# Patient Record
Sex: Female | Born: 1950 | Race: Black or African American | Hispanic: No
Health system: Southern US, Community
[De-identification: ages and names within clinical notes are randomized; demographics above are authoritative.]

## PROBLEM LIST (undated history)

## (undated) DIAGNOSIS — M1990 Unspecified osteoarthritis, unspecified site: Secondary | ICD-10-CM

## (undated) DIAGNOSIS — E119 Type 2 diabetes mellitus without complications: Secondary | ICD-10-CM

## (undated) DIAGNOSIS — D472 Monoclonal gammopathy: Secondary | ICD-10-CM

## (undated) DIAGNOSIS — R000 Tachycardia, unspecified: Secondary | ICD-10-CM

## (undated) DIAGNOSIS — D649 Anemia, unspecified: Secondary | ICD-10-CM

## (undated) DIAGNOSIS — U071 COVID-19: Secondary | ICD-10-CM

## (undated) DIAGNOSIS — K219 Gastro-esophageal reflux disease without esophagitis: Secondary | ICD-10-CM

## (undated) DIAGNOSIS — I10 Essential (primary) hypertension: Secondary | ICD-10-CM

## (undated) DIAGNOSIS — K589 Irritable bowel syndrome without diarrhea: Secondary | ICD-10-CM

## (undated) DIAGNOSIS — Z8719 Personal history of other diseases of the digestive system: Secondary | ICD-10-CM

## (undated) DIAGNOSIS — E7800 Pure hypercholesterolemia, unspecified: Secondary | ICD-10-CM

## (undated) DIAGNOSIS — H8390 Unspecified disease of inner ear, unspecified ear: Secondary | ICD-10-CM

## (undated) DIAGNOSIS — J449 Chronic obstructive pulmonary disease, unspecified: Secondary | ICD-10-CM

## (undated) DIAGNOSIS — M797 Fibromyalgia: Secondary | ICD-10-CM

## (undated) DIAGNOSIS — I4711 Inappropriate sinus tachycardia, so stated: Secondary | ICD-10-CM

## (undated) DIAGNOSIS — E0590 Thyrotoxicosis, unspecified without thyrotoxic crisis or storm: Secondary | ICD-10-CM

## (undated) DIAGNOSIS — I82403 Acute embolism and thrombosis of unspecified deep veins of lower extremity, bilateral: Secondary | ICD-10-CM

## (undated) DIAGNOSIS — Z9889 Other specified postprocedural states: Secondary | ICD-10-CM

## (undated) DIAGNOSIS — R112 Nausea with vomiting, unspecified: Secondary | ICD-10-CM

## (undated) HISTORY — DX: Inappropriate sinus tachycardia, so stated: I47.11

## (undated) HISTORY — PX: EYE SURGERY: SHX253

## (undated) HISTORY — PX: WRIST GANGLION EXCISION: SUR520

## (undated) HISTORY — DX: Tachycardia, unspecified: R00.0

## (undated) HISTORY — PX: DENTAL SURGERY: SHX609

## (undated) HISTORY — DX: COVID-19: U07.1

## (undated) HISTORY — PX: ABDOMINAL HYSTERECTOMY: SHX81

## (undated) HISTORY — DX: Type 2 diabetes mellitus without complications: E11.9

## (undated) HISTORY — DX: Acute embolism and thrombosis of unspecified deep veins of lower extremity, bilateral: I82.403

## (undated) HISTORY — DX: Monoclonal gammopathy: D47.2

---
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
## 1989-02-16 HISTORY — PX: CARPAL TUNNEL RELEASE: SHX101

## 2000-06-13 ENCOUNTER — Encounter: Payer: Self-pay | Admitting: Emergency Medicine

## 2000-06-13 ENCOUNTER — Emergency Department (HOSPITAL_COMMUNITY): Admission: EM | Admit: 2000-06-13 | Discharge: 2000-06-13 | Payer: Self-pay | Admitting: Emergency Medicine

## 2000-11-07 ENCOUNTER — Emergency Department (HOSPITAL_COMMUNITY): Admission: EM | Admit: 2000-11-07 | Discharge: 2000-11-07 | Payer: Self-pay | Admitting: *Deleted

## 2000-12-12 ENCOUNTER — Emergency Department (HOSPITAL_COMMUNITY): Admission: EM | Admit: 2000-12-12 | Discharge: 2000-12-12 | Payer: Self-pay | Admitting: Emergency Medicine

## 2000-12-12 ENCOUNTER — Encounter: Payer: Self-pay | Admitting: Emergency Medicine

## 2001-09-28 ENCOUNTER — Emergency Department (HOSPITAL_COMMUNITY): Admission: EM | Admit: 2001-09-28 | Discharge: 2001-09-28 | Payer: Self-pay | Admitting: Emergency Medicine

## 2002-02-21 ENCOUNTER — Emergency Department (HOSPITAL_COMMUNITY): Admission: EM | Admit: 2002-02-21 | Discharge: 2002-02-22 | Payer: Self-pay | Admitting: Emergency Medicine

## 2003-02-08 ENCOUNTER — Emergency Department (HOSPITAL_COMMUNITY): Admission: EM | Admit: 2003-02-08 | Discharge: 2003-02-08 | Payer: Self-pay | Admitting: Emergency Medicine

## 2003-04-09 ENCOUNTER — Emergency Department (HOSPITAL_COMMUNITY): Admission: EM | Admit: 2003-04-09 | Discharge: 2003-04-09 | Payer: Self-pay | Admitting: Emergency Medicine

## 2003-04-24 ENCOUNTER — Emergency Department (HOSPITAL_COMMUNITY): Admission: EM | Admit: 2003-04-24 | Discharge: 2003-04-24 | Payer: Self-pay | Admitting: Emergency Medicine

## 2004-02-11 ENCOUNTER — Emergency Department (HOSPITAL_COMMUNITY): Admission: EM | Admit: 2004-02-11 | Discharge: 2004-02-11 | Payer: Self-pay | Admitting: Emergency Medicine

## 2004-03-09 ENCOUNTER — Emergency Department (HOSPITAL_COMMUNITY): Admission: EM | Admit: 2004-03-09 | Discharge: 2004-03-09 | Payer: Self-pay | Admitting: Emergency Medicine

## 2004-04-19 ENCOUNTER — Emergency Department (HOSPITAL_COMMUNITY): Admission: EM | Admit: 2004-04-19 | Discharge: 2004-04-20 | Payer: Self-pay | Admitting: Emergency Medicine

## 2004-04-29 ENCOUNTER — Encounter (HOSPITAL_COMMUNITY): Admission: RE | Admit: 2004-04-29 | Discharge: 2004-05-29 | Payer: Self-pay | Admitting: Preventative Medicine

## 2004-11-17 ENCOUNTER — Emergency Department (HOSPITAL_COMMUNITY): Admission: EM | Admit: 2004-11-17 | Discharge: 2004-11-17 | Payer: Self-pay | Admitting: Emergency Medicine

## 2004-12-01 ENCOUNTER — Emergency Department (HOSPITAL_COMMUNITY): Admission: EM | Admit: 2004-12-01 | Discharge: 2004-12-01 | Payer: Self-pay | Admitting: Emergency Medicine

## 2005-03-03 ENCOUNTER — Ambulatory Visit (HOSPITAL_COMMUNITY): Admission: RE | Admit: 2005-03-03 | Discharge: 2005-03-03 | Payer: Self-pay | Admitting: Family Medicine

## 2006-01-27 ENCOUNTER — Emergency Department (HOSPITAL_COMMUNITY): Admission: EM | Admit: 2006-01-27 | Discharge: 2006-01-27 | Payer: Self-pay | Admitting: Emergency Medicine

## 2006-03-12 ENCOUNTER — Emergency Department (HOSPITAL_COMMUNITY): Admission: EM | Admit: 2006-03-12 | Discharge: 2006-03-12 | Payer: Self-pay | Admitting: Emergency Medicine

## 2007-02-09 ENCOUNTER — Emergency Department (HOSPITAL_COMMUNITY): Admission: EM | Admit: 2007-02-09 | Discharge: 2007-02-09 | Payer: Self-pay | Admitting: Emergency Medicine

## 2008-08-21 ENCOUNTER — Ambulatory Visit (HOSPITAL_COMMUNITY): Admission: RE | Admit: 2008-08-21 | Discharge: 2008-08-21 | Payer: Self-pay | Admitting: Family Medicine

## 2009-02-11 ENCOUNTER — Emergency Department (HOSPITAL_COMMUNITY): Admission: EM | Admit: 2009-02-11 | Discharge: 2009-02-11 | Payer: Self-pay | Admitting: Emergency Medicine

## 2009-11-29 ENCOUNTER — Emergency Department (HOSPITAL_COMMUNITY): Admission: EM | Admit: 2009-11-29 | Discharge: 2009-11-29 | Payer: Self-pay | Admitting: Emergency Medicine

## 2010-05-19 LAB — GLUCOSE, CAPILLARY: Glucose-Capillary: 215 mg/dL — ABNORMAL HIGH (ref 70–99)

## 2010-08-22 ENCOUNTER — Other Ambulatory Visit: Payer: Self-pay

## 2010-08-22 ENCOUNTER — Emergency Department (HOSPITAL_COMMUNITY): Payer: Self-pay

## 2010-08-22 ENCOUNTER — Emergency Department (HOSPITAL_COMMUNITY)
Admission: EM | Admit: 2010-08-22 | Discharge: 2010-08-22 | Disposition: A | Discharge status: Home / Self Care | Payer: Self-pay | Attending: Emergency Medicine | Admitting: Emergency Medicine

## 2010-08-22 DIAGNOSIS — N289 Disorder of kidney and ureter, unspecified: Secondary | ICD-10-CM | POA: Insufficient documentation

## 2010-08-22 DIAGNOSIS — R5381 Other malaise: Secondary | ICD-10-CM | POA: Insufficient documentation

## 2010-08-22 DIAGNOSIS — E119 Type 2 diabetes mellitus without complications: Secondary | ICD-10-CM | POA: Insufficient documentation

## 2010-08-22 DIAGNOSIS — I10 Essential (primary) hypertension: Secondary | ICD-10-CM | POA: Insufficient documentation

## 2010-08-22 DIAGNOSIS — Z794 Long term (current) use of insulin: Secondary | ICD-10-CM | POA: Insufficient documentation

## 2010-08-22 DIAGNOSIS — Z7982 Long term (current) use of aspirin: Secondary | ICD-10-CM | POA: Insufficient documentation

## 2010-08-22 DIAGNOSIS — Z79899 Other long term (current) drug therapy: Secondary | ICD-10-CM | POA: Insufficient documentation

## 2010-08-22 DIAGNOSIS — E860 Dehydration: Secondary | ICD-10-CM | POA: Insufficient documentation

## 2010-08-22 DIAGNOSIS — F17200 Nicotine dependence, unspecified, uncomplicated: Secondary | ICD-10-CM | POA: Insufficient documentation

## 2010-08-22 DIAGNOSIS — R5383 Other fatigue: Secondary | ICD-10-CM | POA: Insufficient documentation

## 2010-08-22 LAB — COMPREHENSIVE METABOLIC PANEL
ALT: 31 U/L (ref 0–35)
BUN: 26 mg/dL — ABNORMAL HIGH (ref 6–23)
CO2: 27 mEq/L (ref 19–32)
Calcium: 9 mg/dL (ref 8.4–10.5)
Creatinine, Ser: 1.41 mg/dL — ABNORMAL HIGH (ref 0.50–1.10)
GFR calc Af Amer: 46 mL/min — ABNORMAL LOW (ref >60)
GFR calc non Af Amer: 38 mL/min — ABNORMAL LOW (ref >60)
Glucose, Bld: 304 mg/dL — ABNORMAL HIGH (ref 70–99)

## 2010-08-22 LAB — CK TOTAL AND CKMB (NOT AT ARMC)
CK, MB: 3.3 ng/mL (ref 0.3–4.0)
Total CK: 139 U/L (ref 7–177)

## 2010-08-22 LAB — GLUCOSE, CAPILLARY: Glucose-Capillary: 397 mg/dL — ABNORMAL HIGH (ref 70–99)

## 2010-08-22 LAB — DIFFERENTIAL
Basophils Absolute: 0 10*3/uL (ref 0.0–0.1)
Basophils Relative: 0 % (ref 0–1)
Eosinophils Absolute: 0 10*3/uL (ref 0.0–0.7)
Monocytes Absolute: 0.6 10*3/uL (ref 0.1–1.0)
Monocytes Relative: 6 % (ref 3–12)

## 2010-08-22 LAB — CBC
MCH: 28.1 pg (ref 26.0–34.0)
MCHC: 33 g/dL (ref 30.0–36.0)
Platelets: 258 10*3/uL (ref 150–400)
RDW: 13.9 % (ref 11.5–15.5)

## 2010-09-19 ENCOUNTER — Encounter: Payer: Self-pay | Admitting: *Deleted

## 2010-09-19 ENCOUNTER — Emergency Department (HOSPITAL_COMMUNITY)
Admission: EM | Admit: 2010-09-19 | Discharge: 2010-09-19 | Disposition: A | Discharge status: Home / Self Care | Payer: Self-pay | Attending: Emergency Medicine | Admitting: Emergency Medicine

## 2010-09-19 DIAGNOSIS — E119 Type 2 diabetes mellitus without complications: Secondary | ICD-10-CM | POA: Insufficient documentation

## 2010-09-19 DIAGNOSIS — L02419 Cutaneous abscess of limb, unspecified: Secondary | ICD-10-CM

## 2010-09-19 DIAGNOSIS — J45909 Unspecified asthma, uncomplicated: Secondary | ICD-10-CM | POA: Insufficient documentation

## 2010-09-19 DIAGNOSIS — F17200 Nicotine dependence, unspecified, uncomplicated: Secondary | ICD-10-CM | POA: Insufficient documentation

## 2010-09-19 DIAGNOSIS — IMO0001 Reserved for inherently not codable concepts without codable children: Secondary | ICD-10-CM | POA: Insufficient documentation

## 2010-09-19 DIAGNOSIS — IMO0002 Reserved for concepts with insufficient information to code with codable children: Secondary | ICD-10-CM | POA: Insufficient documentation

## 2010-09-19 DIAGNOSIS — I10 Essential (primary) hypertension: Secondary | ICD-10-CM | POA: Insufficient documentation

## 2010-09-19 HISTORY — DX: Essential (primary) hypertension: I10

## 2010-09-19 HISTORY — DX: Fibromyalgia: M79.7

## 2010-09-19 MED ORDER — HYDROCODONE-ACETAMINOPHEN 7.5-325 MG PO TABS: 1.0000 | ORAL_TABLET | ORAL | Status: AC | PRN

## 2010-09-19 MED ORDER — CLINDAMYCIN HCL 150 MG PO CAPS: ORAL_CAPSULE | ORAL | Status: DC

## 2010-09-19 MED ORDER — LIDOCAINE HCL 2 % IJ SOLN
10.0000 mL | Freq: Once | INTRAMUSCULAR | Status: AC
  Administered 2010-09-19: 200 mg
  Filled 2010-09-19: qty 1

## 2010-09-19 NOTE — ED Notes (Signed)
Pt a/ox4. Resp even and unlabored. NAD at this time. D/C instructions reviewed with pt. Pt verbalized understanding. Pt escorted to d/c desk. Pt ambulated with steady gate. 

## 2010-09-19 NOTE — ED Provider Notes (Signed)
History     CSN: [id number] Arrival date & time: 09/19/2010  5:02 PM  Chief Complaint  Patient presents with  . Abscess   Patient is a 60 y.o. female presenting with abscess. The history is provided by the patient.  Abscess  This is a new problem. The current episode started more than one week ago. The problem has been gradually worsening. Affected Location: right elbow. The problem is moderate. The abscess is characterized by painfulness. Associated symptoms include decreased sleep. Pertinent negatives include no anorexia, no fever and no cough. There were no sick contacts.    Past Medical History  Diagnosis Date  . Diabetes mellitus   . Hypertension   . Fibromyalgia   . Asthma     Past Surgical History  Procedure Date  . Cholecystectomy   . Abdominal hysterectomy partial    No family history on file.  History  Substance Use Topics  . Smoking status: Current Everyday Smoker  . Smokeless tobacco: Not on file  . Alcohol Use: No    OB History    Grav Para Term Preterm Abortions TAB SAB Ect Mult Living                  Review of Systems  Constitutional: Negative for fever and activity change.       All ROS Neg except as noted in HPI  HENT: Negative for nosebleeds and neck pain.   Eyes: Negative for photophobia and discharge.  Respiratory: Negative for cough, shortness of breath and wheezing.   Cardiovascular: Negative for chest pain and palpitations.  Gastrointestinal: Negative for abdominal pain, blood in stool and anorexia.  Genitourinary: Negative for dysuria, frequency and hematuria.  Musculoskeletal: Negative for back pain and arthralgias.  Skin: Negative.   Neurological: Negative for dizziness, seizures and speech difficulty.  Psychiatric/Behavioral: Negative for hallucinations and confusion.    Physical Exam  BP 114/78  Pulse 104  Temp(Src) 98.4 F (36.9 C) (Oral)  Resp 20  Ht 5' 8.5" (1.74 m)  Wt 224 lb (101.606 kg)  BMI 33.56 kg/m2  SpO2  100%  Physical Exam  Nursing note and vitals reviewed. Constitutional: She is oriented to person, place, and time. She appears well-developed and well-nourished.  Non-toxic appearance.  HENT:  Head: Normocephalic.  Right Ear: Tympanic membrane and external ear normal.  Left Ear: Tympanic membrane and external ear normal.  Eyes: EOM and lids are normal. Pupils are equal, round, and reactive to light.  Neck: Normal range of motion. Neck supple. Carotid bruit is not present.  Cardiovascular: Normal rate, regular rhythm, normal heart sounds, intact distal pulses and normal pulses.   Pulmonary/Chest: Breath sounds normal. No respiratory distress.  Abdominal: Soft. Bowel sounds are normal. There is no tenderness. There is no guarding.  Musculoskeletal:       Abscess of the right forearm/elbow area. Mild to mod increase redness. Warm but not hot. FROM of the finger and wrist.. Good cap refill.  Lymphadenopathy:       Head (right side): No submandibular adenopathy present.       Head (left side): No submandibular adenopathy present.    She has no cervical adenopathy.  Neurological: She is alert and oriented to person, place, and time. She has normal strength. No cranial nerve deficit or sensory deficit.  Skin: Skin is warm and dry.  Psychiatric: She has a normal mood and affect. Her speech is normal.    ED Course  Apiration of blood/fluid Date/Time: 09/19/2010 6:03 PM  Performed by: Kathie Dike Authorized by: Gerhard Munch Consent: Verbal consent obtained. Consent given by: patient Patient understanding: patient states understanding of the procedure being performed Patient identity confirmed: arm band Time out: Immediately prior to procedure a "time out" was called to verify the correct patient, procedure, equipment, support staff and site/side marked as required. Preparation: Patient was prepped and draped in the usual sterile fashion. Local anesthesia used: yes Anesthesia: local  infiltration Local anesthetic: lidocaine 2% without epinephrine Patient tolerance: Patient tolerated the procedure well with no immediate complications. Comments: The abscess area of the right elbow was aspirated. No pus-like material noted. Sterile bandage applied by me. Antibiotics, NSAId and pain meds ordered.    MDM I have reviewed nursing notes, vital signs, and all appropriate lab and imaging results for this patient.      Kathie Dike, PA 09/19/10 1728  Kathie Dike, [place] 09/19/10 [phone]

## 2010-09-19 NOTE — ED Notes (Signed)
C/o abscess to right elbow x 1 1/2 wks.  States area "popped and drained" x 2 days ago.  Since, experiencing increased throbbing pain to area.

## 2010-11-21 LAB — URINE MICROSCOPIC-ADD ON

## 2010-11-21 LAB — URINALYSIS, ROUTINE W REFLEX MICROSCOPIC
Bilirubin Urine: NEGATIVE
Leukocytes, UA: NEGATIVE
Nitrite: NEGATIVE
Specific Gravity, Urine: 1.02
Urobilinogen, UA: 0.2

## 2011-10-28 ENCOUNTER — Other Ambulatory Visit (HOSPITAL_COMMUNITY): Payer: Self-pay | Admitting: Family Medicine

## 2011-10-28 DIAGNOSIS — Z1231 Encounter for screening mammogram for malignant neoplasm of breast: Secondary | ICD-10-CM

## 2011-10-29 ENCOUNTER — Other Ambulatory Visit (HOSPITAL_COMMUNITY): Payer: Self-pay | Admitting: Family Medicine

## 2011-10-29 DIAGNOSIS — Z1231 Encounter for screening mammogram for malignant neoplasm of breast: Secondary | ICD-10-CM

## 2011-11-04 ENCOUNTER — Ambulatory Visit (HOSPITAL_COMMUNITY): Payer: Self-pay

## 2011-11-09 ENCOUNTER — Ambulatory Visit (HOSPITAL_COMMUNITY)
Admission: RE | Admit: 2011-11-09 | Discharge: 2011-11-09 | Disposition: A | Discharge status: Home / Self Care | Payer: Self-pay | Source: Ambulatory Visit | Attending: Family Medicine | Admitting: Family Medicine

## 2011-11-09 DIAGNOSIS — Z1231 Encounter for screening mammogram for malignant neoplasm of breast: Secondary | ICD-10-CM

## 2011-11-17 ENCOUNTER — Other Ambulatory Visit: Payer: Self-pay | Admitting: Family Medicine

## 2011-11-17 DIAGNOSIS — R928 Other abnormal and inconclusive findings on diagnostic imaging of breast: Secondary | ICD-10-CM

## 2011-11-24 ENCOUNTER — Other Ambulatory Visit (HOSPITAL_COMMUNITY): Payer: Self-pay | Admitting: Nurse Practitioner

## 2011-12-09 ENCOUNTER — Ambulatory Visit (HOSPITAL_COMMUNITY)
Admission: RE | Admit: 2011-12-09 | Discharge: 2011-12-09 | Disposition: A | Discharge status: Home / Self Care | Payer: PRIVATE HEALTH INSURANCE | Source: Ambulatory Visit | Attending: Nurse Practitioner | Admitting: Nurse Practitioner

## 2011-12-09 DIAGNOSIS — R928 Other abnormal and inconclusive findings on diagnostic imaging of breast: Secondary | ICD-10-CM | POA: Insufficient documentation

## 2012-08-25 ENCOUNTER — Other Ambulatory Visit (HOSPITAL_COMMUNITY): Payer: Self-pay | Admitting: Family Medicine

## 2012-08-25 ENCOUNTER — Ambulatory Visit (HOSPITAL_COMMUNITY)
Admission: RE | Admit: 2012-08-25 | Discharge: 2012-08-25 | Disposition: A | Discharge status: Home / Self Care | Payer: Disability Insurance | Source: Ambulatory Visit | Attending: Family Medicine | Admitting: Family Medicine

## 2012-08-25 DIAGNOSIS — M25512 Pain in left shoulder: Secondary | ICD-10-CM

## 2012-08-25 DIAGNOSIS — M25511 Pain in right shoulder: Secondary | ICD-10-CM

## 2012-08-25 DIAGNOSIS — M545 Low back pain: Secondary | ICD-10-CM

## 2012-08-25 DIAGNOSIS — M25519 Pain in unspecified shoulder: Secondary | ICD-10-CM | POA: Insufficient documentation

## 2012-08-25 DIAGNOSIS — M898X9 Other specified disorders of bone, unspecified site: Secondary | ICD-10-CM | POA: Insufficient documentation

## 2013-01-20 ENCOUNTER — Encounter (HOSPITAL_COMMUNITY): Payer: Self-pay | Admitting: Emergency Medicine

## 2013-01-20 ENCOUNTER — Emergency Department (HOSPITAL_COMMUNITY)
Admission: EM | Admit: 2013-01-20 | Discharge: 2013-01-21 | Disposition: A | Discharge status: Home / Self Care | Payer: BC Managed Care – PPO | Attending: Emergency Medicine | Admitting: Emergency Medicine

## 2013-01-20 DIAGNOSIS — J45909 Unspecified asthma, uncomplicated: Secondary | ICD-10-CM | POA: Insufficient documentation

## 2013-01-20 DIAGNOSIS — Z880 Allergy status to penicillin: Secondary | ICD-10-CM | POA: Insufficient documentation

## 2013-01-20 DIAGNOSIS — Z79899 Other long term (current) drug therapy: Secondary | ICD-10-CM | POA: Insufficient documentation

## 2013-01-20 DIAGNOSIS — Z7982 Long term (current) use of aspirin: Secondary | ICD-10-CM | POA: Insufficient documentation

## 2013-01-20 DIAGNOSIS — K1121 Acute sialoadenitis: Secondary | ICD-10-CM

## 2013-01-20 DIAGNOSIS — N289 Disorder of kidney and ureter, unspecified: Secondary | ICD-10-CM

## 2013-01-20 DIAGNOSIS — E119 Type 2 diabetes mellitus without complications: Secondary | ICD-10-CM | POA: Insufficient documentation

## 2013-01-20 DIAGNOSIS — D649 Anemia, unspecified: Secondary | ICD-10-CM

## 2013-01-20 DIAGNOSIS — K1120 Sialoadenitis, unspecified: Secondary | ICD-10-CM | POA: Insufficient documentation

## 2013-01-20 DIAGNOSIS — Z87891 Personal history of nicotine dependence: Secondary | ICD-10-CM | POA: Insufficient documentation

## 2013-01-20 DIAGNOSIS — E871 Hypo-osmolality and hyponatremia: Secondary | ICD-10-CM | POA: Insufficient documentation

## 2013-01-20 DIAGNOSIS — I10 Essential (primary) hypertension: Secondary | ICD-10-CM | POA: Insufficient documentation

## 2013-01-20 DIAGNOSIS — Z794 Long term (current) use of insulin: Secondary | ICD-10-CM | POA: Insufficient documentation

## 2013-01-20 NOTE — ED Notes (Signed)
Pt c/o left side of face swelling since yesterday. Pt states getting worse today and has gotten really painful in the last hour.

## 2013-01-21 ENCOUNTER — Emergency Department (HOSPITAL_COMMUNITY): Payer: BC Managed Care – PPO

## 2013-01-21 LAB — BASIC METABOLIC PANEL
Calcium: 9.5 mg/dL (ref 8.4–10.5)
GFR calc Af Amer: 39 mL/min — ABNORMAL LOW (ref >90)
GFR calc non Af Amer: 33 mL/min — ABNORMAL LOW (ref >90)
Sodium: 130 mEq/L — ABNORMAL LOW (ref 135–145)

## 2013-01-21 LAB — CBC WITH DIFFERENTIAL/PLATELET
Basophils Absolute: 0 10*3/uL (ref 0.0–0.1)
Basophils Relative: 0 % (ref 0–1)
Eosinophils Absolute: 0.3 10*3/uL (ref 0.0–0.7)
Eosinophils Relative: 3 % (ref 0–5)
Lymphocytes Relative: 21 % (ref 12–46)
MCH: 27.1 pg (ref 26.0–34.0)
MCHC: 32.1 g/dL (ref 30.0–36.0)
MCV: 84.7 fL (ref 78.0–100.0)
Platelets: 253 10*3/uL (ref 150–400)
RDW: 14.3 % (ref 11.5–15.5)
WBC: 10.9 10*3/uL — ABNORMAL HIGH (ref 4.0–10.5)

## 2013-01-21 MED ORDER — CLINDAMYCIN HCL 150 MG PO CAPS: 300.0000 mg | ORAL_CAPSULE | Freq: Three times a day (TID) | ORAL | Status: DC

## 2013-01-21 MED ORDER — ONDANSETRON HCL 4 MG/2ML IJ SOLN
4.0000 mg | Freq: Once | INTRAMUSCULAR | Status: AC
  Administered 2013-01-21: 4 mg via INTRAVENOUS
  Filled 2013-01-21: qty 2

## 2013-01-21 MED ORDER — IOHEXOL 300 MG/ML  SOLN
100.0000 mL | Freq: Once | INTRAMUSCULAR | Status: AC | PRN
  Administered 2013-01-21: 100 mL via INTRAVENOUS

## 2013-01-21 MED ORDER — CLINDAMYCIN PHOSPHATE 600 MG/50ML IV SOLN
600.0000 mg | Freq: Once | INTRAVENOUS | Status: AC
  Administered 2013-01-21: 600 mg via INTRAVENOUS
  Filled 2013-01-21: qty 50

## 2013-01-21 MED ORDER — HYDROMORPHONE HCL PF 1 MG/ML IJ SOLN
1.0000 mg | Freq: Once | INTRAMUSCULAR | Status: AC
  Administered 2013-01-21: 1 mg via INTRAVENOUS
  Filled 2013-01-21: qty 1

## 2013-01-21 MED ORDER — OXYCODONE-ACETAMINOPHEN 5-325 MG PO TABS: 1.0000 | ORAL_TABLET | ORAL | Status: DC | PRN

## 2013-01-21 NOTE — ED Provider Notes (Signed)
CSN: [id number]     Arrival date & time 01/20/13  2300 History   First MD Initiated Contact with Patient 01/21/13 0031     Chief Complaint  Patient presents with  . Facial Swelling   (Consider location/radiation/quality/duration/timing/severity/associated sxs/prior Treatment) The history is provided by the patient.   62 year old female noted swelling and the left angle of the mandible yesterday which got worse today and became very painful today. She rates pain at 10/10. Is worse with trying to open her mouth or chew and worse with palpation. Nothing makes it better. She took a dose of hydrocodone-acetaminophen with no relief of pain. She denies fever, chills, sweats. She denies sore throat, nausea, vomiting, diarrhea, arthralgias, myalgias.  Past Medical History  Diagnosis Date  . Diabetes mellitus   . Hypertension   . Fibromyalgia   . Asthma    Past Surgical History  Procedure Laterality Date  . Cholecystectomy    . Abdominal hysterectomy  partial   History reviewed. No pertinent family history. History  Substance Use Topics  . Smoking status: Former Research scientist (life sciences)  . Smokeless tobacco: Not on file  . Alcohol Use: No   OB History   Grav Para Term Preterm Abortions TAB SAB Ect Mult Living                 Review of Systems  All other systems reviewed and are negative.    Allergies  Tetracyclines & related and Penicillins  Home Medications   Current Outpatient Rx  Name  Route  Sig  Dispense  Refill  . Albuterol Sulfate (PROVENTIL HFA IN)   Inhalation   Inhale 2 puffs into the lungs daily.           Marland Kitchen aspirin EC 81 MG tablet   Oral   Take 81 mg by mouth daily.           . Bimatoprost (LUMIGAN OP)   Ophthalmic   Apply 1 drop to eye at bedtime.           . Calcium Acetate, Phos Binder, (CALCIUM ACETATE PO)   Oral   Take 1 tablet by mouth daily.           . Cholecalciferol (VITAMIN D) 1000 UNITS capsule   Oral   Take 1,000 Units by mouth daily.            . clindamycin (CLEOCIN) 150 MG capsule      2 po bid with food   28 capsule   0   . Cyanocobalamin (VITAMIN B-12 PO)   Oral   Take 1 tablet by mouth daily.           Marland Kitchen gabapentin (NEURONTIN) 300 MG capsule   Oral   Take 300 mg by mouth 3 (three) times daily.           Marland Kitchen ibuprofen (ADVIL,MOTRIN) 800 MG tablet   Oral   Take 800 mg by mouth every 6 (six) hours as needed.           . insulin glargine (LANTUS) 100 UNIT/ML injection   Subcutaneous   Inject 26 Units into the skin at bedtime.           . lidocaine (LIDODERM) 5 %   Transdermal   Place 1 patch onto the skin daily. Remove & Discard patch within 12 hours or as directed by MD          . metFORMIN (GLUCOPHAGE) 1000 MG tablet   Oral  Take 1,000 mg by mouth 2 (two) times daily with a meal.           . Multiple Vitamin (MULTIVITAMIN PO)   Oral   Take 1 tablet by mouth daily.           Marland Kitchen PRESCRIPTION MEDICATION   Oral   Take 1 tablet by mouth daily. BLOOD PRESSURE MEDICATION: Owens Shark Tablet/NAME UNKNOWN          . traMADol (ULTRAM) 50 MG tablet   Oral   Take 100 mg by mouth every 4 (four) hours.           Marland Kitchen VITAMIN E BLEND PO   Oral   Take 1 capsule by mouth daily.            BP 148/76  Pulse 112  Temp(Src) 98 F (36.7 C) (Oral)  Resp 26  Ht 5' 8.5" (1.74 m)  Wt 225 lb (102.059 kg)  BMI 33.71 kg/m2  SpO2 100% Physical Exam  Nursing note and vitals reviewed.  62 year old female, who appears uncomfortable, but is in no acute distress. Vital signs are significant for tachycardia with heart rate 112, and tachypnea with respiratory rate of 26. Oxygen saturation is 100%, which is normal. Head is normocephalic and atraumatic. PERRLA, EOMI. Oropharynx is clear. There is soft tissue swelling at the left angle of the mandible with 5 cm x 3.5 cm mass which is exquisitely tender consistent with a parotid abscess. Neck is nontender and supple without adenopathy or JVD. Back is nontender and there  is no CVA tenderness. Lungs are clear without rales, wheezes, or rhonchi. Chest is nontender. Heart has regular rate and rhythm without murmur. Abdomen is soft, flat, nontender without masses or hepatosplenomegaly and peristalsis is normoactive. Extremities have no cyanosis or edema, full range of motion is present. Skin is warm and dry without rash. Neurologic: Mental status is normal, cranial nerves are intact, there are no motor or sensory deficits.  ED Course  Procedures (including critical care time) Labs Review Results for orders placed during the hospital encounter of 01/20/13  CBC WITH DIFFERENTIAL      Result Value Range   WBC 10.9 (*) 4.0 - 10.5 K/uL   RBC 4.31  3.87 - 5.11 MIL/uL   Hemoglobin 11.7 (*) 12.0 - 15.0 g/dL   HCT 36.5  36.0 - 46.0 %   MCV 84.7  78.0 - 100.0 fL   MCH 27.1  26.0 - 34.0 pg   MCHC 32.1  30.0 - 36.0 g/dL   RDW 14.3  11.5 - 15.5 %   Platelets 253  150 - 400 K/uL   Neutrophils Relative % 69  43 - 77 %   Neutro Abs 7.6  1.7 - 7.7 K/uL   Lymphocytes Relative 21  12 - 46 %   Lymphs Abs 2.3  0.7 - 4.0 K/uL   Monocytes Relative 7  3 - 12 %   Monocytes Absolute 0.7  0.1 - 1.0 K/uL   Eosinophils Relative 3  0 - 5 %   Eosinophils Absolute 0.3  0.0 - 0.7 K/uL   Basophils Relative 0  0 - 1 %   Basophils Absolute 0.0  0.0 - 0.1 K/uL  BASIC METABOLIC PANEL      Result Value Range   Sodium 130 (*) 135 - 145 mEq/L   Potassium 5.0  3.5 - 5.1 mEq/L   Chloride 97  96 - 112 mEq/L   CO2 23  19 - 32  mEq/L   Glucose, Bld 386 (*) 70 - 99 mg/dL   BUN 27 (*) 6 - 23 mg/dL   Creatinine, Ser 1.60 (*) 0.50 - 1.10 mg/dL   Calcium 9.5  8.4 - 10.5 mg/dL   GFR calc non Af Amer 33 (*) >90 mL/min   GFR calc Af Amer 39 (*) >90 mL/min   Imaging Review Ct Maxillofacial W/cm  01/21/2013   CLINICAL DATA:  Left-sided facial swelling and pain.  EXAM: CT MAXILLOFACIAL WITH CONTRAST  TECHNIQUE: Multidetector CT imaging of the maxillofacial structures was performed with  intravenous contrast. Multiplanar CT image reconstructions were also generated. A small metallic BB was placed on the right temple in order to reliably differentiate right from left.  CONTRAST:  172m OMNIPAQUE IOHEXOL 300 MG/ML  SOLN  COMPARISON:  CT of the head performed 11/29/2009  FINDINGS: There is no evidence of fracture or dislocation. The maxilla and mandible appear intact. The nasal bone is unremarkable in appearance. The visualized dentition demonstrates no acute abnormality. 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 Facet disease is noted along the upper cervical spine, on the right side.  The orbits are intact bilaterally. The visualized paranasal sinuses and mastoid air cells are well-aerated.  No significant soft tissue abnormalities are seen. The parapharyngeal fat planes are preserved. The nasopharynx, oropharynx and hypopharynx are unremarkable in appearance. The visualized portions of the valleculae and piriform sinuses are grossly unremarkable. No abnormal focal contrast enhancement is identified.  The parotid and submandibular glands are within normal limits; slight asymmetry of the submandibular glands, left larger than right, remains within normal limits. No cervical lymphadenopathy is seen. The visualized portions of the brain are unremarkable in appearance. Minimal nonspecific calcification is noted within the right thyroid lobe.  IMPRESSION: 1. No evidence of fracture or dislocation. 2. No focal soft tissue findings seen on CT to correspond to clinically described findings.   Electronically Signed   By: JGarald BaldingM.D.   On: 01/21/2013 02:19   Images viewed by me.  MDM   1. Parotitis, acute   2. Renal insufficiency   3. Hyponatremia   4. Anemia    Probable left parotid abscess. She will be sent for CT scan to evaluate this and will probably need ENT referral. She is given hydromorphone and ondansetron for pain.  She got reasonable relief of pain  with above noted medication but pain is starting to come back. CT does not show any definite evidence of abscess formation. I am still suspicious that this is an early abscess. She's given a dose of gentamicin in the emergency department and will be sent home with prescriptions for clindamycin and oxycodone acetaminophen and will be referred to ENT for followup.  DDelora Fuel MD [id number]

## 2013-05-04 ENCOUNTER — Encounter (HOSPITAL_COMMUNITY): Payer: Self-pay

## 2013-05-04 ENCOUNTER — Encounter (HOSPITAL_COMMUNITY)
Admission: RE | Admit: 2013-05-04 | Discharge: 2013-05-04 | Disposition: A | Discharge status: Home / Self Care | Payer: BC Managed Care – PPO | Source: Ambulatory Visit | Attending: Ophthalmology | Admitting: Ophthalmology

## 2013-05-04 DIAGNOSIS — Z01812 Encounter for preprocedural laboratory examination: Secondary | ICD-10-CM | POA: Insufficient documentation

## 2013-05-04 DIAGNOSIS — Z01810 Encounter for preprocedural cardiovascular examination: Secondary | ICD-10-CM | POA: Insufficient documentation

## 2013-05-04 HISTORY — DX: Chronic obstructive pulmonary disease, unspecified: J44.9

## 2013-05-04 HISTORY — DX: Gastro-esophageal reflux disease without esophagitis: K21.9

## 2013-05-04 HISTORY — DX: Personal history of other diseases of the digestive system: Z87.19

## 2013-05-04 HISTORY — DX: Anemia, unspecified: D64.9

## 2013-05-04 HISTORY — DX: Thyrotoxicosis, unspecified without thyrotoxic crisis or storm: E05.90

## 2013-05-04 HISTORY — DX: Other specified postprocedural states: Z98.890

## 2013-05-04 HISTORY — DX: Nausea with vomiting, unspecified: R11.2

## 2013-05-04 LAB — BASIC METABOLIC PANEL
BUN: 25 mg/dL — ABNORMAL HIGH (ref 6–23)
CHLORIDE: 101 meq/L (ref 96–112)
CO2: 26 meq/L (ref 19–32)
Calcium: 9.2 mg/dL (ref 8.4–10.5)
Creatinine, Ser: 1 mg/dL (ref 0.50–1.10)
GFR calc Af Amer: 68 mL/min — ABNORMAL LOW (ref >90)
GFR, EST NON AFRICAN AMERICAN: 59 mL/min — AB (ref >90)
GLUCOSE: 300 mg/dL — AB (ref 70–99)
POTASSIUM: 4.6 meq/L (ref 3.7–5.3)
SODIUM: 138 meq/L (ref 137–147)

## 2013-05-04 LAB — HEMOGLOBIN AND HEMATOCRIT, BLOOD
HCT: 34.6 % — ABNORMAL LOW (ref 36.0–46.0)
Hemoglobin: 11.2 g/dL — ABNORMAL LOW (ref 12.0–15.0)

## 2013-05-04 NOTE — Patient Instructions (Addendum)
Brooklen Runquist Amendola  05/04/2013   Your procedure is scheduled on:  05/08/13  Report to [facility] at 0830 AM.  Call this number if you have problems the morning of surgery: [phone]   Remember:   Do not eat food or drink liquids after midnight.   Take these medicines the morning of surgery with A SIP OF WATER:  pain pill, Lisinopril, Metoprolol, Lyrica.    Only take 1/2 Lantus dose night before procedure.  Use your albuterol inhaler & bring it with you.   Do not wear jewelry, make-up or nail polish.  Do not wear lotions, powders, or perfumes. You may wear deodorant.  Do not shave 48 hours prior to surgery. Men may shave face and neck.  Do not bring valuables to the hospital.  [facility] is not responsible                  for any belongings or valuables.               Contacts, dentures or bridgework may not be worn into surgery.  Leave suitcase in the car. After surgery it may be brought to your room.  For patients admitted to the hospital, discharge time is determined by your                treatment team.               Patients discharged the day of surgery will not be allowed to drive  home.  Name and phone number of your driver: family  Special Instructions: N/A   Please read over the following fact sheets that you were given: Anesthesia Post-op Instructions and Care and Recovery After Surgery   PATIENT INSTRUCTIONS POST-ANESTHESIA  IMMEDIATELY FOLLOWING SURGERY:  Do not drive or operate machinery for the first twenty four hours after surgery.  Do not make any important decisions for twenty four hours after surgery or while taking narcotic pain medications or sedatives.  If you develop intractable nausea and vomiting or a severe headache please notify your doctor immediately.  FOLLOW-UP:  Please make an appointment with your surgeon as instructed. You do not need to follow up with anesthesia unless specifically instructed to do so.  WOUND CARE INSTRUCTIONS (if applicable):  Keep a  dry clean dressing on the anesthesia/puncture wound site if there is drainage.  Once the wound has quit draining you may leave it open to air.  Generally you should leave the bandage intact for twenty four hours unless there is drainage.  If the epidural site drains for more than 36-48 hours please call the anesthesia department.  QUESTIONS?:  Please feel free to call your physician or the hospital operator if you have any questions, and they will be happy to assist you.      Cataract Surgery  A cataract is a clouding of the lens of the eye. When a lens becomes cloudy, vision is reduced based on the degree and nature of the clouding. Surgery may be needed to improve vision. Surgery removes the cloudy lens and usually replaces it with a substitute lens (intraocular lens, IOL). LET YOUR EYE DOCTOR KNOW ABOUT:  Allergies to food or medicine.  Medicines taken including herbs, eyedrops, over-the-counter medicines, and creams.  Use of steroids (by mouth or creams).  Previous problems with anesthetics or numbing medicine.  History of bleeding problems or blood clots.  Previous surgery.  Other health problems, including diabetes and kidney problems.  Possibility of pregnancy,  if this applies. RISKS AND COMPLICATIONS  Infection.  Inflammation of the eyeball (endophthalmitis) that can spread to both eyes (sympathetic ophthalmia).  Poor wound healing.  If an IOL is inserted, it can later fall out of proper position. This is very uncommon.  Clouding of the part of your eye that holds an IOL in place. This is called an "after-cataract." These are uncommon, but easily treated. BEFORE THE PROCEDURE  Do not eat or drink anything except small amounts of water for 8 to 12 before your surgery, or as directed by your caregiver.  Unless you are told otherwise, continue any eyedrops you have been prescribed.  Talk to your primary caregiver about all other medicines that you take (both  prescription and non-prescription). In some cases, you may need to stop or change medicines near the time of your surgery. This is most important if you are taking blood-thinning medicine.Do not stop medicines unless you are told to do so.  Arrange for someone to drive you to and from the procedure.  Do not put contact lenses in either eye on the day of your surgery. PROCEDURE There is more than one method for safely removing a cataract. Your doctor can explain the differences and help determine which is best for you. Phacoemulsification surgery is the most common form of cataract surgery.  An injection is given behind the eye or eyedrops are given to make this a painless procedure.  A small cut (incision) is made on the edge of the clear, dome-shaped surface that covers the front of the eye (cornea).  A tiny probe is painlessly inserted into the eye. This device gives off ultrasound waves that soften and break up the cloudy center of the lens. This makes it easier for the cloudy lens to be removed by suction.  An IOL may be implanted.  The normal lens of the eye is covered by a clear capsule. Part of that capsule is intentionally left in the eye to support the IOL.  Your surgeon may or may not use stitches to close the incision. There are other forms of cataract surgery that require a larger incision and stiches to close the eye. This approach is taken in cases where the doctor feels that the cataract cannot be easily removed using phacoemulsification. AFTER THE PROCEDURE  When an IOL is implanted, it does not need care. It becomes a permanent part of your eye and cannot be seen or felt.  Your doctor will schedule follow-up exams to check on your progress.  Review your other medicines with your doctor to see which can be resumed after surgery.  Use eyedrops or take medicine as prescribed by your doctor. Document Released: 01/22/2011 Document Revised: 04/27/2011 Document Reviewed:  01/22/2011 [facility] Patient Information 2014 [place], [place].

## 2013-05-05 MED ORDER — TETRACAINE HCL 0.5 % OP SOLN
OPHTHALMIC | Status: AC
  Filled 2013-05-05: qty 2

## 2013-05-05 MED ORDER — NEOMYCIN-POLYMYXIN-DEXAMETH 3.5-10000-0.1 OP SUSP
OPHTHALMIC | Status: AC
  Filled 2013-05-05: qty 5

## 2013-05-05 MED ORDER — PHENYLEPHRINE HCL 2.5 % OP SOLN
OPHTHALMIC | Status: AC
  Filled 2013-05-05: qty 15

## 2013-05-05 MED ORDER — LIDOCAINE HCL 3.5 % OP GEL
OPHTHALMIC | Status: AC
  Filled 2013-05-05: qty 1

## 2013-05-05 MED ORDER — CYCLOPENTOLATE-PHENYLEPHRINE OP SOLN OPTIME - NO CHARGE
OPHTHALMIC | Status: AC
  Filled 2013-05-05: qty 2

## 2013-05-05 MED ORDER — LIDOCAINE HCL (PF) 1 % IJ SOLN
INTRAMUSCULAR | Status: AC
  Filled 2013-05-05: qty 2

## 2013-05-08 ENCOUNTER — Ambulatory Visit (HOSPITAL_COMMUNITY)
Admission: RE | Admit: 2013-05-08 | Discharge: 2013-05-08 | Disposition: A | Discharge status: Home / Self Care | Payer: BC Managed Care – PPO | Source: Ambulatory Visit | Attending: Ophthalmology | Admitting: Ophthalmology

## 2013-05-08 ENCOUNTER — Ambulatory Visit (HOSPITAL_COMMUNITY): Payer: BC Managed Care – PPO | Admitting: Anesthesiology

## 2013-05-08 ENCOUNTER — Encounter (HOSPITAL_COMMUNITY)
Admission: RE | Disposition: A | Discharge status: Home / Self Care | Payer: Self-pay | Source: Ambulatory Visit | Attending: Ophthalmology

## 2013-05-08 ENCOUNTER — Encounter (HOSPITAL_COMMUNITY): Payer: Self-pay | Admitting: *Deleted

## 2013-05-08 ENCOUNTER — Encounter (HOSPITAL_COMMUNITY): Payer: BC Managed Care – PPO | Admitting: Anesthesiology

## 2013-05-08 DIAGNOSIS — E119 Type 2 diabetes mellitus without complications: Secondary | ICD-10-CM | POA: Insufficient documentation

## 2013-05-08 DIAGNOSIS — J4489 Other specified chronic obstructive pulmonary disease: Secondary | ICD-10-CM | POA: Insufficient documentation

## 2013-05-08 DIAGNOSIS — I10 Essential (primary) hypertension: Secondary | ICD-10-CM | POA: Insufficient documentation

## 2013-05-08 DIAGNOSIS — H2589 Other age-related cataract: Secondary | ICD-10-CM | POA: Insufficient documentation

## 2013-05-08 DIAGNOSIS — J449 Chronic obstructive pulmonary disease, unspecified: Secondary | ICD-10-CM | POA: Insufficient documentation

## 2013-05-08 DIAGNOSIS — F17200 Nicotine dependence, unspecified, uncomplicated: Secondary | ICD-10-CM | POA: Insufficient documentation

## 2013-05-08 DIAGNOSIS — Z794 Long term (current) use of insulin: Secondary | ICD-10-CM | POA: Insufficient documentation

## 2013-05-08 DIAGNOSIS — Z79899 Other long term (current) drug therapy: Secondary | ICD-10-CM | POA: Insufficient documentation

## 2013-05-08 HISTORY — PX: CATARACT EXTRACTION W/PHACO: SHX586

## 2013-05-08 LAB — GLUCOSE, CAPILLARY: GLUCOSE-CAPILLARY: 293 mg/dL — AB (ref 70–99)

## 2013-05-08 SURGERY — PHACOEMULSIFICATION, CATARACT, WITH IOL INSERTION
Anesthesia: Monitor Anesthesia Care | Site: Eye | Laterality: Right

## 2013-05-08 MED ORDER — FENTANYL CITRATE 0.05 MG/ML IJ SOLN: 25.0000 ug | INTRAMUSCULAR | Status: DC | PRN

## 2013-05-08 MED ORDER — LIDOCAINE HCL (PF) 1 % IJ SOLN
INTRAOCULAR | Status: DC | PRN
  Administered 2013-05-08: 10:00:00 via OPHTHALMIC

## 2013-05-08 MED ORDER — MIDAZOLAM HCL 2 MG/2ML IJ SOLN
1.0000 mg | INTRAMUSCULAR | Status: DC | PRN
  Administered 2013-05-08 (×2): 2 mg via INTRAVENOUS
  Filled 2013-05-08: qty 2

## 2013-05-08 MED ORDER — NEOMYCIN-POLYMYXIN-DEXAMETH 3.5-10000-0.1 OP SUSP
OPHTHALMIC | Status: DC | PRN
  Administered 2013-05-08: 2 [drp] via OPHTHALMIC

## 2013-05-08 MED ORDER — FENTANYL CITRATE 0.05 MG/ML IJ SOLN
INTRAMUSCULAR | Status: AC
  Filled 2013-05-08: qty 2

## 2013-05-08 MED ORDER — PHENYLEPHRINE HCL 2.5 % OP SOLN
1.0000 [drp] | OPHTHALMIC | Status: AC
  Administered 2013-05-08 (×3): 1 [drp] via OPHTHALMIC

## 2013-05-08 MED ORDER — EPINEPHRINE HCL 1 MG/ML IJ SOLN
INTRAOCULAR | Status: DC | PRN
  Administered 2013-05-08: 10:00:00

## 2013-05-08 MED ORDER — POVIDONE-IODINE 5 % OP SOLN
OPHTHALMIC | Status: DC | PRN
  Administered 2013-05-08: 1 via OPHTHALMIC

## 2013-05-08 MED ORDER — CYCLOPENTOLATE-PHENYLEPHRINE 0.2-1 % OP SOLN
1.0000 [drp] | OPHTHALMIC | Status: AC
  Administered 2013-05-08 (×3): 1 [drp] via OPHTHALMIC

## 2013-05-08 MED ORDER — ONDANSETRON HCL 4 MG/2ML IJ SOLN
INTRAMUSCULAR | Status: AC
  Filled 2013-05-08: qty 2

## 2013-05-08 MED ORDER — LACTATED RINGERS IV SOLN
INTRAVENOUS | Status: DC
Start: 2013-05-08 — End: 2013-05-08
  Administered 2013-05-08: 09:00:00 via INTRAVENOUS

## 2013-05-08 MED ORDER — LIDOCAINE 3.5 % OP GEL OPTIME - NO CHARGE
OPHTHALMIC | Status: DC | PRN
  Administered 2013-05-08: 2 [drp] via OPHTHALMIC

## 2013-05-08 MED ORDER — LIDOCAINE HCL 3.5 % OP GEL
1.0000 "application " | Freq: Once | OPHTHALMIC | Status: AC
  Administered 2013-05-08: 1 via OPHTHALMIC

## 2013-05-08 MED ORDER — PROVISC 10 MG/ML IO SOLN
INTRAOCULAR | Status: DC | PRN
  Administered 2013-05-08: 0.85 mL via INTRAOCULAR

## 2013-05-08 MED ORDER — EPINEPHRINE HCL 1 MG/ML IJ SOLN
INTRAMUSCULAR | Status: AC
  Filled 2013-05-08: qty 2

## 2013-05-08 MED ORDER — FENTANYL CITRATE 0.05 MG/ML IJ SOLN
25.0000 ug | INTRAMUSCULAR | Status: AC
  Administered 2013-05-08 (×2): 25 ug via INTRAVENOUS

## 2013-05-08 MED ORDER — MIDAZOLAM HCL 2 MG/2ML IJ SOLN
INTRAMUSCULAR | Status: AC
  Filled 2013-05-08: qty 2

## 2013-05-08 MED ORDER — TETRACAINE HCL 0.5 % OP SOLN
1.0000 [drp] | OPHTHALMIC | Status: AC
  Administered 2013-05-08 (×3): 1 [drp] via OPHTHALMIC

## 2013-05-08 MED ORDER — BSS IO SOLN
INTRAOCULAR | Status: DC | PRN
  Administered 2013-05-08: 15 mL via INTRAOCULAR

## 2013-05-08 MED ORDER — ONDANSETRON HCL 4 MG/2ML IJ SOLN
4.0000 mg | Freq: Once | INTRAMUSCULAR | Status: AC
  Administered 2013-05-08: 4 mg via INTRAVENOUS

## 2013-05-08 MED ORDER — ONDANSETRON HCL 4 MG/2ML IJ SOLN: 4.0000 mg | Freq: Once | INTRAMUSCULAR | Status: DC | PRN

## 2013-05-08 SURGICAL SUPPLY — 36 items
CAPSULAR TENSION RING-AMO (OPHTHALMIC RELATED) IMPLANT
CLOTH BEACON ORANGE TIMEOUT ST (SAFETY) ×3 IMPLANT
EYE SHIELD UNIVERSAL CLEAR (GAUZE/BANDAGES/DRESSINGS) ×3 IMPLANT
GLOVE BIO SURGEON STRL SZ 6.5 (GLOVE) IMPLANT
GLOVE BIO SURGEONS STRL SZ 6.5 (GLOVE)
GLOVE BIOGEL PI IND STRL 6.5 (GLOVE) IMPLANT
GLOVE BIOGEL PI IND STRL 7.0 (GLOVE) ×1 IMPLANT
GLOVE BIOGEL PI IND STRL 7.5 (GLOVE) IMPLANT
GLOVE BIOGEL PI INDICATOR 6.5 (GLOVE)
GLOVE BIOGEL PI INDICATOR 7.0 (GLOVE) ×2
GLOVE BIOGEL PI INDICATOR 7.5 (GLOVE)
GLOVE ECLIPSE 6.5 STRL STRAW (GLOVE) IMPLANT
GLOVE ECLIPSE 7.0 STRL STRAW (GLOVE) IMPLANT
GLOVE ECLIPSE 7.5 STRL STRAW (GLOVE) IMPLANT
GLOVE EXAM NITRILE LRG STRL (GLOVE) IMPLANT
GLOVE EXAM NITRILE MD LF STRL (GLOVE) IMPLANT
GLOVE SKINSENSE NS SZ6.5 (GLOVE)
GLOVE SKINSENSE NS SZ7.0 (GLOVE)
GLOVE SKINSENSE NS SZ7.5 (GLOVE) ×2
GLOVE SKINSENSE STRL SZ6.5 (GLOVE) IMPLANT
GLOVE SKINSENSE STRL SZ7.0 (GLOVE) IMPLANT
GLOVE SKINSENSE STRL SZ7.5 (GLOVE) ×1 IMPLANT
GLOVE SS N UNI LF 7.0 STRL (GLOVE) ×3 IMPLANT
KIT VITRECTOMY (OPHTHALMIC RELATED) IMPLANT
PAD ARMBOARD 7.5X6 YLW CONV (MISCELLANEOUS) ×3 IMPLANT
PROC W NO LENS (INTRAOCULAR LENS)
PROC W SPEC LENS (INTRAOCULAR LENS)
PROCESS W NO LENS (INTRAOCULAR LENS) IMPLANT
PROCESS W SPEC LENS (INTRAOCULAR LENS) IMPLANT
RING MALYGIN (MISCELLANEOUS) IMPLANT
SIGHTPATH CAT PROC W REG LENS (Ophthalmic Related) ×3 IMPLANT
SYR TB 1ML LL NO SAFETY (SYRINGE) ×3 IMPLANT
TAPE SURG TRANSPORE 1 IN (GAUZE/BANDAGES/DRESSINGS) ×1 IMPLANT
TAPE SURGICAL TRANSPORE 1 IN (GAUZE/BANDAGES/DRESSINGS) ×2
VISCOELASTIC ADDITIONAL (OPHTHALMIC RELATED) IMPLANT
WATER STERILE IRR 250ML POUR (IV SOLUTION) ×3 IMPLANT

## 2013-05-08 NOTE — Anesthesia Postprocedure Evaluation (Signed)
  Anesthesia Post-op Note  Patient: Stephanie [name]  Procedure(s) Performed: Procedure(s) with comments: CATARACT EXTRACTION PHACO AND INTRAOCULAR LENS PLACEMENT (IOC) (Right) - CDE 10.31  Patient Location: Short Stay  Anesthesia Type:MAC  Level of Consciousness: awake, alert  and oriented  Airway and Oxygen Therapy: Patient Spontanous Breathing  Post-op Pain: none  Post-op Assessment: Post-op Vital signs reviewed, Patient's Cardiovascular Status Stable, Respiratory Function Stable, Patent Airway and No signs of Nausea or vomiting  Post-op Vital Signs: Reviewed and stable  Complications: No apparent anesthesia complications

## 2013-05-08 NOTE — Op Note (Signed)
Date of Admission: 05/08/2013  Date of Surgery: 05/08/2013   Pre-Op Dx: Cataract Right Eye  Post-Op Dx: Combined Cataract Right  Eye,  Dx Code 366.19  Surgeon: Tonny Branch, M.D.  Assistants: None  Anesthesia: Topical with MAC  Indications: Painless, progressive loss of vision with compromise of daily activities.  Surgery: Cataract Extraction with Intraocular lens Implant Right Eye  Discription: The patient had dilating drops and viscous lidocaine placed into the Right eye in the pre-op holding area. After transfer to the operating room, a time out was performed. The patient was then prepped and draped. Beginning with a 45 degree blade a paracentesis port was made at the surgeon's 2 o'clock position. The anterior chamber was then filled with 1% non-preserved lidocaine. This was followed by filling the anterior chamber with Provisc.  A 2.56mm keratome blade was used to make a clear corneal incision at the temporal limbus.  A bent cystatome needle was used to create a continuous tear capsulotomy. Hydrodissection was performed with balanced salt solution on a Fine canula. The lens nucleus was then removed using the phacoemulsification handpiece. Residual cortex was removed with the I&A handpiece. The anterior chamber and capsular bag were refilled with Provisc. A posterior chamber intraocular lens was placed into the capsular bag with it's injector. The implant was positioned with the Kuglan hook. The Provisc was then removed from the anterior chamber and capsular bag with the I&A handpiece. Stromal hydration of the main incision and paracentesis port was performed with BSS on a Fine canula. The wounds were tested for leak which was negative. The patient tolerated the procedure well. There were no operative complications. The patient was then transferred to the recovery room in stable condition.  Complications: None  Specimen: None  EBL: None  Prosthetic device: Hoya iSert 250, power 19.5 D, SN  [id number].

## 2013-05-08 NOTE — H&P (Signed)
I have reviewed the H&P, the patient was re-examined, and I have identified no interval changes in medical condition and plan of care since the history and physical of record  

## 2013-05-08 NOTE — Transfer of Care (Signed)
Immediate Anesthesia Transfer of Care Note  Patient: Stephanie [name]  Procedure(s) Performed: Procedure(s) with comments: CATARACT EXTRACTION PHACO AND INTRAOCULAR LENS PLACEMENT (IOC) (Right) - CDE 10.31  Patient Location: Short Stay  Anesthesia Type:MAC  Level of Consciousness: awake  Airway & Oxygen Therapy: Patient Spontanous Breathing  Post-op Assessment: Report given to PACU RN  Post vital signs: Reviewed  Complications: No apparent anesthesia complications

## 2013-05-08 NOTE — Discharge Instructions (Signed)

## 2013-05-08 NOTE — Anesthesia Preprocedure Evaluation (Signed)
Anesthesia Evaluation  Patient identified by MRN, date of birth, ID band Patient awake    Reviewed: Allergy & Precautions, H&P , NPO status , Patient's Chart, lab work & pertinent test results, reviewed documented beta blocker date and time   History of Anesthesia Complications (+) PONV and history of anesthetic complications  Airway Mallampati: II TM Distance: >3 FB     Dental  (+) Teeth Intact   Pulmonary asthma , COPDCurrent Smoker,  breath sounds clear to auscultation        Cardiovascular hypertension, Pt. on medications + dysrhythmias (hx tachycardia) Rhythm:Regular Rate:Normal     Neuro/Psych    GI/Hepatic hiatal hernia, GERD-  Controlled,  Endo/Other  diabetes, Type 2, Oral Hypoglycemic Agents, Insulin DependentHyperthyroidism   Renal/GU      Musculoskeletal  (+) Fibromyalgia -  Abdominal   Peds  Hematology  (+) anemia ,   Anesthesia Other Findings   Reproductive/Obstetrics                           Anesthesia Physical Anesthesia Plan  ASA: III  Anesthesia Plan: MAC   Post-op Pain Management:    Induction: Intravenous  Airway Management Planned: Nasal Cannula  Additional Equipment:   Intra-op Plan:   Post-operative Plan:   Informed Consent: I have reviewed the patients History and Physical, chart, labs and discussed the procedure including the risks, benefits and alternatives for the proposed anesthesia with the patient or authorized representative who has indicated his/her understanding and acceptance.     Plan Discussed with:   Anesthesia Plan Comments:         Anesthesia Quick Evaluation  

## 2013-05-09 ENCOUNTER — Encounter (HOSPITAL_COMMUNITY): Payer: Self-pay | Admitting: Ophthalmology

## 2013-08-03 ENCOUNTER — Encounter (HOSPITAL_COMMUNITY): Payer: Self-pay | Admitting: Pharmacy Technician

## 2013-08-11 ENCOUNTER — Encounter (HOSPITAL_COMMUNITY)
Admission: RE | Admit: 2013-08-11 | Discharge: 2013-08-11 | Disposition: A | Discharge status: Home / Self Care | Payer: BC Managed Care – PPO | Source: Ambulatory Visit | Attending: Ophthalmology | Admitting: Ophthalmology

## 2013-08-11 ENCOUNTER — Encounter (HOSPITAL_COMMUNITY): Payer: Self-pay

## 2013-08-11 DIAGNOSIS — Z01812 Encounter for preprocedural laboratory examination: Secondary | ICD-10-CM | POA: Insufficient documentation

## 2013-08-11 DIAGNOSIS — Z01818 Encounter for other preprocedural examination: Secondary | ICD-10-CM | POA: Insufficient documentation

## 2013-08-11 LAB — BASIC METABOLIC PANEL
BUN: 14 mg/dL (ref 6–23)
CHLORIDE: 100 meq/L (ref 96–112)
CO2: 22 meq/L (ref 19–32)
Calcium: 9.3 mg/dL (ref 8.4–10.5)
Creatinine, Ser: 0.87 mg/dL (ref 0.50–1.10)
GFR calc Af Amer: 80 mL/min — ABNORMAL LOW (ref >90)
GFR calc non Af Amer: 69 mL/min — ABNORMAL LOW (ref >90)
Glucose, Bld: 357 mg/dL — ABNORMAL HIGH (ref 70–99)
POTASSIUM: 4.4 meq/L (ref 3.7–5.3)
Sodium: 137 mEq/L (ref 137–147)

## 2013-08-11 LAB — SURGICAL PCR SCREEN
MRSA, PCR: NEGATIVE
STAPHYLOCOCCUS AUREUS: POSITIVE — AB

## 2013-08-11 LAB — HEMOGLOBIN AND HEMATOCRIT, BLOOD
HCT: 36.1 % (ref 36.0–46.0)
Hemoglobin: 11.8 g/dL — ABNORMAL LOW (ref 12.0–15.0)

## 2013-08-11 NOTE — Pre-Procedure Instructions (Signed)
Patient given information to sign up for my chart at home. 

## 2013-08-11 NOTE — Patient Instructions (Signed)
[name]  08/11/2013   Your procedure is scheduled on:  08/17/13  Report to [name] at 1220 PM.  Call this number if you have problems the morning of surgery: [phone]   Remember:   Do not eat food or drink liquids after midnight.   Take these medicines the morning of surgery with A SIP OF WATER: flexeril, lisinopril, metoprolol, protonix, lyrica, & use your albuterol inhaler & bring it with you.   Do not wear jewelry, make-up or nail polish.  Do not wear lotions, powders, or perfumes. You may wear deodorant.  Do not shave 48 hours prior to surgery. Men may shave face and neck.  Do not bring valuables to the hospital.  [name] is not responsible                  for any belongings or valuables.               Contacts, dentures or bridgework may not be worn into surgery.  Leave suitcase in the car. After surgery it may be brought to your room.  For patients admitted to the hospital, discharge time is determined by your                treatment team.               Patients discharged the day of surgery will not be allowed to drive  home.  Name and phone number of your driver: family  Special Instructions: N/A   Please read over the following fact sheets that you were given: Anesthesia Post-op Instructions and Care and Recovery After Surgery   PATIENT INSTRUCTIONS POST-ANESTHESIA  IMMEDIATELY FOLLOWING SURGERY:  Do not drive or operate machinery for the first twenty four hours after surgery.  Do not make any important decisions for twenty four hours after surgery or while taking narcotic pain medications or sedatives.  If you develop intractable nausea and vomiting or a severe headache please notify your doctor immediately.  FOLLOW-UP:  Please make an appointment with your surgeon as instructed. You do not need to follow up with anesthesia unless specifically instructed to do so.  WOUND CARE INSTRUCTIONS (if applicable):  Keep a dry clean dressing on the anesthesia/puncture  wound site if there is drainage.  Once the wound has quit draining you may leave it open to air.  Generally you should leave the bandage intact for twenty four hours unless there is drainage.  If the epidural site drains for more than 36-48 hours please call the anesthesia department.  QUESTIONS?:  Please feel free to call your physician or the hospital operator if you have any questions, and they will be happy to assist you.      Cataract Surgery  A cataract is a clouding of the lens of the eye. When a lens becomes cloudy, vision is reduced based on the degree and nature of the clouding. Surgery may be needed to improve vision. Surgery removes the cloudy lens and usually replaces it with a substitute lens (intraocular lens, IOL). LET YOUR EYE DOCTOR KNOW ABOUT:  Allergies to food or medicine.  Medicines taken including herbs, eyedrops, over-the-counter medicines, and creams.  Use of steroids (by mouth or creams).  Previous problems with anesthetics or numbing medicine.  History of bleeding problems or blood clots.  Previous surgery.  Other health problems, including diabetes and kidney problems.  Possibility of pregnancy, if this applies. RISKS AND COMPLICATIONS  Infection.  Inflammation of the  eyeball (endophthalmitis) that can spread to both eyes (sympathetic ophthalmia).  Poor wound healing.  If an IOL is inserted, it can later fall out of proper position. This is very uncommon.  Clouding of the part of your eye that holds an IOL in place. This is called an "after-cataract." These are uncommon, but easily treated. BEFORE THE PROCEDURE  Do not eat or drink anything except small amounts of water for 8 to 12 before your surgery, or as directed by your caregiver.  Unless you are told otherwise, continue any eyedrops you have been prescribed.  Talk to your primary caregiver about all other medicines that you take (both prescription and non-prescription). In some cases, you  may need to stop or change medicines near the time of your surgery. This is most important if you are taking blood-thinning medicine.Do not stop medicines unless you are told to do so.  Arrange for someone to drive you to and from the procedure.  Do not put contact lenses in either eye on the day of your surgery. PROCEDURE There is more than one method for safely removing a cataract. Your doctor can explain the differences and help determine which is best for you. Phacoemulsification surgery is the most common form of cataract surgery.  An injection is given behind the eye or eyedrops are given to make this a painless procedure.  A small cut (incision) is made on the edge of the clear, dome-shaped surface that covers the front of the eye (cornea).  A tiny probe is painlessly inserted into the eye. This device gives off ultrasound waves that soften and break up the cloudy center of the lens. This makes it easier for the cloudy lens to be removed by suction.  An IOL may be implanted.  The normal lens of the eye is covered by a clear capsule. Part of that capsule is intentionally left in the eye to support the IOL.  Your surgeon may or may not use stitches to close the incision. There are other forms of cataract surgery that require a larger incision and stiches to close the eye. This approach is taken in cases where the doctor feels that the cataract cannot be easily removed using phacoemulsification. AFTER THE PROCEDURE  When an IOL is implanted, it does not need care. It becomes a permanent part of your eye and cannot be seen or felt.  Your doctor will schedule follow-up exams to check on your progress.  Review your other medicines with your doctor to see which can be resumed after surgery.  Use eyedrops or take medicine as prescribed by your doctor. Document Released: 01/22/2011 Document Revised: 04/27/2011 Document Reviewed: 01/22/2011 [name] Patient Information 2015 [place],  [place]. This information is not intended to replace advice given to you by your health care [name]. Make sure you discuss any questions you have with your health care Arryana Tolleson.

## 2013-08-17 ENCOUNTER — Ambulatory Visit (HOSPITAL_COMMUNITY): Payer: BC Managed Care – PPO | Admitting: Anesthesiology

## 2013-08-17 ENCOUNTER — Encounter (HOSPITAL_COMMUNITY)
Admission: RE | Disposition: A | Discharge status: Home / Self Care | Payer: Self-pay | Source: Ambulatory Visit | Attending: Ophthalmology

## 2013-08-17 ENCOUNTER — Ambulatory Visit (HOSPITAL_COMMUNITY)
Admission: RE | Admit: 2013-08-17 | Discharge: 2013-08-17 | Disposition: A | Discharge status: Home / Self Care | Payer: BC Managed Care – PPO | Source: Ambulatory Visit | Attending: Ophthalmology | Admitting: Ophthalmology

## 2013-08-17 ENCOUNTER — Encounter (HOSPITAL_COMMUNITY): Payer: Self-pay | Admitting: *Deleted

## 2013-08-17 ENCOUNTER — Encounter (HOSPITAL_COMMUNITY): Payer: BC Managed Care – PPO | Admitting: Anesthesiology

## 2013-08-17 DIAGNOSIS — J4489 Other specified chronic obstructive pulmonary disease: Secondary | ICD-10-CM | POA: Insufficient documentation

## 2013-08-17 DIAGNOSIS — Z794 Long term (current) use of insulin: Secondary | ICD-10-CM | POA: Insufficient documentation

## 2013-08-17 DIAGNOSIS — K219 Gastro-esophageal reflux disease without esophagitis: Secondary | ICD-10-CM | POA: Insufficient documentation

## 2013-08-17 DIAGNOSIS — Z7982 Long term (current) use of aspirin: Secondary | ICD-10-CM | POA: Insufficient documentation

## 2013-08-17 DIAGNOSIS — I10 Essential (primary) hypertension: Secondary | ICD-10-CM | POA: Insufficient documentation

## 2013-08-17 DIAGNOSIS — D649 Anemia, unspecified: Secondary | ICD-10-CM | POA: Insufficient documentation

## 2013-08-17 DIAGNOSIS — H269 Unspecified cataract: Secondary | ICD-10-CM | POA: Insufficient documentation

## 2013-08-17 DIAGNOSIS — Z87891 Personal history of nicotine dependence: Secondary | ICD-10-CM | POA: Insufficient documentation

## 2013-08-17 DIAGNOSIS — J449 Chronic obstructive pulmonary disease, unspecified: Secondary | ICD-10-CM | POA: Insufficient documentation

## 2013-08-17 DIAGNOSIS — Z79899 Other long term (current) drug therapy: Secondary | ICD-10-CM | POA: Insufficient documentation

## 2013-08-17 DIAGNOSIS — E119 Type 2 diabetes mellitus without complications: Secondary | ICD-10-CM | POA: Insufficient documentation

## 2013-08-17 HISTORY — PX: CATARACT EXTRACTION W/PHACO: SHX586

## 2013-08-17 LAB — GLUCOSE, CAPILLARY: GLUCOSE-CAPILLARY: 215 mg/dL — AB (ref 70–99)

## 2013-08-17 SURGERY — PHACOEMULSIFICATION, CATARACT, WITH IOL INSERTION
Anesthesia: Monitor Anesthesia Care | Site: Eye | Laterality: Left

## 2013-08-17 MED ORDER — LIDOCAINE HCL 3.5 % OP GEL
OPHTHALMIC | Status: AC
  Filled 2013-08-17: qty 1

## 2013-08-17 MED ORDER — MIDAZOLAM HCL 2 MG/2ML IJ SOLN
1.0000 mg | INTRAMUSCULAR | Status: DC | PRN
  Administered 2013-08-17: 2 mg via INTRAVENOUS

## 2013-08-17 MED ORDER — NEOMYCIN-POLYMYXIN-DEXAMETH 3.5-10000-0.1 OP SUSP
OPHTHALMIC | Status: DC | PRN
  Administered 2013-08-17: 2 [drp] via OPHTHALMIC

## 2013-08-17 MED ORDER — MIDAZOLAM HCL 2 MG/2ML IJ SOLN
INTRAMUSCULAR | Status: AC
  Filled 2013-08-17: qty 2

## 2013-08-17 MED ORDER — CYCLOPENTOLATE-PHENYLEPHRINE 0.2-1 % OP SOLN
1.0000 [drp] | OPHTHALMIC | Status: AC
  Administered 2013-08-17 (×3): 1 [drp] via OPHTHALMIC

## 2013-08-17 MED ORDER — POVIDONE-IODINE 5 % OP SOLN
OPHTHALMIC | Status: DC | PRN
  Administered 2013-08-17: 1 via OPHTHALMIC

## 2013-08-17 MED ORDER — TETRACAINE HCL 0.5 % OP SOLN
OPHTHALMIC | Status: AC
  Filled 2013-08-17: qty 2

## 2013-08-17 MED ORDER — EPINEPHRINE HCL 1 MG/ML IJ SOLN
INTRAMUSCULAR | Status: AC
  Filled 2013-08-17: qty 1

## 2013-08-17 MED ORDER — PROVISC 10 MG/ML IO SOLN
INTRAOCULAR | Status: DC | PRN
  Administered 2013-08-17: 0.85 mL via INTRAOCULAR

## 2013-08-17 MED ORDER — NEOMYCIN-POLYMYXIN-DEXAMETH 3.5-10000-0.1 OP SUSP
OPHTHALMIC | Status: AC
  Filled 2013-08-17: qty 5

## 2013-08-17 MED ORDER — MUPIROCIN 2 % EX OINT
TOPICAL_OINTMENT | Freq: Two times a day (BID) | CUTANEOUS | Status: DC
  Administered 2013-08-17: 13:00:00 via NASAL

## 2013-08-17 MED ORDER — BSS IO SOLN
INTRAOCULAR | Status: DC | PRN
  Administered 2013-08-17: 14:00:00

## 2013-08-17 MED ORDER — MUPIROCIN 2 % EX OINT
TOPICAL_OINTMENT | CUTANEOUS | Status: AC
  Filled 2013-08-17: qty 22

## 2013-08-17 MED ORDER — TETRACAINE HCL 0.5 % OP SOLN
1.0000 [drp] | OPHTHALMIC | Status: AC
  Administered 2013-08-17 (×3): 1 [drp] via OPHTHALMIC

## 2013-08-17 MED ORDER — FENTANYL CITRATE 0.05 MG/ML IJ SOLN
INTRAMUSCULAR | Status: AC
  Administered 2013-08-17: 25 ug
  Filled 2013-08-17: qty 2

## 2013-08-17 MED ORDER — LACTATED RINGERS IV SOLN
INTRAVENOUS | Status: DC
  Administered 2013-08-17: 13:00:00 via INTRAVENOUS

## 2013-08-17 MED ORDER — LIDOCAINE HCL (PF) 1 % IJ SOLN
INTRAMUSCULAR | Status: DC | PRN
  Administered 2013-08-17: .7 mL

## 2013-08-17 MED ORDER — LIDOCAINE HCL 3.5 % OP GEL
1.0000 "application " | Freq: Once | OPHTHALMIC | Status: AC
  Administered 2013-08-17: 1 via OPHTHALMIC

## 2013-08-17 MED ORDER — CYCLOPENTOLATE-PHENYLEPHRINE OP SOLN OPTIME - NO CHARGE
OPHTHALMIC | Status: AC
  Filled 2013-08-17: qty 2

## 2013-08-17 MED ORDER — LIDOCAINE HCL (PF) 1 % IJ SOLN
INTRAMUSCULAR | Status: AC
  Filled 2013-08-17: qty 2

## 2013-08-17 MED ORDER — BSS IO SOLN
INTRAOCULAR | Status: DC | PRN
  Administered 2013-08-17: 15 mL

## 2013-08-17 MED ORDER — PHENYLEPHRINE HCL 2.5 % OP SOLN
1.0000 [drp] | OPHTHALMIC | Status: AC
  Administered 2013-08-17 (×3): 1 [drp] via OPHTHALMIC

## 2013-08-17 MED ORDER — FENTANYL CITRATE 0.05 MG/ML IJ SOLN
25.0000 ug | INTRAMUSCULAR | Status: AC
  Administered 2013-08-17 (×2): 25 ug via INTRAVENOUS

## 2013-08-17 MED ORDER — PHENYLEPHRINE HCL 2.5 % OP SOLN
OPHTHALMIC | Status: AC
  Filled 2013-08-17: qty 15

## 2013-08-17 SURGICAL SUPPLY — 35 items
CAPSULAR TENSION RING-AMO (OPHTHALMIC RELATED) IMPLANT
CLOTH BEACON ORANGE TIMEOUT ST (SAFETY) ×3 IMPLANT
EYE SHIELD UNIVERSAL CLEAR (GAUZE/BANDAGES/DRESSINGS) ×3 IMPLANT
GLOVE BIO SURGEON STRL SZ 6.5 (GLOVE) IMPLANT
GLOVE BIO SURGEONS STRL SZ 6.5 (GLOVE)
GLOVE BIOGEL PI IND STRL 6.5 (GLOVE) ×1 IMPLANT
GLOVE BIOGEL PI IND STRL 7.0 (GLOVE) ×1 IMPLANT
GLOVE BIOGEL PI IND STRL 7.5 (GLOVE) IMPLANT
GLOVE BIOGEL PI IND STRL 8.5 (GLOVE) ×1 IMPLANT
GLOVE BIOGEL PI INDICATOR 6.5 (GLOVE) ×2
GLOVE BIOGEL PI INDICATOR 7.0 (GLOVE) ×2
GLOVE BIOGEL PI INDICATOR 7.5 (GLOVE)
GLOVE BIOGEL PI INDICATOR 8.5 (GLOVE) ×2
GLOVE ECLIPSE 6.5 STRL STRAW (GLOVE) IMPLANT
GLOVE ECLIPSE 7.0 STRL STRAW (GLOVE) IMPLANT
GLOVE ECLIPSE 7.5 STRL STRAW (GLOVE) IMPLANT
GLOVE EXAM NITRILE LRG STRL (GLOVE) IMPLANT
GLOVE EXAM NITRILE MD LF STRL (GLOVE) IMPLANT
GLOVE SKINSENSE NS SZ6.5 (GLOVE)
GLOVE SKINSENSE NS SZ7.0 (GLOVE)
GLOVE SKINSENSE STRL SZ6.5 (GLOVE) IMPLANT
GLOVE SKINSENSE STRL SZ7.0 (GLOVE) IMPLANT
KIT VITRECTOMY (OPHTHALMIC RELATED) IMPLANT
PAD ARMBOARD 7.5X6 YLW CONV (MISCELLANEOUS) ×3 IMPLANT
PROC W NO LENS (INTRAOCULAR LENS)
PROC W SPEC LENS (INTRAOCULAR LENS)
PROCESS W NO LENS (INTRAOCULAR LENS) IMPLANT
PROCESS W SPEC LENS (INTRAOCULAR LENS) IMPLANT
RING MALYGIN (MISCELLANEOUS) IMPLANT
SIGHTPATH CAT PROC W REG LENS (Ophthalmic Related) ×3 IMPLANT
SYR TB 1ML LL NO SAFETY (SYRINGE) ×3 IMPLANT
TAPE SURG TRANSPORE 1 IN (GAUZE/BANDAGES/DRESSINGS) ×1 IMPLANT
TAPE SURGICAL TRANSPORE 1 IN (GAUZE/BANDAGES/DRESSINGS) ×2
VISCOELASTIC ADDITIONAL (OPHTHALMIC RELATED) IMPLANT
WATER STERILE IRR 250ML POUR (IV SOLUTION) ×3 IMPLANT

## 2013-08-17 NOTE — Anesthesia Postprocedure Evaluation (Signed)
  Anesthesia Post-op Note  Patient: [name]  Procedure(s) Performed: Procedure(s) with comments: CATARACT EXTRACTION PHACO AND INTRAOCULAR LENS PLACEMENT (IOC) (Left) - CDE:9.03  Patient Location: Short Stay  Anesthesia Type:MAC  Level of Consciousness: awake, alert  and oriented  Airway and Oxygen Therapy: Patient Spontanous Breathing  Post-op Pain: none  Post-op Assessment: Post-op Vital signs reviewed, Patient's Cardiovascular Status Stable, Respiratory Function Stable, Patent Airway and No signs of Nausea or vomiting  Post-op Vital Signs: Reviewed and stable  Last Vitals:  Filed Vitals:   08/17/13 1315  BP: 128/79  Pulse:   Temp:   Resp: 24    Complications: No apparent anesthesia complications

## 2013-08-17 NOTE — Discharge Instructions (Signed)

## 2013-08-17 NOTE — Anesthesia Preprocedure Evaluation (Signed)
Anesthesia Evaluation  Patient identified by MRN, date of birth, ID band Patient awake    Reviewed: Allergy & Precautions, H&P , NPO status , Patient's Chart, lab work & pertinent test results, reviewed documented beta blocker date and time   History of Anesthesia Complications (+) PONV and history of anesthetic complications  Airway Mallampati: II TM Distance: >3 FB     Dental  (+) Teeth Intact   Pulmonary asthma , COPDCurrent Smoker,  breath sounds clear to auscultation        Cardiovascular hypertension, Pt. on medications + dysrhythmias (hx tachycardia) Rhythm:Regular Rate:Normal     Neuro/Psych    GI/Hepatic hiatal hernia, GERD-  Controlled,  Endo/Other  diabetes, Type 2, Oral Hypoglycemic Agents, Insulin DependentHyperthyroidism   Renal/GU      Musculoskeletal  (+) Fibromyalgia -  Abdominal   Peds  Hematology  (+) anemia ,   Anesthesia Other Findings   Reproductive/Obstetrics                           Anesthesia Physical Anesthesia Plan  ASA: III  Anesthesia Plan: MAC   Post-op Pain Management:    Induction: Intravenous  Airway Management Planned: Nasal Cannula  Additional Equipment:   Intra-op Plan:   Post-operative Plan:   Informed Consent: I have reviewed the patients History and Physical, chart, labs and discussed the procedure including the risks, benefits and alternatives for the proposed anesthesia with the patient or authorized representative who has indicated his/her understanding and acceptance.     Plan Discussed with:   Anesthesia Plan Comments:         Anesthesia Quick Evaluation

## 2013-08-17 NOTE — Op Note (Signed)
Date of Admission: 08/17/2013  Date of Surgery: 08/17/2013   Pre-Op Dx: Cataract Left Eye  Post-Op Dx: Cataract Left  Eye,  Dx Code 366.19  Surgeon: Tonny Branch, M.D.  Assistants: None  Anesthesia: Topical with MAC  Indications: Painless, progressive loss of vision with compromise of daily activities.  Surgery: Cataract Extraction with Intraocular lens Implant Left Eye  Discription: The patient had dilating drops and viscous lidocaine placed into the Left eye in the pre-op holding area. After transfer to the operating room, a time out was performed. The patient was then prepped and draped. Beginning with a 43 degree blade a paracentesis port was made at the surgeon's 2 o'clock position. The anterior chamber was then filled with 1% non-preserved lidocaine. This was followed by filling the anterior chamber with Provisc.  A 2.43mm keratome blade was used to make a clear corneal incision at the temporal limbus.  A bent cystatome needle was used to create a continuous tear capsulotomy. Hydrodissection was performed with balanced salt solution on a Fine canula. The lens nucleus was then removed using the phacoemulsification handpiece. Residual cortex was removed with the I&A handpiece. The anterior chamber and capsular bag were refilled with Provisc. A posterior chamber intraocular lens was placed into the capsular bag with it's injector. The implant was positioned with the Kuglan hook. The Provisc was then removed from the anterior chamber and capsular bag with the I&A handpiece. Stromal hydration of the main incision and paracentesis port was performed with BSS on a Fine canula. The wounds were tested for leak which was negative. The patient tolerated the procedure well. There were no operative complications. The patient was then transferred to the recovery room in stable condition.  Complications: None  Specimen: None  EBL: None  Prosthetic device: Hoya iSert 250, power 19.5 D, SN [id number].

## 2013-08-17 NOTE — H&P (Signed)
I have reviewed the H&P, the patient was re-examined, and I have identified no interval changes in medical condition and plan of care since the history and physical of record  

## 2013-08-17 NOTE — Transfer of Care (Signed)
Immediate Anesthesia Transfer of Care Note  Patient: [name]  Procedure(s) Performed: Procedure(s) with comments: CATARACT EXTRACTION PHACO AND INTRAOCULAR LENS PLACEMENT (IOC) (Left) - CDE:9.03  Patient Location: Short Stay  Anesthesia Type:MAC  Level of Consciousness: awake  Airway & Oxygen Therapy: Patient Spontanous Breathing  Post-op Assessment: Report given to PACU RN  Post vital signs: Reviewed  Complications: No apparent anesthesia complications

## 2013-08-21 ENCOUNTER — Encounter (HOSPITAL_COMMUNITY): Payer: Self-pay | Admitting: Ophthalmology

## 2013-10-19 ENCOUNTER — Encounter (HOSPITAL_COMMUNITY): Payer: Self-pay | Admitting: Emergency Medicine

## 2013-10-19 ENCOUNTER — Inpatient Hospital Stay (HOSPITAL_COMMUNITY)
Admission: EM | Admit: 2013-10-19 | Discharge: 2013-10-22 | DRG: 392 | Disposition: A | Discharge status: Home / Self Care | Payer: BC Managed Care – PPO | Attending: Family Medicine | Admitting: Family Medicine

## 2013-10-19 ENCOUNTER — Emergency Department (HOSPITAL_COMMUNITY): Payer: BC Managed Care – PPO

## 2013-10-19 DIAGNOSIS — Z720 Tobacco use: Secondary | ICD-10-CM | POA: Diagnosis present

## 2013-10-19 DIAGNOSIS — Z23 Encounter for immunization: Secondary | ICD-10-CM | POA: Diagnosis not present

## 2013-10-19 DIAGNOSIS — E1169 Type 2 diabetes mellitus with other specified complication: Secondary | ICD-10-CM | POA: Diagnosis present

## 2013-10-19 DIAGNOSIS — Z87891 Personal history of nicotine dependence: Secondary | ICD-10-CM | POA: Diagnosis present

## 2013-10-19 DIAGNOSIS — R000 Tachycardia, unspecified: Secondary | ICD-10-CM | POA: Diagnosis present

## 2013-10-19 DIAGNOSIS — E0590 Thyrotoxicosis, unspecified without thyrotoxic crisis or storm: Secondary | ICD-10-CM | POA: Diagnosis present

## 2013-10-19 DIAGNOSIS — J449 Chronic obstructive pulmonary disease, unspecified: Secondary | ICD-10-CM | POA: Diagnosis present

## 2013-10-19 DIAGNOSIS — F17200 Nicotine dependence, unspecified, uncomplicated: Secondary | ICD-10-CM | POA: Diagnosis present

## 2013-10-19 DIAGNOSIS — E860 Dehydration: Secondary | ICD-10-CM | POA: Diagnosis present

## 2013-10-19 DIAGNOSIS — Z794 Long term (current) use of insulin: Secondary | ICD-10-CM

## 2013-10-19 DIAGNOSIS — E039 Hypothyroidism, unspecified: Secondary | ICD-10-CM | POA: Diagnosis present

## 2013-10-19 DIAGNOSIS — I498 Other specified cardiac arrhythmias: Secondary | ICD-10-CM | POA: Diagnosis present

## 2013-10-19 DIAGNOSIS — D649 Anemia, unspecified: Secondary | ICD-10-CM | POA: Insufficient documentation

## 2013-10-19 DIAGNOSIS — Z7982 Long term (current) use of aspirin: Secondary | ICD-10-CM | POA: Diagnosis not present

## 2013-10-19 DIAGNOSIS — Z79899 Other long term (current) drug therapy: Secondary | ICD-10-CM

## 2013-10-19 DIAGNOSIS — K5792 Diverticulitis of intestine, part unspecified, without perforation or abscess without bleeding: Secondary | ICD-10-CM | POA: Diagnosis present

## 2013-10-19 DIAGNOSIS — E669 Obesity, unspecified: Secondary | ICD-10-CM | POA: Diagnosis present

## 2013-10-19 DIAGNOSIS — K5732 Diverticulitis of large intestine without perforation or abscess without bleeding: Principal | ICD-10-CM | POA: Diagnosis present

## 2013-10-19 DIAGNOSIS — E1165 Type 2 diabetes mellitus with hyperglycemia: Secondary | ICD-10-CM

## 2013-10-19 DIAGNOSIS — I10 Essential (primary) hypertension: Secondary | ICD-10-CM | POA: Diagnosis present

## 2013-10-19 DIAGNOSIS — D72829 Elevated white blood cell count, unspecified: Secondary | ICD-10-CM | POA: Diagnosis present

## 2013-10-19 DIAGNOSIS — K219 Gastro-esophageal reflux disease without esophagitis: Secondary | ICD-10-CM | POA: Diagnosis present

## 2013-10-19 DIAGNOSIS — J4489 Other specified chronic obstructive pulmonary disease: Secondary | ICD-10-CM | POA: Diagnosis present

## 2013-10-19 DIAGNOSIS — Z6837 Body mass index (BMI) 37.0-37.9, adult: Secondary | ICD-10-CM | POA: Diagnosis not present

## 2013-10-19 DIAGNOSIS — R1032 Left lower quadrant pain: Secondary | ICD-10-CM | POA: Diagnosis not present

## 2013-10-19 DIAGNOSIS — IMO0001 Reserved for inherently not codable concepts without codable children: Secondary | ICD-10-CM | POA: Diagnosis present

## 2013-10-19 DIAGNOSIS — E119 Type 2 diabetes mellitus without complications: Secondary | ICD-10-CM | POA: Diagnosis present

## 2013-10-19 LAB — CBC
HCT: 31.5 % — ABNORMAL LOW (ref 36.0–46.0)
Hemoglobin: 10.3 g/dL — ABNORMAL LOW (ref 12.0–15.0)
MCH: 28.2 pg (ref 26.0–34.0)
MCHC: 32.7 g/dL (ref 30.0–36.0)
MCV: 86.3 fL (ref 78.0–100.0)
Platelets: 232 10*3/uL (ref 150–400)
RBC: 3.65 MIL/uL — ABNORMAL LOW (ref 3.87–5.11)
RDW: 15.4 % (ref 11.5–15.5)
WBC: 13 10*3/uL — AB (ref 4.0–10.5)

## 2013-10-19 LAB — CBC WITH DIFFERENTIAL/PLATELET
Basophils Absolute: 0 10*3/uL (ref 0.0–0.1)
Basophils Relative: 0 % (ref 0–1)
Eosinophils Absolute: 0.5 10*3/uL (ref 0.0–0.7)
Eosinophils Relative: 4 % (ref 0–5)
HCT: 34.9 % — ABNORMAL LOW (ref 36.0–46.0)
HEMOGLOBIN: 11.7 g/dL — AB (ref 12.0–15.0)
LYMPHS ABS: 3.9 10*3/uL (ref 0.7–4.0)
LYMPHS PCT: 25 % (ref 12–46)
MCH: 29 pg (ref 26.0–34.0)
MCHC: 33.5 g/dL (ref 30.0–36.0)
MCV: 86.4 fL (ref 78.0–100.0)
MONOS PCT: 6 % (ref 3–12)
Monocytes Absolute: 1 10*3/uL (ref 0.1–1.0)
NEUTROS ABS: 10.2 10*3/uL — AB (ref 1.7–7.7)
NEUTROS PCT: 65 % (ref 43–77)
Platelets: 261 10*3/uL (ref 150–400)
RBC: 4.04 MIL/uL (ref 3.87–5.11)
RDW: 15.5 % (ref 11.5–15.5)
WBC: 15.7 10*3/uL — ABNORMAL HIGH (ref 4.0–10.5)

## 2013-10-19 LAB — GLUCOSE, CAPILLARY
GLUCOSE-CAPILLARY: 183 mg/dL — AB (ref 70–99)
Glucose-Capillary: 178 mg/dL — ABNORMAL HIGH (ref 70–99)
Glucose-Capillary: 176 mg/dL — ABNORMAL HIGH (ref 70–99)

## 2013-10-19 LAB — URINALYSIS, ROUTINE W REFLEX MICROSCOPIC
BILIRUBIN URINE: NEGATIVE
HGB URINE DIPSTICK: NEGATIVE
Ketones, ur: NEGATIVE mg/dL
Nitrite: NEGATIVE
PROTEIN: NEGATIVE mg/dL
SPECIFIC GRAVITY, URINE: 1.01 (ref 1.005–1.030)
UROBILINOGEN UA: 0.2 mg/dL (ref 0.0–1.0)
pH: 6 (ref 5.0–8.0)

## 2013-10-19 LAB — COMPREHENSIVE METABOLIC PANEL
ALK PHOS: 177 U/L — AB (ref 39–117)
ALT: 30 U/L (ref 0–35)
AST: 20 U/L (ref 0–37)
Albumin: 3.4 g/dL — ABNORMAL LOW (ref 3.5–5.2)
Anion gap: 12 (ref 5–15)
BILIRUBIN TOTAL: 0.3 mg/dL (ref 0.3–1.2)
BUN: 12 mg/dL (ref 6–23)
CHLORIDE: 101 meq/L (ref 96–112)
CO2: 24 mEq/L (ref 19–32)
Calcium: 9.2 mg/dL (ref 8.4–10.5)
Creatinine, Ser: 0.88 mg/dL (ref 0.50–1.10)
GFR calc non Af Amer: 68 mL/min — ABNORMAL LOW (ref >90)
GFR, EST AFRICAN AMERICAN: 79 mL/min — AB (ref >90)
GLUCOSE: 259 mg/dL — AB (ref 70–99)
POTASSIUM: 4.3 meq/L (ref 3.7–5.3)
Sodium: 137 mEq/L (ref 137–147)
Total Protein: 8.5 g/dL — ABNORMAL HIGH (ref 6.0–8.3)

## 2013-10-19 LAB — CREATININE, SERUM
Creatinine, Ser: 0.81 mg/dL (ref 0.50–1.10)
GFR calc non Af Amer: 76 mL/min — ABNORMAL LOW (ref >90)
GFR, EST AFRICAN AMERICAN: 88 mL/min — AB (ref >90)

## 2013-10-19 LAB — LIPASE, BLOOD: Lipase: 23 U/L (ref 11–59)

## 2013-10-19 LAB — URINE MICROSCOPIC-ADD ON

## 2013-10-19 LAB — HEMOGLOBIN A1C
HEMOGLOBIN A1C: 10.8 % — AB (ref <5.7)
MEAN PLASMA GLUCOSE: 263 mg/dL — AB (ref <117)

## 2013-10-19 LAB — TSH: TSH: 2.29 u[IU]/mL (ref 0.350–4.500)

## 2013-10-19 MED ORDER — ONDANSETRON HCL 4 MG/2ML IJ SOLN
4.0000 mg | Freq: Four times a day (QID) | INTRAMUSCULAR | Status: DC | PRN
  Administered 2013-10-20 (×2): 4 mg via INTRAVENOUS
  Filled 2013-10-19 (×2): qty 2

## 2013-10-19 MED ORDER — INSULIN ASPART 100 UNIT/ML ~~LOC~~ SOLN
0.0000 [IU] | Freq: Every day | SUBCUTANEOUS | Status: DC
  Administered 2013-10-20: 2 [IU] via SUBCUTANEOUS
  Administered 2013-10-21: 3 [IU] via SUBCUTANEOUS

## 2013-10-19 MED ORDER — BISACODYL 10 MG RE SUPP: 10.0000 mg | Freq: Every day | RECTAL | Status: DC | PRN

## 2013-10-19 MED ORDER — SODIUM CHLORIDE 0.9 % IV BOLUS (SEPSIS)
1000.0000 mL | Freq: Once | INTRAVENOUS | Status: AC
  Administered 2013-10-19: 1000 mL via INTRAVENOUS

## 2013-10-19 MED ORDER — SODIUM CHLORIDE 0.9 % IV SOLN
INTRAVENOUS | Status: DC
  Administered 2013-10-19: 13:00:00 via INTRAVENOUS

## 2013-10-19 MED ORDER — IOHEXOL 300 MG/ML  SOLN
50.0000 mL | Freq: Once | INTRAMUSCULAR | Status: AC | PRN
  Administered 2013-10-19: 50 mL via ORAL

## 2013-10-19 MED ORDER — METRONIDAZOLE IN NACL 5-0.79 MG/ML-% IV SOLN
500.0000 mg | Freq: Once | INTRAVENOUS | Status: DC
Start: 2013-10-19 — End: 2013-10-19
  Filled 2013-10-19: qty 100

## 2013-10-19 MED ORDER — MORPHINE SULFATE 4 MG/ML IJ SOLN
4.0000 mg | Freq: Once | INTRAMUSCULAR | Status: AC
  Administered 2013-10-19: 4 mg via INTRAVENOUS
  Filled 2013-10-19: qty 1

## 2013-10-19 MED ORDER — ALUM & MAG HYDROXIDE-SIMETH 200-200-20 MG/5ML PO SUSP: 30.0000 mL | Freq: Four times a day (QID) | ORAL | Status: DC | PRN

## 2013-10-19 MED ORDER — CIPROFLOXACIN IN D5W 400 MG/200ML IV SOLN
400.0000 mg | Freq: Once | INTRAVENOUS | Status: AC
  Administered 2013-10-19: 400 mg via INTRAVENOUS
  Filled 2013-10-19: qty 200

## 2013-10-19 MED ORDER — ONDANSETRON HCL 4 MG/2ML IJ SOLN
4.0000 mg | Freq: Once | INTRAMUSCULAR | Status: AC
  Administered 2013-10-19: 4 mg via INTRAVENOUS
  Filled 2013-10-19: qty 2

## 2013-10-19 MED ORDER — ENOXAPARIN SODIUM 40 MG/0.4ML ~~LOC~~ SOLN
40.0000 mg | SUBCUTANEOUS | Status: DC
  Administered 2013-10-19 – 2013-10-21 (×3): 40 mg via SUBCUTANEOUS
  Filled 2013-10-19 (×3): qty 0.4

## 2013-10-19 MED ORDER — CIPROFLOXACIN IN D5W 400 MG/200ML IV SOLN
400.0000 mg | Freq: Two times a day (BID) | INTRAVENOUS | Status: DC
  Administered 2013-10-19 – 2013-10-21 (×6): 400 mg via INTRAVENOUS
  Filled 2013-10-19 (×6): qty 200

## 2013-10-19 MED ORDER — METRONIDAZOLE IN NACL 5-0.79 MG/ML-% IV SOLN
500.0000 mg | Freq: Three times a day (TID) | INTRAVENOUS | Status: DC
  Administered 2013-10-19 – 2013-10-22 (×9): 500 mg via INTRAVENOUS
  Filled 2013-10-19 (×9): qty 100

## 2013-10-19 MED ORDER — ALBUTEROL SULFATE (2.5 MG/3ML) 0.083% IN NEBU: 2.5000 mg | INHALATION_SOLUTION | Freq: Four times a day (QID) | RESPIRATORY_TRACT | Status: DC | PRN

## 2013-10-19 MED ORDER — MORPHINE SULFATE 2 MG/ML IJ SOLN
1.0000 mg | INTRAMUSCULAR | Status: DC | PRN
  Administered 2013-10-19 – 2013-10-22 (×13): 1 mg via INTRAVENOUS
  Filled 2013-10-19 (×13): qty 1

## 2013-10-19 MED ORDER — TRAZODONE HCL 50 MG PO TABS: 25.0000 mg | ORAL_TABLET | Freq: Every evening | ORAL | Status: DC | PRN

## 2013-10-19 MED ORDER — ACETAMINOPHEN 650 MG RE SUPP: 650.0000 mg | Freq: Four times a day (QID) | RECTAL | Status: DC | PRN

## 2013-10-19 MED ORDER — ONDANSETRON HCL 4 MG PO TABS: 4.0000 mg | ORAL_TABLET | Freq: Four times a day (QID) | ORAL | Status: DC | PRN

## 2013-10-19 MED ORDER — METOPROLOL TARTRATE 1 MG/ML IV SOLN
5.0000 mg | Freq: Three times a day (TID) | INTRAVENOUS | Status: DC
  Administered 2013-10-19: 5 mg via INTRAVENOUS
  Filled 2013-10-19: qty 5

## 2013-10-19 MED ORDER — INSULIN DETEMIR 100 UNIT/ML ~~LOC~~ SOLN
30.0000 [IU] | Freq: Two times a day (BID) | SUBCUTANEOUS | Status: DC
  Administered 2013-10-19 – 2013-10-20 (×3): 30 [IU] via SUBCUTANEOUS
  Filled 2013-10-19 (×9): qty 0.3

## 2013-10-19 MED ORDER — ACETAMINOPHEN 325 MG PO TABS
650.0000 mg | ORAL_TABLET | Freq: Four times a day (QID) | ORAL | Status: DC | PRN
  Administered 2013-10-19: 650 mg via ORAL
  Filled 2013-10-19: qty 2

## 2013-10-19 MED ORDER — IOHEXOL 300 MG/ML  SOLN
100.0000 mL | Freq: Once | INTRAMUSCULAR | Status: AC | PRN
  Administered 2013-10-19: 100 mL via INTRAVENOUS

## 2013-10-19 MED ORDER — INSULIN ASPART 100 UNIT/ML ~~LOC~~ SOLN
0.0000 [IU] | Freq: Three times a day (TID) | SUBCUTANEOUS | Status: DC
  Administered 2013-10-19 (×2): 3 [IU] via SUBCUTANEOUS
  Administered 2013-10-20: 2 [IU] via SUBCUTANEOUS
  Administered 2013-10-20 (×2): 5 [IU] via SUBCUTANEOUS
  Administered 2013-10-21 (×2): 8 [IU] via SUBCUTANEOUS
  Administered 2013-10-21 – 2013-10-22 (×2): 3 [IU] via SUBCUTANEOUS

## 2013-10-19 NOTE — H&P (Signed)
Patient seen, independently examined and chart reviewed. I agree with exam, assessment and plan discussed with Dyanne Carrel, NP.  63 year old woman presented emergency department with approximately one-week history of progressive left lower quadrant abdominal pain with associated nausea. No vomiting. She has been in fairly well and drinking fairly well. No fever at home. She had some kind of abdominal problem approximately 30 years ago she cannot recall the specifics of this. She reports a colonoscopy approximately 10 or more years ago which was unremarkable (by Dr. Velora Heckler?)  Exam, history and imaging suggested developing  diverticulitis.  PMH DM Hypothyroidism GERD COPD  Objective: Afebrile, vital signs are stable. No hypoxia.  Gen. Appears calm, comfortable.  Psych. Alert, speech fluent and clear.  Eyes. Pupils appear unremarkable.  ENT. Lips and tongue appear unremarkable.  Neck. No lymphadenopathy or masses. No thyromegaly.  Cardiovascular. Regular rate and rhythm. No murmur, rub or gallop.  Respiratory. Clear to auscultation bilaterally. No wheezes, rales or rhonchi. Normal respiratory effort.  Abdomen. Soft, non-distended, no rebound tenderness, mild left lower quadrant abdominal pain.  Skin. Appears grossly unremarkable.  Musculoskeletal. Grossly unremarkable.  Neurologic. Grossly unremarkable.   Chemistry: Complete metabolic panel with mild elevation of alkaline phosphatase, otherwise unremarkable.  Heme: Modest leukocytosis. Mild anemia stable.  ID: Urinalysis equivocal.  Imaging: CT imaging suggested early diverticulitis.  Plan admission for acute diverticulitis, IV antibiotics, supportive care. No signs, symptoms or imaging to suggest complicating features at this point.  [name], MD [facility] [phone]

## 2013-10-19 NOTE — H&P (Signed)
[facility] History and Physical  Stephanie [name] ZOX:[id number] DOB: 1950/04/25 DOA: 10/19/2013  Referring physician:  PCP: No PCP Per Patient   Chief Complaint: abdominal pain  HPI: Stephanie [name] is a 63 y.o. female with a past history that includes hypertension, diabetes, hypothyroidism, GERD, COPD, presents to the emergency department with the chief complaint of worsening abdominal pain. Initial evaluation in the emergency department reveals diverticulitis.  Patient reports she developed abdominal pain about 4 days ago. She describes the pain as cramp-like it is constant. Pain in the lower quadrants to thoroughly on the left. Associated symptoms include nausea without vomiting and decreased by mouth intake. She denies fever or chills. She denies chest pain palpitation shortness of breath. She denies dysuria hematuria frequency or urgency. She reports she's had an episode of diverticulitis in the remote past.  Workup in the emergency department reveals a serum glucose of 259, alkaline phosphatase 177, WBC 15.7. Urinalysis with few squama is cells few bacteria 3-6 WBCs. CT of the abdomen  early diverticulitis at the junction in the left colon and sigmoid colon with mild pericolonic stranding and mild diverticular change in the sigmoid colon. No free fluid or free air. Hepatic steatosis Normal appendix. In the emergency department she is given 1 L of normal saline, 400 mg of Cipro, 4 mg of morphine and 4 mg of Zofran.  At the time of my exam she is hemodynamically stable afebrile and nontoxic appearing although she is clearly uncomfortable.   Review of Systems:  10 point review of systems completed and all systems are negative except as indicated in the history of present illness  Past Medical History  Diagnosis Date  . Diabetes mellitus   . Hypertension   . Fibromyalgia   . Asthma   . PONV (postoperative nausea and vomiting)   . Hyperthyroidism   . Anemia   . H/O hiatal hernia   .  GERD (gastroesophageal reflux disease)   . COPD (chronic obstructive pulmonary disease)    Past Surgical History  Procedure Laterality Date  . Cholecystectomy    . Abdominal hysterectomy  partial  . Carpal tunnel release Right 1991  . Wrist ganglion excision Left   . Cataract extraction w/phaco Right 05/08/2013    Procedure: CATARACT EXTRACTION PHACO AND INTRAOCULAR LENS PLACEMENT (IOC);  Surgeon: Tonny Branch, MD;  Location: [facility];  Service: Ophthalmology;  Laterality: Right;  CDE 10.31  . Cataract extraction w/phaco Left 08/17/2013    Procedure: CATARACT EXTRACTION PHACO AND INTRAOCULAR LENS PLACEMENT (IOC);  Surgeon: Tonny Branch, MD;  Location: [facility];  Service: Ophthalmology;  Laterality: Left;  CDE:9.03   Social History:  reports that she has been smoking Cigarettes.  She has a 7.5 pack-year smoking history. She does not have any smokeless tobacco history on file. She reports that she does not drink alcohol or use illicit drugs. She is independent with ADLs Allergies  Allergen Reactions  . Tetracyclines & Related Anaphylaxis  . Banana Hives and Nausea And Vomiting  . Penicillins Rash    No family history on file. family medical history reviewed and noncontributory to the admission to  Prior to Admission medications   Medication Sig Start Date End Date Taking? Authorizing Provider  albuterol (PROVENTIL HFA;VENTOLIN HFA) 108 (90 BASE) MCG/ACT inhaler Inhale 2 puffs into the lungs every 6 (six) hours as needed for wheezing or shortness of breath.   Yes Historical Provider, MD  aspirin EC 81 MG tablet Take 81 mg by mouth at bedtime.  Yes Historical Provider, MD  Bilberry, Vaccinium myrtillus, 100 MG CAPS Take 1 capsule by mouth 2 (two) times daily.   Yes Historical Provider, MD  Cholecalciferol (VITAMIN D) 1000 UNITS capsule Take 1,000 Units by mouth 2 (two) times daily.    Yes Historical Provider, MD  Cyanocobalamin (VITAMIN B-12 PO) Take 1 tablet by mouth daily.     Yes Historical  Provider, MD  cyclobenzaprine (FLEXERIL) 10 MG tablet Take 10 mg by mouth 3 (three) times daily as needed for muscle spasms.   Yes Historical Provider, MD  insulin aspart (NOVOLOG FLEXPEN) 100 UNIT/ML FlexPen Inject 8 Units into the skin 3 (three) times daily with meals. Add 2 units if 300 or above.   Yes Historical Provider, MD  Insulin Detemir (LEVEMIR FLEXPEN) 100 UNIT/ML Pen Inject 60 Units into the skin 2 (two) times daily.    Yes Historical Provider, MD  lisinopril-hydrochlorothiazide (PRINZIDE,ZESTORETIC) 20-12.5 MG per tablet Take 1 tablet by mouth daily.   Yes Historical Provider, MD  Melatonin 3 MG TABS Take 3 mg by mouth at bedtime as needed (insomnia).   Yes Historical Provider, MD  metFORMIN (GLUCOPHAGE) 1000 MG tablet Take 1,000 mg by mouth 2 (two) times daily with a meal.     Yes Historical Provider, MD  metoprolol (LOPRESSOR) 50 MG tablet Take 50 mg by mouth 2 (two) times daily.   Yes Historical Provider, MD  naproxen (NAPROSYN) 500 MG tablet Take 500 mg by mouth 2 (two) times daily with a meal.   Yes Historical Provider, MD  pantoprazole (PROTONIX) 20 MG tablet Take 20 mg by mouth daily.   Yes Historical Provider, MD  pregabalin (LYRICA) 75 MG capsule Take 75 mg by mouth 2 (two) times daily.   Yes Historical Provider, MD  sitaGLIPtin-metformin (JANUMET) 50-1000 MG per tablet Take 1 tablet by mouth 2 (two) times daily with a meal.   Yes Historical Provider, MD  vitamin C (ASCORBIC ACID) 500 MG tablet Take 1,000 mg by mouth daily.   Yes Historical Provider, MD  vitamin E 400 UNIT capsule Take 400 Units by mouth daily.   Yes Historical Provider, MD   Physical Exam: Filed Vitals:   10/19/13 0867 10/19/13 0846 10/19/13 1025  BP: 121/79 149/87 120/73  Pulse: 114 110 101  Temp: 98.3 F (36.8 C)    TempSrc: Oral Oral   Resp: 20 20 16   Height: 5\' 6"  (1.676 m)    Weight: 106.142 kg (234 lb)    SpO2: 98% 98% 95%    Wt Readings from Last 3 Encounters:  10/19/13 106.142 kg (234 lb)    08/17/13 105.688 kg (233 lb)  08/17/13 105.688 kg (233 lb)    General:  Appears calm but somewhat uncomfortable Eyes: PERRL, normal lids, irises & conjunctiva ENT: grossly normal hearing, because membranes of her mouth are pink slightly dry. Very poor dentition Neck: no LAD, masses or thyromegaly Cardiovascular: Tachycardic but regular. I hear no murmur no gallop no rub. No lower extremity edema  Respiratory: Normal effort. Breath sounds are slightly distant but clear bilaterally. I hear no wheeze no Abdomen: Obese soft diffuse tenderness to mild palpation worse in the lower quadrants particularly on the left. No guarding no rebounding. Bowel sounds are sluggish Skin: no rash or induration seen on limited exam Musculoskeletal: grossly normal tone BUE/BLE Psychiatric: grossly normal mood and affect, speech fluent and appropriate Neurologic: grossly non-focal.          Labs on Admission:  Basic Metabolic Panel:  Recent  Labs Lab 10/19/13 0648  NA 137  K 4.3  CL 101  CO2 24  GLUCOSE 259*  BUN 12  CREATININE 0.88  CALCIUM 9.2   Liver Function Tests:  Recent Labs Lab 10/19/13 0648  AST 20  ALT 30  ALKPHOS 177*  BILITOT 0.3  PROT 8.5*  ALBUMIN 3.4*    Recent Labs Lab 10/19/13 0648  LIPASE 23   No results found for this basename: AMMONIA,  in the last 168 hours CBC:  Recent Labs Lab 10/19/13 0648  WBC 15.7*  NEUTROABS 10.2*  HGB 11.7*  HCT 34.9*  MCV 86.4  PLT 261   Cardiac Enzymes: No results found for this basename: CKTOTAL, CKMB, CKMBINDEX, TROPONINI,  in the last 168 hours  BNP (last 3 results) No results found for this basename: PROBNP,  in the last 8760 hours CBG: No results found for this basename: GLUCAP,  in the last 168 hours  Radiological Exams on Admission: Ct Abdomen Pelvis W Contrast  10/19/2013   CLINICAL DATA:  Pelvic pain  EXAM: CT ABDOMEN AND PELVIS WITH CONTRAST  TECHNIQUE: Multidetector CT imaging of the abdomen and pelvis was  performed using the standard protocol following bolus administration of intravenous contrast.  CONTRAST:  57mL OMNIPAQUE IOHEXOL 300 MG/ML SOLN, 118mL OMNIPAQUE IOHEXOL 300 MG/ML SOLN  COMPARISON:  None.  FINDINGS: Lung bases are clear.  Heart size is normal.  Hepatic steatosis. No focal liver lesion. Gallbladder not visualized secondary to cholecystectomy. Bile ducts nondilated.  Pancreas and spleen are normal. Kidneys are normal without obstruction mass or stone  Negative for bowel obstruction. Mild sigmoid diverticulosis. Mild stranding in the pericolonic fat at the junction of the left colon and sigmoid colon may represent early diverticulitis. However, this could also be sequela prior diverticulitis. No abscess or free air. No free fluid.  The cecum extends into the pelvis. The appendix is filled with gas and is not thickened. Normal appearing appendix.  Multilevel disc and facet degeneration in the lumbar spine. Grade 1 anterior slip L4-5. Negative for lumbar fracture.  IMPRESSION: Question early diverticulitis at the junction in the left colon and sigmoid colon with mild pericolonic stranding and mild diverticular change in the sigmoid colon. No free fluid or free air.  Hepatic steatosis  Normal appendix   Electronically Signed   By: Franchot Gallo M.D.   On: 10/19/2013 08:57    EKG:   Assessment/Plan Principal Problem:   Diverticulitis: Will admit to medical unit. Will provide supportive therapy in the form of pain medicine and anti-emetic as well as IV fluids. Will keep n.p.o. for now. Will continue Cipro and Flagyl that was initiated in the emergency department. No evidence of complicating features at this time. Will monitor closely. Active Problems:   Hypertension: Only fair control in the emergency department. I suspect pain contributing to this. Her home medications include lisinopril, hydrochlorothiazide, metoprolol. We will hold these for now do to her n.p.o. status. I will provide metoprolol  IV scheduled with parameters. Will monitor blood pressure closely and adjust as indicated.  Tachycardia. Likely related to pain and some mild dehydration. Will provide IV fluids gently. She is on a beta blocker at home as well. Will provide metoprolol intravenously with parameters. Will adjust dose as indicated  Diabetes. Will obtain a hemoglobin A1c. Home medications include NovoLog and Levemir or with metformin and Janumet. I will hold oral agents and provide Levemir at half her home dose. Will use sliding scale insulin for optimal control. Monitor  closely    Hyperthyroidism: Will obtain a TSH. Hold Synthroid for now    GERD (gastroesophageal reflux disease): I medications include Protonix. Will hold this for now.    COPD (chronic obstructive pulmonary disease): Patient uses an inhaler at home. She appears to be at her baseline. She is a long-term smoker. Will continue her inhaler    Tobacco abuse: Cessation counseling offered.    Obesity: BMI 37.8. Will request nutritional counseling       Code Status: full DVT Prophylaxis: Family Communication: none present Disposition Plan: home when clinically ready  Time spent: 60 minutes  [facility] Pager [phone]  **Disclaimer: This note may have been dictated with voice recognition software. Similar sounding words can inadvertently be transcribed and this note may contain transcription errors which may not have been corrected upon publication of note.**

## 2013-10-19 NOTE — ED Provider Notes (Signed)
CSN: [id number]     Arrival date & time 10/19/13  0619 History  This chart was scribed for Maudry Diego, MD by Lowella Petties, ED Scribe. The patient was seen in room APA09/APA09. Patient's care was started at 7:12 AM.  Chief Complaint  Patient presents with  . Abdominal Pain   Patient is a 63 y.o. female presenting with abdominal pain. The history is provided by the patient. No language interpreter was used.  Abdominal Pain Pain location:  LLQ and RLQ Pain quality: aching and sharp   Pain severity:  Moderate Onset quality:  Gradual Duration:  1 day Timing:  Constant Progression:  Worsening Chronicity:  Recurrent (previously 20 years ago) Relieved by:  Nothing Worsened by:  Nothing tried Ineffective treatments:  None tried Associated symptoms: no chest pain, no cough, no diarrhea, no dysuria, no fatigue, no hematuria, no nausea and no vomiting     HPI Comments: Stephanie [name] is a 63 y.o. female who presents to the Emergency Department complaining lower abdominal pain onset yesterday and acutely worsened this morning. She reports experiencing similar symptoms 20 years ago and having to be admitted at this time. Patient denies urinary symptoms and had a normal bowel movement this morning.   Past Medical History  Diagnosis Date  . Diabetes mellitus   . Hypertension   . Fibromyalgia   . Asthma   . PONV (postoperative nausea and vomiting)   . Hyperthyroidism   . Anemia   . H/O hiatal hernia   . GERD (gastroesophageal reflux disease)   . COPD (chronic obstructive pulmonary disease)    Past Surgical History  Procedure Laterality Date  . Cholecystectomy    . Abdominal hysterectomy  partial  . Carpal tunnel release Right 1991  . Wrist ganglion excision Left   . Cataract extraction w/phaco Right 05/08/2013    Procedure: CATARACT EXTRACTION PHACO AND INTRAOCULAR LENS PLACEMENT (IOC);  Surgeon: Tonny Branch, MD;  Location: [facility];  Service: Ophthalmology;  Laterality: Right;  CDE 10.31   . Cataract extraction w/phaco Left 08/17/2013    Procedure: CATARACT EXTRACTION PHACO AND INTRAOCULAR LENS PLACEMENT (IOC);  Surgeon: Tonny Branch, MD;  Location: [facility];  Service: Ophthalmology;  Laterality: Left;  CDE:9.03   No family history on file. History  Substance Use Topics  . Smoking status: Current Some Day Smoker -- 0.25 packs/day for 30 years    Types: Cigarettes  . Smokeless tobacco: Not on file  . Alcohol Use: No   OB History   Grav Para Term Preterm Abortions TAB SAB Ect Mult Living                 Review of Systems  Constitutional: Negative for appetite change and fatigue.  HENT: Negative for congestion, ear discharge and sinus pressure.   Eyes: Negative for discharge.  Respiratory: Negative for cough.   Cardiovascular: Negative for chest pain.  Gastrointestinal: Positive for abdominal pain. Negative for nausea, vomiting and diarrhea.  Genitourinary: Negative for dysuria, frequency and hematuria.  Musculoskeletal: Negative for back pain.  Skin: Negative for rash.  Neurological: Negative for seizures and headaches.  Psychiatric/Behavioral: Negative for hallucinations.   Allergies  Tetracyclines & related; Banana; and Penicillins  Home Medications   Prior to Admission medications   Medication Sig Start Date End Date Taking? Authorizing Provider  albuterol (PROVENTIL HFA;VENTOLIN HFA) 108 (90 BASE) MCG/ACT inhaler Inhale 2 puffs into the lungs every 6 (six) hours as needed for wheezing or shortness of breath.  Yes Historical Provider, MD  aspirin EC 81 MG tablet Take 81 mg by mouth daily.     Yes Historical Provider, MD  Bilberry, Vaccinium myrtillus, 100 MG CAPS Take 1 capsule by mouth 2 (two) times daily.   Yes Historical Provider, MD  Cholecalciferol (VITAMIN D) 1000 UNITS capsule Take 1,000 Units by mouth 2 (two) times daily.    Yes Historical Provider, MD  Cyanocobalamin (VITAMIN B-12 PO) Take 1 tablet by mouth daily.     Yes Historical Provider, MD   cyclobenzaprine (FLEXERIL) 10 MG tablet Take 10 mg by mouth 3 (three) times daily as needed for muscle spasms.   Yes Historical Provider, MD  insulin aspart (NOVOLOG FLEXPEN) 100 UNIT/ML FlexPen Inject 8 Units into the skin 3 (three) times daily with meals. Add 2 units if 300 or above.   Yes Historical Provider, MD  Insulin Detemir (LEVEMIR FLEXPEN) 100 UNIT/ML Pen Inject 60 Units into the skin 2 (two) times daily.    Yes Historical Provider, MD  lisinopril-hydrochlorothiazide (PRINZIDE,ZESTORETIC) 20-12.5 MG per tablet Take 1 tablet by mouth daily.   Yes Historical Provider, MD  metFORMIN (GLUCOPHAGE) 1000 MG tablet Take 1,000 mg by mouth 2 (two) times daily with a meal.     Yes Historical Provider, MD  metoprolol tartrate (LOPRESSOR) 25 MG tablet Take 25 mg by mouth 2 (two) times daily.   Yes Historical Provider, MD  naproxen (NAPROSYN) 500 MG tablet Take 500 mg by mouth 2 (two) times daily with a meal.   Yes Historical Provider, MD  pantoprazole (PROTONIX) 40 MG tablet Take 40 mg by mouth daily.   Yes Historical Provider, MD  pregabalin (LYRICA) 75 MG capsule Take 75 mg by mouth 2 (two) times daily.   Yes Historical Provider, MD  VITAMIN E BLEND PO Take 1 capsule by mouth daily.     Yes Historical Provider, MD   Triage Vitals: BP 121/79  Pulse 114  Temp(Src) 98.3 F (36.8 C) (Oral)  Resp 20  Ht 5\' 6"  (1.676 m)  Wt 234 lb (106.142 kg)  BMI 37.79 kg/m2  SpO2 98% Physical Exam  Nursing note and vitals reviewed. Constitutional: She is oriented to person, place, and time. She appears well-developed.  HENT:  Head: Normocephalic.  Eyes: Conjunctivae and EOM are normal. No scleral icterus.  Neck: Neck supple. No thyromegaly present.  Cardiovascular: Normal rate and regular rhythm.  Exam reveals no gallop and no friction rub.   No murmur heard. Pulmonary/Chest: No stridor. She has no wheezes. She has no rales. She exhibits no tenderness.  Abdominal: She exhibits no distension. There is  tenderness (moderate, LLQ). There is no rebound.  Musculoskeletal: Normal range of motion. She exhibits no edema.  Lymphadenopathy:    She has no cervical adenopathy.  Neurological: She is oriented to person, place, and time. She exhibits normal muscle tone. Coordination normal.  Skin: No rash noted. No erythema.  Psychiatric: She has a normal mood and affect. Her behavior is normal.    ED Course  Procedures (including critical care time) DIAGNOSTIC STUDIES: Oxygen Saturation is 98% on room air, normal by my interpretation.    COORDINATION OF CARE: 7:15 AM-Discussed treatment plan which includes X-Ray with pt at bedside and pt agreed to plan.   Labs Review Labs Reviewed  CBC WITH DIFFERENTIAL - Abnormal; Notable for the following:    WBC 15.7 (*)    Hemoglobin 11.7 (*)    HCT 34.9 (*)    Neutro Abs 10.2 (*)  All other components within normal limits  COMPREHENSIVE METABOLIC PANEL - Abnormal; Notable for the following:    Glucose, Bld 259 (*)    Total Protein 8.5 (*)    Albumin 3.4 (*)    Alkaline Phosphatase 177 (*)    GFR calc non Af Amer 68 (*)    GFR calc Af Amer 79 (*)    All other components within normal limits  URINALYSIS, ROUTINE W REFLEX MICROSCOPIC - Abnormal; Notable for the following:    Glucose, UA >1000 (*)    Leukocytes, UA TRACE (*)    All other components within normal limits  URINE MICROSCOPIC-ADD ON - Abnormal; Notable for the following:    Squamous Epithelial / LPF FEW (*)    Bacteria, UA FEW (*)    All other components within normal limits  LIPASE, BLOOD    Imaging Review No results found.   EKG Interpretation None      MDM   Final diagnoses:  None    Admit for diverticulits  The chart was scribed for me under my direct supervision.  I personally performed the history, physical, and medical decision making and all procedures in the evaluation of this patient.Maudry Diego, MD 10/19/13 1028

## 2013-10-19 NOTE — ED Notes (Signed)
Pt reports lower left abdominal pain that started yesterday. States the pain has been coming & going but has become worse this morning. Pt denies any urinary symptoms & had a normal bowel movement this morning.

## 2013-10-19 NOTE — ED Provider Notes (Signed)
MSE was initiated and I personally evaluated the patient and placed orders (if any) at  6:37 AM on October 19, 2013.  The patient appears stable so that the remainder of the MSE may be completed by another provider.  Sharyon Cable, MD 10/19/13 [phone]

## 2013-10-19 NOTE — ED Notes (Signed)
Pt concerned about oral contrast, has allergies to Banana. Called [facility] Malta in [place], she confirmed that the flavoring is artificial. Pt instructed to call nurse if she feels nauseated or itching/hives. Pt verbalized understanding.

## 2013-10-20 LAB — CBC
HCT: 31.4 % — ABNORMAL LOW (ref 36.0–46.0)
Hemoglobin: 10.3 g/dL — ABNORMAL LOW (ref 12.0–15.0)
MCH: 28.4 pg (ref 26.0–34.0)
MCHC: 32.8 g/dL (ref 30.0–36.0)
MCV: 86.5 fL (ref 78.0–100.0)
Platelets: 216 10*3/uL (ref 150–400)
RBC: 3.63 MIL/uL — ABNORMAL LOW (ref 3.87–5.11)
RDW: 15.1 % (ref 11.5–15.5)
WBC: 9.1 10*3/uL (ref 4.0–10.5)

## 2013-10-20 LAB — GLUCOSE, CAPILLARY
GLUCOSE-CAPILLARY: 146 mg/dL — AB (ref 70–99)
GLUCOSE-CAPILLARY: 220 mg/dL — AB (ref 70–99)
Glucose-Capillary: 220 mg/dL — ABNORMAL HIGH (ref 70–99)
Glucose-Capillary: 205 mg/dL — ABNORMAL HIGH (ref 70–99)

## 2013-10-20 LAB — BASIC METABOLIC PANEL
ANION GAP: 10 (ref 5–15)
BUN: 9 mg/dL (ref 6–23)
CALCIUM: 8.2 mg/dL — AB (ref 8.4–10.5)
CO2: 25 mEq/L (ref 19–32)
Chloride: 104 mEq/L (ref 96–112)
Creatinine, Ser: 0.8 mg/dL (ref 0.50–1.10)
GFR, EST AFRICAN AMERICAN: 89 mL/min — AB (ref >90)
GFR, EST NON AFRICAN AMERICAN: 77 mL/min — AB (ref >90)
Glucose, Bld: 158 mg/dL — ABNORMAL HIGH (ref 70–99)
POTASSIUM: 3.8 meq/L (ref 3.7–5.3)
SODIUM: 139 meq/L (ref 137–147)

## 2013-10-20 MED ORDER — METOPROLOL TARTRATE 50 MG PO TABS
50.0000 mg | ORAL_TABLET | Freq: Two times a day (BID) | ORAL | Status: DC
  Administered 2013-10-20 – 2013-10-22 (×4): 50 mg via ORAL
  Filled 2013-10-20 (×5): qty 1

## 2013-10-20 MED ORDER — PANTOPRAZOLE SODIUM 40 MG PO TBEC
40.0000 mg | DELAYED_RELEASE_TABLET | Freq: Every day | ORAL | Status: DC
  Administered 2013-10-20 – 2013-10-22 (×3): 40 mg via ORAL
  Filled 2013-10-20 (×3): qty 1

## 2013-10-20 MED ORDER — PREGABALIN 75 MG PO CAPS
75.0000 mg | ORAL_CAPSULE | Freq: Two times a day (BID) | ORAL | Status: DC
  Administered 2013-10-20 – 2013-10-22 (×4): 75 mg via ORAL
  Filled 2013-10-20 (×4): qty 1

## 2013-10-20 MED ORDER — LISINOPRIL-HYDROCHLOROTHIAZIDE 20-12.5 MG PO TABS: 1.0000 | ORAL_TABLET | Freq: Every day | ORAL | Status: DC

## 2013-10-20 MED ORDER — LISINOPRIL 10 MG PO TABS
20.0000 mg | ORAL_TABLET | Freq: Every day | ORAL | Status: DC
  Administered 2013-10-20 – 2013-10-22 (×3): 20 mg via ORAL
  Filled 2013-10-20 (×3): qty 2

## 2013-10-20 MED ORDER — INSULIN DETEMIR 100 UNIT/ML ~~LOC~~ SOLN
40.0000 [IU] | Freq: Two times a day (BID) | SUBCUTANEOUS | Status: DC
  Administered 2013-10-20 – 2013-10-22 (×4): 40 [IU] via SUBCUTANEOUS
  Filled 2013-10-20 (×6): qty 0.4

## 2013-10-20 MED ORDER — HYDROCHLOROTHIAZIDE 12.5 MG PO CAPS
12.5000 mg | ORAL_CAPSULE | Freq: Every day | ORAL | Status: DC
  Administered 2013-10-20 – 2013-10-22 (×3): 12.5 mg via ORAL
  Filled 2013-10-20 (×3): qty 1

## 2013-10-20 MED ORDER — ASPIRIN EC 81 MG PO TBEC
81.0000 mg | DELAYED_RELEASE_TABLET | Freq: Every day | ORAL | Status: DC
  Administered 2013-10-20 – 2013-10-21 (×2): 81 mg via ORAL
  Filled 2013-10-20 (×2): qty 1

## 2013-10-20 NOTE — Progress Notes (Signed)
UR review complete.  

## 2013-10-20 NOTE — Progress Notes (Signed)
  PROGRESS NOTE  Stephanie [name]:[id number] DOB: March 01, 1950 DOA: 10/19/2013 PCP: No PCP Per Patient  Summary: 63 year old woman present with one week history of progressive left lower quadrant abdominal pain. Admitted for early diverticulitis.  Assessment/Plan: 1. Acute diverticulitis with ongoing nausea. Improved today, tolerating liquids.  2. Diabetes mellitus type 2. Stable. 3. History of COPD 4. Tobacco dependence.   Overall somewhat improved. Continue liquids, empiric antibiotics. Home when better.  Code Status: full code DVT prophylaxis: Lovenox Family Communication:  Disposition Plan: home  [name], MD  [facility]  Pager [phone] If 7PM-7AM, please contact night-coverage at [email or web address], password [facility] 10/20/2013, 4:09 PM  LOS: 1 day   Consultants:    Procedures:    Antibiotics:  Ciprofloxacin 9/3 >>   Flagyl 9/3 >>   HPI/Subjective: Feels somewhat better today with less abdominal pain. Nausea persists but she is tolerating liquids.  Objective: Filed Vitals:   10/19/13 1459 10/19/13 2103 10/20/13 0630 10/20/13 1437  BP: 122/74 121/57 114/62 150/78  Pulse: 101 98 104 122  Temp: 98.6 F (37 C) 97.9 F (36.6 C) 98.1 F (36.7 C) 98.2 F (36.8 C)  TempSrc: Oral Oral Oral Oral  Resp: 20 20 20 20   Height:      Weight:      SpO2: 100% 98% 99% 100%    Intake/Output Summary (Last 24 hours) at 10/20/13 1609 Last data filed at 10/20/13 1234  Gross per 24 hour  Intake   1600 ml  Output   1050 ml  Net    550 ml     Filed Weights   10/19/13 0632 10/19/13 1113  Weight: 106.142 kg (234 lb) 60.782 kg (134 lb)    Exam:     Afebrile, vital signs are stable. No hypoxia. Gen. Appears mildly uncomfortable but not toxic. Calm.  Psych. Alert. Speech fluent and appropriate.  Cardiovascular. Regular rate and rhythm. No murmur, rub, gallop.  Respiratory. Clear to auscultation bilaterally. No rhonchi, wheezes, rales.  Abdomen. Positive bowel  sounds. Soft, mild to moderate left lower quadrant abdominal pain.  Data Reviewed: I/O: BXI 3568  Chemistry: Basic metabolic panel unremarkable. Hemoglobin A1c 10.8.  Heme: Leukocytosis resolved. Hemoglobin stable 10.3.  Scheduled Meds: . ciprofloxacin  400 mg Intravenous Q12H  . enoxaparin (LOVENOX) injection  40 mg Subcutaneous Q24H  . insulin aspart  0-15 Units Subcutaneous TID WC  . insulin aspart  0-5 Units Subcutaneous QHS  . insulin detemir  30 Units Subcutaneous BID  . metronidazole  500 mg Intravenous Q8H   Continuous Infusions: . sodium chloride 75 mL/hr at 10/19/13 1306    Principal Problem:   Diverticulitis Active Problems:   Hypertension   Hyperthyroidism   GERD (gastroesophageal reflux disease)   COPD (chronic obstructive pulmonary disease)   Tobacco abuse   Obesity   Diabetes   Sinus tachycardia   Leukocytosis   Time spent 15 minutes

## 2013-10-20 NOTE — Care Management Note (Unsigned)
    Page 1 of 1   10/20/2013     4:45:31 PM CARE MANAGEMENT NOTE 10/20/2013  Patient:  Stephanie [name], Stephanie [name]   Account Number:  [id number]  Date Initiated:  10/20/2013  Documentation initiated by:  Vladimir Creeks  Subjective/Objective Assessment:   Pt admitted with diverticulitis. She is from home, is independent, lives with family, and will return home at D/C     Action/Plan:   will follow for needs   Anticipated DC Date:  10/22/2013   Anticipated DC Plan:  [place]  CM consult      Choice offered to / List presented to:             Status of service:  In process, will continue to follow Medicare Important Message given?   (If response is "NO", the following Medicare IM given date fields will be blank) Date Medicare IM given:   Medicare IM given by:   Date Additional Medicare IM given:   Additional Medicare IM given by:    Discharge Disposition:    Per UR Regulation:  Reviewed for med. necessity/level of care/duration of stay  If discussed at [place] of Stay Meetings, dates discussed:    Comments:  10/20/13 [place] RN/CM

## 2013-10-21 DIAGNOSIS — F17200 Nicotine dependence, unspecified, uncomplicated: Secondary | ICD-10-CM

## 2013-10-21 LAB — GLUCOSE, CAPILLARY
Glucose-Capillary: 183 mg/dL — ABNORMAL HIGH (ref 70–99)
Glucose-Capillary: 267 mg/dL — ABNORMAL HIGH (ref 70–99)
Glucose-Capillary: 300 mg/dL — ABNORMAL HIGH (ref 70–99)
Glucose-Capillary: 275 mg/dL — ABNORMAL HIGH (ref 70–99)

## 2013-10-21 MED ORDER — SODIUM CHLORIDE 0.9 % IJ SOLN
3.0000 mL | INTRAMUSCULAR | Status: DC | PRN
  Administered 2013-10-21: 3 mL via INTRAVENOUS

## 2013-10-21 MED ORDER — SODIUM CHLORIDE 0.9 % IJ SOLN
3.0000 mL | Freq: Two times a day (BID) | INTRAMUSCULAR | Status: DC
  Administered 2013-10-21 – 2013-10-22 (×3): 3 mL via INTRAVENOUS

## 2013-10-21 MED ORDER — SODIUM CHLORIDE 0.9 % IV SOLN: 250.0000 mL | INTRAVENOUS | Status: DC | PRN

## 2013-10-21 NOTE — Progress Notes (Signed)
  PROGRESS NOTE  Stephanie COGGIN [id number]:[id number] DOB: 08/04/1950 DOA: 10/19/2013 PCP: No PCP Per Patient  Summary: 63 year old woman present with one week history of progressive left lower quadrant abdominal pain. Admitted for early diverticulitis.  Assessment/Plan: 1. Acute diverticulitis with ongoing nausea. Slowly improving with less pain. Remains afebrile. 2. Diabetes mellitus type 2. Remained stable. 3. History of COPD 4. Tobacco dependence.   Overall slowly improving. Likely discharge home on oral antibiotics 9/6 if continues to improve.  Code Status: full code DVT prophylaxis: Lovenox Family Communication:  Disposition Plan: home  [name], MD  [facility]  Pager [phone] If 7PM-7AM, please contact night-coverage at [email or web address], password [facility] 10/21/2013, 11:05 AM  LOS: 2 days   Consultants:    Procedures:    Antibiotics:  Ciprofloxacin 9/3 >>   Flagyl 9/3 >>   HPI/Subjective: Less pain today although still has pain in the left lower quadrant. Bowels continue to move. Tolerating diet although she continues to have nausea. Overall somewhat better.  Objective: Filed Vitals:   10/20/13 0630 10/20/13 1437 10/20/13 2052 10/21/13 0443  BP: 114/62 150/78 131/78 132/80  Pulse: 104 122 112 89  Temp: 98.1 F (36.7 C) 98.2 F (36.8 C) 98.6 F (37 C) 98.9 F (37.2 C)  TempSrc: Oral Oral Oral Oral  Resp: 20 20 20 20   Height:      Weight:      SpO2: 99% 100% 100% 100%    Intake/Output Summary (Last 24 hours) at 10/21/13 1105 Last data filed at 10/21/13 0939  Gross per 24 hour  Intake 2091.75 ml  Output   1000 ml  Net 1091.75 ml     Filed Weights   10/19/13 0632 10/19/13 1113  Weight: 106.142 kg (234 lb) 60.782 kg (134 lb)    Exam:     Afebrile, vital signs stable. No hypoxia.  Respiratory clear to auscultation bilaterally. No wheezes, rales or rhonchi. Normal respiratory effort.  Cardiovascular regular rate and rhythm. No murmur, rub or  gallop. No lower extremity edema.  Abdomen soft, nondistended, mild left lower quadrant tenderness.  Psychiatric. Grossly normal mood and affect. Speech fluent and appropriate.  Gen. Appears calm and mildly uncomfortable.  Data Reviewed:  CBG stable.  Scheduled Meds: . aspirin EC  81 mg Oral QHS  . ciprofloxacin  400 mg Intravenous Q12H  . enoxaparin (LOVENOX) injection  40 mg Subcutaneous Q24H  . lisinopril  20 mg Oral Daily   And  . hydrochlorothiazide  12.5 mg Oral Daily  . insulin aspart  0-15 Units Subcutaneous TID WC  . insulin aspart  0-5 Units Subcutaneous QHS  . insulin detemir  40 Units Subcutaneous BID  . metoprolol  50 mg Oral BID  . metronidazole  500 mg Intravenous Q8H  . pantoprazole  40 mg Oral Daily  . pregabalin  75 mg Oral BID  . sodium chloride  3 mL Intravenous Q12H   Continuous Infusions:    Principal Problem:   Diverticulitis Active Problems:   Hypertension   Hyperthyroidism   GERD (gastroesophageal reflux disease)   COPD (chronic obstructive pulmonary disease)   Tobacco abuse   Obesity   Diabetes   Sinus tachycardia   Leukocytosis   Time spent 20 minutes

## 2013-10-22 DIAGNOSIS — D649 Anemia, unspecified: Secondary | ICD-10-CM

## 2013-10-22 LAB — GLUCOSE, CAPILLARY: GLUCOSE-CAPILLARY: 189 mg/dL — AB (ref 70–99)

## 2013-10-22 MED ORDER — CIPROFLOXACIN HCL 500 MG PO TABS: 500.0000 mg | ORAL_TABLET | Freq: Two times a day (BID) | ORAL | Status: DC

## 2013-10-22 MED ORDER — PNEUMOCOCCAL VAC POLYVALENT 25 MCG/0.5ML IJ INJ
0.5000 mL | INJECTION | Freq: Once | INTRAMUSCULAR | Status: AC
  Administered 2013-10-22: 0.5 mL via INTRAMUSCULAR
  Filled 2013-10-22: qty 0.5

## 2013-10-22 MED ORDER — HYDROCODONE-ACETAMINOPHEN 5-325 MG PO TABS: 1.0000 | ORAL_TABLET | ORAL | Status: DC | PRN

## 2013-10-22 MED ORDER — METRONIDAZOLE 500 MG PO TABS
500.0000 mg | ORAL_TABLET | Freq: Four times a day (QID) | ORAL | Status: DC
  Administered 2013-10-22: 500 mg via ORAL
  Filled 2013-10-22: qty 1

## 2013-10-22 MED ORDER — CIPROFLOXACIN HCL 250 MG PO TABS
500.0000 mg | ORAL_TABLET | Freq: Two times a day (BID) | ORAL | Status: DC
  Administered 2013-10-22: 500 mg via ORAL
  Filled 2013-10-22: qty 2

## 2013-10-22 MED ORDER — PNEUMOCOCCAL VAC POLYVALENT 25 MCG/0.5ML IJ INJ
0.5000 mL | INJECTION | INTRAMUSCULAR | Status: DC | PRN
Start: 2013-10-22 — End: 2013-10-22

## 2013-10-22 MED ORDER — PNEUMOCOCCAL VAC POLYVALENT 25 MCG/0.5ML IJ INJ
0.5000 mL | INJECTION | INTRAMUSCULAR | Status: DC
  Filled 2013-10-22: qty 0.5

## 2013-10-22 MED ORDER — METRONIDAZOLE 500 MG PO TABS: 500.0000 mg | ORAL_TABLET | Freq: Four times a day (QID) | ORAL | Status: DC

## 2013-10-22 NOTE — Progress Notes (Addendum)
  PROGRESS NOTE  Stephanie [name] [id number]:[id number] DOB: 1950-02-18 DOA: 10/19/2013 PCP: No PCP Per Patient [facility] Dept. Patient cannot recall name.  Summary: 63 year old woman present with one week history of progressive left lower quadrant abdominal pain. Admitted for early diverticulitis.  Assessment/Plan: 1. Acute diverticulitis with ongoing nausea. Continues to improve. 2. Diabetes mellitus type 2, uncontrolled by Hgb A1c 10.8. Remained stable. 3. Normocytic anemia, chronic, stable. F/u as outpatient. 4. History of COPD 5. Tobacco dependence.   Continues to improve. Plan discharge home today on oral antibiotics with outpatient followup.  Code Status: full code DVT prophylaxis: Lovenox Family Communication:  Disposition Plan: home  [name], MD  [facility]  Pager [phone] If 7PM-7AM, please contact night-coverage at [email or web address], password [facility] 10/22/2013, 8:11 AM  LOS: 3 days   Consultants:    Procedures:    Antibiotics:  Ciprofloxacin 9/3 >> 9/12  Flagyl 9/3 >> 9/12  HPI/Subjective: Continues to feel better, slowly. Some nausea but tolerating diet. No vomiting. Abdominal pain continues to decrease. Ambulating. Ready to go home.  Objective: Filed Vitals:   10/21/13 0443 10/21/13 1446 10/21/13 2201 10/22/13 0710  BP: 132/80 104/69 103/50 104/62  Pulse: 89 93 90 89  Temp: 98.9 F (37.2 C) 98.9 F (37.2 C) 98.3 F (36.8 C) 98.2 F (36.8 C)  TempSrc: Oral Oral Oral Oral  Resp: 20 20 20 20   Height:      Weight:      SpO2: 100% 100% 100% 97%    Intake/Output Summary (Last 24 hours) at 10/22/13 0811 Last data filed at 10/22/13 0549  Gross per 24 hour  Intake   1423 ml  Output      0 ml  Net   1423 ml     Filed Weights   10/19/13 0632 10/19/13 1113  Weight: 106.142 kg (234 lb) 60.782 kg (134 lb)    Exam:     Afebrile, VSS, no hypoxia. BM x3  General. Appears calm, comfortable, ambulating without difficulty.  CV: RRR no  m/r/g. No LE edema.  Resp: CTA bilaterally, no w/r/r/. Normal respiratory effort.  Abd: soft, non-distended. Mild superficial LLQ pain.  Psychiatric: alert, speech fluent and clear  Data Reviewed:  CBG stable, somewhat elevated    Scheduled Meds: . aspirin EC  81 mg Oral QHS  . ciprofloxacin  400 mg Intravenous Q12H  . enoxaparin (LOVENOX) injection  40 mg Subcutaneous Q24H  . lisinopril  20 mg Oral Daily   And  . hydrochlorothiazide  12.5 mg Oral Daily  . insulin aspart  0-15 Units Subcutaneous TID WC  . insulin aspart  0-5 Units Subcutaneous QHS  . insulin detemir  40 Units Subcutaneous BID  . metoprolol  50 mg Oral BID  . metronidazole  500 mg Intravenous Q8H  . pantoprazole  40 mg Oral Daily  . pregabalin  75 mg Oral BID  . sodium chloride  3 mL Intravenous Q12H   Continuous Infusions:    Principal Problem:   Diverticulitis Active Problems:   Hypertension   Hyperthyroidism   GERD (gastroesophageal reflux disease)   COPD (chronic obstructive pulmonary disease)   Tobacco abuse   Obesity   Diabetes   Sinus tachycardia   Leukocytosis

## 2013-10-22 NOTE — Progress Notes (Signed)
Stephanie [name] discharged to home with daughter per MD order.  Discharge instructions reviewed and discussed with the patient, all questions and concerns answered. Copy of instructions and scripts given to patient.    Medication List    STOP taking these medications       metFORMIN 1000 MG tablet  Commonly known as:  GLUCOPHAGE      TAKE these medications       albuterol 108 (90 BASE) MCG/ACT inhaler  Commonly known as:  PROVENTIL HFA;VENTOLIN HFA  Inhale 2 puffs into the lungs every 6 (six) hours as needed for wheezing or shortness of breath.     aspirin EC 81 MG tablet  Take 81 mg by mouth at bedtime.     Bilberry (Vaccinium myrtillus) 100 MG Caps  Take 1 capsule by mouth 2 (two) times daily.     ciprofloxacin 500 MG tablet  Commonly known as:  CIPRO  Take 1 tablet (500 mg total) by mouth 2 (two) times daily.     cyclobenzaprine 10 MG tablet  Commonly known as:  FLEXERIL  Take 10 mg by mouth 3 (three) times daily as needed for muscle spasms.     HYDROcodone-acetaminophen 5-325 MG per tablet  Commonly known as:  NORCO/VICODIN  Take 1 tablet by mouth every 4 (four) hours as needed for moderate pain.     LEVEMIR FLEXPEN 100 UNIT/ML Pen  Generic drug:  Insulin Detemir  Inject 60 Units into the skin 2 (two) times daily.     lisinopril-hydrochlorothiazide 20-12.5 MG per tablet  Commonly known as:  PRINZIDE,ZESTORETIC  Take 1 tablet by mouth daily.     Melatonin 3 MG Tabs  Take 3 mg by mouth at bedtime as needed (insomnia).     metoprolol 50 MG tablet  Commonly known as:  LOPRESSOR  Take 50 mg by mouth 2 (two) times daily.     metroNIDAZOLE 500 MG tablet  Commonly known as:  FLAGYL  Take 1 tablet (500 mg total) by mouth every 6 (six) hours.     naproxen 500 MG tablet  Commonly known as:  NAPROSYN  Take 500 mg by mouth 2 (two) times daily with a meal.     NOVOLOG FLEXPEN 100 UNIT/ML FlexPen  Generic drug:  insulin aspart  Inject 8 Units into the skin 3 (three) times  daily with meals. Add 2 units if 300 or above.     pantoprazole 20 MG tablet  Commonly known as:  PROTONIX  Take 20 mg by mouth daily.     pregabalin 75 MG capsule  Commonly known as:  LYRICA  Take 75 mg by mouth 2 (two) times daily.     sitaGLIPtin-metformin 50-1000 MG per tablet  Commonly known as:  JANUMET  Take 1 tablet by mouth 2 (two) times daily with a meal.     VITAMIN B-12 PO  Take 1 tablet by mouth daily.     vitamin C 500 MG tablet  Commonly known as:  ASCORBIC ACID  Take 1,000 mg by mouth daily.     Vitamin D 1000 UNITS capsule  Take 1,000 Units by mouth 2 (two) times daily.     vitamin E 400 UNIT capsule  Take 400 Units by mouth daily.        Patients skin is clean, dry and intact, no evidence of skin break down. IV site discontinued and catheter remains intact. Site without signs and symptoms of complications. Dressing and pressure applied.  No distress noted upon  discharge.  Regino Bellow 10/22/2013 11:57 AM

## 2013-10-22 NOTE — Discharge Summary (Signed)
Physician Discharge Summary  Stephanie [name] NVB:[id number] DOB: September 04, 1950 DOA: 10/19/2013  PCP: No PCP Per Patient [facility] Dept. Patient cannot recall name--she will call after getting home to provide name so that discharge summary can be faxed.  Admit date: 10/19/2013 Discharge date: 10/22/2013  Recommendations for Outpatient Follow-up:  1. Resolution of acute diverticulitis. 2. Encourage smoking cessation. 3. Normocytic anemia, chronic. Consider further evaluation as an outpatient. 4. Consider outpatient GI followup if no recent screening colonoscopy.   Follow-up Information   Follow up with [facility] Department. Schedule an appointment as soon as possible for a visit in 1 week.     Discharge Diagnoses:  1. Acute diverticulitis 2. Diabetes mellitus type 2, uncontrolled by hemoglobin A1c 3. Normocytic anemia, chronic 4. Tobacco dependence  Discharge Condition: Improved Disposition: Home  Diet recommendation: Heart healthy diabetic diet  Filed Weights   10/19/13 9977 10/19/13 1113  Weight: 106.142 kg (234 lb) 60.782 kg (134 lb)    History of present illness:  63 year old woman present with one week history of progressive left lower quadrant abdominal pain. Admitted for early diverticulitis.  Hospital Course:  Stephanie [name] was admitted for diverticulitis, treated empirically with ciprofloxacin and Flagyl with gradual improvement. CT imaging without complicating features, she is tolerating a diet and is now stable for discharge. Hospitalization was uncomplicated.   Consultants: none Procedures: none Antibiotics:  Ciprofloxacin 9/3 >> 9/12  Flagyl 9/3 >> 9/12  Discharge Instructions  Discharge Instructions   Diet - low sodium heart healthy    Complete by:  As directed      Diet Carb Modified    Complete by:  As directed      Discharge instructions    Complete by:  As directed   Be sure to finish antibiotics ciprofloxacin and metronidazole. Call your physician  or seek immediate medical attention for worsening pain, fever, constipation, vomiting or worsening of condition.     Increase activity slowly    Complete by:  As directed           Current Discharge Medication List    START taking these medications   Details  ciprofloxacin (CIPRO) 500 MG tablet Take 1 tablet (500 mg total) by mouth 2 (two) times daily. Qty: 13 tablet, Refills: 0    HYDROcodone-acetaminophen (NORCO/VICODIN) 5-325 MG per tablet Take 1 tablet by mouth every 4 (four) hours as needed for moderate pain. Qty: 30 tablet, Refills: 0    metroNIDAZOLE (FLAGYL) 500 MG tablet Take 1 tablet (500 mg total) by mouth every 6 (six) hours. Qty: 26 tablet, Refills: 0      CONTINUE these medications which have NOT CHANGED   Details  albuterol (PROVENTIL HFA;VENTOLIN HFA) 108 (90 BASE) MCG/ACT inhaler Inhale 2 puffs into the lungs every 6 (six) hours as needed for wheezing or shortness of breath.    aspirin EC 81 MG tablet Take 81 mg by mouth at bedtime.     Bilberry, Vaccinium myrtillus, 100 MG CAPS Take 1 capsule by mouth 2 (two) times daily.    Cholecalciferol (VITAMIN D) 1000 UNITS capsule Take 1,000 Units by mouth 2 (two) times daily.     Cyanocobalamin (VITAMIN B-12 PO) Take 1 tablet by mouth daily.      cyclobenzaprine (FLEXERIL) 10 MG tablet Take 10 mg by mouth 3 (three) times daily as needed for muscle spasms.    insulin aspart (NOVOLOG FLEXPEN) 100 UNIT/ML FlexPen Inject 8 Units into the skin 3 (three) times daily with meals. Add 2  units if 300 or above.    Insulin Detemir (LEVEMIR FLEXPEN) 100 UNIT/ML Pen Inject 60 Units into the skin 2 (two) times daily.     lisinopril-hydrochlorothiazide (PRINZIDE,ZESTORETIC) 20-12.5 MG per tablet Take 1 tablet by mouth daily.    Melatonin 3 MG TABS Take 3 mg by mouth at bedtime as needed (insomnia).    metoprolol (LOPRESSOR) 50 MG tablet Take 50 mg by mouth 2 (two) times daily.    naproxen (NAPROSYN) 500 MG tablet Take 500 mg by  mouth 2 (two) times daily with a meal.    pantoprazole (PROTONIX) 20 MG tablet Take 20 mg by mouth daily.    pregabalin (LYRICA) 75 MG capsule Take 75 mg by mouth 2 (two) times daily.    sitaGLIPtin-metformin (JANUMET) 50-1000 MG per tablet Take 1 tablet by mouth 2 (two) times daily with a meal.    vitamin C (ASCORBIC ACID) 500 MG tablet Take 1,000 mg by mouth daily.    vitamin E 400 UNIT capsule Take 400 Units by mouth daily.      STOP taking these medications     metFORMIN (GLUCOPHAGE) 1000 MG tablet        Allergies  Allergen Reactions  . Tetracyclines & Related Anaphylaxis  . Banana Hives and Nausea And Vomiting  . Penicillins Rash    The results of significant diagnostics from this hospitalization (including imaging, microbiology, ancillary and laboratory) are listed below for reference.    Significant Diagnostic Studies: Ct Abdomen Pelvis W Contrast  10/19/2013   CLINICAL DATA:  Pelvic pain  EXAM: CT ABDOMEN AND PELVIS WITH CONTRAST  TECHNIQUE: Multidetector CT imaging of the abdomen and pelvis was performed using the standard protocol following bolus administration of intravenous contrast.  CONTRAST:  51m OMNIPAQUE IOHEXOL 300 MG/ML SOLN, 1034mOMNIPAQUE IOHEXOL 300 MG/ML SOLN  COMPARISON:  None.  FINDINGS: Lung bases are clear.  Heart size is normal.  Hepatic steatosis. No focal liver lesion. Gallbladder not visualized secondary to cholecystectomy. Bile ducts nondilated.  Pancreas and spleen are normal. Kidneys are normal without obstruction mass or stone  Negative for bowel obstruction. Mild sigmoid diverticulosis. Mild stranding in the pericolonic fat at the junction of the left colon and sigmoid colon may represent early diverticulitis. However, this could also be sequela prior diverticulitis. No abscess or free air. No free fluid.  The cecum extends into the pelvis. The appendix is filled with gas and is not thickened. Normal appearing appendix.  Multilevel disc and facet  degeneration in the lumbar spine. Grade 1 anterior slip L4-5. Negative for lumbar fracture.  IMPRESSION: Question early diverticulitis at the junction in the left colon and sigmoid colon with mild pericolonic stranding and mild diverticular change in the sigmoid colon. No free fluid or free air.  Hepatic steatosis  Normal appendix   Electronically Signed   By: ChFranchot Gallo.D.   On: 10/19/2013 08:57   Labs: Basic Metabolic Panel:  Recent Labs Lab 10/19/13 0648 10/19/13 1157 10/20/13 0615  NA 137  --  139  K 4.3  --  3.8  CL 101  --  104  CO2 24  --  25  GLUCOSE 259*  --  158*  BUN 12  --  9  CREATININE 0.88 0.81 0.80  CALCIUM 9.2  --  8.2*   Liver Function Tests:  Recent Labs Lab 10/19/13 0648  AST 20  ALT 30  ALKPHOS 177*  BILITOT 0.3  PROT 8.5*  ALBUMIN 3.4*    Recent Labs  Lab 10/19/13 0648  LIPASE 23   CBC:  Recent Labs Lab 10/19/13 0648 10/19/13 1157 10/20/13 0615  WBC 15.7* 13.0* 9.1  NEUTROABS 10.2*  --   --   HGB 11.7* 10.3* 10.3*  HCT 34.9* 31.5* 31.4*  MCV 86.4 86.3 86.5  PLT 261 232 216   CBG:  Recent Labs Lab 10/21/13 0732 10/21/13 1127 10/21/13 1652 10/21/13 2108 10/22/13 0728  GLUCAP 183* 267* 300* 275* 189*    Principal Problem:   Diverticulitis Active Problems:   Hypertension   Hyperthyroidism   GERD (gastroesophageal reflux disease)   COPD (chronic obstructive pulmonary disease)   Tobacco abuse   Obesity   Diabetes   Sinus tachycardia   Leukocytosis   Time coordinating discharge: 25 minutes  Signed:  [name], MD [facility] 10/22/2013, 10:10 AM

## 2013-11-02 ENCOUNTER — Ambulatory Visit: Payer: BC Managed Care – PPO | Admitting: Cardiovascular Disease

## 2013-11-08 ENCOUNTER — Ambulatory Visit (INDEPENDENT_AMBULATORY_CARE_PROVIDER_SITE_OTHER): Payer: BC Managed Care – PPO | Admitting: Cardiovascular Disease

## 2013-11-08 ENCOUNTER — Encounter: Payer: Self-pay | Admitting: Cardiovascular Disease

## 2013-11-08 VITALS — BP 130/78 | HR 90 | Ht 66.0 in | Wt 238.0 lb

## 2013-11-08 DIAGNOSIS — I10 Essential (primary) hypertension: Secondary | ICD-10-CM

## 2013-11-08 DIAGNOSIS — Z7189 Other specified counseling: Secondary | ICD-10-CM | POA: Diagnosis not present

## 2013-11-08 DIAGNOSIS — E119 Type 2 diabetes mellitus without complications: Secondary | ICD-10-CM | POA: Diagnosis not present

## 2013-11-08 DIAGNOSIS — F17200 Nicotine dependence, unspecified, uncomplicated: Secondary | ICD-10-CM

## 2013-11-08 DIAGNOSIS — Z716 Tobacco abuse counseling: Secondary | ICD-10-CM

## 2013-11-08 DIAGNOSIS — R000 Tachycardia, unspecified: Secondary | ICD-10-CM

## 2013-11-08 DIAGNOSIS — E1165 Type 2 diabetes mellitus with hyperglycemia: Secondary | ICD-10-CM

## 2013-11-08 DIAGNOSIS — R002 Palpitations: Secondary | ICD-10-CM | POA: Diagnosis not present

## 2013-11-08 NOTE — Progress Notes (Signed)
[name], female   DOB: 1950/04/23, 63 y.o.   MRN: [id number]       CARDIOLOGY CONSULT NOTE  [name] MRN: [id number] DOB/AGE: Feb 24, 1950 63 y.o.  Admit date: (Not on file) Primary Physician No PCP Per Patient  Reason for Consultation: irregular heart beat  HPI: The patient is a 63 year old woman with a history of tobacco abuse, chronic normocytic anemia, essential hypertension and type 2 diabetes mellitus who was recently hospitalized for acute diverticulitis. She has been referred by the rocking [facility] [place] for the evaluation of an "irregular heartbeat".  Most recent Hgb 10.3 on 9/4. It appears she was started on metoprolol several months ago for an inappropriately elevated heart rate. ECG performed in the office today demonstrates normal sinus rhythm, heart rate 87 beats per minute and an ECG performed in March of this year also demonstrates normal sinus rhythm. An ECG performed on 08/22/2010 demonstrated sinus tachycardia, heart rate 120 beats per minute. She denies exertional chest pain and exertional shortness of breath. He has palpitations which can occur once or twice a week which are occasionally associated with some mild dizziness but she denies syncope. She occasionally has bilateral ankle swelling which is relieved with prn diuretics. She denies orthopnea and paroxysmal nocturnal dyspnea.    Allergies  Allergen Reactions  . Tetracyclines & Related Anaphylaxis  . Banana Hives and Nausea And Vomiting  . Penicillins Rash    Current Outpatient Prescriptions  Medication Sig Dispense Refill  . albuterol (PROVENTIL HFA;VENTOLIN HFA) 108 (90 BASE) MCG/ACT inhaler Inhale 2 puffs into the lungs every 6 (six) hours as needed for wheezing or shortness of breath.      Marland Kitchen aspirin EC 81 MG tablet Take 81 mg by mouth at bedtime.       Marland Kitchen BAYER CONTOUR TEST test strip       . Bilberry, Vaccinium myrtillus, 100 MG CAPS Take 1 capsule by mouth 2  (two) times daily.      . Cholecalciferol (VITAMIN D) 1000 UNITS capsule Take 1,000 Units by mouth 2 (two) times daily.       . ciprofloxacin (CIPRO) 500 MG tablet Take 1 tablet (500 mg total) by mouth 2 (two) times daily.  13 tablet  0  . Cyanocobalamin (VITAMIN B-12 PO) Take 1 tablet by mouth daily.        . cyclobenzaprine (FLEXERIL) 10 MG tablet Take 10 mg by mouth 3 (three) times daily as needed for muscle spasms.      Marland Kitchen HYDROcodone-acetaminophen (NORCO/VICODIN) 5-325 MG per tablet Take 1 tablet by mouth every 4 (four) hours as needed for moderate pain.  30 tablet  0  . insulin aspart (NOVOLOG FLEXPEN) 100 UNIT/ML FlexPen Inject 8 Units into the skin 3 (three) times daily with meals. Add 2 units if 300 or above.      . Insulin Detemir (LEVEMIR FLEXPEN) 100 UNIT/ML Pen Inject 60 Units into the skin 2 (two) times daily.       Marland Kitchen lisinopril-hydrochlorothiazide (PRINZIDE,ZESTORETIC) 20-12.5 MG per tablet Take 1 tablet by mouth daily.      . Melatonin 3 MG TABS Take 3 mg by mouth at bedtime as needed (insomnia).      . metFORMIN (GLUCOPHAGE) 1000 MG tablet       . metoprolol (LOPRESSOR) 50 MG tablet Take 50 mg by mouth 2 (two) times daily.      . metroNIDAZOLE (FLAGYL) 500 MG tablet Take 1 tablet (500 mg  total) by mouth every 6 (six) hours.  26 tablet  0  . naproxen (NAPROSYN) 500 MG tablet Take 500 mg by mouth 2 (two) times daily with a meal.      . pantoprazole (PROTONIX) 20 MG tablet Take 20 mg by mouth daily.      . pregabalin (LYRICA) 75 MG capsule Take 75 mg by mouth 2 (two) times daily.      . sitaGLIPtin-metformin (JANUMET) 50-1000 MG per tablet Take 1 tablet by mouth 2 (two) times daily with a meal.      . vitamin C (ASCORBIC ACID) 500 MG tablet Take 1,000 mg by mouth daily.      . vitamin E 400 UNIT capsule Take 400 Units by mouth daily.       No current facility-administered medications for this visit.    Past Medical History  Diagnosis Date  . Diabetes mellitus   . Hypertension     . Fibromyalgia   . Asthma   . PONV (postoperative nausea and vomiting)   . Hyperthyroidism   . Anemia   . H/O hiatal hernia   . GERD (gastroesophageal reflux disease)   . COPD (chronic obstructive pulmonary disease)     Past Surgical History  Procedure Laterality Date  . Cholecystectomy    . Abdominal hysterectomy  partial  . Carpal tunnel release Right 1991  . Wrist ganglion excision Left   . Cataract extraction w/phaco Right 05/08/2013    Procedure: CATARACT EXTRACTION PHACO AND INTRAOCULAR LENS PLACEMENT (IOC);  Surgeon: Tonny Branch, MD;  Location: [facility];  Service: Ophthalmology;  Laterality: Right;  CDE 10.31  . Cataract extraction w/phaco Left 08/17/2013    Procedure: CATARACT EXTRACTION PHACO AND INTRAOCULAR LENS PLACEMENT (IOC);  Surgeon: Tonny Branch, MD;  Location: [facility];  Service: Ophthalmology;  Laterality: Left;  CDE:9.03    History   Social History  . Marital Status: Legally Separated    Spouse Name: N/A    Number of Children: N/A  . Years of Education: N/A   Occupational History  . Not on file.   Social History Main Topics  . Smoking status: Current Some Day Smoker -- 0.25 packs/day for 30 years    Types: Cigarettes  . Smokeless tobacco: Never Used     Comment: some day smoker  . Alcohol Use: No  . Drug Use: No  . Sexual Activity: Yes    Birth Control/ Protection: Surgical   Other Topics Concern  . Not on file   Social History Narrative  . No narrative on file     No family history of premature CAD in 1st degree relatives.  Prior to Admission medications   Medication Sig Start Date End Date Taking? Authorizing Provider  albuterol (PROVENTIL HFA;VENTOLIN HFA) 108 (90 BASE) MCG/ACT inhaler Inhale 2 puffs into the lungs every 6 (six) hours as needed for wheezing or shortness of breath.    Historical Provider, MD  aspirin EC 81 MG tablet Take 81 mg by mouth at bedtime.     Historical Provider, MD  BAYER CONTOUR TEST test strip  11/06/13   Historical  Provider, MD  Bilberry, Vaccinium myrtillus, 100 MG CAPS Take 1 capsule by mouth 2 (two) times daily.    Historical Provider, MD  Cholecalciferol (VITAMIN D) 1000 UNITS capsule Take 1,000 Units by mouth 2 (two) times daily.     Historical Provider, MD  ciprofloxacin (CIPRO) 500 MG tablet Take 1 tablet (500 mg total) by mouth 2 (two) times daily. 10/22/13  Samuella Cota, MD  Cyanocobalamin (VITAMIN B-12 PO) Take 1 tablet by mouth daily.      Historical Provider, MD  cyclobenzaprine (FLEXERIL) 10 MG tablet Take 10 mg by mouth 3 (three) times daily as needed for muscle spasms.    Historical Provider, MD  HYDROcodone-acetaminophen (NORCO/VICODIN) 5-325 MG per tablet Take 1 tablet by mouth every 4 (four) hours as needed for moderate pain. 10/22/13   Samuella Cota, MD  insulin aspart (NOVOLOG FLEXPEN) 100 UNIT/ML FlexPen Inject 8 Units into the skin 3 (three) times daily with meals. Add 2 units if 300 or above.    Historical Provider, MD  Insulin Detemir (LEVEMIR FLEXPEN) 100 UNIT/ML Pen Inject 60 Units into the skin 2 (two) times daily.     Historical Provider, MD  lisinopril-hydrochlorothiazide (PRINZIDE,ZESTORETIC) 20-12.5 MG per tablet Take 1 tablet by mouth daily.    Historical Provider, MD  Melatonin 3 MG TABS Take 3 mg by mouth at bedtime as needed (insomnia).    Historical Provider, MD  metFORMIN (GLUCOPHAGE) 1000 MG tablet  09/14/13   Historical Provider, MD  metoprolol (LOPRESSOR) 50 MG tablet Take 50 mg by mouth 2 (two) times daily.    Historical Provider, MD  metroNIDAZOLE (FLAGYL) 500 MG tablet Take 1 tablet (500 mg total) by mouth every 6 (six) hours. 10/22/13   Samuella Cota, MD  naproxen (NAPROSYN) 500 MG tablet Take 500 mg by mouth 2 (two) times daily with a meal.    Historical Provider, MD  pantoprazole (PROTONIX) 20 MG tablet Take 20 mg by mouth daily.    Historical Provider, MD  pregabalin (LYRICA) 75 MG capsule Take 75 mg by mouth 2 (two) times daily.    Historical Provider, MD    sitaGLIPtin-metformin (JANUMET) 50-1000 MG per tablet Take 1 tablet by mouth 2 (two) times daily with a meal.    Historical Provider, MD  vitamin C (ASCORBIC ACID) 500 MG tablet Take 1,000 mg by mouth daily.    Historical Provider, MD  vitamin E 400 UNIT capsule Take 400 Units by mouth daily.    Historical Provider, MD     Review of systems complete and found to be negative unless listed above in HPI     Physical exam Height 5' 6"  (1.676 m), weight 238 lb (107.956 kg). General: NAD Neck: No JVD, no thyromegaly or thyroid nodule.  Lungs: Clear to auscultation bilaterally with normal respiratory effort. CV: Nondisplaced PMI. Regular rate and rhythm, normal S1/S2, no S3/S4, no murmur. Trace periankle edema b/l.  No carotid bruit.  Normal pedal pulses.  Abdomen: Soft, nontender, no hepatosplenomegaly, no distention.  Skin: Intact without lesions or rashes.  Neurologic: Alert and oriented x 3.  Psych: Normal affect. Extremities: No clubbing or cyanosis.  HEENT: Normal.   ECG: Most recent ECG reviewed.  Labs:   Lab Results  Component Value Date   WBC 9.1 10/20/2013   HGB 10.3* 10/20/2013   HCT 31.4* 10/20/2013   MCV 86.5 10/20/2013   PLT 216 10/20/2013   No results found for this basename: NA, K, CL, CO2, BUN, CREATININE, CALCIUM, LABALBU, PROT, BILITOT, ALKPHOS, ALT, AST, GLUCOSE,  in the last 168 hours Lab Results  Component Value Date   CKTOTAL 139 08/22/2010   CKMB 3.3 08/22/2010   TROPONINI <0.30 08/22/2010    No results found for this basename: CHOL   No results found for this basename: HDL   No results found for this basename: LDLCALC   No results found for  this basename: TRIG   No results found for this basename: CHOLHDL   No results found for this basename: LDLDIRECT         Studies: No results found.  ASSESSMENT AND PLAN:  1. Palpitations/sinus tachycardia: She may have a physiologic sinus tachycardia secondary to anemia, albeit her hemoglobin is not significantly  reduced. She is already on metoprolol 50 mg twice daily. I will obtain a two-week event monitor to evaluate for any other potential supraventricular arrhythmias such as atrial fibrillation. If she were to demonstrate this, anticoagulation would be indicated given her multiple risk factors. I will obtain an echocardiogram to evaluate for structural heart disease as well. 2. Essential HTN: Controlled on present therapy. 3. Type 2 diabetes mellitus: On insulin, metformin and Janumet. 4. Tobacco abuse: Cessation counseling provided, although I do not feel she is motivated to quit.  Dispo: f/u 6-8 weeks.  Signed: Kate Sable, M.D., F.A.C.C.  11/08/2013, 8:50 AM

## 2013-11-08 NOTE — Patient Instructions (Addendum)
Your physician recommends that you schedule a follow-up appointment in: 6-8 weeks   Your physician recommends that you continue on your current medications as directed. Please refer to the Current Medication list given to you today.    Your physician has requested that you have an echocardiogram. Echocardiography is a painless test that uses sound waves to create images of your heart. It provides your doctor with information about the size and shape of your heart and how well your heart's chambers and valves are working. This procedure takes approximately one hour. There are no restrictions for this procedure.     Your physician has recommended that you wear an event monitor for 14 days. Event monitors are medical devices that record the heart's electrical activity. Doctors most often [place] these monitors to diagnose arrhythmias. Arrhythmias are problems with the speed or rhythm of the heartbeat. The monitor is a small, portable device. You can wear one while you do your normal daily activities. This is usually used to diagnose what is causing palpitations/syncope (passing out).     Thank you for choosing [facility] !

## 2013-11-13 ENCOUNTER — Ambulatory Visit (HOSPITAL_COMMUNITY)
Admission: RE | Admit: 2013-11-13 | Discharge: 2013-11-13 | Disposition: A | Discharge status: Home / Self Care | Payer: BC Managed Care – PPO | Source: Ambulatory Visit | Attending: Cardiovascular Disease | Admitting: Cardiovascular Disease

## 2013-11-13 DIAGNOSIS — E119 Type 2 diabetes mellitus without complications: Secondary | ICD-10-CM | POA: Insufficient documentation

## 2013-11-13 DIAGNOSIS — J449 Chronic obstructive pulmonary disease, unspecified: Secondary | ICD-10-CM | POA: Insufficient documentation

## 2013-11-13 DIAGNOSIS — I519 Heart disease, unspecified: Secondary | ICD-10-CM

## 2013-11-13 DIAGNOSIS — I079 Rheumatic tricuspid valve disease, unspecified: Secondary | ICD-10-CM | POA: Diagnosis not present

## 2013-11-13 DIAGNOSIS — R002 Palpitations: Secondary | ICD-10-CM

## 2013-11-13 DIAGNOSIS — I10 Essential (primary) hypertension: Secondary | ICD-10-CM | POA: Diagnosis not present

## 2013-11-13 DIAGNOSIS — I059 Rheumatic mitral valve disease, unspecified: Secondary | ICD-10-CM | POA: Insufficient documentation

## 2013-11-13 DIAGNOSIS — F17200 Nicotine dependence, unspecified, uncomplicated: Secondary | ICD-10-CM | POA: Insufficient documentation

## 2013-11-13 DIAGNOSIS — J4489 Other specified chronic obstructive pulmonary disease: Secondary | ICD-10-CM | POA: Insufficient documentation

## 2013-11-13 NOTE — Progress Notes (Signed)
  Echocardiogram 2D Echocardiogram has been performed.  [place], [place] 11/13/2013, 2:35 PM

## 2013-11-15 ENCOUNTER — Telehealth: Payer: Self-pay | Admitting: *Deleted

## 2013-11-15 NOTE — Telephone Encounter (Signed)
Pt monitor is not working it is saying something is wrong with it and she can not get in touch with company, she may have wrong number/tmj

## 2013-11-15 NOTE — Telephone Encounter (Signed)
Called pt and monitor is working correctly now. Confirmed phone number for technical problems.

## 2013-11-22 ENCOUNTER — Telehealth: Payer: Self-pay | Admitting: *Deleted

## 2013-11-22 NOTE — Telephone Encounter (Signed)
Received EOS report, in Dr. Raylene Everts office

## 2013-12-04 ENCOUNTER — Telehealth: Payer: Self-pay

## 2013-12-04 ENCOUNTER — Ambulatory Visit: Payer: BC Managed Care – PPO | Admitting: Cardiovascular Disease

## 2013-12-04 ENCOUNTER — Other Ambulatory Visit: Payer: Self-pay | Admitting: *Deleted

## 2013-12-04 DIAGNOSIS — R002 Palpitations: Secondary | ICD-10-CM

## 2013-12-04 MED ORDER — METOPROLOL TARTRATE 50 MG PO TABS: 75.0000 mg | ORAL_TABLET | Freq: Two times a day (BID) | ORAL | Status: DC

## 2013-12-04 NOTE — Telephone Encounter (Signed)
Message copied by Bernita Raisin on Mon Dec 04, 2013 10:19 AM ------      Message from: Kate Sable A      Created: Mon Dec 04, 2013 10:10 AM       Inappropriate sinus tachycardia. Increase metoprolol to 75 mg bid. ------

## 2013-12-04 NOTE — Telephone Encounter (Signed)
Rx e-scribed to [facility] made aware

## 2013-12-20 ENCOUNTER — Encounter: Payer: Self-pay | Admitting: Cardiovascular Disease

## 2013-12-20 ENCOUNTER — Ambulatory Visit (INDEPENDENT_AMBULATORY_CARE_PROVIDER_SITE_OTHER): Payer: Medicaid Other | Admitting: Cardiovascular Disease

## 2013-12-20 VITALS — BP 100/70 | HR 97 | Ht 66.0 in | Wt 237.0 lb

## 2013-12-20 DIAGNOSIS — R000 Tachycardia, unspecified: Secondary | ICD-10-CM

## 2013-12-20 DIAGNOSIS — Z716 Tobacco abuse counseling: Secondary | ICD-10-CM

## 2013-12-20 DIAGNOSIS — R002 Palpitations: Secondary | ICD-10-CM

## 2013-12-20 DIAGNOSIS — I471 Supraventricular tachycardia: Secondary | ICD-10-CM

## 2013-12-20 DIAGNOSIS — E1165 Type 2 diabetes mellitus with hyperglycemia: Secondary | ICD-10-CM

## 2013-12-20 DIAGNOSIS — I10 Essential (primary) hypertension: Secondary | ICD-10-CM

## 2013-12-20 MED ORDER — METOPROLOL TARTRATE 100 MG PO TABS: 100.0000 mg | ORAL_TABLET | Freq: Two times a day (BID) | ORAL | Status: DC

## 2013-12-20 MED ORDER — LISINOPRIL-HYDROCHLOROTHIAZIDE 10-12.5 MG PO TABS: 1.0000 | ORAL_TABLET | Freq: Every day | ORAL | Status: DC

## 2013-12-20 NOTE — Progress Notes (Signed)
[name], female   DOB: April 24, 1950, 63 y.o.   MRN: [id number]      SUBJECTIVE: The patient returns for follow-up after undergoing cardiovascular testing for palpitations and tachycardia. Echocardiography demonstrated normal left ventricular systolic function, EF 61-44%, with grade 1 diastolic dysfunction and trivial valvular regurgitation. Event monitoring demonstrated an inappropriate sinus tachycardia, for which I increased metoprolol to 75 mg twice daily. Her resting HR is 97 bpm today. She very seldom gets palpitations or shortness of breath. She denies chest pain. She has noticed SBP in high 90 mmHg range, without associated dizziness/lightheadedness. She has leg swelling and had been taking Lasix 40 mg, but has reduced it to 20 mg.   Review of Systems: As per "subjective", otherwise negative.  Allergies  Allergen Reactions  . Tetracyclines & Related Anaphylaxis  . Banana Hives and Nausea And Vomiting  . Penicillins Rash    Current Outpatient Prescriptions  Medication Sig Dispense Refill  . albuterol (PROVENTIL HFA;VENTOLIN HFA) 108 (90 BASE) MCG/ACT inhaler Inhale 2 puffs into the lungs every 6 (six) hours as needed for wheezing or shortness of breath.    Marland Kitchen aspirin EC 81 MG tablet Take 81 mg by mouth at bedtime.     Marland Kitchen BAYER CONTOUR TEST test strip     . Bilberry, Vaccinium myrtillus, 100 MG CAPS Take 1 capsule by mouth 2 (two) times daily.    . Cholecalciferol (VITAMIN D) 1000 UNITS capsule Take 1,000 Units by mouth 2 (two) times daily.     . Cyanocobalamin (VITAMIN B-12 PO) Take 1 tablet by mouth daily.      . cyclobenzaprine (FLEXERIL) 10 MG tablet Take 10 mg by mouth 3 (three) times daily as needed for muscle spasms.    . furosemide (LASIX) 40 MG tablet Take 20 mg by mouth 2 (two) times daily.   0  . HYDROcodone-acetaminophen (NORCO/VICODIN) 5-325 MG per tablet Take 1 tablet by mouth every 4 (four) hours as needed for moderate pain. 30 tablet 0  . insulin aspart  (NOVOLOG FLEXPEN) 100 UNIT/ML FlexPen Inject 8 Units into the skin 3 (three) times daily with meals. Add 2 units if 300 or above.    . Insulin Detemir (LEVEMIR FLEXPEN) 100 UNIT/ML Pen Inject 60 Units into the skin 2 (two) times daily.     Marland Kitchen levothyroxine (SYNTHROID, LEVOTHROID) 100 MCG tablet Take 100 mcg by mouth daily before breakfast.   0  . lisinopril-hydrochlorothiazide (PRINZIDE,ZESTORETIC) 20-12.5 MG per tablet Take 1 tablet by mouth daily.    . Melatonin 3 MG TABS Take 3 mg by mouth at bedtime as needed (insomnia).    . metFORMIN (GLUCOPHAGE) 1000 MG tablet     . metoprolol (LOPRESSOR) 50 MG tablet Take 1.5 tablets (75 mg total) by mouth 2 (two) times daily. 180 tablet 3  . naproxen (NAPROSYN) 500 MG tablet Take 500 mg by mouth 2 (two) times daily with a meal.    . pantoprazole (PROTONIX) 20 MG tablet Take 20 mg by mouth daily.    . pregabalin (LYRICA) 75 MG capsule Take 75 mg by mouth 2 (two) times daily.    . sitaGLIPtin-metformin (JANUMET) 50-1000 MG per tablet Take 1 tablet by mouth 2 (two) times daily with a meal.    . vitamin C (ASCORBIC ACID) 500 MG tablet Take 1,000 mg by mouth daily.    . vitamin E 400 UNIT capsule Take 400 Units by mouth daily.     No current facility-administered medications for this visit.  Past Medical History  Diagnosis Date  . Diabetes mellitus   . Hypertension   . Fibromyalgia   . Asthma   . PONV (postoperative nausea and vomiting)   . Hyperthyroidism   . Anemia   . H/O hiatal hernia   . GERD (gastroesophageal reflux disease)   . COPD (chronic obstructive pulmonary disease)     Past Surgical History  Procedure Laterality Date  . Cholecystectomy    . Abdominal hysterectomy  partial  . Carpal tunnel release Right 1991  . Wrist ganglion excision Left   . Cataract extraction w/phaco Right 05/08/2013    Procedure: CATARACT EXTRACTION PHACO AND INTRAOCULAR LENS PLACEMENT (IOC);  Surgeon: Tonny Branch, MD;  Location: [facility];  Service:  Ophthalmology;  Laterality: Right;  CDE 10.31  . Cataract extraction w/phaco Left 08/17/2013    Procedure: CATARACT EXTRACTION PHACO AND INTRAOCULAR LENS PLACEMENT (IOC);  Surgeon: Tonny Branch, MD;  Location: [facility];  Service: Ophthalmology;  Laterality: Left;  CDE:9.03    History   Social History  . Marital Status: Legally Separated    Spouse Name: N/A    Number of Children: N/A  . Years of Education: N/A   Occupational History  . Not on file.   Social History Main Topics  . Smoking status: Current Some Day Smoker -- 0.25 packs/day for 30 years    Types: Cigarettes  . Smokeless tobacco: Never Used     Comment: some day smoker  . Alcohol Use: No  . Drug Use: No  . Sexual Activity: Yes    Birth Control/ Protection: Surgical   Other Topics Concern  . Not on file   Social History Narrative     Filed Vitals:   12/20/13 1151  BP: 100/70  Pulse: 97  Height: 5' 6"  (1.676 m)  Weight: 237 lb (107.502 kg)    PHYSICAL EXAM General: NAD Neck: No JVD, no thyromegaly or thyroid nodule.  Lungs: Clear to auscultation bilaterally with normal respiratory effort. CV: Nondisplaced PMI. HR at upper normal limits, regular rhythm, normal S1/S2, no S3/S4, no murmur. Trace periankle edema b/l. No carotid bruit. Normal pedal pulses.  Abdomen: Soft, nontender, no hepatosplenomegaly, no distention.  Skin: Intact without lesions or rashes.  Neurologic: Alert and oriented x 3.  Psych: Normal affect. Extremities: No clubbing or cyanosis.  HEENT: Normal.   ECG: Most recent ECG reviewed.    ASSESSMENT AND PLAN: 1. Palpitations/inappropriate sinus tachycardia: Will increase metoprolol to 100 mg bid. To allow for this increase, will decrease Prinzide to 10-12.5 mg daily. I have asked her to monitor BP/HR at home and to inform me of these values. 2. Essential HTN: Controlled on present therapy. To allow for increase in metoprolol, will decrease Prinzide to 10-12.5 mg daily.  I have asked  her to monitor BP/HR at home and to inform me of these values. 3. Type 2 diabetes mellitus: On insulin, metformin and Janumet. 4. Tobacco abuse: Cessation counseling previously provided, although I do not feel she is motivated to quit.  Dispo: f/u 6 months.   Kate Sable, M.D., F.A.C.C.

## 2013-12-20 NOTE — Patient Instructions (Signed)
Your physician wants you to follow-up in: 6 months with Dr. Virgina Jock will receive a reminder letter in the mail two months in advance. If you don't receive a letter, please call our office to schedule the follow-up appointment.  Your physician has recommended you make the following change in your medication:   METOPROLOL 100 MG 2 TIMES DAILY  PRINZIDE 10-12.5 MG DAILY  PLEASE MONITOR YOUR BLOOD PRESSURE AND HEART RATE FOR 1 WEEK AND CALL us WITH THOSE  Thank you for choosing [facility]!!

## 2013-12-20 NOTE — Addendum Note (Signed)
Addended by: Hilarie Fredrickson T on: 12/20/2013 12:20 PM   Modules accepted: Orders, Medications, Level of Service

## 2013-12-27 ENCOUNTER — Other Ambulatory Visit: Payer: Self-pay | Admitting: *Deleted

## 2013-12-27 ENCOUNTER — Telehealth: Payer: Self-pay | Admitting: Cardiovascular Disease

## 2013-12-27 MED ORDER — METOPROLOL TARTRATE 100 MG PO TABS: ORAL_TABLET | ORAL | Status: DC

## 2013-12-27 MED ORDER — METOPROLOL TARTRATE 25 MG PO TABS: 25.0000 mg | ORAL_TABLET | Freq: Two times a day (BID) | ORAL | Status: DC

## 2013-12-27 NOTE — Addendum Note (Signed)
Addended by: Julian Hy T on: 12/27/2013 03:55 PM   Modules accepted: Orders, Medications

## 2013-12-27 NOTE — Telephone Encounter (Signed)
12/21/13 at 9am 129/69  HR 92 11/6 at 12:30pm 119/80  HR 88 11/7 at 11pm  134/79  HR 104 11/8 at 8:30am 144/86  HR 100 11/9 at 11am  122/77  HR 104 11/10 at 12 noon 155/74  HR 94 11/11 at 2pm  128/84  HR 97

## 2013-12-27 NOTE — Telephone Encounter (Signed)
Pt understood to take metoprolol 125 mg twice daily. Updated medication list

## 2013-12-27 NOTE — Telephone Encounter (Signed)
Will forward to Dr. Koneswaran  

## 2013-12-27 NOTE — Telephone Encounter (Signed)
Increase metoprolol to 125 mg bid.

## 2014-02-10 ENCOUNTER — Emergency Department (HOSPITAL_COMMUNITY)
Admission: EM | Admit: 2014-02-10 | Discharge: 2014-02-10 | Disposition: A | Discharge status: Other Acute Inpatient Hospital | Payer: Medicaid Other | Attending: Emergency Medicine | Admitting: Emergency Medicine

## 2014-02-10 ENCOUNTER — Encounter (HOSPITAL_COMMUNITY): Payer: Self-pay | Admitting: Emergency Medicine

## 2014-02-10 ENCOUNTER — Emergency Department (HOSPITAL_COMMUNITY): Payer: Medicaid Other

## 2014-02-10 DIAGNOSIS — D649 Anemia, unspecified: Secondary | ICD-10-CM | POA: Diagnosis not present

## 2014-02-10 DIAGNOSIS — R0602 Shortness of breath: Secondary | ICD-10-CM | POA: Diagnosis present

## 2014-02-10 DIAGNOSIS — Z880 Allergy status to penicillin: Secondary | ICD-10-CM | POA: Insufficient documentation

## 2014-02-10 DIAGNOSIS — J441 Chronic obstructive pulmonary disease with (acute) exacerbation: Secondary | ICD-10-CM | POA: Insufficient documentation

## 2014-02-10 DIAGNOSIS — K122 Cellulitis and abscess of mouth: Secondary | ICD-10-CM

## 2014-02-10 DIAGNOSIS — Z720 Tobacco use: Secondary | ICD-10-CM | POA: Insufficient documentation

## 2014-02-10 DIAGNOSIS — Z7982 Long term (current) use of aspirin: Secondary | ICD-10-CM | POA: Diagnosis not present

## 2014-02-10 DIAGNOSIS — Z791 Long term (current) use of non-steroidal anti-inflammatories (NSAID): Secondary | ICD-10-CM | POA: Diagnosis not present

## 2014-02-10 DIAGNOSIS — M797 Fibromyalgia: Secondary | ICD-10-CM | POA: Insufficient documentation

## 2014-02-10 DIAGNOSIS — K219 Gastro-esophageal reflux disease without esophagitis: Secondary | ICD-10-CM | POA: Insufficient documentation

## 2014-02-10 DIAGNOSIS — Z79899 Other long term (current) drug therapy: Secondary | ICD-10-CM | POA: Diagnosis not present

## 2014-02-10 DIAGNOSIS — Z794 Long term (current) use of insulin: Secondary | ICD-10-CM | POA: Insufficient documentation

## 2014-02-10 DIAGNOSIS — D72829 Elevated white blood cell count, unspecified: Secondary | ICD-10-CM | POA: Insufficient documentation

## 2014-02-10 DIAGNOSIS — I10 Essential (primary) hypertension: Secondary | ICD-10-CM | POA: Diagnosis not present

## 2014-02-10 DIAGNOSIS — E119 Type 2 diabetes mellitus without complications: Secondary | ICD-10-CM | POA: Insufficient documentation

## 2014-02-10 DIAGNOSIS — R221 Localized swelling, mass and lump, neck: Secondary | ICD-10-CM | POA: Diagnosis not present

## 2014-02-10 DIAGNOSIS — E0590 Thyrotoxicosis, unspecified without thyrotoxic crisis or storm: Secondary | ICD-10-CM | POA: Diagnosis not present

## 2014-02-10 DIAGNOSIS — A419 Sepsis, unspecified organism: Secondary | ICD-10-CM | POA: Insufficient documentation

## 2014-02-10 DIAGNOSIS — R61 Generalized hyperhidrosis: Secondary | ICD-10-CM | POA: Diagnosis not present

## 2014-02-10 LAB — I-STAT CG4 LACTIC ACID, ED
LACTIC ACID, VENOUS: 3.91 mmol/L — AB (ref 0.5–2.2)
LACTIC ACID, VENOUS: 1.69 mmol/L (ref 0.5–2.2)

## 2014-02-10 LAB — BASIC METABOLIC PANEL
ANION GAP: 11 (ref 5–15)
BUN: 16 mg/dL (ref 6–23)
CALCIUM: 10 mg/dL (ref 8.4–10.5)
CO2: 20 mmol/L (ref 19–32)
Chloride: 102 mEq/L (ref 96–112)
Creatinine, Ser: 1.15 mg/dL — ABNORMAL HIGH (ref 0.50–1.10)
GFR, EST AFRICAN AMERICAN: 57 mL/min — AB (ref >90)
GFR, EST NON AFRICAN AMERICAN: 50 mL/min — AB (ref >90)
Glucose, Bld: 417 mg/dL — ABNORMAL HIGH (ref 70–99)
Potassium: 4.6 mmol/L (ref 3.5–5.1)
SODIUM: 133 mmol/L — AB (ref 135–145)

## 2014-02-10 LAB — URINALYSIS, ROUTINE W REFLEX MICROSCOPIC
BILIRUBIN URINE: NEGATIVE
Glucose, UA: >1000 mg/dL — AB
Hgb urine dipstick: NEGATIVE
LEUKOCYTES UA: NEGATIVE
NITRITE: NEGATIVE
PROTEIN: NEGATIVE mg/dL
Specific Gravity, Urine: 1.01 (ref 1.005–1.030)
UROBILINOGEN UA: 0.2 mg/dL (ref 0.0–1.0)
pH: 5.5 (ref 5.0–8.0)

## 2014-02-10 LAB — CBC WITH DIFFERENTIAL/PLATELET
BASOS ABS: 0 10*3/uL (ref 0.0–0.1)
Basophils Relative: 0 % (ref 0–1)
EOS ABS: 0.1 10*3/uL (ref 0.0–0.7)
EOS PCT: 0 % (ref 0–5)
HCT: 34.2 % — ABNORMAL LOW (ref 36.0–46.0)
Hemoglobin: 11.3 g/dL — ABNORMAL LOW (ref 12.0–15.0)
LYMPHS ABS: 2.6 10*3/uL (ref 0.7–4.0)
Lymphocytes Relative: 13 % (ref 12–46)
MCH: 27.6 pg (ref 26.0–34.0)
MCHC: 33 g/dL (ref 30.0–36.0)
MCV: 83.6 fL (ref 78.0–100.0)
Monocytes Absolute: 1.1 10*3/uL — ABNORMAL HIGH (ref 0.1–1.0)
Monocytes Relative: 5 % (ref 3–12)
Neutro Abs: 16.4 10*3/uL — ABNORMAL HIGH (ref 1.7–7.7)
Neutrophils Relative %: 82 % — ABNORMAL HIGH (ref 43–77)
PLATELETS: 277 10*3/uL (ref 150–400)
RBC: 4.09 MIL/uL (ref 3.87–5.11)
RDW: 14.5 % (ref 11.5–15.5)
WBC: 20.2 10*3/uL — AB (ref 4.0–10.5)

## 2014-02-10 LAB — URINE MICROSCOPIC-ADD ON

## 2014-02-10 MED ORDER — HYDROMORPHONE HCL 1 MG/ML IJ SOLN
1.0000 mg | Freq: Once | INTRAMUSCULAR | Status: AC
  Administered 2014-02-10: 1 mg via INTRAVENOUS
  Filled 2014-02-10: qty 1

## 2014-02-10 MED ORDER — SODIUM CHLORIDE 0.9 % IV BOLUS (SEPSIS)
1000.0000 mL | Freq: Once | INTRAVENOUS | Status: AC
  Administered 2014-02-10: 1000 mL via INTRAVENOUS

## 2014-02-10 MED ORDER — ONDANSETRON HCL 4 MG/2ML IJ SOLN
4.0000 mg | Freq: Once | INTRAMUSCULAR | Status: AC
  Administered 2014-02-10: 4 mg via INTRAVENOUS

## 2014-02-10 MED ORDER — ONDANSETRON HCL 4 MG/2ML IJ SOLN
4.0000 mg | Freq: Once | INTRAMUSCULAR | Status: AC
  Administered 2014-02-10: 4 mg via INTRAMUSCULAR
  Filled 2014-02-10: qty 2

## 2014-02-10 MED ORDER — SODIUM CHLORIDE 0.9 % IV BOLUS (SEPSIS)
500.0000 mL | INTRAVENOUS | Status: AC
  Administered 2014-02-10: 500 mL via INTRAVENOUS

## 2014-02-10 MED ORDER — FENTANYL CITRATE 0.05 MG/ML IJ SOLN
INTRAMUSCULAR | Status: AC
  Administered 2014-02-10: 50 ug
  Filled 2014-02-10: qty 2

## 2014-02-10 MED ORDER — SODIUM CHLORIDE 0.9 % IV BOLUS (SEPSIS)
1000.0000 mL | INTRAVENOUS | Status: AC
  Administered 2014-02-10 (×2): 1000 mL via INTRAVENOUS

## 2014-02-10 MED ORDER — CLINDAMYCIN PHOSPHATE 600 MG/50ML IV SOLN
600.0000 mg | Freq: Once | INTRAVENOUS | Status: AC
  Administered 2014-02-10: 600 mg via INTRAVENOUS
  Filled 2014-02-10: qty 50

## 2014-02-10 MED ORDER — SODIUM CHLORIDE 0.9 % IV BOLUS (SEPSIS)
500.0000 mL | Freq: Once | INTRAVENOUS | Status: AC
  Administered 2014-02-10: 500 mL via INTRAVENOUS

## 2014-02-10 MED ORDER — IOHEXOL 300 MG/ML  SOLN
75.0000 mL | Freq: Once | INTRAMUSCULAR | Status: AC | PRN
  Administered 2014-02-10: 75 mL via INTRAVENOUS

## 2014-02-10 MED ORDER — ONDANSETRON HCL 4 MG/2ML IJ SOLN
4.0000 mg | Freq: Once | INTRAMUSCULAR | Status: DC
  Filled 2014-02-10: qty 2

## 2014-02-10 NOTE — ED Provider Notes (Signed)
CSN: [id number]     Arrival date & time 02/10/14  1100 History  This chart was scribed for Richarda Blade, MD by Stephania Fragmin, ED Scribe. This patient was seen in room APA19/APA19 and the patient's care was started at 12:45 PM.    Chief Complaint  Patient presents with  . Shortness of Breath   The history is provided by the patient. No language interpreter was used.     HPI Comments: Stephanie [name] is a 63 y.o. female who presents to the Emergency Department complaining of constant, worsening, mouth pain that began 3 days ago. Patient woke up diaphoretic. About 2 weeks ago, patient had dental surgery done by Dr. Hardie Shackleton to remove all her teeth except her bottom front teeth. She has taken Tylenol. Patient reports a history of DM, fibromyalgia, hiatal hernia, and chronic tachycardia (unknown causes). She has allergies to Tetracycline and Penicillin.  Past Medical History  Diagnosis Date  . Diabetes mellitus   . Hypertension   . Fibromyalgia   . Asthma   . PONV (postoperative nausea and vomiting)   . Hyperthyroidism   . Anemia   . H/O hiatal hernia   . GERD (gastroesophageal reflux disease)   . COPD (chronic obstructive pulmonary disease)    Past Surgical History  Procedure Laterality Date  . Cholecystectomy    . Abdominal hysterectomy  partial  . Carpal tunnel release Right 1991  . Wrist ganglion excision Left   . Cataract extraction w/phaco Right 05/08/2013    Procedure: CATARACT EXTRACTION PHACO AND INTRAOCULAR LENS PLACEMENT (IOC);  Surgeon: Tonny Branch, MD;  Location: [facility];  Service: Ophthalmology;  Laterality: Right;  CDE 10.31  . Cataract extraction w/phaco Left 08/17/2013    Procedure: CATARACT EXTRACTION PHACO AND INTRAOCULAR LENS PLACEMENT (IOC);  Surgeon: Tonny Branch, MD;  Location: [facility];  Service: Ophthalmology;  Laterality: Left;  CDE:9.03  . Dental surgery     No family history on file. History  Substance Use Topics  . Smoking status: Current Some Day Smoker -- 0.25  packs/day for 30 years    Types: Cigarettes  . Smokeless tobacco: Never Used     Comment: some day smoker  . Alcohol Use: No   OB History    No data available     Review of Systems  Constitutional: Positive for diaphoresis.  HENT:       Mouth pain  Respiratory: Positive for shortness of breath.   All other systems reviewed and are negative.     Allergies  Tetracyclines & related; Banana; and Penicillins  Home Medications   Prior to Admission medications   Medication Sig Start Date End Date Taking? Authorizing Provider  albuterol (PROVENTIL HFA;VENTOLIN HFA) 108 (90 BASE) MCG/ACT inhaler Inhale 2 puffs into the lungs every 6 (six) hours as needed for wheezing or shortness of breath.   Yes Historical Provider, MD  aspirin EC 81 MG tablet Take 81 mg by mouth at bedtime.    Yes Historical Provider, MD  Bilberry, Vaccinium myrtillus, 100 MG CAPS Take 1 capsule by mouth 2 (two) times daily.   Yes Historical Provider, MD  Cholecalciferol (VITAMIN D) 1000 UNITS capsule Take 1,000 Units by mouth 2 (two) times daily.    Yes Historical Provider, MD  Cyanocobalamin (VITAMIN B-12 PO) Take 1 tablet by mouth daily.     Yes Historical Provider, MD  cyclobenzaprine (FLEXERIL) 10 MG tablet Take 10 mg by mouth 3 (three) times daily as needed for muscle spasms.  Yes Historical Provider, MD  furosemide (LASIX) 40 MG tablet Take 20 mg by mouth 2 (two) times daily.  11/22/13  Yes Historical Provider, MD  insulin aspart (NOVOLOG FLEXPEN) 100 UNIT/ML FlexPen Inject 22 Units into the skin 3 (three) times daily with meals. Add 3 units if 300 or above.   Yes Historical Provider, MD  Insulin Detemir (LEVEMIR FLEXPEN) 100 UNIT/ML Pen Inject 60 Units into the skin 2 (two) times daily.    Yes Historical Provider, MD  levothyroxine (SYNTHROID, LEVOTHROID) 100 MCG tablet Take 100 mcg by mouth daily before breakfast.  11/23/13  Yes Historical Provider, MD  lisinopril-hydrochlorothiazide (PRINZIDE) 10-12.5 MG per  tablet Take 1 tablet by mouth daily. 12/20/13  Yes Herminio Commons, MD  Melatonin 3 MG TABS Take 6 mg by mouth at bedtime as needed (insomnia).    Yes Historical Provider, MD  metoprolol (LOPRESSOR) 100 MG tablet Take 125 mg twice daily Patient taking differently: Take 100 mg by mouth 2 (two) times daily. Take with 25 mg tablet for total dosage of 125mg . 12/27/13  Yes Herminio Commons, MD  metoprolol tartrate (LOPRESSOR) 25 MG tablet Take 1 tablet (25 mg total) by mouth 2 (two) times daily. Patient taking differently: Take 25 mg by mouth 2 (two) times daily. Take with 100 mg tablet for total dosage of 125 mg. 12/27/13  Yes Herminio Commons, MD  naproxen (NAPROSYN) 500 MG tablet Take 500 mg by mouth 2 (two) times daily with a meal.   Yes Historical Provider, MD  pantoprazole (PROTONIX) 20 MG tablet Take 20 mg by mouth daily.   Yes Historical Provider, MD  pregabalin (LYRICA) 75 MG capsule Take 75 mg by mouth 2 (two) times daily.   Yes Historical Provider, MD  sitaGLIPtin-metformin (JANUMET) 50-1000 MG per tablet Take 1 tablet by mouth 2 (two) times daily with a meal.   Yes Historical Provider, MD  vitamin C (ASCORBIC ACID) 500 MG tablet Take 1,000 mg by mouth daily.   Yes Historical Provider, MD  vitamin E 400 UNIT capsule Take 400 Units by mouth daily.   Yes Historical Provider, MD  BAYER CONTOUR TEST test strip  11/06/13   Historical Provider, MD  HYDROcodone-acetaminophen (NORCO/VICODIN) 5-325 MG per tablet Take 1 tablet by mouth every 4 (four) hours as needed for moderate pain. Patient not taking: Reported on 02/10/2014 10/22/13   Samuella Cota, MD   BP 136/122 mmHg  Pulse 137  Temp(Src) 98.4 F (36.9 C)  Resp 31  Wt 222 lb 6 oz (100.869 kg)  SpO2 97% Physical Exam  Constitutional: She is oriented to person, place, and time. She appears well-developed and well-nourished. She appears distressed.  HENT:  Head: Normocephalic and atraumatic.  Moderate swelling, submental, more  right than left.  Without associated trismus.  Mild erythema over the swollen area.  No distinct palpable fluctuance or abscess.  No associated lymphadenopathy.  Eyes: Conjunctivae and EOM are normal.  Neck: Normal range of motion and phonation normal. Neck supple.  Cardiovascular: Regular rhythm.   Pulmonary/Chest: No respiratory distress. She has no wheezes.  Abdominal: Soft. She exhibits no distension. There is no tenderness. There is no guarding.  Musculoskeletal: Normal range of motion.  Neurological: She is alert and oriented to person, place, and time. She exhibits normal muscle tone.  Skin: Skin is warm and dry.  Psychiatric: She has a normal mood and affect. Her behavior is normal. Judgment and thought content normal.  Nursing note and vitals reviewed.  ED Course  Procedures (including critical care time)  DIAGNOSTIC STUDIES: Oxygen Saturation is 100% on room air, normal by my interpretation.    COORDINATION OF CARE: 12:46 PM - Discussed treatment plan with pt at bedside which includes IV medication and blood labs and pt agreed to plan.   Medications  ondansetron (ZOFRAN) injection 4 mg (not administered)  sodium chloride 0.9 % bolus 1,000 mL (1,000 mLs Intravenous New Bag/Given 02/10/14 1416)    Followed by  sodium chloride 0.9 % bolus 500 mL (not administered)  sodium chloride 0.9 % bolus 500 mL (0 mLs Intravenous Stopped 02/10/14 1327)  ondansetron (ZOFRAN) injection 4 mg (4 mg Intravenous Given 02/10/14 1303)  HYDROmorphone (DILAUDID) injection 1 mg (1 mg Intravenous Given 02/10/14 1303)  clindamycin (CLEOCIN) IVPB 600 mg (0 mg Intravenous Stopped 02/10/14 1335)    Patient Vitals for the past 24 hrs:  BP Temp Pulse Resp SpO2 Weight  02/10/14 1330 (!) 136/122 mmHg - (!) 137 (!) 31 97 % -  02/10/14 1300 - - - - - 222 lb 6 oz (100.869 kg)  02/10/14 1233 159/82 mmHg - (!) 135 (!) 31 100 % -  02/10/14 1232 159/82 mmHg - (!) 135 24 100 % -  02/10/14 1230 161/100 mmHg -  (!) 138 19 100 % -  02/10/14 1200 151/95 mmHg - 119 14 100 % -  02/10/14 1130 147/89 mmHg - (!) 124 17 100 % -  02/10/14 1107 161/88 mmHg 98.4 F (36.9 C) (!) 137 25 100 % 237 lb (107.502 kg)     3:58 PM Reevaluation with update and discussion. After initial assessment and treatment, an updated evaluation reveals she is much more comfortable.  Her pain has improved.  She is awaiting CT imaging to evaluate for drainable abscess, or airway compromize. Harleigh Civello L     Labs Review Labs Reviewed  CBC WITH DIFFERENTIAL - Abnormal; Notable for the following:    WBC 20.2 (*)    Hemoglobin 11.3 (*)    HCT 34.2 (*)    Neutrophils Relative % 82 (*)    Neutro Abs 16.4 (*)    Monocytes Absolute 1.1 (*)    All other components within normal limits  BASIC METABOLIC PANEL - Abnormal; Notable for the following:    Sodium 133 (*)    Glucose, Bld 417 (*)    Creatinine, Ser 1.15 (*)    GFR calc non Af Amer 50 (*)    GFR calc Af Amer 57 (*)    All other components within normal limits  URINALYSIS, ROUTINE W REFLEX MICROSCOPIC - Abnormal; Notable for the following:    Glucose, UA >1000 (*)    Ketones, ur TRACE (*)    All other components within normal limits  I-STAT CG4 LACTIC ACID, ED - Abnormal; Notable for the following:    Lactic Acid, Venous 3.91 (*)    All other components within normal limits  CULTURE, BLOOD (ROUTINE X 2)  CULTURE, BLOOD (ROUTINE X 2)  URINE CULTURE  URINE MICROSCOPIC-ADD ON  I-STAT CG4 LACTIC ACID, ED    Imaging Review Dg Chest Port 1 View  02/10/2014   CLINICAL DATA:  Shortness of breath with cough and congestion. Smoker.  EXAM: PORTABLE CHEST - 1 VIEW  COMPARISON:  08/22/2010  FINDINGS: Lungs are hypoinflated without consolidation or effusion. Cardiomediastinal silhouette is within normal. There are degenerative changes of the spine and shoulders.  IMPRESSION: No active disease.   Electronically Signed   By: Marin Olp M.D.  On: 02/10/2014 14:07     EKG  Interpretation   Date/Time:  Saturday February 10 2014 11:16:08 EST Ventricular Rate:  135 PR Interval:  97 QRS Duration: 72 QT Interval:  359 QTC Calculation: 538 R Axis:   15 Text Interpretation:  Sinus tachycardia Consider right atrial enlargement  Borderline T abnormalities, diffuse leads Prolonged QT interval Since last  tracing QT has lengthened and nonspecific T wave abnormality is new  Confirmed by Akylah Hascall  MD, Kaylianna Detert ([id number]) on 02/10/2014 11:28:02 AM      MDM   Final diagnoses:  Neck swelling   Submandibular and neck swelling, post oral surgery, and extractions.  Elevated lactate, and white blood cell count. She will likely require admission.  CT imaging will determine need for open drainage procedure.]  Nursing Notes Reviewed/ Care Coordinated, and agree without changes. Applicable Imaging Reviewed.  Interpretation of Laboratory Data incorporated into ED treatment  Disposition- as per oncoming provider team.  Case was discussed with Dr. Wyvonnia Dusky  at time of shift change.  I personally performed the services described in this documentation, which was scribed in my presence. The recorded information has been reviewed and is accurate.      Richarda Blade, MD 02/10/14 2013

## 2014-02-10 NOTE — ED Notes (Addendum)
Antibiotics hung following blood cultures.  Under jaw, area swollen, red and tender to touch.

## 2014-02-10 NOTE — ED Provider Notes (Signed)
Symptoms care from Dr. Eulis Foster at 4:30 PM. Patient with 3 day history of right-sided mouth pain and oral swelling after dental extraction 2 weeks ago. She is tachycardic. She has swelling to the right side of her jaw and anterior neck. CT scan pending. Airway patent at this time.  CT scan shows submandibular abscess. Airway is patent. Case discussed with patient's oral surgeon Dr. Hardie Shackleton. He requests admission for IV antibiotics. He only has privileges at [place] with hospitalist at [facility] Dr. Langston Masker. He accepts patient to stepdown unit. Requests repeat lactate.  Patient's airway is stable. She is persistently tachycardic but in no distress. She is tolerating her secretions.  Lactate improved to 1.7. HR persists in 130s.  No hypoxia. BP stable. Airway stable. D/w Dr. Sherral Hammers at [facility] that patient may need evaluation for PE but already received IV contrast.  CRITICAL CARE Performed by: Ezequiel Essex Total critical care time:30 Critical care time was exclusive of separately billable procedures and treating other patients. Critical care was necessary to treat or prevent imminent or life-threatening deterioration. Critical care was time spent personally by me on the following activities: development of treatment plan with patient and/or surrogate as well as nursing, discussions with consultants, evaluation of patient's response to treatment, examination of patient, obtaining history from patient or surrogate, ordering and performing treatments and interventions, ordering and review of laboratory studies, ordering and review of radiographic studies, pulse oximetry and re-evaluation of patient's condition.   Ezequiel Essex, MD 02/11/14 0111

## 2014-02-10 NOTE — ED Notes (Signed)
MD at bedside. 

## 2014-02-10 NOTE — ED Notes (Signed)
Pt recently had dental surgery c/o mouth pain, also SHOB that started today, states she thinks it from her asthma.

## 2014-02-12 LAB — URINE CULTURE

## 2014-02-12 LAB — CBG MONITORING, ED: GLUCOSE-CAPILLARY: 355 mg/dL — AB (ref 70–99)

## 2014-02-15 LAB — CULTURE, BLOOD (ROUTINE X 2)
CULTURE: NO GROWTH
CULTURE: NO GROWTH

## 2014-06-25 ENCOUNTER — Ambulatory Visit (INDEPENDENT_AMBULATORY_CARE_PROVIDER_SITE_OTHER): Payer: Medicare Other | Admitting: Adult Health

## 2014-06-25 ENCOUNTER — Encounter: Payer: Self-pay | Admitting: Adult Health

## 2014-06-25 VITALS — BP 118/72 | HR 95 | Ht 66.5 in | Wt 235.0 lb

## 2014-06-25 DIAGNOSIS — I10 Essential (primary) hypertension: Secondary | ICD-10-CM | POA: Diagnosis not present

## 2014-06-25 DIAGNOSIS — I471 Supraventricular tachycardia: Secondary | ICD-10-CM

## 2014-06-25 MED ORDER — METOPROLOL TARTRATE 25 MG PO TABS: 25.0000 mg | ORAL_TABLET | Freq: Two times a day (BID) | ORAL | Status: DC

## 2014-06-25 MED ORDER — METOPROLOL TARTRATE 100 MG PO TABS
100.0000 mg | ORAL_TABLET | Freq: Two times a day (BID) | ORAL | Status: DC
Start: 2014-06-25 — End: 2015-05-22

## 2014-06-25 MED ORDER — FUROSEMIDE 40 MG PO TABS: 20.0000 mg | ORAL_TABLET | Freq: Two times a day (BID) | ORAL | Status: DC

## 2014-06-25 NOTE — Progress Notes (Deleted)
Name: Stephanie [name]    DOB: 1950/07/16  Age: 64 y.o.  MR#: [id number]       PCP:  Arman Filter, FNP      Insurance: Payor: MEDICARE / Plan: MEDICARE PART A AND B / Product Type: *No Product type* /   CC:    Chief Complaint  Patient presents with  . Palpitations  . Tachycardia    VS Filed Vitals:   06/25/14 1531  BP: 118/72  Pulse: 95  Height: 5' 6.5" (1.689 m)  Weight: 235 lb (106.595 kg)  SpO2: 98%    Weights Current Weight  06/25/14 235 lb (106.595 kg)  02/10/14 222 lb 6 oz (100.869 kg)  12/20/13 237 lb (107.502 kg)    Blood Pressure  BP Readings from Last 3 Encounters:  06/25/14 118/72  02/10/14 136/73  12/20/13 100/70     Admit date:  (Not on file) Last encounter with RMR:  Visit date not found   Allergy Tetracyclines & related; Banana; and Penicillins  Current Outpatient Prescriptions  Medication Sig Dispense Refill  . albuterol (PROVENTIL HFA;VENTOLIN HFA) 108 (90 BASE) MCG/ACT inhaler Inhale 2 puffs into the lungs every 6 (six) hours as needed for wheezing or shortness of breath.    Marland Kitchen aspirin EC 81 MG tablet Take 81 mg by mouth at bedtime.     Marland Kitchen BAYER CONTOUR TEST test strip     . Bilberry, Vaccinium myrtillus, 100 MG CAPS Take 1 capsule by mouth 2 (two) times daily.    . Cholecalciferol (VITAMIN D) 1000 UNITS capsule Take 1,000 Units by mouth 2 (two) times daily.     . Cyanocobalamin (VITAMIN B-12 PO) Take 1 tablet by mouth daily.      . cyclobenzaprine (FLEXERIL) 10 MG tablet Take 10 mg by mouth 3 (three) times daily as needed for muscle spasms.    . furosemide (LASIX) 40 MG tablet Take 20 mg by mouth 2 (two) times daily.   0  . HYDROcodone-acetaminophen (NORCO/VICODIN) 5-325 MG per tablet Take 1 tablet by mouth every 4 (four) hours as needed for moderate pain. 30 tablet 0  . insulin aspart (NOVOLOG FLEXPEN) 100 UNIT/ML FlexPen Inject 22 Units into the skin 3 (three) times daily with meals. Add 3 units if 300 or above.    . Insulin Detemir (LEVEMIR  FLEXPEN) 100 UNIT/ML Pen Inject 60 Units into the skin 2 (two) times daily.     Marland Kitchen levothyroxine (SYNTHROID, LEVOTHROID) 100 MCG tablet Take 100 mcg by mouth daily before breakfast.   0  . lisinopril (PRINIVIL,ZESTRIL) 10 MG tablet Take 10 mg by mouth daily.    . Melatonin 3 MG TABS Take 6 mg by mouth at bedtime as needed (insomnia).     . metoprolol (LOPRESSOR) 100 MG tablet Take 125 mg twice daily (Patient taking differently: Take 100 mg by mouth 2 (two) times daily. Take with 25 mg tablet for total dosage of 125mg .) 180 tablet 3  . metoprolol tartrate (LOPRESSOR) 25 MG tablet Take 1 tablet (25 mg total) by mouth 2 (two) times daily. (Patient taking differently: Take 25 mg by mouth 2 (two) times daily. Take with 100 mg tablet for total dosage of 125 mg.) 180 tablet 3  . naproxen (NAPROSYN) 500 MG tablet Take 500 mg by mouth 2 (two) times daily with a meal.    . pantoprazole (PROTONIX) 20 MG tablet Take 20 mg by mouth daily.    . pregabalin (LYRICA) 75 MG capsule Take 75 mg by mouth  2 (two) times daily.    . sitaGLIPtin-metformin (JANUMET) 50-1000 MG per tablet Take 1 tablet by mouth 2 (two) times daily with a meal.    . vitamin C (ASCORBIC ACID) 500 MG tablet Take 1,000 mg by mouth daily.    . vitamin E 400 UNIT capsule Take 400 Units by mouth daily.     No current facility-administered medications for this visit.    Discontinued Meds:    Medications Discontinued During This Encounter  Medication Reason  . lisinopril-hydrochlorothiazide (PRINZIDE) 10-12.5 MG per tablet Error    Patient Active Problem List   Diagnosis Date Noted  . Diverticulitis 10/19/2013  . Tobacco abuse 10/19/2013  . Obesity 10/19/2013  . Diabetes 10/19/2013  . Sinus tachycardia 10/19/2013  . Leukocytosis 10/19/2013  . Hypertension   . Hyperthyroidism   . Anemia   . GERD (gastroesophageal reflux disease)   . COPD (chronic obstructive pulmonary disease)     LABS    Component Value Date/Time   NA 133*  02/10/2014 1257   NA 139 10/20/2013 0615   NA 137 10/19/2013 0648   K 4.6 02/10/2014 1257   K 3.8 10/20/2013 0615   K 4.3 10/19/2013 0648   CL 102 02/10/2014 1257   CL 104 10/20/2013 0615   CL 101 10/19/2013 0648   CO2 20 02/10/2014 1257   CO2 25 10/20/2013 0615   CO2 24 10/19/2013 0648   GLUCOSE 417* 02/10/2014 1257   GLUCOSE 158* 10/20/2013 0615   GLUCOSE 259* 10/19/2013 0648   BUN 16 02/10/2014 1257   BUN 9 10/20/2013 0615   BUN 12 10/19/2013 0648   CREATININE 1.15* 02/10/2014 1257   CREATININE 0.80 10/20/2013 0615   CREATININE 0.81 10/19/2013 1157   CALCIUM 10.0 02/10/2014 1257   CALCIUM 8.2* 10/20/2013 0615   CALCIUM 9.2 10/19/2013 0648   GFRNONAA 50* 02/10/2014 1257   GFRNONAA 77* 10/20/2013 0615   GFRNONAA 76* 10/19/2013 1157   GFRAA 57* 02/10/2014 1257   GFRAA 89* 10/20/2013 0615   GFRAA 88* 10/19/2013 1157   CMP     Component Value Date/Time   NA 133* 02/10/2014 1257   K 4.6 02/10/2014 1257   CL 102 02/10/2014 1257   CO2 20 02/10/2014 1257   GLUCOSE 417* 02/10/2014 1257   BUN 16 02/10/2014 1257   CREATININE 1.15* 02/10/2014 1257   CALCIUM 10.0 02/10/2014 1257   PROT 8.5* 10/19/2013 0648   ALBUMIN 3.4* 10/19/2013 0648   AST 20 10/19/2013 0648   ALT 30 10/19/2013 0648   ALKPHOS 177* 10/19/2013 0648   BILITOT 0.3 10/19/2013 0648   GFRNONAA 50* 02/10/2014 1257   GFRAA 57* 02/10/2014 1257       Component Value Date/Time   WBC 20.2* 02/10/2014 1257   WBC 9.1 10/20/2013 0615   WBC 13.0* 10/19/2013 1157   HGB 11.3* 02/10/2014 1257   HGB 10.3* 10/20/2013 0615   HGB 10.3* 10/19/2013 1157   HCT 34.2* 02/10/2014 1257   HCT 31.4* 10/20/2013 0615   HCT 31.5* 10/19/2013 1157   MCV 83.6 02/10/2014 1257   MCV 86.5 10/20/2013 0615   MCV 86.3 10/19/2013 1157    Lipid Panel  No results found for: CHOL, TRIG, HDL, CHOLHDL, VLDL, LDLCALC, LDLDIRECT  ABG No results found for: PHART, PCO2ART, PO2ART, HCO3, TCO2, ACIDBASEDEF, O2SAT   Lab Results  Component  Value Date   TSH 2.290 10/19/2013   BNP (last 3 results) No results for input(s): BNP in the last 8760 hours.  ProBNP (last  3 results) No results for input(s): PROBNP in the last 8760 hours.  Cardiac Panel (last 3 results) No results for input(s): CKTOTAL, CKMB, TROPONINI, RELINDX in the last 72 hours.  Iron/TIBC/Ferritin/ %Sat No results found for: IRON, TIBC, FERRITIN, IRONPCTSAT   EKG Orders placed or performed during the hospital encounter of 02/10/14  . EKG 12-Lead  . EKG 12-Lead  . EKG     Prior Assessment and Plan Problem List as of 06/25/2014      Cardiovascular and Mediastinum   Hypertension     Respiratory   COPD (chronic obstructive pulmonary disease)     Digestive   GERD (gastroesophageal reflux disease)     Endocrine   Hyperthyroidism   Diabetes     Other   Diverticulitis   Anemia   Tobacco abuse   Obesity   Sinus tachycardia   Leukocytosis       Imaging: No results found.

## 2014-06-25 NOTE — Patient Instructions (Signed)
Your physician wants you to follow-up in: 6 months with Stephanie Sims, NP.  You will receive a reminder letter in the mail two months in advance. If you don't receive a letter, please call our office to schedule the follow-up appointment.  Your physician recommends that you continue on your current medications as directed. Please refer to the Current Medication list given to you today.  Please have you PCP send [place] a copy of your labs.   Thank you for choosing [facility]!

## 2014-06-25 NOTE — Progress Notes (Signed)
Cardiology Office Note   Date:  06/25/2014   ID:  Stephanie [name], DOB 10-Aug-1950, MRN [id number]  PCP:  Stephanie Filter, FNP  Cardiologist:  Stephanie Chen, NP   Chief Complaint  Patient presents with  . Palpitations  . Tachycardia      History of Present Illness: Stephanie [name] is a 64 y.o. female who presents for ongoing assessment and management of tachycardia, with history of grade 1 diastolic dysfunction, and trivial valvular regurgitation.  The patient's metoprolol was increased to 100 mg twice a day.  When last seen by Dr. Bronson Tuckertobacco cessation counseling was had.  The patient is not motivated to quit.    Patient comes today with he complains of recurrent tachycardia, chest pain, she continues to have some mild lower extremity edema, for which she is taking Lasix.  She has noticed that she will need to take the Lasix, more often now, as she was taking it when necessary.she continues on metoprolol 125 mg daily.  She is due to see her primary care physician, Dr. Liane [name] in [place] on the 25th, for which labs are to be drawn.  She continues to have chronic pain, for which she takes naproxen sodium.    Past Medical History  Diagnosis Date  . Diabetes mellitus   . Hypertension   . Fibromyalgia   . Asthma   . PONV (postoperative nausea and vomiting)   . Hyperthyroidism   . Anemia   . H/O hiatal hernia   . GERD (gastroesophageal reflux disease)   . COPD (chronic obstructive pulmonary disease)     Past Surgical History  Procedure Laterality Date  . Cholecystectomy    . Abdominal hysterectomy  partial  . Carpal tunnel release Right 1991  . Wrist ganglion excision Left   . Cataract extraction w/phaco Right 05/08/2013    Procedure: CATARACT EXTRACTION PHACO AND INTRAOCULAR LENS PLACEMENT (IOC);  Surgeon: Tonny Branch, MD;  Location: [facility];  Service: Ophthalmology;  Laterality: Right;  CDE 10.31  . Cataract extraction w/phaco Left 08/17/2013    Procedure: CATARACT  EXTRACTION PHACO AND INTRAOCULAR LENS PLACEMENT (IOC);  Surgeon: Tonny Branch, MD;  Location: [facility];  Service: Ophthalmology;  Laterality: Left;  CDE:9.03  . Dental surgery       Current Outpatient Prescriptions  Medication Sig Dispense Refill  . albuterol (PROVENTIL HFA;VENTOLIN HFA) 108 (90 BASE) MCG/ACT inhaler Inhale 2 puffs into the lungs every 6 (six) hours as needed for wheezing or shortness of breath.    Marland Kitchen aspirin EC 81 MG tablet Take 81 mg by mouth at bedtime.     Marland Kitchen BAYER CONTOUR TEST test strip     . Bilberry, Vaccinium myrtillus, 100 MG CAPS Take 1 capsule by mouth 2 (two) times daily.    . Cholecalciferol (VITAMIN D) 1000 UNITS capsule Take 1,000 Units by mouth 2 (two) times daily.     . Cyanocobalamin (VITAMIN B-12 PO) Take 1 tablet by mouth daily.      . cyclobenzaprine (FLEXERIL) 10 MG tablet Take 10 mg by mouth 3 (three) times daily as needed for muscle spasms.    . furosemide (LASIX) 40 MG tablet Take 20 mg by mouth 2 (two) times daily.   0  . HYDROcodone-acetaminophen (NORCO/VICODIN) 5-325 MG per tablet Take 1 tablet by mouth every 4 (four) hours as needed for moderate pain. (Patient not taking: Reported on 02/10/2014) 30 tablet 0  . insulin aspart (NOVOLOG FLEXPEN) 100 UNIT/ML FlexPen Inject 22 Units into the skin  3 (three) times daily with meals. Add 3 units if 300 or above.    . Insulin Detemir (LEVEMIR FLEXPEN) 100 UNIT/ML Pen Inject 60 Units into the skin 2 (two) times daily.     Marland Kitchen levothyroxine (SYNTHROID, LEVOTHROID) 100 MCG tablet Take 100 mcg by mouth daily before breakfast.   0  . lisinopril-hydrochlorothiazide (PRINZIDE) 10-12.5 MG per tablet Take 1 tablet by mouth daily. 30 tablet 3  . Melatonin 3 MG TABS Take 6 mg by mouth at bedtime as needed (insomnia).     . metoprolol (LOPRESSOR) 100 MG tablet Take 125 mg twice daily (Patient taking differently: Take 100 mg by mouth 2 (two) times daily. Take with 25 mg tablet for total dosage of 125mg .) 180 tablet 3  .  metoprolol tartrate (LOPRESSOR) 25 MG tablet Take 1 tablet (25 mg total) by mouth 2 (two) times daily. (Patient taking differently: Take 25 mg by mouth 2 (two) times daily. Take with 100 mg tablet for total dosage of 125 mg.) 180 tablet 3  . naproxen (NAPROSYN) 500 MG tablet Take 500 mg by mouth 2 (two) times daily with a meal.    . pantoprazole (PROTONIX) 20 MG tablet Take 20 mg by mouth daily.    . pregabalin (LYRICA) 75 MG capsule Take 75 mg by mouth 2 (two) times daily.    . sitaGLIPtin-metformin (JANUMET) 50-1000 MG per tablet Take 1 tablet by mouth 2 (two) times daily with a meal.    . vitamin C (ASCORBIC ACID) 500 MG tablet Take 1,000 mg by mouth daily.    . vitamin E 400 UNIT capsule Take 400 Units by mouth daily.     No current facility-administered medications for this visit.    Allergies:   Tetracyclines & related; Banana; and Penicillins    Social History:  The patient  reports that she has been smoking Cigarettes.  She has a 7.5 pack-year smoking history. She has never used smokeless tobacco. She reports that she does not drink alcohol or use illicit drugs.   Family History:  The patient's family history is not on file.    ROS: .   All other systems are reviewed and negative.Unless otherwise mentioned in H&P above.   PHYSICAL EXAM: VS:  There were no vitals taken for this visit. , BMI There is no weight on file to calculate BMI. GEN: Well nourished, well developed, in no acute distress HEENT: normal Neck: no JVD, carotid bruits, or masses Cardiac: RRR; no murmurs, rubs, or gallops,no edema  Respiratory:  clear to auscultation bilaterally, normal work of breathing GI: soft, nontender, nondistended, + BS MS: no deformity or atrophy Skin: warm and dry, no rash Neuro:  Strength and sensation are intact Psych: euthymic mood, full affect  Recent Labs: 10/19/2013: ALT 30; TSH 2.290 02/10/2014: BUN 16; Creatinine 1.15*; Hemoglobin 11.3*; Platelets 277; Potassium 4.6; Sodium  133*    Lipid Panel No results found for: CHOL, TRIG, HDL, CHOLHDL, VLDL, LDLCALC, LDLDIRECT    Wt Readings from Last 3 Encounters:  02/10/14 222 lb 6 oz (100.869 kg)  12/20/13 237 lb (107.502 kg)  11/08/13 238 lb (107.956 kg)      Other studies Reviewed: Additional studies/ records that were reviewed today include: None Review of the above records demonstrates: N/A   ASSESSMENT AND PLAN:  1. Tachycardia: heart rate is well controlled on current medications.  We will give her refills on her beta blocker.  She denies any chest pain.  She does have some fatigue associated  with the use of beta blockers, but it is manageable.  2. Hypertension: blood pressure is well controlled currently.  I do not find any lower extremity edema.  I have cautioned her on overdosing Lasix.  She is on some medications that do have side effects of lower extremity edema, which includes a naproxen,and Lyrica. She is due to have labs by her primary care physician in a couple of weeks.  I have requested copies of these.   Current medicines are reviewed at length with the patient today.    Labs/ tests ordered today include: Copy of labs.  No orders of the defined types were placed in this encounter.     Disposition:   FU with cardiology in 6 months.   Signed, Jory Sims, NP  06/25/2014 7:17 AM    [facility] [address], [place], [place] [address] Phone: [phone]; Fax: [phone]

## 2014-07-13 ENCOUNTER — Encounter (HOSPITAL_COMMUNITY): Payer: Self-pay | Admitting: *Deleted

## 2014-07-13 ENCOUNTER — Emergency Department (HOSPITAL_COMMUNITY)
Admission: EM | Admit: 2014-07-13 | Discharge: 2014-07-13 | Disposition: A | Discharge status: Home / Self Care | Payer: Medicare Other | Attending: Emergency Medicine | Admitting: Emergency Medicine

## 2014-07-13 ENCOUNTER — Emergency Department (HOSPITAL_COMMUNITY): Payer: Medicare Other

## 2014-07-13 DIAGNOSIS — J449 Chronic obstructive pulmonary disease, unspecified: Secondary | ICD-10-CM | POA: Insufficient documentation

## 2014-07-13 DIAGNOSIS — M542 Cervicalgia: Secondary | ICD-10-CM | POA: Diagnosis not present

## 2014-07-13 DIAGNOSIS — R519 Headache, unspecified: Secondary | ICD-10-CM

## 2014-07-13 DIAGNOSIS — Z8739 Personal history of other diseases of the musculoskeletal system and connective tissue: Secondary | ICD-10-CM | POA: Diagnosis not present

## 2014-07-13 DIAGNOSIS — Z79899 Other long term (current) drug therapy: Secondary | ICD-10-CM | POA: Insufficient documentation

## 2014-07-13 DIAGNOSIS — E119 Type 2 diabetes mellitus without complications: Secondary | ICD-10-CM | POA: Insufficient documentation

## 2014-07-13 DIAGNOSIS — K219 Gastro-esophageal reflux disease without esophagitis: Secondary | ICD-10-CM | POA: Diagnosis not present

## 2014-07-13 DIAGNOSIS — Z880 Allergy status to penicillin: Secondary | ICD-10-CM | POA: Diagnosis not present

## 2014-07-13 DIAGNOSIS — R51 Headache: Secondary | ICD-10-CM | POA: Diagnosis not present

## 2014-07-13 DIAGNOSIS — H53149 Visual discomfort, unspecified: Secondary | ICD-10-CM | POA: Insufficient documentation

## 2014-07-13 DIAGNOSIS — R110 Nausea: Secondary | ICD-10-CM | POA: Insufficient documentation

## 2014-07-13 DIAGNOSIS — E039 Hypothyroidism, unspecified: Secondary | ICD-10-CM | POA: Insufficient documentation

## 2014-07-13 DIAGNOSIS — I10 Essential (primary) hypertension: Secondary | ICD-10-CM | POA: Diagnosis not present

## 2014-07-13 DIAGNOSIS — Z794 Long term (current) use of insulin: Secondary | ICD-10-CM | POA: Diagnosis not present

## 2014-07-13 DIAGNOSIS — Z862 Personal history of diseases of the blood and blood-forming organs and certain disorders involving the immune mechanism: Secondary | ICD-10-CM | POA: Diagnosis not present

## 2014-07-13 DIAGNOSIS — Z791 Long term (current) use of non-steroidal anti-inflammatories (NSAID): Secondary | ICD-10-CM | POA: Insufficient documentation

## 2014-07-13 DIAGNOSIS — D649 Anemia, unspecified: Secondary | ICD-10-CM | POA: Insufficient documentation

## 2014-07-13 DIAGNOSIS — M797 Fibromyalgia: Secondary | ICD-10-CM | POA: Diagnosis not present

## 2014-07-13 DIAGNOSIS — Z720 Tobacco use: Secondary | ICD-10-CM | POA: Diagnosis not present

## 2014-07-13 DIAGNOSIS — Z7982 Long term (current) use of aspirin: Secondary | ICD-10-CM | POA: Diagnosis not present

## 2014-07-13 DIAGNOSIS — H9209 Otalgia, unspecified ear: Secondary | ICD-10-CM | POA: Insufficient documentation

## 2014-07-13 MED ORDER — KETOROLAC TROMETHAMINE 30 MG/ML IJ SOLN
30.0000 mg | Freq: Once | INTRAMUSCULAR | Status: AC
  Administered 2014-07-13: 30 mg via INTRAVENOUS
  Filled 2014-07-13: qty 1

## 2014-07-13 MED ORDER — SODIUM CHLORIDE 0.9 % IV BOLUS (SEPSIS)
1000.0000 mL | Freq: Once | INTRAVENOUS | Status: AC
  Administered 2014-07-13: 1000 mL via INTRAVENOUS

## 2014-07-13 MED ORDER — KETOROLAC TROMETHAMINE 30 MG/ML IJ SOLN
15.0000 mg | Freq: Once | INTRAMUSCULAR | Status: AC
  Administered 2014-07-13: 15 mg via INTRAVENOUS
  Filled 2014-07-13: qty 1

## 2014-07-13 MED ORDER — METOCLOPRAMIDE HCL 5 MG/ML IJ SOLN
10.0000 mg | Freq: Once | INTRAMUSCULAR | Status: AC
  Administered 2014-07-13: 10 mg via INTRAVENOUS
  Filled 2014-07-13: qty 2

## 2014-07-13 MED ORDER — PROMETHAZINE HCL 25 MG PO TABS: 25.0000 mg | ORAL_TABLET | Freq: Four times a day (QID) | ORAL | Status: DC | PRN

## 2014-07-13 MED ORDER — DIPHENHYDRAMINE HCL 50 MG/ML IJ SOLN
25.0000 mg | Freq: Once | INTRAMUSCULAR | Status: AC
  Administered 2014-07-13: 25 mg via INTRAVENOUS
  Filled 2014-07-13: qty 1

## 2014-07-13 MED ORDER — HYDROMORPHONE HCL 1 MG/ML IJ SOLN
1.0000 mg | Freq: Once | INTRAMUSCULAR | Status: AC
  Administered 2014-07-13: 1 mg via INTRAVENOUS
  Filled 2014-07-13 (×2): qty 1

## 2014-07-13 MED ORDER — DIPHENHYDRAMINE HCL 50 MG/ML IJ SOLN
12.5000 mg | Freq: Once | INTRAMUSCULAR | Status: AC
  Administered 2014-07-13: 12.5 mg via INTRAVENOUS
  Filled 2014-07-13: qty 1

## 2014-07-13 MED ORDER — KETOROLAC TROMETHAMINE 30 MG/ML IJ SOLN: 15.0000 mg | Freq: Once | INTRAMUSCULAR | Status: DC

## 2014-07-13 MED ORDER — HYDROCODONE-ACETAMINOPHEN 5-325 MG PO TABS: 1.0000 | ORAL_TABLET | ORAL | Status: DC | PRN

## 2014-07-13 NOTE — ED Notes (Signed)
Pt seen in ED earlier for migraine, felt better at DC, when pt got home headache with nausea returned without relief. Pt sensitive to lights and sound.

## 2014-07-13 NOTE — ED Notes (Signed)
Pt. Reports being seen here earlier today for a migraine, pt. Reports upon returning home headache and nausea returned. Pt. Denies vomiting.

## 2014-07-13 NOTE — ED Notes (Signed)
Pt. Resting with eyes closed. Even rise and fall of chest noted.

## 2014-07-13 NOTE — ED Provider Notes (Signed)
CSN: [id number]     Arrival date & time 07/13/14  1505 History  This chart was scribed for Virgel Manifold, MD by Stephania Fragmin, ED Scribe. This patient was seen in room [facility] and the patient's care was started at 11:00 AM.    Chief Complaint  Patient presents with  . Migraine   Patient is a 64 y.o. female presenting with headaches. The history is provided by the patient. No language interpreter was used.  Headache Pain location:  Frontal Quality:  Sharp Radiates to:  Face and L neck Duration:  10 hours Timing:  Constant Relieved by:  Nothing Worsened by:  Nothing Ineffective treatments:  Prescription medications (Vicodin) Associated symptoms: ear pain, nausea, neck pain and photophobia   Associated symptoms: no abdominal pain and no fever     HPI Comments: Stephanie [name] is a 64 y.o. female who presents to the Emergency Department complaining of a severe, constant sharp pain behind her left eye radiating in to her jaw, that began about 10 hours ago which she first thought was a toothache. She endorses nausea, left sided neck pain, ear pain, and photophobia. Patient has a history of migraines, but hasn't had one in 10-15 years. Nothing makes it better or worse. Patient had taken Vicodin with no relief. She denies fever or blurred vision. She has known allergies to tetracycline and penicillin.  Past Medical History  Diagnosis Date  . Diabetes mellitus   . Hypertension   . Fibromyalgia   . Asthma   . PONV (postoperative nausea and vomiting)   . Hyperthyroidism   . Anemia   . H/O hiatal hernia   . GERD (gastroesophageal reflux disease)   . COPD (chronic obstructive pulmonary disease)    Past Surgical History  Procedure Laterality Date  . Cholecystectomy    . Abdominal hysterectomy  partial  . Carpal tunnel release Right 1991  . Wrist ganglion excision Left   . Cataract extraction w/phaco Right 05/08/2013    Procedure: CATARACT EXTRACTION PHACO AND INTRAOCULAR LENS PLACEMENT  (IOC);  Surgeon: Tonny Branch, MD;  Location: [facility];  Service: Ophthalmology;  Laterality: Right;  CDE 10.31  . Cataract extraction w/phaco Left 08/17/2013    Procedure: CATARACT EXTRACTION PHACO AND INTRAOCULAR LENS PLACEMENT (IOC);  Surgeon: Tonny Branch, MD;  Location: [facility];  Service: Ophthalmology;  Laterality: Left;  CDE:9.03  . Dental surgery     History reviewed. No pertinent family history. History  Substance Use Topics  . Smoking status: Current Some Day Smoker -- 0.25 packs/day for 30 years    Types: Cigarettes  . Smokeless tobacco: Never Used     Comment: some day smoker  . Alcohol Use: No   OB History    No data available     Review of Systems  Constitutional: Negative for fever.  HENT: Positive for ear pain.   Eyes: Positive for photophobia. Negative for visual disturbance.  Respiratory: Negative for shortness of breath.   Cardiovascular: Negative for chest pain.  Gastrointestinal: Positive for nausea. Negative for abdominal pain.  Musculoskeletal: Positive for neck pain.  Neurological: Positive for headaches.  All other systems reviewed and are negative.     Allergies  Tetracyclines & related; Banana; and Penicillins  Home Medications   Prior to Admission medications   Medication Sig Start Date End Date Taking? Authorizing Provider  albuterol (PROVENTIL HFA;VENTOLIN HFA) 108 (90 BASE) MCG/ACT inhaler Inhale 2 puffs into the lungs every 6 (six) hours as needed for wheezing or shortness of  breath.   Yes Historical Provider, MD  aspirin EC 81 MG tablet Take 81 mg by mouth at bedtime.    Yes Historical Provider, MD  Bilberry, Vaccinium myrtillus, 100 MG CAPS Take 1 capsule by mouth 2 (two) times daily.   Yes Historical Provider, MD  Cholecalciferol (VITAMIN D) 1000 UNITS capsule Take 1,000 Units by mouth 2 (two) times daily.    Yes Historical Provider, MD  Cyanocobalamin (VITAMIN B-12 PO) Take 1 tablet by mouth daily.     Yes Historical Provider, MD  cyclobenzaprine  (FLEXERIL) 10 MG tablet Take 10 mg by mouth 3 (three) times daily as needed for muscle spasms.   Yes Historical Provider, MD  diphenhydrAMINE (BENADRYL) 25 MG tablet Take 50 mg by mouth daily as needed for allergies.   Yes Historical Provider, MD  furosemide (LASIX) 40 MG tablet Take 0.5 tablets (20 mg total) by mouth 2 (two) times daily. Patient taking differently: Take 40 mg by mouth daily as needed for fluid.  06/25/14  Yes Lendon Colonel, NP  insulin aspart (NOVOLOG FLEXPEN) 100 UNIT/ML FlexPen Inject 22 Units into the skin 3 (three) times daily with meals.    Yes Historical Provider, MD  Insulin Detemir (LEVEMIR FLEXPEN) 100 UNIT/ML Pen Inject 60 Units into the skin 2 (two) times daily.    Yes Historical Provider, MD  levothyroxine (SYNTHROID, LEVOTHROID) 100 MCG tablet Take 100 mcg by mouth daily before breakfast.  11/23/13  Yes Historical Provider, MD  lisinopril (PRINIVIL,ZESTRIL) 10 MG tablet Take 10 mg by mouth daily.   Yes Historical Provider, MD  Melatonin 5 MG CAPS Take 2 capsules by mouth at bedtime.   Yes Historical Provider, MD  metoprolol (LOPRESSOR) 100 MG tablet Take 1 tablet (100 mg total) by mouth 2 (two) times daily. Take with 25 mg tablet for total dosage of 186m. 06/25/14  Yes KLendon Colonel NP  metoprolol tartrate (LOPRESSOR) 25 MG tablet Take 1 tablet (25 mg total) by mouth 2 (two) times daily. Take with 100 mg tablet for total dosage of 125 mg. 06/25/14  Yes KLendon Colonel NP  naproxen (NAPROSYN) 500 MG tablet Take 500 mg by mouth 2 (two) times daily with a meal.   Yes Historical Provider, MD  pantoprazole (PROTONIX) 20 MG tablet Take 20 mg by mouth daily.   Yes Historical Provider, MD  pregabalin (LYRICA) 75 MG capsule Take 75 mg by mouth 2 (two) times daily.   Yes Historical Provider, MD  sitaGLIPtin-metformin (JANUMET) 50-1000 MG per tablet Take 1 tablet by mouth 2 (two) times daily with a meal.   Yes Historical Provider, MD  vitamin C (ASCORBIC ACID) 500 MG  tablet Take 1,000 mg by mouth daily.   Yes Historical Provider, MD  vitamin E 400 UNIT capsule Take 400 Units by mouth daily.   Yes Historical Provider, MD  BAYER CONTOUR TEST test strip  11/06/13   Historical Provider, MD  HYDROcodone-acetaminophen (NORCO/VICODIN) 5-325 MG per tablet Take 1 tablet by mouth every 4 (four) hours as needed for moderate pain. Patient not taking: Reported on 07/13/2014 10/22/13   DSamuella Cota MD   BP 125/65 mmHg  Pulse 104  Temp(Src) 97.7 F (36.5 C) (Oral)  Resp 20  Ht 5' 6"  (1.676 m)  Wt 230 lb (104.327 kg)  BMI 37.14 kg/m2  SpO2 98% Physical Exam  Constitutional: She is oriented to person, place, and time. She appears well-developed and well-nourished. No distress.  HENT:  Head: Normocephalic and atraumatic.  Mild  TTP left face. No swelling or rash. Left TM clear.   Eyes: Conjunctivae are normal. Right eye exhibits no discharge. Left eye exhibits no discharge.  Neck: Neck supple.  Neck supple.   Cardiovascular: Normal rate, regular rhythm and normal heart sounds.  Exam reveals no gallop and no friction rub.   No murmur heard. Pulmonary/Chest: Effort normal and breath sounds normal. No respiratory distress.  Abdominal: Soft. She exhibits no distension. There is no tenderness.  Musculoskeletal: She exhibits no edema or tenderness.  Neurological: She is alert and oriented to person, place, and time. No cranial nerve deficit. She exhibits normal muscle tone. Coordination normal.  Skin: Skin is warm and dry.  Psychiatric: She has a normal mood and affect. Her behavior is normal. Thought content normal.  Nursing note and vitals reviewed.   ED Course  Procedures (including critical care time)  DIAGNOSTIC STUDIES: Oxygen Saturation is 98% on RA, normal by my interpretation.    COORDINATION OF CARE: 11:08 AM - Discussed treatment plan with pt at bedside which includes IV medications, and pt agreed to plan.   Labs Review Labs Reviewed - No data to  display  Imaging Review No results found.   EKG Interpretation None      MDM   Final diagnoses:  Acute nonintractable headache, unspecified headache type    I personally preformed the services scribed in my presence. The recorded information has been reviewed is accurate. Virgel Manifold, MD.     Virgel Manifold, MD 07/19/14 [id number]

## 2014-07-13 NOTE — ED Notes (Signed)
Pt. Given ice chips per request.

## 2014-07-13 NOTE — Discharge Instructions (Signed)

## 2014-07-13 NOTE — Discharge Instructions (Signed)
Prescription for pain and nausea. Scan of your head was normal. Rest in quiet dark room. Increase fluids.

## 2014-07-13 NOTE — ED Provider Notes (Signed)
CSN: [id number]     Arrival date & time 07/13/14  1650 History   First MD Initiated Contact with Patient 07/13/14 1856     Chief Complaint  Patient presents with  . Headache     (Consider location/radiation/quality/duration/timing/severity/associated sxs/prior Treatment) HPI.... Patient seen earlier in the day for a headache. She returns tonight with a persistent left frontal headache that radiates to the cheek. Minimal nausea and photophobia. No fever, chills, stiff neck, neurodeficits. She has a history of migraine headaches. Severity is mild to moderate. Nothing makes symptoms better or worse.  Past Medical History  Diagnosis Date  . Diabetes mellitus   . Hypertension   . Fibromyalgia   . Asthma   . PONV (postoperative nausea and vomiting)   . Hyperthyroidism   . Anemia   . H/O hiatal hernia   . GERD (gastroesophageal reflux disease)   . COPD (chronic obstructive pulmonary disease)    Past Surgical History  Procedure Laterality Date  . Cholecystectomy    . Abdominal hysterectomy  partial  . Carpal tunnel release Right 1991  . Wrist ganglion excision Left   . Cataract extraction w/phaco Right 05/08/2013    Procedure: CATARACT EXTRACTION PHACO AND INTRAOCULAR LENS PLACEMENT (IOC);  Surgeon: Tonny Branch, MD;  Location: [facility];  Service: Ophthalmology;  Laterality: Right;  CDE 10.31  . Cataract extraction w/phaco Left 08/17/2013    Procedure: CATARACT EXTRACTION PHACO AND INTRAOCULAR LENS PLACEMENT (IOC);  Surgeon: Tonny Branch, MD;  Location: [facility];  Service: Ophthalmology;  Laterality: Left;  CDE:9.03  . Dental surgery     History reviewed. No pertinent family history. History  Substance Use Topics  . Smoking status: Current Some Day Smoker -- 0.25 packs/day for 30 years    Types: Cigarettes  . Smokeless tobacco: Never Used     Comment: some day smoker  . Alcohol Use: No   OB History    No data available     Review of Systems  All other systems reviewed and are  negative.     Allergies  Tetracyclines & related; Banana; and Penicillins  Home Medications   Prior to Admission medications   Medication Sig Start Date End Date Taking? Authorizing Provider  albuterol (PROVENTIL HFA;VENTOLIN HFA) 108 (90 BASE) MCG/ACT inhaler Inhale 2 puffs into the lungs every 6 (six) hours as needed for wheezing or shortness of breath.   Yes Historical Provider, MD  aspirin EC 81 MG tablet Take 81 mg by mouth at bedtime.    Yes Historical Provider, MD  Bilberry, Vaccinium myrtillus, 100 MG CAPS Take 1 capsule by mouth 2 (two) times daily.   Yes Historical Provider, MD  Cholecalciferol (VITAMIN D) 1000 UNITS capsule Take 1,000 Units by mouth 2 (two) times daily.    Yes Historical Provider, MD  Cyanocobalamin (VITAMIN B-12 PO) Take 1 tablet by mouth daily.     Yes Historical Provider, MD  cyclobenzaprine (FLEXERIL) 10 MG tablet Take 10 mg by mouth 3 (three) times daily as needed for muscle spasms.   Yes Historical Provider, MD  diphenhydrAMINE (BENADRYL) 25 MG tablet Take 50 mg by mouth daily as needed for allergies.   Yes Historical Provider, MD  furosemide (LASIX) 40 MG tablet Take 0.5 tablets (20 mg total) by mouth 2 (two) times daily. Patient taking differently: Take 40 mg by mouth daily as needed for fluid.  06/25/14  Yes Lendon Colonel, NP  insulin aspart (NOVOLOG FLEXPEN) 100 UNIT/ML FlexPen Inject 22 Units into the skin 3 (  three) times daily with meals.    Yes Historical Provider, MD  Insulin Detemir (LEVEMIR FLEXPEN) 100 UNIT/ML Pen Inject 60 Units into the skin 2 (two) times daily.    Yes Historical Provider, MD  levothyroxine (SYNTHROID, LEVOTHROID) 100 MCG tablet Take 100 mcg by mouth daily before breakfast.  11/23/13  Yes Historical Provider, MD  lisinopril (PRINIVIL,ZESTRIL) 10 MG tablet Take 10 mg by mouth daily.   Yes Historical Provider, MD  Melatonin 5 MG CAPS Take 2 capsules by mouth at bedtime.   Yes Historical Provider, MD  metoprolol (LOPRESSOR) 100  MG tablet Take 1 tablet (100 mg total) by mouth 2 (two) times daily. Take with 25 mg tablet for total dosage of 125mg . 06/25/14  Yes Lendon Colonel, NP  metoprolol tartrate (LOPRESSOR) 25 MG tablet Take 1 tablet (25 mg total) by mouth 2 (two) times daily. Take with 100 mg tablet for total dosage of 125 mg. 06/25/14  Yes Lendon Colonel, NP  naproxen (NAPROSYN) 500 MG tablet Take 500 mg by mouth 2 (two) times daily with a meal.   Yes Historical Provider, MD  pantoprazole (PROTONIX) 20 MG tablet Take 20 mg by mouth daily.   Yes Historical Provider, MD  pregabalin (LYRICA) 75 MG capsule Take 75 mg by mouth 2 (two) times daily.   Yes Historical Provider, MD  sitaGLIPtin-metformin (JANUMET) 50-1000 MG per tablet Take 1 tablet by mouth 2 (two) times daily with a meal.   Yes Historical Provider, MD  vitamin C (ASCORBIC ACID) 500 MG tablet Take 1,000 mg by mouth daily.   Yes Historical Provider, MD  vitamin E 400 UNIT capsule Take 400 Units by mouth daily.   Yes Historical Provider, MD  BAYER CONTOUR TEST test strip  11/06/13   Historical Provider, MD  HYDROcodone-acetaminophen (NORCO/VICODIN) 5-325 MG per tablet Take 1-2 tablets by mouth every 4 (four) hours as needed. 07/13/14   Nat Christen, MD  promethazine (PHENERGAN) 25 MG tablet Take 1 tablet (25 mg total) by mouth every 6 (six) hours as needed. 07/13/14   Nat Christen, MD   BP 122/68 mmHg  Pulse 87  Temp(Src) 97.3 F (36.3 C) (Oral)  Resp 14  Ht 5\' 6"  (1.676 m)  Wt 230 lb (104.327 kg)  BMI 37.14 kg/m2  SpO2 95% Physical Exam  Constitutional: She is oriented to person, place, and time.  Slightly photophobic  HENT:  Head: Normocephalic and atraumatic.  Eyes: Conjunctivae and EOM are normal. Pupils are equal, round, and reactive to light.  Neck: Normal range of motion. Neck supple.  No stiff neck  Cardiovascular: Normal rate and regular rhythm.   Pulmonary/Chest: Effort normal and breath sounds normal.  Abdominal: Soft. Bowel sounds are  normal.  Musculoskeletal: Normal range of motion.  Neurological: She is alert and oriented to person, place, and time.  Skin: Skin is warm and dry.  Psychiatric: She has a normal mood and affect. Her behavior is normal.  Nursing note and vitals reviewed.   ED Course  Procedures (including critical care time) Labs Review Labs Reviewed - No data to display  Imaging Review Ct Head Wo Contrast  07/13/2014   CLINICAL DATA:  severe, constant sharp pain behind her left eye radiating in to her jaw, that began about 10 hours ago which she first thought was a toothache. She endorses nausea, left sided neck pain, ear pain, and photophobia. Patient has a history of migraines, but hasn't had one in 10-15 years  EXAM: CT HEAD WITHOUT CONTRAST  TECHNIQUE: Contiguous axial images were obtained from the base of the skull through the vertex without intravenous contrast.  COMPARISON:  11/29/2009  FINDINGS: Ventricles are normal in size and configuration. There are no parenchymal masses or mass effect. There is no evidence of an infarct. There are no extra-axial masses or abnormal fluid collections. There is no intracranial hemorrhage.  Visualized sinuses and mastoid air cells are clear. No skull lesion.  IMPRESSION: Normal unenhanced CT scan of the brain.   Electronically Signed   By: Lajean Manes M.D.   On: 07/13/2014 20:12     EKG Interpretation None      MDM   Final diagnoses:  Headache, unspecified headache type    CT head negative. Patient feels better after IV fluids and pain management. Discharge medications Percocet and Phenergan 25 mg    Nat Christen, MD 07/13/14 2329

## 2014-07-13 NOTE — ED Notes (Signed)
Pt. Does not want an IV started until she is seen by EDP.

## 2014-07-13 NOTE — ED Notes (Signed)
Pt co headache that starts around lt ear and eye into upper lt gums, co nausea, light and sound sensitivity, pt has prior hx of migraine. Symptoms started at 0100 today. No deficits noted in extremities, grips, or facial movement.

## 2014-07-13 NOTE — ED Notes (Signed)
Patient states she would like something else for pain. RN made aware.

## 2014-07-16 ENCOUNTER — Encounter (HOSPITAL_COMMUNITY): Payer: Self-pay | Admitting: Emergency Medicine

## 2014-07-16 ENCOUNTER — Emergency Department (HOSPITAL_COMMUNITY)
Admission: EM | Admit: 2014-07-16 | Discharge: 2014-07-16 | Disposition: A | Discharge status: Home / Self Care | Payer: Medicare Other | Attending: Emergency Medicine | Admitting: Emergency Medicine

## 2014-07-16 DIAGNOSIS — Z79899 Other long term (current) drug therapy: Secondary | ICD-10-CM | POA: Insufficient documentation

## 2014-07-16 DIAGNOSIS — Z7982 Long term (current) use of aspirin: Secondary | ICD-10-CM | POA: Insufficient documentation

## 2014-07-16 DIAGNOSIS — Z862 Personal history of diseases of the blood and blood-forming organs and certain disorders involving the immune mechanism: Secondary | ICD-10-CM | POA: Diagnosis not present

## 2014-07-16 DIAGNOSIS — Z792 Long term (current) use of antibiotics: Secondary | ICD-10-CM | POA: Diagnosis not present

## 2014-07-16 DIAGNOSIS — R51 Headache: Secondary | ICD-10-CM | POA: Insufficient documentation

## 2014-07-16 DIAGNOSIS — K219 Gastro-esophageal reflux disease without esophagitis: Secondary | ICD-10-CM | POA: Insufficient documentation

## 2014-07-16 DIAGNOSIS — Z720 Tobacco use: Secondary | ICD-10-CM | POA: Insufficient documentation

## 2014-07-16 DIAGNOSIS — J449 Chronic obstructive pulmonary disease, unspecified: Secondary | ICD-10-CM | POA: Diagnosis not present

## 2014-07-16 DIAGNOSIS — H9202 Otalgia, left ear: Secondary | ICD-10-CM | POA: Diagnosis not present

## 2014-07-16 DIAGNOSIS — R000 Tachycardia, unspecified: Secondary | ICD-10-CM | POA: Diagnosis not present

## 2014-07-16 DIAGNOSIS — J029 Acute pharyngitis, unspecified: Secondary | ICD-10-CM

## 2014-07-16 DIAGNOSIS — Z794 Long term (current) use of insulin: Secondary | ICD-10-CM | POA: Diagnosis not present

## 2014-07-16 DIAGNOSIS — I10 Essential (primary) hypertension: Secondary | ICD-10-CM | POA: Diagnosis not present

## 2014-07-16 DIAGNOSIS — Z880 Allergy status to penicillin: Secondary | ICD-10-CM | POA: Insufficient documentation

## 2014-07-16 DIAGNOSIS — E119 Type 2 diabetes mellitus without complications: Secondary | ICD-10-CM | POA: Insufficient documentation

## 2014-07-16 MED ORDER — CLINDAMYCIN HCL 150 MG PO CAPS: 300.0000 mg | ORAL_CAPSULE | Freq: Three times a day (TID) | ORAL | Status: DC

## 2014-07-16 MED ORDER — CIPROFLOXACIN-DEXAMETHASONE 0.3-0.1 % OT SUSP
4.0000 [drp] | Freq: Two times a day (BID) | OTIC | Status: DC
  Administered 2014-07-16: 4 [drp] via OTIC
  Filled 2014-07-16: qty 7.5

## 2014-07-16 NOTE — ED Notes (Signed)
Pharmacy contacted for Ciprodex.

## 2014-07-16 NOTE — Discharge Instructions (Signed)
4 drops of antibiotic (ciprodex) every 4 hours Clindamycin as prescribed Motrin or tylenol for pain Return for severe pain / swelling / high fevers  Please call your doctor for a followup appointment within 24-48 hours. When you talk to your doctor please let them know that you were seen in the emergency department and have them acquire all of your records so that they can discuss the findings with you and formulate a treatment plan to fully care for your new and ongoing problems.

## 2014-07-16 NOTE — ED Notes (Signed)
Pt states that she has a very bad sore throat and earache.  States this has been occuring since Friday and that she was seen for it but not given any abx.

## 2014-07-16 NOTE — ED Provider Notes (Signed)
CSN: [id number]     Arrival date & time 07/16/14  10 History   First MD Initiated Contact with Patient 07/16/14 1546     Chief Complaint  Patient presents with  . Headache  . Sore Throat  . Otalgia     (Consider location/radiation/quality/duration/timing/severity/associated sxs/prior Treatment) HPI Comments: The pt is a 64 y/o female with hx of DM, Htn and COPD - she had an I and D in her R submental / submandibular area in 12/15 after oral surgery (I&D occurred at [facility] as inpatient).  She developed headache and sore throat 3 days ago - she had 2 visits in the same day for the headache - had neg CT head without contrast - improved with the headache but over last 3 days has had gradually worsening sore throat / ear pain on the L.  No meds for this pta - no difficulty swallowing, breathing or talking.  Sx are constant, worse with palpation of the L ear.  No pain with opening jaw.  Patient is a 64 y.o. female presenting with headaches, pharyngitis, and ear pain. The history is provided by the patient and medical records.  Headache Associated symptoms: ear pain   Sore Throat Associated symptoms include headaches.  Otalgia Associated symptoms: headaches     Past Medical History  Diagnosis Date  . Diabetes mellitus   . Hypertension   . Fibromyalgia   . Asthma   . PONV (postoperative nausea and vomiting)   . Hyperthyroidism   . Anemia   . H/O hiatal hernia   . GERD (gastroesophageal reflux disease)   . COPD (chronic obstructive pulmonary disease)    Past Surgical History  Procedure Laterality Date  . Cholecystectomy    . Abdominal hysterectomy  partial  . Carpal tunnel release Right 1991  . Wrist ganglion excision Left   . Cataract extraction w/phaco Right 05/08/2013    Procedure: CATARACT EXTRACTION PHACO AND INTRAOCULAR LENS PLACEMENT (IOC);  Surgeon: Tonny Branch, MD;  Location: [facility];  Service: Ophthalmology;  Laterality: Right;  CDE 10.31  . Cataract  extraction w/phaco Left 08/17/2013    Procedure: CATARACT EXTRACTION PHACO AND INTRAOCULAR LENS PLACEMENT (IOC);  Surgeon: Tonny Branch, MD;  Location: [facility];  Service: Ophthalmology;  Laterality: Left;  CDE:9.03  . Dental surgery     History reviewed. No pertinent family history. History  Substance Use Topics  . Smoking status: Current Some Day Smoker -- 0.25 packs/day for 30 years    Types: Cigarettes  . Smokeless tobacco: Never Used     Comment: some day smoker  . Alcohol Use: No   OB History    No data available     Review of Systems  HENT: Positive for ear pain.   Neurological: Positive for headaches.  All other systems reviewed and are negative.     Allergies  Tetracyclines & related; Banana; and Penicillins  Home Medications   Prior to Admission medications   Medication Sig Start Date End Date Taking? Authorizing Provider  albuterol (PROVENTIL HFA;VENTOLIN HFA) 108 (90 BASE) MCG/ACT inhaler Inhale 2 puffs into the lungs every 6 (six) hours as needed for wheezing or shortness of breath.    Historical Provider, MD  aspirin EC 81 MG tablet Take 81 mg by mouth at bedtime.     Historical Provider, MD  BAYER CONTOUR TEST test strip  11/06/13   Historical Provider, MD  Bilberry, Vaccinium myrtillus, 100 MG CAPS Take 1 capsule by mouth 2 (two) times daily.  Historical Provider, MD  Cholecalciferol (VITAMIN D) 1000 UNITS capsule Take 1,000 Units by mouth 2 (two) times daily.     Historical Provider, MD  clindamycin (CLEOCIN) 150 MG capsule Take 2 capsules (300 mg total) by mouth 3 (three) times daily. May dispense as 139m capsules 07/16/14   BNoemi Chapel MD  Cyanocobalamin (VITAMIN B-12 PO) Take 1 tablet by mouth daily.      Historical Provider, MD  cyclobenzaprine (FLEXERIL) 10 MG tablet Take 10 mg by mouth 3 (three) times daily as needed for muscle spasms.    Historical Provider, MD  diphenhydrAMINE (BENADRYL) 25 MG tablet Take 50 mg by mouth daily as needed for allergies.     Historical Provider, MD  furosemide (LASIX) 40 MG tablet Take 0.5 tablets (20 mg total) by mouth 2 (two) times daily. Patient taking differently: Take 40 mg by mouth daily as needed for fluid.  06/25/14   KLendon Colonel NP  HYDROcodone-acetaminophen (NORCO/VICODIN) 5-325 MG per tablet Take 1-2 tablets by mouth every 4 (four) hours as needed. 07/13/14   BNat Christen MD  insulin aspart (NOVOLOG FLEXPEN) 100 UNIT/ML FlexPen Inject 22 Units into the skin 3 (three) times daily with meals.     Historical Provider, MD  Insulin Detemir (LEVEMIR FLEXPEN) 100 UNIT/ML Pen Inject 60 Units into the skin 2 (two) times daily.     Historical Provider, MD  levothyroxine (SYNTHROID, LEVOTHROID) 100 MCG tablet Take 100 mcg by mouth daily before breakfast.  11/23/13   Historical Provider, MD  lisinopril (PRINIVIL,ZESTRIL) 10 MG tablet Take 10 mg by mouth daily.    Historical Provider, MD  Melatonin 5 MG CAPS Take 2 capsules by mouth at bedtime.    Historical Provider, MD  metoprolol (LOPRESSOR) 100 MG tablet Take 1 tablet (100 mg total) by mouth 2 (two) times daily. Take with 25 mg tablet for total dosage of 124m 06/25/14   KaLendon ColonelNP  metoprolol tartrate (LOPRESSOR) 25 MG tablet Take 1 tablet (25 mg total) by mouth 2 (two) times daily. Take with 100 mg tablet for total dosage of 125 mg. 06/25/14   KaLendon ColonelNP  naproxen (NAPROSYN) 500 MG tablet Take 500 mg by mouth 2 (two) times daily with a meal.    Historical Provider, MD  pantoprazole (PROTONIX) 20 MG tablet Take 20 mg by mouth daily.    Historical Provider, MD  pregabalin (LYRICA) 75 MG capsule Take 75 mg by mouth 2 (two) times daily.    Historical Provider, MD  promethazine (PHENERGAN) 25 MG tablet Take 1 tablet (25 mg total) by mouth every 6 (six) hours as needed. 07/13/14   BrNat ChristenMD  sitaGLIPtin-metformin (JANUMET) 50-1000 MG per tablet Take 1 tablet by mouth 2 (two) times daily with a meal.    Historical Provider, MD  vitamin C  (ASCORBIC ACID) 500 MG tablet Take 1,000 mg by mouth daily.    Historical Provider, MD  vitamin E 400 UNIT capsule Take 400 Units by mouth daily.    Historical Provider, MD   BP 151/89 mmHg  Pulse 118  Temp(Src) 98.7 F (37.1 C) (Oral)  Resp 20  Ht 5' 6"  (1.676 m)  Wt 231 lb (104.781 kg)  BMI 37.30 kg/m2  SpO2 99% Physical Exam  Constitutional: She appears well-developed and well-nourished. No distress.  HENT:  Head: Normocephalic and atraumatic.  Mouth/Throat: Oropharynx is clear and moist. No oropharyngeal exudate.  OP is clear - entire OP is visulaized without swelling, redness, purulence or  any other acute findings - no trismus.  TM's visualized and clear bialterally - there is tenderness with manipulation of the L auricle and tragus.  There is a normal light reflex on the TM.  No bulging and no swelling of the EAC.  Eyes: Conjunctivae and EOM are normal. Pupils are equal, round, and reactive to light. Right eye exhibits no discharge. Left eye exhibits no discharge. No scleral icterus.  Neck: Normal range of motion. Neck supple. No JVD present. No thyromegaly present.  Very supple neck, no swelling, no asymetry, no LAD, no torticollis.  Cardiovascular: Exam reveals no friction rub.   No murmur heard. Mild tachycardia  Pulmonary/Chest: Effort normal and breath sounds normal. No respiratory distress. She has no wheezes. She has no rales.  Lymphadenopathy:    She has no cervical adenopathy.  Neurological: She is alert. Coordination normal.  Speech is clear, phonation is normal  Skin: Skin is warm and dry. No rash noted. No erythema.  Psychiatric: She has a normal mood and affect. Her behavior is normal.  Nursing note and vitals reviewed.   ED Course  Procedures (including critical care time) Labs Review Labs Reviewed - No data to display  Imaging Review No results found.   MDM   Final diagnoses:  Earache, left  Pharyngitis    Likely has some element of OE, she has  increased concern with her sore throat and ear pain but no signs of abscess / toxic process.  She has no fever and no signs of systemic infection other than mild tachycardia which has improved since arrival without intervention.  She is concerned that there may be recurrent neck abscess (similar to 12/15) but there is no signs of that at this time.  Meds given in ED:  Medications  ciprofloxacin-dexamethasone (CIPRODEX) 0.3-0.1 % otic suspension 4 drop (not administered)    New Prescriptions   CLINDAMYCIN (CLEOCIN) 150 MG CAPSULE    Take 2 capsules (300 mg total) by mouth 3 (three) times daily. May dispense as 168m capsules        BNoemi Chapel MD 07/16/14 [phone]

## 2014-11-05 ENCOUNTER — Ambulatory Visit (HOSPITAL_COMMUNITY): Payer: Medicare Other | Attending: Preventative Medicine | Admitting: Physical Therapy

## 2014-11-05 DIAGNOSIS — R198 Other specified symptoms and signs involving the digestive system and abdomen: Secondary | ICD-10-CM | POA: Insufficient documentation

## 2014-11-05 DIAGNOSIS — R6889 Other general symptoms and signs: Secondary | ICD-10-CM | POA: Insufficient documentation

## 2014-11-05 DIAGNOSIS — R293 Abnormal posture: Secondary | ICD-10-CM

## 2014-11-05 DIAGNOSIS — M6281 Muscle weakness (generalized): Secondary | ICD-10-CM | POA: Diagnosis present

## 2014-11-05 DIAGNOSIS — R262 Difficulty in walking, not elsewhere classified: Secondary | ICD-10-CM | POA: Diagnosis present

## 2014-11-05 DIAGNOSIS — M5441 Lumbago with sciatica, right side: Secondary | ICD-10-CM | POA: Diagnosis present

## 2014-11-05 NOTE — Therapy (Signed)
[place], [place], [address] Phone: [phone]   Fax:  [phone]  Physical Therapy Evaluation  Patient Details  Name: Stephanie [name] MRN: [id number] Date of Birth: 24-Oct-1950 Referring Provider:  Leisa Lenz, MD  Encounter Date: 11/05/2014      PT End of Session - 11/05/14 1404    Visit Number 1   Number of Visits 16   Date for PT Re-Evaluation 12/03/14   Authorization Type Medicare/Medicaid    Authorization Time Period 11/05/14 to 01/05/15   Authorization - Visit Number 1   Authorization - Number of Visits 10   PT Start Time 0160   PT Stop Time 1400   PT Time Calculation (min) 39 min   Activity Tolerance Patient limited by pain   Behavior During Therapy [facility] for tasks assessed/performed      Past Medical History  Diagnosis Date  . Diabetes mellitus   . Hypertension   . Fibromyalgia   . Asthma   . PONV (postoperative nausea and vomiting)   . Hyperthyroidism   . Anemia   . H/O hiatal hernia   . GERD (gastroesophageal reflux disease)   . COPD (chronic obstructive pulmonary disease)     Past Surgical History  Procedure Laterality Date  . Cholecystectomy    . Abdominal hysterectomy  partial  . Carpal tunnel release Right 1991  . Wrist ganglion excision Left   . Cataract extraction w/phaco Right 05/08/2013    Procedure: CATARACT EXTRACTION PHACO AND INTRAOCULAR LENS PLACEMENT (IOC);  Surgeon: Tonny Branch, MD;  Location: [facility];  Service: Ophthalmology;  Laterality: Right;  CDE 10.31  . Cataract extraction w/phaco Left 08/17/2013    Procedure: CATARACT EXTRACTION PHACO AND INTRAOCULAR LENS PLACEMENT (IOC);  Surgeon: Tonny Branch, MD;  Location: [facility];  Service: Ophthalmology;  Laterality: Left;  CDE:9.03  . Dental surgery      There were no vitals filed for this visit.  Visit Diagnosis:  Midline low back pain with right-sided sciatica - Plan: PT plan of care cert/re-cert  Poor posture - Plan: PT  plan of care cert/re-cert  Muscle weakness (generalized) - Plan: PT plan of care cert/re-cert  Abdominal weakness - Plan: PT plan of care cert/re-cert  Decreased functional activity tolerance - Plan: PT plan of care cert/re-cert  Difficulty walking - Plan: PT plan of care cert/re-cert      Subjective Assessment - 11/05/14 1325    Subjective If she makes an awkward move at times, the spurs in her back hit a nerve and she feels paralyzed. Pain goes back and forth; when she is really hurting she feels like she has to lean forward. Sometimes feels stiff but not all the time. Has Fibromyalgia and is not sure if she has had numbness or tingling.  Symptoms can go down R leg more than L, but will still go down both at times.    Pertinent History Patient has had back pain for quite awhile; it gets worse at times and got very unbearable on Wednesday night through Friday, when she went to the urgent center. Reports that she has been told in the past that her back is inoperable and that she has spurs in her back; she has a cluster at the base of her spine and then another cluster a few levels up.    How long can you sit comfortably? Depends- if she shifts to one side pain can ease up, but overall generally up to 30 minutes  How long can you stand comfortably? 10-15 minutes before she needs to lean on something    How long can you walk comfortably? On good days, 300-42ft; on bad days as little as 41ft    Patient Stated Goals get rid of pain, get stronger    Currently in Pain? Yes   Pain Score 5    Pain Location Back   Pain Orientation Lower            [facility] PT Assessment - 11/05/14 0001    Assessment   Medical Diagnosis lumbar radiculopathy    Onset Date/Surgical Date --  chronic    Next MD Visit Does not have a provider; going to [place] urgent center today and doesn't know if she will return to Dr. Jomarie Longs or to [facility]    Precautions   Precautions None   Restrictions   Weight  Bearing Restrictions No   Balance Screen   Has the patient fallen in the past 6 months Yes   How many times? 1- legs just gave out and she went down    Has the patient had a decrease in activity level because of a fear of falling?  Yes   Is the patient reluctant to leave their home because of a fear of falling?  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, forward head with B IR shoulders, reduced weight bearing R LE,  flat T-spine, increased lumbar curve    AROM   Right Hip External Rotation  --  WFL but stiff    Right Hip Internal Rotation  43   Left Hip External Rotation  --  WFL but stiff    Left Hip Internal Rotation  35   Lumbar Flexion 55   Lumbar Extension 6   Lumbar - Right Side Bend 17   Lumbar - Left Side Bend 20   Strength   Overall Strength Comments core strength appox 2/5, pain limited    Right Hip Flexion 4/5   Right Hip Extension 2/5   Right Hip ABduction 2/5   Left Hip Flexion 4/5   Left Hip Extension 3/5   Left Hip ABduction 4-/5   Right Knee Flexion 4-/5   Right Knee Extension 3+/5   Left Knee Flexion 4/5   Left Knee Extension 4/5   Right Ankle Dorsiflexion 5/5   Left Ankle Dorsiflexion 5/5   Flexibility   Hamstrings limited by pain in back during functional testing    Quadriceps moderate limitation limited by pain    Ambulation/Gait   Gait Comments reduced gait speed, flexed at hips, reduced rotation of trunk and pelvis, reduced DF B, reduced step length B, reduced gait tolerance in general, proximal muscle weakness                            PT Education - 11/05/14 1402    Education provided Yes   Education Details prognosis,  plan of care, HEP; educated that due to time needed to gain physical strength and mobility, 2 weeks only would likely not cause significant improvement in symptoms    Person(s) Educated Patient   Methods Explanation;Handout  Comprehension Verbalized understanding;Returned demonstration          PT Short Term Goals - 11/05/14 1409    PT SHORT TERM GOAL #1   Title Patient will experience no more than 4/10 pain in her low back at all times with functional tasks and activities    Time 4   Period Weeks   Status New   PT SHORT TERM GOAL #2   Title Patient will demonstrate an improvement of at least 15 degrees in lumbar flexion and lateral flexion, and at least 5 degrees in lumbar extension, no increase in pain    Time 4   Period Weeks   Status New   PT SHORT TERM GOAL #3   Title Patient will be able to ambulate at least 30 minutes with her cane and pain no more than 4/10, no rest breaks    Time 4   Period Weeks   Status New   PT SHORT TERM GOAL #4   Title Patient will be able to verbalize the importance of correct posture, and will demonstrate the ablity to maintain correct posture in functional scenarios with cues no more than 20% of the time    PT SHORT TERM GOAL #5   Title Patient to be independent in correctly and consistently performing appropriate HEP, to be updated PRN    Time 4   Period Weeks   Status New           PT Long Term Goals - 11/05/14 1413    PT LONG TERM GOAL #1   Title Patient will demonstrate an increase of at least 1.5 strength grades in all tested muscles in bilateral LEs, proximal muscles, and core strength    Time 8   Period Weeks   Status New   PT LONG TERM GOAL #2   Title Patient will experience no more than 3/10 pain in her low back during all functional activities and scenarios of at least 90 minutes in duration   Time 8   Period Weeks   Status New   PT LONG TERM GOAL #3   Title Patient will consistently demonstrate correct lifting mechanics  when picking up items from floor, of no more than 5# in weight, with no cues and on an independent basis    Time 8   Period Weeks   Status New   PT LONG TERM GOAL #4   Title Patient to be independent in corrrectly and consistently performing appropriate advanced HEP, including functional stretching techniques that will not exacerbate her pain    Time 8   Period Weeks   Status New               Plan - 11/05/14 1404    Clinical Impression Statement Patient presents with significant amounts of back pain, lumbar and bilateral hip stiffness, muscle tightness, gait and postural impairment, reduced functional activity tolerance, muscle weakness, and reduced functional task performance skills. Patient reports that she was told in the 90s that her back was inoperable and that she has clusters of spurs located in multple places in her low back. She also presents with multiple areas of scabs and scar tissue on her legs and arms, whcih she reports her MD thinks is related to her diabetes. Created appropriate HEP, which was adjusted as needed as some exercises did exacerbate symptoms. At this time the patient may benefit from skilled PT services in order to address her functional impairemnts and assist her in reaching an optimal level  of function.    Pt will benefit from skilled therapeutic intervention in order to improve on the following deficits Abnormal gait;Decreased endurance;Hypomobility;Obesity;Decreased activity tolerance;Decreased strength;Pain;Decreased balance;Decreased mobility;Difficulty walking;Improper body mechanics;Decreased coordination;Impaired flexibility;Postural dysfunction   Rehab Potential Fair   Clinical Impairments Affecting Rehab Potential chronic back pain of significant extent, fibromyalgia    PT Frequency 2x / week   PT Duration 8 weeks   PT Treatment/Interventions ADLs/Self Care Home Management;Moist Heat;Gait training;Stair training;Functional mobility training;DME  Instruction;Therapeutic activities;Therapeutic exercise;Balance training;Neuromuscular re-education;Patient/family education;Manual techniques   PT Next Visit Plan review HEP and goals; functional strengthening, core activation, activity as tolerated    PT Home Exercise Plan given    Consulted and Agree with Plan of Care Patient          G-Codes - 2014/11/26 1418    Functional Assessment Tool Used Per FOTO 38% limited    Functional Limitation Mobility: Walking and moving around   Mobility: Walking and Moving Around Current Status [id number]) At least 20 percent but less than 40 percent impaired, limited or restricted   Mobility: Walking and Moving Around Goal Status ([id number]) At least 1 percent but less than 20 percent impaired, limited or restricted       Problem List Patient Active Problem List   Diagnosis Date Noted  . Diverticulitis 10/19/2013  . Tobacco abuse 10/19/2013  . Obesity 10/19/2013  . Diabetes 10/19/2013  . Sinus tachycardia 10/19/2013  . Leukocytosis 10/19/2013  . Hypertension   . Hyperthyroidism   . Anemia   . GERD (gastroesophageal reflux disease)   . COPD (chronic obstructive pulmonary disease)     Deniece Ree PT, DPT [place], [place], [address] Phone: [phone]   Fax:  [phone]

## 2014-11-05 NOTE — Patient Instructions (Signed)
   WALKER HIP ABDUCTION - FWW ABDUCTIONS  While standing up using a walker, raise your leg out to the side. Keep your knee straight and maintain your toes pointed forward the entire time.   Use your arms for support and balance and have a chair behind you for safety.  Repeat 5  times, 2x/day.    STANDING MARCHING  While standing, draw up your knee, set it down and then alternate to your other side.  Use your arms for support if needed for balance and safety.   Repeat 5 times each leg, 2x/day.     LONG ARC QUAD - LAQ - HIGH SEAT  While seated with your knee in a bent position, slowly straighten your knee as you raise your foot upwards as shown.  Hold for 1-2 seconds, and then let it back down.  Repeat 5 time each side, 2x/day.  TUMMY SQUEEZES  You can do this exercise laying down/sitting up/standing- whatever is most comfortable for you. Squeeze your tummy muscles and hold for 2 seconds, then relax. It should feel like you are sucking your belly button to your spine. Repeat 100 times per day- however you can break this down however you would like, for example: 10 sets of 10, 5 sets of 20, etc.

## 2014-11-13 ENCOUNTER — Encounter (HOSPITAL_COMMUNITY): Payer: Medicare Other

## 2014-11-16 ENCOUNTER — Ambulatory Visit (HOSPITAL_COMMUNITY): Payer: Medicare Other | Admitting: Physical Therapy

## 2014-11-16 DIAGNOSIS — M5441 Lumbago with sciatica, right side: Secondary | ICD-10-CM | POA: Diagnosis not present

## 2014-11-16 DIAGNOSIS — R293 Abnormal posture: Secondary | ICD-10-CM

## 2014-11-16 DIAGNOSIS — R198 Other specified symptoms and signs involving the digestive system and abdomen: Secondary | ICD-10-CM

## 2014-11-16 DIAGNOSIS — M6281 Muscle weakness (generalized): Secondary | ICD-10-CM

## 2014-11-16 NOTE — Therapy (Signed)
[place], [place], [address] Phone: [phone]   Fax:  [phone]  Physical Therapy Treatment  Patient Details  Name: Stephanie [name] MRN: [id number] Date of Birth: 1950-08-21 Referring Provider:  Leisa Lenz, MD  Encounter Date: 11/16/2014      PT End of Session - 11/16/14 1545    Visit Number 2   Number of Visits 16   Date for PT Re-Evaluation 12/03/14   Authorization Type Medicare/Medicaid    Authorization Time Period 11/05/14 to 01/05/15   Authorization - Visit Number 2   Authorization - Number of Visits 10   PT Start Time 1324   PT Stop Time 1515   PT Time Calculation (min) 44 min   Activity Tolerance Patient tolerated treatment well   Behavior During Therapy [facility] for tasks assessed/performed      Past Medical History  Diagnosis Date  . Diabetes mellitus   . Hypertension   . Fibromyalgia   . Asthma   . PONV (postoperative nausea and vomiting)   . Hyperthyroidism   . Anemia   . H/O hiatal hernia   . GERD (gastroesophageal reflux disease)   . COPD (chronic obstructive pulmonary disease)     Past Surgical History  Procedure Laterality Date  . Cholecystectomy    . Abdominal hysterectomy  partial  . Carpal tunnel release Right 1991  . Wrist ganglion excision Left   . Cataract extraction w/phaco Right 05/08/2013    Procedure: CATARACT EXTRACTION PHACO AND INTRAOCULAR LENS PLACEMENT (IOC);  Surgeon: Tonny Branch, MD;  Location: [facility];  Service: Ophthalmology;  Laterality: Right;  CDE 10.31  . Cataract extraction w/phaco Left 08/17/2013    Procedure: CATARACT EXTRACTION PHACO AND INTRAOCULAR LENS PLACEMENT (IOC);  Surgeon: Tonny Branch, MD;  Location: [facility];  Service: Ophthalmology;  Laterality: Left;  CDE:9.03  . Dental surgery      There were no vitals filed for this visit.  Visit Diagnosis:  Midline low back pain with right-sided sciatica  Poor posture  Muscle weakness  (generalized)  Abdominal weakness      Subjective Assessment - 11/16/14 1436    Subjective Pt reports that she has been having a lot of back pain today, she states that she has been having some back spasms today.    Currently in Pain? Yes   Pain Score 9    Pain Location Back                 [facility] Adult PT Treatment/Exercise - 11/16/14 0001    Exercises   Exercises Lumbar   Lumbar Exercises: Stretches   Single [name] to Chest Stretch 10 seconds;5 reps   Lower Trunk Rotation Limitations 10 reps, 5 seconds   Lumbar Exercises: Standing   Heel Raises 15 reps   Other Standing Lumbar Exercises standing hip abduction and extension x 10    Lumbar Exercises: Supine   Ab Set 10 reps;3 seconds   AB Set Limitations abdominal bracing   Clam 15 reps   Clam Limitations green tband   Bridge 10 reps   Straight Leg Raise 10 reps   Modalities   Modalities Moist Heat   Moist Heat Therapy   Number Minutes Moist Heat 10 Minutes  while completing supine therex   Moist Heat Location Lumbar Spine   Manual Therapy   Manual Therapy Soft tissue mobilization   Soft tissue mobilization STM to bilateral lumbar paraspinals, QL  PT Education - 11/16/14 1545    Education provided Yes   Education Details HEP reviewed   Person(s) Educated Patient   Methods Explanation;Demonstration   Comprehension Verbalized understanding;Returned demonstration          PT Short Term Goals - 11/05/14 1409    PT SHORT TERM GOAL #1   Title Patient will experience no more than 4/10 pain in her low back at all times with functional tasks and activities    Time 4   Period Weeks   Status New   PT SHORT TERM GOAL #2   Title Patient will demonstrate an improvement of at least 15 degrees in lumbar flexion and lateral flexion, and at least 5 degrees in lumbar extension, no increase in pain    Time 4   Period Weeks   Status New   PT SHORT TERM GOAL #3   Title Patient will be able to  ambulate at least 30 minutes with her cane and pain no more than 4/10, no rest breaks    Time 4   Period Weeks   Status New   PT SHORT TERM GOAL #4   Title Patient will be able to verbalize the importance of correct posture, and will demonstrate the ablity to maintain correct posture in functional scenarios with cues no more than 20% of the time    PT SHORT TERM GOAL #5   Title Patient to be independent in correctly and consistently performing appropriate HEP, to be updated PRN    Time 4   Period Weeks   Status New           PT Long Term Goals - 11/05/14 1413    PT LONG TERM GOAL #1   Title Patient will demonstrate an increase of at least 1.5 strength grades in all tested muscles in bilateral LEs, proximal muscles, and core strength    Time 8   Period Weeks   Status New   PT LONG TERM GOAL #2   Title Patient will experience no more than 3/10 pain in her low back during all functional activities and scenarios of at least 90 minutes in duration   Time 8   Period Weeks   Status New   PT LONG TERM GOAL #3   Title Patient will consistently demonstrate correct lifting mechanics when picking up items from floor, of no more than 5# in weight, with no cues and on an independent basis    Time 8   Period Weeks   Status New   PT LONG TERM GOAL #4   Title Patient to be independent in corrrectly and consistently performing appropriate advanced HEP, including functional stretching techniques that will not exacerbate her pain    Time 8   Period Weeks   Status New               Plan - 11/16/14 1545    Clinical Impression Statement Pt's goals and HEP were reviewed today. Treatment session focused on functional strengthening and decreasing pain levels. Pt completed all supine and standing therex today with minimal c/o pain during exercise. Manual therapy was performed following therex to relax tight lumbar musculature and decrease pain. Pt reported decreased pain post treatment, rating  pain at 6/10.    PT Next Visit Plan Manual PRN, continue with core strengthening and functional stretching        Problem List Patient Active Problem List   Diagnosis Date Noted  . Diverticulitis 10/19/2013  . Tobacco abuse 10/19/2013  .  Obesity 10/19/2013  . Diabetes 10/19/2013  . Sinus tachycardia 10/19/2013  . Leukocytosis 10/19/2013  . Hypertension   . Hyperthyroidism   . Anemia   . GERD (gastroesophageal reflux disease)   . COPD (chronic obstructive pulmonary disease)     Hilma Favors, PT, DPT [phone] 11/16/2014, 3:47 PM  [place] [address] [place], [place], [address] Phone: [phone]   Fax:  [phone]

## 2014-11-19 ENCOUNTER — Ambulatory Visit (HOSPITAL_COMMUNITY): Payer: Medicare Other | Attending: Preventative Medicine | Admitting: Physical Therapy

## 2014-11-19 DIAGNOSIS — R293 Abnormal posture: Secondary | ICD-10-CM | POA: Diagnosis present

## 2014-11-19 DIAGNOSIS — R198 Other specified symptoms and signs involving the digestive system and abdomen: Secondary | ICD-10-CM

## 2014-11-19 DIAGNOSIS — M5441 Lumbago with sciatica, right side: Secondary | ICD-10-CM

## 2014-11-19 DIAGNOSIS — M6281 Muscle weakness (generalized): Secondary | ICD-10-CM | POA: Diagnosis present

## 2014-11-19 NOTE — Therapy (Addendum)
[place], [place], [address] Phone: [phone]   Fax:  [phone]  Physical Therapy Treatment  Patient Details  Name: Stephanie [name] MRN: [id number] Date of Birth: November 01, 1950 Referring Provider:  Leisa Lenz, MD  Encounter Date: 11/19/2014      PT End of Session - 11/19/14 1436    Visit Number 3   Number of Visits 16   Date for PT Re-Evaluation 12/03/14   Authorization Type Medicare/Medicaid    Authorization Time Period 11/05/14 to 01/05/15   Authorization - Visit Number 3   Authorization - Number of Visits 10   PT Start Time 9417   PT Stop Time 1427   PT Time Calculation (min) 42 min   Activity Tolerance Patient tolerated treatment well   Behavior During Therapy [facility] for tasks assessed/performed      Past Medical History  Diagnosis Date  . Diabetes mellitus   . Hypertension   . Fibromyalgia   . Asthma   . PONV (postoperative nausea and vomiting)   . Hyperthyroidism   . Anemia   . H/O hiatal hernia   . GERD (gastroesophageal reflux disease)   . COPD (chronic obstructive pulmonary disease)     Past Surgical History  Procedure Laterality Date  . Cholecystectomy    . Abdominal hysterectomy  partial  . Carpal tunnel release Right 1991  . Wrist ganglion excision Left   . Cataract extraction w/phaco Right 05/08/2013    Procedure: CATARACT EXTRACTION PHACO AND INTRAOCULAR LENS PLACEMENT (IOC);  Surgeon: Tonny Branch, MD;  Location: [facility];  Service: Ophthalmology;  Laterality: Right;  CDE 10.31  . Cataract extraction w/phaco Left 08/17/2013    Procedure: CATARACT EXTRACTION PHACO AND INTRAOCULAR LENS PLACEMENT (IOC);  Surgeon: Tonny Branch, MD;  Location: [facility];  Service: Ophthalmology;  Laterality: Left;  CDE:9.03  . Dental surgery      There were no vitals filed for this visit.  Visit Diagnosis:  Midline low back pain with right-sided sciatica  Poor posture  Muscle weakness  (generalized)  Abdominal weakness      Subjective Assessment - 11/19/14 1351    Subjective Pt reports that she had a lot of back pain from Saturday evening until this morning, but her pain has eased up since this morning.    Currently in Pain? Yes   Pain Score 4    Pain Location Back                         [facility] Adult PT Treatment/Exercise - 11/19/14 0001    Exercises   Exercises Lumbar   Lumbar Exercises: Stretches   Single Knee to Chest Stretch 10 seconds;5 reps   Lower Trunk Rotation Limitations 10 reps   Lumbar Exercises: Standing   Heel Raises 15 reps   Forward Lunge 10 reps   Forward Lunge Limitations 6" step   Side Lunge 10 reps  6" step   Other Standing Lumbar Exercises standing hip extension with trunk flexed x 10, hip abduction x 10   Lumbar Exercises: Seated   Other Seated Lumbar Exercises seated swiss ball roll out stretch 10"x5   Lumbar Exercises: Supine   Ab Set 10 reps;3 seconds   AB Set Limitations abdominal bracing   Bent Knee Raise 10 reps   Bridge 15 reps   Straight Leg Raise 10 reps   Lumbar Exercises: Sidelying   Clam 15 reps  PT Short Term Goals - 11/05/14 1409    PT SHORT TERM GOAL #1   Title Patient will experience no more than 4/10 pain in her low back at all times with functional tasks and activities    Time 4   Period Weeks   Status New   PT SHORT TERM GOAL #2   Title Patient will demonstrate an improvement of at least 15 degrees in lumbar flexion and lateral flexion, and at least 5 degrees in lumbar extension, no increase in pain    Time 4   Period Weeks   Status New   PT SHORT TERM GOAL #3   Title Patient will be able to ambulate at least 30 minutes with her cane and pain no more than 4/10, no rest breaks    Time 4   Period Weeks   Status New   PT SHORT TERM GOAL #4   Title Patient will be able to verbalize the importance of correct posture, and will demonstrate the ablity to maintain  correct posture in functional scenarios with cues no more than 20% of the time    PT SHORT TERM GOAL #5   Title Patient to be independent in correctly and consistently performing appropriate HEP, to be updated PRN    Time 4   Period Weeks   Status New           PT Long Term Goals - 11/05/14 1413    PT LONG TERM GOAL #1   Title Patient will demonstrate an increase of at least 1.5 strength grades in all tested muscles in bilateral LEs, proximal muscles, and core strength    Time 8   Period Weeks   Status New   PT LONG TERM GOAL #2   Title Patient will experience no more than 3/10 pain in her low back during all functional activities and scenarios of at least 90 minutes in duration   Time 8   Period Weeks   Status New   PT LONG TERM GOAL #3   Title Patient will consistently demonstrate correct lifting mechanics when picking up items from floor, of no more than 5# in weight, with no cues and on an independent basis    Time 8   Period Weeks   Status New   PT LONG TERM GOAL #4   Title Patient to be independent in corrrectly and consistently performing appropriate advanced HEP, including functional stretching techniques that will not exacerbate her pain    Time 8   Period Weeks   Status New               Plan - 11/19/14 1437    Clinical Impression Statement Pt presented with decreased pain levels today as compared to last treatment. Pt was able to tolerate progression of core strengtheninig with bent knee raise, clams, and forward and side lunges without any c/o increased pain. Pt reported that she had some feelings of tightness in her L side post treatment, but denied any increased pain. Pt will benefit from continued supine and standing core strengthening to decrease LBP.     PT Next Visit Plan Continue with manual PRN, progress core strengthening and hip strengthening.         Problem List Patient Active Problem List   Diagnosis Date Noted  . Diverticulitis  10/19/2013  . Tobacco abuse 10/19/2013  . Obesity 10/19/2013  . Diabetes ([place]) 10/19/2013  . Sinus tachycardia ([place]) 10/19/2013  . Leukocytosis 10/19/2013  . Hypertension   .  Hyperthyroidism   . Anemia   . GERD (gastroesophageal reflux disease)   . COPD (chronic obstructive pulmonary disease) ([place])      PHYSICAL THERAPY DISCHARGE SUMMARY  Visits from Start of Care: 3  Current functional level related to goals / functional outcomes: Pt was seen for 3 visits in PT, then called to report that Medicaid would not cover her visits and she wished to discontinue PT. Unable to assess current functional level.    Remaining deficits: Unable to assess   Education / Equipment: N/A  Plan: Patient agrees to discharge.  Patient goals were not met. Patient is being discharged due to not returning since the last visit.  ?????         Hilma Favors, PT, DPT [phone] 11/19/2014, 2:40 PM  [place], [place], [address] Phone: [phone]   Fax:  [phone]

## 2014-11-23 ENCOUNTER — Ambulatory Visit (HOSPITAL_COMMUNITY): Payer: Medicare Other

## 2014-11-26 ENCOUNTER — Ambulatory Visit (HOSPITAL_COMMUNITY): Payer: Medicare Other | Admitting: Physical Therapy

## 2014-11-30 ENCOUNTER — Encounter (HOSPITAL_COMMUNITY): Payer: BC Managed Care – PPO | Admitting: Physical Therapy

## 2015-02-08 ENCOUNTER — Emergency Department (HOSPITAL_COMMUNITY): Payer: Medicare Other

## 2015-02-08 ENCOUNTER — Emergency Department (HOSPITAL_COMMUNITY)
Admission: EM | Admit: 2015-02-08 | Discharge: 2015-02-08 | Disposition: A | Discharge status: Home / Self Care | Payer: Medicare Other | Attending: Emergency Medicine | Admitting: Emergency Medicine

## 2015-02-08 ENCOUNTER — Encounter (HOSPITAL_COMMUNITY): Payer: Self-pay | Admitting: Emergency Medicine

## 2015-02-08 DIAGNOSIS — E119 Type 2 diabetes mellitus without complications: Secondary | ICD-10-CM | POA: Diagnosis not present

## 2015-02-08 DIAGNOSIS — Z79899 Other long term (current) drug therapy: Secondary | ICD-10-CM | POA: Insufficient documentation

## 2015-02-08 DIAGNOSIS — S60222A Contusion of left hand, initial encounter: Secondary | ICD-10-CM | POA: Diagnosis not present

## 2015-02-08 DIAGNOSIS — S8992XA Unspecified injury of left lower leg, initial encounter: Secondary | ICD-10-CM | POA: Diagnosis present

## 2015-02-08 DIAGNOSIS — Y9389 Activity, other specified: Secondary | ICD-10-CM | POA: Diagnosis not present

## 2015-02-08 DIAGNOSIS — I10 Essential (primary) hypertension: Secondary | ICD-10-CM | POA: Diagnosis not present

## 2015-02-08 DIAGNOSIS — E0590 Thyrotoxicosis, unspecified without thyrotoxic crisis or storm: Secondary | ICD-10-CM | POA: Diagnosis not present

## 2015-02-08 DIAGNOSIS — M797 Fibromyalgia: Secondary | ICD-10-CM | POA: Insufficient documentation

## 2015-02-08 DIAGNOSIS — Y999 Unspecified external cause status: Secondary | ICD-10-CM | POA: Insufficient documentation

## 2015-02-08 DIAGNOSIS — Z7982 Long term (current) use of aspirin: Secondary | ICD-10-CM | POA: Insufficient documentation

## 2015-02-08 DIAGNOSIS — Z880 Allergy status to penicillin: Secondary | ICD-10-CM | POA: Insufficient documentation

## 2015-02-08 DIAGNOSIS — K219 Gastro-esophageal reflux disease without esophagitis: Secondary | ICD-10-CM | POA: Insufficient documentation

## 2015-02-08 DIAGNOSIS — W1839XA Other fall on same level, initial encounter: Secondary | ICD-10-CM | POA: Insufficient documentation

## 2015-02-08 DIAGNOSIS — S8002XA Contusion of left knee, initial encounter: Secondary | ICD-10-CM

## 2015-02-08 DIAGNOSIS — Z794 Long term (current) use of insulin: Secondary | ICD-10-CM | POA: Diagnosis not present

## 2015-02-08 DIAGNOSIS — J449 Chronic obstructive pulmonary disease, unspecified: Secondary | ICD-10-CM | POA: Insufficient documentation

## 2015-02-08 DIAGNOSIS — D649 Anemia, unspecified: Secondary | ICD-10-CM | POA: Insufficient documentation

## 2015-02-08 DIAGNOSIS — F17210 Nicotine dependence, cigarettes, uncomplicated: Secondary | ICD-10-CM | POA: Insufficient documentation

## 2015-02-08 DIAGNOSIS — Y9289 Other specified places as the place of occurrence of the external cause: Secondary | ICD-10-CM | POA: Insufficient documentation

## 2015-02-08 MED ORDER — HYDROCODONE-ACETAMINOPHEN 5-325 MG PO TABS: 1.0000 | ORAL_TABLET | ORAL | Status: DC | PRN

## 2015-02-08 MED ORDER — ONDANSETRON HCL 4 MG PO TABS
4.0000 mg | ORAL_TABLET | Freq: Once | ORAL | Status: AC
  Administered 2015-02-08: 4 mg via ORAL
  Filled 2015-02-08: qty 1

## 2015-02-08 MED ORDER — MORPHINE SULFATE (PF) 4 MG/ML IV SOLN
6.0000 mg | Freq: Once | INTRAVENOUS | Status: AC
Start: 2015-02-08 — End: 2015-02-08
  Administered 2015-02-08: 6 mg via INTRAMUSCULAR
  Filled 2015-02-08: qty 2

## 2015-02-08 MED ORDER — DEXAMETHASONE SODIUM PHOSPHATE 4 MG/ML IJ SOLN
8.0000 mg | Freq: Once | INTRAMUSCULAR | Status: AC
  Administered 2015-02-08: 8 mg via INTRAMUSCULAR
  Filled 2015-02-08: qty 2

## 2015-02-08 NOTE — ED Notes (Signed)
Pt reports her LT knee gave out approx 30 min ago. Pt states she fell. Reports pain to LT knee, LT hand, RT shin. Pt also reports biting the inside of her mouth. Denies LOC.

## 2015-02-08 NOTE — ED Provider Notes (Signed)
CSN: [id number]     Arrival date & time 02/08/15  1341 History   First MD Initiated Contact with Patient 02/08/15 1521     Chief Complaint  Patient presents with  . Fall     (Consider location/radiation/quality/duration/timing/severity/associated sxs/prior Treatment) HPI Comments: Patient is a 64 year old female who presents to the emergency department with a complaint of knee pain following a fall.  The patient states that she was in a standing position when she lost her footing and fell. She injured her left knee and her left hand. Patient also states that she bit the inside of her lip. She denies any loss of consciousness. She has pain but can ambulate with a cane. Patient denies being on any anticoagulation medications. She denies any history of bleeding disorders. Patient denies hitting her head, and denies any loss of consciousness.  Patient is a 64 y.o. female presenting with fall. The history is provided by the patient.  Fall This is a new problem. The current episode started today. Associated symptoms include arthralgias.    Past Medical History  Diagnosis Date  . Diabetes mellitus   . Hypertension   . Fibromyalgia   . Asthma   . PONV (postoperative nausea and vomiting)   . Hyperthyroidism   . Anemia   . H/O hiatal hernia   . GERD (gastroesophageal reflux disease)   . COPD (chronic obstructive pulmonary disease) [facility])    Past Surgical History  Procedure Laterality Date  . Cholecystectomy    . Abdominal hysterectomy  partial  . Carpal tunnel release Right 1991  . Wrist ganglion excision Left   . Cataract extraction w/phaco Right 05/08/2013    Procedure: CATARACT EXTRACTION PHACO AND INTRAOCULAR LENS PLACEMENT (IOC);  Surgeon: Tonny Branch, MD;  Location: [facility];  Service: Ophthalmology;  Laterality: Right;  CDE 10.31  . Cataract extraction w/phaco Left 08/17/2013    Procedure: CATARACT EXTRACTION PHACO AND INTRAOCULAR LENS PLACEMENT (IOC);  Surgeon: Tonny Branch, MD;   Location: [facility];  Service: Ophthalmology;  Laterality: Left;  CDE:9.03  . Dental surgery     No family history on file. Social History  Substance Use Topics  . Smoking status: Current Some Day Smoker -- 0.25 packs/day for 30 years    Types: Cigarettes  . Smokeless tobacco: Never Used     Comment: some day smoker  . Alcohol Use: No   OB History    No data available     Review of Systems  Musculoskeletal: Positive for arthralgias.  All other systems reviewed and are negative.     Allergies  Tetracyclines & related; Banana; and Penicillins  Home Medications   Prior to Admission medications   Medication Sig Start Date End Date Taking? Authorizing Provider  albuterol (PROVENTIL HFA;VENTOLIN HFA) 108 (90 BASE) MCG/ACT inhaler Inhale 2 puffs into the lungs every 6 (six) hours as needed for wheezing or shortness of breath.   Yes Historical Provider, MD  aspirin EC 81 MG tablet Take 81 mg by mouth at bedtime.    Yes Historical Provider, MD  B[place], Vaccinium myrtillus, 100 MG CAPS Take 1 capsule by mouth 2 (two) times daily.   Yes Historical Provider, MD  Cholecalciferol (VITAMIN D) 1000 UNITS capsule Take 1,000 Units by mouth 2 (two) times daily.    Yes Historical Provider, MD  Cyanocobalamin (VITAMIN B-12 PO) Take 1 tablet by mouth daily.     Yes Historical Provider, MD  furosemide (LASIX) 40 MG tablet Take 0.5 tablets (20 mg total) by  mouth 2 (two) times daily. Patient taking differently: Take 40 mg by mouth daily as needed for fluid.  06/25/14  Yes Lendon Colonel, NP  insulin aspart (NOVOLOG FLEXPEN) 100 UNIT/ML FlexPen Inject 22 Units into the skin 3 (three) times daily with meals.    Yes Historical Provider, MD  Insulin Detemir (LEVEMIR FLEXPEN) 100 UNIT/ML Pen Inject 60 Units into the skin 2 (two) times daily.    Yes Historical Provider, MD  levothyroxine (SYNTHROID, LEVOTHROID) 100 MCG tablet Take 100 mcg by mouth daily before breakfast.  11/23/13  Yes Historical Provider,  MD  lisinopril (PRINIVIL,ZESTRIL) 10 MG tablet Take 10 mg by mouth daily.   Yes Historical Provider, MD  Melatonin 5 MG CAPS Take 2 capsules by mouth at bedtime.   Yes Historical Provider, MD  metoprolol (LOPRESSOR) 100 MG tablet Take 1 tablet (100 mg total) by mouth 2 (two) times daily. Take with 25 mg tablet for total dosage of 125mg . 06/25/14  Yes Lendon Colonel, NP  metoprolol tartrate (LOPRESSOR) 25 MG tablet Take 1 tablet (25 mg total) by mouth 2 (two) times daily. Take with 100 mg tablet for total dosage of 125 mg. 06/25/14  Yes Lendon Colonel, NP  pantoprazole (PROTONIX) 20 MG tablet Take 40 mg by mouth daily.    Yes Historical Provider, MD  pregabalin (LYRICA) 75 MG capsule Take 75 mg by mouth 2 (two) times daily.   Yes Historical Provider, MD  sitaGLIPtin-metformin (JANUMET) 50-1000 MG tablet Take 1 tablet by mouth 2 (two) times daily with a meal.   Yes Historical Provider, MD  vitamin C (ASCORBIC ACID) 500 MG tablet Take 1,000 mg by mouth daily.   Yes Historical Provider, MD  vitamin E 400 UNIT capsule Take 400 Units by mouth daily.   Yes Historical Provider, MD   BP 163/86 mmHg  Pulse 102  Temp(Src) 98.4 F (36.9 C) (Oral)  Resp 18  Ht 5\' 8"  (1.727 m)  Wt 102.059 kg  BMI 34.22 kg/m2  SpO2 100% Physical Exam  Constitutional: She is oriented to person, place, and time. She appears well-developed and well-nourished.  Non-toxic appearance.  HENT:  Head: Normocephalic.  Right Ear: Tympanic membrane and external ear normal.  Left Ear: Tympanic membrane and external ear normal.  Eyes: EOM and lids are normal. Pupils are equal, round, and reactive to light.  Neck: Normal range of motion. Neck supple. Carotid bruit is not present.  Cardiovascular: Normal rate, regular rhythm, normal heart sounds, intact distal pulses and normal pulses.   Pulmonary/Chest: Breath sounds normal. No respiratory distress.  Abdominal: Soft. Bowel sounds are normal. There is no tenderness. There is no  guarding.  Musculoskeletal: Normal range of motion. She exhibits tenderness.  There is pain to palpation of the left knee. There is diffuse soreness involving the anterior knee. No posterior mass or tenderness. The Achilles tendon is intact. The dorsalis pedis pulses 2+. There is pain of the left first toe.  There is a blister in the center of the left hand. There is no palpable deformity. Capillary refill is less than 2 seconds. Radial pulses are 2+ bilaterally. Patient can flex and extend the left knee.  Lymphadenopathy:       Head (right side): No submandibular adenopathy present.       Head (left side): No submandibular adenopathy present.    She has no cervical adenopathy.  Neurological: She is alert and oriented to person, place, and time. She has normal strength. No cranial nerve deficit  or sensory deficit.  Skin: Skin is warm and dry.  Psychiatric: She has a normal mood and affect. Her speech is normal.  Nursing note and vitals reviewed.   ED Course  Procedures (including critical care time) Labs Review Labs Reviewed - No data to display  Imaging Review Dg Knee Complete 4 Views Left  02/08/2015  CLINICAL DATA:  61 female with left knee pain after falling EXAM: LEFT KNEE - COMPLETE 4+ VIEW COMPARISON:  None. FINDINGS: No evidence of acute fracture or malalignment. No knee joint effusion. Advanced degenerative osteoarthritis with prominent productive osteophytes in the medial, lateral and patellofemoral compartments. Incidentally, the patella is somewhat high riding. IMPRESSION: 1. High riding patella (patella [place]). Recommend clinical correlation for evidence of patellar ligament injury. 2. Otherwise, no acute fracture or malalignment. 3. Tricompartmental degenerative arthritis with prominent productive osteophyte formation. Electronically Signed   By: Jacqulynn Cadet M.D.   On: 02/08/2015 14:42   Dg Hand Complete Left  02/08/2015  CLINICAL DATA:  Left hand pain status post fall.  EXAM: LEFT HAND - COMPLETE 3+ VIEW COMPARISON:  None. FINDINGS: There is no evidence of fracture or dislocation. There is no evidence of significant other focal bone abnormality. Mild multilevel osteoarthritic changes are seen of the interphalangeal joints. Soft tissues are unremarkable. IMPRESSION: No acute fracture or dislocation identified about the left hand. Mild multilevel interphalangeal joint osteoarthritic changes. Electronically Signed   By: Fidela Salisbury M.D.   On: 02/08/2015 14:43   I have personally reviewed and evaluated these images and lab results as part of my medical decision-making.   EKG Interpretation None      MDM  X-ray of the left knee reveals a high riding patella, but no acute fracture noted. There is tricompartmental degenerative arthritis noted in the left knee. X-ray of the left hand is negative for acute fracture or disc location there are multiple areas of osteoarthritis present. Vital signs within normal limits. The patient will be treated with Norco for pain. She is to follow with the orthopedic specialist, and/or her physician for additional evaluation and for management of this issue.    Final diagnoses:  None    *I have reviewed nursing notes, vital signs, and all appropriate lab and imaging results for this patient.[address], PA-C 02/08/15 1648  Milton Ferguson, MD 02/11/15 [phone]

## 2015-02-08 NOTE — Discharge Instructions (Signed)
The x-ray of your knee shows advanced arthritis, but no fracture, and no dislocation. Please use the knee immobilizer over the next 3-5 days. You do not have to sleep in this device. Heating pad to your knee may be helpful. Use Tylenol for mild pain, use Norco for more severe pain. Norco may cause drowsiness, please use this medication with caution. Contusion A contusion is a deep bruise. Contusions happen when an injury causes bleeding under the skin. Symptoms of bruising include pain, swelling, and discolored skin. The skin may turn blue, purple, or yellow. HOME CARE   Rest the injured area.  If told, put ice on the injured area.  Put ice in a plastic bag.  Place a towel between your skin and the bag.  Leave the ice on for 20 minutes, 2-3 times per day.  If told, put light pressure (compression) on the injured area using an elastic bandage. Make sure the bandage is not too tight. Remove it and put it back on as told by your doctor.  If possible, raise (elevate) the injured area above the level of your heart while you are sitting or lying down.  Take over-the-counter and prescription medicines only as told by your doctor. GET HELP IF:  Your symptoms do not get better after several days of treatment.  Your symptoms get worse.  You have trouble moving the injured area. GET HELP RIGHT AWAY IF:   You have very bad pain.  You have a loss of feeling (numbness) in a hand or foot.  Your hand or foot turns pale or cold.   This information is not intended to replace advice given to you by your health care provider. Make sure you discuss any questions you have with your health care provider.   Document Released: 07/22/2007 Document Revised: 10/24/2014 Document Reviewed: 06/20/2014 Elsevier Interactive Patient Education [employer].

## 2015-02-22 DIAGNOSIS — Z713 Dietary counseling and surveillance: Secondary | ICD-10-CM | POA: Diagnosis not present

## 2015-02-22 DIAGNOSIS — E669 Obesity, unspecified: Secondary | ICD-10-CM | POA: Diagnosis not present

## 2015-02-22 DIAGNOSIS — Z720 Tobacco use: Secondary | ICD-10-CM | POA: Diagnosis not present

## 2015-02-22 DIAGNOSIS — Z719 Counseling, unspecified: Secondary | ICD-10-CM | POA: Diagnosis not present

## 2015-02-22 DIAGNOSIS — J4520 Mild intermittent asthma, uncomplicated: Secondary | ICD-10-CM | POA: Diagnosis not present

## 2015-03-04 ENCOUNTER — Encounter: Payer: Self-pay | Admitting: Orthopedic Surgery

## 2015-03-04 ENCOUNTER — Ambulatory Visit (INDEPENDENT_AMBULATORY_CARE_PROVIDER_SITE_OTHER): Payer: Medicare Other

## 2015-03-04 ENCOUNTER — Ambulatory Visit (INDEPENDENT_AMBULATORY_CARE_PROVIDER_SITE_OTHER): Payer: Medicare Other | Admitting: Orthopedic Surgery

## 2015-03-04 VITALS — BP 135/71 | Ht 68.0 in | Wt 225.0 lb

## 2015-03-04 DIAGNOSIS — M79675 Pain in left toe(s): Secondary | ICD-10-CM

## 2015-03-04 DIAGNOSIS — M1712 Unilateral primary osteoarthritis, left knee: Secondary | ICD-10-CM | POA: Diagnosis not present

## 2015-03-04 MED ORDER — DICLOFENAC SODIUM 50 MG PO TBEC: 50.0000 mg | DELAYED_RELEASE_TABLET | Freq: Two times a day (BID) | ORAL | Status: DC

## 2015-03-04 MED ORDER — DICLOFENAC POTASSIUM 50 MG PO TABS: 50.0000 mg | ORAL_TABLET | Freq: Two times a day (BID) | ORAL | Status: DC

## 2015-03-04 NOTE — Progress Notes (Signed)
Stephanie [name] is a 65 y.o. female   HPI:  Knee Pain: 65 year old female long history of ongoing problems with her knee such as giving way symptoms and pain presents after falling and sustaining trauma to the left knee. She went to the emergency room. She had x-rays and it showed significant arthritis in a high riding patella suggesting possible patellar tendon injury.  Location of pain left knee, quality dull ache, severity mild to moderate, duration several years increased after recent trauma on December 23, timing constant context ongoing recent trauma modifying symptoms no significant interventions have been tried  Review of systems constitutional symptoms none neurologic symptoms normal  Past Medical History  Diagnosis Date  . Diabetes mellitus   . Hypertension   . Fibromyalgia   . Asthma   . PONV (postoperative nausea and vomiting)   . Hyperthyroidism   . Anemia   . H/O hiatal hernia   . GERD (gastroesophageal reflux disease)   . COPD (chronic obstructive pulmonary disease) [facility])    Past Surgical History  Procedure Laterality Date  . Cholecystectomy    . Abdominal hysterectomy  partial  . Carpal tunnel release Right 1991  . Wrist ganglion excision Left   . Cataract extraction w/phaco Right 05/08/2013    Procedure: CATARACT EXTRACTION PHACO AND INTRAOCULAR LENS PLACEMENT (IOC);  Surgeon: Tonny Branch, MD;  Location: [facility];  Service: Ophthalmology;  Laterality: Right;  CDE 10.31  . Cataract extraction w/phaco Left 08/17/2013    Procedure: CATARACT EXTRACTION PHACO AND INTRAOCULAR LENS PLACEMENT (IOC);  Surgeon: Tonny Branch, MD;  Location: [facility];  Service: Ophthalmology;  Laterality: Left;  CDE:9.03  . Dental surgery      Current outpatient prescriptions:  .  aspirin EC 81 MG tablet, Take 81 mg by mouth at bedtime. , Disp: , Rfl:  .  Cholecalciferol (VITAMIN D) 1000 UNITS capsule, Take 1,000 Units by mouth 2 (two) times daily. , Disp: , Rfl:  .  Cyanocobalamin (VITAMIN B-12 PO),  Take 1 tablet by mouth daily.  , Disp: , Rfl:  .  insulin aspart (NOVOLOG FLEXPEN) 100 UNIT/ML FlexPen, Inject 22 Units into the skin 3 (three) times daily with meals. , Disp: , Rfl:  .  Insulin Detemir (LEVEMIR FLEXPEN) 100 UNIT/ML Pen, Inject 60 Units into the skin 2 (two) times daily. , Disp: , Rfl:  .  Melatonin 5 MG CAPS, Take 2 capsules by mouth at bedtime., Disp: , Rfl:  .  vitamin C (ASCORBIC ACID) 500 MG tablet, Take 1,000 mg by mouth daily., Disp: , Rfl:  .  vitamin E 400 UNIT capsule, Take 400 Units by mouth daily., Disp: , Rfl:  .  albuterol (PROVENTIL HFA;VENTOLIN HFA) 108 (90 BASE) MCG/ACT inhaler, Inhale 2 puffs into the lungs every 6 (six) hours as needed for wheezing or shortness of breath., Disp: , Rfl:  .  Bilberry, Vaccinium myrtillus, 100 MG CAPS, Take 1 capsule by mouth 2 (two) times daily., Disp: , Rfl:  .  diclofenac (CATAFLAM) 50 MG tablet, Take 1 tablet (50 mg total) by mouth 2 (two) times daily., Disp: 60 tablet, Rfl: 5 .  furosemide (LASIX) 40 MG tablet, Take 0.5 tablets (20 mg total) by mouth 2 (two) times daily. (Patient taking differently: Take 40 mg by mouth daily as needed for fluid. ), Disp: 30 tablet, Rfl: 11 .  HYDROcodone-acetaminophen (NORCO/VICODIN) 5-325 MG tablet, Take 1 tablet by mouth every 4 (four) hours as needed., Disp: 15 tablet, Rfl: 0 .  levothyroxine (SYNTHROID,  LEVOTHROID) 100 MCG tablet, Take 100 mcg by mouth daily before breakfast. , Disp: , Rfl: 0 .  lisinopril (PRINIVIL,ZESTRIL) 10 MG tablet, Take 10 mg by mouth daily., Disp: , Rfl:  .  metoprolol (LOPRESSOR) 100 MG tablet, Take 1 tablet (100 mg total) by mouth 2 (two) times daily. Take with 25 mg tablet for total dosage of 199m., Disp: 180 tablet, Rfl: 3 .  metoprolol tartrate (LOPRESSOR) 25 MG tablet, Take 1 tablet (25 mg total) by mouth 2 (two) times daily. Take with 100 mg tablet for total dosage of 125 mg., Disp: 180 tablet, Rfl: 3 .  pantoprazole (PROTONIX) 20 MG tablet, Take 40 mg by mouth  daily. , Disp: , Rfl:  .  pregabalin (LYRICA) 75 MG capsule, Take 75 mg by mouth 2 (two) times daily., Disp: , Rfl:  .  sitaGLIPtin-metformin (JANUMET) 50-1000 MG tablet, Take 1 tablet by mouth 2 (two) times daily with a meal., Disp: , Rfl:  Allergies  Allergen Reactions  . Tetracyclines & Related Anaphylaxis  . Banana Hives and Nausea And Vomiting  . Penicillins Rash    Has patient had a PCN reaction causing immediate rash, facial/tongue/throat swelling, SOB or lightheadedness with hypotension: No Has patient had a PCN reaction causing severe rash involving mucus membranes or skin necrosis: No Has patient had a PCN reaction that required hospitalization No Has patient had a PCN reaction occurring within the last 10 years: No If all of the above answers are "NO", then may proceed with Cephalosporin use.     reports that she has been smoking Cigarettes.  She has a 7.5 pack-year smoking history. She has never used smokeless tobacco. She reports that she does not drink alcohol or use illicit drugs. No family history on file.   Exam findings  BP 135/71 mmHg  Ht 5' 8"  (1.727 m)  Wt 225 lb (102.059 kg)  BMI 34.22 kg/m2 She is alert awake and oriented 3 mood is pleasant general appearance normal amber 3 status without significant limp although she does wear brace   Knee: Left No effusion is noted in the left knee her knee flexion arc is 130 motor exams normal knee is stable sensations intact in the left leg pulses are good skin is clean dry and intact McMurray sign is negative.   Impression osteoarthritis left knee  X-ray I believe shows left knee osteoarthritis with high riding patella but patient has no extensor mechanism dysfunction  She asked to have her left foot x-rayed for great toe and second toe pain and swelling sustained at the injury in December  There appears to be apparent fracture near the IP joint of the great toe at the phalanx however this is not clear may be  arthritic lytic change may be bone spur may be previous old fracture no treatment is needed

## 2015-03-04 NOTE — Patient Instructions (Signed)
  Arthritis Arthritis means joint pain. It can also mean joint disease. A joint is a place where bones come together. People who have arthritis may have:  Red joints.  Swollen joints.  Stiff joints.  Warm joints.  A fever.  A feeling of being sick. HOME CARE Pay attention to any changes in your symptoms. Take these actions to help with your pain and swelling. Medicines  Take over-the-counter and prescription medicines only as told by your doctor.  Do not take aspirin for pain if your doctor says that you may have gout. Activities  Rest your joint if your doctor tells you to.  Avoid activities that make the pain worse.  Exercise your joint regularly as told by your doctor. Try doing exercises like:  Swimming.  Water aerobics.  Biking.  Walking. Joint Care  If your joint is swollen, keep it raised (elevated) if told by your doctor.  If your joint feels stiff in the morning, try taking a warm shower.  If you have diabetes, do not apply heat without asking your doctor.  If told, apply heat to the joint:  Put a towel between the joint and the hot pack or heating pad.  Leave the heat on the area for 20-30 minutes.  If told, apply ice to the joint:  Put ice in a plastic bag.  Place a towel between your skin and the bag.  Leave the ice on for 20 minutes, 2-3 times per day.  Keep all follow-up visits as told by your doctor. GET HELP IF:  The pain gets worse.  You have a fever. GET HELP RIGHT AWAY IF:  You have very bad pain in your joint.  You have swelling in your joint.  Your joint is red.  Many joints become painful and swollen.  You have very bad back pain.  Your leg is very weak.  You cannot control your pee (urine) or poop (stool).   This information is not intended to replace advice given to you by your health care provider. Make sure you discuss any questions you have with your health care provider.   Document Released: 04/29/2009  Document Revised: 10/24/2014 Document Reviewed: 04/30/2014 Elsevier Interactive Patient Education [employer].

## 2015-03-20 DIAGNOSIS — Z1211 Encounter for screening for malignant neoplasm of colon: Secondary | ICD-10-CM | POA: Diagnosis not present

## 2015-03-20 DIAGNOSIS — J019 Acute sinusitis, unspecified: Secondary | ICD-10-CM | POA: Diagnosis not present

## 2015-03-20 DIAGNOSIS — J209 Acute bronchitis, unspecified: Secondary | ICD-10-CM | POA: Diagnosis not present

## 2015-03-27 DIAGNOSIS — J209 Acute bronchitis, unspecified: Secondary | ICD-10-CM | POA: Diagnosis not present

## 2015-03-27 DIAGNOSIS — E669 Obesity, unspecified: Secondary | ICD-10-CM | POA: Diagnosis not present

## 2015-03-27 DIAGNOSIS — Z1211 Encounter for screening for malignant neoplasm of colon: Secondary | ICD-10-CM | POA: Diagnosis not present

## 2015-03-27 DIAGNOSIS — R05 Cough: Secondary | ICD-10-CM | POA: Diagnosis not present

## 2015-03-27 DIAGNOSIS — Z713 Dietary counseling and surveillance: Secondary | ICD-10-CM | POA: Diagnosis not present

## 2015-04-03 ENCOUNTER — Other Ambulatory Visit (HOSPITAL_COMMUNITY): Payer: Self-pay | Admitting: Nurse Practitioner

## 2015-04-03 DIAGNOSIS — Z1231 Encounter for screening mammogram for malignant neoplasm of breast: Secondary | ICD-10-CM

## 2015-04-10 DIAGNOSIS — L282 Other prurigo: Secondary | ICD-10-CM | POA: Diagnosis not present

## 2015-04-10 DIAGNOSIS — R05 Cough: Secondary | ICD-10-CM | POA: Diagnosis not present

## 2015-04-10 DIAGNOSIS — R000 Tachycardia, unspecified: Secondary | ICD-10-CM | POA: Diagnosis not present

## 2015-04-10 DIAGNOSIS — R21 Rash and other nonspecific skin eruption: Secondary | ICD-10-CM | POA: Diagnosis not present

## 2015-04-15 ENCOUNTER — Ambulatory Visit (HOSPITAL_COMMUNITY): Payer: BC Managed Care – PPO

## 2015-04-26 ENCOUNTER — Other Ambulatory Visit (HOSPITAL_COMMUNITY): Payer: Self-pay | Admitting: Internal Medicine

## 2015-04-29 ENCOUNTER — Ambulatory Visit (HOSPITAL_COMMUNITY): Payer: Medicare Other

## 2015-05-08 ENCOUNTER — Ambulatory Visit (HOSPITAL_COMMUNITY)
Admission: RE | Admit: 2015-05-08 | Discharge: 2015-05-08 | Disposition: A | Discharge status: Home / Self Care | Payer: Medicare Other | Source: Ambulatory Visit | Attending: Nurse Practitioner | Admitting: Nurse Practitioner

## 2015-05-08 ENCOUNTER — Other Ambulatory Visit (HOSPITAL_COMMUNITY): Payer: Self-pay | Admitting: Nurse Practitioner

## 2015-05-08 DIAGNOSIS — E785 Hyperlipidemia, unspecified: Secondary | ICD-10-CM | POA: Diagnosis not present

## 2015-05-08 DIAGNOSIS — Z1231 Encounter for screening mammogram for malignant neoplasm of breast: Secondary | ICD-10-CM

## 2015-05-08 DIAGNOSIS — R1011 Right upper quadrant pain: Secondary | ICD-10-CM | POA: Diagnosis not present

## 2015-05-08 DIAGNOSIS — R928 Other abnormal and inconclusive findings on diagnostic imaging of breast: Secondary | ICD-10-CM | POA: Diagnosis not present

## 2015-05-08 DIAGNOSIS — R609 Edema, unspecified: Secondary | ICD-10-CM | POA: Diagnosis not present

## 2015-05-08 DIAGNOSIS — K219 Gastro-esophageal reflux disease without esophagitis: Secondary | ICD-10-CM | POA: Diagnosis not present

## 2015-05-08 DIAGNOSIS — E1165 Type 2 diabetes mellitus with hyperglycemia: Secondary | ICD-10-CM | POA: Diagnosis not present

## 2015-05-08 DIAGNOSIS — I10 Essential (primary) hypertension: Secondary | ICD-10-CM | POA: Diagnosis not present

## 2015-05-13 ENCOUNTER — Other Ambulatory Visit: Payer: Self-pay | Admitting: Nurse Practitioner

## 2015-05-13 ENCOUNTER — Other Ambulatory Visit: Payer: Self-pay | Admitting: Student

## 2015-05-13 DIAGNOSIS — R928 Other abnormal and inconclusive findings on diagnostic imaging of breast: Secondary | ICD-10-CM

## 2015-05-15 DIAGNOSIS — L281 Prurigo nodularis: Secondary | ICD-10-CM | POA: Diagnosis not present

## 2015-05-16 ENCOUNTER — Other Ambulatory Visit (HOSPITAL_COMMUNITY): Payer: Self-pay | Admitting: Nurse Practitioner

## 2015-05-16 DIAGNOSIS — R928 Other abnormal and inconclusive findings on diagnostic imaging of breast: Secondary | ICD-10-CM

## 2015-05-21 ENCOUNTER — Ambulatory Visit (HOSPITAL_COMMUNITY)
Admission: RE | Admit: 2015-05-21 | Discharge: 2015-05-21 | Disposition: A | Discharge status: Home / Self Care | Payer: Medicare Other | Source: Ambulatory Visit | Attending: Nurse Practitioner | Admitting: Nurse Practitioner

## 2015-05-21 DIAGNOSIS — E1165 Type 2 diabetes mellitus with hyperglycemia: Secondary | ICD-10-CM | POA: Diagnosis not present

## 2015-05-21 DIAGNOSIS — E785 Hyperlipidemia, unspecified: Secondary | ICD-10-CM | POA: Diagnosis not present

## 2015-05-21 DIAGNOSIS — R609 Edema, unspecified: Secondary | ICD-10-CM | POA: Diagnosis not present

## 2015-05-21 DIAGNOSIS — R928 Other abnormal and inconclusive findings on diagnostic imaging of breast: Secondary | ICD-10-CM | POA: Diagnosis not present

## 2015-05-21 DIAGNOSIS — I10 Essential (primary) hypertension: Secondary | ICD-10-CM | POA: Diagnosis not present

## 2015-05-22 ENCOUNTER — Other Ambulatory Visit: Payer: Self-pay | Admitting: Adult Health

## 2015-05-23 DIAGNOSIS — E1151 Type 2 diabetes mellitus with diabetic peripheral angiopathy without gangrene: Secondary | ICD-10-CM | POA: Diagnosis not present

## 2015-05-23 DIAGNOSIS — E1122 Type 2 diabetes mellitus with diabetic chronic kidney disease: Secondary | ICD-10-CM | POA: Diagnosis not present

## 2015-05-23 DIAGNOSIS — E669 Obesity, unspecified: Secondary | ICD-10-CM | POA: Diagnosis not present

## 2015-05-23 DIAGNOSIS — N189 Chronic kidney disease, unspecified: Secondary | ICD-10-CM | POA: Diagnosis not present

## 2015-05-24 ENCOUNTER — Other Ambulatory Visit (HOSPITAL_COMMUNITY)
Admission: AD | Admit: 2015-05-24 | Discharge: 2015-05-24 | Disposition: A | Discharge status: Home / Self Care | Payer: Medicare Other | Source: Other Acute Inpatient Hospital | Attending: Internal Medicine | Admitting: Internal Medicine

## 2015-05-24 DIAGNOSIS — E1151 Type 2 diabetes mellitus with diabetic peripheral angiopathy without gangrene: Secondary | ICD-10-CM | POA: Diagnosis not present

## 2015-05-24 DIAGNOSIS — E1122 Type 2 diabetes mellitus with diabetic chronic kidney disease: Secondary | ICD-10-CM | POA: Insufficient documentation

## 2015-05-24 DIAGNOSIS — R809 Proteinuria, unspecified: Secondary | ICD-10-CM | POA: Insufficient documentation

## 2015-05-24 LAB — PROTEIN / CREATININE RATIO, URINE
CREATININE, URINE: 74.86 mg/dL
Protein Creatinine Ratio: 21.89 mg/mg{Cre} — ABNORMAL HIGH (ref 0.00–0.15)
Total Protein, Urine: 1639 mg/dL

## 2015-05-26 ENCOUNTER — Emergency Department (HOSPITAL_COMMUNITY)
Admission: EM | Admit: 2015-05-26 | Discharge: 2015-05-27 | Disposition: A | Discharge status: Home / Self Care | Payer: Medicare Other | Attending: Emergency Medicine | Admitting: Emergency Medicine

## 2015-05-26 ENCOUNTER — Encounter (HOSPITAL_COMMUNITY): Payer: Self-pay | Admitting: Emergency Medicine

## 2015-05-26 DIAGNOSIS — E162 Hypoglycemia, unspecified: Secondary | ICD-10-CM

## 2015-05-26 DIAGNOSIS — I10 Essential (primary) hypertension: Secondary | ICD-10-CM | POA: Insufficient documentation

## 2015-05-26 DIAGNOSIS — J449 Chronic obstructive pulmonary disease, unspecified: Secondary | ICD-10-CM | POA: Diagnosis not present

## 2015-05-26 DIAGNOSIS — J329 Chronic sinusitis, unspecified: Secondary | ICD-10-CM | POA: Insufficient documentation

## 2015-05-26 DIAGNOSIS — Z87891 Personal history of nicotine dependence: Secondary | ICD-10-CM | POA: Diagnosis not present

## 2015-05-26 DIAGNOSIS — R51 Headache: Secondary | ICD-10-CM

## 2015-05-26 DIAGNOSIS — E1165 Type 2 diabetes mellitus with hyperglycemia: Secondary | ICD-10-CM | POA: Diagnosis not present

## 2015-05-26 DIAGNOSIS — E11649 Type 2 diabetes mellitus with hypoglycemia without coma: Secondary | ICD-10-CM | POA: Diagnosis not present

## 2015-05-26 DIAGNOSIS — R1030 Lower abdominal pain, unspecified: Secondary | ICD-10-CM | POA: Diagnosis not present

## 2015-05-26 DIAGNOSIS — R519 Headache, unspecified: Secondary | ICD-10-CM

## 2015-05-26 DIAGNOSIS — J45909 Unspecified asthma, uncomplicated: Secondary | ICD-10-CM | POA: Diagnosis not present

## 2015-05-26 HISTORY — DX: Unspecified disease of inner ear, unspecified ear: H83.90

## 2015-05-26 HISTORY — DX: Irritable bowel syndrome, unspecified: K58.9

## 2015-05-26 HISTORY — DX: Tachycardia, unspecified: R00.0

## 2015-05-26 LAB — CBG MONITORING, ED: Glucose-Capillary: 210 mg/dL — ABNORMAL HIGH (ref 65–99)

## 2015-05-26 NOTE — ED Notes (Signed)
Patient states "I've had a headache off and on all day. I've also had chills and my blood sugar has been getting really low today."

## 2015-05-27 ENCOUNTER — Encounter: Payer: Self-pay | Admitting: Internal Medicine

## 2015-05-27 ENCOUNTER — Emergency Department (HOSPITAL_COMMUNITY): Payer: Medicare Other

## 2015-05-27 DIAGNOSIS — R51 Headache: Secondary | ICD-10-CM | POA: Diagnosis not present

## 2015-05-27 DIAGNOSIS — J329 Chronic sinusitis, unspecified: Secondary | ICD-10-CM | POA: Diagnosis not present

## 2015-05-27 LAB — CBC WITH DIFFERENTIAL/PLATELET
BASOS ABS: 0 10*3/uL (ref 0.0–0.1)
BASOS PCT: 0 %
Eosinophils Absolute: 1.3 10*3/uL — ABNORMAL HIGH (ref 0.0–0.7)
Eosinophils Relative: 10 %
HEMATOCRIT: 31.2 % — AB (ref 36.0–46.0)
HEMOGLOBIN: 10.3 g/dL — AB (ref 12.0–15.0)
LYMPHS PCT: 11 %
Lymphs Abs: 1.5 10*3/uL (ref 0.7–4.0)
MCH: 28.9 pg (ref 26.0–34.0)
MCHC: 33 g/dL (ref 30.0–36.0)
MCV: 87.4 fL (ref 78.0–100.0)
MONO ABS: 0.9 10*3/uL (ref 0.1–1.0)
Monocytes Relative: 7 %
NEUTROS ABS: 9.9 10*3/uL — AB (ref 1.7–7.7)
NEUTROS PCT: 72 %
Platelets: 328 10*3/uL (ref 150–400)
RBC: 3.57 MIL/uL — ABNORMAL LOW (ref 3.87–5.11)
RDW: 15.6 % — ABNORMAL HIGH (ref 11.5–15.5)
WBC: 13.8 10*3/uL — ABNORMAL HIGH (ref 4.0–10.5)

## 2015-05-27 LAB — URINALYSIS, ROUTINE W REFLEX MICROSCOPIC
GLUCOSE, UA: 250 mg/dL — AB
KETONES UR: NEGATIVE mg/dL
LEUKOCYTES UA: NEGATIVE
NITRITE: NEGATIVE
PH: 6.5 (ref 5.0–8.0)
Specific Gravity, Urine: 1.02 (ref 1.005–1.030)

## 2015-05-27 LAB — BASIC METABOLIC PANEL
ANION GAP: 9 (ref 5–15)
BUN: 22 mg/dL — ABNORMAL HIGH (ref 6–20)
CHLORIDE: 106 mmol/L (ref 101–111)
CO2: 20 mmol/L — AB (ref 22–32)
Calcium: 7.7 mg/dL — ABNORMAL LOW (ref 8.9–10.3)
Creatinine, Ser: 1.24 mg/dL — ABNORMAL HIGH (ref 0.44–1.00)
GFR calc non Af Amer: 45 mL/min — ABNORMAL LOW (ref >60)
GFR, EST AFRICAN AMERICAN: 52 mL/min — AB (ref >60)
GLUCOSE: 239 mg/dL — AB (ref 65–99)
POTASSIUM: 4 mmol/L (ref 3.5–5.1)
Sodium: 135 mmol/L (ref 135–145)

## 2015-05-27 LAB — URINE MICROSCOPIC-ADD ON

## 2015-05-27 LAB — CBG MONITORING, ED: GLUCOSE-CAPILLARY: 218 mg/dL — AB (ref 65–99)

## 2015-05-27 MED ORDER — CETIRIZINE HCL 1 MG/ML PO SYRP: 10.0000 mg | ORAL_SOLUTION | Freq: Every day | ORAL | Status: DC

## 2015-05-27 MED ORDER — CLINDAMYCIN HCL 150 MG PO CAPS
300.0000 mg | ORAL_CAPSULE | Freq: Once | ORAL | Status: AC
  Administered 2015-05-27: 300 mg via ORAL
  Filled 2015-05-27: qty 2

## 2015-05-27 MED ORDER — METOCLOPRAMIDE HCL 5 MG/ML IJ SOLN
10.0000 mg | Freq: Once | INTRAMUSCULAR | Status: AC
  Administered 2015-05-27: 10 mg via INTRAVENOUS
  Filled 2015-05-27: qty 2

## 2015-05-27 MED ORDER — DEXAMETHASONE SODIUM PHOSPHATE 10 MG/ML IJ SOLN
10.0000 mg | Freq: Once | INTRAMUSCULAR | Status: AC
  Administered 2015-05-27: 10 mg via INTRAVENOUS
  Filled 2015-05-27: qty 1

## 2015-05-27 MED ORDER — ONDANSETRON HCL 4 MG/2ML IJ SOLN
4.0000 mg | Freq: Once | INTRAMUSCULAR | Status: AC
  Administered 2015-05-27: 4 mg via INTRAVENOUS
  Filled 2015-05-27: qty 2

## 2015-05-27 MED ORDER — MAGNESIUM SULFATE 2 GM/50ML IV SOLN
2.0000 g | Freq: Once | INTRAVENOUS | Status: AC
  Administered 2015-05-27: 2 g via INTRAVENOUS
  Filled 2015-05-27: qty 50

## 2015-05-27 MED ORDER — TRAMADOL HCL 50 MG PO TABS: 50.0000 mg | ORAL_TABLET | Freq: Four times a day (QID) | ORAL | Status: DC | PRN

## 2015-05-27 MED ORDER — SODIUM CHLORIDE 0.9 % IV BOLUS (SEPSIS)
1000.0000 mL | Freq: Once | INTRAVENOUS | Status: AC
  Administered 2015-05-27: 1000 mL via INTRAVENOUS

## 2015-05-27 MED ORDER — CLINDAMYCIN HCL 300 MG PO CAPS: 300.0000 mg | ORAL_CAPSULE | Freq: Four times a day (QID) | ORAL | Status: DC

## 2015-05-27 MED ORDER — PROCHLORPERAZINE EDISYLATE 5 MG/ML IJ SOLN
10.0000 mg | Freq: Once | INTRAMUSCULAR | Status: AC
  Administered 2015-05-27: 10 mg via INTRAVENOUS
  Filled 2015-05-27: qty 2

## 2015-05-27 MED ORDER — DIPHENHYDRAMINE HCL 50 MG/ML IJ SOLN
25.0000 mg | Freq: Once | INTRAMUSCULAR | Status: AC
  Administered 2015-05-27: 25 mg via INTRAVENOUS
  Filled 2015-05-27: qty 1

## 2015-05-27 NOTE — ED Provider Notes (Signed)
CSN: [id number]     Arrival date & time 05/26/15  2301 History  By signing my name below, I, Stephanie [name], attest that this documentation has been prepared under the direction and in the presence of [name], MD at [place] [facility]. Electronically Signed: [name], ED Scribe. 05/27/2015. 12:42 AM.  Chief Complaint  Patient presents with  . Headache  . Chills  . Hypoglycemia   The history is provided by the patient. No language interpreter was used.  HPI Comments: Stephanie [name] is a 65 y.o. female with a hx of HTN, DM, fibromyalgia, COPD, and IBS who presents to the Emergency Department complaining of an waxing and waning sharp frontal headache onset 18 hours ago. Pt reports that her CBG levels dropped down to around 50 this morning. She reports that she was able to get her CBG levels back up today, but it dropped down again to 85 this evening. Pt's CBG in the ED is 210. She reports associated chills, gradually worsening productive cough with clear sputum that began yesterday, clear postnasal drip, rhinorrhea, nausea, intermittent lower abdominal pain, vomiting x1, left otalgia, and left sided sore throat. Pt reports that her headache and abdominal pain worsens with coughing. Pt states that she has been laying in bed all day because her knees are swollen and "I haven't been feeling myself today." Pt reports hx of similar headache in the past which was diagnosed as a sinus headache. Pt reports that she has lost weight and has been sick with nausea and vomiting intermittently over the past year; she believes that her CBG levels are dropping due to this weight loss. She has recently had a change in her Novolog dosage to 18 units TID, but reports that she will not take it if her CBG levels are below 100. She denies alleviating factors. She has not taken anything for her pain PTA. She denies sick contacts, fever, diarrhea, hematuria, dysuria, or frequency.   PCP Dr Izola Price Endocrinology Dr Marlou Sa  Past Medical  History  Diagnosis Date  . Diabetes mellitus   . Hypertension   . Fibromyalgia   . Asthma   . PONV (postoperative nausea and vomiting)   . Hyperthyroidism   . Anemia   . H/O hiatal hernia   . GERD (gastroesophageal reflux disease)   . COPD (chronic obstructive pulmonary disease) ([place])   . Inner ear disease   . IBS (irritable bowel syndrome)   . Tachycardia    Past Surgical History  Procedure Laterality Date  . Cholecystectomy    . Abdominal hysterectomy  partial  . Carpal tunnel release Right 1991  . Wrist ganglion excision Left   . Cataract extraction w/phaco Right 05/08/2013    Procedure: CATARACT EXTRACTION PHACO AND INTRAOCULAR LENS PLACEMENT (IOC);  Surgeon: Tonny Branch, MD;  Location: [facility];  Service: Ophthalmology;  Laterality: Right;  CDE 10.31  . Cataract extraction w/phaco Left 08/17/2013    Procedure: CATARACT EXTRACTION PHACO AND INTRAOCULAR LENS PLACEMENT (IOC);  Surgeon: Tonny Branch, MD;  Location: [facility];  Service: Ophthalmology;  Laterality: Left;  CDE:9.03  . Dental surgery     History reviewed. No pertinent family history. Social History  Substance Use Topics  . Smoking status: Former Smoker -- 0.25 packs/day for 30 years    Types: Cigarettes    Quit date: 02/18/2015  . Smokeless tobacco: Never Used     Comment: some day smoker  . Alcohol Use: No   Lives at home On disability   OB  History    No data available     Review of Systems  Constitutional: Positive for chills. Negative for fever.  HENT: Positive for ear pain, postnasal drip, rhinorrhea and sore throat.   Respiratory: Positive for cough.   Gastrointestinal: Positive for nausea, vomiting and abdominal pain. Negative for diarrhea.  Genitourinary: Negative for dysuria, frequency and hematuria.  Neurological: Positive for headaches.  All other systems reviewed and are negative.  Allergies  Tetracyclines & related; Banana; and Penicillins  Home Medications   Prior to Admission medications    Medication Sig Start Date End Date Taking? Authorizing Provider  albuterol (PROVENTIL HFA;VENTOLIN HFA) 108 (90 BASE) MCG/ACT inhaler Inhale 2 puffs into the lungs every 6 (six) hours as needed for wheezing or shortness of breath.    Historical Provider, MD  aspirin EC 81 MG tablet Take 81 mg by mouth at bedtime.     Historical Provider, MD  Bilberry, Vaccinium myrtillus, 100 MG CAPS Take 1 capsule by mouth 2 (two) times daily.    Historical Provider, MD  cetirizine (ZYRTEC) 1 MG/ML syrup Take 10 mLs (10 mg total) by mouth daily. 05/27/15   [name], MD  Cholecalciferol (VITAMIN D) 1000 UNITS capsule Take 1,000 Units by mouth 2 (two) times daily.     Historical Provider, MD  clindamycin (CLEOCIN) 300 MG capsule Take 1 capsule (300 mg total) by mouth 4 (four) times daily. 05/27/15   [name], MD  Cyanocobalamin (VITAMIN B-12 PO) Take 1 tablet by mouth daily.      Historical Provider, MD  diclofenac (CATAFLAM) 50 MG tablet Take 1 tablet (50 mg total) by mouth 2 (two) times daily. 03/04/15   Carole Civil, MD  diclofenac (VOLTAREN) 50 MG EC tablet Take 1 tablet (50 mg total) by mouth 2 (two) times daily. 03/04/15   Carole Civil, MD  furosemide (LASIX) 40 MG tablet Take 0.5 tablets (20 mg total) by mouth 2 (two) times daily. Patient taking differently: Take 40 mg by mouth daily as needed for fluid.  06/25/14   Lendon Colonel, NP  HYDROcodone-acetaminophen (NORCO/VICODIN) 5-325 MG tablet Take 1 tablet by mouth every 4 (four) hours as needed. 02/08/15   Lily Kocher, PA-C  insulin aspart (NOVOLOG FLEXPEN) 100 UNIT/ML FlexPen Inject 22 Units into the skin 3 (three) times daily with meals.     Historical Provider, MD  Insulin Detemir (LEVEMIR FLEXPEN) 100 UNIT/ML Pen Inject 60 Units into the skin 2 (two) times daily.     Historical Provider, MD  levothyroxine (SYNTHROID, LEVOTHROID) 100 MCG tablet Take 100 mcg by mouth daily before breakfast.  11/23/13   Historical Provider, MD  lisinopril  (PRINIVIL,ZESTRIL) 10 MG tablet Take 10 mg by mouth daily.    Historical Provider, MD  Melatonin 5 MG CAPS Take 2 capsules by mouth at bedtime.    Historical Provider, MD  metoprolol (LOPRESSOR) 100 MG tablet TAKE 1 TABLET BY MOUTH TWICE DAILY **TAKE WITH 25MG  TABLET FOR TOTAL DOSAGE OF 125MG ** 05/23/15   Lendon Colonel, NP  metoprolol tartrate (LOPRESSOR) 25 MG tablet TAKE 1 TABLET BY MOUTH TWICE DAILY **TAKE WITH 100MG  TABLET FOR TOTAL DOSAGE OF 125MG ** 05/23/15   Lendon Colonel, NP  pantoprazole (PROTONIX) 20 MG tablet Take 40 mg by mouth daily.     Historical Provider, MD  pregabalin (LYRICA) 75 MG capsule Take 75 mg by mouth 2 (two) times daily.    Historical Provider, MD  sitaGLIPtin-metformin (JANUMET) 50-1000 MG tablet Take 1 tablet by  mouth 2 (two) times daily with a meal.    Historical Provider, MD  traMADol (ULTRAM) 50 MG tablet Take 1 tablet (50 mg total) by mouth every 6 (six) hours as needed. 05/27/15   [name], MD  vitamin C (ASCORBIC ACID) 500 MG tablet Take 1,000 mg by mouth daily.    Historical Provider, MD  vitamin E 400 UNIT capsule Take 400 Units by mouth daily.    Historical Provider, MD   BP 148/66 mmHg  Pulse 128  Temp(Src) 98.2 F (36.8 C) (Oral)  Resp 26  Ht 5\' 7"  (1.702 m)  Wt 200 lb (90.719 kg)  BMI 31.32 kg/m2  SpO2 98%  Vital signs normal except for tachycardia  Physical Exam  Constitutional: She is oriented to person, place, and time. She appears well-developed and well-nourished.  Non-toxic appearance. She does not appear ill. No distress.  HENT:  Head: Normocephalic and atraumatic.  Right Ear: External ear normal.  Left Ear: External ear normal.  Nose: No mucosal edema or rhinorrhea. Left sinus exhibits maxillary sinus tenderness and frontal sinus tenderness.  Mouth/Throat: Oropharynx is clear and moist. Mucous membranes are dry. No dental abscesses or uvula swelling.  Left frontal sinus tenderness and mild tenderness over left maxillary sinus   Eyes: Conjunctivae and EOM are normal. Pupils are equal, round, and reactive to light.  Neck: Normal range of motion and full passive range of motion without pain. Neck supple.  Cardiovascular: Normal rate, regular rhythm and normal heart sounds.  Exam reveals no gallop and no friction rub.   No murmur heard. Pulmonary/Chest: Effort normal and breath sounds normal. No respiratory distress. She has no wheezes. She has no rhonchi. She has no rales. She exhibits no tenderness and no crepitus.  Abdominal: Soft. Normal appearance and bowel sounds are normal. She exhibits no distension. There is tenderness. There is no rebound and no guarding.  Mild lower abdominal tenderness  Musculoskeletal: Normal range of motion. She exhibits no edema or tenderness.  Moves all extremities well.   Neurological: She is alert and oriented to person, place, and time. She has normal strength. No cranial nerve deficit.  Skin: Skin is warm, dry and intact. No rash noted. No erythema. No pallor.  Psychiatric: She has a normal mood and affect. Her speech is normal and behavior is normal. Her mood appears not anxious.  Nursing note and vitals reviewed.   ED Course  Procedures (including critical care time)  Medications  clindamycin (CLEOCIN) capsule 300 mg (not administered)  sodium chloride 0.9 % bolus 1,000 mL (0 mLs Intravenous Stopped 05/27/15 0236)  sodium chloride 0.9 % bolus 1,000 mL (0 mLs Intravenous Stopped 05/27/15 0330)  metoCLOPramide (REGLAN) injection 10 mg (10 mg Intravenous Given 05/27/15 0127)  diphenhydrAMINE (BENADRYL) injection 25 mg (25 mg Intravenous Given 05/27/15 0127)  ondansetron (ZOFRAN) injection 4 mg (4 mg Intravenous Given 05/27/15 0209)  dexamethasone (DECADRON) injection 10 mg (10 mg Intravenous Given 05/27/15 0447)  magnesium sulfate IVPB 2 g 50 mL (0 g Intravenous Stopped 05/27/15 0604)  prochlorperazine (COMPAZINE) injection 10 mg (10 mg Intravenous Given 05/27/15 0447)    DIAGNOSTIC  STUDIES: Oxygen Saturation is 98% on RA, normal by my interpretation.    COORDINATION OF CARE: 12:41 AM-Discussed treatment plan which includes IV fluids and labs with pt at bedside and pt agreed to plan.   Patient continued to have complaints of pain. She was given several different pain regimens for migraine type headaches. She also continues to have nausea was  giving several different types of nausea medications. Finally since she was not improving a CT scan was done. She was noted to have diffuse sinus disease. When I checked the patient at 6 AM she states her headache is improving. She states she has seen Dr. Benjamine Mola in the past as her ENT. She was started on clindamycin for her sinus infection. She was given tramadol for pain. She is advised follow up with Dr. Benjamine Mola if she's not improving during the next several days.   Labs Review Results for orders placed or performed during the hospital encounter of 05/26/15  Urinalysis, Routine w reflex microscopic (not at [facility])  Result Value Ref Range   Color, Urine YELLOW YELLOW   APPearance CLEAR CLEAR   Specific Gravity, Urine 1.020 1.005 - 1.030   pH 6.5 5.0 - 8.0   Glucose, UA 250 (A) NEGATIVE mg/dL   Hgb urine dipstick MODERATE (A) NEGATIVE   Bilirubin Urine SMALL (A) NEGATIVE   Ketones, ur NEGATIVE NEGATIVE mg/dL   Protein, ur >300 (A) NEGATIVE mg/dL   Nitrite NEGATIVE NEGATIVE   Leukocytes, UA NEGATIVE NEGATIVE  CBC with Differential  Result Value Ref Range   WBC 13.8 (H) 4.0 - 10.5 K/uL   RBC 3.57 (L) 3.87 - 5.11 MIL/uL   Hemoglobin 10.3 (L) 12.0 - 15.0 g/dL   HCT 31.2 (L) 36.0 - 46.0 %   MCV 87.4 78.0 - 100.0 fL   MCH 28.9 26.0 - 34.0 pg   MCHC 33.0 30.0 - 36.0 g/dL   RDW 15.6 (H) 11.5 - 15.5 %   Platelets 328 150 - 400 K/uL   Neutrophils Relative % 72 %   Neutro Abs 9.9 (H) 1.7 - 7.7 K/uL   Lymphocytes Relative 11 %   Lymphs Abs 1.5 0.7 - 4.0 K/uL   Monocytes Relative 7 %   Monocytes Absolute 0.9 0.1 - 1.0 K/uL   Eosinophils  Relative 10 %   Eosinophils Absolute 1.3 (H) 0.0 - 0.7 K/uL   Basophils Relative 0 %   Basophils Absolute 0.0 0.0 - 0.1 K/uL  Basic metabolic panel  Result Value Ref Range   Sodium 135 135 - 145 mmol/L   Potassium 4.0 3.5 - 5.1 mmol/L   Chloride 106 101 - 111 mmol/L   CO2 20 (L) 22 - 32 mmol/L   Glucose, Bld 239 (H) 65 - 99 mg/dL   BUN 22 (H) 6 - 20 mg/dL   Creatinine, Ser 1.24 (H) 0.44 - 1.00 mg/dL   Calcium 7.7 (L) 8.9 - 10.3 mg/dL   GFR calc non Af Amer 45 (L) >60 mL/min   GFR calc Af Amer 52 (L) >60 mL/min   Anion gap 9 5 - 15  Urine microscopic-add on  Result Value Ref Range   Squamous Epithelial / LPF 6-30 (A) NONE SEEN   WBC, UA 0-5 0 - 5 WBC/hpf   RBC / HPF 0-5 0 - 5 RBC/hpf   Bacteria, UA MANY (A) NONE SEEN  CBG monitoring, ED  Result Value Ref Range   Glucose-Capillary 210 (H) 65 - 99 mg/dL  CBG monitoring, ED  Result Value Ref Range   Glucose-Capillary 218 (H) 65 - 99 mg/dL   Comment 1 Notify RN    Comment 2 Document in Chart    Laboratory interpretation all normal except Hyperglycemia   Imaging Review Ct Head Wo Contrast  05/27/2015  CLINICAL DATA:  Intermittent sharp frontal headache, onset 18 hours ago. Hypoglycemia this morning. EXAM: CT HEAD WITHOUT CONTRAST  TECHNIQUE: Contiguous axial images were obtained from the base of the skull through the vertex without intravenous contrast. COMPARISON:  07/13/2014 FINDINGS: There is no intracranial hemorrhage, mass or evidence of acute infarction. There is no extra-axial fluid collection. Gray matter and white matter appear normal. Cerebral volume is normal for age. Brainstem and posterior fossa are unremarkable. The CSF spaces appear normal. The bony structures are intact. There is severe paranasal sinus disease. There are air-fluid levels in both maxillary sinuses and in both sphenoid sinuses. There is opacification of many of the ethmoid air cells. Frontal sinuses are relatively clear, with only minimal membrane  thickening. No bony destruction. The paranasal sinus disease is new from 07/13/2014 and appear acute given the air-fluid levels. IMPRESSION: 1. Normal brain 2. Severe paranasal sinus disease including air-fluid levels in the maxillary and sphenoid sinuses bilaterally, as well as opacification of many of the ethmoid air cells. Electronically Signed   By: Andreas Newport M.D.   On: 05/27/2015 05:13   I have personally reviewed and evaluated these images and lab results as part of my medical decision-making.   EKG Interpretation None      MDM   Final diagnoses:  Sinusitis, unspecified chronicity, unspecified location  Headache, unspecified headache type  Hypoglycemia   New Prescriptions   CETIRIZINE (ZYRTEC) 1 MG/ML SYRUP    Take 10 mLs (10 mg total) by mouth daily.   CLINDAMYCIN (CLEOCIN) 300 MG CAPSULE    Take 1 capsule (300 mg total) by mouth 4 (four) times daily.   TRAMADOL (ULTRAM) 50 MG TABLET    Take 1 tablet (50 mg total) by mouth every 6 (six) hours as needed.    Plan discharge  [name], MD, FACEP    I personally performed the services described in this documentation, which was scribed in my presence. The recorded information has been reviewed and considered.  [name], MD, Barbette Or, MD 05/27/15 [id number]

## 2015-05-27 NOTE — Discharge Instructions (Signed)
Use heat on your face to help your sinuses drain and for comfort. Take the antibiotic until gone. Take the tramadol with acetaminophen 650 mg 4 times a day for pain. Follow up with Dr Benjamine Mola, your ENT if you aren't improving in the next couple of days or if you get a high fever. Continue to monitor your blood sugar closely, if it continues to get low you may need to lower your insulin dose even lower.    Sinusitis, Adult Sinusitis is redness, soreness, and inflammation of the paranasal sinuses. Paranasal sinuses are air pockets within the bones of your face. They are located beneath your eyes, in the middle of your forehead, and above your eyes. In healthy paranasal sinuses, mucus is able to drain out, and air is able to circulate through them by way of your nose. However, when your paranasal sinuses are inflamed, mucus and air can become trapped. This can allow bacteria and other germs to grow and cause infection. Sinusitis can develop quickly and last only a short time (acute) or continue over a long period (chronic). Sinusitis that lasts for more than 12 weeks is considered chronic. CAUSES Causes of sinusitis include:  Allergies.  Structural abnormalities, such as displacement of the cartilage that separates your nostrils (deviated septum), which can decrease the air flow through your nose and sinuses and affect sinus drainage.  Functional abnormalities, such as when the small hairs (cilia) that line your sinuses and help remove mucus do not work properly or are not present. SIGNS AND SYMPTOMS Symptoms of acute and chronic sinusitis are the same. The primary symptoms are pain and pressure around the affected sinuses. Other symptoms include:  Upper toothache.  Earache.  Headache.  Bad breath.  Decreased sense of smell and taste.  A cough, which worsens when you are lying flat.  Fatigue.  Fever.  Thick drainage from your nose, which often is green and may contain pus  (purulent).  Swelling and warmth over the affected sinuses. DIAGNOSIS Your health care provider will perform a physical exam. During your exam, your health care provider may perform any of the following to help determine if you have acute sinusitis or chronic sinusitis:  Look in your nose for signs of abnormal growths in your nostrils (nasal polyps).  Tap over the affected sinus to check for signs of infection.  View the inside of your sinuses using an imaging device that has a light attached (endoscope). If your health care provider suspects that you have chronic sinusitis, one or more of the following tests may be recommended:  Allergy tests.  Nasal culture. A sample of mucus is taken from your nose, sent to a lab, and screened for bacteria.  Nasal cytology. A sample of mucus is taken from your nose and examined by your health care provider to determine if your sinusitis is related to an allergy. TREATMENT Most cases of acute sinusitis are related to a viral infection and will resolve on their own within 10 days. Sometimes, medicines are prescribed to help relieve symptoms of both acute and chronic sinusitis. These may include pain medicines, decongestants, nasal steroid sprays, or saline sprays. However, for sinusitis related to a bacterial infection, your health care provider will prescribe antibiotic medicines. These are medicines that will help kill the bacteria causing the infection. Rarely, sinusitis is caused by a fungal infection. In these cases, your health care provider will prescribe antifungal medicine. For some cases of chronic sinusitis, surgery is needed. Generally, these are cases in  which sinusitis recurs more than 3 times per year, despite other treatments. HOME CARE INSTRUCTIONS  Drink plenty of water. Water helps thin the mucus so your sinuses can drain more easily.  Use a humidifier.  Inhale steam 3-4 times a day (for example, sit in the bathroom with the shower  running).  Apply a warm, moist washcloth to your face 3-4 times a day, or as directed by your health care provider.  Use saline nasal sprays to help moisten and clean your sinuses.  Take medicines only as directed by your health care provider.  If you were prescribed either an antibiotic or antifungal medicine, finish it all even if you start to feel better. SEEK IMMEDIATE MEDICAL CARE IF:  You have increasing pain or severe headaches.  You have nausea, vomiting, or drowsiness.  You have swelling around your face.  You have vision problems.  You have a stiff neck.  You have difficulty breathing.   This information is not intended to replace advice given to you by your health care provider. Make sure you discuss any questions you have with your health care provider.   Document Released: 02/02/2005 Document Revised: 02/23/2014 Document Reviewed: 02/17/2011 Elsevier Interactive Patient Education [employer].

## 2015-05-27 NOTE — ED Notes (Signed)
Patient with no complaints at this time. Respirations even and unlabored. Skin warm/dry. Discharge instructions reviewed with patient at this time. Patient given opportunity to voice concerns/ask questions. Patient discharged at this time and left Emergency Department with steady gait.   

## 2015-06-04 ENCOUNTER — Encounter: Payer: Self-pay | Admitting: Gastroenterology

## 2015-06-05 ENCOUNTER — Other Ambulatory Visit (HOSPITAL_COMMUNITY): Payer: Self-pay | Admitting: Internal Medicine

## 2015-06-05 DIAGNOSIS — R945 Abnormal results of liver function studies: Principal | ICD-10-CM

## 2015-06-05 DIAGNOSIS — R7989 Other specified abnormal findings of blood chemistry: Secondary | ICD-10-CM

## 2015-06-07 ENCOUNTER — Ambulatory Visit (HOSPITAL_COMMUNITY)
Admission: RE | Admit: 2015-06-07 | Discharge: 2015-06-07 | Disposition: A | Discharge status: Home / Self Care | Payer: Medicare Other | Source: Ambulatory Visit | Attending: Internal Medicine | Admitting: Internal Medicine

## 2015-06-07 DIAGNOSIS — K760 Fatty (change of) liver, not elsewhere classified: Secondary | ICD-10-CM | POA: Insufficient documentation

## 2015-06-07 DIAGNOSIS — R7989 Other specified abnormal findings of blood chemistry: Secondary | ICD-10-CM | POA: Diagnosis not present

## 2015-06-07 DIAGNOSIS — J90 Pleural effusion, not elsewhere classified: Secondary | ICD-10-CM | POA: Diagnosis not present

## 2015-06-07 DIAGNOSIS — R945 Abnormal results of liver function studies: Secondary | ICD-10-CM

## 2015-06-20 DIAGNOSIS — R609 Edema, unspecified: Secondary | ICD-10-CM | POA: Diagnosis not present

## 2015-06-20 DIAGNOSIS — M797 Fibromyalgia: Secondary | ICD-10-CM | POA: Diagnosis not present

## 2015-06-20 DIAGNOSIS — E119 Type 2 diabetes mellitus without complications: Secondary | ICD-10-CM | POA: Diagnosis not present

## 2015-06-20 DIAGNOSIS — E669 Obesity, unspecified: Secondary | ICD-10-CM | POA: Diagnosis not present

## 2015-06-21 ENCOUNTER — Ambulatory Visit: Payer: BC Managed Care – PPO | Admitting: Gastroenterology

## 2015-06-24 ENCOUNTER — Other Ambulatory Visit: Payer: Self-pay | Admitting: Adult Health

## 2015-06-25 ENCOUNTER — Ambulatory Visit: Payer: Medicare Other | Admitting: Gastroenterology

## 2015-06-26 ENCOUNTER — Ambulatory Visit: Payer: Medicare Other | Admitting: Gastroenterology

## 2015-06-26 ENCOUNTER — Other Ambulatory Visit (HOSPITAL_COMMUNITY): Payer: Self-pay | Admitting: Internal Medicine

## 2015-06-26 DIAGNOSIS — R609 Edema, unspecified: Secondary | ICD-10-CM

## 2015-06-27 ENCOUNTER — Emergency Department (HOSPITAL_COMMUNITY): Payer: Medicare Other

## 2015-06-27 ENCOUNTER — Inpatient Hospital Stay (HOSPITAL_COMMUNITY)
Admission: EM | Admit: 2015-06-27 | Discharge: 2015-07-02 | DRG: 300 | Disposition: A | Discharge status: Home / Self Care | Payer: Medicare Other | Attending: Family Medicine | Admitting: Family Medicine

## 2015-06-27 ENCOUNTER — Encounter (HOSPITAL_COMMUNITY): Payer: Self-pay | Admitting: *Deleted

## 2015-06-27 ENCOUNTER — Ambulatory Visit (HOSPITAL_COMMUNITY)
Admission: RE | Admit: 2015-06-27 | Discharge: 2015-06-27 | Disposition: A | Discharge status: Home / Self Care | Payer: Medicare Other | Source: Ambulatory Visit | Attending: Internal Medicine | Admitting: Internal Medicine

## 2015-06-27 DIAGNOSIS — R21 Rash and other nonspecific skin eruption: Secondary | ICD-10-CM | POA: Diagnosis present

## 2015-06-27 DIAGNOSIS — Z720 Tobacco use: Secondary | ICD-10-CM | POA: Diagnosis present

## 2015-06-27 DIAGNOSIS — M797 Fibromyalgia: Secondary | ICD-10-CM | POA: Diagnosis present

## 2015-06-27 DIAGNOSIS — E1165 Type 2 diabetes mellitus with hyperglycemia: Secondary | ICD-10-CM | POA: Diagnosis not present

## 2015-06-27 DIAGNOSIS — N049 Nephrotic syndrome with unspecified morphologic changes: Secondary | ICD-10-CM | POA: Diagnosis present

## 2015-06-27 DIAGNOSIS — Z7982 Long term (current) use of aspirin: Secondary | ICD-10-CM

## 2015-06-27 DIAGNOSIS — Z86718 Personal history of other venous thrombosis and embolism: Secondary | ICD-10-CM

## 2015-06-27 DIAGNOSIS — D649 Anemia, unspecified: Secondary | ICD-10-CM | POA: Diagnosis present

## 2015-06-27 DIAGNOSIS — K219 Gastro-esophageal reflux disease without esophagitis: Secondary | ICD-10-CM | POA: Diagnosis present

## 2015-06-27 DIAGNOSIS — L659 Nonscarring hair loss, unspecified: Secondary | ICD-10-CM | POA: Diagnosis present

## 2015-06-27 DIAGNOSIS — R079 Chest pain, unspecified: Secondary | ICD-10-CM | POA: Diagnosis not present

## 2015-06-27 DIAGNOSIS — Z87891 Personal history of nicotine dependence: Secondary | ICD-10-CM

## 2015-06-27 DIAGNOSIS — N179 Acute kidney failure, unspecified: Secondary | ICD-10-CM | POA: Diagnosis present

## 2015-06-27 DIAGNOSIS — A047 Enterocolitis due to Clostridium difficile: Secondary | ICD-10-CM | POA: Diagnosis present

## 2015-06-27 DIAGNOSIS — L299 Pruritus, unspecified: Secondary | ICD-10-CM | POA: Diagnosis present

## 2015-06-27 DIAGNOSIS — R0602 Shortness of breath: Secondary | ICD-10-CM | POA: Diagnosis not present

## 2015-06-27 DIAGNOSIS — Z79899 Other long term (current) drug therapy: Secondary | ICD-10-CM | POA: Diagnosis not present

## 2015-06-27 DIAGNOSIS — Z794 Long term (current) use of insulin: Secondary | ICD-10-CM | POA: Diagnosis not present

## 2015-06-27 DIAGNOSIS — E162 Hypoglycemia, unspecified: Secondary | ICD-10-CM

## 2015-06-27 DIAGNOSIS — R609 Edema, unspecified: Secondary | ICD-10-CM | POA: Insufficient documentation

## 2015-06-27 DIAGNOSIS — J45909 Unspecified asthma, uncomplicated: Secondary | ICD-10-CM | POA: Diagnosis present

## 2015-06-27 DIAGNOSIS — J449 Chronic obstructive pulmonary disease, unspecified: Secondary | ICD-10-CM | POA: Diagnosis present

## 2015-06-27 DIAGNOSIS — E039 Hypothyroidism, unspecified: Secondary | ICD-10-CM | POA: Diagnosis present

## 2015-06-27 DIAGNOSIS — R809 Proteinuria, unspecified: Secondary | ICD-10-CM | POA: Diagnosis present

## 2015-06-27 DIAGNOSIS — I82443 Acute embolism and thrombosis of tibial vein, bilateral: Secondary | ICD-10-CM

## 2015-06-27 DIAGNOSIS — E11649 Type 2 diabetes mellitus with hypoglycemia without coma: Secondary | ICD-10-CM | POA: Diagnosis present

## 2015-06-27 DIAGNOSIS — M7989 Other specified soft tissue disorders: Secondary | ICD-10-CM | POA: Diagnosis not present

## 2015-06-27 DIAGNOSIS — N19 Unspecified kidney failure: Secondary | ICD-10-CM | POA: Diagnosis present

## 2015-06-27 DIAGNOSIS — E876 Hypokalemia: Secondary | ICD-10-CM | POA: Diagnosis present

## 2015-06-27 DIAGNOSIS — I10 Essential (primary) hypertension: Secondary | ICD-10-CM | POA: Diagnosis present

## 2015-06-27 DIAGNOSIS — I82403 Acute embolism and thrombosis of unspecified deep veins of lower extremity, bilateral: Secondary | ICD-10-CM

## 2015-06-27 DIAGNOSIS — E669 Obesity, unspecified: Secondary | ICD-10-CM | POA: Diagnosis present

## 2015-06-27 DIAGNOSIS — E1169 Type 2 diabetes mellitus with other specified complication: Secondary | ICD-10-CM

## 2015-06-27 DIAGNOSIS — Z7951 Long term (current) use of inhaled steroids: Secondary | ICD-10-CM | POA: Diagnosis not present

## 2015-06-27 DIAGNOSIS — E119 Type 2 diabetes mellitus without complications: Secondary | ICD-10-CM

## 2015-06-27 HISTORY — DX: Acute embolism and thrombosis of unspecified deep veins of lower extremity, bilateral: I82.403

## 2015-06-27 LAB — CBC WITH DIFFERENTIAL/PLATELET
Basophils Absolute: 0.1 10*3/uL (ref 0.0–0.1)
Basophils Relative: 1 %
EOS PCT: 19 %
Eosinophils Absolute: 1.7 10*3/uL — ABNORMAL HIGH (ref 0.0–0.7)
HEMATOCRIT: 35.6 % — AB (ref 36.0–46.0)
Hemoglobin: 11.5 g/dL — ABNORMAL LOW (ref 12.0–15.0)
LYMPHS ABS: 3.2 10*3/uL (ref 0.7–4.0)
LYMPHS PCT: 35 %
MCH: 25.8 pg — AB (ref 26.0–34.0)
MCHC: 32.3 g/dL (ref 30.0–36.0)
MCV: 80 fL (ref 78.0–100.0)
MONO ABS: 0.6 10*3/uL (ref 0.1–1.0)
Monocytes Relative: 7 %
NEUTROS ABS: 3.5 10*3/uL (ref 1.7–7.7)
Neutrophils Relative %: 38 %
PLATELETS: 439 10*3/uL — AB (ref 150–400)
RBC: 4.45 MIL/uL (ref 3.87–5.11)
RDW: 13.6 % (ref 11.5–15.5)
WBC: 9.1 10*3/uL (ref 4.0–10.5)

## 2015-06-27 LAB — COMPREHENSIVE METABOLIC PANEL
ALT: 20 U/L (ref 14–54)
AST: 21 U/L (ref 15–41)
Albumin: 1.1 g/dL — ABNORMAL LOW (ref 3.5–5.0)
Alkaline Phosphatase: 356 U/L — ABNORMAL HIGH (ref 38–126)
Anion gap: 6 (ref 5–15)
BILIRUBIN TOTAL: 0.4 mg/dL (ref 0.3–1.2)
BUN: 21 mg/dL — AB (ref 6–20)
CALCIUM: 8 mg/dL — AB (ref 8.9–10.3)
CHLORIDE: 105 mmol/L (ref 101–111)
CO2: 25 mmol/L (ref 22–32)
Creatinine, Ser: 1.44 mg/dL — ABNORMAL HIGH (ref 0.44–1.00)
GFR, EST AFRICAN AMERICAN: 43 mL/min — AB (ref >60)
GFR, EST NON AFRICAN AMERICAN: 37 mL/min — AB (ref >60)
Glucose, Bld: 179 mg/dL — ABNORMAL HIGH (ref 65–99)
Potassium: 3.4 mmol/L — ABNORMAL LOW (ref 3.5–5.1)
Sodium: 136 mmol/L (ref 135–145)
TOTAL PROTEIN: 6.6 g/dL (ref 6.5–8.1)

## 2015-06-27 LAB — GLUCOSE, CAPILLARY: GLUCOSE-CAPILLARY: 150 mg/dL — AB (ref 65–99)

## 2015-06-27 MED ORDER — SODIUM CHLORIDE 0.9% FLUSH
3.0000 mL | Freq: Two times a day (BID) | INTRAVENOUS | Status: DC
  Administered 2015-06-28 – 2015-06-30 (×4): 3 mL via INTRAVENOUS
  Administered 2015-06-30: 10 mL via INTRAVENOUS
  Administered 2015-07-01 – 2015-07-02 (×2): 3 mL via INTRAVENOUS

## 2015-06-27 MED ORDER — ALBUTEROL SULFATE (2.5 MG/3ML) 0.083% IN NEBU: 3.0000 mL | INHALATION_SOLUTION | Freq: Four times a day (QID) | RESPIRATORY_TRACT | Status: DC | PRN

## 2015-06-27 MED ORDER — FUROSEMIDE 20 MG PO TABS
20.0000 mg | ORAL_TABLET | Freq: Two times a day (BID) | ORAL | Status: DC
  Administered 2015-06-28 – 2015-06-30 (×4): 20 mg via ORAL
  Filled 2015-06-27 (×4): qty 1

## 2015-06-27 MED ORDER — VITAMIN C 500 MG PO TABS
1000.0000 mg | ORAL_TABLET | Freq: Every day | ORAL | Status: DC
  Administered 2015-06-28 – 2015-07-02 (×5): 1000 mg via ORAL
  Filled 2015-06-27 (×5): qty 2

## 2015-06-27 MED ORDER — LEVOTHYROXINE SODIUM 100 MCG PO TABS
100.0000 ug | ORAL_TABLET | Freq: Every day | ORAL | Status: DC
  Administered 2015-06-28 – 2015-07-02 (×5): 100 ug via ORAL
  Filled 2015-06-27 (×5): qty 1

## 2015-06-27 MED ORDER — HYDROCODONE-ACETAMINOPHEN 5-325 MG PO TABS
1.0000 | ORAL_TABLET | ORAL | Status: DC | PRN
  Administered 2015-06-28 – 2015-07-02 (×12): 1 via ORAL
  Filled 2015-06-27 (×12): qty 1

## 2015-06-27 MED ORDER — PANTOPRAZOLE SODIUM 40 MG PO TBEC
40.0000 mg | DELAYED_RELEASE_TABLET | Freq: Every day | ORAL | Status: DC
  Administered 2015-06-28 – 2015-07-02 (×5): 40 mg via ORAL
  Filled 2015-06-27 (×5): qty 1

## 2015-06-27 MED ORDER — DULOXETINE HCL 30 MG PO CPEP
30.0000 mg | ORAL_CAPSULE | Freq: Two times a day (BID) | ORAL | Status: DC
  Administered 2015-06-28 – 2015-07-02 (×10): 30 mg via ORAL
  Filled 2015-06-27 (×10): qty 1

## 2015-06-27 MED ORDER — POLYETHYLENE GLYCOL 3350 17 G PO PACK: 17.0000 g | PACK | Freq: Every day | ORAL | Status: DC | PRN

## 2015-06-27 MED ORDER — ACETAMINOPHEN 650 MG RE SUPP: 650.0000 mg | Freq: Four times a day (QID) | RECTAL | Status: DC | PRN

## 2015-06-27 MED ORDER — IOPAMIDOL (ISOVUE-370) INJECTION 76%: 75.0000 mL | Freq: Once | INTRAVENOUS | Status: DC | PRN

## 2015-06-27 MED ORDER — SODIUM CHLORIDE 0.9% FLUSH: 3.0000 mL | INTRAVENOUS | Status: DC | PRN

## 2015-06-27 MED ORDER — ONDANSETRON HCL 4 MG PO TABS: 4.0000 mg | ORAL_TABLET | Freq: Four times a day (QID) | ORAL | Status: DC | PRN

## 2015-06-27 MED ORDER — INSULIN GLARGINE 100 UNIT/ML ~~LOC~~ SOLN
60.0000 [IU] | Freq: Two times a day (BID) | SUBCUTANEOUS | Status: DC
  Administered 2015-06-28 – 2015-06-29 (×4): 60 [IU] via SUBCUTANEOUS
  Filled 2015-06-27 (×8): qty 0.6

## 2015-06-27 MED ORDER — METOPROLOL TARTRATE 50 MG PO TABS
100.0000 mg | ORAL_TABLET | Freq: Two times a day (BID) | ORAL | Status: DC
  Administered 2015-06-28 – 2015-07-02 (×10): 100 mg via ORAL
  Filled 2015-06-27 (×10): qty 2

## 2015-06-27 MED ORDER — INSULIN ASPART 100 UNIT/ML ~~LOC~~ SOLN: 0.0000 [IU] | Freq: Every day | SUBCUTANEOUS | Status: DC

## 2015-06-27 MED ORDER — VITAMIN E 180 MG (400 UNIT) PO CAPS
400.0000 [IU] | ORAL_CAPSULE | Freq: Every day | ORAL | Status: DC
  Administered 2015-06-28 – 2015-07-02 (×5): 400 [IU] via ORAL
  Filled 2015-06-27 (×6): qty 1

## 2015-06-27 MED ORDER — BISACODYL 10 MG RE SUPP: 10.0000 mg | Freq: Every day | RECTAL | Status: DC | PRN

## 2015-06-27 MED ORDER — ONDANSETRON HCL 4 MG/2ML IJ SOLN: 4.0000 mg | Freq: Four times a day (QID) | INTRAMUSCULAR | Status: DC | PRN

## 2015-06-27 MED ORDER — TRAMADOL HCL 50 MG PO TABS: 50.0000 mg | ORAL_TABLET | Freq: Four times a day (QID) | ORAL | Status: DC | PRN

## 2015-06-27 MED ORDER — PRAVASTATIN SODIUM 40 MG PO TABS
40.0000 mg | ORAL_TABLET | Freq: Every day | ORAL | Status: DC
  Administered 2015-06-28 – 2015-07-02 (×5): 40 mg via ORAL
  Filled 2015-06-27 (×5): qty 1

## 2015-06-27 MED ORDER — SODIUM CHLORIDE 0.9% FLUSH
3.0000 mL | Freq: Two times a day (BID) | INTRAVENOUS | Status: DC
  Administered 2015-06-28 – 2015-07-01 (×5): 3 mL via INTRAVENOUS

## 2015-06-27 MED ORDER — ENOXAPARIN SODIUM 120 MG/0.8ML ~~LOC~~ SOLN
SUBCUTANEOUS | Status: AC
  Filled 2015-06-27: qty 0.8

## 2015-06-27 MED ORDER — SODIUM CHLORIDE 0.9 % IV SOLN: 250.0000 mL | INTRAVENOUS | Status: DC | PRN

## 2015-06-27 MED ORDER — ENOXAPARIN SODIUM 120 MG/0.8ML ~~LOC~~ SOLN
1.0000 mg/kg | Freq: Once | SUBCUTANEOUS | Status: DC
  Filled 2015-06-27: qty 0.8

## 2015-06-27 MED ORDER — INSULIN ASPART 100 UNIT/ML ~~LOC~~ SOLN
0.0000 [IU] | Freq: Three times a day (TID) | SUBCUTANEOUS | Status: DC
  Administered 2015-06-30: 3 [IU] via SUBCUTANEOUS

## 2015-06-27 MED ORDER — ACETAMINOPHEN 325 MG PO TABS: 650.0000 mg | ORAL_TABLET | Freq: Four times a day (QID) | ORAL | Status: DC | PRN

## 2015-06-27 MED ORDER — LISINOPRIL 10 MG PO TABS
10.0000 mg | ORAL_TABLET | Freq: Every day | ORAL | Status: DC
  Administered 2015-06-28: 10 mg via ORAL
  Filled 2015-06-27: qty 1

## 2015-06-27 NOTE — ED Provider Notes (Signed)
CSN: [id number]     Arrival date & time 06/27/15  42 History   First MD Initiated Contact with Patient 06/27/15 1822     Chief Complaint  Patient presents with  . DVT     (Consider location/radiation/quality/duration/timing/severity/associated sxs/prior Treatment) Patient is a 65 y.o. female presenting with leg pain.  Leg Pain Location:  Leg Leg location:  L lower leg and R lower leg Pain details:    Quality:  Aching and pressure   Radiates to:  Does not radiate   Severity:  Mild   Onset quality:  Gradual   Duration:  4 weeks   Timing:  Constant   Progression:  Worsening Chronicity:  New Relieved by:  None tried Worsened by:  Nothing tried Ineffective treatments:  None tried Associated symptoms: fever   Associated symptoms: no back pain     Past Medical History  Diagnosis Date  . Diabetes mellitus   . Hypertension   . Fibromyalgia   . Asthma   . PONV (postoperative nausea and vomiting)   . Hyperthyroidism   . Anemia   . H/O hiatal hernia   . GERD (gastroesophageal reflux disease)   . COPD (chronic obstructive pulmonary disease) ([place])   . Inner ear disease   . IBS (irritable bowel syndrome)   . Tachycardia    Past Surgical History  Procedure Laterality Date  . Cholecystectomy    . Abdominal hysterectomy  partial  . Carpal tunnel release Right 1991  . Wrist ganglion excision Left   . Cataract extraction w/phaco Right 05/08/2013    Procedure: CATARACT EXTRACTION PHACO AND INTRAOCULAR LENS PLACEMENT (IOC);  Surgeon: Tonny Branch, MD;  Location: [facility];  Service: Ophthalmology;  Laterality: Right;  CDE 10.31  . Cataract extraction w/phaco Left 08/17/2013    Procedure: CATARACT EXTRACTION PHACO AND INTRAOCULAR LENS PLACEMENT (IOC);  Surgeon: Tonny Branch, MD;  Location: [facility];  Service: Ophthalmology;  Laterality: Left;  CDE:9.03  . Dental surgery     History reviewed. No pertinent family history. Social History  Substance Use Topics  . Smoking status: Former Smoker  -- 0.25 packs/day for 30 years    Types: Cigarettes    Quit date: 02/18/2015  . Smokeless tobacco: Never Used     Comment: some day smoker  . Alcohol Use: No   OB History    No data available     Review of Systems  Constitutional: Positive for fever, activity change, appetite change and unexpected weight change.  Respiratory: Positive for cough and shortness of breath.   Cardiovascular: Positive for chest pain and leg swelling.  Gastrointestinal: Negative for nausea, vomiting and abdominal pain.  Musculoskeletal: Negative for back pain and joint swelling.  Skin: Negative for pallor, rash and wound.  All other systems reviewed and are negative.     Allergies  Tetracyclines & related; Banana; and Penicillins  Home Medications   Prior to Admission medications   Medication Sig Start Date End Date Taking? Authorizing Provider  albuterol (PROVENTIL HFA;VENTOLIN HFA) 108 (90 BASE) MCG/ACT inhaler Inhale 2 puffs into the lungs every 6 (six) hours as needed for wheezing or shortness of breath.   Yes Historical Provider, MD  albuterol (PROVENTIL) (2.5 MG/3ML) 0.083% nebulizer solution Take 2.5 mg by nebulization every 6 (six) hours as needed for wheezing or shortness of breath.   Yes Historical Provider, MD  aspirin EC 81 MG tablet Take 81 mg by mouth at bedtime.    Yes Historical Provider, MD  Bilberry, Vaccinium myrtillus,  100 MG CAPS Take 1 capsule by mouth 2 (two) times daily.   Yes Historical Provider, MD  cetirizine (ZYRTEC) 10 MG tablet Take 10 mg by mouth daily.   Yes Historical Provider, MD  Cholecalciferol (VITAMIN D) 1000 UNITS capsule Take 1,000 Units by mouth 2 (two) times daily.    Yes Historical Provider, MD  clobetasol ointment (TEMOVATE) 1.27 % Apply 1 application topically 2 (two) times daily.  06/20/15  Yes Historical Provider, MD  Cyanocobalamin (VITAMIN B-12 PO) Take 1 tablet by mouth daily.     Yes Historical Provider, MD  diclofenac (VOLTAREN) 50 MG EC tablet Take 1  tablet (50 mg total) by mouth 2 (two) times daily. 03/04/15  Yes Carole Civil, MD  DULoxetine (CYMBALTA) 30 MG capsule Take 30 mg by mouth 2 (two) times daily.  06/20/15  Yes Historical Provider, MD  furosemide (LASIX) 40 MG tablet Take 0.5 tablets (20 mg total) by mouth 2 (two) times daily. Patient taking differently: Take 40 mg by mouth daily as needed for fluid.  06/25/14  Yes Lendon Colonel, NP  HYDROcodone-acetaminophen (NORCO/VICODIN) 5-325 MG tablet Take 1 tablet by mouth every 4 (four) hours as needed. 02/08/15  Yes Lily Kocher, PA-C  hydrOXYzine (ATARAX/VISTARIL) 25 MG tablet Take 25 mg by mouth every 8 (eight) hours as needed.  06/20/15  Yes Historical Provider, MD  Insulin Glargine (LANTUS SOLOSTAR) 100 UNIT/ML Solostar Pen Inject 60 Units into the skin 2 (two) times daily.   Yes Historical Provider, MD  insulin lispro (HUMALOG KWIKPEN) 100 UNIT/ML KiwkPen Inject 9-18 Units into the skin 3 (three) times daily. 9 units for blood sugar levels under 100. Give 18 units as directed   Yes Historical Provider, MD  JENTADUETO 2.5-500 MG TABS Take 1 tablet by mouth 2 (two) times daily.  06/21/15  Yes Historical Provider, MD  levothyroxine (SYNTHROID, LEVOTHROID) 100 MCG tablet Take 100 mcg by mouth daily before breakfast.  11/23/13  Yes Historical Provider, MD  lisinopril (PRINIVIL,ZESTRIL) 10 MG tablet Take 10 mg by mouth daily.   Yes Historical Provider, MD  Melatonin 5 MG CAPS Take 2 capsules by mouth at bedtime.   Yes Historical Provider, MD  metoprolol (LOPRESSOR) 100 MG tablet TAKE 1 TABLET BY MOUTH TWICE DAILY **TAKE WITH 25MG TABLET FOR TOTAL DOSAGE OF 125MG** 06/25/15  Yes Lendon Colonel, NP  metoprolol tartrate (LOPRESSOR) 25 MG tablet TAKE 1 TABLET BY MOUTH TWICE DAILY **TAKE WITH 100MG TABLET FOR TOTAL DOSAGE OF 125MG** 06/25/15  Yes Lendon Colonel, NP  mupirocin ointment (BACTROBAN) 2 % Apply 1 application topically 2 (two) times daily.  06/20/15  Yes Historical Provider, MD   pantoprazole (PROTONIX) 40 MG tablet Take 40 mg by mouth daily.  06/21/15  Yes Historical Provider, MD  pravastatin (PRAVACHOL) 40 MG tablet Take 40 mg by mouth daily.  06/20/15  Yes Historical Provider, MD  Doug Sou 180 MCG/ACT inhaler Inhale 2 puffs into the lungs daily as needed (for shortness of breath-wheezing).  06/20/15  Yes Historical Provider, MD  sitaGLIPtin-metformin (JANUMET) 50-1000 MG tablet Take 1 tablet by mouth 2 (two) times daily with a meal.   Yes Historical Provider, MD  traMADol (ULTRAM) 50 MG tablet Take 1 tablet (50 mg total) by mouth every 6 (six) hours as needed. 05/27/15  Yes [name], MD  vitamin C (ASCORBIC ACID) 500 MG tablet Take 1,000 mg by mouth daily.   Yes Historical Provider, MD  vitamin E 400 UNIT capsule Take 400 Units by  mouth daily.   Yes Historical Provider, MD  clindamycin (CLEOCIN) 300 MG capsule Take 1 capsule (300 mg total) by mouth 4 (four) times daily. Patient not taking: Reported on 06/27/2015 05/27/15   [name], MD   BP 126/63 mmHg  Pulse 72  Temp(Src) 97.8 F (36.6 C) (Oral)  Resp 18  Ht 5' 7.5" (1.715 m)  Wt 228 lb (103.42 kg)  BMI 35.16 kg/m2  SpO2 100% Physical Exam  Constitutional: She is oriented to person, place, and time. She appears well-developed and well-nourished.  HENT:  Head: Normocephalic and atraumatic.  Neck: Normal range of motion.  Cardiovascular: Normal rate and regular rhythm.   Pulmonary/Chest: Effort normal. No stridor. No respiratory distress. She has wheezes.  Abdominal: Soft. Bowel sounds are normal. She exhibits no distension. There is no tenderness.  Musculoskeletal: She exhibits edema (bilateral, 1+ to knees) and tenderness (bilateral).  Neurological: She is alert and oriented to person, place, and time. No cranial nerve deficit. Coordination normal.  Skin: Skin is warm and dry. No erythema.  Multiple wounds in varying stages of healing to LE's  Nursing note and vitals reviewed.   ED Course   Angiocath insertion Date/Time: 06/27/2015 8:25 PM Performed by: Merrily Pew Authorized by: Merrily Pew Consent: Verbal consent obtained. Risks and benefits: risks, benefits and alternatives were discussed Consent given by: patient Preparation: Patient was prepped and draped in the usual sterile fashion. Local anesthesia used: no Patient sedated: no Patient tolerance: Patient tolerated the procedure well with no immediate complications   (including critical care time) Labs Review Labs Reviewed  CBC WITH DIFFERENTIAL/PLATELET - Abnormal; Notable for the following:    Hemoglobin 11.5 (*)    HCT 35.6 (*)    MCH 25.8 (*)    Platelets 439 (*)    Eosinophils Absolute 1.7 (*)    All other components within normal limits  COMPREHENSIVE METABOLIC PANEL - Abnormal; Notable for the following:    Potassium 3.4 (*)    Glucose, Bld 179 (*)    BUN 21 (*)    Creatinine, Ser 1.44 (*)    Calcium 8.0 (*)    Albumin 1.1 (*)    Alkaline Phosphatase 356 (*)    GFR calc non Af Amer 37 (*)    GFR calc Af Amer 43 (*)    All other components within normal limits    Imaging Review US Venous Img Lower Bilateral  06/27/2015  CLINICAL DATA:  Bilateral leg swelling, initial encounter EXAM: BILATERAL LOWER EXTREMITY VENOUS DOPPLER ULTRASOUND TECHNIQUE: Gray-scale sonography with graded compression, as well as color Doppler and duplex ultrasound were performed to evaluate the lower extremity deep venous systems from the level of the common femoral vein and including the common femoral, femoral, profunda femoral, popliteal and calf veins including the posterior tibial, peroneal and gastrocnemius veins when visible. The superficial great saphenous vein was also interrogated. Spectral Doppler was utilized to evaluate flow at rest and with distal augmentation maneuvers in the common femoral, femoral and popliteal veins. COMPARISON:  None. FINDINGS: RIGHT LOWER EXTREMITY Common Femoral Vein: No evidence of  thrombus. Normal compressibility, respiratory phasicity and response to augmentation. Saphenofemoral Junction: No evidence of thrombus. Normal compressibility and flow on color Doppler imaging. Profunda Femoral Vein: No evidence of thrombus. Normal compressibility and flow on color Doppler imaging. Femoral Vein: No evidence of thrombus. Normal compressibility, respiratory phasicity and response to augmentation. Popliteal Vein: No evidence of thrombus. Normal compressibility, respiratory phasicity and response to augmentation. Calf Veins: Increased echogenicity  is noted within the posterior tibial vein with decreased compressibility consistent with thrombus. Superficial Great Saphenous Vein: No evidence of thrombus. Normal compressibility and flow on color Doppler imaging. Venous Reflux:  None. Other Findings: Generalized subcutaneous edema is noted within the calf. LEFT LOWER EXTREMITY Common Femoral Vein: No evidence of thrombus. Normal compressibility, respiratory phasicity and response to augmentation. Saphenofemoral Junction: No evidence of thrombus. Normal compressibility and flow on color Doppler imaging. Profunda Femoral Vein: No evidence of thrombus. Normal compressibility and flow on color Doppler imaging. Femoral Vein: No evidence of thrombus. Normal compressibility, respiratory phasicity and response to augmentation. Popliteal Vein: No evidence of thrombus. Normal compressibility, respiratory phasicity and response to augmentation. Calf Veins: Increased echogenicity is noted within the posterior tibial vein with decreased compressibility consistent with thrombus. Superficial Great Saphenous Vein: No evidence of thrombus. Normal compressibility and flow on color Doppler imaging. Venous Reflux:  None. Other Findings: Generalized subcutaneous edema is noted within the calf. Multiple prominent lymph nodes are noted in the left groin consistent with reactive nodes. IMPRESSION: Bilateral posterior tibial vein  thrombus. Electronically Signed   By: Inez Catalina M.D.   On: 06/27/2015 15:15   I have personally reviewed and evaluated these images and lab results as part of my medical decision-making.   EKG Interpretation None      MDM   Final diagnoses:  Chest pain   Bilateral DVT's. Recent weight loss (50 lb), cough, sob, multiple bronchitis episodes. Also with decreased appetite/difficulty eating and decreased activity.  Will rule out PE. Since giving contrast and neoplastic process is on differential I will extend CT through abdomen/pelvis to ensure no malignancy. Either way wil likely need admission to start anticoagulation.  lovenox given.   [place] pIV started, but came out before CT scan so not done. Will get PICC line. Also discussed with hospitalist for admission and will forego the contrasted scans for now.     Merrily Pew, MD 06/27/15 2228

## 2015-06-27 NOTE — ED Notes (Signed)
1 failed attempt at establishing IV access.  Due to limited site availability, requested Ruthy Dick to assess pt for IV

## 2015-06-27 NOTE — ED Notes (Signed)
Pt reports she just had an ultrasound on both legs and she was told that she had blood clots in both legs and to come to ED.

## 2015-06-27 NOTE — H&P (Signed)
[facility] History and Physical  Stephanie [name] KZL:[id number] DOB: [birth year] DOA: 06/27/2015  Referring physician: Dr. Dolly Rias PCP: [name], MD   Chief Complaint: Blood clots in the legs  HPI: Stephanie [name] is a 65 y.o. female with history of DM2, HTN , FM, DJD and cataracts presented to ED after LE doppler showed bilat LE DVT.  She saw her PCP last week for bilat LE edema and dopplers were scheduled for today and were + bilat.  In ED creat up 1.4, received lovenox one time for DVT, asked to see for admission.    Patient has pruritic rash which is on the arms and legs mostly going on one year's duration and hair loss with significant loss in front and by the temples.  Has seen derm in [place] w/o a diagnosis.  Losing weight.  Poor appetite.  Told she had "low protein" recently.  Denies mouth ulcers.    Her legs have been swelling for several weeks, comes and goes according to patient.  No erythema or pain involved. No hx DVT in the past.    Labs in chart from April 10th show a urine prot-to-creat ratio of 21.89, which is about 21 gm proteinuria/ day approx.  UA from same day >  >300 prot, 0-5 wbc, 0-5 rbc, 6-30 epis, 1.020.     ROS  denies CP  no joint pain   no HA  no blurry vision  no rash  no diarrhea  no nausea/ vomiting  no dysuria  no difficulty voiding  no change in urine color    Where does patient live home w daughter Can patient participate in ADLs? yes  Past Medical History  Past Medical History  Diagnosis Date  . Diabetes mellitus   . Hypertension   . Fibromyalgia   . Asthma   . PONV (postoperative nausea and vomiting)   . Hyperthyroidism   . Anemia   . H/O hiatal hernia   . GERD (gastroesophageal reflux disease)   . COPD (chronic obstructive pulmonary disease) ([place])   . Inner ear disease   . IBS (irritable bowel syndrome)   . Tachycardia    Past Surgical History  Past Surgical History  Procedure Laterality Date  . Cholecystectomy    .  Abdominal hysterectomy  partial  . Carpal tunnel release Right 1991  . Wrist ganglion excision Left   . Cataract extraction w/phaco Right 05/08/2013    Procedure: CATARACT EXTRACTION PHACO AND INTRAOCULAR LENS PLACEMENT (IOC);  Surgeon: Tonny Branch, MD;  Location: [facility];  Service: Ophthalmology;  Laterality: Right;  CDE 10.31  . Cataract extraction w/phaco Left 08/17/2013    Procedure: CATARACT EXTRACTION PHACO AND INTRAOCULAR LENS PLACEMENT (IOC);  Surgeon: Tonny Branch, MD;  Location: [facility];  Service: Ophthalmology;  Laterality: Left;  CDE:9.03  . Dental surgery     Family History History reviewed. No pertinent family history. Social History  reports that she quit smoking about 4 months ago. Her smoking use included Cigarettes. She has a 7.5 pack-year smoking history. She has never used smokeless tobacco. She reports that she does not drink alcohol or use illicit drugs. Allergies  Allergies  Allergen Reactions  . Tetracyclines & Related Anaphylaxis  . Banana Hives and Nausea And Vomiting  . Penicillins Rash    Has patient had a PCN reaction causing immediate rash, facial/tongue/throat swelling, SOB or lightheadedness with hypotension: No Has patient had a PCN reaction causing severe rash involving mucus membranes or skin necrosis: No Has  patient had a PCN reaction that required hospitalization No Has patient had a PCN reaction occurring within the last 10 years: No If all of the above answers are "NO", then may proceed with Cephalosporin use.    Home medications Prior to Admission medications   Medication Sig Start Date End Date Taking? Authorizing Provider  albuterol (PROVENTIL HFA;VENTOLIN HFA) 108 (90 BASE) MCG/ACT inhaler Inhale 2 puffs into the lungs every 6 (six) hours as needed for wheezing or shortness of breath.   Yes Historical Provider, MD  albuterol (PROVENTIL) (2.5 MG/3ML) 0.083% nebulizer solution Take 2.5 mg by nebulization every 6 (six) hours as needed for wheezing or  shortness of breath.   Yes Historical Provider, MD  aspirin EC 81 MG tablet Take 81 mg by mouth at bedtime.    Yes Historical Provider, MD  Bilberry, Vaccinium myrtillus, 100 MG CAPS Take 1 capsule by mouth 2 (two) times daily.   Yes Historical Provider, MD  cetirizine (ZYRTEC) 10 MG tablet Take 10 mg by mouth daily.   Yes Historical Provider, MD  Cholecalciferol (VITAMIN D) 1000 UNITS capsule Take 1,000 Units by mouth 2 (two) times daily.    Yes Historical Provider, MD  clobetasol ointment (TEMOVATE) 6.38 % Apply 1 application topically 2 (two) times daily.  06/20/15  Yes Historical Provider, MD  Cyanocobalamin (VITAMIN B-12 PO) Take 1 tablet by mouth daily.     Yes Historical Provider, MD  diclofenac (VOLTAREN) 50 MG EC tablet Take 1 tablet (50 mg total) by mouth 2 (two) times daily. 03/04/15  Yes Carole Civil, MD  DULoxetine (CYMBALTA) 30 MG capsule Take 30 mg by mouth 2 (two) times daily.  06/20/15  Yes Historical Provider, MD  furosemide (LASIX) 40 MG tablet Take 0.5 tablets (20 mg total) by mouth 2 (two) times daily. Patient taking differently: Take 40 mg by mouth daily as needed for fluid.  06/25/14  Yes Lendon Colonel, NP  HYDROcodone-acetaminophen (NORCO/VICODIN) 5-325 MG tablet Take 1 tablet by mouth every 4 (four) hours as needed. 02/08/15  Yes Lily Kocher, PA-C  hydrOXYzine (ATARAX/VISTARIL) 25 MG tablet Take 25 mg by mouth every 8 (eight) hours as needed.  06/20/15  Yes Historical Provider, MD  Insulin Glargine (LANTUS SOLOSTAR) 100 UNIT/ML Solostar Pen Inject 60 Units into the skin 2 (two) times daily.   Yes Historical Provider, MD  insulin lispro (HUMALOG KWIKPEN) 100 UNIT/ML KiwkPen Inject 9-18 Units into the skin 3 (three) times daily. 9 units for blood sugar levels under 100. Give 18 units as directed   Yes Historical Provider, MD  JENTADUETO 2.5-500 MG TABS Take 1 tablet by mouth 2 (two) times daily.  06/21/15  Yes Historical Provider, MD  levothyroxine (SYNTHROID, LEVOTHROID) 100  MCG tablet Take 100 mcg by mouth daily before breakfast.  11/23/13  Yes Historical Provider, MD  lisinopril (PRINIVIL,ZESTRIL) 10 MG tablet Take 10 mg by mouth daily.   Yes Historical Provider, MD  Melatonin 5 MG CAPS Take 2 capsules by mouth at bedtime.   Yes Historical Provider, MD  metoprolol (LOPRESSOR) 100 MG tablet TAKE 1 TABLET BY MOUTH TWICE DAILY **TAKE WITH 25MG TABLET FOR TOTAL DOSAGE OF 125MG** 06/25/15  Yes Lendon Colonel, NP  metoprolol tartrate (LOPRESSOR) 25 MG tablet TAKE 1 TABLET BY MOUTH TWICE DAILY **TAKE WITH 100MG TABLET FOR TOTAL DOSAGE OF 125MG** 06/25/15  Yes Lendon Colonel, NP  mupirocin ointment (BACTROBAN) 2 % Apply 1 application topically 2 (two) times daily.  06/20/15  Yes Historical Provider,  MD  pantoprazole (PROTONIX) 40 MG tablet Take 40 mg by mouth daily.  06/21/15  Yes Historical Provider, MD  pravastatin (PRAVACHOL) 40 MG tablet Take 40 mg by mouth daily.  06/20/15  Yes Historical Provider, MD  Doug Sou 180 MCG/ACT inhaler Inhale 2 puffs into the lungs daily as needed (for shortness of breath-wheezing).  06/20/15  Yes Historical Provider, MD  sitaGLIPtin-metformin (JANUMET) 50-1000 MG tablet Take 1 tablet by mouth 2 (two) times daily with a meal.   Yes Historical Provider, MD  traMADol (ULTRAM) 50 MG tablet Take 1 tablet (50 mg total) by mouth every 6 (six) hours as needed. 05/27/15  Yes [name], MD  vitamin C (ASCORBIC ACID) 500 MG tablet Take 1,000 mg by mouth daily.   Yes Historical Provider, MD  vitamin E 400 UNIT capsule Take 400 Units by mouth daily.   Yes Historical Provider, MD  clindamycin (CLEOCIN) 300 MG capsule Take 1 capsule (300 mg total) by mouth 4 (four) times daily. Patient not taking: Reported on 06/27/2015 05/27/15   [name], MD   Liver Function Tests  Recent Labs Lab 06/27/15 2020  AST 21  ALT 20  ALKPHOS 356*  BILITOT 0.4  PROT 6.6  ALBUMIN 1.1*   No results for input(s): LIPASE, AMYLASE in the last 168 hours. CBC  Recent  Labs Lab 06/27/15 2020  WBC 9.1  NEUTROABS 3.5  HGB 11.5*  HCT 35.6*  MCV 80.0  PLT 751*   Basic Metabolic Panel  Recent Labs Lab 06/27/15 2020  NA 136  K 3.4*  CL 105  CO2 25  GLUCOSE 179*  BUN 21*  CREATININE 1.44*  CALCIUM 8.0*     Filed Vitals:   06/27/15 1605 06/27/15 1900 06/27/15 2157  BP: 149/62 131/72 126/63  Pulse: 67 72 72  Temp: 97.8 F (36.6 C)    TempSrc: Oral    Resp: _0 Height: 5' 7.5" (1.715 m)    Weight: 103.42 kg (228 lb)    SpO2: 100% 100% 100%   Exam: VSS BP good 140/65, P 70  R 18  Temp 97.8   100% on RA Gen chron ill appearing, diffuse maculopapular rash in various stages , ulcerated center Sig alopecia Sclera anicteric, throat clear  No jvd or bruits, no nodes Breast exam no mass bilat Chest clear bilat RRR no MRG Abd soft ntnd no mass or ascites +bs obese GU defer MS no joint effusions or deformity Ext 2-3+ bilat LE edema below the knees, no wounds or ulcers Neuro is alert, Ox 3 , nf   Home medications >> Lasix, lisinopril, metoprolol Albuterol, zyrtec, diclofenac, cymbalta, norco, atarax Lantus, humalog insulin, Jentadueto 2.5-500 (linagliptin-metformin), Janumet (sitagliptin-metformin) Synthroid, melatonin, protonix, pravachol, ultram  4/10 >> Urine prot-to-creat ratio 21.89 = approx 21 gm proteinuria/ day 4/10  >> UA  300 prot, 0-5 wbc, 0-5 rbc, 6-30 epis, 1.020.   Na 135  K 3.4  CO2 25  BUN 21  Creat 1.44  eGFR 43  Glu 179  Alk phos 356  LFT"s ok WBC 9k  Hb 11.5  plt 435  Assessment: 1. Bilateral LE DVT - in setting of severe neph syndrome. Will plan IV heparin for now in case renal bx needed.  2. Severe neph syndrome/ renal insuff - in setting of chronic skin rash / alopecia, eval for SLE/ vasculitis.  Renal consult am.  No nsaids or IV contrast.  3. DM 2 longstanding, use insulin only, hold metformin w [place] 4.  HTN cont acei/ metop for now  Plan - as above   DVT Prophylaxis will be on IV hep  Code  Status: full   Family Communication: daughter at bedside  Disposition Plan: home when better    Sol Blazing [facility] Pager [phone]  Cell [phone]  If 7PM-7AM, please contact night-coverage [email or web address] Password [facility] 06/27/2015, 10:26 PM

## 2015-06-27 NOTE — Progress Notes (Signed)
ANTICOAGULATION CONSULT NOTE - Preliminary  Pharmacy Consult for heparin Indication: DVT  Allergies  Allergen Reactions  . Tetracyclines & Related Anaphylaxis  . Banana Hives and Nausea And Vomiting  . Penicillins Rash    Has patient had a PCN reaction causing immediate rash, facial/tongue/throat swelling, SOB or lightheadedness with hypotension: No Has patient had a PCN reaction causing severe rash involving mucus membranes or skin necrosis: No Has patient had a PCN reaction that required hospitalization No Has patient had a PCN reaction occurring within the last 10 years: No If all of the above answers are "NO", then may proceed with Cephalosporin use.     Patient Measurements: Height: 5' 7.5" (171.5 cm) Weight: 230 lb 14.4 oz (104.736 kg) IBW/kg (Calculated) : 62.75 HEPARIN DW (KG): 86.3   Vital Signs: Temp: 98 F (36.7 C) (05/11 2318) Temp Source: Oral (05/11 2318) BP: 141/94 mmHg (05/11 2318) Pulse Rate: 78 (05/11 2318)  Labs:  Recent Labs  06/27/15 2020  HGB 11.5*  HCT 35.6*  PLT 439*  CREATININE 1.44*   Estimated Creatinine Clearance: 49.6 mL/min (by C-G formula based on Cr of 1.44).  Medical History: Past Medical History  Diagnosis Date  . Diabetes mellitus   . Hypertension   . Fibromyalgia   . Asthma   . PONV (postoperative nausea and vomiting)   . Hyperthyroidism   . Anemia   . H/O hiatal hernia   . GERD (gastroesophageal reflux disease)   . COPD (chronic obstructive pulmonary disease) ([place])   . Inner ear disease   . IBS (irritable bowel syndrome)   . Tachycardia     Medications:  Scheduled:  . DULoxetine  30 mg Oral BID  . furosemide  20 mg Oral BID  . insulin aspart  0-20 Units Subcutaneous TID WC  . insulin aspart  0-5 Units Subcutaneous QHS  . insulin glargine  60 Units Subcutaneous BID  . levothyroxine  100 mcg Oral QAC breakfast  . lisinopril  10 mg Oral Daily  . metoprolol  100 mg Oral BID  . pantoprazole  40 mg Oral Daily  .  pravastatin  40 mg Oral Daily  . sodium chloride flush  3 mL Intravenous Q12H  . sodium chloride flush  3 mL Intravenous Q12H  . vitamin C  1,000 mg Oral Daily  . vitamin E  400 Units Oral Daily   Infusions:  . heparin 1,500 Units/hr (06/28/15 0118)   PRN: sodium chloride, acetaminophen **OR** acetaminophen, albuterol, bisacodyl, HYDROcodone-acetaminophen, ondansetron **OR** ondansetron (ZOFRAN) IV, polyethylene glycol, sodium chloride flush, traMADol Anti-infectives    None      Assessment: 65 yo female starting heparin for bilateral LE DVT. No bleeding, INR 1.01  Goal of Therapy:  Heparin level 0.3-0.7 units/ml   Plan:  Give 5500 units bolus x 1 Start heparin infusion at 1500 units/hr Check anti-Xa level in 6 hours and daily while on heparin Continue to monitor H&H and platelets Preliminary review of pertinent patient information completed.  Forestine Na clinical pharmacist will complete review during morning rounds to assess the patient and finalize treatment regimen.  Nyra Capes, RPH 06/27/2015,11:59 PM

## 2015-06-28 DIAGNOSIS — E1165 Type 2 diabetes mellitus with hyperglycemia: Secondary | ICD-10-CM

## 2015-06-28 LAB — PROTIME-INR
INR: 1.01 (ref 0.00–1.49)
PROTHROMBIN TIME: 13.5 s (ref 11.6–15.2)

## 2015-06-28 LAB — CBC
HCT: 32.8 % — ABNORMAL LOW (ref 36.0–46.0)
Hemoglobin: 10.6 g/dL — ABNORMAL LOW (ref 12.0–15.0)
MCH: 25.9 pg — ABNORMAL LOW (ref 26.0–34.0)
MCHC: 32.3 g/dL (ref 30.0–36.0)
MCV: 80.2 fL (ref 78.0–100.0)
PLATELETS: 407 10*3/uL — AB (ref 150–400)
RBC: 4.09 MIL/uL (ref 3.87–5.11)
RDW: 13.6 % (ref 11.5–15.5)
WBC: 10.8 10*3/uL — AB (ref 4.0–10.5)

## 2015-06-28 LAB — URINALYSIS, ROUTINE W REFLEX MICROSCOPIC
GLUCOSE, UA: 100 mg/dL — AB
LEUKOCYTES UA: NEGATIVE
Nitrite: NEGATIVE
Specific Gravity, Urine: 1.02 (ref 1.005–1.030)
pH: 6.5 (ref 5.0–8.0)

## 2015-06-28 LAB — URINE MICROSCOPIC-ADD ON

## 2015-06-28 LAB — BASIC METABOLIC PANEL
Anion gap: 4 — ABNORMAL LOW (ref 5–15)
BUN: 21 mg/dL — AB (ref 6–20)
CALCIUM: 7.5 mg/dL — AB (ref 8.9–10.3)
CO2: 25 mmol/L (ref 22–32)
CREATININE: 1.24 mg/dL — AB (ref 0.44–1.00)
Chloride: 106 mmol/L (ref 101–111)
GFR calc Af Amer: 52 mL/min — ABNORMAL LOW (ref >60)
GFR, EST NON AFRICAN AMERICAN: 45 mL/min — AB (ref >60)
GLUCOSE: 114 mg/dL — AB (ref 65–99)
POTASSIUM: 3 mmol/L — AB (ref 3.5–5.1)
SODIUM: 135 mmol/L (ref 135–145)

## 2015-06-28 LAB — GLUCOSE, CAPILLARY
GLUCOSE-CAPILLARY: 107 mg/dL — AB (ref 65–99)
Glucose-Capillary: 103 mg/dL — ABNORMAL HIGH (ref 65–99)

## 2015-06-28 LAB — MRSA PCR SCREENING: MRSA BY PCR: NEGATIVE

## 2015-06-28 LAB — C DIFFICILE QUICK SCREEN W PCR REFLEX
C Diff antigen: POSITIVE — AB
C Diff toxin: NEGATIVE

## 2015-06-28 LAB — SEDIMENTATION RATE: Sed Rate: 17 mm/hr (ref 0–22)

## 2015-06-28 LAB — HEPARIN LEVEL (UNFRACTIONATED): HEPARIN UNFRACTIONATED: 0.66 [IU]/mL (ref 0.30–0.70)

## 2015-06-28 LAB — PROTEIN / CREATININE RATIO, URINE
CREATININE, URINE: 325.05 mg/dL
PROTEIN CREATININE RATIO: 10.34 mg/mg{creat} — AB (ref 0.00–0.15)
TOTAL PROTEIN, URINE: 3362 mg/dL

## 2015-06-28 LAB — APTT: APTT: 30 s (ref 24–37)

## 2015-06-28 MED ORDER — BOOST / RESOURCE BREEZE PO LIQD
1.0000 | Freq: Three times a day (TID) | ORAL | Status: DC
  Administered 2015-06-28 – 2015-07-02 (×11): 1 via ORAL

## 2015-06-28 MED ORDER — SODIUM CHLORIDE 0.9 % IV SOLN
INTRAVENOUS | Status: DC
  Administered 2015-06-28 – 2015-06-29 (×4): via INTRAVENOUS

## 2015-06-28 MED ORDER — POTASSIUM CHLORIDE CRYS ER 20 MEQ PO TBCR
40.0000 meq | EXTENDED_RELEASE_TABLET | Freq: Two times a day (BID) | ORAL | Status: AC
  Administered 2015-06-28 (×2): 40 meq via ORAL
  Filled 2015-06-28 (×2): qty 2

## 2015-06-28 MED ORDER — HEPARIN BOLUS VIA INFUSION
5500.0000 [IU] | Freq: Once | INTRAVENOUS | Status: AC
  Administered 2015-06-28: 5500 [IU] via INTRAVENOUS
  Filled 2015-06-28: qty 5500

## 2015-06-28 MED ORDER — DIPHENHYDRAMINE HCL 25 MG PO CAPS
25.0000 mg | ORAL_CAPSULE | Freq: Four times a day (QID) | ORAL | Status: DC | PRN
Start: 2015-06-28 — End: 2015-07-02
  Administered 2015-06-28 – 2015-07-02 (×7): 25 mg via ORAL
  Filled 2015-06-28 (×7): qty 1

## 2015-06-28 MED ORDER — HEPARIN (PORCINE) IN NACL 100-0.45 UNIT/ML-% IJ SOLN
1550.0000 [IU]/h | INTRAMUSCULAR | Status: DC
  Administered 2015-06-28 (×2): 1500 [IU]/h via INTRAVENOUS
  Filled 2015-06-28 (×4): qty 250

## 2015-06-28 NOTE — Care Management Important Message (Signed)
Important Message  Patient Details  Name: Stephanie [name] MRN: [id number] Date of Birth: [birth year]   Medicare Important Message Given:  Yes    Alvie Heidelberg, RN 06/28/2015, 9:51 AM

## 2015-06-28 NOTE — Progress Notes (Signed)
Initial Nutrition Assessment  DOCUMENTATION CODES:   Obesity unspecified  INTERVENTION:  Boost Breeze po TID, each supplement provides 250 kcal and 9 grams of protein  (pt prefers peach flavor).  Encourage meal intake -Soft diet   NUTRITION DIAGNOSIS:   Inadequate oral intake related to poor appetite as evidenced by per patient/family report.   GOAL:   Patient will meet greater than or equal to 90% of their needs   MONITOR:   PO intake, Supplement acceptance, Labs, Weight trends  REASON FOR ASSESSMENT:   Malnutrition Screening Tool    ASSESSMENT:   65 y.o. female with history of DM2, HTN , FM, DJD and cataracts presented to ED after LE doppler showed bilat LE DVT. She saw her PCP last week for bilat LE edema and dopplers were scheduled for today and were + bilat. In ED creat up 1.4,   Pt c/o 55# weight loss since February but hospital weight hx does not support this. Her usual weight has ranged from 225-230#. There is a wt from last month of 200# (90.7 kg) which I suspect is an outlier in light of the rest of her available weight hx which goes back to November 2015 when she weighed 237#.   Pt c/o of very poor appetite and says she is only eating bites of food. There is no meal intake hx available at this time to compare. She refuses Ensure supplements and says she doesn't tolerate them well due to IBS. Although, she does drink buttermilk without problems.  Pt has ample fat stores. Edema noted to bilateral lower extremities which is likely impacting her current weight but it's difficult to determine to what extent.  Abnormal labs: potassium (3.0). BUN 21, Cr 1.24.  UA-proteinuria. CBG's 103,107 today  Diet Order:  Diet regular Room service appropriate?: Yes; Fluid consistency:: Thin  Skin:   dry, itching   Last BM:  PTA?  Height:   Ht Readings from Last 1 Encounters:  06/27/15 5' 7.5" (1.715 m)    Weight:   Wt Readings from Last 1 Encounters:  06/28/15 230  lb 14.4 oz (104.736 kg)    Ideal Body Weight:  64 kg  BMI:  Body mass index is 35.61 kg/(m^2).  Estimated Nutritional Needs:   Kcal:  [id number]   Protein:  96-128 gr  Fluid:  >1600 ml daily  EDUCATION NEEDS:   No education needs identified at this time  Colman Cater MS,RD,CSG,LDN Office: [phone] Pager: [phone]

## 2015-06-28 NOTE — Consult Note (Signed)
Reason for Consult: Renal failure, nephrotic range proteinuria Referring Physician: Dr. Consuelo [name] Stephanie [name] is an 65 y.o. female.  HPI: She is a patient who has long standing history of skin rash, dating back to 23. During that time patient developed generalized skin rash, itching. The workup for lupus was negative. Possibility of some sort of vasculitis was entertained and patient was put on a steroid for about 8-10 years. After that patient developed diabetes. According to her the rash comes and goes at different times. Since then she has never been on steroid. She was seen by dermatologist but no skin biopsy was done and she was treated empirically. Presently she was found to have bilateral DVT hence admitted to the hospital. During her admission patient was found to have elevated BUN and creatinine and also nephrotic range proteinuria. Presently patient denies any nausea or vomiting. She has some abdominal pain with intermittent diarrhea. No blood in her stool.  Past Medical History  Diagnosis Date  . Diabetes mellitus   . Hypertension   . Fibromyalgia   . Asthma   . PONV (postoperative nausea and vomiting)   . Hyperthyroidism   . Anemia   . H/O hiatal hernia   . GERD (gastroesophageal reflux disease)   . COPD (chronic obstructive pulmonary disease) ([place])   . Inner ear disease   . IBS (irritable bowel syndrome)   . Tachycardia     Past Surgical History  Procedure Laterality Date  . Cholecystectomy    . Abdominal hysterectomy  partial  . Carpal tunnel release Right 1991  . Wrist ganglion excision Left   . Cataract extraction w/phaco Right 05/08/2013    Procedure: CATARACT EXTRACTION PHACO AND INTRAOCULAR LENS PLACEMENT (IOC);  Surgeon: Tonny Branch, MD;  Location: [facility];  Service: Ophthalmology;  Laterality: Right;  CDE 10.31  . Cataract extraction w/phaco Left 08/17/2013    Procedure: CATARACT EXTRACTION PHACO AND INTRAOCULAR LENS PLACEMENT (IOC);  Surgeon: Tonny Branch, MD;   Location: [facility];  Service: Ophthalmology;  Laterality: Left;  CDE:9.03  . Dental surgery      History reviewed. No pertinent family history.  Social History:  reports that she quit smoking about 4 months ago. Her smoking use included Cigarettes. She has a 7.5 pack-year smoking history. She has never used smokeless tobacco. She reports that she does not drink alcohol or use illicit drugs.  Allergies:  Allergies  Allergen Reactions  . Tetracyclines & Related Anaphylaxis  . Banana Hives and Nausea And Vomiting  . Penicillins Rash    Has patient had a PCN reaction causing immediate rash, facial/tongue/throat swelling, SOB or lightheadedness with hypotension: No Has patient had a PCN reaction causing severe rash involving mucus membranes or skin necrosis: No Has patient had a PCN reaction that required hospitalization No Has patient had a PCN reaction occurring within the last 10 years: No If all of the above answers are "NO", then may proceed with Cephalosporin use.     Medications: I have reviewed the patient's current medications.  Results for orders placed or performed during the hospital encounter of 06/27/15 (from the past 48 hour(s))  CBC with Differential     Status: Abnormal   Collection Time: 06/27/15  8:20 PM  Result Value Ref Range   WBC 9.1 4.0 - 10.5 K/uL   RBC 4.45 3.87 - 5.11 MIL/uL   Hemoglobin 11.5 (L) 12.0 - 15.0 g/dL   HCT 35.6 (L) 36.0 - 46.0 %   MCV 80.0 78.0 -  100.0 fL   MCH 25.8 (L) 26.0 - 34.0 pg   MCHC 32.3 30.0 - 36.0 g/dL   RDW 13.6 11.5 - 15.5 %   Platelets 439 (H) 150 - 400 K/uL   Neutrophils Relative % 38 %   Neutro Abs 3.5 1.7 - 7.7 K/uL   Lymphocytes Relative 35 %   Lymphs Abs 3.2 0.7 - 4.0 K/uL   Monocytes Relative 7 %   Monocytes Absolute 0.6 0.1 - 1.0 K/uL   Eosinophils Relative 19 %   Eosinophils Absolute 1.7 (H) 0.0 - 0.7 K/uL   Basophils Relative 1 %   Basophils Absolute 0.1 0.0 - 0.1 K/uL  Comprehensive metabolic panel     Status:  Abnormal   Collection Time: 06/27/15  8:20 PM  Result Value Ref Range   Sodium 136 135 - 145 mmol/L   Potassium 3.4 (L) 3.5 - 5.1 mmol/L   Chloride 105 101 - 111 mmol/L   CO2 25 22 - 32 mmol/L   Glucose, Bld 179 (H) 65 - 99 mg/dL   BUN 21 (H) 6 - 20 mg/dL   Creatinine, Ser 1.44 (H) 0.44 - 1.00 mg/dL   Calcium 8.0 (L) 8.9 - 10.3 mg/dL   Total Protein 6.6 6.5 - 8.1 g/dL   Albumin 1.1 (L) 3.5 - 5.0 g/dL   AST 21 15 - 41 U/L   ALT 20 14 - 54 U/L   Alkaline Phosphatase 356 (H) 38 - 126 U/L   Total Bilirubin 0.4 0.3 - 1.2 mg/dL   GFR calc non Af Amer 37 (L) >60 mL/min   GFR calc Af Amer 43 (L) >60 mL/min    Comment: (NOTE) The eGFR has been calculated using the CKD EPI equation. This calculation has not been validated in all clinical situations. eGFR's persistently <60 mL/min signify possible Chronic Kidney Disease.    Anion gap 6 5 - 15  APTT     Status: None   Collection Time: 06/27/15  8:20 PM  Result Value Ref Range   aPTT 30 24 - 37 seconds  Protime-INR     Status: None   Collection Time: 06/27/15  8:20 PM  Result Value Ref Range   Prothrombin Time 13.5 11.6 - 15.2 seconds   INR 1.01 0.00 - 1.49  Glucose, capillary     Status: Abnormal   Collection Time: 06/27/15 11:24 PM  Result Value Ref Range   Glucose-Capillary 150 (H) 65 - 99 mg/dL   Comment 1 Notify RN    Comment 2 Document in Chart   MRSA PCR Screening     Status: None   Collection Time: 06/28/15  1:20 AM  Result Value Ref Range   MRSA by PCR NEGATIVE NEGATIVE    Comment:        The GeneXpert MRSA Assay (FDA approved for NASAL specimens only), is one component of a comprehensive MRSA colonization surveillance program. It is not intended to diagnose MRSA infection nor to guide or monitor treatment for MRSA infections.   Protein / creatinine ratio, urine     Status: Abnormal   Collection Time: 06/28/15  6:11 AM  Result Value Ref Range   Creatinine, Urine 325.05 mg/dL   Total Protein, Urine 3362 mg/dL     Comment: RESULTS CONFIRMED BY MANUAL DILUTION NO NORMAL RANGE ESTABLISHED FOR THIS TEST    Protein Creatinine Ratio 10.34 (H) 0.00 - 0.15 mg/mg[Cre]  Urinalysis, Routine w reflex microscopic (not at [facility])     Status: Abnormal   Collection  Time: 06/28/15  6:11 AM  Result Value Ref Range   Color, Urine YELLOW YELLOW   APPearance CLEAR CLEAR   Specific Gravity, Urine 1.020 1.005 - 1.030   pH 6.5 5.0 - 8.0   Glucose, UA 100 (A) NEGATIVE mg/dL   Hgb urine dipstick MODERATE (A) NEGATIVE   Bilirubin Urine SMALL (A) NEGATIVE   Ketones, ur TRACE (A) NEGATIVE mg/dL   Protein, ur >300 (A) NEGATIVE mg/dL   Nitrite NEGATIVE NEGATIVE   Leukocytes, UA NEGATIVE NEGATIVE  Urine microscopic-add on     Status: Abnormal   Collection Time: 06/28/15  6:11 AM  Result Value Ref Range   Squamous Epithelial / LPF TOO NUMEROUS TO COUNT (A) NONE SEEN   WBC, UA 0-5 0 - 5 WBC/hpf   RBC / HPF TOO NUMEROUS TO COUNT 0 - 5 RBC/hpf   Bacteria, UA MANY (A) NONE SEEN   Casts GRANULAR CAST (A) NEGATIVE  Basic metabolic panel     Status: Abnormal   Collection Time: 06/28/15  6:15 AM  Result Value Ref Range   Sodium 135 135 - 145 mmol/L   Potassium 3.0 (L) 3.5 - 5.1 mmol/L   Chloride 106 101 - 111 mmol/L   CO2 25 22 - 32 mmol/L   Glucose, Bld 114 (H) 65 - 99 mg/dL   BUN 21 (H) 6 - 20 mg/dL   Creatinine, Ser 1.24 (H) 0.44 - 1.00 mg/dL   Calcium 7.5 (L) 8.9 - 10.3 mg/dL   GFR calc non Af Amer 45 (L) >60 mL/min   GFR calc Af Amer 52 (L) >60 mL/min    Comment: (NOTE) The eGFR has been calculated using the CKD EPI equation. This calculation has not been validated in all clinical situations. eGFR's persistently <60 mL/min signify possible Chronic Kidney Disease.    Anion gap 4 (L) 5 - 15  CBC     Status: Abnormal   Collection Time: 06/28/15  6:15 AM  Result Value Ref Range   WBC 10.8 (H) 4.0 - 10.5 K/uL   RBC 4.09 3.87 - 5.11 MIL/uL   Hemoglobin 10.6 (L) 12.0 - 15.0 g/dL   HCT 32.8 (L) 36.0 - 46.0 %   MCV  80.2 78.0 - 100.0 fL   MCH 25.9 (L) 26.0 - 34.0 pg   MCHC 32.3 30.0 - 36.0 g/dL   RDW 13.6 11.5 - 15.5 %   Platelets 407 (H) 150 - 400 K/uL  Heparin level (unfractionated)     Status: None   Collection Time: 06/28/15  6:15 AM  Result Value Ref Range   Heparin Unfractionated 0.66 0.30 - 0.70 IU/mL    Comment:        IF HEPARIN RESULTS ARE BELOW EXPECTED VALUES, AND PATIENT DOSAGE HAS BEEN CONFIRMED, SUGGEST FOLLOW UP TESTING OF ANTITHROMBIN III LEVELS.   Glucose, capillary     Status: Abnormal   Collection Time: 06/28/15  7:39 AM  Result Value Ref Range   Glucose-Capillary 103 (H) 65 - 99 mg/dL    US Venous Img Lower Bilateral  06/27/2015  CLINICAL DATA:  Bilateral leg swelling, initial encounter EXAM: BILATERAL LOWER EXTREMITY VENOUS DOPPLER ULTRASOUND TECHNIQUE: Gray-scale sonography with graded compression, as well as color Doppler and duplex ultrasound were performed to evaluate the lower extremity deep venous systems from the level of the common femoral vein and including the common femoral, femoral, profunda femoral, popliteal and calf veins including the posterior tibial, peroneal and gastrocnemius veins when visible. The superficial great saphenous vein was  also interrogated. Spectral Doppler was utilized to evaluate flow at rest and with distal augmentation maneuvers in the common femoral, femoral and popliteal veins. COMPARISON:  None. FINDINGS: RIGHT LOWER EXTREMITY Common Femoral Vein: No evidence of thrombus. Normal compressibility, respiratory phasicity and response to augmentation. Saphenofemoral Junction: No evidence of thrombus. Normal compressibility and flow on color Doppler imaging. Profunda Femoral Vein: No evidence of thrombus. Normal compressibility and flow on color Doppler imaging. Femoral Vein: No evidence of thrombus. Normal compressibility, respiratory phasicity and response to augmentation. Popliteal Vein: No evidence of thrombus. Normal compressibility, respiratory  phasicity and response to augmentation. Calf Veins: Increased echogenicity is noted within the posterior tibial vein with decreased compressibility consistent with thrombus. Superficial Great Saphenous Vein: No evidence of thrombus. Normal compressibility and flow on color Doppler imaging. Venous Reflux:  None. Other Findings: Generalized subcutaneous edema is noted within the calf. LEFT LOWER EXTREMITY Common Femoral Vein: No evidence of thrombus. Normal compressibility, respiratory phasicity and response to augmentation. Saphenofemoral Junction: No evidence of thrombus. Normal compressibility and flow on color Doppler imaging. Profunda Femoral Vein: No evidence of thrombus. Normal compressibility and flow on color Doppler imaging. Femoral Vein: No evidence of thrombus. Normal compressibility, respiratory phasicity and response to augmentation. Popliteal Vein: No evidence of thrombus. Normal compressibility, respiratory phasicity and response to augmentation. Calf Veins: Increased echogenicity is noted within the posterior tibial vein with decreased compressibility consistent with thrombus. Superficial Great Saphenous Vein: No evidence of thrombus. Normal compressibility and flow on color Doppler imaging. Venous Reflux:  None. Other Findings: Generalized subcutaneous edema is noted within the calf. Multiple prominent lymph nodes are noted in the left groin consistent with reactive nodes. IMPRESSION: Bilateral posterior tibial vein thrombus. Electronically Signed   By: Inez Catalina M.D.   On: 06/27/2015 15:15    Review of Systems  Constitutional: Negative for fever and chills.  HENT: Positive for sore throat.   Respiratory: Negative for cough, sputum production and shortness of breath.   Cardiovascular: Positive for leg swelling. Negative for orthopnea.  Gastrointestinal: Positive for vomiting, abdominal pain and diarrhea. Negative for nausea and blood in stool.  Skin: Positive for itching and rash.    Blood pressure 132/77, pulse 70, temperature 98.2 F (36.8 C), temperature source Oral, resp. rate 18, height 5' 7.5" (1.715 m), weight 104.736 kg (230 lb 14.4 oz), SpO2 100 %. Physical Exam  Constitutional: She is oriented to person, place, and time. No distress.  Eyes: Left eye exhibits no discharge.  Neck: No JVD present.  Cardiovascular: Normal rate and regular rhythm.  Exam reveals no friction rub.   No murmur heard. Respiratory: No respiratory distress. She has no wheezes.  GI: She exhibits no distension. There is tenderness. There is no rebound.  Musculoskeletal: She exhibits no edema.  Neurological: She is alert and oriented to person, place, and time.  Skin: Skin is dry. Rash noted. There is erythema.    Assessment/Plan: Problem #1 acute kidney injury superimposed on chronic. This could be secondary to prerenal syndrome versus ATN. Her renal function is presently showing some improvement. Problem #2 possible chronic renal failure. Patient creatinine was 1.42 on 08/22/10. However her renal function seems to have improved and normalized in between. 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 Etiology could be secondary to diabetes/hypertension/vasculitis. Problem #3. Proteinuria: Presently she has 3300 mg of protein in the urine. This could be secondary to diabetes/vasculitis. Other etiologies such  as primary or secondary cause cannot be ruled out. Problem #4 skin rash: Long-standing. At this moment etiology not clear. According to the patient shows ANA negative. Patient with history of significant weight loss about 55 pounds, skin rash, some upper respiratory tract problems, abdominal pain, diarrhea. Vasculitis such as ANCA associated vasculitis, HSP. Problem #5 history of hypertension: Her blood pressure is reasonably controlled Problem #6 hypokalemia: Most likely from her diarrhea Problem #7 history of  diabetes Problem #8 anemia. Plan: We'll continue with IV hydration 2] we'll check sedimentation rate 3] we'll check anticardiolipin antibody and lupus anticoagulant 4] we'll check renal panel in the morning 5] agree with workup for vasculitis.    Shabazz Mckey S 06/28/2015, 10:44 AM

## 2015-06-28 NOTE — Progress Notes (Signed)
PROGRESS NOTE  Stephanie [name] HRC:[id number] DOB: 1950/03/11 DOA: 06/27/2015 PCP: [name], MD  Brief Narrative: 10 yof with medical history of DM2, HTN, FM, DJD, and cataracts presented after bilateral LE DVT was found on LE doppler as outpatient. She has been referred for admission.   Assessment/Plan: 1. Bilateral LE DVT. Continue IV heparin. Plan to transition to oral anticoagulation if no renal biopsy is pursued. We'll discuss with nephrology. 2. Severe neph syndrome/ renal insuff. In the setting of chronic skin rash, alopecia. Evaluation SLE/vasculitis. 3. AKI.  Improved. 4. Hypokalemia, replete. 5. Essential HTN, stable.  6. DM type 2. Stable. 7. Tobacco use.  8. Obesity unspecified   Change to Lovenox pending further nephrology recommendations. We'll hold off on oral anticoagulant until determined whether the patient will need renal biopsy or not.  Management of nephrotic syndrome, AKI per nephrology.  DVT prophylaxis: Lovenox Code Status: Full Family Communication: No family bedside.  Disposition Plan: Anticipate discharge in 1-2 days.   [name], MD  [facility] Direct contact:  --Via amion app OR  --[email or web address]; password [email or web address] and click  [email or web address] contact night coverage as above 06/28/2015, 9:01 AM  LOS: 1 day   Consultants:  Nephrology   Procedures:  None  Antimicrobials:  None  HPI/Subjective: Pain in legs has been intermittent for some time. Has abdominal pain for which she is scheduled to visit outpatient GI. Denies any difficulty eating or swallowing. Mild intermittent nausea. BLE have been swollen and tender for about one week.   Objective: Filed Vitals:   06/27/15 1900 06/27/15 2157 06/27/15 2318 06/28/15 0646  BP: 131/72 126/63 141/94 132/77  Pulse: 72 72 78 70  Temp:   98 F (36.7 C) 98.2 F (36.8 C)  TempSrc:   Oral Oral  Resp: 18 18 18 18   Height:   5' 7.5" (1.715 m)   Weight:   104.736 kg (230 lb 14.4 oz) 104.736 kg  (230 lb 14.4 oz)  SpO2: 100% 100% 100% 100%    Intake/Output Summary (Last 24 hours) at 06/28/15 0901 Last data filed at 06/28/15 0646  Gross per 24 hour  Intake      0 ml  Output    450 ml  Net   -450 ml     Filed Weights   06/27/15 1605 06/27/15 2318 06/28/15 0646  Weight: 103.42 kg (228 lb) 104.736 kg (230 lb 14.4 oz) 104.736 kg (230 lb 14.4 oz)    Exam:   Constitutional:  . Appears calm and comfortable Respiratory:  . CTA bilaterally, no w/r/r.  . Respiratory effort normal. No retractions or accessory muscle use Cardiovascular:  . RRR, no m/r/g . 2+ BLE extremity edema with tenderness.  . Telemetry SR  Abdomen:  . Abdomen appears normal; no tenderness Skin:  . Macular rash in BLE, bilateral arms, and abdomen.  Psychiatric:  . judgement and insight appear normal . Mental status o Mood, affect appropriate  I have personally reviewed following labs and imaging studies:  Potassium 3.0  Creatinine 1.24/BUN 21, improved.   Hemoglobin 10.6, stable.  No C. difficile toxin present  Bilateral low venous ultrasound noted  Scheduled Meds: . DULoxetine  30 mg Oral BID  . furosemide  20 mg Oral BID  . insulin aspart  0-20 Units Subcutaneous TID WC  . insulin aspart  0-5 Units Subcutaneous QHS  . insulin glargine  60 Units Subcutaneous BID  . levothyroxine  100 mcg Oral QAC breakfast  . lisinopril  10 mg Oral Daily  .  metoprolol  100 mg Oral BID  . pantoprazole  40 mg Oral Daily  . potassium chloride  40 mEq Oral BID  . pravastatin  40 mg Oral Daily  . sodium chloride flush  3 mL Intravenous Q12H  . sodium chloride flush  3 mL Intravenous Q12H  . vitamin C  1,000 mg Oral Daily  . vitamin E  400 Units Oral Daily   Continuous Infusions: . heparin 1,500 Units/hr (06/28/15 0118)    Active Problems:   Hypertension   Tobacco abuse   Diabetes ([place])   DVT (deep venous thrombosis), bilateral   Renal failure   Nephrotic range proteinuria   Maculopapular rash,  generalized   Alopecia   LOS: 1 day   Time spent 20 minutes   By signing my name below, I, Rennis Harding attest that this documentation has been prepared under the direction and in the presence of [name], MD Electronically signed: Rennis Harding  06/28/2015 1:45pm   I personally performed the services described in this documentation. All medical record entries made by the scribe were at my direction. I have reviewed the chart and agree that the record reflects my personal performance and is accurate and complete. [name], MD

## 2015-06-28 NOTE — Progress Notes (Signed)
ANTICOAGULATION CONSULT NOTE - Preliminary  Pharmacy Consult for heparin Indication: DVT  Allergies  Allergen Reactions  . Tetracyclines & Related Anaphylaxis  . Banana Hives and Nausea And Vomiting  . Penicillins Rash    Has patient had a PCN reaction causing immediate rash, facial/tongue/throat swelling, SOB or lightheadedness with hypotension: No Has patient had a PCN reaction causing severe rash involving mucus membranes or skin necrosis: No Has patient had a PCN reaction that required hospitalization No Has patient had a PCN reaction occurring within the last 10 years: No If all of the above answers are "NO", then may proceed with Cephalosporin use.     Patient Measurements: Height: 5' 7.5" (171.5 cm) Weight: 230 lb 14.4 oz (104.736 kg) IBW/kg (Calculated) : 62.75 HEPARIN DW (KG): 86.3   Vital Signs: Temp: 98.2 F (36.8 C) (05/12 0646) Temp Source: Oral (05/12 0646) BP: 132/77 mmHg (05/12 0646) Pulse Rate: 70 (05/12 0646)  Labs:  Recent Labs  06/27/15 2020 06/28/15 0615  HGB 11.5* 10.6*  HCT 35.6* 32.8*  PLT 439* 407*  APTT 30  --   LABPROT 13.5  --   INR 1.01  --   HEPARINUNFRC  --  0.66  CREATININE 1.44* 1.24*   Estimated Creatinine Clearance: 57.6 mL/min (by C-G formula based on Cr of 1.24).  Medical History: Past Medical History  Diagnosis Date  . Diabetes mellitus   . Hypertension   . Fibromyalgia   . Asthma   . PONV (postoperative nausea and vomiting)   . Hyperthyroidism   . Anemia   . H/O hiatal hernia   . GERD (gastroesophageal reflux disease)   . COPD (chronic obstructive pulmonary disease) ([place])   . Inner ear disease   . IBS (irritable bowel syndrome)   . Tachycardia     Medications:  Scheduled:  . DULoxetine  30 mg Oral BID  . furosemide  20 mg Oral BID  . insulin aspart  0-20 Units Subcutaneous TID WC  . insulin aspart  0-5 Units Subcutaneous QHS  . insulin glargine  60 Units Subcutaneous BID  . levothyroxine  100 mcg Oral QAC  breakfast  . lisinopril  10 mg Oral Daily  . metoprolol  100 mg Oral BID  . pantoprazole  40 mg Oral Daily  . pravastatin  40 mg Oral Daily  . sodium chloride flush  3 mL Intravenous Q12H  . sodium chloride flush  3 mL Intravenous Q12H  . vitamin C  1,000 mg Oral Daily  . vitamin E  400 Units Oral Daily   Infusions:  . heparin 1,500 Units/hr (06/28/15 0118)   PRN: sodium chloride, acetaminophen **OR** acetaminophen, albuterol, bisacodyl, diphenhydrAMINE, HYDROcodone-acetaminophen, ondansetron **OR** ondansetron (ZOFRAN) IV, polyethylene glycol, sodium chloride flush, traMADol Anti-infectives    None      Assessment: 65 yo female starting heparin for bilateral LE DVT. No bleeding, INR 1.01. Heparin level therapeutic at 0.66.   Goal of Therapy:  Heparin level 0.3-0.7 units/ml   Plan:  Continue heparin infusion at 1500 units/hr Check anti-Xa level daily while on heparin Continue to monitor H&H and platelets  Isac Sarna, BS Vena [place], BCPS Clinical Pharmacist Pager [phone] 06/28/2015,7:35 AM

## 2015-06-29 LAB — CBC
HCT: 33.9 % — ABNORMAL LOW (ref 36.0–46.0)
Hemoglobin: 10.6 g/dL — ABNORMAL LOW (ref 12.0–15.0)
MCH: 25.4 pg — AB (ref 26.0–34.0)
MCHC: 31.3 g/dL (ref 30.0–36.0)
MCV: 81.1 fL (ref 78.0–100.0)
PLATELETS: 458 10*3/uL — AB (ref 150–400)
RBC: 4.18 MIL/uL (ref 3.87–5.11)
RDW: 13.7 % (ref 11.5–15.5)
WBC: 8.7 10*3/uL (ref 4.0–10.5)

## 2015-06-29 LAB — HEPATITIS PANEL, ACUTE
HEP B S AG: NEGATIVE
Hep A IgM: NEGATIVE
Hep B C IgM: NEGATIVE

## 2015-06-29 LAB — C3 COMPLEMENT: C3 COMPLEMENT: 169 mg/dL — AB (ref 82–167)

## 2015-06-29 LAB — CARDIOLIPIN ANTIBODIES, IGG, IGM, IGA

## 2015-06-29 LAB — ANTISTREPTOLYSIN O TITER: ASO: 74 [IU]/mL (ref 0.0–200.0)

## 2015-06-29 LAB — C4 COMPLEMENT: Complement C4, Body Fluid: 47 mg/dL — ABNORMAL HIGH (ref 14–44)

## 2015-06-29 LAB — ANTI-DNA ANTIBODY, DOUBLE-STRANDED: ds DNA Ab: <1 IU/mL (ref 0–9)

## 2015-06-29 LAB — HEPARIN LEVEL (UNFRACTIONATED)
Heparin Unfractionated: 0.25 IU/mL — ABNORMAL LOW (ref 0.30–0.70)
Heparin Unfractionated: 0.36 IU/mL (ref 0.30–0.70)

## 2015-06-29 LAB — HIV ANTIBODY (ROUTINE TESTING W REFLEX): HIV Screen 4th Generation wRfx: NONREACTIVE

## 2015-06-29 MED ORDER — INSULIN GLARGINE 100 UNIT/ML ~~LOC~~ SOLN
30.0000 [IU] | Freq: Two times a day (BID) | SUBCUTANEOUS | Status: DC
  Administered 2015-06-30: 30 [IU] via SUBCUTANEOUS
  Filled 2015-06-29 (×4): qty 0.3

## 2015-06-29 MED ORDER — HEPARIN BOLUS VIA INFUSION
1250.0000 [IU] | Freq: Once | INTRAVENOUS | Status: AC
  Administered 2015-06-29: 1250 [IU] via INTRAVENOUS
  Filled 2015-06-29: qty 1250

## 2015-06-29 NOTE — Progress Notes (Signed)
[place] for heparin Indication: DVT  Allergies  Allergen Reactions  . Tetracyclines & Related Anaphylaxis  . Banana Hives and Nausea And Vomiting  . Penicillins Rash    Has patient had a PCN reaction causing immediate rash, facial/tongue/throat swelling, SOB or lightheadedness with hypotension: No Has patient had a PCN reaction causing severe rash involving mucus membranes or skin necrosis: No Has patient had a PCN reaction that required hospitalization No Has patient had a PCN reaction occurring within the last 10 years: No If all of the above answers are "NO", then may proceed with Cephalosporin use.     Patient Measurements: Height: 5' 7.5" (171.5 cm) Weight: 237 lb 6.4 oz (107.684 kg) IBW/kg (Calculated) : 62.75 HEPARIN DW (KG): 86.3   Vital Signs: Temp: 98 F (36.7 C) (05/13 1600) Temp Source: Oral (05/13 1600) BP: 124/65 mmHg (05/13 1600)  Labs:  Recent Labs  06/27/15 2020 06/28/15 0615 06/29/15 0632 06/29/15 1350  HGB 11.5* 10.6* 10.6*  --   HCT 35.6* 32.8* 33.9*  --   PLT 439* 407* 458*  --   APTT 30  --   --   --   LABPROT 13.5  --   --   --   INR 1.01  --   --   --   HEPARINUNFRC  --  0.66 0.25* 0.36  CREATININE 1.44* 1.24*  --   --    Estimated Creatinine Clearance: 58.5 mL/min (by C-G formula based on Cr of 1.24).  Medical History: Past Medical History  Diagnosis Date  . Diabetes mellitus   . Hypertension   . Fibromyalgia   . Asthma   . PONV (postoperative nausea and vomiting)   . Hyperthyroidism   . Anemia   . H/O hiatal hernia   . GERD (gastroesophageal reflux disease)   . COPD (chronic obstructive pulmonary disease) ([place])   . Inner ear disease   . IBS (irritable bowel syndrome)   . Tachycardia     Medications:  Scheduled:  . DULoxetine  30 mg Oral BID  . feeding supplement  1 Container Oral TID BM  . furosemide  20 mg Oral BID  . insulin aspart  0-20 Units Subcutaneous TID WC  . insulin  aspart  0-5 Units Subcutaneous QHS  . insulin glargine  30 Units Subcutaneous BID  . levothyroxine  100 mcg Oral QAC breakfast  . metoprolol  100 mg Oral BID  . pantoprazole  40 mg Oral Daily  . pravastatin  40 mg Oral Daily  . sodium chloride flush  3 mL Intravenous Q12H  . sodium chloride flush  3 mL Intravenous Q12H  . vitamin C  1,000 mg Oral Daily  . vitamin E  400 Units Oral Daily   Infusions:  . sodium chloride 100 mL/hr at 06/29/15 1931  . heparin 1,650 Units/hr (06/29/15 0808)   PRN: sodium chloride, acetaminophen **OR** acetaminophen, albuterol, bisacodyl, diphenhydrAMINE, HYDROcodone-acetaminophen, ondansetron **OR** ondansetron (ZOFRAN) IV, polyethylene glycol, sodium chloride flush, traMADol Anti-infectives    None      Assessment: 65 yo female starting heparin for bilateral LE DVT. No bleeding, INR 1.01. Heparin level therapeutic at 0.36 this AM.   Goal of Therapy:  Heparin level 0.3-0.7 units/ml   Plan:  Continue heparin infusion at 1650 units/hr Heparin level in 6 hours Check anti-Xa level daily while on heparin Continue to monitor H&H and platelets  Isac Sarna, BS [place], BCPS Clinical Pharmacist Pager [phone] 06/29/2015,9:33 PM

## 2015-06-29 NOTE — Progress Notes (Addendum)
[place] for heparin Indication: DVT  Allergies  Allergen Reactions  . Tetracyclines & Related Anaphylaxis  . Banana Hives and Nausea And Vomiting  . Penicillins Rash    Has patient had a PCN reaction causing immediate rash, facial/tongue/throat swelling, SOB or lightheadedness with hypotension: No Has patient had a PCN reaction causing severe rash involving mucus membranes or skin necrosis: No Has patient had a PCN reaction that required hospitalization No Has patient had a PCN reaction occurring within the last 10 years: No If all of the above answers are "NO", then may proceed with Cephalosporin use.     Patient Measurements: Height: 5' 7.5" (171.5 cm) Weight: 237 lb 6.4 oz (107.684 kg) IBW/kg (Calculated) : 62.75 HEPARIN DW (KG): 86.3   Vital Signs: Temp: 97 F (36.1 C) (05/13 0604) Temp Source: Oral (05/13 0604) BP: 157/77 mmHg (05/13 0604)  Labs:  Recent Labs  06/27/15 2020 06/28/15 0615 06/29/15 0632  HGB 11.5* 10.6*  --   HCT 35.6* 32.8*  --   PLT 439* 407*  --   APTT 30  --   --   LABPROT 13.5  --   --   INR 1.01  --   --   HEPARINUNFRC  --  0.66 0.25*  CREATININE 1.44* 1.24*  --    Estimated Creatinine Clearance: 58.5 mL/min (by C-G formula based on Cr of 1.24).  Medical History: Past Medical History  Diagnosis Date  . Diabetes mellitus   . Hypertension   . Fibromyalgia   . Asthma   . PONV (postoperative nausea and vomiting)   . Hyperthyroidism   . Anemia   . H/O hiatal hernia   . GERD (gastroesophageal reflux disease)   . COPD (chronic obstructive pulmonary disease) ([place])   . Inner ear disease   . IBS (irritable bowel syndrome)   . Tachycardia     Medications:  Scheduled:  . DULoxetine  30 mg Oral BID  . feeding supplement  1 Container Oral TID BM  . furosemide  20 mg Oral BID  . heparin  1,250 Units Intravenous Once  . insulin aspart  0-20 Units Subcutaneous TID WC  . insulin aspart  0-5 Units  Subcutaneous QHS  . insulin glargine  60 Units Subcutaneous BID  . levothyroxine  100 mcg Oral QAC breakfast  . metoprolol  100 mg Oral BID  . pantoprazole  40 mg Oral Daily  . pravastatin  40 mg Oral Daily  . sodium chloride flush  3 mL Intravenous Q12H  . sodium chloride flush  3 mL Intravenous Q12H  . vitamin C  1,000 mg Oral Daily  . vitamin E  400 Units Oral Daily   Infusions:  . sodium chloride 100 mL/hr at 06/28/15 2316  . heparin 1,500 Units/hr (06/28/15 1339)   PRN: sodium chloride, acetaminophen **OR** acetaminophen, albuterol, bisacodyl, diphenhydrAMINE, HYDROcodone-acetaminophen, ondansetron **OR** ondansetron (ZOFRAN) IV, polyethylene glycol, sodium chloride flush, traMADol Anti-infectives    None      Assessment: 65 yo female starting heparin for bilateral LE DVT. No bleeding, INR 1.01. Heparin level sub- therapeutic at 0.25 this AM. Will adjust  Goal of Therapy:  Heparin level 0.3-0.7 units/ml   Plan:  Bolus heparin 1250units then  increase heparin infusion at 1650 units/hr Heparin level in 6 hours Check anti-Xa level daily while on heparin Continue to monitor H&H and platelets  Isac Sarna, BS [place], BCPS Clinical Pharmacist Pager [phone] 06/29/2015,7:49 AM

## 2015-06-29 NOTE — Progress Notes (Signed)
Subjective: Interval History: has complaints of some diarrhea but seems to be getting better. Her leg pain is also improving. She didn't have any nausea or vomiting..  Objective: Vital signs in last 24 hours: Temp:  [97 F (36.1 C)-98.4 F (36.9 C)] 97 F (36.1 C) (05/13 0604) Pulse Rate:  [77] 77 (05/12 1450) Resp:  [16-18] 16 (05/12 2100) BP: (111-157)/(57-90) 157/77 mmHg (05/13 0604) SpO2:  [100 %] 100 % (05/13 0604) Weight:  [107.684 kg (237 lb 6.4 oz)] 107.684 kg (237 lb 6.4 oz) (05/13 0604) Weight change: 4.264 kg (9 lb 6.4 oz)  Intake/Output from previous day:   Intake/Output this shift:    General appearance: alert, cooperative and no distress Resp: clear to auscultation bilaterally Cardio: regular rate and rhythm, S1, S2 normal, no murmur, click, rub or gallop GI: Patient with slight tenderness on palpation. No rebound tenderness and positive bowel sound Extremities: extremities normal, atraumatic, no cyanosis or edema  Lab Results:  Recent Labs  06/28/15 0615 06/29/15 0632  WBC 10.8* 8.7  HGB 10.6* 10.6*  HCT 32.8* 33.9*  PLT 407* 458*   BMET:  Recent Labs  06/27/15 2020 06/28/15 0615  NA 136 135  K 3.4* 3.0*  CL 105 106  CO2 25 25  GLUCOSE 179* 114*  BUN 21* 21*  CREATININE 1.44* 1.24*  CALCIUM 8.0* 7.5*   No results for input(s): PTH in the last 72 hours. Iron Studies: No results for input(s): IRON, TIBC, TRANSFERRIN, FERRITIN in the last 72 hours.  Studies/Results: US Venous Img Lower Bilateral  06/27/2015  CLINICAL DATA:  Bilateral leg swelling, initial encounter EXAM: BILATERAL LOWER EXTREMITY VENOUS DOPPLER ULTRASOUND TECHNIQUE: Gray-scale sonography with graded compression, as well as color Doppler and duplex ultrasound were performed to evaluate the lower extremity deep venous systems from the level of the common femoral vein and including the common femoral, femoral, profunda femoral, popliteal and calf veins including the posterior tibial,  peroneal and gastrocnemius veins when visible. The superficial great saphenous vein was also interrogated. Spectral Doppler was utilized to evaluate flow at rest and with distal augmentation maneuvers in the common femoral, femoral and popliteal veins. COMPARISON:  None. FINDINGS: RIGHT LOWER EXTREMITY Common Femoral Vein: No evidence of thrombus. Normal compressibility, respiratory phasicity and response to augmentation. Saphenofemoral Junction: No evidence of thrombus. Normal compressibility and flow on color Doppler imaging. Profunda Femoral Vein: No evidence of thrombus. Normal compressibility and flow on color Doppler imaging. Femoral Vein: No evidence of thrombus. Normal compressibility, respiratory phasicity and response to augmentation. Popliteal Vein: No evidence of thrombus. Normal compressibility, respiratory phasicity and response to augmentation. Calf Veins: Increased echogenicity is noted within the posterior tibial vein with decreased compressibility consistent with thrombus. Superficial Great Saphenous Vein: No evidence of thrombus. Normal compressibility and flow on color Doppler imaging. Venous Reflux:  None. Other Findings: Generalized subcutaneous edema is noted within the calf. LEFT LOWER EXTREMITY Common Femoral Vein: No evidence of thrombus. Normal compressibility, respiratory phasicity and response to augmentation. Saphenofemoral Junction: No evidence of thrombus. Normal compressibility and flow on color Doppler imaging. Profunda Femoral Vein: No evidence of thrombus. Normal compressibility and flow on color Doppler imaging. Femoral Vein: No evidence of thrombus. Normal compressibility, respiratory phasicity and response to augmentation. Popliteal Vein: No evidence of thrombus. Normal compressibility, respiratory phasicity and response to augmentation. Calf Veins: Increased echogenicity is noted within the posterior tibial vein with decreased compressibility consistent with thrombus.  Superficial Great Saphenous Vein: No evidence of thrombus. Normal compressibility and flow  on color Doppler imaging. Venous Reflux:  None. Other Findings: Generalized subcutaneous edema is noted within the calf. Multiple prominent lymph nodes are noted in the left groin consistent with reactive nodes. IMPRESSION: Bilateral posterior tibial vein thrombus. Electronically Signed   By: Inez Catalina M.D.   On: 06/27/2015 15:15    I have reviewed the patient's current medications.  Assessment/Plan: Problem #1 acute kidney injury: Her renal function continued to improve. Presently she is nonoliguric. Problem #2 hypokalemia: Patient is on potassium supplement Problem #3 history of diarrhea, nausea vomiting. Patient presently is positive for C. difficile colonization but no sign of infection. Etiology for her diarrhea is not clear. Patient is improving. Problem #4 hypertension: Her blood pressure is reasonably controlled Problem #5 nephrotic range proteinuria: Workup is pending. Problem #6 possible vasculitis. Problem #7 history of DVT she is on heparin Problem #8 history of diabetes Plan: We'll continue his hydration Recheck renal panel in the morning.    LOS: 2 days   Dondi Aime S 06/29/2015,9:30 AM

## 2015-06-29 NOTE — Progress Notes (Signed)
PROGRESS NOTE  Stephanie [name]:[id number] DOB: 01/21/1951 DOA: 06/27/2015 PCP: [name], MD  Brief Narrative: 65 yof with medical history of DM2, HTN, FM, DJD, and cataracts presented after bilateral LE DVT was found on LE doppler as outpatient. She has been referred for admission.   Assessment/Plan: 1. Bilateral LE DVT. Asymptomatic. Continue IV heparin. Plan to transition to oral anticoagulation if no renal biopsy is pursued.  2. Severe neph syndrome/ renal insuff. In the setting of chronic skin rash, alopecia. Evaluating for connective tissue disease. 3. AKI. Management per nephrology. 4. Subacute diarrhea, C. difficile screen equivocal. She has had antibiotics last month but also has a history of IBS. She has no leukocytosis and is afebrile. Will monitor clinically. Do not suspect active C. difficile at this point. 5. Essential HTN, stable.  6. DM type 2. Remains stable. 7. Tobacco use.  8. Obesity unspecified   Overall seems to be improving with decreasing edema. We'll continue heparin, if no biopsies perceivable change to oral anticoagulation.  Anticipate discharge when cleared by nephrology  DVT prophylaxis: Lovenox Code Status: Full Family Communication: No family bedside.  Disposition Plan: Anticipate discharge in 1-2 days.   [name], MD  [facility] Direct contact:  --Via amion app OR  --[email or web address]; password [email or web address] and click  [email or web address] contact night coverage as above 06/29/2015, 7:20 AM  LOS: 2 days   Consultants:  Nephrology   Procedures:  None  Antimicrobials:  None  HPI/Subjective: Feeling better, less lower extremity edema. Reports some ongoing diarrhea which is going on for some time. Reports a history of IBS. She had antibiotics last in April for sinusitis.  Objective: Filed Vitals:   06/28/15 0646 06/28/15 1450 06/28/15 2100 06/29/15 0604  BP: 132/77 126/57 111/90 157/77  Pulse: 70 77    Temp: 98.2 F (36.8 C) 98.4 F  (36.9 C) 97.9 F (36.6 C) 97 F (36.1 C)  TempSrc: Oral Oral Oral Oral  Resp: 18 18 16    Height:      Weight: 104.736 kg (230 lb 14.4 oz)   107.684 kg (237 lb 6.4 oz)  SpO2: 100% 100% 100% 100%   No intake or output data in the 24 hours ending 06/29/15 0720   Filed Weights   06/27/15 2318 06/28/15 0646 06/29/15 0604  Weight: 104.736 kg (230 lb 14.4 oz) 104.736 kg (230 lb 14.4 oz) 107.684 kg (237 lb 6.4 oz)    Exam:   Constitutional:  Appears calm and comfortable Respiratory:  CTA bilaterally, no w/r/r.  Respiratory effort normal. No retractions or accessory muscle use Cardiovascular:  RRR, no m/r/g 2+ bilateral lower extremity edema  Telemetry SR Skin:  Rash noted over arms and legs as well as on back Psychiatric:  judgement and insight appear normal Mental status Mood, affect appropriate  I have personally reviewed following labs and imaging studies:  Hemoglobin stable 10.6, WBC and has normalized  Scheduled Meds: . DULoxetine  30 mg Oral BID  . feeding supplement  1 Container Oral TID BM  . furosemide  20 mg Oral BID  . insulin aspart  0-20 Units Subcutaneous TID WC  . insulin aspart  0-5 Units Subcutaneous QHS  . insulin glargine  60 Units Subcutaneous BID  . levothyroxine  100 mcg Oral QAC breakfast  . metoprolol  100 mg Oral BID  . pantoprazole  40 mg Oral Daily  . pravastatin  40 mg Oral Daily  . sodium chloride flush  3 mL Intravenous Q12H  . sodium chloride  flush  3 mL Intravenous Q12H  . vitamin C  1,000 mg Oral Daily  . vitamin E  400 Units Oral Daily   Continuous Infusions: . sodium chloride 100 mL/hr at 06/28/15 2316  . heparin 1,500 Units/hr (06/28/15 1339)    Active Problems:   Hypertension   Tobacco abuse   Diabetes ([place])   DVT (deep venous thrombosis), bilateral   Renal failure   Nephrotic range proteinuria   Maculopapular rash, generalized   Alopecia   LOS: 2 days   Time spent 15 minutes   By signing my name below, I,  Rennis Harding attest that this documentation has been prepared under the direction and in the presence of [name], MD Electronically signed: Rennis Harding  06/29/2015   I personally performed the services described in this documentation. All medical record entries made by the scribe were at my direction. I have reviewed the chart and agree that the record reflects my personal performance and is accurate and complete. [name], MD

## 2015-06-29 NOTE — Progress Notes (Signed)
ANTICOAGULATION CONSULT NOTE - Preliminary  Pharmacy Consult for heparin Indication: DVT  Allergies  Allergen Reactions  . Tetracyclines & Related Anaphylaxis  . Banana Hives and Nausea And Vomiting  . Penicillins Rash    Has patient had a PCN reaction causing immediate rash, facial/tongue/throat swelling, SOB or lightheadedness with hypotension: No Has patient had a PCN reaction causing severe rash involving mucus membranes or skin necrosis: No Has patient had a PCN reaction that required hospitalization No Has patient had a PCN reaction occurring within the last 10 years: No If all of the above answers are "NO", then may proceed with Cephalosporin use.     Patient Measurements: Height: 5' 7.5" (171.5 cm) Weight: 237 lb 6.4 oz (107.684 kg) IBW/kg (Calculated) : 62.75 HEPARIN DW (KG): 86.3   Vital Signs: Temp: 97 F (36.1 C) (05/13 0604) Temp Source: Oral (05/13 0604) BP: 157/77 mmHg (05/13 0604)  Labs:  Recent Labs  06/27/15 2020 06/28/15 0615 06/29/15 0632  HGB 11.5* 10.6*  --   HCT 35.6* 32.8*  --   PLT 439* 407*  --   APTT 30  --   --   LABPROT 13.5  --   --   INR 1.01  --   --   HEPARINUNFRC  --  0.66 0.25*  CREATININE 1.44* 1.24*  --    Estimated Creatinine Clearance: 58.5 mL/min (by C-G formula based on Cr of 1.24).  Medical History: Past Medical History  Diagnosis Date  . Diabetes mellitus   . Hypertension   . Fibromyalgia   . Asthma   . PONV (postoperative nausea and vomiting)   . Hyperthyroidism   . Anemia   . H/O hiatal hernia   . GERD (gastroesophageal reflux disease)   . COPD (chronic obstructive pulmonary disease) ([place])   . Inner ear disease   . IBS (irritable bowel syndrome)   . Tachycardia     Medications:  Scheduled:  . DULoxetine  30 mg Oral BID  . feeding supplement  1 Container Oral TID BM  . furosemide  20 mg Oral BID  . insulin aspart  0-20 Units Subcutaneous TID WC  . insulin aspart  0-5 Units Subcutaneous QHS  . insulin  glargine  60 Units Subcutaneous BID  . levothyroxine  100 mcg Oral QAC breakfast  . metoprolol  100 mg Oral BID  . pantoprazole  40 mg Oral Daily  . pravastatin  40 mg Oral Daily  . sodium chloride flush  3 mL Intravenous Q12H  . sodium chloride flush  3 mL Intravenous Q12H  . vitamin C  1,000 mg Oral Daily  . vitamin E  400 Units Oral Daily   Infusions:  . sodium chloride 100 mL/hr at 06/28/15 2316  . heparin 1,500 Units/hr (06/28/15 1339)   PRN: sodium chloride, acetaminophen **OR** acetaminophen, albuterol, bisacodyl, diphenhydrAMINE, HYDROcodone-acetaminophen, ondansetron **OR** ondansetron (ZOFRAN) IV, polyethylene glycol, sodium chloride flush, traMADol Anti-infectives    None      Assessment: 65 yo female starting heparin for bilateral LE DVT. No bleeding, INR 1.01. Heparin level therapeutic at 0.66.   Goal of Therapy:  Heparin level 0.3-0.7 units/ml   Plan:  Bolus heparin 1250units then  increase heparin infusion at 1650 units/hr Heparin level in 6 hours Check anti-Xa level daily while on heparin Continue to monitor H&H and platelets  Isac Sarna, BS [place], BCPS Clinical Pharmacist Pager [phone] 06/29/2015,7:45 AM

## 2015-06-30 DIAGNOSIS — N179 Acute kidney failure, unspecified: Secondary | ICD-10-CM

## 2015-06-30 LAB — RENAL FUNCTION PANEL
Albumin: 1.1 g/dL — ABNORMAL LOW (ref 3.5–5.0)
Anion gap: 3 — ABNORMAL LOW (ref 5–15)
BUN: 13 mg/dL (ref 6–20)
CHLORIDE: 108 mmol/L (ref 101–111)
CO2: 23 mmol/L (ref 22–32)
CREATININE: 0.85 mg/dL (ref 0.44–1.00)
Calcium: 7.6 mg/dL — ABNORMAL LOW (ref 8.9–10.3)
GFR calc Af Amer: >60 mL/min (ref >60)
GFR calc non Af Amer: >60 mL/min (ref >60)
GLUCOSE: 73 mg/dL (ref 65–99)
POTASSIUM: 2.9 mmol/L — AB (ref 3.5–5.1)
Phosphorus: 3.5 mg/dL (ref 2.5–4.6)
Sodium: 134 mmol/L — ABNORMAL LOW (ref 135–145)

## 2015-06-30 LAB — CBC
HEMATOCRIT: 36 % (ref 36.0–46.0)
Hemoglobin: 11.5 g/dL — ABNORMAL LOW (ref 12.0–15.0)
MCH: 25.7 pg — AB (ref 26.0–34.0)
MCHC: 31.9 g/dL (ref 30.0–36.0)
MCV: 80.4 fL (ref 78.0–100.0)
PLATELETS: 477 10*3/uL — AB (ref 150–400)
RBC: 4.48 MIL/uL (ref 3.87–5.11)
RDW: 13.7 % (ref 11.5–15.5)
WBC: 11.6 10*3/uL — AB (ref 4.0–10.5)

## 2015-06-30 LAB — HEPARIN LEVEL (UNFRACTIONATED): Heparin Unfractionated: 0.72 IU/mL — ABNORMAL HIGH (ref 0.30–0.70)

## 2015-06-30 MED ORDER — INSULIN GLARGINE 100 UNIT/ML ~~LOC~~ SOLN: 10.0000 [IU] | Freq: Two times a day (BID) | SUBCUTANEOUS | Status: DC

## 2015-06-30 MED ORDER — INSULIN ASPART 100 UNIT/ML ~~LOC~~ SOLN
0.0000 [IU] | Freq: Three times a day (TID) | SUBCUTANEOUS | Status: DC
  Administered 2015-07-01: 3 [IU] via SUBCUTANEOUS
  Administered 2015-07-01: 1 [IU] via SUBCUTANEOUS
  Administered 2015-07-02 (×2): 2 [IU] via SUBCUTANEOUS

## 2015-06-30 MED ORDER — VANCOMYCIN 50 MG/ML ORAL SOLUTION
125.0000 mg | Freq: Four times a day (QID) | ORAL | Status: DC
  Administered 2015-06-30 – 2015-07-02 (×9): 125 mg via ORAL
  Filled 2015-06-30 (×13): qty 2.5

## 2015-06-30 MED ORDER — APIXABAN 5 MG PO TABS
10.0000 mg | ORAL_TABLET | Freq: Two times a day (BID) | ORAL | Status: DC
  Administered 2015-06-30 – 2015-07-02 (×5): 10 mg via ORAL
  Filled 2015-06-30 (×5): qty 2

## 2015-06-30 MED ORDER — FUROSEMIDE 10 MG/ML IJ SOLN
40.0000 mg | Freq: Two times a day (BID) | INTRAMUSCULAR | Status: DC
  Administered 2015-06-30: 40 mg via INTRAVENOUS
  Filled 2015-06-30 (×2): qty 4

## 2015-06-30 MED ORDER — APIXABAN 5 MG PO TABS: 5.0000 mg | ORAL_TABLET | Freq: Two times a day (BID) | ORAL | Status: DC

## 2015-06-30 MED ORDER — INSULIN GLARGINE 100 UNIT/ML ~~LOC~~ SOLN
10.0000 [IU] | Freq: Every day | SUBCUTANEOUS | Status: DC
  Filled 2015-06-30 (×2): qty 0.1

## 2015-06-30 MED ORDER — POTASSIUM CHLORIDE CRYS ER 20 MEQ PO TBCR
40.0000 meq | EXTENDED_RELEASE_TABLET | Freq: Two times a day (BID) | ORAL | Status: DC
  Administered 2015-06-30 (×2): 40 meq via ORAL
  Filled 2015-06-30 (×3): qty 2

## 2015-06-30 NOTE — Progress Notes (Signed)
[place] for Apixaban Indication: DVT  Allergies  Allergen Reactions  . Tetracyclines & Related Anaphylaxis  . Banana Hives and Nausea And Vomiting  . Penicillins Rash    Has patient had a PCN reaction causing immediate rash, facial/tongue/throat swelling, SOB or lightheadedness with hypotension: No Has patient had a PCN reaction causing severe rash involving mucus membranes or skin necrosis: No Has patient had a PCN reaction that required hospitalization No Has patient had a PCN reaction occurring within the last 10 years: No If all of the above answers are "NO", then may proceed with Cephalosporin use.     Patient Measurements: Height: 5' 7.5" (171.5 cm) Weight: 243 lb 1.6 oz (110.269 kg) IBW/kg (Calculated) : 62.75 HEPARIN DW (KG): 86.3   Vital Signs: Temp: 98 F (36.7 C) (05/14 0609) Temp Source: Oral (05/14 0609) BP: 146/72 mmHg (05/14 0609) Pulse Rate: 79 (05/14 0609)  Labs:  Recent Labs  06/27/15 2020  06/28/15 0615 06/29/15 [id number] 06/29/15 1350 06/30/15 0619  HGB 11.5*  --  10.6* 10.6*  --  11.5*  HCT 35.6*  --  32.8* 33.9*  --  36.0  PLT 439*  --  407* 458*  --  477*  APTT 30  --   --   --   --   --   LABPROT 13.5  --   --   --   --   --   INR 1.01  --   --   --   --   --   HEPARINUNFRC  --   < > 0.66 0.25* 0.36 0.72*  CREATININE 1.44*  --  1.24*  --   --  0.85  < > = values in this interval not displayed. Estimated Creatinine Clearance: 86.3 mL/min (by C-G formula based on Cr of 0.85).  Medical History: Past Medical History  Diagnosis Date  . Diabetes mellitus   . Hypertension   . Fibromyalgia   . Asthma   . PONV (postoperative nausea and vomiting)   . Hyperthyroidism   . Anemia   . H/O hiatal hernia   . GERD (gastroesophageal reflux disease)   . COPD (chronic obstructive pulmonary disease) ([place])   . Inner ear disease   . IBS (irritable bowel syndrome)   . Tachycardia     Medications:  Scheduled:  .  apixaban  10 mg Oral BID   Followed by  . [START ON 07/06/2015] apixaban  5 mg Oral BID  . DULoxetine  30 mg Oral BID  . feeding supplement  1 Container Oral TID BM  . furosemide  40 mg Intravenous BID  . insulin aspart  0-9 Units Subcutaneous TID WC  . [START ON 07/01/2015] insulin glargine  10 Units Subcutaneous Daily  . levothyroxine  100 mcg Oral QAC breakfast  . metoprolol  100 mg Oral BID  . pantoprazole  40 mg Oral Daily  . potassium chloride  40 mEq Oral BID  . pravastatin  40 mg Oral Daily  . sodium chloride flush  3 mL Intravenous Q12H  . sodium chloride flush  3 mL Intravenous Q12H  . vancomycin  125 mg Oral QID  . vitamin C  1,000 mg Oral Daily  . vitamin E  400 Units Oral Daily   Infusions:    PRN: sodium chloride, acetaminophen **OR** acetaminophen, albuterol, bisacodyl, diphenhydrAMINE, HYDROcodone-acetaminophen, ondansetron **OR** ondansetron (ZOFRAN) IV, polyethylene glycol, sodium chloride flush, traMADol Anti-infectives    Start     Dose/Rate Route  Frequency Ordered Stop   06/30/15 1400  vancomycin (VANCOCIN) 50 mg/mL oral solution 125 mg     125 mg Oral 4 times daily 06/30/15 1228 07/14/15 1359      Assessment: 65 yo female with bilateral LE DVT. No bleeding, INR 1.01. Hemoglobin stable. Heparin infusion will be transitioned to apixaban.   Goal of Therapy:  Monitor platelets by anticoagulation protocol: Yes   Plan:  Discontinue heparin Start apixaban 10mg  po bid for 6 days then decrease to 5mg  po BID(received heparin for about 36 hours) Continue to monitor H&H and platelets Educate patient on apixaban  Isac Sarna, BS [place], BCPS Clinical Pharmacist Pager [phone] 06/30/2015,1:03 PM

## 2015-06-30 NOTE — Progress Notes (Addendum)
Subjective: Interval History: Patient presently denies any difficulty breathing. Her diarrhea is better. She doesn't have any abdominal pain.  Objective: Vital signs in last 24 hours: Temp:  [97.8 F (36.6 C)-98 F (36.7 C)] 98 F (36.7 C) (05/14 0609) Pulse Rate:  [79-83] 79 (05/14 0609) Resp:  [20] 20 (05/14 0609) BP: (124-146)/(61-72) 146/72 mmHg (05/14 0609) SpO2:  [100 %] 100 % (05/14 0609) Weight:  [110.269 kg (243 lb 1.6 oz)] 110.269 kg (243 lb 1.6 oz) (05/14 0609) Weight change: 2.586 kg (5 lb 11.2 oz)  Intake/Output from previous day: 05/13 0701 - 05/14 0700 In: 240 [P.O.:240] Out: 500 [Urine:500] Intake/Output this shift:    Generally patient is alert and in no apparent distress Chest decreased breath sound otherwise seems to be clear Heart exam revealed regular rate and rhythm no murmur Extremities: She has 1-2+ edema bilaterally  Lab Results:  Recent Labs  06/29/15 0632 06/30/15 0619  WBC 8.7 11.6*  HGB 10.6* 11.5*  HCT 33.9* 36.0  PLT 458* 477*   BMET:   Recent Labs  06/28/15 0615 06/30/15 0619  NA 135 134*  K 3.0* 2.9*  CL 106 108  CO2 25 23  GLUCOSE 114* 73  BUN 21* 13  CREATININE 1.24* 0.85  CALCIUM 7.5* 7.6*   No results for input(s): PTH in the last 72 hours. Iron Studies: No results for input(s): IRON, TIBC, TRANSFERRIN, FERRITIN in the last 72 hours.  Studies/Results: No results found.  I have reviewed the patient's current medications.  Assessment/Plan: Problem #1 acute kidney injury: Renal function has recovered. Problem #2 hypokalemia: Patient is on potassium supplement. Her potassium is 2.9 is still declining. Problem #3 history of diarrhea, nausea vomiting. Patient presently is positive for C. difficile colonization but no sign of infection. Etiology for her diarrhea is not clear. Patient is improving. Problem #4 hypertension: Her blood pressure is reasonably controlled Problem #5 nephrotic range proteinuria: Hepatitis C  antibodies negative, HIV, has a DNA is also negative. Other workup presently spending. Problem #6 possible vasculitis. Problem #7 history of DVT she is on heparin Problem #8 history of diabetes: Her blood sugar is reasonably controlled Plan: 1]We'll DC IV fluid 2] will check 24-hour urine for protein and immunoelectrophoresis 3] will change her Lasix to 40 mg IV twice a day 4] Recheck renal panel in the morning.  5] will start patient on KCl 40 mEq by mouth twice a day.   LOS: 3 days   Briyah Wheelwright S 06/30/2015,9:21 AM

## 2015-06-30 NOTE — Progress Notes (Signed)
[facility] for heparin Indication: DVT  Allergies  Allergen Reactions  . Tetracyclines & Related Anaphylaxis  . Banana Hives and Nausea And Vomiting  . Penicillins Rash    Has patient had a PCN reaction causing immediate rash, facial/tongue/throat swelling, SOB or lightheadedness with hypotension: No Has patient had a PCN reaction causing severe rash involving mucus membranes or skin necrosis: No Has patient had a PCN reaction that required hospitalization No Has patient had a PCN reaction occurring within the last 10 years: No If all of the above answers are "NO", then may proceed with Cephalosporin use.     Patient Measurements: Height: 5' 7.5" (171.5 cm) Weight: 243 lb 1.6 oz (110.269 kg) IBW/kg (Calculated) : 62.75 HEPARIN DW (KG): 86.3   Vital Signs: Temp: 98 F (36.7 C) (05/14 0609) Temp Source: Oral (05/14 0609) BP: 146/72 mmHg (05/14 0609) Pulse Rate: 79 (05/14 0609)  Labs:  Recent Labs  06/27/15 2020  06/28/15 0615 06/29/15 [id number] 06/29/15 1350 06/30/15 0619  HGB 11.5*  --  10.6* 10.6*  --  11.5*  HCT 35.6*  --  32.8* 33.9*  --  36.0  PLT 439*  --  407* 458*  --  477*  APTT 30  --   --   --   --   --   LABPROT 13.5  --   --   --   --   --   INR 1.01  --   --   --   --   --   HEPARINUNFRC  --   < > 0.66 0.25* 0.36 0.72*  CREATININE 1.44*  --  1.24*  --   --   --   < > = values in this interval not displayed. Estimated Creatinine Clearance: 59.2 mL/min (by C-G formula based on Cr of 1.24).  Medical History: Past Medical History  Diagnosis Date  . Diabetes mellitus   . Hypertension   . Fibromyalgia   . Asthma   . PONV (postoperative nausea and vomiting)   . Hyperthyroidism   . Anemia   . H/O hiatal hernia   . GERD (gastroesophageal reflux disease)   . COPD (chronic obstructive pulmonary disease) ([place])   . Inner ear disease   . IBS (irritable bowel syndrome)   . Tachycardia     Medications:  Scheduled:  .  DULoxetine  30 mg Oral BID  . feeding supplement  1 Container Oral TID BM  . furosemide  20 mg Oral BID  . insulin aspart  0-20 Units Subcutaneous TID WC  . insulin aspart  0-5 Units Subcutaneous QHS  . insulin glargine  30 Units Subcutaneous BID  . levothyroxine  100 mcg Oral QAC breakfast  . metoprolol  100 mg Oral BID  . pantoprazole  40 mg Oral Daily  . pravastatin  40 mg Oral Daily  . sodium chloride flush  3 mL Intravenous Q12H  . sodium chloride flush  3 mL Intravenous Q12H  . vitamin C  1,000 mg Oral Daily  . vitamin E  400 Units Oral Daily   Infusions:  . sodium chloride 100 mL/hr at 06/29/15 1931  . heparin 1,650 Units/hr (06/29/15 0808)   PRN: sodium chloride, acetaminophen **OR** acetaminophen, albuterol, bisacodyl, diphenhydrAMINE, HYDROcodone-acetaminophen, ondansetron **OR** ondansetron (ZOFRAN) IV, polyethylene glycol, sodium chloride flush, traMADol Anti-infectives    None      Assessment: 65 yo female starting heparin for bilateral LE DVT. No bleeding, INR 1.01. Heparin level therapeutic at  0.72 this AM.   Goal of Therapy:  Heparin level 0.3-0.7 units/ml   Plan:  Decrease heparin infusion to 1550 units/hr Heparin level in 6 hours Check anti-Xa level daily while on heparin Continue to monitor H&H and platelets  Isac Sarna, BS Vena Austria, BCPS Clinical Pharmacist Pager [phone] 06/30/2015,7:34 AM

## 2015-06-30 NOTE — Progress Notes (Addendum)
PROGRESS NOTE  Stephanie [name] [id number] DOB: Sep 18, 1950 DOA: 06/27/2015 PCP: [name], MD  Brief Narrative: 89 yof with medical history of DM2, HTN, FM, DJD, and cataracts presented after bilateral LE DVT was found on LE doppler as outpatient. She has been referred for admission.   Assessment/Plan: 1. Bilateral LE DVT. Asymptomatic, no kidney biopsy planned for now, will change to apixaban. 2. Acute kidney injury, resolved. 3. Nephrotic syndrome, in setting of chronic skin rash; etiology unclear. Management per nephrology. 4. Subacute diarrhea, C. difficile screen equivocal. Had antibiotics for sinusitis in the last month and while taking them her diarrhea has substantially increased. Suspected acute C. difficile colitis. 5. Hypokalemia. Will replace. 6. Essential HTN, stable.  7. DM type 2. Hypoglycemia this morning. She reports substantial weight loss. Will substantially reduce her long-acting insulin and follow clinically. Change to sensitive sliding scale. 8. Tobacco use.  9. Obesity unspecified 10. Hypothyroidism. Continue levothyroxine.   Overall seems to be improving. Renal fxn improving. Nephrology plans 24-hour urine.   Treat empirically for C Diff.   Start on Eliquis.   Will coordinate outpatient skin biopsy with PCP a dermatologist. Also outpatient nephrology follow-up planned.  DVT prophylaxis: Lovenox Code Status: Full Family Communication: No family bedside.  Disposition Plan: Home  [name], MD  [facility] Direct contact:  --Via amion app OR  --[email or web address]; password [email or web address] and click  [id number] contact night coverage as above 06/30/2015, 2:28 PM  LOS: 3 days   Consultants:  Nephrology   Procedures:  None  Antimicrobials:  None  HPI/Subjective: Feels improved today. She has been eating breakfast. Still experiencing diarrhea. Reports increased swelling in her legs. Also reports some nausea.  Objective: Filed Vitals:   06/29/15 0604 06/29/15 1600 06/29/15 2235 06/30/15 0609  BP: 157/77 124/65 129/61 146/72  Pulse:   83 79  Temp: 97 F (36.1 C) 98 F (36.7 C) 97.8 F (36.6 C) 98 F (36.7 C)  TempSrc: Oral Oral Oral Oral  Resp:  20 20 20   Height:      Weight: 107.684 kg (237 lb 6.4 oz)   110.269 kg (243 lb 1.6 oz)  SpO2: 100% 100% 100% 100%    Intake/Output Summary (Last 24 hours) at 06/30/15 1428 Last data filed at 06/30/15 1300  Gross per 24 hour  Intake    480 ml  Output    850 ml  Net   -370 ml     Filed Weights   06/28/15 0646 06/29/15 0604 06/30/15 0609  Weight: 104.736 kg (230 lb 14.4 oz) 107.684 kg (237 lb 6.4 oz) 110.269 kg (243 lb 1.6 oz)    Exam:  Constitutional:  . Appears calm and comfortable Respiratory:  . CTA bilaterally, no w/r/r.  . Respiratory effort normal. No retractions or accessory muscle use Cardiovascular:  . RRR, no m/r/g . 2+ bliateral lower extremity edema up to knee Psychiatric:  . judgement and insight appear normal . Mental status o Mood, affect appropriate  I have personally reviewed following labs and imaging studies:  Sodium 134  Potassium 2.9  BUN and Cr wnl  Albumin 1.1  WBC 11.6  Hgb 11.5  Platelets 477  Scheduled Meds: . apixaban  10 mg Oral BID   Followed by  . [START ON 07/06/2015] apixaban  5 mg Oral BID  . DULoxetine  30 mg Oral BID  . feeding supplement  1 Container Oral TID BM  . furosemide  40 mg Intravenous BID  . insulin aspart  0-9 Units  Subcutaneous TID WC  . [START ON 07/01/2015] insulin glargine  10 Units Subcutaneous Daily  . levothyroxine  100 mcg Oral QAC breakfast  . metoprolol  100 mg Oral BID  . pantoprazole  40 mg Oral Daily  . potassium chloride  40 mEq Oral BID  . pravastatin  40 mg Oral Daily  . sodium chloride flush  3 mL Intravenous Q12H  . sodium chloride flush  3 mL Intravenous Q12H  . vancomycin  125 mg Oral QID  . vitamin C  1,000 mg Oral Daily  . vitamin E  400 Units Oral Daily    Continuous Infusions:    Principal Problem:   Nephrotic range proteinuria Active Problems:   Hypertension   Tobacco abuse   Diabetes ([place])   DVT (deep venous thrombosis), bilateral   Maculopapular rash, generalized   Alopecia   AKI (acute kidney injury) ([place])   LOS: 3 days   Time spent 15 minutes  By signing my name below, I, [name], attest that this documentation has been prepared under the direction and in the presence of Seward Coran P. Sarajane Jews, MD. Electronically Signed: [name], Scribe.  05/14/201712:20pm   I personally performed the services described in this documentation. All medical record entries made by the scribe were at my direction. I have reviewed the chart and agree that the record reflects my personal performance and is accurate and complete. [name], MD

## 2015-07-01 DIAGNOSIS — E162 Hypoglycemia, unspecified: Secondary | ICD-10-CM

## 2015-07-01 LAB — GLUCOSE, CAPILLARY
GLUCOSE-CAPILLARY: 117 mg/dL — AB (ref 65–99)
GLUCOSE-CAPILLARY: 84 mg/dL (ref 65–99)
GLUCOSE-CAPILLARY: 126 mg/dL — AB (ref 65–99)
GLUCOSE-CAPILLARY: 108 mg/dL — AB (ref 65–99)
GLUCOSE-CAPILLARY: 69 mg/dL (ref 65–99)
GLUCOSE-CAPILLARY: 140 mg/dL — AB (ref 65–99)
GLUCOSE-CAPILLARY: 190 mg/dL — AB (ref 65–99)
Glucose-Capillary: 90 mg/dL (ref 65–99)
Glucose-Capillary: 188 mg/dL — ABNORMAL HIGH (ref 65–99)
Glucose-Capillary: 58 mg/dL — ABNORMAL LOW (ref 65–99)
Glucose-Capillary: 95 mg/dL (ref 65–99)
Glucose-Capillary: 67 mg/dL (ref 65–99)
Glucose-Capillary: 116 mg/dL — ABNORMAL HIGH (ref 65–99)
Glucose-Capillary: 63 mg/dL — ABNORMAL LOW (ref 65–99)
Glucose-Capillary: 58 mg/dL — ABNORMAL LOW (ref 65–99)
Glucose-Capillary: 68 mg/dL (ref 65–99)
Glucose-Capillary: 85 mg/dL (ref 65–99)
Glucose-Capillary: 53 mg/dL — ABNORMAL LOW (ref 65–99)
Glucose-Capillary: 138 mg/dL — ABNORMAL HIGH (ref 65–99)
Glucose-Capillary: 220 mg/dL — ABNORMAL HIGH (ref 65–99)

## 2015-07-01 LAB — MPO/PR-3 (ANCA) ANTIBODIES: ANCA Proteinase 3: <3.5 U/mL (ref 0.0–3.5)

## 2015-07-01 LAB — CBC
HEMATOCRIT: 29.5 % — AB (ref 36.0–46.0)
HEMOGLOBIN: 9.6 g/dL — AB (ref 12.0–15.0)
MCH: 26.4 pg (ref 26.0–34.0)
MCHC: 32.5 g/dL (ref 30.0–36.0)
MCV: 81.3 fL (ref 78.0–100.0)
Platelets: 357 10*3/uL (ref 150–400)
RBC: 3.63 MIL/uL — ABNORMAL LOW (ref 3.87–5.11)
RDW: 13.8 % (ref 11.5–15.5)
WBC: 10 10*3/uL (ref 4.0–10.5)

## 2015-07-01 LAB — CLOSTRIDIUM DIFFICILE BY PCR: Toxigenic C. Difficile by PCR: NEGATIVE

## 2015-07-01 LAB — ANTINUCLEAR ANTIBODIES, IFA: ANA Ab, IFA: NEGATIVE

## 2015-07-01 LAB — RENAL FUNCTION PANEL
ANION GAP: 3 — AB (ref 5–15)
Albumin: 1 g/dL — ABNORMAL LOW (ref 3.5–5.0)
BUN: 14 mg/dL (ref 6–20)
CHLORIDE: 109 mmol/L (ref 101–111)
CO2: 23 mmol/L (ref 22–32)
Calcium: 7.5 mg/dL — ABNORMAL LOW (ref 8.9–10.3)
Creatinine, Ser: 0.87 mg/dL (ref 0.44–1.00)
GFR calc Af Amer: >60 mL/min (ref >60)
Glucose, Bld: 87 mg/dL (ref 65–99)
PHOSPHORUS: 3.3 mg/dL (ref 2.5–4.6)
POTASSIUM: 3.5 mmol/L (ref 3.5–5.1)
Sodium: 135 mmol/L (ref 135–145)

## 2015-07-01 LAB — PROTEIN, URINE, 24 HOUR
COLLECTION INTERVAL-UPROT: 24 h
Protein, 24H Urine: 9084 mg/d — ABNORMAL HIGH (ref 50–100)
Protein, Urine: 757 mg/dL
URINE TOTAL VOLUME-UPROT: 1200 mL

## 2015-07-01 LAB — GLOMERULAR BASEMENT MEMBRANE ANTIBODIES: GBM AB: 4 U (ref 0–20)

## 2015-07-01 MED ORDER — TORSEMIDE 20 MG PO TABS
40.0000 mg | ORAL_TABLET | Freq: Every day | ORAL | Status: DC
  Administered 2015-07-01 – 2015-07-02 (×2): 40 mg via ORAL
  Filled 2015-07-01 (×2): qty 2

## 2015-07-01 MED ORDER — LOSARTAN POTASSIUM 50 MG PO TABS
100.0000 mg | ORAL_TABLET | Freq: Every day | ORAL | Status: DC
  Administered 2015-07-01 – 2015-07-02 (×2): 100 mg via ORAL
  Filled 2015-07-01 (×2): qty 2

## 2015-07-01 MED ORDER — POTASSIUM CHLORIDE CRYS ER 20 MEQ PO TBCR
20.0000 meq | EXTENDED_RELEASE_TABLET | Freq: Every day | ORAL | Status: DC
  Administered 2015-07-01 – 2015-07-02 (×2): 20 meq via ORAL
  Filled 2015-07-01: qty 1

## 2015-07-01 NOTE — Progress Notes (Signed)
Subjective: Interval History: Patient presently denies any nausea or vomiting. She denies also any difficulty breathing overall she's feeling better.  Objective: Vital signs in last 24 hours: Temp:  [97.6 F (36.4 C)-98.5 F (36.9 C)] 97.9 F (36.6 C) (05/15 0451) Pulse Rate:  [74-86] 76 (05/15 0451) Resp:  [18-20] 20 (05/15 0451) BP: (119-146)/(61-78) 146/78 mmHg (05/15 0451) SpO2:  [99 %-100 %] 100 % (05/15 0451) Weight:  [110.904 kg (244 lb 8 oz)] 110.904 kg (244 lb 8 oz) (05/15 0451) Weight change: 0.635 kg (1 lb 6.4 oz)  Intake/Output from previous day: 05/14 0701 - 05/15 0700 In: 811 [P.O.:720; I.V.:91] Out: 1300 [Urine:1300] Intake/Output this shift:    Generally patient is alert and in no apparent distress Chest is clear to auscultation Heart exam regular rate and rhythm Extremities she has 2+ edema  Lab Results:  Recent Labs  06/30/15 0619 07/01/15 0459  WBC 11.6* 10.0  HGB 11.5* 9.6*  HCT 36.0 29.5*  PLT 477* 357   BMET:   Recent Labs  06/30/15 0619 07/01/15 0449  NA 134* 135  K 2.9* 3.5  CL 108 109  CO2 23 23  GLUCOSE 73 87  BUN 13 14  CREATININE 0.85 0.87  CALCIUM 7.6* 7.5*   No results for input(s): PTH in the last 72 hours. Iron Studies: No results for input(s): IRON, TIBC, TRANSFERRIN, FERRITIN in the last 72 hours.  Studies/Results: No results found.  I have reviewed the patient's current medications.  Assessment/Plan: Problem #1 acute kidney injury: Renal function has recovered. Problem #2 hypokalemia: Patient is on potassium supplement. Her potassium has normalized. Problem #3 history of diarrhea, nausea vomiting. Patient presently is positive for C. difficile colonization but no sign of infection. Etiology for her diarrhea is not clear. Patient is improving. Problem #4 hypertension: Her blood pressure is reasonably controlled Problem #5 nephrotic range proteinuria: Hepatitis C antibodies negative, HIV, has a DNA is also negative.  Other workup presently spending. Problem #6 possible vasculitis. Workup is pending. Problem #7 history of DVT she is on heparin Problem #8 history of diabetes: Her blood sugar is reasonably controlled Plan: 1]We'll DC IV Lasix. 2] we'll start her on Demadex 40 mg by mouth daily 3] we will decrease potassium to 20 mEq once a day 4] we'll check renal panel in the morning. 5] we'll see patient history 4 weeks if she is going to be discharged. 6] start patient on Cozaar 100 mg by mouth daily   LOS: 4 days   Falisa Lamora S 07/01/2015,8:28 AM

## 2015-07-01 NOTE — Progress Notes (Signed)
Inpatient Diabetes Program Recommendations  AACE/ADA: New Consensus Statement on Inpatient Glycemic Control (2015)  Target Ranges:  Prepandial:   less than 140 mg/dL      Peak postprandial:   less than 180 mg/dL (1-2 hours)      Critically ill patients:  140 - 180 mg/dL   Review of Glycemic Control:  Results for MABRIE, SERRES (MRN [id number]) as of 07/01/2015 10:22  Ref. Range 06/30/2015 10:44 06/30/2015 11:09 06/30/2015 16:15 06/30/2015 21:23 07/01/2015 08:03 07/01/2015 08:32 07/01/2015 09:50  Glucose-Capillary Latest Ref Range: 65-99 mg/dL 58 (L) 68 85 108 (H) 53 (L) 69 138 (H)    Diabetes history: Type 2 diabetes Outpatient Diabetes medications: Lantus 60 units bid, Humalog 9-18 units tid with meals, Jantadueto 2.5/500 mg bid Current orders for Inpatient glycemic control:  Novolog sensitive tid with meals  Inpatient Diabetes Program Recommendations:    Note low blood sugars with home dose of Lantus.  Lantus reduced to 30 units daily on 5/14 and patient was still hypoglycemic this AM.  Likely will need decreased doses of Lantus at discharge?  Sent text page to Dr. Sarajane Jews.  Thanks Adah Perl, RN, BC-ADM Inpatient Diabetes Coordinator Pager [phone] (8a-5p)

## 2015-07-01 NOTE — Progress Notes (Signed)
PROGRESS NOTE  Stephanie [name] [id number] DOB: May 22, 1950 DOA: 06/27/2015 PCP: [name], MD  Brief Narrative: 30 yof with medical history of DM2, HTN, FM, DJD, and cataracts presented after bilateral LE DVT was found on LE doppler as outpatient. She has been referred for admission.   Assessment/Plan: 1. Bilateral LE DVT. Remains asymptomatic. Continue Elocon as. 2. Acute kidney injury, resolved. 3. Nephrotic syndrome. Chronic rash. Further management per nephrology. Outpatient follow-up for skin biopsy recommended. 4. C. difficile colitis, given clinical history. Had antibiotics for sinusitis in the last month and while taking them her diarrhea has substantially increased. Suspected acute C. difficile colitis. Started on vancomycin.  5. Hypokalemia. Resolved.  6. Essential HTN, stable.  7. DM type 2. Recurrent hypoglycemia. Reports significant weight loss. Lantus decreased significantly but still hypoglycemic. 8. Tobacco use disorder 9. Obesity unspecified 10. Hypothyroidism. Continue levothyroxine.   Overall appears to be stabilizing. We'll continue oral anticoagulation.  Management of nephrotic syndrome and workup per nephrology. She has been changed to oral diuretics.  Continue oral vancomycin, diarrhea is lying down.  Discontinue long-acting insulin and follow.  If glucose stabilizes and diarrhea continues to improve would anticipate discharge 5/16.  DVT prophylaxis: Eliquis  Code Status: Full Family Communication: No family bedside.  Disposition Plan: Anticipate discharge in 24 hours.   [name], MD  [facility] Direct contact:  --Via amion app OR  --[email or web address]; password [email or web address] and click  [id number] contact night coverage as above 07/01/2015, 5:11 PM  LOS: 4 days   Consultants:  Nephrology   Procedures:  None  Antimicrobials:  Oral Vancomycin 5/14>>  HPI/Subjective: Legs are a little more swollen and painful. Diarrhea is slowing down.  Overall she seems to be doing all right. Hypoglycemic again.  Objective: Filed Vitals:   06/30/15 1450 06/30/15 2126 07/01/15 0451 07/01/15 1534  BP: 119/61 128/78 146/78 132/62  Pulse: 74 86 76 84  Temp: 98.5 F (36.9 C) 97.6 F (36.4 C) 97.9 F (36.6 C) 98.1 F (36.7 C)  TempSrc: Oral Oral Oral Oral  Resp: 18 20 20 20   Height:      Weight:   110.904 kg (244 lb 8 oz)   SpO2: 99% 100% 100% 100%    Intake/Output Summary (Last 24 hours) at 07/01/15 1711 Last data filed at 07/01/15 1538  Gross per 24 hour  Intake    240 ml  Output   2250 ml  Net  -2010 ml     Filed Weights   06/29/15 0604 06/30/15 0609 07/01/15 0451  Weight: 107.684 kg (237 lb 6.4 oz) 110.269 kg (243 lb 1.6 oz) 110.904 kg (244 lb 8 oz)    Exam:  Constitutional:  . Appears calm and comfortable Respiratory:  . CTA bilaterally, no w/r/r.  . Respiratory effort normal. No retractions or accessory muscle use Cardiovascular:  . RRR, no m/r/g . 3+ bliateral lower extremity edema up to thighs today. Psychiatric:  . judgement and insight appear normal . Mental status o Mood, affect appropriate  I have personally reviewed following labs and imaging studies:  Potassium 3.5, remainder BMP unremarkable.   UOP 1300  CBG low 53 this am.   Hgb 9.6, slightly lower.   Scheduled Meds: . apixaban  10 mg Oral BID   Followed by  . [START ON 07/06/2015] apixaban  5 mg Oral BID  . DULoxetine  30 mg Oral BID  . feeding supplement  1 Container Oral TID BM  . insulin aspart  0-9 Units Subcutaneous TID WC  .  levothyroxine  100 mcg Oral QAC breakfast  . losartan  100 mg Oral Daily  . metoprolol  100 mg Oral BID  . pantoprazole  40 mg Oral Daily  . potassium chloride  20 mEq Oral Daily  . pravastatin  40 mg Oral Daily  . sodium chloride flush  3 mL Intravenous Q12H  . sodium chloride flush  3 mL Intravenous Q12H  . torsemide  40 mg Oral Daily  . vancomycin  125 mg Oral QID  . vitamin C  1,000 mg Oral Daily  .  vitamin E  400 Units Oral Daily   Continuous Infusions:    Principal Problem:   DVT (deep venous thrombosis), bilateral Active Problems:   Hypertension   Tobacco abuse   Diabetes ([facility])   Nephrotic range proteinuria   Maculopapular rash, generalized   Alopecia   Hypoglycemia   LOS: 4 days   Time spent 15 minutes   By signing my name below, I, Rennis Harding attest that this documentation has been prepared under the direction and in the presence of [name], MD Electronically signed: Rennis Harding 07/01/2015 10:18am   I personally performed the services described in this documentation. All medical record entries made by the scribe were at my direction. I have reviewed the chart and agree that the record reflects my personal performance and is accurate and complete. [name], MD

## 2015-07-01 NOTE — Care Management Note (Signed)
Case Management Note  Patient Details  Name: TYMISHA TOMASO MRN: [id number] Date of Birth: 03/16/1950  Subjective/Objective: Spoke with the patient who is from home with her daughter, Drives self unless her legs are swelling. Uses a walker or cane at home. Stated that she uses cane most of the time.  Insured with medicare and medicaid. No difficulty with filling prescription medications. Has home Nebulizer machine.  Has used [facility] in the past and would like to continue with them. More to follow.                    Action/Plan: Home wit self care.   Expected Discharge Date:                  Expected Discharge Plan:  Home/Self Care  In-House Referral:     Discharge planning Services  CM Consult  Post Acute Care Choice:    Choice offered to:     DME Arranged:    DME Agency:     HH Arranged:    HH Agency:     Status of Service:  In process, will continue to follow  Medicare Important Message Given:  Yes Date Medicare IM Given:    Medicare IM give by:    Date Additional Medicare IM Given:    Additional Medicare Important Message give by:     If discussed at [place] of Stay Meetings, dates discussed:    Additional Comments:  Alvie Heidelberg, RN 07/01/2015, 2:45 PM

## 2015-07-01 NOTE — Care Management Important Message (Signed)
Important Message  Patient Details  Name: Stephanie [name] MRN: [id number] Date of Birth: 06/01/1950   Medicare Important Message Given:  Yes    Alvie Heidelberg, RN 07/01/2015, 9:14 AM

## 2015-07-02 ENCOUNTER — Ambulatory Visit: Payer: Medicare Other | Admitting: Gastroenterology

## 2015-07-02 LAB — CBC
HCT: 28.7 % — ABNORMAL LOW (ref 36.0–46.0)
Hemoglobin: 9.3 g/dL — ABNORMAL LOW (ref 12.0–15.0)
MCH: 26.5 pg (ref 26.0–34.0)
MCHC: 32.4 g/dL (ref 30.0–36.0)
MCV: 81.8 fL (ref 78.0–100.0)
PLATELETS: 268 10*3/uL (ref 150–400)
RBC: 3.51 MIL/uL — AB (ref 3.87–5.11)
RDW: 14 % (ref 11.5–15.5)
WBC: 9.9 10*3/uL (ref 4.0–10.5)

## 2015-07-02 LAB — PROTEIN ELECTROPHORESIS, SERUM
A/G Ratio: 0.2 — ABNORMAL LOW (ref 0.7–1.7)
ALBUMIN ELP: 1.2 g/dL — AB (ref 2.9–4.4)
ALPHA-1-GLOBULIN: 0.2 g/dL (ref 0.0–0.4)
ALPHA-2-GLOBULIN: 1.7 g/dL — AB (ref 0.4–1.0)
Beta Globulin: 2.5 g/dL — ABNORMAL HIGH (ref 0.7–1.3)
GLOBULIN, TOTAL: 4.9 g/dL — AB (ref 2.2–3.9)
Gamma Globulin: 0.5 g/dL (ref 0.4–1.8)
M-Spike, %: 0.4 g/dL — ABNORMAL HIGH
TOTAL PROTEIN ELP: 6.1 g/dL (ref 6.0–8.5)

## 2015-07-02 LAB — RENAL FUNCTION PANEL
Albumin: 1 g/dL — ABNORMAL LOW (ref 3.5–5.0)
Anion gap: 3 — ABNORMAL LOW (ref 5–15)
BUN: 14 mg/dL (ref 6–20)
CALCIUM: 7.6 mg/dL — AB (ref 8.9–10.3)
CO2: 23 mmol/L (ref 22–32)
CREATININE: 0.91 mg/dL (ref 0.44–1.00)
Chloride: 109 mmol/L (ref 101–111)
GFR calc Af Amer: >60 mL/min (ref >60)
GFR calc non Af Amer: >60 mL/min (ref >60)
GLUCOSE: 205 mg/dL — AB (ref 65–99)
Phosphorus: 3.2 mg/dL (ref 2.5–4.6)
Potassium: 3.8 mmol/L (ref 3.5–5.1)
SODIUM: 135 mmol/L (ref 135–145)

## 2015-07-02 LAB — GLUCOSE, CAPILLARY
GLUCOSE-CAPILLARY: 178 mg/dL — AB (ref 65–99)
Glucose-Capillary: 169 mg/dL — ABNORMAL HIGH (ref 65–99)

## 2015-07-02 LAB — IGA: IgA: 743 mg/dL — ABNORMAL HIGH (ref 87–352)

## 2015-07-02 MED ORDER — METOLAZONE 5 MG PO TABS: 5.0000 mg | ORAL_TABLET | Freq: Two times a day (BID) | ORAL | Status: DC

## 2015-07-02 MED ORDER — VANCOMYCIN 50 MG/ML ORAL SOLUTION: 125.0000 mg | Freq: Four times a day (QID) | ORAL | Status: DC

## 2015-07-02 MED ORDER — APIXABAN 5 MG PO TABS: ORAL_TABLET | ORAL | Status: DC

## 2015-07-02 MED ORDER — METOLAZONE 5 MG PO TABS
5.0000 mg | ORAL_TABLET | Freq: Two times a day (BID) | ORAL | Status: DC
  Administered 2015-07-02: 5 mg via ORAL
  Filled 2015-07-02: qty 1

## 2015-07-02 MED ORDER — POTASSIUM CHLORIDE CRYS ER 20 MEQ PO TBCR: 20.0000 meq | EXTENDED_RELEASE_TABLET | Freq: Every day | ORAL | Status: DC

## 2015-07-02 MED ORDER — TORSEMIDE 20 MG PO TABS: 40.0000 mg | ORAL_TABLET | Freq: Every day | ORAL | Status: DC

## 2015-07-02 NOTE — Progress Notes (Signed)
Discharged PT per MD order and protocol. Discharge handouts reviewed/explained. Education completed.  Pt verbalized understanding and left with all belongings. VSS. IV catheter D/C.  Patient wheeled down by staff member.  

## 2015-07-02 NOTE — Progress Notes (Signed)
07/02/15 Sent text page to MD to discuss discharge DM medications.  Pt has experience some lows in the hospital while on Lantus.  Currently not on Lantus in the hospital. Blood sugars 169-220.  Pt may need a reduced amount of Lantus for home.  Spoke with nurse regarding DM medications at discharge.  Doyle, CDE. M.Ed. Pager [phone] Inpatient Diabetes Coordinator

## 2015-07-02 NOTE — Discharge Summary (Signed)
Physician Discharge Summary  Stephanie [name] XNA:[id number] DOB: Aug 24, 1950 DOA: 06/27/2015  PCP: [name], MD  Admit date: 06/27/2015 Discharge date: 07/02/2015  Recommendations for Outpatient Follow-up:  1. Will follow up with nephrology as an outpatient for nephrotic syndrome  2. Suggested outpatient skin biopsy, hopefully this can provide a diagnosis and kidney biopsy can be avoided  3. Resolution of C. difficile colitis  4. Monitoring of diabetes. Significant hypoglycemia even on low-dose Lantus. Patient reported significant weight loss, currently on oral therapy and will follow blood sugars closely. May need to reinstitute insulin if sugars increase.  5. New start anticoagulation for bilateral large array DVT 6. Recommend bilateral lower extremity compression hose   Follow-up Information    Follow up with [facility] S, MD In 4 weeks.   Specialty:  Nephrology   Contact information:   [address] [address] [phone]       Follow up with [name], MD In 1 week.   Specialty:  Nurse Practitioner   Contact information:   [place] [id number] [phone]      Discharge Diagnoses:  1. Bilateral LE DVT. 2. Acute kidney injury 3. Nephrotic syndrome 4. C difficile colitis. 5. Essential HTN 6. DM type 2 7. Tobacco use disorder 8. Obesity 9. Hypothyroidism.   Discharge Condition: Improved Disposition: Discharge home  Diet recommendation: Carb modified  Filed Weights   06/30/15 0609 07/01/15 0451 07/02/15 0634  Weight: 110.269 kg (243 lb 1.6 oz) 110.904 kg (244 lb 8 oz) 110.723 kg (244 lb 1.6 oz)    History of present illness:  15 yof with medical history of DM2, HTN, FM, DJD, and cataracts presented after bilateral LE DVT was found on LE doppler as outpatient. She has been referred for admission.   Hospital Course:  Patient was seen in consultation with nephrology, renal function recovered in  initial workup was relatively unrevealing. Nephrology recommends pursuing outpatient skin biopsy and if that is unrevealing think kidney biopsy could be considered. Diuretics have been adjusted as below. Bilateral DVT of the lower extremities was found, discussed options including warfarin and new or infusions. The patient elected Eliquis in the risk/benefit of these drugs was discussed. Patient reported diarrhea that began significantly in the context of antibiotics and was persistent. C. difficile screen was equivocal but given her symptomatology will treat for C. difficile. Reported significant weight loss with hypoglycemia at home, hypoglycemia was demonstrated in the hospital several times despite low-dose insulin. Therefore this point she's been educated to do her oral therapy and follow blood sugars closely.    1. Bilateral LE DVT. Doing well, asymptomatic. Continue Eliquis. 2. Acute kidney injury, resolved. Will follow up with neurology as an outpatient. 3. Nephrotic syndrome. Chronic rash. Further management per nephrology. Outpatient follow-up for skin biopsy recommended. 4. C. difficile colitis, given clinical history. Improving clinically. Had antibiotics for sinusitis in the last month and while taking them her diarrhea has substantially increased.  5. Hypokalemia. Resolved.  6. Essential HTN, remained stable. 7. DM type 2. Hypoglycemia resolved. Continue oral agent. Hold insulin on discharge. Tobacco use disorder 8. Obesity unspecified 9. Hypothyroidism. Continue levothyroxine.  Consultants:  Nephrology  Procedures:  None  Antimicrobials:  Oral Vancomycin 5/14>> 5/27  Discharge Instructions  Discharge Instructions    Activity as tolerated - No restrictions    Complete by:  As directed      Diet - low sodium heart healthy    Complete by:  As directed  Diet Carb Modified    Complete by:  As directed      Discharge instructions    Complete by:  As directed   Call  physician or seek immediate medical attention for pain, increased diarrhea, shortness of breath, difficulty urinating or worsening of condition. Check your blood sugars several times per day and keep a log. Call your physician for blood sugars greater than 400 or less than 70.          Current Discharge Medication List    START taking these medications   Details  apixaban (ELIQUIS) 5 MG TABS tablet Take 10 mg by mouth BID through Friday 5/19 evening. Take 61m by mouth BID starting 5/20 AM. Qty: 65 tablet, Refills: 0    metolazone (ZAROXOLYN) 5 MG tablet Take 1 tablet (5 mg total) by mouth 2 (two) times daily. Qty: 60 tablet, Refills: 0    potassium chloride SA (K-DUR,KLOR-CON) 20 MEQ tablet Take 1 tablet (20 mEq total) by mouth daily. Qty: 30 tablet, Refills: 0    torsemide (DEMADEX) 20 MG tablet Take 2 tablets (40 mg total) by mouth daily. Qty: 60 tablet, Refills: 0    vancomycin (VANCOCIN) 50 mg/mL oral solution Take 2.5 mLs (125 mg total) by mouth 4 (four) times daily. Qty: 120 mL, Refills: 0      CONTINUE these medications which have NOT CHANGED   Details  albuterol (PROVENTIL HFA;VENTOLIN HFA) 108 (90 BASE) MCG/ACT inhaler Inhale 2 puffs into the lungs every 6 (six) hours as needed for wheezing or shortness of breath.    albuterol (PROVENTIL) (2.5 MG/3ML) 0.083% nebulizer solution Take 2.5 mg by nebulization every 6 (six) hours as needed for wheezing or shortness of breath.    Bilberry, Vaccinium myrtillus, 100 MG CAPS Take 1 capsule by mouth 2 (two) times daily.    cetirizine (ZYRTEC) 10 MG tablet Take 10 mg by mouth daily.    Cholecalciferol (VITAMIN D) 1000 UNITS capsule Take 1,000 Units by mouth 2 (two) times daily.     clobetasol ointment (TEMOVATE) 04.17% Apply 1 application topically 2 (two) times daily.     Cyanocobalamin (VITAMIN B-12 PO) Take 1 tablet by mouth daily.      DULoxetine (CYMBALTA) 30 MG capsule Take 30 mg by mouth 2 (two) times daily.       HYDROcodone-acetaminophen (NORCO/VICODIN) 5-325 MG tablet Take 1 tablet by mouth every 4 (four) hours as needed. Qty: 15 tablet, Refills: 0    hydrOXYzine (ATARAX/VISTARIL) 25 MG tablet Take 25 mg by mouth every 8 (eight) hours as needed.     JENTADUETO 2.5-500 MG TABS Take 1 tablet by mouth 2 (two) times daily.     levothyroxine (SYNTHROID, LEVOTHROID) 100 MCG tablet Take 100 mcg by mouth daily before breakfast.  Refills: 0    lisinopril (PRINIVIL,ZESTRIL) 10 MG tablet Take 10 mg by mouth daily.    Melatonin 5 MG CAPS Take 2 capsules by mouth at bedtime.    metoprolol (LOPRESSOR) 100 MG tablet TAKE 1 TABLET BY MOUTH TWICE DAILY **TAKE WITH 25MG TABLET FOR TOTAL DOSAGE OF 125MG** Qty: 180 tablet, Refills: 1    mupirocin ointment (BACTROBAN) 2 % Apply 1 application topically 2 (two) times daily.     pantoprazole (PROTONIX) 40 MG tablet Take 40 mg by mouth daily.     pravastatin (PRAVACHOL) 40 MG tablet Take 40 mg by mouth daily.     PULMICORT FLEXHALER 180 MCG/ACT inhaler Inhale 2 puffs into the lungs daily as needed (for  shortness of breath-wheezing).     traMADol (ULTRAM) 50 MG tablet Take 1 tablet (50 mg total) by mouth every 6 (six) hours as needed. Qty: 16 tablet, Refills: 0    vitamin C (ASCORBIC ACID) 500 MG tablet Take 1,000 mg by mouth daily.    vitamin E 400 UNIT capsule Take 400 Units by mouth daily.      STOP taking these medications     aspirin EC 81 MG tablet      diclofenac (VOLTAREN) 50 MG EC tablet      furosemide (LASIX) 40 MG tablet      Insulin Glargine (LANTUS SOLOSTAR) 100 UNIT/ML Solostar Pen      insulin lispro (HUMALOG KWIKPEN) 100 UNIT/ML KiwkPen      sitaGLIPtin-metformin (JANUMET) 50-1000 MG tablet      clindamycin (CLEOCIN) 300 MG capsule        Allergies  Allergen Reactions  . Tetracyclines & Related Anaphylaxis  . Banana Hives and Nausea And Vomiting  . Penicillins Rash    Has patient had a PCN reaction causing immediate rash,  facial/tongue/throat swelling, SOB or lightheadedness with hypotension: No Has patient had a PCN reaction causing severe rash involving mucus membranes or skin necrosis: No Has patient had a PCN reaction that required hospitalization No Has patient had a PCN reaction occurring within the last 10 years: No If all of the above answers are "NO", then may proceed with Cephalosporin use.     The results of significant diagnostics from this hospitalization (including imaging, microbiology, ancillary and laboratory) are listed below for reference.    Significant Diagnostic Studies: US Venous Img Lower Bilateral  06/27/2015  CLINICAL DATA:  Bilateral leg swelling, initial encounter EXAM: BILATERAL LOWER EXTREMITY VENOUS DOPPLER ULTRASOUND TECHNIQUE: Gray-scale sonography with graded compression, as well as color Doppler and duplex ultrasound were performed to evaluate the lower extremity deep venous systems from the level of the common femoral vein and including the common femoral, femoral, profunda femoral, popliteal and calf veins including the posterior tibial, peroneal and gastrocnemius veins when visible. The superficial great saphenous vein was also interrogated. Spectral Doppler was utilized to evaluate flow at rest and with distal augmentation maneuvers in the common femoral, femoral and popliteal veins. COMPARISON:  None. FINDINGS: RIGHT LOWER EXTREMITY Common Femoral Vein: No evidence of thrombus. Normal compressibility, respiratory phasicity and response to augmentation. Saphenofemoral Junction: No evidence of thrombus. Normal compressibility and flow on color Doppler imaging. Profunda Femoral Vein: No evidence of thrombus. Normal compressibility and flow on color Doppler imaging. Femoral Vein: No evidence of thrombus. Normal compressibility, respiratory phasicity and response to augmentation. Popliteal Vein: No evidence of thrombus. Normal compressibility, respiratory phasicity and response to  augmentation. Calf Veins: Increased echogenicity is noted within the posterior tibial vein with decreased compressibility consistent with thrombus. Superficial Great Saphenous Vein: No evidence of thrombus. Normal compressibility and flow on color Doppler imaging. Venous Reflux:  None. Other Findings: Generalized subcutaneous edema is noted within the calf. LEFT LOWER EXTREMITY Common Femoral Vein: No evidence of thrombus. Normal compressibility, respiratory phasicity and response to augmentation. Saphenofemoral Junction: No evidence of thrombus. Normal compressibility and flow on color Doppler imaging. Profunda Femoral Vein: No evidence of thrombus. Normal compressibility and flow on color Doppler imaging. Femoral Vein: No evidence of thrombus. Normal compressibility, respiratory phasicity and response to augmentation. Popliteal Vein: No evidence of thrombus. Normal compressibility, respiratory phasicity and response to augmentation. Calf Veins: Increased echogenicity is noted within the posterior tibial vein with decreased compressibility  consistent with thrombus. Superficial Great Saphenous Vein: No evidence of thrombus. Normal compressibility and flow on color Doppler imaging. Venous Reflux:  None. Other Findings: Generalized subcutaneous edema is noted within the calf. Multiple prominent lymph nodes are noted in the left groin consistent with reactive nodes. IMPRESSION: Bilateral posterior tibial vein thrombus. Electronically Signed   By: Inez Catalina M.D.   On: 06/27/2015 15:15    Microbiology: Recent Results (from the past 240 hour(s))  MRSA PCR Screening     Status: None   Collection Time: 06/28/15  1:20 AM  Result Value Ref Range Status   MRSA by PCR NEGATIVE NEGATIVE Final    Comment:        The GeneXpert MRSA Assay (FDA approved for NASAL specimens only), is one component of a comprehensive MRSA colonization surveillance program. It is not intended to diagnose MRSA infection nor to guide  or monitor treatment for MRSA infections.   C difficile quick scan w PCR reflex     Status: Abnormal   Collection Time: 06/28/15 12:20 PM  Result Value Ref Range Status   C Diff antigen POSITIVE (A) NEGATIVE Final   C Diff toxin NEGATIVE NEGATIVE Final   C Diff interpretation   Final    C. difficile present, but toxin not detected. This indicates colonization. In most cases, this does not require treatment. If patient has signs and symptoms consistent with colitis, consider treatment. Requires ENTERIC precautions.  Clostridium Difficile by PCR     Status: None   Collection Time: 06/28/15 12:20 PM  Result Value Ref Range Status   Toxigenic C Difficile by pcr NEGATIVE NEGATIVE Final    Comment: Performed at [facility]: Basic Metabolic Panel:  Recent Labs Lab 06/27/15 2020 06/28/15 0615 06/30/15 0619 07/01/15 0449 07/02/15 0440  NA 136 135 134* 135 135  K 3.4* 3.0* 2.9* 3.5 3.8  CL 105 106 108 109 109  CO2 25 25 23 23 23   GLUCOSE 179* 114* 73 87 205*  BUN 21* 21* 13 14 14   CREATININE 1.44* 1.24* 0.85 0.87 0.91  CALCIUM 8.0* 7.5* 7.6* 7.5* 7.6*  PHOS  --   --  3.5 3.3 3.2   Liver Function Tests:  Recent Labs Lab 06/27/15 2020 06/30/15 0619 07/01/15 0449 07/02/15 0440  AST 21  --   --   --   ALT 20  --   --   --   ALKPHOS 356*  --   --   --   BILITOT 0.4  --   --   --   PROT 6.6  --   --   --   ALBUMIN 1.1* 1.1* 1.0* 1.0*   CBC:  Recent Labs Lab 06/27/15 2020 06/28/15 0615 06/29/15 0632 06/30/15 0619 07/01/15 0459 07/02/15 0440  WBC 9.1 10.8* 8.7 11.6* 10.0 9.9  NEUTROABS 3.5  --   --   --   --   --   HGB 11.5* 10.6* 10.6* 11.5* 9.6* 9.3*  HCT 35.6* 32.8* 33.9* 36.0 29.5* 28.7*  MCV 80.0 80.2 81.1 80.4 81.3 81.8  PLT 439* 407* 458* 477* 357 268    CBG:  Recent Labs Lab 07/01/15 1106 07/01/15 1707 07/01/15 2124 07/02/15 0727 07/02/15 1131  GLUCAP 140* 220* 190* 169* 178*    Principal Problem:   DVT (deep venous  thrombosis), bilateral Active Problems:   Hypertension   Tobacco abuse   Diabetes (HCC)   Nephrotic range proteinuria   Maculopapular rash, generalized  Alopecia   Hypoglycemia   Time coordinating discharge: 35 minutes  Signed:  [name], MD [facility] 07/02/2015, 3:11 PM  By signing my name below, I, [name], attest that this documentation has been prepared under the direction and in the presence of Daniel P. Sarajane Jews, MD. Electronically Signed: [name], Scribe.  07/02/2015 10:55am  I personally performed the services described in this documentation. All medical record entries made by the scribe were at my direction. I have reviewed the chart and agree that the record reflects my personal performance and is accurate and complete. [name], MD

## 2015-07-02 NOTE — Discharge Instructions (Signed)
Information on my medicine - ELIQUIS (apixaban)  This medication education was reviewed with me or my healthcare representative as part of my discharge preparation.  The pharmacist that spoke with me during my hospital stay was:  Pricilla Larsson, [facility]  Why was Eliquis prescribed for you? Eliquis was prescribed to treat blood clots that may have been found in the veins of your legs (deep vein thrombosis) or in your lungs (pulmonary embolism) and to reduce the risk of them occurring again.  What do You need to know about Eliquis ? The starting dose is 10 mg (two 5 mg tablets) taken TWICE daily for the FIRST SEVEN (7) DAYS, then on  5/20  the dose is reduced to ONE 5 mg tablet taken TWICE daily.  Eliquis may be taken with or without food.   Try to take the dose about the same time in the morning and in the evening. If you have difficulty swallowing the tablet whole please discuss with your pharmacist how to take the medication safely.  Take Eliquis exactly as prescribed and DO NOT stop taking Eliquis without talking to the doctor who prescribed the medication.  Stopping may increase your risk of developing a new blood clot.  Refill your prescription before you run out.  After discharge, you should have regular check-up appointments with your healthcare provider that is prescribing your Eliquis.    What do you do if you miss a dose? If a dose of ELIQUIS is not taken at the scheduled time, take it as soon as possible on the same day and twice-daily administration should be resumed. The dose should not be doubled to make up for a missed dose.  Important Safety Information A possible side effect of Eliquis is bleeding. You should call your healthcare provider right away if you experience any of the following: ? Bleeding from an injury or your nose that does not stop. ? Unusual colored urine (red or dark brown) or unusual colored stools (red or black). ? Unusual bruising for unknown  reasons. ? A serious fall or if you hit your head (even if there is no bleeding).  Some medicines may interact with Eliquis and might increase your risk of bleeding or clotting while on Eliquis. To help avoid this, consult your healthcare provider or pharmacist prior to using any new prescription or non-prescription medications, including herbals, vitamins, non-steroidal anti-inflammatory drugs (NSAIDs) and supplements.  This website has more information on Eliquis (apixaban): http://www.eliquis.com/eliquis/home

## 2015-07-02 NOTE — Progress Notes (Signed)
PROGRESS NOTE  Stephanie [name] KZL:[id number] DOB: 1950-04-24 DOA: 06/27/2015 PCP: [name], MD  Brief Narrative: 71 yof with medical history of DM2, HTN, FM, DJD, and cataracts presented after bilateral LE DVT was found on LE doppler as outpatient. She has been referred for admission.   Assessment/Plan: 1. Bilateral LE DVT. Doing well, asymptomatic. Continue Eliquis. 2. Acute kidney injury, resolved. Will follow up with neurology as an outpatient. 3. Nephrotic syndrome. Chronic rash. Further management per nephrology. Outpatient follow-up for skin biopsy recommended. 4. C. difficile colitis, given clinical history. Improving clinically. Had antibiotics for sinusitis in the last month and while taking them her diarrhea has substantially increased.  5. Hypokalemia. Resolved.  6. Essential HTN, remained stable. 7. DM type 2. Hypoglycemia resolved. Continue oral agent. Hold insulin on discharge. Tobacco use disorder 8. Obesity unspecified 9. Hypothyroidism. Continue levothyroxine.   Doing well. Plan home today.  Management of nephrotic syndrome and workup per nephrology. Continue oral diuretics. She will follow up as an outpatient in 4 weeks.  Hold home insulin and monitor blood sugars, continue oral agent   Will discontinue aspirin due to anticoagulation therapy  Continue oral vancomycin  Recommend compressionhose bilaterally.   DVT prophylaxis: Eliquis  Code Status: Full Family Communication: No family bedside.  Disposition Plan: Anticipate discharge within 24 hours.   [name], MD  [facility] Direct contact:  --Via amion app OR  --[email or web address]; password [email or web address] and click  [email or web address] contact night coverage as above 07/02/2015, 6:57 AM  LOS: 5 days   Consultants:  Nephrology   Procedures:  None  Antimicrobials:  Oral Vancomycin 5/14>> 5/27  HPI/Subjective: Her legs have been hurting. She is still experiencing diarrhea, though it is slightly  improved from a week ago. She is able to eat. LE edema has improved. Get short of breath and starts coughing when she moves about. Stomach does not hurt, except during BM.  Objective: Filed Vitals:   07/01/15 2013 07/01/15 2125 07/02/15 0631 07/02/15 0634  BP:  138/69 117/67   Pulse:  85 86   Temp:  97.7 F (36.5 C) 98.3 F (36.8 C)   TempSrc:  Oral Oral   Resp:  20 20   Height:      Weight:    110.723 kg (244 lb 1.6 oz)  SpO2: 99% 100% 98%     Intake/Output Summary (Last 24 hours) at 07/02/15 0657 Last data filed at 07/01/15 1900  Gross per 24 hour  Intake    480 ml  Output   1950 ml  Net  -1470 ml     Filed Weights   06/30/15 0609 07/01/15 0451 07/02/15 0634  Weight: 110.269 kg (243 lb 1.6 oz) 110.904 kg (244 lb 8 oz) 110.723 kg (244 lb 1.6 oz)    Exam:  Constitutional:  . Appears calm and comfortable Respiratory:  . CTA bilaterally, no w/r/r.  . Respiratory effort normal. No retractions or accessory muscle use Cardiovascular:  . RRR, no m/r/g . 2+ bilateral LE extremity edema  I have personally reviewed following labs and imaging studies:  Potassium 3.8, remainder BMP unremarkable.   Glucose 205. Stable  Hgb 9.3, slightly lower. Stable  WBC 9.9  Scheduled Meds: . apixaban  10 mg Oral BID   Followed by  . [START ON 07/06/2015] apixaban  5 mg Oral BID  . DULoxetine  30 mg Oral BID  . feeding supplement  1 Container Oral TID BM  . insulin aspart  0-9 Units Subcutaneous TID WC  .  levothyroxine  100 mcg Oral QAC breakfast  . losartan  100 mg Oral Daily  . metoprolol  100 mg Oral BID  . pantoprazole  40 mg Oral Daily  . potassium chloride  20 mEq Oral Daily  . pravastatin  40 mg Oral Daily  . sodium chloride flush  3 mL Intravenous Q12H  . sodium chloride flush  3 mL Intravenous Q12H  . torsemide  40 mg Oral Daily  . vancomycin  125 mg Oral QID  . vitamin C  1,000 mg Oral Daily  . vitamin E  400 Units Oral Daily   Continuous Infusions:     Principal Problem:   DVT (deep venous thrombosis), bilateral Active Problems:   Hypertension   Tobacco abuse   Diabetes (HCC)   Nephrotic range proteinuria   Maculopapular rash, generalized   Alopecia   Hypoglycemia   LOS: 5 days     By signing my name below, I, [name], attest that this documentation has been prepared under the direction and in the presence of Daun Rens P. Sarajane Jews, MD. Electronically Signed: [name], Scribe.  07/02/2015 10:55am   I personally performed the services described in this documentation. All medical record entries made by the scribe were at my direction. I have reviewed the chart and agree that the record reflects my personal performance and is accurate and complete. [name], MD

## 2015-07-02 NOTE — Care Management (Addendum)
Faxed Rx to [facility] and [facility] [employer] for [employer]. [facility] RX will be less than $75. Patient has Medicare/ Medicaid and [employer] supplement ins. Eliquis will require pre AUTh,  Gave 30 day free trial card.  Patient stated that she could afford meds co pay.  Discussed case with attending Dr Sarajane Jews.  Patient will be discharge today with family.

## 2015-07-02 NOTE — Progress Notes (Signed)
Subjective: Interval History: Patient continuously improving. Her diarrhea is about 5-6 times yesterday which is much better. Presently she doesn't have any nausea or vomiting.  Objective: Vital signs in last 24 hours: Temp:  [97.7 F (36.5 C)-98.3 F (36.8 C)] 98.3 F (36.8 C) (05/16 0631) Pulse Rate:  [84-86] 86 (05/16 0631) Resp:  [20] 20 (05/16 0631) BP: (117-138)/(62-69) 117/67 mmHg (05/16 0631) SpO2:  [98 %-100 %] 98 % (05/16 0631) Weight:  [110.723 kg (244 lb 1.6 oz)] 110.723 kg (244 lb 1.6 oz) (05/16 0634) Weight change: -0.181 kg (-6.4 oz)  Intake/Output from previous day: 05/15 0701 - 05/16 0700 In: 480 [P.O.:480] Out: 1950 [Urine:1950] Intake/Output this shift:    Generally patient is alert and in no apparent distress Chest is clear to auscultation Heart exam regular rate and rhythm Extremities she has 2+ edema  Lab Results:  Recent Labs  07/01/15 0459 07/02/15 0440  WBC 10.0 9.9  HGB 9.6* 9.3*  HCT 29.5* 28.7*  PLT 357 268   BMET:   Recent Labs  07/01/15 0449 07/02/15 0440  NA 135 135  K 3.5 3.8  CL 109 109  CO2 23 23  GLUCOSE 87 205*  BUN 14 14  CREATININE 0.87 0.91  CALCIUM 7.5* 7.6*   No results for input(s): PTH in the last 72 hours. Iron Studies: No results for input(s): IRON, TIBC, TRANSFERRIN, FERRITIN in the last 72 hours.  Studies/Results: No results found.  I have reviewed the patient's current medications.  Assessment/Plan: Problem #1 acute kidney injury: Renal function has recovered. Problem #2 hypokalemia: Patient is on potassium supplement. Her potassium has normalized. Problem #3 history of diarrhea, nausea vomiting. She is progressively improving. Problem #4 hypertension: Her blood pressure is reasonably controlled Problem #5 nephrotic range proteinuria: Hepatitis C antibodies negative, HIV, has a DNA is also negative. Other workup presently spending. Elevated IgA. HSP? Problem #6 possible vasculitis. Workup is  pending. Problem #7 history of DVT she is on heparin Problem #8 history of diabetes: Her blood sugar is reasonably controlled Problem #9 nephrotic syndrome: Patient is started on Cozaar and also she is on Demadex. Patient had about 2 L of urine output. Still she has significant leg swelling. But patient denies any difficulty breathing. Plan: 1] will continue his Demadex 2] will admit her as on 5 mg by mouth daily 3] patient advised to cut down her salt and fluid intake 4] will check renal panel in the morning.   LOS: 5 days   Aissatou Fronczak S 07/02/2015,8:55 AM

## 2015-07-04 LAB — CRYOGLOBULIN

## 2015-07-11 DIAGNOSIS — E1122 Type 2 diabetes mellitus with diabetic chronic kidney disease: Secondary | ICD-10-CM | POA: Diagnosis not present

## 2015-07-11 DIAGNOSIS — N189 Chronic kidney disease, unspecified: Secondary | ICD-10-CM | POA: Diagnosis not present

## 2015-07-11 DIAGNOSIS — L309 Dermatitis, unspecified: Secondary | ICD-10-CM | POA: Diagnosis not present

## 2015-07-11 DIAGNOSIS — E669 Obesity, unspecified: Secondary | ICD-10-CM | POA: Diagnosis not present

## 2015-07-22 ENCOUNTER — Ambulatory Visit (INDEPENDENT_AMBULATORY_CARE_PROVIDER_SITE_OTHER): Payer: Medicare Other | Admitting: Adult Health

## 2015-07-22 ENCOUNTER — Encounter: Payer: Self-pay | Admitting: Adult Health

## 2015-07-22 ENCOUNTER — Other Ambulatory Visit (HOSPITAL_COMMUNITY)
Admission: RE | Admit: 2015-07-22 | Discharge: 2015-07-22 | Disposition: A | Discharge status: Home / Self Care | Payer: Medicare Other | Source: Ambulatory Visit | Attending: Nephrology | Admitting: Nephrology

## 2015-07-22 VITALS — BP 100/60 | HR 81 | Ht 68.0 in | Wt 202.0 lb

## 2015-07-22 DIAGNOSIS — D649 Anemia, unspecified: Secondary | ICD-10-CM | POA: Insufficient documentation

## 2015-07-22 DIAGNOSIS — N179 Acute kidney failure, unspecified: Secondary | ICD-10-CM | POA: Diagnosis not present

## 2015-07-22 DIAGNOSIS — E876 Hypokalemia: Secondary | ICD-10-CM | POA: Insufficient documentation

## 2015-07-22 DIAGNOSIS — R809 Proteinuria, unspecified: Secondary | ICD-10-CM | POA: Diagnosis not present

## 2015-07-22 DIAGNOSIS — I10 Essential (primary) hypertension: Secondary | ICD-10-CM | POA: Diagnosis not present

## 2015-07-22 DIAGNOSIS — I5032 Chronic diastolic (congestive) heart failure: Secondary | ICD-10-CM | POA: Diagnosis not present

## 2015-07-22 LAB — HEMOGLOBIN AND HEMATOCRIT, BLOOD
HEMATOCRIT: 32.7 % — AB (ref 36.0–46.0)
Hemoglobin: 10.2 g/dL — ABNORMAL LOW (ref 12.0–15.0)

## 2015-07-22 LAB — RENAL FUNCTION PANEL
ALBUMIN: 1.8 g/dL — AB (ref 3.5–5.0)
Anion gap: 5 (ref 5–15)
BUN: 33 mg/dL — ABNORMAL HIGH (ref 6–20)
CALCIUM: 8.5 mg/dL — AB (ref 8.9–10.3)
CO2: 26 mmol/L (ref 22–32)
CREATININE: 1.43 mg/dL — AB (ref 0.44–1.00)
Chloride: 103 mmol/L (ref 101–111)
GFR, EST AFRICAN AMERICAN: 44 mL/min — AB (ref >60)
GFR, EST NON AFRICAN AMERICAN: 38 mL/min — AB (ref >60)
Glucose, Bld: 191 mg/dL — ABNORMAL HIGH (ref 65–99)
PHOSPHORUS: 4.9 mg/dL — AB (ref 2.5–4.6)
Potassium: 4.7 mmol/L (ref 3.5–5.1)
SODIUM: 134 mmol/L — AB (ref 135–145)

## 2015-07-22 LAB — PROTEIN / CREATININE RATIO, URINE
CREATININE, URINE: 32.22 mg/dL
Protein Creatinine Ratio: 7.17 mg/mg{Cre} — ABNORMAL HIGH (ref 0.00–0.15)
TOTAL PROTEIN, URINE: 231 mg/dL

## 2015-07-22 NOTE — Progress Notes (Signed)
Name: Stephanie [name]    DOB: Oct 18, 1950  Age: 65 y.o.  MR#: [id number]       PCP:  [name], MD      Insurance: Payor: MEDICARE / Plan: MEDICARE PART A AND B / Product Type: *No Product type* /   CC:   No chief complaint on file.   VS Filed Vitals:   07/22/15 1600  BP: 100/60  Pulse: 81  Height: 5\' 8"  (1.727 m)  Weight: 202 lb (91.627 kg)  SpO2: 99%    Weights Current Weight  07/22/15 202 lb (91.627 kg)  07/02/15 244 lb 1.6 oz (110.723 kg)  05/26/15 200 lb (90.719 kg)    Blood Pressure  BP Readings from Last 3 Encounters:  07/22/15 100/60  07/02/15 120/73  05/27/15 126/82     Admit date:  (Not on file) Last encounter with RMR:  06/24/2015   Allergy Tetracyclines & related; Banana; and Penicillins  Current Outpatient Prescriptions  Medication Sig Dispense Refill  . albuterol (PROVENTIL HFA;VENTOLIN HFA) 108 (90 BASE) MCG/ACT inhaler Inhale 2 puffs into the lungs every 6 (six) hours as needed for wheezing or shortness of breath.    Marland Kitchen albuterol (PROVENTIL) (2.5 MG/3ML) 0.083% nebulizer solution Take 2.5 mg by nebulization every 6 (six) hours as needed for wheezing or shortness of breath.    Marland Kitchen apixaban (ELIQUIS) 5 MG TABS tablet Take 10 mg by mouth BID through Friday 5/19 evening. Take 5mg  by mouth BID starting 5/20 AM. 65 tablet 0  . Bilberry, Vaccinium myrtillus, 100 MG CAPS Take 1 capsule by mouth 2 (two) times daily.    . cetirizine (ZYRTEC) 10 MG tablet Take 10 mg by mouth daily.    . Cholecalciferol (VITAMIN D) 1000 UNITS capsule Take 1,000 Units by mouth 2 (two) times daily.     . clobetasol ointment (TEMOVATE) [id number] % Apply 1 application topically 2 (two) times daily.     . Cyanocobalamin (VITAMIN B-12 PO) Take 1 tablet by mouth daily.      . DULoxetine (CYMBALTA) 30 MG capsule Take 30 mg by mouth 2 (two) times daily.     Marland Kitchen HYDROcodone-acetaminophen (NORCO/VICODIN) 5-325 MG tablet Take 1 tablet by mouth every 4 (four) hours as needed. 15 tablet 0  . hydrOXYzine  (ATARAX/VISTARIL) 25 MG tablet Take 25 mg by mouth every 8 (eight) hours as needed.     . JENTADUETO 2.5-500 MG TABS Take 1 tablet by mouth 2 (two) times daily.     Marland Kitchen levothyroxine (SYNTHROID, LEVOTHROID) 100 MCG tablet Take 100 mcg by mouth daily before breakfast.   0  . Melatonin 5 MG CAPS Take 2 capsules by mouth at bedtime.    . metolazone (ZAROXOLYN) 5 MG tablet Take 1 tablet (5 mg total) by mouth 2 (two) times daily. 60 tablet 0  . metoprolol (LOPRESSOR) 100 MG tablet TAKE 1 TABLET BY MOUTH TWICE DAILY **TAKE WITH 25MG  TABLET FOR TOTAL DOSAGE OF 125MG ** 180 tablet 1  . mupirocin ointment (BACTROBAN) 2 % Apply 1 application topically 2 (two) times daily.     . pantoprazole (PROTONIX) 40 MG tablet Take 40 mg by mouth daily.     . potassium chloride SA (K-DUR,KLOR-CON) 20 MEQ tablet Take 1 tablet (20 mEq total) by mouth daily. 30 tablet 0  . pravastatin (PRAVACHOL) 40 MG tablet Take 40 mg by mouth daily.     Marland Kitchen PULMICORT FLEXHALER 180 MCG/ACT inhaler Inhale 2 puffs into the lungs daily as needed (for shortness of breath-wheezing).     Marland Kitchen  torsemide (DEMADEX) 20 MG tablet Take 2 tablets (40 mg total) by mouth daily. 60 tablet 0  . traMADol (ULTRAM) 50 MG tablet Take 1 tablet (50 mg total) by mouth every 6 (six) hours as needed. 16 tablet 0  . vitamin C (ASCORBIC ACID) 500 MG tablet Take 1,000 mg by mouth daily.    . vitamin E 400 UNIT capsule Take 400 Units by mouth daily.     No current facility-administered medications for this visit.    Discontinued Meds:    Medications Discontinued During This Encounter  Medication Reason  . lisinopril (PRINIVIL,ZESTRIL) 10 MG tablet Error  . vancomycin (VANCOCIN) 50 mg/mL oral solution Error    Patient Active Problem List   Diagnosis Date Noted  . Hypoglycemia 07/01/2015  . DVT (deep venous thrombosis), bilateral 06/27/2015  . 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 Maculopapular rash, generalized 06/27/2015  . Alopecia 06/27/2015  .  Hypothyroidism 06/27/2015  . Diverticulitis 10/19/2013  . Tobacco abuse 10/19/2013  . Obesity 10/19/2013  . Diabetes ([place]) 10/19/2013  . Hypertension   . Anemia   . GERD (gastroesophageal reflux disease)   . COPD (chronic obstructive pulmonary disease) (HCC)     LABS    Component Value Date/Time   NA 135 07/02/2015 0440   NA 135 07/01/2015 0449   NA 134* 06/30/2015 0619   K 3.8 07/02/2015 0440   K 3.5 07/01/2015 0449   K 2.9* 06/30/2015 0619   CL 109 07/02/2015 0440   CL 109 07/01/2015 0449   CL 108 06/30/2015 0619   CO2 23 07/02/2015 0440   CO2 23 07/01/2015 0449   CO2 23 06/30/2015 0619   GLUCOSE 205* 07/02/2015 0440   GLUCOSE 87 07/01/2015 0449   GLUCOSE 73 06/30/2015 0619   BUN 14 07/02/2015 0440   BUN 14 07/01/2015 0449   BUN 13 06/30/2015 0619   CREATININE 0.91 07/02/2015 0440   CREATININE 0.87 07/01/2015 0449   CREATININE 0.85 06/30/2015 0619   CALCIUM 7.6* 07/02/2015 0440   CALCIUM 7.5* 07/01/2015 0449   CALCIUM 7.6* 06/30/2015 0619   GFRNONAA >60 07/02/2015 0440   GFRNONAA >60 07/01/2015 0449   GFRNONAA >60 06/30/2015 0619   GFRAA >60 07/02/2015 0440   GFRAA >60 07/01/2015 0449   GFRAA >60 06/30/2015 0619   CMP     Component Value Date/Time   NA 135 07/02/2015 0440   K 3.8 07/02/2015 0440   CL 109 07/02/2015 0440   CO2 23 07/02/2015 0440   GLUCOSE 205* 07/02/2015 0440   BUN 14 07/02/2015 0440   CREATININE 0.91 07/02/2015 0440   CALCIUM 7.6* 07/02/2015 0440   PROT 6.6 06/27/2015 2020   ALBUMIN 1.0* 07/02/2015 0440   AST 21 06/27/2015 2020   ALT 20 06/27/2015 2020   ALKPHOS 356* 06/27/2015 2020   BILITOT 0.4 06/27/2015 2020   GFRNONAA >60 07/02/2015 0440   GFRAA >60 07/02/2015 0440       Component Value Date/Time   WBC 9.9 07/02/2015 0440   WBC 10.0 07/01/2015 0459   WBC 11.6* 06/30/2015 0619   HGB 9.3* 07/02/2015 0440   HGB 9.6* 07/01/2015 0459   HGB 11.5* 06/30/2015 0619   HCT 28.7* 07/02/2015 0440   HCT 29.5* 07/01/2015 0459   HCT  36.0 06/30/2015 0619   MCV 81.8 07/02/2015 0440   MCV 81.3 07/01/2015 0459   MCV 80.4 06/30/2015 0619    Lipid Panel  No results found for: CHOL, TRIG, HDL, CHOLHDL, VLDL, LDLCALC, LDLDIRECT  ABG No  results found for: PHART, PCO2ART, PO2ART, HCO3, TCO2, ACIDBASEDEF, O2SAT   Lab Results  Component Value Date   TSH 2.290 10/19/2013   BNP (last 3 results) No results for input(s): BNP in the last 8760 hours.  ProBNP (last 3 results) No results for input(s): PROBNP in the last 8760 hours.  Cardiac Panel (last 3 results) No results for input(s): CKTOTAL, CKMB, TROPONINI, RELINDX in the last 72 hours.  Iron/TIBC/Ferritin/ %Sat No results found for: IRON, TIBC, FERRITIN, IRONPCTSAT   EKG Orders placed or performed in visit on 07/22/15  . EKG 12-Lead     Prior Assessment and Plan Problem List as of 07/22/2015      Cardiovascular and Mediastinum   Hypertension   DVT (deep venous thrombosis), bilateral     Respiratory   COPD (chronic obstructive pulmonary disease) (HCC)     Digestive   GERD (gastroesophageal reflux disease)     Endocrine   Hypothyroidism   Diabetes (HCC)   Hypoglycemia     Musculoskeletal and Integument   Maculopapular rash, generalized   Alopecia     Other   Diverticulitis   Anemia   Tobacco abuse   Obesity   Nephrotic range proteinuria       Imaging: US Venous Img Lower Bilateral  06/27/2015  CLINICAL DATA:  Bilateral leg swelling, initial encounter EXAM: BILATERAL LOWER EXTREMITY VENOUS DOPPLER ULTRASOUND TECHNIQUE: Gray-scale sonography with graded compression, as well as color Doppler and duplex ultrasound were performed to evaluate the lower extremity deep venous systems from the level of the common femoral vein and including the common femoral, femoral, profunda femoral, popliteal and calf veins including the posterior tibial, peroneal and gastrocnemius veins when visible. The superficial great saphenous vein was also interrogated. Spectral  Doppler was utilized to evaluate flow at rest and with distal augmentation maneuvers in the common femoral, femoral and popliteal veins. COMPARISON:  None. FINDINGS: RIGHT LOWER EXTREMITY Common Femoral Vein: No evidence of thrombus. Normal compressibility, respiratory phasicity and response to augmentation. Saphenofemoral Junction: No evidence of thrombus. Normal compressibility and flow on color Doppler imaging. Profunda Femoral Vein: No evidence of thrombus. Normal compressibility and flow on color Doppler imaging. Femoral Vein: No evidence of thrombus. Normal compressibility, respiratory phasicity and response to augmentation. Popliteal Vein: No evidence of thrombus. Normal compressibility, respiratory phasicity and response to augmentation. Calf Veins: Increased echogenicity is noted within the posterior tibial vein with decreased compressibility consistent with thrombus. Superficial Great Saphenous Vein: No evidence of thrombus. Normal compressibility and flow on color Doppler imaging. Venous Reflux:  None. Other Findings: Generalized subcutaneous edema is noted within the calf. LEFT LOWER EXTREMITY Common Femoral Vein: No evidence of thrombus. Normal compressibility, respiratory phasicity and response to augmentation. Saphenofemoral Junction: No evidence of thrombus. Normal compressibility and flow on color Doppler imaging. Profunda Femoral Vein: No evidence of thrombus. Normal compressibility and flow on color Doppler imaging. Femoral Vein: No evidence of thrombus. Normal compressibility, respiratory phasicity and response to augmentation. Popliteal Vein: No evidence of thrombus. Normal compressibility, respiratory phasicity and response to augmentation. Calf Veins: Increased echogenicity is noted within the posterior tibial vein with decreased compressibility consistent with thrombus. Superficial Great Saphenous Vein: No evidence of thrombus. Normal compressibility and flow on color Doppler imaging. Venous  Reflux:  None. Other Findings: Generalized subcutaneous edema is noted within the calf. Multiple prominent lymph nodes are noted in the left groin consistent with reactive nodes. IMPRESSION: Bilateral posterior tibial vein thrombus. Electronically Signed   By: Linus Mako.D.  On: 06/27/2015 15:15

## 2015-07-22 NOTE — Patient Instructions (Addendum)
Your physician wants you to follow-up in: 3 Months with Dr. Bronson Ing. You will receive a reminder letter in the mail two months in advance. If you don't receive a letter, please call our office to schedule the follow-up appointment.  Your physician recommends that you schedule a follow-up appointment with Dr. Harold Barban. @ 10:00  If you need a refill on your cardiac medications before your next appointment, please call your pharmacy.  Thank you for choosing [facility]!

## 2015-07-22 NOTE — Progress Notes (Signed)
Cardiology Office Note   Date:  07/22/2015   ID:  Stephanie [name], DOB 27-Feb-1950, MRN [id number]  PCP:  [name], MD  Cardiologist:   Woodroe Chen, NP   No chief complaint on file.     History of Present Illness: Stephanie [name] is a 65 y.o. female who presents for ongoing assessment and management of tachycardia, history of grade 1 diastolic dysfunction, trivial valvular regurgitation. The patient was last seen in the office on 06/25/2014. At that time the patient complained of recurrent tachycardia and chest pain was mild lower extremity edema for which she is taking Lasix.at the time however her symptoms had resolved. No medication changes were made. She was advised on smoking cessation and limiting Lasix to avoid dehydration.  She has been seen by nephrology who has taken her off of Lasix and place her on Demadex and metolazone daily. She has lost an additional 30 pounds. The patient is feeling much better and breathing better. Labs are being drawn by nephrology. She denies any bleeding or melena. She has no recurrent chest pain.  Past Medical History  Diagnosis Date  . Diabetes mellitus   . Hypertension   . Fibromyalgia   . Asthma   . PONV (postoperative nausea and vomiting)   . Hyperthyroidism   . Anemia   . H/O hiatal hernia   . GERD (gastroesophageal reflux disease)   . COPD (chronic obstructive pulmonary disease) ([place])   . Inner ear disease   . IBS (irritable bowel syndrome)   . Tachycardia     Past Surgical History  Procedure Laterality Date  . Cholecystectomy    . Abdominal hysterectomy  partial  . Carpal tunnel release Right 1991  . Wrist ganglion excision Left   . Cataract extraction w/phaco Right 05/08/2013    Procedure: CATARACT EXTRACTION PHACO AND INTRAOCULAR LENS PLACEMENT (IOC);  Surgeon: Tonny Branch, MD;  Location: [facility];  Service: Ophthalmology;  Laterality: Right;  CDE 10.31  . Cataract extraction w/phaco Left 08/17/2013    Procedure:  CATARACT EXTRACTION PHACO AND INTRAOCULAR LENS PLACEMENT (IOC);  Surgeon: Tonny Branch, MD;  Location: [facility];  Service: Ophthalmology;  Laterality: Left;  CDE:9.03  . Dental surgery       Current Outpatient Prescriptions  Medication Sig Dispense Refill  . albuterol (PROVENTIL HFA;VENTOLIN HFA) 108 (90 BASE) MCG/ACT inhaler Inhale 2 puffs into the lungs every 6 (six) hours as needed for wheezing or shortness of breath.    Marland Kitchen albuterol (PROVENTIL) (2.5 MG/3ML) 0.083% nebulizer solution Take 2.5 mg by nebulization every 6 (six) hours as needed for wheezing or shortness of breath.    Marland Kitchen apixaban (ELIQUIS) 5 MG TABS tablet Take 10 mg by mouth BID through Friday 5/19 evening. Take 5mg  by mouth BID starting 5/20 AM. 65 tablet 0  . Bilberry, Vaccinium myrtillus, 100 MG CAPS Take 1 capsule by mouth 2 (two) times daily.    . cetirizine (ZYRTEC) 10 MG tablet Take 10 mg by mouth daily.    . Cholecalciferol (VITAMIN D) 1000 UNITS capsule Take 1,000 Units by mouth 2 (two) times daily.     . clobetasol ointment (TEMOVATE) [id number] % Apply 1 application topically 2 (two) times daily.     . Cyanocobalamin (VITAMIN B-12 PO) Take 1 tablet by mouth daily.      . DULoxetine (CYMBALTA) 30 MG capsule Take 30 mg by mouth 2 (two) times daily.     Marland Kitchen HYDROcodone-acetaminophen (NORCO/VICODIN) 5-325 MG tablet Take 1 tablet by mouth  every 4 (four) hours as needed. 15 tablet 0  . hydrOXYzine (ATARAX/VISTARIL) 25 MG tablet Take 25 mg by mouth every 8 (eight) hours as needed.     . JENTADUETO 2.5-500 MG TABS Take 1 tablet by mouth 2 (two) times daily.     Marland Kitchen levothyroxine (SYNTHROID, LEVOTHROID) 100 MCG tablet Take 100 mcg by mouth daily before breakfast.   0  . Melatonin 5 MG CAPS Take 2 capsules by mouth at bedtime.    . metolazone (ZAROXOLYN) 5 MG tablet Take 1 tablet (5 mg total) by mouth 2 (two) times daily. 60 tablet 0  . metoprolol (LOPRESSOR) 100 MG tablet TAKE 1 TABLET BY MOUTH TWICE DAILY **TAKE WITH 25MG  TABLET FOR TOTAL  DOSAGE OF 125MG ** 180 tablet 1  . mupirocin ointment (BACTROBAN) 2 % Apply 1 application topically 2 (two) times daily.     . pantoprazole (PROTONIX) 40 MG tablet Take 40 mg by mouth daily.     . potassium chloride SA (K-DUR,KLOR-CON) 20 MEQ tablet Take 1 tablet (20 mEq total) by mouth daily. 30 tablet 0  . pravastatin (PRAVACHOL) 40 MG tablet Take 40 mg by mouth daily.     Marland Kitchen PULMICORT FLEXHALER 180 MCG/ACT inhaler Inhale 2 puffs into the lungs daily as needed (for shortness of breath-wheezing).     . torsemide (DEMADEX) 20 MG tablet Take 2 tablets (40 mg total) by mouth daily. 60 tablet 0  . traMADol (ULTRAM) 50 MG tablet Take 1 tablet (50 mg total) by mouth every 6 (six) hours as needed. 16 tablet 0  . vitamin C (ASCORBIC ACID) 500 MG tablet Take 1,000 mg by mouth daily.    . vitamin E 400 UNIT capsule Take 400 Units by mouth daily.     No current facility-administered medications for this visit.    Allergies:   Tetracyclines & related; Banana; and Penicillins    Social History:  The patient  reports that she quit smoking about 5 months ago. Her smoking use included Cigarettes. She has a 7.5 pack-year smoking history. She has never used smokeless tobacco. She reports that she does not drink alcohol or use illicit drugs.   Family History:  The patient's family history is not on file.    ROS: All other systems are reviewed and negative. Unless otherwise mentioned in H&P    PHYSICAL EXAM: VS:  BP 100/60 mmHg  Pulse 81  Ht 5\' 8"  (1.727 m)  Wt 202 lb (91.627 kg)  BMI 30.72 kg/m2  SpO2 99% , BMI Body mass index is 30.72 kg/(m^2). GEN: Well nourished, well developed, in no acute distress HEENT: normal Neck: no JVD, carotid bruits, or masses Cardiac: RRR; no murmurs, rubs, or gallops,no edema  Respiratory: Clear to auscultation bilaterally, normal work of breathing GI: soft, nontender, nondistended, + BS MS: no deformity or atrophyshe is wearing support hose.  Skin: warm and dry, no  rash Neuro:  Strength and sensation are intact Psych: euthymic mood, full affect  Recent Labs: 06/27/2015: ALT 20 07/02/2015: BUN 14; Creatinine, Ser 0.91; Hemoglobin 9.3*; Platelets 268; Potassium 3.8; Sodium 135    Lipid Panel No results found for: CHOL, TRIG, HDL, CHOLHDL, VLDL, LDLCALC, LDLDIRECT    Wt Readings from Last 3 Encounters:  07/22/15 202 lb (91.627 kg)  07/02/15 244 lb 1.6 oz (110.723 kg)  05/26/15 200 lb (90.719 kg)     ASSESSMENT AND PLAN:  1. Chronic diastolic heart failure: the patient has been followed by nephrology and has had diuretics completely changed.  She is now on torsemide 40 mg by mouth daily and metolazone 5 mg twice a day. Labs are being drawn by nephrology. I will not make any changes in her diuretics at this time as it appears that nephrology is managing her fluid.  2. DVT: continues on ELIQUIS. The patient denies any bleeding or melena.  3. Hypertension:blood pressure soft. She states this is a normal blood pressure for her when she does not have fluid overload. I am concerned about perfusion with his local blood pressure. I would like to decrease her metoprolol but she wants to wait and let nephrology handle this as well.   Current medicines are reviewed at length with the patient today.    Labs/ tests ordered today include: None  Orders Placed This Encounter  Procedures  . EKG 12-Lead     Disposition:   FU with 3 months. Signed, Jory Sims, NP  07/22/2015 4:10 PM    [facility] [address] [place], [place] [address] Phone: [phone]; Fax: [phone]

## 2015-07-23 ENCOUNTER — Encounter: Payer: Self-pay | Admitting: Gastroenterology

## 2015-07-23 ENCOUNTER — Ambulatory Visit (INDEPENDENT_AMBULATORY_CARE_PROVIDER_SITE_OTHER): Payer: Medicare Other | Admitting: Gastroenterology

## 2015-07-23 VITALS — BP 126/70 | HR 79 | Temp 97.5°F | Ht 68.0 in | Wt 200.6 lb

## 2015-07-23 DIAGNOSIS — R195 Other fecal abnormalities: Secondary | ICD-10-CM | POA: Diagnosis not present

## 2015-07-23 DIAGNOSIS — R748 Abnormal levels of other serum enzymes: Secondary | ICD-10-CM

## 2015-07-23 DIAGNOSIS — D649 Anemia, unspecified: Secondary | ICD-10-CM | POA: Diagnosis not present

## 2015-07-23 DIAGNOSIS — A09 Infectious gastroenteritis and colitis, unspecified: Secondary | ICD-10-CM | POA: Diagnosis not present

## 2015-07-23 MED ORDER — HYDROCORTISONE 2.5 % RE CREA: 1.0000 "application " | TOPICAL_CREAM | Freq: Two times a day (BID) | RECTAL | Status: DC

## 2015-07-23 NOTE — Patient Instructions (Signed)
Start taking supplemental fiber such as Metamucil or Benefiber every day.   I have sent in a cream to use per rectum twice a day for 5-7 days.   I will need to review other labs you have had drawn, and we will likely need more.   You will need a colonoscopy and possible upper endoscopy in the near future, but I would rather wait on this till we can hold your anticoagulation safely for a few days. This may be a few months from now. I will discuss with Dr. Gala Romney.   We will see you in 3 months!

## 2015-07-23 NOTE — Progress Notes (Signed)
Primary Care Physician:  [name], MD Primary Gastroenterologist:  Dr. Gala Romney   Chief Complaint  Patient presents with  . Diarrhea    HPI:   Stephanie [name] is a 65 y.o. female presenting today at the request of her PCP secondary to fecal incontinence, heme positive stool.  Low-volume hematochezia intermittently since Jan/Feb 2017.  Last colonoscopy in the late 90s, early 2000s by [facility]. Had a BM that looked like coffee grounds. Mild lower abdominal discomfort. Diarrhea resolved. No N/V. Appetite is getting better. States at one point didn't want anything. Started improving last month. No dysphagia.   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 Was hospitalized in May 2017 with diarrhea and found to have Cdiff antigen positive but negative toxin. She was treated empirically and diarrhea resolved.  No further diarrhea or incontinence. Outside labs with Alk phos 314, Cr 1.42, Albumin 1.9. While inpatient, H/H in 9-11 range. US abdomen in April 2017 with fatty liver, no biliary duct dilatation. Isolated elevation of alk phos noted. Normal transaminases.     Past Medical History  Diagnosis Date  . Diabetes mellitus   . Hypertension   . Fibromyalgia   . Asthma   . PONV (postoperative nausea and vomiting)   . Hyperthyroidism   . Anemia   . H/O hiatal hernia   . GERD (gastroesophageal reflux disease)   . COPD (chronic obstructive pulmonary disease) ([place])   . Inner ear disease   . IBS (irritable bowel syndrome)   . Tachycardia     Past Surgical History  Procedure Laterality Date  . Cholecystectomy    . Abdominal hysterectomy  partial  . Carpal tunnel release Right 1991  . Wrist ganglion excision Left   . Cataract extraction w/phaco Right 05/08/2013    Procedure: CATARACT EXTRACTION PHACO AND INTRAOCULAR LENS PLACEMENT (IOC);  Surgeon: Tonny Branch, MD;  Location: [facility];  Service: Ophthalmology;  Laterality: Right;  CDE 10.31  . Cataract extraction w/phaco Left 08/17/2013    Procedure:  CATARACT EXTRACTION PHACO AND INTRAOCULAR LENS PLACEMENT (IOC);  Surgeon: Tonny Branch, MD;  Location: [facility];  Service: Ophthalmology;  Laterality: Left;  CDE:9.03  . Dental surgery      Current Outpatient Prescriptions  Medication Sig Dispense Refill  . albuterol (PROVENTIL HFA;VENTOLIN HFA) 108 (90 BASE) MCG/ACT inhaler Inhale 2 puffs into the lungs every 6 (six) hours as needed for wheezing or shortness of breath.    Marland Kitchen albuterol (PROVENTIL) (2.5 MG/3ML) 0.083% nebulizer solution Take 2.5 mg by nebulization every 6 (six) hours as needed for wheezing or shortness of breath.    . Bilberry, Vaccinium myrtillus, 100 MG CAPS Take 1 capsule by mouth 2 (two) times daily.    . cetirizine (ZYRTEC) 10 MG tablet Take 10 mg by mouth daily.    . Cholecalciferol (VITAMIN D) 1000 UNITS capsule Take 1,000 Units by mouth 2 (two) times daily.     . clobetasol ointment (TEMOVATE) 1.61 % Apply 1 application topically 2 (two) times daily.     . Cyanocobalamin (VITAMIN B-12 PO) Take 1 tablet by mouth daily.      . DULoxetine (CYMBALTA) 30 MG capsule Take 30 mg by mouth 2 (two) times daily.     Marland Kitchen HYDROcodone-acetaminophen (NORCO/VICODIN) 5-325 MG tablet Take 1 tablet by mouth every 4 (four) hours as needed. 15 tablet 0  . hydrOXYzine (ATARAX/VISTARIL) 25 MG tablet Take 25 mg by mouth every 8 (eight) hours as needed.     . JENTADUETO 2.5-500 MG  TABS Take 1 tablet by mouth 2 (two) times daily.     Marland Kitchen levothyroxine (SYNTHROID, LEVOTHROID) 100 MCG tablet Take 100 mcg by mouth daily before breakfast.   0  . Melatonin 5 MG CAPS Take 2 capsules by mouth at bedtime.    . metolazone (ZAROXOLYN) 5 MG tablet Take 1 tablet (5 mg total) by mouth 2 (two) times daily. 60 tablet 0  . metoprolol (LOPRESSOR) 100 MG tablet TAKE 1 TABLET BY MOUTH TWICE DAILY **TAKE WITH 25MG TABLET FOR TOTAL DOSAGE OF 125MG** 180 tablet 1  . mupirocin ointment (BACTROBAN) 2 % Apply 1 application topically 2 (two) times daily.     . pantoprazole  (PROTONIX) 40 MG tablet Take 40 mg by mouth daily.     . potassium chloride SA (K-DUR,KLOR-CON) 20 MEQ tablet Take 1 tablet (20 mEq total) by mouth daily. 30 tablet 0  . pravastatin (PRAVACHOL) 40 MG tablet Take 40 mg by mouth daily.     Marland Kitchen PULMICORT FLEXHALER 180 MCG/ACT inhaler Inhale 2 puffs into the lungs daily as needed (for shortness of breath-wheezing).     . torsemide (DEMADEX) 20 MG tablet Take 2 tablets (40 mg total) by mouth daily. 60 tablet 0  . traMADol (ULTRAM) 50 MG tablet Take 1 tablet (50 mg total) by mouth every 6 (six) hours as needed. 16 tablet 0  . vitamin C (ASCORBIC ACID) 500 MG tablet Take 1,000 mg by mouth daily.    . vitamin E 400 UNIT capsule Take 400 Units by mouth daily.    Marland Kitchen apixaban (ELIQUIS) 5 MG TABS tablet Take 10 mg by mouth BID through Friday 5/19 evening. Take 77m by mouth BID starting 5/20 AM. 60 tablet 3  . hydrocortisone (ANUSOL-HC) 2.5 % rectal cream Place 1 application rectally 2 (two) times daily. 30 g 1   No current facility-administered medications for this visit.    Allergies as of 07/23/2015 - Review Complete 07/23/2015  Allergen Reaction Noted  . Tetracyclines & related Anaphylaxis 09/19/2010  . Banana Hives and Nausea And Vomiting 05/04/2013  . Penicillins Rash 09/19/2010    Family History  Problem Relation Age of Onset  . Colon cancer Neg Hx     Social History   Social History  . Marital Status: Legally Separated    Spouse Name: N/A  . Number of Children: N/A  . Years of Education: N/A   Occupational History  . Not on file.   Social History Main Topics  . Smoking status: Former Smoker -- 0.25 packs/day for 30 years    Types: Cigarettes    Quit date: 02/18/2015  . Smokeless tobacco: Never Used     Comment: some day smoker  . Alcohol Use: No  . Drug Use: No  . Sexual Activity: Yes    Birth Control/ Protection: Surgical   Other Topics Concern  . Not on file   Social History Narrative    Review of Systems: Gen: Denies  any fever, chills, fatigue, weight loss, lack of appetite.  CV: +palpitations  Resp: occasional [place]  GI: see HPI  GU : Denies urinary burning, urinary frequency, urinary hesitancy MS: +joint pain  Derm: Denies rash, itching, dry skin Psych: Denies depression, anxiety, memory loss, and confusion Heme: Denies bruising, bleeding, and enlarged lymph nodes.  Physical Exam: BP 126/70 mmHg  Pulse 79  Temp(Src) 97.5 F (36.4 C)  Ht 5' 8"  (1.727 m)  Wt 200 lb 9.6 oz (90.992 kg)  BMI 30.51 kg/m2 General:   Alert  and oriented. Pleasant and cooperative. Well-nourished and well-developed.  Head:  Normocephalic and atraumatic. Eyes:  Without icterus, sclera clear and conjunctiva pink.  Ears:  Normal auditory acuity. Nose:  No deformity, discharge,  or lesions. Mouth:  No deformity or lesions, oral mucosa pink.  Lungs:  Clear to auscultation bilaterally. No wheezes, rales, or rhonchi. No distress.  Heart:  S1, S2 present without murmurs appreciated.  Abdomen:  +BS, soft, non-tender and non-distended. No HSM noted. No guarding or rebound. No masses appreciated.  Rectal:  Deferred  Msk:  Symmetrical without gross deformities. Normal posture. Extremities:  Without edema. Neurologic:  Alert and  oriented x4;  grossly normal neurologically. Psych:  Alert and cooperative. Normal mood and affect.  Lab Results  Component Value Date   WBC 9.9 07/02/2015   HGB 10.2* 07/22/2015   HCT 32.7* 07/22/2015   MCV 81.8 07/02/2015   PLT 268 07/02/2015   Lab Results  Component Value Date   ALT 20 06/27/2015   AST 21 06/27/2015   ALKPHOS 356* 06/27/2015   BILITOT 0.4 06/27/2015

## 2015-07-25 ENCOUNTER — Telehealth: Payer: Self-pay | Admitting: Adult Health

## 2015-07-25 MED ORDER — APIXABAN 5 MG PO TABS: ORAL_TABLET | ORAL | Status: DC

## 2015-07-25 NOTE — Telephone Encounter (Signed)
Refill complete 

## 2015-07-25 NOTE — Telephone Encounter (Signed)
°*  STAT* If patient is at the pharmacy, call can be transferred to refill team.   1. Which medications need to be refilled? (please list name of each medication and dose if known) apixaban (ELIQUIS) 5 MG TABS tablet [[id number]]   2. Which pharmacy/location (including street and city if local pharmacy) is medication to be sent to?   3. Do they need a 30 day or 90 day supply?

## 2015-08-02 ENCOUNTER — Telehealth: Payer: Self-pay | Admitting: Gastroenterology

## 2015-08-02 DIAGNOSIS — R197 Diarrhea, unspecified: Secondary | ICD-10-CM | POA: Insufficient documentation

## 2015-08-02 DIAGNOSIS — R748 Abnormal levels of other serum enzymes: Secondary | ICD-10-CM

## 2015-08-02 DIAGNOSIS — D649 Anemia, unspecified: Secondary | ICD-10-CM

## 2015-08-02 NOTE — Telephone Encounter (Signed)
Please have patient complete the following labs:  Iron, TIBC, ferritin, AMA, and GGT.   Thanks!  Orvil Feil, ANP-BC [facility]

## 2015-08-02 NOTE — Assessment & Plan Note (Signed)
Chronic microcytic anemia, no iron studies on file, heme positive. Very scant hematochezia intermittently. Will eventually need colonoscopy+/-EGD in near future; however, she was recently diagnosed with DVTs in May 2017 and has just started Eliquis. Procedures could be done on anticoagulation IF worsening anemia but would favor a conservative approach until it is appropriate to hold for 48 hours. Discussed this with patient. Will need to order iron, TIBC, ferritin now.

## 2015-08-02 NOTE — Assessment & Plan Note (Signed)
Isolated. Fatty liver on [place]. Check GGT and AMA.

## 2015-08-02 NOTE — Assessment & Plan Note (Signed)
Treated empirically for Cdiff with positive antigen and negative toxin; however, she had been previously exposed to antibiotics and clinically improved with empiric treatment. Diarrhea and incontinence resolved. Supplemental fiber daily, add Anusol cream due to scant hematochezia intermittently. Will eventually need colonoscopy once anticoagulation can be safely held.

## 2015-08-05 ENCOUNTER — Encounter (HOSPITAL_COMMUNITY): Payer: Medicare Other

## 2015-08-05 ENCOUNTER — Encounter (HOSPITAL_COMMUNITY): Payer: Medicare Other | Attending: Hematology | Admitting: Hematology

## 2015-08-05 ENCOUNTER — Encounter (HOSPITAL_COMMUNITY): Payer: Self-pay

## 2015-08-05 VITALS — BP 142/71 | HR 78 | Temp 98.0°F | Resp 16 | Ht 68.0 in | Wt 198.6 lb

## 2015-08-05 DIAGNOSIS — D649 Anemia, unspecified: Secondary | ICD-10-CM

## 2015-08-05 DIAGNOSIS — Z880 Allergy status to penicillin: Secondary | ICD-10-CM | POA: Diagnosis not present

## 2015-08-05 DIAGNOSIS — Z90710 Acquired absence of both cervix and uterus: Secondary | ICD-10-CM | POA: Insufficient documentation

## 2015-08-05 DIAGNOSIS — E039 Hypothyroidism, unspecified: Secondary | ICD-10-CM | POA: Insufficient documentation

## 2015-08-05 DIAGNOSIS — Z7901 Long term (current) use of anticoagulants: Secondary | ICD-10-CM | POA: Insufficient documentation

## 2015-08-05 DIAGNOSIS — Z79899 Other long term (current) drug therapy: Secondary | ICD-10-CM | POA: Diagnosis not present

## 2015-08-05 DIAGNOSIS — Z9049 Acquired absence of other specified parts of digestive tract: Secondary | ICD-10-CM | POA: Insufficient documentation

## 2015-08-05 DIAGNOSIS — R748 Abnormal levels of other serum enzymes: Secondary | ICD-10-CM

## 2015-08-05 DIAGNOSIS — Z9889 Other specified postprocedural states: Secondary | ICD-10-CM | POA: Insufficient documentation

## 2015-08-05 DIAGNOSIS — J449 Chronic obstructive pulmonary disease, unspecified: Secondary | ICD-10-CM | POA: Diagnosis not present

## 2015-08-05 DIAGNOSIS — R809 Proteinuria, unspecified: Secondary | ICD-10-CM

## 2015-08-05 DIAGNOSIS — I10 Essential (primary) hypertension: Secondary | ICD-10-CM | POA: Insufficient documentation

## 2015-08-05 DIAGNOSIS — N049 Nephrotic syndrome with unspecified morphologic changes: Secondary | ICD-10-CM | POA: Diagnosis not present

## 2015-08-05 DIAGNOSIS — E119 Type 2 diabetes mellitus without complications: Secondary | ICD-10-CM | POA: Diagnosis not present

## 2015-08-05 DIAGNOSIS — M797 Fibromyalgia: Secondary | ICD-10-CM | POA: Diagnosis not present

## 2015-08-05 DIAGNOSIS — I82403 Acute embolism and thrombosis of unspecified deep veins of lower extremity, bilateral: Secondary | ICD-10-CM | POA: Diagnosis not present

## 2015-08-05 DIAGNOSIS — D472 Monoclonal gammopathy: Secondary | ICD-10-CM

## 2015-08-05 DIAGNOSIS — Z87891 Personal history of nicotine dependence: Secondary | ICD-10-CM | POA: Insufficient documentation

## 2015-08-05 LAB — COMPREHENSIVE METABOLIC PANEL
ALK PHOS: 293 U/L — AB (ref 38–126)
ALT: 37 U/L (ref 14–54)
ANION GAP: 7 (ref 5–15)
AST: 29 U/L (ref 15–41)
Albumin: 2.3 g/dL — ABNORMAL LOW (ref 3.5–5.0)
BUN: 36 mg/dL — ABNORMAL HIGH (ref 6–20)
CALCIUM: 8.9 mg/dL (ref 8.9–10.3)
CHLORIDE: 102 mmol/L (ref 101–111)
CO2: 24 mmol/L (ref 22–32)
CREATININE: 1.35 mg/dL — AB (ref 0.44–1.00)
GFR, EST AFRICAN AMERICAN: 47 mL/min — AB (ref >60)
GFR, EST NON AFRICAN AMERICAN: 41 mL/min — AB (ref >60)
Glucose, Bld: 314 mg/dL — ABNORMAL HIGH (ref 65–99)
Potassium: 3.8 mmol/L (ref 3.5–5.1)
Sodium: 133 mmol/L — ABNORMAL LOW (ref 135–145)
Total Bilirubin: 0.5 mg/dL (ref 0.3–1.2)
Total Protein: 8.1 g/dL (ref 6.5–8.1)

## 2015-08-05 LAB — CBC WITH DIFFERENTIAL/PLATELET
BASOS PCT: 1 %
Basophils Absolute: 0.1 10*3/uL (ref 0.0–0.1)
EOS ABS: 1.7 10*3/uL — AB (ref 0.0–0.7)
Eosinophils Relative: 15 %
HCT: 33.1 % — ABNORMAL LOW (ref 36.0–46.0)
Hemoglobin: 10.3 g/dL — ABNORMAL LOW (ref 12.0–15.0)
Lymphocytes Relative: 33 %
Lymphs Abs: 3.9 10*3/uL (ref 0.7–4.0)
MCH: 24.9 pg — ABNORMAL LOW (ref 26.0–34.0)
MCHC: 31.1 g/dL (ref 30.0–36.0)
MCV: 80.1 fL (ref 78.0–100.0)
MONO ABS: 0.9 10*3/uL (ref 0.1–1.0)
MONOS PCT: 8 %
Neutro Abs: 5.3 10*3/uL (ref 1.7–7.7)
Neutrophils Relative %: 43 %
Platelets: 271 10*3/uL (ref 150–400)
RBC: 4.13 MIL/uL (ref 3.87–5.11)
RDW: 13.6 % (ref 11.5–15.5)
WBC: 11.9 10*3/uL — ABNORMAL HIGH (ref 4.0–10.5)

## 2015-08-05 LAB — IRON AND TIBC
IRON: 93 ug/dL (ref 28–170)
Saturation Ratios: 34 % — ABNORMAL HIGH (ref 10.4–31.8)
TIBC: 276 ug/dL (ref 250–450)
UIBC: 183 ug/dL

## 2015-08-05 LAB — FERRITIN: FERRITIN: 73 ng/mL (ref 11–307)

## 2015-08-05 NOTE — Progress Notes (Signed)
Marland Kitchen    HEMATOLOGY/ONCOLOGY CONSULTATION NOTE  Date of Service: 08/05/2015  Patient Care Team: [name], MD as PCP - General (Nurse Practitioner) Herminio Commons, MD as Attending Physician (Cardiology) Daneil Dolin, MD as Consulting Physician (Gastroenterology) Dr Lowanda Foster MD (nephrology] PCP: Kevan Ny MD Oh  CHIEF COMPLAINTS/PURPOSE OF CONSULTATION:  M spike on peripheral blood and nephrotic range proteinuria Anemia Bilateral DVT  HISTORY OF PRESENTING ILLNESS:   Stephanie [name] is a wonderful 64 y.o. female who has been referred to [place] by Dr Lowanda Foster for evaluation and management of M spike in peripheral blood associated with nephrotic range proteinuria to rule out a plasma cell dyscrasia.  Patient has a history of hypertension, diabetes, fibromyalgia, hypothyroidism (on thyroid replacement), COPD ex-smoker quit in December 2016, irritable bowel syndrome with had issues with increasing left lower extremity swelling for about a year on and off. She notes that she was previously on Lasix and was switched to  Torsemide and metolazone with good diuresis and some improvement in her edema.  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 She was started on Eliquis for her DVTs.  No previous history of blood clots despite having 5 pregnancies.  He has had 3 miscarriages in the past 2 of them under a month and one at 2 months.etiology was unclear.   She had a sinusitis a few months back and had received antibiotics.  She was noted to have C. Difficile colitis and was treated for this during hospitalization and on discharge.has baseline irritable bowel syndrome. Previously has had a colonoscopy about 17 years ago when she was noted to have polyps.  EGD  in 1991 noted to have a hiatal hernia.  She was noted to have a chronic rash which has been present on and off since 1989.  As per outside records lupus testing was apparently negative and the possibility of some kind of vasculitis was entertained.  Patient was apparently on steroid for 8-10 years after which she was noted to have diabetes.  The rash tends to fluctuate.  She has been seen by a dermatologist but no skin biopsy has been done and she has been treated empirically.  Mild acute kidney injury during this hospitalization as well. 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 Apparently noted to have an M protein of 0.4 g/dL.ANCA testing was negative. Normal C3-C4.  Cryoglobulins negative.  Hepatitis C negative.    Patient notes no issues with bleeding at this time.  It lower extremity edema is better with adjustment of diuretics and anticoagulation for her bilateral lower extremity DVT. No significant diarrhea.  Notes about a 55 been pound weight loss over the last several months but this could be due to significant diuresis.   MEDICAL HISTORY:  Past Medical History  Diagnosis Date  . Diabetes mellitus   . Hypertension   . Fibromyalgia   . Asthma   . PONV (postoperative nausea and vomiting)   . Hyperthyroidism   . Anemia   . H/O hiatal hernia   . GERD (gastroesophageal reflux disease)   . COPD (chronic obstructive pulmonary disease) ([place])   . Inner ear disease   . IBS (irritable bowel syndrome)   . Tachycardia  SURGICAL HISTORY: Past Surgical History  Procedure Laterality Date  . Cholecystectomy    . Abdominal hysterectomy  partial  . Carpal tunnel release Right 1991  . Wrist ganglion excision Left   . Cataract extraction w/phaco Right 05/08/2013    Procedure: CATARACT EXTRACTION PHACO AND INTRAOCULAR LENS PLACEMENT (IOC);  Surgeon: Tonny Branch, MD;  Location: [facility];  Service: Ophthalmology;  Laterality: Right;   CDE 10.31  . Cataract extraction w/phaco Left 08/17/2013    Procedure: CATARACT EXTRACTION PHACO AND INTRAOCULAR LENS PLACEMENT (IOC);  Surgeon: Tonny Branch, MD;  Location: [facility];  Service: Ophthalmology;  Laterality: Left;  CDE:9.03  . Dental surgery      SOCIAL HISTORY: Social History   Social History  . Marital Status: Legally Separated    Spouse Name: N/A  . Number of Children: N/A  . Years of Education: N/A   Occupational History  . Not on file.   Social History Main Topics  . Smoking status: Former Smoker -- 0.25 packs/day for 30 years    Types: Cigarettes    Quit date: 02/18/2015  . Smokeless tobacco: Never Used     Comment: some day smoker  . Alcohol Use: No  . Drug Use: No  . Sexual Activity: Yes    Birth Control/ Protection: Surgical   Other Topics Concern  . Not on file   Social History Narrative    FAMILY HISTORY: Family History  Problem Relation Age of Onset  . Colon cancer Neg Hx     ALLERGIES:  is allergic to tetracyclines & related; banana; and penicillins.  MEDICATIONS:  Current Outpatient Prescriptions  Medication Sig Dispense Refill  . albuterol (PROVENTIL HFA;VENTOLIN HFA) 108 (90 BASE) MCG/ACT inhaler Inhale 2 puffs into the lungs every 6 (six) hours as needed for wheezing or shortness of breath.    Marland Kitchen albuterol (PROVENTIL) (2.5 MG/3ML) 0.083% nebulizer solution Take 2.5 mg by nebulization every 6 (six) hours as needed for wheezing or shortness of breath.    Marland Kitchen apixaban (ELIQUIS) 5 MG TABS tablet Take 10 mg by mouth BID through Friday 5/19 evening. Take 61m by mouth BID starting 5/20 AM. 60 tablet 3  . Bilberry, Vaccinium myrtillus, 100 MG CAPS Take 1 capsule by mouth 2 (two) times daily.    . cetirizine (ZYRTEC) 10 MG tablet Take 10 mg by mouth daily.    . Cholecalciferol (VITAMIN D) 1000 UNITS capsule Take 1,000 Units by mouth 2 (two) times daily.     . clobetasol ointment (TEMOVATE) 06.81% Apply 1 application topically 2 (two) times daily.       . Cyanocobalamin (VITAMIN B-12 PO) Take 1 tablet by mouth daily.      . DULoxetine (CYMBALTA) 30 MG capsule Take 30 mg by mouth 2 (two) times daily.     .Marland KitchenHYDROcodone-acetaminophen (NORCO/VICODIN) 5-325 MG tablet Take 1 tablet by mouth every 4 (four) hours as needed. 15 tablet 0  . hydrocortisone (ANUSOL-HC) 2.5 % rectal cream Place 1 application rectally 2 (two) times daily. 30 g 1  . hydrOXYzine (ATARAX/VISTARIL) 25 MG tablet Take 25 mg by mouth every 8 (eight) hours as needed.     . JENTADUETO 2.5-500 MG TABS Take 1 tablet by mouth 2 (two) times daily.     .Marland Kitchenlevothyroxine (SYNTHROID, LEVOTHROID) 100 MCG tablet Take 100 mcg by mouth daily before breakfast.   0  . Melatonin 5 MG CAPS Take 2 capsules by mouth at bedtime.    . metolazone (ZAROXOLYN) 5 MG tablet  Take 1 tablet (5 mg total) by mouth 2 (two) times daily. 60 tablet 0  . metoprolol (LOPRESSOR) 100 MG tablet TAKE 1 TABLET BY MOUTH TWICE DAILY **TAKE WITH 25MG TABLET FOR TOTAL DOSAGE OF 125MG** 180 tablet 1  . mupirocin ointment (BACTROBAN) 2 % Apply 1 application topically 2 (two) times daily.     . pantoprazole (PROTONIX) 40 MG tablet Take 40 mg by mouth daily.     . potassium chloride SA (K-DUR,KLOR-CON) 20 MEQ tablet Take 1 tablet (20 mEq total) by mouth daily. 30 tablet 0  . pravastatin (PRAVACHOL) 40 MG tablet Take 40 mg by mouth daily.     Marland Kitchen PULMICORT FLEXHALER 180 MCG/ACT inhaler Inhale 2 puffs into the lungs daily as needed (for shortness of breath-wheezing).     . torsemide (DEMADEX) 20 MG tablet Take 2 tablets (40 mg total) by mouth daily. 60 tablet 0  . traMADol (ULTRAM) 50 MG tablet Take 1 tablet (50 mg total) by mouth every 6 (six) hours as needed. 16 tablet 0  . vitamin C (ASCORBIC ACID) 500 MG tablet Take 1,000 mg by mouth daily.    . vitamin E 400 UNIT capsule Take 400 Units by mouth daily.     No current facility-administered medications for this visit.    REVIEW OF SYSTEMS:    10 Point review of Systems was done  is negative except as noted above.  PHYSICAL EXAMINATION: ECOG PERFORMANCE STATUS: 2 - Symptomatic, <50% confined to bed  . Filed Vitals:   08/05/15 0901  BP: 142/71  Pulse: 78  Temp: 98 F (36.7 C)  Resp: 16   Filed Weights   08/05/15 0901  Weight: 198 lb 9.6 oz (90.084 kg)   .Body mass index is 30.2 kg/(m^2).  GENERAL:alert, in no acute distress and comfortable SKIN: no acute rash is noted at this time EYES: normal, conjunctiva are pink and non-injected, sclera clear OROPHARYNX:no exudate, no erythema and lips, buccal mucosa, and tongue normal  NECK: supple, no JVD, thyroid normal size, non-tender, without nodularity LYMPH:  no palpable lymphadenopathy in the cervical, axillary or inguinal LUNGS: clear to auscultation with normal respiratory effort HEART: regular rate & rhythm,  no murmurs and bilateral 2+ pitting pedal edema ABDOMEN: abdomen soft, non-tender, normoactive bowel sounds , no palpable hepatosplenomegaly Musculoskeletal: no cyanosis of digits and no clubbing  PSYCH: alert & oriented x 3 with fluent speech NEURO: no focal motor/sensory deficits  LABORATORY DATA:  I have reviewed the data as listed  . CBC Latest Ref Rng 08/05/2015 07/22/2015 07/02/2015  WBC 4.0 - 10.5 K/uL 11.9(H) - 9.9  Hemoglobin 12.0 - 15.0 g/dL 10.3(L) 10.2(L) 9.3(L)  Hematocrit 36.0 - 46.0 % 33.1(L) 32.7(L) 28.7(L)  Platelets 150 - 400 K/uL 271 - 268   . CBC    Component Value Date/Time   WBC 11.9* 08/05/2015 1003   RBC 4.13 08/05/2015 1003   HGB 10.3* 08/05/2015 1003   HCT 33.1* 08/05/2015 1003   PLT 271 08/05/2015 1003   MCV 80.1 08/05/2015 1003   MCH 24.9* 08/05/2015 1003   MCHC 31.1 08/05/2015 1003   RDW 13.6 08/05/2015 1003   LYMPHSABS 3.9 08/05/2015 1003   MONOABS 0.9 08/05/2015 1003   EOSABS 1.7* 08/05/2015 1003   BASOSABS 0.1 08/05/2015 1003       Component     Latest Ref Rng 06/28/2015 08/05/2015  IgG (Immunoglobin G), Serum     700 - 1600 mg/dL  2754 (H)   IgA  87 - 352 mg/dL 743 (H) 546 (H)  IgM, Serum     26 - 217 mg/dL  49  Total Protein ELP     6.0 - 8.5 g/dL  7.9  Albumin SerPl Elph-Mcnc     2.9 - 4.4 g/dL  2.4 (L)  Alpha 1     0.0 - 0.4 g/dL  0.2  Alpha2 Glob SerPl Elph-Mcnc     0.4 - 1.0 g/dL  1.3 (H)  B-Globulin SerPl Elph-Mcnc     0.7 - 1.3 g/dL  1.7 (H)  Gamma Glob SerPl Elph-Mcnc     0.4 - 1.8 g/dL  2.3 (H)  M Protein SerPl Elph-Mcnc     Not Observed g/dL  0.9 (H)  Globulin, Total     2.2 - 3.9 g/dL  5.5 (H)  Albumin/Glob SerPl     0.7 - 1.7  0.5 (L)  IFE 1       Comment  Please Note (HCV):       Comment  Hepatitis B Surface Ag     Negative Negative   HCV Ab     0.0 - 0.9 s/co ratio <0.1   Hep A Ab, IgM     Negative Negative   Hep B Core Ab, IgM     Negative Negative   Iron     28 - 170 ug/dL  93  TIBC     250 - 450 ug/dL  276  Saturation Ratios     10.4 - 31.8 %  34 (H)  UIBC       183  Anticardiolipin Ab,IgG,Qn     0 - 14 GPL U/mL <9   Anticardiolipin Ab,IgM,Qn     0 - 12 MPL U/mL <9   Anticardiolipin Ab,IgA,Qn     0 - 11 APL U/mL <9   PTT Lupus Anticoagulant     0.0 - 43.6 sec  33.9  DRVVT     0.0 - 47.0 sec  44.5  Lupus Anticoag Interp       Comment:  Kappa free light chain     3.3 - 19.4 mg/L  312.0 (H)  Lamda free light chains     5.7 - 26.3 mg/L  54.6 (H)  Kappa, lamda light chain ratio     0.26 - 1.65  5.71 (H)  Myeloperoxidase Abs     0.0 - 9.0 U/mL <9.0   ANCA Proteinase 3     0.0 - 3.5 U/mL <3.5   ds DNA Ab     0 - 9 IU/mL <1   ANA Ab, IFA      Negative   ASO     0.0 - 200.0 IU/mL 74.0   C3 Complement     82 - 167 mg/dL 169 (H)   Complement C4, Body Fluid     14 - 44 mg/dL 47 (H)   Cryoglobulin     None detected Comment   GBM Ab     0 - 20 units 4   HIV     Non Reactive Non Reactive   Sed Rate     0 - 22 mm/hr 17   Ferritin     11 - 307 ng/mL  73   Component     Latest Ref Rng 08/05/2015  Iron     28 - 170 ug/dL 93  TIBC     250 - 450 ug/dL 276   Saturation Ratios     10.4 - 31.8 % 34 (H)  UIBC  183  PTT Lupus Anticoagulant     0.0 - 43.6 sec 33.9  DRVVT     0.0 - 47.0 sec 44.5  Lupus Anticoag Interp      Comment:  Beta-2 Glycoprotein I Ab, IgG     0 - 20 GPI IgG units <9  Beta-2-Glycoprotein I IgM     0 - 32 GPI IgM units <9  Beta-2-Glycoprotein I IgA     0 - 25 GPI IgA units <9  Ferritin     11 - 307 ng/mL 73   Lupus anticoagulant panel      The value has a corrected status.      No reference range information available      Comments:           (NOTE)           No lupus anticoagulant was detected.           Performed At: [facility]           [place], [place] [id number]           Lindon Romp MD SF:[id number]  Cryoglobulin      Reference Range: None detected      Comments:           (NOTE)           None Detected at 72 hours    Multiple Myeloma Panel (SPEP&IFE w/QIG)      The value has a corrected status.      No reference range information available      Comments:           (NOTE)           Immunofixation shows IgG monoclonal protein with kappa light           chain           specificity.           An apparent polyclonal gammopathy: IgG and IgA. Kappa and            lambda           typing appear increased.   . CMP Latest Ref Rng 08/05/2015 07/22/2015 07/02/2015  Glucose 65 - 99 mg/dL 314(H) 191(H) 205(H)  BUN 6 - 20 mg/dL 36(H) 33(H) 14  Creatinine 0.44 - 1.00 mg/dL 1.35(H) 1.43(H) 0.91  Sodium 135 - 145 mmol/L 133(L) 134(L) 135  Potassium 3.5 - 5.1 mmol/L 3.8 4.7 3.8  Chloride 101 - 111 mmol/L 102 103 109  CO2 22 - 32 mmol/L 24 26 23   Calcium 8.9 - 10.3 mg/dL 8.9 8.5(L) 7.6(L)  Total Protein 6.5 - 8.1 g/dL 8.1 - -  Total Bilirubin 0.3 - 1.2 mg/dL 0.5 - -  Alkaline Phos 38 - 126 U/L 293(H) - -  AST 15 - 41 U/L 29 - -  ALT 14 - 54 U/L 37 - -    Bilateral venous duplex ultrasound 06/27/2015 IMPRESSION: Bilateral posterior tibial vein  thrombus.   Electronically Signed  By: Inez Catalina M.D.  On: 06/27/2015 15:15   RADIOGRAPHIC STUDIES: I have personally reviewed the radiological images as listed and agreed with the findings in the report. No results found.  ASSESSMENT & PLAN:   65 year old African American female with multiple chronic medical comorbidities including hypertension, diabetes, COPD, fibromyalgia, thyroid problems with  #1 Nephrotic syndrome - unclear etiology.  Patient has about 9 g per 24 hours proteinuria.  This could be related to diabetes which  appears to be rather uncontrolled.  However in the presence of IgG kappa monoclonal gammopathy with increased kappa free light chains would need to rule out a plasma cell dyscrasia/AL Amyloidosis. Workup for other etiologies of nephrotic syndrome including HIV, hepatitis B, hepatitis C, cryoglobulins,ANCA antibodies and C3/C4 complements have been unrevealing.  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  Significant elevated Of free light chains with increased kappa lambda ratio.  Elevated alkaline phosphatase.  Normal sedimentation rate. Concerning for possible plasma cell dyscrasia/AL AMyloidosis.  #3 normocytic anemia -likely multifactorial.  No overt bleeding.  Her ferritin is in the 70s.  Rule out plasma cell dyscrasia.  Could be from chronic kidney disease.  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 On Eliquis.  In addition to multiple other acquired risk factors including relative immobility, chronic pedal edema, multiple medical comorbidities her newly noted nephrotic syndrome is a large risk factor. Antiphospholipid antibody panel has been negative.  PLAN -We'll work her up for a plasma cell dyscrasia.  24 hour UPEP with total protein to check for urinary M spike. -Whole-body skeletal survey -Multiple myeloma panel and serum kappa lambda free light chains done and reviewed as noted above. -On review of her initial lab work  the patient will likely need a bone marrow examination to rule out plasma cell dyscrasia and also stain for Amyloidosis. -Patient would need to be on an uninterrupted anticoagulation for about 6 months or potentially longer based on final results of workup. -Ongoing anticoagulation at this time and the possibility of paraproteinemia related coagulopathy might make it risky to get a renal biopsy at this time. -If the bone marrow biopsy shows no overt plasma cell dyscrasia or AL amyloidosis might need to consider a fat pad biopsy.  Labs today.  Whole-body skeletal survey in a week. Return to care with Dr. Whitney Muse in 4 weeks. Based on initial workup results will inform Dr. Whitney Muse regarding my recommendations to consider a bone marrow examination.  All of the patients questions were answered with apparent satisfaction. The patient knows to call the clinic with any problems, questions or concerns.  I spent 95mnutes counseling the patient face to face. The total time spent in the appointment was 80 minutes and more than 50% was on counseling and direct patient cares.    GSullivan LoneMD MTrout ValleyAAHIVMS SOrthony Surgical SuitesCJennie Stuart Medical CenterHematology/Oncology Physician CLarue D [facility] (Office):       [phone](Work cell):  [phone](Fax):           [phone] 08/05/2015 8:48 AM

## 2015-08-05 NOTE — Progress Notes (Signed)
CC'D TO PCP °

## 2015-08-05 NOTE — Patient Instructions (Signed)
[place] at [address] [facility] Discharge Instructions  RECOMMENDATIONS MADE BY THE CONSULTANT AND ANY TEST RESULTS WILL BE SENT TO YOUR REFERRING PHYSICIAN.  Labs Today Bone Survey in 1 week Return to see Dr. Whitney Muse in 4 weeks with labs Call the Clinic if you have any concerns.  Thank you for choosing [place] at [facility] to provide your oncology and hematology care.  To afford each patient quality time with our provider, please arrive at least 15 minutes before your scheduled appointment time.   Beginning January 23rd 2017 lab work for the [facility] will be done in the  Main lab at [facility] on 1st floor. If you have a lab appointment with the [facility] please come in thru the  Main Entrance and check in at the main information desk  You need to re-schedule your appointment should you arrive 10 or more minutes late.  We strive to give you quality time with our providers, and arriving late affects you and other patients whose appointments are after yours.  Also, if you no show three or more times for appointments you may be dismissed from the clinic at the providers discretion.     Again, thank you for choosing [facility].  Our hope is that these requests will decrease the amount of time that you wait before being seen by our physicians.       _____________________________________________________________  Should you have questions after your visit to [facility], please contact our office at [phone] between the hours of 8:30 a.m. and 4:30 p.m.  Voicemails left after 4:30 p.m. will not be returned until the following business day.  For prescription refill requests, have your pharmacy contact our office.         Resources For Cancer Patients and their Caregivers ? American Cancer Society: Can assist with transportation, wigs, general needs, runs Look Good Feel Better.        807-045-9722 ? Cancer  Care: Provides financial assistance, online support groups, medication/co-pay assistance.  1-800-813-HOPE (719)887-1908) ? [place] Assists [place] cancer patients and their families through emotional , educational and financial support.  239-077-9950 ? [place] DSS Where to apply for food stamps, Medicaid and utility assistance. (770)533-7023 ? [facility]: Transportation to medical appointments. (859)237-8641 ? Social Security Administration: May apply for disability if have a Stage IV cancer. (618)716-4680 808-158-1547 ? [employer], Disability and Transit Services: Assists with nutrition, care and transit needs. [place] Support Programs: @10RELATIVEDAYS @ > Cancer Support Group  2nd Tuesday of the month 1pm-2pm, Journey Room  > Creative Journey  3rd Tuesday of the month 1130am-1pm, Journey Room  > Look Good Feel Better  1st Wednesday of the month 10am-12 noon, Journey Room (Call [place] to register [phone])

## 2015-08-06 LAB — MULTIPLE MYELOMA PANEL, SERUM
ALBUMIN SERPL ELPH-MCNC: 2.4 g/dL — AB (ref 2.9–4.4)
Albumin/Glob SerPl: 0.5 — ABNORMAL LOW (ref 0.7–1.7)
Alpha 1: 0.2 g/dL (ref 0.0–0.4)
Alpha2 Glob SerPl Elph-Mcnc: 1.3 g/dL — ABNORMAL HIGH (ref 0.4–1.0)
B-GLOBULIN SERPL ELPH-MCNC: 1.7 g/dL — AB (ref 0.7–1.3)
Gamma Glob SerPl Elph-Mcnc: 2.3 g/dL — ABNORMAL HIGH (ref 0.4–1.8)
Globulin, Total: 5.5 g/dL — ABNORMAL HIGH (ref 2.2–3.9)
IGG (IMMUNOGLOBIN G), SERUM: 2754 mg/dL — AB (ref 700–1600)
IGM, SERUM: 49 mg/dL (ref 26–217)
IgA: 546 mg/dL — ABNORMAL HIGH (ref 87–352)
M PROTEIN SERPL ELPH-MCNC: 0.9 g/dL — AB
TOTAL PROTEIN ELP: 7.9 g/dL (ref 6.0–8.5)

## 2015-08-06 LAB — BETA-2-GLYCOPROTEIN I ABS, IGG/M/A
Beta-2 Glyco I IgG: <9 GPI IgG units (ref 0–20)
Beta-2-Glycoprotein I IgA: <9 GPI IgA units (ref 0–25)
Beta-2-Glycoprotein I IgM: <9 GPI IgM units (ref 0–32)

## 2015-08-06 LAB — KAPPA/LAMBDA LIGHT CHAINS
KAPPA, LAMDA LIGHT CHAIN RATIO: 5.71 — AB (ref 0.26–1.65)
Kappa free light chain: 312 mg/L — ABNORMAL HIGH (ref 3.3–19.4)
LAMDA FREE LIGHT CHAINS: 54.6 mg/L — AB (ref 5.7–26.3)

## 2015-08-07 LAB — LUPUS ANTICOAGULANT PANEL
DRVVT: 44.5 s (ref 0.0–47.0)
PTT LA: 33.9 s (ref 0.0–43.6)

## 2015-08-12 ENCOUNTER — Ambulatory Visit (HOSPITAL_COMMUNITY)
Admission: RE | Admit: 2015-08-12 | Discharge: 2015-08-12 | Disposition: A | Discharge status: Home / Self Care | Payer: Medicare Other | Source: Ambulatory Visit | Attending: Hematology | Admitting: Hematology

## 2015-08-12 DIAGNOSIS — I82403 Acute embolism and thrombosis of unspecified deep veins of lower extremity, bilateral: Secondary | ICD-10-CM | POA: Diagnosis not present

## 2015-08-12 DIAGNOSIS — J449 Chronic obstructive pulmonary disease, unspecified: Secondary | ICD-10-CM | POA: Diagnosis not present

## 2015-08-12 DIAGNOSIS — D649 Anemia, unspecified: Secondary | ICD-10-CM | POA: Diagnosis not present

## 2015-08-12 DIAGNOSIS — E119 Type 2 diabetes mellitus without complications: Secondary | ICD-10-CM | POA: Diagnosis not present

## 2015-08-12 DIAGNOSIS — D472 Monoclonal gammopathy: Secondary | ICD-10-CM | POA: Diagnosis not present

## 2015-08-12 DIAGNOSIS — Z87891 Personal history of nicotine dependence: Secondary | ICD-10-CM | POA: Diagnosis not present

## 2015-08-12 DIAGNOSIS — N049 Nephrotic syndrome with unspecified morphologic changes: Secondary | ICD-10-CM | POA: Diagnosis not present

## 2015-08-12 DIAGNOSIS — C9000 Multiple myeloma not having achieved remission: Secondary | ICD-10-CM | POA: Diagnosis not present

## 2015-08-14 LAB — UPEP/TP, 24-HR URINE
ALBUMIN, U: 71.5 %
ALPHA 1 UR: 1.2 %
ALPHA 2, URINE: 4.1 %
Beta, Urine: 13.1 %
Gamma Globulin, Urine: 10.3 %
M-Spike, mg/24 hr: 53.9 mg/24 hr — ABNORMAL HIGH
M-spike, %: 3.5 % — ABNORMAL HIGH
TOTAL PROTEIN, URINE-UR/DAY: 1540 mg/(24.h) — AB (ref 30–150)
TOTAL VOLUME: 2000
Total Protein, Urine: 77 mg/dL

## 2015-08-14 NOTE — Telephone Encounter (Signed)
Pt just had iron, tibc and ferritin completed at the hospital. Per AS ok to just do AMA and GGT. These have been mailed to the pt.

## 2015-08-21 ENCOUNTER — Other Ambulatory Visit (HOSPITAL_COMMUNITY): Payer: Self-pay | Admitting: *Deleted

## 2015-08-21 DIAGNOSIS — D472 Monoclonal gammopathy: Secondary | ICD-10-CM

## 2015-08-26 ENCOUNTER — Ambulatory Visit (HOSPITAL_COMMUNITY): Payer: Medicare Other

## 2015-08-29 ENCOUNTER — Other Ambulatory Visit: Payer: Self-pay | Admitting: Radiology

## 2015-09-02 ENCOUNTER — Other Ambulatory Visit: Payer: Self-pay | Admitting: General Surgery

## 2015-09-02 ENCOUNTER — Ambulatory Visit (HOSPITAL_COMMUNITY): Payer: Medicare Other | Admitting: Hematology & Oncology

## 2015-09-02 ENCOUNTER — Other Ambulatory Visit (HOSPITAL_COMMUNITY): Payer: Medicare Other

## 2015-09-03 ENCOUNTER — Ambulatory Visit (HOSPITAL_COMMUNITY): Payer: Medicare Other

## 2015-09-03 ENCOUNTER — Ambulatory Visit (HOSPITAL_COMMUNITY)
Admission: RE | Admit: 2015-09-03 | Discharge: 2015-09-03 | Disposition: A | Discharge status: Home / Self Care | Payer: Medicare Other | Source: Ambulatory Visit | Attending: Oncology | Admitting: Oncology

## 2015-09-03 ENCOUNTER — Encounter (HOSPITAL_COMMUNITY): Payer: Self-pay

## 2015-09-03 DIAGNOSIS — D721 Eosinophilia: Secondary | ICD-10-CM | POA: Insufficient documentation

## 2015-09-03 DIAGNOSIS — D72822 Plasmacytosis: Secondary | ICD-10-CM | POA: Diagnosis not present

## 2015-09-03 DIAGNOSIS — K219 Gastro-esophageal reflux disease without esophagitis: Secondary | ICD-10-CM | POA: Insufficient documentation

## 2015-09-03 DIAGNOSIS — D649 Anemia, unspecified: Secondary | ICD-10-CM | POA: Insufficient documentation

## 2015-09-03 DIAGNOSIS — J449 Chronic obstructive pulmonary disease, unspecified: Secondary | ICD-10-CM | POA: Diagnosis not present

## 2015-09-03 DIAGNOSIS — E859 Amyloidosis, unspecified: Secondary | ICD-10-CM | POA: Diagnosis not present

## 2015-09-03 DIAGNOSIS — E039 Hypothyroidism, unspecified: Secondary | ICD-10-CM | POA: Diagnosis not present

## 2015-09-03 DIAGNOSIS — K589 Irritable bowel syndrome without diarrhea: Secondary | ICD-10-CM | POA: Insufficient documentation

## 2015-09-03 DIAGNOSIS — E0590 Thyrotoxicosis, unspecified without thyrotoxic crisis or storm: Secondary | ICD-10-CM | POA: Diagnosis not present

## 2015-09-03 DIAGNOSIS — K449 Diaphragmatic hernia without obstruction or gangrene: Secondary | ICD-10-CM | POA: Diagnosis not present

## 2015-09-03 DIAGNOSIS — D509 Iron deficiency anemia, unspecified: Secondary | ICD-10-CM | POA: Diagnosis not present

## 2015-09-03 DIAGNOSIS — M797 Fibromyalgia: Secondary | ICD-10-CM | POA: Diagnosis not present

## 2015-09-03 DIAGNOSIS — Z880 Allergy status to penicillin: Secondary | ICD-10-CM | POA: Diagnosis not present

## 2015-09-03 DIAGNOSIS — R000 Tachycardia, unspecified: Secondary | ICD-10-CM | POA: Diagnosis not present

## 2015-09-03 DIAGNOSIS — I10 Essential (primary) hypertension: Secondary | ICD-10-CM | POA: Diagnosis not present

## 2015-09-03 DIAGNOSIS — Z86718 Personal history of other venous thrombosis and embolism: Secondary | ICD-10-CM | POA: Diagnosis not present

## 2015-09-03 DIAGNOSIS — Z794 Long term (current) use of insulin: Secondary | ICD-10-CM | POA: Diagnosis not present

## 2015-09-03 DIAGNOSIS — E1165 Type 2 diabetes mellitus with hyperglycemia: Secondary | ICD-10-CM | POA: Insufficient documentation

## 2015-09-03 DIAGNOSIS — D472 Monoclonal gammopathy: Secondary | ICD-10-CM | POA: Diagnosis not present

## 2015-09-03 DIAGNOSIS — Z7901 Long term (current) use of anticoagulants: Secondary | ICD-10-CM | POA: Insufficient documentation

## 2015-09-03 DIAGNOSIS — Z87891 Personal history of nicotine dependence: Secondary | ICD-10-CM | POA: Insufficient documentation

## 2015-09-03 LAB — GLUCOSE, CAPILLARY
Glucose-Capillary: 390 mg/dL — ABNORMAL HIGH (ref 65–99)
Glucose-Capillary: 415 mg/dL — ABNORMAL HIGH (ref 65–99)
Glucose-Capillary: 309 mg/dL — ABNORMAL HIGH (ref 65–99)

## 2015-09-03 LAB — CBC
HCT: 31.4 % — ABNORMAL LOW (ref 36.0–46.0)
Hemoglobin: 10.4 g/dL — ABNORMAL LOW (ref 12.0–15.0)
MCH: 25.3 pg — ABNORMAL LOW (ref 26.0–34.0)
MCHC: 33.1 g/dL (ref 30.0–36.0)
MCV: 76.4 fL — ABNORMAL LOW (ref 78.0–100.0)
PLATELETS: 219 10*3/uL (ref 150–400)
RBC: 4.11 MIL/uL (ref 3.87–5.11)
RDW: 13.5 % (ref 11.5–15.5)
WBC: 10.3 10*3/uL (ref 4.0–10.5)

## 2015-09-03 LAB — PROTIME-INR
INR: 1.21 (ref 0.00–1.49)
Prothrombin Time: 15 seconds (ref 11.6–15.2)

## 2015-09-03 LAB — APTT: APTT: 28 s (ref 24–37)

## 2015-09-03 LAB — BONE MARROW EXAM

## 2015-09-03 MED ORDER — INSULIN ASPART 100 UNIT/ML ~~LOC~~ SOLN
0.0000 [IU] | Freq: Three times a day (TID) | SUBCUTANEOUS | Status: DC
  Filled 2015-09-03: qty 0.15

## 2015-09-03 MED ORDER — INSULIN ASPART 100 UNIT/ML ~~LOC~~ SOLN
15.0000 [IU] | Freq: Once | SUBCUTANEOUS | Status: AC
  Administered 2015-09-03: 15 [IU] via SUBCUTANEOUS
  Filled 2015-09-03: qty 1
  Filled 2015-09-03: qty 0.15

## 2015-09-03 MED ORDER — MIDAZOLAM HCL 2 MG/2ML IJ SOLN
INTRAMUSCULAR | Status: AC | PRN
  Administered 2015-09-03 (×2): 1 mg via INTRAVENOUS

## 2015-09-03 MED ORDER — FENTANYL CITRATE (PF) 100 MCG/2ML IJ SOLN
INTRAMUSCULAR | Status: AC | PRN
  Administered 2015-09-03: 50 ug via INTRAVENOUS

## 2015-09-03 MED ORDER — MIDAZOLAM HCL 2 MG/2ML IJ SOLN
INTRAMUSCULAR | Status: AC
  Filled 2015-09-03: qty 6

## 2015-09-03 MED ORDER — FENTANYL CITRATE (PF) 100 MCG/2ML IJ SOLN
INTRAMUSCULAR | Status: AC
  Filled 2015-09-03: qty 4

## 2015-09-03 MED ORDER — SODIUM CHLORIDE 0.9 % IV SOLN
INTRAVENOUS | Status: DC
  Administered 2015-09-03: 08:00:00 via INTRAVENOUS

## 2015-09-03 NOTE — Procedures (Signed)
Interventional Radiology Procedure Note  Procedure: CT guided aspirate and core biopsy of right iliac bone Complications: None Recommendations: - Bedrest supine x 1 hrs - Hydrocodone PRN  Pain - Follow biopsy results  Signed,  Criselda Peaches, MD

## 2015-09-03 NOTE — Progress Notes (Signed)
Notified Gareth Eagle, PA patient blood sugar 415 post procedure, patient given 1 time dose insulin as ordered.  Advised patient per PA to take morning dose of diabetes medication when she gets home.  Patient and family verbalize understanding

## 2015-09-03 NOTE — Progress Notes (Signed)
Pre procedure blood sugar 390, patient states "that's not too bad" , "its usually high 300's"  Diabetes education provided re: normal blood sugar goals, foods affective bllod sugar and long term complication re: uncontrolled blood sugar.

## 2015-09-03 NOTE — Discharge Instructions (Signed)
Bone Marrow Aspiration and Bone Marrow Biopsy °Bone marrow aspiration and bone marrow biopsy are procedures that are done to diagnose blood disorders. You may also have one of these procedures to help diagnose infections or some types of cancer. °Bone marrow is the soft tissue that is inside your bones. Blood cells are produced in bone marrow. For bone marrow aspiration, a sample of tissue in liquid form is removed from inside your bone. For a bone marrow biopsy, a small core of bone marrow tissue is removed. Then these samples are examined under a microscope or tested in a lab. °You may need these procedures if you have an abnormal complete blood count (CBC). The aspiration or biopsy sample is usually taken from the top of your hip bone. Sometimes, an aspiration sample is taken from your chest bone (sternum). °LET YOUR HEALTH CARE PROVIDER KNOW ABOUT: °· Any allergies you have. °· All medicines you are taking, including vitamins, herbs, eye drops, creams, and over-the-counter medicines. °· Previous problems you or members of your family have had with the use of anesthetics. °· Any blood disorders you have. °· Previous surgeries you have had. °· Any medical conditions you may have. °· Whether you are pregnant or you think that you may be pregnant. °RISKS AND COMPLICATIONS °Generally, this is a safe procedure. However, problems may occur, including: °· Infection. °· Bleeding. °BEFORE THE PROCEDURE °· Ask your health care provider about: °¨ Changing or stopping your regular medicines. This is especially important if you are taking diabetes medicines or blood thinners. °¨ Taking medicines such as aspirin and ibuprofen. These medicines can thin your blood. Do not take these medicines before your procedure if your health care provider instructs you not to. °· Plan to have someone take you home after the procedure. °· If you go home right after the procedure, plan to have someone with you for 24 hours. °PROCEDURE  °· An  IV tube may be inserted into one of your veins. °· The injection site will be cleaned with a germ-killing solution (antiseptic). °· You will be given one or more of the following: °¨ A medicine that helps you relax (sedative). °¨ A medicine that numbs the area (local anesthetic). °· The bone marrow sample will be removed as follows: °¨ For an aspiration, a hollow needle will be inserted through your skin and into your bone. Bone marrow fluid will be drawn up into a syringe. °¨ For a biopsy, your health care provider will use a hollow needle to remove a core of tissue from your bone marrow. °· The needle will be removed. °· A bandage (dressing) will be placed over the insertion site and taped in place. °The procedure may vary among health care providers and hospitals. °AFTER THE PROCEDURE °· Your blood pressure, heart rate, breathing rate, and blood oxygen level will be monitored often until the medicines you were given have worn off. °· Return to your normal activities as directed by your health care provider. °  °This information is not intended to replace advice given to you by your health care provider. Make sure you discuss any questions you have with your health care provider. °  °Document Released: 02/06/2004 Document Revised: 06/19/2014 Document Reviewed: 01/24/2014 °Elsevier Interactive Patient Education ©2016 [employer] ° °Bone Marrow Aspiration and Bone Marrow Biopsy, Care After °Refer to this sheet in the next few weeks. These instructions provide you with information about caring for yourself after your procedure. Your health care provider may also give   you more specific instructions. Your treatment has been planned according to current medical practices, but problems sometimes occur. Call your health care provider if you have any problems or questions after your procedure. °WHAT TO EXPECT AFTER THE PROCEDURE °After your procedure, it is common to have: °· Soreness or tenderness around the puncture  site. °· Bruising. °HOME CARE INSTRUCTIONS °· Take medicines only as directed by your health care provider. °· Follow your health care provider's instructions about: °¨ Puncture site care. °¨ Bandage (dressing) changes and removal. °· Bathe and shower as directed by your health care provider. °· Check your puncture site every day for signs of infection. Watch for: °¨ Redness, swelling, or pain. °¨ Fluid, blood, or pus. °· Return to your normal activities as directed by your health care provider. °· Keep all follow-up visits as directed by your health care provider. This is important. °SEEK MEDICAL CARE IF: °· You have a fever. °· You have uncontrollable bleeding. °· You have redness, swelling, or pain at the site of your puncture. °· You have fluid, blood, or pus coming from your puncture site. °  °This information is not intended to replace advice given to you by your health care provider. Make sure you discuss any questions you have with your health care provider. °  °Document Released: 08/22/2004 Document Revised: 06/19/2014 Document Reviewed: 01/24/2014 °Elsevier Interactive Patient Education ©2016 [employer] ° °Moderate Conscious Sedation, Adult, Care After °Refer to this sheet in the next few weeks. These instructions provide you with information on caring for yourself after your procedure. Your health care provider may also give you more specific instructions. Your treatment has been planned according to current medical practices, but problems sometimes occur. Call your health care provider if you have any problems or questions after your procedure. °WHAT TO EXPECT AFTER THE PROCEDURE  °After your procedure: °· You may feel sleepy, clumsy, and have poor balance for several hours. °· Vomiting may occur if you eat too soon after the procedure. °HOME CARE INSTRUCTIONS °· Do not participate in any activities where you could become injured for at least 24 hours. Do not: °¨ Drive. °¨ Swim. °¨ Ride a  bicycle. °¨ Operate heavy machinery. °¨ Cook. °¨ Use power tools. °¨ Climb ladders. °¨ Work from a high place. °· Do not make important decisions or sign legal documents until you are improved. °· If you vomit, drink water, juice, or soup when you can drink without vomiting. Make sure you have little or no nausea before eating solid foods. °· Only take over-the-counter or prescription medicines for pain, discomfort, or fever as directed by your health care provider. °· Make sure you and your family fully understand everything about the medicines given to you, including what side effects may occur. °· You should not drink alcohol, take sleeping pills, or take medicines that cause drowsiness for at least 24 hours. °· If you smoke, do not smoke without supervision. °· If you are feeling better, you may resume normal activities 24 hours after you were sedated. °· Keep all appointments with your health care provider. °SEEK MEDICAL CARE IF: °· Your skin is pale or bluish in color. °· You continue to feel nauseous or vomit. °· Your pain is getting worse and is not helped by medicine. °· You have bleeding or swelling. °· You are still sleepy or feeling clumsy after 24 hours. °SEEK IMMEDIATE MEDICAL CARE IF: °· You develop a rash. °· You have difficulty breathing. °· You develop any   type of allergic problem.  You have a fever. MAKE SURE YOU:  Understand these instructions.  Will watch your condition.  Will get help right away if you are not doing well or get worse.   This information is not intended to replace advice given to you by your health care provider. Make sure you discuss any questions you have with your health care provider.   Document Released: 11/23/2012 Document Revised: 02/23/2014 Document Reviewed: 11/23/2012 Elsevier Interactive Patient Education [employer].

## 2015-09-03 NOTE — H&P (Signed)
Chief Complaint: IgG kappa monoclonal gammopathy   Referring Physician(s): [facility]  Supervising Physician: Jacqulynn Cadet  Patient Status: Outpatient  History of Present Illness: Stephanie [name] is a 65 y.o. 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  She was referred to Dr. Irene Limbo by Dr Lowanda Foster for evaluation and management of M spike in peripheral blood associated with nephrotic range proteinuria to rule out a plasma cell dyscrasia.  She is here today for a  is here today for a bone marrow biopsy to rule out plasma cell dyscrasia and also stain for Amyloidosis.  She feels ok today.  She continues to take her Eliquis.  Her blood glucose this morning is >300. She reports she had not taken her diabetic medications due to her NPO status.   Past Medical History  Diagnosis Date  . Diabetes mellitus   . Hypertension   . Fibromyalgia   . Asthma   . PONV (postoperative nausea and vomiting)   . Hyperthyroidism   . Anemia   . H/O hiatal hernia   . GERD (gastroesophageal reflux disease)   . COPD (chronic obstructive pulmonary disease) ([place])   . Inner ear disease   . IBS (irritable bowel syndrome)   . Tachycardia     Past Surgical History  Procedure Laterality Date  . Cholecystectomy    . Abdominal hysterectomy  partial  . Carpal tunnel release Right 1991  . Wrist ganglion excision Left   . Cataract extraction w/phaco Right 05/08/2013    Procedure: CATARACT EXTRACTION PHACO AND INTRAOCULAR LENS PLACEMENT (IOC);  Surgeon: Tonny Branch, MD;  Location: [facility];  Service: Ophthalmology;  Laterality: Right;  CDE 10.31  . Cataract extraction w/phaco Left 08/17/2013    Procedure: CATARACT EXTRACTION PHACO AND INTRAOCULAR LENS PLACEMENT (IOC);  Surgeon: Tonny Branch, MD;  Location: [facility];  Service:  Ophthalmology;  Laterality: Left;  CDE:9.03  . Dental surgery      Allergies: Tetracyclines & related; Banana; and Penicillins  Medications: Prior to Admission medications   Medication Sig Start Date End Date Taking? Authorizing Provider  albuterol (PROVENTIL HFA;VENTOLIN HFA) 108 (90 BASE) MCG/ACT inhaler Inhale 2 puffs into the lungs every 6 (six) hours as needed for wheezing or shortness of breath.   Yes Historical Provider, MD  apixaban (ELIQUIS) 5 MG TABS tablet Take 10 mg by mouth BID through Friday 5/19 evening. Take 61m by mouth BID starting 5/20 AM. 07/25/15  Yes KLendon Colonel NP  [place], Vaccinium myrtillus, 100 MG CAPS Take 1 capsule by mouth 2 (two) times daily.   Yes Historical Provider, MD  cetirizine (ZYRTEC) 10 MG tablet Take 10 mg by mouth daily.   Yes Historical Provider, MD  Cholecalciferol (VITAMIN D) 1000 UNITS capsule Take 1,000 Units by mouth 2 (two) times daily.    Yes Historical Provider, MD  clobetasol ointment (TEMOVATE) 05.36% Apply 1 application topically 2 (two) times daily.  06/20/15  Yes Historical Provider, MD  Cyanocobalamin (VITAMIN B-12 PO) Take 1 tablet by mouth daily.     Yes Historical Provider, MD  DULoxetine (CYMBALTA) 30 MG capsule Take 30 mg by mouth 2 (two) times daily.  06/20/15  Yes Historical Provider, MD  hydrOXYzine (ATARAX/VISTARIL) 25 MG tablet Take 25 mg by mouth every 8 (eight) hours as needed.  06/20/15  Yes Historical Provider, MD  insulin glargine (LANTUS) 100 UNIT/ML injection Inject 30  Units into the skin at bedtime.   Yes Historical Provider, MD  JENTADUETO 2.5-500 MG TABS Take 1 tablet by mouth 2 (two) times daily.  06/21/15  Yes Historical Provider, MD  levothyroxine (SYNTHROID, LEVOTHROID) 100 MCG tablet Take 100 mcg by mouth daily before breakfast.  11/23/13  Yes Historical Provider, MD  LYRICA 75 MG capsule  07/18/15  Yes Historical Provider, MD  metolazone (ZAROXOLYN) 5 MG tablet Take 1 tablet (5 mg total) by mouth 2 (two) times  daily. 07/02/15  Yes Samuella Cota, MD  metoprolol (LOPRESSOR) 100 MG tablet TAKE 1 TABLET BY MOUTH TWICE DAILY **TAKE WITH 25MG TABLET FOR TOTAL DOSAGE OF 125MG** 06/25/15  Yes Lendon Colonel, NP  mupirocin ointment (BACTROBAN) 2 % Apply 1 application topically 2 (two) times daily.  06/20/15  Yes Historical Provider, MD  pantoprazole (PROTONIX) 40 MG tablet Take 40 mg by mouth daily.  06/21/15  Yes Historical Provider, MD  potassium chloride SA (K-DUR,KLOR-CON) 20 MEQ tablet Take 1 tablet (20 mEq total) by mouth daily. 07/02/15  Yes Samuella Cota, MD  pravastatin (PRAVACHOL) 40 MG tablet Take 40 mg by mouth daily.  06/20/15  Yes Historical Provider, MD  Doug Sou 180 MCG/ACT inhaler Inhale 2 puffs into the lungs daily as needed (for shortness of breath-wheezing).  06/20/15  Yes Historical Provider, MD  torsemide (DEMADEX) 20 MG tablet Take 2 tablets (40 mg total) by mouth daily. 07/02/15  Yes Samuella Cota, MD  traMADol (ULTRAM) 50 MG tablet Take 1 tablet (50 mg total) by mouth every 6 (six) hours as needed. 05/27/15  Yes [name], MD  vitamin C (ASCORBIC ACID) 500 MG tablet Take 1,000 mg by mouth daily.   Yes Historical Provider, MD  vitamin E 400 UNIT capsule Take 400 Units by mouth daily.   Yes Historical Provider, MD  albuterol (PROVENTIL) (2.5 MG/3ML) 0.083% nebulizer solution Take 2.5 mg by nebulization every 6 (six) hours as needed for wheezing or shortness of breath.    Historical Provider, MD  HYDROcodone-acetaminophen (NORCO/VICODIN) 5-325 MG tablet Take 1 tablet by mouth every 4 (four) hours as needed. 02/08/15   Lily Kocher, PA-C  hydrocortisone (ANUSOL-HC) 2.5 % rectal cream Place 1 application rectally 2 (two) times daily. 07/23/15   Orvil Feil, NP  Melatonin 5 MG CAPS Take 2 capsules by mouth at bedtime.    Historical Provider, MD     Family History  Problem Relation Age of Onset  . Colon cancer Neg Hx     Social History   Social History  . Marital Status:  Legally Separated    Spouse Name: N/A  . Number of Children: N/A  . Years of Education: N/A   Social History Main Topics  . Smoking status: Former Smoker -- 0.25 packs/day for 30 years    Types: Cigarettes    Quit date: 02/18/2015  . Smokeless tobacco: Never Used     Comment: some day smoker  . Alcohol Use: No  . Drug Use: No  . Sexual Activity: Yes    Birth Control/ Protection: Surgical   Other Topics Concern  . None   Social History Narrative     Review of Systems: A 12 point ROS discussed Review of Systems  Constitutional: Negative.   HENT: Negative.   Respiratory: Negative.   Cardiovascular: Negative.   Gastrointestinal: Negative.   Genitourinary: Negative.   Musculoskeletal: Negative.   Skin: Negative.   Neurological: Negative.   Hematological: Negative.   Psychiatric/Behavioral: Negative.  Vital Signs: BP 122/64 mmHg  Pulse 77  Temp(Src) 98 F (36.7 C) (Oral)  Resp 18  SpO2 100%  Physical Exam  Constitutional: She is oriented to person, place, and time. She appears well-developed and well-nourished.  HENT:  Head: Normocephalic and atraumatic.  Eyes: EOM are normal.  Neck: Neck supple.  Cardiovascular: Normal rate, regular rhythm and normal heart sounds.   No murmur heard. Pulmonary/Chest: Effort normal and breath sounds normal. She has no wheezes.  Abdominal: Soft. Bowel sounds are normal. There is no tenderness.  Musculoskeletal: Normal range of motion.  Neurological: She is alert and oriented to person, place, and time.  Skin: Skin is warm and dry.  Psychiatric: She has a normal mood and affect. Her behavior is normal. Judgment and thought content normal.  Vitals reviewed.   Mallampati Score:  MD Evaluation Airway: WNL Heart: WNL Abdomen: WNL Chest/ Lungs: WNL ASA  Classification: 2 Mallampati/Airway Score: One  Imaging: Dg Bone Survey Met  08/12/2015  CLINICAL DATA:  Staging myeloma EXAM: METASTATIC BONE SURVEY COMPARISON:  None.  FINDINGS: No lytic lesions are seen suggestive of myeloma. Negative for fracture.  No worrisome bone lesion. Degenerative changes throughout the spine most severe at L3-4 and L4-5. Grade 1 anterior slip L3-4 L4-5. Degenerative change in the shoulder and knees bilaterally. IMPRESSION: Negative for myeloma. Electronically Signed   By: Franchot Gallo M.D.   On: 08/12/2015 15:32    Labs:  CBC:  Recent Labs  07/01/15 0459 07/02/15 0440 07/22/15 1705 08/05/15 1003 09/03/15 0730  WBC 10.0 9.9  --  11.9* 10.3  HGB 9.6* 9.3* 10.2* 10.3* 10.4*  HCT 29.5* 28.7* 32.7* 33.1* 31.4*  PLT 357 268  --  271 219    COAGS:  Recent Labs  06/27/15 2020 09/03/15 0730  INR 1.01 1.21  APTT 30 28    BMP:  Recent Labs  07/01/15 0449 07/02/15 0440 07/22/15 1705 08/05/15 1001  NA 135 135 134* 133*  K 3.5 3.8 4.7 3.8  CL 109 109 103 102  CO2 23 23 26 24   GLUCOSE 87 205* 191* 314*  BUN 14 14 33* 36*  CALCIUM 7.5* 7.6* 8.5* 8.9  CREATININE 0.87 0.91 1.43* 1.35*  GFRNONAA >60 >60 38* 41*  GFRAA >60 >60 44* 47*    LIVER FUNCTION TESTS:  Recent Labs  06/27/15 2020  07/01/15 0449 07/02/15 0440 07/22/15 1705 08/05/15 1001  BILITOT 0.4  --   --   --   --  0.5  AST 21  --   --   --   --  29  ALT 20  --   --   --   --  37  ALKPHOS 356*  --   --   --   --  293*  PROT 6.6  --   --   --   --  8.1  ALBUMIN 1.1*  < > 1.0* 1.0* 1.8* 2.3*  < > = values in this interval not displayed.  TUMOR MARKERS: No results for input(s): AFPTM, CEA, CA199, CHROMGRNA in the last 8760 hours.  Assessment and Plan:  IgG kappa monoclonal gammopathy   Will proceed with bone marrow biopsy today to rule out plasma cell dyscrasia and also stain for Amyloidosis.  Risks and Benefits discussed with the patient including, but not limited to bleeding, infection, damage to adjacent structures or low yield requiring additional tests.  All of the patient's questions were answered, patient is agreeable to  proceed. Consent signed and in  chart.  Hyperglycemia - She did not take her usual diabetic medications this morning. Will add moderate SSI and monitor.  Thank you for this interesting consult.  I greatly enjoyed meeting Stephanie [name] and look forward to participating in their care.  A copy of this report was sent to the requesting provider on this date.  Electronically Signed: Murrell Redden PA-C 09/03/2015, 8:23 AM   I spent a total of  30 Minutes in face to face in clinical consultation, greater than 50% of which was counseling/coordinating care for image guided bone marrow biopsy.

## 2015-09-04 DIAGNOSIS — R600 Localized edema: Secondary | ICD-10-CM | POA: Diagnosis not present

## 2015-09-09 DIAGNOSIS — R809 Proteinuria, unspecified: Secondary | ICD-10-CM | POA: Diagnosis not present

## 2015-09-09 DIAGNOSIS — E559 Vitamin D deficiency, unspecified: Secondary | ICD-10-CM | POA: Diagnosis not present

## 2015-09-09 DIAGNOSIS — N183 Chronic kidney disease, stage 3 (moderate): Secondary | ICD-10-CM | POA: Diagnosis not present

## 2015-09-09 DIAGNOSIS — D509 Iron deficiency anemia, unspecified: Secondary | ICD-10-CM | POA: Diagnosis not present

## 2015-09-09 DIAGNOSIS — I10 Essential (primary) hypertension: Secondary | ICD-10-CM | POA: Diagnosis not present

## 2015-09-09 DIAGNOSIS — Z79899 Other long term (current) drug therapy: Secondary | ICD-10-CM | POA: Diagnosis not present

## 2015-09-10 LAB — CHROMOSOME ANALYSIS, BONE MARROW

## 2015-09-11 ENCOUNTER — Ambulatory Visit (HOSPITAL_COMMUNITY): Payer: Medicare Other | Admitting: Hematology & Oncology

## 2015-09-11 ENCOUNTER — Other Ambulatory Visit (HOSPITAL_COMMUNITY): Payer: Medicare Other

## 2015-09-11 DIAGNOSIS — E875 Hyperkalemia: Secondary | ICD-10-CM | POA: Diagnosis not present

## 2015-09-11 DIAGNOSIS — D638 Anemia in other chronic diseases classified elsewhere: Secondary | ICD-10-CM | POA: Diagnosis not present

## 2015-09-11 DIAGNOSIS — N183 Chronic kidney disease, stage 3 (moderate): Secondary | ICD-10-CM | POA: Diagnosis not present

## 2015-09-11 DIAGNOSIS — E559 Vitamin D deficiency, unspecified: Secondary | ICD-10-CM | POA: Diagnosis not present

## 2015-09-11 NOTE — Progress Notes (Signed)
Stephanie [name]    HEMATOLOGY/ONCOLOGY CONSULTATION NOTE  Date of Service: 09/11/2015  Patient Care Team: Stephanie Simmer, MD as PCP - General (Nurse Practitioner) Stephanie Commons, MD as Attending Physician (Cardiology) Stephanie Dolin, MD as Consulting Physician (Gastroenterology) Dr Stephanie Foster MD (nephrology] PCP: Stephanie Ny MD  CHIEF COMPLAINTS/PURPOSE OF CONSULTATION:  M spike on peripheral blood and nephrotic range proteinuria Anemia Bilateral DVT  HISTORY OF PRESENTING ILLNESS:   Stephanie [name] is a wonderful 65 y.o. female who has been referred to [place] by Dr Stephanie [name] for evaluation and management of M spike in peripheral blood associated with nephrotic range proteinuria to rule out a plasma cell dyscrasia.  Patient has a history of hypertension, diabetes, fibromyalgia, hypothyroidism (on thyroid replacement), COPD ex-smoker quit in December 2016, irritable bowel syndrome with had issues with increasing left lower extremity swelling for about a year on and off. She notes that she was previously on Lasix and was switched to  Torsemide and metolazone with good diuresis and some improvement in her edema.  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 She was started on Eliquis for her DVTs.  No previous history of blood clots despite having 5 pregnancies.  He has had 3 miscarriages in the past 2 of them under a month and one at 2 months.etiology was unclear.  Mild acute kidney injury during this hospitalization as well. 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 Apparently noted to have an M protein of 0.4 g/dL.ANCA testing was negative. Normal C3-C4.  Cryoglobulins negative.  Hepatitis C negative.    Patient  notes no issues with bleeding at this time.  It lower extremity edema is better with adjustment of diuretics and anticoagulation for her bilateral lower extremity DVT. No significant diarrhea.  Notes about a 55 been pound weight loss over the last several months but this could be due to significant diuresis.  Stephanie [name] is here to review BMBX and for ongoing recommendations.    MEDICAL HISTORY:  Past Medical History:  Diagnosis Date  . Anemia   . Asthma   . COPD (chronic obstructive pulmonary disease) ([place])   . Diabetes mellitus   . Fibromyalgia   . GERD (gastroesophageal reflux disease)   . H/O hiatal hernia   . Hypertension   . Hyperthyroidism   . IBS (irritable bowel syndrome)   . Inner ear disease   . PONV (postoperative nausea and vomiting)   . Tachycardia     SURGICAL HISTORY: Past Surgical History:  Procedure Laterality Date  . ABDOMINAL HYSTERECTOMY  partial  . CARPAL TUNNEL RELEASE Right 1991  . CATARACT EXTRACTION W/PHACO Right 05/08/2013   Procedure: CATARACT EXTRACTION PHACO AND INTRAOCULAR LENS PLACEMENT (IOC);  Surgeon: Tonny Branch, MD;  Location: [facility];  Service: Ophthalmology;  Laterality: Right;  CDE 10.31  . CATARACT EXTRACTION W/PHACO Left 08/17/2013   Procedure: CATARACT EXTRACTION PHACO AND INTRAOCULAR LENS PLACEMENT (IOC);  Surgeon: Tonny Branch, MD;  Location: [facility];  Service: Ophthalmology;  Laterality: Left;  CDE:9.03  . CHOLECYSTECTOMY    . DENTAL SURGERY    . WRIST GANGLION EXCISION Left     SOCIAL HISTORY: Social History   Social History  . Marital status: Legally Separated    Spouse  name: N/A  . Number of children: N/A  . Years of education: N/A   Occupational History  . Not on file.   Social History Main Topics  . Smoking status: Former Smoker    Packs/day: 0.25    Years: 30.00    Types: Cigarettes    Quit date: 02/18/2015  . Smokeless tobacco: Never Used     Comment: some day smoker  . Alcohol use No  . Drug use: No  . Sexual  activity: Yes    Birth control/ protection: Surgical   Other Topics Concern  . Not on file   Social History Narrative  . No narrative on file    FAMILY HISTORY: Family History  Problem Relation Age of Onset  . Colon cancer Neg Hx     ALLERGIES:  is allergic to tetracyclines & related; banana; and penicillins.  MEDICATIONS:  Current Outpatient Prescriptions  Medication Sig Dispense Refill  . albuterol (PROVENTIL HFA;VENTOLIN HFA) 108 (90 BASE) MCG/ACT inhaler Inhale 2 puffs into the lungs every 6 (six) hours as needed for wheezing or shortness of breath.    Stephanie [name] albuterol (PROVENTIL) (2.5 MG/3ML) 0.083% nebulizer solution Take 2.5 mg by nebulization every 6 (six) hours as needed for wheezing or shortness of breath.    Stephanie [name] apixaban (ELIQUIS) 5 MG TABS tablet Take 10 mg by mouth BID through Friday 5/19 evening. Take 96m by mouth BID starting 5/20 AM. 60 tablet 3  . Bilberry, Vaccinium myrtillus, 100 MG CAPS Take 1 capsule by mouth 2 (two) times daily.    . cetirizine (ZYRTEC) 10 MG tablet Take 10 mg by mouth daily.    . Cholecalciferol (VITAMIN D) 1000 UNITS capsule Take 1,000 Units by mouth 2 (two) times daily.     . clobetasol ointment (TEMOVATE) 01.74% Apply 1 application topically 2 (two) times daily.     . Cyanocobalamin (VITAMIN B-12 PO) Take 1 tablet by mouth daily.      . DULoxetine (CYMBALTA) 30 MG capsule Take 30 mg by mouth 2 (two) times daily.     .Stephanie KitchenHYDROcodone-acetaminophen (NORCO/VICODIN) 5-325 MG tablet Take 1 tablet by mouth every 4 (four) hours as needed. 15 tablet 0  . hydrocortisone (ANUSOL-HC) 2.5 % rectal cream Place 1 application rectally 2 (two) times daily. 30 g 1  . hydrOXYzine (ATARAX/VISTARIL) 25 MG tablet Take 25 mg by mouth every 8 (eight) hours as needed.     . insulin glargine (LANTUS) 100 UNIT/ML injection Inject 30 Units into the skin at bedtime.    . JENTADUETO 2.5-500 MG TABS Take 1 tablet by mouth 2 (two) times daily.     .Stephanie Kitchenlevothyroxine (SYNTHROID,  LEVOTHROID) 100 MCG tablet Take 100 mcg by mouth daily before breakfast.   0  . LYRICA 75 MG capsule     . Melatonin 5 MG CAPS Take 2 capsules by mouth at bedtime.    . metolazone (ZAROXOLYN) 5 MG tablet Take 1 tablet (5 mg total) by mouth 2 (two) times daily. 60 tablet 0  . metoprolol (LOPRESSOR) 100 MG tablet TAKE 1 TABLET BY MOUTH TWICE DAILY **TAKE WITH 25MG TABLET FOR TOTAL DOSAGE OF 125MG** 180 tablet 1  . mupirocin ointment (BACTROBAN) 2 % Apply 1 application topically 2 (two) times daily.     . pantoprazole (PROTONIX) 40 MG tablet Take 40 mg by mouth daily.     . potassium chloride SA (K-DUR,KLOR-CON) 20 MEQ tablet Take 1 tablet (20 mEq total) by mouth daily. 30 tablet 0  .  pravastatin (PRAVACHOL) 40 MG tablet Take 40 mg by mouth daily.     Stephanie [name] PULMICORT FLEXHALER 180 MCG/ACT inhaler Inhale 2 puffs into the lungs daily as needed (for shortness of breath-wheezing).     . torsemide (DEMADEX) 20 MG tablet Take 2 tablets (40 mg total) by mouth daily. 60 tablet 0  . traMADol (ULTRAM) 50 MG tablet Take 1 tablet (50 mg total) by mouth every 6 (six) hours as needed. 16 tablet 0  . vitamin C (ASCORBIC ACID) 500 MG tablet Take 1,000 mg by mouth daily.    . vitamin E 400 UNIT capsule Take 400 Units by mouth daily.     No current facility-administered medications for this visit.     REVIEW OF SYSTEMS:   14 point review of systems was performed and is negative except as detailed under history of present illness and above   PHYSICAL EXAMINATION: ECOG PERFORMANCE STATUS: 2 - Symptomatic, <50% confined to bed  BP (!) 103/47 (BP Location: Left Arm, Patient Position: Sitting)   Pulse 75   Temp 97.8 F (36.6 C) (Oral)   Resp 16   Wt 200 lb 1.6 oz (90.8 kg)   SpO2 100%   BMI 30.43 kg/m   GENERAL:alert, in no acute distress and comfortable SKIN: no acute rash is noted at this time EYES: normal, conjunctiva are pink and non-injected, sclera clear OROPHARYNX:no exudate, no erythema and lips,  buccal mucosa, and tongue normal  NECK: supple, no JVD, thyroid normal size, non-tender, without nodularity LYMPH:  no palpable lymphadenopathy in the cervical, axillary or inguinal LUNGS: clear to auscultation with normal respiratory effort HEART: regular rate & rhythm,  no murmurs and bilateral 2+ pitting pedal edema ABDOMEN: abdomen soft, non-tender, normoactive bowel sounds , no palpable hepatosplenomegaly Musculoskeletal: no cyanosis of digits and no clubbing  PSYCH: alert & oriented x 3 with fluent speech NEURO: no focal motor/sensory deficits  LABORATORY DATA:  I have reviewed the data as listed  . CBC Latest Ref Rng & Units 09/03/2015 08/05/2015 07/22/2015  WBC 4.0 - 10.5 K/uL 10.3 11.9(H) -  Hemoglobin 12.0 - 15.0 g/dL 10.4(L) 10.3(L) 10.2(L)  Hematocrit 36.0 - 46.0 % 31.4(L) 33.1(L) 32.7(L)  Platelets 150 - 400 K/uL 219 271 -   . CBC    Component Value Date/Time   WBC 10.3 09/03/2015 0730   RBC 4.11 09/03/2015 0730   HGB 10.4 (L) 09/03/2015 0730   HCT 31.4 (L) 09/03/2015 0730   PLT 219 09/03/2015 0730   MCV 76.4 (L) 09/03/2015 0730   MCH 25.3 (L) 09/03/2015 0730   MCHC 33.1 09/03/2015 0730   RDW 13.5 09/03/2015 0730   LYMPHSABS 3.9 08/05/2015 1003   MONOABS 0.9 08/05/2015 1003   EOSABS 1.7 (H) 08/05/2015 1003   BASOSABS 0.1 08/05/2015 1003    Component     Latest Ref Rng 06/28/2015 08/05/2015  IgG (Immunoglobin G), Serum     700 - 1600 mg/dL  2754 (H)  IgA     87 - 352 mg/dL 743 (H) 546 (H)  IgM, Serum     26 - 217 mg/dL  49  Total Protein ELP     6.0 - 8.5 g/dL  7.9  Albumin SerPl Elph-Mcnc     2.9 - 4.4 g/dL  2.4 (L)  Alpha 1     0.0 - 0.4 g/dL  0.2  Alpha2 Glob SerPl Elph-Mcnc     0.4 - 1.0 g/dL  1.3 (H)  B-Globulin SerPl Elph-Mcnc     0.7 -  1.3 g/dL  1.7 (H)  Gamma Glob SerPl Elph-Mcnc     0.4 - 1.8 g/dL  2.3 (H)  M Protein SerPl Elph-Mcnc     Not Observed g/dL  0.9 (H)  Globulin, Total     2.2 - 3.9 g/dL  5.5 (H)  Albumin/Glob SerPl     0.7 -  1.7  0.5 (L)  IFE 1       Comment  Please Note (HCV):       Comment  Hepatitis B Surface Ag     Negative Negative   HCV Ab     0.0 - 0.9 s/co ratio <0.1   Hep A Ab, IgM     Negative Negative   Hep B Core Ab, IgM     Negative Negative   Iron     28 - 170 ug/dL  93  TIBC     250 - 450 ug/dL  276  Saturation Ratios     10.4 - 31.8 %  34 (H)  UIBC       183  Anticardiolipin Ab,IgG,Qn     0 - 14 GPL U/mL <9   Anticardiolipin Ab,IgM,Qn     0 - 12 MPL U/mL <9   Anticardiolipin Ab,IgA,Qn     0 - 11 APL U/mL <9   PTT Lupus Anticoagulant     0.0 - 43.6 sec  33.9  DRVVT     0.0 - 47.0 sec  44.5  Lupus Anticoag Interp       Comment:  Kappa free light chain     3.3 - 19.4 mg/L  312.0 (H)  Lamda free light chains     5.7 - 26.3 mg/L  54.6 (H)  Kappa, lamda light chain ratio     0.26 - 1.65  5.71 (H)  Myeloperoxidase Abs     0.0 - 9.0 U/mL <9.0   ANCA Proteinase 3     0.0 - 3.5 U/mL <3.5   ds DNA Ab     0 - 9 IU/mL <1   ANA Ab, IFA      Negative   ASO     0.0 - 200.0 IU/mL 74.0   C3 Complement     82 - 167 mg/dL 169 (H)   Complement C4, Body Fluid     14 - 44 mg/dL 47 (H)   Cryoglobulin     None detected Comment   GBM Ab     0 - 20 units 4   HIV     Non Reactive Non Reactive   Sed Rate     0 - 22 mm/hr 17   Ferritin     11 - 307 ng/mL  73   Component     Latest Ref Rng 08/05/2015  Iron     28 - 170 ug/dL 93  TIBC     250 - 450 ug/dL 276  Saturation Ratios     10.4 - 31.8 % 34 (H)  UIBC      183  PTT Lupus Anticoagulant     0.0 - 43.6 sec 33.9  DRVVT     0.0 - 47.0 sec 44.5  Lupus Anticoag Interp      Comment:  Beta-2 Glycoprotein I Ab, IgG     0 - 20 GPI IgG units <9  Beta-2-Glycoprotein I IgM     0 - 32 GPI IgM units <9  Beta-2-Glycoprotein I IgA     0 - 25 GPI IgA units <9  Ferritin  11 - 307 ng/mL 73   Lupus anticoagulant panel      The value has a corrected status.      No reference range information available      Comments:            (NOTE)           No lupus anticoagulant was detected.           Performed At: [facility]           [place], [place] [id number]           Lindon Romp MD [id number]  Cryoglobulin      Reference Range: None detected      Comments:           (NOTE)           None Detected at 72 hours    Multiple Myeloma Panel (SPEP&IFE w/QIG)      The value has a corrected status.      No reference range information available      Comments:           (NOTE)           Immunofixation shows IgG monoclonal protein with kappa light           chain           specificity.           An apparent polyclonal gammopathy: IgG and IgA. Kappa and            lambda           typing appear increased.   . CMP Latest Ref Rng & Units 08/05/2015 07/22/2015 07/02/2015  Glucose 65 - 99 mg/dL 314(H) 191(H) 205(H)  BUN 6 - 20 mg/dL 36(H) 33(H) 14  Creatinine 0.44 - 1.00 mg/dL 1.35(H) 1.43(H) 0.91  Sodium 135 - 145 mmol/L 133(L) 134(L) 135  Potassium 3.5 - 5.1 mmol/L 3.8 4.7 3.8  Chloride 101 - 111 mmol/L 102 103 109  CO2 22 - 32 mmol/L 24 26 23   Calcium 8.9 - 10.3 mg/dL 8.9 8.5(L) 7.6(L)  Total Protein 6.5 - 8.1 g/dL 8.1 - -  Total Bilirubin 0.3 - 1.2 mg/dL 0.5 - -  Alkaline Phos 38 - 126 U/L 293(H) - -  AST 15 - 41 U/L 29 - -  ALT 14 - 54 U/L 37 - -        BONE MARROW BIOPSY         RADIOGRAPHIC STUDIES: I have personally reviewed the radiological images as listed and agreed with the findings in the report. 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 EXAM: CT GUIDED BONE MARROW ASPIRATION AND CORE BIOPSY Interventional Radiologist:  Criselda Peaches, MD MEDICATIONS: None. ANESTHESIA/SEDATION: Moderate (conscious) sedation was employed during this procedure. A total of 2 milligrams versed and 50 micrograms fentanyl were administered intravenously. The patient's level of consciousness and vital signs  were monitored continuously by radiology nursing throughout the procedure under my direct supervision. Total monitored sedation time: 14 minutes FLUOROSCOPY TIME:  None COMPLICATIONS: None immediate. Estimated blood loss: <25 mL PROCEDURE: Informed written consent was obtained from the patient after a thorough discussion of the procedural risks, benefits and alternatives. All questions were addressed. Maximal Sterile Barrier Technique was utilized including caps, mask, sterile gowns, sterile gloves, sterile drape, hand hygiene and skin antiseptic. A timeout was performed prior to the initiation of  the procedure. The patient was positioned prone and non-contrast localization CT was performed of the pelvis to demonstrate the iliac marrow spaces. Maximal barrier sterile technique utilized including caps, mask, sterile gowns, sterile gloves, large sterile drape, hand hygiene, and betadine prep. Under sterile conditions and local anesthesia, an 11 gauge coaxial bone biopsy needle was advanced into the right iliac marrow space. Needle position was confirmed with CT imaging. Initially, bone marrow aspiration was performed. Next, the 11 gauge outer cannula was utilized to obtain a right iliac bone marrow core biopsy. Needle was removed. Hemostasis was obtained with compression. The patient tolerated the procedure well. Samples were prepared with the cytotechnologist. IMPRESSION: Technically successful CT-guided bone marrow biopsy of the right iliac bone. Signed, Criselda Peaches, MD [facility] Electronically Signed   By: Jacqulynn Cadet M.D.   On: 09/03/2015 09:59  Study Result   CLINICAL DATA:  Staging myeloma  EXAM: METASTATIC BONE SURVEY  COMPARISON:  None.  FINDINGS: No lytic lesions are seen suggestive of myeloma.  Negative for fracture.  No worrisome bone lesion.  Degenerative changes throughout the spine most severe at L3-4  and L4-5. Grade 1 anterior slip L3-4 L4-5. Degenerative change in the shoulder and knees bilaterally.  IMPRESSION: Negative for myeloma.   Electronically Signed   By: Franchot Gallo M.D.   On: 08/12/2015 15:32    Bilateral venous duplex ultrasound 06/27/2015 IMPRESSION: Bilateral posterior tibial vein thrombus.   Electronically Signed  By: Inez Catalina M.D.  On: 06/27/2015 15:15   ASSESSMENT & PLAN:  BMBX with 9% kappa restricted plasma cells Bilateral DVT Anemia NEGATIVE myeloma survey Nephrotic syndrome 9g/24 hrs proteinuria ? 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 BMBX has approximately 9% plasma cells kappa restricted, given the definition of myeloma as detailed below [place] does not meet the criteria. However, I feel that she needs close observation. We discussed return in 2 to 3 months with ongoing labs. She agrees. She will continue her Eliquis. She will continue to follow with nephrology.  She has not had a renal biopsy, this may be the next step and we can discuss with her nephrologist, Dr. Burnett Sheng.     Orders Placed This Encounter  Procedures  . CBC with Differential    Standing Status:   Future    Standing Expiration Date:   09/11/2016  . Comprehensive metabolic panel    Standing Status:   Future    Standing Expiration Date:   09/11/2016  . Kappa/lambda light chains    Standing Status:   Future    Standing Expiration Date:   09/11/2016  . Immunofixation electrophoresis    Standing Status:   Future    Standing Expiration Date:   09/11/2016  . Protein electrophoresis, serum    Standing Status:   Future    Standing Expiration Date:   09/11/2016     All of the patients questions were answered with apparent satisfaction. The patient knows to call the clinic with any problems, questions or concerns.  This document serves as a record of services personally performed by  Ancil Linsey, MD. It was created on her behalf by Arlyce Harman, a trained medical scribe. The creation of this record is based on the scribe's personal observations and the provider's statements to them. This document has been checked and approved by the attending provider.  I have reviewed the above documentation for accuracy and  completeness, and I agree with the above.  Kelby Fam. Penland, MD  09/11/2015 4:41 PM

## 2015-09-12 ENCOUNTER — Encounter (HOSPITAL_COMMUNITY): Payer: Medicare Other | Attending: Hematology | Admitting: Hematology & Oncology

## 2015-09-12 ENCOUNTER — Encounter (HOSPITAL_COMMUNITY): Payer: Self-pay | Admitting: Hematology & Oncology

## 2015-09-12 ENCOUNTER — Encounter (HOSPITAL_COMMUNITY): Payer: Medicare Other

## 2015-09-12 VITALS — BP 103/47 | HR 75 | Temp 97.8°F | Resp 16 | Wt 200.1 lb

## 2015-09-12 DIAGNOSIS — M797 Fibromyalgia: Secondary | ICD-10-CM | POA: Diagnosis not present

## 2015-09-12 DIAGNOSIS — Z880 Allergy status to penicillin: Secondary | ICD-10-CM | POA: Insufficient documentation

## 2015-09-12 DIAGNOSIS — I82403 Acute embolism and thrombosis of unspecified deep veins of lower extremity, bilateral: Secondary | ICD-10-CM | POA: Diagnosis not present

## 2015-09-12 DIAGNOSIS — D649 Anemia, unspecified: Secondary | ICD-10-CM | POA: Diagnosis not present

## 2015-09-12 DIAGNOSIS — Z90710 Acquired absence of both cervix and uterus: Secondary | ICD-10-CM | POA: Diagnosis not present

## 2015-09-12 DIAGNOSIS — Z79899 Other long term (current) drug therapy: Secondary | ICD-10-CM | POA: Insufficient documentation

## 2015-09-12 DIAGNOSIS — R809 Proteinuria, unspecified: Secondary | ICD-10-CM

## 2015-09-12 DIAGNOSIS — Z9049 Acquired absence of other specified parts of digestive tract: Secondary | ICD-10-CM | POA: Insufficient documentation

## 2015-09-12 DIAGNOSIS — N049 Nephrotic syndrome with unspecified morphologic changes: Secondary | ICD-10-CM | POA: Diagnosis not present

## 2015-09-12 DIAGNOSIS — I10 Essential (primary) hypertension: Secondary | ICD-10-CM | POA: Diagnosis not present

## 2015-09-12 DIAGNOSIS — E119 Type 2 diabetes mellitus without complications: Secondary | ICD-10-CM | POA: Diagnosis not present

## 2015-09-12 DIAGNOSIS — Z9889 Other specified postprocedural states: Secondary | ICD-10-CM | POA: Diagnosis not present

## 2015-09-12 DIAGNOSIS — E039 Hypothyroidism, unspecified: Secondary | ICD-10-CM | POA: Insufficient documentation

## 2015-09-12 DIAGNOSIS — D472 Monoclonal gammopathy: Secondary | ICD-10-CM

## 2015-09-12 DIAGNOSIS — Z86718 Personal history of other venous thrombosis and embolism: Secondary | ICD-10-CM

## 2015-09-12 DIAGNOSIS — Z7901 Long term (current) use of anticoagulants: Secondary | ICD-10-CM | POA: Diagnosis not present

## 2015-09-12 DIAGNOSIS — D72822 Plasmacytosis: Secondary | ICD-10-CM

## 2015-09-12 DIAGNOSIS — Z87891 Personal history of nicotine dependence: Secondary | ICD-10-CM | POA: Diagnosis not present

## 2015-09-12 DIAGNOSIS — J449 Chronic obstructive pulmonary disease, unspecified: Secondary | ICD-10-CM | POA: Diagnosis not present

## 2015-09-12 LAB — COMPREHENSIVE METABOLIC PANEL
ALBUMIN: 2.8 g/dL — AB (ref 3.5–5.0)
ALK PHOS: 182 U/L — AB (ref 38–126)
ALT: 30 U/L (ref 14–54)
ANION GAP: 5 (ref 5–15)
AST: 18 U/L (ref 15–41)
BILIRUBIN TOTAL: 0.3 mg/dL (ref 0.3–1.2)
BUN: 43 mg/dL — ABNORMAL HIGH (ref 6–20)
CALCIUM: 8.5 mg/dL — AB (ref 8.9–10.3)
CO2: 25 mmol/L (ref 22–32)
Chloride: 103 mmol/L (ref 101–111)
Creatinine, Ser: 1.69 mg/dL — ABNORMAL HIGH (ref 0.44–1.00)
GFR, EST AFRICAN AMERICAN: 36 mL/min — AB (ref >60)
GFR, EST NON AFRICAN AMERICAN: 31 mL/min — AB (ref >60)
GLUCOSE: 276 mg/dL — AB (ref 65–99)
POTASSIUM: 4.2 mmol/L (ref 3.5–5.1)
Sodium: 133 mmol/L — ABNORMAL LOW (ref 135–145)
TOTAL PROTEIN: 7.9 g/dL (ref 6.5–8.1)

## 2015-09-12 LAB — CBC WITH DIFFERENTIAL/PLATELET
BASOS ABS: 0 10*3/uL (ref 0.0–0.1)
BASOS PCT: 0 %
EOS ABS: 1.3 10*3/uL — AB (ref 0.0–0.7)
EOS PCT: 14 %
HCT: 30.5 % — ABNORMAL LOW (ref 36.0–46.0)
Hemoglobin: 10.2 g/dL — ABNORMAL LOW (ref 12.0–15.0)
LYMPHS PCT: 33 %
Lymphs Abs: 3 10*3/uL (ref 0.7–4.0)
MCH: 25.8 pg — ABNORMAL LOW (ref 26.0–34.0)
MCHC: 33.4 g/dL (ref 30.0–36.0)
MCV: 77 fL — ABNORMAL LOW (ref 78.0–100.0)
MONO ABS: 0.9 10*3/uL (ref 0.1–1.0)
Monocytes Relative: 9 %
Neutro Abs: 4.1 10*3/uL (ref 1.7–7.7)
Neutrophils Relative %: 44 %
PLATELETS: 210 10*3/uL (ref 150–400)
RBC: 3.96 MIL/uL (ref 3.87–5.11)
RDW: 13.3 % (ref 11.5–15.5)
WBC: 9.3 10*3/uL (ref 4.0–10.5)

## 2015-09-12 LAB — RETICULOCYTES
RBC.: 3.96 MIL/uL (ref 3.87–5.11)
RETIC CT PCT: 3.7 % — AB (ref 0.4–3.1)
Retic Count, Absolute: 146.5 10*3/uL (ref 19.0–186.0)

## 2015-09-12 NOTE — Patient Instructions (Addendum)
[place] at [facility] Discharge Instructions  RECOMMENDATIONS MADE BY THE CONSULTANT AND ANY TEST RESULTS WILL BE SENT TO YOUR REFERRING PHYSICIAN.  You saw Dr. Whitney Muse today.  Return to clinic in 3 months with labs   Thank you for choosing [place] at [facility] to provide your oncology and hematology care.  To afford each patient quality time with our provider, please arrive at least 15 minutes before your scheduled appointment time.   Beginning January 23rd 2017 lab work for the [facility] will be done in the  Main lab at [facility] on 1st floor. If you have a lab appointment with the [facility] please come in thru the  Main Entrance and check in at the main information desk  You need to re-schedule your appointment should you arrive 10 or more minutes late.  We strive to give you quality time with our providers, and arriving late affects you and other patients whose appointments are after yours.  Also, if you no show three or more times for appointments you may be dismissed from the clinic at the providers discretion.     Again, thank you for choosing [facility].  Our hope is that these requests will decrease the amount of time that you wait before being seen by our physicians.       _____________________________________________________________  Should you have questions after your visit to [facility], please contact our office at [phone] between the hours of 8:30 a.m. and 4:30 p.m.  Voicemails left after 4:30 p.m. will not be returned until the following business day.  For prescription refill requests, have your pharmacy contact our office.         Resources For Cancer Patients and their Caregivers ? American Cancer Society: Can assist with transportation, wigs, general needs, runs Look Good Feel Better.        727-547-6431 ? Cancer Care: Provides financial assistance, online support  groups, medication/co-pay assistance.  1-800-813-HOPE 315 456 6752) ? [employer] Assists [place] cancer patients and their families through emotional , educational and financial support.  726-647-3727 ? [place] DSS Where to apply for food stamps, Medicaid and utility assistance. 6150086045 ? [facility]: Transportation to medical appointments. (463) 257-3963 ? Social Security Administration: May apply for disability if have a Stage IV cancer. 830-324-4846 (920)176-8749 ? [employer], Disability and Transit Services: Assists with nutrition, care and transit needs. [place] Support Programs: @10RELATIVEDAYS @ > Cancer Support Group  2nd Tuesday of the month 1pm-2pm, Journey Room  > Creative Journey  3rd Tuesday of the month 1130am-1pm, Journey Room  > Look Good Feel Better  1st Wednesday of the month 10am-12 noon, Journey Room (Call [place] to register [phone])

## 2015-09-16 DIAGNOSIS — E875 Hyperkalemia: Secondary | ICD-10-CM | POA: Diagnosis not present

## 2015-09-17 ENCOUNTER — Encounter (HOSPITAL_COMMUNITY): Payer: Self-pay

## 2015-09-25 DIAGNOSIS — H401131 Primary open-angle glaucoma, bilateral, mild stage: Secondary | ICD-10-CM | POA: Diagnosis not present

## 2015-10-05 ENCOUNTER — Encounter (HOSPITAL_COMMUNITY): Payer: Self-pay | Admitting: Hematology & Oncology

## 2015-10-14 DIAGNOSIS — R748 Abnormal levels of other serum enzymes: Secondary | ICD-10-CM | POA: Diagnosis not present

## 2015-10-14 DIAGNOSIS — D649 Anemia, unspecified: Secondary | ICD-10-CM | POA: Diagnosis not present

## 2015-10-15 LAB — MITOCHONDRIAL ANTIBODIES: MITOCHONDRIAL AB: 5.7 U (ref 0.0–20.0)

## 2015-10-15 LAB — GAMMA GT: GGT: 141 IU/L — AB (ref 0–60)

## 2015-10-23 ENCOUNTER — Ambulatory Visit (INDEPENDENT_AMBULATORY_CARE_PROVIDER_SITE_OTHER): Payer: Medicare Other | Admitting: Gastroenterology

## 2015-10-23 ENCOUNTER — Encounter: Payer: Self-pay | Admitting: Gastroenterology

## 2015-10-23 ENCOUNTER — Other Ambulatory Visit: Payer: Self-pay | Admitting: Gastroenterology

## 2015-10-23 VITALS — BP 138/79 | HR 99 | Temp 98.2°F | Ht 67.5 in | Wt 199.8 lb

## 2015-10-23 DIAGNOSIS — K5909 Other constipation: Secondary | ICD-10-CM

## 2015-10-23 DIAGNOSIS — R748 Abnormal levels of other serum enzymes: Secondary | ICD-10-CM

## 2015-10-23 DIAGNOSIS — R195 Other fecal abnormalities: Secondary | ICD-10-CM | POA: Diagnosis not present

## 2015-10-23 NOTE — Patient Instructions (Signed)
Please have blood work done today.   I would like to see you the end of October to arrange a colonoscopy. Hopefully, we can hold the blood thinner at that time. If anything changes with the plan for that before then, let me know.  I have given you samples of Linzess for constipation. We do not have samples of the lowest dosage of Linzess, so I am starting you on the medium dosage. I would like for you to try Linzess 145 mcg once each morning, 30 minutes before breakfast. Let me know how this works, and we can send to the pharmacy!

## 2015-10-23 NOTE — Progress Notes (Signed)
Referring Provider: [name], MD Primary Care Physician:  [name], MD  Primary GI: Dr. Gala Romney   Chief Complaint  Patient presents with  . Follow-up    doing ok    HPI:   Stephanie [name] is a 64 y.o. female presenting today with a history of fecal incontinence, heme positive stool. Low-volume hematochezia intermittently since Jan/Feb 2017.  Last colonoscopy in the late 90s, early 2000s by [facility]. 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 Was hospitalized in May 2017 with diarrhea and found to have Cdiff antigen positive but negative toxin. She was treated empirically and diarrhea resolved. Outside labs with Alk phos 314, Cr 1.42, Albumin 1.9. While inpatient, H/H in 9-11 range. US abdomen in April 2017 with fatty liver, no biliary duct dilatation. Isolated elevation of alk phos noted. Normal transaminases.   Needs TCS+/- EGD in future but will need to hold Eliquis for 48 hours prior, Chronic anemia, Hgb remaining stable. Ferritin normal at 73, iron normal at 93.   Isolated elevated alk phos with fatty liver on ultrasound. GGT elevated at 141, AMA, ANA negative. Will order ASMA to wrap up evaluation.   Sees Dr. Lowanda Foster Oct 4th, will decide about Eliquis then. Followed by Oncology with history of nephrotic syndrome, IgG kappa monoclonal gammopathy. She is being observed for now and has undergone extensive evaluation.   No rectal bleeding. Having issues with constipation. Doesn't take anything for it. Feels like needs something for it. Protonix for reflux once daily. Refluxcomes and goes. Has to take some baking soda occasionally. No dysphagia.   Past Medical History:  Diagnosis Date  . Anemia   . Asthma   . COPD (chronic obstructive pulmonary disease) ([place])   . Diabetes mellitus   . Fibromyalgia   . GERD (gastroesophageal reflux disease)   . H/O hiatal hernia   . Hypertension   . Hyperthyroidism   . IBS (irritable bowel syndrome)   . Inner ear disease   . PONV (postoperative  nausea and vomiting)   . Tachycardia     Past Surgical History:  Procedure Laterality Date  . ABDOMINAL HYSTERECTOMY  partial  . CARPAL TUNNEL RELEASE Right 1991  . CATARACT EXTRACTION W/PHACO Right 05/08/2013   Procedure: CATARACT EXTRACTION PHACO AND INTRAOCULAR LENS PLACEMENT (IOC);  Surgeon: Tonny Branch, MD;  Location: [facility];  Service: Ophthalmology;  Laterality: Right;  CDE 10.31  . CATARACT EXTRACTION W/PHACO Left 08/17/2013   Procedure: CATARACT EXTRACTION PHACO AND INTRAOCULAR LENS PLACEMENT (IOC);  Surgeon: Tonny Branch, MD;  Location: [facility];  Service: Ophthalmology;  Laterality: Left;  CDE:9.03  . CHOLECYSTECTOMY    . DENTAL SURGERY    . WRIST GANGLION EXCISION Left     Current Outpatient Prescriptions  Medication Sig Dispense Refill  . albuterol (PROVENTIL HFA;VENTOLIN HFA) 108 (90 BASE) MCG/ACT inhaler Inhale 2 puffs into the lungs every 6 (six) hours as needed for wheezing or shortness of breath.    Marland Kitchen albuterol (PROVENTIL) (2.5 MG/3ML) 0.083% nebulizer solution Take 2.5 mg by nebulization every 6 (six) hours as needed for wheezing or shortness of breath.    Marland Kitchen apixaban (ELIQUIS) 5 MG TABS tablet Take 10 mg by mouth BID through Friday 5/19 evening. Take 37m by mouth BID starting 5/20 AM. 60 tablet 3  . Bilberry, Vaccinium myrtillus, 100 MG CAPS Take 1 capsule by mouth 2 (two) times daily.    . cetirizine (ZYRTEC) 10 MG tablet Take 10 mg by mouth daily.    . Cholecalciferol (VITAMIN  D) 1000 UNITS capsule Take 2,000 Units by mouth 2 (two) times daily.     . clobetasol ointment (TEMOVATE) 2.35 % Apply 1 application topically 2 (two) times daily.     . Cyanocobalamin (VITAMIN B-12 PO) Take 1 tablet by mouth daily.      . DULoxetine (CYMBALTA) 30 MG capsule Take 60 mg by mouth 2 (two) times daily.     Marland Kitchen HYDROcodone-acetaminophen (NORCO/VICODIN) 5-325 MG tablet Take 1 tablet by mouth every 4 (four) hours as needed. 15 tablet 0  . hydrOXYzine (ATARAX/VISTARIL) 25 MG tablet Take 25 mg by  mouth every 8 (eight) hours as needed.     . insulin glargine (LANTUS) 100 UNIT/ML injection Inject 60 Units into the skin 2 (two) times daily.     . JENTADUETO 2.5-500 MG TABS Take 1 tablet by mouth 2 (two) times daily.     Marland Kitchen levothyroxine (SYNTHROID, LEVOTHROID) 100 MCG tablet Take 100 mcg by mouth daily before breakfast.   0  . Melatonin 5 MG CAPS Take 2 capsules by mouth at bedtime.    . metolazone (ZAROXOLYN) 5 MG tablet Take 1 tablet (5 mg total) by mouth 2 (two) times daily. 60 tablet 0  . metoprolol (LOPRESSOR) 100 MG tablet TAKE 1 TABLET BY MOUTH TWICE DAILY **TAKE WITH 25MG TABLET FOR TOTAL DOSAGE OF 125MG** 180 tablet 1  . mupirocin ointment (BACTROBAN) 2 % Apply 1 application topically 2 (two) times daily.     . pantoprazole (PROTONIX) 40 MG tablet Take 40 mg by mouth daily.     . pravastatin (PRAVACHOL) 40 MG tablet Take 40 mg by mouth daily.     Marland Kitchen PULMICORT FLEXHALER 180 MCG/ACT inhaler Inhale 2 puffs into the lungs daily as needed (for shortness of breath-wheezing).     . torsemide (DEMADEX) 20 MG tablet Take 2 tablets (40 mg total) by mouth daily. (Patient taking differently: Take 60 mg by mouth daily. ) 60 tablet 0  . traMADol (ULTRAM) 50 MG tablet Take 1 tablet (50 mg total) by mouth every 6 (six) hours as needed. 16 tablet 0  . vitamin C (ASCORBIC ACID) 500 MG tablet Take 1,000 mg by mouth daily.    . vitamin E 400 UNIT capsule Take 400 Units by mouth daily.    . hydrocortisone (ANUSOL-HC) 2.5 % rectal cream Place 1 application rectally 2 (two) times daily. (Patient not taking: Reported on 10/23/2015) 30 g 1  . LYRICA 75 MG capsule Take 75 mg by mouth 2 (two) times daily.     . potassium chloride SA (K-DUR,KLOR-CON) 20 MEQ tablet Take 1 tablet (20 mEq total) by mouth daily. (Patient not taking: Reported on 09/12/2015) 30 tablet 0   No current facility-administered medications for this visit.     Allergies as of 10/23/2015 - Review Complete 10/23/2015  Allergen Reaction Noted    . Tetracyclines & related Anaphylaxis 09/19/2010  . Banana Hives and Nausea And Vomiting 05/04/2013  . Penicillins Rash 09/19/2010    Family History  Problem Relation Age of Onset  . Colon cancer Neg Hx     Social History   Social History  . Marital status: Married    Spouse name: N/A  . Number of children: N/A  . Years of education: N/A   Social History Main Topics  . Smoking status: Former Smoker    Packs/day: 0.25    Years: 30.00    Types: Cigarettes    Quit date: 02/18/2015  . Smokeless tobacco: Never Used  Comment: some day smoker  . Alcohol use No  . Drug use: No  . Sexual activity: Yes    Birth control/ protection: Surgical   Other Topics Concern  . None   Social History Narrative  . None    Review of Systems: As mentioned in HPI   Physical Exam: BP 138/79   Pulse 99   Temp 98.2 F (36.8 C) (Oral)   Ht 5' 7.5" (1.715 m)   Wt 199 lb 12.8 oz (90.6 kg)   BMI 30.83 kg/m  General:   Alert and oriented. No distress noted. Pleasant and cooperative.  Head:  Normocephalic and atraumatic. Eyes:  Conjuctiva clear without scleral icterus. Heart:  S1, S2 present without murmurs, rubs, or gallops. Regular rate and rhythm. Abdomen:  +BS, soft, non-tender and non-distended. No rebound or guarding. No HSM or masses noted. Extremities:  Without edema. Neurologic:  Alert and  oriented x4;  grossly normal neurologically. Psych:  Alert and cooperative. Normal mood and affect.  Lab Results  Component Value Date   ALT 30 09/12/2015   AST 18 09/12/2015   GGT 141 (H) 10/14/2015   ALKPHOS 182 (H) 09/12/2015   BILITOT 0.3 09/12/2015   Lab Results  Component Value Date   ANA Negative 06/28/2015

## 2015-10-24 LAB — ANTI-SMOOTH MUSCLE ANTIBODY, IGG: SMOOTH MUSCLE AB: 34 U — AB (ref 0–19)

## 2015-10-27 DIAGNOSIS — K5900 Constipation, unspecified: Secondary | ICD-10-CM | POA: Insufficient documentation

## 2015-10-27 NOTE — Assessment & Plan Note (Signed)
Low-volume hematochezia in remote past, on Eliquis since May for DVT, with upcoming appt Oct 4th to discuss continued therapy. May be able to safely hold after October for procedure. Ferritin normal at 73, iron 93. Last colonoscopy in the late 90s/2000s by [facility]. Ultimately needs TCS+/- EGD and will hold Eliquis for 48 hours prior. Currently without any overt GI bleeding. Return in Oct 2017 to arrange colonoscopy, as she will have been on anticoagulation for 6 months and could likely hold anticoagulation.

## 2015-10-27 NOTE — Assessment & Plan Note (Signed)
Start Linzess 145 mcg daily. Samples provided. Patient to call if needs adjustment in dosage.

## 2015-10-27 NOTE — Assessment & Plan Note (Signed)
In setting of fatty liver. Elevated GGT. AMA, ANA negative. Order ASMA.

## 2015-10-28 NOTE — Progress Notes (Signed)
CC'ED TO PCP 

## 2015-10-30 ENCOUNTER — Telehealth: Payer: Self-pay | Admitting: Internal Medicine

## 2015-10-30 NOTE — Telephone Encounter (Signed)
Routing to the refill box. 

## 2015-10-30 NOTE — Telephone Encounter (Signed)
PATIENT CALLED AND STATED THAT THE SAMPLES OF LINZESS WORKED AND WOULD LIKE A PRESCRIPTION CALLED INTO PHARMACY [facility] IN [place]     ALSO THE PATIENT WANTS TO KNOW IF SHE SHOULD TAKE THEM DAILY [phone]

## 2015-11-01 MED ORDER — LINACLOTIDE 145 MCG PO CAPS: 145.0000 ug | ORAL_CAPSULE | Freq: Every day | ORAL | 11 refills | Status: DC

## 2015-11-01 NOTE — Addendum Note (Signed)
Addended by: Mahala Menghini on: 11/01/2015 12:55 PM   Modules accepted: Orders

## 2015-11-04 ENCOUNTER — Ambulatory Visit (INDEPENDENT_AMBULATORY_CARE_PROVIDER_SITE_OTHER): Payer: Medicare Other | Admitting: Cardiovascular Disease

## 2015-11-04 ENCOUNTER — Encounter: Payer: Self-pay | Admitting: Cardiovascular Disease

## 2015-11-04 VITALS — BP 111/69 | HR 90 | Ht 67.0 in | Wt 207.0 lb

## 2015-11-04 DIAGNOSIS — I10 Essential (primary) hypertension: Secondary | ICD-10-CM

## 2015-11-04 DIAGNOSIS — R000 Tachycardia, unspecified: Secondary | ICD-10-CM

## 2015-11-04 DIAGNOSIS — R600 Localized edema: Secondary | ICD-10-CM | POA: Diagnosis not present

## 2015-11-04 DIAGNOSIS — R002 Palpitations: Secondary | ICD-10-CM

## 2015-11-04 NOTE — Patient Instructions (Signed)
Your physician wants you to follow-up in: 1 year Dr Koneswaran You will receive a reminder letter in the mail two months in advance. If you don't receive a letter, please call our office to schedule the follow-up appointment.     Your physician recommends that you continue on your current medications as directed. Please refer to the Current Medication list given to you today.     Thank you for choosing [facility] !        

## 2015-11-04 NOTE — Progress Notes (Signed)
SUBJECTIVE: The patient presents for routine follow-up. She has a history of inappropriate sinus tachycardia and hypertension. Saw K. Lawrence NP in June and there is mention of a h/o chronic diastolic HF, with diuretics managed by nephrology.  She said "I have good days and bad days ". She denies chest pain and leg swelling. She wears compression stockings. She has occasional shortness of breath. Denies palpitations.   Review of Systems: As per "subjective", otherwise negative.  Allergies  Allergen Reactions  . Tetracyclines & Related Anaphylaxis  . Banana Hives and Nausea And Vomiting  . Penicillins Rash    Has patient had a PCN reaction causing immediate rash, facial/tongue/throat swelling, SOB or lightheadedness with hypotension: No Has patient had a PCN reaction causing severe rash involving mucus membranes or skin necrosis: No Has patient had a PCN reaction that required hospitalization No Has patient had a PCN reaction occurring within the last 10 years: No If all of the above answers are "NO", then may proceed with Cephalosporin use.     Current Outpatient Prescriptions  Medication Sig Dispense Refill  . albuterol (PROVENTIL HFA;VENTOLIN HFA) 108 (90 BASE) MCG/ACT inhaler Inhale 2 puffs into the lungs every 6 (six) hours as needed for wheezing or shortness of breath.    Marland Kitchen albuterol (PROVENTIL) (2.5 MG/3ML) 0.083% nebulizer solution Take 2.5 mg by nebulization every 6 (six) hours as needed for wheezing or shortness of breath.    Marland Kitchen apixaban (ELIQUIS) 5 MG TABS tablet Take 10 mg by mouth BID through Friday 5/19 evening. Take 33m by mouth BID starting 5/20 AM. 60 tablet 3  . Bilberry, Vaccinium myrtillus, 100 MG CAPS Take 1 capsule by mouth 2 (two) times daily.    . cetirizine (ZYRTEC) 10 MG tablet Take 10 mg by mouth daily.    . Cholecalciferol (VITAMIN D) 1000 UNITS capsule Take 2,000 Units by mouth 2 (two) times daily.     . clobetasol ointment (TEMOVATE) 03.00% Apply 1  application topically 2 (two) times daily.     . Cyanocobalamin (VITAMIN B-12 PO) Take 1 tablet by mouth daily.      . DULoxetine (CYMBALTA) 30 MG capsule Take 60 mg by mouth 2 (two) times daily.     .Marland KitchenHYDROcodone-acetaminophen (NORCO/VICODIN) 5-325 MG tablet Take 1 tablet by mouth every 4 (four) hours as needed. 15 tablet 0  . hydrOXYzine (ATARAX/VISTARIL) 25 MG tablet Take 25 mg by mouth every 8 (eight) hours as needed.     . insulin glargine (LANTUS) 100 UNIT/ML injection Inject 60 Units into the skin 2 (two) times daily.     . JENTADUETO 2.5-500 MG TABS Take 1 tablet by mouth 2 (two) times daily.     .Marland Kitchenlevothyroxine (SYNTHROID, LEVOTHROID) 100 MCG tablet Take 100 mcg by mouth daily before breakfast.   0  . linaclotide (LINZESS) 145 MCG CAPS capsule Take 1 capsule (145 mcg total) by mouth daily before breakfast. 30 capsule 11  . LYRICA 75 MG capsule Take 75 mg by mouth 2 (two) times daily.     . Melatonin 5 MG CAPS Take 2 capsules by mouth at bedtime.    . metolazone (ZAROXOLYN) 5 MG tablet Take 1 tablet (5 mg total) by mouth 2 (two) times daily. 60 tablet 0  . metoprolol (LOPRESSOR) 100 MG tablet TAKE 1 TABLET BY MOUTH TWICE DAILY **TAKE WITH 25MG TABLET FOR TOTAL DOSAGE OF 125MG** 180 tablet 1  . mupirocin ointment (BACTROBAN) 2 % Apply 1 application topically  2 (two) times daily.     . pantoprazole (PROTONIX) 40 MG tablet Take 40 mg by mouth daily.     . pravastatin (PRAVACHOL) 40 MG tablet Take 40 mg by mouth daily.     Marland Kitchen PULMICORT FLEXHALER 180 MCG/ACT inhaler Inhale 2 puffs into the lungs daily as needed (for shortness of breath-wheezing).     . torsemide (DEMADEX) 20 MG tablet Take 2 tablets (40 mg total) by mouth daily. (Patient taking differently: Take 60 mg by mouth daily. ) 60 tablet 0  . traMADol (ULTRAM) 50 MG tablet Take 1 tablet (50 mg total) by mouth every 6 (six) hours as needed. 16 tablet 0  . vitamin C (ASCORBIC ACID) 500 MG tablet Take 1,000 mg by mouth daily.    . vitamin  E 400 UNIT capsule Take 400 Units by mouth daily.     No current facility-administered medications for this visit.     Past Medical History:  Diagnosis Date  . Anemia   . Asthma   . COPD (chronic obstructive pulmonary disease) ([place])   . Diabetes mellitus   . Fibromyalgia   . GERD (gastroesophageal reflux disease)   . H/O hiatal hernia   . Hypertension   . Hyperthyroidism   . IBS (irritable bowel syndrome)   . Inner ear disease   . PONV (postoperative nausea and vomiting)   . Tachycardia     Past Surgical History:  Procedure Laterality Date  . ABDOMINAL HYSTERECTOMY  partial  . CARPAL TUNNEL RELEASE Right 1991  . CATARACT EXTRACTION W/PHACO Right 05/08/2013   Procedure: CATARACT EXTRACTION PHACO AND INTRAOCULAR LENS PLACEMENT (IOC);  Surgeon: Tonny Branch, MD;  Location: [facility];  Service: Ophthalmology;  Laterality: Right;  CDE 10.31  . CATARACT EXTRACTION W/PHACO Left 08/17/2013   Procedure: CATARACT EXTRACTION PHACO AND INTRAOCULAR LENS PLACEMENT (IOC);  Surgeon: Tonny Branch, MD;  Location: [facility];  Service: Ophthalmology;  Laterality: Left;  CDE:9.03  . CHOLECYSTECTOMY    . DENTAL SURGERY    . WRIST GANGLION EXCISION Left     Social History   Social History  . Marital status: Married    Spouse name: N/A  . Number of children: N/A  . Years of education: N/A   Occupational History  . Not on file.   Social History Main Topics  . Smoking status: Former Smoker    Packs/day: 0.25    Years: 30.00    Types: Cigarettes    Quit date: 02/18/2015  . Smokeless tobacco: Never Used     Comment: some day smoker  . Alcohol use No  . Drug use: No  . Sexual activity: Yes    Birth control/ protection: Surgical   Other Topics Concern  . Not on file   Social History Narrative  . No narrative on file     Vitals:   11/04/15 1454  BP: 111/69  Pulse: 90  SpO2: 99%  Weight: 207 lb (93.9 kg)  Height: 5' 7"  (1.702 m)    PHYSICAL EXAM General: NAD HEENT: Normal. Neck: No  JVD, no thyromegaly. Lungs: Clear to auscultation bilaterally with normal respiratory effort. CV: Nondisplaced PMI.  Regular rate and rhythm, normal S1/S2, no S3/S4, no murmur. No pretibial or periankle edema. Wearing compression stockings. No carotid bruit.   Abdomen: Soft, nontender, no distention.  Neurologic: Alert and oriented.  Psych: Normal affect. Skin: Normal. Musculoskeletal: No gross deformities.    ECG: Most recent ECG reviewed.      ASSESSMENT AND PLAN: 1.  Palps/inappropriate sinus tach: Stable on metoprolol. No changes.  2. HTN: Controlled. No changes.  3. Bilateral leg edema: Wears compression stockings. No change to diuretics.  Dispo: fu 1 year.   Kate Sable, M.D., F.A.C.C.

## 2015-11-08 DIAGNOSIS — I10 Essential (primary) hypertension: Secondary | ICD-10-CM | POA: Diagnosis not present

## 2015-11-08 DIAGNOSIS — R252 Cramp and spasm: Secondary | ICD-10-CM | POA: Diagnosis not present

## 2015-11-08 DIAGNOSIS — Z86718 Personal history of other venous thrombosis and embolism: Secondary | ICD-10-CM | POA: Diagnosis not present

## 2015-11-08 DIAGNOSIS — E1121 Type 2 diabetes mellitus with diabetic nephropathy: Secondary | ICD-10-CM | POA: Diagnosis not present

## 2015-11-08 DIAGNOSIS — N189 Chronic kidney disease, unspecified: Secondary | ICD-10-CM | POA: Diagnosis not present

## 2015-11-08 DIAGNOSIS — M545 Low back pain: Secondary | ICD-10-CM | POA: Diagnosis not present

## 2015-11-08 DIAGNOSIS — E6609 Other obesity due to excess calories: Secondary | ICD-10-CM | POA: Diagnosis not present

## 2015-11-13 NOTE — Progress Notes (Signed)
Smooth Muscle antibody weakly positive elevated. Alk phos improving. Transaminases normal. Let's recheck LFTs when she returns in October. Could consider liver biopsy vs following LFTs.

## 2015-11-19 DIAGNOSIS — E559 Vitamin D deficiency, unspecified: Secondary | ICD-10-CM | POA: Diagnosis not present

## 2015-11-19 DIAGNOSIS — N183 Chronic kidney disease, stage 3 (moderate): Secondary | ICD-10-CM | POA: Diagnosis not present

## 2015-11-19 DIAGNOSIS — Z79899 Other long term (current) drug therapy: Secondary | ICD-10-CM | POA: Diagnosis not present

## 2015-11-19 DIAGNOSIS — R809 Proteinuria, unspecified: Secondary | ICD-10-CM | POA: Diagnosis not present

## 2015-11-19 DIAGNOSIS — D509 Iron deficiency anemia, unspecified: Secondary | ICD-10-CM | POA: Diagnosis not present

## 2015-11-19 DIAGNOSIS — I10 Essential (primary) hypertension: Secondary | ICD-10-CM | POA: Diagnosis not present

## 2015-11-20 DIAGNOSIS — R809 Proteinuria, unspecified: Secondary | ICD-10-CM | POA: Diagnosis not present

## 2015-11-20 DIAGNOSIS — N183 Chronic kidney disease, stage 3 (moderate): Secondary | ICD-10-CM | POA: Diagnosis not present

## 2015-11-20 DIAGNOSIS — E871 Hypo-osmolality and hyponatremia: Secondary | ICD-10-CM | POA: Diagnosis not present

## 2015-11-20 DIAGNOSIS — E559 Vitamin D deficiency, unspecified: Secondary | ICD-10-CM | POA: Diagnosis not present

## 2015-12-02 ENCOUNTER — Ambulatory Visit (INDEPENDENT_AMBULATORY_CARE_PROVIDER_SITE_OTHER): Payer: Medicare Other | Admitting: Otolaryngology

## 2015-12-06 DIAGNOSIS — R252 Cramp and spasm: Secondary | ICD-10-CM | POA: Diagnosis not present

## 2015-12-06 DIAGNOSIS — Z86718 Personal history of other venous thrombosis and embolism: Secondary | ICD-10-CM | POA: Diagnosis not present

## 2015-12-06 DIAGNOSIS — E6609 Other obesity due to excess calories: Secondary | ICD-10-CM | POA: Diagnosis not present

## 2015-12-06 DIAGNOSIS — M545 Low back pain: Secondary | ICD-10-CM | POA: Diagnosis not present

## 2015-12-06 DIAGNOSIS — E1121 Type 2 diabetes mellitus with diabetic nephropathy: Secondary | ICD-10-CM | POA: Diagnosis not present

## 2015-12-06 DIAGNOSIS — I10 Essential (primary) hypertension: Secondary | ICD-10-CM | POA: Diagnosis not present

## 2015-12-06 DIAGNOSIS — N189 Chronic kidney disease, unspecified: Secondary | ICD-10-CM | POA: Diagnosis not present

## 2015-12-09 DIAGNOSIS — R21 Rash and other nonspecific skin eruption: Secondary | ICD-10-CM | POA: Diagnosis not present

## 2015-12-09 DIAGNOSIS — E1165 Type 2 diabetes mellitus with hyperglycemia: Secondary | ICD-10-CM | POA: Diagnosis not present

## 2015-12-09 DIAGNOSIS — E1121 Type 2 diabetes mellitus with diabetic nephropathy: Secondary | ICD-10-CM | POA: Diagnosis not present

## 2015-12-09 DIAGNOSIS — R252 Cramp and spasm: Secondary | ICD-10-CM | POA: Diagnosis not present

## 2015-12-09 DIAGNOSIS — I10 Essential (primary) hypertension: Secondary | ICD-10-CM | POA: Diagnosis not present

## 2015-12-11 ENCOUNTER — Telehealth: Payer: Self-pay | Admitting: Internal Medicine

## 2015-12-11 NOTE — Telephone Encounter (Signed)
Pt has OV on 10/30 but when she called today she said that we needed to contact her heart doctor about scheduling procedure. Please call patient at [phone]

## 2015-12-12 NOTE — Telephone Encounter (Signed)
That would be great. Need to know if we can hold anticoagulation X 48 hours (Eliquis I believe she is on).

## 2015-12-12 NOTE — Telephone Encounter (Signed)
Routing note to cardiology for recommendations on holding medication.

## 2015-12-12 NOTE — Telephone Encounter (Signed)
Patient is on eliquis for history of DVT, we have not been managing her anticoagulation. Please contact her heme/oncologist Dr Irene Limbo, he describes the indications for anticoagulation in his 08/05/15 clinic note and would be better suited to answer your question   Stephanie Abts MD

## 2015-12-12 NOTE — Telephone Encounter (Signed)
Routing to AB. Pt has ov with you on 12/16/15. Do you want to contact cardiology prior to her appt??

## 2015-12-12 NOTE — Telephone Encounter (Signed)
Routing to Dr.Kale for advise on holding eloquis prior to patient having procedures.

## 2015-12-13 ENCOUNTER — Other Ambulatory Visit (HOSPITAL_COMMUNITY): Payer: Medicare Other

## 2015-12-13 ENCOUNTER — Encounter (HOSPITAL_COMMUNITY): Payer: Self-pay | Admitting: Oncology

## 2015-12-13 ENCOUNTER — Ambulatory Visit (HOSPITAL_COMMUNITY): Payer: Medicare Other | Admitting: Oncology

## 2015-12-13 DIAGNOSIS — D472 Monoclonal gammopathy: Secondary | ICD-10-CM | POA: Insufficient documentation

## 2015-12-13 HISTORY — DX: Monoclonal gammopathy: D47.2

## 2015-12-13 NOTE — Assessment & Plan Note (Deleted)
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 On Eliquis.  In addition to multiple other acquired risk factors including relative immobility, chronic pedal edema, multiple medical comorbidities her newly noted nephrotic syndrome is a large risk factor. Antiphospholipid antibody panel has been negative.  Continue Eliquis.

## 2015-12-13 NOTE — Progress Notes (Deleted)
Stephanie Simmer, MD [address] [place] [address]  MGUS (monoclonal gammopathy of unknown significance)  Acute deep vein thrombosis (DVT) of tibial vein of both lower extremities (HCC)  CURRENT THERAPY: Eliquis  INTERVAL HISTORY: Stephanie [name] 65 y.o. female returns for followup of MGUS with less < 3 g/dL monoclonal protein, bone marrow with 9% monoclonal plasma cells, and absence of end-organ damage (although no clear etiology of renal disease is noted but could be from other co-morbidities), and negative skeletal survey. 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 On Eliquis.  In addition to multiple other acquired risk factors including relative immobility, chronic pedal edema, multiple medical comorbidities her newly noted nephrotic syndrome is a large risk factor. Antiphospholipid antibody panel has been negative.  ROS  Past Medical History:  Diagnosis Date  . Anemia   . Asthma   . COPD (chronic obstructive pulmonary disease) ([place])   . Deep vein thrombosis (DVT) of both lower extremities ([place]) 06/27/2015  . Diabetes mellitus   . Fibromyalgia   . GERD (gastroesophageal reflux disease)   . H/O hiatal hernia   . Hypertension   . Hyperthyroidism   . IBS (irritable bowel syndrome)   . Inner ear disease   . 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 PONV (postoperative nausea and vomiting)   . Tachycardia     Past Surgical History:  Procedure Laterality Date  . ABDOMINAL HYSTERECTOMY  partial  . CARPAL TUNNEL RELEASE Right 1991  . CATARACT EXTRACTION W/PHACO Right 05/08/2013   Procedure: CATARACT EXTRACTION PHACO AND INTRAOCULAR LENS PLACEMENT (IOC);  Surgeon: Tonny Branch, MD;  Location: [facility];  Service: Ophthalmology;  Laterality: Right;  CDE 10.31  . CATARACT EXTRACTION W/PHACO Left 08/17/2013   Procedure: CATARACT EXTRACTION PHACO AND INTRAOCULAR LENS PLACEMENT (IOC);  Surgeon: Tonny Branch, MD;  Location: [facility];  Service: Ophthalmology;  Laterality: Left;  CDE:9.03  . CHOLECYSTECTOMY    . DENTAL SURGERY    . WRIST GANGLION EXCISION Left     Family History  Problem Relation Age of Onset  . Colon cancer Neg Hx     Social History   Social History  . Marital status: Married    Spouse name: N/A  . Number of children: N/A  . Years of education: N/A   Social History Main Topics  . Smoking status: Former Smoker    Packs/day: 0.25    Years: 30.00    Types: Cigarettes    Quit date: 02/18/2015  . Smokeless tobacco: Never Used     Comment: some day smoker  . Alcohol use No  . Drug use: No  . Sexual activity: Yes    Birth control/ protection: Surgical   Other Topics Concern  . Not on file   Social History Narrative  . No narrative on file     PHYSICAL EXAMINATION  ECOG PERFORMANCE STATUS: {CHL ONC ECOG PS:[phone]}  There were no vitals filed for this visit.  GENERAL:{CHL ONC PE GENERAL:[phone]} SKIN: {CHL ONC PE JI:[id number] HEAD: {CHL ONC PE DC:184310 EYES: {CHL ONC PE WX:[id number] EARS: {CHL ONC PE DJ:[id number] OROPHARYNX:{CHL ONC PE [phone]}  NECK: {CHL ONC PE OK:[id number] LYMPH:  {CHL ONC PE SG:[id number] BREAST:{CHL ONC PE BREAST:[phone]} LUNGS: {CHL ONC PE CW:[id number] HEART: {CHL ONC PE TN:[id number] ABDOMEN:{CHL ONC PE [phone]} BACK: {CHL ONC PE EV:[id number] EXTREMITIES:{CHL ONC PE EXTREMITIES:[phone]}  NEURO: {CHL ONC PE NEURO:[phone]} PELVIC:{CHL ONC PE [phone]} RECTAL: {CHL ONC PE RECTAL:[phone]}  LABORATORY DATA: CBC    Component Value Date/Time   WBC 9.3 09/12/2015 1111   RBC 3.96 09/12/2015 1111   RBC 3.96 09/12/2015 1111   HGB 10.2 (L) 09/12/2015 1111   HCT 30.5 (L) 09/12/2015 1111   PLT 210 09/12/2015 1111   MCV 77.0 (L) 09/12/2015 1111   MCH 25.8 (L) 09/12/2015 1111   MCHC 33.4 09/12/2015 1111   RDW 13.3 09/12/2015 1111   LYMPHSABS 3.0 09/12/2015  1111   MONOABS 0.9 09/12/2015 1111   EOSABS 1.3 (H) 09/12/2015 1111   BASOSABS 0.0 09/12/2015 1111      Chemistry      Component Value Date/Time   NA 133 (L) 09/12/2015 1111   K 4.2 09/12/2015 1111   CL 103 09/12/2015 1111   CO2 25 09/12/2015 1111   BUN 43 (H) 09/12/2015 1111   CREATININE 1.69 (H) 09/12/2015 1111      Component Value Date/Time   CALCIUM 8.5 (L) 09/12/2015 1111   ALKPHOS 182 (H) 09/12/2015 1111   AST 18 09/12/2015 1111   ALT 30 09/12/2015 1111   BILITOT 0.3 09/12/2015 1111        PENDING LABS:   RADIOGRAPHIC STUDIES:  No results found.   PATHOLOGY:    ASSESSMENT AND PLAN:  MGUS (monoclonal gammopathy of unknown significance) MGUS with less < 3 g/dL monoclonal protein, bone marrow with 9% monoclonal plasma cells, and absence of end-organ damage (although no clear etiology of renal disease is noted but could be from other co-morbidities), and negative skeletal survey.  Labs today: CBC diff, CMET, SPEP+IFE, light chain assay.  I personally reviewed and went over laboratory results with the patient.  The results are noted within this dictation.  Labs in 3 months: CBC diff, CMET, SPEP+IFE, light chains assay, and IgG, IgA, IgM.  Return in 3 months for follow-up.   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 On Eliquis.  In addition to multiple other acquired risk factors including relative immobility, chronic pedal edema, multiple medical comorbidities her newly noted nephrotic syndrome is a large risk factor. Antiphospholipid antibody panel has been negative.  Continue Eliquis.   ORDERS PLACED FOR THIS ENCOUNTER: No orders of the defined types were placed in this encounter.   MEDICATIONS PRESCRIBED THIS ENCOUNTER: No orders of the defined types were placed in this encounter.   THERAPY PLAN:  Ongoing surveillance  All questions were answered. The patient knows to  call the clinic with any problems, questions or concerns. We can certainly see the patient much sooner if necessary.  Patient and plan discussed with Dr. Ancil Linsey and she is in agreement with the aforementioned.   This note is electronically signed by: Doy Mince 12/13/2015 9:45 AM

## 2015-12-13 NOTE — Assessment & Plan Note (Deleted)
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  Labs today: CBC diff, CMET, SPEP+IFE, light chain assay.  I personally reviewed and went over laboratory results with the patient.  The results are noted within this dictation.  Labs in 3 months: CBC diff, CMET, SPEP+IFE, light chains assay, and IgG, IgA, IgM.  Return in 3 months for follow-up.

## 2015-12-16 ENCOUNTER — Telehealth: Payer: Self-pay

## 2015-12-16 ENCOUNTER — Ambulatory Visit (INDEPENDENT_AMBULATORY_CARE_PROVIDER_SITE_OTHER): Payer: Medicare Other | Admitting: Gastroenterology

## 2015-12-16 ENCOUNTER — Other Ambulatory Visit: Payer: Self-pay | Admitting: Gastroenterology

## 2015-12-16 ENCOUNTER — Ambulatory Visit (INDEPENDENT_AMBULATORY_CARE_PROVIDER_SITE_OTHER): Payer: Medicare Other | Admitting: Otolaryngology

## 2015-12-16 ENCOUNTER — Other Ambulatory Visit: Payer: Self-pay

## 2015-12-16 ENCOUNTER — Other Ambulatory Visit (INDEPENDENT_AMBULATORY_CARE_PROVIDER_SITE_OTHER): Payer: Self-pay | Admitting: Otolaryngology

## 2015-12-16 VITALS — BP 116/71 | HR 88 | Temp 98.3°F | Ht 67.0 in | Wt 215.2 lb

## 2015-12-16 DIAGNOSIS — H9202 Otalgia, left ear: Secondary | ICD-10-CM

## 2015-12-16 DIAGNOSIS — R195 Other fecal abnormalities: Secondary | ICD-10-CM | POA: Diagnosis not present

## 2015-12-16 DIAGNOSIS — R748 Abnormal levels of other serum enzymes: Secondary | ICD-10-CM

## 2015-12-16 DIAGNOSIS — H918X9 Other specified hearing loss, unspecified ear: Secondary | ICD-10-CM

## 2015-12-16 DIAGNOSIS — R1310 Dysphagia, unspecified: Secondary | ICD-10-CM

## 2015-12-16 DIAGNOSIS — H903 Sensorineural hearing loss, bilateral: Secondary | ICD-10-CM | POA: Diagnosis not present

## 2015-12-16 DIAGNOSIS — H608X2 Other otitis externa, left ear: Secondary | ICD-10-CM | POA: Diagnosis not present

## 2015-12-16 MED ORDER — HYOSCYAMINE SULFATE SL 0.125 MG SL SUBL: 1.0000 | SUBLINGUAL_TABLET | Freq: Four times a day (QID) | SUBLINGUAL | 0 refills | Status: DC

## 2015-12-16 MED ORDER — PEG 3350-KCL-NA BICARB-NACL 420 G PO SOLR
4000.0000 mL | ORAL | 0 refills | Status: DC
Start: 2015-12-16 — End: 2016-04-20

## 2015-12-16 NOTE — Patient Instructions (Signed)
TCS/EGD/Dil scheduled for 01/06/16. Advised pt to hold Eliquis after taking dose 01/03/16 if ok with Dr. Whitney Muse. Wrote in on instruction sheet after it was printed also. We will call her when we hear back from Dr. Whitney Muse.

## 2015-12-16 NOTE — Telephone Encounter (Signed)
Pt called to inform us that the new medication that we called in today her insurance will not cover. Please advise

## 2015-12-16 NOTE — Patient Instructions (Addendum)
We are scheduling you for a colonoscopy, upper endoscopy, and dilation with Dr. Gala Romney in the near future. We are checking with Hematology to make sure it is ok to stop the Eliquis for 48 hours prior to the procedure, then restarting it as soon as possible.   Please have blood work done today to recheck your liver numbers.   I sent in Levsin to try for abdominal cramping. Take this sparingly. Continue Linzess once each morning, 30 minutes before breakfast.

## 2015-12-16 NOTE — Progress Notes (Signed)
  Referring Provider: Roberson, Kristina, MD Primary Care Physician:  ROBERSON, KRISTINA, MD  Primary GI: Dr. Rourk   Chief Complaint  Patient presents with  . Follow-up    HPI:   Stephanie [name] is a 65 y.o. female presenting today with a history of fecal incontinence, heme positive stool. Low-volume hematochezia intermittently since Jan/Feb 2017. Last colonoscopy in the late 90s, early 2000s by [facility]. 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 Was hospitalized in May 2017 with diarrhea and found to have Cdiff antigen positive but negative toxin. She was treated empirically and diarrhea resolved. Outside labs with Alk phos 314, Cr 1.42, Albumin 1.9. While inpatient, H/H in 9-11 range. US abdomen in April 2017 with fatty liver, no biliary duct dilatation. Isolated elevation of alk phos noted. Normal transaminases. Ultimately needs TCS+/- EGD but will need to hold Eliquis if possible.   Isolated elevated alk phos in setting of fatty liver with AMA, ANA negative. ASMA moderately positive. Negative Hep B, C, and A. Repeat LFTs now with Alk Phos 336, AST 42, ALT 66. Alk Phos has increased.  Followed by Oncology with history of nephrotic syndrome, IgG kappa monoclonal gammopathy, observed for now.   No rectal bleeding. Notes lower abdominal, suprapubic discomfort for a string of days and then goes away. Feels tender in lower abdomen. Sometimes has chills but no fever. Sometimes worse after a BM and standing up. Linzess 145 mcg daily, no constipation. Denies any urinary burning. Notes urinary hesitancy at times. No foul odor.   Occasional solid food dysphagia. States she had an EGD in the 1990s by Dr. Rehman with a hiatal hernia.     Past Medical History:  Diagnosis Date  . Anemia   . Asthma   . COPD (chronic obstructive pulmonary disease) (HCC)   . Deep vein thrombosis (DVT) of both lower extremities (HCC) 06/27/2015  . Diabetes mellitus   . Fibromyalgia   . GERD (gastroesophageal reflux disease)   . H/O  hiatal hernia   . Hypertension   . Hyperthyroidism   . IBS (irritable bowel syndrome)   . Inner ear disease   . 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 PONV (postoperative nausea and vomiting)   . Tachycardia     Past Surgical History:  Procedure Laterality Date  . ABDOMINAL HYSTERECTOMY  partial  . CARPAL TUNNEL RELEASE Right 1991  . CATARACT EXTRACTION W/PHACO Right 05/08/2013   Procedure: CATARACT EXTRACTION PHACO AND INTRAOCULAR LENS PLACEMENT (IOC);  Surgeon: Kerry Hunt, MD;  Location: [facility];  Service: Ophthalmology;  Laterality: Right;  CDE 10.31  . CATARACT EXTRACTION W/PHACO Left 08/17/2013   Procedure: CATARACT EXTRACTION PHACO AND INTRAOCULAR LENS PLACEMENT (IOC);  Surgeon: Kerry Hunt, MD;  Location: [facility];  Service: Ophthalmology;  Laterality: Left;  CDE:9.03  . CHOLECYSTECTOMY    . DENTAL SURGERY    . WRIST GANGLION EXCISION Left     Current Outpatient Prescriptions  Medication Sig Dispense Refill  . albuterol (PROVENTIL HFA;VENTOLIN HFA) 108 (90 BASE) MCG/ACT inhaler Inhale 2 puffs into the lungs every 6 (six) hours as needed for wheezing or shortness of breath.    . albuterol (PROVENTIL) (2.5 MG/3ML) 0.083% nebulizer solution Take 2.5 mg by nebulization every 6 (six) hours as needed for wheezing or shortness of breath.    . apixaban (ELIQUIS) 5 MG TABS tablet Take 10 mg by mouth BID through Friday 5/19 evening. Take 5mg by mouth BID starting 5/20 AM. 60 tablet 3  . Bilberry, Vaccinium   myrtillus, 100 MG CAPS Take 1 capsule by mouth 2 (two) times daily.    . cetirizine (ZYRTEC) 10 MG tablet Take 10 mg by mouth daily.    . Cholecalciferol (VITAMIN D) 1000 UNITS capsule Take 2,000 Units by mouth 2 (two) times daily.     . clobetasol ointment (TEMOVATE) 4.09 % Apply 1 application topically 2 (two) times daily.     . Cyanocobalamin (VITAMIN B-12 PO) Take 1 tablet by mouth daily.      . DULoxetine (CYMBALTA) 30 MG capsule Take 60 mg by mouth 2 (two)  times daily.     Marland Kitchen HUMALOG KWIKPEN 100 UNIT/ML KiwkPen Inject 30 Units into the skin 3 (three) times daily.     Marland Kitchen HYDROcodone-acetaminophen (NORCO/VICODIN) 5-325 MG tablet Take 1 tablet by mouth every 4 (four) hours as needed. 15 tablet 0  . hydrOXYzine (ATARAX/VISTARIL) 25 MG tablet Take 25 mg by mouth every 8 (eight) hours as needed.     . insulin glargine (LANTUS) 100 UNIT/ML injection Inject 75 Units into the skin at bedtime.     Marland Kitchen levothyroxine (SYNTHROID, LEVOTHROID) 100 MCG tablet Take 100 mcg by mouth daily before breakfast.   0  . linaclotide (LINZESS) 145 MCG CAPS capsule Take 1 capsule (145 mcg total) by mouth daily before breakfast. 30 capsule 11  . linagliptin (TRADJENTA) 5 MG TABS tablet Take 5 mg by mouth daily.    Marland Kitchen LYRICA 75 MG capsule Take 75 mg by mouth 2 (two) times daily.     . metolazone (ZAROXOLYN) 5 MG tablet Take 1 tablet (5 mg total) by mouth 2 (two) times daily. 60 tablet 0  . metoprolol (LOPRESSOR) 100 MG tablet TAKE 1 TABLET BY MOUTH TWICE DAILY **TAKE WITH 25MG TABLET FOR TOTAL DOSAGE OF 125MG** 180 tablet 1  . mupirocin ointment (BACTROBAN) 2 % Apply 1 application topically 2 (two) times daily.     . pantoprazole (PROTONIX) 40 MG tablet Take 40 mg by mouth daily.     . pravastatin (PRAVACHOL) 40 MG tablet Take 40 mg by mouth daily.     Marland Kitchen PULMICORT FLEXHALER 180 MCG/ACT inhaler Inhale 2 puffs into the lungs daily as needed (for shortness of breath-wheezing).     . torsemide (DEMADEX) 20 MG tablet Take 2 tablets (40 mg total) by mouth daily. (Patient taking differently: Take 60 mg by mouth daily. ) 60 tablet 0  . traMADol (ULTRAM) 50 MG tablet Take 1 tablet (50 mg total) by mouth every 6 (six) hours as needed. 16 tablet 0  . vitamin C (ASCORBIC ACID) 500 MG tablet Take 1,000 mg by mouth daily.    . vitamin E 400 UNIT capsule Take 400 Units by mouth daily.    Marland Kitchen Hyoscyamine Sulfate SL (LEVSIN/SL) 0.125 MG SUBL Place 1 tablet under the tongue every 6 (six) hours. As  needed for cramping 120 each 0  . Melatonin 5 MG CAPS Take 2 capsules by mouth at bedtime.    . polyethylene glycol-electrolytes (TRILYTE) 420 g solution Take 4,000 mLs by mouth as directed. 4000 mL 0   No current facility-administered medications for this visit.     Allergies as of 12/16/2015 - Review Complete 11/04/2015  Allergen Reaction Noted  . Tetracyclines & related Anaphylaxis 09/19/2010  . Banana Hives and Nausea And Vomiting 05/04/2013  . Penicillins Rash 09/19/2010    Family History  Problem Relation Age of Onset  . Colon cancer Neg Hx     Social History   Social History  .  Marital status: Married    Spouse name: N/A  . Number of children: N/A  . Years of education: N/A   Social History Main Topics  . Smoking status: Former Smoker    Packs/day: 0.25    Years: 30.00    Types: Cigarettes    Quit date: 02/18/2015  . Smokeless tobacco: Never Used     Comment: some day smoker  . Alcohol use No  . Drug use: No  . Sexual activity: Yes    Birth control/ protection: Surgical   Other Topics Concern  . Not on file   Social History Narrative  . No narrative on file    Review of Systems: Gen: Denies fever, chills, anorexia. Denies fatigue, weakness, weight loss.  CV: Denies chest pain, palpitations, syncope, peripheral edema, and claudication. Resp: Denies dyspnea at rest, cough, wheezing, coughing up blood, and pleurisy. GI: see HPI  Derm: Denies rash, itching, dry skin Psych: Denies depression, anxiety, memory loss, confusion. No homicidal or suicidal ideation.  Heme: Denies bruising, bleeding, and enlarged lymph nodes.  Physical Exam: BP 116/71   Pulse 88   Temp 98.3 F (36.8 C)   Ht 5' 7" (1.702 m)   Wt 215 lb 3.2 oz (97.6 kg)   BMI 33.71 kg/m  General:   Alert and oriented. No distress noted. Pleasant and cooperative.  Head:  Normocephalic and atraumatic. Eyes:  Conjuctiva clear without scleral icterus. Mouth:  Oral mucosa pink and moist. Good  dentition. No lesions. Heart:  S1, S2 present without murmurs, rubs, or gallops. Regular rate and rhythm. Abdomen:  +BS, soft, very mild tenderness to palpation lower abdomen and non-distended. No rebound or guarding. No HSM or masses noted. Msk:  Symmetrical without gross deformities. Normal posture. Extremities:  Without edema. Neurologic:  Alert and  oriented x4;  grossly normal neurologically. Psych:  Alert and cooperative. Normal mood and affect.  Lab Results  Component Value Date   IRON 93 08/05/2015   TIBC 276 08/05/2015   FERRITIN 73 08/05/2015   Lab Results  Component Value Date   WBC 9.3 09/12/2015   HGB 10.2 (L) 09/12/2015   HCT 30.5 (L) 09/12/2015   MCV 77.0 (L) 09/12/2015   PLT 210 09/12/2015   Lab Results  Component Value Date   CREATININE 1.69 (H) 09/12/2015   BUN 43 (H) 09/12/2015   NA 133 (L) 09/12/2015   K 4.2 09/12/2015   CL 103 09/12/2015   CO2 25 09/12/2015     

## 2015-12-17 ENCOUNTER — Telehealth: Payer: Self-pay

## 2015-12-17 LAB — HEPATIC FUNCTION PANEL
ALBUMIN: 3.7 g/dL (ref 3.6–4.8)
ALK PHOS: 336 IU/L — AB (ref 39–117)
ALT: 66 IU/L — AB (ref 0–32)
AST: 42 IU/L — AB (ref 0–40)
BILIRUBIN TOTAL: 0.2 mg/dL (ref 0.0–1.2)
Bilirubin, Direct: 0.09 mg/dL (ref 0.00–0.40)
Total Protein: 8.1 g/dL (ref 6.0–8.5)

## 2015-12-17 NOTE — Telephone Encounter (Signed)
I have sent a request to [facility].

## 2015-12-17 NOTE — Telephone Encounter (Signed)
Routing to [place] per Gala Romney request: Is it ok to hold the patients eloquis for a colonoscopy?

## 2015-12-17 NOTE — Telephone Encounter (Signed)
Noted. How is abdominal discomfort now? Will likely need to request to hold Eliquis by Dr. Whitney Muse.

## 2015-12-17 NOTE — Telephone Encounter (Signed)
Pre-op appt 01/01/16 at 12:45pm. Called and informed pt. Letter mailed also.

## 2015-12-17 NOTE — Patient Instructions (Signed)
Hepatic Function Panel results placed on AB's desk.

## 2015-12-18 NOTE — Telephone Encounter (Signed)
Called the pt, she said her abd cramping happens off and on during the day. The levsin isnt covered by her insurance. I called [facility] and left a message to find out if it needs a PA or if it isnt covered at all.

## 2015-12-18 NOTE — Progress Notes (Signed)
Labs revealed: Alk Phos 336 AST 42 ALT 66

## 2015-12-19 NOTE — Assessment & Plan Note (Addendum)
65 year old female with heme positive stool noted several months ago but also diagnosed with DVT in May around same time, having to stay on Eliquis without therapy interruption for procedures. As she had no overt GI bleeding, Hgb stable and consistent with likely anemia of chronic disease, she has been closely followed and now will hopefully be able to hold Eliquis for 48 hours prior to colonoscopy. Last colonoscopy in the late 90s/2000s at [place], no FH of colon cancer. Mild abdominal cramping after bowel movement, question component of IBS. Feels like she is completely emptying so we will continue Linzess 145 mcg daily. Added Levsin to take sparingly, but this may not be covered by insurance. No concerning physical exam findings. Will ask Dr. Whitney Muse if we may hold anticoagulation for 48 hours prior, resuming as soon as possible after procedure.   Proceed with TCS with Dr. Gala Romney in near future: the risks, benefits, and alternatives have been discussed with the patient in detail. The patient states understanding and desires to proceed. PROPOFOL due to polypharmacy  HOLD ELIQUIS X 48 hours: appropriate per hematology.

## 2015-12-19 NOTE — Assessment & Plan Note (Signed)
Most recently in the 300 range, with AST 42, ALT 66. Known fatty liver on [place] in April 2017, no evidence of biliary duct dilatation. GGT elevated but could also be elevated in other chronic diseases; she has multiple co-morbidities and followed by oncology with history of nephrotic syndrome, IgG kappa monoclonal gammopathy. Thus far, Hep A, B, C negative, normal AMA and ANA. Moderately positive ASMA, non-specific. As she has had persistently elevated alk phos, may need further imaging vs considering liver biopsy to sort out. Will need to discuss with Dr. Gala Romney further.

## 2015-12-19 NOTE — Assessment & Plan Note (Addendum)
Notes intermittent solid food dysphagia. Reports an EGD in the 1990s by Dr. Laural Golden; we do not have these reports. As we are pursuing a colonoscopy, will also pursue an upper endoscopy with dilation.  Proceed with upper endoscopy/dilation in the near future with Dr. Gala Romney. The risks, benefits, and alternatives have been discussed in detail with patient. They have stated understanding and desire to proceed.  PROPOFOL due to polypharmacy Requesting to hold Eliquis X 48 hours

## 2015-12-19 NOTE — Progress Notes (Signed)
cc'ed to pcp °

## 2015-12-20 NOTE — Telephone Encounter (Signed)
Did we find out from [place] anything?

## 2015-12-24 ENCOUNTER — Encounter (HOSPITAL_COMMUNITY): Payer: Medicare Other | Attending: Oncology | Admitting: Oncology

## 2015-12-24 ENCOUNTER — Other Ambulatory Visit: Payer: Self-pay

## 2015-12-24 ENCOUNTER — Encounter (HOSPITAL_COMMUNITY): Payer: Medicare Other | Attending: Hematology & Oncology

## 2015-12-24 ENCOUNTER — Encounter (HOSPITAL_COMMUNITY): Payer: Self-pay | Admitting: Oncology

## 2015-12-24 VITALS — BP 118/95 | HR 94 | Temp 98.4°F | Resp 16 | Ht 67.5 in | Wt 219.0 lb

## 2015-12-24 DIAGNOSIS — Z23 Encounter for immunization: Secondary | ICD-10-CM | POA: Diagnosis not present

## 2015-12-24 DIAGNOSIS — D472 Monoclonal gammopathy: Secondary | ICD-10-CM | POA: Diagnosis not present

## 2015-12-24 DIAGNOSIS — R109 Unspecified abdominal pain: Secondary | ICD-10-CM

## 2015-12-24 DIAGNOSIS — D649 Anemia, unspecified: Secondary | ICD-10-CM | POA: Diagnosis not present

## 2015-12-24 LAB — IRON AND TIBC
IRON: 57 ug/dL (ref 28–170)
SATURATION RATIOS: 17 % (ref 10.4–31.8)
TIBC: 332 ug/dL (ref 250–450)
UIBC: 275 ug/dL

## 2015-12-24 LAB — CBC WITH DIFFERENTIAL/PLATELET
Basophils Absolute: 0 10*3/uL (ref 0.0–0.1)
Basophils Relative: 0 %
Eosinophils Absolute: 0.8 10*3/uL — ABNORMAL HIGH (ref 0.0–0.7)
Eosinophils Relative: 6 %
HEMATOCRIT: 34.9 % — AB (ref 36.0–46.0)
HEMOGLOBIN: 11.1 g/dL — AB (ref 12.0–15.0)
LYMPHS ABS: 5.3 10*3/uL — AB (ref 0.7–4.0)
Lymphocytes Relative: 36 %
MCH: 25.6 pg — AB (ref 26.0–34.0)
MCHC: 31.8 g/dL (ref 30.0–36.0)
MCV: 80.6 fL (ref 78.0–100.0)
MONO ABS: 1 10*3/uL (ref 0.1–1.0)
MONOS PCT: 7 %
NEUTROS ABS: 7.6 10*3/uL (ref 1.7–7.7)
NEUTROS PCT: 51 %
Platelets: 259 10*3/uL (ref 150–400)
RBC: 4.33 MIL/uL (ref 3.87–5.11)
RDW: 14.9 % (ref 11.5–15.5)
WBC: 14.7 10*3/uL — ABNORMAL HIGH (ref 4.0–10.5)

## 2015-12-24 LAB — COMPREHENSIVE METABOLIC PANEL
ALK PHOS: 306 U/L — AB (ref 38–126)
ALT: 58 U/L — ABNORMAL HIGH (ref 14–54)
ANION GAP: 8 (ref 5–15)
AST: 41 U/L (ref 15–41)
Albumin: 3.2 g/dL — ABNORMAL LOW (ref 3.5–5.0)
BILIRUBIN TOTAL: 0.4 mg/dL (ref 0.3–1.2)
BUN: 38 mg/dL — ABNORMAL HIGH (ref 6–20)
CALCIUM: 9 mg/dL (ref 8.9–10.3)
CO2: 25 mmol/L (ref 22–32)
Chloride: 101 mmol/L (ref 101–111)
Creatinine, Ser: 2.24 mg/dL — ABNORMAL HIGH (ref 0.44–1.00)
GFR, EST AFRICAN AMERICAN: 25 mL/min — AB (ref >60)
GFR, EST NON AFRICAN AMERICAN: 22 mL/min — AB (ref >60)
Glucose, Bld: 276 mg/dL — ABNORMAL HIGH (ref 65–99)
Potassium: 3.9 mmol/L (ref 3.5–5.1)
Sodium: 134 mmol/L — ABNORMAL LOW (ref 135–145)
TOTAL PROTEIN: 8.2 g/dL — AB (ref 6.5–8.1)

## 2015-12-24 LAB — FERRITIN: FERRITIN: 62 ng/mL (ref 11–307)

## 2015-12-24 MED ORDER — INFLUENZA VAC SPLIT QUAD 0.5 ML IM SUSY
0.5000 mL | PREFILLED_SYRINGE | Freq: Once | INTRAMUSCULAR | Status: AC
  Administered 2015-12-24: 0.5 mL via INTRAMUSCULAR
  Filled 2015-12-24: qty 0.5

## 2015-12-24 NOTE — Telephone Encounter (Signed)
Called [facility] again and had to leave a message again. Asked them to call me back. Stephanie [name], pt saw Kirby Crigler today in their office.

## 2015-12-24 NOTE — Telephone Encounter (Signed)
Pt is aware. She said it was ok to set up CT- on Wed- she can go in the evening, Thurs- she can go in the morning and Friday- she can go in the evening.

## 2015-12-24 NOTE — Progress Notes (Signed)
Stephanie Simmer, MD [address] [place] [address]  MGUS (monoclonal gammopathy of unknown significance) - Plan: Iron and TIBC, Ferritin, CBC with Differential, Comprehensive metabolic panel, Kappa/lambda light chains, IgG, IgA, IgM, Immunofixation electrophoresis, Protein electrophoresis, serum, mometasone (ELOCON) 0.1 % cream, prednisoLONE acetate (PRED FORTE) 1 % ophthalmic suspension, Renal function panel  Need for prophylactic vaccination and inoculation against influenza - Plan: Influenza vac split quadrivalent PF (FLUARIX) injection 0.5 mL  CURRENT THERAPY: Observation  INTERVAL HISTORY: Stephanie [name] 65 y.o. female returns for followup of MGUS, IgG kappa monoclonal gammopathy, with bone marrow aspiration and biopsy on 09/03/2015 demonstrating 9% plasma cells and negative skeletal survey on 08/12/2015.  She reports muscle spasms in multiple muscle groups.  She denies any trauma or changes in activities.  She denies any new medications that are concerning for muscle cramps.  She notes that they come and go.  They do awake her at night sometimes.  She does have fibromyalgia.    She reports follow-up with nephrology in Jan 2018.  She is provided education regarding MGUS.  I am concerned about her renal function change.    She is seeing ENT for hearing loss.  She notes an upcoming MRI to further evaluate this issue tomorrow.  Review of Systems  Constitutional: Positive for malaise/fatigue. Negative for chills, fever and weight loss.  HENT: Positive for hearing loss.   Eyes: Negative.  Negative for double vision.  Respiratory: Negative.  Negative for cough.   Cardiovascular: Negative.  Negative for chest pain.  Gastrointestinal: Positive for constipation and diarrhea. Negative for blood in stool, melena, nausea and vomiting.  Genitourinary: Negative.   Musculoskeletal: Negative.   Skin: Positive for itching and rash.  Neurological: Negative.  Negative for  weakness.  Endo/Heme/Allergies: Negative.   Psychiatric/Behavioral: Negative.     Past Medical History:  Diagnosis Date  . Anemia   . Asthma   . COPD (chronic obstructive pulmonary disease) ([place])   . Deep vein thrombosis (DVT) of both lower extremities ([place]) 06/27/2015  . Diabetes mellitus   . Fibromyalgia   . GERD (gastroesophageal reflux disease)   . H/O hiatal hernia   . Hypertension   . Hyperthyroidism   . IBS (irritable bowel syndrome)   . Inner ear disease   . 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 PONV (postoperative nausea and vomiting)   . Tachycardia     Past Surgical History:  Procedure Laterality Date  . ABDOMINAL HYSTERECTOMY  partial  . CARPAL TUNNEL RELEASE Right 1991  . CATARACT EXTRACTION W/PHACO Right 05/08/2013   Procedure: CATARACT EXTRACTION PHACO AND INTRAOCULAR LENS PLACEMENT (IOC);  Surgeon: Tonny Branch, MD;  Location: [facility];  Service: Ophthalmology;  Laterality: Right;  CDE 10.31  . CATARACT EXTRACTION W/PHACO Left 08/17/2013   Procedure: CATARACT EXTRACTION PHACO AND INTRAOCULAR LENS PLACEMENT (IOC);  Surgeon: Tonny Branch, MD;  Location: [facility];  Service: Ophthalmology;  Laterality: Left;  CDE:9.03  . CHOLECYSTECTOMY    . DENTAL SURGERY    . WRIST GANGLION EXCISION Left     Family History  Problem Relation Age of Onset  . Colon cancer Neg Hx     Social History   Social History  . Marital status: Married    Spouse name: N/A  . Number of children: N/A  . Years of education: N/A   Social History Main Topics  . Smoking status: Former Smoker    Packs/day: 0.25  Years: 30.00    Types: Cigarettes    Quit date: 02/18/2015  . Smokeless tobacco: Never Used     Comment: some day smoker  . Alcohol use No  . Drug use: No  . Sexual activity: Yes    Birth control/ protection: Surgical   Other Topics Concern  . None   Social History Narrative  . None     PHYSICAL EXAMINATION  ECOG PERFORMANCE STATUS: 2 -  Symptomatic, <50% confined to bed  Vitals:   12/24/15 0929  BP: (!) 118/95  Pulse: 94  Resp: 16  Temp: 98.4 F (36.9 C)    GENERAL:alert, no distress, well nourished, well developed, comfortable, cooperative, obese, smiling and unaccompanied, chronically ill appearing. SKIN: skin color, texture, turgor are normal, positive for: diffuse, small macular lesions that are not erythematous, well-demarcated, hyperpigmented. HEAD: Normocephalic, No masses, lesions, tenderness or abnormalities EYES: normal, EOMI, Conjunctiva are pink and non-injected EARS: External ears normal OROPHARYNX:lips, buccal mucosa, and tongue normal and mucous membranes are moist  NECK: supple, trachea midline LYMPH:  no palpable lymphadenopathy BREAST:not examined LUNGS: clear to auscultation  HEART: regular rate & rhythm ABDOMEN:non-tender, obese and normal bowel sounds BACK: Back symmetric, no curvature. EXTREMITIES:less then 2 second capillary refill, no joint deformities, effusion, or inflammation, no skin discoloration, no cyanosis, positive findings:  edema B/L LE edema, 1+ pitting  NEURO: alert & oriented x 3 with fluent speech, no focal motor/sensory deficits, gait normal with a cane   LABORATORY DATA: CBC    Component Value Date/Time   WBC 14.7 (H) 12/24/2015 0842   RBC 4.33 12/24/2015 0842   HGB 11.1 (L) 12/24/2015 0842   HCT 34.9 (L) 12/24/2015 0842   PLT 259 12/24/2015 0842   MCV 80.6 12/24/2015 0842   MCH 25.6 (L) 12/24/2015 0842   MCHC 31.8 12/24/2015 0842   RDW 14.9 12/24/2015 0842   LYMPHSABS 5.3 (H) 12/24/2015 0842   MONOABS 1.0 12/24/2015 0842   EOSABS 0.8 (H) 12/24/2015 0842   BASOSABS 0.0 12/24/2015 0842      Chemistry      Component Value Date/Time   NA 134 (L) 12/24/2015 0842   K 3.9 12/24/2015 0842   CL 101 12/24/2015 0842   CO2 25 12/24/2015 0842   BUN 38 (H) 12/24/2015 0842   CREATININE 2.24 (H) 12/24/2015 0842      Component Value Date/Time   CALCIUM 9.0 12/24/2015  0842   ALKPHOS 306 (H) 12/24/2015 0842   AST 41 12/24/2015 0842   ALT 58 (H) 12/24/2015 0842   BILITOT 0.4 12/24/2015 0842   BILITOT 0.2 12/16/2015 1625     Lab Results  Component Value Date   PROT 8.2 (H) 12/24/2015   ALBUMINELP 1.2 (L) 06/27/2015   A1GS 0.2 06/27/2015   A2GS 1.7 (H) 06/27/2015   BETS 2.5 (H) 06/27/2015   GAMS 0.5 06/27/2015   MSPIKE 0.4 (H) 06/27/2015   SPEI Comment 06/27/2015   SPECOM Comment 06/27/2015   IGGSERUM 2,754 (H) 08/05/2015   IGA 546 (H) 08/05/2015   IGMSERUM 49 08/05/2015   KPAFRELGTCHN 312.0 (H) 08/05/2015   LAMBDASER 54.6 (H) 08/05/2015   KAPLAMBRATIO 5.71 (H) 08/05/2015     PENDING LABS:   RADIOGRAPHIC STUDIES:  No results found.   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  She has a history  of B/L LE DVT and is on anticoagulation with Eliquis, nephrotic syndrome 9g/24 hrs proteinuria ? etiology.  Labs today: CBC diff, CMET, SPEP+IFE, light chain assay.  I personally reviewed and went over laboratory results with the patient.  The results are noted within this dictation.  Given her microcytic anemia on past labs, I will add iron studies to her lab work today.  Change in renal function is noted along with minimally elevated protein level.  I will send a copy of her lab work to her nephrologist, Dr. Theador Hawthorne.  She notes an appointment with her nephrologist in Jan 2018.  Labs in 2 weeks: renal function panel.  Labs in 8 weeks: CBC diff, CMET, SPEP+IFE, light chain assay.  Monoclonal gammopathy of undetermined significance (MGUS) is diagnosed in persons who meet the following three criteria:  ?Serum monoclonal protein (whether IgA, IgG, or IgM) <3 g/dL  ?Clonal bone marrow plasma cells <10 percent  ?Absence of lytic lesions,  anemia, hypercalcemia, and renal insufficiency (end-organ damage) that can be attributed to the plasma cell proliferative disorder MGUS carries a risk of progression to MM of approximately 1 percent per year. Differentiation of MGUS from MM can be difficult and is primarily based on presence or absence of related end-organ damage. In comparison to overt MM or plasma cell leukemia, most patients with MGUS or SMM have few or no circulating monoclonal plasma cells.   MGUS is defined as a serum monoclonal protein (M-Protein) at a concentration of <3 g/dL, a bone marrow with < 10% monoclonal plasma cells, and absence of end-organ damage (lytic bone lesions, anemia, hypercalcemia, renal insufficiency, hyperviscosity) related to the proliferative process.  Non-IgM MGUS (IgG, IgA, or IgD MGUS is the most common subtype of MGUS and has the potential to progress to smoldering (asymptomatic) multiple myeloma and to symptomatic multiple myeloma.  Less frequently, these patients progress to AL amyloidosis, light chain deposition disease, or another lymphoproliferative disorder.  IgM MGUS accounts for approximately 15% of MGUS cases.  It is considered separately from non-IgM MGUS because it has the potential to progress to smoldering Waldenstrom macroglobinemia and to symptomatic Waldenstrom macroglobulinemia and less often to lymphoma or AL amyloidosis.  Infrequently, IgM MGUS can progress to IgM multiple myeloma.Light chain MGUS (LC-MGUS) is a unique subtype of MGUS in which the secreted monoclonal protein lacks the immunoglobulin heavy chain component. LC-MGUS may progress to idiopathic Bence Jones proteinemia and to light chain multiple myeloma, AL amyloidosis, or light chain deposition disease.  Influenza immunization administered today in the clinic.  Return in 8 weeks for follow-up.   ORDERS PLACED FOR THIS ENCOUNTER: Orders Placed This Encounter  Procedures  . Iron and TIBC  . Ferritin  . CBC with  Differential  . Comprehensive metabolic panel  . Kappa/lambda light chains  . IgG, IgA, IgM  . Immunofixation electrophoresis  . Protein electrophoresis, serum  . Renal function panel    MEDICATIONS PRESCRIBED THIS ENCOUNTER: Meds ordered this encounter  Medications  . mometasone (ELOCON) 0.1 % cream  . prednisoLONE acetate (PRED FORTE) 1 % ophthalmic suspension  . Influenza vac split quadrivalent PF (FLUARIX) injection 0.5 mL    THERAPY PLAN:  Will monitor for changes associated with MGUS to multiple myeloma transformation/progression.   C= Calcium increase of greater than 11.5 mg/dL R = Renal insufficiency with creatinine > 2 mg/dL A= Anemia with Hgb < 10 g/dL B= Bone disease with lytic lesions and/or osteopenia   All questions were answered. The patient knows to call  the clinic with any problems, questions or concerns. We can certainly see the patient much sooner if necessary.  Patient and plan discussed with Dr. Ancil Linsey and she is in agreement with the aforementioned.   This note is electronically signed by: Doy Mince 12/24/2015 9:56 AM

## 2015-12-24 NOTE — Progress Notes (Signed)
Pt given flu shot in left deltoid. Pt tolerated well. Pt stable and discharged home ambulatory.

## 2015-12-24 NOTE — Assessment & Plan Note (Addendum)
MGUS, IgG kappa monoclonal gammopathy, with bone marrow aspiration and biopsy on 09/03/2015 demonstrating 9% plasma cells and negative skeletal survey on 08/12/2015.  She has a history of B/L LE DVT and is on anticoagulation with Eliquis, nephrotic syndrome 9g/24 hrs proteinuria ? etiology.  Labs today: CBC diff, CMET, SPEP+IFE, light chain assay.  I personally reviewed and went over laboratory results with the patient.  The results are noted within this dictation.  Given her microcytic anemia on past labs, I will add iron studies to her lab work today.  Change in renal function is noted along with minimally elevated protein level.  I will send a copy of her lab work to her nephrologist, Dr. Theador Hawthorne.  She notes an appointment with her nephrologist in Jan 2018.  Labs in 2 weeks: renal function panel.  Labs in 8 weeks: CBC diff, CMET, SPEP+IFE, light chain assay.  Monoclonal gammopathy of undetermined significance (MGUS) is diagnosed in persons who meet the following three criteria:  ?Serum monoclonal protein (whether IgA, IgG, or IgM) <3 g/dL  ?Clonal bone marrow plasma cells <10 percent  ?Absence of lytic lesions, anemia, hypercalcemia, and renal insufficiency (end-organ damage) that can be attributed to the plasma cell proliferative disorder MGUS carries a risk of progression to MM of approximately 1 percent per year. Differentiation of MGUS from MM can be difficult and is primarily based on presence or absence of related end-organ damage. In comparison to overt MM or plasma cell leukemia, most patients with MGUS or SMM have few or no circulating monoclonal plasma cells.   MGUS is defined as a serum monoclonal protein (M-Protein) at a concentration of <3 g/dL, a bone marrow with < 10% monoclonal plasma cells, and absence of end-organ damage (lytic bone lesions, anemia, hypercalcemia, renal insufficiency, hyperviscosity) related to the proliferative process.  Non-IgM MGUS (IgG, IgA, or IgD MGUS is the  most common subtype of MGUS and has the potential to progress to smoldering (asymptomatic) multiple myeloma and to symptomatic multiple myeloma.  Less frequently, these patients progress to AL amyloidosis, light chain deposition disease, or another lymphoproliferative disorder.  IgM MGUS accounts for approximately 15% of MGUS cases.  It is considered separately from non-IgM MGUS because it has the potential to progress to smoldering Waldenstrom macroglobinemia and to symptomatic Waldenstrom macroglobulinemia and less often to lymphoma or AL amyloidosis.  Infrequently, IgM MGUS can progress to IgM multiple myeloma.Light chain MGUS (LC-MGUS) is a unique subtype of MGUS in which the secreted monoclonal protein lacks the immunoglobulin heavy chain component. LC-MGUS may progress to idiopathic Bence Jones proteinemia and to light chain multiple myeloma, AL amyloidosis, or light chain deposition disease.  Influenza immunization administered today in the clinic.  Return in 8 weeks for follow-up.

## 2015-12-24 NOTE — Patient Instructions (Signed)
[place] at [facility] Discharge Instructions  RECOMMENDATIONS MADE BY THE CONSULTANT AND ANY TEST RESULTS WILL BE SENT TO YOUR REFERRING PHYSICIAN.  You were seen today by Stephanie Crigler PA-C. Flu shot given today. Return in 2 weeks for labs. Return in 8 weeks for labs and follow up.   Thank you for choosing [place] at [facility] to provide your oncology and hematology care.  To afford each patient quality time with our provider, please arrive at least 15 minutes before your scheduled appointment time.   Beginning January 23rd 2017 lab work for the [facility] will be done in the  Main lab at [facility] on 1st floor. If you have a lab appointment with the [facility] please come in thru the  Main Entrance and check in at the main information desk  You need to re-schedule your appointment should you arrive 10 or more minutes late.  We strive to give you quality time with our providers, and arriving late affects you and other patients whose appointments are after yours.  Also, if you no show three or more times for appointments you may be dismissed from the clinic at the providers discretion.     Again, thank you for choosing [facility].  Our hope is that these requests will decrease the amount of time that you wait before being seen by our physicians.       _____________________________________________________________  Should you have questions after your visit to [facility] please contact our office at [phone] between the hours of 8:30 a.m. and 4:30 p.m.  Voicemails left after 4:30 p.m. will not be returned until the following business day.  For prescription refill requests, have your pharmacy contact our office.         Resources For Cancer Patients and their Caregivers ? American Cancer Society: Can assist with transportation, wigs, general needs, runs Look Good Feel Better.         ([phone] [phone] ? Cancer Care: Provides financial assistance, online support groups, medication/co-pay assistance.  1-800-813-HOPE 224-848-9196) ? [place] Assists [place] cancer patients and their families through emotional , educational and financial support.  715-397-4951 ? [place] DSS Where to apply for food stamps, Medicaid and utility assistance. 931-005-8919 ? [facility]: Transportation to medical appointments. 223-141-5167 ? Social Security Administration: May apply for disability if have a Stage IV cancer. 832-655-8621 5730883867 ? [employer], Disability and Transit Services: Assists with nutrition, care and transit needs. [place] Support Programs: @10RELATIVEDAYS @ > Cancer Support Group  2nd Tuesday of the month 1pm-2pm, Journey Room  > Creative Journey  3rd Tuesday of the month 1130am-1pm, Journey Room  > Look Good Feel Better  1st Wednesday of the month 10am-12 noon, Journey Room (Call [place] to register [phone])

## 2015-12-24 NOTE — Telephone Encounter (Signed)
Called pt and informed of CT Abd/pelvis scheduled for 12/26/15 at 9:30am at [facility] arrive 9:15am. Pick-up contrast before test, no solid foods 4 hours prior.

## 2015-12-24 NOTE — Telephone Encounter (Signed)
If patient is having persistent abdominal discomfort, would recommend CT without contrast (due to renal function). In looking back, she has a history of diverticulitis. As this is persistent and we do not have recent imaging, let's proceed with it this week.

## 2015-12-24 NOTE — Telephone Encounter (Signed)
May hold Eliquis 48 hours prior to procedures. I cleared with Hematology.

## 2015-12-25 ENCOUNTER — Ambulatory Visit (HOSPITAL_COMMUNITY)
Admission: RE | Admit: 2015-12-25 | Discharge: 2015-12-25 | Disposition: A | Discharge status: Home / Self Care | Payer: Medicare Other | Source: Ambulatory Visit | Attending: Otolaryngology | Admitting: Otolaryngology

## 2015-12-25 ENCOUNTER — Encounter (HOSPITAL_COMMUNITY): Payer: Self-pay

## 2015-12-25 DIAGNOSIS — H9041 Sensorineural hearing loss, unilateral, right ear, with unrestricted hearing on the contralateral side: Secondary | ICD-10-CM | POA: Diagnosis not present

## 2015-12-25 DIAGNOSIS — H918X9 Other specified hearing loss, unspecified ear: Secondary | ICD-10-CM

## 2015-12-25 LAB — PROTEIN ELECTROPHORESIS, SERUM
A/G Ratio: 0.7 (ref 0.7–1.7)
Albumin ELP: 3.1 g/dL (ref 2.9–4.4)
Alpha-1-Globulin: 0.2 g/dL (ref 0.0–0.4)
Alpha-2-Globulin: 1 g/dL (ref 0.4–1.0)
Beta Globulin: 1.4 g/dL — ABNORMAL HIGH (ref 0.7–1.3)
GAMMA GLOBULIN: 2 g/dL — AB (ref 0.4–1.8)
Globulin, Total: 4.6 g/dL — ABNORMAL HIGH (ref 2.2–3.9)
M-SPIKE, %: 1.3 g/dL — AB
TOTAL PROTEIN ELP: 7.7 g/dL (ref 6.0–8.5)

## 2015-12-25 LAB — KAPPA/LAMBDA LIGHT CHAINS
KAPPA FREE LGHT CHN: 495 mg/L — AB (ref 3.3–19.4)
KAPPA, LAMDA LIGHT CHAIN RATIO: 17.13 — AB (ref 0.26–1.65)
LAMDA FREE LIGHT CHAINS: 28.9 mg/L — AB (ref 5.7–26.3)

## 2015-12-26 ENCOUNTER — Ambulatory Visit (HOSPITAL_COMMUNITY)
Admission: RE | Admit: 2015-12-26 | Discharge: 2015-12-26 | Disposition: A | Discharge status: Home / Self Care | Payer: Medicare Other | Source: Ambulatory Visit | Attending: Gastroenterology | Admitting: Gastroenterology

## 2015-12-26 DIAGNOSIS — M4316 Spondylolisthesis, lumbar region: Secondary | ICD-10-CM | POA: Insufficient documentation

## 2015-12-26 DIAGNOSIS — R109 Unspecified abdominal pain: Secondary | ICD-10-CM | POA: Insufficient documentation

## 2015-12-26 DIAGNOSIS — K5730 Diverticulosis of large intestine without perforation or abscess without bleeding: Secondary | ICD-10-CM | POA: Diagnosis not present

## 2015-12-26 DIAGNOSIS — R1030 Lower abdominal pain, unspecified: Secondary | ICD-10-CM | POA: Diagnosis not present

## 2015-12-26 DIAGNOSIS — M5126 Other intervertebral disc displacement, lumbar region: Secondary | ICD-10-CM | POA: Diagnosis not present

## 2015-12-26 DIAGNOSIS — I708 Atherosclerosis of other arteries: Secondary | ICD-10-CM | POA: Diagnosis not present

## 2015-12-26 DIAGNOSIS — K319 Disease of stomach and duodenum, unspecified: Secondary | ICD-10-CM | POA: Insufficient documentation

## 2015-12-26 DIAGNOSIS — M48061 Spinal stenosis, lumbar region without neurogenic claudication: Secondary | ICD-10-CM | POA: Diagnosis not present

## 2015-12-26 LAB — IMMUNOFIXATION ELECTROPHORESIS
IGM, SERUM: 35 mg/dL (ref 26–217)
IgA: 256 mg/dL (ref 87–352)
IgG (Immunoglobin G), Serum: 2185 mg/dL — ABNORMAL HIGH (ref 700–1600)
TOTAL PROTEIN ELP: 7.6 g/dL (ref 6.0–8.5)

## 2015-12-29 ENCOUNTER — Other Ambulatory Visit (HOSPITAL_COMMUNITY): Payer: Self-pay | Admitting: Oncology

## 2015-12-30 ENCOUNTER — Other Ambulatory Visit (HOSPITAL_COMMUNITY): Payer: Self-pay | Admitting: *Deleted

## 2015-12-30 DIAGNOSIS — D649 Anemia, unspecified: Secondary | ICD-10-CM

## 2015-12-30 NOTE — Progress Notes (Signed)
Please let patient know that there is no evidence of acute findings on CT that could explain lower abdominal discomfort. I am curious to see what the colonoscopy/EGD shows. There is evidence of possible localized gastritis on CT scan, so an EGD will help sort that out further.

## 2015-12-31 NOTE — Patient Instructions (Signed)
Stephanie [name]  12/31/2015     @PREFPERIOPPHARMACY @   Your procedure is scheduled on  01/06/2016   Report to [facility] at  1100  A.M.  Call this number if you have problems the morning of surgery:  [phone]   Remember:  Do not eat food or drink liquids after midnight.  Take these medicines the morning of surgery with A SIP OF WATER  Protonix, levothyroxine, cymbalta, lyrica. Take your inhalers before you come and bring your rescue inhaler with you. Take1/2 of your usual Levemir dosage the night before your procedure. DO NOT take any medications for diabetes the morning of your procedure.   Do not wear jewelry, make-up or nail polish.  Do not wear lotions, powders, or perfumes, or deoderant.  Do not shave 48 hours prior to surgery.  Men may shave face and neck.  Do not bring valuables to the hospital.  [facility] is not responsible for any belongings or valuables.  Contacts, dentures or bridgework may not be worn into surgery.  Leave your suitcase in the car.  After surgery it may be brought to your room.  For patients admitted to the hospital, discharge time will be determined by your treatment team.  Patients discharged the day of surgery will not be allowed to drive home.   Name and phone number of your driver:   Family   Special instructions:  Follow the diet and prep instructions given to you by Dr Roseanne Kaufman office.  Please read over the following fact sheets that you were given. Anesthesia Post-op Instructions and Care and Recovery After Surgery       Esophagogastroduodenoscopy Introduction Esophagogastroduodenoscopy (EGD) is a procedure to examine the lining of the esophagus, stomach, and first part of the small intestine (duodenum). This procedure is done to check for problems such as inflammation, bleeding, ulcers, or growths. During this procedure, a long, flexible, lighted tube with a camera attached (endoscope) is inserted down the  throat. Tell a health care provider about:  Any allergies you have.  All medicines you are taking, including vitamins, herbs, eye drops, creams, and over-the-counter medicines.  Any problems you or family members have had with anesthetic medicines.  Any blood disorders you have.  Any surgeries you have had.  Any medical conditions you have.  Whether you are pregnant or may be pregnant. What are the risks? Generally, this is a safe procedure. However, problems may occur, including:  Infection.  Bleeding.  A tear (perforation) in the esophagus, stomach, or duodenum.  Trouble breathing.  Excessive sweating.  Spasms of the larynx.  A slowed heartbeat.  Low blood pressure. What happens before the procedure?  Follow instructions from your health care provider about eating or drinking restrictions.  Ask your health care provider about:  Changing or stopping your regular medicines. This is especially important if you are taking diabetes medicines or blood thinners.  Taking medicines such as aspirin and ibuprofen. These medicines can thin your blood. Do not take these medicines before your procedure if your health care provider instructs you not to.  Plan to have someone take you home after the procedure.  If you wear dentures, be ready to remove them before the procedure. What happens during the procedure?  To reduce your risk of infection, your health care team will wash or sanitize their hands.  An IV tube will be put in a vein in  your hand or arm. You will get medicines and fluids through this tube.  You will be given one or more of the following:  A medicine to help you relax (sedative).  A medicine to numb the area (local anesthetic). This medicine may be sprayed into your throat. It will make you feel more comfortable and keep you from gagging or coughing during the procedure.  A medicine for pain.  A mouth guard may be placed in your mouth to protect your  teeth and to keep you from biting on the endoscope.  You will be asked to lie on your left side.  The endoscope will be lowered down your throat into your esophagus, stomach, and duodenum.  Air will be put into the endoscope. This will help your health care provider see better.  The lining of your esophagus, stomach, and duodenum will be examined.  Your health care provider may:  Take a tissue sample so it can be looked at in a lab (biopsy).  Remove growths.  Remove objects (foreign bodies) that are stuck.  Treat any bleeding with medicines or other devices that stop tissue from bleeding.  Widen (dilate) or stretch narrowed areas of your esophagus and stomach.  The endoscope will be taken out. The procedure may vary among health care providers and hospitals. What happens after the procedure?  Your blood pressure, heart rate, breathing rate, and blood oxygen level will be monitored often until the medicines you were given have worn off.  Do not eat or drink anything until the numbing medicine has worn off and your gag reflex has returned. This information is not intended to replace advice given to you by your health care provider. Make sure you discuss any questions you have with your health care provider. Document Released: 06/05/2004 Document Revised: 07/11/2015 Document Reviewed: 12/27/2014  2017 Elsevier Esophagogastroduodenoscopy, Care After Introduction Refer to this sheet in the next few weeks. These instructions provide you with information about caring for yourself after your procedure. Your health care provider may also give you more specific instructions. Your treatment has been planned according to current medical practices, but problems sometimes occur. Call your health care provider if you have any problems or questions after your procedure. What can I expect after the procedure? After the procedure, it is common to have:  A sore  throat.  Nausea.  Bloating.  Dizziness.  Fatigue. Follow these instructions at home:  Do not eat or drink anything until the numbing medicine (local anesthetic) has worn off and your gag reflex has returned. You will know that the local anesthetic has worn off when you can swallow comfortably.  Do not drive for 24 hours if you received a medicine to help you relax (sedative).  If your health care provider took a tissue sample for testing during the procedure, make sure to get your test results. This is your responsibility. Ask your health care provider or the department performing the test when your results will be ready.  Keep all follow-up visits as told by your health care provider. This is important. Contact a health care provider if:  You cannot stop coughing.  You are not urinating.  You are urinating less than usual. Get help right away if:  You have trouble swallowing.  You cannot eat or drink.  You have throat or chest pain that gets worse.  You are dizzy or light-headed.  You faint.  You have nausea or vomiting.  You have chills.  You have a fever.  You have severe abdominal pain.  You have black, tarry, or bloody stools. This information is not intended to replace advice given to you by your health care provider. Make sure you discuss any questions you have with your health care provider. Document Released: 01/20/2012 Document Revised: 07/11/2015 Document Reviewed: 12/27/2014  2017 Elsevier  Esophageal Dilatation Esophageal dilatation is a procedure to open a blocked or narrowed part of the esophagus. The esophagus is the long tube in your throat that carries food and liquid from your mouth to your stomach. The procedure is also called esophageal dilation. You may need this procedure if you have a buildup of scar tissue in your esophagus that makes it difficult, painful, or even impossible to swallow. This can be caused by gastroesophageal reflux  disease (GERD). In rare cases, people need this procedure because they have cancer of the esophagus or a problem with the way food moves through the esophagus. Sometimes you may need to have another dilatation to enlarge the opening of the esophagus gradually. Tell a health care provider about:  Any allergies you have.  All medicines you are taking, including vitamins, herbs, eye drops, creams, and over-the-counter medicines.  Any problems you or family members have had with anesthetic medicines.  Any blood disorders you have.  Any surgeries you have had.  Any medical conditions you have.  Any antibiotic medicines you are required to take before dental procedures. What are the risks? Generally, this is a safe procedure. However, problems can occur and include:  Bleeding from a tear in the lining of the esophagus.  A hole (perforation) in the esophagus. What happens before the procedure?  Do not eat or drink anything after midnight on the night before the procedure or as directed by your health care provider.  Ask your health care provider about changing or stopping your regular medicines. This is especially important if you are taking diabetes medicines or blood thinners.  Plan to have someone take you home after the procedure. What happens during the procedure?  You will be given a medicine that makes you relaxed and sleepy (sedative).  A medicine may be sprayed or gargled to numb the back of the throat.  Your health care provider can use various instruments to do an esophageal dilatation. During the procedure, the instrument used will be placed in your mouth and passed down into your esophagus. Options include:  Simple dilators. This instrument is carefully placed in the esophagus to stretch it.  Guided wire bougies. In this method, a flexible tube (endoscope) is used to insert a wire into the esophagus. The dilator is passed over this wire to enlarge the esophagus. Then the  wire is removed.  Balloon dilators. An endoscope with a small balloon at the end is passed down into the esophagus. Inflating the balloon gently stretches the esophagus and opens it up. What happens after the procedure?  Your blood pressure, heart rate, breathing rate, and blood oxygen level will be monitored often until the medicines you were given have worn off.  Your throat may feel slightly sore and will probably still feel numb. This will improve slowly over time.  You will not be allowed to eat or drink until the throat numbness has resolved.  If this is a same-day procedure, you may be allowed to go home once you have been able to drink, urinate, and sit on the edge of the bed without nausea or dizziness.  If this is a same-day procedure, you should have  a friend or family member with you for the next 24 hours after the procedure. This information is not intended to replace advice given to you by your health care provider. Make sure you discuss any questions you have with your health care provider. Document Released: 03/26/2005 Document Revised: 07/11/2015 Document Reviewed: 06/14/2013 Elsevier Interactive Patient Education  2017 [place].  Colonoscopy, Adult A colonoscopy is an exam to look at the entire large intestine. During the exam, a lubricated, bendable tube is inserted into the anus and then passed into the rectum, colon, and other parts of the large intestine. A colonoscopy is often done as a part of normal colorectal screening or in response to certain symptoms, such as anemia, persistent diarrhea, abdominal pain, and blood in the stool. The exam can help screen for and diagnose medical problems, including:  Tumors.  Polyps.  Inflammation.  Areas of bleeding. Tell a health care provider about:  Any allergies you have.  All medicines you are taking, including vitamins, herbs, eye drops, creams, and over-the-counter medicines.  Any problems you or family  members have had with anesthetic medicines.  Any blood disorders you have.  Any surgeries you have had.  Any medical conditions you have.  Any problems you have had passing stool. What are the risks? Generally, this is a safe procedure. However, problems may occur, including:  Bleeding.  A tear in the intestine.  A reaction to medicines given during the exam.  Infection (rare). What happens before the procedure? Eating and drinking restrictions  Follow instructions from your health care provider about eating and drinking, which may include:  A few days before the procedure - follow a low-fiber diet. Avoid nuts, seeds, dried fruit, raw fruits, and vegetables.  1-3 days before the procedure - follow a clear liquid diet. Drink only clear liquids, such as clear broth or bouillon, black coffee or tea, clear juice, clear soft drinks or sports drinks, gelatin desert, and popsicles. Avoid any liquids that contain red or purple dye.  On the day of the procedure - do not eat or drink anything during the 2 hours before the procedure, or within the time period that your health care provider recommends. Bowel prep  If you were prescribed an oral bowel prep to clean out your colon:  Take it as told by your health care provider. Starting the day before your procedure, you will need to drink a large amount of medicated liquid. The liquid will cause you to have multiple loose stools until your stool is almost clear or light green.  If your skin or anus gets irritated from diarrhea, you may use these to relieve the irritation:  Medicated wipes, such as adult wet wipes with aloe and vitamin E.  A skin soothing-product like petroleum jelly.  If you vomit while drinking the bowel prep, take a break for up to 60 minutes and then begin the bowel prep again. If vomiting continues and you cannot take the bowel prep without vomiting, call your health care provider. General instructions  Ask your  health care provider about changing or stopping your regular medicines. This is especially important if you are taking diabetes medicines or blood thinners.  Plan to have someone take you home from the hospital or clinic. What happens during the procedure?  An IV tube may be inserted into one of your veins.  You will be given medicine to help you relax (sedative).  To reduce your risk of infection:  Your health care team  will wash or sanitize their hands.  Your anal area will be washed with soap.  You will be asked to lie on your side with your knees bent.  Your health care provider will lubricate a long, thin, flexible tube. The tube will have a camera and a light on the end.  The tube will be inserted into your anus.  The tube will be gently eased through your rectum and colon.  Air will be delivered into your colon to keep it open. You may feel some pressure or cramping.  The camera will be used to take images during the procedure.  A small tissue sample may be removed from your body to be examined under a microscope (biopsy). If any potential problems are found, the tissue will be sent to a lab for testing.  If small polyps are found, your health care provider may remove them and have them checked for cancer cells.  The tube that was inserted into your anus will be slowly removed. The procedure may vary among health care providers and hospitals. What happens after the procedure?  Your blood pressure, heart rate, breathing rate, and blood oxygen level will be monitored until the medicines you were given have worn off.  Do not drive for 24 hours after the exam.  You may have a small amount of blood in your stool.  You may pass gas and have mild abdominal cramping or bloating due to the air that was used to inflate your colon during the exam.  It is up to you to get the results of your procedure. Ask your health care provider, or the department performing the procedure,  when your results will be ready. This information is not intended to replace advice given to you by your health care provider. Make sure you discuss any questions you have with your health care provider. Document Released: 01/31/2000 Document Revised: 08/23/2015 Document Reviewed: 04/16/2015 Elsevier Interactive Patient Education  2017 [employer]  Colonoscopy, Adult, Care After This sheet gives you information about how to care for yourself after your procedure. Your health care provider may also give you more specific instructions. If you have problems or questions, contact your health care provider. What can I expect after the procedure? After the procedure, it is common to have:  A small amount of blood in your stool for 24 hours after the procedure.  Some gas.  Mild abdominal cramping or bloating. Follow these instructions at home: General instructions  For the first 24 hours after the procedure:  Do not drive or use machinery.  Do not sign important documents.  Do not drink alcohol.  Do your regular daily activities at a slower pace than normal.  Eat soft, easy-to-digest foods.  Rest often.  Take over-the-counter or prescription medicines only as told by your health care provider.  It is up to you to get the results of your procedure. Ask your health care provider, or the department performing the procedure, when your results will be ready. Relieving cramping and bloating  Try walking around when you have cramps or feel bloated.  Apply heat to your abdomen as told by your health care provider. Use a heat source that your health care provider recommends, such as a moist heat pack or a heating pad.  Place a towel between your skin and the heat source.  Leave the heat on for 20-30 minutes.  Remove the heat if your skin turns bright red. This is especially important if you are unable to  feel pain, heat, or cold. You may have a greater risk of getting burned. Eating  and drinking  Drink enough fluid to keep your urine clear or pale yellow.  Resume your normal diet as instructed by your health care provider. Avoid heavy or fried foods that are hard to digest.  Avoid drinking alcohol for as long as instructed by your health care provider. Contact a health care provider if:  You have blood in your stool 2-3 days after the procedure. Get help right away if:  You have more than a small spotting of blood in your stool.  You pass large blood clots in your stool.  Your abdomen is swollen.  You have nausea or vomiting.  You have a fever.  You have increasing abdominal pain that is not relieved with medicine. This information is not intended to replace advice given to you by your health care provider. Make sure you discuss any questions you have with your health care provider. Document Released: 09/17/2003 Document Revised: 10/28/2015 Document Reviewed: 04/16/2015 Elsevier Interactive Patient Education  2017 [place] Anesthesia is a term that refers to techniques, procedures, and medicines that help a person stay safe and comfortable during a medical procedure. Monitored anesthesia care, or sedation, is one type of anesthesia. Your anesthesia specialist may recommend sedation if you will be having a procedure that does not require you to be unconscious, such as:  Cataract surgery.  A dental procedure.  A biopsy.  A colonoscopy. During the procedure, you may receive a medicine to help you relax (sedative). There are three levels of sedation:  Mild sedation. At this level, you may feel awake and relaxed. You will be able to follow directions.  Moderate sedation. At this level, you will be sleepy. You may not remember the procedure.  Deep sedation. At this level, you will be asleep. You will not remember the procedure. The more medicine you are given, the deeper your level of sedation will be. Depending on how you  respond to the procedure, the anesthesia specialist may change your level of sedation or the type of anesthesia to fit your needs. An anesthesia specialist will monitor you closely during the procedure. Let your health care provider know about:  Any allergies you have.  All medicines you are taking, including vitamins, herbs, eye drops, creams, and over-the-counter medicines.  Any use of steroids (by mouth or as a cream).  Any problems you or family members have had with sedatives and anesthetic medicines.  Any blood disorders you have.  Any surgeries you have had.  Any medical conditions you have, such as sleep apnea.  Whether you are pregnant or may be pregnant.  Any use of cigarettes, alcohol, or street drugs. What are the risks? Generally, this is a safe procedure. However, problems may occur, including:  Getting too much medicine (oversedation).  Nausea.  Allergic reaction to medicines.  Trouble breathing. If this happens, a breathing tube may be used to help with breathing. It will be removed when you are awake and breathing on your own.  Heart trouble.  Lung trouble. Before the procedure Staying hydrated  Follow instructions from your health care provider about hydration, which may include:  Up to 2 hours before the procedure - you may continue to drink clear liquids, such as water, clear fruit juice, black coffee, and plain tea. Eating and drinking restrictions  Follow instructions from your health care provider about eating and drinking, which may include:  8  hours before the procedure - stop eating heavy meals or foods such as meat, fried foods, or fatty foods.  6 hours before the procedure - stop eating light meals or foods, such as toast or cereal.  6 hours before the procedure - stop drinking milk or drinks that contain milk.  2 hours before the procedure - stop drinking clear liquids. Medicines  Ask your health care provider about:  Changing or  stopping your regular medicines. This is especially important if you are taking diabetes medicines or blood thinners.  Taking medicines such as aspirin and ibuprofen. These medicines can thin your blood. Do not take these medicines before your procedure if your health care provider instructs you not to. Tests and exams  You will have a physical exam.  You may have blood tests done to show:  How well your kidneys and liver are working.  How well your blood can clot.  General instructions  Plan to have someone take you home from the hospital or clinic.  If you will be going home right after the procedure, plan to have someone with you for 24 hours. What happens during the procedure?  Your blood pressure, heart rate, breathing, level of pain and overall condition will be monitored.  An IV tube will be inserted into one of your veins.  Your anesthesia specialist will give you medicines as needed to keep you comfortable during the procedure. This may mean changing the level of sedation.  The procedure will be performed. After the procedure  Your blood pressure, heart rate, breathing rate, and blood oxygen level will be monitored until the medicines you were given have worn off.  Do not drive for 24 hours if you received a sedative.  You may:  Feel sleepy, clumsy, or nauseous.  Feel forgetful about what happened after the procedure.  Have a sore throat if you had a breathing tube during the procedure.  Vomit. This information is not intended to replace advice given to you by your health care provider. Make sure you discuss any questions you have with your health care provider. Document Released: 10/29/2004 Document Revised: 07/12/2015 Document Reviewed: 05/26/2015 Elsevier Interactive Patient Education  2017 [place] POST-ANESTHESIA  IMMEDIATELY FOLLOWING SURGERY:  Do not drive or operate machinery for the first twenty four hours after surgery.  Do  not make any important decisions for twenty four hours after surgery or while taking narcotic pain medications or sedatives.  If you develop intractable nausea and vomiting or a severe headache please notify your doctor immediately.  FOLLOW-UP:  Please make an appointment with your surgeon as instructed. You do not need to follow up with anesthesia unless specifically instructed to do so.  WOUND CARE INSTRUCTIONS (if applicable):  Keep a dry clean dressing on the anesthesia/puncture wound site if there is drainage.  Once the wound has quit draining you may leave it open to air.  Generally you should leave the bandage intact for twenty four hours unless there is drainage.  If the epidural site drains for more than 36-48 hours please call the anesthesia department.  QUESTIONS?:  Please feel free to call your physician or the hospital operator if you have any questions, and they will be happy to assist you.

## 2016-01-01 ENCOUNTER — Encounter (HOSPITAL_COMMUNITY)
Admission: RE | Admit: 2016-01-01 | Discharge: 2016-01-01 | Disposition: A | Discharge status: Home / Self Care | Payer: Medicare Other | Source: Ambulatory Visit | Attending: Internal Medicine | Admitting: Internal Medicine

## 2016-01-01 ENCOUNTER — Encounter (HOSPITAL_COMMUNITY): Payer: Self-pay

## 2016-01-01 DIAGNOSIS — E669 Obesity, unspecified: Secondary | ICD-10-CM | POA: Insufficient documentation

## 2016-01-01 DIAGNOSIS — D472 Monoclonal gammopathy: Secondary | ICD-10-CM | POA: Insufficient documentation

## 2016-01-01 DIAGNOSIS — Z6834 Body mass index (BMI) 34.0-34.9, adult: Secondary | ICD-10-CM | POA: Insufficient documentation

## 2016-01-01 DIAGNOSIS — F17200 Nicotine dependence, unspecified, uncomplicated: Secondary | ICD-10-CM | POA: Insufficient documentation

## 2016-01-01 DIAGNOSIS — K219 Gastro-esophageal reflux disease without esophagitis: Secondary | ICD-10-CM | POA: Insufficient documentation

## 2016-01-01 DIAGNOSIS — R1310 Dysphagia, unspecified: Secondary | ICD-10-CM | POA: Insufficient documentation

## 2016-01-01 DIAGNOSIS — D649 Anemia, unspecified: Secondary | ICD-10-CM | POA: Insufficient documentation

## 2016-01-01 DIAGNOSIS — K5792 Diverticulitis of intestine, part unspecified, without perforation or abscess without bleeding: Secondary | ICD-10-CM | POA: Insufficient documentation

## 2016-01-01 DIAGNOSIS — I82403 Acute embolism and thrombosis of unspecified deep veins of lower extremity, bilateral: Secondary | ICD-10-CM | POA: Insufficient documentation

## 2016-01-01 DIAGNOSIS — E119 Type 2 diabetes mellitus without complications: Secondary | ICD-10-CM | POA: Insufficient documentation

## 2016-01-01 DIAGNOSIS — I10 Essential (primary) hypertension: Secondary | ICD-10-CM | POA: Insufficient documentation

## 2016-01-01 DIAGNOSIS — E039 Hypothyroidism, unspecified: Secondary | ICD-10-CM | POA: Insufficient documentation

## 2016-01-01 DIAGNOSIS — Z01812 Encounter for preprocedural laboratory examination: Secondary | ICD-10-CM | POA: Insufficient documentation

## 2016-01-01 HISTORY — DX: Pure hypercholesterolemia, unspecified: E78.00

## 2016-01-01 HISTORY — DX: Unspecified osteoarthritis, unspecified site: M19.90

## 2016-01-02 ENCOUNTER — Encounter (HOSPITAL_BASED_OUTPATIENT_CLINIC_OR_DEPARTMENT_OTHER): Payer: Medicare Other

## 2016-01-02 VITALS — BP 123/61 | HR 90 | Temp 98.4°F | Resp 18

## 2016-01-02 DIAGNOSIS — D509 Iron deficiency anemia, unspecified: Secondary | ICD-10-CM | POA: Diagnosis present

## 2016-01-02 DIAGNOSIS — D649 Anemia, unspecified: Secondary | ICD-10-CM

## 2016-01-02 MED ORDER — SODIUM CHLORIDE 0.9 % IV SOLN
510.0000 mg | Freq: Once | INTRAVENOUS | Status: AC
  Administered 2016-01-02: 510 mg via INTRAVENOUS
  Filled 2016-01-02: qty 17

## 2016-01-02 MED ORDER — SODIUM CHLORIDE 0.9 % IV SOLN
Freq: Once | INTRAVENOUS | Status: AC
  Administered 2016-01-02: 14:00:00 via INTRAVENOUS

## 2016-01-02 NOTE — Progress Notes (Deleted)
[facility] at [facility] Discharge Instructions  RECOMMENDATIONS MADE BY THE CONSULTANT AND ANY TEST RESULTS WILL BE SENT TO YOUR REFERRING PHYSICIAN.    Thank you for choosing [facility] at [facility] to provide your oncology and hematology care.  To afford each patient quality time with our provider, please arrive at least 15 minutes before your scheduled appointment time.   Beginning January 23rd 2017 lab work for the Cancer Center will be done in the  Main lab at [facility] on 1st floor. If you have a lab appointment with the Cancer Center please come in thru the  Main Entrance and check in at the main information desk  You need to re-schedule your appointment should you arrive 10 or more minutes late.  We strive to give you quality time with our providers, and arriving late affects you and other patients whose appointments are after yours.  Also, if you no show three or more times for appointments you may be dismissed from the clinic at the providers discretion.     Again, thank you for choosing [facility].  Our hope is that these requests will decrease the amount of time that you wait before being seen by our physicians.       _____________________________________________________________  Should you have questions after your visit to [facility], please contact our office at [phone] between the hours of 8:30 a.m. and 4:30 p.m.  Voicemails left after 4:30 p.m. will not be returned until the following business day.  For prescription refill requests, have your pharmacy contact our office.         Resources For Cancer Patients and their Caregivers ? American Cancer Society: Can assist with transportation, wigs, general needs, runs Look Good Feel Better.        1-888-227-6333 ? Cancer Care: Provides financial assistance, online support groups, medication/co-pay assistance.  1-800-813-HOPE (4673) ? [facility] Assists [place] cancer patients and their families through emotional , educational and financial support.  336-427-4357 ? [place] DSS Where to apply for food stamps, Medicaid and utility assistance. 336-342-1394 ? [employer]: Transportation to medical appointments. 336-347-2287 ? Social Security Administration: May apply for disability if have a Stage IV cancer. 336-342-7796 1-800-772-1213 ? [place] Aging, Disability and Transit Services: Assists with nutrition, care and transit needs. 336-349-2343  Cancer Center Support Programs: @10RELATIVEDAYS@ > Cancer Support Group  2nd Tuesday of the month 1pm-2pm, Journey Room  > Creative Journey  3rd Tuesday of the month 1130am-1pm, Journey Room  > Look Good Feel Better  1st Wednesday of the month 10am-12 noon, Journey Room (Call American Cancer Society to register 1-800-395-5775)   

## 2016-01-02 NOTE — Patient Instructions (Signed)
[facility] at [facility] Discharge Instructions  RECOMMENDATIONS MADE BY THE CONSULTANT AND ANY TEST RESULTS WILL BE SENT TO YOUR REFERRING PHYSICIAN.  Feraheme 510 mg iron infusion given today as ordered. Return as scheduled.  Thank you for choosing  [facility] Center at [facility] to provide your oncology and hematology care.  To afford each patient quality time with our provider, please arrive at least 15 minutes before your scheduled appointment time.   Beginning January 23rd 2017 lab work for the Cancer Center will be done in the  Main lab at [facility] on 1st floor. If you have a lab appointment with the Cancer Center please come in thru the  Main Entrance and check in at the main information desk  You need to re-schedule your appointment should you arrive 10 or more minutes late.  We strive to give you quality time with our providers, and arriving late affects you and other patients whose appointments are after yours.  Also, if you no show three or more times for appointments you may be dismissed from the clinic at the providers discretion.     Again, thank you for choosing [facility].  Our hope is that these requests will decrease the amount of time that you wait before being seen by our physicians.       _____________________________________________________________  Should you have questions after your visit to [facility], please contact our office at [phone] between the hours of 8:30 a.m. and 4:30 p.m.  Voicemails left after 4:30 p.m. will not be returned until the following business day.  For prescription refill requests, have your pharmacy contact our office.         Resources For Cancer Patients and their Caregivers ? American Cancer Society: Can assist with transportation, wigs, general needs, runs Look Good Feel Better.        1-888-227-6333 ? Cancer Care: Provides financial assistance, online  support groups, medication/co-pay assistance.  1-800-813-HOPE (4673) ? [facility] Assists [place] cancer patients and their families through emotional , educational and financial support.  336-427-4357 ? [place] DSS Where to apply for food stamps, Medicaid and utility assistance. 336-342-1394 ? [employer]: Transportation to medical appointments. 336-347-2287 ? Social Security Administration: May apply for disability if have a Stage IV cancer. 336-342-7796 1-800-772-1213 ? [place] Aging, Disability and Transit Services: Assists with nutrition, care and transit needs. 336-349-2343  Cancer Center Support Programs: @10RELATIVEDAYS@ > Cancer Support Group  2nd Tuesday of the month 1pm-2pm, Journey Room  > Creative Journey  3rd Tuesday of the month 1130am-1pm, Journey Room  > Look Good Feel Better  1st Wednesday of the month 10am-12 noon, Journey Room (Call American Cancer Society to register 1-800-395-5775)   

## 2016-01-02 NOTE — Progress Notes (Signed)
Tolerated iron infusion well. Stable and ambulatory on discharge home to self. 

## 2016-01-03 ENCOUNTER — Encounter (HOSPITAL_COMMUNITY): Payer: Medicare Other

## 2016-01-03 DIAGNOSIS — D472 Monoclonal gammopathy: Secondary | ICD-10-CM

## 2016-01-03 DIAGNOSIS — D649 Anemia, unspecified: Secondary | ICD-10-CM | POA: Diagnosis not present

## 2016-01-03 LAB — RENAL FUNCTION PANEL
ALBUMIN: 3.3 g/dL — AB (ref 3.5–5.0)
Anion gap: 8 (ref 5–15)
BUN: 20 mg/dL (ref 6–20)
CALCIUM: 9.6 mg/dL (ref 8.9–10.3)
CO2: 25 mmol/L (ref 22–32)
CREATININE: 1.02 mg/dL — AB (ref 0.44–1.00)
Chloride: 106 mmol/L (ref 101–111)
GFR, EST NON AFRICAN AMERICAN: 56 mL/min — AB (ref >60)
Glucose, Bld: 152 mg/dL — ABNORMAL HIGH (ref 65–99)
PHOSPHORUS: 3.6 mg/dL (ref 2.5–4.6)
Potassium: 3.7 mmol/L (ref 3.5–5.1)
SODIUM: 139 mmol/L (ref 135–145)

## 2016-01-06 ENCOUNTER — Ambulatory Visit (HOSPITAL_COMMUNITY): Payer: Medicare Other | Admitting: Anesthesiology

## 2016-01-06 ENCOUNTER — Encounter (HOSPITAL_COMMUNITY): Payer: Self-pay | Admitting: *Deleted

## 2016-01-06 ENCOUNTER — Encounter (HOSPITAL_COMMUNITY)
Admission: RE | Disposition: A | Discharge status: Home / Self Care | Payer: Self-pay | Source: Ambulatory Visit | Attending: Internal Medicine

## 2016-01-06 ENCOUNTER — Ambulatory Visit (HOSPITAL_COMMUNITY)
Admission: RE | Admit: 2016-01-06 | Discharge: 2016-01-06 | Disposition: A | Discharge status: Home / Self Care | Payer: Medicare Other | Source: Ambulatory Visit | Attending: Internal Medicine | Admitting: Internal Medicine

## 2016-01-06 DIAGNOSIS — E119 Type 2 diabetes mellitus without complications: Secondary | ICD-10-CM | POA: Insufficient documentation

## 2016-01-06 DIAGNOSIS — Z86718 Personal history of other venous thrombosis and embolism: Secondary | ICD-10-CM | POA: Insufficient documentation

## 2016-01-06 DIAGNOSIS — Z7951 Long term (current) use of inhaled steroids: Secondary | ICD-10-CM | POA: Diagnosis not present

## 2016-01-06 DIAGNOSIS — K5730 Diverticulosis of large intestine without perforation or abscess without bleeding: Secondary | ICD-10-CM | POA: Diagnosis not present

## 2016-01-06 DIAGNOSIS — Z79899 Other long term (current) drug therapy: Secondary | ICD-10-CM | POA: Insufficient documentation

## 2016-01-06 DIAGNOSIS — R1314 Dysphagia, pharyngoesophageal phase: Secondary | ICD-10-CM | POA: Insufficient documentation

## 2016-01-06 DIAGNOSIS — R195 Other fecal abnormalities: Secondary | ICD-10-CM | POA: Insufficient documentation

## 2016-01-06 DIAGNOSIS — D472 Monoclonal gammopathy: Secondary | ICD-10-CM | POA: Insufficient documentation

## 2016-01-06 DIAGNOSIS — Z794 Long term (current) use of insulin: Secondary | ICD-10-CM | POA: Diagnosis not present

## 2016-01-06 DIAGNOSIS — E039 Hypothyroidism, unspecified: Secondary | ICD-10-CM | POA: Insufficient documentation

## 2016-01-06 DIAGNOSIS — M797 Fibromyalgia: Secondary | ICD-10-CM | POA: Diagnosis not present

## 2016-01-06 DIAGNOSIS — Z87891 Personal history of nicotine dependence: Secondary | ICD-10-CM | POA: Diagnosis not present

## 2016-01-06 DIAGNOSIS — J449 Chronic obstructive pulmonary disease, unspecified: Secondary | ICD-10-CM | POA: Insufficient documentation

## 2016-01-06 DIAGNOSIS — K219 Gastro-esophageal reflux disease without esophagitis: Secondary | ICD-10-CM | POA: Diagnosis not present

## 2016-01-06 DIAGNOSIS — I10 Essential (primary) hypertension: Secondary | ICD-10-CM | POA: Diagnosis not present

## 2016-01-06 DIAGNOSIS — R1310 Dysphagia, unspecified: Secondary | ICD-10-CM | POA: Diagnosis not present

## 2016-01-06 DIAGNOSIS — Z7901 Long term (current) use of anticoagulants: Secondary | ICD-10-CM | POA: Insufficient documentation

## 2016-01-06 HISTORY — PX: COLONOSCOPY WITH PROPOFOL: SHX5780

## 2016-01-06 HISTORY — PX: ESOPHAGOGASTRODUODENOSCOPY (EGD) WITH PROPOFOL: SHX5813

## 2016-01-06 HISTORY — PX: MALONEY DILATION: SHX5535

## 2016-01-06 LAB — GLUCOSE, CAPILLARY
GLUCOSE-CAPILLARY: 146 mg/dL — AB (ref 65–99)
GLUCOSE-CAPILLARY: 132 mg/dL — AB (ref 65–99)

## 2016-01-06 SURGERY — COLONOSCOPY WITH PROPOFOL
Anesthesia: Monitor Anesthesia Care

## 2016-01-06 MED ORDER — CHLORHEXIDINE GLUCONATE CLOTH 2 % EX PADS: 6.0000 | MEDICATED_PAD | Freq: Once | CUTANEOUS | Status: DC

## 2016-01-06 MED ORDER — LIDOCAINE HCL (PF) 1 % IJ SOLN
INTRAMUSCULAR | Status: AC
  Filled 2016-01-06: qty 5

## 2016-01-06 MED ORDER — LIDOCAINE HCL (CARDIAC) 20 MG/ML IV SOLN
INTRAVENOUS | Status: DC | PRN
  Administered 2016-01-06: 50 mg via INTRATRACHEAL

## 2016-01-06 MED ORDER — PROPOFOL 500 MG/50ML IV EMUL
INTRAVENOUS | Status: DC | PRN
  Administered 2016-01-06: 80 ug/kg/min via INTRAVENOUS
  Administered 2016-01-06: 75 ug/kg/min via INTRAVENOUS

## 2016-01-06 MED ORDER — FENTANYL CITRATE (PF) 100 MCG/2ML IJ SOLN
25.0000 ug | INTRAMUSCULAR | Status: AC | PRN
  Administered 2016-01-06 (×2): 25 ug via INTRAVENOUS

## 2016-01-06 MED ORDER — ONDANSETRON HCL 4 MG/2ML IJ SOLN
INTRAMUSCULAR | Status: AC
Start: 2016-01-06 — End: 2016-01-06
  Filled 2016-01-06: qty 2

## 2016-01-06 MED ORDER — MIDAZOLAM HCL 2 MG/2ML IJ SOLN
INTRAMUSCULAR | Status: AC
  Filled 2016-01-06: qty 2

## 2016-01-06 MED ORDER — LIDOCAINE HCL (PF) 1 % IJ SOLN
INTRAMUSCULAR | Status: AC
  Filled 2016-01-06: qty 2

## 2016-01-06 MED ORDER — FENTANYL CITRATE (PF) 100 MCG/2ML IJ SOLN
INTRAMUSCULAR | Status: AC
  Filled 2016-01-06: qty 2

## 2016-01-06 MED ORDER — LACTATED RINGERS IV SOLN
INTRAVENOUS | Status: DC
  Administered 2016-01-06: 13:00:00 via INTRAVENOUS

## 2016-01-06 MED ORDER — PROPOFOL 10 MG/ML IV BOLUS
INTRAVENOUS | Status: DC | PRN
  Administered 2016-01-06 (×2): 20 mg via INTRAVENOUS

## 2016-01-06 MED ORDER — PROPOFOL 10 MG/ML IV BOLUS
INTRAVENOUS | Status: AC
  Filled 2016-01-06: qty 40

## 2016-01-06 MED ORDER — MIDAZOLAM HCL 2 MG/2ML IJ SOLN
1.0000 mg | INTRAMUSCULAR | Status: DC | PRN
  Administered 2016-01-06 (×2): 2 mg via INTRAVENOUS
  Filled 2016-01-06: qty 2

## 2016-01-06 MED ORDER — LIDOCAINE VISCOUS 2 % MT SOLN
OROMUCOSAL | Status: AC
  Administered 2016-01-06: 5 mL via OROMUCOSAL
  Filled 2016-01-06: qty 15

## 2016-01-06 MED ORDER — ONDANSETRON HCL 4 MG/2ML IJ SOLN
4.0000 mg | Freq: Once | INTRAMUSCULAR | Status: AC
  Administered 2016-01-06: 4 mg via INTRAVENOUS

## 2016-01-06 MED ORDER — LIDOCAINE VISCOUS 2 % MT SOLN
5.0000 mL | Freq: Once | OROMUCOSAL | Status: AC
  Administered 2016-01-06: 5 mL via OROMUCOSAL

## 2016-01-06 NOTE — Interval H&P Note (Signed)
History and Physical Interval Note:  01/06/2016 1:20 PM  Teerica Reitman Bonnell  has presented today for surgery, with the diagnosis of heme positive, dysphagia  The various methods of treatment have been discussed with the patient and family. After consideration of risks, benefits and other options for treatment, the patient has consented to  Procedure(s) with comments: COLONOSCOPY WITH PROPOFOL (N/A) - 12:30 pm ESOPHAGOGASTRODUODENOSCOPY (EGD) WITH PROPOFOL (N/A) MALONEY DILATION (N/A) as a surgical intervention .  The patient's history has been reviewed, patient examined, no change in status, stable for surgery.  I have reviewed the patient's chart and labs.  Questions were answered to the patient's satisfaction.     Grier Vu  No change. Eliquis  held 3 days ago. EGD with ED and colonoscopy per plan.   The risks, benefits, limitations, imponderables and alternatives regarding both EGD and colonoscopy have been reviewed with the patient. Questions have been answered. All parties agreeable.

## 2016-01-06 NOTE — Op Note (Signed)
[facility] Patient Name: Stephanie [name] Procedure Date: 01/06/2016 2:32 PM MRN: [id number] Date of Birth: Aug 23, 1950 Attending MD: Norvel Richards , MD CSN: [id number] Age: 65 Admit Type: Outpatient Procedure:                Colonoscopy Indications:              Heme positive stool Providers:                Norvel Richards, MD, Hinton Rao, RN, [facility], Technician Referring MD:             Rosita Fire Medicines:                Propofol per Anesthesia Complications:            No immediate complications. Estimated Blood Loss:     Estimated blood loss: none. Procedure:                Pre-Anesthesia Assessment:                           - Prior to the procedure, a History and Physical                            was performed, and patient medications and                            allergies were reviewed. The patient's tolerance of                            previous anesthesia was also reviewed. The risks                            and benefits of the procedure and the sedation                            options and risks were discussed with the patient.                            All questions were answered, and informed consent                            was obtained. Prior Anticoagulants: The patient has                            taken no previous anticoagulant or antiplatelet                            agents and last took Eliquis (apixaban) 2 days                            prior to the procedure. ASA Grade Assessment: III -  A patient with severe systemic disease. After                            reviewing the risks and benefits, the patient was                            deemed in satisfactory condition to undergo the                            procedure.                           After obtaining informed consent, the colonoscope                            was passed under direct vision. Throughout the                          procedure, the patient's blood pressure, pulse, and                            oxygen saturations were monitored continuously. The                            [id number] scope was introduced through                            the and advanced to the 5 cm into the ileum. The                            colonoscopy was performed without difficulty. The                            patient tolerated the procedure well. The quality                            of the bowel preparation was adequate. The terminal                            ileum, ileocecal valve, appendiceal orifice, and                            rectum were photographed. The entire colon was well                            visualized. The terminal ileum, ileocecal valve,                            appendiceal orifice, and rectum were photographed. Scope In: 2:37:06 PM Scope Out: 2:51:33 PM Scope Withdrawal Time: 0 hours 10 minutes 23 seconds  Total Procedure Duration: 0 hours 14 minutes 27 seconds  Findings:      The perianal and digital rectal examinations were normal.      Scattered small and large-mouthed diverticula were found in the entire  colon. normal terminal ileum      The exam was otherwise without abnormality on direct and retroflexion       views. Impression:               - Diverticulosis in the entire examined colon.                           - The examination was otherwise normal on direct                            and retroflexion views.                           - No specimens collected. Moderate Sedation:      Moderate (conscious) sedation was personally administered by an       anesthesia professional. The following parameters were monitored: oxygen       saturation, heart rate, blood pressure, respiratory rate, EKG, adequacy       of pulmonary ventilation, and response to care. Total physician       intraservice time was 39 minutes. Recommendation:           - Written  discharge instructions were provided to                            the patient.                           - The signs and symptoms of potential delayed                            complications were discussed with the patient.                           - Patient has a contact number available for                            emergencies.                           - Return to normal activities tomorrow.                           - Resume previous diet.                           - Continue present medications.                           - Repeat colonoscopy in 10 years for screening                            purposes.                           - Return to GI clinic in 12 weeks. Resume Eliquis  today. See EGD note Procedure Code(s):        --- Professional ---                           [phone], Colonoscopy, flexible; diagnostic, including                            collection of specimen(s) by brushing or washing,                            when performed (separate procedure) Diagnosis Code(s):        --- Professional ---                           R19.5, Other fecal abnormalities                           K57.30, Diverticulosis of large intestine without                            perforation or abscess without bleeding CPT copyright 2016 American Medical Association. All rights reserved. The codes documented in this report are preliminary and upon coder review may  be revised to meet current compliance requirements. Cristopher Estimable. Millie Shorb, MD Norvel Richards, MD 01/06/2016 3:05:41 PM This report has been signed electronically. Number of Addenda: 0

## 2016-01-06 NOTE — Anesthesia Procedure Notes (Signed)
Procedure Name: MAC Date/Time: 01/06/2016 2:09 PM Performed by: Vista Deck Pre-anesthesia Checklist: Patient identified, Emergency Drugs available, Suction available, Timeout performed and Patient being monitored Patient Re-evaluated:Patient Re-evaluated prior to inductionOxygen Delivery Method: Non-rebreather mask

## 2016-01-06 NOTE — Anesthesia Preprocedure Evaluation (Signed)
Anesthesia Evaluation  Patient identified by MRN, date of birth, ID band Patient awake    Reviewed: Allergy & Precautions, H&P , NPO status , Patient's Chart, lab work & pertinent test results, reviewed documented beta blocker date and time   History of Anesthesia Complications (+) PONV and history of anesthetic complications  Airway Mallampati: II  TM Distance: >3 FB     Dental  (+) Teeth Intact   Pulmonary asthma , COPD, Current Smoker, former smoker,    breath sounds clear to auscultation       Cardiovascular hypertension, Pt. on medications + dysrhythmias (hx tachycardia)  Rhythm:Regular Rate:Normal     Neuro/Psych    GI/Hepatic hiatal hernia, GERD  Controlled,  Endo/Other  diabetes, Type 2, Oral Hypoglycemic Agents, Insulin DependentHypothyroidism   Renal/GU      Musculoskeletal  (+) Fibromyalgia -  Abdominal   Peds  Hematology  (+) anemia ,   Anesthesia Other Findings   Reproductive/Obstetrics                             Anesthesia Physical Anesthesia Plan  ASA: III  Anesthesia Plan: MAC   Post-op Pain Management:    Induction: Intravenous  Airway Management Planned: Simple Face Mask  Additional Equipment:   Intra-op Plan:   Post-operative Plan:   Informed Consent: I have reviewed the patients History and Physical, chart, labs and discussed the procedure including the risks, benefits and alternatives for the proposed anesthesia with the patient or authorized representative who has indicated his/her understanding and acceptance.     Plan Discussed with:   Anesthesia Plan Comments:         Anesthesia Quick Evaluation

## 2016-01-06 NOTE — Anesthesia Postprocedure Evaluation (Signed)
Anesthesia Post Note  Patient: Stephanie [name]  Procedure(s) Performed: Procedure(s) (LRB): COLONOSCOPY WITH PROPOFOL (N/A) ESOPHAGOGASTRODUODENOSCOPY (EGD) WITH PROPOFOL (N/A) MALONEY DILATION (N/A)  Patient location during evaluation: PACU Anesthesia Type: MAC Level of consciousness: awake and alert Pain management: pain level controlled Vital Signs Assessment: post-procedure vital signs reviewed and stable Respiratory status: spontaneous breathing Cardiovascular status: stable Anesthetic complications: no    Last Vitals:  Vitals:   01/06/16 1400 01/06/16 1405  BP: 115/69 115/69  Resp: 18 12    Last Pain:  Vitals:   01/06/16 1300  PainSc: 0-No pain                 Oakley Orban

## 2016-01-06 NOTE — Op Note (Signed)
[facility] Patient Name: Stephanie [name] Procedure Date: 01/06/2016 2:12 PM MRN: [id number] Date of Birth: Jun 08, 1950 Attending MD: Norvel Richards , MD CSN: [id number] Age: 65 Admit Type: Outpatient Procedure:                Upper GI endoscopy Indications:              Esophageal dysphagia Providers:                Norvel Richards, MD, Hinton Rao, RN, Sherlyn Lees, Technician Referring MD:              Medicines:                Propofol per Anesthesia Complications:            No immediate complications. Estimated Blood Loss:     Estimated blood loss: none. Procedure:                Pre-Anesthesia Assessment:                           - Prior to the procedure, a History and Physical                            was performed, and patient medications and                            allergies were reviewed. The patient's tolerance of                            previous anesthesia was also reviewed. The risks                            and benefits of the procedure and the sedation                            options and risks were discussed with the patient.                            All questions were answered, and informed consent                            was obtained. Prior Anticoagulants: The patient has                            taken no previous anticoagulant or antiplatelet                            agents and last took Eliquis (apixaban) 2 days                            prior to the procedure. ASA Grade Assessment: III -  A patient with severe systemic disease. After                            reviewing the risks and benefits, the patient was                            deemed in satisfactory condition to undergo the                            procedure.                           After obtaining informed consent, the endoscope was                            passed under direct vision. Throughout the                      procedure, the patient's blood pressure, pulse, and                            oxygen saturations were monitored continuously. The                            [id number]) was introduced through the and                            advanced to the second part of duodenum. The upper                            GI endoscopy was accomplished without difficulty.                            The patient tolerated the procedure well. Scope In: 2:21:36 PM Scope Out: 2:29:38 PM Total Procedure Duration: 0 hours 8 minutes 2 seconds  Findings:      The exam of the esophagus was otherwise normal.      The entire examined stomach was normal.      The duodenal bulb and second portion of the duodenum were normal. Scope       was withdrawn and a 37 F Maloney dilator was to full insertion .       Subsequently, a 45 French Maloney dilator wass passed a full insertion       easily. A look back revealed no apparent Complication. Impression:               - Normal stomach. Normal esophagus. Status post                            esophageal dilation.                           - Normal duodenal bulb and second portion of the                            duodenum.                           -  No specimens collected. Moderate Sedation:      Moderate (conscious) sedation was personally administered by an       anesthesia professional. The following parameters were monitored: oxygen       saturation, heart rate, blood pressure, respiratory rate, EKG, adequacy       of pulmonary ventilation, and response to care. Total physician       intraservice time was 39 minutes. Recommendation:           - Patient has a contact number available for                            emergencies. The signs and symptoms of potential                            delayed complications were discussed with the                            patient. Return to normal activities tomorrow.                            Written  discharge instructions were provided to the                            patient.                           - Advance diet as tolerated.                           - Continue present medications.                           - No repeat upper endoscopy.                           - Return to GI office in 12 weeks. Resume Eliquis                            today. Procedure Code(s):        --- Professional ---                           [phone], Esophagogastroduodenoscopy, flexible,                            transoral; diagnostic, including collection of                            specimen(s) by brushing or washing, when performed                            (separate procedure) Diagnosis Code(s):        --- Professional ---                           R13.14, Dysphagia, pharyngoesophageal phase CPT copyright 2016 American Medical Association. All rights reserved. The codes  documented in this report are preliminary and upon coder review may  be revised to meet current compliance requirements. Cristopher Estimable. Chet Greenley, MD Norvel Richards, MD 01/06/2016 3:01:41 PM This report has been signed electronically. Number of Addenda: 0

## 2016-01-06 NOTE — Transfer of Care (Signed)
Immediate Anesthesia Transfer of Care Note  Patient: Stephanie [name]  Procedure(s) Performed: Procedure(s) with comments: COLONOSCOPY WITH PROPOFOL (N/A) - 12:30 pm ESOPHAGOGASTRODUODENOSCOPY (EGD) WITH PROPOFOL (N/A) MALONEY DILATION (N/A)  Patient Location: PACU  Anesthesia Type:MAC  Level of Consciousness: awake  Airway & Oxygen Therapy: Patient Spontanous Breathing  Post-op Assessment: Report given to RN and Post -op Vital signs reviewed and stable  Post vital signs: Reviewed and stable  Last Vitals:  Vitals:   01/06/16 1400 01/06/16 1405  BP: 115/69 115/69  Resp: 18 12    Last Pain:  Vitals:   01/06/16 1300  PainSc: 0-No pain      Patients Stated Pain Goal: 4 ([id number])  Complications: No apparent anesthesia complications

## 2016-01-06 NOTE — H&P (View-Only) (Signed)
Please let patient know that there is no evidence of acute findings on CT that could explain lower abdominal discomfort. I am curious to see what the colonoscopy/EGD shows. There is evidence of possible localized gastritis on CT scan, so an EGD will help sort that out further.

## 2016-01-06 NOTE — Discharge Instructions (Addendum)
Diverticulosis Diverticulosis is the condition that develops when small pouches (diverticula) form in the wall of your colon. Your colon, or large intestine, is where water is absorbed and stool is formed. The pouches form when the inside layer of your colon pushes through weak spots in the outer layers of your colon. CAUSES  No one knows exactly what causes diverticulosis. RISK FACTORS  Being older than 57. Your risk for this condition increases with age. Diverticulosis is rare in people younger than 40 years. By age 42, almost everyone has it.  Eating a low-fiber diet.  Being frequently constipated.  Being overweight.  Not getting enough exercise.  Smoking.  Taking over-the-counter pain medicines, like aspirin and ibuprofen. SYMPTOMS  Most people with diverticulosis do not have symptoms. DIAGNOSIS  Because diverticulosis often has no symptoms, health care providers often discover the condition during an exam for other colon problems. In many cases, a health care provider will diagnose diverticulosis while using a flexible scope to examine the colon (colonoscopy). TREATMENT  If you have never developed an infection related to diverticulosis, you may not need treatment. If you have had an infection before, treatment may include:  Eating more fruits, vegetables, and grains.  Taking a fiber supplement.  Taking a live bacteria supplement (probiotic).  Taking medicine to relax your colon. HOME CARE INSTRUCTIONS   Drink at least 6-8 glasses of water each day to prevent constipation.  Try not to strain when you have a bowel movement.  Keep all follow-up appointments. If you have had an infection before:  Increase the fiber in your diet as directed by your health care provider or dietitian.  Take a dietary fiber supplement if your health care provider approves.  Only take medicines as directed by your health care provider. SEEK MEDICAL CARE IF:   You have abdominal  pain.  You have bloating.  You have cramps.  You have not gone to the bathroom in 3 days. SEEK IMMEDIATE MEDICAL CARE IF:   Your pain gets worse.  Yourbloating becomes very bad.  You have a fever or chills, and your symptoms suddenly get worse.  You begin vomiting.  You have bowel movements that are bloody or black. MAKE SURE YOU:  Understand these instructions.  Will watch your condition.  Will get help right away if you are not doing well or get worse. This information is not intended to replace advice given to you by your health care provider. Make sure you discuss any questions you have with your health care provider. Document Released: 10/31/2003 Document Revised: 02/07/2013 Document Reviewed: 12/28/2012 Elsevier Interactive Patient Education  2017 [employer]  Colonoscopy Discharge Instructions  Read the instructions outlined below and refer to this sheet in the next few weeks. These discharge instructions provide you with general information on caring for yourself after you leave the hospital. Your doctor may also give you specific instructions. While your treatment has been planned according to the most current medical practices available, unavoidable complications occasionally occur. If you have any problems or questions after discharge, call Dr. Gala Romney at [phone]. ACTIVITY  You may resume your regular activity, but move at a slower pace for the next 24 hours.   Take frequent rest periods for the next 24 hours.   Walking will help get rid of the air and reduce the bloated feeling in your belly (abdomen).   No driving for 24 hours (because of the medicine (anesthesia) used during the test).    Do not sign  any important legal documents or operate any machinery for 24 hours (because of the anesthesia used during the test).  NUTRITION  Drink plenty of fluids.   You may resume your normal diet as instructed by your doctor.   Begin with a light meal and  progress to your normal diet. Heavy or fried foods are harder to digest and may make you feel sick to your stomach (nauseated).   Avoid alcoholic beverages for 24 hours or as instructed.  MEDICATIONS  You may resume your normal medications unless your doctor tells you otherwise.  WHAT YOU CAN EXPECT TODAY  Some feelings of bloating in the abdomen.   Passage of more gas than usual.   Spotting of blood in your stool or on the toilet paper.  IF YOU HAD POLYPS REMOVED DURING THE COLONOSCOPY:  No aspirin products for 7 days or as instructed.   No alcohol for 7 days or as instructed.   Eat a soft diet for the next 24 hours.  FINDING OUT THE RESULTS OF YOUR TEST Not all test results are available during your visit. If your test results are not back during the visit, make an appointment with your caregiver to find out the results. Do not assume everything is normal if you have not heard from your caregiver or the medical facility. It is important for you to follow up on all of your test results.  SEEK IMMEDIATE MEDICAL ATTENTION IF:  You have more than a spotting of blood in your stool.   Your belly is swollen (abdominal distention).   You are nauseated or vomiting.   You have a temperature over 101.   You have abdominal pain or discomfort that is severe or gets worse throughout the day.  EGD Discharge instructions Please read the instructions outlined below and refer to this sheet in the next few weeks. These discharge instructions provide you with general information on caring for yourself after you leave the hospital. Your doctor may also give you specific instructions. While your treatment has been planned according to the most current medical practices available, unavoidable complications occasionally occur. If you have any problems or questions after discharge, please call your doctor. ACTIVITY  You may resume your regular activity but move at a slower pace for the next 24 hours.     Take frequent rest periods for the next 24 hours.   Walking will help expel (get rid of) the air and reduce the bloated feeling in your abdomen.   No driving for 24 hours (because of the anesthesia (medicine) used during the test).   You may shower.   Do not sign any important legal documents or operate any machinery for 24 hours (because of the anesthesia used during the test).  NUTRITION  Drink plenty of fluids.   You may resume your normal diet.   Begin with a light meal and progress to your normal diet.   Avoid alcoholic beverages for 24 hours or as instructed by your caregiver.  MEDICATIONS  You may resume your normal medications unless your caregiver tells you otherwise.  WHAT YOU CAN EXPECT TODAY  You may experience abdominal discomfort such as a feeling of fullness or gas pains.  FOLLOW-UP  Your doctor will discuss the results of your test with you.  SEEK IMMEDIATE MEDICAL ATTENTION IF ANY OF THE FOLLOWING OCCUR:  Excessive nausea (feeling sick to your stomach) and/or vomiting.   Severe abdominal pain and distention (swelling).   Trouble swallowing.   Temperature  over 101 F (37.8 C).   Rectal bleeding or vomiting of blood.    Colon diverticulosis information provided  Return for screening colonoscopy in 10 years  Office visit with [place] in 3 months.  Resume Eliquis today.

## 2016-01-07 ENCOUNTER — Other Ambulatory Visit (HOSPITAL_COMMUNITY): Payer: Medicare Other

## 2016-01-13 ENCOUNTER — Encounter (HOSPITAL_COMMUNITY): Payer: Self-pay | Admitting: Internal Medicine

## 2016-01-20 DIAGNOSIS — N183 Chronic kidney disease, stage 3 (moderate): Secondary | ICD-10-CM | POA: Diagnosis not present

## 2016-01-20 DIAGNOSIS — E1140 Type 2 diabetes mellitus with diabetic neuropathy, unspecified: Secondary | ICD-10-CM | POA: Diagnosis not present

## 2016-01-20 DIAGNOSIS — R600 Localized edema: Secondary | ICD-10-CM | POA: Diagnosis not present

## 2016-01-21 ENCOUNTER — Telehealth: Payer: Self-pay | Admitting: Gastroenterology

## 2016-01-21 NOTE — Telephone Encounter (Signed)
Patient needs liver biopsy due to elevated LFTs. Please see if patient is willing to pursue this.

## 2016-01-22 NOTE — Telephone Encounter (Signed)
LMOM to call back

## 2016-01-24 NOTE — Telephone Encounter (Signed)
Left message with family for her to call us back 

## 2016-01-27 ENCOUNTER — Other Ambulatory Visit: Payer: Self-pay

## 2016-01-27 DIAGNOSIS — R945 Abnormal results of liver function studies: Principal | ICD-10-CM

## 2016-01-27 DIAGNOSIS — R7989 Other specified abnormal findings of blood chemistry: Secondary | ICD-10-CM

## 2016-01-27 NOTE — Telephone Encounter (Signed)
Pt is aware and she would like to go ahead with the liver bx.

## 2016-01-27 NOTE — Telephone Encounter (Signed)
Ordered entered. Awaiting to be scheduled.

## 2016-01-29 ENCOUNTER — Telehealth: Payer: Self-pay | Admitting: Gastroenterology

## 2016-01-29 NOTE — Telephone Encounter (Signed)
Patient has upcoming liver biopsy 02/03/16. On Eliquis. Will need to hold for 48 hours. This is appropriate. I am routing to [place] per her request. Hold Eliquis starting 02/01/16.

## 2016-01-31 ENCOUNTER — Other Ambulatory Visit: Payer: Self-pay | Admitting: Radiology

## 2016-02-03 ENCOUNTER — Ambulatory Visit (HOSPITAL_COMMUNITY)
Admission: RE | Admit: 2016-02-03 | Discharge: 2016-02-03 | Disposition: A | Discharge status: Home / Self Care | Payer: Medicare Other | Source: Ambulatory Visit | Attending: Gastroenterology | Admitting: Gastroenterology

## 2016-02-03 ENCOUNTER — Encounter (HOSPITAL_COMMUNITY): Payer: Self-pay

## 2016-02-03 DIAGNOSIS — R945 Abnormal results of liver function studies: Secondary | ICD-10-CM | POA: Diagnosis not present

## 2016-02-03 DIAGNOSIS — E0590 Thyrotoxicosis, unspecified without thyrotoxic crisis or storm: Secondary | ICD-10-CM | POA: Insufficient documentation

## 2016-02-03 DIAGNOSIS — R000 Tachycardia, unspecified: Secondary | ICD-10-CM | POA: Insufficient documentation

## 2016-02-03 DIAGNOSIS — I10 Essential (primary) hypertension: Secondary | ICD-10-CM | POA: Insufficient documentation

## 2016-02-03 DIAGNOSIS — E7800 Pure hypercholesterolemia, unspecified: Secondary | ICD-10-CM | POA: Insufficient documentation

## 2016-02-03 DIAGNOSIS — K589 Irritable bowel syndrome without diarrhea: Secondary | ICD-10-CM | POA: Insufficient documentation

## 2016-02-03 DIAGNOSIS — M797 Fibromyalgia: Secondary | ICD-10-CM | POA: Diagnosis not present

## 2016-02-03 DIAGNOSIS — J449 Chronic obstructive pulmonary disease, unspecified: Secondary | ICD-10-CM | POA: Insufficient documentation

## 2016-02-03 DIAGNOSIS — K760 Fatty (change of) liver, not elsewhere classified: Secondary | ICD-10-CM | POA: Insufficient documentation

## 2016-02-03 DIAGNOSIS — Z794 Long term (current) use of insulin: Secondary | ICD-10-CM | POA: Insufficient documentation

## 2016-02-03 DIAGNOSIS — K219 Gastro-esophageal reflux disease without esophagitis: Secondary | ICD-10-CM | POA: Diagnosis not present

## 2016-02-03 DIAGNOSIS — Z86718 Personal history of other venous thrombosis and embolism: Secondary | ICD-10-CM | POA: Insufficient documentation

## 2016-02-03 DIAGNOSIS — Z87891 Personal history of nicotine dependence: Secondary | ICD-10-CM | POA: Diagnosis not present

## 2016-02-03 DIAGNOSIS — K739 Chronic hepatitis, unspecified: Secondary | ICD-10-CM | POA: Diagnosis not present

## 2016-02-03 DIAGNOSIS — E119 Type 2 diabetes mellitus without complications: Secondary | ICD-10-CM | POA: Insufficient documentation

## 2016-02-03 DIAGNOSIS — Z880 Allergy status to penicillin: Secondary | ICD-10-CM | POA: Diagnosis not present

## 2016-02-03 DIAGNOSIS — K449 Diaphragmatic hernia without obstruction or gangrene: Secondary | ICD-10-CM | POA: Diagnosis not present

## 2016-02-03 DIAGNOSIS — R7989 Other specified abnormal findings of blood chemistry: Secondary | ICD-10-CM | POA: Diagnosis not present

## 2016-02-03 DIAGNOSIS — Z9049 Acquired absence of other specified parts of digestive tract: Secondary | ICD-10-CM | POA: Diagnosis not present

## 2016-02-03 LAB — CBC
HCT: 34.3 % — ABNORMAL LOW (ref 36.0–46.0)
HEMOGLOBIN: 11.1 g/dL — AB (ref 12.0–15.0)
MCH: 26.3 pg (ref 26.0–34.0)
MCHC: 32.4 g/dL (ref 30.0–36.0)
MCV: 81.3 fL (ref 78.0–100.0)
Platelets: 188 10*3/uL (ref 150–400)
RBC: 4.22 MIL/uL (ref 3.87–5.11)
RDW: 14.9 % (ref 11.5–15.5)
WBC: 9.9 10*3/uL (ref 4.0–10.5)

## 2016-02-03 LAB — PROTIME-INR
INR: 0.94
INR: 0.98
PROTHROMBIN TIME: 12.5 s (ref 11.4–15.2)
Prothrombin Time: 13 seconds (ref 11.4–15.2)

## 2016-02-03 LAB — GLUCOSE, CAPILLARY
GLUCOSE-CAPILLARY: 148 mg/dL — AB (ref 65–99)
Glucose-Capillary: 103 mg/dL — ABNORMAL HIGH (ref 65–99)

## 2016-02-03 LAB — APTT: APTT: 29 s (ref 24–36)

## 2016-02-03 MED ORDER — MIDAZOLAM HCL 2 MG/2ML IJ SOLN
INTRAMUSCULAR | Status: AC
  Filled 2016-02-03: qty 2

## 2016-02-03 MED ORDER — LIDOCAINE HCL (PF) 1 % IJ SOLN
INTRAMUSCULAR | Status: AC
  Filled 2016-02-03: qty 10

## 2016-02-03 MED ORDER — SODIUM CHLORIDE 0.9 % IV SOLN
INTRAVENOUS | Status: DC
Start: 2016-02-03 — End: 2016-02-04

## 2016-02-03 MED ORDER — FENTANYL CITRATE (PF) 100 MCG/2ML IJ SOLN
INTRAMUSCULAR | Status: AC | PRN
  Administered 2016-02-03: 50 ug via INTRAVENOUS

## 2016-02-03 MED ORDER — FENTANYL CITRATE (PF) 100 MCG/2ML IJ SOLN
INTRAMUSCULAR | Status: AC
  Filled 2016-02-03: qty 2

## 2016-02-03 MED ORDER — MIDAZOLAM HCL 2 MG/2ML IJ SOLN
INTRAMUSCULAR | Status: AC | PRN
  Administered 2016-02-03: 1 mg via INTRAVENOUS

## 2016-02-03 NOTE — Sedation Documentation (Signed)
Patient is resting comfortably. 

## 2016-02-03 NOTE — Procedures (Signed)
Random liver biopsy No comp/EBL

## 2016-02-03 NOTE — Discharge Instructions (Signed)
Liver Biopsy, Care After °Introduction °These instructions give you information on caring for yourself after your procedure. Your doctor may also give you more specific instructions. Call your doctor if you have any problems or questions after your procedure. °Follow these instructions at home: °· Rest at home for 1-2 days or as told by your doctor. °· Have someone stay with you for at least 24 hours. °· Do not do these things in the first 24 hours: °¨ Drive. °¨ Use machinery. °¨ Take care of other people. °¨ Sign legal documents. °¨ Take a bath or shower. °· There are many different ways to close and cover a cut (incision). For example, a cut can be closed with stitches, skin glue, or adhesive strips. Follow your doctor's instructions on: °¨ Taking care of your cut. °¨ Changing and removing your bandage (dressing). °¨ Removing whatever was used to close your cut. °· Do not drink alcohol in the first week. °· Do not lift more than 5 pounds or play contact sports for the first 2 weeks. °· Take medicines only as told by your doctor. For 1 week, do not take medicine that has aspirin in it or medicines like ibuprofen. °· Get your test results. °Contact a doctor if: °· A cut bleeds and leaves more than just a small spot of blood. °· A cut is red, puffs up (swells), or hurts more than before. °· Fluid or something else comes from a cut. °· A cut smells bad. °· You have a fever or chills. °Get help right away if: °· You have swelling, bloating, or pain in your belly (abdomen). °· You get dizzy or faint. °· You have a rash. °· You feel sick to your stomach (nauseous) or throw up (vomit). °· You have trouble breathing, feel short of breath, or feel faint. °· Your chest hurts. °· You have problems talking or seeing. °· You have trouble balancing or moving your arms or legs. °This information is not intended to replace advice given to you by your health care provider. Make sure you discuss any questions you have with your  health care provider. °Document Released: 11/12/2007 Document Revised: 07/11/2015 Document Reviewed: 03/31/2013 °© 2017 Elsevier ° °

## 2016-02-03 NOTE — H&P (Signed)
Chief Complaint: Patient was seen in consultation today for random liver core biopsy at the request of Annitta Needs  Referring Physician(s): Annitta Needs  Supervising Physician: Marybelle Killings  Patient Status: [facility] - Out-pt  History of Present Illness: Stephanie [name] is a 65 y.o. female   Pt has been "sick" for 1 yr. N/V; abd pain Weight loss Has had procedures and check ups and screenings. Endoscopy and colonoscopy 01/06/2016 All negative except few diverticulitis  Noted persistent elevation in lever function tests and now is scheduled for random liver core biopsy Per Dr Gala Romney  Last dose Eliquis 12/15   Past Medical History:  Diagnosis Date  . Anemia   . Arthritis   . Asthma   . COPD (chronic obstructive pulmonary disease) ([place])   . Deep vein thrombosis (DVT) of both lower extremities ([place]) 06/27/2015  . Diabetes mellitus   . Fibromyalgia   . GERD (gastroesophageal reflux disease)   . H/O hiatal hernia   . Hypercholesteremia   . Hypertension   . Hyperthyroidism   . IBS (irritable bowel syndrome)   . Inner ear disease   . 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 PONV (postoperative nausea and vomiting)   . Tachycardia     Past Surgical History:  Procedure Laterality Date  . ABDOMINAL HYSTERECTOMY  partial  . CARPAL TUNNEL RELEASE Right 1991  . CATARACT EXTRACTION W/PHACO Right 05/08/2013   Procedure: CATARACT EXTRACTION PHACO AND INTRAOCULAR LENS PLACEMENT (IOC);  Surgeon: Tonny Branch, MD;  Location: [facility] ORS;  Service: Ophthalmology;  Laterality: Right;  CDE 10.31  . CATARACT EXTRACTION W/PHACO Left 08/17/2013   Procedure: CATARACT EXTRACTION PHACO AND INTRAOCULAR LENS PLACEMENT (IOC);  Surgeon: Tonny Branch, MD;  Location: [facility] ORS;  Service: Ophthalmology;  Laterality: Left;  CDE:9.03  . CHOLECYSTECTOMY    . COLONOSCOPY WITH PROPOFOL N/A 01/06/2016   Procedure: COLONOSCOPY WITH PROPOFOL;  Surgeon: Daneil Dolin, MD;  Location: [facility] ENDO SUITE;   Service: Endoscopy;  Laterality: N/A;  12:30 pm  . DENTAL SURGERY    . ESOPHAGOGASTRODUODENOSCOPY (EGD) WITH PROPOFOL N/A 01/06/2016   Procedure: ESOPHAGOGASTRODUODENOSCOPY (EGD) WITH PROPOFOL;  Surgeon: Daneil Dolin, MD;  Location: [facility] SU[facility];  Service: Endoscopy;  Laterality: N/A;  . Venia Minks DILATION N/A 01/06/2016   Procedure: Venia Minks DILATION;  Surgeon: Daneil Dolin, MD;  Location: [facility] END[facility];  Service: Endoscopy;  Laterality: N/A;  . WRIST GANGLION EXCISION Left     Allergies: Tetracyclines & related; Banana; and Penicillins  Medications: Prior to Admission medications   Medication Sig Start Date End Date Taking? Authorizing Provider  albuterol (PROVENTIL HFA;VENTOLIN HFA) 108 (90 BASE) MCG/ACT inhaler Inhale 2 puffs into the lungs every 6 (six) hours as needed for wheezing or shortness of breath.   Yes Historical Provider, MD  albuterol (PROVENTIL) (2.5 MG/3ML) 0.083% nebulizer solution Take 2.5 mg by nebulization every 6 (six) hours as needed for wheezing or shortness of breath.   Yes Historical Provider, MD  apixaban (ELIQUIS) 5 MG TABS tablet Take 10 mg by mouth BID through Friday 5/19 evening. Take 5mg  by mouth BID starting 5/20 AM. Patient taking differently: Take 5 mg by mouth 2 (two) times daily.  07/25/15  Yes Lendon Colonel, NP  [place], Vaccinium myrtillus, 100 MG CAPS Take 1 capsule by mouth 2 (two) times daily.   Yes Historical Provider, MD  cetirizine (ZYRTEC) 10 MG tablet Take 10 mg by mouth daily.   Yes Historical Provider, MD  Cholecalciferol (  VITAMIN D3) 5000 units TABS Take 5,000 Units by mouth daily.    Yes Historical Provider, MD  CINNAMON PO Take 2-4 capsules by mouth 2 (two) times daily as needed (high blood sugar).    Yes Historical Provider, MD  clobetasol ointment (TEMOVATE) [id number] % Apply 1 application topically 2 (two) times daily as needed (rash).  06/20/15  Yes Historical Provider, MD  Cod Liver Oil CAPS Take 1 capsule by mouth daily.   Yes  Historical Provider, MD  Cyanocobalamin (VITAMIN B-12 PO) Take 1 tablet by mouth daily.     Yes Historical Provider, MD  DULoxetine (CYMBALTA) 30 MG capsule Take 30 mg by mouth 2 (two) times daily.  06/20/15  Yes Historical Provider, MD  HUMALOG KWIKPEN 100 UNIT/ML KiwkPen Inject 30 Units into the skin 3 (three) times daily.  11/15/15  Yes Historical Provider, MD  hydrOXYzine (ATARAX/VISTARIL) 25 MG tablet Take 25 mg by mouth every 8 (eight) hours as needed for itching.  06/20/15  Yes Historical Provider, MD  Insulin Glargine (LANTUS SOLOSTAR) 100 UNIT/ML Solostar Pen Inject 75 Units into the skin 2 (two) times daily.   Yes Historical Provider, MD  levothyroxine (SYNTHROID, LEVOTHROID) 100 MCG tablet Take 100 mcg by mouth daily before breakfast.  11/23/13  Yes Historical Provider, MD  linaclotide (LINZESS) 145 MCG CAPS capsule Take 1 capsule (145 mcg total) by mouth daily before breakfast. 11/01/15  Yes Mahala Menghini, PA-C  linagliptin (TRADJENTA) 5 MG TABS tablet Take 5 mg by mouth daily.   Yes Historical Provider, MD  LYRICA 75 MG capsule Take 75 mg by mouth 2 (two) times daily.  07/18/15  Yes Historical Provider, MD  Melatonin 5 MG CAPS Take 5-10 mg by mouth at bedtime.    Yes Historical Provider, MD  metolazone (ZAROXOLYN) 5 MG tablet Take 1 tablet (5 mg total) by mouth 2 (two) times daily. 07/02/15  Yes Samuella Cota, MD  metoprolol (LOPRESSOR) 100 MG tablet TAKE 1 TABLET BY MOUTH TWICE DAILY **TAKE WITH 25MG  TABLET FOR TOTAL DOSAGE OF 125MG ** 06/25/15  Yes Lendon Colonel, NP  metoprolol tartrate (LOPRESSOR) 25 MG tablet Take 25 mg by mouth 2 (two) times daily.   Yes Historical Provider, MD  mometasone (ELOCON) 0.1 % cream Apply 1 application topically daily as needed (rash).  12/16/15  Yes Historical Provider, MD  mupirocin ointment (BACTROBAN) 2 % Apply 1 application topically 2 (two) times daily as needed (irritation).  06/20/15  Yes Historical Provider, MD  pantoprazole (PROTONIX) 40 MG tablet  Take 40 mg by mouth daily.  06/21/15  Yes Historical Provider, MD  pravastatin (PRAVACHOL) 40 MG tablet Take 60 mg by mouth every evening.  06/20/15  Yes Historical Provider, MD  prednisoLONE acetate (PRED FORTE) 1 % ophthalmic suspension 4 drops 2 (two) times daily as needed. Use for left ear pain   Yes Historical Provider, MD  PULMICORT FLEXHALER 180 MCG/ACT inhaler Inhale 2 puffs into the lungs daily as needed (for shortness of breath-wheezing).  06/20/15  Yes Historical Provider, MD  torsemide (DEMADEX) 20 MG tablet Take 2 tablets (40 mg total) by mouth daily. Patient taking differently: Take 20 mg by mouth daily. May take an additional  20mg s as needed for swelling 07/02/15  Yes Samuella Cota, MD  traMADol (ULTRAM) 50 MG tablet Take 1 tablet (50 mg total) by mouth every 6 (six) hours as needed. Patient taking differently: Take 50 mg by mouth 2 (two) times daily.  05/27/15  Yes [name], MD  vitamin C (ASCORBIC ACID) 500 MG tablet Take 1,000 mg by mouth daily.   Yes Historical Provider, MD  vitamin E 400 UNIT capsule Take 400 Units by mouth daily.   Yes Historical Provider, MD  polyethylene glycol-electrolytes (TRILYTE) 420 g solution Take 4,000 mLs by mouth as directed. 12/16/15   Daneil Dolin, MD     Family History  Problem Relation Age of Onset  . Colon cancer Neg Hx     Social History   Social History  . Marital status: Married    Spouse name: N/A  . Number of children: N/A  . Years of education: N/A   Social History Main Topics  . Smoking status: Former Smoker    Packs/day: 0.25    Years: 30.00    Types: Cigarettes    Quit date: 02/17/2014  . Smokeless tobacco: Never Used     Comment: some day smoker  . Alcohol use No  . Drug use: No  . Sexual activity: Yes    Birth control/ protection: Surgical   Other Topics Concern  . None   Social History Narrative  . None    Review of Systems: A 12 point ROS discussed and pertinent positives are indicated in the HPI above.   All other systems are negative.  Review of Systems  Constitutional: Positive for activity change, appetite change, fatigue and unexpected weight change. Negative for fever.  Respiratory: Negative for cough and shortness of breath.   Cardiovascular: Negative for chest pain.  Gastrointestinal: Positive for abdominal pain.  Neurological: Positive for weakness.  Psychiatric/Behavioral: Negative for behavioral problems and confusion.    Vital Signs: BP (!) 156/65   Pulse 81   Temp 98.3 F (36.8 C) (Oral)   Resp 18   Ht 5' 7.5" (1.715 m)   Wt 225 lb (102.1 kg)   SpO2 100%   BMI 34.72 kg/m   Physical Exam  Constitutional: She is oriented to person, place, and time. She appears well-nourished.  Cardiovascular: Normal rate, regular rhythm and normal heart sounds.   Pulmonary/Chest: Effort normal and breath sounds normal.  Abdominal: Soft. Bowel sounds are normal. There is no tenderness.  Musculoskeletal: Normal range of motion.  Neurological: She is alert and oriented to person, place, and time.  Skin: Skin is warm and dry.  Psychiatric: She has a normal mood and affect. Her behavior is normal. Thought content normal.  Nursing note and vitals reviewed.   Mallampati Score:  MD Evaluation Airway: WNL Heart: WNL Abdomen: WNL Chest/ Lungs: WNL ASA  Classification: 2 Mallampati/Airway Score: One  Imaging: No results found.  Labs:  CBC:  Recent Labs  08/05/15 1003 09/03/15 0730 09/12/15 1111 12/24/15 0842  WBC 11.9* 10.3 9.3 14.7*  HGB 10.3* 10.4* 10.2* 11.1*  HCT 33.1* 31.4* 30.5* 34.9*  PLT 271 219 210 259    COAGS:  Recent Labs  06/27/15 2020 09/03/15 0730  INR 1.01 1.21  APTT 30 28    BMP:  Recent Labs  08/05/15 1001 09/12/15 1111 12/24/15 0842 01/03/16 1325  NA 133* 133* 134* 139  K 3.8 4.2 3.9 3.7  CL 102 103 101 106  CO2 24 25 25 25   GLUCOSE 314* 276* 276* 152*  BUN 36* 43* 38* 20  CALCIUM 8.9 8.5* 9.0 9.6  CREATININE 1.35* 1.69* 2.24*  1.02*  GFRNONAA 41* 31* 22* 56*  GFRAA 47* 36* 25* >60    LIVER FUNCTION TESTS:  Recent Labs  08/05/15 1001 09/12/15 1111 12/16/15 1625 12/24/15 [id number]  01/03/16 1325  BILITOT 0.5 0.3 0.2 0.4  --   AST 29 18 42* 41  --   ALT 37 30 66* 58*  --   ALKPHOS 293* 182* 336* 306*  --   PROT 8.1 7.9 8.1 8.2*  --   ALBUMIN 2.3* 2.8* 3.7 3.2* 3.3*    TUMOR MARKERS: No results for input(s): AFPTM, CEA, CA199, CHROMGRNA in the last 8760 hours.  Assessment and Plan:  Persistent elevated LFTs Now scheduled for random liver core biopsy Risks and Benefits discussed with the patient including, but not limited to bleeding, infection, damage to adjacent structures or low yield requiring additional tests. All of the patient's questions were answered, patient is agreeable to proceed. Consent signed and in chart.  Thank you for this interesting consult.  I greatly enjoyed meeting Kenlei Orefice Bogucki and look forward to participating in their care.  A copy of this report was sent to the requesting provider on this date.  Electronically Signed: Khloey Chern A 02/03/2016, 11:43 AM   I spent a total of  30 Minutes   in face to face in clinical consultation, greater than 50% of which was counseling/coordinating care for liver core biopsy

## 2016-02-19 NOTE — Progress Notes (Signed)
Surgical pathology reviewed from liver biopsy. She had mild chronic hepatitis, mild fatty liver, NO significant fibrosis, non-specific findings. Likely secondary to drug-induced etiology. Is she taking any Ibuprofen or OTC agents? Likely mildly elevated ASMA is non-specific. Recommend repeat HFP in 3 months. How is she doing overall?

## 2016-02-24 ENCOUNTER — Ambulatory Visit (HOSPITAL_COMMUNITY): Payer: Medicare Other | Admitting: Hematology & Oncology

## 2016-02-24 ENCOUNTER — Other Ambulatory Visit (HOSPITAL_COMMUNITY): Payer: Medicare Other

## 2016-02-26 ENCOUNTER — Other Ambulatory Visit: Payer: Self-pay | Admitting: Gastroenterology

## 2016-02-26 DIAGNOSIS — R748 Abnormal levels of other serum enzymes: Secondary | ICD-10-CM

## 2016-03-09 ENCOUNTER — Other Ambulatory Visit (HOSPITAL_COMMUNITY)
Admission: RE | Admit: 2016-03-09 | Discharge: 2016-03-09 | Disposition: A | Discharge status: Home / Self Care | Payer: Medicare Other | Source: Ambulatory Visit | Attending: Nephrology | Admitting: Nephrology

## 2016-03-09 DIAGNOSIS — D509 Iron deficiency anemia, unspecified: Secondary | ICD-10-CM | POA: Diagnosis not present

## 2016-03-09 DIAGNOSIS — E559 Vitamin D deficiency, unspecified: Secondary | ICD-10-CM | POA: Diagnosis not present

## 2016-03-09 DIAGNOSIS — I10 Essential (primary) hypertension: Secondary | ICD-10-CM | POA: Diagnosis not present

## 2016-03-09 DIAGNOSIS — R809 Proteinuria, unspecified: Secondary | ICD-10-CM | POA: Diagnosis not present

## 2016-03-09 DIAGNOSIS — Z79899 Other long term (current) drug therapy: Secondary | ICD-10-CM | POA: Diagnosis not present

## 2016-03-09 DIAGNOSIS — N183 Chronic kidney disease, stage 3 (moderate): Secondary | ICD-10-CM | POA: Insufficient documentation

## 2016-03-09 LAB — RENAL FUNCTION PANEL
Albumin: 3.1 g/dL — ABNORMAL LOW (ref 3.5–5.0)
Anion gap: 8 (ref 5–15)
BUN: 18 mg/dL (ref 6–20)
CHLORIDE: 102 mmol/L (ref 101–111)
CO2: 25 mmol/L (ref 22–32)
CREATININE: 1.97 mg/dL — AB (ref 0.44–1.00)
Calcium: 8.9 mg/dL (ref 8.9–10.3)
GFR calc Af Amer: 30 mL/min — ABNORMAL LOW (ref >60)
GFR, EST NON AFRICAN AMERICAN: 25 mL/min — AB (ref >60)
Glucose, Bld: 136 mg/dL — ABNORMAL HIGH (ref 65–99)
Phosphorus: 3.9 mg/dL (ref 2.5–4.6)
Potassium: 3.6 mmol/L (ref 3.5–5.1)
SODIUM: 135 mmol/L (ref 135–145)

## 2016-03-09 LAB — IRON AND TIBC
Iron: 59 ug/dL (ref 28–170)
SATURATION RATIOS: 22 % (ref 10.4–31.8)
TIBC: 263 ug/dL (ref 250–450)
UIBC: 204 ug/dL

## 2016-03-09 LAB — HEMOGLOBIN AND HEMATOCRIT, BLOOD
HCT: 34 % — ABNORMAL LOW (ref 36.0–46.0)
HEMOGLOBIN: 11.2 g/dL — AB (ref 12.0–15.0)

## 2016-03-09 LAB — FERRITIN: Ferritin: 142 ng/mL (ref 11–307)

## 2016-03-10 LAB — VITAMIN D 25 HYDROXY (VIT D DEFICIENCY, FRACTURES): VIT D 25 HYDROXY: 26.9 ng/mL — AB (ref 30.0–100.0)

## 2016-03-10 LAB — PTH, INTACT AND CALCIUM
CALCIUM TOTAL (PTH): 8.5 mg/dL — AB (ref 8.7–10.3)
PTH: 20 pg/mL (ref 15–65)

## 2016-03-16 ENCOUNTER — Ambulatory Visit (INDEPENDENT_AMBULATORY_CARE_PROVIDER_SITE_OTHER): Payer: Medicare Other | Admitting: Otolaryngology

## 2016-03-16 DIAGNOSIS — H903 Sensorineural hearing loss, bilateral: Secondary | ICD-10-CM

## 2016-03-30 ENCOUNTER — Encounter (HOSPITAL_COMMUNITY): Payer: Medicare Other

## 2016-03-30 ENCOUNTER — Other Ambulatory Visit (HOSPITAL_COMMUNITY)
Admission: RE | Admit: 2016-03-30 | Discharge: 2016-03-30 | Disposition: A | Discharge status: Home / Self Care | Payer: Medicare Other | Source: Ambulatory Visit | Attending: Internal Medicine | Admitting: Internal Medicine

## 2016-03-30 ENCOUNTER — Encounter (HOSPITAL_COMMUNITY): Payer: Self-pay

## 2016-03-30 ENCOUNTER — Telehealth (HOSPITAL_COMMUNITY): Payer: Self-pay | Admitting: Endocrinology

## 2016-03-30 ENCOUNTER — Encounter (HOSPITAL_COMMUNITY): Payer: Medicare Other | Attending: Oncology | Admitting: Oncology

## 2016-03-30 VITALS — BP 123/72 | HR 110 | Temp 99.1°F | Resp 20 | Wt 229.1 lb

## 2016-03-30 DIAGNOSIS — D649 Anemia, unspecified: Secondary | ICD-10-CM

## 2016-03-30 DIAGNOSIS — Z7901 Long term (current) use of anticoagulants: Secondary | ICD-10-CM

## 2016-03-30 DIAGNOSIS — D472 Monoclonal gammopathy: Secondary | ICD-10-CM | POA: Insufficient documentation

## 2016-03-30 DIAGNOSIS — N183 Chronic kidney disease, stage 3 (moderate): Secondary | ICD-10-CM | POA: Diagnosis not present

## 2016-03-30 DIAGNOSIS — I82403 Acute embolism and thrombosis of unspecified deep veins of lower extremity, bilateral: Secondary | ICD-10-CM

## 2016-03-30 LAB — CBC WITH DIFFERENTIAL/PLATELET
BASOS ABS: 0 10*3/uL (ref 0.0–0.1)
Basophils Relative: 0 %
Eosinophils Absolute: 0.4 10*3/uL (ref 0.0–0.7)
Eosinophils Relative: 3 %
HEMATOCRIT: 35.7 % — AB (ref 36.0–46.0)
Hemoglobin: 11.7 g/dL — ABNORMAL LOW (ref 12.0–15.0)
LYMPHS ABS: 4.6 10*3/uL — AB (ref 0.7–4.0)
LYMPHS PCT: 36 %
MCH: 26.8 pg (ref 26.0–34.0)
MCHC: 32.8 g/dL (ref 30.0–36.0)
MCV: 81.9 fL (ref 78.0–100.0)
MONO ABS: 0.9 10*3/uL (ref 0.1–1.0)
Monocytes Relative: 7 %
NEUTROS ABS: 6.9 10*3/uL (ref 1.7–7.7)
Neutrophils Relative %: 54 %
Platelets: 251 10*3/uL (ref 150–400)
RBC: 4.36 MIL/uL (ref 3.87–5.11)
RDW: 14.5 % (ref 11.5–15.5)
WBC: 12.8 10*3/uL — ABNORMAL HIGH (ref 4.0–10.5)

## 2016-03-30 LAB — COMPREHENSIVE METABOLIC PANEL
ALT: 34 U/L (ref 14–54)
AST: 29 U/L (ref 15–41)
Albumin: 3.3 g/dL — ABNORMAL LOW (ref 3.5–5.0)
Alkaline Phosphatase: 193 U/L — ABNORMAL HIGH (ref 38–126)
Anion gap: 9 (ref 5–15)
BILIRUBIN TOTAL: 0.4 mg/dL (ref 0.3–1.2)
BUN: 24 mg/dL — AB (ref 6–20)
CALCIUM: 9.2 mg/dL (ref 8.9–10.3)
CO2: 28 mmol/L (ref 22–32)
CREATININE: 1.26 mg/dL — AB (ref 0.44–1.00)
Chloride: 99 mmol/L — ABNORMAL LOW (ref 101–111)
GFR calc Af Amer: 51 mL/min — ABNORMAL LOW (ref >60)
GFR, EST NON AFRICAN AMERICAN: 44 mL/min — AB (ref >60)
Glucose, Bld: 174 mg/dL — ABNORMAL HIGH (ref 65–99)
Potassium: 3.5 mmol/L (ref 3.5–5.1)
Sodium: 136 mmol/L (ref 135–145)
TOTAL PROTEIN: 8.2 g/dL — AB (ref 6.5–8.1)

## 2016-03-30 NOTE — Progress Notes (Signed)
HEMATOLOGY/ONCOLOGY PROGRESS NOTE  Date of Service: 03/30/2016  Patient Care Team: Jani Gravel, MD as PCP - General (Internal Medicine) Herminio Commons, MD as Attending Physician (Cardiology) Daneil Dolin, MD as Consulting Physician (Gastroenterology) Dr Lowanda Foster MD (nephrology) PCP: Kevan Ny MD  CHIEF COMPLAINTS/PURPOSE OF CONSULTATION:  M spike on peripheral blood and nephrotic range proteinuria Anemia Bilateral DVT  HISTORY OF PRESENTING ILLNESS:  Stephanie [name] is a wonderful 66 y.o. female who returns for follow up of M spike in peripheral blood associated with nephrotic range proteinuria to rule out a plasma cell dyscrasia.  Patient has a history of hypertension, diabetes, fibromyalgia, hypothyroidism (on thyroid replacement), COPD ex-smoker quit in December 2016, irritable bowel syndrome with had issues with increasing left lower extremity swelling for about a year on and off.   She has been doing okay. She had a liver biopsy due to elevated liver enzymes. She is being monitored by a gastroenterologist for this. She hasn't been sick recently, but she does have allergies. She has some SOB and joint pain from arthritis since she stopped taking her diclofenac due to the liver enzymes. Dr. Aline Brochure, who follows her arthritis, is unaware that she has stopped taking her pain medication. Denies any other concerns.   MEDICAL HISTORY:  Past Medical History:  Diagnosis Date  . Anemia   . Arthritis   . Asthma   . COPD (chronic obstructive pulmonary disease) ([place])   . Deep vein thrombosis (DVT) of both lower extremities ([place]) 06/27/2015  . Diabetes mellitus   . Fibromyalgia   . GERD (gastroesophageal reflux disease)   . H/O hiatal hernia   . Hypercholesteremia   . Hypertension   . Hyperthyroidism   . IBS (irritable bowel syndrome)   . Inner ear disease   . 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 PONV (postoperative nausea and vomiting)   . Tachycardia      SURGICAL HISTORY: Past Surgical History:  Procedure Laterality Date  . ABDOMINAL HYSTERECTOMY  partial  . CARPAL TUNNEL RELEASE Right 1991  . CATARACT EXTRACTION W/PHACO Right 05/08/2013   Procedure: CATARACT EXTRACTION PHACO AND INTRAOCULAR LENS PLACEMENT (IOC);  Surgeon: Tonny Branch, MD;  Location: [facility];  Service: Ophthalmology;  Laterality: Right;  CDE 10.31  . CATARACT EXTRACTION W/PHACO Left 08/17/2013   Procedure: CATARACT EXTRACTION PHACO AND INTRAOCULAR LENS PLACEMENT (IOC);  Surgeon: Tonny Branch, MD;  Location: [facility];  Service: Ophthalmology;  Laterality: Left;  CDE:9.03  . CHOLECYSTECTOMY    . COLONOSCOPY WITH PROPOFOL N/A 01/06/2016   Procedure: COLONOSCOPY WITH PROPOFOL;  Surgeon: Daneil Dolin, MD;  Location: [facility];  Service: Endoscopy;  Laterality: N/A;  12:30 pm  . DENTAL SURGERY    . ESOPHAGOGASTRODUODENOSCOPY (EGD) WITH PROPOFOL N/A 01/06/2016   Procedure: ESOPHAGOGASTRODUODENOSCOPY (EGD) WITH PROPOFOL;  Surgeon: Daneil Dolin, MD;  Location: [facility];  Service: Endoscopy;  Laterality: N/A;  . Venia Minks DILATION N/A 01/06/2016   Procedure: Venia Minks DILATION;  Surgeon: Daneil Dolin, MD;  Location: [facility];  Service: Endoscopy;  Laterality: N/A;  . WRIST GANGLION EXCISION Left     SOCIAL HISTORY: Social History   Social History  . Marital status: Married    Spouse name: N/A  . Number of children: N/A  . Years of education: N/A   Occupational History  . Not on file.   Social History Main Topics  . Smoking status: Former Smoker    Packs/day: 0.25    Years: 30.00  Types: Cigarettes    Quit date: 02/17/2014  . Smokeless tobacco: Never Used     Comment: some day smoker  . Alcohol use No  . Drug use: No  . Sexual activity: No   Other Topics Concern  . Not on file   Social History Narrative  . No narrative on file    FAMILY HISTORY: Family History  Problem Relation Age of Onset  . Colon cancer Neg Hx     ALLERGIES:  is allergic  to tetracyclines & related; banana; and penicillins.  MEDICATIONS:  Current Outpatient Prescriptions  Medication Sig Dispense Refill  . albuterol (PROVENTIL HFA;VENTOLIN HFA) 108 (90 BASE) MCG/ACT inhaler Inhale 2 puffs into the lungs every 6 (six) hours as needed for wheezing or shortness of breath.    Marland Kitchen albuterol (PROVENTIL) (2.5 MG/3ML) 0.083% nebulizer solution Take 2.5 mg by nebulization every 6 (six) hours as needed for wheezing or shortness of breath.    Marland Kitchen apixaban (ELIQUIS) 5 MG TABS tablet Take 10 mg by mouth BID through Friday 5/19 evening. Take 15m by mouth BID starting 5/20 AM. (Patient taking differently: Take 5 mg by mouth 2 (two) times daily. ) 60 tablet 3  . Bilberry, Vaccinium myrtillus, 100 MG CAPS Take 1 capsule by mouth 2 (two) times daily.    . cetirizine (ZYRTEC) 10 MG tablet Take 10 mg by mouth daily.    . Cholecalciferol (VITAMIN D3) 5000 units TABS Take 5,000 Units by mouth daily.     .Marland KitchenCINNAMON PO Take 2-4 capsules by mouth 2 (two) times daily as needed (high blood sugar).     . clobetasol ointment (TEMOVATE) 06.22% Apply 1 application topically 2 (two) times daily as needed (rash).     .Marland KitchenCod Liver Oil CAPS Take 1 capsule by mouth daily.    . Cyanocobalamin (VITAMIN B-12 PO) Take 1 tablet by mouth daily.      . DULoxetine (CYMBALTA) 30 MG capsule Take 30 mg by mouth 2 (two) times daily.     .Marland KitchenHUMALOG KWIKPEN 100 UNIT/ML KiwkPen Inject 30 Units into the skin 3 (three) times daily.     . hydrOXYzine (ATARAX/VISTARIL) 25 MG tablet Take 25 mg by mouth every 8 (eight) hours as needed for itching.     . Insulin Glargine (LANTUS SOLOSTAR) 100 UNIT/ML Solostar Pen Inject 75 Units into the skin 2 (two) times daily.    .Marland Kitchenlevothyroxine (SYNTHROID, LEVOTHROID) 100 MCG tablet Take 100 mcg by mouth daily before breakfast.   0  . linaclotide (LINZESS) 145 MCG CAPS capsule Take 1 capsule (145 mcg total) by mouth daily before breakfast. 30 capsule 11  . linagliptin (TRADJENTA) 5 MG TABS  tablet Take 5 mg by mouth daily.    .Marland KitchenLYRICA 75 MG capsule Take 75 mg by mouth 2 (two) times daily.     . Melatonin 5 MG CAPS Take 5-10 mg by mouth at bedtime.     . metolazone (ZAROXOLYN) 5 MG tablet Take 1 tablet (5 mg total) by mouth 2 (two) times daily. 60 tablet 0  . metoprolol (LOPRESSOR) 100 MG tablet TAKE 1 TABLET BY MOUTH TWICE DAILY **TAKE WITH 25MG TABLET FOR TOTAL DOSAGE OF 125MG** 180 tablet 1  . metoprolol tartrate (LOPRESSOR) 25 MG tablet Take 25 mg by mouth 2 (two) times daily.    . mometasone (ELOCON) 0.1 % cream Apply 1 application topically daily as needed (rash).     . mupirocin ointment (BACTROBAN) 2 % Apply 1 application topically  2 (two) times daily as needed (irritation).     . pantoprazole (PROTONIX) 40 MG tablet Take 40 mg by mouth daily.     . polyethylene glycol-electrolytes (TRILYTE) 420 g solution Take 4,000 mLs by mouth as directed. 4000 mL 0  . pravastatin (PRAVACHOL) 40 MG tablet Take 60 mg by mouth every evening.     . prednisoLONE acetate (PRED FORTE) 1 % ophthalmic suspension 4 drops 2 (two) times daily as needed. Use for left ear pain    . PULMICORT FLEXHALER 180 MCG/ACT inhaler Inhale 2 puffs into the lungs daily as needed (for shortness of breath-wheezing).     . torsemide (DEMADEX) 20 MG tablet Take 2 tablets (40 mg total) by mouth daily. (Patient taking differently: Take 20 mg by mouth daily. May take an additional  20ms as needed for swelling) 60 tablet 0  . traMADol (ULTRAM) 50 MG tablet Take 1 tablet (50 mg total) by mouth every 6 (six) hours as needed. (Patient taking differently: Take 50 mg by mouth 2 (two) times daily. ) 16 tablet 0  . vitamin C (ASCORBIC ACID) 500 MG tablet Take 1,000 mg by mouth daily.    . vitamin E 400 UNIT capsule Take 400 Units by mouth daily.     No current facility-administered medications for this visit.     REVIEW OF SYSTEMS: Review of Systems  Constitutional: Negative.   HENT: Negative.   Eyes: Negative.     Respiratory: Positive for shortness of breath.   Cardiovascular: Negative.   Gastrointestinal: Positive for abdominal pain (when palpated).  Genitourinary: Negative.   Musculoskeletal: Positive for joint pain (arthritis).  Skin: Negative.   Neurological: Negative.   Endo/Heme/Allergies: Negative.   Psychiatric/Behavioral: Negative.   All other systems reviewed and are negative. 14 point review of systems was performed and is negative except as detailed under history of present illness and above  PHYSICAL EXAMINATION: ECOG PERFORMANCE STATUS: 2 - Symptomatic, <50% confined to bed  BP 123/72 (BP Location: Left Arm, Patient Position: Sitting)   Pulse (!) 110   Temp 99.1 F (37.3 C) (Oral)   Resp 20   Wt 229 lb 1.6 oz (103.9 kg)   SpO2 100%   BMI 35.35 kg/m   Physical Exam  Constitutional: She is oriented to person, place, and time and well-developed, well-nourished, and in no distress.  HENT:  Head: Normocephalic and atraumatic.  Eyes: EOM are normal. Pupils are equal, round, and reactive to light.  Neck: Normal range of motion. Neck supple.  Cardiovascular: Normal rate, regular rhythm and normal heart sounds.   Pulmonary/Chest: Effort normal and breath sounds normal.  Abdominal: Soft. Bowel sounds are normal. She exhibits no distension and no mass. There is no tenderness. There is no rebound and no guarding.  Musculoskeletal: Normal range of motion.  Neurological: She is alert and oriented to person, place, and time. Gait normal.  Skin: Skin is warm and dry.  Nursing note and vitals reviewed.  LABORATORY DATA:  I have reviewed the data as listed  . CBC Latest Ref Rng & Units 03/30/2016 03/09/2016 02/03/2016  WBC 4.0 - 10.5 K/uL 12.8(H) - 9.9  Hemoglobin 12.0 - 15.0 g/dL 11.7(L) 11.2(L) 11.1(L)  Hematocrit 36.0 - 46.0 % 35.7(L) 34.0(L) 34.3(L)  Platelets 150 - 400 K/uL 251 - 188   . CBC    Component Value Date/Time   WBC 12.8 (H) 03/30/2016 1110   RBC 4.36  03/30/2016 1110   HGB 11.7 (L) 03/30/2016 1110   HCT  35.7 (L) 03/30/2016 1110   PLT 251 03/30/2016 1110   MCV 81.9 03/30/2016 1110   MCH 26.8 03/30/2016 1110   MCHC 32.8 03/30/2016 1110   RDW 14.5 03/30/2016 1110   LYMPHSABS 4.6 (H) 03/30/2016 1110   MONOABS 0.9 03/30/2016 1110   EOSABS 0.4 03/30/2016 1110   BASOSABS 0.0 03/30/2016 1110    . CMP Latest Ref Rng & Units 03/30/2016 03/09/2016 03/09/2016  Glucose 65 - 99 mg/dL 174(H) 136(H) -  BUN 6 - 20 mg/dL 24(H) 18 -  Creatinine 0.44 - 1.00 mg/dL 1.26(H) 1.97(H) -  Sodium 135 - 145 mmol/L 136 135 -  Potassium 3.5 - 5.1 mmol/L 3.5 3.6 -  Chloride 101 - 111 mmol/L 99(L) 102 -  CO2 22 - 32 mmol/L 28 25 -  Calcium 8.9 - 10.3 mg/dL 9.2 8.9 8.5(L)  Total Protein 6.5 - 8.1 g/dL 8.2(H) - -  Total Bilirubin 0.3 - 1.2 mg/dL 0.4 - -  Alkaline Phos 38 - 126 U/L 193(H) - -  AST 15 - 41 U/L 29 - -  ALT 14 - 54 U/L 34 - -    PATHOLOGY:   RADIOGRAPHIC STUDIES: I have personally reviewed the radiological images as listed and agreed with the findings in the report.  CT Abdomen Pelvis 12/26/2015 IMPRESSION: There is focal wall thickening in the distal stomach region. Question localized distal gastritis. No other bowel wall thickening evident. No bowel obstruction. No diverticulitis. No free air or portal venous air. Scattered sigmoid diverticula are noted without inflammation.  No abscess.  Appendix appears normal.  Gallbladder and uterus absent.  Degenerative changes noted in the lumbar spine with spondylolisthesis L4-5. Focal spinal stenosis along the inferior aspect of L4-5 is noted, due to combination of disc protrusion, bony hypertrophy, and spondylolisthesis.  Oral contrast is seen in the distal esophagus, consistent with a degree of spontaneous gastroesophageal reflux.  There is mild aortoiliac atherosclerosis.  MRI Brain w/o Contrast 12/25/2015 IMPRESSION: 1. Unremarkable appearance of the brain and internal  auditory canals. No evidence of vestibular schwannoma on this unenhanced study. 2. Trace bilateral mastoid fluid.  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 BMBX has approximately 9% plasma cells kappa restricted, given the definition of myeloma as detailed below [place] does not meet the criteria. However, I feel that she needs close observation. We discussed return in 2 to 3 months with ongoing labs. She agrees. She will continue her Eliquis. She will continue to follow with nephrology.  She has not had a renal biopsy, this may be the next step and we can discuss with her nephrologist, Dr. Burnett Sheng.      // Labs reviewed. SPEP pending. Continue observation of her labs. There is a 1% chance per year of her MGUS progressing to multiple myeloma.  She will call Dr. Aline Brochure about her pain medication.    She will return for follow up in 4 months with labs. CBC, CMP, LDH, Beta 2 microglobulin, CRMP light chains, and SPEP at next visit.    No orders of the defined types were placed in this encounter.   All of the patients questions were answered with apparent satisfaction. The patient knows to call the clinic with any problems, questions or concerns.  This document serves as a record of services personally performed by  Twana First, MD. It was created on her behalf by Martinique Casey, a trained medical scribe. The creation of this record is based on the scribe's personal observations and the provider's statements to them. This document has been checked and approved by the attending provider.  I have reviewed the above documentation for accuracy and completeness, and I agree with the above.  03/30/2016 12:03 PM

## 2016-03-30 NOTE — Patient Instructions (Signed)
[place] at [facility] Discharge Instructions  RECOMMENDATIONS MADE BY THE CONSULTANT AND ANY TEST RESULTS WILL BE SENT TO YOUR REFERRING PHYSICIAN.  You were seen today by Dr. Barron Schmid Follow up in 4 months with labwork See Amy up front for appointments   Thank you for choosing [place] at [facility] to provide your oncology and hematology care.  To afford each patient quality time with our provider, please arrive at least 15 minutes before your scheduled appointment time.    If you have a lab appointment with the [facility] please come in thru the  Main Entrance and check in at the main information desk  You need to re-schedule your appointment should you arrive 10 or more minutes late.  We strive to give you quality time with our providers, and arriving late affects you and other patients whose appointments are after yours.  Also, if you no show three or more times for appointments you may be dismissed from the clinic at the providers discretion.     Again, thank you for choosing [facility].  Our hope is that these requests will decrease the amount of time that you wait before being seen by our physicians.       _____________________________________________________________  Should you have questions after your visit to [facility], please contact our office at [phone] between the hours of 8:30 a.m. and 4:30 p.m.  Voicemails left after 4:30 p.m. will not be returned until the following business day.  For prescription refill requests, have your pharmacy contact our office.       Resources For Cancer Patients and their Caregivers ? American Cancer Society: Can assist with transportation, wigs, general needs, runs Look Good Feel Better.        (239) 677-0196 ? Cancer Care: Provides financial assistance, online support groups, medication/co-pay assistance.  1-800-813-HOPE (703) 137-8372) ? [facility] Assists [place] cancer patients and their families through emotional , educational and financial support.  (217)595-5725 ? [place] DSS Where to apply for food stamps, Medicaid and utility assistance. 713-388-4807 ? [facility]: Transportation to medical appointments. (820)459-8053 ? Social Security Administration: May apply for disability if have a Stage IV cancer. (410) 133-6472 737-226-5622 ? [employer], Disability and Transit Services: Assists with nutrition, care and transit needs. [place] Support Programs: @10RELATIVEDAYS @ > Cancer Support Group  2nd Tuesday of the month 1pm-2pm, Journey Room  > Creative Journey  3rd Tuesday of the month 1130am-1pm, Journey Room  > Look Good Feel Better  1st Wednesday of the month 10am-12 noon, Journey Room (Call [facility] to register [phone])

## 2016-03-31 LAB — IGG, IGA, IGM
IgA: 212 mg/dL (ref 87–352)
IgG (Immunoglobin G), Serum: 2112 mg/dL — ABNORMAL HIGH (ref 700–1600)
IgM, Serum: 27 mg/dL (ref 26–217)

## 2016-03-31 LAB — HEMOGLOBIN A1C
Hgb A1c MFr Bld: 7.9 % — ABNORMAL HIGH (ref 4.8–5.6)
Mean Plasma Glucose: 180 mg/dL

## 2016-03-31 LAB — KAPPA/LAMBDA LIGHT CHAINS
KAPPA, LAMDA LIGHT CHAIN RATIO: 15.17 — AB (ref 0.26–1.65)
Kappa free light chain: 306.5 mg/L — ABNORMAL HIGH (ref 3.3–19.4)
LAMDA FREE LIGHT CHAINS: 20.2 mg/L (ref 5.7–26.3)

## 2016-04-01 LAB — IMMUNOFIXATION ELECTROPHORESIS
IGA: 216 mg/dL (ref 87–352)
IgG (Immunoglobin G), Serum: 2266 mg/dL — ABNORMAL HIGH (ref 700–1600)
IgM, Serum: 29 mg/dL (ref 26–217)
Total Protein ELP: 7.8 g/dL (ref 6.0–8.5)

## 2016-04-01 LAB — PROTEIN ELECTROPHORESIS, SERUM
A/G Ratio: 0.7 (ref 0.7–1.7)
ALPHA-2-GLOBULIN: 1.1 g/dL — AB (ref 0.4–1.0)
Albumin ELP: 3.3 g/dL (ref 2.9–4.4)
Alpha-1-Globulin: 0.3 g/dL (ref 0.0–0.4)
BETA GLOBULIN: 1.4 g/dL — AB (ref 0.7–1.3)
GLOBULIN, TOTAL: 4.8 g/dL — AB (ref 2.2–3.9)
Gamma Globulin: 2.1 g/dL — ABNORMAL HIGH (ref 0.4–1.8)
M-SPIKE, %: 1.4 g/dL — AB
Total Protein ELP: 8.1 g/dL (ref 6.0–8.5)

## 2016-04-07 ENCOUNTER — Ambulatory Visit: Payer: Medicare Other | Admitting: Gastroenterology

## 2016-04-20 ENCOUNTER — Encounter: Payer: Self-pay | Admitting: Gastroenterology

## 2016-04-20 ENCOUNTER — Ambulatory Visit (INDEPENDENT_AMBULATORY_CARE_PROVIDER_SITE_OTHER): Payer: Medicare Other | Admitting: Gastroenterology

## 2016-04-20 VITALS — BP 115/79 | HR 92 | Temp 98.5°F | Ht 67.5 in | Wt 227.6 lb

## 2016-04-20 DIAGNOSIS — R748 Abnormal levels of other serum enzymes: Secondary | ICD-10-CM

## 2016-04-20 NOTE — Patient Instructions (Addendum)
We will see what your liver numbers look like in June. I will see you back in June as well.  You may take Zantac each evening as needed for reflux. Continue with Protonix each morning, 30 minutes before breakfast.   I am glad things have calmed down for a bit!    Food Choices for Gastroesophageal Reflux Disease, Adult When you have gastroesophageal reflux disease (GERD), the foods you eat and your eating habits are very important. Choosing the right foods can help ease the discomfort of GERD. Consider working with a diet and nutrition specialist (dietitian) to help you make healthy food choices. What general guidelines should I follow? Eating plan   Choose healthy foods low in fat, such as fruits, vegetables, whole grains, low-fat dairy products, and lean meat, fish, and poultry.  Eat frequent, small meals instead of three large meals each day. Eat your meals slowly, in a relaxed setting. Avoid bending over or lying down until 2-3 hours after eating.  Limit high-fat foods such as fatty meats or fried foods.  Limit your intake of oils, butter, and shortening to less than 8 teaspoons each day.  Avoid the following:  Foods that cause symptoms. These may be different for different people. Keep a food diary to keep track of foods that cause symptoms.  Alcohol.  Drinking large amounts of liquid with meals.  Eating meals during the 2-3 hours before bed.  Cook foods using methods other than frying. This may include baking, grilling, or broiling. Lifestyle    Maintain a healthy weight. Ask your health care provider what weight is healthy for you. If you need to lose weight, work with your health care provider to do so safely.  Exercise for at least 30 minutes on 5 or more days each week, or as told by your health care provider.  Avoid wearing clothes that fit tightly around your waist and chest.  Do not use any products that contain nicotine or tobacco, such as cigarettes and  e-cigarettes. If you need help quitting, ask your health care provider.  Sleep with the head of your bed raised. Use a wedge under the mattress or blocks under the bed frame to raise the head of the bed. What foods are not recommended? The items listed may not be a complete list. Talk with your dietitian about what dietary choices are best for you. Grains  Pastries or quick breads with added fat. [place] toast. Vegetables  Deep fried vegetables. [place] fries. Any vegetables prepared with added fat. Any vegetables that cause symptoms. For some people this may include tomatoes and tomato products, chili peppers, onions and garlic, and horseradish. Fruits  Any fruits prepared with added fat. Any fruits that cause symptoms. For some people this may include citrus fruits, such as oranges, grapefruit, pineapple, and lemons. Meats and other protein foods  High-fat meats, such as fatty beef or pork, hot dogs, ribs, ham, sausage, salami and bacon. Fried meat or protein, including fried fish and fried chicken. Nuts and nut butters. Dairy  Whole milk and chocolate milk. Sour cream. Cream. Ice cream. Cream cheese. Milk shakes. Beverages  Coffee and tea, with or without caffeine. Carbonated beverages. Sodas. Energy drinks. Fruit juice made with acidic fruits (such as orange or grapefruit). Tomato juice. Alcoholic drinks. Fats and oils  Butter. Margarine. Shortening. Ghee. Sweets and desserts  Chocolate and cocoa. Donuts. Seasoning and other foods  Pepper. Peppermint and spearmint. Any condiments, herbs, or seasonings that cause symptoms. For some people, this  may include curry, hot sauce, or vinegar-based salad dressings. Summary  When you have gastroesophageal reflux disease (GERD), food and lifestyle choices are very important to help ease the discomfort of GERD.  Eat frequent, small meals instead of three large meals each day. Eat your meals slowly, in a relaxed setting. Avoid bending over or lying  down until 2-3 hours after eating.  Limit high-fat foods such as fatty meat or fried foods. This information is not intended to replace advice given to you by your health care provider. Make sure you discuss any questions you have with your health care provider. Document Released: 02/02/2005 Document Revised: 02/04/2016 Document Reviewed: 02/04/2016 Elsevier Interactive Patient Education  2017 [employer].

## 2016-04-20 NOTE — Progress Notes (Signed)
Referring Provider: Jani Gravel, MD Primary Care Physician:  Jani Gravel, MD Primary GI: Dr. Gala Romney   Chief Complaint  Patient presents with  . Anemia    HPI:   Stephanie [name] is a 66 y.o. female presenting today with a history of isolated elevation of alk phos, normal transaminases. Recent EGD/TCS on file due to heme positive stool. Diverticulosis on colonoscopy and EGD normal with empiric dilation.   Thorough work-up of elevated alk phos in setting of fatty liver with AMA, ANA negative. ASMA moderately positive. Negative Hep B, C, A. Followed by oncology with history of nephrotic syndrome, IgG kappa monoclonal gammopathy, observed for now.   Underwent liver biopsy in interim from last visit. She had mild chronic hepatitis, mild fatty liver, NO significant fibrosis, non-specific findings. Likely secondary to drug-induced etiology.  Likely mildly elevated ASMA is non-specific.  Alk Phos has improved since November of last year. Transaminases normal. Stopped taking diclofenac.Taking extra strength tylenol.  No energy since the blood clots. No abdominal pain. No constipation or diarrhea. Not taking the Linzess every day. Taking Protonix 40 mg daily. Had severe reflux the other night. Dysphagia much improved since empiric dilation.   Past Medical History:  Diagnosis Date  . Anemia   . Arthritis   . Asthma   . COPD (chronic obstructive pulmonary disease) ([place])   . Deep vein thrombosis (DVT) of both lower extremities ([place]) 06/27/2015  . Diabetes mellitus   . Fibromyalgia   . GERD (gastroesophageal reflux disease)   . H/O hiatal hernia   . Hypercholesteremia   . Hypertension   . Hyperthyroidism   . IBS (irritable bowel syndrome)   . Inner ear disease   . 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 PONV (postoperative nausea and vomiting)   . Tachycardia     Past Surgical History:  Procedure Laterality Date  . ABDOMINAL HYSTERECTOMY  partial  . CARPAL  TUNNEL RELEASE Right 1991  . CATARACT EXTRACTION W/PHACO Right 05/08/2013   Procedure: CATARACT EXTRACTION PHACO AND INTRAOCULAR LENS PLACEMENT (IOC);  Surgeon: Tonny Branch, MD;  Location: [facility];  Service: Ophthalmology;  Laterality: Right;  CDE 10.31  . CATARACT EXTRACTION W/PHACO Left 08/17/2013   Procedure: CATARACT EXTRACTION PHACO AND INTRAOCULAR LENS PLACEMENT (IOC);  Surgeon: Tonny Branch, MD;  Location: [facility];  Service: Ophthalmology;  Laterality: Left;  CDE:9.03  . CHOLECYSTECTOMY    . COLONOSCOPY WITH PROPOFOL N/A 01/06/2016   Dr. Gala Romney: diverticulosis   . DENTAL SURGERY    . ESOPHAGOGASTRODUODENOSCOPY (EGD) WITH PROPOFOL N/A 01/06/2016   Dr. Gala Romney: normal s/p empiric dilation   . MALONEY DILATION N/A 01/06/2016   Procedure: Venia Minks DILATION;  Surgeon: Daneil Dolin, MD;  Location: [facility];  Service: Endoscopy;  Laterality: N/A;  . WRIST GANGLION EXCISION Left     Current Outpatient Prescriptions  Medication Sig Dispense Refill  . albuterol (PROVENTIL HFA;VENTOLIN HFA) 108 (90 BASE) MCG/ACT inhaler Inhale 2 puffs into the lungs every 6 (six) hours as needed for wheezing or shortness of breath.    Marland Kitchen albuterol (PROVENTIL) (2.5 MG/3ML) 0.083% nebulizer solution Take 2.5 mg by nebulization every 6 (six) hours as needed for wheezing or shortness of breath.    Marland Kitchen apixaban (ELIQUIS) 5 MG TABS tablet Take 10 mg by mouth BID through Friday 5/19 evening. Take 38m by mouth BID starting 5/20 AM. (Patient taking differently: Take 5 mg by mouth 2 (two) times daily. ) 60 tablet 3  .  Bilberry, Vaccinium myrtillus, 100 MG CAPS Take 1 capsule by mouth 2 (two) times daily.    . cetirizine (ZYRTEC) 10 MG tablet Take 10 mg by mouth daily.    . Cholecalciferol (VITAMIN D3) 5000 units TABS Take 5,000 Units by mouth daily.     Marland Kitchen CINNAMON PO Take 2-4 capsules by mouth 2 (two) times daily as needed (high blood sugar).     . clobetasol ointment (TEMOVATE) 6.16 % Apply 1 application topically 2 (two) times  daily as needed (rash).     Marland Kitchen Cod Liver Oil CAPS Take 1 capsule by mouth daily.    . Cyanocobalamin (VITAMIN B-12 PO) Take 1 tablet by mouth daily.      . DULoxetine (CYMBALTA) 30 MG capsule Take 30 mg by mouth 2 (two) times daily.     Marland Kitchen HUMALOG KWIKPEN 100 UNIT/ML KiwkPen Inject 30 Units into the skin 3 (three) times daily.     . hydrOXYzine (ATARAX/VISTARIL) 25 MG tablet Take 25 mg by mouth every 8 (eight) hours as needed for itching.     . Insulin Glargine (LANTUS SOLOSTAR) 100 UNIT/ML Solostar Pen Inject 75 Units into the skin 2 (two) times daily.    Marland Kitchen levothyroxine (SYNTHROID, LEVOTHROID) 100 MCG tablet Take 100 mcg by mouth daily before breakfast.   0  . linaclotide (LINZESS) 145 MCG CAPS capsule Take 1 capsule (145 mcg total) by mouth daily before breakfast. 30 capsule 11  . linagliptin (TRADJENTA) 5 MG TABS tablet Take 5 mg by mouth daily.    Marland Kitchen LYRICA 75 MG capsule Take 75 mg by mouth 2 (two) times daily.     . Melatonin 5 MG CAPS Take 5-10 mg by mouth at bedtime.     . metolazone (ZAROXOLYN) 5 MG tablet Take 1 tablet (5 mg total) by mouth 2 (two) times daily. 60 tablet 0  . metoprolol (LOPRESSOR) 100 MG tablet TAKE 1 TABLET BY MOUTH TWICE DAILY **TAKE WITH 25MG TABLET FOR TOTAL DOSAGE OF 125MG** 180 tablet 1  . metoprolol tartrate (LOPRESSOR) 25 MG tablet Take 25 mg by mouth 2 (two) times daily.    . mometasone (ELOCON) 0.1 % cream Apply 1 application topically daily as needed (rash).     . mupirocin ointment (BACTROBAN) 2 % Apply 1 application topically 2 (two) times daily as needed (irritation).     . pantoprazole (PROTONIX) 40 MG tablet Take 40 mg by mouth daily.     . pravastatin (PRAVACHOL) 40 MG tablet Take 60 mg by mouth every evening.     . prednisoLONE acetate (PRED FORTE) 1 % ophthalmic suspension 4 drops 2 (two) times daily as needed. Use for left ear pain    . PULMICORT FLEXHALER 180 MCG/ACT inhaler Inhale 2 puffs into the lungs daily as needed (for shortness of  breath-wheezing).     . torsemide (DEMADEX) 20 MG tablet Take 2 tablets (40 mg total) by mouth daily. (Patient taking differently: Take 20 mg by mouth daily. May take an additional  72ms as needed for swelling) 60 tablet 0  . traMADol (ULTRAM) 50 MG tablet Take 1 tablet (50 mg total) by mouth every 6 (six) hours as needed. (Patient taking differently: Take 50 mg by mouth 2 (two) times daily. ) 16 tablet 0  . vitamin C (ASCORBIC ACID) 500 MG tablet Take 1,000 mg by mouth daily.    . vitamin E 400 UNIT capsule Take 400 Units by mouth daily.     No current facility-administered medications for this  visit.     Allergies as of 04/20/2016 - Review Complete 04/20/2016  Allergen Reaction Noted  . Tetracyclines & related Anaphylaxis 09/19/2010  . Banana Hives and Nausea And Vomiting 05/04/2013  . Penicillins Rash 09/19/2010    Family History  Problem Relation Age of Onset  . Colon cancer Neg Hx     Social History   Social History  . Marital status: Married    Spouse name: N/A  . Number of children: N/A  . Years of education: N/A   Social History Main Topics  . Smoking status: Former Smoker    Packs/day: 0.25    Years: 30.00    Types: Cigarettes    Quit date: 02/17/2014  . Smokeless tobacco: Never Used     Comment: some day smoker  . Alcohol use No  . Drug use: No  . Sexual activity: No   Other Topics Concern  . None   Social History Narrative  . None    Review of Systems: As mentioned in HPI   Physical Exam: BP 115/79   Pulse 92   Temp 98.5 F (36.9 C) (Oral)   Ht 5' 7.5" (1.715 m)   Wt 227 lb 9.6 oz (103.2 kg)   BMI 35.12 kg/m  General:   Alert and oriented. No distress noted. Pleasant and cooperative.  Head:  Normocephalic and atraumatic. Eyes:  Conjuctiva clear without scleral icterus. Mouth:  Oral mucosa pink and moist. Good dentition. No lesions. Abdomen:  +BS, soft, non-tender and non-distended. No rebound or guarding. No HSM or masses noted. Msk:   Symmetrical without gross deformities. Normal posture. Extremities:  Without edema. Neurologic:  Alert and  oriented x4;  grossly normal neurologically. Psych:  Alert and cooperative. Normal mood and affect.  Lab Results  Component Value Date   ALT 34 03/30/2016   AST 29 03/30/2016   GGT 141 (H) 10/14/2015   ALKPHOS 193 (H) 03/30/2016   BILITOT 0.4 03/30/2016

## 2016-04-26 ENCOUNTER — Encounter: Payer: Self-pay | Admitting: Gastroenterology

## 2016-04-26 NOTE — Assessment & Plan Note (Signed)
Improved from 300 range, now 193. Liver biopsy completed with mild chronic hepatitis, mild fatty liver, no significant fibrosis, non-specific findings and query drug-induced. She has historically taken a lot of diclofenac but stopped recently. Negative AMA, ANA, moderately positive ASMA. Will recheck HFP in June. Return in June for follow-up. Otherwise, constipation doing well with Linzess prn and taking Protonix 40 mg daily. Return in June.

## 2016-04-28 NOTE — Progress Notes (Signed)
cc'ed to pcp °

## 2016-04-30 ENCOUNTER — Other Ambulatory Visit: Payer: Self-pay | Admitting: Adult Health

## 2016-05-05 DIAGNOSIS — L281 Prurigo nodularis: Secondary | ICD-10-CM | POA: Diagnosis not present

## 2016-05-05 DIAGNOSIS — L309 Dermatitis, unspecified: Secondary | ICD-10-CM | POA: Diagnosis not present

## 2016-05-07 DIAGNOSIS — D649 Anemia, unspecified: Secondary | ICD-10-CM | POA: Diagnosis not present

## 2016-05-07 DIAGNOSIS — N183 Chronic kidney disease, stage 3 (moderate): Secondary | ICD-10-CM | POA: Diagnosis not present

## 2016-05-07 DIAGNOSIS — R809 Proteinuria, unspecified: Secondary | ICD-10-CM | POA: Diagnosis not present

## 2016-05-07 DIAGNOSIS — E559 Vitamin D deficiency, unspecified: Secondary | ICD-10-CM | POA: Diagnosis not present

## 2016-05-12 DIAGNOSIS — L280 Lichen simplex chronicus: Secondary | ICD-10-CM | POA: Diagnosis not present

## 2016-05-12 DIAGNOSIS — L239 Allergic contact dermatitis, unspecified cause: Secondary | ICD-10-CM | POA: Diagnosis not present

## 2016-05-20 ENCOUNTER — Other Ambulatory Visit (HOSPITAL_COMMUNITY)
Admission: RE | Admit: 2016-05-20 | Discharge: 2016-05-20 | Disposition: A | Discharge status: Home / Self Care | Payer: Medicare Other | Source: Ambulatory Visit | Attending: Nephrology | Admitting: Nephrology

## 2016-05-20 DIAGNOSIS — Z79899 Other long term (current) drug therapy: Secondary | ICD-10-CM | POA: Insufficient documentation

## 2016-05-20 DIAGNOSIS — N179 Acute kidney failure, unspecified: Secondary | ICD-10-CM | POA: Insufficient documentation

## 2016-05-20 LAB — RENAL FUNCTION PANEL
ALBUMIN: 3.4 g/dL — AB (ref 3.5–5.0)
Anion gap: 12 (ref 5–15)
BUN: 30 mg/dL — ABNORMAL HIGH (ref 6–20)
CALCIUM: 9.4 mg/dL (ref 8.9–10.3)
CO2: 27 mmol/L (ref 22–32)
CREATININE: 1.59 mg/dL — AB (ref 0.44–1.00)
Chloride: 98 mmol/L — ABNORMAL LOW (ref 101–111)
GFR, EST AFRICAN AMERICAN: 38 mL/min — AB (ref >60)
GFR, EST NON AFRICAN AMERICAN: 33 mL/min — AB (ref >60)
Glucose, Bld: 118 mg/dL — ABNORMAL HIGH (ref 65–99)
PHOSPHORUS: 4 mg/dL (ref 2.5–4.6)
Potassium: 3.6 mmol/L (ref 3.5–5.1)
SODIUM: 137 mmol/L (ref 135–145)

## 2016-05-22 DIAGNOSIS — K7689 Other specified diseases of liver: Secondary | ICD-10-CM | POA: Diagnosis not present

## 2016-05-22 DIAGNOSIS — M545 Low back pain: Secondary | ICD-10-CM | POA: Diagnosis not present

## 2016-05-22 DIAGNOSIS — E118 Type 2 diabetes mellitus with unspecified complications: Secondary | ICD-10-CM | POA: Diagnosis not present

## 2016-05-27 ENCOUNTER — Other Ambulatory Visit: Payer: Self-pay | Admitting: Adult Health

## 2016-05-28 ENCOUNTER — Other Ambulatory Visit: Payer: Self-pay | Admitting: Adult Health

## 2016-06-05 ENCOUNTER — Other Ambulatory Visit (HOSPITAL_COMMUNITY): Payer: Self-pay | Admitting: Internal Medicine

## 2016-06-05 DIAGNOSIS — M545 Low back pain: Secondary | ICD-10-CM | POA: Diagnosis not present

## 2016-06-05 DIAGNOSIS — M5441 Lumbago with sciatica, right side: Secondary | ICD-10-CM | POA: Diagnosis not present

## 2016-06-05 DIAGNOSIS — I10 Essential (primary) hypertension: Secondary | ICD-10-CM | POA: Diagnosis not present

## 2016-06-05 DIAGNOSIS — E1121 Type 2 diabetes mellitus with diabetic nephropathy: Secondary | ICD-10-CM | POA: Diagnosis not present

## 2016-06-05 DIAGNOSIS — Z79899 Other long term (current) drug therapy: Secondary | ICD-10-CM | POA: Diagnosis not present

## 2016-06-15 ENCOUNTER — Ambulatory Visit (HOSPITAL_COMMUNITY)
Admission: RE | Admit: 2016-06-15 | Discharge: 2016-06-15 | Disposition: A | Discharge status: Home / Self Care | Payer: Medicare Other | Source: Ambulatory Visit | Attending: Internal Medicine | Admitting: Internal Medicine

## 2016-06-15 DIAGNOSIS — M5441 Lumbago with sciatica, right side: Secondary | ICD-10-CM | POA: Diagnosis not present

## 2016-06-15 DIAGNOSIS — M48061 Spinal stenosis, lumbar region without neurogenic claudication: Secondary | ICD-10-CM | POA: Diagnosis not present

## 2016-06-15 DIAGNOSIS — M47816 Spondylosis without myelopathy or radiculopathy, lumbar region: Secondary | ICD-10-CM | POA: Diagnosis not present

## 2016-07-03 DIAGNOSIS — E1121 Type 2 diabetes mellitus with diabetic nephropathy: Secondary | ICD-10-CM | POA: Diagnosis not present

## 2016-07-03 DIAGNOSIS — M545 Low back pain: Secondary | ICD-10-CM | POA: Diagnosis not present

## 2016-07-03 DIAGNOSIS — I10 Essential (primary) hypertension: Secondary | ICD-10-CM | POA: Diagnosis not present

## 2016-07-06 ENCOUNTER — Other Ambulatory Visit: Payer: Self-pay

## 2016-07-06 DIAGNOSIS — R748 Abnormal levels of other serum enzymes: Secondary | ICD-10-CM

## 2016-07-07 DIAGNOSIS — L281 Prurigo nodularis: Secondary | ICD-10-CM | POA: Diagnosis not present

## 2016-07-27 ENCOUNTER — Other Ambulatory Visit (HOSPITAL_COMMUNITY): Payer: Medicare Other

## 2016-07-27 ENCOUNTER — Encounter (HOSPITAL_COMMUNITY): Payer: Medicare Other | Attending: Oncology

## 2016-07-27 ENCOUNTER — Encounter (HOSPITAL_BASED_OUTPATIENT_CLINIC_OR_DEPARTMENT_OTHER): Payer: Medicare Other | Admitting: Oncology

## 2016-07-27 ENCOUNTER — Ambulatory Visit: Payer: Medicare Other | Admitting: Gastroenterology

## 2016-07-27 ENCOUNTER — Encounter (HOSPITAL_COMMUNITY): Payer: Self-pay | Admitting: Oncology

## 2016-07-27 ENCOUNTER — Ambulatory Visit (HOSPITAL_COMMUNITY): Payer: Medicare Other

## 2016-07-27 VITALS — BP 117/53 | HR 79 | Temp 98.6°F | Resp 18 | Ht 67.5 in | Wt 235.0 lb

## 2016-07-27 DIAGNOSIS — Z86718 Personal history of other venous thrombosis and embolism: Secondary | ICD-10-CM | POA: Diagnosis not present

## 2016-07-27 DIAGNOSIS — D472 Monoclonal gammopathy: Secondary | ICD-10-CM

## 2016-07-27 DIAGNOSIS — K589 Irritable bowel syndrome without diarrhea: Secondary | ICD-10-CM | POA: Diagnosis not present

## 2016-07-27 DIAGNOSIS — Z87891 Personal history of nicotine dependence: Secondary | ICD-10-CM | POA: Insufficient documentation

## 2016-07-27 DIAGNOSIS — E119 Type 2 diabetes mellitus without complications: Secondary | ICD-10-CM | POA: Insufficient documentation

## 2016-07-27 DIAGNOSIS — M797 Fibromyalgia: Secondary | ICD-10-CM | POA: Diagnosis not present

## 2016-07-27 DIAGNOSIS — E7800 Pure hypercholesterolemia, unspecified: Secondary | ICD-10-CM | POA: Insufficient documentation

## 2016-07-27 DIAGNOSIS — E039 Hypothyroidism, unspecified: Secondary | ICD-10-CM | POA: Diagnosis not present

## 2016-07-27 DIAGNOSIS — K219 Gastro-esophageal reflux disease without esophagitis: Secondary | ICD-10-CM | POA: Diagnosis not present

## 2016-07-27 DIAGNOSIS — J449 Chronic obstructive pulmonary disease, unspecified: Secondary | ICD-10-CM | POA: Diagnosis not present

## 2016-07-27 DIAGNOSIS — D649 Anemia, unspecified: Secondary | ICD-10-CM

## 2016-07-27 DIAGNOSIS — Z880 Allergy status to penicillin: Secondary | ICD-10-CM | POA: Insufficient documentation

## 2016-07-27 DIAGNOSIS — E0590 Thyrotoxicosis, unspecified without thyrotoxic crisis or storm: Secondary | ICD-10-CM | POA: Diagnosis not present

## 2016-07-27 DIAGNOSIS — I10 Essential (primary) hypertension: Secondary | ICD-10-CM | POA: Insufficient documentation

## 2016-07-27 LAB — CBC WITH DIFFERENTIAL/PLATELET
Basophils Absolute: 0 10*3/uL (ref 0.0–0.1)
Basophils Relative: 0 %
EOS ABS: 0.4 10*3/uL (ref 0.0–0.7)
EOS PCT: 3 %
HCT: 33.9 % — ABNORMAL LOW (ref 36.0–46.0)
Hemoglobin: 11.2 g/dL — ABNORMAL LOW (ref 12.0–15.0)
LYMPHS ABS: 4.1 10*3/uL — AB (ref 0.7–4.0)
LYMPHS PCT: 33 %
MCH: 27.9 pg (ref 26.0–34.0)
MCHC: 33 g/dL (ref 30.0–36.0)
MCV: 84.3 fL (ref 78.0–100.0)
MONO ABS: 0.8 10*3/uL (ref 0.1–1.0)
MONOS PCT: 7 %
NEUTROS ABS: 6.9 10*3/uL (ref 1.7–7.7)
NEUTROS PCT: 57 %
PLATELETS: 253 10*3/uL (ref 150–400)
RBC: 4.02 MIL/uL (ref 3.87–5.11)
RDW: 14.1 % (ref 11.5–15.5)
WBC: 12.3 10*3/uL — AB (ref 4.0–10.5)

## 2016-07-27 LAB — COMPREHENSIVE METABOLIC PANEL
ALT: 35 U/L (ref 14–54)
ANION GAP: 7 (ref 5–15)
AST: 33 U/L (ref 15–41)
Albumin: 3.1 g/dL — ABNORMAL LOW (ref 3.5–5.0)
Alkaline Phosphatase: 189 U/L — ABNORMAL HIGH (ref 38–126)
BUN: 23 mg/dL — ABNORMAL HIGH (ref 6–20)
CHLORIDE: 101 mmol/L (ref 101–111)
CO2: 28 mmol/L (ref 22–32)
CREATININE: 1.23 mg/dL — AB (ref 0.44–1.00)
Calcium: 8.6 mg/dL — ABNORMAL LOW (ref 8.9–10.3)
GFR, EST AFRICAN AMERICAN: 52 mL/min — AB (ref >60)
GFR, EST NON AFRICAN AMERICAN: 45 mL/min — AB (ref >60)
Glucose, Bld: 103 mg/dL — ABNORMAL HIGH (ref 65–99)
POTASSIUM: 3.9 mmol/L (ref 3.5–5.1)
SODIUM: 136 mmol/L (ref 135–145)
Total Bilirubin: 0.5 mg/dL (ref 0.3–1.2)
Total Protein: 7.9 g/dL (ref 6.5–8.1)

## 2016-07-27 LAB — LACTATE DEHYDROGENASE: LDH: 157 U/L (ref 98–192)

## 2016-07-27 NOTE — Progress Notes (Signed)
HEMATOLOGY/ONCOLOGY PROGRESS NOTE  Date of Service: 07/27/2016  Patient Care Team: Jani Gravel, MD as PCP - General (Internal Medicine) Herminio Commons, MD as Attending Physician (Cardiology) Gala Romney Cristopher Estimable, MD as Consulting Physician (Gastroenterology) Dr Lowanda Foster MD (nephrology) PCP: Kevan Ny MD  CHIEF COMPLAINTS/PURPOSE OF CONSULTATION:  M spike on peripheral blood and nephrotic range proteinuria Anemia Bilateral DVT  HISTORY OF PRESENTING ILLNESS:  Stephanie [name] is a wonderful 66 y.o. female who returns for follow up of M spike in peripheral blood associated with nephrotic range proteinuria to rule out a plasma cell dyscrasia.  Patient has a history of hypertension, diabetes, fibromyalgia, hypothyroidism (on thyroid replacement), COPD ex-smoker quit in December 2016, irritable bowel syndrome with had issues with increasing left lower extremity swelling for about a year on and off.  Patient with multiple medical problems.  Continues to low back pain.  Patient is on tramadol and muscle relaxantl  helping with the pain. Since last evaluation patient did not have any significant changes e had an MRI out any lytic areas.    MEDICAL HISTORY:  Past Medical History:  Diagnosis Date  . Anemia   . Arthritis   . Asthma   . COPD (chronic obstructive pulmonary disease) ([place])   . Deep vein thrombosis (DVT) of both lower extremities ([place]) 06/27/2015  . Diabetes mellitus   . Fibromyalgia   . GERD (gastroesophageal reflux disease)   . H/O hiatal hernia   . Hypercholesteremia   . Hypertension   . Hyperthyroidism   . IBS (irritable bowel syndrome)   . Inner ear disease   . 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 PONV (postoperative nausea and vomiting)   . Tachycardia     SURGICAL HISTORY: Past Surgical History:  Procedure Laterality Date  . ABDOMINAL HYSTERECTOMY  partial  . CARPAL TUNNEL RELEASE Right 1991  . CATARACT EXTRACTION W/PHACO Right  05/08/2013   Procedure: CATARACT EXTRACTION PHACO AND INTRAOCULAR LENS PLACEMENT (IOC);  Surgeon: Tonny Branch, MD;  Location: [facility];  Service: Ophthalmology;  Laterality: Right;  CDE 10.31  . CATARACT EXTRACTION W/PHACO Left 08/17/2013   Procedure: CATARACT EXTRACTION PHACO AND INTRAOCULAR LENS PLACEMENT (IOC);  Surgeon: Tonny Branch, MD;  Location: [facility];  Service: Ophthalmology;  Laterality: Left;  CDE:9.03  . CHOLECYSTECTOMY    . COLONOSCOPY WITH PROPOFOL N/A 01/06/2016   Dr. Gala Romney: diverticulosis   . DENTAL SURGERY    . ESOPHAGOGASTRODUODENOSCOPY (EGD) WITH PROPOFOL N/A 01/06/2016   Dr. Gala Romney: normal s/p empiric dilation   . MALONEY DILATION N/A 01/06/2016   Procedure: Venia Minks DILATION;  Surgeon: Daneil Dolin, MD;  Location: [facility];  Service: Endoscopy;  Laterality: N/A;  . WRIST GANGLION EXCISION Left     SOCIAL HISTORY: Social History   Social History  . Marital status: Married    Spouse name: N/A  . Number of children: N/A  . Years of education: N/A   Occupational History  . Not on file.   Social History Main Topics  . Smoking status: Former Smoker    Packs/day: 0.25    Years: 30.00    Types: Cigarettes    Quit date: 02/17/2014  . Smokeless tobacco: Never Used     Comment: some day smoker  . Alcohol use No  . Drug use: No  . Sexual activity: No   Other Topics Concern  . Not on file   Social History Narrative  . No narrative on file    FAMILY HISTORY: Family  History  Problem Relation Age of Onset  . Colon cancer Neg Hx     ALLERGIES:  is allergic to tetracyclines & related; banana; and penicillins.  MEDICATIONS:    REVIEW OF SYSTEMS: Review of Systems  Constitutional: Negative.   HENT: Negative.   Eyes: Negative.   Respiratory: Positive for shortness of breath.   Cardiovascular: Negative.   Gastrointestinal: Positive for abdominal pain (when palpated).  Genitourinary: Negative.   Musculoskeletal: Positive for joint pain (arthritis).  Skin:  Negative.   Neurological: Negative.   Endo/Heme/Allergies: Negative.   Psychiatric/Behavioral: Negative.   All other systems reviewed and are negative. 14 point review of systems was performed and is negative except as detailed under history of present illness and above  PHYSICAL EXAMINATION: ECOG PERFORMANCE STATUS: 2 - Symptomatic, <50% confined to bed  There were no vitals taken for this visit.  Physical Exam  Constitutional: She is oriented to person, place, and time and well-developed, well-nourished, and in no distress.  HENT:  Head: Normocephalic and atraumatic.  Eyes: EOM are normal. Pupils are equal, round, and reactive to light.  Neck: Normal range of motion. Neck supple.  Cardiovascular: Normal rate, regular rhythm and normal heart sounds.   Pulmonary/Chest: Effort normal and breath sounds normal.  Abdominal: Soft. Bowel sounds are normal. She exhibits no distension and no mass. There is no tenderness. There is no rebound and no guarding.  Musculoskeletal: Normal range of motion.  Neurological: She is alert and oriented to person, place, and time. Gait normal.  Skin: Skin is warm and dry.  Nursing note and vitals reviewed.  LABORATORY DATA:  I have reviewed the data as listed  . CBC Latest Ref Rng & Units 07/27/2016 03/30/2016 03/09/2016  WBC 4.0 - 10.5 K/uL 12.3(H) 12.8(H) -  Hemoglobin 12.0 - 15.0 g/dL 11.2(L) 11.7(L) 11.2(L)  Hematocrit 36.0 - 46.0 % 33.9(L) 35.7(L) 34.0(L)  Platelets 150 - 400 K/uL 253 251 -   . CBC    Component Value Date/Time   WBC 12.3 (H) 07/27/2016 1028   RBC 4.02 07/27/2016 1028   HGB 11.2 (L) 07/27/2016 1028   HCT 33.9 (L) 07/27/2016 1028   PLT 253 07/27/2016 1028   MCV 84.3 07/27/2016 1028   MCH 27.9 07/27/2016 1028   MCHC 33.0 07/27/2016 1028   RDW 14.1 07/27/2016 1028   LYMPHSABS 4.1 (H) 07/27/2016 1028   MONOABS 0.8 07/27/2016 1028   EOSABS 0.4 07/27/2016 1028   BASOSABS 0.0 07/27/2016 1028    . CMP Latest Ref Rng & Units  07/27/2016 05/20/2016 03/30/2016  Glucose 65 - 99 mg/dL 103(H) 118(H) 174(H)  BUN 6 - 20 mg/dL 23(H) 30(H) 24(H)  Creatinine 0.44 - 1.00 mg/dL 1.23(H) 1.59(H) 1.26(H)  Sodium 135 - 145 mmol/L 136 137 136  Potassium 3.5 - 5.1 mmol/L 3.9 3.6 3.5  Chloride 101 - 111 mmol/L 101 98(L) 99(L)  CO2 22 - 32 mmol/L 28 27 28   Calcium 8.9 - 10.3 mg/dL 8.6(L) 9.4 9.2  Total Protein 6.5 - 8.1 g/dL 7.9 - 8.2(H)  Total Bilirubin 0.3 - 1.2 mg/dL 0.5 - 0.4  Alkaline Phos 38 - 126 U/L 189(H) - 193(H)  AST 15 - 41 U/L 33 - 29  ALT 14 - 54 U/L 35 - 34    PATHOLOGY:   RADIOGRAPHIC STUDIES: I have personally reviewed the radiological images as listed and agreed with the findings in the report.  CT Abdomen Pelvis 12/26/2015 IMPRESSION: There is focal wall thickening in the distal stomach region. Question  localized distal gastritis. No other bowel wall thickening evident. No bowel obstruction. No diverticulitis. No free air or portal venous air. Scattered sigmoid diverticula are noted without inflammation.  No abscess.  Appendix appears normal.  Gallbladder and uterus absent.  Degenerative changes noted in the lumbar spine with spondylolisthesis L4-5. Focal spinal stenosis along the inferior aspect of L4-5 is noted, due to combination of disc protrusion, bony hypertrophy, and spondylolisthesis.  Oral contrast is seen in the distal esophagus, consistent with a degree of spontaneous gastroesophageal reflux.  There is mild aortoiliac atherosclerosis.  MRI Brain w/o Contrast 12/25/2015 IMPRESSION: 1. Unremarkable appearance of the brain and internal auditory canals. No evidence of vestibular schwannoma on this unenhanced study. 2. Trace bilateral mastoid fluid.  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 etiology IgG kappa monoclonal gammopathy 0.9 g/dl Eliquis, chronic  anticoagulation  We reviewed all of her studies to date. BMBX has approximately 9% plasma cells kappa restricted, given the definition of myeloma as detailed below Kyan does not meet the criteria. However, I feel that she needs close observation. We discussed return in 2 to 3 months with ongoing labs. She agrees. She will continue her Eliquis. She will continue to follow with nephrology.  She has not had a renal biopsy, this may be the next step and we can discuss with her nephrologist, Dr. Burnett Sheng.      // Labs reviewed. SPEP pending. Continue observation of her labs. There is a 1% chance per year of her MGUS progressing to multiple myeloma.  She will call Dr. Aline Brochure about her pain medication.    She will return for follow up in 4 months with labs. CBC, CMP, LDH, Beta 2 microglobulin, CRMP light chains, and SPEP at next visit.    No orders of the defined types were placed in this encounter.   All of the patients questions were answered with apparent satisfaction. The patient knows to call the clinic with any problems, questions or concerns.  This document serves as a record of services personally performed by Twana First, MD. It was created on her behalf by Martinique Casey, a trained medical scribe. The creation of this record is based on the scribe's personal observations and the provider's statements to them. This document has been checked and approved by the attending provider.  I have reviewed the above documentation for accuracy and completeness, and I agree with the above.  07/27/2016 11:29 AM

## 2016-07-27 NOTE — Patient Instructions (Signed)
[place] at [facility] Discharge Instructions  RECOMMENDATIONS MADE BY THE CONSULTANT AND ANY TEST RESULTS WILL BE SENT TO YOUR REFERRING PHYSICIAN.  If your labs are not changed we will watch you for the protein problem.   Appointment & labs in 6 months  Thank you for choosing [place] at [facility] to provide your oncology and hematology care.  To afford each patient quality time with our provider, please arrive at least 15 minutes before your scheduled appointment time.    If you have a lab appointment with the [facility] please come in thru the  Main Entrance and check in at the main information desk  You need to re-schedule your appointment should you arrive 10 or more minutes late.  We strive to give you quality time with our providers, and arriving late affects you and other patients whose appointments are after yours.  Also, if you no show three or more times for appointments you may be dismissed from the clinic at the providers discretion.     Again, thank you for choosing [facility].  Our hope is that these requests will decrease the amount of time that you wait before being seen by our physicians.       _____________________________________________________________  Should you have questions after your visit to [facility], please contact our office at [phone] between the hours of 8:30 a.m. and 4:30 p.m.  Voicemails left after 4:30 p.m. will not be returned until the following business day.  For prescription refill requests, have your pharmacy contact our office.       Resources For Cancer Patients and their Caregivers ? American Cancer Society: Can assist with transportation, wigs, general needs, runs Look Good Feel Better.        ([phone] ? Cancer Care: Provides financial assistance, online support groups, medication/co-pay assistance.  1-800-813-HOPE 785-732-9202) ? [facility] Assists [place] cancer patients and their families through emotional , educational and financial support.  215 853 4191 ? [place] DSS Where to apply for food stamps, Medicaid and utility assistance. 814-461-9561 ? [facility]: Transportation to medical appointments. 726-511-6529 ? Social Security Administration: May apply for disability if have a Stage IV cancer. (519)363-2710 6507034356 ? [employer], Disability and Transit Services: Assists with nutrition, care and transit needs. [place] Support Programs: @10RELATIVEDAYS @ > Cancer Support Group  2nd Tuesday of the month 1pm-2pm, Journey Room  > Creative Journey  3rd Tuesday of the month 1130am-1pm, Journey Room  > Look Good Feel Better  1st Wednesday of the month 10am-12 noon, Journey Room (Call [place] to register [phone])

## 2016-07-28 LAB — PROTEIN ELECTROPHORESIS, SERUM
A/G RATIO SPE: 0.7 (ref 0.7–1.7)
ALBUMIN ELP: 3.1 g/dL (ref 2.9–4.4)
ALPHA-1-GLOBULIN: 0.3 g/dL (ref 0.0–0.4)
ALPHA-2-GLOBULIN: 0.9 g/dL (ref 0.4–1.0)
Beta Globulin: 1.1 g/dL (ref 0.7–1.3)
GLOBULIN, TOTAL: 4.4 g/dL — AB (ref 2.2–3.9)
Gamma Globulin: 2.1 g/dL — ABNORMAL HIGH (ref 0.4–1.8)
M-Spike, %: 1.5 g/dL — ABNORMAL HIGH
Total Protein ELP: 7.5 g/dL (ref 6.0–8.5)

## 2016-07-28 LAB — KAPPA/LAMBDA LIGHT CHAINS
Kappa free light chain: 479.2 mg/L — ABNORMAL HIGH (ref 3.3–19.4)
Kappa, lambda light chain ratio: 23.15 — ABNORMAL HIGH (ref 0.26–1.65)
Lambda free light chains: 20.7 mg/L (ref 5.7–26.3)

## 2016-07-28 LAB — BETA 2 MICROGLOBULIN, SERUM: Beta-2 Microglobulin: 3.5 mg/L — ABNORMAL HIGH (ref 0.6–2.4)

## 2016-08-03 DIAGNOSIS — J45901 Unspecified asthma with (acute) exacerbation: Secondary | ICD-10-CM | POA: Diagnosis not present

## 2016-08-03 DIAGNOSIS — H672 Otitis media in diseases classified elsewhere, left ear: Secondary | ICD-10-CM | POA: Diagnosis not present

## 2016-08-03 DIAGNOSIS — G894 Chronic pain syndrome: Secondary | ICD-10-CM | POA: Diagnosis not present

## 2016-08-24 ENCOUNTER — Other Ambulatory Visit (HOSPITAL_COMMUNITY)
Admission: RE | Admit: 2016-08-24 | Discharge: 2016-08-24 | Disposition: A | Discharge status: Home / Self Care | Payer: Medicare Other | Source: Ambulatory Visit | Attending: Nephrology | Admitting: Nephrology

## 2016-08-24 DIAGNOSIS — Z79899 Other long term (current) drug therapy: Secondary | ICD-10-CM | POA: Diagnosis not present

## 2016-08-24 DIAGNOSIS — D509 Iron deficiency anemia, unspecified: Secondary | ICD-10-CM | POA: Insufficient documentation

## 2016-08-24 DIAGNOSIS — I10 Essential (primary) hypertension: Secondary | ICD-10-CM | POA: Insufficient documentation

## 2016-08-24 DIAGNOSIS — E559 Vitamin D deficiency, unspecified: Secondary | ICD-10-CM | POA: Insufficient documentation

## 2016-08-24 DIAGNOSIS — R809 Proteinuria, unspecified: Secondary | ICD-10-CM | POA: Insufficient documentation

## 2016-08-24 DIAGNOSIS — N183 Chronic kidney disease, stage 3 (moderate): Secondary | ICD-10-CM | POA: Insufficient documentation

## 2016-08-24 DIAGNOSIS — L239 Allergic contact dermatitis, unspecified cause: Secondary | ICD-10-CM | POA: Diagnosis not present

## 2016-08-24 DIAGNOSIS — Z0182 Encounter for allergy testing: Secondary | ICD-10-CM | POA: Diagnosis not present

## 2016-08-24 LAB — CBC WITH DIFFERENTIAL/PLATELET
BASOS PCT: 0 %
Basophils Absolute: 0 10*3/uL (ref 0.0–0.1)
EOS ABS: 0.5 10*3/uL (ref 0.0–0.7)
Eosinophils Relative: 4 %
HCT: 34.8 % — ABNORMAL LOW (ref 36.0–46.0)
HEMOGLOBIN: 11.5 g/dL — AB (ref 12.0–15.0)
LYMPHS ABS: 5.3 10*3/uL — AB (ref 0.7–4.0)
Lymphocytes Relative: 38 %
MCH: 27.4 pg (ref 26.0–34.0)
MCHC: 33 g/dL (ref 30.0–36.0)
MCV: 83.1 fL (ref 78.0–100.0)
Monocytes Absolute: 0.7 10*3/uL (ref 0.1–1.0)
Monocytes Relative: 5 %
NEUTROS PCT: 53 %
Neutro Abs: 7.4 10*3/uL (ref 1.7–7.7)
Platelets: 241 10*3/uL (ref 150–400)
RBC: 4.19 MIL/uL (ref 3.87–5.11)
RDW: 14.3 % (ref 11.5–15.5)
WBC: 13.8 10*3/uL — AB (ref 4.0–10.5)

## 2016-08-24 LAB — COMPREHENSIVE METABOLIC PANEL
ALT: 55 U/L — AB (ref 14–54)
ANION GAP: 8 (ref 5–15)
AST: 36 U/L (ref 15–41)
Albumin: 3.3 g/dL — ABNORMAL LOW (ref 3.5–5.0)
Alkaline Phosphatase: 240 U/L — ABNORMAL HIGH (ref 38–126)
BUN: 34 mg/dL — ABNORMAL HIGH (ref 6–20)
CALCIUM: 9.1 mg/dL (ref 8.9–10.3)
CHLORIDE: 95 mmol/L — AB (ref 101–111)
CO2: 32 mmol/L (ref 22–32)
CREATININE: 1.62 mg/dL — AB (ref 0.44–1.00)
GFR, EST AFRICAN AMERICAN: 37 mL/min — AB (ref >60)
GFR, EST NON AFRICAN AMERICAN: 32 mL/min — AB (ref >60)
Glucose, Bld: 99 mg/dL (ref 65–99)
Potassium: 3.4 mmol/L — ABNORMAL LOW (ref 3.5–5.1)
Sodium: 135 mmol/L (ref 135–145)
Total Bilirubin: 0.6 mg/dL (ref 0.3–1.2)
Total Protein: 8.3 g/dL — ABNORMAL HIGH (ref 6.5–8.1)

## 2016-08-24 LAB — PHOSPHORUS: Phosphorus: 4 mg/dL (ref 2.5–4.6)

## 2016-08-24 LAB — IRON AND TIBC
IRON: 65 ug/dL (ref 28–170)
Saturation Ratios: 22 % (ref 10.4–31.8)
TIBC: 301 ug/dL (ref 250–450)
UIBC: 236 ug/dL

## 2016-08-24 LAB — PROTEIN / CREATININE RATIO, URINE
Creatinine, Urine: 69.63 mg/dL
Protein Creatinine Ratio: 0.09 mg/mg{Cre} (ref 0.00–0.15)
Total Protein, Urine: 6 mg/dL

## 2016-08-24 LAB — FERRITIN: FERRITIN: 100 ng/mL (ref 11–307)

## 2016-08-25 LAB — PTH, INTACT AND CALCIUM
CALCIUM TOTAL (PTH): 8.9 mg/dL (ref 8.7–10.3)
PTH: 43 pg/mL (ref 15–65)

## 2016-08-25 LAB — VITAMIN D 25 HYDROXY (VIT D DEFICIENCY, FRACTURES): VIT D 25 HYDROXY: 26 ng/mL — AB (ref 30.0–100.0)

## 2016-08-26 DIAGNOSIS — E1129 Type 2 diabetes mellitus with other diabetic kidney complication: Secondary | ICD-10-CM | POA: Diagnosis not present

## 2016-08-26 DIAGNOSIS — R809 Proteinuria, unspecified: Secondary | ICD-10-CM | POA: Diagnosis not present

## 2016-08-26 DIAGNOSIS — N183 Chronic kidney disease, stage 3 (moderate): Secondary | ICD-10-CM | POA: Diagnosis not present

## 2016-08-26 DIAGNOSIS — D649 Anemia, unspecified: Secondary | ICD-10-CM | POA: Diagnosis not present

## 2016-08-26 DIAGNOSIS — N250 Renal osteodystrophy: Secondary | ICD-10-CM | POA: Diagnosis not present

## 2016-08-28 DIAGNOSIS — L249 Irritant contact dermatitis, unspecified cause: Secondary | ICD-10-CM | POA: Diagnosis not present

## 2016-08-28 DIAGNOSIS — Z0182 Encounter for allergy testing: Secondary | ICD-10-CM | POA: Diagnosis not present

## 2016-08-28 DIAGNOSIS — L239 Allergic contact dermatitis, unspecified cause: Secondary | ICD-10-CM | POA: Diagnosis not present

## 2016-08-31 DIAGNOSIS — G894 Chronic pain syndrome: Secondary | ICD-10-CM | POA: Diagnosis not present

## 2016-08-31 DIAGNOSIS — I959 Hypotension, unspecified: Secondary | ICD-10-CM | POA: Diagnosis not present

## 2016-08-31 DIAGNOSIS — Z79899 Other long term (current) drug therapy: Secondary | ICD-10-CM | POA: Diagnosis not present

## 2016-08-31 DIAGNOSIS — M47817 Spondylosis without myelopathy or radiculopathy, lumbosacral region: Secondary | ICD-10-CM | POA: Diagnosis not present

## 2016-08-31 DIAGNOSIS — M48061 Spinal stenosis, lumbar region without neurogenic claudication: Secondary | ICD-10-CM | POA: Diagnosis not present

## 2016-08-31 DIAGNOSIS — M4686 Other specified inflammatory spondylopathies, lumbar region: Secondary | ICD-10-CM | POA: Diagnosis not present

## 2016-08-31 DIAGNOSIS — E1121 Type 2 diabetes mellitus with diabetic nephropathy: Secondary | ICD-10-CM | POA: Diagnosis not present

## 2016-08-31 DIAGNOSIS — M5441 Lumbago with sciatica, right side: Secondary | ICD-10-CM | POA: Diagnosis not present

## 2016-09-07 DIAGNOSIS — G894 Chronic pain syndrome: Secondary | ICD-10-CM | POA: Diagnosis not present

## 2016-09-07 DIAGNOSIS — Z79899 Other long term (current) drug therapy: Secondary | ICD-10-CM | POA: Diagnosis not present

## 2016-09-07 DIAGNOSIS — E1121 Type 2 diabetes mellitus with diabetic nephropathy: Secondary | ICD-10-CM | POA: Diagnosis not present

## 2016-09-07 DIAGNOSIS — M5441 Lumbago with sciatica, right side: Secondary | ICD-10-CM | POA: Diagnosis not present

## 2016-09-07 DIAGNOSIS — I10 Essential (primary) hypertension: Secondary | ICD-10-CM | POA: Diagnosis not present

## 2016-09-21 ENCOUNTER — Ambulatory Visit (INDEPENDENT_AMBULATORY_CARE_PROVIDER_SITE_OTHER): Payer: Medicare Other | Admitting: Gastroenterology

## 2016-09-21 VITALS — BP 113/74 | HR 87 | Temp 97.8°F | Ht 67.5 in | Wt 235.8 lb

## 2016-09-21 DIAGNOSIS — R748 Abnormal levels of other serum enzymes: Secondary | ICD-10-CM

## 2016-09-21 DIAGNOSIS — K5900 Constipation, unspecified: Secondary | ICD-10-CM

## 2016-09-21 DIAGNOSIS — K219 Gastro-esophageal reflux disease without esophagitis: Secondary | ICD-10-CM

## 2016-09-21 NOTE — Assessment & Plan Note (Signed)
Diarrhea with Linzess 145 if taking daily, otherwise still with constipation. Trial Amitiza 24 mcg po daily at dinner. Increase to BID if needed. Samples provided. 3 month follow-up.

## 2016-09-21 NOTE — Assessment & Plan Note (Signed)
Chronic. Liver biopsy with mild fatty liver, no significant fibrosis, mild chronic hepatitis, non-specific findings and query drug-induced. Negative AMA, ANA, moderately positive ASMA in past. Recheck AMA now.

## 2016-09-21 NOTE — Assessment & Plan Note (Signed)
Continue Protonix once daily.  

## 2016-09-21 NOTE — Progress Notes (Signed)
Referring Provider: Jani Gravel, MD Primary Care Physician:  Jani Gravel, MD Primary GI: Dr. Gala Romney   Chief Complaint  Patient presents with  . elevated alkaline phosphatase    f/u, doing ok    HPI:   Stephanie [name] is a 66 y.o. female presenting today with a history of isolated elevation of alk phos, normal transaminases. Recent EGD/TCS on file due to heme positive stool. Diverticulosis on colonoscopy and EGD normal with empiric dilation.   Thorough work-up of elevated alk phos in setting of fatty liver with AMA, ANA negative. ASMA moderately positive. Negative Hep B, C, A. Followed by oncology with history of nephrotic syndrome, IgG kappa monoclonal gammopathy, observed for now. Liver biopsy with mild chronic hepatitis, mild fatty liver, non significant fibrosis, non-specific findigns and likely secondary to drug-induced etiology.   Linzess 145 prn but with unproductive bowel movements, Bristol stool scale 1-3. If takes Linzess 145 mcg daily will have diarrhea. Sometimes straining. No rectal bleeding. Abdominal bloating. Rare abdominal pain but not that much. Protonix once daily. Rare occasions of feeling like she is choking.   Going to have injections in back for spinal stenosis. Has radiating pain down legs and sometimes up into her neck. Friday has the appt in the surgicenter in [place].    Past Medical History:  Diagnosis Date  . Anemia   . Arthritis   . Asthma   . COPD (chronic obstructive pulmonary disease) ([place])   . Deep vein thrombosis (DVT) of both lower extremities ([place]) 06/27/2015  . Diabetes mellitus   . Fibromyalgia   . GERD (gastroesophageal reflux disease)   . H/O hiatal hernia   . Hypercholesteremia   . Hypertension   . Hyperthyroidism   . IBS (irritable bowel syndrome)   . Inner ear disease   . 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 PONV (postoperative nausea and vomiting)   . Tachycardia     Past Surgical History:  Procedure  Laterality Date  . ABDOMINAL HYSTERECTOMY  partial  . CARPAL TUNNEL RELEASE Right 1991  . CATARACT EXTRACTION W/PHACO Right 05/08/2013   Procedure: CATARACT EXTRACTION PHACO AND INTRAOCULAR LENS PLACEMENT (IOC);  Surgeon: Tonny Branch, MD;  Location: [facility];  Service: Ophthalmology;  Laterality: Right;  CDE 10.31  . CATARACT EXTRACTION W/PHACO Left 08/17/2013   Procedure: CATARACT EXTRACTION PHACO AND INTRAOCULAR LENS PLACEMENT (IOC);  Surgeon: Tonny Branch, MD;  Location: [facility];  Service: Ophthalmology;  Laterality: Left;  CDE:9.03  . CHOLECYSTECTOMY    . COLONOSCOPY WITH PROPOFOL N/A 01/06/2016   Dr. Gala Romney: diverticulosis   . DENTAL SURGERY    . ESOPHAGOGASTRODUODENOSCOPY (EGD) WITH PROPOFOL N/A 01/06/2016   Dr. Gala Romney: normal s/p empiric dilation   . MALONEY DILATION N/A 01/06/2016   Procedure: Venia Minks DILATION;  Surgeon: Daneil Dolin, MD;  Location: [facility];  Service: Endoscopy;  Laterality: N/A;  . WRIST GANGLION EXCISION Left     Current Outpatient Prescriptions  Medication Sig Dispense Refill  . acetaminophen (TYLENOL) 500 MG tablet Take 500 mg by mouth every 6 (six) hours as needed.    Marland Kitchen albuterol (PROVENTIL HFA;VENTOLIN HFA) 108 (90 BASE) MCG/ACT inhaler Inhale 2 puffs into the lungs every 6 (six) hours as needed for wheezing or shortness of breath.    Marland Kitchen albuterol (PROVENTIL) (2.5 MG/3ML) 0.083% nebulizer solution Take 2.5 mg by nebulization every 6 (six) hours as needed for wheezing or shortness of breath.    Marland Kitchen apixaban (ELIQUIS) 5 MG TABS tablet  Take 10 mg by mouth BID through Friday 5/19 evening. Take 5m by mouth BID starting 5/20 AM. (Patient taking differently: Take 5 mg by mouth 2 (two) times daily. ) 60 tablet 3  . Benzonatate (TESSALON PERLES PO) Take 1-2 capsules by mouth as needed.     . Bilberry, Vaccinium myrtillus, 100 MG CAPS Take 1 capsule by mouth 2 (two) times daily.    . cetirizine (ZYRTEC) 10 MG tablet Take 10 mg by mouth daily.    . Cholecalciferol 4000 units  CAPS Take 1 capsule by mouth daily.    .Marland KitchenCINNAMON PO Take 2-4 capsules by mouth 2 (two) times daily as needed (high blood sugar).     . clobetasol ointment (TEMOVATE) 00.24% Apply 1 application topically 2 (two) times daily as needed (rash).     .Marland KitchenCod Liver Oil CAPS Take 1 capsule by mouth daily.    . Cyanocobalamin (VITAMIN B-12 PO) Take 1 tablet by mouth daily.      . diphenhydrAMINE (BENADRYL) 25 MG tablet Take 25 mg by mouth at bedtime as needed.    . Doxepin HCl 5 % CREA apply 2 grams (2 grams=2 inches) to affected area(s) 3 times daily.  5  . DULoxetine (CYMBALTA) 30 MG capsule Take 30 mg by mouth 2 (two) times daily.     . fluticasone (VERAMYST) 27.5 MCG/SPRAY nasal spray Place 2 sprays into the nose as needed.    . Fluticasone Furoate (ARNUITY ELLIPTA IN) Inhale 1 puff into the lungs daily.    .Marland KitchenHUMALOG KWIKPEN 100 UNIT/ML KiwkPen Inject 30 Units into the skin 3 (three) times daily. Hold if blood sugar is 130 or lower    . hydrOXYzine (ATARAX/VISTARIL) 25 MG tablet Take 25 mg by mouth every 8 (eight) hours as needed for itching.     . Insulin Glargine (LANTUS SOLOSTAR) 100 UNIT/ML Solostar Pen Inject 75 Units into the skin 2 (two) times daily.    .Marland Kitchenlevothyroxine (SYNTHROID, LEVOTHROID) 100 MCG tablet Take 100 mcg by mouth daily before breakfast.   0  . lidocaine (XYLOCAINE) 5 % ointment APPLY 2-3 GRAMS (1 GRAM=1 INCH) TO AFFECTED AREA 4 TIMES DAILY  5  . linaclotide (LINZESS) 145 MCG CAPS capsule Take 1 capsule (145 mcg total) by mouth daily before breakfast. (Patient taking differently: Take 145 mcg by mouth as needed. ) 30 capsule 11  . linagliptin (TRADJENTA) 5 MG TABS tablet Take 5 mg by mouth daily.    .Marland Kitchenlisinopril (PRINIVIL,ZESTRIL) 2.5 MG tablet Take 2.5 mg by mouth daily.    .Marland KitchenLYRICA 75 MG capsule Take 75 mg by mouth 2 (two) times daily.     . Melatonin 5 MG CAPS Take 5-10 mg by mouth at bedtime.     . metolazone (ZAROXOLYN) 5 MG tablet Take 1 tablet (5 mg total) by mouth 2 (two)  times daily. 60 tablet 0  . mometasone (ELOCON) 0.1 % cream Apply 1 application topically daily as needed (rash).     . mupirocin ointment (BACTROBAN) 2 % Apply 1 application topically 2 (two) times daily as needed (irritation).     . NONFORMULARY OR COMPOUNDED ITEM APPLY 1 GRAM ( 1 GRAM = 1 PUMP) TO THE AFFECTED AREA 4 TIMES A DAY  5  . pantoprazole (PROTONIX) 40 MG tablet Take 40 mg by mouth daily.     . pravastatin (PRAVACHOL) 40 MG tablet Take 60 mg by mouth every evening.     . prednisoLONE acetate (PRED FORTE) 1 %  ophthalmic suspension 4 drops 2 (two) times daily as needed. Use for left ear pain    . tiZANidine (ZANAFLEX) 2 MG tablet Take 2 mg by mouth every 6 (six) hours as needed for muscle spasms.    Marland Kitchen torsemide (DEMADEX) 20 MG tablet Take 2 tablets (40 mg total) by mouth daily. (Patient taking differently: Take 20-40 mg by mouth daily. May take an additional  71ms as needed for swelling) 60 tablet 0  . traMADol (ULTRAM) 50 MG tablet Take 1 tablet (50 mg total) by mouth every 6 (six) hours as needed. (Patient taking differently: Take 50 mg by mouth every 8 (eight) hours as needed. ) 16 tablet 0  . vitamin C (ASCORBIC ACID) 500 MG tablet Take 1,000 mg by mouth daily.    . vitamin E 400 UNIT capsule Take 400 Units by mouth daily.    . Cholecalciferol (VITAMIN D3) 5000 units TABS Take 5,000 Units by mouth daily.     . metoprolol (LOPRESSOR) 100 MG tablet TAKE 1 TABLET BY MOUTH TWICE DAILY **TAKE WITH 25MG TABLET FOR TOTAL DOSAGE OF 125MG** (Patient not taking: Reported on 09/21/2016) 180 tablet 1  . metoprolol (LOPRESSOR) 100 MG tablet TAKE 1 TABLET BY MOUTH TWICE DAILY **TAKE WITH 25MG TABLET FOR TOTAL DOSAGE OF 125MG** (Patient not taking: Reported on 09/21/2016) 180 tablet 1  . metoprolol tartrate (LOPRESSOR) 25 MG tablet Take 25 mg by mouth 2 (two) times daily.    . metoprolol tartrate (LOPRESSOR) 25 MG tablet TAKE 1 TABLET BY MOUTH TWICE DAILY. (TAKE WITH 100MG TABLET FOR TOTAL DOSAGE OF  125MG)(Patient not taking: Reported on 09/21/2016) 180 tablet 3  . PULMICORT FLEXHALER 180 MCG/ACT inhaler Inhale 2 puffs into the lungs daily as needed (for shortness of breath-wheezing).      No current facility-administered medications for this visit.     Allergies as of 09/21/2016 - Review Complete 09/21/2016  Allergen Reaction Noted  . Tetracyclines & related Anaphylaxis 09/19/2010  . Banana Hives and Nausea And Vomiting 05/04/2013  . Penicillins Rash 09/19/2010    Family History  Problem Relation Age of Onset  . Colon cancer Neg Hx     Social History   Social History  . Marital status: Married    Spouse name: N/A  . Number of children: N/A  . Years of education: N/A   Social History Main Topics  . Smoking status: Former Smoker    Packs/day: 0.25    Years: 30.00    Types: Cigarettes    Quit date: 02/17/2014  . Smokeless tobacco: Never Used     Comment: some day smoker  . Alcohol use No  . Drug use: No  . Sexual activity: No   Other Topics Concern  . Not on file   Social History Narrative  . No narrative on file    Review of Systems: As mentioned in HPI   Physical Exam: BP 113/74   Pulse 87   Temp 97.8 F (36.6 C) (Oral)   Ht 5' 7.5" (1.715 m)   Wt 235 lb 12.8 oz (107 kg)   BMI 36.39 kg/m  General:   Alert and oriented. No distress noted. Pleasant and cooperative.  Head:  Normocephalic and atraumatic. Eyes:  Conjuctiva clear without scleral icterus. Abdomen:  +BS, soft, non-tender and non-distended. No rebound or guarding. No HSM or masses noted. Msk:  Symmetrical without gross deformities. Normal posture. Extremities:  Without edema. Neurologic:  Alert and  oriented x4 Psych:  Alert and cooperative. Normal  mood and affect.

## 2016-09-21 NOTE — Patient Instructions (Signed)
I have given you Amitiza gelcaps to take once each evening with dinner. Increase this to twice a day if needed. A classic side effect is nausea IF not taken with food. Let me know how you do with this!  Please have blood work done next week when you do the rest of the blood work.  We will see you in 3 months!

## 2016-09-22 NOTE — Progress Notes (Signed)
cc'ed to pcp °

## 2016-09-25 DIAGNOSIS — M5136 Other intervertebral disc degeneration, lumbar region: Secondary | ICD-10-CM | POA: Diagnosis not present

## 2016-09-25 DIAGNOSIS — M5137 Other intervertebral disc degeneration, lumbosacral region: Secondary | ICD-10-CM | POA: Diagnosis not present

## 2016-09-25 DIAGNOSIS — M545 Low back pain: Secondary | ICD-10-CM | POA: Diagnosis not present

## 2016-10-02 ENCOUNTER — Telehealth: Payer: Self-pay | Admitting: Internal Medicine

## 2016-10-02 MED ORDER — LUBIPROSTONE 24 MCG PO CAPS: ORAL_CAPSULE | ORAL | 3 refills | Status: DC

## 2016-10-02 NOTE — Addendum Note (Signed)
Addended by: Mahala Menghini on: 10/02/2016 12:41 PM   Modules accepted: Orders

## 2016-10-02 NOTE — Telephone Encounter (Signed)
PATIENT CALLED SAY AMITIZA 24 MG IS WORKING AND WOULD LIKE A PRESCRIPTION SENT TO [facility] IN [place]

## 2016-10-02 NOTE — Telephone Encounter (Signed)
Routing to the refill box. 

## 2016-10-05 DIAGNOSIS — M4727 Other spondylosis with radiculopathy, lumbosacral region: Secondary | ICD-10-CM | POA: Diagnosis not present

## 2016-10-05 DIAGNOSIS — M48061 Spinal stenosis, lumbar region without neurogenic claudication: Secondary | ICD-10-CM | POA: Diagnosis not present

## 2016-10-05 DIAGNOSIS — M4686 Other specified inflammatory spondylopathies, lumbar region: Secondary | ICD-10-CM | POA: Diagnosis not present

## 2016-10-08 ENCOUNTER — Other Ambulatory Visit (HOSPITAL_COMMUNITY)
Admission: RE | Admit: 2016-10-08 | Discharge: 2016-10-08 | Disposition: A | Discharge status: Home / Self Care | Payer: Medicare Other | Source: Ambulatory Visit | Attending: Gastroenterology | Admitting: Gastroenterology

## 2016-10-08 ENCOUNTER — Other Ambulatory Visit (HOSPITAL_COMMUNITY)
Admission: RE | Admit: 2016-10-08 | Discharge: 2016-10-08 | Disposition: A | Discharge status: Home / Self Care | Payer: Medicare Other | Source: Ambulatory Visit | Attending: Internal Medicine | Admitting: Internal Medicine

## 2016-10-08 DIAGNOSIS — R748 Abnormal levels of other serum enzymes: Secondary | ICD-10-CM | POA: Insufficient documentation

## 2016-10-08 DIAGNOSIS — E119 Type 2 diabetes mellitus without complications: Secondary | ICD-10-CM | POA: Diagnosis not present

## 2016-10-08 LAB — HEMOGLOBIN A1C
HEMOGLOBIN A1C: 8.1 % — AB (ref 4.8–5.6)
Mean Plasma Glucose: 185.77 mg/dL

## 2016-10-10 LAB — MITOCHONDRIAL ANTIBODIES: MITOCHONDRIAL M2 AB, IGG: 4.5 U (ref 0.0–20.0)

## 2016-10-12 DIAGNOSIS — I10 Essential (primary) hypertension: Secondary | ICD-10-CM | POA: Diagnosis not present

## 2016-10-12 DIAGNOSIS — E1140 Type 2 diabetes mellitus with diabetic neuropathy, unspecified: Secondary | ICD-10-CM | POA: Diagnosis not present

## 2016-10-12 DIAGNOSIS — G894 Chronic pain syndrome: Secondary | ICD-10-CM | POA: Diagnosis not present

## 2016-10-12 DIAGNOSIS — Z79899 Other long term (current) drug therapy: Secondary | ICD-10-CM | POA: Diagnosis not present

## 2016-10-15 ENCOUNTER — Ambulatory Visit (HOSPITAL_COMMUNITY): Payer: Medicare Other | Attending: Internal Medicine | Admitting: Physical Therapy

## 2016-10-15 ENCOUNTER — Encounter (HOSPITAL_COMMUNITY): Payer: Self-pay | Admitting: Physical Therapy

## 2016-10-15 DIAGNOSIS — M6281 Muscle weakness (generalized): Secondary | ICD-10-CM | POA: Insufficient documentation

## 2016-10-15 DIAGNOSIS — R262 Difficulty in walking, not elsewhere classified: Secondary | ICD-10-CM | POA: Diagnosis not present

## 2016-10-15 DIAGNOSIS — R293 Abnormal posture: Secondary | ICD-10-CM | POA: Insufficient documentation

## 2016-10-15 DIAGNOSIS — M5441 Lumbago with sciatica, right side: Secondary | ICD-10-CM | POA: Diagnosis not present

## 2016-10-15 DIAGNOSIS — R29898 Other symptoms and signs involving the musculoskeletal system: Secondary | ICD-10-CM | POA: Insufficient documentation

## 2016-10-15 DIAGNOSIS — G8929 Other chronic pain: Secondary | ICD-10-CM | POA: Diagnosis not present

## 2016-10-15 NOTE — Therapy (Signed)
[place], [place], [address] Phone: [phone]   Fax:  [phone]  Physical Therapy Evaluation  Patient Details  Name: Stephanie [name] MRN: [id number] Date of Birth: 01/22/1951 Referring Provider: Darla Lesches Mayo   Encounter Date: 10/15/2016      PT End of Session - 10/15/16 1127    Visit Number 1   Number of Visits 13   Date for PT Re-Evaluation 11/05/16   Authorization Type [employer] Medicare/Medicaid    Authorization Time Period 10/15/16 to 11/26/16   Authorization - Visit Number 1   Authorization - Number of Visits 10   PT Start Time 5625   PT Stop Time 1112   PT Time Calculation (min) 40 min   Activity Tolerance Patient tolerated treatment well   Behavior During Therapy [facility] for tasks assessed/performed      Past Medical History:  Diagnosis Date  . Anemia   . Arthritis   . Asthma   . COPD (chronic obstructive pulmonary disease) ([place])   . Deep vein thrombosis (DVT) of both lower extremities ([place]) 06/27/2015  . Diabetes mellitus   . Fibromyalgia   . GERD (gastroesophageal reflux disease)   . H/O hiatal hernia   . Hypercholesteremia   . Hypertension   . Hyperthyroidism   . IBS (irritable bowel syndrome)   . Inner ear disease   . 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 PONV (postoperative nausea and vomiting)   . Tachycardia     Past Surgical History:  Procedure Laterality Date  . ABDOMINAL HYSTERECTOMY  partial  . CARPAL TUNNEL RELEASE Right 1991  . CATARACT EXTRACTION W/PHACO Right 05/08/2013   Procedure: CATARACT EXTRACTION PHACO AND INTRAOCULAR LENS PLACEMENT (IOC);  Surgeon: Tonny Branch, MD;  Location: [facility];  Service: Ophthalmology;  Laterality: Right;  CDE 10.31  . CATARACT EXTRACTION W/PHACO Left 08/17/2013   Procedure: CATARACT EXTRACTION PHACO AND INTRAOCULAR LENS PLACEMENT (IOC);  Surgeon: Tonny Branch, MD;  Location: [facility];  Service: Ophthalmology;  Laterality:  Left;  CDE:9.03  . CHOLECYSTECTOMY    . COLONOSCOPY WITH PROPOFOL N/A 01/06/2016   Dr. Gala Romney: diverticulosis   . DENTAL SURGERY    . ESOPHAGOGASTRODUODENOSCOPY (EGD) WITH PROPOFOL N/A 01/06/2016   Dr. Gala Romney: normal s/p empiric dilation   . MALONEY DILATION N/A 01/06/2016   Procedure: Venia Minks DILATION;  Surgeon: Daneil Dolin, MD;  [facility];  Service: Endoscopy;  Laterality: N/A;  . WRIST GANGLION EXCISION Left     There were no vitals filed for this visit.       Subjective Assessment - 10/15/16 1034    Subjective Patient states that she has been having back pain for quite some time now; she states she was told she has some pinched nerves. Her pain was going down her legs and it has also started travelling up to her neck and down both arms. If her pain is bad, she can be severely limited, she almost feels paralyzed when she has a bad muscle spasm. She agrees with all activites PT lists that might cause pain but unable to list any more; she had an injection in her back which helped some, muscle relaxers do not help much. No falls or close calls recently.    How long can you sit comfortably? at worst, it limits her but "I have learned to tolerate it"    How long can you stand comfortably? sometimes it is not a problem, on a bad  day maybe 5 minutes    How long can you walk comfortably? sometimes it can be a problem, on a bad day needs to take more breaks; unable to identify max distance    Diagnostic tests anterolisthesis L4-L5/L5-S1, spinal stenosis, endplate spurring   Patient Stated Goals be able to walk like she was before when she was working    Currently in Pain? Yes   Pain Score 5   at its worst "beyond 10/10" but denies any signs/symptoms/severity that would qualifty as 10/10    Pain Location Back   Pain Orientation Lower;Mid;Upper;Right   Pain Descriptors / Indicators Throbbing;Numbness;Tingling   Pain Type Chronic pain   Pain Radiating Towards can go all the way  down R LE    Pain Onset More than a month ago   Pain Frequency Constant   Aggravating Factors  bending, standing too long    Pain Relieving Factors unsture    Effect of Pain on Daily Activities severe             [facility] PT Assessment - 10/15/16 0001      Assessment   Medical Diagnosis R sided lowb ack pain with sciatica    Referring Provider Darla Lesches Mayo    Onset Date/Surgical Date --  chronic    Next MD Visit Dr. Brett Albino on the 17th    Prior Therapy PT in the past about 2010     Precautions   Precautions None     Restrictions   Weight Bearing Restrictions No     Balance Screen   Has the patient fallen in the past 6 months No   Has the patient had a decrease in activity level because of a fear of falling?  No   Is the patient reluctant to leave their home because of a fear of falling?  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      Observation/Other Assessments   Observations scour (+) R, FABER (+) B, SLR (+) with ipsilateral pain      AROM   Lumbar Flexion approx 50% limited; RFIS no change    Lumbar Extension moderate limitation, painful    Lumbar - Right Side Bend WFL but tight HS    Lumbar - Left Side Bend WFL but tight HS      Strength   Right Hip Flexion 3/5   Right Hip ABduction 4/5   Left Hip Flexion 3+/5   Left Hip ABduction 3/5   Right Knee Flexion 4+/5   Right Knee Extension 4+/5   Left Knee Flexion 4+/5   Left Knee Extension 4+/5   Right Ankle Dorsiflexion 5/5   Left Ankle Dorsiflexion 5/5     Flexibility   Hamstrings mild limitaion B    Piriformis moderate limitation B      Palpation   Palpation comment TTP and muscle knotting noted B paraspinals from high thoracic all teh way down to bilateral glutes and piriformis muscles              Objective measurements completed on examination: See above findings.          [facility] Adult PT Treatment/Exercise - 10/15/16 0001      Exercises   Exercises Lumbar     Lumbar Exercises: Stretches   Single Knee to Chest  Stretch 3 reps;10 seconds   Lower Trunk Rotation 3 reps;10 seconds     Lumbar Exercises: Supine   Ab Set 10 reps;3 seconds   Bridge 10 reps   Bridge Limitations UE press down                 PT Education - 10/15/16 1123    Education provided Yes   Education Details prognosis, exam findings, POC, HEP; brace does not appear to fit correctly/strongly recommend weaning out of brace as it may be assisting in making core weaker/increasing back pain   Person(s) Educated Patient   Methods Explanation;Demonstration;Handout   Comprehension Verbalized understanding;Returned demonstration;Need further instruction          PT Short Term Goals - 10/15/16 1137      PT SHORT TERM GOAL #1   Title Patient to be able to perform all functional mobility, including bed mobility tasks, with correct mechanics and to prevent exacerbation of low back pain    Time 3   Period Weeks   Status New   Target Date 11/05/16     PT SHORT TERM GOAL #2   Title Patient to experience pain as being no more than 6/10 at worst and with symptoms only going down to knee on R LE in order to improve QOL and functional task tolerance    Time 3   Period Weeks   Status New     PT SHORT TERM GOAL #3   Title Patient to demonstrate at least a 50% improvement in flexibility in hamstrings, piriformis, spinal paraspinals,and hip flexor groups in order to improve mechanics and reduce pain with mobility    Time 3   Period Weeks   Status New     PT SHORT TERM GOAL #4   Title Patient to be compliant with correct performance of HEP, to be updated as appropriate    Time 1   Period Weeks   Status New   Target Date 10/22/16           PT Long Term Goals - 10/15/16 1141       PT LONG TERM GOAL #1   Title Patient to demonstrate an improvement of at least 1 MMT grade in all tested groups in order to improve tolerance to walking and functional task performance    Time 6   Period Weeks   Status New   Target Date 11/26/16     PT LONG TERM GOAL #2   Title Patient to experience pain as being no more than 4/10 at worst with R LE pain no further than the R hip in order to improve QOL and functional task tolerance    Time 6   Period Weeks   Status New     PT LONG TERM GOAL #3   Title Patient to be participatory in regular walking program, at least 15 minutes in duration and 5 days per week, in order to promote regular activity and combat sednetary lifestyle    Time 6   Period Weeks   Status New     PT LONG TERM GOAL #4   Title Patient to be participatory in regular exericse program, at least 30 minutes in duration and 3 days per week, in order to maintain functional gains and prevent recurrence of condition    Time 6   Period Weeks   Status New                Plan - 10/15/16 1130  Clinical Impression Statement Patient arrives with complaints of back pain that she has been experiencing for the past 10-15 years or longer; note that she is currently wearing a back brace (no history of surgery) and has been for the last year or so only when she goes out, also note that the brace does not appear to fit her correctly and popped open multiple times during this evaluation. Examination reveals poor posture, poor functional strength including significant core weakness, impaired gait pattern, significant muscle spasm, poor lumbar and hip flexibility, difficulty with functional mobility, and reduced tolerance to PLOF based tasks. Recommend skilled PT services to address functional deficits, reduce pain, and assist in return to optimal level of function.    History and Personal Factors relevant to plan of care: chronicity of back pain, DM   Clinical Presentation Stable    Clinical Presentation due to: chronic deconditioning and poor mechanics, structural changes in spine such as spurring and anterolisthesis, chronic pain    Clinical Decision Making Low   Rehab Potential Good   Clinical Impairments Affecting Rehab Potential (+) motivated to participate; (-) obesity, chronicity of pain, chronic use of un-necessary bracing    PT Frequency 2x / week   PT Duration 6 weeks   PT Treatment/Interventions ADLs/Self Care Home Management;Biofeedback;Cryotherapy;Electrical Stimulation;Iontophoresis 20m/ml Dexamethasone;Moist Heat;Ultrasound;DME Instruction;Gait training;Stair training;Functional mobility training;Therapeutic activities;Therapeutic exercise;Balance training;Neuromuscular re-education;Patient/family education;Manual techniques;Energy conservation;Dry needling   PT Next Visit Plan review initial eval/goals, HEP; AVOID LUMBAR EXTENSION DUE TO ANTEROLISTHESIS. Flexion based program, core strength, LE strength. Continue to encourage discontinuation of brace.    PT Home Exercise Plan Eval: SKTC, LKTC, bridges without hyper-extension of spine, TA activation    Consulted and Agree with Plan of Care Patient      Patient will benefit from skilled therapeutic intervention in order to improve the following deficits and impairments:  Abnormal gait, Improper body mechanics, Pain, Decreased coordination, Decreased mobility, Increased muscle spasms, Postural dysfunction, Decreased activity tolerance, Decreased strength, Decreased range of motion, Difficulty walking, Impaired flexibility, Decreased balance  Visit Diagnosis: Chronic right-sided low back pain with right-sided sciatica - Plan: PT plan of care cert/re-cert  Abnormal posture - Plan: PT plan of care cert/re-cert  Muscle weakness (generalized) - Plan: PT plan of care cert/re-cert  Difficulty in walking, not elsewhere classified - Plan: PT plan of care cert/re-cert  Other symptoms and signs involving the  musculoskeletal system - Plan: PT plan of care cert/re-cert      G-Codes - [id number]/[id number]/251144    Functional Assessment Tool Used (Outpatient Only) Based on skilled clinical assessment of strength, posture, ROM, core, gait    Functional Limitation Mobility: Walking and moving around   Mobility: Walking and Moving Around Current Status ((E5277 At least 40 percent but less than 60 percent impaired, limited or restricted   Mobility: Walking and Moving Around Goal Status ((O2423 At least 20 percent but less than 40 percent impaired, limited or restricted       Problem List Patient Active Problem List   Diagnosis Date Noted  . Dysphagia 12/16/2015  . 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 Constipation 10/27/2015  . Diarrhea 08/02/2015  . Elevated alkaline phosphatase level 07/23/2015  . Heme positive stool 07/23/2015  . Hypoglycemia 07/01/2015  . Deep vein thrombosis (DVT) of both lower extremities ([place] 06/27/2015  . 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 Maculopapular rash, generalized 06/27/2015  . Alopecia 06/27/2015  . Hypothyroidism 06/27/2015  . Diverticulitis 10/19/2013  . Tobacco abuse 10/19/2013  .  Obesity 10/19/2013  . Diabetes ([place]) 10/19/2013  . Hypertension   . Anemia   . GERD (gastroesophageal reflux disease)   . COPD (chronic obstructive pulmonary disease) [facility])     Deniece Ree PT, DPT [facility] [address] [place], [place], [address] Phone: [phone]   Fax:  [phone]  Name: Stephanie [name] MRN: [id number] Date of Birth: 1950/10/04

## 2016-10-15 NOTE — Patient Instructions (Signed)
   SINGLE KNEE TO CHEST STRETCH - [place]  While Lying on your back, hold your knee and gently pull it up towards your chest. You may use a towel/sheet behind your knee to help you stretch like we did in clinic.   Hold for 10 seconds.   Repeat 5 times each leg, at least twice a day.     Lumbar Rotations   Lying on your back with your knees bent, slowly drop your legs to one side and hold the stretch. Come back to the middle and switch sides. You should feel the stretch in your back on the opposite side that your legs are leaning.  Hold for 10 seconds.  Repeat 5 times each side, at least twice a day.    BRIDGING  While lying on your back, tighten your lower abdominals, squeeze your buttocks and then raise your buttocks off the floor/bed as creating a "Bridge" with your body.   Repeat 5-10 times, at least twice a day.     Transverse Abdominus Activation  Lying on your back, pull your bellybutton into your spine.   Hold for a count of 3 then relax.  You can perform this exercise laying down, sitting, or standing- whichever position is most comfortable for you.  Repeat 20 times, at least 5 times per day.

## 2016-10-20 ENCOUNTER — Ambulatory Visit (HOSPITAL_COMMUNITY): Payer: Medicare Other | Attending: Internal Medicine

## 2016-10-20 DIAGNOSIS — G8929 Other chronic pain: Secondary | ICD-10-CM | POA: Diagnosis not present

## 2016-10-20 DIAGNOSIS — R29898 Other symptoms and signs involving the musculoskeletal system: Secondary | ICD-10-CM | POA: Diagnosis not present

## 2016-10-20 DIAGNOSIS — R262 Difficulty in walking, not elsewhere classified: Secondary | ICD-10-CM | POA: Diagnosis not present

## 2016-10-20 DIAGNOSIS — M5441 Lumbago with sciatica, right side: Secondary | ICD-10-CM | POA: Diagnosis not present

## 2016-10-20 DIAGNOSIS — M6281 Muscle weakness (generalized): Secondary | ICD-10-CM | POA: Diagnosis not present

## 2016-10-20 DIAGNOSIS — R293 Abnormal posture: Secondary | ICD-10-CM | POA: Diagnosis not present

## 2016-10-20 NOTE — Therapy (Signed)
[facility] [place], [place], [address] Phone: [phone]   Fax:  [phone]  Physical Therapy Treatment  Patient Details  Name: Stephanie [name] MRN: [id number] Date of Birth: 1950-03-28 Referring Provider: Darla Lesches Mayo   Encounter Date: 10/20/2016      PT End of Session - 10/20/16 1048    Visit Number 2   Number of Visits 13   Date for PT Re-Evaluation 11/05/16   Authorization Type UHC Medicare/Medicaid    Authorization Time Period 10/15/16 to 11/26/16   Authorization - Visit Number 2   Authorization - Number of Visits 10   PT Start Time 7341   PT Stop Time 1118   PT Time Calculation (min) 43 min   Activity Tolerance Patient tolerated treatment well;No increased pain   Behavior During Therapy WFL for tasks assessed/performed      Past Medical History:  Diagnosis Date  . Anemia   . Arthritis   . Asthma   . COPD (chronic obstructive pulmonary disease) ([place])   . Deep vein thrombosis (DVT) of both lower extremities ([place]) 06/27/2015  . Diabetes mellitus   . Fibromyalgia   . GERD (gastroesophageal reflux disease)   . H/O hiatal hernia   . Hypercholesteremia   . Hypertension   . Hyperthyroidism   . IBS (irritable bowel syndrome)   . Inner ear disease   . 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 PONV (postoperative nausea and vomiting)   . Tachycardia     Past Surgical History:  Procedure Laterality Date  . ABDOMINAL HYSTERECTOMY  partial  . CARPAL TUNNEL RELEASE Right 1991  . CATARACT EXTRACTION W/PHACO Right 05/08/2013   Procedure: CATARACT EXTRACTION PHACO AND INTRAOCULAR LENS PLACEMENT (IOC);  Surgeon: Tonny Branch, MD;  Location: [facility];  Service: Ophthalmology;  Laterality: Right;  CDE 10.31  . CATARACT EXTRACTION W/PHACO Left 08/17/2013   Procedure: CATARACT EXTRACTION PHACO AND INTRAOCULAR LENS PLACEMENT (IOC);  Surgeon: Tonny Branch, MD;  Location: [facility];  Service:  Ophthalmology;  Laterality: Left;  CDE:9.03  . CHOLECYSTECTOMY    . COLONOSCOPY WITH PROPOFOL N/A 01/06/2016   Dr. Gala Romney: diverticulosis   . DENTAL SURGERY    . ESOPHAGOGASTRODUODENOSCOPY (EGD) WITH PROPOFOL N/A 01/06/2016   Dr. Gala Romney: normal s/p empiric dilation   . MALONEY DILATION N/A 01/06/2016   Procedure: Venia Minks DILATION;  Surgeon: Daneil Dolin, MD;  Location: [facility];  Service: Endoscopy;  Laterality: N/A;  . WRIST GANGLION EXCISION Left     There were no vitals filed for this visit.      Subjective Assessment - 10/20/16 1039    Subjective Pt stated her back is feeling good today.  Reports she woke up with her Rt shoulder freezing and difficulty with movements.  Pt reports pain scale 4/10 Rt shoulder achey pain.     Patient Stated Goals be able to walk like she was before when she was working    Currently in Pain? Yes   Pain Score 4    Pain Location Shoulder   Pain Orientation Right  joint pain   Pain Descriptors / Indicators Aching   Pain Type Chronic pain   Pain Radiating Towards no reports of radicular symptoms today, did have on Rt LE posterior down to knee on Saturday 9/1 on 9/4   Pain Onset More than a month ago   Pain Frequency Constant   Aggravating Factors  bending, standing too long   Pain Relieving Factors  unsure   Effect of Pain on Daily Activities severe                         [facility] Adult PT Treatment/Exercise - 10/20/16 0001      Bed Mobility   Bed Mobility (P)  Sit to Sidelying Left   Sit to Sidelying Left (P)  5: Supervision     Lumbar Exercises: Stretches   Active Hamstring Stretch 2 reps;30 seconds   Active Hamstring Stretch Limitations supine wiht towel   Single Knee to Chest Stretch 5 reps;10 seconds   Single Knee to Chest Stretch Limitations reviewed HEP   Lower Trunk Rotation Limitations 10x 10" reviewed HEP     Lumbar Exercises: Supine   Ab Set 15 reps;3 seconds   Clam 10 reps;3 seconds   Clam Limitations RTB     Bent Knee Raise 10 reps   Bent Knee Raise Limitations with ab set   Bridge 10 reps   Bridge Limitations UE press down                 PT Education - 10/20/16 1049    Education provided Yes   Education Details Reviewed goals, assured compliance wiht HEP and copy of eval given to pt.  Reviewed benefits of weaning out of brace for core strengthening   Person(s) Educated Patient   Methods Explanation;Demonstration;Handout   Comprehension Verbalized understanding;Returned demonstration;Need further instruction;Verbal cues required          PT Short Term Goals - 10/15/16 1137      PT SHORT TERM GOAL #1   Title Patient to be able to perform all functional mobility, including bed mobility tasks, with correct mechanics and to prevent exacerbation of low back pain    Time 3   Period Weeks   Status New   Target Date 11/05/16     PT SHORT TERM GOAL #2   Title Patient to experience pain as being no more than 6/10 at worst and with symptoms only going down to knee on R LE in order to improve QOL and functional task tolerance    Time 3   Period Weeks   Status New     PT SHORT TERM GOAL #3   Title Patient to demonstrate at least a 50% improvement in flexibility in hamstrings, piriformis, spinal paraspinals,and hip flexor groups in order to improve mechanics and reduce pain with mobility    Time 3   Period Weeks   Status New     PT SHORT TERM GOAL #4   Title Patient to be compliant with correct performance of HEP, to be updated as appropriate    Time 1   Period Weeks   Status New   Target Date 10/22/16           PT Long Term Goals - 10/15/16 1141      PT LONG TERM GOAL #1   Title Patient to demonstrate an improvement of at least 1 MMT grade in all tested groups in order to improve tolerance to walking and functional task performance    Time 6   Period Weeks   Status New   Target Date 11/26/16     PT LONG TERM GOAL #2   Title Patient to experience pain as being  no more than 4/10 at worst with R LE pain no further than the R hip in order to improve QOL and functional task tolerance    Time 6  Period Weeks   Status New     PT LONG TERM GOAL #3   Title Patient to be participatory in regular walking program, at least 15 minutes in duration and 5 days per week, in order to promote regular activity and combat sednetary lifestyle    Time 6   Period Weeks   Status New     PT LONG TERM GOAL #4   Title Patient to be participatory in regular exericse program, at least 30 minutes in duration and 3 days per week, in order to maintain functional gains and prevent recurrence of condition    Time 6   Period Weeks   Status New               Plan - 10/20/16 1556    Clinical Impression Statement Reviewed goals, assured complaince and proper form/technqiue with HEP and copy of eval given to pt.  Educated pt with importance of proper bed mobility and encouraged not to wear back brace to improve core musculature to benefit strengthening.  Pt reports she has not worn since last PT session.  Therex focus on core and proximal strengthening wiht verbal and tactile cueing to improve stability.  No reports of pain thruogh session.     Rehab Potential Good   Clinical Impairments Affecting Rehab Potential (+) motivated to participate; (-) obesity, chronicity of pain, chronic use of un-necessary bracing    PT Frequency 2x / week   PT Duration 6 weeks   PT Treatment/Interventions ADLs/Self Care Home Management;Biofeedback;Cryotherapy;Electrical Stimulation;Iontophoresis 4mg /ml Dexamethasone;Moist Heat;Ultrasound;DME Instruction;Gait training;Stair training;Functional mobility training;Therapeutic activities;Therapeutic exercise;Balance training;Neuromuscular re-education;Patient/family education;Manual techniques;Energy conservation;Dry needling   PT Next Visit Plan progress core and proximal strengthening.  Added sidelying abduction wiht goof form next session.  AVOID  LUMBAR EXTENSION DUE TO ANTEROLISTHESIS. Flexion based program, core strength, LE strength. Continue to encourage discontinuation of brace.    PT Home Exercise Plan Eval: SKTC, LKTC, bridges without hyper-extension of spine, TA activation       Patient will benefit from skilled therapeutic intervention in order to improve the following deficits and impairments:  Abnormal gait, Improper body mechanics, Pain, Decreased coordination, Decreased mobility, Increased muscle spasms, Postural dysfunction, Decreased activity tolerance, Decreased strength, Decreased range of motion, Difficulty walking, Impaired flexibility, Decreased balance  Visit Diagnosis: Chronic right-sided low back pain with right-sided sciatica  Abnormal posture  Muscle weakness (generalized)  Difficulty in walking, not elsewhere classified  Other symptoms and signs involving the musculoskeletal system     Problem List Patient Active Problem List   Diagnosis Date Noted  . Dysphagia 12/16/2015  . 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 Constipation 10/27/2015  . Diarrhea 08/02/2015  . Elevated alkaline phosphatase level 07/23/2015  . Heme positive stool 07/23/2015  . Hypoglycemia 07/01/2015  . Deep vein thrombosis (DVT) of both lower extremities ([place]) 06/27/2015  . 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 Maculopapular rash, generalized 06/27/2015  . Alopecia 06/27/2015  . Hypothyroidism 06/27/2015  . Diverticulitis 10/19/2013  . Tobacco abuse 10/19/2013  . Obesity 10/19/2013  . Diabetes ([place]) 10/19/2013  . Hypertension   . Anemia   . GERD (gastroesophageal reflux disease)   . COPD (chronic obstructive pulmonary disease) [facility])    Ihor Austin, LPTA; CBIS [phone]  Aldona Lento 10/20/2016, 4:02 PM  [facility] [address] [place], [place], [address] Phone: [phone]   Fax:  [phone]  Name: Stephanie [name] MRN:  [id number] Date of Birth: [birth year]

## 2016-10-26 ENCOUNTER — Telehealth (HOSPITAL_COMMUNITY): Payer: Self-pay | Admitting: Internal Medicine

## 2016-10-26 NOTE — Telephone Encounter (Signed)
10/26/16  pt left a message to cancel, no reason given but she did ask for [place] to call her

## 2016-10-27 ENCOUNTER — Ambulatory Visit (HOSPITAL_COMMUNITY): Payer: Medicare Other

## 2016-10-28 ENCOUNTER — Telehealth (HOSPITAL_COMMUNITY): Payer: Self-pay | Admitting: Internal Medicine

## 2016-10-28 NOTE — Progress Notes (Signed)
AMA remains negative. We will continue to follow clinically unless she has a significant bump in alk phos that is greater than 2 times the upper limits of normal.

## 2016-10-28 NOTE — Telephone Encounter (Signed)
10/28/16  Left a message to offer some appts since she was on our wait list

## 2016-11-03 ENCOUNTER — Ambulatory Visit (HOSPITAL_COMMUNITY): Payer: Medicare Other

## 2016-11-03 DIAGNOSIS — R29898 Other symptoms and signs involving the musculoskeletal system: Secondary | ICD-10-CM

## 2016-11-03 DIAGNOSIS — M6281 Muscle weakness (generalized): Secondary | ICD-10-CM | POA: Diagnosis not present

## 2016-11-03 DIAGNOSIS — G8929 Other chronic pain: Secondary | ICD-10-CM

## 2016-11-03 DIAGNOSIS — R262 Difficulty in walking, not elsewhere classified: Secondary | ICD-10-CM

## 2016-11-03 DIAGNOSIS — M5441 Lumbago with sciatica, right side: Secondary | ICD-10-CM | POA: Diagnosis not present

## 2016-11-03 DIAGNOSIS — R293 Abnormal posture: Secondary | ICD-10-CM

## 2016-11-03 NOTE — Therapy (Signed)
[facility] [place], [place], [address] Phone: [phone]   Fax:  [phone]  Physical Therapy Treatment  Patient Details  Name: Stephanie [name] MRN: [id number] Date of Birth: 1950/07/18 Referring Provider: Darla Lesches Mayo   Encounter Date: 11/03/2016      PT End of Session - 11/03/16 1044    Visit Number 3   Number of Visits 13   Date for PT Re-Evaluation 11/05/16   Authorization Type UHC Medicare/Medicaid    Authorization Time Period 10/15/16 to 11/26/16   Authorization - Visit Number 3   Authorization - Number of Visits 10   PT Start Time 6004   PT Stop Time 1114   PT Time Calculation (min) 38 min   Activity Tolerance Patient tolerated treatment well;No increased pain   Behavior During Therapy WFL for tasks assessed/performed      Past Medical History:  Diagnosis Date  . Anemia   . Arthritis   . Asthma   . COPD (chronic obstructive pulmonary disease) ([place])   . Deep vein thrombosis (DVT) of both lower extremities ([place]) 06/27/2015  . Diabetes mellitus   . Fibromyalgia   . GERD (gastroesophageal reflux disease)   . H/O hiatal hernia   . Hypercholesteremia   . Hypertension   . Hyperthyroidism   . IBS (irritable bowel syndrome)   . Inner ear disease   . 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 PONV (postoperative nausea and vomiting)   . Tachycardia     Past Surgical History:  Procedure Laterality Date  . ABDOMINAL HYSTERECTOMY  partial  . CARPAL TUNNEL RELEASE Right 1991  . CATARACT EXTRACTION W/PHACO Right 05/08/2013   Procedure: CATARACT EXTRACTION PHACO AND INTRAOCULAR LENS PLACEMENT (IOC);  Surgeon: Tonny Branch, MD;  Location: [facility];  Service: Ophthalmology;  Laterality: Right;  CDE 10.31  . CATARACT EXTRACTION W/PHACO Left 08/17/2013   Procedure: CATARACT EXTRACTION PHACO AND INTRAOCULAR LENS PLACEMENT (IOC);  Surgeon: Tonny Branch, MD;  Location: [facility];  Service:  Ophthalmology;  Laterality: Left;  CDE:9.03  . CHOLECYSTECTOMY    . COLONOSCOPY WITH PROPOFOL N/A 01/06/2016   Dr. Gala Romney: diverticulosis   . DENTAL SURGERY    . ESOPHAGOGASTRODUODENOSCOPY (EGD) WITH PROPOFOL N/A 01/06/2016   Dr. Gala Romney: normal s/p empiric dilation   . MALONEY DILATION N/A 01/06/2016   Procedure: Venia Minks DILATION;  Surgeon: Daneil Dolin, MD;  [facility];  Service: Endoscopy;  Laterality: N/A;  . WRIST GANGLION EXCISION Left     There were no vitals filed for this visit.      Subjective Assessment - 11/03/16 1031    Subjective Pt stated her back is feeling good today, reports she has only had 2 episodes this week with back pain, usually every day of the week.  Current pain scale 3/10 LBP.     Patient Stated Goals be able to walk like she was before when she was working    Currently in Pain? Yes   Pain Score 3    Pain Location Back   Pain Orientation Lower   Pain Descriptors / Indicators Sore;Aching   Pain Type Chronic pain   Pain Radiating Towards no reports of radicular symptoms   Pain Onset More than a month ago   Pain Frequency Constant   Aggravating Factors  bending, standing too long   Pain Relieving Factors  unsure   Effect of Pain on Daily Activities severe  [facility] Adult PT Treatment/Exercise - 11/03/16 0001      Lumbar Exercises: Stretches   Active Hamstring Stretch 2 reps;30 seconds   Active Hamstring Stretch Limitations supine wiht towel   Lower Trunk Rotation Limitations 10x 10" reviewed HEP     Lumbar Exercises: Seated   Sit to Stand 10 reps   Sit to Stand Limitations no HHA, eccentric control     Lumbar Exercises: Supine   Clam 10 reps;3 seconds   Clam Limitations RTB    Bent Knee Raise 10 reps   Bent Knee Raise Limitations with ab set and RTB resistance   Other Supine Lumbar Exercises LTR 10x 10"     Lumbar Exercises: Sidelying   Hip Abduction 10 reps   Hip Abduction Limitations  cueing for form                  PT Short Term Goals - 10/15/16 1137      PT SHORT TERM GOAL #1   Title Patient to be able to perform all functional mobility, including bed mobility tasks, with correct mechanics and to prevent exacerbation of low back pain    Time 3   Period Weeks   Status New   Target Date 11/05/16     PT SHORT TERM GOAL #2   Title Patient to experience pain as being no more than 6/10 at worst and with symptoms only going down to knee on R LE in order to improve QOL and functional task tolerance    Time 3   Period Weeks   Status New     PT SHORT TERM GOAL #3   Title Patient to demonstrate at least a 50% improvement in flexibility in hamstrings, piriformis, spinal paraspinals,and hip flexor groups in order to improve mechanics and reduce pain with mobility    Time 3   Period Weeks   Status New     PT SHORT TERM GOAL #4   Title Patient to be compliant with correct performance of HEP, to be updated as appropriate    Time 1   Period Weeks   Status New   Target Date 10/22/16           PT Long Term Goals - 10/15/16 1141      PT LONG TERM GOAL #1   Title Patient to demonstrate an improvement of at least 1 MMT grade in all tested groups in order to improve tolerance to walking and functional task performance    Time 6   Period Weeks   Status New   Target Date 11/26/16     PT LONG TERM GOAL #2   Title Patient to experience pain as being no more than 4/10 at worst with R LE pain no further than the R hip in order to improve QOL and functional task tolerance    Time 6   Period Weeks   Status New     PT LONG TERM GOAL #3   Title Patient to be participatory in regular walking program, at least 15 minutes in duration and 5 days per week, in order to promote regular activity and combat sednetary lifestyle    Time 6   Period Weeks   Status New     PT LONG TERM GOAL #4   Title Patient to be participatory in regular exericse program, at least 30  minutes in duration and 3 days per week, in order to maintain functional gains and prevent recurrence of condition    Time 6  Period Weeks   Status New               Plan - 11/03/16 1101    Clinical Impression Statement Pt improving bed mobility and reports she has not worn back brace since initial eval.  Pt reports her back is feeling good and has had only 2 episodes of pain this week, was experiencing daily.  Session focus on improving core strengthening for stability with additional proximal strengthening exericses added today.  Therapist facilitaiotn for proper form and correct musculature activation with sidelying abduction and cueing for eccentric control with sit to stands.  No reports of increased pain through session.     Rehab Potential Good   Clinical Impairments Affecting Rehab Potential (+) motivated to participate; (-) obesity, chronicity of pain, chronic use of un-necessary bracing    PT Frequency 2x / week   PT Duration 6 weeks   PT Treatment/Interventions ADLs/Self Care Home Management;Biofeedback;Cryotherapy;Electrical Stimulation;Iontophoresis 66m/ml Dexamethasone;Moist Heat;Ultrasound;DME Instruction;Gait training;Stair training;Functional mobility training;Therapeutic activities;Therapeutic exercise;Balance training;Neuromuscular re-education;Patient/family education;Manual techniques;Energy conservation;Dry needling   PT Next Visit Plan progress core and proximal strengthening.  AVOID LUMBAR EXTENSION DUE TO ANTEROLISTHESIS. Flexion based program, core strength, LE strength.   Next session begin [facility] activities including heel/toe raises, squats and lunges.  Progress to postural strenghtening.     PT Home Exercise Plan Eval: SKTC, LKTC, bridges without hyper-extension of spine, TA activation       Patient will benefit from skilled therapeutic intervention in order to improve the following deficits and impairments:  Abnormal gait, Improper body mechanics, Pain, Decreased  coordination, Decreased mobility, Increased muscle spasms, Postural dysfunction, Decreased activity tolerance, Decreased strength, Decreased range of motion, Difficulty walking, Impaired flexibility, Decreased balance  Visit Diagnosis: Chronic right-sided low back pain with right-sided sciatica  Abnormal posture  Muscle weakness (generalized)  Difficulty in walking, not elsewhere classified  Other symptoms and signs involving the musculoskeletal system     Problem List Patient Active Problem List   Diagnosis Date Noted  . Dysphagia 12/16/2015  . 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 Constipation 10/27/2015  . Diarrhea 08/02/2015  . Elevated alkaline phosphatase level 07/23/2015  . Heme positive stool 07/23/2015  . Hypoglycemia 07/01/2015  . Deep vein [place] (DVT) of both lower [place] ([place] 06/27/2015  . 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 Maculopapular rash, generalized 06/27/2015  . Alopecia 06/27/2015  . Hypothyroidism 06/27/2015  . Diverticulitis 10/19/2013  . Tobacco abuse 10/19/2013  . Obesity 10/19/2013  . Diabetes ([place] 10/19/2013  . Hypertension   . Anemia   . GERD (gastroesophageal reflux disease)   . COPD (chronic obstructive pulmonary disease) ([facility]    CIhor Austin LPTA; CBIS [phone] CAldona Lento9/18/2018, 11:15 AM  [place] [address]Phone: [phone]  Fax:  [phone] Name: AELFIE COSTANZAMRN: [id number] of Birth: [birth year]

## 2016-11-05 ENCOUNTER — Ambulatory Visit (HOSPITAL_COMMUNITY): Payer: Medicare Other | Admitting: Physical Therapy

## 2016-11-05 ENCOUNTER — Encounter (HOSPITAL_COMMUNITY): Payer: Self-pay | Admitting: Physical Therapy

## 2016-11-05 DIAGNOSIS — R29898 Other symptoms and signs involving the musculoskeletal system: Secondary | ICD-10-CM | POA: Diagnosis not present

## 2016-11-05 DIAGNOSIS — R262 Difficulty in walking, not elsewhere classified: Secondary | ICD-10-CM | POA: Diagnosis not present

## 2016-11-05 DIAGNOSIS — R293 Abnormal posture: Secondary | ICD-10-CM

## 2016-11-05 DIAGNOSIS — M5441 Lumbago with sciatica, right side: Principal | ICD-10-CM

## 2016-11-05 DIAGNOSIS — G8929 Other chronic pain: Secondary | ICD-10-CM

## 2016-11-05 DIAGNOSIS — M6281 Muscle weakness (generalized): Secondary | ICD-10-CM

## 2016-11-05 NOTE — Therapy (Signed)
[place], [place], [address] Phone: [phone]   Fax:  [phone]  Physical Therapy Treatment (Re-Assessment)  Patient Details  Name: Stephanie [name] MRN: [id number] Date of Birth: March 02, 1950 Referring Provider: Darla Lesches Mayo   Encounter Date: 11/05/2016      PT End of Session - 11/05/16 1817    Visit Number 4   Number of Visits 13   Date for PT Re-Evaluation 11/26/16   Authorization Type [employer] Medicare/Medicaid (G-codes done 4th session)   Authorization Time Period 10/15/16 to 11/26/16   Authorization - Visit Number 4   Authorization - Number of Visits 14   PT Start Time 1033   PT Stop Time 1108  patient request to leave a few minutes early    PT Time Calculation (min) 35 min   Activity Tolerance Patient tolerated treatment well   Behavior During Therapy [facility] for tasks assessed/performed      Past Medical History:  Diagnosis Date  . Anemia   . Arthritis   . Asthma   . COPD (chronic obstructive pulmonary disease) ([place])   . Deep vein thrombosis (DVT) of both lower extremities ([place]) 06/27/2015  . Diabetes mellitus   . Fibromyalgia   . GERD (gastroesophageal reflux disease)   . H/O hiatal hernia   . Hypercholesteremia   . Hypertension   . Hyperthyroidism   . IBS (irritable bowel syndrome)   . Inner ear disease   . 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 PONV (postoperative nausea and vomiting)   . Tachycardia     Past Surgical History:  Procedure Laterality Date  . ABDOMINAL HYSTERECTOMY  partial  . CARPAL TUNNEL RELEASE Right 1991  . CATARACT EXTRACTION W/PHACO Right 05/08/2013   Procedure: CATARACT EXTRACTION PHACO AND INTRAOCULAR LENS PLACEMENT (IOC);  Surgeon: Tonny Branch, MD;  Location: [facility];  Service: Ophthalmology;  Laterality: Right;  CDE 10.31  . CATARACT EXTRACTION W/PHACO Left 08/17/2013   Procedure: CATARACT EXTRACTION PHACO AND INTRAOCULAR LENS PLACEMENT  (IOC);  Surgeon: Tonny Branch, MD;  Location: [facility];  Service: Ophthalmology;  Laterality: Left;  CDE:9.03  . CHOLECYSTECTOMY    . COLONOSCOPY WITH PROPOFOL N/A 01/06/2016   Dr. Gala Romney: diverticulosis   . DENTAL SURGERY    . ESOPHAGOGASTRODUODENOSCOPY (EGD) WITH PROPOFOL N/A 01/06/2016   Dr. Gala Romney: normal s/p empiric dilation   . MALONEY DILATION N/A 01/06/2016   Procedure: Venia Minks DILATION;  Surgeon: Daneil Dolin, MD;  Location: [facility];  Service: Endoscopy;  Laterality: N/A;  . WRIST GANGLION EXCISION Left     There were no vitals filed for this visit.      Subjective Assessment - 11/05/16 1036    Subjective Patient reports she is feeling better by about 50%; she is not wearing the barce anymore. It is easier for her to move around, but still difficult for her to stand.    How long can you sit comfortably? 9/20- fairly unlimited    How long can you stand comfortably? 9/20- 15-20 minutes    How long can you walk comfortably? 9/20- able to walk around [employer] without an issue    Diagnostic tests anterolisthesis L4-L5/L5-S1, spinal stenosis, endplate spurring   Patient Stated Goals be able to walk like she was before when she was working    Currently in Pain? Yes   Pain Score 2    Pain Location Back   Pain Orientation Lower   Pain Descriptors / Indicators Spasm  [facility] PT Assessment - 11/05/16 0001      AROM   Lumbar Flexion approx 20% limited    Lumbar Extension WFL    Lumbar - Right Side Bend WFL    Lumbar - Left Side Bend [facility]      Strength   Right Hip Flexion 4+/5   Right Hip ABduction 4+/5   Left Hip Flexion 4+/5   Left Hip ABduction 4+/5   Right Knee Flexion 4+/5   Right Knee Extension 5/5   Left Knee Flexion 4+/5   Left Knee Extension 4+/5   Right Ankle Dorsiflexion 5/5   Left Ankle Dorsiflexion 5/5     Flexibility   Hamstrings mild limitation L, R WFL    Piriformis moderate limitation L, R mild limitation       NUstep level 6 at 50SPM  LEs only for 5 minutes, patient very fatigued (not included in billing)                       PT Education - 11/05/16 1816    Education provided Yes   Education Details progress with skilled PT services, encouraged patient to continue avoiding wearing back brace to assist in improving core strength, HEP update, POC moving forward    Person(s) Educated Patient   Methods Explanation;Handout   Comprehension Verbalized understanding;Returned demonstration;Need further instruction          PT Short Term Goals - 11/05/16 1044      PT SHORT TERM GOAL #1   Title Patient to be able to perform all functional mobility, including bed mobility tasks, with correct mechanics and to prevent exacerbation of low back pain    Baseline 9/20- demonstrates during session, reports she is doing this at home    Time 3   Period Weeks   Status Achieved     PT SHORT TERM GOAL #2   Title Patient to experience pain as being no more than 6/10 at worst and with symptoms only going down to knee on R LE in order to improve QOL and functional task tolerance    Baseline 9/20- 5/10 at worst recently; just barely to thigh in terms of peripheralization    Time 3   Period Weeks   Status Achieved     PT SHORT TERM GOAL #3   Title Patient to demonstrate at least a 50% improvement in flexibility in hamstrings, piriformis, spinal paraspinals,and hip flexor groups in order to improve mechanics and reduce pain with mobility    Baseline 9/20- mildly impaired, has improved well    Time 3   Period Weeks   Status Partially Met     PT SHORT TERM GOAL #4   Title Patient to be compliant with correct performance of HEP, to be updated as appropriate    Baseline 9/20- compliant    Time 1   Period Weeks   Status Achieved           PT Long Term Goals - 11/05/16 1047      PT LONG TERM GOAL #1   Title Patient to demonstrate an improvement of at least 1 MMT grade in all tested groups in order to improve  tolerance to walking and functional task performance    Baseline 9/20- improved    Time 6   Period Weeks   Status Achieved     PT LONG TERM GOAL #2   Title Patient to experience pain as being no more than 4/10 at worst with  R LE pain no further than the R hip in order to improve QOL and functional task tolerance    Baseline 30-Nov-2022- improving    Time 6   Period Weeks   Status On-going     PT LONG TERM GOAL #3   Title Patient to be participatory in regular walking program, at least 15 minutes in duration and 5 days per week, in order to promote regular activity and combat sednetary lifestyle    Baseline Nov 30, 2022- at home, she is walking laps at home   Time 6   Period Weeks   Status On-going     PT LONG TERM GOAL #4   Title Patient to be participatory in regular exericse program, at least 30 minutes in duration and 3 days per week, in order to maintain functional gains and prevent recurrence of condition    Baseline 2022-11-30- ongoing    Time 6   Period Weeks   Status On-going               Plan - 11/29/2016 1820    Clinical Impression Statement Re-assessment performed today. Patient, despite having attended limited number of skilled treatment sessions since her initial evaluation, has made good progress towards her goals at this time. She does continue to experience lumbar pain and demonstrates ongoing functional impairments including poor posture, impaired core strength, impaired muscle flexibility, and reduced functional activity tolerance, and will benefit from continuation of skilled PT services moving forward.    Clinical Decision Making Low   Rehab Potential Good   Clinical Impairments Affecting Rehab Potential (+) motivated to participate; (-) obesity, chronicity of pain, chronic use of un-necessary bracing    PT Frequency 2x / week   PT Duration 3 weeks   PT Treatment/Interventions ADLs/Self Care Home Management;Biofeedback;Cryotherapy;Electrical Stimulation;Iontophoresis 81m/ml  Dexamethasone;Moist Heat;Ultrasound;DME Instruction;Gait training;Stair training;Functional mobility training;Therapeutic activities;Therapeutic exercise;Balance training;Neuromuscular re-education;Patient/family education;Manual techniques;Energy conservation;Dry needling   PT Next Visit Plan progress core and proximal strengthening.  AVOID LUMBAR EXTENSION DUE TO ANTEROLISTHESIS. Flexion based program, core strength, LE strength.   Next session begin CKC activities including heel/toe raises, squats and lunges.  Progress to postural strenghtening.     PT Home Exercise Plan Eval: SKTC, LKTC, bridges without hyper-extension of spine, TA activation 92024/10/14 hip hikes, supine brace marches, supine dead bugs, sidelying hip ABD    Consulted and Agree with Plan of Care Patient      Patient will benefit from skilled therapeutic intervention in order to improve the following deficits and impairments:  Abnormal gait, Improper body mechanics, Pain, Decreased coordination, Decreased mobility, Increased muscle spasms, Postural dysfunction, Decreased activity tolerance, Decreased strength, Decreased range of motion, Difficulty walking, Impaired flexibility, Decreased balance  Visit Diagnosis: Chronic right-sided low back pain with right-sided sciatica  Abnormal posture  Muscle weakness (generalized)  Difficulty in walking, not elsewhere classified       G-Codes - 010/14/181821    Functional Assessment Tool Used (Outpatient Only) Based on skilled clinical assessment of strength, posture, ROM, core, gait    Functional Limitation Mobility: Walking and moving around   Mobility: Walking and Moving Around Current Status (([id number] At least 20 percent but less than 40 percent impaired, limited or restricted   Mobility: Walking and Moving Around Goal Status ([phone] At least 1 percent but less than 20 percent impaired, limited or restricted      Problem List Patient Active Problem List   Diagnosis Date Noted  .  Dysphagia 12/16/2015  . MGUS (monoclonal gammopathy of unknown  significance) 12/13/2015  . Constipation 10/27/2015  . Diarrhea 08/02/2015  . Elevated alkaline phosphatase level 07/23/2015  . Heme positive stool 07/23/2015  . Hypoglycemia 07/01/2015  . Deep vein thrombosis (DVT) of both lower extremities ([place]) 06/27/2015  . 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 Maculopapular rash, generalized 06/27/2015  . Alopecia 06/27/2015  . Hypothyroidism 06/27/2015  . Diverticulitis 10/19/2013  . Tobacco abuse 10/19/2013  . Obesity 10/19/2013  . Diabetes ([place]) 10/19/2013  . Hypertension   . Anemia   . GERD (gastroesophageal reflux disease)   . COPD (chronic obstructive pulmonary disease) [facility]     Deniece Ree PT, DPT Center [address] [place], [place], [address] Phone: [phone]   Fax:  [phone]  Name: Stephanie [name] MRN: [id number] Date of Birth: [birth year]

## 2016-11-05 NOTE — Patient Instructions (Signed)
   HIP ABDUCTION - SIDELYING  While lying on your side, slowly raise up your top leg to the side. Keep your knee straight and maintain your toes pointed forward the entire time. Keep your leg in-line with your body.  The bottom leg can be bent to stabilize your body. Be very careful to keep your hips stacked and not to roll them backwards during this exercise.   Repeat 10-15 times each leg, twice a day.    BRACE SUPINE MARCHING  While lying on your back with your knees bent,  slowly raise up one foot a few inches and then set it back down.  Next, perform on your other leg.  Use your stomach muscles to keep your spine from moving- squeeze your core TIGHT.  Repeat 10-15 times each leg, twice a day.     DEAD BUG  While lying on your back with your knees and hips bent to 90 degrees, use your stomach muscles and maintain pelvic neutral position. Do not allow your spine to move- hold your core TIGHT.    Hold pelvic neutral and then slowly straighten out a leg without touching the floor.  At the same time raise an opposite arm over head. Do not allow your spine to arch during this movement.  Retrun to starting position and then repeat on the opposite side.   Repeat 10 times each side, twice a day.     HIP HIKES  While standing up on a step, lower one leg downward towards the floor by tilting your pelvis to the side.   Then return the pelvis/leg back to a leveled position.  Repeat 5 times each side, twice a day.

## 2016-11-09 ENCOUNTER — Other Ambulatory Visit (HOSPITAL_COMMUNITY)
Admission: RE | Admit: 2016-11-09 | Discharge: 2016-11-09 | Disposition: A | Discharge status: Home / Self Care | Payer: Medicare Other | Source: Ambulatory Visit | Attending: Family Medicine | Admitting: Family Medicine

## 2016-11-09 DIAGNOSIS — E1140 Type 2 diabetes mellitus with diabetic neuropathy, unspecified: Secondary | ICD-10-CM | POA: Diagnosis not present

## 2016-11-09 DIAGNOSIS — I10 Essential (primary) hypertension: Secondary | ICD-10-CM | POA: Insufficient documentation

## 2016-11-09 DIAGNOSIS — G894 Chronic pain syndrome: Secondary | ICD-10-CM | POA: Diagnosis not present

## 2016-11-09 LAB — COMPREHENSIVE METABOLIC PANEL
ALK PHOS: 206 U/L — AB (ref 38–126)
ALT: 40 U/L (ref 14–54)
AST: 35 U/L (ref 15–41)
Albumin: 3.5 g/dL (ref 3.5–5.0)
Anion gap: 10 (ref 5–15)
BUN: 27 mg/dL — ABNORMAL HIGH (ref 6–20)
CALCIUM: 8.8 mg/dL — AB (ref 8.9–10.3)
CO2: 23 mmol/L (ref 22–32)
CREATININE: 1.57 mg/dL — AB (ref 0.44–1.00)
Chloride: 103 mmol/L (ref 101–111)
GFR, EST AFRICAN AMERICAN: 39 mL/min — AB (ref >60)
GFR, EST NON AFRICAN AMERICAN: 33 mL/min — AB (ref >60)
Glucose, Bld: 151 mg/dL — ABNORMAL HIGH (ref 65–99)
Potassium: 3.7 mmol/L (ref 3.5–5.1)
Sodium: 136 mmol/L (ref 135–145)
TOTAL PROTEIN: 7.6 g/dL (ref 6.5–8.1)
Total Bilirubin: 0.4 mg/dL (ref 0.3–1.2)

## 2016-11-10 ENCOUNTER — Telehealth (HOSPITAL_COMMUNITY): Payer: Self-pay | Admitting: Internal Medicine

## 2016-11-10 ENCOUNTER — Ambulatory Visit (HOSPITAL_COMMUNITY): Payer: Medicare Other

## 2016-11-10 NOTE — Telephone Encounter (Signed)
11/10/16  cx - she said that she had an emergency call and had to leave out now

## 2016-11-13 ENCOUNTER — Encounter (HOSPITAL_COMMUNITY): Payer: Medicare Other

## 2016-11-16 DIAGNOSIS — M48061 Spinal stenosis, lumbar region without neurogenic claudication: Secondary | ICD-10-CM | POA: Diagnosis not present

## 2016-11-16 DIAGNOSIS — M4727 Other spondylosis with radiculopathy, lumbosacral region: Secondary | ICD-10-CM | POA: Diagnosis not present

## 2016-11-17 ENCOUNTER — Telehealth (HOSPITAL_COMMUNITY): Payer: Self-pay | Admitting: Internal Medicine

## 2016-11-17 ENCOUNTER — Ambulatory Visit (HOSPITAL_COMMUNITY): Payer: Medicare Other | Admitting: Physical Therapy

## 2016-11-17 NOTE — Telephone Encounter (Signed)
11/17/16  caller cx said she was having an asthma attack

## 2016-11-18 ENCOUNTER — Telehealth (HOSPITAL_COMMUNITY): Payer: Self-pay | Admitting: Internal Medicine

## 2016-11-18 NOTE — Telephone Encounter (Signed)
11/18/16  pt called and cx said she didn't think she would be able to do therapy

## 2016-11-19 ENCOUNTER — Encounter (HOSPITAL_COMMUNITY): Payer: Medicare Other | Admitting: Physical Therapy

## 2016-11-23 ENCOUNTER — Telehealth (HOSPITAL_COMMUNITY): Payer: Self-pay | Admitting: Physical Therapy

## 2016-11-23 DIAGNOSIS — I10 Essential (primary) hypertension: Secondary | ICD-10-CM | POA: Diagnosis not present

## 2016-11-23 NOTE — Telephone Encounter (Signed)
pt called to cx Tuesday apptment and will plan to come in on Thursday if the swelling goes down in her foot.

## 2016-11-24 ENCOUNTER — Ambulatory Visit (HOSPITAL_COMMUNITY): Payer: Self-pay | Admitting: Physical Therapy

## 2016-11-26 ENCOUNTER — Ambulatory Visit (HOSPITAL_COMMUNITY): Payer: Medicare Other | Admitting: Physical Therapy

## 2016-12-04 DIAGNOSIS — M545 Low back pain: Secondary | ICD-10-CM | POA: Diagnosis not present

## 2016-12-04 DIAGNOSIS — M5137 Other intervertebral disc degeneration, lumbosacral region: Secondary | ICD-10-CM | POA: Diagnosis not present

## 2016-12-08 ENCOUNTER — Telehealth (HOSPITAL_COMMUNITY): Payer: Self-pay | Admitting: Physical Therapy

## 2016-12-08 ENCOUNTER — Ambulatory Visit (HOSPITAL_COMMUNITY): Payer: Medicare Other | Admitting: Physical Therapy

## 2016-12-08 NOTE — Telephone Encounter (Signed)
Patient's daughter Mariann Laster agreed to come in on this date10/31/18 due to cx on 12/08/16.NF

## 2016-12-16 ENCOUNTER — Telehealth (HOSPITAL_COMMUNITY): Payer: Self-pay | Admitting: Internal Medicine

## 2016-12-16 ENCOUNTER — Ambulatory Visit (HOSPITAL_COMMUNITY): Payer: Medicare Other | Admitting: Physical Therapy

## 2016-12-16 NOTE — Telephone Encounter (Signed)
12/16/16  cx she said that her daughter couldn't bring her.  When I asked if she wanted to reschedule she said she would have to check with her daughter and call back

## 2016-12-21 ENCOUNTER — Ambulatory Visit: Payer: Medicare Other | Admitting: Gastroenterology

## 2016-12-30 ENCOUNTER — Other Ambulatory Visit (HOSPITAL_COMMUNITY)
Admission: RE | Admit: 2016-12-30 | Discharge: 2016-12-30 | Disposition: A | Discharge status: Home / Self Care | Payer: Medicare Other | Source: Ambulatory Visit | Attending: Family Medicine | Admitting: Family Medicine

## 2016-12-30 DIAGNOSIS — I10 Essential (primary) hypertension: Secondary | ICD-10-CM | POA: Insufficient documentation

## 2016-12-30 DIAGNOSIS — R945 Abnormal results of liver function studies: Secondary | ICD-10-CM | POA: Insufficient documentation

## 2016-12-30 DIAGNOSIS — E785 Hyperlipidemia, unspecified: Secondary | ICD-10-CM | POA: Insufficient documentation

## 2016-12-30 DIAGNOSIS — E039 Hypothyroidism, unspecified: Secondary | ICD-10-CM | POA: Insufficient documentation

## 2016-12-30 DIAGNOSIS — E1121 Type 2 diabetes mellitus with diabetic nephropathy: Secondary | ICD-10-CM | POA: Insufficient documentation

## 2016-12-30 LAB — CBC WITH DIFFERENTIAL/PLATELET
Basophils Absolute: 0 10*3/uL (ref 0.0–0.1)
Basophils Relative: 0 %
EOS ABS: 0.4 10*3/uL (ref 0.0–0.7)
EOS PCT: 3 %
HCT: 36.5 % (ref 36.0–46.0)
HEMOGLOBIN: 11.1 g/dL — AB (ref 12.0–15.0)
LYMPHS ABS: 5.4 10*3/uL — AB (ref 0.7–4.0)
Lymphocytes Relative: 34 %
MCH: 25.1 pg — AB (ref 26.0–34.0)
MCHC: 30.4 g/dL (ref 30.0–36.0)
MCV: 82.4 fL (ref 78.0–100.0)
MONOS PCT: 6 %
Monocytes Absolute: 0.9 10*3/uL (ref 0.1–1.0)
NEUTROS PCT: 57 %
Neutro Abs: 9.4 10*3/uL — ABNORMAL HIGH (ref 1.7–7.7)
Platelets: 294 10*3/uL (ref 150–400)
RBC: 4.43 MIL/uL (ref 3.87–5.11)
RDW: 14.1 % (ref 11.5–15.5)
WBC: 16.2 10*3/uL — ABNORMAL HIGH (ref 4.0–10.5)

## 2016-12-30 LAB — COMPREHENSIVE METABOLIC PANEL
ALT: 56 U/L — ABNORMAL HIGH (ref 14–54)
ANION GAP: 8 (ref 5–15)
AST: 26 U/L (ref 15–41)
Albumin: 3.3 g/dL — ABNORMAL LOW (ref 3.5–5.0)
Alkaline Phosphatase: 299 U/L — ABNORMAL HIGH (ref 38–126)
BILIRUBIN TOTAL: 0.5 mg/dL (ref 0.3–1.2)
BUN: 29 mg/dL — ABNORMAL HIGH (ref 6–20)
CO2: 29 mmol/L (ref 22–32)
Calcium: 8.9 mg/dL (ref 8.9–10.3)
Chloride: 97 mmol/L — ABNORMAL LOW (ref 101–111)
Creatinine, Ser: 1.35 mg/dL — ABNORMAL HIGH (ref 0.44–1.00)
GFR, EST AFRICAN AMERICAN: 46 mL/min — AB (ref >60)
GFR, EST NON AFRICAN AMERICAN: 40 mL/min — AB (ref >60)
Glucose, Bld: 145 mg/dL — ABNORMAL HIGH (ref 65–99)
POTASSIUM: 3.7 mmol/L (ref 3.5–5.1)
Sodium: 134 mmol/L — ABNORMAL LOW (ref 135–145)
TOTAL PROTEIN: 8.2 g/dL — AB (ref 6.5–8.1)

## 2016-12-30 LAB — LIPID PANEL
CHOL/HDL RATIO: 4.4 ratio
CHOLESTEROL: 222 mg/dL — AB (ref 0–200)
HDL: 51 mg/dL (ref >40)
LDL Cholesterol: 137 mg/dL — ABNORMAL HIGH (ref 0–99)
Triglycerides: 171 mg/dL — ABNORMAL HIGH (ref <150)
VLDL: 34 mg/dL (ref 0–40)

## 2016-12-30 LAB — TSH: TSH: 3.336 u[IU]/mL (ref 0.350–4.500)

## 2016-12-30 LAB — GAMMA GT: GGT: 380 U/L — ABNORMAL HIGH (ref 7–50)

## 2016-12-30 LAB — HEMOGLOBIN A1C
Hgb A1c MFr Bld: 8 % — ABNORMAL HIGH (ref 4.8–5.6)
Mean Plasma Glucose: 182.9 mg/dL

## 2017-01-04 DIAGNOSIS — Z23 Encounter for immunization: Secondary | ICD-10-CM | POA: Diagnosis not present

## 2017-01-04 DIAGNOSIS — M5441 Lumbago with sciatica, right side: Secondary | ICD-10-CM | POA: Diagnosis not present

## 2017-01-04 DIAGNOSIS — Z79899 Other long term (current) drug therapy: Secondary | ICD-10-CM | POA: Diagnosis not present

## 2017-01-04 DIAGNOSIS — G894 Chronic pain syndrome: Secondary | ICD-10-CM | POA: Diagnosis not present

## 2017-01-04 DIAGNOSIS — I10 Essential (primary) hypertension: Secondary | ICD-10-CM | POA: Diagnosis not present

## 2017-01-04 DIAGNOSIS — E1140 Type 2 diabetes mellitus with diabetic neuropathy, unspecified: Secondary | ICD-10-CM | POA: Diagnosis not present

## 2017-01-18 ENCOUNTER — Encounter (HOSPITAL_COMMUNITY): Payer: Medicare Other | Attending: Oncology

## 2017-01-18 DIAGNOSIS — D472 Monoclonal gammopathy: Secondary | ICD-10-CM | POA: Insufficient documentation

## 2017-01-18 LAB — COMPREHENSIVE METABOLIC PANEL
ALBUMIN: 3 g/dL — AB (ref 3.5–5.0)
ALK PHOS: 261 U/L — AB (ref 38–126)
ALT: 31 U/L (ref 14–54)
ANION GAP: 7 (ref 5–15)
AST: 31 U/L (ref 15–41)
BUN: 15 mg/dL (ref 6–20)
CALCIUM: 9 mg/dL (ref 8.9–10.3)
CO2: 25 mmol/L (ref 22–32)
CREATININE: 1.06 mg/dL — AB (ref 0.44–1.00)
Chloride: 105 mmol/L (ref 101–111)
GFR calc Af Amer: >60 mL/min (ref >60)
GFR calc non Af Amer: 53 mL/min — ABNORMAL LOW (ref >60)
GLUCOSE: 131 mg/dL — AB (ref 65–99)
Potassium: 3.6 mmol/L (ref 3.5–5.1)
SODIUM: 137 mmol/L (ref 135–145)
Total Bilirubin: 0.3 mg/dL (ref 0.3–1.2)
Total Protein: 7.9 g/dL (ref 6.5–8.1)

## 2017-01-18 LAB — CBC WITH DIFFERENTIAL/PLATELET
BASOS ABS: 0 10*3/uL (ref 0.0–0.1)
BASOS PCT: 0 %
EOS ABS: 0.4 10*3/uL (ref 0.0–0.7)
Eosinophils Relative: 2 %
HCT: 36.6 % (ref 36.0–46.0)
HEMOGLOBIN: 11.2 g/dL — AB (ref 12.0–15.0)
Lymphocytes Relative: 30 %
Lymphs Abs: 4.5 10*3/uL — ABNORMAL HIGH (ref 0.7–4.0)
MCH: 25.9 pg — ABNORMAL LOW (ref 26.0–34.0)
MCHC: 30.6 g/dL (ref 30.0–36.0)
MCV: 84.5 fL (ref 78.0–100.0)
Monocytes Absolute: 1.2 10*3/uL — ABNORMAL HIGH (ref 0.1–1.0)
Monocytes Relative: 8 %
NEUTROS PCT: 60 %
Neutro Abs: 8.7 10*3/uL — ABNORMAL HIGH (ref 1.7–7.7)
Platelets: 270 10*3/uL (ref 150–400)
RBC: 4.33 MIL/uL (ref 3.87–5.11)
RDW: 14.2 % (ref 11.5–15.5)
WBC: 14.8 10*3/uL — AB (ref 4.0–10.5)

## 2017-01-18 LAB — LACTATE DEHYDROGENASE: LDH: 142 U/L (ref 98–192)

## 2017-01-19 LAB — BETA 2 MICROGLOBULIN, SERUM: Beta-2 Microglobulin: 3.3 mg/L — ABNORMAL HIGH (ref 0.6–2.4)

## 2017-01-20 LAB — KAPPA/LAMBDA LIGHT CHAINS
Kappa free light chain: 436.7 mg/L — ABNORMAL HIGH (ref 3.3–19.4)
Kappa, lambda light chain ratio: 17.82 — ABNORMAL HIGH (ref 0.26–1.65)
LAMDA FREE LIGHT CHAINS: 24.5 mg/L (ref 5.7–26.3)

## 2017-01-21 LAB — PROTEIN ELECTROPHORESIS, SERUM
A/G RATIO SPE: 0.6 — AB (ref 0.7–1.7)
Albumin ELP: 2.8 g/dL — ABNORMAL LOW (ref 2.9–4.4)
Alpha-1-Globulin: 0.3 g/dL (ref 0.0–0.4)
Alpha-2-Globulin: 1.1 g/dL — ABNORMAL HIGH (ref 0.4–1.0)
BETA GLOBULIN: 1.2 g/dL (ref 0.7–1.3)
GAMMA GLOBULIN: 2 g/dL — AB (ref 0.4–1.8)
Globulin, Total: 4.6 g/dL — ABNORMAL HIGH (ref 2.2–3.9)
M-SPIKE, %: 1.3 g/dL — AB
Total Protein ELP: 7.4 g/dL (ref 6.0–8.5)

## 2017-01-25 ENCOUNTER — Ambulatory Visit (HOSPITAL_COMMUNITY): Payer: Medicare Other

## 2017-02-15 ENCOUNTER — Encounter: Payer: Self-pay | Admitting: Gastroenterology

## 2017-02-15 ENCOUNTER — Encounter: Payer: Self-pay | Admitting: *Deleted

## 2017-02-15 ENCOUNTER — Ambulatory Visit (INDEPENDENT_AMBULATORY_CARE_PROVIDER_SITE_OTHER): Payer: Medicare Other | Admitting: Gastroenterology

## 2017-02-15 VITALS — BP 164/83 | HR 118 | Temp 97.3°F | Ht 67.5 in | Wt 234.4 lb

## 2017-02-15 DIAGNOSIS — R748 Abnormal levels of other serum enzymes: Secondary | ICD-10-CM

## 2017-02-15 DIAGNOSIS — K219 Gastro-esophageal reflux disease without esophagitis: Secondary | ICD-10-CM

## 2017-02-15 DIAGNOSIS — K5900 Constipation, unspecified: Secondary | ICD-10-CM | POA: Diagnosis not present

## 2017-02-15 DIAGNOSIS — R1310 Dysphagia, unspecified: Secondary | ICD-10-CM | POA: Diagnosis not present

## 2017-02-15 NOTE — Patient Instructions (Addendum)
Let's stop Protonix. Trial of Dexilant. I have given you samples to try once daily (with or without food).   For constipation: trial of the lower dosage Amitiza. It is 8 micrograms. You can do this once to twice a day.   Let me know how both work for you!  I have ordered an xray of your esophagus.  We will see you in 2 months.  Have a wonderful New Year!   Food Choices for Gastroesophageal Reflux Disease, Adult When you have gastroesophageal reflux disease (GERD), the foods you eat and your eating habits are very important. Choosing the right foods can help ease the discomfort of GERD. Consider working with a diet and nutrition specialist (dietitian) to help you make healthy food choices. What general guidelines should I follow? Eating plan  Choose healthy foods low in fat, such as fruits, vegetables, whole grains, low-fat dairy products, and lean meat, fish, and poultry.  Eat frequent, small meals instead of three large meals each day. Eat your meals slowly, in a relaxed setting. Avoid bending over or lying down until 2-3 hours after eating.  Limit high-fat foods such as fatty meats or fried foods.  Limit your intake of oils, butter, and shortening to less than 8 teaspoons each day.  Avoid the following: ? Foods that cause symptoms. These may be different for different people. Keep a food diary to keep track of foods that cause symptoms. ? Alcohol. ? Drinking large amounts of liquid with meals. ? Eating meals during the 2-3 hours before bed.  Cook foods using methods other than frying. This may include baking, grilling, or broiling. Lifestyle   Maintain a healthy weight. Ask your health care provider what weight is healthy for you. If you need to lose weight, work with your health care provider to do so safely.  Exercise for at least 30 minutes on 5 or more days each week, or as told by your health care provider.  Avoid wearing clothes that fit tightly around your waist and  chest.  Do not use any products that contain nicotine or tobacco, such as cigarettes and e-cigarettes. If you need help quitting, ask your health care provider.  Sleep with the head of your bed raised. Use a wedge under the mattress or blocks under the bed frame to raise the head of the bed. What foods are not recommended? The items listed may not be a complete list. Talk with your dietitian about what dietary choices are best for you. Grains Pastries or quick breads with added fat. [place] toast. Vegetables Deep fried vegetables. [place] fries. Any vegetables prepared with added fat. Any vegetables that cause symptoms. For some people this may include tomatoes and tomato products, chili peppers, onions and garlic, and horseradish. Fruits Any fruits prepared with added fat. Any fruits that cause symptoms. For some people this may include citrus fruits, such as oranges, grapefruit, pineapple, and lemons. Meats and other protein foods High-fat meats, such as fatty beef or pork, hot dogs, ribs, ham, sausage, salami and bacon. Fried meat or protein, including fried fish and fried chicken. Nuts and nut butters. Dairy Whole milk and chocolate milk. Sour cream. Cream. Ice cream. Cream cheese. Milk shakes. Beverages Coffee and tea, with or without caffeine. Carbonated beverages. Sodas. Energy drinks. Fruit juice made with acidic fruits (such as orange or grapefruit). Tomato juice. Alcoholic drinks. Fats and oils Butter. Margarine. Shortening. Ghee. Sweets and desserts Chocolate and cocoa. Donuts. Seasoning and other foods Pepper. Peppermint and spearmint. Any  condiments, herbs, or seasonings that cause symptoms. For some people, this may include curry, hot sauce, or vinegar-based salad dressings. Summary  When you have gastroesophageal reflux disease (GERD), food and lifestyle choices are very important to help ease the discomfort of GERD.  Eat frequent, small meals instead of three large meals  each day. Eat your meals slowly, in a relaxed setting. Avoid bending over or lying down until 2-3 hours after eating.  Limit high-fat foods such as fatty meat or fried foods. This information is not intended to replace advice given to you by your health care provider. Make sure you discuss any questions you have with your health care provider. Document Released: 02/02/2005 Document Revised: 02/04/2016 Document Reviewed: 02/04/2016 Elsevier Interactive Patient Education  [employer].

## 2017-02-15 NOTE — Progress Notes (Signed)
Referring Provider: Jani Gravel, MD Primary Care Physician:  Jani Gravel, MD Primary GI: Dr. Gala Romney   Chief Complaint  Patient presents with  . Constipation    f/u, better  . Gastroesophageal Reflux    HPI:   Stephanie [name] is a 66 y.o. female presenting today with a history of isolated elevation of alk phos, normal transaminases. EGD/TCS on file due to heme positive stool. Diverticulosis on colonoscopy and EGD normal with empiric dilation.   Thorough work-up of elevated alk phos in setting of fatty liver with AMA, ANA negative. ASMA moderately positive. Negative Hep B, C, A. Followed by oncology with history of nephrotic syndrome, IgG kappa monoclonal gammopathy, observed for now. Liver biopsy with mild chronic hepatitis, mild fatty liver, non significant fibrosis, non-specific findings and likely secondary to drug-induced etiology.   Constipation: did not do well with Linzess. Trial of Amitiza 24 mcg at last visit. Notes cervical dysphagia to solids and liquids. Protonix 40 mg daily. Feels worsening indigestion. Has to take baking soda as well, 2-3 times a day. Has tried Prilosec and Nexium before. Amitiza 24 mcg seems strong.   Past Medical History:  Diagnosis Date  . Anemia   . Arthritis   . Asthma   . COPD (chronic obstructive pulmonary disease) ([place])   . Deep vein thrombosis (DVT) of both lower extremities ([place]) 06/27/2015  . Diabetes mellitus   . Fibromyalgia   . GERD (gastroesophageal reflux disease)   . H/O hiatal hernia   . Hypercholesteremia   . Hypertension   . Hyperthyroidism   . IBS (irritable bowel syndrome)   . Inner ear disease   . 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 PONV (postoperative nausea and vomiting)   . Tachycardia     Past Surgical History:  Procedure Laterality Date  . ABDOMINAL HYSTERECTOMY  partial  . CARPAL TUNNEL RELEASE Right 1991  . CATARACT EXTRACTION W/PHACO Right 05/08/2013   Procedure: CATARACT EXTRACTION  PHACO AND INTRAOCULAR LENS PLACEMENT (IOC);  Surgeon: Tonny Branch, MD;  Location: [facility];  Service: Ophthalmology;  Laterality: Right;  CDE 10.31  . CATARACT EXTRACTION W/PHACO Left 08/17/2013   Procedure: CATARACT EXTRACTION PHACO AND INTRAOCULAR LENS PLACEMENT (IOC);  Surgeon: Tonny Branch, MD;  Location: [facility];  Service: Ophthalmology;  Laterality: Left;  CDE:9.03  . CHOLECYSTECTOMY    . COLONOSCOPY WITH PROPOFOL N/A 01/06/2016   Dr. Gala Romney: diverticulosis   . DENTAL SURGERY    . ESOPHAGOGASTRODUODENOSCOPY (EGD) WITH PROPOFOL N/A 01/06/2016   Dr. Gala Romney: normal s/p empiric dilation   . MALONEY DILATION N/A 01/06/2016   Procedure: Venia Minks DILATION;  Surgeon: Daneil Dolin, MD;  Location: [facility];  Service: Endoscopy;  Laterality: N/A;  . WRIST GANGLION EXCISION Left     Current Outpatient Medications  Medication Sig Dispense Refill  . acetaminophen (TYLENOL) 500 MG tablet Take 500 mg by mouth every 6 (six) hours as needed.    Marland Kitchen albuterol (PROVENTIL HFA;VENTOLIN HFA) 108 (90 BASE) MCG/ACT inhaler Inhale 2 puffs into the lungs every 6 (six) hours as needed for wheezing or shortness of breath.    Marland Kitchen albuterol (PROVENTIL) (2.5 MG/3ML) 0.083% nebulizer solution Take 2.5 mg by nebulization every 6 (six) hours as needed for wheezing or shortness of breath.    Marland Kitchen apixaban (ELIQUIS) 5 MG TABS tablet Take 10 mg by mouth BID through Friday 5/19 evening. Take 28m by mouth BID starting 5/20 AM. (Patient taking differently: Take 5 mg by mouth 2 (  two) times daily. ) 60 tablet 3  . Benzonatate (TESSALON PERLES PO) Take 1-2 capsules by mouth as needed.     . Bilberry, Vaccinium myrtillus, 100 MG CAPS Take 1 capsule by mouth 2 (two) times daily.    . cetirizine (ZYRTEC) 10 MG tablet Take 10 mg by mouth daily.    . Cholecalciferol 4000 units CAPS Take 1 capsule by mouth daily.    Marland Kitchen CINNAMON PO Take 2-4 capsules by mouth 2 (two) times daily as needed (high blood sugar).     . clobetasol ointment (TEMOVATE)  6.44 % Apply 1 application topically 2 (two) times daily as needed (rash).     Marland Kitchen Cod Liver Oil CAPS Take 1 capsule by mouth daily.    . Cyanocobalamin (VITAMIN B-12 PO) Take 1 tablet by mouth daily.      . diphenhydrAMINE (BENADRYL) 25 MG tablet Take 25 mg by mouth at bedtime as needed.    . Doxepin HCl 5 % CREA apply 2 grams (2 grams=2 inches) to affected area(s) 3 times daily.  5  . DULoxetine (CYMBALTA) 30 MG capsule Take 30 mg by mouth 2 (two) times daily.     . fluticasone (VERAMYST) 27.5 MCG/SPRAY nasal spray Place 2 sprays into the nose as needed.    . Fluticasone Furoate (ARNUITY ELLIPTA IN) Inhale 1 puff into the lungs daily.    Marland Kitchen HUMALOG KWIKPEN 100 UNIT/ML KiwkPen Inject 30 Units into the skin 3 (three) times daily. Hold if blood sugar is 130 or lower    . hydrOXYzine (ATARAX/VISTARIL) 25 MG tablet Take 25 mg by mouth every 8 (eight) hours as needed for itching.     . Insulin Glargine (LANTUS SOLOSTAR) 100 UNIT/ML Solostar Pen Inject 75 Units into the skin 2 (two) times daily.    Marland Kitchen levothyroxine (SYNTHROID, LEVOTHROID) 100 MCG tablet Take 100 mcg by mouth daily before breakfast.   0  . lidocaine (XYLOCAINE) 5 % ointment APPLY 2-3 GRAMS (1 GRAM=1 INCH) TO AFFECTED AREA 4 TIMES DAILY  5  . linagliptin (TRADJENTA) 5 MG TABS tablet Take 5 mg by mouth daily.    Marland Kitchen lisinopril (PRINIVIL,ZESTRIL) 2.5 MG tablet Take 2.5 mg by mouth daily.    Marland Kitchen lubiprostone (AMITIZA) 24 MCG capsule Take one to two times daily with food for constipation 60 capsule 3  . LYRICA 75 MG capsule Take 75 mg by mouth 2 (two) times daily.     . Melatonin 5 MG CAPS Take 5-10 mg by mouth at bedtime.     . metolazone (ZAROXOLYN) 5 MG tablet Take 1 tablet (5 mg total) by mouth 2 (two) times daily. 60 tablet 0  . mometasone (ELOCON) 0.1 % cream Apply 1 application topically daily as needed (rash).     . mupirocin ointment (BACTROBAN) 2 % Apply 1 application topically 2 (two) times daily as needed (irritation).     .  NONFORMULARY OR COMPOUNDED ITEM APPLY 1 GRAM ( 1 GRAM = 1 PUMP) TO THE AFFECTED AREA 4 TIMES A DAY  5  . pantoprazole (PROTONIX) 40 MG tablet Take 40 mg by mouth daily.     . pravastatin (PRAVACHOL) 40 MG tablet Take 60 mg by mouth every evening.     . prednisoLONE acetate (PRED FORTE) 1 % ophthalmic suspension 4 drops 2 (two) times daily as needed. Use for left ear pain    . tiZANidine (ZANAFLEX) 2 MG tablet Take 2 mg by mouth every 6 (six) hours as needed for muscle spasms.    Marland Kitchen  torsemide (DEMADEX) 20 MG tablet Take 2 tablets (40 mg total) by mouth daily. (Patient taking differently: Take 20-40 mg by mouth daily. May take an additional  16ms as needed for swelling) 60 tablet 0  . traMADol (ULTRAM) 50 MG tablet Take 1 tablet (50 mg total) by mouth every 6 (six) hours as needed. (Patient taking differently: Take 50 mg by mouth every 8 (eight) hours as needed. ) 16 tablet 0  . vitamin C (ASCORBIC ACID) 500 MG tablet Take 1,000 mg by mouth daily.    . vitamin E 400 UNIT capsule Take 400 Units by mouth daily.     No current facility-administered medications for this visit.     Allergies as of 02/15/2017 - Review Complete 02/15/2017  Allergen Reaction Noted  . Tetracyclines & related Anaphylaxis 09/19/2010  . Banana Hives and Nausea And Vomiting 05/04/2013  . Penicillins Rash 09/19/2010    Family History  Problem Relation Age of Onset  . Colon cancer Neg Hx     Social History   Socioeconomic History  . Marital status: Married    Spouse name: None  . Number of children: None  . Years of education: None  . Highest education level: None  Social Needs  . Financial resource strain: None  . Food insecurity - worry: None  . Food insecurity - inability: None  . Transportation needs - medical: None  . Transportation needs - non-medical: None  Occupational History  . None  Tobacco Use  . Smoking status: Former Smoker    Packs/day: 0.25    Years: 30.00    Pack years: 7.50    Types:  Cigarettes    Last attempt to quit: 02/17/2014    Years since quitting: 2.9  . Smokeless tobacco: Never Used  . Tobacco comment: some day smoker  Substance and Sexual Activity  . Alcohol use: No    Alcohol/week: 0.0 oz  . Drug use: No  . Sexual activity: No    Birth control/protection: Surgical  Other Topics Concern  . None  Social History Narrative  . None    Review of Systems: Gen: Denies fever, chills, anorexia. Denies fatigue, weakness, weight loss.  CV: Denies chest pain, palpitations, syncope, peripheral edema, and claudication. Resp: Denies dyspnea at rest, cough, wheezing, coughing up blood, and pleurisy. GI: see HPI  Derm: Denies rash, itching, dry skin Psych: Denies depression, anxiety, memory loss, confusion. No homicidal or suicidal ideation.  Heme: Denies bruising, bleeding, and enlarged lymph nodes.  Physical Exam: BP (!) 164/83   Pulse (!) 118   Temp (!) 97.3 F (36.3 C) (Oral)   Ht 5' 7.5" (1.715 m)   Wt 234 lb 6.4 oz (106.3 kg)   BMI 36.17 kg/m  General:   Alert and oriented. No distress noted. Pleasant and cooperative.  Head:  Normocephalic and atraumatic. Eyes:  Conjuctiva clear without scleral icterus. Mouth:  Oral mucosa pink and moist.  Abdomen:  +BS, soft, non-tender and non-distended. No rebound or guarding. No HSM or masses noted. Msk:  Symmetrical without gross deformities. Normal posture. Extremities:  Without edema. Neurologic:  Alert and  oriented x4 Psych:  Alert and cooperative. Normal mood and affect.  Lab Results  Component Value Date   ALT 31 01/18/2017   AST 31 01/18/2017   GGT 141 (H) 10/14/2015   ALKPHOS 261 (H) 01/18/2017   BILITOT 0.3 01/18/2017

## 2017-02-17 ENCOUNTER — Encounter: Payer: Self-pay | Admitting: Internal Medicine

## 2017-02-17 NOTE — Assessment & Plan Note (Signed)
Thorough evaluation, liver biopsy on file. Serial monitoring.

## 2017-02-17 NOTE — Assessment & Plan Note (Signed)
EGD with dilation last year. Reports dysphagia in cervical region with solids and liquids. Pursue BPE now.

## 2017-02-17 NOTE — Assessment & Plan Note (Signed)
Symptomatic GERD. Currently on Protonix daily. Previously tried Prilosec and Nexium. Will trial Dexilant, samples provided. Return in 2 months.

## 2017-02-17 NOTE — Assessment & Plan Note (Signed)
Reduce Amitiza to 8 mcg BID. Samples provided.

## 2017-02-18 ENCOUNTER — Ambulatory Visit (HOSPITAL_COMMUNITY): Payer: Medicare Other

## 2017-02-18 ENCOUNTER — Other Ambulatory Visit (HOSPITAL_COMMUNITY)
Admission: RE | Admit: 2017-02-18 | Discharge: 2017-02-18 | Disposition: A | Discharge status: Home / Self Care | Payer: Medicare Other | Source: Ambulatory Visit | Attending: Nephrology | Admitting: Nephrology

## 2017-02-18 DIAGNOSIS — E559 Vitamin D deficiency, unspecified: Secondary | ICD-10-CM | POA: Insufficient documentation

## 2017-02-18 DIAGNOSIS — D509 Iron deficiency anemia, unspecified: Secondary | ICD-10-CM | POA: Diagnosis not present

## 2017-02-18 DIAGNOSIS — Z79899 Other long term (current) drug therapy: Secondary | ICD-10-CM | POA: Diagnosis not present

## 2017-02-18 DIAGNOSIS — R809 Proteinuria, unspecified: Secondary | ICD-10-CM | POA: Insufficient documentation

## 2017-02-18 DIAGNOSIS — N183 Chronic kidney disease, stage 3 (moderate): Secondary | ICD-10-CM | POA: Insufficient documentation

## 2017-02-18 DIAGNOSIS — I10 Essential (primary) hypertension: Secondary | ICD-10-CM | POA: Insufficient documentation

## 2017-02-18 LAB — HEMOGLOBIN AND HEMATOCRIT, BLOOD
HCT: 40.4 % (ref 36.0–46.0)
Hemoglobin: 12.7 g/dL (ref 12.0–15.0)

## 2017-02-18 LAB — RENAL FUNCTION PANEL
ALBUMIN: 3.4 g/dL — AB (ref 3.5–5.0)
ANION GAP: 11 (ref 5–15)
BUN: 17 mg/dL (ref 6–20)
CHLORIDE: 100 mmol/L — AB (ref 101–111)
CO2: 24 mmol/L (ref 22–32)
Calcium: 9.4 mg/dL (ref 8.9–10.3)
Creatinine, Ser: 1.11 mg/dL — ABNORMAL HIGH (ref 0.44–1.00)
GFR calc Af Amer: 59 mL/min — ABNORMAL LOW (ref >60)
GFR calc non Af Amer: 51 mL/min — ABNORMAL LOW (ref >60)
GLUCOSE: 292 mg/dL — AB (ref 65–99)
PHOSPHORUS: 2.9 mg/dL (ref 2.5–4.6)
POTASSIUM: 3.4 mmol/L — AB (ref 3.5–5.1)
Sodium: 135 mmol/L (ref 135–145)

## 2017-02-18 LAB — PROTEIN / CREATININE RATIO, URINE
Creatinine, Urine: 379.69 mg/dL
Protein Creatinine Ratio: 0.35 mg/mg{Cre} — ABNORMAL HIGH (ref 0.00–0.15)
Total Protein, Urine: 132 mg/dL

## 2017-02-18 LAB — IRON AND TIBC
IRON: 72 ug/dL (ref 28–170)
Saturation Ratios: 21 % (ref 10.4–31.8)
TIBC: 344 ug/dL (ref 250–450)
UIBC: 272 ug/dL

## 2017-02-18 LAB — FERRITIN: FERRITIN: 58 ng/mL (ref 11–307)

## 2017-02-18 NOTE — Progress Notes (Signed)
CC'ED TO PCP 

## 2017-02-19 LAB — VITAMIN D 25 HYDROXY (VIT D DEFICIENCY, FRACTURES): VIT D 25 HYDROXY: 20.2 ng/mL — AB (ref 30.0–100.0)

## 2017-02-19 LAB — PARATHYROID HORMONE, INTACT (NO CA): PTH: 21 pg/mL (ref 15–65)

## 2017-02-22 ENCOUNTER — Encounter (HOSPITAL_COMMUNITY): Payer: Self-pay | Admitting: Oncology

## 2017-02-22 ENCOUNTER — Other Ambulatory Visit: Payer: Self-pay

## 2017-02-22 ENCOUNTER — Ambulatory Visit (HOSPITAL_COMMUNITY)
Admission: RE | Admit: 2017-02-22 | Discharge: 2017-02-22 | Disposition: A | Discharge status: Home / Self Care | Payer: Medicare Other | Source: Ambulatory Visit | Attending: Gastroenterology | Admitting: Gastroenterology

## 2017-02-22 ENCOUNTER — Inpatient Hospital Stay (HOSPITAL_COMMUNITY): Payer: Medicare Other | Attending: Hematology and Oncology | Admitting: Oncology

## 2017-02-22 VITALS — BP 139/62 | HR 85 | Temp 98.0°F | Resp 20 | Ht 67.5 in | Wt 236.1 lb

## 2017-02-22 DIAGNOSIS — K219 Gastro-esophageal reflux disease without esophagitis: Secondary | ICD-10-CM | POA: Insufficient documentation

## 2017-02-22 DIAGNOSIS — K589 Irritable bowel syndrome without diarrhea: Secondary | ICD-10-CM | POA: Insufficient documentation

## 2017-02-22 DIAGNOSIS — M797 Fibromyalgia: Secondary | ICD-10-CM | POA: Diagnosis not present

## 2017-02-22 DIAGNOSIS — Z86718 Personal history of other venous thrombosis and embolism: Secondary | ICD-10-CM | POA: Diagnosis not present

## 2017-02-22 DIAGNOSIS — D472 Monoclonal gammopathy: Secondary | ICD-10-CM

## 2017-02-22 DIAGNOSIS — R1310 Dysphagia, unspecified: Secondary | ICD-10-CM | POA: Diagnosis not present

## 2017-02-22 DIAGNOSIS — E1165 Type 2 diabetes mellitus with hyperglycemia: Secondary | ICD-10-CM | POA: Diagnosis not present

## 2017-02-22 DIAGNOSIS — M4316 Spondylolisthesis, lumbar region: Secondary | ICD-10-CM | POA: Diagnosis not present

## 2017-02-22 DIAGNOSIS — M48061 Spinal stenosis, lumbar region without neurogenic claudication: Secondary | ICD-10-CM | POA: Insufficient documentation

## 2017-02-22 DIAGNOSIS — I10 Essential (primary) hypertension: Secondary | ICD-10-CM | POA: Insufficient documentation

## 2017-02-22 DIAGNOSIS — M5441 Lumbago with sciatica, right side: Secondary | ICD-10-CM | POA: Diagnosis not present

## 2017-02-22 DIAGNOSIS — K224 Dyskinesia of esophagus: Secondary | ICD-10-CM | POA: Insufficient documentation

## 2017-02-22 DIAGNOSIS — E559 Vitamin D deficiency, unspecified: Secondary | ICD-10-CM | POA: Diagnosis not present

## 2017-02-22 DIAGNOSIS — K222 Esophageal obstruction: Secondary | ICD-10-CM | POA: Diagnosis not present

## 2017-02-22 DIAGNOSIS — Z79899 Other long term (current) drug therapy: Secondary | ICD-10-CM | POA: Diagnosis not present

## 2017-02-22 DIAGNOSIS — F17210 Nicotine dependence, cigarettes, uncomplicated: Secondary | ICD-10-CM

## 2017-02-22 DIAGNOSIS — E119 Type 2 diabetes mellitus without complications: Secondary | ICD-10-CM | POA: Insufficient documentation

## 2017-02-22 DIAGNOSIS — K5730 Diverticulosis of large intestine without perforation or abscess without bleeding: Secondary | ICD-10-CM | POA: Diagnosis not present

## 2017-02-22 DIAGNOSIS — E039 Hypothyroidism, unspecified: Secondary | ICD-10-CM | POA: Insufficient documentation

## 2017-02-22 DIAGNOSIS — M5126 Other intervertebral disc displacement, lumbar region: Secondary | ICD-10-CM | POA: Diagnosis not present

## 2017-02-22 DIAGNOSIS — D509 Iron deficiency anemia, unspecified: Secondary | ICD-10-CM

## 2017-02-22 DIAGNOSIS — E7800 Pure hypercholesterolemia, unspecified: Secondary | ICD-10-CM | POA: Insufficient documentation

## 2017-02-22 DIAGNOSIS — Z7901 Long term (current) use of anticoagulants: Secondary | ICD-10-CM | POA: Diagnosis not present

## 2017-02-22 DIAGNOSIS — J449 Chronic obstructive pulmonary disease, unspecified: Secondary | ICD-10-CM | POA: Insufficient documentation

## 2017-02-22 DIAGNOSIS — E1140 Type 2 diabetes mellitus with diabetic neuropathy, unspecified: Secondary | ICD-10-CM | POA: Diagnosis not present

## 2017-02-22 DIAGNOSIS — G894 Chronic pain syndrome: Secondary | ICD-10-CM | POA: Diagnosis not present

## 2017-02-22 DIAGNOSIS — K449 Diaphragmatic hernia without obstruction or gangrene: Secondary | ICD-10-CM | POA: Insufficient documentation

## 2017-02-22 NOTE — Progress Notes (Signed)
HEMATOLOGY/ONCOLOGY PROGRESS NOTE  Date of Service: 02/23/2017  Patient Care Team: Jani Gravel, MD as PCP - General (Internal Medicine) Herminio Commons, MD as Attending Physician (Cardiology) Gala Romney Cristopher Estimable, MD as Consulting Physician (Gastroenterology) Dr Lowanda Foster MD (nephrology) PCP: Kevan Ny MD  CHIEF COMPLAINTS/PURPOSE OF CONSULTATION:  M spike on peripheral blood and nephrotic range proteinuria Anemia Bilateral DVT  HISTORY OF PRESENTING ILLNESS:  Stephanie [name] is a wonderful 67 y.o. female who returns for follow up of M spike in peripheral blood associated with nephrotic range proteinuria to rule out a plasma cell dyscrasia.  Patient has a PMH of hypertension, diabetes, fibromyalgia, hypothyroidism, COPD, irritable bowel syndrome.  She continues to have increasing left lower extremity swelling and is wearing TED hose.  Patient sees many medical professionals with several different prescribers.  Patient states she is to see her nephrologist tomorrow.  This morning she had a barium swallow test performed for progressive dysphasia by Dr. Seabron Spates office. It showed nonspecific esophageal motility disorder fixed esophageal stricture.  Patient states approximately 1 year ago she had her "esophagus stretched".  Patient has occasional shortness of breath with exertion and constipation which she takes Amitiza 24 mcg capsules twice daily.  She complains of numbness and burning in hands and feet and takes Lyrica and Cymbalta with some relief.  Previously patient was taking gabapentin.  She complains of rash and itching on chest, arms and face.  She has been prescribed several creams and patient states OTC Goldbond cream works best for her.  She has chronic back and right wrist pain and rates it a 4 out of 10 today.  She continues her muscle relaxant and tramadol for pain with relief.  Her appetite is 50%; energy 50%.  Patient states some days "I have no appetite".  Weight is stable.  Patient states  she thinks her iron levels are low because she feels "very tired".    MEDICAL HISTORY:  Past Medical History:  Diagnosis Date  . Anemia   . Arthritis   . Asthma   . COPD (chronic obstructive pulmonary disease) ([place])   . Deep vein thrombosis (DVT) of both lower extremities ([place]) 06/27/2015  . Diabetes mellitus   . Fibromyalgia   . GERD (gastroesophageal reflux disease)   . H/O hiatal hernia   . Hypercholesteremia   . Hypertension   . Hyperthyroidism   . IBS (irritable bowel syndrome)   . Inner ear disease   . 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 PONV (postoperative nausea and vomiting)   . Tachycardia     SURGICAL HISTORY: Past Surgical History:  Procedure Laterality Date  . ABDOMINAL HYSTERECTOMY  partial  . CARPAL TUNNEL RELEASE Right 1991  . CATARACT EXTRACTION W/PHACO Right 05/08/2013   Procedure: CATARACT EXTRACTION PHACO AND INTRAOCULAR LENS PLACEMENT (IOC);  Surgeon: Tonny Branch, MD;  Location: [facility];  Service: Ophthalmology;  Laterality: Right;  CDE 10.31  . CATARACT EXTRACTION W/PHACO Left 08/17/2013   Procedure: CATARACT EXTRACTION PHACO AND INTRAOCULAR LENS PLACEMENT (IOC);  Surgeon: Tonny Branch, MD;  Location: [facility];  Service: Ophthalmology;  Laterality: Left;  CDE:9.03  . CHOLECYSTECTOMY    . COLONOSCOPY WITH PROPOFOL N/A 01/06/2016   Dr. Gala Romney: diverticulosis   . DENTAL SURGERY    . ESOPHAGOGASTRODUODENOSCOPY (EGD) WITH PROPOFOL N/A 01/06/2016   Dr. Gala Romney: normal s/p empiric dilation   . MALONEY DILATION N/A 01/06/2016   Procedure: Venia Minks DILATION;  Surgeon: Daneil Dolin, MD;  [facility];  Service: Endoscopy;  Laterality: N/A;  . WRIST GANGLION EXCISION Left     SOCIAL HISTORY: Social History   Socioeconomic History  . Marital status: Married    Spouse name: Not on file  . Number of children: Not on file  . Years of education: Not on file  . Highest education level: Not on file  Social Needs  . Financial  resource strain: Not on file  . Food insecurity - worry: Not on file  . Food insecurity - inability: Not on file  . Transportation needs - medical: Not on file  . Transportation needs - non-medical: Not on file  Occupational History  . Not on file  Tobacco Use  . Smoking status: Former Smoker    Packs/day: 0.25    Years: 30.00    Pack years: 7.50    Types: Cigarettes    Last attempt to quit: 02/17/2014    Years since quitting: 3.0  . Smokeless tobacco: Never Used  . Tobacco comment: some day smoker  Substance and Sexual Activity  . Alcohol use: No    Alcohol/week: 0.0 oz  . Drug use: No  . Sexual activity: No    Birth control/protection: Surgical  Other Topics Concern  . Not on file  Social History Narrative  . Not on file    FAMILY HISTORY: Family History  Problem Relation Age of Onset  . Colon cancer Neg Hx     ALLERGIES:  is allergic to tetracyclines & related; banana; and penicillins.  MEDICATIONS:    REVIEW OF SYSTEMS: Review of Systems  Constitutional: Positive for malaise/fatigue. Negative for chills, fever and weight loss.  HENT: Negative.   Eyes: Negative.   Respiratory: Positive for shortness of breath.        With exertion  Cardiovascular: Positive for leg swelling. Negative for chest pain, palpitations and orthopnea.       Left leg.  Wearing TED has.  Keep legs elevated.  Gastrointestinal: Positive for constipation. Negative for abdominal pain (when palpated), nausea and vomiting.  Genitourinary: Negative.   Musculoskeletal: Positive for joint pain (arthritis).       Back and right wrist  Skin: Positive for itching and rash.       Face, chest, arms  Neurological: Positive for sensory change and weakness.       Bilateral lower/upper extremities managed with Lyrica and Cymbalta  Endo/Heme/Allergies: Negative.   Psychiatric/Behavioral: Negative.  Negative for depression and memory loss. The patient does not have insomnia.   All other systems reviewed  and are negative. 14 point review of systems was performed and is negative except as detailed under history of present illness and above  PHYSICAL EXAMINATION: ECOG PERFORMANCE STATUS: 2 - Symptomatic, <50% confined to bed  BP 139/62 (BP Location: Left Arm, Patient Position: Sitting)   Pulse 85   Temp 98 F (36.7 C) (Oral)   Resp 20   Ht 5' 7.5" (1.715 m)   Wt 236 lb 1.6 oz (107.1 kg)   SpO2 100%   BMI 36.43 kg/m   Physical Exam  Constitutional: She is oriented to person, place, and time and well-developed, well-nourished, and in no distress.  HENT:  Head: Normocephalic and atraumatic.  Eyes: EOM are normal. Pupils are equal, round, and reactive to light.  Neck: Normal range of motion. Neck supple.  Cardiovascular: Normal rate, regular rhythm and normal heart sounds.  Pulmonary/Chest: Effort normal and breath sounds normal.  Abdominal: Soft. Normal appearance and bowel sounds are normal. She exhibits  no distension and no mass. There is no tenderness. There is no rebound and no guarding.  Musculoskeletal: Normal range of motion.       Right ankle: She exhibits swelling.       Left ankle: She exhibits swelling.       Lumbar back: She exhibits tenderness.  Patient wearing TED hose.  Nonpitting edema in both lower extremities  Neurological: She is alert and oriented to person, place, and time. Gait normal.  Skin: Skin is warm, dry and intact. Rash noted. Rash is maculopapular.  On chest and several areas on forehead.  Nursing note and vitals reviewed.  LABORATORY DATA:  I have reviewed the data as listed  . CBC Latest Ref Rng & Units 02/18/2017 01/18/2017 12/30/2016  WBC 4.0 - 10.5 K/uL - 14.8(H) 16.2(H)  Hemoglobin 12.0 - 15.0 g/dL 12.7 11.2(L) 11.1(L)  Hematocrit 36.0 - 46.0 % 40.4 36.6 36.5  Platelets 150 - 400 K/uL - 270 294   . CBC    Component Value Date/Time   WBC 14.8 (H) 01/18/2017 1301   RBC 4.33 01/18/2017 1301   HGB 12.7 02/18/2017 1056   HCT 40.4 02/18/2017  1056   PLT 270 01/18/2017 1301   MCV 84.5 01/18/2017 1301   MCH 25.9 (L) 01/18/2017 1301   MCHC 30.6 01/18/2017 1301   RDW 14.2 01/18/2017 1301   LYMPHSABS 4.5 (H) 01/18/2017 1301   MONOABS 1.2 (H) 01/18/2017 1301   EOSABS 0.4 01/18/2017 1301   BASOSABS 0.0 01/18/2017 1301    . CMP Latest Ref Rng & Units 02/18/2017 01/18/2017 12/30/2016  Glucose 65 - 99 mg/dL 292(H) 131(H) 145(H)  BUN 6 - 20 mg/dL 17 15 29(H)  Creatinine 0.44 - 1.00 mg/dL 1.11(H) 1.06(H) 1.35(H)  Sodium 135 - 145 mmol/L 135 137 134(L)  Potassium 3.5 - 5.1 mmol/L 3.4(L) 3.6 3.7  Chloride 101 - 111 mmol/L 100(L) 105 97(L)  CO2 22 - 32 mmol/L 24 25 29   Calcium 8.9 - 10.3 mg/dL 9.4 9.0 8.9  Total Protein 6.5 - 8.1 g/dL - 7.9 8.2(H)  Total Bilirubin 0.3 - 1.2 mg/dL - 0.3 0.5  Alkaline Phos 38 - 126 U/L - 261(H) 299(H)  AST 15 - 41 U/L - 31 26  ALT 14 - 54 U/L - 31 56(H)    PATHOLOGY:   RADIOGRAPHIC STUDIES: I have personally reviewed the radiological images as listed and agreed with the findings in the report.  CT Abdomen Pelvis 12/26/2015 IMPRESSION: There is focal wall thickening in the distal stomach region. Question localized distal gastritis. No other bowel wall thickening evident. No bowel obstruction. No diverticulitis. No free air or portal venous air. Scattered sigmoid diverticula are noted without inflammation.  No abscess.  Appendix appears normal.  Gallbladder and uterus absent.  Degenerative changes noted in the lumbar spine with spondylolisthesis L4-5. Focal spinal stenosis along the inferior aspect of L4-5 is noted, due to combination of disc protrusion, bony hypertrophy, and spondylolisthesis.  Oral contrast is seen in the distal esophagus, consistent with a degree of spontaneous gastroesophageal reflux.  There is mild aortoiliac atherosclerosis.  MRI Brain w/o Contrast 12/25/2015 IMPRESSION: 1. Unremarkable appearance of the brain and internal auditory canals. No evidence of  vestibular schwannoma on this unenhanced study. 2. Trace bilateral mastoid fluid.  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 etiology IgG kappa monoclonal gammopathy 0.9 g/dl Eliquis, chronic anticoagulation  Personally reviewed all of  her studies to date and provided her copy. Most recent bone marrow biopsy showed approximately 9% plasma cells kappa restricted. Continue close observation. Most recent M spike was from approximately 1 month ago resulting at 1.3 which appears to be her baseline. Kappa Lambda light chain 17.8 (23.15), Beta-2 microglobulin 3.3 ( 3.5). As far as the patient's anemia, hemoglobin remains stable at 12.7 (11.2). Ferritin 58 (100). Will have her return in 2-3 months with continued labs. She is in agreement.  She will continue to follow-up with nephrology. Recent labs show a continued low vitamin D level (20.2) . Patient has had many adjustments to her vitamin D supplement in the past and is currently taking 2000 units daily. She has an appointment tomorrow with nephrology. Will let them make adjustments to her vitamin D. She has not had a renal biopsy, this may be the next step and we can discuss with her nephrologist, Dr. Burnett Sheng.      Labs reviewed.  Continue observation of her labs.  There is a 1% chance per year of her MDS progressing to multiple myeloma. Dysphasia: Continue to follow-up with GI.  Had barium swallow today. Does not appear to have any esophageal stricture but does note nonspecific esophageal motility disorder Atopic dermatitis: Chest and face.  Continue Goldbond OTC as needed. Iron Deficiency: Stable. According to calculations, she is mildly iron deficient with an iron deficit of 9.2 mg. She does not require iron at this time. Will monitor ferritin levels at next visit.   She will return for follow up in 4 months with labs.   Orders Placed  This Encounter  Procedures  . Iron and TIBC    Standing Status:   Future    Standing Expiration Date:   02/23/2018  . Ferritin    Standing Status:   Future    Standing Expiration Date:   02/23/2018  . Vitamin D 25 hydroxy    Standing Status:   Future    Standing Expiration Date:   02/23/2018  . Hemoglobin and hematocrit, blood    Standing Status:   Future    Standing Expiration Date:   02/23/2018  . Renal function panel    Standing Status:   Future    Standing Expiration Date:   02/23/2018  . PTH, intact and calcium    Standing Status:   Future    Standing Expiration Date:   02/23/2018  . Protein / Creatinine Ratio, Urine    Standing Status:   Future    Standing Expiration Date:   02/23/2018  . Multiple Myeloma Panel (SPEP&IFE w/QIG)    Standing Status:   Future    Standing Expiration Date:   02/23/2018    All of the patients questions were answered with apparent satisfaction. The patient knows to call the clinic with any problems, questions or concerns.   I have reviewed the above documentation for accuracy and completeness, and I agree with the above. Faythe Casa, NP 02/23/2017 9:27 AM

## 2017-02-25 NOTE — Progress Notes (Signed)
BPE with esophageal dysmotility. Non-specific. NO stricture. She was given samples of Dexilant. Hopefully this has helped her?

## 2017-03-01 ENCOUNTER — Telehealth: Payer: Self-pay | Admitting: Internal Medicine

## 2017-03-01 DIAGNOSIS — K219 Gastro-esophageal reflux disease without esophagitis: Secondary | ICD-10-CM

## 2017-03-01 NOTE — Telephone Encounter (Signed)
PATIENT CALLED AND STATED THE DEXILANT SAMPLES WORKED AND WOULD LIKE A PRESCRIPTION WENT TO [facility]  [phone]

## 2017-03-01 NOTE — Telephone Encounter (Signed)
Routing to [facility] Refill.  

## 2017-03-02 MED ORDER — DEXLANSOPRAZOLE 60 MG PO CPDR: 60.0000 mg | DELAYED_RELEASE_CAPSULE | Freq: Every day | ORAL | 5 refills | Status: DC

## 2017-03-02 NOTE — Telephone Encounter (Signed)
Rx sent to pharmacy per request.  

## 2017-03-02 NOTE — Addendum Note (Signed)
Addended by: Gordy Levan, Charna Neeb A on: 03/02/2017 12:04 PM   Modules accepted: Orders

## 2017-03-16 DIAGNOSIS — N183 Chronic kidney disease, stage 3 unspecified: Secondary | ICD-10-CM | POA: Insufficient documentation

## 2017-03-16 DIAGNOSIS — N1832 Chronic kidney disease, stage 3b: Secondary | ICD-10-CM | POA: Insufficient documentation

## 2017-03-16 DIAGNOSIS — E1121 Type 2 diabetes mellitus with diabetic nephropathy: Secondary | ICD-10-CM | POA: Diagnosis not present

## 2017-03-16 DIAGNOSIS — D631 Anemia in chronic kidney disease: Secondary | ICD-10-CM | POA: Insufficient documentation

## 2017-03-16 DIAGNOSIS — R809 Proteinuria, unspecified: Secondary | ICD-10-CM | POA: Diagnosis not present

## 2017-03-16 DIAGNOSIS — I4891 Unspecified atrial fibrillation: Secondary | ICD-10-CM | POA: Insufficient documentation

## 2017-03-16 DIAGNOSIS — J449 Chronic obstructive pulmonary disease, unspecified: Secondary | ICD-10-CM | POA: Diagnosis not present

## 2017-03-16 DIAGNOSIS — I10 Essential (primary) hypertension: Secondary | ICD-10-CM | POA: Diagnosis not present

## 2017-03-16 DIAGNOSIS — I82403 Acute embolism and thrombosis of unspecified deep veins of lower extremity, bilateral: Secondary | ICD-10-CM | POA: Insufficient documentation

## 2017-03-16 DIAGNOSIS — E872 Acidosis, unspecified: Secondary | ICD-10-CM | POA: Insufficient documentation

## 2017-04-05 DIAGNOSIS — M25531 Pain in right wrist: Secondary | ICD-10-CM | POA: Diagnosis not present

## 2017-04-05 DIAGNOSIS — M25511 Pain in right shoulder: Secondary | ICD-10-CM | POA: Diagnosis not present

## 2017-04-05 DIAGNOSIS — M19049 Primary osteoarthritis, unspecified hand: Secondary | ICD-10-CM | POA: Diagnosis not present

## 2017-04-05 DIAGNOSIS — M19041 Primary osteoarthritis, right hand: Secondary | ICD-10-CM | POA: Diagnosis not present

## 2017-04-13 DIAGNOSIS — M25511 Pain in right shoulder: Secondary | ICD-10-CM | POA: Diagnosis not present

## 2017-04-19 DIAGNOSIS — M25511 Pain in right shoulder: Secondary | ICD-10-CM | POA: Diagnosis not present

## 2017-04-19 DIAGNOSIS — M75121 Complete rotator cuff tear or rupture of right shoulder, not specified as traumatic: Secondary | ICD-10-CM | POA: Diagnosis not present

## 2017-04-26 DIAGNOSIS — I10 Essential (primary) hypertension: Secondary | ICD-10-CM | POA: Diagnosis not present

## 2017-04-26 DIAGNOSIS — Z0001 Encounter for general adult medical examination with abnormal findings: Secondary | ICD-10-CM | POA: Diagnosis not present

## 2017-04-26 DIAGNOSIS — M79602 Pain in left arm: Secondary | ICD-10-CM | POA: Diagnosis not present

## 2017-04-26 DIAGNOSIS — M25511 Pain in right shoulder: Secondary | ICD-10-CM | POA: Diagnosis not present

## 2017-04-26 DIAGNOSIS — E1165 Type 2 diabetes mellitus with hyperglycemia: Secondary | ICD-10-CM | POA: Diagnosis not present

## 2017-04-26 DIAGNOSIS — E785 Hyperlipidemia, unspecified: Secondary | ICD-10-CM | POA: Diagnosis not present

## 2017-04-28 ENCOUNTER — Other Ambulatory Visit (HOSPITAL_COMMUNITY)
Admission: RE | Admit: 2017-04-28 | Discharge: 2017-04-28 | Disposition: A | Discharge status: Home / Self Care | Payer: Medicare Other | Source: Ambulatory Visit | Attending: Internal Medicine | Admitting: Internal Medicine

## 2017-04-28 DIAGNOSIS — E118 Type 2 diabetes mellitus with unspecified complications: Secondary | ICD-10-CM | POA: Diagnosis not present

## 2017-04-28 DIAGNOSIS — I10 Essential (primary) hypertension: Secondary | ICD-10-CM | POA: Diagnosis not present

## 2017-04-28 LAB — LIPID PANEL
CHOL/HDL RATIO: 3.1 ratio
Cholesterol: 212 mg/dL — ABNORMAL HIGH (ref 0–200)
HDL: 69 mg/dL (ref >40)
LDL Cholesterol: 110 mg/dL — ABNORMAL HIGH (ref 0–99)
Triglycerides: 164 mg/dL — ABNORMAL HIGH (ref <150)
VLDL: 33 mg/dL (ref 0–40)

## 2017-04-28 LAB — HEMOGLOBIN A1C
Hgb A1c MFr Bld: 10 % — ABNORMAL HIGH (ref 4.8–5.6)
Mean Plasma Glucose: 240.3 mg/dL

## 2017-05-05 ENCOUNTER — Ambulatory Visit: Payer: Medicare Other | Admitting: Gastroenterology

## 2017-05-11 DIAGNOSIS — M75121 Complete rotator cuff tear or rupture of right shoulder, not specified as traumatic: Secondary | ICD-10-CM | POA: Diagnosis not present

## 2017-05-11 DIAGNOSIS — M25511 Pain in right shoulder: Secondary | ICD-10-CM | POA: Diagnosis not present

## 2017-05-14 DIAGNOSIS — J449 Chronic obstructive pulmonary disease, unspecified: Secondary | ICD-10-CM | POA: Diagnosis not present

## 2017-05-16 ENCOUNTER — Other Ambulatory Visit: Payer: Self-pay | Admitting: Gastroenterology

## 2017-05-17 DIAGNOSIS — M25511 Pain in right shoulder: Secondary | ICD-10-CM | POA: Diagnosis not present

## 2017-05-17 DIAGNOSIS — E785 Hyperlipidemia, unspecified: Secondary | ICD-10-CM | POA: Diagnosis not present

## 2017-05-17 DIAGNOSIS — J4520 Mild intermittent asthma, uncomplicated: Secondary | ICD-10-CM | POA: Diagnosis not present

## 2017-05-17 DIAGNOSIS — I10 Essential (primary) hypertension: Secondary | ICD-10-CM | POA: Diagnosis not present

## 2017-05-17 DIAGNOSIS — E1165 Type 2 diabetes mellitus with hyperglycemia: Secondary | ICD-10-CM | POA: Diagnosis not present

## 2017-05-17 NOTE — Telephone Encounter (Signed)
Check on dose. At last OV she was reduced to 68mcg bid but refill request is for 12mcg

## 2017-05-18 NOTE — Telephone Encounter (Signed)
Lmom, waiting on a return call.  

## 2017-05-18 NOTE — Telephone Encounter (Signed)
Pt returned call. Pt would like the lower dosage of Amitiza 39mcg.

## 2017-05-19 ENCOUNTER — Other Ambulatory Visit: Payer: Self-pay | Admitting: *Deleted

## 2017-05-19 MED ORDER — LUBIPROSTONE 8 MCG PO CAPS: 8.0000 ug | ORAL_CAPSULE | Freq: Two times a day (BID) | ORAL | 11 refills | Status: DC

## 2017-05-21 ENCOUNTER — Ambulatory Visit (HOSPITAL_COMMUNITY)
Admission: RE | Admit: 2017-05-21 | Discharge: 2017-05-21 | Disposition: A | Discharge status: Home / Self Care | Payer: Medicare Other | Source: Ambulatory Visit | Attending: Internal Medicine | Admitting: Internal Medicine

## 2017-05-21 ENCOUNTER — Other Ambulatory Visit (HOSPITAL_COMMUNITY): Payer: Self-pay | Admitting: Respiratory Therapy

## 2017-05-21 DIAGNOSIS — Z029 Encounter for administrative examinations, unspecified: Secondary | ICD-10-CM | POA: Insufficient documentation

## 2017-05-24 ENCOUNTER — Encounter: Payer: Self-pay | Admitting: Gastroenterology

## 2017-05-24 ENCOUNTER — Ambulatory Visit (INDEPENDENT_AMBULATORY_CARE_PROVIDER_SITE_OTHER): Payer: Medicare Other | Admitting: Gastroenterology

## 2017-05-24 VITALS — BP 127/67 | HR 88 | Temp 98.2°F | Ht 67.5 in | Wt 230.8 lb

## 2017-05-24 DIAGNOSIS — K5900 Constipation, unspecified: Secondary | ICD-10-CM

## 2017-05-24 DIAGNOSIS — R748 Abnormal levels of other serum enzymes: Secondary | ICD-10-CM

## 2017-05-24 DIAGNOSIS — K219 Gastro-esophageal reflux disease without esophagitis: Secondary | ICD-10-CM

## 2017-05-24 NOTE — Patient Instructions (Signed)
1. Continue Amitiza 53mcg twice daily for constipation. 2. Continue Dexilant once daily for reflux.  3. We will see you back in six months. Call sooner if needed.

## 2017-05-24 NOTE — Assessment & Plan Note (Signed)
Doing better on Amitiza 8 mcg twice daily.  Return to the office in 6 months or sooner if needed.

## 2017-05-24 NOTE — Assessment & Plan Note (Signed)
Thorough evaluation as outlined above.  We will continue to monitor along with labs obtained by hematology.  Currently up-to-date with stable alkaline phosphatase.  Return to the office in 6 months or sooner if

## 2017-05-24 NOTE — Progress Notes (Signed)
cc'ed to pcp °

## 2017-05-24 NOTE — Progress Notes (Signed)
Primary Care Physician: Jani Gravel, MD  Primary Gastroenterologist:  Garfield Cornea, MD   Chief Complaint  Patient presents with  . Constipation    HPI: Stephanie [name] is a 67 y.o. female here for follow-up.  Last seen in December.  She has a history of isolated alkaline phosphatase elevation, normal transaminases, constipation, GERD.  EGD and colonoscopy in 2017 for heme positive stool showed diverticulosis, normal EGD with empiric dilation of the esophagus.  Thorough work-up of elevated alk phos in setting of fatty liver with AMA, ANA negative. ASMA moderately positive. Negative Hep B, C, A. Followed by oncology with history of nephrotic syndrome, IgG kappa monoclonal gammopathy, observed for now.Liver biopsy with mild chronic hepatitis, mild fatty liver, non significant fibrosis, non-specific findings and likely secondary to drug-induced etiology.  After last visit she did have a barium pill esophagram due to persistent dysphagia.  She had a nonspecific esophageal motility disorder.  No fixed esophageal stricture.  Switch to Dexilant and Amitiza 8 mcg twice daily.  She has had good control of her heartburn as well as her constipation.  No complaints today.  No abdominal pain, dysphagia is actually better with better control of her reflux.  No blood in the stool or melena.  Current Outpatient Medications  Medication Sig Dispense Refill  . acetaminophen (TYLENOL) 500 MG tablet Take 500 mg by mouth every 6 (six) hours as needed.    Marland Kitchen albuterol (PROVENTIL HFA;VENTOLIN HFA) 108 (90 BASE) MCG/ACT inhaler Inhale 2 puffs into the lungs every 6 (six) hours as needed for wheezing or shortness of breath.    Marland Kitchen albuterol (PROVENTIL) (2.5 MG/3ML) 0.083% nebulizer solution Take 2.5 mg by nebulization every 6 (six) hours as needed for wheezing or shortness of breath.    Marland Kitchen apixaban (ELIQUIS) 5 MG TABS tablet Take 10 mg by mouth BID through Friday 5/19 evening. Take 76m by mouth BID starting 5/20 AM.  (Patient taking differently: Take 5 mg by mouth 2 (two) times daily. ) 60 tablet 3  . Benzonatate (TESSALON PERLES PO) Take 1-2 capsules by mouth as needed.     . cetirizine (ZYRTEC) 10 MG tablet Take 10 mg by mouth daily.    . cholecalciferol (VITAMIN D) 1000 units tablet Take 2,000 Units by mouth daily.    .Marland KitchenCINNAMON PO Take 2-4 capsules by mouth 2 (two) times daily as needed (high blood sugar).     . clobetasol ointment (TEMOVATE) 00.09% Apply 1 application topically 2 (two) times daily as needed (rash).     .Marland KitchenCod Liver Oil CAPS Take 1 capsule by mouth daily.    . Cyanocobalamin (VITAMIN B-12 PO) Take 1 tablet by mouth daily.      .Marland Kitchendexlansoprazole (DEXILANT) 60 MG capsule Take 1 capsule (60 mg total) by mouth daily. 30 capsule 5  . diphenhydrAMINE (BENADRYL) 25 MG tablet Take 25 mg by mouth at bedtime as needed.    . Doxepin HCl 5 % CREA apply 2 grams (2 grams=2 inches) to affected area(s) 3 times daily. Takes as needed  5  . Dulaglutide (TRULICITY) 1.5 MFG/1.8EXSOPN Inject 1.5 mg into the skin once a week.    . DULoxetine (CYMBALTA) 30 MG capsule Take 30 mg by mouth 2 (two) times daily.     . fluticasone (VERAMYST) 27.5 MCG/SPRAY nasal spray Place 2 sprays into the nose as needed.    . Fluticasone Furoate (ARNUITY ELLIPTA IN) Inhale 1 puff into the lungs daily.    .Marland Kitchen  HUMALOG KWIKPEN 100 UNIT/ML KiwkPen Inject 30 Units into the skin 3 (three) times daily. Hold if blood sugar is 130 or lower    . hydrOXYzine (ATARAX/VISTARIL) 25 MG tablet Take 25 mg by mouth every 8 (eight) hours as needed for itching.     . Insulin Glargine (LANTUS SOLOSTAR) 100 UNIT/ML Solostar Pen Inject 75 Units into the skin 2 (two) times daily. 75 units in am, 80 units at night    . levothyroxine (SYNTHROID, LEVOTHROID) 100 MCG tablet Take 100 mcg by mouth daily before breakfast.   0  . lidocaine (XYLOCAINE) 5 % ointment APPLY 2-3 GRAMS (1 GRAM=1 INCH) TO AFFECTED AREA 4 TIMES DAILY  5  . lisinopril (PRINIVIL,ZESTRIL) 2.5  MG tablet Take 2.5 mg by mouth daily.    Marland Kitchen lubiprostone (AMITIZA) 8 MCG capsule Take 1 capsule (8 mcg total) by mouth 2 (two) times daily with a meal. 60 capsule 11  . LYRICA 75 MG capsule Take 75 mg by mouth 2 (two) times daily.     . Melatonin 5 MG CAPS Take 5-10 mg by mouth at bedtime.     . metaxalone (SKELAXIN) 800 MG tablet Take 800 mg by mouth 3 (three) times daily.    . metolazone (ZAROXOLYN) 5 MG tablet Take 1 tablet (5 mg total) by mouth 2 (two) times daily. 60 tablet 0  . mometasone (ELOCON) 0.1 % cream Apply 1 application topically daily as needed (rash).     . NONFORMULARY OR COMPOUNDED ITEM APPLY 1 GRAM ( 1 GRAM = 1 PUMP) TO THE AFFECTED AREA 4 TIMES A DAY  5  . pravastatin (PRAVACHOL) 40 MG tablet Take 60 mg by mouth every evening.     Marland Kitchen tiZANidine (ZANAFLEX) 2 MG tablet Take 2 mg by mouth every 6 (six) hours as needed for muscle spasms.    Marland Kitchen torsemide (DEMADEX) 20 MG tablet Take 2 tablets (40 mg total) by mouth daily. (Patient taking differently: Take 20 mg by mouth daily. May take an additional  46ms as needed for swelling) 60 tablet 0  . traMADol (ULTRAM) 50 MG tablet Take 1 tablet (50 mg total) by mouth every 6 (six) hours as needed. (Patient taking differently: Take 50 mg by mouth every 8 (eight) hours as needed. ) 16 tablet 0  . vitamin C (ASCORBIC ACID) 500 MG tablet Take 1,000 mg by mouth daily.    . vitamin E 400 UNIT capsule Take 400 Units by mouth daily.     No current facility-administered medications for this visit.     Allergies as of 05/24/2017 - Review Complete 05/24/2017  Allergen Reaction Noted  . Tetracyclines & related Anaphylaxis 09/19/2010  . Banana Hives and Nausea And Vomiting 05/04/2013  . Penicillins Rash 09/19/2010    ROS:  General: Negative for anorexia, weight loss, fever, chills, fatigue, weakness. ENT: Negative for hoarseness, difficulty swallowing , nasal congestion. CV: Negative for chest pain, angina, palpitations, dyspnea on exertion,  peripheral edema.  Respiratory: Negative for dyspnea at rest, dyspnea on exertion, cough, sputum, wheezing.  GI: See history of present illness. GU:  Negative for dysuria, hematuria, urinary incontinence, urinary frequency, nocturnal urination.  Endo: Negative for unusual weight change.    Physical Examination:   BP 127/67   Pulse 88   Temp 98.2 F (36.8 C) (Oral)   Ht 5' 7.5" (1.715 m)   Wt 230 lb 12.8 oz (104.7 kg)   BMI 35.61 kg/m   General: Well-nourished, well-developed in no acute distress.  Eyes:  No icterus. Mouth: Oropharyngeal mucosa moist and pink , no lesions erythema or exudate. Lungs: Clear to auscultation bilaterally.  Heart: Regular rate and rhythm, no murmurs rubs or gallops.  Abdomen: Bowel sounds are normal, nontender, nondistended, no hepatosplenomegaly or masses, no abdominal bruits or hernia , no rebound or guarding.   Extremities: No lower extremity edema. No clubbing or deformities. Neuro: Alert and oriented x 4   Skin: Warm and dry, no jaundice.   Psych: Alert and cooperative, normal mood and affect.  Labs:  Lab Results  Component Value Date   CREATININE 1.11 (H) 02/18/2017   BUN 17 02/18/2017   NA 135 02/18/2017   K 3.4 (L) 02/18/2017   CL 100 (L) 02/18/2017   CO2 24 02/18/2017   Lab Results  Component Value Date   ALT 31 01/18/2017   AST 31 01/18/2017         ALKPHOS 261 (H) 01/18/2017   BILITOT 0.3 01/18/2017   Lab Results  Component Value Date         HGB 12.7 02/18/2017   HCT 40.4 02/18/2017    Lab Results  Component Value Date   IRON 72 02/18/2017   TIBC 344 02/18/2017   FERRITIN 58 02/18/2017    Imaging Studies: No results found.

## 2017-05-24 NOTE — Assessment & Plan Note (Signed)
Doing much better on Dexilant.  Continue once daily.  Return to the office in 6 months or sooner if needed.

## 2017-06-05 DIAGNOSIS — J449 Chronic obstructive pulmonary disease, unspecified: Secondary | ICD-10-CM | POA: Diagnosis not present

## 2017-06-21 ENCOUNTER — Inpatient Hospital Stay (HOSPITAL_COMMUNITY): Payer: Medicare Other

## 2017-06-21 ENCOUNTER — Encounter (HOSPITAL_COMMUNITY): Payer: Self-pay | Admitting: Hematology

## 2017-06-21 ENCOUNTER — Other Ambulatory Visit: Payer: Self-pay

## 2017-06-21 ENCOUNTER — Inpatient Hospital Stay (HOSPITAL_COMMUNITY): Payer: Medicare Other | Attending: Hematology | Admitting: Hematology

## 2017-06-21 VITALS — BP 110/54 | HR 80 | Temp 98.5°F | Resp 16 | Wt 233.6 lb

## 2017-06-21 DIAGNOSIS — D472 Monoclonal gammopathy: Secondary | ICD-10-CM | POA: Diagnosis not present

## 2017-06-21 DIAGNOSIS — Z86718 Personal history of other venous thrombosis and embolism: Secondary | ICD-10-CM | POA: Diagnosis not present

## 2017-06-21 DIAGNOSIS — D509 Iron deficiency anemia, unspecified: Secondary | ICD-10-CM

## 2017-06-21 DIAGNOSIS — M797 Fibromyalgia: Secondary | ICD-10-CM | POA: Insufficient documentation

## 2017-06-21 DIAGNOSIS — Z7901 Long term (current) use of anticoagulants: Secondary | ICD-10-CM | POA: Diagnosis not present

## 2017-06-21 DIAGNOSIS — Z79899 Other long term (current) drug therapy: Secondary | ICD-10-CM | POA: Diagnosis not present

## 2017-06-21 LAB — HEMOGLOBIN AND HEMATOCRIT, BLOOD
HCT: 35 % — ABNORMAL LOW (ref 36.0–46.0)
Hemoglobin: 11 g/dL — ABNORMAL LOW (ref 12.0–15.0)

## 2017-06-21 LAB — RENAL FUNCTION PANEL
ALBUMIN: 3.3 g/dL — AB (ref 3.5–5.0)
Anion gap: 10 (ref 5–15)
BUN: 39 mg/dL — ABNORMAL HIGH (ref 6–20)
CALCIUM: 9.1 mg/dL (ref 8.9–10.3)
CO2: 31 mmol/L (ref 22–32)
CREATININE: 1.66 mg/dL — AB (ref 0.44–1.00)
Chloride: 95 mmol/L — ABNORMAL LOW (ref 101–111)
GFR calc non Af Amer: 31 mL/min — ABNORMAL LOW (ref >60)
GFR, EST AFRICAN AMERICAN: 36 mL/min — AB (ref >60)
Glucose, Bld: 122 mg/dL — ABNORMAL HIGH (ref 65–99)
Phosphorus: 3 mg/dL (ref 2.5–4.6)
Potassium: 3.5 mmol/L (ref 3.5–5.1)
SODIUM: 136 mmol/L (ref 135–145)

## 2017-06-21 NOTE — Progress Notes (Signed)
Patient Care Team: Jani Gravel, MD as PCP - General (Internal Medicine) Herminio Commons, MD as Attending Physician (Cardiology) Daneil Dolin, MD as Consulting Physician (Gastroenterology)  DIAGNOSIS:  Encounter Diagnosis  Name Primary?  Marland Kitchen MGUS (monoclonal gammopathy of unknown significance) Yes     CHIEF COMPLIANT: Follow-up of MGUS.  INTERVAL HISTORY: Stephanie [name] is seen as a follow-up for MGUS and iron deficiency anemia.  Denies any bleeding per rectum or melena.  Denies any new onset bone pains.  She has chronic pains in her legs.  She is taking tramadol, Tylenol Cymbalta and Lyrica.  She denies any fevers, night sweats or weight loss.  Feels very tired.  Has occasional leg swellings.  Has some numbness and burning in the legs.  REVIEW OF SYSTEMS:   Constitutional: Denies fevers, chills or abnormal weight loss.  Fatigue present. Eyes: Denies blurriness of vision Ears, nose, mouth, throat, and face: Denies mucositis or sore throat Respiratory: Denies cough, dyspnea or wheezes Cardiovascular: Denies palpitation, chest discomfort.  Occasional leg swelling. Gastrointestinal: Diarrhea alternating with constipation. Skin: Denies abnormal skin rashes Lymphatics: Denies new lymphadenopathy or easy bruising Neurological:Denies numbness, tingling or new weaknesses Behavioral/Psych: Mood is stable, no new changes  Extremities: No lower extremity edema All other systems were reviewed with the patient and are negative.  I have reviewed the past medical history, past surgical history, social history and family history with the patient and they are unchanged from previous note.  ALLERGIES:  is allergic to tetracyclines & related; banana; and penicillins.  MEDICATIONS:  Current Outpatient Medications  Medication Sig Dispense Refill  . ACCU-CHEK AVIVA PLUS test strip   7  . acetaminophen (TYLENOL) 500 MG tablet Take 500 mg by mouth every 6 (six) hours as needed.    Marland Kitchen albuterol  (PROVENTIL HFA;VENTOLIN HFA) 108 (90 BASE) MCG/ACT inhaler Inhale 2 puffs into the lungs every 6 (six) hours as needed for wheezing or shortness of breath.    Marland Kitchen albuterol (PROVENTIL) (2.5 MG/3ML) 0.083% nebulizer solution Take 2.5 mg by nebulization every 6 (six) hours as needed for wheezing or shortness of breath.    Marland Kitchen apixaban (ELIQUIS) 5 MG TABS tablet Take 10 mg by mouth BID through Friday 5/19 evening. Take 5mg  by mouth BID starting 5/20 AM. (Patient taking differently: Take 5 mg by mouth 2 (two) times daily. ) 60 tablet 3  . ARNUITY ELLIPTA 100 MCG/ACT AEPB   11  . Benzonatate (TESSALON PERLES PO) Take 1-2 capsules by mouth as needed.     . cetirizine (ZYRTEC) 10 MG tablet Take 10 mg by mouth daily.    . cholecalciferol (VITAMIN D) 1000 units tablet Take 2,000 Units by mouth daily.    Marland Kitchen CINNAMON PO Take 2-4 capsules by mouth 2 (two) times daily as needed (high blood sugar).     . clobetasol ointment (TEMOVATE) 6.30 % Apply 1 application topically 2 (two) times daily as needed (rash).     Marland Kitchen Cod Liver Oil CAPS Take 1 capsule by mouth daily.    . Cyanocobalamin (VITAMIN B-12 PO) Take 1 tablet by mouth daily.      Marland Kitchen dexlansoprazole (DEXILANT) 60 MG capsule Take 1 capsule (60 mg total) by mouth daily. 30 capsule 5  . diphenhydrAMINE (BENADRYL) 25 MG tablet Take 25 mg by mouth at bedtime as needed.    . Doxepin HCl 5 % CREA apply 2 grams (2 grams=2 inches) to affected area(s) 3 times daily. Takes as needed  5  .  Dulaglutide (TRULICITY) 1.5 YS/0.6TK SOPN Inject 1.5 mg into the skin once a week.    . DULoxetine (CYMBALTA) 30 MG capsule Take 30 mg by mouth 2 (two) times daily.     . fluticasone (VERAMYST) 27.5 MCG/SPRAY nasal spray Place 2 sprays into the nose as needed.    Marland Kitchen HUMALOG KWIKPEN 100 UNIT/ML KiwkPen Inject 30 Units into the skin 3 (three) times daily. Hold if blood sugar is 130 or lower    . hydrOXYzine (ATARAX/VISTARIL) 25 MG tablet Take 25 mg by mouth every 8 (eight) hours as needed for  itching.     . Insulin Glargine (LANTUS SOLOSTAR) 100 UNIT/ML Solostar Pen Inject 75 Units into the skin 2 (two) times daily. 75 units in am, 80 units at night    . levothyroxine (SYNTHROID, LEVOTHROID) 100 MCG tablet Take 100 mcg by mouth daily before breakfast.   0  . lidocaine (XYLOCAINE) 5 % ointment APPLY 2-3 GRAMS (1 GRAM=1 INCH) TO AFFECTED AREA 4 TIMES DAILY  5  . lisinopril (PRINIVIL,ZESTRIL) 2.5 MG tablet Take 2.5 mg by mouth daily.    Marland Kitchen lubiprostone (AMITIZA) 8 MCG capsule Take 1 capsule (8 mcg total) by mouth 2 (two) times daily with a meal. 60 capsule 11  . LYRICA 75 MG capsule Take 75 mg by mouth 2 (two) times daily.     . Melatonin 5 MG CAPS Take 5-10 mg by mouth at bedtime.     . metaxalone (SKELAXIN) 800 MG tablet Take 800 mg by mouth 3 (three) times daily.    . metolazone (ZAROXOLYN) 5 MG tablet Take 1 tablet (5 mg total) by mouth 2 (two) times daily. 60 tablet 0  . metoprolol tartrate (LOPRESSOR) 100 MG tablet     . metoprolol tartrate (LOPRESSOR) 25 MG tablet     . mometasone (ELOCON) 0.1 % cream Apply 1 application topically daily as needed (rash).     . NONFORMULARY OR COMPOUNDED ITEM APPLY 1 GRAM ( 1 GRAM = 1 PUMP) TO THE AFFECTED AREA 4 TIMES A DAY  5  . NOVOFINE 32G X 6 MM MISC   6  . pantoprazole (PROTONIX) 40 MG tablet   12  . pravastatin (PRAVACHOL) 40 MG tablet Take 60 mg by mouth every evening.     Marland Kitchen PROAIR RESPICLICK 160 (90 Base) MCG/ACT AEPB     . tiZANidine (ZANAFLEX) 2 MG tablet Take 2 mg by mouth every 6 (six) hours as needed for muscle spasms.    Marland Kitchen tiZANidine (ZANAFLEX) 4 MG tablet     . torsemide (DEMADEX) 20 MG tablet Take 2 tablets (40 mg total) by mouth daily. (Patient taking differently: Take 20 mg by mouth daily. May take an additional  20mg s as needed for swelling) 60 tablet 0  . traMADol (ULTRAM) 50 MG tablet Take 1 tablet (50 mg total) by mouth every 6 (six) hours as needed. (Patient taking differently: Take 50 mg by mouth every 8 (eight) hours as  needed. ) 16 tablet 0  . vitamin C (ASCORBIC ACID) 500 MG tablet Take 1,000 mg by mouth daily.    . vitamin E 400 UNIT capsule Take 400 Units by mouth daily.     No current facility-administered medications for this visit.     PHYSICAL EXAMINATION: ECOG PERFORMANCE STATUS: 1 - Symptomatic but completely ambulatory  Vitals:   06/21/17 1605  BP: (!) 110/54  Pulse: 80  Resp: 16  Temp: 98.5 F (36.9 C)  SpO2: 100%   Filed Weights  06/21/17 1605  Weight: 233 lb 9.6 oz (106 kg)    GENERAL:alert, no distress and comfortable Still physical exam deferred. LABORATORY DATA:  I have reviewed the data as listed CMP Latest Ref Rng & Units 06/21/2017 02/18/2017 01/18/2017  Glucose 65 - 99 mg/dL 122(H) 292(H) 131(H)  BUN 6 - 20 mg/dL 39(H) 17 15  Creatinine 0.44 - 1.00 mg/dL 1.66(H) 1.11(H) 1.06(H)  Sodium 135 - 145 mmol/L 136 135 137  Potassium 3.5 - 5.1 mmol/L 3.5 3.4(L) 3.6  Chloride 101 - 111 mmol/L 95(L) 100(L) 105  CO2 22 - 32 mmol/L 31 24 25   Calcium 8.9 - 10.3 mg/dL 9.1 9.4 9.0  Total Protein 6.5 - 8.1 g/dL - - 7.9  Total Bilirubin 0.3 - 1.2 mg/dL - - 0.3  Alkaline Phos 38 - 126 U/L - - 261(H)  AST 15 - 41 U/L - - 31  ALT 14 - 54 U/L - - 31   No results found for: [id number]   Lab Results  Component Value Date   WBC 14.8 (H) 01/18/2017   HGB 11.0 (L) 06/21/2017   HCT 35.0 (L) 06/21/2017   MCV 84.5 01/18/2017   PLT 270 01/18/2017   NEUTROABS 8.7 (H) 01/18/2017    ASSESSMENT & PLAN:  MGUS (monoclonal gammopathy of unknown significance) 1.IgG kappa MGUS: -Bone marrow aspiration on 09/03/2015 showing 9% plasma cells and negative skeletal survey.  Last M spike of 1.3 g/dL. - History of nephrotic range proteinuria, resolved now but as per patient - Creatinine slightly elevated at 1.6 today.  SPEP and free light chain assay are pending from today.  Calcium is within normal limits.  We will follow-up on those results.  We will continue to watch her closely with 20-month follow-up  with repeat labs.  2.  Iron deficiency state: - Last ferritin was 58 in January.  Ferritin and iron panel from today are pending.  I have recommended Feraheme infusions as she is feeling very tired.  We will aim for a ferritin of 300-400 and percent saturation of about 30%.  3.  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  She is on Eliquis indefinitely and tolerating it well.  4. fibromyalgia: -She is on tramadol, Cymbalta and Lyrica.  Takes Tylenol as needed.     Orders Placed This Encounter  Procedures  . CBC with Differential    Standing Status:   Future    Standing Expiration Date:   06/21/2018  . Comprehensive metabolic panel    Standing Status:   Future    Standing Expiration Date:   06/21/2018  . Ferritin    Standing Status:   Future    Standing Expiration Date:   06/21/2018  . Iron and TIBC    Standing Status:   Future    Standing Expiration Date:   06/21/2018  . Kappa/lambda light chains    Standing Status:   Future    Standing Expiration Date:   06/21/2018  . Protein electrophoresis, serum    Standing Status:   Future    Standing Expiration Date:   06/21/2018   The patient has a good understanding of the overall plan. she agrees with it. she will call with any problems that may develop before the next visit here.   Derek Jack, MD 06/21/17

## 2017-06-21 NOTE — Assessment & Plan Note (Addendum)
1.IgG kappa MGUS: -Bone marrow aspiration on 09/03/2015 showing 9% plasma cells and negative skeletal survey.  Last M spike of 1.3 g/dL. - History of nephrotic range proteinuria, resolved now but as per patient - Creatinine slightly elevated at 1.6 today.  SPEP and free light chain assay are pending from today.  Calcium is within normal limits.  We will follow-up on those results.  We will continue to watch her closely with 17-month follow-up with repeat labs.  2.  Iron deficiency state: - Last ferritin was 58 in January.  Ferritin and iron panel from today are pending.  I have recommended Feraheme infusions as she is feeling very tired.  We will aim for a ferritin of 300-400 and percent saturation of about 30%.  3.  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  She is on Eliquis indefinitely and tolerating it well.  4. fibromyalgia: -She is on tramadol, Cymbalta and Lyrica.  Takes Tylenol as needed.

## 2017-06-22 LAB — FERRITIN: Ferritin: 130 ng/mL (ref 11–307)

## 2017-06-22 LAB — IRON AND TIBC
Iron: 76 ug/dL (ref 28–170)
Saturation Ratios: 25 % (ref 10.4–31.8)
TIBC: 309 ug/dL (ref 250–450)
UIBC: 233 ug/dL

## 2017-06-22 LAB — PTH, INTACT AND CALCIUM
Calcium, Total (PTH): 9.1 mg/dL (ref 8.7–10.3)
PTH: 54 pg/mL (ref 15–65)

## 2017-06-22 LAB — VITAMIN D 25 HYDROXY (VIT D DEFICIENCY, FRACTURES): VIT D 25 HYDROXY: 37.1 ng/mL (ref 30.0–100.0)

## 2017-06-23 LAB — MULTIPLE MYELOMA PANEL, SERUM
ALBUMIN/GLOB SERPL: 0.8 (ref 0.7–1.7)
Albumin SerPl Elph-Mcnc: 3.1 g/dL (ref 2.9–4.4)
Alpha 1: 0.3 g/dL (ref 0.0–0.4)
Alpha2 Glob SerPl Elph-Mcnc: 0.9 g/dL (ref 0.4–1.0)
B-Globulin SerPl Elph-Mcnc: 1.2 g/dL (ref 0.7–1.3)
Gamma Glob SerPl Elph-Mcnc: 1.9 g/dL — ABNORMAL HIGH (ref 0.4–1.8)
Globulin, Total: 4.4 g/dL — ABNORMAL HIGH (ref 2.2–3.9)
IGA: 176 mg/dL (ref 87–352)
IgG (Immunoglobin G), Serum: 2422 mg/dL — ABNORMAL HIGH (ref 700–1600)
IgM (Immunoglobulin M), Srm: 35 mg/dL (ref 26–217)
M Protein SerPl Elph-Mcnc: 1.3 g/dL — ABNORMAL HIGH
Total Protein ELP: 7.5 g/dL (ref 6.0–8.5)

## 2017-06-25 ENCOUNTER — Other Ambulatory Visit: Payer: Self-pay

## 2017-06-25 ENCOUNTER — Inpatient Hospital Stay (HOSPITAL_COMMUNITY): Payer: Medicare Other

## 2017-06-25 ENCOUNTER — Encounter (HOSPITAL_COMMUNITY): Payer: Self-pay

## 2017-06-25 VITALS — BP 116/51 | HR 71 | Temp 98.3°F | Resp 18

## 2017-06-25 DIAGNOSIS — Z7901 Long term (current) use of anticoagulants: Secondary | ICD-10-CM | POA: Diagnosis not present

## 2017-06-25 DIAGNOSIS — D649 Anemia, unspecified: Secondary | ICD-10-CM

## 2017-06-25 DIAGNOSIS — D509 Iron deficiency anemia, unspecified: Secondary | ICD-10-CM | POA: Diagnosis not present

## 2017-06-25 DIAGNOSIS — Z79899 Other long term (current) drug therapy: Secondary | ICD-10-CM | POA: Diagnosis not present

## 2017-06-25 DIAGNOSIS — M797 Fibromyalgia: Secondary | ICD-10-CM | POA: Diagnosis not present

## 2017-06-25 DIAGNOSIS — D472 Monoclonal gammopathy: Secondary | ICD-10-CM | POA: Diagnosis not present

## 2017-06-25 DIAGNOSIS — Z86718 Personal history of other venous thrombosis and embolism: Secondary | ICD-10-CM | POA: Diagnosis not present

## 2017-06-25 MED ORDER — SODIUM CHLORIDE 0.9 % IV SOLN
510.0000 mg | Freq: Once | INTRAVENOUS | Status: AC
  Administered 2017-06-25: 510 mg via INTRAVENOUS
  Filled 2017-06-25: qty 17

## 2017-06-25 MED ORDER — SODIUM CHLORIDE 0.9 % IV SOLN
Freq: Once | INTRAVENOUS | Status: AC
  Administered 2017-06-25: 14:00:00 via INTRAVENOUS

## 2017-07-02 ENCOUNTER — Ambulatory Visit (HOSPITAL_COMMUNITY): Payer: Medicare Other

## 2017-07-08 ENCOUNTER — Other Ambulatory Visit (HOSPITAL_COMMUNITY)
Admission: RE | Admit: 2017-07-08 | Discharge: 2017-07-08 | Disposition: A | Discharge status: Home / Self Care | Payer: Medicare Other | Source: Ambulatory Visit | Attending: Nephrology | Admitting: Nephrology

## 2017-07-08 DIAGNOSIS — E559 Vitamin D deficiency, unspecified: Secondary | ICD-10-CM | POA: Insufficient documentation

## 2017-07-08 DIAGNOSIS — Z79899 Other long term (current) drug therapy: Secondary | ICD-10-CM | POA: Insufficient documentation

## 2017-07-08 DIAGNOSIS — D509 Iron deficiency anemia, unspecified: Secondary | ICD-10-CM | POA: Insufficient documentation

## 2017-07-08 DIAGNOSIS — N183 Chronic kidney disease, stage 3 (moderate): Secondary | ICD-10-CM | POA: Diagnosis not present

## 2017-07-08 DIAGNOSIS — I10 Essential (primary) hypertension: Secondary | ICD-10-CM | POA: Diagnosis not present

## 2017-07-08 LAB — PROTEIN / CREATININE RATIO, URINE
CREATININE, URINE: 64.58 mg/dL
Protein Creatinine Ratio: 0.03 mg/mg{Cre} (ref 0.00–0.15)
TOTAL PROTEIN, URINE: 2 mg/dL

## 2017-07-09 DIAGNOSIS — J449 Chronic obstructive pulmonary disease, unspecified: Secondary | ICD-10-CM | POA: Diagnosis not present

## 2017-07-16 ENCOUNTER — Inpatient Hospital Stay (HOSPITAL_COMMUNITY): Payer: Medicare Other

## 2017-07-16 ENCOUNTER — Other Ambulatory Visit: Payer: Self-pay

## 2017-07-16 ENCOUNTER — Encounter (HOSPITAL_COMMUNITY): Payer: Self-pay

## 2017-07-16 VITALS — BP 112/56 | HR 87 | Temp 98.3°F | Resp 18

## 2017-07-16 DIAGNOSIS — Z86718 Personal history of other venous thrombosis and embolism: Secondary | ICD-10-CM | POA: Diagnosis not present

## 2017-07-16 DIAGNOSIS — Z7901 Long term (current) use of anticoagulants: Secondary | ICD-10-CM | POA: Diagnosis not present

## 2017-07-16 DIAGNOSIS — M797 Fibromyalgia: Secondary | ICD-10-CM | POA: Diagnosis not present

## 2017-07-16 DIAGNOSIS — Z79899 Other long term (current) drug therapy: Secondary | ICD-10-CM | POA: Diagnosis not present

## 2017-07-16 DIAGNOSIS — D472 Monoclonal gammopathy: Secondary | ICD-10-CM | POA: Diagnosis not present

## 2017-07-16 DIAGNOSIS — D649 Anemia, unspecified: Secondary | ICD-10-CM

## 2017-07-16 DIAGNOSIS — D509 Iron deficiency anemia, unspecified: Secondary | ICD-10-CM | POA: Diagnosis not present

## 2017-07-16 MED ORDER — SODIUM CHLORIDE 0.9 % IV SOLN
Freq: Once | INTRAVENOUS | Status: AC
Start: 2017-07-16 — End: 2017-07-16
  Administered 2017-07-16: 14:00:00 via INTRAVENOUS

## 2017-07-16 MED ORDER — FERUMOXYTOL INJECTION 510 MG/17 ML
510.0000 mg | Freq: Once | INTRAVENOUS | Status: AC
  Administered 2017-07-16: 510 mg via INTRAVENOUS
  Filled 2017-07-16: qty 17

## 2017-07-16 NOTE — Progress Notes (Unsigned)
Tolerated infusion w/o adverse reaction.  Alert, in no distress.  VSS.  Discharged ambulatory in c/o family.  

## 2017-07-26 ENCOUNTER — Other Ambulatory Visit (HOSPITAL_COMMUNITY)
Admission: RE | Admit: 2017-07-26 | Discharge: 2017-07-26 | Disposition: A | Discharge status: Home / Self Care | Payer: Medicare Other | Source: Ambulatory Visit | Attending: Family Medicine | Admitting: Family Medicine

## 2017-07-26 DIAGNOSIS — E118 Type 2 diabetes mellitus with unspecified complications: Secondary | ICD-10-CM | POA: Diagnosis not present

## 2017-07-26 LAB — LIPID PANEL
CHOL/HDL RATIO: 3.7 ratio
CHOLESTEROL: 178 mg/dL (ref 0–200)
HDL: 48 mg/dL (ref >40)
LDL Cholesterol: 90 mg/dL (ref 0–99)
TRIGLYCERIDES: 198 mg/dL — AB (ref <150)
VLDL: 40 mg/dL (ref 0–40)

## 2017-07-26 LAB — HEMOGLOBIN A1C
HEMOGLOBIN A1C: 7.7 % — AB (ref 4.8–5.6)
MEAN PLASMA GLUCOSE: 174.29 mg/dL

## 2017-07-26 NOTE — H&P (Signed)
Stephanie [name] is an 67 y.o. female.    Chief Complaint: right shoulder pain  HPI: Pt is a 67 y.o. female complaining of right shoulder pain for multiple years. Pain had continually increased since the beginning. X-rays in the clinic show end-stage arthritic changes of the right shoulder. Pt has tried various conservative treatments which have failed to alleviate their symptoms, including injections and therapy. Various options are discussed with the patient. Risks, benefits and expectations were discussed with the patient. Patient understand the risks, benefits and expectations and wishes to proceed with surgery.   PCP:  Jani Gravel, MD  D/C Plans: Home  PMH: Past Medical History:  Diagnosis Date  . Anemia   . Arthritis   . Asthma   . COPD (chronic obstructive pulmonary disease) ([place])   . Deep vein thrombosis (DVT) of both lower extremities ([place]) 06/27/2015  . Diabetes mellitus   . Fibromyalgia   . GERD (gastroesophageal reflux disease)   . H/O hiatal hernia   . Hypercholesteremia   . Hypertension   . Hyperthyroidism   . IBS (irritable bowel syndrome)   . Inner ear disease   . 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 PONV (postoperative nausea and vomiting)   . Tachycardia     PSH: Past Surgical History:  Procedure Laterality Date  . ABDOMINAL HYSTERECTOMY  partial  . CARPAL TUNNEL RELEASE Right 1991  . CATARACT EXTRACTION W/PHACO Right 05/08/2013   Procedure: CATARACT EXTRACTION PHACO AND INTRAOCULAR LENS PLACEMENT (IOC);  Surgeon: Tonny Branch, MD;  Location: [facility];  Service: Ophthalmology;  Laterality: Right;  CDE 10.31  . CATARACT EXTRACTION W/PHACO Left 08/17/2013   Procedure: CATARACT EXTRACTION PHACO AND INTRAOCULAR LENS PLACEMENT (IOC);  Surgeon: Tonny Branch, MD;  Location: [facility];  Service: Ophthalmology;  Laterality: Left;  CDE:9.03  . CHOLECYSTECTOMY    . COLONOSCOPY WITH PROPOFOL N/A 01/06/2016   Dr. Gala Romney: diverticulosis   . DENTAL SURGERY     . ESOPHAGOGASTRODUODENOSCOPY (EGD) WITH PROPOFOL N/A 01/06/2016   Dr. Gala Romney: normal s/p empiric dilation   . MALONEY DILATION N/A 01/06/2016   Procedure: Venia Minks DILATION;  Surgeon: Daneil Dolin, MD;  Location: [facility];  Service: Endoscopy;  Laterality: N/A;  . WRIST GANGLION EXCISION Left     Social History:  reports that she quit smoking about 3 years ago. Her smoking use included cigarettes. She has a 7.50 pack-year smoking history. She has never used smokeless tobacco. She reports that she does not drink alcohol or use drugs.  Allergies:  Allergies  Allergen Reactions  . Tetracyclines & Related Anaphylaxis  . Banana Hives and Nausea And Vomiting  . Penicillins Rash    Has patient had a PCN reaction causing immediate rash, facial/tongue/throat swelling, SOB or lightheadedness with hypotension: No Has patient had a PCN reaction causing severe rash involving mucus membranes or skin necrosis: No Has patient had a PCN reaction that required hospitalization No Has patient had a PCN reaction occurring within the last 10 years: No If all of the above answers are "NO", then may proceed with Cephalosporin use.     Medications: No current facility-administered medications for this encounter.    Current Outpatient Medications  Medication Sig Dispense Refill  . ACCU-CHEK AVIVA PLUS test strip   7  . acetaminophen (TYLENOL) 500 MG tablet Take 500 mg by mouth every 6 (six) hours as needed.    Marland Kitchen albuterol (PROVENTIL HFA;VENTOLIN HFA) 108 (90 BASE) MCG/ACT inhaler Inhale 2 puffs into  the lungs every 6 (six) hours as needed for wheezing or shortness of breath.    Marland Kitchen albuterol (PROVENTIL) (2.5 MG/3ML) 0.083% nebulizer solution Take 2.5 mg by nebulization every 6 (six) hours as needed for wheezing or shortness of breath.    Marland Kitchen apixaban (ELIQUIS) 5 MG TABS tablet Take 10 mg by mouth BID through Friday 5/19 evening. Take 5mg  by mouth BID starting 5/20 AM. (Patient taking differently: Take 5  mg by mouth 2 (two) times daily. ) 60 tablet 3  . ARNUITY ELLIPTA 100 MCG/ACT AEPB   11  . Benzonatate (TESSALON PERLES PO) Take 1-2 capsules by mouth as needed.     . cetirizine (ZYRTEC) 10 MG tablet Take 10 mg by mouth daily.    . cholecalciferol (VITAMIN D) 1000 units tablet Take 2,000 Units by mouth daily.    Marland Kitchen CINNAMON PO Take 2-4 capsules by mouth 2 (two) times daily as needed (high blood sugar).     . clobetasol ointment (TEMOVATE) 6.71 % Apply 1 application topically 2 (two) times daily as needed (rash).     Marland Kitchen Cod Liver Oil CAPS Take 1 capsule by mouth daily.    . Cyanocobalamin (VITAMIN B-12 PO) Take 1 tablet by mouth daily.      Marland Kitchen dexlansoprazole (DEXILANT) 60 MG capsule Take 1 capsule (60 mg total) by mouth daily. 30 capsule 5  . diphenhydrAMINE (BENADRYL) 25 MG tablet Take 25 mg by mouth at bedtime as needed.    . Doxepin HCl 5 % CREA apply 2 grams (2 grams=2 inches) to affected area(s) 3 times daily. Takes as needed  5  . Dulaglutide (TRULICITY) 1.5 IW/5.8KD SOPN Inject 1.5 mg into the skin once a week.    . DULoxetine (CYMBALTA) 30 MG capsule Take 30 mg by mouth 2 (two) times daily.     . fluticasone (VERAMYST) 27.5 MCG/SPRAY nasal spray Place 2 sprays into the nose as needed.    Marland Kitchen HUMALOG KWIKPEN 100 UNIT/ML KiwkPen Inject 30 Units into the skin 3 (three) times daily. Hold if blood sugar is 130 or lower    . hydrOXYzine (ATARAX/VISTARIL) 25 MG tablet Take 25 mg by mouth every 8 (eight) hours as needed for itching.     . Insulin Glargine (LANTUS SOLOSTAR) 100 UNIT/ML Solostar Pen Inject 75 Units into the skin 2 (two) times daily. 75 units in am, 80 units at night    . levothyroxine (SYNTHROID, LEVOTHROID) 100 MCG tablet Take 100 mcg by mouth daily before breakfast.   0  . lidocaine (XYLOCAINE) 5 % ointment APPLY 2-3 GRAMS (1 GRAM=1 INCH) TO AFFECTED AREA 4 TIMES DAILY  5  . lisinopril (PRINIVIL,ZESTRIL) 2.5 MG tablet Take 2.5 mg by mouth daily.    Marland Kitchen lubiprostone (AMITIZA) 8 MCG  capsule Take 1 capsule (8 mcg total) by mouth 2 (two) times daily with a meal. 60 capsule 11  . LYRICA 75 MG capsule Take 75 mg by mouth 2 (two) times daily.     . Melatonin 5 MG CAPS Take 5-10 mg by mouth at bedtime.     . metaxalone (SKELAXIN) 800 MG tablet Take 800 mg by mouth 3 (three) times daily.    . metolazone (ZAROXOLYN) 5 MG tablet Take 1 tablet (5 mg total) by mouth 2 (two) times daily. 60 tablet 0  . metoprolol tartrate (LOPRESSOR) 100 MG tablet     . metoprolol tartrate (LOPRESSOR) 25 MG tablet     . mometasone (ELOCON) 0.1 % cream Apply 1 application topically  daily as needed (rash).     . NONFORMULARY OR COMPOUNDED ITEM APPLY 1 GRAM ( 1 GRAM = 1 PUMP) TO THE AFFECTED AREA 4 TIMES A DAY  5  . NOVOFINE 32G X 6 MM MISC   6  . pantoprazole (PROTONIX) 40 MG tablet   12  . pravastatin (PRAVACHOL) 40 MG tablet Take 60 mg by mouth every evening.     Marland Kitchen PROAIR RESPICLICK 211 (90 Base) MCG/ACT AEPB     . tiZANidine (ZANAFLEX) 2 MG tablet Take 2 mg by mouth every 6 (six) hours as needed for muscle spasms.    Marland Kitchen tiZANidine (ZANAFLEX) 4 MG tablet     . torsemide (DEMADEX) 20 MG tablet Take 2 tablets (40 mg total) by mouth daily. (Patient taking differently: Take 20 mg by mouth daily. May take an additional  20mg s as needed for swelling) 60 tablet 0  . traMADol (ULTRAM) 50 MG tablet Take 1 tablet (50 mg total) by mouth every 6 (six) hours as needed. (Patient taking differently: Take 50 mg by mouth every 8 (eight) hours as needed. ) 16 tablet 0  . vitamin C (ASCORBIC ACID) 500 MG tablet Take 1,000 mg by mouth daily.    . vitamin E 400 UNIT capsule Take 400 Units by mouth daily.      No results found for this or any previous visit (from the past 48 hour(s)). No results found.  ROS: Pain with rom of the right upper extremity  Physical Exam:  Alert and oriented 67 y.o. female in no acute distress Cranial nerves 2-12 intact Cervical spine: full rom with no tenderness, nv intact  distally Chest: active breath sounds bilaterally, no wheeze rhonchi or rales Heart: regular rate and rhythm, no murmur Abd: non tender non distended with active bowel sounds Hip is stable with rom  Right shoulder with limited rom and strength nv intact distally No rashes or edema to upper extremity  Assessment/Plan Assessment: right shoulder cuff arthropathy  Plan: Patient will undergo a right reverse total shoulder by Dr. Veverly Fells at [facility]. Risks benefits and expectations were discussed with the patient. Patient understand risks, benefits and expectations and wishes to proceed.  Merla Riches PA-C, [facility] is now [employer] [address] [place], [place], [place] [address] Phone: [phone] [email or web address]  [employer]

## 2017-07-29 DIAGNOSIS — M545 Low back pain: Secondary | ICD-10-CM | POA: Diagnosis not present

## 2017-07-29 DIAGNOSIS — Z79899 Other long term (current) drug therapy: Secondary | ICD-10-CM | POA: Diagnosis not present

## 2017-07-29 DIAGNOSIS — M25511 Pain in right shoulder: Secondary | ICD-10-CM | POA: Diagnosis not present

## 2017-07-29 DIAGNOSIS — G894 Chronic pain syndrome: Secondary | ICD-10-CM | POA: Diagnosis not present

## 2017-07-29 DIAGNOSIS — Z79891 Long term (current) use of opiate analgesic: Secondary | ICD-10-CM | POA: Diagnosis not present

## 2017-07-30 NOTE — Pre-Procedure Instructions (Signed)
Stephanie [name]  07/30/2017      [place], [place] - [id number] [place]  [address] Phone: [phone] Fax: [phone]  [place], [place] [address] [place] [address] Phone: [phone] Fax: [phone]    Your procedure is scheduled on Friday, June 21st   Report to [facility] Admitting at 5:30 AM                   (posted surgery time 7:30a - 9:00a)              Call this number if you have problems the morning of surgery:  [phone]   Remember:   Do not eat food or drink any liquids after 12 midnight, Wednesday.                           4-5 days prior to surgery, STOP TAKING any Vitamins, Herbal Supplements, Anti-inflammatories.   Take these medicines the morning of surgery with A SIP OF WATER : Dexilant, Cymbalta, Levothyroxine, Metoprolol, Lyrica, Tramadol, Zanaflex.  Please use your nebulizer / inhalers that morning.    Do not wear jewelry, make-up or nail polish.  Do not wear lotions, powders,  perfumes, or deodorant.  Do not shave 48 hours prior to surgery.   Do not bring valuables to the hospital.  [facility] is not responsible for any belongings or valuables.  Contacts, dentures or bridgework may not be worn into surgery.  Leave your suitcase in the car.  After surgery it may be brought to your room.  For patients admitted to the hospital, discharge time will be determined by your treatment team.  Please read over the following fact sheets that you were given. Pain Booklet, MRSA Information and Surgical Site Infection Prevention         How to Manage Your Diabetes Before and After Surgery  Why is it important to control my blood sugar before and after surgery? . Improving blood sugar levels before and after surgery helps healing and can limit problems. . A way of improving blood sugar control is eating a healthy diet by: o   Eating less sugar and carbohydrates o  Increasing activity/exercise o  Talking with your doctor about reaching your blood sugar goals  . High blood sugars (greater than 180 mg/dL) can raise your risk of infections and slow your recovery, so you will need to focus on controlling your diabetes during the weeks before surgery. .  . Make sure that the doctor who takes care of your diabetes knows about your planned surgery including the date and location.  How do I manage my blood sugar before surgery? . Check your blood sugar at least 4 times a day, starting 2 days before surgery, to make sure that the level is not too high or low. o Check your blood sugar the morning of your surgery when you wake up and every 2 hours until you get to the Short Stay unit. o  . If your blood sugar is less than 70 mg/dL, you will need to treat for low blood sugar: o Do not take insulin. o Treat a low blood sugar (less than 70 mg/dL) with  cup of clear juice (cranberry or apple), 4 glucose tablets, OR glucose gel. o NO ORANGE JUICE o  Recheck blood sugar in 15  minutes after treatment (to make sure it is greater than 70 mg/dL). If your blood sugar is not greater than 70 mg/dL on recheck, call [phone] o  for further instructions. . Report your blood sugar to the short stay nurse when you get to Short Stay.  . If you are admitted to the hospital after surgery: o Your blood sugar will be checked by the staff and you will probably be given insulin after surgery (instead of oral diabetes medicines) to make sure you have good blood sugar levels. o  o The goal for blood sugar control after surgery is 80-180 mg/dL.   WHAT DO I DO ABOUT MY DIABETES MEDICATION?   Marland Kitchen Do not take oral diabetes medicines (pills) the morning of surgery.  . THE NIGHT BEFORE SURGERY, take   40  units of _Lantus (Glargine)__insulin.       . THE MORNING OF SURGERY, take   37  units of _Lantus (Glargine)__insulin.  . The day of  surgery, do not take other diabetes injectables, including Byetta (exenatide), Bydureon (exenatide ER), Victoza (liraglutide), or Trulicity (dulaglutide).  . If your CBG is greater than 220 mg/dL, you may take  of your sliding scale (correction) dose of insulin    [facility]- Preparing For Surgery  Before surgery, you can play an important role. Because skin is not sterile, your skin needs to be as free of germs as possible. You can reduce the number of germs on your skin by washing with CHG (chlorahexidine gluconate) Soap before surgery.  CHG is an antiseptic cleaner which kills germs and bonds with the skin to continue killing germs even after washing.    Oral Hygiene is also important to reduce your risk of infection.    Remember - BRUSH YOUR TEETH THE MORNING OF SURGERY WITH YOUR REGULAR TOOTHPASTE  Please do not use if you have an allergy to CHG or antibacterial soaps. If your skin becomes reddened/irritated stop using the CHG.  Do not shave (including legs and underarms) for at least 48 hours prior to first CHG shower. It is OK to shave your face.  Please follow these instructions carefully.   1. Shower the NIGHT BEFORE SURGERY and the MORNING OF SURGERY with CHG.   2. If you chose to wash your hair, wash your hair first as usual with your normal shampoo.  3. After you shampoo, rinse your hair and body thoroughly to remove the shampoo.  4. Use CHG as you would any other liquid soap. You can apply CHG directly to the skin and wash gently with a scrungie or a clean washcloth.   5. Apply the CHG Soap to your body ONLY FROM THE NECK DOWN.  Do not use on open wounds or open sores. Avoid contact with your eyes, ears, mouth and genitals (private parts). Wash Face and genitals (private parts)  with your normal soap.  6. Wash thoroughly, paying special attention to the area where your surgery will be performed.  7. Thoroughly rinse your body with warm water from the neck down.  8. DO  NOT shower/wash with your normal soap after using and rinsing off the CHG Soap.  9. Pat yourself dry with a CLEAN TOWEL.  10. Wear CLEAN PAJAMAS to bed the night before surgery, wear comfortable clothes the morning of surgery  11. Place CLEAN SHEETS on your bed the night of your first shower and DO NOT SLEEP WITH PETS.    Day of Surgery:  Do not apply any deodorants/lotions.  Please wear clean clothes to the hospital/surgery center.    Remember to brush your teeth WITH YOUR REGULAR TOOTHPASTE.

## 2017-08-02 ENCOUNTER — Encounter (HOSPITAL_COMMUNITY): Payer: Self-pay

## 2017-08-02 ENCOUNTER — Other Ambulatory Visit: Payer: Self-pay

## 2017-08-02 ENCOUNTER — Encounter (HOSPITAL_COMMUNITY)
Admission: RE | Admit: 2017-08-02 | Discharge: 2017-08-02 | Disposition: A | Discharge status: Home / Self Care | Payer: Medicare Other | Source: Ambulatory Visit | Attending: Orthopedic Surgery | Admitting: Orthopedic Surgery

## 2017-08-02 DIAGNOSIS — M75101 Unspecified rotator cuff tear or rupture of right shoulder, not specified as traumatic: Secondary | ICD-10-CM | POA: Insufficient documentation

## 2017-08-02 DIAGNOSIS — D509 Iron deficiency anemia, unspecified: Secondary | ICD-10-CM | POA: Insufficient documentation

## 2017-08-02 DIAGNOSIS — E119 Type 2 diabetes mellitus without complications: Secondary | ICD-10-CM | POA: Insufficient documentation

## 2017-08-02 DIAGNOSIS — I10 Essential (primary) hypertension: Secondary | ICD-10-CM | POA: Diagnosis not present

## 2017-08-02 DIAGNOSIS — M19011 Primary osteoarthritis, right shoulder: Secondary | ICD-10-CM | POA: Diagnosis not present

## 2017-08-02 DIAGNOSIS — Z01812 Encounter for preprocedural laboratory examination: Secondary | ICD-10-CM | POA: Insufficient documentation

## 2017-08-02 DIAGNOSIS — Z86718 Personal history of other venous thrombosis and embolism: Secondary | ICD-10-CM | POA: Diagnosis not present

## 2017-08-02 LAB — BASIC METABOLIC PANEL
Anion gap: 12 (ref 5–15)
BUN: 34 mg/dL — AB (ref 6–20)
CO2: 26 mmol/L (ref 22–32)
Calcium: 9.2 mg/dL (ref 8.9–10.3)
Chloride: 97 mmol/L — ABNORMAL LOW (ref 101–111)
Creatinine, Ser: 1.57 mg/dL — ABNORMAL HIGH (ref 0.44–1.00)
GFR calc Af Amer: 39 mL/min — ABNORMAL LOW (ref >60)
GFR, EST NON AFRICAN AMERICAN: 33 mL/min — AB (ref >60)
GLUCOSE: 126 mg/dL — AB (ref 65–99)
Potassium: 3.5 mmol/L (ref 3.5–5.1)
SODIUM: 135 mmol/L (ref 135–145)

## 2017-08-02 LAB — CBC
HCT: 39.3 % (ref 36.0–46.0)
Hemoglobin: 12.2 g/dL (ref 12.0–15.0)
MCH: 23.8 pg — AB (ref 26.0–34.0)
MCHC: 31 g/dL (ref 30.0–36.0)
MCV: 76.8 fL — AB (ref 78.0–100.0)
PLATELETS: 237 10*3/uL (ref 150–400)
RBC: 5.12 MIL/uL — AB (ref 3.87–5.11)
RDW: 17.4 % — AB (ref 11.5–15.5)
WBC: 14.2 10*3/uL — AB (ref 4.0–10.5)

## 2017-08-02 LAB — GLUCOSE, CAPILLARY: GLUCOSE-CAPILLARY: 142 mg/dL — AB (ref 65–99)

## 2017-08-02 LAB — SURGICAL PCR SCREEN
MRSA, PCR: NEGATIVE
STAPHYLOCOCCUS AUREUS: NEGATIVE

## 2017-08-02 NOTE — Progress Notes (Signed)
PCP is Dr. Georges Mouse  LOV 07/29/2017  H/o DVT in 2015    Echo 2015                                                                                                             He also regulates her diabetes and writes out scripts for Eliquis Ekg done at the office 05/2017 Cardio is Dr. Natividad Brood 2017 Nephrology is Dr. Francene Finders (at one time she was putting out to much protein in her urine.  Will be seeing him 08/10/2017 Clearance note inside chart.

## 2017-08-03 NOTE — Progress Notes (Signed)
Anesthesia Chart Review:   Case:  [id number] Date/Time:  08/06/17 0715   Procedure:  RIGHT REVERSE SHOULDER ARTHROPLASTY (Right Shoulder)   Anesthesia type:  General   Pre-op diagnosis:  Right shoulder rotator cuff tear, osteoarthritis   Location:  [facility] OR ROOM 04 / [facility]   Surgeon:  Netta Cedars, MD      DISCUSSION: - Pt is a 67 year old female with hx tachycardia, HTN, DM, MGUS, iron deficiency anemia, bilateral DVT (on indefinite anticoagulation)  - Pt has medical clearance from [name], NP in PCP's office.   - Reviewed EKG with Dr. Ermalene Postin.    VS: BP (!) 142/74   Pulse 100   Temp 36.9 C   Resp 18   Ht 5\' 7"  (1.702 m)   Wt 231 lb 1.6 oz (104.8 kg)   SpO2 100%   BMI 36.20 kg/m    PROVIDERS:  PCP is Jani Gravel, MD. Last office visit 05/17/17 with Anda Kraft, NP who cleared pt for surgery Used to see cardiologist Kate Sable, MD for inappropriate sinus tachycardiac, controlled by metoprolol. Last office visit 11/04/15; 1 year f/u recommended Nephrologist is Everett Graff, MD Hematologist is Derek Jack, MD   LABS: Labs reviewed: Acceptable for surgery. (all labs ordered are listed, but only abnormal results are displayed)  Labs Reviewed  GLUCOSE, CAPILLARY - Abnormal; Notable for the following components:      Result Value   Glucose-Capillary 142 (*)    All other components within normal limits  CBC - Abnormal; Notable for the following components:   WBC 14.2 (*)    RBC 5.12 (*)    MCV 76.8 (*)    MCH 23.8 (*)    RDW 17.4 (*)    All other components within normal limits  BASIC METABOLIC PANEL - Abnormal; Notable for the following components:   Chloride 97 (*)    Glucose, Bld 126 (*)    BUN 34 (*)    Creatinine, Ser 1.57 (*)    GFR calc non Af Amer 33 (*)    GFR calc Af Amer 39 (*)    All other components within normal limits  SURGICAL PCR SCREEN     IMAGES:   EKG 05/21/17: Sinus rhythm with Fusion complexes. Inferior infarct, age  undetermined. Anterolateral infarct, age undetermined   CV:  Cardiac event monitor 12/04/13:  - inappropriate sinus tachycardia   Echo 11/13/13:  - Left ventricle: The cavity size was normal. Wall thickness wasnormal. Systolic function was normal. The estimated ejectionfraction was in the range of 55% to 60%. Wall motion was normal;there were no regional wall motion abnormalities. Doppler parameters are consistent with abnormal left ventricularrelaxation (grade 1 diastolic dysfunction). - Aortic valve: Mildly calcified annulus. Trileaflet. There was nosignificant regurgitation. - Mitral valve: Mildly thickened leaflets . There was trivialregurgitation. - Right atrium: Central venous pressure (est): 3 mm Hg. - Atrial septum: No defect or patent foramen ovale was identified. - Tricuspid valve: There was trivial regurgitation. - Pericardium, extracardiac: There was no pericardial effusion. - Impressions: Normal LV chamber size and wall thickness with LVEF 21-30%, grade1 diastolic dysfunction. Trivial mtiral and tricuspdregurgitation. Unable to assess PASP.   Past Medical History:  Diagnosis Date  . Anemia   . Arthritis   . Asthma   . COPD (chronic obstructive pulmonary disease) ([place])   . Deep vein thrombosis (DVT) of both lower extremities ([place]) 06/27/2015  . Diabetes mellitus   . Fibromyalgia   . GERD (gastroesophageal reflux disease)   .  H/O hiatal hernia   . Hypercholesteremia   . Hypertension   . Hyperthyroidism   . IBS (irritable bowel syndrome)   . Inner ear disease   . 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 PONV (postoperative nausea and vomiting)   . Tachycardia     Past Surgical History:  Procedure Laterality Date  . ABDOMINAL HYSTERECTOMY  partial  . CARPAL TUNNEL RELEASE Right 1991  . CATARACT EXTRACTION W/PHACO Right 05/08/2013   Procedure: CATARACT EXTRACTION PHACO AND INTRAOCULAR LENS PLACEMENT (IOC);  Surgeon: Tonny Branch, MD;   Location: [facility];  Service: Ophthalmology;  Laterality: Right;  CDE 10.31  . CATARACT EXTRACTION W/PHACO Left 08/17/2013   Procedure: CATARACT EXTRACTION PHACO AND INTRAOCULAR LENS PLACEMENT (IOC);  Surgeon: Tonny Branch, MD;  Location: [facility];  Service: Ophthalmology;  Laterality: Left;  CDE:9.03  . CHOLECYSTECTOMY    . COLONOSCOPY WITH PROPOFOL N/A 01/06/2016   Dr. Gala Romney: diverticulosis   . DENTAL SURGERY    . ESOPHAGOGASTRODUODENOSCOPY (EGD) WITH PROPOFOL N/A 01/06/2016   Dr. Gala Romney: normal s/p empiric dilation   . EYE SURGERY    . MALONEY DILATION N/A 01/06/2016   Procedure: Venia Minks DILATION;  Surgeon: Daneil Dolin, MD;  Location: [facility];  Service: Endoscopy;  Laterality: N/A;  . WRIST GANGLION EXCISION Left     MEDICATIONS: . acetaminophen (TYLENOL) 500 MG tablet  . albuterol (PROVENTIL HFA;VENTOLIN HFA) 108 (90 BASE) MCG/ACT inhaler  . albuterol (PROVENTIL) (2.5 MG/3ML) 0.083% nebulizer solution  . apixaban (ELIQUIS) 5 MG TABS tablet  . ARNUITY ELLIPTA 100 MCG/ACT AEPB  . Ascorbic Acid (VITAMIN C) 1000 MG tablet  . benzonatate (TESSALON PERLES) 100 MG capsule  . cetirizine (ZYRTEC) 10 MG tablet  . Cholecalciferol (VITAMIN D) 2000 units tablet  . CINNAMON PO  . clobetasol ointment (TEMOVATE) 0.05 %  . Cod Liver Oil CAPS  . dexlansoprazole (DEXILANT) 60 MG capsule  . diphenhydrAMINE (BENADRYL) 25 MG tablet  . Doxepin HCl 5 % CREA  . Dulaglutide (TRULICITY) 1.5 GE/3.6OQ SOPN  . DULoxetine (CYMBALTA) 30 MG capsule  . fluticasone (VERAMYST) 27.5 MCG/SPRAY nasal spray  . HUMALOG KWIKPEN 100 UNIT/ML KiwkPen  . hydrOXYzine (ATARAX/VISTARIL) 25 MG tablet  . Insulin Glargine (LANTUS SOLOSTAR) 100 UNIT/ML Solostar Pen  . levothyroxine (SYNTHROID, LEVOTHROID) 100 MCG tablet  . lidocaine (XYLOCAINE) 5 % ointment  . lisinopril (PRINIVIL,ZESTRIL) 2.5 MG tablet  . lubiprostone (AMITIZA) 8 MCG capsule  . LYRICA 75 MG capsule  . Melatonin 10 MG TABS  . metolazone (ZAROXOLYN) 5 MG  tablet  . metoprolol tartrate (LOPRESSOR) 100 MG tablet  . metoprolol tartrate (LOPRESSOR) 25 MG tablet  . mometasone (ELOCON) 0.1 % cream  . NOVOFINE 32G X 6 MM MISC  . pravastatin (PRAVACHOL) 80 MG tablet  . PROAIR RESPICLICK 947 (90 Base) MCG/ACT AEPB  . tiZANidine (ZANAFLEX) 4 MG tablet  . torsemide (DEMADEX) 20 MG tablet  . traMADol (ULTRAM) 50 MG tablet  . vitamin B-12 (CYANOCOBALAMIN) 1000 MCG tablet  . vitamin E 200 UNIT capsule   No current facility-administered medications for this encounter.     If no changes, I anticipate pt can proceed with surgery as scheduled.   Willeen Cass, FNP-BC [facility]/Anesthesiology Phone: [phone] 08/03/2017 4:07 PM

## 2017-08-04 ENCOUNTER — Other Ambulatory Visit (HOSPITAL_COMMUNITY)
Admission: RE | Admit: 2017-08-04 | Discharge: 2017-08-04 | Disposition: A | Discharge status: Home / Self Care | Payer: Medicare Other | Source: Ambulatory Visit | Attending: Nephrology | Admitting: Nephrology

## 2017-08-04 DIAGNOSIS — M19011 Primary osteoarthritis, right shoulder: Secondary | ICD-10-CM | POA: Diagnosis not present

## 2017-08-04 DIAGNOSIS — D509 Iron deficiency anemia, unspecified: Secondary | ICD-10-CM | POA: Diagnosis not present

## 2017-08-04 DIAGNOSIS — E119 Type 2 diabetes mellitus without complications: Secondary | ICD-10-CM | POA: Diagnosis not present

## 2017-08-04 DIAGNOSIS — Z86718 Personal history of other venous thrombosis and embolism: Secondary | ICD-10-CM | POA: Diagnosis not present

## 2017-08-04 DIAGNOSIS — M75101 Unspecified rotator cuff tear or rupture of right shoulder, not specified as traumatic: Secondary | ICD-10-CM | POA: Diagnosis not present

## 2017-08-04 DIAGNOSIS — I10 Essential (primary) hypertension: Secondary | ICD-10-CM | POA: Diagnosis not present

## 2017-08-04 DIAGNOSIS — Z01812 Encounter for preprocedural laboratory examination: Secondary | ICD-10-CM | POA: Diagnosis not present

## 2017-08-04 LAB — RENAL FUNCTION PANEL
Albumin: 3.4 g/dL — ABNORMAL LOW (ref 3.5–5.0)
Anion gap: 10 (ref 5–15)
BUN: 44 mg/dL — AB (ref 6–20)
CHLORIDE: 98 mmol/L — AB (ref 101–111)
CO2: 30 mmol/L (ref 22–32)
CREATININE: 1.54 mg/dL — AB (ref 0.44–1.00)
Calcium: 8.8 mg/dL — ABNORMAL LOW (ref 8.9–10.3)
GFR calc Af Amer: 39 mL/min — ABNORMAL LOW (ref >60)
GFR, EST NON AFRICAN AMERICAN: 34 mL/min — AB (ref >60)
GLUCOSE: 128 mg/dL — AB (ref 65–99)
POTASSIUM: 3 mmol/L — AB (ref 3.5–5.1)
Phosphorus: 3.9 mg/dL (ref 2.5–4.6)
Sodium: 138 mmol/L (ref 135–145)

## 2017-08-04 LAB — FERRITIN: FERRITIN: 905 ng/mL — AB (ref 11–307)

## 2017-08-04 LAB — HEMOGLOBIN AND HEMATOCRIT, BLOOD
HCT: 35.7 % — ABNORMAL LOW (ref 36.0–46.0)
Hemoglobin: 11.3 g/dL — ABNORMAL LOW (ref 12.0–15.0)

## 2017-08-04 LAB — PROTEIN / CREATININE RATIO, URINE: CREATININE, URINE: 46.78 mg/dL

## 2017-08-04 LAB — IRON AND TIBC
Iron: 84 ug/dL (ref 28–170)
SATURATION RATIOS: 32 % — AB (ref 10.4–31.8)
TIBC: 262 ug/dL (ref 250–450)
UIBC: 178 ug/dL

## 2017-08-05 DIAGNOSIS — R531 Weakness: Secondary | ICD-10-CM | POA: Diagnosis not present

## 2017-08-05 DIAGNOSIS — G894 Chronic pain syndrome: Secondary | ICD-10-CM | POA: Diagnosis not present

## 2017-08-05 DIAGNOSIS — M545 Low back pain: Secondary | ICD-10-CM | POA: Diagnosis not present

## 2017-08-05 LAB — PTH, INTACT AND CALCIUM
Calcium, Total (PTH): 8.9 mg/dL (ref 8.7–10.3)
PTH: 36 pg/mL (ref 15–65)

## 2017-08-05 LAB — VITAMIN D 25 HYDROXY (VIT D DEFICIENCY, FRACTURES): Vit D, 25-Hydroxy: 33.9 ng/mL (ref 30.0–100.0)

## 2017-08-05 NOTE — Anesthesia Preprocedure Evaluation (Addendum)
Anesthesia Evaluation  Patient identified by MRN, date of birth, ID band Patient awake    Reviewed: Allergy & Precautions, H&P , NPO status , Patient's Chart, lab work & pertinent test results, reviewed documented beta blocker date and time   History of Anesthesia Complications (+) PONV  Airway Mallampati: II  TM Distance: >3 FB Neck ROM: Limited    Dental  (+) Edentulous Upper, Partial Lower   Pulmonary asthma , COPD,  COPD inhaler, former smoker,    breath sounds clear to auscultation       Cardiovascular hypertension, Pt. on medications and Pt. on home beta blockers + Peripheral Vascular Disease and + DVT  + dysrhythmias (hx tachycardia)  Rhythm:Regular Rate:Normal  EKG - SR, inferior and anterolateral Q waves   Neuro/Psych    GI/Hepatic hiatal hernia, GERD  Controlled,IBS   Endo/Other  diabetes, Type 2, Oral Hypoglycemic Agents, Insulin DependentHypothyroidism Obesity  Renal/GU      Musculoskeletal  (+) Arthritis , Fibromyalgia -  Abdominal (+) + obese,   Peds  Hematology  (+) anemia , MGUS   Anesthesia Other Findings   Reproductive/Obstetrics                            Anesthesia Physical Anesthesia Plan  ASA: III  Anesthesia Plan: General   Post-op Pain Management:  Regional for Post-op pain   Induction: Intravenous  PONV Risk Score and Plan: Treatment may vary due to age or medical condition, Ondansetron, Midazolam, Diphenhydramine and Propofol infusion  Airway Management Planned: Oral ETT  Additional Equipment: None  Intra-op Plan:   Post-operative Plan: Extubation in OR  Informed Consent: I have reviewed the patients History and Physical, chart, labs and discussed the procedure including the risks, benefits and alternatives for the proposed anesthesia with the patient or authorized representative who has indicated his/her understanding and acceptance.   Dental  advisory given  Plan Discussed with: CRNA and Anesthesiologist  Anesthesia Plan Comments: (Will run background propofol infusion, turn off volatile towards end of case and transition to nitrous and propofol for wakeup given hx of PONV)        Anesthesia Quick Evaluation

## 2017-08-06 ENCOUNTER — Inpatient Hospital Stay (HOSPITAL_COMMUNITY): Payer: Medicare Other | Admitting: Vascular Surgery

## 2017-08-06 ENCOUNTER — Encounter (HOSPITAL_COMMUNITY)
Admission: RE | Disposition: A | Discharge status: Home / Self Care | Payer: Self-pay | Source: Ambulatory Visit | Attending: Orthopedic Surgery

## 2017-08-06 ENCOUNTER — Inpatient Hospital Stay (HOSPITAL_COMMUNITY): Payer: Medicare Other | Admitting: Anesthesiology

## 2017-08-06 ENCOUNTER — Inpatient Hospital Stay (HOSPITAL_COMMUNITY)
Admission: RE | Admit: 2017-08-06 | Discharge: 2017-08-09 | DRG: 483 | Disposition: A | Discharge status: Home / Self Care | Payer: Medicare Other | Source: Ambulatory Visit | Attending: Orthopedic Surgery | Admitting: Orthopedic Surgery

## 2017-08-06 ENCOUNTER — Inpatient Hospital Stay (HOSPITAL_COMMUNITY): Payer: Medicare Other

## 2017-08-06 ENCOUNTER — Other Ambulatory Visit: Payer: Self-pay

## 2017-08-06 ENCOUNTER — Encounter (HOSPITAL_COMMUNITY): Payer: Self-pay

## 2017-08-06 DIAGNOSIS — K449 Diaphragmatic hernia without obstruction or gangrene: Secondary | ICD-10-CM | POA: Diagnosis present

## 2017-08-06 DIAGNOSIS — Z87891 Personal history of nicotine dependence: Secondary | ICD-10-CM | POA: Diagnosis not present

## 2017-08-06 DIAGNOSIS — K589 Irritable bowel syndrome without diarrhea: Secondary | ICD-10-CM | POA: Diagnosis not present

## 2017-08-06 DIAGNOSIS — G8918 Other acute postprocedural pain: Secondary | ICD-10-CM | POA: Diagnosis not present

## 2017-08-06 DIAGNOSIS — M75101 Unspecified rotator cuff tear or rupture of right shoulder, not specified as traumatic: Secondary | ICD-10-CM | POA: Diagnosis present

## 2017-08-06 DIAGNOSIS — Z79891 Long term (current) use of opiate analgesic: Secondary | ICD-10-CM | POA: Diagnosis not present

## 2017-08-06 DIAGNOSIS — E039 Hypothyroidism, unspecified: Secondary | ICD-10-CM | POA: Diagnosis present

## 2017-08-06 DIAGNOSIS — Z90710 Acquired absence of both cervix and uterus: Secondary | ICD-10-CM

## 2017-08-06 DIAGNOSIS — M797 Fibromyalgia: Secondary | ICD-10-CM | POA: Diagnosis not present

## 2017-08-06 DIAGNOSIS — E669 Obesity, unspecified: Secondary | ICD-10-CM | POA: Diagnosis present

## 2017-08-06 DIAGNOSIS — Z961 Presence of intraocular lens: Secondary | ICD-10-CM | POA: Diagnosis present

## 2017-08-06 DIAGNOSIS — Z881 Allergy status to other antibiotic agents status: Secondary | ICD-10-CM | POA: Diagnosis not present

## 2017-08-06 DIAGNOSIS — Z6834 Body mass index (BMI) 34.0-34.9, adult: Secondary | ICD-10-CM

## 2017-08-06 DIAGNOSIS — Z7901 Long term (current) use of anticoagulants: Secondary | ICD-10-CM | POA: Diagnosis not present

## 2017-08-06 DIAGNOSIS — Z96611 Presence of right artificial shoulder joint: Secondary | ICD-10-CM | POA: Diagnosis not present

## 2017-08-06 DIAGNOSIS — M19011 Primary osteoarthritis, right shoulder: Secondary | ICD-10-CM | POA: Diagnosis not present

## 2017-08-06 DIAGNOSIS — Z9842 Cataract extraction status, left eye: Secondary | ICD-10-CM | POA: Diagnosis not present

## 2017-08-06 DIAGNOSIS — Z91018 Allergy to other foods: Secondary | ICD-10-CM

## 2017-08-06 DIAGNOSIS — E1151 Type 2 diabetes mellitus with diabetic peripheral angiopathy without gangrene: Secondary | ICD-10-CM | POA: Diagnosis present

## 2017-08-06 DIAGNOSIS — Z9841 Cataract extraction status, right eye: Secondary | ICD-10-CM

## 2017-08-06 DIAGNOSIS — K219 Gastro-esophageal reflux disease without esophagitis: Secondary | ICD-10-CM | POA: Diagnosis present

## 2017-08-06 DIAGNOSIS — Z880 Allergy status to penicillin: Secondary | ICD-10-CM

## 2017-08-06 DIAGNOSIS — J449 Chronic obstructive pulmonary disease, unspecified: Secondary | ICD-10-CM | POA: Diagnosis present

## 2017-08-06 DIAGNOSIS — Z9049 Acquired absence of other specified parts of digestive tract: Secondary | ICD-10-CM | POA: Diagnosis not present

## 2017-08-06 DIAGNOSIS — Z794 Long term (current) use of insulin: Secondary | ICD-10-CM

## 2017-08-06 DIAGNOSIS — I10 Essential (primary) hypertension: Secondary | ICD-10-CM | POA: Diagnosis present

## 2017-08-06 DIAGNOSIS — Z471 Aftercare following joint replacement surgery: Secondary | ICD-10-CM | POA: Diagnosis not present

## 2017-08-06 DIAGNOSIS — Z86718 Personal history of other venous thrombosis and embolism: Secondary | ICD-10-CM

## 2017-08-06 DIAGNOSIS — Z79890 Hormone replacement therapy (postmenopausal): Secondary | ICD-10-CM

## 2017-08-06 DIAGNOSIS — Z79899 Other long term (current) drug therapy: Secondary | ICD-10-CM

## 2017-08-06 HISTORY — PX: REVERSE SHOULDER ARTHROPLASTY: SHX5054

## 2017-08-06 LAB — PROTIME-INR
INR: 0.98
PROTHROMBIN TIME: 12.9 s (ref 11.4–15.2)

## 2017-08-06 LAB — GLUCOSE, CAPILLARY
GLUCOSE-CAPILLARY: 130 mg/dL — AB (ref 65–99)
Glucose-Capillary: 125 mg/dL — ABNORMAL HIGH (ref 65–99)
Glucose-Capillary: 400 mg/dL — ABNORMAL HIGH (ref 65–99)
Glucose-Capillary: 427 mg/dL — ABNORMAL HIGH (ref 65–99)

## 2017-08-06 SURGERY — ARTHROPLASTY, SHOULDER, TOTAL, REVERSE
Anesthesia: General | Site: Shoulder | Laterality: Right

## 2017-08-06 MED ORDER — 0.9 % SODIUM CHLORIDE (POUR BTL) OPTIME
TOPICAL | Status: DC | PRN
  Administered 2017-08-06: 1000 mL

## 2017-08-06 MED ORDER — METHOCARBAMOL 500 MG PO TABS
500.0000 mg | ORAL_TABLET | Freq: Four times a day (QID) | ORAL | Status: DC | PRN
  Administered 2017-08-06 – 2017-08-08 (×4): 500 mg via ORAL
  Filled 2017-08-06 (×5): qty 1

## 2017-08-06 MED ORDER — HYDROCODONE-ACETAMINOPHEN 5-325 MG PO TABS
1.0000 | ORAL_TABLET | ORAL | Status: DC | PRN
  Administered 2017-08-06 – 2017-08-08 (×4): 2 via ORAL
  Filled 2017-08-06 (×5): qty 2

## 2017-08-06 MED ORDER — MIDAZOLAM HCL 2 MG/2ML IJ SOLN
INTRAMUSCULAR | Status: AC
  Filled 2017-08-06: qty 2

## 2017-08-06 MED ORDER — DOCUSATE SODIUM 100 MG PO CAPS
100.0000 mg | ORAL_CAPSULE | Freq: Two times a day (BID) | ORAL | Status: DC
  Administered 2017-08-07 – 2017-08-09 (×5): 100 mg via ORAL
  Filled 2017-08-06 (×6): qty 1

## 2017-08-06 MED ORDER — PANTOPRAZOLE SODIUM 40 MG PO TBEC
40.0000 mg | DELAYED_RELEASE_TABLET | Freq: Every day | ORAL | Status: DC
Start: 2017-08-07 — End: 2017-08-09
  Administered 2017-08-07 – 2017-08-09 (×3): 40 mg via ORAL
  Filled 2017-08-06 (×4): qty 1

## 2017-08-06 MED ORDER — DULOXETINE HCL 30 MG PO CPEP
30.0000 mg | ORAL_CAPSULE | Freq: Two times a day (BID) | ORAL | Status: DC
  Administered 2017-08-06 – 2017-08-09 (×6): 30 mg via ORAL
  Filled 2017-08-06 (×6): qty 1

## 2017-08-06 MED ORDER — HYDROXYZINE HCL 25 MG PO TABS: 25.0000 mg | ORAL_TABLET | Freq: Three times a day (TID) | ORAL | Status: DC | PRN

## 2017-08-06 MED ORDER — INSULIN ASPART 100 UNIT/ML ~~LOC~~ SOLN
30.0000 [IU] | Freq: Three times a day (TID) | SUBCUTANEOUS | Status: DC
  Administered 2017-08-06 – 2017-08-09 (×9): 30 [IU] via SUBCUTANEOUS

## 2017-08-06 MED ORDER — ONDANSETRON HCL 4 MG PO TABS
4.0000 mg | ORAL_TABLET | Freq: Four times a day (QID) | ORAL | Status: DC | PRN
  Filled 2017-08-06: qty 1

## 2017-08-06 MED ORDER — VITAMIN E 45 MG (100 UNIT) PO CAPS
200.0000 [IU] | ORAL_CAPSULE | Freq: Every day | ORAL | Status: DC
  Administered 2017-08-06 – 2017-08-09 (×4): 200 [IU] via ORAL
  Filled 2017-08-06 (×4): qty 2

## 2017-08-06 MED ORDER — CLINDAMYCIN PHOSPHATE 600 MG/50ML IV SOLN
600.0000 mg | Freq: Four times a day (QID) | INTRAVENOUS | Status: AC
  Administered 2017-08-06 – 2017-08-07 (×3): 600 mg via INTRAVENOUS
  Filled 2017-08-06 (×3): qty 50

## 2017-08-06 MED ORDER — TIZANIDINE HCL 4 MG PO TABS
4.0000 mg | ORAL_TABLET | Freq: Four times a day (QID) | ORAL | Status: DC
  Administered 2017-08-06 – 2017-08-09 (×12): 4 mg via ORAL
  Filled 2017-08-06 (×14): qty 1

## 2017-08-06 MED ORDER — METOPROLOL TARTRATE 25 MG PO TABS
125.0000 mg | ORAL_TABLET | Freq: Two times a day (BID) | ORAL | Status: DC
  Administered 2017-08-07 – 2017-08-09 (×4): 125 mg via ORAL
  Filled 2017-08-06 (×6): qty 1

## 2017-08-06 MED ORDER — BENZONATATE 100 MG PO CAPS: 100.0000 mg | ORAL_CAPSULE | Freq: Every day | ORAL | Status: DC | PRN

## 2017-08-06 MED ORDER — LISINOPRIL 2.5 MG PO TABS
2.5000 mg | ORAL_TABLET | Freq: Every day | ORAL | Status: DC
  Administered 2017-08-06 – 2017-08-09 (×2): 2.5 mg via ORAL
  Filled 2017-08-06 (×4): qty 1

## 2017-08-06 MED ORDER — METHOCARBAMOL 500 MG PO TABS: 500.0000 mg | ORAL_TABLET | Freq: Three times a day (TID) | ORAL | 1 refills | Status: DC | PRN

## 2017-08-06 MED ORDER — CLINDAMYCIN PHOSPHATE 900 MG/50ML IV SOLN
INTRAVENOUS | Status: AC
  Filled 2017-08-06: qty 50

## 2017-08-06 MED ORDER — COD LIVER OIL PO CAPS: 1.0000 | ORAL_CAPSULE | Freq: Every day | ORAL | Status: DC

## 2017-08-06 MED ORDER — OMEGA-3-ACID ETHYL ESTERS 1 G PO CAPS
1.0000 g | ORAL_CAPSULE | Freq: Every day | ORAL | Status: DC
  Administered 2017-08-06 – 2017-08-08 (×3): 1 g via ORAL
  Filled 2017-08-06 (×3): qty 1

## 2017-08-06 MED ORDER — ONDANSETRON HCL 4 MG/2ML IJ SOLN
4.0000 mg | Freq: Four times a day (QID) | INTRAMUSCULAR | Status: DC | PRN
  Administered 2017-08-07 (×3): 4 mg via INTRAVENOUS
  Filled 2017-08-06 (×3): qty 2

## 2017-08-06 MED ORDER — ALBUTEROL SULFATE (2.5 MG/3ML) 0.083% IN NEBU
2.5000 mg | INHALATION_SOLUTION | Freq: Two times a day (BID) | RESPIRATORY_TRACT | Status: DC
  Administered 2017-08-06 – 2017-08-09 (×6): 2.5 mg via RESPIRATORY_TRACT
  Filled 2017-08-06 (×6): qty 3

## 2017-08-06 MED ORDER — LORATADINE 10 MG PO TABS
10.0000 mg | ORAL_TABLET | Freq: Every day | ORAL | Status: DC
  Administered 2017-08-06 – 2017-08-09 (×4): 10 mg via ORAL
  Filled 2017-08-06 (×4): qty 1

## 2017-08-06 MED ORDER — OXYCODONE HCL 5 MG PO TABS: 5.0000 mg | ORAL_TABLET | Freq: Once | ORAL | Status: DC | PRN

## 2017-08-06 MED ORDER — ONDANSETRON HCL 4 MG/2ML IJ SOLN: 4.0000 mg | Freq: Once | INTRAMUSCULAR | Status: DC | PRN

## 2017-08-06 MED ORDER — PROPOFOL 500 MG/50ML IV EMUL
INTRAVENOUS | Status: DC | PRN
  Administered 2017-08-06: 25 ug/kg/min via INTRAVENOUS

## 2017-08-06 MED ORDER — METOCLOPRAMIDE HCL 5 MG PO TABS: 5.0000 mg | ORAL_TABLET | Freq: Three times a day (TID) | ORAL | Status: DC | PRN

## 2017-08-06 MED ORDER — PROPOFOL 10 MG/ML IV BOLUS
INTRAVENOUS | Status: AC
  Filled 2017-08-06: qty 20

## 2017-08-06 MED ORDER — BUPIVACAINE-EPINEPHRINE (PF) 0.25% -1:200000 IJ SOLN
INTRAMUSCULAR | Status: AC
  Filled 2017-08-06: qty 30

## 2017-08-06 MED ORDER — MOMETASONE FUROATE 0.1 % EX CREA
1.0000 "application " | TOPICAL_CREAM | Freq: Every day | CUTANEOUS | Status: DC | PRN
  Filled 2017-08-06: qty 15

## 2017-08-06 MED ORDER — PRAVASTATIN SODIUM 40 MG PO TABS
80.0000 mg | ORAL_TABLET | Freq: Every day | ORAL | Status: DC
  Administered 2017-08-06 – 2017-08-08 (×3): 80 mg via ORAL
  Filled 2017-08-06 (×3): qty 2

## 2017-08-06 MED ORDER — BUDESONIDE 0.25 MG/2ML IN SUSP
0.2500 mg | Freq: Two times a day (BID) | RESPIRATORY_TRACT | Status: DC
  Administered 2017-08-06 – 2017-08-09 (×6): 0.25 mg via RESPIRATORY_TRACT
  Filled 2017-08-06 (×6): qty 2

## 2017-08-06 MED ORDER — HYDROCODONE-ACETAMINOPHEN 5-325 MG PO TABS: 1.0000 | ORAL_TABLET | Freq: Four times a day (QID) | ORAL | 0 refills | Status: DC | PRN

## 2017-08-06 MED ORDER — ROCURONIUM BROMIDE 100 MG/10ML IV SOLN
INTRAVENOUS | Status: DC | PRN
  Administered 2017-08-06: 50 mg via INTRAVENOUS

## 2017-08-06 MED ORDER — INSULIN GLARGINE 100 UNIT/ML ~~LOC~~ SOLN
80.0000 [IU] | Freq: Every day | SUBCUTANEOUS | Status: DC
  Administered 2017-08-06 – 2017-08-08 (×3): 80 [IU] via SUBCUTANEOUS
  Filled 2017-08-06 (×4): qty 0.8

## 2017-08-06 MED ORDER — SODIUM CHLORIDE 0.9 % IV SOLN
INTRAVENOUS | Status: DC
  Administered 2017-08-06 (×2): via INTRAVENOUS

## 2017-08-06 MED ORDER — MIDAZOLAM HCL 5 MG/5ML IJ SOLN
INTRAMUSCULAR | Status: DC | PRN
  Administered 2017-08-06: 2 mg via INTRAVENOUS

## 2017-08-06 MED ORDER — ALBUTEROL SULFATE (2.5 MG/3ML) 0.083% IN NEBU: 2.5000 mg | INHALATION_SOLUTION | Freq: Four times a day (QID) | RESPIRATORY_TRACT | Status: DC

## 2017-08-06 MED ORDER — ACETAMINOPHEN 500 MG PO TABS
1000.0000 mg | ORAL_TABLET | Freq: Three times a day (TID) | ORAL | Status: DC
  Administered 2017-08-06 – 2017-08-08 (×5): 1000 mg via ORAL
  Filled 2017-08-06 (×8): qty 2

## 2017-08-06 MED ORDER — CLOBETASOL PROPIONATE 0.05 % EX OINT
1.0000 "application " | TOPICAL_OINTMENT | Freq: Two times a day (BID) | CUTANEOUS | Status: DC | PRN
  Filled 2017-08-06: qty 15

## 2017-08-06 MED ORDER — LIDOCAINE HCL (CARDIAC) PF 100 MG/5ML IV SOSY
PREFILLED_SYRINGE | INTRAVENOUS | Status: DC | PRN
  Administered 2017-08-06: 60 mg via INTRAVENOUS

## 2017-08-06 MED ORDER — LUBIPROSTONE 8 MCG PO CAPS
8.0000 ug | ORAL_CAPSULE | Freq: Two times a day (BID) | ORAL | Status: DC
  Administered 2017-08-09: 8 ug via ORAL
  Filled 2017-08-06 (×7): qty 1

## 2017-08-06 MED ORDER — METOLAZONE 5 MG PO TABS
5.0000 mg | ORAL_TABLET | Freq: Two times a day (BID) | ORAL | Status: DC
  Administered 2017-08-06 – 2017-08-09 (×5): 5 mg via ORAL
  Filled 2017-08-06 (×9): qty 1

## 2017-08-06 MED ORDER — PHENOL 1.4 % MT LIQD: 1.0000 | OROMUCOSAL | Status: DC | PRN

## 2017-08-06 MED ORDER — ONDANSETRON HCL 4 MG/2ML IJ SOLN
INTRAMUSCULAR | Status: DC | PRN
  Administered 2017-08-06: 4 mg via INTRAVENOUS

## 2017-08-06 MED ORDER — PREGABALIN 75 MG PO CAPS
75.0000 mg | ORAL_CAPSULE | Freq: Two times a day (BID) | ORAL | Status: DC
  Administered 2017-08-06 – 2017-08-09 (×6): 75 mg via ORAL
  Filled 2017-08-06 (×6): qty 1

## 2017-08-06 MED ORDER — PHENYLEPHRINE HCL 10 MG/ML IJ SOLN
INTRAMUSCULAR | Status: DC | PRN
  Administered 2017-08-06: 25 ug/min via INTRAVENOUS

## 2017-08-06 MED ORDER — HYDROCODONE-ACETAMINOPHEN 7.5-325 MG PO TABS
1.0000 | ORAL_TABLET | ORAL | Status: DC | PRN
  Administered 2017-08-07 – 2017-08-09 (×7): 2 via ORAL
  Filled 2017-08-06 (×9): qty 2

## 2017-08-06 MED ORDER — ALBUTEROL SULFATE (2.5 MG/3ML) 0.083% IN NEBU: 2.5000 mg | INHALATION_SOLUTION | Freq: Four times a day (QID) | RESPIRATORY_TRACT | Status: DC | PRN

## 2017-08-06 MED ORDER — FENTANYL CITRATE (PF) 100 MCG/2ML IJ SOLN: 25.0000 ug | INTRAMUSCULAR | Status: DC | PRN

## 2017-08-06 MED ORDER — INSULIN LISPRO 100 UNIT/ML (KWIKPEN): 30.0000 [IU] | PEN_INJECTOR | Freq: Three times a day (TID) | SUBCUTANEOUS | Status: DC

## 2017-08-06 MED ORDER — INSULIN GLARGINE 100 UNIT/ML SOLOSTAR PEN: 75.0000 [IU] | PEN_INJECTOR | Freq: Two times a day (BID) | SUBCUTANEOUS | Status: DC

## 2017-08-06 MED ORDER — METOCLOPRAMIDE HCL 5 MG/ML IJ SOLN: 5.0000 mg | Freq: Three times a day (TID) | INTRAMUSCULAR | Status: DC | PRN

## 2017-08-06 MED ORDER — VITAMIN B-12 1000 MCG PO TABS
1000.0000 ug | ORAL_TABLET | Freq: Every day | ORAL | Status: DC
  Administered 2017-08-06 – 2017-08-09 (×4): 1000 ug via ORAL
  Filled 2017-08-06 (×4): qty 1

## 2017-08-06 MED ORDER — FLUTICASONE FUROATE 27.5 MCG/SPRAY NA SUSP: 2.0000 | Freq: Every day | NASAL | Status: DC

## 2017-08-06 MED ORDER — CHLORHEXIDINE GLUCONATE 4 % EX LIQD: 60.0000 mL | Freq: Once | CUTANEOUS | Status: DC

## 2017-08-06 MED ORDER — PROPOFOL 10 MG/ML IV BOLUS
INTRAVENOUS | Status: DC | PRN
  Administered 2017-08-06: 130 mg via INTRAVENOUS

## 2017-08-06 MED ORDER — METOPROLOL TARTRATE 25 MG PO TABS: 25.0000 mg | ORAL_TABLET | Freq: Two times a day (BID) | ORAL | Status: DC

## 2017-08-06 MED ORDER — SENNOSIDES-DOCUSATE SODIUM 8.6-50 MG PO TABS: 1.0000 | ORAL_TABLET | Freq: Every evening | ORAL | Status: DC | PRN

## 2017-08-06 MED ORDER — TRAMADOL HCL 50 MG PO TABS
50.0000 mg | ORAL_TABLET | Freq: Four times a day (QID) | ORAL | Status: DC | PRN
  Administered 2017-08-06: 50 mg via ORAL
  Administered 2017-08-07 – 2017-08-09 (×3): 100 mg via ORAL
  Filled 2017-08-06 (×5): qty 2

## 2017-08-06 MED ORDER — BISACODYL 10 MG RE SUPP: 10.0000 mg | Freq: Every day | RECTAL | Status: DC | PRN

## 2017-08-06 MED ORDER — VITAMIN D 1000 UNITS PO TABS
4000.0000 [IU] | ORAL_TABLET | Freq: Every day | ORAL | Status: DC
  Administered 2017-08-06 – 2017-08-09 (×4): 4000 [IU] via ORAL
  Filled 2017-08-06 (×4): qty 4

## 2017-08-06 MED ORDER — INSULIN GLARGINE 100 UNIT/ML ~~LOC~~ SOLN
75.0000 [IU] | Freq: Every day | SUBCUTANEOUS | Status: DC
  Administered 2017-08-07 – 2017-08-09 (×3): 75 [IU] via SUBCUTANEOUS
  Filled 2017-08-06 (×3): qty 0.75

## 2017-08-06 MED ORDER — MORPHINE SULFATE (PF) 2 MG/ML IV SOLN
0.5000 mg | INTRAVENOUS | Status: DC | PRN
  Administered 2017-08-07 – 2017-08-09 (×6): 1 mg via INTRAVENOUS
  Filled 2017-08-06 (×6): qty 1

## 2017-08-06 MED ORDER — CLINDAMYCIN PHOSPHATE 900 MG/50ML IV SOLN
900.0000 mg | INTRAVENOUS | Status: AC
  Administered 2017-08-06: 900 mg via INTRAVENOUS

## 2017-08-06 MED ORDER — APIXABAN 5 MG PO TABS
5.0000 mg | ORAL_TABLET | Freq: Two times a day (BID) | ORAL | Status: DC
  Administered 2017-08-07 – 2017-08-09 (×5): 5 mg via ORAL
  Filled 2017-08-06 (×5): qty 1

## 2017-08-06 MED ORDER — BUPIVACAINE-EPINEPHRINE 0.25% -1:200000 IJ SOLN
INTRAMUSCULAR | Status: DC | PRN
  Administered 2017-08-06: 2 mL

## 2017-08-06 MED ORDER — METHOCARBAMOL 1000 MG/10ML IJ SOLN
500.0000 mg | Freq: Four times a day (QID) | INTRAVENOUS | Status: DC | PRN
  Filled 2017-08-06: qty 5

## 2017-08-06 MED ORDER — INSULIN ASPART 100 UNIT/ML ~~LOC~~ SOLN
15.0000 [IU] | Freq: Once | SUBCUTANEOUS | Status: AC
  Administered 2017-08-06: 15 [IU] via SUBCUTANEOUS

## 2017-08-06 MED ORDER — FENTANYL CITRATE (PF) 100 MCG/2ML IJ SOLN
INTRAMUSCULAR | Status: DC | PRN
  Administered 2017-08-06: 100 ug via INTRAVENOUS

## 2017-08-06 MED ORDER — MELATONIN 3 MG PO TABS
9.0000 mg | ORAL_TABLET | Freq: Every day | ORAL | Status: DC
  Administered 2017-08-06 – 2017-08-08 (×3): 9 mg via ORAL
  Filled 2017-08-06 (×3): qty 3

## 2017-08-06 MED ORDER — TORSEMIDE 20 MG PO TABS
40.0000 mg | ORAL_TABLET | Freq: Every day | ORAL | Status: DC
  Administered 2017-08-06 – 2017-08-09 (×2): 40 mg via ORAL
  Filled 2017-08-06 (×4): qty 2

## 2017-08-06 MED ORDER — VITAMIN C 500 MG PO TABS
1000.0000 mg | ORAL_TABLET | Freq: Every day | ORAL | Status: DC
  Administered 2017-08-06 – 2017-08-09 (×4): 1000 mg via ORAL
  Filled 2017-08-06 (×4): qty 2

## 2017-08-06 MED ORDER — BUPIVACAINE-EPINEPHRINE (PF) 0.5% -1:200000 IJ SOLN
INTRAMUSCULAR | Status: DC | PRN
  Administered 2017-08-06: 25 mL via PERINEURAL

## 2017-08-06 MED ORDER — METOPROLOL TARTRATE 100 MG PO TABS: 100.0000 mg | ORAL_TABLET | Freq: Two times a day (BID) | ORAL | Status: DC

## 2017-08-06 MED ORDER — OXYCODONE HCL 5 MG/5ML PO SOLN: 5.0000 mg | Freq: Once | ORAL | Status: DC | PRN

## 2017-08-06 MED ORDER — FENTANYL CITRATE (PF) 250 MCG/5ML IJ SOLN
INTRAMUSCULAR | Status: AC
  Filled 2017-08-06: qty 5

## 2017-08-06 MED ORDER — CLONIDINE HCL (ANALGESIA) 100 MCG/ML EP SOLN
EPIDURAL | Status: DC | PRN
  Administered 2017-08-06: 50 ug

## 2017-08-06 MED ORDER — SUGAMMADEX SODIUM 200 MG/2ML IV SOLN
INTRAVENOUS | Status: DC | PRN
  Administered 2017-08-06: 200 mg via INTRAVENOUS

## 2017-08-06 MED ORDER — MENTHOL 3 MG MT LOZG: 1.0000 | LOZENGE | OROMUCOSAL | Status: DC | PRN

## 2017-08-06 MED ORDER — DULAGLUTIDE 1.5 MG/0.5ML ~~LOC~~ SOAJ: 1.5000 mg | SUBCUTANEOUS | Status: DC

## 2017-08-06 MED ORDER — LACTATED RINGERS IV SOLN
INTRAVENOUS | Status: DC | PRN
  Administered 2017-08-06: 07:00:00 via INTRAVENOUS

## 2017-08-06 MED ORDER — LEVOTHYROXINE SODIUM 100 MCG PO TABS
100.0000 ug | ORAL_TABLET | Freq: Every day | ORAL | Status: DC
  Administered 2017-08-07 – 2017-08-09 (×3): 100 ug via ORAL
  Filled 2017-08-06 (×3): qty 1

## 2017-08-06 MED ORDER — FLUTICASONE PROPIONATE 50 MCG/ACT NA SUSP
2.0000 | Freq: Every day | NASAL | Status: DC | PRN
  Filled 2017-08-06: qty 16

## 2017-08-06 MED ORDER — ACETAMINOPHEN 325 MG PO TABS
325.0000 mg | ORAL_TABLET | Freq: Four times a day (QID) | ORAL | Status: DC | PRN
  Administered 2017-08-08: 650 mg via ORAL
  Filled 2017-08-06: qty 2

## 2017-08-06 MED ORDER — DEXAMETHASONE SODIUM PHOSPHATE 10 MG/ML IJ SOLN
INTRAMUSCULAR | Status: DC | PRN
  Administered 2017-08-06: 10 mg via INTRAVENOUS

## 2017-08-06 SURGICAL SUPPLY — 72 items
BASEPLATE GLENOSPHERE 25 (Plate) ×2 IMPLANT
BASEPLATE GLENOSPHERE 25MM (Plate) ×1 IMPLANT
BEARING HUMERAL SHLDER 36M STD (Shoulder) ×1 IMPLANT
BIT DRILL 5/64X5 DISP (BIT) ×3 IMPLANT
BIT DRILL F/CENTRAL SCRW 3.2 (BIT) ×1
BIT DRILL F/CENTRAL SCRW 3.2MM (BIT) ×1 IMPLANT
BIT DRILL TWIST 2.7 (BIT) ×2 IMPLANT
BIT DRILL TWIST 2.7MM (BIT) ×1
BLADE SAG 18X100X1.27 (BLADE) ×3 IMPLANT
CLOSURE STERI-STRIP 1/2X4 (GAUZE/BANDAGES/DRESSINGS) ×1
CLOSURE WOUND 1/2 X4 (GAUZE/BANDAGES/DRESSINGS) ×1
CLSR STERI-STRIP ANTIMIC 1/2X4 (GAUZE/BANDAGES/DRESSINGS) ×2 IMPLANT
COVER SURGICAL LIGHT HANDLE (MISCELLANEOUS) ×3 IMPLANT
DRAPE INCISE IOBAN 66X45 STRL (DRAPES) IMPLANT
DRAPE ORTHO SPLIT 77X108 STRL (DRAPES) ×4
DRAPE SURG ORHT 6 SPLT 77X108 (DRAPES) ×2 IMPLANT
DRAPE U-SHAPE 47X51 STRL (DRAPES) ×3 IMPLANT
DRILL BIT F/CENTRAL SCRW 3.2MM (BIT) ×2
DRSG ADAPTIC 3X8 NADH LF (GAUZE/BANDAGES/DRESSINGS) ×3 IMPLANT
DRSG PAD ABDOMINAL 8X10 ST (GAUZE/BANDAGES/DRESSINGS) ×3 IMPLANT
DURAPREP 26ML APPLICATOR (WOUND CARE) ×3 IMPLANT
ELECT BLADE 4.0 EZ CLEAN MEGAD (MISCELLANEOUS) ×3
ELECT NEEDLE TIP 2.8 STRL (NEEDLE) ×3 IMPLANT
ELECT REM PT RETURN 9FT ADLT (ELECTROSURGICAL) ×3
ELECTRODE BLDE 4.0 EZ CLN MEGD (MISCELLANEOUS) ×1 IMPLANT
ELECTRODE REM PT RTRN 9FT ADLT (ELECTROSURGICAL) ×1 IMPLANT
GAUZE SPONGE 4X4 12PLY STRL (GAUZE/BANDAGES/DRESSINGS) ×3 IMPLANT
GAUZE SPONGE 4X4 12PLY STRL LF (GAUZE/BANDAGES/DRESSINGS) ×3 IMPLANT
GLENOID SPHERE STD STRL 36MM (Orthopedic Implant) ×3 IMPLANT
GLOVE BIOGEL PI ORTHO PRO 7.5 (GLOVE) ×2
GLOVE BIOGEL PI ORTHO PRO SZ8 (GLOVE) ×2
GLOVE ORTHO TXT STRL SZ7.5 (GLOVE) ×3 IMPLANT
GLOVE PI ORTHO PRO STRL 7.5 (GLOVE) ×1 IMPLANT
GLOVE PI ORTHO PRO STRL SZ8 (GLOVE) ×1 IMPLANT
GLOVE SURG ORTHO 8.5 STRL (GLOVE) ×3 IMPLANT
GOWN STRL REUS W/ TWL LRG LVL3 (GOWN DISPOSABLE) ×1 IMPLANT
GOWN STRL REUS W/ TWL XL LVL3 (GOWN DISPOSABLE) ×2 IMPLANT
GOWN STRL REUS W/TWL LRG LVL3 (GOWN DISPOSABLE) ×2
GOWN STRL REUS W/TWL XL LVL3 (GOWN DISPOSABLE) ×4
KIT BASIN OR (CUSTOM PROCEDURE TRAY) ×3 IMPLANT
KIT TURNOVER KIT B (KITS) ×3 IMPLANT
MANIFOLD NEPTUNE II (INSTRUMENTS) ×3 IMPLANT
NEEDLE 1/2 CIR MAYO (NEEDLE) ×3 IMPLANT
NEEDLE HYPO 25GX1X1/2 BEV (NEEDLE) ×3 IMPLANT
NS IRRIG 1000ML POUR BTL (IV SOLUTION) ×3 IMPLANT
PACK SHOULDER (CUSTOM PROCEDURE TRAY) ×3 IMPLANT
PAD ABD 8X10 STRL (GAUZE/BANDAGES/DRESSINGS) ×3 IMPLANT
PAD ARMBOARD 7.5X6 YLW CONV (MISCELLANEOUS) ×6 IMPLANT
RESTRAINT HEAD UNIVERSAL NS (MISCELLANEOUS) ×3 IMPLANT
SCREW BONE LOCKING 4.75X30X3.5 (Screw) ×6 IMPLANT
SCREW BONE STRL 6.5MMX25MM (Screw) ×3 IMPLANT
SHOULDER HUMERAL BEAR 36M STD (Shoulder) ×3 IMPLANT
SPONGE LAP 18X18 X RAY DECT (DISPOSABLE) IMPLANT
SPONGE LAP 4X18 RFD (DISPOSABLE) IMPLANT
STEM SHOULDER (Stem) ×3 IMPLANT
STRIP CLOSURE SKIN 1/2X4 (GAUZE/BANDAGES/DRESSINGS) ×2 IMPLANT
SUCTION FRAZIER HANDLE 10FR (MISCELLANEOUS) ×2
SUCTION TUBE FRAZIER 10FR DISP (MISCELLANEOUS) ×1 IMPLANT
SUT FIBERWIRE #2 38 T-5 BLUE (SUTURE) ×15
SUT MNCRL AB 4-0 PS2 18 (SUTURE) ×3 IMPLANT
SUT VIC AB 0 CT2 27 (SUTURE) ×3 IMPLANT
SUT VIC AB 2-0 CT1 27 (SUTURE) ×2
SUT VIC AB 2-0 CT1 TAPERPNT 27 (SUTURE) ×1 IMPLANT
SUT VICRYL 0 CT 1 36IN (SUTURE) ×3 IMPLANT
SUTURE FIBERWR #2 38 T-5 BLUE (SUTURE) ×5 IMPLANT
SYR CONTROL 10ML LL (SYRINGE) ×3 IMPLANT
TAPE CLOTH SURG 4X10 WHT LF (GAUZE/BANDAGES/DRESSINGS) ×3 IMPLANT
TOWEL OR 17X24 6PK STRL BLUE (TOWEL DISPOSABLE) ×3 IMPLANT
TOWEL OR 17X26 10 PK STRL BLUE (TOWEL DISPOSABLE) ×3 IMPLANT
TOWER CARTRIDGE SMART MIX (DISPOSABLE) IMPLANT
TRAY HUM MINI SHOULDER +0 40D (Shoulder) ×3 IMPLANT
YANKAUER SUCT BULB TIP NO VENT (SUCTIONS) ×3 IMPLANT

## 2017-08-06 NOTE — Discharge Instructions (Signed)
Keep pillow propped behind the right elbow to keep arm across your waist.  Do not push your full body weight up with the right arm.  Ok to use the arm for basic self care and eating and dressing.  DO NOT reach behind you with the right arm  Do exercises as instructed.  May remove sling as you feel comfortable.  Keep the incision clean and dry for one week, then ok to get it wet in the shower.  Follow up in the office with Dr Veverly Fells in two weeks, call [phone]   Information on my medicine - ELIQUIS (apixaban)  Why was Eliquis prescribed for you? Eliquis was prescribed for you to reduce the risk of forming blood clots that can cause a stroke if you have a medical condition called atrial fibrillation (a type of irregular heartbeat) OR to reduce the risk of a blood clots forming after orthopedic surgery.  What do You need to know about Eliquis ? Take your Eliquis TWICE DAILY - one tablet in the morning and one tablet in the evening with or without food.  It would be best to take the doses about the same time each day.  If you have difficulty swallowing the tablet whole please discuss with your pharmacist how to take the medication safely.  Take Eliquis exactly as prescribed by your doctor and DO NOT stop taking Eliquis without talking to the doctor who prescribed the medication.  Stopping may increase your risk of developing a new clot or stroke.  Refill your prescription before you run out.  After discharge, you should have regular check-up appointments with your healthcare provider that is prescribing your Eliquis.  In the future your dose may need to be changed if your kidney function or weight changes by a significant amount or as you get older.  What do you do if you miss a dose? If you miss a dose, take it as soon as you remember on the same day and resume taking twice daily.  Do not take more than one dose of ELIQUIS at the same time.  Important Safety Information A  possible side effect of Eliquis is bleeding. You should call your healthcare provider right away if you experience any of the following: ? Bleeding from an injury or your nose that does not stop. ? Unusual colored urine (red or dark brown) or unusual colored stools (red or black). ? Unusual bruising for unknown reasons. ? A serious fall or if you hit your head (even if there is no bleeding).  Some medicines may interact with Eliquis and might increase your risk of bleeding or clotting while on Eliquis. To help avoid this, consult your healthcare provider or pharmacist prior to using any new prescription or non-prescription medications, including herbals, vitamins, non-steroidal anti-inflammatory drugs (NSAIDs) and supplements.  This website has more information on Eliquis (apixaban): [email or web address].no.

## 2017-08-06 NOTE — Interval H&P Note (Signed)
History and Physical Interval Note:  08/06/2017 7:15 AM  Stephanie [name]  has presented today for surgery, with the diagnosis of Right shoulder rotator cuff tear, osteoarthritis  The various methods of treatment have been discussed with the patient and family. After consideration of risks, benefits and other options for treatment, the patient has consented to  Procedure(s): RIGHT REVERSE SHOULDER ARTHROPLASTY (Right) as a surgical intervention .  The patient's history has been reviewed, patient examined, no change in status, stable for surgery.  I have reviewed the patient's chart and labs.  Questions were answered to the patient's satisfaction.     Stacyann Mcconaughy,STEVEN R

## 2017-08-06 NOTE — Anesthesia Procedure Notes (Signed)
Anesthesia Regional Block: Interscalene brachial plexus block   Pre-Anesthetic Checklist: ,, timeout performed, Correct Patient, Correct Site, Correct Laterality, Correct Procedure, Correct Position, site marked, Risks and benefits discussed,  Surgical consent,  Pre-op evaluation,  At surgeon's request and post-op pain management  Laterality: Right  Prep: chloraprep       Needles:  Injection technique: Single-shot  Needle Type: Echogenic Needle     Needle Length: 9cm  Needle Gauge: 21     Additional Needles:   Narrative:  Start time: 08/06/2017 7:09 AM End time: 08/06/2017 7:13 AM Injection made incrementally with aspirations every 5 mL.  Performed by: Personally  Anesthesiologist: Audry Pili, MD  Additional Notes: No pain on injection. No increased resistance to injection. Injection made in 5cc increments. Good needle visualization. Patient tolerated the procedure well.

## 2017-08-06 NOTE — Progress Notes (Signed)
CRITICAL VALUE ALERT  Critical Value:  CBG 427  Date & Time Notied:  08/06/2017 2220  Provider Notified: Stann Mainland  Orders Received/Actions taken: to give  Novolog 15 units

## 2017-08-06 NOTE — Transfer of Care (Signed)
Immediate Anesthesia Transfer of Care Note  Patient: Stephanie [name]  Procedure(s) Performed: RIGHT REVERSE SHOULDER ARTHROPLASTY (Right Shoulder)  Patient Location: PACU  Anesthesia Type:GA combined with regional for post-op pain  Level of Consciousness: awake, alert  and oriented  Airway & Oxygen Therapy: Patient Spontanous Breathing and Patient connected to nasal cannula oxygen  Post-op Assessment: Report given to RN, Post -op Vital signs reviewed and stable and Patient moving all extremities  Post vital signs: Reviewed and stable  Last Vitals:  Vitals Value Taken Time  BP 107/64 08/06/2017 10:07 AM  Temp    Pulse 73 08/06/2017 10:07 AM  Resp 22 08/06/2017 10:07 AM  SpO2 99 % 08/06/2017 10:07 AM  Vitals shown include unvalidated device data.  Last Pain:  Vitals:   08/06/17 0600  TempSrc:   PainSc: 4          Complications: No apparent anesthesia complications

## 2017-08-06 NOTE — Op Note (Signed)
NAME: BRINSLEY, WENCE MEDICAL RECORD NF:[id number] ACCOUNT [id number] DATE OF BIRTH:1951/01/13 FACILITY: [facility] LOCATION: [facility] PHYSICIAN:STEVEN Orlena Sheldon, MD  OPERATIVE REPORT  DATE OF PROCEDURE:  08/06/2017  PREOPERATIVE DIAGNOSIS:  Right shoulder end-stage osteoarthritis as well as rotator cuff insufficiency.  POSTOPERATIVE DIAGNOSIS:  Right shoulder end-stage osteoarthritis as well as rotator cuff insufficiency.  PROCEDURE PERFORMED:  Right reverse shoulder replacement using Biomet comprehensive system, subscapularis repair.  ATTENDING SURGEON:  Esmond Plants, MD.    ASSISTANT:  Shelle Iron, PA-C, who was scrubbed in during the entire procedure and necessary for satisfactory completion of surgery.  ANESTHESIA:  General anesthesia plus interscalene block.  ESTIMATED BLOOD LOSS:  Less than 100 mL.  FLUID REPLACEMENT:  1200 mL crystalloid.    INSTRUMENT COUNTS:  Correct.  COMPLICATIONS:  Perioperative antibiotics were given.  INDICATIONS:  The patient is a 67 year old female with a history of worsening right shoulder pain secondary to rotator cuff insufficiency and arthritis.  The patient has had progressive pain and functional loss despite conservative management and desires  operative treatment to restore function and eliminate pain in the shoulder.  Informed consent was obtained.  DESCRIPTION OF PROCEDURE:  After an adequate level of anesthesia was achieved, the patient was positioned in the modified beach chair position.  The right shoulder was correctly identified and sterilely prepped and draped in the usual manner.  Timeout  was called.  We entered the shoulder using standard deltopectoral incision.  We started at the coracoid process extending down to the anterior humerus.  Dissection down through subcutaneous tissues using Bovie identified the cephalic vein and took it  laterally with the deltoid.  Pectoralis was taken medially.  Conjoined tendon identified and retracted  medially.  We tenodesed the biceps in situ with 0 Vicryl figure-of-eight suture into the pec tendon insertion and had a nice soft tissue tenodesis  there.  We then released the subscapularis subperiosteally off the lesser tuberosity and tagged that for repair at the end.  We then extended the shoulder, divided the biceps tendon and the remaining supraspinatus.  There was a little bit anteriorly and  then the remainder of the posterior supraspinatus and the infraspinatus was totally gone and retracted.  Once we had that released, we preserved the teres minor.  We then entered the proximal humerus with the reamer and reamed up to a size 10 for the  mini stem for the Biomet comprehensive system.  Once we had that preparation completely subluxed the humerus posteriorly, we then did our 360 degree capsular labral excision.  We got down to where the glenoid was.  There was definitely full thickness  cartilage loss and wear there.  We placed our central guide pin angled at 10 degrees inferiorly referencing off the 6 o'clock position on the shoulder.  We then reamed for the baseplate.  We impacted the baseplate into position, made sure that our depth  was appropriate and then placed a 25 mm central 6.5 lag screw with good compression and good purchase.  We then placed superior and inferior locked screws of 25 and 30 mm lengths.  Once those were in place, we had excellent baseplate fixation.  There was  insufficient anterior, posterior width to allow for our AP screws.  We then selected a 36 standard glenosphere and placed that into the taper and impacted that in position.  We set it on the B setting, which provided 1 mm of inferior displacement.  We  had nice inferior clearance.  Axillary nerve was  free and clear.  We then completed our humeral preparation.  We impacted our size 10 trial stem.  We then took our mini baseplate and put that on top with the 36 poly with no additional offset.  Once we  had that in  place, we reduced the shoulder and were happy with our soft tissue balancing and stability.  We removed the trial components from the humerus side, drilled holes in the lesser tuberosity, placed #2 FiberWire suture in for repair of the  subscap.  We then impacted a real HA coated press-fit stem into the humerus and then selected the mini baseplate and the 36 standard poly and impacted that into position, this was the vitamin E polyethylene highly cross-linked, we then reduced the  shoulder with nice little pop.  We had good stability throughout a full range of motion, no significant stiffness.  We then did an anatomic subscapularis repair with the FiberWire suture and then irrigated thoroughly and closed deltopectoral incision  with 0 Vicryl suture followed by 2-0 Vicryl for subcutaneous closure, 4-0 Monocryl for skin.  Steri-Strips were applied followed by sterile dressing.  The patient tolerated the surgery well.  TN/NUANCE  D:08/06/2017 T:08/06/2017 JOB:[id number]

## 2017-08-06 NOTE — Anesthesia Procedure Notes (Signed)
Procedure Name: Intubation Date/Time: 08/06/2017 7:40 AM Performed by: Kyung Rudd, CRNA Pre-anesthesia Checklist: Patient identified, Emergency Drugs available, Suction available and Patient being monitored Patient Re-evaluated:Patient Re-evaluated prior to induction Oxygen Delivery Method: Circle system utilized Preoxygenation: Pre-oxygenation with 100% oxygen Induction Type: IV induction Ventilation: Mask ventilation without difficulty Laryngoscope Size: Mac and 4 Grade View: Grade I Tube type: Oral Tube size: 7.0 mm Number of attempts: 1 Airway Equipment and Method: Stylet Placement Confirmation: ETT inserted through vocal cords under direct vision,  positive ETCO2 and breath sounds checked- equal and bilateral Secured at: 20 cm Tube secured with: Tape Dental Injury: Teeth and Oropharynx as per pre-operative assessment

## 2017-08-06 NOTE — Brief Op Note (Signed)
08/06/2017  10:19 AM  PATIENT:  Stephanie [name]  67 y.o. female  PRE-OPERATIVE DIAGNOSIS:  Right shoulder rotator cuff tear, osteoarthritis  POST-OPERATIVE DIAGNOSIS:  Right shoulder rotator cuff tear, osteoarthritis  PROCEDURE:  Procedure(s): RIGHT REVERSE SHOULDER ARTHROPLASTY (Right)  Biomet Comprehensive  SURGEON:  Surgeon(s) and Role:    Netta Cedars, MD - Primary  PHYSICIAN ASSISTANT:   ASSISTANTS: Ventura Bruns, PA-C   ANESTHESIA:   regional and general  EBL:  150 mL   BLOOD ADMINISTERED:none  DRAINS: none   LOCAL MEDICATIONS USED:  MARCAINE     SPECIMEN:  No Specimen  DISPOSITION OF SPECIMEN:  N/A  COUNTS:  YES  TOURNIQUET:  * No tourniquets in log *  DICTATION: other dictation [phone]  PLAN OF CARE: Admit to inpatient   PATIENT DISPOSITION:  PACU - hemodynamically stable.   Delay start of Pharmacological VTE agent (>24hrs) due to surgical blood loss or risk of bleeding: not applicable

## 2017-08-06 NOTE — Anesthesia Postprocedure Evaluation (Signed)
Anesthesia Post Note  Patient: Stephanie [name]  Procedure(s) Performed: RIGHT REVERSE SHOULDER ARTHROPLASTY (Right Shoulder)     Patient location during evaluation: PACU Anesthesia Type: General Level of consciousness: awake and alert Pain management: pain level controlled Vital Signs Assessment: post-procedure vital signs reviewed and stable Respiratory status: spontaneous breathing, nonlabored ventilation and respiratory function stable Cardiovascular status: blood pressure returned to baseline and stable Postop Assessment: no apparent nausea or vomiting Anesthetic complications: no    Last Vitals:  Vitals:   08/06/17 1022 08/06/17 1037  BP: 122/68 112/65  Pulse: 76 75  Resp: (!) 22 18  Temp:    SpO2: 97% 96%    Last Pain:  Vitals:   08/06/17 1037  TempSrc:   PainSc: 0-No pain                 Audry Pili

## 2017-08-07 LAB — BASIC METABOLIC PANEL
Anion gap: 11 (ref 5–15)
BUN: 47 mg/dL — AB (ref 6–20)
CHLORIDE: 98 mmol/L — AB (ref 101–111)
CO2: 27 mmol/L (ref 22–32)
CREATININE: 1.9 mg/dL — AB (ref 0.44–1.00)
Calcium: 8.5 mg/dL — ABNORMAL LOW (ref 8.9–10.3)
GFR calc non Af Amer: 26 mL/min — ABNORMAL LOW (ref >60)
GFR, EST AFRICAN AMERICAN: 30 mL/min — AB (ref >60)
Glucose, Bld: 378 mg/dL — ABNORMAL HIGH (ref 65–99)
Potassium: 3.7 mmol/L (ref 3.5–5.1)
Sodium: 136 mmol/L (ref 135–145)

## 2017-08-07 LAB — GLUCOSE, CAPILLARY
GLUCOSE-CAPILLARY: 198 mg/dL — AB (ref 65–99)
GLUCOSE-CAPILLARY: 300 mg/dL — AB (ref 65–99)
GLUCOSE-CAPILLARY: 391 mg/dL — AB (ref 65–99)
GLUCOSE-CAPILLARY: 414 mg/dL — AB (ref 65–99)
Glucose-Capillary: 264 mg/dL — ABNORMAL HIGH (ref 65–99)
Glucose-Capillary: 251 mg/dL — ABNORMAL HIGH (ref 65–99)

## 2017-08-07 LAB — HEMOGLOBIN AND HEMATOCRIT, BLOOD
HCT: 33.9 % — ABNORMAL LOW (ref 36.0–46.0)
Hemoglobin: 10.4 g/dL — ABNORMAL LOW (ref 12.0–15.0)

## 2017-08-07 NOTE — Progress Notes (Signed)
CRITICAL VALUE ALERT  Critical Value:  CBG 414  Date & Time Notied:  08/07/2017 0030  Provider Notified: Stann Mainland  Orders Received/Actions taken: no orders for insulin; per MD "keep pt hydrated"

## 2017-08-07 NOTE — Evaluation (Signed)
Occupational Therapy Evaluation Patient Details Name: Stephanie [name] MRN: [id number] DOB: 08-31-1950 Today's Date: 08/07/2017    History of Present Illness Pt is a 67 y.o. female s/p R reverse shoulder arthroplasty. PMH significant for anemia, arthritis, astham, COPD, DVT, diabetes mellitus, fibromyalgia, GERD, hypercholesteremia, hypertension, hyperthyroidism, irritable bowel syndrome, inner ear disease, monoclonal gammopathy of unknown significance, postoperative nausea and vomiting, and tachycardia.   Clinical Impression   PTA, pt was using a cane or RW for longer distance ambulation. She was lethargic on evaluation but motivated to participate. Pt currently requiring max assist for UB ADL and LB ADL and min assist for toilet transfers. Initiated education concerning conservative shoulder protocol and AROM elbow/wrist/hand HEP and she verbalizes understanding. Pt would benefit from continued OT services while admitted to improve independence and safety with ADL and functional mobility prior to returning home. Recommend 24 hour assistance from daughter initially. OT will continue to follow while admitted.     Follow Up Recommendations  Follow surgeon's recommendation for DC plan and follow-up therapies;Supervision/Assistance - 24 hour    Equipment Recommendations  None recommended by OT    Recommendations for Other Services PT consult     Precautions / Restrictions Precautions Precautions: Shoulder Type of Shoulder Precautions: Conservative protocol: NWB RUE, sling at all times except ADL/exercise, AROM elbow/wrist/hand, no AROM R shoulder, no PROM R shoulder. Shoulder Interventions: Shoulder sling/immobilizer;Off for dressing/bathing/exercises Precaution Booklet Issued: Yes (comment) Precaution Comments: Reviewed shoulder precautions thoroughly with handout provided.  Required Braces or Orthoses: Sling(R shoulder) Restrictions Weight Bearing Restrictions: Yes RUE Weight Bearing: Non  weight bearing      Mobility Bed Mobility               General bed mobility comments: OOB in recliner on my arrival.   Transfers Overall transfer level: Needs assistance Equipment used: 1 person hand held assist Transfers: Sit to/from Stand Sit to Stand: Min assist         General transfer comment: Min assist for stability.     Balance Overall balance assessment: Needs assistance Sitting-balance support: No upper extremity supported;Feet supported Sitting balance-Leahy Scale: Fair     Standing balance support: No upper extremity supported;Single extremity supported;During functional activity Standing balance-Leahy Scale: Poor Standing balance comment: Single UE support during dynamic tasks.                            ADL either performed or assessed with clinical judgement   ADL Overall ADL's : Needs assistance/impaired Eating/Feeding: Minimal assistance;Sitting   Grooming: Minimal assistance;Sitting   Upper Body Bathing: Maximal assistance;Sitting   Lower Body Bathing: Maximal assistance;Sit to/from stand   Upper Body Dressing : Maximal assistance;Sitting   Lower Body Dressing: Maximal assistance;Sit to/from stand   Toilet Transfer: Minimal assistance;Ambulation Toilet Transfer Details (indicate cue type and reason): pt with decreased stability on her feet requiring assistance for balance and increased time Toileting- Clothing Manipulation and Hygiene: Minimal assistance;Sit to/from stand       Functional mobility during ADLs: Minimal assistance General ADL Comments: Initiated educated concerning compensatory strategies for ADL participation.      Vision Patient Visual Report: No change from baseline Vision Assessment?: No apparent visual deficits     Perception     Praxis      Pertinent Vitals/Pain Pain Assessment: Faces Faces Pain Scale: Hurts whole lot Pain Location: R shoulder Pain Descriptors / Indicators: Aching;Operative  site guarding;Spasm Pain Intervention(s): Limited activity within patient's  tolerance;Repositioned;Premedicated before session     Hand Dominance Right   Extremity/Trunk Assessment Upper Extremity Assessment Upper Extremity Assessment: RUE deficits/detail RUE Deficits / Details: No AROM/PROM R shoulder per protocol. Effects of nerve block diminishing but decreased motor control of hand to elbow movements.  RUE Sensation: decreased light touch   Lower Extremity Assessment Lower Extremity Assessment: Generalized weakness       Communication Communication Communication: No difficulties   Cognition Arousal/Alertness: Lethargic;Suspect due to medications(falling asleep at times) Behavior During Therapy: [facility] for tasks assessed/performed Overall Cognitive Status: Within Functional Limits for tasks assessed                                     General Comments  Pt reports that she will not be able to shower until stitches disintegrate per MD.     Exercises Exercises: Shoulder Shoulder Exercises Elbow Flexion: AAROM;Right;10 reps;Seated Elbow Extension: AAROM;Left;10 reps;Seated Wrist Flexion: AROM;Left;10 reps;Seated Wrist Extension: AROM;Left;10 reps;Seated Digit Composite Flexion: AROM;Left;10 reps;Seated Composite Extension: AROM;Left;10 reps;Seated   Shoulder Instructions Shoulder Instructions Donning/doffing shirt without moving shoulder: Maximal assistance Method for sponge bathing under operated UE: Maximal assistance Donning/doffing sling/immobilizer: Maximal assistance Correct positioning of sling/immobilizer: Minimal assistance ROM for elbow, wrist and digits of operated UE: Supervision/safety Sling wearing schedule (on at all times/off for ADL's): Supervision/safety Positioning of UE while sleeping: Minimal assistance    Home Living Family/patient expects to be discharged to:: Private residence Living Arrangements: Children Available Help at  Discharge: Family;Available PRN/intermittently(pt will be alone for a few hours daily; lives with daughter) Type of Home: House             Bathroom Shower/Tub: Tub/shower unit         Home Equipment: Environmental consultant - 2 wheels;Cane - single point          Prior Functioning/Environment Level of Independence: Independent with assistive device(s)        Comments: Has been using a cane for longer distances and just received walker to use when needed.         OT Problem List: Decreased strength;Decreased range of motion;Decreased activity tolerance;Impaired balance (sitting and/or standing);Decreased safety awareness;Decreased knowledge of use of DME or AE;Decreased knowledge of precautions;Pain      OT Treatment/Interventions: Self-care/ADL training;Therapeutic exercise;Energy conservation;DME and/or AE instruction;Therapeutic activities;Patient/family education;Balance training    OT Goals(Current goals can be found in the care plan section) Acute Rehab OT Goals Patient Stated Goal: to go home tomorrow OT Goal Formulation: With patient Time For Goal Achievement: 08/21/17 Potential to Achieve Goals: Good ADL Goals Pt Will Perform Grooming: with modified independence;standing Pt Will Perform Upper Body Dressing: with min assist;sitting Pt Will Perform Lower Body Dressing: with min assist;sit to/from stand Pt Will Transfer to Toilet: with modified independence;ambulating;regular height toilet Pt Will Perform Toileting - Clothing Manipulation and hygiene: with modified independence;sit to/from stand  OT Frequency: Min 3X/week   Barriers to D/C:            Co-evaluation              AM-PAC PT "6 Clicks" Daily Activity     Outcome Measure Help from another person eating meals?: A Little Help from another person taking care of personal grooming?: A Little Help from another person toileting, which includes using toliet, bedpan, or urinal?: A Lot Help from another person  bathing (including washing, rinsing, drying)?: A Lot Help from another person  to put on and taking off regular upper body clothing?: A Lot Help from another person to put on and taking off regular lower body clothing?: A Lot 6 Click Score: 14   End of Session Equipment Utilized During Treatment: Other (comment)(R shoulder sling) Nurse Communication: Mobility status  Activity Tolerance: Patient tolerated treatment well Patient left: in chair;with call bell/phone within reach  OT Visit Diagnosis: Other abnormalities of gait and mobility (R26.89);Pain Pain - Right/Left: Right Pain - part of body: Shoulder                Time: 5631-4970 OT Time Calculation (min): 23 min Charges:  OT General Charges $OT Visit: 1 Visit OT Evaluation $OT Eval Moderate Complexity: 1 Mod OT Treatments $Self Care/Home Management : 8-22 mins G-Codes:     Norman Herrlich, MS OTR/L  Pager: Garrison A Natascha Edmonds 08/07/2017, 10:16 AM

## 2017-08-07 NOTE — Plan of Care (Signed)
  Problem: Safety: Goal: Ability to remain free from injury will improve 08/07/2017 0052 by Quincy Sheehan, RN Outcome: Progressing 08/06/2017 2054 by Quincy Sheehan, RN Outcome: Progressing   Problem: Education: Goal: Knowledge of the prescribed therapeutic regimen will improve 08/07/2017 0052 by Quincy Sheehan, RN Outcome: Progressing 08/06/2017 2054 by Quincy Sheehan, RN Outcome: Progressing Goal: Understanding of activity limitations/precautions following surgery will improve 08/07/2017 0052 by Quincy Sheehan, RN Outcome: Progressing 08/06/2017 2054 by Quincy Sheehan, RN Outcome: Progressing   Problem: Activity: Goal: Ability to tolerate increased activity will improve 08/07/2017 0052 by Quincy Sheehan, RN Outcome: Progressing 08/06/2017 2054 by Quincy Sheehan, RN Outcome: Progressing   Problem: Pain Management: Goal: Pain level will decrease with appropriate interventions 08/07/2017 0052 by Quincy Sheehan, RN Outcome: Progressing 08/06/2017 2054 by Quincy Sheehan, RN Outcome: Progressing

## 2017-08-07 NOTE — Progress Notes (Signed)
   Subjective: 1 Day Post-Op Procedure(s) (LRB): RIGHT REVERSE SHOULDER ARTHROPLASTY (Right)  C/o moderate pain this morning especially after the block wore off Denies any numbness or tingling  Otherwise doing fair Patient reports pain as moderate.  Objective:   VITALS:   Vitals:   08/07/17 0009 08/07/17 0454  BP:  114/63  Pulse:  78  Resp:    Temp: 98.1 F (36.7 C) 98.1 F (36.7 C)  SpO2:  100%   Right shoulder incision healing well Dressing intact with no signs of drainage  nv intact distally Sling in place  LABS Recent Labs    08/04/17 1032 08/07/17 0320  HGB 11.3* 10.4*  HCT 35.7* 33.9*    Recent Labs    08/04/17 1032 08/07/17 0320  NA 138 136  K 3.0* 3.7  BUN 44* 47*  CREATININE 1.54* 1.90*  GLUCOSE 128* 378*     Assessment/Plan: 1 Day Post-Op Procedure(s) (LRB): RIGHT REVERSE SHOULDER ARTHROPLASTY (Right) PT/OT today Pain management Pulmonary toilet Possible d/c tomorrow depending on her progress     Kathrynn Speed, [place] is now [employer] [address] [place], [place], [place] [address] Phone: [phone] [email or web address] Facebook  [employer]

## 2017-08-08 DIAGNOSIS — M19011 Primary osteoarthritis, right shoulder: Secondary | ICD-10-CM

## 2017-08-08 LAB — GLUCOSE, CAPILLARY
GLUCOSE-CAPILLARY: 260 mg/dL — AB (ref 65–99)
Glucose-Capillary: 219 mg/dL — ABNORMAL HIGH (ref 65–99)
Glucose-Capillary: 268 mg/dL — ABNORMAL HIGH (ref 65–99)
Glucose-Capillary: 248 mg/dL — ABNORMAL HIGH (ref 65–99)

## 2017-08-08 NOTE — Progress Notes (Signed)
Occupational Therapy Treatment Patient Details Name: Stephanie [name] MRN: [id number] DOB: 05/22/1950 Today's Date: 08/08/2017    History of present illness Pt is a 67 y.o. female s/p R reverse shoulder arthroplasty. PMH significant for anemia, arthritis, astham, COPD, DVT, diabetes mellitus, fibromyalgia, GERD, hypercholesteremia, hypertension, hyperthyroidism, irritable bowel syndrome, inner ear disease, monoclonal gammopathy of unknown significance, postoperative nausea and vomiting, and tachycardia.   OT comments  Pt progressing towards established OT goals. Pt performing toileting with Min Guard A. Providing education for compensatory techniques for UB dressing. Pt requiring Min A for donning/doffing shirt and Max A for sling management. Pt performing elbow, wrist, and hand exercises while seated in recliner. PA present during session and pt planning for dc home tomorrow morning. Will continue to follow acutely as admitted to facilitate safe dc.    Follow Up Recommendations  Follow surgeon's recommendation for DC plan and follow-up therapies;Supervision/Assistance - 24 hour    Equipment Recommendations  None recommended by OT    Recommendations for Other Services PT consult    Precautions / Restrictions Precautions Precautions: Shoulder Type of Shoulder Precautions: Conservative protocol: NWB RUE, sling at all times except ADL/exercise, AROM elbow/wrist/hand, no AROM R shoulder, no PROM R shoulder. Shoulder Interventions: Shoulder sling/immobilizer;Off for dressing/bathing/exercises Precaution Booklet Issued: Yes (comment) Precaution Comments: Reviewed shoulder precautions thoroughly with handout provided.  Required Braces or Orthoses: Sling(R shoulder) Restrictions Weight Bearing Restrictions: Yes RUE Weight Bearing: Non weight bearing       Mobility Bed Mobility Overal bed mobility: Needs Assistance Bed Mobility: Supine to Sit     Supine to sit: Supervision;HOB elevated      General bed mobility comments: supervision for safety  Transfers Overall transfer level: Needs assistance   Transfers: Sit to/from Stand Sit to Stand: Min guard         General transfer comment: MIn GUard A for safety    Balance Overall balance assessment: Needs assistance Sitting-balance support: No upper extremity supported;Feet supported Sitting balance-Leahy Scale: Fair     Standing balance support: No upper extremity supported;Single extremity supported;During functional activity Standing balance-Leahy Scale: Fair Standing balance comment: Able to maintain static standing without assistance                           ADL either performed or assessed with clinical judgement   ADL Overall ADL's : Needs assistance/impaired     Grooming: Min guard;Standing;Wash/dry hands Grooming Details (indicate cue type and reason): Min Guard A for safety in standing. Pt completing hand hygiene afgter toileting         Upper Body Dressing : Minimal assistance;Sitting;Maximal assistance Upper Body Dressing Details (indicate cue type and reason): Max A for sling management. Reviewing good sling positioning to reduce pain. Pt requiring Min A for donning/doffing her shirt and then night gown. Providing education for compensatory techniquies to increase safety and independence as well as reduce pain. Requiring Min A to adjust right sleeve over RUE     Toilet Transfer: Min guard;Ambulation;Regular Glass blower/designer Details (indicate cue type and reason): Pt performing toileting with MIn Guard A for safety Toileting- Clothing Manipulation and Hygiene: Supervision/safety;Sit to/from stand       Functional mobility during ADLs: Min guard General ADL Comments: Pt demonstrating increased occuaptional performance. Performing toileting and UB dressing. Providing education on compensatory techniques. Pt reporting significant pain but stating "I am a fighter till the end."      Vision   Vision Assessment?:  No apparent visual deficits   Perception     Praxis      Cognition Arousal/Alertness: Awake/alert Behavior During Therapy: WFL for tasks assessed/performed Overall Cognitive Status: Within Functional Limits for tasks assessed                                 General Comments: Very pleasant and motivated        Exercises Exercises: Shoulder;Hand exercises Shoulder Exercises Elbow Flexion: Right;10 reps;Seated;AROM Elbow Extension: 10 reps;Seated;Right;AROM Wrist Flexion: AROM;10 reps;Seated;Right Wrist Extension: AROM;10 reps;Seated;Right Digit Composite Flexion: AROM;10 reps;Seated;Right Composite Extension: AROM;10 reps;Seated;Right Hand Exercises Forearm Supination: AROM;Right;10 reps;Seated Forearm Pronation: AROM;Right;10 reps;Seated   Shoulder Instructions Shoulder Instructions Donning/doffing shirt without moving shoulder: Minimal assistance Method for sponge bathing under operated UE: Maximal assistance Donning/doffing sling/immobilizer: Maximal assistance Correct positioning of sling/immobilizer: Minimal assistance ROM for elbow, wrist and digits of operated UE: Supervision/safety Sling wearing schedule (on at all times/off for ADL's): Supervision/safety Positioning of UE while sleeping: Minimal assistance     General Comments Educating pt on good positioning for resting in recliner.     Pertinent Vitals/ Pain       Pain Assessment: Faces Faces Pain Scale: Hurts whole lot Pain Location: R shoulder Pain Descriptors / Indicators: Aching;Operative site guarding;Spasm Pain Intervention(s): Monitored during session;Limited activity within patient's tolerance;Repositioned;Patient requesting pain meds-RN notified  Home Living                                          Prior Functioning/Environment              Frequency  Min 3X/week        Progress Toward Goals  OT Goals(current goals  can now be found in the care plan section)  Progress towards OT goals: Progressing toward goals  Acute Rehab OT Goals Patient Stated Goal: to go home tomorrow OT Goal Formulation: With patient Time For Goal Achievement: 08/21/17 Potential to Achieve Goals: Good ADL Goals Pt Will Perform Grooming: with modified independence;standing Pt Will Perform Upper Body Dressing: with min assist;sitting Pt Will Perform Lower Body Dressing: with min assist;sit to/from stand Pt Will Transfer to Toilet: with modified independence;ambulating;regular height toilet Pt Will Perform Toileting - Clothing Manipulation and hygiene: with modified independence;sit to/from stand  Plan Discharge plan remains appropriate    Co-evaluation                 AM-PAC PT "6 Clicks" Daily Activity     Outcome Measure   Help from another person eating meals?: A Little Help from another person taking care of personal grooming?: A Little Help from another person toileting, which includes using toliet, bedpan, or urinal?: A Lot Help from another person bathing (including washing, rinsing, drying)?: A Lot Help from another person to put on and taking off regular upper body clothing?: A Lot Help from another person to put on and taking off regular lower body clothing?: A Lot 6 Click Score: 14    End of Session Equipment Utilized During Treatment: Other (comment)(R shoulder sling)  OT Visit Diagnosis: Other abnormalities of gait and mobility (R26.89);Pain Pain - Right/Left: Right Pain - part of body: Shoulder   Activity Tolerance Patient tolerated treatment well   Patient Left in chair;with call bell/phone within reach   Nurse Communication Mobility status;Patient requests pain meds  Time: 7573-2256 OT Time Calculation (min): 28 min  Charges: OT General Charges $OT Visit: 1 Visit OT Treatments $Self Care/Home Management : 23-37 mins  Day, OTR/L Acute Rehab Pager:  [phone] Office: [place] 08/08/2017, 8:39 AM

## 2017-08-08 NOTE — Progress Notes (Addendum)
   Subjective: 2 Days Post-Op Procedure(s) (LRB): RIGHT REVERSE SHOULDER ARTHROPLASTY (Right)  Still c/o moderate pain in the shoulder Working with therapy currently Does not have anyone at home to help until tomorrow Denies any new symptoms or issues Patient reports pain as moderate.  Objective:   VITALS:   Vitals:   08/07/17 2126 08/08/17 0406  BP:  114/66  Pulse:  83  Resp:    Temp:  98.4 F (36.9 C)  SpO2: 94% 100%    Right shoulder incision healing well nv intact distally No rashes or edema distally Moderate guarding with rom  LABS Recent Labs    08/07/17 0320  HGB 10.4*  HCT 33.9*    Recent Labs    08/07/17 0320  NA 136  K 3.7  BUN 47*  CREATININE 1.90*  GLUCOSE 378*     Assessment/Plan: 2 Days Post-Op Procedure(s) (LRB): RIGHT REVERSE SHOULDER ARTHROPLASTY (Right) Continue PT/OT Plan for d/c tomorrow when family is available Pain management Pulmonary toilet    Brad Luna [place], [place] is now [employer]  [address], [place], [place] [address] Phone: [phone] [email or web address] Facebook  [employer]   Agree with above assessment.  She looks great this afternoon and will be all set for discharge tomorrow. Aquacel dressing in place.  May shower as incision protected.  Netta Cedars, MD

## 2017-08-09 ENCOUNTER — Encounter (HOSPITAL_COMMUNITY): Payer: Self-pay | Admitting: Orthopedic Surgery

## 2017-08-09 LAB — GLUCOSE, CAPILLARY
GLUCOSE-CAPILLARY: 151 mg/dL — AB (ref 65–99)
Glucose-Capillary: 97 mg/dL (ref 65–99)

## 2017-08-09 NOTE — Progress Notes (Signed)
Occupational Therapy Treatment and Discharge  Patient Details Name: Stephanie [name] MRN: [id number] DOB: 1950/12/01 Today's Date: 08/09/2017    History of present illness Pt is a 67 y.o. female s/p R reverse shoulder arthroplasty. PMH significant for anemia, arthritis, astham, COPD, DVT, diabetes mellitus, fibromyalgia, GERD, hypercholesteremia, hypertension, hyperthyroidism, irritable bowel syndrome, inner ear disease, monoclonal gammopathy of unknown significance, postoperative nausea and vomiting, and tachycardia.   OT comments  Patient progressing well.  She demonstrates ability to complete UB dressing with supervision but requires min assist for sling management requiring cueing to adhere to no shoulder ROM--agreeable to assist at home for sling management.  Able to demonstrate completion of R UE exercises without verbal cueing.  Continue to recommend supervision for mobility and ADLs at dc.  Patient has adequately met goals for dc home with support. No further acute OT needs identified, signing OFF.     Follow Up Recommendations  Follow surgeon's recommendation for DC plan and follow-up therapies;Supervision/Assistance - 24 hour    Equipment Recommendations  None recommended by OT    Recommendations for Other Services      Precautions / Restrictions Precautions Precautions: Shoulder Type of Shoulder Precautions: Conservative protocol: NWB RUE, sling at all times except ADL/exercise, AROM elbow/wrist/hand, no AROM R shoulder, no PROM R shoulder. Shoulder Interventions: Shoulder sling/immobilizer;Off for dressing/bathing/exercises Precaution Booklet Issued: No Precaution Comments: issued in previous OT session Required Braces or Orthoses: Sling Restrictions Weight Bearing Restrictions: Yes RUE Weight Bearing: Non weight bearing       Mobility Bed Mobility Overal bed mobility: Needs Assistance Bed Mobility: Sit to Supine       Sit to supine: Supervision   General bed mobility  comments: supervision for safety, reviewed education on positioning and support to UE(recommendations to wear sling in bed)  Transfers Overall transfer level: Needs assistance   Transfers: Sit to/from Stand Sit to Stand: Supervision         General transfer comment: supervision for safety    Balance Overall balance assessment: Needs assistance Sitting-balance support: No upper extremity supported;Feet supported Sitting balance-Leahy Scale: Good     Standing balance support: No upper extremity supported;Single extremity supported;During functional activity Standing balance-Leahy Scale: Fair Standing balance comment: Able to maintain static and dynamic within base of support standing without assistance                           ADL either performed or assessed with clinical judgement   ADL           Upper Body Bathing: Supervision/ safety Upper Body Bathing Details (indicate cue type and reason): able to verbalize technique to wash underneath R shoulder      Upper Body Dressing : Minimal assistance Upper Body Dressing Details (indicate cue type and reason): patient report SU for UB dressing, able to verbalize technique; minA for management of sling given tactile and verbal cueing to avoid ROM at shoulder; educated on having assistance at home to ensure no ROM- patient agreable.  Lower Body Dressing: Supervision/safety Lower Body Dressing Details (indicate cue type and reason): per patient report prior to therapist entering room Toilet Transfer: Supervision/safety;Ambulation;BSC           Functional mobility during ADLs: Supervision/safety(short distance from [facility] in bathroom to EOB) General ADL Comments: patient participates well, demostrating good awareness and recall of compensatory techniques for ADL participation      Vision       Perception  Praxis      Cognition Arousal/Alertness: Awake/alert Behavior During Therapy: WFL for tasks  assessed/performed Overall Cognitive Status: Within Functional Limits for tasks assessed                                 General Comments: requires cueing to adhere to no ROM at shoulder while donning sling        Exercises Shoulder Exercises Elbow Flexion: Right;10 reps;Seated;AROM Elbow Extension: 10 reps;Seated;Right;AROM Wrist Flexion: AROM;10 reps;Seated;Right Wrist Extension: AROM;10 reps;Seated;Right Digit Composite Flexion: AROM;10 reps;Seated;Right Composite Extension: AROM;10 reps;Seated;Right Hand Exercises Forearm Supination: AROM;Right;10 reps;Seated Forearm Pronation: AROM;Right;10 reps;Seated   Shoulder Instructions Shoulder Instructions Donning/doffing shirt without moving shoulder: Supervision/safety Method for sponge bathing under operated UE: Supervision/safety Donning/doffing sling/immobilizer: Minimal assistance Correct positioning of sling/immobilizer: Supervision/safety ROM for elbow, wrist and digits of operated UE: Supervision/safety Sling wearing schedule (on at all times/off for ADL's): Supervision/safety Positioning of UE while sleeping: Supervision/safety     General Comments      Pertinent Vitals/ Pain       Pain Assessment: Faces Faces Pain Scale: Hurts little more Pain Location: R shoulder Pain Descriptors / Indicators: Aching;Operative site guarding Pain Intervention(s): Limited activity within patient's tolerance;Monitored during session;Repositioned;Ice applied  Home Living                                          Prior Functioning/Environment              Frequency  Min 3X/week        Progress Toward Goals  OT Goals(current goals can now be found in the care plan section)  Progress towards OT goals: (goals adequate for discharge, will have assistance at dc  )  Acute Rehab OT Goals Patient Stated Goal: to go home today OT Goal Formulation: With patient Time For Goal Achievement:  08/21/17 Potential to Achieve Goals: Good  Plan All goals met and education completed, patient discharged from OT services;Discharge plan remains appropriate    Co-evaluation                 AM-PAC PT "6 Clicks" Daily Activity     Outcome Measure   Help from another person eating meals?: A Little Help from another person taking care of personal grooming?: A Little Help from another person toileting, which includes using toliet, bedpan, or urinal?: A Little Help from another person bathing (including washing, rinsing, drying)?: A Little Help from another person to put on and taking off regular upper body clothing?: A Little Help from another person to put on and taking off regular lower body clothing?: A Little 6 Click Score: 18    End of Session Equipment Utilized During Treatment: Other (comment)(R shoulder sling)  OT Visit Diagnosis: Other abnormalities of gait and mobility (R26.89);Pain Pain - Right/Left: Right Pain - part of body: Shoulder   Activity Tolerance Patient tolerated treatment well   Patient Left with call bell/phone within reach;in bed;with SCD's reapplied   Nurse Communication          Time: 4098-1191 OT Time Calculation (min): 16 min  Charges: OT General Charges $OT Visit: 1 Visit OT Treatments $Self Care/Home Management : 8-22 mins  Delight Stare, OTR/L  Pager [place] 08/09/2017, 12:03 PM

## 2017-08-09 NOTE — Progress Notes (Signed)
   Subjective: 3 Days Post-Op Procedure(s) (LRB): RIGHT REVERSE SHOULDER ARTHROPLASTY (Right)  Still c/o moderate pain in the right shoulder Denies any new symptoms or issues Ready for d/c home Patient reports pain as moderate.  Objective:   VITALS:   Vitals:   08/09/17 0808 08/09/17 0810  BP:    Pulse:    Resp:    Temp:    SpO2: 95% 95%    Right shoulder incision healing well nv intact distally No rashes or edema Sling in place  LABS Recent Labs    08/07/17 0320  HGB 10.4*  HCT 33.9*    Recent Labs    08/07/17 0320  NA 136  K 3.7  BUN 47*  CREATININE 1.90*  GLUCOSE 378*     Assessment/Plan: 3 Days Post-Op Procedure(s) (LRB): RIGHT REVERSE SHOULDER ARTHROPLASTY (Right) D/c home today F/u in 2 weeks Pain management Continue gentle exercises at home Bedside rail script on the chart   Brad Luna [place], [place] is now [facility]  [place] [address] [place], [place], [place] [address] Phone: [phone] [email or web address] Facebook  [employer]

## 2017-08-09 NOTE — Plan of Care (Signed)
  Problem: Safety: Goal: Ability to remain free from injury will improve Outcome: Progressing   Problem: Activity: Goal: Ability to tolerate increased activity will improve Outcome: Progressing   Problem: Pain Management: Goal: Pain level will decrease with appropriate interventions Outcome: Progressing

## 2017-08-09 NOTE — Progress Notes (Signed)
Provided discharge education/instructions, all questions and concerns addressed, Pt not in distress, discharged home with belongings accompanied by family members.

## 2017-08-09 NOTE — Care Management Important Message (Signed)
Important Message  Patient Details  Name: Stephanie [name] MRN: [id number] Date of Birth: [birth year]   Medicare Important Message Given:  Yes    Ian Cavey Montine Circle 08/09/2017, 3:56 PM

## 2017-08-09 NOTE — Discharge Summary (Signed)
Orthopedic Discharge Summary        Physician Discharge Summary  [name] MRN: [id number] DOB/AGE: Jun 18, 1950 67 y.o.  Admit date: 08/06/2017 Discharge date: 08/09/2017   Procedures:  Procedure(s) (LRB): RIGHT REVERSE SHOULDER ARTHROPLASTY (Right)  Attending Physician:  Dr. Esmond Plants  Admission Diagnoses:   Right shoulder cuff arthropathy  Discharge Diagnoses:  Right shoulder cuff arthropathy   Past Medical History:  Diagnosis Date  . Anemia   . Arthritis   . Asthma   . COPD (chronic obstructive pulmonary disease) ([place])   . Deep vein thrombosis (DVT) of both lower extremities ([place]) 06/27/2015  . Diabetes mellitus   . Fibromyalgia   . GERD (gastroesophageal reflux disease)   . H/O hiatal hernia   . Hypercholesteremia   . Hypertension   . Hyperthyroidism   . IBS (irritable bowel syndrome)   . Inner ear disease   . 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 PONV (postoperative nausea and vomiting)   . Tachycardia     PCP: Jani Gravel, MD   Discharged Condition: fair  Hospital Course:  Patient underwent the above stated procedure on 08/06/2017. Patient tolerated the procedure well and brought to the recovery room in good condition and subsequently to the floor. Patient had an uncomplicated hospital course and was stable for discharge.   Disposition: Discharge disposition: 01-Home or Self Care      with follow up in 2 weeks   Follow-up Information    Netta Cedars, MD. Call in 2 weeks.   Specialty:  Orthopedic Surgery Why:  [phone] Contact information: [address] [place] [address] [phone]           Discharge Instructions    Call MD / Call 911   Complete by:  As directed    If you experience chest pain or shortness of breath, CALL 911 and be transported to the hospital emergency room.  If you develope a fever above 101 F, pus (white drainage) or increased drainage or redness  at the wound, or calf pain, call your surgeon's office.   Constipation Prevention   Complete by:  As directed    Drink plenty of fluids.  Prune juice may be helpful.  You may use a stool softener, such as Colace (over the counter) 100 mg twice a day.  Use MiraLax (over the counter) for constipation as needed.   Diet - low sodium heart healthy   Complete by:  As directed    Increase activity slowly as tolerated   Complete by:  As directed       Allergies as of 08/09/2017      Reactions   Tetracyclines & Related Anaphylaxis   Banana Hives, Nausea And Vomiting   Penicillins Rash, Other (See Comments)   Has patient had a PCN reaction causing immediate rash, facial/tongue/throat swelling, SOB or lightheadedness with hypotension: No Has patient had a PCN reaction causing severe rash involving mucus membranes or skin necrosis: No Has patient had a PCN reaction that required hospitalization No Has patient had a PCN reaction occurring within the last 10 years: No If all of the above answers are "NO", then may proceed with Cephalosporin use.      Medication List    TAKE these medications   acetaminophen 500 MG tablet Commonly known as:  TYLENOL Take 1,000 mg by mouth 3 (three) times daily.   albuterol (2.5 MG/3ML) 0.083% nebulizer solution Commonly known as:  PROVENTIL Take 2.5  mg by nebulization every 6 (six) hours as needed for wheezing or shortness of breath.   PROAIR RESPICLICK 696 (90 Base) MCG/ACT Aepb Generic drug:  Albuterol Sulfate Inhale 2 puffs into the lungs every 6 (six) hours.   albuterol 108 (90 Base) MCG/ACT inhaler Commonly known as:  PROVENTIL HFA;VENTOLIN HFA Inhale 2 puffs into the lungs every 6 (six) hours as needed for wheezing or shortness of breath.   apixaban 5 MG Tabs tablet Commonly known as:  ELIQUIS Take 10 mg by mouth BID through Friday 5/19 evening. Take 5mg  by mouth BID starting 5/20 AM. What changed:    how much to take  how to take this  when  to take this  additional instructions   ARNUITY ELLIPTA 100 MCG/ACT Aepb Generic drug:  Fluticasone Furoate Inhale 1 puff into the lungs daily.   cetirizine 10 MG tablet Commonly known as:  ZYRTEC Take 10 mg by mouth daily.   CINNAMON PO Take 2-3 capsules by mouth 2 (two) times daily as needed (high blood sugar).   clobetasol ointment 0.05 % Commonly known as:  TEMOVATE Apply 1 application topically 2 (two) times daily as needed (rash).   Cod Liver Oil Caps Take 1 capsule by mouth at bedtime.   dexlansoprazole 60 MG capsule Commonly known as:  DEXILANT Take 1 capsule (60 mg total) by mouth daily.   diphenhydrAMINE 25 MG tablet Commonly known as:  BENADRYL Take 25 mg by mouth at bedtime as needed.   Doxepin HCl 5 % Crea apply 2 grams (2 grams=2 inches) to affected area(s) 3 times daily. Takes as needed   DULoxetine 30 MG capsule Commonly known as:  CYMBALTA Take 30 mg by mouth 2 (two) times daily.   fluticasone 27.5 MCG/SPRAY nasal spray Commonly known as:  VERAMYST Place 2 sprays into the nose daily.   HUMALOG KWIKPEN 100 UNIT/ML KiwkPen Generic drug:  insulin lispro Inject 30-35 Units into the skin 3 (three) times daily. Hold if blood sugar is 100, if blood sugar is 200 or higher take 35   HYDROcodone-acetaminophen 5-325 MG tablet Commonly known as:  NORCO Take 1-2 tablets by mouth every 6 (six) hours as needed for moderate pain.   hydrOXYzine 25 MG tablet Commonly known as:  ATARAX/VISTARIL Take 25 mg by mouth every 8 (eight) hours as needed for itching.   LANTUS SOLOSTAR 100 UNIT/ML Solostar Pen Generic drug:  Insulin Glargine Inject 75-80 Units into the skin 2 (two) times daily. 75 units in am, 80 units at night   levothyroxine 100 MCG tablet Commonly known as:  SYNTHROID, LEVOTHROID Take 100 mcg by mouth daily before breakfast.   lidocaine 5 % ointment Commonly known as:  XYLOCAINE APPLY 2-3 GRAMS TO AFFECTED AREA TWICE DAILY AS NEEDED     lisinopril 2.5 MG tablet Commonly known as:  PRINIVIL,ZESTRIL Take 2.5 mg by mouth daily.   lubiprostone 8 MCG capsule Commonly known as:  AMITIZA Take 1 capsule (8 mcg total) by mouth 2 (two) times daily with a meal.   LYRICA 75 MG capsule Generic drug:  pregabalin Take 75 mg by mouth 2 (two) times daily.   Melatonin 10 MG Tabs Take 10 mg by mouth at bedtime.   methocarbamol 500 MG tablet Commonly known as:  ROBAXIN Take 1 tablet (500 mg total) by mouth 3 (three) times daily as needed.   metolazone 5 MG tablet Commonly known as:  ZAROXOLYN Take 1 tablet (5 mg total) by mouth 2 (two) times daily.  metoprolol tartrate 100 MG tablet Commonly known as:  LOPRESSOR Take 100 mg by mouth 2 (two) times daily.   metoprolol tartrate 25 MG tablet Commonly known as:  LOPRESSOR Take 25 mg by mouth 2 (two) times daily.   mometasone 0.1 % cream Commonly known as:  ELOCON Apply 1 application topically daily as needed (rash).   NOVOFINE 32G X 6 MM Misc Generic drug:  Insulin Pen Needle   pravastatin 80 MG tablet Commonly known as:  PRAVACHOL Take 80 mg by mouth at bedtime.   TESSALON PERLES 100 MG capsule Generic drug:  benzonatate Take 1-2 capsules by mouth daily as needed (cough).   tiZANidine 4 MG tablet Commonly known as:  ZANAFLEX Take 4 mg by mouth 4 (four) times daily.   torsemide 20 MG tablet Commonly known as:  DEMADEX Take 2 tablets (40 mg total) by mouth daily.   traMADol 50 MG tablet Commonly known as:  ULTRAM Take 1 tablet (50 mg total) by mouth every 6 (six) hours as needed.   TRULICITY 1.5 LP/3.7TK Sopn Generic drug:  Dulaglutide Inject 1.5 mg into the skin once a week.   vitamin B-12 1000 MCG tablet Commonly known as:  CYANOCOBALAMIN Take 1,000 mcg by mouth daily.   vitamin C 1000 MG tablet Take 1,000 mg by mouth daily.   Vitamin D 2000 units tablet Take 4,000 Units by mouth daily.   vitamin E 200 UNIT capsule Take 200 Units by mouth daily.          Signed: Ventura Bruns 08/09/2017, 9:19 AM  [facility] is now [employer] [address] [place], [place], [name] [address] Phone: [place]

## 2017-08-23 DIAGNOSIS — Z79891 Long term (current) use of opiate analgesic: Secondary | ICD-10-CM | POA: Diagnosis not present

## 2017-08-23 DIAGNOSIS — Z794 Long term (current) use of insulin: Secondary | ICD-10-CM | POA: Diagnosis not present

## 2017-08-23 DIAGNOSIS — M25511 Pain in right shoulder: Secondary | ICD-10-CM | POA: Diagnosis not present

## 2017-08-23 DIAGNOSIS — Z79899 Other long term (current) drug therapy: Secondary | ICD-10-CM | POA: Diagnosis not present

## 2017-08-23 DIAGNOSIS — E1165 Type 2 diabetes mellitus with hyperglycemia: Secondary | ICD-10-CM | POA: Diagnosis not present

## 2017-09-14 ENCOUNTER — Other Ambulatory Visit (HOSPITAL_COMMUNITY): Payer: Medicare Other

## 2017-09-15 ENCOUNTER — Observation Stay (HOSPITAL_COMMUNITY)
Admission: EM | Admit: 2017-09-15 | Discharge: 2017-09-16 | Disposition: A | Discharge status: Home / Self Care | Payer: Medicare Other | Attending: Internal Medicine | Admitting: Internal Medicine

## 2017-09-15 ENCOUNTER — Emergency Department (HOSPITAL_COMMUNITY): Payer: Medicare Other

## 2017-09-15 ENCOUNTER — Other Ambulatory Visit: Payer: Self-pay

## 2017-09-15 ENCOUNTER — Encounter (HOSPITAL_COMMUNITY): Payer: Self-pay | Admitting: *Deleted

## 2017-09-15 ENCOUNTER — Observation Stay (HOSPITAL_COMMUNITY): Payer: Medicare Other

## 2017-09-15 ENCOUNTER — Other Ambulatory Visit (HOSPITAL_COMMUNITY): Payer: Medicare Other

## 2017-09-15 DIAGNOSIS — R400 Somnolence: Secondary | ICD-10-CM

## 2017-09-15 DIAGNOSIS — I6523 Occlusion and stenosis of bilateral carotid arteries: Secondary | ICD-10-CM | POA: Diagnosis not present

## 2017-09-15 DIAGNOSIS — F418 Other specified anxiety disorders: Secondary | ICD-10-CM | POA: Diagnosis not present

## 2017-09-15 DIAGNOSIS — N183 Chronic kidney disease, stage 3 (moderate): Secondary | ICD-10-CM

## 2017-09-15 DIAGNOSIS — E119 Type 2 diabetes mellitus without complications: Secondary | ICD-10-CM | POA: Insufficient documentation

## 2017-09-15 DIAGNOSIS — T68XXXA Hypothermia, initial encounter: Secondary | ICD-10-CM

## 2017-09-15 DIAGNOSIS — Z79899 Other long term (current) drug therapy: Secondary | ICD-10-CM | POA: Insufficient documentation

## 2017-09-15 DIAGNOSIS — Z794 Long term (current) use of insulin: Secondary | ICD-10-CM | POA: Diagnosis not present

## 2017-09-15 DIAGNOSIS — I959 Hypotension, unspecified: Secondary | ICD-10-CM | POA: Diagnosis not present

## 2017-09-15 DIAGNOSIS — K219 Gastro-esophageal reflux disease without esophagitis: Secondary | ICD-10-CM | POA: Diagnosis not present

## 2017-09-15 DIAGNOSIS — R531 Weakness: Secondary | ICD-10-CM | POA: Insufficient documentation

## 2017-09-15 DIAGNOSIS — R680 Hypothermia, not associated with low environmental temperature: Secondary | ICD-10-CM | POA: Insufficient documentation

## 2017-09-15 DIAGNOSIS — R410 Disorientation, unspecified: Secondary | ICD-10-CM | POA: Diagnosis not present

## 2017-09-15 DIAGNOSIS — R4182 Altered mental status, unspecified: Secondary | ICD-10-CM | POA: Diagnosis present

## 2017-09-15 DIAGNOSIS — J449 Chronic obstructive pulmonary disease, unspecified: Secondary | ICD-10-CM | POA: Diagnosis not present

## 2017-09-15 DIAGNOSIS — E1022 Type 1 diabetes mellitus with diabetic chronic kidney disease: Secondary | ICD-10-CM

## 2017-09-15 DIAGNOSIS — Z87891 Personal history of nicotine dependence: Secondary | ICD-10-CM | POA: Diagnosis not present

## 2017-09-15 DIAGNOSIS — G459 Transient cerebral ischemic attack, unspecified: Secondary | ICD-10-CM | POA: Diagnosis not present

## 2017-09-15 DIAGNOSIS — Z7901 Long term (current) use of anticoagulants: Secondary | ICD-10-CM | POA: Diagnosis not present

## 2017-09-15 DIAGNOSIS — E039 Hypothyroidism, unspecified: Secondary | ICD-10-CM | POA: Diagnosis not present

## 2017-09-15 DIAGNOSIS — R4020 Unspecified coma: Secondary | ICD-10-CM | POA: Diagnosis not present

## 2017-09-15 DIAGNOSIS — T68XXXD Hypothermia, subsequent encounter: Secondary | ICD-10-CM | POA: Diagnosis not present

## 2017-09-15 DIAGNOSIS — E785 Hyperlipidemia, unspecified: Secondary | ICD-10-CM | POA: Diagnosis not present

## 2017-09-15 DIAGNOSIS — R42 Dizziness and giddiness: Secondary | ICD-10-CM | POA: Diagnosis not present

## 2017-09-15 LAB — CBC
HCT: 38.1 % (ref 36.0–46.0)
Hemoglobin: 11.9 g/dL — ABNORMAL LOW (ref 12.0–15.0)
MCH: 24.6 pg — AB (ref 26.0–34.0)
MCHC: 31.2 g/dL (ref 30.0–36.0)
MCV: 78.7 fL (ref 78.0–100.0)
Platelets: 259 10*3/uL (ref 150–400)
RBC: 4.84 MIL/uL (ref 3.87–5.11)
RDW: 15.6 % — ABNORMAL HIGH (ref 11.5–15.5)
WBC: 13.4 10*3/uL — ABNORMAL HIGH (ref 4.0–10.5)

## 2017-09-15 LAB — GLUCOSE, CAPILLARY: GLUCOSE-CAPILLARY: 277 mg/dL — AB (ref 70–99)

## 2017-09-15 LAB — RAPID URINE DRUG SCREEN, HOSP PERFORMED
AMPHETAMINES: NOT DETECTED
BARBITURATES: NOT DETECTED
BENZODIAZEPINES: NOT DETECTED
COCAINE: NOT DETECTED
Opiates: POSITIVE — AB
Tetrahydrocannabinol: NOT DETECTED

## 2017-09-15 LAB — I-STAT TROPONIN, ED: TROPONIN I, POC: 0 ng/mL (ref 0.00–0.08)

## 2017-09-15 LAB — COMPREHENSIVE METABOLIC PANEL
ALBUMIN: 3.2 g/dL — AB (ref 3.5–5.0)
ALT: 71 U/L — ABNORMAL HIGH (ref 0–44)
ANION GAP: 10 (ref 5–15)
AST: 66 U/L — AB (ref 15–41)
Alkaline Phosphatase: 490 U/L — ABNORMAL HIGH (ref 38–126)
BILIRUBIN TOTAL: 0.7 mg/dL (ref 0.3–1.2)
BUN: 23 mg/dL (ref 8–23)
CHLORIDE: 101 mmol/L (ref 98–111)
CO2: 25 mmol/L (ref 22–32)
Calcium: 9.6 mg/dL (ref 8.9–10.3)
Creatinine, Ser: 1.68 mg/dL — ABNORMAL HIGH (ref 0.44–1.00)
GFR calc Af Amer: 35 mL/min — ABNORMAL LOW (ref >60)
GFR calc non Af Amer: 30 mL/min — ABNORMAL LOW (ref >60)
GLUCOSE: 183 mg/dL — AB (ref 70–99)
Potassium: 4 mmol/L (ref 3.5–5.1)
Sodium: 136 mmol/L (ref 135–145)
TOTAL PROTEIN: 8.8 g/dL — AB (ref 6.5–8.1)

## 2017-09-15 LAB — CBG MONITORING, ED: Glucose-Capillary: 173 mg/dL — ABNORMAL HIGH (ref 70–99)

## 2017-09-15 LAB — URINALYSIS, ROUTINE W REFLEX MICROSCOPIC
Bilirubin Urine: NEGATIVE
GLUCOSE, UA: NEGATIVE mg/dL
Hgb urine dipstick: NEGATIVE
KETONES UR: NEGATIVE mg/dL
LEUKOCYTES UA: NEGATIVE
NITRITE: NEGATIVE
PH: 5 (ref 5.0–8.0)
Protein, ur: NEGATIVE mg/dL
Specific Gravity, Urine: 1.009 (ref 1.005–1.030)

## 2017-09-15 LAB — LACTIC ACID, PLASMA
Lactic Acid, Venous: 1.6 mmol/L (ref 0.5–1.9)
Lactic Acid, Venous: 1.4 mmol/L (ref 0.5–1.9)

## 2017-09-15 LAB — ETHANOL: Alcohol, Ethyl (B): <10 mg/dL (ref <10)

## 2017-09-15 LAB — AMMONIA: AMMONIA: 39 umol/L — AB (ref 9–35)

## 2017-09-15 MED ORDER — METHOCARBAMOL 500 MG PO TABS
500.0000 mg | ORAL_TABLET | Freq: Three times a day (TID) | ORAL | Status: DC | PRN
Start: 2017-09-15 — End: 2017-09-16
  Administered 2017-09-15: 500 mg via ORAL
  Filled 2017-09-15: qty 1

## 2017-09-15 MED ORDER — INSULIN GLARGINE 100 UNIT/ML ~~LOC~~ SOLN
75.0000 [IU] | Freq: Every day | SUBCUTANEOUS | Status: DC
  Administered 2017-09-16: 75 [IU] via SUBCUTANEOUS
  Filled 2017-09-15 (×2): qty 0.75

## 2017-09-15 MED ORDER — SODIUM CHLORIDE 0.9 % IV BOLUS
1000.0000 mL | Freq: Once | INTRAVENOUS | Status: AC
  Administered 2017-09-15: 1000 mL via INTRAVENOUS

## 2017-09-15 MED ORDER — BUDESONIDE 0.5 MG/2ML IN SUSP
0.5000 mg | Freq: Two times a day (BID) | RESPIRATORY_TRACT | Status: DC
  Administered 2017-09-15 – 2017-09-16 (×3): 0.5 mg via RESPIRATORY_TRACT
  Filled 2017-09-15 (×8): qty 2

## 2017-09-15 MED ORDER — ACETAMINOPHEN 325 MG PO TABS
650.0000 mg | ORAL_TABLET | ORAL | Status: DC | PRN
  Administered 2017-09-15: 650 mg via ORAL
  Filled 2017-09-15: qty 2

## 2017-09-15 MED ORDER — FLUTICASONE FUROATE 100 MCG/ACT IN AEPB: 1.0000 | INHALATION_SPRAY | Freq: Every day | RESPIRATORY_TRACT | Status: DC

## 2017-09-15 MED ORDER — LORATADINE 10 MG PO TABS
10.0000 mg | ORAL_TABLET | Freq: Every day | ORAL | Status: DC
Start: 2017-09-15 — End: 2017-09-16
  Administered 2017-09-15 – 2017-09-16 (×2): 10 mg via ORAL
  Filled 2017-09-15 (×2): qty 1

## 2017-09-15 MED ORDER — PRAVASTATIN SODIUM 40 MG PO TABS
80.0000 mg | ORAL_TABLET | Freq: Every day | ORAL | Status: DC
  Administered 2017-09-15: 80 mg via ORAL
  Filled 2017-09-15: qty 2

## 2017-09-15 MED ORDER — VITAMIN E 45 MG (100 UNIT) PO CAPS
200.0000 [IU] | ORAL_CAPSULE | Freq: Every day | ORAL | Status: DC
  Administered 2017-09-15 – 2017-09-16 (×2): 200 [IU] via ORAL
  Filled 2017-09-15 (×3): qty 2

## 2017-09-15 MED ORDER — VITAMIN D 1000 UNITS PO TABS
4000.0000 [IU] | ORAL_TABLET | Freq: Every day | ORAL | Status: DC
  Administered 2017-09-15 – 2017-09-16 (×2): 4000 [IU] via ORAL
  Filled 2017-09-15 (×2): qty 4

## 2017-09-15 MED ORDER — LUBIPROSTONE 8 MCG PO CAPS
8.0000 ug | ORAL_CAPSULE | Freq: Two times a day (BID) | ORAL | Status: DC
  Administered 2017-09-15: 8 ug via ORAL
  Filled 2017-09-15 (×5): qty 1

## 2017-09-15 MED ORDER — ACETAMINOPHEN 650 MG RE SUPP: 650.0000 mg | RECTAL | Status: DC | PRN

## 2017-09-15 MED ORDER — PANTOPRAZOLE SODIUM 40 MG PO TBEC
40.0000 mg | DELAYED_RELEASE_TABLET | Freq: Every day | ORAL | Status: DC
Start: 2017-09-15 — End: 2017-09-16
  Administered 2017-09-15 – 2017-09-16 (×2): 40 mg via ORAL
  Filled 2017-09-15 (×2): qty 1

## 2017-09-15 MED ORDER — ONDANSETRON HCL 4 MG/2ML IJ SOLN
INTRAMUSCULAR | Status: AC
Start: 2017-09-15 — End: 2017-09-15
  Administered 2017-09-15: 4 mg via INTRAVENOUS
  Filled 2017-09-15: qty 2

## 2017-09-15 MED ORDER — STROKE: EARLY STAGES OF RECOVERY BOOK
Freq: Once | Status: AC
  Administered 2017-09-15: 12:00:00
  Filled 2017-09-15 (×2): qty 1

## 2017-09-15 MED ORDER — LEVOTHYROXINE SODIUM 100 MCG PO TABS
100.0000 ug | ORAL_TABLET | Freq: Every day | ORAL | Status: DC
  Administered 2017-09-15 – 2017-09-16 (×2): 100 ug via ORAL
  Filled 2017-09-15: qty 1

## 2017-09-15 MED ORDER — ACETAMINOPHEN 500 MG PO TABS: 1000.0000 mg | ORAL_TABLET | Freq: Three times a day (TID) | ORAL | Status: DC | PRN

## 2017-09-15 MED ORDER — METOPROLOL TARTRATE 25 MG PO TABS
25.0000 mg | ORAL_TABLET | Freq: Two times a day (BID) | ORAL | Status: DC
  Administered 2017-09-15 – 2017-09-16 (×3): 25 mg via ORAL
  Filled 2017-09-15 (×3): qty 1

## 2017-09-15 MED ORDER — INSULIN GLARGINE 100 UNIT/ML ~~LOC~~ SOLN
80.0000 [IU] | Freq: Every day | SUBCUTANEOUS | Status: DC
  Administered 2017-09-15: 80 [IU] via SUBCUTANEOUS
  Filled 2017-09-15 (×2): qty 0.8

## 2017-09-15 MED ORDER — VITAMIN C 500 MG PO TABS
1000.0000 mg | ORAL_TABLET | Freq: Every day | ORAL | Status: DC
  Administered 2017-09-15 – 2017-09-16 (×2): 1000 mg via ORAL
  Filled 2017-09-15 (×5): qty 2

## 2017-09-15 MED ORDER — MELATONIN 3 MG PO TABS
9.0000 mg | ORAL_TABLET | Freq: Every day | ORAL | Status: DC
  Administered 2017-09-15: 9 mg via ORAL
  Filled 2017-09-15 (×2): qty 3

## 2017-09-15 MED ORDER — BENZONATATE 100 MG PO CAPS: 100.0000 mg | ORAL_CAPSULE | Freq: Every day | ORAL | Status: DC | PRN

## 2017-09-15 MED ORDER — LIDOCAINE 5 % EX OINT
TOPICAL_OINTMENT | Freq: Every day | CUTANEOUS | Status: DC | PRN
Start: 2017-09-15 — End: 2017-09-16
  Filled 2017-09-15: qty 35.44

## 2017-09-15 MED ORDER — ALBUTEROL SULFATE (2.5 MG/3ML) 0.083% IN NEBU: 2.5000 mg | INHALATION_SOLUTION | Freq: Four times a day (QID) | RESPIRATORY_TRACT | Status: DC | PRN

## 2017-09-15 MED ORDER — PREGABALIN 75 MG PO CAPS
75.0000 mg | ORAL_CAPSULE | Freq: Two times a day (BID) | ORAL | Status: DC
  Administered 2017-09-15 – 2017-09-16 (×3): 75 mg via ORAL
  Filled 2017-09-15 (×3): qty 1

## 2017-09-15 MED ORDER — ACETAMINOPHEN 160 MG/5ML PO SOLN: 650.0000 mg | ORAL | Status: DC | PRN

## 2017-09-15 MED ORDER — APIXABAN 5 MG PO TABS
5.0000 mg | ORAL_TABLET | Freq: Two times a day (BID) | ORAL | Status: DC
  Administered 2017-09-15 – 2017-09-16 (×3): 5 mg via ORAL
  Filled 2017-09-15 (×3): qty 1

## 2017-09-15 MED ORDER — ONDANSETRON HCL 4 MG/2ML IJ SOLN
4.0000 mg | Freq: Once | INTRAMUSCULAR | Status: AC
  Administered 2017-09-15: 4 mg via INTRAVENOUS

## 2017-09-15 MED ORDER — VITAMIN B-12 1000 MCG PO TABS
1000.0000 ug | ORAL_TABLET | Freq: Every day | ORAL | Status: DC
  Administered 2017-09-15 – 2017-09-16 (×2): 1000 ug via ORAL
  Filled 2017-09-15 (×2): qty 1

## 2017-09-15 MED ORDER — HYDROXYZINE HCL 25 MG PO TABS: 25.0000 mg | ORAL_TABLET | Freq: Three times a day (TID) | ORAL | Status: DC | PRN

## 2017-09-15 MED ORDER — DULOXETINE HCL 30 MG PO CPEP
30.0000 mg | ORAL_CAPSULE | Freq: Two times a day (BID) | ORAL | Status: DC
  Administered 2017-09-15 – 2017-09-16 (×3): 30 mg via ORAL
  Filled 2017-09-15 (×3): qty 1

## 2017-09-15 MED ORDER — HYDROCODONE-ACETAMINOPHEN 5-325 MG PO TABS
1.0000 | ORAL_TABLET | Freq: Four times a day (QID) | ORAL | Status: DC | PRN
  Administered 2017-09-15: 1 via ORAL
  Administered 2017-09-16: 2 via ORAL
  Administered 2017-09-16: 1 via ORAL
  Filled 2017-09-15: qty 2
  Filled 2017-09-15 (×2): qty 1

## 2017-09-15 NOTE — Plan of Care (Signed)
  Problem: Acute Rehab PT Goals(only PT should resolve) Goal: Patient Will Transfer Sit To/From Stand Outcome: Progressing Flowsheets (Taken 09/15/2017 1531) Patient will transfer sit to/from stand: Independently Goal: Pt Will Transfer Bed To Chair/Chair To Bed Outcome: Progressing Flowsheets (Taken 09/15/2017 1531) Pt will Transfer Bed to Chair/Chair to Bed: Independently Goal: Pt Will Ambulate Outcome: Progressing Flowsheets (Taken 09/15/2017 1531) Pt will Ambulate: with modified independence;75 feet;with cane;with rolling walker Note:  Without use of an AD   3:32 PM, 09/15/17 Lonell Grandchild, MPT Physical Therapist with [facility] 336 [phone] office [phone] mobile phone

## 2017-09-15 NOTE — Progress Notes (Signed)
SLP Cancellation Note  Patient Details Name: Stephanie [name] MRN: [id number] DOB: 1950/10/20   Cancelled treatment:       Reason Eval/Treat Not Completed: SLP screened, no needs identified, will sign off; SLP screened Pt in room. Pt denies any changes in swallowing, speech, language, or cognition. MRI negative for acute changes. SLE will be deferred at this time. Reconsult if indicated. SLP will sign off.  Thank you,  Genene Churn, [place]    [place] 09/15/2017, 2:09 PM

## 2017-09-15 NOTE — ED Notes (Signed)
EDP ok to give ice

## 2017-09-15 NOTE — ED Provider Notes (Signed)
[facility] EMERGENCY DEPARTMENT Provider Note   CSN: [id number] Arrival date & time: 09/15/17  0359  Time seen 04:45 AM   History   Chief Complaint Chief Complaint  Patient presents with  . Altered Mental Status    HPI Stephanie [name] is a 67 y.o. female.  HPI she states she had gone outside and when she came in the house her legs got weak and she fell into a chair in the living room.  She states she was able to get up afterwards and walked into her bedroom.  She states she felt hot and sweaty and she sat on the side of the bed and then she could not get up.  She states her brain was telling her legs to move but they would not move.  She states she had something similar happened in February without a diagnosis found.  She states she has a sinus headache and she points to the area underneath her eyes and near the upper maxillary sinus bilaterally.  She states she has had that since about 6 PM.  She has had nausea without vomiting.  When I entered the room she was coughing she states that started just prior to me walking into the room and did not bother her all day.  She denies any history of stroke.  She denies taking any extra medications.  Patient states she is right-handed.  PCP Jani Gravel, MD   Past Medical History:  Diagnosis Date  . Anemia   . Arthritis   . Asthma   . COPD (chronic obstructive pulmonary disease) ([place])   . Deep vein thrombosis (DVT) of both lower extremities ([place]) 06/27/2015  . Diabetes mellitus   . Fibromyalgia   . GERD (gastroesophageal reflux disease)   . H/O hiatal hernia   . Hypercholesteremia   . Hypertension   . Hyperthyroidism   . IBS (irritable bowel syndrome)   . Inner ear disease   . 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 PONV (postoperative nausea and vomiting)   . Tachycardia     Patient Active Problem List   Diagnosis Date Noted  . Localized osteoarthritis of right shoulder 08/08/2017  . Dysphagia 12/16/2015    . 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 Constipation 10/27/2015  . Diarrhea 08/02/2015  . Elevated alkaline phosphatase level 07/23/2015  . Heme positive stool 07/23/2015  . Hypoglycemia 07/01/2015  . Deep vein thrombosis (DVT) of both lower extremities ([place]) 06/27/2015  . 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 Maculopapular rash, generalized 06/27/2015  . Alopecia 06/27/2015  . Hypothyroidism 06/27/2015  . Diverticulitis 10/19/2013  . Tobacco abuse 10/19/2013  . Obesity 10/19/2013  . Diabetes ([place]) 10/19/2013  . Hypertension   . Anemia   . GERD (gastroesophageal reflux disease)   . COPD (chronic obstructive pulmonary disease) ([place])     Past Surgical History:  Procedure Laterality Date  . ABDOMINAL HYSTERECTOMY  partial  . CARPAL TUNNEL RELEASE Right 1991  . CATARACT EXTRACTION W/PHACO Right 05/08/2013   Procedure: CATARACT EXTRACTION PHACO AND INTRAOCULAR LENS PLACEMENT (IOC);  Surgeon: Tonny Branch, MD;  Location: [facility];  Service: Ophthalmology;  Laterality: Right;  CDE 10.31  . CATARACT EXTRACTION W/PHACO Left 08/17/2013   Procedure: CATARACT EXTRACTION PHACO AND INTRAOCULAR LENS PLACEMENT (IOC);  Surgeon: Tonny Branch, MD;  Location: [facility];  Service: Ophthalmology;  Laterality: Left;  CDE:9.03  . CHOLECYSTECTOMY    . COLONOSCOPY WITH PROPOFOL N/A 01/06/2016   Dr. Gala Romney: diverticulosis   .  DENTAL SURGERY    . ESOPHAGOGASTRODUODENOSCOPY (EGD) WITH PROPOFOL N/A 01/06/2016   Dr. Gala Romney: normal s/p empiric dilation   . EYE SURGERY    . MALONEY DILATION N/A 01/06/2016   Procedure: Venia Minks DILATION;  Surgeon: Daneil Dolin, MD;  Location: [facility] SU[facility];  Service: Endoscopy;  Laterality: N/A;  . REVERSE SHOULDER ARTHROPLASTY Right 08/06/2017   Procedure: RIGHT REVERSE SHOULDER ARTHROPLASTY;  Surgeon: Netta Cedars, MD;  Location: [facility];  Service: Orthopedics;  Laterality: Right;  . WRIST GANGLION EXCISION Left      OB History   None      Home  Medications    Prior to Admission medications   Medication Sig Start Date End Date Taking? Authorizing Provider  acetaminophen (TYLENOL) 500 MG tablet Take 1,000 mg by mouth 3 (three) times daily.     [provider]  albuterol (PROVENTIL HFA;VENTOLIN HFA) 108 (90 BASE) MCG/ACT inhaler Inhale 2 puffs into the lungs every 6 (six) hours as needed for wheezing or shortness of breath.    [provider]  albuterol (PROVENTIL) (2.5 MG/3ML) 0.083% nebulizer solution Take 2.5 mg by nebulization every 6 (six) hours as needed for wheezing or shortness of breath.    [provider]  apixaban (ELIQUIS) 5 MG TABS tablet Take 10 mg by mouth BID through Friday 5/19 evening. Take 5mg  by mouth BID starting 5/20 AM. Patient taking differently: Take 5 mg by mouth 2 (two) times daily.  07/25/15   Lendon Colonel, NP  ARNUITY ELLIPTA 100 MCG/ACT AEPB Inhale 1 puff into the lungs daily.  06/13/17   [provider]  Ascorbic Acid (VITAMIN C) 1000 MG tablet Take 1,000 mg by mouth daily.     [provider]  benzonatate (TESSALON PERLES) 100 MG capsule Take 1-2 capsules by mouth daily as needed (cough).     [provider]  cetirizine (ZYRTEC) 10 MG tablet Take 10 mg by mouth daily.    [provider]  Cholecalciferol (VITAMIN D) 2000 units tablet Take 4,000 Units by mouth daily.     [provider]  CINNAMON PO Take 2-3 capsules by mouth 2 (two) times daily as needed (high blood sugar).     [provider]  clobetasol ointment (TEMOVATE) 9.70 % Apply 1 application topically 2 (two) times daily as needed (rash).  06/20/15   [provider]  Cod Liver Oil CAPS Take 1 capsule by mouth at bedtime.     [provider]  dexlansoprazole (DEXILANT) 60 MG capsule Take 1 capsule (60 mg total) by mouth daily. 03/02/17   Carlis Stable, NP  diphenhydrAMINE (BENADRYL) 25 MG tablet Take 25 mg by mouth at bedtime as needed.    [provider]  Doxepin HCl 5 % CREA apply 2 grams (2 grams=2 inches) to affected area(s) 3 times daily. Takes as needed 05/28/16   [provider]  Dulaglutide (TRULICITY) 1.67 YO/3.7CH SOPN Inject 1.5 mg into the skin once a week.    [provider]  DULoxetine (CYMBALTA) 30 MG capsule Take 30 mg by mouth 2 (two) times daily.  06/20/15   [provider]  fluticasone (VERAMYST) 27.5 MCG/SPRAY nasal spray Place 2 sprays into the nose daily.     [provider]  HUMALOG KWIKPEN 100 UNIT/ML KiwkPen Inject 30-35 Units into the skin 3 (three) times daily. Hold if blood sugar is 100, if blood sugar is 200 or higher take 35 11/15/15   [provider]  HYDROcodone-acetaminophen (NORCO) 5-325 MG tablet Take 1-2 tablets by mouth every 6 (six) hours as needed for moderate pain. 08/06/17   Netta Cedars, MD  hydrOXYzine (ATARAX/VISTARIL) 25 MG tablet Take 25 mg by mouth every 8 (eight) hours as needed for itching.  06/20/15   [provider]  Insulin Glargine (LANTUS SOLOSTAR) 100 UNIT/ML Solostar Pen Inject 75-80 Units into the skin 2 (two) times daily. 75 units in am, 80 units at night    [provider]  levothyroxine (SYNTHROID, LEVOTHROID) 100 MCG tablet Take 100 mcg by mouth daily before breakfast.  11/23/13   [provider]  lidocaine (XYLOCAINE) 5 % ointment APPLY 2-3 GRAMS TO AFFECTED AREA TWICE DAILY AS NEEDED 05/28/16   [provider]  lisinopril (PRINIVIL,ZESTRIL) 2.5 MG tablet Take 2.5 mg by mouth daily.    [provider]  lubiprostone (AMITIZA) 8 MCG capsule Take 1 capsule (8 mcg total) by mouth 2 (two) times daily with a meal. 05/19/17   Mahala Menghini, PA-C  LYRICA 75 MG capsule Take 75 mg by mouth 2 (two) times daily.  07/18/15   [provider]  Melatonin 10 MG TABS Take 10 mg by mouth at bedtime.     [provider]  methocarbamol (ROBAXIN) 500 MG tablet Take 1 tablet (500 mg total) by mouth 3  (three) times daily as needed. 08/06/17   Netta Cedars, MD  metolazone (ZAROXOLYN) 5 MG tablet Take 1 tablet (5 mg total) by mouth 2 (two) times daily. 07/02/15   Samuella Cota, MD  metoprolol tartrate (LOPRESSOR) 100 MG tablet Take 100 mg by mouth 2 (two) times daily.  05/18/17   [provider]  metoprolol tartrate (LOPRESSOR) 25 MG tablet Take 25 mg by mouth 2 (two) times daily.  05/18/17   [provider]  mometasone (ELOCON) 0.1 % cream Apply 1 application topically daily as needed (rash).  12/16/15   [provider]  NOVOFINE 32G X 6 MM MISC  06/13/17   [provider]  pravastatin (PRAVACHOL) 80 MG tablet Take 80 mg by mouth at bedtime.  06/20/15   [provider]  PROAIR RESPICLICK 793 (90 Base) MCG/ACT AEPB Inhale 2 puffs into the lungs every 6 (six) hours.  05/20/17   [provider]  tiZANidine (ZANAFLEX) 4 MG tablet Take 4 mg by mouth 4 (four) times daily.  05/17/17   [provider]  torsemide (DEMADEX) 20 MG tablet Take 2 tablets (40 mg total) by mouth daily. 07/02/15   Samuella Cota, MD  traMADol (ULTRAM) 50 MG tablet Take 1 tablet (50 mg total) by mouth every 6 (six) hours as needed. 05/27/15   [name], MD  vitamin B-12 (CYANOCOBALAMIN) 1000 MCG tablet Take 1,000 mcg by mouth daily.     [provider]  vitamin E 200 UNIT capsule Take 200 Units by mouth daily.     [provider]    Family History Family History  Problem Relation Age of Onset  . Colon cancer Neg Hx     Social History Social History   Tobacco Use  . Smoking status: Former Smoker    Packs/day: 0.25    Years: 30.00    Pack years: 7.50    Types: Cigarettes    Last attempt to quit: 02/17/2014    Years since quitting: 3.5  . Smokeless tobacco: Never Used  . Tobacco comment: some day smoker  Substance Use Topics  . Alcohol use: No    Alcohol/week:  0.0 oz  . Drug use: No  on disability for back pain Lives with daughter Drinks  alcohol occassionally  Allergies   Tetracyclines & related; Banana; and Penicillins   Review of Systems Review of Systems  All other systems reviewed and are negative.    Physical Exam Updated Vital Signs BP (!) 74/57   Pulse 86   Resp (!) 30   Ht 5\' 7"  (1.702 m)   Wt 106.6 kg (235 lb)   SpO2 96%   BMI 36.81 kg/m   Vital signs normal except hypotension   Physical Exam  Constitutional: She is oriented to person, place, and time. She appears well-developed and well-nourished.  Non-toxic appearance. She does not appear ill. No distress.  Obese, sleeping but easily awakened.  HENT:  Head: Normocephalic and atraumatic.  Right Ear: External ear normal.  Left Ear: External ear normal.  Nose: Nose normal. No mucosal edema or rhinorrhea.  Mouth/Throat: Oropharynx is clear and moist and mucous membranes are normal. No dental abscesses or uvula swelling.  Eyes: Pupils are equal, round, and reactive to light. Conjunctivae and EOM are normal.  Neck: Normal range of motion and full passive range of motion without pain. Neck supple.  Cardiovascular: Normal rate, regular rhythm and normal heart sounds. Exam reveals no gallop and no friction rub.  No murmur heard. Pulmonary/Chest: Effort normal and breath sounds normal. No respiratory distress. She has no wheezes. She has no rhonchi. She has no rales. She exhibits no tenderness and no crepitus.  Abdominal: Soft. Normal appearance and bowel sounds are normal. She exhibits no distension. There is no tenderness. There is no rebound and no guarding.  Musculoskeletal: Normal range of motion. She exhibits no edema or tenderness.  Moves all extremities well.   Neurological: She is alert and oriented to person, place, and time. She has normal strength. No cranial nerve deficit.  Patient's grips are equal, when I try to check pronator drift she is unable to abduct at her shoulder level due to chronic other problems.  However when she flexes at the  elbow and points to the ceiling she does not have pronator drift.  She is able to lift either leg up off the stretcher and hold it for a period of time.  Skin: Skin is warm, dry and intact. No rash noted. No erythema. No pallor.  Psychiatric: Her affect is blunt. Her speech is delayed and slurred. She is slowed.  Nursing note and vitals reviewed.    ED Treatments / Results  Labs (all labs ordered are listed, but only abnormal results are displayed) Results for orders placed or performed during the hospital encounter of 09/15/17  Comprehensive metabolic panel  Result Value Ref Range   Sodium 136 135 - 145 mmol/L   Potassium 4.0 3.5 - 5.1 mmol/L   Chloride 101 98 - 111 mmol/L   CO2 25 22 - 32 mmol/L   Glucose, Bld 183 (H) 70 - 99 mg/dL   BUN 23 8 - 23 mg/dL   Creatinine, Ser 1.68 (H) 0.44 - 1.00 mg/dL   Calcium 9.6 8.9 - 10.3 mg/dL   Total Protein 8.8 (H) 6.5 - 8.1 g/dL   Albumin 3.2 (L) 3.5 - 5.0 g/dL   AST 66 (H) 15 - 41 U/L   ALT 71 (H) 0 - 44 U/L   Alkaline Phosphatase 490 (H) 38 - 126 U/L   Total Bilirubin 0.7 0.3 - 1.2 mg/dL   GFR calc non Af Amer 30 (L) >60 mL/min  GFR calc Af Amer 35 (L) >60 mL/min   Anion gap 10 5 - 15  CBC  Result Value Ref Range   WBC 13.4 (H) 4.0 - 10.5 K/uL   RBC 4.84 3.87 - 5.11 MIL/uL   Hemoglobin 11.9 (L) 12.0 - 15.0 g/dL   HCT 38.1 36.0 - 46.0 %   MCV 78.7 78.0 - 100.0 fL   MCH 24.6 (L) 26.0 - 34.0 pg   MCHC 31.2 30.0 - 36.0 g/dL   RDW 15.6 (H) 11.5 - 15.5 %   Platelets 259 150 - 400 K/uL  Ethanol  Result Value Ref Range   Alcohol, Ethyl (B) <10 <10 mg/dL  Urinalysis, Routine w reflex microscopic  Result Value Ref Range   Color, Urine YELLOW YELLOW   APPearance CLEAR CLEAR   Specific Gravity, Urine 1.009 1.005 - 1.030   pH 5.0 5.0 - 8.0   Glucose, UA NEGATIVE NEGATIVE mg/dL   Hgb urine dipstick NEGATIVE NEGATIVE   Bilirubin Urine NEGATIVE NEGATIVE   Ketones, ur NEGATIVE NEGATIVE mg/dL   Protein, ur NEGATIVE NEGATIVE mg/dL    Nitrite NEGATIVE NEGATIVE   Leukocytes, UA NEGATIVE NEGATIVE  Urine rapid drug screen (hosp performed)  Result Value Ref Range   Opiates POSITIVE (A) NONE DETECTED   Cocaine NONE DETECTED NONE DETECTED   Benzodiazepines NONE DETECTED NONE DETECTED   Amphetamines NONE DETECTED NONE DETECTED   Tetrahydrocannabinol NONE DETECTED NONE DETECTED   Barbiturates NONE DETECTED NONE DETECTED  Ammonia  Result Value Ref Range   Ammonia 39 (H) 9 - 35 umol/L  Lactic acid, plasma  Result Value Ref Range   Lactic Acid, Venous 1.6 0.5 - 1.9 mmol/L  CBG monitoring, ED  Result Value Ref Range   Glucose-Capillary 173 (H) 70 - 99 mg/dL   Comment 1 Notify RN    Comment 2 Document in Chart    Laboratory interpretation all normal except hyperglycemia, very minimal anemia, leukocytosis, renal insufficiency, elevation of the LFTs, mild elevation of LFT's    EKG EKG Interpretation  Date/Time:  Wednesday September 15 2017 04:33:20 EDT Ventricular Rate:  86 PR Interval:    QRS Duration: 89 QT Interval:  442 QTC Calculation: 529 R Axis:   -11 Text Interpretation:  Sinus rhythm Inferior infarct, old Prolonged QT interval No significant change since last tracing 21 May 2017 Confirmed by [name] [phone]) on 09/15/2017 4:57:19 AM   Radiology Ct Head Wo Contrast  Result Date: 09/15/2017 CLINICAL DATA:  Initial evaluation for acute altered mental status. EXAM: CT HEAD WITHOUT CONTRAST TECHNIQUE: Contiguous axial images were obtained from the base of the skull through the vertex without intravenous contrast. COMPARISON:  Prior MRI from 12/25/2015. FINDINGS: Brain: Cerebral volume within normal limits for patient age. No evidence for acute intracranial hemorrhage. No findings to suggest acute large vessel territory infarct. No mass lesion, midline shift, or mass effect. Ventricles are normal in size without evidence for hydrocephalus. No extra-axial fluid collection identified. Vascular: No hyperdense vessel  identified. Skull: Scalp soft tissues demonstrate no acute abnormality. Calvarium intact. Sinuses/Orbits: Globes and orbital soft tissues within normal limits. Visualized paranasal sinuses are clear. No mastoid effusion. IMPRESSION: Negative head CT.  No acute intracranial abnormality identified. Electronically Signed   By: Jeannine Boga M.D.   On: 09/15/2017 05:48    Procedures .Critical Care Performed by: [name], MD Authorized by: [name], MD   Critical care provider statement:    Critical care time (minutes):  38   Critical care was  necessary to treat or prevent imminent or life-threatening deterioration of the following conditions:  Circulatory failure   Critical care was time spent personally by me on the following activities:  Discussions with consultants, examination of patient, obtaining history from patient or surrogate, ordering and review of laboratory studies, ordering and review of radiographic studies, pulse oximetry, re-evaluation of patient's condition and review of old charts   (including critical care time)  Medications Ordered in ED Medications  ondansetron (ZOFRAN) injection 4 mg (4 mg Intravenous Given 09/15/17 0530)  sodium chloride 0.9 % bolus 1,000 mL (1,000 mLs Intravenous New Bag/Given 09/15/17 0629)  sodium chloride 0.9 % bolus 1,000 mL (0 mLs Intravenous Stopped 09/15/17 4742)     Initial Impression / Assessment and Plan / ED Course  I have reviewed the triage vital signs and the nursing notes.  Pertinent labs & imaging results that were available during my care of the patient were reviewed by me and considered in my medical decision making (see chart for details).   The patient's etiology of her altered mental status is unclear, she complains of generalized weakness without focal findings on exam.  Head CT was done.  She denies taking any extra medications.  After reviewing her initial blood work a ammonia level was added.  Urine and urine drug  screen was done also.  Also alcohol level.  Patient was noted to be hypotensive, she was given 2 L bolus of fluid and lactic acid was added to her blood work.  5:45 AM daughter is here, she states patient seemed fine all day.  Patient called her because she, the daughter was sleeping and she noted the patient had slurred speech.  She states they took her blood pressure prior to leaving her home and it was 150.  6:45 AM patient is starting her second liter of IV fluids.  Her blood pressure now is 101/66 heart rate 80.  Patient states she is feeling better.  She has not had her rectal temp yet however she did have a oral temp of 97 point something verbally given to me by nursing staff.  07:47 AM Dr Daleen Bo, hospitalist wants a CXR and he will see patient.   Review of the [place] shows patient gets #90 tramadol 50 mg tablets monthly, last filled July 17, #60 Lyrica 75 mg monthly last filled July 17, and she also has a prescription for #60 hydrocodone 5/325 that was filled on July 3 and #30 hydrocodone 5/325 filled on June 25.  Those are the only 2 hydrocodone prescriptions in 2 years.  Final Clinical Impressions(s) / ED Diagnoses   Final diagnoses:  Hypotension, unspecified hypotension type  Somnolence  Weakness generalized  Hypothermia, initial encounter    Plan admission  [name], MD, Barbette Or, MD 09/15/17 [phone]

## 2017-09-15 NOTE — H&P (Signed)
[facility] History and Physical  Stephanie [name] ZOX:[id number] DOB: 07-Sep-1950 DOA: 09/15/2017  Referring physician:  PCP: Jani Gravel, MD  Specialists:   Chief Complaint: leg weakness   HPI: Stephanie [name] is a 67 y.o. female with PMH of HTN, DVT, CKD, A fib (on apixaban), COPD, DM, Chronic pain, presented with bilateral leg weakness. Patient states that she woke up around 2.00 am felt weak in her bilateral legs. She states that her right leg was more weaker than left leg. She reports mild slurred speech. She called her daughter and called EMS.  she was not able to get up frm her bed. She had an episode of sweating. No acute chest pain, no shortness of breath. Reportedly, glucose was around 218. She felt nauseated, no vomiting, no acute abdominal symptoms. She has mild cough, non productive.  She reports history of similar symptoms in the past. Denies any strokes or TIA in the past. Symptoms have gradually resolved in ED. Ct head: no acute infarcts. BP was 74/57 on admission. Improved after 2L NS. hospitalist is called for admission   Review of Systems: The patient denies anorexia, fever, weight loss,, vision loss, decreased hearing, hoarseness, chest pain, syncope, dyspnea on exertion, peripheral edema, balance deficits, hemoptysis, abdominal pain, melena, hematochezia, severe indigestion/heartburn, hematuria, incontinence, genital sores, muscle weakness, suspicious skin lesions, transient blindness, difficulty walking, depression, unusual weight change, abnormal bleeding, enlarged lymph nodes, angioedema, and breast masses.    Past Medical History:  Diagnosis Date  . Anemia   . Arthritis   . Asthma   . COPD (chronic obstructive pulmonary disease) ([place])   . Deep vein thrombosis (DVT) of both lower extremities ([place]) 06/27/2015  . Diabetes mellitus   . Fibromyalgia   . GERD (gastroesophageal reflux disease)   . H/O hiatal hernia   . Hypercholesteremia   . Hypertension   . Hyperthyroidism    . IBS (irritable bowel syndrome)   . Inner ear disease   . 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 PONV (postoperative nausea and vomiting)   . Tachycardia    Past Surgical History:  Procedure Laterality Date  . ABDOMINAL HYSTERECTOMY  partial  . CARPAL TUNNEL RELEASE Right 1991  . CATARACT EXTRACTION W/PHACO Right 05/08/2013   Procedure: CATARACT EXTRACTION PHACO AND INTRAOCULAR LENS PLACEMENT (IOC);  Surgeon: Tonny Branch, MD;  Location: [facility];  Service: Ophthalmology;  Laterality: Right;  CDE 10.31  . CATARACT EXTRACTION W/PHACO Left 08/17/2013   Procedure: CATARACT EXTRACTION PHACO AND INTRAOCULAR LENS PLACEMENT (IOC);  Surgeon: Tonny Branch, MD;  Location: [facility];  Service: Ophthalmology;  Laterality: Left;  CDE:9.03  . CHOLECYSTECTOMY    . COLONOSCOPY WITH PROPOFOL N/A 01/06/2016   Dr. Gala Romney: diverticulosis   . DENTAL SURGERY    . ESOPHAGOGASTRODUODENOSCOPY (EGD) WITH PROPOFOL N/A 01/06/2016   Dr. Gala Romney: normal s/p empiric dilation   . EYE SURGERY    . MALONEY DILATION N/A 01/06/2016   Procedure: Venia Minks DILATION;  Surgeon: Daneil Dolin, MD;  Location: [facility];  Service: Endoscopy;  Laterality: N/A;  . REVERSE SHOULDER ARTHROPLASTY Right 08/06/2017   Procedure: RIGHT REVERSE SHOULDER ARTHROPLASTY;  Surgeon: Netta Cedars, MD;  Location: [facility];  Service: Orthopedics;  Laterality: Right;  . WRIST GANGLION EXCISION Left    Social History:  reports that she quit smoking about 3 years ago. Her smoking use included cigarettes. She has a 7.50 pack-year smoking history. She has never used smokeless tobacco. She reports that she does not  drink alcohol or use drugs.  where does patient live--home, ALF, SNF? and with whom if at home? Not well;  Can patient participate in ADLs?  Allergies  Allergen Reactions  . Tetracyclines & Related Anaphylaxis  . Banana Hives and Nausea And Vomiting  . Penicillins Rash and Other (See Comments)    Has patient had a PCN  reaction causing immediate rash, facial/tongue/throat swelling, SOB or lightheadedness with hypotension: No Has patient had a PCN reaction causing severe rash involving mucus membranes or skin necrosis: No Has patient had a PCN reaction that required hospitalization No Has patient had a PCN reaction occurring within the last 10 years: No If all of the above answers are "NO", then may proceed with Cephalosporin use.     Family History  Problem Relation Age of Onset  . Colon cancer Neg Hx     (be sure to complete)  Prior to Admission medications   Medication Sig Start Date End Date Taking? Authorizing Provider  acetaminophen (TYLENOL) 500 MG tablet Take 1,000 mg by mouth 3 (three) times daily.     [provider]  albuterol (PROVENTIL HFA;VENTOLIN HFA) 108 (90 BASE) MCG/ACT inhaler Inhale 2 puffs into the lungs every 6 (six) hours as needed for wheezing or shortness of breath.    [provider]  albuterol (PROVENTIL) (2.5 MG/3ML) 0.083% nebulizer solution Take 2.5 mg by nebulization every 6 (six) hours as needed for wheezing or shortness of breath.    [provider]  apixaban (ELIQUIS) 5 MG TABS tablet Take 10 mg by mouth BID through Friday 5/19 evening. Take 5mg  by mouth BID starting 5/20 AM. Patient taking differently: Take 5 mg by mouth 2 (two) times daily.  07/25/15   Lendon Colonel, NP  ARNUITY ELLIPTA 100 MCG/ACT AEPB Inhale 1 puff into the lungs daily.  06/13/17   [provider]  Ascorbic Acid (VITAMIN C) 1000 MG tablet Take 1,000 mg by mouth daily.     [provider]  benzonatate (TESSALON PERLES) 100 MG capsule Take 1-2 capsules by mouth daily as needed (cough).     [provider]  cetirizine (ZYRTEC) 10 MG tablet Take 10 mg by mouth daily.    [provider]  Cholecalciferol (VITAMIN D) 2000 units tablet Take 4,000 Units by mouth daily.     [provider]  CINNAMON PO Take 2-3 capsules by mouth 2 (two)  times daily as needed (high blood sugar).     [provider]  clobetasol ointment (TEMOVATE) 0.93 % Apply 1 application topically 2 (two) times daily as needed (rash).  06/20/15   [provider]  Cod Liver Oil CAPS Take 1 capsule by mouth at bedtime.     [provider]  dexlansoprazole (DEXILANT) 60 MG capsule Take 1 capsule (60 mg total) by mouth daily. 03/02/17   Carlis Stable, NP  diphenhydrAMINE (BENADRYL) 25 MG tablet Take 25 mg by mouth at bedtime as needed.    [provider]  Doxepin HCl 5 % CREA apply 2 grams (2 grams=2 inches) to affected area(s) 3 times daily. Takes as needed 05/28/16   [provider]  Dulaglutide (TRULICITY) 1.5 OI/7.1IW SOPN Inject 1.5 mg into the skin once a week.    [provider]  DULoxetine (CYMBALTA) 30 MG capsule Take 30 mg by mouth 2 (two) times daily.  06/20/15   [provider]  fluticasone (VERAMYST) 27.5 MCG/SPRAY nasal spray Place 2 sprays into the nose daily.  [provider]  HUMALOG KWIKPEN 100 UNIT/ML KiwkPen Inject 30-35 Units into the skin 3 (three) times daily. Hold if blood sugar is 100, if blood sugar is 200 or higher take 35 11/15/15   [provider]  HYDROcodone-acetaminophen (NORCO) 5-325 MG tablet Take 1-2 tablets by mouth every 6 (six) hours as needed for moderate pain. 08/06/17   Netta Cedars, MD  hydrOXYzine (ATARAX/VISTARIL) 25 MG tablet Take 25 mg by mouth every 8 (eight) hours as needed for itching.  06/20/15   [provider]  Insulin Glargine (LANTUS SOLOSTAR) 100 UNIT/ML Solostar Pen Inject 75-80 Units into the skin 2 (two) times daily. 75 units in am, 80 units at night    [provider]  levothyroxine (SYNTHROID, LEVOTHROID) 100 MCG tablet Take 100 mcg by mouth daily before breakfast.  11/23/13   [provider]  lidocaine (XYLOCAINE) 5 % ointment APPLY 2-3 GRAMS TO AFFECTED AREA TWICE DAILY AS NEEDED 05/28/16   [provider]  lisinopril (PRINIVIL,ZESTRIL) 2.5 MG tablet Take 2.5 mg by mouth daily.    [provider]  lubiprostone (AMITIZA) 8 MCG capsule Take 1 capsule (8 mcg total) by mouth 2 (two) times daily with a meal. 05/19/17   Mahala Menghini, PA-C  LYRICA 75 MG capsule Take 75 mg by mouth 2 (two) times daily.  07/18/15   [provider]  Melatonin 10 MG TABS Take 10 mg by mouth at bedtime.     [provider]  methocarbamol (ROBAXIN) 500 MG tablet Take 1 tablet (500 mg total) by mouth 3 (three) times daily as needed. 08/06/17   Netta Cedars, MD  metolazone (ZAROXOLYN) 5 MG tablet Take 1 tablet (5 mg total) by mouth 2 (two) times daily. 07/02/15   Samuella Cota, MD  metoprolol tartrate (LOPRESSOR) 100 MG tablet Take 100 mg by mouth 2 (two) times daily.  05/18/17   [provider]  metoprolol tartrate (LOPRESSOR) 25 MG tablet Take 25 mg by mouth 2 (two) times daily.  05/18/17   [provider]  mometasone (ELOCON) 0.1 % cream Apply 1 application topically daily as needed (rash).  12/16/15   [provider]  NOVOFINE 32G X 6 MM MISC  06/13/17   [provider]  pravastatin (PRAVACHOL) 80 MG tablet Take 80 mg by mouth at bedtime.  06/20/15   [provider]  PROAIR RESPICLICK 867 (90 Base) MCG/ACT AEPB Inhale 2 puffs into the lungs every 6 (six) hours.  05/20/17   [provider]  tiZANidine (ZANAFLEX) 4 MG tablet Take 4 mg by mouth 4 (four) times daily.  05/17/17   [provider]  torsemide (DEMADEX) 20 MG tablet Take 2 tablets (40 mg total) by mouth daily. 07/02/15   Samuella Cota, MD  traMADol (ULTRAM) 50 MG tablet Take 1 tablet (50 mg total) by mouth every 6 (six) hours as needed. 05/27/15   [name], MD  vitamin B-12 (CYANOCOBALAMIN) 1000 MCG tablet Take 1,000 mcg by mouth daily.     [provider]  vitamin E 200 UNIT capsule Take 200 Units by mouth daily.     [provider]   Physical Exam: Vitals:    09/15/17 0800 09/15/17 0830  BP: 110/67 109/76  Pulse: 80 78  Resp: (!) 24 (!) 21  Temp:    SpO2: 94% 95%     General:  No distress   Eyes: eom-I, perrla   ENT: no oral ulcers   Neck: supple  Cardiovascular: s1,s2 rrr, midl pedal edema  Respiratory: CTA BL  Abdomen: soft, nt   Skin: no rash   Musculoskeletal: no joint tenderness   Psychiatric: no halluciantions   Neurologic: CN 2-12 intact, motor 5/5. Sensation is intact   Labs on Admission:  Basic Metabolic Panel: Recent Labs  Lab 09/15/17 0407  NA 136  K 4.0  CL 101  CO2 25  GLUCOSE 183*  BUN 23  CREATININE 1.68*  CALCIUM 9.6   Liver Function Tests: Recent Labs  Lab 09/15/17 0407  AST 66*  ALT 71*  ALKPHOS 490*  BILITOT 0.7  PROT 8.8*  ALBUMIN 3.2*   No results for input(s): LIPASE, AMYLASE in the last 168 hours. Recent Labs  Lab 09/15/17 0607  AMMONIA 39*   CBC: Recent Labs  Lab 09/15/17 0407  WBC 13.4*  HGB 11.9*  HCT 38.1  MCV 78.7  PLT 259   Cardiac Enzymes: No results for input(s): CKTOTAL, CKMB, CKMBINDEX, TROPONINI in the last 168 hours.  BNP (last 3 results) No results for input(s): BNP in the last 8760 hours.  ProBNP (last 3 results) No results for input(s): PROBNP in the last 8760 hours.  CBG: Recent Labs  Lab 09/15/17 0413  GLUCAP 173*    Radiological Exams on Admission: Ct Head Wo Contrast  Result Date: 09/15/2017 CLINICAL DATA:  Initial evaluation for acute altered mental status. EXAM: CT HEAD WITHOUT CONTRAST TECHNIQUE: Contiguous axial images were obtained from the base of the skull through the vertex without intravenous contrast. COMPARISON:  Prior MRI from 12/25/2015. FINDINGS: Brain: Cerebral volume within normal limits for patient age. No evidence for acute intracranial hemorrhage. No findings to suggest acute large vessel territory infarct. No mass lesion, midline shift, or mass effect. Ventricles are normal in size without evidence for hydrocephalus. No  extra-axial fluid collection identified. Vascular: No hyperdense vessel identified. Skull: Scalp soft tissues demonstrate no acute abnormality. Calvarium intact. Sinuses/Orbits: Globes and orbital soft tissues within normal limits. Visualized paranasal sinuses are clear. No mastoid effusion. IMPRESSION: Negative head CT.  No acute intracranial abnormality identified. Electronically Signed   By: Jeannine Boga M.D.   On: 09/15/2017 05:48   Dg Chest Port 1 View  Result Date: 09/15/2017 CLINICAL DATA:  Altered mental status EXAM: PORTABLE CHEST 1 VIEW COMPARISON:  02/10/2014 FINDINGS: Artifact overlies the chest. Heart size is normal. Mediastinal shadows are normal. Lungs are clear. The vascularity is normal. No effusions. No acute bone finding. Previous right shoulder replacement. IMPRESSION: No active disease. Electronically Signed   By: Nelson Chimes M.D.   On: 09/15/2017 08:28    EKG: Independently reviewed.   Assessment/Plan Active Problems:   TIA (transient ischemic attack)   67 y.o. female with PMH of HTN, DVT, A fib (on apixaban), COPD, DM, Chronic pain, presented with bilateral leg weakness, spurred speech   Possible TIA. Bilateral leg weakness, slurred speech prior to admission. Symptoms have resolved. CT head: no acute infarcts. We will obtain tia work up with mrai brain, ,echo, carotid ultrasound, ha1,c lipids. Obtain pt/to eval. Patient with history of a fib, already on apixaban    Hypotension in ED. Unclear etiology. Possible related to medication>tizanidine. No s/s of systemic infection. CXR: no clear infiltrates. NO:[id number]. No acute gi symptoms. -BP improved after 2L NS in ED. Cont monitor, hold BP meds, diuretics today. D/w patient, recommended to d/c tizanidine   DM. Reports taking humalog 30-35U TID. lantus or trisiba at 90U at night. HA1c-7.7. Will start cont lantus at 70U+  ISS. To avoid hypoglycemia. Will titrate as needed.  Close monitor   CKD II-III. Chronic leg  edema. On home regimen with diuretics. Will hold diuretics due to hypotension. Will resume in AM, as needed  History of DVT, A fib (on apixaban). Stable. Resume home regimen  COPD. No s/s of exacerbation. Cont home regimen   None;  if consultant consulted, please document name and whether formally or informally consulted  Code Status: full (must indicate code status--if unknown or must be presumed, indicate so) Family Communication: d/w patient, her family, ED (indicate person spoken with, if applicable, with phone number if by telephone) Disposition Plan: home 24-48 hrs.  (indicate anticipated LOS)  Time spent: >45 minutes   Kinnie Feil [facility] Pager [phone]  If 7PM-7AM, please contact night-coverage [email or web address] Password [email or web address] 09/15/2017, 8:59 AM

## 2017-09-15 NOTE — ED Triage Notes (Signed)
Pt brought in by [facility] for c/o altered loc; pt was sitting up in computer chair and confused; pt is alert and oriented x 4 with some slurred speech

## 2017-09-15 NOTE — Evaluation (Signed)
Physical Therapy Evaluation Patient Details Name: Stephanie [name] MRN: [id number] DOB: 14-Nov-1950 Today's Date: 09/15/2017   History of Present Illness  Stephanie [name] is a 67 y.o. female with PMH of HTN, DVT, CKD, A fib (on apixaban), COPD, DM, Chronic pain, presented with bilateral leg weakness. Patient states that she woke up around 2.00 am felt weak in her bilateral legs. She states that her right leg was more weaker than left leg. She reports mild slurred speech. She called her daughter and called EMS.  she was not able to get up frm her bed. She had an episode of sweating. No acute chest pain, no shortness of breath. Reportedly, glucose was around 218. She felt nauseated, no vomiting, no acute abdominal symptoms. She has mild cough, non productive.     Clinical Impression  Patient functioning near baseline for functional mobility and gait, demonstrates slow labored cadence with short step/stride length which is baseline per patient due to chronic low back pain with radiation down BLE to feet, patient c/o mild RUE tingling, but also has history of RUE rotator cuff surgery and was planning to receive OP physical therapy for this.  Patient will benefit from continued physical therapy in hospital and recommended venue below to increase strength, balance, endurance for safe ADLs and gait.     Follow Up Recommendations Home health PT    Equipment Recommendations  None recommended by PT    Recommendations for Other Services       Precautions / Restrictions Precautions Precautions: Fall Restrictions Weight Bearing Restrictions: No      Mobility  Bed Mobility Overal bed mobility: Modified Independent             General bed mobility comments: increased time  Transfers Overall transfer level: Needs assistance Equipment used: None Transfers: Sit to/from Stand;Stand Pivot Transfers Sit to Stand: Supervision Stand pivot transfers: Supervision       General transfer comment: slow  labored movement  Ambulation/Gait Ambulation/Gait assistance: Supervision Gait Distance (Feet): 60 Feet Assistive device: None Gait Pattern/deviations: Decreased step length - right;Decreased step length - left;Decreased stride length Gait velocity: slow   General Gait Details: demonstrates slow labored cadence with short step/stride length, no loss of balance, but had to take a sitting rest break due to low back discomfort before walking back to room  Stairs            Wheelchair Mobility    Modified Rankin (Stroke Patients Only)       Balance Overall balance assessment: Mild deficits observed, not formally tested                                           Pertinent Vitals/Pain Pain Assessment: 0-10 Pain Score: 5  Pain Location: headache, has history of chronic low back pain with radiation down to feet, and uses RW and SPC mostly due to back spasms Pain Descriptors / Indicators: Aching Pain Intervention(s): Limited activity within patient's tolerance;Monitored during session    Home Living Family/patient expects to be discharged to:: Private residence Living Arrangements: Children(daughter) Available Help at Discharge: Family Type of Home: House Home Access: Stairs to enter Entrance Stairs-Rails: None Entrance Stairs-Number of Steps: 1 Home Layout: One level Home Equipment: Environmental consultant - 2 wheels;Cane - single point;Shower seat;Bedside commode;Wheelchair - manual      Prior Function Level of Independence: Independent with assistive device(s)  Comments: household ambulator without AD, uses RW or cane for short community distances     Hand Dominance   Dominant Hand: Right    Extremity/Trunk Assessment   Upper Extremity Assessment Upper Extremity Assessment: Defer to OT evaluation    Lower Extremity Assessment Lower Extremity Assessment: Overall WFL for tasks assessed    Cervical / Trunk Assessment Cervical / Trunk Assessment:  Normal  Communication   Communication: No difficulties  Cognition Arousal/Alertness: Awake/alert Behavior During Therapy: WFL for tasks assessed/performed Overall Cognitive Status: Within Functional Limits for tasks assessed                                        General Comments      Exercises     Assessment/Plan    PT Assessment Patient needs continued PT services  PT Problem List Decreased activity tolerance;Decreased balance;Decreased mobility       PT Treatment Interventions Gait training;Stair training;Functional mobility training;Therapeutic activities;Therapeutic exercise;Patient/family education    PT Goals (Current goals can be found in the Care Plan section)  Acute Rehab PT Goals Patient Stated Goal: return home with daughter to assist PT Goal Formulation: With patient Time For Goal Achievement: 09/18/17 Potential to Achieve Goals: Good    Frequency 7X/week   Barriers to discharge        Co-evaluation               AM-PAC PT "6 Clicks" Daily Activity  Outcome Measure Difficulty turning over in bed (including adjusting bedclothes, sheets and blankets)?: None Difficulty moving from lying on back to sitting on the side of the bed? : None Difficulty sitting down on and standing up from a chair with arms (e.g., wheelchair, bedside commode, etc,.)?: None Help needed moving to and from a bed to chair (including a wheelchair)?: A Little Help needed walking in hospital room?: A Little Help needed climbing 3-5 steps with a railing? : A Lot 6 Click Score: 20    End of Session   Activity Tolerance: Patient tolerated treatment well;Patient limited by fatigue Patient left: in bed;with call bell/phone within reach;with bed alarm set Nurse Communication: Mobility status PT Visit Diagnosis: Unsteadiness on feet (R26.81);Other abnormalities of gait and mobility (R26.89);Muscle weakness (generalized) (M62.81)    Time: 0488-8916 PT Time  Calculation (min) (ACUTE ONLY): 26 min   Charges:   PT Evaluation $PT Eval Moderate Complexity: 1 Mod PT Treatments $Therapeutic Activity: 23-37 mins        3:30 PM, 09/15/17 Lonell Grandchild, MPT Physical Therapist with [facility] 336 [phone] office [phone] mobile phone

## 2017-09-15 NOTE — ED Notes (Signed)
Hospitalist at bedside 

## 2017-09-15 NOTE — ED Notes (Signed)
Per transport tech, patient going to ultrasound then MRI and they will transport patient to room 314 after tests are completed.

## 2017-09-16 ENCOUNTER — Observation Stay (HOSPITAL_BASED_OUTPATIENT_CLINIC_OR_DEPARTMENT_OTHER): Payer: Medicare Other

## 2017-09-16 DIAGNOSIS — E119 Type 2 diabetes mellitus without complications: Secondary | ICD-10-CM | POA: Diagnosis not present

## 2017-09-16 DIAGNOSIS — R531 Weakness: Secondary | ICD-10-CM

## 2017-09-16 DIAGNOSIS — R400 Somnolence: Secondary | ICD-10-CM

## 2017-09-16 DIAGNOSIS — R680 Hypothermia, not associated with low environmental temperature: Secondary | ICD-10-CM | POA: Diagnosis not present

## 2017-09-16 DIAGNOSIS — T68XXXA Hypothermia, initial encounter: Secondary | ICD-10-CM | POA: Diagnosis not present

## 2017-09-16 DIAGNOSIS — I959 Hypotension, unspecified: Secondary | ICD-10-CM | POA: Diagnosis not present

## 2017-09-16 DIAGNOSIS — G459 Transient cerebral ischemic attack, unspecified: Secondary | ICD-10-CM

## 2017-09-16 DIAGNOSIS — I5030 Unspecified diastolic (congestive) heart failure: Secondary | ICD-10-CM

## 2017-09-16 LAB — LIPID PANEL
Cholesterol: 204 mg/dL — ABNORMAL HIGH (ref 0–200)
HDL: 48 mg/dL (ref >40)
LDL CALC: 116 mg/dL — AB (ref 0–99)
Total CHOL/HDL Ratio: 4.3 RATIO
Triglycerides: 202 mg/dL — ABNORMAL HIGH (ref <150)
VLDL: 40 mg/dL (ref 0–40)

## 2017-09-16 LAB — HEMOGLOBIN A1C
Hgb A1c MFr Bld: 7.8 % — ABNORMAL HIGH (ref 4.8–5.6)
Mean Plasma Glucose: 177.16 mg/dL

## 2017-09-16 LAB — GLUCOSE, CAPILLARY
GLUCOSE-CAPILLARY: 232 mg/dL — AB (ref 70–99)
GLUCOSE-CAPILLARY: 351 mg/dL — AB (ref 70–99)

## 2017-09-16 LAB — HIV ANTIBODY (ROUTINE TESTING W REFLEX): HIV SCREEN 4TH GENERATION: NONREACTIVE

## 2017-09-16 LAB — ECHOCARDIOGRAM COMPLETE
HEIGHTINCHES: 67 in
Weight: 3703.73 oz

## 2017-09-16 LAB — TROPONIN I: Troponin I: <0.03 ng/mL (ref <0.03)

## 2017-09-16 MED ORDER — DULOXETINE HCL 30 MG PO CPEP: 30.0000 mg | ORAL_CAPSULE | Freq: Two times a day (BID) | ORAL | Status: DC

## 2017-09-16 MED ORDER — DICLOFENAC SODIUM 1 % TD GEL
2.0000 g | Freq: Four times a day (QID) | TRANSDERMAL | Status: DC
  Administered 2017-09-16: 2 g via TOPICAL
  Filled 2017-09-16: qty 100

## 2017-09-16 MED ORDER — DICLOFENAC SODIUM 1 % TD GEL: 2.0000 g | Freq: Two times a day (BID) | TRANSDERMAL | 0 refills | Status: DC

## 2017-09-16 MED ORDER — METOPROLOL TARTRATE 25 MG PO TABS: 25.0000 mg | ORAL_TABLET | Freq: Two times a day (BID) | ORAL | 0 refills | Status: DC

## 2017-09-16 MED ORDER — PRAVASTATIN SODIUM 80 MG PO TABS: 80.0000 mg | ORAL_TABLET | Freq: Every day | ORAL | 0 refills | Status: DC

## 2017-09-16 NOTE — Progress Notes (Signed)
Ambulated in hallway and room today.  Negative for any stoke symptoms.  IV removed and stroke education reviewed.  Discharge instructions reviewed and f/u appts made.  New scripts given.  To have echo done shortly before discharge.

## 2017-09-16 NOTE — Care Management Note (Signed)
Case Management Note  Patient Details  Name: Stephanie [name] MRN: [id number] Date of Birth: 1950-11-25  Subjective/Objective:  Admitted with TIA. Pt from home, ind with ADL's. Pt has insurance with drug coverage and PCP. Pt has no issues with transportation.                   Action/Plan: DC home today with referral for OP PT/OT. Pt prefers OP vs HH. She requests referral to [facility] in [place]. No DME needs. No further needs communicated to this CM.  Expected Discharge Date:  09/17/17               Expected Discharge Plan:  Home/Self Care  In-House Referral:  NA  Discharge planning Services  CM Consult  Post Acute Care Choice:  NA Choice offered to:  NA  Status of Service:  Completed, signed off  If discussed at Long Length of Stay Meetings, dates discussed:    Additional Comments:  Sherald Barge, RN 09/16/2017, 11:17 AM

## 2017-09-16 NOTE — Care Management Obs Status (Signed)
[facility] NOTIFICATION   Patient Details  Name: Stephanie [name] MRN: [id number] Date of Birth: 02-Oct-1950   Medicare Observation Status Notification Given:  Yes    Sherald Barge, RN 09/16/2017, 9:25 AM

## 2017-09-16 NOTE — Discharge Instructions (Addendum)
Transient Ischemic Attack °A transient ischemic attack (TIA) is a "warning stroke" that causes stroke-like symptoms. A TIA does not cause lasting damage to the brain. The symptoms of a TIA can happen fast and do not last long. It is important to know the symptoms of a TIA and what to do. This can help prevent stroke or death. °Follow these instructions at home: °· Take medicines only as told by your doctor. Make sure you understand all of the instructions. °· You may need to take aspirin or warfarin medicine. Warfarin needs to be taken exactly as told. °? Taking too much or too little warfarin is dangerous. Blood tests must be done as often as told by your doctor. A PT blood test measures how long it takes for blood to clot. Your PT is used to calculate another value called an INR. Your PT and INR help your doctor adjust your warfarin dosage. He or she will make sure you are taking the right amount. °? Food can cause problems with warfarin and affect the results of your blood tests. This is true for foods high in vitamin K. Eat the same amount of foods high in vitamin K each day. Foods high in vitamin K include spinach, kale, broccoli, cabbage, collard and turnip greens, Brussels sprouts, peas, cauliflower, seaweed, and parsley. Other foods high in vitamin K include beef and pork liver, green tea, and soybean oil. Eat the same amount of foods high in vitamin K each day. Avoid big changes in your diet. Tell your doctor before changing your diet. Talk to a food specialist (dietitian) if you have questions. °? Many medicines can cause problems with warfarin and affect your PT and INR. Tell your doctor about all medicines you take. This includes vitamins and dietary pills (supplements). Do not take or stop taking any prescribed or over-the-counter medicines unless your doctor tells you to. °? Warfarin can cause more bruising or bleeding. Hold pressure over any cuts for longer than normal. Talk to your doctor about other  side effects of warfarin. °? Avoid sports or activities that may cause injury or bleeding. °? Be careful when you shave, floss, or use sharp objects. °? Avoid or drink very little alcohol while taking warfarin. Tell your doctor if you change how much alcohol you drink. °? Tell your dentist and other doctors that you take warfarin before any procedures. °· Follow your diet program as told, if you are given one. °· Keep a healthy weight. °· Stay active. Try to get at least 30 minutes of activity on all or most days. °· Do not use any tobacco products, including cigarettes, chewing tobacco, or electronic cigarettes. If you need help quitting, ask your doctor. °· Limit alcohol intake to no more than 1 drink per day for nonpregnant women and 2 drinks per day for men. One drink equals 12 ounces of beer, 5 ounces of wine, or 1½ ounces of hard liquor. °· Do not abuse drugs. °· Keep your home safe so you do not fall. You can do this by: °? Putting grab bars in the bedroom and bathroom. °? Raising toilet seats. °? Putting a seat in the shower. °· Keep all follow-up visits as told by your doctor. This is important. °Contact a doctor if: °· Your personality changes. °· You have trouble swallowing. °· You have double vision. °· You are dizzy. °· You have a fever. °Get help right away if: °These symptoms may be an emergency. Do not wait to see if   the symptoms will go away. Get medical help right away. Call your local emergency services (911 in the [place] Do not drive yourself to the hospital. °· You have sudden weakness or lose feeling (go numb), especially on one side of the body. This can affect your: °? Face. °? Arm. °? Leg. °· You have sudden trouble walking. °· You have sudden trouble moving your arms or legs. °· You have sudden confusion. °· You have trouble talking. °· You have trouble understanding. °· You have sudden trouble seeing in one or both eyes. °· You lose your balance. °· Your movements are not smooth. °· You  have a sudden, very bad headache with no known cause. °· You have new chest pain. °· Your heartbeat is unsteady. °· You are partly or totally unaware of what is going on around you. ° °This information is not intended to replace advice given to you by your health care provider. Make sure you discuss any questions you have with your health care provider. °Document Released: 11/12/2007 Document Revised: 10/07/2015 Document Reviewed: 05/10/2013 °[employer]vier Interactive Patient Education © 2018 [employer] ° °

## 2017-09-16 NOTE — Evaluation (Signed)
Occupational Therapy Evaluation Patient Details Name: Stephanie [name] MRN: [id number] DOB: 09/20/1950 Today's Date: 09/16/2017    History of Present Illness Stephanie [name] is a 67 y.o. female with PMH of HTN, DVT, CKD, A fib (on apixaban), COPD, DM, Chronic pain, presented with bilateral leg weakness. Patient states that she woke up around 2.00 am felt weak in her bilateral legs. She states that her right leg was more weaker than left leg. She reports mild slurred speech. She called her daughter and called EMS.  she was not able to get up frm her bed. She had an episode of sweating. No acute chest pain, no shortness of breath. Reportedly, glucose was around 218. She felt nauseated, no vomiting, no acute abdominal symptoms. She has mild cough, non productive.    Clinical Impression   Difficult to assess affects of TIA on RUE due to recent RTC repair which was completed on 08/09/17. Patient reports that prior to her shoulder surgery she was modified independent with all daily tasks. Since surgery she does require minimal assistance from daughter for bathing and dressing due to limited ability of RUE. At this time, I recommend patient return home and receive Outpatient OT for RUE as she states was her plan before this hospitalization. Patient agrees with recommendation.     Follow Up Recommendations  Outpatient OT    Equipment Recommendations  None recommended by OT       Precautions / Restrictions Precautions Precautions: Fall;Shoulder Type of Shoulder Precautions: Recent Right RTC repair surgery completed on 08/09/17. Able to complete functional A/ROM in the frontal plane. No lifting. Sling on for comfort only.  Shoulder Interventions: For comfort;Shoulder sling/immobilizer Precaution Booklet Issued: No Restrictions Weight Bearing Restrictions: No             ADL either performed or assessed with clinical judgement   ADL Overall ADL's : At baseline         General ADL Comments: Patient  requires Min assist with washing LUE and with UBD due to recent right shoulder surgery.     Vision Baseline Vision/History: No visual deficits Patient Visual Report: No change from baseline              Pertinent Vitals/Pain Pain Assessment: 0-10 Pain Score: 8  Pain Location: right shoulder Pain Descriptors / Indicators: Aching Pain Intervention(s): Patient requesting pain meds-RN notified;Other (comment)(Pt reports that RN is aware of pain and seeing if any pain medication is available. )     Hand Dominance Right   Extremity/Trunk Assessment Upper Extremity Assessment Upper Extremity Assessment: RUE deficits/detail;LUE deficits/detail RUE Deficits / Details: Due to recent RTC repair patient is only able to complete A/ROM shoulder flexion and abduction to 90 degree. Full Internal and external rotation with shoulder adducted. Strength not assessed due shoulder precautions. Weak gross grasp.  LUE Deficits / Details: MMT: Shoulder and elbow: 4+/5. Functional A/ROM while seated in all ranges. Functional gross grasp.    Lower Extremity Assessment Lower Extremity Assessment: Defer to PT evaluation       Communication Communication Communication: No difficulties   Cognition Arousal/Alertness: Awake/alert Behavior During Therapy: WFL for tasks assessed/performed Overall Cognitive Status: Within Functional Limits for tasks assessed                     Home Living Family/patient expects to be discharged to:: Private residence Living Arrangements: Children(daughter) Available Help at Discharge: Family Type of Home: House Home Access: Stairs to enter [employer] of Steps:  1 Entrance Stairs-Rails: None Home Layout: One level     Bathroom Shower/Tub: Teacher, early years/pre: Handicapped height Bathroom Accessibility: Yes   Home Equipment: Walker - 2 wheels;Cane - single point;Shower seat;Bedside commode;Wheelchair - manual;Grab bars - tub/shower           Prior Functioning/Environment Level of Independence: Independent with assistive device(s)        Comments: household ambulator without AD, uses RW or cane for short community distances        OT Problem List: Decreased strength;Decreased range of motion;Impaired UE functional use;Pain      OT Treatment/Interventions:      OT Goals(Current goals can be found in the care plan section) Acute Rehab OT Goals Patient Stated Goal: return home with daughter to assist  OT Frequency:      AM-PAC PT "6 Clicks" Daily Activity     Outcome Measure Help from another person eating meals?: None Help from another person taking care of personal grooming?: None Help from another person toileting, which includes using toliet, bedpan, or urinal?: A Little Help from another person bathing (including washing, rinsing, drying)?: A Little Help from another person to put on and taking off regular upper body clothing?: A Little Help from another person to put on and taking off regular lower body clothing?: A Little 6 Click Score: 20   End of Session    Activity Tolerance: Patient tolerated treatment well Patient left: in bed;with call bell/phone within reach;with bed alarm set;Other (comment)(with physician)  OT Visit Diagnosis: Muscle weakness (generalized) (M62.81)                Time: 8921-1941 OT Time Calculation (min): 10 min Charges:  OT General Charges $OT Visit: 1 Visit OT Evaluation $OT Eval Low Complexity: [place], OTR/L,CBIS  [phone]   Charlaine Utsey, Clarene Duke 09/16/2017, 9:33 AM

## 2017-09-16 NOTE — Discharge Summary (Addendum)
Discharge Summary  Stephanie [name] KDX:[id number] DOB: [birth year]  PCP: Jani Gravel, MD  Admit date: 09/15/2017 Discharge date: 09/16/2017  Time spent: 25 minutes  Recommendations for Outpatient Follow-up:  1. Follow-up with your PCP 2. Follow-up with your orthopedic surgeon 3. Take your medications as prescribed  Discharge Diagnoses:  Active Hospital Problems   Diagnosis Date Noted  . TIA (transient ischemic attack) 09/15/2017    Resolved Hospital Problems  No resolved problems to display.    Discharge Condition: Stable  Diet recommendation: Resume previous diet  Vitals:   09/16/17 0748 09/16/17 1052  BP:  135/72  Pulse:  90  Resp:  16  Temp:  98.2 F (36.8 C)  SpO2: 95% 100%    History of present illness:   Stephanie [name] is a 67 y.o. female with PMH of HTN, DVT, CKD, A fib (on apixaban), COPD, DM, Chronic pain, presented with bilateral leg weakness. Patient states that she woke up around 2.00 am felt weak in her bilateral legs. She states that her right leg was more weaker than left leg. She reports mild slurred speech. She called her daughter called EMS.  she was not able to get up from her bed. She had an episode of sweating. No acute chest pain, no shortness of breath. Reportedly, glucose was around 218. She felt nauseated, no vomiting, no acute abdominal symptoms. She has mild cough, non productive.  She reports history of similar symptoms in the past. Denies any strokes or TIA in the past. Symptoms have gradually resolved in ED. Ct head: no acute infarcts. BP was 74/57 on admission. Improved after 2L NS. Hospitalist is called for admission.   Admitted for TIA.  09/16/2017: Patient seen and examined at her bedside.  She is alert and oriented x3.  Her only complaint is that of her right shoulder.   On the day of discharge, the patient was hemodynamically stable.  She will need to follow-up with her primary care provider, orthopedic surgery, post hospitalization and continue  physical therapy outpatient.    Hospital Course:  Active Problems:   TIA (transient ischemic attack)  TIA MRI brain without contrast done on 09/15/17 revealed no acute or likely significant finding.  Normal study with exception of a very small focus of gliosis affecting her right posterior frontal gyrus that could represent an old minimal small vessel cortical infarction. PT assessed Patient advised to continue physical therapy she opted to go to outpatient physical therapy Fall precautions  Hypotension, resolved  Prolonged QTC on EKG done on admission Independently reviewed twelve-lead EKG done in admission revealed sinus rhythm with rate of 86 and QTC of 529 Avoid QTC prolonging agents Symptomatic, no chest pain or palpitations Troponin done on 09/16/2017 less than 0.03  Type 2 diabetes Hemoglobin A1c 7.7 Continue home anti-glycemic medications Follow-up with your primary care provider post hospitalization  Hyperlipidemia Last LDL 116 from 09/16/17 Continue statin  History of bilateral lower extremity DVT On Eliquis  COPD Stable Continue COPD medications  Hypothyroidism Continue levothyroxine  GERD Continue Protonix  Chronic anxiety/depression Continue Cymbalta        Procedures:  None  Consultations:  PT  Discharge Exam: BP 135/72   Pulse 90   Temp 98.2 F (36.8 C) (Oral)   Resp 16   Ht 5' 7"  (1.702 m)   Wt 105 kg (231 lb 7.7 oz)   SpO2 100%   BMI 36.26 kg/m  . General: 67 y.o. year-old female well developed well nourished in no  acute distress.  Alert and oriented x3. . Cardiovascular: Regular rate and rhythm with no rubs or gallops.  No thyromegaly or JVD noted.   Marland Kitchen Respiratory: Clear to auscultation with no wheezes or rales. Good inspiratory effort. . Abdomen: Soft nontender nondistended with normal bowel sounds x4 quadrants. . Musculoskeletal: No lower extremity edema. 2/4 pulses in all 4 extremities. . Skin: No ulcerative lesions noted  or rashes, . Psychiatry: Mood is appropriate for condition and setting  Discharge Instructions You were cared for by a hospitalist during your hospital stay. If you have any questions about your discharge medications or the care you received while you were in the hospital after you are discharged, you can call the unit and asked to speak with the hospitalist on call if the hospitalist that took care of you is not available. Once you are discharged, your primary care physician will handle any further medical issues. Please note that NO REFILLS for any discharge medications will be authorized once you are discharged, as it is imperative that you return to your primary care physician (or establish a relationship with a primary care physician if you do not have one) for your aftercare needs so that they can reassess your need for medications and monitor your lab values.  Discharge Instructions    Ambulatory referral to Occupational Therapy   Complete by:  As directed    Ambulatory referral to Physical Therapy   Complete by:  As directed      Allergies as of 09/16/2017      Reactions   Tetracyclines & Related Anaphylaxis   Banana Hives, Nausea And Vomiting   Penicillins Rash, Other (See Comments)   Has patient had a PCN reaction causing immediate rash, facial/tongue/throat swelling, SOB or lightheadedness with hypotension: No Has patient had a PCN reaction causing severe rash involving mucus membranes or skin necrosis: No Has patient had a PCN reaction that required hospitalization No Has patient had a PCN reaction occurring within the last 10 years: No If all of the above answers are "NO", then may proceed with Cephalosporin use.      Medication List    STOP taking these medications   diphenhydrAMINE 25 MG tablet Commonly known as:  BENADRYL   HUMALOG KWIKPEN 100 UNIT/ML KiwkPen Generic drug:  insulin lispro   LANTUS SOLOSTAR 100 UNIT/ML Solostar Pen Generic drug:  Insulin Glargine     lidocaine 5 % ointment Commonly known as:  XYLOCAINE   lisinopril 2.5 MG tablet Commonly known as:  PRINIVIL,ZESTRIL   mometasone 0.1 % cream Commonly known as:  ELOCON   NOVOFINE 32G X 6 MM Misc Generic drug:  Insulin Pen Needle   tiZANidine 4 MG tablet Commonly known as:  ZANAFLEX   traMADol 50 MG tablet Commonly known as:  ULTRAM     TAKE these medications   acetaminophen 500 MG tablet Commonly known as:  TYLENOL Take 1,000 mg by mouth 3 (three) times daily.   PROAIR RESPICLICK 111 (90 Base) MCG/ACT Aepb Generic drug:  Albuterol Sulfate Inhale 2 puffs into the lungs every 6 (six) hours. What changed:  Another medication with the same name was removed. Continue taking this medication, and follow the directions you see here.   albuterol 108 (90 Base) MCG/ACT inhaler Commonly known as:  PROVENTIL HFA;VENTOLIN HFA Inhale 2 puffs into the lungs every 6 (six) hours as needed for wheezing or shortness of breath. What changed:  Another medication with the same name was removed. Continue taking this medication,  and follow the directions you see here.   apixaban 5 MG Tabs tablet Commonly known as:  ELIQUIS Take 10 mg by mouth BID through Friday 5/19 evening. Take 76m by mouth BID starting 5/20 AM. What changed:    how much to take  how to take this  when to take this  additional instructions   ARNUITY ELLIPTA 100 MCG/ACT Aepb Generic drug:  Fluticasone Furoate Inhale 1 puff into the lungs daily.   cetirizine 10 MG tablet Commonly known as:  ZYRTEC Take 10 mg by mouth daily.   CINNAMON PO Take 2-3 capsules by mouth 2 (two) times daily as needed (high blood sugar).   clobetasol ointment 0.05 % Commonly known as:  TEMOVATE Apply 1 application topically 2 (two) times daily as needed (rash).   Cod Liver Oil Caps Take 1 capsule by mouth at bedtime.   dexlansoprazole 60 MG capsule Commonly known as:  DEXILANT Take 1 capsule (60 mg total) by mouth daily.    diclofenac sodium 1 % Gel Commonly known as:  VOLTAREN Apply 2 g topically 2 (two) times daily.   Doxepin HCl 5 % Crea apply 2 grams (2 grams=2 inches) to affected area(s) 3 times daily. Takes as needed   DULoxetine 30 MG capsule Commonly known as:  CYMBALTA Take 1 capsule (30 mg total) by mouth 2 (two) times daily.   fluticasone 27.5 MCG/SPRAY nasal spray Commonly known as:  VERAMYST Place 2 sprays into the nose daily.   HYDROcodone-acetaminophen 5-325 MG tablet Commonly known as:  NORCO Take 1-2 tablets by mouth every 6 (six) hours as needed for moderate pain.   hydrOXYzine 25 MG tablet Commonly known as:  ATARAX/VISTARIL Take 25 mg by mouth every 8 (eight) hours as needed for itching.   levothyroxine 100 MCG tablet Commonly known as:  SYNTHROID, LEVOTHROID Take 100 mcg by mouth daily before breakfast.   lubiprostone 8 MCG capsule Commonly known as:  AMITIZA Take 1 capsule (8 mcg total) by mouth 2 (two) times daily with a meal.   LYRICA 75 MG capsule Generic drug:  pregabalin Take 75 mg by mouth 2 (two) times daily.   Melatonin 10 MG Tabs Take 10 mg by mouth at bedtime.   methocarbamol 500 MG tablet Commonly known as:  ROBAXIN Take 1 tablet (500 mg total) by mouth 3 (three) times daily as needed.   metolazone 5 MG tablet Commonly known as:  ZAROXOLYN Take 1 tablet (5 mg total) by mouth 2 (two) times daily.   metoprolol tartrate 25 MG tablet Commonly known as:  LOPRESSOR Take 1 tablet (25 mg total) by mouth 2 (two) times daily. What changed:  Another medication with the same name was removed. Continue taking this medication, and follow the directions you see here.   pravastatin 80 MG tablet Commonly known as:  PRAVACHOL Take 1 tablet (80 mg total) by mouth at bedtime.   TESSALON PERLES 100 MG capsule Generic drug:  benzonatate Take 1-2 capsules by mouth daily as needed (cough).   torsemide 20 MG tablet Commonly known as:  DEMADEX Take 2 tablets (40 mg  total) by mouth daily.   TRESIBA FLEXTOUCH 100 UNIT/ML Sopn FlexTouch Pen Generic drug:  insulin degludec Inject 90 Units into the skin daily.   TRULICITY 1.5 MBJ/6.2GBSopn Generic drug:  Dulaglutide Inject 1.5 mg into the skin once a week.   vitamin B-12 1000 MCG tablet Commonly known as:  CYANOCOBALAMIN Take 1,000 mcg by mouth daily.   vitamin C 1000 MG  tablet Take 1,000 mg by mouth daily.   Vitamin D 2000 units tablet Take 4,000 Units by mouth daily.   vitamin E 200 UNIT capsule Take 200 Units by mouth daily.      Allergies  Allergen Reactions  . Tetracyclines & Related Anaphylaxis  . Banana Hives and Nausea And Vomiting  . Penicillins Rash and Other (See Comments)    Has patient had a PCN reaction causing immediate rash, facial/tongue/throat swelling, SOB or lightheadedness with hypotension: No Has patient had a PCN reaction causing severe rash involving mucus membranes or skin necrosis: No Has patient had a PCN reaction that required hospitalization No Has patient had a PCN reaction occurring within the last 10 years: No If all of the above answers are "NO", then may proceed with Cephalosporin use.    Follow-up Information    Jani Gravel, MD. Call in 1 day(s).   Specialty:  Internal Medicine Why:  Please call for an appointment. Contact information: [address] [place] [address] [phone]        [facility]. Call in 1 day(s).   Specialty:  Rehabilitation Why:  Please call for an appointment. Contact information: [address] [id number] mc [place] [phone]           The results of significant diagnostics from this hospitalization (including imaging, microbiology, ancillary and laboratory) are listed below for reference.    Significant Diagnostic Studies: Ct Head Wo Contrast  Result Date: 09/15/2017 CLINICAL DATA:  Initial evaluation for acute altered  mental status. EXAM: CT HEAD WITHOUT CONTRAST TECHNIQUE: Contiguous axial images were obtained from the base of the skull through the vertex without intravenous contrast. COMPARISON:  Prior MRI from 12/25/2015. FINDINGS: Brain: Cerebral volume within normal limits for patient age. No evidence for acute intracranial hemorrhage. No findings to suggest acute large vessel territory infarct. No mass lesion, midline shift, or mass effect. Ventricles are normal in size without evidence for hydrocephalus. No extra-axial fluid collection identified. Vascular: No hyperdense vessel identified. Skull: Scalp soft tissues demonstrate no acute abnormality. Calvarium intact. Sinuses/Orbits: Globes and orbital soft tissues within normal limits. Visualized paranasal sinuses are clear. No mastoid effusion. IMPRESSION: Negative head CT.  No acute intracranial abnormality identified. Electronically Signed   By: Jeannine Boga M.D.   On: 09/15/2017 05:48   Mr Brain Wo Contrast  Result Date: 09/15/2017 CLINICAL DATA:  Altered level of consciousness. Confusion. Slurred speech. EXAM: MRI HEAD WITHOUT CONTRAST TECHNIQUE: Multiplanar, multiecho pulse sequences of the brain and surrounding structures were obtained without intravenous contrast. COMPARISON:  Head CT same day.  MRI 12/25/2015. FINDINGS: Brain: Diffusion imaging does not show any acute or subacute infarction. The brainstem and cerebellum are normal. Cerebral hemispheres are normal except for a questionable small focus of gliosis along a right posterior frontal gyrus which could represent an old small vessel cortical infarction. Otherwise no abnormality. No mass lesion, hemorrhage, hydrocephalus or extra-axial collection. Vascular: Major vessels at the base of the brain show flow. Skull and upper cervical spine: Negative Sinuses/Orbits: Clear/normal Other: None IMPRESSION: No acute or likely significant finding. Normal study with exception of a very small focus of gliosis  affecting a aright posterior frontal gyrus that could represent an old minimal small-vessel cortical infarction. Electronically Signed   By: Nelson Chimes M.D.   On: 09/15/2017 10:35   US Carotid Bilateral (at [facility] And [facility] Only)  Result Date: 09/15/2017 CLINICAL DATA:  Bilateral lower extremity weakness.  Near syncope. EXAM: BILATERAL CAROTID DUPLEX ULTRASOUND TECHNIQUE: Pearline Cables scale imaging, color Doppler and duplex ultrasound were performed of bilateral carotid and vertebral arteries in the neck. COMPARISON:  None. FINDINGS: Criteria: Quantification of carotid stenosis is based on velocity parameters that correlate the residual internal carotid diameter with NASCET-based stenosis levels, using the diameter of the distal internal carotid lumen as the denominator for stenosis measurement. The following velocity measurements were obtained: RIGHT ICA:  93 cm/sec CCA:  071 cm/sec SYSTOLIC ICA/CCA RATIO:  0.9 DIASTOLIC ICA/CCA RATIO: ECA:  91 cm/sec LEFT ICA:  87 cm/sec CCA:  219 cm/sec SYSTOLIC ICA/CCA RATIO:  0.8 DIASTOLIC ICA/CCA RATIO: ECA:  105 cm/sec RIGHT CAROTID ARTERY: Little if any plaque in the bulb. Low resistance internal carotid Doppler pattern. RIGHT VERTEBRAL ARTERY:  Antegrade. LEFT CAROTID ARTERY: Mild soft smooth plaque in the posterior bulb. Low resistance internal carotid Doppler pattern is preserved. LEFT VERTEBRAL ARTERY:  Antegrade. IMPRESSION: Less than 50% stenosis in the right and left internal carotid arteries. Electronically Signed   By: Marybelle Killings M.D.   On: 09/15/2017 10:17   Dg Chest Port 1 View  Result Date: 09/15/2017 CLINICAL DATA:  Altered mental status EXAM: PORTABLE CHEST 1 VIEW COMPARISON:  02/10/2014 FINDINGS: Artifact overlies the chest. Heart size is normal. Mediastinal shadows are normal. Lungs are clear. The vascularity is normal. No effusions. No acute bone finding. Previous right shoulder replacement. IMPRESSION: No active disease. Electronically Signed   By: Nelson Chimes M.D.   On: 09/15/2017 08:28    Microbiology: No results found for this or any previous visit (from the past 240 hour(s)).   Labs: Basic Metabolic Panel: Recent Labs  Lab 09/15/17 0407  NA 136  K 4.0  CL 101  CO2 25  GLUCOSE 183*  BUN 23  CREATININE 1.68*  CALCIUM 9.6   Liver Function Tests: Recent Labs  Lab 09/15/17 0407  AST 66*  ALT 71*  ALKPHOS 490*  BILITOT 0.7  PROT 8.8*  ALBUMIN 3.2*   No results for input(s): LIPASE, AMYLASE in the last 168 hours. Recent Labs  Lab 09/15/17 0607  AMMONIA 39*   CBC: Recent Labs  Lab 09/15/17 0407  WBC 13.4*  HGB 11.9*  HCT 38.1  MCV 78.7  PLT 259   Cardiac Enzymes: Recent Labs  Lab 09/16/17 0805  TROPONINI <0.03   BNP: BNP (last 3 results) No results for input(s): BNP in the last 8760 hours.  ProBNP (last 3 results) No results for input(s): PROBNP in the last 8760 hours.  CBG: Recent Labs  Lab 09/15/17 0413 09/15/17 2104 09/16/17 0741 09/16/17 1112  GLUCAP 173* 277* 232* 351*       Signed:  Kayleen Memos, MD [facility] 09/16/2017, 11:21 AM

## 2017-09-16 NOTE — Progress Notes (Signed)
Physical Therapy Treatment Patient Details Name: Stephanie [name] MRN: [id number] DOB: 31-Jul-1950 Today's Date: 09/16/2017    History of Present Illness Stephanie [name] is a 67 y.o. female with PMH of HTN, DVT, CKD, A fib (on apixaban), COPD, DM, Chronic pain, presented with bilateral leg weakness. Patient states that she woke up around 2.00 am felt weak in her bilateral legs. She states that her right leg was more weaker than left leg. She reports mild slurred speech. She called her daughter and called EMS.  she was not able to get up frm her bed. She had an episode of sweating. No acute chest pain, no shortness of breath. Reportedly, glucose was around 218. She felt nauseated, no vomiting, no acute abdominal symptoms. She has mild cough, non productive.     PT Comments    Patient demonstrates improvement in balance for gait training, able to ambulate on level, inclined, declined surfaces and up/down steps in stairwell without loss of balance.  Patient to be discharged home today and discharged from physical therapy to care of nursing with recommendations below.   Follow Up Recommendations  Home health PT     Equipment Recommendations  None recommended by PT    Recommendations for Other Services       Precautions / Restrictions Precautions Precautions: None Type of Shoulder Precautions: Recent Right RTC repair surgery completed on 08/09/17. Able to complete functional A/ROM in the frontal plane. No lifting. Sling on for comfort only.  Shoulder Interventions: For comfort;Shoulder sling/immobilizer Precaution Booklet Issued: No Restrictions Weight Bearing Restrictions: No    Mobility  Bed Mobility Overal bed mobility: Modified Independent                Transfers Overall transfer level: Modified independent Equipment used: None Transfers: Sit to/from [facility] Sit to Stand: Modified independent (Device/Increase time) Stand pivot transfers: Modified independent  (Device/Increase time)          Ambulation/Gait Ambulation/Gait assistance: Modified independent (Device/Increase time) Gait Distance (Feet): 150 Feet Assistive device: None Gait Pattern/deviations: Decreased step length - right;Decreased step length - left;Decreased stride length Gait velocity: decreased   General Gait Details: grossly WFL with slightly labored slow cadence without loss of balance on level, inclined, and declined surfaces   Stairs Stairs: Yes Stairs assistance: Modified independent (Device/Increase time) Stair Management: One rail Left;One rail Right;Alternating pattern Number of Stairs: 3 General stair comments: demonstrates good return for going up/down 3 steps using 1 siderail without loss of balance   Wheelchair Mobility    Modified Rankin (Stroke Patients Only)       Balance Overall balance assessment: Mild deficits observed, not formally tested                                          Cognition Arousal/Alertness: Awake/alert Behavior During Therapy: WFL for tasks assessed/performed Overall Cognitive Status: Within Functional Limits for tasks assessed                                        Exercises      General Comments        Pertinent Vitals/Pain Pain Assessment: 0-10 Pain Score: 8  Pain Location: right shoulder Pain Descriptors / Indicators: Aching Pain Intervention(s): Patient requesting pain meds-RN notified;Other (comment)(Pt reports that RN  is aware of pain and seeing if any pain medication is available. )    Home Living Family/patient expects to be discharged to:: Private residence Living Arrangements: Children(daughter) Available Help at Discharge: Family Type of Home: House Home Access: Stairs to enter Entrance Stairs-Rails: None Home Layout: One level [place]: Environmental consultant - 2 wheels;Cane - single point;Shower seat;Bedside commode;Wheelchair - manual;Grab bars - tub/shower       Prior Function Level of Independence: Independent with assistive device(s)      Comments: household ambulator without AD, uses RW or cane for short community distances   PT Goals (current goals can now be found in the care plan section) Acute Rehab PT Goals Patient Stated Goal: return home with daughter to assist PT Goal Formulation: With patient Time For Goal Achievement: 09/18/17 Potential to Achieve Goals: Good Progress towards PT goals: Goals met/education completed, patient discharged from PT    Frequency           PT Plan Other (comment)(Patient to be discharged to care of nursing)    Co-evaluation              AM-PAC PT "6 Clicks" Daily Activity  Outcome Measure  Difficulty turning over in bed (including adjusting bedclothes, sheets and blankets)?: None Difficulty moving from lying on back to sitting on the side of the bed? : None Difficulty sitting down on and standing up from a chair with arms (e.g., wheelchair, bedside commode, etc,.)?: None Help needed moving to and from a bed to chair (including a wheelchair)?: None Help needed walking in hospital room?: None Help needed climbing 3-5 steps with a railing? : None 6 Click Score: 24    End of Session   Activity Tolerance: Patient tolerated treatment well;Patient limited by fatigue Patient left: in bed;with call bell/phone within reach(seated at bedside) Nurse Communication: Mobility status PT Visit Diagnosis: Unsteadiness on feet (R26.81);Other abnormalities of gait and mobility (R26.89);Muscle weakness (generalized) (M62.81)     Time: 1030-1057 PT Time Calculation (min) (ACUTE ONLY): 27 min  Charges:  $Therapeutic Activity: 23-37 mins                     11:49 AM, 09/16/17 Lonell Grandchild, MPT Physical Therapist with [facility] [phone] office [phone] mobile phone

## 2017-09-16 NOTE — Progress Notes (Signed)
*  PRELIMINARY RESULTS* Echocardiogram 2D Echocardiogram has been performed.  Stephanie [name] 09/16/2017, 2:11 PM

## 2017-09-20 ENCOUNTER — Inpatient Hospital Stay (HOSPITAL_COMMUNITY): Payer: Medicare Other | Attending: Hematology

## 2017-09-20 DIAGNOSIS — Z862 Personal history of diseases of the blood and blood-forming organs and certain disorders involving the immune mechanism: Secondary | ICD-10-CM | POA: Insufficient documentation

## 2017-09-20 DIAGNOSIS — Z7901 Long term (current) use of anticoagulants: Secondary | ICD-10-CM | POA: Insufficient documentation

## 2017-09-20 DIAGNOSIS — M25511 Pain in right shoulder: Secondary | ICD-10-CM | POA: Diagnosis not present

## 2017-09-20 DIAGNOSIS — Z79899 Other long term (current) drug therapy: Secondary | ICD-10-CM | POA: Diagnosis not present

## 2017-09-20 DIAGNOSIS — M797 Fibromyalgia: Secondary | ICD-10-CM | POA: Insufficient documentation

## 2017-09-20 DIAGNOSIS — G894 Chronic pain syndrome: Secondary | ICD-10-CM | POA: Diagnosis not present

## 2017-09-20 DIAGNOSIS — I10 Essential (primary) hypertension: Secondary | ICD-10-CM | POA: Diagnosis not present

## 2017-09-20 DIAGNOSIS — Z86718 Personal history of other venous thrombosis and embolism: Secondary | ICD-10-CM | POA: Insufficient documentation

## 2017-09-20 DIAGNOSIS — S161XXA Strain of muscle, fascia and tendon at neck level, initial encounter: Secondary | ICD-10-CM | POA: Diagnosis not present

## 2017-09-20 DIAGNOSIS — E1165 Type 2 diabetes mellitus with hyperglycemia: Secondary | ICD-10-CM | POA: Diagnosis not present

## 2017-09-20 DIAGNOSIS — D472 Monoclonal gammopathy: Secondary | ICD-10-CM | POA: Insufficient documentation

## 2017-09-20 LAB — CBC WITH DIFFERENTIAL/PLATELET
BASOS ABS: 0 10*3/uL (ref 0.0–0.1)
BASOS PCT: 0 %
EOS ABS: 0.4 10*3/uL (ref 0.0–0.7)
EOS PCT: 3 %
HCT: 37.2 % (ref 36.0–46.0)
Hemoglobin: 12.1 g/dL (ref 12.0–15.0)
LYMPHS PCT: 30 %
Lymphs Abs: 3.3 10*3/uL (ref 0.7–4.0)
MCH: 25.2 pg — ABNORMAL LOW (ref 26.0–34.0)
MCHC: 32.5 g/dL (ref 30.0–36.0)
MCV: 77.3 fL — ABNORMAL LOW (ref 78.0–100.0)
MONO ABS: 0.7 10*3/uL (ref 0.1–1.0)
Monocytes Relative: 6 %
Neutro Abs: 6.7 10*3/uL (ref 1.7–7.7)
Neutrophils Relative %: 61 %
PLATELETS: 285 10*3/uL (ref 150–400)
RBC: 4.81 MIL/uL (ref 3.87–5.11)
RDW: 14.9 % (ref 11.5–15.5)
WBC: 11 10*3/uL — AB (ref 4.0–10.5)

## 2017-09-20 LAB — COMPREHENSIVE METABOLIC PANEL
ALBUMIN: 3.4 g/dL — AB (ref 3.5–5.0)
ALT: 59 U/L — ABNORMAL HIGH (ref 0–44)
ANION GAP: 10 (ref 5–15)
AST: 36 U/L (ref 15–41)
Alkaline Phosphatase: 453 U/L — ABNORMAL HIGH (ref 38–126)
BUN: 50 mg/dL — ABNORMAL HIGH (ref 8–23)
CALCIUM: 8.7 mg/dL — AB (ref 8.9–10.3)
CO2: 27 mmol/L (ref 22–32)
CREATININE: 1.4 mg/dL — AB (ref 0.44–1.00)
Chloride: 93 mmol/L — ABNORMAL LOW (ref 98–111)
GFR calc Af Amer: 44 mL/min — ABNORMAL LOW (ref >60)
GFR calc non Af Amer: 38 mL/min — ABNORMAL LOW (ref >60)
Glucose, Bld: 316 mg/dL — ABNORMAL HIGH (ref 70–99)
POTASSIUM: 3.3 mmol/L — AB (ref 3.5–5.1)
SODIUM: 130 mmol/L — AB (ref 135–145)
Total Bilirubin: 0.8 mg/dL (ref 0.3–1.2)
Total Protein: 8.4 g/dL — ABNORMAL HIGH (ref 6.5–8.1)

## 2017-09-20 LAB — IRON AND TIBC
Iron: 110 ug/dL (ref 28–170)
Saturation Ratios: 39 % — ABNORMAL HIGH (ref 10.4–31.8)
TIBC: 281 ug/dL (ref 250–450)
UIBC: 171 ug/dL

## 2017-09-20 LAB — FERRITIN: FERRITIN: 554 ng/mL — AB (ref 11–307)

## 2017-09-21 ENCOUNTER — Ambulatory Visit (HOSPITAL_COMMUNITY): Payer: Medicare Other | Admitting: Hematology

## 2017-09-21 DIAGNOSIS — Z471 Aftercare following joint replacement surgery: Secondary | ICD-10-CM | POA: Diagnosis not present

## 2017-09-21 DIAGNOSIS — Z96611 Presence of right artificial shoulder joint: Secondary | ICD-10-CM | POA: Diagnosis not present

## 2017-09-21 LAB — PROTEIN ELECTROPHORESIS, SERUM
A/G Ratio: 0.7 (ref 0.7–1.7)
ALPHA-1-GLOBULIN: 0.3 g/dL (ref 0.0–0.4)
Albumin ELP: 2.9 g/dL (ref 2.9–4.4)
Alpha-2-Globulin: 1 g/dL (ref 0.4–1.0)
BETA GLOBULIN: 1.2 g/dL (ref 0.7–1.3)
GAMMA GLOBULIN: 1.9 g/dL — AB (ref 0.4–1.8)
Globulin, Total: 4.4 g/dL — ABNORMAL HIGH (ref 2.2–3.9)
M-SPIKE, %: 1.5 g/dL — AB
TOTAL PROTEIN ELP: 7.3 g/dL (ref 6.0–8.5)

## 2017-09-21 LAB — KAPPA/LAMBDA LIGHT CHAINS
KAPPA FREE LGHT CHN: 662.3 mg/L — AB (ref 3.3–19.4)
KAPPA, LAMDA LIGHT CHAIN RATIO: 35.8 — AB (ref 0.26–1.65)
Lambda free light chains: 18.5 mg/L (ref 5.7–26.3)

## 2017-09-22 ENCOUNTER — Ambulatory Visit (HOSPITAL_COMMUNITY): Payer: Medicare Other

## 2017-09-22 ENCOUNTER — Telehealth (HOSPITAL_COMMUNITY): Payer: Self-pay | Admitting: Internal Medicine

## 2017-09-22 NOTE — Telephone Encounter (Signed)
09/22/17  message left that patient was not able to make it today but does want to reschedule

## 2017-09-28 ENCOUNTER — Emergency Department (HOSPITAL_COMMUNITY)
Admission: EM | Admit: 2017-09-28 | Discharge: 2017-09-29 | Disposition: A | Discharge status: Home / Self Care | Payer: Medicare Other | Attending: Emergency Medicine | Admitting: Emergency Medicine

## 2017-09-28 ENCOUNTER — Other Ambulatory Visit: Payer: Self-pay

## 2017-09-28 ENCOUNTER — Encounter (HOSPITAL_COMMUNITY): Payer: Self-pay | Admitting: Emergency Medicine

## 2017-09-28 DIAGNOSIS — E119 Type 2 diabetes mellitus without complications: Secondary | ICD-10-CM | POA: Diagnosis not present

## 2017-09-28 DIAGNOSIS — J449 Chronic obstructive pulmonary disease, unspecified: Secondary | ICD-10-CM | POA: Insufficient documentation

## 2017-09-28 DIAGNOSIS — Z79899 Other long term (current) drug therapy: Secondary | ICD-10-CM | POA: Insufficient documentation

## 2017-09-28 DIAGNOSIS — Z96612 Presence of left artificial shoulder joint: Secondary | ICD-10-CM | POA: Diagnosis not present

## 2017-09-28 DIAGNOSIS — K029 Dental caries, unspecified: Secondary | ICD-10-CM | POA: Diagnosis not present

## 2017-09-28 DIAGNOSIS — Z87891 Personal history of nicotine dependence: Secondary | ICD-10-CM | POA: Diagnosis not present

## 2017-09-28 DIAGNOSIS — Z794 Long term (current) use of insulin: Secondary | ICD-10-CM | POA: Insufficient documentation

## 2017-09-28 DIAGNOSIS — E039 Hypothyroidism, unspecified: Secondary | ICD-10-CM | POA: Diagnosis not present

## 2017-09-28 DIAGNOSIS — I10 Essential (primary) hypertension: Secondary | ICD-10-CM | POA: Diagnosis not present

## 2017-09-28 DIAGNOSIS — R519 Headache, unspecified: Secondary | ICD-10-CM

## 2017-09-28 DIAGNOSIS — R51 Headache: Secondary | ICD-10-CM

## 2017-09-28 DIAGNOSIS — R220 Localized swelling, mass and lump, head: Secondary | ICD-10-CM | POA: Diagnosis not present

## 2017-09-28 NOTE — ED Triage Notes (Signed)
Pt c/o facial pain from left ear to jaw x one week.

## 2017-09-29 ENCOUNTER — Emergency Department (HOSPITAL_COMMUNITY): Payer: Medicare Other

## 2017-09-29 DIAGNOSIS — R51 Headache: Secondary | ICD-10-CM | POA: Diagnosis not present

## 2017-09-29 MED ORDER — HYDROCODONE-ACETAMINOPHEN 5-325 MG PO TABS
2.0000 | ORAL_TABLET | Freq: Once | ORAL | Status: AC
  Administered 2017-09-29: 2 via ORAL
  Filled 2017-09-29: qty 2

## 2017-09-29 MED ORDER — HYDROCODONE-ACETAMINOPHEN 5-325 MG PO TABS: 1.0000 | ORAL_TABLET | ORAL | 0 refills | Status: DC | PRN

## 2017-09-29 MED ORDER — ONDANSETRON HCL 4 MG PO TABS
4.0000 mg | ORAL_TABLET | Freq: Once | ORAL | Status: AC
  Administered 2017-09-29: 4 mg via ORAL
  Filled 2017-09-29: qty 1

## 2017-09-29 MED ORDER — CLINDAMYCIN HCL 150 MG PO CAPS
300.0000 mg | ORAL_CAPSULE | Freq: Once | ORAL | Status: AC
  Administered 2017-09-29: 300 mg via ORAL
  Filled 2017-09-29: qty 2

## 2017-09-29 MED ORDER — IBUPROFEN 400 MG PO TABS
400.0000 mg | ORAL_TABLET | Freq: Once | ORAL | Status: DC
  Filled 2017-09-29: qty 1

## 2017-09-29 MED ORDER — CLINDAMYCIN HCL 150 MG PO CAPS: ORAL_CAPSULE | ORAL | 0 refills | Status: DC

## 2017-09-29 NOTE — ED Provider Notes (Signed)
[facility] EMERGENCY DEPARTMENT Provider Note   CSN: [id number] Arrival date & time: 09/28/17  2318     History   Chief Complaint Chief Complaint  Patient presents with  . Facial Pain    HPI Stephanie [name] is a 67 y.o. female.  Patient is a 67 year old female who presents to the emergency department with a complaint of facial pain on the left.  The patient states that for over a week she has been having left-sided facial pain.  She now has some swelling adjacent to the lower ear.  The pain runs primarily from her neck and shoulder area up to the temporal area.  She is noted some puffiness about the left face.  She says it hurts to touch.  And it also hurts behind her ear.  She has not had any injury or trauma to the area.  She has not had any recent dental issues or dental procedures done.  She presents now for assistance with this issue.  The history is provided by the patient.    Past Medical History:  Diagnosis Date  . Anemia   . Arthritis   . Asthma   . COPD (chronic obstructive pulmonary disease) ([place])   . Deep vein thrombosis (DVT) of both lower extremities ([place]) 06/27/2015  . Diabetes mellitus   . Fibromyalgia   . GERD (gastroesophageal reflux disease)   . H/O hiatal hernia   . Hypercholesteremia   . Hypertension   . Hyperthyroidism   . IBS (irritable bowel syndrome)   . Inner ear disease   . 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 PONV (postoperative nausea and vomiting)   . Tachycardia     Patient Active Problem List   Diagnosis Date Noted  . TIA (transient ischemic attack) 09/15/2017  . Localized osteoarthritis of right shoulder 08/08/2017  . Dysphagia 12/16/2015  . 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 Constipation 10/27/2015  . Diarrhea 08/02/2015  . Elevated alkaline phosphatase level 07/23/2015  . Heme positive stool 07/23/2015  . Hypoglycemia 07/01/2015  . Deep vein thrombosis (DVT) of both  lower extremities ([place]) 06/27/2015  . 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 Maculopapular rash, generalized 06/27/2015  . Alopecia 06/27/2015  . Hypothyroidism 06/27/2015  . Diverticulitis 10/19/2013  . Tobacco abuse 10/19/2013  . Obesity 10/19/2013  . Diabetes ([place]) 10/19/2013  . Hypertension   . Anemia   . GERD (gastroesophageal reflux disease)   . COPD (chronic obstructive pulmonary disease) ([place])     Past Surgical History:  Procedure Laterality Date  . ABDOMINAL HYSTERECTOMY  partial  . CARPAL TUNNEL RELEASE Right 1991  . CATARACT EXTRACTION W/PHACO Right 05/08/2013   Procedure: CATARACT EXTRACTION PHACO AND INTRAOCULAR LENS PLACEMENT (IOC);  Surgeon: Tonny Branch, MD;  Location: [facility];  Service: Ophthalmology;  Laterality: Right;  CDE 10.31  . CATARACT EXTRACTION W/PHACO Left 08/17/2013   Procedure: CATARACT EXTRACTION PHACO AND INTRAOCULAR LENS PLACEMENT (IOC);  Surgeon: Tonny Branch, MD;  Location: [facility];  Service: Ophthalmology;  Laterality: Left;  CDE:9.03  . CHOLECYSTECTOMY    . COLONOSCOPY WITH PROPOFOL N/A 01/06/2016   Dr. Gala Romney: diverticulosis   . DENTAL SURGERY    . ESOPHAGOGASTRODUODENOSCOPY (EGD) WITH PROPOFOL N/A 01/06/2016   Dr. Gala Romney: normal s/p empiric dilation   . EYE SURGERY    . MALONEY DILATION N/A 01/06/2016   Procedure: Venia Minks DILATION;  Surgeon: Daneil Dolin, MD;  Location: [facility];  Service: Endoscopy;  Laterality: N/A;  .  REVERSE SHOULDER ARTHROPLASTY Right 08/06/2017   Procedure: RIGHT REVERSE SHOULDER ARTHROPLASTY;  Surgeon: Netta Cedars, MD;  Location: [facility];  Service: Orthopedics;  Laterality: Right;  . WRIST GANGLION EXCISION Left      OB History   None      Home Medications    Prior to Admission medications   Medication Sig Start Date End Date Taking? Authorizing Provider  acetaminophen (TYLENOL) 500 MG tablet Take 1,000 mg by mouth 3 (three) times daily.     [provider]  albuterol (PROVENTIL HFA;VENTOLIN HFA) 108  (90 BASE) MCG/ACT inhaler Inhale 2 puffs into the lungs every 6 (six) hours as needed for wheezing or shortness of breath.    [provider]  apixaban (ELIQUIS) 5 MG TABS tablet Take 10 mg by mouth BID through Friday 5/19 evening. Take 5mg  by mouth BID starting 5/20 AM. Patient taking differently: Take 5 mg by mouth 2 (two) times daily.  07/25/15   Lendon Colonel, NP  ARNUITY ELLIPTA 100 MCG/ACT AEPB Inhale 1 puff into the lungs daily.  06/13/17   [provider]  Ascorbic Acid (VITAMIN C) 1000 MG tablet Take 1,000 mg by mouth daily.     [provider]  benzonatate (TESSALON PERLES) 100 MG capsule Take 1-2 capsules by mouth daily as needed (cough).     [provider]  cetirizine (ZYRTEC) 10 MG tablet Take 10 mg by mouth daily.    [provider]  Cholecalciferol (VITAMIN D) 2000 units tablet Take 4,000 Units by mouth daily.     [provider]  CINNAMON PO Take 2-3 capsules by mouth 2 (two) times daily as needed (high blood sugar).     [provider]  clobetasol ointment (TEMOVATE) 1.09 % Apply 1 application topically 2 (two) times daily as needed (rash).  06/20/15   [provider]  Cod Liver Oil CAPS Take 1 capsule by mouth at bedtime.     [provider]  dexlansoprazole (DEXILANT) 60 MG capsule Take 1 capsule (60 mg total) by mouth daily. 03/02/17   Carlis Stable, NP  diclofenac sodium (VOLTAREN) 1 % GEL Apply 2 g topically 2 (two) times daily. 09/16/17   Kayleen Memos, DO  Doxepin HCl 5 % CREA apply 2 grams (2 grams=2 inches) to affected area(s) 3 times daily. Takes as needed 05/28/16   [provider]  Dulaglutide (TRULICITY) 1.5 NA/3.5TD SOPN Inject 1.5 mg into the skin once a week.    [provider]  DULoxetine (CYMBALTA) 30 MG capsule Take 1 capsule (30 mg total) by mouth 2 (two) times daily. 09/16/17   Kayleen Memos, DO  fluticasone (VERAMYST) 27.5 MCG/SPRAY nasal spray Place 2 sprays into the  nose daily.     [provider]  HYDROcodone-acetaminophen (NORCO) 5-325 MG tablet Take 1-2 tablets by mouth every 6 (six) hours as needed for moderate pain. 08/06/17   Netta Cedars, MD  hydrOXYzine (ATARAX/VISTARIL) 25 MG tablet Take 25 mg by mouth every 8 (eight) hours as needed for itching.  06/20/15   [provider]  levothyroxine (SYNTHROID, LEVOTHROID) 100 MCG tablet Take 100 mcg by mouth daily before breakfast.  11/23/13   [provider]  lubiprostone (AMITIZA) 8 MCG capsule Take 1 capsule (8 mcg total) by mouth 2 (two) times daily with a meal. 05/19/17   Mahala Menghini, PA-C  LYRICA 75 MG capsule Take 75 mg by mouth 2 (two) times daily.  07/18/15   [provider]  Melatonin 10 MG TABS Take 10 mg by mouth at bedtime.     [provider]  methocarbamol (ROBAXIN) 500 MG tablet Take 1 tablet (500 mg total) by mouth 3 (three) times daily as needed. 08/06/17   Netta Cedars, MD  metolazone (ZAROXOLYN) 5 MG tablet Take 1 tablet (5 mg total) by mouth 2 (two) times daily. 07/02/15   Samuella Cota, MD  metoprolol tartrate (LOPRESSOR) 25 MG tablet Take 1 tablet (25 mg total) by mouth 2 (two) times daily. 09/16/17   Kayleen Memos, DO  pravastatin (PRAVACHOL) 80 MG tablet Take 1 tablet (80 mg total) by mouth at bedtime. 09/16/17   Kayleen Memos, DO  PROAIR RESPICLICK 366 (90 Base) MCG/ACT AEPB Inhale 2 puffs into the lungs every 6 (six) hours.  05/20/17   [provider]  torsemide (DEMADEX) 20 MG tablet Take 2 tablets (40 mg total) by mouth daily. 07/02/15   Samuella Cota, MD  TRESIBA FLEXTOUCH 100 UNIT/ML SOPN FlexTouch Pen Inject 90 Units into the skin daily. 09/13/17   [provider]  vitamin B-12 (CYANOCOBALAMIN) 1000 MCG tablet Take 1,000 mcg by mouth daily.     [provider]  vitamin E 200 UNIT capsule Take 200 Units by mouth daily.     [provider]    Family History Family History  Problem Relation Age of  Onset  . Colon cancer Neg Hx     Social History Social History   Tobacco Use  . Smoking status: Former Smoker    Packs/day: 0.25    Years: 30.00    Pack years: 7.50    Types: Cigarettes    Last attempt to quit: 02/17/2014    Years since quitting: 3.6  . Smokeless tobacco: Never Used  . Tobacco comment: some day smoker  Substance Use Topics  . Alcohol use: No    Alcohol/week: 0.0 standard drinks  . Drug use: No     Allergies   Tetracyclines & related; Banana; and Penicillins   Review of Systems Review of Systems  Constitutional: Negative for activity change.       All ROS Neg except as noted in HPI  HENT: Negative for nosebleeds.   Eyes: Negative for photophobia and discharge.  Respiratory: Negative for cough, shortness of breath and wheezing.   Cardiovascular: Negative for chest pain and palpitations.  Gastrointestinal: Negative for abdominal pain and blood in stool.  Genitourinary: Negative for dysuria, frequency and hematuria.  Musculoskeletal: Negative for arthralgias, back pain and neck pain.  Skin: Negative.   Neurological: Negative for dizziness, seizures and speech difficulty.  Psychiatric/Behavioral: Negative for confusion and hallucinations.     Physical Exam Updated Vital Signs BP (!) 149/78   Pulse (!) 101   Temp 98.5 F (36.9 C)   Resp 18   Ht 5\' 7"  (1.702 m)   Wt 105.2 kg   SpO2 100%   BMI 36.34 kg/m   Physical Exam  Constitutional: She is oriented to person, place, and time. She appears well-developed and well-nourished.  Non-toxic appearance.  HENT:  Head: Normocephalic.    Right Ear: Tympanic membrane and external ear normal.  Left Ear: Tympanic membrane and external ear normal.  Mouth/Throat:    There is tender to tenderness from the temporal area to the angle of the jaw and a portion of the submental area.  The left face is tender, there is mild swelling noted.   Eyes: Pupils are equal, round, and reactive to  light. EOM and lids  are normal.  Neck: Normal range of motion. Neck supple. Carotid bruit is not present.  Cardiovascular: Regular rhythm, normal heart sounds, intact distal pulses and normal pulses. Tachycardia present.  Pulmonary/Chest: Breath sounds normal. No respiratory distress.  Abdominal: Soft. Bowel sounds are normal. There is no tenderness. There is no guarding.  Musculoskeletal: Normal range of motion.  Lymphadenopathy:       Head (right side): No submandibular adenopathy present.       Head (left side): No submandibular adenopathy present.    She has no cervical adenopathy.  Neurological: She is alert and oriented to person, place, and time. She has normal strength. No cranial nerve deficit or sensory deficit.  Patient is amatory, but walks with a cane.  Skin: Skin is warm and dry.  Psychiatric: She has a normal mood and affect. Her speech is normal.  Nursing note and vitals reviewed.    ED Treatments / Results  Labs (all labs ordered are listed, but only abnormal results are displayed) Labs Reviewed - No data to display  EKG None  Radiology No results found.  Procedures Procedures (including critical care time)  Medications Ordered in ED Medications  HYDROcodone-acetaminophen (NORCO/VICODIN) 5-325 MG per tablet 2 tablet (has no administration in time range)  ibuprofen (ADVIL,MOTRIN) tablet 400 mg (has no administration in time range)  ondansetron (ZOFRAN) tablet 4 mg (has no administration in time range)     Initial Impression / Assessment and Plan / ED Course  I have reviewed the triage vital signs and the nursing notes.  Pertinent labs & imaging results that were available during my care of the patient were reviewed by me and considered in my medical decision making (see chart for details).       Final Clinical Impressions(s) / ED Diagnoses MDM  Vital signs reviewed.  Pulse oximetry is 100% on room air.  Within normal limits by my interpretation.  Patient very  uncomfortable during the examination.  Oral hydrocodone ordered for the patient's pain.  Will obtain a CT maxillofacial bones study to evaluate for possible parotid stone, and/or other sources of inflammation.  The CT maxillofacial shows dental caries present.  The sinuses are clear, the soft tissues are negative, and the bones are within normal limits.  Patient will be treated with antibiotics.  We cannot use anti-inflammatory medications as the patient is on Eliquis.  Patient will be treated with Tylenol for pain, and ibuprofen for more severe pain.  I have asked the patient to follow-up with the dentist right concerning these pains.  There is no mention of parotid gland stones in the CT work-up.  Prescription for clindamycin and Norco given to the patient.   Final diagnoses:  Dental caries  Left facial pain    ED Discharge Orders         Ordered    clindamycin (CLEOCIN) 150 MG capsule     09/29/17 0145    HYDROcodone-acetaminophen (NORCO/VICODIN) 5-325 MG tablet  Every 4 hours PRN     09/29/17 0145           Lily Kocher, PA-C 09/29/17 0102    [name], MD 09/29/17 [phone]

## 2017-09-29 NOTE — Discharge Instructions (Addendum)
Your CT scan suggest that the pain and swelling of your face are probably related to more dental problems than parotid problems.  Please use clindamycin 2 times daily with food.  Please use 1000mg  of tylenol (2 tabs) with breakfast, lunch, dinner, and at bedtime.  May use Norco for more severe pain.  Norco may cause drowsiness.  Please use this medication with caution.  Please see Dr. Maudie Mercury for additional evaluation and for pain control if not improving.  It is important that you see a dentist as soon as possible.

## 2017-09-30 ENCOUNTER — Other Ambulatory Visit: Payer: Self-pay

## 2017-09-30 NOTE — Patient Outreach (Signed)
CMA outreached patient due to recent ED visit.  Patient unable to speak with CMA due to facial pain. Patients daughter states patient has a follow up appointment with both the dentist and PCP.  Daughter did not want to continue all, but asked if service information could be mailed.   CMA will mail unsuccessful letter, magnet, and know before you go today. CMA also recommended daughter to contact payor in regards to patient looking for new PCP.  [place] Management Assistant

## 2017-10-04 ENCOUNTER — Encounter (HOSPITAL_COMMUNITY): Payer: Self-pay | Admitting: Hematology

## 2017-10-04 ENCOUNTER — Other Ambulatory Visit: Payer: Self-pay

## 2017-10-04 ENCOUNTER — Inpatient Hospital Stay (HOSPITAL_BASED_OUTPATIENT_CLINIC_OR_DEPARTMENT_OTHER): Payer: Medicare Other | Admitting: Hematology

## 2017-10-04 VITALS — BP 116/64 | HR 87 | Temp 98.5°F | Resp 18 | Wt 227.6 lb

## 2017-10-04 DIAGNOSIS — Z86718 Personal history of other venous thrombosis and embolism: Secondary | ICD-10-CM

## 2017-10-04 DIAGNOSIS — D472 Monoclonal gammopathy: Secondary | ICD-10-CM

## 2017-10-04 DIAGNOSIS — Z862 Personal history of diseases of the blood and blood-forming organs and certain disorders involving the immune mechanism: Secondary | ICD-10-CM | POA: Diagnosis not present

## 2017-10-04 DIAGNOSIS — M797 Fibromyalgia: Secondary | ICD-10-CM | POA: Diagnosis not present

## 2017-10-04 DIAGNOSIS — Z7901 Long term (current) use of anticoagulants: Secondary | ICD-10-CM | POA: Diagnosis not present

## 2017-10-04 NOTE — Assessment & Plan Note (Signed)
1.IgG kappa MGUS: -Bone marrow aspiration on 09/03/2015 showing 9% plasma cells and negative skeletal survey.  Last M spike of 1.3 g/dL. - History of nephrotic range proteinuria, resolved now but as per patient - I discussed the latest lab results with her.  M spike went slightly up to 1.5 g/dL.  This was 1.3 at last visit. -Creatinine has improved to 1.4 from 1.6.  Hemoglobin is 12.1.  Free kappa light chains have increased to 662 from 437.  Free light chain ratio has also increased to 35.8 from 17.8. -I will see her back in 3 months with repeat blood work.  I also plan to do skeletal survey prior to next visit.  2.  Iron deficiency state: - Last ferritin was 58 in January.  She received 2 infusions of Feraheme starting on 06/25/2017 1 week apart.  Her ferritin today is 554 with percent saturation of 39.  Hemoglobin improved to 12.1.  We will repeat her iron panel in 3 months.  3.  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  She is on Eliquis indefinitely and tolerating it well.  Apparently her primary care doctor wants to discontinue anticoagulation.  4. fibromyalgia: -She is on tramadol, Cymbalta and Lyrica.  Takes Tylenol as needed.

## 2017-10-04 NOTE — Patient Instructions (Addendum)
[place] at [facility] Discharge Instructions   Follow up in 3 months with labs and bone survey  Thank you for choosing [place] at [facility] to provide your oncology and hematology care.  To afford each patient quality time with our provider, please arrive at least 15 minutes before your scheduled appointment time.   If you have a lab appointment with the [facility] please come in thru the  Main Entrance and check in at the main information desk  You need to re-schedule your appointment should you arrive 10 or more minutes late.  We strive to give you quality time with our providers, and arriving late affects you and other patients whose appointments are after yours.  Also, if you no show three or more times for appointments you may be dismissed from the clinic at the providers discretion.     Again, thank you for choosing [facility].  Our hope is that these requests will decrease the amount of time that you wait before being seen by our physicians.       _____________________________________________________________  Should you have questions after your visit to [facility], please contact our office at [phone] between the hours of 8:00 a.m. and 4:30 p.m.  Voicemails left after 4:00 p.m. will not be returned until the following business day.  For prescription refill requests, have your pharmacy contact our office and allow 72 hours.    Cancer Center Support Programs:   > Cancer Support Group  2nd Tuesday of the month 1pm-2pm, Journey Room

## 2017-10-04 NOTE — Progress Notes (Signed)
Stephanie [name], Stephanie [name] [id number]   CLINIC:  Medical Oncology/Hematology  PCP:  Stephanie [name], Stephanie [name] [id number] [phone]   REASON FOR VISIT: Follow-up for MGUS  CURRENT THERAPY: Observation   INTERVAL HISTORY:  [name] 67 y.o. female returns for routine follow-up for MGUS. Patient is here today and she is doing well. She has been in the hospital for an infection. She is recovering well with no new issues. Patient has been trying to remain active at home. Patients appetite is 50% and energy level is 50%. Patient denies any new pain. Denies any vomiting or diarrhea. Denies any fevers, chills or night sweats.     REVIEW OF SYSTEMS:  Review of Systems  Constitutional: Positive for fatigue.  Cardiovascular: Positive for leg swelling.  Skin: Positive for itching.  Neurological: Positive for numbness.  All other systems reviewed and are negative.    PAST MEDICAL/SURGICAL HISTORY:  Past Medical History:  Diagnosis Date  . Anemia   . Arthritis   . Asthma   . COPD (chronic obstructive pulmonary disease) ([place])   . Deep vein thrombosis (DVT) of both lower extremities ([place]) 06/27/2015  . Diabetes mellitus   . Fibromyalgia   . GERD (gastroesophageal reflux disease)   . H/O hiatal hernia   . Hypercholesteremia   . Hypertension   . Hyperthyroidism   . IBS (irritable bowel syndrome)   . Inner ear disease   . 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 PONV (postoperative nausea and vomiting)   . Tachycardia    Past Surgical History:  Procedure Laterality Date  . ABDOMINAL HYSTERECTOMY  partial  . CARPAL TUNNEL RELEASE Right 1991  . CATARACT EXTRACTION W/PHACO Right 05/08/2013   Procedure: CATARACT EXTRACTION PHACO AND INTRAOCULAR LENS PLACEMENT (IOC);  Surgeon: Tonny Branch, MD;  Location: [facility];  Service: Ophthalmology;  Laterality: Right;  CDE 10.31  . CATARACT EXTRACTION W/PHACO  Left 08/17/2013   Procedure: CATARACT EXTRACTION PHACO AND INTRAOCULAR LENS PLACEMENT (IOC);  Surgeon: Tonny Branch, MD;  Location: [facility];  Service: Ophthalmology;  Laterality: Left;  CDE:9.03  . CHOLECYSTECTOMY    . COLONOSCOPY WITH PROPOFOL N/A 01/06/2016   Dr. Gala Romney: diverticulosis   . DENTAL SURGERY    . ESOPHAGOGASTRODUODENOSCOPY (EGD) WITH PROPOFOL N/A 01/06/2016   Dr. Gala Romney: normal s/p empiric dilation   . EYE SURGERY    . MALONEY DILATION N/A 01/06/2016   Procedure: Venia Minks DILATION;  Surgeon: Daneil Dolin, MD;  Location: [facility];  Service: Endoscopy;  Laterality: N/A;  . REVERSE SHOULDER ARTHROPLASTY Right 08/06/2017   Procedure: RIGHT REVERSE SHOULDER ARTHROPLASTY;  Surgeon: Netta Cedars, MD;  Location: [facility];  Service: Orthopedics;  Laterality: Right;  . WRIST GANGLION EXCISION Left      SOCIAL HISTORY:  Social History   Socioeconomic History  . Marital status: Married    Spouse name: Not on file  . Number of children: Not on file  . Years of education: Not on file  . Highest education level: Not on file  Occupational History  . Not on file  Social Needs  . Financial resource strain: Not on file  . Food insecurity:    Worry: Not on file    Inability: Not on file  . Transportation needs:    Medical: Not on file    Non-medical: Not on file  Tobacco Use  . Smoking status: Former Smoker    Packs/day: 0.25  Years: 30.00    Pack years: 7.50    Types: Cigarettes    Last attempt to quit: 02/17/2014    Years since quitting: 3.6  . Smokeless tobacco: Never Used  . Tobacco comment: some day smoker  Substance and Sexual Activity  . Alcohol use: No    Alcohol/week: 0.0 standard drinks  . Drug use: No  . Sexual activity: Not Currently    Birth control/protection: Surgical  Lifestyle  . Physical activity:    Days per week: Not on file    Minutes per session: Not on file  . Stress: Not on file  Relationships  . Social connections:    Talks on phone: Not on  file    Gets together: Not on file    Attends religious service: Not on file    Active member of club or organization: Not on file    Attends meetings of clubs or organizations: Not on file    Relationship status: Not on file  . Intimate partner violence:    Fear of current or ex partner: Not on file    Emotionally abused: Not on file    Physically abused: Not on file    Forced sexual activity: Not on file  Other Topics Concern  . Not on file  Social History Narrative  . Not on file    FAMILY HISTORY:  Family History  Problem Relation Age of Onset  . Colon cancer Neg Hx     CURRENT MEDICATIONS:  Outpatient Encounter Medications as of 10/04/2017  Medication Sig  . acetaminophen (TYLENOL) 500 MG tablet Take 1,000 mg by mouth 3 (three) times daily.   Marland Kitchen albuterol (PROVENTIL HFA;VENTOLIN HFA) 108 (90 BASE) MCG/ACT inhaler Inhale 2 puffs into the lungs every 6 (six) hours as needed for wheezing or shortness of breath.  Marland Kitchen apixaban (ELIQUIS) 5 MG TABS tablet Take 10 mg by mouth BID through Friday 5/19 evening. Take 30m by mouth BID starting 5/20 AM. (Patient taking differently: Take 5 mg by mouth 2 (two) times daily. )  . ARNUITY ELLIPTA 100 MCG/ACT AEPB Inhale 1 puff into the lungs daily.   . Ascorbic Acid (VITAMIN C) 1000 MG tablet Take 1,000 mg by mouth daily.   . benzonatate (TESSALON PERLES) 100 MG capsule Take 1-2 capsules by mouth daily as needed (cough).   . cetirizine (ZYRTEC) 10 MG tablet Take 10 mg by mouth daily.  . Cholecalciferol (VITAMIN D) 2000 units tablet Take 4,000 Units by mouth daily.   .Marland KitchenCINNAMON PO Take 2-3 capsules by mouth 2 (two) times daily as needed (high blood sugar).   . clindamycin (CLEOCIN) 150 MG capsule 2 tabs po bid with food  . clobetasol ointment (TEMOVATE) 01.77% Apply 1 application topically 2 (two) times daily as needed (rash).   .Marland KitchenCod Liver Oil CAPS Take 1 capsule by mouth at bedtime.   .Marland Kitchendexlansoprazole (DEXILANT) 60 MG capsule Take 1 capsule (60  mg total) by mouth daily.  . diclofenac sodium (VOLTAREN) 1 % GEL Apply 2 g topically 2 (two) times daily.  . Doxepin HCl 5 % CREA apply 2 grams (2 grams=2 inches) to affected area(s) 3 times daily. Takes as needed  . Dulaglutide (TRULICITY) 1.5 MNH/6.5BXSOPN Inject 1.5 mg into the skin once a week.  . DULoxetine (CYMBALTA) 30 MG capsule Take 1 capsule (30 mg total) by mouth 2 (two) times daily.  . fluticasone (VERAMYST) 27.5 MCG/SPRAY nasal spray Place 2 sprays into the nose daily.   .Marland Kitchen  HUMALOG KWIKPEN 100 UNIT/ML KiwkPen INJECT 30 UNITS SUBCUTANEOUSLY THREE TIMES DAILY BEFORE MEALS FOR SUGAR  . HYDROcodone-acetaminophen (NORCO/VICODIN) 5-325 MG tablet Take 1 tablet by mouth every 4 (four) hours as needed.  . hydrOXYzine (ATARAX/VISTARIL) 25 MG tablet Take 25 mg by mouth every 8 (eight) hours as needed for itching.   . levothyroxine (SYNTHROID, LEVOTHROID) 100 MCG tablet Take 100 mcg by mouth daily before breakfast.   . lubiprostone (AMITIZA) 8 MCG capsule Take 1 capsule (8 mcg total) by mouth 2 (two) times daily with a meal.  . LYRICA 75 MG capsule Take 75 mg by mouth 2 (two) times daily.   . Melatonin 10 MG TABS Take 10 mg by mouth at bedtime.   . methocarbamol (ROBAXIN) 500 MG tablet Take 1 tablet (500 mg total) by mouth 3 (three) times daily as needed.  . metolazone (ZAROXOLYN) 5 MG tablet Take 1 tablet (5 mg total) by mouth 2 (two) times daily.  . metoprolol tartrate (LOPRESSOR) 25 MG tablet Take 1 tablet (25 mg total) by mouth 2 (two) times daily.  . pravastatin (PRAVACHOL) 80 MG tablet Take 1 tablet (80 mg total) by mouth at bedtime.  Marland Kitchen PROAIR RESPICLICK 364 (90 Base) MCG/ACT AEPB Inhale 2 puffs into the lungs every 6 (six) hours.   . torsemide (DEMADEX) 20 MG tablet Take 2 tablets (40 mg total) by mouth daily.  . TRESIBA FLEXTOUCH 100 UNIT/ML SOPN FlexTouch Pen Inject 90 Units into the skin daily.  . vitamin B-12 (CYANOCOBALAMIN) 1000 MCG tablet Take 1,000 mcg by mouth daily.   . vitamin  E 200 UNIT capsule Take 200 Units by mouth daily.    No facility-administered encounter medications on file as of 10/04/2017.     ALLERGIES:  Allergies  Allergen Reactions  . Tetracyclines & Related Anaphylaxis  . Banana Hives and Nausea And Vomiting  . Penicillins Rash and Other (See Comments)    Has patient had a PCN reaction causing immediate rash, facial/tongue/throat swelling, SOB or lightheadedness with hypotension: No Has patient had a PCN reaction causing severe rash involving mucus membranes or skin necrosis: No Has patient had a PCN reaction that required hospitalization No Has patient had a PCN reaction occurring within the last 10 years: No If all of the above answers are "NO", then may proceed with Cephalosporin use.      PHYSICAL EXAM:  ECOG Performance status: 1  Vitals:   10/04/17 1422  BP: 116/64  Pulse: 87  Resp: 18  Temp: 98.5 F (36.9 C)  SpO2: 100%   Filed Weights   10/04/17 1422  Weight: 227 lb 9.6 oz (103.2 kg)    Physical Exam  Constitutional: She is oriented to person, place, and time. She appears well-developed and well-nourished.  Cardiovascular: Normal rate, regular rhythm and normal heart sounds.  Pulmonary/Chest: Effort normal and breath sounds normal.  Neurological: She is alert and oriented to person, place, and time.  Skin: Skin is warm and dry.     LABORATORY DATA:  I have reviewed the labs as listed.  CBC    Component Value Date/Time   WBC 11.0 (H) 09/20/2017 1114   RBC 4.81 09/20/2017 1114   HGB 12.1 09/20/2017 1114   HCT 37.2 09/20/2017 1114   PLT 285 09/20/2017 1114   MCV 77.3 (L) 09/20/2017 1114   MCH 25.2 (L) 09/20/2017 1114   MCHC 32.5 09/20/2017 1114   RDW 14.9 09/20/2017 1114   LYMPHSABS 3.3 09/20/2017 1114   MONOABS 0.7 09/20/2017 1114  EOSABS 0.4 09/20/2017 1114   BASOSABS 0.0 09/20/2017 1114   CMP Latest Ref Rng & Units 09/20/2017 09/15/2017 08/07/2017  Glucose 70 - 99 mg/dL 316(H) 183(H) 378(H)  BUN 8 - 23  mg/dL 50(H) 23 47(H)  Creatinine 0.44 - 1.00 mg/dL 1.40(H) 1.68(H) 1.90(H)  Sodium 135 - 145 mmol/L 130(L) 136 136  Potassium 3.5 - 5.1 mmol/L 3.3(L) 4.0 3.7  Chloride 98 - 111 mmol/L 93(L) 101 98(L)  CO2 22 - 32 mmol/L 27 25 27   Calcium 8.9 - 10.3 mg/dL 8.7(L) 9.6 8.5(L)  Total Protein 6.5 - 8.1 g/dL 8.4(H) 8.8(H) -  Total Bilirubin 0.3 - 1.2 mg/dL 0.8 0.7 -  Alkaline Phos 38 - 126 U/L 453(H) 490(H) -  AST 15 - 41 U/L 36 66(H) -  ALT 0 - 44 U/L 59(H) 71(H) -       DIAGNOSTIC IMAGING:  I have reviewed images of her prior skeletal survey.    ASSESSMENT & PLAN:   MGUS (monoclonal gammopathy of unknown significance) 1.IgG kappa MGUS: -Bone marrow aspiration on 09/03/2015 showing 9% plasma cells and negative skeletal survey.  Last M spike of 1.3 g/dL. - History of nephrotic range proteinuria, resolved now but as per patient - I discussed the latest lab results with her.  M spike went slightly up to 1.5 g/dL.  This was 1.3 at last visit. -Creatinine has improved to 1.4 from 1.6.  Hemoglobin is 12.1.  Free kappa light chains have increased to 662 from 437.  Free light chain ratio has also increased to 35.8 from 17.8. -I will see her back in 3 months with repeat blood work.  I also plan to do skeletal survey prior to next visit.  2.  Iron deficiency state: - Last ferritin was 58 in January.  She received 2 infusions of Feraheme starting on 06/25/2017 1 week apart.  Her ferritin today is 554 with percent saturation of 39.  Hemoglobin improved to 12.1.  We will repeat her iron panel in 3 months.  3.  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  She is on Eliquis indefinitely and tolerating it well.  Apparently her primary care doctor wants to discontinue anticoagulation.  4. fibromyalgia: -She is on tramadol, Cymbalta and Lyrica.  Takes Tylenol as needed.        Orders placed this encounter:  Orders Placed This  Encounter  Procedures  . DG Bone Survey Met  . Protein electrophoresis, serum  . Kappa/lambda light chains  . Lactate dehydrogenase  . CBC with Differential/Platelet  . Comprehensive metabolic panel  . Ferritin  . Iron and TIBC  . Protein electrophoresis, serum  . Kappa/lambda light chains  . CBC with Differential/Platelet  . Comprehensive metabolic panel  . Ferritin  . Iron and TIBC  . Lactate dehydrogenase      Derek Jack, MD [place] [phone]

## 2017-10-06 ENCOUNTER — Other Ambulatory Visit: Payer: Self-pay | Admitting: Nurse Practitioner

## 2017-10-06 DIAGNOSIS — K219 Gastro-esophageal reflux disease without esophagitis: Secondary | ICD-10-CM

## 2017-10-25 DIAGNOSIS — M545 Low back pain: Secondary | ICD-10-CM | POA: Diagnosis not present

## 2017-10-25 DIAGNOSIS — Z794 Long term (current) use of insulin: Secondary | ICD-10-CM | POA: Diagnosis not present

## 2017-10-25 DIAGNOSIS — E1165 Type 2 diabetes mellitus with hyperglycemia: Secondary | ICD-10-CM | POA: Diagnosis not present

## 2017-10-25 DIAGNOSIS — M5441 Lumbago with sciatica, right side: Secondary | ICD-10-CM | POA: Diagnosis not present

## 2017-10-25 DIAGNOSIS — Z79891 Long term (current) use of opiate analgesic: Secondary | ICD-10-CM | POA: Diagnosis not present

## 2017-10-26 DIAGNOSIS — M19011 Primary osteoarthritis, right shoulder: Secondary | ICD-10-CM | POA: Diagnosis not present

## 2017-11-02 ENCOUNTER — Encounter (HOSPITAL_COMMUNITY): Payer: Self-pay | Admitting: Physical Therapy

## 2017-11-02 NOTE — Therapy (Signed)
[facility], [place], [address] Phone: [phone]   Fax:  [phone]  Patient Details  Name: Stephanie [name] MRN: [id number] Date of Birth: [birth year] Referring Provider:  No ref. provider found  Encounter Date: 11/02/2017  PHYSICAL THERAPY DISCHARGE SUMMARY  Visits from Start of Care: 4   Current functional level related to goals / functional outcomes:  Has not returned to PT- DC    Remaining deficits: Unable to assess    Education / Equipment: None  Plan: Patient agrees to discharge.  Patient goals were not met. Patient is being discharged due to not returning since the last visit.  ?????       Deniece Ree PT, DPT, CBIS  Supplemental Physical Therapist [facility]    Pager [phone] Acute Rehab Office [facility] [address] [place], [place], [address] Phone: [phone]   Fax:  [phone]

## 2017-11-15 DIAGNOSIS — E1165 Type 2 diabetes mellitus with hyperglycemia: Secondary | ICD-10-CM | POA: Diagnosis not present

## 2017-11-15 DIAGNOSIS — I10 Essential (primary) hypertension: Secondary | ICD-10-CM | POA: Diagnosis not present

## 2017-11-15 DIAGNOSIS — Z23 Encounter for immunization: Secondary | ICD-10-CM | POA: Diagnosis not present

## 2017-11-15 DIAGNOSIS — E785 Hyperlipidemia, unspecified: Secondary | ICD-10-CM | POA: Diagnosis not present

## 2017-11-15 DIAGNOSIS — Z794 Long term (current) use of insulin: Secondary | ICD-10-CM | POA: Diagnosis not present

## 2017-11-23 ENCOUNTER — Ambulatory Visit: Payer: Medicare Other | Admitting: Gastroenterology

## 2017-12-06 DIAGNOSIS — Z713 Dietary counseling and surveillance: Secondary | ICD-10-CM | POA: Diagnosis not present

## 2017-12-06 DIAGNOSIS — E1165 Type 2 diabetes mellitus with hyperglycemia: Secondary | ICD-10-CM | POA: Diagnosis not present

## 2017-12-06 DIAGNOSIS — Z794 Long term (current) use of insulin: Secondary | ICD-10-CM | POA: Diagnosis not present

## 2017-12-24 ENCOUNTER — Other Ambulatory Visit: Payer: Self-pay

## 2017-12-24 NOTE — Patient Outreach (Signed)
[facility]) Care Management  12/24/2017  Stephanie [name] [birth year] [id number]   Medication Adherence call to Stephanie [name] spoke with patient she is due on Lisinopril 5 mg she is no longer taking this medication doctor took her off after the stroke she had. Stephanie [name] is showing past due under [facility].   [facility] Management Direct Dial [phone]  Fax [phone] [name]

## 2017-12-27 ENCOUNTER — Inpatient Hospital Stay (HOSPITAL_COMMUNITY): Payer: Medicare Other | Attending: Hematology

## 2017-12-27 ENCOUNTER — Ambulatory Visit (HOSPITAL_COMMUNITY)
Admission: RE | Admit: 2017-12-27 | Discharge: 2017-12-27 | Disposition: A | Discharge status: Home / Self Care | Payer: Medicare Other | Source: Ambulatory Visit | Attending: Nurse Practitioner | Admitting: Nurse Practitioner

## 2017-12-27 DIAGNOSIS — E611 Iron deficiency: Secondary | ICD-10-CM | POA: Diagnosis not present

## 2017-12-27 DIAGNOSIS — M4316 Spondylolisthesis, lumbar region: Secondary | ICD-10-CM | POA: Insufficient documentation

## 2017-12-27 DIAGNOSIS — D472 Monoclonal gammopathy: Secondary | ICD-10-CM

## 2017-12-27 DIAGNOSIS — Z7901 Long term (current) use of anticoagulants: Secondary | ICD-10-CM | POA: Insufficient documentation

## 2017-12-27 DIAGNOSIS — K760 Fatty (change of) liver, not elsewhere classified: Secondary | ICD-10-CM | POA: Diagnosis not present

## 2017-12-27 DIAGNOSIS — Z86718 Personal history of other venous thrombosis and embolism: Secondary | ICD-10-CM | POA: Insufficient documentation

## 2017-12-27 DIAGNOSIS — Z96611 Presence of right artificial shoulder joint: Secondary | ICD-10-CM | POA: Insufficient documentation

## 2017-12-27 DIAGNOSIS — R748 Abnormal levels of other serum enzymes: Secondary | ICD-10-CM | POA: Diagnosis not present

## 2017-12-27 DIAGNOSIS — M797 Fibromyalgia: Secondary | ICD-10-CM | POA: Insufficient documentation

## 2017-12-27 LAB — COMPREHENSIVE METABOLIC PANEL
ALT: 66 U/L — ABNORMAL HIGH (ref 0–44)
AST: 78 U/L — AB (ref 15–41)
Albumin: 3.4 g/dL — ABNORMAL LOW (ref 3.5–5.0)
Alkaline Phosphatase: 405 U/L — ABNORMAL HIGH (ref 38–126)
Anion gap: 10 (ref 5–15)
BILIRUBIN TOTAL: 0.9 mg/dL (ref 0.3–1.2)
BUN: 21 mg/dL (ref 8–23)
CO2: 24 mmol/L (ref 22–32)
CREATININE: 1.62 mg/dL — AB (ref 0.44–1.00)
Calcium: 9.1 mg/dL (ref 8.9–10.3)
Chloride: 101 mmol/L (ref 98–111)
GFR calc Af Amer: 37 mL/min — ABNORMAL LOW (ref >60)
GFR, EST NON AFRICAN AMERICAN: 32 mL/min — AB (ref >60)
Glucose, Bld: 297 mg/dL — ABNORMAL HIGH (ref 70–99)
POTASSIUM: 3.3 mmol/L — AB (ref 3.5–5.1)
Sodium: 135 mmol/L (ref 135–145)
TOTAL PROTEIN: 8.8 g/dL — AB (ref 6.5–8.1)

## 2017-12-27 LAB — IRON AND TIBC
Iron: 90 ug/dL (ref 28–170)
Saturation Ratios: 33 % — ABNORMAL HIGH (ref 10.4–31.8)
TIBC: 270 ug/dL (ref 250–450)
UIBC: 180 ug/dL

## 2017-12-27 LAB — CBC WITH DIFFERENTIAL/PLATELET
Abs Immature Granulocytes: 0.06 10*3/uL (ref 0.00–0.07)
Basophils Absolute: 0 10*3/uL (ref 0.0–0.1)
Basophils Relative: 0 %
EOS ABS: 0.3 10*3/uL (ref 0.0–0.5)
EOS PCT: 2 %
HEMATOCRIT: 39 % (ref 36.0–46.0)
Hemoglobin: 11.9 g/dL — ABNORMAL LOW (ref 12.0–15.0)
IMMATURE GRANULOCYTES: 1 %
LYMPHS ABS: 4.2 10*3/uL — AB (ref 0.7–4.0)
LYMPHS PCT: 32 %
MCH: 23.6 pg — AB (ref 26.0–34.0)
MCHC: 30.5 g/dL (ref 30.0–36.0)
MCV: 77.2 fL — AB (ref 80.0–100.0)
MONO ABS: 0.9 10*3/uL (ref 0.1–1.0)
MONOS PCT: 7 %
NEUTROS ABS: 7.7 10*3/uL (ref 1.7–7.7)
NEUTROS PCT: 58 %
NRBC: 0 % (ref 0.0–0.2)
Platelets: 305 10*3/uL (ref 150–400)
RBC: 5.05 MIL/uL (ref 3.87–5.11)
RDW: 15 % (ref 11.5–15.5)
WBC: 13.1 10*3/uL — ABNORMAL HIGH (ref 4.0–10.5)

## 2017-12-27 LAB — FERRITIN: FERRITIN: 455 ng/mL — AB (ref 11–307)

## 2017-12-27 LAB — LACTATE DEHYDROGENASE: LDH: 178 U/L (ref 98–192)

## 2017-12-28 ENCOUNTER — Other Ambulatory Visit (HOSPITAL_COMMUNITY): Payer: Medicare Other

## 2017-12-28 LAB — PROTEIN ELECTROPHORESIS, SERUM
A/G Ratio: 0.7 (ref 0.7–1.7)
Albumin ELP: 3.4 g/dL (ref 2.9–4.4)
Alpha-1-Globulin: 0.3 g/dL (ref 0.0–0.4)
Alpha-2-Globulin: 1 g/dL (ref 0.4–1.0)
BETA GLOBULIN: 1.2 g/dL (ref 0.7–1.3)
GAMMA GLOBULIN: 2.3 g/dL — AB (ref 0.4–1.8)
Globulin, Total: 4.8 g/dL — ABNORMAL HIGH (ref 2.2–3.9)
M-Spike, %: 1.9 g/dL — ABNORMAL HIGH
Total Protein ELP: 8.2 g/dL (ref 6.0–8.5)

## 2017-12-28 LAB — KAPPA/LAMBDA LIGHT CHAINS
Kappa free light chain: 321.7 mg/L — ABNORMAL HIGH (ref 3.3–19.4)
Kappa, lambda light chain ratio: 15.54 — ABNORMAL HIGH (ref 0.26–1.65)
Lambda free light chains: 20.7 mg/L (ref 5.7–26.3)

## 2018-01-03 ENCOUNTER — Ambulatory Visit (HOSPITAL_COMMUNITY): Payer: Medicare Other | Admitting: Hematology

## 2018-01-04 ENCOUNTER — Ambulatory Visit (HOSPITAL_COMMUNITY): Payer: Medicare Other | Admitting: Hematology

## 2018-01-10 ENCOUNTER — Encounter (HOSPITAL_COMMUNITY): Payer: Self-pay | Admitting: Hematology

## 2018-01-10 ENCOUNTER — Inpatient Hospital Stay (HOSPITAL_COMMUNITY): Payer: Medicare Other | Attending: Hematology | Admitting: Hematology

## 2018-01-10 ENCOUNTER — Other Ambulatory Visit: Payer: Self-pay

## 2018-01-10 VITALS — BP 144/94 | HR 127 | Temp 98.4°F | Resp 18 | Wt 219.3 lb

## 2018-01-10 DIAGNOSIS — K760 Fatty (change of) liver, not elsewhere classified: Secondary | ICD-10-CM | POA: Diagnosis not present

## 2018-01-10 DIAGNOSIS — Z86718 Personal history of other venous thrombosis and embolism: Secondary | ICD-10-CM

## 2018-01-10 DIAGNOSIS — Z7901 Long term (current) use of anticoagulants: Secondary | ICD-10-CM | POA: Diagnosis not present

## 2018-01-10 DIAGNOSIS — E611 Iron deficiency: Secondary | ICD-10-CM | POA: Insufficient documentation

## 2018-01-10 DIAGNOSIS — D472 Monoclonal gammopathy: Secondary | ICD-10-CM

## 2018-01-10 DIAGNOSIS — M797 Fibromyalgia: Secondary | ICD-10-CM | POA: Insufficient documentation

## 2018-01-10 DIAGNOSIS — R748 Abnormal levels of other serum enzymes: Secondary | ICD-10-CM | POA: Insufficient documentation

## 2018-01-10 NOTE — Patient Instructions (Signed)
[facility] at [facility] Discharge Instructions  Follow up in 3 months with labs prior.    Thank you for choosing [facility] at [facility] to provide your oncology and hematology care.  To afford each patient quality time with our provider, please arrive at least 15 minutes before your scheduled appointment time.   If you have a lab appointment with the Cancer Center please come in thru the  Main Entrance and check in at the main information desk  You need to re-schedule your appointment should you arrive 10 or more minutes late.  We strive to give you quality time with our providers, and arriving late affects you and other patients whose appointments are after yours.  Also, if you no show three or more times for appointments you may be dismissed from the clinic at the providers discretion.     Again, thank you for choosing [facility].  Our hope is that these requests will decrease the amount of time that you wait before being seen by our physicians.       _____________________________________________________________  Should you have questions after your visit to [facility], please contact our office at [phone] between the hours of 8:00 a.m. and 4:30 p.m.  Voicemails left after 4:00 p.m. will not be returned until the following business day.  For prescription refill requests, have your pharmacy contact our office and allow 72 hours.    Cancer Center Support Programs:   > Cancer Support Group  2nd Tuesday of the month 1pm-2pm, Journey Room    

## 2018-01-10 NOTE — Assessment & Plan Note (Signed)
1.IgG kappa MGUS: -Bone marrow aspiration on 09/03/2015 showing 9% plasma cells and negative skeletal survey.  Last M spike of 1.3 g/dL. - History of nephrotic range proteinuria, resolved now but as per patient - Skeletal survey reviewed by me on 12/27/2017 did not show any lytic lesions. - We discussed the blood work.  M spike went up to 1.9.  It was previously 1.5.  Free light chain ratio has improved to 15.54, previously 35.  Kappa light chains were 321.  Creatinine is at her baseline.  Calcium was normal. - As the M spike has gone up slightly, I have recommended her a follow-up visit in 3 months with repeat labs.  2.  Iron deficiency state: - Last Feraheme infusion on 06/25/2017 and 07/16/2017. - Ferritin was 455 and percent saturation was 33.  She does not require any parenteral iron at this time.  3.  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  She is on Eliquis indefinitely and tolerating it well.  Apparently her primary care doctor wants to discontinue anticoagulation.  4. fibromyalgia: -She is on tramadol, Cymbalta and Lyrica.  Takes Tylenol as needed.  5.  Elevated liver enzymes: - This is likely from fatty liver.  Ultrasound in April 2017 shows fatty infiltration of the liver.

## 2018-01-10 NOTE — Progress Notes (Signed)
Stephanie [name], Stephanie [name]   CLINIC:  Medical Oncology/Hematology  PCP:  Jani Gravel, [place] [phone]   REASON FOR VISIT: Follow-up for MGUS  CURRENT THERAPY: Observation   INTERVAL HISTORY:  [name] 67 y.o. female returns for routine follow-up for MGUS. She is doing well at this time. She has intermittent constipation and diarrhea. She also has numbness and tingling in her hands however this is stable at this time. She denies any new pains. Denies any bleeding or easy bruising. Denies any fevers or recent infections . Denies any vomiting. He reports his appetite at 25%. Her energy level is 75%. She remains active at home and performs all her own ADLs.     REVIEW OF SYSTEMS:  Review of Systems  Gastrointestinal: Positive for constipation and diarrhea.  Neurological: Positive for numbness.  Psychiatric/Behavioral: Positive for sleep disturbance.  All other systems reviewed and are negative.    PAST MEDICAL/SURGICAL HISTORY:  Past Medical History:  Diagnosis Date  . Anemia   . Arthritis   . Asthma   . COPD (chronic obstructive pulmonary disease) ([place])   . Deep vein thrombosis (DVT) of both lower extremities ([place]) 06/27/2015  . Diabetes mellitus   . Fibromyalgia   . GERD (gastroesophageal reflux disease)   . H/O hiatal hernia   . Hypercholesteremia   . Hypertension   . Hyperthyroidism   . IBS (irritable bowel syndrome)   . Inner ear disease   . 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 PONV (postoperative nausea and vomiting)   . Tachycardia    Past Surgical History:  Procedure Laterality Date  . ABDOMINAL HYSTERECTOMY  partial  . CARPAL TUNNEL RELEASE Right 1991  . CATARACT EXTRACTION W/PHACO Right 05/08/2013   Procedure: CATARACT EXTRACTION PHACO AND INTRAOCULAR LENS PLACEMENT (IOC);  Surgeon: Tonny Branch, MD;  Location: [facility];  Service:  Ophthalmology;  Laterality: Right;  CDE 10.31  . CATARACT EXTRACTION W/PHACO Left 08/17/2013   Procedure: CATARACT EXTRACTION PHACO AND INTRAOCULAR LENS PLACEMENT (IOC);  Surgeon: Tonny Branch, MD;  Location: [facility];  Service: Ophthalmology;  Laterality: Left;  CDE:9.03  . CHOLECYSTECTOMY    . COLONOSCOPY WITH PROPOFOL N/A 01/06/2016   Dr. Gala Romney: diverticulosis   . DENTAL SURGERY    . ESOPHAGOGASTRODUODENOSCOPY (EGD) WITH PROPOFOL N/A 01/06/2016   Dr. Gala Romney: normal s/p empiric dilation   . EYE SURGERY    . MALONEY DILATION N/A 01/06/2016   Procedure: Venia Minks DILATION;  Surgeon: Daneil Dolin, MD;  Location: [facility];  Service: Endoscopy;  Laterality: N/A;  . REVERSE SHOULDER ARTHROPLASTY Right 08/06/2017   Procedure: RIGHT REVERSE SHOULDER ARTHROPLASTY;  Surgeon: Netta Cedars, MD;  Location: [facility];  Service: Orthopedics;  Laterality: Right;  . WRIST GANGLION EXCISION Left      SOCIAL HISTORY:  Social History   Socioeconomic History  . Marital status: Married    Spouse name: Not on file  . Number of children: Not on file  . Years of education: Not on file  . Highest education level: Not on file  Occupational History  . Not on file  Social Needs  . Financial resource strain: Not on file  . Food insecurity:    Worry: Not on file    Inability: Not on file  . Transportation needs:    Medical: Not on file    Non-medical: Not on file  Tobacco Use  . Smoking  status: Former Smoker    Packs/day: 0.25    Years: 30.00    Pack years: 7.50    Types: Cigarettes    Last attempt to quit: 02/17/2014    Years since quitting: 3.8  . Smokeless tobacco: Never Used  . Tobacco comment: some day smoker  Substance and Sexual Activity  . Alcohol use: No    Alcohol/week: 0.0 standard drinks  . Drug use: No  . Sexual activity: Not Currently    Birth control/protection: Surgical  Lifestyle  . Physical activity:    Days per week: Not on file    Minutes per session: Not on file  . Stress:  Not on file  Relationships  . Social connections:    Talks on phone: Not on file    Gets together: Not on file    Attends religious service: Not on file    Active member of club or organization: Not on file    Attends meetings of clubs or organizations: Not on file    Relationship status: Not on file  . Intimate partner violence:    Fear of current or ex partner: Not on file    Emotionally abused: Not on file    Physically abused: Not on file    Forced sexual activity: Not on file  Other Topics Concern  . Not on file  Social History Narrative  . Not on file    FAMILY HISTORY:  Family History  Problem Relation Age of Onset  . Colon cancer Neg Hx     CURRENT MEDICATIONS:  Outpatient Encounter Medications as of 01/10/2018  Medication Sig  . acetaminophen (TYLENOL) 500 MG tablet Take 1,000 mg by mouth 3 (three) times daily.   Marland Kitchen albuterol (PROVENTIL HFA;VENTOLIN HFA) 108 (90 BASE) MCG/ACT inhaler Inhale 2 puffs into the lungs every 6 (six) hours as needed for wheezing or shortness of breath.  Marland Kitchen apixaban (ELIQUIS) 5 MG TABS tablet Take 10 mg by mouth BID through Friday 5/19 evening. Take 5mg  by mouth BID starting 5/20 AM. (Patient taking differently: Take 5 mg by mouth 2 (two) times daily. )  . ARNUITY ELLIPTA 100 MCG/ACT AEPB Inhale 1 puff into the lungs daily.   . Ascorbic Acid (VITAMIN C) 1000 MG tablet Take 1,000 mg by mouth daily.   . benzonatate (TESSALON PERLES) 100 MG capsule Take 1-2 capsules by mouth daily as needed (cough).   . cetirizine (ZYRTEC) 10 MG tablet Take 10 mg by mouth daily.  . Cholecalciferol (VITAMIN D) 2000 units tablet Take 4,000 Units by mouth daily.   Marland Kitchen CINNAMON PO Take 2-3 capsules by mouth 2 (two) times daily as needed (high blood sugar).   . clobetasol ointment (TEMOVATE) 7.67 % Apply 1 application topically 2 (two) times daily as needed (rash).   Marland Kitchen Cod Liver Oil CAPS Take 1 capsule by mouth at bedtime.   Marland Kitchen DEXILANT 60 MG capsule TAKE 1 CAPSULE BY  MOUTH ONCE DAILY  . diclofenac sodium (VOLTAREN) 1 % GEL Apply 2 g topically 2 (two) times daily.  . Doxepin HCl 5 % CREA apply 2 grams (2 grams=2 inches) to affected area(s) 3 times daily. Takes as needed  . DULoxetine (CYMBALTA) 30 MG capsule Take 1 capsule (30 mg total) by mouth 2 (two) times daily.  . fluticasone (VERAMYST) 27.5 MCG/SPRAY nasal spray Place 2 sprays into the nose daily.   Marland Kitchen HUMALOG KWIKPEN 100 UNIT/ML KiwkPen INJECT 30 UNITS SUBCUTANEOUSLY THREE TIMES DAILY BEFORE MEALS FOR SUGAR  . hydrOXYzine (ATARAX/VISTARIL)  25 MG tablet Take 25 mg by mouth every 8 (eight) hours as needed for itching.   . levothyroxine (SYNTHROID, LEVOTHROID) 100 MCG tablet Take 100 mcg by mouth daily before breakfast.   . lubiprostone (AMITIZA) 8 MCG capsule Take 1 capsule (8 mcg total) by mouth 2 (two) times daily with a meal.  . LYRICA 75 MG capsule Take 75 mg by mouth 2 (two) times daily.   . Melatonin 10 MG TABS Take 10 mg by mouth at bedtime.   . methocarbamol (ROBAXIN) 500 MG tablet Take 1 tablet (500 mg total) by mouth 3 (three) times daily as needed.  . metolazone (ZAROXOLYN) 5 MG tablet Take 1 tablet (5 mg total) by mouth 2 (two) times daily.  . metoprolol tartrate (LOPRESSOR) 25 MG tablet Take 1 tablet (25 mg total) by mouth 2 (two) times daily.  . pravastatin (PRAVACHOL) 80 MG tablet Take 1 tablet (80 mg total) by mouth at bedtime.  Marland Kitchen PROAIR RESPICLICK 563 (90 Base) MCG/ACT AEPB Inhale 2 puffs into the lungs every 6 (six) hours.   . torsemide (DEMADEX) 20 MG tablet Take 2 tablets (40 mg total) by mouth daily.  . traMADol (ULTRAM) 50 MG tablet TK 1 T PO TID FOR PAIN  . vitamin B-12 (CYANOCOBALAMIN) 1000 MCG tablet Take 1,000 mcg by mouth daily.   . vitamin E 200 UNIT capsule Take 200 Units by mouth daily.   . XULTOPHY 100-3.6 UNIT-MG/ML SOPN INJECT 44 UNITS INTO THE SKIN ONCE A DAY FOR DIABETES  . [DISCONTINUED] HYDROcodone-acetaminophen (NORCO/VICODIN) 5-325 MG tablet Take 1 tablet by mouth  every 4 (four) hours as needed.  . [DISCONTINUED] clindamycin (CLEOCIN) 150 MG capsule 2 tabs po bid with food  . [DISCONTINUED] Dulaglutide (TRULICITY) 1.5 SL/3.7DS SOPN Inject 1.5 mg into the skin once a week.  . [DISCONTINUED] TRESIBA FLEXTOUCH 100 UNIT/ML SOPN FlexTouch Pen Inject 90 Units into the skin daily.   No facility-administered encounter medications on file as of 01/10/2018.     ALLERGIES:  Allergies  Allergen Reactions  . Tetracyclines & Related Anaphylaxis  . Banana Hives and Nausea And Vomiting  . Penicillins Rash and Other (See Comments)    Has patient had a PCN reaction causing immediate rash, facial/tongue/throat swelling, SOB or lightheadedness with hypotension: No Has patient had a PCN reaction causing severe rash involving mucus membranes or skin necrosis: No Has patient had a PCN reaction that required hospitalization No Has patient had a PCN reaction occurring within the last 10 years: No If all of the above answers are "NO", then may proceed with Cephalosporin use.      PHYSICAL EXAM:  ECOG Performance status: 1  Vitals:   01/10/18 1032  BP: (!) 144/94  Pulse: (!) 127  Resp: 18  Temp: 98.4 F (36.9 C)  SpO2: 100%   Filed Weights   01/10/18 1032  Weight: 219 lb 4.8 oz (99.5 kg)    Physical Exam  Constitutional: She is oriented to person, place, and time. She appears well-developed and well-nourished.  Musculoskeletal: Normal range of motion.  Neurological: She is alert and oriented to person, place, and time.  Skin: Skin is warm and dry.  Psychiatric: She has a normal mood and affect. Her behavior is normal. Judgment and thought content normal.     LABORATORY DATA:  I have reviewed the labs as listed.  CBC    Component Value Date/Time   WBC 13.1 (H) 12/27/2017 1306   RBC 5.05 12/27/2017 1306   HGB 11.9 (L)  12/27/2017 1306   HCT 39.0 12/27/2017 1306   PLT 305 12/27/2017 1306   MCV 77.2 (L) 12/27/2017 1306   MCH 23.6 (L) 12/27/2017  1306   MCHC 30.5 12/27/2017 1306   RDW 15.0 12/27/2017 1306   LYMPHSABS 4.2 (H) 12/27/2017 1306   MONOABS 0.9 12/27/2017 1306   EOSABS 0.3 12/27/2017 1306   BASOSABS 0.0 12/27/2017 1306   CMP Latest Ref Rng & Units 12/27/2017 09/20/2017 09/15/2017  Glucose 70 - 99 mg/dL 297(H) 316(H) 183(H)  BUN 8 - 23 mg/dL 21 50(H) 23  Creatinine 0.44 - 1.00 mg/dL 1.62(H) 1.40(H) 1.68(H)  Sodium 135 - 145 mmol/L 135 130(L) 136  Potassium 3.5 - 5.1 mmol/L 3.3(L) 3.3(L) 4.0  Chloride 98 - 111 mmol/L 101 93(L) 101  CO2 22 - 32 mmol/L 24 27 25   Calcium 8.9 - 10.3 mg/dL 9.1 8.7(L) 9.6  Total Protein 6.5 - 8.1 g/dL 8.8(H) 8.4(H) 8.8(H)  Total Bilirubin 0.3 - 1.2 mg/dL 0.9 0.8 0.7  Alkaline Phos 38 - 126 U/L 405(H) 453(H) 490(H)  AST 15 - 41 U/L 78(H) 36 66(H)  ALT 0 - 44 U/L 66(H) 59(H) 71(H)       DIAGNOSTIC IMAGING:  I have reviewed her ultrasound from April 2017 and discussed with her.     ASSESSMENT & PLAN:   MGUS (monoclonal gammopathy of unknown significance) 1.IgG kappa MGUS: -Bone marrow aspiration on 09/03/2015 showing 9% plasma cells and negative skeletal survey.  Last M spike of 1.3 g/dL. - History of nephrotic range proteinuria, resolved now but as per patient - Skeletal survey reviewed by me on 12/27/2017 did not show any lytic lesions. - We discussed the blood work.  M spike went up to 1.9.  It was previously 1.5.  Free light chain ratio has improved to 15.54, previously 35.  Kappa light chains were 321.  Creatinine is at her baseline.  Calcium was normal. - As the M spike has gone up slightly, I have recommended her a follow-up visit in 3 months with repeat labs.  2.  Iron deficiency state: - Last Feraheme infusion on 06/25/2017 and 07/16/2017. - Ferritin was 455 and percent saturation was 33.  She does not require any parenteral iron at this time.  3.  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  She is on  Eliquis indefinitely and tolerating it well.  Apparently her primary care doctor wants to discontinue anticoagulation.  4. fibromyalgia: -She is on tramadol, Cymbalta and Lyrica.  Takes Tylenol as needed.  5.  Elevated liver enzymes: - This is likely from fatty liver.  Ultrasound in April 2017 shows fatty infiltration of the liver.        Orders placed this encounter:  Orders Placed This Encounter  Procedures  . Lactate dehydrogenase  . Protein electrophoresis, serum  . Kappa/lambda light chains  . CBC with Differential/Platelet  . Comprehensive metabolic panel  . Ferritin  . Iron and TIBC      Derek Jack, MD [place] [phone]

## 2018-01-21 ENCOUNTER — Other Ambulatory Visit (HOSPITAL_COMMUNITY)
Admission: RE | Admit: 2018-01-21 | Discharge: 2018-01-21 | Disposition: A | Discharge status: Home / Self Care | Payer: Medicare Other | Source: Ambulatory Visit | Attending: Internal Medicine | Admitting: Internal Medicine

## 2018-01-21 DIAGNOSIS — E1165 Type 2 diabetes mellitus with hyperglycemia: Secondary | ICD-10-CM | POA: Insufficient documentation

## 2018-01-21 LAB — BASIC METABOLIC PANEL
Anion gap: 10 (ref 5–15)
BUN: 20 mg/dL (ref 8–23)
CO2: 26 mmol/L (ref 22–32)
Calcium: 9.1 mg/dL (ref 8.9–10.3)
Chloride: 101 mmol/L (ref 98–111)
Creatinine, Ser: 0.99 mg/dL (ref 0.44–1.00)
GFR calc Af Amer: >60 mL/min (ref >60)
GFR calc non Af Amer: 59 mL/min — ABNORMAL LOW (ref >60)
Glucose, Bld: 197 mg/dL — ABNORMAL HIGH (ref 70–99)
Potassium: 3.1 mmol/L — ABNORMAL LOW (ref 3.5–5.1)
SODIUM: 137 mmol/L (ref 135–145)

## 2018-01-21 LAB — LIPID PANEL
Cholesterol: 201 mg/dL — ABNORMAL HIGH (ref 0–200)
HDL: 50 mg/dL (ref >40)
LDL Cholesterol: 119 mg/dL — ABNORMAL HIGH (ref 0–99)
Total CHOL/HDL Ratio: 4 RATIO
Triglycerides: 162 mg/dL — ABNORMAL HIGH (ref <150)
VLDL: 32 mg/dL (ref 0–40)

## 2018-01-21 LAB — HEMOGLOBIN A1C
Hgb A1c MFr Bld: 8.6 % — ABNORMAL HIGH (ref 4.8–5.6)
Mean Plasma Glucose: 200.12 mg/dL

## 2018-01-21 LAB — TSH: TSH: 1.28 u[IU]/mL (ref 0.350–4.500)

## 2018-01-24 DIAGNOSIS — E039 Hypothyroidism, unspecified: Secondary | ICD-10-CM | POA: Diagnosis not present

## 2018-01-24 DIAGNOSIS — M545 Low back pain: Secondary | ICD-10-CM | POA: Diagnosis not present

## 2018-01-24 DIAGNOSIS — Z79899 Other long term (current) drug therapy: Secondary | ICD-10-CM | POA: Diagnosis not present

## 2018-01-24 DIAGNOSIS — G894 Chronic pain syndrome: Secondary | ICD-10-CM | POA: Diagnosis not present

## 2018-01-24 DIAGNOSIS — E1140 Type 2 diabetes mellitus with diabetic neuropathy, unspecified: Secondary | ICD-10-CM | POA: Diagnosis not present

## 2018-01-24 DIAGNOSIS — E785 Hyperlipidemia, unspecified: Secondary | ICD-10-CM | POA: Diagnosis not present

## 2018-01-24 DIAGNOSIS — G4700 Insomnia, unspecified: Secondary | ICD-10-CM | POA: Diagnosis not present

## 2018-02-01 ENCOUNTER — Other Ambulatory Visit: Payer: Self-pay

## 2018-02-01 NOTE — Patient Outreach (Signed)
[facility]) Care Management  02/01/2018  Stephanie [name] 1950-03-05 [id number]   Medication Adherence call to Stephanie [name] patient did not answer but spoke with patient last month she is no longer taking Lisinopril 2.5 mg patient had a stroke  ,doctor took her off all her medications at this time. Stephanie [name] is showing past due under [facility].   [facility] Direct Dial [phone]  Fax [phone] [email or web address]

## 2018-02-28 ENCOUNTER — Encounter: Payer: Self-pay | Admitting: Gastroenterology

## 2018-02-28 ENCOUNTER — Ambulatory Visit (INDEPENDENT_AMBULATORY_CARE_PROVIDER_SITE_OTHER): Payer: Medicare Other | Admitting: Gastroenterology

## 2018-02-28 VITALS — BP 143/83 | HR 115 | Temp 98.4°F | Ht 68.0 in | Wt 221.6 lb

## 2018-02-28 DIAGNOSIS — K219 Gastro-esophageal reflux disease without esophagitis: Secondary | ICD-10-CM | POA: Diagnosis not present

## 2018-02-28 DIAGNOSIS — R945 Abnormal results of liver function studies: Secondary | ICD-10-CM | POA: Insufficient documentation

## 2018-02-28 DIAGNOSIS — R7989 Other specified abnormal findings of blood chemistry: Secondary | ICD-10-CM | POA: Insufficient documentation

## 2018-02-28 DIAGNOSIS — K5900 Constipation, unspecified: Secondary | ICD-10-CM | POA: Diagnosis not present

## 2018-02-28 MED ORDER — CIMETIDINE 200 MG PO TABS: 100.0000 mg | ORAL_TABLET | Freq: Every day | ORAL | 2 refills | Status: DC

## 2018-02-28 NOTE — Assessment & Plan Note (Signed)
Repeat labs in February as planned.  Consider updating ultrasound if increase in AST/ALT/alk phos.  Also check GGT, AP isoenzymes determine if alkaline phosphatase elevated due to liver source.

## 2018-02-28 NOTE — Assessment & Plan Note (Signed)
Continue Amitiza 8 mcg twice daily.

## 2018-02-28 NOTE — Assessment & Plan Note (Signed)
Overall doing okay except for nocturnal symptoms 3 to 4 days a week.  We will add low-dose Tagamet 100 mg at bedtime (reduced dose for renal insufficiency).  This was explained to the patient as well.  She can use as a preventative measure before bedtime or on nights needed.

## 2018-02-28 NOTE — Progress Notes (Signed)
Primary Care Physician: Jani Gravel, MD  Primary Gastroenterologist:  Garfield Cornea, MD   Chief Complaint  Patient presents with  . Gastroesophageal Reflux    "little"    HPI: Stephanie [name] is a 68 y.o. female here for GERD/constipation.  She has a history of elevated LFTs as well.  EGD and colonoscopy in 2017 for heme positive stool showed diverticulosis, normal EGD with empiric dilation of the esophagus.Thorough work-up of elevated alk phos (initially isolated elevated alk phos but now AST/ALT elevated as well)  in setting of fatty liver with AMA, ANA negative. ASMA moderately positive. Negative Hep B, C, A. Followed by oncology with history of nephrotic syndrome, IgG kappa monoclonal gammopathy, observed for now.Liver biopsy with mild chronic hepatitis, mild fatty liver, non significant fibrosis, non-specific findings and likely secondary to drug-induced etiology.She has a history of nonspecific esophageal motility disorder on barium pill esophagram January 2019.  Overall she is feel ok except for fatigue. Reflux does fairly well during the day but at night she wakes up burning about 3-4 days per week. She takes baking soda when needed. Amitiza working well for constipation, taking 1mg BID. No melena, brbpr.    Goes back to see hematology/oncology in 03/2018. States they are rechecking labs due to change in LFTs and due to increased M spike.   LFTs 12/27/17: Total bilirubin 0.9, alkaline phosphatase 405, AST 76, ALT 66, albumin 3.4 in July 2019 her alkaline phosphatase was 498, AST 66, ALT 71.  December 2018 her AST and ALT were normal, alkaline phosphatase 261.  Current Outpatient Medications  Medication Sig Dispense Refill  . acetaminophen (TYLENOL) 500 MG tablet Take 1,000 mg by mouth as needed.     .Marland Kitchenalbuterol (PROVENTIL HFA;VENTOLIN HFA) 108 (90 BASE) MCG/ACT inhaler Inhale 2 puffs into the lungs every 6 (six) hours as needed for wheezing or shortness of breath.    .Marland Kitchenapixaban  (ELIQUIS) 5 MG TABS tablet Take 10 mg by mouth BID through Friday 5/19 evening. Take 543mby mouth BID starting 5/20 AM. (Patient taking differently: Take 5 mg by mouth 2 (two) times daily. ) 60 tablet 3  . ARNUITY ELLIPTA 100 MCG/ACT AEPB Inhale 1 puff into the lungs daily.   11  . Ascorbic Acid (VITAMIN C) 1000 MG tablet Take 1,000 mg by mouth daily.     . benzonatate (TESSALON PERLES) 100 MG capsule Take 1-2 capsules by mouth daily as needed (cough).     . cetirizine (ZYRTEC) 10 MG tablet Take 10 mg by mouth daily.    . Cholecalciferol (VITAMIN D) 2000 units tablet Take 4,000 Units by mouth daily.     . Marland KitchenINNAMON PO Take 2-3 capsules by mouth 2 (two) times daily as needed (high blood sugar).     . clobetasol ointment (TEMOVATE) 0.6.44 Apply 1 application topically 2 (two) times daily as needed (rash).     . Marland Kitchenod Liver Oil CAPS Take 1 capsule by mouth at bedtime.     . Marland KitchenEXILANT 60 MG capsule TAKE 1 CAPSULE BY MOUTH ONCE DAILY 30 capsule 5  . diclofenac sodium (VOLTAREN) 1 % GEL Apply 2 g topically 2 (two) times daily. 1 Tube 0  . Doxepin HCl 5 % CREA apply 2 grams (2 grams=2 inches) to affected area(s) 3 times daily. Takes as needed  5  . DULoxetine (CYMBALTA) 30 MG capsule Take 1 capsule (30 mg total) by mouth 2 (two) times daily. 30 capsule -0  . fluticasone (  VERAMYST) 27.5 MCG/SPRAY nasal spray Place 2 sprays into the nose daily.     Marland Kitchen HUMALOG KWIKPEN 100 UNIT/ML KiwkPen INJECT 30 UNITS SUBCUTANEOUSLY THREE TIMES DAILY BEFORE MEALS FOR SUGAR  5  . hydrOXYzine (ATARAX/VISTARIL) 25 MG tablet Take 25 mg by mouth every 8 (eight) hours as needed for itching.     . levothyroxine (SYNTHROID, LEVOTHROID) 100 MCG tablet Take 100 mcg by mouth daily before breakfast.   0  . lubiprostone (AMITIZA) 8 MCG capsule Take 1 capsule (8 mcg total) by mouth 2 (two) times daily with a meal. 60 capsule 11  . LYRICA 75 MG capsule Take 75 mg by mouth 2 (two) times daily.     . methocarbamol (ROBAXIN) 500 MG tablet Take  1 tablet (500 mg total) by mouth 3 (three) times daily as needed. 60 tablet 1  . metolazone (ZAROXOLYN) 5 MG tablet Take 1 tablet (5 mg total) by mouth 2 (two) times daily. 60 tablet 0  . metoprolol tartrate (LOPRESSOR) 25 MG tablet Take 1 tablet (25 mg total) by mouth 2 (two) times daily. 60 tablet 0  . POTASSIUM CHLORIDE PO Take by mouth daily. Unsure of strength    . pravastatin (PRAVACHOL) 80 MG tablet Take 1 tablet (80 mg total) by mouth at bedtime. 30 tablet 0  . PROAIR RESPICLICK 443 (90 Base) MCG/ACT AEPB Inhale 2 puffs into the lungs as needed.     . torsemide (DEMADEX) 20 MG tablet Take 2 tablets (40 mg total) by mouth daily. 60 tablet 0  . traMADol (ULTRAM) 50 MG tablet TK 1 T PO TID FOR PAIN  1  . traZODone (DESYREL) 50 MG tablet Take 50 mg by mouth at bedtime.    . vitamin B-12 (CYANOCOBALAMIN) 1000 MCG tablet Take 1,000 mcg by mouth daily.     . vitamin E 200 UNIT capsule Take 200 Units by mouth daily.     . XULTOPHY 100-3.6 UNIT-MG/ML SOPN 48 Units daily.   0   No current facility-administered medications for this visit.     Allergies as of 02/28/2018 - Review Complete 02/28/2018  Allergen Reaction Noted  . Tetracyclines & related Anaphylaxis 09/19/2010  . Banana Hives and Nausea And Vomiting 05/04/2013  . Penicillins Rash and Other (See Comments) 09/19/2010    ROS:  General: Negative for anorexia, weight loss, fever, chills, fatigue, weakness. ENT: Negative for hoarseness, difficulty swallowing , nasal congestion. CV: Negative for chest pain, angina, palpitations, dyspnea on exertion, peripheral edema.  Respiratory: Negative for dyspnea at rest, dyspnea on exertion, cough, sputum, wheezing.  GI: See history of present illness. GU:  Negative for dysuria, hematuria, urinary incontinence, urinary frequency, nocturnal urination.  Endo: Negative for unusual weight change.    Physical Examination:   BP (!) 143/83   Pulse (!) 115   Temp 98.4 F (36.9 C) (Oral)   Ht 5'  8" (1.727 m)   Wt 221 lb 9.6 oz (100.5 kg)   BMI 33.69 kg/m   General: Well-nourished, well-developed in no acute distress.  Eyes: No icterus. Mouth: Oropharyngeal mucosa moist and pink , no lesions erythema or exudate. Lungs: Clear to auscultation bilaterally.  Heart: Regular rate and rhythm, no murmurs rubs or gallops.  Abdomen: Bowel sounds are normal, nontender, nondistended, no hepatosplenomegaly or masses, no abdominal bruits or hernia , no rebound or guarding.   Extremities: No lower extremity edema. No clubbing or deformities. Neuro: Alert and oriented x 4   Skin: Warm and dry, no jaundice.  Psych: Alert and cooperative, normal mood and affect.  Labs:  Lab Results  Component Value Date   TSH 1.280 01/21/2018   Lab Results  Component Value Date   CREATININE 0.99 01/21/2018   BUN 20 01/21/2018   NA 137 01/21/2018   K 3.1 (L) 01/21/2018   CL 101 01/21/2018   CO2 26 01/21/2018   Lab Results  Component Value Date   ALT 66 (H) 12/27/2017   AST 78 (H) 12/27/2017   GGT 141 (H) 859923   ALKPHOS 405 (H) 12/27/2017   BILITOT 0.9 12/27/2017   Lab Results  Component Value Date   HGBA1C 8.6 (H) 01/21/2018   Lab Results  Component Value Date   IRON 90 12/27/2017   TIBC 270 12/27/2017   FERRITIN 455 (H) 12/27/2017     Imaging Studies: No results found.

## 2018-02-28 NOTE — Progress Notes (Signed)
CC'D TO PCP °

## 2018-02-28 NOTE — Patient Instructions (Signed)
1. Please have your labs done in 03/2018 when you go for Dr. Edgardo Roys labs.  2. You can add Tagamet 1/2 tablet at bedtime to help with nighttime symptoms. Because of you kidney function, you should take only 1/2 tablet at a time. No more than one whole tablet per day.  3. If you have not heard from [place] within 5 business days of having your labs drawn, please call our office to make sure I got the labs sent to me.

## 2018-04-04 ENCOUNTER — Inpatient Hospital Stay (HOSPITAL_COMMUNITY): Payer: Medicare Other | Attending: Hematology

## 2018-04-04 ENCOUNTER — Other Ambulatory Visit (HOSPITAL_COMMUNITY)
Admission: RE | Admit: 2018-04-04 | Discharge: 2018-04-04 | Disposition: A | Discharge status: Home / Self Care | Payer: Medicare Other | Source: Ambulatory Visit | Attending: Gastroenterology | Admitting: Gastroenterology

## 2018-04-04 DIAGNOSIS — R748 Abnormal levels of other serum enzymes: Secondary | ICD-10-CM | POA: Insufficient documentation

## 2018-04-04 DIAGNOSIS — K5900 Constipation, unspecified: Secondary | ICD-10-CM | POA: Insufficient documentation

## 2018-04-04 DIAGNOSIS — R945 Abnormal results of liver function studies: Secondary | ICD-10-CM | POA: Diagnosis not present

## 2018-04-04 DIAGNOSIS — D472 Monoclonal gammopathy: Secondary | ICD-10-CM | POA: Insufficient documentation

## 2018-04-04 DIAGNOSIS — E611 Iron deficiency: Secondary | ICD-10-CM | POA: Diagnosis not present

## 2018-04-04 DIAGNOSIS — M797 Fibromyalgia: Secondary | ICD-10-CM | POA: Insufficient documentation

## 2018-04-04 DIAGNOSIS — K760 Fatty (change of) liver, not elsewhere classified: Secondary | ICD-10-CM | POA: Diagnosis not present

## 2018-04-04 DIAGNOSIS — Z7901 Long term (current) use of anticoagulants: Secondary | ICD-10-CM | POA: Diagnosis not present

## 2018-04-04 DIAGNOSIS — K219 Gastro-esophageal reflux disease without esophagitis: Secondary | ICD-10-CM | POA: Insufficient documentation

## 2018-04-04 DIAGNOSIS — Z86718 Personal history of other venous thrombosis and embolism: Secondary | ICD-10-CM | POA: Insufficient documentation

## 2018-04-04 LAB — CBC WITH DIFFERENTIAL/PLATELET
Abs Immature Granulocytes: 0.04 10*3/uL (ref 0.00–0.07)
BASOS ABS: 0 10*3/uL (ref 0.0–0.1)
Basophils Relative: 0 %
Eosinophils Absolute: 0.3 10*3/uL (ref 0.0–0.5)
Eosinophils Relative: 3 %
HCT: 39.8 % (ref 36.0–46.0)
Hemoglobin: 12.3 g/dL (ref 12.0–15.0)
Immature Granulocytes: 0 %
Lymphocytes Relative: 30 %
Lymphs Abs: 3.2 10*3/uL (ref 0.7–4.0)
MCH: 25.2 pg — ABNORMAL LOW (ref 26.0–34.0)
MCHC: 30.9 g/dL (ref 30.0–36.0)
MCV: 81.4 fL (ref 80.0–100.0)
Monocytes Absolute: 0.7 10*3/uL (ref 0.1–1.0)
Monocytes Relative: 6 %
NRBC: 0.2 % (ref 0.0–0.2)
Neutro Abs: 6.6 10*3/uL (ref 1.7–7.7)
Neutrophils Relative %: 61 %
Platelets: 246 10*3/uL (ref 150–400)
RBC: 4.89 MIL/uL (ref 3.87–5.11)
RDW: 13.6 % (ref 11.5–15.5)
WBC: 10.9 10*3/uL — ABNORMAL HIGH (ref 4.0–10.5)

## 2018-04-04 LAB — IRON AND TIBC
Iron: 92 ug/dL (ref 28–170)
Saturation Ratios: 35 % — ABNORMAL HIGH (ref 10.4–31.8)
TIBC: 263 ug/dL (ref 250–450)
UIBC: 171 ug/dL

## 2018-04-04 LAB — COMPREHENSIVE METABOLIC PANEL
ALT: 79 U/L — ABNORMAL HIGH (ref 0–44)
AST: 63 U/L — ABNORMAL HIGH (ref 15–41)
Albumin: 3.4 g/dL — ABNORMAL LOW (ref 3.5–5.0)
Alkaline Phosphatase: 384 U/L — ABNORMAL HIGH (ref 38–126)
Anion gap: 10 (ref 5–15)
BUN: 23 mg/dL (ref 8–23)
CO2: 24 mmol/L (ref 22–32)
Calcium: 8.9 mg/dL (ref 8.9–10.3)
Chloride: 98 mmol/L (ref 98–111)
Creatinine, Ser: 1.35 mg/dL — ABNORMAL HIGH (ref 0.44–1.00)
GFR calc Af Amer: 47 mL/min — ABNORMAL LOW (ref >60)
GFR calc non Af Amer: 41 mL/min — ABNORMAL LOW (ref >60)
Glucose, Bld: 431 mg/dL — ABNORMAL HIGH (ref 70–99)
Potassium: 4 mmol/L (ref 3.5–5.1)
Sodium: 132 mmol/L — ABNORMAL LOW (ref 135–145)
Total Bilirubin: 0.7 mg/dL (ref 0.3–1.2)
Total Protein: 8.5 g/dL — ABNORMAL HIGH (ref 6.5–8.1)

## 2018-04-04 LAB — FERRITIN: Ferritin: 362 ng/mL — ABNORMAL HIGH (ref 11–307)

## 2018-04-04 LAB — LACTATE DEHYDROGENASE: LDH: 152 U/L (ref 98–192)

## 2018-04-04 LAB — GAMMA GT: GGT: 872 U/L — AB (ref 7–50)

## 2018-04-05 LAB — MITOCHONDRIAL ANTIBODIES: Mitochondrial M2 Ab, IgG: <20 U (ref 0.0–20.0)

## 2018-04-05 LAB — KAPPA/LAMBDA LIGHT CHAINS
Kappa free light chain: 876 mg/L — ABNORMAL HIGH (ref 3.3–19.4)
Kappa, lambda light chain ratio: 52.14 — ABNORMAL HIGH (ref 0.26–1.65)
Lambda free light chains: 16.8 mg/L (ref 5.7–26.3)

## 2018-04-05 LAB — ANTI-SMOOTH MUSCLE ANTIBODY, IGG: F-Actin IgG: 30 U — ABNORMAL HIGH (ref 0–19)

## 2018-04-05 LAB — ANA: Anti Nuclear Antibody(ANA): POSITIVE — AB

## 2018-04-06 LAB — PROTEIN ELECTROPHORESIS, SERUM
A/G Ratio: 0.7 (ref 0.7–1.7)
Albumin ELP: 3.2 g/dL (ref 2.9–4.4)
Alpha-1-Globulin: 0.3 g/dL (ref 0.0–0.4)
Alpha-2-Globulin: 1 g/dL (ref 0.4–1.0)
BETA GLOBULIN: 1.2 g/dL (ref 0.7–1.3)
Gamma Globulin: 2.3 g/dL — ABNORMAL HIGH (ref 0.4–1.8)
Globulin, Total: 4.7 g/dL — ABNORMAL HIGH (ref 2.2–3.9)
M-Spike, %: 1.8 g/dL — ABNORMAL HIGH
Total Protein ELP: 7.9 g/dL (ref 6.0–8.5)

## 2018-04-06 LAB — ALKALINE PHOSPHATASE, ISOENZYMES
Alk Phos Bone Fract: 18 % (ref 14–68)
Alk Phos Liver Fract: 71 % (ref 18–85)
Alk Phos: 486 IU/L — ABNORMAL HIGH (ref 39–117)
INTESTINAL %: 11 % (ref 0–18)

## 2018-04-08 ENCOUNTER — Telehealth: Payer: Self-pay | Admitting: Gastroenterology

## 2018-04-08 NOTE — Telephone Encounter (Signed)
Opened In error

## 2018-04-11 ENCOUNTER — Ambulatory Visit (HOSPITAL_COMMUNITY): Payer: Medicare Other | Admitting: Internal Medicine

## 2018-04-12 ENCOUNTER — Telehealth: Payer: Self-pay | Admitting: *Deleted

## 2018-04-12 ENCOUNTER — Other Ambulatory Visit: Payer: Self-pay | Admitting: *Deleted

## 2018-04-12 ENCOUNTER — Ambulatory Visit (HOSPITAL_COMMUNITY): Payer: Medicare Other | Admitting: Hematology

## 2018-04-12 DIAGNOSIS — R7989 Other specified abnormal findings of blood chemistry: Secondary | ICD-10-CM

## 2018-04-12 DIAGNOSIS — R945 Abnormal results of liver function studies: Secondary | ICD-10-CM

## 2018-04-12 NOTE — Telephone Encounter (Signed)
Checked [employer] website for PA for u/s and received message "NOTE: This [facility] member does not require prior authorization for OUTPATIENT Radiology through [place] or [place] [facility] at this time."

## 2018-04-18 ENCOUNTER — Encounter (HOSPITAL_COMMUNITY): Payer: Self-pay | Admitting: Internal Medicine

## 2018-04-18 ENCOUNTER — Other Ambulatory Visit: Payer: Self-pay

## 2018-04-18 ENCOUNTER — Inpatient Hospital Stay (HOSPITAL_COMMUNITY): Payer: Medicare Other | Attending: Hematology | Admitting: Internal Medicine

## 2018-04-18 ENCOUNTER — Inpatient Hospital Stay (HOSPITAL_COMMUNITY): Payer: Medicare Other

## 2018-04-18 VITALS — BP 123/71 | HR 115 | Temp 98.1°F | Resp 18 | Wt 221.1 lb

## 2018-04-18 DIAGNOSIS — R899 Unspecified abnormal finding in specimens from other organs, systems and tissues: Secondary | ICD-10-CM

## 2018-04-18 DIAGNOSIS — R1310 Dysphagia, unspecified: Secondary | ICD-10-CM | POA: Insufficient documentation

## 2018-04-18 DIAGNOSIS — M797 Fibromyalgia: Secondary | ICD-10-CM | POA: Insufficient documentation

## 2018-04-18 DIAGNOSIS — Z791 Long term (current) use of non-steroidal anti-inflammatories (NSAID): Secondary | ICD-10-CM | POA: Insufficient documentation

## 2018-04-18 DIAGNOSIS — D472 Monoclonal gammopathy: Secondary | ICD-10-CM | POA: Diagnosis not present

## 2018-04-18 DIAGNOSIS — Z79899 Other long term (current) drug therapy: Secondary | ICD-10-CM | POA: Diagnosis not present

## 2018-04-18 DIAGNOSIS — E611 Iron deficiency: Secondary | ICD-10-CM | POA: Diagnosis not present

## 2018-04-18 DIAGNOSIS — Z87891 Personal history of nicotine dependence: Secondary | ICD-10-CM

## 2018-04-18 DIAGNOSIS — R945 Abnormal results of liver function studies: Secondary | ICD-10-CM

## 2018-04-18 DIAGNOSIS — Z7901 Long term (current) use of anticoagulants: Secondary | ICD-10-CM | POA: Diagnosis not present

## 2018-04-18 DIAGNOSIS — R7989 Other specified abnormal findings of blood chemistry: Secondary | ICD-10-CM | POA: Diagnosis not present

## 2018-04-18 DIAGNOSIS — I10 Essential (primary) hypertension: Secondary | ICD-10-CM | POA: Insufficient documentation

## 2018-04-18 DIAGNOSIS — Z86718 Personal history of other venous thrombosis and embolism: Secondary | ICD-10-CM

## 2018-04-18 DIAGNOSIS — E119 Type 2 diabetes mellitus without complications: Secondary | ICD-10-CM | POA: Diagnosis not present

## 2018-04-18 NOTE — Progress Notes (Signed)
Diagnosis MGUS (monoclonal gammopathy of unknown significance)  Elevated LFTs - Plan: Hemochromatosis DNA-PCR(c282y,h63d), CBC with Differential, Comprehensive metabolic panel, Lactate dehydrogenase, Ferritin  Abnormal laboratory test - Plan: Hemochromatosis DNA-PCR(c282y,h63d), CBC with Differential, Comprehensive metabolic panel, Lactate dehydrogenase, Ferritin  Staging Cancer Staging No matching staging information was found for the patient.  Assessment and Plan:  1.IgG kappa MGUS.  Pt is followed by Dr. Delton Coombes.   -Bone marrow aspiration on 09/03/2015 showing 9% plasma cells and negative skeletal survey.  Last M spike of 1.3 g/dL. - History of nephrotic range proteinuria.   - Skeletal survey done11/12/2017 negative for lytic lesions.  Labs done 04/04/2018 reviewed and showed WBC 10.9 HB 12.3 plts 246,000.  Chemistries WNL with K+ 4 Cr 1.35 and elevated LFTs.  SPEP M-spike stable at 1.8 g/dl.  FLC ratio has increased at 52.14.  Previously 15.54.  Cr 1.35 Calcium 8.9.  Will repeat labs in 06/2018 for ongoing close monitoring.    2.  Elevated LFTs.  Pt had USN done 05/2015 that showed fatty liver.  PT has AST of 63 and ALT of 79.  ALK Phos is elevated at 384.  Fractionation likely from liver not bone.  GGT elevated at 872.  Pt is scheduled for repeat imaging and GI follow-up.  Follow-up results of evaluation.  Will check hemochromatosis gene evaluation.    3.  Iron deficiency.  Pt was last treated with Feraheme 06/25/2017 and 07/16/2017.  Ferritin remains increased at 362.  TS 35.  Pt denies oral iron supplementation.  Will check hemochromatosis gene evaluation due to elevated LFTs.    4.  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  She is on Eliquis indefinitely and tolerating it well.    5. fibromyalgia: -She is on tramadol, Cymbalta and Lyrica.  Takes Tylenol as needed.  6.  Trouble swallowing.  Pt is referred to GI  for evaluation.    25 minutes spent with more than 50% spent in review of records, counseling and coordination of care.    Current Status:  Pt is seen today for follow-up.  She is here to go over labs.  She reports she is scheduled for imaging due to elevated LFTs.  She reports trouble swallowing.    Problem List Patient Active Problem List   Diagnosis Date Noted  . Abnormal LFTs [R94.5] 02/28/2018  . TIA (transient ischemic attack) [G45.9] 09/15/2017  . 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 Dysphagia [R13.10] 12/16/2015  . MGUS (monoclonal gammopathy of unknown significance) [D47.2] 12/13/2015  . Constipation [K59.00] 10/27/2015  . Diarrhea [R19.7] 08/02/2015  . Elevated alkaline phosphatase level [R74.8] 07/23/2015  . Heme positive stool [R19.5] 07/23/2015  . Hypoglycemia [E16.2] 07/01/2015  . Deep vein thrombosis (DVT) of both lower extremities ([place]) [I82.403] 06/27/2015  . Nephrotic range proteinuria [R80.9] 06/27/2015  . Maculopapular rash, generalized [R21] 06/27/2015  . Alopecia [L65.9] 06/27/2015  . Hypothyroidism [E03.9] 06/27/2015  . Diverticulitis [K57.92] 10/19/2013  . Tobacco abuse [Z72.0] 10/19/2013  . Obesity [E66.9] 10/19/2013  . Diabetes ([place]) [E11.9] 10/19/2013  . Hypertension [I10]   . Anemia [D64.9]   . GERD (gastroesophageal reflux disease) [K21.9]   . COPD (chronic obstructive pulmonary disease) ([place]) [J44.9]     Past Medical History Past Medical History:  Diagnosis Date  . Anemia   . Arthritis   . Asthma   . COPD (chronic obstructive pulmonary disease) ([place])   . Deep  vein thrombosis (DVT) of both lower extremities ([place]) 06/27/2015  . Diabetes mellitus   . Fibromyalgia   . GERD (gastroesophageal reflux disease)   . H/O hiatal hernia   . Hypercholesteremia   . Hypertension   . Hyperthyroidism   . IBS (irritable bowel syndrome)   . Inner ear disease   . 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   . PONV (postoperative nausea and vomiting)   . Tachycardia     Past Surgical History Past Surgical History:  Procedure Laterality Date  . ABDOMINAL HYSTERECTOMY  partial  . CARPAL TUNNEL RELEASE Right 1991  . CATARACT EXTRACTION W/PHACO Right 05/08/2013   Procedure: CATARACT EXTRACTION PHACO AND INTRAOCULAR LENS PLACEMENT (IOC);  Surgeon: Tonny Branch, MD;  Location: [facility];  Service: Ophthalmology;  Laterality: Right;  CDE 10.31  . CATARACT EXTRACTION W/PHACO Left 08/17/2013   Procedure: CATARACT EXTRACTION PHACO AND INTRAOCULAR LENS PLACEMENT (IOC);  Surgeon: Tonny Branch, MD;  Location: [facility];  Service: Ophthalmology;  Laterality: Left;  CDE:9.03  . CHOLECYSTECTOMY  1971  . COLONOSCOPY WITH PROPOFOL N/A 01/06/2016   Dr. Gala Romney: diverticulosis   . DENTAL SURGERY    . ESOPHAGOGASTRODUODENOSCOPY (EGD) WITH PROPOFOL N/A 01/06/2016   Dr. Gala Romney: normal s/p empiric dilation   . EYE SURGERY    . MALONEY DILATION N/A 01/06/2016   Procedure: Venia Minks DILATION;  Surgeon: Daneil Dolin, MD;  Location: [facility];  Service: Endoscopy;  Laterality: N/A;  . REVERSE SHOULDER ARTHROPLASTY Right 08/06/2017   Procedure: RIGHT REVERSE SHOULDER ARTHROPLASTY;  Surgeon: Netta Cedars, MD;  Location: [facility];  Service: Orthopedics;  Laterality: Right;  . WRIST GANGLION EXCISION Left     Family History Family History  Problem Relation Age of Onset  . Colon cancer Neg Hx      Social History  reports that she quit smoking about 4 years ago. Her smoking use included cigarettes. She has a 7.50 pack-year smoking history. She has never used smokeless tobacco. She reports that she does not drink alcohol or use drugs.  Medications  Current Outpatient Medications:  .  ACCU-CHEK AVIVA PLUS test strip, USE TO CHECK BLOOD SUGAR TID, Disp: , Rfl: 11 .  acetaminophen (TYLENOL) 500 MG tablet, Take 1,000 mg by mouth as needed. , Disp: , Rfl:  .  albuterol (PROVENTIL HFA;VENTOLIN HFA) 108 (90 BASE) MCG/ACT inhaler,  Inhale 2 puffs into the lungs every 6 (six) hours as needed for wheezing or shortness of breath., Disp: , Rfl:  .  apixaban (ELIQUIS) 5 MG TABS tablet, Take 10 mg by mouth BID through Friday 5/19 evening. Take 1m by mouth BID starting 5/20 AM. (Patient taking differently: Take 5 mg by mouth 2 (two) times daily. ), Disp: 60 tablet, Rfl: 3 .  ARNUITY ELLIPTA 100 MCG/ACT AEPB, Inhale 1 puff into the lungs daily. , Disp: , Rfl: 11 .  Ascorbic Acid (VITAMIN C) 1000 MG tablet, Take 1,000 mg by mouth daily. , Disp: , Rfl:  .  benzonatate (TESSALON PERLES) 100 MG capsule, Take 1-2 capsules by mouth daily as needed (cough). , Disp: , Rfl:  .  cetirizine (ZYRTEC) 10 MG tablet, Take 10 mg by mouth daily., Disp: , Rfl:  .  Cholecalciferol (VITAMIN D) 2000 units tablet, Take 4,000 Units by mouth daily. , Disp: , Rfl:  .  cimetidine (TAGAMET) 200 MG tablet, Take 0.5 tablets (100 mg total) by mouth at bedtime., Disp: 30 tablet, Rfl: 2 .  CINNAMON PO, Take 2-3 capsules by mouth  2 (two) times daily as needed (high blood sugar). , Disp: , Rfl:  .  clobetasol ointment (TEMOVATE) 9.74 %, Apply 1 application topically 2 (two) times daily as needed (rash). , Disp: , Rfl:  .  Cod Liver Oil CAPS, Take 1 capsule by mouth at bedtime. , Disp: , Rfl:  .  DEXILANT 60 MG capsule, TAKE 1 CAPSULE BY MOUTH ONCE DAILY, Disp: 30 capsule, Rfl: 5 .  diclofenac sodium (VOLTAREN) 1 % GEL, Apply 2 g topically 2 (two) times daily., Disp: 1 Tube, Rfl: 0 .  Doxepin HCl 5 % CREA, apply 2 grams (2 grams=2 inches) to affected area(s) 3 times daily. Takes as needed, Disp: , Rfl: 5 .  DULoxetine (CYMBALTA) 30 MG capsule, Take 1 capsule (30 mg total) by mouth 2 (two) times daily., Disp: 30 capsule, Rfl: -0 .  fluticasone (VERAMYST) 27.5 MCG/SPRAY nasal spray, Place 2 sprays into the nose daily. , Disp: , Rfl:  .  HUMALOG KWIKPEN 100 UNIT/ML KiwkPen, INJECT 30 UNITS SUBCUTANEOUSLY THREE TIMES DAILY BEFORE MEALS FOR SUGAR, Disp: , Rfl: 5 .   hydrOXYzine (ATARAX/VISTARIL) 25 MG tablet, Take 25 mg by mouth every 8 (eight) hours as needed for itching. , Disp: , Rfl:  .  levothyroxine (SYNTHROID, LEVOTHROID) 100 MCG tablet, Take 100 mcg by mouth daily before breakfast. , Disp: , Rfl: 0 .  lubiprostone (AMITIZA) 8 MCG capsule, Take 1 capsule (8 mcg total) by mouth 2 (two) times daily with a meal., Disp: 60 capsule, Rfl: 11 .  LYRICA 75 MG capsule, Take 75 mg by mouth 2 (two) times daily. , Disp: , Rfl:  .  methocarbamol (ROBAXIN) 500 MG tablet, Take 1 tablet (500 mg total) by mouth 3 (three) times daily as needed., Disp: 60 tablet, Rfl: 1 .  metolazone (ZAROXOLYN) 5 MG tablet, Take 1 tablet (5 mg total) by mouth 2 (two) times daily., Disp: 60 tablet, Rfl: 0 .  metoprolol tartrate (LOPRESSOR) 25 MG tablet, Take 1 tablet (25 mg total) by mouth 2 (two) times daily., Disp: 60 tablet, Rfl: 0 .  POTASSIUM CHLORIDE PO, Take by mouth daily. Unsure of strength, Disp: , Rfl:  .  pravastatin (PRAVACHOL) 80 MG tablet, Take 1 tablet (80 mg total) by mouth at bedtime., Disp: 30 tablet, Rfl: 0 .  PROAIR RESPICLICK 163 (90 Base) MCG/ACT AEPB, Inhale 2 puffs into the lungs as needed. , Disp: , Rfl:  .  torsemide (DEMADEX) 20 MG tablet, Take 2 tablets (40 mg total) by mouth daily., Disp: 60 tablet, Rfl: 0 .  traMADol (ULTRAM) 50 MG tablet, TK 1 T PO TID FOR PAIN, Disp: , Rfl: 1 .  traZODone (DESYREL) 50 MG tablet, Take 50 mg by mouth at bedtime., Disp: , Rfl:  .  vitamin B-12 (CYANOCOBALAMIN) 1000 MCG tablet, Take 1,000 mcg by mouth daily. , Disp: , Rfl:  .  vitamin E 200 UNIT capsule, Take 200 Units by mouth daily. , Disp: , Rfl:  .  XULTOPHY 100-3.6 UNIT-MG/ML SOPN, 48 Units daily. , Disp: , Rfl: 0  Allergies Tetracyclines & related; Banana; and Penicillins  Review of Systems Review of Systems - Oncology ROS negative other than trouble swallowing.     Physical Exam  Vitals Wt Readings from Last 3 Encounters:  04/18/18 221 lb 1.6 oz (100.3 kg)   02/28/18 221 lb 9.6 oz (100.5 kg)  01/10/18 219 lb 4.8 oz (99.5 kg)   Temp Readings from Last 3 Encounters:  04/18/18 98.1 F (36.7 C) (Oral)  02/28/18 98.4 F (36.9 C) (Oral)  01/10/18 98.4 F (36.9 C) (Oral)   BP Readings from Last 3 Encounters:  04/18/18 123/71  02/28/18 (!) 143/83  01/10/18 (!) 144/94   Pulse Readings from Last 3 Encounters:  04/18/18 (!) 115  02/28/18 (!) 115  01/10/18 (!) 127   Constitutional: Well-developed, well-nourished, and in no distress.   HENT: Head: Normocephalic and atraumatic.  Mouth/Throat: No oropharyngeal exudate. Mucosa moist. Eyes: Pupils are equal, round, and reactive to light. Conjunctivae are normal. No scleral icterus.  Neck: Normal range of motion. Neck supple. No JVD present.  Cardiovascular: Normal rate, regular rhythm and normal heart sounds.  Exam reveals no gallop and no friction rub.   No murmur heard. Pulmonary/Chest: Effort normal and breath sounds normal. No respiratory distress. No wheezes.No rales.  Abdominal: Soft. Bowel sounds are normal. No distension. There is no tenderness. There is no guarding.  Musculoskeletal: No edema or tenderness.  Lymphadenopathy: No cervical, axillary or supraclavicular adenopathy.  Neurological: Alert and oriented to person, place, and time. No cranial nerve deficit.  Skin: Skin is warm and dry. No rash noted. No erythema. No pallor.  Psychiatric: Affect and judgment normal.   Labs No visits with results within 3 Day(s) from this visit.  Latest known visit with results is:  Hospital Outpatient Visit on 04/04/2018  Component Date Value Ref Range Status  . Anti Nuclear Antibody(ANA) 04/04/2018 Positive* Negative Final   Comment: (NOTE) Performed At: [facility] [place], [place] [id number] Rush Farmer MD [id number]   . GGT 04/04/2018 872* 7 - 50 U/L Final   Performed at [facility], [place] [address], [place], [place] [address]  . Mitochondrial M2 Ab,  IgG 04/04/2018 <20.0  0.0 - 20.0 Units Final   Comment: (NOTE)                                Negative    0.0 - 20.0                                Equivocal  20.1 - 24.9                                Positive         >24.9 Mitochondrial (M2) Antibodies are found in 90-96% of patients with primary biliary cirrhosis. Performed At: [facility] [place], [place] [id number] Rush Farmer MD [id number]   . F-Actin IgG 04/04/2018 30* 0 - 19 Units Final   Comment: (NOTE)                 Negative                     0 - 19                 Weak positive               20 - 30                 Moderate to strong positive     >30 Actin Antibodies are found in 52-85% of patients with autoimmune hepatitis or chronic active hepatitis and in 22% of patients with primary biliary cirrhosis. Performed At: [facility] [address]  [place], [place] [id number] Rush Farmer MD [id number]   . Alk Phos 04/04/2018 486* 39 - 117 IU/L Final  . Alk Phos Bone Fract 04/04/2018 18  14 - 68 % Final  . Alk Phos Liver Fract 04/04/2018 71  18 - 85 % Final  . Intestinal % 04/04/2018 11  0 - 18 % Final   Comment: (NOTE) Performed At: [facility] [place], [place] [id number] Rush Farmer MD [id number]      Pathology Orders Placed This Encounter  Procedures  . Hemochromatosis DNA-PCR(c282y,h63d)  . CBC with Differential    Standing Status:   Future    Standing Expiration Date:   04/18/2019  . Comprehensive metabolic panel    Standing Status:   Future    Standing Expiration Date:   04/18/2019  . Lactate dehydrogenase    Standing Status:   Future    Standing Expiration Date:   04/18/2019  . Ferritin    Standing Status:   Future    Standing Expiration Date:   04/18/2019       Zoila Shutter MD

## 2018-04-19 ENCOUNTER — Ambulatory Visit (HOSPITAL_COMMUNITY): Payer: Medicare Other

## 2018-04-19 ENCOUNTER — Encounter: Payer: Self-pay | Admitting: Internal Medicine

## 2018-04-25 ENCOUNTER — Other Ambulatory Visit (HOSPITAL_COMMUNITY)
Admission: RE | Admit: 2018-04-25 | Discharge: 2018-04-25 | Disposition: A | Discharge status: Home / Self Care | Payer: Medicare Other | Source: Ambulatory Visit | Attending: Hematology | Admitting: Hematology

## 2018-04-25 ENCOUNTER — Ambulatory Visit (HOSPITAL_COMMUNITY)
Admission: RE | Admit: 2018-04-25 | Discharge: 2018-04-25 | Disposition: A | Discharge status: Home / Self Care | Payer: Medicare Other | Source: Ambulatory Visit | Attending: Gastroenterology | Admitting: Gastroenterology

## 2018-04-25 DIAGNOSIS — R7989 Other specified abnormal findings of blood chemistry: Secondary | ICD-10-CM

## 2018-04-25 DIAGNOSIS — K760 Fatty (change of) liver, not elsewhere classified: Secondary | ICD-10-CM | POA: Diagnosis not present

## 2018-04-25 DIAGNOSIS — R945 Abnormal results of liver function studies: Secondary | ICD-10-CM | POA: Diagnosis not present

## 2018-04-25 LAB — HEMOCHROMATOSIS DNA-PCR(C282Y,H63D)

## 2018-04-27 ENCOUNTER — Telehealth: Payer: Self-pay | Admitting: Internal Medicine

## 2018-04-27 NOTE — Telephone Encounter (Signed)
Tried calling pt back. VM is full.

## 2018-04-27 NOTE — Telephone Encounter (Signed)
Discussed results with pt.

## 2018-04-27 NOTE — Telephone Encounter (Signed)
[phone]  Patient stated someone from this office called her, please call back

## 2018-05-02 ENCOUNTER — Other Ambulatory Visit: Payer: Self-pay

## 2018-05-02 ENCOUNTER — Other Ambulatory Visit (HOSPITAL_COMMUNITY)
Admission: RE | Admit: 2018-05-02 | Discharge: 2018-05-02 | Disposition: A | Discharge status: Home / Self Care | Payer: Medicare Other | Source: Ambulatory Visit | Attending: Family Medicine | Admitting: Family Medicine

## 2018-05-02 DIAGNOSIS — E1165 Type 2 diabetes mellitus with hyperglycemia: Secondary | ICD-10-CM | POA: Insufficient documentation

## 2018-05-02 DIAGNOSIS — I10 Essential (primary) hypertension: Secondary | ICD-10-CM | POA: Diagnosis not present

## 2018-05-02 DIAGNOSIS — Z79899 Other long term (current) drug therapy: Secondary | ICD-10-CM | POA: Insufficient documentation

## 2018-05-02 DIAGNOSIS — E039 Hypothyroidism, unspecified: Secondary | ICD-10-CM | POA: Insufficient documentation

## 2018-05-02 LAB — BASIC METABOLIC PANEL
Anion gap: 12 (ref 5–15)
BUN: 18 mg/dL (ref 8–23)
CALCIUM: 9.5 mg/dL (ref 8.9–10.3)
CO2: 26 mmol/L (ref 22–32)
Chloride: 97 mmol/L — ABNORMAL LOW (ref 98–111)
Creatinine, Ser: 1.17 mg/dL — ABNORMAL HIGH (ref 0.44–1.00)
GFR calc Af Amer: 56 mL/min — ABNORMAL LOW (ref >60)
GFR calc non Af Amer: 48 mL/min — ABNORMAL LOW (ref >60)
Glucose, Bld: 329 mg/dL — ABNORMAL HIGH (ref 70–99)
Potassium: 3.7 mmol/L (ref 3.5–5.1)
Sodium: 135 mmol/L (ref 135–145)

## 2018-05-02 LAB — LIPID PANEL
Cholesterol: 252 mg/dL — ABNORMAL HIGH (ref 0–200)
HDL: 66 mg/dL (ref >40)
LDL Cholesterol: 158 mg/dL — ABNORMAL HIGH (ref 0–99)
Total CHOL/HDL Ratio: 3.8 RATIO
Triglycerides: 141 mg/dL (ref <150)
VLDL: 28 mg/dL (ref 0–40)

## 2018-05-02 LAB — HEMOGLOBIN A1C
Hgb A1c MFr Bld: 11.8 % — ABNORMAL HIGH (ref 4.8–5.6)
Mean Plasma Glucose: 291.96 mg/dL

## 2018-05-02 LAB — TSH: TSH: 1.698 u[IU]/mL (ref 0.350–4.500)

## 2018-05-09 DIAGNOSIS — Z79899 Other long term (current) drug therapy: Secondary | ICD-10-CM | POA: Diagnosis not present

## 2018-05-09 DIAGNOSIS — G894 Chronic pain syndrome: Secondary | ICD-10-CM | POA: Diagnosis not present

## 2018-05-09 DIAGNOSIS — E1165 Type 2 diabetes mellitus with hyperglycemia: Secondary | ICD-10-CM | POA: Diagnosis not present

## 2018-05-09 DIAGNOSIS — E039 Hypothyroidism, unspecified: Secondary | ICD-10-CM | POA: Diagnosis not present

## 2018-05-09 DIAGNOSIS — Z79891 Long term (current) use of opiate analgesic: Secondary | ICD-10-CM | POA: Diagnosis not present

## 2018-05-09 DIAGNOSIS — M545 Low back pain: Secondary | ICD-10-CM | POA: Diagnosis not present

## 2018-05-09 DIAGNOSIS — M25531 Pain in right wrist: Secondary | ICD-10-CM | POA: Diagnosis not present

## 2018-05-30 ENCOUNTER — Telehealth: Payer: Self-pay | Admitting: Internal Medicine

## 2018-05-30 NOTE — Telephone Encounter (Signed)
Spoke with pt. Originally mailed copy of Fatty liver instructions to pt. This time Fatty liver instructions were placed in a letter and mailed to pt. Pt is aware that Manuela Schwartz will mail her [place] of [place] results.

## 2018-05-30 NOTE — Telephone Encounter (Signed)
Pt called earlier asking to speak with the nurse. [phone] She also said that we were suppose to be mailing her something about a test and hasn't received it yet. I asked her was she needing a copy of results and she said yes. I verified her address and had verbal permission to mail her [place] of [place] to her.

## 2018-06-06 DIAGNOSIS — Z79899 Other long term (current) drug therapy: Secondary | ICD-10-CM | POA: Diagnosis not present

## 2018-06-06 DIAGNOSIS — I10 Essential (primary) hypertension: Secondary | ICD-10-CM | POA: Diagnosis not present

## 2018-06-06 DIAGNOSIS — Z794 Long term (current) use of insulin: Secondary | ICD-10-CM | POA: Diagnosis not present

## 2018-06-06 DIAGNOSIS — M545 Low back pain: Secondary | ICD-10-CM | POA: Diagnosis not present

## 2018-06-06 DIAGNOSIS — E1140 Type 2 diabetes mellitus with diabetic neuropathy, unspecified: Secondary | ICD-10-CM | POA: Diagnosis not present

## 2018-06-20 ENCOUNTER — Other Ambulatory Visit (HOSPITAL_COMMUNITY): Payer: Medicare Other

## 2018-06-27 ENCOUNTER — Ambulatory Visit (HOSPITAL_COMMUNITY): Payer: Medicare Other | Admitting: Nurse Practitioner

## 2018-06-29 ENCOUNTER — Ambulatory Visit (INDEPENDENT_AMBULATORY_CARE_PROVIDER_SITE_OTHER): Payer: Medicare Other | Admitting: Gastroenterology

## 2018-06-29 ENCOUNTER — Other Ambulatory Visit: Payer: Self-pay

## 2018-06-29 ENCOUNTER — Encounter: Payer: Self-pay | Admitting: Gastroenterology

## 2018-06-29 ENCOUNTER — Encounter: Payer: Self-pay | Admitting: Internal Medicine

## 2018-06-29 DIAGNOSIS — R945 Abnormal results of liver function studies: Secondary | ICD-10-CM

## 2018-06-29 DIAGNOSIS — K5900 Constipation, unspecified: Secondary | ICD-10-CM

## 2018-06-29 DIAGNOSIS — R1310 Dysphagia, unspecified: Secondary | ICD-10-CM | POA: Diagnosis not present

## 2018-06-29 DIAGNOSIS — R7989 Other specified abnormal findings of blood chemistry: Secondary | ICD-10-CM

## 2018-06-29 NOTE — Progress Notes (Signed)
Primary Care Physician:  Jani Gravel, MD  Primary GI: Dr. Gala Romney   Virtual Visit via Telephone Note Due to COVID-19, visit is conducted virtually and was requested by patient.   I connected with Stephanie [name] on 06/29/18 at  9:00 AM EDT by telephone and verified that I am speaking with the correct person using two identifiers.   I discussed the limitations, risks, security and privacy concerns of performing an evaluation and management service by telephone and the availability of in person appointments. I also discussed with the patient that there may be a patient responsible charge related to this service. The patient expressed understanding and agreed to proceed.  Chief Complaint  Patient presents with  . ABNORMAL LFT     History of Present Illness: 68 y.o. female here for GERD/constipation.  She has a history of elevated LFTs as well.  EGD and colonoscopy in 2017 for heme positive stool showed diverticulosis, normal EGD with empiric dilation of the esophagus.Thorough work-up of elevated alk phos (initially isolated elevated alk phos but now AST/ALT elevated as well)  in setting of fatty liver with AMA, ANA negative. ASMA moderately positive. Negative Hep B, C, A. Followed by oncology with history of nephrotic syndrome, IgG kappa monoclonal gammopathy, observed for now.Liver biopsy with mild chronic hepatitis, mild fatty liver, non significant fibrosis, non-specific findings and likely secondary to drug-induced etiology.She has a history of nonspecific esophageal motility disorder on barium pill esophagram January 2019.  Alk phos increasing, with updated labs: +ANA and weakly positive ASMA at 30 (just below moderate to strong positive) [place] with fatty liver. Repeat AMA negative. GGT elevated 872. Hemochromatosis negative.   Occasional vague solid and liquid dysphagia started back again last year. Last EGD 2017 with empiric dilatation and improvement of dysphagia at that time. Dysphagia now  once or twice a month. No odynophagia. Some heartburn. If gets too bad, will take baking soda. Dexilant once daily. No abdominal pain. Appetite comes and goes. BPE Jan 2019 with nonspecific esophageal motility disorder. Wants to hold off on EGD right now.   Constipation: Amitiza 8 mcg BID. No rectal bleeding.   Repeat blood work (including LFTs) upcoming 5/18.   Past Medical History:  Diagnosis Date  . Anemia   . Arthritis   . Asthma   . COPD (chronic obstructive pulmonary disease) ([place])   . Deep vein thrombosis (DVT) of both lower extremities ([place]) 06/27/2015  . Diabetes mellitus   . Fibromyalgia   . GERD (gastroesophageal reflux disease)   . H/O hiatal hernia   . Hypercholesteremia   . Hypertension   . Hyperthyroidism   . IBS (irritable bowel syndrome)   . Inner ear disease   . 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 PONV (postoperative nausea and vomiting)   . Tachycardia      Past Surgical History:  Procedure Laterality Date  . ABDOMINAL HYSTERECTOMY  partial  . CARPAL TUNNEL RELEASE Right 1991  . CATARACT EXTRACTION W/PHACO Right 05/08/2013   Procedure: CATARACT EXTRACTION PHACO AND INTRAOCULAR LENS PLACEMENT (IOC);  Surgeon: Tonny Branch, MD;  Location: [facility];  Service: Ophthalmology;  Laterality: Right;  CDE 10.31  . CATARACT EXTRACTION W/PHACO Left 08/17/2013   Procedure: CATARACT EXTRACTION PHACO AND INTRAOCULAR LENS PLACEMENT (IOC);  Surgeon: Tonny Branch, MD;  Location: [facility];  Service: Ophthalmology;  Laterality: Left;  CDE:9.03  . CHOLECYSTECTOMY  1971  . COLONOSCOPY WITH PROPOFOL N/A 01/06/2016   Dr. Gala Romney: diverticulosis   .  DENTAL SURGERY    . ESOPHAGOGASTRODUODENOSCOPY (EGD) WITH PROPOFOL N/A 01/06/2016   Dr. Gala Romney: normal s/p empiric dilation   . EYE SURGERY    . MALONEY DILATION N/A 01/06/2016   Procedure: Venia Minks DILATION;  Surgeon: Daneil Dolin, MD;  [facility];  Service: Endoscopy;  Laterality: N/A;  . REVERSE  SHOULDER ARTHROPLASTY Right 08/06/2017   Procedure: RIGHT REVERSE SHOULDER ARTHROPLASTY;  Surgeon: Netta Cedars, MD;  Location: [facility];  Service: Orthopedics;  Laterality: Right;  . WRIST GANGLION EXCISION Left      Current Meds  Medication Sig  . ACCU-CHEK AVIVA PLUS test strip USE TO CHECK BLOOD SUGAR TID  . acetaminophen (TYLENOL) 500 MG tablet Take 1,000 mg by mouth as needed.   Marland Kitchen albuterol (PROVENTIL HFA;VENTOLIN HFA) 108 (90 BASE) MCG/ACT inhaler Inhale 2 puffs into the lungs every 6 (six) hours as needed for wheezing or shortness of breath.  Marland Kitchen apixaban (ELIQUIS) 5 MG TABS tablet Take 10 mg by mouth BID through Friday 5/19 evening. Take 11m by mouth BID starting 5/20 AM. (Patient taking differently: Take 5 mg by mouth 2 (two) times daily. )  . ARNUITY ELLIPTA 100 MCG/ACT AEPB Inhale 1 puff into the lungs daily.   . Ascorbic Acid (VITAMIN C) 1000 MG tablet Take 1,000 mg by mouth daily.   . benzonatate (TESSALON PERLES) 100 MG capsule Take 1-2 capsules by mouth daily as needed (cough).   . cetirizine (ZYRTEC) 10 MG tablet Take 10 mg by mouth daily.  . Cholecalciferol (VITAMIN D) 2000 units tablet Take 4,000 Units by mouth daily.   . cimetidine (TAGAMET) 200 MG tablet Take 0.5 tablets (100 mg total) by mouth at bedtime.  .Marland KitchenCINNAMON PO Take 2-3 capsules by mouth 2 (two) times daily as needed (high blood sugar).   . clobetasol ointment (TEMOVATE) 08.58% Apply 1 application topically 2 (two) times daily as needed (rash).   .Marland KitchenCod Liver Oil CAPS Take 1 capsule by mouth at bedtime.   .Marland KitchenDEXILANT 60 MG capsule TAKE 1 CAPSULE BY MOUTH ONCE DAILY  . diclofenac sodium (VOLTAREN) 1 % GEL Apply 2 g topically 2 (two) times daily.  . Doxepin HCl 5 % CREA apply 2 grams (2 grams=2 inches) to affected area(s) 3 times daily. Takes as needed  . DULoxetine (CYMBALTA) 30 MG capsule Take 1 capsule (30 mg total) by mouth 2 (two) times daily.  . fluticasone (VERAMYST) 27.5 MCG/SPRAY nasal spray Place 2 sprays into  the nose daily.   .Marland KitchenHUMALOG KWIKPEN 100 UNIT/ML KiwkPen INJECT 30 UNITS SUBCUTANEOUSLY THREE TIMES DAILY BEFORE MEALS FOR SUGAR  . hydrOXYzine (ATARAX/VISTARIL) 25 MG tablet Take 25 mg by mouth every 8 (eight) hours as needed for itching.   . levothyroxine (SYNTHROID, LEVOTHROID) 100 MCG tablet Take 100 mcg by mouth daily before breakfast.   . lubiprostone (AMITIZA) 8 MCG capsule Take 1 capsule (8 mcg total) by mouth 2 (two) times daily with a meal.  . LYRICA 75 MG capsule Take 75 mg by mouth 2 (two) times daily.   . methocarbamol (ROBAXIN) 500 MG tablet Take 1 tablet (500 mg total) by mouth 3 (three) times daily as needed.  . metolazone (ZAROXOLYN) 5 MG tablet Take 1 tablet (5 mg total) by mouth 2 (two) times daily.  . metoprolol tartrate (LOPRESSOR) 25 MG tablet Take 1 tablet (25 mg total) by mouth 2 (two) times daily.  .Marland KitchenPOTASSIUM CHLORIDE PO Take by mouth daily. Unsure of strength  . pravastatin (PRAVACHOL)  80 MG tablet Take 1 tablet (80 mg total) by mouth at bedtime.  Marland Kitchen PROAIR RESPICLICK 163 (90 Base) MCG/ACT AEPB Inhale 2 puffs into the lungs as needed.   . torsemide (DEMADEX) 20 MG tablet Take 2 tablets (40 mg total) by mouth daily.  . traMADol (ULTRAM) 50 MG tablet TK 1 T PO TID FOR PAIN  . traZODone (DESYREL) 50 MG tablet Take 50 mg by mouth at bedtime.  . vitamin B-12 (CYANOCOBALAMIN) 1000 MCG tablet Take 1,000 mcg by mouth daily.   . vitamin E 200 UNIT capsule Take 200 Units by mouth daily.   . XULTOPHY 100-3.6 UNIT-MG/ML SOPN 60 Units daily.      Family History  Problem Relation Age of Onset  . Colon cancer Neg Hx     Social History   Socioeconomic History  . Marital status: Married    Spouse name: Not on file  . Number of children: Not on file  . Years of education: Not on file  . Highest education level: Not on file  Occupational History  . Not on file  Social Needs  . Financial resource strain: Not on file  . Food insecurity:    Worry: Not on file    Inability:  Not on file  . Transportation needs:    Medical: Not on file    Non-medical: Not on file  Tobacco Use  . Smoking status: Former Smoker    Packs/day: 0.25    Years: 30.00    Pack years: 7.50    Types: Cigarettes    Last attempt to quit: 02/17/2014    Years since quitting: 4.3  . Smokeless tobacco: Never Used  . Tobacco comment: some day smoker  Substance and Sexual Activity  . Alcohol use: Yes    Alcohol/week: 0.0 standard drinks    Comment: occas  . Drug use: No  . Sexual activity: Not Currently    Birth control/protection: Surgical  Lifestyle  . Physical activity:    Days per week: Not on file    Minutes per session: Not on file  . Stress: Not on file  Relationships  . Social connections:    Talks on phone: Not on file    Gets together: Not on file    Attends religious service: Not on file    Active member of club or organization: Not on file    Attends meetings of clubs or organizations: Not on file    Relationship status: Not on file  Other Topics Concern  . Not on file  Social History Narrative  . Not on file       Review of Systems: Gen: Denies fever, chills, anorexia. Denies fatigue, weakness, weight loss.  CV: Denies chest pain, palpitations, syncope, peripheral edema, and claudication. Resp: Denies dyspnea at rest, cough, wheezing, coughing up blood, and pleurisy. GI: see HPI Derm: Denies rash, itching, dry skin Psych: Denies depression, anxiety, memory loss, confusion. No homicidal or suicidal ideation.  Heme: Denies bruising, bleeding, and enlarged lymph nodes.  Observations/Objective: No distress. Unable to perform physical exam due to telephone encounter. No video available.   Assessment and Plan: 68 year old female with history of elevated LFTs with known fatty liver, mixed pattern, with increasing alk phos. Prior evaluation in past with positive ASMA, negative AMA. Repeat AMA remains negative. GGT elevated at 872. Liver biopsy in past with mild  chronic hepatitis, mild fatty liver, non-significant fibrosis, non-specific findings and likely secondary to drug-induced etiology. She has upcoming repeat LFTs. We discussed  possible repeat liver biopsy in future. Will await LFTs. US abdomen on file March 2020.  Dysphagia: wants to hold off on EGD for now.   Constipation: continue Amitiza BID.     Follow Up Instructions:    I discussed the assessment and treatment plan with the patient. The patient was provided an opportunity to ask questions and all were answered. The patient agreed with the plan and demonstrated an understanding of the instructions.   The patient was advised to call back or seek an in-person evaluation if the symptoms worsen or if the condition fails to improve as anticipated.  I provided 15 minutes of non-face-to-face time during this encounter.  Annitta Needs, PhD, ANP-BC [facility]

## 2018-06-29 NOTE — Patient Instructions (Signed)
I will check on results of your liver numbers that are upcoming!   Continue Amitiza twice a day and Dexilant once daily.  We will see you back in 3-4 months!   I enjoyed talking with you again today! As you know, I value our relationship and want to provide genuine, compassionate, and quality care. I welcome your feedback. If you receive a survey regarding your visit,  I greatly appreciate you taking time to fill this out. See you next time!  Annitta Needs, PhD, ANP-BC [facility]

## 2018-07-01 ENCOUNTER — Other Ambulatory Visit (HOSPITAL_COMMUNITY): Payer: Self-pay | Admitting: *Deleted

## 2018-07-01 DIAGNOSIS — D472 Monoclonal gammopathy: Secondary | ICD-10-CM

## 2018-07-01 DIAGNOSIS — D649 Anemia, unspecified: Secondary | ICD-10-CM

## 2018-07-01 DIAGNOSIS — D509 Iron deficiency anemia, unspecified: Secondary | ICD-10-CM

## 2018-07-04 ENCOUNTER — Inpatient Hospital Stay (HOSPITAL_COMMUNITY): Payer: Medicare Other | Attending: Hematology

## 2018-07-04 ENCOUNTER — Other Ambulatory Visit: Payer: Self-pay

## 2018-07-04 DIAGNOSIS — I10 Essential (primary) hypertension: Secondary | ICD-10-CM | POA: Diagnosis not present

## 2018-07-04 DIAGNOSIS — E119 Type 2 diabetes mellitus without complications: Secondary | ICD-10-CM | POA: Diagnosis not present

## 2018-07-04 DIAGNOSIS — E611 Iron deficiency: Secondary | ICD-10-CM | POA: Insufficient documentation

## 2018-07-04 DIAGNOSIS — Z791 Long term (current) use of non-steroidal anti-inflammatories (NSAID): Secondary | ICD-10-CM | POA: Diagnosis not present

## 2018-07-04 DIAGNOSIS — M797 Fibromyalgia: Secondary | ICD-10-CM | POA: Insufficient documentation

## 2018-07-04 DIAGNOSIS — Z87891 Personal history of nicotine dependence: Secondary | ICD-10-CM | POA: Diagnosis not present

## 2018-07-04 DIAGNOSIS — Z7901 Long term (current) use of anticoagulants: Secondary | ICD-10-CM | POA: Insufficient documentation

## 2018-07-04 DIAGNOSIS — Z86718 Personal history of other venous thrombosis and embolism: Secondary | ICD-10-CM | POA: Insufficient documentation

## 2018-07-04 DIAGNOSIS — Z79899 Other long term (current) drug therapy: Secondary | ICD-10-CM | POA: Diagnosis not present

## 2018-07-04 DIAGNOSIS — R7989 Other specified abnormal findings of blood chemistry: Secondary | ICD-10-CM | POA: Insufficient documentation

## 2018-07-04 DIAGNOSIS — R1310 Dysphagia, unspecified: Secondary | ICD-10-CM | POA: Insufficient documentation

## 2018-07-04 DIAGNOSIS — D472 Monoclonal gammopathy: Secondary | ICD-10-CM

## 2018-07-04 DIAGNOSIS — D509 Iron deficiency anemia, unspecified: Secondary | ICD-10-CM

## 2018-07-04 DIAGNOSIS — D649 Anemia, unspecified: Secondary | ICD-10-CM

## 2018-07-04 LAB — CBC WITH DIFFERENTIAL/PLATELET
Abs Immature Granulocytes: 0.04 10*3/uL (ref 0.00–0.07)
Basophils Absolute: 0 10*3/uL (ref 0.0–0.1)
Basophils Relative: 0 %
Eosinophils Absolute: 0.5 10*3/uL (ref 0.0–0.5)
Eosinophils Relative: 4 %
HCT: 40 % (ref 36.0–46.0)
Hemoglobin: 12.1 g/dL (ref 12.0–15.0)
Immature Granulocytes: 0 %
Lymphocytes Relative: 36 %
Lymphs Abs: 4.5 10*3/uL — ABNORMAL HIGH (ref 0.7–4.0)
MCH: 24.2 pg — ABNORMAL LOW (ref 26.0–34.0)
MCHC: 30.3 g/dL (ref 30.0–36.0)
MCV: 80 fL (ref 80.0–100.0)
Monocytes Absolute: 0.9 10*3/uL (ref 0.1–1.0)
Monocytes Relative: 7 %
Neutro Abs: 6.7 10*3/uL (ref 1.7–7.7)
Neutrophils Relative %: 53 %
Platelets: 290 10*3/uL (ref 150–400)
RBC: 5 MIL/uL (ref 3.87–5.11)
RDW: 13.5 % (ref 11.5–15.5)
WBC: 12.6 10*3/uL — ABNORMAL HIGH (ref 4.0–10.5)
nRBC: 0 % (ref 0.0–0.2)

## 2018-07-04 LAB — COMPREHENSIVE METABOLIC PANEL
ALT: 56 U/L — ABNORMAL HIGH (ref 0–44)
AST: 47 U/L — ABNORMAL HIGH (ref 15–41)
Albumin: 3.3 g/dL — ABNORMAL LOW (ref 3.5–5.0)
Alkaline Phosphatase: 328 U/L — ABNORMAL HIGH (ref 38–126)
Anion gap: 8 (ref 5–15)
BUN: 21 mg/dL (ref 8–23)
CO2: 25 mmol/L (ref 22–32)
Calcium: 9.1 mg/dL (ref 8.9–10.3)
Chloride: 103 mmol/L (ref 98–111)
Creatinine, Ser: 1.03 mg/dL — ABNORMAL HIGH (ref 0.44–1.00)
GFR calc Af Amer: >60 mL/min (ref >60)
GFR calc non Af Amer: 56 mL/min — ABNORMAL LOW (ref >60)
Glucose, Bld: 165 mg/dL — ABNORMAL HIGH (ref 70–99)
Potassium: 4 mmol/L (ref 3.5–5.1)
Sodium: 136 mmol/L (ref 135–145)
Total Bilirubin: 0.4 mg/dL (ref 0.3–1.2)
Total Protein: 8.5 g/dL — ABNORMAL HIGH (ref 6.5–8.1)

## 2018-07-04 LAB — LACTATE DEHYDROGENASE: LDH: 139 U/L (ref 98–192)

## 2018-07-04 LAB — FERRITIN: Ferritin: 288 ng/mL (ref 11–307)

## 2018-07-05 LAB — KAPPA/LAMBDA LIGHT CHAINS
Kappa free light chain: 341.1 mg/L — ABNORMAL HIGH (ref 3.3–19.4)
Kappa, lambda light chain ratio: 19.49 — ABNORMAL HIGH (ref 0.26–1.65)
Lambda free light chains: 17.5 mg/L (ref 5.7–26.3)

## 2018-07-05 LAB — PROTEIN ELECTROPHORESIS, SERUM
A/G Ratio: 0.7 (ref 0.7–1.7)
Albumin ELP: 3.2 g/dL (ref 2.9–4.4)
Alpha-1-Globulin: 0.3 g/dL (ref 0.0–0.4)
Alpha-2-Globulin: 0.9 g/dL (ref 0.4–1.0)
Beta Globulin: 1.2 g/dL (ref 0.7–1.3)
Gamma Globulin: 2.3 g/dL — ABNORMAL HIGH (ref 0.4–1.8)
Globulin, Total: 4.7 g/dL — ABNORMAL HIGH (ref 2.2–3.9)
M-Spike, %: 1.7 g/dL — ABNORMAL HIGH
Total Protein ELP: 7.9 g/dL (ref 6.0–8.5)

## 2018-07-05 NOTE — Progress Notes (Signed)
CC'ED TO PCP 

## 2018-07-18 ENCOUNTER — Other Ambulatory Visit: Payer: Self-pay

## 2018-07-18 ENCOUNTER — Encounter (HOSPITAL_COMMUNITY): Payer: Self-pay | Admitting: Hematology

## 2018-07-18 ENCOUNTER — Inpatient Hospital Stay (HOSPITAL_COMMUNITY): Payer: Medicare Other | Attending: Hematology | Admitting: Hematology

## 2018-07-18 VITALS — BP 148/70 | HR 104 | Temp 99.1°F | Resp 18 | Wt 225.0 lb

## 2018-07-18 DIAGNOSIS — K760 Fatty (change of) liver, not elsewhere classified: Secondary | ICD-10-CM | POA: Diagnosis not present

## 2018-07-18 DIAGNOSIS — Z7901 Long term (current) use of anticoagulants: Secondary | ICD-10-CM | POA: Diagnosis not present

## 2018-07-18 DIAGNOSIS — D472 Monoclonal gammopathy: Secondary | ICD-10-CM | POA: Diagnosis not present

## 2018-07-18 DIAGNOSIS — M797 Fibromyalgia: Secondary | ICD-10-CM | POA: Diagnosis not present

## 2018-07-18 DIAGNOSIS — E611 Iron deficiency: Secondary | ICD-10-CM

## 2018-07-18 DIAGNOSIS — Z86718 Personal history of other venous thrombosis and embolism: Secondary | ICD-10-CM | POA: Diagnosis not present

## 2018-07-18 DIAGNOSIS — Z79899 Other long term (current) drug therapy: Secondary | ICD-10-CM

## 2018-07-18 NOTE — Progress Notes (Signed)
[place], [place] [address]   CLINIC:  Medical Oncology/Hematology  PCP:  Jani Gravel, [address] [place] [id number] [phone]   REASON FOR VISIT:  Follow-up for MGUS  CURRENT THERAPY: Clinical Surveillance    INTERVAL HISTORY:  Stephanie [name] 68 y.o. female presents today for follow up. She reports overall doing well. Denies any significant fatigue. Her main concern today is joint pain. She is followed by her PCP and Rheumatologist for this. She denies any fevers, chills, night sweats or abnormal weight loss/gain. No lymphadenopathy. Denies any peripheral neuropathies. She is here for repeat labs and office visit.   REVIEW OF SYSTEMS:  Review of Systems  Constitutional: Negative.   HENT:  Negative.   Eyes: Negative.   Respiratory: Negative.   Cardiovascular: Negative.   Gastrointestinal: Negative.   Endocrine: Negative.   Genitourinary: Negative.    Musculoskeletal: Positive for arthralgias, back pain and myalgias.  Skin: Negative.   Neurological: Negative.   Hematological: Negative.   Psychiatric/Behavioral: Negative.      PAST MEDICAL/SURGICAL HISTORY:  Past Medical History:  Diagnosis Date  . Anemia   . Arthritis   . Asthma   . COPD (chronic obstructive pulmonary disease) ([place])   . Deep vein thrombosis (DVT) of both lower extremities ([place]) 06/27/2015  . Diabetes mellitus   . Fibromyalgia   . GERD (gastroesophageal reflux disease)   . H/O hiatal hernia   . Hypercholesteremia   . Hypertension   . Hyperthyroidism   . IBS (irritable bowel syndrome)   . Inner ear disease   . 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 PONV (postoperative nausea and vomiting)   . Tachycardia    Past Surgical History:  Procedure Laterality Date  . ABDOMINAL HYSTERECTOMY  partial  . CARPAL TUNNEL RELEASE Right 1991  . CATARACT EXTRACTION W/PHACO Right 05/08/2013   Procedure: CATARACT EXTRACTION  PHACO AND INTRAOCULAR LENS PLACEMENT (IOC);  Surgeon: Tonny Branch, MD;  Location: [facility];  Service: Ophthalmology;  Laterality: Right;  CDE 10.31  . CATARACT EXTRACTION W/PHACO Left 08/17/2013   Procedure: CATARACT EXTRACTION PHACO AND INTRAOCULAR LENS PLACEMENT (IOC);  Surgeon: Tonny Branch, MD;  Location: [facility];  Service: Ophthalmology;  Laterality: Left;  CDE:9.03  . CHOLECYSTECTOMY  1971  . COLONOSCOPY WITH PROPOFOL N/A 01/06/2016   Dr. Gala Romney: diverticulosis   . DENTAL SURGERY    . ESOPHAGOGASTRODUODENOSCOPY (EGD) WITH PROPOFOL N/A 01/06/2016   Dr. Gala Romney: normal s/p empiric dilation   . EYE SURGERY    . MALONEY DILATION N/A 01/06/2016   Procedure: Venia Minks DILATION;  Surgeon: Daneil Dolin, MD;  [facility];  Service: Endoscopy;  Laterality: N/A;  . REVERSE SHOULDER ARTHROPLASTY Right 08/06/2017   Procedure: RIGHT REVERSE SHOULDER ARTHROPLASTY;  Surgeon: Netta Cedars, MD;  Location: [facility];  Service: Orthopedics;  Laterality: Right;  . WRIST GANGLION EXCISION Left      SOCIAL HISTORY:  Social History   Socioeconomic History  . Marital status: Married    Spouse name: Not on file  . Number of children: Not on file  . Years of education: Not on file  . Highest education level: Not on file  Occupational History  . Not on file  Social Needs  . Financial resource strain: Not on file  . Food insecurity:    Worry: Not on file    Inability: Not on file  . Transportation needs:    Medical: Not on  file    Non-medical: Not on file  Tobacco Use  . Smoking status: Former Smoker    Packs/day: 0.25    Years: 30.00    Pack years: 7.50    Types: Cigarettes    Last attempt to quit: 02/17/2014    Years since quitting: 4.4  . Smokeless tobacco: Never Used  . Tobacco comment: some day smoker  Substance and Sexual Activity  . Alcohol use: Yes    Alcohol/week: 0.0 standard drinks    Comment: occas  . Drug use: No  . Sexual activity: Not Currently    Birth control/protection:  Surgical  Lifestyle  . Physical activity:    Days per week: Not on file    Minutes per session: Not on file  . Stress: Not on file  Relationships  . Social connections:    Talks on phone: Not on file    Gets together: Not on file    Attends religious service: Not on file    Active member of club or organization: Not on file    Attends meetings of clubs or organizations: Not on file    Relationship status: Not on file  . Intimate partner violence:    Fear of current or ex partner: Not on file    Emotionally abused: Not on file    Physically abused: Not on file    Forced sexual activity: Not on file  Other Topics Concern  . Not on file  Social History Narrative  . Not on file    FAMILY HISTORY:  Family History  Problem Relation Age of Onset  . Colon cancer Neg Hx     CURRENT MEDICATIONS:  Outpatient Encounter Medications as of 07/18/2018  Medication Sig  . ACCU-CHEK AVIVA PLUS test strip USE TO CHECK BLOOD SUGAR TID  . acetaminophen (TYLENOL) 500 MG tablet Take 1,000 mg by mouth as needed.   Marland Kitchen albuterol (PROVENTIL HFA;VENTOLIN HFA) 108 (90 BASE) MCG/ACT inhaler Inhale 2 puffs into the lungs every 6 (six) hours as needed for wheezing or shortness of breath.  Marland Kitchen apixaban (ELIQUIS) 5 MG TABS tablet Take 10 mg by mouth BID through Friday 5/19 evening. Take 42m by mouth BID starting 5/20 AM. (Patient taking differently: Take 5 mg by mouth 2 (two) times daily. )  . ARNUITY ELLIPTA 100 MCG/ACT AEPB Inhale 1 puff into the lungs daily.   . Ascorbic Acid (VITAMIN C) 1000 MG tablet Take 1,000 mg by mouth daily.   . benzonatate (TESSALON PERLES) 100 MG capsule Take 1-2 capsules by mouth daily as needed (cough).   . cetirizine (ZYRTEC) 10 MG tablet Take 10 mg by mouth daily.  . Cholecalciferol (VITAMIN D) 2000 units tablet Take 4,000 Units by mouth daily.   . cimetidine (TAGAMET) 200 MG tablet Take 0.5 tablets (100 mg total) by mouth at bedtime.  .Marland KitchenCINNAMON PO Take 2-3 capsules by mouth 2  (two) times daily as needed (high blood sugar).   . clobetasol ointment (TEMOVATE) 04.98% Apply 1 application topically 2 (two) times daily as needed (rash).   .Marland KitchenCod Liver Oil CAPS Take 1 capsule by mouth at bedtime.   .Marland KitchenDEXILANT 60 MG capsule TAKE 1 CAPSULE BY MOUTH ONCE DAILY  . diclofenac sodium (VOLTAREN) 1 % GEL Apply 2 g topically 2 (two) times daily.  . Doxepin HCl 5 % CREA apply 2 grams (2 grams=2 inches) to affected area(s) 3 times daily. Takes as needed  . DULoxetine (CYMBALTA) 30 MG capsule Take 1 capsule (  30 mg total) by mouth 2 (two) times daily.  . fluticasone (VERAMYST) 27.5 MCG/SPRAY nasal spray Place 2 sprays into the nose daily.   Marland Kitchen HUMALOG KWIKPEN 100 UNIT/ML KiwkPen INJECT 30 UNITS SUBCUTANEOUSLY THREE TIMES DAILY BEFORE MEALS FOR SUGAR  . hydrOXYzine (ATARAX/VISTARIL) 25 MG tablet Take 25 mg by mouth every 8 (eight) hours as needed for itching.   . levothyroxine (SYNTHROID, LEVOTHROID) 100 MCG tablet Take 100 mcg by mouth daily before breakfast.   . lubiprostone (AMITIZA) 8 MCG capsule Take 1 capsule (8 mcg total) by mouth 2 (two) times daily with a meal.  . LYRICA 75 MG capsule Take 75 mg by mouth 2 (two) times daily.   . methocarbamol (ROBAXIN) 500 MG tablet Take 1 tablet (500 mg total) by mouth 3 (three) times daily as needed.  . metolazone (ZAROXOLYN) 5 MG tablet Take 1 tablet (5 mg total) by mouth 2 (two) times daily.  . metoprolol tartrate (LOPRESSOR) 25 MG tablet Take 1 tablet (25 mg total) by mouth 2 (two) times daily.  Marland Kitchen POTASSIUM CHLORIDE PO Take by mouth daily. Unsure of strength  . pravastatin (PRAVACHOL) 80 MG tablet Take 1 tablet (80 mg total) by mouth at bedtime.  Marland Kitchen PROAIR RESPICLICK 409 (90 Base) MCG/ACT AEPB Inhale 2 puffs into the lungs as needed.   . torsemide (DEMADEX) 20 MG tablet Take 2 tablets (40 mg total) by mouth daily.  . traMADol (ULTRAM) 50 MG tablet TK 1 T PO TID FOR PAIN  . traZODone (DESYREL) 50 MG tablet Take 50 mg by mouth at bedtime.  .  vitamin B-12 (CYANOCOBALAMIN) 1000 MCG tablet Take 1,000 mcg by mouth daily.   . vitamin E 200 UNIT capsule Take 200 Units by mouth daily.   . XULTOPHY 100-3.6 UNIT-MG/ML SOPN 60 Units daily.    No facility-administered encounter medications on file as of 07/18/2018.     ALLERGIES:  Allergies  Allergen Reactions  . Tetracyclines & Related Anaphylaxis  . Banana Hives and Nausea And Vomiting  . Penicillins Rash and Other (See Comments)    Has patient had a PCN reaction causing immediate rash, facial/tongue/throat swelling, SOB or lightheadedness with hypotension: No Has patient had a PCN reaction causing severe rash involving mucus membranes or skin necrosis: No Has patient had a PCN reaction that required hospitalization No Has patient had a PCN reaction occurring within the last 10 years: No If all of the above answers are "NO", then may proceed with Cephalosporin use.      PHYSICAL EXAM:  ECOG Performance status: 1  Vitals:   07/18/18 1000  BP: (!) 148/70  Pulse: (!) 104  Resp: 18  Temp: 99.1 F (37.3 C)  SpO2: 100%   Filed Weights   07/18/18 1000  Weight: 225 lb (102.1 kg)    Physical Exam Constitutional:      Appearance: Normal appearance. She is obese.  HENT:     Head: Normocephalic.     Nose: Nose normal.     Mouth/Throat:     Mouth: Mucous membranes are moist.     Pharynx: Oropharynx is clear.  Eyes:     Conjunctiva/sclera: Conjunctivae normal.  Neck:     Musculoskeletal: Normal range of motion.  Cardiovascular:     Rate and Rhythm: Normal rate and regular rhythm.  Pulmonary:     Effort: Pulmonary effort is normal.     Breath sounds: Normal breath sounds.  Abdominal:     General: Bowel sounds are normal.  Palpations: Abdomen is soft.  Musculoskeletal: Normal range of motion.     Right lower leg: Edema present.     Left lower leg: Edema present.  Skin:    General: Skin is warm and dry.  Neurological:     General: No focal deficit present.      Mental Status: She is alert and oriented to person, place, and time.  Psychiatric:        Mood and Affect: Mood normal.        Behavior: Behavior normal.        Thought Content: Thought content normal.        Judgment: Judgment normal.      LABORATORY DATA:  I have reviewed the labs as listed.  CBC    Component Value Date/Time   WBC 12.6 (H) 07/04/2018 1253   RBC 5.00 07/04/2018 1253   HGB 12.1 07/04/2018 1253   HCT 40.0 07/04/2018 1253   PLT 290 07/04/2018 1253   MCV 80.0 07/04/2018 1253   MCH 24.2 (L) 07/04/2018 1253   MCHC 30.3 07/04/2018 1253   RDW 13.5 07/04/2018 1253   LYMPHSABS 4.5 (H) 07/04/2018 1253   MONOABS 0.9 07/04/2018 1253   EOSABS 0.5 07/04/2018 1253   BASOSABS 0.0 07/04/2018 1253       ASSESSMENT & PLAN:   MGUS (monoclonal gammopathy of unknown significance) 1.IgG kappa MGUS: -Bone marrow aspiration on 09/03/2015 showing 9% plasma cells and negative skeletal survey.   - History of nephrotic range proteinuria, resolved now but as per patient - Skeletal survey reviewed by me on 12/27/2017 did not show any lytic lesions. - We discussed blood work.  M spike slightly down at 1.7previously 1.9. Free light chain ratio improved to 19.49, previously 52.14. Kappa light chains 341.1, previously 876.0 Creatinine is at her baseline.  Calcium was normal. Hemoglobin: 12.1 - Plan to repeat MM labs in 3 mths and Skeletal Survey in 6 mths.  - RTC in 3 mths.   2.  Iron deficiency state: - Last Feraheme infusion on 06/25/2017 and 07/16/2017. - Ferritin was 288. She does not require any parenteral iron at this time.  3.  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  She is on Eliquis indefinitely and tolerating it well.  Apparently her primary care doctor wants to discontinue anticoagulation.  4. Fibromyalgia: -She is on tramadol, Cymbalta and Lyrica.  Takes Tylenol as needed.  5.  Elevated liver  enzymes: - This is likely from fatty liver.  Ultrasound in April 2017 shows fatty infiltration of the liver.        Orders placed this encounter:  Orders Placed This Encounter  Procedures  . CBC with Differential  . Comprehensive metabolic panel  . Multiple Myeloma Panel (SPEP&IFE w/QIG)  . Kappa/lambda light chains      [place] [phone]

## 2018-07-18 NOTE — Assessment & Plan Note (Addendum)
1.IgG kappa MGUS: -Bone marrow aspiration on 09/03/2015 showing 9% plasma cells and negative skeletal survey.   - History of nephrotic range proteinuria, resolved now but as per patient - Skeletal survey reviewed by me on 12/27/2017 did not show any lytic lesions. - We discussed blood work.  M spike slightly down at 1.7previously 1.9. Free light chain ratio improved to 19.49, previously 52.14. Kappa light chains 341.1, previously 876.0 Creatinine is at her baseline.  Calcium was normal. Hemoglobin: 12.1 - Plan to repeat MM labs in 3 mths and Skeletal Survey in 6 mths.  - RTC in 3 mths.   2.  Iron deficiency state: - Last Feraheme infusion on 06/25/2017 and 07/16/2017. - Ferritin was 288. She does not require any parenteral iron at this time.  3.  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  She is on Eliquis indefinitely and tolerating it well.  Apparently her primary care doctor wants to discontinue anticoagulation.  4. Fibromyalgia: -She is on tramadol, Cymbalta and Lyrica.  Takes Tylenol as needed.  5.  Elevated liver enzymes: - This is likely from fatty liver.  Ultrasound in April 2017 shows fatty infiltration of the liver.

## 2018-08-02 ENCOUNTER — Other Ambulatory Visit: Payer: Self-pay | Admitting: Gastroenterology

## 2018-08-02 DIAGNOSIS — K219 Gastro-esophageal reflux disease without esophagitis: Secondary | ICD-10-CM

## 2018-08-10 DIAGNOSIS — Z0001 Encounter for general adult medical examination with abnormal findings: Secondary | ICD-10-CM | POA: Diagnosis not present

## 2018-08-10 DIAGNOSIS — E1140 Type 2 diabetes mellitus with diabetic neuropathy, unspecified: Secondary | ICD-10-CM | POA: Diagnosis not present

## 2018-08-10 DIAGNOSIS — E039 Hypothyroidism, unspecified: Secondary | ICD-10-CM | POA: Diagnosis not present

## 2018-08-10 DIAGNOSIS — Z794 Long term (current) use of insulin: Secondary | ICD-10-CM | POA: Diagnosis not present

## 2018-08-10 DIAGNOSIS — E785 Hyperlipidemia, unspecified: Secondary | ICD-10-CM | POA: Diagnosis not present

## 2018-08-16 DIAGNOSIS — Z7901 Long term (current) use of anticoagulants: Secondary | ICD-10-CM | POA: Insufficient documentation

## 2018-08-30 ENCOUNTER — Other Ambulatory Visit (HOSPITAL_COMMUNITY)
Admission: RE | Admit: 2018-08-30 | Discharge: 2018-08-30 | Disposition: A | Discharge status: Home / Self Care | Payer: Medicare Other | Source: Ambulatory Visit | Attending: Family Medicine | Admitting: Family Medicine

## 2018-08-30 ENCOUNTER — Other Ambulatory Visit: Payer: Self-pay

## 2018-08-30 DIAGNOSIS — I10 Essential (primary) hypertension: Secondary | ICD-10-CM | POA: Insufficient documentation

## 2018-08-30 DIAGNOSIS — E876 Hypokalemia: Secondary | ICD-10-CM | POA: Insufficient documentation

## 2018-08-30 DIAGNOSIS — E1165 Type 2 diabetes mellitus with hyperglycemia: Secondary | ICD-10-CM | POA: Diagnosis not present

## 2018-08-30 DIAGNOSIS — K7689 Other specified diseases of liver: Secondary | ICD-10-CM | POA: Diagnosis not present

## 2018-08-30 DIAGNOSIS — Z79899 Other long term (current) drug therapy: Secondary | ICD-10-CM | POA: Diagnosis not present

## 2018-08-30 DIAGNOSIS — E785 Hyperlipidemia, unspecified: Secondary | ICD-10-CM | POA: Insufficient documentation

## 2018-08-30 DIAGNOSIS — E039 Hypothyroidism, unspecified: Secondary | ICD-10-CM | POA: Diagnosis not present

## 2018-08-30 LAB — CBC WITH DIFFERENTIAL/PLATELET
Abs Immature Granulocytes: 0.05 10*3/uL (ref 0.00–0.07)
Basophils Absolute: 0.1 10*3/uL (ref 0.0–0.1)
Basophils Relative: 0 %
Eosinophils Absolute: 0.3 10*3/uL (ref 0.0–0.5)
Eosinophils Relative: 3 %
HCT: 42 % (ref 36.0–46.0)
Hemoglobin: 13.2 g/dL (ref 12.0–15.0)
Immature Granulocytes: 0 %
Lymphocytes Relative: 28 %
Lymphs Abs: 3.4 10*3/uL (ref 0.7–4.0)
MCH: 24.7 pg — ABNORMAL LOW (ref 26.0–34.0)
MCHC: 31.4 g/dL (ref 30.0–36.0)
MCV: 78.7 fL — ABNORMAL LOW (ref 80.0–100.0)
Monocytes Absolute: 0.7 10*3/uL (ref 0.1–1.0)
Monocytes Relative: 6 %
Neutro Abs: 7.7 10*3/uL (ref 1.7–7.7)
Neutrophils Relative %: 63 %
Platelets: 287 10*3/uL (ref 150–400)
RBC: 5.34 MIL/uL — ABNORMAL HIGH (ref 3.87–5.11)
RDW: 14.6 % (ref 11.5–15.5)
WBC: 12.2 10*3/uL — ABNORMAL HIGH (ref 4.0–10.5)
nRBC: 0 % (ref 0.0–0.2)

## 2018-08-30 LAB — LIPID PANEL
Cholesterol: 214 mg/dL — ABNORMAL HIGH (ref 0–200)
HDL: 54 mg/dL (ref >40)
LDL Cholesterol: 137 mg/dL — ABNORMAL HIGH (ref 0–99)
Total CHOL/HDL Ratio: 4 RATIO
Triglycerides: 115 mg/dL (ref <150)
VLDL: 23 mg/dL (ref 0–40)

## 2018-08-30 LAB — URINALYSIS, COMPLETE (UACMP) WITH MICROSCOPIC
Bilirubin Urine: NEGATIVE
Glucose, UA: 50 mg/dL — AB
Hgb urine dipstick: NEGATIVE
Ketones, ur: NEGATIVE mg/dL
Leukocytes,Ua: NEGATIVE
Nitrite: NEGATIVE
Protein, ur: NEGATIVE mg/dL
Specific Gravity, Urine: 1.017 (ref 1.005–1.030)
pH: 5 (ref 5.0–8.0)

## 2018-08-30 LAB — COMPREHENSIVE METABOLIC PANEL
ALT: 72 U/L — ABNORMAL HIGH (ref 0–44)
AST: 57 U/L — ABNORMAL HIGH (ref 15–41)
Albumin: 3.5 g/dL (ref 3.5–5.0)
Alkaline Phosphatase: 363 U/L — ABNORMAL HIGH (ref 38–126)
Anion gap: 9 (ref 5–15)
BUN: 30 mg/dL — ABNORMAL HIGH (ref 8–23)
CO2: 27 mmol/L (ref 22–32)
Calcium: 9 mg/dL (ref 8.9–10.3)
Chloride: 99 mmol/L (ref 98–111)
Creatinine, Ser: 1.36 mg/dL — ABNORMAL HIGH (ref 0.44–1.00)
GFR calc Af Amer: 46 mL/min — ABNORMAL LOW (ref >60)
GFR calc non Af Amer: 40 mL/min — ABNORMAL LOW (ref >60)
Glucose, Bld: 220 mg/dL — ABNORMAL HIGH (ref 70–99)
Potassium: 4 mmol/L (ref 3.5–5.1)
Sodium: 135 mmol/L (ref 135–145)
Total Bilirubin: 0.8 mg/dL (ref 0.3–1.2)
Total Protein: 8.9 g/dL — ABNORMAL HIGH (ref 6.5–8.1)

## 2018-08-30 LAB — HEMOGLOBIN A1C
Hgb A1c MFr Bld: 9.3 % — ABNORMAL HIGH (ref 4.8–5.6)
Mean Plasma Glucose: 220.21 mg/dL

## 2018-08-30 LAB — TSH: TSH: 0.617 u[IU]/mL (ref 0.350–4.500)

## 2018-08-31 LAB — VITAMIN D 25 HYDROXY (VIT D DEFICIENCY, FRACTURES): Vit D, 25-Hydroxy: 31.9 ng/mL (ref 30.0–100.0)

## 2018-09-05 DIAGNOSIS — I10 Essential (primary) hypertension: Secondary | ICD-10-CM | POA: Diagnosis not present

## 2018-09-05 DIAGNOSIS — Z79899 Other long term (current) drug therapy: Secondary | ICD-10-CM | POA: Diagnosis not present

## 2018-09-05 DIAGNOSIS — E039 Hypothyroidism, unspecified: Secondary | ICD-10-CM | POA: Diagnosis not present

## 2018-09-05 DIAGNOSIS — G894 Chronic pain syndrome: Secondary | ICD-10-CM | POA: Diagnosis not present

## 2018-09-05 DIAGNOSIS — E785 Hyperlipidemia, unspecified: Secondary | ICD-10-CM | POA: Diagnosis not present

## 2018-09-05 DIAGNOSIS — E1165 Type 2 diabetes mellitus with hyperglycemia: Secondary | ICD-10-CM | POA: Diagnosis not present

## 2018-10-11 ENCOUNTER — Inpatient Hospital Stay (HOSPITAL_COMMUNITY): Payer: Medicare Other | Attending: Hematology

## 2018-10-11 ENCOUNTER — Other Ambulatory Visit: Payer: Self-pay

## 2018-10-11 DIAGNOSIS — D472 Monoclonal gammopathy: Secondary | ICD-10-CM | POA: Diagnosis not present

## 2018-10-11 LAB — COMPREHENSIVE METABOLIC PANEL
ALT: 46 U/L — ABNORMAL HIGH (ref 0–44)
AST: 30 U/L (ref 15–41)
Albumin: 3.2 g/dL — ABNORMAL LOW (ref 3.5–5.0)
Alkaline Phosphatase: 365 U/L — ABNORMAL HIGH (ref 38–126)
Anion gap: 8 (ref 5–15)
BUN: 17 mg/dL (ref 8–23)
CO2: 22 mmol/L (ref 22–32)
Calcium: 8.9 mg/dL (ref 8.9–10.3)
Chloride: 102 mmol/L (ref 98–111)
Creatinine, Ser: 1.06 mg/dL — ABNORMAL HIGH (ref 0.44–1.00)
GFR calc Af Amer: >60 mL/min (ref >60)
GFR calc non Af Amer: 54 mL/min — ABNORMAL LOW (ref >60)
Glucose, Bld: 341 mg/dL — ABNORMAL HIGH (ref 70–99)
Potassium: 3.8 mmol/L (ref 3.5–5.1)
Sodium: 132 mmol/L — ABNORMAL LOW (ref 135–145)
Total Bilirubin: 1 mg/dL (ref 0.3–1.2)
Total Protein: 8.6 g/dL — ABNORMAL HIGH (ref 6.5–8.1)

## 2018-10-11 LAB — CBC WITH DIFFERENTIAL/PLATELET
Abs Immature Granulocytes: 0.04 10*3/uL (ref 0.00–0.07)
Basophils Absolute: 0 10*3/uL (ref 0.0–0.1)
Basophils Relative: 0 %
Eosinophils Absolute: 0.4 10*3/uL (ref 0.0–0.5)
Eosinophils Relative: 4 %
HCT: 41.1 % (ref 36.0–46.0)
Hemoglobin: 12.8 g/dL (ref 12.0–15.0)
Immature Granulocytes: 0 %
Lymphocytes Relative: 30 %
Lymphs Abs: 2.8 10*3/uL (ref 0.7–4.0)
MCH: 25.1 pg — ABNORMAL LOW (ref 26.0–34.0)
MCHC: 31.1 g/dL (ref 30.0–36.0)
MCV: 80.7 fL (ref 80.0–100.0)
Monocytes Absolute: 0.5 10*3/uL (ref 0.1–1.0)
Monocytes Relative: 6 %
Neutro Abs: 5.7 10*3/uL (ref 1.7–7.7)
Neutrophils Relative %: 60 %
Platelets: 233 10*3/uL (ref 150–400)
RBC: 5.09 MIL/uL (ref 3.87–5.11)
RDW: 13.2 % (ref 11.5–15.5)
WBC: 9.5 10*3/uL (ref 4.0–10.5)
nRBC: 0 % (ref 0.0–0.2)

## 2018-10-12 LAB — KAPPA/LAMBDA LIGHT CHAINS
Kappa free light chain: 861.2 mg/L — ABNORMAL HIGH (ref 3.3–19.4)
Kappa, lambda light chain ratio: 40.43 — ABNORMAL HIGH (ref 0.26–1.65)
Lambda free light chains: 21.3 mg/L (ref 5.7–26.3)

## 2018-10-18 ENCOUNTER — Ambulatory Visit (HOSPITAL_COMMUNITY): Payer: Medicare Other | Admitting: Hematology

## 2018-10-18 LAB — MULTIPLE MYELOMA PANEL, SERUM
Albumin SerPl Elph-Mcnc: 3.1 g/dL (ref 2.9–4.4)
Albumin/Glob SerPl: 0.7 (ref 0.7–1.7)
Alpha 1: 0.3 g/dL (ref 0.0–0.4)
Alpha2 Glob SerPl Elph-Mcnc: 0.9 g/dL (ref 0.4–1.0)
B-Globulin SerPl Elph-Mcnc: 1 g/dL (ref 0.7–1.3)
Gamma Glob SerPl Elph-Mcnc: 2.5 g/dL — ABNORMAL HIGH (ref 0.4–1.8)
Globulin, Total: 4.6 g/dL — ABNORMAL HIGH (ref 2.2–3.9)
IgA: 154 mg/dL (ref 87–352)
IgG (Immunoglobin G), Serum: 2863 mg/dL — ABNORMAL HIGH (ref 586–1602)
IgM (Immunoglobulin M), Srm: 41 mg/dL (ref 26–217)
M Protein SerPl Elph-Mcnc: 1.8 g/dL — ABNORMAL HIGH
Total Protein ELP: 7.7 g/dL (ref 6.0–8.5)

## 2018-11-02 ENCOUNTER — Ambulatory Visit: Payer: Medicare Other | Admitting: Gastroenterology

## 2018-11-08 ENCOUNTER — Encounter (HOSPITAL_COMMUNITY): Payer: Self-pay | Admitting: Hematology

## 2018-11-08 ENCOUNTER — Other Ambulatory Visit: Payer: Self-pay

## 2018-11-08 ENCOUNTER — Inpatient Hospital Stay (HOSPITAL_COMMUNITY): Payer: Medicare Other | Attending: Hematology | Admitting: Hematology

## 2018-11-08 VITALS — BP 138/72 | HR 107 | Temp 97.5°F | Resp 18 | Wt 222.2 lb

## 2018-11-08 DIAGNOSIS — D472 Monoclonal gammopathy: Secondary | ICD-10-CM

## 2018-11-08 DIAGNOSIS — Z23 Encounter for immunization: Secondary | ICD-10-CM | POA: Insufficient documentation

## 2018-11-08 DIAGNOSIS — Z0000 Encounter for general adult medical examination without abnormal findings: Secondary | ICD-10-CM | POA: Diagnosis not present

## 2018-11-08 MED ORDER — INFLUENZA VAC A&B SA ADJ QUAD 0.5 ML IM PRSY
0.5000 mL | PREFILLED_SYRINGE | Freq: Once | INTRAMUSCULAR | Status: AC
  Administered 2018-11-08: 0.5 mL via INTRAMUSCULAR
  Filled 2018-11-08: qty 0.5

## 2018-11-08 NOTE — Patient Instructions (Addendum)
[place] at [facility] Discharge Instructions  You were seen today by Dr. Delton Coombes. He went over your recent lab results. He will repeat your bone scan to re-evaluate your bones. He will see you back in 3 months for labs and follow up.   Thank you for choosing [place] at [facility] to provide your oncology and hematology care.  To afford each patient quality time with our provider, please arrive at least 15 minutes before your scheduled appointment time.   If you have a lab appointment with the [facility] please come in thru the  Main Entrance and check in at the main information desk  You need to re-schedule your appointment should you arrive 10 or more minutes late.  We strive to give you quality time with our providers, and arriving late affects you and other patients whose appointments are after yours.  Also, if you no show three or more times for appointments you may be dismissed from the clinic at the providers discretion.     Again, thank you for choosing [facility].  Our hope is that these requests will decrease the amount of time that you wait before being seen by our physicians.       _____________________________________________________________  Should you have questions after your visit to [facility], please contact our office at [phone] between the hours of 8:00 a.m. and 4:30 p.m.  Voicemails left after 4:00 p.m. will not be returned until the following business day.  For prescription refill requests, have your pharmacy contact our office and allow 72 hours.    Cancer Center Support Programs:   > Cancer Support Group  2nd Tuesday of the month 1pm-2pm, Journey Room

## 2018-11-08 NOTE — Progress Notes (Signed)
[place], [place] [address]   CLINIC:  Medical Oncology/Hematology  PCP:  [name], [place] [address] [phone]   REASON FOR VISIT: Follow-up for MGUS  CURRENT THERAPY: Observation   INTERVAL HISTORY:  Ms. Deltoro 68 y.o. female seen for follow-up of MGUS.  Denies any new onset bone pains.  Denies any fevers, night sweats or weight loss.  Chronic constipation from IBS is stable.  Numbness in the hands and feet is also stable.  Reports sleep problems at times.  Reports decreased appetite and energy levels.  Denies any ER visits or hospitalizations.    REVIEW OF SYSTEMS:  Review of Systems  Gastrointestinal: Positive for constipation and diarrhea.  Neurological: Positive for numbness.  Psychiatric/Behavioral: Positive for sleep disturbance.  All other systems reviewed and are negative.    PAST MEDICAL/SURGICAL HISTORY:  Past Medical History:  Diagnosis Date  . Anemia   . Arthritis   . Asthma   . COPD (chronic obstructive pulmonary disease) ([place])   . Deep vein thrombosis (DVT) of both lower extremities ([place]) 06/27/2015  . Diabetes mellitus   . Fibromyalgia   . GERD (gastroesophageal reflux disease)   . H/O hiatal hernia   . Hypercholesteremia   . Hypertension   . Hyperthyroidism   . IBS (irritable bowel syndrome)   . Inner ear disease   . 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 PONV (postoperative nausea and vomiting)   . Tachycardia    Past Surgical History:  Procedure Laterality Date  . ABDOMINAL HYSTERECTOMY  partial  . CARPAL TUNNEL RELEASE Right 1991  . CATARACT EXTRACTION W/PHACO Right 05/08/2013   Procedure: CATARACT EXTRACTION PHACO AND INTRAOCULAR LENS PLACEMENT (IOC);  Surgeon: Tonny Branch, MD;  Location: [facility];  Service: Ophthalmology;  Laterality: Right;  CDE 10.31  . CATARACT EXTRACTION W/PHACO Left 08/17/2013   Procedure: CATARACT EXTRACTION  PHACO AND INTRAOCULAR LENS PLACEMENT (IOC);  Surgeon: Tonny Branch, MD;  Location: [facility];  Service: Ophthalmology;  Laterality: Left;  CDE:9.03  . CHOLECYSTECTOMY  1971  . COLONOSCOPY WITH PROPOFOL N/A 01/06/2016   Dr. Gala Romney: diverticulosis   . DENTAL SURGERY    . ESOPHAGOGASTRODUODENOSCOPY (EGD) WITH PROPOFOL N/A 01/06/2016   Dr. Gala Romney: normal s/p empiric dilation   . EYE SURGERY    . MALONEY DILATION N/A 01/06/2016   Procedure: Venia Minks DILATION;  Surgeon: Daneil Dolin, MD;  Location: [facility];  Service: Endoscopy;  Laterality: N/A;  . REVERSE SHOULDER ARTHROPLASTY Right 08/06/2017   Procedure: RIGHT REVERSE SHOULDER ARTHROPLASTY;  Surgeon: Netta Cedars, MD;  Location: [facility];  Service: Orthopedics;  Laterality: Right;  . WRIST GANGLION EXCISION Left      SOCIAL HISTORY:  Social History   Socioeconomic History  . Marital status: Married    Spouse name: Not on file  . Number of children: Not on file  . Years of education: Not on file  . Highest education level: Not on file  Occupational History  . Not on file  Social Needs  . Financial resource strain: Not on file  . Food insecurity    Worry: Not on file    Inability: Not on file  . Transportation needs    Medical: Not on file    Non-medical: Not on file  Tobacco Use  . Smoking status: Former Smoker    Packs/day: 0.25    Years: 30.00    Pack years: 7.50  Types: Cigarettes    Quit date: 02/17/2014    Years since quitting: 4.7  . Smokeless tobacco: Never Used  . Tobacco comment: some day smoker  Substance and Sexual Activity  . Alcohol use: Yes    Alcohol/week: 0.0 standard drinks    Comment: occas  . Drug use: No  . Sexual activity: Not Currently    Birth control/protection: Surgical  Lifestyle  . Physical activity    Days per week: Not on file    Minutes per session: Not on file  . Stress: Not on file  Relationships  . Social Herbalist on phone: Not on file    Gets together: Not on file     Attends religious service: Not on file    Active member of club or organization: Not on file    Attends meetings of clubs or organizations: Not on file    Relationship status: Not on file  . Intimate partner violence    Fear of current or ex partner: Not on file    Emotionally abused: Not on file    Physically abused: Not on file    Forced sexual activity: Not on file  Other Topics Concern  . Not on file  Social History Narrative  . Not on file    FAMILY HISTORY:  Family History  Problem Relation Age of Onset  . Colon cancer Neg Hx     CURRENT MEDICATIONS:  Outpatient Encounter Medications as of 11/08/2018  Medication Sig  . ACCU-CHEK AVIVA PLUS test strip USE TO CHECK BLOOD SUGAR TID  . AMITIZA 8 MCG capsule TAKE 1 CAPSULE BY MOUTH TWICE DAILY WITH A MEAL  . apixaban (ELIQUIS) 5 MG TABS tablet Take 10 mg by mouth BID through Friday 5/19 evening. Take 58m by mouth BID starting 5/20 AM. (Patient taking differently: Take 5 mg by mouth 2 (two) times daily. )  . ARNUITY ELLIPTA 100 MCG/ACT AEPB Inhale 1 puff into the lungs daily.   . Ascorbic Acid (VITAMIN C) 1000 MG tablet Take 1,000 mg by mouth daily.   . cetirizine (ZYRTEC) 10 MG tablet Take 10 mg by mouth daily.  . Cholecalciferol (VITAMIN D) 2000 units tablet Take 4,000 Units by mouth daily.   . cimetidine (TAGAMET) 200 MG tablet Take 0.5 tablets (100 mg total) by mouth at bedtime.  .Marland KitchenCod Liver Oil CAPS Take 1 capsule by mouth at bedtime.   .Marland KitchenDEXILANT 60 MG capsule TAKE 1 CAPSULE BY MOUTH EVERY DAY  . DULoxetine (CYMBALTA) 30 MG capsule Take 1 capsule (30 mg total) by mouth 2 (two) times daily.  .Marland KitchenHUMALOG KWIKPEN 100 UNIT/ML KiwkPen INJECT 30 UNITS SUBCUTANEOUSLY THREE TIMES DAILY BEFORE MEALS FOR SUGAR  . levothyroxine (SYNTHROID, LEVOTHROID) 100 MCG tablet Take 100 mcg by mouth daily before breakfast.   . LYRICA 75 MG capsule Take 75 mg by mouth 2 (two) times daily.   . metolazone (ZAROXOLYN) 5 MG tablet Take 1 tablet (5 mg  total) by mouth 2 (two) times daily.  . metoprolol tartrate (LOPRESSOR) 25 MG tablet Take 1 tablet (25 mg total) by mouth 2 (two) times daily.  .Marland KitchenPOTASSIUM CHLORIDE PO Take by mouth daily. Unsure of strength  . pravastatin (PRAVACHOL) 80 MG tablet Take 1 tablet (80 mg total) by mouth at bedtime.  . torsemide (DEMADEX) 20 MG tablet Take 2 tablets (40 mg total) by mouth daily.  . traZODone (DESYREL) 50 MG tablet Take 50 mg by mouth at bedtime.  .Marland Kitchen  vitamin B-12 (CYANOCOBALAMIN) 1000 MCG tablet Take 1,000 mcg by mouth daily.   . vitamin E 200 UNIT capsule Take 200 Units by mouth daily.   . XULTOPHY 100-3.6 UNIT-MG/ML SOPN 60 Units daily.   Marland Kitchen acetaminophen (TYLENOL) 500 MG tablet Take 1,000 mg by mouth as needed.   Marland Kitchen albuterol (PROVENTIL HFA;VENTOLIN HFA) 108 (90 BASE) MCG/ACT inhaler Inhale 2 puffs into the lungs every 6 (six) hours as needed for wheezing or shortness of breath.  . benzonatate (TESSALON PERLES) 100 MG capsule Take 1-2 capsules by mouth daily as needed (cough).   . CINNAMON PO Take 2-3 capsules by mouth 2 (two) times daily as needed (high blood sugar).   . clobetasol ointment (TEMOVATE) 7.67 % Apply 1 application topically 2 (two) times daily as needed (rash).   . diclofenac sodium (VOLTAREN) 1 % GEL Apply 2 g topically 2 (two) times daily. (Patient not taking: Reported on 11/08/2018)  . Doxepin HCl 5 % CREA apply 2 grams (2 grams=2 inches) to affected area(s) 3 times daily. Takes as needed  . fluticasone (VERAMYST) 27.5 MCG/SPRAY nasal spray Place 2 sprays into the nose daily.   . hydrOXYzine (ATARAX/VISTARIL) 25 MG tablet Take 25 mg by mouth every 8 (eight) hours as needed for itching.   . methocarbamol (ROBAXIN) 500 MG tablet Take 1 tablet (500 mg total) by mouth 3 (three) times daily as needed. (Patient not taking: Reported on 11/08/2018)  . PROAIR RESPICLICK 209 (90 Base) MCG/ACT AEPB Inhale 2 puffs into the lungs as needed.   . traMADol (ULTRAM) 50 MG tablet TK 1 T PO TID FOR PAIN   . [EXPIRED] influenza vaccine adjuvanted (FLUAD) injection 0.5 mL    No facility-administered encounter medications on file as of 11/08/2018.     ALLERGIES:  Allergies  Allergen Reactions  . Tetracyclines & Related Anaphylaxis  . Banana Hives and Nausea And Vomiting  . Penicillins Rash and Other (See Comments)    Has patient had a PCN reaction causing immediate rash, facial/tongue/throat swelling, SOB or lightheadedness with hypotension: No Has patient had a PCN reaction causing severe rash involving mucus membranes or skin necrosis: No Has patient had a PCN reaction that required hospitalization No Has patient had a PCN reaction occurring within the last 10 years: No If all of the above answers are "NO", then may proceed with Cephalosporin use.      PHYSICAL EXAM:  ECOG Performance status: 1  Vitals:   11/08/18 1047  BP: 138/72  Pulse: (!) 107  Resp: 18  Temp: (!) 97.5 F (36.4 C)  SpO2: 100%   Filed Weights   11/08/18 1047  Weight: 222 lb 3.2 oz (100.8 kg)    Physical Exam Constitutional:      Appearance: She is well-developed.  Cardiovascular:     Rate and Rhythm: Normal rate and regular rhythm.     Heart sounds: Normal heart sounds.  Pulmonary:     Effort: Pulmonary effort is normal.     Breath sounds: Normal breath sounds.  Abdominal:     General: There is no distension.     Palpations: Abdomen is soft. There is no mass.  Musculoskeletal: Normal range of motion.  Skin:    General: Skin is warm and dry.  Neurological:     Mental Status: She is alert and oriented to person, place, and time.  Psychiatric:        Mood and Affect: Mood normal.        Behavior: Behavior normal.  LABORATORY DATA:  I have reviewed the labs as listed.  CBC    Component Value Date/Time   WBC 9.5 10/11/2018 1220   RBC 5.09 10/11/2018 1220   HGB 12.8 10/11/2018 1220   HCT 41.1 10/11/2018 1220   PLT 233 10/11/2018 1220   MCV 80.7 10/11/2018 1220   MCH 25.1 (L)  10/11/2018 1220   MCHC 31.1 10/11/2018 1220   RDW 13.2 10/11/2018 1220   LYMPHSABS 2.8 10/11/2018 1220   MONOABS 0.5 10/11/2018 1220   EOSABS 0.4 10/11/2018 1220   BASOSABS 0.0 10/11/2018 1220   CMP Latest Ref Rng & Units 10/11/2018 08/30/2018 07/04/2018  Glucose 70 - 99 mg/dL 341(H) 220(H) 165(H)  BUN 8 - 23 mg/dL 17 30(H) 21  Creatinine 0.44 - 1.00 mg/dL 1.06(H) 1.36(H) 1.03(H)  Sodium 135 - 145 mmol/L 132(L) 135 136  Potassium 3.5 - 5.1 mmol/L 3.8 4.0 4.0  Chloride 98 - 111 mmol/L 102 99 103  CO2 22 - 32 mmol/L 22 27 25   Calcium 8.9 - 10.3 mg/dL 8.9 9.0 9.1  Total Protein 6.5 - 8.1 g/dL 8.6(H) 8.9(H) 8.5(H)  Total Bilirubin 0.3 - 1.2 mg/dL 1.0 0.8 0.4  Alkaline Phos 38 - 126 U/L 365(H) 363(H) 328(H)  AST 15 - 41 U/L 30 57(H) 47(H)  ALT 0 - 44 U/L 46(H) 72(H) 56(H)       DIAGNOSTIC IMAGING:  I reviewed scans and discussed with her.     ASSESSMENT & PLAN:   MGUS (monoclonal gammopathy of unknown significance) 1.  IgG kappa MGUS: -Bone marrow aspiration on 09/03/2015 shows 9% plasma cells and negative skeletal survey. -History of nephrotic range proteinuria, resolved now. -Last bone survey on 12/27/2017 was negative for lytic lesions. - Blood work on 10/11/2018 showed M spike of 1.8 g.  Free light chains ratio increased to 40.4 from 19.49.  Creatinine is 1.06.  Calcium and hemoglobin were normal. - I have recommended follow-up in 3 months with repeat blood work.  We will also repeat skeletal survey.  2.  Bilateral DVT: -History of bilateral leg DVT in May 2017.  Thought to be unprovoked. -She is on Eliquis indefinitely.   Total time spent is 25 minutes with more than 50% of the time spent face-to-face discussing diagnosis, prognosis, surveillance plan, counseling and coordination of care.  Orders placed this encounter:  Orders Placed This Encounter  Procedures  . DG Bone Survey Met  . CBC with Differential/Platelet  . Comprehensive metabolic panel  . Kappa/lambda  light chains  . Protein electrophoresis, serum  . Lactate dehydrogenase      Derek Jack, MD [place] [phone]

## 2018-11-08 NOTE — Assessment & Plan Note (Signed)
1.  IgG kappa MGUS: -Bone marrow aspiration on 09/03/2015 shows 9% plasma cells and negative skeletal survey. -History of nephrotic range proteinuria, resolved now. -Last bone survey on 12/27/2017 was negative for lytic lesions. - Blood work on 10/11/2018 showed M spike of 1.8 g.  Free light chains ratio increased to 40.4 from 19.49.  Creatinine is 1.06.  Calcium and hemoglobin were normal. - I have recommended follow-up in 3 months with repeat blood work.  We will also repeat skeletal survey.  2.  Bilateral DVT: -History of bilateral leg DVT in May 2017.  Thought to be unprovoked. -She is on Eliquis indefinitely.

## 2018-12-06 ENCOUNTER — Other Ambulatory Visit (HOSPITAL_COMMUNITY)
Admission: RE | Admit: 2018-12-06 | Discharge: 2018-12-06 | Disposition: A | Discharge status: Home / Self Care | Payer: Medicare Other | Source: Ambulatory Visit | Attending: Family Medicine | Admitting: Family Medicine

## 2018-12-06 ENCOUNTER — Inpatient Hospital Stay (HOSPITAL_COMMUNITY): Admit: 2018-12-06 | Payer: Medicare Other

## 2018-12-06 DIAGNOSIS — E1165 Type 2 diabetes mellitus with hyperglycemia: Secondary | ICD-10-CM | POA: Diagnosis not present

## 2018-12-06 DIAGNOSIS — E039 Hypothyroidism, unspecified: Secondary | ICD-10-CM | POA: Diagnosis not present

## 2018-12-06 DIAGNOSIS — E785 Hyperlipidemia, unspecified: Secondary | ICD-10-CM | POA: Diagnosis not present

## 2018-12-06 LAB — LIPID PANEL
Cholesterol: 221 mg/dL — ABNORMAL HIGH (ref 0–200)
HDL: 57 mg/dL (ref >40)
LDL Cholesterol: 108 mg/dL — ABNORMAL HIGH (ref 0–99)
Total CHOL/HDL Ratio: 3.9 RATIO
Triglycerides: 282 mg/dL — ABNORMAL HIGH (ref <150)
VLDL: 56 mg/dL — ABNORMAL HIGH (ref 0–40)

## 2018-12-06 LAB — BASIC METABOLIC PANEL
Anion gap: 10 (ref 5–15)
BUN: 20 mg/dL (ref 8–23)
CO2: 26 mmol/L (ref 22–32)
Calcium: 9.3 mg/dL (ref 8.9–10.3)
Chloride: 101 mmol/L (ref 98–111)
Creatinine, Ser: 1.23 mg/dL — ABNORMAL HIGH (ref 0.44–1.00)
GFR calc Af Amer: 52 mL/min — ABNORMAL LOW (ref >60)
GFR calc non Af Amer: 45 mL/min — ABNORMAL LOW (ref >60)
Glucose, Bld: 276 mg/dL — ABNORMAL HIGH (ref 70–99)
Potassium: 3.9 mmol/L (ref 3.5–5.1)
Sodium: 137 mmol/L (ref 135–145)

## 2018-12-06 LAB — HEMOGLOBIN A1C
Hgb A1c MFr Bld: 10.9 % — ABNORMAL HIGH (ref 4.8–5.6)
Mean Plasma Glucose: 266.13 mg/dL

## 2018-12-06 LAB — TSH: TSH: 0.939 u[IU]/mL (ref 0.350–4.500)

## 2018-12-12 DIAGNOSIS — E785 Hyperlipidemia, unspecified: Secondary | ICD-10-CM | POA: Diagnosis not present

## 2018-12-12 DIAGNOSIS — Z79899 Other long term (current) drug therapy: Secondary | ICD-10-CM | POA: Diagnosis not present

## 2018-12-12 DIAGNOSIS — I10 Essential (primary) hypertension: Secondary | ICD-10-CM | POA: Diagnosis not present

## 2018-12-12 DIAGNOSIS — E1165 Type 2 diabetes mellitus with hyperglycemia: Secondary | ICD-10-CM | POA: Diagnosis not present

## 2018-12-12 DIAGNOSIS — E039 Hypothyroidism, unspecified: Secondary | ICD-10-CM | POA: Diagnosis not present

## 2018-12-12 DIAGNOSIS — E1140 Type 2 diabetes mellitus with diabetic neuropathy, unspecified: Secondary | ICD-10-CM | POA: Diagnosis not present

## 2018-12-19 ENCOUNTER — Telehealth: Payer: Self-pay | Admitting: Gastroenterology

## 2018-12-19 NOTE — Telephone Encounter (Signed)
Reviewed trend of LFTs. Most recent with persistently elevated alk phos although improved from prior. AST normalized, ALT mildly elevated. Continuing to manage as fatty liver.   Please make sure she has a repeat appt in next 1-2 months.

## 2018-12-20 ENCOUNTER — Encounter: Payer: Self-pay | Admitting: Internal Medicine

## 2018-12-20 NOTE — Telephone Encounter (Signed)
PATIENT SCHEDULED AND LETTER SENT  °

## 2018-12-22 ENCOUNTER — Other Ambulatory Visit (HOSPITAL_COMMUNITY): Payer: Self-pay | Admitting: Internal Medicine

## 2018-12-22 DIAGNOSIS — Z1231 Encounter for screening mammogram for malignant neoplasm of breast: Secondary | ICD-10-CM

## 2018-12-22 DIAGNOSIS — Z780 Asymptomatic menopausal state: Secondary | ICD-10-CM

## 2019-01-10 ENCOUNTER — Encounter: Payer: Self-pay | Admitting: Nutrition

## 2019-01-10 ENCOUNTER — Other Ambulatory Visit: Payer: Self-pay

## 2019-01-10 ENCOUNTER — Encounter: Payer: Medicare Other | Attending: Family Medicine | Admitting: Nutrition

## 2019-01-10 VITALS — Ht 67.0 in | Wt 222.6 lb

## 2019-01-10 DIAGNOSIS — E785 Hyperlipidemia, unspecified: Secondary | ICD-10-CM | POA: Diagnosis not present

## 2019-01-10 DIAGNOSIS — I10 Essential (primary) hypertension: Secondary | ICD-10-CM | POA: Diagnosis not present

## 2019-01-10 DIAGNOSIS — Z713 Dietary counseling and surveillance: Secondary | ICD-10-CM | POA: Diagnosis not present

## 2019-01-10 DIAGNOSIS — E1140 Type 2 diabetes mellitus with diabetic neuropathy, unspecified: Secondary | ICD-10-CM | POA: Insufficient documentation

## 2019-01-10 DIAGNOSIS — E1165 Type 2 diabetes mellitus with hyperglycemia: Secondary | ICD-10-CM | POA: Insufficient documentation

## 2019-01-10 NOTE — Progress Notes (Signed)
Medical Nutrition Therapy:  Appt start time: 1300 end time:  1400.   Assessment:  Primary concerns today: Diabetic Type 2, Obesity. :Lives with her daughter. She and her daughter shop and cook. Eats 2-3 meals, and snacks. Says she doesn't get hungry but snacks to keep from eating a big meal. . Xultophy 60 units a day and then Humalog 45 units with breakfast and supper and 30 units with lunch. Has Fibromyalgia and problems with her back. Walks with a cane. Willing to make changes with diet and meal planning for improved blood sugars.  Lab Results  Component Value Date   HGBA1C 10.9 (H) 12/06/2018   CMP Latest Ref Rng & Units 12/06/2018 10/11/2018 08/30/2018  Glucose 70 - 99 mg/dL 276(H) 341(H) 220(H)  BUN 8 - 23 mg/dL 20 17 30(H)  Creatinine 0.44 - 1.00 mg/dL 1.23(H) 1.06(H) 1.36(H)  Sodium 135 - 145 mmol/L 137 132(L) 135  Potassium 3.5 - 5.1 mmol/L 3.9 3.8 4.0  Chloride 98 - 111 mmol/L 101 102 99  CO2 22 - 32 mmol/L 26 22 27   Calcium 8.9 - 10.3 mg/dL 9.3 8.9 9.0  Total Protein 6.5 - 8.1 g/dL - 8.6(H) 8.9(H)  Total Bilirubin 0.3 - 1.2 mg/dL - 1.0 0.8  Alkaline Phos 38 - 126 U/L - 365(H) 363(H)  AST 15 - 41 U/L - 30 57(H)  ALT 0 - 44 U/L - 46(H) 72(H)    Lipid Panel     Component Value Date/Time   CHOL 221 (H) 12/06/2018 1204   TRIG 282 (H) 12/06/2018 1204   HDL 57 12/06/2018 1204   CHOLHDL 3.9 12/06/2018 1204   VLDL 56 (H) 12/06/2018 1204   LDLCALC 108 (H) 12/06/2018 1204    Preferred Learning Style:   No preference indicated   Learning Readiness:  Ready  Change in progress   MEDICATIONS:   DIETARY INTAKE:  24-hr recall:  B ( AM): slipped  Snk ( AM):   L ( PM): slice ham, 2 waffles and sf syrupl,  Flavored wate Snk ( PM):  D ( PM): PImento cheese sandwich, Diet peach soda,  9:30:  Baked chicken, corn on cob and creamed potatoes, flavored water Beverages: flavred water, diet sodas  Usual physical activity:  ADL  Estimated energy needs: 1200   calories 135 g carbohydrates 90 g protein 33 g fat  Progress Towards Goal(s):  In progress.   Nutritional Diagnosis:  NB-1.1 Food and nutrition-related knowledge deficit As related to DIabetes Type 2.  As evidenced by A1C 10.9%.    Intervention:  Nutrition and Diabetes education provided on My Plate, CHO counting, meal planning, portion sizes, timing of meals, avoiding snacks between meals unless having a low blood sugar, target ranges for A1C and blood sugars, signs/symptoms and treatment of hyper/hypoglycemia, monitoring blood sugars, taking medications as prescribed, benefits of exercising 30 minutes per day and prevention of complications of DM.  Marland KitchenGoals  Follow My Plate  Eat 3 meals per day at times discussed Cut out snacks Drink more water Test BS 4 times per day Keep BS log Increase walking. Get A1C down to 8%  Teaching Method Utilized:  Visual Auditory Hands on  Handouts given during visit include:  The Plate Method   Meal Plan Card  Diabetes Instructions.    Barriers to learning/adherence to lifestyle change:  none  Demonstrated degree of understanding via:  Teach Back   Monitoring/Evaluation:  Dietary intake, exercise, , and body weight in 1 month(s).  Would recommend referral to an  Endocrinologist to improve her Dm along with further diabetes education.

## 2019-01-10 NOTE — Patient Instructions (Signed)
Goals  Follow My Plate  Eat 3 meals per day at times discussed Cut out snacks Drink more water Test BS 4 times per day Keep BS log Increase walking. Get A1C down to 8%

## 2019-01-30 ENCOUNTER — Inpatient Hospital Stay (HOSPITAL_COMMUNITY): Payer: Medicare Other

## 2019-01-31 ENCOUNTER — Inpatient Hospital Stay (HOSPITAL_COMMUNITY): Payer: Medicare Other | Attending: Hematology

## 2019-01-31 ENCOUNTER — Other Ambulatory Visit: Payer: Self-pay

## 2019-01-31 ENCOUNTER — Ambulatory Visit (HOSPITAL_COMMUNITY)
Admission: RE | Admit: 2019-01-31 | Discharge: 2019-01-31 | Disposition: A | Discharge status: Home / Self Care | Payer: Medicare Other | Source: Ambulatory Visit | Attending: Hematology | Admitting: Hematology

## 2019-01-31 DIAGNOSIS — D472 Monoclonal gammopathy: Secondary | ICD-10-CM | POA: Insufficient documentation

## 2019-01-31 LAB — COMPREHENSIVE METABOLIC PANEL
ALT: 57 U/L — ABNORMAL HIGH (ref 0–44)
AST: 55 U/L — ABNORMAL HIGH (ref 15–41)
Albumin: 3.4 g/dL — ABNORMAL LOW (ref 3.5–5.0)
Alkaline Phosphatase: 323 U/L — ABNORMAL HIGH (ref 38–126)
Anion gap: 10 (ref 5–15)
BUN: 27 mg/dL — ABNORMAL HIGH (ref 8–23)
CO2: 26 mmol/L (ref 22–32)
Calcium: 9.1 mg/dL (ref 8.9–10.3)
Chloride: 102 mmol/L (ref 98–111)
Creatinine, Ser: 1.12 mg/dL — ABNORMAL HIGH (ref 0.44–1.00)
GFR calc Af Amer: 58 mL/min — ABNORMAL LOW (ref >60)
GFR calc non Af Amer: 50 mL/min — ABNORMAL LOW (ref >60)
Glucose, Bld: 117 mg/dL — ABNORMAL HIGH (ref 70–99)
Potassium: 3.6 mmol/L (ref 3.5–5.1)
Sodium: 138 mmol/L (ref 135–145)
Total Bilirubin: 0.6 mg/dL (ref 0.3–1.2)
Total Protein: 9.1 g/dL — ABNORMAL HIGH (ref 6.5–8.1)

## 2019-01-31 LAB — CBC WITH DIFFERENTIAL/PLATELET
Abs Immature Granulocytes: 0.06 10*3/uL (ref 0.00–0.07)
Basophils Absolute: 0.1 10*3/uL (ref 0.0–0.1)
Basophils Relative: 0 %
Eosinophils Absolute: 0.5 10*3/uL (ref 0.0–0.5)
Eosinophils Relative: 4 %
HCT: 43 % (ref 36.0–46.0)
Hemoglobin: 13.5 g/dL (ref 12.0–15.0)
Immature Granulocytes: 1 %
Lymphocytes Relative: 35 %
Lymphs Abs: 4.4 10*3/uL — ABNORMAL HIGH (ref 0.7–4.0)
MCH: 26 pg (ref 26.0–34.0)
MCHC: 31.4 g/dL (ref 30.0–36.0)
MCV: 82.9 fL (ref 80.0–100.0)
Monocytes Absolute: 1 10*3/uL (ref 0.1–1.0)
Monocytes Relative: 8 %
Neutro Abs: 6.4 10*3/uL (ref 1.7–7.7)
Neutrophils Relative %: 52 %
Platelets: 262 10*3/uL (ref 150–400)
RBC: 5.19 MIL/uL — ABNORMAL HIGH (ref 3.87–5.11)
RDW: 14.4 % (ref 11.5–15.5)
WBC: 12.4 10*3/uL — ABNORMAL HIGH (ref 4.0–10.5)
nRBC: 0 % (ref 0.0–0.2)

## 2019-01-31 LAB — LACTATE DEHYDROGENASE: LDH: 149 U/L (ref 98–192)

## 2019-02-01 LAB — KAPPA/LAMBDA LIGHT CHAINS
Kappa free light chain: 863.7 mg/L — ABNORMAL HIGH (ref 3.3–19.4)
Kappa, lambda light chain ratio: 42.55 — ABNORMAL HIGH (ref 0.26–1.65)
Lambda free light chains: 20.3 mg/L (ref 5.7–26.3)

## 2019-02-02 LAB — PROTEIN ELECTROPHORESIS, SERUM
A/G Ratio: 0.7 (ref 0.7–1.7)
Albumin ELP: 3.4 g/dL (ref 2.9–4.4)
Alpha-1-Globulin: 0.3 g/dL (ref 0.0–0.4)
Alpha-2-Globulin: 1 g/dL (ref 0.4–1.0)
Beta Globulin: 1.3 g/dL (ref 0.7–1.3)
Gamma Globulin: 2.6 g/dL — ABNORMAL HIGH (ref 0.4–1.8)
Globulin, Total: 5.2 g/dL — ABNORMAL HIGH (ref 2.2–3.9)
M-Spike, %: 2.1 g/dL — ABNORMAL HIGH
Total Protein ELP: 8.6 g/dL — ABNORMAL HIGH (ref 6.0–8.5)

## 2019-02-06 ENCOUNTER — Encounter (HOSPITAL_COMMUNITY): Payer: Self-pay | Admitting: Hematology

## 2019-02-06 ENCOUNTER — Inpatient Hospital Stay (HOSPITAL_BASED_OUTPATIENT_CLINIC_OR_DEPARTMENT_OTHER): Payer: Medicare Other | Admitting: Hematology

## 2019-02-06 ENCOUNTER — Other Ambulatory Visit: Payer: Self-pay

## 2019-02-06 VITALS — BP 128/75 | HR 104 | Temp 98.0°F | Resp 16 | Wt 224.7 lb

## 2019-02-06 DIAGNOSIS — D472 Monoclonal gammopathy: Secondary | ICD-10-CM | POA: Diagnosis not present

## 2019-02-06 NOTE — Patient Instructions (Signed)
[place] at [facility] Discharge Instructions  You were seen today by Dr. Delton Coombes. He went over your recent results. He will see you back in 3 months for labs and follow up.   Thank you for choosing [place] at [facility] to provide your oncology and hematology care.  To afford each patient quality time with our provider, please arrive at least 15 minutes before your scheduled appointment time.   If you have a lab appointment with the [facility] please come in thru the  Main Entrance and check in at the main information desk  You need to re-schedule your appointment should you arrive 10 or more minutes late.  We strive to give you quality time with our providers, and arriving late affects you and other patients whose appointments are after yours.  Also, if you no show three or more times for appointments you may be dismissed from the clinic at the providers discretion.     Again, thank you for choosing [facility].  Our hope is that these requests will decrease the amount of time that you wait before being seen by our physicians.       _____________________________________________________________  Should you have questions after your visit to [facility], please contact our office at [phone] between the hours of 8:00 a.m. and 4:30 p.m.  Voicemails left after 4:00 p.m. will not be returned until the following business day.  For prescription refill requests, have your pharmacy contact our office and allow 72 hours.    Cancer Center Support Programs:   > Cancer Support Group  2nd Tuesday of the month 1pm-2pm, Journey Room

## 2019-02-06 NOTE — Progress Notes (Signed)
Stephanie [name], [place] [address]   CLINIC:  Medical Oncology/Hematology  PCP:  [name], [place] [address] [phone]   REASON FOR VISIT: Follow-up for MGUS  CURRENT THERAPY: Observation   INTERVAL HISTORY:  Stephanie [name] 68 y.o. female seen for follow-up of MGUS.  Denies any new onset bone pains.  Denies fevers, night sweats or weight loss.  Appetite is 75%.  Energy levels are 50%.  Cough and shortness of breath has been stable at times.  Numbness in the hands and feet is also stable from underlying diabetes.    REVIEW OF SYSTEMS:  Review of Systems  Respiratory: Positive for cough and shortness of breath.   Gastrointestinal: Positive for constipation.  Neurological: Positive for numbness.  Psychiatric/Behavioral: Positive for sleep disturbance.  All other systems reviewed and are negative.    PAST MEDICAL/SURGICAL HISTORY:  Past Medical History:  Diagnosis Date  . Anemia   . Arthritis   . Asthma   . COPD (chronic obstructive pulmonary disease) ([place])   . Deep vein thrombosis (DVT) of both lower extremities ([place]) 06/27/2015  . Diabetes mellitus   . Fibromyalgia   . GERD (gastroesophageal reflux disease)   . H/O hiatal hernia   . Hypercholesteremia   . Hypertension   . Hyperthyroidism   . IBS (irritable bowel syndrome)   . Inner ear disease   . 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 PONV (postoperative nausea and vomiting)   . Tachycardia    Past Surgical History:  Procedure Laterality Date  . ABDOMINAL HYSTERECTOMY  partial  . CARPAL TUNNEL RELEASE Right 1991  . CATARACT EXTRACTION W/PHACO Right 05/08/2013   Procedure: CATARACT EXTRACTION PHACO AND INTRAOCULAR LENS PLACEMENT (IOC);  Surgeon: Tonny Branch, MD;  Location: [facility];  Service: Ophthalmology;  Laterality: Right;  CDE 10.31  . CATARACT EXTRACTION W/PHACO Left 08/17/2013   Procedure: CATARACT EXTRACTION  PHACO AND INTRAOCULAR LENS PLACEMENT (IOC);  Surgeon: Tonny Branch, MD;  Location: [facility];  Service: Ophthalmology;  Laterality: Left;  CDE:9.03  . CHOLECYSTECTOMY  1971  . COLONOSCOPY WITH PROPOFOL N/A 01/06/2016   Dr. Gala Romney: diverticulosis   . DENTAL SURGERY    . ESOPHAGOGASTRODUODENOSCOPY (EGD) WITH PROPOFOL N/A 01/06/2016   Dr. Gala Romney: normal s/p empiric dilation   . EYE SURGERY    . MALONEY DILATION N/A 01/06/2016   Procedure: Venia Minks DILATION;  Surgeon: Daneil Dolin, MD;  [facility];  Service: Endoscopy;  Laterality: N/A;  . REVERSE SHOULDER ARTHROPLASTY Right 08/06/2017   Procedure: RIGHT REVERSE SHOULDER ARTHROPLASTY;  Surgeon: Netta Cedars, MD;  Location: [place];  Service: Orthopedics;  Laterality: Right;  . WRIST GANGLION EXCISION Left      SOCIAL HISTORY:  Social History   Socioeconomic History  . Marital status: Married    Spouse name: Not on file  . Number of children: Not on file  . Years of education: Not on file  . Highest education level: Not on file  Occupational History  . Not on file  Tobacco Use  . Smoking status: Former Smoker    Packs/day: 0.25    Years: 30.00    Pack years: 7.50    Types: Cigarettes    Quit date: 02/17/2014    Years since quitting: 4.9  . Smokeless tobacco: Never Used  . Tobacco comment: some day smoker  Substance and Sexual Activity  . Alcohol use: Yes    Alcohol/week:  0.0 standard drinks    Comment: occas  . Drug use: No  . Sexual activity: Not Currently    Birth control/protection: Surgical  Other Topics Concern  . Not on file  Social History Narrative  . Not on file   Social Determinants of Health   Financial Resource Strain:   . Difficulty of Paying Living Expenses: Not on file  Food Insecurity:   . Worried About Charity fundraiser in the Last Year: Not on file  . Ran Out of Food in the Last Year: Not on file  Transportation Needs:   . Lack of Transportation (Medical): Not on file  . Lack of  Transportation (Non-Medical): Not on file  Physical Activity:   . Days of Exercise per Week: Not on file  . Minutes of Exercise per Session: Not on file  Stress:   . Feeling of Stress : Not on file  Social Connections:   . Frequency of Communication with Friends and Family: Not on file  . Frequency of Social Gatherings with Friends and Family: Not on file  . Attends Religious Services: Not on file  . Active Member of Clubs or Organizations: Not on file  . Attends Archivist Meetings: Not on file  . Marital Status: Not on file  Intimate Partner Violence:   . Fear of Current or Ex-Partner: Not on file  . Emotionally Abused: Not on file  . Physically Abused: Not on file  . Sexually Abused: Not on file    FAMILY HISTORY:  Family History  Problem Relation Age of Onset  . Colon cancer Neg Hx     CURRENT MEDICATIONS:  Outpatient Encounter Medications as of 02/06/2019  Medication Sig  . ACCU-CHEK AVIVA PLUS test strip USE TO CHECK BLOOD SUGAR TID  . AMITIZA 8 MCG capsule TAKE 1 CAPSULE BY MOUTH TWICE DAILY WITH A MEAL  . apixaban (ELIQUIS) 5 MG TABS tablet Take 10 mg by mouth BID through Friday 5/19 evening. Take 43m by mouth BID starting 5/20 AM. (Patient taking differently: Take 5 mg by mouth 2 (two) times daily. )  . ARNUITY ELLIPTA 100 MCG/ACT AEPB Inhale 1 puff into the lungs daily.   . Ascorbic Acid (VITAMIN C) 1000 MG tablet Take 1,000 mg by mouth daily.   . cetirizine (ZYRTEC) 10 MG tablet Take 10 mg by mouth daily.  . Cholecalciferol (VITAMIN D) 2000 units tablet Take 4,000 Units by mouth daily.   . cimetidine (TAGAMET) 200 MG tablet Take 0.5 tablets (100 mg total) by mouth at bedtime.  .Marland KitchenCINNAMON PO Take 2-3 capsules by mouth 2 (two) times daily as needed (high blood sugar).   .Marland KitchenCod Liver Oil CAPS Take 1 capsule by mouth at bedtime.   .Marland KitchenDEXILANT 60 MG capsule TAKE 1 CAPSULE BY MOUTH EVERY DAY  . Doxepin HCl 5 % CREA apply 2 grams (2 grams=2 inches) to affected  area(s) 3 times daily. Takes as needed  . DULoxetine (CYMBALTA) 30 MG capsule Take 1 capsule (30 mg total) by mouth 2 (two) times daily.  . fluticasone (VERAMYST) 27.5 MCG/SPRAY nasal spray Place 2 sprays into the nose daily.   .Marland KitchenHUMALOG KWIKPEN 100 UNIT/ML KiwkPen If blood sugar is  90-150= 45 units, 150-200= 46 units. 200-250= 47 units at breakfast and supper Lunchtime: If BS is  90-150=30 units,150- 200=31 units,200-250 units 32  . hydrOXYzine (ATARAX/VISTARIL) 25 MG tablet Take 25 mg by mouth every 8 (eight) hours as needed for itching.   .Marland Kitchen  levothyroxine (SYNTHROID, LEVOTHROID) 100 MCG tablet Take 100 mcg by mouth daily before breakfast.   . LYRICA 75 MG capsule Take 75 mg by mouth 2 (two) times daily.   . metolazone (ZAROXOLYN) 5 MG tablet Take 1 tablet (5 mg total) by mouth 2 (two) times daily.  . metoprolol tartrate (LOPRESSOR) 25 MG tablet Take 1 tablet (25 mg total) by mouth 2 (two) times daily.  Marland Kitchen POTASSIUM CHLORIDE PO Take by mouth daily. Unsure of strength  . pravastatin (PRAVACHOL) 80 MG tablet Take 1 tablet (80 mg total) by mouth at bedtime.  . torsemide (DEMADEX) 20 MG tablet Take 2 tablets (40 mg total) by mouth daily.  . traZODone (DESYREL) 50 MG tablet Take 50 mg by mouth at bedtime.  . vitamin B-12 (CYANOCOBALAMIN) 1000 MCG tablet Take 1,000 mcg by mouth daily.   . vitamin E 200 UNIT capsule Take 200 Units by mouth daily.   . XULTOPHY 100-3.6 UNIT-MG/ML SOPN 60 Units daily.   Marland Kitchen acetaminophen (TYLENOL) 500 MG tablet Take 1,000 mg by mouth as needed.   Marland Kitchen albuterol (PROVENTIL HFA;VENTOLIN HFA) 108 (90 BASE) MCG/ACT inhaler Inhale 2 puffs into the lungs every 6 (six) hours as needed for wheezing or shortness of breath.  . benzonatate (TESSALON PERLES) 100 MG capsule Take 1-2 capsules by mouth daily as needed (cough).   . clobetasol ointment (TEMOVATE) 4.12 % Apply 1 application topically 2 (two) times daily as needed (rash).   . diclofenac sodium (VOLTAREN) 1 % GEL Apply 2 g  topically 2 (two) times daily. (Patient not taking: Reported on 02/06/2019)  . methocarbamol (ROBAXIN) 500 MG tablet Take 1 tablet (500 mg total) by mouth 3 (three) times daily as needed. (Patient not taking: Reported on 11/08/2018)  . PROAIR RESPICLICK 878 (90 Base) MCG/ACT AEPB Inhale 2 puffs into the lungs as needed.   . traMADol (ULTRAM) 50 MG tablet TK 1 T PO TID FOR PAIN   No facility-administered encounter medications on file as of 02/06/2019.    ALLERGIES:  Allergies  Allergen Reactions  . Tetracyclines & Related Anaphylaxis  . Banana Hives and Nausea And Vomiting  . Penicillins Rash and Other (See Comments)    Has patient had a PCN reaction causing immediate rash, facial/tongue/throat swelling, SOB or lightheadedness with hypotension: No Has patient had a PCN reaction causing severe rash involving mucus membranes or skin necrosis: No Has patient had a PCN reaction that required hospitalization No Has patient had a PCN reaction occurring within the last 10 years: No If all of the above answers are "NO", then may proceed with Cephalosporin use.      PHYSICAL EXAM:  ECOG Performance status: 1  Vitals:   02/06/19 1112  BP: 128/75  Pulse: (!) 104  Resp: 16  Temp: 98 F (36.7 C)  SpO2: 100%   Filed Weights   02/06/19 1112  Weight: 224 lb 11.2 oz (101.9 kg)    Physical Exam Constitutional:      Appearance: She is well-developed.  Cardiovascular:     Rate and Rhythm: Normal rate and regular rhythm.     Heart sounds: Normal heart sounds.  Pulmonary:     Effort: Pulmonary effort is normal.     Breath sounds: Normal breath sounds.  Abdominal:     General: There is no distension.     Palpations: Abdomen is soft. There is no mass.  Musculoskeletal:        General: Normal range of motion.  Skin:    General:  Skin is warm and dry.  Neurological:     Mental Status: She is alert and oriented to person, place, and time.  Psychiatric:        Mood and Affect: Mood  normal.        Behavior: Behavior normal.      LABORATORY DATA:  I have reviewed the labs as listed.  CBC    Component Value Date/Time   WBC 12.4 (H) 01/31/2019 1245   RBC 5.19 (H) 01/31/2019 1245   HGB 13.5 01/31/2019 1245   HCT 43.0 01/31/2019 1245   PLT 262 01/31/2019 1245   MCV 82.9 01/31/2019 1245   MCH 26.0 01/31/2019 1245   MCHC 31.4 01/31/2019 1245   RDW 14.4 01/31/2019 1245   LYMPHSABS 4.4 (H) 01/31/2019 1245   MONOABS 1.0 01/31/2019 1245   EOSABS 0.5 01/31/2019 1245   BASOSABS 0.1 01/31/2019 1245   CMP Latest Ref Rng & Units 01/31/2019 12/06/2018 10/11/2018  Glucose 70 - 99 mg/dL 117(H) 276(H) 341(H)  BUN 8 - 23 mg/dL 27(H) 20 17  Creatinine 0.44 - 1.00 mg/dL 1.12(H) 1.23(H) 1.06(H)  Sodium 135 - 145 mmol/L 138 137 132(L)  Potassium 3.5 - 5.1 mmol/L 3.6 3.9 3.8  Chloride 98 - 111 mmol/L 102 101 102  CO2 22 - 32 mmol/L _0 Calcium 8.9 - 10.3 mg/dL 9.1 9.3 8.9  Total Protein 6.5 - 8.1 g/dL 9.1(H) - 8.6(H)  Total Bilirubin 0.3 - 1.2 mg/dL 0.6 - 1.0  Alkaline Phos 38 - 126 U/L 323(H) - 365(H)  AST 15 - 41 U/L 55(H) - 30  ALT 0 - 44 U/L 57(H) - 46(H)       DIAGNOSTIC IMAGING:  I reviewed scans and discussed with her.     ASSESSMENT & PLAN:   MGUS (monoclonal gammopathy of unknown significance) 1.  IgG kappa MGUS: -Bone marrow aspiration on 09/03/2015 shows 9% plasma cells and negative skeletal survey. -History of nephrotic range proteinuria, resolved now. -We reviewed blood work from 01/31/2019.  M spike has increased to 2.1 g from 1.8 g on 10/11/2018. -Kappa light chains increased to 863 from 861.  Free light chain ratio has increased to 42.5 from 40.4. -LDH is 149.  Creatinine is 1.12. -We reviewed skeletal survey from 01/31/2019 which did not show any lytic lesions. -Because of the increase in M spike, I have recommended repeating blood work in 3 months.  2.  Elevated LFTs: -Her AST was 55, ALT was 57 with alk phos of 323. -An ultrasound done  earlier this year showed fatty liver. -Weight loss to improve LFTs was recommended.  3.  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 -She is on Eliquis indefinitely.  She is tolerating it very well without bleeding complications.  Orders placed this encounter:  Orders Placed This Encounter  Procedures  . CBC with Differential/Platelet  . Comprehensive metabolic panel  . Protein electrophoresis, serum  . Kappa/lambda light chains  . Lactate dehydrogenase      Derek Jack, MD [place] [phone]

## 2019-02-06 NOTE — Assessment & Plan Note (Signed)
1.  IgG kappa MGUS: -Bone marrow aspiration on 09/03/2015 shows 9% plasma cells and negative skeletal survey. -History of nephrotic range proteinuria, resolved now. -We reviewed blood work from 01/31/2019.  M spike has increased to 2.1 g from 1.8 g on 10/11/2018. -Kappa light chains increased to 863 from 861.  Free light chain ratio has increased to 42.5 from 40.4. -LDH is 149.  Creatinine is 1.12. -We reviewed skeletal survey from 01/31/2019 which did not show any lytic lesions. -Because of the increase in M spike, I have recommended repeating blood work in 3 months.  2.  Elevated LFTs: -Her AST was 55, ALT was 57 with alk phos of 323. -An ultrasound done earlier this year showed fatty liver. -Weight loss to improve LFTs was recommended.  3.  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 -She is on Eliquis indefinitely.  She is tolerating it very well without bleeding complications.

## 2019-02-16 ENCOUNTER — Ambulatory Visit (INDEPENDENT_AMBULATORY_CARE_PROVIDER_SITE_OTHER): Payer: Medicare Other | Admitting: Gastroenterology

## 2019-02-16 ENCOUNTER — Other Ambulatory Visit: Payer: Self-pay

## 2019-02-16 ENCOUNTER — Encounter: Payer: Self-pay | Admitting: Gastroenterology

## 2019-02-16 VITALS — BP 149/88 | HR 111 | Temp 96.6°F | Ht 68.0 in | Wt 221.8 lb

## 2019-02-16 DIAGNOSIS — R945 Abnormal results of liver function studies: Secondary | ICD-10-CM

## 2019-02-16 DIAGNOSIS — R1310 Dysphagia, unspecified: Secondary | ICD-10-CM

## 2019-02-16 DIAGNOSIS — R7989 Other specified abnormal findings of blood chemistry: Secondary | ICD-10-CM

## 2019-02-16 MED ORDER — PANTOPRAZOLE SODIUM 40 MG PO TBEC: 40.0000 mg | DELAYED_RELEASE_TABLET | Freq: Two times a day (BID) | ORAL | 3 refills | Status: DC

## 2019-02-16 NOTE — Progress Notes (Signed)
Primary Care Physician:  [name], FNP Primary GI: Dr. Gala Romney   Chief Complaint  Patient presents with  . Dysphagia    at times    HPI:   Stephanie [name] is a 68 y.o. female presenting today with a history of GERD, constipation, elevated mixed pattern LFTs with thorough evaluation of in setting of fatty liver with AMA, ANA positive, ASMA moderately positive. Negative Hep B, C. Hemochromatosis negative. Followed by oncology with history of nephrotic syndrome, IgG kappa monoclonal gammopathy, observed for now.Liver biopsy 2017 with mild chronic hepatitis, mild fatty liver, non significant fibrosis, non-specific findings and likely secondary to drug-induced etiology.She has a history of nonspecific esophageal motility disorder on barium pill esophagram January 2019. US abdomen complete in March 2020 with normal spleen size, fatty liver.   Last EGD 2017 with empiric dilation and improvement of dysphagia. Has noted occasional dysphagia, 2-4 times per month with any food. Happens with solid food and liquids. Pills without any issue for most part unless taking cinnamon. Wants to hold off on EGD. Woke up at night coughing and had to clear phlegm. Dexilant once daily but doesn't feel like this is helping as much. Has started to take Tagamet and then sometimes baking soda. Not drinking any sodas. Velva.   Constipation: Amitiza 8 mcg po just takes as needed. Averaging once to twice a week.   Most recent LFTs reviewed: alk phos elevated at 323, AST 55, ALT 57.    Past Medical History:  Diagnosis Date  . Anemia   . Arthritis   . Asthma   . COPD (chronic obstructive pulmonary disease) ([place])   . Deep vein thrombosis (DVT) of both lower extremities ([place]) 06/27/2015  . Diabetes mellitus   . Fibromyalgia   . GERD (gastroesophageal reflux disease)   . H/O hiatal hernia   . Hypercholesteremia   . Hypertension   . Hyperthyroidism   . IBS (irritable bowel syndrome)   . Inner ear disease    . 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 PONV (postoperative nausea and vomiting)   . Tachycardia     Past Surgical History:  Procedure Laterality Date  . ABDOMINAL HYSTERECTOMY  partial  . CARPAL TUNNEL RELEASE Right 1991  . CATARACT EXTRACTION W/PHACO Right 05/08/2013   Procedure: CATARACT EXTRACTION PHACO AND INTRAOCULAR LENS PLACEMENT (IOC);  Surgeon: Tonny Branch, MD;  Location: [facility];  Service: Ophthalmology;  Laterality: Right;  CDE 10.31  . CATARACT EXTRACTION W/PHACO Left 08/17/2013   Procedure: CATARACT EXTRACTION PHACO AND INTRAOCULAR LENS PLACEMENT (IOC);  Surgeon: Tonny Branch, MD;  Location: [facility];  Service: Ophthalmology;  Laterality: Left;  CDE:9.03  . CHOLECYSTECTOMY  1971  . COLONOSCOPY WITH PROPOFOL N/A 01/06/2016   Dr. Gala Romney: diverticulosis   . DENTAL SURGERY    . ESOPHAGOGASTRODUODENOSCOPY (EGD) WITH PROPOFOL N/A 01/06/2016   Dr. Gala Romney: normal s/p empiric dilation   . EYE SURGERY    . MALONEY DILATION N/A 01/06/2016   Procedure: Venia Minks DILATION;  Surgeon: Daneil Dolin, MD;  [facility];  Service: Endoscopy;  Laterality: N/A;  . REVERSE SHOULDER ARTHROPLASTY Right 08/06/2017   Procedure: RIGHT REVERSE SHOULDER ARTHROPLASTY;  Surgeon: Netta Cedars, MD;  Location: [place];  Service: Orthopedics;  Laterality: Right;  . WRIST GANGLION EXCISION Left     Current Outpatient Medications  Medication Sig Dispense Refill  . ACCU-CHEK AVIVA PLUS test strip USE TO CHECK BLOOD SUGAR TID  11  . acetaminophen (TYLENOL) 500 MG  tablet Take 1,000 mg by mouth as needed.     . albuterol (PROVENTIL HFA;VENTOLIN HFA) 108 (90 BASE) MCG/ACT inhaler Inhale 2 puffs into the lungs every 6 (six) hours as needed for wheezing or shortness of breath.    . AMITIZA 8 MCG capsule TAKE 1 CAPSULE BY MOUTH TWICE DAILY WITH A MEAL 60 capsule 11  . apixaban (ELIQUIS) 5 MG TABS tablet Take 10 mg by mouth BID through Friday 5/19 evening. Take 5mg by mouth BID starting  5/20 AM. (Patient taking differently: Take 5 mg by mouth 2 (two) times daily. ) 60 tablet 3  . ARNUITY ELLIPTA 100 MCG/ACT AEPB Inhale 1 puff into the lungs daily.   11  . Ascorbic Acid (VITAMIN C) 1000 MG tablet Take 1,000 mg by mouth daily.     . benzonatate (TESSALON PERLES) 100 MG capsule Take 1-2 capsules by mouth daily as needed (cough).     . cetirizine (ZYRTEC) 10 MG tablet Take 10 mg by mouth daily.    . Cholecalciferol (VITAMIN D) 2000 units tablet Take 4,000 Units by mouth daily.     . cimetidine (TAGAMET) 200 MG tablet Take 0.5 tablets (100 mg total) by mouth at bedtime. 30 tablet 2  . CINNAMON PO Take 2-3 capsules by mouth 2 (two) times daily as needed (high blood sugar).     . clobetasol ointment (TEMOVATE) 0.05 % Apply 1 application topically 2 (two) times daily as needed (rash).     . Cod Liver Oil CAPS Take 1 capsule by mouth at bedtime.     . DEXILANT 60 MG capsule TAKE 1 CAPSULE BY MOUTH EVERY DAY 30 capsule 5  . diclofenac sodium (VOLTAREN) 1 % GEL Apply 2 g topically 2 (two) times daily. 1 Tube 0  . Doxepin HCl 5 % CREA apply 2 grams (2 grams=2 inches) to affected area(s) 3 times daily. Takes as needed  5  . DULoxetine (CYMBALTA) 30 MG capsule Take 1 capsule (30 mg total) by mouth 2 (two) times daily. 30 capsule -0  . fluticasone (VERAMYST) 27.5 MCG/SPRAY nasal spray Place 2 sprays into the nose daily.     . HUMALOG KWIKPEN 100 UNIT/ML KiwkPen If blood sugar is  90-150= 45 units, 150-200= 46 units. 200-250= 47 units at breakfast and supper Lunchtime: If BS is  90-150=30 units,150- 200=31 units,200-250 units 32  5  . hydrOXYzine (ATARAX/VISTARIL) 25 MG tablet Take 25 mg by mouth every 8 (eight) hours as needed for itching.     . levothyroxine (SYNTHROID, LEVOTHROID) 100 MCG tablet Take 100 mcg by mouth daily before breakfast.   0  . LYRICA 75 MG capsule Take 75 mg by mouth 2 (two) times daily.     . methocarbamol (ROBAXIN) 500 MG tablet Take 1 tablet (500 mg total) by mouth 3  (three) times daily as needed. 60 tablet 1  . metolazone (ZAROXOLYN) 5 MG tablet Take 1 tablet (5 mg total) by mouth 2 (two) times daily. 60 tablet 0  . metoprolol tartrate (LOPRESSOR) 25 MG tablet Take 1 tablet (25 mg total) by mouth 2 (two) times daily. 60 tablet 0  . POTASSIUM CHLORIDE PO Take by mouth daily. Unsure of strength    . pravastatin (PRAVACHOL) 80 MG tablet Take 1 tablet (80 mg total) by mouth at bedtime. 30 tablet 0  . PROAIR RESPICLICK 108 (90 Base) MCG/ACT AEPB Inhale 2 puffs into the lungs as needed.     . torsemide (DEMADEX) 20 MG tablet Take 2 tablets (  40 mg total) by mouth daily. 60 tablet 0  . traMADol (ULTRAM) 50 MG tablet TK 1 T PO TID FOR PAIN  1  . traZODone (DESYREL) 50 MG tablet Take 50 mg by mouth at bedtime.    . vitamin B-12 (CYANOCOBALAMIN) 1000 MCG tablet Take 1,000 mcg by mouth daily.     . vitamin E 200 UNIT capsule Take 200 Units by mouth daily.     . XULTOPHY 100-3.6 UNIT-MG/ML SOPN 60 Units daily.   0   No current facility-administered medications for this visit.    Allergies as of 02/16/2019 - Review Complete 02/16/2019  Allergen Reaction Noted  . Tetracyclines & related Anaphylaxis 09/19/2010  . Banana Hives and Nausea And Vomiting 05/04/2013  . Penicillins Rash and Other (See Comments) 09/19/2010    Family History  Problem Relation Age of Onset  . Colon cancer Neg Hx     Social History   Socioeconomic History  . Marital status: Married    Spouse name: Not on file  . Number of children: Not on file  . Years of education: Not on file  . Highest education level: Not on file  Occupational History  . Not on file  Tobacco Use  . Smoking status: Former Smoker    Packs/day: 0.25    Years: 30.00    Pack years: 7.50    Types: Cigarettes    Quit date: 02/17/2014    Years since quitting: 5.0  . Smokeless tobacco: Never Used  . Tobacco comment: some day smoker  Substance and Sexual Activity  . Alcohol use: Yes    Alcohol/week: 0.0 standard  drinks    Comment: occas  . Drug use: No  . Sexual activity: Not Currently    Birth control/protection: Surgical  Other Topics Concern  . Not on file  Social History Narrative  . Not on file   Social Determinants of Health   Financial Resource Strain:   . Difficulty of Paying Living Expenses: Not on file  Food Insecurity:   . Worried About Running Out of Food in the Last Year: Not on file  . Ran Out of Food in the Last Year: Not on file  Transportation Needs:   . Lack of Transportation (Medical): Not on file  . Lack of Transportation (Non-Medical): Not on file  Physical Activity:   . Days of Exercise per Week: Not on file  . Minutes of Exercise per Session: Not on file  Stress:   . Feeling of Stress : Not on file  Social Connections:   . Frequency of Communication with Friends and Family: Not on file  . Frequency of Social Gatherings with Friends and Family: Not on file  . Attends Religious Services: Not on file  . Active Member of Clubs or Organizations: Not on file  . Attends Club or Organization Meetings: Not on file  . Marital Status: Not on file    Review of Systems: Gen: Denies fever, chills, anorexia. Denies fatigue, weakness, weight loss.  CV: Denies chest pain, palpitations, syncope, peripheral edema, and claudication. Resp: Denies dyspnea at rest, cough, wheezing, coughing up blood, and pleurisy. GI: see HPI Derm: Denies rash, itching, dry skin Psych: Denies depression, anxiety, memory loss, confusion. No homicidal or suicidal ideation.  Heme: Denies bruising, bleeding, and enlarged lymph nodes.  Physical Exam: BP (!) 149/88   Pulse (!) 111   Temp (!) 96.6 F (35.9 C) (Temporal)   Ht 5' 8" (1.727 m)   Wt 221 lb 12.8   oz (100.6 kg)   BMI 33.72 kg/m  General:   Alert and oriented. No distress noted. Pleasant and cooperative.  Head:  Normocephalic and atraumatic. Eyes:  Conjuctiva clear without scleral icterus. Abdomen:  +BS, soft, non-tender and  non-distended. No rebound or guarding. No HSM or masses noted. Msk:  Symmetrical without gross deformities. Normal posture. Extremities:  Without edema. Neurologic:  Alert and  oriented x4 Psych:  Alert and cooperative. Normal mood and affect.  ASSESSMENT: Stephanie [name] is a 68 y.o. female presenting today with history of mixed pattern elevated LFTs in setting of fatty liver and evaluation as noted above, no stigmata of portal hypertension, liver biopsy on file from 2017, presenting for routine follow-up. GERD is not ideally controlled at this time on Dexilant. Constipation is rare and using Amitiza novel dosing of just prn.  Noting intermittent solid and liquid dysphagia but wanting to hold off on EGD; it must be noted that she had improvement with empiric dilation in 2017. Will change Dexilant to Protonix and increase to BID. GERD diet provided. Non-specific esophageal motility disorder likely playing a role. She is to call when she would like to pursue EGD.     PLAN:  Continue to follow LFTs serially. Consider repeat liver biopsy if trending upwards Stop Dexilant Trial of Protonix BID GERD diet provided EGD with dilation recommended when patient is willing Return in 4 months    W. , PhD, ANP-BC [facility]    

## 2019-02-16 NOTE — Progress Notes (Signed)
Cc'ed to pcp °

## 2019-02-16 NOTE — Patient Instructions (Addendum)
Let's stop Dexilant and try Protonix twice a day, 30 minutes before breakfast and dinner. Let me know how this works for reflux control.  Make sure not to eat at least 2-3 hours before laying down at night to avoid nighttime flares.   Continue with healthy diet choices and strict control of blood sugars. This is helpful for your liver.   We will see you in 4 months or sooner if needed! Call if worsening swallow issues!  Have a wonderful New Year!  I enjoyed seeing you again today! As you know, I value our relationship and want to provide genuine, compassionate, and quality care. I welcome your feedback. If you receive a survey regarding your visit,  I greatly appreciate you taking time to fill this out. See you next time!  Annitta Needs, PhD, ANP-BC [facility]   Food Choices for Gastroesophageal Reflux Disease, Adult When you have gastroesophageal reflux disease (GERD), the foods you eat and your eating habits are very important. Choosing the right foods can help ease the discomfort of GERD. Consider working with a diet and nutrition specialist (dietitian) to help you make healthy food choices. What general guidelines should I follow?  Eating plan  Choose healthy foods low in fat, such as fruits, vegetables, whole grains, low-fat dairy products, and lean meat, fish, and poultry.  Eat frequent, small meals instead of three large meals each day. Eat your meals slowly, in a relaxed setting. Avoid bending over or lying down until 2-3 hours after eating.  Limit high-fat foods such as fatty meats or fried foods.  Limit your intake of oils, butter, and shortening to less than 8 teaspoons each day.  Avoid the following: ? Foods that cause symptoms. These may be different for different people. Keep a food diary to keep track of foods that cause symptoms. ? Alcohol. ? Drinking large amounts of liquid with meals. ? Eating meals during the 2-3 hours before bed.  Cook foods  using methods other than frying. This may include baking, grilling, or broiling. Lifestyle  Maintain a healthy weight. Ask your health care provider what weight is healthy for you. If you need to lose weight, work with your health care provider to do so safely.  Exercise for at least 30 minutes on 5 or more days each week, or as told by your health care provider.  Avoid wearing clothes that fit tightly around your waist and chest.  Do not use any products that contain nicotine or tobacco, such as cigarettes and e-cigarettes. If you need help quitting, ask your health care provider.  Sleep with the head of your bed raised. Use a wedge under the mattress or blocks under the bed frame to raise the head of the bed. What foods are not recommended? The items listed may not be a complete list. Talk with your dietitian about what dietary choices are best for you. Grains Pastries or quick breads with added fat. [place] toast. Vegetables Deep fried vegetables. [place] fries. Any vegetables prepared with added fat. Any vegetables that cause symptoms. For some people this may include tomatoes and tomato products, chili peppers, onions and garlic, and horseradish. Fruits Any fruits prepared with added fat. Any fruits that cause symptoms. For some people this may include citrus fruits, such as oranges, grapefruit, pineapple, and lemons. Meats and other protein foods High-fat meats, such as fatty beef or pork, hot dogs, ribs, ham, sausage, salami and bacon. Fried meat or protein, including fried fish and fried chicken. Nuts  and nut butters. Dairy Whole milk and chocolate milk. Sour cream. Cream. Ice cream. Cream cheese. Milk shakes. Beverages Coffee and tea, with or without caffeine. Carbonated beverages. Sodas. Energy drinks. Fruit juice made with acidic fruits (such as orange or grapefruit). Tomato juice. Alcoholic drinks. Fats and oils Butter. Margarine. Shortening. Ghee. Sweets and desserts Chocolate  and cocoa. Donuts. Seasoning and other foods Pepper. Peppermint and spearmint. Any condiments, herbs, or seasonings that cause symptoms. For some people, this may include curry, hot sauce, or vinegar-based salad dressings. Summary  When you have gastroesophageal reflux disease (GERD), food and lifestyle choices are very important to help ease the discomfort of GERD.  Eat frequent, small meals instead of three large meals each day. Eat your meals slowly, in a relaxed setting. Avoid bending over or lying down until 2-3 hours after eating.  Limit high-fat foods such as fatty meat or fried foods. This information is not intended to replace advice given to you by your health care provider. Make sure you discuss any questions you have with your health care provider. Document Revised: 05/26/2018 Document Reviewed: 02/04/2016 Elsevier Patient Education  [place].

## 2019-03-06 ENCOUNTER — Other Ambulatory Visit (HOSPITAL_COMMUNITY)
Admission: RE | Admit: 2019-03-06 | Discharge: 2019-03-06 | Disposition: A | Discharge status: Home / Self Care | Payer: Medicare Other | Source: Ambulatory Visit | Attending: Family Medicine | Admitting: Family Medicine

## 2019-03-06 DIAGNOSIS — E039 Hypothyroidism, unspecified: Secondary | ICD-10-CM | POA: Insufficient documentation

## 2019-03-06 DIAGNOSIS — E1165 Type 2 diabetes mellitus with hyperglycemia: Secondary | ICD-10-CM | POA: Insufficient documentation

## 2019-03-06 DIAGNOSIS — I10 Essential (primary) hypertension: Secondary | ICD-10-CM | POA: Diagnosis not present

## 2019-03-06 DIAGNOSIS — E785 Hyperlipidemia, unspecified: Secondary | ICD-10-CM | POA: Diagnosis not present

## 2019-03-06 LAB — COMPREHENSIVE METABOLIC PANEL
ALT: 55 U/L — ABNORMAL HIGH (ref 0–44)
AST: 40 U/L (ref 15–41)
Albumin: 3.4 g/dL — ABNORMAL LOW (ref 3.5–5.0)
Alkaline Phosphatase: 342 U/L — ABNORMAL HIGH (ref 38–126)
Anion gap: 6 (ref 5–15)
BUN: 31 mg/dL — ABNORMAL HIGH (ref 8–23)
CO2: 26 mmol/L (ref 22–32)
Calcium: 9.2 mg/dL (ref 8.9–10.3)
Chloride: 106 mmol/L (ref 98–111)
Creatinine, Ser: 1.15 mg/dL — ABNORMAL HIGH (ref 0.44–1.00)
GFR calc Af Amer: 57 mL/min — ABNORMAL LOW (ref >60)
GFR calc non Af Amer: 49 mL/min — ABNORMAL LOW (ref >60)
Glucose, Bld: 59 mg/dL — ABNORMAL LOW (ref 70–99)
Potassium: 3.7 mmol/L (ref 3.5–5.1)
Sodium: 138 mmol/L (ref 135–145)
Total Bilirubin: 0.6 mg/dL (ref 0.3–1.2)
Total Protein: 8.9 g/dL — ABNORMAL HIGH (ref 6.5–8.1)

## 2019-03-06 LAB — LIPID PANEL
Cholesterol: 190 mg/dL (ref 0–200)
HDL: 52 mg/dL (ref >40)
LDL Cholesterol: 119 mg/dL — ABNORMAL HIGH (ref 0–99)
Total CHOL/HDL Ratio: 3.7 RATIO
Triglycerides: 93 mg/dL (ref <150)
VLDL: 19 mg/dL (ref 0–40)

## 2019-03-06 LAB — HEMOGLOBIN A1C
Hgb A1c MFr Bld: 7.5 % — ABNORMAL HIGH (ref 4.8–5.6)
Mean Plasma Glucose: 168.55 mg/dL

## 2019-03-06 LAB — TSH: TSH: 0.133 u[IU]/mL — ABNORMAL LOW (ref 0.350–4.500)

## 2019-03-13 DIAGNOSIS — E039 Hypothyroidism, unspecified: Secondary | ICD-10-CM | POA: Diagnosis not present

## 2019-03-13 DIAGNOSIS — E785 Hyperlipidemia, unspecified: Secondary | ICD-10-CM | POA: Diagnosis not present

## 2019-03-13 DIAGNOSIS — G894 Chronic pain syndrome: Secondary | ICD-10-CM | POA: Diagnosis not present

## 2019-03-13 DIAGNOSIS — I10 Essential (primary) hypertension: Secondary | ICD-10-CM | POA: Diagnosis not present

## 2019-03-13 DIAGNOSIS — E1140 Type 2 diabetes mellitus with diabetic neuropathy, unspecified: Secondary | ICD-10-CM | POA: Diagnosis not present

## 2019-03-14 ENCOUNTER — Encounter: Payer: Medicare Other | Attending: Family Medicine | Admitting: Nutrition

## 2019-03-14 ENCOUNTER — Encounter: Payer: Self-pay | Admitting: Nutrition

## 2019-03-14 ENCOUNTER — Other Ambulatory Visit: Payer: Self-pay

## 2019-03-14 VITALS — Wt 221.0 lb

## 2019-03-14 DIAGNOSIS — E118 Type 2 diabetes mellitus with unspecified complications: Secondary | ICD-10-CM | POA: Diagnosis not present

## 2019-03-14 DIAGNOSIS — I10 Essential (primary) hypertension: Secondary | ICD-10-CM

## 2019-03-14 DIAGNOSIS — E669 Obesity, unspecified: Secondary | ICD-10-CM | POA: Diagnosis present

## 2019-03-14 DIAGNOSIS — E1165 Type 2 diabetes mellitus with hyperglycemia: Secondary | ICD-10-CM | POA: Insufficient documentation

## 2019-03-14 DIAGNOSIS — IMO0002 Reserved for concepts with insufficient information to code with codable children: Secondary | ICD-10-CM

## 2019-03-14 NOTE — Patient Instructions (Signed)
Goals  Keep up the good job! Increase vegetables with lunch and dinner Keep drinking water Eat 30-45 grams of CHO per meal. Lose 2-3 lbs per month. Increase physical activity as tolerated. Test BS 4 times per day. Get A1C down to 6.5% or less.

## 2019-03-14 NOTE — Progress Notes (Signed)
  Medical Nutrition Therapy:  Appt start time: 9563 end time:  1400.  Assessment:  Primary concerns today: Diabetic Type 2, Obesity. :Lives with her daughter. She and her daughter shop and cook. Follow up : drinking more water, eating meals on time.. Eating more fresh fruits and vegetables. A1C down to 7.5% from > 10%. Xultophy 60 units a day and then Humalog 45 units with breakfast and supper and 30 units with lunch.  Has Fibromyalgia and problems with her back. Walks with a cane. Willing to make changes with diet and meal planning for improved blood sugars.  Lab Results  Component Value Date   HGBA1C 7.5 (H) 03/06/2019   CMP Latest Ref Rng & Units 03/06/2019 01/31/2019 12/06/2018  Glucose 70 - 99 mg/dL 59(L) 117(H) 276(H)  BUN 8 - 23 mg/dL 31(H) 27(H) 20  Creatinine 0.44 - 1.00 mg/dL 1.15(H) 1.12(H) 1.23(H)  Sodium 135 - 145 mmol/L 138 138 137  Potassium 3.5 - 5.1 mmol/L 3.7 3.6 3.9  Chloride 98 - 111 mmol/L 106 102 101  CO2 22 - 32 mmol/L 26 26 26   Calcium 8.9 - 10.3 mg/dL 9.2 9.1 9.3  Total Protein 6.5 - 8.1 g/dL 8.9(H) 9.1(H) -  Total Bilirubin 0.3 - 1.2 mg/dL 0.6 0.6 -  Alkaline Phos 38 - 126 U/L 342(H) 323(H) -  AST 15 - 41 U/L 40 55(H) -  ALT 0 - 44 U/L 55(H) 57(H) -    Lipid Panel     Component Value Date/Time   CHOL 190 03/06/2019 1224   TRIG 93 03/06/2019 1224   HDL 52 03/06/2019 1224   CHOLHDL 3.7 03/06/2019 1224   VLDL 19 03/06/2019 1224   LDLCALC 119 (H) 03/06/2019 1224    Preferred Learning Style:   No preference indicated   Learning Readiness:  Ready  Change in progress   MEDICATIONS:   DIETARY INTAKE:  24-hr recall:  B ( AM): Oatmeal and sausage patty. Snk ( AM):   L ( PM): salad or  Ham cheese sandwich water Snk ( PM):  D ( PM): Healthy Choice soup 1/2 c,raw carrots    Usual physical activity:  ADL  Estimated energy needs: 1200  calories 135 g carbohydrates 90 g protein 33 g fat  Progress Towards Goal(s):  In  progress.   Nutritional Diagnosis:  NB-1.1 Food and nutrition-related knowledge deficit As related to DIabetes Type 2.  As evidenced by A1C 10.9%.    Intervention:  Nutrition and Diabetes education provided on My Plate, CHO counting, meal planning, portion sizes, timing of meals, avoiding snacks between meals unless having a low blood sugar, target ranges for A1C and blood sugars, signs/symptoms and treatment of hyper/hypoglycemia, monitoring blood sugars, taking medications as prescribed, benefits of exercising 30 minutes per day and prevention of complications of DM.  Goals  Keep up the good job! Increase vegetables with lunch and dinner Keep drinking water Eat 30-45 grams of CHO per meal. Lose 2-3 lbs per month. Increase physical activity as tolerated. Test BS 4 times per day. Get A1C down to 6.5% or less.   Teaching Method Utilized:  Visual Auditory Hands on  Handouts given during visit include:  The Plate Method   Meal Plan Card  Diabetes Instructions.    Barriers to learning/adherence to lifestyle change:  none  Demonstrated degree of understanding via:  Teach Back   Monitoring/Evaluation:  Dietary intake, exercise, , and body weight in 3 month(s).

## 2019-03-21 IMAGING — CT CT MAXILLOFACIAL W/O CM
3 series · 16 of 47 positions shown, 19 images · non-contrast
Comparison: None.

CLINICAL DATA: Facial pain and left ear pain, initial encounter

EXAM:
CT MAXILLOFACIAL WITHOUT CONTRAST
TECHNIQUE: Multidetector CT imaging of the maxillofacial structures was
performed. Multiplanar CT image reconstructions were also generated.

[Series 2: max soft · axial · 0.37mm/px · z∈[+1302,+1442]mm · 10 of 82 slices shown, 13 images]
[im 6/82  brain]
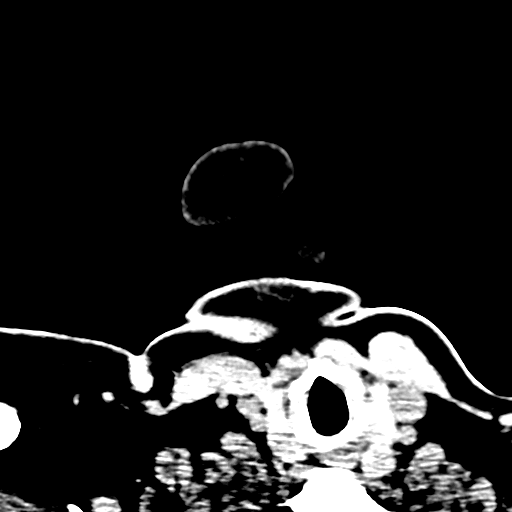
[im 6/82  bone]
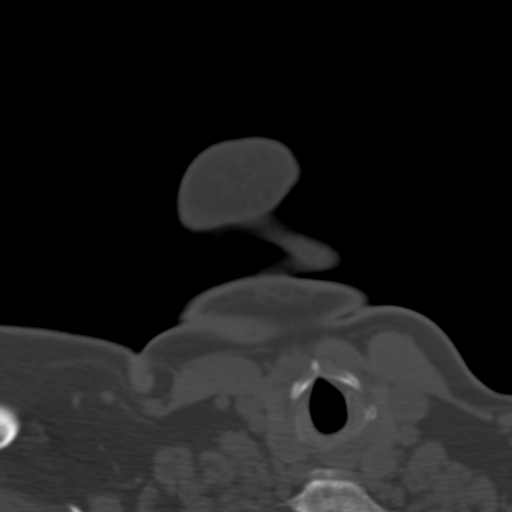
[im 14/82  bone]
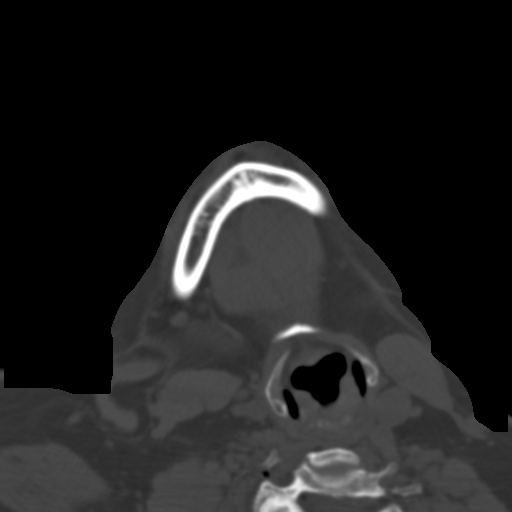
[im 23/82  bone]
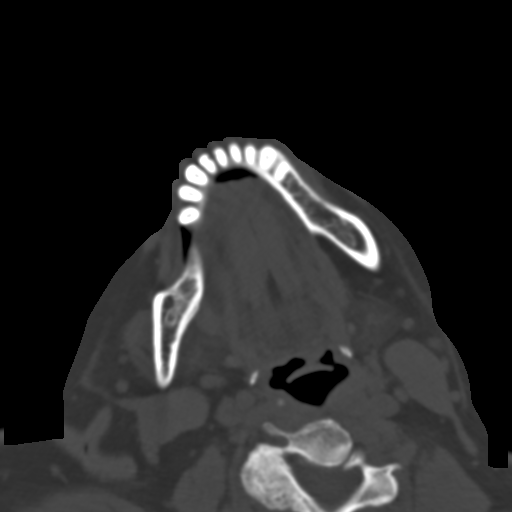
[im 28/82  bone]
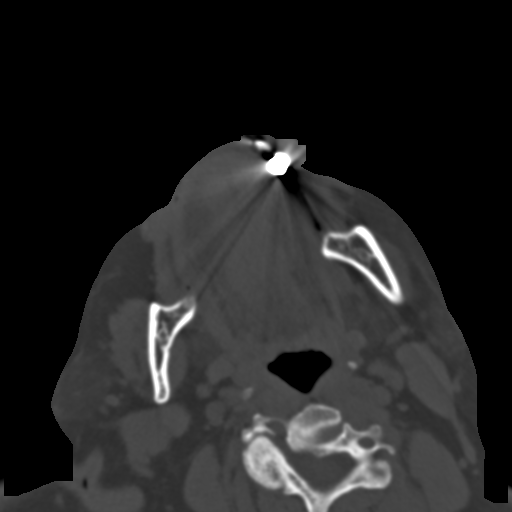
[im 37/82  brain]
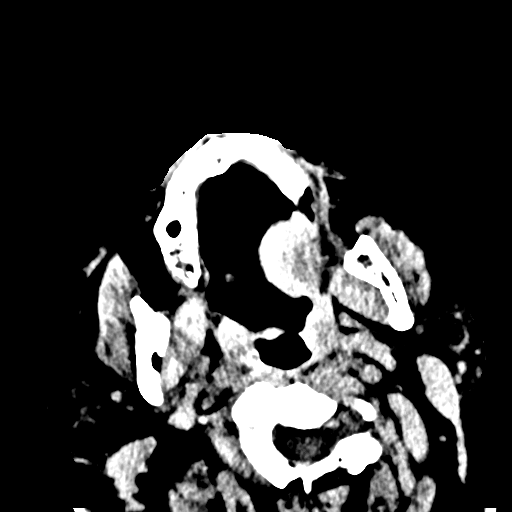
[im 37/82  bone]
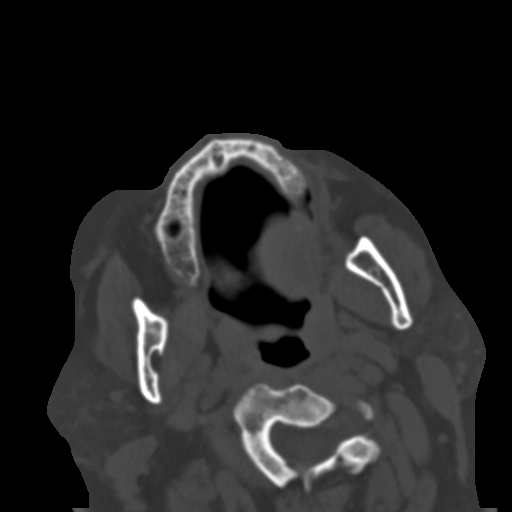
[im 45/82  bone]
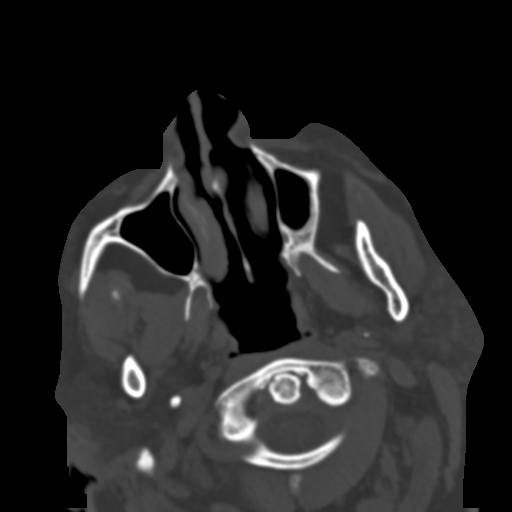
[im 54/82  bone]
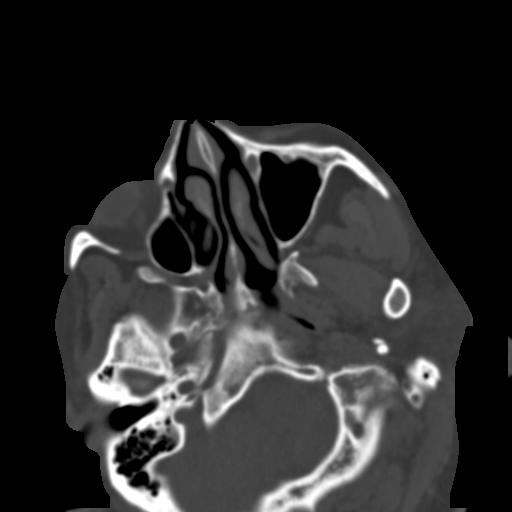
[im 62/82  bone]
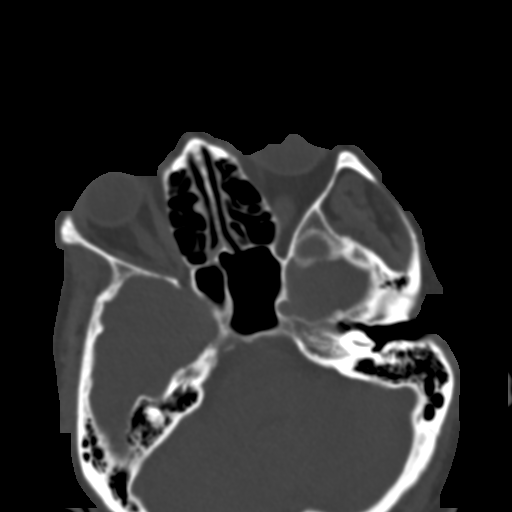
[im 68/82  brain]
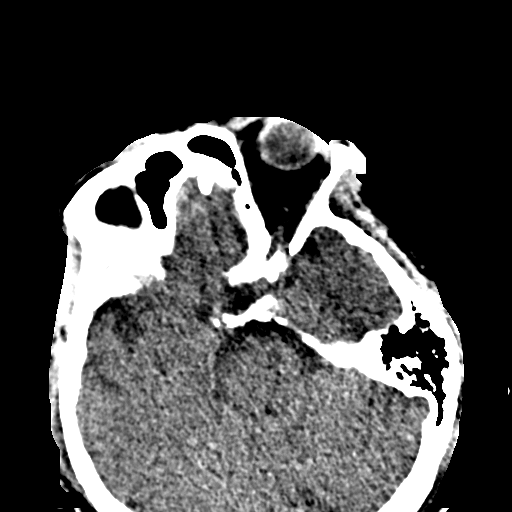
[im 68/82  bone]
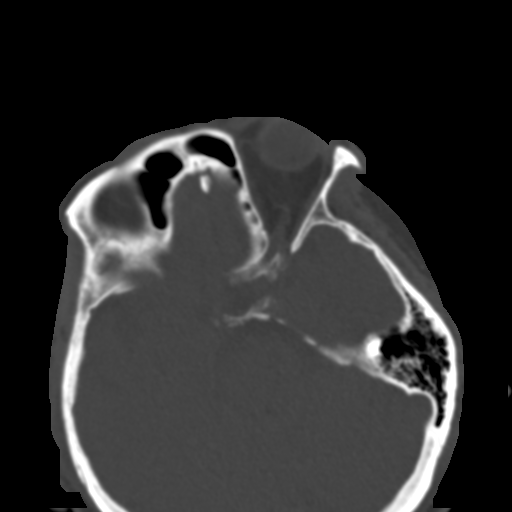
[im 76/82  bone]
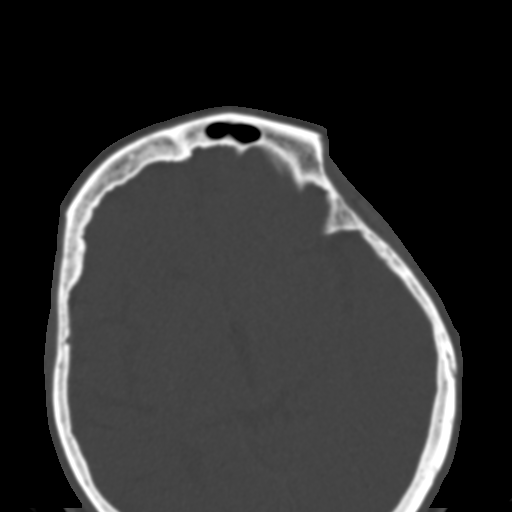

[Series 6: coronal soft · coronal · 0.33mm/px · 3 of 86 slices shown]
[im 29/86  bone]
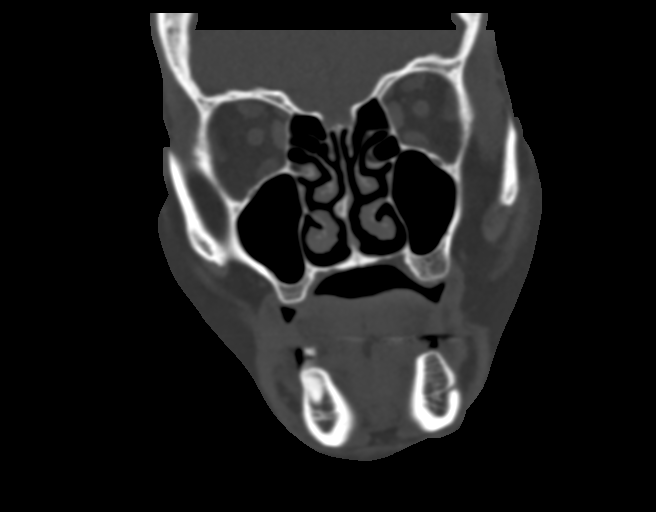
[im 38/86  bone]
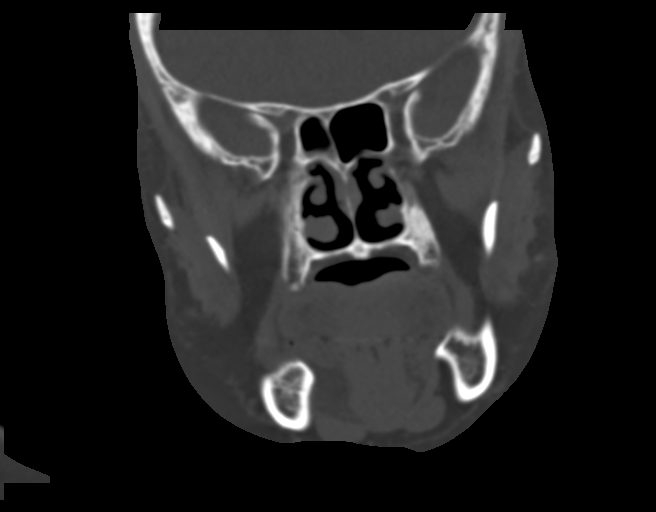
[im 48/86  bone]
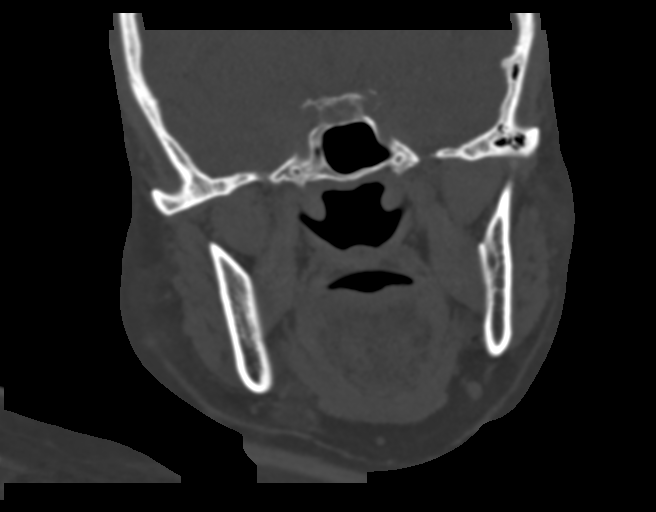

[Series 7: sagittal soft · sagittal · 0.37mm/px · 3 of 89 slices shown]
[im 30/89  bone]
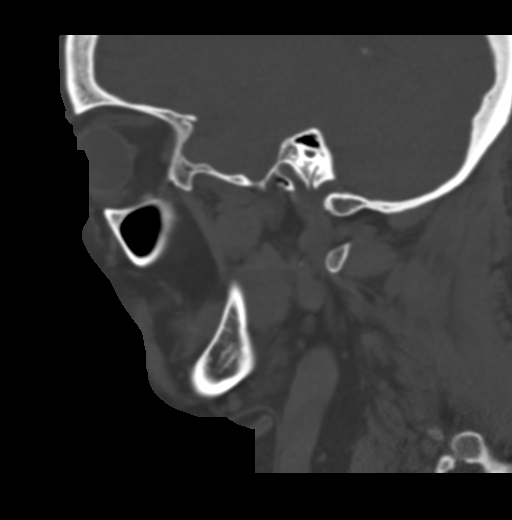
[im 45/89  bone]
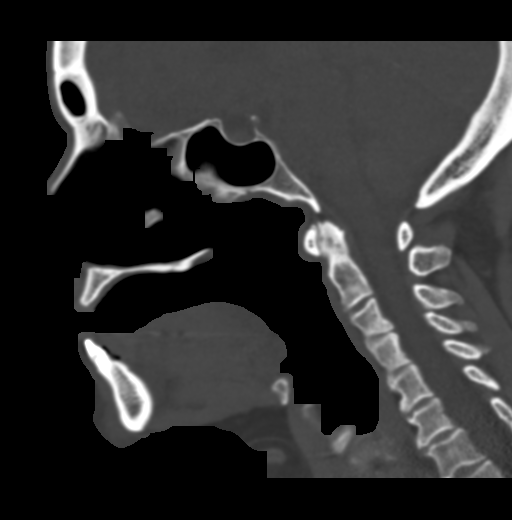
[im 59/89  bone]
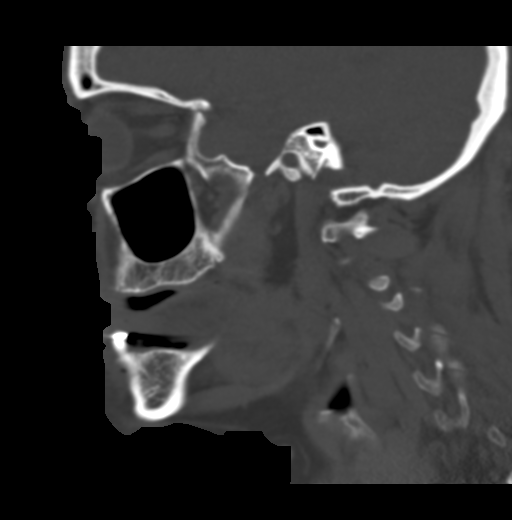

[16 of 47 positions shown; findings below may reference images not displayed]

FINDINGS: Osseous: No acute fracture is identified.  Dental caries are seen.

Orbits: Negative. No traumatic or inflammatory finding.

Sinuses: Clear.

Soft tissues: Negative.

Limited intracranial: No significant or unexpected finding.
IMPRESSION: No acute bony abnormality is noted.

## 2019-05-08 ENCOUNTER — Inpatient Hospital Stay (HOSPITAL_COMMUNITY): Payer: Medicare Other

## 2019-05-09 ENCOUNTER — Other Ambulatory Visit: Payer: Self-pay

## 2019-05-09 ENCOUNTER — Inpatient Hospital Stay (HOSPITAL_COMMUNITY): Payer: Medicare Other | Attending: Hematology

## 2019-05-09 DIAGNOSIS — Z79899 Other long term (current) drug therapy: Secondary | ICD-10-CM | POA: Diagnosis not present

## 2019-05-09 DIAGNOSIS — E119 Type 2 diabetes mellitus without complications: Secondary | ICD-10-CM | POA: Insufficient documentation

## 2019-05-09 DIAGNOSIS — D472 Monoclonal gammopathy: Secondary | ICD-10-CM | POA: Insufficient documentation

## 2019-05-09 DIAGNOSIS — Z87891 Personal history of nicotine dependence: Secondary | ICD-10-CM | POA: Diagnosis not present

## 2019-05-09 DIAGNOSIS — Z86718 Personal history of other venous thrombosis and embolism: Secondary | ICD-10-CM | POA: Insufficient documentation

## 2019-05-09 DIAGNOSIS — I10 Essential (primary) hypertension: Secondary | ICD-10-CM | POA: Insufficient documentation

## 2019-05-09 DIAGNOSIS — Z7901 Long term (current) use of anticoagulants: Secondary | ICD-10-CM | POA: Insufficient documentation

## 2019-05-09 DIAGNOSIS — J449 Chronic obstructive pulmonary disease, unspecified: Secondary | ICD-10-CM | POA: Insufficient documentation

## 2019-05-09 LAB — COMPREHENSIVE METABOLIC PANEL
ALT: 48 U/L — ABNORMAL HIGH (ref 0–44)
AST: 47 U/L — ABNORMAL HIGH (ref 15–41)
Albumin: 3.4 g/dL — ABNORMAL LOW (ref 3.5–5.0)
Alkaline Phosphatase: 317 U/L — ABNORMAL HIGH (ref 38–126)
Anion gap: 8 (ref 5–15)
BUN: 24 mg/dL — ABNORMAL HIGH (ref 8–23)
CO2: 25 mmol/L (ref 22–32)
Calcium: 8.9 mg/dL (ref 8.9–10.3)
Chloride: 103 mmol/L (ref 98–111)
Creatinine, Ser: 1.15 mg/dL — ABNORMAL HIGH (ref 0.44–1.00)
GFR calc Af Amer: 57 mL/min — ABNORMAL LOW (ref >60)
GFR calc non Af Amer: 49 mL/min — ABNORMAL LOW (ref >60)
Glucose, Bld: 105 mg/dL — ABNORMAL HIGH (ref 70–99)
Potassium: 3.6 mmol/L (ref 3.5–5.1)
Sodium: 136 mmol/L (ref 135–145)
Total Bilirubin: 0.7 mg/dL (ref 0.3–1.2)
Total Protein: 8.7 g/dL — ABNORMAL HIGH (ref 6.5–8.1)

## 2019-05-09 LAB — CBC WITH DIFFERENTIAL/PLATELET
Abs Immature Granulocytes: 0.06 10*3/uL (ref 0.00–0.07)
Basophils Absolute: 0.1 10*3/uL (ref 0.0–0.1)
Basophils Relative: 0 %
Eosinophils Absolute: 0.5 10*3/uL (ref 0.0–0.5)
Eosinophils Relative: 4 %
HCT: 42.2 % (ref 36.0–46.0)
Hemoglobin: 13 g/dL (ref 12.0–15.0)
Immature Granulocytes: 0 %
Lymphocytes Relative: 29 %
Lymphs Abs: 3.9 10*3/uL (ref 0.7–4.0)
MCH: 25.8 pg — ABNORMAL LOW (ref 26.0–34.0)
MCHC: 30.8 g/dL (ref 30.0–36.0)
MCV: 83.7 fL (ref 80.0–100.0)
Monocytes Absolute: 1 10*3/uL (ref 0.1–1.0)
Monocytes Relative: 8 %
Neutro Abs: 7.8 10*3/uL — ABNORMAL HIGH (ref 1.7–7.7)
Neutrophils Relative %: 59 %
Platelets: 255 10*3/uL (ref 150–400)
RBC: 5.04 MIL/uL (ref 3.87–5.11)
RDW: 12.6 % (ref 11.5–15.5)
WBC: 13.3 10*3/uL — ABNORMAL HIGH (ref 4.0–10.5)
nRBC: 0 % (ref 0.0–0.2)

## 2019-05-09 LAB — LACTATE DEHYDROGENASE: LDH: 159 U/L (ref 98–192)

## 2019-05-10 LAB — KAPPA/LAMBDA LIGHT CHAINS
Kappa free light chain: 936.3 mg/L — ABNORMAL HIGH (ref 3.3–19.4)
Kappa, lambda light chain ratio: 43.96 — ABNORMAL HIGH (ref 0.26–1.65)
Lambda free light chains: 21.3 mg/L (ref 5.7–26.3)

## 2019-05-11 LAB — PROTEIN ELECTROPHORESIS, SERUM
A/G Ratio: 0.6 — ABNORMAL LOW (ref 0.7–1.7)
Albumin ELP: 3.1 g/dL (ref 2.9–4.4)
Alpha-1-Globulin: 0.3 g/dL (ref 0.0–0.4)
Alpha-2-Globulin: 0.9 g/dL (ref 0.4–1.0)
Beta Globulin: 1.2 g/dL (ref 0.7–1.3)
Gamma Globulin: 2.5 g/dL — ABNORMAL HIGH (ref 0.4–1.8)
Globulin, Total: 5 g/dL — ABNORMAL HIGH (ref 2.2–3.9)
M-Spike, %: 1.6 g/dL — ABNORMAL HIGH
Total Protein ELP: 8.1 g/dL (ref 6.0–8.5)

## 2019-05-15 ENCOUNTER — Encounter (HOSPITAL_COMMUNITY): Payer: Self-pay | Admitting: Hematology

## 2019-05-15 ENCOUNTER — Inpatient Hospital Stay (HOSPITAL_BASED_OUTPATIENT_CLINIC_OR_DEPARTMENT_OTHER): Payer: Medicare Other | Admitting: Hematology

## 2019-05-15 ENCOUNTER — Other Ambulatory Visit: Payer: Self-pay

## 2019-05-15 DIAGNOSIS — E119 Type 2 diabetes mellitus without complications: Secondary | ICD-10-CM | POA: Diagnosis not present

## 2019-05-15 DIAGNOSIS — D472 Monoclonal gammopathy: Secondary | ICD-10-CM

## 2019-05-15 DIAGNOSIS — Z79899 Other long term (current) drug therapy: Secondary | ICD-10-CM | POA: Diagnosis not present

## 2019-05-15 DIAGNOSIS — J449 Chronic obstructive pulmonary disease, unspecified: Secondary | ICD-10-CM | POA: Diagnosis not present

## 2019-05-15 DIAGNOSIS — Z87891 Personal history of nicotine dependence: Secondary | ICD-10-CM | POA: Diagnosis not present

## 2019-05-15 DIAGNOSIS — Z86718 Personal history of other venous thrombosis and embolism: Secondary | ICD-10-CM | POA: Diagnosis not present

## 2019-05-15 DIAGNOSIS — I10 Essential (primary) hypertension: Secondary | ICD-10-CM | POA: Diagnosis not present

## 2019-05-15 DIAGNOSIS — Z7901 Long term (current) use of anticoagulants: Secondary | ICD-10-CM | POA: Diagnosis not present

## 2019-05-15 NOTE — Progress Notes (Signed)
Stephanie [name], Stephanie [name]   CLINIC:  Medical Oncology/Hematology  PCP:  Stephanie [name], [place] [address] [phone]   REASON FOR VISIT:  Follow-up for MGUS, elevated liver enzymes.  CURRENT THERAPY: Observation.    INTERVAL HISTORY:  Stephanie [name] 69 y.o. female seen for follow-up of monoclonal gammopathy, elevated liver enzymes and DVT.  Denies any fevers, night sweats or weight loss in the last 3 months.  Reports chronic back pain which is stable.  She also has pains in her legs which is also stable.  On and off numbness in the hands is unchanged.  Appetite is 50%.  Energy levels are low.  Denies any bleeding per rectum or melena.    REVIEW OF SYSTEMS:  Review of Systems  Musculoskeletal: Positive for back pain.  Neurological: Positive for numbness.  All other systems reviewed and are negative.    PAST MEDICAL/SURGICAL HISTORY:  Past Medical History:  Diagnosis Date  . Anemia   . Arthritis   . Asthma   . COPD (chronic obstructive pulmonary disease) ([place])   . Deep vein thrombosis (DVT) of both lower extremities ([place]) 06/27/2015  . Diabetes mellitus   . Fibromyalgia   . GERD (gastroesophageal reflux disease)   . H/O hiatal hernia   . Hypercholesteremia   . Hypertension   . Hyperthyroidism   . IBS (irritable bowel syndrome)   . Inner ear disease   . 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 PONV (postoperative nausea and vomiting)   . Tachycardia    Past Surgical History:  Procedure Laterality Date  . ABDOMINAL HYSTERECTOMY  partial  . CARPAL TUNNEL RELEASE Right 1991  . CATARACT EXTRACTION W/PHACO Right 05/08/2013   Procedure: CATARACT EXTRACTION PHACO AND INTRAOCULAR LENS PLACEMENT (IOC);  Surgeon: Tonny Branch, MD;  Location: [facility];  Service: Ophthalmology;  Laterality: Right;  CDE 10.31  . CATARACT EXTRACTION W/PHACO Left 08/17/2013   Procedure: CATARACT  EXTRACTION PHACO AND INTRAOCULAR LENS PLACEMENT (IOC);  Surgeon: Tonny Branch, MD;  Location: [facility];  Service: Ophthalmology;  Laterality: Left;  CDE:9.03  . CHOLECYSTECTOMY  1971  . COLONOSCOPY WITH PROPOFOL N/A 01/06/2016   Dr. Gala Romney: diverticulosis   . DENTAL SURGERY    . ESOPHAGOGASTRODUODENOSCOPY (EGD) WITH PROPOFOL N/A 01/06/2016   Dr. Gala Romney: normal s/p empiric dilation   . EYE SURGERY    . MALONEY DILATION N/A 01/06/2016   Procedure: Venia Minks DILATION;  Surgeon: Daneil Dolin, MD;  Location: [facility];  Service: Endoscopy;  Laterality: N/A;  . REVERSE SHOULDER ARTHROPLASTY Right 08/06/2017   Procedure: RIGHT REVERSE SHOULDER ARTHROPLASTY;  Surgeon: Netta Cedars, MD;  Location: [facility];  Service: Orthopedics;  Laterality: Right;  . WRIST GANGLION EXCISION Left      SOCIAL HISTORY:  Social History   Socioeconomic History  . Marital status: Married    Spouse name: Not on file  . Number of children: Not on file  . Years of education: Not on file  . Highest education level: Not on file  Occupational History  . Not on file  Tobacco Use  . Smoking status: Former Smoker    Packs/day: 0.25    Years: 30.00    Pack years: 7.50    Types: Cigarettes    Quit date: 02/17/2014    Years since quitting: 5.2  . Smokeless tobacco: Never Used  . Tobacco comment: some day smoker  Substance and Sexual Activity  .  Alcohol use: Yes    Alcohol/week: 0.0 standard drinks    Comment: occas  . Drug use: No  . Sexual activity: Not Currently    Birth control/protection: Surgical  Other Topics Concern  . Not on file  Social History Narrative  . Not on file   Social Determinants of Health   Financial Resource Strain:   . Difficulty of Paying Living Expenses:   Food Insecurity:   . Worried About Charity fundraiser in the Last Year:   . Arboriculturist in the Last Year:   Transportation Needs:   . Film/video editor (Medical):   Marland Kitchen Lack of Transportation (Non-Medical):   Physical  Activity:   . Days of Exercise per Week:   . Minutes of Exercise per Session:   Stress:   . Feeling of Stress :   Social Connections:   . Frequency of Communication with Friends and Family:   . Frequency of Social Gatherings with Friends and Family:   . Attends Religious Services:   . Active Member of Clubs or Organizations:   . Attends Archivist Meetings:   Marland Kitchen Marital Status:   Intimate Partner Violence:   . Fear of Current or Ex-Partner:   . Emotionally Abused:   Marland Kitchen Physically Abused:   . Sexually Abused:     FAMILY HISTORY:  Family History  Problem Relation Age of Onset  . Colon cancer Neg Hx     CURRENT MEDICATIONS:  Outpatient Encounter Medications as of 05/15/2019  Medication Sig  . ACCU-CHEK AVIVA PLUS test strip USE TO CHECK BLOOD SUGAR TID  . AMITIZA 8 MCG capsule TAKE 1 CAPSULE BY MOUTH TWICE DAILY WITH A MEAL  . apixaban (ELIQUIS) 5 MG TABS tablet Take 10 mg by mouth BID through Friday 5/19 evening. Take 8m by mouth BID starting 5/20 AM. (Patient taking differently: Take 5 mg by mouth 2 (two) times daily. )  . ARNUITY ELLIPTA 100 MCG/ACT AEPB Inhale 1 puff into the lungs daily.   . Ascorbic Acid (VITAMIN C) 1000 MG tablet Take 1,000 mg by mouth daily.   . cetirizine (ZYRTEC) 10 MG tablet Take 10 mg by mouth daily.  . Cholecalciferol (VITAMIN D) 2000 units tablet Take 4,000 Units by mouth daily.   . cimetidine (TAGAMET) 200 MG tablet Take 0.5 tablets (100 mg total) by mouth at bedtime.  .Marland KitchenCINNAMON PO Take 2-3 capsules by mouth 2 (two) times daily as needed (high blood sugar).   .Marland KitchenCod Liver Oil CAPS Take 1 capsule by mouth at bedtime.   .Marland KitchenDEXILANT 60 MG capsule TAKE 1 CAPSULE BY MOUTH EVERY DAY  . diclofenac sodium (VOLTAREN) 1 % GEL Apply 2 g topically 2 (two) times daily.  . Doxepin HCl 5 % CREA apply 2 grams (2 grams=2 inches) to affected area(s) 3 times daily. Takes as needed  . DULoxetine (CYMBALTA) 30 MG capsule Take 1 capsule (30 mg total) by mouth 2  (two) times daily.  . fluticasone (VERAMYST) 27.5 MCG/SPRAY nasal spray Place 2 sprays into the nose daily.   .Marland KitchenHUMALOG KWIKPEN 100 UNIT/ML KiwkPen If blood sugar is  90-150= 45 units, 150-200= 46 units. 200-250= 47 units at breakfast and supper Lunchtime: If BS is  90-150=30 units,150- 200=31 units,200-250 units 32  . levothyroxine (SYNTHROID, LEVOTHROID) 100 MCG tablet Take 100 mcg by mouth daily before breakfast.   . LYRICA 75 MG capsule Take 75 mg by mouth 2 (two) times daily.   . metolazone (  ZAROXOLYN) 5 MG tablet Take 1 tablet (5 mg total) by mouth 2 (two) times daily.  . metoprolol tartrate (LOPRESSOR) 25 MG tablet Take 1 tablet (25 mg total) by mouth 2 (two) times daily.  . pantoprazole (PROTONIX) 40 MG tablet Take 1 tablet (40 mg total) by mouth 2 (two) times daily before a meal.  . POTASSIUM CHLORIDE PO Take by mouth daily. Unsure of strength  . pravastatin (PRAVACHOL) 80 MG tablet Take 1 tablet (80 mg total) by mouth at bedtime.  . torsemide (DEMADEX) 20 MG tablet Take 2 tablets (40 mg total) by mouth daily.  . traZODone (DESYREL) 50 MG tablet Take 50 mg by mouth at bedtime.  . vitamin B-12 (CYANOCOBALAMIN) 1000 MCG tablet Take 1,000 mcg by mouth daily.   . vitamin E 200 UNIT capsule Take 200 Units by mouth daily.   . XULTOPHY 100-3.6 UNIT-MG/ML SOPN 60 Units daily.   Marland Kitchen acetaminophen (TYLENOL) 500 MG tablet Take 1,000 mg by mouth as needed.   Marland Kitchen albuterol (PROVENTIL HFA;VENTOLIN HFA) 108 (90 BASE) MCG/ACT inhaler Inhale 2 puffs into the lungs every 6 (six) hours as needed for wheezing or shortness of breath.  . benzonatate (TESSALON PERLES) 100 MG capsule Take 1-2 capsules by mouth daily as needed (cough).   . clobetasol ointment (TEMOVATE) 2.35 % Apply 1 application topically 2 (two) times daily as needed (rash).   . hydrOXYzine (ATARAX/VISTARIL) 25 MG tablet Take 25 mg by mouth every 8 (eight) hours as needed for itching.   . methocarbamol (ROBAXIN) 500 MG tablet Take 1 tablet (500  mg total) by mouth 3 (three) times daily as needed. (Patient not taking: Reported on 05/15/2019)  . PROAIR RESPICLICK 573 (90 Base) MCG/ACT AEPB Inhale 2 puffs into the lungs as needed.   . traMADol (ULTRAM) 50 MG tablet TK 1 T PO TID FOR PAIN   No facility-administered encounter medications on file as of 05/15/2019.    ALLERGIES:  Allergies  Allergen Reactions  . Tetracyclines & Related Anaphylaxis  . Banana Hives and Nausea And Vomiting  . Penicillins Rash and Other (See Comments)    Has patient had a PCN reaction causing immediate rash, facial/tongue/throat swelling, SOB or lightheadedness with hypotension: No Has patient had a PCN reaction causing severe rash involving mucus membranes or skin necrosis: No Has patient had a PCN reaction that required hospitalization No Has patient had a PCN reaction occurring within the last 10 years: No If all of the above answers are "NO", then may proceed with Cephalosporin use.      PHYSICAL EXAM:  ECOG Performance status: 1  Vitals:   05/15/19 1125  BP: 138/70  Pulse: 94  Resp: 16  Temp: (!) 97.3 F (36.3 C)  SpO2: 100%   Filed Weights   05/15/19 1125  Weight: 227 lb (103 kg)    Physical Exam Vitals reviewed.  Constitutional:      Appearance: Normal appearance.  Cardiovascular:     Rate and Rhythm: Normal rate and regular rhythm.     Heart sounds: Normal heart sounds.  Pulmonary:     Effort: Pulmonary effort is normal.     Breath sounds: Normal breath sounds.  Abdominal:     General: There is no distension.     Palpations: Abdomen is soft. There is no mass.  Skin:    General: Skin is warm.  Neurological:     General: No focal deficit present.     Mental Status: She is alert and oriented to person,  place, and time.  Psychiatric:        Mood and Affect: Mood normal.        Behavior: Behavior normal.      LABORATORY DATA:  I have reviewed the labs as listed.  CBC    Component Value Date/Time   WBC 13.3 (H)  05/09/2019 1103   RBC 5.04 05/09/2019 1103   HGB 13.0 05/09/2019 1103   HCT 42.2 05/09/2019 1103   PLT 255 05/09/2019 1103   MCV 83.7 05/09/2019 1103   MCH 25.8 (L) 05/09/2019 1103   MCHC 30.8 05/09/2019 1103   RDW 12.6 05/09/2019 1103   LYMPHSABS 3.9 05/09/2019 1103   MONOABS 1.0 05/09/2019 1103   EOSABS 0.5 05/09/2019 1103   BASOSABS 0.1 05/09/2019 1103   CMP Latest Ref Rng & Units 05/09/2019 03/06/2019 01/31/2019  Glucose 70 - 99 mg/dL 105(H) 59(L) 117(H)  BUN 8 - 23 mg/dL 24(H) 31(H) 27(H)  Creatinine 0.44 - 1.00 mg/dL 1.15(H) 1.15(H) 1.12(H)  Sodium 135 - 145 mmol/L 136 138 138  Potassium 3.5 - 5.1 mmol/L 3.6 3.7 3.6  Chloride 98 - 111 mmol/L 103 106 102  CO2 22 - 32 mmol/L 25 26 26   Calcium 8.9 - 10.3 mg/dL 8.9 9.2 9.1  Total Protein 6.5 - 8.1 g/dL 8.7(H) 8.9(H) 9.1(H)  Total Bilirubin 0.3 - 1.2 mg/dL 0.7 0.6 0.6  Alkaline Phos 38 - 126 U/L 317(H) 342(H) 323(H)  AST 15 - 41 U/L 47(H) 40 55(H)  ALT 0 - 44 U/L 48(H) 55(H) 57(H)       DIAGNOSTIC IMAGING:  I have independently reviewed the scans and discussed with the patient.     ASSESSMENT & PLAN:   MGUS (monoclonal gammopathy of unknown significance) 1.  IgG kappa MGUS: -Bone marrow biopsy on 09/03/2015 showing 9% plasma cells and negative skeletal survey. -History of nephrotic range proteinuria, resolved now. -Skeletal survey on 01/31/2019 did not show any lytic lesions. -We reviewed myeloma panel from 05/09/2019.  M spike has improved to 1.6 g from 2.1 g 3 months ago.  Free kappa light chains increased to 936 from 863.  Ratio slightly increased to 43.96. -Creatinine is mildly elevated at 1.1 and stable.  Hemoglobin is normal. -We will plan to repeat her blood work in 4 months.  We will continue to monitor skeletal survey once a year.  2.  Elevated liver enzymes: -Ultrasound abdomen on 04/25/2018 showed fatty liver. -AST and ALT are mildly elevated.  Weight loss was recommended.  3.  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 -She is on Eliquis indefinitely.  No bleeding issues reported.      Orders placed this encounter:  Orders Placed This Encounter  Procedures  . CBC with Differential  . Comprehensive metabolic panel  . Lactate dehydrogenase  . Protein electrophoresis, serum  . Kappa/lambda light chains      Derek Jack, [place] [phone]

## 2019-05-15 NOTE — Patient Instructions (Addendum)
[facility] at [facility] Discharge Instructions  You were seen today by Dr. Katragadda. He went over your recent lab results. He will see you back in 4 months for labs and follow up.   Thank you for choosing [facility] at [facility] to provide your oncology and hematology care.  To afford each patient quality time with our provider, please arrive at least 15 minutes before your scheduled appointment time.   If you have a lab appointment with the Cancer Center please come in thru the  Main Entrance and check in at the main information desk  You need to re-schedule your appointment should you arrive 10 or more minutes late.  We strive to give you quality time with our providers, and arriving late affects you and other patients whose appointments are after yours.  Also, if you no show three or more times for appointments you may be dismissed from the clinic at the providers discretion.     Again, thank you for choosing [facility].  Our hope is that these requests will decrease the amount of time that you wait before being seen by our physicians.       _____________________________________________________________  Should you have questions after your visit to [facility], please contact our office at [phone] between the hours of 8:00 a.m. and 4:30 p.m.  Voicemails left after 4:00 p.m. will not be returned until the following business day.  For prescription refill requests, have your pharmacy contact our office and allow 72 hours.    Cancer Center Support Programs:   > Cancer Support Group  2nd Tuesday of the month 1pm-2pm, Journey Room    

## 2019-05-15 NOTE — Assessment & Plan Note (Signed)
1.  IgG kappa MGUS: -Bone marrow biopsy on 09/03/2015 showing 9% plasma cells and negative skeletal survey. -History of nephrotic range proteinuria, resolved now. -Skeletal survey on 01/31/2019 did not show any lytic lesions. -We reviewed myeloma panel from 05/09/2019.  M spike has improved to 1.6 g from 2.1 g 3 months ago.  Free kappa light chains increased to 936 from 863.  Ratio slightly increased to 43.96. -Creatinine is mildly elevated at 1.1 and stable.  Hemoglobin is normal. -We will plan to repeat her blood work in 4 months.  We will continue to monitor skeletal survey once a year.  2.  Elevated liver enzymes: -Ultrasound abdomen on 04/25/2018 showed fatty liver. -AST and ALT are mildly elevated.  Weight loss was recommended.  3.  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 -She is on Eliquis indefinitely.  No bleeding issues reported.

## 2019-05-22 NOTE — Progress Notes (Signed)
Referring Provider: [name], [place] Primary Care Physician:  [name], FNP Primary GI: Dr. Gala Romney   Chief Complaint  Patient presents with  . Dysphagia    "little bit"  . fatty liver    HPI:   Stephanie [name] is a 69 y.o. female presenting today with a history of GERD, constipation, elevated mixed pattern LFTs with thorough evaluation of in setting of fatty liver with AMA, ANA positive, ASMA moderately positive. Negative Hep B, C. Hemochromatosis negative. Followed by oncology with history of nephrotic syndrome, IgG kappa monoclonal gammopathy, observed for now.Liver biopsy 2017 with mild chronic hepatitis, mild fatty liver, non significant fibrosis, non-specific findings and likely secondary to drug-induced etiology.She has a history of nonspecific esophageal motility disorder on barium pill esophagram January 2019.    Constipation: Amitiza 8 mcg po just takes as needed. Averaging once to twice a week. Other days will have a BM. When feels bloated will take Amitiza.   Elevated LFTs: stable from prior values, with March 2021 Alk Phos 317, AST 47, ALT 48. A1c recently 7.5 in Jan 2021. March 2020 fatty liver on ultrasound, no splenomegaly. Currently treating as fatty liver.   Solid food and liquid dysphagia. No odynophagia. Protonix BID. Taking on an empty stomach before meals. Has noted improvement with empiric dilation in past. No significant abdominal pain. Drinking lots of water.   Past Medical History:  Diagnosis Date  . Anemia   . Arthritis   . Asthma   . COPD (chronic obstructive pulmonary disease) ([place])   . Deep vein thrombosis (DVT) of both lower extremities ([place]) 06/27/2015  . Diabetes mellitus   . Fibromyalgia   . GERD (gastroesophageal reflux disease)   . H/O hiatal hernia   . Hypercholesteremia   . Hypertension   . Hyperthyroidism   . IBS (irritable bowel syndrome)   . Inner ear disease   . MGUS (monoclonal gammopathy of unknown significance)  12/13/2015  . PONV (postoperative nausea and vomiting)   . Tachycardia     Past Surgical History:  Procedure Laterality Date  . ABDOMINAL HYSTERECTOMY  partial  . CARPAL TUNNEL RELEASE Right 1991  . CATARACT EXTRACTION W/PHACO Right 05/08/2013   Procedure: CATARACT EXTRACTION PHACO AND INTRAOCULAR LENS PLACEMENT (IOC);  Surgeon: Tonny Branch, MD;  Location: [facility];  Service: Ophthalmology;  Laterality: Right;  CDE 10.31  . CATARACT EXTRACTION W/PHACO Left 08/17/2013   Procedure: CATARACT EXTRACTION PHACO AND INTRAOCULAR LENS PLACEMENT (IOC);  Surgeon: Tonny Branch, MD;  Location: [facility];  Service: Ophthalmology;  Laterality: Left;  CDE:9.03  . CHOLECYSTECTOMY  1971  . COLONOSCOPY WITH PROPOFOL N/A 01/06/2016   Dr. Gala Romney: diverticulosis   . DENTAL SURGERY    . ESOPHAGOGASTRODUODENOSCOPY (EGD) WITH PROPOFOL N/A 01/06/2016   Dr. Gala Romney: normal s/p empiric dilation   . EYE SURGERY    . MALONEY DILATION N/A 01/06/2016   Procedure: Venia Minks DILATION;  Surgeon: Daneil Dolin, MD;  Location: [facility];  Service: Endoscopy;  Laterality: N/A;  . REVERSE SHOULDER ARTHROPLASTY Right 08/06/2017   Procedure: RIGHT REVERSE SHOULDER ARTHROPLASTY;  Surgeon: Netta Cedars, MD;  Location: [facility];  Service: Orthopedics;  Laterality: Right;  . WRIST GANGLION EXCISION Left     Current Outpatient Medications  Medication Sig Dispense Refill  . ACCU-CHEK AVIVA PLUS test strip USE TO CHECK BLOOD SUGAR TID  11  . acetaminophen (TYLENOL) 500 MG tablet Take 1,000 mg by mouth as needed.     Marland Kitchen albuterol (  PROVENTIL HFA;VENTOLIN HFA) 108 (90 BASE) MCG/ACT inhaler Inhale 2 puffs into the lungs every 6 (six) hours as needed for wheezing or shortness of breath.    . AMITIZA 8 MCG capsule TAKE 1 CAPSULE BY MOUTH TWICE DAILY WITH A MEAL 60 capsule 11  . apixaban (ELIQUIS) 5 MG TABS tablet Take 10 mg by mouth BID through Friday 5/19 evening. Take 70m by mouth BID starting 5/20 AM. (Patient taking differently: Take 5 mg by mouth  2 (two) times daily. ) 60 tablet 3  . ARNUITY ELLIPTA 100 MCG/ACT AEPB Inhale 1 puff into the lungs daily.   11  . Ascorbic Acid (VITAMIN C) 1000 MG tablet Take 1,000 mg by mouth daily.     . cetirizine (ZYRTEC) 10 MG tablet Take 10 mg by mouth daily.    . Cholecalciferol (VITAMIN D) 2000 units tablet Take 4,000 Units by mouth daily.     . cimetidine (TAGAMET) 200 MG tablet Take 0.5 tablets (100 mg total) by mouth at bedtime. 30 tablet 2  . CINNAMON PO Take 2-3 capsules by mouth 2 (two) times daily as needed (high blood sugar).     . clobetasol ointment (TEMOVATE) 01.28% Apply 1 application topically 2 (two) times daily as needed (rash).     .Marland KitchenCod Liver Oil CAPS Take 1 capsule by mouth at bedtime.     . diclofenac sodium (VOLTAREN) 1 % GEL Apply 2 g topically 2 (two) times daily. 1 Tube 0  . Doxepin HCl 5 % CREA apply 2 grams (2 grams=2 inches) to affected area(s) 3 times daily. Takes as needed  5  . DULoxetine (CYMBALTA) 30 MG capsule Take 1 capsule (30 mg total) by mouth 2 (two) times daily. 30 capsule -0  . fluticasone (VERAMYST) 27.5 MCG/SPRAY nasal spray Place 2 sprays into the nose daily.     .Marland KitchenHUMALOG KWIKPEN 100 UNIT/ML KiwkPen If blood sugar is  90-150= 45 units, 150-200= 46 units. 200-250= 47 units at breakfast and supper Lunchtime: If BS is  90-150=30 units,150- 200=31 units,200-250 units 32  5  . hydrOXYzine (ATARAX/VISTARIL) 25 MG tablet Take 25 mg by mouth every 8 (eight) hours as needed for itching.     . levothyroxine (SYNTHROID) 88 MCG tablet Take 88 mcg by mouth daily before breakfast.    . LYRICA 75 MG capsule Take 75 mg by mouth 2 (two) times daily.     . methocarbamol (ROBAXIN) 500 MG tablet Take 1 tablet (500 mg total) by mouth 3 (three) times daily as needed. 60 tablet 1  . metolazone (ZAROXOLYN) 5 MG tablet Take 1 tablet (5 mg total) by mouth 2 (two) times daily. 60 tablet 0  . metoprolol tartrate (LOPRESSOR) 25 MG tablet Take 1 tablet (25 mg total) by mouth 2 (two) times  daily. 60 tablet 0  . pantoprazole (PROTONIX) 40 MG tablet Take 1 tablet (40 mg total) by mouth 2 (two) times daily before a meal. 60 tablet 3  . POTASSIUM CHLORIDE PO Take by mouth daily. Unsure of strength    . pravastatin (PRAVACHOL) 80 MG tablet Take 1 tablet (80 mg total) by mouth at bedtime. 30 tablet 0  . torsemide (DEMADEX) 20 MG tablet Take 2 tablets (40 mg total) by mouth daily. (Patient taking differently: Take 20 mg by mouth as needed. ) 60 tablet 0  . traMADol (ULTRAM) 50 MG tablet TK 1 T PO TID FOR PAIN  1  . traZODone (DESYREL) 50 MG tablet Take 50 mg by  mouth at bedtime.    . vitamin B-12 (CYANOCOBALAMIN) 1000 MCG tablet Take 1,000 mcg by mouth daily.     . vitamin E 200 UNIT capsule Take 200 Units by mouth daily.     . XULTOPHY 100-3.6 UNIT-MG/ML SOPN 60 Units daily.   0   No current facility-administered medications for this visit.    Allergies as of 05/23/2019 - Review Complete 05/23/2019  Allergen Reaction Noted  . Tetracyclines & related Anaphylaxis 09/19/2010  . Banana Hives and Nausea And Vomiting 05/04/2013  . Penicillins Rash and Other (See Comments) 09/19/2010    Family History  Problem Relation Age of Onset  . Colon cancer Neg Hx     Social History   Socioeconomic History  . Marital status: Married    Spouse name: Not on file  . Number of children: Not on file  . Years of education: Not on file  . Highest education level: Not on file  Occupational History  . Not on file  Tobacco Use  . Smoking status: Former Smoker    Packs/day: 0.25    Years: 30.00    Pack years: 7.50    Types: Cigarettes    Quit date: 02/17/2014    Years since quitting: 5.2  . Smokeless tobacco: Never Used  . Tobacco comment: some day smoker  Substance and Sexual Activity  . Alcohol use: Yes    Alcohol/week: 0.0 standard drinks    Comment: occas  . Drug use: No  . Sexual activity: Not Currently    Birth control/protection: Surgical  Other Topics Concern  . Not on file    Social History Narrative  . Not on file   Social Determinants of Health   Financial Resource Strain:   . Difficulty of Paying Living Expenses:   Food Insecurity:   . Worried About Charity fundraiser in the Last Year:   . Arboriculturist in the Last Year:   Transportation Needs:   . Film/video editor (Medical):   Marland Kitchen Lack of Transportation (Non-Medical):   Physical Activity:   . Days of Exercise per Week:   . Minutes of Exercise per Session:   Stress:   . Feeling of Stress :   Social Connections:   . Frequency of Communication with Friends and Family:   . Frequency of Social Gatherings with Friends and Family:   . Attends Religious Services:   . Active Member of Clubs or Organizations:   . Attends Archivist Meetings:   Marland Kitchen Marital Status:     Review of Systems: Gen: Denies fever, chills, anorexia. Denies fatigue, weakness, weight loss.  CV: Denies chest pain, palpitations, syncope, peripheral edema, and claudication. Resp: occasional Borkowski  GI: see HPI Derm: Denies rash, itching, dry skin Psych: Denies depression, anxiety, memory loss, confusion. No homicidal or suicidal ideation.  Heme: Denies bruising, bleeding, and enlarged lymph nodes.  Physical Exam: BP 137/83   Pulse 93   Temp (!) 97.3 F (36.3 C) (Temporal)   Ht 5' 8"  (1.727 m)   Wt 226 lb 12.8 oz (102.9 kg)   BMI 34.48 kg/m  General:   Alert and oriented. No distress noted. Pleasant and cooperative.  Head:  Normocephalic and atraumatic. Eyes:  Conjuctiva clear without scleral icterus. Mouth:  Mask in place Cardiac: S1 S2 present Lungs: clear bilaterally Abdomen:  +BS, soft, non-tender and non-distended. No rebound or guarding. No HSM or masses noted. Msk:  Symmetrical without gross deformities. Normal posture. Extremities:  Without  edema. Neurologic:  Alert and  oriented x4 Psych:  Alert and cooperative. Normal mood and affect.  ASSESSMENT: RISHIKA MCCOLLOM is a 69 y.o. female presenting today  with history of GERD, constipation, elevatedmixed patternLFTs with thorough evaluation ofin setting of fatty liver and currently following serially now, with persistent dysphagia.   Dysphagia: EGD in 2017 with empiric dilation and responded well to this. Will pursue EGD/dilation again in future. Non-specific esophageal motility disorder on BPE in Jan 2019.   Constipation: continue with novel dosing of Amitiza as needed, which is working well for her.  GERD: continue Protonix BID  Elevated LFTs: continue to stay near her baseline. Thorough evaluation as noted in HPI. Liver biopsy in remote past. If trending upwards, recommend repeat biopsy. [place] on file last year with normal spleen and fatty liver.   PLAN:   Proceed with upper endoscopy/dilation in the near future with Dr. Gala Romney. The risks, benefits, and alternatives have been discussed in detail with patient. They have stated understanding and desire to proceed. PROPOFOL due to polypharmacy HOLD Eliquis 48 hours prior  Continue Amitiza  Continue Protonix BID  Follow LFTs serially  Continue diet/behavior modification  Return in 6 months  Annitta Needs, PhD, [facility] Gastroenterology

## 2019-05-23 ENCOUNTER — Encounter: Payer: Self-pay | Admitting: Gastroenterology

## 2019-05-23 ENCOUNTER — Ambulatory Visit (INDEPENDENT_AMBULATORY_CARE_PROVIDER_SITE_OTHER): Payer: Medicare Other | Admitting: Gastroenterology

## 2019-05-23 ENCOUNTER — Other Ambulatory Visit: Payer: Self-pay

## 2019-05-23 ENCOUNTER — Encounter: Payer: Self-pay | Admitting: Emergency Medicine

## 2019-05-23 VITALS — BP 137/83 | HR 93 | Temp 97.3°F | Ht 68.0 in | Wt 226.8 lb

## 2019-05-23 DIAGNOSIS — R7989 Other specified abnormal findings of blood chemistry: Secondary | ICD-10-CM

## 2019-05-23 DIAGNOSIS — R1310 Dysphagia, unspecified: Secondary | ICD-10-CM

## 2019-05-23 DIAGNOSIS — R945 Abnormal results of liver function studies: Secondary | ICD-10-CM

## 2019-05-23 NOTE — Patient Instructions (Addendum)
We are arranging an upper endoscopy with dilation by Dr. Gala Romney in the near future. Just take 1/2 dose of Xultophy the evening before. Stop Eliquis 2 days before the procedure.  We will see you back in 6 months!  I enjoyed seeing you again today! As you know, I value our relationship and want to provide genuine, compassionate, and quality care. I welcome your feedback. If you receive a survey regarding your visit,  I greatly appreciate you taking time to fill this out. See you next time!  Annitta Needs, PhD, ANP-BC [facility]astroenter[facility]

## 2019-05-25 NOTE — Progress Notes (Signed)
Cc'ed to pcp °

## 2019-06-05 ENCOUNTER — Other Ambulatory Visit: Payer: Self-pay

## 2019-06-05 ENCOUNTER — Other Ambulatory Visit (HOSPITAL_COMMUNITY)
Admission: RE | Admit: 2019-06-05 | Discharge: 2019-06-05 | Disposition: A | Discharge status: Home / Self Care | Payer: Medicare Other | Source: Ambulatory Visit | Attending: Family Medicine | Admitting: Family Medicine

## 2019-06-05 DIAGNOSIS — E039 Hypothyroidism, unspecified: Secondary | ICD-10-CM | POA: Insufficient documentation

## 2019-06-05 DIAGNOSIS — E1140 Type 2 diabetes mellitus with diabetic neuropathy, unspecified: Secondary | ICD-10-CM | POA: Diagnosis not present

## 2019-06-05 DIAGNOSIS — E785 Hyperlipidemia, unspecified: Secondary | ICD-10-CM | POA: Diagnosis not present

## 2019-06-05 DIAGNOSIS — I10 Essential (primary) hypertension: Secondary | ICD-10-CM | POA: Diagnosis not present

## 2019-06-05 LAB — TSH: TSH: 1.267 u[IU]/mL (ref 0.350–4.500)

## 2019-06-05 LAB — HEMOGLOBIN A1C
Hgb A1c MFr Bld: 7.8 % — ABNORMAL HIGH (ref 4.8–5.6)
Mean Plasma Glucose: 177.16 mg/dL

## 2019-06-05 LAB — LIPID PANEL
Cholesterol: 233 mg/dL — ABNORMAL HIGH (ref 0–200)
HDL: 53 mg/dL (ref >40)
LDL Cholesterol: 149 mg/dL — ABNORMAL HIGH (ref 0–99)
Total CHOL/HDL Ratio: 4.4 RATIO
Triglycerides: 153 mg/dL — ABNORMAL HIGH (ref <150)
VLDL: 31 mg/dL (ref 0–40)

## 2019-06-05 LAB — BASIC METABOLIC PANEL
Anion gap: 8 (ref 5–15)
BUN: 33 mg/dL — ABNORMAL HIGH (ref 8–23)
CO2: 27 mmol/L (ref 22–32)
Calcium: 9 mg/dL (ref 8.9–10.3)
Chloride: 101 mmol/L (ref 98–111)
Creatinine, Ser: 1.32 mg/dL — ABNORMAL HIGH (ref 0.44–1.00)
GFR calc Af Amer: 48 mL/min — ABNORMAL LOW (ref >60)
GFR calc non Af Amer: 41 mL/min — ABNORMAL LOW (ref >60)
Glucose, Bld: 198 mg/dL — ABNORMAL HIGH (ref 70–99)
Potassium: 4.3 mmol/L (ref 3.5–5.1)
Sodium: 136 mmol/L (ref 135–145)

## 2019-06-08 ENCOUNTER — Telehealth: Payer: Self-pay | Admitting: Emergency Medicine

## 2019-06-08 NOTE — Telephone Encounter (Signed)
Faxed letter for clearance on 4/9, no response left  left vm with office following up

## 2019-06-12 ENCOUNTER — Ambulatory Visit: Payer: Medicare Other | Admitting: Nutrition

## 2019-06-16 ENCOUNTER — Telehealth: Payer: Self-pay | Admitting: Emergency Medicine

## 2019-06-16 NOTE — Telephone Encounter (Signed)
Called to f/u on clearance [facility] is closed on fridays

## 2019-06-20 ENCOUNTER — Telehealth: Payer: Self-pay | Admitting: Emergency Medicine

## 2019-06-20 NOTE — Telephone Encounter (Signed)
Called [employer] nurse Tomeika Weinmann. Tried to leave voice mail to f/u with clearance, nurse is not in the office. vm is full and I was unable to leave vm.

## 2019-06-28 ENCOUNTER — Telehealth: Payer: Self-pay | Admitting: Emergency Medicine

## 2019-06-28 NOTE — Telephone Encounter (Signed)
error 

## 2019-06-28 NOTE — Telephone Encounter (Signed)
Spoke to Timon Geissinger at the [facility] at she stated she thought they had faxed over clearance. Notified Daanish Copes the nurse that we have not received it yet and she stated  She will resend the clearance today

## 2019-07-04 ENCOUNTER — Telehealth: Payer: Self-pay | Admitting: Emergency Medicine

## 2019-07-04 NOTE — Telephone Encounter (Signed)
Made several attempts since 4/22 to get clearance for pt to d/c her eliquis for 48 hours prior to endoscopy with propofol. Spoken  To Yaira Bernardi three times and faxed letter  Twice to office. Nurse Allana Shrestha states it is in the providers box

## 2019-07-05 DIAGNOSIS — G894 Chronic pain syndrome: Secondary | ICD-10-CM | POA: Diagnosis not present

## 2019-07-05 DIAGNOSIS — M797 Fibromyalgia: Secondary | ICD-10-CM | POA: Diagnosis not present

## 2019-07-05 DIAGNOSIS — I10 Essential (primary) hypertension: Secondary | ICD-10-CM | POA: Diagnosis not present

## 2019-07-05 DIAGNOSIS — I5032 Chronic diastolic (congestive) heart failure: Secondary | ICD-10-CM | POA: Diagnosis not present

## 2019-07-11 ENCOUNTER — Telehealth: Payer: Self-pay | Admitting: Emergency Medicine

## 2019-07-11 NOTE — Telephone Encounter (Signed)
Called left vm with office manager to get clearance for pt. pcp is on maternity leave

## 2019-08-01 ENCOUNTER — Telehealth: Payer: Self-pay

## 2019-08-01 NOTE — Telephone Encounter (Signed)
FYI, LL has made several attempts to get ok from PCP to hold Eliquis prior to EGD/DIL. See previous telephone notes.

## 2019-08-02 ENCOUNTER — Telehealth: Payer: Self-pay | Admitting: Emergency Medicine

## 2019-08-02 NOTE — Telephone Encounter (Signed)
Faxed 4th request today. To office manger

## 2019-08-04 ENCOUNTER — Telehealth: Payer: Self-pay | Admitting: Emergency Medicine

## 2019-08-04 NOTE — Telephone Encounter (Signed)
Pt received clearance to d/c her eliquis 48 hours prior to endoscopy/ dialation w/ propofol. Document sent to scan center

## 2019-08-04 NOTE — Telephone Encounter (Signed)
Called and informed pt we received ok for her to hold Eliquis prior to procedure. Had cancellations next week for procedure. Offered to schedule EGD/DIL w/Prop next week, but she is unable to have procedure next week d/t other appts. Advised her I will place her on cancellation list to call her if someone cancels after next week since she has been waiting for procedure.

## 2019-08-08 ENCOUNTER — Telehealth: Payer: Self-pay

## 2019-08-08 NOTE — Telephone Encounter (Signed)
Called pt, EGD/DIL w/RMR scheduled for 08/29/19 at 12:30pm. Pt aware to hold Eliquis for 48 hours prior to procedure. Orders entered.  PA for EGD/DIL submitted via [facility] website. Case approved. PA# [id number], valid 08/29/19-11/27/19.

## 2019-08-08 NOTE — Telephone Encounter (Signed)
Pre-op/covid test 08/28/19 at 8:30am. Appt letter mailed with procedure instructions.

## 2019-08-24 NOTE — Patient Instructions (Signed)
Stephanie [name]  08/24/2019     @PREFPERIOPPHARMACY @   Your procedure is scheduled on  08/29/2019   Report to [facility] at  1045  A.M.  Call this number if you have problems the morning of surgery:  [phone]   Remember:  Follow the diet instructions given to you by Dr Roseanne Kaufman office.                     Take these medicines the morning of surgery with A SIP OF WATER lyrica, robaxin(if needed), metoprolol, protonix, ultram(if needed), zyrtec, cymbalta, hydroxyzine, levothyroxine. Take 1/2 of your sliding scale insulin the night before your procedure. DO NOT take any medications for diabetes the morning of your procedure. Use your nebulizer and your inhalers before you come.    Do not wear jewelry, make-up or nail polish.  Do not wear lotions, powders, or perfumes. Please wear deodorant and brush your teeth.  Do not shave 48 hours prior to surgery.  Men may shave face and neck.  Do not bring valuables to the hospital.  [facility] is not responsible for any belongings or valuables.  Contacts, dentures or bridgework may not be worn into surgery.  Leave your suitcase in the car.  After surgery it may be brought to your room.  For patients admitted to the hospital, discharge time will be determined by your treatment team.  Patients discharged the day of surgery will not be allowed to drive home.   Name and phone number of your driver:   family Special instructions:  DO NOT smoke the morning of your procedure.  Please read over the following fact sheets that you were given. Anesthesia Post-op Instructions and Care and Recovery After Surgery       Esophageal Dilatation Esophageal dilatation, also called esophageal dilation, is a procedure to widen or open (dilate) a blocked or narrowed part of the esophagus. The esophagus is the part of the body that moves food and liquid from the mouth to the stomach. You may need this procedure if:  You have a buildup of scar tissue  in your esophagus that makes it difficult, painful, or impossible to swallow. This can be caused by gastroesophageal reflux disease (GERD).  You have cancer of the esophagus.  There is a problem with how food moves through your esophagus. In some cases, you may need this procedure repeated at a later time to dilate the esophagus gradually. Tell a health care provider about:  Any allergies you have.  All medicines you are taking, including vitamins, herbs, eye drops, creams, and over-the-counter medicines.  Any problems you or family members have had with anesthetic medicines.  Any blood disorders you have.  Any surgeries you have had.  Any medical conditions you have.  Any antibiotic medicines you are required to take before dental procedures.  Whether you are pregnant or may be pregnant. What are the risks? Generally, this is a safe procedure. However, problems may occur, including:  Bleeding due to a tear in the lining of the esophagus.  A hole (perforation) in the esophagus. What happens before the procedure?  Follow instructions from your health care provider about eating or drinking restrictions.  Ask your health care provider about changing or stopping your regular medicines. This is especially important if you are taking diabetes medicines or blood thinners.  Plan to have someone take you home from the hospital or clinic.  Plan to have  a responsible adult care for you for at least 24 hours after you leave the hospital or clinic. This is important. What happens during the procedure?  You may be given a medicine to help you relax (sedative).  A numbing medicine may be sprayed into the back of your throat, or you may gargle the medicine.  Your health care provider may perform the dilatation using various surgical instruments, such as: ? Simple dilators. This instrument is carefully placed in the esophagus to stretch it. ? Guided wire bougies. This involves using an  endoscope to insert a wire into the esophagus. A dilator is passed over this wire to enlarge the esophagus. Then the wire is removed. ? Balloon dilators. An endoscope with a small balloon at the end is inserted into the esophagus. The balloon is inflated to stretch the esophagus and open it up. The procedure may vary among health care providers and hospitals. What happens after the procedure?  Your blood pressure, heart rate, breathing rate, and blood oxygen level will be monitored until the medicines you were given have worn off.  Your throat may feel slightly sore and numb. This will improve slowly over time.  You will not be allowed to eat or drink until your throat is no longer numb.  When you are able to drink, urinate, and sit on the edge of the bed without nausea or dizziness, you may be able to return home. Follow these instructions at home:  Take over-the-counter and prescription medicines only as told by your health care provider.  Do not drive for 24 hours if you were given a sedative during your procedure.  You should have a responsible adult with you for 24 hours after the procedure.  Follow instructions from your health care provider about any eating or drinking restrictions.  Do not use any products that contain nicotine or tobacco, such as cigarettes and e-cigarettes. If you need help quitting, ask your health care provider.  Keep all follow-up visits as told by your health care provider. This is important. Get help right away if you:  Have a fever.  Have chest pain.  Have pain that is not relieved by medication.  Have trouble breathing.  Have trouble swallowing.  Vomit blood. Summary  Esophageal dilatation, also called esophageal dilation, is a procedure to widen or open (dilate) a blocked or narrowed part of the esophagus.  Plan to have someone take you home from the hospital or clinic.  For this procedure, a numbing medicine may be sprayed into the back  of your throat, or you may gargle the medicine.  Do not drive for 24 hours if you were given a sedative during your procedure. This information is not intended to replace advice given to you by your health care provider. Make sure you discuss any questions you have with your health care provider. Document Revised: 11/30/2018 Document Reviewed: 12/08/2016 Elsevier Patient Education  2020 [place].  Upper Endoscopy, Adult, Care After This sheet gives you information about how to care for yourself after your procedure. Your health care provider may also give you more specific instructions. If you have problems or questions, contact your health care provider. What can I expect after the procedure? After the procedure, it is common to have:  A sore throat.  Mild stomach pain or discomfort.  Bloating.  Nausea. Follow these instructions at home:   Follow instructions from your health care provider about what to eat or drink after your procedure.  Return to  your normal activities as told by your health care provider. Ask your health care provider what activities are safe for you.  Take over-the-counter and prescription medicines only as told by your health care provider.  Do not drive for 24 hours if you were given a sedative during your procedure.  Keep all follow-up visits as told by your health care provider. This is important. Contact a health care provider if you have:  A sore throat that lasts longer than one day.  Trouble swallowing. Get help right away if:  You vomit blood or your vomit looks like coffee grounds.  You have: ? A fever. ? Bloody, black, or tarry stools. ? A severe sore throat or you cannot swallow. ? Difficulty breathing. ? Severe pain in your chest or abdomen. Summary  After the procedure, it is common to have a sore throat, mild stomach discomfort, bloating, and nausea.  Do not drive for 24 hours if you were given a sedative during the  procedure.  Follow instructions from your health care provider about what to eat or drink after your procedure.  Return to your normal activities as told by your health care provider. This information is not intended to replace advice given to you by your health care provider. Make sure you discuss any questions you have with your health care provider. Document Revised: 07/27/2017 Document Reviewed: 07/05/2017 Elsevier Patient Education  2020 [place] After These instructions provide you with information about caring for yourself after your procedure. Your health care provider may also give you more specific instructions. Your treatment has been planned according to current medical practices, but problems sometimes occur. Call your health care provider if you have any problems or questions after your procedure. What can I expect after the procedure? After your procedure, you may:  Feel sleepy for several hours.  Feel clumsy and have poor balance for several hours.  Feel forgetful about what happened after the procedure.  Have poor judgment for several hours.  Feel nauseous or vomit.  Have a sore throat if you had a breathing tube during the procedure. Follow these instructions at home: For at least 24 hours after the procedure:      Have a responsible adult stay with you. It is important to have someone help care for you until you are awake and alert.  Rest as needed.  Do not: ? Participate in activities in which you could fall or become injured. ? Drive. ? Use heavy machinery. ? Drink alcohol. ? Take sleeping pills or medicines that cause drowsiness. ? Make important decisions or sign legal documents. ? Take care of children on your own. Eating and drinking  Follow the diet that is recommended by your health care provider.  If you vomit, drink water, juice, or soup when you can drink without vomiting.  Make sure you have little or  no nausea before eating solid foods. General instructions  Take over-the-counter and prescription medicines only as told by your health care provider.  If you have sleep apnea, surgery and certain medicines can increase your risk for breathing problems. Follow instructions from your health care provider about wearing your sleep device: ? Anytime you are sleeping, including during daytime naps. ? While taking prescription pain medicines, sleeping medicines, or medicines that make you drowsy.  If you smoke, do not smoke without supervision.  Keep all follow-up visits as told by your health care provider. This is important. Contact a health care provider if:  You keep feeling nauseous or you keep vomiting.  You feel light-headed.  You develop a rash.  You have a fever. Get help right away if:  You have trouble breathing. Summary  For several hours after your procedure, you may feel sleepy and have poor judgment.  Have a responsible adult stay with you for at least 24 hours or until you are awake and alert. This information is not intended to replace advice given to you by your health care provider. Make sure you discuss any questions you have with your health care provider. Document Revised: 05/03/2017 Document Reviewed: 05/26/2015 Elsevier Patient Education  [place].

## 2019-08-28 ENCOUNTER — Encounter (HOSPITAL_COMMUNITY): Payer: Self-pay

## 2019-08-28 ENCOUNTER — Other Ambulatory Visit (HOSPITAL_COMMUNITY)
Admission: RE | Admit: 2019-08-28 | Discharge: 2019-08-28 | Disposition: A | Discharge status: Home / Self Care | Payer: Medicare Other | Source: Ambulatory Visit | Attending: Internal Medicine | Admitting: Internal Medicine

## 2019-08-28 ENCOUNTER — Encounter (HOSPITAL_COMMUNITY)
Admission: RE | Admit: 2019-08-28 | Discharge: 2019-08-28 | Disposition: A | Discharge status: Home / Self Care | Payer: Medicare Other | Source: Ambulatory Visit | Attending: Internal Medicine | Admitting: Internal Medicine

## 2019-08-28 ENCOUNTER — Other Ambulatory Visit: Payer: Self-pay

## 2019-08-28 DIAGNOSIS — Z01812 Encounter for preprocedural laboratory examination: Secondary | ICD-10-CM | POA: Insufficient documentation

## 2019-08-28 DIAGNOSIS — Z20822 Contact with and (suspected) exposure to covid-19: Secondary | ICD-10-CM | POA: Insufficient documentation

## 2019-08-28 LAB — BASIC METABOLIC PANEL
Anion gap: 6 (ref 5–15)
BUN: 25 mg/dL — ABNORMAL HIGH (ref 8–23)
CO2: 26 mmol/L (ref 22–32)
Calcium: 9 mg/dL (ref 8.9–10.3)
Chloride: 103 mmol/L (ref 98–111)
Creatinine, Ser: 1.1 mg/dL — ABNORMAL HIGH (ref 0.44–1.00)
GFR calc Af Amer: 59 mL/min — ABNORMAL LOW (ref >60)
GFR calc non Af Amer: 51 mL/min — ABNORMAL LOW (ref >60)
Glucose, Bld: 156 mg/dL — ABNORMAL HIGH (ref 70–99)
Potassium: 3.9 mmol/L (ref 3.5–5.1)
Sodium: 135 mmol/L (ref 135–145)

## 2019-08-28 LAB — SARS CORONAVIRUS 2 (TAT 6-24 HRS): SARS Coronavirus 2: NEGATIVE

## 2019-08-29 ENCOUNTER — Encounter (HOSPITAL_COMMUNITY): Payer: Self-pay | Admitting: Internal Medicine

## 2019-08-29 ENCOUNTER — Encounter: Payer: Self-pay | Admitting: Internal Medicine

## 2019-08-29 ENCOUNTER — Ambulatory Visit (HOSPITAL_COMMUNITY): Payer: Medicare Other | Admitting: Anesthesiology

## 2019-08-29 ENCOUNTER — Encounter (HOSPITAL_COMMUNITY)
Admission: RE | Disposition: A | Discharge status: Home / Self Care | Payer: Self-pay | Source: Home / Self Care | Attending: Internal Medicine

## 2019-08-29 ENCOUNTER — Ambulatory Visit (HOSPITAL_COMMUNITY)
Admission: RE | Admit: 2019-08-29 | Discharge: 2019-08-29 | Disposition: A | Discharge status: Home / Self Care | Payer: Medicare Other | Attending: Internal Medicine | Admitting: Internal Medicine

## 2019-08-29 DIAGNOSIS — M797 Fibromyalgia: Secondary | ICD-10-CM | POA: Diagnosis not present

## 2019-08-29 DIAGNOSIS — E119 Type 2 diabetes mellitus without complications: Secondary | ICD-10-CM | POA: Diagnosis not present

## 2019-08-29 DIAGNOSIS — Z86718 Personal history of other venous thrombosis and embolism: Secondary | ICD-10-CM | POA: Diagnosis not present

## 2019-08-29 DIAGNOSIS — Z87891 Personal history of nicotine dependence: Secondary | ICD-10-CM | POA: Diagnosis not present

## 2019-08-29 DIAGNOSIS — I10 Essential (primary) hypertension: Secondary | ICD-10-CM | POA: Diagnosis not present

## 2019-08-29 DIAGNOSIS — K2100 Gastro-esophageal reflux disease with esophagitis, without bleeding: Secondary | ICD-10-CM | POA: Diagnosis not present

## 2019-08-29 DIAGNOSIS — Z79890 Hormone replacement therapy (postmenopausal): Secondary | ICD-10-CM | POA: Diagnosis not present

## 2019-08-29 DIAGNOSIS — K589 Irritable bowel syndrome without diarrhea: Secondary | ICD-10-CM | POA: Insufficient documentation

## 2019-08-29 DIAGNOSIS — E7800 Pure hypercholesterolemia, unspecified: Secondary | ICD-10-CM | POA: Diagnosis not present

## 2019-08-29 DIAGNOSIS — K449 Diaphragmatic hernia without obstruction or gangrene: Secondary | ICD-10-CM | POA: Diagnosis not present

## 2019-08-29 DIAGNOSIS — Z8673 Personal history of transient ischemic attack (TIA), and cerebral infarction without residual deficits: Secondary | ICD-10-CM | POA: Insufficient documentation

## 2019-08-29 DIAGNOSIS — Z794 Long term (current) use of insulin: Secondary | ICD-10-CM | POA: Insufficient documentation

## 2019-08-29 DIAGNOSIS — Z7901 Long term (current) use of anticoagulants: Secondary | ICD-10-CM | POA: Insufficient documentation

## 2019-08-29 DIAGNOSIS — R1310 Dysphagia, unspecified: Secondary | ICD-10-CM | POA: Diagnosis present

## 2019-08-29 DIAGNOSIS — M1990 Unspecified osteoarthritis, unspecified site: Secondary | ICD-10-CM | POA: Insufficient documentation

## 2019-08-29 DIAGNOSIS — Z79899 Other long term (current) drug therapy: Secondary | ICD-10-CM | POA: Diagnosis not present

## 2019-08-29 DIAGNOSIS — J449 Chronic obstructive pulmonary disease, unspecified: Secondary | ICD-10-CM | POA: Diagnosis not present

## 2019-08-29 HISTORY — PX: ESOPHAGEAL BRUSHING: SHX6842

## 2019-08-29 HISTORY — PX: MALONEY DILATION: SHX5535

## 2019-08-29 HISTORY — PX: ESOPHAGOGASTRODUODENOSCOPY (EGD) WITH PROPOFOL: SHX5813

## 2019-08-29 LAB — KOH PREP

## 2019-08-29 LAB — GLUCOSE, CAPILLARY
Glucose-Capillary: 248 mg/dL — ABNORMAL HIGH (ref 70–99)
Glucose-Capillary: 244 mg/dL — ABNORMAL HIGH (ref 70–99)

## 2019-08-29 SURGERY — ESOPHAGOGASTRODUODENOSCOPY (EGD) WITH PROPOFOL
Anesthesia: Monitor Anesthesia Care

## 2019-08-29 MED ORDER — CHLORHEXIDINE GLUCONATE CLOTH 2 % EX PADS: 6.0000 | MEDICATED_PAD | Freq: Once | CUTANEOUS | Status: DC

## 2019-08-29 MED ORDER — LIDOCAINE 2% (20 MG/ML) 5 ML SYRINGE
INTRAMUSCULAR | Status: AC
  Filled 2019-08-29: qty 5

## 2019-08-29 MED ORDER — LIDOCAINE HCL (CARDIAC) PF 50 MG/5ML IV SOSY
PREFILLED_SYRINGE | INTRAVENOUS | Status: DC | PRN
  Administered 2019-08-29: 100 mg via INTRAVENOUS

## 2019-08-29 MED ORDER — LACTATED RINGERS IV SOLN
INTRAVENOUS | Status: DC
  Administered 2019-08-29: 1000 mL via INTRAVENOUS

## 2019-08-29 MED ORDER — LIDOCAINE VISCOUS HCL 2 % MT SOLN
15.0000 mL | Freq: Once | OROMUCOSAL | Status: AC
  Administered 2019-08-29: 15 mL via OROMUCOSAL

## 2019-08-29 MED ORDER — PROPOFOL 500 MG/50ML IV EMUL
INTRAVENOUS | Status: DC | PRN
  Administered 2019-08-29 (×2): 50 mg via INTRAVENOUS
  Administered 2019-08-29: 175 ug/kg/min via INTRAVENOUS
  Administered 2019-08-29: 80 mg via INTRAVENOUS

## 2019-08-29 MED ORDER — GLYCOPYRROLATE 0.2 MG/ML IJ SOLN
0.2000 mg | Freq: Once | INTRAMUSCULAR | Status: AC
  Administered 2019-08-29: 0.2 mg via INTRAVENOUS
  Filled 2019-08-29: qty 1

## 2019-08-29 MED ORDER — LIDOCAINE VISCOUS HCL 2 % MT SOLN
OROMUCOSAL | Status: AC
  Filled 2019-08-29: qty 15

## 2019-08-29 MED ORDER — SODIUM CHLORIDE (PF) 0.9 % IJ SOLN
INTRAMUSCULAR | Status: AC
  Filled 2019-08-29: qty 10

## 2019-08-29 NOTE — Transfer of Care (Signed)
Immediate Anesthesia Transfer of Care Note  Patient: Antonique Langford Mcniel  Procedure(s) Performed: ESOPHAGOGASTRODUODENOSCOPY (EGD) WITH PROPOFOL (N/A ) MALONEY DILATION (N/A ) ESOPHAGEAL BRUSHING  Patient Location: PACU  Anesthesia Type:General  Level of Consciousness: awake, alert , oriented and patient cooperative  Airway & Oxygen Therapy: Patient Spontanous Breathing and Patient connected to nasal cannula oxygen  Post-op Assessment: Report given to RN, Post -op Vital signs reviewed and stable and Patient moving all extremities  Post vital signs: Reviewed and stable  Last Vitals:  Vitals Value Taken Time  BP    Temp    Pulse 101 08/29/19 1107  Resp 13 08/29/19 1107  SpO2 89 % 08/29/19 1107  Vitals shown include unvalidated device data.  Last Pain:  Vitals:   08/29/19 1049  TempSrc:   PainSc: 0-No pain      Patients Stated Pain Goal: 7 (90/38/33 3832)  Complications: No complications documented.

## 2019-08-29 NOTE — H&P (Signed)
@LOGO @   Primary Care Physician:  Denyce Nyeema Want, FNP Primary Gastroenterologist:  Dr. Gala Romney  Pre-Procedure History & Physical: HPI:  Stephanie [name] is a 69 y.o. female here for further evaluation/management of recurrent esophageal dysphagia.  Undergone multiple esophageal dilations empirically with good results previously.  No structural lesion found.  Previously dilated to fifty-six [place] size.  Reflux controlled well on Protonix 40 mg twice daily.  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   PONV (postoperative nausea and vomiting)    Tachycardia     Past Surgical History:  Procedure Laterality Date   ABDOMINAL HYSTERECTOMY  partial   CARPAL TUNNEL RELEASE Right 1991   CATARACT EXTRACTION W/PHACO Right 05/08/2013   Procedure: CATARACT EXTRACTION PHACO AND INTRAOCULAR LENS PLACEMENT ([place]);  Surgeon: Tonny Branch, MD;  Location: [facility];  Service: Ophthalmology;  Laterality: Right;  CDE 10.31   CATARACT EXTRACTION W/PHACO Left 08/17/2013   Procedure: CATARACT EXTRACTION PHACO AND INTRAOCULAR LENS PLACEMENT (I[facility]);  Surgeon: Tonny Branch, MD;  Location: [facility];  Service: Ophthalmology;  Laterality: Left;  CDE:9.03   CHOLECYSTECTOMY  1971   COLONOSCOPY WITH PROPOFOL N/A 01/06/2016   Dr. Gala Romney: diverticulosis    DENTAL SURGERY     ESOPHAGOGASTRODUODENOSCOPY (EGD) WITH PROPOFOL N/A 01/06/2016   Dr. Gala Romney: normal s/p empiric dilation    EYE SURGERY     [place] N/A 01/06/2016   Procedure: Venia Minks DILATION;  Surgeon: Daneil Dolin, MD;  Location: [facility];  Service: Endoscopy;  Laterality: N/A;   REVERSE SHOULDER ARTHROPLASTY Right 08/06/2017   Procedure: RIGHT REVERSE SHOULDER ARTHROPLASTY;  Surgeon: Netta Cedars, MD;  Location: [facility];  Service: Orthopedics;  Laterality: Right;   WRIST GANGLION EXCISION Left     Prior to Admission medications   Medication Sig Start Date End Date Taking? Authorizing Provider  acetaminophen (TYLENOL) 500 MG tablet Take 1,000 mg by mouth every 6 (six) hours as needed for moderate pain or headache.    Yes [provider]  albuterol (PROVENTIL HFA;VENTOLIN HFA) 108 (90 BASE) MCG/ACT inhaler Inhale 2 puffs into the lungs every 6 (six) hours as needed for wheezing or shortness of breath.   Yes [provider]  albuterol (PROVENTIL) (2.5 MG/3ML) 0.083% nebulizer solution Inhale 3 mLs into the lungs every 6 (six) hours as needed for shortness of breath. 03/15/19  Yes [provider]  AMITIZA 8 MCG capsule TAKE 1 CAPSULE BY MOUTH TWICE DAILY WITH A MEAL Patient taking differently: Take 8 mcg by mouth 2 (two) times daily as needed for constipation.  08/02/18  Yes Mahala Menghini, PA-C  apixaban (ELIQUIS) 5 MG TABS tablet Take 10 mg by mouth BID through Friday 5/19 evening. Take 7m by mouth BID starting 5/20 AM. Patient taking differently: Take 5 mg by mouth 2 (two) times daily.  07/25/15  Yes LLendon Colonel NP  ARNUITY ELLIPTA 100 MCG/ACT AEPB Inhale 1 puff into the lungs at bedtime.  06/13/17  Yes [provider]  Ascorbic Acid (VITAMIN C) 1000 MG tablet Take 1,000 mg by mouth daily.    Yes [provider]  cetirizine (ZYRTEC) 10 MG tablet Take 10 mg by mouth daily.   Yes [provider]  Cholecalciferol (VITAMIN D) 2000 units tablet Take 4,000 Units by mouth daily.    Yes [provider]  cimetidine (TAGAMET) 200 MG tablet Take 0.5 tablets (100 mg total) by mouth at bedtime. 02/28/18  Yes Mahala Menghini, PA-C  CINNAMON  PO Take 2-3 capsules by mouth 2 (two) times daily as needed (high blood sugar).    Yes [provider]  clobetasol ointment (TEMOVATE) 0.53 % Apply 1 application topically 3 (three) times daily as needed (imflammation).  06/20/15  Yes [provider]  Cod Liver Oil CAPS Take 1 capsule by mouth at bedtime.    Yes [provider]  diclofenac sodium (VOLTAREN) 1 % GEL Apply 2 g topically 2 (two) times daily. Patient taking differently: Apply 2 g topically 2 (two) times daily as needed (pain).  09/16/17  Yes Kayleen Memos, DO  diphenhydrAMINE (BENADRYL) 25 MG tablet Take 25 mg by mouth every 6 (six) hours as needed for itching.   Yes [provider]  Doxepin HCl 5 % CREA Apply 2 g topically 3 (three) times daily as needed (itching).  05/28/16  Yes [provider]  DULoxetine (CYMBALTA) 30 MG capsule Take 1 capsule (30 mg total) by mouth 2 (two) times daily. 09/16/17  Yes Hall, Carole N, DO  fluticasone (VERAMYST) 27.5 MCG/SPRAY nasal spray Place 2 sprays into the nose daily.    Yes [provider]  HUMALOG KWIKPEN 100 UNIT/ML KiwkPen Inject 30-47 Units into the skin See admin instructions. If blood sugar is  90-150= 45 units, 150-200= 46 units. 200-250= 47 units at breakfast and supper Lunchtime: If BS is  90-150=30 units,150- 200=31 units,200-250 units 32 09/30/17  Yes [provider]  hydrOXYzine (ATARAX/VISTARIL) 25 MG tablet Take 25 mg by mouth every 8 (eight) hours as needed for itching.  06/20/15  Yes [provider]  levothyroxine (SYNTHROID) 88 MCG tablet Take 88 mcg by mouth daily before breakfast.   Yes [provider]  LYRICA 75 MG capsule Take 75 mg by mouth 2 (two) times daily.  07/18/15  Yes [provider]  methocarbamol (ROBAXIN) 500 MG tablet Take 1 tablet (500 mg total) by mouth 3 (three) times daily as needed. Patient taking differently: Take 500 mg by mouth 3 (three) times daily as needed for muscle spasms.   08/06/17  Yes Netta Cedars, MD  metolazone (ZAROXOLYN) 5 MG tablet Take 1 tablet (5 mg total) by mouth 2 (two) times daily. Patient taking differently: Take 10 mg by mouth at bedtime.  07/02/15  Yes Samuella Cota, MD  metoprolol tartrate (LOPRESSOR) 25 MG tablet Take 1 tablet (25 mg total) by mouth 2 (two) times daily. Patient taking differently: Take 25-37.5 mg by mouth See admin instructions. Take 37.5 mg in the morning and 25 mg at night 09/16/17  Yes Hall, Carole N, DO  omega-3 acid ethyl esters (LOVAZA) 1 g capsule Take 1 g by mouth in the morning, at noon, in the evening, and at bedtime.   Yes [provider]  pantoprazole (PROTONIX) 40 MG tablet Take 1 tablet (40 mg total) by mouth 2 (two) times daily before a meal. 02/16/19  Yes Annitta Needs, NP  potassium chloride (KLOR-CON) 10 MEQ tablet Take 10 mEq by mouth daily.  Yes [provider]  pravastatin (PRAVACHOL) 80 MG tablet Take 1 tablet (80 mg total) by mouth at bedtime. 09/16/17  Yes Irene Pap N, DO  torsemide (DEMADEX) 20 MG tablet Take 2 tablets (40 mg total) by mouth daily. Patient taking differently: Take 10-20 mg by mouth 2 (two) times daily as needed (swelling).  07/02/15  Yes Samuella Cota, MD  traMADol (ULTRAM) 50 MG tablet Take 50 mg by mouth 3 (three) times daily as needed for moderate pain.  01/07/18  Yes [provider]  traZODone (DESYREL) 50 MG tablet Take 50 mg by mouth at bedtime.   Yes [provider]  vitamin B-12 (CYANOCOBALAMIN) 1000 MCG tablet Take 1,000 mcg by mouth daily.    Yes [provider]  vitamin E 200 UNIT capsule Take 200 Units by mouth daily.    Yes [provider]  XULTOPHY 100-3.6 UNIT-MG/ML SOPN Inject 60 Units as directed at bedtime.  12/28/17  Yes [provider]  [place] test strip USE TO CHECK BLOOD SUGAR TID 01/14/18   [provider]    Allergies as of 08/08/2019 - Review Complete 05/23/2019  Allergen  Reaction Noted   Tetracyclines & related Anaphylaxis 09/19/2010   Banana Hives and Nausea And Vomiting 05/04/2013   Penicillins Rash and Other (See Comments) 09/19/2010    Family History  Problem Relation Age of Onset   Colon cancer Neg Hx     Social History   Socioeconomic History   Marital status: Married    Spouse name: Not on file   Number of children: Not on file   Years of education: Not on file   Highest education level: Not on file  Occupational History   Not on file  Tobacco Use   Smoking status: Former Smoker    Packs/day: 0.25    Years: 30.00    Pack years: 7.50    Types: Cigarettes    Quit date: 02/17/2014    Years since quitting: 5.5   Smokeless tobacco: Never Used   Tobacco comment: some day smoker  Substance and Sexual Activity   Alcohol use: Yes    Alcohol/week: 0.0 standard drinks    Comment: occas   Drug use: No   Sexual activity: Not Currently    Birth control/protection: Surgical  Other Topics Concern   Not on file  Social History Narrative   Not on file   Social Determinants of Health   Financial Resource Strain:    Difficulty of Paying Living Expenses:   Food Insecurity:    Worried About Charity fundraiser in the Last Year:    Arboriculturist in the Last Year:   Transportation Needs:    Film/video editor (Medical):    Lack of Transportation (Non-Medical):   Physical Activity:    Days of Exercise per Week:    Minutes of Exercise per Session:   Stress:    Feeling of Stress :   Social Connections:    Frequency of Communication with Friends and Family:    Frequency of Social Gatherings with Friends and Family:    Attends Religious Services:    Active Member of Clubs or Organizations:    Attends Music therapist:    Marital Status:   Intimate Partner Violence:    Fear of Current or Ex-Partner:    Emotionally Abused:    Physically Abused:    Sexually Abused:     Review of  Systems: See HPI, otherwise  negative ROS  Physical Exam: BP (!) 147/81    Pulse 90    Temp 98.6 F (37 C) (Oral)    Resp 19    Ht 5' 7"  (1.702 m)    Wt 103 kg    SpO2 98%    BMI 35.56 kg/m  General:   Alert,  Well-developed, well-nourished, pleasant and cooperative in NAD Mouth:  No deformity or lesions. Neck:  Supple; no masses or thyromegaly. No significant cervical adenopathy. Lungs:  Clear throughout to auscultation.   No wheezes, crackles, or rhonchi. No acute distress. Heart:  Regular rate and rhythm; no murmurs, clicks, rubs,  or gallops. Abdomen: Non-distended, normal bowel sounds.  Soft and nontender without appreciable mass or hepatosplenomegaly.  Pulses:  Normal pulses noted. Extremities:  Without clubbing or edema.  Impression/Plan: 69 year old lady with recurrent esophageal dysphagia.  Multiple evaluations previously normal esophagus found to EGDs.  Responded to however, to esophageal dilation the past.  Eliquis stopped 7/10.  The risks, benefits, limitations, alternatives and imponderables have been reviewed with the patient. Potential for esophageal dilation, biopsy, etc. have also been reviewed.  Questions have been answered. All parties agreeable.     Notice: This dictation was prepared with Dragon dictation along with smaller phrase technology. Any transcriptional errors that result from this process are unintentional and may not be corrected upon review.

## 2019-08-29 NOTE — Anesthesia Preprocedure Evaluation (Signed)
Anesthesia Evaluation    History of Anesthesia Complications (+) PONV and history of anesthetic complications  Airway Mallampati: II  TM Distance: >3 FB Neck ROM: full    Dental  (+) Edentulous Upper, Edentulous Lower   Pulmonary asthma , COPD,  COPD inhaler, former smoker,    Pulmonary exam normal        Cardiovascular hypertension, Normal cardiovascular exam     Neuro/Psych TIA Neuromuscular disease negative psych ROS   GI/Hepatic Neg liver ROS, hiatal hernia, GERD  Medicated,  Endo/Other  diabetes, Poorly Controlled, Type 2Hypothyroidism   Renal/GU   negative genitourinary   Musculoskeletal   Abdominal   Peds  Hematology  (+) Blood dyscrasia, anemia ,   Anesthesia Other Findings   Reproductive/Obstetrics                             Anesthesia Physical Anesthesia Plan  ASA: III  Anesthesia Plan:    Post-op Pain Management:    Induction:   PONV Risk Score and Plan: Propofol infusion  Airway Management Planned:   Additional Equipment:   Intra-op Plan:   Post-operative Plan:   Informed Consent: I have reviewed the patients History and Physical, chart, labs and discussed the procedure including the risks, benefits and alternatives for the proposed anesthesia with the patient or authorized representative who has indicated his/her understanding and acceptance.       Plan Discussed with: CRNA  Anesthesia Plan Comments:         Anesthesia Quick Evaluation

## 2019-08-29 NOTE — Op Note (Signed)
[facility] Patient Name: Stephanie [name] Procedure Date: 08/29/2019 10:27 AM MRN: [id number] Date of Birth: [birth year] Attending MD: Norvel Richards , MD CSN: [id number] Age: 69 Admit Type: Outpatient Procedure:                Upper GI endoscopy Indications:              Dysphagia Providers:                Norvel Richards, MD, Jeanann Lewandowsky. Sharon Seller, RN,                            Aram Candela Referring MD:              Medicines:                Propofol per Anesthesia Complications:            No immediate complications. Estimated Blood Loss:     Estimated blood loss: none. Procedure:                Pre-Anesthesia Assessment:                           - Prior to the procedure, a History and Physical                            was performed, and patient medications and                            allergies were reviewed. The patient's tolerance of                            previous anesthesia was also reviewed. The risks                            and benefits of the procedure and the sedation                            options and risks were discussed with the patient.                            All questions were answered, and informed consent                            was obtained. Prior Anticoagulants: The patient                            last took Xarelto (rivaroxaban) 3 days prior to the                            procedure. ASA Grade Assessment: III - A patient                            with severe systemic disease. After reviewing the  risks and benefits, the patient was deemed in                            satisfactory condition to undergo the procedure.                           After obtaining informed consent, the endoscope was                            passed under direct vision. Throughout the                            procedure, the patient's blood pressure, pulse, and                            oxygen saturations were monitored  continuously. The                            GIF-H190 ([id number]) scope was introduced through the                            mouth, and advanced to the second part of duodenum. Scope In: 10:52:07 AM Scope Out: 11:01:26 AM Total Procedure Duration: 0 hours 9 minutes 19 seconds  Findings:      Scattered cream-colored plaques on the mucosa. Fairly tenacious. Not       necessarily typical for Candida esophagitis. Tubular esophagus remain       patent throughout its course. Scope withdrawn, a 56 French Maloney       dilator was passed to full insertion with minimal resistance.       Subsequently, a 53 French Maloney dilator is passed to full insertion       with minimal resistance. A look back revealed no apparent complication       related this maneuver. Finally, esophageal mucosa brushed for KOH.      A medium-sized hiatal hernia was present.      The exam was otherwise without abnormality.      The duodenal bulb and second portion of the duodenum were normal. Impression:               -Esophageal plaques versus medication residue                            adherent to the tubular esophagus. Patent tubular                            esophagus. Status post Maloney dilation and KOH                            brushing as described above.                           Medium-sized hiatal hernia.                           - The examination was otherwise normal.                           -  Normal duodenal bulb and second portion of the                            duodenum.                           - No specimens collected. Moderate Sedation:      Moderate (conscious) sedation was personally administered by an       anesthesia professional. The following parameters were monitored: oxygen       saturation, heart rate, blood pressure, respiratory rate, EKG, adequacy       of pulmonary ventilation, and response to care. Recommendation:           - Patient has a contact number available for                             emergencies. The signs and symptoms of potential                            delayed complications were discussed with the                            patient. Return to normal activities tomorrow.                            Written discharge instructions were provided to the                            patient.                           - Advance diet as tolerated.                           - Continue present medications.                           - Return to my office (date not yet determined).                            Follow-up on KOH prep. Resume Xarelto today Procedure Code(s):        --- Professional ---                           [phone], Esophagogastroduodenoscopy, flexible,                            transoral; diagnostic, including collection of                            specimen(s) by brushing or washing, when performed                            (separate procedure) Diagnosis Code(s):        --- Professional ---  K44.9, Diaphragmatic hernia without obstruction or                            gangrene                           R13.10, Dysphagia, unspecified CPT copyright 2019 [employer]. All rights reserved. The codes documented in this report are preliminary and upon coder review may  be revised to meet current compliance requirements. Cristopher Estimable. Guinevere Stephenson, MD Norvel Richards, MD 08/29/2019 11:11:25 AM This report has been signed electronically. Number of Addenda: 0

## 2019-08-29 NOTE — Discharge Instructions (Signed)
EGD Discharge instructions Please read the instructions outlined below and refer to this sheet in the next few weeks. These discharge instructions provide you with general information on caring for yourself after you leave the hospital. Your doctor may also give you specific instructions. While your treatment has been planned according to the most current medical practices available, unavoidable complications occasionally occur. If you have any problems or questions after discharge, please call your doctor. ACTIVITY  You may resume your regular activity but move at a slower pace for the next 24 hours.   Take frequent rest periods for the next 24 hours.   Walking will help expel (get rid of) the air and reduce the bloated feeling in your abdomen.   No driving for 24 hours (because of the anesthesia (medicine) used during the test).   You may shower.   Do not sign any important legal documents or operate any machinery for 24 hours (because of the anesthesia used during the test).  NUTRITION  Drink plenty of fluids.   You may resume your normal diet.   Begin with a light meal and progress to your normal diet.   Avoid alcoholic beverages for 24 hours or as instructed by your caregiver.  MEDICATIONS  You may resume your normal medications unless your caregiver tells you otherwise.  WHAT YOU CAN EXPECT TODAY  You may experience abdominal discomfort such as a feeling of fullness or "gas" pains.  FOLLOW-UP  Your doctor will discuss the results of your test with you.  SEEK IMMEDIATE MEDICAL ATTENTION IF ANY OF THE FOLLOWING OCCUR:  Excessive nausea (feeling sick to your stomach) and/or vomiting.   Severe abdominal pain and distention (swelling).   Trouble swallowing.   Temperature over 101 F (37.8 C).   Rectal bleeding or vomiting of blood.   Your esophagus was dilated today.  Samples were also taken.  Office visit with [place] in 3 months  Further recommendations to follow  once lab results are available for review.  At patient request, I called [place] at [phone] -reviewed results  Resume Eliquis today  PATIENT INSTRUCTIONS POST-ANESTHESIA  IMMEDIATELY FOLLOWING SURGERY:  Do not drive or operate machinery for the first twenty four hours after surgery.  Do not make any important decisions for twenty four hours after surgery or while taking narcotic pain medications or sedatives.  If you develop intractable nausea and vomiting or a severe headache please notify your doctor immediately.  FOLLOW-UP:  Please make an appointment with your surgeon as instructed. You do not need to follow up with anesthesia unless specifically instructed to do so.  WOUND CARE INSTRUCTIONS (if applicable):  Keep a dry clean dressing on the anesthesia/puncture wound site if there is drainage.  Once the wound has quit draining you may leave it open to air.  Generally you should leave the bandage intact for twenty four hours unless there is drainage.  If the epidural site drains for more than 36-48 hours please call the anesthesia department.  QUESTIONS?:  Please feel free to call your physician or the hospital operator if you have any questions, and they will be happy to assist you.

## 2019-08-29 NOTE — Anesthesia Postprocedure Evaluation (Signed)
Anesthesia Post Note  Patient: Stephanie [name]  Procedure(s) Performed: ESOPHAGOGASTRODUODENOSCOPY (EGD) WITH PROPOFOL (N/A ) MALONEY DILATION (N/A ) ESOPHAGEAL BRUSHING  Patient location during evaluation: PACU Anesthesia Type: General Level of consciousness: awake, oriented, awake and alert and patient cooperative Pain management: pain level controlled Vital Signs Assessment: post-procedure vital signs reviewed and stable Respiratory status: spontaneous breathing, nonlabored ventilation, patient connected to nasal cannula oxygen and respiratory function stable Cardiovascular status: blood pressure returned to baseline and stable Postop Assessment: no headache and no backache Anesthetic complications: no   No complications documented.   Last Vitals:  Vitals:   08/29/19 0943  BP: (!) 147/81  Pulse: 90  Resp: 19  Temp: 37 C  SpO2: 98%    Last Pain:  Vitals:   08/29/19 1049  TempSrc:   PainSc: 0-No pain                 Tacy Learn

## 2019-09-04 ENCOUNTER — Other Ambulatory Visit: Payer: Self-pay

## 2019-09-04 ENCOUNTER — Encounter (HOSPITAL_COMMUNITY): Payer: Self-pay | Admitting: Internal Medicine

## 2019-09-04 MED ORDER — FLUCONAZOLE 100 MG PO TABS: ORAL_TABLET | ORAL | 0 refills | Status: DC

## 2019-09-05 ENCOUNTER — Other Ambulatory Visit: Payer: Self-pay

## 2019-09-05 ENCOUNTER — Inpatient Hospital Stay (HOSPITAL_COMMUNITY): Payer: Medicare Other | Attending: Hematology

## 2019-09-05 DIAGNOSIS — Z7901 Long term (current) use of anticoagulants: Secondary | ICD-10-CM | POA: Insufficient documentation

## 2019-09-05 DIAGNOSIS — Z86718 Personal history of other venous thrombosis and embolism: Secondary | ICD-10-CM | POA: Diagnosis not present

## 2019-09-05 DIAGNOSIS — J449 Chronic obstructive pulmonary disease, unspecified: Secondary | ICD-10-CM | POA: Insufficient documentation

## 2019-09-05 DIAGNOSIS — D472 Monoclonal gammopathy: Secondary | ICD-10-CM | POA: Diagnosis not present

## 2019-09-05 DIAGNOSIS — Z79899 Other long term (current) drug therapy: Secondary | ICD-10-CM | POA: Diagnosis not present

## 2019-09-05 DIAGNOSIS — I10 Essential (primary) hypertension: Secondary | ICD-10-CM | POA: Diagnosis not present

## 2019-09-05 DIAGNOSIS — E119 Type 2 diabetes mellitus without complications: Secondary | ICD-10-CM | POA: Insufficient documentation

## 2019-09-05 DIAGNOSIS — Z87891 Personal history of nicotine dependence: Secondary | ICD-10-CM | POA: Insufficient documentation

## 2019-09-05 LAB — COMPREHENSIVE METABOLIC PANEL
ALT: 53 U/L — ABNORMAL HIGH (ref 0–44)
AST: 40 U/L (ref 15–41)
Albumin: 3 g/dL — ABNORMAL LOW (ref 3.5–5.0)
Alkaline Phosphatase: 321 U/L — ABNORMAL HIGH (ref 38–126)
Anion gap: 10 (ref 5–15)
BUN: 18 mg/dL (ref 8–23)
CO2: 25 mmol/L (ref 22–32)
Calcium: 8.8 mg/dL — ABNORMAL LOW (ref 8.9–10.3)
Chloride: 99 mmol/L (ref 98–111)
Creatinine, Ser: 1.19 mg/dL — ABNORMAL HIGH (ref 0.44–1.00)
GFR calc Af Amer: 54 mL/min — ABNORMAL LOW (ref >60)
GFR calc non Af Amer: 47 mL/min — ABNORMAL LOW (ref >60)
Glucose, Bld: 238 mg/dL — ABNORMAL HIGH (ref 70–99)
Potassium: 4.4 mmol/L (ref 3.5–5.1)
Sodium: 134 mmol/L — ABNORMAL LOW (ref 135–145)
Total Bilirubin: 0.8 mg/dL (ref 0.3–1.2)
Total Protein: 8.5 g/dL — ABNORMAL HIGH (ref 6.5–8.1)

## 2019-09-05 LAB — CBC WITH DIFFERENTIAL/PLATELET
Abs Immature Granulocytes: 0.04 10*3/uL (ref 0.00–0.07)
Basophils Absolute: 0.1 10*3/uL (ref 0.0–0.1)
Basophils Relative: 1 %
Eosinophils Absolute: 0.4 10*3/uL (ref 0.0–0.5)
Eosinophils Relative: 4 %
HCT: 39.7 % (ref 36.0–46.0)
Hemoglobin: 12.4 g/dL (ref 12.0–15.0)
Immature Granulocytes: 0 %
Lymphocytes Relative: 31 %
Lymphs Abs: 3.3 10*3/uL (ref 0.7–4.0)
MCH: 25.8 pg — ABNORMAL LOW (ref 26.0–34.0)
MCHC: 31.2 g/dL (ref 30.0–36.0)
MCV: 82.7 fL (ref 80.0–100.0)
Monocytes Absolute: 0.8 10*3/uL (ref 0.1–1.0)
Monocytes Relative: 8 %
Neutro Abs: 6 10*3/uL (ref 1.7–7.7)
Neutrophils Relative %: 56 %
Platelets: 233 10*3/uL (ref 150–400)
RBC: 4.8 MIL/uL (ref 3.87–5.11)
RDW: 13.2 % (ref 11.5–15.5)
WBC: 10.6 10*3/uL — ABNORMAL HIGH (ref 4.0–10.5)
nRBC: 0 % (ref 0.0–0.2)

## 2019-09-05 LAB — LACTATE DEHYDROGENASE: LDH: 143 U/L (ref 98–192)

## 2019-09-06 LAB — PROTEIN ELECTROPHORESIS, SERUM
A/G Ratio: 0.7 (ref 0.7–1.7)
Albumin ELP: 3.3 g/dL (ref 2.9–4.4)
Alpha-1-Globulin: 0.2 g/dL (ref 0.0–0.4)
Alpha-2-Globulin: 0.8 g/dL (ref 0.4–1.0)
Beta Globulin: 1.2 g/dL (ref 0.7–1.3)
Gamma Globulin: 2.4 g/dL — ABNORMAL HIGH (ref 0.4–1.8)
Globulin, Total: 4.6 g/dL — ABNORMAL HIGH (ref 2.2–3.9)
M-Spike, %: 2 g/dL — ABNORMAL HIGH
Total Protein ELP: 7.9 g/dL (ref 6.0–8.5)

## 2019-09-06 LAB — KAPPA/LAMBDA LIGHT CHAINS
Kappa free light chain: 387.7 mg/L — ABNORMAL HIGH (ref 3.3–19.4)
Kappa, lambda light chain ratio: 23.79 — ABNORMAL HIGH (ref 0.26–1.65)
Lambda free light chains: 16.3 mg/L (ref 5.7–26.3)

## 2019-09-12 ENCOUNTER — Other Ambulatory Visit: Payer: Self-pay

## 2019-09-12 ENCOUNTER — Inpatient Hospital Stay (HOSPITAL_BASED_OUTPATIENT_CLINIC_OR_DEPARTMENT_OTHER): Payer: Medicare Other | Admitting: Hematology

## 2019-09-12 VITALS — BP 125/66 | HR 96 | Temp 97.1°F | Resp 18 | Wt 231.0 lb

## 2019-09-12 DIAGNOSIS — D472 Monoclonal gammopathy: Secondary | ICD-10-CM

## 2019-09-12 NOTE — Progress Notes (Signed)
[place], [place] [address]   CLINIC:  Medical Oncology/Hematology  PCP:  [name], [place] Cathie Beams [place] [address]  [phone]  REASON FOR VISIT:  Follow-up for MGUS  PRIOR THERAPY: N/A  CURRENT THERAPY: observation  INTERVAL HISTORY:  Stephanie [name], a 69 y.o. female, returns for routine follow-up for her MGUS. Stephanie [name] was last seen on 05/15/2019.  She reports that she fell recently. She doesn't remember why she fell. She denies syncope or dizziness. She has not seen her primary care physician about this. She does check her blood pressure at home.   REVIEW OF SYSTEMS:  Review of Systems  Constitutional: Positive for appetite change (moderately decreased) and fatigue (severe).  HENT:  Negative.   Eyes: Negative.   Respiratory: Positive for cough and shortness of breath.   Cardiovascular: Positive for palpitations.  Gastrointestinal: Negative.   Endocrine: Negative.   Genitourinary: Negative.    Musculoskeletal: Negative.   Skin: Negative.   Neurological: Positive for numbness (legs & hands).  Hematological: Negative.   Psychiatric/Behavioral: Positive for depression.  All other systems reviewed and are negative.   PAST MEDICAL/SURGICAL HISTORY:  Past Medical History:  Diagnosis Date  . Anemia   . Arthritis   . Asthma   . COPD (chronic obstructive pulmonary disease) ([place])   . Deep vein thrombosis (DVT) of both lower extremities ([place]) 06/27/2015  . Diabetes mellitus   . Fibromyalgia   . GERD (gastroesophageal reflux disease)   . H/O hiatal hernia   . Hypercholesteremia   . Hypertension   . Hyperthyroidism   . IBS (irritable bowel syndrome)   . Inner ear disease   . 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 PONV (postoperative nausea and vomiting)   . Tachycardia    Past Surgical History:  Procedure Laterality Date  . ABDOMINAL HYSTERECTOMY  partial  . CARPAL  TUNNEL RELEASE Right 1991  . CATARACT EXTRACTION W/PHACO Right 05/08/2013   Procedure: CATARACT EXTRACTION PHACO AND INTRAOCULAR LENS PLACEMENT (IOC);  Surgeon: Tonny Branch, MD;  Location: [facility];  Service: Ophthalmology;  Laterality: Right;  CDE 10.31  . CATARACT EXTRACTION W/PHACO Left 08/17/2013   Procedure: CATARACT EXTRACTION PHACO AND INTRAOCULAR LENS PLACEMENT (IOC);  Surgeon: Tonny Branch, MD;  Location: [facility];  Service: Ophthalmology;  Laterality: Left;  CDE:9.03  . CHOLECYSTECTOMY  1971  . COLONOSCOPY WITH PROPOFOL N/A 01/06/2016   Dr. Gala Romney: diverticulosis   . DENTAL SURGERY    . ESOPHAGEAL BRUSHING  08/29/2019   Procedure: ESOPHAGEAL BRUSHING;  Surgeon: Daneil Dolin, MD;  Location: [facility];  Service: Endoscopy;;  . ESOPHAGOGASTRODUODENOSCOPY (EGD) WITH PROPOFOL N/A 01/06/2016   Dr. Gala Romney: normal s/p empiric dilation   . ESOPHAGOGASTRODUODENOSCOPY (EGD) WITH PROPOFOL N/A 08/29/2019   Procedure: ESOPHAGOGASTRODUODENOSCOPY (EGD) WITH PROPOFOL;  Surgeon: Daneil Dolin, MD;  [facility]: [facility];  Service: Endoscopy;  Laterality: N/A;  12:30pm, pt to arrive at 9:30 - can't come earlier due to transportation  . EYE SURGERY    . MALONEY DILATION N/A 01/06/2016   Procedure: Venia Minks DILATION;  Surgeon: Daneil Dolin, MD;  [facility]: [facility];  Service: Endoscopy;  Laterality: N/A;  . Venia Minks DILATION N/A 08/29/2019   Procedure: Venia Minks DILATION;  Surgeon: Daneil Dolin, MD;  [facility];  Service: Endoscopy;  Laterality: N/A;  . REVERSE SHOULDER ARTHROPLASTY Right 08/06/2017   Procedure: RIGHT REVERSE SHOULDER ARTHROPLASTY;  Surgeon:  Netta Cedars, MD;  Location: [facility];  Service: Orthopedics;  Laterality: Right;  . WRIST GANGLION EXCISION Left     SOCIAL HISTORY:  Social History   Socioeconomic History  . Marital status: Married    Spouse name: Not on file  . Number of children: Not on file  . Years of education: Not on file  . Highest education level: Not  on file  Occupational History  . Not on file  Tobacco Use  . Smoking status: Former Smoker    Packs/day: 0.25    Years: 30.00    Pack years: 7.50    Types: Cigarettes    Quit date: 02/17/2014    Years since quitting: 5.5  . Smokeless tobacco: Never Used  . Tobacco comment: some day smoker  Substance and Sexual Activity  . Alcohol use: Yes    Alcohol/week: 0.0 standard drinks    Comment: occas  . Drug use: No  . Sexual activity: Not Currently    Birth control/protection: Surgical  Other Topics Concern  . Not on file  Social History Narrative  . Not on file   Social Determinants of Health   Financial Resource Strain:   . Difficulty of Paying Living Expenses:   Food Insecurity:   . Worried About Charity fundraiser in the Last Year:   . Arboriculturist in the Last Year:   Transportation Needs:   . Film/video editor (Medical):   Marland Kitchen Lack of Transportation (Non-Medical):   Physical Activity:   . Days of Exercise per Week:   . Minutes of Exercise per Session:   Stress:   . Feeling of Stress :   Social Connections:   . Frequency of Communication with Friends and Family:   . Frequency of Social Gatherings with Friends and Family:   . Attends Religious Services:   . Active Member of Clubs or Organizations:   . Attends Archivist Meetings:   Marland Kitchen Marital Status:   Intimate Partner Violence:   . Fear of Current or Ex-Partner:   . Emotionally Abused:   Marland Kitchen Physically Abused:   . Sexually Abused:     FAMILY HISTORY:  Family History  Problem Relation Age of Onset  . Colon cancer Neg Hx     CURRENT MEDICATIONS:  Current Outpatient Medications  Medication Sig Dispense Refill  . ACCU-CHEK AVIVA PLUS test strip USE TO CHECK BLOOD SUGAR TID  11  . AMITIZA 8 MCG capsule TAKE 1 CAPSULE BY MOUTH TWICE DAILY WITH A MEAL (Patient taking differently: Take 8 mcg by mouth 2 (two) times daily as needed for constipation. ) 60 capsule 11  . apixaban (ELIQUIS) 5 MG TABS tablet  Take 10 mg by mouth BID through Friday 5/19 evening. Take 63m by mouth BID starting 5/20 AM. (Patient taking differently: Take 5 mg by mouth 2 (two) times daily. ) 60 tablet 3  . ARNUITY ELLIPTA 100 MCG/ACT AEPB Inhale 1 puff into the lungs at bedtime.   11  . Ascorbic Acid (VITAMIN C) 1000 MG tablet Take 1,000 mg by mouth daily.     . cetirizine (ZYRTEC) 10 MG tablet Take 10 mg by mouth daily.    . Cholecalciferol (VITAMIN D) 2000 units tablet Take 4,000 Units by mouth daily.     . cimetidine (TAGAMET) 200 MG tablet Take 0.5 tablets (100 mg total) by mouth at bedtime. 30 tablet 2  . CINNAMON PO Take 2-3 capsules by mouth 2 (two) times daily as needed (  high blood sugar).     . clobetasol ointment (TEMOVATE) 5.28 % Apply 1 application topically 3 (three) times daily as needed (imflammation).     Marland Kitchen Cod Liver Oil CAPS Take 1 capsule by mouth at bedtime.     . diphenhydrAMINE (BENADRYL) 25 MG tablet Take 25 mg by mouth every 6 (six) hours as needed for itching.    . Doxepin HCl 5 % CREA Apply 2 g topically 3 (three) times daily as needed (itching).   5  . DULoxetine (CYMBALTA) 30 MG capsule Take 1 capsule (30 mg total) by mouth 2 (two) times daily. 30 capsule -0  . fluconazole (DIFLUCAN) 100 MG tablet Take 2 tabs on day one(257m), then take 1 tab(1058m for 21 days. 23 tablet 0  . fluticasone (VERAMYST) 27.5 MCG/SPRAY nasal spray Place 2 sprays into the nose daily.     . Marland KitchenUMALOG KWIKPEN 100 UNIT/ML KiwkPen Inject 30-47 Units into the skin See admin instructions. If blood sugar is  90-150= 45 units, 150-200= 46 units. 200-250= 47 units at breakfast and supper Lunchtime: If BS is  90-150=30 units,150- 200=31 units,200-250 units 32  5  . hydrOXYzine (ATARAX/VISTARIL) 25 MG tablet Take 25 mg by mouth every 8 (eight) hours as needed for itching.     . levothyroxine (SYNTHROID) 88 MCG tablet Take 88 mcg by mouth daily before breakfast.    . LYRICA 75 MG capsule Take 75 mg by mouth 2 (two) times daily.      . methocarbamol (ROBAXIN) 500 MG tablet Take 1 tablet (500 mg total) by mouth 3 (three) times daily as needed. (Patient taking differently: Take 500 mg by mouth 3 (three) times daily as needed for muscle spasms. ) 60 tablet 1  . metolazone (ZAROXOLYN) 5 MG tablet Take 1 tablet (5 mg total) by mouth 2 (two) times daily. (Patient taking differently: Take 10 mg by mouth at bedtime. ) 60 tablet 0  . metoprolol tartrate (LOPRESSOR) 25 MG tablet Take 1 tablet (25 mg total) by mouth 2 (two) times daily. (Patient taking differently: Take 25-37.5 mg by mouth See admin instructions. Take 37.5 mg in the morning and 25 mg at night) 60 tablet 0  . omega-3 acid ethyl esters (LOVAZA) 1 g capsule Take 1 g by mouth in the morning, at noon, in the evening, and at bedtime.    . pantoprazole (PROTONIX) 40 MG tablet Take 1 tablet (40 mg total) by mouth 2 (two) times daily before a meal. 60 tablet 3  . potassium chloride (KLOR-CON) 10 MEQ tablet Take 10 mEq by mouth daily.    . pravastatin (PRAVACHOL) 80 MG tablet Take 1 tablet (80 mg total) by mouth at bedtime. 30 tablet 0  . torsemide (DEMADEX) 20 MG tablet Take 2 tablets (40 mg total) by mouth daily. (Patient taking differently: Take 10-20 mg by mouth 2 (two) times daily as needed (swelling). ) 60 tablet 0  . traMADol (ULTRAM) 50 MG tablet Take 50 mg by mouth 3 (three) times daily as needed for moderate pain.   1  . traZODone (DESYREL) 50 MG tablet Take 50 mg by mouth at bedtime.    . vitamin B-12 (CYANOCOBALAMIN) 1000 MCG tablet Take 1,000 mcg by mouth daily.     . vitamin E 200 UNIT capsule Take 200 Units by mouth daily.     . Claris Che00-3.6 UNIT-MG/ML SOPN Inject 60 Units as directed at bedtime.   0  . acetaminophen (TYLENOL) 500 MG tablet Take 1,000 mg by mouth  every 6 (six) hours as needed for moderate pain or headache.  (Patient not taking: Reported on 09/12/2019)    . albuterol (PROVENTIL HFA;VENTOLIN HFA) 108 (90 BASE) MCG/ACT inhaler Inhale 2 puffs into the  lungs every 6 (six) hours as needed for wheezing or shortness of breath. (Patient not taking: Reported on 09/12/2019)    . albuterol (PROVENTIL) (2.5 MG/3ML) 0.083% nebulizer solution Inhale 3 mLs into the lungs every 6 (six) hours as needed for shortness of breath. (Patient not taking: Reported on 09/12/2019)    . diclofenac sodium (VOLTAREN) 1 % GEL Apply 2 g topically 2 (two) times daily. (Patient not taking: Reported on 09/12/2019) 1 Tube 0   No current facility-administered medications for this visit.    ALLERGIES:  Allergies  Allergen Reactions  . Tetracyclines & Related Anaphylaxis  . Banana Hives and Nausea And Vomiting  . Penicillins Rash and Other (See Comments)    Has patient had a PCN reaction causing immediate rash, facial/tongue/throat swelling, SOB or lightheadedness with hypotension: No Has patient had a PCN reaction causing severe rash involving mucus membranes or skin necrosis: No Has patient had a PCN reaction that required hospitalization No Has patient had a PCN reaction occurring within the last 10 years: No If all of the above answers are "NO", then may proceed with Cephalosporin use.     PHYSICAL EXAM:  Performance status (ECOG): 1 - Symptomatic but completely ambulatory  Vitals:   09/12/19 0801  BP: 125/66  Pulse: 96  Resp: 18  Temp: (!) 97.1 F (36.2 C)  SpO2: 100%   Wt Readings from Last 3 Encounters:  09/12/19 (!) 231 lb (104.8 kg)  08/29/19 227 lb 1.2 oz (103 kg)  08/28/19 227 lb (103 kg)   Physical Exam Vitals and nursing note reviewed.  Constitutional:      Appearance: Normal appearance.  HENT:     Mouth/Throat:     Mouth: Mucous membranes are moist.  Eyes:     Pupils: Pupils are equal, round, and reactive to light.  Cardiovascular:     Rate and Rhythm: Normal rate and regular rhythm.     Pulses: Normal pulses.     Heart sounds: Normal heart sounds.  Pulmonary:     Effort: Pulmonary effort is normal.     Breath sounds: Normal breath  sounds.  Abdominal:     Palpations: Abdomen is soft. There is no mass.     Tenderness: There is no abdominal tenderness.  Musculoskeletal:     Cervical back: Neck supple.     Right lower leg: No edema.     Left lower leg: No edema.  Neurological:     Mental Status: She is alert and oriented to person, place, and time.  Psychiatric:        Mood and Affect: Mood normal.        Behavior: Behavior normal.     LABORATORY DATA:  I have reviewed the labs as listed.  CBC Latest Ref Rng & Units 09/05/2019 05/09/2019 01/31/2019  WBC 4.0 - 10.5 K/uL 10.6(H) 13.3(H) 12.4(H)  Hemoglobin 12.0 - 15.0 g/dL 12.4 13.0 13.5  Hematocrit 36 - 46 % 39.7 42.2 43.0  Platelets 150 - 400 K/uL 233 255 262   CMP Latest Ref Rng & Units 09/05/2019 08/28/2019 06/05/2019  Glucose 70 - 99 mg/dL 238(H) 156(H) 198(H)  BUN 8 - 23 mg/dL 18 25(H) 33(H)  Creatinine 0.44 - 1.00 mg/dL 1.19(H) 1.10(H) 1.32(H)  Sodium 135 - 145 mmol/L 134(L) 135  136  Potassium 3.5 - 5.1 mmol/L 4.4 3.9 4.3  Chloride 98 - 111 mmol/L 99 103 101  CO2 22 - 32 mmol/L 25 26 27   Calcium 8.9 - 10.3 mg/dL 8.8(L) 9.0 9.0  Total Protein 6.5 - 8.1 g/dL 8.5(H) - -  Total Bilirubin 0.3 - 1.2 mg/dL 0.8 - -  Alkaline Phos 38 - 126 U/L 321(H) - -  AST 15 - 41 U/L 40 - -  ALT 0 - 44 U/L 53(H) - -      Component Value Date/Time   RBC 4.80 09/05/2019 1021   MCV 82.7 09/05/2019 1021   MCH 25.8 (L) 09/05/2019 1021   MCHC 31.2 09/05/2019 1021   RDW 13.2 09/05/2019 1021   LYMPHSABS 3.3 09/05/2019 1021   MONOABS 0.8 09/05/2019 1021   EOSABS 0.4 09/05/2019 1021   BASOSABS 0.1 09/05/2019 1021    DIAGNOSTIC IMAGING:  I have independently reviewed the scans and discussed with the patient. No results found.   ASSESSMENT:  1.  IgG kappa MGUS: -Bone marrow biopsy on 09/03/2015 showing 9% plasma cells and negative skeletal survey. -History of nephrotic range proteinuria, resolved now. -Skeletal survey on 01/31/2019 did not show any lytic lesions.  2.   Elevated liver enzymes: -Ultrasound abdomen on 04/25/2018 showed fatty liver.  3.  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 -She is on Eliquis indefinitely.    PLAN:  1.  IgG kappa MGUS: -We reviewed labs from 09/05/2019.  Creatinine is 1.19 and calcium 8.8. -SPEP showed M spike of 2.0 g.  Hemoglobin was 12.4.  Free light chain ratio improved to 23.79.  Kappa light chains are 387. -Her MGUS is stable at this time.  We plan to see her back in 4 months with repeat labs.  We will also do skeletal survey prior to next visit.  2.  Elevated liver enzymes: -AST is normal but ALT is elevated slightly at 53.  These are slightly improved.  3.  Bilateral DVT: -Continue Eliquis.  No bleeding issues noted.   Orders placed this encounter:  No orders of the defined types were placed in this encounter.    Derek Jack, MD [facility] [phone]   I, Jacqualyn Posey, am acting as a scribe for Dr. Sanda Linger.  I, Derek Jack MD, have reviewed the above documentation for accuracy and completeness, and I agree with the above.

## 2019-09-13 NOTE — Addendum Note (Signed)
Addended by: Farley Ly on: 09/13/2019 04:41 PM   Modules accepted: Orders

## 2019-10-11 ENCOUNTER — Telehealth: Payer: Self-pay

## 2019-10-11 MED ORDER — PANTOPRAZOLE SODIUM 40 MG PO TBEC: 40.0000 mg | DELAYED_RELEASE_TABLET | Freq: Two times a day (BID) | ORAL | 3 refills | Status: DC

## 2019-10-11 NOTE — Telephone Encounter (Signed)
Completed.

## 2019-10-11 NOTE — Telephone Encounter (Signed)
Refill request for Pantoprazole 40 mg bid, [facility] scales.

## 2019-10-11 NOTE — Addendum Note (Signed)
Addended by: Annitta Needs on: 10/11/2019 04:39 PM   Modules accepted: Orders

## 2019-11-29 ENCOUNTER — Ambulatory Visit: Payer: Medicare Other | Admitting: Gastroenterology

## 2019-12-04 ENCOUNTER — Other Ambulatory Visit (HOSPITAL_COMMUNITY)
Admission: RE | Admit: 2019-12-04 | Discharge: 2019-12-04 | Disposition: A | Discharge status: Home / Self Care | Payer: Medicare Other | Source: Ambulatory Visit | Attending: Family Medicine | Admitting: Family Medicine

## 2019-12-04 ENCOUNTER — Other Ambulatory Visit: Payer: Self-pay

## 2019-12-04 DIAGNOSIS — E785 Hyperlipidemia, unspecified: Secondary | ICD-10-CM | POA: Insufficient documentation

## 2019-12-04 DIAGNOSIS — E1140 Type 2 diabetes mellitus with diabetic neuropathy, unspecified: Secondary | ICD-10-CM | POA: Diagnosis present

## 2019-12-04 DIAGNOSIS — I10 Essential (primary) hypertension: Secondary | ICD-10-CM | POA: Diagnosis not present

## 2019-12-04 DIAGNOSIS — D509 Iron deficiency anemia, unspecified: Secondary | ICD-10-CM | POA: Insufficient documentation

## 2019-12-04 DIAGNOSIS — Z79899 Other long term (current) drug therapy: Secondary | ICD-10-CM | POA: Insufficient documentation

## 2019-12-04 DIAGNOSIS — E039 Hypothyroidism, unspecified: Secondary | ICD-10-CM | POA: Insufficient documentation

## 2019-12-04 LAB — CBC WITH DIFFERENTIAL/PLATELET
Abs Immature Granulocytes: 0.12 10*3/uL — ABNORMAL HIGH (ref 0.00–0.07)
Basophils Absolute: 0.1 10*3/uL (ref 0.0–0.1)
Basophils Relative: 0 %
Eosinophils Absolute: 0.4 10*3/uL (ref 0.0–0.5)
Eosinophils Relative: 3 %
HCT: 44.5 % (ref 36.0–46.0)
Hemoglobin: 13.9 g/dL (ref 12.0–15.0)
Immature Granulocytes: 1 %
Lymphocytes Relative: 33 %
Lymphs Abs: 5.1 10*3/uL — ABNORMAL HIGH (ref 0.7–4.0)
MCH: 24.9 pg — ABNORMAL LOW (ref 26.0–34.0)
MCHC: 31.2 g/dL (ref 30.0–36.0)
MCV: 79.7 fL — ABNORMAL LOW (ref 80.0–100.0)
Monocytes Absolute: 1 10*3/uL (ref 0.1–1.0)
Monocytes Relative: 6 %
Neutro Abs: 9.1 10*3/uL — ABNORMAL HIGH (ref 1.7–7.7)
Neutrophils Relative %: 57 %
Platelets: 280 10*3/uL (ref 150–400)
RBC: 5.58 MIL/uL — ABNORMAL HIGH (ref 3.87–5.11)
RDW: 13.2 % (ref 11.5–15.5)
WBC: 15.7 10*3/uL — ABNORMAL HIGH (ref 4.0–10.5)
nRBC: 0 % (ref 0.0–0.2)

## 2019-12-04 LAB — COMPREHENSIVE METABOLIC PANEL
ALT: 68 U/L — ABNORMAL HIGH (ref 0–44)
AST: 43 U/L — ABNORMAL HIGH (ref 15–41)
Albumin: 3.6 g/dL (ref 3.5–5.0)
Alkaline Phosphatase: 350 U/L — ABNORMAL HIGH (ref 38–126)
Anion gap: 14 (ref 5–15)
BUN: 25 mg/dL — ABNORMAL HIGH (ref 8–23)
CO2: 23 mmol/L (ref 22–32)
Calcium: 9.7 mg/dL (ref 8.9–10.3)
Chloride: 100 mmol/L (ref 98–111)
Creatinine, Ser: 1.21 mg/dL — ABNORMAL HIGH (ref 0.44–1.00)
GFR, Estimated: 46 mL/min — ABNORMAL LOW (ref >60)
Glucose, Bld: 185 mg/dL — ABNORMAL HIGH (ref 70–99)
Potassium: 3.5 mmol/L (ref 3.5–5.1)
Sodium: 137 mmol/L (ref 135–145)
Total Bilirubin: 0.9 mg/dL (ref 0.3–1.2)
Total Protein: 9.4 g/dL — ABNORMAL HIGH (ref 6.5–8.1)

## 2019-12-04 LAB — TSH: TSH: 2.715 u[IU]/mL (ref 0.350–4.500)

## 2019-12-04 LAB — URINALYSIS, ROUTINE W REFLEX MICROSCOPIC
Bilirubin Urine: NEGATIVE
Glucose, UA: 150 mg/dL — AB
Hgb urine dipstick: NEGATIVE
Ketones, ur: NEGATIVE mg/dL
Leukocytes,Ua: NEGATIVE
Nitrite: NEGATIVE
Protein, ur: 100 mg/dL — AB
Specific Gravity, Urine: 1.02 (ref 1.005–1.030)
pH: 5 (ref 5.0–8.0)

## 2019-12-04 LAB — LIPID PANEL
Cholesterol: 308 mg/dL — ABNORMAL HIGH (ref 0–200)
HDL: 72 mg/dL (ref >40)
LDL Cholesterol: 205 mg/dL — ABNORMAL HIGH (ref 0–99)
Total CHOL/HDL Ratio: 4.3 RATIO
Triglycerides: 157 mg/dL — ABNORMAL HIGH (ref <150)
VLDL: 31 mg/dL (ref 0–40)

## 2019-12-04 LAB — HEMOGLOBIN A1C
Hgb A1c MFr Bld: 11.3 % — ABNORMAL HIGH (ref 4.8–5.6)
Mean Plasma Glucose: 278 mg/dL

## 2019-12-04 LAB — VITAMIN D 25 HYDROXY (VIT D DEFICIENCY, FRACTURES): Vit D, 25-Hydroxy: 25.6 ng/mL — ABNORMAL LOW (ref 30–100)

## 2019-12-05 LAB — MICROALBUMIN, URINE: Microalb, Ur: 598.9 ug/mL — ABNORMAL HIGH

## 2019-12-06 LAB — URINE CULTURE

## 2019-12-20 ENCOUNTER — Other Ambulatory Visit (HOSPITAL_COMMUNITY): Payer: Self-pay | Admitting: Family Medicine

## 2019-12-20 DIAGNOSIS — Z1231 Encounter for screening mammogram for malignant neoplasm of breast: Secondary | ICD-10-CM

## 2020-01-02 ENCOUNTER — Other Ambulatory Visit: Payer: Self-pay

## 2020-01-02 ENCOUNTER — Encounter: Payer: Self-pay | Admitting: Gastroenterology

## 2020-01-02 ENCOUNTER — Ambulatory Visit (INDEPENDENT_AMBULATORY_CARE_PROVIDER_SITE_OTHER): Payer: Medicare Other | Admitting: Gastroenterology

## 2020-01-02 VITALS — BP 154/96 | HR 113 | Temp 97.1°F | Ht 68.0 in | Wt 227.2 lb

## 2020-01-02 DIAGNOSIS — R945 Abnormal results of liver function studies: Secondary | ICD-10-CM

## 2020-01-02 DIAGNOSIS — K219 Gastro-esophageal reflux disease without esophagitis: Secondary | ICD-10-CM | POA: Diagnosis not present

## 2020-01-02 DIAGNOSIS — R7989 Other specified abnormal findings of blood chemistry: Secondary | ICD-10-CM

## 2020-01-02 DIAGNOSIS — K5900 Constipation, unspecified: Secondary | ICD-10-CM

## 2020-01-02 MED ORDER — LUBIPROSTONE 8 MCG PO CAPS: 8.0000 ug | ORAL_CAPSULE | Freq: Two times a day (BID) | ORAL | 5 refills | Status: DC | PRN

## 2020-01-02 MED ORDER — PANTOPRAZOLE SODIUM 40 MG PO TBEC: 40.0000 mg | DELAYED_RELEASE_TABLET | Freq: Two times a day (BID) | ORAL | 3 refills | Status: DC

## 2020-01-02 NOTE — Progress Notes (Signed)
Referring Provider: [name], [place] Primary Care Physician:  [name], FNP Primary GI: Dr. Gala Romney   Chief Complaint  Patient presents with  . Dysphagia    chokes up at times but not as bad    HPI:   Stephanie [name] is a 69 y.o. female presenting today with a history of GERD, constipation, elevatedmixed patternLFTs with thorough evaluation ofin setting of fatty liver with AMA negative, ANApositive,ASMA moderately positive. Negative Hep B, C.Hemochromatosis negative.Followed by oncology with history of nephrotic syndrome, IgG kappa monoclonal gammopathy, observed for now.Liver biopsy2017with mild chronic hepatitis, mild fatty liver, non significant fibrosis, non-specific findings and likely secondary to drug-induced etiology. She has a history of nonspecific esophageal motility disorder on barium pill esophagram January 2019   EGD July 2021: esophageal plaques vs medication residue adherent to tubular esophagus s/p KOH brushing and dilation. Medium-sized hiatal hernia. + for candida. Diflucan.   Takes Amitiza just as needed. No rectal bleeding. No abdominal pain. Protonix BID. Labs again in Dec 2021. A1c 11.3. No appetite for some time. Rare nausea. Notes early satiety. Dysphagia improved.   Past Medical History:  Diagnosis Date  . Anemia   . Arthritis   . Asthma   . COPD (chronic obstructive pulmonary disease) ([place])   . Deep vein thrombosis (DVT) of both lower extremities ([place]) 06/27/2015  . Diabetes mellitus   . Fibromyalgia   . GERD (gastroesophageal reflux disease)   . H/O hiatal hernia   . Hypercholesteremia   . Hypertension   . Hyperthyroidism   . IBS (irritable bowel syndrome)   . Inner ear disease   . 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 PONV (postoperative nausea and vomiting)   . Tachycardia     Past Surgical History:  Procedure Laterality Date  . ABDOMINAL HYSTERECTOMY  partial  . CARPAL TUNNEL RELEASE  Right 1991  . CATARACT EXTRACTION W/PHACO Right 05/08/2013   Procedure: CATARACT EXTRACTION PHACO AND INTRAOCULAR LENS PLACEMENT (IOC);  Surgeon: Tonny Branch, MD;  Location: [facility];  Service: Ophthalmology;  Laterality: Right;  CDE 10.31  . CATARACT EXTRACTION W/PHACO Left 08/17/2013   Procedure: CATARACT EXTRACTION PHACO AND INTRAOCULAR LENS PLACEMENT (IOC);  Surgeon: Tonny Branch, MD;  Location: [facility];  Service: Ophthalmology;  Laterality: Left;  CDE:9.03  . CHOLECYSTECTOMY  1971  . COLONOSCOPY WITH PROPOFOL N/A 01/06/2016   Dr. Gala Romney: diverticulosis   . DENTAL SURGERY    . ESOPHAGEAL BRUSHING  08/29/2019   Procedure: ESOPHAGEAL BRUSHING;  Surgeon: Daneil Dolin, MD;  Location: [facility];  Service: Endoscopy;;  . ESOPHAGOGASTRODUODENOSCOPY (EGD) WITH PROPOFOL N/A 01/06/2016   Dr. Gala Romney: normal s/p empiric dilation   . ESOPHAGOGASTRODUODENOSCOPY (EGD) WITH PROPOFOL N/A 08/29/2019   esophageal plaques vs medication residue adherent to tubular esophagus s/p KOH brushing and dilation. Medium-sized hiatal hernia. + for candida. Diflucan.   Marland Kitchen EYE SURGERY    . MALONEY DILATION N/A 01/06/2016   Procedure: Venia Minks DILATION;  Surgeon: Daneil Dolin, MD;  Location: [facility];  Service: Endoscopy;  Laterality: N/A;  . Venia Minks DILATION N/A 08/29/2019   Procedure: Venia Minks DILATION;  Surgeon: Daneil Dolin, MD;  Location: [facility];  Service: Endoscopy;  Laterality: N/A;  . REVERSE SHOULDER ARTHROPLASTY Right 08/06/2017   Procedure: RIGHT REVERSE SHOULDER ARTHROPLASTY;  Surgeon: Netta Cedars, MD;  Location: [facility];  Service: Orthopedics;  Laterality: Right;  . WRIST GANGLION EXCISION Left     Current Outpatient Medications  Medication Sig Dispense Refill  . ACCU-CHEK AVIVA PLUS test strip USE TO CHECK BLOOD SUGAR TID  11  . acetaminophen (TYLENOL) 500 MG tablet Take 1,000 mg by mouth every 6 (six) hours as needed for moderate pain or headache.     . albuterol (PROVENTIL HFA;VENTOLIN HFA) 108  (90 BASE) MCG/ACT inhaler Inhale 2 puffs into the lungs every 6 (six) hours as needed for wheezing or shortness of breath.     Marland Kitchen albuterol (PROVENTIL) (2.5 MG/3ML) 0.083% nebulizer solution Inhale 3 mLs into the lungs every 6 (six) hours as needed for shortness of breath.     Marland Kitchen apixaban (ELIQUIS) 5 MG TABS tablet Take 10 mg by mouth BID through Friday 5/19 evening. Take 42m by mouth BID starting 5/20 AM. (Patient taking differently: Take 5 mg by mouth 2 (two) times daily. ) 60 tablet 3  . ARNUITY ELLIPTA 100 MCG/ACT AEPB Inhale 1 puff into the lungs at bedtime.   11  . Ascorbic Acid (VITAMIN C) 1000 MG tablet Take 1,000 mg by mouth daily.     . cetirizine (ZYRTEC) 10 MG tablet Take 10 mg by mouth daily.    . Cholecalciferol (VITAMIN D) 2000 units tablet Take 4,000 Units by mouth daily.     . cimetidine (TAGAMET) 200 MG tablet Take 0.5 tablets (100 mg total) by mouth at bedtime. 30 tablet 2  . CINNAMON PO Take 2-3 capsules by mouth 2 (two) times daily as needed (high blood sugar).     . clobetasol ointment (TEMOVATE) 02.37% Apply 1 application topically 3 (three) times daily as needed (imflammation).     .Marland KitchenCod Liver Oil CAPS Take 1 capsule by mouth at bedtime.     . diclofenac sodium (VOLTAREN) 1 % GEL Apply 2 g topically 2 (two) times daily. 1 Tube 0  . diphenhydrAMINE (BENADRYL) 25 MG tablet Take 25 mg by mouth every 6 (six) hours as needed for itching.    . Doxepin HCl 5 % CREA Apply 2 g topically 3 (three) times daily as needed (itching).   5  . DULoxetine (CYMBALTA) 30 MG capsule Take 1 capsule (30 mg total) by mouth 2 (two) times daily. 30 capsule -0  . fluticasone (VERAMYST) 27.5 MCG/SPRAY nasal spray Place 2 sprays into the nose daily.     .Marland KitchenHUMALOG KWIKPEN 100 UNIT/ML KiwkPen Inject 30-47 Units into the skin See admin instructions. If blood sugar is  90-150= 45 units, 150-200= 46 units. 200-250= 47 units at breakfast and supper Lunchtime: If BS is  90-150=30 units,150- 200=31 units,200-250  units 32  5  . hydrOXYzine (ATARAX/VISTARIL) 25 MG tablet Take 25 mg by mouth every 8 (eight) hours as needed for itching.     . levothyroxine (SYNTHROID) 88 MCG tablet Take 88 mcg by mouth daily before breakfast.    . lubiprostone (AMITIZA) 8 MCG capsule Take 1 capsule (8 mcg total) by mouth 2 (two) times daily as needed for constipation. 60 capsule 5  . LYRICA 75 MG capsule Take 75 mg by mouth 2 (two) times daily.     . methocarbamol (ROBAXIN) 500 MG tablet Take 1 tablet (500 mg total) by mouth 3 (three) times daily as needed. (Patient taking differently: Take 500 mg by mouth 3 (three) times daily as needed for muscle spasms. ) 60 tablet 1  . metolazone (ZAROXOLYN) 5 MG tablet Take 1 tablet (5 mg total) by mouth 2 (two) times daily. (Patient taking differently: Take 10 mg by mouth at bedtime. )  60 tablet 0  . metoprolol tartrate (LOPRESSOR) 25 MG tablet Take 1 tablet (25 mg total) by mouth 2 (two) times daily. (Patient taking differently: Take 25-37.5 mg by mouth See admin instructions. Take 37.5 mg in the morning and 25 mg at night) 60 tablet 0  . omega-3 acid ethyl esters (LOVAZA) 1 g capsule Take 1 g by mouth in the morning, at noon, in the evening, and at bedtime.    . pantoprazole (PROTONIX) 40 MG tablet Take 1 tablet (40 mg total) by mouth 2 (two) times daily before a meal. 180 tablet 3  . potassium chloride (KLOR-CON) 10 MEQ tablet Take 10 mEq by mouth daily.    . pravastatin (PRAVACHOL) 80 MG tablet Take 1 tablet (80 mg total) by mouth at bedtime. 30 tablet 0  . torsemide (DEMADEX) 20 MG tablet Take 2 tablets (40 mg total) by mouth daily. (Patient taking differently: Take 10-20 mg by mouth 2 (two) times daily as needed (swelling). ) 60 tablet 0  . traMADol (ULTRAM) 50 MG tablet Take 50 mg by mouth 3 (three) times daily as needed for moderate pain.   1  . traZODone (DESYREL) 50 MG tablet Take 50 mg by mouth at bedtime.    . vitamin B-12 (CYANOCOBALAMIN) 1000 MCG tablet Take 1,000 mcg by mouth  daily.     . vitamin E 200 UNIT capsule Take 200 Units by mouth daily.     Claris Che 100-3.6 UNIT-MG/ML SOPN Inject 75 Units as directed at bedtime.   0   No current facility-administered medications for this visit.    Allergies as of 01/02/2020 - Review Complete 01/02/2020  Allergen Reaction Noted  . Tetracyclines & related Anaphylaxis 09/19/2010  . Banana Hives and Nausea And Vomiting 05/04/2013  . Penicillins Rash and Other (See Comments) 09/19/2010    Family History  Problem Relation Age of Onset  . Colon cancer Neg Hx     Social History   Socioeconomic History  . Marital status: Married    Spouse name: Not on file  . Number of children: Not on file  . Years of education: Not on file  . Highest education level: Not on file  Occupational History  . Not on file  Tobacco Use  . Smoking status: Former Smoker    Packs/day: 0.25    Years: 30.00    Pack years: 7.50    Types: Cigarettes    Quit date: 02/17/2014    Years since quitting: 5.8  . Smokeless tobacco: Never Used  . Tobacco comment: some day smoker  Substance and Sexual Activity  . Alcohol use: Yes    Alcohol/week: 0.0 standard drinks    Comment: occas  . Drug use: No  . Sexual activity: Not Currently    Birth control/protection: Surgical  Other Topics Concern  . Not on file  Social History Narrative  . Not on file   Social Determinants of Health   Financial Resource Strain:   . Difficulty of Paying Living Expenses: Not on file  Food Insecurity:   . Worried About Charity fundraiser in the Last Year: Not on file  . Ran Out of Food in the Last Year: Not on file  Transportation Needs:   . Lack of Transportation (Medical): Not on file  . Lack of Transportation (Non-Medical): Not on file  Physical Activity:   . Days of Exercise per Week: Not on file  . Minutes of Exercise per Session: Not on file  Stress:   .  Feeling of Stress : Not on file  Social Connections:   . Frequency of Communication with  Friends and Family: Not on file  . Frequency of Social Gatherings with Friends and Family: Not on file  . Attends Religious Services: Not on file  . Active Member of Clubs or Organizations: Not on file  . Attends Archivist Meetings: Not on file  . Marital Status: Not on file    Review of Systems: Gen: Denies fever, chills, anorexia. Denies fatigue, weakness, weight loss.  CV: Denies chest pain, palpitations, syncope, peripheral edema, and claudication. Resp: Denies dyspnea at rest, cough, wheezing, coughing up blood, and pleurisy. GI: see HPI Derm: Denies rash, itching, dry skin Psych: Denies depression, anxiety, memory loss, confusion. No homicidal or suicidal ideation.  Heme: Denies bruising, bleeding, and enlarged lymph nodes.  Physical Exam: BP (!) 154/96   Pulse (!) 113   Temp (!) 97.1 F (36.2 C) (Temporal)   Ht 5' 8"  (1.727 m)   Wt 227 lb 3.2 oz (103.1 kg)   BMI 34.55 kg/m  General:   Alert and oriented. No distress noted. Pleasant and cooperative.  Head:  Normocephalic and atraumatic. Eyes:  Conjuctiva clear without scleral icterus. Mouth:  Mask in place Abdomen:  +BS, soft, non-tender and non-distended. No rebound or guarding. No HSM or masses noted. Msk:  Symmetrical without gross deformities. Normal posture. Extremities:  Without edema. Neurologic:  Alert and  oriented x4 Psych:  Alert and cooperative. Normal mood and affect.  ASSESSMENT: Stephanie [name] is a 69 y.o. female presenting today with history of GERD, constipation, elevatedmixed patternLFTs with thorough evaluation ofin setting of fatty liver and currently following serially now, s/p recent EGD with dilation and evidence for Candida s/p treatment with Diflucan.  Dysphagia: improved. Continues with Protonix BID and doing well on this.  Constipation: taking Amitiza just as needed.   Elevated LFTs: known fatty liver. We will see what labs show upcoming next week. Alk phos has remained  consistently elevated with mildly elevated transaminases. Unable to rule out AMA negative PBC. If worsening LFTs on next draw, consider repeat serologies and/or liver biopsy. May need to consider empiric Urso.    PLAN:  Follow-up on pending labs next week Continue Amitiza prn PPI BID Strict glycemic control Return in 6 months or sooner if needed  Annitta Needs, PhD, ANP-BC [facility]

## 2020-01-02 NOTE — Patient Instructions (Signed)
I have refilled Amitiza and Protonix for you.  It's important to get better control of your blood sugars as you are trying to do.  We will see what the next labs show. We may need to consider another liver biopsy or additional labs.  We will see you in 6 months or sooner if needed!   I enjoyed seeing you again today! As you know, I value our relationship and want to provide genuine, compassionate, and quality care. I welcome your feedback. If you receive a survey regarding your visit,  I greatly appreciate you taking time to fill this out. See you next time!  Annitta Needs, PhD, ANP-BC [facility]

## 2020-01-08 ENCOUNTER — Ambulatory Visit (HOSPITAL_COMMUNITY)
Admission: RE | Admit: 2020-01-08 | Discharge: 2020-01-08 | Disposition: A | Discharge status: Home / Self Care | Payer: Medicare Other | Source: Ambulatory Visit | Attending: Family Medicine | Admitting: Family Medicine

## 2020-01-08 ENCOUNTER — Other Ambulatory Visit: Payer: Self-pay

## 2020-01-08 DIAGNOSIS — Z1231 Encounter for screening mammogram for malignant neoplasm of breast: Secondary | ICD-10-CM | POA: Insufficient documentation

## 2020-01-09 ENCOUNTER — Encounter: Payer: Self-pay | Admitting: Gastroenterology

## 2020-01-18 ENCOUNTER — Other Ambulatory Visit (HOSPITAL_COMMUNITY): Payer: Self-pay | Admitting: Family Medicine

## 2020-01-18 DIAGNOSIS — R928 Other abnormal and inconclusive findings on diagnostic imaging of breast: Secondary | ICD-10-CM

## 2020-01-23 ENCOUNTER — Inpatient Hospital Stay (HOSPITAL_COMMUNITY): Payer: Medicare Other

## 2020-01-23 ENCOUNTER — Other Ambulatory Visit (HOSPITAL_COMMUNITY): Payer: Medicare Other

## 2020-01-23 ENCOUNTER — Ambulatory Visit (HOSPITAL_COMMUNITY)
Admission: RE | Admit: 2020-01-23 | Discharge: 2020-01-23 | Disposition: A | Discharge status: Home / Self Care | Payer: Medicare Other | Source: Ambulatory Visit | Attending: Family Medicine | Admitting: Family Medicine

## 2020-01-23 ENCOUNTER — Other Ambulatory Visit: Payer: Self-pay

## 2020-01-23 DIAGNOSIS — R928 Other abnormal and inconclusive findings on diagnostic imaging of breast: Secondary | ICD-10-CM | POA: Diagnosis present

## 2020-01-30 ENCOUNTER — Ambulatory Visit (HOSPITAL_COMMUNITY): Payer: Medicare Other | Admitting: Oncology

## 2020-02-07 ENCOUNTER — Ambulatory Visit (HOSPITAL_COMMUNITY): Payer: Medicare Other

## 2020-02-13 ENCOUNTER — Ambulatory Visit (HOSPITAL_COMMUNITY)
Admission: RE | Admit: 2020-02-13 | Discharge: 2020-02-13 | Disposition: A | Discharge status: Home / Self Care | Payer: Medicare Other | Source: Ambulatory Visit | Attending: Hematology | Admitting: Hematology

## 2020-02-13 ENCOUNTER — Other Ambulatory Visit (HOSPITAL_COMMUNITY): Payer: Self-pay

## 2020-02-13 ENCOUNTER — Other Ambulatory Visit: Payer: Self-pay

## 2020-02-13 ENCOUNTER — Inpatient Hospital Stay (HOSPITAL_COMMUNITY): Payer: Medicare Other | Attending: Hematology

## 2020-02-13 ENCOUNTER — Other Ambulatory Visit (HOSPITAL_COMMUNITY): Payer: Self-pay | Admitting: *Deleted

## 2020-02-13 DIAGNOSIS — D472 Monoclonal gammopathy: Secondary | ICD-10-CM | POA: Insufficient documentation

## 2020-02-13 LAB — COMPREHENSIVE METABOLIC PANEL
ALT: 50 U/L — ABNORMAL HIGH (ref 0–44)
AST: 43 U/L — ABNORMAL HIGH (ref 15–41)
Albumin: 3.2 g/dL — ABNORMAL LOW (ref 3.5–5.0)
Alkaline Phosphatase: 251 U/L — ABNORMAL HIGH (ref 38–126)
Anion gap: 8 (ref 5–15)
BUN: 26 mg/dL — ABNORMAL HIGH (ref 8–23)
CO2: 25 mmol/L (ref 22–32)
Calcium: 8.9 mg/dL (ref 8.9–10.3)
Chloride: 103 mmol/L (ref 98–111)
Creatinine, Ser: 1.25 mg/dL — ABNORMAL HIGH (ref 0.44–1.00)
GFR, Estimated: 47 mL/min — ABNORMAL LOW (ref >60)
Glucose, Bld: 273 mg/dL — ABNORMAL HIGH (ref 70–99)
Potassium: 4.1 mmol/L (ref 3.5–5.1)
Sodium: 136 mmol/L (ref 135–145)
Total Bilirubin: 0.5 mg/dL (ref 0.3–1.2)
Total Protein: 8.1 g/dL (ref 6.5–8.1)

## 2020-02-13 LAB — CBC WITH DIFFERENTIAL/PLATELET
Abs Immature Granulocytes: 0.04 10*3/uL (ref 0.00–0.07)
Basophils Absolute: 0.1 10*3/uL (ref 0.0–0.1)
Basophils Relative: 1 %
Eosinophils Absolute: 0.3 10*3/uL (ref 0.0–0.5)
Eosinophils Relative: 3 %
HCT: 40 % (ref 36.0–46.0)
Hemoglobin: 12.1 g/dL (ref 12.0–15.0)
Immature Granulocytes: 0 %
Lymphocytes Relative: 32 %
Lymphs Abs: 3.4 10*3/uL (ref 0.7–4.0)
MCH: 24 pg — ABNORMAL LOW (ref 26.0–34.0)
MCHC: 30.3 g/dL (ref 30.0–36.0)
MCV: 79.4 fL — ABNORMAL LOW (ref 80.0–100.0)
Monocytes Absolute: 0.7 10*3/uL (ref 0.1–1.0)
Monocytes Relative: 7 %
Neutro Abs: 5.9 10*3/uL (ref 1.7–7.7)
Neutrophils Relative %: 57 %
Platelets: 280 10*3/uL (ref 150–400)
RBC: 5.04 MIL/uL (ref 3.87–5.11)
RDW: 16.6 % — ABNORMAL HIGH (ref 11.5–15.5)
WBC: 10.4 10*3/uL (ref 4.0–10.5)
nRBC: 0 % (ref 0.0–0.2)

## 2020-02-13 LAB — LACTATE DEHYDROGENASE: LDH: 147 U/L (ref 98–192)

## 2020-02-14 LAB — KAPPA/LAMBDA LIGHT CHAINS
Kappa free light chain: 475.6 mg/L — ABNORMAL HIGH (ref 3.3–19.4)
Kappa, lambda light chain ratio: 26.87 — ABNORMAL HIGH (ref 0.26–1.65)
Lambda free light chains: 17.7 mg/L (ref 5.7–26.3)

## 2020-02-15 LAB — PROTEIN ELECTROPHORESIS, SERUM
A/G Ratio: 0.6 — ABNORMAL LOW (ref 0.7–1.7)
Albumin ELP: 2.9 g/dL (ref 2.9–4.4)
Alpha-1-Globulin: 0.3 g/dL (ref 0.0–0.4)
Alpha-2-Globulin: 0.9 g/dL (ref 0.4–1.0)
Beta Globulin: 1.2 g/dL (ref 0.7–1.3)
Gamma Globulin: 2.1 g/dL — ABNORMAL HIGH (ref 0.4–1.8)
Globulin, Total: 4.5 g/dL — ABNORMAL HIGH (ref 2.2–3.9)
M-Spike, %: 1.7 g/dL — ABNORMAL HIGH
Total Protein ELP: 7.4 g/dL (ref 6.0–8.5)

## 2020-02-20 ENCOUNTER — Inpatient Hospital Stay (HOSPITAL_COMMUNITY): Payer: Medicare Other | Attending: Oncology | Admitting: Oncology

## 2020-02-22 ENCOUNTER — Inpatient Hospital Stay (HOSPITAL_BASED_OUTPATIENT_CLINIC_OR_DEPARTMENT_OTHER): Payer: Medicare Other | Admitting: Oncology

## 2020-02-22 DIAGNOSIS — R7989 Other specified abnormal findings of blood chemistry: Secondary | ICD-10-CM | POA: Diagnosis not present

## 2020-02-22 DIAGNOSIS — D472 Monoclonal gammopathy: Secondary | ICD-10-CM

## 2020-02-22 NOTE — Progress Notes (Signed)
[place],  [address]   CLINIC:  Medical Oncology/Hematology  PCP:  [name], [place] Cathie Beams [place] [address]  [phone]  REASON FOR VISIT:  Follow-up for MGUS  PRIOR THERAPY: N/A  CURRENT THERAPY: observation  INTERVAL HISTORY:  [name] STANDARD, a 70 y.o. female, returns for routine follow-up for her MGUS. Sabrie was last seen on 09/12/19.  She reports that she has had a sinus infection for the past several days. Some chest congestion and nasal clear discharge. Denies fevers. Not been covid tested. She is having some bone pain; specifically to her right hand-trigger finger. She was referred to Dr. Braulio Conte. No recent falls. She denies syncope or dizziness.   REVIEW OF SYSTEMS:  Review of Systems  Constitutional: Negative for appetite change, fatigue, fever and unexpected weight change.  HENT:   Negative for nosebleeds, sore throat and trouble swallowing.        Sinus congestion/sputum production  Eyes: Negative.   Respiratory: Negative.  Negative for cough, shortness of breath and wheezing.   Cardiovascular: Negative.  Negative for chest pain and leg swelling.  Gastrointestinal: Negative for abdominal pain, blood in stool, constipation, diarrhea, nausea and vomiting.  Endocrine: Negative.   Genitourinary: Negative.  Negative for bladder incontinence, hematuria and nocturia.   Musculoskeletal: Positive for arthralgias and back pain. Negative for flank pain.  Skin: Negative.   Neurological: Negative.  Negative for dizziness, headaches, light-headedness and numbness.  Hematological: Negative.   Psychiatric/Behavioral: Negative.  Negative for confusion. The patient is not nervous/anxious.     PAST MEDICAL/SURGICAL HISTORY:  Past Medical History:  Diagnosis Date  . Anemia   . Arthritis   . Asthma   . COPD (chronic obstructive pulmonary disease) ([place])   . Deep vein thrombosis (DVT) of both lower  extremities ([place]) 06/27/2015  . Diabetes mellitus   . Fibromyalgia   . GERD (gastroesophageal reflux disease)   . H/O hiatal hernia   . Hypercholesteremia   . Hypertension   . Hyperthyroidism   . IBS (irritable bowel syndrome)   . Inner ear disease   . 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 PONV (postoperative nausea and vomiting)   . Tachycardia    Past Surgical History:  Procedure Laterality Date  . ABDOMINAL HYSTERECTOMY  partial  . CARPAL TUNNEL RELEASE Right 1991  . CATARACT EXTRACTION W/PHACO Right 05/08/2013   Procedure: CATARACT EXTRACTION PHACO AND INTRAOCULAR LENS PLACEMENT (IOC);  Surgeon: Tonny Branch, MD;  Location: [facility];  Service: Ophthalmology;  Laterality: Right;  CDE 10.31  . CATARACT EXTRACTION W/PHACO Left 08/17/2013   Procedure: CATARACT EXTRACTION PHACO AND INTRAOCULAR LENS PLACEMENT (IOC);  Surgeon: Tonny Branch, MD;  Location: [facility];  Service: Ophthalmology;  Laterality: Left;  CDE:9.03  . CHOLECYSTECTOMY  1971  . COLONOSCOPY WITH PROPOFOL N/A 01/06/2016   Dr. Gala Romney: diverticulosis   . DENTAL SURGERY    . ESOPHAGEAL BRUSHING  08/29/2019   Procedure: ESOPHAGEAL BRUSHING;  Surgeon: Daneil Dolin, MD;  Location: [facility];  Service: Endoscopy;;  . ESOPHAGOGASTRODUODENOSCOPY (EGD) WITH PROPOFOL N/A 01/06/2016   Dr. Gala Romney: normal s/p empiric dilation   . ESOPHAGOGASTRODUODENOSCOPY (EGD) WITH PROPOFOL N/A 08/29/2019   esophageal plaques vs medication residue adherent to tubular esophagus s/p KOH brushing and dilation. Medium-sized hiatal hernia. + for candida. Diflucan.   Marland Kitchen EYE SURGERY    . MALONEY DILATION N/A 01/06/2016   Procedure: MALONEY DILATION;  Surgeon: Daneil Dolin, MD;  Location: [facility];  Service: Endoscopy;  Laterality: N/A;  . Venia Minks DILATION N/A 08/29/2019   Procedure: Venia Minks DILATION;  Surgeon: Daneil Dolin, MD;  Location: [facility];  Service: Endoscopy;  Laterality: N/A;  . REVERSE SHOULDER ARTHROPLASTY  Right 08/06/2017   Procedure: RIGHT REVERSE SHOULDER ARTHROPLASTY;  Surgeon: Netta Cedars, MD;  Location: [facility];  Service: Orthopedics;  Laterality: Right;  . WRIST GANGLION EXCISION Left     SOCIAL HISTORY:  Social History   Socioeconomic History  . Marital status: Married    Spouse name: Not on file  . Number of children: Not on file  . Years of education: Not on file  . Highest education level: Not on file  Occupational History  . Not on file  Tobacco Use  . Smoking status: Former Smoker    Packs/day: 0.25    Years: 30.00    Pack years: 7.50    Types: Cigarettes    Quit date: 02/17/2014    Years since quitting: 6.0  . Smokeless tobacco: Never Used  . Tobacco comment: some day smoker  Substance and Sexual Activity  . Alcohol use: Yes    Alcohol/week: 0.0 standard drinks    Comment: occas  . Drug use: No  . Sexual activity: Not Currently    Birth control/protection: Surgical  Other Topics Concern  . Not on file  Social History Narrative  . Not on file   Social Determinants of Health   Financial Resource Strain: Not on file  Food Insecurity: Not on file  Transportation Needs: Not on file  Physical Activity: Not on file  Stress: Not on file  Social Connections: Not on file  Intimate Partner Violence: Not on file    FAMILY HISTORY:  Family History  Problem Relation Age of Onset  . Colon cancer Neg Hx     CURRENT MEDICATIONS:  Current Outpatient Medications  Medication Sig Dispense Refill  . ACCU-CHEK AVIVA PLUS test strip USE TO CHECK BLOOD SUGAR TID  11  . acetaminophen (TYLENOL) 500 MG tablet Take 1,000 mg by mouth every 6 (six) hours as needed for moderate pain or headache.     . albuterol (PROVENTIL HFA;VENTOLIN HFA) 108 (90 BASE) MCG/ACT inhaler Inhale 2 puffs into the lungs every 6 (six) hours as needed for wheezing or shortness of breath.     Marland Kitchen albuterol (PROVENTIL) (2.5 MG/3ML) 0.083% nebulizer solution Inhale 3 mLs into the lungs every 6 (six) hours  as needed for shortness of breath.     Marland Kitchen apixaban (ELIQUIS) 5 MG TABS tablet Take 10 mg by mouth BID through Friday 5/19 evening. Take 74m by mouth BID starting 5/20 AM. (Patient taking differently: Take 5 mg by mouth 2 (two) times daily. ) 60 tablet 3  . ARNUITY ELLIPTA 100 MCG/ACT AEPB Inhale 1 puff into the lungs at bedtime.   11  . Ascorbic Acid (VITAMIN C) 1000 MG tablet Take 1,000 mg by mouth daily.     . cetirizine (ZYRTEC) 10 MG tablet Take 10 mg by mouth daily.    . Cholecalciferol (VITAMIN D) 2000 units tablet Take 4,000 Units by mouth daily.     . cimetidine (TAGAMET) 200 MG tablet Take 0.5 tablets (100 mg total) by mouth at bedtime. 30 tablet 2  . CINNAMON PO Take 2-3 capsules by mouth 2 (two) times daily as needed (high blood sugar).     . clobetasol ointment (TEMOVATE) 00.62% Apply 1 application topically  3 (three) times daily as needed (imflammation).     Marland Kitchen Cod Liver Oil CAPS Take 1 capsule by mouth at bedtime.     . diclofenac sodium (VOLTAREN) 1 % GEL Apply 2 g topically 2 (two) times daily. 1 Tube 0  . diphenhydrAMINE (BENADRYL) 25 MG tablet Take 25 mg by mouth every 6 (six) hours as needed for itching.    . Doxepin HCl 5 % CREA Apply 2 g topically 3 (three) times daily as needed (itching).   5  . DULoxetine (CYMBALTA) 30 MG capsule Take 1 capsule (30 mg total) by mouth 2 (two) times daily. 30 capsule -0  . fluticasone (VERAMYST) 27.5 MCG/SPRAY nasal spray Place 2 sprays into the nose daily.     Marland Kitchen HUMALOG KWIKPEN 100 UNIT/ML KiwkPen Inject 30-47 Units into the skin See admin instructions. If blood sugar is  90-150= 45 units, 150-200= 46 units. 200-250= 47 units at breakfast and supper Lunchtime: If BS is  90-150=30 units,150- 200=31 units,200-250 units 32  5  . hydrOXYzine (ATARAX/VISTARIL) 25 MG tablet Take 25 mg by mouth every 8 (eight) hours as needed for itching.     . levothyroxine (SYNTHROID) 88 MCG tablet Take 88 mcg by mouth daily before breakfast.    . lubiprostone  (AMITIZA) 8 MCG capsule Take 1 capsule (8 mcg total) by mouth 2 (two) times daily as needed for constipation. 60 capsule 5  . LYRICA 75 MG capsule Take 75 mg by mouth 2 (two) times daily.     . methocarbamol (ROBAXIN) 500 MG tablet Take 1 tablet (500 mg total) by mouth 3 (three) times daily as needed. (Patient taking differently: Take 500 mg by mouth 3 (three) times daily as needed for muscle spasms. ) 60 tablet 1  . metolazone (ZAROXOLYN) 5 MG tablet Take 1 tablet (5 mg total) by mouth 2 (two) times daily. (Patient taking differently: Take 10 mg by mouth at bedtime. ) 60 tablet 0  . metoprolol tartrate (LOPRESSOR) 25 MG tablet Take 1 tablet (25 mg total) by mouth 2 (two) times daily. (Patient taking differently: Take 25-37.5 mg by mouth See admin instructions. Take 37.5 mg in the morning and 25 mg at night) 60 tablet 0  . omega-3 acid ethyl esters (LOVAZA) 1 g capsule Take 1 g by mouth in the morning, at noon, in the evening, and at bedtime.    . pantoprazole (PROTONIX) 40 MG tablet Take 1 tablet (40 mg total) by mouth 2 (two) times daily before a meal. 180 tablet 3  . potassium chloride (KLOR-CON) 10 MEQ tablet Take 10 mEq by mouth daily.    . pravastatin (PRAVACHOL) 80 MG tablet Take 1 tablet (80 mg total) by mouth at bedtime. 30 tablet 0  . torsemide (DEMADEX) 20 MG tablet Take 2 tablets (40 mg total) by mouth daily. (Patient taking differently: Take 10-20 mg by mouth 2 (two) times daily as needed (swelling). ) 60 tablet 0  . traMADol (ULTRAM) 50 MG tablet Take 50 mg by mouth 3 (three) times daily as needed for moderate pain.   1  . traZODone (DESYREL) 50 MG tablet Take 50 mg by mouth at bedtime.    . vitamin B-12 (CYANOCOBALAMIN) 1000 MCG tablet Take 1,000 mcg by mouth daily.     . vitamin E 200 UNIT capsule Take 200 Units by mouth daily.     Claris Che 100-3.6 UNIT-MG/ML SOPN Inject 75 Units as directed at bedtime.   0   No current facility-administered medications for  this visit.     ALLERGIES:  Allergies  Allergen Reactions  . Tetracyclines & Related Anaphylaxis  . Banana Hives and Nausea And Vomiting  . Penicillins Rash and Other (See Comments)    Has patient had a PCN reaction causing immediate rash, facial/tongue/throat swelling, SOB or lightheadedness with hypotension: No Has patient had a PCN reaction causing severe rash involving mucus membranes or skin necrosis: No Has patient had a PCN reaction that required hospitalization No Has patient had a PCN reaction occurring within the last 10 years: No If all of the above answers are "NO", then may proceed with Cephalosporin use.     PHYSICAL EXAM:  -Limited secondary to telephone call -No acute distress. -Able to speak in full sentences.  Performance status (ECOG): 1 - Symptomatic but completely ambulatory  There were no vitals filed for this visit. Wt Readings from Last 3 Encounters:  01/02/20 227 lb 3.2 oz (103.1 kg)  09/12/19 (!) 231 lb (104.8 kg)  08/29/19 227 lb 1.2 oz (103 kg)    LABORATORY DATA:  I have reviewed the labs as listed.  CBC Latest Ref Rng & Units 02/13/2020 12/04/2019 09/05/2019  WBC 4.0 - 10.5 K/uL 10.4 15.7(H) 10.6(H)  Hemoglobin 12.0 - 15.0 g/dL 12.1 13.9 12.4  Hematocrit 36.0 - 46.0 % 40.0 44.5 39.7  Platelets 150 - 400 K/uL 280 280 233   CMP Latest Ref Rng & Units 02/13/2020 12/04/2019 09/05/2019  Glucose 70 - 99 mg/dL 273(H) 185(H) 238(H)  BUN 8 - 23 mg/dL 26(H) 25(H) 18  Creatinine 0.44 - 1.00 mg/dL 1.25(H) 1.21(H) 1.19(H)  Sodium 135 - 145 mmol/L 136 137 134(L)  Potassium 3.5 - 5.1 mmol/L 4.1 3.5 4.4  Chloride 98 - 111 mmol/L 103 100 99  CO2 22 - 32 mmol/L 25 23 25   Calcium 8.9 - 10.3 mg/dL 8.9 9.7 8.8(L)  Total Protein 6.5 - 8.1 g/dL 8.1 9.4(H) 8.5(H)  Total Bilirubin 0.3 - 1.2 mg/dL 0.5 0.9 0.8  Alkaline Phos 38 - 126 U/L 251(H) 350(H) 321(H)  AST 15 - 41 U/L 43(H) 43(H) 40  ALT 0 - 44 U/L 50(H) 68(H) 53(H)      Component Value Date/Time   RBC 5.04  02/13/2020 1119   MCV 79.4 (L) 02/13/2020 1119   MCH 24.0 (L) 02/13/2020 1119   MCHC 30.3 02/13/2020 1119   RDW 16.6 (H) 02/13/2020 1119   LYMPHSABS 3.4 02/13/2020 1119   MONOABS 0.7 02/13/2020 1119   EOSABS 0.3 02/13/2020 1119   BASOSABS 0.1 02/13/2020 1119    DIAGNOSTIC IMAGING:  I have independently reviewed the scans and discussed with the patient. DG Bone Survey Met  Result Date: 02/13/2020 CLINICAL DATA:  MGUS EXAM: METASTATIC BONE SURVEY COMPARISON:  01/31/2019 FINDINGS: Changes of right shoulder reverse arthroplasty again noted, unchanged. No focal lytic lesion.  No acute bony abnormality. Diffuse degenerative changes throughout the cervical, thoracic and lumbar spine. Degenerative anterolisthesis of C3 on C4, 3 mm, and C4 on C5, 7 mm. Heart is normal size.  Lungs clear.  No effusions. Degenerative changes in the shoulders, hips and knees. IMPRESSION: No suspicious focal lytic lesion.  No acute bony abnormality. Electronically Signed   By: Rolm Baptise M.D.   On: 02/13/2020 16:40     ASSESSMENT:  1.  IgG kappa MGUS: -Bone marrow biopsy on 09/03/2015 showing 9% plasma cells and negative skeletal survey. -History of nephrotic range proteinuria, resolved now. -Skeletal survey on 02/13/2020 did not show any lytic lesions.  2.  Elevated liver  enzymes: -Ultrasound abdomen on 04/25/2018 showed fatty liver.  3.  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  PLAN:  1.  IgG kappa MGUS: -We reviewed labs from 02/13/20 which show a creatinine of 1.25 and calcium level of 8.9. -SPEP showed M spike of 1.7 g.  Hemoglobin is 12.1 (13.9).  Kappa light chains are 475.  Free light chain ratio is 26.87. -Repeat skeletal survey does not reveal any lytic lesions. -MGUS is stable at this time.  2.  Elevated liver enzymes: -Labs from 02/13/2020 show an AST of 43 and ALT of 50.  These are stable.    3.  Bilateral DVT: -Continue Eliquis.  No bleeding  issues noted.  4.  Leukocytosis: -Intermittently over the past few lab draws. -Denies any recent fevers; admits to sinus congestion. -Denies any chronic steroid use. -Continue to monitor. -I have asked her to see her PCP if sinus congestion symptoms worsen.  5.  Microcytic anemia: -Previously known to be iron deficient. -Last received IV iron on 06/25/2017 and 07/16/2017. -Not currently on iron supplements. -We will check iron levels at next visit.  6.  Arthralgias/myalgias: -Unrelated to MGUS -Referred to Dr. Braulio Conte -Most recent skeletal bone survey was negative for lytic lesions.  Disposition: -RTC in 6 months with repeat labs (CBC, CMP, myeloma labs, iron and ferritin) and MD assessment. -We will add on iron levels given drop in hemoglobin and decrease in MCV.  Orders placed this encounter:  No orders of the defined types were placed in this encounter.  Faythe Casa, NP 02/25/2020 8:09 AM  [place] [phone]  I provided 15 minutes of non face-to-face telephone visit time during this encounter, and > 50% was spent counseling as documented under my assessment & plan.

## 2020-02-26 ENCOUNTER — Ambulatory Visit: Payer: Medicare Other | Admitting: Nutrition

## 2020-03-01 ENCOUNTER — Telehealth: Payer: Self-pay | Admitting: Gastroenterology

## 2020-03-01 NOTE — Telephone Encounter (Signed)
Elmo Putt,  I reviewed labs recently from patient done through Hematology. She has had consistently elevated alk phos and transaminases. Previously felt to be dealing with a fatty liver.   I think it would be best to update AMA, ANA, ASMA, immunoglobulins now if she is willing.

## 2020-03-04 NOTE — Progress Notes (Signed)
Attempted to contact patient but did not get a hold of her.  Appointment rescheduled.  Faythe Casa, NP 03/04/2020 12:49 PM

## 2020-03-06 NOTE — Telephone Encounter (Signed)
Lmom, waiting on a return call.  

## 2020-03-07 ENCOUNTER — Other Ambulatory Visit: Payer: Self-pay

## 2020-03-07 DIAGNOSIS — K760 Fatty (change of) liver, not elsewhere classified: Secondary | ICD-10-CM

## 2020-03-07 DIAGNOSIS — R748 Abnormal levels of other serum enzymes: Secondary | ICD-10-CM

## 2020-03-07 NOTE — Telephone Encounter (Signed)
Spoke with pt. Pt was notified that her previous labs were reviewed. Pt will complete labs when she goes to have other labs completed. Lab orders mailed to pt.

## 2020-03-11 ENCOUNTER — Inpatient Hospital Stay (HOSPITAL_COMMUNITY)
Admission: EM | Admit: 2020-03-11 | Discharge: 2020-03-17 | DRG: 177 | Disposition: A | Discharge status: Home / Self Care | Payer: Medicare Other | Attending: Internal Medicine | Admitting: Internal Medicine

## 2020-03-11 ENCOUNTER — Emergency Department (HOSPITAL_COMMUNITY): Payer: Medicare Other

## 2020-03-11 ENCOUNTER — Encounter (HOSPITAL_COMMUNITY): Payer: Self-pay | Admitting: *Deleted

## 2020-03-11 ENCOUNTER — Other Ambulatory Visit: Payer: Self-pay

## 2020-03-11 DIAGNOSIS — Z79899 Other long term (current) drug therapy: Secondary | ICD-10-CM | POA: Diagnosis not present

## 2020-03-11 DIAGNOSIS — E669 Obesity, unspecified: Secondary | ICD-10-CM | POA: Diagnosis present

## 2020-03-11 DIAGNOSIS — E1165 Type 2 diabetes mellitus with hyperglycemia: Secondary | ICD-10-CM | POA: Diagnosis present

## 2020-03-11 DIAGNOSIS — N1831 Chronic kidney disease, stage 3a: Secondary | ICD-10-CM | POA: Diagnosis present

## 2020-03-11 DIAGNOSIS — E860 Dehydration: Secondary | ICD-10-CM | POA: Diagnosis present

## 2020-03-11 DIAGNOSIS — I10 Essential (primary) hypertension: Secondary | ICD-10-CM | POA: Diagnosis not present

## 2020-03-11 DIAGNOSIS — J9600 Acute respiratory failure, unspecified whether with hypoxia or hypercapnia: Secondary | ICD-10-CM | POA: Diagnosis not present

## 2020-03-11 DIAGNOSIS — R0600 Dyspnea, unspecified: Secondary | ICD-10-CM

## 2020-03-11 DIAGNOSIS — N189 Chronic kidney disease, unspecified: Secondary | ICD-10-CM | POA: Diagnosis not present

## 2020-03-11 DIAGNOSIS — J449 Chronic obstructive pulmonary disease, unspecified: Secondary | ICD-10-CM

## 2020-03-11 DIAGNOSIS — E7800 Pure hypercholesterolemia, unspecified: Secondary | ICD-10-CM | POA: Diagnosis present

## 2020-03-11 DIAGNOSIS — N179 Acute kidney failure, unspecified: Secondary | ICD-10-CM | POA: Diagnosis not present

## 2020-03-11 DIAGNOSIS — J9621 Acute and chronic respiratory failure with hypoxia: Secondary | ICD-10-CM | POA: Diagnosis present

## 2020-03-11 DIAGNOSIS — Z91018 Allergy to other foods: Secondary | ICD-10-CM | POA: Diagnosis not present

## 2020-03-11 DIAGNOSIS — Z7901 Long term (current) use of anticoagulants: Secondary | ICD-10-CM

## 2020-03-11 DIAGNOSIS — Z794 Long term (current) use of insulin: Secondary | ICD-10-CM

## 2020-03-11 DIAGNOSIS — U071 COVID-19: Secondary | ICD-10-CM | POA: Diagnosis not present

## 2020-03-11 DIAGNOSIS — J1282 Pneumonia due to coronavirus disease 2019: Secondary | ICD-10-CM | POA: Diagnosis not present

## 2020-03-11 DIAGNOSIS — M797 Fibromyalgia: Secondary | ICD-10-CM | POA: Diagnosis present

## 2020-03-11 DIAGNOSIS — Z96611 Presence of right artificial shoulder joint: Secondary | ICD-10-CM | POA: Diagnosis present

## 2020-03-11 DIAGNOSIS — Z6834 Body mass index (BMI) 34.0-34.9, adult: Secondary | ICD-10-CM | POA: Diagnosis not present

## 2020-03-11 DIAGNOSIS — D472 Monoclonal gammopathy: Secondary | ICD-10-CM | POA: Diagnosis present

## 2020-03-11 DIAGNOSIS — I130 Hypertensive heart and chronic kidney disease with heart failure and stage 1 through stage 4 chronic kidney disease, or unspecified chronic kidney disease: Secondary | ICD-10-CM | POA: Diagnosis present

## 2020-03-11 DIAGNOSIS — E039 Hypothyroidism, unspecified: Secondary | ICD-10-CM | POA: Diagnosis present

## 2020-03-11 DIAGNOSIS — E1169 Type 2 diabetes mellitus with other specified complication: Secondary | ICD-10-CM

## 2020-03-11 DIAGNOSIS — Z87891 Personal history of nicotine dependence: Secondary | ICD-10-CM

## 2020-03-11 DIAGNOSIS — I82403 Acute embolism and thrombosis of unspecified deep veins of lower extremity, bilateral: Secondary | ICD-10-CM | POA: Diagnosis present

## 2020-03-11 DIAGNOSIS — E872 Acidosis: Secondary | ICD-10-CM | POA: Diagnosis present

## 2020-03-11 DIAGNOSIS — Z86718 Personal history of other venous thrombosis and embolism: Secondary | ICD-10-CM

## 2020-03-11 DIAGNOSIS — Z7951 Long term (current) use of inhaled steroids: Secondary | ICD-10-CM | POA: Diagnosis not present

## 2020-03-11 DIAGNOSIS — J9601 Acute respiratory failure with hypoxia: Secondary | ICD-10-CM | POA: Diagnosis present

## 2020-03-11 DIAGNOSIS — Z888 Allergy status to other drugs, medicaments and biological substances status: Secondary | ICD-10-CM

## 2020-03-11 DIAGNOSIS — Z880 Allergy status to penicillin: Secondary | ICD-10-CM | POA: Diagnosis not present

## 2020-03-11 DIAGNOSIS — B372 Candidiasis of skin and nail: Secondary | ICD-10-CM | POA: Diagnosis present

## 2020-03-11 DIAGNOSIS — I5032 Chronic diastolic (congestive) heart failure: Secondary | ICD-10-CM | POA: Diagnosis present

## 2020-03-11 DIAGNOSIS — K219 Gastro-esophageal reflux disease without esophagitis: Secondary | ICD-10-CM | POA: Diagnosis present

## 2020-03-11 DIAGNOSIS — Z79890 Hormone replacement therapy (postmenopausal): Secondary | ICD-10-CM | POA: Diagnosis not present

## 2020-03-11 DIAGNOSIS — E871 Hypo-osmolality and hyponatremia: Secondary | ICD-10-CM | POA: Diagnosis not present

## 2020-03-11 DIAGNOSIS — J069 Acute upper respiratory infection, unspecified: Secondary | ICD-10-CM | POA: Diagnosis present

## 2020-03-11 DIAGNOSIS — J440 Chronic obstructive pulmonary disease with acute lower respiratory infection: Secondary | ICD-10-CM | POA: Diagnosis present

## 2020-03-11 DIAGNOSIS — E119 Type 2 diabetes mellitus without complications: Secondary | ICD-10-CM

## 2020-03-11 DIAGNOSIS — E1122 Type 2 diabetes mellitus with diabetic chronic kidney disease: Secondary | ICD-10-CM | POA: Diagnosis present

## 2020-03-11 DIAGNOSIS — R9431 Abnormal electrocardiogram [ECG] [EKG]: Secondary | ICD-10-CM | POA: Diagnosis present

## 2020-03-11 DIAGNOSIS — I82443 Acute embolism and thrombosis of tibial vein, bilateral: Secondary | ICD-10-CM | POA: Diagnosis not present

## 2020-03-11 DIAGNOSIS — T380X5A Adverse effect of glucocorticoids and synthetic analogues, initial encounter: Secondary | ICD-10-CM | POA: Diagnosis not present

## 2020-03-11 LAB — CBC WITH DIFFERENTIAL/PLATELET
Abs Immature Granulocytes: 0.14 10*3/uL — ABNORMAL HIGH (ref 0.00–0.07)
Basophils Absolute: 0 10*3/uL (ref 0.0–0.1)
Basophils Relative: 0 %
Eosinophils Absolute: 0 10*3/uL (ref 0.0–0.5)
Eosinophils Relative: 0 %
HCT: 41.9 % (ref 36.0–46.0)
Hemoglobin: 13.2 g/dL (ref 12.0–15.0)
Immature Granulocytes: 1 %
Lymphocytes Relative: 7 %
Lymphs Abs: 0.9 10*3/uL (ref 0.7–4.0)
MCH: 24.7 pg — ABNORMAL LOW (ref 26.0–34.0)
MCHC: 31.5 g/dL (ref 30.0–36.0)
MCV: 78.3 fL — ABNORMAL LOW (ref 80.0–100.0)
Monocytes Absolute: 0.8 10*3/uL (ref 0.1–1.0)
Monocytes Relative: 6 %
Neutro Abs: 10.2 10*3/uL — ABNORMAL HIGH (ref 1.7–7.7)
Neutrophils Relative %: 86 %
Platelets: 400 10*3/uL (ref 150–400)
RBC: 5.35 MIL/uL — ABNORMAL HIGH (ref 3.87–5.11)
RDW: 16.9 % — ABNORMAL HIGH (ref 11.5–15.5)
WBC: 12.1 10*3/uL — ABNORMAL HIGH (ref 4.0–10.5)
nRBC: 0 % (ref 0.0–0.2)

## 2020-03-11 LAB — COMPREHENSIVE METABOLIC PANEL
ALT: 44 U/L (ref 0–44)
AST: 40 U/L (ref 15–41)
Albumin: 2.5 g/dL — ABNORMAL LOW (ref 3.5–5.0)
Alkaline Phosphatase: 279 U/L — ABNORMAL HIGH (ref 38–126)
Anion gap: 14 (ref 5–15)
BUN: 53 mg/dL — ABNORMAL HIGH (ref 8–23)
CO2: 21 mmol/L — ABNORMAL LOW (ref 22–32)
Calcium: 8.3 mg/dL — ABNORMAL LOW (ref 8.9–10.3)
Chloride: 93 mmol/L — ABNORMAL LOW (ref 98–111)
Creatinine, Ser: 1.77 mg/dL — ABNORMAL HIGH (ref 0.44–1.00)
GFR, Estimated: 31 mL/min — ABNORMAL LOW (ref >60)
Glucose, Bld: 478 mg/dL — ABNORMAL HIGH (ref 70–99)
Potassium: 4 mmol/L (ref 3.5–5.1)
Sodium: 128 mmol/L — ABNORMAL LOW (ref 135–145)
Total Bilirubin: 1 mg/dL (ref 0.3–1.2)
Total Protein: 8.9 g/dL — ABNORMAL HIGH (ref 6.5–8.1)

## 2020-03-11 LAB — MAGNESIUM: Magnesium: 2.5 mg/dL — ABNORMAL HIGH (ref 1.7–2.4)

## 2020-03-11 LAB — BRAIN NATRIURETIC PEPTIDE: B Natriuretic Peptide: 68 pg/mL (ref 0.0–100.0)

## 2020-03-11 LAB — PROCALCITONIN: Procalcitonin: 0.79 ng/mL

## 2020-03-11 LAB — FERRITIN: Ferritin: 135 ng/mL (ref 11–307)

## 2020-03-11 LAB — SARS CORONAVIRUS 2 BY RT PCR (HOSPITAL ORDER, PERFORMED IN ~~LOC~~ HOSPITAL LAB): SARS Coronavirus 2: POSITIVE — AB

## 2020-03-11 LAB — LACTATE DEHYDROGENASE: LDH: 341 U/L — ABNORMAL HIGH (ref 98–192)

## 2020-03-11 LAB — CBG MONITORING, ED
Glucose-Capillary: 468 mg/dL — ABNORMAL HIGH (ref 70–99)
Glucose-Capillary: 417 mg/dL — ABNORMAL HIGH (ref 70–99)

## 2020-03-11 LAB — LACTIC ACID, PLASMA
Lactic Acid, Venous: 2.5 mmol/L (ref 0.5–1.9)
Lactic Acid, Venous: 2 mmol/L (ref 0.5–1.9)

## 2020-03-11 LAB — TRIGLYCERIDES: Triglycerides: 183 mg/dL — ABNORMAL HIGH (ref <150)

## 2020-03-11 LAB — D-DIMER, QUANTITATIVE: D-Dimer, Quant: 1.99 ug/mL-FEU — ABNORMAL HIGH (ref 0.00–0.50)

## 2020-03-11 LAB — POC SARS CORONAVIRUS 2 AG -  ED: SARS Coronavirus 2 Ag: NEGATIVE

## 2020-03-11 LAB — C-REACTIVE PROTEIN: CRP: 8.2 mg/dL — ABNORMAL HIGH (ref <1.0)

## 2020-03-11 LAB — FIBRINOGEN: Fibrinogen: >800 mg/dL — ABNORMAL HIGH (ref 210–475)

## 2020-03-11 MED ORDER — ALBUTEROL SULFATE HFA 108 (90 BASE) MCG/ACT IN AERS
2.0000 | INHALATION_SPRAY | Freq: Once | RESPIRATORY_TRACT | Status: AC
  Administered 2020-03-11: 2 via RESPIRATORY_TRACT
  Filled 2020-03-11: qty 6.7

## 2020-03-11 MED ORDER — ACETAMINOPHEN 325 MG PO TABS
650.0000 mg | ORAL_TABLET | Freq: Four times a day (QID) | ORAL | Status: DC | PRN
  Administered 2020-03-12 – 2020-03-13 (×2): 650 mg via ORAL
  Filled 2020-03-11 (×2): qty 2

## 2020-03-11 MED ORDER — INSULIN ASPART 100 UNIT/ML ~~LOC~~ SOLN: 0.0000 [IU] | Freq: Three times a day (TID) | SUBCUTANEOUS | Status: DC

## 2020-03-11 MED ORDER — INSULIN ASPART 100 UNIT/ML ~~LOC~~ SOLN
0.0000 [IU] | SUBCUTANEOUS | Status: DC
  Administered 2020-03-12 (×3): 15 [IU] via SUBCUTANEOUS
  Administered 2020-03-13 (×2): 11 [IU] via SUBCUTANEOUS
  Filled 2020-03-11 (×2): qty 1

## 2020-03-11 MED ORDER — PREDNISONE 20 MG PO TABS
50.0000 mg | ORAL_TABLET | Freq: Every day | ORAL | Status: DC
  Filled 2020-03-11: qty 1

## 2020-03-11 MED ORDER — METHYLPREDNISOLONE SODIUM SUCC 125 MG IJ SOLR
50.0000 mg | Freq: Two times a day (BID) | INTRAMUSCULAR | Status: AC
  Administered 2020-03-11 – 2020-03-13 (×5): 50 mg via INTRAVENOUS
  Filled 2020-03-11 (×6): qty 2

## 2020-03-11 MED ORDER — IOHEXOL 350 MG/ML SOLN: 100.0000 mL | Freq: Once | INTRAVENOUS | Status: DC | PRN

## 2020-03-11 MED ORDER — FLUTICASONE FUROATE 100 MCG/ACT IN AEPB: 1.0000 | INHALATION_SPRAY | Freq: Every day | RESPIRATORY_TRACT | Status: DC

## 2020-03-11 MED ORDER — SODIUM CHLORIDE 0.9 % IV SOLN
2.0000 g | INTRAVENOUS | Status: DC
  Administered 2020-03-11 – 2020-03-14 (×4): 2 g via INTRAVENOUS
  Filled 2020-03-11 (×4): qty 20

## 2020-03-11 MED ORDER — POLYETHYLENE GLYCOL 3350 17 G PO PACK: 17.0000 g | PACK | Freq: Every day | ORAL | Status: DC | PRN

## 2020-03-11 MED ORDER — SODIUM CHLORIDE 0.9 % IV BOLUS
1000.0000 mL | Freq: Once | INTRAVENOUS | Status: AC
  Administered 2020-03-11: 1000 mL via INTRAVENOUS

## 2020-03-11 MED ORDER — APIXABAN 5 MG PO TABS
5.0000 mg | ORAL_TABLET | Freq: Two times a day (BID) | ORAL | Status: DC
  Administered 2020-03-11 – 2020-03-17 (×12): 5 mg via ORAL
  Filled 2020-03-11 (×12): qty 1

## 2020-03-11 MED ORDER — INSULIN ASPART 100 UNIT/ML ~~LOC~~ SOLN: 0.0000 [IU] | Freq: Every day | SUBCUTANEOUS | Status: DC

## 2020-03-11 MED ORDER — SODIUM CHLORIDE 0.9 % IV SOLN
100.0000 mg | Freq: Every day | INTRAVENOUS | Status: AC
  Administered 2020-03-12 – 2020-03-15 (×4): 100 mg via INTRAVENOUS
  Filled 2020-03-11 (×4): qty 20

## 2020-03-11 MED ORDER — INSULIN ASPART 100 UNIT/ML ~~LOC~~ SOLN
15.0000 [IU] | Freq: Once | SUBCUTANEOUS | Status: AC
  Administered 2020-03-11: 15 [IU] via SUBCUTANEOUS
  Filled 2020-03-11: qty 1

## 2020-03-11 MED ORDER — SODIUM CHLORIDE 0.9 % IV SOLN
100.0000 mg | INTRAVENOUS | Status: AC
  Administered 2020-03-11 (×2): 100 mg via INTRAVENOUS
  Filled 2020-03-11 (×2): qty 20

## 2020-03-11 MED ORDER — NYSTATIN 100000 UNIT/GM EX POWD
Freq: Two times a day (BID) | CUTANEOUS | Status: DC
  Filled 2020-03-11 (×2): qty 15

## 2020-03-11 MED ORDER — ALBUTEROL SULFATE HFA 108 (90 BASE) MCG/ACT IN AERS: 2.0000 | INHALATION_SPRAY | Freq: Four times a day (QID) | RESPIRATORY_TRACT | Status: DC | PRN

## 2020-03-11 MED ORDER — DEXAMETHASONE SODIUM PHOSPHATE 10 MG/ML IJ SOLN
10.0000 mg | Freq: Once | INTRAMUSCULAR | Status: AC
  Administered 2020-03-11: 10 mg via INTRAVENOUS
  Filled 2020-03-11: qty 1

## 2020-03-11 MED ORDER — LEVOTHYROXINE SODIUM 88 MCG PO TABS
88.0000 ug | ORAL_TABLET | Freq: Every day | ORAL | Status: DC
  Administered 2020-03-12 – 2020-03-17 (×6): 88 ug via ORAL
  Filled 2020-03-11 (×6): qty 1

## 2020-03-11 NOTE — ED Notes (Signed)
Date and time results received: 03/11/20 4:00 PM  (use smartphrase ".now" to insert current time)  Test: Lactic Critical Value: 2.5  Name of Provider Notified: Dr. Laverta Baltimore  Orders Received? Or Actions Taken?: N/a

## 2020-03-11 NOTE — ED Triage Notes (Signed)
Multiple complaints, fever, vomiting onset 1 week ago,

## 2020-03-11 NOTE — ED Provider Notes (Addendum)
[facility] EMERGENCY DEPARTMENT Provider Note   CSN: [id number] Arrival date & time: 03/11/20  1249     History Chief Complaint  Patient presents with  . Emesis  . hypoxia    Stephanie [name] is a 70 y.o. female.  HPI 70 year old female with a history of asthma, COPD, DVT on Eliquis, DM type II, fibromyalgia, GERD, hypertension, hypothyroidism, MGUS presents to the ER with complaints of nasal congestion, weakness, poor appetite ongoing for the last week, with onset of shortness of breath approximately 2 days ago.  Patient is a poor historian.  She arrived hypoxic with a SPO2 of 79% on room air.  She denies any supplemental O2 at home.  She was placed by triage on 6 L nasal cannula, sats improved to 90%.  She is vaccinated for Covid but not boosted.  No known fevers or chills.  No abdominal pain, nausea or vomiting.  She denies any chest pain but does endorse shortness of breath.  No noticeable leg swelling.  Has been compliant with her Eliquis.    Past Medical History:  Diagnosis Date  . Anemia   . Arthritis   . Asthma   . COPD (chronic obstructive pulmonary disease) ([place])   . Deep vein thrombosis (DVT) of both lower extremities ([place]) 06/27/2015  . Diabetes mellitus   . Fibromyalgia   . GERD (gastroesophageal reflux disease)   . H/O hiatal hernia   . Hypercholesteremia   . Hypertension   . Hyperthyroidism   . IBS (irritable bowel syndrome)   . Inner ear disease   . 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 PONV (postoperative nausea and vomiting)   . Tachycardia     Patient Active Problem List   Diagnosis Date Noted  . Pneumonia due to COVID-19 virus 03/11/2020  . Abnormal LFTs 02/28/2018  . TIA (transient ischemic attack) 09/15/2017  . Localized osteoarthritis of right shoulder 08/08/2017  . Dysphagia 12/16/2015  . 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 Constipation 10/27/2015  . Diarrhea 08/02/2015  . Elevated  alkaline phosphatase level 07/23/2015  . Heme positive stool 07/23/2015  . Hypoglycemia 07/01/2015  . Deep vein thrombosis (DVT) of both lower extremities ([place]) 06/27/2015  . 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 Maculopapular rash, generalized 06/27/2015  . Alopecia 06/27/2015  . Hypothyroidism 06/27/2015  . Diverticulitis 10/19/2013  . Tobacco abuse 10/19/2013  . Obesity 10/19/2013  . Diabetes ([place]) 10/19/2013  . Hypertension   . Anemia   . GERD (gastroesophageal reflux disease)   . COPD (chronic obstructive pulmonary disease) ([place])     Past Surgical History:  Procedure Laterality Date  . ABDOMINAL HYSTERECTOMY  partial  . CARPAL TUNNEL RELEASE Right 1991  . CATARACT EXTRACTION W/PHACO Right 05/08/2013   Procedure: CATARACT EXTRACTION PHACO AND INTRAOCULAR LENS PLACEMENT (IOC);  Surgeon: Tonny Branch, MD;  Location: [facility];  Service: Ophthalmology;  Laterality: Right;  CDE 10.31  . CATARACT EXTRACTION W/PHACO Left 08/17/2013   Procedure: CATARACT EXTRACTION PHACO AND INTRAOCULAR LENS PLACEMENT (IOC);  Surgeon: Tonny Branch, MD;  Location: [facility];  Service: Ophthalmology;  Laterality: Left;  CDE:9.03  . CHOLECYSTECTOMY  1971  . COLONOSCOPY WITH PROPOFOL N/A 01/06/2016   Dr. Gala Romney: diverticulosis   . DENTAL SURGERY    . ESOPHAGEAL BRUSHING  08/29/2019   Procedure: ESOPHAGEAL BRUSHING;  Surgeon: Daneil Dolin, MD;  Location: [facility] SU[facility];  Service: Endoscopy;;  . ESOPHAGOGASTRODUODENOSCOPY (EGD) WITH PROPOFOL N/A 01/06/2016   Dr. Gala Romney: normal s/p  empiric dilation   . ESOPHAGOGASTRODUODENOSCOPY (EGD) WITH PROPOFOL N/A 08/29/2019   esophageal plaques vs medication residue adherent to tubular esophagus s/p KOH brushing and dilation. Medium-sized hiatal hernia. + for candida. Diflucan.   Marland Kitchen EYE SURGERY    . MALONEY DILATION N/A 01/06/2016   Procedure: Venia Minks DILATION;  Surgeon: Daneil Dolin, MD;  Location: [facility];  Service: Endoscopy;  Laterality: N/A;  . Venia Minks DILATION  N/A 08/29/2019   Procedure: Venia Minks DILATION;  Surgeon: Daneil Dolin, MD;  Location: [facility];  Service: Endoscopy;  Laterality: N/A;  . REVERSE SHOULDER ARTHROPLASTY Right 08/06/2017   Procedure: RIGHT REVERSE SHOULDER ARTHROPLASTY;  Surgeon: Netta Cedars, MD;  Location: [facility];  Service: Orthopedics;  Laterality: Right;  . WRIST GANGLION EXCISION Left      OB History   No obstetric history on file.     Family History  Problem Relation Age of Onset  . Colon cancer Neg Hx     Social History   Tobacco Use  . Smoking status: Former Smoker    Packs/day: 0.25    Years: 30.00    Pack years: 7.50    Types: Cigarettes    Quit date: 02/17/2014    Years since quitting: 6.0  . Smokeless tobacco: Never Used  . Tobacco comment: some day smoker  Substance Use Topics  . Alcohol use: Yes    Alcohol/week: 0.0 standard drinks    Comment: occas  . Drug use: No    Home Medications Prior to Admission medications   Medication Sig Start Date End Date Taking? Authorizing Provider  ACCU-CHEK AVIVA PLUS test strip USE TO CHECK BLOOD SUGAR TID 01/14/18   [provider]  acetaminophen (TYLENOL) 500 MG tablet Take 1,000 mg by mouth every 6 (six) hours as needed for moderate pain or headache.     [provider]  albuterol (PROVENTIL HFA;VENTOLIN HFA) 108 (90 BASE) MCG/ACT inhaler Inhale 2 puffs into the lungs every 6 (six) hours as needed for wheezing or shortness of breath.     [provider]  albuterol (PROVENTIL) (2.5 MG/3ML) 0.083% nebulizer solution Inhale 3 mLs into the lungs every 6 (six) hours as needed for shortness of breath.  03/15/19   [provider]  apixaban (ELIQUIS) 5 MG TABS tablet Take 10 mg by mouth BID through Friday 5/19 evening. Take 54m by mouth BID starting 5/20 AM. Patient taking differently: Take 5 mg by mouth 2 (two) times daily.  07/25/15   LLendon Colonel NP  ARNUITY ELLIPTA 100 MCG/ACT AEPB Inhale 1 puff into the lungs at  bedtime.  06/13/17   [provider]  Ascorbic Acid (VITAMIN C) 1000 MG tablet Take 1,000 mg by mouth daily.     [provider]  cetirizine (ZYRTEC) 10 MG tablet Take 10 mg by mouth daily.    [provider]  Cholecalciferol (VITAMIN D) 2000 units tablet Take 4,000 Units by mouth daily.     [provider]  cimetidine (TAGAMET) 200 MG tablet Take 0.5 tablets (100 mg total) by mouth at bedtime. 02/28/18   LMahala Menghini PA-C  CINNAMON PO Take 2-3 capsules by mouth 2 (two) times daily as needed (high blood sugar).     [provider]  clobetasol ointment (TEMOVATE) 04.27% Apply 1 application topically 3 (three) times daily as needed (imflammation).  06/20/15   [provider]  Cod Liver Oil CAPS Take 1 capsule by mouth at bedtime.  [provider]  diclofenac sodium (VOLTAREN) 1 % GEL Apply 2 g topically 2 (two) times daily. 09/16/17   Kayleen Memos, DO  diphenhydrAMINE (BENADRYL) 25 MG tablet Take 25 mg by mouth every 6 (six) hours as needed for itching.    [provider]  Doxepin HCl 5 % CREA Apply 2 g topically 3 (three) times daily as needed (itching).  05/28/16   [provider]  DULoxetine (CYMBALTA) 30 MG capsule Take 1 capsule (30 mg total) by mouth 2 (two) times daily. 09/16/17   Kayleen Memos, DO  fluticasone (VERAMYST) 27.5 MCG/SPRAY nasal spray Place 2 sprays into the nose daily.     [provider]  HUMALOG KWIKPEN 100 UNIT/ML KiwkPen Inject 30-47 Units into the skin See admin instructions. If blood sugar is  90-150= 45 units, 150-200= 46 units. 200-250= 47 units at breakfast and supper Lunchtime: If BS is  90-150=30 units,150- 200=31 units,200-250 units 32 09/30/17   [provider]  hydrOXYzine (ATARAX/VISTARIL) 25 MG tablet Take 25 mg by mouth every 8 (eight) hours as needed for itching.  06/20/15   [provider]  levothyroxine (SYNTHROID) 88 MCG tablet Take 88 mcg by mouth daily  before breakfast.    [provider]  lubiprostone (AMITIZA) 8 MCG capsule Take 1 capsule (8 mcg total) by mouth 2 (two) times daily as needed for constipation. 01/02/20   Annitta Needs, NP  LYRICA 75 MG capsule Take 75 mg by mouth 2 (two) times daily.  07/18/15   [provider]  methocarbamol (ROBAXIN) 500 MG tablet Take 1 tablet (500 mg total) by mouth 3 (three) times daily as needed. Patient taking differently: Take 500 mg by mouth 3 (three) times daily as needed for muscle spasms.  08/06/17   Netta Cedars, MD  metolazone (ZAROXOLYN) 5 MG tablet Take 1 tablet (5 mg total) by mouth 2 (two) times daily. Patient taking differently: Take 10 mg by mouth at bedtime.  07/02/15   Samuella Cota, MD  metoprolol tartrate (LOPRESSOR) 25 MG tablet Take 1 tablet (25 mg total) by mouth 2 (two) times daily. Patient taking differently: Take 25-37.5 mg by mouth See admin instructions. Take 37.5 mg in the morning and 25 mg at night 09/16/17   Kayleen Memos, DO  omega-3 acid ethyl esters (LOVAZA) 1 g capsule Take 1 g by mouth in the morning, at noon, in the evening, and at bedtime.    [provider]  pantoprazole (PROTONIX) 40 MG tablet Take 1 tablet (40 mg total) by mouth 2 (two) times daily before a meal. 01/02/20 04/01/20  Annitta Needs, NP  potassium chloride (KLOR-CON) 10 MEQ tablet Take 10 mEq by mouth daily.    [provider]  pravastatin (PRAVACHOL) 80 MG tablet Take 1 tablet (80 mg total) by mouth at bedtime. 09/16/17   Kayleen Memos, DO  torsemide (DEMADEX) 20 MG tablet Take 2 tablets (40 mg total) by mouth daily. Patient taking differently: Take 10-20 mg by mouth 2 (two) times daily as needed (swelling).  07/02/15   Samuella Cota, MD  traMADol (ULTRAM) 50 MG tablet Take 50 mg by mouth 3 (three) times daily as needed for moderate pain.  01/07/18   [provider]  traZODone (DESYREL) 50 MG tablet Take 50 mg by mouth at bedtime.    [provider]   vitamin B-12 (CYANOCOBALAMIN) 1000 MCG tablet Take 1,000 mcg by mouth daily.     [provider]  vitamin E 200 UNIT capsule Take 200 Units by mouth daily.     [provider]  XULTOPHY 100-3.6 UNIT-MG/ML SOPN Inject 75 Units as directed at bedtime.  12/28/17   [provider]    Allergies    Tetracyclines & related, Banana, and Penicillins  Review of Systems   Review of Systems  Constitutional: Positive for activity change, appetite change, chills and fatigue. Negative for fever.  HENT: Positive for congestion.   Respiratory: Positive for cough and shortness of breath.   Cardiovascular: Negative for chest pain.  Gastrointestinal: Negative for abdominal pain, diarrhea, nausea and vomiting.  Genitourinary: Negative for dysuria.  Neurological: Positive for weakness.    Physical Exam Updated Vital Signs BP (!) 141/79   Pulse (!) 114   Temp (!) 97 F (36.1 C)   Resp 18   SpO2 94%   Physical Exam Vitals and nursing note reviewed.  Constitutional:      General: She is not in acute distress.    Appearance: She is well-developed and well-nourished. She is ill-appearing. She is not diaphoretic.  HENT:     Head: Normocephalic and atraumatic.  Eyes:     Conjunctiva/sclera: Conjunctivae normal.  Cardiovascular:     Rate and Rhythm: Regular rhythm. Tachycardia present.     Pulses: Normal pulses.     Heart sounds: No murmur heard.   Pulmonary:     Effort: No respiratory distress.     Breath sounds: Wheezing present. No rales.     Comments: Tachypneic, speaking in short sentences, with mild audible wheezes Abdominal:     Palpations: Abdomen is soft.     Tenderness: There is no abdominal tenderness.  Musculoskeletal:        General: No tenderness, deformity or edema. Normal range of motion.     Cervical back: Normal range of motion and neck supple.  Skin:    General: Skin is warm and dry.     Capillary Refill: Capillary refill takes less than 2  seconds.  Neurological:     General: No focal deficit present.     Mental Status: She is alert and oriented to person, place, and time.     Sensory: No sensory deficit.     Motor: No weakness.  Psychiatric:        Mood and Affect: Mood and affect and mood normal.        Behavior: Behavior normal.     ED Results / Procedures / Treatments   Labs (all labs ordered are listed, but only abnormal results are displayed) Labs Reviewed  SARS CORONAVIRUS 2 BY RT PCR (HOSPITAL ORDER, [place] [facility]) - Abnormal; Notable for the following components:      Result Value   SARS Coronavirus 2 POSITIVE (*)    All other components within normal limits  CBC WITH DIFFERENTIAL/PLATELET - Abnormal; Notable for the following components:   WBC 12.1 (*)    RBC 5.35 (*)    MCV 78.3 (*)    MCH 24.7 (*)    RDW 16.9 (*)    Neutro Abs 10.2 (*)    Abs Immature Granulocytes 0.14 (*)    All other components within normal limits  COMPREHENSIVE METABOLIC PANEL - Abnormal; Notable for the following components:   Sodium 128 (*)    Chloride 93 (*)    CO2 21 (*)    Glucose, Bld 478 (*)    BUN 53 (*)    Creatinine, Ser 1.77 (*)  Calcium 8.3 (*)    Total Protein 8.9 (*)    Albumin 2.5 (*)    Alkaline Phosphatase 279 (*)    GFR, Estimated 31 (*)    All other components within normal limits  LACTIC ACID, PLASMA - Abnormal; Notable for the following components:   Lactic Acid, Venous 2.5 (*)    All other components within normal limits  D-DIMER, QUANTITATIVE (NOT AT [facility]) - Abnormal; Notable for the following components:   D-Dimer, Quant 1.99 (*)    All other components within normal limits  LACTATE DEHYDROGENASE - Abnormal; Notable for the following components:   LDH 341 (*)    All other components within normal limits  TRIGLYCERIDES - Abnormal; Notable for the following components:   Triglycerides 183 (*)    All other components within normal limits  FIBRINOGEN - Abnormal; Notable  for the following components:   Fibrinogen >800 (*)    All other components within normal limits  C-REACTIVE PROTEIN - Abnormal; Notable for the following components:   CRP 8.2 (*)    All other components within normal limits  CULTURE, BLOOD (ROUTINE X 2)  CULTURE, BLOOD (ROUTINE X 2)  PROCALCITONIN  FERRITIN  LACTIC ACID, PLASMA  BRAIN NATRIURETIC PEPTIDE  POC SARS CORONAVIRUS 2 AG -  ED    EKG EKG Interpretation  Date/Time:  Monday March 11 2020 13:48:45 EST Ventricular Rate:  119 PR Interval:    QRS Duration: 119 QT Interval:  363 QTC Calculation: 511 R Axis:   0 Text Interpretation: Sinus tachycardia Probable LVH with secondary repol abnrm Inferior infarct, age indeterminate Prolonged QT interval Confirmed by Fredia Sorrow [phone]) on 03/11/2020 2:02:02 PM   Radiology DG Chest Portable 1 View  Result Date: 03/11/2020 CLINICAL DATA:  Cough EXAM: PORTABLE CHEST 1 VIEW COMPARISON:  09/15/2017 FINDINGS: The heart size and mediastinal contours are within normal limits. Subtle bilateral heterogeneous and interstitial airspace opacity. The visualized skeletal structures are unremarkable. IMPRESSION: Subtle bilateral heterogeneous and interstitial airspace opacity, concerning for infection, including COVID-19 if clinically suspected. Electronically Signed   By: Eddie Candle M.D.   On: 03/11/2020 15:01    Procedures .Critical Care Performed by: Garald Balding, PA-C Authorized by: Garald Balding, PA-C   Critical care provider statement:    Critical care time (minutes):  45   Critical care was necessary to treat or prevent imminent or life-threatening deterioration of the following conditions:  Respiratory failure and dehydration   Critical care was time spent personally by me on the following activities:  Discussions with consultants, evaluation of patient's response to treatment, examination of patient, ordering and performing treatments and interventions, ordering and  review of laboratory studies, ordering and review of radiographic studies, pulse oximetry, re-evaluation of patient's condition, obtaining history from patient or surrogate and review of old charts     Medications Ordered in ED Medications  remdesivir 100 mg in sodium chloride 0.9 % 100 mL IVPB (0 mg Intravenous Stopped 03/11/20 1649)  remdesivir 100 mg in sodium chloride 0.9 % 100 mL IVPB (has no administration in time range)  iohexol (OMNIPAQUE) 350 MG/ML injection 100 mL (has no administration in time range)  albuterol (VENTOLIN HFA) 108 (90 Base) MCG/ACT inhaler 2 puff (2 puffs Inhalation Given 03/11/20 1549)  dexamethasone (DECADRON) injection 10 mg (10 mg Intravenous Given 03/11/20 1602)    ED Course  I have reviewed the triage vital signs and the nursing notes.  Pertinent labs & imaging results that were available during my  care of the patient were reviewed by me and considered in my medical decision making (see chart for details).    MDM Rules/Calculators/A&P                          70 year old female who presents to the ER with shortness of breath, weakness, nasal congestion and reported dehydration.  On arrival, she is tachypneic, speaking in short sentences, originally hypoxic on room air at 79%, placed on 6 L nasal cannula, improved to 90%.  She is tachycardic with a rate at 119 per EKG.  No evidence of ischemia noted.  Physical exam with decreased breath sounds throughout, some audible wheezes noted.  Afebrile, blood pressure 172/79.  Currently meets SIRS criteria with tachycardia and tachypnea, will initiate sepsis work-up however code sepsis not called  Lab work ordered, reviewed and interpreted by me -CBC with a leukocytosis of 12.1, stable hemoglobin. -CMP with mild hyponatremia 128, creatinine and BUN elevated from her baseline, at 1.77 and 53 respectively today.  AST and ALT normal. -Lactic acid of 2.5 -Covid PCR is positive here, rapid was negative in triage.  COVID-19  suspected per admission order set labs were initiated  Imaging ordered, reviewed and interpreted by me -Chest x-ray concerning for pneumonia in the setting of COVID-19  Given the patient's history of DVT and in the setting of COVID-19, PE study was ordered.  EKG ordered, reviewed and interpreted by myself my supervising physician -Consistent with sinus tach, no evidence of ischemia  Patient will require admission for acute respiratory failure in the setting of COVID-19.  Initiated Decadron and remdesivir.  Consulted Dr. Denton Brick with the hospitalist team who will admit the patient further evaluation and treatment.  This was a shared visit with my supervising physician Dr. Rogene Houston who independently saw and evaluated the patient & provided guidance in evaluation/management/disposition ,in agreement with care   Final Clinical Impression(s) / ED Diagnoses Final diagnoses:  Acute respiratory failure due to COVID-19 [facility])    Rx / [place] Orders ED Discharge Orders    None          Lyndel Safe 03/11/20 1947    Fredia Sorrow, MD 03/28/20 [phone]

## 2020-03-11 NOTE — ED Notes (Signed)
Dr Arlyce Dice canceled CT scan.

## 2020-03-11 NOTE — ED Notes (Signed)
Perineal and buttock raw, red and irritated.  PA in to see.  Areas cleaned, dried and protective cream applied.  Pt had tissue area perineal area, tissue with tan foul smelling drainage noted.

## 2020-03-11 NOTE — H&P (Addendum)
History and Physical    Stephanie [name] GLO:[id number] DOB: 1950/12/03 DOA: 03/11/2020  PCP: [name], FNP   Patient coming from: Home  I have personally briefly reviewed patient's old medical records in [place]  Chief Complaint: Difficulty breathing  HPI: Stephanie [name] is a 70 y.o. female with medical history significant for COPD, hypertension, hypothyroidism, diabetes mellitus, DVT. Presented to the ED with complaints of fevers, nasal congestion, vomiting, cough of about 6 days duration.  Difficulty breathing started 2 days ago.  She is vaccinated for [employer], but she did not get the booster dose.  She has some mild dull central chest pain from coughing.  No lower extremity swelling. Patient is on Eliquis, but she reports she has not taking Eliquis in about 2 weeks because she was not feeling well.  ED Course: Impression 97.  Heart rate tachycardic to 123, respiratory rate 18-28.  Blood pressure systolic 188C to 166A.  O2 sat 78% on room air, requiring 5 L of O2 via nasal cannula-sats now 94 to 97%. D-dimer 1.99.  Procalcitonin 0.79.  Creatinine elevated 1.77.  WBC 12.1. BNP 68.  Lactic acid 2.5.  Portable chest x-ray subtle bilateral heterogeneous and interstitial airspace opacity concerning for infection. Steroids and remdesivir initiated.  Hospitalist to admit for Covid pneumonia.  Review of Systems: As per HPI all other systems reviewed and negative.  Past Medical History:  Diagnosis Date  . Anemia   . Arthritis   . Asthma   . COPD (chronic obstructive pulmonary disease) ([place])   . Deep vein thrombosis (DVT) of both lower extremities ([place]) 06/27/2015  . Diabetes mellitus   . Fibromyalgia   . GERD (gastroesophageal reflux disease)   . H/O hiatal hernia   . Hypercholesteremia   . Hypertension   . Hyperthyroidism   . IBS (irritable bowel syndrome)   . Inner ear disease   . 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 PONV  (postoperative nausea and vomiting)   . Tachycardia     Past Surgical History:  Procedure Laterality Date  . ABDOMINAL HYSTERECTOMY  partial  . CARPAL TUNNEL RELEASE Right 1991  . CATARACT EXTRACTION W/PHACO Right 05/08/2013   Procedure: CATARACT EXTRACTION PHACO AND INTRAOCULAR LENS PLACEMENT (IOC);  Surgeon: Tonny Branch, MD;  Location: [facility];  Service: Ophthalmology;  Laterality: Right;  CDE 10.31  . CATARACT EXTRACTION W/PHACO Left 08/17/2013   Procedure: CATARACT EXTRACTION PHACO AND INTRAOCULAR LENS PLACEMENT (IOC);  Surgeon: Tonny Branch, MD;  Location: [facility];  Service: Ophthalmology;  Laterality: Left;  CDE:9.03  . CHOLECYSTECTOMY  1971  . COLONOSCOPY WITH PROPOFOL N/A 01/06/2016   Dr. Gala Romney: diverticulosis   . DENTAL SURGERY    . ESOPHAGEAL BRUSHING  08/29/2019   Procedure: ESOPHAGEAL BRUSHING;  Surgeon: Daneil Dolin, MD;  Location: [facility];  Service: Endoscopy;;  . ESOPHAGOGASTRODUODENOSCOPY (EGD) WITH PROPOFOL N/A 01/06/2016   Dr. Gala Romney: normal s/p empiric dilation   . ESOPHAGOGASTRODUODENOSCOPY (EGD) WITH PROPOFOL N/A 08/29/2019   esophageal plaques vs medication residue adherent to tubular esophagus s/p KOH brushing and dilation. Medium-sized hiatal hernia. + for candida. Diflucan.   Marland Kitchen EYE SURGERY    . MALONEY DILATION N/A 01/06/2016   Procedure: Venia Minks DILATION;  Surgeon: Daneil Dolin, MD;  Location: [facility];  Service: Endoscopy;  Laterality: N/A;  . Venia Minks DILATION N/A 08/29/2019   Procedure: Venia Minks DILATION;  Surgeon: Daneil Dolin, MD;  Location: [facility];  Service: Endoscopy;  Laterality: N/A;  . REVERSE SHOULDER ARTHROPLASTY Right 08/06/2017   Procedure: RIGHT REVERSE SHOULDER ARTHROPLASTY;  Surgeon: Netta Cedars, MD;  Location: [facility];  Service: Orthopedics;  Laterality: Right;  . WRIST GANGLION EXCISION Left      reports that she quit smoking about 6 years ago. Her smoking use included cigarettes. She has a 7.50 pack-year smoking history. She has  never used smokeless tobacco. She reports current alcohol use. She reports that she does not use drugs.  Allergies  Allergen Reactions  . Tetracyclines & Related Anaphylaxis  . Banana Hives and Nausea And Vomiting  . Penicillins Rash and Other (See Comments)    Has patient had a PCN reaction causing immediate rash, facial/tongue/throat swelling, SOB or lightheadedness with hypotension: No Has patient had a PCN reaction causing severe rash involving mucus membranes or skin necrosis: No Has patient had a PCN reaction that required hospitalization No Has patient had a PCN reaction occurring within the last 10 years: No If all of the above answers are "NO", then may proceed with Cephalosporin use.     Family History  Problem Relation Age of Onset  . Colon cancer Neg Hx     Prior to Admission medications   Medication Sig Start Date End Date Taking? Authorizing Provider  ACCU-CHEK AVIVA PLUS test strip USE TO CHECK BLOOD SUGAR TID 01/14/18   [provider]  acetaminophen (TYLENOL) 500 MG tablet Take 1,000 mg by mouth every 6 (six) hours as needed for moderate pain or headache.     [provider]  albuterol (PROVENTIL HFA;VENTOLIN HFA) 108 (90 BASE) MCG/ACT inhaler Inhale 2 puffs into the lungs every 6 (six) hours as needed for wheezing or shortness of breath.     [provider]  albuterol (PROVENTIL) (2.5 MG/3ML) 0.083% nebulizer solution Inhale 3 mLs into the lungs every 6 (six) hours as needed for shortness of breath.  03/15/19   [provider]  apixaban (ELIQUIS) 5 MG TABS tablet Take 10 mg by mouth BID through Friday 5/19 evening. Take 69m by mouth BID starting 5/20 AM. Patient taking differently: Take 5 mg by mouth 2 (two) times daily.  07/25/15   LLendon Colonel NP  ARNUITY ELLIPTA 100 MCG/ACT AEPB Inhale 1 puff into the lungs at bedtime.  06/13/17   [provider]  Ascorbic Acid (VITAMIN C) 1000 MG tablet Take 1,000 mg by mouth daily.      [provider]  cetirizine (ZYRTEC) 10 MG tablet Take 10 mg by mouth daily.    [provider]  Cholecalciferol (VITAMIN D) 2000 units tablet Take 4,000 Units by mouth daily.     [provider]  cimetidine (TAGAMET) 200 MG tablet Take 0.5 tablets (100 mg total) by mouth at bedtime. 02/28/18   LMahala Menghini PA-C  CINNAMON PO Take 2-3 capsules by mouth 2 (two) times daily as needed (high blood sugar).     [provider]  clobetasol ointment (TEMOVATE) 09.38% Apply 1 application topically 3 (three) times daily as needed (imflammation).  06/20/15   [provider]  Cod Liver Oil CAPS Take 1 capsule by mouth at bedtime.     [provider]  diclofenac sodium (VOLTAREN) 1 % GEL Apply 2 g topically 2 (two) times daily. 09/16/17   HKayleen Memos DO  diphenhydrAMINE (BENADRYL) 25 MG tablet Take 25 mg by mouth every 6 (six) hours as needed for itching.    [provider]  Doxepin HCl 5 %  CREA Apply 2 g topically 3 (three) times daily as needed (itching).  05/28/16   [provider]  DULoxetine (CYMBALTA) 30 MG capsule Take 1 capsule (30 mg total) by mouth 2 (two) times daily. 09/16/17   Kayleen Memos, DO  fluticasone (VERAMYST) 27.5 MCG/SPRAY nasal spray Place 2 sprays into the nose daily.     [provider]  HUMALOG KWIKPEN 100 UNIT/ML KiwkPen Inject 30-47 Units into the skin See admin instructions. If blood sugar is  90-150= 45 units, 150-200= 46 units. 200-250= 47 units at breakfast and supper Lunchtime: If BS is  90-150=30 units,150- 200=31 units,200-250 units 32 09/30/17   [provider]  hydrOXYzine (ATARAX/VISTARIL) 25 MG tablet Take 25 mg by mouth every 8 (eight) hours as needed for itching.  06/20/15   [provider]  levothyroxine (SYNTHROID) 88 MCG tablet Take 88 mcg by mouth daily before breakfast.    [provider]  lubiprostone (AMITIZA) 8 MCG capsule Take 1 capsule (8 mcg total) by  mouth 2 (two) times daily as needed for constipation. 01/02/20   Annitta Needs, NP  LYRICA 75 MG capsule Take 75 mg by mouth 2 (two) times daily.  07/18/15   [provider]  methocarbamol (ROBAXIN) 500 MG tablet Take 1 tablet (500 mg total) by mouth 3 (three) times daily as needed. Patient taking differently: Take 500 mg by mouth 3 (three) times daily as needed for muscle spasms.  08/06/17   Netta Cedars, MD  metolazone (ZAROXOLYN) 5 MG tablet Take 1 tablet (5 mg total) by mouth 2 (two) times daily. Patient taking differently: Take 10 mg by mouth at bedtime.  07/02/15   Samuella Cota, MD  metoprolol tartrate (LOPRESSOR) 25 MG tablet Take 1 tablet (25 mg total) by mouth 2 (two) times daily. Patient taking differently: Take 25-37.5 mg by mouth See admin instructions. Take 37.5 mg in the morning and 25 mg at night 09/16/17   Kayleen Memos, DO  omega-3 acid ethyl esters (LOVAZA) 1 g capsule Take 1 g by mouth in the morning, at noon, in the evening, and at bedtime.    [provider]  pantoprazole (PROTONIX) 40 MG tablet Take 1 tablet (40 mg total) by mouth 2 (two) times daily before a meal. 01/02/20 04/01/20  Annitta Needs, NP  potassium chloride (KLOR-CON) 10 MEQ tablet Take 10 mEq by mouth daily.    [provider]  pravastatin (PRAVACHOL) 80 MG tablet Take 1 tablet (80 mg total) by mouth at bedtime. 09/16/17   Kayleen Memos, DO  torsemide (DEMADEX) 20 MG tablet Take 2 tablets (40 mg total) by mouth daily. Patient taking differently: Take 10-20 mg by mouth 2 (two) times daily as needed (swelling).  07/02/15   Samuella Cota, MD  traMADol (ULTRAM) 50 MG tablet Take 50 mg by mouth 3 (three) times daily as needed for moderate pain.  01/07/18   [provider]  traZODone (DESYREL) 50 MG tablet Take 50 mg by mouth at bedtime.    [provider]  vitamin B-12 (CYANOCOBALAMIN) 1000 MCG tablet Take 1,000 mcg by mouth daily.     [provider]  vitamin  E 200 UNIT capsule Take 200 Units by mouth daily.     [provider]  XULTOPHY 100-3.6 UNIT-MG/ML SOPN Inject 75 Units as directed at bedtime.  12/28/17   [provider]    Physical Exam: Vitals:   03/11/20 1455 03/11/20 1500 03/11/20 1549 03/11/20 1600  BP: (!) 151/80 (!) 169/151  (!) 141/79  Pulse: (!) 111 (!) 109  (!) 114  Resp: 18 (!) 21  18  Temp:      SpO2: 91% 94% 97% 94%    Constitutional: NAD, calm, comfortable Vitals:   03/11/20 1455 03/11/20 1500 03/11/20 1549 03/11/20 1600  BP: (!) 151/80 (!) 169/151  (!) 141/79  Pulse: (!) 111 (!) 109  (!) 114  Resp: 18 (!) 21  18  Temp:      SpO2: 91% 94% 97% 94%   Eyes: PERRL, lids and conjunctivae normal ENMT: Mucous membranes are moist. Neck: normal, supple, no masses, no thyromegaly Respiratory:  Normal respiratory effort. No accessory muscle use.  Cardiovascular: Regular rate and rhythm, no murmurs / rubs / gallops. No extremity edema. 2+ pedal pulses.  Abdomen: no tenderness, no masses palpated. No hepatosplenomegaly. Bowel sounds positive.  Musculoskeletal: no clubbing / cyanosis. No joint deformity upper and lower extremities. Good ROM, no contractures. Normal muscle tone.  Skin: yeast infection underneath abdominal pannus, with discharge.  No rashes, lesions, ulcers. No induration Neurologic: No apparent cranial abnormality, moving extremities spontaneously. Psychiatric: Normal judgment and insight. Alert and oriented x 3. Normal mood.   Labs on Admission: I have personally reviewed following labs and imaging studies  CBC: Recent Labs  Lab 03/11/20 1359  WBC 12.1*  NEUTROABS 10.2*  HGB 13.2  HCT 41.9  MCV 78.3*  PLT 885   Basic Metabolic Panel: Recent Labs  Lab 03/11/20 1359  NA 128*  K 4.0  CL 93*  CO2 21*  GLUCOSE 478*  BUN 53*  CREATININE 1.77*  CALCIUM 8.3*   GFR: CrCl cannot be calculated (Unknown ideal weight.). Liver Function Tests: Recent Labs  Lab 03/11/20 1359  AST  40  ALT 44  ALKPHOS 279*  BILITOT 1.0  PROT 8.9*  ALBUMIN 2.5*    Lipid Profile: Recent Labs    03/11/20 1353  TRIG 183*   Anemia Panel: Recent Labs    03/11/20 1353  FERRITIN 135   Radiological Exams on Admission: DG Chest Portable 1 View  Result Date: 03/11/2020 CLINICAL DATA:  Cough EXAM: PORTABLE CHEST 1 VIEW COMPARISON:  09/15/2017 FINDINGS: The heart size and mediastinal contours are within normal limits. Subtle bilateral heterogeneous and interstitial airspace opacity. The visualized skeletal structures are unremarkable. IMPRESSION: Subtle bilateral heterogeneous and interstitial airspace opacity, concerning for infection, including COVID-19 if clinically suspected. Electronically Signed   By: Eddie Candle M.D.   On: 03/11/2020 15:01    EKG: Independently reviewed.  Sinus tachycardia, rate- 119.  Q waves inferior lateral leads.  QTc prolonged at 511.  No significant change from prior EKG.  Assessment/Plan Principal Problem:   Pneumonia due to COVID-19 virus Active Problems:   Hypertension   COPD (chronic obstructive pulmonary disease) ([facility])   Diabetes ([facility])   Deep vein thrombosis (DVT) of both lower extremities ([facility])   Hypothyroidism   MGUS (monoclonal gammopathy of unknown significance)   Acute respiratory disease due to COVID-19 virus  Pneumonia due to COVID-19 virus with acute respiratory failure-symptom onset was 6 days ago.  Dyspneic.  O2 sats 78% on room air, currently on 5 L sats 94 - 97%.  Portable chest x-ray-subtle airspace opacities bilaterally concerning for COVID-19 infection.  D-dimer 1.19, also elevation in inflammatory markers CRP and LDH.. Lactic acidosis of 2.5, and procalcitonin elevated at 0.79.Marland Kitchen  WBC 12, appears to have chronic fluctuating leukocytosis. -Obtain and trend inflammatory markers -IV Decadron 10 mg given, continue  with Solu-Medrol 0.68m/kg in a.m. -IV remdesivir -Supplemental O2 -Covid admission protocol -Incentive spirometer  flutter valve, inhalers as needed. -Multivitamins -Trend lactic acid -1 L bolus, cont N/s 75cc/hr x 12hrs -IV ceftriaxone for now with elevated procalcitonin.  Prolonged QTC and allergies limiting options.  AKI on CKD 3B- Cr 1.77.  Baseline ~1.1-1.2. - Hydrate -Hold metolazone and torsemide for now  Prolonged QTC- 511.  Home medications include Cymbalta.  Potassium is 4. -Avoid QT further prolonging medications -Check magnesium  Candidal intertrigo- abdominal pannus area. -Topical nystatin - held off on oral fluconazole as her QTC is prolonged.  COPD-stable not on home O2 -Albuterol inhaler as needed  Diabetes mellitus-random glucose 478, anion gap 14, serum bicarb of 21. - SSI- M q4h - Hydrate -Monitor glucose on steroids  Diastolic CHF-stable and compensated.  Echo 2019, EF of 55 to 60% with grade 1 diastolic dysfunction. -Hold torsemide and metolazone for now. -Resume metoprolol pending med reconciliation  DVT -Resume Eliquis  MGUS-follows with Dr. KDelton Coombes per notes 08/2019 MGUS is stable.  Hypothyroidism -Resume Synthroid  DVT prophylaxis: Eliquis Code Status: Full code Family Communication: None at bedside Disposition Plan: ~ /> 2 days Consults called: None Admission status: Inpt, tele  I certify that at the point of admission it is my clinical judgment that the patient will require inpatient hospital care spanning beyond 2 midnights from the point of admission due to high intensity of service, high risk for further deterioration and high frequency of surveillance required.    EBethena RoysMD [facility]  03/11/2020, 6:43 PM

## 2020-03-11 NOTE — ED Triage Notes (Signed)
Placed on o2 via cannula at 6 liters per minute, oxygen up to 89%

## 2020-03-12 ENCOUNTER — Other Ambulatory Visit: Payer: Self-pay

## 2020-03-12 DIAGNOSIS — I10 Essential (primary) hypertension: Secondary | ICD-10-CM | POA: Diagnosis not present

## 2020-03-12 DIAGNOSIS — E039 Hypothyroidism, unspecified: Secondary | ICD-10-CM

## 2020-03-12 DIAGNOSIS — J449 Chronic obstructive pulmonary disease, unspecified: Secondary | ICD-10-CM | POA: Diagnosis not present

## 2020-03-12 DIAGNOSIS — J9600 Acute respiratory failure, unspecified whether with hypoxia or hypercapnia: Secondary | ICD-10-CM

## 2020-03-12 DIAGNOSIS — U071 COVID-19: Secondary | ICD-10-CM | POA: Diagnosis not present

## 2020-03-12 DIAGNOSIS — I82443 Acute embolism and thrombosis of tibial vein, bilateral: Secondary | ICD-10-CM

## 2020-03-12 DIAGNOSIS — N179 Acute kidney failure, unspecified: Secondary | ICD-10-CM

## 2020-03-12 DIAGNOSIS — N189 Chronic kidney disease, unspecified: Secondary | ICD-10-CM

## 2020-03-12 LAB — COMPREHENSIVE METABOLIC PANEL
ALT: 34 U/L (ref 0–44)
AST: 26 U/L (ref 15–41)
Albumin: 2.3 g/dL — ABNORMAL LOW (ref 3.5–5.0)
Alkaline Phosphatase: 227 U/L — ABNORMAL HIGH (ref 38–126)
Anion gap: 11 (ref 5–15)
BUN: 51 mg/dL — ABNORMAL HIGH (ref 8–23)
CO2: 24 mmol/L (ref 22–32)
Calcium: 8.4 mg/dL — ABNORMAL LOW (ref 8.9–10.3)
Chloride: 98 mmol/L (ref 98–111)
Creatinine, Ser: 1.35 mg/dL — ABNORMAL HIGH (ref 0.44–1.00)
GFR, Estimated: 43 mL/min — ABNORMAL LOW (ref >60)
Glucose, Bld: 429 mg/dL — ABNORMAL HIGH (ref 70–99)
Potassium: 4.6 mmol/L (ref 3.5–5.1)
Sodium: 133 mmol/L — ABNORMAL LOW (ref 135–145)
Total Bilirubin: 0.6 mg/dL (ref 0.3–1.2)
Total Protein: 8.3 g/dL — ABNORMAL HIGH (ref 6.5–8.1)

## 2020-03-12 LAB — HIV ANTIBODY (ROUTINE TESTING W REFLEX): HIV Screen 4th Generation wRfx: NONREACTIVE

## 2020-03-12 LAB — CBC WITH DIFFERENTIAL/PLATELET
Abs Immature Granulocytes: 0.04 10*3/uL (ref 0.00–0.07)
Basophils Absolute: 0 10*3/uL (ref 0.0–0.1)
Basophils Relative: 0 %
Eosinophils Absolute: 0 10*3/uL (ref 0.0–0.5)
Eosinophils Relative: 0 %
HCT: 41.4 % (ref 36.0–46.0)
Hemoglobin: 12.8 g/dL (ref 12.0–15.0)
Immature Granulocytes: 1 %
Lymphocytes Relative: 11 %
Lymphs Abs: 0.7 10*3/uL (ref 0.7–4.0)
MCH: 24.7 pg — ABNORMAL LOW (ref 26.0–34.0)
MCHC: 30.9 g/dL (ref 30.0–36.0)
MCV: 79.9 fL — ABNORMAL LOW (ref 80.0–100.0)
Monocytes Absolute: 0.1 10*3/uL (ref 0.1–1.0)
Monocytes Relative: 2 %
Neutro Abs: 5.5 10*3/uL (ref 1.7–7.7)
Neutrophils Relative %: 86 %
Platelets: 345 10*3/uL (ref 150–400)
RBC: 5.18 MIL/uL — ABNORMAL HIGH (ref 3.87–5.11)
RDW: 16.9 % — ABNORMAL HIGH (ref 11.5–15.5)
WBC: 6.4 10*3/uL (ref 4.0–10.5)
nRBC: 0 % (ref 0.0–0.2)

## 2020-03-12 LAB — CBG MONITORING, ED
Glucose-Capillary: 412 mg/dL — ABNORMAL HIGH (ref 70–99)
Glucose-Capillary: 505 mg/dL (ref 70–99)
Glucose-Capillary: 516 mg/dL (ref 70–99)
Glucose-Capillary: 428 mg/dL — ABNORMAL HIGH (ref 70–99)

## 2020-03-12 LAB — D-DIMER, QUANTITATIVE: D-Dimer, Quant: 2.07 ug/mL-FEU — ABNORMAL HIGH (ref 0.00–0.50)

## 2020-03-12 LAB — HEMOGLOBIN A1C
Hgb A1c MFr Bld: 9.2 % — ABNORMAL HIGH (ref 4.8–5.6)
Mean Plasma Glucose: 217.34 mg/dL

## 2020-03-12 LAB — PHOSPHORUS: Phosphorus: 3 mg/dL (ref 2.5–4.6)

## 2020-03-12 LAB — GLUCOSE, CAPILLARY
Glucose-Capillary: 400 mg/dL — ABNORMAL HIGH (ref 70–99)
Glucose-Capillary: 388 mg/dL — ABNORMAL HIGH (ref 70–99)

## 2020-03-12 LAB — FERRITIN: Ferritin: 770 ng/mL — ABNORMAL HIGH (ref 11–307)

## 2020-03-12 LAB — C-REACTIVE PROTEIN: CRP: 30.7 mg/dL — ABNORMAL HIGH (ref <1.0)

## 2020-03-12 LAB — MAGNESIUM: Magnesium: 2.5 mg/dL — ABNORMAL HIGH (ref 1.7–2.4)

## 2020-03-12 MED ORDER — SODIUM CHLORIDE 0.9 % IV SOLN
500.0000 mg | INTRAVENOUS | Status: DC
  Administered 2020-03-12 – 2020-03-13 (×2): 500 mg via INTRAVENOUS
  Filled 2020-03-12 (×3): qty 500

## 2020-03-12 MED ORDER — INSULIN DETEMIR 100 UNIT/ML ~~LOC~~ SOLN
16.0000 [IU] | Freq: Two times a day (BID) | SUBCUTANEOUS | Status: DC
  Administered 2020-03-12: 16 [IU] via SUBCUTANEOUS
  Filled 2020-03-12 (×3): qty 0.16

## 2020-03-12 MED ORDER — INSULIN ASPART 100 UNIT/ML ~~LOC~~ SOLN
10.0000 [IU] | Freq: Three times a day (TID) | SUBCUTANEOUS | Status: DC
  Administered 2020-03-12 (×2): 10 [IU] via SUBCUTANEOUS
  Filled 2020-03-12: qty 1

## 2020-03-12 MED ORDER — INSULIN ASPART 100 UNIT/ML ~~LOC~~ SOLN
20.0000 [IU] | Freq: Once | SUBCUTANEOUS | Status: AC
  Administered 2020-03-12: 20 [IU] via SUBCUTANEOUS

## 2020-03-12 MED ORDER — INSULIN DETEMIR 100 UNIT/ML ~~LOC~~ SOLN
10.0000 [IU] | Freq: Two times a day (BID) | SUBCUTANEOUS | Status: DC
  Filled 2020-03-12 (×3): qty 0.1

## 2020-03-12 MED ORDER — FLUTICASONE PROPIONATE 50 MCG/ACT NA SUSP
1.0000 | Freq: Every day | NASAL | Status: DC
  Administered 2020-03-12 – 2020-03-17 (×6): 1 via NASAL
  Filled 2020-03-12: qty 16

## 2020-03-12 MED ORDER — INSULIN DETEMIR 100 UNIT/ML ~~LOC~~ SOLN
25.0000 [IU] | Freq: Two times a day (BID) | SUBCUTANEOUS | Status: DC
  Administered 2020-03-12: 25 [IU] via SUBCUTANEOUS
  Filled 2020-03-12 (×2): qty 0.25

## 2020-03-12 MED ORDER — LINAGLIPTIN 5 MG PO TABS
5.0000 mg | ORAL_TABLET | Freq: Every day | ORAL | Status: DC
  Administered 2020-03-12 – 2020-03-17 (×6): 5 mg via ORAL
  Filled 2020-03-12 (×6): qty 1

## 2020-03-12 MED ORDER — INSULIN ASPART 100 UNIT/ML ~~LOC~~ SOLN
25.0000 [IU] | Freq: Once | SUBCUTANEOUS | Status: AC
  Administered 2020-03-12: 25 [IU] via SUBCUTANEOUS
  Filled 2020-03-12: qty 1

## 2020-03-12 MED ORDER — SODIUM CHLORIDE 0.9 % IV SOLN: INTRAVENOUS | Status: AC

## 2020-03-12 NOTE — Progress Notes (Signed)
PROGRESS NOTE    Stephanie [name]  AQT:[id number] DOB: 03-29-1950 DOA: 03/11/2020 PCP: [name], FNP   Chief Complaint  Patient presents with  . Emesis  . hypoxia    Brief Narrative:  As per H&P written by Dr. Denton Brick on 03/11/2020 Stephanie [name] is a 70 y.o. female with medical history significant for COPD, hypertension, hypothyroidism, diabetes mellitus, DVT. Presented to the ED with complaints of fevers, nasal congestion, vomiting, cough of about 6 days duration.  Difficulty breathing started 2 days ago.  She is vaccinated for [employer], but she did not get the booster dose.  She has some mild dull central chest pain from coughing.  No lower extremity swelling. Patient is on Eliquis, but she reports she has not taking Eliquis in about 2 weeks because she was not feeling well.  ED Course: Impression 97.  Heart rate tachycardic to 123, respiratory rate 18-28.  Blood pressure systolic 562B to 638L.  O2 sat 78% on room air, requiring 5 L of O2 via nasal cannula-sats now 94 to 97%. D-dimer 1.99.  Procalcitonin 0.79.  Creatinine elevated 1.77.  WBC 12.1. BNP 68.  Lactic acid 2.5.  Portable chest x-ray subtle bilateral heterogeneous and interstitial airspace opacity concerning for infection. Steroids and remdesivir initiated.  Hospitalist to admit for Covid pneumonia.  Assessment & Plan: 1-acute respiratory failure with hypoxia secondary to COVID-19 pneumonia -Continue to follow inflammatory markers -Continue to wean off oxygen supplementation as tolerated -Continue as needed bronchodilators and supportive care -Continue the use of incentive spirometer, flutter valve and antitussive medication -Continue treatment with remdesivir and steroids -Given elevated procalcitonin will continue the use of biotics currently.  2-type 2 diabetes mellitus with hyperglycemia -As an outpatient patient uses xultophy and Humalog -Currently with hyperglycemia in the setting of steroids  usage -Continue holding outpatient therapy -Started on Tradjenta daily; also Patient has been started on sliding scale insulin, NovoLog for meal coverage and twice a day Levemir -Follow CBGs and further adjust hypoglycemic regimen as required. -A1c 9.2  3-Hypertension -Blood pressure soft on presentation -Continue holding antihypertensive agents currently -Follow vital signs -Resume home medications as needed when appropriate.  4-history of COPD (chronic obstructive pulmonary disease) (HCC) -No wheezing currently -Continue home inhaler management -Continue steroids and as needed bronchodilators as mentioned.  5-class I obesity -Low calorie diet, portion control increase physical activity discussed with patient. Body mass index is 34.56 kg/m.   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 -No lower extremity swelling or pain currently.  7-hypothyroidism -Continue Synthroid  8-acute kidney injury on chronic kidney disease a stage II -In the setting of prerenal azotemia: Dehydration and continue use of nephrotoxic agent--continue to minimize nephrotoxic agents -Avoid hypotension -Continue to maintain adequate hydration -Follow renal function trend. -Creatinine currently 1.25; baseline 1.1-1.2   DVT prophylaxis: Chronically on apixaban. Code Status: Full code Family Communication: No family at bedside.  Patient reports she will call daughter for updates using her cell phone. Disposition:   Status is: Inpatient  Dispo: The patient is from: Home              Anticipated d/c is to: Home              Anticipated d/c date is: 1-2 days; if otherwise stable with most likely oxygen supplementation and completion of remdesivir infusion after discharge.              Patient currently  no medically stable for discharge; still short winded, requiring 5 L nasal cannula supplementation and experiencing  intermittent coughing spells.  Patient also with uncontrolled hyperglycemia currently.    Consultants:   None   Procedures:  See below for x-ray reports   Antimicrobials/antiviral  Remdesivir 2/5   Subjective: Afebrile, no chest pain, no nausea or vomiting.  Still tachypneic, short winded with activity and reporting intermittent dry coughing spells.  Using 5 L nasal cannula supplementation.  Objective: Vitals:   03/12/20 0100 03/12/20 0300 03/12/20 0500 03/12/20 0730  BP: (!) 163/99 (!) 141/75 (!) 150/70 109/66  Pulse: 88 78  85  Resp: 15 (!) 25 (!) 25 12  Temp:      SpO2: 95% 96% 96% 96%  Weight:      Height:        Intake/Output Summary (Last 24 hours) at 03/12/2020 1035 Last data filed at 03/11/2020 1725 Gross per 24 hour  Intake 200 ml  Output -  Net 200 ml   Filed Weights   03/11/20 1933  Weight: 103.1 kg    Examination:  General exam: Appears in no major distress currently, reports slight improvement in her symptoms. Still with intermittent dry coughing spells and feeling short winded with activity.  Patient using 5 L nasal cannula supplementation currently. Respiratory system: Positive scattered rhonchi; no wheezing, no using accessory muscles. Cardiovascular system: S1 & S2 heard, sinus rhythm, no rubs, no gallops, no murmurs appreciated on exam.  No JVD. Gastrointestinal system: Abdomen is nondistended, soft and nontender. No organomegaly or masses felt. Normal bowel sounds heard. Central nervous system: Alert and oriented. No focal neurological deficits. Extremities: No cyanosis or clubbing. Skin: No petechiae. Psychiatry: Judgement and insight appear normal. Mood & affect appropriate.     Data Reviewed: I have personally reviewed following labs and imaging studies  CBC: Recent Labs  Lab 03/11/20 1359 03/12/20 0657  WBC 12.1* 6.4  NEUTROABS 10.2* 5.5  HGB 13.2 12.8  HCT 41.9 41.4  MCV 78.3* 79.9*  PLT 400 789    Basic Metabolic  Panel: Recent Labs  Lab 03/11/20 1353 03/11/20 1359 03/12/20 0657  NA  --  128* 133*  K  --  4.0 4.6  CL  --  93* 98  CO2  --  21* 24  GLUCOSE  --  478* 429*  BUN  --  53* 51*  CREATININE  --  1.77* 1.35*  CALCIUM  --  8.3* 8.4*  MG 2.5*  --  2.5*  PHOS  --   --  3.0    GFR: Estimated Creatinine Clearance: 49.4 mL/min (A) (by C-G formula based on SCr of 1.35 mg/dL (H)).  Liver Function Tests: Recent Labs  Lab 03/11/20 1359 03/12/20 0657  AST 40 26  ALT 44 34  ALKPHOS 279* 227*  BILITOT 1.0 0.6  PROT 8.9* 8.3*  ALBUMIN 2.5* 2.3*    CBG: Recent Labs  Lab 03/11/20 2104 03/11/20 2307 03/12/20 0542  GLUCAP 468* 417* 412*    Recent Results (from the past 240 hour(s))  Blood Culture (routine x 2)     Status: None (Preliminary result)   Collection Time: 03/11/20  1:45 PM   Specimen: BLOOD  Result Value Ref Range Status   Specimen Description BLOOD  Final   Special Requests NONE  Final   Culture   Final    NO GROWTH < 24 HOURS Performed at [facility], [address] [place], [place] [address]    Report Status PENDING  Incomplete  SARS Coronavirus 2 by RT PCR (hospital order, performed in [facility] hospital lab) Nasopharyngeal Nasopharyngeal Swab     Status: Abnormal   Collection Time: 03/11/20  1:47 PM   Specimen: Nasopharyngeal Swab  Result Value Ref Range Status   SARS Coronavirus 2 POSITIVE (A) NEGATIVE Final    Comment: RESULT CALLED TO, READ BACK BY AND VERIFIED WITH: MYRICK,B AT 1515 ON 03/11/20 BY HUFFINES,S. (NOTE) SARS-CoV-2 target nucleic acids are DETECTED  SARS-CoV-2 RNA is generally detectable in upper respiratory specimens  during the acute phase of infection.  Positive results are indicative  of the presence of the identified virus, but do not rule out bacterial infection or co-infection with other pathogens not detected by the test.  Clinical correlation with patient history and  other diagnostic information is necessary to determine  patient infection status.  The expected result is negative.  Fact Sheet for Patients:   StrictlyIdeas.no   Fact Sheet for Healthcare Providers:   BankingDealers.co.za    This test is not yet approved or cleared by the [place] FDA and  has been authorized for detection and/or diagnosis of SARS-CoV-2 by FDA under an Emergency Use Authorization (EUA).  This EUA will remain in effect (meanin g this test can be used) for the duration of  the COVID-19 declaration under Section 564(b)(1) of the Act, 21 U.S.C. section 360-bbb-3(b)(1), unless the authorization is terminated or revoked sooner.  Performed at [facility], [address] [place], [place] [address]   Blood Culture (routine x 2)     Status: None (Preliminary result)   Collection Time: 03/11/20  1:58 PM   Specimen: BLOOD  Result Value Ref Range Status   Specimen Description BLOOD LEFT ARM  Final   Special Requests   Final    BOTTLES DRAWN AEROBIC AND ANAEROBIC Blood Culture adequate volume   Culture   Final    NO GROWTH < 24 HOURS Performed at [facility], [address] [place], [place] [address]    Report Status PENDING  Incomplete     Radiology Studies: DG Chest Portable 1 View  Result Date: 03/11/2020 CLINICAL DATA:  Cough EXAM: PORTABLE CHEST 1 VIEW COMPARISON:  09/15/2017 FINDINGS: The heart size and mediastinal contours are within normal limits. Subtle bilateral heterogeneous and interstitial airspace opacity. The visualized skeletal structures are unremarkable. IMPRESSION: Subtle bilateral heterogeneous and interstitial airspace opacity, concerning for infection, including COVID-19 if clinically suspected. Electronically Signed   By: Eddie Candle M.D.   On: 03/11/2020 15:01    Scheduled Meds: . apixaban  5 mg Oral BID  . fluticasone  1 spray Each Nare Daily  . insulin aspart  0-15 Units Subcutaneous Q4H  . insulin detemir  16 Units Subcutaneous BID  .  levothyroxine  88 mcg Oral QAC breakfast  . methylPREDNISolone (SOLU-MEDROL) injection  50 mg Intravenous Q12H   Followed by  . [START ON 03/14/2020] predniSONE  50 mg Oral Daily  . nystatin   Topical BID   Continuous Infusions: . sodium chloride    . azithromycin    . cefTRIAXone (ROCEPHIN)  IV Stopped (03/12/20 0155)  . remdesivir 100 mg in NS 100 mL       LOS: 1 day    Time spent: 30 minutes   Barton Dubois, MD [facility]   To contact the attending provider between 7A-7P or the covering provider during after hours 7P-7A, please log into the web site [email or web address] and access using universal [place] password for  that web site. If you do not have the password, please call the hospital operator.  03/12/2020, 10:35 AM

## 2020-03-12 NOTE — Progress Notes (Signed)
TR[facility] night shift.  The nursing staff reports that the patient has a blood glucose of 412 mg/dL.  Yesterday evening around 2100, the patient's CBG was 468 mg/dL and then 417 mg/dL at 2307.  She is currently getting glucocorticoids due to COVID-19 disease.  She is on CBG monitoring with moderate RI SS every 4 hours.  I will order 20 units of NovoLog x1 dose now.  I will also start Levemir around 0.1 units/kg SQ twice a day.  Tennis Must, MD

## 2020-03-12 NOTE — ED Notes (Signed)
Pt sitting up in bed at this time eating lunch. pts O2 sats are 90% on 5L O2.

## 2020-03-12 NOTE — ED Notes (Signed)
Pt had BM in bed pain. Pt denies feeling SOB this morning. Vital signs stable. Will continue to monitor patient.

## 2020-03-12 NOTE — Progress Notes (Signed)
Inpatient Diabetes Program Recommendations  AACE/ADA: New Consensus Statement on Inpatient Glycemic Control (2015)  Target Ranges:  Prepandial:   less than 140 mg/dL      Peak postprandial:   less than 180 mg/dL (1-2 hours)      Critically ill patients:  140 - 180 mg/dL   Lab Results  Component Value Date   GLUCAP 412 (H) 03/12/2020   HGBA1C 9.2 (H) 03/11/2020    Review of Glycemic Control Results for ANALESE, SOVINE (MRN [id number]) as of 03/12/2020 09:26  Ref. Range 03/11/2020 21:04 03/11/2020 23:07 03/12/2020 05:42  Glucose-Capillary Latest Ref Range: 70 - 99 mg/dL 468 (H) 417 (H) 412 (H)   Diabetes history: Type 2 DM Outpatient Diabetes medications: Xultophy 75 units QD, Humalog 30-47 units B/S; 30-32 units L Current orders for Inpatient glycemic control: Novolog 0-15 units Q4H, Levemir 16 units BID Decadron 10 mg x1 Solumedrol 50 mg BID  Inpatient Diabetes Program Recommendations:    Consider further increasing: -Levemir to 25 unit BID -changing correction to Novolog 0-20 units TID now that patient has diet -Novolog 10 units TID (Assuming patient is consuming >50% of meals) -Tradjenta 5 mg QD  Thanks, Bronson Curb, MSN, RNC-OB Diabetes Coordinator [phone] (8a-5p)

## 2020-03-13 DIAGNOSIS — U071 COVID-19: Secondary | ICD-10-CM | POA: Diagnosis not present

## 2020-03-13 DIAGNOSIS — J1282 Pneumonia due to coronavirus disease 2019: Secondary | ICD-10-CM | POA: Diagnosis not present

## 2020-03-13 LAB — GLUCOSE, CAPILLARY
Glucose-Capillary: 114 mg/dL — ABNORMAL HIGH (ref 70–99)
Glucose-Capillary: 233 mg/dL — ABNORMAL HIGH (ref 70–99)
Glucose-Capillary: 317 mg/dL — ABNORMAL HIGH (ref 70–99)
Glucose-Capillary: 338 mg/dL — ABNORMAL HIGH (ref 70–99)
Glucose-Capillary: 343 mg/dL — ABNORMAL HIGH (ref 70–99)
Glucose-Capillary: 360 mg/dL — ABNORMAL HIGH (ref 70–99)
Glucose-Capillary: 379 mg/dL — ABNORMAL HIGH (ref 70–99)

## 2020-03-13 LAB — COMPREHENSIVE METABOLIC PANEL
ALT: 32 U/L (ref 0–44)
AST: 22 U/L (ref 15–41)
Albumin: 2.1 g/dL — ABNORMAL LOW (ref 3.5–5.0)
Alkaline Phosphatase: 203 U/L — ABNORMAL HIGH (ref 38–126)
Anion gap: 11 (ref 5–15)
BUN: 48 mg/dL — ABNORMAL HIGH (ref 8–23)
CO2: 25 mmol/L (ref 22–32)
Calcium: 8.4 mg/dL — ABNORMAL LOW (ref 8.9–10.3)
Chloride: 101 mmol/L (ref 98–111)
Creatinine, Ser: 1.25 mg/dL — ABNORMAL HIGH (ref 0.44–1.00)
GFR, Estimated: 47 mL/min — ABNORMAL LOW (ref >60)
Glucose, Bld: 369 mg/dL — ABNORMAL HIGH (ref 70–99)
Potassium: 4 mmol/L (ref 3.5–5.1)
Sodium: 137 mmol/L (ref 135–145)
Total Bilirubin: 0.4 mg/dL (ref 0.3–1.2)
Total Protein: 7.5 g/dL (ref 6.5–8.1)

## 2020-03-13 LAB — CBC WITH DIFFERENTIAL/PLATELET
Abs Immature Granulocytes: 0.1 10*3/uL — ABNORMAL HIGH (ref 0.00–0.07)
Basophils Absolute: 0 10*3/uL (ref 0.0–0.1)
Basophils Relative: 0 %
Eosinophils Absolute: 0 10*3/uL (ref 0.0–0.5)
Eosinophils Relative: 0 %
HCT: 42.3 % (ref 36.0–46.0)
Hemoglobin: 13.1 g/dL (ref 12.0–15.0)
Immature Granulocytes: 1 %
Lymphocytes Relative: 6 %
Lymphs Abs: 0.8 10*3/uL (ref 0.7–4.0)
MCH: 24.5 pg — ABNORMAL LOW (ref 26.0–34.0)
MCHC: 31 g/dL (ref 30.0–36.0)
MCV: 79.2 fL — ABNORMAL LOW (ref 80.0–100.0)
Monocytes Absolute: 0.4 10*3/uL (ref 0.1–1.0)
Monocytes Relative: 3 %
Neutro Abs: 11.4 10*3/uL — ABNORMAL HIGH (ref 1.7–7.7)
Neutrophils Relative %: 90 %
Platelets: 425 10*3/uL — ABNORMAL HIGH (ref 150–400)
RBC: 5.34 MIL/uL — ABNORMAL HIGH (ref 3.87–5.11)
RDW: 16.8 % — ABNORMAL HIGH (ref 11.5–15.5)
WBC: 12.7 10*3/uL — ABNORMAL HIGH (ref 4.0–10.5)
nRBC: 0 % (ref 0.0–0.2)

## 2020-03-13 LAB — D-DIMER, QUANTITATIVE: D-Dimer, Quant: 1.77 ug/mL-FEU — ABNORMAL HIGH (ref 0.00–0.50)

## 2020-03-13 LAB — C-REACTIVE PROTEIN: CRP: 17.4 mg/dL — ABNORMAL HIGH (ref <1.0)

## 2020-03-13 LAB — MAGNESIUM: Magnesium: 2.4 mg/dL (ref 1.7–2.4)

## 2020-03-13 LAB — FERRITIN: Ferritin: 847 ng/mL — ABNORMAL HIGH (ref 11–307)

## 2020-03-13 LAB — PHOSPHORUS: Phosphorus: 3 mg/dL (ref 2.5–4.6)

## 2020-03-13 MED ORDER — DULOXETINE HCL 30 MG PO CPEP
30.0000 mg | ORAL_CAPSULE | Freq: Two times a day (BID) | ORAL | Status: DC
  Administered 2020-03-13 – 2020-03-17 (×8): 30 mg via ORAL
  Filled 2020-03-13 (×8): qty 1

## 2020-03-13 MED ORDER — INSULIN ASPART 100 UNIT/ML ~~LOC~~ SOLN
15.0000 [IU] | Freq: Three times a day (TID) | SUBCUTANEOUS | Status: DC
  Administered 2020-03-13 – 2020-03-15 (×7): 15 [IU] via SUBCUTANEOUS

## 2020-03-13 MED ORDER — TRAMADOL HCL 50 MG PO TABS
50.0000 mg | ORAL_TABLET | Freq: Three times a day (TID) | ORAL | Status: DC | PRN
  Administered 2020-03-13 – 2020-03-17 (×7): 50 mg via ORAL
  Filled 2020-03-13 (×7): qty 1

## 2020-03-13 MED ORDER — PREGABALIN 75 MG PO CAPS
75.0000 mg | ORAL_CAPSULE | Freq: Two times a day (BID) | ORAL | Status: DC
  Administered 2020-03-13 – 2020-03-17 (×8): 75 mg via ORAL
  Filled 2020-03-13 (×8): qty 1

## 2020-03-13 MED ORDER — METOPROLOL TARTRATE 25 MG PO TABS
25.0000 mg | ORAL_TABLET | Freq: Two times a day (BID) | ORAL | Status: DC
  Administered 2020-03-13 – 2020-03-17 (×8): 25 mg via ORAL
  Filled 2020-03-13 (×8): qty 1

## 2020-03-13 MED ORDER — INSULIN ASPART 100 UNIT/ML ~~LOC~~ SOLN
0.0000 [IU] | SUBCUTANEOUS | Status: DC
  Administered 2020-03-13: 7 [IU] via SUBCUTANEOUS
  Administered 2020-03-13: 20 [IU] via SUBCUTANEOUS
  Administered 2020-03-13: 15 [IU] via SUBCUTANEOUS
  Administered 2020-03-13: 20 [IU] via SUBCUTANEOUS
  Administered 2020-03-14: 11 [IU] via SUBCUTANEOUS
  Administered 2020-03-14: 3 [IU] via SUBCUTANEOUS
  Administered 2020-03-14: 7 [IU] via SUBCUTANEOUS
  Administered 2020-03-14: 3 [IU] via SUBCUTANEOUS
  Administered 2020-03-14: 11 [IU] via SUBCUTANEOUS
  Administered 2020-03-15: 7 [IU] via SUBCUTANEOUS
  Administered 2020-03-15: 4 [IU] via SUBCUTANEOUS
  Administered 2020-03-15 (×2): 11 [IU] via SUBCUTANEOUS
  Administered 2020-03-15: 7 [IU] via SUBCUTANEOUS
  Administered 2020-03-16: 20 [IU] via SUBCUTANEOUS
  Administered 2020-03-16: 7 [IU] via SUBCUTANEOUS
  Administered 2020-03-16 (×2): 20 [IU] via SUBCUTANEOUS
  Administered 2020-03-17 (×3): 4 [IU] via SUBCUTANEOUS
  Administered 2020-03-17: 15 [IU] via SUBCUTANEOUS

## 2020-03-13 MED ORDER — TRAZODONE HCL 50 MG PO TABS
50.0000 mg | ORAL_TABLET | Freq: Every day | ORAL | Status: DC
Start: 2020-03-13 — End: 2020-03-17
  Administered 2020-03-13 – 2020-03-16 (×4): 50 mg via ORAL
  Filled 2020-03-13 (×4): qty 1

## 2020-03-13 MED ORDER — PANTOPRAZOLE SODIUM 40 MG PO TBEC
40.0000 mg | DELAYED_RELEASE_TABLET | Freq: Two times a day (BID) | ORAL | Status: DC
  Administered 2020-03-13 – 2020-03-17 (×8): 40 mg via ORAL
  Filled 2020-03-13 (×8): qty 1

## 2020-03-13 MED ORDER — INSULIN DETEMIR 100 UNIT/ML ~~LOC~~ SOLN
30.0000 [IU] | Freq: Two times a day (BID) | SUBCUTANEOUS | Status: DC
  Administered 2020-03-13 – 2020-03-15 (×5): 30 [IU] via SUBCUTANEOUS
  Filled 2020-03-13 (×11): qty 0.3

## 2020-03-13 MED ORDER — PRAVASTATIN SODIUM 40 MG PO TABS
80.0000 mg | ORAL_TABLET | Freq: Every day | ORAL | Status: DC
  Administered 2020-03-13 – 2020-03-16 (×4): 80 mg via ORAL
  Filled 2020-03-13 (×4): qty 2

## 2020-03-13 NOTE — Progress Notes (Signed)
PROGRESS NOTE    Stephanie [name]  ZHY:[id number] DOB: 02/26/1950 DOA: 03/11/2020 PCP: [name], FNP   Brief Narrative:  As per H&P written by Dr. Denton Brick on 03/11/2020 Stephanie Husbands Doeis a 70 y.o.femalewith medical history significant forCOPD, hypertension, hypothyroidism, diabetes mellitus, DVT. Presented to the ED with complaints of fevers, nasal congestion, vomiting, cough of about 6 days duration. Difficulty breathing started 2 days ago. She is vaccinated for [employer], butshe did not get the booster dose. She has some mild dull central chest pain from coughing. No lower extremity swelling. Patient is on Eliquis, but she reports she has not taking Eliquis in about 2 weeks because she was not feeling well.  ED Course:Impression 97. Heart rate tachycardic to 123, respiratory rate 18-28. Blood pressure systolic 295M to 841L. O2 sat 78% on room air, requiring 5 L of O2via nasal cannula-sats now 94 to 97%. D-dimer 1.99. Procalcitonin 0.79. Creatinine elevated 1.77. WBC 12.1. BNP 68.Lactic acid 2.5. Portable chest x-ray subtle bilateral heterogeneous and interstitial airspace opacity concerning for infection. Steroids and remdesivir initiated. Hospitalist to admit for Covid pneumonia.   Assessment & Plan:   Principal Problem:   Pneumonia due to COVID-19 virus Active Problems:   Hypertension   COPD (chronic obstructive pulmonary disease) (HCC)   Diabetes (HCC)   Deep vein thrombosis (DVT) of both lower extremities (HCC)   Hypothyroidism   MGUS (monoclonal gammopathy of unknown significance)   Acute respiratory disease due to COVID-19 virus   1-acute respiratory failure with hypoxia secondary to COVID-19 pneumonia -Continue to follow inflammatory markers -Continue to wean off oxygen supplementation as tolerated -Continue as needed bronchodilators and supportive care -Continue the use of incentive spirometer, flutter valve and antitussive  medication -Continue treatment with remdesivir and steroids -Given elevated procalcitonin will continue the use of biotics currently.  2-type 2 diabetes mellitus with hyperglycemia -As an outpatient patient uses xultophy and Humalog -Currently with hyperglycemia in the setting of steroids usage -Continue holding outpatient therapy -Started on Tradjenta daily; also Patient has been started on sliding scale insulin, NovoLog for meal coverage and twice a day Levemir -Follow CBGs and further adjust hypoglycemic regimen as required. -A1c 9.2  3-Hypertension -Blood pressure soft on presentation -Continue holding antihypertensive agents currently -Follow vital signs -Plan to resume some home blood pressure agents today  4-history of COPD (chronic obstructive pulmonary disease) (HCC) -No wheezing currently -Continue home inhaler management -Continue steroids and as needed bronchodilators as mentioned.  5-class I obesity -Low calorie diet, portion control increase physical activity discussed with patient. Body mass index is 34.56 kg/m.   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 -No lower extremity swelling or pain currently.  7-hypothyroidism -Continue Synthroid  8-acute kidney injury on chronic kidney disease a stage II -In the setting of prerenal azotemia: Dehydration and continue use of nephrotoxic agent--continue to minimize nephrotoxic agents -Avoid hypotension -Continue to maintain adequate hydration -Follow renal function trend. -Creatinine currently 1.25; baseline 1.1-1.2   DVT prophylaxis: Chronically on apixaban. Code Status: Full code Family Communication: No family at bedside.  Patient reports she will call daughter for updates using her cell phone. Disposition:   Status is: Inpatient  Dispo: The patient is from: Home  Anticipated d/c is to: Home   Anticipated d/c date is: 1-2 days; if otherwise stable with most likely oxygen supplementation and completion of remdesivir infusion after discharge.  Patient currently no medically stable for discharge; still short  winded, requiring 5 L nasal cannula supplementation and experiencing intermittent coughing spells.  Patient also with uncontrolled hyperglycemia currently.    Consultants:   None   Procedures:  See below for x-ray reports   Antimicrobials/antiviral  Remdesivir 3/5   Subjective: Patient seen and evaluated today with headache this a.m. and would like her tramadol resumed. No acute concerns or events noted overnight.  Objective: Vitals:   03/12/20 2009 03/13/20 0522 03/13/20 0700 03/13/20 0800  BP: (!) 143/86 (!) 150/88    Pulse: 94 90 (!) 102 98  Resp: 19 19 (!) 21 (!) 21  Temp: 97.9 F (36.6 C) 98.3 F (36.8 C)    TempSrc: Oral     SpO2: 95% 100% 90% 92%  Weight:      Height:        Intake/Output Summary (Last 24 hours) at 03/13/2020 1508 Last data filed at 03/13/2020 1300 Gross per 24 hour  Intake 3108.58 ml  Output 750 ml  Net 2358.58 ml   Filed Weights   03/11/20 1933  Weight: 103.1 kg    Examination:  General exam: Appears calm and comfortable  Respiratory system: Clear to auscultation. Respiratory effort normal.  Currently on 5 L nasal cannula oxygen. Cardiovascular system: S1 & S2 heard, RRR.  Gastrointestinal system: Abdomen is soft Central nervous system: Alert and awake Extremities: No edema Skin: No significant lesions noted Psychiatry: Flat affect.    Data Reviewed: I have personally reviewed following labs and imaging studies  CBC: Recent Labs  Lab 03/11/20 1359 03/12/20 0657 03/13/20 0521  WBC 12.1* 6.4 12.7*  NEUTROABS 10.2* 5.5 11.4*  HGB 13.2 12.8 13.1  HCT 41.9 41.4 42.3  MCV 78.3* 79.9* 79.2*  PLT 400 345 967*   Basic Metabolic Panel: Recent Labs  Lab 03/11/20 1353 03/11/20 1359 03/12/20 0657  03/13/20 0521  NA  --  128* 133* 137  K  --  4.0 4.6 4.0  CL  --  93* 98 101  CO2  --  21* 24 25  GLUCOSE  --  478* 429* 369*  BUN  --  53* 51* 48*  CREATININE  --  1.77* 1.35* 1.25*  CALCIUM  --  8.3* 8.4* 8.4*  MG 2.5*  --  2.5* 2.4  PHOS  --   --  3.0 3.0   GFR: Estimated Creatinine Clearance: 53.4 mL/min (A) (by C-G formula based on SCr of 1.25 mg/dL (H)). Liver Function Tests: Recent Labs  Lab 03/11/20 1359 03/12/20 0657 03/13/20 0521  AST 40 26 22  ALT 44 34 32  ALKPHOS 279* 227* 203*  BILITOT 1.0 0.6 0.4  PROT 8.9* 8.3* 7.5  ALBUMIN 2.5* 2.3* 2.1*   No results for input(s): LIPASE, AMYLASE in the last 168 hours. No results for input(s): AMMONIA in the last 168 hours. Coagulation Profile: No results for input(s): INR, PROTIME in the last 168 hours. Cardiac Enzymes: No results for input(s): CKTOTAL, CKMB, CKMBINDEX, TROPONINI in the last 168 hours. BNP (last 3 results) No results for input(s): PROBNP in the last 8760 hours. HbA1C: Recent Labs    03/11/20 1353  HGBA1C 9.2*   CBG: Recent Labs  Lab 03/12/20 2009 03/12/20 2359 03/13/20 0521 03/13/20 0727 03/13/20 1101  GLUCAP 388* 343* 338* 317* 379*   Lipid Profile: Recent Labs    03/11/20 1353  TRIG 183*   Thyroid Function Tests: No results for input(s): TSH, T4TOTAL, FREET4, T3FREE, THYROIDAB in the last 72 hours. Anemia Panel: Recent Labs    03/12/20  9675 03/13/20 0521  FERRITIN 770* 847*   Sepsis Labs: Recent Labs  Lab 03/11/20 1353 03/11/20 1435 03/11/20 1819  PROCALCITON 0.79  --   --   LATICACIDVEN  --  2.5* 2.0*    Recent Results (from the past 240 hour(s))  Blood Culture (routine x 2)     Status: None (Preliminary result)   Collection Time: 03/11/20  1:45 PM   Specimen: BLOOD  Result Value Ref Range Status   Specimen Description BLOOD  Final   Special Requests NONE  Final   Culture   Final    NO GROWTH 2 DAYS Performed at [facility], [address], [place], [place]  [address]    Report Status PENDING  Incomplete  SARS Coronavirus 2 by RT PCR (hospital order, performed in [facility] hospital lab) Nasopharyngeal Nasopharyngeal Swab     Status: Abnormal   Collection Time: 03/11/20  1:47 PM   Specimen: Nasopharyngeal Swab  Result Value Ref Range Status   SARS Coronavirus 2 POSITIVE (A) NEGATIVE Final    Comment: RESULT CALLED TO, READ BACK BY AND VERIFIED WITH: MYRICK,B AT 4665 ON 03/11/20 BY HUFFINES,S. (NOTE) SARS-CoV-2 target nucleic acids are DETECTED  SARS-CoV-2 RNA is generally detectable in upper respiratory specimens  during the acute phase of infection.  Positive results are indicative  of the presence of the identified virus, but do not rule out bacterial infection or co-infection with other pathogens not detected by the test.  Clinical correlation with patient history and  other diagnostic information is necessary to determine patient infection status.  The expected result is negative.  Fact Sheet for Patients:   StrictlyIdeas.no   Fact Sheet for Healthcare Providers:   BankingDealers.co.za    This test is not yet approved or cleared by the [place] FDA and  has been authorized for detection and/or diagnosis of SARS-CoV-2 by FDA under an Emergency Use Authorization (EUA).  This EUA will remain in effect (meanin g this test can be used) for the duration of  the COVID-19 declaration under Section 564(b)(1) of the Act, 21 U.S.C. section 360-bbb-3(b)(1), unless the authorization is terminated or revoked sooner.  Performed at [facility], [address], [place], [place] [address]   Blood Culture (routine x 2)     Status: None (Preliminary result)   Collection Time: 03/11/20  1:58 PM   Specimen: BLOOD  Result Value Ref Range Status   Specimen Description BLOOD LEFT ARM  Final   Special Requests   Final    BOTTLES DRAWN AEROBIC AND ANAEROBIC Blood Culture adequate volume   Culture    Final    NO GROWTH 2 DAYS Performed at [facility], [address] [place], [place] [address]    Report Status PENDING  Incomplete         Radiology Studies: No results found.      Scheduled Meds: . apixaban  5 mg Oral BID  . fluticasone  1 spray Each Nare Daily  . insulin aspart  0-20 Units Subcutaneous Q4H  . insulin aspart  15 Units Subcutaneous TID WC  . insulin detemir  30 Units Subcutaneous BID  . levothyroxine  88 mcg Oral QAC breakfast  . linagliptin  5 mg Oral Daily  . methylPREDNISolone (SOLU-MEDROL) injection  50 mg Intravenous Q12H   Followed by  . [START ON 03/14/2020] predniSONE  50 mg Oral Daily  . nystatin   Topical BID   Continuous Infusions: . azithromycin Stopped (03/13/20 0915)  . cefTRIAXone (  ROCEPHIN)  IV Stopped (03/12/20 1750)  . remdesivir 100 mg in NS 100 mL Stopped (03/13/20 1113)     LOS: 2 days    Time spent: 35 minutes    Bynum Mccullars D Manuella Ghazi, DO [facility]  If 7PM-7AM, please contact night-coverage [email or web address] 03/13/2020, 3:08 PM

## 2020-03-13 NOTE — Progress Notes (Signed)
Pt ate 100% of lunch. Up to [facility] to void and for BM. Pt with noted dyspnea with movement, SaO2 down to 83-85% when up to [facility] and back to bed with HR up into 120's. With rest, HR down 100-105 and SaO2 back up to 89-90% on 5 lpm [place]. Pt performs purse-lipped breathing technique when feeling SOB. Occasional congested but non-productive cough noted. Pt now lying in bed, assumed right side-lying position as pt cannot prone. SaO2 89-90%.

## 2020-03-14 DIAGNOSIS — J1282 Pneumonia due to coronavirus disease 2019: Secondary | ICD-10-CM | POA: Diagnosis not present

## 2020-03-14 DIAGNOSIS — U071 COVID-19: Secondary | ICD-10-CM | POA: Diagnosis not present

## 2020-03-14 LAB — CBC WITH DIFFERENTIAL/PLATELET
Abs Immature Granulocytes: 0.11 10*3/uL — ABNORMAL HIGH (ref 0.00–0.07)
Basophils Absolute: 0 10*3/uL (ref 0.0–0.1)
Basophils Relative: 0 %
Eosinophils Absolute: 0 10*3/uL (ref 0.0–0.5)
Eosinophils Relative: 0 %
HCT: 39.5 % (ref 36.0–46.0)
Hemoglobin: 12.3 g/dL (ref 12.0–15.0)
Immature Granulocytes: 1 %
Lymphocytes Relative: 8 %
Lymphs Abs: 1 10*3/uL (ref 0.7–4.0)
MCH: 24.6 pg — ABNORMAL LOW (ref 26.0–34.0)
MCHC: 31.1 g/dL (ref 30.0–36.0)
MCV: 78.8 fL — ABNORMAL LOW (ref 80.0–100.0)
Monocytes Absolute: 0.7 10*3/uL (ref 0.1–1.0)
Monocytes Relative: 5 %
Neutro Abs: 11.6 10*3/uL — ABNORMAL HIGH (ref 1.7–7.7)
Neutrophils Relative %: 86 %
Platelets: 410 10*3/uL — ABNORMAL HIGH (ref 150–400)
RBC: 5.01 MIL/uL (ref 3.87–5.11)
RDW: 16.9 % — ABNORMAL HIGH (ref 11.5–15.5)
WBC: 13.4 10*3/uL — ABNORMAL HIGH (ref 4.0–10.5)
nRBC: 0 % (ref 0.0–0.2)

## 2020-03-14 LAB — GLUCOSE, CAPILLARY
Glucose-Capillary: 148 mg/dL — ABNORMAL HIGH (ref 70–99)
Glucose-Capillary: 146 mg/dL — ABNORMAL HIGH (ref 70–99)
Glucose-Capillary: 205 mg/dL — ABNORMAL HIGH (ref 70–99)
Glucose-Capillary: 256 mg/dL — ABNORMAL HIGH (ref 70–99)
Glucose-Capillary: 283 mg/dL — ABNORMAL HIGH (ref 70–99)

## 2020-03-14 LAB — D-DIMER, QUANTITATIVE: D-Dimer, Quant: 1.38 ug/mL-FEU — ABNORMAL HIGH (ref 0.00–0.50)

## 2020-03-14 LAB — COMPREHENSIVE METABOLIC PANEL
ALT: 33 U/L (ref 0–44)
AST: 35 U/L (ref 15–41)
Albumin: 2 g/dL — ABNORMAL LOW (ref 3.5–5.0)
Alkaline Phosphatase: 168 U/L — ABNORMAL HIGH (ref 38–126)
Anion gap: 6 (ref 5–15)
BUN: 44 mg/dL — ABNORMAL HIGH (ref 8–23)
CO2: 23 mmol/L (ref 22–32)
Calcium: 8.1 mg/dL — ABNORMAL LOW (ref 8.9–10.3)
Chloride: 106 mmol/L (ref 98–111)
Creatinine, Ser: 1.06 mg/dL — ABNORMAL HIGH (ref 0.44–1.00)
GFR, Estimated: 57 mL/min — ABNORMAL LOW (ref >60)
Glucose, Bld: 152 mg/dL — ABNORMAL HIGH (ref 70–99)
Potassium: 3.6 mmol/L (ref 3.5–5.1)
Sodium: 135 mmol/L (ref 135–145)
Total Bilirubin: 0.6 mg/dL (ref 0.3–1.2)
Total Protein: 6.8 g/dL (ref 6.5–8.1)

## 2020-03-14 LAB — MAGNESIUM: Magnesium: 2.2 mg/dL (ref 1.7–2.4)

## 2020-03-14 LAB — PHOSPHORUS: Phosphorus: 3.4 mg/dL (ref 2.5–4.6)

## 2020-03-14 LAB — C-REACTIVE PROTEIN: CRP: 6.1 mg/dL — ABNORMAL HIGH (ref <1.0)

## 2020-03-14 LAB — FERRITIN: Ferritin: 620 ng/mL — ABNORMAL HIGH (ref 11–307)

## 2020-03-14 MED ORDER — AZITHROMYCIN 250 MG PO TABS
500.0000 mg | ORAL_TABLET | Freq: Every day | ORAL | Status: AC
  Administered 2020-03-14 – 2020-03-16 (×3): 500 mg via ORAL
  Filled 2020-03-14 (×3): qty 2

## 2020-03-14 MED ORDER — PREDNISONE 20 MG PO TABS
50.0000 mg | ORAL_TABLET | Freq: Every day | ORAL | Status: DC
  Administered 2020-03-14: 50 mg via ORAL

## 2020-03-14 NOTE — Progress Notes (Signed)
PROGRESS NOTE    Stephanie [name]  BLT:[id number] DOB: [birth year] DOA: 03/11/2020 PCP: [name], FNP   Brief Narrative:  As per H&P written by Dr. Denton Brick on 03/11/2020 Stephanie [name] a 70 y.o.femalewith medical history significant forCOPD, hypertension, hypothyroidism, diabetes mellitus, DVT. Presented to the ED with complaints of fevers, nasal congestion, vomiting, cough of about 6 days duration. Difficulty breathing started 2 days ago. She is vaccinated for [employer], butshe did not get the booster dose. She has some mild dull central chest pain from coughing. No lower extremity swelling. Patient is on Eliquis, but she reports she has not taking Eliquis in about 2 weeks because she was not feeling well.  ED Course:Impression 97. Heart rate tachycardic to 123, respiratory rate 18-28. Blood pressure systolic 007M to 226J. O2 sat 78% on room air, requiring 5 L of O2via nasal cannula-sats now 94 to 97%. D-dimer 1.99. Procalcitonin 0.79. Creatinine elevated 1.77. WBC 12.1. BNP 68.Lactic acid 2.5. Portable chest x-ray subtle bilateral heterogeneous and interstitial airspace opacity concerning for infection. Steroids and remdesivir initiated. Hospitalist to admit for Covid pneumonia.   Assessment & Plan:   Principal Problem:   Pneumonia due to COVID-19 virus Active Problems:   Hypertension   COPD (chronic obstructive pulmonary disease) (HCC)   Diabetes ([place])   Deep vein thrombosis (DVT) of both lower extremities (HCC)   Hypothyroidism   MGUS (monoclonal gammopathy of unknown significance)   Acute respiratory disease due to COVID-19 virus   1-acute respiratory failure with hypoxia secondary to COVID-19 pneumonia -Continue to follow inflammatory markers -Continue to weanoffoxygen supplementation as tolerated -Continue as needed bronchodilators and supportive care -Continue the use of incentive spirometer, flutter valve and antitussive  medication -Continue treatment with remdesivir and steroids -Given elevated procalcitonin will continue the use of antibiotics currently.  2-type 2 diabetes mellitus with hyperglycemia -As an outpatient patient usesxultophyand Humalog -Currently with hyperglycemia in the setting of steroids usage -Continue holding outpatient therapy -Started on Tradjenta daily; alsoPatient has been started on sliding scale insulin, NovoLog for meal coverage and twice a day Levemir -Follow CBGs and further adjust hypoglycemic regimen as required. -A1c9.2  3-Hypertension -Blood pressure soft on presentation -Continue holding antihypertensive agents currently -Follow vital signs -Plan to resume some home blood pressure agents today  4-history ofCOPD (chronic obstructive pulmonary disease) (HCC) -No wheezing currently -Continue home inhaler management -Continue steroids and as needed bronchodilators as mentioned.  5-class I obesity -Low calorie diet, portion control increase physical activity discussed with patient. Body mass index is 34.56 kg/m.  6-history ofDeep vein thrombosis (DVT) of both lower extremities (HCC) -Resume home anticoagulation therapy using Eliquis. -No lower extremity swelling or pain currently.  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 -Creatinine currently 1.06;baseline 1.1-1.2   DVT prophylaxis:Chronically on apixaban. Code Status:Full code Family Communication:Discussed with daughter on phone 1/27 Disposition:  Status is: Inpatient  Dispo: The patient is from:Home Anticipated d/c is FH:LKTG Anticipated d/c date is: 1-2 days;if  otherwise stable with most likely oxygen supplementation and completion of remdesivir infusion after discharge. Patient currently no medically stable for discharge; still short winded, requiring 5 L nasal cannula supplementation and experiencing intermittent coughing spells.    Consultants:  None   Procedures: See below for x-ray reports   Antimicrobials/antiviral Remdesivir4/5  Subjective: Patient seen and evaluated today with no new acute complaints or concerns. No acute concerns or events noted overnight.  She continues to have shortness of breath with oxygen desaturations with any form of exertion.  Remains on 5 L nasal cannula oxygen and complains of some mild headaches.  Objective: Vitals:   03/13/20 1943 03/13/20 2012 03/14/20 0435 03/14/20 0750  BP:  (!) 142/75 132/68 135/85  Pulse:  74 83 85  Resp:  19 20 (!) 21  Temp:  97.9 F (36.6 C) 97.7 F (36.5 C)   TempSrc:   Oral   SpO2: 96% 100% 94% (!) 89%  Weight:      Height:        Intake/Output Summary (Last 24 hours) at 03/14/2020 1216 Last data filed at 03/14/2020 0900 Gross per 24 hour  Intake 1070 ml  Output 250 ml  Net 820 ml   Filed Weights   03/11/20 1933  Weight: 103.1 kg    Examination:  General exam: Appears calm and comfortable  Respiratory system: Clear to auscultation. Respiratory effort normal.  5 L nasal cannula oxygen. Cardiovascular system: S1 & S2 heard, RRR.  Gastrointestinal system: Abdomen is soft Central nervous system: Alert and awake Extremities: No edema Skin: No significant lesions noted Psychiatry: Flat affect.    Data Reviewed: I have personally reviewed following labs and imaging studies  CBC: Recent Labs  Lab 03/11/20 1359 03/12/20 0657 03/13/20 0521 03/14/20 0636  WBC 12.1* 6.4 12.7* 13.4*  NEUTROABS 10.2* 5.5 11.4* 11.6*  HGB 13.2 12.8 13.1 12.3  HCT 41.9 41.4 42.3 39.5  MCV 78.3* 79.9* 79.2* 78.8*  PLT 400 345 425* 410*   Basic  Metabolic Panel: Recent Labs  Lab 03/11/20 1353 03/11/20 1359 03/12/20 0657 03/13/20 0521 03/14/20 0636  NA  --  128* 133* 137 135  K  --  4.0 4.6 4.0 3.6  CL  --  93* 98 101 106  CO2  --  21* 24 25 23   GLUCOSE  --  478* 429* 369* 152*  BUN  --  53* 51* 48* 44*  CREATININE  --  1.77* 1.35* 1.25* 1.06*  CALCIUM  --  8.3* 8.4* 8.4* 8.1*  MG 2.5*  --  2.5* 2.4 2.2  PHOS  --   --  3.0 3.0 3.4   GFR: Estimated Creatinine Clearance: 62.9 mL/min (A) (by C-G formula based on SCr of 1.06 mg/dL (H)). Liver Function Tests: Recent Labs  Lab 03/11/20 1359 03/12/20 0657 03/13/20 0521 03/14/20 0636  AST 40 26 22 35  ALT 44 34 32 33  ALKPHOS 279* 227* 203* 168*  BILITOT 1.0 0.6 0.4 0.6  PROT 8.9* 8.3* 7.5 6.8  ALBUMIN 2.5* 2.3* 2.1* 2.0*   No results for input(s): LIPASE, AMYLASE in the last 168 hours. No results for input(s): AMMONIA in the last 168 hours. Coagulation Profile: No results for input(s): INR, PROTIME in the last 168 hours. Cardiac Enzymes: No results for input(s): CKTOTAL, CKMB, CKMBINDEX, TROPONINI in the last 168 hours. BNP (last 3 results) No results for input(s): PROBNP in the last 8760 hours. HbA1C: Recent Labs    03/11/20 1353  HGBA1C 9.2*   CBG: Recent Labs  Lab 03/13/20 2001 03/13/20 2345 03/14/20 0426 03/14/20 0737 03/14/20 1103  GLUCAP 233* 114* 148* 146* 205*   Lipid Profile: Recent Labs    03/11/20 1353  TRIG 183*   Thyroid Function Tests: No results for input(s): TSH, T4TOTAL, FREET4, T3FREE, THYROIDAB in the last 72 hours.  Anemia Panel: Recent Labs    03/13/20 0521 03/14/20 0636  FERRITIN 847* 620*   Sepsis Labs: Recent Labs  Lab 03/11/20 1353 03/11/20 1435 03/11/20 1819  PROCALCITON 0.79  --   --   LATICACIDVEN  --  2.5* 2.0*    Recent Results (from the past 240 hour(s))  Blood Culture (routine x 2)     Status: None (Preliminary result)   Collection Time: 03/11/20  1:45 PM   Specimen: BLOOD  Result Value Ref Range  Status   Specimen Description BLOOD  Final   Special Requests NONE  Final   Culture   Final    NO GROWTH 3 DAYS Performed at [facility], [address] [place], [place] [address]    Report Status PENDING  Incomplete  SARS Coronavirus 2 by RT PCR (hospital order, performed in [place] hospital lab) Nasopharyngeal Nasopharyngeal Swab     Status: Abnormal   Collection Time: 03/11/20  1:47 PM   Specimen: Nasopharyngeal Swab  Result Value Ref Range Status   SARS Coronavirus 2 POSITIVE (A) NEGATIVE Final    Comment: RESULT CALLED TO, READ BACK BY AND VERIFIED WITH: MYRICK,B AT 7341 ON 03/11/20 BY HUFFINES,S. (NOTE) SARS-CoV-2 target nucleic acids are DETECTED  SARS-CoV-2 RNA is generally detectable in upper respiratory specimens  during the acute phase of infection.  Positive results are indicative  of the presence of the identified virus, but do not rule out bacterial infection or co-infection with other pathogens not detected by the test.  Clinical correlation with patient history and  other diagnostic information is necessary to determine patient infection status.  The expected result is negative.  Fact Sheet for Patients:   StrictlyIdeas.no   Fact Sheet for Healthcare Providers:   BankingDealers.co.za    This test is not yet approved or cleared by the [place] FDA and  has been authorized for detection and/or diagnosis of SARS-CoV-2 by FDA under an Emergency Use Authorization (EUA).  This EUA will remain in effect (meanin g this test can be used) for the duration of  the COVID-19 declaration under Section 564(b)(1) of the Act, 21 U.S.C. section 360-bbb-3(b)(1), unless the authorization is terminated or revoked sooner.  Performed at [facility], [address] [place], [place] [address]   Blood Culture (routine x 2)     Status: None (Preliminary result)   Collection Time: 03/11/20  1:58 PM   Specimen: BLOOD  Result  Value Ref Range Status   Specimen Description BLOOD LEFT ARM  Final   Special Requests   Final    BOTTLES DRAWN AEROBIC AND ANAEROBIC Blood Culture adequate volume   Culture   Final    NO GROWTH 3 DAYS Performed at [facility], [address], [place],  [address]    Report Status PENDING  Incomplete         Radiology Studies: No results found.      Scheduled Meds: . apixaban  5 mg Oral BID  . azithromycin  500 mg Oral Daily  . DULoxetine  30 mg Oral BID  . fluticasone  1 spray Each Nare Daily  . insulin aspart  0-20 Units Subcutaneous Q4H  . insulin aspart  15 Units Subcutaneous TID WC  . insulin detemir  30 Units Subcutaneous BID  . levothyroxine  88 mcg Oral QAC breakfast  . linagliptin  5 mg Oral Daily  . methylPREDNISolone (SOLU-MEDROL) injection  50 mg Intravenous Q12H   Followed by  . predniSONE  50 mg  Oral Q breakfast  . metoprolol tartrate  25 mg Oral BID WC  . nystatin   Topical BID  . pantoprazole  40 mg Oral BID AC  . pravastatin  80 mg Oral QHS  . pregabalin  75 mg Oral BID  . traZODone  50 mg Oral QHS   Continuous Infusions: . cefTRIAXone (ROCEPHIN)  IV Stopped (03/13/20 2045)  . remdesivir 100 mg in NS 100 mL Stopped (03/13/20 1113)     LOS: 3 days    Time spent: 35 minutes    Aroush Chasse D Manuella Ghazi, DO [facility]  If 7PM-7AM, please contact night-coverage [email or web address] 03/14/2020, 12:16 PM

## 2020-03-14 NOTE — Progress Notes (Signed)
Pt's IV access unusable, occluded and unable to flush. IV removed. Unable to start new IV due to hard stick. SWOT nurse notified to obtain IV access. MD Manuella Ghazi aware.

## 2020-03-14 NOTE — Progress Notes (Signed)
New IV site started by SWOT nurse, pt tolerated well. IV med administered per order. Pt has been up to [facility], voided yellow urine and small brown BM type 6. Pt still very dyspneic with minimal exertion, SaO2 drops to mid 80's getting in and out of bed and onto [facility]. SaO2 recovers to low 90's with 5 -7 minutes of rest. Pt states she feels less SOB than yesterday but still feels tired after moving.

## 2020-03-15 ENCOUNTER — Inpatient Hospital Stay (HOSPITAL_COMMUNITY): Payer: Medicare Other

## 2020-03-15 DIAGNOSIS — U071 COVID-19: Secondary | ICD-10-CM | POA: Diagnosis not present

## 2020-03-15 DIAGNOSIS — J1282 Pneumonia due to coronavirus disease 2019: Secondary | ICD-10-CM | POA: Diagnosis not present

## 2020-03-15 LAB — CBC WITH DIFFERENTIAL/PLATELET
Abs Immature Granulocytes: 0.09 10*3/uL — ABNORMAL HIGH (ref 0.00–0.07)
Basophils Absolute: 0 10*3/uL (ref 0.0–0.1)
Basophils Relative: 0 %
Eosinophils Absolute: 0 10*3/uL (ref 0.0–0.5)
Eosinophils Relative: 0 %
HCT: 39.3 % (ref 36.0–46.0)
Hemoglobin: 12.2 g/dL (ref 12.0–15.0)
Immature Granulocytes: 1 %
Lymphocytes Relative: 10 %
Lymphs Abs: 1.1 10*3/uL (ref 0.7–4.0)
MCH: 24.7 pg — ABNORMAL LOW (ref 26.0–34.0)
MCHC: 31 g/dL (ref 30.0–36.0)
MCV: 79.6 fL — ABNORMAL LOW (ref 80.0–100.0)
Monocytes Absolute: 0.8 10*3/uL (ref 0.1–1.0)
Monocytes Relative: 7 %
Neutro Abs: 9.7 10*3/uL — ABNORMAL HIGH (ref 1.7–7.7)
Neutrophils Relative %: 82 %
Platelets: 400 10*3/uL (ref 150–400)
RBC: 4.94 MIL/uL (ref 3.87–5.11)
RDW: 17.1 % — ABNORMAL HIGH (ref 11.5–15.5)
WBC: 11.8 10*3/uL — ABNORMAL HIGH (ref 4.0–10.5)
nRBC: 0 % (ref 0.0–0.2)

## 2020-03-15 LAB — GLUCOSE, CAPILLARY
Glucose-Capillary: 177 mg/dL — ABNORMAL HIGH (ref 70–99)
Glucose-Capillary: 240 mg/dL — ABNORMAL HIGH (ref 70–99)
Glucose-Capillary: 276 mg/dL — ABNORMAL HIGH (ref 70–99)
Glucose-Capillary: 300 mg/dL — ABNORMAL HIGH (ref 70–99)

## 2020-03-15 LAB — BRAIN NATRIURETIC PEPTIDE: B Natriuretic Peptide: 59 pg/mL (ref 0.0–100.0)

## 2020-03-15 LAB — PHOSPHORUS: Phosphorus: 3.4 mg/dL (ref 2.5–4.6)

## 2020-03-15 LAB — COMPREHENSIVE METABOLIC PANEL
ALT: 52 U/L — ABNORMAL HIGH (ref 0–44)
AST: 59 U/L — ABNORMAL HIGH (ref 15–41)
Albumin: 2 g/dL — ABNORMAL LOW (ref 3.5–5.0)
Alkaline Phosphatase: 177 U/L — ABNORMAL HIGH (ref 38–126)
Anion gap: 9 (ref 5–15)
BUN: 45 mg/dL — ABNORMAL HIGH (ref 8–23)
CO2: 23 mmol/L (ref 22–32)
Calcium: 8.1 mg/dL — ABNORMAL LOW (ref 8.9–10.3)
Chloride: 103 mmol/L (ref 98–111)
Creatinine, Ser: 1.18 mg/dL — ABNORMAL HIGH (ref 0.44–1.00)
GFR, Estimated: 50 mL/min — ABNORMAL LOW (ref >60)
Glucose, Bld: 183 mg/dL — ABNORMAL HIGH (ref 70–99)
Potassium: 3.5 mmol/L (ref 3.5–5.1)
Sodium: 135 mmol/L (ref 135–145)
Total Bilirubin: 0.4 mg/dL (ref 0.3–1.2)
Total Protein: 6.5 g/dL (ref 6.5–8.1)

## 2020-03-15 LAB — C-REACTIVE PROTEIN: CRP: 4.5 mg/dL — ABNORMAL HIGH (ref <1.0)

## 2020-03-15 LAB — MAGNESIUM: Magnesium: 2.1 mg/dL (ref 1.7–2.4)

## 2020-03-15 LAB — D-DIMER, QUANTITATIVE: D-Dimer, Quant: 1.14 ug/mL-FEU — ABNORMAL HIGH (ref 0.00–0.50)

## 2020-03-15 LAB — FERRITIN: Ferritin: 568 ng/mL — ABNORMAL HIGH (ref 11–307)

## 2020-03-15 MED ORDER — INSULIN DETEMIR 100 UNIT/ML ~~LOC~~ SOLN
38.0000 [IU] | Freq: Two times a day (BID) | SUBCUTANEOUS | Status: DC
  Administered 2020-03-15 – 2020-03-17 (×4): 38 [IU] via SUBCUTANEOUS
  Filled 2020-03-15 (×6): qty 0.38

## 2020-03-15 MED ORDER — INSULIN ASPART 100 UNIT/ML ~~LOC~~ SOLN
18.0000 [IU] | Freq: Three times a day (TID) | SUBCUTANEOUS | Status: DC
  Administered 2020-03-15 – 2020-03-17 (×7): 18 [IU] via SUBCUTANEOUS

## 2020-03-15 MED ORDER — TORSEMIDE 20 MG PO TABS
40.0000 mg | ORAL_TABLET | Freq: Every day | ORAL | Status: DC
  Administered 2020-03-15 – 2020-03-17 (×3): 40 mg via ORAL
  Filled 2020-03-15 (×3): qty 2

## 2020-03-15 MED ORDER — METHYLPREDNISOLONE SODIUM SUCC 125 MG IJ SOLR
50.0000 mg | Freq: Two times a day (BID) | INTRAMUSCULAR | Status: DC
  Administered 2020-03-15 – 2020-03-17 (×5): 50 mg via INTRAVENOUS
  Filled 2020-03-15 (×5): qty 2

## 2020-03-15 MED ORDER — METOLAZONE 5 MG PO TABS
10.0000 mg | ORAL_TABLET | Freq: Every day | ORAL | Status: DC
  Administered 2020-03-15 – 2020-03-16 (×2): 10 mg via ORAL
  Filled 2020-03-15 (×2): qty 2

## 2020-03-15 NOTE — Progress Notes (Signed)
PROGRESS NOTE    Stephanie [name]  JJO:[id number] DOB: [birth year] DOA: 03/11/2020 PCP: [name], FNP   Brief Narrative:  As per H&P written by Dr. Denton Brick on 03/11/2020 Stephanie Husbands Doeis a 70 y.o.femalewith medical history significant forCOPD, hypertension, hypothyroidism, diabetes mellitus, DVT. Presented to the ED with complaints of fevers, nasal congestion, vomiting, cough of about 6 days duration. Difficulty breathing started 2 days ago. She is vaccinated for [employer], butshe did not get the booster dose. She has some mild dull central chest pain from coughing. No lower extremity swelling. Patient is on Eliquis, but she reports she has not taking Eliquis in about 2 weeks because she was not feeling well.  ED Course:Impression 97. Heart rate tachycardic to 123, respiratory rate 18-28. Blood pressure systolic 160F to 093A. O2 sat 78% on room air, requiring 5 L of O2via nasal cannula-sats now 94 to 97%. D-dimer 1.99. Procalcitonin 0.79. Creatinine elevated 1.77. WBC 12.1. BNP 68.Lactic acid 2.5. Portable chest x-ray subtle bilateral heterogeneous and interstitial airspace opacity concerning for infection. Steroids and remdesivir initiated. Hospitalist to admit for Covid pneumonia.  Assessment & Plan:   Principal Problem:   Pneumonia due to COVID-19 virus Active Problems:   Hypertension   COPD (chronic obstructive pulmonary disease) (HCC)   Diabetes ([place])   Deep vein thrombosis (DVT) of both lower extremities (HCC)   Hypothyroidism   MGUS (monoclonal gammopathy of unknown significance)   Acute respiratory disease due to COVID-19 virus   1-acute respiratory failure with hypoxia secondary to COVID-19 pneumonia -Continue to follow inflammatory markers -Continue to weanoffoxygen supplementation as tolerated -Continue as needed bronchodilators and supportive care -Continue the use of incentive spirometer, flutter valve and antitussive medication -Continue  treatment with remdesivir and steroids -Given elevated procalcitonin will continue the use of antibiotics currently. -Chest x-ray stable and BNP is low, resume home diuretics today with torsemide and metolazone, started 1/28 -Plan to obtain 2D echocardiogram in a.m. if symptoms continue to persist  2-type 2 diabetes mellitus with hyperglycemia -As an outpatient patient usesxultophyand Humalog -Currently with hyperglycemia in the setting of steroids usage -Continue holding outpatient therapy -Started on Tradjenta daily; alsoPatient has been started on sliding scale insulin, NovoLog for meal coverage and twice a day Levemir -Follow CBGs and further adjust hypoglycemic regimen as required. -A1c9.2  3-Hypertension -Blood pressure soft on presentation, now elevated -Continue holding antihypertensive agents currently -Follow vital signs -Plan to resume home diuretics today  4-history ofCOPD (chronic obstructive pulmonary disease) (HCC) -No wheezing currently -Continue home inhaler management -Continue steroids and as needed bronchodilators as mentioned.  5-class I obesity -Low calorie diet, portion control increase physical activity discussed with patient. Body mass index is 34.56 kg/m.  6-history ofDeep vein thrombosis (DVT) of both lower extremities (HCC) -Resume home anticoagulation therapy using Eliquis. -No lower extremity swelling or pain currently.  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 -Creatinine currently 1.18;baseline 1.1-1.2   DVT prophylaxis:Chronically on apixaban. Code Status:Full code Family Communication:Discussed with daughter on phone  1/28 Disposition:  Status is: Inpatient  Dispo: The patient is from:Home Anticipated d/c is TF:TDDU Anticipated d/c date is: 1-2 days;if otherwise stable with most likely oxygen supplementation and completion of remdesivir infusion after discharge. Patient currently no medically stable for discharge; still short winded, requiring 5 L  nasal cannula supplementation and experiencing intermittent coughing spells. May require 2D echocardiogram for further evaluation.   Consultants:  None   Procedures: See below for x-ray reports   Antimicrobials/antiviral Remdesivir5/5-completed 1/28   Subjective: Patient seen and evaluated today with ongoing shortness of breath and worsening hypoxemia particularly with exertion.  Objective: Vitals:   03/14/20 2024 03/14/20 2024 03/14/20 2025 03/15/20 0418  BP: (!) 141/77 (!) 141/77 (!) 141/77 133/63  Pulse: 73 77 73 75  Resp: 20 20 20 18   Temp: (!) 97.5 F (36.4 C) (!) 97.5 F (36.4 C) (!) 97.5 F (36.4 C) (!) 97.5 F (36.4 C)  TempSrc: Oral Oral Oral Oral  SpO2:  93% 93% 100%  Weight:      Height:        Intake/Output Summary (Last 24 hours) at 03/15/2020 1155 Last data filed at 03/15/2020 0917 Gross per 24 hour  Intake 900 ml  Output --  Net 900 ml   Filed Weights   03/11/20 1933  Weight: 103.1 kg    Examination:  General exam: Appears calm and comfortable  Respiratory system: Clear to auscultation. Respiratory effort normal.  Currently on 5 L nasal cannula oxygen. Cardiovascular system: S1 & S2 heard, RRR.  Gastrointestinal system: Abdomen is soft Central nervous system: Alert and awake Extremities: No edema Skin: No significant lesions noted Psychiatry: Flat affect.    Data Reviewed: I have personally reviewed following labs and imaging studies  CBC: Recent Labs  Lab 03/11/20 1359 03/12/20 0657 03/13/20 0521 03/14/20 0636 03/15/20 0400  WBC 12.1* 6.4  12.7* 13.4* 11.8*  NEUTROABS 10.2* 5.5 11.4* 11.6* 9.7*  HGB 13.2 12.8 13.1 12.3 12.2  HCT 41.9 41.4 42.3 39.5 39.3  MCV 78.3* 79.9* 79.2* 78.8* 79.6*  PLT 400 345 425* 410* 329   Basic Metabolic Panel: Recent Labs  Lab 03/11/20 1353 03/11/20 1359 03/12/20 0657 03/13/20 0521 03/14/20 0636 03/15/20 0400  NA  --  128* 133* 137 135 135  K  --  4.0 4.6 4.0 3.6 3.5  CL  --  93* 98 101 106 103  CO2  --  21* 24 25 23 23   GLUCOSE  --  478* 429* 369* 152* 183*  BUN  --  53* 51* 48* 44* 45*  CREATININE  --  1.77* 1.35* 1.25* 1.06* 1.18*  CALCIUM  --  8.3* 8.4* 8.4* 8.1* 8.1*  MG 2.5*  --  2.5* 2.4 2.2 2.1  PHOS  --   --  3.0 3.0 3.4 3.4   GFR: Estimated Creatinine Clearance: 56.5 mL/min (A) (by C-G formula based on SCr of 1.18 mg/dL (H)). Liver Function Tests: Recent Labs  Lab 03/11/20 1359 03/12/20 0657 03/13/20 0521 03/14/20 0636 03/15/20 0400  AST 40 26 22 35 59*  ALT 44 34 32 33 52*  ALKPHOS 279* 227* 203* 168* 177*  BILITOT 1.0 0.6 0.4 0.6 0.4  PROT 8.9* 8.3* 7.5 6.8 6.5  ALBUMIN 2.5* 2.3* 2.1* 2.0* 2.0*   No results for input(s): LIPASE, AMYLASE in the last 168 hours. No results for input(s): AMMONIA in the last 168 hours. Coagulation Profile: No results for input(s): INR, PROTIME in the last 168 hours. Cardiac Enzymes: No results for input(s): CKTOTAL, CKMB, CKMBINDEX, TROPONINI in the last 168 hours. BNP (last 3 results) No results for input(s): PROBNP in the last 8760 hours. HbA1C: No results for input(s): HGBA1C in the last 72 hours. CBG: Recent Labs  Lab 03/14/20 1103 03/14/20 1723 03/14/20 2030 03/15/20 0007 03/15/20 0404  GLUCAP 205* 256* 283* 300* 177*   Lipid Profile: No results for input(s): CHOL, HDL, LDLCALC, TRIG, CHOLHDL, LDLDIRECT in the last 72 hours. Thyroid Function Tests: No results for input(s): TSH, T4TOTAL, FREET4, T3FREE, THYROIDAB in the last 72 hours. Anemia Panel: Recent Labs    03/14/20 0636 03/15/20 0400  FERRITIN 620*  568*   Sepsis Labs: Recent Labs  Lab 03/11/20 1353 03/11/20 1435 03/11/20 1819  PROCALCITON 0.79  --   --   LATICACIDVEN  --  2.5* 2.0*    Recent Results (from the past 240 hour(s))  Blood Culture (routine x 2)     Status: None (Preliminary result)   Collection Time: 03/11/20  1:45 PM   Specimen: BLOOD  Result Value Ref Range Status   Specimen Description BLOOD  Final   Special Requests NONE  Final   Culture   Final    NO GROWTH 4 DAYS Performed at [facility], [address] [place], [place] [address]    Report Status PENDING  Incomplete  SARS Coronavirus 2 by RT PCR (hospital order, performed in [place] hospital lab) Nasopharyngeal Nasopharyngeal Swab     Status: Abnormal   Collection Time: 03/11/20  1:47 PM   Specimen: Nasopharyngeal Swab  Result Value Ref Range Status   SARS Coronavirus 2 POSITIVE (A) NEGATIVE Final    Comment: RESULT CALLED TO, READ BACK BY AND VERIFIED WITH: MYRICK,B AT 2035 ON 03/11/20 BY HUFFINES,S. (NOTE) SARS-CoV-2 target nucleic acids are DETECTED  SARS-CoV-2 RNA is generally detectable in upper respiratory specimens  during the acute phase of infection.  Positive results are indicative  of the presence of the identified virus, but do not rule out bacterial infection or co-infection with other pathogens not detected by the test.  Clinical correlation with patient history and  other diagnostic information is necessary to determine patient infection status.  The expected result is negative.  Fact Sheet for Patients:   StrictlyIdeas.no   Fact Sheet for Healthcare Providers:   BankingDealers.co.za    This test is not yet approved or cleared by the [place] FDA and  has been authorized for detection and/or diagnosis of SARS-CoV-2 by FDA under an Emergency Use Authorization (EUA).  This EUA will remain in effect (meanin g this test can be used) for the duration of  the COVID-19  declaration under Section 564(b)(1) of the Act, 21 U.S.C. section 360-bbb-3(b)(1), unless the authorization is terminated or revoked sooner.  Performed at [facility], [address] [place], [place] [address]   Blood Culture (routine x 2)     Status: None (Preliminary result)   Collection Time: 03/11/20  1:58 PM   Specimen: BLOOD  Result Value Ref Range Status   Specimen Description BLOOD LEFT ARM  Final   Special Requests   Final    BOTTLES DRAWN AEROBIC AND ANAEROBIC Blood Culture adequate volume   Culture   Final    NO GROWTH 4 DAYS Performed at [facility], [address] [place], [place] [address]    Report Status PENDING  Incomplete         Radiology Studies: DG Chest Port 1 View  Result Date: 03/15/2020 CLINICAL DATA:  70 year old female with shortness of breath. Positive COVID-19 4 days ago. MGUS. EXAM: PORTABLE CHEST 1 VIEW COMPARISON:  Portable chest 03/11/2020 and earlier. FINDINGS: Portable AP upright view at 0829 hours. Stable lung volumes. Normal cardiac size and mediastinal contours. Visualized tracheal air column is within normal limits. Patchy and indistinct mostly  peripheral bilateral pulmonary opacity persists, with asymmetric increased density at the left lung base. No pneumothorax or pleural effusion. Visualized tracheal air column is within normal limits. Stable visualized osseous structures. Prior shoulder arthroplasty. Paucity of bowel gas in the upper abdomen. IMPRESSION: Stable since 03/11/2020 patchy asymmetric pulmonary opacity compatible with COVID-19 pneumonia. Electronically Signed   By: Genevie Ann M.D.   On: 03/15/2020 08:44        Scheduled Meds: . apixaban  5 mg Oral BID  . azithromycin  500 mg Oral Daily  . DULoxetine  30 mg Oral BID  . fluticasone  1 spray Each Nare Daily  . insulin aspart  0-20 Units Subcutaneous Q4H  . insulin aspart  18 Units Subcutaneous TID WC  . insulin detemir  38 Units Subcutaneous BID  . levothyroxine  88 mcg Oral  QAC breakfast  . linagliptin  5 mg Oral Daily  . methylPREDNISolone (SOLU-MEDROL) injection  50 mg Intravenous Q12H  . metolazone  10 mg Oral QHS  . metoprolol tartrate  25 mg Oral BID WC  . nystatin   Topical BID  . pantoprazole  40 mg Oral BID AC  . pravastatin  80 mg Oral QHS  . pregabalin  75 mg Oral BID  . torsemide  40 mg Oral Daily  . traZODone  50 mg Oral QHS    LOS: 4 days    Time spent: 35 minutes    Maniya Donovan Darleen Crocker, DO [facility]  If 7PM-7AM, please contact night-coverage [email or web address] 03/15/2020, 11:55 AM

## 2020-03-15 NOTE — Progress Notes (Signed)
Inpatient Diabetes Program Recommendations  AACE/ADA: New Consensus Statement on Inpatient Glycemic Control (2015)  Target Ranges:  Prepandial:   less than 140 mg/dL      Peak postprandial:   less than 180 mg/dL (1-2 hours)      Critically ill patients:  140 - 180 mg/dL   Lab Results  Component Value Date   GLUCAP 177 (H) 03/15/2020   HGBA1C 9.2 (H) 03/11/2020  Results for Verdi, Stephanie [name] (MRN [id number]) as of 03/15/2020 10:55  Ref. Range 03/14/2020 17:23 03/14/2020 20:30 03/15/2020 00:07 03/15/2020 04:04  Glucose-Capillary Latest Ref Range: 70 - 99 mg/dL 256 (H) 283 (H) 300 (H) 177 (H)    Diabetes history: Type 2 DM Outpatient Diabetes medications: Xultophy 75 units QD, Humalog 30-47 units B/S; 30-32 units L Current orders for Inpatient glycemic control: Novolog 0-20 units TID, Levemir 30 units BID, Tradjenta 5 mg QD, Novolog 15 units TID Solumedrol 50 mg BID (restarted 1/28)  Inpatient Diabetes Program Recommendations:   Steroids increased this AM, thus anticipate insulin needs to further increase.  Consider further increasing: -Levemir to 38 unit BID -Increasing Novolog 18 units TID (asusming patient consuming >50% of meals).   Thanks, Bronson Curb, MSN, RNC-OB Diabetes Coordinator [phone] (8a-5p)

## 2020-03-15 NOTE — Care Management Important Message (Signed)
Important Message  Patient Details  Name: Stephanie [name] MRN: [id number] Date of Birth: [birth year]   Medicare Important Message Given:  Yes - Important Message mailed due to current National Emergency  Spoke with contact at [facility] 03/15/2020, 3:58 PM

## 2020-03-15 NOTE — Plan of Care (Signed)

## 2020-03-16 ENCOUNTER — Inpatient Hospital Stay (HOSPITAL_COMMUNITY): Payer: Medicare Other

## 2020-03-16 DIAGNOSIS — U071 COVID-19: Secondary | ICD-10-CM | POA: Diagnosis not present

## 2020-03-16 DIAGNOSIS — R0600 Dyspnea, unspecified: Secondary | ICD-10-CM | POA: Diagnosis not present

## 2020-03-16 DIAGNOSIS — J1282 Pneumonia due to coronavirus disease 2019: Secondary | ICD-10-CM | POA: Diagnosis not present

## 2020-03-16 LAB — GLUCOSE, CAPILLARY
Glucose-Capillary: 236 mg/dL — ABNORMAL HIGH (ref 70–99)
Glucose-Capillary: 235 mg/dL — ABNORMAL HIGH (ref 70–99)
Glucose-Capillary: 388 mg/dL — ABNORMAL HIGH (ref 70–99)
Glucose-Capillary: 394 mg/dL — ABNORMAL HIGH (ref 70–99)
Glucose-Capillary: 394 mg/dL — ABNORMAL HIGH (ref 70–99)

## 2020-03-16 LAB — COMPREHENSIVE METABOLIC PANEL
ALT: 68 U/L — ABNORMAL HIGH (ref 0–44)
AST: 51 U/L — ABNORMAL HIGH (ref 15–41)
Albumin: 2.3 g/dL — ABNORMAL LOW (ref 3.5–5.0)
Alkaline Phosphatase: 223 U/L — ABNORMAL HIGH (ref 38–126)
Anion gap: 8 (ref 5–15)
BUN: 50 mg/dL — ABNORMAL HIGH (ref 8–23)
CO2: 27 mmol/L (ref 22–32)
Calcium: 8.1 mg/dL — ABNORMAL LOW (ref 8.9–10.3)
Chloride: 99 mmol/L (ref 98–111)
Creatinine, Ser: 1.39 mg/dL — ABNORMAL HIGH (ref 0.44–1.00)
GFR, Estimated: 41 mL/min — ABNORMAL LOW (ref >60)
Glucose, Bld: 240 mg/dL — ABNORMAL HIGH (ref 70–99)
Potassium: 3.7 mmol/L (ref 3.5–5.1)
Sodium: 134 mmol/L — ABNORMAL LOW (ref 135–145)
Total Bilirubin: 0.6 mg/dL (ref 0.3–1.2)
Total Protein: 7.2 g/dL (ref 6.5–8.1)

## 2020-03-16 LAB — ECHOCARDIOGRAM LIMITED
Area-P 1/2: 3.63 cm2
Height: 68 in
S' Lateral: 2.76 cm
Weight: 3636.71 oz

## 2020-03-16 LAB — CULTURE, BLOOD (ROUTINE X 2)
Culture: NO GROWTH
Culture: NO GROWTH
Special Requests: ADEQUATE

## 2020-03-16 LAB — D-DIMER, QUANTITATIVE: D-Dimer, Quant: 1.35 ug/mL-FEU — ABNORMAL HIGH (ref 0.00–0.50)

## 2020-03-16 LAB — CBC WITH DIFFERENTIAL/PLATELET
Abs Immature Granulocytes: 0.08 10*3/uL — ABNORMAL HIGH (ref 0.00–0.07)
Basophils Absolute: 0 10*3/uL (ref 0.0–0.1)
Basophils Relative: 0 %
Eosinophils Absolute: 0 10*3/uL (ref 0.0–0.5)
Eosinophils Relative: 0 %
HCT: 43.6 % (ref 36.0–46.0)
Hemoglobin: 13.8 g/dL (ref 12.0–15.0)
Immature Granulocytes: 1 %
Lymphocytes Relative: 8 %
Lymphs Abs: 0.9 10*3/uL (ref 0.7–4.0)
MCH: 24.8 pg — ABNORMAL LOW (ref 26.0–34.0)
MCHC: 31.7 g/dL (ref 30.0–36.0)
MCV: 78.3 fL — ABNORMAL LOW (ref 80.0–100.0)
Monocytes Absolute: 0.4 10*3/uL (ref 0.1–1.0)
Monocytes Relative: 4 %
Neutro Abs: 10.4 10*3/uL — ABNORMAL HIGH (ref 1.7–7.7)
Neutrophils Relative %: 87 %
Platelets: 387 10*3/uL (ref 150–400)
RBC: 5.57 MIL/uL — ABNORMAL HIGH (ref 3.87–5.11)
RDW: 16.6 % — ABNORMAL HIGH (ref 11.5–15.5)
WBC: 11.8 10*3/uL — ABNORMAL HIGH (ref 4.0–10.5)
nRBC: 0 % (ref 0.0–0.2)

## 2020-03-16 LAB — MAGNESIUM: Magnesium: 1.9 mg/dL (ref 1.7–2.4)

## 2020-03-16 LAB — FERRITIN: Ferritin: 591 ng/mL — ABNORMAL HIGH (ref 11–307)

## 2020-03-16 LAB — PHOSPHORUS: Phosphorus: 4.4 mg/dL (ref 2.5–4.6)

## 2020-03-16 LAB — C-REACTIVE PROTEIN: CRP: 7.7 mg/dL — ABNORMAL HIGH (ref <1.0)

## 2020-03-16 NOTE — Progress Notes (Signed)
  Echocardiogram 2D Echocardiogram has been performed.  Stephanie [name] 03/16/2020, 10:42 AM

## 2020-03-16 NOTE — Progress Notes (Signed)
PROGRESS NOTE    Stephanie [name]  FXT:[id number] DOB: Jul 16, 1950 DOA: 03/11/2020 PCP: [name], FNP   Brief Narrative:  As per H&P written by Dr. Denton Brick on 03/11/2020 Stephanie Husbands Doeis a 70 y.o.femalewith medical history significant forCOPD, hypertension, hypothyroidism, diabetes mellitus, DVT. Presented to the ED with complaints of fevers, nasal congestion, vomiting, cough of about 6 days duration. Difficulty breathing started 2 days ago. She is vaccinated for [employer], butshe did not get the booster dose. She has some mild dull central chest pain from coughing. No lower extremity swelling. Patient is on Eliquis, but she reports she has not taking Eliquis in about 2 weeks because she was not feeling well.  ED Course:Impression 97. Heart rate tachycardic to 123, respiratory rate 18-28. Blood pressure systolic 299M to 426S. O2 sat 78% on room air, requiring 5 L of O2via nasal cannula-sats now 94 to 97%. D-dimer 1.99. Procalcitonin 0.79. Creatinine elevated 1.77. WBC 12.1. BNP 68.Lactic acid 2.5. Portable chest x-ray subtle bilateral heterogeneous and interstitial airspace opacity concerning for infection. Steroids and remdesivir initiated. Hospitalist to admit for Covid pneumonia.   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 -Chest x-ray stable and BNP is low, resume home diuretics today with torsemide and metolazone, started 1/28 -Plan to obtain 2D echocardiogram today due to exertional dyspnea  2-type 2 diabetes mellitus with hyperglycemia -As an outpatient patient usesxultophyand Humalog -Currently with hyperglycemia in the setting of steroids usage -Continue holding outpatient therapy -Started on Tradjenta daily; alsoPatient has been started on sliding scale insulin, NovoLog for meal coverage and twice a day Levemir -Follow CBGs and further adjust hypoglycemic regimen as required. -A1c9.2  3-Hypertension -Blood pressure soft on presentation, now elevated -Continue holding antihypertensive agents currently -Follow vital signs -Plan to resume home diuretics today  4-history ofCOPD (chronic obstructive pulmonary disease) (HCC) -No wheezing currently -Continue home inhaler management -Continue steroids and as needed bronchodilators as mentioned.  5-class I obesity -Low calorie diet, portion control increase physical activity discussed with patient. Body mass index is 34.56 kg/m.  6-history ofDeep vein thrombosis (DVT) of both lower extremities (HCC) -Resume home anticoagulation therapy using Eliquis. -No lower extremity swelling or pain currently.  7-hypothyroidism -Continue Synthroid  8-acute kidney injury on chronic kidney disease a stage IIIa-stable -Resumed torsemide and metolazone on 1/28 -Avoid hypotension -Continue to maintain adequate hydration -Follow renal function trend. -Creatinine currently 1.39;baseline 1.1-1.2   DVT prophylaxis:Chronically on apixaban. Code Status:Full code Family Communication:Discussed with daughter on phone 1/29 Disposition:  Status is: Inpatient  Dispo: The patient is  from:Home Anticipated d/c is TM:HDQQ Anticipated d/c date is: 1-2 days;if otherwise stable with most likely oxygen supplementation and completion of remdesivir infusion after discharge. Patient currently no medically stable for discharge; still short winded, requiring 5 L nasal cannula supplementation and experiencing intermittent coughing spells. Requires 2D echocardiogram for further  evaluation.   Consultants:  None   Procedures: See below for x-ray reports   Antimicrobials/antiviral Remdesivir5/5-completed 1/28   Subjective: Patient seen and evaluated today with no new acute complaints or concerns. No acute concerns or events noted overnight.  She states that her shortness of breath is improving.  Objective: Vitals:   03/15/20 0418 03/15/20 1721 03/15/20 2017 03/16/20 0806  BP: 133/63 116/74 138/72 (!) 156/81  Pulse: 75 79 73 70  Resp: 18 16 20    Temp: (!) 97.5 F (36.4 C) 98 F (36.7 C) 97.8 F (36.6 C)   TempSrc: Oral Oral Oral   SpO2: 100% 95% 99% 97%  Weight:      Height:        Intake/Output Summary (Last 24 hours) at 03/16/2020 0957 Last data filed at 03/16/2020 0900 Gross per 24 hour  Intake 700 ml  Output --  Net 700 ml   Filed Weights   03/11/20 1933  Weight: 103.1 kg    Examination:  General exam: Appears calm and comfortable, obese Respiratory system: Clear to auscultation. Respiratory effort normal.  Currently on 4 L nasal cannula oxygen. Cardiovascular system: S1 & S2 heard, RRR.  Gastrointestinal system: Abdomen is soft Central nervous system: Alert and awake Extremities: No edema Skin: No significant lesions noted Psychiatry: Flat affect.    Data Reviewed: I have personally reviewed following labs and imaging studies  CBC: Recent Labs  Lab 03/12/20 0657 03/13/20 0521 03/14/20 0636 03/15/20 0400 03/16/20 0716  WBC 6.4 12.7* 13.4* 11.8* 11.8*  NEUTROABS 5.5 11.4* 11.6* 9.7* 10.4*   HGB 12.8 13.1 12.3 12.2 13.8  HCT 41.4 42.3 39.5 39.3 43.6  MCV 79.9* 79.2* 78.8* 79.6* 78.3*  PLT 345 425* 410* 400 030   Basic Metabolic Panel: Recent Labs  Lab 03/12/20 0657 03/13/20 0521 03/14/20 0636 03/15/20 0400 03/16/20 0716  NA 133* 137 135 135 134*  K 4.6 4.0 3.6 3.5 3.7  CL 98 101 106 103 99  CO2 24 25 23 23 27   GLUCOSE 429* 369* 152* 183* 240*  BUN 51* 48* 44* 45* 50*  CREATININE 1.35* 1.25* 1.06* 1.18* 1.39*  CALCIUM 8.4* 8.4* 8.1* 8.1* 8.1*  MG 2.5* 2.4 2.2 2.1 1.9  PHOS 3.0 3.0 3.4 3.4 4.4   GFR: Estimated Creatinine Clearance: 48 mL/min (A) (by C-G formula based on SCr of 1.39 mg/dL (H)). Liver Function Tests: Recent Labs  Lab 03/12/20 0657 03/13/20 0521 03/14/20 0636 03/15/20 0400 03/16/20 0716  AST 26 22 35 59* 51*  ALT 34 32 33 52* 68*  ALKPHOS 227* 203* 168* 177* 223*  BILITOT 0.6 0.4 0.6 0.4 0.6  PROT 8.3* 7.5 6.8 6.5 7.2  ALBUMIN 2.3* 2.1* 2.0* 2.0* 2.3*   No results for input(s): LIPASE, AMYLASE in the last 168 hours. No results for input(s): AMMONIA in the last 168 hours. Coagulation Profile: No results for input(s): INR, PROTIME in the last 168 hours. Cardiac Enzymes: No results for input(s): CKTOTAL, CKMB, CKMBINDEX, TROPONINI in the last 168 hours. BNP (last 3 results) No results for input(s): PROBNP in the last 8760 hours. HbA1C: No results for input(s): HGBA1C in the last 72 hours. CBG: Recent Labs  Lab 03/15/20 0404 03/15/20 1658 03/15/20 2013 03/16/20 0134 03/16/20 0755  GLUCAP 177* 276* 240* 236* 235*   Lipid Profile: No results for input(s): CHOL, HDL, LDLCALC, TRIG, CHOLHDL, LDLDIRECT in the last 72 hours. Thyroid Function Tests: No results for input(s): TSH, T4TOTAL, FREET4, T3FREE, THYROIDAB in the last 72 hours. Anemia  Panel: Recent Labs    03/14/20 0636 03/15/20 0400  FERRITIN 620* 568*   Sepsis Labs: Recent Labs  Lab 03/11/20 1353 03/11/20 1435 03/11/20 1819  PROCALCITON 0.79  --   --    LATICACIDVEN  --  2.5* 2.0*    Recent Results (from the past 240 hour(s))  Blood Culture (routine x 2)     Status: None   Collection Time: 03/11/20  1:45 PM   Specimen: BLOOD  Result Value Ref Range Status   Specimen Description BLOOD  Final   Special Requests NONE  Final   Culture   Final    NO GROWTH 5 DAYS Performed at [facility], [address], [place], [place] [address]    Report Status 03/16/2020 FINAL  Final  SARS Coronavirus 2 by RT PCR (hospital order, performed in [facility] hospital lab) Nasopharyngeal Nasopharyngeal Swab     Status: Abnormal   Collection Time: 03/11/20  1:47 PM   Specimen: Nasopharyngeal Swab  Result Value Ref Range Status   SARS Coronavirus 2 POSITIVE (A) NEGATIVE Final    Comment: RESULT CALLED TO, READ BACK BY AND VERIFIED WITH: MYRICK,B AT 1515 ON 03/11/20 BY HUFFINES,S. (NOTE) SARS-CoV-2 target nucleic acids are DETECTED  SARS-CoV-2 RNA is generally detectable in upper respiratory specimens  during the acute phase of infection.  Positive results are indicative  of the presence of the identified virus, but do not rule out bacterial infection or co-infection with other pathogens not detected by the test.  Clinical correlation with patient history and  other diagnostic information is necessary to determine patient infection status.  The expected result is negative.  Fact Sheet for Patients:   StrictlyIdeas.no   Fact Sheet for Healthcare Providers:   BankingDealers.co.za    This test is not yet approved or cleared by the [place] FDA and  has been authorized for detection and/or diagnosis of SARS-CoV-2 by FDA under an Emergency Use Authorization (EUA).  This EUA will remain in effect (meanin g this test can be used) for the duration of  the COVID-19 declaration under Section 564(b)(1) of the Act, 21 U.S.C. section 360-bbb-3(b)(1), unless the authorization is terminated or revoked  sooner.  Performed at [facility], [address] [place], [place] [address]   Blood Culture (routine x 2)     Status: None   Collection Time: 03/11/20  1:58 PM   Specimen: BLOOD  Result Value Ref Range Status   Specimen Description BLOOD LEFT ARM  Final   Special Requests   Final    BOTTLES DRAWN AEROBIC AND ANAEROBIC Blood Culture adequate volume   Culture   Final    NO GROWTH 5 DAYS Performed at [facility], [address] [place], [place] [address]    Report Status 03/16/2020 FINAL  Final         Radiology Studies: [facility] Chest Port 1 View  Result Date: 03/15/2020 CLINICAL DATA:  70 year old female with shortness of breath. Positive COVID-19 4 days ago. MGUS. EXAM: PORTABLE CHEST 1 VIEW COMPARISON:  Portable chest 03/11/2020 and earlier. FINDINGS: Portable AP upright view at 0829 hours. Stable lung volumes. Normal cardiac size and mediastinal contours. Visualized tracheal air column is within normal limits. Patchy and indistinct mostly peripheral bilateral pulmonary opacity persists, with asymmetric increased density at the left lung base. No pneumothorax or pleural effusion. Visualized tracheal air column is within normal limits. Stable visualized osseous structures. Prior shoulder arthroplasty. Paucity of bowel gas in the upper abdomen. IMPRESSION: Stable since  03/11/2020 patchy asymmetric pulmonary opacity compatible with COVID-19 pneumonia. Electronically Signed   By: Genevie Ann M.D.   On: 03/15/2020 08:44        Scheduled Meds: . apixaban  5 mg Oral BID  . DULoxetine  30 mg Oral BID  . fluticasone  1 spray Each Nare Daily  . insulin aspart  0-20 Units Subcutaneous Q4H  . insulin aspart  18 Units Subcutaneous TID WC  . insulin detemir  38 Units Subcutaneous BID  . levothyroxine  88 mcg Oral QAC breakfast  . linagliptin  5 mg Oral Daily  . methylPREDNISolone (SOLU-MEDROL) injection  50 mg Intravenous Q12H  . metolazone  10 mg Oral QHS  . metoprolol tartrate  25 mg Oral  BID WC  . nystatin   Topical BID  . pantoprazole  40 mg Oral BID AC  . pravastatin  80 mg Oral QHS  . pregabalin  75 mg Oral BID  . torsemide  40 mg Oral Daily  . traZODone  50 mg Oral QHS    LOS: 5 days    Time spent: 35 minutes    Madisynn Plair Darleen Crocker, DO [facility]  If 7PM-7AM, please contact night-coverage [email or web address] 03/16/2020, 9:57 AM

## 2020-03-16 NOTE — Plan of Care (Signed)

## 2020-03-17 ENCOUNTER — Other Ambulatory Visit: Payer: Self-pay

## 2020-03-17 ENCOUNTER — Emergency Department (HOSPITAL_COMMUNITY): Payer: Medicare Other

## 2020-03-17 ENCOUNTER — Inpatient Hospital Stay (HOSPITAL_COMMUNITY)
Admission: EM | Admit: 2020-03-17 | Discharge: 2020-03-22 | Disposition: A | Discharge status: Home / Self Care | Payer: Medicare Other | Source: Home / Self Care | Attending: Internal Medicine | Admitting: Internal Medicine

## 2020-03-17 ENCOUNTER — Encounter (HOSPITAL_COMMUNITY): Payer: Self-pay | Admitting: Emergency Medicine

## 2020-03-17 DIAGNOSIS — E871 Hypo-osmolality and hyponatremia: Secondary | ICD-10-CM | POA: Diagnosis present

## 2020-03-17 DIAGNOSIS — J449 Chronic obstructive pulmonary disease, unspecified: Secondary | ICD-10-CM | POA: Diagnosis present

## 2020-03-17 DIAGNOSIS — J441 Chronic obstructive pulmonary disease with (acute) exacerbation: Secondary | ICD-10-CM | POA: Diagnosis present

## 2020-03-17 DIAGNOSIS — I130 Hypertensive heart and chronic kidney disease with heart failure and stage 1 through stage 4 chronic kidney disease, or unspecified chronic kidney disease: Secondary | ICD-10-CM | POA: Diagnosis present

## 2020-03-17 DIAGNOSIS — U071 COVID-19: Secondary | ICD-10-CM | POA: Diagnosis present

## 2020-03-17 DIAGNOSIS — I5032 Chronic diastolic (congestive) heart failure: Secondary | ICD-10-CM | POA: Diagnosis present

## 2020-03-17 DIAGNOSIS — E039 Hypothyroidism, unspecified: Secondary | ICD-10-CM | POA: Diagnosis present

## 2020-03-17 DIAGNOSIS — D472 Monoclonal gammopathy: Secondary | ICD-10-CM | POA: Diagnosis present

## 2020-03-17 DIAGNOSIS — E876 Hypokalemia: Secondary | ICD-10-CM | POA: Diagnosis present

## 2020-03-17 DIAGNOSIS — Z86718 Personal history of other venous thrombosis and embolism: Secondary | ICD-10-CM

## 2020-03-17 DIAGNOSIS — M797 Fibromyalgia: Secondary | ICD-10-CM | POA: Diagnosis present

## 2020-03-17 DIAGNOSIS — Z87891 Personal history of nicotine dependence: Secondary | ICD-10-CM

## 2020-03-17 DIAGNOSIS — N179 Acute kidney failure, unspecified: Secondary | ICD-10-CM | POA: Diagnosis present

## 2020-03-17 DIAGNOSIS — N1831 Chronic kidney disease, stage 3a: Secondary | ICD-10-CM | POA: Diagnosis present

## 2020-03-17 DIAGNOSIS — J9621 Acute and chronic respiratory failure with hypoxia: Secondary | ICD-10-CM | POA: Diagnosis present

## 2020-03-17 DIAGNOSIS — Z7901 Long term (current) use of anticoagulants: Secondary | ICD-10-CM

## 2020-03-17 DIAGNOSIS — E1122 Type 2 diabetes mellitus with diabetic chronic kidney disease: Secondary | ICD-10-CM | POA: Diagnosis present

## 2020-03-17 DIAGNOSIS — J440 Chronic obstructive pulmonary disease with acute lower respiratory infection: Secondary | ICD-10-CM | POA: Diagnosis present

## 2020-03-17 DIAGNOSIS — Z6834 Body mass index (BMI) 34.0-34.9, adult: Secondary | ICD-10-CM

## 2020-03-17 DIAGNOSIS — Z9049 Acquired absence of other specified parts of digestive tract: Secondary | ICD-10-CM

## 2020-03-17 DIAGNOSIS — I10 Essential (primary) hypertension: Secondary | ICD-10-CM | POA: Diagnosis present

## 2020-03-17 DIAGNOSIS — E669 Obesity, unspecified: Secondary | ICD-10-CM | POA: Diagnosis present

## 2020-03-17 DIAGNOSIS — E1165 Type 2 diabetes mellitus with hyperglycemia: Secondary | ICD-10-CM | POA: Diagnosis present

## 2020-03-17 DIAGNOSIS — J189 Pneumonia, unspecified organism: Secondary | ICD-10-CM

## 2020-03-17 DIAGNOSIS — E7800 Pure hypercholesterolemia, unspecified: Secondary | ICD-10-CM | POA: Diagnosis present

## 2020-03-17 DIAGNOSIS — I5033 Acute on chronic diastolic (congestive) heart failure: Secondary | ICD-10-CM

## 2020-03-17 DIAGNOSIS — T380X5A Adverse effect of glucocorticoids and synthetic analogues, initial encounter: Secondary | ICD-10-CM | POA: Diagnosis present

## 2020-03-17 DIAGNOSIS — J1282 Pneumonia due to coronavirus disease 2019: Secondary | ICD-10-CM | POA: Diagnosis present

## 2020-03-17 DIAGNOSIS — Z90711 Acquired absence of uterus with remaining cervical stump: Secondary | ICD-10-CM

## 2020-03-17 DIAGNOSIS — Z96611 Presence of right artificial shoulder joint: Secondary | ICD-10-CM | POA: Diagnosis present

## 2020-03-17 DIAGNOSIS — E785 Hyperlipidemia, unspecified: Secondary | ICD-10-CM | POA: Diagnosis present

## 2020-03-17 DIAGNOSIS — A419 Sepsis, unspecified organism: Secondary | ICD-10-CM

## 2020-03-17 DIAGNOSIS — J069 Acute upper respiratory infection, unspecified: Secondary | ICD-10-CM | POA: Diagnosis present

## 2020-03-17 LAB — COMPREHENSIVE METABOLIC PANEL
ALT: 64 U/L — ABNORMAL HIGH (ref 0–44)
ALT: 77 U/L — ABNORMAL HIGH (ref 0–44)
AST: 43 U/L — ABNORMAL HIGH (ref 15–41)
AST: 49 U/L — ABNORMAL HIGH (ref 15–41)
Albumin: 2.2 g/dL — ABNORMAL LOW (ref 3.5–5.0)
Albumin: 2.7 g/dL — ABNORMAL LOW (ref 3.5–5.0)
Alkaline Phosphatase: 212 U/L — ABNORMAL HIGH (ref 38–126)
Alkaline Phosphatase: 263 U/L — ABNORMAL HIGH (ref 38–126)
Anion gap: 10 (ref 5–15)
Anion gap: 12 (ref 5–15)
BUN: 58 mg/dL — ABNORMAL HIGH (ref 8–23)
BUN: 66 mg/dL — ABNORMAL HIGH (ref 8–23)
CO2: 26 mmol/L (ref 22–32)
CO2: 24 mmol/L (ref 22–32)
Calcium: 8.3 mg/dL — ABNORMAL LOW (ref 8.9–10.3)
Calcium: 8.4 mg/dL — ABNORMAL LOW (ref 8.9–10.3)
Chloride: 95 mmol/L — ABNORMAL LOW (ref 98–111)
Chloride: 93 mmol/L — ABNORMAL LOW (ref 98–111)
Creatinine, Ser: 1.43 mg/dL — ABNORMAL HIGH (ref 0.44–1.00)
Creatinine, Ser: 1.78 mg/dL — ABNORMAL HIGH (ref 0.44–1.00)
GFR, Estimated: 40 mL/min — ABNORMAL LOW (ref >60)
GFR, Estimated: 31 mL/min — ABNORMAL LOW (ref >60)
Glucose, Bld: 189 mg/dL — ABNORMAL HIGH (ref 70–99)
Glucose, Bld: 280 mg/dL — ABNORMAL HIGH (ref 70–99)
Potassium: 3.6 mmol/L (ref 3.5–5.1)
Potassium: 3.3 mmol/L — ABNORMAL LOW (ref 3.5–5.1)
Sodium: 131 mmol/L — ABNORMAL LOW (ref 135–145)
Sodium: 129 mmol/L — ABNORMAL LOW (ref 135–145)
Total Bilirubin: 0.5 mg/dL (ref 0.3–1.2)
Total Bilirubin: 0.6 mg/dL (ref 0.3–1.2)
Total Protein: 6.8 g/dL (ref 6.5–8.1)
Total Protein: 7.8 g/dL (ref 6.5–8.1)

## 2020-03-17 LAB — CBC WITH DIFFERENTIAL/PLATELET
Abs Immature Granulocytes: 0.16 10*3/uL — ABNORMAL HIGH (ref 0.00–0.07)
Basophils Absolute: 0 10*3/uL (ref 0.0–0.1)
Basophils Relative: 0 %
Eosinophils Absolute: 0 10*3/uL (ref 0.0–0.5)
Eosinophils Relative: 0 %
HCT: 44.2 % (ref 36.0–46.0)
Hemoglobin: 14.2 g/dL (ref 12.0–15.0)
Immature Granulocytes: 1 %
Lymphocytes Relative: 4 %
Lymphs Abs: 0.7 10*3/uL (ref 0.7–4.0)
MCH: 24.6 pg — ABNORMAL LOW (ref 26.0–34.0)
MCHC: 32.1 g/dL (ref 30.0–36.0)
MCV: 76.6 fL — ABNORMAL LOW (ref 80.0–100.0)
Monocytes Absolute: 0.9 10*3/uL (ref 0.1–1.0)
Monocytes Relative: 5 %
Neutro Abs: 16.6 10*3/uL — ABNORMAL HIGH (ref 1.7–7.7)
Neutrophils Relative %: 90 %
Platelets: 443 10*3/uL — ABNORMAL HIGH (ref 150–400)
RBC: 5.77 MIL/uL — ABNORMAL HIGH (ref 3.87–5.11)
RDW: 17.3 % — ABNORMAL HIGH (ref 11.5–15.5)
WBC: 18.4 10*3/uL — ABNORMAL HIGH (ref 4.0–10.5)
nRBC: 0 % (ref 0.0–0.2)

## 2020-03-17 LAB — CBC
HCT: 43 % (ref 36.0–46.0)
Hemoglobin: 13.8 g/dL (ref 12.0–15.0)
MCH: 24.7 pg — ABNORMAL LOW (ref 26.0–34.0)
MCHC: 32.1 g/dL (ref 30.0–36.0)
MCV: 77.1 fL — ABNORMAL LOW (ref 80.0–100.0)
Platelets: 370 10*3/uL (ref 150–400)
RBC: 5.58 MIL/uL — ABNORMAL HIGH (ref 3.87–5.11)
RDW: 16.6 % — ABNORMAL HIGH (ref 11.5–15.5)
WBC: 15.2 10*3/uL — ABNORMAL HIGH (ref 4.0–10.5)
nRBC: 0 % (ref 0.0–0.2)

## 2020-03-17 LAB — GLUCOSE, CAPILLARY
Glucose-Capillary: 188 mg/dL — ABNORMAL HIGH (ref 70–99)
Glucose-Capillary: 184 mg/dL — ABNORMAL HIGH (ref 70–99)
Glucose-Capillary: 312 mg/dL — ABNORMAL HIGH (ref 70–99)

## 2020-03-17 LAB — CBG MONITORING, ED: Glucose-Capillary: 299 mg/dL — ABNORMAL HIGH (ref 70–99)

## 2020-03-17 LAB — TROPONIN I (HIGH SENSITIVITY): Troponin I (High Sensitivity): 82 ng/L — ABNORMAL HIGH (ref <18)

## 2020-03-17 LAB — LACTIC ACID, PLASMA: Lactic Acid, Venous: 3.7 mmol/L (ref 0.5–1.9)

## 2020-03-17 LAB — MAGNESIUM: Magnesium: 1.8 mg/dL (ref 1.7–2.4)

## 2020-03-17 MED ORDER — ASPIRIN 81 MG PO CHEW
324.0000 mg | CHEWABLE_TABLET | Freq: Once | ORAL | Status: AC
  Administered 2020-03-17: 324 mg via ORAL
  Filled 2020-03-17: qty 4

## 2020-03-17 MED ORDER — PREDNISONE 10 MG PO TABS: ORAL_TABLET | ORAL | 0 refills | Status: DC

## 2020-03-17 MED ORDER — DEXAMETHASONE SODIUM PHOSPHATE 10 MG/ML IJ SOLN
6.0000 mg | INTRAMUSCULAR | Status: DC
  Administered 2020-03-17: 6 mg via INTRAVENOUS
  Filled 2020-03-17: qty 1

## 2020-03-17 MED ORDER — SODIUM CHLORIDE 0.9% FLUSH
3.0000 mL | Freq: Two times a day (BID) | INTRAVENOUS | Status: DC
  Administered 2020-03-17 – 2020-03-22 (×9): 3 mL via INTRAVENOUS

## 2020-03-17 MED ORDER — SODIUM CHLORIDE 0.9 % IV SOLN
250.0000 mL | INTRAVENOUS | Status: DC | PRN
Start: 2020-03-17 — End: 2020-03-22

## 2020-03-17 MED ORDER — GUAIFENESIN 100 MG/5ML PO SOLN
5.0000 mL | ORAL | Status: DC | PRN
  Administered 2020-03-17: 100 mg via ORAL
  Filled 2020-03-17: qty 5

## 2020-03-17 MED ORDER — LEVOFLOXACIN IN D5W 750 MG/150ML IV SOLN
750.0000 mg | Freq: Once | INTRAVENOUS | Status: AC
  Administered 2020-03-17: 750 mg via INTRAVENOUS
  Filled 2020-03-17: qty 150

## 2020-03-17 MED ORDER — IPRATROPIUM-ALBUTEROL 20-100 MCG/ACT IN AERS
1.0000 | INHALATION_SPRAY | Freq: Four times a day (QID) | RESPIRATORY_TRACT | Status: DC
  Administered 2020-03-18 (×3): 1 via RESPIRATORY_TRACT
  Filled 2020-03-17 (×2): qty 4

## 2020-03-17 MED ORDER — AEROCHAMBER PLUS FLO-VU MEDIUM MISC
1.0000 | Freq: Once | Status: AC
  Administered 2020-03-17: 1
  Filled 2020-03-17 (×2): qty 1

## 2020-03-17 MED ORDER — SODIUM CHLORIDE 0.9% FLUSH: 3.0000 mL | INTRAVENOUS | Status: DC | PRN

## 2020-03-17 MED ORDER — GUAIFENESIN 100 MG/5ML PO SOLN: 5.0000 mL | ORAL | 0 refills | Status: DC | PRN

## 2020-03-17 MED ORDER — ALBUTEROL SULFATE HFA 108 (90 BASE) MCG/ACT IN AERS
6.0000 | INHALATION_SPRAY | Freq: Once | RESPIRATORY_TRACT | Status: AC
  Administered 2020-03-17: 6 via RESPIRATORY_TRACT
  Filled 2020-03-17: qty 6.7

## 2020-03-17 NOTE — TOC Transition Note (Signed)
Transition of Care [facility]) - CM/SW Discharge Note   Patient Details  Name: Stephanie [name] MRN: [id number] Date of Birth: [birth year]  Transition of Care [facility]) CM/SW Contact:  Shade Flood, LCSW Phone Number: 03/17/2020, 2:16 PM   Clinical Narrative:     Pt stable for dc per MD. Pt requires Home O2 for dc. Arranged with [employer]. Rep from [employer] has spoken with family to arrange in home setup including portable tank for family to bring with when they pick up pt.   There are no other TOC needs for dc.  Final next level of care: Home/Self Care Barriers to Discharge: Barriers Resolved   Patient Goals and CMS Choice Patient states their goals for this hospitalization and ongoing recovery are:: go home CMS Medicare.gov Compare Post Acute Care list provided to:: Patient Choice offered to / list presented to : Patient  Discharge Placement                       Discharge Plan and Services                DME Arranged: Oxygen DME Agency: [employer] Date DME Agency Contacted: 03/17/20   Representative spoke with at DME Agency: Caryl Pina            Social Determinants of Health ([place]) Interventions     Readmission Risk Interventions No flowsheet data found.

## 2020-03-17 NOTE — ED Notes (Signed)
Spoke with Dr. Sabra Heck regarding Blood Cultures being drawn at the same time and concerns of lab tech Taylor. Dr. Sabra Heck requests that Blood Cultures that were drawn previously be used, lab notified.

## 2020-03-17 NOTE — ED Provider Notes (Signed)
[name] Provider Note   CSN: [id number] Arrival date & time: 03/17/20  1850     History Chief Complaint  Patient presents with  . Shortness of Breath    Stephanie [name] is a 70 y.o. female.  HPI    This patient is a 70 year old female, unfortunately she has a history of COPD, she has had a history of blood clots on the Eliquis, she has diabetes, hypertension and reports a history of congestive heart failure as well.  She presents to the hospital with shortness of breath which seems to have increased since she was discharged from the hospital.  She was discharged on oxygen to be used at 4 L and to turn it up as needed.  When she got home today she tried to use the bedside commode but moving back-and-forth she became very short of breath.  She does report that she was having dyspnea on exertion while in the hospital, this did seem to be a little bit worse.  She reports intermittent coughing but does have an increased heaviness on her chest since going home.  She has no swelling of the legs, no fevers or chills today, the chest discomfort is concerning to her as she did not have it this bad while in the hospital though she was having chest pain while she was in the hospital.  Review of the medical record shows that she had grade 1 diastolic dysfunction with an ejection fraction of 55 to 60% as obtained yesterday on an echocardiogram.  She was discharged on a steroid taper Remdesevir given in hospital.  Past Medical History:  Diagnosis Date  . Anemia   . Arthritis   . Asthma   . COPD (chronic obstructive pulmonary disease) ([place])   . Deep vein thrombosis (DVT) of both lower extremities ([place]) 06/27/2015  . Diabetes mellitus   . Fibromyalgia   . GERD (gastroesophageal reflux disease)   . H/O hiatal hernia   . Hypercholesteremia   . Hypertension   . Hyperthyroidism   . IBS (irritable bowel syndrome)   . Inner ear disease   . MGUS (monoclonal gammopathy of unknown  significance) 12/13/2015  . PONV (postoperative nausea and vomiting)   . Tachycardia     Patient Active Problem List   Diagnosis Date Noted  . Pneumonia due to COVID-19 virus 03/11/2020  . Acute respiratory disease due to COVID-19 virus 03/11/2020  . Abnormal LFTs 02/28/2018  . TIA (transient ischemic attack) 09/15/2017  . Localized osteoarthritis of right shoulder 08/08/2017  . Dysphagia 12/16/2015  . 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 Constipation 10/27/2015  . Diarrhea 08/02/2015  . Elevated alkaline phosphatase level 07/23/2015  . Heme positive stool 07/23/2015  . Hypoglycemia 07/01/2015  . Deep vein thrombosis (DVT) of both lower extremities ([place]) 06/27/2015  . 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 Maculopapular rash, generalized 06/27/2015  . Alopecia 06/27/2015  . Hypothyroidism 06/27/2015  . Diverticulitis 10/19/2013  . Tobacco abuse 10/19/2013  . Obesity 10/19/2013  . Diabetes ([place]) 10/19/2013  . Hypertension   . Anemia   . GERD (gastroesophageal reflux disease)   . COPD (chronic obstructive pulmonary disease) ([place])     Past Surgical History:  Procedure Laterality Date  . ABDOMINAL HYSTERECTOMY  partial  . CARPAL TUNNEL RELEASE Right 1991  . CATARACT EXTRACTION W/PHACO Right 05/08/2013   Procedure: CATARACT EXTRACTION PHACO AND INTRAOCULAR LENS PLACEMENT (IOC);  Surgeon: Tonny Branch, MD;  Location: [facility];  Service: Ophthalmology;  Laterality: Right;  CDE 10.31  . CATARACT EXTRACTION W/PHACO Left 08/17/2013   Procedure: CATARACT EXTRACTION PHACO AND INTRAOCULAR LENS PLACEMENT (IOC);  Surgeon: Tonny Branch, MD;  Location: [facility];  Service: Ophthalmology;  Laterality: Left;  CDE:9.03  . CHOLECYSTECTOMY  1971  . COLONOSCOPY WITH PROPOFOL N/A 01/06/2016   Dr. Gala Romney: diverticulosis   . DENTAL SURGERY    . ESOPHAGEAL BRUSHING  08/29/2019   Procedure: ESOPHAGEAL BRUSHING;  Surgeon: Daneil Dolin, MD;  Location: [facility];  Service:  Endoscopy;;  . ESOPHAGOGASTRODUODENOSCOPY (EGD) WITH PROPOFOL N/A 01/06/2016   Dr. Gala Romney: normal s/p empiric dilation   . ESOPHAGOGASTRODUODENOSCOPY (EGD) WITH PROPOFOL N/A 08/29/2019   esophageal plaques vs medication residue adherent to tubular esophagus s/p KOH brushing and dilation. Medium-sized hiatal hernia. + for candida. Diflucan.   Marland Kitchen EYE SURGERY    . MALONEY DILATION N/A 01/06/2016   Procedure: Venia Minks DILATION;  Surgeon: Daneil Dolin, MD;  Location: [facility];  Service: Endoscopy;  Laterality: N/A;  . Venia Minks DILATION N/A 08/29/2019   Procedure: Venia Minks DILATION;  Surgeon: Daneil Dolin, MD;  Location: [facility];  Service: Endoscopy;  Laterality: N/A;  . REVERSE SHOULDER ARTHROPLASTY Right 08/06/2017   Procedure: RIGHT REVERSE SHOULDER ARTHROPLASTY;  Surgeon: Netta Cedars, MD;  Location: [facility];  Service: Orthopedics;  Laterality: Right;  . WRIST GANGLION EXCISION Left      OB History   No obstetric history on file.     Family History  Problem Relation Age of Onset  . Colon cancer Neg Hx     Social History   Tobacco Use  . Smoking status: Former Smoker    Packs/day: 0.25    Years: 30.00    Pack years: 7.50    Types: Cigarettes    Quit date: 02/17/2014    Years since quitting: 6.0  . Smokeless tobacco: Never Used  . Tobacco comment: some day smoker  Substance Use Topics  . Alcohol use: Yes    Alcohol/week: 0.0 standard drinks    Comment: occas  . Drug use: No    Home Medications Prior to Admission medications   Medication Sig Start Date End Date Taking? Authorizing Provider  acetaminophen (TYLENOL) 500 MG tablet Take 1,000 mg by mouth every 6 (six) hours as needed for moderate pain or headache.     [provider]  albuterol (PROVENTIL HFA;VENTOLIN HFA) 108 (90 BASE) MCG/ACT inhaler Inhale 2 puffs into the lungs every 6 (six) hours as needed for wheezing or shortness of breath.     [provider]  apixaban (ELIQUIS) 5 MG TABS tablet  Take 10 mg by mouth BID through Friday 5/19 evening. Take 75m by mouth BID starting 5/20 AM. Patient taking differently: Take 5 mg by mouth 2 (two) times daily. 07/25/15   LLendon Colonel NP  ARNUITY ELLIPTA 100 MCG/ACT AEPB Inhale 1 puff into the lungs at bedtime.  06/13/17   [provider]  Ascorbic Acid (VITAMIN C) 1000 MG tablet Take 1,000 mg by mouth daily.     [provider]  cetirizine (ZYRTEC) 10 MG tablet Take 10 mg by mouth daily.    [provider]  Cholecalciferol (VITAMIN D) 2000 units tablet Take 4,000 Units by mouth daily.     [provider]  cimetidine (TAGAMET) 200 MG tablet Take 0.5 tablets (100 mg total) by mouth at bedtime. 02/28/18   LMahala Menghini PA-C  CINNAMON PO Take 2-3 capsules by mouth 2 (two) times  daily as needed (high blood sugar).     [provider]  clobetasol ointment (TEMOVATE) 8.10 % Apply 1 application topically 3 (three) times daily as needed (imflammation).  06/20/15   [provider]  Cod Liver Oil CAPS Take 1 capsule by mouth at bedtime.     [provider]  diclofenac sodium (VOLTAREN) 1 % GEL Apply 2 g topically 2 (two) times daily. 09/16/17   Kayleen Memos, DO  diphenhydrAMINE (BENADRYL) 25 MG tablet Take 25 mg by mouth every 6 (six) hours as needed for itching.    [provider]  Doxepin HCl 5 % CREA Apply 2 g topically 3 (three) times daily as needed (itching).  05/28/16   [provider]  DULoxetine (CYMBALTA) 30 MG capsule Take 1 capsule (30 mg total) by mouth 2 (two) times daily. 09/16/17   Kayleen Memos, DO  fluticasone (VERAMYST) 27.5 MCG/SPRAY nasal spray Place 2 sprays into the nose daily.     [provider]  guaiFENesin (ROBITUSSIN) 100 MG/5ML SOLN Take 5 mLs (100 mg total) by mouth every 4 (four) hours as needed for cough or to loosen phlegm. 03/17/20   Manuella Ghazi, Pratik D, DO  HUMALOG KWIKPEN 100 UNIT/ML KiwkPen Inject 30-47 Units into the skin See admin  instructions. If blood sugar is  90-150= 45 units, 150-200= 46 units. 200-250= 47 units at breakfast and supper Lunchtime: If BS is  90-150=30 units,150- 200=31 units,200-250 units 32 09/30/17   [provider]  hydrOXYzine (ATARAX/VISTARIL) 25 MG tablet Take 25 mg by mouth every 8 (eight) hours as needed for itching.  06/20/15   [provider]  levothyroxine (SYNTHROID) 88 MCG tablet Take 88 mcg by mouth daily before breakfast.    [provider]  lubiprostone (AMITIZA) 8 MCG capsule Take 1 capsule (8 mcg total) by mouth 2 (two) times daily as needed for constipation. 01/02/20   Annitta Needs, NP  LYRICA 75 MG capsule Take 75 mg by mouth 2 (two) times daily.  07/18/15   [provider]  methocarbamol (ROBAXIN) 500 MG tablet Take 1 tablet (500 mg total) by mouth 3 (three) times daily as needed. Patient taking differently: Take 500 mg by mouth 3 (three) times daily as needed for muscle spasms. 08/06/17   Netta Cedars, MD  metolazone (ZAROXOLYN) 5 MG tablet Take 1 tablet (5 mg total) by mouth 2 (two) times daily. Patient taking differently: Take 10 mg by mouth at bedtime. 07/02/15   Samuella Cota, MD  metoprolol tartrate (LOPRESSOR) 25 MG tablet Take 1 tablet (25 mg total) by mouth 2 (two) times daily. Patient taking differently: Take 25-37.5 mg by mouth See admin instructions. Take 37.5 mg in the morning and 25 mg at night 09/16/17   Kayleen Memos, DO  omega-3 acid ethyl esters (LOVAZA) 1 g capsule Take 1 g by mouth in the morning, at noon, in the evening, and at bedtime.    [provider]  pantoprazole (PROTONIX) 40 MG tablet Take 1 tablet (40 mg total) by mouth 2 (two) times daily before a meal. 01/02/20 04/01/20  Annitta Needs, NP  potassium chloride (KLOR-CON) 10 MEQ tablet Take 10 mEq by mouth daily.    [provider]  pravastatin (PRAVACHOL) 80 MG tablet Take 1 tablet (80 mg total) by mouth at bedtime. 09/16/17   Kayleen Memos, DO  predniSONE  (DELTASONE) 10 MG tablet Take 4 tablets (40 mg total) by mouth 2 (two) times daily with  a meal for 3 days, THEN 3 tablets (30 mg total) 2 (two) times daily with a meal for 3 days, THEN 2 tablets (20 mg total) 2 (two) times daily with a meal for 3 days, THEN 1 tablet (10 mg total) 2 (two) times daily with a meal for 3 days. 03/17/20 03/29/20  Manuella Ghazi, Pratik D, DO  torsemide (DEMADEX) 20 MG tablet Take 2 tablets (40 mg total) by mouth daily. Patient taking differently: Take 10-20 mg by mouth 2 (two) times daily as needed (swelling). 07/02/15   Samuella Cota, MD  traMADol (ULTRAM) 50 MG tablet Take 50 mg by mouth 3 (three) times daily as needed for moderate pain.  01/07/18   [provider]  traZODone (DESYREL) 50 MG tablet Take 50 mg by mouth at bedtime.    [provider]  vitamin B-12 (CYANOCOBALAMIN) 1000 MCG tablet Take 1,000 mcg by mouth daily.    [provider]  vitamin E 200 UNIT capsule Take 200 Units by mouth daily.     [provider]  XULTOPHY 100-3.6 UNIT-MG/ML SOPN Inject 80 Units as directed at bedtime. 12/28/17   [provider]    Allergies    Tetracyclines & related, Banana, and Penicillins  Review of Systems   Review of Systems  All other systems reviewed and are negative.   Physical Exam Updated Vital Signs BP (!) 113/58   Pulse 86   Temp 98.5 F (36.9 C) (Oral)   Resp 18   Ht 1.727 m (5' 8" )   Wt 103 kg   SpO2 95%   BMI 34.53 kg/m   Physical Exam Vitals and nursing note reviewed.  Constitutional:      General: She is in acute distress.     Appearance: She is well-developed and well-nourished. She is ill-appearing.  HENT:     Head: Normocephalic and atraumatic.     Mouth/Throat:     Mouth: Oropharynx is clear and moist.     Pharynx: No oropharyngeal exudate.  Eyes:     General: No scleral icterus.       Right eye: No discharge.        Left eye: No discharge.     Extraocular Movements: EOM normal.      Conjunctiva/sclera: Conjunctivae normal.     Pupils: Pupils are equal, round, and reactive to light.  Neck:     Thyroid: No thyromegaly.     Vascular: No JVD.  Cardiovascular:     Rate and Rhythm: Regular rhythm. Tachycardia present.     Pulses: Intact distal pulses.     Heart sounds: Normal heart sounds. No murmur heard. No friction rub. No gallop.   Pulmonary:     Effort: Respiratory distress present.     Breath sounds: Rhonchi and rales present. No wheezing.     Comments: Tachypnea, rales, speaks in full sentences Abdominal:     General: Bowel sounds are normal. There is no distension.     Palpations: Abdomen is soft. There is no mass.     Tenderness: There is no abdominal tenderness.  Musculoskeletal:        General: No tenderness or edema. Normal range of motion.     Cervical back: Normal range of motion and neck supple.  Lymphadenopathy:     Cervical: No cervical adenopathy.  Skin:    General: Skin is warm and dry.     Findings: No erythema or rash.  Neurological:     Mental Status: She is alert.  Coordination: Coordination normal.  Psychiatric:        Mood and Affect: Mood and affect normal.        Behavior: Behavior normal.     ED Results / Procedures / Treatments   Labs (all labs ordered are listed, but only abnormal results are displayed) Labs Reviewed  CBC WITH DIFFERENTIAL/PLATELET - Abnormal; Notable for the following components:      Result Value   WBC 18.4 (*)    RBC 5.77 (*)    MCV 76.6 (*)    MCH 24.6 (*)    RDW 17.3 (*)    Platelets 443 (*)    Neutro Abs 16.6 (*)    Abs Immature Granulocytes 0.16 (*)    All other components within normal limits  COMPREHENSIVE METABOLIC PANEL - Abnormal; Notable for the following components:   Sodium 129 (*)    Potassium 3.3 (*)    Chloride 93 (*)    Glucose, Bld 280 (*)    BUN 66 (*)    Creatinine, Ser 1.78 (*)    Calcium 8.4 (*)    Albumin 2.7 (*)    AST 49 (*)    ALT 77 (*)    Alkaline Phosphatase  263 (*)    GFR, Estimated 31 (*)    All other components within normal limits  TROPONIN I (HIGH SENSITIVITY) - Abnormal; Notable for the following components:   Troponin I (High Sensitivity) 82 (*)    All other components within normal limits  CULTURE, BLOOD (ROUTINE X 2)  CULTURE, BLOOD (ROUTINE X 2)  LACTIC ACID, PLASMA  PROCALCITONIN  C-REACTIVE PROTEIN    EKG EKG Interpretation  Date/Time:  Sunday March 17 2020 19:09:05 EST Ventricular Rate:  87 PR Interval:    QRS Duration: 96 QT Interval:  450 QTC Calculation: 542 R Axis:   -6 Text Interpretation: Sinus rhythm Probable left ventricular hypertrophy Prolonged QT interval since last tracing no significant change Confirmed by ,  ([id number]) on 03/17/2020 7:21:56 PM   Radiology DG Chest Port 1 View  Result Date: 03/17/2020 CLINICAL DATA:  Chest pain and cough with history of COVID-19 positivity EXAM: PORTABLE CHEST 1 VIEW COMPARISON:  03/15/2020 FINDINGS: Cardiac shadow is stable. Persistent and slightly increased airspace opacity is noted bilaterally consistent with given clinical history. No sizable effusion is noted. No bony abnormality is seen. IMPRESSION: Increased airspace opacity bilaterally consistent with the given clinical history of COVID-19 positivity. Electronically Signed   By: Mark  Lukens M.D.   On: 03/17/2020 19:41   ECHOCARDIOGRAM LIMITED  Result Date: 03/16/2020    ECHOCARDIOGRAM LIMITED REPORT   Patient Name:   Oliveah W Uriegas Date of Exam: 03/16/2020 Medical Rec #:  [id number]   Height:       68.0 in Accession #:    [id number]  Weight:       227.3 lb Date of Birth:  06/07/1950   BSA:          2.158 m Patient Age:    69 years    BP:           156/81 mmHg Patient Gender: F           HR:           73  bpm. Exam Location:  [facility] Procedure: Limited Echo, Cardiac Doppler and Color Doppler Indications:    Dyspnea  History:        Patient has prior history of Echocardiogram examinations, most  recent 09/16/2017. COPD, Signs/Symptoms:Dyspnea; Risk                 Factors:Hypertension, Diabetes and Obesity. COVID+.  Sonographer:    Dustin Flock RDCS Referring Phys: [id number] [facility]  Sonographer Comments: COVID+ IMPRESSIONS  1. Left ventricular ejection fraction, by estimation, is 55 to 60%. The left ventricle has normal function. The left ventricle has no regional wall motion abnormalities. Left ventricular diastolic parameters are consistent with Grade I diastolic dysfunction (impaired relaxation).  2. Right ventricular systolic function is normal. The right ventricular size is normal.  3. The mitral valve is normal in structure. No evidence of mitral valve regurgitation. No evidence of mitral stenosis.  4. The aortic valve is normal in structure. Aortic valve regurgitation is not visualized. No aortic stenosis is present.  5. The inferior vena cava is normal in size with greater than 50% respiratory variability, suggesting right atrial pressure of 3 mmHg. FINDINGS  Left Ventricle: Left ventricular ejection fraction, by estimation, is 55 to 60%. The left ventricle has normal function. The left ventricle has no regional wall motion abnormalities. The left ventricular internal cavity size was normal in size. There is  no left ventricular hypertrophy. Left ventricular diastolic parameters are consistent with Grade I diastolic dysfunction (impaired relaxation). Indeterminate filling pressures. Right Ventricle: The right ventricular size is normal. No increase in right ventricular wall thickness. Right ventricular systolic function is normal. Left Atrium: Left atrial size was normal in size. Right Atrium: Right atrial size was normal in size. Pericardium: There is no evidence of pericardial effusion. Mitral Valve: The mitral valve is normal in structure. No evidence of mitral valve stenosis. Tricuspid Valve: The tricuspid valve is normal in structure. Tricuspid valve regurgitation is not demonstrated.  No evidence of tricuspid stenosis. Aortic Valve: The aortic valve is normal in structure. Aortic valve regurgitation is not visualized. No aortic stenosis is present. Pulmonic Valve: The pulmonic valve was grossly normal. Pulmonic valve regurgitation is not visualized. No evidence of pulmonic stenosis. Aorta: The aortic root is normal in size and structure. Venous: The inferior vena cava is normal in size with greater than 50% respiratory variability, suggesting right atrial pressure of 3 mmHg. IAS/Shunts: No atrial level shunt detected by color flow Doppler. LEFT VENTRICLE PLAX 2D LVIDd:         3.75 cm  Diastology LVIDs:         2.76 cm  LV e' medial:    6.42 cm/s LV PW:         1.14 cm  LV E/e' medial:  13.5 LV IVS:        1.17 cm  LV e' lateral:   7.72 cm/s LVOT diam:     2.00 cm  LV E/e' lateral: 11.2 LVOT Area:     3.14 cm  RIGHT VENTRICLE RV S prime:     10.30 cm/s LEFT ATRIUM         Index LA diam:    3.30 cm 1.53 cm/m   AORTA Ao Root diam: 2.90 cm MITRAL VALVE MV Area (PHT): 3.63 cm    SHUNTS MV Decel Time: 209 msec    Systemic Diam: 2.00 cm MV E velocity: 86.50 cm/s MV A velocity: 94.70 cm/s MV E/A ratio:  0.91 Mihai Croitoru MD Electronically signed by Sanda Klein MD Signature Date/Time: 03/16/2020/1:51:47 PM    Final     Procedures Procedures   Medications Ordered in ED Medications  levofloxacin (LEVAQUIN) IVPB 750 mg (750 mg Intravenous  New Bag/Given 03/17/20 2059)  albuterol (VENTOLIN HFA) 108 (90 Base) MCG/ACT inhaler 6 puff (6 puffs Inhalation Given 03/17/20 1937)  AeroChamber Plus Flo-Vu Medium MISC 1 each (1 each Other Given 03/17/20 1938)  aspirin chewable tablet 324 mg (324 mg Oral Given 03/17/20 1938)    ED Course  I have reviewed the triage vital signs and the nursing notes.  Pertinent labs & imaging results that were available during my care of the patient were reviewed by me and considered in my medical decision making (see chart for details).  Clinical Course as of  03/17/20 2114  Nancy Fetter Mar 17, 2020  1956 Since prior chest x-ray, ongoing infiltrate seen consistent with COVID-19 [BM]    Clinical Course User Index [BM] Noemi Chapel, MD   MDM Rules/Calculators/A&P                          This patient appears acutely ill, she has what appears to be a worsening respiratory syndrome, review of the medical record showed that she did receive remdesivir, steroids and was discharged on oxygen however she has required up to 15 L by nasal cannula and is now 93%.  I personally viewed her x-ray which shows that her infiltrates are slightly worsening.  Her laboratory work-up shows that she has a leukocytosis of over 18,000 with a neutrophil predominance suggestive of a bacterial underlying cause.  The patient is likely septic, she has been given antibiotics, she does not have any signs of shock  Troponin is 82, lactate is pending, CRP is pending, procalcitonin is pending  Discussed the care with Dr. Shanon Brow of the hospitalist service who has been kind enough to admit this very sick patient  Final Clinical Impression(s) / ED Diagnoses Final diagnoses:  Sepsis, due to unspecified organism, unspecified whether acute organ dysfunction present ([place])  HCAP (healthcare-associated pneumonia)  AKI (acute kidney injury) ([place])  Hyponatremia      Noemi Chapel, MD 03/17/20 2114

## 2020-03-17 NOTE — H&P (Signed)
History and Physical    KELENA GARROW OMB:[id number] DOB: 1951-01-20 DOA: 03/17/2020  PCP: [name], FNP  Patient coming from: Home  Chief Complaint: Shortness of breath  HPI: Stephanie [name] is a 70 y.o. female with medical history significant of Covid pneumonia just discharged today to home from the hospital after completing remdesivir on steroids.  Patient normally is on 2 L of oxygen and was discharged on 6 L with exertion but at rest was only requiring 4 L.  She was transferred by ambulance home by the time she got home she was extremely short of breath and hypoxic.  She is currently on 15 L high flow nasal cannula.  Sats are in the low 80s on 8 9 L.  She does have underlying COPD.  Patient be referred for admission for hypoxia.  Review of Systems: As per HPI otherwise 10 point review of systems negative.   Past Medical History:  Diagnosis Date  . Anemia   . Arthritis   . Asthma   . COPD (chronic obstructive pulmonary disease) ([place])   . Deep vein thrombosis (DVT) of both lower extremities ([place]) 06/27/2015  . Diabetes mellitus   . Fibromyalgia   . GERD (gastroesophageal reflux disease)   . H/O hiatal hernia   . Hypercholesteremia   . Hypertension   . Hyperthyroidism   . IBS (irritable bowel syndrome)   . Inner ear disease   . 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 PONV (postoperative nausea and vomiting)   . Tachycardia     Past Surgical History:  Procedure Laterality Date  . ABDOMINAL HYSTERECTOMY  partial  . CARPAL TUNNEL RELEASE Right 1991  . CATARACT EXTRACTION W/PHACO Right 05/08/2013   Procedure: CATARACT EXTRACTION PHACO AND INTRAOCULAR LENS PLACEMENT (IOC);  Surgeon: Tonny Branch, MD;  Location: [facility];  Service: Ophthalmology;  Laterality: Right;  CDE 10.31  . CATARACT EXTRACTION W/PHACO Left 08/17/2013   Procedure: CATARACT EXTRACTION PHACO AND INTRAOCULAR LENS PLACEMENT (IOC);  Surgeon: Tonny Branch, MD;  Location: [facility];  Service:  Ophthalmology;  Laterality: Left;  CDE:9.03  . CHOLECYSTECTOMY  1971  . COLONOSCOPY WITH PROPOFOL N/A 01/06/2016   Dr. Gala Romney: diverticulosis   . DENTAL SURGERY    . ESOPHAGEAL BRUSHING  08/29/2019   Procedure: ESOPHAGEAL BRUSHING;  Surgeon: Daneil Dolin, MD;  Location: [facility];  Service: Endoscopy;;  . ESOPHAGOGASTRODUODENOSCOPY (EGD) WITH PROPOFOL N/A 01/06/2016   Dr. Gala Romney: normal s/p empiric dilation   . ESOPHAGOGASTRODUODENOSCOPY (EGD) WITH PROPOFOL N/A 08/29/2019   esophageal plaques vs medication residue adherent to tubular esophagus s/p KOH brushing and dilation. Medium-sized hiatal hernia. + for candida. Diflucan.   Marland Kitchen EYE SURGERY    . MALONEY DILATION N/A 01/06/2016   Procedure: Venia Minks DILATION;  Surgeon: Daneil Dolin, MD;  Location: [facility];  Service: Endoscopy;  Laterality: N/A;  . Venia Minks DILATION N/A 08/29/2019   Procedure: Venia Minks DILATION;  Surgeon: Daneil Dolin, MD;  Location[facility];  Service: Endoscopy;  Laterality: N/A;  . REVERSE SHOULDER ARTHROPLASTY Right 08/06/2017   Procedure: RIGHT REVERSE SHOULDER ARTHROPLASTY;  Surgeon: Netta Cedars, MD;  Location: [place];  Service: Orthopedics;  Laterality: Right;  . WRIST GANGLION EXCISION Left      reports that she quit smoking about 6 years ago. Her smoking use included cigarettes. She has a 7.50 pack-year smoking history. She has never used smokeless tobacco. She reports current alcohol use. She reports that she does not use drugs.  Allergies  Allergen Reactions  . Tetracyclines & Related Anaphylaxis  . Banana Hives and Nausea And Vomiting  . Penicillins Rash and Other (See Comments)    Has patient had a PCN reaction causing immediate rash, facial/tongue/throat swelling, SOB or lightheadedness with hypotension: No Has patient had a PCN reaction causing severe rash involving mucus membranes or skin necrosis: No Has patient had a PCN reaction that required hospitalization No Has patient had a PCN  reaction occurring within the last 10 years: No If all of the above answers are "NO", then may proceed with Cephalosporin use.     Family History  Problem Relation Age of Onset  . Colon cancer Neg Hx     Prior to Admission medications   Medication Sig Start Date End Date Taking? Authorizing Provider  Accu-Chek FastClix Lancets MISC Apply topically. 03/15/20   [provider]  acetaminophen (TYLENOL) 500 MG tablet Take 1,000 mg by mouth every 6 (six) hours as needed for moderate pain or headache.     [provider]  albuterol (PROVENTIL HFA;VENTOLIN HFA) 108 (90 BASE) MCG/ACT inhaler Inhale 2 puffs into the lungs every 6 (six) hours as needed for wheezing or shortness of breath.     [provider]  apixaban (ELIQUIS) 5 MG TABS tablet Take 10 mg by mouth BID through Friday 5/19 evening. Take 68m by mouth BID starting 5/20 AM. Patient taking differently: Take 5 mg by mouth 2 (two) times daily. 07/25/15   LLendon Colonel NP  ARNUITY ELLIPTA 100 MCG/ACT AEPB Inhale 1 puff into the lungs at bedtime.  06/13/17   [provider]  Ascorbic Acid (VITAMIN C) 1000 MG tablet Take 1,000 mg by mouth daily.     [provider]  cetirizine (ZYRTEC) 10 MG tablet Take 10 mg by mouth daily.    [provider]  Cholecalciferol (VITAMIN D) 2000 units tablet Take 4,000 Units by mouth daily.     [provider]  cimetidine (TAGAMET) 200 MG tablet Take 0.5 tablets (100 mg total) by mouth at bedtime. 02/28/18   LMahala Menghini PA-C  CINNAMON PO Take 2-3 capsules by mouth 2 (two) times daily as needed (high blood sugar).     [provider]  clobetasol ointment (TEMOVATE) 03.55% Apply 1 application topically 3 (three) times daily as needed (imflammation).  06/20/15   [provider]  Cod Liver Oil CAPS Take 1 capsule by mouth at bedtime.     [provider]  diclofenac sodium (VOLTAREN) 1 % GEL Apply 2 g topically 2 (two) times  daily. 09/16/17   HKayleen Memos DO  diphenhydrAMINE (BENADRYL) 25 MG tablet Take 25 mg by mouth every 6 (six) hours as needed for itching.    [provider]  Doxepin HCl 5 % CREA Apply 2 g topically 3 (three) times daily as needed (itching).  05/28/16   [provider]  DULoxetine (CYMBALTA) 30 MG capsule Take 1 capsule (30 mg total) by mouth 2 (two) times daily. 09/16/17   HKayleen Memos DO  fluticasone (VERAMYST) 27.5 MCG/SPRAY nasal spray Place 2 sprays into the nose daily.     [provider]  guaiFENesin (ROBITUSSIN) 100 MG/5ML SOLN Take 5 mLs (100 mg total) by mouth every 4 (four) hours as needed for cough or to loosen phlegm. 03/17/20   SManuella Ghazi Pratik D, DO  HUMALOG KWIKPEN 100 UNIT/ML KiwkPen Inject 30-47 Units into the skin See admin instructions. If blood sugar is  90-150=  45 units, 150-200= 46 units. 200-250= 47 units at breakfast and supper Lunchtime: If BS is  90-150=30 units,150- 200=31 units,200-250 units 32 09/30/17   [provider]  hydrOXYzine (ATARAX/VISTARIL) 25 MG tablet Take 25 mg by mouth every 8 (eight) hours as needed for itching.  06/20/15   [provider]  levothyroxine (SYNTHROID) 88 MCG tablet Take 88 mcg by mouth daily before breakfast.    [provider]  lubiprostone (AMITIZA) 8 MCG capsule Take 1 capsule (8 mcg total) by mouth 2 (two) times daily as needed for constipation. 01/02/20   Annitta Needs, NP  LYRICA 75 MG capsule Take 75 mg by mouth 2 (two) times daily.  07/18/15   [provider]  methocarbamol (ROBAXIN) 500 MG tablet Take 1 tablet (500 mg total) by mouth 3 (three) times daily as needed. Patient taking differently: Take 500 mg by mouth 3 (three) times daily as needed for muscle spasms. 08/06/17   Netta Cedars, MD  metolazone (ZAROXOLYN) 5 MG tablet Take 1 tablet (5 mg total) by mouth 2 (two) times daily. Patient taking differently: Take 10 mg by mouth at bedtime. 07/02/15   Samuella Cota, MD   metoprolol tartrate (LOPRESSOR) 25 MG tablet Take 1 tablet (25 mg total) by mouth 2 (two) times daily. Patient taking differently: Take 25-37.5 mg by mouth See admin instructions. Take 37.5 mg in the morning and 25 mg at night 09/16/17   Kayleen Memos, DO  omega-3 acid ethyl esters (LOVAZA) 1 g capsule Take 1 g by mouth in the morning, at noon, in the evening, and at bedtime.    [provider]  pantoprazole (PROTONIX) 40 MG tablet Take 1 tablet (40 mg total) by mouth 2 (two) times daily before a meal. 01/02/20 04/01/20  Annitta Needs, NP  potassium chloride (KLOR-CON) 10 MEQ tablet Take 10 mEq by mouth daily.    [provider]  pravastatin (PRAVACHOL) 80 MG tablet Take 1 tablet (80 mg total) by mouth at bedtime. 09/16/17   Kayleen Memos, DO  predniSONE (DELTASONE) 10 MG tablet Take 4 tablets (40 mg total) by mouth 2 (two) times daily with a meal for 3 days, THEN 3 tablets (30 mg total) 2 (two) times daily with a meal for 3 days, THEN 2 tablets (20 mg total) 2 (two) times daily with a meal for 3 days, THEN 1 tablet (10 mg total) 2 (two) times daily with a meal for 3 days. 03/17/20 03/29/20  Manuella Ghazi, Pratik D, DO  torsemide (DEMADEX) 20 MG tablet Take 2 tablets (40 mg total) by mouth daily. Patient taking differently: Take 10-20 mg by mouth 2 (two) times daily as needed (swelling). 07/02/15   Samuella Cota, MD  traMADol (ULTRAM) 50 MG tablet Take 50 mg by mouth 3 (three) times daily as needed for moderate pain.  01/07/18   [provider]  traZODone (DESYREL) 50 MG tablet Take 50 mg by mouth at bedtime.    [provider]  vitamin B-12 (CYANOCOBALAMIN) 1000 MCG tablet Take 1,000 mcg by mouth daily.    [provider]  vitamin E 200 UNIT capsule Take 200 Units by mouth daily.     [provider]  XULTOPHY 100-3.6 UNIT-MG/ML SOPN Inject 80 Units as directed at bedtime. 12/28/17   [provider]    Physical Exam: Vitals:   03/17/20 2000  03/17/20 2030 03/17/20 2100 03/17/20 2130  BP: 126/74 134/77 (!) 113/58 104/76  Pulse: 82 85 86  80  Resp: 14 20 18 16   Temp:      TempSrc:      SpO2: 95% 93% 95% 96%  Weight:      Height:          Constitutional: NAD, calm, comfortable Vitals:   03/17/20 2000 03/17/20 2030 03/17/20 2100 03/17/20 2130  BP: 126/74 134/77 (!) 113/58 104/76  Pulse: 82 85 86 80  Resp: 14 20 18 16   Temp:      TempSrc:      SpO2: 95% 93% 95% 96%  Weight:      Height:       Eyes: PERRL, lids and conjunctivae normal ENMT: Mucous membranes are moist. Posterior pharynx clear of any exudate or lesions.Normal dentition.  Neck: normal, supple, no masses, no thyromegaly Respiratory: clear to auscultation bilaterally, no wheezing, no crackles. Normal respiratory effort. No accessory muscle use.  Cardiovascular: Regular rate and rhythm, no murmurs / rubs / gallops. No extremity edema. 2+ pedal pulses. No carotid bruits.  Abdomen: no tenderness, no masses palpated. No hepatosplenomegaly. Bowel sounds positive.  Musculoskeletal: no clubbing / cyanosis. No joint deformity upper and lower extremities. Good ROM, no contractures. Normal muscle tone.  Skin: no rashes, lesions, ulcers. No induration Neurologic: CN 2-12 grossly intact. Sensation intact, DTR normal. Strength 5/5 in all 4.  Psychiatric: Normal judgment and insight. Alert and oriented x 3. Normal mood.    Labs on Admission: I have personally reviewed following labs and imaging studies  CBC: Recent Labs  Lab 03/13/20 0521 03/14/20 0636 03/15/20 0400 03/16/20 0716 03/17/20 0500 03/17/20 1906  WBC 12.7* 13.4* 11.8* 11.8* 15.2* 18.4*  NEUTROABS 11.4* 11.6* 9.7* 10.4*  --  16.6*  HGB 13.1 12.3 12.2 13.8 13.8 14.2  HCT 42.3 39.5 39.3 43.6 43.0 44.2  MCV 79.2* 78.8* 79.6* 78.3* 77.1* 76.6*  PLT 425* 410* 400 387 370 355*   Basic Metabolic Panel: Recent Labs  Lab 03/12/20 0657 03/13/20 0521 03/14/20 0636 03/15/20 0400 03/16/20 0716  03/17/20 0500 03/17/20 1906  NA 133* 137 135 135 134* 131* 129*  K 4.6 4.0 3.6 3.5 3.7 3.6 3.3*  CL 98 101 106 103 99 95* 93*  CO2 24 25 23 23 27 26 24   GLUCOSE 429* 369* 152* 183* 240* 189* 280*  BUN 51* 48* 44* 45* 50* 58* 66*  CREATININE 1.35* 1.25* 1.06* 1.18* 1.39* 1.43* 1.78*  CALCIUM 8.4* 8.4* 8.1* 8.1* 8.1* 8.3* 8.4*  MG 2.5* 2.4 2.2 2.1 1.9 1.8  --   PHOS 3.0 3.0 3.4 3.4 4.4  --   --    GFR: Estimated Creatinine Clearance: 37.4 mL/min (A) (by C-G formula based on SCr of 1.78 mg/dL (H)). Liver Function Tests: Recent Labs  Lab 03/14/20 0636 03/15/20 0400 03/16/20 0716 03/17/20 0500 03/17/20 1906  AST 35 59* 51* 43* 49*  ALT 33 52* 68* 64* 77*  ALKPHOS 168* 177* 223* 212* 263*  BILITOT 0.6 0.4 0.6 0.5 0.6  PROT 6.8 6.5 7.2 6.8 7.8  ALBUMIN 2.0* 2.0* 2.3* 2.2* 2.7*   No results for input(s): LIPASE, AMYLASE in the last 168 hours. No results for input(s): AMMONIA in the last 168 hours. Coagulation Profile: No results for input(s): INR, PROTIME in the last 168 hours. Cardiac Enzymes: No results for input(s): CKTOTAL, CKMB, CKMBINDEX, TROPONINI in the last 168 hours. BNP (last 3 results) No results for input(s): PROBNP in the last 8760 hours. HbA1C: No results for input(s): HGBA1C in the last 72 hours. CBG: Recent Labs  Lab 03/16/20 1609 03/16/20 2024 03/17/20 0344 03/17/20 0743 03/17/20 1113  GLUCAP 394* 394* 188* 184* 312*   Lipid Profile: No results for input(s): CHOL, HDL, LDLCALC, TRIG, CHOLHDL, LDLDIRECT in the last 72 hours. Thyroid Function Tests: No results for input(s): TSH, T4TOTAL, FREET4, T3FREE, THYROIDAB in the last 72 hours. Anemia Panel: Recent Labs    03/15/20 0400 03/16/20 0716  FERRITIN 568* 591*   Urine analysis:    Component Value Date/Time   COLORURINE YELLOW 12/04/2019 1116   APPEARANCEUR CLOUDY (A) 12/04/2019 1116   LABSPEC 1.020 12/04/2019 1116   PHURINE 5.0 12/04/2019 1116   GLUCOSEU 150 (A) 12/04/2019 1116   HGBUR  NEGATIVE 12/04/2019 1116   [place] 12/04/2019 1116   KETONESUR NEGATIVE 12/04/2019 1116   PROTEINUR 100 (A) 12/04/2019 1116   UROBILINOGEN 0.2 02/10/2014 1255   NITRITE NEGATIVE 12/04/2019 1116   LEUKOCYTESUR NEGATIVE 12/04/2019 1116   Sepsis Labs: !!!!!!!!!!!!!!!!!!!!!!!!!!!!!!!!!!!!!!!!!!!! @LABRCNTIP (procalcitonin:4,lacticidven:4) ) Recent Results (from the past 240 hour(s))  Blood Culture (routine x 2)     Status: None   Collection Time: 03/11/20  1:45 PM   Specimen: BLOOD  Result Value Ref Range Status   Specimen Description BLOOD  Final   Special Requests NONE  Final   Culture   Final    NO GROWTH 5 DAYS Performed at [facility], [address] [place], [place] [address]    Report Status 03/16/2020 FINAL  Final  SARS Coronavirus 2 by RT PCR (hospital order, performed in [place] hospital lab) Nasopharyngeal Nasopharyngeal Swab     Status: Abnormal   Collection Time: 03/11/20  1:47 PM   Specimen: Nasopharyngeal Swab  Result Value Ref Range Status   SARS Coronavirus 2 POSITIVE (A) NEGATIVE Final    Comment: RESULT CALLED TO, READ BACK BY AND VERIFIED WITH: MYRICK,B AT 1779 ON 03/11/20 BY HUFFINES,S. (NOTE) SARS-CoV-2 target nucleic acids are DETECTED  SARS-CoV-2 RNA is generally detectable in upper respiratory specimens  during the acute phase of infection.  Positive results are indicative  of the presence of the identified virus, but do not rule out bacterial infection or co-infection with other pathogens not detected by the test.  Clinical correlation with patient history and  other diagnostic information is necessary to determine patient infection status.  The expected result is negative.  Fact Sheet for Patients:   StrictlyIdeas.no   Fact Sheet for Healthcare Providers:   BankingDealers.co.za    This test is not yet approved or cleared by the [place] FDA and  has been authorized for detection  and/or diagnosis of SARS-CoV-2 by FDA under an Emergency Use Authorization (EUA).  This EUA will remain in effect (meanin g this test can be used) for the duration of  the COVID-19 declaration under Section 564(b)(1) of the Act, 21 U.S.C. section 360-bbb-3(b)(1), unless the authorization is terminated or revoked sooner.  Performed at [facility], [address], [place], [place] [address]   Blood Culture (routine x 2)     Status: None   Collection Time: 03/11/20  1:58 PM   Specimen: BLOOD  Result Value Ref Range Status   Specimen Description BLOOD LEFT ARM  Final   Special Requests   Final    BOTTLES DRAWN AEROBIC AND ANAEROBIC Blood Culture adequate volume   Culture   Final    NO GROWTH 5 DAYS Performed at [facility], [address], [place],  [address]    Report Status 03/16/2020 FINAL  Final     Radiological Exams  on Admission: DG Chest Port 1 View  Result Date: 03/17/2020 CLINICAL DATA:  Chest pain and cough with history of COVID-19 positivity EXAM: PORTABLE CHEST 1 VIEW COMPARISON:  03/15/2020 FINDINGS: Cardiac shadow is stable. Persistent and slightly increased airspace opacity is noted bilaterally consistent with given clinical history. No sizable effusion is noted. No bony abnormality is seen. IMPRESSION: Increased airspace opacity bilaterally consistent with the given clinical history of COVID-19 positivity. Electronically Signed   By: Inez Catalina M.D.   On: 03/17/2020 19:41   ECHOCARDIOGRAM LIMITED  Result Date: 03/16/2020    ECHOCARDIOGRAM LIMITED REPORT   Patient Name:   ALAINA DONATI Kocsis Date of Exam: 03/16/2020 Medical Rec #:  [id number]   Height:       68.0 in Accession #:    [id number]  Weight:       227.3 lb Date of Birth:  1950-09-17   BSA:          2.158 m Patient Age:    89 years    BP:           156/81 mmHg Patient Gender: F           HR:           73 bpm. Exam Location:  [facility] Procedure: Limited Echo, Cardiac Doppler and Color Doppler Indications:    Dyspnea   History:        Patient has prior history of Echocardiogram examinations, most                 recent 09/16/2017. COPD, Signs/Symptoms:Dyspnea; Risk                 Factors:Hypertension, Diabetes and Obesity. COVID+.  Sonographer:    Dustin Flock RDCS Referring Phys: [id number] Royanne Foots [facility]  Sonographer Comments: COVID+ IMPRESSIONS  1. Left ventricular ejection fraction, by estimation, is 55 to 60%. The left ventricle has normal function. The left ventricle has no regional wall motion abnormalities. Left ventricular diastolic parameters are consistent with Grade I diastolic dysfunction (impaired relaxation).  2. Right ventricular systolic function is normal. The right ventricular size is normal.  3. The mitral valve is normal in structure. No evidence of mitral valve regurgitation. No evidence of mitral stenosis.  4. The aortic valve is normal in structure. Aortic valve regurgitation is not visualized. No aortic stenosis is present.  5. The inferior vena cava is normal in size with greater than 50% respiratory variability, suggesting right atrial pressure of 3 mmHg. FINDINGS  Left Ventricle: Left ventricular ejection fraction, by estimation, is 55 to 60%. The left ventricle has normal function. The left ventricle has no regional wall motion abnormalities. The left ventricular internal cavity size was normal in size. There is  no left ventricular hypertrophy. Left ventricular diastolic parameters are consistent with Grade I diastolic dysfunction (impaired relaxation). Indeterminate filling pressures. Right Ventricle: The right ventricular size is normal. No increase in right ventricular wall thickness. Right ventricular systolic function is normal. Left Atrium: Left atrial size was normal in size. Right Atrium: Right atrial size was normal in size. Pericardium: There is no evidence of pericardial effusion. Mitral Valve: The mitral valve is normal in structure. No evidence of mitral valve stenosis. Tricuspid Valve:  The tricuspid valve is normal in structure. Tricuspid valve regurgitation is not demonstrated. No evidence of tricuspid stenosis. Aortic Valve: The aortic valve is normal in structure. Aortic valve regurgitation is not visualized. No aortic stenosis is present. Pulmonic Valve: The pulmonic valve was  grossly normal. Pulmonic valve regurgitation is not visualized. No evidence of pulmonic stenosis. Aorta: The aortic root is normal in size and structure. Venous: The inferior vena cava is normal in size with greater than 50% respiratory variability, suggesting right atrial pressure of 3 mmHg. IAS/Shunts: No atrial level shunt detected by color flow Doppler. LEFT VENTRICLE PLAX 2D LVIDd:         3.75 cm  Diastology LVIDs:         2.76 cm  LV e' medial:    6.42 cm/s LV PW:         1.14 cm  LV E/e' medial:  13.5 LV IVS:        1.17 cm  LV e' lateral:   7.72 cm/s LVOT diam:     2.00 cm  LV E/e' lateral: 11.2 LVOT Area:     3.14 cm  RIGHT VENTRICLE RV S prime:     10.30 cm/s LEFT ATRIUM         Index LA diam:    3.30 cm 1.53 cm/m   AORTA Ao Root diam: 2.90 cm MITRAL VALVE MV Area (PHT): 3.63 cm    SHUNTS MV Decel Time: 209 msec    Systemic Diam: 2.00 cm MV E velocity: 86.50 cm/s MV A velocity: 94.70 cm/s MV E/A ratio:  0.91 Mihai Croitoru MD Electronically signed by Sanda Klein MD Signature Date/Time: 03/16/2020/1:51:47 PM    Final     Old chart reviewed Case discussed with EDP   Assessment/Plan  70 year old female with recent Covid pneumonia comes back with acute on chronic respiratory failure  Principal Problem:    Acute respiratory disease due to COVID-19 virus=-continue Decadron.  Check procalcitonin level, lactic acid, CRP.  No fever.  Given antibiotics in the emergency department will not continue at this time until check pro-Cal level.  GFR is too low for baricitinib.  Continue supportive care.  On chronic anticoagulation at home already.  Active Problems:    Pneumonia due to COVID-19 virus-as  above    Hypertension-clarify resume home meds.    COPD (chronic obstructive pulmonary disease) (HCC)-placed on inhaler Combivent    MGUS (monoclonal gammopathy of unknown significance)-noted    Further recommendations pending on overall hospital course   DVT prophylaxis: Eliquis Code Status: Full Family Communication: None Disposition Plan: Days Consults called: None Admission status: Admission   Jeralynn Vaquera A MD [facility]  If 7PM-7AM, please contact night-coverage [email or web address] Password [facility]  03/17/2020, 9:41 PM

## 2020-03-17 NOTE — Plan of Care (Signed)

## 2020-03-17 NOTE — Discharge Summary (Signed)
Physician Discharge Summary  Stephanie [name] YYT:[id number] DOB: [birth year] DOA: 03/11/2020  PCP: [name], FNP  Admit date: 03/11/2020  Discharge date: 03/17/2020  Admitted From: Home  Disposition: Home  Recommendations for Outpatient Follow-up:  1. Follow up with PCP in 1-2 weeks 2. Continue on steroid taper as prescribed 3. Follow-up with pulmonology outpatient with referral sent regarding COPD and need for oxygen 4. Continue other home medications.  Home Health: None  Equipment/Devices: Home oxygen up to 6 L on exertion  Discharge Condition:Stable  CODE STATUS: Full  Diet recommendation: Heart Healthy/carb modified  Brief/Interim Summary: As per H&P written by Dr. Denton Brick on 03/11/2020 Stephanie Husbands Doeis a 20 y.o.femalewith medical history significant forCOPD, hypertension, hypothyroidism, diabetes mellitus, DVT. Presented to the ED with complaints of fevers, nasal congestion, vomiting, cough of about 6 days duration. Difficulty breathing started 2 days ago. She is vaccinated for [employer], butshe did not get the booster dose. She has some mild dull central chest pain from coughing. No lower extremity swelling. Patient is on Eliquis, but she reports she has not taking Eliquis in about 2 weeks because she was not feeling well.  ED Course:Impression 97. Heart rate tachycardic to 123, respiratory rate 18-28. Blood pressure systolic 275T to 700F. O2 sat 78% on room air, requiring 5 L of O2via nasal cannula-sats now 94 to 97%. D-dimer 1.99. Procalcitonin 0.79. Creatinine elevated 1.77. WBC 12.1. BNP 68.Lactic acid 2.5. Portable chest x-ray subtle bilateral heterogeneous and interstitial airspace opacity concerning for infection. Steroids and remdesivir initiated. Hospitalist to admit for Covid pneumonia.  -Patient was treated for acute hypoxemic respiratory failure in setting of COVID-19 pneumonia and has completed her course of remdesivir.  Her resting  oxygen requirements have decreased to 2 L nasal cannula, but she does continue to require up to 6 L on exertion.  2D echocardiogram was performed with no significant valvular abnormalities and LVEF 55 to 60% with grade 1 diastolic dysfunction noted.  She does have history of COPD which is likely complicating her course of treatment and causing her to require home amounts of oxygen.  She will need ambulatory follow-up with pulmonology in the outpatient setting which will be arranged.  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  Discharge Instructions  Discharge Instructions    Ambulatory referral to Pulmonology   Complete by: As directed    Reason for referral: Asthma/COPD   Diet - low sodium heart healthy   Complete by: As directed    Increase activity slowly   Complete by: As directed    No wound care   Complete by: As directed      Allergies as of 03/17/2020      Reactions   Tetracyclines & Related Anaphylaxis   Banana Hives, Nausea And Vomiting   Penicillins Rash, Other (See Comments)   Has patient had a PCN reaction causing immediate rash, facial/tongue/throat swelling, SOB or lightheadedness with hypotension: No Has patient had a PCN reaction causing severe rash involving mucus membranes or skin necrosis: No Has patient had a PCN reaction that required hospitalization No Has patient had a PCN reaction  occurring within the last 10 years: No If all of the above answers are "NO", then may proceed with Cephalosporin use.      Medication List    TAKE these  medications   acetaminophen 500 MG tablet Commonly known as: TYLENOL Take 1,000 mg by mouth every 6 (six) hours as needed for moderate pain or headache.   albuterol 108 (90 Base) MCG/ACT inhaler Commonly known as: VENTOLIN HFA Inhale 2 puffs into the lungs every 6 (six) hours as needed for wheezing or shortness of breath. What changed: Another medication with the same name was removed. Continue taking this medication, and follow the directions you see here.   apixaban 5 MG Tabs tablet Commonly known as: ELIQUIS Take 10 mg by mouth BID through Friday 5/19 evening. Take 73m by mouth BID starting 5/20 AM. What changed:   how much to take  how to take this  when to take this  additional instructions   Arnuity Ellipta 100 MCG/ACT Aepb Generic drug: Fluticasone Furoate Inhale 1 puff into the lungs at bedtime.   cetirizine 10 MG tablet Commonly known as: ZYRTEC Take 10 mg by mouth daily.   cimetidine 200 MG tablet Commonly known as: TAGAMET Take 0.5 tablets (100 mg total) by mouth at bedtime.   CINNAMON PO Take 2-3 capsules by mouth 2 (two) times daily as needed (high blood sugar).   clobetasol ointment 0.05 % Commonly known as: TEMOVATE Apply 1 application topically 3 (three) times daily as needed (imflammation).   Cod Liver Oil Caps Take 1 capsule by mouth at bedtime.   diclofenac sodium 1 % Gel Commonly known as: VOLTAREN Apply 2 g topically 2 (two) times daily.   diphenhydrAMINE 25 MG tablet Commonly known as: BENADRYL Take 25 mg by mouth every 6 (six) hours as needed for itching.   Doxepin HCl 5 % Crea Apply 2 g topically 3 (three) times daily as needed (itching).   DULoxetine 30 MG capsule Commonly known as: CYMBALTA Take 1 capsule (30 mg total) by mouth 2 (two) times daily.   fluticasone 27.5 MCG/SPRAY nasal spray Commonly known as: VERAMYST Place 2 sprays into the nose daily.   guaiFENesin 100 MG/5ML Soln Commonly known as: ROBITUSSIN Take 5 mLs (100  mg total) by mouth every 4 (four) hours as needed for cough or to loosen phlegm.   HumaLOG KwikPen 100 UNIT/ML KwikPen Generic drug: insulin lispro Inject 30-47 Units into the skin See admin instructions. If blood sugar is  90-150= 45 units, 150-200= 46 units. 200-250= 47 units at breakfast and supper Lunchtime: If BS is  90-150=30 units,150- 200=31 units,200-250 units 32   hydrOXYzine 25 MG tablet Commonly known as: ATARAX/VISTARIL Take 25 mg by mouth every 8 (eight) hours as needed for itching.   levothyroxine 88 MCG tablet Commonly known as: SYNTHROID Take 88 mcg by mouth daily before breakfast.   lubiprostone 8 MCG capsule Commonly known as: Amitiza Take 1 capsule (8 mcg total) by mouth 2 (two) times daily as needed for constipation.   Lyrica 75 MG capsule Generic drug: pregabalin Take 75 mg by mouth 2 (two) times daily.   methocarbamol 500 MG tablet Commonly known as: Robaxin Take 1 tablet (500 mg total) by mouth 3 (three) times daily as needed. What changed: reasons to take this   metolazone 5 MG tablet Commonly known as: ZAROXOLYN Take 1 tablet (5 mg total) by mouth 2 (two) times daily. What changed:   how much to take  when to take this  metoprolol tartrate 25 MG tablet Commonly known as: LOPRESSOR Take 1 tablet (25 mg total) by mouth 2 (two) times daily. What changed:   how much to take  when to take this  additional instructions   omega-3 acid ethyl esters 1 g capsule Commonly known as: LOVAZA Take 1 g by mouth in the morning, at noon, in the evening, and at bedtime.   pantoprazole 40 MG tablet Commonly known as: PROTONIX Take 1 tablet (40 mg total) by mouth 2 (two) times daily before a meal.   potassium chloride 10 MEQ tablet Commonly known as: KLOR-CON Take 10 mEq by mouth daily.   pravastatin 80 MG tablet Commonly known as: PRAVACHOL Take 1 tablet (80 mg total) by mouth at bedtime.   predniSONE 10 MG tablet Commonly known as:  DELTASONE Take 4 tablets (40 mg total) by mouth 2 (two) times daily with a meal for 3 days, THEN 3 tablets (30 mg total) 2 (two) times daily with a meal for 3 days, THEN 2 tablets (20 mg total) 2 (two) times daily with a meal for 3 days, THEN 1 tablet (10 mg total) 2 (two) times daily with a meal for 3 days. Start taking on: March 17, 2020   torsemide 20 MG tablet Commonly known as: DEMADEX Take 2 tablets (40 mg total) by mouth daily. What changed:   how much to take  when to take this  reasons to take this   traMADol 50 MG tablet Commonly known as: ULTRAM Take 50 mg by mouth 3 (three) times daily as needed for moderate pain.   traZODone 50 MG tablet Commonly known as: DESYREL Take 50 mg by mouth at bedtime.   vitamin B-12 1000 MCG tablet Commonly known as: CYANOCOBALAMIN Take 1,000 mcg by mouth daily.   vitamin C 1000 MG tablet Take 1,000 mg by mouth daily.   Vitamin D 50 MCG (2000 UT) tablet Take 4,000 Units by mouth daily.   vitamin E 200 UNIT capsule Take 200 Units by mouth daily.   Xultophy 100-3.6 UNIT-MG/ML Sopn Generic drug: Insulin Degludec-Liraglutide Inject 80 Units as directed at bedtime.            Durable Medical Equipment  (From admission, onward)         Start     Ordered   03/17/20 1205  For home use only DME oxygen  Once       Question Answer Comment  Length of Need 12 Months   Mode or (Route) Nasal cannula   Liters per Minute 6   Frequency Continuous (stationary and portable oxygen unit needed)   Oxygen conserving device Yes   Oxygen delivery system Gas      03/17/20 1204          Follow-up Information    [name], FNP Follow up in 1 week(s).   Specialty: Family Medicine Contact information: [place] [address] [phone]              Allergies  Allergen Reactions  . Tetracyclines & Related Anaphylaxis  . Banana Hives and Nausea And Vomiting  . Penicillins Rash and Other  (See Comments)    Has patient had a PCN reaction causing immediate rash, facial/tongue/throat swelling, SOB or lightheadedness with hypotension: No Has patient had a PCN reaction causing severe rash involving mucus membranes or skin necrosis: No Has patient had a PCN reaction that required hospitalization No Has patient had a PCN reaction occurring within the  last 10 years: No If all of the above answers are "NO", then may proceed with Cephalosporin use.     Consultations:  None   Procedures/Studies: DG Chest Port 1 View  Result Date: 03/15/2020 CLINICAL DATA:  70 year old female with shortness of breath. Positive COVID-19 4 days ago. MGUS. EXAM: PORTABLE CHEST 1 VIEW COMPARISON:  Portable chest 03/11/2020 and earlier. FINDINGS: Portable AP upright view at 0829 hours. Stable lung volumes. Normal cardiac size and mediastinal contours. Visualized tracheal air column is within normal limits. Patchy and indistinct mostly peripheral bilateral pulmonary opacity persists, with asymmetric increased density at the left lung base. No pneumothorax or pleural effusion. Visualized tracheal air column is within normal limits. Stable visualized osseous structures. Prior shoulder arthroplasty. Paucity of bowel gas in the upper abdomen. IMPRESSION: Stable since 03/11/2020 patchy asymmetric pulmonary opacity compatible with COVID-19 pneumonia. Electronically Signed   By: Genevie Ann M.D.   On: 03/15/2020 08:44   DG Chest Portable 1 View  Result Date: 03/11/2020 CLINICAL DATA:  Cough EXAM: PORTABLE CHEST 1 VIEW COMPARISON:  09/15/2017 FINDINGS: The heart size and mediastinal contours are within normal limits. Subtle bilateral heterogeneous and interstitial airspace opacity. The visualized skeletal structures are unremarkable. IMPRESSION: Subtle bilateral heterogeneous and interstitial airspace opacity, concerning for infection, including COVID-19 if clinically suspected. Electronically Signed   By: Eddie Candle M.D.    On: 03/11/2020 15:01   ECHOCARDIOGRAM LIMITED  Result Date: 03/16/2020    ECHOCARDIOGRAM LIMITED REPORT   Patient Name:   Stephanie [name] Date of Exam: 03/16/2020 Medical Rec #:  [id number]   Height:       68.0 in Accession #:    [id number]  Weight:       227.3 lb Date of Birth:  [birth year]   BSA:          2.158 m Patient Age:    24 years    BP:           156/81 mmHg Patient Gender: F           HR:           73 bpm. Exam Location:  [facility] Procedure: Limited Echo, Cardiac Doppler and Color Doppler Indications:    Dyspnea  History:        Patient has prior history of Echocardiogram examinations, most                 recent 09/16/2017. COPD, Signs/Symptoms:Dyspnea; Risk                 Factors:Hypertension, Diabetes and Obesity. COVID+.  Sonographer:    Dustin Flock RDCS Referring Phys: [id number] Royanne Foots [facility]  Sonographer Comments: COVID+ IMPRESSIONS  1. Left ventricular ejection fraction, by estimation, is 55 to 60%. The left ventricle has normal function. The left ventricle has no regional wall motion abnormalities. Left ventricular diastolic parameters are consistent with Grade I diastolic dysfunction (impaired relaxation).  2. Right ventricular systolic function is normal. The right ventricular size is normal.  3. The mitral valve is normal in structure. No evidence of mitral valve regurgitation. No evidence of mitral stenosis.  4. The aortic valve is normal in structure. Aortic valve regurgitation is not visualized. No aortic stenosis is present.  5. The inferior vena cava is normal in size with greater than 50% respiratory variability, suggesting right atrial pressure of 3 mmHg. FINDINGS  Left Ventricle: Left ventricular ejection fraction, by estimation, is 55 to 60%. The left ventricle has normal function. The  left ventricle has no regional wall motion abnormalities. The left ventricular internal cavity size was normal in size. There is  no left ventricular hypertrophy. Left ventricular diastolic parameters  are consistent with Grade I diastolic dysfunction (impaired relaxation). Indeterminate filling pressures. Right Ventricle: The right ventricular size is normal. No increase in right ventricular wall thickness. Right ventricular systolic function is normal. Left Atrium: Left atrial size was normal in size. Right Atrium: Right atrial size was normal in size. Pericardium: There is no evidence of pericardial effusion. Mitral Valve: The mitral valve is normal in structure. No evidence of mitral valve stenosis. Tricuspid Valve: The tricuspid valve is normal in structure. Tricuspid valve regurgitation is not demonstrated. No evidence of tricuspid stenosis. Aortic Valve: The aortic valve is normal in structure. Aortic valve regurgitation is not visualized. No aortic stenosis is present. Pulmonic Valve: The pulmonic valve was grossly normal. Pulmonic valve regurgitation is not visualized. No evidence of pulmonic stenosis. Aorta: The aortic root is normal in size and structure. Venous: The inferior vena cava is normal in size with greater than 50% respiratory variability, suggesting right atrial pressure of 3 mmHg. IAS/Shunts: No atrial level shunt detected by color flow Doppler. LEFT VENTRICLE PLAX 2D LVIDd:         3.75 cm  Diastology LVIDs:         2.76 cm  LV e' medial:    6.42 cm/s LV PW:         1.14 cm  LV E/e' medial:  13.5 LV IVS:        1.17 cm  LV e' lateral:   7.72 cm/s LVOT diam:     2.00 cm  LV E/e' lateral: 11.2 LVOT Area:     3.14 cm  RIGHT VENTRICLE RV S prime:     10.30 cm/s LEFT ATRIUM         Index LA diam:    3.30 cm 1.53 cm/m   AORTA Ao Root diam: 2.90 cm MITRAL VALVE MV Area (PHT): 3.63 cm    SHUNTS MV Decel Time: 209 msec    Systemic Diam: 2.00 cm MV E velocity: 86.50 cm/s MV A velocity: 94.70 cm/s MV E/A ratio:  0.91 Mihai Croitoru MD Electronically signed by Sanda Klein MD Signature Date/Time: 03/16/2020/1:51:47 PM    Final       Discharge Exam: Vitals:   03/17/20 0924 03/17/20 0934  BP:  115/70   Pulse: 88   Resp:    Temp:    SpO2: 96% 95%   Vitals:   03/16/20 1814 03/16/20 2021 03/17/20 0924 03/17/20 0934  BP:  (!) 136/106 115/70   Pulse: 87 69 88   Resp:  18    Temp:  (!) 97.5 F (36.4 C)    TempSrc:  Oral    SpO2: 93% 92% 96% 95%  Weight:      Height:        General: Pt is alert, awake, not in acute distress, obese Cardiovascular: RRR, S1/S2 +, no rubs, no gallops Respiratory: CTA bilaterally, no wheezing, no rhonchi, on 2L [place] Abdominal: Soft, NT, ND, bowel sounds + Extremities: no edema, no cyanosis    The results of significant diagnostics from this hospitalization (including imaging, microbiology, ancillary and laboratory) are listed below for reference.     Microbiology: Recent Results (from the past 240 hour(s))  Blood Culture (routine x 2)     Status: None   Collection Time: 03/11/20  1:45 PM   Specimen: BLOOD  Result  Value Ref Range Status   Specimen Description BLOOD  Final   Special Requests NONE  Final   Culture   Final    NO GROWTH 5 DAYS Performed at [facility], [address] [place], [place] [address]    Report Status 03/16/2020 FINAL  Final  SARS Coronavirus 2 by RT PCR (hospital order, performed in [facility] hospital lab) Nasopharyngeal Nasopharyngeal Swab     Status: Abnormal   Collection Time: 03/11/20  1:47 PM   Specimen: Nasopharyngeal Swab  Result Value Ref Range Status   SARS Coronavirus 2 POSITIVE (A) NEGATIVE Final    Comment: RESULT CALLED TO, READ BACK BY AND VERIFIED WITH: MYRICK,B AT 1515 ON 03/11/20 BY HUFFINES,S. (NOTE) SARS-CoV-2 target nucleic acids are DETECTED  SARS-CoV-2 RNA is generally detectable in upper respiratory specimens  during the acute phase of infection.  Positive results are indicative  of the presence of the identified virus, but do not rule out bacterial infection or co-infection with other pathogens not detected by the test.  Clinical correlation with patient history and  other  diagnostic information is necessary to determine patient infection status.  The expected result is negative.  Fact Sheet for Patients:   StrictlyIdeas.no   Fact Sheet for Healthcare Providers:   BankingDealers.co.za    This test is not yet approved or cleared by the [place] FDA and  has been authorized for detection and/or diagnosis of SARS-CoV-2 by FDA under an Emergency Use Authorization (EUA).  This EUA will remain in effect (meanin g this test can be used) for the duration of  the COVID-19 declaration under Section 564(b)(1) of the Act, 21 U.S.C. section 360-bbb-3(b)(1), unless the authorization is terminated or revoked sooner.  Performed at [facility], [address], [place], [place] [address]   Blood Culture (routine x 2)     Status: None   Collection Time: 03/11/20  1:58 PM   Specimen: BLOOD  Result Value Ref Range Status   Specimen Description BLOOD LEFT ARM  Final   Special Requests   Final    BOTTLES DRAWN AEROBIC AND ANAEROBIC Blood Culture adequate volume   Culture   Final    NO GROWTH 5 DAYS Performed at [facility], [address], [place],  [address]    Report Status 03/16/2020 FINAL  Final     Labs: BNP (last 3 results) Recent Labs    03/11/20 1358 03/15/20 0400  BNP 68.0 46.5   Basic Metabolic Panel: Recent Labs  Lab 03/12/20 0657 03/13/20 0521 03/14/20 0636 03/15/20 0400 03/16/20 0716 03/17/20 0500  NA 133* 137 135 135 134* 131*  K 4.6 4.0 3.6 3.5 3.7 3.6  CL 98 101 106 103 99 95*  CO2 24 25 23 23 27 26   GLUCOSE 429* 369* 152* 183* 240* 189*  BUN 51* 48* 44* 45* 50* 58*  CREATININE 1.35* 1.25* 1.06* 1.18* 1.39* 1.43*  CALCIUM 8.4* 8.4* 8.1* 8.1* 8.1* 8.3*  MG 2.5* 2.4 2.2 2.1 1.9 1.8  PHOS 3.0 3.0 3.4 3.4 4.4  --    Liver Function Tests: Recent Labs  Lab 03/13/20 0521 03/14/20 0636 03/15/20 0400 03/16/20 0716 03/17/20 0500  AST 22 35 59* 51* 43*  ALT 32 33 52* 68* 64*   ALKPHOS 203* 168* 177* 223* 212*  BILITOT 0.4 0.6 0.4 0.6 0.5  PROT 7.5 6.8 6.5 7.2 6.8  ALBUMIN 2.1* 2.0* 2.0* 2.3* 2.2*   No results for input(s): LIPASE, AMYLASE in the last 168 hours. No  results for input(s): AMMONIA in the last 168 hours. CBC: Recent Labs  Lab 03/12/20 0657 03/13/20 0521 03/14/20 0636 03/15/20 0400 03/16/20 0716 03/17/20 0500  WBC 6.4 12.7* 13.4* 11.8* 11.8* 15.2*  NEUTROABS 5.5 11.4* 11.6* 9.7* 10.4*  --   HGB 12.8 13.1 12.3 12.2 13.8 13.8  HCT 41.4 42.3 39.5 39.3 43.6 43.0  MCV 79.9* 79.2* 78.8* 79.6* 78.3* 77.1*  PLT 345 425* 410* 400 387 370   Cardiac Enzymes: No results for input(s): CKTOTAL, CKMB, CKMBINDEX, TROPONINI in the last 168 hours. BNP: Invalid input(s): POCBNP CBG: Recent Labs  Lab 03/16/20 1609 03/16/20 2024 03/17/20 0344 03/17/20 0743 03/17/20 1113  GLUCAP 394* 394* 188* 184* 312*   D-Dimer Recent Labs    03/15/20 0400 03/16/20 0716  DDIMER 1.14* 1.35*   Hgb A1c No results for input(s): HGBA1C in the last 72 hours. Lipid Profile No results for input(s): CHOL, HDL, LDLCALC, TRIG, CHOLHDL, LDLDIRECT in the last 72 hours. Thyroid function studies No results for input(s): TSH, T4TOTAL, T3FREE, THYROIDAB in the last 72 hours.  Invalid input(s): FREET3 Anemia work up Recent Labs    03/15/20 0400 03/16/20 0716  FERRITIN 568* 591*   Urinalysis    Component Value Date/Time   COLORURINE YELLOW 12/04/2019 1116   APPEARANCEUR CLOUDY (A) 12/04/2019 1116   LABSPEC 1.020 12/04/2019 1116   PHURINE 5.0 12/04/2019 1116   GLUCOSEU 150 (A) 12/04/2019 1116   [place] 12/04/2019 1116   [place] 12/04/2019 1116   [place] 12/04/2019 1116   PROTEINUR 100 (A) 12/04/2019 1116   UROBILINOGEN 0.2 02/10/2014 1255   NITRITE NEGATIVE 12/04/2019 1116   [place] 12/04/2019 1116   Sepsis Labs Invalid input(s): PROCALCITONIN,  WBC,  LACTICIDVEN Microbiology Recent Results (from the past 240  hour(s))  Blood Culture (routine x 2)     Status: None   Collection Time: 03/11/20  1:45 PM   Specimen: BLOOD  Result Value Ref Range Status   Specimen Description BLOOD  Final   Special Requests NONE  Final   Culture   Final    NO GROWTH 5 DAYS Performed at [facility], [address], [place], [place] [address]    Report Status 03/16/2020 FINAL  Final  SARS Coronavirus 2 by RT PCR (hospital order, performed in [place] hospital lab) Nasopharyngeal Nasopharyngeal Swab     Status: Abnormal   Collection Time: 03/11/20  1:47 PM   Specimen: Nasopharyngeal Swab  Result Value Ref Range Status   SARS Coronavirus 2 POSITIVE (A) NEGATIVE Final    Comment: RESULT CALLED TO, READ BACK BY AND VERIFIED WITH: MYRICK,B AT 2800 ON 03/11/20 BY HUFFINES,S. (NOTE) SARS-CoV-2 target nucleic acids are DETECTED  SARS-CoV-2 RNA is generally detectable in upper respiratory specimens  during the acute phase of infection.  Positive results are indicative  of the presence of the identified virus, but do not rule out bacterial infection or co-infection with other pathogens not detected by the test.  Clinical correlation with patient history and  other diagnostic information is necessary to determine patient infection status.  The expected result is negative.  Fact Sheet for Patients:   StrictlyIdeas.no   Fact Sheet for Healthcare Providers:   BankingDealers.co.za    This test is not yet approved or cleared by the [place] FDA and  has been authorized for detection and/or diagnosis of SARS-CoV-2 by FDA under an Emergency Use Authorization (EUA).  This EUA will remain in effect (meanin g this test can be used)  for the duration of  the COVID-19 declaration under Section 564(b)(1) of the Act, 21 U.S.C. section 360-bbb-3(b)(1), unless the authorization is terminated or revoked sooner.  Performed at [facility], [address] [place], [place]  [address]   Blood Culture (routine x 2)     Status: None   Collection Time: 03/11/20  1:58 PM   Specimen: BLOOD  Result Value Ref Range Status   Specimen Description BLOOD LEFT ARM  Final   Special Requests   Final    BOTTLES DRAWN AEROBIC AND ANAEROBIC Blood Culture adequate volume   Culture   Final    NO GROWTH 5 DAYS Performed at [facility], [address], [place], [place] [address]    Report Status 03/16/2020 FINAL  Final     Time coordinating discharge: 35 minutes  SIGNED:   Rodena Goldmann, DO [facility] 03/17/2020, 12:19 PM  If 7PM-7AM, please contact night-coverage [email or web address]

## 2020-03-17 NOTE — ED Notes (Signed)
Assisted pt with changing into a gown, pt increasingly sob with tachypnea and decreased O2 sats. Required increasing oxygen titration, ultimately requiring 15L via [place] to obtain sats in low 90s, Dr. Sabra Heck aware.

## 2020-03-17 NOTE — ED Notes (Signed)
Date and time results received: 03/17/20 2345   Test: LACTIC Critical Value: 3.7  Name of Provider Notified: Josephine Cables, MD

## 2020-03-17 NOTE — Progress Notes (Signed)
SATURATION QUALIFICATIONS: (This note is used to comply with regulatory documentation for home oxygen)  Patient Saturations on Room Air at Rest = 87%  Patient Saturations on Room Air while Ambulating = 79%  Patient Saturations on 6 Liters of oxygen while Ambulating = 90%  Please briefly explain why patient needs home oxygen:  Pt needs 2L oxygen to keeps sats in 90s at rest and 6L oxygen to keep sats at 90% after ambulation.

## 2020-03-17 NOTE — ED Notes (Signed)
Called pts daughter Tommie Sams to let her know pt is being moved to room 338. Received a non name identified voicemail and left a generic message for return call, advised that there is not an emergency but wanted to update her.

## 2020-03-17 NOTE — ED Triage Notes (Signed)
Pt discharged from hospital today. Returns today after being SOB @ home and sats of 84% on home O2 of 8L/min.

## 2020-03-18 ENCOUNTER — Encounter (HOSPITAL_COMMUNITY): Payer: Self-pay | Admitting: Family Medicine

## 2020-03-18 DIAGNOSIS — U071 COVID-19: Principal | ICD-10-CM

## 2020-03-18 DIAGNOSIS — J069 Acute upper respiratory infection, unspecified: Secondary | ICD-10-CM | POA: Diagnosis not present

## 2020-03-18 LAB — GLUCOSE, CAPILLARY
Glucose-Capillary: 71 mg/dL (ref 70–99)
Glucose-Capillary: 216 mg/dL — ABNORMAL HIGH (ref 70–99)
Glucose-Capillary: >600 mg/dL (ref 70–99)
Glucose-Capillary: 586 mg/dL (ref 70–99)
Glucose-Capillary: 597 mg/dL (ref 70–99)
Glucose-Capillary: 469 mg/dL — ABNORMAL HIGH (ref 70–99)
Glucose-Capillary: 415 mg/dL — ABNORMAL HIGH (ref 70–99)

## 2020-03-18 LAB — CBC WITH DIFFERENTIAL/PLATELET
Abs Immature Granulocytes: 0.11 10*3/uL — ABNORMAL HIGH (ref 0.00–0.07)
Basophils Absolute: 0 10*3/uL (ref 0.0–0.1)
Basophils Relative: 0 %
Eosinophils Absolute: 0 10*3/uL (ref 0.0–0.5)
Eosinophils Relative: 0 %
HCT: 43.9 % (ref 36.0–46.0)
Hemoglobin: 13.9 g/dL (ref 12.0–15.0)
Immature Granulocytes: 1 %
Lymphocytes Relative: 5 %
Lymphs Abs: 0.8 10*3/uL (ref 0.7–4.0)
MCH: 24.6 pg — ABNORMAL LOW (ref 26.0–34.0)
MCHC: 31.7 g/dL (ref 30.0–36.0)
MCV: 77.6 fL — ABNORMAL LOW (ref 80.0–100.0)
Monocytes Absolute: 0.3 10*3/uL (ref 0.1–1.0)
Monocytes Relative: 2 %
Neutro Abs: 14.8 10*3/uL — ABNORMAL HIGH (ref 1.7–7.7)
Neutrophils Relative %: 92 %
Platelets: 379 10*3/uL (ref 150–400)
RBC: 5.66 MIL/uL — ABNORMAL HIGH (ref 3.87–5.11)
RDW: 16.8 % — ABNORMAL HIGH (ref 11.5–15.5)
WBC: 15.9 10*3/uL — ABNORMAL HIGH (ref 4.0–10.5)
nRBC: 0 % (ref 0.0–0.2)

## 2020-03-18 LAB — COMPREHENSIVE METABOLIC PANEL
ALT: 69 U/L — ABNORMAL HIGH (ref 0–44)
AST: 35 U/L (ref 15–41)
Albumin: 2.4 g/dL — ABNORMAL LOW (ref 3.5–5.0)
Alkaline Phosphatase: 224 U/L — ABNORMAL HIGH (ref 38–126)
Anion gap: 14 (ref 5–15)
BUN: 69 mg/dL — ABNORMAL HIGH (ref 8–23)
CO2: 26 mmol/L (ref 22–32)
Calcium: 8.3 mg/dL — ABNORMAL LOW (ref 8.9–10.3)
Chloride: 91 mmol/L — ABNORMAL LOW (ref 98–111)
Creatinine, Ser: 1.6 mg/dL — ABNORMAL HIGH (ref 0.44–1.00)
GFR, Estimated: 35 mL/min — ABNORMAL LOW (ref >60)
Glucose, Bld: 324 mg/dL — ABNORMAL HIGH (ref 70–99)
Potassium: 3.5 mmol/L (ref 3.5–5.1)
Sodium: 131 mmol/L — ABNORMAL LOW (ref 135–145)
Total Bilirubin: 0.6 mg/dL (ref 0.3–1.2)
Total Protein: 7.1 g/dL (ref 6.5–8.1)

## 2020-03-18 LAB — C-REACTIVE PROTEIN
CRP: 3.4 mg/dL — ABNORMAL HIGH (ref <1.0)
CRP: 2.5 mg/dL — ABNORMAL HIGH (ref <1.0)

## 2020-03-18 LAB — GLUCOSE, RANDOM: Glucose, Bld: 596 mg/dL (ref 70–99)

## 2020-03-18 LAB — LACTIC ACID, PLASMA
Lactic Acid, Venous: 2.8 mmol/L (ref 0.5–1.9)
Lactic Acid, Venous: 2.2 mmol/L (ref 0.5–1.9)

## 2020-03-18 LAB — D-DIMER, QUANTITATIVE: D-Dimer, Quant: 1.78 ug/mL-FEU — ABNORMAL HIGH (ref 0.00–0.50)

## 2020-03-18 LAB — PROCALCITONIN: Procalcitonin: 0.16 ng/mL

## 2020-03-18 MED ORDER — METHYLPREDNISOLONE SODIUM SUCC 125 MG IJ SOLR
50.0000 mg | Freq: Two times a day (BID) | INTRAMUSCULAR | Status: DC
  Administered 2020-03-19 – 2020-03-20 (×3): 50 mg via INTRAVENOUS
  Filled 2020-03-18 (×4): qty 2

## 2020-03-18 MED ORDER — INSULIN ASPART 100 UNIT/ML ~~LOC~~ SOLN: 0.0000 [IU] | Freq: Every day | SUBCUTANEOUS | Status: DC

## 2020-03-18 MED ORDER — INSULIN ASPART 100 UNIT/ML ~~LOC~~ SOLN: 4.0000 [IU] | Freq: Three times a day (TID) | SUBCUTANEOUS | Status: DC

## 2020-03-18 MED ORDER — INSULIN ASPART 100 UNIT/ML IV SOLN
10.0000 [IU] | Freq: Once | INTRAVENOUS | Status: AC
  Administered 2020-03-18: 10 [IU] via INTRAVENOUS

## 2020-03-18 MED ORDER — INSULIN ASPART 100 UNIT/ML ~~LOC~~ SOLN: 0.0000 [IU] | Freq: Three times a day (TID) | SUBCUTANEOUS | Status: DC

## 2020-03-18 MED ORDER — INSULIN ASPART 100 UNIT/ML ~~LOC~~ SOLN
20.0000 [IU] | Freq: Three times a day (TID) | SUBCUTANEOUS | Status: DC
  Administered 2020-03-19: 20 [IU] via SUBCUTANEOUS

## 2020-03-18 MED ORDER — INSULIN DETEMIR 100 UNIT/ML ~~LOC~~ SOLN
40.0000 [IU] | Freq: Two times a day (BID) | SUBCUTANEOUS | Status: DC
  Administered 2020-03-18: 40 [IU] via SUBCUTANEOUS
  Filled 2020-03-18 (×3): qty 0.4

## 2020-03-18 MED ORDER — INSULIN DETEMIR 100 UNIT/ML ~~LOC~~ SOLN
50.0000 [IU] | Freq: Two times a day (BID) | SUBCUTANEOUS | Status: DC
  Administered 2020-03-18 – 2020-03-19 (×2): 50 [IU] via SUBCUTANEOUS
  Filled 2020-03-18 (×4): qty 0.5

## 2020-03-18 MED ORDER — APIXABAN 5 MG PO TABS
5.0000 mg | ORAL_TABLET | Freq: Two times a day (BID) | ORAL | Status: DC
  Administered 2020-03-18 – 2020-03-22 (×9): 5 mg via ORAL
  Filled 2020-03-18 (×9): qty 1

## 2020-03-18 MED ORDER — TORSEMIDE 20 MG PO TABS
40.0000 mg | ORAL_TABLET | Freq: Every day | ORAL | Status: DC
  Administered 2020-03-18 – 2020-03-19 (×2): 40 mg via ORAL
  Filled 2020-03-18 (×2): qty 2

## 2020-03-18 MED ORDER — ASCORBIC ACID 500 MG PO TABS
1000.0000 mg | ORAL_TABLET | Freq: Every day | ORAL | Status: DC
  Administered 2020-03-18 – 2020-03-22 (×5): 1000 mg via ORAL
  Filled 2020-03-18 (×6): qty 2

## 2020-03-18 MED ORDER — METOPROLOL TARTRATE 25 MG PO TABS
25.0000 mg | ORAL_TABLET | Freq: Two times a day (BID) | ORAL | Status: DC
  Administered 2020-03-18 – 2020-03-22 (×9): 25 mg via ORAL
  Filled 2020-03-18 (×9): qty 1

## 2020-03-18 MED ORDER — LEVOTHYROXINE SODIUM 88 MCG PO TABS
88.0000 ug | ORAL_TABLET | Freq: Every day | ORAL | Status: DC
  Administered 2020-03-18 – 2020-03-22 (×5): 88 ug via ORAL
  Filled 2020-03-18 (×5): qty 1

## 2020-03-18 MED ORDER — PANTOPRAZOLE SODIUM 40 MG PO TBEC
40.0000 mg | DELAYED_RELEASE_TABLET | Freq: Two times a day (BID) | ORAL | Status: DC
Start: 2020-03-18 — End: 2020-03-22
  Administered 2020-03-18 – 2020-03-22 (×8): 40 mg via ORAL
  Filled 2020-03-18 (×8): qty 1

## 2020-03-18 MED ORDER — INSULIN ASPART 100 UNIT/ML ~~LOC~~ SOLN
30.0000 [IU] | Freq: Once | SUBCUTANEOUS | Status: AC
  Administered 2020-03-18: 30 [IU] via SUBCUTANEOUS

## 2020-03-18 MED ORDER — LUBIPROSTONE 8 MCG PO CAPS
8.0000 ug | ORAL_CAPSULE | Freq: Two times a day (BID) | ORAL | Status: DC | PRN
  Filled 2020-03-18: qty 1

## 2020-03-18 MED ORDER — INSULIN ASPART 100 UNIT/ML ~~LOC~~ SOLN
0.0000 [IU] | SUBCUTANEOUS | Status: DC
  Administered 2020-03-18: 20 [IU] via SUBCUTANEOUS
  Administered 2020-03-19: 15 [IU] via SUBCUTANEOUS
  Administered 2020-03-19: 3 [IU] via SUBCUTANEOUS
  Administered 2020-03-19: 20 [IU] via SUBCUTANEOUS
  Administered 2020-03-19: 7 [IU] via SUBCUTANEOUS
  Administered 2020-03-19: 20 [IU] via SUBCUTANEOUS
  Administered 2020-03-19: 3 [IU] via SUBCUTANEOUS
  Administered 2020-03-20 (×2): 15 [IU] via SUBCUTANEOUS
  Administered 2020-03-20: 20 [IU] via SUBCUTANEOUS
  Administered 2020-03-20: 11 [IU] via SUBCUTANEOUS
  Administered 2020-03-20: 7 [IU] via SUBCUTANEOUS
  Administered 2020-03-20: 11 [IU] via SUBCUTANEOUS
  Administered 2020-03-21: 7 [IU] via SUBCUTANEOUS
  Administered 2020-03-21 (×2): 11 [IU] via SUBCUTANEOUS
  Administered 2020-03-21: 4 [IU] via SUBCUTANEOUS
  Administered 2020-03-21 – 2020-03-22 (×3): 7 [IU] via SUBCUTANEOUS
  Administered 2020-03-22: 15 [IU] via SUBCUTANEOUS

## 2020-03-18 MED ORDER — TRAZODONE HCL 50 MG PO TABS
50.0000 mg | ORAL_TABLET | Freq: Every day | ORAL | Status: DC
  Administered 2020-03-18 – 2020-03-21 (×4): 50 mg via ORAL
  Filled 2020-03-18 (×4): qty 1

## 2020-03-18 MED ORDER — INSULIN ASPART 100 UNIT/ML ~~LOC~~ SOLN
12.0000 [IU] | Freq: Three times a day (TID) | SUBCUTANEOUS | Status: DC
  Administered 2020-03-18: 20 [IU] via SUBCUTANEOUS
  Administered 2020-03-18: 12 [IU] via SUBCUTANEOUS

## 2020-03-18 MED ORDER — IPRATROPIUM-ALBUTEROL 20-100 MCG/ACT IN AERS
1.0000 | INHALATION_SPRAY | Freq: Three times a day (TID) | RESPIRATORY_TRACT | Status: DC
  Administered 2020-03-19 – 2020-03-22 (×10): 1 via RESPIRATORY_TRACT

## 2020-03-18 MED ORDER — PRAVASTATIN SODIUM 40 MG PO TABS
80.0000 mg | ORAL_TABLET | Freq: Every day | ORAL | Status: DC
Start: 2020-03-18 — End: 2020-03-22
  Administered 2020-03-18 – 2020-03-21 (×4): 80 mg via ORAL
  Filled 2020-03-18 (×4): qty 2

## 2020-03-18 MED ORDER — PREGABALIN 75 MG PO CAPS
75.0000 mg | ORAL_CAPSULE | Freq: Two times a day (BID) | ORAL | Status: DC
  Administered 2020-03-18 – 2020-03-22 (×9): 75 mg via ORAL
  Filled 2020-03-18 (×9): qty 1

## 2020-03-18 MED ORDER — METHYLPREDNISOLONE SODIUM SUCC 125 MG IJ SOLR
100.0000 mg | Freq: Two times a day (BID) | INTRAMUSCULAR | Status: DC
  Administered 2020-03-18: 100 mg via INTRAVENOUS
  Filled 2020-03-18: qty 2

## 2020-03-18 MED ORDER — VITAMIN D 25 MCG (1000 UNIT) PO TABS
4000.0000 [IU] | ORAL_TABLET | Freq: Every day | ORAL | Status: DC
  Administered 2020-03-18 – 2020-03-22 (×5): 4000 [IU] via ORAL
  Filled 2020-03-18 (×5): qty 4

## 2020-03-18 MED ORDER — TRAMADOL HCL 50 MG PO TABS
50.0000 mg | ORAL_TABLET | Freq: Three times a day (TID) | ORAL | Status: DC | PRN
  Administered 2020-03-18 – 2020-03-21 (×2): 50 mg via ORAL
  Filled 2020-03-18 (×2): qty 1

## 2020-03-18 MED ORDER — LORATADINE 10 MG PO TABS
10.0000 mg | ORAL_TABLET | Freq: Every day | ORAL | Status: DC
  Administered 2020-03-18 – 2020-03-22 (×5): 10 mg via ORAL
  Filled 2020-03-18 (×5): qty 1

## 2020-03-18 NOTE — Consult Note (Signed)
NAME:  MARJEAN IMPERATO, MRN:  [id number], DOB:  Jul 28, 1950, LOS: 1 ADMISSION DATE:  03/17/2020, CONSULTATION DATE:  03/18/2020 REFERRING MD:  Dr. Manuella Ghazi, [facility], CHIEF COMPLAINT:  Short of breath   Brief History:  70 yo female former smoker presented to [facility] on 03/11/20 with fever and vomiting.  She previously received two doses of [place] vaccine, but hadn't received booster.  She was found to have COVID 19 pneumonia.  She was treated with remdesivir and steroids.  She was started on supplemental oxygen.  She was noted to have mildly elevated procalcitonin and started on antibiotics.  She was discharged home on 03/17/20 with supplemental oxygen.  After discharge she felt more short of breath and noted that her oxygen levels were low (SpO2 84% on 8 liters).  She came back to hospital and re-admitted.  PCCM asked to assist with respiratory therapy.  Past Medical History:  COPD, HTN, Hypothyroidism, DM type 2, DVT, CKD 3a  Significant Hospital Events:  1/30 readmit  Consults:    Procedures:    Significant Diagnostic Tests:  Echo 03/16/20 >> EF 55 to 60%, grade 1 DD  Micro Data:  COVID 1/24 >> positive  Antimicrobials:    Interim History / Subjective:    Objective   Blood pressure (!) 128/103, pulse 85, temperature 97.7 F (36.5 C), temperature source Oral, resp. rate (!) 22, height 5' 8"  (1.727 m), weight 101.1 kg, SpO2 92 %.        Intake/Output Summary (Last 24 hours) at 03/18/2020 1407 Last data filed at 03/18/2020 0900 Gross per 24 hour  Intake 356.67 ml  Output --  Net 356.67 ml   Filed Weights   03/17/20 1902 03/18/20 0430  Weight: 103 kg 101.1 kg    Examination:  General - alert, sitting in chair, speaking in full sentences, no accessory muscle use Eyes - pupils reactive ENT - no sinus tenderness, no stridor Cardiac - regular rate/rhythm, no murmur Chest - faint b/l crackles, no wheeze Abdomen - soft, non tender, + bowel sounds Extremities - no cyanosis,  clubbing, or edema Skin - no rashes Neuro - normal strength, moves extremities, follows commands Psych - normal mood and behavior Lymphatics - no lymphadenopathy   Resolved Hospital Problem list     Assessment & Plan:   Acute hypoxic respiratory failure from COVID 19 pneumonia.   - continue supplemental oxygen to keep SpO2 85 to 95% - bronchial hygiene and mobilize as able - continue solumedrol - CRP trending down >> defer tocilizumab or barcitinib for now - follow up CXR intermittently  Former smoker with reported Hx of COPD. - continue combivent  DM type 2 poorly controlled with steroid induced hyperglycemia. CKD 3a. Hx of DVT on eliquis. Hypothyroidism. Hx of Hypertension. Hx of Hyperlipidemia. - per primary team  Best practice (evaluated daily)  Diet: heart healthy, carb modified DVT prophylaxis: eliquis GI prophylaxis: protonix Mobility: as tolerated Disposition: progressive care Code Status: full code  Labs   CBC: Recent Labs  Lab 03/14/20 0636 03/15/20 0400 03/16/20 0716 03/17/20 0500 03/17/20 1906 03/18/20 0404  WBC 13.4* 11.8* 11.8* 15.2* 18.4* 15.9*  NEUTROABS 11.6* 9.7* 10.4*  --  16.6* 14.8*  HGB 12.3 12.2 13.8 13.8 14.2 13.9  HCT 39.5 39.3 43.6 43.0 44.2 43.9  MCV 78.8* 79.6* 78.3* 77.1* 76.6* 77.6*  PLT 410* 400 387 370 443* 573    Basic Metabolic Panel: Recent Labs  Lab 03/12/20 0657 03/13/20 0521 03/14/20 0636 03/15/20 0400 03/16/20 0716 03/17/20  0500 03/17/20 1906 03/18/20 0404  NA 133* 137 135 135 134* 131* 129* 131*  K 4.6 4.0 3.6 3.5 3.7 3.6 3.3* 3.5  CL 98 101 106 103 99 95* 93* 91*  CO2 24 25 23 23 27 26 24 26   GLUCOSE 429* 369* 152* 183* 240* 189* 280* 324*  BUN 51* 48* 44* 45* 50* 58* 66* 69*  CREATININE 1.35* 1.25* 1.06* 1.18* 1.39* 1.43* 1.78* 1.60*  CALCIUM 8.4* 8.4* 8.1* 8.1* 8.1* 8.3* 8.4* 8.3*  MG 2.5* 2.4 2.2 2.1 1.9 1.8  --   --   PHOS 3.0 3.0 3.4 3.4 4.4  --   --   --    GFR: Estimated Creatinine  Clearance: 41.3 mL/min (A) (by C-G formula based on SCr of 1.6 mg/dL (H)). Recent Labs  Lab 03/11/20 1819 03/12/20 0657 03/16/20 0716 03/17/20 0500 03/17/20 1906 03/17/20 2017 03/17/20 2109 03/18/20 0052 03/18/20 0404  PROCALCITON  --   --   --   --   --   --  0.16  --   --   WBC  --    < > 11.8* 15.2* 18.4*  --   --   --  15.9*  LATICACIDVEN 2.0*  --   --   --   --  3.7*  --  2.8* 2.2*   < > = values in this interval not displayed.    Liver Function Tests: Recent Labs  Lab 03/15/20 0400 03/16/20 0716 03/17/20 0500 03/17/20 1906 03/18/20 0404  AST 59* 51* 43* 49* 35  ALT 52* 68* 64* 77* 69*  ALKPHOS 177* 223* 212* 263* 224*  BILITOT 0.4 0.6 0.5 0.6 0.6  PROT 6.5 7.2 6.8 7.8 7.1  ALBUMIN 2.0* 2.3* 2.2* 2.7* 2.4*   No results for input(s): LIPASE, AMYLASE in the last 168 hours. No results for input(s): AMMONIA in the last 168 hours.  ABG No results found for: PHART, PCO2ART, PO2ART, HCO3, TCO2, ACIDBASEDEF, O2SAT   Coagulation Profile: No results for input(s): INR, PROTIME in the last 168 hours.  Cardiac Enzymes: No results for input(s): CKTOTAL, CKMB, CKMBINDEX, TROPONINI in the last 168 hours.  HbA1C: Hgb A1c MFr Bld  Date/Time Value Ref Range Status  03/11/2020 01:53 PM 9.2 (H) 4.8 - 5.6 % Final    Comment:    (NOTE) Pre diabetes:          5.7%-6.4%  Diabetes:              >6.4%  Glycemic control for   <7.0% adults with diabetes   12/04/2019 11:16 AM 11.3 (H) 4.8 - 5.6 % Final    Comment:    (NOTE)         Prediabetes: 5.7 - 6.4         Diabetes: >6.4         Glycemic control for adults with diabetes: <7.0     CBG: Recent Labs  Lab 03/17/20 0344 03/17/20 0743 03/17/20 1113 03/17/20 2149 03/18/20 1314  GLUCAP 188* 184* 312* 299* >600*    Review of Systems:   Reviewed and negative  Past Medical History:  She,  has a past medical history of Anemia, Arthritis, Asthma, COPD (chronic obstructive pulmonary disease) ([place]), Deep vein  thrombosis (DVT) of both lower extremities ([place]) (06/27/2015), Diabetes mellitus, Fibromyalgia, GERD (gastroesophageal reflux disease), H/O hiatal hernia, Hypercholesteremia, Hypertension, Hyperthyroidism, IBS (irritable bowel syndrome), Inner ear disease, 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   Surgical  History:   Past Surgical History:  Procedure Laterality Date  . ABDOMINAL HYSTERECTOMY  partial  . CARPAL TUNNEL RELEASE Right 1991  . CATARACT EXTRACTION W/PHACO Right 05/08/2013   Procedure: CATARACT EXTRACTION PHACO AND INTRAOCULAR LENS PLACEMENT (IOC);  Surgeon: Tonny Branch, MD;  Location: [facility];  Service: Ophthalmology;  Laterality: Right;  CDE 10.31  . CATARACT EXTRACTION W/PHACO Left 08/17/2013   Procedure: CATARACT EXTRACTION PHACO AND INTRAOCULAR LENS PLACEMENT (IOC);  Surgeon: Tonny Branch, MD;  Location: [facility];  Service: Ophthalmology;  Laterality: Left;  CDE:9.03  . CHOLECYSTECTOMY  1971  . COLONOSCOPY WITH PROPOFOL N/A 01/06/2016   Dr. Gala Romney: diverticulosis   . DENTAL SURGERY    . ESOPHAGEAL BRUSHING  08/29/2019   Procedure: ESOPHAGEAL BRUSHING;  Surgeon: Daneil Dolin, MD;  Location: [facility];  Service: Endoscopy;;  . ESOPHAGOGASTRODUODENOSCOPY (EGD) WITH PROPOFOL N/A 01/06/2016   Dr. Gala Romney: normal s/p empiric dilation   . ESOPHAGOGASTRODUODENOSCOPY (EGD) WITH PROPOFOL N/A 08/29/2019   esophageal plaques vs medication residue adherent to tubular esophagus s/p KOH brushing and dilation. Medium-sized hiatal hernia. + for candida. Diflucan.   Marland Kitchen EYE SURGERY    . MALONEY DILATION N/A 01/06/2016   Procedure: Venia Minks DILATION;  Surgeon: Daneil Dolin, MD;  Location: [facility];  Service: Endoscopy;  Laterality: N/A;  . Venia Minks DILATION N/A 08/29/2019   Procedure: Venia Minks DILATION;  Surgeon: Daneil Dolin, MD;  Location: [facility];  Service: Endoscopy;  Laterality: N/A;  . REVERSE  SHOULDER ARTHROPLASTY Right 08/06/2017   Procedure: RIGHT REVERSE SHOULDER ARTHROPLASTY;  Surgeon: Netta Cedars, MD;  Location: [facility];  Service: Orthopedics;  Laterality: Right;  . WRIST GANGLION EXCISION Left      Social History:   reports that she quit smoking about 6 years ago. Her smoking use included cigarettes. She has a 7.50 pack-year smoking history. She has never used smokeless tobacco. She reports current alcohol use. She reports that she does not use drugs.   Family History:  Her family history is negative for Colon cancer.   Allergies Allergies  Allergen Reactions  . Tetracyclines & Related Anaphylaxis  . Banana Hives and Nausea And Vomiting  . Penicillins Rash and Other (See Comments)    Has patient had a PCN reaction causing immediate rash, facial/tongue/throat swelling, SOB or lightheadedness with hypotension: No Has patient had a PCN reaction causing severe rash involving mucus membranes or skin necrosis: No Has patient had a PCN reaction that required hospitalization No Has patient had a PCN reaction occurring within the last 10 years: No If all of the above answers are "NO", then may proceed with Cephalosporin use.      Home Medications  Prior to Admission medications   Medication Sig Start Date End Date Taking? Authorizing Provider  Accu-Chek FastClix Lancets MISC Apply topically. 03/15/20   [provider]  acetaminophen (TYLENOL) 500 MG tablet Take 1,000 mg by mouth every 6 (six) hours as needed for moderate pain or headache.     [provider]  albuterol (PROVENTIL HFA;VENTOLIN HFA) 108 (90 BASE) MCG/ACT inhaler Inhale 2 puffs into the lungs every 6 (six) hours as needed for wheezing or shortness of breath.     [provider]  apixaban (ELIQUIS) 5 MG TABS tablet Take 10 mg by mouth BID through Friday 5/19 evening. Take 74m by mouth BID starting 5/20 AM. Patient taking differently: Take 5 mg by mouth 2 (two) times daily. 07/25/15    LLendon Colonel NP  ARNUITY ELLIPTA 100 MCG/ACT AEPB Inhale 1 puff into the lungs at bedtime.  06/13/17   [provider]  Ascorbic Acid (VITAMIN C) 1000 MG tablet Take 1,000 mg by mouth daily.     [provider]  cetirizine (ZYRTEC) 10 MG tablet Take 10 mg by mouth daily.    [provider]  Cholecalciferol (VITAMIN D) 2000 units tablet Take 4,000 Units by mouth daily.     [provider]  cimetidine (TAGAMET) 200 MG tablet Take 0.5 tablets (100 mg total) by mouth at bedtime. 02/28/18   Mahala Menghini, PA-C  CINNAMON PO Take 2-3 capsules by mouth 2 (two) times daily as needed (high blood sugar).     [provider]  clobetasol ointment (TEMOVATE) 3.53 % Apply 1 application topically 3 (three) times daily as needed (imflammation).  06/20/15   [provider]  Cod Liver Oil CAPS Take 1 capsule by mouth at bedtime.     [provider]  diclofenac sodium (VOLTAREN) 1 % GEL Apply 2 g topically 2 (two) times daily. 09/16/17   Kayleen Memos, DO  diphenhydrAMINE (BENADRYL) 25 MG tablet Take 25 mg by mouth every 6 (six) hours as needed for itching.    [provider]  Doxepin HCl 5 % CREA Apply 2 g topically 3 (three) times daily as needed (itching).  05/28/16   [provider]  DULoxetine (CYMBALTA) 30 MG capsule Take 1 capsule (30 mg total) by mouth 2 (two) times daily. 09/16/17   Kayleen Memos, DO  fluticasone (VERAMYST) 27.5 MCG/SPRAY nasal spray Place 2 sprays into the nose daily.     [provider]  guaiFENesin (ROBITUSSIN) 100 MG/5ML SOLN Take 5 mLs (100 mg total) by mouth every 4 (four) hours as needed for cough or to loosen phlegm. 03/17/20   Manuella Ghazi, Pratik D, DO  HUMALOG KWIKPEN 100 UNIT/ML KiwkPen Inject 30-47 Units into the skin See admin instructions. If blood sugar is  90-150= 45 units, 150-200= 46 units. 200-250= 47 units at breakfast and supper Lunchtime: If BS is  90-150=30 units,150- 200=31 units,200-250  units 32 09/30/17   [provider]  hydrOXYzine (ATARAX/VISTARIL) 25 MG tablet Take 25 mg by mouth every 8 (eight) hours as needed for itching.  06/20/15   [provider]  levothyroxine (SYNTHROID) 88 MCG tablet Take 88 mcg by mouth daily before breakfast.    [provider]  lubiprostone (AMITIZA) 8 MCG capsule Take 1 capsule (8 mcg total) by mouth 2 (two) times daily as needed for constipation. 01/02/20   Annitta Needs, NP  LYRICA 75 MG capsule Take 75 mg by mouth 2 (two) times daily.  07/18/15   [provider]  methocarbamol (ROBAXIN) 500 MG tablet Take 1 tablet (500 mg total) by mouth 3 (three) times daily as needed. Patient taking differently: Take 500 mg by mouth 3 (three) times daily as needed for muscle spasms. 08/06/17   Netta Cedars, MD  metolazone (ZAROXOLYN) 5 MG tablet Take 1 tablet (5 mg total) by mouth 2 (two) times daily. Patient taking differently: Take 10 mg by mouth at bedtime. 07/02/15   Samuella Cota, MD  metoprolol tartrate (LOPRESSOR) 25 MG tablet Take 1 tablet (25 mg total) by mouth 2 (two) times daily. Patient taking differently: Take 25-37.5 mg by mouth See admin instructions. Take 37.5 mg in the morning and 25 mg at night 09/16/17   [place], Archie Patten N, DO  omega-3 acid ethyl esters (LOVAZA) 1 g  capsule Take 1 g by mouth in the morning, at noon, in the evening, and at bedtime.    [provider]  pantoprazole (PROTONIX) 40 MG tablet Take 1 tablet (40 mg total) by mouth 2 (two) times daily before a meal. 01/02/20 04/01/20  Annitta Needs, NP  potassium chloride (KLOR-CON) 10 MEQ tablet Take 10 mEq by mouth daily.    [provider]  pravastatin (PRAVACHOL) 80 MG tablet Take 1 tablet (80 mg total) by mouth at bedtime. 09/16/17   Kayleen Memos, DO  predniSONE (DELTASONE) 10 MG tablet Take 4 tablets (40 mg total) by mouth 2 (two) times daily with a meal for 3 days, THEN 3 tablets (30 mg total) 2 (two) times daily with a meal for 3  days, THEN 2 tablets (20 mg total) 2 (two) times daily with a meal for 3 days, THEN 1 tablet (10 mg total) 2 (two) times daily with a meal for 3 days. 03/17/20 03/29/20  Manuella Ghazi, Pratik D, DO  torsemide (DEMADEX) 20 MG tablet Take 2 tablets (40 mg total) by mouth daily. Patient taking differently: Take 10-20 mg by mouth 2 (two) times daily as needed (swelling). 07/02/15   Samuella Cota, MD  traMADol (ULTRAM) 50 MG tablet Take 50 mg by mouth 3 (three) times daily as needed for moderate pain.  01/07/18   [provider]  traZODone (DESYREL) 50 MG tablet Take 50 mg by mouth at bedtime.    [provider]  vitamin B-12 (CYANOCOBALAMIN) 1000 MCG tablet Take 1,000 mcg by mouth daily.    [provider]  vitamin E 200 UNIT capsule Take 200 Units by mouth daily.     [provider]  XULTOPHY 100-3.6 UNIT-MG/ML SOPN Inject 80 Units as directed at bedtime. 12/28/17   [provider]     Signature:  Chesley Mires, MD [place] Pager - [phone] 03/18/2020, 2:25 PM

## 2020-03-18 NOTE — Plan of Care (Signed)
  Problem: Pain Managment: Goal: General experience of comfort will improve Outcome: Progressing   Problem: Safety: Goal: Ability to remain free from injury will improve Outcome: Progressing   Problem: Skin Integrity: Goal: Risk for impaired skin integrity will decrease Outcome: Progressing   

## 2020-03-18 NOTE — Progress Notes (Signed)
PROGRESS NOTE    KOURTNEI RAUBER  TFT:[id number] DOB: 1950-10-08 DOA: 03/17/2020 PCP: [name], FNP   Brief Narrative:  Per HPI: Chelesa Weingartner Junio is a 70 y.o. female with medical history significant of Covid pneumonia just discharged today to home from the hospital after completing remdesivir on steroids.  Patient normally is on 2 L of oxygen and was discharged on 6 L with exertion but at rest was only requiring 4 L.  She was transferred by ambulance home by the time she got home she was extremely short of breath and hypoxic.  She is currently on 15 L high flow nasal cannula.  Sats are in the low 80s on 8 9 L.  She does have underlying COPD.  Patient be referred for admission for hypoxia.  -Patient was discharged on 1/30 readmitted overnight for worsening dyspnea and hypoxemia related to COVID-19 pneumonia.  Assessment & Plan:   Principal Problem:   Acute respiratory disease due to COVID-19 virus Active Problems:   Hypertension   COPD (chronic obstructive pulmonary disease) (HCC)   MGUS (monoclonal gammopathy of unknown significance)   Pneumonia due to COVID-19 virus   COVID-19   Acute hypoxemic respiratory failure secondary to COVID-19 pneumonia -Continue IV Solu-Medrol 1 mg/kg bid as ordered -Completed recent course of remdesivir -Procalcitonin low, avoid antibiotics -Breathing treatments as needed -Continue to trend inflammatory markers -Appreciate pulmonology consultation  Acute COPD exacerbation related to above -IV steroids and breathing treatments as noted above  Type 2 diabetes with hyperglycemia -Restarted on SSI, will add Tradjenta and Levemir -Recent A1c of 9.2%  CKD stage III with possible mild AKI -Continue to closely monitoring -Follow labs -I/Os  Hypertension -Home torsemide resumed  Hypothyroidism -Continue Synthroid  Obesity -Lifestyle changes outpatient  DVT prophylaxis: Eliquis Code Status: Full Family Communication: Discussed with daughter on  phone 1/31 Disposition Plan:  Status is: Inpatient  Remains inpatient appropriate because:IV treatments appropriate due to intensity of illness or inability to take PO and Inpatient level of care appropriate due to severity of illness   Dispo: The patient is from: Home              Anticipated d/c is to: Home              Anticipated d/c date is: 2 days              Patient currently is not medically stable to d/c.   Difficult to place patient No   Consultants:   Pulmonology  Procedures:   See below  Antimicrobials:  Anti-infectives (From admission, onward)   Start     Dose/Rate Route Frequency Ordered Stop   03/17/20 2030  levofloxacin (LEVAQUIN) IVPB 750 mg        750 mg 100 mL/hr over 90 Minutes Intravenous  Once 03/17/20 2017 03/17/20 2209       Subjective: Patient seen and evaluated today with some chest tightness and headaches noted. She would like her home tramadol resumed for pain in her legs as well. No acute concerns or events noted overnight.  Objective: Vitals:   03/17/20 2345 03/18/20 0006 03/18/20 0430 03/18/20 0805  BP:  135/75 (!) 128/103   Pulse: 73 83 85   Resp: (!) 22     Temp:  97.7 F (36.5 C) 97.7 F (36.5 C)   TempSrc:  Oral Oral   SpO2: 100% 92% 99% 92%  Weight:   101.1 kg   Height:        Intake/Output  Summary (Last 24 hours) at 03/18/2020 0913 Last data filed at 03/17/2020 2209 Gross per 24 hour  Intake 116.67 ml  Output --  Net 116.67 ml   Filed Weights   03/17/20 1902 03/18/20 0430  Weight: 103 kg 101.1 kg    Examination:  General exam: Appears calm and comfortable, obese Respiratory system: Diminished bilaterally. Respiratory effort normal.  Currently on 7 L nasal cannula oxygen Cardiovascular system: S1 & S2 heard, RRR.  Gastrointestinal system: Abdomen is soft Central nervous system: Alert and awake Extremities: No edema Skin: No significant lesions noted Psychiatry: Flat affect.    Data Reviewed: I have  personally reviewed following labs and imaging studies  CBC: Recent Labs  Lab 03/14/20 0636 03/15/20 0400 03/16/20 0716 03/17/20 0500 03/17/20 1906 03/18/20 0404  WBC 13.4* 11.8* 11.8* 15.2* 18.4* 15.9*  NEUTROABS 11.6* 9.7* 10.4*  --  16.6* 14.8*  HGB 12.3 12.2 13.8 13.8 14.2 13.9  HCT 39.5 39.3 43.6 43.0 44.2 43.9  MCV 78.8* 79.6* 78.3* 77.1* 76.6* 77.6*  PLT 410* 400 387 370 443* 801   Basic Metabolic Panel: Recent Labs  Lab 03/12/20 0657 03/13/20 0521 03/14/20 0636 03/15/20 0400 03/16/20 0716 03/17/20 0500 03/17/20 1906 03/18/20 0404  NA 133* 137 135 135 134* 131* 129* 131*  K 4.6 4.0 3.6 3.5 3.7 3.6 3.3* 3.5  CL 98 101 106 103 99 95* 93* 91*  CO2 24 25 23 23 27 26 24 26   GLUCOSE 429* 369* 152* 183* 240* 189* 280* 324*  BUN 51* 48* 44* 45* 50* 58* 66* 69*  CREATININE 1.35* 1.25* 1.06* 1.18* 1.39* 1.43* 1.78* 1.60*  CALCIUM 8.4* 8.4* 8.1* 8.1* 8.1* 8.3* 8.4* 8.3*  MG 2.5* 2.4 2.2 2.1 1.9 1.8  --   --   PHOS 3.0 3.0 3.4 3.4 4.4  --   --   --    GFR: Estimated Creatinine Clearance: 41.3 mL/min (A) (by C-G formula based on SCr of 1.6 mg/dL (H)). Liver Function Tests: Recent Labs  Lab 03/15/20 0400 03/16/20 0716 03/17/20 0500 03/17/20 1906 03/18/20 0404  AST 59* 51* 43* 49* 35  ALT 52* 68* 64* 77* 69*  ALKPHOS 177* 223* 212* 263* 224*  BILITOT 0.4 0.6 0.5 0.6 0.6  PROT 6.5 7.2 6.8 7.8 7.1  ALBUMIN 2.0* 2.3* 2.2* 2.7* 2.4*   No results for input(s): LIPASE, AMYLASE in the last 168 hours. No results for input(s): AMMONIA in the last 168 hours. Coagulation Profile: No results for input(s): INR, PROTIME in the last 168 hours. Cardiac Enzymes: No results for input(s): CKTOTAL, CKMB, CKMBINDEX, TROPONINI in the last 168 hours. BNP (last 3 results) No results for input(s): PROBNP in the last 8760 hours. HbA1C: No results for input(s): HGBA1C in the last 72 hours. CBG: Recent Labs  Lab 03/16/20 2024 03/17/20 0344 03/17/20 0743 03/17/20 1113  03/17/20 2149  GLUCAP 394* 188* 184* 312* 299*   Lipid Profile: No results for input(s): CHOL, HDL, LDLCALC, TRIG, CHOLHDL, LDLDIRECT in the last 72 hours. Thyroid Function Tests: No results for input(s): TSH, T4TOTAL, FREET4, T3FREE, THYROIDAB in the last 72 hours. Anemia Panel: Recent Labs    03/16/20 0716  FERRITIN 591*   Sepsis Labs: Recent Labs  Lab 03/11/20 1353 03/11/20 1435 03/11/20 1819 03/17/20 2017 03/17/20 2109 03/18/20 0052 03/18/20 0404  PROCALCITON 0.79  --   --   --  0.16  --   --   LATICACIDVEN  --    < > 2.0* 3.7*  --  2.8*  2.2*   < > = values in this interval not displayed.    Recent Results (from the past 240 hour(s))  Blood Culture (routine x 2)     Status: None   Collection Time: 03/11/20  1:45 PM   Specimen: BLOOD  Result Value Ref Range Status   Specimen Description BLOOD  Final   Special Requests NONE  Final   Culture   Final    NO GROWTH 5 DAYS Performed at [facility], [address], [place], [place] [address]    Report Status 03/16/2020 FINAL  Final  SARS Coronavirus 2 by RT PCR (hospital order, performed in [facility] hospital lab) Nasopharyngeal Nasopharyngeal Swab     Status: Abnormal   Collection Time: 03/11/20  1:47 PM   Specimen: Nasopharyngeal Swab  Result Value Ref Range Status   SARS Coronavirus 2 POSITIVE (A) NEGATIVE Final    Comment: RESULT CALLED TO, READ BACK BY AND VERIFIED WITH: MYRICK,B AT 1515 ON 03/11/20 BY HUFFINES,S. (NOTE) SARS-CoV-2 target nucleic acids are DETECTED  SARS-CoV-2 RNA is generally detectable in upper respiratory specimens  during the acute phase of infection.  Positive results are indicative  of the presence of the identified virus, but do not rule out bacterial infection or co-infection with other pathogens not detected by the test.  Clinical correlation with patient history and  other diagnostic information is necessary to determine patient infection status.  The expected result is  negative.  Fact Sheet for Patients:   StrictlyIdeas.no   Fact Sheet for Healthcare Providers:   BankingDealers.co.za    This test is not yet approved or cleared by the [place] FDA and  has been authorized for detection and/or diagnosis of SARS-CoV-2 by FDA under an Emergency Use Authorization (EUA).  This EUA will remain in effect (meanin g this test can be used) for the duration of  the COVID-19 declaration under Section 564(b)(1) of the Act, 21 U.S.C. section 360-bbb-3(b)(1), unless the authorization is terminated or revoked sooner.  Performed at [facility], [address], [place], [place] [address]   Blood Culture (routine x 2)     Status: None   Collection Time: 03/11/20  1:58 PM   Specimen: BLOOD  Result Value Ref Range Status   Specimen Description BLOOD LEFT ARM  Final   Special Requests   Final    BOTTLES DRAWN AEROBIC AND ANAEROBIC Blood Culture adequate volume   Culture   Final    NO GROWTH 5 DAYS Performed at [facility], [address] [place], [place] [address]    Report Status 03/16/2020 FINAL  Final  Culture, blood (Routine X 2) w Reflex to ID Panel     Status: None (Preliminary result)   Collection Time: 03/17/20  9:00 PM   Specimen: BLOOD LEFT FOREARM  Result Value Ref Range Status   Specimen Description BLOOD LEFT FOREARM  Final   Special Requests   Final    BOTTLES DRAWN AEROBIC AND ANAEROBIC Blood Culture adequate volume   Culture   Final    NO GROWTH < 12 HOURS Performed at [facility], [address], [place], [place] [address]    Report Status PENDING  Incomplete  Culture, blood (Routine X 2) w Reflex to ID Panel     Status: None (Preliminary result)   Collection Time: 03/17/20  9:00 PM   Specimen: BLOOD RIGHT FOREARM  Result Value Ref Range Status   Specimen Description BLOOD RIGHT FOREARM  Final   Special Requests  Final    BOTTLES DRAWN AEROBIC AND ANAEROBIC Blood Culture adequate volume    Culture   Final    NO GROWTH < 12 HOURS Performed at [facility], [address] [place], [place] [address]    Report Status PENDING  Incomplete         Radiology Studies: DG Chest Port 1 View  Result Date: 03/17/2020 CLINICAL DATA:  Chest pain and cough with history of COVID-19 positivity EXAM: PORTABLE CHEST 1 VIEW COMPARISON:  03/15/2020 FINDINGS: Cardiac shadow is stable. Persistent and slightly increased airspace opacity is noted bilaterally consistent with given clinical history. No sizable effusion is noted. No bony abnormality is seen. IMPRESSION: Increased airspace opacity bilaterally consistent with the given clinical history of COVID-19 positivity. Electronically Signed   By: Inez Catalina M.D.   On: 03/17/2020 19:41   ECHOCARDIOGRAM LIMITED  Result Date: 03/16/2020    ECHOCARDIOGRAM LIMITED REPORT   Patient Name:   DENIM START Elsbernd Date of Exam: 03/16/2020 Medical Rec #:  [id number]   Height:       68.0 in Accession #:    [id number]  Weight:       227.3 lb Date of Birth:  Jan 04, 1951   BSA:          2.158 m Patient Age:    89 years    BP:           156/81 mmHg Patient Gender: F           HR:           73 bpm. Exam Location:  [facility] Procedure: Limited Echo, Cardiac Doppler and Color Doppler Indications:    Dyspnea  History:        Patient has prior history of Echocardiogram examinations, most                 recent 09/16/2017. COPD, Signs/Symptoms:Dyspnea; Risk                 Factors:Hypertension, Diabetes and Obesity. COVID+.  Sonographer:    Dustin Flock RDCS Referring Phys: [id number] Royanne Foots [facility]  Sonographer Comments: COVID+ IMPRESSIONS  1. Left ventricular ejection fraction, by estimation, is 55 to 60%. The left ventricle has normal function. The left ventricle has no regional wall motion abnormalities. Left ventricular diastolic parameters are consistent with Grade I diastolic dysfunction (impaired relaxation).  2. Right ventricular systolic function is normal. The right  ventricular size is normal.  3. The mitral valve is normal in structure. No evidence of mitral valve regurgitation. No evidence of mitral stenosis.  4. The aortic valve is normal in structure. Aortic valve regurgitation is not visualized. No aortic stenosis is present.  5. The inferior vena cava is normal in size with greater than 50% respiratory variability, suggesting right atrial pressure of 3 mmHg. FINDINGS  Left Ventricle: Left ventricular ejection fraction, by estimation, is 55 to 60%. The left ventricle has normal function. The left ventricle has no regional wall motion abnormalities. The left ventricular internal cavity size was normal in size. There is  no left ventricular hypertrophy. Left ventricular diastolic parameters are consistent with Grade I diastolic dysfunction (impaired relaxation). Indeterminate filling pressures. Right Ventricle: The right ventricular size is normal. No increase in right ventricular wall thickness. Right ventricular systolic function is normal. Left Atrium: Left atrial size was normal in size. Right Atrium: Right atrial size was normal in size. Pericardium: There is no evidence of pericardial effusion. Mitral Valve: The mitral valve is normal  in structure. No evidence of mitral valve stenosis. Tricuspid Valve: The tricuspid valve is normal in structure. Tricuspid valve regurgitation is not demonstrated. No evidence of tricuspid stenosis. Aortic Valve: The aortic valve is normal in structure. Aortic valve regurgitation is not visualized. No aortic stenosis is present. Pulmonic Valve: The pulmonic valve was grossly normal. Pulmonic valve regurgitation is not visualized. No evidence of pulmonic stenosis. Aorta: The aortic root is normal in size and structure. Venous: The inferior vena cava is normal in size with greater than 50% respiratory variability, suggesting right atrial pressure of 3 mmHg. IAS/Shunts: No atrial level shunt detected by color flow Doppler. LEFT VENTRICLE  PLAX 2D LVIDd:         3.75 cm  Diastology LVIDs:         2.76 cm  LV e' medial:    6.42 cm/s LV PW:         1.14 cm  LV E/e' medial:  13.5 LV IVS:        1.17 cm  LV e' lateral:   7.72 cm/s LVOT diam:     2.00 cm  LV E/e' lateral: 11.2 LVOT Area:     3.14 cm  RIGHT VENTRICLE RV S prime:     10.30 cm/s LEFT ATRIUM         Index LA diam:    3.30 cm 1.53 cm/m   AORTA Ao Root diam: 2.90 cm MITRAL VALVE MV Area (PHT): 3.63 cm    SHUNTS MV Decel Time: 209 msec    Systemic Diam: 2.00 cm MV E velocity: 86.50 cm/s MV A velocity: 94.70 cm/s MV E/A ratio:  0.91 Mihai Croitoru MD Electronically signed by Sanda Klein MD Signature Date/Time: 03/16/2020/1:51:47 PM    Final         Scheduled Meds: . apixaban  5 mg Oral BID  . vitamin C  1,000 mg Oral Daily  . cholecalciferol  4,000 Units Oral Daily  . insulin aspart  0-20 Units Subcutaneous TID WC  . insulin aspart  0-5 Units Subcutaneous QHS  . insulin aspart  4 Units Subcutaneous TID WC  . Ipratropium-Albuterol  1 puff Inhalation Q6H  . levothyroxine  88 mcg Oral QAC breakfast  . loratadine  10 mg Oral Daily  . methylPREDNISolone (SOLU-MEDROL) injection  100 mg Intravenous Q12H  . metoprolol tartrate  25 mg Oral BID  . pantoprazole  40 mg Oral BID AC  . pravastatin  80 mg Oral QHS  . pregabalin  75 mg Oral BID  . sodium chloride flush  3 mL Intravenous Q12H  . torsemide  40 mg Oral Daily  . traZODone  50 mg Oral QHS   Continuous Infusions: . sodium chloride       LOS: 1 day    Time spent: 35 minutes    Garret Teale Darleen Crocker, DO [facility]  If 7PM-7AM, please contact night-coverage [email or web address] 03/18/2020, 9:13 AM

## 2020-03-18 NOTE — Plan of Care (Signed)

## 2020-03-18 NOTE — Progress Notes (Signed)
Patient is asleep , combivent placed in room, Patient on 10 liter hfnc, IS and flutter valve placed in room. No resp problems noted.

## 2020-03-18 NOTE — Progress Notes (Signed)
Inpatient Diabetes Program Recommendations  AACE/ADA: New Consensus Statement on Inpatient Glycemic Control   Target Ranges:  Prepandial:   less than 140 mg/dL      Peak postprandial:   less than 180 mg/dL (1-2 hours)      Critically ill patients:  140 - 180 mg/dL  Results for MAHRUKH, SEGUIN (MRN [id number]) as of 03/18/2020 11:19  Ref. Range 03/18/2020 04:04  Glucose Latest Ref Range: 70 - 99 mg/dL 324 (H)   Results for CASSY, SPROWL (MRN [id number]) as of 03/18/2020 11:19  Ref. Range 03/17/2020 03:44 03/17/2020 07:43 03/17/2020 11:13 03/17/2020 21:49  Glucose-Capillary Latest Ref Range: 70 - 99 mg/dL 188 (H) 184 (H) 312 (H) 299 (H)   Review of Glycemic Control  Diabetes history: DM2 Outpatient Diabetes medications: Xultophy 100-3.6 80 units QHS, Humalog 45-47 units with breakfast and supper, Humalog 30-32 units with lunch Current orders for Inpatient glycemic control: Novolog 0-20 units TID with meals, Novolog 0-5 units QHS, Novolog 4 units TID with meals; Solumedrol 100 mg Q12H  Inpatient Diabetes Program Recommendations:    Insulin: If steroids are continued, please consider ordering Levemir 40 units BID and increasing meal coverage to Novolog 12 units TID with meals if patient eats at least 50% of meals.  NOTE: Patient was recently inpatient 03/11/20-03/17/20 and was ordered Levemir 38 units BID, Novolog 18 units TID with meals, Novolog 0-20 units Q4H, and Solumedrol 50 mg Q12H (per progress note on 03/16/20). Patient was readmitted on 03/17/20 and lab glucose 324 mg/dl this morning. Patient is not currently ordered any basal insulin.   Thanks, Barnie Alderman, RN, MSN, CDE Diabetes Coordinator Inpatient Diabetes Program [phone] (Team Pager from 8am to 5pm)

## 2020-03-19 DIAGNOSIS — J069 Acute upper respiratory infection, unspecified: Secondary | ICD-10-CM | POA: Diagnosis not present

## 2020-03-19 DIAGNOSIS — U071 COVID-19: Secondary | ICD-10-CM | POA: Diagnosis not present

## 2020-03-19 LAB — COMPREHENSIVE METABOLIC PANEL
ALT: 83 U/L — ABNORMAL HIGH (ref 0–44)
AST: 55 U/L — ABNORMAL HIGH (ref 15–41)
Albumin: 2.4 g/dL — ABNORMAL LOW (ref 3.5–5.0)
Alkaline Phosphatase: 250 U/L — ABNORMAL HIGH (ref 38–126)
Anion gap: 13 (ref 5–15)
BUN: 86 mg/dL — ABNORMAL HIGH (ref 8–23)
CO2: 26 mmol/L (ref 22–32)
Calcium: 8.5 mg/dL — ABNORMAL LOW (ref 8.9–10.3)
Chloride: 92 mmol/L — ABNORMAL LOW (ref 98–111)
Creatinine, Ser: 1.75 mg/dL — ABNORMAL HIGH (ref 0.44–1.00)
GFR, Estimated: 31 mL/min — ABNORMAL LOW (ref >60)
Glucose, Bld: 216 mg/dL — ABNORMAL HIGH (ref 70–99)
Potassium: 2.8 mmol/L — ABNORMAL LOW (ref 3.5–5.1)
Sodium: 131 mmol/L — ABNORMAL LOW (ref 135–145)
Total Bilirubin: 0.5 mg/dL (ref 0.3–1.2)
Total Protein: 6.6 g/dL (ref 6.5–8.1)

## 2020-03-19 LAB — GLUCOSE, CAPILLARY
Glucose-Capillary: 141 mg/dL — ABNORMAL HIGH (ref 70–99)
Glucose-Capillary: 143 mg/dL — ABNORMAL HIGH (ref 70–99)
Glucose-Capillary: 237 mg/dL — ABNORMAL HIGH (ref 70–99)
Glucose-Capillary: 307 mg/dL — ABNORMAL HIGH (ref 70–99)
Glucose-Capillary: 368 mg/dL — ABNORMAL HIGH (ref 70–99)
Glucose-Capillary: 372 mg/dL — ABNORMAL HIGH (ref 70–99)

## 2020-03-19 LAB — CBC WITH DIFFERENTIAL/PLATELET
Abs Immature Granulocytes: 0.11 10*3/uL — ABNORMAL HIGH (ref 0.00–0.07)
Basophils Absolute: 0 10*3/uL (ref 0.0–0.1)
Basophils Relative: 0 %
Eosinophils Absolute: 0 10*3/uL (ref 0.0–0.5)
Eosinophils Relative: 0 %
HCT: 41.6 % (ref 36.0–46.0)
Hemoglobin: 13.5 g/dL (ref 12.0–15.0)
Immature Granulocytes: 1 %
Lymphocytes Relative: 7 %
Lymphs Abs: 1.2 10*3/uL (ref 0.7–4.0)
MCH: 25.2 pg — ABNORMAL LOW (ref 26.0–34.0)
MCHC: 32.5 g/dL (ref 30.0–36.0)
MCV: 77.6 fL — ABNORMAL LOW (ref 80.0–100.0)
Monocytes Absolute: 1.7 10*3/uL — ABNORMAL HIGH (ref 0.1–1.0)
Monocytes Relative: 10 %
Neutro Abs: 13.2 10*3/uL — ABNORMAL HIGH (ref 1.7–7.7)
Neutrophils Relative %: 82 %
Platelets: 373 10*3/uL (ref 150–400)
RBC: 5.36 MIL/uL — ABNORMAL HIGH (ref 3.87–5.11)
RDW: 16.8 % — ABNORMAL HIGH (ref 11.5–15.5)
WBC: 16.2 10*3/uL — ABNORMAL HIGH (ref 4.0–10.5)
nRBC: 0 % (ref 0.0–0.2)

## 2020-03-19 LAB — D-DIMER, QUANTITATIVE: D-Dimer, Quant: 0.85 ug/mL-FEU — ABNORMAL HIGH (ref 0.00–0.50)

## 2020-03-19 LAB — C-REACTIVE PROTEIN: CRP: 1.7 mg/dL — ABNORMAL HIGH (ref <1.0)

## 2020-03-19 LAB — MAGNESIUM: Magnesium: 2.1 mg/dL (ref 1.7–2.4)

## 2020-03-19 LAB — FERRITIN: Ferritin: 922 ng/mL — ABNORMAL HIGH (ref 11–307)

## 2020-03-19 MED ORDER — SODIUM CHLORIDE 0.9 % IV SOLN: INTRAVENOUS | Status: AC

## 2020-03-19 MED ORDER — INSULIN ASPART 100 UNIT/ML ~~LOC~~ SOLN
18.0000 [IU] | Freq: Three times a day (TID) | SUBCUTANEOUS | Status: DC
  Administered 2020-03-19 – 2020-03-20 (×5): 18 [IU] via SUBCUTANEOUS

## 2020-03-19 MED ORDER — POTASSIUM CHLORIDE CRYS ER 20 MEQ PO TBCR
40.0000 meq | EXTENDED_RELEASE_TABLET | Freq: Three times a day (TID) | ORAL | Status: AC
  Administered 2020-03-19 – 2020-03-20 (×3): 40 meq via ORAL
  Filled 2020-03-19 (×3): qty 2

## 2020-03-19 MED ORDER — INSULIN DETEMIR 100 UNIT/ML ~~LOC~~ SOLN
45.0000 [IU] | Freq: Two times a day (BID) | SUBCUTANEOUS | Status: DC
  Administered 2020-03-19 – 2020-03-20 (×2): 45 [IU] via SUBCUTANEOUS
  Filled 2020-03-19 (×4): qty 0.45

## 2020-03-19 NOTE — Progress Notes (Signed)
Pt was ambulated from bed to chair, Pt has been ambulating to [facility]. Complains of dyspenia upon exertion.

## 2020-03-19 NOTE — Progress Notes (Signed)
PROGRESS NOTE    Stephanie ERGLE  AGT:[id number] DOB: 04-27-1950 DOA: 03/17/2020 PCP: [name], FNP   Brief Narrative:  Stephanie [name] a 70 y.o.femalewith medical history significant ofCovid pneumonia just discharged today to home from the hospital after completing remdesivir on steroids. Patient normally is on 2 L of oxygen and was discharged on 6 L with exertion but at rest was only requiring 4 L. She was transferred by ambulance home by the time she got home she was extremely short of breath and hypoxic.  Patient was readmitted for acute hypoxemic respiratory failure in the setting of Covid pneumonia.  Pulmonology following.  Assessment & Plan:   Principal Problem:   Acute respiratory disease due to COVID-19 virus Active Problems:   Hypertension   COPD (chronic obstructive pulmonary disease) (HCC)   MGUS (monoclonal gammopathy of unknown significance)   Pneumonia due to COVID-19 virus   COVID-19   Acute hypoxemic respiratory failure secondary to COVID-19 pneumonia -Decrease IV Solu-Medrol from 1 mg/kg bid to 0.47m/kg given steroid induced hyperglycemia on 1/31 -Completed recent course of remdesivir on prior admission -Procalcitonin low, avoiding antibiotics -Breathing treatments as needed -Continue to trend inflammatory markers -Appreciate ongoing pulmonology consultation  Acute COPD exacerbation related to above -IV steroids and breathing treatments as noted above  Hypokalemia -Replete and re-evaluate in am -Hold torsemide  Type 2 diabetes with hyperglycemia-improved -Restarted on SSI, will add Tradjenta and Levemir -Recent A1c of 9.2%  CKD stage IIIa with AKI -Hold torsemide today and start gentle IVF for 12 hours -Continue to closely monitoring -Follow labs -I/Os  Hypertension-stable -Home torsemide held for now  Hypothyroidism -Continue Synthroid  Obesity -Lifestyle changes outpatient  DVT prophylaxis: Eliquis Code Status: Full Family  Communication: Discussed with daughter on phone 2/1 Disposition Plan:  Status is: Inpatient  Remains inpatient appropriate because:IV treatments appropriate due to intensity of illness or inability to take PO and Inpatient level of care appropriate due to severity of illness   Dispo: The patient is from: Home  Anticipated d/c is to: Home  Anticipated d/c date is: 2-3 days  Patient currently is not medically stable to d/c.              Difficult to place patient No   Consultants:   Pulmonology  Procedures:   See below   Nutritional Assessment:  The patient's BMI is: Body mass index is 33.89 kg/m..Marland Kitchen Antimicrobials:  Anti-infectives (From admission, onward)   Start     Dose/Rate Route Frequency Ordered Stop   03/17/20 2030  levofloxacin (LEVAQUIN) IVPB 750 mg        750 mg 100 mL/hr over 90 Minutes Intravenous  Once 03/17/20 2017 03/17/20 2209       Subjective: Patient seen and evaluated today with no new acute complaints or concerns.  Patient was noted to have worsening hyperglycemia yesterday evening which has been corrected.  She continues to remain short of breath with any amount of exertion.  Objective: Vitals:   03/18/20 1500 03/18/20 2015 03/18/20 2045 03/19/20 0910  BP: 129/83 129/75    Pulse: 89 87 85   Resp: 18 18 20    Temp: 98.2 F (36.8 C) 97.7 F (36.5 C)    TempSrc: Oral Oral    SpO2: 94% 92% 94% 93%  Weight:      Height:        Intake/Output Summary (Last 24 hours) at 03/19/2020 1123 Last data filed at 03/18/2020 1700 Gross per 24 hour  Intake 480  ml  Output 2 ml  Net 478 ml   Filed Weights   03/17/20 1902 03/18/20 0430  Weight: 103 kg 101.1 kg    Examination:  General exam: Appears calm and comfortable  Respiratory system: Clear to auscultation. Respiratory effort normal.  Currently on 7 L nasal cannula oxygen. Cardiovascular system: S1 & S2 heard, RRR.  Gastrointestinal system: Abdomen is  soft Central nervous system: Alert and awake Extremities: No edema Skin: No significant lesions noted Psychiatry: Flat affect.    Data Reviewed: I have personally reviewed following labs and imaging studies  CBC: Recent Labs  Lab 03/15/20 0400 03/16/20 0716 03/17/20 0500 03/17/20 1906 03/18/20 0404 03/19/20 0453  WBC 11.8* 11.8* 15.2* 18.4* 15.9* 16.2*  NEUTROABS 9.7* 10.4*  --  16.6* 14.8* 13.2*  HGB 12.2 13.8 13.8 14.2 13.9 13.5  HCT 39.3 43.6 43.0 44.2 43.9 41.6  MCV 79.6* 78.3* 77.1* 76.6* 77.6* 77.6*  PLT 400 387 370 443* 379 161   Basic Metabolic Panel: Recent Labs  Lab 03/13/20 0521 03/14/20 0636 03/15/20 0400 03/16/20 0716 03/17/20 0500 03/17/20 1906 03/18/20 0404 03/18/20 1355 03/19/20 0453  NA 137 135 135 134* 131* 129* 131*  --  131*  K 4.0 3.6 3.5 3.7 3.6 3.3* 3.5  --  2.8*  CL 101 106 103 99 95* 93* 91*  --  92*  CO2 25 23 23 27 26 24 26   --  26  GLUCOSE 369* 152* 183* 240* 189* 280* 324* 596* 216*  BUN 48* 44* 45* 50* 58* 66* 69*  --  86*  CREATININE 1.25* 1.06* 1.18* 1.39* 1.43* 1.78* 1.60*  --  1.75*  CALCIUM 8.4* 8.1* 8.1* 8.1* 8.3* 8.4* 8.3*  --  8.5*  MG 2.4 2.2 2.1 1.9 1.8  --   --   --  2.1  PHOS 3.0 3.4 3.4 4.4  --   --   --   --   --    GFR: Estimated Creatinine Clearance: 37.7 mL/min (A) (by C-G formula based on SCr of 1.75 mg/dL (H)). Liver Function Tests: Recent Labs  Lab 03/16/20 0716 03/17/20 0500 03/17/20 1906 03/18/20 0404 03/19/20 0453  AST 51* 43* 49* 35 55*  ALT 68* 64* 77* 69* 83*  ALKPHOS 223* 212* 263* 224* 250*  BILITOT 0.6 0.5 0.6 0.6 0.5  PROT 7.2 6.8 7.8 7.1 6.6  ALBUMIN 2.3* 2.2* 2.7* 2.4* 2.4*   No results for input(s): LIPASE, AMYLASE in the last 168 hours. No results for input(s): AMMONIA in the last 168 hours. Coagulation Profile: No results for input(s): INR, PROTIME in the last 168 hours. Cardiac Enzymes: No results for input(s): CKTOTAL, CKMB, CKMBINDEX, TROPONINI in the last 168 hours. BNP (last 3  results) No results for input(s): PROBNP in the last 8760 hours. HbA1C: No results for input(s): HGBA1C in the last 72 hours. CBG: Recent Labs  Lab 03/18/20 2220 03/19/20 0004 03/19/20 0406 03/19/20 0750 03/19/20 1057  GLUCAP 415* 368* 237* 141* 143*   Lipid Profile: No results for input(s): CHOL, HDL, LDLCALC, TRIG, CHOLHDL, LDLDIRECT in the last 72 hours. Thyroid Function Tests: No results for input(s): TSH, T4TOTAL, FREET4, T3FREE, THYROIDAB in the last 72 hours. Anemia Panel: Recent Labs    03/19/20 0453  FERRITIN 922*   Sepsis Labs: Recent Labs  Lab 03/17/20 2017 03/17/20 2109 03/18/20 0052 03/18/20 0404  PROCALCITON  --  0.16  --   --   LATICACIDVEN 3.7*  --  2.8* 2.2*    Recent Results (from  the past 240 hour(s))  Blood Culture (routine x 2)     Status: None   Collection Time: 03/11/20  1:45 PM   Specimen: BLOOD  Result Value Ref Range Status   Specimen Description BLOOD  Final   Special Requests NONE  Final   Culture   Final    NO GROWTH 5 DAYS Performed at [facility], [address], [place], [place] [address]    Report Status 03/16/2020 FINAL  Final  SARS Coronavirus 2 by RT PCR (hospital order, performed in [place] hospital lab) Nasopharyngeal Nasopharyngeal Swab     Status: Abnormal   Collection Time: 03/11/20  1:47 PM   Specimen: Nasopharyngeal Swab  Result Value Ref Range Status   SARS Coronavirus 2 POSITIVE (A) NEGATIVE Final    Comment: RESULT CALLED TO, READ BACK BY AND VERIFIED WITH: MYRICK,B AT 1515 ON 03/11/20 BY HUFFINES,S. (NOTE) SARS-CoV-2 target nucleic acids are DETECTED  SARS-CoV-2 RNA is generally detectable in upper respiratory specimens  during the acute phase of infection.  Positive results are indicative  of the presence of the identified virus, but do not rule out bacterial infection or co-infection with other pathogens not detected by the test.  Clinical correlation with patient history and  other diagnostic  information is necessary to determine patient infection status.  The expected result is negative.  Fact Sheet for Patients:   StrictlyIdeas.no   Fact Sheet for Healthcare Providers:   BankingDealers.co.za    This test is not yet approved or cleared by the [place] FDA and  has been authorized for detection and/or diagnosis of SARS-CoV-2 by FDA under an Emergency Use Authorization (EUA).  This EUA will remain in effect (meanin g this test can be used) for the duration of  the COVID-19 declaration under Section 564(b)(1) of the Act, 21 U.S.C. section 360-bbb-3(b)(1), unless the authorization is terminated or revoked sooner.  Performed at [facility], [address] [place], [place] [address]   Blood Culture (routine x 2)     Status: None   Collection Time: 03/11/20  1:58 PM   Specimen: BLOOD  Result Value Ref Range Status   Specimen Description BLOOD LEFT ARM  Final   Special Requests   Final    BOTTLES DRAWN AEROBIC AND ANAEROBIC Blood Culture adequate volume   Culture   Final    NO GROWTH 5 DAYS Performed at [facility], [address] [place], [place] [address]    Report Status 03/16/2020 FINAL  Final  Culture, blood (Routine X 2) w Reflex to ID Panel     Status: None (Preliminary result)   Collection Time: 03/17/20  9:00 PM   Specimen: BLOOD LEFT FOREARM  Result Value Ref Range Status   Specimen Description BLOOD LEFT FOREARM  Final   Special Requests   Final    BOTTLES DRAWN AEROBIC AND ANAEROBIC Blood Culture adequate volume   Culture   Final    NO GROWTH 2 DAYS Performed at [facility], [address], [place], [place] [address]    Report Status PENDING  Incomplete  Culture, blood (Routine X 2) w Reflex to ID Panel     Status: None (Preliminary result)   Collection Time: 03/17/20  9:00 PM   Specimen: BLOOD RIGHT FOREARM  Result Value Ref Range Status   Specimen Description BLOOD RIGHT FOREARM  Final   Special  Requests   Final    BOTTLES DRAWN AEROBIC AND ANAEROBIC Blood Culture adequate volume   Culture  Final    NO GROWTH 2 DAYS Performed at [facility], [address] [place], [place] [address]    Report Status PENDING  Incomplete         Radiology Studies: DG Chest Port 1 View  Result Date: 03/17/2020 CLINICAL DATA:  Chest pain and cough with history of COVID-19 positivity EXAM: PORTABLE CHEST 1 VIEW COMPARISON:  03/15/2020 FINDINGS: Cardiac shadow is stable. Persistent and slightly increased airspace opacity is noted bilaterally consistent with given clinical history. No sizable effusion is noted. No bony abnormality is seen. IMPRESSION: Increased airspace opacity bilaterally consistent with the given clinical history of COVID-19 positivity. Electronically Signed   By: Inez Catalina M.D.   On: 03/17/2020 19:41        Scheduled Meds: . apixaban  5 mg Oral BID  . vitamin C  1,000 mg Oral Daily  . cholecalciferol  4,000 Units Oral Daily  . insulin aspart  0-20 Units Subcutaneous Q4H  . insulin aspart  18 Units Subcutaneous TID WC  . insulin detemir  45 Units Subcutaneous BID  . Ipratropium-Albuterol  1 puff Inhalation TID  . levothyroxine  88 mcg Oral QAC breakfast  . loratadine  10 mg Oral Daily  . methylPREDNISolone (SOLU-MEDROL) injection  50 mg Intravenous Q12H  . metoprolol tartrate  25 mg Oral BID  . pantoprazole  40 mg Oral BID AC  . potassium chloride  40 mEq Oral TID  . pravastatin  80 mg Oral QHS  . pregabalin  75 mg Oral BID  . sodium chloride flush  3 mL Intravenous Q12H  . torsemide  40 mg Oral Daily  . traZODone  50 mg Oral QHS   Continuous Infusions: . sodium chloride       LOS: 2 days    Time spent: 35 minutes    Sia Gabrielsen Darleen Crocker, DO [facility]  If 7PM-7AM, please contact night-coverage [email or web address] 03/19/2020, 11:23 AM

## 2020-03-19 NOTE — Progress Notes (Signed)
NAME:  Stephanie [name], MRN:  [id number], DOB:  Jun 26, 1950, LOS: 2 ADMISSION DATE:  03/17/2020, CONSULTATION DATE:  03/18/2020 REFERRING MD:  Dr. Manuella Ghazi, [facility], CHIEF COMPLAINT:  Short of breath   Brief History:  70 yo female former smoker presented to [facility] on 03/11/20 with fever and vomiting.  She previously received two doses of [place] vaccine, but hadn't received booster.  She was found to have COVID 19 pneumonia.  She was treated with remdesivir and steroids.  She was started on supplemental oxygen.  She was noted to have mildly elevated procalcitonin and started on antibiotics.  She was discharged home on 03/17/20 with supplemental oxygen.  After discharge she felt more short of breath and noted that her oxygen levels were low (SpO2 84% on 8 liters).  She came back to hospital and re-admitted.  PCCM asked to assist with respiratory therapy.  Past Medical History:  COPD, HTN, Hypothyroidism, DM type 2, DVT, CKD 3a  Significant Hospital Events:  1/30 readmit  Consults:    Procedures:    Significant Diagnostic Tests:  Echo 03/16/20 >> EF 55 to 60%, grade 1 DD  Micro Data:  COVID 1/24 >> positive  Antimicrobials:    Interim History / Subjective:  Feels breathing better.  On 5 liters [place].    Objective   Blood pressure 129/75, pulse 85, temperature 97.7 F (36.5 C), temperature source Oral, resp. rate 20, height 5' 8"  (1.727 m), weight 101.1 kg, SpO2 93 %.        Intake/Output Summary (Last 24 hours) at 03/19/2020 1503 Last data filed at 03/19/2020 1452 Gross per 24 hour  Intake 720 ml  Output -  Net 720 ml   Filed Weights   03/17/20 1902 03/18/20 0430  Weight: 103 kg 101.1 kg    Examination:  General - alert, sitting in chair Eyes - pupils reactive ENT - no sinus tenderness, no stridor Cardiac - regular rate/rhythm, no murmur Chest - decreased breath sounds b/l, no wheezing or rales Abdomen - soft, non tender, + bowel sounds Extremities - no cyanosis, clubbing, or  edema Skin - no rashes Neuro - normal strength, moves extremities, follows commands Psych - normal mood and behavior   Resolved Hospital Problem list     Assessment & Plan:   Acute hypoxic respiratory failure from COVID 19 pneumonia.   - goal SpO2 85 to 95% - bronchial hygiene, mobilize as able - continue solumedrol - CRP trending down >> defer tocilizumab or barcitinib for now - follow up CXR intermittently  Former smoker with reported Hx of COPD. - continue combivent  DM type 2 poorly controlled with steroid induced hyperglycemia. CKD 3a. Hx of DVT on eliquis. Hypothyroidism. Hypokalemia. Hx of Hypertension. Hx of Hyperlipidemia. - per primary team  Best practice (evaluated daily)  Diet: heart healthy, carb modified DVT prophylaxis: eliquis GI prophylaxis: protonix Mobility: as tolerated Disposition: progressive care Code Status: full code  Labs    CMP Latest Ref Rng & Units 03/19/2020 03/18/2020 03/18/2020  Glucose 70 - 99 mg/dL 216(H) 596(HH) 324(H)  BUN 8 - 23 mg/dL 86(H) - 69(H)  Creatinine 0.44 - 1.00 mg/dL 1.75(H) - 1.60(H)  Sodium 135 - 145 mmol/L 131(L) - 131(L)  Potassium 3.5 - 5.1 mmol/L 2.8(L) - 3.5  Chloride 98 - 111 mmol/L 92(L) - 91(L)  CO2 22 - 32 mmol/L 26 - 26  Calcium 8.9 - 10.3 mg/dL 8.5(L) - 8.3(L)  Total Protein 6.5 - 8.1 g/dL 6.6 - 7.1  Total  Bilirubin 0.3 - 1.2 mg/dL 0.5 - 0.6  Alkaline Phos 38 - 126 U/L 250(H) - 224(H)  AST 15 - 41 U/L 55(H) - 35  ALT 0 - 44 U/L 83(H) - 69(H)    CBC Latest Ref Rng & Units 03/19/2020 03/18/2020 03/17/2020  WBC 4.0 - 10.5 K/uL 16.2(H) 15.9(H) 18.4(H)  Hemoglobin 12.0 - 15.0 g/dL 13.5 13.9 14.2  Hematocrit 36.0 - 46.0 % 41.6 43.9 44.2  Platelets 150 - 400 K/uL 373 379 443(H)    CBG (last 3)  Recent Labs    03/19/20 0406 03/19/20 0750 03/19/20 1057  GLUCAP 237* 141* 143*    Signature:  Chesley Mires, MD [place] Pager - [phone] 03/19/2020, 3:03 PM

## 2020-03-20 DIAGNOSIS — I10 Essential (primary) hypertension: Secondary | ICD-10-CM | POA: Diagnosis not present

## 2020-03-20 DIAGNOSIS — N179 Acute kidney failure, unspecified: Secondary | ICD-10-CM

## 2020-03-20 DIAGNOSIS — J1282 Pneumonia due to coronavirus disease 2019: Secondary | ICD-10-CM | POA: Diagnosis not present

## 2020-03-20 DIAGNOSIS — J449 Chronic obstructive pulmonary disease, unspecified: Secondary | ICD-10-CM

## 2020-03-20 DIAGNOSIS — E871 Hypo-osmolality and hyponatremia: Secondary | ICD-10-CM

## 2020-03-20 DIAGNOSIS — E1165 Type 2 diabetes mellitus with hyperglycemia: Secondary | ICD-10-CM

## 2020-03-20 DIAGNOSIS — U071 COVID-19: Secondary | ICD-10-CM | POA: Diagnosis not present

## 2020-03-20 DIAGNOSIS — Z794 Long term (current) use of insulin: Secondary | ICD-10-CM

## 2020-03-20 LAB — GLUCOSE, CAPILLARY
Glucose-Capillary: 227 mg/dL — ABNORMAL HIGH (ref 70–99)
Glucose-Capillary: 260 mg/dL — ABNORMAL HIGH (ref 70–99)
Glucose-Capillary: 285 mg/dL — ABNORMAL HIGH (ref 70–99)
Glucose-Capillary: 320 mg/dL — ABNORMAL HIGH (ref 70–99)
Glucose-Capillary: 324 mg/dL — ABNORMAL HIGH (ref 70–99)
Glucose-Capillary: 334 mg/dL — ABNORMAL HIGH (ref 70–99)
Glucose-Capillary: 335 mg/dL — ABNORMAL HIGH (ref 70–99)

## 2020-03-20 LAB — COMPREHENSIVE METABOLIC PANEL
ALT: 97 U/L — ABNORMAL HIGH (ref 0–44)
AST: 55 U/L — ABNORMAL HIGH (ref 15–41)
Albumin: 2.3 g/dL — ABNORMAL LOW (ref 3.5–5.0)
Alkaline Phosphatase: 256 U/L — ABNORMAL HIGH (ref 38–126)
Anion gap: 12 (ref 5–15)
BUN: 85 mg/dL — ABNORMAL HIGH (ref 8–23)
CO2: 27 mmol/L (ref 22–32)
Calcium: 8.4 mg/dL — ABNORMAL LOW (ref 8.9–10.3)
Chloride: 95 mmol/L — ABNORMAL LOW (ref 98–111)
Creatinine, Ser: 1.73 mg/dL — ABNORMAL HIGH (ref 0.44–1.00)
GFR, Estimated: 32 mL/min — ABNORMAL LOW (ref >60)
Glucose, Bld: 286 mg/dL — ABNORMAL HIGH (ref 70–99)
Potassium: 3.7 mmol/L (ref 3.5–5.1)
Sodium: 134 mmol/L — ABNORMAL LOW (ref 135–145)
Total Bilirubin: 0.7 mg/dL (ref 0.3–1.2)
Total Protein: 6.8 g/dL (ref 6.5–8.1)

## 2020-03-20 LAB — CBC WITH DIFFERENTIAL/PLATELET
Abs Immature Granulocytes: 0.07 10*3/uL (ref 0.00–0.07)
Basophils Absolute: 0 10*3/uL (ref 0.0–0.1)
Basophils Relative: 0 %
Eosinophils Absolute: 0 10*3/uL (ref 0.0–0.5)
Eosinophils Relative: 0 %
HCT: 43 % (ref 36.0–46.0)
Hemoglobin: 13.6 g/dL (ref 12.0–15.0)
Immature Granulocytes: 1 %
Lymphocytes Relative: 7 %
Lymphs Abs: 0.7 10*3/uL (ref 0.7–4.0)
MCH: 24.8 pg — ABNORMAL LOW (ref 26.0–34.0)
MCHC: 31.6 g/dL (ref 30.0–36.0)
MCV: 78.3 fL — ABNORMAL LOW (ref 80.0–100.0)
Monocytes Absolute: 0.5 10*3/uL (ref 0.1–1.0)
Monocytes Relative: 5 %
Neutro Abs: 9.2 10*3/uL — ABNORMAL HIGH (ref 1.7–7.7)
Neutrophils Relative %: 87 %
Platelets: 377 10*3/uL (ref 150–400)
RBC: 5.49 MIL/uL — ABNORMAL HIGH (ref 3.87–5.11)
RDW: 16.9 % — ABNORMAL HIGH (ref 11.5–15.5)
WBC: 10.5 10*3/uL (ref 4.0–10.5)
nRBC: 0 % (ref 0.0–0.2)

## 2020-03-20 LAB — C-REACTIVE PROTEIN: CRP: 1.4 mg/dL — ABNORMAL HIGH (ref <1.0)

## 2020-03-20 LAB — D-DIMER, QUANTITATIVE: D-Dimer, Quant: 0.81 ug/mL-FEU — ABNORMAL HIGH (ref 0.00–0.50)

## 2020-03-20 LAB — FERRITIN: Ferritin: 723 ng/mL — ABNORMAL HIGH (ref 11–307)

## 2020-03-20 MED ORDER — METHYLPREDNISOLONE SODIUM SUCC 40 MG IJ SOLR
40.0000 mg | Freq: Two times a day (BID) | INTRAMUSCULAR | Status: DC
  Administered 2020-03-20 – 2020-03-22 (×4): 40 mg via INTRAVENOUS
  Filled 2020-03-20 (×4): qty 1

## 2020-03-20 MED ORDER — INSULIN DETEMIR 100 UNIT/ML ~~LOC~~ SOLN
50.0000 [IU] | Freq: Two times a day (BID) | SUBCUTANEOUS | Status: DC
  Administered 2020-03-20 – 2020-03-22 (×4): 50 [IU] via SUBCUTANEOUS
  Filled 2020-03-20 (×10): qty 0.5

## 2020-03-20 MED ORDER — INSULIN ASPART 100 UNIT/ML ~~LOC~~ SOLN
22.0000 [IU] | Freq: Three times a day (TID) | SUBCUTANEOUS | Status: DC
  Administered 2020-03-21 – 2020-03-22 (×5): 22 [IU] via SUBCUTANEOUS

## 2020-03-20 NOTE — Progress Notes (Signed)
NAME:  Stephanie [name], MRN:  [id number], DOB:  26-Mar-1950, LOS: 3 ADMISSION DATE:  03/17/2020, CONSULTATION DATE:  03/18/2020 REFERRING MD:  Dr. Manuella Ghazi, [facility], CHIEF COMPLAINT:  Short of breath   Brief History:  70 yo female former smoker presented to [facility] on 03/11/20 with fever and vomiting.  She previously received two doses of [place] vaccine, but hadn't received booster.  She was found to have COVID 19 pneumonia.  She was treated with remdesivir and steroids.  She was started on supplemental oxygen.  She was noted to have mildly elevated procalcitonin and started on antibiotics.  She was discharged home on 03/17/20 with supplemental oxygen.  After discharge she felt more short of breath and noted that her oxygen levels were low (SpO2 84% on 8 liters).  She came back to hospital and re-admitted.  PCCM asked to assist with respiratory therapy.  Past Medical History:  COPD, HTN, Hypothyroidism, DM type 2, DVT, CKD 3a  Significant Hospital Events:  1/30 readmit  Consults:    Procedures:    Significant Diagnostic Tests:  Echo 03/16/20 >> EF 55 to 60%, grade 1 DD  Micro Data:  COVID 1/24 >> positive  Antimicrobials:    Interim History / Subjective:  Still feels tight in her chest when walking, but breathing better when sitting.  Down to 3.5 to 4 liters oxygen.  Intermittent cough, but sputum coming up easier.  CRP down to 1.4 and D dimer down to 0.81.  Objective   Blood pressure 118/67, pulse 81, temperature 98.1 F (36.7 C), temperature source Oral, resp. rate 20, height 5' 8"  (1.727 m), weight 101.1 kg, SpO2 93 %.        Intake/Output Summary (Last 24 hours) at 03/20/2020 1539 Last data filed at 03/20/2020 0500 Gross per 24 hour  Intake 480 ml  Output 500 ml  Net -20 ml   Filed Weights   03/17/20 1902 03/18/20 0430  Weight: 103 kg 101.1 kg    Examination:  General - alert, sitting in chair Eyes - pupils reactive ENT - no sinus tenderness, no stridor Cardiac - regular  rate/rhythm, no murmur Chest - equal breath sounds b/l, no wheezing or rales Abdomen - soft, non tender, + bowel sounds Extremities - no cyanosis, clubbing, or edema Skin - no rashes Neuro - normal strength, moves extremities, follows commands Psych - normal mood and behavior   Resolved Hospital Problem list     Assessment & Plan:   Acute hypoxic respiratory failure from COVID 19 pneumonia.   - goal SpO2 85 to 95% - wean off solumedrol as tolerated - bronchial hygiene, mobilize as able - follow up CXR intermittently  Former smoker with reported Hx of COPD. - continue combivent  DM type 2 poorly controlled with steroid induced hyperglycemia. CKD 3a. Hx of DVT on eliquis. Hypothyroidism. Hypokalemia. Hx of Hypertension. Hx of Hyperlipidemia. - per primary team  PCCM will sign off.  Please call if additional help needed while she is in hospital.  Best practice (evaluated daily)  Diet: heart healthy, carb modified DVT prophylaxis: eliquis GI prophylaxis: protonix Mobility: as tolerated Disposition: progressive care Code Status: full code  Labs    CMP Latest Ref Rng & Units 03/20/2020 03/19/2020 03/18/2020  Glucose 70 - 99 mg/dL 286(H) 216(H) 596(HH)  BUN 8 - 23 mg/dL 85(H) 86(H) -  Creatinine 0.44 - 1.00 mg/dL 1.73(H) 1.75(H) -  Sodium 135 - 145 mmol/L 134(L) 131(L) -  Potassium 3.5 - 5.1 mmol/L 3.7 2.8(L) -  Chloride 98 - 111 mmol/L 95(L) 92(L) -  CO2 22 - 32 mmol/L 27 26 -  Calcium 8.9 - 10.3 mg/dL 8.4(L) 8.5(L) -  Total Protein 6.5 - 8.1 g/dL 6.8 6.6 -  Total Bilirubin 0.3 - 1.2 mg/dL 0.7 0.5 -  Alkaline Phos 38 - 126 U/L 256(H) 250(H) -  AST 15 - 41 U/L 55(H) 55(H) -  ALT 0 - 44 U/L 97(H) 83(H) -    CBC Latest Ref Rng & Units 03/20/2020 03/19/2020 03/18/2020  WBC 4.0 - 10.5 K/uL 10.5 16.2(H) 15.9(H)  Hemoglobin 12.0 - 15.0 g/dL 13.6 13.5 13.9  Hematocrit 36.0 - 46.0 % 43.0 41.6 43.9  Platelets 150 - 400 K/uL 377 373 379    CBG (last 3)  Recent Labs     03/20/20 0421 03/20/20 0807 03/20/20 1158  GLUCAP 260* 227* 324*    Signature:  Chesley Mires, MD [place] Pager - [phone] 03/20/2020, 3:39 PM

## 2020-03-20 NOTE — Progress Notes (Signed)
Inpatient Diabetes Program Recommendations  AACE/ADA: New Consensus Statement on Inpatient Glycemic Control   Target Ranges:  Prepandial:   less than 140 mg/dL      Peak postprandial:   less than 180 mg/dL (1-2 hours)      Critically ill patients:  140 - 180 mg/dL   Results for Stephanie [name], Stephanie [name] (MRN [id number]) as of 03/20/2020 09:16  Ref. Range 03/19/2020 07:50 03/19/2020 10:57 03/19/2020 16:16 03/19/2020 20:40 03/20/2020 00:26 03/20/2020 04:21 03/20/2020 08:07  Glucose-Capillary Latest Ref Range: 70 - 99 mg/dL 141 (H) 143 (H) 307 (H) 372 (H) 285 (H) 260 (H) 227 (H)   Review of Glycemic Control  Diabetes history: DM2 Outpatient Diabetes medications: Xultophy 100-3.6 80 units QHS, Humalog 45-47 units with breakfast and supper, Humalog 30-32 units with lunch Current orders for Inpatient glycemic control: Levemir 45 units BID, Novolog 0-20 units Q4H, Novolog 18 units TID with meals; Solumedrol 50 mg Q12H  Inpatient Diabetes Program Recommendations:    Insulin: If steroids are continued as ordered, please consider increasing Levemir 50 units BID and increasing meal coverage to Novolog 23 units TID with meals if patient eats at least 50% of meals.  Thanks, Barnie Alderman, RN, MSN, CDE Diabetes Coordinator Inpatient Diabetes Program [phone] (Team Pager from 8am to 5pm)

## 2020-03-20 NOTE — Progress Notes (Signed)
PROGRESS NOTE    Stephanie [name]  URK:[id number] DOB: 12-07-1950 DOA: 03/17/2020 PCP: [name], FNP   Brief Narrative:  Stephanie Passarella Doeis a 70 y.o.femalewith medical history significant ofCovid pneumonia just discharged today to home from the hospital after completing remdesivir on steroids. Patient normally is on 2 L of oxygen and was discharged on 6 L with exertion but at rest was only requiring 4 L. She was transferred by ambulance home by the time she got home she was extremely short of breath and hypoxic.  Patient was readmitted for acute hypoxemic respiratory failure in the setting of Covid pneumonia.  Pulmonology following.  Assessment & Plan:   Principal Problem:   Acute respiratory disease due to COVID-19 virus Active Problems:   Hypertension   COPD (chronic obstructive pulmonary disease) (HCC)   MGUS (monoclonal gammopathy of unknown significance)   Pneumonia due to COVID-19 virus   COVID-19   Acute hypoxemic respiratory failure secondary to COVID-19 pneumonia -Continue steroids and wean off O2 supplementation as tolerated.  -Procalcitonin low, not needing antibiotics currently -Breathing treatments as needed -Continue to trend inflammatory markers -Appreciate ongoing pulmonology consultation  Acute COPD exacerbation related to above -IV steroids and breathing treatments as noted above -no wheezing on exam.  Hypokalemia -Repleted -Holding torsemide -follow electrolytes trend  Type 2 diabetes with hyperglycemia-improved -Restarted on SSI, will continue Tradjenta and increased dose of Levemir -Recent A1c of 9.2% -expecting improvement in her CBG's while tapering off steroids.   CKD stage IIIa with AKI -continue holding torsemide  -follow renal function trend -advise to maintain adequate oral hydration  -Follow labs -I/Os  Hypertension -stable -continue holding demadex for now.  -follow heart healthy diet.  Hypothyroidism -Continue  Synthroid  Class 1 Obesity -low calorie diet, portion control and increase physical activity recommended.  -Body mass index is 33.89 kg/m.   DVT prophylaxis: Eliquis Code Status: Full Family Communication: no family at bedside; unable to contact family over the phone.  Disposition Plan:  Status is: Inpatient  Remains inpatient appropriate because:IV treatments appropriate due to intensity of illness or inability to take PO and Inpatient level of care appropriate due to severity of illness   Dispo: The patient is from: Home  Anticipated d/c is to: Home  Anticipated d/c date is: 2-3 days  Patient currently is not medically stable to d/c.              Difficult to place patient No   Consultants:   Pulmonology  Procedures:   See below   Nutritional Assessment:  The patient's BMI is: Body mass index is 33.89 kg/m.Marland Kitchen  Antimicrobials:  Anti-infectives (From admission, onward)   Start     Dose/Rate Route Frequency Ordered Stop   03/17/20 2030  levofloxacin (LEVAQUIN) IVPB 750 mg        750 mg 100 mL/hr over 90 Minutes Intravenous  Once 03/17/20 2017 03/17/20 2209      Subjective: Short winded with activity, complaining of coughing spells, no fever. No CP, no nausea, no vomiting. Slightly better overall. Using 4.5-5 L HFNC supplementation.  Objective: Vitals:   03/20/20 0555 03/20/20 0926 03/20/20 1427 03/20/20 1436  BP: 125/73   118/67  Pulse: 82   81  Resp: 18   20  Temp: (!) 97.5 F (36.4 C)   98.1 F (36.7 C)  TempSrc: Oral   Oral  SpO2: 95% 90% 95% 93%  Weight:      Height:        Intake/Output  Summary (Last 24 hours) at 03/20/2020 1719 Last data filed at 03/20/2020 1200 Gross per 24 hour  Intake 960 ml  Output 500 ml  Net 460 ml   Filed Weights   03/17/20 1902 03/18/20 0430  Weight: 103 kg 101.1 kg    Examination: General exam: Alert, awake, oriented x 3; reports feeling better today; no CP, no nausea,  no vomiting, no fever. Short winded with exertion and complaining of intermittent clear sputum productive coughing spells. Respiratory system: no wheezing, no crackles, no using accessory muscles. Requiring 4.5-5L HFNC. Cardiovascular system:RRR. No rubs, no gallops, no JVD. Gastrointestinal system: Abdomen is nondistended, soft and nontender. No organomegaly or masses felt. Normal bowel sounds heard. Central nervous system: Alert and oriented. No focal neurological deficits. Extremities: No cyanosis, no clubbing  Skin: No petechiae. Psychiatry: Judgement and insight appear normal. Mood & affect appropriate.    Data Reviewed: I have personally reviewed following labs and imaging studies  CBC: Recent Labs  Lab 03/16/20 0716 03/17/20 0500 03/17/20 1906 03/18/20 0404 03/19/20 0453 03/20/20 0457  WBC 11.8* 15.2* 18.4* 15.9* 16.2* 10.5  NEUTROABS 10.4*  --  16.6* 14.8* 13.2* 9.2*  HGB 13.8 13.8 14.2 13.9 13.5 13.6  HCT 43.6 43.0 44.2 43.9 41.6 43.0  MCV 78.3* 77.1* 76.6* 77.6* 77.6* 78.3*  PLT 387 370 443* 379 373 937   Basic Metabolic Panel: Recent Labs  Lab 03/14/20 0636 03/15/20 0400 03/16/20 0716 03/17/20 0500 03/17/20 1906 03/18/20 0404 03/18/20 1355 03/19/20 0453 03/20/20 0457  NA 135 135 134* 131* 129* 131*  --  131* 134*  K 3.6 3.5 3.7 3.6 3.3* 3.5  --  2.8* 3.7  CL 106 103 99 95* 93* 91*  --  92* 95*  CO2 23 23 27 26 24 26   --  26 27  GLUCOSE 152* 183* 240* 189* 280* 324* 596* 216* 286*  BUN 44* 45* 50* 58* 66* 69*  --  86* 85*  CREATININE 1.06* 1.18* 1.39* 1.43* 1.78* 1.60*  --  1.75* 1.73*  CALCIUM 8.1* 8.1* 8.1* 8.3* 8.4* 8.3*  --  8.5* 8.4*  MG 2.2 2.1 1.9 1.8  --   --   --  2.1  --   PHOS 3.4 3.4 4.4  --   --   --   --   --   --    GFR: Estimated Creatinine Clearance: 38.2 mL/min (A) (by C-G formula based on SCr of 1.73 mg/dL (H)).   Liver Function Tests: Recent Labs  Lab 03/17/20 0500 03/17/20 1906 03/18/20 0404 03/19/20 0453 03/20/20 0457  AST  43* 49* 35 55* 55*  ALT 64* 77* 69* 83* 97*  ALKPHOS 212* 263* 224* 250* 256*  BILITOT 0.5 0.6 0.6 0.5 0.7  PROT 6.8 7.8 7.1 6.6 6.8  ALBUMIN 2.2* 2.7* 2.4* 2.4* 2.3*   CBG: Recent Labs  Lab 03/20/20 0026 03/20/20 0421 03/20/20 0807 03/20/20 1158 03/20/20 1638  GLUCAP 285* 260* 227* 324* 320*   Anemia Panel: Recent Labs    03/19/20 0453 03/20/20 0457  FERRITIN 922* 723*   Sepsis Labs: Recent Labs  Lab 03/17/20 2017 03/17/20 2109 03/18/20 0052 03/18/20 0404  PROCALCITON  --  0.16  --   --   LATICACIDVEN 3.7*  --  2.8* 2.2*    Recent Results (from the past 240 hour(s))  Blood Culture (routine x 2)     Status: None   Collection Time: 03/11/20  1:45 PM   Specimen: BLOOD  Result Value Ref Range Status  Specimen Description BLOOD  Final   Special Requests NONE  Final   Culture   Final    NO GROWTH 5 DAYS Performed at [facility], [address] [place], [place] [address]    Report Status 03/16/2020 FINAL  Final  SARS Coronavirus 2 by RT PCR (hospital order, performed in [facility] hospital lab) Nasopharyngeal Nasopharyngeal Swab     Status: Abnormal   Collection Time: 03/11/20  1:47 PM   Specimen: Nasopharyngeal Swab  Result Value Ref Range Status   SARS Coronavirus 2 POSITIVE (A) NEGATIVE Final    Comment: RESULT CALLED TO, READ BACK BY AND VERIFIED WITH: MYRICK,B AT 1515 ON 03/11/20 BY HUFFINES,S. (NOTE) SARS-CoV-2 target nucleic acids are DETECTED  SARS-CoV-2 RNA is generally detectable in upper respiratory specimens  during the acute phase of infection.  Positive results are indicative  of the presence of the identified virus, but do not rule out bacterial infection or co-infection with other pathogens not detected by the test.  Clinical correlation with patient history and  other diagnostic information is necessary to determine patient infection status.  The expected result is negative.  Fact Sheet for Patients:    StrictlyIdeas.no   Fact Sheet for Healthcare Providers:   BankingDealers.co.za    This test is not yet approved or cleared by the [place] FDA and  has been authorized for detection and/or diagnosis of SARS-CoV-2 by FDA under an Emergency Use Authorization (EUA).  This EUA will remain in effect (meanin g this test can be used) for the duration of  the COVID-19 declaration under Section 564(b)(1) of the Act, 21 U.S.C. section 360-bbb-3(b)(1), unless the authorization is terminated or revoked sooner.  Performed at [facility], [address], [place], [place] [address]   Blood Culture (routine x 2)     Status: None   Collection Time: 03/11/20  1:58 PM   Specimen: BLOOD  Result Value Ref Range Status   Specimen Description BLOOD LEFT ARM  Final   Special Requests   Final    BOTTLES DRAWN AEROBIC AND ANAEROBIC Blood Culture adequate volume   Culture   Final    NO GROWTH 5 DAYS Performed at [facility], [address] [place], [place] [address]    Report Status 03/16/2020 FINAL  Final  Culture, blood (Routine X 2) w Reflex to ID Panel     Status: None (Preliminary result)   Collection Time: 03/17/20  9:00 PM   Specimen: BLOOD LEFT FOREARM  Result Value Ref Range Status   Specimen Description BLOOD LEFT FOREARM  Final   Special Requests   Final    BOTTLES DRAWN AEROBIC AND ANAEROBIC Blood Culture adequate volume   Culture   Final    NO GROWTH 3 DAYS Performed at [facility], [address], [place], [place] [address]    Report Status PENDING  Incomplete  Culture, blood (Routine X 2) w Reflex to ID Panel     Status: None (Preliminary result)   Collection Time: 03/17/20  9:00 PM   Specimen: BLOOD RIGHT FOREARM  Result Value Ref Range Status   Specimen Description BLOOD RIGHT FOREARM  Final   Special Requests   Final    BOTTLES DRAWN AEROBIC AND ANAEROBIC Blood Culture adequate volume   Culture   Final    NO GROWTH 3  DAYS Performed at [facility], [address], [place], [place] [address]    Report Status PENDING  Incomplete     Radiology Studies: No results  found.   Scheduled Meds: . apixaban  5 mg Oral BID  . vitamin C  1,000 mg Oral Daily  . cholecalciferol  4,000 Units Oral Daily  . insulin aspart  0-20 Units Subcutaneous Q4H  . [START ON 03/21/2020] insulin aspart  22 Units Subcutaneous TID WC  . insulin detemir  50 Units Subcutaneous BID  . Ipratropium-Albuterol  1 puff Inhalation TID  . levothyroxine  88 mcg Oral QAC breakfast  . loratadine  10 mg Oral Daily  . methylPREDNISolone (SOLU-MEDROL) injection  50 mg Intravenous Q12H  . metoprolol tartrate  25 mg Oral BID  . pantoprazole  40 mg Oral BID AC  . pravastatin  80 mg Oral QHS  . pregabalin  75 mg Oral BID  . sodium chloride flush  3 mL Intravenous Q12H  . traZODone  50 mg Oral QHS   Continuous Infusions: . sodium chloride       LOS: 3 days    Time spent: 35 minutes    Barton Dubois, MD [facility]  If 7PM-7AM, please contact night-coverage [email or web address] 03/20/2020, 5:19 PM

## 2020-03-21 ENCOUNTER — Ambulatory Visit: Payer: Medicare Other | Admitting: Nutrition

## 2020-03-21 LAB — CBC WITH DIFFERENTIAL/PLATELET
Abs Immature Granulocytes: 0.11 10*3/uL — ABNORMAL HIGH (ref 0.00–0.07)
Basophils Absolute: 0 10*3/uL (ref 0.0–0.1)
Basophils Relative: 0 %
Eosinophils Absolute: 0 10*3/uL (ref 0.0–0.5)
Eosinophils Relative: 0 %
HCT: 47.3 % — ABNORMAL HIGH (ref 36.0–46.0)
Hemoglobin: 14.8 g/dL (ref 12.0–15.0)
Immature Granulocytes: 1 %
Lymphocytes Relative: 9 %
Lymphs Abs: 1.1 10*3/uL (ref 0.7–4.0)
MCH: 25 pg — ABNORMAL LOW (ref 26.0–34.0)
MCHC: 31.3 g/dL (ref 30.0–36.0)
MCV: 79.8 fL — ABNORMAL LOW (ref 80.0–100.0)
Monocytes Absolute: 0.7 10*3/uL (ref 0.1–1.0)
Monocytes Relative: 6 %
Neutro Abs: 10.5 10*3/uL — ABNORMAL HIGH (ref 1.7–7.7)
Neutrophils Relative %: 84 %
Platelets: 385 10*3/uL (ref 150–400)
RBC: 5.93 MIL/uL — ABNORMAL HIGH (ref 3.87–5.11)
RDW: 18.2 % — ABNORMAL HIGH (ref 11.5–15.5)
WBC: 12.4 10*3/uL — ABNORMAL HIGH (ref 4.0–10.5)
nRBC: 0 % (ref 0.0–0.2)

## 2020-03-21 LAB — COMPREHENSIVE METABOLIC PANEL
ALT: 128 U/L — ABNORMAL HIGH (ref 0–44)
AST: 68 U/L — ABNORMAL HIGH (ref 15–41)
Albumin: 2.9 g/dL — ABNORMAL LOW (ref 3.5–5.0)
Alkaline Phosphatase: 284 U/L — ABNORMAL HIGH (ref 38–126)
Anion gap: 8 (ref 5–15)
BUN: 65 mg/dL — ABNORMAL HIGH (ref 8–23)
CO2: 30 mmol/L (ref 22–32)
Calcium: 8.8 mg/dL — ABNORMAL LOW (ref 8.9–10.3)
Chloride: 98 mmol/L (ref 98–111)
Creatinine, Ser: 1.34 mg/dL — ABNORMAL HIGH (ref 0.44–1.00)
GFR, Estimated: 43 mL/min — ABNORMAL LOW (ref >60)
Glucose, Bld: 207 mg/dL — ABNORMAL HIGH (ref 70–99)
Potassium: 4.1 mmol/L (ref 3.5–5.1)
Sodium: 136 mmol/L (ref 135–145)
Total Bilirubin: 0.7 mg/dL (ref 0.3–1.2)
Total Protein: 7.6 g/dL (ref 6.5–8.1)

## 2020-03-21 LAB — C-REACTIVE PROTEIN: CRP: 0.8 mg/dL (ref <1.0)

## 2020-03-21 LAB — GLUCOSE, CAPILLARY
Glucose-Capillary: 195 mg/dL — ABNORMAL HIGH (ref 70–99)
Glucose-Capillary: 211 mg/dL — ABNORMAL HIGH (ref 70–99)
Glucose-Capillary: 272 mg/dL — ABNORMAL HIGH (ref 70–99)
Glucose-Capillary: 219 mg/dL — ABNORMAL HIGH (ref 70–99)
Glucose-Capillary: 282 mg/dL — ABNORMAL HIGH (ref 70–99)

## 2020-03-21 LAB — FERRITIN: Ferritin: 842 ng/mL — ABNORMAL HIGH (ref 11–307)

## 2020-03-21 LAB — D-DIMER, QUANTITATIVE: D-Dimer, Quant: 0.88 ug/mL-FEU — ABNORMAL HIGH (ref 0.00–0.50)

## 2020-03-21 NOTE — Progress Notes (Signed)
PROGRESS NOTE    Stephanie [name]  AOZ:[id number] DOB: 1950/10/31 DOA: 03/17/2020 PCP: [name], FNP   Brief Narrative:  Stephanie Rayman Doeis a 70 y.o.femalewith medical history significant ofCovid pneumonia just discharged today to home from the hospital after completing remdesivir on steroids. Patient normally is on 2 L of oxygen and was discharged on 6 L with exertion but at rest was only requiring 4 L. She was transferred by ambulance home by the time she got home she was extremely short of breath and hypoxic.  Patient was readmitted for acute hypoxemic respiratory failure in the setting of Covid pneumonia.  Pulmonology following.  Assessment & Plan:   Principal Problem:   Acute respiratory disease due to COVID-19 virus Active Problems:   Hypertension   COPD (chronic obstructive pulmonary disease) (HCC)   MGUS (monoclonal gammopathy of unknown significance)   Pneumonia due to COVID-19 virus   COVID-19   Acute hypoxemic respiratory failure secondary to COVID-19 pneumonia -Continue steroids and wean off O2 supplementation as tolerated.  Patient was last discharged with oxygen supplementation with poor glucose.  Will reassess desaturation screening and adjust as necessary.  Hopefully able to go home tomorrow with a slow steroids tapering. -Procalcitonin low, not needing antibiotics currently -Continue breathing treatments as needed -Continue to trend inflammatory markers -Appreciate ongoing pulmonology consultation  Acute COPD exacerbation related to above -IV steroids and breathing treatments as noted above -no wheezing on exam.  Hypokalemia -Repleted -Holding torsemide -follow electrolytes trend  Type 2 diabetes with hyperglycemia-improved -Restarted on SSI, will continue Tradjenta and continue adjusted dose of Levemir. -Recent A1c of 9.2% -expecting improvement in her CBG's while tapering off steroids.   CKD stage IIIa with AKI -continue holding torsemide   -follow renal function trend -advise to maintain adequate oral hydration  -Follow labs -I/Os  Hypertension -stable -continue holding demadex for now.  -follow heart healthy diet.  Hypothyroidism -Continue Synthroid  Class 1 Obesity -low calorie diet, portion control and increase physical activity recommended.  -Body mass index is 33.89 kg/m.   DVT prophylaxis: Eliquis Code Status: Full Family Communication: no family at bedside; unable to contact family over the phone.  Disposition Plan:  Status is: Inpatient  Remains inpatient appropriate because:IV treatments appropriate due to intensity of illness or inability to take PO and Inpatient level of care appropriate due to severity of illness   Dispo: The patient is from: Home  Anticipated d/c is to: Home  Anticipated d/c date is: 1 day  Patient currently is not medically stable to d/c.              Difficult to place patient No   Consultants:   Pulmonology  Procedures:   See below   Nutritional Assessment:  The patient's BMI is: Body mass index is 33.89 kg/m.Marland Kitchen  Antimicrobials:  Anti-infectives (From admission, onward)   Start     Dose/Rate Route Frequency Ordered Stop   03/17/20 2030  levofloxacin (LEVAQUIN) IVPB 750 mg        750 mg 100 mL/hr over 90 Minutes Intravenous  Once 03/17/20 2017 03/17/20 2209      Subjective: Dry coughing spells intermittently has been reported.  No chest pain, no nausea, no vomiting, no fever.  Still short but expressed improvement in symptoms. Sedation on 3.5-4 L high flow nasal cannula. Objective: Vitals:   03/21/20 0800 03/21/20 0932 03/21/20 1400 03/21/20 1532  BP:  115/61 (!) 142/73   Pulse:  80 76   Resp:   19  Temp:   98.3 F (36.8 C)   TempSrc:      SpO2: 94%  100% 100%  Weight:      Height:        Intake/Output Summary (Last 24 hours) at 03/21/2020 1717 Last data filed at 03/20/2020 2030 Gross per 24 hour   Intake 240 ml  Output -  Net 240 ml   Filed Weights   03/17/20 1902 03/18/20 0430  Weight: 103 kg 101.1 kg    Examination: General exam: Alert, awake, oriented x 3; reporting improvement in her symptoms.  No chest pain, no nausea, no vomiting.  Still short winded with activity but requiring only 3.5-4 L nasal, supplementation. Respiratory system: Positive for intermittent; mild difficulty completing full sentences.  Using accessory muscle.  Good saturation on: Oxygen supplementation. Cardiovascular system:RRR. No murmurs, rubs, gallops.  No JVD. Gastrointestinal system: Abdomen is nondistended, soft and nontender. No organomegaly or masses felt. Normal bowel sounds heard. Central nervous system: Alert and oriented. No focal neurological deficits. Extremities: No cyanosis or clubbing. Skin: No petechiae. Psychiatry: Judgement and insight appear normal. Mood & affect appropriate.    Data Reviewed: I have personally reviewed following labs and imaging studies  CBC: Recent Labs  Lab 03/17/20 1906 03/18/20 0404 03/19/20 0453 03/20/20 0457 03/21/20 0806  WBC 18.4* 15.9* 16.2* 10.5 12.4*  NEUTROABS 16.6* 14.8* 13.2* 9.2* 10.5*  HGB 14.2 13.9 13.5 13.6 14.8  HCT 44.2 43.9 41.6 43.0 47.3*  MCV 76.6* 77.6* 77.6* 78.3* 79.8*  PLT 443* 379 373 377 397   Basic Metabolic Panel: Recent Labs  Lab 03/15/20 0400 03/16/20 0716 03/17/20 0500 03/17/20 1906 03/18/20 0404 03/18/20 1355 03/19/20 0453 03/20/20 0457 03/21/20 0806  NA 135 134* 131* 129* 131*  --  131* 134* 136  K 3.5 3.7 3.6 3.3* 3.5  --  2.8* 3.7 4.1  CL 103 99 95* 93* 91*  --  92* 95* 98  CO2 23 27 26 24 26   --  26 27 30   GLUCOSE 183* 240* 189* 280* 324* 596* 216* 286* 207*  BUN 45* 50* 58* 66* 69*  --  86* 85* 65*  CREATININE 1.18* 1.39* 1.43* 1.78* 1.60*  --  1.75* 1.73* 1.34*  CALCIUM 8.1* 8.1* 8.3* 8.4* 8.3*  --  8.5* 8.4* 8.8*  MG 2.1 1.9 1.8  --   --   --  2.1  --   --   PHOS 3.4 4.4  --   --   --   --   --    --   --    GFR: Estimated Creatinine Clearance: 49.3 mL/min (A) (by C-G formula based on SCr of 1.34 mg/dL (H)).   Liver Function Tests: Recent Labs  Lab 03/17/20 1906 03/18/20 0404 03/19/20 0453 03/20/20 0457 03/21/20 0806  AST 49* 35 55* 55* 68*  ALT 77* 69* 83* 97* 128*  ALKPHOS 263* 224* 250* 256* 284*  BILITOT 0.6 0.6 0.5 0.7 0.7  PROT 7.8 7.1 6.6 6.8 7.6  ALBUMIN 2.7* 2.4* 2.4* 2.3* 2.9*   CBG: Recent Labs  Lab 03/20/20 2349 03/21/20 0322 03/21/20 0758 03/21/20 1151 03/21/20 1647  GLUCAP 334* 195* 211* 272* 219*   Anemia Panel: Recent Labs    03/20/20 0457 03/21/20 0806  FERRITIN 723* 842*   Sepsis Labs: Recent Labs  Lab 03/17/20 2017 03/17/20 2109 03/18/20 0052 03/18/20 0404  PROCALCITON  --  0.16  --   --   LATICACIDVEN 3.7*  --  2.8* 2.2*  Recent Results (from the past 240 hour(s))  Culture, blood (Routine X 2) w Reflex to ID Panel     Status: None (Preliminary result)   Collection Time: 03/17/20  9:00 PM   Specimen: BLOOD LEFT FOREARM  Result Value Ref Range Status   Specimen Description BLOOD LEFT FOREARM  Final   Special Requests   Final    BOTTLES DRAWN AEROBIC AND ANAEROBIC Blood Culture adequate volume   Culture   Final    NO GROWTH 4 DAYS Performed at [facility], [address], [place], [place] [address]    Report Status PENDING  Incomplete  Culture, blood (Routine X 2) w Reflex to ID Panel     Status: None (Preliminary result)   Collection Time: 03/17/20  9:00 PM   Specimen: BLOOD RIGHT FOREARM  Result Value Ref Range Status   Specimen Description BLOOD RIGHT FOREARM  Final   Special Requests   Final    BOTTLES DRAWN AEROBIC AND ANAEROBIC Blood Culture adequate volume   Culture   Final    NO GROWTH 4 DAYS Performed at [facility], [address], [place], [place] [address]    Report Status PENDING  Incomplete     Radiology Studies: No results found.   Scheduled Meds: . apixaban  5 mg Oral BID  . vitamin C  1,000 mg  Oral Daily  . cholecalciferol  4,000 Units Oral Daily  . insulin aspart  0-20 Units Subcutaneous Q4H  . insulin aspart  22 Units Subcutaneous TID WC  . insulin detemir  50 Units Subcutaneous BID  . Ipratropium-Albuterol  1 puff Inhalation TID  . levothyroxine  88 mcg Oral QAC breakfast  . loratadine  10 mg Oral Daily  . methylPREDNISolone (SOLU-MEDROL) injection  40 mg Intravenous Q12H  . metoprolol tartrate  25 mg Oral BID  . pantoprazole  40 mg Oral BID AC  . pravastatin  80 mg Oral QHS  . pregabalin  75 mg Oral BID  . sodium chloride flush  3 mL Intravenous Q12H  . traZODone  50 mg Oral QHS   Continuous Infusions: . sodium chloride       LOS: 4 days    Time spent: 35 minutes    Barton Dubois, MD [facility]  If 7PM-7AM, please contact night-coverage [email or web address] 03/21/2020, 5:17 PM

## 2020-03-21 NOTE — Care Management Important Message (Signed)
Important Message  Patient Details  Name: Stephanie [name] MRN: [id number] Date of Birth: 11-Aug-1950   Medicare Important Message Given:  Yes - Important Message mailed due to current National Emergency     Tommy Medal 03/21/2020, 12:36 PM

## 2020-03-22 DIAGNOSIS — I5032 Chronic diastolic (congestive) heart failure: Secondary | ICD-10-CM

## 2020-03-22 DIAGNOSIS — I5033 Acute on chronic diastolic (congestive) heart failure: Secondary | ICD-10-CM

## 2020-03-22 LAB — CBC WITH DIFFERENTIAL/PLATELET
Abs Immature Granulocytes: 0.23 10*3/uL — ABNORMAL HIGH (ref 0.00–0.07)
Basophils Absolute: 0 10*3/uL (ref 0.0–0.1)
Basophils Relative: 0 %
Eosinophils Absolute: 0 10*3/uL (ref 0.0–0.5)
Eosinophils Relative: 0 %
HCT: 43.7 % (ref 36.0–46.0)
Hemoglobin: 13.4 g/dL (ref 12.0–15.0)
Immature Granulocytes: 2 %
Lymphocytes Relative: 8 %
Lymphs Abs: 0.9 10*3/uL (ref 0.7–4.0)
MCH: 24.6 pg — ABNORMAL LOW (ref 26.0–34.0)
MCHC: 30.7 g/dL (ref 30.0–36.0)
MCV: 80.2 fL (ref 80.0–100.0)
Monocytes Absolute: 0.7 10*3/uL (ref 0.1–1.0)
Monocytes Relative: 5 %
Neutro Abs: 10.6 10*3/uL — ABNORMAL HIGH (ref 1.7–7.7)
Neutrophils Relative %: 85 %
Platelets: 295 10*3/uL (ref 150–400)
RBC: 5.45 MIL/uL — ABNORMAL HIGH (ref 3.87–5.11)
RDW: 17.2 % — ABNORMAL HIGH (ref 11.5–15.5)
WBC: 12.4 10*3/uL — ABNORMAL HIGH (ref 4.0–10.5)
nRBC: 0 % (ref 0.0–0.2)

## 2020-03-22 LAB — CULTURE, BLOOD (ROUTINE X 2)
Culture: NO GROWTH
Culture: NO GROWTH
Special Requests: ADEQUATE
Special Requests: ADEQUATE

## 2020-03-22 LAB — COMPREHENSIVE METABOLIC PANEL
ALT: 131 U/L — ABNORMAL HIGH (ref 0–44)
AST: 70 U/L — ABNORMAL HIGH (ref 15–41)
Albumin: 2.5 g/dL — ABNORMAL LOW (ref 3.5–5.0)
Alkaline Phosphatase: 238 U/L — ABNORMAL HIGH (ref 38–126)
Anion gap: 13 (ref 5–15)
BUN: 57 mg/dL — ABNORMAL HIGH (ref 8–23)
CO2: 27 mmol/L (ref 22–32)
Calcium: 8.9 mg/dL (ref 8.9–10.3)
Chloride: 97 mmol/L — ABNORMAL LOW (ref 98–111)
Creatinine, Ser: 1.21 mg/dL — ABNORMAL HIGH (ref 0.44–1.00)
GFR, Estimated: 49 mL/min — ABNORMAL LOW (ref >60)
Glucose, Bld: 185 mg/dL — ABNORMAL HIGH (ref 70–99)
Potassium: 4.6 mmol/L (ref 3.5–5.1)
Sodium: 137 mmol/L (ref 135–145)
Total Bilirubin: 0.9 mg/dL (ref 0.3–1.2)
Total Protein: 6.5 g/dL (ref 6.5–8.1)

## 2020-03-22 LAB — GLUCOSE, CAPILLARY
Glucose-Capillary: 199 mg/dL — ABNORMAL HIGH (ref 70–99)
Glucose-Capillary: 215 mg/dL — ABNORMAL HIGH (ref 70–99)
Glucose-Capillary: 221 mg/dL — ABNORMAL HIGH (ref 70–99)
Glucose-Capillary: 342 mg/dL — ABNORMAL HIGH (ref 70–99)

## 2020-03-22 LAB — FERRITIN: Ferritin: 835 ng/mL — ABNORMAL HIGH (ref 11–307)

## 2020-03-22 LAB — C-REACTIVE PROTEIN: CRP: 0.6 mg/dL (ref <1.0)

## 2020-03-22 LAB — D-DIMER, QUANTITATIVE: D-Dimer, Quant: 0.65 ug/mL-FEU — ABNORMAL HIGH (ref 0.00–0.50)

## 2020-03-22 MED ORDER — METOLAZONE 5 MG PO TABS: 10.0000 mg | ORAL_TABLET | Freq: Every day | ORAL | Status: DC

## 2020-03-22 MED ORDER — CIMETIDINE 200 MG PO TABS: 100.0000 mg | ORAL_TABLET | Freq: Every day | ORAL | Status: DC | PRN

## 2020-03-22 MED ORDER — XULTOPHY 100-3.6 UNIT-MG/ML ~~LOC~~ SOPN: 85.0000 [IU] | PEN_INJECTOR | Freq: Every day | SUBCUTANEOUS | 0 refills | Status: DC

## 2020-03-22 MED ORDER — METOPROLOL TARTRATE 25 MG PO TABS: 25.0000 mg | ORAL_TABLET | ORAL | Status: DC

## 2020-03-22 MED ORDER — PREDNISONE 20 MG PO TABS: ORAL_TABLET | ORAL | 0 refills | Status: DC

## 2020-03-22 NOTE — Progress Notes (Signed)
Inpatient Diabetes Program Recommendations  AACE/ADA: New Consensus Statement on Inpatient Glycemic Control  Target Ranges:  Prepandial:   less than 140 mg/dL      Peak postprandial:   less than 180 mg/dL (1-2 hours)      Critically ill patients:  140 - 180 mg/dL   Results for PENNYE, BEEGHLY (MRN [id number]) as of 03/22/2020 09:55  Ref. Range 03/21/2020 07:58 03/21/2020 11:51 03/21/2020 16:47 03/21/2020 20:39 03/21/2020 23:59 03/22/2020 04:45 03/22/2020 08:40  Glucose-Capillary Latest Ref Range: 70 - 99 mg/dL 211 (H) 272 (H) 219 (H) 282 (H) 199 (H) 215 (H) 221 (H)   Review of Glycemic Control  Diabetes history:DM2 Outpatient Diabetes medications:Xultophy 100-3.6 80 units QHS, Humalog 45-47 units with breakfast and supper, Humalog 30-32 units with lunch Current orders for Inpatient glycemic control:Levemir 50 units BID, Novolog 0-20 units Q4H, Novolog 22 units TID with meals; Solumedrol 40 mg Q12H  Inpatient Diabetes Program Recommendations:  Insulin:If steroids are continued as ordered, please consider increasing Levemir 55 units BID and increasingmeal coverageto Novolog 27 units TID with mealsif patient eats at least 50% of meals.  Thanks, Barnie Alderman, RN, MSN, CDE Diabetes Coordinator Inpatient Diabetes Program [phone] (Team Pager from 8am to 5pm)

## 2020-03-22 NOTE — Discharge Summary (Signed)
Physician Discharge Summary  Stephanie [name] NID:[id number] DOB: 1950/08/23 DOA: 03/17/2020  PCP: [name], FNP  Admit date: 03/17/2020 Discharge date: 03/22/2020  Time spent: 35 minutes  Recommendations for Outpatient Follow-up:  1. Repeat basic metabolic panel to evaluate lites and renal function 2. Reassess blood pressure and adjust antihypertensive treatment as needed 3. Continue to closely follow CBGs and adjust hypoglycemic regimen as required 4. Please reassess the need for oxygen supplementation. 5. Repeat CXR in 6 weeks to assure complete resolution of infiltrates.   Discharge Diagnoses:  Principal Problem:   Acute respiratory disease due to COVID-19 virus Active Problems:   Hypertension   COPD (chronic obstructive pulmonary disease) (HCC)   MGUS (monoclonal gammopathy of unknown significance)   Pneumonia due to COVID-19 virus   COVID-19   Chronic diastolic CHF (congestive heart failure) ([place])   Discharge Condition: Stable and improved.  Discharged home with instruction to follow-up with PCP in 2 weeks.  CODE STATUS: Full code  Diet recommendation: Heart healthy modified to 100 diet.      Filed Weights   03/17/20 1902 03/18/20 0430  Weight: 103 kg 101.1 kg    History of present illness:  Stephanie [name] a 70 y.o.femalewith medical history significant ofCovid pneumonia just discharged today to home from the hospital after completing remdesivir on steroids. Patient normally is on 2 L of oxygen and was discharged on 6 L with exertion but at rest was only requiring 4 L. She was transferred by ambulance home by the time she got home she was extremely short of breath and hypoxic. Patient was readmitted for acute hypoxemic respiratory failure in the setting of Covid pneumonia. Pulmonology following.  Hospital Course:  Acute hypoxemic respiratory failure secondary to COVID-19 pneumonia -Continue steroids and wean off O2 supplementation as  tolerated.Patient was last discharged with oxygen supplementation 4L. -currently using 2L at rest and 3.5-4L on exertion -continue slow steroids tapering. -Procalcitonin low, not needing antibiotics currently -Continue breathing treatments as needed -Appreciate pulmonology consultation and rec's  Acute COPD exacerbation related to above -IV steroids and breathing treatments as noted above -no wheezing on exam.  Hypokalemia -Repleted -safe to resume diuretics and daily supplementation. -follow electrolytes trend  Type 2 diabetes with hyperglycemia-improved -Resume home adjusted hypoglycemic regimen -advise to follow low carb diet. -Recent A1c of 9.2% -expecting improvement in her CBG's while tapering off steroids.   CKD stage IIIa with AKI -Improved and back to baseline at discharge -Safe to resume the use of metolazone and Demadex -Continue to maintain adequate hydration -Repeat basic metabolic panel at follow-up visit to assess electrolytes and renal function and stability.  Hypertension -stable -follow heart healthy diet. -Resume home antihypertensive agents.  Hypothyroidism -Continue Synthroid  Class 1 Obesity -low calorie diet, portion control and increase physical activity recommended.  -Body mass index is 33.89 kg/m.  Chronic diastolic heart failure -Stable and compensated -Continue to follow daily weights, low-sodium diet and resume home diuretic regimen.  Procedures: See below for x-ray reports  Consultations:  Pulmonology service.  Discharge Exam:     Vitals:   03/22/20 0827 03/22/20 1358  BP:  111/70  Pulse:  86  Resp:  16  Temp:  97.7 F (36.5 C)  SpO2: 95% 99%    General: Afebrile, no chest pain, no nausea, no vomiting.  Feeling better overall and demonstrating good saturation using 2 L nasal cannula supplementation at rest and 3.5-4 L with ambulation.  Feeling ready to go home. Cardiovascular: S1-S2, no rubs, no  gallops, no JVD. Respiratory: Improved air movement bilaterally; no expiratory wheezing appreciated on exam.  Positive scattered rhonchi.  No using accessory muscles. Abdomen: Soft, nontender, nondistended, positive bowel sounds Extremities: No cyanosis or clubbing.  Discharge Instructions       Discharge Instructions    (HEART FAILURE PATIENTS) Call MD:  Anytime you have any of the following symptoms: 1) 3 pound weight gain in 24 hours or 5 pounds in 1 week 2) shortness of breath, with or without a dry hacking cough 3) swelling in the hands, feet or stomach 4) if you have to sleep on extra pillows at night in order to breathe.   Complete by: As directed    Diet - low sodium heart healthy   Complete by: As directed    Diet Carb Modified   Complete by: As directed    Discharge instructions   Complete by: As directed    Take medications as prescribed Please do self while increasing activity Continue to use oxygen supplementation as instructed Arrange follow-up with PCP in 2 weeks. Maintain adequate hydration Follow modified carbohydrate diet. Continue to practice the 3 W's: Wear a mask, Wash hands frequently and Wait 6 feet apart.   Increase activity slowly   Complete by: As directed           Allergies as of 03/22/2020      Reactions   Tetracyclines & Related Anaphylaxis   Banana Hives, Nausea And Vomiting   Penicillins Rash, Other (See Comments)   Has patient had a PCN reaction causing immediate rash, facial/tongue/throat swelling, SOB or lightheadedness with hypotension: No Has patient had a PCN reaction causing severe rash involving mucus membranes or skin necrosis: No Has patient had a PCN reaction that required hospitalization No Has patient had a PCN reaction occurring within the last 10 years: No If all of the above answers are "NO", then may proceed with Cephalosporin use.         Medication List    STOP taking these medications   diclofenac  sodium 1 % Gel Commonly known as: VOLTAREN     TAKE these medications   Accu-Chek FastClix Lancets Misc Apply topically.   acetaminophen 500 MG tablet Commonly known as: TYLENOL Take 1,000 mg by mouth every 6 (six) hours as needed for moderate pain or headache.   albuterol 108 (90 Base) MCG/ACT inhaler Commonly known as: VENTOLIN HFA Inhale 2 puffs into the lungs every 6 (six) hours as needed for wheezing or shortness of breath.   apixaban 5 MG Tabs tablet Commonly known as: ELIQUIS Take 10 mg by mouth BID through Friday 5/19 evening. Take 53m by mouth BID starting 5/20 AM. What changed:   how much to take  how to take this  when to take this  additional instructions   Arnuity Ellipta 100 MCG/ACT Aepb Generic drug: Fluticasone Furoate Inhale 1 puff into the lungs at bedtime.   cetirizine 10 MG tablet Commonly known as: ZYRTEC Take 10 mg by mouth daily.   cimetidine 200 MG tablet Commonly known as: TAGAMET Take 0.5 tablets (100 mg total) by mouth daily as needed.   CINNAMON PO Take 2-3 capsules by mouth 2 (two) times daily as needed (high blood sugar).   clobetasol ointment 0.05 % Commonly known as: TEMOVATE Apply 1 application topically 3 (three) times daily as needed (imflammation).   Cod Liver Oil Caps Take 1 capsule by mouth at bedtime.   diphenhydrAMINE 25 MG tablet Commonly known as: BENADRYL Take 25  mg by mouth every 6 (six) hours as needed for itching.   Doxepin HCl 5 % Crea Apply 2 g topically 3 (three) times daily as needed (itching).   DULoxetine 30 MG capsule Commonly known as: CYMBALTA Take 1 capsule (30 mg total) by mouth 2 (two) times daily.   fluticasone 27.5 MCG/SPRAY nasal spray Commonly known as: VERAMYST Place 2 sprays into the nose daily.   guaiFENesin 100 MG/5ML Soln Commonly known as: ROBITUSSIN Take 5 mLs (100 mg total) by mouth every 4 (four) hours as needed for cough or to loosen phlegm.   HumaLOG KwikPen  100 UNIT/ML KwikPen Generic drug: insulin lispro Inject 30-47 Units into the skin See admin instructions. If blood sugar is  90-150= 45 units, 150-200= 46 units. 200-250= 47 units at breakfast and supper Lunchtime: If BS is  90-150=30 units,150- 200=31 units,200-250 units 32   hydrOXYzine 25 MG tablet Commonly known as: ATARAX/VISTARIL Take 25 mg by mouth every 8 (eight) hours as needed for itching.   levothyroxine 88 MCG tablet Commonly known as: SYNTHROID Take 88 mcg by mouth daily before breakfast.   lubiprostone 8 MCG capsule Commonly known as: Amitiza Take 1 capsule (8 mcg total) by mouth 2 (two) times daily as needed for constipation.   Lyrica 75 MG capsule Generic drug: pregabalin Take 75 mg by mouth 2 (two) times daily.   methocarbamol 500 MG tablet Commonly known as: Robaxin Take 1 tablet (500 mg total) by mouth 3 (three) times daily as needed. What changed: reasons to take this   metolazone 5 MG tablet Commonly known as: ZAROXOLYN Take 2 tablets (10 mg total) by mouth at bedtime.   metoprolol tartrate 25 MG tablet Commonly known as: LOPRESSOR Take 1-1.5 tablets (25-37.5 mg total) by mouth See admin instructions. Take 37.5 mg in the morning and 25 mg at night   omega-3 acid ethyl esters 1 g capsule Commonly known as: LOVAZA Take 1 g by mouth in the morning, at noon, in the evening, and at bedtime.   pantoprazole 40 MG tablet Commonly known as: PROTONIX Take 1 tablet (40 mg total) by mouth 2 (two) times daily before a meal.   potassium chloride 10 MEQ tablet Commonly known as: KLOR-CON Take 10 mEq by mouth daily.   pravastatin 80 MG tablet Commonly known as: PRAVACHOL Take 1 tablet (80 mg total) by mouth at bedtime.   predniSONE 20 MG tablet Commonly known as: DELTASONE Take 3 tablets by mouth daily x2 days; then 2 tablet by mouth daily x2 days; then 1 tablet by mouth daily x3 days; then half tablet by mouth daily x3 days and stop  prednisone. What changed:   medication strength  See the new instructions.   torsemide 20 MG tablet Commonly known as: DEMADEX Take 2 tablets (40 mg total) by mouth daily. What changed:   how much to take  when to take this  reasons to take this   traMADol 50 MG tablet Commonly known as: ULTRAM Take 50 mg by mouth 3 (three) times daily as needed for moderate pain.   traZODone 50 MG tablet Commonly known as: DESYREL Take 50 mg by mouth at bedtime.   vitamin B-12 1000 MCG tablet Commonly known as: CYANOCOBALAMIN Take 1,000 mcg by mouth daily.   vitamin C 1000 MG tablet Take 1,000 mg by mouth daily.   Vitamin D (Ergocalciferol) 1.25 MG (50000 UNIT) Caps capsule Commonly known as: DRISDOL Take 50,000 Units by mouth every 7 (seven) days.   Vitamin  D 50 MCG (2000 UT) tablet Take 4,000 Units by mouth daily.   vitamin E 200 UNIT capsule Take 200 Units by mouth daily.   Xultophy 100-3.6 UNIT-MG/ML Sopn Generic drug: Insulin Degludec-Liraglutide Inject 85 Units as directed at bedtime. What changed: how much to take           Allergies  Allergen Reactions  . Tetracyclines & Related Anaphylaxis  . Banana Hives and Nausea And Vomiting  . Penicillins Rash and Other (See Comments)    Has patient had a PCN reaction causing immediate rash, facial/tongue/throat swelling, SOB or lightheadedness with hypotension: No Has patient had a PCN reaction causing severe rash involving mucus membranes or skin necrosis: No Has patient had a PCN reaction that required hospitalization No Has patient had a PCN reaction occurring within the last 10 years: No If all of the above answers are "NO", then may proceed with Cephalosporin use.         Follow-up Information        [name], FNP. Schedule an appointment as soon as possible for a visit in 2 week(s).   Specialty: Family Medicine Contact information: [place]  [address] [phone]                 The results of significant diagnostics from this hospitalization (including imaging, microbiology, ancillary and laboratory) are listed below for reference.    Significant Diagnostic Studies:  Imaging Results  DG Chest Port 1 View  Result Date: 03/17/2020 CLINICAL DATA:  Chest pain and cough with history of COVID-19 positivity EXAM: PORTABLE CHEST 1 VIEW COMPARISON:  03/15/2020 FINDINGS: Cardiac shadow is stable. Persistent and slightly increased airspace opacity is noted bilaterally consistent with given clinical history. No sizable effusion is noted. No bony abnormality is seen. IMPRESSION: Increased airspace opacity bilaterally consistent with the given clinical history of COVID-19 positivity. Electronically Signed   By: Inez Catalina M.D.   On: 03/17/2020 19:41   DG Chest Port 1 View  Result Date: 03/15/2020 CLINICAL DATA:  70 year old female with shortness of breath. Positive COVID-19 4 days ago. MGUS. EXAM: PORTABLE CHEST 1 VIEW COMPARISON:  Portable chest 03/11/2020 and earlier. FINDINGS: Portable AP upright view at 0829 hours. Stable lung volumes. Normal cardiac size and mediastinal contours. Visualized tracheal air column is within normal limits. Patchy and indistinct mostly peripheral bilateral pulmonary opacity persists, with asymmetric increased density at the left lung base. No pneumothorax or pleural effusion. Visualized tracheal air column is within normal limits. Stable visualized osseous structures. Prior shoulder arthroplasty. Paucity of bowel gas in the upper abdomen. IMPRESSION: Stable since 03/11/2020 patchy asymmetric pulmonary opacity compatible with COVID-19 pneumonia. Electronically Signed   By: Genevie Ann M.D.   On: 03/15/2020 08:44   DG Chest Portable 1 View  Result Date: 03/11/2020 CLINICAL DATA:  Cough EXAM: PORTABLE CHEST 1 VIEW COMPARISON:  09/15/2017 FINDINGS: The heart size and mediastinal contours are within  normal limits. Subtle bilateral heterogeneous and interstitial airspace opacity. The visualized skeletal structures are unremarkable. IMPRESSION: Subtle bilateral heterogeneous and interstitial airspace opacity, concerning for infection, including COVID-19 if clinically suspected. Electronically Signed   By: Eddie Candle M.D.   On: 03/11/2020 15:01   ECHOCARDIOGRAM LIMITED  Result Date: 03/16/2020    ECHOCARDIOGRAM LIMITED REPORT   Patient Name:   CADY HAFEN Estill Date of Exam: 03/16/2020 Medical Rec #:  [id number]   Height:       68.0 in Accession #:  [id number]  Weight:       227.3 lb Date of Birth:  [birth year]   BSA:          2.158 m Patient Age:    48 years    BP:           156/81 mmHg Patient Gender: F           HR:           73 bpm. Exam Location:  [facility] Procedure: Limited Echo, Cardiac Doppler and Color Doppler Indications:    Dyspnea  History:        Patient has prior history of Echocardiogram examinations, most                 recent 09/16/2017. COPD, Signs/Symptoms:Dyspnea; Risk                 Factors:Hypertension, Diabetes and Obesity. COVID+.  Sonographer:    Dustin Flock RDCS Referring Phys: [id number] Royanne Foots [facility]  Sonographer Comments: COVID+ IMPRESSIONS  1. Left ventricular ejection fraction, by estimation, is 55 to 60%. The left ventricle has normal function. The left ventricle has no regional wall motion abnormalities. Left ventricular diastolic parameters are consistent with Grade I diastolic dysfunction (impaired relaxation).  2. Right ventricular systolic function is normal. The right ventricular size is normal.  3. The mitral valve is normal in structure. No evidence of mitral valve regurgitation. No evidence of mitral stenosis.  4. The aortic valve is normal in structure. Aortic valve regurgitation is not visualized. No aortic stenosis is present.  5. The inferior vena cava is normal in size with greater than 50% respiratory variability, suggesting right atrial pressure of 3 mmHg.  FINDINGS  Left Ventricle: Left ventricular ejection fraction, by estimation, is 55 to 60%. The left ventricle has normal function. The left ventricle has no regional wall motion abnormalities. The left ventricular internal cavity size was normal in size. There is  no left ventricular hypertrophy. Left ventricular diastolic parameters are consistent with Grade I diastolic dysfunction (impaired relaxation). Indeterminate filling pressures. Right Ventricle: The right ventricular size is normal. No increase in right ventricular wall thickness. Right ventricular systolic function is normal. Left Atrium: Left atrial size was normal in size. Right Atrium: Right atrial size was normal in size. Pericardium: There is no evidence of pericardial effusion. Mitral Valve: The mitral valve is normal in structure. No evidence of mitral valve stenosis. Tricuspid Valve: The tricuspid valve is normal in structure. Tricuspid valve regurgitation is not demonstrated. No evidence of tricuspid stenosis. Aortic Valve: The aortic valve is normal in structure. Aortic valve regurgitation is not visualized. No aortic stenosis is present. Pulmonic Valve: The pulmonic valve was grossly normal. Pulmonic valve regurgitation is not visualized. No evidence of pulmonic stenosis. Aorta: The aortic root is normal in size and structure. Venous: The inferior vena cava is normal in size with greater than 50% respiratory variability, suggesting right atrial pressure of 3 mmHg. IAS/Shunts: No atrial level shunt detected by color flow Doppler. LEFT VENTRICLE PLAX 2D LVIDd:         3.75 cm  Diastology LVIDs:         2.76 cm  LV e' medial:    6.42 cm/s LV PW:         1.14 cm  LV E/e' medial:  13.5 LV IVS:        1.17 cm  LV e' lateral:   7.72 cm/s LVOT diam:     2.00 cm  LV E/e' lateral: 11.2 LVOT Area:     3.14 cm  RIGHT VENTRICLE RV S prime:     10.30 cm/s LEFT ATRIUM         Index LA diam:    3.30 cm 1.53 cm/m   AORTA Ao Root diam: 2.90 cm MITRAL VALVE MV  Area (PHT): 3.63 cm    SHUNTS MV Decel Time: 209 msec    Systemic Diam: 2.00 cm MV E velocity: 86.50 cm/s MV A velocity: 94.70 cm/s MV E/A ratio:  0.91 Mihai Croitoru MD Electronically signed by Sanda Klein MD Signature Date/Time: 03/16/2020/1:51:47 PM    Final      Microbiology:        Recent Results (from the past 240 hour(s))  Culture, blood (Routine X 2) w Reflex to ID Panel     Status: None   Collection Time: 03/17/20  9:00 PM   Specimen: BLOOD LEFT FOREARM  Result Value Ref Range Status   Specimen Description BLOOD LEFT FOREARM  Final   Special Requests   Final    BOTTLES DRAWN AEROBIC AND ANAEROBIC Blood Culture adequate volume   Culture   Final    NO GROWTH 5 DAYS Performed at [facility], [address] [place], [place] [address]    Report Status 03/22/2020 FINAL  Final  Culture, blood (Routine X 2) w Reflex to ID Panel     Status: None   Collection Time: 03/17/20  9:00 PM   Specimen: BLOOD RIGHT FOREARM  Result Value Ref Range Status   Specimen Description BLOOD RIGHT FOREARM  Final   Special Requests   Final    BOTTLES DRAWN AEROBIC AND ANAEROBIC Blood Culture adequate volume   Culture   Final    NO GROWTH 5 DAYS Performed at [facility], [address], [place], [place] [address]    Report Status 03/22/2020 FINAL  Final     Labs: Basic Metabolic Panel: Last Labs              Recent Labs  Lab 03/16/20 0716 03/17/20 0500 03/17/20 1906 03/18/20 0404 03/18/20 1355 03/19/20 0453 03/20/20 0457 03/21/20 0806 03/22/20 0322  NA 134* 131*   < > 131*  --  131* 134* 136 137  K 3.7 3.6   < > 3.5  --  2.8* 3.7 4.1 4.6  CL 99 95*   < > 91*  --  92* 95* 98 97*  CO2 27 26   < > 26  --  26 27 30 27   GLUCOSE 240* 189*   < > 324* 596* 216* 286* 207* 185*  BUN 50* 58*   < > 69*  --  86* 85* 65* 57*  CREATININE 1.39* 1.43*   < > 1.60*  --  1.75* 1.73* 1.34* 1.21*  CALCIUM 8.1* 8.3*   < > 8.3*  --  8.5* 8.4* 8.8* 8.9  MG 1.9 1.8  --    --   --  2.1  --   --   --   PHOS 4.4  --   --   --   --   --   --   --   --    < > = values in this interval not displayed.     Liver Function Tests: Last Labs          Recent Labs  Lab 03/18/20 0404 03/19/20 0453 03/20/20 0457 03/21/20 0806 03/22/20 0322  AST 35 55* 55* 68* 70*  ALT 69* 83*  97* 128* 131*  ALKPHOS 224* 250* 256* 284* 238*  BILITOT 0.6 0.5 0.7 0.7 0.9  PROT 7.1 6.6 6.8 7.6 6.5  ALBUMIN 2.4* 2.4* 2.3* 2.9* 2.5*     CBC: Last Labs          Recent Labs  Lab 03/18/20 0404 03/19/20 0453 03/20/20 0457 03/21/20 0806 03/22/20 0322  WBC 15.9* 16.2* 10.5 12.4* 12.4*  NEUTROABS 14.8* 13.2* 9.2* 10.5* 10.6*  HGB 13.9 13.5 13.6 14.8 13.4  HCT 43.9 41.6 43.0 47.3* 43.7  MCV 77.6* 77.6* 78.3* 79.8* 80.2  PLT 379 373 377 385 295     BNP (last 3 results) Recent Labs (within last 365 days)      Recent Labs    03/11/20 1358 03/15/20 0400  BNP 68.0 59.0     CBG: Last Labs          Recent Labs  Lab 03/21/20 2039 03/21/20 2359 03/22/20 0445 03/22/20 0840 03/22/20 1106  GLUCAP 282* 199* 215* 221* 342*      Signed:  Barton Dubois MD.  [facility] 03/22/2020, 2:10 PM              Note Details  Wilhelmina Mcardle, MD File Time 03/22/2020 2:24 PM  Author Type Physician Status Signed  Last Editor Barton Dubois, MD Service Internal [place] # [id number] Admit Date 03/17/2020

## 2020-03-22 NOTE — Plan of Care (Signed)
  Problem: Nutrition: Goal: Adequate nutrition will be maintained Outcome: Adequate for Discharge   Problem: Pain Managment: Goal: General experience of comfort will improve Outcome: Adequate for Discharge   Problem: Skin Integrity: Goal: Risk for impaired skin integrity will decrease Outcome: Adequate for Discharge   Problem: Clinical Measurements: Goal: Respiratory complications will improve Outcome: Progressing   Problem: Activity: Goal: Risk for activity intolerance will decrease Outcome: Progressing   Problem: Safety: Goal: Ability to remain free from injury will improve Outcome: Progressing

## 2020-03-22 NOTE — Progress Notes (Signed)
Physician Discharge Summary  Stephanie [name] TZG:[id number] DOB: 1950/09/29 DOA: 03/17/2020  PCP: [name], FNP  Admit date: 03/17/2020 Discharge date: 03/22/2020  Time spent: 35 minutes  Recommendations for Outpatient Follow-up:  1. Repeat basic metabolic panel to evaluate lites and renal function 2. Reassess blood pressure and adjust antihypertensive treatment as needed 3. Continue to closely follow CBGs and adjust hypoglycemic regimen as required 4. Please reassess the need for oxygen supplementation. 5. Repeat CXR in 6 weeks to assure complete resolution of infiltrates.   Discharge Diagnoses:  Principal Problem:   Acute respiratory disease due to COVID-19 virus Active Problems:   Hypertension   COPD (chronic obstructive pulmonary disease) (HCC)   MGUS (monoclonal gammopathy of unknown significance)   Pneumonia due to COVID-19 virus   COVID-19   Chronic diastolic CHF (congestive heart failure) ([place])   Discharge Condition: Stable and improved.  Discharged home with instruction to follow-up with PCP in 2 weeks.  CODE STATUS: Full code  Diet recommendation: Heart healthy modified to 100 diet.  Filed Weights   03/17/20 1902 03/18/20 0430  Weight: 103 kg 101.1 kg    History of present illness:  Stephanie [name] a 70 y.o.femalewith medical history significant ofCovid pneumonia just discharged today to home from the hospital after completing remdesivir on steroids. Patient normally is on 2 L of oxygen and was discharged on 6 L with exertion but at rest was only requiring 4 L. She was transferred by ambulance home by the time she got home she was extremely short of breath and hypoxic.  Patient was readmitted for acute hypoxemic respiratory failure in the setting of Covid pneumonia.  Pulmonology following.  Hospital Course:  Acute hypoxemic respiratory failure secondary to COVID-19 pneumonia -Continue steroids and wean off O2 supplementation as tolerated.  Patient was last  discharged with oxygen supplementation 4L. -currently using 2L at rest and 3.5-4L on exertion -continue slow steroids tapering. -Procalcitonin low, not needing antibiotics currently -Continue breathing treatments as needed -Appreciate pulmonology consultation and rec's  Acute COPD exacerbation related to above -IV steroids and breathing treatments as noted above -no wheezing on exam.  Hypokalemia -Repleted -safe to resume diuretics and daily supplementation. -follow electrolytes trend  Type 2 diabetes with hyperglycemia-improved -Resume home adjusted hypoglycemic regimen -advise to follow low carb diet. -Recent A1c of 9.2% -expecting improvement in her CBG's while tapering off steroids.   CKD stage IIIa with AKI -Improved and back to baseline at discharge -Safe to resume the use of metolazone and Demadex -Continue to maintain adequate hydration -Repeat basic metabolic panel at follow-up visit to assess electrolytes and renal function and stability.  Hypertension -stable -follow heart healthy diet. -Resume home antihypertensive agents.  Hypothyroidism -Continue Synthroid  Class 1 Obesity -low calorie diet, portion control and increase physical activity recommended.  -Body mass index is 33.89 kg/m.  Chronic diastolic heart failure -Stable and compensated -Continue to follow daily weights, low-sodium diet and resume home diuretic regimen.  Procedures: See below for x-ray reports  Consultations:  Pulmonology service.  Discharge Exam: Vitals:   03/22/20 0827 03/22/20 1358  BP:  111/70  Pulse:  86  Resp:  16  Temp:  97.7 F (36.5 C)  SpO2: 95% 99%    General: Afebrile, no chest pain, no nausea, no vomiting.  Feeling better overall and demonstrating good saturation using 2 L nasal cannula supplementation at rest and 3.5-4 L with ambulation.  Feeling ready to go home. Cardiovascular: S1-S2, no rubs, no gallops, no JVD. Respiratory:  Improved air movement  bilaterally; no expiratory wheezing appreciated on exam.  Positive scattered rhonchi.  No using accessory muscles. Abdomen: Soft, nontender, nondistended, positive bowel sounds Extremities: No cyanosis or clubbing.  Discharge Instructions   Discharge Instructions    (HEART FAILURE PATIENTS) Call MD:  Anytime you have any of the following symptoms: 1) 3 pound weight gain in 24 hours or 5 pounds in 1 week 2) shortness of breath, with or without a dry hacking cough 3) swelling in the hands, feet or stomach 4) if you have to sleep on extra pillows at night in order to breathe.   Complete by: As directed    Diet - low sodium heart healthy   Complete by: As directed    Diet Carb Modified   Complete by: As directed    Discharge instructions   Complete by: As directed    Take medications as prescribed Please do self while increasing activity Continue to use oxygen supplementation as instructed Arrange follow-up with PCP in 2 weeks. Maintain adequate hydration Follow modified carbohydrate diet. Continue to practice the 3 W's: Wear a mask, Wash hands frequently and Wait 6 feet apart.   Increase activity slowly   Complete by: As directed      Allergies as of 03/22/2020      Reactions   Tetracyclines & Related Anaphylaxis   Banana Hives, Nausea And Vomiting   Penicillins Rash, Other (See Comments)   Has patient had a PCN reaction causing immediate rash, facial/tongue/throat swelling, SOB or lightheadedness with hypotension: No Has patient had a PCN reaction causing severe rash involving mucus membranes or skin necrosis: No Has patient had a PCN reaction that required hospitalization No Has patient had a PCN reaction occurring within the last 10 years: No If all of the above answers are "NO", then may proceed with Cephalosporin use.      Medication List    STOP taking these medications   diclofenac sodium 1 % Gel Commonly known as: VOLTAREN     TAKE these medications   Accu-Chek  FastClix Lancets Misc Apply topically.   acetaminophen 500 MG tablet Commonly known as: TYLENOL Take 1,000 mg by mouth every 6 (six) hours as needed for moderate pain or headache.   albuterol 108 (90 Base) MCG/ACT inhaler Commonly known as: VENTOLIN HFA Inhale 2 puffs into the lungs every 6 (six) hours as needed for wheezing or shortness of breath.   apixaban 5 MG Tabs tablet Commonly known as: ELIQUIS Take 10 mg by mouth BID through Friday 5/19 evening. Take 86m by mouth BID starting 5/20 AM. What changed:   how much to take  how to take this  when to take this  additional instructions   Arnuity Ellipta 100 MCG/ACT Aepb Generic drug: Fluticasone Furoate Inhale 1 puff into the lungs at bedtime.   cetirizine 10 MG tablet Commonly known as: ZYRTEC Take 10 mg by mouth daily.   cimetidine 200 MG tablet Commonly known as: TAGAMET Take 0.5 tablets (100 mg total) by mouth daily as needed.   CINNAMON PO Take 2-3 capsules by mouth 2 (two) times daily as needed (high blood sugar).   clobetasol ointment 0.05 % Commonly known as: TEMOVATE Apply 1 application topically 3 (three) times daily as needed (imflammation).   Cod Liver Oil Caps Take 1 capsule by mouth at bedtime.   diphenhydrAMINE 25 MG tablet Commonly known as: BENADRYL Take 25 mg by mouth every 6 (six) hours as needed for itching.   Doxepin HCl  5 % Crea Apply 2 g topically 3 (three) times daily as needed (itching).   DULoxetine 30 MG capsule Commonly known as: CYMBALTA Take 1 capsule (30 mg total) by mouth 2 (two) times daily.   fluticasone 27.5 MCG/SPRAY nasal spray Commonly known as: VERAMYST Place 2 sprays into the nose daily.   guaiFENesin 100 MG/5ML Soln Commonly known as: ROBITUSSIN Take 5 mLs (100 mg total) by mouth every 4 (four) hours as needed for cough or to loosen phlegm.   HumaLOG KwikPen 100 UNIT/ML KwikPen Generic drug: insulin lispro Inject 30-47 Units into the skin See admin  instructions. If blood sugar is  90-150= 45 units, 150-200= 46 units. 200-250= 47 units at breakfast and supper Lunchtime: If BS is  90-150=30 units,150- 200=31 units,200-250 units 32   hydrOXYzine 25 MG tablet Commonly known as: ATARAX/VISTARIL Take 25 mg by mouth every 8 (eight) hours as needed for itching.   levothyroxine 88 MCG tablet Commonly known as: SYNTHROID Take 88 mcg by mouth daily before breakfast.   lubiprostone 8 MCG capsule Commonly known as: Amitiza Take 1 capsule (8 mcg total) by mouth 2 (two) times daily as needed for constipation.   Lyrica 75 MG capsule Generic drug: pregabalin Take 75 mg by mouth 2 (two) times daily.   methocarbamol 500 MG tablet Commonly known as: Robaxin Take 1 tablet (500 mg total) by mouth 3 (three) times daily as needed. What changed: reasons to take this   metolazone 5 MG tablet Commonly known as: ZAROXOLYN Take 2 tablets (10 mg total) by mouth at bedtime.   metoprolol tartrate 25 MG tablet Commonly known as: LOPRESSOR Take 1-1.5 tablets (25-37.5 mg total) by mouth See admin instructions. Take 37.5 mg in the morning and 25 mg at night   omega-3 acid ethyl esters 1 g capsule Commonly known as: LOVAZA Take 1 g by mouth in the morning, at noon, in the evening, and at bedtime.   pantoprazole 40 MG tablet Commonly known as: PROTONIX Take 1 tablet (40 mg total) by mouth 2 (two) times daily before a meal.   potassium chloride 10 MEQ tablet Commonly known as: KLOR-CON Take 10 mEq by mouth daily.   pravastatin 80 MG tablet Commonly known as: PRAVACHOL Take 1 tablet (80 mg total) by mouth at bedtime.   predniSONE 20 MG tablet Commonly known as: DELTASONE Take 3 tablets by mouth daily x2 days; then 2 tablet by mouth daily x2 days; then 1 tablet by mouth daily x3 days; then half tablet by mouth daily x3 days and stop prednisone. What changed:   medication strength  See the new instructions.   torsemide 20 MG tablet Commonly  known as: DEMADEX Take 2 tablets (40 mg total) by mouth daily. What changed:   how much to take  when to take this  reasons to take this   traMADol 50 MG tablet Commonly known as: ULTRAM Take 50 mg by mouth 3 (three) times daily as needed for moderate pain.   traZODone 50 MG tablet Commonly known as: DESYREL Take 50 mg by mouth at bedtime.   vitamin B-12 1000 MCG tablet Commonly known as: CYANOCOBALAMIN Take 1,000 mcg by mouth daily.   vitamin C 1000 MG tablet Take 1,000 mg by mouth daily.   Vitamin D (Ergocalciferol) 1.25 MG (50000 UNIT) Caps capsule Commonly known as: DRISDOL Take 50,000 Units by mouth every 7 (seven) days.   Vitamin D 50 MCG (2000 UT) tablet Take 4,000 Units by mouth daily.   vitamin  E 200 UNIT capsule Take 200 Units by mouth daily.   Xultophy 100-3.6 UNIT-MG/ML Sopn Generic drug: Insulin Degludec-Liraglutide Inject 85 Units as directed at bedtime. What changed: how much to take      Allergies  Allergen Reactions  . Tetracyclines & Related Anaphylaxis  . Banana Hives and Nausea And Vomiting  . Penicillins Rash and Other (See Comments)    Has patient had a PCN reaction causing immediate rash, facial/tongue/throat swelling, SOB or lightheadedness with hypotension: No Has patient had a PCN reaction causing severe rash involving mucus membranes or skin necrosis: No Has patient had a PCN reaction that required hospitalization No Has patient had a PCN reaction occurring within the last 10 years: No If all of the above answers are "NO", then may proceed with Cephalosporin use.     Follow-up Information    [name], FNP. Schedule an appointment as soon as possible for a visit in 2 week(s).   Specialty: Family Medicine Contact information: [place] [address] [phone]               The results of significant diagnostics from this hospitalization (including imaging, microbiology, ancillary and  laboratory) are listed below for reference.    Significant Diagnostic Studies: DG Chest Port 1 View  Result Date: 03/17/2020 CLINICAL DATA:  Chest pain and cough with history of COVID-19 positivity EXAM: PORTABLE CHEST 1 VIEW COMPARISON:  03/15/2020 FINDINGS: Cardiac shadow is stable. Persistent and slightly increased airspace opacity is noted bilaterally consistent with given clinical history. No sizable effusion is noted. No bony abnormality is seen. IMPRESSION: Increased airspace opacity bilaterally consistent with the given clinical history of COVID-19 positivity. Electronically Signed   By: Inez Catalina M.D.   On: 03/17/2020 19:41   DG Chest Port 1 View  Result Date: 03/15/2020 CLINICAL DATA:  70 year old female with shortness of breath. Positive COVID-19 4 days ago. MGUS. EXAM: PORTABLE CHEST 1 VIEW COMPARISON:  Portable chest 03/11/2020 and earlier. FINDINGS: Portable AP upright view at 0829 hours. Stable lung volumes. Normal cardiac size and mediastinal contours. Visualized tracheal air column is within normal limits. Patchy and indistinct mostly peripheral bilateral pulmonary opacity persists, with asymmetric increased density at the left lung base. No pneumothorax or pleural effusion. Visualized tracheal air column is within normal limits. Stable visualized osseous structures. Prior shoulder arthroplasty. Paucity of bowel gas in the upper abdomen. IMPRESSION: Stable since 03/11/2020 patchy asymmetric pulmonary opacity compatible with COVID-19 pneumonia. Electronically Signed   By: Genevie Ann M.D.   On: 03/15/2020 08:44   DG Chest Portable 1 View  Result Date: 03/11/2020 CLINICAL DATA:  Cough EXAM: PORTABLE CHEST 1 VIEW COMPARISON:  09/15/2017 FINDINGS: The heart size and mediastinal contours are within normal limits. Subtle bilateral heterogeneous and interstitial airspace opacity. The visualized skeletal structures are unremarkable. IMPRESSION: Subtle bilateral heterogeneous and interstitial  airspace opacity, concerning for infection, including COVID-19 if clinically suspected. Electronically Signed   By: Eddie Candle M.D.   On: 03/11/2020 15:01   ECHOCARDIOGRAM LIMITED  Result Date: 03/16/2020    ECHOCARDIOGRAM LIMITED REPORT   Patient Name:   Stephanie [name] Date of Exam: 03/16/2020 Medical Rec #:  [id number]   Height:       68.0 in Accession #:    [id number]  Weight:       227.3 lb Date of Birth:  06-24-1950   BSA:          2.158 m Patient Age:  69 years    BP:           156/81 mmHg Patient Gender: F           HR:           73 bpm. Exam Location:  [facility] Procedure: Limited Echo, Cardiac Doppler and Color Doppler Indications:    Dyspnea  History:        Patient has prior history of Echocardiogram examinations, most                 recent 09/16/2017. COPD, Signs/Symptoms:Dyspnea; Risk                 Factors:Hypertension, Diabetes and Obesity. COVID+.  Sonographer:    Dustin Flock RDCS Referring Phys: [id number] Royanne Foots [facility]  Sonographer Comments: COVID+ IMPRESSIONS  1. Left ventricular ejection fraction, by estimation, is 55 to 60%. The left ventricle has normal function. The left ventricle has no regional wall motion abnormalities. Left ventricular diastolic parameters are consistent with Grade I diastolic dysfunction (impaired relaxation).  2. Right ventricular systolic function is normal. The right ventricular size is normal.  3. The mitral valve is normal in structure. No evidence of mitral valve regurgitation. No evidence of mitral stenosis.  4. The aortic valve is normal in structure. Aortic valve regurgitation is not visualized. No aortic stenosis is present.  5. The inferior vena cava is normal in size with greater than 50% respiratory variability, suggesting right atrial pressure of 3 mmHg. FINDINGS  Left Ventricle: Left ventricular ejection fraction, by estimation, is 55 to 60%. The left ventricle has normal function. The left ventricle has no regional wall motion abnormalities. The  left ventricular internal cavity size was normal in size. There is  no left ventricular hypertrophy. Left ventricular diastolic parameters are consistent with Grade I diastolic dysfunction (impaired relaxation). Indeterminate filling pressures. Right Ventricle: The right ventricular size is normal. No increase in right ventricular wall thickness. Right ventricular systolic function is normal. Left Atrium: Left atrial size was normal in size. Right Atrium: Right atrial size was normal in size. Pericardium: There is no evidence of pericardial effusion. Mitral Valve: The mitral valve is normal in structure. No evidence of mitral valve stenosis. Tricuspid Valve: The tricuspid valve is normal in structure. Tricuspid valve regurgitation is not demonstrated. No evidence of tricuspid stenosis. Aortic Valve: The aortic valve is normal in structure. Aortic valve regurgitation is not visualized. No aortic stenosis is present. Pulmonic Valve: The pulmonic valve was grossly normal. Pulmonic valve regurgitation is not visualized. No evidence of pulmonic stenosis. Aorta: The aortic root is normal in size and structure. Venous: The inferior vena cava is normal in size with greater than 50% respiratory variability, suggesting right atrial pressure of 3 mmHg. IAS/Shunts: No atrial level shunt detected by color flow Doppler. LEFT VENTRICLE PLAX 2D LVIDd:         3.75 cm  Diastology LVIDs:         2.76 cm  LV e' medial:    6.42 cm/s LV PW:         1.14 cm  LV E/e' medial:  13.5 LV IVS:        1.17 cm  LV e' lateral:   7.72 cm/s LVOT diam:     2.00 cm  LV E/e' lateral: 11.2 LVOT Area:     3.14 cm  RIGHT VENTRICLE RV S prime:     10.30 cm/s LEFT ATRIUM  Index LA diam:    3.30 cm 1.53 cm/m   AORTA Ao Root diam: 2.90 cm MITRAL VALVE MV Area (PHT): 3.63 cm    SHUNTS MV Decel Time: 209 msec    Systemic Diam: 2.00 cm MV E velocity: 86.50 cm/s MV A velocity: 94.70 cm/s MV E/A ratio:  0.91 Mihai Croitoru MD Electronically signed by  Sanda Klein MD Signature Date/Time: 03/16/2020/1:51:47 PM    Final     Microbiology: Recent Results (from the past 240 hour(s))  Culture, blood (Routine X 2) w Reflex to ID Panel     Status: None   Collection Time: 03/17/20  9:00 PM   Specimen: BLOOD LEFT FOREARM  Result Value Ref Range Status   Specimen Description BLOOD LEFT FOREARM  Final   Special Requests   Final    BOTTLES DRAWN AEROBIC AND ANAEROBIC Blood Culture adequate volume   Culture   Final    NO GROWTH 5 DAYS Performed at [facility], [address] [place], [place] [address]    Report Status 03/22/2020 FINAL  Final  Culture, blood (Routine X 2) w Reflex to ID Panel     Status: None   Collection Time: 03/17/20  9:00 PM   Specimen: BLOOD RIGHT FOREARM  Result Value Ref Range Status   Specimen Description BLOOD RIGHT FOREARM  Final   Special Requests   Final    BOTTLES DRAWN AEROBIC AND ANAEROBIC Blood Culture adequate volume   Culture   Final    NO GROWTH 5 DAYS Performed at [facility], [address] [place], [place] [address]    Report Status 03/22/2020 FINAL  Final     Labs: Basic Metabolic Panel: Recent Labs  Lab 03/16/20 0716 03/17/20 0500 03/17/20 1906 03/18/20 0404 03/18/20 1355 03/19/20 0453 03/20/20 0457 03/21/20 0806 03/22/20 0322  NA 134* 131*   < > 131*  --  131* 134* 136 137  K 3.7 3.6   < > 3.5  --  2.8* 3.7 4.1 4.6  CL 99 95*   < > 91*  --  92* 95* 98 97*  CO2 27 26   < > 26  --  26 27 30 27   GLUCOSE 240* 189*   < > 324* 596* 216* 286* 207* 185*  BUN 50* 58*   < > 69*  --  86* 85* 65* 57*  CREATININE 1.39* 1.43*   < > 1.60*  --  1.75* 1.73* 1.34* 1.21*  CALCIUM 8.1* 8.3*   < > 8.3*  --  8.5* 8.4* 8.8* 8.9  MG 1.9 1.8  --   --   --  2.1  --   --   --   PHOS 4.4  --   --   --   --   --   --   --   --    < > = values in this interval not displayed.   Liver Function Tests: Recent Labs  Lab 03/18/20 0404 03/19/20 0453 03/20/20 0457 03/21/20 0806 03/22/20 0322  AST 35 55* 55*  68* 70*  ALT 69* 83* 97* 128* 131*  ALKPHOS 224* 250* 256* 284* 238*  BILITOT 0.6 0.5 0.7 0.7 0.9  PROT 7.1 6.6 6.8 7.6 6.5  ALBUMIN 2.4* 2.4* 2.3* 2.9* 2.5*   CBC: Recent Labs  Lab 03/18/20 0404 03/19/20 0453 03/20/20 0457 03/21/20 0806 03/22/20 0322  WBC 15.9* 16.2* 10.5 12.4* 12.4*  NEUTROABS 14.8* 13.2* 9.2* 10.5* 10.6*  HGB 13.9 13.5 13.6 14.8 13.4  HCT 43.9 41.6 43.0 47.3* 43.7  MCV 77.6* 77.6* 78.3* 79.8* 80.2  PLT 379 373 377 385 295   BNP (last 3 results) Recent Labs    03/11/20 1358 03/15/20 0400  BNP 68.0 59.0   CBG: Recent Labs  Lab 03/21/20 2039 03/21/20 2359 03/22/20 0445 03/22/20 0840 03/22/20 1106  GLUCAP 282* 199* 215* 221* 342*    Signed:  Barton Dubois MD.  [facility] 03/22/2020, 2:10 PM

## 2020-03-22 NOTE — Progress Notes (Signed)
Nsg Discharge Note  Admit Date:  03/17/2020 Discharge date: 03/22/2020   Stephanie [name] to be D/C'd Home per MD order.  AVS completed.  Patient able to verbalize understanding.  Discharge Medication: Allergies as of 03/22/2020      Reactions   Tetracyclines & Related Anaphylaxis   Banana Hives, Nausea And Vomiting   Penicillins Rash, Other (See Comments)   Has patient had a PCN reaction causing immediate rash, facial/tongue/throat swelling, SOB or lightheadedness with hypotension: No Has patient had a PCN reaction causing severe rash involving mucus membranes or skin necrosis: No Has patient had a PCN reaction that required hospitalization No Has patient had a PCN reaction occurring within the last 10 years: No If all of the above answers are "NO", then may proceed with Cephalosporin use.      Medication List    STOP taking these medications   diclofenac sodium 1 % Gel Commonly known as: VOLTAREN     TAKE these medications   Accu-Chek FastClix Lancets Misc Apply topically.   acetaminophen 500 MG tablet Commonly known as: TYLENOL Take 1,000 mg by mouth every 6 (six) hours as needed for moderate pain or headache.   albuterol 108 (90 Base) MCG/ACT inhaler Commonly known as: VENTOLIN HFA Inhale 2 puffs into the lungs every 6 (six) hours as needed for wheezing or shortness of breath.   apixaban 5 MG Tabs tablet Commonly known as: ELIQUIS Take 10 mg by mouth BID through Friday 5/19 evening. Take 59m by mouth BID starting 5/20 AM. What changed:   how much to take  how to take this  when to take this  additional instructions   Arnuity Ellipta 100 MCG/ACT Aepb Generic drug: Fluticasone Furoate Inhale 1 puff into the lungs at bedtime.   cetirizine 10 MG tablet Commonly known as: ZYRTEC Take 10 mg by mouth daily.   cimetidine 200 MG tablet Commonly known as: TAGAMET Take 0.5 tablets (100 mg total) by mouth daily as needed.   CINNAMON PO Take 2-3 capsules by mouth 2 (two)  times daily as needed (high blood sugar).   clobetasol ointment 0.05 % Commonly known as: TEMOVATE Apply 1 application topically 3 (three) times daily as needed (imflammation).   Cod Liver Oil Caps Take 1 capsule by mouth at bedtime.   diphenhydrAMINE 25 MG tablet Commonly known as: BENADRYL Take 25 mg by mouth every 6 (six) hours as needed for itching.   Doxepin HCl 5 % Crea Apply 2 g topically 3 (three) times daily as needed (itching).   DULoxetine 30 MG capsule Commonly known as: CYMBALTA Take 1 capsule (30 mg total) by mouth 2 (two) times daily.   fluticasone 27.5 MCG/SPRAY nasal spray Commonly known as: VERAMYST Place 2 sprays into the nose daily.   guaiFENesin 100 MG/5ML Soln Commonly known as: ROBITUSSIN Take 5 mLs (100 mg total) by mouth every 4 (four) hours as needed for cough or to loosen phlegm.   HumaLOG KwikPen 100 UNIT/ML KwikPen Generic drug: insulin lispro Inject 30-47 Units into the skin See admin instructions. If blood sugar is  90-150= 45 units, 150-200= 46 units. 200-250= 47 units at breakfast and supper Lunchtime: If BS is  90-150=30 units,150- 200=31 units,200-250 units 32   hydrOXYzine 25 MG tablet Commonly known as: ATARAX/VISTARIL Take 25 mg by mouth every 8 (eight) hours as needed for itching.   levothyroxine 88 MCG tablet Commonly known as: SYNTHROID Take 88 mcg by mouth daily before breakfast.   lubiprostone 8 MCG capsule  Commonly known as: Amitiza Take 1 capsule (8 mcg total) by mouth 2 (two) times daily as needed for constipation.   Lyrica 75 MG capsule Generic drug: pregabalin Take 75 mg by mouth 2 (two) times daily.   methocarbamol 500 MG tablet Commonly known as: Robaxin Take 1 tablet (500 mg total) by mouth 3 (three) times daily as needed. What changed: reasons to take this   metolazone 5 MG tablet Commonly known as: ZAROXOLYN Take 2 tablets (10 mg total) by mouth at bedtime.   metoprolol tartrate 25 MG tablet Commonly known  as: LOPRESSOR Take 1-1.5 tablets (25-37.5 mg total) by mouth See admin instructions. Take 37.5 mg in the morning and 25 mg at night   omega-3 acid ethyl esters 1 g capsule Commonly known as: LOVAZA Take 1 g by mouth in the morning, at noon, in the evening, and at bedtime.   pantoprazole 40 MG tablet Commonly known as: PROTONIX Take 1 tablet (40 mg total) by mouth 2 (two) times daily before a meal.   potassium chloride 10 MEQ tablet Commonly known as: KLOR-CON Take 10 mEq by mouth daily.   pravastatin 80 MG tablet Commonly known as: PRAVACHOL Take 1 tablet (80 mg total) by mouth at bedtime.   predniSONE 20 MG tablet Commonly known as: DELTASONE Take 3 tablets by mouth daily x2 days; then 2 tablet by mouth daily x2 days; then 1 tablet by mouth daily x3 days; then half tablet by mouth daily x3 days and stop prednisone. What changed:   medication strength  See the new instructions.   torsemide 20 MG tablet Commonly known as: DEMADEX Take 2 tablets (40 mg total) by mouth daily. What changed:   how much to take  when to take this  reasons to take this   traMADol 50 MG tablet Commonly known as: ULTRAM Take 50 mg by mouth 3 (three) times daily as needed for moderate pain.   traZODone 50 MG tablet Commonly known as: DESYREL Take 50 mg by mouth at bedtime.   vitamin B-12 1000 MCG tablet Commonly known as: CYANOCOBALAMIN Take 1,000 mcg by mouth daily.   vitamin C 1000 MG tablet Take 1,000 mg by mouth daily.   Vitamin D (Ergocalciferol) 1.25 MG (50000 UNIT) Caps capsule Commonly known as: DRISDOL Take 50,000 Units by mouth every 7 (seven) days.   Vitamin D 50 MCG (2000 UT) tablet Take 4,000 Units by mouth daily.   vitamin E 200 UNIT capsule Take 200 Units by mouth daily.   Xultophy 100-3.6 UNIT-MG/ML Sopn Generic drug: Insulin Degludec-Liraglutide Inject 85 Units as directed at bedtime. What changed: how much to take       Discharge Assessment: Vitals:    03/22/20 1358 03/22/20 1425  BP: 111/70   Pulse: 86   Resp: 16   Temp: 97.7 F (36.5 C)   SpO2: 99% 95%   Skin clean, dry and intact without evidence of skin break down, no evidence of skin tears noted. IV catheter discontinued intact. Site without signs and symptoms of complications - no redness or edema noted at insertion site, patient denies c/o pain - only slight tenderness at site.  Dressing with slight pressure applied.  D/c Instructions-Education: Discharge instructions given to patient verbalized understanding. D/c education completed with including  instructions, medication list, d/c activities limitations if indicated, with other d/c instructions as indicated by MD - patient able to verbalize understanding, all questions fully answered. Patient instructed to return to ED, call 911, or call MD for  any changes in condition.  Patient escorted via [place], and D/C home via private auto.  Berton Bon, RN 03/22/2020 2:45 PM

## 2020-04-10 DIAGNOSIS — G894 Chronic pain syndrome: Secondary | ICD-10-CM | POA: Insufficient documentation

## 2020-04-30 ENCOUNTER — Telehealth: Payer: Self-pay | Admitting: Internal Medicine

## 2020-04-30 ENCOUNTER — Other Ambulatory Visit (HOSPITAL_COMMUNITY)
Admission: RE | Admit: 2020-04-30 | Discharge: 2020-04-30 | Disposition: A | Discharge status: Home / Self Care | Payer: Medicare Other | Source: Ambulatory Visit | Attending: Family Medicine | Admitting: Family Medicine

## 2020-04-30 ENCOUNTER — Other Ambulatory Visit (HOSPITAL_COMMUNITY): Payer: Self-pay | Admitting: Family Medicine

## 2020-04-30 ENCOUNTER — Other Ambulatory Visit (HOSPITAL_COMMUNITY)
Admission: RE | Admit: 2020-04-30 | Discharge: 2020-04-30 | Disposition: A | Discharge status: Home / Self Care | Payer: Medicare Other | Source: Ambulatory Visit | Attending: Gastroenterology | Admitting: Gastroenterology

## 2020-04-30 ENCOUNTER — Other Ambulatory Visit: Payer: Self-pay

## 2020-04-30 ENCOUNTER — Ambulatory Visit (HOSPITAL_COMMUNITY)
Admission: RE | Admit: 2020-04-30 | Discharge: 2020-04-30 | Disposition: A | Discharge status: Home / Self Care | Payer: Medicare Other | Source: Ambulatory Visit | Attending: Family Medicine | Admitting: Family Medicine

## 2020-04-30 DIAGNOSIS — J1282 Pneumonia due to coronavirus disease 2019: Secondary | ICD-10-CM

## 2020-04-30 DIAGNOSIS — K760 Fatty (change of) liver, not elsewhere classified: Secondary | ICD-10-CM | POA: Insufficient documentation

## 2020-04-30 DIAGNOSIS — U071 COVID-19: Secondary | ICD-10-CM | POA: Insufficient documentation

## 2020-04-30 DIAGNOSIS — R748 Abnormal levels of other serum enzymes: Secondary | ICD-10-CM | POA: Insufficient documentation

## 2020-04-30 LAB — COMPREHENSIVE METABOLIC PANEL
ALT: 34 U/L (ref 0–44)
AST: 31 U/L (ref 15–41)
Albumin: 3 g/dL — ABNORMAL LOW (ref 3.5–5.0)
Alkaline Phosphatase: 257 U/L — ABNORMAL HIGH (ref 38–126)
Anion gap: 9 (ref 5–15)
BUN: 17 mg/dL (ref 8–23)
CO2: 23 mmol/L (ref 22–32)
Calcium: 9.1 mg/dL (ref 8.9–10.3)
Chloride: 104 mmol/L (ref 98–111)
Creatinine, Ser: 1.01 mg/dL — ABNORMAL HIGH (ref 0.44–1.00)
GFR, Estimated: >60 mL/min (ref >60)
Glucose, Bld: 160 mg/dL — ABNORMAL HIGH (ref 70–99)
Potassium: 3.9 mmol/L (ref 3.5–5.1)
Sodium: 136 mmol/L (ref 135–145)
Total Bilirubin: 0.6 mg/dL (ref 0.3–1.2)
Total Protein: 7.7 g/dL (ref 6.5–8.1)

## 2020-04-30 LAB — CBC WITH DIFFERENTIAL/PLATELET
Abs Immature Granulocytes: 0.22 10*3/uL — ABNORMAL HIGH (ref 0.00–0.07)
Basophils Absolute: 0.1 10*3/uL (ref 0.0–0.1)
Basophils Relative: 0 %
Eosinophils Absolute: 0.6 10*3/uL — ABNORMAL HIGH (ref 0.0–0.5)
Eosinophils Relative: 3 %
HCT: 35.9 % — ABNORMAL LOW (ref 36.0–46.0)
Hemoglobin: 11.2 g/dL — ABNORMAL LOW (ref 12.0–15.0)
Immature Granulocytes: 1 %
Lymphocytes Relative: 10 %
Lymphs Abs: 1.9 10*3/uL (ref 0.7–4.0)
MCH: 27.3 pg (ref 26.0–34.0)
MCHC: 31.2 g/dL (ref 30.0–36.0)
MCV: 87.3 fL (ref 80.0–100.0)
Monocytes Absolute: 1.5 10*3/uL — ABNORMAL HIGH (ref 0.1–1.0)
Monocytes Relative: 7 %
Neutro Abs: 15.9 10*3/uL — ABNORMAL HIGH (ref 1.7–7.7)
Neutrophils Relative %: 79 %
Platelets: 212 10*3/uL (ref 150–400)
RBC: 4.11 MIL/uL (ref 3.87–5.11)
RDW: 16.9 % — ABNORMAL HIGH (ref 11.5–15.5)
WBC: 20.1 10*3/uL — ABNORMAL HIGH (ref 4.0–10.5)
nRBC: 0 % (ref 0.0–0.2)

## 2020-04-30 NOTE — Telephone Encounter (Signed)
Noted. Labs from 02/2020 faxed to [facility].

## 2020-04-30 NOTE — Telephone Encounter (Signed)
PATIENT IS GOING TO [facility] TO HAVE LABS AND SAID THAT SHE IS SUPPOSED TO HAVE SOME DONE FOR THIS OFFICE, PLEASE FAX HER LAB ORDER TO [facility]

## 2020-05-01 LAB — MITOCHONDRIAL ANTIBODIES: Mitochondrial M2 Ab, IgG: <20 Units (ref 0.0–20.0)

## 2020-05-01 LAB — ANA: Anti Nuclear Antibody (ANA): POSITIVE — AB

## 2020-05-01 LAB — IGG, IGA, IGM
IgA: 242 mg/dL (ref 87–352)
IgG (Immunoglobin G), Serum: 1367 mg/dL (ref 586–1602)
IgM (Immunoglobulin M), Srm: 106 mg/dL (ref 26–217)

## 2020-05-01 LAB — ANTI-SMOOTH MUSCLE ANTIBODY, IGG: F-Actin IgG: 21 Units — ABNORMAL HIGH (ref 0–19)

## 2020-05-22 ENCOUNTER — Institutional Professional Consult (permissible substitution): Payer: Medicare Other | Admitting: Internal Medicine

## 2020-05-22 NOTE — Progress Notes (Deleted)
KIEREN RICCI, female    DOB: Aug 01, 1950    MRN: [id number]   Brief patient profile:    Admit date:03/17/2020 Discharge date:03/22/2020     Recommendations for Outpatient Follow-up: 1. Repeat basic metabolic panel to evaluate lites and renal function 2. Reassess blood pressure and adjust antihypertensive treatment as needed 3. Continue to closely follow CBGs and adjust hypoglycemic regimen as required 4. Pleasereassess the need foroxygen supplementation. 5. RepeatCXRin 6 weeks to assure complete resolution of infiltrates.   Discharge Diagnoses: Principal Problem: Acute respiratory disease due to COVID-19 virus Active Problems: Hypertension COPD (chronic obstructive pulmonary disease) (HCC) MGUS (monoclonal gammopathy of unknown significance) Pneumonia due to COVID-19 virus [id number] Chronic diastolic CHF (congestive heart failure) ([place])   Discharge Condition:Stable and improved. Discharged home with instruction to follow-up with PCP in 2 weeks.  CODE STATUS:Full code  Diet recommendation:Heart healthy modified to 100 diet.      Filed Weights   03/17/20 1902 03/18/20 0430  Weight: 103 kg 101.1 kg    History of present illness: NIOBE DICK a 70 y.o.femalewith medical history significant ofCovid pneumonia just discharged today to home from the hospital after completing remdesivir on steroids. Patient normally is on 2 L of oxygen and was discharged on 6 L with exertion but at rest was only requiring 4 L. She was transferred by ambulance home by the time she got home she was extremely short of breath and hypoxic. Patient was readmitted for acute hypoxemic respiratory failure in the setting of Covid pneumonia. Pulmonology following.  Hospital Course: Acute hypoxemic respiratory failure secondary to COVID-19 pneumonia -Continue steroids and wean off O2 supplementation as tolerated.Patient was last discharged with oxygen  supplementation4L. -currently using 2L at rest and 3.5-4L on exertion -continue slow steroids tapering. -Procalcitonin low, not needing antibiotics currently -Continue breathing treatments as needed -Appreciate pulmonology consultationand rec's  Acute COPD exacerbation related to above -IV steroids and breathing treatments as noted above -no wheezing on exam.  Hypokalemia -Repleted -safe to resume diuretics and daily supplementation. -follow electrolytes trend  Type 2 diabetes with hyperglycemia-improved -Resume home adjusted hypoglycemic regimen -advise to follow low carb diet. -Recent A1c of 9.2% -expecting improvement in her CBG's while tapering off steroids.   CKD stage IIIa with AKI -Improved and back to baseline at discharge -Safe to resume the use of metolazone and Demadex -Continue to maintain adequate hydration -Repeat basic metabolic panelatfollow-up visit to assess electrolytes and renal function and stability.  Hypertension -stable -follow heart healthy diet. -Resume home antihypertensive agents.  Hypothyroidism -Continue Synthroid  Class 1 Obesity -low calorie diet, portion control and increase physical activity recommended.  -Body mass index is 33.89 kg/m.  Chronic diastolic heart failure -Stable and compensated -Continue to follow daily weights, low-sodium diet and resume home diuretic regimen.        History of Present Illness  05/22/2020  Pulmonary/ 1st office eval/ [facility] / [address] [facility]  No chief complaint on file.    Dyspnea:  *** Cough: *** Sleep: *** SABA use:   Past Medical History:  Diagnosis Date  . Anemia   . Arthritis   . Asthma   . COPD (chronic obstructive pulmonary disease) ([place])   . Deep vein thrombosis (DVT) of both lower extremities ([place]) 06/27/2015  . Diabetes mellitus   . Fibromyalgia   . GERD (gastroesophageal reflux disease)   . H/O hiatal hernia   . Hypercholesteremia   . Hypertension   .  Hyperthyroidism   . IBS (irritable bowel syndrome)   .  Inner ear disease   . 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 PONV (postoperative nausea and vomiting)   . Tachycardia     Outpatient Medications Prior to Visit  Medication Sig Dispense Refill  . Accu-Chek FastClix Lancets MISC Apply topically.    Marland Kitchen acetaminophen (TYLENOL) 500 MG tablet Take 1,000 mg by mouth every 6 (six) hours as needed for moderate pain or headache.     . albuterol (PROVENTIL HFA;VENTOLIN HFA) 108 (90 BASE) MCG/ACT inhaler Inhale 2 puffs into the lungs every 6 (six) hours as needed for wheezing or shortness of breath.     Marland Kitchen apixaban (ELIQUIS) 5 MG TABS tablet Take 10 mg by mouth BID through Friday 5/19 evening. Take 69m by mouth BID starting 5/20 AM. (Patient taking differently: Take 5 mg by mouth 2 (two) times daily.) 60 tablet 3  . ARNUITY ELLIPTA 100 MCG/ACT AEPB Inhale 1 puff into the lungs at bedtime.   11  . Ascorbic Acid (VITAMIN C) 1000 MG tablet Take 1,000 mg by mouth daily.     . cetirizine (ZYRTEC) 10 MG tablet Take 10 mg by mouth daily.    . Cholecalciferol (VITAMIN D) 2000 units tablet Take 4,000 Units by mouth daily.     . cimetidine (TAGAMET) 200 MG tablet Take 0.5 tablets (100 mg total) by mouth daily as needed.    .Marland KitchenCINNAMON PO Take 2-3 capsules by mouth 2 (two) times daily as needed (high blood sugar).     . clobetasol ointment (TEMOVATE) 00.17% Apply 1 application topically 3 (three) times daily as needed (imflammation).     .Marland KitchenCod Liver Oil CAPS Take 1 capsule by mouth at bedtime.     . diphenhydrAMINE (BENADRYL) 25 MG tablet Take 25 mg by mouth every 6 (six) hours as needed for itching.    . Doxepin HCl 5 % CREA Apply 2 g topically 3 (three) times daily as needed (itching).   5  . DULoxetine (CYMBALTA) 30 MG capsule Take 1 capsule (30 mg total) by mouth 2 (two) times daily. 30 capsule -0  . fluticasone (VERAMYST) 27.5 MCG/SPRAY nasal spray Place 2 sprays into the nose  daily.     .Marland KitchenguaiFENesin (ROBITUSSIN) 100 MG/5ML SOLN Take 5 mLs (100 mg total) by mouth every 4 (four) hours as needed for cough or to loosen phlegm. 236 mL 0  . HUMALOG KWIKPEN 100 UNIT/ML KiwkPen Inject 30-47 Units into the skin See admin instructions. If blood sugar is  90-150= 45 units, 150-200= 46 units. 200-250= 47 units at breakfast and supper Lunchtime: If BS is  90-150=30 units,150- 200=31 units,200-250 units 32  5  . hydrOXYzine (ATARAX/VISTARIL) 25 MG tablet Take 25 mg by mouth every 8 (eight) hours as needed for itching.     . levothyroxine (SYNTHROID) 88 MCG tablet Take 88 mcg by mouth daily before breakfast.    . lubiprostone (AMITIZA) 8 MCG capsule Take 1 capsule (8 mcg total) by mouth 2 (two) times daily as needed for constipation. 60 capsule 5  . LYRICA 75 MG capsule Take 75 mg by mouth 2 (two) times daily.     . methocarbamol (ROBAXIN) 500 MG tablet Take 1 tablet (500 mg total) by mouth 3 (three) times daily as needed. (Patient taking differently: Take 500 mg by mouth 3 (three) times daily as needed for muscle spasms.) 60 tablet 1  . metolazone (ZAROXOLYN) 5 MG tablet Take 2 tablets (10 mg total) by mouth at bedtime.    .Marland Kitchen  metoprolol tartrate (LOPRESSOR) 25 MG tablet Take 1-1.5 tablets (25-37.5 mg total) by mouth See admin instructions. Take 37.5 mg in the morning and 25 mg at night    . omega-3 acid ethyl esters (LOVAZA) 1 g capsule Take 1 g by mouth in the morning, at noon, in the evening, and at bedtime.    . pantoprazole (PROTONIX) 40 MG tablet Take 1 tablet (40 mg total) by mouth 2 (two) times daily before a meal. 180 tablet 3  . potassium chloride (KLOR-CON) 10 MEQ tablet Take 10 mEq by mouth daily.    . pravastatin (PRAVACHOL) 80 MG tablet Take 1 tablet (80 mg total) by mouth at bedtime. 30 tablet 0  . predniSONE (DELTASONE) 20 MG tablet Take 3 tablets by mouth daily x2 days; then 2 tablet by mouth daily x2 days; then 1 tablet by mouth daily x3 days; then half tablet by mouth  daily x3 days and stop prednisone. 12 tablet 0  . torsemide (DEMADEX) 20 MG tablet Take 2 tablets (40 mg total) by mouth daily. (Patient taking differently: Take 10-20 mg by mouth 2 (two) times daily as needed (swelling).) 60 tablet 0  . traMADol (ULTRAM) 50 MG tablet Take 50 mg by mouth 3 (three) times daily as needed for moderate pain.   1  . traZODone (DESYREL) 50 MG tablet Take 50 mg by mouth at bedtime.    . vitamin B-12 (CYANOCOBALAMIN) 1000 MCG tablet Take 1,000 mcg by mouth daily.    . Vitamin D, Ergocalciferol, (DRISDOL) 1.25 MG (50000 UNIT) CAPS capsule Take 50,000 Units by mouth every 7 (seven) days.    . vitamin E 200 UNIT capsule Take 200 Units by mouth daily.     . XULTOPHY 100-3.6 UNIT-MG/ML SOPN Inject 85 Units as directed at bedtime.  0   No facility-administered medications prior to visit.     Objective:     There were no vitals taken for this visit.         Assessment   No problem-specific Assessment & Plan notes found for this encounter.     Christinia Gully, MD 05/22/2020

## 2020-06-18 ENCOUNTER — Other Ambulatory Visit: Payer: Self-pay

## 2020-06-18 ENCOUNTER — Encounter: Payer: Self-pay | Admitting: Internal Medicine

## 2020-06-18 ENCOUNTER — Ambulatory Visit (INDEPENDENT_AMBULATORY_CARE_PROVIDER_SITE_OTHER): Payer: Medicare Other | Admitting: Internal Medicine

## 2020-06-18 DIAGNOSIS — J069 Acute upper respiratory infection, unspecified: Secondary | ICD-10-CM | POA: Diagnosis not present

## 2020-06-18 DIAGNOSIS — U071 COVID-19: Secondary | ICD-10-CM | POA: Diagnosis not present

## 2020-06-18 DIAGNOSIS — R0600 Dyspnea, unspecified: Secondary | ICD-10-CM | POA: Diagnosis not present

## 2020-06-18 DIAGNOSIS — R0609 Other forms of dyspnea: Secondary | ICD-10-CM | POA: Insufficient documentation

## 2020-06-18 MED ORDER — BISOPROLOL FUMARATE 5 MG PO TABS: ORAL_TABLET | ORAL | 11 refills | Status: DC

## 2020-06-18 NOTE — Patient Instructions (Addendum)
Stop metaprolol   Bisoprolol 5 mg twice daily - call med to fine tune it.   Change pantoprazole 40 mg Take 30- 60 min before your first and last meals of the day   GERD (REFLUX)  is an extremely common cause of respiratory symptoms just like yours , many times with no obvious heartburn at all.    It can be treated with medication, but also with lifestyle changes including elevation of the head of your bed (ideally with 6 -8inch blocks under the headboard of your bed),  Smoking cessation, avoidance of late meals, excessive alcohol, and avoid fatty foods, chocolate, peppermint, colas, red wine, and acidic juices such as orange juice.  NO MINT OR MENTHOL PRODUCTS SO NO COUGH DROPS  USE SUGARLESS CANDY INSTEAD (Jolley ranchers or Stover's or Life Savers) or even ice chips will also do - the key is to swallow to prevent all throat clearing. NO OIL BASED VITAMINS - use powdered substitutes.  Avoid fish oil when coughing.  Only use your albuterol as a rescue medication to be used if you can't catch your breath by resting or doing a relaxed purse lip breathing pattern.  - The less you use it, the better it will work when you need it. - Ok to use up to 2 puffs  every 4 hours if you must but call for immediate appointment if use goes up over your usual need - Don't leave home without it !!  (think of it like the spare tire for your car)   Make sure you check your oxygen saturation  at your highest level of activity  to be sure it stays over 90% and adjust  02 flow upward to maintain this level if needed but remember to turn it back to previous settings when you stop (to conserve your supply).    Please schedule a follow up office visit in 2 weeks, call sooner if needed with all medications /inhalers/ solutions in hand so we can verify exactly what you are taking. This includes all medications from all doctors and over the [place] separate them into two bags:  the ones you take automatically, no  matter what, vs the ones you take just when you feel you need them "BAG #2 is UP TO YOU"  - this will really help [place] help you take your medications more effectively.

## 2020-06-18 NOTE — Assessment & Plan Note (Addendum)
-   no desats walking in office 06/18/2020 on 2lpm  > rec stay on 02 for now until sort out cardiac issues   Each maintenance medication was reviewed in detail including emphasizing most importantly the difference between maintenance and prns and under what circumstances the prns are to be triggered using an action plan format where appropriate.  Total time for H and P, chart review, counseling,  directly observing portions of ambulatory 02 saturation study/ and generating customized AVS unique to this office visit / same day charting  > 60 min

## 2020-06-18 NOTE — Progress Notes (Signed)
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 Repeat basic metabolic panel to evaluate lites and renal function 2. Reassess blood pressure and adjust antihypertensive treatment as needed 3. Continue to closely follow CBGs and adjust hypoglycemic regimen as required 4. Pleasereassess the need foroxygen supplementation. 5. RepeatCXRin 6 weeks to assure complete resolution of infiltrates.   Discharge Diagnoses: Principal Problem: Acute respiratory disease due to COVID-19 virus Active Problems: Hypertension COPD (chronic obstructive pulmonary disease) (HCC) MGUS (monoclonal gammopathy of unknown significance) Pneumonia due to COVID-19 virus [id number] Chronic diastolic CHF (congestive heart failure) ([place])   Discharge Condition:Stable and improved. Discharged home with instruction to follow-up with PCP in 2 weeks.  CODE STATUS:Full code  Diet recommendation:Heart healthy modified to 100 diet.      Filed Weights   03/17/20 1902 03/18/20 0430  Weight: 103 kg 101.1 kg    History of present illness: Stephanie [name] a 69 y.o.femalewith medical history significant ofCovid pneumonia just discharged today to home from the hospital after completing remdesivir on steroids. Patient normally is on 2 L of oxygen and was discharged on 6 L with exertion but at rest was only requiring 4 L. She was transferred by ambulance home by the time she got home she was extremely short of breath and hypoxic. Patient was readmitted for acute hypoxemic respiratory failure in the setting of Covid pneumonia. Pulmonology  following.  Hospital Course: Acute hypoxemic respiratory failure secondary to COVID-19 pneumonia -Continue steroids and wean off O2 supplementation as tolerated.   -currently using 2L at rest and 3.5-4L on exertion -continue slow steroids tapering. -Procalcitonin low, not needing antibiotics currently -Continue breathing treatments as needed -Appreciate pulmonology consultationand rec's  Acute COPD exacerbation related to above -IV steroids and breathing treatments as noted above -no wheezing on exam.  Hypokalemia -Repleted -safe to resume diuretics and daily supplementation. -follow electrolytes trend  Type 2 diabetes with hyperglycemia-improved -Resume home adjusted hypoglycemic regimen -advise to follow low carb diet. -Recent A1c of 9.2% -expecting improvement in her CBG's while tapering off steroids.   CKD stage IIIa with AKI -Improved and back to baseline at discharge -Safe to resume the use of metolazone and Demadex -Continue to maintain adequate hydration -Repeat basic metabolic panelatfollow-up visit to assess electrolytes and renal function and stability.  Hypertension -stable -follow heart healthy diet. -Resume home antihypertensive agents.  Hypothyroidism -Continue Synthroid  Class 1 Obesity -low calorie diet, portion control and increase physical activity recommended.  -Body mass index is 33.89 kg/m.  Chronic diastolic heart failure -Stable and compensated -Continue to follow daily weights, low-sodium diet and resume home diuretic regimen.      History of Present Illness  06/18/2020  Pulmonary/ 1st office eval/ Domnick Chervenak / [facility] [place] [facility]  Chief Complaint  Patient presents with  . Pulmonary Consult    Referred by Dr Manuella Ghazi.  Pt had covid 06 Mar 2020- admitted to [facility] and sent home with supplemental o2. She gets winded with exertion such as cooking and cleaning her home. She uses her albuterol inhaler 2 x daily on average.   Dyspnea:  Room  to room at home / scooter  for shopping due to back > breathing on 2lpm  Cough: worse since covid / prednisone rx per PCP x 4 more days Sleep: flat bed with 3 pillows worse cough  SABA use: twice daily as above    No obvious day to day or daytime variability or assoc excess/ purulent sputum or mucus plugs or hemoptysis or cp or chest tightness, subjective wheeze or overt sinus or hb symptoms.    Also denies any obvious fluctuation of symptoms with weather or environmental changes or other aggravating or alleviating factors except as outlined above   No unusual exposure hx or h/o childhood pna  or knowledge of premature birth.  Current Allergies, Complete Past Medical History, Past Surgical History, Family History, and Social History were reviewed in [employer] record.  ROS  The following are not active complaints unless bolded Hoarseness, sore throat, dysphagia, dental problems, itching, sneezing(despite rx with atarax and zyrtec and prn benadryl,  nasal congestion or discharge of excess mucus or purulent secretions, ear ache,   fever, chills, sweats, unintended wt loss or wt gain, classically pleuritic or exertional cp,  orthopnea pnd or arm/hand swelling  or leg swelling, presyncope, palpitations, abdominal pain, anorexia, nausea, vomiting, diarrhea  or change in bowel habits or change in bladder habits, change in stools or change in urine, dysuria, hematuria,  rash, arthralgias, visual complaints, headache, numbness, weakness or ataxia or problems with walking or coordination,  change in mood or  memory.           Past Medical History:  Diagnosis Date  . Anemia   . Arthritis   . Asthma   . COPD (chronic obstructive pulmonary disease) ([place])   . Deep vein thrombosis (DVT) of both lower extremities ([place]) 06/27/2015  . Diabetes mellitus   . Fibromyalgia   . GERD (gastroesophageal reflux disease)   . H/O hiatal hernia   . Hypercholesteremia   . Hypertension   .  Hyperthyroidism   . IBS (irritable bowel syndrome)   . Inner ear disease   . 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 PONV (postoperative nausea and vomiting)   . Tachycardia     Outpatient Medications Prior to Visit - - NOTE:   Unable to verify as accurately reflecting what pt takes     Medication Sig Dispense Refill  . Accu-Chek FastClix Lancets MISC Apply topically.    Marland Kitchen acetaminophen (TYLENOL) 500 MG tablet Take 1,000 mg by mouth every 6 (six) hours as needed for moderate pain or headache.     . albuterol (PROVENTIL HFA;VENTOLIN HFA) 108 (90 BASE) MCG/ACT inhaler Inhale 2 puffs into the lungs every 6 (six) hours as needed for wheezing or shortness of breath.     Marland Kitchen apixaban (ELIQUIS) 5 MG TABS tablet Take 10 mg by mouth BID through Friday 5/19 evening. Take 46m by mouth BID starting 5/20 AM. (Patient taking differently: Take 5 mg by mouth 2 (two) times daily.) 60 tablet 3  . ARNUITY ELLIPTA 100 MCG/ACT AEPB Inhale 1 puff into the lungs at bedtime.   11  . Ascorbic Acid (VITAMIN C) 1000 MG tablet Take 1,000 mg by mouth daily.     . cetirizine (ZYRTEC) 10 MG tablet Take 10 mg by mouth daily.    . Cholecalciferol (VITAMIN D) 2000 units tablet Take 4,000 Units by mouth daily.     . cimetidine (TAGAMET) 200 MG tablet Take 0.5 tablets (100 mg total) by mouth daily as needed.    .Marland Kitchen  CINNAMON PO Take 2-3 capsules by mouth 2 (two) times daily as needed (high blood sugar).     . clobetasol ointment (TEMOVATE) 2.45 % Apply 1 application topically 3 (three) times daily as needed (imflammation).     Marland Kitchen Cod Liver Oil CAPS Take 1 capsule by mouth at bedtime.     . diphenhydrAMINE (BENADRYL) 25 MG tablet Take 25 mg by mouth every 6 (six) hours as needed for itching.    . Doxepin HCl 5 % CREA Apply 2 g topically 3 (three) times daily as needed (itching).   5  . DULoxetine (CYMBALTA) 30 MG capsule Take 1 capsule (30 mg total) by mouth 2 (two) times daily. 30 capsule -0  .  fluticasone (VERAMYST) 27.5 MCG/SPRAY nasal spray Place 2 sprays into the nose daily.     Marland Kitchen guaiFENesin (ROBITUSSIN) 100 MG/5ML SOLN Take 5 mLs (100 mg total) by mouth every 4 (four) hours as needed for cough or to loosen phlegm. 236 mL 0  . HUMALOG KWIKPEN 100 UNIT/ML KiwkPen Inject 30-47 Units into the skin See admin instructions. If blood sugar is  90-150= 45 units, 150-200= 46 units. 200-250= 47 units at breakfast and supper Lunchtime: If BS is  90-150=30 units,150- 200=31 units,200-250 units 32  5  . hydrOXYzine (ATARAX/VISTARIL) 25 MG tablet Take 25 mg by mouth every 8 (eight) hours as needed for itching.     . levothyroxine (SYNTHROID) 88 MCG tablet Take 88 mcg by mouth daily before breakfast.    . lubiprostone (AMITIZA) 8 MCG capsule Take 1 capsule (8 mcg total) by mouth 2 (two) times daily as needed for constipation. 60 capsule 5  . LYRICA 75 MG capsule Take 75 mg by mouth 2 (two) times daily.     . methocarbamol (ROBAXIN) 500 MG tablet Take 1 tablet (500 mg total) by mouth 3 (three) times daily as needed. (Patient taking differently: Take 500 mg by mouth 3 (three) times daily as needed for muscle spasms.) 60 tablet 1  . metoprolol tartrate (LOPRESSOR) 25 MG tablet Take 1-1.5 tablets (25-37.5 mg total) by mouth See admin instructions. Take 37.5 mg in the morning and 25 mg at night    . omega-3 acid ethyl esters (LOVAZA) 1 g capsule Take 1 g by mouth in the morning, at noon, in the evening, and at bedtime.    . potassium chloride (KLOR-CON) 10 MEQ tablet Take 10 mEq by mouth daily.    . pravastatin (PRAVACHOL) 80 MG tablet Take 1 tablet (80 mg total) by mouth at bedtime. 30 tablet 0  . predniSONE (DELTASONE) 20 MG tablet Take 3 tablets by mouth daily x2 days; then 2 tablet by mouth daily x2 days; then 1 tablet by mouth daily x3 days; then half tablet by mouth daily x3 days and stop prednisone. 12 tablet 0  . torsemide (DEMADEX) 20 MG tablet Take 2 tablets (40 mg total) by mouth daily. (Patient  taking differently: Take 10-20 mg by mouth 2 (two) times daily as needed (swelling).) 60 tablet 0  . traMADol (ULTRAM) 50 MG tablet Take 50 mg by mouth 3 (three) times daily as needed for moderate pain.   1  . traZODone (DESYREL) 50 MG tablet Take 50 mg by mouth at bedtime.    . vitamin B-12 (CYANOCOBALAMIN) 1000 MCG tablet Take 1,000 mcg by mouth daily.    . Vitamin D, Ergocalciferol, (DRISDOL) 1.25 MG (50000 UNIT) CAPS capsule Take 50,000 Units by mouth every 7 (seven) days.    . vitamin  E 200 UNIT capsule Take 200 Units by mouth daily.     . XULTOPHY 100-3.6 UNIT-MG/ML SOPN Inject 85 Units as directed at bedtime.  0  . pantoprazole (PROTONIX) 40 MG tablet Take 1 tablet (40 mg total) by mouth 2 (two) times daily before a meal. 180 tablet 3  . metolazone (ZAROXOLYN) 5 MG tablet Take 2 tablets (10 mg total) by mouth at bedtime.     No facility-administered medications prior to visit.     Objective:     BP (!) 144/80 (BP Location: Left Arm, Cuff Size: Normal)   Pulse (!) 117   Temp (!) 97.1 F (36.2 C) (Temporal)   Ht 5' 8"  (1.727 m)   Wt 223 lb (101.2 kg)   SpO2 100% Comment: on 2lpm cont o2  BMI 33.91 kg/m   SpO2: 100 % (on 2lpm cont o2) O2 Type: Continuous O2 O2 Flow Rate (L/min): 2 L/min   amb mod obese bf nad  - note resting pulse confirmed at 117and irregular    HEENT : pt wearing mask not removed for exam due to covid -19 concerns.    NECK :  without JVD/Nodes/TM/ nl carotid upstrokes bilaterally   LUNGS: no acc muscle use,  Nl contour chest which is clear to A and P bilaterally without cough on insp or exp maneuvers   CV:  RRR  no s3 or murmur or increase in P2, and no edema   ABD:  soft and nontender with nl inspiratory excursion in the supine position. No bruits or organomegaly appreciated, bowel sounds nl  MS:  Nl gait/ ext warm without deformities, calf tenderness, cyanosis or clubbing No obvious joint restrictions   SKIN: warm and dry without lesions     NEURO:  alert, approp, nl sensorium with  no motor or cerebellar deficits apparent.    I personally reviewed images and agree with radiology impression as follows:  CXR:   04/30/20 pa and lat Moderate patchy hazy and reticular opacities in both lungs with a peripheral basilar predominance, worsened at the lung bases in the interval. 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      Assessment   [place] (dyspnea on exertion) Echo 8/83/25  GI  diastolic dysfunction only  -  06/18/2020   Walked    approx   100 ft  @ slow pace  stopped due to  [employer] with sats 99% on 2lpm    Symptoms are markedly disproportionate to objective findings and not clear to what extent this is actually a pulmonary  problem but pt does appear to have difficult to sort out respiratory symptoms of unknown origin for which  DDX  = almost all start with A and  include Adherence, Ace Inhibitors, Acid Reflux, Active Sinus Disease, Alpha 1 Antitripsin deficiency, Anxiety masquerading as Airways dz,  ABPA,  Allergy(esp in young), Aspiration (esp in elderly), Adverse effects of meds,  Active smoking or Vaping, A bunch of PE's/clot burden (a few small clots can't cause this syndrome unless there is already severe underlying pulm or vascular dz with poor reserve),  Anemia or thyroid disorder, plus two Bs  = Bronchiectasis and Beta blocker use..and one C= CHF    Adherence is always the initial "prime suspect" and is a multilayered concern that requires a "trust but verify" approach in every patient - starting with knowing how to use medications, especially inhalers, correctly, keeping up with refills and understanding the fundamental difference between maintenance  and prns vs those medications only taken for a very short course and then stopped and not refilled.  - return in 2 weeks with all meds in hand using a trust but verify approach to confirm accurate Medication  Reconciliation  The principal here is that until we are certain that the  patients are doing what we've asked, it makes no sense to ask them to do more.   To keep things simple, I have asked the patient to first separate medicines that are perceived as maintenance, that is to be taken daily "no matter what", from those medicines that are taken on only on an as-needed basis and I have given the patient examples of both, and then return to see  Me for accurate med reconciliation  ? Acid (or non-acid) GERD > always difficult to exclude as up to 75% of pts in some series report no assoc GI/ Heartburn symptoms> rec continue max (24h)  acid suppression and diet restrictions/ reviewed      ? Anemia/ thryoid dz   Lab Results  Component Value Date   HGB 11.2 (L) 04/30/2020   HGB 13.4 03/22/2020   HGB 14.8 03/21/2020    Lab Results  Component Value Date   TSH 2.715 12/04/2019     ? Allergy/ asthma > finish prednisone then do allergy profile next ov - minimize use of saba if possible and continue arnuity for now pending return for med rec   ? Anxiety/depression/ deconditioning > usually at the bottom of this list of usual suspects but may interfere with adherence and also interpretation of response or lack thereof to symptom management which can be quite subjective.  ? A bunch of PEs > unlikely on eliquis   Beta blocker effects> In the setting of respiratory symptoms of unknown etiology,  It would be preferable to use bystolic, the most beta -1  selective Beta blocker available in sample form, with bisoprolol the most selective generic choice  on the market, at least on a trial basis, to make sure the spillover Beta 2 effects of the less specific Beta blockers are not contributing to this patient's symptoms.  >>> try bisoprolol 5 mg bid and self monitor/ f/u in 2 weeks   ? CHF / cardiac asthma related to rapid afib in pt with diastolic dysfunction   - appears to be a chronic problem per her hx with planned f/u by  cards not until late June 2022 so rec - chang to bisoprolol 5 mg bid instead of increasing subtherapeutic metaprolol  - will ask Dr Harl Bowie to expedite evaluation  - if condition worsens go to ER     Acute respiratory disease due to COVID-19 virus - no desats walking in office 06/18/2020 on 2lpm  > rec stay on 02 for now until sort out cardiac issues   Each maintenance medication was reviewed in detail including emphasizing most importantly the difference between maintenance and prns and under what circumstances the prns are to be triggered using an action plan format where appropriate.  Total time for H and P, chart review, counseling,  directly observing portions of ambulatory 02 saturation study/ and generating customized AVS unique to this office visit / same day charting  > 60 min                    Christinia Gully, MD 06/18/2020

## 2020-06-18 NOTE — Assessment & Plan Note (Addendum)
Echo 8/88/91  GI  diastolic dysfunction only  -  06/18/2020   Walked    approx   100 ft  @ slow pace  stopped due to  Sob with sats 99% on 2lpm    Symptoms are markedly disproportionate to objective findings and not clear to what extent this is actually a pulmonary  problem but pt does appear to have difficult to sort out respiratory symptoms of unknown origin for which  DDX  = almost all start with A and  include Adherence, Ace Inhibitors, Acid Reflux, Active Sinus Disease, Alpha 1 Antitripsin deficiency, Anxiety masquerading as Airways dz,  ABPA,  Allergy(esp in young), Aspiration (esp in elderly), Adverse effects of meds,  Active smoking or Vaping, A bunch of PE's/clot burden (a few small clots can't cause this syndrome unless there is already severe underlying pulm or vascular dz with poor reserve),  Anemia or thyroid disorder, plus two Bs  = Bronchiectasis and Beta blocker use..and one C= CHF    Adherence is always the initial "prime suspect" and is a multilayered concern that requires a "trust but verify" approach in every patient - starting with knowing how to use medications, especially inhalers, correctly, keeping up with refills and understanding the fundamental difference between maintenance and prns vs those medications only taken for a very short course and then stopped and not refilled.  - return in 2 weeks with all meds in hand using a trust but verify approach to confirm accurate Medication  Reconciliation The principal here is that until we are certain that the  patients are doing what we've asked, it makes no sense to ask them to do more.   To keep things simple, I have asked the patient to first separate medicines that are perceived as maintenance, that is to be taken daily "no matter what", from those medicines that are taken on only on an as-needed basis and I have given the patient examples of both, and then return to see  Me for accurate med reconciliation  ? Acid (or non-acid) GERD >  always difficult to exclude as up to 75% of pts in some series report no assoc GI/ Heartburn symptoms> rec continue max (24h)  acid suppression and diet restrictions/ reviewed     ? Anemia/ thryoid dz   Lab Results  Component Value Date   HGB 11.2 (L) 04/30/2020   HGB 13.4 03/22/2020   HGB 14.8 03/21/2020    Lab Results  Component Value Date   TSH 2.715 12/04/2019     ? Allergy/ asthma > finish prednisone then do allergy profile next ov - minimize use of saba if possible and continue arnuity for now pending return for med rec   ? Anxiety/depression/ deconditioning > usually at the bottom of this list of usual suspects but may interfere with adherence and also interpretation of response or lack thereof to symptom management which can be quite subjective.  ? A bunch of PEs > unlikely on eliquis   Beta blocker effects> In the setting of respiratory symptoms of unknown etiology,  It would be preferable to use bystolic, the most beta -1  selective Beta blocker available in sample form, with bisoprolol the most selective generic choice  on the market, at least on a trial basis, to make sure the spillover Beta 2 effects of the less specific Beta blockers are not contributing to this patient's symptoms.  >>> try bisoprolol 5 mg bid and self monitor/ f/u in 2 weeks   ?  CHF / cardiac asthma related to rapid afib in pt with diastolic dysfunction   - appears to be a chronic problem per her hx with planned f/u by cards not until late June 2022 so rec - chang to bisoprolol 5 mg bid instead of increasing subtherapeutic metaprolol  - will ask Dr Harl Bowie to expedite evaluation  - if condition worsens go to ER

## 2020-06-24 ENCOUNTER — Encounter: Payer: Self-pay | Admitting: Cardiology

## 2020-06-24 NOTE — Progress Notes (Signed)
Cardiology Office Note  Date: 06/25/2020   ID: Stephanie [name], DOB [birth year], MRN [id number]  PCP:  [name], FNP  Cardiologist:  Rozann Lesches, MD Electrophysiologist:  None   Chief Complaint  Patient presents with  . Reestablish cardiology follow-up    History of Present Illness: Stephanie [name] is a 70 y.o. female referred for cardiology consultation by Ms. McCorkle NP for evaluation of reported diastolic heart failure.  She is a former patient of Dr. Bronson Ing last seen in 2017, I reviewed the available records.  She was hospitalized in February with COVID-19 pneumonia and associated hypoxic respiratory failure.  She had a recent office visit with Dr. Melvyn Novas, I reviewed the note.  She was switched from metoprolol to bisoprolol.  Question of cardiac contribution to shortness of breath also raised.  Echocardiogram in January revealed LVEF 55 to 60% with mild diastolic dysfunction and normal RV contraction.  History includes reported inappropriate sinus tachycardia.  She presents today stating that she is tolerating the bisoprolol, her heart rate is actually come down somewhat better on this medication.  She states that he gets into the 90s when she is at rest.  She reports no progressive shortness of breath, adjust her diuretics based on leg swelling, weight has been stable.  She usually only uses Demadex 20 mg every other day.  Past Medical History:  Diagnosis Date  . Anemia   . Arthritis   . Asthma   . COPD (chronic obstructive pulmonary disease) ([place])   . COVID-19   . Deep vein thrombosis (DVT) of both lower extremities ([place]) 06/27/2015  . Fibromyalgia   . GERD (gastroesophageal reflux disease)   . H/O hiatal hernia   . Hypercholesteremia   . Hypertension   . Hyperthyroidism   . IBS (irritable bowel syndrome)   . Inappropriate sinus tachycardia   . Inner ear disease   . 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 Type 2 diabetes mellitus ([place])      Past Surgical History:  Procedure Laterality Date  . ABDOMINAL HYSTERECTOMY  partial  . CARPAL TUNNEL RELEASE Right 1991  . CATARACT EXTRACTION W/PHACO Right 05/08/2013   Procedure: CATARACT EXTRACTION PHACO AND INTRAOCULAR LENS PLACEMENT (IOC);  Surgeon: Tonny Branch, MD;  Location: [facility];  Service: Ophthalmology;  Laterality: Right;  CDE 10.31  . CATARACT EXTRACTION W/PHACO Left 08/17/2013   Procedure: CATARACT EXTRACTION PHACO AND INTRAOCULAR LENS PLACEMENT (IOC);  Surgeon: Tonny Branch, MD;  Location: [facility];  Service: Ophthalmology;  Laterality: Left;  CDE:9.03  . CHOLECYSTECTOMY  1971  . COLONOSCOPY WITH PROPOFOL N/A 01/06/2016   Dr. Gala Romney: diverticulosis   . DENTAL SURGERY    . ESOPHAGEAL BRUSHING  08/29/2019   Procedure: ESOPHAGEAL BRUSHING;  Surgeon: Daneil Dolin, MD;  Location: [facility];  Service: Endoscopy;;  . ESOPHAGOGASTRODUODENOSCOPY (EGD) WITH PROPOFOL N/A 01/06/2016   Dr. Gala Romney: normal s/p empiric dilation   . ESOPHAGOGASTRODUODENOSCOPY (EGD) WITH PROPOFOL N/A 08/29/2019   esophageal plaques vs medication residue adherent to tubular esophagus s/p KOH brushing and dilation. Medium-sized hiatal hernia. + for candida. Diflucan.   Marland Kitchen EYE SURGERY    . MALONEY DILATION N/A 01/06/2016   Procedure: Venia Minks DILATION;  Surgeon: Daneil Dolin, MD;  Location: [facility];  Service: Endoscopy;  Laterality: N/A;  . Venia Minks DILATION N/A 08/29/2019   Procedure: Venia Minks DILATION;  Surgeon: Daneil Dolin, MD;  Location: [facility];  Service: Endoscopy;  Laterality: N/A;  . REVERSE SHOULDER  ARTHROPLASTY Right 08/06/2017   Procedure: RIGHT REVERSE SHOULDER ARTHROPLASTY;  Surgeon: Netta Cedars, MD;  Location: [facility];  Service: Orthopedics;  Laterality: Right;  . WRIST GANGLION EXCISION Left     Current Outpatient Medications  Medication Sig Dispense Refill  . Accu-Chek FastClix Lancets MISC Apply topically.    Marland Kitchen acetaminophen (TYLENOL) 500 MG tablet Take 1,000 mg by mouth every  6 (six) hours as needed for moderate pain or headache.     . albuterol (PROVENTIL HFA;VENTOLIN HFA) 108 (90 BASE) MCG/ACT inhaler Inhale 2 puffs into the lungs every 6 (six) hours as needed for wheezing or shortness of breath.     Marland Kitchen apixaban (ELIQUIS) 5 MG TABS tablet Take 10 mg by mouth BID through Friday 5/19 evening. Take 71m by mouth BID starting 5/20 AM. (Patient taking differently: Take 5 mg by mouth 2 (two) times daily.) 60 tablet 3  . ARNUITY ELLIPTA 100 MCG/ACT AEPB Inhale 1 puff into the lungs at bedtime.   11  . Ascorbic Acid (VITAMIN C) 1000 MG tablet Take 1,000 mg by mouth daily.     . bisoprolol (ZEBETA) 5 MG tablet One twice daily 60 tablet 11  . cetirizine (ZYRTEC) 10 MG tablet Take 10 mg by mouth daily.    . Cholecalciferol (VITAMIN D) 2000 units tablet Take 4,000 Units by mouth daily.     . cimetidine (TAGAMET) 200 MG tablet Take 0.5 tablets (100 mg total) by mouth daily as needed.    .Marland KitchenCINNAMON PO Take 2-3 capsules by mouth 2 (two) times daily as needed (high blood sugar).     . clobetasol ointment (TEMOVATE) 07.40% Apply 1 application topically 3 (three) times daily as needed (imflammation).     . Doxepin HCl 5 % CREA Apply 2 g topically 3 (three) times daily as needed (itching).   5  . DULoxetine (CYMBALTA) 30 MG capsule Take 1 capsule (30 mg total) by mouth 2 (two) times daily. 30 capsule -0  . fluticasone (VERAMYST) 27.5 MCG/SPRAY nasal spray Place 2 sprays into the nose daily.     .Marland KitchenguaiFENesin (ROBITUSSIN) 100 MG/5ML SOLN Take 5 mLs (100 mg total) by mouth every 4 (four) hours as needed for cough or to loosen phlegm. 236 mL 0  . HUMALOG KWIKPEN 100 UNIT/ML KiwkPen Inject 30-47 Units into the skin See admin instructions. If blood sugar is  90-150= 45 units, 150-200= 46 units. 200-250= 47 units at breakfast and supper Lunchtime: If BS is  90-150=30 units,150- 200=31 units,200-250 units 32  5  . hydrOXYzine (ATARAX/VISTARIL) 25 MG tablet Take 25 mg by mouth every 8 (eight)  hours as needed for itching.     . levothyroxine (SYNTHROID) 88 MCG tablet Take 88 mcg by mouth daily before breakfast.    . lubiprostone (AMITIZA) 8 MCG capsule Take 1 capsule (8 mcg total) by mouth 2 (two) times daily as needed for constipation. 60 capsule 5  . LYRICA 75 MG capsule Take 75 mg by mouth 2 (two) times daily.     . methocarbamol (ROBAXIN) 500 MG tablet Take 1 tablet (500 mg total) by mouth 3 (three) times daily as needed. (Patient taking differently: Take 500 mg by mouth 3 (three) times daily as needed for muscle spasms.) 60 tablet 1  . pantoprazole (PROTONIX) 40 MG tablet Take 1 tablet (40 mg total) by mouth 2 (two) times daily before a meal. 180 tablet 3  . potassium chloride (KLOR-CON) 10 MEQ tablet Take 10 mEq by mouth daily.    .Marland Kitchen  pravastatin (PRAVACHOL) 80 MG tablet Take 1 tablet (80 mg total) by mouth at bedtime. 30 tablet 0  . predniSONE (DELTASONE) 20 MG tablet Take 3 tablets by mouth daily x2 days; then 2 tablet by mouth daily x2 days; then 1 tablet by mouth daily x3 days; then half tablet by mouth daily x3 days and stop prednisone. 12 tablet 0  . torsemide (DEMADEX) 20 MG tablet Take 2 tablets (40 mg total) by mouth daily. (Patient taking differently: Take 10-20 mg by mouth 2 (two) times daily as needed (swelling).) 60 tablet 0  . traMADol (ULTRAM) 50 MG tablet Take 50 mg by mouth 3 (three) times daily as needed for moderate pain.   1  . traZODone (DESYREL) 50 MG tablet Take 50 mg by mouth at bedtime.    . vitamin B-12 (CYANOCOBALAMIN) 1000 MCG tablet Take 1,000 mcg by mouth daily.    . Vitamin D, Ergocalciferol, (DRISDOL) 1.25 MG (50000 UNIT) CAPS capsule Take 50,000 Units by mouth every 7 (seven) days.    . vitamin E 200 UNIT capsule Take 200 Units by mouth daily.     . XULTOPHY 100-3.6 UNIT-MG/ML SOPN Inject 85 Units as directed at bedtime.  0   No current facility-administered medications for this visit.   Allergies:  Tetracyclines & related, Banana, and Penicillins    Social History: The patient  reports that she quit smoking about 6 years ago. Her smoking use included cigarettes. She has a 7.50 pack-year smoking history. She has never used smokeless tobacco. She reports current alcohol use. She reports that she does not use drugs.   Family History: The patient's family history includes Arthritis in her father and mother; CAD in her father; COPD in her mother; Dementia in her father; Diabetes in her brother, father, and mother; Hypertension in her mother; Hypothyroidism in her sister.   ROS: No orthopnea or PND.  Physical Exam: VS:  BP 128/78 (BP Location: Left Arm)   Pulse 100   Ht 5' 8"  (1.727 m)   Wt 226 lb (102.5 kg)   SpO2 96% Comment: On 2 L [place] Oxygen  BMI 34.36 kg/m , BMI Body mass index is 34.36 kg/m.  Wt Readings from Last 3 Encounters:  06/25/20 226 lb (102.5 kg)  06/18/20 223 lb (101.2 kg)  03/18/20 222 lb 14.2 oz (101.1 kg)    General: Patient appears comfortable at rest. HEENT: Conjunctiva and lids normal, wearing a mask. Neck: Supple, no elevated JVP or carotid bruits, no thyromegaly. Lungs: Clear to auscultation, nonlabored breathing at rest. Cardiac: Regular rate and rhythm, no S3, 1/6 systolic murmur, no pericardial rub. Abdomen: Soft, nontender, bowel sounds present. Extremities: Mild ankle edema, distal pulses 2+. Skin: Warm and dry. Musculoskeletal: No kyphosis. Neuropsychiatric: Alert and oriented x3, affect grossly appropriate.  ECG:  An ECG dated 03/17/2020 was personally reviewed today and demonstrated:  Sinus rhythm with increased voltage.  Recent Labwork: 12/04/2019: TSH 2.715 03/15/2020: B Natriuretic Peptide 59.0 03/19/2020: Magnesium 2.1 04/30/2020: ALT 34; AST 31; BUN 17; Creatinine, Ser 1.01; Hemoglobin 11.2; Platelets 212; Potassium 3.9; Sodium 136     Component Value Date/Time   CHOL 308 (H) 12/04/2019 1116   TRIG 183 (H) 03/11/2020 1353   HDL 72 12/04/2019 1116   CHOLHDL 4.3 12/04/2019 1116   VLDL 31  12/04/2019 1116   LDLCALC 205 (H) 12/04/2019 1116    Other Studies Reviewed Today:  Echocardiogram 03/16/2020: 1. Left ventricular ejection fraction, by estimation, is 55 to 60%. The  left ventricle  has normal function. The left ventricle has no regional  wall motion abnormalities. Left ventricular diastolic parameters are  consistent with Grade I diastolic  dysfunction (impaired relaxation).  2. Right ventricular systolic function is normal. The right ventricular  size is normal.  3. The mitral valve is normal in structure. No evidence of mitral valve  regurgitation. No evidence of mitral stenosis.  4. The aortic valve is normal in structure. Aortic valve regurgitation is  not visualized. No aortic stenosis is present.  5. The inferior vena cava is normal in size with greater than 50%  respiratory variability, suggesting right atrial pressure of 3 mmHg.   Chest x-ray 04/30/2020: FINDINGS: Stable cardiomediastinal silhouette with normal heart size. No pneumothorax. No pleural effusion. Moderate patchy hazy and reticular opacities in both lungs with a peripheral basilar predominance, worsened at the lung bases in the interval. Partially visualized right shoulder arthroplasty.  IMPRESSION: Moderate patchy hazy and reticular opacities in both lungs with a peripheral basilar predominance, worsened at the lung bases in the interval. 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 Follow-up high-resolution chest CT study could be obtained in 3 months as clinically warranted.  Assessment and Plan:  1.  History of inappropriate sinus tachycardia per chart review.  She is tolerating recent switch from metoprolol to bisoprolol.  Continue with current dosing for now.  2.  History of chronic diastolic heart failure.  LVEF 55 to 60% in January of this year, grade 1 diastolic dysfunction and normal RV contraction as well.  No  follow-up assessment of LVEF since COVID-19 pneumonia.  We will obtain a follow-up echocardiogram.  Continue with present diuretic regimen.  3.  History of bilateral lower extremity DVT, she is on chronic anticoagulation with Eliquis.  Medication Adjustments/Labs and Tests Ordered: Current medicines are reviewed at length with the patient today.  Concerns regarding medicines are outlined above.   Tests Ordered: Orders Placed This Encounter  Procedures  . ECHOCARDIOGRAM COMPLETE    Medication Changes: No orders of the defined types were placed in this encounter.   Disposition:  Follow up 6 months.  Signed, Satira Sark, MD, [facility] 06/25/2020 1:56 PM    [facility] [facility] [address] [place],  [address] Phone: [phone]; Fax: [phone]

## 2020-06-25 ENCOUNTER — Other Ambulatory Visit: Payer: Self-pay

## 2020-06-25 ENCOUNTER — Encounter: Payer: Self-pay | Admitting: Cardiology

## 2020-06-25 ENCOUNTER — Ambulatory Visit (INDEPENDENT_AMBULATORY_CARE_PROVIDER_SITE_OTHER): Payer: Medicare Other | Admitting: Cardiology

## 2020-06-25 ENCOUNTER — Encounter (INDEPENDENT_AMBULATORY_CARE_PROVIDER_SITE_OTHER): Payer: Self-pay

## 2020-06-25 VITALS — BP 128/78 | HR 100 | Ht 68.0 in | Wt 226.0 lb

## 2020-06-25 DIAGNOSIS — R0602 Shortness of breath: Secondary | ICD-10-CM | POA: Diagnosis not present

## 2020-06-25 DIAGNOSIS — R000 Tachycardia, unspecified: Secondary | ICD-10-CM

## 2020-06-25 DIAGNOSIS — Z86718 Personal history of other venous thrombosis and embolism: Secondary | ICD-10-CM | POA: Diagnosis not present

## 2020-06-25 DIAGNOSIS — I5032 Chronic diastolic (congestive) heart failure: Secondary | ICD-10-CM | POA: Diagnosis not present

## 2020-06-25 NOTE — Patient Instructions (Signed)
Medication Instructions:  Your physician recommends that you continue on your current medications as directed. Please refer to the Current Medication list given to you today.  *If you need a refill on your cardiac medications before your next appointment, please call your pharmacy*   Lab Work: None today If you have labs (blood work) drawn today and your tests are completely normal, you will receive your results only by: Marland Kitchen MyChart Message (if you have MyChart) OR . A paper copy in the mail If you have any lab test that is abnormal or we need to change your treatment, we will call you to review the results.   Testing/Procedures: Your physician has requested that you have an echocardiogram. Echocardiography is a painless test that uses sound waves to create images of your heart. It provides your doctor with information about the size and shape of your heart and how well your heart's chambers and valves are working. This procedure takes approximately one hour. There are no restrictions for this procedure.    Follow-Up: At [facility], you and your health needs are our priority.  As part of our continuing mission to provide you with exceptional heart care, we have created designated Provider Care Teams.  These Care Teams include your primary Cardiologist (physician) and Advanced Practice Providers (APPs -  Physician Assistants and Nurse Practitioners) who all work together to provide you with the care you need, when you need it.  We recommend signing up for the patient portal called "MyChart".  Sign up information is provided on this After Visit Summary.  MyChart is used to connect with patients for Virtual Visits (Telemedicine).  Patients are able to view lab/test results, encounter notes, upcoming appointments, etc.  Non-urgent messages can be sent to your provider as well.   To learn more about what you can do with MyChart, go to [email or web address].    Your next appointment:   6  month(s)  The format for your next appointment:   In Person  Provider:   Rozann Lesches, MD   Other Instructions None

## 2020-06-28 ENCOUNTER — Ambulatory Visit (HOSPITAL_COMMUNITY)
Admission: RE | Admit: 2020-06-28 | Discharge: 2020-06-28 | Disposition: A | Discharge status: Home / Self Care | Payer: Medicare Other | Source: Ambulatory Visit | Attending: Cardiology | Admitting: Cardiology

## 2020-06-28 ENCOUNTER — Other Ambulatory Visit: Payer: Self-pay

## 2020-06-28 ENCOUNTER — Other Ambulatory Visit (HOSPITAL_COMMUNITY)
Admission: RE | Admit: 2020-06-28 | Discharge: 2020-06-28 | Disposition: A | Discharge status: Home / Self Care | Payer: Medicare Other | Source: Ambulatory Visit | Attending: Family Medicine | Admitting: Family Medicine

## 2020-06-28 DIAGNOSIS — E039 Hypothyroidism, unspecified: Secondary | ICD-10-CM | POA: Insufficient documentation

## 2020-06-28 DIAGNOSIS — E1165 Type 2 diabetes mellitus with hyperglycemia: Secondary | ICD-10-CM | POA: Insufficient documentation

## 2020-06-28 DIAGNOSIS — I509 Heart failure, unspecified: Secondary | ICD-10-CM | POA: Insufficient documentation

## 2020-06-28 DIAGNOSIS — I110 Hypertensive heart disease with heart failure: Secondary | ICD-10-CM | POA: Diagnosis not present

## 2020-06-28 DIAGNOSIS — E119 Type 2 diabetes mellitus without complications: Secondary | ICD-10-CM | POA: Diagnosis not present

## 2020-06-28 DIAGNOSIS — R0602 Shortness of breath: Secondary | ICD-10-CM | POA: Insufficient documentation

## 2020-06-28 DIAGNOSIS — E785 Hyperlipidemia, unspecified: Secondary | ICD-10-CM | POA: Insufficient documentation

## 2020-06-28 DIAGNOSIS — I10 Essential (primary) hypertension: Secondary | ICD-10-CM | POA: Insufficient documentation

## 2020-06-28 DIAGNOSIS — E669 Obesity, unspecified: Secondary | ICD-10-CM | POA: Insufficient documentation

## 2020-06-28 DIAGNOSIS — E559 Vitamin D deficiency, unspecified: Secondary | ICD-10-CM | POA: Insufficient documentation

## 2020-06-28 LAB — CBC WITH DIFFERENTIAL/PLATELET
Abs Immature Granulocytes: 0.08 10*3/uL — ABNORMAL HIGH (ref 0.00–0.07)
Basophils Absolute: 0.1 10*3/uL (ref 0.0–0.1)
Basophils Relative: 0 %
Eosinophils Absolute: 0.5 10*3/uL (ref 0.0–0.5)
Eosinophils Relative: 4 %
HCT: 41.6 % (ref 36.0–46.0)
Hemoglobin: 12.7 g/dL (ref 12.0–15.0)
Immature Granulocytes: 1 %
Lymphocytes Relative: 24 %
Lymphs Abs: 2.9 10*3/uL (ref 0.7–4.0)
MCH: 26.1 pg (ref 26.0–34.0)
MCHC: 30.5 g/dL (ref 30.0–36.0)
MCV: 85.6 fL (ref 80.0–100.0)
Monocytes Absolute: 0.7 10*3/uL (ref 0.1–1.0)
Monocytes Relative: 5 %
Neutro Abs: 8 10*3/uL — ABNORMAL HIGH (ref 1.7–7.7)
Neutrophils Relative %: 66 %
Platelets: 260 10*3/uL (ref 150–400)
RBC: 4.86 MIL/uL (ref 3.87–5.11)
RDW: 12.2 % (ref 11.5–15.5)
WBC: 12.3 10*3/uL — ABNORMAL HIGH (ref 4.0–10.5)
nRBC: 0 % (ref 0.0–0.2)

## 2020-06-28 LAB — LIPID PANEL
Cholesterol: 284 mg/dL — ABNORMAL HIGH (ref 0–200)
HDL: 74 mg/dL (ref >40)
LDL Cholesterol: 181 mg/dL — ABNORMAL HIGH (ref 0–99)
Total CHOL/HDL Ratio: 3.8 RATIO
Triglycerides: 147 mg/dL (ref <150)
VLDL: 29 mg/dL (ref 0–40)

## 2020-06-28 LAB — COMPREHENSIVE METABOLIC PANEL
ALT: 54 U/L — ABNORMAL HIGH (ref 0–44)
AST: 29 U/L (ref 15–41)
Albumin: 3.1 g/dL — ABNORMAL LOW (ref 3.5–5.0)
Alkaline Phosphatase: 358 U/L — ABNORMAL HIGH (ref 38–126)
Anion gap: 7 (ref 5–15)
BUN: 22 mg/dL (ref 8–23)
CO2: 30 mmol/L (ref 22–32)
Calcium: 9.2 mg/dL (ref 8.9–10.3)
Chloride: 98 mmol/L (ref 98–111)
Creatinine, Ser: 1.13 mg/dL — ABNORMAL HIGH (ref 0.44–1.00)
GFR, Estimated: 53 mL/min — ABNORMAL LOW (ref >60)
Glucose, Bld: 158 mg/dL — ABNORMAL HIGH (ref 70–99)
Potassium: 4 mmol/L (ref 3.5–5.1)
Sodium: 135 mmol/L (ref 135–145)
Total Bilirubin: 0.9 mg/dL (ref 0.3–1.2)
Total Protein: 7.8 g/dL (ref 6.5–8.1)

## 2020-06-28 LAB — ECHOCARDIOGRAM COMPLETE
Area-P 1/2: 9.73 cm2
S' Lateral: 3.2 cm

## 2020-06-28 LAB — HEMOGLOBIN A1C
Hgb A1c MFr Bld: 10.3 % — ABNORMAL HIGH (ref 4.8–5.6)
Mean Plasma Glucose: 248.91 mg/dL

## 2020-06-28 LAB — VITAMIN D 25 HYDROXY (VIT D DEFICIENCY, FRACTURES): Vit D, 25-Hydroxy: 36.09 ng/mL (ref 30–100)

## 2020-06-28 LAB — TSH: TSH: 0.559 u[IU]/mL (ref 0.350–4.500)

## 2020-06-28 NOTE — Progress Notes (Signed)
  Echocardiogram 2D Echocardiogram has been performed.  Stephanie [name] 06/28/2020, 11:11 AM

## 2020-07-02 ENCOUNTER — Encounter: Payer: Self-pay | Admitting: Internal Medicine

## 2020-07-02 ENCOUNTER — Ambulatory Visit (INDEPENDENT_AMBULATORY_CARE_PROVIDER_SITE_OTHER): Payer: Medicare Other | Admitting: Gastroenterology

## 2020-07-02 ENCOUNTER — Encounter: Payer: Self-pay | Admitting: Gastroenterology

## 2020-07-02 ENCOUNTER — Other Ambulatory Visit: Payer: Self-pay | Admitting: Internal Medicine

## 2020-07-02 ENCOUNTER — Other Ambulatory Visit: Payer: Self-pay

## 2020-07-02 ENCOUNTER — Ambulatory Visit (INDEPENDENT_AMBULATORY_CARE_PROVIDER_SITE_OTHER): Payer: Medicare Other | Admitting: Internal Medicine

## 2020-07-02 VITALS — BP 126/77 | HR 98 | Temp 97.5°F | Ht 68.0 in | Wt 228.0 lb

## 2020-07-02 DIAGNOSIS — J069 Acute upper respiratory infection, unspecified: Secondary | ICD-10-CM

## 2020-07-02 DIAGNOSIS — K5900 Constipation, unspecified: Secondary | ICD-10-CM | POA: Diagnosis not present

## 2020-07-02 DIAGNOSIS — R0600 Dyspnea, unspecified: Secondary | ICD-10-CM

## 2020-07-02 DIAGNOSIS — R945 Abnormal results of liver function studies: Secondary | ICD-10-CM

## 2020-07-02 DIAGNOSIS — U071 COVID-19: Secondary | ICD-10-CM

## 2020-07-02 DIAGNOSIS — R0609 Other forms of dyspnea: Secondary | ICD-10-CM

## 2020-07-02 DIAGNOSIS — R7989 Other specified abnormal findings of blood chemistry: Secondary | ICD-10-CM

## 2020-07-02 DIAGNOSIS — K219 Gastro-esophageal reflux disease without esophagitis: Secondary | ICD-10-CM | POA: Diagnosis not present

## 2020-07-02 MED ORDER — PANTOPRAZOLE SODIUM 40 MG PO TBEC: 40.0000 mg | DELAYED_RELEASE_TABLET | Freq: Two times a day (BID) | ORAL | 3 refills | Status: DC

## 2020-07-02 NOTE — Progress Notes (Signed)
Referring Provider: [name], [place] Primary Care Physician:  [name], FNP Primary GI: Dr. Gala Romney   Chief Complaint  Patient presents with  . fatty liver    HPI:   Stephanie [name] is a 70 y.o. female presenting today with a history of GERD, constipation, elevatedmixed patternLFTs with thorough evaluation ofin setting of fatty liver with AMA negative, ANApositive,ASMA weakly positive. Negative Hep B, C.Hemochromatosis negative. Immunoglobulins normal. Followed by oncology with history of nephrotic syndrome, IgG kappa monoclonal gammopathy, observed for now.Liver biopsy2017with mild chronic hepatitis, mild fatty liver, non significant fibrosis, non-specific findings and likely secondary to drug-induced etiology.   On 2 liters nasal cannula following bout with COVID. Sometimes will bump up to 3 liters. No abdominal pain. No N/V. No overt GI bleeding. Good appetite. Amitiza  8 mcg but taking 2 capsules at one time. Will sometimes skip days and titrate. Protonix BID for GERD currently.   Past Medical History:  Diagnosis Date  . Anemia   . Arthritis   . Asthma   . COPD (chronic obstructive pulmonary disease) ([place])   . COVID-19   . Deep vein thrombosis (DVT) of both lower extremities ([place]) 06/27/2015  . Fibromyalgia   . GERD (gastroesophageal reflux disease)   . H/O hiatal hernia   . Hypercholesteremia   . Hypertension   . Hyperthyroidism   . IBS (irritable bowel syndrome)   . Inappropriate sinus tachycardia   . Inner ear disease   . 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 Type 2 diabetes mellitus ([place])     Past Surgical History:  Procedure Laterality Date  . ABDOMINAL HYSTERECTOMY  partial  . CARPAL TUNNEL RELEASE Right 1991  . CATARACT EXTRACTION W/PHACO Right 05/08/2013   Procedure: CATARACT EXTRACTION PHACO AND INTRAOCULAR LENS PLACEMENT (IOC);  Surgeon: Tonny Branch, MD;  Location: [facility];  Service: Ophthalmology;  Laterality:  Right;  CDE 10.31  . CATARACT EXTRACTION W/PHACO Left 08/17/2013   Procedure: CATARACT EXTRACTION PHACO AND INTRAOCULAR LENS PLACEMENT (IOC);  Surgeon: Tonny Branch, MD;  Location: [facility];  Service: Ophthalmology;  Laterality: Left;  CDE:9.03  . CHOLECYSTECTOMY  1971  . COLONOSCOPY WITH PROPOFOL N/A 01/06/2016   Dr. Gala Romney: diverticulosis   . DENTAL SURGERY    . ESOPHAGEAL BRUSHING  08/29/2019   Procedure: ESOPHAGEAL BRUSHING;  Surgeon: Daneil Dolin, MD;  Location: [facility];  Service: Endoscopy;;  . ESOPHAGOGASTRODUODENOSCOPY (EGD) WITH PROPOFOL N/A 01/06/2016   Dr. Gala Romney: normal s/p empiric dilation   . ESOPHAGOGASTRODUODENOSCOPY (EGD) WITH PROPOFOL N/A 08/29/2019   esophageal plaques vs medication residue adherent to tubular esophagus s/p KOH brushing and dilation. Medium-sized hiatal hernia. + for candida. Diflucan.   Marland Kitchen EYE SURGERY    . MALONEY DILATION N/A 01/06/2016   Procedure: Venia Minks DILATION;  Surgeon: Daneil Dolin, MD;  Location: [facility];  Service: Endoscopy;  Laterality: N/A;  . Venia Minks DILATION N/A 08/29/2019   Procedure: Venia Minks DILATION;  Surgeon: Daneil Dolin, MD;  Location: [facility];  Service: Endoscopy;  Laterality: N/A;  . REVERSE SHOULDER ARTHROPLASTY Right 08/06/2017   Procedure: RIGHT REVERSE SHOULDER ARTHROPLASTY;  Surgeon: Netta Cedars, MD;  Location: [facility];  Service: Orthopedics;  Laterality: Right;  . WRIST GANGLION EXCISION Left     Current Outpatient Medications  Medication Sig Dispense Refill  . Accu-Chek FastClix Lancets MISC Apply topically.    Marland Kitchen acetaminophen (TYLENOL) 500 MG tablet Take 1,000 mg by mouth every 6 (six) hours as needed for  moderate pain or headache.     . albuterol (PROVENTIL HFA;VENTOLIN HFA) 108 (90 BASE) MCG/ACT inhaler Inhale 2 puffs into the lungs every 6 (six) hours as needed for wheezing or shortness of breath.     Marland Kitchen apixaban (ELIQUIS) 5 MG TABS tablet Take 10 mg by mouth BID through Friday 5/19 evening. Take 1m by  mouth BID starting 5/20 AM. (Patient taking differently: Take 5 mg by mouth 2 (two) times daily.) 60 tablet 3  . ARNUITY ELLIPTA 100 MCG/ACT AEPB Inhale 1 puff into the lungs at bedtime.   11  . Ascorbic Acid (VITAMIN C) 1000 MG tablet Take 1,000 mg by mouth daily.     . bisoprolol (ZEBETA) 5 MG tablet One twice daily 60 tablet 11  . cetirizine (ZYRTEC) 10 MG tablet Take 10 mg by mouth daily.    . Cholecalciferol (VITAMIN D) 2000 units tablet Take 4,000 Units by mouth daily.     . cimetidine (TAGAMET) 200 MG tablet Take 0.5 tablets (100 mg total) by mouth daily as needed.    .Marland KitchenCINNAMON PO Take 2-3 capsules by mouth 2 (two) times daily as needed (high blood sugar).     . clobetasol ointment (TEMOVATE) 04.58% Apply 1 application topically 3 (three) times daily as needed (imflammation).     . Doxepin HCl 5 % CREA Apply 2 g topically 3 (three) times daily as needed (itching).   5  . DULoxetine (CYMBALTA) 30 MG capsule Take 1 capsule (30 mg total) by mouth 2 (two) times daily. 30 capsule -0  . fluticasone (VERAMYST) 27.5 MCG/SPRAY nasal spray Place 2 sprays into the nose daily.     .Marland KitchenguaiFENesin (ROBITUSSIN) 100 MG/5ML SOLN Take 5 mLs (100 mg total) by mouth every 4 (four) hours as needed for cough or to loosen phlegm. 236 mL 0  . HUMALOG KWIKPEN 100 UNIT/ML KiwkPen Inject 30-47 Units into the skin See admin instructions. If blood sugar is  90-150= 45 units, 150-200= 46 units. 200-250= 47 units at breakfast and supper Lunchtime: If BS is  90-150=30 units,150- 200=31 units,200-250 units 32  5  . hydrOXYzine (ATARAX/VISTARIL) 25 MG tablet Take 25 mg by mouth every 8 (eight) hours as needed for itching.     . levothyroxine (SYNTHROID) 88 MCG tablet Take 88 mcg by mouth daily before breakfast.    . lubiprostone (AMITIZA) 8 MCG capsule Take 1 capsule (8 mcg total) by mouth 2 (two) times daily as needed for constipation. 60 capsule 5  . LYRICA 75 MG capsule Take 75 mg by mouth 2 (two) times daily.     .  methocarbamol (ROBAXIN) 500 MG tablet Take 1 tablet (500 mg total) by mouth 3 (three) times daily as needed. (Patient taking differently: Take 500 mg by mouth 3 (three) times daily as needed for muscle spasms.) 60 tablet 1  . pantoprazole (PROTONIX) 40 MG tablet Take 1 tablet (40 mg total) by mouth 2 (two) times daily before a meal. 180 tablet 3  . potassium chloride (KLOR-CON) 10 MEQ tablet Take 10 mEq by mouth daily.    . pravastatin (PRAVACHOL) 80 MG tablet Take 1 tablet (80 mg total) by mouth at bedtime. 30 tablet 0  . torsemide (DEMADEX) 20 MG tablet Take 2 tablets (40 mg total) by mouth daily. (Patient taking differently: Take 10-20 mg by mouth 2 (two) times daily as needed (swelling).) 60 tablet 0  . traMADol (ULTRAM) 50 MG tablet Take 50 mg by mouth 3 (three) times daily as  needed for moderate pain.   1  . traZODone (DESYREL) 50 MG tablet Take 50 mg by mouth at bedtime.    . vitamin B-12 (CYANOCOBALAMIN) 1000 MCG tablet Take 1,000 mcg by mouth daily.    . Vitamin D, Ergocalciferol, (DRISDOL) 1.25 MG (50000 UNIT) CAPS capsule Take 50,000 Units by mouth every 7 (seven) days.    . vitamin E 200 UNIT capsule Take 200 Units by mouth daily.     . XULTOPHY 100-3.6 UNIT-MG/ML SOPN Inject 85 Units as directed at bedtime.  0  . predniSONE (DELTASONE) 20 MG tablet Take 3 tablets by mouth daily x2 days; then 2 tablet by mouth daily x2 days; then 1 tablet by mouth daily x3 days; then half tablet by mouth daily x3 days and stop prednisone. (Patient not taking: Reported on 07/02/2020) 12 tablet 0   No current facility-administered medications for this visit.    Allergies as of 07/02/2020 - Review Complete 07/02/2020  Allergen Reaction Noted  . Tetracyclines & related Anaphylaxis 09/19/2010  . Banana Hives and Nausea And Vomiting 05/04/2013  . Penicillins Rash and Other (See Comments) 09/19/2010    Family History  Problem Relation Age of Onset  . Hypertension Mother   . Diabetes Mother   . COPD  Mother   . Arthritis Mother   . Diabetes Father   . Arthritis Father   . Dementia Father   . CAD Father   . Hypothyroidism Sister   . Diabetes Brother   . Colon cancer Neg Hx     Social History   Socioeconomic History  . Marital status: Married    Spouse name: Not on file  . Number of children: Not on file  . Years of education: Not on file  . Highest education level: Not on file  Occupational History  . Not on file  Tobacco Use  . Smoking status: Former Smoker    Packs/day: 0.25    Years: 30.00    Pack years: 7.50    Types: Cigarettes    Quit date: 02/17/2014    Years since quitting: 6.3  . Smokeless tobacco: Never Used  Vaping Use  . Vaping Use: Never used  Substance and Sexual Activity  . Alcohol use: Yes    Alcohol/week: 0.0 standard drinks    Comment: rare  . Drug use: No  . Sexual activity: Not Currently    Birth control/protection: Surgical  Other Topics Concern  . Not on file  Social History Narrative  . Not on file   Social Determinants of Health   Financial Resource Strain: Not on file  Food Insecurity: Not on file  Transportation Needs: Not on file  Physical Activity: Not on file  Stress: Not on file  Social Connections: Not on file    Review of Systems: Gen: Denies fever, chills, anorexia. Denies fatigue, weakness, weight loss.  CV: Denies chest pain, palpitations, syncope, peripheral edema, and claudication. Resp: see HPI GI: see HPI Derm: Denies rash, itching, dry skin Psych: Denies depression, anxiety, memory loss, confusion. No homicidal or suicidal ideation.  Heme: Denies bruising, bleeding, and enlarged lymph nodes.  Physical Exam: BP 126/77   Pulse 98   Temp (!) 97.5 F (36.4 C) (Temporal)   Ht 5' 8" (1.727 m)   Wt 228 lb (103.4 kg)   BMI 34.67 kg/m  General:   Alert and oriented. No distress noted. Pleasant and cooperative. 2 liters nasal cannula Head:  Normocephalic and atraumatic. Eyes:  Conjuctiva clear without scleral    icterus. Mouth:  Mask in place Abdomen:  +BS, soft, non-tender and non-distended. Sitting in chair with limited exam  Msk:  Symmetrical without gross deformities. Normal posture. Extremities:  Without edema. Neurologic:  Alert and  oriented x4 Psych:  Alert and cooperative. Normal mood and affect.  ASSESSMENT: Stephanie [name] is a 70 y.o. female presenting today with history of GERD, constipation, elevatedmixed patternLFTs with thorough evaluation ofin setting of fatty liver with AMA negative, ANApositive,ASMA weakly positive. Negative Hep B, C.Hemochromatosis negative. Immunoglobulins normal. Followed by oncology with history of nephrotic syndrome, IgG kappa monoclonal gammopathy, observed for now.Liver biopsy2017with mild chronic hepatitis, mild fatty liver, non significant fibrosis, non-specific findings and likely secondary to drug-induced etiology.   GERD: will decrease Protonix from BID to once daily if tolerated.   Constipation: continues with Amitiza 8 mcg prn, titrating on own. Continue with this for now.   Elevated LFTs: query due to fatty liver. May ultimately need repeat liver biopsy. Alk Phos chronically elevated with fluctuating transaminases. WIll update US abdomen now. Unable to exclude AMA negative PBC. I also noted she was ANA positive and weakly ASMA positive. May need to consider liver biopsy to assess for autoimmune.    PLAN:  Update US abdomen as it has been awhile Decrease Protonix to once daily Continue Amitiza prn Further recommendations to follow Follow-up in 6-8 months  Annitta Needs, PhD, ANP-BC [facility]

## 2020-07-02 NOTE — Assessment & Plan Note (Addendum)
Echo 7/92/17  GI  diastolic dysfunction only  -  06/18/2020   Walked    approx   100 ft  @ slow pace  stopped due to  Sob with sats 99% on 2lpm    -  Repeat ech 06/28/20  Low nl EF, diastolic dysfunction, mild RV dysfunction   Doing better on bisoprolol 5 mg bid with cards f/u planned   >> f/u here with pfts in 3 months

## 2020-07-02 NOTE — Progress Notes (Signed)
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 Repeat basic metabolic panel to evaluate lites and renal function 2. Reassess blood pressure and adjust antihypertensive treatment as needed 3. Continue to closely follow CBGs and adjust hypoglycemic regimen as required 4. Pleasereassess the need foroxygen supplementation. 5. RepeatCXRin 6 weeks to assure complete resolution of infiltrates.   Discharge Diagnoses: Principal Problem: Acute respiratory disease due to COVID-19 virus Active Problems: Hypertension COPD (chronic obstructive pulmonary disease) ([facility]) MGUS (monoclonal gammopathy of unknown significance) Pneumonia due to COVID-19 virus [id number] Chronic diastolic CHF (congestive heart failure) ([place])   Discharge Condition:Stable and improved. Discharged home with instruction to follow-up with PCP in 2 weeks.  CODE STATUS:Full code  Diet recommendation:Heart healthy modified to 100 diet.      Filed Weights   03/17/20 1902 03/18/20 0430  Weight: 103 kg 101.1 kg    History of present illness: Stephanie [name] a 70 y.o.femalewith medical history significant ofCovid pneumonia just discharged today to home from the hospital after completing remdesivir on steroids. Patient normally is on 2 L of oxygen and was discharged on 6 L with exertion but at rest was only requiring 4 L. She was transferred by ambulance home by the time she got home she was extremely short of breath and hypoxic. Patient was readmitted for acute hypoxemic respiratory failure in the setting of Covid pneumonia. Pulmonology  following.  Hospital Course: Acute hypoxemic respiratory failure secondary to COVID-19 pneumonia -Continue steroids and wean off O2 supplementation as tolerated.   -currently using 2L at rest and 3.5-4L on exertion -continue slow steroids tapering. -Procalcitonin low, not needing antibiotics currently -Continue breathing treatments as needed -Appreciate pulmonology consultationand rec's  Acute COPD exacerbation related to above -IV steroids and breathing treatments as noted above -no wheezing on exam.  Hypokalemia -Repleted -safe to resume diuretics and daily supplementation. -follow electrolytes trend  Type 2 diabetes with hyperglycemia-improved -Resume home adjusted hypoglycemic regimen -advise to follow low carb diet. -Recent A1c of 9.2% -expecting improvement in her CBG's while tapering off steroids.   CKD stage IIIa with AKI -Improved and back to baseline at discharge -Safe to resume the use of metolazone and Demadex -Continue to maintain adequate hydration -Repeat basic metabolic panelatfollow-up visit to assess electrolytes and renal function and stability.  Hypertension -stable -follow heart healthy diet. -Resume home antihypertensive agents.  Hypothyroidism -Continue Synthroid  Class 1 Obesity -low calorie diet, portion control and increase physical activity recommended.  -Body mass index is 33.89 kg/m.  Chronic diastolic heart failure -Stable and compensated -Continue to follow daily weights, low-sodium diet and resume home diuretic regimen.      History of Present Illness  06/18/2020  Pulmonary/ 1st office eval/ Stephanie [name] / [facility]  Chief Complaint  Patient presents with  . Pulmonary Consult    Referred by Dr Stephanie [name].  Pt had covid 06 Mar 2020- admitted to [facility] and sent home with supplemental o2. She gets winded with exertion such as cooking and cleaning her home. She uses her albuterol inhaler 2 x daily on average.   Dyspnea:  Room  to room at home / scooter for shopping  due to back > breathing on 2lpm  Cough: worse since covid / prednisone rx per PCP x 4 more days Sleep: flat bed with 3 pillows worse cough  SABA use: twice daily as above   rec Stop metaprolol  Bisoprolol 5 mg twice daily - call med to fine tune it.  Change pantoprazole 40 mg Take 30- 60 min before your first and last meals of the day  GERD Only use your albuterol as a rescue medication  Make sure you check your oxygen saturation  at your highest level of activity  to be sure it stays over 90% and adjust  02 flow upward to maintain this level if needed but remember to turn it back to previous settings when you stop (to conserve your supply).  Please schedule a follow up office visit in 2 weeks, call sooner if needed with all medications /inhalers/ solutions in hand so we can verify exactly what you are taking. This includes all medications from all doctors and over the [place] separate them into two bags:  the ones you take automatically, no matter what, vs the ones you take just when you feel you need them "BAG #2 is UP TO YOU"  - this will really help [place] help you take your medications more effectively.    07/02/2020  f/u ov/[place] office/Stephanie [name] re: sob > cough p covid 19  Chief Complaint  Patient presents with  . Pulmonary Consult    Breathing is unchanged. She has not had to use her albuterol inhaler or neb.   Dyspnea: housework [place] and back pain both limit her, when gets in rush sob  Cough: better p prednisone and has not recurred on aruity daily  Sleeping: on side/ 3 pillows under head bed is flat  SABA GNF:AOZH  02:  2lpm 24/7      No obvious day to day or daytime variability or assoc excess/ purulent sputum or mucus plugs or hemoptysis or cp or chest tightness, subjective wheeze or overt sinus or hb symptoms.   Sleeping as above without nocturnal  or early am exacerbation  of respiratory  c/o's or need for noct saba. Also denies  any obvious fluctuation of symptoms with weather or environmental changes or other aggravating or alleviating factors except as outlined above   No unusual exposure hx or h/o childhood pna/ asthma or knowledge of premature birth.  Current Allergies, Complete Past Medical History, Past Surgical History, Family History, and Social History were reviewed in [employer] record.  ROS  The following are not active complaints unless bolded Hoarseness, sore throat, dysphagia, dental problems, itching, sneezing,  nasal congestion or discharge of excess mucus or purulent secretions, ear ache,   fever, chills, sweats, unintended wt loss or wt gain, classically pleuritic or exertional cp,  orthopnea pnd or arm/hand swelling  or leg swelling, presyncope, palpitations, abdominal pain, anorexia, nausea, vomiting, diarrhea  or change in bowel habits or change in bladder habits, change in stools or change in urine, dysuria, hematuria,  rash, arthralgias, visual complaints, headache, numbness, weakness or ataxia or problems with walking or coordination,  change in mood or  memory.        Current Meds  Medication Sig  . Accu-Chek FastClix Lancets MISC Apply topically.  Marland Kitchen acetaminophen (TYLENOL) 500 MG tablet Take 1,000 mg by mouth every 6 (six) hours as needed for moderate pain or headache.   . albuterol (ACCUNEB) 1.25 MG/3ML nebulizer solution Take 1 ampule by nebulization every  6 (six) hours as needed for wheezing.  Marland Kitchen albuterol (PROVENTIL HFA;VENTOLIN HFA) 108 (90 BASE) MCG/ACT inhaler Inhale 2 puffs into the lungs every 6 (six) hours as needed for wheezing or shortness of breath.   Marland Kitchen apixaban (ELIQUIS) 5 MG TABS tablet Take 10 mg by mouth BID through Friday 5/19 evening. Take 63m by mouth BID starting 5/20 AM. (Patient taking differently: Take 5 mg by mouth 2 (two) times daily.)  . ARNUITY ELLIPTA 100 MCG/ACT AEPB Inhale 1 puff into the lungs at bedtime.   . Ascorbic Acid (VITAMIN C) 1000 MG  tablet Take 1,000 mg by mouth daily.   . bisoprolol (ZEBETA) 5 MG tablet One twice daily  . cetirizine (ZYRTEC) 10 MG tablet Take 10 mg by mouth daily.  . Cholecalciferol (VITAMIN D) 2000 units tablet Take 4,000 Units by mouth daily.   . cimetidine (TAGAMET) 200 MG tablet Take 0.5 tablets (100 mg total) by mouth daily as needed.  . clobetasol ointment (TEMOVATE) 02.24% Apply 1 application topically 3 (three) times daily as needed (imflammation).   . DULoxetine (CYMBALTA) 30 MG capsule Take 1 capsule (30 mg total) by mouth 2 (two) times daily.  . fluticasone (FLONASE) 50 MCG/ACT nasal spray Place 2 sprays into both nostrils daily.  .Marland KitchenguaiFENesin (ROBITUSSIN) 100 MG/5ML SOLN Take 5 mLs (100 mg total) by mouth every 4 (four) hours as needed for cough or to loosen phlegm.  .Marland KitchenHUMALOG KWIKPEN 100 UNIT/ML KiwkPen Inject 30-47 Units into the skin See admin instructions. If blood sugar is  90-150= 45 units, 150-200= 46 units. 200-250= 47 units at breakfast and supper Lunchtime: If BS is  90-150=30 units,150- 200=31 units,200-250 units 32  . hydrOXYzine (ATARAX/VISTARIL) 25 MG tablet Take 25 mg by mouth every 8 (eight) hours as needed for itching.   . levothyroxine (SYNTHROID) 88 MCG tablet Take 88 mcg by mouth daily before breakfast.  . lubiprostone (AMITIZA) 8 MCG capsule Take 1 capsule (8 mcg total) by mouth 2 (two) times daily as needed for constipation.  .Marland KitchenLYRICA 75 MG capsule Take 75 mg by mouth 2 (two) times daily.   . methocarbamol (ROBAXIN) 500 MG tablet Take 1 tablet (500 mg total) by mouth 3 (three) times daily as needed. (Patient taking differently: Take 500 mg by mouth 3 (three) times daily as needed for muscle spasms.)  . pantoprazole (PROTONIX) 40 MG tablet Take 40 mg by mouth daily.  . potassium chloride (KLOR-CON) 10 MEQ tablet Take 10 mEq by mouth daily.  . pravastatin (PRAVACHOL) 80 MG tablet Take 1 tablet (80 mg total) by mouth at bedtime.  . torsemide (DEMADEX) 20 MG tablet Take 2  tablets (40 mg total) by mouth daily. (Patient taking differently: Take 10-20 mg by mouth 2 (two) times daily as needed (swelling).)  . traMADol (ULTRAM) 50 MG tablet Take 50 mg by mouth 3 (three) times daily as needed for moderate pain.   . traZODone (DESYREL) 50 MG tablet Take 50 mg by mouth at bedtime.  . vitamin B-12 (CYANOCOBALAMIN) 1000 MCG tablet Take 1,000 mcg by mouth daily.  . Vitamin D, Ergocalciferol, (DRISDOL) 1.25 MG (50000 UNIT) CAPS capsule Take 50,000 Units by mouth every 7 (seven) days.  . vitamin E 200 UNIT capsule Take 200 Units by mouth daily.   . XULTOPHY 100-3.6 UNIT-MG/ML SOPN Inject 85 Units as directed at bedtime.              Past Medical History:  Diagnosis Date  . Anemia   .  Arthritis   . Asthma   . COPD (chronic obstructive pulmonary disease) ([place])   . Deep vein thrombosis (DVT) of both lower extremities ([place]) 06/27/2015  . Diabetes mellitus   . Fibromyalgia   . GERD (gastroesophageal reflux disease)   . H/O hiatal hernia   . Hypercholesteremia   . Hypertension   . Hyperthyroidism   . IBS (irritable bowel syndrome)   . Inner ear disease   . 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 PONV (postoperative nausea and vomiting)   . Tachycardia        Objective:     07/02/2020       228   06/25/20 226 lb (102.5 kg)  06/18/20 223 lb (101.2 kg)  03/18/20 222 lb 14.2 oz (101.1 kg)      Vital signs reviewed  07/02/2020  - Note at rest 02 sats  100% on 2lpm    General appearance:    Obese slow moving bf nad   HEENT : pt wearing mask not removed for exam due to covid -19 concerns.    NECK :  without JVD/Nodes/TM/ nl carotid upstrokes bilaterally   LUNGS: no acc muscle use,  Nl contour chest which is clear to A and P bilaterally without cough on insp or exp maneuvers   CV:  RRR  no s3 or murmur or increase in P2, and no edema   ABD: obesity  soft and nontender with nl inspiratory excursion in the supine position. No  bruits or organomegaly appreciated, bowel sounds nl  MS:  Very slow awkard slow gait/ ext warm without deformities, calf tenderness, cyanosis or clubbing No obvious joint restrictions   SKIN: warm and dry without lesions    NEURO:  alert, approp, nl sensorium with  no motor or cerebellar deficits apparent.        cxr ordered 07/02/2020         Assessment

## 2020-07-02 NOTE — Patient Instructions (Signed)
02 should be 2lpm at bedtime and during the day ok to leave off as long as saturations over 90%   GERD (REFLUX)  is an extremely common cause of respiratory symptoms just like yours , many times with no obvious heartburn at all.    It can be treated with medication, but also with lifestyle changes including elevation of the head of your bed (ideally with 6-8inch blocks under the headboard of your bed),  Smoking cessation, avoidance of late meals, excessive alcohol, and avoid fatty foods, chocolate, peppermint, colas, red wine, and acidic juices such as orange juice.  NO MINT OR MENTHOL PRODUCTS SO NO COUGH DROPS  USE SUGARLESS CANDY INSTEAD (Jolley ranchers or Stover's or Life Savers) or even ice chips will also do - the key is to swallow to prevent all throat clearing. NO OIL BASED VITAMINS - use powdered substitutes.  Avoid fish oil when coughing.  Arnuity is ok to use as long as cough not a problem - if coughing bothersome, stop arnuity  Only use your albuterol as a rescue medication to be used if you can't catch your breath by resting or doing a relaxed purse lip breathing pattern.  - The less you use it, the better it will work when you need it. - Ok to use up to 2 puffs  every 4 hours if you must but call for immediate appointment if use goes up over your usual need - Don't leave home without it !!  (think of it like the spare tire for your car)   Please remember to go to the  x-ray department  @  [facility] for your tests - we will call you with the results when they are available      Please schedule a follow up visit in 3 months with PFTs on return

## 2020-07-02 NOTE — Patient Instructions (Signed)
We are arranging an ultrasound in the near future.  Let's try decreasing Protonix to once daily. If this causes worsening of reflux, you can increase back to twice a day.  We will see you in 6-8 months!  I enjoyed seeing you again today! As you know, I value our relationship and want to provide genuine, compassionate, and quality care. I welcome your feedback. If you receive a survey regarding your visit,  I greatly appreciate you taking time to fill this out. See you next time!  Annitta Needs, PhD, ANP-BC [facility]  t

## 2020-07-03 ENCOUNTER — Encounter: Payer: Self-pay | Admitting: Internal Medicine

## 2020-07-03 NOTE — Assessment & Plan Note (Signed)
Onset 03/17/20 with covid 19 pneumonia -  No desats walking in office 06/18/2020 on 2lpm   -  07/02/2020   Walked RA  approx   200 ft  @ slow pace  stopped due to  Back pain /fatigue with sats still  97%    As of 07/02/2020 rec 2lpm hs and prn for sats < 90% but doubt she'll need it as so limited by chronic back pain.     Each maintenance medication was reviewed in detail including emphasizing most importantly the difference between maintenance and prns and under what circumstances the prns are to be triggered using an action plan format where appropriate.  Total time for H and P, chart review, counseling,  directly observing portions of ambulatory 02 saturation study/ and generating customized AVS unique to this office visit / same day charting  > 30 min

## 2020-07-12 ENCOUNTER — Ambulatory Visit (HOSPITAL_COMMUNITY): Payer: Medicare Other

## 2020-07-14 DIAGNOSIS — M797 Fibromyalgia: Secondary | ICD-10-CM | POA: Insufficient documentation

## 2020-08-10 ENCOUNTER — Other Ambulatory Visit: Payer: Self-pay | Admitting: Gastroenterology

## 2020-08-13 ENCOUNTER — Ambulatory Visit: Payer: Medicare Other | Admitting: Cardiology

## 2020-08-20 ENCOUNTER — Ambulatory Visit (HOSPITAL_COMMUNITY)
Admission: RE | Admit: 2020-08-20 | Discharge: 2020-08-20 | Disposition: A | Discharge status: Home / Self Care | Payer: Medicare Other | Source: Ambulatory Visit | Attending: Gastroenterology | Admitting: Gastroenterology

## 2020-08-20 DIAGNOSIS — R945 Abnormal results of liver function studies: Secondary | ICD-10-CM | POA: Insufficient documentation

## 2020-08-20 DIAGNOSIS — R7989 Other specified abnormal findings of blood chemistry: Secondary | ICD-10-CM

## 2020-08-26 ENCOUNTER — Inpatient Hospital Stay (HOSPITAL_COMMUNITY): Payer: Medicare Other | Attending: Hematology

## 2020-08-26 ENCOUNTER — Other Ambulatory Visit: Payer: Self-pay

## 2020-08-26 DIAGNOSIS — Z7901 Long term (current) use of anticoagulants: Secondary | ICD-10-CM | POA: Diagnosis not present

## 2020-08-26 DIAGNOSIS — Z79899 Other long term (current) drug therapy: Secondary | ICD-10-CM | POA: Insufficient documentation

## 2020-08-26 DIAGNOSIS — Z86718 Personal history of other venous thrombosis and embolism: Secondary | ICD-10-CM | POA: Insufficient documentation

## 2020-08-26 DIAGNOSIS — D72829 Elevated white blood cell count, unspecified: Secondary | ICD-10-CM | POA: Diagnosis not present

## 2020-08-26 DIAGNOSIS — D472 Monoclonal gammopathy: Secondary | ICD-10-CM | POA: Diagnosis present

## 2020-08-26 LAB — CBC WITH DIFFERENTIAL/PLATELET
Abs Immature Granulocytes: 0.06 10*3/uL (ref 0.00–0.07)
Basophils Absolute: 0.1 10*3/uL (ref 0.0–0.1)
Basophils Relative: 1 %
Eosinophils Absolute: 0.5 10*3/uL (ref 0.0–0.5)
Eosinophils Relative: 4 %
HCT: 40.6 % (ref 36.0–46.0)
Hemoglobin: 12.9 g/dL (ref 12.0–15.0)
Immature Granulocytes: 1 %
Lymphocytes Relative: 25 %
Lymphs Abs: 3.1 10*3/uL (ref 0.7–4.0)
MCH: 25.7 pg — ABNORMAL LOW (ref 26.0–34.0)
MCHC: 31.8 g/dL (ref 30.0–36.0)
MCV: 80.9 fL (ref 80.0–100.0)
Monocytes Absolute: 0.9 10*3/uL (ref 0.1–1.0)
Monocytes Relative: 7 %
Neutro Abs: 7.9 10*3/uL — ABNORMAL HIGH (ref 1.7–7.7)
Neutrophils Relative %: 62 %
Platelets: 210 10*3/uL (ref 150–400)
RBC: 5.02 MIL/uL (ref 3.87–5.11)
RDW: 12.5 % (ref 11.5–15.5)
WBC: 12.5 10*3/uL — ABNORMAL HIGH (ref 4.0–10.5)
nRBC: 0 % (ref 0.0–0.2)

## 2020-08-26 LAB — COMPREHENSIVE METABOLIC PANEL
ALT: 44 U/L (ref 0–44)
AST: 31 U/L (ref 15–41)
Albumin: 3 g/dL — ABNORMAL LOW (ref 3.5–5.0)
Alkaline Phosphatase: 322 U/L — ABNORMAL HIGH (ref 38–126)
Anion gap: 8 (ref 5–15)
BUN: 21 mg/dL (ref 8–23)
CO2: 25 mmol/L (ref 22–32)
Calcium: 9.2 mg/dL (ref 8.9–10.3)
Chloride: 101 mmol/L (ref 98–111)
Creatinine, Ser: 1.05 mg/dL — ABNORMAL HIGH (ref 0.44–1.00)
GFR, Estimated: 57 mL/min — ABNORMAL LOW (ref >60)
Glucose, Bld: 302 mg/dL — ABNORMAL HIGH (ref 70–99)
Potassium: 4.7 mmol/L (ref 3.5–5.1)
Sodium: 134 mmol/L — ABNORMAL LOW (ref 135–145)
Total Bilirubin: 0.3 mg/dL (ref 0.3–1.2)
Total Protein: 7.9 g/dL (ref 6.5–8.1)

## 2020-08-26 LAB — LACTATE DEHYDROGENASE: LDH: 152 U/L (ref 98–192)

## 2020-08-27 LAB — KAPPA/LAMBDA LIGHT CHAINS
Kappa free light chain: 407.9 mg/L — ABNORMAL HIGH (ref 3.3–19.4)
Kappa, lambda light chain ratio: 22.92 — ABNORMAL HIGH (ref 0.26–1.65)
Lambda free light chains: 17.8 mg/L (ref 5.7–26.3)

## 2020-08-28 LAB — PROTEIN ELECTROPHORESIS, SERUM
A/G Ratio: 0.7 (ref 0.7–1.7)
Albumin ELP: 3 g/dL (ref 2.9–4.4)
Alpha-1-Globulin: 0.3 g/dL (ref 0.0–0.4)
Alpha-2-Globulin: 1 g/dL (ref 0.4–1.0)
Beta Globulin: 1.2 g/dL (ref 0.7–1.3)
Gamma Globulin: 1.8 g/dL (ref 0.4–1.8)
Globulin, Total: 4.3 g/dL — ABNORMAL HIGH (ref 2.2–3.9)
M-Spike, %: 1.2 g/dL — ABNORMAL HIGH
Total Protein ELP: 7.3 g/dL (ref 6.0–8.5)

## 2020-09-02 ENCOUNTER — Other Ambulatory Visit: Payer: Self-pay

## 2020-09-02 ENCOUNTER — Other Ambulatory Visit: Payer: Self-pay | Admitting: *Deleted

## 2020-09-02 ENCOUNTER — Telehealth (HOSPITAL_COMMUNITY): Payer: Medicare Other | Admitting: Hematology

## 2020-09-02 ENCOUNTER — Inpatient Hospital Stay (HOSPITAL_BASED_OUTPATIENT_CLINIC_OR_DEPARTMENT_OTHER): Payer: Medicare Other | Admitting: Physician Assistant

## 2020-09-02 VITALS — BP 152/80 | HR 94 | Temp 96.8°F | Resp 20 | Wt 222.2 lb

## 2020-09-02 DIAGNOSIS — D472 Monoclonal gammopathy: Secondary | ICD-10-CM | POA: Diagnosis not present

## 2020-09-02 DIAGNOSIS — R748 Abnormal levels of other serum enzymes: Secondary | ICD-10-CM

## 2020-09-02 DIAGNOSIS — R7989 Other specified abnormal findings of blood chemistry: Secondary | ICD-10-CM

## 2020-09-02 DIAGNOSIS — R945 Abnormal results of liver function studies: Secondary | ICD-10-CM

## 2020-09-02 NOTE — Progress Notes (Signed)
[name], [name] [address]   CLINIC:  Medical Oncology/Hematology  PCP:  [name], [place] [address] [place] [address]  [phone]  REASON FOR VISIT:  Follow-up for MGUS  PRIOR THERAPY: N/A  CURRENT THERAPY: observation  INTERVAL HISTORY:  [name], a 70 y.o. female, returns for routine follow-up for her MGUS. Brihanna was last evaluated on 02/22/2020. Patient is unaccompanied for this visit. She reports that her energy levels and appetite are fair. She has fatigue but can complete her ADLs. She denies any significant weight loss. Patient denies any nausea or vomiting. She reports occasional episodes of RLQ abdominal pain that resolves after having a bowel movement. Her bowel movements vary from constipation to diarrhea due to IBS. She denies hematochezia or melena. Patient notices occasional episodes of gingival bleeding when she brushes her teeth. Patient has chronic shortness of breath with exertion. She is currently on 2 L of supplemental oxygen. Patient denies fevers, chills, night sweats, chest pain or cough. She has no other complaints.     REVIEW OF SYSTEMS:  Review of Systems  Constitutional:  Positive for appetite change (moderately decreased) and fatigue (severe).  HENT:  Negative.    Eyes: Negative.   Respiratory:  Positive for shortness of breath. Negative for cough.   Cardiovascular:  Positive for palpitations. Negative for chest pain.  Gastrointestinal:  Positive for abdominal pain (RLQ). Negative for nausea and vomiting.  Endocrine: Negative.   Genitourinary: Negative.    Musculoskeletal: Negative.   Skin: Negative.  Negative for itching and rash.  Neurological:  Positive for numbness (legs & hands). Negative for dizziness and headaches.  Hematological: Negative.   Psychiatric/Behavioral:  Positive for depression.   All other systems reviewed and are negative.  PAST MEDICAL/SURGICAL  HISTORY:  Past Medical History:  Diagnosis Date   Anemia    Arthritis    Asthma    COPD (chronic obstructive pulmonary disease) ([place])    COVID-19    Deep vein thrombosis (DVT) of both lower extremities ([place]) 06/27/2015   Fibromyalgia    GERD (gastroesophageal reflux disease)    H/O hiatal hernia    Hypercholesteremia    Hypertension    Hyperthyroidism    IBS (irritable bowel syndrome)    Inappropriate sinus tachycardia    Inner ear disease    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   Type 2 diabetes mellitus ([place])    Past Surgical History:  Procedure Laterality Date   ABDOMINAL HYSTERECTOMY  partial   CARPAL TUNNEL RELEASE Right 1991   CATARACT EXTRACTION W/PHACO Right 05/08/2013   Procedure: CATARACT EXTRACTION PHACO AND INTRAOCULAR LENS PLACEMENT ([place]);  Surgeon: Tonny Branch, MD;  Location: [facility];  Service: Ophthalmology;  Laterality: Right;  CDE 10.31   CATARACT EXTRACTION W/PHACO Left 08/17/2013   Procedure: CATARACT EXTRACTION PHACO AND INTRAOCULAR LENS PLACEMENT ([facility]);  Surgeon: Tonny Branch, MD;  Location: [facility];  Service: Ophthalmology;  Laterality: Left;  CDE:9.03   CHOLECYSTECTOMY  1971   COLONOSCOPY WITH PROPOFOL N/A 01/06/2016   Dr. Gala Romney: diverticulosis    DENTAL SURGERY     ESOPHAGEAL BRUSHING  08/29/2019   Procedure: ESOPHAGEAL BRUSHING;  Surgeon: Daneil Dolin, MD;  Location: [facility];  Service: Endoscopy;;   ESOPHAGOGASTRODUODENOSCOPY (EGD) WITH PROPOFOL N/A 01/06/2016   Dr. Gala Romney: normal s/p empiric dilation    ESOPHAGOGASTRODUODENOSCOPY (EGD) WITH PROPOFOL N/A 08/29/2019   esophageal plaques vs medication residue adherent  to tubular esophagus s/p KOH brushing and dilation. Medium-sized hiatal hernia. + for candida. Diflucan.    EYE SURGERY     MALONEY DILATION N/A 01/06/2016   Procedure: Venia Minks DILATION;  Surgeon: Daneil Dolin, MD;  Location: [facility];  Service: Endoscopy;  Laterality: N/A;   MALONEY DILATION N/A 08/29/2019    Procedure: Venia Minks DILATION;  Surgeon: Daneil Dolin, MD;  Location: [facility];  Service: Endoscopy;  Laterality: N/A;   REVERSE SHOULDER ARTHROPLASTY Right 08/06/2017   Procedure: RIGHT REVERSE SHOULDER ARTHROPLASTY;  Surgeon: Netta Cedars, MD;  Location: [place];  Service: Orthopedics;  Laterality: Right;   WRIST GANGLION EXCISION Left     SOCIAL HISTORY:  Social History   Socioeconomic History   Marital status: Married    Spouse name: Not on file   Number of children: Not on file   Years of education: Not on file   Highest education level: Not on file  Occupational History   Not on file  Tobacco Use   Smoking status: Former    Packs/day: 0.25    Years: 30.00    Pack years: 7.50    Types: Cigarettes    Quit date: 02/17/2011    Years since quitting: 9.5   Smokeless tobacco: Never  Vaping Use   Vaping Use: Never used  Substance and Sexual Activity   Alcohol use: Yes    Alcohol/week: 0.0 standard drinks    Comment: rare   Drug use: No   Sexual activity: Not Currently    Birth control/protection: Surgical  Other Topics Concern   Not on file  Social History Narrative   Not on file   Social Determinants of Health   Financial Resource Strain: Not on file  Food Insecurity: Not on file  Transportation Needs: Not on file  Physical Activity: Not on file  Stress: Not on file  Social Connections: Not on file  Intimate Partner Violence: Not on file    FAMILY HISTORY:  Family History  Problem Relation Age of Onset   Hypertension Mother    Diabetes Mother    COPD Mother    Arthritis Mother    Diabetes Father    Arthritis Father    Dementia Father    CAD Father    Hypothyroidism Sister    Diabetes Brother    Colon cancer Niece    Colon polyps Neg Hx     CURRENT MEDICATIONS:  Current Outpatient Medications  Medication Sig Dispense Refill   Accu-Chek FastClix Lancets MISC Apply topically.     ACCU-CHEK GUIDE test strip 4 (four) times daily.     Alcohol  Swabs (ALCOHOL PADS) 70 % PADS SMARTSIG:Pledget(s) Topical 4 Times Daily     apixaban (ELIQUIS) 5 MG TABS tablet Take 10 mg by mouth BID through Friday 5/19 evening. Take 82m by mouth BID starting 5/20 AM. (Patient taking differently: Take 5 mg by mouth 2 (two) times daily.) 60 tablet 3   ARNUITY ELLIPTA 100 MCG/ACT AEPB Inhale 1 puff into the lungs at bedtime.   11   Ascorbic Acid (VITAMIN C) 1000 MG tablet Take 1,000 mg by mouth daily.      bisoprolol (ZEBETA) 5 MG tablet One twice daily 60 tablet 11   Blood Glucose Calibration (ACCU-CHEK GUIDE CONTROL) LIQD See admin instructions.     Blood Glucose Monitoring Suppl (ACCU-CHEK GUIDE ME) w/Device KIT 4 (four) times daily.     cetirizine (ZYRTEC) 10 MG tablet Take 10 mg by mouth daily.  Cholecalciferol (VITAMIN D) 2000 units tablet Take 4,000 Units by mouth daily.      DULoxetine (CYMBALTA) 30 MG capsule Take 1 capsule (30 mg total) by mouth 2 (two) times daily. 30 capsule -0   fluticasone (FLONASE) 50 MCG/ACT nasal spray Place 2 sprays into both nostrils daily.     HUMALOG KWIKPEN 100 UNIT/ML KiwkPen Inject 30-47 Units into the skin See admin instructions. If blood sugar is  90-150= 45 units, 150-200= 46 units. 200-250= 47 units at breakfast and supper Lunchtime: If BS is  90-150=30 units,150- 200=31 units,200-250 units 32  5   hydrOXYzine (ATARAX/VISTARIL) 25 MG tablet Take 25 mg by mouth every 8 (eight) hours as needed for itching.      Lancet Devices (EASY MINI EJECT LANCING DEVICE) MISC 4 (four) times daily.     levofloxacin (LEVAQUIN) 500 MG tablet Take 500 mg by mouth daily.     levothyroxine (SYNTHROID) 88 MCG tablet Take 88 mcg by mouth daily before breakfast.     lubiprostone (AMITIZA) 8 MCG capsule TAKE 1 CAPSULE(8 MCG) BY MOUTH TWICE DAILY AS NEEDED FOR CONSTIPATION 60 capsule 5   LYRICA 75 MG capsule Take 75 mg by mouth 2 (two) times daily.      pantoprazole (PROTONIX) 40 MG tablet Take 40 mg by mouth daily.     potassium chloride  (KLOR-CON) 10 MEQ tablet Take 10 mEq by mouth daily.     pravastatin (PRAVACHOL) 80 MG tablet Take 1 tablet (80 mg total) by mouth at bedtime. 30 tablet 0   RESTASIS 0.05 % ophthalmic emulsion      torsemide (DEMADEX) 20 MG tablet Take 2 tablets (40 mg total) by mouth daily. (Patient taking differently: Take 10-20 mg by mouth 2 (two) times daily as needed (swelling).) 60 tablet 0   traZODone (DESYREL) 50 MG tablet Take 50 mg by mouth at bedtime.     vitamin B-12 (CYANOCOBALAMIN) 1000 MCG tablet Take 1,000 mcg by mouth daily.     Vitamin D, Ergocalciferol, (DRISDOL) 1.25 MG (50000 UNIT) CAPS capsule Take 50,000 Units by mouth every 7 (seven) days.     vitamin E 200 UNIT capsule Take 200 Units by mouth daily.      XULTOPHY 100-3.6 UNIT-MG/ML SOPN Inject 85 Units as directed at bedtime.  0   acetaminophen (TYLENOL) 500 MG tablet Take 1,000 mg by mouth every 6 (six) hours as needed for moderate pain or headache.  (Patient not taking: Reported on 09/02/2020)     albuterol (ACCUNEB) 1.25 MG/3ML nebulizer solution Take 1 ampule by nebulization every 6 (six) hours as needed for wheezing. (Patient not taking: Reported on 09/02/2020)     cimetidine (TAGAMET) 200 MG tablet Take 0.5 tablets (100 mg total) by mouth daily as needed. (Patient not taking: Reported on 09/02/2020)     CINNAMON PO Take 2-3 capsules by mouth 2 (two) times daily as needed (high blood sugar).  (Patient not taking: Reported on 09/02/2020)     clobetasol ointment (TEMOVATE) 5.09 % Apply 1 application topically 3 (three) times daily as needed (imflammation).  (Patient not taking: Reported on 09/02/2020)     guaiFENesin (ROBITUSSIN) 100 MG/5ML SOLN Take 5 mLs (100 mg total) by mouth every 4 (four) hours as needed for cough or to loosen phlegm. (Patient not taking: Reported on 09/02/2020) 236 mL 0   methocarbamol (ROBAXIN) 500 MG tablet Take 1 tablet (500 mg total) by mouth 3 (three) times daily as needed. (Patient not taking: Reported on 09/02/2020)  60  tablet 1   traMADol (ULTRAM) 50 MG tablet Take 50 mg by mouth 3 (three) times daily as needed for moderate pain.  (Patient not taking: Reported on 09/02/2020)  1   No current facility-administered medications for this visit.    ALLERGIES:  Allergies  Allergen Reactions   Tetracyclines & Related Anaphylaxis   Banana Hives and Nausea And Vomiting   Penicillins Rash and Other (See Comments)    Has patient had a PCN reaction causing immediate rash, facial/tongue/throat swelling, SOB or lightheadedness with hypotension: No Has patient had a PCN reaction causing severe rash involving mucus membranes or skin necrosis: No Has patient had a PCN reaction that required hospitalization No Has patient had a PCN reaction occurring within the last 10 years: No If all of the above answers are "NO", then may proceed with Cephalosporin use.     PHYSICAL EXAM:  Performance status (ECOG): 1 - Symptomatic but completely ambulatory  Vitals:   09/02/20 1341  BP: (!) 152/80  Pulse: 94  Resp: 20  Temp: (!) 96.8 F (36 C)  SpO2: 100%   Wt Readings from Last 3 Encounters:  09/02/20 222 lb 3.2 oz (100.8 kg)  07/02/20 228 lb (103.4 kg)  07/02/20 228 lb (103.4 kg)   Physical Exam Vitals and nursing note reviewed.  Constitutional:      Appearance: Normal appearance.  HENT:     Mouth/Throat:     Mouth: Mucous membranes are moist.  Eyes:     Pupils: Pupils are equal, round, and reactive to light.  Cardiovascular:     Rate and Rhythm: Normal rate and regular rhythm.     Pulses: Normal pulses.     Heart sounds: Normal heart sounds.  Pulmonary:     Effort: Pulmonary effort is normal.     Breath sounds: Normal breath sounds.  Abdominal:     Palpations: Abdomen is soft. There is no mass.     Tenderness: There is no abdominal tenderness.  Musculoskeletal:     Cervical back: Neck supple.     Right lower leg: No edema.     Left lower leg: No edema.  Neurological:     Mental Status: She is alert  and oriented to person, place, and time.  Psychiatric:        Mood and Affect: Mood normal.        Behavior: Behavior normal.    LABORATORY DATA:  I have reviewed the labs as listed.  CBC Latest Ref Rng & Units 08/26/2020 06/28/2020 04/30/2020  WBC 4.0 - 10.5 K/uL 12.5(H) 12.3(H) 20.1(H)  Hemoglobin 12.0 - 15.0 g/dL 12.9 12.7 11.2(L)  Hematocrit 36.0 - 46.0 % 40.6 41.6 35.9(L)  Platelets 150 - 400 K/uL 210 260 212   CMP Latest Ref Rng & Units 08/26/2020 06/28/2020 04/30/2020  Glucose 70 - 99 mg/dL 302(H) 158(H) 160(H)  BUN 8 - 23 mg/dL 21 22 17   Creatinine 0.44 - 1.00 mg/dL 1.05(H) 1.13(H) 1.01(H)  Sodium 135 - 145 mmol/L 134(L) 135 136  Potassium 3.5 - 5.1 mmol/L 4.7 4.0 3.9  Chloride 98 - 111 mmol/L 101 98 104  CO2 22 - 32 mmol/L 25 30 23   Calcium 8.9 - 10.3 mg/dL 9.2 9.2 9.1  Total Protein 6.5 - 8.1 g/dL 7.9 7.8 7.7  Total Bilirubin 0.3 - 1.2 mg/dL 0.3 0.9 0.6  Alkaline Phos 38 - 126 U/L 322(H) 358(H) 257(H)  AST 15 - 41 U/L 31 29 31   ALT 0 - 44 U/L 44 54(H) 34  Component Value Date/Time   RBC 5.02 08/26/2020 1123   MCV 80.9 08/26/2020 1123   MCH 25.7 (L) 08/26/2020 1123   MCHC 31.8 08/26/2020 1123   RDW 12.5 08/26/2020 1123   LYMPHSABS 3.1 08/26/2020 1123   MONOABS 0.9 08/26/2020 1123   EOSABS 0.5 08/26/2020 1123   BASOSABS 0.1 08/26/2020 1123    DIAGNOSTIC IMAGING:  I have independently reviewed the scans and discussed with the patient. US Abdomen Limited RUQ (LIVER/GB)  Result Date: 08/20/2020 CLINICAL DATA:  Fatty liver Elevated liver function tests EXAM: ULTRASOUND ABDOMEN LIMITED RIGHT UPPER QUADRANT COMPARISON:  04/25/2018 FINDINGS: Gallbladder: Prior cholecystectomy Common bile duct: Diameter: 4 mm Liver: Subtle nodularity of hepatic contours with mildly coarsened parenchymal echogenicity is consistent with cirrhosis. No focal hepatic lesion. Limited visualization of the portal vein. Other: None. IMPRESSION: Subtle nodularity of the hepatic contours with  coarsened parenchymal echogenicity suspicious for cirrhosis. Electronically Signed   By: Miachel Roux M.D.   On: 08/20/2020 11:38     ASSESSMENT:  1.  IgG kappa MGUS: -Bone marrow biopsy on 09/03/2015 showing 9% plasma cells and negative skeletal survey. -History of nephrotic range proteinuria, resolved now. -Skeletal survey on 02/13/20 did not show any lytic lesions.   2.  Elevated liver enzymes: -Ultrasound abdomen on 04/25/2018 showed fatty liver.   3.  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 -She is on Eliquis indefinitely.    PLAN:  1.  IgG kappa MGUS: -We reviewed labs from 08/26/2020.  Creatinine has improved to 1.05 and calcium within normal limits.  -SPEP showed M spike improved to 1.2.  Hemoglobin was 12.9, normal.  Free light chain ratio improved to 22.92  Kappa light chains are 407.9 -Her MGUS is stable at this time.  We plan to see her back in December 2022 with repeat labs along with UPEP and bone met survey.   2.  Bilateral DVT: -Continue Eliquis.  No bleeding issues noted.  3. Leukocytosis: --WBC was 12.5 on 08/26/2020 --No evidence of active infection as patient is afebrile. --Could be secondary to prior steroid taper she was on.  --Monitor for now.   Orders placed this encounter:  Orders Placed This Encounter  Procedures   DG Bone Survey Met   CBC with Differential   Comprehensive metabolic panel   Multiple Myeloma Panel (SPEP&IFE w/QIG)   Kappa/lambda light chains   Beta 2 microglobuline, serum   24 Hr Urn UIFE/Light Chains/TP QN   Patient expressed understanding of the plan provided.   I have spent a total of 25 minutes minutes of face-to-face and non-face-to-face time, preparing to see the patient, obtaining and/or reviewing separately obtained history, performing a medically appropriate examination, counseling and educating the patient, ordering tests/, documenting clinical information in the electronic health  record, and care coordination.   Lincoln Brigham PA-C Hematology and [place]

## 2020-09-03 ENCOUNTER — Telehealth: Payer: Self-pay | Admitting: Gastroenterology

## 2020-09-03 NOTE — Telephone Encounter (Signed)
Corrected information in form.

## 2020-09-03 NOTE — Telephone Encounter (Signed)
Received the below staff message from Lennox Solders requesting ok to hold Eliquis x 48 hours prior to liver biopsy ordered by Roseanne Kaufman, NP.  [facility] Nurse, please reach out to patient's cardiologist to get approval to hold Eliquis x 48 hours.  FYI to Roseanne Kaufman.  ____________________________________________________ From: Lenore Cordia Sent: 09/03/2020  11:44 AM EDT To: Mahala Menghini, PA-C, Annitta Needs, NP, * Subject: Blood Thinners (Biopsy)                        Hello  At the request of Roseanne Kaufman, NP, Pt. Stephanie [name] has been scheduled for a liver biopsy On august 9th,  she is on the blood thinner Eliquis She will need to hold her eliquis two days prior to her biopsy Permission is needed.  FYI: patient chose to have it scheduled in august.   [place]

## 2020-09-03 NOTE — Telephone Encounter (Signed)
Please contact requesting office and have them send formal preoperative cardiac request form.  We need details surrounding surgery including surgeon, anesthesia, etc.  Thank you,  Jossie Ng. Danija Gosa NP-C    09/03/2020, 3:24 PM [facility] [place] [address] Office [phone] Fax [phone]

## 2020-09-03 NOTE — Telephone Encounter (Addendum)
Attention: Preop   We would like obtain cardiac clearance for this patient please.  Procedure: Liver biopsy  Date: 09/24/2020  Medication to hold: Eliquis x 2 days prior to biopsy  Surgeon: Interventional Radiologist  Phone: [phone]  Fax:  [phone]  Type of Anesthesia: Possible sedation given IV at discretion of radiologist.

## 2020-09-04 NOTE — Telephone Encounter (Signed)
Patient with diagnosis of Hx of bilateral lower extremity DVT on Eliquis for anticoagulation.    Procedure: Liver biopsy Date of procedure: 09/24/20   CrCl 62 mL/min using adjusted body weight Platelet count 210K  Patient with history of TIA, HTN, CHF, and DM.  Unclear who is prescribing Eliquis or if she is taking it.    Patient can hold Eliquis for 2 days before procedure, but would verify with patient she is still taking medication and who is prescribing it.

## 2020-09-05 ENCOUNTER — Encounter (HOSPITAL_COMMUNITY): Payer: Self-pay | Admitting: Hematology

## 2020-09-05 NOTE — Telephone Encounter (Signed)
   Name: Stephanie [name]  DOB: [birth year]  MRN: [id number]  Primary Cardiologist: Rozann Lesches, MD  Chart reviewed as part of pre-operative protocol coverage. Because of [name]'s most recent echo with EF 50-55%, LV global hypokinesis, and reduced RV function, she will require a follow-up visit as recommended by her primary cardiologist on his result note and in order to better assess preoperative cardiovascular risk.  Pre-op covering staff: - Please schedule appointment and call patient to inform them. She already has a visit, which will need to be sooner in order to keep her current procedure date.  --Please add "pre-op clearance" to the appointment notes so provider is aware. - Please contact requesting surgeon's office via preferred method (i.e, phone, fax) to inform them of need for appointment prior to surgery.  Pharmacy recommendations as below. Will remove from preop pool.  Arvil Chaco, PA-C  09/05/2020, 12:44 PM

## 2020-09-09 NOTE — Telephone Encounter (Signed)
Pt has appt with Dr. Domenic Polite 09/30/20. Will forward notes to MD for upcoming appt. Will send FYI to surgeon's office pt has appt 09/30/20.

## 2020-09-10 NOTE — Telephone Encounter (Signed)
[facility] clinical pool: please postpone liver biopsy till after cardiac appt. Thanks!

## 2020-09-10 NOTE — Telephone Encounter (Signed)
I have sent a message to [facility] at central scheduling making aware.   Called pt and she states central scheduling called her today and moved her biopsy appt to 10/02/20. She wants to keep it as scheduled. FYI to [employer]

## 2020-09-13 ENCOUNTER — Other Ambulatory Visit (HOSPITAL_COMMUNITY)
Admission: RE | Admit: 2020-09-13 | Discharge: 2020-09-13 | Disposition: A | Discharge status: Home / Self Care | Payer: Medicare Other | Source: Ambulatory Visit | Attending: Internal Medicine | Admitting: Internal Medicine

## 2020-09-13 ENCOUNTER — Other Ambulatory Visit: Payer: Self-pay

## 2020-09-13 DIAGNOSIS — Z20822 Contact with and (suspected) exposure to covid-19: Secondary | ICD-10-CM | POA: Insufficient documentation

## 2020-09-13 DIAGNOSIS — Z01812 Encounter for preprocedural laboratory examination: Secondary | ICD-10-CM | POA: Diagnosis present

## 2020-09-13 LAB — SARS CORONAVIRUS 2 (TAT 6-24 HRS): SARS Coronavirus 2: NEGATIVE

## 2020-09-17 ENCOUNTER — Encounter: Payer: Self-pay | Admitting: *Deleted

## 2020-09-17 ENCOUNTER — Other Ambulatory Visit: Payer: Self-pay

## 2020-09-17 ENCOUNTER — Ambulatory Visit (HOSPITAL_COMMUNITY)
Admission: RE | Admit: 2020-09-17 | Discharge: 2020-09-17 | Disposition: A | Discharge status: Home / Self Care | Payer: Medicare Other | Source: Ambulatory Visit | Attending: Internal Medicine | Admitting: Internal Medicine

## 2020-09-17 DIAGNOSIS — J069 Acute upper respiratory infection, unspecified: Secondary | ICD-10-CM | POA: Insufficient documentation

## 2020-09-17 DIAGNOSIS — R0609 Other forms of dyspnea: Secondary | ICD-10-CM

## 2020-09-17 DIAGNOSIS — R0600 Dyspnea, unspecified: Secondary | ICD-10-CM | POA: Diagnosis present

## 2020-09-17 DIAGNOSIS — U071 COVID-19: Secondary | ICD-10-CM | POA: Diagnosis present

## 2020-09-17 LAB — PULMONARY FUNCTION TEST
DL/VA % pred: 79 %
DL/VA: 3.2 ml/min/mmHg/L
DLCO cor % pred: 53 %
DLCO cor: 11.82 ml/min/mmHg
DLCO unc % pred: 52 %
DLCO unc: 11.63 ml/min/mmHg
FEF 25-75 Post: 3.18 L/sec
FEF 25-75 Pre: 3.08 L/sec
FEF2575-%Change-Post: 3 %
FEF2575-%Pred-Post: 160 %
FEF2575-%Pred-Pre: 154 %
FEV1-%Change-Post: -1 %
FEV1-%Pred-Post: 100 %
FEV1-%Pred-Pre: 102 %
FEV1-Post: 2.22 L
FEV1-Pre: 2.25 L
FEV1FVC-%Change-Post: 3 %
FEV1FVC-%Pred-Pre: 109 %
FEV6-%Change-Post: -4 %
FEV6-%Pred-Post: 92 %
FEV6-%Pred-Pre: 96 %
FEV6-Post: 2.53 L
FEV6-Pre: 2.65 L
FEV6FVC-%Pred-Post: 103 %
FEV6FVC-%Pred-Pre: 103 %
FVC-%Change-Post: -4 %
FVC-%Pred-Post: 89 %
FVC-%Pred-Pre: 93 %
FVC-Post: 2.53 L
FVC-Pre: 2.65 L
Post FEV1/FVC ratio: 88 %
Post FEV6/FVC ratio: 100 %
Pre FEV1/FVC ratio: 85 %
Pre FEV6/FVC Ratio: 100 %
RV % pred: 0 %
RV: 0.01 L
TLC % pred: 44 %
TLC: 2.52 L

## 2020-09-17 MED ORDER — ALBUTEROL SULFATE (2.5 MG/3ML) 0.083% IN NEBU
2.5000 mg | INHALATION_SOLUTION | Freq: Once | RESPIRATORY_TRACT | Status: AC
  Administered 2020-09-17: 2.5 mg via RESPIRATORY_TRACT

## 2020-09-23 ENCOUNTER — Encounter: Payer: Self-pay | Admitting: *Deleted

## 2020-09-24 ENCOUNTER — Ambulatory Visit (HOSPITAL_COMMUNITY): Payer: Medicare Other

## 2020-09-29 NOTE — Progress Notes (Signed)
Cardiology Office Note  Date: 09/30/2020   ID: ELLORY KHURANA, DOB [birth year], MRN [id number]  PCP:  [name], FNP  Cardiologist:  Rozann Lesches, MD Electrophysiologist:  None   Chief Complaint  Patient presents with   Preprocedure cardiac evaluation    History of Present Illness: Stephanie [name] is a 70 y.o. female last seen in May.  She is referred back to the office for pre-procedure cardiac assessment. She is being considered for a liver biopsy under conscious sedation by interventional radiology.  Procedure recommended by gastroenterology for follow-up on abnormal LFTs and history of fatty liver disease.  She states that her weight has been reasonably well controlled on current diuretic regimen, actually down 6 pounds compared to May.  Still has elevations in heart rate particular with activity and a history of inappropriate sinus tachycardia.  She had not yet taken her Zebeta this morning.  Echocardiogram in May revealed LVEF 50 to 55% with mild LVH, mildly reduced RV contraction, and trivial mitral regurgitation.  She continues to follow with Dr. Melvyn Novas, had recent reassuring PFTs.  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   Type 2 diabetes mellitus ([place])     Past Surgical History:  Procedure Laterality Date   ABDOMINAL HYSTERECTOMY  partial   CARPAL TUNNEL RELEASE Right 1991   CATARACT EXTRACTION W/PHACO Right 05/08/2013   Procedure: CATARACT EXTRACTION PHACO AND INTRAOCULAR LENS PLACEMENT ([place]);  Surgeon:  Tonny Branch, MD;  Location: [facility];  Service: Ophthalmology;  Laterality: Right;  CDE 10.31   CATARACT EXTRACTION W/PHACO Left 08/17/2013   Procedure: CATARACT EXTRACTION PHACO AND INTRAOCULAR LENS PLACEMENT ([facility]);  Surgeon: Tonny Branch, MD;  Location: [facility];  Service: Ophthalmology;  Laterality: Left;  CDE:9.03   CHOLECYSTECTOMY  1971   COLONOSCOPY WITH PROPOFOL N/A 01/06/2016   Dr. Gala Romney: diverticulosis    DENTAL SURGERY     ESOPHAGEAL BRUSHING  08/29/2019   Procedure: ESOPHAGEAL BRUSHING;  Surgeon: Daneil Dolin, MD;  Location: [facility];  Service: Endoscopy;;   ESOPHAGOGASTRODUODENOSCOPY (EGD) WITH PROPOFOL N/A 01/06/2016   Dr. Gala Romney: normal s/p empiric dilation    ESOPHAGOGASTRODUODENOSCOPY (EGD) WITH PROPOFOL N/A 08/29/2019   esophageal plaques vs medication residue adherent to tubular esophagus s/p KOH brushing and dilation. Medium-sized hiatal hernia. + for candida. Diflucan.    EYE SURGERY     MALONEY DILATION N/A 01/06/2016   Procedure: Venia Minks DILATION;  Surgeon: Daneil Dolin, MD;  Location: [facility];  Service: Endoscopy;  Laterality: N/A;   MALONEY DILATION N/A 08/29/2019   Procedure: Venia Minks DILATION;  Surgeon: Daneil Dolin, MD;  Location: [facility];  Service: Endoscopy;  Laterality: N/A;   REVERSE SHOULDER ARTHROPLASTY Right 08/06/2017   Procedure: RIGHT REVERSE SHOULDER ARTHROPLASTY;  Surgeon: Netta Cedars, MD;  Location: [facility];  Service: Orthopedics;  Laterality: Right;   WRIST GANGLION EXCISION Left     Current Outpatient Medications  Medication Sig Dispense Refill  Accu-Chek FastClix Lancets MISC Apply topically.     ACCU-CHEK GUIDE test strip 4 (four) times daily.     acetaminophen (TYLENOL) 500 MG tablet Take 1,000 mg by mouth every 6 (six) hours as needed for moderate pain or headache.     albuterol (ACCUNEB) 1.25 MG/3ML nebulizer solution Take 1 ampule by nebulization every 6 (six) hours as needed for wheezing.     Alcohol Swabs (ALCOHOL PADS) 70 %  PADS SMARTSIG:Pledget(s) Topical 4 Times Daily     apixaban (ELIQUIS) 5 MG TABS tablet Take 10 mg by mouth BID through Friday 5/19 evening. Take 61m by mouth BID starting 5/20 AM. (Patient taking differently: Take 5 mg by mouth 2 (two) times daily.) 60 tablet 3   ARNUITY ELLIPTA 100 MCG/ACT AEPB Inhale 1 puff into the lungs at bedtime.   11   Ascorbic Acid (VITAMIN C) 1000 MG tablet Take 1,000 mg by mouth daily.      bisoprolol (ZEBETA) 5 MG tablet One twice daily 60 tablet 11   Blood Glucose Calibration (ACCU-CHEK GUIDE CONTROL) LIQD See admin instructions.     Blood Glucose Monitoring Suppl (ACCU-CHEK GUIDE ME) w/Device KIT 4 (four) times daily.     cetirizine (ZYRTEC) 10 MG tablet Take 10 mg by mouth daily.     Cholecalciferol (VITAMIN D) 2000 units tablet Take 4,000 Units by mouth daily.      cimetidine (TAGAMET) 200 MG tablet Take 0.5 tablets (100 mg total) by mouth daily as needed.     CINNAMON PO Take 2-3 capsules by mouth 2 (two) times daily as needed (high blood sugar).     clobetasol ointment (TEMOVATE) 09.45% Apply 1 application topically 3 (three) times daily as needed (imflammation).     DULoxetine (CYMBALTA) 30 MG capsule Take 1 capsule (30 mg total) by mouth 2 (two) times daily. 30 capsule -0   fluticasone (FLONASE) 50 MCG/ACT nasal spray Place 2 sprays into both nostrils daily.     guaiFENesin (ROBITUSSIN) 100 MG/5ML SOLN Take 5 mLs (100 mg total) by mouth every 4 (four) hours as needed for cough or to loosen phlegm. 236 mL 0   HUMALOG KWIKPEN 100 UNIT/ML KiwkPen Inject 30-47 Units into the skin See admin instructions. If blood sugar is  90-150= 45 units, 150-200= 46 units. 200-250= 47 units at breakfast and supper Lunchtime: If BS is  90-150=30 units,150- 200=31 units,200-250 units 32  5   hydrOXYzine (ATARAX/VISTARIL) 25 MG tablet Take 25 mg by mouth every 8 (eight) hours as needed for itching.      Lancet Devices (EASY MINI EJECT LANCING DEVICE) MISC 4 (four) times daily.      levofloxacin (LEVAQUIN) 500 MG tablet Take 500 mg by mouth daily.     levothyroxine (SYNTHROID) 88 MCG tablet Take 88 mcg by mouth daily before breakfast.     lubiprostone (AMITIZA) 8 MCG capsule TAKE 1 CAPSULE(8 MCG) BY MOUTH TWICE DAILY AS NEEDED FOR CONSTIPATION 60 capsule 5   LYRICA 75 MG capsule Take 75 mg by mouth 2 (two) times daily.      pantoprazole (PROTONIX) 40 MG tablet Take 40 mg by mouth daily.     potassium chloride (KLOR-CON) 10 MEQ tablet Take 10 mEq by mouth daily.     pravastatin (PRAVACHOL) 80 MG tablet Take 1 tablet (80 mg total) by mouth at bedtime. 30 tablet 0   RESTASIS 0.05 % ophthalmic emulsion      torsemide (DEMADEX) 20 MG tablet Take 2 tablets (40 mg total)  by mouth daily. (Patient taking differently: Take 10-20 mg by mouth 2 (two) times daily as needed (swelling).) 60 tablet 0   traMADol (ULTRAM) 50 MG tablet Take 50 mg by mouth 3 (three) times daily as needed for moderate pain.  1   traZODone (DESYREL) 50 MG tablet Take 50 mg by mouth at bedtime.     vitamin B-12 (CYANOCOBALAMIN) 1000 MCG tablet Take 1,000 mcg by mouth daily.     XULTOPHY 100-3.6 UNIT-MG/ML SOPN Inject 85 Units as directed at bedtime.  0   methocarbamol (ROBAXIN) 500 MG tablet Take 1 tablet (500 mg total) by mouth 3 (three) times daily as needed. (Patient not taking: No sig reported) 60 tablet 1   Vitamin D, Ergocalciferol, (DRISDOL) 1.25 MG (50000 UNIT) CAPS capsule Take 50,000 Units by mouth every 7 (seven) days. (Patient not taking: Reported on 09/30/2020)     vitamin E 200 UNIT capsule Take 200 Units by mouth daily.  (Patient not taking: Reported on 09/30/2020)     No current facility-administered medications for this visit.   Allergies:  Tetracyclines & related, Banana, and Penicillins   ROS: No syncope.  Uses oxygen predominantly at nighttime.  Physical Exam: VS:  BP 134/82   Pulse 100   Ht 5' 8.5" (1.74 m)   Wt 222 lb (100.7 kg)   SpO2 98%   BMI 33.26 kg/m , BMI Body mass index is  33.26 kg/m.  Wt Readings from Last 3 Encounters:  09/30/20 222 lb (100.7 kg)  09/02/20 222 lb 3.2 oz (100.8 kg)  07/02/20 228 lb (103.4 kg)    General: Patient appears comfortable at rest. HEENT: Conjunctiva and lids normal, no mass. Neck: Supple, no elevated JVP or carotid bruits, no thyromegaly. Lungs: Clear to auscultation, nonlabored breathing at rest. Cardiac: Regular rate and rhythm, no S3, 1/6 systolic murmur, no pericardial rub. Abdomen: Soft, nontender, bowel sounds present. Extremities: Trace ankle edema, distal pulses 2+.  ECG:  An ECG dated 03/17/2020 was personally reviewed today and demonstrated:  Sinus rhythm with LVH.  Recent Labwork: 03/15/2020: B Natriuretic Peptide 59.0 03/19/2020: Magnesium 2.1 06/28/2020: TSH 0.559 08/26/2020: ALT 44; AST 31; BUN 21; Creatinine, Ser 1.05; Hemoglobin 12.9; Platelets 210; Potassium 4.7; Sodium 134     Component Value Date/Time   CHOL 284 (H) 06/28/2020 1036   TRIG 147 06/28/2020 1036   HDL 74 06/28/2020 1036   CHOLHDL 3.8 06/28/2020 1036   VLDL 29 06/28/2020 1036   LDLCALC 181 (H) 06/28/2020 1036    Other Studies Reviewed Today:  Echocardiogram 06/28/2020:  1. Left ventricular ejection fraction, by estimation, is 50 to 55%. The  left ventricle has low normal function. The left ventricle demonstrates  global hypokinesis. There is mild left ventricular hypertrophy. Left  ventricular diastolic parameters are  indeterminate.   2. Right ventricular systolic function is mildly reduced. The right  ventricular size is normal. Tricuspid regurgitation signal is inadequate  for assessing PA pressure.   3. The mitral valve is grossly normal. Trivial mitral valve  regurgitation.   4. The aortic valve is tricuspid. Aortic valve regurgitation is not  visualized.   5. The inferior vena cava is normal in size with greater than 50%  respiratory variability, suggesting right atrial pressure of 3 mmHg.   Assessment and Plan:  1.   Preoperative cardiac evaluation prior to planned elective liver biopsy under conscious sedation, relatively low risk from a cardiac perspective.  She has a history of chronic diastolic heart failure with low normal  LVEF of 50 to 55%, also mildly reduced RV contraction by her last echocardiogram in May.  Weight is down 6 pounds on current diuretic regimen.  Also history of inappropriate sinus tachycardia, heart rate was up today as she had not yet taken her Zebeta.  I would anticipate she should be able to proceed with planned liver biopsy without further cardiac testing, she will need to be off Eliquis at least 48 hours prior.  2.  History of bilateral lower extremity DVT, on chronic anticoagulation with Eliquis.  Medication Adjustments/Labs and Tests Ordered: Current medicines are reviewed at length with the patient today.  Concerns regarding medicines are outlined above.   Tests Ordered: Orders Placed This Encounter  Procedures   EKG 12-Lead    Medication Changes: No orders of the defined types were placed in this encounter.   Disposition:  Follow up  3 months.  Signed, Satira Sark, MD, [facility] 09/30/2020 9:46 AM    [facility] [place]. [address] [place], [place] [address] Phone: [phone]; Fax: [phone]

## 2020-09-30 ENCOUNTER — Ambulatory Visit (INDEPENDENT_AMBULATORY_CARE_PROVIDER_SITE_OTHER): Payer: Medicare Other | Admitting: Cardiology

## 2020-09-30 ENCOUNTER — Other Ambulatory Visit: Payer: Self-pay

## 2020-09-30 ENCOUNTER — Other Ambulatory Visit (HOSPITAL_COMMUNITY)
Admission: RE | Admit: 2020-09-30 | Discharge: 2020-09-30 | Disposition: A | Discharge status: Home / Self Care | Payer: Medicare Other | Source: Ambulatory Visit | Attending: Family Medicine | Admitting: Family Medicine

## 2020-09-30 ENCOUNTER — Encounter: Payer: Self-pay | Admitting: Cardiology

## 2020-09-30 VITALS — BP 134/82 | HR 100 | Ht 68.5 in | Wt 222.0 lb

## 2020-09-30 DIAGNOSIS — I5032 Chronic diastolic (congestive) heart failure: Secondary | ICD-10-CM | POA: Diagnosis not present

## 2020-09-30 DIAGNOSIS — Z01810 Encounter for preprocedural cardiovascular examination: Secondary | ICD-10-CM | POA: Diagnosis not present

## 2020-09-30 DIAGNOSIS — E1165 Type 2 diabetes mellitus with hyperglycemia: Secondary | ICD-10-CM | POA: Diagnosis not present

## 2020-09-30 DIAGNOSIS — I10 Essential (primary) hypertension: Secondary | ICD-10-CM | POA: Diagnosis not present

## 2020-09-30 DIAGNOSIS — R000 Tachycardia, unspecified: Secondary | ICD-10-CM

## 2020-09-30 DIAGNOSIS — E785 Hyperlipidemia, unspecified: Secondary | ICD-10-CM | POA: Insufficient documentation

## 2020-09-30 DIAGNOSIS — E039 Hypothyroidism, unspecified: Secondary | ICD-10-CM | POA: Diagnosis not present

## 2020-09-30 LAB — LIPID PANEL
Cholesterol: 285 mg/dL — ABNORMAL HIGH (ref 0–200)
HDL: 64 mg/dL (ref >40)
LDL Cholesterol: 195 mg/dL — ABNORMAL HIGH (ref 0–99)
Total CHOL/HDL Ratio: 4.5 RATIO
Triglycerides: 129 mg/dL (ref <150)
VLDL: 26 mg/dL (ref 0–40)

## 2020-09-30 LAB — BASIC METABOLIC PANEL
Anion gap: 7 (ref 5–15)
BUN: 24 mg/dL — ABNORMAL HIGH (ref 8–23)
CO2: 25 mmol/L (ref 22–32)
Calcium: 9.5 mg/dL (ref 8.9–10.3)
Chloride: 102 mmol/L (ref 98–111)
Creatinine, Ser: 1.2 mg/dL — ABNORMAL HIGH (ref 0.44–1.00)
GFR, Estimated: 49 mL/min — ABNORMAL LOW (ref >60)
Glucose, Bld: 181 mg/dL — ABNORMAL HIGH (ref 70–99)
Potassium: 3.7 mmol/L (ref 3.5–5.1)
Sodium: 134 mmol/L — ABNORMAL LOW (ref 135–145)

## 2020-09-30 LAB — TSH: TSH: 0.66 u[IU]/mL (ref 0.350–4.500)

## 2020-09-30 LAB — HEMOGLOBIN A1C
Hgb A1c MFr Bld: 10.4 % — ABNORMAL HIGH (ref 4.8–5.6)
Mean Plasma Glucose: 251.78 mg/dL

## 2020-09-30 NOTE — Patient Instructions (Signed)
Medication Instructions:  Your physician recommends that you continue on your current medications as directed. Please refer to the Current Medication list given to you today.  *If you need a refill on your cardiac medications before your next appointment, please call your pharmacy*   Lab Work: None today  If you have labs (blood work) drawn today and your tests are completely normal, you will receive your results only by: [place] (if you have MyChart) OR A paper copy in the mail If you have any lab test that is abnormal or we need to change your treatment, we will call you to review the results.   Testing/Procedures: None today    Follow-Up: At [facility], you and your health needs are our priority.  As part of our continuing mission to provide you with exceptional heart care, we have created designated Provider Care Teams.  These Care Teams include your primary Cardiologist (physician) and Advanced Practice Providers (APPs -  Physician Assistants and Nurse Practitioners) who all work together to provide you with the care you need, when you need it.  We recommend signing up for the patient portal called "MyChart".  Sign up information is provided on this After Visit Summary.  MyChart is used to connect with patients for Virtual Visits (Telemedicine).  Patients are able to view lab/test results, encounter notes, upcoming appointments, etc.  Non-urgent messages can be sent to your provider as well.   To learn more about what you can do with MyChart, go to [email or web address].    Your next appointment:   3 month(s)  The format for your next appointment:   In Person  Provider:   You may see Rozann Lesches, MD or one of the following Advanced Practice Providers on your designated Care Team:   Bernerd Pho, PA-C     Other Instructions None

## 2020-10-02 ENCOUNTER — Ambulatory Visit (HOSPITAL_COMMUNITY): Admission: RE | Admit: 2020-10-02 | Payer: Medicare Other | Source: Ambulatory Visit

## 2020-10-03 ENCOUNTER — Other Ambulatory Visit: Payer: Self-pay | Admitting: Gastroenterology

## 2020-10-03 ENCOUNTER — Telehealth: Payer: Self-pay | Admitting: Internal Medicine

## 2020-10-03 DIAGNOSIS — K5900 Constipation, unspecified: Secondary | ICD-10-CM

## 2020-10-03 MED ORDER — LUBIPROSTONE 8 MCG PO CAPS: ORAL_CAPSULE | ORAL | 3 refills | Status: DC

## 2020-10-03 NOTE — Telephone Encounter (Signed)
Refill request for lubiprostone 8 mcg capsule. [facility].

## 2020-10-03 NOTE — Telephone Encounter (Signed)
Rx sent 

## 2020-10-03 NOTE — Telephone Encounter (Signed)
Refill request for pantoprazole 40 mg tablet. [facility].

## 2020-10-03 NOTE — Telephone Encounter (Signed)
Pt was made aware.  

## 2020-10-04 MED ORDER — PANTOPRAZOLE SODIUM 40 MG PO TBEC: 40.0000 mg | DELAYED_RELEASE_TABLET | Freq: Every day | ORAL | 3 refills | Status: DC

## 2020-10-04 NOTE — Addendum Note (Signed)
Addended by: Mahala Menghini on: 10/04/2020 09:10 PM   Modules accepted: Orders

## 2020-10-09 ENCOUNTER — Telehealth: Payer: Self-pay

## 2020-10-09 NOTE — Telephone Encounter (Signed)
PA done for Amitiza 8 mcg. Waiting on a decision.

## 2020-10-09 NOTE — Telephone Encounter (Signed)
[facility] sent documentation over wanting [place] to do a PA or switch medications for this pt, well I did a PA and they cancelled the PA I did and want you to switch the pt's medication anyway. I have the documentation for you to go over because they want you to switch it. Pt has already been on Linzess 145 mcg and it didn't work. I will put this on your desk and on the 2nd page of the package is the alternate Rx's.

## 2020-10-10 NOTE — Telephone Encounter (Signed)
noted 

## 2020-10-10 NOTE — Telephone Encounter (Signed)
Roseanne Kaufman, NP has been following this patient. Routing to her for additional recommendations when she returns on Monday. Will place packet of information on her desk for review.

## 2020-10-12 ENCOUNTER — Other Ambulatory Visit: Payer: Self-pay | Admitting: *Deleted

## 2020-10-12 MED ORDER — BISOPROLOL FUMARATE 5 MG PO TABS: ORAL_TABLET | ORAL | 1 refills | Status: DC

## 2020-10-14 ENCOUNTER — Other Ambulatory Visit: Payer: Self-pay | Admitting: Radiology

## 2020-10-14 NOTE — Telephone Encounter (Signed)
Phoned the pt and her vm was full   Put up front 3 boxes of Linzess 290 mcg for the pt.

## 2020-10-14 NOTE — Telephone Encounter (Signed)
Dena, please have patient pick up samples of Linzess 290 mcg. We can see how this works for her.

## 2020-10-15 ENCOUNTER — Ambulatory Visit (HOSPITAL_COMMUNITY)
Admission: RE | Admit: 2020-10-15 | Discharge: 2020-10-15 | Disposition: A | Discharge status: Home / Self Care | Payer: Medicare Other | Source: Ambulatory Visit | Attending: Gastroenterology | Admitting: Gastroenterology

## 2020-10-15 ENCOUNTER — Other Ambulatory Visit: Payer: Self-pay

## 2020-10-15 ENCOUNTER — Encounter (HOSPITAL_COMMUNITY): Payer: Self-pay

## 2020-10-15 DIAGNOSIS — K760 Fatty (change of) liver, not elsewhere classified: Secondary | ICD-10-CM | POA: Insufficient documentation

## 2020-10-15 DIAGNOSIS — R748 Abnormal levels of other serum enzymes: Secondary | ICD-10-CM | POA: Diagnosis present

## 2020-10-15 DIAGNOSIS — R945 Abnormal results of liver function studies: Secondary | ICD-10-CM

## 2020-10-15 DIAGNOSIS — Z87891 Personal history of nicotine dependence: Secondary | ICD-10-CM | POA: Insufficient documentation

## 2020-10-15 DIAGNOSIS — Z79899 Other long term (current) drug therapy: Secondary | ICD-10-CM | POA: Insufficient documentation

## 2020-10-15 DIAGNOSIS — R7989 Other specified abnormal findings of blood chemistry: Secondary | ICD-10-CM

## 2020-10-15 LAB — PROTIME-INR
INR: 1 (ref 0.8–1.2)
Prothrombin Time: 12.9 seconds (ref 11.4–15.2)

## 2020-10-15 LAB — CBC
HCT: 40.7 % (ref 36.0–46.0)
Hemoglobin: 12.7 g/dL (ref 12.0–15.0)
MCH: 25.4 pg — ABNORMAL LOW (ref 26.0–34.0)
MCHC: 31.2 g/dL (ref 30.0–36.0)
MCV: 81.4 fL (ref 80.0–100.0)
Platelets: 243 10*3/uL (ref 150–400)
RBC: 5 MIL/uL (ref 3.87–5.11)
RDW: 13.8 % (ref 11.5–15.5)
WBC: 12.2 10*3/uL — ABNORMAL HIGH (ref 4.0–10.5)
nRBC: 0 % (ref 0.0–0.2)

## 2020-10-15 LAB — GLUCOSE, CAPILLARY: Glucose-Capillary: 156 mg/dL — ABNORMAL HIGH (ref 70–99)

## 2020-10-15 MED ORDER — FENTANYL CITRATE (PF) 100 MCG/2ML IJ SOLN
INTRAMUSCULAR | Status: AC
  Filled 2020-10-15: qty 4

## 2020-10-15 MED ORDER — LIDOCAINE HCL (PF) 1 % IJ SOLN
INTRAMUSCULAR | Status: AC
  Filled 2020-10-15: qty 30

## 2020-10-15 MED ORDER — GELATIN ABSORBABLE 12-7 MM EX MISC
CUTANEOUS | Status: AC
  Filled 2020-10-15: qty 1

## 2020-10-15 MED ORDER — MIDAZOLAM HCL 2 MG/2ML IJ SOLN
INTRAMUSCULAR | Status: AC | PRN
  Administered 2020-10-15 (×2): 1 mg via INTRAVENOUS

## 2020-10-15 MED ORDER — MIDAZOLAM HCL 2 MG/2ML IJ SOLN
INTRAMUSCULAR | Status: AC
  Filled 2020-10-15: qty 4

## 2020-10-15 MED ORDER — SODIUM CHLORIDE 0.9 % IV SOLN: INTRAVENOUS | Status: DC

## 2020-10-15 MED ORDER — FENTANYL CITRATE (PF) 100 MCG/2ML IJ SOLN
INTRAMUSCULAR | Status: AC | PRN
  Administered 2020-10-15 (×2): 50 ug via INTRAVENOUS

## 2020-10-15 NOTE — Progress Notes (Signed)
Pt ambulated without difficulty or bleeding.   Discharged home with her daughters who will drive and stay with pt x 24 hrs.

## 2020-10-15 NOTE — H&P (Addendum)
Chief Complaint: Patient was seen in consultation today for random liver biopsy at the request of Annitta Needs  Referring Physician(s): Annitta Needs  Supervising Physician: Mir, Sharen Heck  Patient Status: [facility] - Out-pt  History of Present Illness: Stephanie [name] is a 70 y.o. female   Known fatty liver disease per GI notes-   GI note 07/02/20: fatty liver with AMA negative, ANA positive, ASMA weakly positive. Negative Hep B, C. Hemochromatosis negative. Immunoglobulins normal.  Followed by oncology with history of nephrotic syndrome, IgG kappa monoclonal gammopathy, observed for now. Liver biopsy 2017 with mild chronic hepatitis, mild fatty liver, non significant fibrosis, non-specific findings and likely secondary to drug-induced etiology.  Recent elevation liver function tests Worrisome for worsening cirrhosis  [place] 08/20/20:IMPRESSION: Subtle nodularity of the hepatic contours with coarsened parenchymal echogenicity suspicious for cirrhosis.   Scheduled now for liver core biopsy   Past Medical History:  Diagnosis Date   Anemia    Arthritis    Asthma    COPD (chronic obstructive pulmonary disease) ([place])    COVID-19    Deep vein thrombosis (DVT) of both lower extremities ([place]) 06/27/2015   Fibromyalgia    GERD (gastroesophageal reflux disease)    H/O hiatal hernia    Hypercholesteremia    Hypertension    Hyperthyroidism    IBS (irritable bowel syndrome)    Inappropriate sinus tachycardia    Inner ear disease    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   Type 2 diabetes mellitus ([place])     Past Surgical History:  Procedure Laterality Date   ABDOMINAL HYSTERECTOMY  partial   CARPAL TUNNEL RELEASE Right 1991   CATARACT EXTRACTION W/PHACO Right 05/08/2013   Procedure: CATARACT EXTRACTION PHACO AND INTRAOCULAR LENS PLACEMENT ([place]);  Surgeon: Tonny Branch, MD;  Location: [facility];  Service: Ophthalmology;  Laterality: Right;  CDE 10.31   CATARACT  EXTRACTION W/PHACO Left 08/17/2013   Procedure: CATARACT EXTRACTION PHACO AND INTRAOCULAR LENS PLACEMENT ([facility]);  Surgeon: Tonny Branch, MD;  Location: [facility];  Service: Ophthalmology;  Laterality: Left;  CDE:9.03   CHOLECYSTECTOMY  1971   COLONOSCOPY WITH PROPOFOL N/A 01/06/2016   Dr. Gala Romney: diverticulosis    DENTAL SURGERY     ESOPHAGEAL BRUSHING  08/29/2019   Procedure: ESOPHAGEAL BRUSHING;  Surgeon: Daneil Dolin, MD;  Location: [facility] SU[facility];  Service: Endoscopy;;   ESOPHAGOGASTRODUODENOSCOPY (EGD) WITH PROPOFOL N/A 01/06/2016   Dr. Gala Romney: normal s/p empiric dilation    ESOPHAGOGASTRODUODENOSCOPY (EGD) WITH PROPOFOL N/A 08/29/2019   esophageal plaques vs medication residue adherent to tubular esophagus s/p KOH brushing and dilation. Medium-sized hiatal hernia. + for candida. Diflucan.    EYE SURGERY     MALONEY DILATION N/A 01/06/2016   Procedure: Venia Minks DILATION;  Surgeon: Daneil Dolin, MD;  Location: [facility];  Service: Endoscopy;  Laterality: N/A;   MALONEY DILATION N/A 08/29/2019   Procedure: Venia Minks DILATION;  Surgeon: Daneil Dolin, MD;  Location: [facility];  Service: Endoscopy;  Laterality: N/A;   REVERSE SHOULDER ARTHROPLASTY Right 08/06/2017   Procedure: RIGHT REVERSE SHOULDER ARTHROPLASTY;  Surgeon: Netta Cedars, MD;  Location: [place];  Service: Orthopedics;  Laterality: Right;   WRIST GANGLION EXCISION Left     Allergies: Tetracyclines & related, Banana, and Penicillins  Medications: Prior to Admission medications   Medication Sig Start Date End Date Taking? Authorizing Provider  acetaminophen (TYLENOL) 500 MG tablet Take 1,000 mg by mouth every 6 (six) hours as needed for moderate pain or  headache.   Yes [provider]  albuterol (PROVENTIL) (2.5 MG/3ML) 0.083% nebulizer solution Take 2.5 mg by nebulization every 6 (six) hours as needed for wheezing or shortness of breath.   Yes [provider]  apixaban (ELIQUIS) 5 MG TABS tablet Take 10 mg by  mouth BID through Friday 5/19 evening. Take 101m by mouth BID starting 5/20 AM. Patient taking differently: Take 5 mg by mouth 2 (two) times daily. 07/25/15  Yes LLendon Colonel NP  ARNUITY ELLIPTA 100 MCG/ACT AEPB Inhale 1 puff into the lungs at bedtime.  06/13/17  Yes [provider]  Ascorbic Acid (VITAMIN C) 1000 MG tablet Take 1,000 mg by mouth daily.    Yes [provider]  cetirizine (ZYRTEC) 10 MG tablet Take 10 mg by mouth daily.   Yes [provider]  Cholecalciferol (VITAMIN D) 2000 units tablet Take 4,000 Units by mouth daily.    Yes [provider]  cimetidine (TAGAMET) 200 MG tablet Take 0.5 tablets (100 mg total) by mouth daily as needed. 03/22/20  Yes MBarton Dubois MD  CINNAMON PO Take 2 capsules by mouth 3 (three) times daily.   Yes [provider]  clobetasol ointment (TEMOVATE) 07.78% Apply 1 application topically 3 (three) times daily as needed (imflammation). 06/20/15  Yes [provider]  DULoxetine (CYMBALTA) 30 MG capsule Take 1 capsule (30 mg total) by mouth 2 (two) times daily. 09/16/17  Yes Hall, Carole N, DO  fluticasone (VERAMYST) 27.5 MCG/SPRAY nasal spray Place 2 sprays into the nose daily.   Yes [provider]  guaiFENesin (ROBITUSSIN) 100 MG/5ML SOLN Take 5 mLs (100 mg total) by mouth every 4 (four) hours as needed for cough or to loosen phlegm. 03/17/20  Yes Shah, Pratik D, DO  HUMALOG KWIKPEN 100 UNIT/ML KiwkPen Inject 1-50 Units into the skin See admin instructions. Inject 50 units with breakfast, 30 units with lunch, and 50 units with dinner. Inject 1-10 units with snacks 09/30/17  Yes [provider]  hydrOXYzine (ATARAX/VISTARIL) 25 MG tablet Take 25 mg by mouth every 8 (eight) hours as needed for itching.  06/20/15  Yes [provider]  levothyroxine (SYNTHROID) 88 MCG tablet Take 88 mcg by mouth daily before breakfast.   Yes [provider]  lubiprostone (AMITIZA) 8 MCG capsule  TAKE 1 CAPSULE(8 MCG) BY MOUTH TWICE DAILY AS NEEDED FOR CONSTIPATION 10/03/20  Yes Harper, Kristen S, PA-C  LYRICA 75 MG capsule Take 75 mg by mouth 2 (two) times daily.  07/18/15  Yes [provider]  methocarbamol (ROBAXIN) 500 MG tablet Take 1 tablet (500 mg total) by mouth 3 (three) times daily as needed. 08/06/17  Yes NNetta Cedars MD  pantoprazole (PROTONIX) 40 MG tablet Take 1 tablet (40 mg total) by mouth daily. 10/04/20  Yes LMahala Menghini PA-C  potassium chloride (KLOR-CON) 10 MEQ tablet Take 10 mEq by mouth daily.   Yes [provider]  pravastatin (PRAVACHOL) 80 MG tablet Take 1 tablet (80 mg total) by mouth at bedtime. 09/16/17  Yes Hall, Carole N, DO  RESTASIS 0.05 % ophthalmic emulsion Place 1 drop into both eyes 2 (two) times daily. 08/21/20  Yes [provider]  torsemide (DEMADEX) 20 MG tablet Take 2 tablets (40 mg total) by mouth daily. Patient taking differently: Take 10-20 mg by mouth daily as needed (swelling). 07/02/15  Yes GSamuella Cota MD  traMADol (ULTRAM) 50 MG tablet Take 50 mg by mouth 3 (three) times daily as  needed for moderate pain. 01/07/18  Yes [provider]  traZODone (DESYREL) 50 MG tablet Take 50-100 mg by mouth at bedtime as needed for sleep.   Yes [provider]  vitamin B-12 (CYANOCOBALAMIN) 1000 MCG tablet Take 1,000 mcg by mouth daily.   Yes [provider]  XULTOPHY 100-3.6 UNIT-MG/ML SOPN Inject 85 Units as directed at bedtime. 03/22/20  Yes Barton Dubois, MD  Accu-Chek FastClix Lancets MISC Apply topically. 03/15/20   [provider]  ACCU-CHEK GUIDE test strip 4 (four) times daily. 07/02/20   [provider]  Alcohol Swabs (ALCOHOL PADS) 70 % PADS SMARTSIG:Pledget(s) Topical 4 Times Daily 08/12/20   [provider]  bisoprolol (ZEBETA) 5 MG tablet One twice daily 10/12/20   Tanda Rockers, MD  Blood Glucose Calibration (ACCU-CHEK GUIDE CONTROL) LIQD See admin instructions.  07/02/20   [provider]  Blood Glucose Monitoring Suppl (ACCU-CHEK GUIDE ME) w/Device KIT 4 (four) times daily. 07/02/20   [provider]  Lancet Devices (EASY MINI EJECT LANCING DEVICE) MISC 4 (four) times daily. 07/02/20   [provider]     Family History  Problem Relation Age of Onset   Hypertension Mother    Diabetes Mother    COPD Mother    Arthritis Mother    Diabetes Father    Arthritis Father    Dementia Father    CAD Father    Hypothyroidism Sister    Diabetes Brother    Colon cancer Niece    Colon polyps Neg Hx     Social History   Socioeconomic History   Marital status: Married    Spouse name: Not on file   Number of children: Not on file   Years of education: Not on file   Highest education level: Not on file  Occupational History   Not on file  Tobacco Use   Smoking status: Former    Packs/day: 0.25    Years: 30.00    Pack years: 7.50    Types: Cigarettes    Quit date: 02/17/2011    Years since quitting: 9.6   Smokeless tobacco: Never  Vaping Use   Vaping Use: Never used  Substance and Sexual Activity   Alcohol use: Yes    Alcohol/week: 0.0 standard drinks    Comment: rare   Drug use: No   Sexual activity: Not Currently    Birth control/protection: Surgical  Other Topics Concern   Not on file  Social History Narrative   Not on file   Social Determinants of Health   Financial Resource Strain: Not on file  Food Insecurity: Not on file  Transportation Needs: Not on file  Physical Activity: Not on file  Stress: Not on file  Social Connections: Not on file    Review of Systems: A 12 point ROS discussed and pertinent positives are indicated in the HPI above.  All other systems are negative.  Review of Systems  Constitutional:  Negative for activity change, fatigue and fever.  Respiratory:  Negative for cough and shortness of breath.   Cardiovascular:  Negative for chest pain.  Gastrointestinal:  Negative for  abdominal pain.  Psychiatric/Behavioral:  Negative for behavioral problems and confusion.    Vital Signs: BP (!) 149/93 (BP Location: Right Arm)   Pulse 94   Temp 98.5 F (36.9 C) (Oral)   Ht 5' 8.5" (1.74 m)   Wt 222 lb 6.4 oz (100.9 kg)   SpO2 98%   BMI 33.32 kg/m  Physical Exam Vitals reviewed.  HENT:     Mouth/Throat:     Mouth: Mucous membranes are moist.  Cardiovascular:     Rate and Rhythm: Normal rate and regular rhythm.     Heart sounds: Normal heart sounds.  Pulmonary:     Effort: Pulmonary effort is normal.     Breath sounds: Normal breath sounds.  Abdominal:     Palpations: Abdomen is soft.  Musculoskeletal:        General: Normal range of motion.  Skin:    General: Skin is warm.  Neurological:     Mental Status: She is alert and oriented to person, place, and time.  Psychiatric:        Behavior: Behavior normal.    Imaging: No results found.  Labs:  CBC: Recent Labs    03/22/20 0322 04/30/20 1132 06/28/20 1034 08/26/20 1123  WBC 12.4* 20.1* 12.3* 12.5*  HGB 13.4 11.2* 12.7 12.9  HCT 43.7 35.9* 41.6 40.6  PLT 295 212 260 210    COAGS: No results for input(s): INR, APTT in the last 8760 hours.  BMP: Recent Labs    04/30/20 1132 06/28/20 1034 08/26/20 1123 09/30/20 0955  NA 136 135 134* 134*  K 3.9 4.0 4.7 3.7  CL 104 98 101 102  CO2 23 30 25 25   GLUCOSE 160* 158* 302* 181*  BUN 17 22 21  24*  CALCIUM 9.1 9.2 9.2 9.5  CREATININE 1.01* 1.13* 1.05* 1.20*  GFRNONAA >60 53* 57* 49*    LIVER FUNCTION TESTS: Recent Labs    03/22/20 0322 04/30/20 1132 06/28/20 1034 08/26/20 1123  BILITOT 0.9 0.6 0.9 0.3  AST 70* 31 29 31   ALT 131* 34 54* 44  ALKPHOS 238* 257* 358* 322*  PROT 6.5 7.7 7.8 7.9  ALBUMIN 2.5* 3.0* 3.1* 3.0*    TUMOR MARKERS: No results for input(s): AFPTM, CEA, CA199, CHROMGRNA in the last 8760 hours.  Assessment and Plan:  Fatty liver disease Recent elevation in LFTs-- worrisome for worsening  Cirrhosis Scheduled for random liver biopsy Risks and benefits of random liver biopsy was discussed with the patient and/or patient's family including, but not limited to bleeding, infection, damage to adjacent structures or low yield requiring additional tests.  All of the questions were answered and there is agreement to proceed.  Consent signed and in chart.    Thank you for this interesting consult.  I greatly enjoyed meeting Archana Eckman Seaberry and look forward to participating in their care.  A copy of this report was sent to the requesting provider on this date.  Electronically Signed: Lavonia Drafts, PA-C 10/15/2020, 11:47 AM   I spent a total of  30 Minutes   in face to face in clinical consultation, greater than 50% of which was counseling/coordinating care for random liver bx

## 2020-10-15 NOTE — Procedures (Signed)
Interventional Radiology Procedure Note  Procedure: US guided random liver biopsy  Indication: Fatty liver disease  Findings: Please refer to procedural dictation for full description.  Complications: None  EBL: < 10 mL  Miachel Roux, MD [phone]

## 2020-10-15 NOTE — Telephone Encounter (Signed)
Phoned pt, LMOVM fr the pt to call back

## 2020-10-16 NOTE — Telephone Encounter (Signed)
Letter mailed to the pt regarding her Linzess  samples

## 2020-10-17 ENCOUNTER — Telehealth: Payer: Self-pay | Admitting: Internal Medicine

## 2020-10-17 LAB — SURGICAL PATHOLOGY

## 2020-10-17 NOTE — Telephone Encounter (Signed)
Returned the pt's call LMOVM

## 2020-10-17 NOTE — Telephone Encounter (Signed)
SEE OTHER PHONE NOTE

## 2020-10-17 NOTE — Telephone Encounter (Signed)
See other phone note

## 2020-10-17 NOTE — Telephone Encounter (Signed)
Pt recalling call asking for Dena. [phone]

## 2020-10-17 NOTE — Telephone Encounter (Signed)
Pt returned call was advised that Linzess 290 mcg was put up front for her to pick up and try. Pt advised to call us in 2 weeks with a report. Pt expressed understanding and will pick up next week

## 2020-10-17 NOTE — Telephone Encounter (Signed)
Patient returned your call. Please call back

## 2020-10-17 NOTE — Progress Notes (Signed)
ERROR

## 2020-11-28 ENCOUNTER — Other Ambulatory Visit (HOSPITAL_COMMUNITY)
Admission: RE | Admit: 2020-11-28 | Discharge: 2020-11-28 | Disposition: A | Discharge status: Home / Self Care | Payer: Medicare Other | Source: Ambulatory Visit | Attending: Family Medicine | Admitting: Family Medicine

## 2020-11-28 ENCOUNTER — Other Ambulatory Visit: Payer: Self-pay

## 2020-11-28 ENCOUNTER — Encounter (HOSPITAL_COMMUNITY): Payer: Self-pay | Admitting: *Deleted

## 2020-11-28 ENCOUNTER — Emergency Department (HOSPITAL_COMMUNITY)
Admission: EM | Admit: 2020-11-28 | Discharge: 2020-11-28 | Disposition: A | Discharge status: Home / Self Care | Payer: Medicare Other | Attending: Emergency Medicine | Admitting: Emergency Medicine

## 2020-11-28 DIAGNOSIS — J45909 Unspecified asthma, uncomplicated: Secondary | ICD-10-CM | POA: Insufficient documentation

## 2020-11-28 DIAGNOSIS — Z7951 Long term (current) use of inhaled steroids: Secondary | ICD-10-CM | POA: Insufficient documentation

## 2020-11-28 DIAGNOSIS — R509 Fever, unspecified: Secondary | ICD-10-CM | POA: Diagnosis not present

## 2020-11-28 DIAGNOSIS — J449 Chronic obstructive pulmonary disease, unspecified: Secondary | ICD-10-CM | POA: Diagnosis not present

## 2020-11-28 DIAGNOSIS — I5032 Chronic diastolic (congestive) heart failure: Secondary | ICD-10-CM | POA: Insufficient documentation

## 2020-11-28 DIAGNOSIS — Z8616 Personal history of COVID-19: Secondary | ICD-10-CM | POA: Insufficient documentation

## 2020-11-28 DIAGNOSIS — Z79899 Other long term (current) drug therapy: Secondary | ICD-10-CM | POA: Insufficient documentation

## 2020-11-28 DIAGNOSIS — K0889 Other specified disorders of teeth and supporting structures: Secondary | ICD-10-CM | POA: Diagnosis present

## 2020-11-28 DIAGNOSIS — E039 Hypothyroidism, unspecified: Secondary | ICD-10-CM | POA: Diagnosis not present

## 2020-11-28 DIAGNOSIS — Z7901 Long term (current) use of anticoagulants: Secondary | ICD-10-CM | POA: Insufficient documentation

## 2020-11-28 DIAGNOSIS — E1165 Type 2 diabetes mellitus with hyperglycemia: Secondary | ICD-10-CM | POA: Diagnosis not present

## 2020-11-28 DIAGNOSIS — E119 Type 2 diabetes mellitus without complications: Secondary | ICD-10-CM | POA: Insufficient documentation

## 2020-11-28 DIAGNOSIS — Z96611 Presence of right artificial shoulder joint: Secondary | ICD-10-CM | POA: Diagnosis not present

## 2020-11-28 DIAGNOSIS — Z87891 Personal history of nicotine dependence: Secondary | ICD-10-CM | POA: Diagnosis not present

## 2020-11-28 DIAGNOSIS — Z794 Long term (current) use of insulin: Secondary | ICD-10-CM | POA: Diagnosis not present

## 2020-11-28 DIAGNOSIS — R1310 Dysphagia, unspecified: Secondary | ICD-10-CM | POA: Insufficient documentation

## 2020-11-28 DIAGNOSIS — I110 Hypertensive heart disease with heart failure: Secondary | ICD-10-CM | POA: Insufficient documentation

## 2020-11-28 DIAGNOSIS — R6884 Jaw pain: Secondary | ICD-10-CM | POA: Diagnosis not present

## 2020-11-28 LAB — LIPID PANEL
Cholesterol: 249 mg/dL — ABNORMAL HIGH (ref 0–200)
HDL: 54 mg/dL (ref >40)
LDL Cholesterol: 172 mg/dL — ABNORMAL HIGH (ref 0–99)
Total CHOL/HDL Ratio: 4.6 RATIO
Triglycerides: 116 mg/dL (ref <150)
VLDL: 23 mg/dL (ref 0–40)

## 2020-11-28 LAB — BASIC METABOLIC PANEL
Anion gap: 5 (ref 5–15)
BUN: 15 mg/dL (ref 8–23)
CO2: 28 mmol/L (ref 22–32)
Calcium: 8.7 mg/dL — ABNORMAL LOW (ref 8.9–10.3)
Chloride: 103 mmol/L (ref 98–111)
Creatinine, Ser: 1.1 mg/dL — ABNORMAL HIGH (ref 0.44–1.00)
GFR, Estimated: 54 mL/min — ABNORMAL LOW (ref >60)
Glucose, Bld: 159 mg/dL — ABNORMAL HIGH (ref 70–99)
Potassium: 4.5 mmol/L (ref 3.5–5.1)
Sodium: 136 mmol/L (ref 135–145)

## 2020-11-28 LAB — TSH: TSH: 2.253 u[IU]/mL (ref 0.350–4.500)

## 2020-11-28 LAB — HEMOGLOBIN A1C
Hgb A1c MFr Bld: 7.5 % — ABNORMAL HIGH (ref 4.8–5.6)
Mean Plasma Glucose: 168.55 mg/dL

## 2020-11-28 MED ORDER — OXYCODONE-ACETAMINOPHEN 5-325 MG PO TABS: 1.0000 | ORAL_TABLET | Freq: Three times a day (TID) | ORAL | 0 refills | Status: AC | PRN

## 2020-11-28 MED ORDER — OXYCODONE-ACETAMINOPHEN 5-325 MG PO TABS
1.0000 | ORAL_TABLET | Freq: Once | ORAL | Status: AC
  Administered 2020-11-28: 1 via ORAL
  Filled 2020-11-28: qty 1

## 2020-11-28 MED ORDER — CLINDAMYCIN HCL 150 MG PO CAPS: 450.0000 mg | ORAL_CAPSULE | Freq: Three times a day (TID) | ORAL | 0 refills | Status: AC

## 2020-11-28 NOTE — ED Triage Notes (Signed)
Pt c/o lower front tooth pain that started Monday. Reports fever of 101 at home.

## 2020-11-28 NOTE — Discharge Instructions (Addendum)
Likely you have a dental infection and started on antibiotics please take as prescribed.  Please bewear this medication can increase your risk of contracting a diarrheal infection: C. difficile, if you developed severe explosive diarrhea with abdominal pain please come back to  for further evaluation.  Please follow-up your dentist for further evaluation.  Come back to the emergency department if you develop chest pain, shortness of breath, severe abdominal pain, uncontrolled nausea, vomiting, diarrhea.

## 2020-11-28 NOTE — ED Provider Notes (Signed)
[facility] EMERGENCY DEPARTMENT Provider Note   CSN: [id number] Arrival date & time: 11/28/20  1442     History Chief Complaint  Patient presents with   Dental Pain    Stephanie [name] is a 70 y.o. female.  HPI  Patient with significant medical history including COPD, DVT, currently on Eliquis, fibromyalgia, hypertension type 2 diabetes presents to the emergency department with chief complaint of a dental pain.  Patient states dental pain started on Tuesday, states she felt pain on her left bottom tooth, states that she has a chip on it unsure of when this actually happened.  The pain started on Tuesday, states she feels pain in that area that radiates up into her left jaw, she has had associated fevers and chills, she has occasional difficulty swallowing but is still able to tolerate p.o., she denies any tongue or throat swelling, denies difficulty breathing, chest pain, shortness of breath, worsening pedal edema.  She states she been taking Tylenol with some relief.  She will see a dentist on that on the 27th, she was advised to be checked out at the emergency department or urgent care for further evaluation.  She has no other complaints at this time.  Denies any alleviating or aggravating factors.  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   Type 2 diabetes mellitus ([place])     Patient Active Problem List   Diagnosis Date Noted   [place] (dyspnea on exertion) 06/18/2020   Chronic diastolic CHF (congestive heart failure) ([place])     COVID-19 03/17/2020   Pneumonia due to COVID-19 virus 03/11/2020   Acute respiratory disease due to COVID-19 virus 03/11/2020   Abnormal LFTs 02/28/2018   TIA (transient ischemic attack) 09/15/2017   Localized osteoarthritis of right shoulder 08/08/2017   Dysphagia 12/16/2015   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   Constipation 10/27/2015   Diarrhea 08/02/2015   Elevated alkaline phosphatase level 07/23/2015   Heme positive stool 07/23/2015   Hypoglycemia 07/01/2015   Deep vein thrombosis (DVT) of both lower extremities ([place]) 06/27/2015   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   Maculopapular rash, generalized 06/27/2015   Alopecia 06/27/2015   Hypothyroidism 06/27/2015   Diverticulitis 10/19/2013   Tobacco abuse 10/19/2013   Obesity 10/19/2013   Diabetes ([place]) 10/19/2013   Hypertension    Anemia    GERD (gastroesophageal reflux disease)    COPD (chronic obstructive pulmonary disease) ([place])     Past Surgical History:  Procedure Laterality Date   ABDOMINAL HYSTERECTOMY  partial   CARPAL TUNNEL RELEASE Right 1991   CATARACT EXTRACTION W/PHACO Right 05/08/2013   Procedure: CATARACT EXTRACTION PHACO AND INTRAOCULAR LENS PLACEMENT ([place]);  Surgeon: Tonny Branch, MD;  Location: [facility];  Service: Ophthalmology;  Laterality: Right;  CDE 10.31   CATARACT EXTRACTION W/PHACO Left 08/17/2013   Procedure: CATARACT EXTRACTION PHACO AND INTRAOCULAR LENS PLACEMENT (I[facility]);  Surgeon: Tonny Branch, MD;  Location: [facility];  Service: Ophthalmology;  Laterality:  Left;  CDE:9.03   CHOLECYSTECTOMY  1971   COLONOSCOPY WITH PROPOFOL N/A 01/06/2016   Dr. Gala Romney: diverticulosis    DENTAL SURGERY     ESOPHAGEAL BRUSHING  08/29/2019   Procedure: ESOPHAGEAL BRUSHING;  Surgeon: Daneil Dolin, MD;  Location: [facility];  Service: Endoscopy;;   ESOPHAGOGASTRODUODENOSCOPY (EGD) WITH PROPOFOL N/A 01/06/2016   Dr. Gala Romney: normal s/p empiric dilation    ESOPHAGOGASTRODUODENOSCOPY (EGD) WITH  PROPOFOL N/A 08/29/2019   esophageal plaques vs medication residue adherent to tubular esophagus s/p KOH brushing and dilation. Medium-sized hiatal hernia. + for candida. Diflucan.    EYE SURGERY     MALONEY DILATION N/A 01/06/2016   Procedure: Venia Minks DILATION;  Surgeon: Daneil Dolin, MD;  Location: [facility];  Service: Endoscopy;  Laterality: N/A;   MALONEY DILATION N/A 08/29/2019   Procedure: Venia Minks DILATION;  Surgeon: Daneil Dolin, MD;  Location: [facility];  Service: Endoscopy;  Laterality: N/A;   REVERSE SHOULDER ARTHROPLASTY Right 08/06/2017   Procedure: RIGHT REVERSE SHOULDER ARTHROPLASTY;  Surgeon: Netta Cedars, MD;  Location: [place];  Service: Orthopedics;  Laterality: Right;   WRIST GANGLION EXCISION Left      OB History   No obstetric history on file.     Family History  Problem Relation Age of Onset   Hypertension Mother    Diabetes Mother    COPD Mother    Arthritis Mother    Diabetes Father    Arthritis Father    Dementia Father    CAD Father    Hypothyroidism Sister    Diabetes Brother    Colon cancer Niece    Colon polyps Neg Hx     Social History   Tobacco Use   Smoking status: Former    Packs/day: 0.25    Years: 30.00    Pack years: 7.50    Types: Cigarettes    Quit date: 02/17/2011    Years since quitting: 9.7   Smokeless tobacco: Never  Vaping Use   Vaping Use: Never used  Substance Use Topics   Alcohol use: Yes    Alcohol/week: 0.0 standard drinks    Comment: rare   Drug use: No    Home Medications Prior to Admission medications   Medication Sig Start Date End Date Taking? Authorizing Provider  clindamycin (CLEOCIN) 150 MG capsule Take 3 capsules (450 mg total) by mouth 3 (three) times daily for 7 days. 11/28/20 12/05/20 Yes Marcello Fennel, PA-C  oxyCODONE-acetaminophen (PERCOCET/ROXICET) 5-325 MG tablet Take 1 tablet by mouth every 8 (eight) hours as needed for up to 3 days for severe pain. 11/28/20 12/01/20 Yes Marcello Fennel, PA-C  Accu-Chek FastClix Lancets MISC Apply topically. 03/15/20   [provider]  ACCU-CHEK GUIDE test strip 4 (four) times daily. 07/02/20   [provider]  acetaminophen (TYLENOL) 500 MG tablet Take 1,000 mg by mouth every 6 (six) hours as needed for moderate pain or headache.    [provider]  albuterol (PROVENTIL) (2.5 MG/3ML) 0.083% nebulizer solution Take 2.5 mg by nebulization every 6 (six) hours as needed for wheezing or shortness of breath.    [provider]  Alcohol Swabs (ALCOHOL PADS) 70 % PADS SMARTSIG:[place]ledget(s) Topical 4 Times Daily 08/12/20   [provider]  apixaban (ELIQUIS) 5 MG TABS tablet Take 10 mg by mouth BID through Friday 5/19 evening. Take 98m by mouth BID starting 5/20 AM. Patient taking differently: Take 5 mg by mouth 2 (two) times daily.  07/25/15   Lendon Colonel, NP  ARNUITY ELLIPTA 100 MCG/ACT AEPB Inhale 1 puff into the lungs at bedtime.  06/13/17   [provider]  Ascorbic Acid (VITAMIN C) 1000 MG tablet Take 1,000 mg by mouth daily.     [provider]  bisoprolol (ZEBETA) 5 MG tablet One twice daily 10/12/20   Tanda Rockers, MD  Blood Glucose Calibration (ACCU-CHEK GUIDE CONTROL) LIQD See admin instructions. 07/02/20   [provider]  Blood Glucose Monitoring Suppl (ACCU-CHEK GUIDE ME) w/Device KIT 4 (four) times daily. 07/02/20   [provider]  cetirizine (ZYRTEC) 10 MG tablet Take 10 mg by mouth daily.    [provider]  Cholecalciferol (VITAMIN D) 2000 units tablet Take 4,000 Units by mouth daily.     [provider]  cimetidine (TAGAMET) 200 MG tablet Take 0.5 tablets (100 mg total) by mouth daily as needed. 03/22/20   Barton Dubois, MD  CINNAMON PO Take 2 capsules by mouth 3 (three) times daily.    [provider]  clobetasol ointment (TEMOVATE) 6.37 % Apply 1 application topically 3 (three) times daily as needed (imflammation).  06/20/15   [provider]  DULoxetine (CYMBALTA) 30 MG capsule Take 1 capsule (30 mg total) by mouth 2 (two) times daily. 09/16/17   Kayleen Memos, DO  fluticasone (VERAMYST) 27.5 MCG/SPRAY nasal spray Place 2 sprays into the nose daily.    [provider]  guaiFENesin (ROBITUSSIN) 100 MG/5ML SOLN Take 5 mLs (100 mg total) by mouth every 4 (four) hours as needed for cough or to loosen phlegm. 03/17/20   Manuella Ghazi, Pratik D, DO  HUMALOG KWIKPEN 100 UNIT/ML KiwkPen Inject 1-50 Units into the skin See admin instructions. Inject 50 units with breakfast, 30 units with lunch, and 50 units with dinner. Inject 1-10 units with snacks 09/30/17   [provider]  hydrOXYzine (ATARAX/VISTARIL) 25 MG tablet Take 25 mg by mouth every 8 (eight) hours as needed for itching.  06/20/15   [provider]  Lancet Devices (EASY MINI EJECT LANCING DEVICE) MISC 4 (four) times daily. 07/02/20   [provider]  levothyroxine (SYNTHROID) 88 MCG tablet Take 88 mcg by mouth daily before breakfast.    [provider]  lubiprostone (AMITIZA) 8 MCG capsule TAKE 1 CAPSULE(8 MCG) BY MOUTH TWICE DAILY AS NEEDED FOR CONSTIPATION 10/03/20   Aliene Altes S, PA-C  LYRICA 75 MG capsule Take 75 mg by mouth 2 (two) times daily.  07/18/15   [provider]  methocarbamol (ROBAXIN) 500 MG tablet Take 1 tablet (500 mg total) by mouth 3 (three) times daily as needed. 08/06/17   Netta Cedars, MD  pantoprazole (PROTONIX) 40 MG tablet Take 1 tablet (40 mg total) by mouth daily. 10/04/20   Mahala Menghini, PA-C  potassium chloride (KLOR-CON) 10 MEQ tablet Take 10 mEq by mouth daily.    [provider]  pravastatin (PRAVACHOL) 80 MG tablet Take 1 tablet (80 mg total) by mouth at bedtime. 09/16/17   Kayleen Memos, DO  RESTASIS 0.05 % ophthalmic emulsion Place 1 drop into both eyes 2 (two) times daily. 08/21/20   [provider]  torsemide (DEMADEX) 20 MG tablet Take 2 tablets (40 mg  total) by mouth daily. Patient taking differently: Take 10-20 mg by mouth daily as needed (swelling). 07/02/15   Samuella Cota, MD  traMADol (ULTRAM) 50 MG tablet Take 50 mg by mouth 3 (three) times daily as needed for moderate  pain. 01/07/18   [provider]  traZODone (DESYREL) 50 MG tablet Take 50-100 mg by mouth at bedtime as needed for sleep.    [provider]  vitamin B-12 (CYANOCOBALAMIN) 1000 MCG tablet Take 1,000 mcg by mouth daily.    [provider]  XULTOPHY 100-3.6 UNIT-MG/ML SOPN Inject 85 Units as directed at bedtime. 03/22/20   Barton Dubois, MD    Allergies    Tetracyclines & related, Banana, and Penicillins  Review of Systems   Review of Systems  Constitutional:  Positive for chills and fever.  HENT:  Positive for dental problem and sore throat. Negative for congestion, drooling, facial swelling, tinnitus, trouble swallowing and voice change.   Eyes:  Negative for visual disturbance.  Respiratory:  Negative for shortness of breath.   Cardiovascular:  Negative for chest pain and leg swelling.  Gastrointestinal:  Negative for abdominal pain.  Genitourinary:  Negative for enuresis.  Musculoskeletal:  Negative for back pain.  Skin:  Negative for rash.  Neurological:  Negative for dizziness.  Hematological:  Does not bruise/bleed easily.   Physical Exam Updated Vital Signs BP (!) 155/84 (BP Location: Left Arm)   Pulse 100   Temp 99.4 F (37.4 C) (Oral)   Resp 18   Ht _0  (1.727 m)   Wt 100.7 kg   SpO2 96%   BMI 33.75 kg/m   Physical Exam Vitals and nursing note reviewed.  Constitutional:      General: She is not in acute distress.    Appearance: She is not ill-appearing.  HENT:     Head: Normocephalic and atraumatic.     Nose: No congestion.     Mouth/Throat:     Mouth: Mucous membranes are moist.     Pharynx: Oropharynx is clear. No oropharyngeal exudate or posterior oropharyngeal erythema.     Comments: Oropharynx is  visualized tongue and uvula are both midline, controlling oral secretions, tonsils were equal and symmetrical bilaterally, no exudates or erythema present.  Patient had noted poor dental hygiene, she appeared to have a small chip in her left bottom front tooth, there is no surrounding erythema or edema around the gumline, gumline was palpated no fluctuant induration present.  No trismus or torticollis noted. Eyes:     Extraocular Movements: Extraocular movements intact.     Conjunctiva/sclera: Conjunctivae normal.  Cardiovascular:     Rate and Rhythm: Normal rate and regular rhythm.     Pulses: Normal pulses.     Heart sounds: No murmur heard.   No friction rub. No gallop.  Pulmonary:     Effort: No respiratory distress.     Breath sounds: No wheezing, rhonchi or rales.  Musculoskeletal:     Right lower leg: No edema.     Left lower leg: No edema.  Skin:    General: Skin is warm and dry.  Neurological:     Mental Status: She is alert.  Psychiatric:        Mood and Affect: Mood normal.    ED Results / Procedures / Treatments   Labs (all labs ordered are listed, but only abnormal results are displayed) Labs Reviewed - No data to display  EKG None  Radiology No results found.  Procedures Procedures   Medications Ordered in ED Medications  oxyCODONE-acetaminophen (PERCOCET/ROXICET) 5-325 MG per tablet 1 tablet (has no administration in time range)    ED Course  I have reviewed the triage vital signs and the nursing notes.  Pertinent labs &  imaging results that were available during my care of the patient were reviewed by me and considered in my medical decision making (see chart for details).    MDM Rules/Calculators/A&P                          Initial impression-patient presents with dental pain.  She is alert, does not appear in acute stress, vital signs are reassuring.  Work-up-due to well-appearing patient, benign physical exam, further lab or imaging not  warranted at this time.  Rule out- I have low suspicion for peritonsillar abscess, retropharyngeal abscess, or Ludwig angina as oropharynx was visualized tongue and uvula were both midline, there is no exudates, erythema or edema noted in the posterior pillars or on/ around tonsils.  Low suspicion for an abscess as gumline were palpated no fluctuance or induration felt.  Low suspicion for periorbital or orbital cellulitis as patient face had no erythematous, patient EOMs were intact, he had no pain with eye movement.  Plan-  Dental pain-unclear etiology possible from dental infection versus a chipped tooth, will start her on antibiotics, provide her with a short course of narcotic medication follow-up with a dentist for further evaluation, gave her strict return precautions.  Vital signs have remained stable, no indication for hospital admission.  Patient discussed with attending and they agreed with assessment and plan.  Patient given at home care as well strict return precautions.  Patient verbalized that they understood agreed to said plan.  Final Clinical Impression(s) / ED Diagnoses Final diagnoses:  Pain, dental    Rx / DC Orders ED Discharge Orders          Ordered    clindamycin (CLEOCIN) 150 MG capsule  3 times daily        11/28/20 1820    oxyCODONE-acetaminophen (PERCOCET/ROXICET) 5-325 MG tablet  Every 8 hours PRN        11/28/20 1822             Marcello Fennel, PA-C 11/28/20 1823    Margette Fast, MD 12/04/20 [phone]

## 2020-12-30 ENCOUNTER — Ambulatory Visit: Payer: Medicare Other | Admitting: Cardiology

## 2021-01-01 NOTE — Progress Notes (Signed)
Cardiology Office Note    Date:  01/02/2021   ID:  Stephanie [name], DOB May 29, 1950, MRN [id number]  PCP:  [name], FNP  Cardiologist: Rozann Lesches, MD    Chief Complaint  Patient presents with   Follow-up    3 month visit    History of Present Illness:    Stephanie [name] is a 70 y.o. female with past medical history of HFpEF, sinus tachycardia, HTN, HLD, Type 2 DM, IBS, history of DVT (on chronic anticoagulation with Eliquis), MGUS and COPD who presents to the office today for 85-monthfollow-up.   She was last examined by Dr. MDomenic Politein 09/2020 and had lost 6 pounds since her prior visit and denied any anginal symptoms. She was being considered for a liver biopsy by IR and was cleared to proceed with her procedure.   In talking with the patient today, she reports having intermittent palpitations which can occur sporadically. Unaware of any specific triggers. She does consume approximately 1 cup of coffee daily and an occasional soda and denies any alcohol use. She has baseline dyspnea on exertion and uses supplemental oxygen as needed during the day and at night. Saturations are appropriate at 98% on room air today. No reported orthopnea, PND or pitting edema. She is listed as taking Torsemide 40 mg daily but reports taking approximately 10 to 262mdaily as needed based off her edema.    Past Medical History:  Diagnosis Date   Anemia    Arthritis    Asthma    COPD (chronic obstructive pulmonary disease) ([place]   COVID-19    Deep vein thrombosis (DVT) of both lower extremities ([place]5/12/2015   Fibromyalgia    GERD (gastroesophageal reflux disease)    H/O hiatal hernia    Hypercholesteremia    Hypertension    Hyperthyroidism    IBS (irritable bowel syndrome)    Inappropriate sinus tachycardia    Inner ear disease    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   Type 2 diabetes mellitus ([place]    Past Surgical History:  Procedure Laterality Date    ABDOMINAL HYSTERECTOMY  partial   CARPAL TUNNEL RELEASE Right 1991   CATARACT EXTRACTION W/PHACO Right 05/08/2013   Procedure: CATARACT EXTRACTION PHACO AND INTRAOCULAR LENS PLACEMENT ([place]  Surgeon: KeTonny BranchMD;  Location: [facility];  Service: Ophthalmology;  Laterality: Right;  CDE 10.31   CATARACT EXTRACTION W/PHACO Left 08/17/2013   Procedure: CATARACT EXTRACTION PHACO AND INTRAOCULAR LENS PLACEMENT (IOC);  Surgeon: KeTonny BranchMD;  Location: [facility];  Service: Ophthalmology;  Laterality: Left;  CDE:9.03   CHOLECYSTECTOMY  1971   COLONOSCOPY WITH PROPOFOL N/A 01/06/2016   Dr. RoGala Romneydiverticulosis    DENTAL SURGERY     ESOPHAGEAL BRUSHING  08/29/2019   Procedure: ESOPHAGEAL BRUSHING;  Surgeon: RoDaneil DolinMD;  Location: [facility];  Service: Endoscopy;;   ESOPHAGOGASTRODUODENOSCOPY (EGD) WITH PROPOFOL N/A 01/06/2016   Dr. RoGala Romneynormal s/p empiric dilation    ESOPHAGOGASTRODUODENOSCOPY (EGD) WITH PROPOFOL N/A 08/29/2019   esophageal plaques vs medication residue adherent to tubular esophagus s/p KOH brushing and dilation. Medium-sized hiatal hernia. + for candida. Diflucan.    EYE SURGERY     MALONEY DILATION N/A 01/06/2016   Procedure: MAVenia MinksILATION;  Surgeon: RoDaneil DolinMD;  Location[facility];  Service: Endoscopy;  Laterality: N/A;   MALONEY DILATION N/A 08/29/2019   Procedure: MAVenia MinksILATION;  Surgeon: RoDaneil DolinMD;  Location: [facility];  Service: Endoscopy;  Laterality: N/A;   REVERSE SHOULDER ARTHROPLASTY Right 08/06/2017   Procedure: RIGHT REVERSE SHOULDER ARTHROPLASTY;  Surgeon: Netta Cedars, MD;  Location: [facility];  Service: Orthopedics;  Laterality: Right;   WRIST GANGLION EXCISION Left     Current Medications: Outpatient Medications Prior to Visit  Medication Sig Dispense Refill   Accu-Chek FastClix Lancets MISC Apply topically.     ACCU-CHEK GUIDE test strip 4 (four) times daily.     acetaminophen (TYLENOL) 500 MG tablet Take 1,000 mg by  mouth every 6 (six) hours as needed for moderate pain or headache.     albuterol (PROVENTIL) (2.5 MG/3ML) 0.083% nebulizer solution Take 2.5 mg by nebulization every 6 (six) hours as needed for wheezing or shortness of breath.     Alcohol Swabs (ALCOHOL PADS) 70 % PADS SMARTSIG:Pledget(s) Topical 4 Times Daily     apixaban (ELIQUIS) 5 MG TABS tablet Take 10 mg by mouth BID through Friday 5/19 evening. Take 98m by mouth BID starting 5/20 AM. (Patient taking differently: Take 5 mg by mouth 2 (two) times daily.) 60 tablet 3   ARNUITY ELLIPTA 100 MCG/ACT AEPB Inhale 1 puff into the lungs at bedtime.   11   Ascorbic Acid (VITAMIN C) 1000 MG tablet Take 1,000 mg by mouth daily.      Blood Glucose Calibration (ACCU-CHEK GUIDE CONTROL) LIQD See admin instructions.     Blood Glucose Monitoring Suppl (ACCU-CHEK GUIDE ME) w/Device KIT 4 (four) times daily.     cetirizine (ZYRTEC) 10 MG tablet Take 10 mg by mouth daily.     Cholecalciferol (VITAMIN D) 2000 units tablet Take 4,000 Units by mouth daily.      cimetidine (TAGAMET) 200 MG tablet Take 0.5 tablets (100 mg total) by mouth daily as needed.     CINNAMON PO Take 2 capsules by mouth 3 (three) times daily.     clobetasol ointment (TEMOVATE) 06.62% Apply 1 application topically 3 (three) times daily as needed (imflammation).     DULoxetine (CYMBALTA) 30 MG capsule Take 1 capsule (30 mg total) by mouth 2 (two) times daily. 30 capsule -0   fluticasone (VERAMYST) 27.5 MCG/SPRAY nasal spray Place 2 sprays into the nose daily.     guaiFENesin (ROBITUSSIN) 100 MG/5ML SOLN Take 5 mLs (100 mg total) by mouth every 4 (four) hours as needed for cough or to loosen phlegm. 236 mL 0   HUMALOG KWIKPEN 100 UNIT/ML KiwkPen Inject 1-50 Units into the skin See admin instructions. Inject 50 units with breakfast, 30 units with lunch, and 50 units with dinner. Inject 1-10 units with snacks  5   hydrOXYzine (ATARAX/VISTARIL) 25 MG tablet Take 25 mg by mouth every 8 (eight) hours  as needed for itching.      Lancet Devices (EASY MINI EJECT LANCING DEVICE) MISC 4 (four) times daily.     levothyroxine (SYNTHROID) 88 MCG tablet Take 88 mcg by mouth daily before breakfast.     lubiprostone (AMITIZA) 8 MCG capsule TAKE 1 CAPSULE(8 MCG) BY MOUTH TWICE DAILY AS NEEDED FOR CONSTIPATION (Patient not taking: Reported on 01/02/2021) 60 capsule 3   LYRICA 75 MG capsule Take 75 mg by mouth 2 (two) times daily.      methocarbamol (ROBAXIN) 500 MG tablet Take 1 tablet (500 mg total) by mouth 3 (three) times daily as needed. 60 tablet 1   pantoprazole (PROTONIX) 40 MG tablet Take 1 tablet (40 mg total) by mouth daily. 90 tablet 3  potassium chloride (KLOR-CON) 10 MEQ tablet Take 10 mEq by mouth daily.     pravastatin (PRAVACHOL) 80 MG tablet Take 1 tablet (80 mg total) by mouth at bedtime. 30 tablet 0   RESTASIS 0.05 % ophthalmic emulsion Place 1 drop into both eyes 2 (two) times daily.     torsemide (DEMADEX) 20 MG tablet Take 2 tablets (40 mg total) by mouth daily. (Patient taking differently: Take 10-20 mg by mouth daily as needed (swelling).) 60 tablet 0   traMADol (ULTRAM) 50 MG tablet Take 50 mg by mouth 3 (three) times daily as needed for moderate pain.  1   traZODone (DESYREL) 50 MG tablet Take 50-100 mg by mouth at bedtime as needed for sleep.     vitamin B-12 (CYANOCOBALAMIN) 1000 MCG tablet Take 1,000 mcg by mouth daily.     XULTOPHY 100-3.6 UNIT-MG/ML SOPN Inject 85 Units as directed at bedtime.  0   bisoprolol (ZEBETA) 5 MG tablet One twice daily 180 tablet 1   No facility-administered medications prior to visit.     Allergies:   Tetracyclines & related, Banana, and Penicillins   Social History   Socioeconomic History   Marital status: Married    Spouse name: Not on file   Number of children: Not on file   Years of education: Not on file   Highest education level: Not on file  Occupational History   Not on file  Tobacco Use   Smoking status: Former     Packs/day: 0.25    Years: 30.00    Pack years: 7.50    Types: Cigarettes    Quit date: 02/17/2011    Years since quitting: 9.8   Smokeless tobacco: Never  Vaping Use   Vaping Use: Never used  Substance and Sexual Activity   Alcohol use: Not Currently    Comment: rare   Drug use: No   Sexual activity: Not Currently    Birth control/protection: Surgical  Other Topics Concern   Not on file  Social History Narrative   Not on file   Social Determinants of Health   Financial Resource Strain: Not on file  Food Insecurity: Not on file  Transportation Needs: Not on file  Physical Activity: Not on file  Stress: Not on file  Social Connections: Not on file     Family History:  The patient's family history includes Arthritis in her father and mother; CAD in her father; COPD in her mother; Colon cancer in her niece; Dementia in her father; Diabetes in her brother, father, and mother; Hypertension in her mother; Hypothyroidism in her sister.   Review of Systems:    Please see the history of present illness.     All other systems reviewed and are otherwise negative except as noted above.   Physical Exam:    VS:  BP 138/82   Pulse 90   Ht 5' 8" (1.727 m)   Wt 227 lb (103 kg)   SpO2 98%   BMI 34.52 kg/m    General: Well developed, well nourished,female appearing in no acute distress. Head: Normocephalic, atraumatic. Neck: No carotid bruits. JVD not elevated.  Lungs: Respirations regular and unlabored, without wheezes or rales.  Heart: Regular rate and rhythm. No S3 or S4.  No murmur, no rubs, or gallops appreciated. Abdomen: Appears non-distended. No obvious abdominal masses. Msk:  Strength and tone appear normal for age. No obvious joint deformities or effusions. Extremities: No clubbing or cyanosis. No pitting edema.  Distal pedal pulses  are 2+ bilaterally. Neuro: Alert and oriented X 3. Moves all extremities spontaneously. No focal deficits noted. Psych:  Responds to  questions appropriately with a normal affect. Skin: No rashes or lesions noted  Wt Readings from Last 3 Encounters:  01/02/21 227 lb (103 kg)  11/28/20 222 lb (100.7 kg)  10/15/20 222 lb 6.4 oz (100.9 kg)     Studies/Labs Reviewed:   EKG:  EKG is not ordered today.    Recent Labs: 03/15/2020: B Natriuretic Peptide 59.0 03/19/2020: Magnesium 2.1 08/26/2020: ALT 44 10/15/2020: Hemoglobin 12.7; Platelets 243 11/28/2020: BUN 15; Creatinine, Ser 1.10; Potassium 4.5; Sodium 136; TSH 2.253   Lipid Panel    Component Value Date/Time   CHOL 249 (H) 11/28/2020 1111   TRIG 116 11/28/2020 1111   HDL 54 11/28/2020 1111   CHOLHDL 4.6 11/28/2020 1111   VLDL 23 11/28/2020 1111   LDLCALC 172 (H) 11/28/2020 1111    Additional studies/ records that were reviewed today include:   Echocardiogram: 06/2020 IMPRESSIONS     1. Left ventricular ejection fraction, by estimation, is 50 to 55%. The  left ventricle has low normal function. The left ventricle demonstrates  global hypokinesis. There is mild left ventricular hypertrophy. Left  ventricular diastolic parameters are  indeterminate.   2. Right ventricular systolic function is mildly reduced. The right  ventricular size is normal. Tricuspid regurgitation signal is inadequate  for assessing PA pressure.   3. The mitral valve is grossly normal. Trivial mitral valve  regurgitation.   4. The aortic valve is tricuspid. Aortic valve regurgitation is not  visualized.   5. The inferior vena cava is normal in size with greater than 50%  respiratory variability, suggesting right atrial pressure of 3 mmHg.   Assessment:    1. Palpitations   2. Chronic diastolic heart failure ([place])   3. History of DVT of lower extremity   4. Hyperlipidemia, unspecified hyperlipidemia type      Plan:   In order of problems listed above:  1. Palpitations - She has a history of sinus tachycardia by review of notes and does experience palpitations on a daily  basis which occur sporadically and spontaneously improve. She is currently taking Bisoprolol 1m BID and will titrate to 154mBID to see if this helps with her symptoms. If symptoms persist, could consider an event monitor as it does not appear this has been ordered in several years.   2. HFpEF - Echo in 06/2020 showed her EF was at 50-55%. She does have baseline dyspnea on exertion and uses supplemental oxygen as needed but denies any specific orthopnea, PND or pitting edema. She continues to take Torsemide 10-2096maily depending on her symptoms and weight. Will titrate Bisoprolol to 58m33mD as outlined above.   3. History of recurrent DVT's - She remains on Eliquis 5mg 37m for anticoagulation and denies any evidence of active bleeding.   4. HLD - LDL was at 172 in 11/2020 but she had been off statin therapy intermittently. Now back on Pravastatin 80mg 46my. If LDL remains above goal, would recommend switching to Crestor.    Medication Adjustments/Labs and Tests Ordered: Current medicines are reviewed at length with the patient today.  Concerns regarding medicines are outlined above.  Medication changes, Labs and Tests ordered today are listed in the Patient Instructions below. Patient Instructions  Medication Instructions:   Increase Bisoprolol to 10 mg Two Times Daily   *If you need a refill on your cardiac medications before your next  appointment, please call your pharmacy*   Lab Work: NONE   If you have labs (blood work) drawn today and your tests are completely normal, you will receive your results only by: [place] (if you have MyChart) OR A paper copy in the mail If you have any lab test that is abnormal or we need to change your treatment, we will call you to review the results.   Testing/Procedures: NONE    Follow-Up: At [facility], you and your health needs are our priority.  As part of our continuing mission to provide you with exceptional heart care, we  have created designated Provider Care Teams.  These Care Teams include your primary Cardiologist (physician) and Advanced Practice Providers (APPs -  Physician Assistants and Nurse Practitioners) who all work together to provide you with the care you need, when you need it.  We recommend signing up for the patient portal called "MyChart".  Sign up information is provided on this After Visit Summary.  MyChart is used to connect with patients for Virtual Visits (Telemedicine).  Patients are able to view lab/test results, encounter notes, upcoming appointments, etc.  Non-urgent messages can be sent to your provider as well.   To learn more about what you can do with MyChart, go to [email or web address].    Your next appointment:   6 month(s)  The format for your next appointment:   In Person  Provider:   Rozann Lesches, MD    Other Instructions Thank you for choosing [facility]!     Signed, Erma Heritage, PA-C  01/02/2021 7:21 PM    [place] [address] [place], [place] [address] Phone: [phone] Fax: [phone]

## 2021-01-02 ENCOUNTER — Encounter: Payer: Self-pay | Admitting: Student

## 2021-01-02 ENCOUNTER — Ambulatory Visit (INDEPENDENT_AMBULATORY_CARE_PROVIDER_SITE_OTHER): Payer: Medicare Other | Admitting: Student

## 2021-01-02 ENCOUNTER — Other Ambulatory Visit: Payer: Self-pay

## 2021-01-02 ENCOUNTER — Other Ambulatory Visit (HOSPITAL_COMMUNITY)
Admission: RE | Admit: 2021-01-02 | Discharge: 2021-01-02 | Disposition: A | Discharge status: Home / Self Care | Payer: Medicare Other | Source: Ambulatory Visit | Attending: Family Medicine | Admitting: Family Medicine

## 2021-01-02 VITALS — BP 138/82 | HR 90 | Ht 68.0 in | Wt 227.0 lb

## 2021-01-02 DIAGNOSIS — E785 Hyperlipidemia, unspecified: Secondary | ICD-10-CM

## 2021-01-02 DIAGNOSIS — Z86718 Personal history of other venous thrombosis and embolism: Secondary | ICD-10-CM

## 2021-01-02 DIAGNOSIS — R002 Palpitations: Secondary | ICD-10-CM

## 2021-01-02 DIAGNOSIS — I5032 Chronic diastolic (congestive) heart failure: Secondary | ICD-10-CM | POA: Diagnosis not present

## 2021-01-02 DIAGNOSIS — E1140 Type 2 diabetes mellitus with diabetic neuropathy, unspecified: Secondary | ICD-10-CM | POA: Diagnosis present

## 2021-01-02 LAB — HEMOGLOBIN A1C
Hgb A1c MFr Bld: 8.9 % — ABNORMAL HIGH (ref 4.8–5.6)
Mean Plasma Glucose: 208.73 mg/dL

## 2021-01-02 MED ORDER — BISOPROLOL FUMARATE 10 MG PO TABS: 10.0000 mg | ORAL_TABLET | Freq: Two times a day (BID) | ORAL | 3 refills | Status: DC

## 2021-01-02 NOTE — Patient Instructions (Signed)
Medication Instructions:   Increase Bisoprolol to 10 mg Two Times Daily   *If you need a refill on your cardiac medications before your next appointment, please call your pharmacy*   Lab Work: NONE   If you have labs (blood work) drawn today and your tests are completely normal, you will receive your results only by: [place] (if you have MyChart) OR A paper copy in the mail If you have any lab test that is abnormal or we need to change your treatment, we will call you to review the results.   Testing/Procedures: NONE    Follow-Up: At [facility], you and your health needs are our priority.  As part of our continuing mission to provide you with exceptional heart care, we have created designated Provider Care Teams.  These Care Teams include your primary Cardiologist (physician) and Advanced Practice Providers (APPs -  Physician Assistants and Nurse Practitioners) who all work together to provide you with the care you need, when you need it.  We recommend signing up for the patient portal called "MyChart".  Sign up information is provided on this After Visit Summary.  MyChart is used to connect with patients for Virtual Visits (Telemedicine).  Patients are able to view lab/test results, encounter notes, upcoming appointments, etc.  Non-urgent messages can be sent to your provider as well.   To learn more about what you can do with MyChart, go to [email or web address].    Your next appointment:   6 month(s)  The format for your next appointment:   In Person  Provider:   Rozann Lesches, MD    Other Instructions Thank you for choosing [facility]!

## 2021-01-21 ENCOUNTER — Other Ambulatory Visit: Payer: Self-pay

## 2021-01-21 ENCOUNTER — Ambulatory Visit (INDEPENDENT_AMBULATORY_CARE_PROVIDER_SITE_OTHER): Payer: Medicare Other | Admitting: Gastroenterology

## 2021-01-21 ENCOUNTER — Encounter: Payer: Self-pay | Admitting: Gastroenterology

## 2021-01-21 VITALS — BP 160/92 | HR 96 | Temp 97.1°F | Ht 68.0 in | Wt 227.4 lb

## 2021-01-21 DIAGNOSIS — K219 Gastro-esophageal reflux disease without esophagitis: Secondary | ICD-10-CM | POA: Diagnosis not present

## 2021-01-21 DIAGNOSIS — K7581 Nonalcoholic steatohepatitis (NASH): Secondary | ICD-10-CM | POA: Insufficient documentation

## 2021-01-21 DIAGNOSIS — K5900 Constipation, unspecified: Secondary | ICD-10-CM | POA: Diagnosis not present

## 2021-01-21 MED ORDER — LUBIPROSTONE 8 MCG PO CAPS: 8.0000 ug | ORAL_CAPSULE | Freq: Two times a day (BID) | ORAL | 3 refills | Status: DC

## 2021-01-21 NOTE — Progress Notes (Signed)
Referring Provider: [name], FNP Primary Care Physician:  [name], FNP Primary GI: Dr. Gala Romney   Chief Complaint  Patient presents with   Gastroesophageal Reflux    F/u.    Constipation    Linzess 279mg did help with constipation    HPI:   Stephanie MCCAULis a 70y.o. female presenting today with a history of GERD, constipation, elevated mixed pattern LFTs with thorough evaluation of in setting of fatty liver with AMA negative, ANA positive, ASMA weakly positive. Negative Hep B, C. Hemochromatosis negative. Immunoglobulins normal.  Followed by oncology with history of nephrotic syndrome, IgG kappa monoclonal gammopathy, observed for now. Liver biopsy 2017 with mild chronic hepatitis, mild fatty liver, non significant fibrosis, non-specific findings and likely secondary to drug-induced etiology. Updated liver biopsy this year consistent with NASH and mild fibrosis. No evidence for autoimmune hepatitis or iron overload.   Previously has been Amitiza. Had taken Linzess in the past. Has BM when urinating. No diarrhea. Taking leftover Amitiza right now. Only takes prn. Feels like this works well for her.   Pantroprazole once daily. No blood in stool. No abdominal pain. Has to make herself eat. Weight flucutates but overall stable.   No longer on oxygen except as needed.   Past Medical History:  Diagnosis Date   Anemia    Arthritis    Asthma    COPD (chronic obstructive pulmonary disease) ([facility]    COVID-19    Deep vein thrombosis (DVT) of both lower extremities ([place] 06/27/2015   Fibromyalgia    GERD (gastroesophageal reflux disease)    H/O hiatal hernia    Hypercholesteremia    Hypertension    Hyperthyroidism    IBS (irritable bowel syndrome)    Inappropriate sinus tachycardia    Inner ear disease    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   Type 2 diabetes mellitus ([place]     Past Surgical History:  Procedure Laterality Date   ABDOMINAL  HYSTERECTOMY  partial   CARPAL TUNNEL RELEASE Right 1991   CATARACT EXTRACTION W/PHACO Right 05/08/2013   Procedure: CATARACT EXTRACTION PHACO AND INTRAOCULAR LENS PLACEMENT ([place];  Surgeon: KTonny Branch MD;  Location: [facility];  Service: Ophthalmology;  Laterality: Right;  CDE 10.31   CATARACT EXTRACTION W/PHACO Left 08/17/2013   Procedure: CATARACT EXTRACTION PHACO AND INTRAOCULAR LENS PLACEMENT (IOC);  Surgeon: KTonny Branch MD;  Location: [facility];  Service: Ophthalmology;  Laterality: Left;  CDE:9.03   CHOLECYSTECTOMY  1971   COLONOSCOPY WITH PROPOFOL N/A 01/06/2016   Dr. RGala Romney diverticulosis    DENTAL SURGERY     ESOPHAGEAL BRUSHING  08/29/2019   Procedure: ESOPHAGEAL BRUSHING;  Surgeon: RDaneil Dolin MD;  Location: [facility];  Service: Endoscopy;;   ESOPHAGOGASTRODUODENOSCOPY (EGD) WITH PROPOFOL N/A 01/06/2016   Dr. RGala Romney normal s/p empiric dilation    ESOPHAGOGASTRODUODENOSCOPY (EGD) WITH PROPOFOL N/A 08/29/2019   esophageal plaques vs medication residue adherent to tubular esophagus s/p KOH brushing and dilation. Medium-sized hiatal hernia. + for candida. Diflucan.    EYE SURGERY     MALONEY DILATION N/A 01/06/2016   Procedure: MVenia MinksDILATION;  Surgeon: RDaneil Dolin MD;  Location: [facility];  Service: Endoscopy;  Laterality: N/A;   MALONEY DILATION N/A 08/29/2019   Procedure: MVenia MinksDILATION;  Surgeon: RDaneil Dolin MD;  Location: [facility];  Service: Endoscopy;  Laterality: N/A;   REVERSE SHOULDER ARTHROPLASTY Right 08/06/2017   Procedure: RIGHT REVERSE SHOULDER ARTHROPLASTY;  Surgeon: NVeverly Fells  Richardson Landry, MD;  Location: [place];  Service: Orthopedics;  Laterality: Right;   WRIST GANGLION EXCISION Left     Current Outpatient Medications  Medication Sig Dispense Refill   Accu-Chek FastClix Lancets MISC Apply topically.     ACCU-CHEK GUIDE test strip 4 (four) times daily.     acetaminophen (TYLENOL) 500 MG tablet Take 1,000 mg by mouth every 6 (six) hours as needed for  moderate pain or headache.     albuterol (PROVENTIL) (2.5 MG/3ML) 0.083% nebulizer solution Take 2.5 mg by nebulization every 6 (six) hours as needed for wheezing or shortness of breath.     Alcohol Swabs (ALCOHOL PADS) 70 % PADS SMARTSIG:Pledget(s) Topical 4 Times Daily     apixaban (ELIQUIS) 5 MG TABS tablet Take 10 mg by mouth BID through Friday 5/19 evening. Take 68m by mouth BID starting 5/20 AM. (Patient taking differently: Take 5 mg by mouth 2 (two) times daily.) 60 tablet 3   ARNUITY ELLIPTA 100 MCG/ACT AEPB Inhale 1 puff into the lungs at bedtime.   11   Ascorbic Acid (VITAMIN C) 1000 MG tablet Take 1,000 mg by mouth daily.      bisoprolol (ZEBETA) 10 MG tablet Take 1 tablet (10 mg total) by mouth in the morning and at bedtime. 180 tablet 3   Blood Glucose Calibration (ACCU-CHEK GUIDE CONTROL) LIQD See admin instructions.     Blood Glucose Monitoring Suppl (ACCU-CHEK GUIDE ME) w/Device KIT 4 (four) times daily.     cetirizine (ZYRTEC) 10 MG tablet Take 10 mg by mouth daily.     Cholecalciferol (VITAMIN D) 2000 units tablet Take 4,000 Units by mouth daily.      cimetidine (TAGAMET) 200 MG tablet Take 0.5 tablets (100 mg total) by mouth daily as needed.     CINNAMON PO Take 2 capsules by mouth 3 (three) times daily.     clobetasol ointment (TEMOVATE) 02.56% Apply 1 application topically 3 (three) times daily as needed (imflammation).     DULoxetine (CYMBALTA) 30 MG capsule Take 1 capsule (30 mg total) by mouth 2 (two) times daily. 30 capsule -0   fluticasone (VERAMYST) 27.5 MCG/SPRAY nasal spray Place 2 sprays into the nose daily.     guaiFENesin (ROBITUSSIN) 100 MG/5ML SOLN Take 5 mLs (100 mg total) by mouth every 4 (four) hours as needed for cough or to loosen phlegm. 236 mL 0   HUMALOG KWIKPEN 100 UNIT/ML KiwkPen Inject 1-50 Units into the skin See admin instructions. Inject 50 units with breakfast, 30 units with lunch, and 50 units with dinner. Inject 1-10 units with snacks  5    hydrOXYzine (ATARAX/VISTARIL) 25 MG tablet Take 25 mg by mouth every 8 (eight) hours as needed for itching.      Lancet Devices (EASY MINI EJECT LANCING DEVICE) MISC 4 (four) times daily.     levothyroxine (SYNTHROID) 88 MCG tablet Take 88 mcg by mouth daily before breakfast.     lubiprostone (AMITIZA) 8 MCG capsule TAKE 1 CAPSULE(8 MCG) BY MOUTH TWICE DAILY AS NEEDED FOR CONSTIPATION 60 capsule 3   lubiprostone (AMITIZA) 8 MCG capsule Take 1 capsule (8 mcg total) by mouth 2 (two) times daily with a meal. 60 capsule 3   LYRICA 75 MG capsule Take 75 mg by mouth 2 (two) times daily.      methocarbamol (ROBAXIN) 500 MG tablet Take 1 tablet (500 mg total) by mouth 3 (three) times daily as needed. 60 tablet 1   pantoprazole (PROTONIX) 40 MG tablet  Take 1 tablet (40 mg total) by mouth daily. 90 tablet 3   potassium chloride (KLOR-CON) 10 MEQ tablet Take 10 mEq by mouth daily.     pravastatin (PRAVACHOL) 80 MG tablet Take 1 tablet (80 mg total) by mouth at bedtime. 30 tablet 0   RESTASIS 0.05 % ophthalmic emulsion Place 1 drop into both eyes 2 (two) times daily.     torsemide (DEMADEX) 20 MG tablet Take 2 tablets (40 mg total) by mouth daily. (Patient taking differently: Take 10-20 mg by mouth daily as needed (swelling).) 60 tablet 0   traMADol (ULTRAM) 50 MG tablet Take 50 mg by mouth 3 (three) times daily as needed for moderate pain.  1   traZODone (DESYREL) 50 MG tablet Take 50-100 mg by mouth at bedtime as needed for sleep.     vitamin B-12 (CYANOCOBALAMIN) 1000 MCG tablet Take 1,000 mcg by mouth daily.     XULTOPHY 100-3.6 UNIT-MG/ML SOPN Inject 85 Units as directed at bedtime.  0   No current facility-administered medications for this visit.    Allergies as of 01/21/2021 - Review Complete 01/21/2021  Allergen Reaction Noted   Tetracyclines & related Anaphylaxis 09/19/2010   Banana Hives and Nausea And Vomiting 05/04/2013   Penicillins Rash and Other (See Comments) 09/19/2010    Family  History  Problem Relation Age of Onset   Hypertension Mother    Diabetes Mother    COPD Mother    Arthritis Mother    Diabetes Father    Arthritis Father    Dementia Father    CAD Father    Hypothyroidism Sister    Diabetes Brother    Colon cancer Niece    Colon polyps Neg Hx     Social History   Socioeconomic History   Marital status: Married    Spouse name: Not on file   Number of children: Not on file   Years of education: Not on file   Highest education level: Not on file  Occupational History   Not on file  Tobacco Use   Smoking status: Former    Packs/day: 0.25    Years: 30.00    Pack years: 7.50    Types: Cigarettes    Quit date: 02/17/2011    Years since quitting: 9.9   Smokeless tobacco: Never  Vaping Use   Vaping Use: Never used  Substance and Sexual Activity   Alcohol use: Not Currently    Comment: rare   Drug use: No   Sexual activity: Not Currently    Birth control/protection: Surgical  Other Topics Concern   Not on file  Social History Narrative   Not on file   Social Determinants of Health   Financial Resource Strain: Not on file  Food Insecurity: Not on file  Transportation Needs: Not on file  Physical Activity: Not on file  Stress: Not on file  Social Connections: Not on file    Review of Systems: Gen: Denies fever, chills, anorexia. Denies fatigue, weakness, weight loss.  CV: Denies chest pain, palpitations, syncope, peripheral edema, and claudication. Resp: Denies dyspnea at rest, cough, wheezing, coughing up blood, and pleurisy. GI: see HPI Derm: Denies rash, itching, dry skin Psych: Denies depression, anxiety, memory loss, confusion. No homicidal or suicidal ideation.  Heme: Denies bruising, bleeding, and enlarged lymph nodes.  Physical Exam: BP (!) 160/92   Pulse 96   Temp (!) 97.1 F (36.2 C)   Ht 5' 8"  (1.727 m)   Wt 227 lb 6.4  oz (103.1 kg)   BMI 34.58 kg/m  General:   Alert and oriented. No distress noted. Pleasant  and cooperative.  Head:  Normocephalic and atraumatic. Eyes:  Conjuctiva clear without scleral icterus. Mouth:  mask in place Abdomen:  +BS, soft, non-tender and non-distended. No rebound or guarding. No HSM or masses noted. Msk:  Symmetrical without gross deformities. Normal posture. Extremities:  Without edema. Neurologic:  Alert and  oriented x4 Psych:  Alert and cooperative. Normal mood and affect.  ASSESSMENT: Stephanie [name] is a 70 y.o. female presenting today with a history of GERD, constipation, elevated mixed pattern LFTs with thorough evaluation as noted above including liver biopsy consistent with NASH and mild fibrosis. Returning in follow-up.  Constipation: managed with just prn Amitiza 8 mcg, which we will continue.   GERD: controlled on PPI.    She has serial labs with Oncology, and HFP is part of this. We discussed continued serial follow-up and NASH management.  PLAN:  Amitiza prn Return in 1 year Call if any concerns in interim   Annitta Needs, PhD, [facility] Gastroenterology

## 2021-01-21 NOTE — Patient Instructions (Addendum)
We will see you in 1 year!  I sent in [place] again. We can work on a prior authorization if needed.  Your liver biopsy showed NASH (non-alcoholic steatohepatitis). It's important to have good control of your other medical issues as you are doing (diabetes, cholesterol, etc). I have included a handout on healthy foods and more detailed info so that you can have this at home as well.   Please call if any concerns in the meantime!   I enjoyed seeing you again today! As you know, I value our relationship and want to provide genuine, compassionate, and quality care. I welcome your feedback. If you receive a survey regarding your visit,  I greatly appreciate you taking time to fill this out. See you next time!  Annitta Needs, PhD, ANP-BC [facility] Gastroenterology

## 2021-01-29 ENCOUNTER — Ambulatory Visit: Payer: Medicare Other | Admitting: Internal Medicine

## 2021-01-29 NOTE — Progress Notes (Deleted)
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 Repeat CXR in 6 weeks to assure complete resolution of infiltrates.     Discharge Diagnoses:  Principal Problem:   Acute respiratory disease due to COVID-19 virus Active Problems:   Hypertension   COPD (chronic obstructive pulmonary disease) (HCC)   MGUS (monoclonal gammopathy of unknown significance)   Pneumonia due to COVID-19 virus   COVID-19   Chronic diastolic CHF (congestive heart failure) ([place])     Discharge Condition: Stable and improved.  Discharged home with instruction to follow-up with PCP in 2 weeks.   CODE STATUS: Full code   Diet recommendation: Heart healthy modified to 100 diet.          Filed Weights    03/17/20 1902 03/18/20 0430  Weight: 103 kg 101.1 kg      History of present illness:  JANESSA MICKLE is a 70 y.o. female with medical history significant of Covid pneumonia just discharged today to home from the hospital after completing remdesivir on steroids.  Patient normally is on 2 L of oxygen and was discharged on 6 L with exertion but at rest was only requiring 4 L.  She was transferred by ambulance home by the time she got home she was extremely short of breath and hypoxic.  Patient was readmitted for acute hypoxemic respiratory failure in the setting of Covid pneumonia.  Pulmonology following.   Hospital Course:   Acute hypoxemic respiratory failure secondary to COVID-19 pneumonia -Continue steroids and wean off O2 supplementation as tolerated.    -currently using 2L at rest and 3.5-4L on exertion -continue slow steroids tapering. -Procalcitonin low, not needing antibiotics currently -Continue breathing treatments as needed -Appreciate pulmonology consultation and rec's   Acute COPD exacerbation related to above -IV steroids and breathing treatments as noted above -no wheezing on exam.   Hypokalemia -Repleted -safe to resume diuretics and daily supplementation. -follow electrolytes trend   Type 2 diabetes with hyperglycemia-improved -Resume home adjusted hypoglycemic regimen -advise to follow low carb diet. -Recent A1c of 9.2% -expecting improvement in her CBG's while tapering off steroids.    CKD stage IIIa with AKI -Improved and back to baseline at discharge -Safe to resume the use of metolazone and Demadex -Continue to maintain adequate hydration -Repeat basic metabolic panel at follow-up visit to assess electrolytes and renal function and stability.   Hypertension -stable -follow heart healthy diet. -Resume home antihypertensive agents.   Hypothyroidism -Continue Synthroid   Class 1 Obesity -low calorie diet, portion control and increase physical activity recommended.  -Body mass index is 33.89 kg/m.   Chronic diastolic heart failure -Stable and compensated -Continue to follow daily weights, low-sodium diet and resume home diuretic regimen.       History of Present Illness  06/18/2020  Pulmonary/ 1st office eval/ Jesyka Slaght / [facility] Office  Chief Complaint  Patient presents with   Pulmonary  Consult    Referred by Dr Manuella Ghazi.  Pt had covid 06 Mar 2020- admitted to [facility] and sent home with supplemental o2. She gets winded with exertion such as cooking and cleaning her home. She uses her albuterol inhaler 2 x daily on average.   Dyspnea:  Room to room at home / scooter for  shopping due to back > breathing on 2lpm  Cough: worse since covid / prednisone rx per PCP x 4 more days Sleep: flat bed with 3 pillows worse cough  SABA use: twice daily as above   rec Stop metaprolol  Bisoprolol 5 mg twice daily - call med to fine tune it.  Change pantoprazole 40 mg Take 30- 60 min before your first and last meals of the day  GERD Only use your albuterol as a rescue medication  Make sure you check your oxygen saturation  at your highest level of activity  to be sure it stays over 90% and adjust  02 flow upward to maintain this level if needed but remember to turn it back to previous settings when you stop (to conserve your supply).  Please schedule a follow up office visit in 2 weeks, call sooner if needed with all medications /inhalers/ solutions in hand so we can verify exactly what you are taking. This includes all medications from all doctors and [place] the [place] separate them into two bags:  the ones you take automatically, no matter what, vs the ones you take just when you feel you need them "BAG #2 is UP TO YOU"  - this will really help [place] help you take your medications more effectively.    07/02/2020  f/u ov/[place] office/Nemiah Kissner re: sob > cough p covid 19  Chief Complaint  Patient presents with   Pulmonary Consult    Breathing is unchanged. She has not had to use her albuterol inhaler or neb.   Dyspnea: housework [place] and back pain both limit her, when gets in rush sob  Cough: better p prednisone and has not recurred on aruity daily  Sleeping: on side/ 3 pillows under head bed is flat  SABA JME:QAST  02:  2lpm 24/7  Rec 02 should be 2lpm at bedtime and during the day ok to leave off as long as saturations over 90%  GERD diet reviewed, bed blocks rec  Arnuity is ok to use as long as cough not a problem - if coughing bothersome, stop arnuity Only use your albuterol as a rescue medication  Please remember to go to the  x-ray department > did not go      PFT's  09/17/20  FEV1 2.25 (102 % ) ratio 0.85 p 0 % improvement from saba p anoro prior to study with DLCO  11.63 (52%) corrects to 3.20 (78%)  for alv volume and FV curve flattened top of ext curve  erv not done,  needs cxr to correlate lung vol low      01/29/2021  f/u ov/[place] office/Dinna Severs re: sob > cough ? Etiology  maint on ***  No chief complaint on file.   Dyspnea:  *** Cough: *** Sleeping: *** SABA use: *** 02: *** Covid status: *** Lung cancer screening: ***   No obvious day to day or daytime variability or assoc excess/ purulent sputum or mucus plugs or hemoptysis or cp or chest tightness, subjective wheeze or overt sinus or hb symptoms.   *** without nocturnal  or early am exacerbation  of respiratory  c/o's or need  for noct saba. Also denies any obvious fluctuation of symptoms with weather or environmental changes or other aggravating or alleviating factors except as outlined above   No unusual exposure hx or h/o childhood pna/ asthma or knowledge of premature birth.  Current Allergies, Complete Past Medical History, Past Surgical History, Family History, and Social History were reviewed in [employer] record.  ROS  The following are not active complaints unless bolded Hoarseness, sore throat, dysphagia, dental problems, itching, sneezing,  nasal congestion or discharge of excess mucus or purulent secretions, ear ache,   fever, chills, sweats, unintended wt loss or wt gain, classically pleuritic or exertional cp,  orthopnea pnd or arm/hand swelling  or leg swelling, presyncope, palpitations, abdominal pain, anorexia, nausea, vomiting, diarrhea  or change in bowel habits or change in bladder habits, change in stools or change in urine, dysuria, hematuria,  rash, arthralgias, visual complaints, headache, numbness, weakness or ataxia or problems with walking or coordination,  change in mood or  memory.        No outpatient medications have been  marked as taking for the 01/29/21 encounter (Appointment) with Tanda Rockers, MD.                  Past Medical History:  Diagnosis Date   Anemia    Arthritis    Asthma    COPD (chronic obstructive pulmonary disease) ([place])    Deep vein thrombosis (DVT) of both lower extremities ([place]) 06/27/2015   Diabetes mellitus    Fibromyalgia    GERD (gastroesophageal reflux disease)    H/O hiatal hernia    Hypercholesteremia    Hypertension    Hyperthyroidism    IBS (irritable bowel syndrome)    Inner ear disease    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   PONV (postoperative nausea and vomiting)    Tachycardia        Objective:    01/29/2021      ***  07/02/2020       228   06/25/20 226 lb (102.5 kg)  06/18/20 223 lb (101.2 kg)  03/18/20 222 lb 14.2 oz (101.1 kg)      Vital signs reviewed  01/29/2021  - Note at rest 02 sats  ***% on ***   General appearance:    ***      cxr ordered 07/02/2020 > not done         Assessment

## 2021-01-30 ENCOUNTER — Ambulatory Visit (HOSPITAL_COMMUNITY)
Admission: RE | Admit: 2021-01-30 | Discharge: 2021-01-30 | Disposition: A | Discharge status: Home / Self Care | Payer: Medicare Other | Source: Ambulatory Visit | Attending: Internal Medicine | Admitting: Internal Medicine

## 2021-01-30 ENCOUNTER — Other Ambulatory Visit: Payer: Self-pay

## 2021-01-30 ENCOUNTER — Ambulatory Visit (HOSPITAL_COMMUNITY)
Admission: RE | Admit: 2021-01-30 | Discharge: 2021-01-30 | Disposition: A | Discharge status: Home / Self Care | Payer: Medicare Other | Source: Ambulatory Visit | Attending: Physician Assistant | Admitting: Physician Assistant

## 2021-01-30 ENCOUNTER — Encounter: Payer: Self-pay | Admitting: Internal Medicine

## 2021-01-30 ENCOUNTER — Ambulatory Visit (INDEPENDENT_AMBULATORY_CARE_PROVIDER_SITE_OTHER): Payer: Medicare Other | Admitting: Internal Medicine

## 2021-01-30 ENCOUNTER — Inpatient Hospital Stay (HOSPITAL_COMMUNITY): Payer: Medicare Other | Attending: Hematology

## 2021-01-30 DIAGNOSIS — R0609 Other forms of dyspnea: Secondary | ICD-10-CM | POA: Diagnosis not present

## 2021-01-30 DIAGNOSIS — D472 Monoclonal gammopathy: Secondary | ICD-10-CM

## 2021-01-30 DIAGNOSIS — D509 Iron deficiency anemia, unspecified: Secondary | ICD-10-CM | POA: Diagnosis not present

## 2021-01-30 LAB — COMPREHENSIVE METABOLIC PANEL
ALT: 43 U/L (ref 0–44)
AST: 50 U/L — ABNORMAL HIGH (ref 15–41)
Albumin: 2.9 g/dL — ABNORMAL LOW (ref 3.5–5.0)
Alkaline Phosphatase: 414 U/L — ABNORMAL HIGH (ref 38–126)
Anion gap: 7 (ref 5–15)
BUN: 19 mg/dL (ref 8–23)
CO2: 24 mmol/L (ref 22–32)
Calcium: 8.7 mg/dL — ABNORMAL LOW (ref 8.9–10.3)
Chloride: 101 mmol/L (ref 98–111)
Creatinine, Ser: 1.09 mg/dL — ABNORMAL HIGH (ref 0.44–1.00)
GFR, Estimated: 55 mL/min — ABNORMAL LOW (ref >60)
Glucose, Bld: 289 mg/dL — ABNORMAL HIGH (ref 70–99)
Potassium: 4.1 mmol/L (ref 3.5–5.1)
Sodium: 132 mmol/L — ABNORMAL LOW (ref 135–145)
Total Bilirubin: 0.6 mg/dL (ref 0.3–1.2)
Total Protein: 8.2 g/dL — ABNORMAL HIGH (ref 6.5–8.1)

## 2021-01-30 LAB — CBC WITH DIFFERENTIAL/PLATELET
Abs Immature Granulocytes: 0.06 10*3/uL (ref 0.00–0.07)
Basophils Absolute: 0.1 10*3/uL (ref 0.0–0.1)
Basophils Relative: 0 %
Eosinophils Absolute: 0.4 10*3/uL (ref 0.0–0.5)
Eosinophils Relative: 3 %
HCT: 40.6 % (ref 36.0–46.0)
Hemoglobin: 12.9 g/dL (ref 12.0–15.0)
Immature Granulocytes: 1 %
Lymphocytes Relative: 21 %
Lymphs Abs: 2.3 10*3/uL (ref 0.7–4.0)
MCH: 27.9 pg (ref 26.0–34.0)
MCHC: 31.8 g/dL (ref 30.0–36.0)
MCV: 87.7 fL (ref 80.0–100.0)
Monocytes Absolute: 0.7 10*3/uL (ref 0.1–1.0)
Monocytes Relative: 6 %
Neutro Abs: 7.8 10*3/uL — ABNORMAL HIGH (ref 1.7–7.7)
Neutrophils Relative %: 69 %
Platelets: 239 10*3/uL (ref 150–400)
RBC: 4.63 MIL/uL (ref 3.87–5.11)
RDW: 13.8 % (ref 11.5–15.5)
WBC: 11.3 10*3/uL — ABNORMAL HIGH (ref 4.0–10.5)
nRBC: 0 % (ref 0.0–0.2)

## 2021-01-30 NOTE — Patient Instructions (Addendum)
Continue Pantoprazole (protonix) 40 mg   Take  30-60 min before first meal of the day and tagamet 400  mg after supper until return to office - this is the best way to tell whether stomach acid is contributing to your problem.    Try off the anoro to see if it changes your breathing or your need for albuterol  Ok to try albuterol 15 min before an activity (on alternating days with inhaler one day, the nebulizer one day and nothing the next day )  that you know would usually make you short of breath and see if it makes any difference and if makes none then don't take albuterol after activity unless you can't catch your breath as this means it's the resting that helps, not the albuterol.   Please remember to go to the lab department at [facility]  for your tests - we will call you with the results when they are available.     Please remember to go to the  x-ray department  @  [facility] for your tests - we will call you with the results when they are available     Please schedule a follow up office visit in 4-6 weeks, sooner if needed         -6

## 2021-01-30 NOTE — Assessment & Plan Note (Addendum)
Echo 2/95/18  GI  diastolic dysfunction only  -  06/18/2020   Walked    approx   100 ft  @ slow pace  stopped due to  Sob with sats 99% on 2lpm    -  Repeat ech 06/28/20  Low nl EF, diastolic dysfunction, mild RV dysfunction  - PFT's  09/17/20  FEV1 2.25 (102 % ) ratio 0.85 p 0 % improvement from saba p anoro prior to study with DLCO  11.63 (52%) corrects to 3.20 (78%)  for alv volume and FV curve flattened top of ext curve   - 01/30/2021   Walked on RA  x  One   lap(s) =  approx 250  ft  @ slow pace, stopped due to leg pain with lowest 02 sats 94%  - 01/30/2021  rec d dimer, bnp no done   Unable to reproduce any desats or significant [place] in office today and 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 Obesity so I doubt inhalers are really helping her and instead rec try off anoro (may actually be contributing to vcd) and max rx directed at gerd and return in 4-6 weeks with all meds in hand using a trust but verify approach to confirm accurate Medication  Reconciliation The principal here is that until we are certain that the  patients are doing what we've asked, it makes no sense to ask them to do more.   Each maintenance medication was reviewed in detail including emphasizing most importantly the difference between maintenance and prns and under what circumstances the prns are to be triggered using an action plan format where appropriate.  Total time for H and P, chart review, counseling,  directly observing portions of ambulatory 02 saturation study/ and generating customized AVS unique to this office visit / same day charting > 30 min

## 2021-01-30 NOTE — Progress Notes (Signed)
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 Repeat CXR in 6 weeks to assure complete resolution of infiltrates.     Discharge Diagnoses:  Principal Problem:   Acute respiratory disease due to COVID-19 virus Active Problems:   Hypertension   COPD (chronic obstructive pulmonary disease) (HCC)   MGUS (monoclonal gammopathy of unknown significance)   Pneumonia due to COVID-19 virus   COVID-19   Chronic diastolic CHF (congestive heart failure) ([place])     Discharge Condition: Stable and improved.  Discharged home with instruction to follow-up with PCP in 2 weeks.   CODE STATUS: Full code   Diet recommendation: Heart healthy modified to 100 diet.          Filed Weights    03/17/20 1902 03/18/20 0430  Weight: 103 kg 101.1 kg      History of present illness:  Stephanie [name] is a 70 y.o. female with medical history significant of Covid pneumonia just discharged today to home from the hospital after completing remdesivir on steroids.  Patient normally is on 2 L of oxygen and was discharged on 6 L with exertion but at rest was only requiring 4 L.  She was transferred by ambulance home by the time she got home she was extremely short of breath and hypoxic.  Patient was readmitted for acute hypoxemic respiratory failure in the setting of Covid pneumonia.  Pulmonology following.   Hospital Course:   Acute hypoxemic respiratory failure secondary to COVID-19 pneumonia -Continue steroids and wean off O2 supplementation as tolerated.    -currently using 2L at rest and 3.5-4L on exertion -continue slow steroids tapering. -Procalcitonin low, not needing antibiotics currently -Continue breathing treatments as needed -Appreciate pulmonology consultation and rec's   Acute COPD exacerbation related to above -IV steroids and breathing treatments as noted above -no wheezing on exam.   Hypokalemia -Repleted -safe to resume diuretics and daily supplementation. -follow electrolytes trend   Type 2 diabetes with hyperglycemia-improved -Resume home adjusted hypoglycemic regimen -advise to follow low carb diet. -Recent A1c of 9.2% -expecting improvement in her CBG's while tapering off steroids.    CKD stage IIIa with AKI -Improved and back to baseline at discharge -Safe to resume the use of metolazone and Demadex -Continue to maintain adequate hydration -Repeat basic metabolic panel at follow-up visit to assess electrolytes and renal function and stability.   Hypertension -stable -follow heart healthy diet. -Resume home antihypertensive agents.   Hypothyroidism -Continue Synthroid   Class 1 Obesity -low calorie diet, portion control and increase physical activity recommended.  -Body mass index is 33.89 kg/m.   Chronic diastolic heart failure -Stable and compensated -Continue to follow daily weights, low-sodium diet and resume home diuretic regimen.       History of Present Illness  06/18/2020  Pulmonary/ 1st office eval/ Nozomi Mettler / [facility]  Chief Complaint  Patient presents with   Pulmonary  Consult    Referred by Dr Manuella Ghazi.  Pt had covid 06 Mar 2020- admitted to [facility] and sent home with supplemental o2. She gets winded with exertion such as cooking and cleaning her home. She uses her albuterol inhaler 2 x daily on average.   Dyspnea:  Room to room at home / scooter for  shopping due to back > breathing on 2lpm  Cough: worse since covid / prednisone rx per PCP x 4 more days Sleep: flat bed with 3 pillows worse cough  SABA use: twice daily as above   rec Stop metaprolol  Bisoprolol 5 mg twice daily - call med to fine tune it.  Change pantoprazole 40 mg Take 30- 60 min before your first and last meals of the day  GERD Only use your albuterol as a rescue medication  Make sure you check your oxygen saturation  at your highest level of activity  to be sure it stays over 90% and adjust  02 flow upward to maintain this level if needed but remember to turn it back to previous settings when you stop (to conserve your supply).  Please schedule a follow up office visit in 2 weeks, call sooner if needed with all medications /inhalers/ solutions in hand so we can verify exactly what you are taking. This includes all medications from all doctors and over the [place] separate them into two bags:  the ones you take automatically, no matter what, vs the ones you take just when you feel you need them "BAG #2 is UP TO YOU"  - this will really help [place] help you take your medications more effectively.    07/02/2020  f/u ov/[place] office/[facility] [place] re: sob > cough p covid 19  Chief Complaint  Patient presents with   Pulmonary Consult    Breathing is unchanged. She has not had to use her albuterol inhaler or neb.   Dyspnea: housework [place] and back pain both limit her, when gets in rush sob  Cough: better p prednisone and has not recurred on aruity daily  Sleeping: on side/ 3 pillows under head bed is flat  SABA MVH:QION  02:  2lpm 24/7  Rec 02 should be 2lpm at bedtime and during the day ok to leave off as long as saturations over 90%  GERD diet reviewed, bed blocks rec  Arnuity is ok to use as long as cough not a problem - if coughing bothersome, stop arnuity Only use your albuterol as a rescue medication  Please remember to go to the  x-ray department > did not go     PFT's  09/17/20  FEV1 2.25 (102 % ) ratio 0.85 p 0 % improvement from saba (off anoro) prior to study with DLCO  11.63 (52%) corrects to 3.20 (78%)  for alv volume and FV curve flattened top of ext curve  erv not done,  needs cxr to correlate lung vol low    Worse since Dec 07 2020   01/30/2021  f/u ov/[place] office/[facility] re: sob > cough ? Etiology  maint on anoro hs again  Chief Complaint  Patient presents with   Follow-up    Patient states she felt she was improving with her SOB until she received flu shot in Oct. 2022.   Uses 2LO2 cont. At night and prn during day, sometimes titrates to 3LO2 cont. With activity.   Dyspnea:  100 ft due to back and breathing  give out about the same time  Cough: 3 x  day and dry  Sleeping: on side/ bed is raised on bricks SABA use: twice a day saba hfa/ neb once a day  02: 02 2lpm hs/ daytime prn  Covid status: vax x 2    No obvious day to day or daytime variability or assoc excess/ purulent sputum or mucus plugs or hemoptysis or cp or chest tightness, subjective wheeze or overt sinus or hb symptoms.   Sleeping as above  without nocturnal  or early am exacerbation  of respiratory  c/o's or need for noct saba.  Also denies any obvious fluctuation of symptoms with weather or environmental changes or other aggravating or alleviating factors except as outlined above   No unusual exposure hx or h/o childhood pna/ asthma or knowledge of premature birth.  Current Allergies, Complete Past Medical History, Past Surgical History, Family History, and Social History were reviewed in [employer] record.  ROS  The following are not active complaints unless bolded Hoarseness, sore throat, dysphagia, dental problems, itching, sneezing,  nasal congestion or discharge of excess mucus or purulent secretions, ear ache,   fever, chills, sweats, unintended wt loss or wt gain, classically pleuritic or exertional cp,  orthopnea pnd or arm/hand  swelling  or leg swelling, presyncope, palpitations, abdominal pain, anorexia, nausea, vomiting, diarrhea  or change in bowel habits or change in bladder habits, change in stools or change in urine, dysuria, hematuria,  rash, arthralgias, visual complaints, headache, numbness, weakness or ataxia or problems with walking/uses cane or coordination,  change in mood or  memory.        Current Meds - - NOTE:   Unable to verify as accurately reflecting what pt takes    Medication Sig   Accu-Chek FastClix Lancets MISC Apply topically.   ACCU-CHEK GUIDE test strip 4 (four) times daily.   acetaminophen (TYLENOL) 500 MG tablet Take 1,000 mg by mouth every 6 (six) hours as needed for moderate pain or headache.   albuterol (PROVENTIL) (2.5 MG/3ML) 0.083% nebulizer solution Take 2.5 mg by nebulization every 6 (six) hours as needed for wheezing or shortness of breath.   Alcohol Swabs (ALCOHOL PADS) 70 % PADS SMARTSIG:Pledget(s) Topical 4 Times Daily   apixaban (ELIQUIS) 5 MG TABS tablet Take 10 mg by mouth BID through Friday 5/19 evening. Take 16m by mouth BID starting 5/20 AM. (Patient taking differently: Take 5 mg by mouth 2 (two) times daily.)   ARNUITY ELLIPTA 100 MCG/ACT AEPB Inhale 1 puff into the lungs at bedtime.    Ascorbic Acid (VITAMIN C) 1000 MG tablet Take 1,000 mg by mouth daily.    bisoprolol (ZEBETA) 10 MG tablet Take 1 tablet (10 mg total) by mouth in the morning and at bedtime.   Blood Glucose Calibration (ACCU-CHEK GUIDE CONTROL) LIQD See admin instructions.   Blood Glucose Monitoring Suppl (ACCU-CHEK GUIDE ME) w/Device KIT 4 (four) times daily.   cetirizine (ZYRTEC) 10 MG tablet Take 10 mg by mouth daily.   Cholecalciferol (VITAMIN D) 2000 units tablet Take 4,000 Units by mouth daily.    cimetidine (TAGAMET) 200 MG tablet Take 0.5 tablets (100 mg total) by mouth daily as needed.   CINNAMON PO Take 2 capsules by mouth 3 (three) times daily.   clobetasol ointment (TEMOVATE) 05.46% Apply 1  application topically 3 (three) times daily as needed (imflammation).   DULoxetine (CYMBALTA) 30 MG capsule Take 1 capsule (30 mg total) by mouth 2 (two) times daily.   fluticasone (VERAMYST) 27.5 MCG/SPRAY nasal spray Place 2  sprays into the nose daily.   guaiFENesin (ROBITUSSIN) 100 MG/5ML SOLN Take 5 mLs (100 mg total) by mouth every 4 (four) hours as needed for cough or to loosen phlegm.   HUMALOG KWIKPEN 100 UNIT/ML KiwkPen Inject 1-50 Units into the skin See admin instructions. Inject 50 units with breakfast, 30 units with lunch, and 50 units with dinner. Inject 1-10 units with snacks   hydrOXYzine (ATARAX/VISTARIL) 25 MG tablet Take 25 mg by mouth every 8 (eight) hours as needed for itching.    Lancet Devices (EASY MINI EJECT LANCING DEVICE) MISC 4 (four) times daily.   levothyroxine (SYNTHROID) 88 MCG tablet Take 88 mcg by mouth daily before breakfast.   lubiprostone (AMITIZA) 8 MCG capsule TAKE 1 CAPSULE(8 MCG) BY MOUTH TWICE DAILY AS NEEDED FOR CONSTIPATION   lubiprostone (AMITIZA) 8 MCG capsule Take 1 capsule (8 mcg total) by mouth 2 (two) times daily with a meal.   LYRICA 75 MG capsule Take 75 mg by mouth 2 (two) times daily.    methocarbamol (ROBAXIN) 500 MG tablet Take 1 tablet (500 mg total) by mouth 3 (three) times daily as needed.   pantoprazole (PROTONIX) 40 MG tablet Take 1 tablet (40 mg total) by mouth daily.   potassium chloride (KLOR-CON) 10 MEQ tablet Take 10 mEq by mouth daily.   pravastatin (PRAVACHOL) 80 MG tablet Take 1 tablet (80 mg total) by mouth at bedtime.   RESTASIS 0.05 % ophthalmic emulsion Place 1 drop into both eyes 2 (two) times daily.   torsemide (DEMADEX) 20 MG tablet Take 2 tablets (40 mg total) by mouth daily. (Patient taking differently: Take 10-20 mg by mouth daily as needed (swelling).)   traMADol (ULTRAM) 50 MG tablet Take 50 mg by mouth 3 (three) times daily as needed for moderate pain.   traZODone (DESYREL) 50 MG tablet Take 50-100 mg by mouth at  bedtime as needed for sleep.   vitamin B-12 (CYANOCOBALAMIN) 1000 MCG tablet Take 1,000 mcg by mouth daily.   XULTOPHY 100-3.6 UNIT-MG/ML SOPN Inject 85 Units as directed at bedtime.                  Past Medical History:  Diagnosis Date   Anemia    Arthritis    Asthma    COPD (chronic obstructive pulmonary disease) (HCC)    Deep vein thrombosis (DVT) of both lower extremities ([place]) 06/27/2015   Diabetes mellitus    Fibromyalgia    GERD (gastroesophageal reflux disease)    H/O hiatal hernia    Hypercholesteremia    Hypertension    Hyperthyroidism    IBS (irritable bowel syndrome)    Inner ear disease    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   PONV (postoperative nausea and vomiting)    Tachycardia        Objective:    01/30/2021     228  07/02/2020       228   06/25/20 226 lb (102.5 kg)  06/18/20 223 lb (101.2 kg)  03/18/20 222 lb 14.2 oz (101.1 kg)      Vital signs reviewed  01/30/2021  - Note at rest 02 sats  100 % on RA   General appearance:    obese bf walks with cane   HEENT : pt wearing mask not removed for exam due to covid -19 concerns.    NECK :  without JVD/Nodes/TM/ nl carotid upstrokes bilaterally   LUNGS: no acc muscle use,  Nl contour chest which is clear  to A and P bilaterally without cough on insp or exp maneuvers   CV:  RRR  no s3 or murmur or increase in P2, and no edema   ABD:  obese soft and nontender with limited inspiratory excursion    MS:  Nl gait/ ext warm without deformities, calf tenderness, cyanosis or clubbing No obvious joint restrictions   SKIN: warm and dry without lesions    NEURO:  alert, approp, nl sensorium with  no motor or cerebellar deficits apparent.     CXR PA and Lateral:   01/30/2021 :    I personally reviewed images and impression is as follows:     Mild cm/ no acute findings       Assessment

## 2021-01-31 LAB — BETA 2 MICROGLOBULIN, SERUM: Beta-2 Microglobulin: 2.9 mg/L — ABNORMAL HIGH (ref 0.6–2.4)

## 2021-01-31 LAB — KAPPA/LAMBDA LIGHT CHAINS
Kappa free light chain: 1117.9 mg/L — ABNORMAL HIGH (ref 3.3–19.4)
Kappa, lambda light chain ratio: 56.18 — ABNORMAL HIGH (ref 0.26–1.65)
Lambda free light chains: 19.9 mg/L (ref 5.7–26.3)

## 2021-02-04 LAB — MULTIPLE MYELOMA PANEL, SERUM
Albumin SerPl Elph-Mcnc: 2.7 g/dL — ABNORMAL LOW (ref 2.9–4.4)
Albumin/Glob SerPl: 0.6 — ABNORMAL LOW (ref 0.7–1.7)
Alpha 1: 0.3 g/dL (ref 0.0–0.4)
Alpha2 Glob SerPl Elph-Mcnc: 1.1 g/dL — ABNORMAL HIGH (ref 0.4–1.0)
B-Globulin SerPl Elph-Mcnc: 1.3 g/dL (ref 0.7–1.3)
Gamma Glob SerPl Elph-Mcnc: 2 g/dL — ABNORMAL HIGH (ref 0.4–1.8)
Globulin, Total: 4.6 g/dL — ABNORMAL HIGH (ref 2.2–3.9)
IgA: 200 mg/dL (ref 87–352)
IgG (Immunoglobin G), Serum: 2458 mg/dL — ABNORMAL HIGH (ref 586–1602)
IgM (Immunoglobulin M), Srm: 72 mg/dL (ref 26–217)
M Protein SerPl Elph-Mcnc: 1.5 g/dL — ABNORMAL HIGH
Total Protein ELP: 7.3 g/dL (ref 6.0–8.5)

## 2021-02-04 NOTE — Progress Notes (Signed)
56.18.  [place], [place] [id number]   CLINIC:  Medical Oncology/Hematology  PCP:  [name], [place] [address] [phone]   REASON FOR VISIT:  Follow-up for MGUS  PRIOR THERAPY: None  CURRENT THERAPY: Surveillance  INTERVAL HISTORY:  [name] 70 y.o. female returns for routine follow-up of her MGUS.  She was last seen by Dede Query, PA-C on 09/02/2020.  At today's visit, she reports feeling fair.  No recent hospitalizations, surgeries, or changes in baseline health status.  She denies any new bone pain or recent fractures, but continues to have chronic arthritic pain in her back and knees.  She has tinnitus and peripheral neuropathy that have been worsening lately. She denies any B symptoms such as fever, chills, night sweats, unintentional weight loss.  She has not noticed any new lumps or bumps.  She continues to take Eliquis for her history of unprovoked DVT. She has not noticed any major bleeding events such as hematemesis, hematochezia, melena, or epistaxis.  No current signs or symptoms of recurrent DVT or PE such as unilateral leg swelling, pleuritic chest pain, or unexplained cough.  She has very little energy and 80% appetite. She endorses that she is maintaining a stable weight.    REVIEW OF SYSTEMS:  Review of Systems  Constitutional:  Positive for fatigue. Negative for appetite change, chills, diaphoresis, fever and unexpected weight change.  HENT:   Positive for trouble swallowing. Negative for lump/mass and nosebleeds.   Eyes:  Negative for eye problems.  Respiratory:  Positive for shortness of breath (ongoing since she had COVID in January 2022). Negative for cough and hemoptysis.   Cardiovascular:  Positive for chest pain (atypical chest pain, sharp/stabbing pain that occurs at rest and with exertion, last < 5 seconds, non-pleuritic) and palpitations. Negative for leg swelling.   Gastrointestinal:  Positive for constipation. Negative for abdominal pain, blood in stool, diarrhea, nausea and vomiting.  Genitourinary:  Negative for hematuria.   Skin: Negative.   Neurological:  Positive for light-headedness and numbness. Negative for dizziness and headaches.  Hematological:  Does not bruise/bleed easily.  Psychiatric/Behavioral:  Positive for sleep disturbance.      PAST MEDICAL/SURGICAL HISTORY:  Past Medical History:  Diagnosis Date   Anemia    Arthritis    Asthma    COPD (chronic obstructive pulmonary disease) ([place])    COVID-19    Deep vein thrombosis (DVT) of both lower extremities ([place]) 06/27/2015   Fibromyalgia    GERD (gastroesophageal reflux disease)    H/O hiatal hernia    Hypercholesteremia    Hypertension    Hyperthyroidism    IBS (irritable bowel syndrome)    Inappropriate sinus tachycardia    Inner ear disease    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   Type 2 diabetes mellitus ([place])    Past Surgical History:  Procedure Laterality Date   ABDOMINAL HYSTERECTOMY  partial   CARPAL TUNNEL RELEASE Right 1991   CATARACT EXTRACTION W/PHACO Right 05/08/2013   Procedure: CATARACT EXTRACTION PHACO AND INTRAOCULAR LENS PLACEMENT ([place]);  Surgeon: Tonny Branch, MD;  Location: [facility];  Service: Ophthalmology;  Laterality: Right;  CDE 10.31   CATARACT EXTRACTION W/PHACO Left 08/17/2013   Procedure: CATARACT EXTRACTION PHACO AND INTRAOCULAR LENS PLACEMENT ([facility]);  Surgeon: Tonny Branch, MD;  Location: [facility];  Service: Ophthalmology;  Laterality: Left;  CDE:9.03   CHOLECYSTECTOMY  1971   COLONOSCOPY WITH PROPOFOL N/A  01/06/2016   Dr. Gala Romney: diverticulosis    DENTAL SURGERY     ESOPHAGEAL BRUSHING  08/29/2019   Procedure: ESOPHAGEAL BRUSHING;  Surgeon: Daneil Dolin, MD;  Location: [facility];  Service: Endoscopy;;   ESOPHAGOGASTRODUODENOSCOPY (EGD) WITH PROPOFOL N/A 01/06/2016   Dr. Gala Romney: normal s/p empiric dilation     ESOPHAGOGASTRODUODENOSCOPY (EGD) WITH PROPOFOL N/A 08/29/2019   esophageal plaques vs medication residue adherent to tubular esophagus s/p KOH brushing and dilation. Medium-sized hiatal hernia. + for candida. Diflucan.    EYE SURGERY     MALONEY DILATION N/A 01/06/2016   Procedure: Venia Minks DILATION;  Surgeon: Daneil Dolin, MD;  Location: [facility];  Service: Endoscopy;  Laterality: N/A;   MALONEY DILATION N/A 08/29/2019   Procedure: Venia Minks DILATION;  Surgeon: Daneil Dolin, MD;  Location[facility];  Service: Endoscopy;  Laterality: N/A;   REVERSE SHOULDER ARTHROPLASTY Right 08/06/2017   Procedure: RIGHT REVERSE SHOULDER ARTHROPLASTY;  Surgeon: Netta Cedars, MD;  Location: [facility];  Service: Orthopedics;  Laterality: Right;   WRIST GANGLION EXCISION Left      SOCIAL HISTORY:  Social History   Socioeconomic History   Marital status: Married    Spouse name: Not on file   Number of children: Not on file   Years of education: Not on file   Highest education level: Not on file  Occupational History   Not on file  Tobacco Use   Smoking status: Former    Packs/day: 0.25    Years: 30.00    Pack years: 7.50    Types: Cigarettes    Quit date: 02/17/2011    Years since quitting: 9.9   Smokeless tobacco: Never  Vaping Use   Vaping Use: Never used  Substance and Sexual Activity   Alcohol use: Not Currently    Comment: rare   Drug use: No   Sexual activity: Not Currently    Birth control/protection: Surgical  Other Topics Concern   Not on file  Social History Narrative   Not on file   Social Determinants of Health   Financial Resource Strain: Not on file  Food Insecurity: Not on file  Transportation Needs: Not on file  Physical Activity: Not on file  Stress: Not on file  Social Connections: Not on file  Intimate Partner Violence: Not on file    FAMILY HISTORY:  Family History  Problem Relation Age of Onset   Hypertension Mother    Diabetes Mother    COPD Mother     Arthritis Mother    Diabetes Father    Arthritis Father    Dementia Father    CAD Father    Hypothyroidism Sister    Diabetes Brother    Colon cancer Niece    Colon polyps Neg Hx     CURRENT MEDICATIONS:  Outpatient Encounter Medications as of 02/05/2021  Medication Sig Note   Accu-Chek FastClix Lancets MISC Apply topically.    ACCU-CHEK GUIDE test strip 4 (four) times daily.    acetaminophen (TYLENOL) 500 MG tablet Take 1,000 mg by mouth every 6 (six) hours as needed for moderate pain or headache.    albuterol (PROVENTIL) (2.5 MG/3ML) 0.083% nebulizer solution Take 2.5 mg by nebulization every 6 (six) hours as needed for wheezing or shortness of breath.    Alcohol Swabs (ALCOHOL PADS) 70 % PADS SMARTSIG:Pledget(s) Topical 4 Times Daily    apixaban (ELIQUIS) 5 MG TABS tablet Take 10 mg by mouth BID through Friday 5/19 evening. Take  74m by mouth BID starting 5/20 AM. (Patient taking differently: Take 5 mg by mouth 2 (two) times daily.)    ARNUITY ELLIPTA 100 MCG/ACT AEPB Inhale 1 puff into the lungs at bedtime.     Ascorbic Acid (VITAMIN C) 1000 MG tablet Take 1,000 mg by mouth daily.     bisoprolol (ZEBETA) 10 MG tablet Take 1 tablet (10 mg total) by mouth in the morning and at bedtime.    Blood Glucose Calibration (ACCU-CHEK GUIDE CONTROL) LIQD See admin instructions.    Blood Glucose Monitoring Suppl (ACCU-CHEK GUIDE ME) w/Device KIT 4 (four) times daily.    cetirizine (ZYRTEC) 10 MG tablet Take 10 mg by mouth daily.    Cholecalciferol (VITAMIN D) 2000 units tablet Take 4,000 Units by mouth daily.     cimetidine (TAGAMET) 200 MG tablet Take 0.5 tablets (100 mg total) by mouth daily as needed.    CINNAMON PO Take 2 capsules by mouth 3 (three) times daily.    clobetasol ointment (TEMOVATE) 07.78% Apply 1 application topically 3 (three) times daily as needed (imflammation).    DULoxetine (CYMBALTA) 30 MG capsule Take 1 capsule (30 mg total) by mouth 2 (two) times daily.     fluticasone (VERAMYST) 27.5 MCG/SPRAY nasal spray Place 2 sprays into the nose daily.    guaiFENesin (ROBITUSSIN) 100 MG/5ML SOLN Take 5 mLs (100 mg total) by mouth every 4 (four) hours as needed for cough or to loosen phlegm.    HUMALOG KWIKPEN 100 UNIT/ML KiwkPen Inject 1-50 Units into the skin See admin instructions. Inject 50 units with breakfast, 30 units with lunch, and 50 units with dinner. Inject 1-10 units with snacks    hydrOXYzine (ATARAX/VISTARIL) 25 MG tablet Take 25 mg by mouth every 8 (eight) hours as needed for itching.     Lancet Devices (EASY MINI EJECT LANCING DEVICE) MISC 4 (four) times daily.    levothyroxine (SYNTHROID) 88 MCG tablet Take 88 mcg by mouth daily before breakfast.    lubiprostone (AMITIZA) 8 MCG capsule TAKE 1 CAPSULE(8 MCG) BY MOUTH TWICE DAILY AS NEEDED FOR CONSTIPATION    lubiprostone (AMITIZA) 8 MCG capsule Take 1 capsule (8 mcg total) by mouth 2 (two) times daily with a meal.    LYRICA 75 MG capsule Take 75 mg by mouth 2 (two) times daily.     methocarbamol (ROBAXIN) 500 MG tablet Take 1 tablet (500 mg total) by mouth 3 (three) times daily as needed.    pantoprazole (PROTONIX) 40 MG tablet Take 1 tablet (40 mg total) by mouth daily.    potassium chloride (KLOR-CON) 10 MEQ tablet Take 10 mEq by mouth daily.    pravastatin (PRAVACHOL) 80 MG tablet Take 1 tablet (80 mg total) by mouth at bedtime.    RESTASIS 0.05 % ophthalmic emulsion Place 1 drop into both eyes 2 (two) times daily.    torsemide (DEMADEX) 20 MG tablet Take 2 tablets (40 mg total) by mouth daily. (Patient taking differently: Take 10-20 mg by mouth daily as needed (swelling).)    traMADol (ULTRAM) 50 MG tablet Take 50 mg by mouth 3 (three) times daily as needed for moderate pain.    traZODone (DESYREL) 50 MG tablet Take 50-100 mg by mouth at bedtime as needed for sleep. 03/19/2020: LF: 01/01/20 DS:90   vitamin B-12 (CYANOCOBALAMIN) 1000 MCG tablet Take 1,000 mcg by mouth daily.    XULTOPHY 100-3.6  UNIT-MG/ML SOPN Inject 85 Units as directed at bedtime.    No facility-administered encounter medications  on file as of 02/05/2021.    ALLERGIES:  Allergies  Allergen Reactions   Tetracyclines & Related Anaphylaxis   Banana Hives and Nausea And Vomiting   Penicillins Rash and Other (See Comments)    Has patient had a PCN reaction causing immediate rash, facial/tongue/throat swelling, SOB or lightheadedness with hypotension: No Has patient had a PCN reaction causing severe rash involving mucus membranes or skin necrosis: No Has patient had a PCN reaction that required hospitalization No Has patient had a PCN reaction occurring within the last 10 years: No If all of the above answers are "NO", then may proceed with Cephalosporin use.      PHYSICAL EXAM:  ECOG PERFORMANCE STATUS: 2 - Symptomatic, <50% confined to bed  There were no vitals filed for this visit. There were no vitals filed for this visit. Physical Exam Constitutional:      Appearance: Normal appearance. She is obese.  HENT:     Head: Normocephalic and atraumatic.     Mouth/Throat:     Mouth: Mucous membranes are moist.  Eyes:     Extraocular Movements: Extraocular movements intact.     Pupils: Pupils are equal, round, and reactive to light.  Cardiovascular:     Rate and Rhythm: Normal rate and regular rhythm.     Pulses: Normal pulses.     Heart sounds: Normal heart sounds.  Pulmonary:     Effort: Pulmonary effort is normal.     Breath sounds: Normal breath sounds.  Abdominal:     General: Bowel sounds are normal.     Palpations: Abdomen is soft.     Tenderness: There is no abdominal tenderness.  Musculoskeletal:        General: No swelling.     Right lower leg: Edema (1+) present.     Left lower leg: Edema (1+) present.  Lymphadenopathy:     Cervical: No cervical adenopathy.  Skin:    General: Skin is warm and dry.  Neurological:     General: No focal deficit present.     Mental Status: She is alert  and oriented to person, place, and time.  Psychiatric:        Mood and Affect: Mood normal.        Behavior: Behavior normal.     LABORATORY DATA:  I have reviewed the labs as listed.  CBC    Component Value Date/Time   WBC 11.3 (H) 01/30/2021 1249   RBC 4.63 01/30/2021 1249   HGB 12.9 01/30/2021 1249   HCT 40.6 01/30/2021 1249   PLT 239 01/30/2021 1249   MCV 87.7 01/30/2021 1249   MCH 27.9 01/30/2021 1249   MCHC 31.8 01/30/2021 1249   RDW 13.8 01/30/2021 1249   LYMPHSABS 2.3 01/30/2021 1249   MONOABS 0.7 01/30/2021 1249   EOSABS 0.4 01/30/2021 1249   BASOSABS 0.1 01/30/2021 1249   CMP Latest Ref Rng & Units 01/30/2021 11/28/2020 09/30/2020  Glucose 70 - 99 mg/dL 289(H) 159(H) 181(H)  BUN 8 - 23 mg/dL 19 15 24(H)  Creatinine 0.44 - 1.00 mg/dL 1.09(H) 1.10(H) 1.20(H)  Sodium 135 - 145 mmol/L 132(L) 136 134(L)  Potassium 3.5 - 5.1 mmol/L 4.1 4.5 3.7  Chloride 98 - 111 mmol/L 101 103 102  CO2 22 - 32 mmol/L _0 Calcium 8.9 - 10.3 mg/dL 8.7(L) 8.7(L) 9.5  Total Protein 6.5 - 8.1 g/dL 8.2(H) - -  Total Bilirubin 0.3 - 1.2 mg/dL 0.6 - -  Alkaline Phos 38 - 126  U/L 414(H) - -  AST 15 - 41 U/L 50(H) - -  ALT 0 - 44 U/L 43 - -    DIAGNOSTIC IMAGING:  I have independently reviewed the relevant imaging and discussed with the patient.  ASSESSMENT & PLAN: 1.  IgG kappa MGUS: - Bone marrow biopsy on 09/03/2015 showing 9% plasma cells and negative skeletal survey. - History of nephrotic range proteinuria, resolved now. - Skeletal survey (01/30/2021): No evidence of lytic or sclerotic lesions identified. - We reviewed her most recent labs (01/30/2021): Elevated kappa free light chain 1117.9, normal lambda 19.9, elevated ratio 56.18.  (This represents a significant increase from her most recent previous level, but she has had previous spikes and free light chains in the past with kappa 936 in March 2021 and free light chain ratio 52.14 in February 2020).  Myeloma panel shows  stable M spike 1.5 but with additional M spike of 0.2 (biclonal IgG kappa).  Mildly elevated beta-2 microglobulin 2.9. - No CRAB features per her last labs (01/30/2021) with Hgb 12.9, creatinine 1.09 (baseline CKD stage IIIa), and calcium 8.7. - No new bone pain of B-symptoms - PLAN: Due to increased kappa free light chain and increased light chain ratio, we will repeat labs and have patient RTC in 3 months.  If any significant worsening of her labs, would consider repeat bone marrow biopsy.  2.  Elevated liver enzymes: - Ultrasound abdomen on 04/25/2018 showed fatty liver. - Most recent CMP (01/30/2021) shows mildly elevated AST 50, with elevated alk phos 414 - PLAN: Continue follow up with GI (NP Roseanne Kaufman)  3.  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 - She is taking Eliquis without any major bleeding events  - PLAN: Continue Eliquis.  4.  Leukocytosis: - Intermittent leukocytosis since at least 2012 - Denies any recent fevers, B symptoms, or infections  - Denies any chronic steroid use.   - She is a non-smoker  - Most recent labs (01/30/2021): WBC 11.3 with ANC 7.8 - PLAN: Suspect reactive leukocytosis in the setting of obesity and high burden of chronic disease. - We will continue to monitor - We will consider additional work-up such as MPN testing if she has any significant increases in WBC.  5.  Microcytic anemia: - Previously known to be iron deficient. - Last received IV iron on 06/25/2017 and 07/16/2017. - Not currently on iron supplements. - CBC (01/30/2021): Hgb 12.9/MCV 87.7 - PLAN: We will check iron levels at next visit.   6.  Arthralgias/myalgias: - Arthralgias/myalgias - unrelated to MGUS.  Skeletal survey negative for lytic lesions.  Follows with orthopedics. - Chest pain - atypical, low suspicion for cardiac chest pain; encouraged to discuss with PCP   PLAN SUMMARY & DISPOSITION: Labs and RTC in 3 months  All  questions were answered. The patient knows to call the clinic with any problems, questions or concerns.  Medical decision making: Moderate  Time spent on visit: I spent 20 minutes counseling the patient face to face. The total time spent in the appointment was 30 minutes and more than 50% was on counseling.   Harriett Rush, PA-C  02/05/21 9:40 PM

## 2021-02-05 ENCOUNTER — Inpatient Hospital Stay (HOSPITAL_BASED_OUTPATIENT_CLINIC_OR_DEPARTMENT_OTHER): Payer: Medicare Other | Admitting: Physician Assistant

## 2021-02-05 VITALS — BP 154/84 | HR 94 | Temp 97.2°F | Resp 20 | Wt 229.4 lb

## 2021-02-05 DIAGNOSIS — D472 Monoclonal gammopathy: Secondary | ICD-10-CM | POA: Diagnosis not present

## 2021-02-05 DIAGNOSIS — D509 Iron deficiency anemia, unspecified: Secondary | ICD-10-CM | POA: Diagnosis not present

## 2021-02-05 NOTE — Patient Instructions (Signed)
[place] at [facility] Discharge Instructions  You were seen today by Tarri Abernethy PA-C for your MGUS (abnormal protein).  As we discussed, your protein levels are slightly higher than they were before, although you do not show any other signs of progression to cancer.  We will check your labs again in 3 months and if your levels continue to be high at that time, we may consider ordering a bone marrow biopsy.  LABS: Return in 3 months for repeat labs  OTHER TESTS: No other tests at this time  MEDICATIONS: No changes to home medications  FOLLOW-UP APPOINTMENT: Office visit in 3 months, after labs   Thank you for choosing [place] at [facility] to provide your oncology and hematology care.  To afford each patient quality time with our provider, please arrive at least 15 minutes before your scheduled appointment time.   If you have a lab appointment with the [facility] please come in thru the Main Entrance and check in at the main information desk.  You need to re-schedule your appointment should you arrive 10 or more minutes late.  We strive to give you quality time with our providers, and arriving late affects you and other patients whose appointments are after yours.  Also, if you no show three or more times for appointments you may be dismissed from the clinic at the providers discretion.     Again, thank you for choosing [facility].  Our hope is that these requests will decrease the amount of time that you wait before being seen by our physicians.       _____________________________________________________________  Should you have questions after your visit to [facility], please contact our office at [phone] and follow the prompts.  Our office hours are 8:00 a.m. and 4:30 p.m. Monday - Friday.  Please note that voicemails left after 4:00 p.m. may not be returned until the following business day.   We are closed weekends and major holidays.  You do have access to a nurse 24-7, just call the main number to the clinic [phone] and do not press any options, hold on the line and a nurse will answer the phone.    For prescription refill requests, have your pharmacy contact our office and allow 72 hours.    Due to Covid, you will need to wear a mask upon entering the hospital. If you do not have a mask, a mask will be given to you at the Main Entrance upon arrival. For doctor visits, patients may have 1 support person age 58 or older with them. For treatment visits, patients can not have anyone with them due to social distancing guidelines and our immunocompromised population.

## 2021-02-06 ENCOUNTER — Ambulatory Visit (HOSPITAL_COMMUNITY): Payer: Medicare Other | Admitting: Physician Assistant

## 2021-03-19 ENCOUNTER — Telehealth: Payer: Self-pay

## 2021-03-19 NOTE — Telephone Encounter (Signed)
The nausea is worrisome for viral infection which we can't test for here so cancel appt and refer to UC

## 2021-03-19 NOTE — Telephone Encounter (Signed)
Patient has had a temp 99.8 today. Cough (dry), congestion and nausea. Covid test on the 10th when symptoms started and it was negative. No flu test done. Patient has an appt tomorrow in [facility] office and wants to know if she should still come in and would also like to know if we can call in some medicine for her today.   Dr. Melvyn Novas please advise

## 2021-03-19 NOTE — Telephone Encounter (Signed)
Called and spoke with patient. She voiced understanding and will call the office after going to UC to update and reschedule appt.

## 2021-03-20 ENCOUNTER — Ambulatory Visit: Payer: Medicare Other | Admitting: Internal Medicine

## 2021-03-20 ENCOUNTER — Ambulatory Visit: Payer: Self-pay

## 2021-03-21 ENCOUNTER — Other Ambulatory Visit: Payer: Self-pay

## 2021-03-21 ENCOUNTER — Ambulatory Visit
Admission: RE | Admit: 2021-03-21 | Discharge: 2021-03-21 | Disposition: A | Discharge status: Home / Self Care | Payer: Medicare Other | Source: Ambulatory Visit | Attending: Family Medicine | Admitting: Family Medicine

## 2021-03-21 ENCOUNTER — Ambulatory Visit (INDEPENDENT_AMBULATORY_CARE_PROVIDER_SITE_OTHER): Payer: Medicare Other

## 2021-03-21 VITALS — BP 162/99 | Temp 97.9°F | Resp 26

## 2021-03-21 DIAGNOSIS — R030 Elevated blood-pressure reading, without diagnosis of hypertension: Secondary | ICD-10-CM

## 2021-03-21 DIAGNOSIS — J441 Chronic obstructive pulmonary disease with (acute) exacerbation: Secondary | ICD-10-CM

## 2021-03-21 DIAGNOSIS — R0682 Tachypnea, not elsewhere classified: Secondary | ICD-10-CM

## 2021-03-21 DIAGNOSIS — R0602 Shortness of breath: Secondary | ICD-10-CM | POA: Diagnosis not present

## 2021-03-21 DIAGNOSIS — J849 Interstitial pulmonary disease, unspecified: Secondary | ICD-10-CM

## 2021-03-21 DIAGNOSIS — R059 Cough, unspecified: Secondary | ICD-10-CM

## 2021-03-21 MED ORDER — LEVOFLOXACIN 500 MG PO TABS
500.0000 mg | ORAL_TABLET | Freq: Every day | ORAL | 0 refills | Status: DC
Start: 2021-03-21 — End: 2021-05-21

## 2021-03-21 MED ORDER — PREDNISONE 20 MG PO TABS: 40.0000 mg | ORAL_TABLET | Freq: Every day | ORAL | 0 refills | Status: DC

## 2021-03-21 MED ORDER — ALBUTEROL SULFATE HFA 108 (90 BASE) MCG/ACT IN AERS: 1.0000 | INHALATION_SPRAY | Freq: Four times a day (QID) | RESPIRATORY_TRACT | 0 refills | Status: DC | PRN

## 2021-03-21 NOTE — Discharge Instructions (Signed)
Go to the emergency department if worsening anytime.

## 2021-03-21 NOTE — ED Triage Notes (Signed)
Pt reports cough shortness of breath, chest and nasal congestion x 3 weeks; fever  102.0 F on and off, worse at night x 3 weeks; sore throat x 3 days. Pt reports she was diagnosed with Bronchopneumonia on 02/25/2021.   Pt used 2 L O2%, states she used 4 L O2 on her way here as she was not abe to catch the air.

## 2021-03-24 ENCOUNTER — Telehealth: Payer: Self-pay | Admitting: Internal Medicine

## 2021-03-24 NOTE — Telephone Encounter (Signed)
Patient had sick call on 03/19/2021 and Dr.  Melvyn Novas rec. That she go to Urgent Care.   Patient called with follow up from Urgent Care:  Pt went to Urgent Care last week.  Has pneumonia.  Gave her antibiotic and 40 mg prednisone and albuterol inhaler. Still has congestion. Cough with green mucus and  soreness on right side of chest.   Routing to Dr. Melvyn Novas as an Juluis Rainier.

## 2021-03-24 NOTE — Telephone Encounter (Signed)
Glad to hear she's feeling better even  if not yet well. As long as trending in right direction, no changes needed

## 2021-03-24 NOTE — Telephone Encounter (Signed)
Called and spoke to patient.She voiced understanding. Nothing further needed.

## 2021-03-25 NOTE — ED Provider Notes (Signed)
[facility]    CSN: [id number] Arrival date & time: 03/21/21  1108      History   Chief Complaint Chief Complaint  Patient presents with   Appointment    1100   Cough   Fever   Sore Throat         HPI Stephanie [name] is a 71 y.o. female.   Presenting today with several week history of progressively worsening shortness of breath, cough, chest congestion, nasal congestion, now with 3 nights of fevers up to 102 F, fatigue.  She was diagnosed with pneumonia on 02/25/2021, completed doxycycline and prednisone at that time and got better for a very brief period of time.  She states that she typically is on 2 L of continuous O2 via nasal cannula, had to increase to 4 L this morning as she could not catch her breath despite the oxygen.  She has a history of CHF, DVT, pneumonia, COPD compliant with inhaler regimen.  Also taking Mucinex with no relief.   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   Type 2 diabetes mellitus ([place])     Patient Active Problem List   Diagnosis Date Noted   NASH (nonalcoholic steatohepatitis) 01/21/2021   Zito (dyspnea on exertion) 06/18/2020   Chronic diastolic CHF (congestive heart failure) ([place])    COVID-19 03/17/2020   Pneumonia due to COVID-19 virus 03/11/2020   Acute respiratory disease due to COVID-19 virus 03/11/2020   Abnormal LFTs 02/28/2018   TIA (transient ischemic attack) 09/15/2017   Localized osteoarthritis of right shoulder 08/08/2017   Dysphagia 12/16/2015   MGUS  (monoclonal gammopathy of unknown significance) 12/13/2015   Constipation 10/27/2015   Diarrhea 08/02/2015   Elevated alkaline phosphatase level 07/23/2015   Heme positive stool 07/23/2015   Hypoglycemia 07/01/2015   Deep vein thrombosis (DVT) of both lower extremities ([place]) 06/27/2015   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   Maculopapular rash, generalized 06/27/2015   Alopecia 06/27/2015   Hypothyroidism 06/27/2015   Diverticulitis 10/19/2013   Tobacco abuse 10/19/2013   Obesity 10/19/2013   Diabetes ([place]) 10/19/2013   Hypertension    Anemia    GERD (gastroesophageal reflux disease)    COPD (chronic obstructive pulmonary disease) ([place])     Past Surgical History:  Procedure Laterality Date   ABDOMINAL HYSTERECTOMY  partial   CARPAL TUNNEL RELEASE Right 1991   CATARACT EXTRACTION W/PHACO Right 05/08/2013   Procedure: CATARACT EXTRACTION PHACO AND INTRAOCULAR LENS PLACEMENT ([place]);  Surgeon: Tonny Branch, MD;  Location: [facility];  Service: Ophthalmology;  Laterality: Right;  CDE 10.31   CATARACT EXTRACTION W/PHACO Left 08/17/2013   Procedure: CATARACT EXTRACTION PHACO AND INTRAOCULAR LENS PLACEMENT (IOC);  Surgeon: Tonny Branch, MD;  Location: [facility];  Service: Ophthalmology;  Laterality: Left;  CDE:9.03   CHOLECYSTECTOMY  1971   COLONOSCOPY WITH PROPOFOL N/A 01/06/2016   Dr. Gala Romney: diverticulosis    DENTAL SURGERY     ESOPHAGEAL BRUSHING  08/29/2019  Procedure: ESOPHAGEAL BRUSHING;  Surgeon: Daneil Dolin, MD;  Location: [facility];  Service: Endoscopy;;   ESOPHAGOGASTRODUODENOSCOPY (EGD) WITH PROPOFOL N/A 01/06/2016   Dr. Gala Romney: normal s/p empiric dilation    ESOPHAGOGASTRODUODENOSCOPY (EGD) WITH PROPOFOL N/A 08/29/2019   esophageal plaques vs medication residue adherent to tubular esophagus s/p KOH brushing and dilation. Medium-sized hiatal hernia. + for candida. Diflucan.    EYE SURGERY     MALONEY DILATION N/A 01/06/2016   Procedure: Venia Minks DILATION;  Surgeon: Daneil Dolin, MD;  Location: [facility];  Service: Endoscopy;  Laterality: N/A;   MALONEY DILATION N/A 08/29/2019   Procedure: Venia Minks DILATION;  Surgeon: Daneil Dolin, MD;  Location: [facility];  Service: Endoscopy;  Laterality: N/A;   REVERSE SHOULDER ARTHROPLASTY Right 08/06/2017   Procedure: RIGHT REVERSE SHOULDER ARTHROPLASTY;  Surgeon: Netta Cedars, MD;  Location: [place];  Service: Orthopedics;  Laterality: Right;   WRIST GANGLION EXCISION Left     OB History   No obstetric history on file.    Home Medications    Prior to Admission medications   Medication Sig Start Date End Date Taking? Authorizing Provider  albuterol (VENTOLIN HFA) 108 (90 Base) MCG/ACT inhaler Inhale 1-2 puffs into the lungs every 6 (six) hours as needed for wheezing or shortness of breath. 03/21/21  Yes Volney American, PA-C  levofloxacin (LEVAQUIN) 500 MG tablet Take 1 tablet (500 mg total) by mouth daily. 03/21/21  Yes Volney American, PA-C  predniSONE (DELTASONE) 20 MG tablet Take 2 tablets (40 mg total) by mouth daily with breakfast. 03/21/21  Yes Volney American, PA-C  Accu-Chek FastClix Lancets MISC Apply topically. 03/15/20   [provider]  ACCU-CHEK GUIDE test strip 4 (four) times daily. 07/02/20   [provider]  acetaminophen (TYLENOL) 500 MG tablet Take 1,000 mg by mouth every 6 (six) hours as needed for moderate pain or headache.    [provider]  albuterol (PROVENTIL) (2.5 MG/3ML) 0.083% nebulizer solution Take 2.5 mg by nebulization every 6 (six) hours as needed for wheezing or shortness of breath.    [provider]  Alcohol Swabs (ALCOHOL PADS) 70 % PADS SMARTSIG:Pledget(s) Topical 4 Times Daily 08/12/20   [provider]  apixaban (ELIQUIS) 5 MG TABS tablet Take 10 mg by mouth BID through Friday 5/19 evening. Take 47m by mouth BID starting 5/20 AM. Patient taking differently: Take 5 mg by mouth 2 (two) times daily. 07/25/15   LLendon Colonel NP  ARNUITY ELLIPTA 100 MCG/ACT AEPB Inhale 1 puff into the lungs at bedtime.  06/13/17   [provider]  Ascorbic Acid (VITAMIN C) 1000 MG tablet Take 1,000 mg by mouth daily.     [provider]  bisoprolol (ZEBETA) 10 MG tablet Take 1 tablet (10 mg total) by mouth in the morning and at bedtime. 01/02/21   Strader, BFransisco Hertz PA-C  Blood Glucose Calibration (ACCU-CHEK GUIDE CONTROL) LIQD See admin instructions. 07/02/20   [provider]  Blood Glucose Monitoring Suppl (ACCU-CHEK GUIDE ME) w/Device KIT 4 (four) times daily. 07/02/20   [provider]  cetirizine (ZYRTEC) 10 MG tablet Take 10 mg by mouth daily.    [provider]  Cholecalciferol (VITAMIN D) 2000 units tablet Take 4,000 Units by mouth daily.     [provider]  cimetidine (TAGAMET) 200 MG tablet Take 0.5 tablets (100 mg total) by mouth daily as needed. 03/22/20   MBarton Dubois MD  CVerneita Griffes  PO Take 2 capsules by mouth 3 (three) times daily.    [provider]  clobetasol ointment (TEMOVATE) 6.78 % Apply 1 application topically 3 (three) times daily as needed (imflammation). 06/20/15   [provider]  DULoxetine (CYMBALTA) 30 MG capsule Take 1 capsule (30 mg total) by mouth 2 (two) times daily. 09/16/17   Kayleen Memos, DO  fluticasone (VERAMYST) 27.5 MCG/SPRAY nasal spray Place 2 sprays into the nose daily.    [provider]  guaiFENesin (ROBITUSSIN) 100 MG/5ML SOLN Take 5 mLs (100 mg total) by mouth every 4 (four) hours as needed for cough or to loosen phlegm. 03/17/20   Manuella Ghazi, Pratik D, DO  HUMALOG KWIKPEN 100 UNIT/ML KiwkPen Inject 1-50 Units into the skin See admin instructions. Inject 50 units with breakfast, 30 units with lunch, and 50 units with dinner. Inject 1-10 units with snacks 09/30/17   [provider]  hydrOXYzine (ATARAX/VISTARIL) 25 MG tablet Take 25 mg by mouth every 8 (eight) hours as needed for itching.  06/20/15    [provider]  Lancet Devices (EASY MINI EJECT LANCING DEVICE) MISC 4 (four) times daily. 07/02/20   [provider]  levothyroxine (SYNTHROID) 88 MCG tablet Take 88 mcg by mouth daily before breakfast.    [provider]  lubiprostone (AMITIZA) 8 MCG capsule TAKE 1 CAPSULE(8 MCG) BY MOUTH TWICE DAILY AS NEEDED FOR CONSTIPATION 10/03/20   Erenest Rasher, PA-C  lubiprostone (AMITIZA) 8 MCG capsule Take 1 capsule (8 mcg total) by mouth 2 (two) times daily with a meal. 01/21/21   Annitta Needs, NP  LYRICA 75 MG capsule Take 75 mg by mouth 2 (two) times daily.  07/18/15   [provider]  methocarbamol (ROBAXIN) 500 MG tablet Take 1 tablet (500 mg total) by mouth 3 (three) times daily as needed. 08/06/17   Netta Cedars, MD  omega-3 acid ethyl esters (LOVAZA) 1 g capsule Take 2 capsules by mouth 2 (two) times daily. 01/27/21   [provider]  pantoprazole (PROTONIX) 40 MG tablet Take 1 tablet (40 mg total) by mouth daily. 10/04/20   Mahala Menghini, PA-C  potassium chloride (KLOR-CON) 10 MEQ tablet Take 10 mEq by mouth daily.    [provider]  pravastatin (PRAVACHOL) 80 MG tablet Take 1 tablet (80 mg total) by mouth at bedtime. 09/16/17   Kayleen Memos, DO  RESTASIS 0.05 % ophthalmic emulsion Place 1 drop into both eyes 2 (two) times daily. 08/21/20   [provider]  torsemide (DEMADEX) 20 MG tablet Take 2 tablets (40 mg total) by mouth daily. Patient taking differently: Take 10-20 mg by mouth daily as needed (swelling). 07/02/15   Samuella Cota, MD  traMADol (ULTRAM) 50 MG tablet Take 50 mg by mouth 3 (three) times daily as needed for moderate pain. 01/07/18   [provider]  traZODone (DESYREL) 50 MG tablet Take 50-100 mg by mouth at bedtime as needed for sleep.    [provider]  vitamin B-12 (CYANOCOBALAMIN) 1000 MCG tablet Take 1,000 mcg by mouth daily.    [provider]  XULTOPHY 100-3.6 UNIT-MG/ML SOPN  Inject 85 Units as directed at bedtime. 03/22/20   Barton Dubois, MD    Family History Family History  Problem Relation Age of Onset   Hypertension Mother    Diabetes Mother    COPD Mother    Arthritis Mother    Diabetes Father    Arthritis Father    Dementia  Father    CAD Father    Hypothyroidism Sister    Diabetes Brother    Colon cancer Niece    Colon polyps Neg Hx    Social History Social History   Tobacco Use   Smoking status: Former    Packs/day: 0.25    Years: 30.00    Pack years: 7.50    Types: Cigarettes    Quit date: 02/17/2011    Years since quitting: 10.1   Smokeless tobacco: Never  Vaping Use   Vaping Use: Never used  Substance Use Topics   Alcohol use: Not Currently    Comment: rare   Drug use: No    Allergies   Tetracyclines & related, Banana, and Penicillins   Review of Systems Review of Systems Per HPI  Physical Exam Triage Vital Signs ED Triage Vitals  Enc Vitals Group     BP 03/21/21 1125 (!) 162/99     Pulse --      Resp 03/21/21 1125 (!) 26     Temp 03/21/21 1125 97.9 F (36.6 C)     Temp Source 03/21/21 1125 Oral     SpO2 03/21/21 1125 95 %     Weight --      Height --      Head Circumference --      Peak Flow --      Pain Score 03/21/21 1130 0     Pain Loc --      Pain Edu? --      Excl. in [place]? --    No data found.  Updated Vital Signs BP (!) 162/99    Temp 97.9 F (36.6 C) (Oral)    Resp (!) 26    SpO2 95%   Visual Acuity Right Eye Distance:   Left Eye Distance:   Bilateral Distance:    Right Eye Near:   Left Eye Near:    Bilateral Near:     Physical Exam Vitals and nursing note reviewed.  Constitutional:      Appearance: Normal appearance. She is not ill-appearing.  HENT:     Head: Atraumatic.     Right Ear: Tympanic membrane and external ear normal.     Left Ear: Tympanic membrane and external ear normal.     Nose: Congestion present.     Mouth/Throat:     Mouth: Mucous membranes are moist.      Pharynx: Posterior oropharyngeal erythema present.  Eyes:     Extraocular Movements: Extraocular movements intact.     Conjunctiva/sclera: Conjunctivae normal.  Cardiovascular:     Rate and Rhythm: Normal rate and regular rhythm.     Heart sounds: Normal heart sounds.  Pulmonary:     Effort: Pulmonary effort is normal.     Breath sounds: Wheezing and rales present.     Comments: Mild to moderate diffuse wheezes, scattered rales On continuous O2 via nasal cannula, not speaking in full sentences, appears mildly labored Musculoskeletal:        General: Normal range of motion.     Cervical back: Normal range of motion and neck supple.  Skin:    General: Skin is warm and dry.  Neurological:     Mental Status: She is alert and oriented to person, place, and time.  Psychiatric:        Mood and Affect: Mood normal.        Thought Content: Thought content normal.        Judgment: Judgment normal.  UC Treatments / Results  Labs (all labs ordered are listed, but only abnormal results are displayed) Labs Reviewed - No data to display  EKG   Radiology No results found.  Procedures Procedures (including critical care time)  Medications Ordered in UC Medications - No data to display  Initial Impression / Assessment and Plan / UC Course  I have reviewed the triage vital signs and the nursing notes.  Pertinent labs & imaging results that were available during my care of the patient were reviewed by me and considered in my medical decision making (see chart for details).     Tachypneic and mildly hypertensive in triage, otherwise vital signs reassuring.  X-ray today showing interstitial pneumonia.  We will treat with Levaquin, prednisone, albuterol inhaler, home maintenance inhaler regimen, supportive medications and home care.  Strict ED precautions given for any acutely worsening symptoms.  Continue continuous use of home O2.  Final Clinical Impressions(s) / UC Diagnoses    Final diagnoses:  Pneumonia, interstitial (HCC)  COPD exacerbation ([place])  Tachypnea  Elevated blood pressure reading     Discharge Instructions      Go to the emergency department if worsening anytime.    ED Prescriptions     Medication Sig Dispense Auth. Provider   levofloxacin (LEVAQUIN) 500 MG tablet Take 1 tablet (500 mg total) by mouth daily. 7 tablet Volney American, [place]   predniSONE (DELTASONE) 20 MG tablet Take 2 tablets (40 mg total) by mouth daily with breakfast. 10 tablet Volney American, PA-C   albuterol (VENTOLIN HFA) 108 (90 Base) MCG/ACT inhaler Inhale 1-2 puffs into the lungs every 6 (six) hours as needed for wheezing or shortness of breath. 18 g Volney American, [place]      PDMP not reviewed this encounter.   Volney American, [place] 03/25/21 1746

## 2021-04-03 ENCOUNTER — Other Ambulatory Visit (HOSPITAL_COMMUNITY)
Admission: RE | Admit: 2021-04-03 | Discharge: 2021-04-03 | Disposition: A | Discharge status: Home / Self Care | Payer: Medicare Other | Source: Ambulatory Visit | Attending: Internal Medicine | Admitting: Internal Medicine

## 2021-04-03 DIAGNOSIS — Z79899 Other long term (current) drug therapy: Secondary | ICD-10-CM | POA: Insufficient documentation

## 2021-04-03 LAB — COMPREHENSIVE METABOLIC PANEL
ALT: 55 U/L — ABNORMAL HIGH (ref 0–44)
AST: 38 U/L (ref 15–41)
Albumin: 2.4 g/dL — ABNORMAL LOW (ref 3.5–5.0)
Alkaline Phosphatase: 460 U/L — ABNORMAL HIGH (ref 38–126)
Anion gap: 12 (ref 5–15)
BUN: 16 mg/dL (ref 8–23)
CO2: 26 mmol/L (ref 22–32)
Calcium: 8.3 mg/dL — ABNORMAL LOW (ref 8.9–10.3)
Chloride: 97 mmol/L — ABNORMAL LOW (ref 98–111)
Creatinine, Ser: 1.15 mg/dL — ABNORMAL HIGH (ref 0.44–1.00)
GFR, Estimated: 51 mL/min — ABNORMAL LOW (ref >60)
Glucose, Bld: 263 mg/dL — ABNORMAL HIGH (ref 70–99)
Potassium: 3.6 mmol/L (ref 3.5–5.1)
Sodium: 135 mmol/L (ref 135–145)
Total Bilirubin: 1.2 mg/dL (ref 0.3–1.2)
Total Protein: 6.8 g/dL (ref 6.5–8.1)

## 2021-04-03 LAB — CBC WITH DIFFERENTIAL/PLATELET
Abs Immature Granulocytes: 0.04 10*3/uL (ref 0.00–0.07)
Basophils Absolute: 0 10*3/uL (ref 0.0–0.1)
Basophils Relative: 0 %
Eosinophils Absolute: 0.3 10*3/uL (ref 0.0–0.5)
Eosinophils Relative: 3 %
HCT: 38.5 % (ref 36.0–46.0)
Hemoglobin: 12 g/dL (ref 12.0–15.0)
Immature Granulocytes: 0 %
Lymphocytes Relative: 26 %
Lymphs Abs: 2.6 10*3/uL (ref 0.7–4.0)
MCH: 27.6 pg (ref 26.0–34.0)
MCHC: 31.2 g/dL (ref 30.0–36.0)
MCV: 88.7 fL (ref 80.0–100.0)
Monocytes Absolute: 0.9 10*3/uL (ref 0.1–1.0)
Monocytes Relative: 9 %
Neutro Abs: 6.1 10*3/uL (ref 1.7–7.7)
Neutrophils Relative %: 62 %
Platelets: 207 10*3/uL (ref 150–400)
RBC: 4.34 MIL/uL (ref 3.87–5.11)
RDW: 14.1 % (ref 11.5–15.5)
WBC: 10 10*3/uL (ref 4.0–10.5)
nRBC: 0 % (ref 0.0–0.2)

## 2021-04-03 LAB — HEMOGLOBIN A1C
Hgb A1c MFr Bld: 8.3 % — ABNORMAL HIGH (ref 4.8–5.6)
Mean Plasma Glucose: 191.51 mg/dL

## 2021-04-03 LAB — LIPID PANEL
Cholesterol: 260 mg/dL — ABNORMAL HIGH (ref 0–200)
HDL: 74 mg/dL (ref >40)
LDL Cholesterol: 164 mg/dL — ABNORMAL HIGH (ref 0–99)
Total CHOL/HDL Ratio: 3.5 RATIO
Triglycerides: 108 mg/dL (ref <150)
VLDL: 22 mg/dL (ref 0–40)

## 2021-04-03 LAB — TSH: TSH: 3.483 u[IU]/mL (ref 0.350–4.500)

## 2021-04-03 LAB — VITAMIN D 25 HYDROXY (VIT D DEFICIENCY, FRACTURES): Vit D, 25-Hydroxy: 23.1 ng/mL — ABNORMAL LOW (ref 30–100)

## 2021-04-04 LAB — MICROALBUMIN, URINE: Microalb, Ur: 5373.1 ug/mL — ABNORMAL HIGH

## 2021-05-12 ENCOUNTER — Inpatient Hospital Stay (HOSPITAL_COMMUNITY): Payer: Medicare Other | Attending: Hematology

## 2021-05-12 DIAGNOSIS — D509 Iron deficiency anemia, unspecified: Secondary | ICD-10-CM

## 2021-05-12 DIAGNOSIS — D649 Anemia, unspecified: Secondary | ICD-10-CM | POA: Insufficient documentation

## 2021-05-12 DIAGNOSIS — D472 Monoclonal gammopathy: Secondary | ICD-10-CM | POA: Insufficient documentation

## 2021-05-12 LAB — FERRITIN: Ferritin: 81 ng/mL (ref 11–307)

## 2021-05-12 LAB — CBC WITH DIFFERENTIAL/PLATELET
Abs Immature Granulocytes: 0.02 10*3/uL (ref 0.00–0.07)
Basophils Absolute: 0.1 10*3/uL (ref 0.0–0.1)
Basophils Relative: 1 %
Eosinophils Absolute: 0.2 10*3/uL (ref 0.0–0.5)
Eosinophils Relative: 3 %
HCT: 40.1 % (ref 36.0–46.0)
Hemoglobin: 12.3 g/dL (ref 12.0–15.0)
Immature Granulocytes: 0 %
Lymphocytes Relative: 23 %
Lymphs Abs: 1.8 10*3/uL (ref 0.7–4.0)
MCH: 27.3 pg (ref 26.0–34.0)
MCHC: 30.7 g/dL (ref 30.0–36.0)
MCV: 88.9 fL (ref 80.0–100.0)
Monocytes Absolute: 0.6 10*3/uL (ref 0.1–1.0)
Monocytes Relative: 8 %
Neutro Abs: 5.2 10*3/uL (ref 1.7–7.7)
Neutrophils Relative %: 65 %
Platelets: 231 10*3/uL (ref 150–400)
RBC: 4.51 MIL/uL (ref 3.87–5.11)
RDW: 13.4 % (ref 11.5–15.5)
WBC: 7.9 10*3/uL (ref 4.0–10.5)
nRBC: 0 % (ref 0.0–0.2)

## 2021-05-12 LAB — COMPREHENSIVE METABOLIC PANEL
ALT: 36 U/L (ref 0–44)
AST: 37 U/L (ref 15–41)
Albumin: 2.5 g/dL — ABNORMAL LOW (ref 3.5–5.0)
Alkaline Phosphatase: 373 U/L — ABNORMAL HIGH (ref 38–126)
Anion gap: 6 (ref 5–15)
BUN: 17 mg/dL (ref 8–23)
CO2: 26 mmol/L (ref 22–32)
Calcium: 8.5 mg/dL — ABNORMAL LOW (ref 8.9–10.3)
Chloride: 104 mmol/L (ref 98–111)
Creatinine, Ser: 1.17 mg/dL — ABNORMAL HIGH (ref 0.44–1.00)
GFR, Estimated: 50 mL/min — ABNORMAL LOW (ref >60)
Glucose, Bld: 331 mg/dL — ABNORMAL HIGH (ref 70–99)
Potassium: 3.9 mmol/L (ref 3.5–5.1)
Sodium: 136 mmol/L (ref 135–145)
Total Bilirubin: 0.6 mg/dL (ref 0.3–1.2)
Total Protein: 7.4 g/dL (ref 6.5–8.1)

## 2021-05-12 LAB — IRON AND TIBC
Iron: 54 ug/dL (ref 28–170)
Saturation Ratios: 19 % (ref 10.4–31.8)
TIBC: 291 ug/dL (ref 250–450)
UIBC: 237 ug/dL

## 2021-05-12 LAB — LACTATE DEHYDROGENASE: LDH: 179 U/L (ref 98–192)

## 2021-05-13 LAB — BETA 2 MICROGLOBULIN, SERUM: Beta-2 Microglobulin: 3 mg/L — ABNORMAL HIGH (ref 0.6–2.4)

## 2021-05-13 LAB — KAPPA/LAMBDA LIGHT CHAINS
Kappa free light chain: 899.8 mg/L — ABNORMAL HIGH (ref 3.3–19.4)
Kappa, lambda light chain ratio: 48.12 — ABNORMAL HIGH (ref 0.26–1.65)
Lambda free light chains: 18.7 mg/L (ref 5.7–26.3)

## 2021-05-14 LAB — PROTEIN ELECTROPHORESIS, SERUM
A/G Ratio: 0.6 — ABNORMAL LOW (ref 0.7–1.7)
Albumin ELP: 2.4 g/dL — ABNORMAL LOW (ref 2.9–4.4)
Alpha-1-Globulin: 0.3 g/dL (ref 0.0–0.4)
Alpha-2-Globulin: 0.9 g/dL (ref 0.4–1.0)
Beta Globulin: 1.2 g/dL (ref 0.7–1.3)
Gamma Globulin: 1.9 g/dL — ABNORMAL HIGH (ref 0.4–1.8)
Globulin, Total: 4.3 g/dL — ABNORMAL HIGH (ref 2.2–3.9)
M-Spike, %: 1.3 g/dL — ABNORMAL HIGH
Total Protein ELP: 6.7 g/dL (ref 6.0–8.5)

## 2021-05-15 LAB — IMMUNOFIXATION ELECTROPHORESIS
IgA: 193 mg/dL (ref 87–352)
IgG (Immunoglobin G), Serum: 2201 mg/dL — ABNORMAL HIGH (ref 586–1602)
IgM (Immunoglobulin M), Srm: 62 mg/dL (ref 26–217)
Total Protein ELP: 6.8 g/dL (ref 6.0–8.5)

## 2021-05-16 ENCOUNTER — Other Ambulatory Visit: Payer: Self-pay

## 2021-05-16 ENCOUNTER — Encounter (HOSPITAL_COMMUNITY): Payer: Self-pay | Admitting: Emergency Medicine

## 2021-05-16 ENCOUNTER — Inpatient Hospital Stay (HOSPITAL_COMMUNITY)
Admission: EM | Admit: 2021-05-16 | Discharge: 2021-05-21 | DRG: 064 | Disposition: A | Discharge status: Home / Self Care | Payer: Medicare Other | Attending: Internal Medicine | Admitting: Internal Medicine

## 2021-05-16 DIAGNOSIS — M797 Fibromyalgia: Secondary | ICD-10-CM | POA: Diagnosis present

## 2021-05-16 DIAGNOSIS — Z880 Allergy status to penicillin: Secondary | ICD-10-CM

## 2021-05-16 DIAGNOSIS — Z8701 Personal history of pneumonia (recurrent): Secondary | ICD-10-CM

## 2021-05-16 DIAGNOSIS — Z794 Long term (current) use of insulin: Secondary | ICD-10-CM

## 2021-05-16 DIAGNOSIS — Z7952 Long term (current) use of systemic steroids: Secondary | ICD-10-CM

## 2021-05-16 DIAGNOSIS — Z8673 Personal history of transient ischemic attack (TIA), and cerebral infarction without residual deficits: Secondary | ICD-10-CM | POA: Diagnosis present

## 2021-05-16 DIAGNOSIS — Z7984 Long term (current) use of oral hypoglycemic drugs: Secondary | ICD-10-CM

## 2021-05-16 DIAGNOSIS — E1169 Type 2 diabetes mellitus with other specified complication: Secondary | ICD-10-CM

## 2021-05-16 DIAGNOSIS — J449 Chronic obstructive pulmonary disease, unspecified: Secondary | ICD-10-CM | POA: Diagnosis present

## 2021-05-16 DIAGNOSIS — E039 Hypothyroidism, unspecified: Secondary | ICD-10-CM | POA: Diagnosis present

## 2021-05-16 DIAGNOSIS — Z6836 Body mass index (BMI) 36.0-36.9, adult: Secondary | ICD-10-CM

## 2021-05-16 DIAGNOSIS — Z90711 Acquired absence of uterus with remaining cervical stump: Secondary | ICD-10-CM

## 2021-05-16 DIAGNOSIS — E119 Type 2 diabetes mellitus without complications: Secondary | ICD-10-CM

## 2021-05-16 DIAGNOSIS — Z881 Allergy status to other antibiotic agents status: Secondary | ICD-10-CM

## 2021-05-16 DIAGNOSIS — J441 Chronic obstructive pulmonary disease with (acute) exacerbation: Principal | ICD-10-CM

## 2021-05-16 DIAGNOSIS — M6281 Muscle weakness (generalized): Secondary | ICD-10-CM | POA: Diagnosis present

## 2021-05-16 DIAGNOSIS — Z7901 Long term (current) use of anticoagulants: Secondary | ICD-10-CM

## 2021-05-16 DIAGNOSIS — R4781 Slurred speech: Secondary | ICD-10-CM | POA: Diagnosis present

## 2021-05-16 DIAGNOSIS — E440 Moderate protein-calorie malnutrition: Secondary | ICD-10-CM | POA: Diagnosis present

## 2021-05-16 DIAGNOSIS — Z9189 Other specified personal risk factors, not elsewhere classified: Secondary | ICD-10-CM

## 2021-05-16 DIAGNOSIS — Z91018 Allergy to other foods: Secondary | ICD-10-CM

## 2021-05-16 DIAGNOSIS — Z7951 Long term (current) use of inhaled steroids: Secondary | ICD-10-CM

## 2021-05-16 DIAGNOSIS — E876 Hypokalemia: Secondary | ICD-10-CM | POA: Diagnosis present

## 2021-05-16 DIAGNOSIS — M1990 Unspecified osteoarthritis, unspecified site: Secondary | ICD-10-CM | POA: Diagnosis present

## 2021-05-16 DIAGNOSIS — Z87891 Personal history of nicotine dependence: Secondary | ICD-10-CM

## 2021-05-16 DIAGNOSIS — R270 Ataxia, unspecified: Secondary | ICD-10-CM | POA: Diagnosis present

## 2021-05-16 DIAGNOSIS — E1165 Type 2 diabetes mellitus with hyperglycemia: Secondary | ICD-10-CM | POA: Diagnosis present

## 2021-05-16 DIAGNOSIS — R29898 Other symptoms and signs involving the musculoskeletal system: Secondary | ICD-10-CM | POA: Diagnosis present

## 2021-05-16 DIAGNOSIS — Z86718 Personal history of other venous thrombosis and embolism: Secondary | ICD-10-CM

## 2021-05-16 DIAGNOSIS — I110 Hypertensive heart disease with heart failure: Secondary | ICD-10-CM | POA: Diagnosis present

## 2021-05-16 DIAGNOSIS — Z79899 Other long term (current) drug therapy: Secondary | ICD-10-CM

## 2021-05-16 DIAGNOSIS — Z8249 Family history of ischemic heart disease and other diseases of the circulatory system: Secondary | ICD-10-CM

## 2021-05-16 DIAGNOSIS — Z833 Family history of diabetes mellitus: Secondary | ICD-10-CM

## 2021-05-16 DIAGNOSIS — Z8261 Family history of arthritis: Secondary | ICD-10-CM

## 2021-05-16 DIAGNOSIS — R29700 NIHSS score 0: Secondary | ICD-10-CM | POA: Diagnosis present

## 2021-05-16 DIAGNOSIS — I6340 Cerebral infarction due to embolism of unspecified cerebral artery: Principal | ICD-10-CM | POA: Diagnosis present

## 2021-05-16 DIAGNOSIS — G4733 Obstructive sleep apnea (adult) (pediatric): Secondary | ICD-10-CM | POA: Diagnosis present

## 2021-05-16 DIAGNOSIS — K219 Gastro-esophageal reflux disease without esophagitis: Secondary | ICD-10-CM | POA: Diagnosis present

## 2021-05-16 DIAGNOSIS — Z20822 Contact with and (suspected) exposure to covid-19: Secondary | ICD-10-CM | POA: Diagnosis present

## 2021-05-16 DIAGNOSIS — I10 Essential (primary) hypertension: Secondary | ICD-10-CM | POA: Diagnosis present

## 2021-05-16 DIAGNOSIS — F0390 Unspecified dementia without behavioral disturbance: Secondary | ICD-10-CM | POA: Diagnosis present

## 2021-05-16 DIAGNOSIS — Z825 Family history of asthma and other chronic lower respiratory diseases: Secondary | ICD-10-CM

## 2021-05-16 DIAGNOSIS — I639 Cerebral infarction, unspecified: Secondary | ICD-10-CM | POA: Diagnosis present

## 2021-05-16 DIAGNOSIS — E7800 Pure hypercholesterolemia, unspecified: Secondary | ICD-10-CM | POA: Diagnosis present

## 2021-05-16 DIAGNOSIS — Z79890 Hormone replacement therapy (postmenopausal): Secondary | ICD-10-CM

## 2021-05-16 DIAGNOSIS — I5043 Acute on chronic combined systolic (congestive) and diastolic (congestive) heart failure: Secondary | ICD-10-CM | POA: Diagnosis present

## 2021-05-16 DIAGNOSIS — R4701 Aphasia: Secondary | ICD-10-CM | POA: Diagnosis present

## 2021-05-16 MED ORDER — ALBUTEROL SULFATE (2.5 MG/3ML) 0.083% IN NEBU
INHALATION_SOLUTION | RESPIRATORY_TRACT | Status: AC
  Administered 2021-05-16: 2.5 mg
  Filled 2021-05-16: qty 3

## 2021-05-16 MED ORDER — IPRATROPIUM-ALBUTEROL 0.5-2.5 (3) MG/3ML IN SOLN
RESPIRATORY_TRACT | Status: AC
Start: 2021-05-16 — End: 2021-05-16
  Administered 2021-05-16: 3 mL
  Filled 2021-05-16: qty 3

## 2021-05-16 NOTE — ED Notes (Signed)
Pt reports she did not take demadex today ?
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
## 2021-05-17 ENCOUNTER — Emergency Department (HOSPITAL_COMMUNITY): Payer: Medicare Other

## 2021-05-17 ENCOUNTER — Observation Stay (HOSPITAL_COMMUNITY): Payer: Medicare Other

## 2021-05-17 ENCOUNTER — Observation Stay (HOSPITAL_BASED_OUTPATIENT_CLINIC_OR_DEPARTMENT_OTHER): Payer: Medicare Other

## 2021-05-17 DIAGNOSIS — E1165 Type 2 diabetes mellitus with hyperglycemia: Secondary | ICD-10-CM

## 2021-05-17 DIAGNOSIS — I6389 Other cerebral infarction: Secondary | ICD-10-CM

## 2021-05-17 DIAGNOSIS — K219 Gastro-esophageal reflux disease without esophagitis: Secondary | ICD-10-CM | POA: Diagnosis not present

## 2021-05-17 DIAGNOSIS — I639 Cerebral infarction, unspecified: Secondary | ICD-10-CM | POA: Diagnosis present

## 2021-05-17 DIAGNOSIS — E440 Moderate protein-calorie malnutrition: Secondary | ICD-10-CM | POA: Diagnosis present

## 2021-05-17 DIAGNOSIS — J449 Chronic obstructive pulmonary disease, unspecified: Secondary | ICD-10-CM

## 2021-05-17 DIAGNOSIS — R29898 Other symptoms and signs involving the musculoskeletal system: Secondary | ICD-10-CM | POA: Diagnosis not present

## 2021-05-17 DIAGNOSIS — I10 Essential (primary) hypertension: Secondary | ICD-10-CM

## 2021-05-17 DIAGNOSIS — Z794 Long term (current) use of insulin: Secondary | ICD-10-CM

## 2021-05-17 DIAGNOSIS — E039 Hypothyroidism, unspecified: Secondary | ICD-10-CM

## 2021-05-17 DIAGNOSIS — Z8673 Personal history of transient ischemic attack (TIA), and cerebral infarction without residual deficits: Secondary | ICD-10-CM | POA: Diagnosis present

## 2021-05-17 LAB — COMPREHENSIVE METABOLIC PANEL
ALT: 33 U/L (ref 0–44)
ALT: 35 U/L (ref 0–44)
AST: 27 U/L (ref 15–41)
AST: 30 U/L (ref 15–41)
Albumin: 2.6 g/dL — ABNORMAL LOW (ref 3.5–5.0)
Albumin: 2.6 g/dL — ABNORMAL LOW (ref 3.5–5.0)
Alkaline Phosphatase: 350 U/L — ABNORMAL HIGH (ref 38–126)
Alkaline Phosphatase: 351 U/L — ABNORMAL HIGH (ref 38–126)
Anion gap: 6 (ref 5–15)
Anion gap: 8 (ref 5–15)
BUN: 17 mg/dL (ref 8–23)
BUN: 19 mg/dL (ref 8–23)
CO2: 23 mmol/L (ref 22–32)
CO2: 23 mmol/L (ref 22–32)
Calcium: 8.6 mg/dL — ABNORMAL LOW (ref 8.9–10.3)
Calcium: 8.8 mg/dL — ABNORMAL LOW (ref 8.9–10.3)
Chloride: 106 mmol/L (ref 98–111)
Chloride: 106 mmol/L (ref 98–111)
Creatinine, Ser: 0.98 mg/dL (ref 0.44–1.00)
Creatinine, Ser: 1.19 mg/dL — ABNORMAL HIGH (ref 0.44–1.00)
GFR, Estimated: 49 mL/min — ABNORMAL LOW (ref >60)
GFR, Estimated: >60 mL/min (ref >60)
Glucose, Bld: 152 mg/dL — ABNORMAL HIGH (ref 70–99)
Glucose, Bld: 178 mg/dL — ABNORMAL HIGH (ref 70–99)
Potassium: 3.5 mmol/L (ref 3.5–5.1)
Potassium: 3.7 mmol/L (ref 3.5–5.1)
Sodium: 135 mmol/L (ref 135–145)
Sodium: 137 mmol/L (ref 135–145)
Total Bilirubin: 0.7 mg/dL (ref 0.3–1.2)
Total Bilirubin: 0.8 mg/dL (ref 0.3–1.2)
Total Protein: 7.6 g/dL (ref 6.5–8.1)
Total Protein: 7.7 g/dL (ref 6.5–8.1)

## 2021-05-17 LAB — DIFFERENTIAL
Abs Immature Granulocytes: 0.05 10*3/uL (ref 0.00–0.07)
Basophils Absolute: 0.1 10*3/uL (ref 0.0–0.1)
Basophils Relative: 1 %
Eosinophils Absolute: 0.1 10*3/uL (ref 0.0–0.5)
Eosinophils Relative: 1 %
Immature Granulocytes: 1 %
Lymphocytes Relative: 15 %
Lymphs Abs: 1.6 10*3/uL (ref 0.7–4.0)
Monocytes Absolute: 0.8 10*3/uL (ref 0.1–1.0)
Monocytes Relative: 8 %
Neutro Abs: 7.9 10*3/uL — ABNORMAL HIGH (ref 1.7–7.7)
Neutrophils Relative %: 74 %

## 2021-05-17 LAB — URINALYSIS, ROUTINE W REFLEX MICROSCOPIC
Bilirubin Urine: NEGATIVE
Glucose, UA: 50 mg/dL — AB
Ketones, ur: NEGATIVE mg/dL
Leukocytes,Ua: NEGATIVE
Nitrite: NEGATIVE
Protein, ur: 100 mg/dL — AB
Specific Gravity, Urine: 1.006 (ref 1.005–1.030)
pH: 6 (ref 5.0–8.0)

## 2021-05-17 LAB — RAPID URINE DRUG SCREEN, HOSP PERFORMED
Amphetamines: NOT DETECTED
Barbiturates: NOT DETECTED
Benzodiazepines: NOT DETECTED
Cocaine: NOT DETECTED
Opiates: NOT DETECTED
Tetrahydrocannabinol: NOT DETECTED

## 2021-05-17 LAB — CBC
HCT: 40.2 % (ref 36.0–46.0)
HCT: 42.1 % (ref 36.0–46.0)
Hemoglobin: 12.4 g/dL (ref 12.0–15.0)
Hemoglobin: 12.6 g/dL (ref 12.0–15.0)
MCH: 25.9 pg — ABNORMAL LOW (ref 26.0–34.0)
MCH: 27.6 pg (ref 26.0–34.0)
MCHC: 29.5 g/dL — ABNORMAL LOW (ref 30.0–36.0)
MCHC: 31.3 g/dL (ref 30.0–36.0)
MCV: 88 fL (ref 80.0–100.0)
MCV: 88.1 fL (ref 80.0–100.0)
Platelets: 234 10*3/uL (ref 150–400)
Platelets: 237 10*3/uL (ref 150–400)
RBC: 4.57 MIL/uL (ref 3.87–5.11)
RBC: 4.78 MIL/uL (ref 3.87–5.11)
RDW: 13.5 % (ref 11.5–15.5)
RDW: 13.7 % (ref 11.5–15.5)
WBC: 10.4 10*3/uL (ref 4.0–10.5)
WBC: 11.4 10*3/uL — ABNORMAL HIGH (ref 4.0–10.5)
nRBC: 0 % (ref 0.0–0.2)
nRBC: 0 % (ref 0.0–0.2)

## 2021-05-17 LAB — ECHOCARDIOGRAM COMPLETE
AR max vel: 2.38 cm2
AV Area VTI: 2.68 cm2
AV Area mean vel: 2.42 cm2
AV Mean grad: 3 mmHg
AV Peak grad: 5.7 mmHg
Ao pk vel: 1.19 m/s
Area-P 1/2: 4.93 cm2
Calc EF: 47.4 %
Height: 68 in
MV VTI: 2.65 cm2
S' Lateral: 4 cm
Single Plane A2C EF: 41.4 %
Single Plane A4C EF: 50.8 %
Weight: 3584 oz

## 2021-05-17 LAB — RESP PANEL BY RT-PCR (FLU A&B, COVID) ARPGX2
Influenza A by PCR: NEGATIVE
Influenza B by PCR: NEGATIVE
SARS Coronavirus 2 by RT PCR: NEGATIVE

## 2021-05-17 LAB — PROTIME-INR
INR: 1.1 (ref 0.8–1.2)
Prothrombin Time: 14.6 seconds (ref 11.4–15.2)

## 2021-05-17 LAB — ETHANOL: Alcohol, Ethyl (B): <10 mg/dL (ref <10)

## 2021-05-17 LAB — APTT: aPTT: 32 seconds (ref 24–36)

## 2021-05-17 LAB — GLUCOSE, CAPILLARY
Glucose-Capillary: 115 mg/dL — ABNORMAL HIGH (ref 70–99)
Glucose-Capillary: 235 mg/dL — ABNORMAL HIGH (ref 70–99)

## 2021-05-17 LAB — LIPID PANEL
Cholesterol: 254 mg/dL — ABNORMAL HIGH (ref 0–200)
HDL: 75 mg/dL (ref >40)
LDL Cholesterol: 166 mg/dL — ABNORMAL HIGH (ref 0–99)
Total CHOL/HDL Ratio: 3.4 RATIO
Triglycerides: 67 mg/dL (ref <150)
VLDL: 13 mg/dL (ref 0–40)

## 2021-05-17 LAB — HEMOGLOBIN A1C
Hgb A1c MFr Bld: 8.3 % — ABNORMAL HIGH (ref 4.8–5.6)
Mean Plasma Glucose: 191.51 mg/dL

## 2021-05-17 LAB — CBG MONITORING, ED
Glucose-Capillary: 130 mg/dL — ABNORMAL HIGH (ref 70–99)
Glucose-Capillary: 156 mg/dL — ABNORMAL HIGH (ref 70–99)
Glucose-Capillary: 173 mg/dL — ABNORMAL HIGH (ref 70–99)

## 2021-05-17 LAB — BRAIN NATRIURETIC PEPTIDE: B Natriuretic Peptide: 1488 pg/mL — ABNORMAL HIGH (ref 0.0–100.0)

## 2021-05-17 LAB — HIV ANTIBODY (ROUTINE TESTING W REFLEX): HIV Screen 4th Generation wRfx: NONREACTIVE

## 2021-05-17 MED ORDER — LORAZEPAM 1 MG PO TABS
1.0000 mg | ORAL_TABLET | Freq: Once | ORAL | Status: AC
  Administered 2021-05-17: 1 mg via ORAL
  Filled 2021-05-17: qty 1

## 2021-05-17 MED ORDER — ALBUTEROL SULFATE (2.5 MG/3ML) 0.083% IN NEBU
2.5000 mg | INHALATION_SOLUTION | RESPIRATORY_TRACT | Status: DC | PRN
  Administered 2021-05-17: 2.5 mg via RESPIRATORY_TRACT
  Filled 2021-05-17: qty 3

## 2021-05-17 MED ORDER — DULOXETINE HCL 30 MG PO CPEP
30.0000 mg | ORAL_CAPSULE | Freq: Two times a day (BID) | ORAL | Status: DC
Start: 2021-05-17 — End: 2021-05-21
  Administered 2021-05-17 – 2021-05-21 (×9): 30 mg via ORAL
  Filled 2021-05-17 (×9): qty 1

## 2021-05-17 MED ORDER — APIXABAN 5 MG PO TABS
5.0000 mg | ORAL_TABLET | Freq: Two times a day (BID) | ORAL | Status: DC
Start: 2021-05-17 — End: 2021-05-17

## 2021-05-17 MED ORDER — FUROSEMIDE 10 MG/ML IJ SOLN
40.0000 mg | Freq: Once | INTRAMUSCULAR | Status: AC
  Administered 2021-05-17: 40 mg via INTRAVENOUS
  Filled 2021-05-17: qty 4

## 2021-05-17 MED ORDER — LEVOTHYROXINE SODIUM 88 MCG PO TABS: 88.0000 ug | ORAL_TABLET | Freq: Every day | ORAL | Status: DC

## 2021-05-17 MED ORDER — OMEGA-3-ACID ETHYL ESTERS 1 G PO CAPS
2.0000 | ORAL_CAPSULE | Freq: Two times a day (BID) | ORAL | Status: DC
  Administered 2021-05-19 – 2021-05-21 (×5): 2 g via ORAL
  Filled 2021-05-17 (×8): qty 2

## 2021-05-17 MED ORDER — TRAZODONE HCL 50 MG PO TABS
50.0000 mg | ORAL_TABLET | Freq: Every evening | ORAL | Status: DC | PRN
  Administered 2021-05-17: 100 mg via ORAL
  Administered 2021-05-18 – 2021-05-20 (×2): 50 mg via ORAL
  Filled 2021-05-17 (×2): qty 1
  Filled 2021-05-17: qty 2

## 2021-05-17 MED ORDER — BUDESONIDE 0.25 MG/2ML IN SUSP
0.2500 mg | Freq: Two times a day (BID) | RESPIRATORY_TRACT | Status: DC
  Administered 2021-05-17 – 2021-05-21 (×9): 0.25 mg via RESPIRATORY_TRACT
  Filled 2021-05-17 (×9): qty 2

## 2021-05-17 MED ORDER — ACETAMINOPHEN 650 MG RE SUPP: 650.0000 mg | RECTAL | Status: DC | PRN

## 2021-05-17 MED ORDER — PANTOPRAZOLE SODIUM 40 MG PO TBEC
40.0000 mg | DELAYED_RELEASE_TABLET | Freq: Every day | ORAL | Status: DC
  Administered 2021-05-17 – 2021-05-21 (×5): 40 mg via ORAL
  Filled 2021-05-17 (×5): qty 1

## 2021-05-17 MED ORDER — ONDANSETRON HCL 4 MG/2ML IJ SOLN
4.0000 mg | Freq: Four times a day (QID) | INTRAMUSCULAR | Status: DC | PRN
Start: 2021-05-17 — End: 2021-05-21

## 2021-05-17 MED ORDER — INSULIN ASPART 100 UNIT/ML IJ SOLN
0.0000 [IU] | Freq: Every day | INTRAMUSCULAR | Status: DC
  Administered 2021-05-17 – 2021-05-20 (×2): 2 [IU] via SUBCUTANEOUS

## 2021-05-17 MED ORDER — INSULIN DETEMIR 100 UNIT/ML ~~LOC~~ SOLN
65.0000 [IU] | Freq: Every day | SUBCUTANEOUS | Status: DC
  Administered 2021-05-17 – 2021-05-18 (×2): 65 [IU] via SUBCUTANEOUS
  Filled 2021-05-17 (×4): qty 0.65

## 2021-05-17 MED ORDER — INSULIN ASPART 100 UNIT/ML IJ SOLN
0.0000 [IU] | Freq: Three times a day (TID) | INTRAMUSCULAR | Status: DC
  Administered 2021-05-17: 4 [IU] via SUBCUTANEOUS
  Administered 2021-05-17 – 2021-05-18 (×2): 3 [IU] via SUBCUTANEOUS
  Administered 2021-05-20 – 2021-05-21 (×2): 4 [IU] via SUBCUTANEOUS
  Administered 2021-05-21: 11 [IU] via SUBCUTANEOUS

## 2021-05-17 MED ORDER — INSULIN ASPART 100 UNIT/ML IJ SOLN
15.0000 [IU] | Freq: Three times a day (TID) | INTRAMUSCULAR | Status: DC
  Administered 2021-05-17 – 2021-05-20 (×7): 15 [IU] via SUBCUTANEOUS
  Filled 2021-05-17 (×2): qty 1

## 2021-05-17 MED ORDER — PREGABALIN 75 MG PO CAPS
75.0000 mg | ORAL_CAPSULE | Freq: Two times a day (BID) | ORAL | Status: DC
  Administered 2021-05-17 – 2021-05-21 (×9): 75 mg via ORAL
  Filled 2021-05-17 (×9): qty 1

## 2021-05-17 MED ORDER — PRAVASTATIN SODIUM 40 MG PO TABS
80.0000 mg | ORAL_TABLET | Freq: Every day | ORAL | Status: DC
  Administered 2021-05-17: 80 mg via ORAL
  Filled 2021-05-17: qty 2

## 2021-05-17 MED ORDER — ACETAMINOPHEN 325 MG PO TABS
650.0000 mg | ORAL_TABLET | ORAL | Status: DC | PRN
  Administered 2021-05-20: 650 mg via ORAL
  Filled 2021-05-17: qty 2

## 2021-05-17 MED ORDER — STROKE: EARLY STAGES OF RECOVERY BOOK
Freq: Once | Status: AC
  Filled 2021-05-17: qty 1

## 2021-05-17 MED ORDER — HYDRALAZINE HCL 20 MG/ML IJ SOLN: 10.0000 mg | Freq: Four times a day (QID) | INTRAMUSCULAR | Status: DC | PRN

## 2021-05-17 MED ORDER — FLUTICASONE FUROATE 100 MCG/ACT IN AEPB: 1.0000 | INHALATION_SPRAY | Freq: Every day | RESPIRATORY_TRACT | Status: DC

## 2021-05-17 MED ORDER — ENOXAPARIN SODIUM 60 MG/0.6ML IJ SOSY
50.0000 mg | PREFILLED_SYRINGE | INTRAMUSCULAR | Status: DC
  Administered 2021-05-17 – 2021-05-19 (×3): 50 mg via SUBCUTANEOUS
  Filled 2021-05-17 (×3): qty 0.6

## 2021-05-17 MED ORDER — SENNOSIDES-DOCUSATE SODIUM 8.6-50 MG PO TABS: 1.0000 | ORAL_TABLET | Freq: Every evening | ORAL | Status: DC | PRN

## 2021-05-17 MED ORDER — HYDROXYZINE HCL 25 MG PO TABS: 25.0000 mg | ORAL_TABLET | Freq: Three times a day (TID) | ORAL | Status: DC | PRN

## 2021-05-17 MED ORDER — ACETAMINOPHEN 160 MG/5ML PO SOLN: 650.0000 mg | ORAL | Status: DC | PRN

## 2021-05-17 MED ORDER — TRAMADOL HCL 50 MG PO TABS
50.0000 mg | ORAL_TABLET | Freq: Three times a day (TID) | ORAL | Status: DC | PRN
  Administered 2021-05-17 – 2021-05-20 (×7): 50 mg via ORAL
  Filled 2021-05-17 (×7): qty 1

## 2021-05-17 MED ORDER — LEVOTHYROXINE SODIUM 88 MCG PO TABS
88.0000 ug | ORAL_TABLET | Freq: Every day | ORAL | Status: DC
  Administered 2021-05-17 – 2021-05-21 (×5): 88 ug via ORAL
  Filled 2021-05-17 (×5): qty 1

## 2021-05-17 NOTE — Progress Notes (Signed)
0020-Code stroke activated ?0028-pt taken to CT ?0039-back from CT ?0043-Dr. Bridgett Larsson joined teleneuro cart ?

## 2021-05-17 NOTE — Assessment & Plan Note (Addendum)
No clinical signs of exacerbation, plan to continue with bronchodilator therapy.  ?Plan to follow up as outpatient.  ? ?

## 2021-05-17 NOTE — Progress Notes (Signed)
Patient received lasix iv as ordered by MD; patient assisted to bathroom about 20 minutes after administration; voided large amount with some incontinence.  Remains short of breath; exertion to the bathroom; continue to monitor. ?

## 2021-05-17 NOTE — Assessment & Plan Note (Addendum)
Continue blood pressure control with bisoprolol and diuresis with torsemide.  ?

## 2021-05-17 NOTE — ED Provider Notes (Signed)
?[place] ?Provider Note ? ? ?CSN: [id number] ?Arrival date & time: 05/16/21  2257 ? ?  ? ?History ? ?Chief Complaint  ?Patient presents with  ? Shortness of Breath  ? ? ?Stephanie [name] is a 71 y.o. female. ? ?Patient is a 71 year old female with past medical history of COPD, diabetes, hypertension, hypothyroidism.  Patient presenting today with complaints of shortness of breath and left hand weakness.  Patient states the shortness of breath started earlier today.  She describes wheezing, but no chest pain, productive cough, or fever.  She has been using her nebulizer with little relief.  At approximately 730 this evening, she developed weakness in her left hand and worsening breathing.  She describes difficulty holding onto objects and closing her hand.  She denies any leg weakness, facial weakness, headache, or visual disturbance. ? ?The history is provided by the patient.  ?Shortness of Breath ?Severity:  Moderate ?Onset quality:  Gradual ?Duration:  1 day ?Timing:  Constant ?Progression:  Worsening ?Chronicity:  New ?Relieved by:  Nothing ?Worsened by:  Nothing ?Ineffective treatments: Nebulizer. ? ?  ? ?Home Medications ?Prior to Admission medications   ?Medication Sig Start Date End Date Taking? Authorizing Provider  ?Accu-Chek FastClix Lancets MISC Apply topically. 03/15/20   [provider]  ?ACCU-CHEK GUIDE test strip 4 (four) times daily. 07/02/20   [provider]  ?acetaminophen (TYLENOL) 500 MG tablet Take 1,000 mg by mouth every 6 (six) hours as needed for moderate pain or headache.    [provider]  ?albuterol (PROVENTIL) (2.5 MG/3ML) 0.083% nebulizer solution Take 2.5 mg by nebulization every 6 (six) hours as needed for wheezing or shortness of breath.    [provider]  ?albuterol (VENTOLIN HFA) 108 (90 Base) MCG/ACT inhaler Inhale 1-2 puffs into the lungs every 6 (six) hours as needed for wheezing or shortness of breath. 03/21/21   Volney American, PA-C  ?Alcohol Swabs (ALCOHOL PADS) 70 % PADS SMARTSIG:Pledget(s) Topical 4 Times Daily 08/12/20   [provider]  ?apixaban (ELIQUIS) 5 MG TABS tablet Take 10 mg by mouth BID through Friday 5/19 evening. Take 73m by mouth BID starting 5/20 AM. ?Patient taking differently: Take 5 mg by mouth 2 (two) times daily. 07/25/15   LLendon Colonel NP  ?ARNUITY ELLIPTA 100 MCG/ACT AEPB Inhale 1 puff into the lungs at bedtime.  06/13/17   [provider]  ?Ascorbic Acid (VITAMIN C) 1000 MG tablet Take 1,000 mg by mouth daily.     [provider]  ?bisoprolol (ZEBETA) 10 MG tablet Take 1 tablet (10 mg total) by mouth in the morning and at bedtime. 01/02/21   SErma Heritage PA-C  ?Blood Glucose Calibration (ACCU-CHEK GUIDE CONTROL) LIQD See admin instructions. 07/02/20   [provider]  ?Blood Glucose Monitoring Suppl (ACCU-CHEK GUIDE ME) w/Device KIT 4 (four) times daily. 07/02/20   [provider]  ?cetirizine (ZYRTEC) 10 MG tablet Take 10 mg by mouth daily.    [provider]  ?Cholecalciferol (VITAMIN D) 2000 units tablet Take 4,000 Units by mouth daily.     [provider]  ?cimetidine (TAGAMET) 200 MG tablet Take 0.5 tablets (100 mg total) by mouth daily as needed. 03/22/20   MBarton Dubois MD  ?CINNAMON PO Take 2 capsules by mouth 3 (three) times daily.    [provider]  ?clobetasol ointment (TEMOVATE) 07.51% Apply 1 application topically 3 (three) times daily as needed (imflammation). 06/20/15   [provider]  ?DULoxetine (CYMBALTA) 30 MG capsule Take 1 capsule (30 mg total) by mouth 2 (two) times daily. 09/16/17   Hall, Carole N, DO  ?fluticasone (VERAMYST) 27.5 MCG/SPRAY nasal spray Place 2 sprays into the nose daily.    [provider]  ?guaiFENesin (ROBITUSSIN) 100 MG/5ML SOLN Take 5 mLs (100 mg total) by mouth every 4 (four) hours as needed for cough or to loosen phlegm. 03/17/20   Shah, Pratik D, DO  ?HUMALOG  KWIKPEN 100 UNIT/ML KiwkPen Inject 1-50 Units into the skin See admin instructions. Inject 50 units with breakfast, 30 units with lunch, and 50 units with dinner. Inject 1-10 units with snacks 09/30/17   [provider]  ?hydrOXYzine (ATARAX/VISTARIL) 25 MG tablet Take 25 mg by mouth every 8 (eight) hours as needed for itching.  06/20/15   [provider]  ?Lancet Devices (EASY MINI EJECT LANCING DEVICE) MISC 4 (four) times daily. 07/02/20   [provider]  ?levofloxacin (LEVAQUIN) 500 MG tablet Take 1 tablet (500 mg total) by mouth daily. 03/21/21   Lane, Rachel Elizabeth, PA-C  ?levothyroxine (SYNTHROID) 88 MCG tablet Take 88 mcg by mouth daily before breakfast.    [provider]  ?lubiprostone (AMITIZA) 8 MCG capsule TAKE 1 CAPSULE(8 MCG) BY MOUTH TWICE DAILY AS NEEDED FOR CONSTIPATION 10/03/20   Harper, Kristen S, PA-C  ?lubiprostone (AMITIZA) 8 MCG capsule Take 1 capsule (8 mcg total) by mouth 2 (two) times daily with a meal. 01/21/21   Boone, Anna W, NP  ?LYRICA 75 MG capsule Take 75 mg by mouth 2 (two) times daily.  07/18/15   [provider]  ?methocarbamol (ROBAXIN) 500 MG tablet Take 1 tablet (500 mg total) by mouth 3 (three) times daily as needed. 08/06/17   Norris, Steve, MD  ?omega-3 acid ethyl esters (LOVAZA) 1 g capsule Take 2 capsules by mouth 2 (two) times daily. 01/27/21   [provider]  ?pantoprazole (PROTONIX) 40 MG tablet Take 1 tablet (40 mg total) by mouth daily. 10/04/20   Lewis, Leslie S, PA-C  ?potassium chloride (KLOR-CON) 10 MEQ tablet Take 10 mEq by mouth daily.    [provider]  ?pravastatin (PRAVACHOL) 80 MG tablet Take 1 tablet (80 mg total) by mouth at bedtime. 09/16/17   Hall, Carole N, DO  ?predniSONE (DELTASONE) 20 MG tablet Take 2 tablets (40 mg total) by mouth daily with breakfast. 03/21/21   Lane, Rachel Elizabeth, PA-C  ?RESTASIS 0.05 % ophthalmic emulsion Place 1 drop into both eyes 2 (two) times daily. 08/21/20   [provider]  ?torsemide (DEMADEX) 20 MG tablet Take 2 tablets (40 mg total) by mouth daily. ?Patient taking differently: Take 10-20 mg by mouth daily as needed (swelling). 07/02/15   Goodrich, Daniel P, MD  ?traMADol (ULTRAM) 50 MG tablet Take 50 mg by mouth 3 (three) times daily as needed for moderate pain. 01/07/18   [provider]  ?traZODone (DESYREL) 50 MG tablet Take 50-100 mg by mouth at bedtime as needed for sleep.    [provider]  ?vitamin B-12 (CYANOCOBALAMIN) 1000 MCG tablet Take 1,000 mcg by mouth daily.    [provider]  ?XULTOPHY 100-3.6 UNIT-MG/ML SOPN Inject 85 Units as directed at bedtime. 03/22/20   Madera, Carlos, MD  ?   ? ?Allergies    ?Tetracyclines & related, Banana, and Penicillins   ? ?Review of Systems   ?Review of Systems  ?Respiratory:  Positive for shortness of breath.   ?All   other systems reviewed and are negative. ? ?Physical Exam ?Updated Vital Signs ?BP (!) 168/99   Pulse (!) 103   Temp 97.9 ?F (36.6 ?C) (Oral)   Resp (!) 25   Ht 5' 8" (1.727 m)   Wt 101.6 kg   SpO2 100%   BMI 34.06 kg/m?  ?Physical Exam ?Vitals and nursing note reviewed.  ?Constitutional:   ?   General: She is not in acute distress. ?   Appearance: She is well-developed. She is not diaphoretic.  ?HENT:  ?   Head: Normocephalic and atraumatic.  ?Cardiovascular:  ?   Rate and Rhythm: Normal rate and regular rhythm.  ?   Heart sounds: No murmur heard. ?  No friction rub. No gallop.  ?Pulmonary:  ?   Effort: Pulmonary effort is normal. No tachypnea, accessory muscle usage or respiratory distress.  ?   Breath sounds: Examination of the right-middle field reveals rhonchi. Examination of the left-middle field reveals rhonchi. Rhonchi present. No wheezing.  ?   Comments: There are slight rhonchi bilaterally. ?Abdominal:  ?   General: Bowel sounds are normal. There is no distension.  ?   Palpations: Abdomen is soft.  ?   Tenderness: There is no abdominal tenderness.  ?Musculoskeletal:      ?   General: Normal range of motion.  ?   Cervical back: Normal range of motion and neck supple.  ?Skin: ?   General: Skin is warm and dry.  ?Neurological:  ?   General: No focal deficit present.  ?   Ment

## 2021-05-17 NOTE — ED Notes (Signed)
Pt resting comfortably with daughter at bedside.  ?

## 2021-05-17 NOTE — Assessment & Plan Note (Addendum)
Body mass index is 36.75 kg/m?Marland Kitchen  Continue to encourage increased oral intake. ?Obesity class 2  ? ?

## 2021-05-17 NOTE — Progress Notes (Signed)
This is a 71 year old female with medical history significant of with history of COPD, DVT on Eliquis, GERD, hyperlipidemia, hypertension, hypothyroidism, type 2 diabetes mellitus, TIA who presented to ED with the complaint of shortness of breath and left hand numbness and weakness.  Chest x-ray showed possible pulmonary edema.  BNP also significantly elevated from her baseline, presented with 1488 while it was only 59 about a year ago.  Echo now shows slightly reduced ejection fraction of 45 to 50% as opposed to 50 to 55% last year.  She appears to be having acute on chronic combined systolic and diastolic congestive heart failure.  On examination, she is very obese lady.  Her major complaint at this point in time is left hand numbness which actually she says " comes and goes".  Unable to hear any lung sounds due to obesity.  She received 1 dose of IV Lasix 40 mg and I will give her 1 more today.  CT head is negative and carotid Doppler is negative as well.  She needs MRI and unfortunately [facility] does not have the facility over the weekend so she is pending a transfer to [facility].  Once MRI will be done, we will decide whether neurology formal consultation is required.  She was however seen by telemetry neurology yesterday who had recommended holding her Eliquis and placing her on DVT prophylaxis dose of Lovenox so far but patient continued to be on Eliquis, I will make the change per recommendations from neurology.  Patient is already on pravastatin.  She has hyperglycemia and she takes Levemir 65 units which were not given to her last night, continue SSI and continue Levemir.  She does have +1 pitting edema bilateral lower extremity.  Her daughter was at the bedside during the encounter. ?

## 2021-05-17 NOTE — Progress Notes (Signed)
Received patient from [facility]; patient is alert and oriented; oriented to room and unit routine; patient is short of breath; just received a Respir.tx during transport. Continue to monitor; patient is on O2 2l [place].  Reviewed fall safety and bed alarms; patient cooperative. ?

## 2021-05-17 NOTE — Assessment & Plan Note (Addendum)
Patient was admitted to the medical ward and was placed on a remote telemetry monitor.  ?Placed on frequent neuro checks.  ?Her focal neurologic deficit improved.  ? ?Further work up with MRI brain with acute infarct right precentral gyrus, right frontal lobe white matter, and subacute right insula infarct.   ?MRA head with no large vessel occlusion or significant stenosis.   ?Carotid ultrasound with no concerning findings.  Transcranial Doppler ultrasound that was negative with no evidence of right-left intracardiac communication.   ?LDL 166, hemoglobin A1c 8.3.  Etiology likely embolic/ cryptogenic.  ? ?Continue medical therapy with high dose, high potency statin with atorvastatin 80 mg daily, and close blood pressure control.  ?Continue anticoagulation for DVT (bilateral posterior vein thrombus).  ? ?

## 2021-05-17 NOTE — Assessment & Plan Note (Addendum)
TSH 3.43 04/03/2021. ?--Levothyroxine 88 mcg p.o. daily ? ?

## 2021-05-17 NOTE — Consult Note (Signed)
[facility] ?[facility] Tele[facility]urology Consult Services ? ? ?Patient Name:   Stephanie [name], Stephanie [name] ?Date of Birth:   11/22/1950 ?Identification Number:   MRN - [id number] ?Date of Service:   05/17/2021 00:39:52 ? ?Diagnosis: ?      I63.9 - Cerebrovascular accident (CVA), unspecified mechanism ([place]) ? ?Impression: ?     The patient is a 71 year old woman with a history of hypertension, diabetes, DVT on anticoagulation, COPD, who presents with left hand weakness, felt most likely to represent CVA. Not a candidate for acute stroke interventions. Recommend evaluation of vascular risk factors with MRI brain, MRA head/neck, TTE, lipid panel, HgbA1C. Tolerate permissive hypertension. Hold anticoagulation for now, consider restart pending MRI results. OK for ASA 81 mg. ? ?Our recommendations are outlined below. ? ?Recommendations: ? ?      Stroke/Telemetry Floor ?      Neuro Checks ?      Bedside Swallow Eval ?      DVT Prophylaxis ?      IV Fluids, Normal Saline ?      Head of Bed 30 Degrees ?      Euglycemia and Avoid Hyperthermia (PRN Acetaminophen) ?      Hold Anticoagulation for Now ?      Initiate or continue Aspirin 81 MG daily ?      Antihypertensives PRN if Blood pressure is greater than 220/120 or there is a concern for End organ damage/contraindications for permissive HTN. If blood pressure is greater than 220/120 give labetalol PO or IV or Vasotec IV with a goal of 15% reduction in BP during the first 24 hours. ? ?Per facility request will defer further work up, management, and referrals to inpatient service, inclusive of inpatient neurology consult. ? ?Sign Out: ?      Discussed with Emergency Department Provider ? ? ? ?------------------------------------------------------------------------------ ? ?Advanced Imaging: ?Advanced Imaging Not Completed because: ? ?Low clinical suspicion for large vessel occlusion ? ? ?Metrics: ?Last Known Well: 05/16/2021 19:00:00 ?[facility] Notification Time: 05/17/2021  00:39:52 ?Arrival Time: 05/16/2021 22:57:00 ?Stamp Time: 05/17/2021 00:39:52 ?Initial Response Time: 05/17/2021 00:43:02 ?Symptoms: left hand weakness. ?NIHSS Start Assessment Time: 05/17/2021 00:45:34 ?Patient is not a candidate for Thrombolytic. ?Thrombolytic Medical Decision: 05/17/2021 00:45:22 ?Patient was not deemed candidate for Thrombolytic because of following reasons: ?Last Well Known Above 4.5 Hours. ?Use of NOAs within 48 hours. ? ?I personally Reviewed the CT Head and it Showed no acute abnormality ? ?ED Physician notified of diagnostic impression and management plan on 05/17/2021 00:54:03 ? ? ? ?------------------------------------------------------------------------------ ? ?History of Present Illness: ?Patient is a 71 year old Female. ? ?Patient was brought by private transportation with symptoms of left hand weakness. ?Patient presents to hospital with left and weakness. Symptoms started at 7 PM. Reports inability to open or close hand. Denies involvement of face, arm, or leg. No prior similar symptoms. Denies neck or arm pain. In ER, patient has weakness to left hand finger flexion and extension, abduction and adduction. ? ? ?Past Medical History: ?     Hypertension ?     Diabetes Mellitus ?[place] PMH:  DVT, COPD, hypothyroidism ? ?Medications: ? ?Anticoagulant use:  Yes Elaquis ?No Antiplatelet use ?Reviewed EMR for current medications ? ?Allergies:  ?Reviewed ?Description: penicillin ? ?Social History: ?Smoking: No ? ?Family History: ? ?There is no family history of premature cerebrovascular disease pertinent to this consultation ? ?ROS : ?14 Points Review of Systems was performed and was negative except mentioned in HPI. ? ?Past Surgical History: ?There  Is No Surgical History Contributory To Today?s Visit ? ? ? ?Examination: ?BP(168/99), Pulse(103), ?1A: Level of Consciousness - Alert; keenly responsive + 0 ?1B: Ask Month and Age - Both Questions Right + 0 ?1C: Blink Eyes & Squeeze Hands -  Performs Both Tasks + 0 ?2: Test Horizontal Extraocular Movements - Normal + 0 ?3: Test Visual Fields - No Visual Loss + 0 ?4: Test Facial Palsy (Use Grimace if Obtunded) - Normal symmetry + 0 ?5A: Test Left Arm Motor Drift - No Drift for 10 Seconds + 0 ?5B: Test Right Arm Motor Drift - No Drift for 10 Seconds + 0 ?6A: Test Left Leg Motor Drift - No Drift for 5 Seconds + 0 ?6B: Test Right Leg Motor Drift - No Drift for 5 Seconds + 0 ?7: Test Limb Ataxia (FNF/Heel-Shin) - No Ataxia + 0 ?8: Test Sensation - Normal; No sensory loss + 0 ?9: Test Language/Aphasia - Normal; No aphasia + 0 ?10: Test Dysarthria - Normal + 0 ?11: Test Extinction/Inattention - No abnormality + 0 ? ?NIHSS Score: 0 ? ? ?Pre-Morbid Modified Rankin Scale: ?0 Points = No symptoms at all ? ? ?Patient/Family was informed the Neurology Consult would occur via TeleHealth consult by way of interactive audio and video telecommunications and consented to receiving care in this manner. ? ? ?Patient is being evaluated for possible acute neurologic impairment and high probability of imminent or life-threatening deterioration. I spent total of 30 minutes providing care to this patient, including time for face to face visit via telemedicine, review of medical records, imaging studies and discussion of findings with providers, the patient and/or family. ? ? ?Dr Serita Grammes ? ? ?[facility] ?[phone] ? ? ?Case [id number] ? ?

## 2021-05-17 NOTE — Assessment & Plan Note (Addendum)
--  Protonix 40 mg p.o. daily ?

## 2021-05-17 NOTE — ED Notes (Signed)
Pt requesting home meds, tramadol for headache. MD paged.  ?

## 2021-05-17 NOTE — Evaluation (Signed)
Occupational Therapy Evaluation ?Patient Details ?Name: Stephanie [name] ?MRN: [id number] ?DOB: 06-14-1950 ?Today's Date: 05/17/2021 ? ? ?History of Present Illness 71 y.o. female with medical history significant of with history of COPD, DVT on Eliquis, GERD, hyperlipidemia, hypertension, hypothyroidism, type 2 diabetes mellitus, TIA, and more presents to the ED with a chief complaint of dyspnea and L hand weakness.  MRI is pending 4/1.  ? ?Clinical Impression ?  ?Patient admitted for the above diagnosis.  MRI is pending, so OT will continue to follow in the acute setting to ensure a safe transition back home.  OT issued squeeze ball, and red foam to increase grip for L hand during meals and grooming.  Outpatient OT can be considered, depending on need and progress.  Weakness seems to be concentrated to her hand.  She is able to move through full AROM.  Fine motor is slow and purposeful.  She will have the necessary assist at home. SOB is also a deficit, she uses home O2.     ?   ? ?Recommendations for follow up therapy are one component of a multi-disciplinary discharge planning process, led by the attending physician.  Recommendations may be updated based on patient status, additional functional criteria and insurance authorization.  ? ?Follow Up Recommendations ? Outpatient OT  ?  ?Assistance Recommended at Discharge Intermittent Supervision/Assistance  ?Patient can return home with the following   ? ?  ?Functional Status Assessment ? Patient has had a recent decline in their functional status and demonstrates the ability to make significant improvements in function in a reasonable and predictable amount of time.  ?Equipment Recommendations ? Tub/shower seat  ?  ?Recommendations for Other Services   ? ? ?  ?Precautions / Restrictions Precautions ?Precautions: None ?Restrictions ?Weight Bearing Restrictions: No  ? ?  ? ?Mobility Bed Mobility ?Overal bed mobility: Modified Independent ?  ?  ?  ?  ?  ?  ?  ?Patient Response:  Cooperative ? ?Transfers ?Overall transfer level: Needs assistance ?  ?Transfers: Bed to chair/wheelchair/BSC ?  ?  ?  ?Step pivot transfers: Supervision ?  ?  ?  ?  ? ?  ?Balance Overall balance assessment: Mild deficits observed, not formally tested ?  ?  ?  ?  ?  ?  ?  ?  ?  ?  ?  ?  ?  ?  ?  ?  ?  ?  ?   ? ?ADL either performed or assessed with clinical judgement  ? ?ADL   ?  ?  ?Grooming: Oral care;Set up;Standing ?  ?  ?  ?  ?  ?Upper Body Dressing : Supervision/safety;Sitting ?  ?Lower Body Dressing: Minimal assistance;Sit to/from stand ?  ?Toilet Transfer: Supervision/safety;Ambulation;Regular Toilet ?  ?  ?  ?  ?  ?  ?General ADL Comments: increased SOB  ? ? ? ?Vision Patient Visual Report: No change from baseline ?   ?   ?Perception Perception ?Perception: Within Functional Limits ?  ?Praxis Praxis ?Praxis: Intact ?  ? ?Pertinent Vitals/Pain Pain Assessment ?Pain Assessment: No/denies pain  ? ? ? ?Hand Dominance Right ?  ?Extremity/Trunk Assessment Upper Extremity Assessment ?Upper Extremity Assessment: LUE deficits/detail ?LUE Deficits / Details: weak grip, decreased in hand manipulation, able to touch her mouth and the top of her head. ?LUE Sensation: WNL ?LUE Coordination: decreased fine motor ?  ?Lower Extremity Assessment ?Lower Extremity Assessment: Defer to PT evaluation ?  ?Cervical / Trunk Assessment ?Cervical / Trunk Assessment:  Other exceptions ?Cervical / Trunk Exceptions: body habitus ?  ?Communication Communication ?Communication: No difficulties ?  ?Cognition Arousal/Alertness: Awake/alert ?Behavior During Therapy: [facility] for tasks assessed/performed ?Overall Cognitive Status: Within Functional Limits for tasks assessed ?  ?  ?  ?  ?  ?  ?  ?  ?  ?  ?  ?  ?  ?  ?  ?  ?  ?  ?  ?General Comments  will reach for objects in her environment. ? ?  ?Exercises   ?  ?Shoulder Instructions    ? ? ?Home Living Family/patient expects to be discharged to:: Private residence ?Living Arrangements:  Children ?Available Help at Discharge: Family;Available 24 hours/day ?Type of Home: House ?Home Access: Stairs to enter ?Entrance Stairs-Number of Steps: 1 ?Entrance Stairs-Rails: None ?Home Layout: One level ?  ?  ?Bathroom Shower/Tub: Tub/shower unit ?  ?Bathroom Toilet: Handicapped height ?Bathroom Accessibility: Yes ?How Accessible: Accessible via walker ?Home Equipment: Conservation officer, nature (2 wheels);Cane - single point;BSC/3in1;Wheelchair - manual;Grab bars - tub/shower ?  ?  ?  ? ?  ?Prior Functioning/Environment Prior Level of Function : Independent/Modified Independent ?  ?  ?  ?  ?  ?  ?Mobility Comments: uses walker/O2 at home ?ADLs Comments: daughter assists with meal prep, community mobility, medications, home management, and assists with washing her back. ?  ? ?  ?  ?OT Problem List: Decreased strength;Decreased activity tolerance ?  ?   ?OT Treatment/Interventions: Self-care/ADL training;Therapeutic exercise  ?  ?OT Goals(Current goals can be found in the care plan section) Acute Rehab OT Goals ?Patient Stated Goal: Return home when I feel better ?OT Goal Formulation: With patient ?Time For Goal Achievement: 05/31/21 ?Potential to Achieve Goals: Good ?ADL Goals ?Pt Will Perform Grooming: with modified independence;standing ?Pt Will Perform Lower Body Dressing: with modified independence;sit to/from stand ?Pt Will Transfer to Toilet: with modified independence;ambulating;regular height toilet  ?OT Frequency: Min 2X/week ?  ? ?Co-evaluation   ?  ?  ?  ?  ? ?  ?AM-PAC OT "6 Clicks" Daily Activity     ?Outcome Measure Help from another person eating meals?: None ?Help from another person taking care of personal grooming?: A Little ?Help from another person toileting, which includes using toliet, bedpan, or urinal?: A Little ?Help from another person bathing (including washing, rinsing, drying)?: A Little ?Help from another person to put on and taking off regular upper body clothing?: A Little ?Help from  another person to put on and taking off regular lower body clothing?: A Little ?6 Click Score: 19 ?  ?End of Session Equipment Utilized During Treatment: Oxygen ?Nurse Communication: Mobility status ? ?Activity Tolerance: Patient limited by fatigue ?Patient left: in bed;with call bell/phone within reach ? ?OT Visit Diagnosis: Hemiplegia and hemiparesis ?Hemiplegia - Right/Left: Left ?Hemiplegia - dominant/non-dominant: Non-Dominant ?Hemiplegia - caused by: Unspecified  ?              ?Time: 2800-3491 ?OT Time Calculation (min): 21 min ?Charges:  OT General Charges ?$OT Visit: 1 Visit ?OT Evaluation ?$OT Eval Moderate Complexity: 1 Mod ? ?05/17/2021 ? ?RP, OTR/L ? ?Acute Rehabilitation Services ? ?Office:  [phone] ? ? ?Candis Kabel D Mackynzie Woolford ?05/17/2021, 5:01 PM ?

## 2021-05-17 NOTE — Progress Notes (Signed)
SLP Cancellation Note ? ?Patient Details ?Name: Stephanie [name] ?MRN: [id number] ?DOB: [birth year] ? ? ?Cancelled treatment:       Reason Eval/Treat Not Completed: SLP screened, no needs identified, will sign off. Slurred speech has resolved; speech, cognition and language are functioning at baseline. If evaluation is needed for dysphagia please order BSE. Thank you, ? ?Teea Ducey H. Izora Ribas MA, CCC-SLP ?Speech Language Pathologist ? ? ? ?Wende Bushy ?05/17/2021, 10:54 AM ?

## 2021-05-17 NOTE — Progress Notes (Signed)
*  PRELIMINARY RESULTS* ?Echocardiogram ?2D Echocardiogram has been performed. ? ?Stephanie [name] ?05/17/2021, 8:38 AM ?

## 2021-05-17 NOTE — ED Notes (Signed)
Patient ambulating in hallway with PT at this time. Daughter at bedside. Patients linen changed.  ?

## 2021-05-17 NOTE — ED Notes (Signed)
CODE STROKE PAGED ER 7 ?

## 2021-05-17 NOTE — Progress Notes (Signed)
Patient transferred from [facility] for stroke workup. Seen by teleneurology early this AM. Stroke workup has been ordered. Hold eliquis for DVT until stroke ruled out by MRI. Stroke team will f/u tomorrow. ? ?Su Monks, MD ?[facility] ?[phone] ? ?If 7pm- 7am, please page neurology on call as listed in [facility]. ? ?

## 2021-05-17 NOTE — H&P (Signed)
?History and Physical  ? ? ?Patient: Stephanie [name] IWL:[id number] DOB: 08-29-1950 ?DOA: 05/16/2021 ?DOS: the patient was seen and examined on 05/17/2021 ?PCP: [name], FNP  ?Patient coming from: Home ? ?Chief Complaint:  ?Chief Complaint  ?Patient presents with  ? Shortness of Breath  ? ?HPI: Stephanie [name] is a 71 y.o. female with medical history significant of with history of COPD, DVT on Eliquis, GERD, hyperlipidemia, hypertension, hypothyroidism, type 2 diabetes mellitus, TIA, and more presents to the ED with a chief complaint of dyspnea.  Patient reports that she aspirated around 430 or 5 PM.  It was not around mealtime and she seemed to aspirate on her own secretions.  This happens to her pretty regularly.  She started feeling short of breath after that.  Around 6 PM it got acutely worse.  She was coughing, and wheezing.  Her cough is dry.  She has had a dry cough intermittently since she had pneumonia in January, so she is not sure that this is different.  She reports feeling short of breath despite being on her 2 L nasal cannula.  The 2 L nasal cannula is ordered as needed, but she reports she has been wearing it almost constantly due to feeling short of breath.  She denies any fever.  She has used her rescue inhaler 2 times in her nebulizer.  They offered temporary relief but symptoms came back quickly.  Patient denies any chest pain.  She reports she has had palpitations but she has an irregular heartbeats that is normal for her.  Palpitations lasted for 30 minutes today until her breathing got under control.  In the midst of all this, she also noted her left hand weakness.  Left hand weakness started around 7 PM.  It was constant for 1 to 2 hours, and now it is intermittent.  Patient reports that she has never had that before, however she has had a TIA 3 or 4 years ago.  At that time her symptoms were aphasia and ataxia.  Patient reports no numbness in her left hand.  Her daughter at bedside reports she  did have slurred speech earlier in the day as well.  Patient denies any aphasia, ataxia, change in vision, change in hearing, facial droop, weakness in her left lower extremity, numbness anywhere.  Patient denies a headache during this episode, she denies hypertensive symptoms as well.  Patient has no other complaints at this time. ? ?Patient does not smoke, does not drink alcohol, does not use illicit drugs.  She is vaccinated for COVID with out her booster.  She is full code. ?review of Systems: As mentioned in the history of present illness. All other systems reviewed and are negative. ?Past Medical History:  ?Diagnosis Date  ? Anemia   ? Arthritis   ? Asthma   ? COPD (chronic obstructive pulmonary disease) ([place])   ? COVID-19   ? Deep vein thrombosis (DVT) of both lower extremities ([place]) 06/27/2015  ? Fibromyalgia   ? GERD (gastroesophageal reflux disease)   ? H/O hiatal hernia   ? Hypercholesteremia   ? Hypertension   ? Hyperthyroidism   ? IBS (irritable bowel syndrome)   ? Inappropriate sinus tachycardia   ? Inner ear disease   ? 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 Type 2 diabetes mellitus ([place])   ? ?Past Surgical History:  ?Procedure Laterality Date  ? ABDOMINAL HYSTERECTOMY  partial  ? CARPAL TUNNEL RELEASE Right 1991  ? CATARACT  EXTRACTION W/PHACO Right 05/08/2013  ? Procedure: CATARACT EXTRACTION PHACO AND INTRAOCULAR LENS PLACEMENT (IOC);  Surgeon: Tonny Branch, MD;  Location: [facility];  Service: Ophthalmology;  Laterality: Right;  CDE 10.31  ? CATARACT EXTRACTION W/PHACO Left 08/17/2013  ? Procedure: CATARACT EXTRACTION PHACO AND INTRAOCULAR LENS PLACEMENT (IOC);  Surgeon: Tonny Branch, MD;  Location: [facility];  Service: Ophthalmology;  Laterality: Left;  CDE:9.03  ? CHOLECYSTECTOMY  1971  ? COLONOSCOPY WITH PROPOFOL N/A 01/06/2016  ? Dr. Gala Romney: diverticulosis   ? DENTAL SURGERY    ? ESOPHAGEAL BRUSHING  08/29/2019  ? Procedure: ESOPHAGEAL BRUSHING;  Surgeon: Daneil Dolin, MD;   Location: [facility];  Service: Endoscopy;;  ? ESOPHAGOGASTRODUODENOSCOPY (EGD) WITH PROPOFOL N/A 01/06/2016  ? Dr. Gala Romney: normal s/p empiric dilation   ? ESOPHAGOGASTRODUODENOSCOPY (EGD) WITH PROPOFOL N/A 08/29/2019  ? esophageal plaques vs medication residue adherent to tubular esophagus s/p KOH brushing and dilation. Medium-sized hiatal hernia. + for candida. Diflucan.   ? EYE SURGERY    ? MALONEY DILATION N/A 01/06/2016  ? Procedure: MALONEY DILATION;  Surgeon: Daneil Dolin, MD;  Location: [facility];  Service: Endoscopy;  Laterality: N/A;  ? MALONEY DILATION N/A 08/29/2019  ? Procedure: MALONEY DILATION;  Surgeon: Daneil Dolin, MD;  Location: [facility];  Service: Endoscopy;  Laterality: N/A;  ? REVERSE SHOULDER ARTHROPLASTY Right 08/06/2017  ? Procedure: RIGHT REVERSE SHOULDER ARTHROPLASTY;  Surgeon: Netta Cedars, MD;  Location: [facility];  Service: Orthopedics;  Laterality: Right;  ? WRIST GANGLION EXCISION Left   ? ?Social History:  reports that she quit smoking about 10 years ago. Her smoking use included cigarettes. She has a 7.50 pack-year smoking history. She has never used smokeless tobacco. She reports that she does not currently use alcohol. She reports that she does not use drugs. ? ?Allergies  ?Allergen Reactions  ? Tetracyclines & Related Anaphylaxis  ? Banana Hives and Nausea And Vomiting  ? Penicillins Rash and Other (See Comments)  ?  Has patient had a PCN reaction causing immediate rash, facial/tongue/throat swelling, SOB or lightheadedness with hypotension: No ?Has patient had a PCN reaction causing severe rash involving mucus membranes or skin necrosis: No ?Has patient had a PCN reaction that required hospitalization No ?Has patient had a PCN reaction occurring within the last 10 years: No ?If all of the above answers are "NO", then may proceed with Cephalosporin use. ?  ? ? ?Family History  ?Problem Relation Age of Onset  ? Hypertension Mother   ? Diabetes Mother   ? COPD Mother   ?  Arthritis Mother   ? Diabetes Father   ? Arthritis Father   ? Dementia Father   ? CAD Father   ? Hypothyroidism Sister   ? Diabetes Brother   ? Colon cancer Niece   ? Colon polyps Neg Hx   ? ? ?Prior to Admission medications   ?Medication Sig Start Date End Date Taking? Authorizing Provider  ?Accu-Chek FastClix Lancets MISC Apply topically. 03/15/20   [provider]  ?ACCU-CHEK GUIDE test strip 4 (four) times daily. 07/02/20   [provider]  ?acetaminophen (TYLENOL) 500 MG tablet Take 1,000 mg by mouth every 6 (six) hours as needed for moderate pain or headache.    [provider]  ?albuterol (PROVENTIL) (2.5 MG/3ML) 0.083% nebulizer solution Take 2.5 mg by nebulization every 6 (six) hours as needed for wheezing or shortness of breath.    [provider]  ?albuterol (VENTOLIN HFA) 108 (  90 Base) MCG/ACT inhaler Inhale 1-2 puffs into the lungs every 6 (six) hours as needed for wheezing or shortness of breath. 03/21/21   Volney American, PA-C  ?Alcohol Swabs (ALCOHOL PADS) 70 % PADS SMARTSIG:[place](s) Topical 4 Times Daily 08/12/20   [provider]  ?apixaban (ELIQUIS) 5 MG TABS tablet Take 10 mg by mouth BID through Friday 5/19 evening. Take 77m by mouth BID starting 5/20 AM. ?Patient taking differently: Take 5 mg by mouth 2 (two) times daily. 07/25/15   LLendon Colonel NP  ?ARNUITY ELLIPTA 100 MCG/ACT AEPB Inhale 1 puff into the lungs at bedtime.  06/13/17   [provider]  ?Ascorbic Acid (VITAMIN C) 1000 MG tablet Take 1,000 mg by mouth daily.     [provider]  ?bisoprolol (ZEBETA) 10 MG tablet Take 1 tablet (10 mg total) by mouth in the morning and at bedtime. 01/02/21   SErma Heritage PA-C  ?Blood Glucose Calibration (ACCU-CHEK GUIDE CONTROL) LIQD See admin instructions. 07/02/20   [provider]  ?Blood Glucose Monitoring Suppl (ACCU-CHEK GUIDE ME) w/Device KIT 4 (four) times daily. 07/02/20   [provider]   ?cetirizine (ZYRTEC) 10 MG tablet Take 10 mg by mouth daily.    [provider]  ?Cholecalciferol (VITAMIN D) 2000 units tablet Take 4,000 Units by mouth daily.     [provider]  ?cimetidine

## 2021-05-17 NOTE — ED Notes (Signed)
Pt transported to CT on monitor with this nurse- pt able to move self from ED stretcher to CT table using all upper and lower extremities.  ?

## 2021-05-17 NOTE — Evaluation (Signed)
Physical Therapy Evaluation ?Patient Details ?Name: Stephanie [name] ?MRN: [id number] ?DOB: 1950-10-23 ?Today's Date: 05/17/2021 ? ?History of Present Illness ? PT comes to ER complaining of SOB on 2L O2, has multiple complaintesmone of which is left hand weakness.  ?Clinical Impression ? PT is not in need of PT services at this time.     ?   ? ?Recommendations for follow up therapy are one component of a multi-disciplinary discharge planning process, led by the attending physician.  Recommendations may be updated based on patient status, additional functional criteria and insurance authorization. ? ?Follow Up Recommendations  none ? ?  ?Assistance Recommended at Discharge  Intermittent   ?Patient can return home with the following ?  PT has all needs  ? ?  ?Equipment Recommendations  PT has RW and O2  ?Recommendations for Other Services ?  (none)  ?  ?Functional Status Assessment  PT is at prior level   ? ?  ?Precautions / Restrictions Precautions ?Precautions: None ?Restrictions ?Weight Bearing Restrictions: No  ? ?  ? ?Mobility ? Bed Mobility ?Overal bed mobility: Modified Independent ?  ?  ?  ?  ?  ?  ?  ?  ? ?Transfers ?Overall transfer level: Modified independent ?  ?  ?  ?  ?  ?  ?  ?  ?  ?  ? ?Ambulation/Gait ?Ambulation/Gait assistance: Modified independent (Device/Increase time) ?Gait Distance (Feet): 80 Feet ?Assistive device: Rolling walker (2 wheels) ?  ?Gait velocity: decreased ?  ?  ?  ? ?Stairs ?  ?  ?  ?  ?  ? ?Wheelchair Mobility ?  ? ?Modified Rankin (Stroke Patients Only) ?  ? ?  ? ? ? ? ? ? ?Home Living Family/patient expects to be discharged to:: Private residence ?Living Arrangements: Children ?Available Help at Discharge: Family ?Type of Home: House ?Home Access: Other (comment) (1 step) ?  ?  ?  ?  ?  ?   ?  ?Prior Function Prior Level of Function : Independent/Modified Independent ?  ?  ?  ?  ?  ?  ?Mobility Comments: uses walker/O2 at home ?  ?  ? ? ?Hand Dominance  ?   ? ?  ?Extremity/Trunk  Assessment  ? Upper Extremity Assessment ?Upper Extremity Assessment: Overall WFL for tasks assessed (PT states she was not able to open and close her Lt hand, able to complete now but slow and difficult.  Therapist recommended OP OT if problem persists.) ?  ? ?Lower Extremity Assessment ?Lower Extremity Assessment: Overall WFL for tasks assessed ?  ? ?   ?Communication  ? Communication: No difficulties  ?Cognition Arousal/Alertness: Awake/alert ?  ?  ?  ?  ?  ?  ?  ?  ?  ?  ?  ?  ?  ?  ?  ?  ?  ?  ?  ?  ?  ? ?  ? ?AM-PAC PT "6 Clicks" Mobility  ?Outcome Measure Help needed turning from your back to your side while in a flat bed without using bedrails?: None ?Help needed moving from lying on your back to sitting on the side of a flat bed without using bedrails?: None ?Help needed moving to and from a bed to a chair (including a wheelchair)?: None ?Help needed standing up from a chair using your arms (e.g., wheelchair or bedside chair)?: None ?Help needed to walk in hospital room?: None ?Help needed climbing 3-5 steps with a railing? : A Little ?  6 Click Score: 23 ? ?  ?End of Session   ?Activity Tolerance: Patient limited by pain (back pain once we were walking states this is normal) ?  ?  ?  ?  ? ?Time: 1040-1106 ?PT Time Calculation (min) (ACUTE ONLY): 26 min ? ? ?Charges:   PT Evaluation ?$PT Eval Low Complexity: 1 Low ?  ?  ?   ? ? ?Rayetta Humphrey, PT CLT ?[phone]  ?05/17/2021, 11:37 AM ? ?

## 2021-05-17 NOTE — Assessment & Plan Note (Addendum)
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   ? ?Patient was placed on insulin therapy during her hospitalization.  ?Her glucose remained sable.  ? ?At home patient on Jardiance 10 mg p.o. daily, Humalog 50 units with breakfast and dinner, 30 units with lunch, and Xultophy 85u [place] qHS.  ? ?Will need close follow up.  ?

## 2021-05-18 DIAGNOSIS — I639 Cerebral infarction, unspecified: Secondary | ICD-10-CM | POA: Diagnosis present

## 2021-05-18 DIAGNOSIS — E440 Moderate protein-calorie malnutrition: Secondary | ICD-10-CM | POA: Diagnosis present

## 2021-05-18 DIAGNOSIS — R29700 NIHSS score 0: Secondary | ICD-10-CM | POA: Diagnosis present

## 2021-05-18 DIAGNOSIS — Z20822 Contact with and (suspected) exposure to covid-19: Secondary | ICD-10-CM | POA: Diagnosis present

## 2021-05-18 DIAGNOSIS — I679 Cerebrovascular disease, unspecified: Secondary | ICD-10-CM

## 2021-05-18 DIAGNOSIS — G4733 Obstructive sleep apnea (adult) (pediatric): Secondary | ICD-10-CM | POA: Diagnosis present

## 2021-05-18 DIAGNOSIS — M79605 Pain in left leg: Secondary | ICD-10-CM | POA: Diagnosis not present

## 2021-05-18 DIAGNOSIS — J441 Chronic obstructive pulmonary disease with (acute) exacerbation: Secondary | ICD-10-CM | POA: Diagnosis present

## 2021-05-18 DIAGNOSIS — I10 Essential (primary) hypertension: Secondary | ICD-10-CM | POA: Diagnosis not present

## 2021-05-18 DIAGNOSIS — M797 Fibromyalgia: Secondary | ICD-10-CM | POA: Diagnosis present

## 2021-05-18 DIAGNOSIS — K219 Gastro-esophageal reflux disease without esophagitis: Secondary | ICD-10-CM | POA: Diagnosis present

## 2021-05-18 DIAGNOSIS — R4701 Aphasia: Secondary | ICD-10-CM | POA: Diagnosis present

## 2021-05-18 DIAGNOSIS — M79604 Pain in right leg: Secondary | ICD-10-CM | POA: Diagnosis not present

## 2021-05-18 DIAGNOSIS — Z7901 Long term (current) use of anticoagulants: Secondary | ICD-10-CM | POA: Diagnosis not present

## 2021-05-18 DIAGNOSIS — R4781 Slurred speech: Secondary | ICD-10-CM | POA: Diagnosis present

## 2021-05-18 DIAGNOSIS — E7800 Pure hypercholesterolemia, unspecified: Secondary | ICD-10-CM | POA: Diagnosis present

## 2021-05-18 DIAGNOSIS — M6281 Muscle weakness (generalized): Secondary | ICD-10-CM | POA: Diagnosis present

## 2021-05-18 DIAGNOSIS — E876 Hypokalemia: Secondary | ICD-10-CM | POA: Diagnosis present

## 2021-05-18 DIAGNOSIS — Z6836 Body mass index (BMI) 36.0-36.9, adult: Secondary | ICD-10-CM | POA: Diagnosis not present

## 2021-05-18 DIAGNOSIS — E039 Hypothyroidism, unspecified: Secondary | ICD-10-CM | POA: Diagnosis present

## 2021-05-18 DIAGNOSIS — E1165 Type 2 diabetes mellitus with hyperglycemia: Secondary | ICD-10-CM | POA: Diagnosis present

## 2021-05-18 DIAGNOSIS — J449 Chronic obstructive pulmonary disease, unspecified: Secondary | ICD-10-CM | POA: Diagnosis present

## 2021-05-18 DIAGNOSIS — I110 Hypertensive heart disease with heart failure: Secondary | ICD-10-CM | POA: Diagnosis present

## 2021-05-18 DIAGNOSIS — E119 Type 2 diabetes mellitus without complications: Secondary | ICD-10-CM | POA: Diagnosis present

## 2021-05-18 DIAGNOSIS — I6340 Cerebral infarction due to embolism of unspecified cerebral artery: Secondary | ICD-10-CM | POA: Diagnosis present

## 2021-05-18 DIAGNOSIS — R29898 Other symptoms and signs involving the musculoskeletal system: Secondary | ICD-10-CM | POA: Diagnosis not present

## 2021-05-18 DIAGNOSIS — F0390 Unspecified dementia without behavioral disturbance: Secondary | ICD-10-CM | POA: Diagnosis present

## 2021-05-18 DIAGNOSIS — R270 Ataxia, unspecified: Secondary | ICD-10-CM | POA: Diagnosis present

## 2021-05-18 DIAGNOSIS — I5043 Acute on chronic combined systolic (congestive) and diastolic (congestive) heart failure: Secondary | ICD-10-CM | POA: Diagnosis present

## 2021-05-18 DIAGNOSIS — M1990 Unspecified osteoarthritis, unspecified site: Secondary | ICD-10-CM | POA: Diagnosis present

## 2021-05-18 LAB — CBC WITH DIFFERENTIAL/PLATELET
Abs Immature Granulocytes: 0.04 10*3/uL (ref 0.00–0.07)
Basophils Absolute: 0.1 10*3/uL (ref 0.0–0.1)
Basophils Relative: 1 %
Eosinophils Absolute: 0.4 10*3/uL (ref 0.0–0.5)
Eosinophils Relative: 4 %
HCT: 40.9 % (ref 36.0–46.0)
Hemoglobin: 13 g/dL (ref 12.0–15.0)
Immature Granulocytes: 0 %
Lymphocytes Relative: 20 %
Lymphs Abs: 1.8 10*3/uL (ref 0.7–4.0)
MCH: 26.9 pg (ref 26.0–34.0)
MCHC: 31.8 g/dL (ref 30.0–36.0)
MCV: 84.7 fL (ref 80.0–100.0)
Monocytes Absolute: 0.8 10*3/uL (ref 0.1–1.0)
Monocytes Relative: 8 %
Neutro Abs: 6.2 10*3/uL (ref 1.7–7.7)
Neutrophils Relative %: 67 %
Platelets: 244 10*3/uL (ref 150–400)
RBC: 4.83 MIL/uL (ref 3.87–5.11)
RDW: 13.6 % (ref 11.5–15.5)
WBC: 9.2 10*3/uL (ref 4.0–10.5)
nRBC: 0 % (ref 0.0–0.2)

## 2021-05-18 LAB — GLUCOSE, CAPILLARY
Glucose-Capillary: 127 mg/dL — ABNORMAL HIGH (ref 70–99)
Glucose-Capillary: 113 mg/dL — ABNORMAL HIGH (ref 70–99)
Glucose-Capillary: 86 mg/dL (ref 70–99)
Glucose-Capillary: 148 mg/dL — ABNORMAL HIGH (ref 70–99)
Glucose-Capillary: 129 mg/dL — ABNORMAL HIGH (ref 70–99)

## 2021-05-18 LAB — BASIC METABOLIC PANEL
Anion gap: 7 (ref 5–15)
BUN: 13 mg/dL (ref 8–23)
CO2: 26 mmol/L (ref 22–32)
Calcium: 8.9 mg/dL (ref 8.9–10.3)
Chloride: 105 mmol/L (ref 98–111)
Creatinine, Ser: 1.12 mg/dL — ABNORMAL HIGH (ref 0.44–1.00)
GFR, Estimated: 53 mL/min — ABNORMAL LOW (ref >60)
Glucose, Bld: 64 mg/dL — ABNORMAL LOW (ref 70–99)
Potassium: 3.4 mmol/L — ABNORMAL LOW (ref 3.5–5.1)
Sodium: 138 mmol/L (ref 135–145)

## 2021-05-18 MED ORDER — POTASSIUM CHLORIDE CRYS ER 20 MEQ PO TBCR
40.0000 meq | EXTENDED_RELEASE_TABLET | Freq: Once | ORAL | Status: AC
  Administered 2021-05-18: 40 meq via ORAL
  Filled 2021-05-18: qty 2

## 2021-05-18 MED ORDER — FUROSEMIDE 10 MG/ML IJ SOLN
40.0000 mg | Freq: Four times a day (QID) | INTRAMUSCULAR | Status: AC
  Administered 2021-05-18 (×2): 40 mg via INTRAVENOUS
  Filled 2021-05-18 (×2): qty 4

## 2021-05-18 MED ORDER — PRAVASTATIN SODIUM 40 MG PO TABS: 80.0000 mg | ORAL_TABLET | Freq: Every day | ORAL | Status: DC

## 2021-05-18 MED ORDER — ATORVASTATIN CALCIUM 80 MG PO TABS
80.0000 mg | ORAL_TABLET | Freq: Every day | ORAL | Status: DC
  Administered 2021-05-19 – 2021-05-21 (×3): 80 mg via ORAL
  Filled 2021-05-18 (×3): qty 1

## 2021-05-18 NOTE — Progress Notes (Signed)
STROKE TEAM PROGRESS NOTE  ? ?INTERVAL HISTORY ?Patient is seen in the room with no family at bedside. She complains of L hand weakness. She reports that her daughter feels her speech is still somewhat slurred. When asked about her compliance with Eliquis she reports that she misses doses.  ? 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 Additional infarct involving the right insula may be subacute. ? ?Vitals:  ? 05/18/21 0511 05/18/21 0730 05/18/21 0852 05/18/21 1235  ?BP: 125/66  (!) 157/88 (!) 143/76  ?Pulse: 89  92 98  ?Resp: 20  20 18   ?Temp: 98.3 ?F (36.8 ?C)  97.6 ?F (36.4 ?C) 98.6 ?F (37 ?C)  ?TempSrc: Oral  Oral Oral  ?SpO2: 99% 98% 100% 98%  ?Weight:      ?Height:      ? ?CBC:  ?Recent Labs  ?Lab 05/17/21 ?0023 05/17/21 ?9983 05/18/21 ?3825  ?WBC 10.4 11.4* 9.2  ?NEUTROABS 7.9*  --  6.2  ?HGB 12.4 12.6 13.0  ?HCT 42.1 40.2 40.9  ?MCV 88.1 88.0 84.7  ?PLT 237 234 244  ? ?Basic Metabolic Panel:  ?Recent Labs  ?Lab 05/17/21 ?0629 05/18/21 ?0956  ?NA 137 138  ?K 3.7 3.4*  ?CL 106 105  ?CO2 23 26  ?GLUCOSE 178* 64*  ?BUN 17 13  ?CREATININE 0.98 1.12*  ?CALCIUM 8.8* 8.9  ? ?Lipid Panel:  ?Recent Labs  ?Lab 05/17/21 ?0539  ?CHOL 254*  ?TRIG 67  ?HDL 75  ?CHOLHDL 3.4  ?VLDL 13  ?[place] 166*  ? ?HgbA1c:  ?Recent Labs  ?Lab 05/17/21 ?7673  ?HGBA1C 8.3*  ? ?Urine Drug Screen:  ?Recent Labs  ?Lab 05/17/21 ?4193  ?LABOPIA NONE DETECTED  ?COCAINSCRNUR NONE DETECTED  ?LABBENZ NONE DETECTED  ?AMPHETMU NONE DETECTED  ?THCU NONE DETECTED  ?LABBARB NONE DETECTED  ?  ?Alcohol Level  ?Recent Labs  ?Lab 05/17/21 ?0023  ?ETH <10  ? ? ?IMAGING past 24 hours ?MR ANGIO HEAD WO CONTRAST ? ?Result Date: 05/17/2021 ?CLINICAL DATA:  Shortness of breath, left hand weakness, stroke suspected EXAM: MRI HEAD WITHOUT CONTRAST MRA HEAD WITHOUT CONTRAST TECHNIQUE: Multiplanar, multi-echo pulse sequences of the brain and surrounding structures were  acquired without intravenous contrast. Angiographic images of the Circle of Willis were acquired using MRA technique without intravenous contrast. COMPARISON:  09/15/2017 MRI, correlation is also made with 05/17/2021 CT head FINDINGS: Evaluation is somewhat limited by motion artifact. MRI HEAD FINDINGS Brain: Restricted diffusion with ADC correlate in the right precentral gyrus (series 5, images 83-87 and series 6, images 33-37), including restricted diffusion involving the "hand" knob. Additional restricted diffusion with ADC correlate in the right frontal lobe white matter (series 5, images 79-80 and series 8, images 20 9-30). An additional focus of increased signal on diffusion-weighted imaging in the right insula (series 5, image 73) is associated with mildly decreased signal on ADC map, possibly reflecting some normalization (series 6, image 23). These areas are associated with increased T2 hyperintense signal. No acute hemorrhage, mass, mass effect, or midline shift. No hydrocephalus or extra-axial collection. Scattered T2 hyperintense signal in the periventricular white matter, likely the sequela of mild chronic small vessel ischemic disease. Vascular: Normal flow voids. Skull and upper cervical spine: Normal marrow signal. Sinuses/Orbits: No acute or significant finding. Status post bilateral lens replacements. Other: The mastoids are well aerated. MRA HEAD FINDINGS Evaluation is limited by motion artifact. Anterior circulation: Both internal carotid arteries are  patent to the termini, without significant stenosis. A1 segments patent. Normal anterior communicating artery. Anterior cerebral arteries are patent to their distal aspects. Possible mild narrowing at the right M1 origin (series 5, image 100), although this is favored to represent motion artifact. No left M1 stenosis or occlusion. Normal MCA bifurcations. Distal MCA branches perfused and symmetric. Posterior circulation: Vertebral arteries patent to  the vertebrobasilar junction without stenosis. Basilar patent to its distal aspect. Superior cerebellar arteries patent bilaterally. Patent P1 segments. PCAs perfused to their distal aspects without stenosis. The bilateral posterior communicating arteries are not visualized. Anatomic variants: None significant IMPRESSION: 1. Evaluation is limited by motion artifact. 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 Additional infarct involving the right insula may be subacute, given some normalization of the ADC. 2. Evaluation is significantly limited by motion artifact. Within this limitation, no intracranial large vessel occlusion or significant stenosis. These results will be called to the ordering clinician or representative by the Radiologist Assistant, and communication documented in the PACS or [employer]. Electronically Signed   By: Merilyn Baba M.D.   On: 05/17/2021 18:22  ? ?MR BRAIN WO CONTRAST ? ?Result Date: 05/17/2021 ?CLINICAL DATA:  Shortness of breath, left hand weakness, stroke suspected EXAM: MRI HEAD WITHOUT CONTRAST MRA HEAD WITHOUT CONTRAST TECHNIQUE: Multiplanar, multi-echo pulse sequences of the brain and surrounding structures were acquired without intravenous contrast. Angiographic images of the Circle of Willis were acquired using MRA technique without intravenous contrast. COMPARISON:  09/15/2017 MRI, correlation is also made with 05/17/2021 CT head FINDINGS: Evaluation is somewhat limited by motion artifact. MRI HEAD FINDINGS Brain: Restricted diffusion with ADC correlate in the right precentral gyrus (series 5, images 83-87 and series 6, images 33-37), including restricted diffusion involving the "hand" knob. Additional restricted diffusion with ADC correlate in the right frontal lobe white matter (series 5, images 79-80 and series 8, images 20 9-30). An additional focus of  increased signal on diffusion-weighted imaging in the right insula (series 5, image 73) is associated with mildly decreased signal on ADC map, possibly reflecting some normalization (series 6, image 23). These areas are associated with increased T2 hyperintense signal. No acute hemorrhage, mass, mass effect, or midline shift. No hydrocephalus or extra-axial collection. Scattered T2 hyperintense signal in the periventricular white matter, likely the sequela of mild chronic small vessel ischemic disease. Vascular: Normal flow voids. Skull and upper cervical spine: Normal marrow signal. Sinuses/Orbits: No acute or significant finding. Status post bilateral lens replacements. Other: The mastoids are well aerated. MRA HEAD FINDINGS Evaluation is limited by motion artifact. Anterior circulation: Both internal carotid arteries are patent to the termini, without significant stenosis. A1 segments patent. Normal anterior communicating artery. Anterior cerebral arteries are patent to their distal aspects. Possible mild narrowing at the right M1 origin (series 5, image 100), although this is favored to represent motion artifact. No left M1 stenosis or occlusion. Normal MCA bifurcations. Distal MCA branches perfused and symmetric. Posterior circulation: Vertebral arteries patent to the vertebrobasilar junction without stenosis. Basilar patent to its distal aspect. Superior cerebellar arteries patent bilaterally. Patent P1 segments. PCAs perfused to their distal aspects without stenosis. The bilateral posterior communicating arteries are not visualized. Anatomic variants: None significant IMPRESSION: 1. Evaluation is limited by motion artifact. 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 Additional infarct involving the right  insula may be subacute, given some normalization of the ADC. 2. Evaluation is significantly limited  by motion artifact. Within this limitation, no intracranial large vessel occlusion or significant stenosis. These results will be called to the ordering clinician or representative by the Radiologist Assistant, a

## 2021-05-18 NOTE — Progress Notes (Addendum)
?PROGRESS NOTE ? ? ? ?Stephanie [name]  HTD:[id number] DOB: 1950-09-01 DOA: 05/16/2021 ?PCP: [name], FNP ? ? ?Brief Narrative:  ?This is a 71 year old female with medical history significant of with history of COPD, DVT on Eliquis, GERD, hyperlipidemia, hypertension, hypothyroidism, type 2 diabetes mellitus, TIA who presented to ED with the complaint of shortness of breath and left hand numbness and weakness.  Chest x-ray showed possible pulmonary edema.  BNP also significantly elevated from her baseline, presented with 1488 while it was only 59 about a year ago.  Echo now shows slightly reduced ejection fraction of 45 to 50% as opposed to 50 to 55% last year.  She appears to be having acute on chronic combined systolic and diastolic congestive heart failure.  She received Lasix IV 40 mg. CT head is negative and carotid Doppler is negative as well. She was seen by telemetry neurology who had recommended holding her Eliquis and placing her on DVT prophylaxis dose of Lovenox so far but patient continued to be on Eliquis. She was transferred to [facility] for further work-up including MRI. ? ?Assessment & Plan: ?  ?Principal Problem: ?  Left hand weakness ?Active Problems: ?  Hypertension ?  GERD (gastroesophageal reflux disease) ?  COPD (chronic obstructive pulmonary disease) ([place]) ?  Diabetes ([place]) ?  Hypothyroidism ?  Protein-calorie malnutrition, moderate ([place]) ?  CVA (cerebral vascular accident) [facility]) ? ? ?Acute on chronic combined systolic and diastolic congestive heart failure: Although patient is not using any more oxygen than she normally does but she has bilateral lower extremity and she feels short of breath.  I will give her 2 doses of IV Lasix 40 mg today.  Strict I's and O's with daily weight. ? ?Acute ischemic stroke/Left hand weakness: MRI shows multiple infarcts involving the right precentral gyrus and right frontal lobe.  Neurology on board.  MR angiogram of head is negative for any large vessel  obstruction.  Plan is to get her to be seen by PT OT and perform bubble study tomorrow morning and Doppler lower extremity today.  Appreciate neurology help. ? ?Hypothyroidism: Continue Synthroid. ? ?Hypokalemia: Replace. ? ?Type 2 diabetes mellitus: Blood sugar now better.  Continue long-acting insulin 65 units at night and SSI ? ?COPD: Not in exacerbation.  Stable.  Continue breathing treatments as needed. ? ?GERD: Continue PPI. ? ?Essential hypertension: Blood pressure controlled while her antihypertensives are still on hold. ? ?Hyperlipidemia: Continue pravastatin. ? ?DVT prophylaxis: SCD's Start: 05/17/21 0341 ?  Code Status: Full Code  ?Family Communication:  None present at bedside.  Plan of care discussed with patient in length and he/she verbalized understanding and agreed with it. ? ?Status is: Inpatient ?Remains inpatient appropriate because: Needs further stroke work-up ? ? ?Estimated body mass index is 36.75 kg/m? as calculated from the following: ?  Height as of this encounter: 5' 8"  (1.727 m). ?  Weight as of this encounter: 109.6 kg. ? ?  ?Nutritional Assessment: ?Body mass index is 36.75 kg/m?Marland KitchenMarland Kitchen ?Seen by dietician.  I agree with the assessment and plan as outlined below: ?Nutrition Status: ?  ?  ?  ? ?. ?Skin Assessment: ?I have examined the patient's skin and I agree with the wound assessment as performed by the wound care RN as outlined below: ?  ? ?Consultants:  ?Neurology ? ?Procedures:  ?None ? ?Antimicrobials:  ?Anti-infectives (From admission, onward)  ? ? None  ? ?  ?  ? ? ?Subjective: ?Patient seen and examined.  She  complains of shortness of breath with exertion and also complains of left hand weakness.  No other complaint.  Patient was requesting that she should be informed at least 24 hours before discharge plan.  I tried to explain to the patient that it is not always possible for [place] to be able to do that as I am not sure what time her bubble study will be done tomorrow and what we will  find.  I did inform her that she will potentially be discharged tomorrow if those studies are negative and done on time tomorrow. ? ?Objective: ?Vitals:  ? 05/18/21 0511 05/18/21 0730 05/18/21 0852 05/18/21 1235  ?BP: 125/66  (!) 157/88 (!) 143/76  ?Pulse: 89  92 98  ?Resp: 20  20 18   ?Temp: 98.3 ?F (36.8 ?C)  97.6 ?F (36.4 ?C) 98.6 ?F (37 ?C)  ?TempSrc: Oral  Oral Oral  ?SpO2: 99% 98% 100% 98%  ?Weight:      ?Height:      ? ? ?Intake/Output Summary (Last 24 hours) at 05/18/2021 1323 ?Last data filed at 05/18/2021 1242 ?Gross per 24 hour  ?Intake 444 ml  ?Output --  ?Net 444 ml  ? ?Filed Weights  ? 05/16/21 2304 05/17/21 1610  ?Weight: 101.6 kg 109.6 kg  ? ? ?Examination: ? ?General exam: Appears calm and comfortable, obese ?Respiratory system: Diminished breath sounds bilaterally. Respiratory effort normal. ?Cardiovascular system: S1 & S2 heard, RRR. No JVD, murmurs, rubs, gallops or clicks.  +1 pitting edema bilateral lower extremity ?Gastrointestinal system: Abdomen is nondistended, soft and nontender. No organomegaly or masses felt. Normal bowel sounds heard. ?Central nervous system: Alert and oriented.  4/5 power in left upper extremity. ?Extremities: Symmetric 5 x 5 power. ?Skin: No rashes, lesions or ulcers ?Psychiatry: Judgement and insight appear normal. Mood & affect appropriate.  ? ? ?Data Reviewed: I have personally reviewed following labs and imaging studies ? ?CBC: ?Recent Labs  ?Lab 05/12/21 ?1327 05/17/21 ?0023 05/17/21 ?1610 05/18/21 ?9604  ?WBC 7.9 10.4 11.4* 9.2  ?NEUTROABS 5.2 7.9*  --  6.2  ?HGB 12.3 12.4 12.6 13.0  ?HCT 40.1 42.1 40.2 40.9  ?MCV 88.9 88.1 88.0 84.7  ?PLT 231 237 234 244  ? ?Basic Metabolic Panel: ?Recent Labs  ?Lab 05/12/21 ?1327 05/17/21 ?0023 05/17/21 ?5409 05/18/21 ?8119  ?NA 136 135 137 138  ?K 3.9 3.5 3.7 3.4*  ?CL 104 106 106 105  ?CO2 26 23 23 26   ?GLUCOSE 331* 152* 178* 64*  ?BUN 17 19 17 13   ?CREATININE 1.17* 1.19* 0.98 1.12*  ?CALCIUM 8.5* 8.6* 8.8* 8.9   ? ?GFR: ?Estimated Creatinine Clearance: 60.7 mL/min (A) (by C-G formula based on SCr of 1.12 mg/dL (H)). ?Liver Function Tests: ?Recent Labs  ?Lab 05/12/21 ?1327 05/17/21 ?0023 05/17/21 ?1478  ?AST 37 30 27  ?ALT 36 35 33  ?ALKPHOS 373* 351* 350*  ?BILITOT 0.6 0.7 0.8  ?PROT 7.4 7.7 7.6  ?ALBUMIN 2.5* 2.6* 2.6*  ? ?No results for input(s): LIPASE, AMYLASE in the last 168 hours. ?No results for input(s): AMMONIA in the last 168 hours. ?Coagulation Profile: ?Recent Labs  ?Lab 05/17/21 ?0023  ?INR 1.1  ? ?Cardiac Enzymes: ?No results for input(s): CKTOTAL, CKMB, CKMBINDEX, TROPONINI in the last 168 hours. ?BNP (last 3 results) ?No results for input(s): PROBNP in the last 8760 hours. ?HbA1C: ?Recent Labs  ?  05/17/21 ?0629  ?HGBA1C 8.3*  ? ?CBG: ?Recent Labs  ?Lab 05/17/21 ?1238 05/17/21 ?1617 05/17/21 ?2137 05/18/21 ?0615 05/18/21 ?1240  ?  GLUCAP 130* 115* 235* 127* 113*  ? ?Lipid Profile: ?Recent Labs  ?  05/17/21 ?0629  ?CHOL 254*  ?HDL 75  ?LDLCALC 166*  ?TRIG 67  ?CHOLHDL 3.4  ? ?Thyroid Function Tests: ?No results for input(s): TSH, T4TOTAL, FREET4, T3FREE, THYROIDAB in the last 72 hours. ?Anemia Panel: ?No results for input(s): VITAMINB12, FOLATE, FERRITIN, TIBC, IRON, RETICCTPCT in the last 72 hours. ?Sepsis Labs: ?No results for input(s): PROCALCITON, LATICACIDVEN in the last 168 hours. ? ?Recent Results (from the past 240 hour(s))  ?Resp Panel by RT-PCR (Flu A&B, Covid) Nasopharyngeal Swab     Status: None  ? Collection Time: 05/17/21 12:21 AM  ? Specimen: Nasopharyngeal Swab; Nasopharyngeal(NP) swabs in vial transport medium  ?Result Value Ref Range Status  ? SARS Coronavirus 2 by RT PCR NEGATIVE NEGATIVE Final  ?  Comment: (NOTE) ?SARS-CoV-2 target nucleic acids are NOT DETECTED. ? ?The SARS-CoV-2 RNA is generally detectable in upper respiratory ?specimens during the acute phase of infection. The lowest ?concentration of SARS-CoV-2 viral copies this assay can detect is ?138 copies/mL. A negative result does  not preclude SARS-Cov-2 ?infection and should not be used as the sole basis for treatment or ?other patient management decisions. A negative result may occur with  ?improper specimen collection/handling, submission of

## 2021-05-19 ENCOUNTER — Ambulatory Visit (HOSPITAL_COMMUNITY): Payer: Medicare Other | Admitting: Physician Assistant

## 2021-05-19 ENCOUNTER — Inpatient Hospital Stay (HOSPITAL_COMMUNITY): Payer: Medicare Other

## 2021-05-19 DIAGNOSIS — M79605 Pain in left leg: Secondary | ICD-10-CM

## 2021-05-19 DIAGNOSIS — R29898 Other symptoms and signs involving the musculoskeletal system: Secondary | ICD-10-CM | POA: Diagnosis not present

## 2021-05-19 DIAGNOSIS — M79604 Pain in right leg: Secondary | ICD-10-CM

## 2021-05-19 DIAGNOSIS — J441 Chronic obstructive pulmonary disease with (acute) exacerbation: Secondary | ICD-10-CM

## 2021-05-19 DIAGNOSIS — I639 Cerebral infarction, unspecified: Secondary | ICD-10-CM

## 2021-05-19 LAB — GLUCOSE, CAPILLARY
Glucose-Capillary: 114 mg/dL — ABNORMAL HIGH (ref 70–99)
Glucose-Capillary: 101 mg/dL — ABNORMAL HIGH (ref 70–99)
Glucose-Capillary: 64 mg/dL — ABNORMAL LOW (ref 70–99)
Glucose-Capillary: 79 mg/dL (ref 70–99)
Glucose-Capillary: 85 mg/dL (ref 70–99)
Glucose-Capillary: 67 mg/dL — ABNORMAL LOW (ref 70–99)
Glucose-Capillary: 86 mg/dL (ref 70–99)
Glucose-Capillary: 141 mg/dL — ABNORMAL HIGH (ref 70–99)

## 2021-05-19 LAB — BASIC METABOLIC PANEL
Anion gap: 7 (ref 5–15)
BUN: 13 mg/dL (ref 8–23)
CO2: 28 mmol/L (ref 22–32)
Calcium: 9.1 mg/dL (ref 8.9–10.3)
Chloride: 105 mmol/L (ref 98–111)
Creatinine, Ser: 1.05 mg/dL — ABNORMAL HIGH (ref 0.44–1.00)
GFR, Estimated: 57 mL/min — ABNORMAL LOW (ref >60)
Glucose, Bld: 83 mg/dL (ref 70–99)
Potassium: 3.7 mmol/L (ref 3.5–5.1)
Sodium: 140 mmol/L (ref 135–145)

## 2021-05-19 MED ORDER — APIXABAN 5 MG PO TABS
5.0000 mg | ORAL_TABLET | Freq: Two times a day (BID) | ORAL | Status: DC
  Administered 2021-05-19 – 2021-05-21 (×4): 5 mg via ORAL
  Filled 2021-05-19 (×4): qty 1

## 2021-05-19 MED ORDER — APIXABAN 5 MG PO TABS: 5.0000 mg | ORAL_TABLET | Freq: Two times a day (BID) | ORAL | Status: DC

## 2021-05-19 MED ORDER — TORSEMIDE 20 MG PO TABS
40.0000 mg | ORAL_TABLET | Freq: Every day | ORAL | Status: DC
  Administered 2021-05-19 – 2021-05-21 (×3): 40 mg via ORAL
  Filled 2021-05-19 (×3): qty 2

## 2021-05-19 MED ORDER — INSULIN DETEMIR 100 UNIT/ML ~~LOC~~ SOLN
20.0000 [IU] | Freq: Every day | SUBCUTANEOUS | Status: DC
  Administered 2021-05-19 – 2021-05-20 (×2): 20 [IU] via SUBCUTANEOUS
  Filled 2021-05-19 (×3): qty 0.2

## 2021-05-19 MED ORDER — GUAIFENESIN-DM 100-10 MG/5ML PO SYRP
5.0000 mL | ORAL_SOLUTION | Freq: Once | ORAL | Status: AC
  Administered 2021-05-19: 5 mL via ORAL
  Filled 2021-05-19: qty 5

## 2021-05-19 NOTE — Progress Notes (Signed)
?PROGRESS NOTE ? ? ? ?Stephanie [name]  ZOX:[id number] DOB: 1950/07/21 DOA: 05/16/2021 ?PCP: [name], FNP ? ? ?Brief Narrative:  ?This is a 71 year old female with medical history significant of with history of COPD, DVT on Eliquis, GERD, hyperlipidemia, hypertension, hypothyroidism, type 2 diabetes mellitus, TIA who presented to ED with the complaint of shortness of breath and left hand numbness and weakness.  Chest x-ray showed possible pulmonary edema.  BNP also significantly elevated from her baseline, presented with 1488 while it was only 59 about a year ago.  Echo now shows slightly reduced ejection fraction of 45 to 50% as opposed to 50 to 55% last year.  She appears to be having acute on chronic combined systolic and diastolic congestive heart failure.  She received Lasix IV 40 mg. CT head is negative and carotid Doppler is negative as well. She was seen by telemetry neurology who had recommended holding her Eliquis and placing her on DVT prophylaxis dose of Lovenox so far but patient continued to be on Eliquis. She was transferred to [facility] for further work-up including MRI. ? ?Assessment & Plan: ?  ?Principal Problem: ?  Left hand weakness ?Active Problems: ?  Hypertension ?  GERD (gastroesophageal reflux disease) ?  COPD (chronic obstructive pulmonary disease) ([place]) ?  Diabetes ([place]) ?  Hypothyroidism ?  Protein-calorie malnutrition, moderate ([place]) ?  CVA (cerebral vascular accident) [facility]) ? ? ?Acute on chronic combined systolic and diastolic congestive heart failure: Although patient is not using any more oxygen than she normally does but she has bilateral lower extremity but she is not feeling any shortness of breath.  I will now resume torsemide/home dose. ? ?Acute ischemic stroke/Left hand weakness: MRI shows multiple infarcts involving the right precentral gyrus and right frontal lobe.  Neurology on board.  MR angiogram of head is negative for any large vessel obstruction.  Likely embolic. TCD  bubble study pending today.  PT OT has recommended outpatient PT OT.  I discussed with Dr. Leonie Man and he has recommended evaluation and resume Eliquis. ? ?Hypothyroidism: Continue Synthroid. ? ?Hypokalemia: Resolved ? ?Type 2 diabetes mellitus: Blood sugar now better.  Continue long-acting insulin 65 units at night and SSI ? ?COPD: Not in exacerbation.  Stable.  Continue breathing treatments as needed. ? ?GERD: Continue PPI. ? ?Essential hypertension: Blood pressure controlled while her antihypertensives are still on hold. ? ?Hyperlipidemia: Continue pravastatin. ? ?Morbid obesity/possible OSA: Dr. Leonie Man has spoken to her and she is going to be having sleep study here tonight.  If she were to be positive, she will have to wear CPAP tomorrow night. ? ?DVT prophylaxis: SCD's Start: 05/17/21 0341, resuming Eliquis. ?  Code Status: Full Code  ?Family Communication:  None present at bedside.  Plan of care discussed with patient in length and he/she verbalized understanding and agreed with it. ? ?Status is: Inpatient ?Remains inpatient appropriate because: Needs further stroke work-up ? ? ?Estimated body mass index is 36.75 kg/m? as calculated from the following: ?  Height as of this encounter: 5' 8"  (1.727 m). ?  Weight as of this encounter: 109.6 kg. ? ?  ?Nutritional Assessment: ?Body mass index is 36.75 kg/m?Marland KitchenMarland Kitchen ?Seen by dietician.  I agree with the assessment and plan as outlined below: ?Nutrition Status: ?  ?  ?  ? ?. ?Skin Assessment: ?I have examined the patient's skin and I agree with the wound assessment as performed by the wound care RN as outlined below: ?  ? ?Consultants:  ?Neurology ? ?  Procedures:  ?None ? ?Antimicrobials:  ?Anti-infectives (From admission, onward)  ? ? None  ? ?  ?  ? ? ?Subjective: ?Patient seen and examined.  Still with the left hand numbness and weakness which is improving very slowly.  No other complaint. ? ?Objective: ?Vitals:  ? 05/19/21 0500 05/19/21 0752 05/19/21 0801 05/19/21 1131   ?BP: 128/60  139/82 (!) 145/71  ?Pulse:   91 96  ?Resp: 20  16 16   ?Temp: 98 ?F (36.7 ?C)     ?TempSrc: Oral     ?SpO2: 100% 99% 100% 97%  ?Weight:      ?Height:      ? ? ?Intake/Output Summary (Last 24 hours) at 05/19/2021 1406 ?Last data filed at 05/18/2021 2153 ?Gross per 24 hour  ?Intake 120 ml  ?Output 500 ml  ?Net -380 ml  ? ? ?Filed Weights  ? 05/16/21 2304 05/17/21 1610  ?Weight: 101.6 kg 109.6 kg  ? ? ?Examination: ? ?General exam: Appears calm and comfortable, obese ?Respiratory system: Clear to auscultation. Respiratory effort normal. ?Cardiovascular system: S1 & S2 heard, RRR. No JVD, murmurs, rubs, gallops or clicks.  Trace pitting edema bilateral lower extremity. ?Gastrointestinal system: Abdomen is nondistended, soft and nontender. No organomegaly or masses felt. Normal bowel sounds heard. ?Central nervous system: Alert and oriented.  4/5 power in left upper extremity. ?Extremities: Symmetric 5 x 5 power. ?Skin: No rashes, lesions or ulcers.  ?Psychiatry: Judgement and insight appear normal. Mood & affect appropriate.  ? ? ?Data Reviewed: I have personally reviewed following labs and imaging studies ? ?CBC: ?Recent Labs  ?Lab 05/17/21 ?0023 05/17/21 ?4970 05/18/21 ?2637  ?WBC 10.4 11.4* 9.2  ?NEUTROABS 7.9*  --  6.2  ?HGB 12.4 12.6 13.0  ?HCT 42.1 40.2 40.9  ?MCV 88.1 88.0 84.7  ?PLT 237 234 244  ? ? ?Basic Metabolic Panel: ?Recent Labs  ?Lab 05/17/21 ?8588 05/17/21 ?5027 05/18/21 ?7412 05/19/21 ?8786  ?NA 135 137 138 140  ?K 3.5 3.7 3.4* 3.7  ?CL 106 106 105 105  ?CO2 23 23 26 28   ?GLUCOSE 152* 178* 64* 83  ?BUN 19 17 13 13   ?CREATININE 1.19* 0.98 1.12* 1.05*  ?CALCIUM 8.6* 8.8* 8.9 9.1  ? ? ?GFR: ?Estimated Creatinine Clearance: 64.7 mL/min (A) (by C-G formula based on SCr of 1.05 mg/dL (H)). ?Liver Function Tests: ?Recent Labs  ?Lab 05/17/21 ?0023 05/17/21 ?7672  ?AST 30 27  ?ALT 35 33  ?ALKPHOS 351* 350*  ?BILITOT 0.7 0.8  ?PROT 7.7 7.6  ?ALBUMIN 2.6* 2.6*  ? ? ?No results for input(s): LIPASE,  AMYLASE in the last 168 hours. ?No results for input(s): AMMONIA in the last 168 hours. ?Coagulation Profile: ?Recent Labs  ?Lab 05/17/21 ?0023  ?INR 1.1  ? ? ?Cardiac Enzymes: ?No results for input(s): CKTOTAL, CKMB, CKMBINDEX, TROPONINI in the last 168 hours. ?BNP (last 3 results) ?No results for input(s): PROBNP in the last 8760 hours. ?HbA1C: ?Recent Labs  ?  05/17/21 ?0629  ?HGBA1C 8.3*  ? ? ?CBG: ?Recent Labs  ?Lab 05/18/21 ?1635 05/18/21 ?2043 05/18/21 ?2136 05/19/21 ?0618 05/19/21 ?1131  ?GLUCAP 86 148* 129* 114* 101*  ? ? ?Lipid Profile: ?Recent Labs  ?  05/17/21 ?0629  ?CHOL 254*  ?HDL 75  ?LDLCALC 166*  ?TRIG 67  ?CHOLHDL 3.4  ? ? ?Thyroid Function Tests: ?No results for input(s): TSH, T4TOTAL, FREET4, T3FREE, THYROIDAB in the last 72 hours. ?Anemia Panel: ?No results for input(s): VITAMINB12, FOLATE, FERRITIN, TIBC, IRON, RETICCTPCT in the last  72 hours. ?Sepsis Labs: ?No results for input(s): PROCALCITON, LATICACIDVEN in the last 168 hours. ? ?Recent Results (from the past 240 hour(s))  ?Resp Panel by RT-PCR (Flu A&B, Covid) Nasopharyngeal Swab     Status: None  ? Collection Time: 05/17/21 12:21 AM  ? Specimen: Nasopharyngeal Swab; Nasopharyngeal(NP) swabs in vial transport medium  ?Result Value Ref Range Status  ? SARS Coronavirus 2 by RT PCR NEGATIVE NEGATIVE Final  ?  Comment: (NOTE) ?SARS-CoV-2 target nucleic acids are NOT DETECTED. ? ?The SARS-CoV-2 RNA is generally detectable in upper respiratory ?specimens during the acute phase of infection. The lowest ?concentration of SARS-CoV-2 viral copies this assay can detect is ?138 copies/mL. A negative result does not preclude SARS-Cov-2 ?infection and should not be used as the sole basis for treatment or ?other patient management decisions. A negative result may occur with  ?improper specimen collection/handling, submission of specimen other ?than nasopharyngeal swab, presence of viral mutation(s) within the ?areas targeted by this assay, and  inadequate number of viral ?copies(<138 copies/mL). A negative result must be combined with ?clinical observations, patient history, and epidemiological ?information. The expected result is Negative. ? ?Fact Sheet fo

## 2021-05-19 NOTE — Progress Notes (Signed)
Patient cbg rechecked. New reading 85 mg/dl. ? ?Gwendolyn Grant, RN  ?

## 2021-05-19 NOTE — Progress Notes (Signed)
Occupational Therapy Treatment ?Patient Details ?Name: Roma Bondar Ipock ?MRN: [id number] ?DOB: [birth year] ?Today's Date: 05/19/2021 ? ? ?History of present illness 71 y.o. female with medical history significant of with history of COPD, DVT on Eliquis, GERD, hyperlipidemia, hypertension, hypothyroidism, type 2 diabetes mellitus, TIA, and more presents to the ED with a chief complaint of dyspnea and L hand weakness.  MRI is pending 4/1. ?  ?OT comments ? Patient seated in recliner and eager to participate. Patient ambulated to sink without an assistive device and was able to stand to perform grooming tasks and performed brushing hair seated. Patient demonstrated no loss of balance.  Patient states she attempts to use LUE but difficult due to right handed. Acute OT to continue to follow.   ? ?Recommendations for follow up therapy are one component of a multi-disciplinary discharge planning process, led by the attending physician.  Recommendations may be updated based on patient status, additional functional criteria and insurance authorization. ?   ?Follow Up Recommendations ? Outpatient OT  ?  ?Assistance Recommended at Discharge Intermittent Supervision/Assistance  ?Patient can return home with the following ? A little help with walking and/or transfers;A little help with bathing/dressing/bathroom;Assistance with cooking/housework ?  ?Equipment Recommendations ? Tub/shower seat  ?  ?Recommendations for Other Services   ? ?  ?Precautions / Restrictions Precautions ?Precautions: None ?Restrictions ?Weight Bearing Restrictions: No  ? ? ?  ? ?Mobility Bed Mobility ?Overal bed mobility: Modified Independent ?  ?  ?  ?  ?  ?  ?General bed mobility comments: seated in recliner ?  ? ?Transfers ?Overall transfer level: Needs assistance ?Equipment used: None ?Transfers: Sit to/from Stand, Bed to chair/wheelchair/BSC ?Sit to Stand: Supervision ?  ?  ?Step pivot transfers: Min guard ?  ?  ?General transfer comment: min guard for safety  with mobility and transfers ?  ?  ?Balance Overall balance assessment: Mild deficits observed, not formally tested ?  ?  ?  ?  ?  ?  ?  ?  ?  ?  ?  ?  ?  ?  ?  ?  ?  ?  ?   ? ?ADL either performed or assessed with clinical judgement  ? ?ADL Overall ADL's : Needs assistance/impaired ?  ?  ?Grooming: Wash/dry hands;Wash/dry face;Oral care;Brushing hair;Supervision/safety;Standing;Sitting ?Grooming Details (indicate cue type and reason): performed oral care and hand/face hygiene standing and performed brushing hair seated ?  ?  ?  ?  ?  ?  ?  ?  ?  ?  ?  ?  ?  ?  ?Functional mobility during ADLs: Min guard (with assistive device) ?General ADL Comments: seated for brushing hair due to fatigue ?  ? ?Extremity/Trunk Assessment Upper Extremity Assessment ?LUE Deficits / Details: weak grip, decreased in hand manipulation, able to touch her mouth and the top of her head. ?LUE Sensation: WNL ?LUE Coordination: decreased fine motor ?  ?  ?  ?  ?  ? ?Vision   ?  ?  ?Perception   ?  ?Praxis   ?  ? ?Cognition Arousal/Alertness: Awake/alert ?Behavior During Therapy: [facility] for tasks assessed/performed ?Overall Cognitive Status: Within Functional Limits for tasks assessed ?  ?  ?  ?  ?  ?  ?  ?  ?  ?  ?  ?  ?  ?  ?  ?  ?General Comments: alert and oriented ?  ?  ?   ?Exercises   ? ?  ?Shoulder Instructions   ? ? ?  ?  General Comments    ? ? ?Pertinent Vitals/ Pain       Pain Assessment ?Pain Assessment: No/denies pain ? ?Home Living   ?  ?  ?  ?  ?  ?  ?  ?  ?  ?  ?  ?  ?  ?  ?  ?  ?  ?  ? ?  ?Prior Functioning/Environment    ?  ?  ?  ?   ? ?Frequency ? Min 2X/week  ? ? ? ? ?  ?Progress Toward Goals ? ?OT Goals(current goals can now be found in the care plan section) ? Progress towards OT goals: Progressing toward goals ? ?Acute Rehab OT Goals ?Patient Stated Goal: get stronger ?OT Goal Formulation: With patient ?Time For Goal Achievement: 05/31/21 ?Potential to Achieve Goals: Good ?ADL Goals ?Pt Will Perform Grooming: with modified  independence;standing ?Pt Will Perform Lower Body Dressing: with modified independence;sit to/from stand ?Pt Will Transfer to Toilet: with modified independence;ambulating;regular height toilet  ?Plan Discharge plan remains appropriate   ? ?Co-evaluation ? ? ?   ?  ?  ?  ?  ? ?  ?AM-PAC OT "6 Clicks" Daily Activity     ?Outcome Measure ? ? Help from another person eating meals?: None ?Help from another person taking care of personal grooming?: A Little ?Help from another person toileting, which includes using toliet, bedpan, or urinal?: A Little ?Help from another person bathing (including washing, rinsing, drying)?: A Little ?Help from another person to put on and taking off regular upper body clothing?: A Little ?Help from another person to put on and taking off regular lower body clothing?: A Little ?6 Click Score: 19 ? ?  ?End of Session Equipment Utilized During Treatment: Oxygen ? ?OT Visit Diagnosis: Hemiplegia and hemiparesis ?Hemiplegia - Right/Left: Left ?Hemiplegia - dominant/non-dominant: Non-Dominant ?Hemiplegia - caused by: Unspecified ?  ?Activity Tolerance Patient limited by fatigue ?  ?Patient Left in chair;with call bell/phone within reach ?  ?Nurse Communication Mobility status ?  ? ?   ? ?Time: 3299-2426 ?OT Time Calculation (min): 25 min ? ?Charges: OT General Charges ?$OT Visit: 1 Visit ?OT Treatments ?$Self Care/Home Management : 23-37 mins ? ?Lodema Hong, OTA ?Acute Rehabilitation Services  ?Pager [phone] ?Office [phone] ? ? ?[place] ?05/19/2021, 11:10 AM ?

## 2021-05-19 NOTE — Progress Notes (Signed)
Patient with another hypoglycemic event of 67. Given soda and peanut butter. Dinner on the way from cafe. Loann Quill, MD notified. Will recheck cbg per hypoglycemic protocol. ? ?Gwendolyn Grant, RN  ?

## 2021-05-19 NOTE — Progress Notes (Addendum)
Patient with hypoglycemic event of 64. Patient alert with visible tremors. Patient given soda and peanut butter. Loann Quill, MD notified. ? ?Gwendolyn Grant, RN  ? ? ?

## 2021-05-19 NOTE — Progress Notes (Addendum)
STROKE TEAM PROGRESS NOTE  ? ?INTERVAL HISTORY ?Patient is seen in the room with no family at bedside.  She reports that her left hand weakness has improved significantly compared to yesterday.  When asked about her Eliquis, she reports that she misses doses. She reports that she had an episode of coughing that immediately preceded the onset of her stroke symptoms.   ? ?MRI was notable for acute infarcts involving the right precentral gyrus, and right frontal lobe white matter. Additional infarct involving the right insula may be subacute. ? ?Vitals:  ? 05/19/21 0500 05/19/21 0752 05/19/21 0801 05/19/21 1131  ?BP: 128/60  139/82 (!) 145/71  ?Pulse:   91 96  ?Resp: 20  16 16   ?Temp: 98 ?F (36.7 ?C)     ?TempSrc: Oral     ?SpO2: 100% 99% 100% 97%  ?Weight:      ?Height:      ? ?CBC:  ?Recent Labs  ?Lab 05/17/21 ?0023 05/17/21 ?2706 05/18/21 ?2376  ?WBC 10.4 11.4* 9.2  ?NEUTROABS 7.9*  --  6.2  ?HGB 12.4 12.6 13.0  ?HCT 42.1 40.2 40.9  ?MCV 88.1 88.0 84.7  ?PLT 237 234 244  ? ?Basic Metabolic Panel:  ?Recent Labs  ?Lab 05/18/21 ?0956 05/19/21 ?2831  ?NA 138 140  ?K 3.4* 3.7  ?CL 105 105  ?CO2 26 28  ?GLUCOSE 64* 83  ?BUN 13 13  ?CREATININE 1.12* 1.05*  ?CALCIUM 8.9 9.1  ? ?Lipid Panel:  ?Recent Labs  ?Lab 05/17/21 ?5176  ?CHOL 254*  ?TRIG 67  ?HDL 75  ?CHOLHDL 3.4  ?VLDL 13  ?[place] 166*  ? ?HgbA1c:  ?Recent Labs  ?Lab 05/17/21 ?1607  ?HGBA1C 8.3*  ? ?Urine Drug Screen:  ?Recent Labs  ?Lab 05/17/21 ?3710  ?LABOPIA NONE DETECTED  ?COCAINSCRNUR NONE DETECTED  ?LABBENZ NONE DETECTED  ?AMPHETMU NONE DETECTED  ?THCU NONE DETECTED  ?LABBARB NONE DETECTED  ?  ?Alcohol Level  ?Recent Labs  ?Lab 05/17/21 ?0023  ?ETH <10  ? ? ?IMAGING past 24 hours ?No results found. ? ?PHYSICAL EXAM ? ?Physical Exam  ?Constitutional: Appears well-developed and well-nourished.  ?Psych: Affect appropriate to situation ?Eyes: No scleral injection ?HENT: No OP obstrucion ?MSK: no joint deformities.  ?Cardiovascular: Normal rate and regular rhythm.   ?Respiratory: Effort normal, non-labored breathing ?GI: Soft.  No distension. ?Skin: WDI ? ?Neuro: ?Mental Status: ?Patient is awake, alert, oriented to person, place, month, year, and situation. ?Patient is able to give a clear and coherent history. ?No signs of aphasia or neglect ?Cranial Nerves: ?II: Visual Fields are full. Pupils are equal, round, and reactive to light.   ?III,IV, VI: EOMI without ptosis or diploplia.  ?V: untested ?VII: Facial movement is symmetric resting and smiling ?VIII: Hearing is intact to voice ?X: untested ?XI: Shoulder shrug is symmetric. ?XII: Tongue protrudes midline without atrophy or fasciculations.  ?Motor: ?L hand with significant anti-gravity movement, grip strength improved from yesterday ?Otherwise 5/5 strength in all extremities ?Sensory: ?Sensation is symmetric to light touch and temperature in the arms and legs.  ?Deep Tendon Reflexes: ?untested ? ?NIHSS 0 Premorbid MRS 0 ? ?ASSESSMENT/PLAN ?[name] is a 71 y.o. female with history of  hypertension, diabetes, DVT on anticoagulation, COPD, presenting with L hand weakness.  ? ?Stroke: acute infarcts involving the right pre-central gyrus  and the right frontal white matter, most likely due to embolic source in a patient with no past Hx of afib or structural heart disease. Studies negative for large  PFO.  ? ?Code stroke, CT head No acute abnormality. ASPECTS 10.    ?MRI: acute infarcts involving the right precentral gyrus, including the "hand knob," and right frontal lobe white matter. Additional infarct involving the right insula may be subacute. ?MRA Head: no intracranial large vessel occlusion or significant stenosis. ?Carotid Doppler: "normal study" ?2D Echo EF 45-50%, G2DD, no atrial shunt ?Trans-cranial doppler: negative for large PFO, sub-optimal study given lack of ability to localize MCA (ophthalmic artery was used instead).  ?LDL 166 ?HgbA1c 8.3 ?VTE prophylaxis - Lovenox ?   ?Diet  ? Diet Carb Modified Fluid  consistency: Thin; Room service appropriate? Yes  ? ?On Eliquis 5 mg BID prior to admission, restarted 4/3 ?Therapy recommendations:  outpatient OT ?Disposition:  home ? ?Hypertension ?Home meds: bisoprolol  ?Unstable ?Permissive hypertension (OK if < 220/120) but gradually normalize in 5-7 days ?Long-term BP goal normotensive ? ?Hyperlipidemia ?Home meds: pravastatin 80 mg ?LDL 166, goal < 70 ?Changed to atorvastatin 80 mg  ?Continue statin at discharge ? ?Diabetes type II Uncontrolled ?Home meds:  Jardiance 10 mg daily, Humalog 50U with breakfast and dinner, 30 units with lunch  ?HgbA1c 8.3, goal < 7.0 ?CBGs ?Recent Labs  ?  05/18/21 ?2136 05/19/21 ?0618 05/19/21 ?1131  ?GLUCAP 129* 114* 101*  ?  ?SSI ? ?Other Stroke Risk Factors ?Advanced Age >/= 42  ?Cigarette smoker, advised to stop smoking ?ETOH use, alcohol level <10, advised to drink no more than 1-2 drink(s) a day ?Obesity, Body mass index is 36.75 kg/m?., BMI >/= 30 associated with increased stroke risk, recommend weight loss, diet and exercise as appropriate  ? ?Other Active Problems ?Diuresis as managed by primary ? ?Hospital day # 1 ? ?I have personally obtained history,examined this patient, reviewed notes, independently viewed imaging studies, participated in medical decision making and plan of care.ROS completed by me personally and pertinent positives fully documented  I have made any additions or clarifications directly to the above note. Agree with note above.  Patient presented with sudden onset left hand weakness and MRI scan shows acute and subacute embolic infarcts in the right frontal and insular cortex likely of embolic etiology.  Continue ongoing stroke work-up.  Continue Eliquis for stroke prevention given history of DVT and need for long-term anticoagulation.  TCD bubble study was performed at the bedside and is negative for large right-to-left shunt.  Appears to be at risk for obstructive sleep apnea and is interested in participating in  the sleep smart stroke study for stroke prevention.  We will give her information to review and decide.  Greater than 50% time during this 50-minute visit was spent in counseling and coordination of care and discussion about her embolic strokes and stroke evaluation and prevention and discussion about sleep apnea.  Discussed with Dr. Darliss Cheney ?Antony Contras, MD ?Medical Director ?[facility] ?Pager: [phone] ?05/19/2021 5:13 PM ? ? ?To contact Stroke Continuity provider, please refer to [email or web address] ?After hours, contact General Neurology ? ?

## 2021-05-19 NOTE — Progress Notes (Signed)
?   05/19/21 2200  ?Provider Notification  ?Provider Name/Title Dr Hal Hope  ?Date Provider Notified 05/19/21  ?Time Provider Notified 2200  ?Method of Notification Page ?(secure chart)  ?Notification Reason Other (Comment) ?(Pt's CBG 141, Scheled Levemir 65unit tonight, CBG oreviously low)  ?Provider response See new orders  ?Date of Provider Response 05/19/21  ?Time of Provider Response 2208  ? ? ?

## 2021-05-19 NOTE — Progress Notes (Signed)
PT CBG rechecked. New reading 86 mg/dl ? ?Gwendolyn Grant, RN  ?

## 2021-05-19 NOTE — Progress Notes (Signed)
TCD bubble study and lower extremity venous duplex has been completed.  ? ?Preliminary results in CV Proc.  ? ?Stephanie [name] ?05/19/2021 4:43 PM    ?

## 2021-05-19 NOTE — TOC Initial Note (Signed)
Transition of Care (TOC) - Initial/Assessment Note  ? ? ?Patient Details  ?Name: Stephanie [name] ?MRN: [id number] ?Date of Birth: 01-25-1951 ? ?Transition of Care (TOC) CM/SW Contact:    ?Pollie Friar, RN ?Phone Number: ?05/19/2021, 11:10 AM ? ?Clinical Narrative:                 ?Patient lives with one of her daughters at home. Daughter works during the day but patient states she can call if she has any needs.  ?Patient administers her own medications and denies any issues with her meds at home.  ?Pt's daughters provide needed transportation.  ?Recommendations for outpatient therapy. Pt prefers to attend at [facility]. Orders in Epic and information on the AVS. ?TOC following. ? ?Expected Discharge Plan: OP Rehab ?Barriers to Discharge: Continued Medical Work up ? ? ?Patient Goals and CMS Choice ?  ?  ?Choice offered to / list presented to : Patient ? ?Expected Discharge Plan and Services ?Expected Discharge Plan: OP Rehab ?  ?Discharge Planning Services: CM Consult ?  ?Living arrangements for the past 2 months: [place] ?                ?  ?  ?  ?  ?  ?  ?  ?  ?  ?  ? ?Prior Living Arrangements/Services ?Living arrangements for the past 2 months: [place] ?Lives with:: Adult Children ?Patient language and need for interpreter reviewed:: Yes ?Do you feel safe going back to the place where you live?: Yes      ?  ?  ?Current home services: DME (cane/ rollator/ 3 in 1/ shower seat/ oxygen 2L through [place]) ?Criminal Activity/Legal Involvement Pertinent to Current Situation/Hospitalization: No - Comment as needed ? ?Activities of Daily Living ?Home Assistive Devices/Equipment: None ?ADL Screening (condition at time of admission) ?Patient's cognitive ability adequate to safely complete daily activities?: Yes ?Is the patient deaf or have difficulty hearing?: No ?Does the patient have difficulty seeing, even when wearing glasses/contacts?: No ?Does the patient have difficulty concentrating, remembering,  or making decisions?: No ?Patient able to express need for assistance with ADLs?: Yes ?Does the patient have difficulty dressing or bathing?: Yes ?Independently performs ADLs?: Yes (appropriate for developmental age) ?Does the patient have difficulty walking or climbing stairs?: No ?Weakness of Legs: None ?Weakness of Arms/Hands: Left ? ?Permission Sought/Granted ?  ?  ?   ?   ?   ?   ? ?Emotional Assessment ?Appearance:: Appears stated age ?Attitude/Demeanor/Rapport: Engaged ?Affect (typically observed): Accepting ?Orientation: : Oriented to Self, Oriented to Place, Oriented to  Time, Oriented to Situation ?  ?Psych Involvement: No (comment) ? ?Admission diagnosis:  COPD exacerbation ([place]) [J44.1] ?Left hand weakness [R29.898] ?Acute CVA (cerebrovascular accident) ([place]) [I63.9] ?CVA (cerebral vascular accident) [facility]) [I63.9] ?Patient Active Problem List  ? Diagnosis Date Noted  ? CVA (cerebral vascular accident) ([place]) 05/18/2021  ? Left hand weakness 05/17/2021  ? Protein-calorie malnutrition, moderate ([place]) 05/17/2021  ? NASH (nonalcoholic steatohepatitis) 01/21/2021  ? Sease (dyspnea on exertion) 06/18/2020  ? Chronic diastolic CHF (congestive heart failure) ([place])   ? COVID-19 03/17/2020  ? Pneumonia due to COVID-19 virus 03/11/2020  ? Acute respiratory disease due to COVID-19 virus 03/11/2020  ? Abnormal LFTs 02/28/2018  ? TIA (transient ischemic attack) 09/15/2017  ? Localized osteoarthritis of right shoulder 08/08/2017  ? Dysphagia 12/16/2015  ? 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 Constipation 10/27/2015  ? Diarrhea 08/02/2015  ?  Elevated alkaline phosphatase level 07/23/2015  ? Heme positive stool 07/23/2015  ? Hypoglycemia 07/01/2015  ? Deep vein thrombosis (DVT) of both lower extremities ([place]) 06/27/2015  ? 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 Maculopapular rash, generalized 06/27/2015  ? Alopecia 06/27/2015  ? Hypothyroidism 06/27/2015  ? Diverticulitis 10/19/2013  ?  Tobacco abuse 10/19/2013  ? Obesity 10/19/2013  ? Diabetes ([place]) 10/19/2013  ? Hypertension   ? Anemia   ? GERD (gastroesophageal reflux disease)   ? COPD (chronic obstructive pulmonary disease) ([place])   ? ?PCP:  [name], FNP ?Pharmacy:   ?[id number] - [place], [address] ?[place] ?[place] [address] ?Phone: [phone] Fax: [phone] ? ? ? ? ?Social Determinants of Health (SDOH) Interventions ?  ? ?Readmission Risk Interventions ?   ? View : No data to display.  ?  ?  ?  ? ? ? ?

## 2021-05-20 ENCOUNTER — Encounter: Payer: Self-pay | Admitting: *Deleted

## 2021-05-20 ENCOUNTER — Other Ambulatory Visit: Payer: Self-pay | Admitting: Cardiology

## 2021-05-20 DIAGNOSIS — I5043 Acute on chronic combined systolic (congestive) and diastolic (congestive) heart failure: Secondary | ICD-10-CM | POA: Diagnosis present

## 2021-05-20 DIAGNOSIS — Z9189 Other specified personal risk factors, not elsewhere classified: Secondary | ICD-10-CM

## 2021-05-20 DIAGNOSIS — I639 Cerebral infarction, unspecified: Secondary | ICD-10-CM

## 2021-05-20 DIAGNOSIS — J441 Chronic obstructive pulmonary disease with (acute) exacerbation: Secondary | ICD-10-CM

## 2021-05-20 LAB — BASIC METABOLIC PANEL
Anion gap: 7 (ref 5–15)
BUN: 14 mg/dL (ref 8–23)
CO2: 31 mmol/L (ref 22–32)
Calcium: 8.7 mg/dL — ABNORMAL LOW (ref 8.9–10.3)
Chloride: 103 mmol/L (ref 98–111)
Creatinine, Ser: 1.07 mg/dL — ABNORMAL HIGH (ref 0.44–1.00)
GFR, Estimated: 56 mL/min — ABNORMAL LOW (ref >60)
Glucose, Bld: 100 mg/dL — ABNORMAL HIGH (ref 70–99)
Potassium: 3.7 mmol/L (ref 3.5–5.1)
Sodium: 141 mmol/L (ref 135–145)

## 2021-05-20 LAB — GLUCOSE, CAPILLARY
Glucose-Capillary: 124 mg/dL — ABNORMAL HIGH (ref 70–99)
Glucose-Capillary: 103 mg/dL — ABNORMAL HIGH (ref 70–99)
Glucose-Capillary: 83 mg/dL (ref 70–99)
Glucose-Capillary: 196 mg/dL — ABNORMAL HIGH (ref 70–99)
Glucose-Capillary: 222 mg/dL — ABNORMAL HIGH (ref 70–99)

## 2021-05-20 MED ORDER — METHOCARBAMOL 500 MG PO TABS
500.0000 mg | ORAL_TABLET | Freq: Three times a day (TID) | ORAL | Status: DC | PRN
  Administered 2021-05-20 (×2): 500 mg via ORAL
  Filled 2021-05-20 (×2): qty 1

## 2021-05-20 MED ORDER — GUAIFENESIN-DM 100-10 MG/5ML PO SYRP
5.0000 mL | ORAL_SOLUTION | ORAL | Status: DC | PRN
  Administered 2021-05-20 – 2021-05-21 (×2): 5 mL via ORAL
  Filled 2021-05-20 (×2): qty 5

## 2021-05-20 MED ORDER — MONTELUKAST SODIUM 10 MG PO TABS
10.0000 mg | ORAL_TABLET | Freq: Every evening | ORAL | Status: DC
  Administered 2021-05-20: 10 mg via ORAL
  Filled 2021-05-20: qty 1

## 2021-05-20 MED ORDER — BISOPROLOL FUMARATE 10 MG PO TABS
10.0000 mg | ORAL_TABLET | Freq: Two times a day (BID) | ORAL | Status: DC
  Administered 2021-05-20 – 2021-05-21 (×3): 10 mg via ORAL
  Filled 2021-05-20 (×4): qty 1

## 2021-05-20 NOTE — Progress Notes (Signed)
[name], female   DOB: 11/11/1950, 71 y.o.   MRN: [id number] ?Patient enrolled for [employer] to ship a 30 day cardiac event monitor to her address on file. ? ?Letter with instructions mailed to patient. ?

## 2021-05-20 NOTE — Assessment & Plan Note (Signed)
Patient with fatigue in the morning with reported snoring.  High risk for obstructive sleep apnea given her body habitus. ?-- Currently undergoing sleep study per neurology ?

## 2021-05-20 NOTE — Hospital Course (Addendum)
Stephanie [name] was admitted to the hospital the with working diagnosis of cryptogenic stroke.  ? ?Stephanie [name] is a 71 year old female with past medical history significant for COPD, DVT on Eliquis, GERD, hyperlipidemia,/hypertension, hypothyroidism, type 2 diabetes mellitus, history of TIA who presented to [facility] ED on 3/31 with shortness of breath, left hand numbness and weakness. On her initial physical examination her blood pressure was 182/95, HR 94, RR 20, 02 saturation 100%, left hand grip was reduced, lungs were clear to auscultation, heart with S1 and S2 present and rhythmic, abdomen soft and non distended, no lower extremity edema.  ? ?Na 135, K 3,5 Cl 106, bicarbonate 23, glucose 152, BUN 19, cr 1,19  ?BNP 1,488 ?Wbc 10.4, hgb 12.4 hct 42,1 plt 237  ?Sars covid 19 negative  ?UA SG 1.006, pH 6.0, 0-5 wbc  ? ?Chest radiograph with cardiomegaly and symmetric bilateral interstitial infiltrates.  ? ?EKG 95 bpm, normal axis and normal intervals, sinus rhythm, with no significant ST segment or T wave changes.  ? ?CT head negative, carotid Doppler unrevealing.  Patient received 40 mg IV Lasix in the ED.  Seen by telemetry neurology who recommended transfer to [facility] for further evaluation of possible CVA including MRI.   ? ?Brain MRI with acute infarct right precentral gyrus,, right frontal lobe white matter, and subacute right insula infarct. ?MRA with no large vessel occlusion.  ?Transcranial doppler US with no evidence of right to left shunt.  ? ?Neurology has recommended continue apixaban for chronic deep vein thrombosis, and continue medical management.  ? ?

## 2021-05-20 NOTE — Assessment & Plan Note (Addendum)
Echocardiogram with LV EF 45 to 50%, mild reduced LV systolic function, RV systolic function preserved, no significant valvular disease. ? ?Patient responded well to diuresis, now has been transitioned to torsemide with good toleration.  ?Continue with bisoprolol and resume empagliflozin.  ?Her blood pressure is 131/86 mmHg.  ? ?

## 2021-05-20 NOTE — Progress Notes (Signed)
?PROGRESS NOTE ? ? ? ?Stephanie [name]  KJZ:[id number] DOB: 03/22/1950 DOA: 05/16/2021 ?PCP: [name], FNP  ? ? ?Brief Narrative:  ?Stephanie [name] is a 71 year old female with past medical history significant for COPD, DVT on Eliquis, GERD, hyperlipidemia,/hypertension, hypothyroidism, type 2 diabetes mellitus, history of TIA who presented to [facility] ED on 3/31 with shortness of breath, left hand numbness and weakness. ? ?In the ED, chest x-ray with possible pulmonary edema.  BNP elevated 1004 and 88.  CT head negative, carotid Doppler unrevealing.  Patient received 40 mg IV Lasix in the ED.  Seen by telemetry neurology who recommended transfer to [facility] for further evaluation of possible CVA including MRI.  TRH consulted for further evaluation and management.  ? ? ?Assessment & Plan: ?  ?Assessment and Plan: ?* Acute CVA (cerebrovascular accident) ([place]) ?Patient presenting to ED with left hand numbness and decreased strength.  Seen by teleneurology and recommended transfer to [facility] for stroke work-up.  Initial CT head unrevealing.  MR brain with acute infarct right precentral gyrus, right frontal lobe white matter, and subacute right insula infarct.  MRA head with no large vessel occlusion or significant stenosis.  Carotid ultrasound with no concerning findings.  Transcranial Doppler ultrasound that was negative with no evidence of right-left intracardiac communication.  LDL 166, hemoglobin A1c 8.3.  Etiology likely embolic. ?--Neurology following, appreciate assistance ?--Atorvastatin 80 mg p.o. daily ?--Eliquis 5 mg p.o. twice daily ?--PT/OT recommend outpatient therapy ? ? ? ?Acute on chronic combined systolic and diastolic CHF (congestive heart failure) ([place]) ?Patient presenting with some mild shortness of breath, chest x-ray with mild pulmonary edema and elevated BNP.  Received IV Lasix.  Currently oxygenating well on room air. ?--negative 426m past 24h and net negative 2.2L since  admission ?--Torsemide 40 mg daily ?--Strict I's and O's and daily weights ? ?Hypertension ?Home medications include bisoprolol 10 mg p.o. twice daily, and torsemide 40 mg p.o. daily. ?--Resume bisoprolol 10 mg p.o. twice daily today ?--Torsemide 4 mg p.o. daily ?--Continue monitor BP close ? ?COPD (chronic obstructive pulmonary disease) ([place] ?Chest x-ray on admission shows cardiac enlargement with possible pulmonary edema.  No wheezing on physical exam.  On montelukast, Arnuity Ellipta 1 puff qHS and albuterol MDI as needed at home. ?--Montelukast 10 mg p.o. daily ?--Albuterol MDI as needed shortness of breath/wheezing ? ? ?Diabetes ([place] ?Hemoglobin A1c 8.3, not optimally controlled.  Home regimen includes Jardiance 10 mg p.o. daily, Humalog 50 units with breakfast and dinner, 30 units with lunch, and Xultophy 85u [place] qHS.  ?--Montelukast 10 mg p.o. daily ?--Levemir 20 units obviously daily ?--SSI for coverage ?--CBGs qAC/HS ? ?Hypothyroidism ?TSH 3.43 04/03/2021. ?--Levothyroxine 88 mcg p.o. daily ? ? ?GERD (gastroesophageal reflux disease) ?--Protonix 40 mg p.o. daily ? ?Protein-calorie malnutrition, moderate ([place] ?Body mass index is 36.75 kg/m?.[place] Continue to encourage increased oral intake. ? ? ?At risk for obstructive sleep apnea ?Patient with fatigue in the morning with reported snoring.  High risk for obstructive sleep apnea given her body habitus. ?-- Currently undergoing sleep study per neurology ? ? ? ? ?DVT prophylaxis: SCD's Start: 05/17/21 0341 ?apixaban (ELIQUIS) tablet 5 mg  ?  Code Status: Full Code ?Family Communication: No family present at bedside this morning ? ?Disposition Plan:  ?Level of care: Telemetry Medical ?Status is: Inpatient ?Remains inpatient appropriate because: Currently undergoing sleep study per neurology ?  ? ?Consultants:  ?Neurology ? ?Procedures:  ?TTE ?Carotid ultrasound ?Transcranial Doppler ultrasound ? ?Antimicrobials:  ?  None ? ? ?Subjective: ?Patient seen examined at  bedside, resting comfortably.  Sitting in bedside chair.  Eating breakfast.  Requesting restart of her home Robaxin and bisoprolol.  Currently undergoing sleep study.  Appreciative all the care she is receiving at [facility].  No other specific complaints or concerns at this time.  Continues with some mild left hand weakness, slowly improving daily.  Denies headache, no fever/chills/night sweats, no nausea/vomiting/diarrhea, no chest pain, no shortness of breath, no abdominal pain, no fatigue, no paresthesias.  No acute events overnight per nursing staff. ? ?Objective: ?Vitals:  ? 05/20/21 0012 05/20/21 1025 05/20/21 8527 05/20/21 0911  ?BP: (!) 147/88 (!) 154/89 (!) 154/90   ?Pulse: (!) 103 100 (!) 110 (!) 102  ?Resp: 18 18 18 18   ?Temp: 98.3 ?F (36.8 ?C) 97.9 ?F (36.6 ?C) 98.2 ?F (36.8 ?C)   ?TempSrc: Oral Oral Oral   ?SpO2: 100% 100% 100% 98%  ?Weight:      ?Height:      ? ? ?Intake/Output Summary (Last 24 hours) at 05/20/2021 1031 ?Last data filed at 05/20/2021 0341 ?Gross per 24 hour  ?Intake 240 ml  ?Output 950 ml  ?Net -710 ml  ? ?Filed Weights  ? 05/16/21 2304 05/17/21 1610  ?Weight: 101.6 kg 109.6 kg  ? ? ?Examination: ? ?Physical Exam: ?GEN: NAD, alert and oriented x 3, wd/wn ?HEENT: NCAT, PERRL, EOMI, sclera clear, MMM ?PULM: CTAB w/o wheezes/crackles, normal respiratory effort, on room air ?CV: RRR w/o M/G/R ?GI: abd soft, NTND, NABS, no R/G/M ?MSK: no peripheral edema, slightly decreased muscle strength right hand, otherwise muscle strength globally intact 5/5 bilateral upper/lower extremities ?NEURO: CN II-XII intact, no focal deficits, sensation to light touch intact ?PSYCH: normal mood/affect ?Integumentary: dry/intact, no rashes or wounds ? ? ? ?Data Reviewed: I have personally reviewed following labs and imaging studies ? ?CBC: ?Recent Labs  ?Lab 05/17/21 ?0023 05/17/21 ?7824 05/18/21 ?2353  ?WBC 10.4 11.4* 9.2  ?NEUTROABS 7.9*  --  6.2  ?HGB 12.4 12.6 13.0  ?HCT 42.1 40.2 40.9  ?MCV 88.1 88.0 84.7   ?PLT 237 234 244  ? ?Basic Metabolic Panel: ?Recent Labs  ?Lab 05/17/21 ?6144 05/17/21 ?3154 05/18/21 ?0086 05/19/21 ?7619 05/20/21 ?5093  ?NA 135 137 138 140 141  ?K 3.5 3.7 3.4* 3.7 3.7  ?CL 106 106 105 105 103  ?CO2 23 23 26 28 31   ?GLUCOSE 152* 178* 64* 83 100*  ?BUN 19 17 13 13 14   ?CREATININE 1.19* 0.98 1.12* 1.05* 1.07*  ?CALCIUM 8.6* 8.8* 8.9 9.1 8.7*  ? ?GFR: ?Estimated Creatinine Clearance: 63.5 mL/min (A) (by C-G formula based on SCr of 1.07 mg/dL (H)). ?Liver Function Tests: ?Recent Labs  ?Lab 05/17/21 ?0023 05/17/21 ?2671  ?AST 30 27  ?ALT 35 33  ?ALKPHOS 351* 350*  ?BILITOT 0.7 0.8  ?PROT 7.7 7.6  ?ALBUMIN 2.6* 2.6*  ? ?No results for input(s): LIPASE, AMYLASE in the last 168 hours. ?No results for input(s): AMMONIA in the last 168 hours. ?Coagulation Profile: ?Recent Labs  ?Lab 05/17/21 ?0023  ?INR 1.1  ? ?Cardiac Enzymes: ?No results for input(s): CKTOTAL, CKMB, CKMBINDEX, TROPONINI in the last 168 hours. ?BNP (last 3 results) ?No results for input(s): PROBNP in the last 8760 hours. ?HbA1C: ?No results for input(s): HGBA1C in the last 72 hours. ?CBG: ?Recent Labs  ?Lab 05/19/21 ?1713 05/19/21 ?1806 05/19/21 ?2105 05/20/21 ?0141 05/20/21 ?0617  ?GLUCAP 67* 86 141* 124* 103*  ? ?Lipid Profile: ?No results for input(s): CHOL, HDL,  LDLCALC, TRIG, CHOLHDL, LDLDIRECT in the last 72 hours. ?Thyroid Function Tests: ?No results for input(s): TSH, T4TOTAL, FREET4, T3FREE, THYROIDAB in the last 72 hours. ?Anemia Panel: ?No results for input(s): VITAMINB12, FOLATE, FERRITIN, TIBC, IRON, RETICCTPCT in the last 72 hours. ?Sepsis Labs: ?No results for input(s): PROCALCITON, LATICACIDVEN in the last 168 hours. ? ?Recent Results (from the past 240 hour(s))  ?Resp Panel by RT-PCR (Flu A&B, Covid) Nasopharyngeal Swab     Status: None  ? Collection Time: 05/17/21 12:21 AM  ? Specimen: Nasopharyngeal Swab; Nasopharyngeal(NP) swabs in vial transport medium  ?Result Value Ref Range Status  ? SARS Coronavirus 2 by RT PCR  NEGATIVE NEGATIVE Final  ?  Comment: (NOTE) ?SARS-CoV-2 target nucleic acids are NOT DETECTED. ? ?The SARS-CoV-2 RNA is generally detectable in upper respiratory ?specimens during the acute phase of infection. The lowest ?co

## 2021-05-20 NOTE — Progress Notes (Addendum)
STROKE TEAM PROGRESS NOTE  ? ?INTERVAL HISTORY ?Patient reports continued improvement in the quality and strength of her hand movements but still feels she is not back to baseline. She qualified for the Sleep Smart trial and was found to have OSA. She is updated on these results.  She will have CPAP mask tolerability test tonight.  She continues to have mild diminished fine motor skills in the left hand but this time seems to be improving daily . ? ? ?Vitals:  ? 05/20/21 0339 05/20/21 0742 05/20/21 0911 05/20/21 1155  ?BP: (!) 154/89 (!) 154/90  (!) 148/65  ?Pulse: 100 (!) 110 (!) 102 (!) 102  ?Resp: 18 18 18 14   ?Temp: 97.9 ?F (36.6 ?C) 98.2 ?F (36.8 ?C)  98.2 ?F (36.8 ?C)  ?TempSrc: Oral Oral  Oral  ?SpO2: 100% 100% 98% 100%  ?Weight:      ?Height:      ? ?CBC:  ?Recent Labs  ?Lab 05/17/21 ?0023 05/17/21 ?3212 05/18/21 ?2482  ?WBC 10.4 11.4* 9.2  ?NEUTROABS 7.9*  --  6.2  ?HGB 12.4 12.6 13.0  ?HCT 42.1 40.2 40.9  ?MCV 88.1 88.0 84.7  ?PLT 237 234 244  ? ?Basic Metabolic Panel:  ?Recent Labs  ?Lab 05/19/21 ?5003 05/20/21 ?7048  ?NA 140 141  ?K 3.7 3.7  ?CL 105 103  ?CO2 28 31  ?GLUCOSE 83 100*  ?BUN 13 14  ?CREATININE 1.05* 1.07*  ?CALCIUM 9.1 8.7*  ? ?Lipid Panel:  ?Recent Labs  ?Lab 05/17/21 ?8891  ?CHOL 254*  ?TRIG 67  ?HDL 75  ?CHOLHDL 3.4  ?VLDL 13  ?[place] 166*  ? ?HgbA1c:  ?Recent Labs  ?Lab 05/17/21 ?6945  ?HGBA1C 8.3*  ? ?Urine Drug Screen:  ?Recent Labs  ?Lab 05/17/21 ?0388  ?LABOPIA NONE DETECTED  ?COCAINSCRNUR NONE DETECTED  ?LABBENZ NONE DETECTED  ?AMPHETMU NONE DETECTED  ?THCU NONE DETECTED  ?LABBARB NONE DETECTED  ?  ?Alcohol Level  ?Recent Labs  ?Lab 05/17/21 ?0023  ?ETH <10  ? ? ?IMAGING past 24 hours ?VAS [place] TRANSCRANIAL DOPPLER W BUBBLES ? ?Result Date: 05/19/2021 ? Transcranial Doppler with Bubble Patient Name:  VALERIA BOZA Pajak  Date of Exam:   05/19/2021 Medical Rec #: [id number]    Accession #:    [id number] Date of Birth: [birth year]    Patient Gender: F Patient Age:   71 years Exam Location:  [facility] Procedure:      VAS [place] TRANSCRANIAL DOPPLER W BUBBLES Referring Phys: Elwin Sleight DE LA TORRE --------------------------------------------------------------------------------  Indications: Stroke. Comparison Study: no prior Performing Technologist: Archie Patten RVS  Examination Guidelines: A complete evaluation includes B-mode imaging, spectral Doppler, color Doppler, and power Doppler as needed of all accessible portions of each vessel. Bilateral testing is considered an integral part of a complete examination. Limited examinations for reoccurring indications may be performed as noted.  Summary: No HITS at rest or during Valsalva. Negative transcranial Doppler Bubble study with no evidence of right to left intracardiac communication.  A vascular evaluation was performed. The right opthalmic artery was studied. An IV was inserted into the patient's left forearm . Verbal informed consent was obtained.  *See table(s) above for TCD measurements and observations.    Preliminary   ? ?VAS [place] LOWER EXTREMITY VENOUS (DVT) ? ?Result Date: 05/20/2021 ? Lower Venous DVT Study Patient Name:  JAINA MORIN Algeo  Date of Exam:   05/19/2021 Medical Rec #: [id number]    Accession #:    [id number] Date of Birth:  [birth year]    Patient Gender: F Patient Age:   20 years Exam Location:  [facility] Procedure:      VAS [place] LOWER EXTREMITY VENOUS (DVT) Referring Phys: Elwin Sleight DE LA TORRE --------------------------------------------------------------------------------  Indications: Pain.  Limitations: Poor ultrasound/tissue interface. Comparison Study: no prior Performing Technologist: Archie Patten RVS  Examination Guidelines: A complete evaluation includes B-mode imaging, spectral Doppler, color Doppler, and power Doppler as needed of all accessible portions of each vessel. Bilateral testing is considered an integral part of a complete examination. Limited examinations for reoccurring indications may be performed as noted. The  reflux portion of the exam is performed with the patient in reverse Trendelenburg.  +---------+---------------+---------+-----------+----------+------------------+ RIGHT    CompressibilityPhasicitySpontaneityPropertiesThrombus Aging     +---------+---------------+---------+-----------+----------+------------------+ CFV      Full           Yes      Yes                                     +---------+---------------+---------+-----------+----------+------------------+ SFJ      Full                                                            +---------+---------------+---------+-----------+----------+------------------+ FV Prox  Full                                                            +---------+---------------+---------+-----------+----------+------------------+ FV Mid                  Yes      Yes                                     +---------+---------------+---------+-----------+----------+------------------+ FV Distal               Yes      Yes                                     +---------+---------------+---------+-----------+----------+------------------+ PFV      Full                                                            +---------+---------------+---------+-----------+----------+------------------+ POP      Full           Yes      Yes                                     +---------+---------------+---------+-----------+----------+------------------+ PTV      Full  limited                                                                  visualization      +---------+---------------+---------+-----------+----------+------------------+ PERO                                                  Not well                                                                 visualized         +---------+---------------+---------+-----------+----------+------------------+    +---------+---------------+---------+-----------+----------+-------------------+ LEFT     CompressibilityPhasicitySpontaneityPropertiesThrombus Aging      +---------+---------------+---------+-----------+----------+-------------------+ CFV      Full           Yes      Yes                                      +---------+---------------+---------+-----------+----------+-------------------+ SFJ      Full                                                             +---------+---------------+---------+-----------+----------+-------------------+ FV Prox  Full                                                             +---------+---------------+---------+-----------+----------+-------------------+ FV Mid   Full           Yes      Yes                                      +---------+---------------+---------+-----------+----------+-------------------+ FV Distal               Yes      Yes                                      +---------+---------------+---------+-----------+----------+-------------------+ PFV      Full                                                             +---------+---------------+---------+-----------+----------+-------------------+  POP      Full           Yes      Yes                                      +---------+---------------+---------+-----------+----------+-------------------+ PTV                                                   Not well visualized +---------+---------------+---------+-----------+----------+-------------------+ PERO                                                  Not well visualized +---------+---------------+---------+-----------+----------+-------------------+     Summary: RIGHT: - There is no evidence of deep vein thrombosis in the lower extremity. However, portions of this examination were limited- see technologist comments above.  - No cystic structure found in the popliteal fossa.  LEFT: - There is  no evidence of deep vein thrombosis in the lower extremity. However, portions of this examination were limited- see technologist comments above.  - No cystic structure found in the popliteal fossa.  *See table(s) above for measurements and observations. Electronically signed by Deitra Mayo MD on 05/20/2021 at 1:23:37 PM.    Final    ? ?PHYSICAL EXAM ? ?Physical Exam  ?Cons

## 2021-05-20 NOTE — Plan of Care (Signed)
?  Problem: Education: ?Goal: Knowledge of General Education information will improve ?Description: Including pain rating scale, medication(s)/side effects and non-pharmacologic comfort measures ?Outcome: Progressing ?  ?Problem: Health Behavior/Discharge Planning: ?Goal: Ability to manage health-related needs will improve ?Outcome: Progressing ?  ?Problem: Clinical Measurements: ?Goal: Ability to maintain clinical measurements within normal limits will improve ?Outcome: Progressing ?Goal: Will remain free from infection ?Outcome: Progressing ?Goal: Diagnostic test results will improve ?Outcome: Progressing ?Goal: Respiratory complications will improve ?Outcome: Progressing ?Goal: Cardiovascular complication will be avoided ?Outcome: Progressing ?  ?Problem: Activity: ?Goal: Risk for activity intolerance will decrease ?Outcome: Progressing ?  ?Problem: Nutrition: ?Goal: Adequate nutrition will be maintained ?Outcome: Progressing ?  ?Problem: Coping: ?Goal: Level of anxiety will decrease ?Outcome: Progressing ?  ?Problem: Elimination: ?Goal: Will not experience complications related to bowel motility ?Outcome: Progressing ?Goal: Will not experience complications related to urinary retention ?Outcome: Progressing ?  ?Problem: Pain Managment: ?Goal: General experience of comfort will improve ?Outcome: Progressing ?  ?Problem: Safety: ?Goal: Ability to remain free from injury will improve ?Outcome: Progressing ?  ?Problem: Skin Integrity: ?Goal: Risk for impaired skin integrity will decrease ?Outcome: Progressing ?  ?Problem: Education: ?Goal: Knowledge of disease or condition will improve ?Outcome: Progressing ?Goal: Knowledge of secondary prevention will improve (SELECT ALL) ?Outcome: Progressing ?Goal: Knowledge of patient specific risk factors will improve (INDIVIDUALIZE FOR PATIENT) ?Outcome: Progressing ?Goal: Individualized Educational Video(s) ?Outcome: Progressing ?  ?Problem: Health Behavior/Discharge  Planning: ?Goal: Ability to manage health-related needs will improve ?Outcome: Progressing ?  ?Problem: Self-Care: ?Goal: Ability to participate in self-care as condition permits will improve ?Outcome: Progressing ?  ?Problem: Ischemic Stroke/TIA Tissue Perfusion: ?Goal: Complications of ischemic stroke/TIA will be minimized ?Outcome: Progressing ?  ?

## 2021-05-21 ENCOUNTER — Other Ambulatory Visit (HOSPITAL_COMMUNITY): Payer: Self-pay

## 2021-05-21 ENCOUNTER — Encounter (HOSPITAL_COMMUNITY): Payer: Self-pay | Admitting: Hematology

## 2021-05-21 DIAGNOSIS — Z9189 Other specified personal risk factors, not elsewhere classified: Secondary | ICD-10-CM

## 2021-05-21 DIAGNOSIS — I5043 Acute on chronic combined systolic (congestive) and diastolic (congestive) heart failure: Secondary | ICD-10-CM

## 2021-05-21 LAB — GLUCOSE, CAPILLARY
Glucose-Capillary: 189 mg/dL — ABNORMAL HIGH (ref 70–99)
Glucose-Capillary: 224 mg/dL — ABNORMAL HIGH (ref 70–99)
Glucose-Capillary: 261 mg/dL — ABNORMAL HIGH (ref 70–99)

## 2021-05-21 MED ORDER — APIXABAN 5 MG PO TABS: 5.0000 mg | ORAL_TABLET | Freq: Two times a day (BID) | ORAL | Status: DC

## 2021-05-21 MED ORDER — OMEGA-3-ACID ETHYL ESTERS 1 G PO CAPS
2.0000 | ORAL_CAPSULE | Freq: Two times a day (BID) | ORAL | 0 refills | Status: DC
  Filled 2021-05-21: qty 120, 30d supply, fill #0

## 2021-05-21 MED ORDER — ATORVASTATIN CALCIUM 80 MG PO TABS
80.0000 mg | ORAL_TABLET | Freq: Every day | ORAL | 0 refills | Status: DC
  Filled 2021-05-21: qty 30, 30d supply, fill #0

## 2021-05-21 NOTE — Discharge Summary (Addendum)
Physician Discharge Summary   Patient: Stephanie [name]: [id number] DOB: Oct 29, 1950  Admit date:     05/16/2021  Discharge date: 05/21/21  Discharge Physician: York Ram Nahal Wanless   PCP: Marylynn Pearson, FNP   Recommendations at discharge:    Patient has been changed from pravastatin to atorvastatin Continue anticoagulation with apixaban.  Patient on high doses of insulin at home, likely diet related. I explained to Stephanie [name] that in the hospital she has required lower doses of insulin. Recommended to continue low carbohydrate diet and close monitor of glucose, if hypoglycemia to reduce insulin, by 50% if necessary.    Discharge Diagnoses: Principal Problem:   Acute CVA (cerebrovascular accident) (HCC) Active Problems:   Acute on chronic combined systolic and diastolic CHF (congestive heart failure) (HCC)   Hypertension   COPD (chronic obstructive pulmonary disease) (HCC)   Diabetes (HCC)   Hypothyroidism   GERD (gastroesophageal reflux disease)   Protein-calorie malnutrition, moderate (HCC)   At risk for obstructive sleep apnea  Resolved Problems:   * No resolved hospital problems. [facility] Course: Stephanie [name] was admitted to the hospital the with working diagnosis of cryptogenic stroke.   Stephanie [name] is a 71 year old female with past medical history significant for COPD, DVT on Eliquis, GERD, hyperlipidemia,/hypertension, hypothyroidism, type 2 diabetes mellitus, history of TIA who presented to [facility], ED on 3/31 with shortness of breath, left hand numbness and weakness. On her initial physical examination her blood pressure was 182/95, HR 94, RR 20, 02 saturation 100%, left hand grip was reduced, lungs were clear to auscultation, heart with S1 and S2 present and rhythmic, abdomen soft and non distended, no lower extremity edema.   Na 135, K 3,5 Cl 106, bicarbonate 23, glucose 152, BUN 19, cr 1,19  BNP 1,488 Wbc 10.4, hgb 12.4 hct 42,1 plt 237  Sars covid 19 negative   UA SG 1.006, pH 6.0, 0-5 wbc   Chest radiograph with cardiomegaly and symmetric bilateral interstitial infiltrates.   EKG 95 bpm, normal axis and normal intervals, sinus rhythm, with no significant ST segment or T wave changes.   CT head negative, carotid Doppler unrevealing.  Patient received 40 mg IV Lasix in the ED.  Seen by telemetry neurology who recommended transfer to [facility] for further evaluation of possible CVA including MRI.    Brain MRI with acute infarct right precentral gyrus,, right frontal lobe white matter, and subacute right insula infarct. MRA with no large vessel occlusion.  Transcranial doppler US with no evidence of right to left shunt.   Neurology has recommended continue apixaban for chronic deep vein thrombosis, and continue medical management.    Assessment and Plan: * Acute CVA (cerebrovascular accident) [facility]) Patient was admitted to the medical ward and was placed on a remote telemetry monitor.  Placed on frequent neuro checks.  Her focal neurologic deficit improved.   Further work up with MRI brain with acute infarct right precentral gyrus, right frontal lobe white matter, and subacute right insula infarct.   MRA head with no large vessel occlusion or significant stenosis.   Carotid ultrasound with no concerning findings.  Transcranial Doppler ultrasound that was negative with no evidence of right-left intracardiac communication.   LDL 166, hemoglobin A1c 8.3.  Etiology likely embolic/ cryptogenic.   Continue medical therapy with high dose, high potency statin with atorvastatin 80 mg daily, and close blood pressure control.  Continue anticoagulation for DVT (bilateral posterior vein thrombus).    Acute on  chronic combined systolic and diastolic CHF (congestive heart failure) (HCC) Echocardiogram with LV EF 45 to 50%, mild reduced LV systolic function, RV systolic function preserved, no significant valvular disease.  Patient responded well to  diuresis, now has been transitioned to torsemide with good toleration.  Continue with bisoprolol and resume empagliflozin.  Her blood pressure is 131/86 mmHg.    Hypertension Continue blood pressure control with bisoprolol and diuresis with torsemide.   COPD (chronic obstructive pulmonary disease) (HCC) No clinical signs of exacerbation, plan to continue with bronchodilator therapy.  Plan to follow up as outpatient.    Diabetes (HCC) Hemoglobin A1c 8.3, not optimally controlled.    Patient was placed on insulin therapy during her hospitalization.  Her glucose remained sable.   At home patient on Jardiance 10 mg p.o. daily, Humalog 50 units with breakfast and dinner, 30 units with lunch, and Xultophy 85u [place] qHS.   Will need close follow up.   Hypothyroidism TSH 3.43 04/03/2021. --Levothyroxine 88 mcg p.o. daily   GERD (gastroesophageal reflux disease) --Protonix 40 mg p.o. daily  Protein-calorie malnutrition, moderate (HCC) Body mass index is 36.75 kg/m.  Continue to encourage increased oral intake. Obesity class 2    At risk for obstructive sleep apnea Patient with fatigue in the morning with reported snoring.  High risk for obstructive sleep apnea given her body habitus. -- Currently undergoing sleep study per neurology         Consultants: neurology  Procedures performed: none   Disposition: Home Diet recommendation:  Discharge Diet Orders (From admission, onward)     Start     Ordered   05/21/21 0000  Diet - low sodium heart healthy        05/21/21 1356           Carb modified diet DISCHARGE MEDICATION: Allergies as of 05/21/2021       Reactions   Tetracyclines & Related Anaphylaxis   Banana Hives, Nausea And Vomiting   Penicillins Rash, Other (See Comments)   Has patient had a PCN reaction causing immediate rash, facial/tongue/throat swelling, SOB or lightheadedness with hypotension: No Has patient had a PCN reaction causing severe rash  involving mucus membranes or skin necrosis: No Has patient had a PCN reaction that required hospitalization No Has patient had a PCN reaction occurring within the last 10 years: No If all of the above answers are "NO", then may proceed with Cephalosporin use.        Medication List     STOP taking these medications    levofloxacin 500 MG tablet Commonly known as: LEVAQUIN   pravastatin 80 MG tablet Commonly known as: PRAVACHOL   predniSONE 20 MG tablet Commonly known as: DELTASONE       TAKE these medications    Accu-Chek FastClix Lancets Misc Apply topically.   Accu-Chek Guide Control Liqd See admin instructions.   Accu-Chek Guide Me w/Device Kit 4 (four) times daily.   Accu-Chek Guide test strip Generic drug: glucose blood 4 (four) times daily.   acetaminophen 500 MG tablet Commonly known as: TYLENOL Take 1,000 mg by mouth every 6 (six) hours as needed for moderate pain or headache.   albuterol (2.5 MG/3ML) 0.083% nebulizer solution Commonly known as: PROVENTIL Take 2.5 mg by nebulization every 6 (six) hours as needed for wheezing or shortness of breath.   albuterol 108 (90 Base) MCG/ACT inhaler Commonly known as: VENTOLIN HFA Inhale 1-2 puffs into the lungs every 6 (six) hours as needed for wheezing or  shortness of breath.   Alcohol Pads 70 % Pads SMARTSIG:Pledget(s) Topical 4 Times Daily   apixaban 5 MG Tabs tablet Commonly known as: ELIQUIS Take 1 tablet (5 mg total) by mouth 2 (two) times daily.   Arnuity Ellipta 100 MCG/ACT Aepb Generic drug: Fluticasone Furoate Inhale 1 puff into the lungs at bedtime.   atorvastatin 80 MG tablet Commonly known as: LIPITOR Take 1 tablet (80 mg total) by mouth daily. Start taking on: May 22, 2021   bisoprolol 10 MG tablet Commonly known as: ZEBETA Take 1 tablet (10 mg total) by mouth in the morning and at bedtime.   cimetidine 200 MG tablet Commonly known as: TAGAMET Take 0.5 tablets (100 mg total) by  mouth daily as needed. What changed: reasons to take this   CINNAMON PO Take 2 capsules by mouth 3 (three) times daily.   clobetasol ointment 0.05 % Commonly known as: TEMOVATE Apply 1 application topically 3 (three) times daily as needed (imflammation).   DULoxetine 30 MG capsule Commonly known as: CYMBALTA Take 1 capsule (30 mg total) by mouth 2 (two) times daily.   Easy Mini Eject Lancing Device Misc 4 (four) times daily.   fluticasone 27.5 MCG/SPRAY nasal spray Commonly known as: VERAMYST Place 2 sprays into the nose daily.   guaiFENesin 100 MG/5ML Soln Commonly known as: ROBITUSSIN Take 5 mLs (100 mg total) by mouth every 4 (four) hours as needed for cough or to loosen phlegm.   HumaLOG KwikPen 100 UNIT/ML KwikPen Generic drug: insulin lispro Inject 1-50 Units into the skin See admin instructions. Inject 50 units with breakfast, 30 units with lunch, and 50 units with dinner. Inject 1-10 units with snacks   hydrOXYzine 25 MG tablet Commonly known as: ATARAX Take 25 mg by mouth every 8 (eight) hours as needed for itching.   ipratropium-albuterol 0.5-2.5 (3) MG/3ML Soln Commonly known as: DUONEB Take 3 mLs by nebulization every 6 (six) hours as needed (shortness of breath).   Jardiance 10 MG Tabs tablet Generic drug: empagliflozin Take 10 mg by mouth every morning.   levothyroxine 88 MCG tablet Commonly known as: SYNTHROID Take 88 mcg by mouth daily before breakfast.   lubiprostone 8 MCG capsule Commonly known as: AMITIZA TAKE 1 CAPSULE(8 MCG) BY MOUTH TWICE DAILY AS NEEDED FOR CONSTIPATION What changed:  how much to take when to take this reasons to take this additional instructions   Lyrica 75 MG capsule Generic drug: pregabalin Take 75 mg by mouth 2 (two) times daily.   methocarbamol 500 MG tablet Commonly known as: Robaxin Take 1 tablet (500 mg total) by mouth 3 (three) times daily as needed. What changed: reasons to take this   montelukast 10 MG  tablet Commonly known as: SINGULAIR Take 1 tablet by mouth every evening.   omega-3 acid ethyl esters 1 g capsule Commonly known as: LOVAZA Take 2 capsules (2 g total) by mouth 2 (two) times daily.   pantoprazole 40 MG tablet Commonly known as: PROTONIX Take 1 tablet (40 mg total) by mouth daily.   potassium chloride 10 MEQ tablet Commonly known as: KLOR-CON M Take 10 mEq by mouth daily.   Restasis 0.05 % ophthalmic emulsion Generic drug: cycloSPORINE Place 1 drop into both eyes 2 (two) times daily.   torsemide 20 MG tablet Commonly known as: DEMADEX Take 2 tablets (40 mg total) by mouth daily. What changed:  how much to take when to take this reasons to take this   traMADol 50 MG tablet Commonly  known as: ULTRAM Take 50 mg by mouth 3 (three) times daily as needed for moderate pain.   traZODone 50 MG tablet Commonly known as: DESYREL Take 50-100 mg by mouth at bedtime as needed for sleep.   vitamin B-12 1000 MCG tablet Commonly known as: CYANOCOBALAMIN Take 1,000 mcg by mouth daily.   vitamin C 1000 MG tablet Take 1,000 mg by mouth daily.   Vitamin D 50 MCG (2000 UT) tablet Take 4,000 Units by mouth daily.   Xultophy 100-3.6 UNIT-MG/ML Sopn Generic drug: Insulin Degludec-Liraglutide Inject 85 Units as directed at bedtime.        Follow-up Information     [facility] Follow up.   Specialty: Rehabilitation Why: The outpatient rehab will contact you for the first appointment Contact information: [id number] [place] [address] [phone]               Discharge Exam: Filed Weights   05/16/21 2304 05/17/21 1610  Weight: 101.6 kg 109.6 kg   BP (!) 120/97 (BP Location: Right Arm)   Pulse 90   Temp 99 F (37.2 C) (Oral)   Resp 20   Ht 5\' 8"  (1.727 m)   Wt 109.6 kg   SpO2 99%   BMI 36.75 kg/m   Patient feeling better, neurologic deficit has improved.   Neurology  awake and alert ENT with no pallor Cardiovascular with S1 and S2 present and rhythmic Respiratory lungs clear to auscultation Abdomen soft No lower extremity edema   Condition at discharge: stable  The results of significant diagnostics from this hospitalization (including imaging, microbiology, ancillary and laboratory) are listed below for reference.   Imaging Studies: MR ANGIO HEAD WO CONTRAST  Result Date: 05/17/2021 CLINICAL DATA:  Shortness of breath, left hand weakness, stroke suspected EXAM: MRI HEAD WITHOUT CONTRAST MRA HEAD WITHOUT CONTRAST TECHNIQUE: Multiplanar, multi-echo pulse sequences of the brain and surrounding structures were acquired without intravenous contrast. Angiographic images of the Circle of Willis were acquired using MRA technique without intravenous contrast. COMPARISON:  09/15/2017 MRI, correlation is also made with 05/17/2021 CT head FINDINGS: Evaluation is somewhat limited by motion artifact. MRI HEAD FINDINGS Brain: Restricted diffusion with ADC correlate in the right precentral gyrus (series 5, images 83-87 and series 6, images 33-37), including restricted diffusion involving the "hand" knob. Additional restricted diffusion with ADC correlate in the right frontal lobe white matter (series 5, images 79-80 and series 8, images 20 9-30). An additional focus of increased signal on diffusion-weighted imaging in the right insula (series 5, image 73) is associated with mildly decreased signal on ADC map, possibly reflecting some normalization (series 6, image 23). These areas are associated with increased T2 hyperintense signal. No acute hemorrhage, mass, mass effect, or midline shift. No hydrocephalus or extra-axial collection. Scattered T2 hyperintense signal in the periventricular white matter, likely the sequela of mild chronic small vessel ischemic disease. Vascular: Normal flow voids. Skull and upper cervical spine: Normal marrow signal. Sinuses/Orbits: No acute or  significant finding. Status post bilateral lens replacements. Other: The mastoids are well aerated. MRA HEAD FINDINGS Evaluation is limited by motion artifact. Anterior circulation: Both internal carotid arteries are patent to the termini, without significant stenosis. A1 segments patent. Normal anterior communicating artery. Anterior cerebral arteries are patent to their distal aspects. Possible mild narrowing at the right M1 origin (series 5, image 100), although this is favored to represent motion artifact. No left M1 stenosis or occlusion. Normal MCA  bifurcations. Distal MCA branches perfused and symmetric. Posterior circulation: Vertebral arteries patent to the vertebrobasilar junction without stenosis. Basilar patent to its distal aspect. Superior cerebellar arteries patent bilaterally. Patent P1 segments. PCAs perfused to their distal aspects without stenosis. The bilateral posterior communicating arteries are not visualized. Anatomic variants: None significant IMPRESSION: 1. Evaluation is limited by motion artifact. 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 Additional infarct involving the right insula may be subacute, given some normalization of the ADC. 2. Evaluation is significantly limited by motion artifact. Within this limitation, no intracranial large vessel occlusion or significant stenosis. These results will be called to the ordering clinician or representative by the Radiologist Assistant, and communication documented in the PACS or [employer]. Electronically Signed   By: Wiliam Ke M.D.   On: 05/17/2021 18:22   MR BRAIN WO CONTRAST  Result Date: 05/17/2021 CLINICAL DATA:  Shortness of breath, left hand weakness, stroke suspected EXAM: MRI HEAD WITHOUT CONTRAST MRA HEAD WITHOUT CONTRAST TECHNIQUE: Multiplanar, multi-echo pulse sequences of the brain and surrounding structures  were acquired without intravenous contrast. Angiographic images of the Circle of Willis were acquired using MRA technique without intravenous contrast. COMPARISON:  09/15/2017 MRI, correlation is also made with 05/17/2021 CT head FINDINGS: Evaluation is somewhat limited by motion artifact. MRI HEAD FINDINGS Brain: Restricted diffusion with ADC correlate in the right precentral gyrus (series 5, images 83-87 and series 6, images 33-37), including restricted diffusion involving the "hand" knob. Additional restricted diffusion with ADC correlate in the right frontal lobe white matter (series 5, images 79-80 and series 8, images 20 9-30). An additional focus of increased signal on diffusion-weighted imaging in the right insula (series 5, image 73) is associated with mildly decreased signal on ADC map, possibly reflecting some normalization (series 6, image 23). These areas are associated with increased T2 hyperintense signal. No acute hemorrhage, mass, mass effect, or midline shift. No hydrocephalus or extra-axial collection. Scattered T2 hyperintense signal in the periventricular white matter, likely the sequela of mild chronic small vessel ischemic disease. Vascular: Normal flow voids. Skull and upper cervical spine: Normal marrow signal. Sinuses/Orbits: No acute or significant finding. Status post bilateral lens replacements. Other: The mastoids are well aerated. MRA HEAD FINDINGS Evaluation is limited by motion artifact. Anterior circulation: Both internal carotid arteries are patent to the termini, without significant stenosis. A1 segments patent. Normal anterior communicating artery. Anterior cerebral arteries are patent to their distal aspects. Possible mild narrowing at the right M1 origin (series 5, image 100), although this is favored to represent motion artifact. No left M1 stenosis or occlusion. Normal MCA bifurcations. Distal MCA branches perfused and symmetric. Posterior circulation: Vertebral arteries  patent to the vertebrobasilar junction without stenosis. Basilar patent to its distal aspect. Superior cerebellar arteries patent bilaterally. Patent P1 segments. PCAs perfused to their distal aspects without stenosis. The bilateral posterior communicating arteries are not visualized. Anatomic variants: None significant IMPRESSION: 1. Evaluation is limited by motion artifact. 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 Additional infarct involving the right insula may be subacute, given some normalization of the ADC. 2. Evaluation is significantly limited by motion artifact. Within this limitation, no intracranial large vessel occlusion or significant stenosis. These results will be called to the ordering clinician or representative by the Radiologist Assistant, and communication documented in the PACS or  [employer]. Electronically Signed   By: Wiliam Ke M.D.   On: 05/17/2021 18:22   US Carotid Bilateral  Result Date: 05/17/2021 CLINICAL DATA:  Left hand weakness. Hypertension. TIA. Hyperlipidemia, diabetes, tobacco use. EXAM: BILATERAL CAROTID DUPLEX ULTRASOUND TECHNIQUE: Wallace Cullens scale imaging, color Doppler and duplex ultrasound were performed of bilateral carotid and vertebral arteries in the neck. COMPARISON:  None. FINDINGS: Criteria: Quantification of carotid stenosis is based on velocity parameters that correlate the residual internal carotid diameter with NASCET-based stenosis levels, using the diameter of the distal internal carotid lumen as the denominator for stenosis measurement. The following velocity measurements were obtained: RIGHT ICA: 65 cm/sec CCA: 66 cm/sec SYSTOLIC ICA/CCA RATIO:  0.8 ECA: 65 cm/sec LEFT ICA: 75 cm/sec CCA: 75 cm/sec SYSTOLIC ICA/CCA RATIO:  1 ECA: 93 cm/sec RIGHT CAROTID ARTERY: No significant plaque identified. RIGHT VERTEBRAL ARTERY:  Antegrade flow. LEFT  CAROTID ARTERY:  No significant plaque identified. LEFT VERTEBRAL ARTERY:  Antegrade flow. IMPRESSION: 1. No cause for the patient's symptoms identified.  Normal study. Electronically Signed   By: Gerome Sam III M.D.   On: 05/17/2021 10:35   DG Chest Port 1 View  Result Date: 05/17/2021 CLINICAL DATA:  Shortness of breath.  Wheezing. EXAM: PORTABLE CHEST 1 VIEW COMPARISON:  03/21/2021 FINDINGS: Cardiac enlargement. Mild vascular congestion with central interstitial prominence, possibly early edema. No focal consolidation. No pleural effusions. No pneumothorax. Mediastinal contours appear intact. Degenerative changes in the spine. Postoperative change in the right shoulder. IMPRESSION: Cardiac enlargement with bowel gas congestion and central interstitial prominence, possibly early edema. Electronically Signed   By: Burman Nieves M.D.   On: 05/17/2021 01:19   VAS [place] TRANSCRANIAL DOPPLER W BUBBLES  Result Date: 05/20/2021  Transcranial Doppler with Bubble Patient Name:  Stephanie [name]  Date of Exam:   05/19/2021 Medical Rec #: [id number]    Accession #:    [id number] Date of Birth: Mar 06, 1950    Patient Gender: F Patient Age:   23 years Exam Location:  [facility] Procedure:      VAS [place] TRANSCRANIAL DOPPLER W BUBBLES Referring Phys: Dewitt Hoes DE LA TORRE --------------------------------------------------------------------------------  Indications: Stroke. Comparison Study: no prior Performing Technologist: [place] Ponder RVS  Examination Guidelines: A complete evaluation includes B-mode imaging, spectral Doppler, color Doppler, and power Doppler as needed of all accessible portions of each vessel. Bilateral testing is considered an integral part of a complete examination. Limited examinations for reoccurring indications may be performed as noted.  Summary: No HITS at rest or during Valsalva. Negative transcranial Doppler Bubble study with no evidence of right to left intracardiac communication.  A  vascular evaluation was performed. The right opthalmic artery was studied. An IV was inserted into the patient's left forearm . Verbal informed consent was obtained.  Negative TCD Bubble study.Study limited by absent bitemporal windows and poor valasalva effort *See table(s) above for TCD measurements and observations.  Diagnosing physician: Delia Heady MD Electronically signed by Delia Heady MD on 05/20/2021 at 4:55:35 PM.    Final    ECHOCARDIOGRAM COMPLETE  Result Date: 05/17/2021    ECHOCARDIOGRAM REPORT   Patient Name:   Stephanie [name] Date of Exam: 05/17/2021 Medical Rec #:  [id number]   Height:       68.0 in Accession #:    [id number]  Weight:       224.0 lb Date of Birth:  25-Jul-1950   BSA:          2.145 m Patient Age:  70 years    BP:           145/88 mmHg Patient Gender: F           HR:           90 bpm. Exam Location:  [facility] Procedure: 2D Echo, Cardiac Doppler and Color Doppler Indications:    Stroke  History:        Patient has prior history of Echocardiogram examinations, most                 recent 06/28/2020. CHF, TIA and COPD, Signs/Symptoms:Dyspnea;                 Risk Factors:Hypertension and Diabetes.  Sonographer:    Mikki Harbor Referring Phys: [id number] ASIA B ZIERLE-GHOSH IMPRESSIONS  1. Poor acoustic windows limit study INferoseptal wall appears mildly hyypokinetic no significant change in overall LVEF . Left ventricular ejection fraction, by estimation, is 45 to 50%. The left ventricle has mildly decreased function. Left ventricular diastolic parameters are consistent with Grade II diastolic dysfunction (pseudonormalization). Elevated left atrial pressure.  2. Right ventricular systolic function is normal. The right ventricular size is normal. There is mildly elevated pulmonary artery systolic pressure.  3. Right atrial size was mildly dilated.  4. The mitral valve is normal in structure. Mild mitral valve regurgitation.  5. The aortic valve is normal in structure. Aortic valve  regurgitation is not visualized.  6. The inferior vena cava is normal in size with greater than 50% respiratory variability, suggesting right atrial pressure of 3 mmHg. FINDINGS  Left Ventricle: Poor acoustic windows limit study INferoseptal wall appears mildly hyypokinetic no significant change in overall LVEF. Left ventricular ejection fraction, by estimation, is 45 to 50%. The left ventricle has mildly decreased function. The  left ventricular internal cavity size was normal in size. There is no left ventricular hypertrophy. Left ventricular diastolic parameters are consistent with Grade II diastolic dysfunction (pseudonormalization). Elevated left atrial pressure. Right Ventricle: The right ventricular size is normal. Right vetricular wall thickness was not assessed. Right ventricular systolic function is normal. There is mildly elevated pulmonary artery systolic pressure. The tricuspid regurgitant velocity is 2.96 m/s, and with an assumed right atrial pressure of 8 mmHg, the estimated right ventricular systolic pressure is 43.0 mmHg. Left Atrium: Left atrial size was normal in size. Right Atrium: Right atrial size was mildly dilated. Pericardium: There is no evidence of pericardial effusion. Mitral Valve: The mitral valve is normal in structure. Mild mitral valve regurgitation. MV peak gradient, 5.7 mmHg. The mean mitral valve gradient is 2.0 mmHg. Tricuspid Valve: The tricuspid valve is normal in structure. Tricuspid valve regurgitation is mild. Aortic Valve: The aortic valve is normal in structure. Aortic valve regurgitation is not visualized. Aortic valve mean gradient measures 3.0 mmHg. Aortic valve peak gradient measures 5.7 mmHg. Aortic valve area, by VTI measures 2.68 cm. Pulmonic Valve: The pulmonic valve was not well visualized. Pulmonic valve regurgitation is mild. No evidence of pulmonic stenosis. Aorta: The aortic root is normal in size and structure. Venous: The inferior vena cava is normal in  size with greater than 50% respiratory variability, suggesting right atrial pressure of 3 mmHg. IAS/Shunts: No atrial level shunt detected by color flow Doppler. Additional Comments: There is pleural effusion in the left lateral region.  LEFT VENTRICLE PLAX 2D LVIDd:         4.90 cm     Diastology LVIDs:  4.00 cm     LV e' medial:    4.03 cm/s LV PW:         0.90 cm     LV E/e' medial:  28.3 LV IVS:        1.15 cm     LV e' lateral:   8.81 cm/s LVOT diam:     1.90 cm     LV E/e' lateral: 12.9 LV SV:         65 LV SV Index:   30 LVOT Area:     2.84 cm  LV Volumes (MOD) LV vol d, MOD A2C: 69.8 ml LV vol d, MOD A4C: 87.2 ml LV vol s, MOD A2C: 40.9 ml LV vol s, MOD A4C: 42.9 ml LV SV MOD A2C:     28.9 ml LV SV MOD A4C:     87.2 ml LV SV MOD BP:      38.3 ml RIGHT VENTRICLE RV Basal diam:  4.00 cm RV Mid diam:    4.00 cm RV S prime:     11.20 cm/s TAPSE (M-mode): 2.1 cm LEFT ATRIUM             Index        RIGHT ATRIUM           Index LA diam:        4.40 cm 2.05 cm/m   RA Area:     20.10 cm LA Vol (A2C):   58.5 ml 27.28 ml/m  RA Volume:   62.50 ml  29.14 ml/m LA Vol (A4C):   62.0 ml 28.91 ml/m LA Biplane Vol: 62.1 ml 28.96 ml/m  AORTIC VALVE                    PULMONIC VALVE AV Area (Vmax):    2.38 cm     PV Vmax:       0.69 m/s AV Area (Vmean):   2.42 cm     PV Peak grad:  1.9 mmHg AV Area (VTI):     2.68 cm AV Vmax:           119.00 cm/s AV Vmean:          76.500 cm/s AV VTI:            0.241 m AV Peak Grad:      5.7 mmHg AV Mean Grad:      3.0 mmHg LVOT Vmax:         99.90 cm/s LVOT Vmean:        65.300 cm/s LVOT VTI:          0.228 m LVOT/AV VTI ratio: 0.95  AORTA Ao Root diam: 2.70 cm Ao Asc diam:  3.20 cm MITRAL VALVE                TRICUSPID VALVE MV Area (PHT): 4.93 cm     TR Peak grad:   35.0 mmHg MV Area VTI:   2.65 cm     TR Vmax:        296.00 cm/s MV Peak grad:  5.7 mmHg MV Mean grad:  2.0 mmHg     SHUNTS MV Vmax:       1.19 m/s     Systemic VTI:  0.23 m MV Vmean:      72.4 cm/s     Systemic Diam: 1.90 cm MV Decel Time: 154 msec MV E velocity: 114.00 cm/s MV A velocity: 87.80 cm/s MV E/A ratio:  1.30  Dietrich Pates MD Electronically signed by Dietrich Pates MD Signature Date/Time: 05/17/2021/12:41:00 PM    Final    CT HEAD CODE STROKE WO CONTRAST  Result Date: 05/17/2021 CLINICAL DATA:  Code stroke.  Initial evaluation for acute stroke. EXAM: CT HEAD WITHOUT CONTRAST TECHNIQUE: Contiguous axial images were obtained from the base of the skull through the vertex without intravenous contrast. RADIATION DOSE REDUCTION: This exam was performed according to the departmental dose-optimization program which includes automated exposure control, adjustment of the mA and/or kV according to patient size and/or use of iterative reconstruction technique. COMPARISON:  Prior MRI from 09/15/2017. FINDINGS: Brain: Cerebral volume within normal limits. No acute intracranial hemorrhage. No acute large vessel territory infarct. No mass lesion or midline shift. No hydrocephalus or extra-axial fluid collection. Vascular: No hyperdense vessel. Scattered vascular calcifications noted within the carotid siphons. Skull: Scalp soft tissues and calvarium demonstrate no acute finding. Sinuses/Orbits: Globes orbital soft tissues within normal limits. Paranasal sinuses are clear. No mastoid effusion. Other: None. ASPECTS [facility] Stroke Program Early CT Score) - Ganglionic level infarction (caudate, lentiform nuclei, internal capsule, insula, M1-M3 cortex): 7 - Supraganglionic infarction (M4-M6 cortex): 3 Total score (0-10 with 10 being normal): 10 IMPRESSION: 1. Negative head CT.  No acute intracranial abnormality. 2. ASPECTS is 10 Results were called by telephone at the time of interpretation on 05/17/2021 at 12:53 am to provider Geoffery Lyons , who verbally acknowledged these results. Electronically Signed   By: Rise Mu M.D.   On: 05/17/2021 00:54   VAS [place] LOWER EXTREMITY VENOUS (DVT)  Result Date: 05/20/2021  Lower  Venous DVT Study Patient Name:  Stephanie [name]  Date of Exam:   05/19/2021 Medical Rec #: [id number]    Accession #:    [id number] Date of Birth: 09-23-1950    Patient Gender: F Patient Age:   40 years Exam Location:  [facility] Procedure:      VAS [place] LOWER EXTREMITY VENOUS (DVT) Referring Phys: Dewitt Hoes DE LA TORRE --------------------------------------------------------------------------------  Indications: Pain.  Limitations: Poor ultrasound/tissue interface. Comparison Study: no prior Performing Technologist: [place] Ponder RVS  Examination Guidelines: A complete evaluation includes B-mode imaging, spectral Doppler, color Doppler, and power Doppler as needed of all accessible portions of each vessel. Bilateral testing is considered an integral part of a complete examination. Limited examinations for reoccurring indications may be performed as noted. The reflux portion of the exam is performed with the patient in reverse Trendelenburg.  +---------+---------------+---------+-----------+----------+------------------+ RIGHT    CompressibilityPhasicitySpontaneityPropertiesThrombus Aging     +---------+---------------+---------+-----------+----------+------------------+ CFV      Full           Yes      Yes                                     +---------+---------------+---------+-----------+----------+------------------+ SFJ      Full                                                            +---------+---------------+---------+-----------+----------+------------------+ FV Prox  Full                                                            +---------+---------------+---------+-----------+----------+------------------+  FV Mid                  Yes      Yes                                     +---------+---------------+---------+-----------+----------+------------------+ FV Distal               Yes      Yes                                      +---------+---------------+---------+-----------+----------+------------------+ PFV      Full                                                            +---------+---------------+---------+-----------+----------+------------------+ POP      Full           Yes      Yes                                     +---------+---------------+---------+-----------+----------+------------------+ PTV      Full                                         limited                                                                  visualization      +---------+---------------+---------+-----------+----------+------------------+ PERO                                                  Not well                                                                 visualized         +---------+---------------+---------+-----------+----------+------------------+   +---------+---------------+---------+-----------+----------+-------------------+ LEFT     CompressibilityPhasicitySpontaneityPropertiesThrombus Aging      +---------+---------------+---------+-----------+----------+-------------------+ CFV      Full           Yes      Yes                                      +---------+---------------+---------+-----------+----------+-------------------+ SFJ      Full                                                             +---------+---------------+---------+-----------+----------+-------------------+  FV Prox  Full                                                             +---------+---------------+---------+-----------+----------+-------------------+ FV Mid   Full           Yes      Yes                                      +---------+---------------+---------+-----------+----------+-------------------+ FV Distal               Yes      Yes                                      +---------+---------------+---------+-----------+----------+-------------------+ PFV       Full                                                             +---------+---------------+---------+-----------+----------+-------------------+ POP      Full           Yes      Yes                                      +---------+---------------+---------+-----------+----------+-------------------+ PTV                                                   Not well visualized +---------+---------------+---------+-----------+----------+-------------------+ PERO                                                  Not well visualized +---------+---------------+---------+-----------+----------+-------------------+     Summary: RIGHT: - There is no evidence of deep vein thrombosis in the lower extremity. However, portions of this examination were limited- see technologist comments above.  - No cystic structure found in the popliteal fossa.  LEFT: - There is no evidence of deep vein thrombosis in the lower extremity. However, portions of this examination were limited- see technologist comments above.  - No cystic structure found in the popliteal fossa.  *See table(s) above for measurements and observations. Electronically signed by Waverly Ferrari MD on 05/20/2021 at 1:23:37 PM.    Final     Microbiology: Results for orders placed or performed during the hospital encounter of 05/16/21  Resp Panel by RT-PCR (Flu A&B, Covid) Nasopharyngeal Swab     Status: None   Collection Time: 05/17/21 12:21 AM   Specimen: Nasopharyngeal Swab; Nasopharyngeal(NP) swabs in vial transport medium  Result Value Ref Range Status   SARS Coronavirus 2 by RT PCR NEGATIVE NEGATIVE Final    Comment: (NOTE) SARS-CoV-2 target  nucleic acids are NOT DETECTED.  The SARS-CoV-2 RNA is generally detectable in upper respiratory specimens during the acute phase of infection. The lowest concentration of SARS-CoV-2 viral copies this assay can detect is 138 copies/mL. A negative result does not preclude SARS-Cov-2 infection  and should not be used as the sole basis for treatment or other patient management decisions. A negative result may occur with  improper specimen collection/handling, submission of specimen other than nasopharyngeal swab, presence of viral mutation(s) within the areas targeted by this assay, and inadequate number of viral copies(<138 copies/mL). A negative result must be combined with clinical observations, patient history, and epidemiological information. The expected result is Negative.  Fact Sheet for Patients:  BloggerCourse.com  Fact Sheet for Healthcare Providers:  SeriousBroker.it  This test is no t yet approved or cleared by the [place] FDA and  has been authorized for detection and/or diagnosis of SARS-CoV-2 by FDA under an Emergency Use Authorization (EUA). This EUA will remain  in effect (meaning this test can be used) for the duration of the COVID-19 declaration under Section 564(b)(1) of the Act, 21 U.S.C.section 360bbb-3(b)(1), unless the authorization is terminated  or revoked sooner.       Influenza A by PCR NEGATIVE NEGATIVE Final   Influenza B by PCR NEGATIVE NEGATIVE Final    Comment: (NOTE) The Xpert Xpress SARS-CoV-2/FLU/RSV plus assay is intended as an aid in the diagnosis of influenza from Nasopharyngeal swab specimens and should not be used as a sole basis for treatment. Nasal washings and aspirates are unacceptable for Xpert Xpress SARS-CoV-2/FLU/RSV testing.  Fact Sheet for Patients: BloggerCourse.com  Fact Sheet for Healthcare Providers: SeriousBroker.it  This test is not yet approved or cleared by the [place] FDA and has been authorized for detection and/or diagnosis of SARS-CoV-2 by FDA under an Emergency Use Authorization (EUA). This EUA will remain in effect (meaning this test can be used) for the duration of the COVID-19 declaration  under Section 564(b)(1) of the Act, 21 U.S.C. section 360bbb-3(b)(1), unless the authorization is terminated or revoked.  Performed at [facility], [address] [place], [place] [address]     Labs: CBC: Recent Labs  Lab 05/17/21 0023 05/17/21 0629 05/18/21 0956  WBC 10.4 11.4* 9.2  NEUTROABS 7.9*  --  6.2  HGB 12.4 12.6 13.0  HCT 42.1 40.2 40.9  MCV 88.1 88.0 84.7  PLT 237 234 244   Basic Metabolic Panel: Recent Labs  Lab 05/17/21 0023 05/17/21 0629 05/18/21 0956 05/19/21 0851 05/20/21 0642  NA 135 137 138 140 141  K 3.5 3.7 3.4* 3.7 3.7  CL 106 106 105 105 103  CO2 23 23 26 28 31   GLUCOSE 152* 178* 64* 83 100*  BUN 19 17 13 13 14   CREATININE 1.19* 0.98 1.12* 1.05* 1.07*  CALCIUM 8.6* 8.8* 8.9 9.1 8.7*   Liver Function Tests: Recent Labs  Lab 05/17/21 0023 05/17/21 0629  AST 30 27  ALT 35 33  ALKPHOS 351* 350*  BILITOT 0.7 0.8  PROT 7.7 7.6  ALBUMIN 2.6* 2.6*   CBG: Recent Labs  Lab 05/20/21 1617 05/20/21 2134 05/21/21 0612 05/21/21 1201 05/21/21 1228  GLUCAP 196* 222* 189* 224* 261*    Discharge time spent: greater than 30 minutes.  Signed: Coralie Keens, MD [facility] 05/21/2021

## 2021-05-21 NOTE — Care Management Important Message (Signed)
Important Message ? ?Patient Details  ?Name: Stephanie [name] ?MRN: [id number] ?Date of Birth: May 08, 1950 ? ? ?Medicare Important Message Given:  Yes ? ? ? ? ?Terrace Chiem ?05/21/2021, 3:47 PM ?

## 2021-05-21 NOTE — TOC Transition Note (Signed)
Transition of Care (TOC) - CM/SW Discharge Note ? ? ?Patient Details  ?Name: Gregoria Selvy Seat ?MRN: [id number] ?Date of Birth: 31-May-1950 ? ?Transition of Care (TOC) CM/SW Contact:  ?Pollie Friar, RN ?Phone Number: ?05/21/2021, 1:58 PM ? ? ?Clinical Narrative:    ?Patient is discharging home with outpatient therapy through [facility]. Information on the AVS.  ?Pt has supervision at home and transportation to home.  ? ? ?Final next level of care: OP Rehab ?Barriers to Discharge: No Barriers Identified ? ? ?Patient Goals and CMS Choice ?  ?  ?Choice offered to / list presented to : Patient ? ?Discharge Placement ?  ?           ?  ?  ?  ?  ? ?Discharge Plan and Services ?  ?Discharge Planning Services: CM Consult ?           ?  ?  ?  ?  ?  ?  ?  ?  ?  ?  ? ?Social Determinants of Health (SDOH) Interventions ?  ? ? ?Readmission Risk Interventions ?   ? View : No data to display.  ?  ?  ?  ? ? ? ? ? ?

## 2021-05-21 NOTE — Progress Notes (Signed)
Occupational Therapy Treatment ?Patient Details ?Name: Stephanie [name] ?MRN: [id number] ?DOB: [birth year] ?Today's Date: 05/21/2021 ? ? ?History of present illness 71 y.o. female with medical history significant of with history of COPD, DVT on Eliquis, GERD, hyperlipidemia, hypertension, hypothyroidism, type 2 diabetes mellitus, TIA, and more presents to the ED with a chief complaint of dyspnea and L hand weakness.  MRI is pending 4/1. ?  ?OT comments ? Patient making good progress with OT treatment. Patient able to get to EOB without assistance and ambulated to sink for grooming with supervision and was able to stand for all grooming tasks. Patient instructed in yellow therapy putty exercises to increase LUE [facility] with patient demonstrating good understanding of exercises. Patient is expected to discharge home with daughter.  Acute OT to continue to follow while in this setting.   ? ?Recommendations for follow up therapy are one component of a multi-disciplinary discharge planning process, led by the attending physician.  Recommendations may be updated based on patient status, additional functional criteria and insurance authorization. ?   ?Follow Up Recommendations ? Outpatient OT  ?  ?Assistance Recommended at Discharge Intermittent Supervision/Assistance  ?Patient can return home with the following ? A little help with walking and/or transfers;A little help with bathing/dressing/bathroom;Assistance with cooking/housework ?  ?Equipment Recommendations ? Tub/shower seat  ?  ?Recommendations for Other Services   ? ?  ?Precautions / Restrictions Precautions ?Precautions: None ?Restrictions ?Weight Bearing Restrictions: No  ? ? ?  ? ?Mobility Bed Mobility ?Overal bed mobility: Modified Independent ?  ?  ?  ?  ?  ?  ?General bed mobility comments: able to get to EOB without assistance and completed session up in chair ?  ? ?Transfers ?Overall transfer level: Needs assistance ?Equipment used: None ?Transfers: Sit to/from Stand,  Bed to chair/wheelchair/BSC ?Sit to Stand: Supervision ?  ?  ?Step pivot transfers: Supervision ?  ?  ?General transfer comment: supervision for mobility and transfers without an assistive device ?  ?  ?Balance Overall balance assessment: Mild deficits observed, not formally tested ?  ?  ?  ?  ?  ?  ?  ?  ?  ?  ?  ?  ?  ?  ?  ?  ?  ?  ?   ? ?ADL either performed or assessed with clinical judgement  ? ?ADL Overall ADL's : Needs assistance/impaired ?  ?  ?Grooming: Wash/dry hands;Wash/dry face;Oral care;Supervision/safety;Standing ?Grooming Details (indicate cue type and reason): performed standing at sink without seated rest break ?  ?  ?  ?  ?  ?  ?  ?  ?  ?  ?  ?  ?  ?  ?  ?General ADL Comments: she didn't think she could perform standing due to back spasms but was able to complete grooming withou seated break ?  ? ?Extremity/Trunk Assessment Upper Extremity Assessment ?LUE Deficits / Details: increased grip and movement ?LUE Sensation: WNL ?  ?  ?  ?  ?  ? ?Vision   ?  ?  ?Perception   ?  ?Praxis   ?  ? ?Cognition Arousal/Alertness: Awake/alert ?Behavior During Therapy: [facility] for tasks assessed/performed ?Overall Cognitive Status: Within Functional Limits for tasks assessed ?  ?  ?  ?  ?  ?  ?  ?  ?  ?  ?  ?  ?  ?  ?  ?  ?General Comments: alert and oriented ?  ?  ?   ?Exercises Exercises: Hand  exercises ?Hand Exercises ?Digit Composite Flexion: Strengthening, Left, Other (comment) (yellow putty) ?Composite Extension: Strengthening, Left, Other (comment) (yellow putty) ?Digit Composite Abduction: Strengthening, Left, Other (comment) (yellow putty) ? ?  ?Shoulder Instructions   ? ? ?  ?General Comments    ? ? ?Pertinent Vitals/ Pain       Pain Assessment ?Pain Assessment: Faces ?Faces Pain Scale: Hurts a little bit ?Pain Location: back ?Pain Descriptors / Indicators: Spasm ?Pain Intervention(s): Limited activity within patient's tolerance, Monitored during session, Repositioned ? ?Home Living   ?  ?  ?  ?  ?  ?  ?  ?   ?  ?  ?  ?  ?  ?  ?  ?  ?  ?  ? ?  ?Prior Functioning/Environment    ?  ?  ?  ?   ? ?Frequency ? Min 2X/week  ? ? ? ? ?  ?Progress Toward Goals ? ?OT Goals(current goals can now be found in the care plan section) ? Progress towards OT goals: Progressing toward goals ? ?Acute Rehab OT Goals ?Patient Stated Goal: go home ?OT Goal Formulation: With patient ?Time For Goal Achievement: 05/31/21 ?Potential to Achieve Goals: Good ?ADL Goals ?Pt Will Perform Grooming: with modified independence;standing ?Pt Will Perform Lower Body Dressing: with modified independence;sit to/from stand ?Pt Will Transfer to Toilet: with modified independence;ambulating;regular height toilet  ?Plan Discharge plan remains appropriate   ? ?Co-evaluation ? ? ?   ?  ?  ?  ?  ? ?  ?AM-PAC OT "6 Clicks" Daily Activity     ?Outcome Measure ? ? Help from another person eating meals?: None ?Help from another person taking care of personal grooming?: None ?Help from another person toileting, which includes using toliet, bedpan, or urinal?: A Little ?Help from another person bathing (including washing, rinsing, drying)?: A Little ?Help from another person to put on and taking off regular upper body clothing?: A Little ?Help from another person to put on and taking off regular lower body clothing?: A Little ?6 Click Score: 20 ? ?  ?End of Session   ? ?OT Visit Diagnosis: Hemiplegia and hemiparesis ?Hemiplegia - Right/Left: Left ?Hemiplegia - dominant/non-dominant: Non-Dominant ?Hemiplegia - caused by: Unspecified ?  ?Activity Tolerance Patient tolerated treatment well ?  ?Patient Left in chair;with call bell/phone within reach ?  ?Nurse Communication Mobility status ?  ? ?   ? ?Time: 2080-2233 ?OT Time Calculation (min): 23 min ? ?Charges: OT General Charges ?$OT Visit: 1 Visit ?OT Treatments ?$Self Care/Home Management : 8-22 mins ?$Therapeutic Exercise: 8-22 mins ? ?Lodema Hong, OTA ?Acute Rehabilitation Services  ?Pager [phone] ?Office  [phone] ? ? ?Trixie Dredge ?05/21/2021, 8:37 AM ?

## 2021-05-21 NOTE — Plan of Care (Signed)
?  Problem: Education: ?Goal: Knowledge of General Education information will improve ?Description: Including pain rating scale, medication(s)/side effects and non-pharmacologic comfort measures ?Outcome: Progressing ?  ?Problem: Health Behavior/Discharge Planning: ?Goal: Ability to manage health-related needs will improve ?Outcome: Progressing ?  ?Problem: Clinical Measurements: ?Goal: Ability to maintain clinical measurements within normal limits will improve ?Outcome: Progressing ?Goal: Will remain free from infection ?Outcome: Progressing ?Goal: Diagnostic test results will improve ?Outcome: Progressing ?Goal: Respiratory complications will improve ?Outcome: Progressing ?Goal: Cardiovascular complication will be avoided ?Outcome: Progressing ?  ?Problem: Activity: ?Goal: Risk for activity intolerance will decrease ?Outcome: Progressing ?  ?Problem: Nutrition: ?Goal: Adequate nutrition will be maintained ?Outcome: Progressing ?  ?Problem: Coping: ?Goal: Level of anxiety will decrease ?Outcome: Progressing ?  ?Problem: Elimination: ?Goal: Will not experience complications related to bowel motility ?Outcome: Progressing ?Goal: Will not experience complications related to urinary retention ?Outcome: Progressing ?  ?Problem: Pain Managment: ?Goal: General experience of comfort will improve ?Outcome: Progressing ?  ?Problem: Safety: ?Goal: Ability to remain free from injury will improve ?Outcome: Progressing ?  ?Problem: Skin Integrity: ?Goal: Risk for impaired skin integrity will decrease ?Outcome: Progressing ?  ?Problem: Education: ?Goal: Knowledge of disease or condition will improve ?Outcome: Progressing ?  ?Problem: Health Behavior/Discharge Planning: ?Goal: Ability to manage health-related needs will improve ?Outcome: Progressing ?  ?Problem: Self-Care: ?Goal: Ability to participate in self-care as condition permits will improve ?Outcome: Progressing ?  ?Problem: Ischemic Stroke/TIA Tissue Perfusion: ?Goal:  Complications of ischemic stroke/TIA will be minimized ?Outcome: Progressing ?  ?

## 2021-05-21 NOTE — Discharge Instructions (Signed)
Information on my medicine - ELIQUIS? (apixaban) ? ?This medication education was reviewed with me or my healthcare representative as part of my discharge preparation.  The pharmacist that spoke with me during my hospital stay was:  ? ?Why was Eliquis? prescribed for you? ?Eliquis? was prescribed to treat blood clots that may have been found in the veins of your legs (deep vein thrombosis) or in your lungs (pulmonary embolism) and to reduce the risk of them occurring again. ? ?What do You need to know about Eliquis? ? ?Continue Eliquis 5 mg tablet taken TWICE daily.  Eliquis? may be taken with or without food.  ? ?Try to take the dose about the same time in the morning and in the evening. If you have difficulty swallowing the tablet whole please discuss with your pharmacist how to take the medication safely. ? ?Take Eliquis? exactly as prescribed and DO NOT stop taking Eliquis? without talking to the doctor who prescribed the medication.  Stopping may increase your risk of developing a new blood clot.  Refill your prescription before you run out. ? ?After discharge, you should have regular check-up appointments with your healthcare provider that is prescribing your Eliquis?. ?   ?What do you do if you miss a dose? ?If a dose of ELIQUIS? is not taken at the scheduled time, take it as soon as possible on the same day and twice-daily administration should be resumed. The dose should not be doubled to make up for a missed dose. ? ?Important Safety Information ?A possible side effect of Eliquis? is bleeding. You should call your healthcare provider right away if you experience any of the following: ?Bleeding from an injury or your nose that does not stop. ?Unusual colored urine (red or dark brown) or unusual colored stools (red or black). ?Unusual bruising for unknown reasons. ?A serious fall or if you hit your head (even if there is no bleeding). ? ?Some medicines may interact with Eliquis? and might increase your risk  of bleeding or clotting while on Eliquis?Marland Kitchen To help avoid this, consult your healthcare provider or pharmacist prior to using any new prescription or non-prescription medications, including herbals, vitamins, non-steroidal anti-inflammatory drugs (NSAIDs) and supplements. ? ?This website has more information on Eliquis? (apixaban): http://www.eliquis.com/eliquis/home ? ?

## 2021-05-21 NOTE — Progress Notes (Signed)
Inpatient Diabetes Program Recommendations ? ?AACE/ADA: New Consensus Statement on Inpatient Glycemic Control (2015) ? ?Target Ranges:  Prepandial:   less than 140 mg/dL ?     Peak postprandial:   less than 180 mg/dL (1-2 hours) ?     Critically ill patients:  140 - 180 mg/dL  ? ?Lab Results  ?Component Value Date  ? GLUCAP 261 (H) 05/21/2021  ? HGBA1C 8.3 (H) 05/17/2021  ? ? ?Review of Glycemic Control ? Latest Reference Range & Units 05/21/21 06:12 05/21/21 12:01 05/21/21 12:28  ?Glucose-Capillary 70 - 99 mg/dL 189 (H) 224 (H) 261 (H)  ?(H): Data is abnormally high ? ?Diabetes history: DM2 ?Outpatient Diabetes medications: Jardiance 10 mg QD, Humalog 50 units BF, Humalog 30 units lunch, Humalog 50 units dinner, Humalog 1-10 units with snacks, Xultophy 85 units QD ?Current orders for Inpatient glycemic control: Semglee 20 units QHS, Novolog 0-20 units TID ? ?Inpatient Diabetes Program Recommendations:   ? ?Novolog 3 units TID with meals if consumes at least 50%. ? ?Will continue to follow while inpatient. ? ?Thank you, ?Reche Dixon, MSN, RN ?Diabetes Coordinator ?Inpatient Diabetes Program ?[phone] (team pager from 8a-5p) ? ? ? ?

## 2021-05-21 NOTE — Progress Notes (Signed)
STROKE TEAM PROGRESS NOTE  ? ?INTERVAL HISTORY ?Patient is sitting up in bedside chair and states strength of her hand movements is improving.. She qualified for the Sleep Smart trial and did tolerate CPAP mask last night.  She was randomized to the medical treatment . She is updated on these results.    ? ? ?Vitals:  ? 05/20/21 2016 05/20/21 2040 05/21/21 0022 05/21/21 0500  ?BP: 136/71  106/64 139/79  ?Pulse: 91  83 79  ?Resp: 19  18 20   ?Temp: 98.7 ?F (37.1 ?C)  97.7 ?F (36.5 ?C) 98 ?F (36.7 ?C)  ?TempSrc: Oral  Oral Oral  ?SpO2: 99% 98% 97% 94%  ?Weight:      ?Height:      ? ?CBC:  ?Recent Labs  ?Lab 05/17/21 ?0023 05/17/21 ?3825 05/18/21 ?0539  ?WBC 10.4 11.4* 9.2  ?NEUTROABS 7.9*  --  6.2  ?HGB 12.4 12.6 13.0  ?HCT 42.1 40.2 40.9  ?MCV 88.1 88.0 84.7  ?PLT 237 234 244  ? ?Basic Metabolic Panel:  ?Recent Labs  ?Lab 05/19/21 ?7673 05/20/21 ?4193  ?NA 140 141  ?K 3.7 3.7  ?CL 105 103  ?CO2 28 31  ?GLUCOSE 83 100*  ?BUN 13 14  ?CREATININE 1.05* 1.07*  ?CALCIUM 9.1 8.7*  ? ?Lipid Panel:  ?Recent Labs  ?Lab 05/17/21 ?7902  ?CHOL 254*  ?TRIG 67  ?HDL 75  ?CHOLHDL 3.4  ?VLDL 13  ?[place] 166*  ? ?HgbA1c:  ?Recent Labs  ?Lab 05/17/21 ?4097  ?HGBA1C 8.3*  ? ?Urine Drug Screen:  ?Recent Labs  ?Lab 05/17/21 ?3532  ?LABOPIA NONE DETECTED  ?COCAINSCRNUR NONE DETECTED  ?LABBENZ NONE DETECTED  ?AMPHETMU NONE DETECTED  ?THCU NONE DETECTED  ?LABBARB NONE DETECTED  ?  ?Alcohol Level  ?Recent Labs  ?Lab 05/17/21 ?0023  ?ETH <10  ? ? ?IMAGING past 24 hours ?No results found. ? ?PHYSICAL EXAM ? ?Physical Exam  ?Constitutional: Pleasant elderly obese African-American lady not in distress.Marland Kitchen  ?Psych: Affect appropriate to situation ?Eyes: No scleral injection ?HENT: No OP obstrucion ?MSK: no joint deformities.  ?Cardiovascular: Normal rate and regular rhythm.  ?Respiratory: Effort normal, non-labored breathing ?GI: Soft.  No distension. ?Skin: WDI ? ?Neuro: ?Mental Status: ?Patient is awake, alert, oriented to person, place, month,  year, and situation. ?Patient is able to give a clear and coherent history. ?No signs of aphasia or neglect ?Cranial Nerves: ?II: Visual Fields are full. Pupils are equal, round, and reactive to light.   ?III,IV, VI: EOMI without ptosis or diploplia.  ?V: untested ?VII: Facial movement is symmetric resting and smiling ?VIII: Hearing is intact to voice ?X: untested ?XI: Shoulder shrug is symmetric. ?XII: Tongue protrudes midline without atrophy or fasciculations.  ?Motor: ?L hand with improved fine motor movements but there is still mild weakness ?Otherwise 5/5 strength in all extremities ?Sensory: ?Sensation is symmetric to light touch and temperature in the arms and legs.  ?Deep Tendon Reflexes: ?untested ? ? ? ?ASSESSMENT/PLAN ?Ms. Candelaria Pies Roper is a 71 y.o. female with history of  hypertension, diabetes, DVT on anticoagulation, COPD, presenting with L hand weakness.  ? ?Stroke: acute infarcts involving the right pre-central gyrus  and the right frontal white matter, most likely due to embolic source in a patient with no past Hx of afib or structural heart disease. Studies negative for large PFO.  ? ?Code stroke, CT head No acute abnormality. ASPECTS 10.    ?MRI: acute infarcts involving the right precentral gyrus,   and right frontal  lobe white matter. Additional infarct involving the right insula may be subacute. ?MRA Head: no intracranial large vessel occlusion or significant stenosis. ?Carotid Doppler: "normal study" ?2D Echo EF 45-50%, G2DD, no atrial shunt ?Trans-cranial doppler: negative for large PFO, sub-optimal study given lack of ability to localize MCA (ophthalmic artery was used instead).  ?LDL 166 ?HgbA1c 8.3 ?VTE prophylaxis - Lovenox ?   ?Diet  ? Diet Carb Modified Fluid consistency: Thin; Room service appropriate? Yes  ? ?On Eliquis 5 mg BID prior to admission for chronic DVT, restarted 4/3 ?Ziopatch to be ordered upon discharge (discussed with cardiology) ?Therapy recommendations:  outpatient  OT ?Disposition:  home ? ?Hypertension ?Home meds: bisoprolol  ?Unstable ?Permissive hypertension (OK if < 220/120) but gradually normalize in 5-7 days ?Long-term BP goal normotensive ? ?Hyperlipidemia ?Home meds: pravastatin 80 mg ?LDL 166, goal < 70 ?Changed to atorvastatin 80 mg  ?Continue statin at discharge ? ?Diabetes type II Uncontrolled ?Home meds:  Jardiance 10 mg daily, Humalog 50U with breakfast and dinner, 30 units with lunch  ?HgbA1c 8.3, goal < 7.0 ?CBGs ?Recent Labs  ?  05/20/21 ?1617 05/20/21 ?2134 05/21/21 ?0612  ?GLUCAP 196* 222* 189*  ?  ?SSI ? ?Other Stroke Risk Factors ?Advanced Age >/= 15  ?Cigarette smoker, advised to stop smoking ?ETOH use, alcohol level <10, advised to drink no more than 1-2 drink(s) a day ?Obesity, Body mass index is 36.75 kg/m?., BMI >/= 30 associated with increased stroke risk, recommend weight loss, diet and exercise as appropriate  ?OSA, enrolled in Sleep Smart trial ? ?Other Active Problems ?Diuresis as managed by primary ? 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  Continue aggressive risk factor modification.  Patient is participating in the sleep smart stroke prevention trial and was randomized to medical treatment arm.  Follow-up as an outpatient as per sleep smart study protocol.  Discussed with patient and Dr. Delphia Grates.  Greater than 50% time during this 35-minute visit was spent in counseling and coordination of care and discussion with patient and care team and answering questions. ?Antony Contras MD ? ?To contact Stroke Continuity provider, please refer to [email or web address] ?After hours, contact General Neurology ? ?

## 2021-05-26 ENCOUNTER — Other Ambulatory Visit: Payer: Self-pay | Admitting: *Deleted

## 2021-05-26 NOTE — Patient Outreach (Signed)
Received a red flag Emmi stroke notification for Stephanie [name] . ?I have assigned Valente David, RN to call for follow up and determine if there are any Case Management needs.  ?  ?Arville Care, CBCS, CMAA ?[place] Management Assistant ?[place] Management ?[phone]   ?

## 2021-05-26 NOTE — Patient Instructions (Signed)
Visit Information ? ?Thank you for taking time to visit with me today. Please don't hesitate to contact me if I can be of assistance to you before our next scheduled telephone appointment. ? ?Following are the goals we discussed today:  ?Discuss concerns with blood sugar readings with provider during next visit. ?Call Neurology office to schedule follow up at [phone]. ? ?Our next appointment is by telephone in 2 weeks ? ?The patient verbalized understanding of instructions, educational materials, and care plan provided today and agreed to receive a mailed copy of patient instructions, educational materials, and care plan.  ? ?The patient has been provided with contact information for the care management team and has been advised to call with any health related questions or concerns.  ? ?Valente David, RN, MSN, CCM ?[facility] Care Management  ?Community Care Manager ?[phone] ? ? ? ? ?

## 2021-05-26 NOTE — Patient Outreach (Signed)
[facility]) Care Management ? ?05/26/2021 ? ?Stephanie [name] ?Jul 27, 1950 ?[id number] ? ? ?RED ON EMMI ALERT - Stroke ?Day # 1 ?Date: 4/7 ?Red Alert Reason: Know how/when to take medications?  NO ? ? ?Outreach attempt #1, successful.  Identity verified.  This care manager introduced self and stated purpose of call.  [facility] care management services explained.   ? ?Member report daughter living with her, but state she is independent in all ADL's.  State she does not have any major deficits from stroke, uses cane for ambulation.  Report she does have rollator that she uses when needed.  She has follow up appointments with her PCP tomorrow, the cancer center on 4/24, and cardiology on 5/8.  State neurology office will call to schedule follow up.  Contact information provided just in case she will need to call office directly.  Outpatient rehab services will start on 5/3.  Daughter will provide transportation.  ? ?She manages her blood pressure well at home, today's reading was 120/80.  Report blood sugars are usually high in the mornings, but will drop throughout the day.  This morning it was 300 but has come down to 245.  State there are days that it will drop as low as 40-50 in the evenings.  She will discuss titration of insulin doses tomorrow during appointment.  She denies any questions about other medications. List reviewed with member, report adherence. ? ? ?Plan: ?RN CM will send education regarding stroke recovery and prevention.  Will follow up within the next 2 weeks. ? ?Valente David, RN, MSN, CCM ?[facility] Care Management  ?Community Care Manager ?[phone] ? ? ?

## 2021-05-29 ENCOUNTER — Telehealth: Payer: Self-pay | Admitting: Cardiology

## 2021-05-29 ENCOUNTER — Other Ambulatory Visit: Payer: Self-pay | Admitting: Gastroenterology

## 2021-05-29 DIAGNOSIS — K5900 Constipation, unspecified: Secondary | ICD-10-CM

## 2021-05-29 NOTE — Telephone Encounter (Signed)
Last ov 01/21/2021 ?

## 2021-05-29 NOTE — Telephone Encounter (Signed)
Patient states she received a heart monitor in the mail, she would like to come in so she can have someone place it on her.  ?

## 2021-05-30 DIAGNOSIS — I639 Cerebral infarction, unspecified: Secondary | ICD-10-CM | POA: Diagnosis not present

## 2021-05-30 NOTE — Telephone Encounter (Signed)
Monitor applied ?

## 2021-05-30 NOTE — Telephone Encounter (Signed)
Left message for patient to come at 4 pm today after our clinic and we will place monitor on her. ?

## 2021-06-06 NOTE — Progress Notes (Signed)
? ?[facility] ?[address] ?[place], [place] [address] ? ? ?CLINIC:  ?Medical Oncology/Hematology ? ?PCP:  ?[name], FNP ?[place] [address] ?[phone] ? ? ?REASON FOR VISIT:  ?Follow-up for MGUS, history of DVT, leukocytosis, microcytic anemia ? ?CURRENT THERAPY: Apixaban for DVT.  Surveillance. ? ?INTERVAL HISTORY:  ?Stephanie [name] 71 y.o. 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  She was last seen by Tarri Abernethy PA-C on 02/05/2021. ? ?At today's visit, she reports feeling fair.   ?Stephanie [name] was recently hospitalized from 05/16/2021 through 05/21/2021 for cryptogenic stroke, with brain MRI confirming acute right-sided multifocal CVA.  Neurology recommended continuation of apixaban for chronic DVT, and continued medical management to reduce risk of future CVA.  She4 is recovering well at home now. ? ?She denies any new bone pain. ?Apart from some residual left hand numbness from her stroke, she denies any new neuropathy. ?She has lost some weight related to diuresis and her recent hospital stay. ?She has not noticed any new lumps or bumps. ?She continues to take Eliquis for history of DVT. ?She denies any current signs or symptoms of DVT or PE. ?She denies any major bleeding events such as epistaxis, hematemesis, hematochezia, or melena. ? ?She has 70% energy and 100% appetite. She endorses that she is maintaining a stable weight. ? ? ? ?REVIEW OF SYSTEMS:  ?Review of Systems  ?Constitutional:  Positive for fatigue. Negative for appetite change, chills, diaphoresis, fever and unexpected weight change.  ?HENT:   Positive for trouble swallowing. Negative for lump/mass and nosebleeds.   ?Eyes:  Negative for eye problems.  ?Respiratory:  Negative for cough, hemoptysis and shortness of breath.   ?Cardiovascular:  Negative for chest pain, leg swelling and palpitations.  ?Gastrointestinal:  Positive for constipation and diarrhea.  Negative for abdominal pain, blood in stool, nausea and vomiting.  ?Genitourinary:  Negative for hematuria.   ?Skin: Negative.   ?Neurological:  Positive for numbness. Negative for dizziness, headaches and light-headedness.  ?Hematological:  Does not bruise/bleed easily.  ?Psychiatric/Behavioral:  Positive for sleep disturbance.    ? ? ?PAST MEDICAL/SURGICAL HISTORY:  ?Past Medical History:  ?Diagnosis Date  ? Anemia   ? Arthritis   ? Asthma   ? COPD (chronic obstructive pulmonary disease) ([place])   ? COVID-19   ? Deep vein thrombosis (DVT) of both lower extremities ([place]) 06/27/2015  ? Fibromyalgia   ? GERD (gastroesophageal reflux disease)   ? H/O hiatal hernia   ? Hypercholesteremia   ? Hypertension   ? Hyperthyroidism   ? IBS (irritable bowel syndrome)   ? Inappropriate sinus tachycardia   ? Inner ear disease   ? 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 Type 2 diabetes mellitus ([place])   ? ?Past Surgical History:  ?Procedure Laterality Date  ? ABDOMINAL HYSTERECTOMY  partial  ? CARPAL TUNNEL RELEASE Right 1991  ? CATARACT EXTRACTION W/PHACO Right 05/08/2013  ? Procedure: CATARACT EXTRACTION PHACO AND INTRAOCULAR LENS PLACEMENT (IOC);  Surgeon: Tonny Branch, MD;  Location: [facility] OR[facility];  Service: Ophthalmology;  Laterality: Right;  CDE 10.31  ? CATARACT EXTRACTION W/PHACO Left 08/17/2013  ? Procedure: CATARACT EXTRACTION PHACO AND INTRAOCULAR LENS PLACEMENT (IOC);  Surgeon: Tonny Branch, MD;  Location: [facility];  Service: Ophthalmology;  Laterality: Left;  CDE:9.03  ? CHOLECYSTECTOMY  1971  ? COLONOSCOPY WITH PROPOFOL N/A 01/06/2016  ? Dr. Gala Romney: diverticulosis   ? DENTAL SURGERY    ?  ESOPHAGEAL BRUSHING  08/29/2019  ? Procedure: ESOPHAGEAL BRUSHING;  Surgeon: Daneil Dolin, MD;  Location: [facility];  Service: Endoscopy;;  ? ESOPHAGOGASTRODUODENOSCOPY (EGD) WITH PROPOFOL N/A 01/06/2016  ? Dr. Gala Romney: normal s/p empiric dilation   ? ESOPHAGOGASTRODUODENOSCOPY (EGD) WITH PROPOFOL N/A 08/29/2019  ?  esophageal plaques vs medication residue adherent to tubular esophagus s/p KOH brushing and dilation. Medium-sized hiatal hernia. + for candida. Diflucan.   ? EYE SURGERY    ? MALONEY DILATION N/A 01/06/2016  ? Procedure: MALONEY DILATION;  Surgeon: Daneil Dolin, MD;  Location: [facility];  Service: Endoscopy;  Laterality: N/A;  ? MALONEY DILATION N/A 08/29/2019  ? Procedure: MALONEY DILATION;  Surgeon: Daneil Dolin, MD;  Location: [facility];  Service: Endoscopy;  Laterality: N/A;  ? REVERSE SHOULDER ARTHROPLASTY Right 08/06/2017  ? Procedure: RIGHT REVERSE SHOULDER ARTHROPLASTY;  Surgeon: Netta Cedars, MD;  Location: [facility];  Service: Orthopedics;  Laterality: Right;  ? WRIST GANGLION EXCISION Left   ? ? ? ?SOCIAL HISTORY:  ?Social History  ? ?Socioeconomic History  ? Marital status: Married  ?  Spouse name: Not on file  ? Number of children: Not on file  ? Years of education: Not on file  ? Highest education level: Not on file  ?Occupational History  ? Not on file  ?Tobacco Use  ? Smoking status: Former  ?  Packs/day: 0.25  ?  Years: 30.00  ?  Pack years: 7.50  ?  Types: Cigarettes  ?  Quit date: 02/17/2011  ?  Years since quitting: 10.3  ? Smokeless tobacco: Never  ?Vaping Use  ? Vaping Use: Never used  ?Substance and Sexual Activity  ? Alcohol use: Not Currently  ?  Comment: rare  ? Drug use: No  ? Sexual activity: Not Currently  ?  Birth control/protection: Surgical  ?Other Topics Concern  ? Not on file  ?Social History Narrative  ? Not on file  ? ?Social Determinants of Health  ? ?Financial Resource Strain: Not on file  ?Food Insecurity: Not on file  ?Transportation Needs: Not on file  ?Physical Activity: Not on file  ?Stress: Not on file  ?Social Connections: Not on file  ?Intimate Partner Violence: Not on file  ? ? ?FAMILY HISTORY:  ?Family History  ?Problem Relation Age of Onset  ? Hypertension Mother   ? Diabetes Mother   ? COPD Mother   ? Arthritis Mother   ? Diabetes Father   ? Arthritis Father    ? Dementia Father   ? CAD Father   ? Hypothyroidism Sister   ? Diabetes Brother   ? Colon cancer Niece   ? Colon polyps Neg Hx   ? ? ?CURRENT MEDICATIONS:  ?Outpatient Encounter Medications as of 06/09/2021  ?Medication Sig  ? Accu-Chek FastClix Lancets MISC Apply topically.  ? ACCU-CHEK GUIDE test strip 4 (four) times daily.  ? acetaminophen (TYLENOL) 500 MG tablet Take 1,000 mg by mouth every 6 (six) hours as needed for moderate pain or headache.  ? albuterol (PROVENTIL) (2.5 MG/3ML) 0.083% nebulizer solution Take 2.5 mg by nebulization every 6 (six) hours as needed for wheezing or shortness of breath.  ? albuterol (VENTOLIN HFA) 108 (90 Base) MCG/ACT inhaler Inhale 1-2 puffs into the lungs every 6 (six) hours as needed for wheezing or shortness of breath.  ? Alcohol Swabs (ALCOHOL PADS) 70 % PADS SMARTSIG:Pledget(s) Topical 4 Times Daily  ? apixaban (ELIQUIS) 5 MG TABS tablet Take 1 tablet (5  mg total) by mouth 2 (two) times daily.  ? ARNUITY ELLIPTA 100 MCG/ACT AEPB Inhale 1 puff into the lungs at bedtime.   ? Ascorbic Acid (VITAMIN C) 1000 MG tablet Take 1,000 mg by mouth daily.   ? atorvastatin (LIPITOR) 80 MG tablet Take 1 tablet (80 mg total) by mouth daily.  ? bisoprolol (ZEBETA) 10 MG tablet Take 1 tablet (10 mg total) by mouth in the morning and at bedtime.  ? Blood Glucose Calibration (ACCU-CHEK GUIDE CONTROL) LIQD See admin instructions.  ? Blood Glucose Monitoring Suppl (ACCU-CHEK GUIDE ME) w/Device KIT 4 (four) times daily.  ? Cholecalciferol (VITAMIN D) 2000 units tablet Take 4,000 Units by mouth daily.   ? cimetidine (TAGAMET) 200 MG tablet Take 0.5 tablets (100 mg total) by mouth daily as needed. (Patient taking differently: Take 100 mg by mouth daily as needed (indigestion).)  ? CINNAMON PO Take 2 capsules by mouth 3 (three) times daily.  ? clobetasol ointment (TEMOVATE) 2.08 % Apply 1 application topically 3 (three) times daily as needed (imflammation).  ? DULoxetine (CYMBALTA) 30 MG capsule Take  1 capsule (30 mg total) by mouth 2 (two) times daily.  ? fluticasone (VERAMYST) 27.5 MCG/SPRAY nasal spray Place 2 sprays into the nose daily.  ? guaiFENesin (ROBITUSSIN) 100 MG/5ML SOLN Take 5 mLs (100 mg to

## 2021-06-09 ENCOUNTER — Inpatient Hospital Stay (HOSPITAL_COMMUNITY): Payer: Medicare Other | Attending: Hematology | Admitting: Physician Assistant

## 2021-06-09 ENCOUNTER — Encounter (HOSPITAL_COMMUNITY): Payer: Self-pay | Admitting: Hematology

## 2021-06-09 VITALS — BP 128/73 | HR 76 | Temp 97.6°F | Resp 18 | Ht 68.0 in | Wt 219.8 lb

## 2021-06-09 DIAGNOSIS — D509 Iron deficiency anemia, unspecified: Secondary | ICD-10-CM | POA: Insufficient documentation

## 2021-06-09 DIAGNOSIS — Z7901 Long term (current) use of anticoagulants: Secondary | ICD-10-CM | POA: Insufficient documentation

## 2021-06-09 DIAGNOSIS — E119 Type 2 diabetes mellitus without complications: Secondary | ICD-10-CM | POA: Insufficient documentation

## 2021-06-09 DIAGNOSIS — D472 Monoclonal gammopathy: Secondary | ICD-10-CM | POA: Insufficient documentation

## 2021-06-09 DIAGNOSIS — Z79899 Other long term (current) drug therapy: Secondary | ICD-10-CM | POA: Diagnosis not present

## 2021-06-09 DIAGNOSIS — Z86718 Personal history of other venous thrombosis and embolism: Secondary | ICD-10-CM | POA: Insufficient documentation

## 2021-06-09 NOTE — Patient Instructions (Signed)
[place] at [facility] ?Discharge Instructions ? ?You were seen today by Tarri Abernethy PA-C for your MGUS (abnormal protein).  As we discussed, your protein levels are slightly higher than they were before, although you do not show any other signs of progression to cancer.  We will check your labs again in 4 months. ? ?Continue to take Eliquis for your history of blood clots.  Seek immediate medical attention if you have any major bleeding events while taking Eliquis. ? ?Your white blood cells have come back to normal.  Your elevated white blood cells were likely related to underlying infection or inflammation at that time. ? ?Your blood and iron levels look good at today's visit.  You do not need any IV iron at this time.  We may need to restart you on iron pills at your next visit, but we can wait and see how your labs look.  Continue to eat iron rich vegetables and other iron rich foods in your diet. ? ?LABS: Return in 4 months for repeat labs ? ?FOLLOW-UP APPOINTMENT: Office visit in 4 months, after labs ? ? ?Thank you for choosing [place] at [facility] to provide your oncology and hematology care.  To afford each patient quality time with our provider, please arrive at least 15 minutes before your scheduled appointment time.  ? ?If you have a lab appointment with the [facility] please come in thru the Main Entrance and check in at the main information desk. ? ?You need to re-schedule your appointment should you arrive 10 or more minutes late.  We strive to give you quality time with our providers, and arriving late affects you and other patients whose appointments are after yours.  Also, if you no show three or more times for appointments you may be dismissed from the clinic at the providers discretion.     ?Again, thank you for choosing [facility].  Our hope is that these requests will decrease the amount of time that you wait before  being seen by our physicians.       ?_____________________________________________________________ ? ?Should you have questions after your visit to [facility], please contact our office at [phone] and follow the prompts.  Our office hours are 8:00 a.m. and 4:30 p.m. Monday - Friday.  Please note that voicemails left after 4:00 p.m. may not be returned until the following business day.  We are closed weekends and major holidays.  You do have access to a nurse 24-7, just call the main number to the clinic [phone] and do not press any options, hold on the line and a nurse will answer the phone.   ? ?For prescription refill requests, have your pharmacy contact our office and allow 72 hours.   ? ?Due to Covid, you will need to wear a mask upon entering the hospital. If you do not have a mask, a mask will be given to you at the Main Entrance upon arrival. For doctor visits, patients may have 1 support person age 15 or older with them. For treatment visits, patients can not have anyone with them due to social distancing guidelines and our immunocompromised population.  ? ? ? ?

## 2021-06-10 ENCOUNTER — Other Ambulatory Visit: Payer: Self-pay | Admitting: *Deleted

## 2021-06-10 NOTE — Patient Outreach (Signed)
[facility]) Care Management ? ?06/10/2021 ? ?Stephanie [name] ?[birth year] ?[id number] ? ? ?Outgoing call placed to member to follow up on stroke recovery.  State she is doing well, will start working with outpatient therapy next week.  PCP visit complete, report insulin dose were changed and she has not had any further hypoglycemic events.  Denies any urgent concerns, encouraged to contact this care manager with questions.  Will close case at this time. ? ?Valente David, RN, MSN, CCM ?[facility] Care Management  ?Community Care Manager ?[phone] ? ?

## 2021-06-16 ENCOUNTER — Other Ambulatory Visit: Payer: Self-pay | Admitting: Nephrology

## 2021-06-16 ENCOUNTER — Other Ambulatory Visit (HOSPITAL_COMMUNITY): Payer: Self-pay | Admitting: Nephrology

## 2021-06-16 DIAGNOSIS — E1122 Type 2 diabetes mellitus with diabetic chronic kidney disease: Secondary | ICD-10-CM

## 2021-06-16 DIAGNOSIS — R809 Proteinuria, unspecified: Secondary | ICD-10-CM

## 2021-06-18 ENCOUNTER — Encounter (HOSPITAL_COMMUNITY): Payer: Self-pay | Admitting: Occupational Therapy

## 2021-06-18 ENCOUNTER — Ambulatory Visit (HOSPITAL_COMMUNITY): Payer: Medicare Other | Attending: Family Medicine | Admitting: Occupational Therapy

## 2021-06-18 DIAGNOSIS — R29898 Other symptoms and signs involving the musculoskeletal system: Secondary | ICD-10-CM | POA: Insufficient documentation

## 2021-06-18 DIAGNOSIS — R278 Other lack of coordination: Secondary | ICD-10-CM | POA: Diagnosis present

## 2021-06-18 NOTE — Therapy (Signed)
?OUTPATIENT OCCUPATIONAL THERAPY NEURO EVALUATION ? ?Patient Name: Stephanie [name] ?MRN: [id number] ?DOB:1951/02/08, 71 y.o., female ?Today's Date: 06/18/2021 ? ?PCP: [name] ?REFERRING PROVIDER: Dr. Darliss Cheney ? ? OT End of Session - 06/18/21 1336   ? ? Visit Number 1   ? Number of Visits 1   ? Date for OT Re-Evaluation 06/25/21   ? Authorization Type 1) [employer] Medicare 2) Medicaid   ? Progress Note Due on Visit 10   ? OT Start Time 1302   ? OT Stop Time 1332   ? OT Time Calculation (min) 30 min   ? Activity Tolerance Patient tolerated treatment well   ? Behavior During Therapy [facility] for tasks assessed/performed   ? ?  ?  ? ?  ? ? ?Past Medical History:  ?Diagnosis Date  ? Anemia   ? Arthritis   ? Asthma   ? COPD (chronic obstructive pulmonary disease) ([place])   ? COVID-19   ? Deep vein thrombosis (DVT) of both lower extremities ([place]) 06/27/2015  ? Fibromyalgia   ? GERD (gastroesophageal reflux disease)   ? H/O hiatal hernia   ? Hypercholesteremia   ? Hypertension   ? Hyperthyroidism   ? IBS (irritable bowel syndrome)   ? Inappropriate sinus tachycardia   ? Inner ear disease   ? 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 Type 2 diabetes mellitus ([place])   ? ?Past Surgical History:  ?Procedure Laterality Date  ? ABDOMINAL HYSTERECTOMY  partial  ? CARPAL TUNNEL RELEASE Right 1991  ? CATARACT EXTRACTION W/PHACO Right 05/08/2013  ? Procedure: CATARACT EXTRACTION PHACO AND INTRAOCULAR LENS PLACEMENT (IOC);  Surgeon: Tonny Branch, MD;  [facility]: [facility];  Service: Ophthalmology;  Laterality: Right;  CDE 10.31  ? CATARACT EXTRACTION W/PHACO Left 08/17/2013  ? Procedure: CATARACT EXTRACTION PHACO AND INTRAOCULAR LENS PLACEMENT (IOC);  Surgeon: Tonny Branch, MD;  Location: [facility];  Service: Ophthalmology;  Laterality: Left;  CDE:9.03  ? CHOLECYSTECTOMY  1971  ? COLONOSCOPY WITH PROPOFOL N/A 01/06/2016  ? Dr. Gala Romney: diverticulosis   ? DENTAL SURGERY    ? ESOPHAGEAL BRUSHING  08/29/2019  ? Procedure: ESOPHAGEAL  BRUSHING;  Surgeon: Daneil Dolin, MD;  [facility];  Service: Endoscopy;;  ? ESOPHAGOGASTRODUODENOSCOPY (EGD) WITH PROPOFOL N/A 01/06/2016  ? Dr. Gala Romney: normal s/p empiric dilation   ? ESOPHAGOGASTRODUODENOSCOPY (EGD) WITH PROPOFOL N/A 08/29/2019  ? esophageal plaques vs medication residue adherent to tubular esophagus s/p KOH brushing and dilation. Medium-sized hiatal hernia. + for candida. Diflucan.   ? EYE SURGERY    ? MALONEY DILATION N/A 01/06/2016  ? Procedure: MALONEY DILATION;  Surgeon: Daneil Dolin, MD;  [facility];  Service: Endoscopy;  Laterality: N/A;  ? MALONEY DILATION N/A 08/29/2019  ? Procedure: MALONEY DILATION;  Surgeon: Daneil Dolin, MD;  [facility];  Service: Endoscopy;  Laterality: N/A;  ? REVERSE SHOULDER ARTHROPLASTY Right 08/06/2017  ? Procedure: RIGHT REVERSE SHOULDER ARTHROPLASTY;  Surgeon: Netta Cedars, MD;  Location: [place];  Service: Orthopedics;  Laterality: Right;  ? WRIST GANGLION EXCISION Left   ? ?Patient Active Problem List  ? Diagnosis Date Noted  ? Acute on chronic combined systolic and diastolic CHF (congestive heart failure) ([place]) 05/20/2021  ? At risk for obstructive sleep apnea 05/20/2021  ? Acute CVA (cerebrovascular accident) ([place]) 05/17/2021  ? Protein-calorie malnutrition, moderate ([place]) 05/17/2021  ? NASH (nonalcoholic steatohepatitis) 01/21/2021  ? [place] (dyspnea on exertion) 06/18/2020  ? Chronic diastolic CHF (congestive  heart failure) ([place])   ? COVID-19 03/17/2020  ? Pneumonia due to COVID-19 virus 03/11/2020  ? Acute respiratory disease due to COVID-19 virus 03/11/2020  ? Abnormal LFTs 02/28/2018  ? TIA (transient ischemic attack) 09/15/2017  ? Localized osteoarthritis of right shoulder 08/08/2017  ? Dysphagia 12/16/2015  ? 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 Constipation 10/27/2015  ? Diarrhea 08/02/2015  ? Elevated alkaline phosphatase level 07/23/2015  ? Heme positive stool 07/23/2015  ?  Hypoglycemia 07/01/2015  ? Deep vein thrombosis (DVT) of both lower extremities ([place]) 06/27/2015  ? 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 Maculopapular rash, generalized 06/27/2015  ? Alopecia 06/27/2015  ? Hypothyroidism 06/27/2015  ? Diverticulitis 10/19/2013  ? Tobacco abuse 10/19/2013  ? Obesity 10/19/2013  ? Diabetes ([place]) 10/19/2013  ? Hypertension   ? Anemia   ? GERD (gastroesophageal reflux disease)   ? COPD (chronic obstructive pulmonary disease) ([place])   ? ? ?ONSET DATE: 05/16/21 ? ?REFERRING DIAG: s/p right CVA ? ?THERAPY DIAG:  ?Other lack of coordination ? ?Other symptoms and signs involving the musculoskeletal system ? ?SUBJECTIVE:  ? ?SUBJECTIVE STATEMENT: ?S: I still drop things sometimes. ?Pt accompanied by: self ? ?PERTINENT HISTORY: Pt is a 71 y/o female presenting for evaluation s/p CVA on 05/16/21. Pt reports she had left hand weakness and couldn't pick something up. Pt has not received any therapy since discharge from the hospital.  ? ?PRECAUTIONS: None ? ?WEIGHT BEARING RESTRICTIONS No ? ?PAIN:  ?Are you having pain? No ? ?FALLS: Has patient fallen in last 6 months? No ? ?LIVING ENVIRONMENT: ?Lives with: lives with their family and lives with their daughter ? ? ?PLOF: Independent ? ?PATIENT GOALS: To improve use of the left arm.  ? ?OBJECTIVE:  ? ?HAND DOMINANCE: Right ? ?ADLs: ?Overall ADLs: Pt reports she drops things occasionally, cannot tie things, reports generalized weakness of the left hand.  ? ? ?MOBILITY STATUS: Independent ? ? ?FUNCTIONAL OUTCOME MEASURES: ?Quick Dash: 29.55 ? ?UE ROM    ? ? ?UE MMT:    ? ?MMT Left ?06/18/2021  ?Shoulder flexion 4+/5  ?Shoulder abduction 5/5  ?Shoulder internal rotation 5/5  ?Shoulder external rotation 5/5  ?Elbow flexion 5/5  ?Elbow extension 5/5  ?Wrist flexion 5/5  ?Wrist extension 5/5  ?Wrist ulnar deviation 5/5  ?Wrist radial deviation 5/5  ?Wrist pronation 5/5  ?Wrist supination 5/5  ?(Blank rows = not tested) ? ?HAND FUNCTION: ?Grip strength:  Right: 40 lbs; Left: 25 lbs, Lateral pinch: Right: 10 lbs, Left: 11 lbs, and 3 point pinch: Right: 9 lbs, Left: 8 lbs ? ?COORDINATION: ?9 Hole Peg test: Right: 37.9" sec; Left: 45.97" sec ? ?SENSATION: ?WFL ? ?COGNITION: ?Overall cognitive status: Within functional limits for tasks assessed ? ?VISION: ?Subjective report: No change from baseline ?Baseline vision: Wears glasses for reading only ?Visual history: cataracts-removed ? ?VISION ASSESSMENT: ?WFL ? ? ? ?PATIENT EDUCATION: ?Education details: red theraputty for grip and pinch, fine motor coordination tasks.  ?Person educated: Patient ?Education method: Explanation, Demonstration, and Handouts ?Education comprehension: verbalized understanding and returned demonstration ? ? ?HOME EXERCISE PROGRAM: ?Eval: red theraputty, fine motor coordination handout ? ? ? ?GOALS: ?Goals reviewed with patient? Yes ? ?SHORT TERM GOALS: Target date:  one time visit ? ?Pt will be educated on HEP for improved grip and pinch, fine motor coordination tasks during ADLs. ? ?Goal status: MET ? ? ?ASSESSMENT: ? ?CLINICAL IMPRESSION: ?Patient is a 71 y.o. female who was seen today for occupational therapy evaluation  for left hand weakness s/p CVA. Pt reports she has been working on her hand strength on her own and has seen improvements. Pt with decreased strength in left hand compared to dominant right hand, mild coordination deficits. Pt reports no difficulty during ADLs as daughter does everything for her-which is baseline since before her CVA. Pt agreeable to HEP for continued grip strengthening and coordination work.   ? ?PERFORMANCE DEFICITS in functional skills including ADLs, IADLs, coordination, and strength ? ?IMPAIRMENTS are limiting patient from ADLs and IADLs.  ? ?COMORBIDITIES has no other co-morbidities that affects occupational performance. Patient will benefit from skilled OT to address above impairments and improve overall function. ? ?MODIFICATION OR ASSISTANCE TO  COMPLETE EVALUATION: No modification of tasks or assist necessary to complete an evaluation. ? ?OT OCCUPATIONAL PROFILE AND HISTORY: Problem focused assessment: Including review of records relating to presenting prob

## 2021-06-18 NOTE — Patient Instructions (Signed)
Home Exercises Program ?Theraputty Exercises ? ?Do the following exercises 2 times a day using your affected hand.  ?1. Roll putty into a ball. ? ?2. Make into a pancake. ? ?3. Roll putty into a roll. ? ?4. Pinch along log with first finger and thumb.  ? ?5. Make into a ball. ? ?6. Roll it back into a log.  ? ?7. Pinch using thumb and side of first finger. ? ?8. Roll into a ball, then flatten into a pancake. ? ?9. Using your fingers, make putty into a mountain. ? ?10. Roll putty back into a ball and squeeze gently for 2-3 minutes.  ? ?

## 2021-06-23 ENCOUNTER — Ambulatory Visit (INDEPENDENT_AMBULATORY_CARE_PROVIDER_SITE_OTHER): Payer: Medicare Other | Admitting: Cardiology

## 2021-06-23 ENCOUNTER — Encounter: Payer: Self-pay | Admitting: Cardiology

## 2021-06-23 VITALS — BP 130/84 | HR 86 | Ht 68.5 in | Wt 221.4 lb

## 2021-06-23 DIAGNOSIS — Z86718 Personal history of other venous thrombosis and embolism: Secondary | ICD-10-CM

## 2021-06-23 DIAGNOSIS — Z8673 Personal history of transient ischemic attack (TIA), and cerebral infarction without residual deficits: Secondary | ICD-10-CM

## 2021-06-23 DIAGNOSIS — I429 Cardiomyopathy, unspecified: Secondary | ICD-10-CM | POA: Diagnosis not present

## 2021-06-23 NOTE — Patient Instructions (Signed)
Medication Instructions:  ?Your physician recommends that you continue on your current medications as directed. Please refer to the Current Medication list given to you today. ? ? ?Labwork: ?None today ? ?Testing/Procedures: ?None today ? ?Follow-Up: ?3 months ? ?Any Other Special Instructions Will Be Listed Below (If Applicable). ? ?If you need a refill on your cardiac medications before your next appointment, please call your pharmacy. ? ?

## 2021-06-23 NOTE — Progress Notes (Signed)
? ? ?Cardiology Office Note ? ?Date: 06/23/2021  ? ?ID: ALIXANDRA ALFIERI, DOB April 08, 1950, MRN [id number] ? ?PCP:  [name], FNP  ?Cardiologist:  Rozann Lesches, MD ?Electrophysiologist:  None  ? ?Chief Complaint  ?Patient presents with  ? Cardiac follow-up  ? ? ?History of Present Illness: ?KAYTLYN DIN is a 71 y.o. female last seen in November 2022 by Ms. Strader PA-C.  I reviewed the chart, she was recently hospitalized with acute stroke, mainly experiencing left arm weakness, confirmed by brain MRI (acute infarct right precentral gyrus, right frontal lobe white matter, and subacute right insula infarct).  She was seen by neurology, carotid Doppler showed no substantial ICA stenosis, no evidence of interatrial communication, LVEF was 45 to 50% range however.  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  Zio patch placed at discharge and she is still wearing it now.  Has not had follow-up with neurology yet. ? ?Bisoprolol was increased to 10 mg twice daily at last visit.  She is tolerating this well.  Also on Lipitor, Jardiance, Demadex, and potassium supplement. ? ?She also follows in the oncology clinic for monitoring, also has IgG kappa MGUS. ? ?Past Medical History:  ?Diagnosis Date  ? Anemia   ? Arthritis   ? Asthma   ? COPD (chronic obstructive pulmonary disease) ([place])   ? COVID-19   ? Deep vein thrombosis (DVT) of both lower extremities ([place]) 06/27/2015  ? Fibromyalgia   ? GERD (gastroesophageal reflux disease)   ? H/O hiatal hernia   ? Hypercholesteremia   ? Hypertension   ? Hyperthyroidism   ? IBS (irritable bowel syndrome)   ? Inappropriate sinus tachycardia   ? Inner ear disease   ? 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 Type 2 diabetes mellitus ([place])   ? ? ?Past Surgical History:  ?Procedure Laterality Date  ? ABDOMINAL HYSTERECTOMY  partial  ? CARPAL TUNNEL RELEASE Right 1991  ? CATARACT EXTRACTION W/PHACO Right 05/08/2013  ?  Procedure: CATARACT EXTRACTION PHACO AND INTRAOCULAR LENS PLACEMENT (IOC);  Surgeon: Tonny Branch, MD;  Location: [facility];  Service: Ophthalmology;  Laterality: Right;  CDE 10.31  ? CATARACT EXTRACTION W/PHACO Left 08/17/2013  ? Procedure: CATARACT EXTRACTION PHACO AND INTRAOCULAR LENS PLACEMENT (IOC);  Surgeon: Tonny Branch, MD;  [facility]: [facility];  Service: Ophthalmology;  Laterality: Left;  CDE:9.03  ? CHOLECYSTECTOMY  1971  ? COLONOSCOPY WITH PROPOFOL N/A 01/06/2016  ? Dr. Gala Romney: diverticulosis   ? DENTAL SURGERY    ? ESOPHAGEAL BRUSHING  08/29/2019  ? Procedure: ESOPHAGEAL BRUSHING;  Surgeon: Daneil Dolin, MD;  [facility];  Service: Endoscopy;;  ? ESOPHAGOGASTRODUODENOSCOPY (EGD) WITH PROPOFOL N/A 01/06/2016  ? Dr. Gala Romney: normal s/p empiric dilation   ? ESOPHAGOGASTRODUODENOSCOPY (EGD) WITH PROPOFOL N/A 08/29/2019  ? esophageal plaques vs medication residue adherent to tubular esophagus s/p KOH brushing and dilation. Medium-sized hiatal hernia. + for candida. Diflucan.   ? EYE SURGERY    ? MALONEY DILATION N/A 01/06/2016  ? Procedure: MALONEY DILATION;  Surgeon: Daneil Dolin, MD;  [facility];  Service: Endoscopy;  Laterality: N/A;  ? MALONEY DILATION N/A 08/29/2019  ? Procedure: MALONEY DILATION;  Surgeon: Daneil Dolin, MD;  [facility];  Service: Endoscopy;  Laterality: N/A;  ? REVERSE SHOULDER ARTHROPLASTY Right 08/06/2017  ? Procedure: RIGHT REVERSE SHOULDER ARTHROPLASTY;  Surgeon: Netta Cedars, MD;  Location: [place];  Service: Orthopedics;  Laterality: Right;  ? WRIST GANGLION EXCISION Left   ? ? ?Current Outpatient Medications  ?Medication Sig Dispense Refill  ? Accu-Chek FastClix Lancets MISC Apply topically.    ? ACCU-CHEK GUIDE test strip 4 (four) times daily.    ? acetaminophen (TYLENOL) 500 MG tablet Take 1,000 mg by mouth every 6 (six) hours as needed for moderate pain or headache.    ? albuterol (PROVENTIL) (2.5 MG/3ML) 0.083% nebulizer solution Take 2.5 mg by  nebulization every 6 (six) hours as needed for wheezing or shortness of breath.    ? albuterol (VENTOLIN HFA) 108 (90 Base) MCG/ACT inhaler Inhale 1-2 puffs into the lungs every 6 (six) hours as needed for wheezing or shortness of breath. 18 g 0  ? Alcohol Swabs (ALCOHOL PADS) 70 % PADS SMARTSIG:Pledget(s) Topical 4 Times Daily    ? apixaban (ELIQUIS) 5 MG TABS tablet Take 1 tablet (5 mg total) by mouth 2 (two) times daily.    ? ARNUITY ELLIPTA 100 MCG/ACT AEPB Inhale 1 puff into the lungs at bedtime.   11  ? Ascorbic Acid (VITAMIN C) 1000 MG tablet Take 500 mg by mouth daily.    ? atorvastatin (LIPITOR) 80 MG tablet Take 1 tablet (80 mg total) by mouth daily. 30 tablet 0  ? bisoprolol (ZEBETA) 10 MG tablet Take 1 tablet (10 mg total) by mouth in the morning and at bedtime. 180 tablet 3  ? Blood Glucose Calibration (ACCU-CHEK GUIDE CONTROL) LIQD See admin instructions.    ? Blood Glucose Monitoring Suppl (ACCU-CHEK GUIDE ME) w/Device KIT 4 (four) times daily.    ? Cetirizine HCl 10 MG CAPS cetirizine 10 mg capsule ? Take by oral route.    ? Cholecalciferol (VITAMIN D) 2000 units tablet Take 4,000 Units by mouth daily.     ? cimetidine (TAGAMET) 200 MG tablet Take 0.5 tablets (100 mg total) by mouth daily as needed. (Patient taking differently: Take 100 mg by mouth daily as needed (indigestion).)    ? CINNAMON PO Take 2 capsules by mouth 3 (three) times daily.    ? clobetasol ointment (TEMOVATE) 9.47 % Apply 1 application topically 3 (three) times daily as needed (imflammation).    ? diphenhydrAMINE (BENADRYL) 25 mg capsule 2 capsules every 4 (four) hours    ? Dulaglutide (TRULICITY) 1.5 SJ/6.2EZ SOPN Trulicity 1.5 MO/2.9 mL subcutaneous pen injector ? INJECT 1.5 MG SUBCUTANEOUSLY ONCE A WEEK FOR DIABETES REPLACES TRADJENTA    ? DULoxetine (CYMBALTA) 30 MG capsule Take 1 capsule (30 mg total) by mouth 2 (two) times daily. 30 capsule -0  ? EASY COMFORT PEN NEEDLES 31G X 5 MM MISC INJECT IN SULIN EVERY DAY AS DIRECTED     ? fluticasone (VERAMYST) 27.5 MCG/SPRAY nasal spray Place 2 sprays into the nose daily.    ? guaiFENesin (ROBITUSSIN) 100 MG/5ML SOLN Take 5 mLs (100 mg total) by mouth every 4 (four) hours as needed for cough or to loosen phlegm. 236 mL 0  ? HUMALOG KWIKPEN 100 UNIT/ML KiwkPen Inject 1-50 Units into the skin See admin instructions. Inject 50 units with breakfast, 30 units with lunch, and 50 units with dinner. Inject 1-10 units with snacks  5  ? hydrOXYzine (ATARAX/VISTARIL) 25 MG tablet Take 25 mg by mouth every 8 (eight) hours as needed for itching.     ? insulin glargine (LANTUS SOLOSTAR) 100 UNIT/ML Solostar Pen Lantus Solostar U-100 Insulin 100 unit/mL (3 mL) subcutaneous pen    ? ipratropium-albuterol (DUONEB) 0.5-2.5 (3) MG/3ML SOLN Take  3 mLs by nebulization every 6 (six) hours as needed (shortness of breath).    ? JARDIANCE 10 MG TABS tablet Take 20 mg by mouth every morning.    ? Lancet Devices (EASY MINI EJECT LANCING DEVICE) MISC 4 (four) times daily.    ? levothyroxine (SYNTHROID) 88 MCG tablet Take 88 mcg by mouth daily before breakfast.    ? lubiprostone (AMITIZA) 8 MCG capsule TAKE 1 CAPSULE(8 MCG) BY MOUTH TWICE DAILY WITH A MEAL 60 capsule 3  ? LYRICA 75 MG capsule Take 75 mg by mouth 2 (two) times daily.     ? methocarbamol (ROBAXIN) 500 MG tablet Take 1 tablet (500 mg total) by mouth 3 (three) times daily as needed. (Patient taking differently: Take 500 mg by mouth 3 (three) times daily as needed for muscle spasms.) 60 tablet 1  ? montelukast (SINGULAIR) 10 MG tablet Take 1 tablet by mouth every evening.    ? omega-3 acid ethyl esters (LOVAZA) 1 g capsule Take 2 capsules (2 g total) by mouth 2 (two) times daily. 120 capsule 0  ? pantoprazole (PROTONIX) 40 MG tablet Take 1 tablet (40 mg total) by mouth daily. 90 tablet 3  ? potassium chloride (KLOR-CON) 10 MEQ tablet Take 10 mEq by mouth daily.    ? RESTASIS 0.05 % ophthalmic emulsion Place 1 drop into both eyes 2 (two) times daily.    ?  torsemide (DEMADEX) 20 MG tablet Take 2 tablets (40 mg total) by mouth daily. 60 tablet 0  ? traMADol (ULTRAM) 50 MG tablet Take 50 mg by mouth 3 (three) times daily as needed for moderate pain.  1  ? traZODone (DESYR

## 2021-06-24 ENCOUNTER — Encounter (HOSPITAL_COMMUNITY): Payer: Medicare Other

## 2021-06-26 ENCOUNTER — Encounter (HOSPITAL_COMMUNITY): Payer: Medicare Other | Admitting: Occupational Therapy

## 2021-06-30 ENCOUNTER — Ambulatory Visit (HOSPITAL_COMMUNITY)
Admission: RE | Admit: 2021-06-30 | Discharge: 2021-06-30 | Disposition: A | Discharge status: Home / Self Care | Payer: Medicare Other | Source: Ambulatory Visit | Attending: Nephrology | Admitting: Nephrology

## 2021-06-30 DIAGNOSIS — E1129 Type 2 diabetes mellitus with other diabetic kidney complication: Secondary | ICD-10-CM | POA: Insufficient documentation

## 2021-06-30 DIAGNOSIS — R809 Proteinuria, unspecified: Secondary | ICD-10-CM | POA: Diagnosis present

## 2021-06-30 DIAGNOSIS — E1122 Type 2 diabetes mellitus with diabetic chronic kidney disease: Secondary | ICD-10-CM | POA: Insufficient documentation

## 2021-07-01 ENCOUNTER — Encounter (HOSPITAL_COMMUNITY): Payer: Medicare Other | Admitting: Occupational Therapy

## 2021-07-02 ENCOUNTER — Ambulatory Visit (INDEPENDENT_AMBULATORY_CARE_PROVIDER_SITE_OTHER): Payer: Medicare Other

## 2021-07-02 DIAGNOSIS — I639 Cerebral infarction, unspecified: Secondary | ICD-10-CM

## 2021-07-03 ENCOUNTER — Encounter (HOSPITAL_COMMUNITY): Payer: Self-pay | Admitting: Hematology

## 2021-07-08 ENCOUNTER — Encounter (HOSPITAL_COMMUNITY): Payer: Medicare Other | Admitting: Occupational Therapy

## 2021-07-10 ENCOUNTER — Encounter (HOSPITAL_COMMUNITY): Payer: Medicare Other

## 2021-07-24 ENCOUNTER — Other Ambulatory Visit (HOSPITAL_COMMUNITY)
Admission: RE | Admit: 2021-07-24 | Discharge: 2021-07-24 | Disposition: A | Discharge status: Home / Self Care | Payer: Medicare Other | Source: Ambulatory Visit | Attending: Family Medicine | Admitting: Family Medicine

## 2021-07-24 DIAGNOSIS — E1121 Type 2 diabetes mellitus with diabetic nephropathy: Secondary | ICD-10-CM | POA: Insufficient documentation

## 2021-07-24 DIAGNOSIS — E785 Hyperlipidemia, unspecified: Secondary | ICD-10-CM | POA: Insufficient documentation

## 2021-07-24 DIAGNOSIS — E039 Hypothyroidism, unspecified: Secondary | ICD-10-CM | POA: Diagnosis present

## 2021-07-24 DIAGNOSIS — E1165 Type 2 diabetes mellitus with hyperglycemia: Secondary | ICD-10-CM | POA: Insufficient documentation

## 2021-07-24 LAB — LIPID PANEL
Cholesterol: 203 mg/dL — ABNORMAL HIGH (ref 0–200)
HDL: 63 mg/dL (ref >40)
LDL Cholesterol: 119 mg/dL — ABNORMAL HIGH (ref 0–99)
Total CHOL/HDL Ratio: 3.2 RATIO
Triglycerides: 104 mg/dL (ref <150)
VLDL: 21 mg/dL (ref 0–40)

## 2021-07-24 LAB — TSH: TSH: 2.213 u[IU]/mL (ref 0.350–4.500)

## 2021-07-24 LAB — COMPREHENSIVE METABOLIC PANEL
ALT: 57 U/L — ABNORMAL HIGH (ref 0–44)
AST: 42 U/L — ABNORMAL HIGH (ref 15–41)
Albumin: 2.9 g/dL — ABNORMAL LOW (ref 3.5–5.0)
Alkaline Phosphatase: 384 U/L — ABNORMAL HIGH (ref 38–126)
Anion gap: 8 (ref 5–15)
BUN: 22 mg/dL (ref 8–23)
CO2: 23 mmol/L (ref 22–32)
Calcium: 9.1 mg/dL (ref 8.9–10.3)
Chloride: 106 mmol/L (ref 98–111)
Creatinine, Ser: 1.18 mg/dL — ABNORMAL HIGH (ref 0.44–1.00)
GFR, Estimated: 50 mL/min — ABNORMAL LOW (ref >60)
Glucose, Bld: 225 mg/dL — ABNORMAL HIGH (ref 70–99)
Potassium: 5 mmol/L (ref 3.5–5.1)
Sodium: 137 mmol/L (ref 135–145)
Total Bilirubin: 0.7 mg/dL (ref 0.3–1.2)
Total Protein: 8.3 g/dL — ABNORMAL HIGH (ref 6.5–8.1)

## 2021-07-24 LAB — HEMOGLOBIN A1C
Hgb A1c MFr Bld: 9.2 % — ABNORMAL HIGH (ref 4.8–5.6)
Mean Plasma Glucose: 217.34 mg/dL

## 2021-07-28 ENCOUNTER — Encounter: Payer: Self-pay | Admitting: Nurse Practitioner

## 2021-07-28 ENCOUNTER — Ambulatory Visit (INDEPENDENT_AMBULATORY_CARE_PROVIDER_SITE_OTHER): Payer: Medicare Other | Admitting: Nurse Practitioner

## 2021-07-28 VITALS — BP 140/80 | HR 89 | Ht 68.0 in | Wt 225.0 lb

## 2021-07-28 DIAGNOSIS — E782 Mixed hyperlipidemia: Secondary | ICD-10-CM | POA: Diagnosis not present

## 2021-07-28 DIAGNOSIS — E1122 Type 2 diabetes mellitus with diabetic chronic kidney disease: Secondary | ICD-10-CM

## 2021-07-28 DIAGNOSIS — I10 Essential (primary) hypertension: Secondary | ICD-10-CM

## 2021-07-28 DIAGNOSIS — N1831 Chronic kidney disease, stage 3a: Secondary | ICD-10-CM

## 2021-07-28 DIAGNOSIS — Z794 Long term (current) use of insulin: Secondary | ICD-10-CM

## 2021-07-28 NOTE — Progress Notes (Signed)
Endocrinology Consult Note       07/28/2021, 4:39 PM   Subjective:    [name], female    DOB: 06-Sep-1950.  Stephanie [name] is being seen in consultation for management of currently uncontrolled symptomatic diabetes requested by  [name], [place].   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   Type 2 diabetes mellitus ([place])     Past Surgical History:  Procedure Laterality Date   ABDOMINAL HYSTERECTOMY  partial   CARPAL TUNNEL RELEASE Right 1991   CATARACT EXTRACTION W/PHACO Right 05/08/2013   Procedure: CATARACT EXTRACTION PHACO AND INTRAOCULAR LENS PLACEMENT ([place]);  Surgeon: Tonny Branch, MD;  Location: [facility];  Service: Ophthalmology;  Laterality: Right;  CDE 10.31   CATARACT EXTRACTION W/PHACO Left 08/17/2013   Procedure: CATARACT EXTRACTION PHACO AND INTRAOCULAR LENS PLACEMENT ([facility]);  Surgeon: Tonny Branch, MD;  Location: [facility];  Service: Ophthalmology;  Laterality: Left;  CDE:9.03   CHOLECYSTECTOMY  1971   COLONOSCOPY WITH PROPOFOL N/A 01/06/2016   Dr. Gala Romney: diverticulosis    DENTAL SURGERY     ESOPHAGEAL BRUSHING  08/29/2019   Procedure: ESOPHAGEAL BRUSHING;  Surgeon: Daneil Dolin, MD;  Location: [facility];  Service: Endoscopy;;   ESOPHAGOGASTRODUODENOSCOPY (EGD) WITH PROPOFOL N/A 01/06/2016   Dr. Gala Romney: normal s/p empiric dilation    ESOPHAGOGASTRODUODENOSCOPY (EGD) WITH PROPOFOL N/A 08/29/2019   esophageal plaques vs  medication residue adherent to tubular esophagus s/p KOH brushing and dilation. Medium-sized hiatal hernia. + for candida. Diflucan.    EYE SURGERY     MALONEY DILATION N/A 01/06/2016   Procedure: Venia Minks DILATION;  Surgeon: Daneil Dolin, MD;  Location: [facility];  Service: Endoscopy;  Laterality: N/A;   MALONEY DILATION N/A 08/29/2019   Procedure: Venia Minks DILATION;  Surgeon: Daneil Dolin, MD;  Location: [facility];  Service: Endoscopy;  Laterality: N/A;   REVERSE SHOULDER ARTHROPLASTY Right 08/06/2017   Procedure: RIGHT REVERSE SHOULDER ARTHROPLASTY;  Surgeon: Netta Cedars, MD;  Location: [facility];  Service: Orthopedics;  Laterality: Right;   WRIST GANGLION EXCISION Left     Social History   Socioeconomic History   Marital status: Married    Spouse name: Not on file   Number of children: Not on file   Years of education: Not on file   Highest education level: Not on file  Occupational History   Not on file  Tobacco Use   Smoking status: Former    Packs/day: 0.25    Years: 30.00    Total pack years: 7.50    Types: Cigarettes    Quit date: 02/17/2011    Years since  quitting: 10.4   Smokeless tobacco: Never  Vaping Use   Vaping Use: Never used  Substance and Sexual Activity   Alcohol use: Not Currently    Comment: rare   Drug use: No   Sexual activity: Not Currently    Birth control/protection: Surgical  Other Topics Concern   Not on file  Social History Narrative   Not on file   Social Determinants of Health   Financial Resource Strain: Not on file  Food Insecurity: Not on file  Transportation Needs: Not on file  Physical Activity: Not on file  Stress: Not on file  Social Connections: Not on file    Family History  Problem Relation Age of Onset   Hypertension Mother    Diabetes Mother    COPD Mother    Arthritis Mother    Diabetes Father    Arthritis Father    Dementia Father    CAD Father    Hypothyroidism Sister    Diabetes Brother    Colon  cancer Niece    Colon polyps Neg Hx     Outpatient Encounter Medications as of 07/28/2021  Medication Sig   Accu-Chek FastClix Lancets MISC Apply topically.   ACCU-CHEK GUIDE test strip 4 (four) times daily.   acetaminophen (TYLENOL) 500 MG tablet Take 1,000 mg by mouth every 6 (six) hours as needed for moderate pain or headache.   albuterol (PROVENTIL) (2.5 MG/3ML) 0.083% nebulizer solution Take 2.5 mg by nebulization every 6 (six) hours as needed for wheezing or shortness of breath.   albuterol (VENTOLIN HFA) 108 (90 Base) MCG/ACT inhaler Inhale 1-2 puffs into the lungs every 6 (six) hours as needed for wheezing or shortness of breath.   Alcohol Swabs (ALCOHOL PADS) 70 % PADS SMARTSIG:Pledget(s) Topical 4 Times Daily   apixaban (ELIQUIS) 5 MG TABS tablet Take 1 tablet (5 mg total) by mouth 2 (two) times daily.   ARNUITY ELLIPTA 100 MCG/ACT AEPB Inhale 1 puff into the lungs at bedtime.    Ascorbic Acid (VITAMIN C) 1000 MG tablet Take 500 mg by mouth daily.   atorvastatin (LIPITOR) 80 MG tablet Take 1 tablet (80 mg total) by mouth daily.   bisoprolol (ZEBETA) 10 MG tablet Take 1 tablet (10 mg total) by mouth in the morning and at bedtime.   Blood Glucose Calibration (ACCU-CHEK GUIDE CONTROL) LIQD See admin instructions.   Blood Glucose Monitoring Suppl (ACCU-CHEK GUIDE ME) w/Device KIT 4 (four) times daily.   Cetirizine HCl 10 MG CAPS cetirizine 10 mg capsule  Take by oral route.   Cholecalciferol (VITAMIN D) 2000 units tablet Take 4,000 Units by mouth daily.    cimetidine (TAGAMET) 200 MG tablet Take 0.5 tablets (100 mg total) by mouth daily as needed. (Patient taking differently: Take 100 mg by mouth daily as needed (indigestion).)   CINNAMON PO Take 2 capsules by mouth 3 (three) times daily.   clobetasol ointment (TEMOVATE) 9.32 % Apply 1 application topically 3 (three) times daily as needed (imflammation).   diphenhydrAMINE (BENADRYL) 25 mg capsule 2 capsules every 4 (four) hours    DULoxetine (CYMBALTA) 30 MG capsule Take 1 capsule (30 mg total) by mouth 2 (two) times daily.   EASY COMFORT PEN NEEDLES 31G X 5 MM MISC INJECT IN SULIN EVERY DAY AS DIRECTED   fluticasone (VERAMYST) 27.5 MCG/SPRAY nasal spray Place 2 sprays into the nose daily.   guaiFENesin (ROBITUSSIN) 100 MG/5ML SOLN Take 5 mLs (100 mg total) by mouth every 4 (four) hours as needed  for cough or to loosen phlegm.   HUMALOG KWIKPEN 100 UNIT/ML KiwkPen Inject 8-14 Units into the skin See admin instructions.   hydrOXYzine (ATARAX/VISTARIL) 25 MG tablet Take 25 mg by mouth every 8 (eight) hours as needed for itching.    ipratropium-albuterol (DUONEB) 0.5-2.5 (3) MG/3ML SOLN Take 3 mLs by nebulization every 6 (six) hours as needed (shortness of breath).   JARDIANCE 10 MG TABS tablet Take 20 mg by mouth every morning.   Lancet Devices (EASY MINI EJECT LANCING DEVICE) MISC 4 (four) times daily.   levothyroxine (SYNTHROID) 88 MCG tablet Take 88 mcg by mouth daily before breakfast.   lubiprostone (AMITIZA) 8 MCG capsule TAKE 1 CAPSULE(8 MCG) BY MOUTH TWICE DAILY WITH A MEAL   LYRICA 75 MG capsule Take 75 mg by mouth 2 (two) times daily.    methocarbamol (ROBAXIN) 500 MG tablet Take 1 tablet (500 mg total) by mouth 3 (three) times daily as needed. (Patient taking differently: Take 500 mg by mouth 3 (three) times daily as needed for muscle spasms.)   montelukast (SINGULAIR) 10 MG tablet Take 1 tablet by mouth every evening.   omega-3 acid ethyl esters (LOVAZA) 1 g capsule Take 2 capsules (2 g total) by mouth 2 (two) times daily.   pantoprazole (PROTONIX) 40 MG tablet Take 1 tablet (40 mg total) by mouth daily.   potassium chloride (KLOR-CON) 10 MEQ tablet Take 10 mEq by mouth daily.   RESTASIS 0.05 % ophthalmic emulsion Place 1 drop into both eyes 2 (two) times daily.   torsemide (DEMADEX) 20 MG tablet Take 2 tablets (40 mg total) by mouth daily.   traMADol (ULTRAM) 50 MG tablet Take 50 mg by mouth 3 (three) times daily  as needed for moderate pain.   traZODone (DESYREL) 50 MG tablet Take 50-100 mg by mouth at bedtime as needed for sleep.   vitamin B-12 (CYANOCOBALAMIN) 1000 MCG tablet Take 1,000 mcg by mouth daily.   XULTOPHY 100-3.6 UNIT-MG/ML SOPN Inject 85 Units as directed at bedtime. (Patient taking differently: Inject 40 Units as directed at bedtime.)   [DISCONTINUED] Dulaglutide (TRULICITY) 1.5 ZO/1.0RU SOPN Trulicity 1.5 EA/5.4 mL subcutaneous pen injector  INJECT 1.5 MG SUBCUTANEOUSLY ONCE A WEEK FOR DIABETES REPLACES TRADJENTA   [DISCONTINUED] insulin glargine (LANTUS SOLOSTAR) 100 UNIT/ML Solostar Pen Lantus Solostar U-100 Insulin 100 unit/mL (3 mL) subcutaneous pen   No facility-administered encounter medications on file as of 07/28/2021.    ALLERGIES: Allergies  Allergen Reactions   Tetracyclines & Related Anaphylaxis and Rash   Banana Hives and Nausea And Vomiting   Penicillins Rash and Other (See Comments)    Has patient had a PCN reaction causing immediate rash, facial/tongue/throat swelling, SOB or lightheadedness with hypotension: No Has patient had a PCN reaction causing severe rash involving mucus membranes or skin necrosis: No Has patient had a PCN reaction that required hospitalization No Has patient had a PCN reaction occurring within the last 10 years: No If all of the above answers are "NO", then may proceed with Cephalosporin use.     VACCINATION STATUS: Immunization History  Administered Date(s) Administered   Fluad Quad(high Dose 65+) 11/08/2018, 12/12/2020   Influenza,inj,Quad PF,6+ Mos 12/24/2015   Influenza-Unspecified 12/02/2016   Pneumococcal Polysaccharide-23 10/22/2013    Diabetes She presents for her initial diabetic visit. She has type 2 diabetes mellitus. Onset time: Diagnosed at approx age of 35. Her disease course has been fluctuating. Hypoglycemia symptoms include nervousness/anxiousness, sweats and tremors. Associated symptoms include fatigue, polydipsia  and  polyuria. Hypoglycemia complications include nocturnal hypoglycemia. Symptoms are stable. Diabetic complications include a CVA, [name] disease and nephropathy. Risk factors for coronary artery disease include diabetes mellitus, dyslipidemia, family history, hypertension, post-menopausal and sedentary lifestyle. Current diabetic treatment includes intensive insulin program and oral agent (monotherapy) (Xultophy 40 units nightly, Humalog TID and Jardiance 20 mg po daily). She is compliant with treatment most of the time. Her weight is fluctuating dramatically. She is following a generally unhealthy diet. When asked about meal planning, she reported none. She has not had a previous visit with a dietitian. She rarely participates in exercise. Her home blood glucose trend is fluctuating dramatically. Her overall blood glucose range is >200 mg/dl. (She presents today for her consultation, with her CGM showing drastically fluctuating glycemic profile.  Her most recent A1c on 07/24/21 was 9.2%.  She drinks flavored water, diet soda, and lemonade daily.  She does not have any eating pattern, eats mostly 2 meals per day with snacks between.  She does not routinely exercise due to mobility limitations other than chair/bed exercises.  She is UTD on eye exam and has seen podiatry in the past.  Analysis of her CGM shows TIR 29%, TAR 71%, TBR 0% with a GMI of 8.7%.) An ACE inhibitor/angiotensin II receptor blocker is not being taken. She sees a podiatrist.Eye exam is current.     Review of systems  Constitutional: + Minimally fluctuating body weight, current Body mass index is 34.21 kg/m., no fatigue, no subjective hyperthermia, no subjective hypothermia Eyes: no blurry vision, no xerophthalmia ENT: no sore throat, no nodules palpated in throat, no dysphagia/odynophagia, no hoarseness Cardiovascular: no chest pain, no shortness of breath, no palpitations, no leg swelling Respiratory: no cough, no shortness of  breath Gastrointestinal: no nausea/vomiting/diarrhea Genitourinary: + polyuria, vaginal yeast infection Musculoskeletal: no muscle/joint aches, walks with cane Skin: no rashes, no hyperemia Neurological: no tremors, no numbness, no tingling, no dizziness Psychiatric: no depression, no anxiety  Objective:     BP 140/80   Pulse 89   Ht _0  (1.727 m)   Wt 225 lb (102.1 kg)   BMI 34.21 kg/m   Wt Readings from Last 3 Encounters:  07/28/21 225 lb (102.1 kg)  06/23/21 221 lb 6.4 oz (100.4 kg)  06/09/21 219 lb 12.8 oz (99.7 kg)     BP Readings from Last 3 Encounters:  07/28/21 140/80  06/23/21 130/84  06/09/21 128/73     Physical Exam- Limited  Constitutional:  Body mass index is 34.21 kg/m. , not in acute distress, normal state of mind Eyes:  EOMI, no exophthalmos Neck: Supple Cardiovascular: RRR, no murmurs, rubs, or gallops, no edema Respiratory: Adequate breathing efforts, no crackles, rales, rhonchi, or wheezing Musculoskeletal: no gross deformities, strength intact in all four extremities, no gross restriction of joint movements, walks with cane Skin:  no rashes, no hyperemia Neurological: no tremor with outstretched hands    CMP ( most recent) CMP     Component Value Date/Time   NA 137 07/24/2021 1341   K 5.0 07/24/2021 1341   CL 106 07/24/2021 1341   CO2 23 07/24/2021 1341   GLUCOSE 225 (H) 07/24/2021 1341   BUN 22 07/24/2021 1341   CREATININE 1.18 (H) 07/24/2021 1341   CALCIUM 9.1 07/24/2021 1341   CALCIUM 8.9 08/04/2017 1032   PROT 8.3 (H) 07/24/2021 1341   PROT 8.1 12/16/2015 1625   ALBUMIN 2.9 (L) 07/24/2021 1341   ALBUMIN 3.7 12/16/2015 1625   AST 42 (H) 07/24/2021 1341  ALT 57 (H) 07/24/2021 1341   ALKPHOS 384 (H) 07/24/2021 1341   BILITOT 0.7 07/24/2021 1341   BILITOT 0.2 12/16/2015 1625   GFRNONAA 50 (L) 07/24/2021 1341   GFRAA 54 (L) 09/05/2019 1021     Diabetic Labs (most recent): Lab Results  Component Value Date   HGBA1C 9.2 (H)  07/24/2021   HGBA1C 8.3 (H) 05/17/2021   HGBA1C 8.3 (H) 04/03/2021   MICROALBUR 5,373.1 (H) 04/03/2021   MICROALBUR 598.9 (H) 12/04/2019     Lipid Panel ( most recent) Lipid Panel     Component Value Date/Time   CHOL 203 (H) 07/24/2021 1342   TRIG 104 07/24/2021 1342   HDL 63 07/24/2021 1342   CHOLHDL 3.2 07/24/2021 1342   VLDL 21 07/24/2021 1342   LDLCALC 119 (H) 07/24/2021 1342      Lab Results  Component Value Date   TSH 2.213 07/24/2021   TSH 3.483 04/03/2021   TSH 2.253 11/28/2020   TSH 0.660 09/30/2020   TSH 0.559 06/28/2020   TSH 2.715 12/04/2019   TSH 1.267 06/05/2019   TSH 0.133 (L) 03/06/2019   TSH 0.939 12/06/2018   TSH 0.617 08/30/2018           Assessment & Plan:   1) Type 2 diabetes mellitus with stage 3a chronic kidney disease, with long-term current use of insulin ([place])  She presents today for her consultation, with her CGM showing drastically fluctuating glycemic profile.  Her most recent A1c on 07/24/21 was 9.2%.  She drinks flavored water, diet soda, and lemonade daily.  She does not have any eating pattern, eats mostly 2 meals per day with snacks between.  She does not routinely exercise due to mobility limitations other than chair/bed exercises.  She is UTD on eye exam and has seen podiatry in the past.  Analysis of her CGM shows TIR 29%, TAR 71%, TBR 0% with a GMI of 8.7%.  Stephanie [name] has currently uncontrolled symptomatic type 2 DM since 71 years of age, with most recent A1c of 9.2 %.   -Recent labs reviewed.  - I had a long discussion with her about the progressive nature of diabetes and the pathology behind its complications. -her diabetes is complicated by CHF, CKD stage 3, CVA x 5 and she remains at a high risk for more acute and chronic complications which include CAD, CVA, CKD, retinopathy, and neuropathy. These are all discussed in detail with her.  The following Lifestyle Medicine recommendations according to [facility]) were discussed and offered to patient and she agrees to start the journey:  A. Whole Foods, Plant-based plate comprising of fruits and vegetables, plant-based proteins, whole-grain carbohydrates was discussed in detail with the patient.   A list for source of those nutrients were also provided to the patient.  Patient will use only water or unsweetened tea for hydration. B.  The need to stay away from risky substances including alcohol, smoking; obtaining 7 to 9 hours of restorative sleep, at least 150 minutes of moderate intensity exercise weekly, the importance of healthy social connections,  and stress reduction techniques were discussed. C.  A full color page of  Calorie density of various food groups per pound showing examples of each food groups was provided to the patient.  - I have counseled her on diet and weight management by adopting a carbohydrate restricted/protein rich diet. Patient is encouraged to switch to unprocessed or minimally processed complex starch and increased protein  intake (animal or plant source), fruits, and vegetables. -  she is advised to stick to a routine mealtimes to eat 3 meals a day and avoid unnecessary snacks (to snack only to correct hypoglycemia).   - she acknowledges that there is a room for improvement in her food and drink choices. - Suggestion is made for her to avoid simple carbohydrates from her diet including Cakes, Sweet Desserts, Ice Cream, Soda (diet and regular), Sweet Tea, Candies, Chips, Cookies, Store Bought Juices, Alcohol in Excess of 1-2 drinks a day, Artificial Sweeteners, Coffee Creamer, and "Sugar-free" Products. This will help patient to have more stable blood glucose profile and potentially avoid unintended weight gain.  - I have approached her with the following individualized plan to manage her diabetes and patient agrees:   -She is advised to change her Xultophy 40 units SQ daily to morning administration, lower  her Humalog to 8-14 units TID with meals if glucose is above 90 and she is eating(Specific instructions on how to titrate insulin dosage based on glucose readings given to patient in writing).  She demonstrated her ability to correctly use the SSI with me today.  Will likely switch from [place] to long acting insulin daily for cost control.  -she is encouraged to start monitoring glucose 4 times daily, before meals and before bed, to log their readings on the clinic sheets provided, and bring them to review at follow up appointment in 2 weeks.  - she is warned not to take insulin without proper monitoring per orders. - Adjustment parameters are given to her for hypo and hyperglycemia in writing. - she is encouraged to call clinic for blood glucose levels less than 70 or above 300 mg /dl.  - she is advised to continue Jardiance 20, therapeutically suitable for patient given her mild CKD and CHF.  However, we may need to stop this in the future if she continues to have problems with vaginal yeast.  - she is not a candidate for Metformin due to concurrent renal insufficiency.  - Specific targets for  A1c; LDL, HDL, and Triglycerides were discussed with the patient.  2) Blood Pressure /Hypertension:  her blood pressure is controlled to target.   she is advised to continue her current medications including Bisoprolol 10 mg p.o. daily with breakfast.  3) Lipids/Hyperlipidemia:    Review of her recent lipid panel from 07/24/21 showed uncontrolled LDL at 119 .  she is advised to continue Lipitor 80 mg daily at bedtime.  Side effects and precautions discussed with her.  4)  Weight/Diet:  her Body mass index is 34.21 kg/m.  -  clearly complicating her diabetes care.   she is a candidate for weight loss. I discussed with her the fact that loss of 5 - 10% of her  current body weight will have the most impact on her diabetes management.  Exercise, and detailed carbohydrates information provided  -  detailed  on discharge instructions.  5) Chronic Care/Health Maintenance: -she is not on ACEI/ARB and is on Statin medications and is encouraged to initiate and continue to follow up with Ophthalmology, Dentist, Podiatrist at least yearly or according to recommendations, and advised to stay away from smoking. I have recommended yearly flu vaccine and pneumonia vaccine at least every 5 years; moderate intensity exercise for up to 150 minutes weekly; and sleep for at least 7 hours a day.  - she is advised to maintain close follow up with [name], FNP for primary care needs, as  well as her other providers for optimal and coordinated care.   - Time spent in this patient care: 60 min, of which > 50% was spent in counseling her about her diabetes and the rest reviewing her blood glucose logs, discussing her hypoglycemia and hyperglycemia episodes, reviewing her current and previous labs/studies (including abstraction from other facilities) and medications doses and developing a long term treatment plan based on the latest standards of care/guidelines; and documenting her care.    Please refer to Patient Instructions for Blood Glucose Monitoring and Insulin/Medications Dosing Guide" in media tab for additional information. Please also refer to "Patient Self Inventory" in the Media tab for reviewed elements of pertinent patient history.  Stephanie [name] participated in the discussions, expressed understanding, and voiced agreement with the above plans.  All questions were answered to her satisfaction. she is encouraged to contact clinic should she have any questions or concerns prior to her return visit.     Follow up plan: - Return in about 2 weeks (around 08/11/2021) for Diabetes F/U, Bring meter and logs.    Rayetta Pigg, [facility] [address] [place], [place] [address] Phone: [phone] Fax: [phone]  07/28/2021, 4:39 PM

## 2021-07-28 NOTE — Patient Instructions (Signed)

## 2021-07-31 ENCOUNTER — Other Ambulatory Visit (HOSPITAL_COMMUNITY): Payer: Self-pay | Admitting: Nephrology

## 2021-07-31 ENCOUNTER — Other Ambulatory Visit: Payer: Self-pay | Admitting: Nephrology

## 2021-07-31 DIAGNOSIS — D472 Monoclonal gammopathy: Secondary | ICD-10-CM

## 2021-07-31 DIAGNOSIS — E1122 Type 2 diabetes mellitus with diabetic chronic kidney disease: Secondary | ICD-10-CM

## 2021-08-12 NOTE — Patient Instructions (Signed)
Diabetes Mellitus and Standards of [place] with and managing diabetes (diabetes mellitus) can be complicated. Your diabetes treatment may be managed by a team of health care providers, including: A physician who specializes in diabetes (endocrinologist). You might also have visits with a nurse practitioner or physician assistant. Nurses. A registered dietitian. A certified diabetes care and education specialist. An exercise specialist. A pharmacist. An eye doctor. A foot specialist (podiatrist). A dental care provider. A primary care provider. A mental health care provider. How to manage your diabetes You can do many things to successfully manage your diabetes. Your health care providers will follow guidelines to help you get the best quality of care. Here are general guidelines for your diabetes management plan. Your health care providers may give you more specific instructions. Physical exams When you are diagnosed with diabetes, and each year after that, your health care provider will ask about your medical and family history. You will have a physical exam, which may include: Measuring your height, weight, and body mass index (BMI). Checking your blood pressure. This will be done at every routine medical visit. Your target blood pressure may vary depending on your medical conditions, your age, and other factors. A thyroid exam. A skin exam. Screening for nerve damage (peripheral neuropathy). This may include checking the pulse in your legs and feet and the level of sensation in your hands and feet. A foot exam to inspect the structure and skin of your feet, including checking for cuts, bruises, redness, blisters, sores, or other problems. Screening for blood vessel (vascular) problems. This may include checking the pulse in your legs and feet and checking your temperature. Blood tests Depending on your treatment plan and your personal needs, you may have the following  tests: Hemoglobin A1C (HbA1C). This test provides information about blood sugar (glucose) control over the previous 2-3 months. It is used to adjust your treatment plan, if needed. This test will be done: At least 2 times a year, if you are meeting your treatment goals. 4 times a year, if you are not meeting your treatment goals or if your goals have changed. Lipid testing, including total cholesterol, LDL and HDL cholesterol, and triglyceride levels. The goal for LDL is less than 100 mg/dL (5.5 mmol/L). If you are at high risk for complications, the goal is less than 70 mg/dL (3.9 mmol/L). The goal for HDL is 40 mg/dL (2.2 mmol/L) or higher for men, and 50 mg/dL (2.8 mmol/L) or higher for women. An HDL cholesterol of 60 mg/dL (3.3 mmol/L) or higher gives some protection against heart disease. The goal for triglycerides is less than 150 mg/dL (8.3 mmol/L). Liver function tests. Kidney function tests. Thyroid function tests.  Dental and eye exams  Visit your dentist two times a year. If you have type 1 diabetes, your health care provider may recommend an eye exam within 5 years after you are diagnosed, and then once a year after your first exam. For children with type 1 diabetes, the health care provider may recommend an eye exam when your child is age 66 or older and has had diabetes for 3-5 years. After the first exam, your child should get an eye exam once a year. If you have type 2 diabetes, your health care provider may recommend an eye exam as soon as you are diagnosed, and then every 1-2 years after your first exam. Immunizations A yearly flu (influenza) vaccine is recommended annually for everyone 6 months or older. This is especially  important if you have diabetes. The pneumonia (pneumococcal) vaccine is recommended for everyone 2 years or older who has diabetes. If you are age 25 or older, you may get the pneumonia vaccine as a series of two separate shots. The hepatitis B vaccine is  recommended for adults shortly after being diagnosed with diabetes. Adults and children with diabetes should receive all other vaccines according to age-specific recommendations from the Centers for Disease Control and Prevention (CDC). Mental and emotional health Screening for symptoms of eating disorders, anxiety, and depression is recommended at the time of diagnosis and after as needed. If your screening shows that you have symptoms, you may need more evaluation. You may work with a mental health care provider. Follow these instructions at home: Treatment plan You will monitor your blood glucose levels and may give yourself insulin. Your treatment plan will be reviewed at every medical visit. You and your health care provider will discuss: How you are taking your medicines, including insulin. Any side effects you have. Your blood glucose level target goals. How often you monitor your blood glucose level. Lifestyle habits, such as activity level and tobacco, alcohol, and substance use. Education Your health care provider will assess how well you are monitoring your blood glucose levels and whether you are taking your insulin and medicines correctly. He or she may refer you to: A certified diabetes care and education specialist to manage your diabetes throughout your life, starting at diagnosis. A registered dietitian who can create and review your personal nutrition plan. An exercise specialist who can discuss your activity level and exercise plan. General instructions Take over-the-counter and prescription medicines only as told by your health care provider. Keep all follow-up visits. This is important. Where to find support There are many diabetes support networks, including: American Diabetes Association (ADA): diabetes.org Defeat Diabetes Foundation: defeatdiabetes.org Where to find more information American Diabetes Association (ADA): www.diabetes.org Association of Diabetes Care &  Education Specialists (ADCES): diabeteseducator.org International Diabetes Federation (IDF): https://www.munoz-bell.org/ Summary Managing diabetes (diabetes mellitus) can be complicated. Your diabetes treatment may be managed by a team of health care providers. Your health care providers follow guidelines to help you get the best quality care. You should have physical exams, blood tests, blood pressure monitoring, immunizations, and screening tests regularly. Stay updated on how to manage your diabetes. Your health care providers may also give you more specific instructions based on your individual health. This information is not intended to replace advice given to you by your health care provider. Make sure you discuss any questions you have with your health care provider. Document Revised: 08/10/2019 Document Reviewed: 08/10/2019 [employer] Patient Education  [place].

## 2021-08-13 ENCOUNTER — Encounter: Payer: Self-pay | Admitting: Nurse Practitioner

## 2021-08-13 ENCOUNTER — Ambulatory Visit (INDEPENDENT_AMBULATORY_CARE_PROVIDER_SITE_OTHER): Payer: Medicare Other | Admitting: Nurse Practitioner

## 2021-08-13 VITALS — BP 134/86 | HR 82 | Ht 68.0 in | Wt 224.0 lb

## 2021-08-13 DIAGNOSIS — E1122 Type 2 diabetes mellitus with diabetic chronic kidney disease: Secondary | ICD-10-CM

## 2021-08-13 DIAGNOSIS — Z794 Long term (current) use of insulin: Secondary | ICD-10-CM

## 2021-08-13 DIAGNOSIS — I10 Essential (primary) hypertension: Secondary | ICD-10-CM

## 2021-08-13 DIAGNOSIS — E782 Mixed hyperlipidemia: Secondary | ICD-10-CM

## 2021-08-13 DIAGNOSIS — N1831 Chronic kidney disease, stage 3a: Secondary | ICD-10-CM | POA: Diagnosis not present

## 2021-08-13 NOTE — Progress Notes (Signed)
Endocrinology Consult Note       08/13/2021, 4:01 PM   Subjective:    [name], female    DOB: 08-14-1950.  Stephanie [name] is being seen in consultation for management of currently uncontrolled symptomatic diabetes requested by  Stephanie [name], [place].   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   Type 2 diabetes mellitus ([place])     Past Surgical History:  Procedure Laterality Date   ABDOMINAL HYSTERECTOMY  partial   CARPAL TUNNEL RELEASE Right 1991   CATARACT EXTRACTION W/PHACO Right 05/08/2013   Procedure: CATARACT EXTRACTION PHACO AND INTRAOCULAR LENS PLACEMENT ([place]);  Surgeon: Tonny Branch, MD;  Location: [facility];  Service: Ophthalmology;  Laterality: Right;  CDE 10.31   CATARACT EXTRACTION W/PHACO Left 08/17/2013   Procedure: CATARACT EXTRACTION PHACO AND INTRAOCULAR LENS PLACEMENT ([facility]);  Surgeon: Tonny Branch, MD;  Location: [facility];  Service: Ophthalmology;  Laterality: Left;  CDE:9.03   CHOLECYSTECTOMY  1971   COLONOSCOPY WITH PROPOFOL N/A 01/06/2016   Dr. Gala Romney: diverticulosis    DENTAL SURGERY     ESOPHAGEAL BRUSHING  08/29/2019   Procedure: ESOPHAGEAL BRUSHING;  Surgeon: Daneil Dolin, MD;  Location: [facility];  Service: Endoscopy;;   ESOPHAGOGASTRODUODENOSCOPY (EGD) WITH PROPOFOL N/A 01/06/2016   Dr. Gala Romney: normal s/p empiric dilation    ESOPHAGOGASTRODUODENOSCOPY (EGD) WITH PROPOFOL N/A 08/29/2019   esophageal plaques vs  medication residue adherent to tubular esophagus s/p KOH brushing and dilation. Medium-sized hiatal hernia. + for candida. Diflucan.    EYE SURGERY     MALONEY DILATION N/A 01/06/2016   Procedure: Venia Minks DILATION;  Surgeon: Daneil Dolin, MD;  Location: [facility];  Service: Endoscopy;  Laterality: N/A;   MALONEY DILATION N/A 08/29/2019   Procedure: Venia Minks DILATION;  Surgeon: Daneil Dolin, MD;  Location: [facility];  Service: Endoscopy;  Laterality: N/A;   REVERSE SHOULDER ARTHROPLASTY Right 08/06/2017   Procedure: RIGHT REVERSE SHOULDER ARTHROPLASTY;  Surgeon: Netta Cedars, MD;  Location: [facility];  Service: Orthopedics;  Laterality: Right;   WRIST GANGLION EXCISION Left     Social History   Socioeconomic History   Marital status: Married    Spouse name: Not on file   Number of children: Not on file   Years of education: Not on file   Highest education level: Not on file  Occupational History   Not on file  Tobacco Use   Smoking status: Former    Packs/day: 0.25    Years: 30.00    Total pack years: 7.50    Types: Cigarettes    Quit date: 02/17/2011    Years since  quitting: 10.4   Smokeless tobacco: Never  Vaping Use   Vaping Use: Never used  Substance and Sexual Activity   Alcohol use: Not Currently    Comment: rare   Drug use: No   Sexual activity: Not Currently    Birth control/protection: Surgical  Other Topics Concern   Not on file  Social History Narrative   Not on file   Social Determinants of Health   Financial Resource Strain: Not on file  Food Insecurity: Not on file  Transportation Needs: Not on file  Physical Activity: Not on file  Stress: Not on file  Social Connections: Not on file    Family History  Problem Relation Age of Onset   Hypertension Mother    Diabetes Mother    COPD Mother    Arthritis Mother    Diabetes Father    Arthritis Father    Dementia Father    CAD Father    Hypothyroidism Sister    Diabetes Brother    Colon  cancer Niece    Colon polyps Neg Hx     Outpatient Encounter Medications as of 08/13/2021  Medication Sig   empagliflozin (JARDIANCE) 10 MG TABS tablet Take 20 mg by mouth daily.   Accu-Chek FastClix Lancets MISC Apply topically.   ACCU-CHEK GUIDE test strip 4 (four) times daily.   acetaminophen (TYLENOL) 500 MG tablet Take 1,000 mg by mouth every 6 (six) hours as needed for moderate pain or headache.   albuterol (PROVENTIL) (2.5 MG/3ML) 0.083% nebulizer solution Take 2.5 mg by nebulization every 6 (six) hours as needed for wheezing or shortness of breath.   albuterol (VENTOLIN HFA) 108 (90 Base) MCG/ACT inhaler Inhale 1-2 puffs into the lungs every 6 (six) hours as needed for wheezing or shortness of breath.   Alcohol Swabs (ALCOHOL PADS) 70 % PADS SMARTSIG:Pledget(s) Topical 4 Times Daily   apixaban (ELIQUIS) 5 MG TABS tablet Take 1 tablet (5 mg total) by mouth 2 (two) times daily.   ARNUITY ELLIPTA 100 MCG/ACT AEPB Inhale 1 puff into the lungs at bedtime.    Ascorbic Acid (VITAMIN C) 1000 MG tablet Take 500 mg by mouth daily.   atorvastatin (LIPITOR) 80 MG tablet Take 1 tablet (80 mg total) by mouth daily.   bisoprolol (ZEBETA) 10 MG tablet Take 1 tablet (10 mg total) by mouth in the morning and at bedtime.   Blood Glucose Calibration (ACCU-CHEK GUIDE CONTROL) LIQD See admin instructions.   Blood Glucose Monitoring Suppl (ACCU-CHEK GUIDE ME) w/Device KIT 4 (four) times daily.   Cetirizine HCl 10 MG CAPS cetirizine 10 mg capsule  Take by oral route.   Cholecalciferol (VITAMIN D) 2000 units tablet Take 4,000 Units by mouth daily.    cimetidine (TAGAMET) 200 MG tablet Take 0.5 tablets (100 mg total) by mouth daily as needed. (Patient taking differently: Take 100 mg by mouth daily as needed (indigestion).)   CINNAMON PO Take 2 capsules by mouth 3 (three) times daily.   clobetasol ointment (TEMOVATE) 0.93 % Apply 1 application topically 3 (three) times daily as needed (imflammation).    diphenhydrAMINE (BENADRYL) 25 mg capsule 2 capsules every 4 (four) hours   DULoxetine (CYMBALTA) 30 MG capsule Take 1 capsule (30 mg total) by mouth 2 (two) times daily.   EASY COMFORT PEN NEEDLES 31G X 5 MM MISC INJECT IN SULIN EVERY DAY AS DIRECTED   fluticasone (VERAMYST) 27.5 MCG/SPRAY nasal spray Place 2 sprays into the nose daily.   guaiFENesin (ROBITUSSIN) 100 MG/5ML SOLN  Take 5 mLs (100 mg total) by mouth every 4 (four) hours as needed for cough or to loosen phlegm.   HUMALOG KWIKPEN 100 UNIT/ML KiwkPen Inject 8-14 Units into the skin See admin instructions.   hydrOXYzine (ATARAX/VISTARIL) 25 MG tablet Take 25 mg by mouth every 8 (eight) hours as needed for itching.    ipratropium-albuterol (DUONEB) 0.5-2.5 (3) MG/3ML SOLN Take 3 mLs by nebulization every 6 (six) hours as needed (shortness of breath).   JARDIANCE 10 MG TABS tablet Take 20 mg by mouth every morning.   Lancet Devices (EASY MINI EJECT LANCING DEVICE) MISC 4 (four) times daily.   levothyroxine (SYNTHROID) 88 MCG tablet Take 88 mcg by mouth daily before breakfast.   lubiprostone (AMITIZA) 8 MCG capsule TAKE 1 CAPSULE(8 MCG) BY MOUTH TWICE DAILY WITH A MEAL   LYRICA 75 MG capsule Take 75 mg by mouth 2 (two) times daily.    methocarbamol (ROBAXIN) 500 MG tablet Take 1 tablet (500 mg total) by mouth 3 (three) times daily as needed. (Patient taking differently: Take 500 mg by mouth 3 (three) times daily as needed for muscle spasms.)   montelukast (SINGULAIR) 10 MG tablet Take 1 tablet by mouth every evening.   omega-3 acid ethyl esters (LOVAZA) 1 g capsule Take 2 capsules (2 g total) by mouth 2 (two) times daily.   pantoprazole (PROTONIX) 40 MG tablet Take 1 tablet (40 mg total) by mouth daily.   potassium chloride (KLOR-CON) 10 MEQ tablet Take 10 mEq by mouth daily.   RESTASIS 0.05 % ophthalmic emulsion Place 1 drop into both eyes 2 (two) times daily.   torsemide (DEMADEX) 20 MG tablet Take 2 tablets (40 mg total) by mouth daily.    traMADol (ULTRAM) 50 MG tablet Take 50 mg by mouth 3 (three) times daily as needed for moderate pain.   traZODone (DESYREL) 50 MG tablet Take 50-100 mg by mouth at bedtime as needed for sleep.   vitamin B-12 (CYANOCOBALAMIN) 1000 MCG tablet Take 1,000 mcg by mouth daily.   XULTOPHY 100-3.6 UNIT-MG/ML SOPN Inject 85 Units as directed at bedtime. (Patient taking differently: Inject 40 Units as directed at bedtime.)   No facility-administered encounter medications on file as of 08/13/2021.    ALLERGIES: Allergies  Allergen Reactions   Tetracyclines & Related Anaphylaxis and Rash   Banana Hives and Nausea And Vomiting   Penicillins Rash and Other (See Comments)    Has patient had a PCN reaction causing immediate rash, facial/tongue/throat swelling, SOB or lightheadedness with hypotension: No Has patient had a PCN reaction causing severe rash involving mucus membranes or skin necrosis: No Has patient had a PCN reaction that required hospitalization No Has patient had a PCN reaction occurring within the last 10 years: No If all of the above answers are "NO", then may proceed with Cephalosporin use.     VACCINATION STATUS: Immunization History  Administered Date(s) Administered   Fluad Quad(high Dose 65+) 11/08/2018, 12/12/2020   Influenza,inj,Quad PF,6+ Mos 12/24/2015   Influenza-Unspecified 12/02/2016   Pneumococcal Polysaccharide-23 10/22/2013    Diabetes She presents for her follow-up diabetic visit. She has type 2 diabetes mellitus. Onset time: Diagnosed at approx age of 29. Her disease course has been fluctuating. There are no hypoglycemic associated symptoms. Associated symptoms include fatigue, polydipsia and polyuria. There are no hypoglycemic complications. Symptoms are stable. Diabetic complications include a CVA, heart disease and nephropathy. Risk factors for coronary artery disease include diabetes mellitus, dyslipidemia, family history, hypertension, post-menopausal and  sedentary lifestyle.  Current diabetic treatment includes intensive insulin program and oral agent (monotherapy) (Xultophy 40 units nightly, Humalog TID and Jardiance 20 mg po daily). She is compliant with treatment most of the time. Her weight is fluctuating minimally. She is following a generally healthy diet. When asked about meal planning, she reported none. She has not had a previous visit with a dietitian. She rarely participates in exercise. Her home blood glucose trend is fluctuating dramatically. Her overall blood glucose range is >200 mg/dl. (She presents today with her CGM showing continued fluctuating glycemic profile.  She was not due for another A1c today.  Analysis of her CGM shows TIR 40%, TAR 59%, TBR 1% with a GMI of 8.1%.  She has had less hypoglycemia.  She notes she still struggles with eating on a routine pattern but has been choosing more healthy foods.) An ACE inhibitor/angiotensin II receptor blocker is not being taken. She sees a podiatrist.Eye exam is current.     Review of systems  Constitutional: + Minimally fluctuating body weight, current Body mass index is 34.06 kg/m., no fatigue, no subjective hyperthermia, no subjective hypothermia Eyes: no blurry vision, no xerophthalmia ENT: no sore throat, no nodules palpated in throat, no dysphagia/odynophagia, no hoarseness Cardiovascular: no chest pain, no shortness of breath, no palpitations, no leg swelling Respiratory: no cough, no shortness of breath Gastrointestinal: no nausea/vomiting/diarrhea Musculoskeletal: no muscle/joint aches, walks with cane Skin: no rashes, no hyperemia Neurological: no tremors, no numbness, no tingling, no dizziness Psychiatric: no depression, no anxiety  Objective:     BP 134/86   Pulse 82   Ht _0  (1.727 m)   Wt 224 lb (101.6 kg)   BMI 34.06 kg/m   Wt Readings from Last 3 Encounters:  08/13/21 224 lb (101.6 kg)  07/28/21 225 lb (102.1 kg)  06/23/21 221 lb 6.4 oz (100.4 kg)      BP Readings from Last 3 Encounters:  08/13/21 134/86  07/28/21 140/80  06/23/21 130/84     Physical Exam- Limited  Constitutional:  Body mass index is 34.06 kg/m. , not in acute distress, normal state of mind Eyes:  EOMI, no exophthalmos Neck: Supple Cardiovascular: RRR, no murmurs, rubs, or gallops, no edema Respiratory: Adequate breathing efforts, no crackles, rales, rhonchi, or wheezing Musculoskeletal: no gross deformities, strength intact in all four extremities, no gross restriction of joint movements, walks with cane Skin:  no rashes, no hyperemia Neurological: no tremor with outstretched hands  Diabetic Foot Exam - Simple   Simple Foot Form Diabetic Foot exam was performed with the following findings: Yes 08/13/2021  4:01 PM  Visual Inspection See comments: Yes Sensation Testing See comments: Yes Pulse Check Posterior Tibialis and Dorsalis pulse intact bilaterally: Yes Comments Right foot third toe was bleeding (she stubbed her toe before coming in today); no sensation to monofilament tool.      CMP ( most recent) CMP     Component Value Date/Time   NA 137 07/24/2021 1341   K 5.0 07/24/2021 1341   CL 106 07/24/2021 1341   CO2 23 07/24/2021 1341   GLUCOSE 225 (H) 07/24/2021 1341   BUN 22 07/24/2021 1341   CREATININE 1.18 (H) 07/24/2021 1341   CALCIUM 9.1 07/24/2021 1341   CALCIUM 8.9 08/04/2017 1032   PROT 8.3 (H) 07/24/2021 1341   PROT 8.1 12/16/2015 1625   ALBUMIN 2.9 (L) 07/24/2021 1341   ALBUMIN 3.7 12/16/2015 1625   AST 42 (H) 07/24/2021 1341   ALT 57 (H) 07/24/2021 1341  ALKPHOS 384 (H) 07/24/2021 1341   BILITOT 0.7 07/24/2021 1341   BILITOT 0.2 12/16/2015 1625   GFRNONAA 50 (L) 07/24/2021 1341   GFRAA 54 (L) 09/05/2019 1021     Diabetic Labs (most recent): Lab Results  Component Value Date   HGBA1C 9.2 (H) 07/24/2021   HGBA1C 8.3 (H) 05/17/2021   HGBA1C 8.3 (H) 04/03/2021   MICROALBUR 5,373.1 (H) 04/03/2021   MICROALBUR 598.9 (H)  12/04/2019     Lipid Panel ( most recent) Lipid Panel     Component Value Date/Time   CHOL 203 (H) 07/24/2021 1342   TRIG 104 07/24/2021 1342   HDL 63 07/24/2021 1342   CHOLHDL 3.2 07/24/2021 1342   VLDL 21 07/24/2021 1342   LDLCALC 119 (H) 07/24/2021 1342      Lab Results  Component Value Date   TSH 2.213 07/24/2021   TSH 3.483 04/03/2021   TSH 2.253 11/28/2020   TSH 0.660 09/30/2020   TSH 0.559 06/28/2020   TSH 2.715 12/04/2019   TSH 1.267 06/05/2019   TSH 0.133 (L) 03/06/2019   TSH 0.939 12/06/2018   TSH 0.617 08/30/2018           Assessment & Plan:   1) Type 2 diabetes mellitus with stage 3a chronic kidney disease, with long-term current use of insulin ([place])  She presents today with her CGM showing continued fluctuating glycemic profile.  She was not due for another A1c today.  Analysis of her CGM shows TIR 40%, TAR 59%, TBR 1% with a GMI of 8.1%.  She has had less hypoglycemia.  She notes she still struggles with eating on a routine pattern but has been choosing more healthy foods.  Stephanie [name] has currently uncontrolled symptomatic type 2 DM since 71 years of age, with most recent A1c of 9.2 %.   -Recent labs reviewed.  - I had a long discussion with her about the progressive nature of diabetes and the pathology behind its complications. -her diabetes is complicated by CHF, CKD stage 3, CVA x 5 and she remains at a high risk for more acute and chronic complications which include CAD, CVA, CKD, retinopathy, and neuropathy. These are all discussed in detail with her.  The following Lifestyle Medicine recommendations according to [facility]) were discussed and offered to patient and she agrees to start the journey:  A. Whole Foods, Plant-based plate comprising of fruits and vegetables, plant-based proteins, whole-grain carbohydrates was discussed in detail with the patient.   A list for source of those nutrients were also provided  to the patient.  Patient will use only water or unsweetened tea for hydration. B.  The need to stay away from risky substances including alcohol, smoking; obtaining 7 to 9 hours of restorative sleep, at least 150 minutes of moderate intensity exercise weekly, the importance of healthy social connections,  and stress reduction techniques were discussed. C.  A full color page of  Calorie density of various food groups per pound showing examples of each food groups was provided to the patient.  - Nutritional counseling repeated at each appointment due to patients tendency to fall back in to old habits.  - The patient admits there is a room for improvement in their diet and drink choices. -  Suggestion is made for the patient to avoid simple carbohydrates from their diet including Cakes, Sweet Desserts / Pastries, Ice Cream, Soda (diet and regular), Sweet Tea, Candies, Chips, Cookies, Sweet Pastries, Store Bought Juices,  Alcohol in Excess of 1-2 drinks a day, Artificial Sweeteners, Coffee Creamer, and "Sugar-free" Products. This will help patient to have stable blood glucose profile and potentially avoid unintended weight gain.   - I encouraged the patient to switch to unprocessed or minimally processed complex starch and increased protein intake (animal or plant source), fruits, and vegetables.   - Patient is advised to stick to a routine mealtimes to eat 3 meals a day and avoid unnecessary snacks (to snack only to correct hypoglycemia).  - I have approached her with the following individualized plan to manage her diabetes and patient agrees:   -She is advised to continue her Xultophy 40 units SQ daily with breakfast, and Humalog  8-14 units TID with meals if glucose is above 90 and she is eating( Specific instructions on how to titrate insulin dosage based on glucose readings given to patient in writing).    Will likely switch from Xultophy to long acting insulin daily for cost control once she is  finished with her current supply.  She can also continue her Jardiance 20 mg po daily.  However, we may need to stop this in the future if she continues to have problems with vaginal yeast.  -she is encouraged to continue monitoring glucose 4 times daily (using her CGM), before meals and before bed, and to call the clinic if she has readings less than 70 or above 300 for 3 tests in a row.  - she is warned not to take insulin without proper monitoring per orders. - Adjustment parameters are given to her for hypo and hyperglycemia in writing.  - she is not a candidate for Metformin due to concurrent renal insufficiency.  - Specific targets for  A1c; LDL, HDL, and Triglycerides were discussed with the patient.  2) Blood Pressure /Hypertension:  her blood pressure is controlled to target.   she is advised to continue her current medications including Bisoprolol 10 mg p.o. daily with breakfast.  3) Lipids/Hyperlipidemia:    Review of her recent lipid panel from 07/24/21 showed uncontrolled LDL at 119 .  she is advised to continue Lipitor 80 mg daily at bedtime.  Side effects and precautions discussed with her.  4)  Weight/Diet:  her Body mass index is 34.06 kg/m.  -  clearly complicating her diabetes care.   she is a candidate for weight loss. I discussed with her the fact that loss of 5 - 10% of her  current body weight will have the most impact on her diabetes management.  Exercise, and detailed carbohydrates information provided  -  detailed on discharge instructions.  5) Chronic Care/Health Maintenance: -she is not on ACEI/ARB and is on Statin medications and is encouraged to initiate and continue to follow up with Ophthalmology, Dentist, Podiatrist at least yearly or according to recommendations, and advised to stay away from smoking. I have recommended yearly flu vaccine and pneumonia vaccine at least every 5 years; moderate intensity exercise for up to 150 minutes weekly; and sleep for at least  7 hours a day.  - she is advised to maintain close follow up with Stephanie Robert, FNP for primary care needs, as well as her other providers for optimal and coordinated care.     I spent 42 minutes in the care of the patient today including review of labs from [facility], Lipids, Thyroid Function, Hematology (current and previous including abstractions from other facilities); face-to-face time discussing  her blood glucose readings/logs, discussing hypoglycemia and hyperglycemia episodes and symptoms,  medications doses, her options of short and long term treatment based on the latest standards of care / guidelines;  discussion about incorporating lifestyle medicine;  and documenting the encounter. Risk reduction counseling performed per USPSTF guidelines to reduce obesity and cardiovascular risk factors.     Please refer to Patient Instructions for Blood Glucose Monitoring and Insulin/Medications Dosing Guide"  in media tab for additional information. Please  also refer to " Patient Self Inventory" in the Media  tab for reviewed elements of pertinent patient history.  Stephanie [name] participated in the discussions, expressed understanding, and voiced agreement with the above plans.  All questions were answered to her satisfaction. she is encouraged to contact clinic should she have any questions or concerns prior to her return visit.     Follow up plan: - Return in about 3 months (around 11/13/2021) for Diabetes F/U with A1c in office, No previsit labs, Bring meter and logs.    Rayetta Pigg, [facility] [address] [place], [place] [address] Phone: [phone] Fax: [phone]  08/13/2021, 4:01 PM

## 2021-08-21 ENCOUNTER — Ambulatory Visit (HOSPITAL_COMMUNITY): Payer: Medicare Other

## 2021-08-25 ENCOUNTER — Telehealth: Payer: Self-pay | Admitting: *Deleted

## 2021-08-25 ENCOUNTER — Telehealth: Payer: Self-pay | Admitting: Cardiology

## 2021-08-25 NOTE — Telephone Encounter (Signed)
   Pre-operative Risk Assessment    Patient Name: Stephanie [name]  DOB: 1950-07-23 MRN: [id number]      Request for Surgical Clearance    Procedure:   kidney biopsy   Date of Surgery:  Clearance 08/25/21                                 Surgeon:  Dr. Dimas Chyle Group or Practice Name:  [facility]  Phone number:  [phone] Fax number:  [phone]   Type of Clearance Requested:   - Medical  - Pharmacy:  Hold Apixaban (Eliquis) 2 day prior    Type of Anesthesia:  Local    Additional requests/questions:      Eston Mould   08/25/2021, 10:46 AM

## 2021-08-25 NOTE — Telephone Encounter (Signed)
Pt agreeable to plan of care for tele pre op appt 08/26/21 @ 10 am . Med rec and consent are done. Pt did inform me the procedure was not being done today, but is set for 08/29/21 Friday. I thanked the pt for the update.     Patient Consent for Virtual Visit        [name] has provided verbal consent on 08/25/2021 for a virtual visit (video or telephone).   CONSENT FOR VIRTUAL VISIT FOR:  Stephanie [name]  By participating in this virtual visit I agree to the following:  I hereby voluntarily request, consent and authorize [facility] and its employed or contracted physicians, physician assistants, nurse practitioners or other licensed health care professionals (the Practitioner), to provide me with telemedicine health care services (the "Services") as deemed necessary by the treating Practitioner. I acknowledge and consent to receive the Services by the Practitioner via telemedicine. I understand that the telemedicine visit will involve communicating with the Practitioner through live audiovisual communication technology and the disclosure of certain medical information by electronic transmission. I acknowledge that I have been given the opportunity to request an in-person assessment or other available alternative prior to the telemedicine visit and am voluntarily participating in the telemedicine visit.  I understand that I have the right to withhold or withdraw my consent to the use of telemedicine in the course of my care at any time, without affecting my right to future care or treatment, and that the Practitioner or I may terminate the telemedicine visit at any time. I understand that I have the right to inspect all information obtained and/or recorded in the course of the telemedicine visit and may receive copies of available information for a reasonable fee.  I understand that some of the potential risks of receiving the Services via telemedicine include:  Delay or interruption in medical  evaluation due to technological equipment failure or disruption; Information transmitted may not be sufficient (e.g. poor resolution of images) to allow for appropriate medical decision making by the Practitioner; and/or  In rare instances, security protocols could fail, causing a breach of personal health information.  Furthermore, I acknowledge that it is my responsibility to provide information about my medical history, conditions and care that is complete and accurate to the best of my ability. I acknowledge that Practitioner's advice, recommendations, and/or decision may be based on factors not within their control, such as incomplete or inaccurate data provided by me or distortions of diagnostic images or specimens that may result from electronic transmissions. I understand that the practice of medicine is not an exact science and that Practitioner makes no warranties or guarantees regarding treatment outcomes. I acknowledge that a copy of this consent can be made available to me via my patient portal ([employer]), or I can request a printed copy by calling the office of [facility].    I understand that my insurance will be billed for this visit.   I have read or had this consent read to me. I understand the contents of this consent, which adequately explains the benefits and risks of the Services being provided via telemedicine.  I have been provided ample opportunity to ask questions regarding this consent and the Services and have had my questions answered to my satisfaction. I give my informed consent for the services to be provided through the use of telemedicine in my medical care

## 2021-08-25 NOTE — Telephone Encounter (Signed)
Pt agreeable to plan of care for tele pre op appt 08/26/21 @ 10 am . Med rec and consent are done. Pt did inform me the procedure was not being done today, but is set for 08/29/21 Friday. I thanked the pt for the update.

## 2021-08-25 NOTE — Telephone Encounter (Signed)
Primary Cardiologist:Samuel Domenic Polite, MD   Preoperative team, please contact this patient and set up a phone call appointment for further preoperative risk assessment. Please obtain consent and complete medication review. Thank you for your help.   Please notify requesting office that patient's Eliquis is prescribed for by PCP DVT. She does not have a history of atrial fibrillation.   Emmaline Life, NP-C    08/25/2021, 3:39 PM [place] [address] Office [phone] Fax [phone]

## 2021-08-26 ENCOUNTER — Encounter: Payer: Self-pay | Admitting: Nurse Practitioner

## 2021-08-26 ENCOUNTER — Ambulatory Visit (INDEPENDENT_AMBULATORY_CARE_PROVIDER_SITE_OTHER): Payer: Medicare Other | Admitting: Nurse Practitioner

## 2021-08-26 DIAGNOSIS — Z01810 Encounter for preprocedural cardiovascular examination: Secondary | ICD-10-CM | POA: Diagnosis not present

## 2021-08-26 NOTE — Telephone Encounter (Signed)
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 Cards note mentions recurrent DVT but do not see date of any recurrent DVT listed in Epic. Her primary care NP has been prescribing her Eliquis.   Procedure: kidney biopsy Date of procedure: 08/29/21  CrCl 32m/min using adjusted body weight due to obesity Platelet count 244K  Recommend reaching out to PCP who has been managing anticoagulation regarding length of Eliquis hold.

## 2021-08-26 NOTE — Progress Notes (Signed)
Virtual Visit via Telephone Note   Because of Stephanie [name]'s co-morbid illnesses, she is at least at moderate risk for complications without adequate follow up.  This format is felt to be most appropriate for this patient at this time.  The patient did not have access to video technology/had technical difficulties with video requiring transitioning to audio format only (telephone).  All issues noted in this document were discussed and addressed.  No physical exam could be performed with this format.  Please refer to the patient's chart for her consent to telehealth for [facility]  Evaluation Performed:  Preoperative cardiovascular risk assessment _____________   Date:  08/26/2021   Patient ID:  Stephanie [name], DOB October 15, 1950, MRN [id number] Patient Location:  Home Provider location:   Office  Primary Care Provider:  [name], FNP Primary Cardiologist:  Rozann Lesches, MD  Chief Complaint / Patient Profile   71 y.o. 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   Type 2 diabetes mellitus ([place])    Past Surgical History:  Procedure Laterality Date   ABDOMINAL HYSTERECTOMY  partial   CARPAL TUNNEL RELEASE Right 1991   CATARACT EXTRACTION W/PHACO Right 05/08/2013   Procedure: CATARACT  EXTRACTION PHACO AND INTRAOCULAR LENS PLACEMENT ([place]);  Surgeon: Tonny Branch, MD;  Location: [facility];  Service: Ophthalmology;  Laterality: Right;  CDE 10.31   CATARACT EXTRACTION W/PHACO Left 08/17/2013   Procedure: CATARACT EXTRACTION PHACO AND INTRAOCULAR LENS PLACEMENT (I[facility]);  Surgeon: Tonny Branch, MD;  Location: [facility];  Service: Ophthalmology;  Laterality: Left;  CDE:9.03   CHOLECYSTECTOMY  1971   COLONOSCOPY WITH PROPOFOL N/A 01/06/2016   Dr. Gala Romney: diverticulosis    DENTAL SURGERY     ESOPHAGEAL BRUSHING  08/29/2019   Procedure: ESOPHAGEAL BRUSHING;  Surgeon: Daneil Dolin, MD;  Location: [facility];  Service: Endoscopy;;   ESOPHAGOGASTRODUODENOSCOPY (EGD) WITH PROPOFOL N/A 01/06/2016   Dr. Gala Romney: normal s/p empiric dilation    ESOPHAGOGASTRODUODENOSCOPY (EGD) WITH PROPOFOL N/A 08/29/2019   esophageal plaques vs medication residue adherent to tubular esophagus s/p KOH brushing and dilation. Medium-sized hiatal hernia. + for candida. Diflucan.    EYE SURGERY     MALONEY DILATION N/A 01/06/2016   Procedure: Venia Minks DILATION;  Surgeon: Daneil Dolin, MD;  Location: [facility];  Service: Endoscopy;  Laterality: N/A;   MALONEY DILATION N/A 08/29/2019   Procedure: Venia Minks DILATION;  Surgeon: Daneil Dolin, MD;  Location: [facility];  Service: Endoscopy;  Laterality: N/A;   REVERSE SHOULDER ARTHROPLASTY Right 08/06/2017   Procedure: RIGHT REVERSE SHOULDER ARTHROPLASTY;  Surgeon: Netta Cedars, MD;  Location: [facility];  Service: Orthopedics;  Laterality: Right;   WRIST GANGLION EXCISION Left     Allergies  Allergies  Allergen  Reactions   Tetracyclines & Related Anaphylaxis and Rash   Banana Hives and Nausea And Vomiting   Penicillins Rash and Other (See Comments)    Has patient had a PCN reaction causing immediate rash, facial/tongue/throat swelling, SOB or lightheadedness with hypotension: No Has patient had a PCN reaction causing severe rash involving mucus membranes or skin  necrosis: No Has patient had a PCN reaction that required hospitalization No Has patient had a PCN reaction occurring within the last 10 years: No If all of the above answers are "NO", then may proceed with Cephalosporin use.     History of Present Illness    Stephanie [name] is a 71 y.o. female who presents via audio/video conferencing for a telehealth visit today.  Pt was last seen in cardiology clinic on 06/23/21 by Dr. Domenic Polite.  At that time Stephanie [name] was doing well.  The patient is now pending procedure as outlined above. Since her last visit, she  denies chest pain, shortness of breath, fatigue, melena, hematuria, hemoptysis, diaphoresis, weakness, presyncope, syncope, orthopnea, and PND. She reports palpitations are well-controlled currently and LE edema has improved since hospital d/c 05/21/21.  Reports her activity is limited by back pain but she remains somewhat active doing housework without chest pain or shortness of breath.  Home Medications    Prior to Admission medications   Medication Sig Start Date End Date Taking? Authorizing Provider  Accu-Chek FastClix Lancets MISC Apply topically. 03/15/20   [provider]  ACCU-CHEK GUIDE test strip 4 (four) times daily. 07/02/20   [provider]  acetaminophen (TYLENOL) 500 MG tablet Take 1,000 mg by mouth every 6 (six) hours as needed for moderate pain or headache.    [provider]  albuterol (PROVENTIL) (2.5 MG/3ML) 0.083% nebulizer solution Take 2.5 mg by nebulization every 6 (six) hours as needed for wheezing or shortness of breath.    [provider]  albuterol (VENTOLIN HFA) 108 (90 Base) MCG/ACT inhaler Inhale 1-2 puffs into the lungs every 6 (six) hours as needed for wheezing or shortness of breath. 03/21/21   Volney American, PA-C  Alcohol Swabs (ALCOHOL PADS) 70 % PADS SMARTSIG:Pledget(s) Topical 4 Times Daily 08/12/20   [provider]  apixaban (ELIQUIS) 5 MG TABS tablet Take 1  tablet (5 mg total) by mouth 2 (two) times daily. 05/21/21   Arrien, Jimmy Picket, MD  ARNUITY ELLIPTA 100 MCG/ACT AEPB Inhale 1 puff into the lungs at bedtime.  06/13/17   [provider]  Ascorbic Acid (VITAMIN C) 1000 MG tablet Take 500 mg by mouth daily.    [provider]  atorvastatin (LIPITOR) 80 MG tablet Take 1 tablet (80 mg total) by mouth daily. 05/22/21 06/24/58  Arrien, Jimmy Picket, MD  bisoprolol (ZEBETA) 10 MG tablet Take 1 tablet (10 mg total) by mouth in the morning and at bedtime. 01/02/21   Strader, Fransisco Hertz, PA-C  Blood Glucose Calibration (ACCU-CHEK GUIDE CONTROL) LIQD See admin instructions. 07/02/20   [provider]  Blood Glucose Monitoring Suppl (ACCU-CHEK GUIDE ME) w/Device KIT 4 (four) times daily. 07/02/20   [provider]  Cetirizine HCl 10 MG CAPS cetirizine 10 mg capsule  Take by oral route.    [provider]  Cholecalciferol (VITAMIN D) 2000 units tablet Take 4,000 Units by mouth daily.     [provider]  cimetidine (TAGAMET) 200 MG tablet Take 0.5 tablets (100 mg total) by mouth daily as needed. Patient taking differently: Take 100  mg by mouth daily as needed (indigestion). 03/22/20   Barton Dubois, MD  CINNAMON PO Take 2 capsules by mouth 3 (three) times daily.    [provider]  clobetasol ointment (TEMOVATE) 3.15 % Apply 1 application topically 3 (three) times daily as needed (imflammation). 06/20/15   [provider]  diphenhydrAMINE (BENADRYL) 25 mg capsule 2 capsules every 4 (four) hours    [provider]  DULoxetine (CYMBALTA) 30 MG capsule Take 1 capsule (30 mg total) by mouth 2 (two) times daily. 09/16/17   Hall, Lorenda Cahill, DO  EASY COMFORT PEN NEEDLES 31G X 5 MM MISC INJECT IN SULIN EVERY DAY AS DIRECTED 06/02/21   [provider]  empagliflozin (JARDIANCE) 10 MG TABS tablet Take 20 mg by mouth daily.    [provider]  fluticasone (VERAMYST) 27.5 MCG/SPRAY  nasal spray Place 2 sprays into the nose daily.    [provider]  guaiFENesin (ROBITUSSIN) 100 MG/5ML SOLN Take 5 mLs (100 mg total) by mouth every 4 (four) hours as needed for cough or to loosen phlegm. 03/17/20   Manuella Ghazi, Pratik D, DO  HUMALOG KWIKPEN 100 UNIT/ML KiwkPen Inject 8-14 Units into the skin See admin instructions. 09/30/17   [provider]  hydrOXYzine (ATARAX/VISTARIL) 25 MG tablet Take 25 mg by mouth every 8 (eight) hours as needed for itching.  06/20/15   [provider]  ipratropium-albuterol (DUONEB) 0.5-2.5 (3) MG/3ML SOLN Take 3 mLs by nebulization every 6 (six) hours as needed (shortness of breath). 05/03/21   [provider]  JARDIANCE 10 MG TABS tablet Take 20 mg by mouth every morning. Patient not taking: Reported on 08/25/2021 04/22/21   [provider]  Lancet Devices (EASY MINI EJECT LANCING DEVICE) MISC 4 (four) times daily. 07/02/20   [provider]  levothyroxine (SYNTHROID) 88 MCG tablet Take 88 mcg by mouth daily before breakfast.    [provider]  lubiprostone (AMITIZA) 8 MCG capsule TAKE 1 CAPSULE(8 MCG) BY MOUTH TWICE DAILY WITH A MEAL 06/02/21   Annitta Needs, NP  LYRICA 75 MG capsule Take 75 mg by mouth 2 (two) times daily.  07/18/15   [provider]  methocarbamol (ROBAXIN) 500 MG tablet Take 1 tablet (500 mg total) by mouth 3 (three) times daily as needed. Patient taking differently: Take 500 mg by mouth 3 (three) times daily as needed for muscle spasms. 08/06/17   Netta Cedars, MD  montelukast (SINGULAIR) 10 MG tablet Take 1 tablet by mouth every evening. 04/02/21   [provider]  omega-3 acid ethyl esters (LOVAZA) 1 g capsule Take 2 capsules (2 g total) by mouth 2 (two) times daily. 05/21/21 06/24/58  Arrien, Jimmy Picket, MD  pantoprazole (PROTONIX) 40 MG tablet Take 1 tablet (40 mg total) by mouth daily. 10/04/20   Mahala Menghini, PA-C  potassium chloride (KLOR-CON) 10 MEQ tablet Take  10 mEq by mouth daily.    [provider]  RESTASIS 0.05 % ophthalmic emulsion Place 1 drop into both eyes 2 (two) times daily. 08/21/20   [provider]  torsemide (DEMADEX) 20 MG tablet Take 2 tablets (40 mg total) by mouth daily. 07/02/15   Samuella Cota, MD  traMADol (ULTRAM) 50 MG tablet Take 50 mg by mouth 3 (three) times daily as needed for moderate pain. 01/07/18   [provider]  traZODone (DESYREL) 50 MG tablet Take 50-100 mg by mouth at bedtime as needed for sleep.    [provider]  vitamin B-12 (CYANOCOBALAMIN) 1000 MCG tablet Take 1,000 mcg by mouth daily.    [provider]  XULTOPHY 100-3.6 UNIT-MG/ML SOPN Inject 85 Units as directed at bedtime. Patient taking differently: Inject 40 Units as directed at bedtime. 03/22/20   Barton Dubois, MD    Physical Exam    Vital Signs:  Stephanie [name] does not have vital signs available for review today.  Given telephonic nature of communication, physical exam is limited. AAOx3. NAD. Normal affect.  Speech and respirations are unlabored.  Accessory Clinical Findings    None  Assessment & Plan    1.  Preoperative Cardiovascular Risk Assessment: Stable from a cardiac perspective and able to proceed without further cardiac testing. She is at moderate risk for MACE according to the Revised Cardiac Risk Index (RCRI), her Perioperative Risk of Major Cardiac Event is (%): 6.6. Her Functional Capacity in METs is: 4.4 according to the [employer] Activity Status Index (DASI).  Reviewed request to hold Eliquis with our clinical pharmacist. Patient will need to seek clearance from PCP due to anticoagulation for VTE, unclear whether isolated or chronic DVT therapy.   A copy of this note will be routed to requesting surgeon.  Time:   Today, I have spent 10 minutes with the patient with telehealth technology discussing medical history, symptoms, and management plan.    Emmaline Life, NP-C     08/26/2021, 8:53 AM [place] [address], [address] Office [phone] Fax [phone]

## 2021-08-26 NOTE — Telephone Encounter (Signed)
Notified [facility] [employer] [employer] of message from our pharmacist. I am routing cardiac clearance from today's telephone visit in a separate encounter.

## 2021-08-28 ENCOUNTER — Other Ambulatory Visit (HOSPITAL_COMMUNITY): Payer: Self-pay | Admitting: Physician Assistant

## 2021-08-28 DIAGNOSIS — Z01818 Encounter for other preprocedural examination: Secondary | ICD-10-CM

## 2021-08-29 ENCOUNTER — Other Ambulatory Visit (HOSPITAL_COMMUNITY): Payer: Self-pay | Admitting: Nephrology

## 2021-08-29 ENCOUNTER — Encounter (HOSPITAL_COMMUNITY): Payer: Self-pay

## 2021-08-29 ENCOUNTER — Ambulatory Visit (HOSPITAL_COMMUNITY)
Admission: RE | Admit: 2021-08-29 | Discharge: 2021-08-29 | Disposition: A | Discharge status: Home / Self Care | Payer: Medicare Other | Source: Ambulatory Visit | Attending: Nephrology | Admitting: Nephrology

## 2021-08-29 DIAGNOSIS — I129 Hypertensive chronic kidney disease with stage 1 through stage 4 chronic kidney disease, or unspecified chronic kidney disease: Secondary | ICD-10-CM | POA: Insufficient documentation

## 2021-08-29 DIAGNOSIS — R809 Proteinuria, unspecified: Secondary | ICD-10-CM | POA: Diagnosis not present

## 2021-08-29 DIAGNOSIS — E1122 Type 2 diabetes mellitus with diabetic chronic kidney disease: Secondary | ICD-10-CM

## 2021-08-29 DIAGNOSIS — D472 Monoclonal gammopathy: Secondary | ICD-10-CM

## 2021-08-29 DIAGNOSIS — R600 Localized edema: Secondary | ICD-10-CM | POA: Diagnosis not present

## 2021-08-29 DIAGNOSIS — Z01818 Encounter for other preprocedural examination: Secondary | ICD-10-CM

## 2021-08-29 DIAGNOSIS — N189 Chronic kidney disease, unspecified: Secondary | ICD-10-CM | POA: Diagnosis not present

## 2021-08-29 LAB — CBC
HCT: 43.3 % (ref 36.0–46.0)
Hemoglobin: 13.6 g/dL (ref 12.0–15.0)
MCH: 26 pg (ref 26.0–34.0)
MCHC: 31.4 g/dL (ref 30.0–36.0)
MCV: 82.6 fL (ref 80.0–100.0)
Platelets: 250 10*3/uL (ref 150–400)
RBC: 5.24 MIL/uL — ABNORMAL HIGH (ref 3.87–5.11)
RDW: 13.9 % (ref 11.5–15.5)
WBC: 12.8 10*3/uL — ABNORMAL HIGH (ref 4.0–10.5)
nRBC: 0 % (ref 0.0–0.2)

## 2021-08-29 LAB — GLUCOSE, CAPILLARY
Glucose-Capillary: 157 mg/dL — ABNORMAL HIGH (ref 70–99)
Glucose-Capillary: 160 mg/dL — ABNORMAL HIGH (ref 70–99)

## 2021-08-29 LAB — PROTIME-INR
INR: 1 (ref 0.8–1.2)
Prothrombin Time: 12.9 seconds (ref 11.4–15.2)

## 2021-08-29 MED ORDER — SODIUM CHLORIDE 0.9 % IV SOLN: INTRAVENOUS | Status: DC

## 2021-08-29 MED ORDER — FENTANYL CITRATE (PF) 100 MCG/2ML IJ SOLN
INTRAMUSCULAR | Status: AC
  Filled 2021-08-29: qty 2

## 2021-08-29 MED ORDER — HYDRALAZINE HCL 20 MG/ML IJ SOLN
INTRAMUSCULAR | Status: AC
  Filled 2021-08-29: qty 1

## 2021-08-29 MED ORDER — HYDRALAZINE HCL 20 MG/ML IJ SOLN
INTRAMUSCULAR | Status: AC | PRN
  Administered 2021-08-29: 10 mg via INTRAVENOUS

## 2021-08-29 MED ORDER — MIDAZOLAM HCL 2 MG/2ML IJ SOLN
INTRAMUSCULAR | Status: AC
  Filled 2021-08-29: qty 2

## 2021-08-29 MED ORDER — LIDOCAINE HCL 1 % IJ SOLN
INTRAMUSCULAR | Status: AC
  Filled 2021-08-29: qty 10

## 2021-08-29 MED ORDER — FENTANYL CITRATE (PF) 100 MCG/2ML IJ SOLN
INTRAMUSCULAR | Status: AC | PRN
  Administered 2021-08-29: 50 ug via INTRAVENOUS

## 2021-08-29 MED ORDER — MIDAZOLAM HCL 2 MG/2ML IJ SOLN
INTRAMUSCULAR | Status: AC | PRN
  Administered 2021-08-29: 1 mg via INTRAVENOUS

## 2021-08-29 NOTE — H&P (Signed)
Chief Complaint: Patient was seen in consultation today for CKD at the request of [place] [facility]  Referring Physician(s): Bhutani,Manpreet S  Supervising Physician: Michaelle Birks  Patient Status: [facility] - Out-pt  History of Present Illness: Stephanie [name] is a 71 y.o. female with history of CKD and HTN who presents with proteinuria as high as 9g.   She struggles with edema of the legs which is what prompted her to see nephrology.  She also has elevated free light chains and positive ANA.  She is referred to IR for renal biopsy.  She has stopped her Eliquis.  Pt denies H/A, blurred vision, dizziness, CP, palpitations, SOB, abd pain, N/V, or changes in bowel or urinary habits.  She does note intermittent swelling of her lower legs.   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   Type 2 diabetes mellitus ([place])     Past Surgical History:  Procedure Laterality Date   ABDOMINAL HYSTERECTOMY  partial   CARPAL TUNNEL RELEASE Right 1991   CATARACT EXTRACTION W/PHACO Right 05/08/2013   Procedure: CATARACT EXTRACTION PHACO AND INTRAOCULAR LENS PLACEMENT ([place]);  Surgeon: Tonny Branch, MD;  Location: [facility] ORS;  Service: Ophthalmology;  Laterality: Right;  CDE 10.31   CATARACT EXTRACTION W/PHACO Left 08/17/2013   Procedure: CATARACT EXTRACTION PHACO AND INTRAOCULAR LENS PLACEMENT ([facility]);  Surgeon: Tonny Branch, MD;  Location: [facility] OR[facility];  Service: Ophthalmology;  Laterality: Left;  CDE:9.03   CHOLECYSTECTOMY  1971   COLONOSCOPY WITH PROPOFOL N/A  01/06/2016   Dr. Gala Romney: diverticulosis    DENTAL SURGERY     ESOPHAGEAL BRUSHING  08/29/2019   Procedure: ESOPHAGEAL BRUSHING;  Surgeon: Daneil Dolin, MD;  Location: [facility] END[facility] SUITE;  Service: Endoscopy;;   ESOPHAGOGASTRODUODENOSCOPY (EGD) WITH PROPOFOL N/A 01/06/2016   Dr. Gala Romney: normal s/p empiric dilation    ESOPHAGOGASTRODUODENOSCOPY (EGD) WITH PROPOFOL N/A 08/29/2019   esophageal plaques vs medication residue adherent to tubular esophagus s/p KOH brushing and dilation. Medium-sized hiatal hernia. + for candida. Diflucan.    EYE SURGERY     MALONEY DILATION N/A 01/06/2016   Procedure: Venia Minks DILATION;  Surgeon: Daneil Dolin, MD;  Location: [facility];  Service: Endoscopy;  Laterality: N/A;   MALONEY DILATION N/A 08/29/2019   Procedure: Venia Minks DILATION;  Surgeon: Daneil Dolin, MD;  Location: [facility];  Service: Endoscopy;  Laterality: N/A;   REVERSE SHOULDER ARTHROPLASTY Right 08/06/2017   Procedure: RIGHT REVERSE SHOULDER ARTHROPLASTY;  Surgeon: Netta Cedars, MD;  Location: [place];  Service: Orthopedics;  Laterality: Right;   WRIST GANGLION EXCISION Left     Allergies: Tetracyclines & related, Banana, and Penicillins  Medications: Prior to Admission medications   Medication Sig Start Date End Date Taking? Authorizing Provider  albuterol (PROVENTIL) (2.5 MG/3ML) 0.083% nebulizer solution Take 2.5 mg by nebulization every 6 (six) hours as needed for wheezing or shortness of breath.   Yes [provider]  albuterol (VENTOLIN HFA) 108 (90 Base) MCG/ACT  inhaler Inhale 1-2 puffs into the lungs every 6 (six) hours as needed for wheezing or shortness of breath. 03/21/21  Yes Volney American, PA-C  apixaban (ELIQUIS) 5 MG TABS tablet Take 1 tablet (5 mg total) by mouth 2 (two) times daily. 05/21/21  Yes Arrien, Jimmy Picket, MD  ARNUITY ELLIPTA 100 MCG/ACT AEPB Inhale 1 puff into the lungs at bedtime.  06/13/17  Yes [provider]  Ascorbic Acid (VITAMIN C)  1000 MG tablet Take 500 mg by mouth daily.   Yes [provider]  atorvastatin (LIPITOR) 80 MG tablet Take 1 tablet (80 mg total) by mouth daily. 05/22/21 06/24/58 Yes Arrien, Jimmy Picket, MD  bisoprolol (ZEBETA) 10 MG tablet Take 1 tablet (10 mg total) by mouth in the morning and at bedtime. 01/02/21  Yes Strader, [place] M, PA-C  Cetirizine HCl 10 MG CAPS Take 10 mg by mouth daily.   Yes [provider]  Cholecalciferol (VITAMIN D) 2000 units tablet Take 4,000 Units by mouth daily.    Yes [provider]  cimetidine (TAGAMET) 200 MG tablet Take 0.5 tablets (100 mg total) by mouth daily as needed. Patient taking differently: Take 100 mg by mouth daily as needed (indigestion). 03/22/20  Yes Barton Dubois, MD  CINNAMON PO Take 1-2 capsules by mouth 3 (three) times daily.   Yes [provider]  clobetasol ointment (TEMOVATE) 0.07 % Apply 1 application topically 3 (three) times daily as needed (imflammation). 06/20/15  Yes [provider]  DULoxetine (CYMBALTA) 30 MG capsule Take 1 capsule (30 mg total) by mouth 2 (two) times daily. 09/16/17  Yes Hall, Carole N, DO  empagliflozin (JARDIANCE) 10 MG TABS tablet Take 20 mg by mouth daily.   Yes [provider]  fluticasone (VERAMYST) 27.5 MCG/SPRAY nasal spray Place 2 sprays into the nose daily.   Yes [provider]  HUMALOG KWIKPEN 100 UNIT/ML KiwkPen Inject 8-16 Units into the skin 3 (three) times daily. Sliding Scale 09/30/17  Yes [provider]  hydrOXYzine (ATARAX/VISTARIL) 25 MG tablet Take 25 mg by mouth every 8 (eight) hours as needed for itching.  06/20/15  Yes [provider]  levothyroxine (SYNTHROID) 88 MCG tablet Take 88 mcg by mouth daily before breakfast.   Yes [provider]  lubiprostone (AMITIZA) 8 MCG capsule TAKE 1 CAPSULE(8 MCG) BY MOUTH TWICE DAILY WITH A MEAL Patient taking differently: Take 8 mcg by mouth daily as needed (IBS). With meals 06/02/21   Yes Annitta Needs, NP  LYRICA 75 MG capsule Take 75 mg by mouth 2 (two) times daily.  07/18/15  Yes [provider]  methocarbamol (ROBAXIN) 500 MG tablet Take 1 tablet (500 mg total) by mouth 3 (three) times daily as needed. Patient taking differently: Take 500 mg by mouth 3 (three) times daily as needed for muscle spasms. 08/06/17  Yes Netta Cedars, MD  omega-3 acid ethyl esters (LOVAZA) 1 g capsule Take 2 capsules (2 g total) by mouth 2 (two) times daily. 05/21/21 06/24/58 Yes Arrien, Jimmy Picket, MD  pantoprazole (PROTONIX) 40 MG tablet Take 1 tablet (40 mg total) by mouth daily. 10/04/20  Yes Mahala Menghini, PA-C  potassium chloride (KLOR-CON) 10 MEQ tablet Take 10 mEq by mouth daily.   Yes [provider]  RESTASIS 0.05 % ophthalmic emulsion Place 1 drop into both eyes 2 (two) times daily. 08/21/20  Yes [provider]  torsemide (DEMADEX) 20 MG tablet Take 2 tablets (40 mg total) by mouth daily.  07/02/15  Yes Samuella Cota, MD  traMADol (ULTRAM) 50 MG tablet Take 50 mg by mouth 3 (three) times daily as needed for moderate pain. 01/07/18  Yes [provider]  traZODone (DESYREL) 50 MG tablet Take 50-100 mg by mouth at bedtime as needed for sleep.   Yes [provider]  vitamin B-12 (CYANOCOBALAMIN) 1000 MCG tablet Take 1,000 mcg by mouth daily.   Yes [provider]  XULTOPHY 100-3.6 UNIT-MG/ML SOPN Inject 85 Units as directed at bedtime. Patient taking differently: Inject 40 Units as directed every morning. 03/22/20  Yes Barton Dubois, MD  Accu-Chek FastClix Lancets MISC Apply topically. 03/15/20   [provider]  ACCU-CHEK GUIDE test strip 4 (four) times daily. 07/02/20   [provider]  acetaminophen (TYLENOL) 500 MG tablet Take 1,000 mg by mouth every 6 (six) hours as needed for moderate pain or headache.    [provider]  Alcohol Swabs (ALCOHOL PADS) 70 % PADS SMARTSIG:Pledget(s) Topical 4 Times Daily  08/12/20   [provider]  Blood Glucose Calibration (ACCU-CHEK GUIDE CONTROL) LIQD See admin instructions. 07/02/20   [provider]  Blood Glucose Monitoring Suppl (ACCU-CHEK GUIDE ME) w/Device KIT 4 (four) times daily. 07/02/20   [provider]  diphenhydrAMINE (BENADRYL) 25 mg capsule Take 50 mg by mouth every 4 (four) hours as needed for itching.    [provider]  EASY COMFORT PEN NEEDLES 31G X 5 MM MISC INJECT IN SULIN EVERY DAY AS DIRECTED 06/02/21   [provider]  guaiFENesin (ROBITUSSIN) 100 MG/5ML SOLN Take 5 mLs (100 mg total) by mouth every 4 (four) hours as needed for cough or to loosen phlegm. 03/17/20   Manuella Ghazi, Pratik D, DO  ipratropium-albuterol (DUONEB) 0.5-2.5 (3) MG/3ML SOLN Take 3 mLs by nebulization every 6 (six) hours as needed (shortness of breath). 05/03/21   [provider]  Lancet Devices (EASY MINI EJECT LANCING DEVICE) MISC 4 (four) times daily. 07/02/20   [provider]  montelukast (SINGULAIR) 10 MG tablet Take 10 mg by mouth at bedtime. 04/02/21   [provider]     Family History  Problem Relation Age of Onset   Hypertension Mother    Diabetes Mother    COPD Mother    Arthritis Mother    Diabetes Father    Arthritis Father    Dementia Father    CAD Father    Hypothyroidism Sister    Diabetes Brother    Colon cancer Niece    Colon polyps Neg Hx     Social History   Socioeconomic History   Marital status: Married    Spouse name: Not on file   Number of children: Not on file   Years of education: Not on file   Highest education level: Not on file  Occupational History   Not on file  Tobacco Use   Smoking status: Former    Packs/day: 0.25    Years: 30.00    Total pack years: 7.50    Types: Cigarettes    Quit date: 02/17/2011    Years since quitting: 10.5   Smokeless tobacco: Never  Vaping Use   Vaping Use: Never used  Substance and Sexual Activity   Alcohol use: Not  Currently    Comment: rare   Drug use: No   Sexual activity: Not Currently    Birth control/protection: Surgical  Other Topics Concern   Not on file  Social History Narrative   Not on file  Social Determinants of Health   Financial Resource Strain: Not on file  Food Insecurity: Not on file  Transportation Needs: Not on file  Physical Activity: Not on file  Stress: Not on file  Social Connections: Not on file    Review of Systems: A 12 point ROS discussed and pertinent positives are indicated in the HPI above.  All other systems are negative.  Vital Signs: BP 108/74   Pulse 76   Temp 97.9 F (36.6 C) (Oral)   Resp 17   Ht 5' 8"  (1.727 m)   Wt 225 lb (102.1 kg)   SpO2 100%   BMI 34.21 kg/m   Physical Exam Vitals reviewed.  Constitutional:      General: She is not in acute distress.    Appearance: She is obese. She is not ill-appearing.  HENT:     Head: Normocephalic and atraumatic.     Mouth/Throat:     Mouth: Mucous membranes are moist.     Pharynx: Oropharynx is clear.  Eyes:     Extraocular Movements: Extraocular movements intact.  Cardiovascular:     Rate and Rhythm: Normal rate and regular rhythm.     Pulses: Normal pulses.     Heart sounds: Normal heart sounds.     Comments: Trace BLE edema Pulmonary:     Effort: Pulmonary effort is normal. No respiratory distress.     Breath sounds: Normal breath sounds.  Abdominal:     General: Bowel sounds are normal.     Palpations: Abdomen is soft.  Skin:    General: Skin is dry.  Neurological:     General: No focal deficit present.     Mental Status: She is alert and oriented to person, place, and time.  Psychiatric:        Mood and Affect: Mood normal.        Behavior: Behavior normal.     Imaging: No results found.  Labs:  CBC: Recent Labs    05/17/21 0023 05/17/21 0629 05/18/21 0956 08/29/21 0640  WBC 10.4 11.4* 9.2 12.8*  HGB 12.4 12.6 13.0 13.6  HCT 42.1 40.2 40.9 43.3  PLT 237 234 244  250    COAGS: Recent Labs    10/15/20 1135 05/17/21 0023 08/29/21 0640  INR 1.0 1.1 1.0  APTT  --  32  --     BMP: Recent Labs    05/18/21 0956 05/19/21 0851 05/20/21 0642 07/24/21 1341  NA 138 140 141 137  K 3.4* 3.7 3.7 5.0  CL 105 105 103 106  CO2 26 28 31 23   GLUCOSE 64* 83 100* 225*  BUN 13 13 14 22   CALCIUM 8.9 9.1 8.7* 9.1  CREATININE 1.12* 1.05* 1.07* 1.18*  GFRNONAA 53* 57* 56* 50*    LIVER FUNCTION TESTS: Recent Labs    05/12/21 1327 05/17/21 0023 05/17/21 0629 07/24/21 1341  BILITOT 0.6 0.7 0.8 0.7  AST 37 30 27 42*  ALT 36 35 33 57*  ALKPHOS 373* 351* 350* 384*  PROT 7.4 7.7 7.6 8.3*  ALBUMIN 2.5* 2.6* 2.6* 2.9*    Assessment and Plan:  CKD --now with proteinuria --presents for image guided renal biopsy with planned d/c later this afternoon.  Risks and benefits of renal biopsy was discussed with the patient and/or patient's family including, but not limited to bleeding, infection, damage to adjacent structures or low yield requiring additional tests.  All of the questions were answered and there is agreement to proceed.  Consent signed  and in chart.   Thank you for this interesting consult.  I greatly enjoyed meeting Stephanie [name] and look forward to participating in their care.  A copy of this report was sent to the requesting provider on this date.  Electronically Signed: Pasty Spillers, PA 08/29/2021, 8:04 AM   I spent a total of 20 minutes in face to face in clinical consultation, greater than 50% of which was counseling/coordinating care for CKD.

## 2021-08-29 NOTE — Procedures (Signed)
Interventional Radiology Procedure Note  Procedure: CT guided biopsy left kidney, medical renal Complications: None EBL: None Recommendations: - Bedrest 2 hours.   - Routine wound care - Follow up pathology - Advance diet   Signed,  Corrie Mckusick, DO

## 2021-09-02 ENCOUNTER — Encounter (HOSPITAL_COMMUNITY): Payer: Self-pay

## 2021-09-03 LAB — SURGICAL PATHOLOGY

## 2021-09-09 ENCOUNTER — Encounter: Payer: Self-pay | Admitting: Neurology

## 2021-09-09 ENCOUNTER — Ambulatory Visit (INDEPENDENT_AMBULATORY_CARE_PROVIDER_SITE_OTHER): Payer: Medicare Other | Admitting: Neurology

## 2021-09-09 VITALS — BP 156/82 | HR 79

## 2021-09-09 DIAGNOSIS — E1169 Type 2 diabetes mellitus with other specified complication: Secondary | ICD-10-CM | POA: Diagnosis not present

## 2021-09-09 DIAGNOSIS — E11649 Type 2 diabetes mellitus with hypoglycemia without coma: Secondary | ICD-10-CM | POA: Diagnosis not present

## 2021-09-09 DIAGNOSIS — G4733 Obstructive sleep apnea (adult) (pediatric): Secondary | ICD-10-CM | POA: Diagnosis not present

## 2021-09-09 DIAGNOSIS — E785 Hyperlipidemia, unspecified: Secondary | ICD-10-CM

## 2021-09-09 DIAGNOSIS — I639 Cerebral infarction, unspecified: Secondary | ICD-10-CM

## 2021-09-09 LAB — LIPID PANEL
Cholesterol: 210 — AB (ref 0–200)
HDL: 63 (ref 35–70)
LDL Cholesterol: 125
Triglycerides: 122 (ref 40–160)

## 2021-09-09 LAB — HEMOGLOBIN A1C: Hemoglobin A1C: 8.8

## 2021-09-09 NOTE — Patient Instructions (Signed)
I had a long d/w patient about her recent cryptogenic stroke, sleep apnea, risk for recurrent stroke/TIAs, personally independently reviewed imaging studies and stroke evaluation results and answered questions.Continue Eliquis (apixaban) daily  for secondary stroke prevention given her history of DVT and maintain strict control of hypertension with blood pressure goal below 130/90, diabetes with hemoglobin A1c goal below 6.5% and lipids with LDL cholesterol goal below 70 mg/dL. I also advised the patient to eat a healthy diet with plenty of whole grains, cereals, fruits and vegetables, exercise regularly and maintain ideal body weight.  Check follow-up lipid profile and hemoglobin A1c and if LDL is suboptimal may need to consider adding Praluent or Repatha injections.  Referred to sleep clinic for further evaluation and treatment for his sleep apnea.  Continue participation in the sleep smart study and she has been randomized to the medical treatment.  Followup in the future with me in 6 months or call earlier if necessary. Stroke Prevention Some medical conditions and behaviors can lead to a higher chance of having a stroke. You can help prevent a stroke by eating healthy, exercising, not smoking, and managing any medical conditions you have. Stroke is a leading cause of functional impairment. Primary prevention is particularly important because a majority of strokes are first-time events. Stroke changes the lives of not only those who experience a stroke but also their family and other caregivers. How can this condition affect me? A stroke is a medical emergency and should be treated right away. A stroke can lead to brain damage and can sometimes be life-threatening. If a person gets medical treatment right away, there is a better chance of surviving and recovering from a stroke. What can increase my risk? The following medical conditions may increase your risk of a stroke: Cardiovascular disease. High  blood pressure (hypertension). Diabetes. High cholesterol. Sickle cell disease. Blood clotting disorders (hypercoagulable state). Obesity. Sleep disorders (obstructive sleep apnea). Other risk factors include: Being older than age 66. Having a history of blood clots, stroke, or mini-stroke (transient ischemic attack, TIA). Genetic factors, such as race, ethnicity, or a family history of stroke. Smoking cigarettes or using other tobacco products. Taking birth control pills, especially if you also use tobacco. Heavy use of alcohol or drugs, especially cocaine and methamphetamine. Physical inactivity. What actions can I take to prevent this? Manage your health conditions High cholesterol levels. Eating a healthy diet is important for preventing high cholesterol. If cholesterol cannot be managed through diet alone, you may need to take medicines. Take any prescribed medicines to control your cholesterol as told by your health care provider. Hypertension. To reduce your risk of stroke, try to keep your blood pressure below 130/80. Eating a healthy diet and exercising regularly are important for controlling blood pressure. If these steps are not enough to manage your blood pressure, you may need to take medicines. Take any prescribed medicines to control hypertension as told by your health care provider. Ask your health care provider if you should monitor your blood pressure at home. Have your blood pressure checked every year, even if your blood pressure is normal. Blood pressure increases with age and some medical conditions. Diabetes. Eating a healthy diet and exercising regularly are important parts of managing your blood sugar (glucose). If your blood sugar cannot be managed through diet and exercise, you may need to take medicines. Take any prescribed medicines to control your diabetes as told by your health care provider. Get evaluated for obstructive sleep apnea. Talk to  your health  care provider about getting a sleep evaluation if you snore a lot or have excessive sleepiness. Make sure that any other medical conditions you have, such as atrial fibrillation or atherosclerosis, are managed. Nutrition Follow instructions from your health care provider about what to eat or drink to help manage your health condition. These instructions may include: Reducing your daily calorie intake. Limiting how much salt (sodium) you use to 1,500 milligrams (mg) each day. Using only healthy fats for cooking, such as olive oil, canola oil, or sunflower oil. Eating healthy foods. You can do this by: Choosing foods that are high in fiber, such as whole grains, and fresh fruits and vegetables. Eating at least 5 servings of fruits and vegetables a day. Try to fill one-half of your plate with fruits and vegetables at each meal. Choosing lean protein foods, such as lean cuts of meat, poultry without skin, fish, tofu, beans, and nuts. Eating low-fat dairy products. Avoiding foods that are high in sodium. This can help lower blood pressure. Avoiding foods that have saturated fat, trans fat, and cholesterol. This can help prevent high cholesterol. Avoiding processed and prepared foods. Counting your daily carbohydrate intake.  Lifestyle If you drink alcohol: Limit how much you have to: 0-1 drink a day for women who are not pregnant. 0-2 drinks a day for men. Know how much alcohol is in your drink. In the [place] one drink equals one 12 oz bottle of beer (342m), one 5 oz glass of wine (1447m, or one 1 oz glass of hard liquor (4440m Do not use any products that contain nicotine or tobacco. These products include cigarettes, chewing tobacco, and vaping devices, such as e-cigarettes. If you need help quitting, ask your health care provider. Avoid secondhand smoke. Do not use drugs. Activity  Try to stay at a healthy weight. Get at least 30 minutes of exercise on most days, such as: Fast  walking. Biking. Swimming. Medicines Take over-the-counter and prescription medicines only as told by your health care provider. Aspirin or blood thinners (antiplatelets or anticoagulants) may be recommended to reduce your risk of forming blood clots that can lead to stroke. Avoid taking birth control pills. Talk to your health care provider about the risks of taking birth control pills if: You are over 35 76ars old. You smoke. You get very bad headaches. You have had a blood clot. Where to find more information American Stroke Association: www.strokeassociation.org Get help right away if: You or a loved one has any symptoms of a stroke. "BE FAST" is an easy way to remember the main warning signs of a stroke: B - Balance. Signs are dizziness, sudden trouble walking, or loss of balance. E - Eyes. Signs are trouble seeing or a sudden change in vision. F - Face. Signs are sudden weakness or numbness of the face, or the face or eyelid drooping on one side. A - Arms. Signs are weakness or numbness in an arm. This happens suddenly and usually on one side of the body. S - Speech. Signs are sudden trouble speaking, slurred speech, or trouble understanding what people say. T - Time. Time to call emergency services. Write down what time symptoms started. You or a loved one has other signs of a stroke, such as: A sudden, severe headache with no known cause. Nausea or vomiting. Seizure. These symptoms may represent a serious problem that is an emergency. Do not wait to see if the symptoms will go away. Get medical help right away.  Call your local emergency services (911 in the [place] Do not drive yourself to the hospital. Summary You can help to prevent a stroke by eating healthy, exercising, not smoking, limiting alcohol intake, and managing any medical conditions you may have. Do not use any products that contain nicotine or tobacco. These include cigarettes, chewing tobacco, and vaping devices,  such as e-cigarettes. If you need help quitting, ask your health care provider. Remember "BE FAST" for warning signs of a stroke. Get help right away if you or a loved one has any of these signs. This information is not intended to replace advice given to you by your health care provider. Make sure you discuss any questions you have with your health care provider. Document Revised: 09/04/2019 Document Reviewed: 09/04/2019 Elsevier Patient Education  [place].

## 2021-09-09 NOTE — Progress Notes (Signed)
[facility] [address] [place]. [place] [address] [phone]       OFFICE FOLLOW-UP NOTE  [name] Date of Birth:  1950/05/19 Medical Record Number:  [id number]   HPI: [name] is a 71 year old pleasant African-American lady seen today for initial office follow-up visit following consultation for stroke in April 2023.  History is obtained from the patient and review of electronic medical records and I have personally reviewed pertinent available imaging films in PACS.  She has past medical history of diabetes, hypertension, hyperlipidemia, obesity, gastroesophageal reflux disease, fibromyalgia, deep vein thrombosis on Eliquis, COPD, asthma and arthritis.  She presented on 05/17/2021 with sudden onset of left hand weakness.  She was seen by telestroke neurologist.  She was not a candidate for thrombolysis due to mild and resolving symptoms and presenting late.  MRI scan showed an acute right frontal precentral gyrus and subcortical white matter infarct as well as an additional tiny right insular subacute infarct felt to be of embolic cryptogenic etiology.  MR angiogram of the brain showed no large vessel stenosis or occlusion and carotid ultrasound showed no significant extracranial stenosis.  Transcranial Doppler bubble study was negative for PFO.  2D echo showed ejection fraction of 45 to 50% with no cardiac source of embolism.  LDL cholesterol was elevated at 166 mg percent and hemoglobin A1c at 8.3.  Telemetry monitoring did not reveal A-fib and subsequently outpatient 30-day heart monitor showed no evidence of paroxysmal A-fib either.  Patient was continued on Eliquis due to her history of deep vein thrombosis which is tolerating well without bruising or bleeding.  She has noticed improvement in the left hand weakness that she has only occasional diminished fine motor skills and has trouble grasping objects occasionally and may drop it.  She states her sugars are doing much  better fastings range between 70s to occasionally 180s.  Blood pressure has been fluctuating and today it is elevated at 159/82.  She is tolerating Lipitor well without side effects but has not had any follow-up lipid profile checked.  She is ambulating independently but needs to use a cane due to her chronic back pain.  She has had no recent falls or injuries.  The patient participated in the sleep smart stroke prevention study and tested positive on the NOx 3 monitor for sleep apnea.  She was randomized to the medical treatment arm.  She has expressed interest to be referred to the sleep physician for consideration of using CPAP through her insurance.  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   Type 2 diabetes mellitus ([place])     Social History:  Social History   Socioeconomic History   Marital status: Married    Spouse name: Not on file   Number of children: Not on file  Years of education: Not on file   Highest education level: Not on file  Occupational History   Not on file  Tobacco Use   Smoking status: Former    Packs/day: 0.25    Years: 30.00    Total pack years: 7.50    Types: Cigarettes    Quit date: 02/17/2011    Years since quitting: 10.5   Smokeless tobacco: Never  Vaping Use   Vaping Use: Never used  Substance and Sexual Activity   Alcohol use: Not Currently     Comment: rare   Drug use: No   Sexual activity: Not Currently    Birth control/protection: Surgical  Other Topics Concern   Not on file  Social History Narrative   Not on file   Social Determinants of Health   Financial Resource Strain: Not on file  Food Insecurity: Not on file  Transportation Needs: Not on file  Physical Activity: Not on file  Stress: Not on file  Social Connections: Not on file  Intimate Partner Violence: Not on file    Medications:   Current Outpatient Medications on File Prior to Visit  Medication Sig Dispense Refill   Accu-Chek FastClix Lancets MISC Apply topically.     ACCU-CHEK GUIDE test strip 4 (four) times daily.     acetaminophen (TYLENOL) 500 MG tablet Take 1,000 mg by mouth every 6 (six) hours as needed for moderate pain or headache.     albuterol (PROVENTIL) (2.5 MG/3ML) 0.083% nebulizer solution Take 2.5 mg by nebulization every 6 (six) hours as needed for wheezing or shortness of breath.     albuterol (VENTOLIN HFA) 108 (90 Base) MCG/ACT inhaler Inhale 1-2 puffs into the lungs every 6 (six) hours as needed for wheezing or shortness of breath. 18 g 0   Alcohol Swabs (ALCOHOL PADS) 70 % PADS SMARTSIG:Pledget(s) Topical 4 Times Daily     apixaban (ELIQUIS) 5 MG TABS tablet Take 1 tablet (5 mg total) by mouth 2 (two) times daily.     ARNUITY ELLIPTA 100 MCG/ACT AEPB Inhale 1 puff into the lungs at bedtime.   11   Ascorbic Acid (VITAMIN C) 1000 MG tablet Take 500 mg by mouth daily.     atorvastatin (LIPITOR) 80 MG tablet Take 1 tablet (80 mg total) by mouth daily. 30 tablet 0   bisoprolol (ZEBETA) 10 MG tablet Take 1 tablet (10 mg total) by mouth in the morning and at bedtime. 180 tablet 3   Blood Glucose Calibration (ACCU-CHEK GUIDE CONTROL) LIQD See admin instructions.     Blood Glucose Monitoring Suppl (ACCU-CHEK GUIDE ME) w/Device KIT 4 (four) times daily.     Cetirizine HCl 10 MG CAPS Take 10 mg by mouth daily.     Cholecalciferol (VITAMIN D) 2000  units tablet Take 4,000 Units by mouth daily.      cimetidine (TAGAMET) 200 MG tablet Take 0.5 tablets (100 mg total) by mouth daily as needed. (Patient taking differently: Take 100 mg by mouth daily as needed (indigestion).)     CINNAMON PO Take 1-2 capsules by mouth 3 (three) times daily.     clobetasol ointment (TEMOVATE) 7.90 % Apply 1 application topically 3 (three) times daily as needed (imflammation).     diphenhydrAMINE (BENADRYL) 25 mg capsule Take 50 mg by mouth every 4 (four) hours as needed for itching.     DULoxetine (CYMBALTA) 30 MG capsule Take 1 capsule (30 mg total) by mouth 2 (two) times daily. 30 capsule -0   EASY  COMFORT PEN NEEDLES 31G X 5 MM MISC INJECT IN SULIN EVERY DAY AS DIRECTED     empagliflozin (JARDIANCE) 10 MG TABS tablet Take 20 mg by mouth daily.     fluticasone (VERAMYST) 27.5 MCG/SPRAY nasal spray Place 2 sprays into the nose daily.     guaiFENesin (ROBITUSSIN) 100 MG/5ML SOLN Take 5 mLs (100 mg total) by mouth every 4 (four) hours as needed for cough or to loosen phlegm. 236 mL 0   HUMALOG KWIKPEN 100 UNIT/ML KiwkPen Inject 8-16 Units into the skin 3 (three) times daily. Sliding Scale  5   hydrOXYzine (ATARAX/VISTARIL) 25 MG tablet Take 25 mg by mouth every 8 (eight) hours as needed for itching.      ipratropium-albuterol (DUONEB) 0.5-2.5 (3) MG/3ML SOLN Take 3 mLs by nebulization every 6 (six) hours as needed (shortness of breath).     Lancet Devices (EASY MINI EJECT LANCING DEVICE) MISC 4 (four) times daily.     levothyroxine (SYNTHROID) 88 MCG tablet Take 88 mcg by mouth daily before breakfast.     lubiprostone (AMITIZA) 8 MCG capsule TAKE 1 CAPSULE(8 MCG) BY MOUTH TWICE DAILY WITH A MEAL (Patient taking differently: Take 8 mcg by mouth daily as needed (IBS). With meals) 60 capsule 3   LYRICA 75 MG capsule Take 75 mg by mouth 2 (two) times daily.      methocarbamol (ROBAXIN) 500 MG tablet Take 1 tablet (500 mg total) by mouth 3 (three) times daily as needed.  (Patient taking differently: Take 500 mg by mouth 3 (three) times daily as needed for muscle spasms.) 60 tablet 1   montelukast (SINGULAIR) 10 MG tablet Take 10 mg by mouth at bedtime.     omega-3 acid ethyl esters (LOVAZA) 1 g capsule Take 2 capsules (2 g total) by mouth 2 (two) times daily. 120 capsule 0   pantoprazole (PROTONIX) 40 MG tablet Take 1 tablet (40 mg total) by mouth daily. 90 tablet 3   potassium chloride (KLOR-CON) 10 MEQ tablet Take 10 mEq by mouth daily.     RESTASIS 0.05 % ophthalmic emulsion Place 1 drop into both eyes 2 (two) times daily.     torsemide (DEMADEX) 20 MG tablet Take 2 tablets (40 mg total) by mouth daily. 60 tablet 0   traMADol (ULTRAM) 50 MG tablet Take 50 mg by mouth 3 (three) times daily as needed for moderate pain.  1   traZODone (DESYREL) 50 MG tablet Take 50-100 mg by mouth at bedtime as needed for sleep.     vitamin B-12 (CYANOCOBALAMIN) 1000 MCG tablet Take 1,000 mcg by mouth daily.     XULTOPHY 100-3.6 UNIT-MG/ML SOPN Inject 85 Units as directed at bedtime. (Patient taking differently: Inject 40 Units as directed every morning.)  0   No current facility-administered medications on file prior to visit.    Allergies:   Allergies  Allergen Reactions   Tetracyclines & Related Anaphylaxis and Rash   Banana Hives and Nausea And Vomiting   Penicillins Rash and Other (See Comments)    Has patient had a PCN reaction causing immediate rash, facial/tongue/throat swelling, SOB or lightheadedness with hypotension: No Has patient had a PCN reaction causing severe rash involving mucus membranes or skin necrosis: No Has patient had a PCN reaction that required hospitalization No Has patient had a PCN reaction occurring within the last 10 years: No If all of the above answers are "NO", then may proceed with Cephalosporin use.     Physical Exam General: Obese  middle-aged African-American lady, seated, in no evident distress Head: head normocephalic and  atraumatic.  Neck: supple with no carotid or supraclavicular bruits Cardiovascular: regular rate and rhythm, no murmurs Musculoskeletal: no deformity Skin: Bilateral nodular rash on forearms and right leg vascular:  Normal pulses all extremities Vitals:   09/09/21 1043  BP: (!) 156/82  Pulse: 79   Neurologic Exam Mental Status: Awake and fully alert. Oriented to place and time. Recent and remote memory intact. Attention span, concentration and fund of knowledge appropriate. Mood and affect appropriate.  Cranial Nerves: Fundoscopic exam reveals sharp disc margins. Pupils equal, briskly reactive to light. Extraocular movements full without nystagmus. Visual fields full to confrontation. Hearing intact. Facial sensation intact. Face, tongue, palate moves normally and symmetrically.  Motor: Normal bulk and tone. Normal strength in all tested extremity muscles.  Diminished fine finger movements on the left.  Orbits right over left upper extremity. Sensory.: intact to touch ,pinprick .position and vibratory sensation.  Coordination: Rapid alternating movements normal in all extremities. Finger-to-nose and heel-to-shin performed accurately bilaterally. Gait and Station: Arises from chair without difficulty. Stance is normal.  She uses a cane.  Gait demonstrates favoring her back due to pain..  Tandem walking deferred Reflexes: 1+ and symmetric. Toes downgoing.   NIHSS  0 Modified Rankin  1   ASSESSMENT: 71 year old African-American lady with cryptogenic right frontal MCA branch infarct and April 2023.  Done well with significant improvement.  Vascular risk factors of uncontrolled diabetes, hyperlipidemia, obesity, hypertension and sleep apnea.  She is participating in the sleep smart stroke prevention study and has been randomized to the medical treatment arm.     PLAN: I had a long d/w patient about her recent cryptogenic stroke, sleep apnea, risk for recurrent stroke/TIAs, personally  independently reviewed imaging studies and stroke evaluation results and answered questions.Continue Eliquis (apixaban) daily  for secondary stroke prevention given her history of DVT and maintain strict control of hypertension with blood pressure goal below 130/90, diabetes with hemoglobin A1c goal below 6.5% and lipids with LDL cholesterol goal below 70 mg/dL. I also advised the patient to eat a healthy diet with plenty of whole grains, cereals, fruits and vegetables, exercise regularly and maintain ideal body weight.  Check follow-up lipid profile and hemoglobin A1c and if LDL is suboptimal may need to consider adding Praluent or Repatha injections.  Referred to sleep clinic for further evaluation and treatment for his sleep apnea.  Continue participation in the sleep smart study and she has been randomized to the medical treatment.  Followup in the future with me in 6 months or call earlier if necessary. Greater than 50% of time during this 35 minute visit was spent on counseling,explanation of diagnosis of cryptogenic stroke and sleep apnea, planning of further management, discussion with patient and family and coordination of care Antony Contras, MD Note: This document was prepared with digital dictation and possible smart phrase technology. Any transcriptional errors that result from this process are unintentional

## 2021-09-10 ENCOUNTER — Telehealth: Payer: Self-pay | Admitting: *Deleted

## 2021-09-10 ENCOUNTER — Encounter: Payer: Self-pay | Admitting: "Endocrinology

## 2021-09-10 ENCOUNTER — Other Ambulatory Visit: Payer: Self-pay | Admitting: Neurology

## 2021-09-10 LAB — LIPID PANEL
Chol/HDL Ratio: 3.3 ratio (ref 0.0–4.4)
Cholesterol, Total: 210 mg/dL — ABNORMAL HIGH (ref 100–199)
HDL: 63 mg/dL (ref >39)
LDL Chol Calc (NIH): 125 mg/dL — ABNORMAL HIGH (ref 0–99)
Triglycerides: 122 mg/dL (ref 0–149)
VLDL Cholesterol Cal: 22 mg/dL (ref 5–40)

## 2021-09-10 LAB — HEMOGLOBIN A1C
Est. average glucose Bld gHb Est-mCnc: 206 mg/dL
Hgb A1c MFr Bld: 8.8 % — ABNORMAL HIGH (ref 4.8–5.6)

## 2021-09-10 MED ORDER — REPATHA SURECLICK 140 MG/ML ~~LOC~~ SOAJ: 140.0000 mg | SUBCUTANEOUS | 3 refills | Status: DC

## 2021-09-10 NOTE — Progress Notes (Signed)
Kindly inform the patient that both cholesterol profile and screening test for diabetes are not satisfactory.  I am going to prescribe Repatha injections which she needs to take in addition to her Lipitor to get her cholesterol down.  She needs to see her primary care physician for better diabetes control.

## 2021-09-10 NOTE — Telephone Encounter (Signed)
-----   Message from Garvin Fila, MD sent at 09/10/2021  8:29 AM EDT ----- Mitchell Heir inform the patient that both cholesterol profile and screening test for diabetes are not satisfactory.  I am going to prescribe Repatha injections which she needs to take in addition to her Lipitor to get her cholesterol down.  She needs to see her primary care physician for better diabetes control.

## 2021-09-10 NOTE — Telephone Encounter (Signed)
I called the pt and spoke with her daughter (ok per dpr). She verbalized understanding and appreciation for the call. She reports the pharmacy call this am about Repatha and rx needing a pa. I advised we have completed this information and PA was approved this morning.   She will call back if any issues come up with taking this medication or questions once she discusses witt the pt.   I have fwd results to pt's endocrinology office.

## 2021-09-10 NOTE — Telephone Encounter (Signed)
[id number] approved through 03/13/2022.

## 2021-09-10 NOTE — Telephone Encounter (Signed)
PA for Repatha 169m started on covermymeds (key: [id number]). Pharmacy coverage through [employer] [phone]. Decision pending.

## 2021-09-15 ENCOUNTER — Ambulatory Visit (INDEPENDENT_AMBULATORY_CARE_PROVIDER_SITE_OTHER): Payer: Medicare Other | Admitting: Orthopedic Surgery

## 2021-09-15 ENCOUNTER — Encounter: Payer: Self-pay | Admitting: Orthopedic Surgery

## 2021-09-15 ENCOUNTER — Ambulatory Visit: Payer: Medicare Other

## 2021-09-15 ENCOUNTER — Ambulatory Visit (INDEPENDENT_AMBULATORY_CARE_PROVIDER_SITE_OTHER): Payer: Medicare Other

## 2021-09-15 VITALS — BP 133/70 | HR 81 | Ht 68.0 in | Wt 225.0 lb

## 2021-09-15 DIAGNOSIS — M25561 Pain in right knee: Secondary | ICD-10-CM

## 2021-09-15 DIAGNOSIS — M25562 Pain in left knee: Secondary | ICD-10-CM

## 2021-09-15 DIAGNOSIS — M1712 Unilateral primary osteoarthritis, left knee: Secondary | ICD-10-CM

## 2021-09-15 DIAGNOSIS — G8929 Other chronic pain: Secondary | ICD-10-CM | POA: Diagnosis not present

## 2021-09-15 DIAGNOSIS — M1711 Unilateral primary osteoarthritis, right knee: Secondary | ICD-10-CM

## 2021-09-15 DIAGNOSIS — M170 Bilateral primary osteoarthritis of knee: Secondary | ICD-10-CM | POA: Diagnosis not present

## 2021-09-15 MED ORDER — METHYLPREDNISOLONE ACETATE 40 MG/ML IJ SUSP
40.0000 mg | Freq: Once | INTRAMUSCULAR | Status: AC
  Administered 2021-09-15: 40 mg via INTRA_ARTICULAR

## 2021-09-15 NOTE — Patient Instructions (Signed)

## 2021-09-15 NOTE — Progress Notes (Addendum)
Chief Complaint  Patient presents with   Knee Pain    Bilateral right worse than left states does not want injections     71 year old female history of congestive heart failure TIAs CO PD diabetes multiple blood clots on Eliquis also has hypertension hypothyroidism atrial fibrillation stage III kidney disease presents with bilateral knee pain worse on the right than the left with a feeling that the knees are giving out  However, she says that the knees have felt like that the giving out since she was 12  She does use a cane  Her x-rays show normal valgus alignment mild DJD on the x-ray a little bit more disease in the patellofemoral joint  However, there is not much I can do in this situation.  She is on Eliquis she can only take Tylenol for pain from me.  If she wants to seek further pain management options with primary care that is fine but no opioids will be administered here unless there are surgery or fracture  Plan offered injections she was agreeable to that and we went ahead and did that  Procedure note for bilateral knee injections  Procedure note left knee injection verbal consent was obtained to inject left knee joint  Timeout was completed to confirm the site of injection  The medications used were 40 mg depomedrol and 3 cc of 1% lidocaine  Anesthesia was provided by ethyl chloride and the skin was prepped with alcohol.  After cleaning the skin with alcohol a 20-gauge needle was used to inject the left knee joint. There were no complications. A sterile bandage was applied.   Procedure note right knee injection verbal consent was obtained to inject right knee joint  Timeout was completed to confirm the site of injection  The medications used were 40 mg depomedrol and 3 cc of 1% lidocaine  Anesthesia was provided by ethyl chloride and the skin was prepped with alcohol.  After cleaning the skin with alcohol a 20-gauge needle was used to inject the right knee joint. There  were no complications. A sterile bandage was applied.   FU PRN

## 2021-09-15 NOTE — Progress Notes (Signed)
Chief Complaint  Patient presents with   Knee Pain      Bilateral right worse than left states does not want injections       71 year old female history of congestive heart failure TIAs CO PD diabetes multiple blood clots on Eliquis also has hypertension hypothyroidism atrial fibrillation stage III kidney disease presents with bilateral knee pain worse on the right than the left with a feeling that the knees are giving out  However, she says that the knees have felt like that the giving out since she was 12  She does use a cane  Her x-rays show normal valgus alignment mild DJD on the x-ray a little bit more disease in the patellofemoral joint  However, there is not much I can do in this situation.  She is on Eliquis she can only take Tylenol for pain from me.  If she wants to seek further pain management options with primary care that is fine but no opioids will be administered here unless there are surgery or fracture  Plan offered injections she was agreeable to that and we went ahead and did that   Procedure note for bilateral knee injections   Procedure note left knee injection verbal consent was obtained to inject left knee joint   Timeout was completed to confirm the site of injection   The medications used were 40 mg depomedrol and 3 cc of 1% lidocaine  Anesthesia was provided by ethyl chloride and the skin was prepped with alcohol.   After cleaning the skin with alcohol a 20-gauge needle was used to inject the left knee joint. There were no complications. A sterile bandage was applied.     Procedure note right knee injection verbal consent was obtained to inject right knee joint   Timeout was completed to confirm the site of injection   The medications used were 40 mg depomedrol and 3 cc of 1% lidocaine  Anesthesia was provided by ethyl chloride and the skin was prepped with alcohol.   After cleaning the skin with alcohol a 20-gauge needle was used to inject the right  knee joint. There were no complications. A sterile bandage was applied.    FU PRN

## 2021-09-24 NOTE — Progress Notes (Unsigned)
 Cardiology Office Note    Date:  09/24/2021   ID:  Samariah W Merry, DOB 10/28/1950, MRN [id number]  PCP:  McCorkle, Tenika, FNP  Cardiologist: Samuel McDowell, MD    No chief complaint on file.   History of Present Illness:    Natoria W Palmatier is a 71 y.o. female with past medical history of HFpEF, sinus tachycardia, HTN, HLD, Type 2 DM, IBS, history of DVT (on chronic anticoagulation with Eliquis), MGUS and COPD who presents to the office today for 3-month follow-up.  She was last examined by Dr. McDowell in 06/2021 and had recently been hospitalized for an acute CVA.  She did have a ZIO monitor in place with results pending. Recent echocardiogram did show her EF was mildly reduced at 45 to 50% and she was continued on Bisoprolol, Jardiance and Torsemide with plans for further adjustment in medical therapy if her EF remained reduced. Her monitor resulted later that month and showed predominantly normal sinus rhythm with occasional PAC's and PVC's but no sustained arrhythmias.  She did have follow-up labs in the interim with Neurology and LDL remained elevated at 125. It was therefore recommended that she begin Repatha.   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   Type 2 diabetes mellitus (HCC)     Past Surgical History:  Procedure Laterality Date   ABDOMINAL HYSTERECTOMY  partial   CARPAL TUNNEL RELEASE Right 1991   CATARACT EXTRACTION W/PHACO Right 05/08/2013   Procedure: CATARACT EXTRACTION PHACO AND INTRAOCULAR LENS  PLACEMENT (IOC);  Surgeon: Kerry Hunt, MD;  Location: [facility];  Service: Ophthalmology;  Laterality: Right;  CDE 10.31   CATARACT EXTRACTION W/PHACO Left 08/17/2013   Procedure: CATARACT EXTRACTION PHACO AND INTRAOCULAR LENS PLACEMENT (IOC);  Surgeon: Kerry Hunt, MD;  Location: [facility];  Service: Ophthalmology;  Laterality: Left;  CDE:9.03   CHOLECYSTECTOMY  1971   COLONOSCOPY WITH PROPOFOL N/A 01/06/2016   Dr. Rourk: diverticulosis    DENTAL SURGERY     ESOPHAGEAL BRUSHING  08/29/2019   Procedure: ESOPHAGEAL BRUSHING;  Surgeon: Rourk, Robert M, MD;  Location: [facility];  Service: Endoscopy;;   ESOPHAGOGASTRODUODENOSCOPY (EGD) WITH PROPOFOL N/A 01/06/2016   Dr. Rourk: normal s/p empiric dilation    ESOPHAGOGASTRODUODENOSCOPY (EGD) WITH PROPOFOL N/A 08/29/2019   esophageal plaques vs medication residue adherent to tubular esophagus s/p KOH brushing and dilation. Medium-sized hiatal hernia. + for candida. Diflucan.    EYE SURGERY     MALONEY DILATION N/A 01/06/2016   Procedure: MALONEY DILATION;  Surgeon: Robert M Rourk, MD;  Location: [facility];  Service: Endoscopy;  Laterality: N/A;   MALONEY DILATION N/A 08/29/2019   Procedure: MALONEY DILATION;  Surgeon: Rourk, Robert M, MD;  Location: [facility];  Service: Endoscopy;  Laterality: N/A;   REVERSE SHOULDER ARTHROPLASTY Right 08/06/2017   Procedure: RIGHT REVERSE SHOULDER ARTHROPLASTY;  Surgeon: Norris, Steve, MD;  Location: [facility];  Service: Orthopedics;  Laterality: Right;   WRIST   GANGLION EXCISION Left     Current Medications: Outpatient Medications Prior to Visit  Medication Sig Dispense Refill   Accu-Chek FastClix Lancets MISC Apply topically.     ACCU-CHEK GUIDE test strip 4 (four) times daily.     acetaminophen (TYLENOL) 500 MG tablet Take 1,000 mg by mouth every 6 (six) hours as needed for moderate pain or headache.     albuterol (PROVENTIL) (2.5 MG/3ML) 0.083% nebulizer solution Take 2.5 mg by nebulization every 6 (six) hours  as needed for wheezing or shortness of breath.     albuterol (VENTOLIN HFA) 108 (90 Base) MCG/ACT inhaler Inhale 1-2 puffs into the lungs every 6 (six) hours as needed for wheezing or shortness of breath. 18 g 0   Alcohol Swabs (ALCOHOL PADS) 70 % PADS SMARTSIG:Pledget(s) Topical 4 Times Daily     apixaban (ELIQUIS) 5 MG TABS tablet Take 1 tablet (5 mg total) by mouth 2 (two) times daily.     ARNUITY ELLIPTA 100 MCG/ACT AEPB Inhale 1 puff into the lungs at bedtime.   11   Ascorbic Acid (VITAMIN C) 1000 MG tablet Take 500 mg by mouth daily.     atorvastatin (LIPITOR) 80 MG tablet Take 1 tablet (80 mg total) by mouth daily. 30 tablet 0   bisoprolol (ZEBETA) 10 MG tablet Take 1 tablet (10 mg total) by mouth in the morning and at bedtime. 180 tablet 3   Blood Glucose Calibration (ACCU-CHEK GUIDE CONTROL) LIQD See admin instructions.     Blood Glucose Monitoring Suppl (ACCU-CHEK GUIDE ME) w/Device KIT 4 (four) times daily.     Cetirizine HCl 10 MG CAPS Take 10 mg by mouth daily. (Patient not taking: Reported on 09/15/2021)     Cholecalciferol (VITAMIN D) 2000 units tablet Take 4,000 Units by mouth daily.      cimetidine (TAGAMET) 200 MG tablet Take 0.5 tablets (100 mg total) by mouth daily as needed. (Patient taking differently: Take 100 mg by mouth daily as needed (indigestion).)     CINNAMON PO Take 1-2 capsules by mouth 3 (three) times daily.     clobetasol ointment (TEMOVATE) 0.05 % Apply 1 application topically 3 (three) times daily as needed (imflammation).     diphenhydrAMINE (BENADRYL) 25 mg capsule Take 50 mg by mouth every 4 (four) hours as needed for itching.     DULoxetine (CYMBALTA) 30 MG capsule Take 1 capsule (30 mg total) by mouth 2 (two) times daily. 30 capsule -0   EASY COMFORT PEN NEEDLES 31G X 5 MM MISC INJECT IN SULIN EVERY DAY AS DIRECTED     empagliflozin (JARDIANCE) 10 MG TABS tablet Take 20 mg by mouth daily.     Evolocumab (REPATHA SURECLICK) 140 MG/ML SOAJ Inject 140 mg into  the skin every 14 (fourteen) days. 2 mL 3   fluticasone (VERAMYST) 27.5 MCG/SPRAY nasal spray Place 2 sprays into the nose daily.     guaiFENesin (ROBITUSSIN) 100 MG/5ML SOLN Take 5 mLs (100 mg total) by mouth every 4 (four) hours as needed for cough or to loosen phlegm. 236 mL 0   HUMALOG KWIKPEN 100 UNIT/ML KiwkPen Inject 8-16 Units into the skin 3 (three) times daily. Sliding Scale  5   hydrOXYzine (ATARAX/VISTARIL) 25 MG tablet Take 25 mg by mouth every 8 (eight) hours as needed for itching.      ipratropium-albuterol (DUONEB) 0.5-2.5 (3) MG/3ML SOLN Take 3 mLs by nebulization every 6 (six) hours as needed (shortness of breath).     Lancet Devices (EASY   MINI EJECT LANCING DEVICE) MISC 4 (four) times daily.     levothyroxine (SYNTHROID) 88 MCG tablet Take 88 mcg by mouth daily before breakfast.     lubiprostone (AMITIZA) 8 MCG capsule TAKE 1 CAPSULE(8 MCG) BY MOUTH TWICE DAILY WITH A MEAL (Patient taking differently: Take 8 mcg by mouth daily as needed (IBS). With meals) 60 capsule 3   LYRICA 75 MG capsule Take 75 mg by mouth 2 (two) times daily.      methocarbamol (ROBAXIN) 500 MG tablet Take 1 tablet (500 mg total) by mouth 3 (three) times daily as needed. (Patient taking differently: Take 500 mg by mouth 3 (three) times daily as needed for muscle spasms.) 60 tablet 1   montelukast (SINGULAIR) 10 MG tablet Take 10 mg by mouth at bedtime.     omega-3 acid ethyl esters (LOVAZA) 1 g capsule Take 2 capsules (2 g total) by mouth 2 (two) times daily. 120 capsule 0   pantoprazole (PROTONIX) 40 MG tablet Take 1 tablet (40 mg total) by mouth daily. 90 tablet 3   potassium chloride (KLOR-CON) 10 MEQ tablet Take 10 mEq by mouth daily.     RESTASIS 0.05 % ophthalmic emulsion Place 1 drop into both eyes 2 (two) times daily.     torsemide (DEMADEX) 20 MG tablet Take 2 tablets (40 mg total) by mouth daily. 60 tablet 0   traMADol (ULTRAM) 50 MG tablet Take 50 mg by mouth 3 (three) times daily as needed for  moderate pain.  1   traZODone (DESYREL) 50 MG tablet Take 50-100 mg by mouth at bedtime as needed for sleep.     vitamin B-12 (CYANOCOBALAMIN) 1000 MCG tablet Take 1,000 mcg by mouth daily.     XULTOPHY 100-3.6 UNIT-MG/ML SOPN Inject 85 Units as directed at bedtime. (Patient taking differently: Inject 40 Units as directed every morning.)  0   No facility-administered medications prior to visit.     Allergies:   Tetracyclines & related, Banana, and Penicillins   Social History   Socioeconomic History   Marital status: Married    Spouse name: Not on file   Number of children: Not on file   Years of education: Not on file   Highest education level: Not on file  Occupational History   Not on file  Tobacco Use   Smoking status: Former    Packs/day: 0.25    Years: 30.00    Total pack years: 7.50    Types: Cigarettes    Quit date: 02/17/2011    Years since quitting: 10.6   Smokeless tobacco: Never  Vaping Use   Vaping Use: Never used  Substance and Sexual Activity   Alcohol use: Not Currently    Comment: rare   Drug use: No   Sexual activity: Not Currently    Birth control/protection: Surgical  Other Topics Concern   Not on file  Social History Narrative   Not on file   Social Determinants of Health   Financial Resource Strain: Not on file  Food Insecurity: Not on file  Transportation Needs: Not on file  Physical Activity: Not on file  Stress: Not on file  Social Connections: Not on file     Family History:  The patient's ***family history includes Arthritis in her father and mother; CAD in her father; COPD in her mother; Colon cancer in her niece; Dementia in her father; Diabetes in her brother, father, and mother; Hypertension in her mother; Hypothyroidism in her sister.   Review of   Systems:    Please see the history of present illness.     All other systems reviewed and are otherwise negative except as noted above.   Physical Exam:    VS:  There were no vitals  taken for this visit.   General: Well developed, well nourished,female appearing in no acute distress. Head: Normocephalic, atraumatic. Neck: No carotid bruits. JVD not elevated.  Lungs: Respirations regular and unlabored, without wheezes or rales.  Heart: ***Regular rate and rhythm. No S3 or S4.  No murmur, no rubs, or gallops appreciated. Abdomen: Appears non-distended. No obvious abdominal masses. Msk:  Strength and tone appear normal for age. No obvious joint deformities or effusions. Extremities: No clubbing or cyanosis. No edema.  Distal pedal pulses are 2+ bilaterally. Neuro: Alert and oriented X 3. Moves all extremities spontaneously. No focal deficits noted. Psych:  Responds to questions appropriately with a normal affect. Skin: No rashes or lesions noted  Wt Readings from Last 3 Encounters:  09/15/21 225 lb (102.1 kg)  08/29/21 225 lb (102.1 kg)  08/13/21 224 lb (101.6 kg)        Studies/Labs Reviewed:   EKG:  EKG is*** ordered today.  The ekg ordered today demonstrates ***  Recent Labs: 05/17/2021: B Natriuretic Peptide 1,488.0 07/24/2021: ALT 57; BUN 22; Creatinine, Ser 1.18; Potassium 5.0; Sodium 137; TSH 2.213 08/29/2021: Hemoglobin 13.6; Platelets 250   Lipid Panel    Component Value Date/Time   CHOL 210 (H) 09/09/2021 1141   TRIG 122 09/09/2021 1141   HDL 63 09/09/2021 1141   CHOLHDL 3.3 09/09/2021 1141   CHOLHDL 3.2 07/24/2021 1342   VLDL 21 07/24/2021 1342   LDLCALC 125 (H) 09/09/2021 1141    Additional studies/ records that were reviewed today include:   Echocardiogram: 05/2021 IMPRESSIONS     1. Poor acoustic windows limit study INferoseptal wall appears mildly  hyypokinetic no significant change in overall LVEF . Left ventricular  ejection fraction, by estimation, is 45 to 50%. The left ventricle has  mildly decreased function. Left  ventricular diastolic parameters are consistent with Grade II diastolic  dysfunction (pseudonormalization).  Elevated left atrial pressure.   2. Right ventricular systolic function is normal. The right ventricular  size is normal. There is mildly elevated pulmonary artery systolic  pressure.   3. Right atrial size was mildly dilated.   4. The mitral valve is normal in structure. Mild mitral valve  regurgitation.   5. The aortic valve is normal in structure. Aortic valve regurgitation is  not visualized.   6. The inferior vena cava is normal in size with greater than 50%  respiratory variability, suggesting right atrial pressure of 3 mmHg.   Event Monitor: 06/2021 [employer] monitor reviewed.  30 days analyzed.  Predominant rhythm is sinus with heart rate ranging from 66 bpm up to 140 bpm and average heart rate 80 bpm.  There were rare PVCs and PACs representing less than 1% total beats.  No sustained arrhythmias or pauses.   Assessment:    No diagnosis found.   Plan:   In order of problems listed above:  1. HFmrEF - Echocardiogram in 05/2021 showed her EF was mildly reduced at 45 to 50%. ***  2.  Palpitations - She has a history of sinus tachycardia and recent monitor earlier this year showed predominantly normal sinus rhythm with rare PAC's and PVC's but no significant arrhythmias.  3. HTN - ***  4. HLD - Recent FLP showed her LDL remained elevated at 125.  She was started on Repatha by Neurology while remaining on Atorvastatin 80 mg daily.  5. History of DVT - Followed by her PCP. She has been on long-term anticoagulation with Eliquis 5 mg twice daily.   Shared Decision Making/Informed Consent:   {Are you ordering a CV Procedure (e.g. stress test, cath, DCCV, TEE, etc)?   Press F2        :[id number]}    Medication Adjustments/Labs and Tests Ordered: Current medicines are reviewed at length with the patient today.  Concerns regarding medicines are outlined above.  Medication changes, Labs and Tests ordered today are listed in the Patient Instructions below. There are no  Patient Instructions on file for this visit.   Signed,  M , PA-C  09/24/2021 10:03 AM    [facility] [address] [place], [place] [address] Phone: [phone] Fax: [phone]   

## 2021-09-25 ENCOUNTER — Ambulatory Visit (INDEPENDENT_AMBULATORY_CARE_PROVIDER_SITE_OTHER): Payer: Medicare Other | Admitting: Student

## 2021-09-25 ENCOUNTER — Encounter: Payer: Self-pay | Admitting: Student

## 2021-09-25 VITALS — BP 134/80 | HR 87 | Ht 68.0 in | Wt 227.0 lb

## 2021-09-25 DIAGNOSIS — I10 Essential (primary) hypertension: Secondary | ICD-10-CM | POA: Diagnosis not present

## 2021-09-25 DIAGNOSIS — I5022 Chronic systolic (congestive) heart failure: Secondary | ICD-10-CM

## 2021-09-25 DIAGNOSIS — R002 Palpitations: Secondary | ICD-10-CM | POA: Diagnosis not present

## 2021-09-25 DIAGNOSIS — E785 Hyperlipidemia, unspecified: Secondary | ICD-10-CM | POA: Diagnosis not present

## 2021-09-25 DIAGNOSIS — Z86718 Personal history of other venous thrombosis and embolism: Secondary | ICD-10-CM

## 2021-09-25 NOTE — Patient Instructions (Signed)
Medication Instructions:  Continue current medication regimen.   *If you need a refill on your cardiac medications before your next appointment, please call your pharmacy*    Testing/Procedures:  We will arrange for you to have an echocardiogram (ultrasound of your heart)   Follow-Up: At [facility], you and your health needs are our priority.  As part of our continuing mission to provide you with exceptional heart care, we have created designated Provider Care Teams.  These Care Teams include your primary Cardiologist (physician) and Advanced Practice Providers (APPs -  Physician Assistants and Nurse Practitioners) who all work together to provide you with the care you need, when you need it.  We recommend signing up for the patient portal called "MyChart".  Sign up information is provided on this After Visit Summary.  MyChart is used to connect with patients for Virtual Visits (Telemedicine).  Patients are able to view lab/test results, encounter notes, upcoming appointments, etc.  Non-urgent messages can be sent to your provider as well.   To learn more about what you can do with MyChart, go to [email or web address].    Your next appointment:   6 month(s)  The format for your next appointment:   In Person  Provider:   You may see Rozann Lesches, MD or one of the following Advanced Practice Providers on your designated Care Team:   Bernerd Pho, PA-C  Ermalinda Barrios, PA-C {  Important Information About Sugar

## 2021-10-06 ENCOUNTER — Ambulatory Visit (HOSPITAL_COMMUNITY)
Admission: RE | Admit: 2021-10-06 | Discharge: 2021-10-06 | Disposition: A | Discharge status: Home / Self Care | Payer: Medicare Other | Source: Ambulatory Visit | Attending: Student | Admitting: Student

## 2021-10-06 DIAGNOSIS — I5022 Chronic systolic (congestive) heart failure: Secondary | ICD-10-CM | POA: Insufficient documentation

## 2021-10-06 LAB — ECHOCARDIOGRAM LIMITED
Calc EF: 37.3 %
S' Lateral: 3.45 cm
Single Plane A2C EF: 44.2 %
Single Plane A4C EF: 36.9 %

## 2021-10-06 MED ORDER — PERFLUTREN LIPID MICROSPHERE
1.0000 mL | INTRAVENOUS | Status: AC | PRN
  Administered 2021-10-06: 4 mL via INTRAVENOUS

## 2021-10-06 NOTE — Progress Notes (Signed)
*  PRELIMINARY RESULTS* Echocardiogram Limited 2-D Echocardiogram  has been performed with Definity.  Samuel Germany 10/06/2021, 4:13 PM

## 2021-10-08 ENCOUNTER — Inpatient Hospital Stay: Payer: Medicare Other | Attending: Hematology

## 2021-10-08 DIAGNOSIS — Z86718 Personal history of other venous thrombosis and embolism: Secondary | ICD-10-CM | POA: Insufficient documentation

## 2021-10-08 DIAGNOSIS — Z7901 Long term (current) use of anticoagulants: Secondary | ICD-10-CM | POA: Insufficient documentation

## 2021-10-08 DIAGNOSIS — N1831 Chronic kidney disease, stage 3a: Secondary | ICD-10-CM | POA: Insufficient documentation

## 2021-10-08 DIAGNOSIS — Z87891 Personal history of nicotine dependence: Secondary | ICD-10-CM | POA: Insufficient documentation

## 2021-10-08 DIAGNOSIS — D472 Monoclonal gammopathy: Secondary | ICD-10-CM | POA: Insufficient documentation

## 2021-10-08 DIAGNOSIS — D509 Iron deficiency anemia, unspecified: Secondary | ICD-10-CM

## 2021-10-08 LAB — CBC WITH DIFFERENTIAL/PLATELET
Abs Immature Granulocytes: 0.08 10*3/uL — ABNORMAL HIGH (ref 0.00–0.07)
Basophils Absolute: 0.1 10*3/uL (ref 0.0–0.1)
Basophils Relative: 0 %
Eosinophils Absolute: 0.7 10*3/uL — ABNORMAL HIGH (ref 0.0–0.5)
Eosinophils Relative: 5 %
HCT: 42.7 % (ref 36.0–46.0)
Hemoglobin: 13.1 g/dL (ref 12.0–15.0)
Immature Granulocytes: 1 %
Lymphocytes Relative: 21 %
Lymphs Abs: 3 10*3/uL (ref 0.7–4.0)
MCH: 25.9 pg — ABNORMAL LOW (ref 26.0–34.0)
MCHC: 30.7 g/dL (ref 30.0–36.0)
MCV: 84.6 fL (ref 80.0–100.0)
Monocytes Absolute: 0.9 10*3/uL (ref 0.1–1.0)
Monocytes Relative: 6 %
Neutro Abs: 9.5 10*3/uL — ABNORMAL HIGH (ref 1.7–7.7)
Neutrophils Relative %: 67 %
Platelets: 203 10*3/uL (ref 150–400)
RBC: 5.05 MIL/uL (ref 3.87–5.11)
RDW: 14.2 % (ref 11.5–15.5)
WBC: 14.2 10*3/uL — ABNORMAL HIGH (ref 4.0–10.5)
nRBC: 0 % (ref 0.0–0.2)

## 2021-10-08 LAB — COMPREHENSIVE METABOLIC PANEL
ALT: 94 U/L — ABNORMAL HIGH (ref 0–44)
AST: 69 U/L — ABNORMAL HIGH (ref 15–41)
Albumin: 2.9 g/dL — ABNORMAL LOW (ref 3.5–5.0)
Alkaline Phosphatase: 399 U/L — ABNORMAL HIGH (ref 38–126)
Anion gap: 4 — ABNORMAL LOW (ref 5–15)
BUN: 30 mg/dL — ABNORMAL HIGH (ref 8–23)
CO2: 25 mmol/L (ref 22–32)
Calcium: 8.9 mg/dL (ref 8.9–10.3)
Chloride: 110 mmol/L (ref 98–111)
Creatinine, Ser: 1.28 mg/dL — ABNORMAL HIGH (ref 0.44–1.00)
GFR, Estimated: 45 mL/min — ABNORMAL LOW (ref >60)
Glucose, Bld: 170 mg/dL — ABNORMAL HIGH (ref 70–99)
Potassium: 4.2 mmol/L (ref 3.5–5.1)
Sodium: 139 mmol/L (ref 135–145)
Total Bilirubin: 0.6 mg/dL (ref 0.3–1.2)
Total Protein: 8.7 g/dL — ABNORMAL HIGH (ref 6.5–8.1)

## 2021-10-08 LAB — IRON AND TIBC
Iron: 83 ug/dL (ref 28–170)
Saturation Ratios: 28 % (ref 10.4–31.8)
TIBC: 295 ug/dL (ref 250–450)
UIBC: 212 ug/dL

## 2021-10-08 LAB — LACTATE DEHYDROGENASE: LDH: 200 U/L — ABNORMAL HIGH (ref 98–192)

## 2021-10-09 ENCOUNTER — Inpatient Hospital Stay: Payer: Medicare Other

## 2021-10-09 LAB — KAPPA/LAMBDA LIGHT CHAINS
Kappa free light chain: 905.3 mg/L — ABNORMAL HIGH (ref 3.3–19.4)
Kappa, lambda light chain ratio: 43.11 — ABNORMAL HIGH (ref 0.26–1.65)
Lambda free light chains: 21 mg/L (ref 5.7–26.3)

## 2021-10-10 LAB — PROTEIN ELECTROPHORESIS, SERUM
A/G Ratio: 0.6 — ABNORMAL LOW (ref 0.7–1.7)
Albumin ELP: 2.8 g/dL — ABNORMAL LOW (ref 2.9–4.4)
Alpha-1-Globulin: 0.3 g/dL (ref 0.0–0.4)
Alpha-2-Globulin: 1.1 g/dL — ABNORMAL HIGH (ref 0.4–1.0)
Beta Globulin: 1.2 g/dL (ref 0.7–1.3)
Gamma Globulin: 2.4 g/dL — ABNORMAL HIGH (ref 0.4–1.8)
Globulin, Total: 4.9 g/dL — ABNORMAL HIGH (ref 2.2–3.9)
M-Spike, %: 1.8 g/dL — ABNORMAL HIGH
Total Protein ELP: 7.7 g/dL (ref 6.0–8.5)

## 2021-10-15 NOTE — Progress Notes (Unsigned)
[place], [place] [id number]   CLINIC:  Medical Oncology/Hematology  PCP:  [name], [place] [address] [phone]   REASON FOR VISIT:  Follow-up for MGUS, history of DVT, leukocytosis, microcytic anemia  CURRENT THERAPY: Apixaban for DVT.  Surveillance.  INTERVAL HISTORY:  Stephanie [name] 71 y.o. 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  She was last seen by Tarri Abernethy PA-C on 06/09/2021.  At today's visit, she reports feeling fair.   She has not had any hospitalizations since her last visit.  She continues to have significant chronic fatigue that is unchanged.  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  Overall, her weight has been stable although she does have some extra "fluid weight that needs to come off."  She has some dyspnea on exertion and bilateral peripheral edema 2+. She has not noticed any new lumps or bumps. She continues to take Eliquis for history of DVT.  She denies any current signs or symptoms of DVT or PE.  She denies any major bleeding events such as epistaxis, hematemesis, hematochezia, or melena.  She has little to no energy and 50% appetite. She endorses that she is maintaining a stable weight.   REVIEW OF SYSTEMS:    Review of Systems  Constitutional:  Positive for fatigue. Negative for appetite change, chills, diaphoresis, fever and unexpected weight change.  HENT:   Negative for lump/mass and nosebleeds.   Eyes:  Negative for eye problems.  Respiratory:  Positive for shortness of breath (With exertion). Negative for cough and hemoptysis.   Cardiovascular:  Positive for leg swelling. Negative for chest pain and palpitations.  Gastrointestinal:  Negative for abdominal pain, blood in stool, constipation, diarrhea, nausea and vomiting.  Genitourinary:  Negative for hematuria.   Skin:  Negative.   Neurological:  Positive for numbness. Negative for dizziness, headaches and light-headedness.  Hematological:  Does not bruise/bleed easily.  Psychiatric/Behavioral:  Positive for sleep disturbance.       PAST MEDICAL/SURGICAL HISTORY:  Past Medical History:  Diagnosis Date   Anemia    Arthritis    Asthma    COPD (chronic obstructive pulmonary disease) ([place])    COVID-19    Deep vein thrombosis (DVT) of both lower extremities ([place]) 06/27/2015   Fibromyalgia    GERD (gastroesophageal reflux disease)    H/O hiatal hernia    Hypercholesteremia    Hypertension    Hyperthyroidism    IBS (irritable bowel syndrome)    Inappropriate sinus tachycardia    Inner ear disease    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   Type 2 diabetes mellitus ([place])    Past Surgical History:  Procedure Laterality Date   ABDOMINAL HYSTERECTOMY  partial   CARPAL TUNNEL RELEASE Right 1991   CATARACT EXTRACTION W/PHACO Right 05/08/2013   Procedure: CATARACT EXTRACTION PHACO AND INTRAOCULAR LENS PLACEMENT ([place]);  Surgeon: Tonny Branch, MD;  Location: [facility];  Service: Ophthalmology;  Laterality: Right;  CDE 10.31   CATARACT EXTRACTION W/PHACO Left 08/17/2013   Procedure: CATARACT EXTRACTION PHACO AND INTRAOCULAR LENS PLACEMENT ([facility]);  Surgeon: Tonny Branch, MD;  Location: [facility];  Service: Ophthalmology;  Laterality: Left;  CDE:9.03   CHOLECYSTECTOMY  1971   COLONOSCOPY WITH PROPOFOL N/A 01/06/2016   Dr. Gala Romney: diverticulosis    DENTAL SURGERY     ESOPHAGEAL BRUSHING  08/29/2019  Procedure: ESOPHAGEAL BRUSHING;  Surgeon: Daneil Dolin, MD;  Location: [facility];  Service: Endoscopy;;   ESOPHAGOGASTRODUODENOSCOPY (EGD) WITH PROPOFOL N/A 01/06/2016   Dr. Gala Romney: normal s/p empiric dilation    ESOPHAGOGASTRODUODENOSCOPY (EGD) WITH PROPOFOL N/A 08/29/2019   esophageal plaques vs medication residue adherent to tubular esophagus s/p KOH brushing and dilation. Medium-sized hiatal  hernia. + for candida. Diflucan.    EYE SURGERY     MALONEY DILATION N/A 01/06/2016   Procedure: Venia Minks DILATION;  Surgeon: Daneil Dolin, MD;  Location: [facility];  Service: Endoscopy;  Laterality: N/A;   MALONEY DILATION N/A 08/29/2019   Procedure: Venia Minks DILATION;  Surgeon: Daneil Dolin, MD;  Location: [facility];  Service: Endoscopy;  Laterality: N/A;   REVERSE SHOULDER ARTHROPLASTY Right 08/06/2017   Procedure: RIGHT REVERSE SHOULDER ARTHROPLASTY;  Surgeon: Netta Cedars, MD;  Location: [facility];  Service: Orthopedics;  Laterality: Right;   WRIST GANGLION EXCISION Left      SOCIAL HISTORY:  Social History   Socioeconomic History   Marital status: Married    Spouse name: Not on file   Number of children: Not on file   Years of education: Not on file   Highest education level: Not on file  Occupational History   Not on file  Tobacco Use   Smoking status: Former    Packs/day: 0.25    Years: 30.00    Total pack years: 7.50    Types: Cigarettes    Quit date: 02/17/2011    Years since quitting: 10.6   Smokeless tobacco: Never  Vaping Use   Vaping Use: Never used  Substance and Sexual Activity   Alcohol use: Not Currently    Comment: rare   Drug use: No   Sexual activity: Not Currently    Birth control/protection: Surgical  Other Topics Concern   Not on file  Social History Narrative   Not on file   Social Determinants of Health   Financial Resource Strain: Not on file  Food Insecurity: Not on file  Transportation Needs: Not on file  Physical Activity: Not on file  Stress: Not on file  Social Connections: Not on file  Intimate Partner Violence: Not on file    FAMILY HISTORY:  Family History  Problem Relation Age of Onset   Hypertension Mother    Diabetes Mother    COPD Mother    Arthritis Mother    Diabetes Father    Arthritis Father    Dementia Father    CAD Father    Hypothyroidism Sister    Diabetes Brother    Colon cancer Niece    Colon  polyps Neg Hx     CURRENT MEDICATIONS:  Outpatient Encounter Medications as of 10/16/2021  Medication Sig   Accu-Chek FastClix Lancets MISC Apply topically.   ACCU-CHEK GUIDE test strip 4 (four) times daily.   acetaminophen (TYLENOL) 500 MG tablet Take 1,000 mg by mouth every 6 (six) hours as needed for moderate pain or headache.   albuterol (PROVENTIL) (2.5 MG/3ML) 0.083% nebulizer solution Take 2.5 mg by nebulization every 6 (six) hours as needed for wheezing or shortness of breath.   albuterol (VENTOLIN HFA) 108 (90 Base) MCG/ACT inhaler Inhale 1-2 puffs into the lungs every 6 (six) hours as needed for wheezing or shortness of breath.   Alcohol Swabs (ALCOHOL PADS) 70 % PADS SMARTSIG:Pledget(s) Topical 4 Times Daily   apixaban (ELIQUIS) 5 MG TABS tablet Take 1 tablet (5 mg total) by mouth 2 (  two) times daily.   ARNUITY ELLIPTA 100 MCG/ACT AEPB Inhale 1 puff into the lungs at bedtime.    Ascorbic Acid (VITAMIN C) 1000 MG tablet Take 500 mg by mouth daily.   atorvastatin (LIPITOR) 80 MG tablet Take 1 tablet (80 mg total) by mouth daily.   bisoprolol (ZEBETA) 10 MG tablet Take 1 tablet (10 mg total) by mouth in the morning and at bedtime.   Blood Glucose Calibration (ACCU-CHEK GUIDE CONTROL) LIQD See admin instructions.   Blood Glucose Monitoring Suppl (ACCU-CHEK GUIDE ME) w/Device KIT 4 (four) times daily.   Cetirizine HCl 10 MG CAPS Take 10 mg by mouth daily. (Patient not taking: Reported on 09/15/2021)   Cholecalciferol (VITAMIN D) 2000 units tablet Take 4,000 Units by mouth daily.    cimetidine (TAGAMET) 200 MG tablet Take 0.5 tablets (100 mg total) by mouth daily as needed. (Patient taking differently: Take 100 mg by mouth daily as needed (indigestion).)   CINNAMON PO Take 1-2 capsules by mouth 3 (three) times daily.   clobetasol ointment (TEMOVATE) 9.16 % Apply 1 application topically 3 (three) times daily as needed (imflammation).   diphenhydrAMINE (BENADRYL) 25 mg capsule Take 50 mg by  mouth every 4 (four) hours as needed for itching.   DULoxetine (CYMBALTA) 30 MG capsule Take 1 capsule (30 mg total) by mouth 2 (two) times daily.   EASY COMFORT PEN NEEDLES 31G X 5 MM MISC INJECT IN SULIN EVERY DAY AS DIRECTED   empagliflozin (JARDIANCE) 10 MG TABS tablet Take 20 mg by mouth daily.   Evolocumab (REPATHA SURECLICK) 945 MG/ML SOAJ Inject 140 mg into the skin every 14 (fourteen) days.   fluticasone (VERAMYST) 27.5 MCG/SPRAY nasal spray Place 2 sprays into the nose daily.   guaiFENesin (ROBITUSSIN) 100 MG/5ML SOLN Take 5 mLs (100 mg total) by mouth every 4 (four) hours as needed for cough or to loosen phlegm.   HUMALOG KWIKPEN 100 UNIT/ML KiwkPen Inject 8-16 Units into the skin 3 (three) times daily. Sliding Scale   hydrOXYzine (ATARAX/VISTARIL) 25 MG tablet Take 25 mg by mouth every 8 (eight) hours as needed for itching.    ipratropium-albuterol (DUONEB) 0.5-2.5 (3) MG/3ML SOLN Take 3 mLs by nebulization every 6 (six) hours as needed (shortness of breath).   Lancet Devices (EASY MINI EJECT LANCING DEVICE) MISC 4 (four) times daily.   levothyroxine (SYNTHROID) 88 MCG tablet Take 88 mcg by mouth daily before breakfast.   lubiprostone (AMITIZA) 8 MCG capsule TAKE 1 CAPSULE(8 MCG) BY MOUTH TWICE DAILY WITH A MEAL (Patient taking differently: Take 8 mcg by mouth daily as needed (IBS). With meals)   LYRICA 75 MG capsule Take 75 mg by mouth 2 (two) times daily.    methocarbamol (ROBAXIN) 500 MG tablet Take 1 tablet (500 mg total) by mouth 3 (three) times daily as needed. (Patient taking differently: Take 500 mg by mouth 3 (three) times daily as needed for muscle spasms.)   montelukast (SINGULAIR) 10 MG tablet Take 10 mg by mouth at bedtime.   omega-3 acid ethyl esters (LOVAZA) 1 g capsule Take 2 capsules (2 g total) by mouth 2 (two) times daily.   pantoprazole (PROTONIX) 40 MG tablet Take 1 tablet (40 mg total) by mouth daily.   potassium chloride (KLOR-CON) 10 MEQ tablet Take 10 mEq by  mouth daily.   RESTASIS 0.05 % ophthalmic emulsion Place 1 drop into both eyes 2 (two) times daily.   torsemide (DEMADEX) 20 MG tablet Take 2 tablets (40 mg total) by  mouth daily.   traMADol (ULTRAM) 50 MG tablet Take 50 mg by mouth 3 (three) times daily as needed for moderate pain.   traZODone (DESYREL) 50 MG tablet Take 50-100 mg by mouth at bedtime as needed for sleep.   vitamin B-12 (CYANOCOBALAMIN) 1000 MCG tablet Take 1,000 mcg by mouth daily.   XULTOPHY 100-3.6 UNIT-MG/ML SOPN Inject 85 Units as directed at bedtime. (Patient taking differently: Inject 40 Units as directed every morning.)   No facility-administered encounter medications on file as of 10/16/2021.    ALLERGIES:  Allergies  Allergen Reactions   Tetracyclines & Related Anaphylaxis and Rash   Banana Hives and Nausea And Vomiting   Penicillins Rash and Other (See Comments)    Has patient had a PCN reaction causing immediate rash, facial/tongue/throat swelling, SOB or lightheadedness with hypotension: No Has patient had a PCN reaction causing severe rash involving mucus membranes or skin necrosis: No Has patient had a PCN reaction that required hospitalization No Has patient had a PCN reaction occurring within the last 10 years: No If all of the above answers are "NO", then may proceed with Cephalosporin use.      PHYSICAL EXAM:    ECOG PERFORMANCE STATUS: 2 - Symptomatic, <50% confined to bed  There were no vitals filed for this visit. There were no vitals filed for this visit. Physical Exam Constitutional:      Appearance: Normal appearance. She is obese.  HENT:     Head: Normocephalic and atraumatic.     Mouth/Throat:     Mouth: Mucous membranes are moist.  Eyes:     Extraocular Movements: Extraocular movements intact.     Pupils: Pupils are equal, round, and reactive to light.  Cardiovascular:     Rate and Rhythm: Normal rate and regular rhythm.     Pulses: Normal pulses.     Heart sounds: Normal heart  sounds.  Pulmonary:     Effort: Pulmonary effort is normal.     Breath sounds: Decreased air movement present. Decreased breath sounds present.  Abdominal:     General: Bowel sounds are normal.     Palpations: Abdomen is soft.     Tenderness: There is no abdominal tenderness.  Musculoskeletal:        General: No swelling.     Right lower leg: Edema (2+) present.     Left lower leg: Edema (2+) present.  Lymphadenopathy:     Cervical: No cervical adenopathy.  Skin:    General: Skin is warm and dry.  Neurological:     General: No focal deficit present.     Mental Status: She is alert and oriented to person, place, and time.  Psychiatric:        Mood and Affect: Mood normal.        Behavior: Behavior normal.      LABORATORY DATA:  I have reviewed the labs as listed.  CBC    Component Value Date/Time   WBC 14.2 (H) 10/08/2021 1236   RBC 5.05 10/08/2021 1236   HGB 13.1 10/08/2021 1236   HCT 42.7 10/08/2021 1236   PLT 203 10/08/2021 1236   MCV 84.6 10/08/2021 1236   MCH 25.9 (L) 10/08/2021 1236   MCHC 30.7 10/08/2021 1236   RDW 14.2 10/08/2021 1236   LYMPHSABS 3.0 10/08/2021 1236   MONOABS 0.9 10/08/2021 1236   EOSABS 0.7 (H) 10/08/2021 1236   BASOSABS 0.1 10/08/2021 1236      Latest Ref Rng & Units 10/08/2021  12:36 PM 07/24/2021    1:41 PM 05/20/2021    6:42 AM  CMP  Glucose 70 - 99 mg/dL 170  225  100   BUN 8 - 23 mg/dL 30  22  14    Creatinine 0.44 - 1.00 mg/dL 1.28  1.18  1.07   Sodium 135 - 145 mmol/L 139  137  141   Potassium 3.5 - 5.1 mmol/L 4.2  5.0  3.7   Chloride 98 - 111 mmol/L 110  106  103   CO2 22 - 32 mmol/L 25  23  31    Calcium 8.9 - 10.3 mg/dL 8.9  9.1  8.7   Total Protein 6.5 - 8.1 g/dL 8.7  8.3    Total Bilirubin 0.3 - 1.2 mg/dL 0.6  0.7    Alkaline Phos 38 - 126 U/L 399  384    AST 15 - 41 U/L 69  42    ALT 0 - 44 U/L 94  57      DIAGNOSTIC IMAGING:  I have independently reviewed the relevant imaging and discussed with the  patient.  ASSESSMENT & PLAN: 1.  IgG kappa MGUS: - Bone marrow biopsy on 09/03/2015 showing 9% plasma cells and negative skeletal survey. - History of nephrotic range proteinuria, resolved now. - Skeletal survey (01/30/2021): No evidence of lytic or sclerotic lesions identified. - Most recent myeloma panel (05/12/2021) continues to show labs trending upwards, but without meeting criteria for repeat bone marrow biopsy or multiple myeloma at this time: M spike trending upwards at 1.8% Free light chains remain elevated but relatively stable with 105.3, normal lambda, ratio 43.11 No CRAB features: Calcium 8.9.  Creatinine 1.28 (baseline CKD stage IIIa from diabetes and hypertension). Normal Hgb 13.1. LDH mildly elevated at 200. - No new bone pain of B-symptoms     - PLAN: Due to upwards-trending MGUS labs, we will continue close active surveillance. - Repeat MGUS/myeloma lab in 4 months with office visit 1 week after. - Skeletal survey in 4 months. - If MGUS/myeloma labs continue to increase or she has any development of CRAB symptoms, we will repeat bone marrow biopsy.   2.  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 - She is taking Eliquis without any major bleeding events      - PLAN: Continue Eliquis.   3.  Leukocytosis: - Intermittent leukocytosis since at least 2012 - Denies any recent fevers, B symptoms, or infections      - Denies any chronic steroid use.   - She is a non-smoker  - Most recent labs (10/08/2021): WBC 14.2 with ANC 9.5 and eosinophils 0.7 - PLAN: Suspect intermittent reactive leukocytosis in the setting of obesity and high burden of chronic disease. - We will continue to monitor, but would consider additional work-up such as MPN testing if she has any significant increases in WBC.  4.  Microcytic anemia: - Previously known to be iron deficient. - Last received IV iron on 06/25/2017 and 07/16/2017. - Not currently on iron  supplements. - No bright red blood per rectum or melena - Most recent labs (10/08/2021): Hgb 13.1, ferritin 112, iron saturation 28% - PLAN: Continue iron rich diet.  We will recheck iron levels at next visit and consider starting back on oral iron supplementation at that time.  5. CKD stage IIIa - Renal biopsy (08/29/2021) consistent with kidney disease from diabetes and hypertension - Baseline creatinine 1.1-1.4 - PLAN: Continue follow-up with Dr.  Bhutani  6.  Elevated liver enzymes:  - Ultrasound abdomen on 04/25/2018 showed fatty liver. - She has chronically elevated AST, ALT, and alkaline phosphatase - Liver biopsy (10/15/2020): Minimally active steatohepatitis, mild portal and centrilobular fibrosis - PLAN: Continue follow up with GI (NP Roseanne Kaufman)  PLAN SUMMARY & DISPOSITION:   Labs + Xray in 4 months RTC after labs   All questions were answered. The patient knows to call the clinic with any problems, questions or concerns.  Medical decision making: Moderate    Time spent on visit: I spent 20 minutes counseling the patient face to face. The total time spent in the appointment was 30 minutes and more than 50% was on counseling.   Harriett Rush, PA-C   10/16/21 3:32 PM

## 2021-10-16 ENCOUNTER — Inpatient Hospital Stay: Payer: Medicare Other

## 2021-10-16 ENCOUNTER — Inpatient Hospital Stay (HOSPITAL_BASED_OUTPATIENT_CLINIC_OR_DEPARTMENT_OTHER): Payer: Medicare Other | Admitting: Physician Assistant

## 2021-10-16 ENCOUNTER — Other Ambulatory Visit (HOSPITAL_COMMUNITY)
Admission: RE | Admit: 2021-10-16 | Discharge: 2021-10-16 | Disposition: A | Discharge status: Home / Self Care | Payer: Medicare Other | Source: Ambulatory Visit | Attending: Family Medicine | Admitting: Family Medicine

## 2021-10-16 VITALS — BP 137/75 | HR 77 | Temp 98.8°F | Resp 18 | Ht 68.0 in | Wt 234.8 lb

## 2021-10-16 DIAGNOSIS — D472 Monoclonal gammopathy: Secondary | ICD-10-CM | POA: Diagnosis not present

## 2021-10-16 DIAGNOSIS — E1122 Type 2 diabetes mellitus with diabetic chronic kidney disease: Secondary | ICD-10-CM | POA: Diagnosis present

## 2021-10-16 DIAGNOSIS — D509 Iron deficiency anemia, unspecified: Secondary | ICD-10-CM | POA: Diagnosis present

## 2021-10-16 DIAGNOSIS — D631 Anemia in chronic kidney disease: Secondary | ICD-10-CM

## 2021-10-16 DIAGNOSIS — D72825 Bandemia: Secondary | ICD-10-CM | POA: Diagnosis not present

## 2021-10-16 DIAGNOSIS — N183 Chronic kidney disease, stage 3 unspecified: Secondary | ICD-10-CM

## 2021-10-16 LAB — BASIC METABOLIC PANEL
Anion gap: 5 (ref 5–15)
BUN: 29 mg/dL — ABNORMAL HIGH (ref 8–23)
CO2: 24 mmol/L (ref 22–32)
Calcium: 8.6 mg/dL — ABNORMAL LOW (ref 8.9–10.3)
Chloride: 107 mmol/L (ref 98–111)
Creatinine, Ser: 1.55 mg/dL — ABNORMAL HIGH (ref 0.44–1.00)
GFR, Estimated: 36 mL/min — ABNORMAL LOW (ref >60)
Glucose, Bld: 157 mg/dL — ABNORMAL HIGH (ref 70–99)
Potassium: 4.1 mmol/L (ref 3.5–5.1)
Sodium: 136 mmol/L (ref 135–145)

## 2021-10-16 LAB — TSH: TSH: 1.824 u[IU]/mL (ref 0.350–4.500)

## 2021-10-16 LAB — LIPID PANEL
Cholesterol: 178 mg/dL (ref 0–200)
HDL: 67 mg/dL (ref >40)
LDL Cholesterol: 95 mg/dL (ref 0–99)
Total CHOL/HDL Ratio: 2.7 RATIO
Triglycerides: 79 mg/dL (ref <150)
VLDL: 16 mg/dL (ref 0–40)

## 2021-10-16 LAB — FERRITIN: Ferritin: 112 ng/mL (ref 11–307)

## 2021-10-16 NOTE — Patient Instructions (Signed)
[place] at [facility] Discharge Instructions  You were seen today by Tarri Abernethy PA-C for your MGUS (abnormal protein) and other blood problems.  MGUS:  As we discussed, your protein levels are slightly higher than they were before, although you do not show any other signs of progression to cancer.  We will check your labs again in 4 months.  We will also check whole-body Xrays in 4 months to look for any spots on your bones.  HISTORY OF BLOOD CLOTS: Continue to take Eliquis for your history of blood clots.  Seek immediate medical attention if you have any major bleeding events while taking Eliquis.  ELEVATED WHITE BLOOD CELLS: Your white blood cells are elevated again.  Your elevated white blood cells are likely related to underlying infection or inflammation.  If they continue to remain elevated at your next visit, we will check additional labs.  ANEMIA: Your blood and iron levels look good at today's visit.  You do not need any IV iron at this time.  We may need to restart you on iron pills at your next visit, but we can wait and see how your labs look.  Continue to eat iron rich vegetables and other iron rich foods in your diet.  LABS: Return in 4 months for repeat labs PLUS x-rays  FOLLOW-UP APPOINTMENT: Office visit in 4 months, after labs  ** Thank you for trusting me with your healthcare!  I strive to provide all of my patients with quality care at each visit.  If you receive a survey for this visit, I would be so grateful to you for taking the time to provide feedback.  Thank you in advance!  ~ Lateka Rady                   Dr. Derek Jack   &   Tarri Abernethy, PA-C   - - - - - - - - - - - - - - - - - -     Thank you for choosing [facility] to provide your oncology and hematology care.  To afford each patient quality time with our provider, please arrive at least 15 minutes before your scheduled  appointment time.   If you have a lab appointment with the [facility] please come in thru the Main Entrance and check in at the main information desk.  You need to re-schedule your appointment should you arrive 10 or more minutes late.  We strive to give you quality time with our providers, and arriving late affects you and other patients whose appointments are after yours.  Also, if you no show three or more times for appointments you may be dismissed from the clinic at the providers discretion.     Again, thank you for choosing [facility].  Our hope is that these requests will decrease the amount of time that you wait before being seen by our physicians.       _____________________________________________________________  Should you have questions after your visit to [facility], please contact our office at [phone] and follow the prompts.  Our office hours are 8:00 a.m. and 4:30 p.m. Monday - Friday.  Please note that voicemails left after 4:00 p.m. may not be returned until the following business day.  We are closed weekends and major holidays.  You do have access to a nurse 24-7, just call the main number to the clinic  [phone] and do not press any options, hold on the line and a nurse will answer the phone.    For prescription refill requests, have your pharmacy contact our office and allow 72 hours.    Due to Covid, you will need to wear a mask upon entering the hospital. If you do not have a mask, a mask will be given to you at the Main Entrance upon arrival. For doctor visits, patients may have 1 support person age 49 or older with them. For treatment visits, patients can not have anyone with them due to social distancing guidelines and our immunocompromised population.

## 2021-10-27 ENCOUNTER — Institutional Professional Consult (permissible substitution): Payer: Medicare Other | Admitting: Neurology

## 2021-10-30 ENCOUNTER — Other Ambulatory Visit: Payer: Self-pay

## 2021-10-30 MED ORDER — PANTOPRAZOLE SODIUM 40 MG PO TBEC: 40.0000 mg | DELAYED_RELEASE_TABLET | Freq: Every day | ORAL | 3 refills | Status: DC

## 2021-11-10 ENCOUNTER — Other Ambulatory Visit: Payer: Self-pay | Admitting: Nurse Practitioner

## 2021-11-13 ENCOUNTER — Ambulatory Visit: Payer: Medicare Other | Admitting: Nurse Practitioner

## 2021-11-20 ENCOUNTER — Encounter: Payer: Self-pay | Admitting: Nurse Practitioner

## 2021-11-20 ENCOUNTER — Telehealth: Payer: Self-pay | Admitting: "Endocrinology

## 2021-11-20 ENCOUNTER — Ambulatory Visit (INDEPENDENT_AMBULATORY_CARE_PROVIDER_SITE_OTHER): Payer: Medicare Other | Admitting: Nurse Practitioner

## 2021-11-20 VITALS — BP 150/81 | HR 87 | Ht 68.0 in | Wt 238.0 lb

## 2021-11-20 DIAGNOSIS — I10 Essential (primary) hypertension: Secondary | ICD-10-CM | POA: Diagnosis not present

## 2021-11-20 DIAGNOSIS — N1832 Chronic kidney disease, stage 3b: Secondary | ICD-10-CM | POA: Diagnosis not present

## 2021-11-20 DIAGNOSIS — Z794 Long term (current) use of insulin: Secondary | ICD-10-CM

## 2021-11-20 DIAGNOSIS — E782 Mixed hyperlipidemia: Secondary | ICD-10-CM | POA: Diagnosis not present

## 2021-11-20 DIAGNOSIS — E1122 Type 2 diabetes mellitus with diabetic chronic kidney disease: Secondary | ICD-10-CM | POA: Diagnosis not present

## 2021-11-20 MED ORDER — EMPAGLIFLOZIN 10 MG PO TABS: 10.0000 mg | ORAL_TABLET | Freq: Every day | ORAL | 3 refills | Status: DC

## 2021-11-20 MED ORDER — INSULIN DEGLUDEC 100 UNIT/ML ~~LOC~~ SOPN
25.0000 [IU] | PEN_INJECTOR | Freq: Every day | SUBCUTANEOUS | 3 refills | Status: DC
Start: 2021-11-20 — End: 2021-12-12

## 2021-11-20 MED ORDER — TIRZEPATIDE 5 MG/0.5ML ~~LOC~~ SOAJ: 5.0000 mg | SUBCUTANEOUS | 0 refills | Status: DC

## 2021-11-20 NOTE — Patient Instructions (Signed)
Diabetes Mellitus and Standards of [place] with and managing diabetes (diabetes mellitus) can be complicated. Your diabetes treatment may be managed by a team of health care providers, including: A physician who specializes in diabetes (endocrinologist). You might also have visits with a nurse practitioner or physician assistant. Nurses. A registered dietitian. A certified diabetes care and education specialist. An exercise specialist. A pharmacist. An eye doctor. A foot specialist (podiatrist). A dental care provider. A primary care provider. A mental health care provider. How to manage your diabetes You can do many things to successfully manage your diabetes. Your health care providers will follow guidelines to help you get the best quality of care. Here are general guidelines for your diabetes management plan. Your health care providers may give you more specific instructions. Physical exams When you are diagnosed with diabetes, and each year after that, your health care provider will ask about your medical and family history. You will have a physical exam, which may include: Measuring your height, weight, and body mass index (BMI). Checking your blood pressure. This will be done at every routine medical visit. Your target blood pressure may vary depending on your medical conditions, your age, and other factors. A thyroid exam. A skin exam. Screening for nerve damage (peripheral neuropathy). This may include checking the pulse in your legs and feet and the level of sensation in your hands and feet. A foot exam to inspect the structure and skin of your feet, including checking for cuts, bruises, redness, blisters, sores, or other problems. Screening for blood vessel (vascular) problems. This may include checking the pulse in your legs and feet and checking your temperature. Blood tests Depending on your treatment plan and your personal needs, you may have the following  tests: Hemoglobin A1C (HbA1C). This test provides information about blood sugar (glucose) control over the previous 2-3 months. It is used to adjust your treatment plan, if needed. This test will be done: At least 2 times a year, if you are meeting your treatment goals. 4 times a year, if you are not meeting your treatment goals or if your goals have changed. Lipid testing, including total cholesterol, LDL and HDL cholesterol, and triglyceride levels. The goal for LDL is less than 100 mg/dL (5.5 mmol/L). If you are at high risk for complications, the goal is less than 70 mg/dL (3.9 mmol/L). The goal for HDL is 40 mg/dL (2.2 mmol/L) or higher for men, and 50 mg/dL (2.8 mmol/L) or higher for women. An HDL cholesterol of 60 mg/dL (3.3 mmol/L) or higher gives some protection against heart disease. The goal for triglycerides is less than 150 mg/dL (8.3 mmol/L). Liver function tests. Kidney function tests. Thyroid function tests.  Dental and eye exams  Visit your dentist two times a year. If you have type 1 diabetes, your health care provider may recommend an eye exam within 5 years after you are diagnosed, and then once a year after your first exam. For children with type 1 diabetes, the health care provider may recommend an eye exam when your child is age 64 or older and has had diabetes for 3-5 years. After the first exam, your child should get an eye exam once a year. If you have type 2 diabetes, your health care provider may recommend an eye exam as soon as you are diagnosed, and then every 1-2 years after your first exam. Immunizations A yearly flu (influenza) vaccine is recommended annually for everyone 6 months or older. This is especially  important if you have diabetes. The pneumonia (pneumococcal) vaccine is recommended for everyone 2 years or older who has diabetes. If you are age 50 or older, you may get the pneumonia vaccine as a series of two separate shots. The hepatitis B vaccine is  recommended for adults shortly after being diagnosed with diabetes. Adults and children with diabetes should receive all other vaccines according to age-specific recommendations from the Centers for Disease Control and Prevention (CDC). Mental and emotional health Screening for symptoms of eating disorders, anxiety, and depression is recommended at the time of diagnosis and after as needed. If your screening shows that you have symptoms, you may need more evaluation. You may work with a mental health care provider. Follow these instructions at home: Treatment plan You will monitor your blood glucose levels and may give yourself insulin. Your treatment plan will be reviewed at every medical visit. You and your health care provider will discuss: How you are taking your medicines, including insulin. Any side effects you have. Your blood glucose level target goals. How often you monitor your blood glucose level. Lifestyle habits, such as activity level and tobacco, alcohol, and substance use. Education Your health care provider will assess how well you are monitoring your blood glucose levels and whether you are taking your insulin and medicines correctly. He or she may refer you to: A certified diabetes care and education specialist to manage your diabetes throughout your life, starting at diagnosis. A registered dietitian who can create and review your personal nutrition plan. An exercise specialist who can discuss your activity level and exercise plan. General instructions Take over-the-counter and prescription medicines only as told by your health care provider. Keep all follow-up visits. This is important. Where to find support There are many diabetes support networks, including: American Diabetes Association (ADA): diabetes.org Defeat Diabetes Foundation: defeatdiabetes.org Where to find more information American Diabetes Association (ADA): www.diabetes.org Association of Diabetes Care &  Education Specialists (ADCES): diabeteseducator.org International Diabetes Federation (IDF): https://www.munoz-bell.org/ Summary Managing diabetes (diabetes mellitus) can be complicated. Your diabetes treatment may be managed by a team of health care providers. Your health care providers follow guidelines to help you get the best quality care. You should have physical exams, blood tests, blood pressure monitoring, immunizations, and screening tests regularly. Stay updated on how to manage your diabetes. Your health care providers may also give you more specific instructions based on your individual health. This information is not intended to replace advice given to you by your health care provider. Make sure you discuss any questions you have with your health care provider. Document Revised: 08/10/2019 Document Reviewed: 08/10/2019 [employer] Patient Education  [employer].

## 2021-11-20 NOTE — Progress Notes (Signed)
Endocrinology Follow Up Note       11/20/2021, 3:59 PM   Subjective:    [name], female    DOB: 10/03/1950.  Stephanie [name] is being seen in follow up after being seen in consultation for management of currently uncontrolled symptomatic diabetes requested by  [name], [place].   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   Type 2 diabetes mellitus ([place])     Past Surgical History:  Procedure Laterality Date   ABDOMINAL HYSTERECTOMY  partial   CARPAL TUNNEL RELEASE Right 1991   CATARACT EXTRACTION W/PHACO Right 05/08/2013   Procedure: CATARACT EXTRACTION PHACO AND INTRAOCULAR LENS PLACEMENT ([place]);  Surgeon: Tonny Branch, MD;  Location: [facility];  Service: Ophthalmology;  Laterality: Right;  CDE 10.31   CATARACT EXTRACTION W/PHACO Left 08/17/2013   Procedure: CATARACT EXTRACTION PHACO AND INTRAOCULAR LENS PLACEMENT ([facility]);  Surgeon: Tonny Branch, MD;  Location: [facility];  Service: Ophthalmology;  Laterality: Left;  CDE:9.03   CHOLECYSTECTOMY  1971   COLONOSCOPY WITH PROPOFOL N/A 01/06/2016   Dr. Gala Romney: diverticulosis    DENTAL SURGERY     ESOPHAGEAL BRUSHING  08/29/2019   Procedure: ESOPHAGEAL BRUSHING;  Surgeon: Daneil Dolin, MD;  Location: [facility];  Service: Endoscopy;;   ESOPHAGOGASTRODUODENOSCOPY (EGD) WITH PROPOFOL N/A 01/06/2016   Dr. Gala Romney: normal s/p empiric dilation    ESOPHAGOGASTRODUODENOSCOPY (EGD) WITH PROPOFOL N/A 08/29/2019    esophageal plaques vs medication residue adherent to tubular esophagus s/p KOH brushing and dilation. Medium-sized hiatal hernia. + for candida. Diflucan.    EYE SURGERY     MALONEY DILATION N/A 01/06/2016   Procedure: Venia Minks DILATION;  Surgeon: Daneil Dolin, MD;  Location: [facility];  Service: Endoscopy;  Laterality: N/A;   MALONEY DILATION N/A 08/29/2019   Procedure: Venia Minks DILATION;  Surgeon: Daneil Dolin, MD;  Location: [facility];  Service: Endoscopy;  Laterality: N/A;   REVERSE SHOULDER ARTHROPLASTY Right 08/06/2017   Procedure: RIGHT REVERSE SHOULDER ARTHROPLASTY;  Surgeon: Netta Cedars, MD;  Location: [facility];  Service: Orthopedics;  Laterality: Right;   WRIST GANGLION EXCISION Left     Social History   Socioeconomic History   Marital status: Married    Spouse name: Not on file   Number of children: Not on file   Years of education: Not on file   Highest education level: Not on file  Occupational History   Not on file  Tobacco Use   Smoking status: Former    Packs/day: 0.25    Years: 30.00    Total pack years: 7.50    Types: Cigarettes    Quit  date: 02/17/2011    Years since quitting: 10.7   Smokeless tobacco: Never  Vaping Use   Vaping Use: Never used  Substance and Sexual Activity   Alcohol use: Not Currently    Comment: rare   Drug use: No   Sexual activity: Not Currently    Birth control/protection: Surgical  Other Topics Concern   Not on file  Social History Narrative   Not on file   Social Determinants of Health   Financial Resource Strain: Not on file  Food Insecurity: Not on file  Transportation Needs: Not on file  Physical Activity: Not on file  Stress: Not on file  Social Connections: Not on file    Family History  Problem Relation Age of Onset   Hypertension Mother    Diabetes Mother    COPD Mother    Arthritis Mother    Diabetes Father    Arthritis Father    Dementia Father    CAD Father    Hypothyroidism Sister    Diabetes  Brother    Colon cancer Niece    Colon polyps Neg Hx     Outpatient Encounter Medications as of 11/20/2021  Medication Sig   Accu-Chek FastClix Lancets MISC Apply topically.   ACCU-CHEK GUIDE test strip 4 (four) times daily.   acetaminophen (TYLENOL) 500 MG tablet Take 1,000 mg by mouth every 6 (six) hours as needed for moderate pain or headache.   albuterol (PROVENTIL) (2.5 MG/3ML) 0.083% nebulizer solution Take 2.5 mg by nebulization every 6 (six) hours as needed for wheezing or shortness of breath.   albuterol (VENTOLIN HFA) 108 (90 Base) MCG/ACT inhaler Inhale 1-2 puffs into the lungs every 6 (six) hours as needed for wheezing or shortness of breath.   Alcohol Swabs (ALCOHOL PADS) 70 % PADS SMARTSIG:Pledget(s) Topical 4 Times Daily   apixaban (ELIQUIS) 5 MG TABS tablet Take 1 tablet (5 mg total) by mouth 2 (two) times daily.   ARNUITY ELLIPTA 100 MCG/ACT AEPB Inhale 1 puff into the lungs at bedtime.    Ascorbic Acid (VITAMIN C) 1000 MG tablet Take 500 mg by mouth daily.   atorvastatin (LIPITOR) 80 MG tablet Take 1 tablet (80 mg total) by mouth daily.   bisoprolol (ZEBETA) 10 MG tablet Take 1 tablet (10 mg total) by mouth in the morning and at bedtime.   Blood Glucose Calibration (ACCU-CHEK GUIDE CONTROL) LIQD See admin instructions.   Blood Glucose Monitoring Suppl (ACCU-CHEK GUIDE ME) w/Device KIT 4 (four) times daily.   Cetirizine HCl 10 MG CAPS Take 10 mg by mouth daily.   Cholecalciferol (VITAMIN D) 2000 units tablet Take 4,000 Units by mouth daily.    cimetidine (TAGAMET) 200 MG tablet Take 0.5 tablets (100 mg total) by mouth daily as needed. (Patient taking differently: Take 100 mg by mouth daily as needed (indigestion).)   CINNAMON PO Take 1-2 capsules by mouth 3 (three) times daily.   clobetasol ointment (TEMOVATE) 7.54 % Apply 1 application topically 3 (three) times daily as needed (imflammation).   diphenhydrAMINE (BENADRYL) 25 mg capsule Take 50 mg by mouth every 4 (four) hours  as needed for itching.   DULoxetine (CYMBALTA) 30 MG capsule Take 1 capsule (30 mg total) by mouth 2 (two) times daily.   EASY COMFORT PEN NEEDLES 31G X 5 MM MISC INJECT IN SULIN EVERY DAY AS DIRECTED   Evolocumab (REPATHA SURECLICK) 492 MG/ML SOAJ Inject 140 mg into the skin every 14 (fourteen) days.   fluticasone (VERAMYST) 27.5 MCG/SPRAY nasal  spray Place 2 sprays into the nose daily.   guaiFENesin (ROBITUSSIN) 100 MG/5ML SOLN Take 5 mLs (100 mg total) by mouth every 4 (four) hours as needed for cough or to loosen phlegm.   HUMALOG KWIKPEN 100 UNIT/ML KiwkPen Inject 8-16 Units into the skin 3 (three) times daily. Sliding Scale   hydrOXYzine (ATARAX/VISTARIL) 25 MG tablet Take 25 mg by mouth every 8 (eight) hours as needed for itching.    insulin degludec (TRESIBA) 100 UNIT/ML FlexTouch Pen Inject 25 Units into the skin at bedtime.   ipratropium-albuterol (DUONEB) 0.5-2.5 (3) MG/3ML SOLN Take 3 mLs by nebulization every 6 (six) hours as needed (shortness of breath).   Lancet Devices (EASY MINI EJECT LANCING DEVICE) MISC 4 (four) times daily.   levothyroxine (SYNTHROID) 88 MCG tablet Take 88 mcg by mouth daily before breakfast.   lubiprostone (AMITIZA) 8 MCG capsule TAKE 1 CAPSULE(8 MCG) BY MOUTH TWICE DAILY WITH A MEAL (Patient taking differently: Take 8 mcg by mouth daily as needed (IBS). With meals)   LYRICA 75 MG capsule Take 75 mg by mouth 2 (two) times daily.    methocarbamol (ROBAXIN) 500 MG tablet Take 1 tablet (500 mg total) by mouth 3 (three) times daily as needed. (Patient taking differently: Take 500 mg by mouth 3 (three) times daily as needed for muscle spasms.)   montelukast (SINGULAIR) 10 MG tablet Take 10 mg by mouth at bedtime.   omega-3 acid ethyl esters (LOVAZA) 1 g capsule Take 2 capsules (2 g total) by mouth 2 (two) times daily.   pantoprazole (PROTONIX) 40 MG tablet Take 1 tablet (40 mg total) by mouth daily.   potassium chloride (KLOR-CON) 10 MEQ tablet Take 10 mEq by mouth  daily.   RESTASIS 0.05 % ophthalmic emulsion Place 1 drop into both eyes 2 (two) times daily.   tirzepatide [facility]) 5 MG/0.5ML Pen Inject 5 mg into the skin once a week.   torsemide (DEMADEX) 20 MG tablet Take 2 tablets (40 mg total) by mouth daily.   traMADol (ULTRAM) 50 MG tablet Take 50 mg by mouth 3 (three) times daily as needed for moderate pain.   traZODone (DESYREL) 50 MG tablet Take 50-100 mg by mouth at bedtime as needed for sleep.   vitamin B-12 (CYANOCOBALAMIN) 1000 MCG tablet Take 1,000 mcg by mouth daily.   [DISCONTINUED] JARDIANCE 10 MG TABS tablet TAKE 1 TABLET BY MOUTH EVERY MORNING FOR DIABETES AND KIDNEYS   [DISCONTINUED] XULTOPHY 100-3.6 UNIT-MG/ML SOPN Inject 85 Units as directed at bedtime. (Patient taking differently: Inject 40 Units as directed every morning.)   empagliflozin (JARDIANCE) 10 MG TABS tablet Take 1 tablet (10 mg total) by mouth daily.   No facility-administered encounter medications on file as of 11/20/2021.    ALLERGIES: Allergies  Allergen Reactions   Tetracyclines & Related Anaphylaxis and Rash   Banana Hives and Nausea And Vomiting   Penicillins Rash and Other (See Comments)    Has patient had a PCN reaction causing immediate rash, facial/tongue/throat swelling, SOB or lightheadedness with hypotension: No Has patient had a PCN reaction causing severe rash involving mucus membranes or skin necrosis: No Has patient had a PCN reaction that required hospitalization No Has patient had a PCN reaction occurring within the last 10 years: No If all of the above answers are "NO", then may proceed with Cephalosporin use.     VACCINATION STATUS: Immunization History  Administered Date(s) Administered   Fluad Quad(high Dose 65+) 11/08/2018, 12/12/2020   Influenza,inj,Quad PF,6+ Mos 12/24/2015  Influenza-Unspecified 12/02/2016   Pneumococcal Polysaccharide-23 10/22/2013    Diabetes She presents for her follow-up diabetic visit. She has type 2  diabetes mellitus. Onset time: Diagnosed at approx age of 52. Her disease course has been improving. There are no hypoglycemic associated symptoms. Associated symptoms include fatigue, polydipsia and polyuria. There are no hypoglycemic complications. Symptoms are improving. Diabetic complications include a CVA, heart disease and nephropathy. Risk factors for coronary artery disease include diabetes mellitus, dyslipidemia, family history, hypertension, post-menopausal and sedentary lifestyle. Current diabetic treatment includes intensive insulin program and oral agent (monotherapy) (Xultophy 40 units nightly, Humalog TID and Jardiance 10 mg po daily). She is compliant with treatment most of the time. Her weight is increasing steadily. She is following a generally healthy diet. When asked about meal planning, she reported none. She has not had a previous visit with a dietitian. She rarely participates in exercise. Her home blood glucose trend is decreasing steadily. Her overall blood glucose range is 180-200 mg/dl. (She presents today with her CGM showing improved glycemic profile, yet still above target.  She was not due for another A1c today.  Analysis of her CGM shows TIR 45%, TAR 55%, TBR 0% with a GMI of 7.8%.  She notes she has only been taking Jardiance 10 mg po daily and is nearly out of her Xultophy. ) An ACE inhibitor/angiotensin II receptor blocker is not being taken. She sees a podiatrist.Eye exam is current.     Review of systems  Constitutional: + steadily increasing body weight, current Body mass index is 36.19 kg/m., no fatigue, no subjective hyperthermia, no subjective hypothermia Eyes: no blurry vision, no xerophthalmia ENT: no sore throat, no nodules palpated in throat, no dysphagia/odynophagia, no hoarseness Cardiovascular: no chest pain, no shortness of breath, no palpitations, no leg swelling Respiratory: no cough, no shortness of breath Gastrointestinal: no  nausea/vomiting/diarrhea Musculoskeletal: no muscle/joint aches, walks with cane Skin: no rashes, no hyperemia Neurological: no tremors, no numbness, no tingling, no dizziness Psychiatric: no depression, no anxiety  Objective:     BP (!) 150/81 (BP Location: Right Arm, Patient Position: Sitting, Cuff Size: Large)   Pulse 87   Ht 5' 8"  (1.727 m)   Wt 238 lb (108 kg)   BMI 36.19 kg/m   Wt Readings from Last 3 Encounters:  11/20/21 238 lb (108 kg)  10/16/21 234 lb 12.6 oz (106.5 kg)  09/25/21 227 lb (103 kg)     BP Readings from Last 3 Encounters:  11/20/21 (!) 150/81  10/16/21 137/75  10/06/21 134/84     Physical Exam- Limited  Constitutional:  Body mass index is 36.19 kg/m. , not in acute distress, normal state of mind Eyes:  EOMI, no exophthalmos Neck: Supple Cardiovascular: RRR, no murmurs, rubs, or gallops, no edema Respiratory: Adequate breathing efforts, no crackles, rales, rhonchi, or wheezing Musculoskeletal: no gross deformities, strength intact in all four extremities, no gross restriction of joint movements, walks with cane Skin:  no rashes, no hyperemia Neurological: no tremor with outstretched hands   Diabetic Foot Exam - Simple   No data filed      CMP ( most recent) CMP     Component Value Date/Time   NA 136 10/16/2021 1340   K 4.1 10/16/2021 1340   CL 107 10/16/2021 1340   CO2 24 10/16/2021 1340   GLUCOSE 157 (H) 10/16/2021 1340   BUN 29 (H) 10/16/2021 1340   CREATININE 1.55 (H) 10/16/2021 1340   CALCIUM 8.6 (L) 10/16/2021 1340  CALCIUM 8.9 08/04/2017 1032   PROT 8.7 (H) 10/08/2021 1236   PROT 8.1 12/16/2015 1625   ALBUMIN 2.9 (L) 10/08/2021 1236   ALBUMIN 3.7 12/16/2015 1625   AST 69 (H) 10/08/2021 1236   ALT 94 (H) 10/08/2021 1236   ALKPHOS 399 (H) 10/08/2021 1236   BILITOT 0.6 10/08/2021 1236   BILITOT 0.2 12/16/2015 1625   GFRNONAA 36 (L) 10/16/2021 1340   GFRAA 54 (L) 09/05/2019 1021     Diabetic Labs (most recent): Lab  Results  Component Value Date   HGBA1C 8.8 (H) 09/09/2021   HGBA1C 8.8 09/09/2021   HGBA1C 9.2 (H) 07/24/2021   MICROALBUR 5,373.1 (H) 04/03/2021   MICROALBUR 598.9 (H) 12/04/2019     Lipid Panel ( most recent) Lipid Panel     Component Value Date/Time   CHOL 178 10/16/2021 1340   CHOL 210 (H) 09/09/2021 1141   TRIG 79 10/16/2021 1340   HDL 67 10/16/2021 1340   HDL 63 09/09/2021 1141   CHOLHDL 2.7 10/16/2021 1340   VLDL 16 10/16/2021 1340   LDLCALC 95 10/16/2021 1340   LDLCALC 125 (H) 09/09/2021 1141   LABVLDL 22 09/09/2021 1141      Lab Results  Component Value Date   TSH 1.824 10/16/2021   TSH 2.213 07/24/2021   TSH 3.483 04/03/2021   TSH 2.253 11/28/2020   TSH 0.660 09/30/2020   TSH 0.559 06/28/2020   TSH 2.715 12/04/2019   TSH 1.267 06/05/2019   TSH 0.133 (L) 03/06/2019   TSH 0.939 12/06/2018           Assessment & Plan:   1) Type 2 diabetes mellitus with stage 3a chronic kidney disease, with long-term current use of insulin ([place])  She presents today with her CGM showing improved glycemic profile, yet still above target.  She was not due for another A1c today.  Analysis of her CGM shows TIR 45%, TAR 55%, TBR 0% with a GMI of 7.8%.  She notes she has only been taking Jardiance 10 mg po daily and is nearly out of her Xultophy.   Stephanie [name] has currently uncontrolled symptomatic type 2 DM since 71 years of age.   -Recent labs reviewed.  - I had a long discussion with her about the progressive nature of diabetes and the pathology behind its complications. -her diabetes is complicated by CHF, CKD stage 3, CVA x 5 and she remains at a high risk for more acute and chronic complications which include CAD, CVA, CKD, retinopathy, and neuropathy. These are all discussed in detail with her.  The following Lifestyle Medicine recommendations according to [facility]) were discussed and offered to patient and she agrees to start the  journey:  A. Whole Foods, Plant-based plate comprising of fruits and vegetables, plant-based proteins, whole-grain carbohydrates was discussed in detail with the patient.   A list for source of those nutrients were also provided to the patient.  Patient will use only water or unsweetened tea for hydration. B.  The need to stay away from risky substances including alcohol, smoking; obtaining 7 to 9 hours of restorative sleep, at least 150 minutes of moderate intensity exercise weekly, the importance of healthy social connections,  and stress reduction techniques were discussed. C.  A full color page of  Calorie density of various food groups per pound showing examples of each food groups was provided to the patient.  - Nutritional counseling repeated at each appointment due to patients tendency  to fall back in to old habits.  - The patient admits there is a room for improvement in their diet and drink choices. -  Suggestion is made for the patient to avoid simple carbohydrates from their diet including Cakes, Sweet Desserts / Pastries, Ice Cream, Soda (diet and regular), Sweet Tea, Candies, Chips, Cookies, Sweet Pastries, Store Bought Juices, Alcohol in Excess of 1-2 drinks a day, Artificial Sweeteners, Coffee Creamer, and "Sugar-free" Products. This will help patient to have stable blood glucose profile and potentially avoid unintended weight gain.   - I encouraged the patient to switch to unprocessed or minimally processed complex starch and increased protein intake (animal or plant source), fruits, and vegetables.   - Patient is advised to stick to a routine mealtimes to eat 3 meals a day and avoid unnecessary snacks (to snack only to correct hypoglycemia).  - I have approached her with the following individualized plan to manage her diabetes and patient agrees:   -Since she is close to running out of her Xultophy, will switch to GLP1 and long acting insulin separately to allow for more  individualized adjustments.  I discussed and initiated Tresiba 26 units SQ nightly (to start tomorrow since she took her Xultophy this morning) and Mounjaro 5 mg SQ weekly.  She can continue her Humalog 8-14 units TID with meals if glucose is above 90 and she is eating (Specific instructions on how to titrate insulin dosage based on glucose readings given to patient in writing) and Jardiance 10 mg po daily.   -she is encouraged to continue monitoring glucose 4 times daily (using her CGM), before meals and before bed, and to call the clinic if she has readings less than 70 or above 300 for 3 tests in a row.  - she is warned not to take insulin without proper monitoring per orders. - Adjustment parameters are given to her for hypo and hyperglycemia in writing.  - she is not a candidate for Metformin due to concurrent renal insufficiency.  - Specific targets for  A1c; LDL, HDL, and Triglycerides were discussed with the patient.  2) Blood Pressure /Hypertension:  her blood pressure is controlled to target for her age.   she is advised to continue her current medications including Bisoprolol 10 mg p.o. daily with breakfast.  3) Lipids/Hyperlipidemia:    Review of her recent lipid panel from 07/24/21 showed uncontrolled LDL at 119 .  she is advised to continue Lipitor 80 mg daily at bedtime.  Side effects and precautions discussed with her.  4)  Weight/Diet:  her Body mass index is 36.19 kg/m.  -  clearly complicating her diabetes care.   she is a candidate for weight loss. I discussed with her the fact that loss of 5 - 10% of her  current body weight will have the most impact on her diabetes management.  Exercise, and detailed carbohydrates information provided  -  detailed on discharge instructions.  5) Chronic Care/Health Maintenance: -she is not on ACEI/ARB and is on Statin medications and is encouraged to initiate and continue to follow up with Ophthalmology, Dentist, Podiatrist at least yearly or  according to recommendations, and advised to stay away from smoking. I have recommended yearly flu vaccine and pneumonia vaccine at least every 5 years; moderate intensity exercise for up to 150 minutes weekly; and sleep for at least 7 hours a day.  - she is advised to maintain close follow up with [name], FNP for primary care needs, as well  as her other providers for optimal and coordinated care.      I spent 30 minutes in the care of the patient today including review of labs from [place], Lipids, Thyroid Function, Hematology (current and previous including abstractions from other facilities); face-to-face time discussing  her blood glucose readings/logs, discussing hypoglycemia and hyperglycemia episodes and symptoms, medications doses, her options of short and long term treatment based on the latest standards of care / guidelines;  discussion about incorporating lifestyle medicine;  and documenting the encounter. Risk reduction counseling performed per USPSTF guidelines to reduce obesity and cardiovascular risk factors.     Please refer to Patient Instructions for Blood Glucose Monitoring and Insulin/Medications Dosing Guide"  in media tab for additional information. Please  also refer to " Patient Self Inventory" in the Media  tab for reviewed elements of pertinent patient history.  Stephanie [name] participated in the discussions, expressed understanding, and voiced agreement with the above plans.  All questions were answered to her satisfaction. she is encouraged to contact clinic should she have any questions or concerns prior to her return visit.     Follow up plan: - Return in about 1 month (around 12/21/2021) for Diabetes F/U, Bring meter and logs.   Rayetta Pigg, [facility] [address] [place], [place] [address] Phone: [phone] Fax: [phone]  11/20/2021, 3:59 PM

## 2021-11-25 DIAGNOSIS — I130 Hypertensive heart and chronic kidney disease with heart failure and stage 1 through stage 4 chronic kidney disease, or unspecified chronic kidney disease: Secondary | ICD-10-CM | POA: Insufficient documentation

## 2021-11-25 DIAGNOSIS — D631 Anemia in chronic kidney disease: Secondary | ICD-10-CM | POA: Insufficient documentation

## 2021-11-25 DIAGNOSIS — N189 Chronic kidney disease, unspecified: Secondary | ICD-10-CM | POA: Insufficient documentation

## 2021-11-25 DIAGNOSIS — E785 Hyperlipidemia, unspecified: Secondary | ICD-10-CM | POA: Insufficient documentation

## 2021-12-08 ENCOUNTER — Telehealth: Payer: Self-pay | Admitting: Nurse Practitioner

## 2021-12-08 NOTE — Telephone Encounter (Signed)
Patient called and made aware that she can have another sample Pen Tyler Aas) She states that her daughter would be the one to come by and pick it up. I ask her to please sent to [place] at least three days of her BS readings, so that we can adjust the units of Tresiba if needed,, she said that she would , but that there will be some high ones as her grandson is still barely hanging on.  Just FYI - patient may be calling in to give readings over the phone.

## 2021-12-08 NOTE — Telephone Encounter (Signed)
New message  A sample medication Stephanie [name]  was given at last office visit.    The patient out of medication

## 2021-12-08 NOTE — Telephone Encounter (Signed)
She can come by and pick up another sample pen.  Was she not able to pick up the prescription at the pharmacy on file?  How are her glucose readings?

## 2021-12-12 ENCOUNTER — Other Ambulatory Visit: Payer: Self-pay

## 2021-12-12 ENCOUNTER — Other Ambulatory Visit: Payer: Self-pay | Admitting: Student

## 2021-12-22 ENCOUNTER — Ambulatory Visit (INDEPENDENT_AMBULATORY_CARE_PROVIDER_SITE_OTHER): Payer: Medicare Other | Admitting: Nurse Practitioner

## 2021-12-22 ENCOUNTER — Encounter: Payer: Self-pay | Admitting: Nurse Practitioner

## 2021-12-22 VITALS — BP 144/75 | HR 91 | Ht 68.0 in | Wt 236.4 lb

## 2021-12-22 DIAGNOSIS — Z794 Long term (current) use of insulin: Secondary | ICD-10-CM

## 2021-12-22 DIAGNOSIS — I10 Essential (primary) hypertension: Secondary | ICD-10-CM

## 2021-12-22 DIAGNOSIS — E1122 Type 2 diabetes mellitus with diabetic chronic kidney disease: Secondary | ICD-10-CM | POA: Diagnosis not present

## 2021-12-22 DIAGNOSIS — E782 Mixed hyperlipidemia: Secondary | ICD-10-CM | POA: Diagnosis not present

## 2021-12-22 DIAGNOSIS — N1832 Chronic kidney disease, stage 3b: Secondary | ICD-10-CM | POA: Diagnosis not present

## 2021-12-22 LAB — POCT GLYCOSYLATED HEMOGLOBIN (HGB A1C): Hemoglobin A1C: 8.5 % — AB (ref 4.0–5.6)

## 2021-12-22 MED ORDER — TRESIBA FLEXTOUCH 100 UNIT/ML ~~LOC~~ SOPN
20.0000 [IU] | PEN_INJECTOR | Freq: Every day | SUBCUTANEOUS | 3 refills | Status: DC
Start: 2021-12-22 — End: 2022-09-29

## 2021-12-22 MED ORDER — HUMALOG KWIKPEN 100 UNIT/ML ~~LOC~~ SOPN: 5.0000 [IU] | PEN_INJECTOR | Freq: Three times a day (TID) | SUBCUTANEOUS | 3 refills | Status: DC

## 2021-12-22 MED ORDER — TIRZEPATIDE 7.5 MG/0.5ML ~~LOC~~ SOAJ: 7.5000 mg | SUBCUTANEOUS | 3 refills | Status: DC

## 2021-12-22 NOTE — Telephone Encounter (Signed)
Opened in error

## 2021-12-22 NOTE — Progress Notes (Signed)
Endocrinology Follow Up Note       12/22/2021, 4:30 PM   Subjective:    [name], female    DOB: 08/19/1950.  Stephanie [name] is being seen in follow up after being seen in consultation for management of currently uncontrolled symptomatic diabetes requested by  [name], [place].   Past Medical History:  Diagnosis Date   Anemia    Arthritis    Asthma    COPD (chronic obstructive pulmonary disease) ([place])    COVID-19    Deep vein thrombosis (DVT) of both lower extremities ([place]) 06/27/2015   Fibromyalgia    GERD (gastroesophageal reflux disease)    H/O hiatal hernia    Hypercholesteremia    Hypertension    Hyperthyroidism    IBS (irritable bowel syndrome)    Inappropriate sinus tachycardia    Inner ear disease    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   Type 2 diabetes mellitus ([place])     Past Surgical History:  Procedure Laterality Date   ABDOMINAL HYSTERECTOMY  partial   CARPAL TUNNEL RELEASE Right 1991   CATARACT EXTRACTION W/PHACO Right 05/08/2013   Procedure: CATARACT EXTRACTION PHACO AND INTRAOCULAR LENS PLACEMENT ([place]);  Surgeon: Tonny Branch, MD;  Location: [facility];  Service: Ophthalmology;  Laterality: Right;  CDE 10.31   CATARACT EXTRACTION W/PHACO Left 08/17/2013   Procedure: CATARACT EXTRACTION PHACO AND INTRAOCULAR LENS PLACEMENT ([facility]);  Surgeon: Tonny Branch, MD;  Location: [facility];  Service: Ophthalmology;  Laterality: Left;  CDE:9.03   CHOLECYSTECTOMY  1971   COLONOSCOPY WITH PROPOFOL N/A 01/06/2016   Dr. Gala Romney: diverticulosis    DENTAL SURGERY     ESOPHAGEAL BRUSHING  08/29/2019   Procedure: ESOPHAGEAL BRUSHING;  Surgeon: Daneil Dolin, MD;  Location: [facility];  Service: Endoscopy;;   ESOPHAGOGASTRODUODENOSCOPY (EGD) WITH PROPOFOL N/A 01/06/2016   Dr. Gala Romney: normal s/p empiric dilation    ESOPHAGOGASTRODUODENOSCOPY (EGD) WITH PROPOFOL N/A 08/29/2019    esophageal plaques vs medication residue adherent to tubular esophagus s/p KOH brushing and dilation. Medium-sized hiatal hernia. + for candida. Diflucan.    EYE SURGERY     MALONEY DILATION N/A 01/06/2016   Procedure: Venia Minks DILATION;  Surgeon: Daneil Dolin, MD;  Location: [facility];  Service: Endoscopy;  Laterality: N/A;   MALONEY DILATION N/A 08/29/2019   Procedure: Venia Minks DILATION;  Surgeon: Daneil Dolin, MD;  Location: [facility];  Service: Endoscopy;  Laterality: N/A;   REVERSE SHOULDER ARTHROPLASTY Right 08/06/2017   Procedure: RIGHT REVERSE SHOULDER ARTHROPLASTY;  Surgeon: Netta Cedars, MD;  Location: [facility];  Service: Orthopedics;  Laterality: Right;   WRIST GANGLION EXCISION Left     Social History   Socioeconomic History   Marital status: Married    Spouse name: Not on file   Number of children: Not on file   Years of education: Not on file   Highest education level: Not on file  Occupational History   Not on file  Tobacco Use   Smoking status: Former    Packs/day: 0.25    Years: 30.00    Total pack years: 7.50    Types: Cigarettes    Quit  date: 02/17/2011    Years since quitting: 10.8   Smokeless tobacco: Never  Vaping Use   Vaping Use: Never used  Substance and Sexual Activity   Alcohol use: Not Currently    Comment: rare   Drug use: No   Sexual activity: Not Currently    Birth control/protection: Surgical  Other Topics Concern   Not on file  Social History Narrative   Not on file   Social Determinants of Health   Financial Resource Strain: Not on file  Food Insecurity: Not on file  Transportation Needs: Not on file  Physical Activity: Not on file  Stress: Not on file  Social Connections: Not on file    Family History  Problem Relation Age of Onset   Hypertension Mother    Diabetes Mother    COPD Mother    Arthritis Mother    Diabetes Father    Arthritis Father    Dementia Father    CAD Father    Hypothyroidism Sister    Diabetes  Brother    Colon cancer Niece    Colon polyps Neg Hx     Outpatient Encounter Medications as of 12/22/2021  Medication Sig   Accu-Chek FastClix Lancets MISC Apply topically.   ACCU-CHEK GUIDE test strip 4 (four) times daily.   acetaminophen (TYLENOL) 500 MG tablet Take 1,000 mg by mouth every 6 (six) hours as needed for moderate pain or headache.   albuterol (PROVENTIL) (2.5 MG/3ML) 0.083% nebulizer solution Take 2.5 mg by nebulization every 6 (six) hours as needed for wheezing or shortness of breath.   albuterol (VENTOLIN HFA) 108 (90 Base) MCG/ACT inhaler Inhale 1-2 puffs into the lungs every 6 (six) hours as needed for wheezing or shortness of breath.   Alcohol Swabs (ALCOHOL PADS) 70 % PADS SMARTSIG:Pledget(s) Topical 4 Times Daily   apixaban (ELIQUIS) 5 MG TABS tablet Take 1 tablet (5 mg total) by mouth 2 (two) times daily.   ARNUITY ELLIPTA 100 MCG/ACT AEPB Inhale 1 puff into the lungs at bedtime.    Ascorbic Acid (VITAMIN C) 1000 MG tablet Take 500 mg by mouth daily.   atorvastatin (LIPITOR) 80 MG tablet Take 1 tablet (80 mg total) by mouth daily.   bisoprolol (ZEBETA) 10 MG tablet TAKE 1 TABLET(10 MG) BY MOUTH IN THE MORNING AND AT BEDTIME   Blood Glucose Calibration (ACCU-CHEK GUIDE CONTROL) LIQD See admin instructions.   Blood Glucose Monitoring Suppl (ACCU-CHEK GUIDE ME) w/Device KIT 4 (four) times daily.   Cetirizine HCl 10 MG CAPS Take 10 mg by mouth daily.   Cholecalciferol (VITAMIN D) 2000 units tablet Take 4,000 Units by mouth daily.    cimetidine (TAGAMET) 200 MG tablet Take 0.5 tablets (100 mg total) by mouth daily as needed. (Patient taking differently: Take 100 mg by mouth daily as needed (indigestion).)   CINNAMON PO Take 1-2 capsules by mouth 3 (three) times daily.   clobetasol ointment (TEMOVATE) 4.19 % Apply 1 application topically 3 (three) times daily as needed (imflammation).   diphenhydrAMINE (BENADRYL) 25 mg capsule Take 50 mg by mouth every 4 (four) hours as  needed for itching.   DULoxetine (CYMBALTA) 30 MG capsule Take 1 capsule (30 mg total) by mouth 2 (two) times daily.   EASY COMFORT PEN NEEDLES 31G X 5 MM MISC INJECT IN SULIN EVERY DAY AS DIRECTED   empagliflozin (JARDIANCE) 10 MG TABS tablet Take 1 tablet (10 mg total) by mouth daily.   Evolocumab (REPATHA SURECLICK) 622 MG/ML SOAJ Inject 140  mg into the skin every 14 (fourteen) days.   fluticasone (VERAMYST) 27.5 MCG/SPRAY nasal spray Place 2 sprays into the nose daily.   guaiFENesin (ROBITUSSIN) 100 MG/5ML SOLN Take 5 mLs (100 mg total) by mouth every 4 (four) hours as needed for cough or to loosen phlegm.   hydrOXYzine (ATARAX/VISTARIL) 25 MG tablet Take 25 mg by mouth every 8 (eight) hours as needed for itching.    insulin degludec (TRESIBA FLEXTOUCH) 100 UNIT/ML FlexTouch Pen Inject 20 Units into the skin at bedtime.   ipratropium-albuterol (DUONEB) 0.5-2.5 (3) MG/3ML SOLN Take 3 mLs by nebulization every 6 (six) hours as needed (shortness of breath).   Lancet Devices (EASY MINI EJECT LANCING DEVICE) MISC 4 (four) times daily.   levothyroxine (SYNTHROID) 88 MCG tablet Take 88 mcg by mouth daily before breakfast.   lubiprostone (AMITIZA) 8 MCG capsule TAKE 1 CAPSULE(8 MCG) BY MOUTH TWICE DAILY WITH A MEAL (Patient taking differently: Take 8 mcg by mouth daily as needed (IBS). With meals)   LYRICA 75 MG capsule Take 75 mg by mouth 2 (two) times daily.    methocarbamol (ROBAXIN) 500 MG tablet Take 1 tablet (500 mg total) by mouth 3 (three) times daily as needed. (Patient taking differently: Take 500 mg by mouth 3 (three) times daily as needed for muscle spasms.)   montelukast (SINGULAIR) 10 MG tablet Take 10 mg by mouth at bedtime.   omega-3 acid ethyl esters (LOVAZA) 1 g capsule Take 2 capsules (2 g total) by mouth 2 (two) times daily.   pantoprazole (PROTONIX) 40 MG tablet Take 1 tablet (40 mg total) by mouth daily.   potassium chloride (KLOR-CON) 10 MEQ tablet Take 10 mEq by mouth daily.    RESTASIS 0.05 % ophthalmic emulsion Place 1 drop into both eyes 2 (two) times daily.   tirzepatide (MOUNJARO) 7.5 MG/0.5ML Pen Inject 7.5 mg into the skin once a week.   torsemide (DEMADEX) 20 MG tablet Take 2 tablets (40 mg total) by mouth daily.   traMADol (ULTRAM) 50 MG tablet Take 50 mg by mouth 3 (three) times daily as needed for moderate pain.   vitamin B-12 (CYANOCOBALAMIN) 1000 MCG tablet Take 1,000 mcg by mouth daily.   [DISCONTINUED] HUMALOG KWIKPEN 100 UNIT/ML KiwkPen Inject 5-11 Units into the skin 3 (three) times daily. Sliding Scale   [DISCONTINUED] tirzepatide (MOUNJARO) 5 MG/0.5ML Pen Inject 5 mg into the skin once a week.   HUMALOG KWIKPEN 100 UNIT/ML KwikPen Inject 5-11 Units into the skin 3 (three) times daily. Sliding Scale   traZODone (DESYREL) 50 MG tablet Take 50-100 mg by mouth at bedtime as needed for sleep.   [DISCONTINUED] XULTOPHY 100-3.6 UNIT-MG/ML SOPN Inject 40 Units into the skin at bedtime. (Patient not taking: Reported on 12/22/2021)   No facility-administered encounter medications on file as of 12/22/2021.    ALLERGIES: Allergies  Allergen Reactions   Tetracyclines & Related Anaphylaxis and Rash   Banana Hives and Nausea And Vomiting   Penicillins Rash and Other (See Comments)    Has patient had a PCN reaction causing immediate rash, facial/tongue/throat swelling, SOB or lightheadedness with hypotension: No Has patient had a PCN reaction causing severe rash involving mucus membranes or skin necrosis: No Has patient had a PCN reaction that required hospitalization No Has patient had a PCN reaction occurring within the last 10 years: No If all of the above answers are "NO", then may proceed with Cephalosporin use.     VACCINATION STATUS: Immunization History  Administered Date(s) Administered  Fluad Quad(high Dose 65+) 11/08/2018, 12/12/2020   Influenza,inj,Quad PF,6+ Mos 12/24/2015   Influenza-Unspecified 12/02/2016   Pneumococcal Polysaccharide-23  10/22/2013    Diabetes She presents for her follow-up diabetic visit. She has type 2 diabetes mellitus. Onset time: Diagnosed at approx age of 73. Her disease course has been improving. There are no hypoglycemic associated symptoms. Associated symptoms include fatigue, polydipsia and polyuria. There are no hypoglycemic complications. Symptoms are improving. Diabetic complications include a CVA, heart disease and nephropathy. Risk factors for coronary artery disease include diabetes mellitus, dyslipidemia, family history, hypertension, post-menopausal and sedentary lifestyle. Current diabetic treatment includes intensive insulin program and oral agent (monotherapy) (and Mounjaro). She is compliant with treatment most of the time. Her weight is fluctuating minimally. She is following a generally healthy diet. When asked about meal planning, she reported none. She has not had a previous visit with a dietitian. She rarely participates in exercise. Her home blood glucose trend is fluctuating dramatically. Her overall blood glucose range is 180-200 mg/dl. (She presents today with her CGM showing fluctuating glycemic profile with subtle improvement overall.  Her POCT A1c today is 8.4%, improving from last visit of 8.8%.  Analysis of her CGM shows TIR 39%, TAR 61%, TBR 0% with a GMI of 8.3%.  She notes she has not eaten as healthy as she should lately, has been traveling back and forth to see grandson in the hospital (who passed away recently from poisoning).) An ACE inhibitor/angiotensin II receptor blocker is not being taken. She sees a podiatrist.Eye exam is current.     Review of systems  Constitutional: + minimally fluctuating body weight, current Body mass index is 35.94 kg/m., no fatigue, no subjective hyperthermia, no subjective hypothermia Eyes: no blurry vision, no xerophthalmia ENT: no sore throat, no nodules palpated in throat, no dysphagia/odynophagia, no hoarseness Cardiovascular: no chest pain,  no shortness of breath, no palpitations, no leg swelling Respiratory: no cough, no shortness of breath Gastrointestinal: no nausea/vomiting/diarrhea Musculoskeletal: no muscle/joint aches, walks with cane Skin: no rashes, no hyperemia Neurological: no tremors, no numbness, no tingling, no dizziness Psychiatric: no depression, no anxiety  Objective:     BP (!) 144/75 (BP Location: Right Arm, Patient Position: Sitting, Cuff Size: Large)   Pulse 91   Ht _0  (1.727 m)   Wt 236 lb 6.4 oz (107.2 kg)   BMI 35.94 kg/m   Wt Readings from Last 3 Encounters:  12/22/21 236 lb 6.4 oz (107.2 kg)  11/20/21 238 lb (108 kg)  10/16/21 234 lb 12.6 oz (106.5 kg)     BP Readings from Last 3 Encounters:  12/22/21 (!) 144/75  11/20/21 (!) 150/81  10/16/21 137/75     Physical Exam- Limited  Constitutional:  Body mass index is 35.94 kg/m. , not in acute distress, normal state of mind Eyes:  EOMI, no exophthalmos Neck: Supple Cardiovascular: RRR, no murmurs, rubs, or gallops, no edema Respiratory: Adequate breathing efforts, no crackles, rales, rhonchi, or wheezing Musculoskeletal: no gross deformities, strength intact in all four extremities, no gross restriction of joint movements, walks with cane Skin:  no rashes, no hyperemia Neurological: no tremor with outstretched hands   Diabetic Foot Exam - Simple   No data filed      CMP ( most recent) CMP     Component Value Date/Time   NA 136 10/16/2021 1340   K 4.1 10/16/2021 1340   CL 107 10/16/2021 1340   CO2 24 10/16/2021 1340   GLUCOSE 157 (H) 10/16/2021 1340  BUN 29 (H) 10/16/2021 1340   CREATININE 1.55 (H) 10/16/2021 1340   CALCIUM 8.6 (L) 10/16/2021 1340   CALCIUM 8.9 08/04/2017 1032   PROT 8.7 (H) 10/08/2021 1236   PROT 8.1 12/16/2015 1625   ALBUMIN 2.9 (L) 10/08/2021 1236   ALBUMIN 3.7 12/16/2015 1625   AST 69 (H) 10/08/2021 1236   ALT 94 (H) 10/08/2021 1236   ALKPHOS 399 (H) 10/08/2021 1236   BILITOT 0.6 10/08/2021  1236   BILITOT 0.2 12/16/2015 1625   GFRNONAA 36 (L) 10/16/2021 1340   GFRAA 54 (L) 09/05/2019 1021     Diabetic Labs (most recent): Lab Results  Component Value Date   HGBA1C 8.5 (A) 12/22/2021   HGBA1C 8.8 (H) 09/09/2021   HGBA1C 8.8 09/09/2021   MICROALBUR 5,373.1 (H) 04/03/2021   MICROALBUR 598.9 (H) 12/04/2019     Lipid Panel ( most recent) Lipid Panel     Component Value Date/Time   CHOL 178 10/16/2021 1340   CHOL 210 (H) 09/09/2021 1141   TRIG 79 10/16/2021 1340   HDL 67 10/16/2021 1340   HDL 63 09/09/2021 1141   CHOLHDL 2.7 10/16/2021 1340   VLDL 16 10/16/2021 1340   LDLCALC 95 10/16/2021 1340   LDLCALC 125 (H) 09/09/2021 1141   LABVLDL 22 09/09/2021 1141      Lab Results  Component Value Date   TSH 1.824 10/16/2021   TSH 2.213 07/24/2021   TSH 3.483 04/03/2021   TSH 2.253 11/28/2020   TSH 0.660 09/30/2020   TSH 0.559 06/28/2020   TSH 2.715 12/04/2019   TSH 1.267 06/05/2019   TSH 0.133 (L) 03/06/2019   TSH 0.939 12/06/2018           Assessment & Plan:   1) Type 2 diabetes mellitus with stage 3a chronic kidney disease, with long-term current use of insulin ([place])  She presents today with her CGM showing fluctuating glycemic profile with subtle improvement overall.  Her POCT A1c today is 8.4%, improving from last visit of 8.8%.  Analysis of her CGM shows TIR 39%, TAR 61%, TBR 0% with a GMI of 8.3%.  She notes she has not eaten as healthy as she should lately, has been traveling back and forth to see grandson in the hospital (who passed away recently from poisoning).  Stephanie [name] has currently uncontrolled symptomatic type 2 DM since 71 years of age.   -Recent labs reviewed.  - I had a long discussion with her about the progressive nature of diabetes and the pathology behind its complications. -her diabetes is complicated by CHF, CKD stage 3, CVA x 5 and she remains at a high risk for more acute and chronic complications which include CAD, CVA,  CKD, retinopathy, and neuropathy. These are all discussed in detail with her.  The following Lifestyle Medicine recommendations according to [facility]) were discussed and offered to patient and she agrees to start the journey:  A. Whole Foods, Plant-based plate comprising of fruits and vegetables, plant-based proteins, whole-grain carbohydrates was discussed in detail with the patient.   A list for source of those nutrients were also provided to the patient.  Patient will use only water or unsweetened tea for hydration. B.  The need to stay away from risky substances including alcohol, smoking; obtaining 7 to 9 hours of restorative sleep, at least 150 minutes of moderate intensity exercise weekly, the importance of healthy social connections,  and stress reduction techniques were discussed. C.  A full  color page of  Calorie density of various food groups per pound showing examples of each food groups was provided to the patient.  - Nutritional counseling repeated at each appointment due to patients tendency to fall back in to old habits.  - The patient admits there is a room for improvement in their diet and drink choices. -  Suggestion is made for the patient to avoid simple carbohydrates from their diet including Cakes, Sweet Desserts / Pastries, Ice Cream, Soda (diet and regular), Sweet Tea, Candies, Chips, Cookies, Sweet Pastries, Store Bought Juices, Alcohol in Excess of 1-2 drinks a day, Artificial Sweeteners, Coffee Creamer, and "Sugar-free" Products. This will help patient to have stable blood glucose profile and potentially avoid unintended weight gain.   - I encouraged the patient to switch to unprocessed or minimally processed complex starch and increased protein intake (animal or plant source), fruits, and vegetables.   - Patient is advised to stick to a routine mealtimes to eat 3 meals a day and avoid unnecessary snacks (to snack only to correct  hypoglycemia).  - I have approached her with the following individualized plan to manage her diabetes and patient agrees:   -She is advised to lower her Tresiba to 20 units SQ nightly, lower Humalog to 5-11 units TID with meals if glucose is above 90 and she is eating (Specific instructions on how to titrate insulin dosage based on glucose readings given to patient in writing), continue Jardiance 10 mg po daily and increase her Mounjaro to 7.5 mg SQ weekly once she has finished her supply of 5 mg doses.  The ultimate goal would be to further simplify her regimen and get her off insulin in the future by increasing her GLP1.   -she is encouraged to continue monitoring glucose 4 times daily (using her CGM), before meals and before bed, and to call the clinic if she has readings less than 70 or above 300 for 3 tests in a row.  - she is warned not to take insulin without proper monitoring per orders. - Adjustment parameters are given to her for hypo and hyperglycemia in writing.  - she is not a candidate for Metformin due to concurrent renal insufficiency.  - Specific targets for  A1c; LDL, HDL, and Triglycerides were discussed with the patient.  2) Blood Pressure /Hypertension:  her blood pressure is controlled to target for her age.   she is advised to continue her current medications including Bisoprolol 10 mg p.o. daily with breakfast.  3) Lipids/Hyperlipidemia:    Review of her recent lipid panel from 07/24/21 showed uncontrolled LDL at 119 .  she is advised to continue Lipitor 80 mg daily at bedtime.  Side effects and precautions discussed with her.  4)  Weight/Diet:  her Body mass index is 35.94 kg/m.  -  clearly complicating her diabetes care.   she is a candidate for weight loss. I discussed with her the fact that loss of 5 - 10% of her  current body weight will have the most impact on her diabetes management.  Exercise, and detailed carbohydrates information provided  -  detailed on  discharge instructions.  5) Chronic Care/Health Maintenance: -she is not on ACEI/ARB and is on Statin medications and is encouraged to initiate and continue to follow up with Ophthalmology, Dentist, Podiatrist at least yearly or according to recommendations, and advised to stay away from smoking. I have recommended yearly flu vaccine and pneumonia vaccine at least every 5 years; moderate intensity exercise  for up to 150 minutes weekly; and sleep for at least 7 hours a day.  - she is advised to maintain close follow up with [name], FNP for primary care needs, as well as her other providers for optimal and coordinated care.      I spent 40 minutes in the care of the patient today including review of labs from [place], Lipids, Thyroid Function, Hematology (current and previous including abstractions from other facilities); face-to-face time discussing  her blood glucose readings/logs, discussing hypoglycemia and hyperglycemia episodes and symptoms, medications doses, her options of short and long term treatment based on the latest standards of care / guidelines;  discussion about incorporating lifestyle medicine;  and documenting the encounter. Risk reduction counseling performed per USPSTF guidelines to reduce obesity and cardiovascular risk factors.     Please refer to Patient Instructions for Blood Glucose Monitoring and Insulin/Medications Dosing Guide"  in media tab for additional information. Please  also refer to " Patient Self Inventory" in the Media  tab for reviewed elements of pertinent patient history.  Stephanie [name] participated in the discussions, expressed understanding, and voiced agreement with the above plans.  All questions were answered to her satisfaction. she is encouraged to contact clinic should she have any questions or concerns prior to her return visit.     Follow up plan: - Return in about 3 months (around 03/24/2022) for Diabetes F/U with A1c in office, No previsit  labs, Bring meter and logs.   Rayetta Pigg, [facility] [address] [place], [place] [address] Phone: [phone] Fax: [phone]  12/22/2021, 4:30 PM

## 2021-12-23 DIAGNOSIS — F419 Anxiety disorder, unspecified: Secondary | ICD-10-CM | POA: Insufficient documentation

## 2022-01-05 ENCOUNTER — Other Ambulatory Visit: Payer: Self-pay | Admitting: Neurology

## 2022-01-21 ENCOUNTER — Encounter: Payer: Self-pay | Admitting: Gastroenterology

## 2022-01-21 ENCOUNTER — Ambulatory Visit (INDEPENDENT_AMBULATORY_CARE_PROVIDER_SITE_OTHER): Payer: Medicare Other | Admitting: Gastroenterology

## 2022-01-21 VITALS — BP 134/78 | HR 87 | Temp 97.0°F | Ht 68.0 in | Wt 236.6 lb

## 2022-01-21 DIAGNOSIS — K7581 Nonalcoholic steatohepatitis (NASH): Secondary | ICD-10-CM

## 2022-01-21 DIAGNOSIS — K5900 Constipation, unspecified: Secondary | ICD-10-CM | POA: Diagnosis not present

## 2022-01-21 DIAGNOSIS — K219 Gastro-esophageal reflux disease without esophagitis: Secondary | ICD-10-CM | POA: Diagnosis not present

## 2022-01-21 DIAGNOSIS — R7989 Other specified abnormal findings of blood chemistry: Secondary | ICD-10-CM | POA: Diagnosis not present

## 2022-01-21 MED ORDER — LUBIPROSTONE 24 MCG PO CAPS: 24.0000 ug | ORAL_CAPSULE | Freq: Two times a day (BID) | ORAL | 11 refills | Status: DC

## 2022-01-21 MED ORDER — PANTOPRAZOLE SODIUM 40 MG PO TBEC: 40.0000 mg | DELAYED_RELEASE_TABLET | Freq: Every day | ORAL | 3 refills | Status: DC

## 2022-01-21 NOTE — Patient Instructions (Signed)
Continue pantoprazole 40 mg daily before breakfast. Increase Amitiza 24 mcg once to twice daily with food for constipation. I will follow-up on labs as available in January regarding liver. Return office visit in 6 months.

## 2022-01-21 NOTE — Progress Notes (Signed)
GI Office Note    Referring Provider: [name], FNP Primary Care Physician:  [name], FNP  Primary Gastroenterologist: Garfield Cornea, MD   Chief Complaint   Chief Complaint  Patient presents with   Follow-up    Follow up on GERD and constipation    History of Present Illness   Stephanie MCEUEN is a 71 y.o. female presenting today for follow-up of GERD and constipation.  Last seen in December 2022.  Also with history of elevated mixed pattern LFTs with thorough evaluation in the setting of fatty liver with AMA negative, ANA positive, ASMA weakly positive.  Negative hepatitis B and C.  Hemochromatosis negative.  Immunoglobulins normal.Followed by oncology with history of nephrotic syndrome, IgG kappa monoclonal gammopathy, observed for now. Liver biopsy 2017 with mild chronic hepatitis, mild fatty liver, non significant fibrosis, non-specific findings and likely secondary to drug-induced etiology. Updated liver biopsy this year consistent with NASH and mild fibrosis. No evidence for autoimmune hepatitis or iron overload.    GERD doing ok. [place] 1 stools regularly. Does not feel like stool productive. No melena, brbpr. Taking Amitiza 33mg BID. No abdominal pain.      Component     Latest Ref Rng 05/17/2021      AST     15 - 41 U/L 27      AST      30      ALT     0 - 44 U/L 33      ALT      35      Alkaline Phosphatase     38 - 126 U/L 350 (H)      Alkaline Phosphatase      351 (H)      Total Bilirubin     0.3 - 1.2 mg/dL 0.8      Total Bilirubin      0.7       Component     Latest Ref Rng 07/24/2021 10/08/2021 10/16/2021  AST     15 - 41 U/L 42 (H)  69 (H)    ALT     0 - 44 U/L 57 (H)  94 (H)    Alkaline Phosphatase     38 - 126 U/L 384 (H)  399 (H)    Total Bilirubin     0.3 - 1.2 mg/dL 0.7  0.6            Medications   Current Outpatient Medications  Medication Sig Dispense Refill   Accu-Chek FastClix Lancets MISC Apply topically.      ACCU-CHEK GUIDE test strip 4 (four) times daily.     acetaminophen (TYLENOL) 500 MG tablet Take 1,000 mg by mouth every 6 (six) hours as needed for moderate pain or headache.     albuterol (PROVENTIL) (2.5 MG/3ML) 0.083% nebulizer solution Take 2.5 mg by nebulization every 6 (six) hours as needed for wheezing or shortness of breath.     albuterol (VENTOLIN HFA) 108 (90 Base) MCG/ACT inhaler Inhale 1-2 puffs into the lungs every 6 (six) hours as needed for wheezing or shortness of breath. 18 g 0   Alcohol Swabs (ALCOHOL PADS) 70 % PADS SMARTSIG:Pledget(s) Topical 4 Times Daily     apixaban (ELIQUIS) 5 MG TABS tablet Take 1 tablet (5 mg total) by mouth 2 (two) times daily.     ARNUITY ELLIPTA 100 MCG/ACT AEPB Inhale 1 puff into the lungs at bedtime.   11   Ascorbic Acid (  VITAMIN C) 1000 MG tablet Take 500 mg by mouth daily.     atorvastatin (LIPITOR) 80 MG tablet Take 1 tablet (80 mg total) by mouth daily. 30 tablet 0   bisoprolol (ZEBETA) 10 MG tablet TAKE 1 TABLET(10 MG) BY MOUTH IN THE MORNING AND AT BEDTIME 180 tablet 3   Blood Glucose Calibration (ACCU-CHEK GUIDE CONTROL) LIQD See admin instructions.     Blood Glucose Monitoring Suppl (ACCU-CHEK GUIDE ME) w/Device KIT 4 (four) times daily.     Cetirizine HCl 10 MG CAPS Take 10 mg by mouth daily.     Cholecalciferol (VITAMIN D) 2000 units tablet Take 4,000 Units by mouth daily.      cimetidine (TAGAMET) 200 MG tablet Take 0.5 tablets (100 mg total) by mouth daily as needed. (Patient taking differently: Take 100 mg by mouth daily as needed (indigestion).)     CINNAMON PO Take 1-2 capsules by mouth 3 (three) times daily.     clobetasol ointment (TEMOVATE) 4.09 % Apply 1 application topically 3 (three) times daily as needed (imflammation).     diphenhydrAMINE (BENADRYL) 25 mg capsule Take 50 mg by mouth every 4 (four) hours as needed for itching.     DULoxetine (CYMBALTA) 30 MG capsule Take 1 capsule (30 mg total) by mouth 2 (two) times daily. 30  capsule -0   EASY COMFORT PEN NEEDLES 31G X 5 MM MISC INJECT IN SULIN EVERY DAY AS DIRECTED     empagliflozin (JARDIANCE) 10 MG TABS tablet Take 1 tablet (10 mg total) by mouth daily. 90 tablet 3   fluticasone (VERAMYST) 27.5 MCG/SPRAY nasal spray Place 2 sprays into the nose daily.     guaiFENesin (ROBITUSSIN) 100 MG/5ML SOLN Take 5 mLs (100 mg total) by mouth every 4 (four) hours as needed for cough or to loosen phlegm. 236 mL 0   HUMALOG KWIKPEN 100 UNIT/ML KwikPen Inject 5-11 Units into the skin 3 (three) times daily. Sliding Scale 30 mL 3   hydrOXYzine (ATARAX/VISTARIL) 25 MG tablet Take 25 mg by mouth every 8 (eight) hours as needed for itching.      insulin degludec (TRESIBA FLEXTOUCH) 100 UNIT/ML FlexTouch Pen Inject 20 Units into the skin at bedtime. 18 mL 3   ipratropium-albuterol (DUONEB) 0.5-2.5 (3) MG/3ML SOLN Take 3 mLs by nebulization every 6 (six) hours as needed (shortness of breath).     Lancet Devices (EASY MINI EJECT LANCING DEVICE) MISC 4 (four) times daily.     levothyroxine (SYNTHROID) 88 MCG tablet Take 88 mcg by mouth daily before breakfast.     lubiprostone (AMITIZA) 8 MCG capsule TAKE 1 CAPSULE(8 MCG) BY MOUTH TWICE DAILY WITH A MEAL (Patient taking differently: Take 8 mcg by mouth daily as needed (IBS). With meals) 60 capsule 3   LYRICA 75 MG capsule Take 75 mg by mouth 2 (two) times daily.      methocarbamol (ROBAXIN) 500 MG tablet Take 1 tablet (500 mg total) by mouth 3 (three) times daily as needed. (Patient taking differently: Take 500 mg by mouth 3 (three) times daily as needed for muscle spasms.) 60 tablet 1   montelukast (SINGULAIR) 10 MG tablet Take 10 mg by mouth at bedtime.     omega-3 acid ethyl esters (LOVAZA) 1 g capsule Take 2 capsules (2 g total) by mouth 2 (two) times daily. 120 capsule 0   pantoprazole (PROTONIX) 40 MG tablet Take 1 tablet (40 mg total) by mouth daily. 90 tablet 3   potassium chloride (KLOR-CON) 10 MEQ  tablet Take 10 mEq by mouth daily.      REPATHA SURECLICK 035 MG/ML SOAJ ADMINISTER 1 ML UNDER THE SKIN EVERY 14 DAYS 2 mL 3   RESTASIS 0.05 % ophthalmic emulsion Place 1 drop into both eyes 2 (two) times daily.     tirzepatide (MOUNJARO) 7.5 MG/0.5ML Pen Inject 7.5 mg into the skin once a week. 6 mL 3   torsemide (DEMADEX) 20 MG tablet Take 2 tablets (40 mg total) by mouth daily. 60 tablet 0   traMADol (ULTRAM) 50 MG tablet Take 50 mg by mouth 3 (three) times daily as needed for moderate pain.  1   traZODone (DESYREL) 50 MG tablet Take 50-100 mg by mouth at bedtime as needed for sleep.     vitamin B-12 (CYANOCOBALAMIN) 1000 MCG tablet Take 1,000 mcg by mouth daily.     No current facility-administered medications for this visit.    Allergies   Allergies as of 01/21/2022 - Review Complete 01/21/2022  Allergen Reaction Noted   Tetracyclines & related Anaphylaxis and Rash 09/19/2010   Banana Hives and Nausea And Vomiting 05/04/2013   Penicillins Rash and Other (See Comments) 09/19/2010      Review of Systems   General: Negative for anorexia, weight loss, fever, chills, fatigue, weakness. ENT: Negative for hoarseness, difficulty swallowing , nasal congestion. CV: Negative for chest pain, angina, palpitations, dyspnea on exertion, peripheral edema.  Respiratory: Negative for dyspnea at rest, dyspnea on exertion, cough, sputum, wheezing.  GI: See history of present illness. GU:  Negative for dysuria, hematuria, urinary incontinence, urinary frequency, nocturnal urination.  Endo: Negative for unusual weight change.     Physical Exam   BP 134/78   Pulse 87   Temp (!) 97 F (36.1 C)   Ht _0  (1.727 m)   Wt 236 lb 9.6 oz (107.3 kg)   BMI 35.97 kg/m    General: Well-nourished, well-developed in no acute distress.  Eyes: No icterus. Mouth: Oropharyngeal mucosa moist and pink.  Abdomen: Bowel sounds are normal,  nondistended, no hepatosplenomegaly or masses,  no abdominal bruits or hernia , no rebound or guarding.  Mild diffuse tenderness Rectal: not performed Extremities: No lower extremity edema. No clubbing or deformities. Neuro: Alert and oriented x 4   Skin: Warm and dry, no jaundice.   Psych: Alert and cooperative, normal mood and affect.  Labs   Lab Results  Component Value Date   HGBA1C 8.5 (A) 12/22/2021   October 2023: White blood cell count 12,800, hemoglobin 12.6, platelets 203,000  Lab Results  Component Value Date   ALT 94 (H) 10/08/2021   AST 69 (H) 10/08/2021   GGT 872 (H) 04/04/2018   ALKPHOS 399 (H) 10/08/2021   BILITOT 0.6 10/08/2021   Lab Results  Component Value Date   IRON 83 10/08/2021   TIBC 295 10/08/2021   FERRITIN 112 10/16/2021   No results found for: "VITAMINB12" No results found for: "FOLATE" Lab Results  Component Value Date   INR 1.0 08/29/2021   INR 1.1 05/17/2021   INR 1.0 10/15/2020     Imaging Studies   No results found.  Assessment   Constipation: poorly controlled.  GERD: doing well.   NASH/elevated LFTs: persistently elevated alkphos. AST/ALT intermittently elevated. Updated liver biopsy in 2022 most c/w minimally active steatohepatitis, and mild fibrosis. Overlapping mild drug-induced liver injury cannot be ruled out. No evidence of autoimmune hepatitis or iron overload. She has upcoming labs next month for oncology. We will update liver  labs at that time. She may need updated imaging of the liver based on findings.    PLAN   Increase to Amitiza 23mg BID. Labs in 02/2022, PBC profile, LFTs, GGT.  Continue pantoprazole 469mdaily.  Return ov in six months.   LeLaureen OchsLeBobby RumpfMHHidden HillsPABellwoodastroenterology Associates

## 2022-01-29 ENCOUNTER — Other Ambulatory Visit (HOSPITAL_COMMUNITY)
Admission: RE | Admit: 2022-01-29 | Discharge: 2022-01-29 | Disposition: A | Discharge status: Home / Self Care | Payer: Medicare Other | Source: Ambulatory Visit | Attending: Nephrology | Admitting: Nephrology

## 2022-01-29 ENCOUNTER — Encounter: Payer: Self-pay | Admitting: Emergency Medicine

## 2022-01-29 ENCOUNTER — Other Ambulatory Visit: Payer: Self-pay

## 2022-01-29 ENCOUNTER — Ambulatory Visit
Admission: EM | Admit: 2022-01-29 | Discharge: 2022-01-29 | Disposition: A | Discharge status: Home / Self Care | Payer: Medicare Other | Attending: Nurse Practitioner | Admitting: Nurse Practitioner

## 2022-01-29 ENCOUNTER — Ambulatory Visit (INDEPENDENT_AMBULATORY_CARE_PROVIDER_SITE_OTHER): Payer: Medicare Other

## 2022-01-29 DIAGNOSIS — R000 Tachycardia, unspecified: Secondary | ICD-10-CM | POA: Diagnosis not present

## 2022-01-29 DIAGNOSIS — M25512 Pain in left shoulder: Secondary | ICD-10-CM | POA: Diagnosis not present

## 2022-01-29 DIAGNOSIS — E786 Lipoprotein deficiency: Secondary | ICD-10-CM | POA: Insufficient documentation

## 2022-01-29 DIAGNOSIS — E039 Hypothyroidism, unspecified: Secondary | ICD-10-CM | POA: Diagnosis not present

## 2022-01-29 DIAGNOSIS — E1121 Type 2 diabetes mellitus with diabetic nephropathy: Secondary | ICD-10-CM | POA: Diagnosis not present

## 2022-01-29 DIAGNOSIS — E1165 Type 2 diabetes mellitus with hyperglycemia: Secondary | ICD-10-CM | POA: Insufficient documentation

## 2022-01-29 LAB — COMPREHENSIVE METABOLIC PANEL
ALT: 59 U/L — ABNORMAL HIGH (ref 0–44)
AST: 46 U/L — ABNORMAL HIGH (ref 15–41)
Albumin: 3.1 g/dL — ABNORMAL LOW (ref 3.5–5.0)
Alkaline Phosphatase: 351 U/L — ABNORMAL HIGH (ref 38–126)
Anion gap: 8 (ref 5–15)
BUN: 17 mg/dL (ref 8–23)
CO2: 22 mmol/L (ref 22–32)
Calcium: 8.9 mg/dL (ref 8.9–10.3)
Chloride: 104 mmol/L (ref 98–111)
Creatinine, Ser: 1.31 mg/dL — ABNORMAL HIGH (ref 0.44–1.00)
GFR, Estimated: 44 mL/min — ABNORMAL LOW (ref >60)
Glucose, Bld: 351 mg/dL — ABNORMAL HIGH (ref 70–99)
Potassium: 3.7 mmol/L (ref 3.5–5.1)
Sodium: 134 mmol/L — ABNORMAL LOW (ref 135–145)
Total Bilirubin: 0.5 mg/dL (ref 0.3–1.2)
Total Protein: 8.7 g/dL — ABNORMAL HIGH (ref 6.5–8.1)

## 2022-01-29 LAB — LIPID PANEL
Cholesterol: 150 mg/dL (ref 0–200)
HDL: 72 mg/dL (ref >40)
LDL Cholesterol: 52 mg/dL (ref 0–99)
Total CHOL/HDL Ratio: 2.1 RATIO
Triglycerides: 130 mg/dL (ref <150)
VLDL: 26 mg/dL (ref 0–40)

## 2022-01-29 LAB — TSH: TSH: 1.934 u[IU]/mL (ref 0.350–4.500)

## 2022-01-29 MED ORDER — LIDOCAINE 5 % EX PTCH
1.0000 | MEDICATED_PATCH | CUTANEOUS | 0 refills | Status: AC
Start: 2022-01-29 — End: ?

## 2022-01-29 NOTE — ED Provider Notes (Signed)
[facility]    CSN: [id number] Arrival date & time: 01/29/22  1538      History   Chief Complaint No chief complaint on file.   HPI Stephanie [name] is a 71 y.o. female.   The history is provided by the patient.   The patient presents for complaints of left shoulder pain that started over the past 24 hours.  Patient reports that she fell approximately 1 week ago, and thought that she fell onto her right side.  She states over the past 24 hours, she has noticed increased pain and decreased range of motion in the left shoulder.  She states the pain is in the generalized plane of the shoulder.  She states that she has some neck pain, but denies radiation, numbness, and tingling into the neck or shoulder.  Patient denies previous injury or trauma.  She reports she has been taking tramadol for her pain.  She states medication is not helping her symptoms.  Patient was found to be tachycardic in triage.  Patient reports that she completed an albuterol inhaler and albuterol nebulizer treatment prior to coming to this appointment for her asthma exacerbation.  She denies chest pain, shortness of breath, difficulty breathing, or wheezing at this time.  Patient currently takes bisoprolol for her elevated heart rate. 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   Type 2 diabetes mellitus ([place])     Patient Active Problem List   Diagnosis Date Noted   Bilateral primary osteoarthritis of knee 09/15/2021   Acute on chronic combined  systolic and diastolic CHF (congestive heart failure) ([place]) 05/20/2021   At risk for obstructive sleep apnea 05/20/2021   Acute CVA (cerebrovascular accident) ([place]) 05/17/2021   Protein-calorie malnutrition, moderate ([place]) 05/17/2021   Cerebral infarction, unspecified ([place]) 05/17/2021   NASH (nonalcoholic steatohepatitis) 01/21/2021   Mance (dyspnea on exertion) 06/18/2020   Chronic diastolic CHF (congestive heart failure) ([place])    COVID-19 03/17/2020   Pneumonia due to COVID-19 virus 03/11/2020   Acute respiratory disease due to COVID-19 virus 03/11/2020   Abnormal LFTs 02/28/2018   TIA (transient ischemic attack) 09/15/2017   Localized osteoarthritis of right shoulder 08/08/2017   Acute embolism and thrombosis of unspecified deep veins of lower extremity, bilateral ([place]) 03/16/2017   Anemia of chronic renal failure 03/16/2017   Atrial fibrillation ([place]) 03/16/2017   Chronic kidney disease, stage 3 unspecified ([place]) 41/66/0630   Metabolic acidosis 16/02/930   Dysphagia 12/16/2015   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   Constipation 10/27/2015   Diarrhea 08/02/2015   Elevated alkaline phosphatase level 07/23/2015   Heme positive stool 07/23/2015   Hypoglycemia 07/01/2015   Deep vein thrombosis (DVT) of both lower extremities ([place]) 06/27/2015   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   Maculopapular rash, generalized 06/27/2015   Alopecia 06/27/2015   Hypothyroidism 06/27/2015   Diverticulitis 10/19/2013   Tobacco abuse 10/19/2013   Obesity 10/19/2013   Diabetes ([place]) 10/19/2013  Hypertension    Anemia    GERD (gastroesophageal reflux disease)    COPD (chronic obstructive pulmonary disease) (HCC)     Past Surgical History:  Procedure Laterality Date   ABDOMINAL HYSTERECTOMY  partial   CARPAL TUNNEL RELEASE Right 1991   CATARACT EXTRACTION W/PHACO Right 05/08/2013   Procedure: CATARACT EXTRACTION PHACO AND INTRAOCULAR LENS PLACEMENT ([place]);   Surgeon: Tonny Branch, MD;  Location: [facility];  Service: Ophthalmology;  Laterality: Right;  CDE 10.31   CATARACT EXTRACTION W/PHACO Left 08/17/2013   Procedure: CATARACT EXTRACTION PHACO AND INTRAOCULAR LENS PLACEMENT (IOC);  Surgeon: Tonny Branch, MD;  Location: [facility];  Service: Ophthalmology;  Laterality: Left;  CDE:9.03   CHOLECYSTECTOMY  1971   COLONOSCOPY WITH PROPOFOL N/A 01/06/2016   Dr. Gala Romney: diverticulosis    DENTAL SURGERY     ESOPHAGEAL BRUSHING  08/29/2019   Procedure: ESOPHAGEAL BRUSHING;  Surgeon: Daneil Dolin, MD;  [facility];  Service: Endoscopy;;   ESOPHAGOGASTRODUODENOSCOPY (EGD) WITH PROPOFOL N/A 01/06/2016   Dr. Gala Romney: normal s/p empiric dilation    ESOPHAGOGASTRODUODENOSCOPY (EGD) WITH PROPOFOL N/A 08/29/2019   esophageal plaques vs medication residue adherent to tubular esophagus s/p KOH brushing and dilation. Medium-sized hiatal hernia. + for candida. Diflucan.    EYE SURGERY     MALONEY DILATION N/A 01/06/2016   Procedure: Venia Minks DILATION;  Surgeon: Daneil Dolin, MD;  [facility];  Service: Endoscopy;  Laterality: N/A;   MALONEY DILATION N/A 08/29/2019   Procedure: Venia Minks DILATION;  Surgeon: Daneil Dolin, MD;  [facility];  Service: Endoscopy;  Laterality: N/A;   REVERSE SHOULDER ARTHROPLASTY Right 08/06/2017   Procedure: RIGHT REVERSE SHOULDER ARTHROPLASTY;  Surgeon: Netta Cedars, MD;  Location: [facility];  Service: Orthopedics;  Laterality: Right;   WRIST GANGLION EXCISION Left     OB History   No obstetric history on file.      Home Medications    Prior to Admission medications   Medication Sig Start Date End Date Taking? Authorizing Provider  lidocaine (LIDODERM) 5 % Place 1 patch onto the skin daily. Remove & Discard patch within 12 hours or as directed by MD 01/29/22  Yes Tylar Merendino-Warren, Alda Lea, NP  Accu-Chek FastClix Lancets MISC Apply topically. 03/15/20   [provider]  ACCU-CHEK GUIDE test strip 4  (four) times daily. 07/02/20   [provider]  acetaminophen (TYLENOL) 500 MG tablet Take 1,000 mg by mouth every 6 (six) hours as needed for moderate pain or headache.    [provider]  albuterol (PROVENTIL) (2.5 MG/3ML) 0.083% nebulizer solution Take 2.5 mg by nebulization every 6 (six) hours as needed for wheezing or shortness of breath.    [provider]  albuterol (VENTOLIN HFA) 108 (90 Base) MCG/ACT inhaler Inhale 1-2 puffs into the lungs every 6 (six) hours as needed for wheezing or shortness of breath. 03/21/21   Volney American, PA-C  Alcohol Swabs (ALCOHOL PADS) 70 % PADS SMARTSIG:Pledget(s) Topical 4 Times Daily 08/12/20   [provider]  apixaban (ELIQUIS) 5 MG TABS tablet Take 1 tablet (5 mg total) by mouth 2 (two) times daily. 05/21/21   Arrien, Jimmy Picket, MD  ARNUITY ELLIPTA 100 MCG/ACT AEPB Inhale 1 puff into the lungs at bedtime.  06/13/17   [provider]  Ascorbic Acid (VITAMIN C) 1000 MG tablet Take 500 mg by mouth daily.    [provider]  atorvastatin (LIPITOR) 80 MG tablet Take 1 tablet (80 mg  total) by mouth daily. 05/22/21 06/24/58  Arrien, Jimmy Picket, MD  bisoprolol (ZEBETA) 10 MG tablet TAKE 1 TABLET(10 MG) BY MOUTH IN THE MORNING AND AT BEDTIME 12/12/21   Strader, [place] M, PA-C  Blood Glucose Calibration (ACCU-CHEK GUIDE CONTROL) LIQD See admin instructions. 07/02/20   [provider]  Blood Glucose Monitoring Suppl (ACCU-CHEK GUIDE ME) w/Device KIT 4 (four) times daily. 07/02/20   [provider]  Cetirizine HCl 10 MG CAPS Take 10 mg by mouth daily.    [provider]  Cholecalciferol (VITAMIN D) 2000 units tablet Take 4,000 Units by mouth daily.     [provider]  cimetidine (TAGAMET) 200 MG tablet Take 0.5 tablets (100 mg total) by mouth daily as needed. Patient taking differently: Take 100 mg by mouth daily as needed (indigestion). 03/22/20   Barton Dubois, MD   CINNAMON PO Take 1-2 capsules by mouth 3 (three) times daily.    [provider]  clobetasol ointment (TEMOVATE) 9.56 % Apply 1 application topically 3 (three) times daily as needed (imflammation). 06/20/15   [provider]  diphenhydrAMINE (BENADRYL) 25 mg capsule Take 50 mg by mouth every 4 (four) hours as needed for itching.    [provider]  DULoxetine (CYMBALTA) 30 MG capsule Take 1 capsule (30 mg total) by mouth 2 (two) times daily. 09/16/17   Hall, Lorenda Cahill, DO  EASY COMFORT PEN NEEDLES 31G X 5 MM MISC INJECT IN SULIN EVERY DAY AS DIRECTED 06/02/21   [provider]  empagliflozin (JARDIANCE) 10 MG TABS tablet Take 1 tablet (10 mg total) by mouth daily. 11/20/21   Brita Romp, NP  fluticasone (VERAMYST) 27.5 MCG/SPRAY nasal spray Place 2 sprays into the nose daily.    [provider]  guaiFENesin (ROBITUSSIN) 100 MG/5ML SOLN Take 5 mLs (100 mg total) by mouth every 4 (four) hours as needed for cough or to loosen phlegm. 03/17/20   Manuella Ghazi, Pratik D, DO  HUMALOG KWIKPEN 100 UNIT/ML KwikPen Inject 5-11 Units into the skin 3 (three) times daily. Sliding Scale 12/22/21   Brita Romp, NP  hydrOXYzine (ATARAX/VISTARIL) 25 MG tablet Take 25 mg by mouth every 8 (eight) hours as needed for itching.  06/20/15   [provider]  insulin degludec (TRESIBA FLEXTOUCH) 100 UNIT/ML FlexTouch Pen Inject 20 Units into the skin at bedtime. 12/22/21   Brita Romp, NP  ipratropium-albuterol (DUONEB) 0.5-2.5 (3) MG/3ML SOLN Take 3 mLs by nebulization every 6 (six) hours as needed (shortness of breath). 05/03/21   [provider]  Lancet Devices (EASY MINI EJECT LANCING DEVICE) MISC 4 (four) times daily. 07/02/20   [provider]  levothyroxine (SYNTHROID) 88 MCG tablet Take 88 mcg by mouth daily before breakfast.    [provider]  lubiprostone (AMITIZA) 24 MCG capsule Take 1 capsule (24 mcg total) by mouth 2 (two) times  daily with a meal. 01/21/22   Mahala Menghini, PA-C  LYRICA 75 MG capsule Take 75 mg by mouth 2 (two) times daily.  07/18/15   [provider]  methocarbamol (ROBAXIN) 500 MG tablet Take 1 tablet (500 mg total) by mouth 3 (three) times daily as needed. Patient taking differently: Take 500 mg by mouth 3 (three) times daily as needed for muscle spasms. 08/06/17   Netta Cedars, MD  montelukast (SINGULAIR) 10 MG tablet Take 10 mg by mouth at bedtime. 04/02/21   [provider]  omega-3 acid ethyl esters (LOVAZA) 1 g capsule Take  2 capsules (2 g total) by mouth 2 (two) times daily. 05/21/21 06/24/58  Arrien, Jimmy Picket, MD  pantoprazole (PROTONIX) 40 MG tablet Take 1 tablet (40 mg total) by mouth daily before breakfast. 01/21/22   Mahala Menghini, PA-C  potassium chloride (KLOR-CON) 10 MEQ tablet Take 10 mEq by mouth daily.    [provider]  REPATHA SURECLICK 267 MG/ML SOAJ ADMINISTER 1 ML UNDER THE SKIN EVERY 14 DAYS 01/05/22   Garvin Fila, MD  RESTASIS 0.05 % ophthalmic emulsion Place 1 drop into both eyes 2 (two) times daily. 08/21/20   [provider]  tirzepatide Darcel Bayley) 7.5 MG/0.5ML Pen Inject 7.5 mg into the skin once a week. 12/22/21   Brita Romp, NP  torsemide (DEMADEX) 20 MG tablet Take 2 tablets (40 mg total) by mouth daily. 07/02/15   Samuella Cota, MD  traMADol (ULTRAM) 50 MG tablet Take 50 mg by mouth 3 (three) times daily as needed for moderate pain. 01/07/18   [provider]  traZODone (DESYREL) 50 MG tablet Take 50-100 mg by mouth at bedtime as needed for sleep.    [provider]  vitamin B-12 (CYANOCOBALAMIN) 1000 MCG tablet Take 1,000 mcg by mouth daily.    [provider]    Family History Family History  Problem Relation Age of Onset   Hypertension Mother    Diabetes Mother    COPD Mother    Arthritis Mother    Diabetes Father    Arthritis Father    Dementia Father    CAD Father     Hypothyroidism Sister    Diabetes Brother    Colon cancer Niece    Colon polyps Neg Hx     Social History Social History   Tobacco Use   Smoking status: Former    Packs/day: 0.25    Years: 30.00    Total pack years: 7.50    Types: Cigarettes    Quit date: 02/17/2011    Years since quitting: 10.9   Smokeless tobacco: Never  Vaping Use   Vaping Use: Never used  Substance Use Topics   Alcohol use: Not Currently    Comment: rare   Drug use: No     Allergies   Tetracyclines & related, Banana, and Penicillins   Review of Systems Review of Systems Per HPI  Physical Exam Triage Vital Signs ED Triage Vitals  Enc Vitals Group     BP 01/29/22 1557 (!) 156/104     Pulse Rate 01/29/22 1557 (!) 124     Resp 01/29/22 1557 (!) 24     Temp 01/29/22 1557 98.8 F (37.1 C)     Temp Source 01/29/22 1557 Oral     SpO2 01/29/22 1557 96 %     Weight --      Height --      Head Circumference --      Peak Flow --      Pain Score 01/29/22 1558 10     Pain Loc --      Pain Edu? --      Excl. in [place]? --    No data found.  Updated Vital Signs BP (!) 144/88 (BP Location: Right Arm)   Pulse (!) 135   Temp 98.8 F (37.1 C) (Oral)   Resp (!) 24   SpO2 96%   Visual Acuity Right Eye Distance:   Left Eye Distance:   Bilateral Distance:    Right Eye Near:   Left Eye  Near:    Bilateral Near:     Physical Exam Vitals and nursing note reviewed.  Constitutional:      General: She is not in acute distress.    Appearance: Normal appearance.  HENT:     Head: Normocephalic.  Eyes:     Extraocular Movements: Extraocular movements intact.     Pupils: Pupils are equal, round, and reactive to light.  Cardiovascular:     Rate and Rhythm: Regular rhythm. Tachycardia present.     Pulses: Normal pulses.     Heart sounds: Normal heart sounds.  Pulmonary:     Effort: Pulmonary effort is normal. No respiratory distress.     Breath sounds: Normal breath sounds. No stridor. No wheezing,  rhonchi or rales.  Abdominal:     General: Bowel sounds are normal.     Palpations: Abdomen is soft.  Musculoskeletal:     Left shoulder: Tenderness present. No swelling or deformity. Decreased range of motion. Decreased strength. Normal pulse.     Cervical back: Normal range of motion.     Comments: Tenderness noted to the anterior, lateral, superior, and posterior aspect of the left shoulder.  Lymphadenopathy:     Cervical: No cervical adenopathy.  Skin:    General: Skin is warm and dry.  Neurological:     General: No focal deficit present.     Mental Status: She is alert and oriented to person, place, and time.  Psychiatric:        Mood and Affect: Mood normal.        Behavior: Behavior normal.      UC Treatments / Results  Labs (all labs ordered are listed, but only abnormal results are displayed) Labs Reviewed - No data to display  EKG: Sinus tachycardia with no acute STEMI.  Compared to previous EKGs performed on 05/19/2021, 05/16/2021, and 09/30/2020.   Radiology DG Shoulder Left  Result Date: 01/29/2022 CLINICAL DATA:  [place] 1 week ago, limited range of motion EXAM: LEFT SHOULDER - 2+ VIEW COMPARISON:  08/25/2012 FINDINGS: Internal rotation, external rotation, and transscapular views of the left shoulder are obtained. No acute displaced fracture, subluxation, or dislocation. Moderate degenerative changes of the acromioclavicular and glenohumeral joints. Soft tissues are unremarkable. The left chest is clear. IMPRESSION: 1. Moderate left shoulder osteoarthritis.  No acute fracture. Electronically Signed   By: Randa Ngo M.D.   On: 01/29/2022 16:39     Clinical Course as of 01/29/22 1707  Thu Jan 29, 2022  1650 BP recheck: 144/88 [CL]    Clinical Course User Index [CL] Camil Hausmann-Warren, Alda Lea, NP  Trial ambulation prior to discharge performed which showed an O2 sat between 96 and 98%.   Procedures Procedures (including critical care time)  Medications Ordered in  UC Medications - No data to display  Initial Impression / Assessment and Plan / UC Course  I have reviewed the triage vital signs and the nursing notes.  Pertinent labs & imaging results that were available during my care of the patient were reviewed by me and considered in my medical decision making (see chart for details).  The patient appears uncomfortable due to her left shoulder pain.  Vital signs show she is tachycardic, and tachypneic.  Trial ambulation was performed post discharge, sats remained between 96 and 98%.  Respiratory rate did not decrease.  Patient is speaking in complete sentences, she is alert and oriented.  Patient denies chest pain.  Patient presents for complaints of left shoulder pain.  During  triage, patient was found to be tachypneic and tachycardic.  EKG was performed which did not show signs of acute STEMI.  X-ray of the left shoulder was also performed which did not reveal any new fracture or dislocation; however, revealed an underlying history of osteoarthritis.  For patient's pain, lidocaine 5% topical patch was prescribed to help with her pain.  Patient is currently taking tramadol and pregabalin for chronic pain.  Supportive care recommendations were provided to the patient to include the use of ice or heat, and gentle stretching and range of motion exercises.  Given the patient's severity of her symptoms, recommended that she follow-up with orthopedics for further evaluation.  Patient was also given strict indications of when to follow-up in the emergency department with regard to her elevated heart rate.  Patient verbalizes understanding.  All questions were answered.  Patient is stable for discharge. Final Clinical Impressions(s) / UC Diagnoses   Final diagnoses:  Left shoulder pain, unspecified chronicity  Tachycardia     Discharge Instructions      The x-ray does not show any new fracture or dislocation.  The x-ray does show osteoarthritis of the left  shoulder. Apply medication as prescribed. Continue use of tramadol as needed for pain or discomfort.  If symptoms worsen, recommend following up with orthopedics for further evaluation. May apply ice or heat as needed.  Apply ice for pain or swelling, heat for spasm or stiffness.  Apply for 20 minutes, remove for 1 hour, then repeat is much as possible. Gentle stretching and range of motion exercises to help keep the joint mobile.  This will also help decrease your pain. If symptoms worsen or fail to improve, as discussed, it is recommend that you follow-up with your primary care physician or with orthopedics.  You can follow-up with [facility] at [phone] or with [facility] at [phone].  With regard to your elevated heart rate, if you develop chest pain, shortness of breath, difficulty breathing, or other concerns, please go to the emergency department immediately.  Also recommend that you follow-up with your cardiologist if symptoms do not improve.  Recommend follow-up within the next 48 to 72 hours.  Follow-up as needed.     ED Prescriptions     Medication Sig Dispense Auth. Provider   lidocaine (LIDODERM) 5 % Place 1 patch onto the skin daily. Remove & Discard patch within 12 hours or as directed by MD 30 patch Dewana Ammirati-Warren, Alda Lea, NP      I have reviewed the PDMP during this encounter.   Tish Men, NP 01/29/22 1711

## 2022-01-29 NOTE — ED Triage Notes (Signed)
States she is unable to lift left arm.  States she fell last Tuesday and has been unable to lift her arm since yesterday.  States she was seen by her kidney doctor today and was told to come to urgent care due to elevated heart rate.

## 2022-01-29 NOTE — Discharge Instructions (Addendum)
The x-ray does not show any new fracture or dislocation.  The x-ray does show osteoarthritis of the left shoulder. Apply medication as prescribed. Continue use of tramadol as needed for pain or discomfort.  If symptoms worsen, recommend following up with orthopedics for further evaluation. May apply ice or heat as needed.  Apply ice for pain or swelling, heat for spasm or stiffness.  Apply for 20 minutes, remove for 1 hour, then repeat is much as possible. Gentle stretching and range of motion exercises to help keep the joint mobile.  This will also help decrease your pain. If symptoms worsen or fail to improve, as discussed, it is recommend that you follow-up with your primary care physician or with orthopedics.  You can follow-up with [facility] at [phone] or with [facility] at [phone].  With regard to your elevated heart rate, if you develop chest pain, shortness of breath, difficulty breathing, or other concerns, please go to the emergency department immediately.  Also recommend that you follow-up with your cardiologist if symptoms do not improve.  Recommend follow-up within the next 48 to 72 hours.  Follow-up as needed.

## 2022-01-31 LAB — HEMOGLOBIN A1C
Hgb A1c MFr Bld: 8.8 % — ABNORMAL HIGH (ref 4.8–5.6)
Mean Plasma Glucose: 206 mg/dL

## 2022-02-01 ENCOUNTER — Telehealth: Payer: Self-pay | Admitting: Gastroenterology

## 2022-02-01 NOTE — Telephone Encounter (Signed)
Please let pt know that when she goes for labs next month for hematology/oncology at [facility], please take our lab orders with her. I have put in my labs, please mail to patient.

## 2022-02-02 NOTE — Telephone Encounter (Signed)
Pt was made aware and verbalized understanding. Orders were printed and mailed to pt.

## 2022-02-19 ENCOUNTER — Inpatient Hospital Stay: Payer: Medicare Other

## 2022-02-23 ENCOUNTER — Ambulatory Visit (INDEPENDENT_AMBULATORY_CARE_PROVIDER_SITE_OTHER): Payer: Medicare Other | Admitting: Orthopedic Surgery

## 2022-02-23 VITALS — BP 147/86 | HR 86 | Ht 68.0 in | Wt 234.0 lb

## 2022-02-23 DIAGNOSIS — M25512 Pain in left shoulder: Secondary | ICD-10-CM

## 2022-02-23 DIAGNOSIS — M7542 Impingement syndrome of left shoulder: Secondary | ICD-10-CM

## 2022-02-23 MED ORDER — METHYLPREDNISOLONE ACETATE 40 MG/ML IJ SUSP
40.0000 mg | Freq: Once | INTRAMUSCULAR | Status: AC
  Administered 2022-02-23: 40 mg via INTRA_ARTICULAR

## 2022-02-23 NOTE — Progress Notes (Signed)
Chief Complaint  Patient presents with   Shoulder Pain    Left shoulder pain, DOI 02-03-22.    HPI: This is a 72 year old female who fell back in December then a week later started having some pain and soreness in her left shoulder.  He became very stiff but with a combination of ice and heat and tramadol and active range of motion she got a lot of the stiffness out  Now the pain seems to come and go  She did have an x-ray at urgent care and it was negative for acute fracture although the glenohumeral joint had some noticeable arthritis  She presents now for evaluation and management Past Medical History:  Diagnosis Date   Anemia    Arthritis    Asthma    COPD (chronic obstructive pulmonary disease) ([place])    COVID-19    Deep vein thrombosis (DVT) of both lower extremities ([place]) 06/27/2015   Fibromyalgia    GERD (gastroesophageal reflux disease)    H/O hiatal hernia    Hypercholesteremia    Hypertension    Hyperthyroidism    IBS (irritable bowel syndrome)    Inappropriate sinus tachycardia    Inner ear disease    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   Type 2 diabetes mellitus (HCC)     BP (!) 147/86   Pulse 86   Ht '5\' 8"'$  (1.727 m)   Wt 234 lb (106.1 kg)   BMI 35.58 kg/m    General appearance: Well-developed well-nourished no gross deformities  Cardiovascular normal pulse and perfusion normal color without edema  Neurologically no sensation loss or deficits or pathologic reflexes  Psychological: Awake alert and oriented x3 mood and affect normal  Skin no lacerations or ulcerations no nodularity no palpable masses, no erythema or nodularity  Musculoskeletal:   Left shoulder Mild tenderness in the PERI acromial region Skin is intact no rash no erythema Rotator cuff strength in abduction and flexion are normal External rotation at 0 degrees abduction is also normal Active flexion 120  Impingement positive at 120    Imaging outside  imaging.  My interpretation of the images is that she has mild glenohumeral arthritis with no fracture  A/P  Impingement syndrome  Recommend subacromial injection  Procedure note the subacromial injection shoulder left   Verbal consent was obtained to inject the  Left   Shoulder  Timeout was completed to confirm the injection site is a subacromial space of the  left  shoulder  Medication used Depo-Medrol 40 mg and lidocaine 1% 3 cc  Anesthesia was provided by ethyl chloride  The injection was performed in the left  posterior subacromial space. After pinning the skin with alcohol and anesthetized the skin with ethyl chloride the subacromial space was injected using a 20-gauge needle. There were no complications  Sterile dressing was applied.          Home exercise program with [place] exercises should be sufficient

## 2022-02-23 NOTE — Addendum Note (Signed)
Addended byCandice Camp on: 02/23/2022 03:16 PM   Modules accepted: Orders

## 2022-02-26 ENCOUNTER — Ambulatory Visit: Payer: Medicare Other | Admitting: Physician Assistant

## 2022-03-03 ENCOUNTER — Encounter (HOSPITAL_COMMUNITY): Payer: Self-pay | Admitting: Hematology

## 2022-03-05 ENCOUNTER — Encounter (HOSPITAL_COMMUNITY): Payer: Self-pay | Admitting: Hematology

## 2022-03-10 ENCOUNTER — Ambulatory Visit (HOSPITAL_COMMUNITY)
Admission: RE | Admit: 2022-03-10 | Discharge: 2022-03-10 | Disposition: A | Discharge status: Home / Self Care | Payer: 59 | Source: Ambulatory Visit | Attending: Physician Assistant | Admitting: Physician Assistant

## 2022-03-10 ENCOUNTER — Inpatient Hospital Stay: Payer: 59 | Attending: Hematology

## 2022-03-10 DIAGNOSIS — D72825 Bandemia: Secondary | ICD-10-CM

## 2022-03-10 DIAGNOSIS — D509 Iron deficiency anemia, unspecified: Secondary | ICD-10-CM

## 2022-03-10 DIAGNOSIS — N1831 Chronic kidney disease, stage 3a: Secondary | ICD-10-CM | POA: Diagnosis not present

## 2022-03-10 DIAGNOSIS — D631 Anemia in chronic kidney disease: Secondary | ICD-10-CM

## 2022-03-10 DIAGNOSIS — D472 Monoclonal gammopathy: Secondary | ICD-10-CM | POA: Insufficient documentation

## 2022-03-10 DIAGNOSIS — I129 Hypertensive chronic kidney disease with stage 1 through stage 4 chronic kidney disease, or unspecified chronic kidney disease: Secondary | ICD-10-CM | POA: Insufficient documentation

## 2022-03-10 DIAGNOSIS — D72829 Elevated white blood cell count, unspecified: Secondary | ICD-10-CM | POA: Insufficient documentation

## 2022-03-10 DIAGNOSIS — E1122 Type 2 diabetes mellitus with diabetic chronic kidney disease: Secondary | ICD-10-CM | POA: Diagnosis not present

## 2022-03-10 DIAGNOSIS — Z86718 Personal history of other venous thrombosis and embolism: Secondary | ICD-10-CM | POA: Insufficient documentation

## 2022-03-10 LAB — CBC WITH DIFFERENTIAL/PLATELET
Abs Immature Granulocytes: 0.08 10*3/uL — ABNORMAL HIGH (ref 0.00–0.07)
Basophils Absolute: 0.1 10*3/uL (ref 0.0–0.1)
Basophils Relative: 0 %
Eosinophils Absolute: 0.5 10*3/uL (ref 0.0–0.5)
Eosinophils Relative: 3 %
HCT: 43.1 % (ref 36.0–46.0)
Hemoglobin: 13.6 g/dL (ref 12.0–15.0)
Immature Granulocytes: 1 %
Lymphocytes Relative: 19 %
Lymphs Abs: 2.6 10*3/uL (ref 0.7–4.0)
MCH: 26.4 pg (ref 26.0–34.0)
MCHC: 31.6 g/dL (ref 30.0–36.0)
MCV: 83.5 fL (ref 80.0–100.0)
Monocytes Absolute: 0.8 10*3/uL (ref 0.1–1.0)
Monocytes Relative: 6 %
Neutro Abs: 9.7 10*3/uL — ABNORMAL HIGH (ref 1.7–7.7)
Neutrophils Relative %: 71 %
Platelets: 238 10*3/uL (ref 150–400)
RBC: 5.16 MIL/uL — ABNORMAL HIGH (ref 3.87–5.11)
RDW: 13.7 % (ref 11.5–15.5)
WBC: 13.6 10*3/uL — ABNORMAL HIGH (ref 4.0–10.5)
nRBC: 0 % (ref 0.0–0.2)

## 2022-03-10 LAB — COMPREHENSIVE METABOLIC PANEL
ALT: 74 U/L — ABNORMAL HIGH (ref 0–44)
AST: 54 U/L — ABNORMAL HIGH (ref 15–41)
Albumin: 3 g/dL — ABNORMAL LOW (ref 3.5–5.0)
Alkaline Phosphatase: 356 U/L — ABNORMAL HIGH (ref 38–126)
Anion gap: 8 (ref 5–15)
BUN: 16 mg/dL (ref 8–23)
CO2: 23 mmol/L (ref 22–32)
Calcium: 8.5 mg/dL — ABNORMAL LOW (ref 8.9–10.3)
Chloride: 99 mmol/L (ref 98–111)
Creatinine, Ser: 1.24 mg/dL — ABNORMAL HIGH (ref 0.44–1.00)
GFR, Estimated: 47 mL/min — ABNORMAL LOW (ref >60)
Glucose, Bld: 335 mg/dL — ABNORMAL HIGH (ref 70–99)
Potassium: 4.3 mmol/L (ref 3.5–5.1)
Sodium: 130 mmol/L — ABNORMAL LOW (ref 135–145)
Total Bilirubin: 0.5 mg/dL (ref 0.3–1.2)
Total Protein: 8.8 g/dL — ABNORMAL HIGH (ref 6.5–8.1)

## 2022-03-10 LAB — FERRITIN: Ferritin: 129 ng/mL (ref 11–307)

## 2022-03-10 LAB — LACTATE DEHYDROGENASE: LDH: 167 U/L (ref 98–192)

## 2022-03-10 LAB — IRON AND TIBC
Iron: 97 ug/dL (ref 28–170)
Saturation Ratios: 35 % — ABNORMAL HIGH (ref 10.4–31.8)
TIBC: 274 ug/dL (ref 250–450)
UIBC: 177 ug/dL

## 2022-03-11 LAB — KAPPA/LAMBDA LIGHT CHAINS
Kappa free light chain: 790.4 mg/L — ABNORMAL HIGH (ref 3.3–19.4)
Kappa, lambda light chain ratio: 41.6 — ABNORMAL HIGH (ref 0.26–1.65)
Lambda free light chains: 19 mg/L (ref 5.7–26.3)

## 2022-03-12 ENCOUNTER — Ambulatory Visit: Payer: Medicare Other | Admitting: Neurology

## 2022-03-13 LAB — PROTEIN ELECTROPHORESIS, SERUM
A/G Ratio: 0.6 — ABNORMAL LOW (ref 0.7–1.7)
Albumin ELP: 3 g/dL (ref 2.9–4.4)
Alpha-1-Globulin: 0.3 g/dL (ref 0.0–0.4)
Alpha-2-Globulin: 1 g/dL (ref 0.4–1.0)
Beta Globulin: 1.1 g/dL (ref 0.7–1.3)
Gamma Globulin: 2.5 g/dL — ABNORMAL HIGH (ref 0.4–1.8)
Globulin, Total: 4.9 g/dL — ABNORMAL HIGH (ref 2.2–3.9)
M-Spike, %: 2 g/dL — ABNORMAL HIGH
Total Protein ELP: 7.9 g/dL (ref 6.0–8.5)

## 2022-03-14 LAB — BCR-ABL1 FISH
Cells Analyzed: 200
Cells Counted: 200

## 2022-03-16 NOTE — Progress Notes (Unsigned)
Stephanie [name], [place] [address]   CLINIC:  Medical Oncology/Hematology  PCP:  [name], [place] [address] [phone]   REASON FOR VISIT:  Follow-up for MGUS, history of DVT, leukocytosis, microcytic anemia  CURRENT THERAPY: Eliquis for DVT.  Surveillance.  INTERVAL HISTORY:   Stephanie [name] 72 y.o. 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  She was last seen by Tarri Abernethy PA-C on 10/16/2021.  At today's visit, patient reports increased low back pain.  She reports that she has chronic muscle spasms in her low back but has had worsening low back pain after she fell in December and reports new radiating pain and numbness/tingling going down her left leg.  She continues to have significant chronic fatigue that is unchanged.  She reports that her chronic left hand numbness is slightly worse than usual.  She has not noticed any new lumps or bumps.  She continues to take Eliquis for history of DVT.  She denies any current signs or symptoms of DVT or PE.  Chronic dyspnea on exertion is stable at baseline.  She denies any major bleeding events such as epistaxis, hematemesis, hematochezia, or melena.   She has 25% energy and 50% appetite. She endorses that she is maintaining a stable weight.  ASSESSMENT & PLAN:  1.  IgG kappa MGUS: - Bone marrow biopsy on 09/03/2015 showing 9% plasma cells and negative skeletal survey. - History of nephrotic range proteinuria, resolved now. - Skeletal survey (03/10/2022): Possible mottled lucency of the anterior aspect of the L1 vertebral body and mildly increased lucency of the inferior endplate of L2, requires additional dedicated cross-sectional imaging for better characterization - Most recent myeloma panel (03/10/2022) continues to show labs trending upwards: M-spike trending upwards at 2.0% (significantly elevated from M spike around  1.3 1 year ago) Free light chains remain elevated but relatively stable with elevated kappa 790.4, normal lambda 19.0, and stable but elevated ratio 41.60. No CRAB features: Calcium 8.5.  Creatinine 1.24 (baseline CKD stage IIIa from diabetes and hypertension). Normal Hgb 13.6. LDH normal - Reports increased low back pain.  She reports that she has chronic muscle spasms in her low back but has had worsening low back pain after she fell in December 2023 and reports new radiating pain and numbness/tingling going down her left leg.   No B symptoms. - PLAN: Due to upwards-trending MGUS labs and questionable findings on skeletal survey, recommend proceeding with bone marrow biopsy. - Dedicated CT of lumbar spine to characterize possible lucencies - RTC 1 week after bone marrow biopsy and CT spine  2.  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 - She is taking Eliquis without any major bleeding events      - PLAN: Continue Eliquis.   3.  Leukocytosis: - Intermittent leukocytosis since at least 2012 - Denies any recent fevers, B symptoms, or infections      - Denies any chronic steroid use.   - She is a non-smoker - BCR-ABL FISH is negative.  JAK2 with reflex is PENDING. - Most recent labs (03/10/2022): WBC 13.6 with ANC 9.7 - PLAN: Suspect intermittent reactive leukocytosis in the setting of obesity and high burden of chronic disease.  Will follow results of JAK2 with reflex and discuss at upcoming appointment.   4.  Microcytic anemia, RESOLVED - Previously known to be iron  deficient. - Last received IV iron on 06/25/2017 and 07/16/2017. - Not currently on iron supplements. - No bright red blood per rectum or melena - Most recent labs (03/10/2022): Hgb 13.6/MCV 83.5, ferritin 129, iron saturation 35 % - PLAN: Continue iron rich diet.  We will continue surveillance.   5. CKD stage IIIa - Renal biopsy (08/29/2021) consistent with kidney disease from  diabetes and hypertension - Baseline creatinine 1.1-1.4 - PLAN: Continue follow-up with Dr. Theador Hawthorne   6.  Elevated liver enzymes: - Ultrasound abdomen on 04/25/2018 showed fatty liver. - She has chronically elevated AST, ALT, and alkaline phosphatase - Liver biopsy (10/15/2020): Minimally active steatohepatitis, mild portal and centrilobular fibrosis - PLAN: Continue follow up with GI (NP Roseanne Kaufman)   PLAN SUMMARY: >> IR bone marrow biopsy at [facility] >> CT scan of lumbar spine (without contrast) >> OFFICE visit 1 week after bone marrow biopsy and CT scan  ** Per patient request, we will block release of bone marrow biopsy results and CT scan results for "manual release only."  Patient is concerned that she may get "bad news" and would prefer to receive this news in person.     REVIEW OF SYSTEMS:   Review of Systems  Constitutional:  Positive for fatigue. Negative for appetite change, chills, diaphoresis, fever and unexpected weight change.  HENT:   Positive for trouble swallowing. Negative for lump/mass and nosebleeds.   Eyes:  Negative for eye problems.  Respiratory:  Positive for shortness of breath. Negative for cough and hemoptysis.   Cardiovascular:  Positive for palpitations. Negative for chest pain and leg swelling.  Gastrointestinal:  Positive for constipation (IBS). Negative for abdominal pain, blood in stool, diarrhea, nausea and vomiting.  Genitourinary:  Negative for hematuria.   Musculoskeletal:  Positive for back pain.  Skin: Negative.   Neurological:  Positive for numbness. Negative for dizziness, headaches and light-headedness.  Hematological:  Does not bruise/bleed easily.  Psychiatric/Behavioral:  Positive for sleep disturbance.      PHYSICAL EXAM:  ECOG PERFORMANCE STATUS: 2 - Symptomatic, <50% confined to bed  There were no vitals filed for this visit. There were no vitals filed for this visit. Physical Exam Constitutional:      Appearance: Normal  appearance. She is obese.     Comments: Presents in wheelchair today  HENT:     Head: Normocephalic and atraumatic.     Mouth/Throat:     Mouth: Mucous membranes are moist.  Eyes:     Extraocular Movements: Extraocular movements intact.     Pupils: Pupils are equal, round, and reactive to light.  Cardiovascular:     Rate and Rhythm: Normal rate and regular rhythm.     Pulses: Normal pulses.     Heart sounds: Normal heart sounds.  Pulmonary:     Effort: Pulmonary effort is normal.     Breath sounds: Decreased air movement present. Decreased breath sounds present.  Abdominal:     General: Bowel sounds are normal.     Palpations: Abdomen is soft.     Tenderness: There is no abdominal tenderness.  Musculoskeletal:        General: No swelling.     Right lower leg: Edema (2+) present.     Left lower leg: Edema (2+) present.  Lymphadenopathy:     Cervical: No cervical adenopathy.  Skin:    General: Skin is warm and dry.  Neurological:     General: No focal deficit present.     Mental Status:  She is alert and oriented to person, place, and time.  Psychiatric:        Mood and Affect: Mood normal.        Behavior: Behavior normal.     PAST MEDICAL/SURGICAL HISTORY:  Past Medical History:  Diagnosis Date   Anemia    Arthritis    Asthma    COPD (chronic obstructive pulmonary disease) (HCC)    COVID-19    Deep vein thrombosis (DVT) of both lower extremities ([place]) 06/27/2015   Fibromyalgia    GERD (gastroesophageal reflux disease)    H/O hiatal hernia    Hypercholesteremia    Hypertension    Hyperthyroidism    IBS (irritable bowel syndrome)    Inappropriate sinus tachycardia    Inner ear disease    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   Type 2 diabetes mellitus ([place])    Past Surgical History:  Procedure Laterality Date   ABDOMINAL HYSTERECTOMY  partial   CARPAL TUNNEL RELEASE Right 1991   CATARACT EXTRACTION W/PHACO Right 05/08/2013    Procedure: CATARACT EXTRACTION PHACO AND INTRAOCULAR LENS PLACEMENT ([place]);  Surgeon: Tonny Branch, MD;  Location: [facility];  Service: Ophthalmology;  Laterality: Right;  CDE 10.31   CATARACT EXTRACTION W/PHACO Left 08/17/2013   Procedure: CATARACT EXTRACTION PHACO AND INTRAOCULAR LENS PLACEMENT ([facility]);  Surgeon: Tonny Branch, MD;  Location: [facility];  Service: Ophthalmology;  Laterality: Left;  CDE:9.03   CHOLECYSTECTOMY  1971   COLONOSCOPY WITH PROPOFOL N/A 01/06/2016   Dr. Gala Romney: diverticulosis    DENTAL SURGERY     ESOPHAGEAL BRUSHING  08/29/2019   Procedure: ESOPHAGEAL BRUSHING;  Surgeon: Daneil Dolin, MD;  Location: [facility];  Service: Endoscopy;;   ESOPHAGOGASTRODUODENOSCOPY (EGD) WITH PROPOFOL N/A 01/06/2016   Dr. Gala Romney: normal s/p empiric dilation    ESOPHAGOGASTRODUODENOSCOPY (EGD) WITH PROPOFOL N/A 08/29/2019   esophageal plaques vs medication residue adherent to tubular esophagus s/p KOH brushing and dilation. Medium-sized hiatal hernia. + for candida. Diflucan.    EYE SURGERY     MALONEY DILATION N/A 01/06/2016   Procedure: Venia Minks DILATION;  Surgeon: Daneil Dolin, MD;  Location: [facility];  Service: Endoscopy;  Laterality: N/A;   MALONEY DILATION N/A 08/29/2019   Procedure: Venia Minks DILATION;  Surgeon: Daneil Dolin, MD;  Location: [facility];  Service: Endoscopy;  Laterality: N/A;   REVERSE SHOULDER ARTHROPLASTY Right 08/06/2017   Procedure: RIGHT REVERSE SHOULDER ARTHROPLASTY;  Surgeon: Netta Cedars, MD;  Location: [facility];  Service: Orthopedics;  Laterality: Right;   WRIST GANGLION EXCISION Left     SOCIAL HISTORY:  Social History   Socioeconomic History   Marital status: Married    Spouse name: Not on file   Number of children: Not on file   Years of education: Not on file   Highest education level: Not on file  Occupational History   Not on file  Tobacco Use   Smoking status: Former    Packs/day: 0.25    Years: 30.00    Total pack years: 7.50    Types:  Cigarettes    Quit date: 02/17/2011    Years since quitting: 11.0   Smokeless tobacco: Never  Vaping Use   Vaping Use: Never used  Substance and Sexual Activity   Alcohol use: Not Currently    Comment: rare   Drug use: No   Sexual activity: Not Currently    Birth control/protection: Surgical  Other Topics Concern   Not on file  Social History  Narrative   Not on file   Social Determinants of Health   Financial Resource Strain: Not on file  Food Insecurity: Not on file  Transportation Needs: Not on file  Physical Activity: Not on file  Stress: Not on file  Social Connections: Not on file  Intimate Partner Violence: Not on file    FAMILY HISTORY:  Family History  Problem Relation Age of Onset   Hypertension Mother    Diabetes Mother    COPD Mother    Arthritis Mother    Diabetes Father    Arthritis Father    Dementia Father    CAD Father    Hypothyroidism Sister    Diabetes Brother    Colon cancer Niece    Colon polyps Neg Hx     CURRENT MEDICATIONS:  Outpatient Encounter Medications as of 03/17/2022  Medication Sig   Accu-Chek FastClix Lancets MISC Apply topically.   ACCU-CHEK GUIDE test strip 4 (four) times daily.   acetaminophen (TYLENOL) 500 MG tablet Take 1,000 mg by mouth every 6 (six) hours as needed for moderate pain or headache.   albuterol (PROVENTIL) (2.5 MG/3ML) 0.083% nebulizer solution Take 2.5 mg by nebulization every 6 (six) hours as needed for wheezing or shortness of breath.   albuterol (VENTOLIN HFA) 108 (90 Base) MCG/ACT inhaler Inhale 1-2 puffs into the lungs every 6 (six) hours as needed for wheezing or shortness of breath.   Alcohol Swabs (ALCOHOL PADS) 70 % PADS SMARTSIG:Pledget(s) Topical 4 Times Daily   apixaban (ELIQUIS) 5 MG TABS tablet Take 1 tablet (5 mg total) by mouth 2 (two) times daily.   ARNUITY ELLIPTA 100 MCG/ACT AEPB Inhale 1 puff into the lungs at bedtime.    Ascorbic Acid (VITAMIN C) 1000 MG tablet Take 500 mg by mouth daily.    atorvastatin (LIPITOR) 80 MG tablet Take 1 tablet (80 mg total) by mouth daily.   bisoprolol (ZEBETA) 10 MG tablet TAKE 1 TABLET(10 MG) BY MOUTH IN THE MORNING AND AT BEDTIME   Blood Glucose Calibration (ACCU-CHEK GUIDE CONTROL) LIQD See admin instructions.   Blood Glucose Monitoring Suppl (ACCU-CHEK GUIDE ME) w/Device KIT 4 (four) times daily.   Cetirizine HCl 10 MG CAPS Take 10 mg by mouth daily.   Cholecalciferol (VITAMIN D) 2000 units tablet Take 4,000 Units by mouth daily.    cimetidine (TAGAMET) 200 MG tablet Take 0.5 tablets (100 mg total) by mouth daily as needed. (Patient taking differently: Take 100 mg by mouth daily as needed (indigestion).)   CINNAMON PO Take 1-2 capsules by mouth 3 (three) times daily.   clobetasol ointment (TEMOVATE) 9.50 % Apply 1 application topically 3 (three) times daily as needed (imflammation).   diphenhydrAMINE (BENADRYL) 25 mg capsule Take 50 mg by mouth every 4 (four) hours as needed for itching.   DULoxetine (CYMBALTA) 30 MG capsule Take 1 capsule (30 mg total) by mouth 2 (two) times daily.   EASY COMFORT PEN NEEDLES 31G X 5 MM MISC INJECT IN SULIN EVERY DAY AS DIRECTED   empagliflozin (JARDIANCE) 10 MG TABS tablet Take 1 tablet (10 mg total) by mouth daily.   fluticasone (VERAMYST) 27.5 MCG/SPRAY nasal spray Place 2 sprays into the nose daily.   guaiFENesin (ROBITUSSIN) 100 MG/5ML SOLN Take 5 mLs (100 mg total) by mouth every 4 (four) hours as needed for cough or to loosen phlegm.   HUMALOG KWIKPEN 100 UNIT/ML KwikPen Inject 5-11 Units into the skin 3 (three) times daily. Sliding Scale   hydrOXYzine (ATARAX/VISTARIL) 25  MG tablet Take 25 mg by mouth every 8 (eight) hours as needed for itching.    insulin degludec (TRESIBA FLEXTOUCH) 100 UNIT/ML FlexTouch Pen Inject 20 Units into the skin at bedtime.   ipratropium-albuterol (DUONEB) 0.5-2.5 (3) MG/3ML SOLN Take 3 mLs by nebulization every 6 (six) hours as needed (shortness of breath).   Lancet Devices  (EASY MINI EJECT LANCING DEVICE) MISC 4 (four) times daily.   levothyroxine (SYNTHROID) 88 MCG tablet Take 88 mcg by mouth daily before breakfast.   lidocaine (LIDODERM) 5 % Place 1 patch onto the skin daily. Remove & Discard patch within 12 hours or as directed by MD   lubiprostone (AMITIZA) 24 MCG capsule Take 1 capsule (24 mcg total) by mouth 2 (two) times daily with a meal.   LYRICA 75 MG capsule Take 75 mg by mouth 2 (two) times daily.    methocarbamol (ROBAXIN) 500 MG tablet Take 1 tablet (500 mg total) by mouth 3 (three) times daily as needed. (Patient taking differently: Take 500 mg by mouth 3 (three) times daily as needed for muscle spasms.)   montelukast (SINGULAIR) 10 MG tablet Take 10 mg by mouth at bedtime.   omega-3 acid ethyl esters (LOVAZA) 1 g capsule Take 2 capsules (2 g total) by mouth 2 (two) times daily.   pantoprazole (PROTONIX) 40 MG tablet Take 1 tablet (40 mg total) by mouth daily before breakfast.   potassium chloride (KLOR-CON) 10 MEQ tablet Take 10 mEq by mouth daily.   REPATHA SURECLICK 778 MG/ML SOAJ ADMINISTER 1 ML UNDER THE SKIN EVERY 14 DAYS   RESTASIS 0.05 % ophthalmic emulsion Place 1 drop into both eyes 2 (two) times daily.   tirzepatide (MOUNJARO) 7.5 MG/0.5ML Pen Inject 7.5 mg into the skin once a week.   torsemide (DEMADEX) 20 MG tablet Take 2 tablets (40 mg total) by mouth daily.   traMADol (ULTRAM) 50 MG tablet Take 50 mg by mouth 3 (three) times daily as needed for moderate pain.   traZODone (DESYREL) 50 MG tablet Take 50-100 mg by mouth at bedtime as needed for sleep.   vitamin B-12 (CYANOCOBALAMIN) 1000 MCG tablet Take 1,000 mcg by mouth daily.   No facility-administered encounter medications on file as of 03/17/2022.    ALLERGIES:  Allergies  Allergen Reactions   Tetracyclines & Related Anaphylaxis and Rash   Banana Hives and Nausea And Vomiting   Penicillins Rash and Other (See Comments)    Has patient had a PCN reaction causing immediate rash,  facial/tongue/throat swelling, SOB or lightheadedness with hypotension: No Has patient had a PCN reaction causing severe rash involving mucus membranes or skin necrosis: No Has patient had a PCN reaction that required hospitalization No Has patient had a PCN reaction occurring within the last 10 years: No If all of the above answers are "NO", then may proceed with Cephalosporin use.     LABORATORY DATA:  I have reviewed the labs as listed.  CBC    Component Value Date/Time   WBC 13.6 (H) 03/10/2022 1414   RBC 5.16 (H) 03/10/2022 1414   HGB 13.6 03/10/2022 1414   HCT 43.1 03/10/2022 1414   PLT 238 03/10/2022 1414   MCV 83.5 03/10/2022 1414   MCH 26.4 03/10/2022 1414   MCHC 31.6 03/10/2022 1414   RDW 13.7 03/10/2022 1414   LYMPHSABS 2.6 03/10/2022 1414   MONOABS 0.8 03/10/2022 1414   EOSABS 0.5 03/10/2022 1414   BASOSABS 0.1 03/10/2022 1414      Latest Ref  Rng & Units 03/10/2022    2:14 PM 01/29/2022   12:30 PM 10/16/2021    1:40 PM  CMP  Glucose 70 - 99 mg/dL 335  351  157   BUN 8 - 23 mg/dL '16  17  29   '$ Creatinine 0.44 - 1.00 mg/dL 1.24  1.31  1.55   Sodium 135 - 145 mmol/L 130  134  136   Potassium 3.5 - 5.1 mmol/L 4.3  3.7  4.1   Chloride 98 - 111 mmol/L 99  104  107   CO2 22 - 32 mmol/L '23  22  24   '$ Calcium 8.9 - 10.3 mg/dL 8.5  8.9  8.6   Total Protein 6.5 - 8.1 g/dL 8.8  8.7    Total Bilirubin 0.3 - 1.2 mg/dL 0.5  0.5    Alkaline Phos 38 - 126 U/L 356  351    AST 15 - 41 U/L 54  46    ALT 0 - 44 U/L 74  59      DIAGNOSTIC IMAGING:  I have independently reviewed the relevant imaging and discussed with the patient.   WRAP UP:  All questions were answered. The patient knows to call the clinic with any problems, questions or concerns.  Medical decision making: Moderate  Time spent on visit: I spent 20 minutes counseling the patient face to face. The total time spent in the appointment was 30 minutes and more than 50% was on counseling.  Harriett Rush,  PA-C  03/17/2022 3:55 PM

## 2022-03-17 ENCOUNTER — Encounter: Payer: Self-pay | Admitting: Physician Assistant

## 2022-03-17 ENCOUNTER — Inpatient Hospital Stay (HOSPITAL_BASED_OUTPATIENT_CLINIC_OR_DEPARTMENT_OTHER): Payer: 59 | Admitting: Physician Assistant

## 2022-03-17 VITALS — BP 124/91 | HR 96 | Temp 97.7°F | Resp 19 | Ht 68.0 in | Wt 230.0 lb

## 2022-03-17 DIAGNOSIS — D509 Iron deficiency anemia, unspecified: Secondary | ICD-10-CM | POA: Diagnosis not present

## 2022-03-17 DIAGNOSIS — N183 Chronic kidney disease, stage 3 unspecified: Secondary | ICD-10-CM

## 2022-03-17 DIAGNOSIS — R937 Abnormal findings on diagnostic imaging of other parts of musculoskeletal system: Secondary | ICD-10-CM | POA: Diagnosis not present

## 2022-03-17 DIAGNOSIS — D631 Anemia in chronic kidney disease: Secondary | ICD-10-CM

## 2022-03-17 DIAGNOSIS — D72825 Bandemia: Secondary | ICD-10-CM

## 2022-03-17 DIAGNOSIS — D472 Monoclonal gammopathy: Secondary | ICD-10-CM

## 2022-03-17 NOTE — Patient Instructions (Signed)
[place] at [facility] Discharge Instructions  You were seen today by Tarri Abernethy PA-C for your MGUS (abnormal protein) and other blood problems.  MGUS: Your abnormal immunoglobulin protein is continuing to slowly increase.  You also had some questionable spots in your low back on your most recent x-rays.  We will check BONE MARROW BIOPSY and CT SCAN OF LOW BACK to get a better idea of what is going on with your MGUS and to make sure you do not have any evidence of progressing to myeloma.  HISTORY OF BLOOD CLOTS: Continue to take Eliquis for your history of blood clots.  Seek immediate medical attention if you have any major bleeding events while taking Eliquis.  ELEVATED WHITE BLOOD CELLS: Your white blood cells are mildly elevated but stable.  These are likely a reaction to inflammation.  ANEMIA: Your blood and iron levels look good at today's visit.  You do not need any IV iron or iron pills at this time.  Continue to eat iron rich vegetables and other iron rich foods in your diet.  FOLLOW-UP APPOINTMENT: Office visit 1 week after bone marrow biopsy and CT scan  ** Thank you for trusting me with your healthcare!  I strive to provide all of my patients with quality care at each visit.  If you receive a survey for this visit, I would be so grateful to you for taking the time to provide feedback.  Thank you in advance!  ~ Corinthian Kemler                   Dr. Derek Jack   &   Tarri Abernethy, PA-C   - - - - - - - - - - - - - - - - - -     Thank you for choosing [place] at [facility] to provide your oncology and hematology care.  To afford each patient quality time with our provider, please arrive at least 15 minutes before your scheduled appointment time.   If you have a lab appointment with the [facility] please come in thru the Main Entrance and check in at the main information desk.  You need to re-schedule your  appointment should you arrive 10 or more minutes late.  We strive to give you quality time with our providers, and arriving late affects you and other patients whose appointments are after yours.  Also, if you no show three or more times for appointments you may be dismissed from the clinic at the providers discretion.     Again, thank you for choosing [facility].  Our hope is that these requests will decrease the amount of time that you wait before being seen by our physicians.       _____________________________________________________________  Should you have questions after your visit to [facility], please contact our office at [phone] and follow the prompts.  Our office hours are 8:00 a.m. and 4:30 p.m. Monday - Friday.  Please note that voicemails left after 4:00 p.m. may not be returned until the following business day.  We are closed weekends and major holidays.  You do have access to a nurse 24-7, just call the main number to the clinic [phone] and do not press any options, hold on the line and a nurse will answer the phone.    For prescription refill requests, have your pharmacy contact our office and allow 72 hours.  Due to Covid, you will need to wear a mask upon entering the hospital. If you do not have a mask, a mask will be given to you at the Main Entrance upon arrival. For doctor visits, patients may have 1 support person age 24 or older with them. For treatment visits, patients can not have anyone with them due to social distancing guidelines and our immunocompromised population.

## 2022-03-18 LAB — CALR +MPL + E12-E15  (REFLEX)

## 2022-03-18 LAB — JAK2 V617F RFX CALR/MPL/E12-15

## 2022-03-24 ENCOUNTER — Ambulatory Visit: Payer: Medicare Other | Admitting: Nurse Practitioner

## 2022-03-24 ENCOUNTER — Telehealth: Payer: Self-pay | Admitting: *Deleted

## 2022-03-24 DIAGNOSIS — I10 Essential (primary) hypertension: Secondary | ICD-10-CM

## 2022-03-24 DIAGNOSIS — N1832 Chronic kidney disease, stage 3b: Secondary | ICD-10-CM

## 2022-03-24 DIAGNOSIS — E782 Mixed hyperlipidemia: Secondary | ICD-10-CM

## 2022-03-24 NOTE — Telephone Encounter (Signed)
Call not going through, I sent her a mychart message

## 2022-03-24 NOTE — Telephone Encounter (Signed)
Patient called and left a message. She has a 2 pm appointment today and she woke up sick with a stomach virus. She ask that we cancel her appointment and call her for another one, later. [phone]

## 2022-03-29 ENCOUNTER — Other Ambulatory Visit: Payer: Self-pay | Admitting: Radiology

## 2022-03-29 DIAGNOSIS — D472 Monoclonal gammopathy: Secondary | ICD-10-CM

## 2022-03-31 ENCOUNTER — Ambulatory Visit (HOSPITAL_COMMUNITY)
Admission: RE | Admit: 2022-03-31 | Discharge: 2022-03-31 | Disposition: A | Discharge status: Home / Self Care | Payer: 59 | Source: Ambulatory Visit | Attending: Physician Assistant | Admitting: Physician Assistant

## 2022-03-31 ENCOUNTER — Other Ambulatory Visit: Payer: Self-pay

## 2022-03-31 ENCOUNTER — Encounter (HOSPITAL_COMMUNITY): Payer: Self-pay

## 2022-03-31 DIAGNOSIS — D472 Monoclonal gammopathy: Secondary | ICD-10-CM

## 2022-03-31 DIAGNOSIS — Z1379 Encounter for other screening for genetic and chromosomal anomalies: Secondary | ICD-10-CM | POA: Diagnosis not present

## 2022-03-31 DIAGNOSIS — R937 Abnormal findings on diagnostic imaging of other parts of musculoskeletal system: Secondary | ICD-10-CM

## 2022-03-31 DIAGNOSIS — D72829 Elevated white blood cell count, unspecified: Secondary | ICD-10-CM | POA: Insufficient documentation

## 2022-03-31 HISTORY — PX: IR BONE MARROW BIOPSY & ASPIRATION: IMG5727

## 2022-03-31 LAB — CBC WITH DIFFERENTIAL/PLATELET
Abs Immature Granulocytes: 0.15 10*3/uL — ABNORMAL HIGH (ref 0.00–0.07)
Basophils Absolute: 0.1 10*3/uL (ref 0.0–0.1)
Basophils Relative: 1 %
Eosinophils Absolute: 0.4 10*3/uL (ref 0.0–0.5)
Eosinophils Relative: 3 %
HCT: 43.6 % (ref 36.0–46.0)
Hemoglobin: 13.6 g/dL (ref 12.0–15.0)
Immature Granulocytes: 1 %
Lymphocytes Relative: 26 %
Lymphs Abs: 3.5 10*3/uL (ref 0.7–4.0)
MCH: 26.1 pg (ref 26.0–34.0)
MCHC: 31.2 g/dL (ref 30.0–36.0)
MCV: 83.7 fL (ref 80.0–100.0)
Monocytes Absolute: 1 10*3/uL (ref 0.1–1.0)
Monocytes Relative: 7 %
Neutro Abs: 8.1 10*3/uL — ABNORMAL HIGH (ref 1.7–7.7)
Neutrophils Relative %: 62 %
Platelets: 273 10*3/uL (ref 150–400)
RBC: 5.21 MIL/uL — ABNORMAL HIGH (ref 3.87–5.11)
RDW: 14.2 % (ref 11.5–15.5)
WBC: 13.2 10*3/uL — ABNORMAL HIGH (ref 4.0–10.5)
nRBC: 0 % (ref 0.0–0.2)

## 2022-03-31 LAB — GLUCOSE, CAPILLARY: Glucose-Capillary: 220 mg/dL — ABNORMAL HIGH (ref 70–99)

## 2022-03-31 MED ORDER — MIDAZOLAM HCL 2 MG/2ML IJ SOLN
INTRAMUSCULAR | Status: AC
  Filled 2022-03-31: qty 2

## 2022-03-31 MED ORDER — LIDOCAINE HCL (PF) 1 % IJ SOLN
INTRAMUSCULAR | Status: AC
  Administered 2022-03-31: 10 mL
  Filled 2022-03-31: qty 30

## 2022-03-31 MED ORDER — FENTANYL CITRATE (PF) 100 MCG/2ML IJ SOLN
INTRAMUSCULAR | Status: AC
  Filled 2022-03-31: qty 2

## 2022-03-31 MED ORDER — FENTANYL CITRATE (PF) 100 MCG/2ML IJ SOLN
INTRAMUSCULAR | Status: AC | PRN
  Administered 2022-03-31 (×2): 50 ug via INTRAVENOUS

## 2022-03-31 MED ORDER — SODIUM CHLORIDE 0.9 % IV SOLN: INTRAVENOUS | Status: DC

## 2022-03-31 MED ORDER — MIDAZOLAM HCL 2 MG/2ML IJ SOLN
INTRAMUSCULAR | Status: AC | PRN
  Administered 2022-03-31 (×2): 1 mg via INTRAVENOUS

## 2022-03-31 NOTE — Procedures (Signed)
Interventional Radiology Procedure Note  Procedure: Image guided aspirate and core biopsy of right posterior iliac bone Complications: None Recommendations: - Bedrest supine x 1 hrs - OTC's PRN  Pain - Follow biopsy results  Signed,  Dulcy Fanny. Earleen Newport, DO

## 2022-03-31 NOTE — Discharge Instructions (Signed)
Bone Marrow Aspiration and Bone Marrow Biopsy, Adult, Care After  The following information offers guidance on how to care for yourself after your procedure. Your health care provider may also give you more specific instructions. If you have problems or questions, contact your health care provider.  What can I expect after the procedure?  May remove dressing or bandaid and shower tomorrow.  Keep site clean and dry. Replace with clean dressing or bandaid as necessary. Urgent needs IR clinic [phone] (mon-fri 8-5).  After the procedure, it is common to have: Mild pain and tenderness. Swelling. Bruising. Follow these instructions at home: Incision care  Follow instructions from your health care provider about how to take care of the incision site. Make sure you: Wash your hands with soap and water for at least 20 seconds before and after you change your bandage (dressing). If soap and water are not available, use hand sanitizer. Change your dressing as told by your health care provider. Leave stitches (sutures), skin glue, or adhesive strips in place. These skin closures may need to stay in place for 2 weeks or longer. If adhesive strip edges start to loosen and curl up, you may trim the loose edges. Do not remove adhesive strips completely unless your health care provider tells you to do that. Check your incision site every day for signs of infection. Check for: More redness, swelling, or pain. Fluid or blood. Warmth. Pus or a bad smell. Activity Return to your normal activities as told by your health care provider. Ask your health care provider what activities are safe for you. Do not lift anything that is heavier than 10 lb (4.5 kg), or the limit that you are told, until your health care provider says that it is safe. If you were given a sedative during the procedure, it can affect you for  several hours. Do not drive or operate machinery until your health care provider says that it is safe. General instructions  Take over-the-counter and prescription medicines only as told by your health care provider. Do not take baths, swim, or use a hot tub until your health care provider approves. Ask your health care provider if you may take showers. You may only be allowed to take sponge baths. If directed, put ice on the affected area. To do this: Put ice in a plastic bag. Place a towel between your skin and the bag. Leave the ice on for 20 minutes, 2-3 times a day. If your skin turns bright red, remove the ice right away to prevent skin damage. The risk of skin damage is higher if you cannot feel pain, heat, or cold. Contact a health care provider if: You have signs of infection. Your pain is not controlled with medicine. You have cancer, and a temperature of 100.29F (38C) or higher. Get help right away if: You have a temperature of 101F (38.3C) or higher, or as told by your health care provider. You have bleeding from the incision site that cannot be controlled. This information is not intended to replace advice given to you by your health care provider. Make sure you discuss any questions you have with your health care provider. Document Revised: 06/09/2021 Document Reviewed: 06/09/2021 Elsevier Patient Education  [place].                            Moderate Conscious Sedation, Adult, Care After  This sheet gives you information about how to care  for yourself after your procedure. Your health care provider may also give you more specific instructions. If you have problems or questions, contact your health care provider. What can I expect after the procedure? After the procedure, it is common to have: Sleepiness for several hours. Impaired judgment for several hours. Difficulty with balance. Vomiting if you eat too soon. Follow these instructions at home: For  the time period you were told by your health care provider:     Rest. Do not participate in activities where you could fall or become injured. Do not drive or use machinery. Do not drink alcohol. Do not take sleeping pills or medicines that cause drowsiness. Do not make important decisions or sign legal documents. Do not take care of children on your own. Eating and drinking  Follow the diet recommended by your health care provider. Drink enough fluid to keep your urine pale yellow. If you vomit: Drink water, juice, or soup when you can drink without vomiting. Make sure you have little or no nausea before eating solid foods. General instructions Take over-the-counter and prescription medicines only as told by your health care provider. Have a responsible adult stay with you for the time you are told. It is important to have someone help care for you until you are awake and alert. Do not smoke. Keep all follow-up visits as told by your health care provider. This is important. Contact a health care provider if: You are still sleepy or having trouble with balance after 24 hours. You feel light-headed. You keep feeling nauseous or you keep vomiting. You develop a rash. You have a fever. You have redness or swelling around the IV site. Get help right away if: You have trouble breathing. You have new-onset confusion at home. Summary After the procedure, it is common to feel sleepy, have impaired judgment, or feel nauseous if you eat too soon. Rest after you get home. Know the things you should not do after the procedure. Follow the diet recommended by your health care provider and drink enough fluid to keep your urine pale yellow. Get help right away if you have trouble breathing or new-onset confusion at home. This information is not intended to replace advice given to you by your health care provider. Make sure you discuss any questions you have with your health care  provider. Document Revised: 06/02/2019 Document Reviewed: 12/29/2018 Elsevier Patient Education  [place].

## 2022-03-31 NOTE — Sedation Documentation (Addendum)
Sample obtained

## 2022-03-31 NOTE — Sedation Documentation (Signed)
Sample obtained

## 2022-03-31 NOTE — H&P (Signed)
Chief Complaint: Patient was seen in consultation today for bone marrow bx   Referring Physician(s): Pennington,Rebekah M PA-C  Supervising Physician: Corrie Mckusick  Patient Status: [facility] - Out-pt  History of Present Illness: Stephanie [name] is a 72 y.o. female being worked up for monoclonal gammopathy concerning for progression to myeloma.  She is referred for bone marrow biopsy. PMHx, meds, labs, imaging, allergies reviewed. Feels well, no recent fevers, chills, illness. Has been NPO today as directed.   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   Type 2 diabetes mellitus ([place])     Past Surgical History:  Procedure Laterality Date   ABDOMINAL HYSTERECTOMY  partial   CARPAL TUNNEL RELEASE Right 1991   CATARACT EXTRACTION W/PHACO Right 05/08/2013   Procedure: CATARACT EXTRACTION PHACO AND INTRAOCULAR LENS PLACEMENT ([place]);  Surgeon: Tonny Branch, MD;  Location: [facility] OR[facility];  Service: Ophthalmology;  Laterality: Right;  CDE 10.31   CATARACT EXTRACTION W/PHACO Left 08/17/2013   Procedure: CATARACT EXTRACTION PHACO AND INTRAOCULAR LENS PLACEMENT ([facility]);  Surgeon: Tonny Branch, MD;  Location: [facility];  Service: Ophthalmology;  Laterality: Left;  CDE:9.03   CHOLECYSTECTOMY  1971   COLONOSCOPY WITH PROPOFOL N/A 01/06/2016   Dr. Gala Romney: diverticulosis    DENTAL SURGERY     ESOPHAGEAL BRUSHING  08/29/2019   Procedure: ESOPHAGEAL BRUSHING;  Surgeon: Daneil Dolin, MD;  Location: [facility];  Service: Endoscopy;;    ESOPHAGOGASTRODUODENOSCOPY (EGD) WITH PROPOFOL N/A 01/06/2016   Dr. Gala Romney: normal s/p empiric dilation    ESOPHAGOGASTRODUODENOSCOPY (EGD) WITH PROPOFOL N/A 08/29/2019   esophageal plaques vs medication residue adherent to tubular esophagus s/p KOH brushing and dilation. Medium-sized hiatal hernia. + for candida. Diflucan.    EYE SURGERY     MALONEY DILATION N/A 01/06/2016   Procedure: Venia Minks DILATION;  Surgeon: Daneil Dolin, MD;  Location: [facility];  Service: Endoscopy;  Laterality: N/A;   MALONEY DILATION N/A 08/29/2019   Procedure: Venia Minks DILATION;  Surgeon: Daneil Dolin, MD;  Location: [facility];  Service: Endoscopy;  Laterality: N/A;   REVERSE SHOULDER ARTHROPLASTY Right 08/06/2017   Procedure: RIGHT REVERSE SHOULDER ARTHROPLASTY;  Surgeon: Netta Cedars, MD;  Location: [place];  Service: Orthopedics;  Laterality: Right;   WRIST GANGLION EXCISION Left     Allergies: Tetracyclines & related, Banana, and Penicillins  Medications: Prior to Admission medications   Medication Sig Start Date End Date Taking? Authorizing Provider  Accu-Chek FastClix Lancets MISC Apply topically. 03/15/20   [provider]  ACCU-CHEK GUIDE test strip 4 (four) times daily. 07/02/20   [provider]  acetaminophen (TYLENOL) 500 MG tablet Take 1,000 mg by mouth every 6 (six) hours as needed for moderate pain or headache.    [provider]  albuterol (PROVENTIL) (2.5 MG/3ML) 0.083% nebulizer solution Take 2.5 mg by nebulization every 6 (six) hours as needed for wheezing or shortness of breath.    [provider]  albuterol (VENTOLIN  HFA) 108 (90 Base) MCG/ACT inhaler Inhale 1-2 puffs into the lungs every 6 (six) hours as needed for wheezing or shortness of breath. 03/21/21   Volney American, PA-C  Alcohol Swabs (ALCOHOL PADS) 70 % PADS SMARTSIG:Pledget(s) Topical 4 Times Daily 08/12/20   [provider]  apixaban (ELIQUIS) 5 MG TABS tablet Take 1 tablet  (5 mg total) by mouth 2 (two) times daily. 05/21/21   Arrien, Jimmy Picket, MD  ARNUITY ELLIPTA 100 MCG/ACT AEPB Inhale 1 puff into the lungs at bedtime.  06/13/17   [provider]  Ascorbic Acid (VITAMIN C) 1000 MG tablet Take 500 mg by mouth daily.    [provider]  atorvastatin (LIPITOR) 80 MG tablet Take 1 tablet (80 mg total) by mouth daily. 05/22/21 06/24/58  Arrien, Jimmy Picket, MD  bisoprolol (ZEBETA) 10 MG tablet TAKE 1 TABLET(10 MG) BY MOUTH IN THE MORNING AND AT BEDTIME 12/12/21   Strader, [place] M, PA-C  Blood Glucose Calibration (ACCU-CHEK GUIDE CONTROL) LIQD See admin instructions. 07/02/20   [provider]  Blood Glucose Monitoring Suppl (ACCU-CHEK GUIDE ME) w/Device KIT 4 (four) times daily. 07/02/20   [provider]  Cetirizine HCl 10 MG CAPS Take 10 mg by mouth daily.    [provider]  Cholecalciferol (VITAMIN D) 2000 units tablet Take 4,000 Units by mouth daily.     [provider]  cimetidine (TAGAMET) 200 MG tablet Take 0.5 tablets (100 mg total) by mouth daily as needed. Patient taking differently: Take 100 mg by mouth daily as needed (indigestion). 03/22/20   Barton Dubois, MD  CINNAMON PO Take 1-2 capsules by mouth 3 (three) times daily.    [provider]  clobetasol ointment (TEMOVATE) [id number] % Apply 1 application topically 3 (three) times daily as needed (imflammation). 06/20/15   [provider]  diphenhydrAMINE (BENADRYL) 25 mg capsule Take 50 mg by mouth every 4 (four) hours as needed for itching.    [provider]  DULoxetine (CYMBALTA) 30 MG capsule Take 1 capsule (30 mg total) by mouth 2 (two) times daily. Patient taking differently: Take 30 mg by mouth 2 (two) times daily. Take 2 capsules twice a day 09/16/17   Kayleen Memos, DO  EASY COMFORT PEN NEEDLES 31G X 5 MM MISC INJECT IN SULIN EVERY DAY AS DIRECTED 06/02/21   [provider]  empagliflozin (JARDIANCE) 10 MG TABS  tablet Take 1 tablet (10 mg total) by mouth daily. 11/20/21   Brita Romp, NP  fluticasone (VERAMYST) 27.5 MCG/SPRAY nasal spray Place 2 sprays into the nose daily.    [provider]  guaiFENesin (ROBITUSSIN) 100 MG/5ML SOLN Take 5 mLs (100 mg total) by mouth every 4 (four) hours as needed for cough or to loosen phlegm. 03/17/20   Manuella Ghazi, Pratik D, DO  HUMALOG KWIKPEN 100 UNIT/ML KwikPen Inject 5-11 Units into the skin 3 (three) times daily. Sliding Scale 12/22/21   Brita Romp, NP  hydrOXYzine (ATARAX/VISTARIL) 25 MG tablet Take 25 mg by mouth every 8 (eight) hours as needed for itching.  06/20/15   [provider]  insulin degludec (TRESIBA FLEXTOUCH) 100 UNIT/ML FlexTouch Pen Inject 20 Units into the skin at bedtime. 12/22/21   Brita Romp, NP  ipratropium-albuterol (DUONEB) 0.5-2.5 (3) MG/3ML SOLN Take 3 mLs by nebulization every 6 (six) hours as needed (shortness of breath). 05/03/21   [provider]  Lancet Devices (EASY MINI EJECT LANCING DEVICE) MISC 4 (four) times daily. 07/02/20  [provider]  levothyroxine (SYNTHROID) 88 MCG tablet Take 88 mcg by mouth daily before breakfast.    [provider]  lidocaine (LIDODERM) 5 % Place 1 patch onto the skin daily. Remove & Discard patch within 12 hours or as directed by MD 01/29/22   Leath-Warren, Alda Lea, NP  lubiprostone (AMITIZA) 24 MCG capsule Take 1 capsule (24 mcg total) by mouth 2 (two) times daily with a meal. 01/21/22   Mahala Menghini, PA-C  LYRICA 75 MG capsule Take 75 mg by mouth 2 (two) times daily.  07/18/15   [provider]  methocarbamol (ROBAXIN) 500 MG tablet Take 1 tablet (500 mg total) by mouth 3 (three) times daily as needed. Patient taking differently: Take 500 mg by mouth 3 (three) times daily as needed for muscle spasms. 08/06/17   Netta Cedars, MD  montelukast (SINGULAIR) 10 MG tablet Take 10 mg by mouth at bedtime. 04/02/21   [provider]   omega-3 acid ethyl esters (LOVAZA) 1 g capsule Take 2 capsules (2 g total) by mouth 2 (two) times daily. 05/21/21 06/24/58  Arrien, Jimmy Picket, MD  pantoprazole (PROTONIX) 40 MG tablet Take 1 tablet (40 mg total) by mouth daily before breakfast. 01/21/22   Mahala Menghini, PA-C  potassium chloride (KLOR-CON) 10 MEQ tablet Take 10 mEq by mouth daily.    [provider]  REPATHA SURECLICK [id number] MG/ML SOAJ ADMINISTER 1 ML UNDER THE SKIN EVERY 14 DAYS 01/05/22   Garvin Fila, MD  RESTASIS 0.05 % ophthalmic emulsion Place 1 drop into both eyes 2 (two) times daily. 08/21/20   [provider]  tirzepatide Darcel Bayley) 7.5 MG/0.5ML Pen Inject 7.5 mg into the skin once a week. 12/22/21   Brita Romp, NP  torsemide (DEMADEX) 20 MG tablet Take 2 tablets (40 mg total) by mouth daily. 07/02/15   Samuella Cota, MD  traMADol (ULTRAM) 50 MG tablet Take 50 mg by mouth 3 (three) times daily as needed for moderate pain. 01/07/18   [provider]  traZODone (DESYREL) 50 MG tablet Take 50-100 mg by mouth at bedtime as needed for sleep.    [provider]  vitamin B-12 (CYANOCOBALAMIN) 1000 MCG tablet Take 1,000 mcg by mouth daily.    [provider]     Family History  Problem Relation Age of Onset   Hypertension Mother    Diabetes Mother    COPD Mother    Arthritis Mother    Diabetes Father    Arthritis Father    Dementia Father    CAD Father    Hypothyroidism Sister    Diabetes Brother    Colon cancer Niece    Colon polyps Neg Hx     Social History   Socioeconomic History   Marital status: Married    Spouse name: Not on file   Number of children: Not on file   Years of education: Not on file   Highest education level: Not on file  Occupational History   Not on file  Tobacco Use   Smoking status: Former    Packs/day: 0.25    Years: 30.00    Total pack years: 7.50    Types: Cigarettes    Quit date: 02/17/2011    Years since quitting: 11.1    Smokeless tobacco: Never  Vaping Use   Vaping Use: Never used  Substance and Sexual Activity   Alcohol use: Not Currently    Comment: rare   Drug use:  No   Sexual activity: Not Currently    Birth control/protection: Surgical  Other Topics Concern   Not on file  Social History Narrative   Not on file   Social Determinants of Health   Financial Resource Strain: Not on file  Food Insecurity: Not on file  Transportation Needs: Not on file  Physical Activity: Not on file  Stress: Not on file  Social Connections: Not on file    Review of Systems: A 12 point ROS discussed and pertinent positives are indicated in the HPI above.  All other systems are negative.  Review of Systems  Vital Signs: T: 98.3 HR: 83 BP: 131/68 RR: 18 O2: 98%  Physical Exam Constitutional:      Appearance: Normal appearance.  HENT:     Mouth/Throat:     Mouth: Mucous membranes are moist.     Pharynx: Oropharynx is clear.  Cardiovascular:     Rate and Rhythm: Normal rate and regular rhythm.     Heart sounds: Normal heart sounds.  Pulmonary:     Effort: Pulmonary effort is normal. No respiratory distress.     Breath sounds: Normal breath sounds.  Skin:    General: Skin is warm and dry.  Neurological:     General: No focal deficit present.     Mental Status: She is alert and oriented to person, place, and time.  Psychiatric:        Mood and Affect: Mood normal.        Thought Content: Thought content normal.        Judgment: Judgment normal.     Imaging: DG Bone Survey Met  Result Date: 03/13/2022 CLINICAL DATA:  MGUS labs trending upwards - concern for developing myeloma EXAM: METASTATIC BONE SURVEY COMPARISON:  January 30, 2021, February 13, 2020 FINDINGS: Lateral radiograph of the skull demonstrate no new suspicious lytic lesion. Upper extremities: Status post RIGHT shoulder arthroplasty. Orthopedic hardware is intact and without periprosthetic fracture or lucency. Moderate  degenerative changes of the acromioclavicular joints bilaterally. Degenerative changes of the LEFT glenohumeral joint. No acute fracture or dislocation. No suspicious lytic lesion is identified. Spine and pelvis: Multilevel uncovertebral and facet arthropathy throughout the cervical spine. Intervertebral disc space height loss is most pronounced in the lower cervical spine. No discrete lytic lesion is identified in the cervical or thoracic spine. Multilevel endplate proliferative changes throughout the thoracic spine. No acute compression fracture deformities noted. Similar appearance of grade 1 anterolisthesis of L4-5 and L3-4 with severe intervertebral disc space height loss at these levels. Mild retrolisthesis of L5-S1, similar in comparison to prior. Severe facet arthropathy. Mildly increased lucency along the inferior endplate of L2, possibly degenerative. Query mottled lucency of the anterior aspect of the L1 vertebral body. Pelvic phleboliths. Limited assessment of the sacrum secondary to overlapping bowel contents. Atherosclerotic calcifications. Chest: Remote rib fractures. Atherosclerotic calcifications of the aorta. No pleural effusion or pneumothorax. No acute pleuroparenchymal abnormality. IMPRESSION: Query mottled lucency of the anterior aspect of the L1 vertebral body and mildly increased lucency of the inferior endplate of L2. This may be due to summation artifact or degenerative changes but given concern for worsening MGUS lab values, recommend further dedicated evaluation with cross-sectional imaging Electronically Signed   By: Valentino Saxon M.D.   On: 03/13/2022 08:53    Labs:  CBC: Recent Labs    05/18/21 0956 08/29/21 0640 10/08/21 1236 03/10/22 1414  WBC 9.2 12.8* 14.2* 13.6*  HGB 13.0 13.6 13.1 13.6  HCT 40.9  43.3 42.7 43.1  PLT 244 250 203 238    COAGS: Recent Labs    05/17/21 0023 08/29/21 0640  INR 1.1 1.0  APTT 32  --     BMP: Recent Labs    10/08/21 1236  10/16/21 1340 01/29/22 1230 03/10/22 1414  NA 139 136 134* 130*  K 4.2 4.1 3.7 4.3  CL 110 107 104 99  CO2 25 24 22 23  $ GLUCOSE 170* 157* 351* 335*  BUN 30* 29* 17 16  CALCIUM 8.9 8.6* 8.9 8.5*  CREATININE 1.28* 1.55* 1.31* 1.24*  GFRNONAA 45* 36* 44* 47*    LIVER FUNCTION TESTS: Recent Labs    07/24/21 1341 10/08/21 1236 01/29/22 1230 03/10/22 1414  BILITOT 0.7 0.6 0.5 0.5  AST 42* 69* 46* 54*  ALT 57* 94* 59* 74*  ALKPHOS 384* 399* 351* 356*  PROT 8.3* 8.7* 8.7* 8.8*  ALBUMIN 2.9* 2.9* 3.1* 3.0*     Assessment and Plan: Monoclonal gammopathy For image guided bone marrow biopsy Risks and benefits of bone marrow bx was discussed with the patient and/or patient's family including, but not limited to bleeding, infection, damage to adjacent structures or low yield requiring additional tests.  All of the questions were answered and there is agreement to proceed.  Consent signed and in chart.    Electronically Signed: Ascencion Dike, PA-C 03/31/2022, 7:52 AM   I spent a total of 20 minutes in face to face in clinical consultation, greater than 50% of which was counseling/coordinating care for bone marrow bx

## 2022-04-02 LAB — SURGICAL PATHOLOGY

## 2022-04-03 NOTE — Telephone Encounter (Signed)
Patient did not have my labs done when she had labs for Dr. Raliegh Ip.   Can we ask her to complete labs. The ones ordered 02/01/22.

## 2022-04-06 NOTE — Telephone Encounter (Signed)
Pt was made aware and verbalized understanding. Lab orders were put in the mail for the pt.

## 2022-04-07 ENCOUNTER — Ambulatory Visit (HOSPITAL_COMMUNITY)
Admission: RE | Admit: 2022-04-07 | Discharge: 2022-04-07 | Disposition: A | Discharge status: Home / Self Care | Payer: 59 | Source: Ambulatory Visit | Attending: Physician Assistant | Admitting: Physician Assistant

## 2022-04-07 DIAGNOSIS — D472 Monoclonal gammopathy: Secondary | ICD-10-CM | POA: Diagnosis not present

## 2022-04-07 DIAGNOSIS — R937 Abnormal findings on diagnostic imaging of other parts of musculoskeletal system: Secondary | ICD-10-CM | POA: Insufficient documentation

## 2022-04-08 ENCOUNTER — Encounter: Payer: Self-pay | Admitting: Cardiology

## 2022-04-08 ENCOUNTER — Ambulatory Visit: Payer: 59 | Attending: Cardiology | Admitting: Cardiology

## 2022-04-08 VITALS — BP 144/88 | HR 92 | Ht 69.0 in | Wt 230.0 lb

## 2022-04-08 DIAGNOSIS — N1832 Chronic kidney disease, stage 3b: Secondary | ICD-10-CM | POA: Diagnosis not present

## 2022-04-08 DIAGNOSIS — E782 Mixed hyperlipidemia: Secondary | ICD-10-CM

## 2022-04-08 DIAGNOSIS — I10 Essential (primary) hypertension: Secondary | ICD-10-CM

## 2022-04-08 DIAGNOSIS — I429 Cardiomyopathy, unspecified: Secondary | ICD-10-CM

## 2022-04-08 NOTE — Patient Instructions (Signed)
Medication Instructions:  Your physician recommends that you continue on your current medications as directed. Please refer to the Current Medication list given to you today.   Labwork: None  Testing/Procedures: None  Follow-Up: Follow up with Dr. McDowell in 6 months.   Any Other Special Instructions Will Be Listed Below (If Applicable).     If you need a refill on your cardiac medications before your next appointment, please call your pharmacy.  

## 2022-04-08 NOTE — Progress Notes (Signed)
Cardiology Office Note  Date: 04/08/2022   ID: Stephanie [name], Stephanie [name] 1950/04/18, MRN [id number]  PCP:  [name], FNP  Cardiologist:  Rozann Lesches, MD Electrophysiologist:  None   Chief Complaint  Patient presents with   Cardiac follow-up    History of Present Illness: Stephanie [name] is a 72 y.o. female last seen in August 2023 by Ms. Strader PA-C, I reviewed the note.  She is here for a routine visit.  Reports NYHA class II dyspnea, no palpitations or chest pain.  She has been having trouble with lower back pain.  She has history of MGUS and prior DVT, follows with hematology oncology, I reviewed the note from January.  Last echocardiogram in August 2023 revealed LVEF low normal at 50%, RV contraction normal.  We discussed this today, this was a relative improvement in her LVEF.  Blood pressure was elevated today, but she states that she checks it at home with systolics generally in the 120s to 130s on average.  She is on telmisartan which was started by Dr. Theador Hawthorne in addition to her remaining cardiac medications which are noted below.  Lipids looked good in December 2023, LDL was 52 at that time.  Last creatinine was 1.24.  Past Medical History:  Diagnosis Date   Anemia    Arthritis    Asthma    COPD (chronic obstructive pulmonary disease) ([place])    COVID-19    Deep vein thrombosis (DVT) of both lower extremities ([place]) 06/27/2015   Fibromyalgia    GERD (gastroesophageal reflux disease)    H/O hiatal hernia    Hypercholesteremia    Hypertension    Hyperthyroidism    IBS (irritable bowel syndrome)    Inappropriate sinus tachycardia    Inner ear disease    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   Type 2 diabetes mellitus (H[facility])     Current Outpatient Medications  Medication Sig Dispense Refill   Accu-Chek FastClix Lancets MISC Apply topically.     ACCU-CHEK GUIDE test strip 4 (four) times daily.     acetaminophen (TYLENOL) 500 MG tablet  Take 1,000 mg by mouth every 6 (six) hours as needed for moderate pain or headache.     albuterol (PROVENTIL) (2.5 MG/3ML) 0.083% nebulizer solution Take 2.5 mg by nebulization every 6 (six) hours as needed for wheezing or shortness of breath.     albuterol (VENTOLIN HFA) 108 (90 Base) MCG/ACT inhaler Inhale 1-2 puffs into the lungs every 6 (six) hours as needed for wheezing or shortness of breath. 18 g 0   Alcohol Swabs (ALCOHOL PADS) 70 % PADS SMARTSIG:Pledget(s) Topical 4 Times Daily     apixaban (ELIQUIS) 5 MG TABS tablet Take 1 tablet (5 mg total) by mouth 2 (two) times daily.     ARNUITY ELLIPTA 100 MCG/ACT AEPB Inhale 1 puff into the lungs at bedtime.   11   Ascorbic Acid (VITAMIN C) 1000 MG tablet Take 500 mg by mouth daily.     atorvastatin (LIPITOR) 80 MG tablet Take 1 tablet (80 mg total) by mouth daily. 30 tablet 0   bisoprolol (ZEBETA) 10 MG tablet TAKE 1 TABLET(10 MG) BY MOUTH IN THE MORNING AND AT BEDTIME 180 tablet 3   Blood Glucose Calibration (ACCU-CHEK GUIDE CONTROL) LIQD See admin instructions.     Blood Glucose Monitoring Suppl (ACCU-CHEK GUIDE ME) w/Device KIT 4 (four) times daily.     Cetirizine HCl 10 MG CAPS Take 10 mg by mouth daily.  Cholecalciferol (VITAMIN D) 2000 units tablet Take 4,000 Units by mouth daily.      cimetidine (TAGAMET) 200 MG tablet Take 0.5 tablets (100 mg total) by mouth daily as needed. (Patient taking differently: Take 100 mg by mouth daily as needed (indigestion).)     CINNAMON PO Take 1-2 capsules by mouth 3 (three) times daily.     clobetasol ointment (TEMOVATE) [id number] % Apply 1 application topically 3 (three) times daily as needed (imflammation).     diphenhydrAMINE (BENADRYL) 25 mg capsule Take 50 mg by mouth every 4 (four) hours as needed for itching.     DULoxetine (CYMBALTA) 60 MG capsule Take 60 mg by mouth 2 (two) times daily.     EASY COMFORT PEN NEEDLES 31G X 5 MM MISC INJECT IN SULIN EVERY DAY AS DIRECTED     empagliflozin (JARDIANCE)  10 MG TABS tablet Take 1 tablet (10 mg total) by mouth daily. 90 tablet 3   fluticasone (VERAMYST) 27.5 MCG/SPRAY nasal spray Place 2 sprays into the nose daily.     guaiFENesin (ROBITUSSIN) 100 MG/5ML SOLN Take 5 mLs (100 mg total) by mouth every 4 (four) hours as needed for cough or to loosen phlegm. 236 mL 0   HUMALOG KWIKPEN 100 UNIT/ML KwikPen Inject 5-11 Units into the skin 3 (three) times daily. Sliding Scale (Patient taking differently: Inject 3 Units into the skin 3 (three) times daily. Sliding Scale) 30 mL 3   hydrOXYzine (ATARAX/VISTARIL) 25 MG tablet Take 25 mg by mouth every 8 (eight) hours as needed for itching.      insulin degludec (TRESIBA FLEXTOUCH) 100 UNIT/ML FlexTouch Pen Inject 20 Units into the skin at bedtime. 18 mL 3   ipratropium-albuterol (DUONEB) 0.5-2.5 (3) MG/3ML SOLN Take 3 mLs by nebulization every 6 (six) hours as needed (shortness of breath).     Lancet Devices (EASY MINI EJECT LANCING DEVICE) MISC 4 (four) times daily.     levothyroxine (SYNTHROID) 88 MCG tablet Take 88 mcg by mouth daily before breakfast.     lidocaine (LIDODERM) 5 % Place 1 patch onto the skin daily. Remove & Discard patch within 12 hours or as directed by MD 30 patch 0   lubiprostone (AMITIZA) 24 MCG capsule Take 1 capsule (24 mcg total) by mouth 2 (two) times daily with a meal. 60 capsule 11   LYRICA 75 MG capsule Take 75 mg by mouth 2 (two) times daily.      methocarbamol (ROBAXIN) 500 MG tablet Take 1 tablet (500 mg total) by mouth 3 (three) times daily as needed. (Patient taking differently: Take 500 mg by mouth 3 (three) times daily as needed for muscle spasms.) 60 tablet 1   montelukast (SINGULAIR) 10 MG tablet Take 10 mg by mouth at bedtime.     omega-3 acid ethyl esters (LOVAZA) 1 g capsule Take 2 capsules (2 g total) by mouth 2 (two) times daily. 120 capsule 0   pantoprazole (PROTONIX) 40 MG tablet Take 1 tablet (40 mg total) by mouth daily before breakfast. 90 tablet 3   potassium  chloride (KLOR-CON) 10 MEQ tablet Take 10 mEq by mouth daily.     REPATHA SURECLICK [id number] MG/ML SOAJ ADMINISTER 1 ML UNDER THE SKIN EVERY 14 DAYS 2 mL 3   RESTASIS 0.05 % ophthalmic emulsion Place 1 drop into both eyes 2 (two) times daily.     telmisartan (MICARDIS) 20 MG tablet Take 40 mg by mouth daily.     tirzepatide (MOUNJARO) 7.5 MG/0.5ML Pen Inject  7.5 mg into the skin once a week. 6 mL 3   torsemide (DEMADEX) 20 MG tablet Take 2 tablets (40 mg total) by mouth daily. 60 tablet 0   traMADol (ULTRAM) 50 MG tablet Take 50 mg by mouth 3 (three) times daily as needed for moderate pain.  1   traZODone (DESYREL) 50 MG tablet Take 150 mg by mouth at bedtime as needed for sleep.     vitamin B-12 (CYANOCOBALAMIN) 1000 MCG tablet Take 1,000 mcg by mouth daily.     No current facility-administered medications for this visit.   Allergies:  Tetracyclines & related, Banana, and Penicillins   ROS: No syncope.  Physical Exam: VS:  BP (!) 144/88   Pulse 92   Ht 5' 9"$  (1.753 m)   Wt 230 lb (104.3 kg)   SpO2 93%   BMI 33.97 kg/m , BMI Body mass index is 33.97 kg/m.  Wt Readings from Last 3 Encounters:  04/08/22 230 lb (104.3 kg)  03/31/22 230 lb (104.3 kg)  03/17/22 230 lb (104.3 kg)    General: Patient appears comfortable at rest. HEENT: Conjunctiva and lids normal. Neck: Supple, no elevated JVP or carotid bruits. Lungs: Clear to auscultation, nonlabored breathing at rest. Cardiac: Regular rate and rhythm, no S3 or significant systolic murmur. Extremities: No pitting edema.  ECG:  An ECG dated 01/29/2022 was personally reviewed today and demonstrated:  Sinus tachycardia with LVH and repolarization abnormalities, left anterior fascicular block.  Recent Labwork: 05/17/2021: B Natriuretic Peptide 1,488.0 01/29/2022: TSH 1.934 03/10/2022: ALT 74; AST 54; BUN 16; Creatinine, Ser 1.24; Potassium 4.3; Sodium 130 03/31/2022: Hemoglobin 13.6; Platelets 273     Component Value Date/Time   CHOL 150  01/29/2022 1231   CHOL 210 (H) 09/09/2021 1141   TRIG 130 01/29/2022 1231   HDL 72 01/29/2022 1231   HDL 63 09/09/2021 1141   CHOLHDL 2.1 01/29/2022 1231   VLDL 26 01/29/2022 1231   LDLCALC 52 01/29/2022 1231   LDLCALC 125 (H) 09/09/2021 1141    Other Studies Reviewed Today:  Echocardiogram 10/06/2021:  1. Left ventricular ejection fraction, by estimation, is 50%. The left  ventricle has low normal function. The left ventricle has no regional wall  motion abnormalities. There is mild left ventricular hypertrophy.   2. Right ventricular systolic function is normal. The right ventricular  size is normal.   3. The inferior vena cava is normal in size with greater than 50%  respiratory variability, suggesting right atrial pressure of 3 mmHg.   4. Limited echo to evaluate LV function   Assessment and Plan:  1.  History of cardiomyopathy with low normal LVEF by most recent echocardiogram in August 2023, 50% at that time.  She is clinically stable with NYHA class II dyspnea.  Presently on bisoprolol, Jardiance, Mounjaro, telmisartan, and Demadex.  No changes were made today.  2.  History of recurrent lower extremity DVT, chronically anticoagulated with Eliquis and followed by the hematology clinic.  3.  Mixed hyperlipidemia, doing well on Lipitor 80 mg daily, most recent LDL was down to 52.  4.  Essential hypertension, continue to track blood pressure at home on current regimen.  No changes were made today, can be considered if blood pressure trend increases.  5.  CKD stage IIIb, she follows with nephrology.  Recent creatinine 1.24 with normal potassium.  Medication Adjustments/Labs and Tests Ordered: Current medicines are reviewed at length with the patient today.  Concerns regarding medicines are outlined above.   Tests  Ordered: No orders of the defined types were placed in this encounter.   Medication Changes: No orders of the defined types were placed in this  encounter.   Disposition:  Follow up  6 months.  Signed, Satira Sark, MD, [facility] 04/08/2022 4:28 PM    [place] [facility] [address] [place],  [address] Phone: [phone]; Fax: [phone]

## 2022-04-09 ENCOUNTER — Encounter (HOSPITAL_COMMUNITY): Payer: Self-pay

## 2022-04-09 ENCOUNTER — Telehealth: Payer: Self-pay | Admitting: Orthopedic Surgery

## 2022-04-09 NOTE — Telephone Encounter (Signed)
Returned the patient's call, she is wanting to see Dr. Aline Brochure for a pinched nerve in her back.  She has had treatment and xrays at [facility].  I've advised to get [place] the notes and CD for Dr. Aline Brochure to review and advise.

## 2022-04-13 ENCOUNTER — Encounter (HOSPITAL_COMMUNITY): Payer: Self-pay

## 2022-04-13 NOTE — Progress Notes (Unsigned)
Stephanie [name], [place] [id number]   CLINIC:  Medical Oncology/Hematology  PCP:  [name], [place] [address] [phone]   REASON FOR VISIT:  Follow-up for MGUS, history of DVT, leukocytosis, microcytic anemia  CURRENT THERAPY: Eliquis for DVT.  Surveillance.  INTERVAL HISTORY:   Stephanie [name] 72 y.o. 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  She was last seen by Tarri Abernethy PA-C on 03/17/2022.  She returns today to discuss results of bone marrow biopsy.  She denies any changes in her symptoms since her visit 4 weeks ago.  She continues to experience low back pain with radiation down her left leg associated with left leg numbness and tingling.  Her chronic fatigue remains unchanged.  She continues to have left hand numbness. She has not noticed any new lumps or bumps.  She continues to take Eliquis for history of DVT.  She denies any current signs or symptoms of DVT or PE.  Chronic dyspnea on exertion is stable at baseline.  She denies any major bleeding events such as epistaxis, hematemesis, hematochezia, or melena.   She has little to no energy and 70% appetite. She endorses that she is maintaining a stable weight.  ASSESSMENT & PLAN:  1.  IgG kappa smoldering myeloma: - Bone marrow biopsy on 09/03/2015 showing 9% plasma cells and negative skeletal survey. - History of nephrotic range proteinuria, resolved now. - Skeletal survey (03/10/2022): Possible mottled lucency of the anterior aspect of the L1 vertebral body and mildly increased lucency of the inferior endplate of L2, requires additional dedicated cross-sectional imaging for better characterization - Most recent myeloma panel (03/10/2022) continues to show labs trending upwards: M-spike trending upwards at 2.0 (significantly elevated from M spike around 1.3 one year ago) Free light chains remain elevated but  relatively stable with elevated kappa 790.4, normal lambda 19.0, and stable but elevated ratio 41.60. No CRAB features: Calcium 8.5.  Creatinine 1.24 (baseline CKD stage IIIa from diabetes and hypertension). Normal Hgb 13.6. LDH normal - Reports increased low back pain.  She reports that she has chronic muscle spasms in her low back but has had worsening low back pain after she fell in December 2023 and reports new radiating pain and numbness/tingling going down her left leg.   No B symptoms. - Bone marrow biopsy (03/31/2022): Hypercellular bone marrow for age with plasmacytosis (plasma cells 13% of all cells).  Minor B-cell population with kappa light chain excess.  Per pathology report, unclear whether findings represent lymphoid component of a neoplastic lymphoplasmacytic disorder or an independent disease process such as monoclonal B-cell lymphocytosis. Molecular pathology was negative for any abnormal mutations Cytogenetics showed normal female karyotype (46,XX[20]) - CT lumbar spine (04/07/2022): Lytic lesion along superior endplate of L2 vertebral body and may be degenerative in etiology.  No definite suspicious lytic or blastic osseous lesions.   - Based on most recent bone marrow biopsy results, patient now meets criteria for SMOLDERING MYELOMA - PLAN: We will check lumbar spine MRI to confirm degenerative nature of lytic lesion.  (CALL WITH RESULTS) - We will ask pathology to check for MYD88 mutation on bone marrow sample, to differentiate between myeloma/plasma cell dyscrasia and lymphoplasmacytic lymphoma - MGUS/myeloma panel in 3 months with office visit 1 week after - The above findings and plan were discussed with Dr. Delton Coombes (04/14/2022), who agrees with the above  2.  Bilateral DVT: -  History of bilateral leg DVT in May 2017, thought to be unprovoked. - She is taking Eliquis without any major bleeding events      - PLAN: Continue Eliquis.   3.  Leukocytosis: - Intermittent  leukocytosis since at least 2012 - Denies any recent fevers, B symptoms, or infections      - Denies any chronic steroid use.   - She is a non-smoker - BCR-ABL FISH is negative.  JAK2 with reflex is negative - Most recent labs (03/10/2022): WBC 13.6 with ANC 9.7 - PLAN: Suspect intermittent reactive leukocytosis in the setting of obesity and high burden of chronic disease.  MPN workup negative.   4.  Microcytic anemia, RESOLVED - Previously known to be iron deficient. - Last received IV iron on 06/25/2017 and 07/16/2017. - Not currently on iron supplements. - No bright red blood per rectum or melena - Most recent labs (03/10/2022): Hgb 13.6/MCV 83.5, ferritin 129, iron saturation 35 % - PLAN: Continue iron rich diet.  We will continue surveillance.   5. CKD stage IIIa - Renal biopsy (08/29/2021) consistent with kidney disease from diabetes and hypertension - Baseline creatinine 1.1-1.4 - PLAN: Continue follow-up with Dr. Theador Hawthorne   6.  Elevated liver enzymes: - Ultrasound abdomen on 04/25/2018 showed fatty liver. - She has chronically elevated AST, ALT, and alkaline phosphatase - Liver biopsy (10/15/2020): Minimally active steatohepatitis, mild portal and centrilobular fibrosis - PLAN: Continue follow up with GI (NP Roseanne Kaufman)   PLAN SUMMARY: >> MRI lumbar spine (nurse will CALL with results) >> Labs in 3 months = CBC/D, CMP, LDH, immunofixation, SPEP >> OFFICE visit in 3 months (1 week after labs)     REVIEW OF SYSTEMS:   Review of Systems  Constitutional:  Positive for fatigue. Negative for appetite change, chills, diaphoresis, fever and unexpected weight change.  HENT:   Positive for trouble swallowing. Negative for lump/mass and nosebleeds.   Eyes:  Negative for eye problems.  Respiratory:  Positive for cough and shortness of breath. Negative for hemoptysis.   Cardiovascular:  Positive for palpitations. Negative for chest pain and leg swelling.  Gastrointestinal:  Positive for  constipation (IBS) and diarrhea. Negative for abdominal pain, blood in stool, nausea and vomiting.  Genitourinary:  Negative for hematuria.   Musculoskeletal:  Positive for back pain.  Skin: Negative.   Neurological:  Positive for numbness. Negative for dizziness, headaches and light-headedness.  Hematological:  Does not bruise/bleed easily.  Psychiatric/Behavioral:  Positive for depression and sleep disturbance. The patient is nervous/anxious.      PHYSICAL EXAM:  ECOG PERFORMANCE STATUS: 2 - Symptomatic, <50% confined to bed  There were no vitals filed for this visit. There were no vitals filed for this visit. Physical Exam Constitutional:      Appearance: Normal appearance. She is obese.     Comments: Presents in wheelchair today  HENT:     Head: Normocephalic and atraumatic.     Mouth/Throat:     Mouth: Mucous membranes are moist.  Eyes:     Extraocular Movements: Extraocular movements intact.     Pupils: Pupils are equal, round, and reactive to light.  Cardiovascular:     Rate and Rhythm: Normal rate and regular rhythm.     Pulses: Normal pulses.     Heart sounds: Normal heart sounds.  Pulmonary:     Effort: Pulmonary effort is normal.     Breath sounds: Decreased air movement present. Decreased breath sounds present.  Abdominal:  General: Bowel sounds are normal.     Palpations: Abdomen is soft.     Tenderness: There is no abdominal tenderness.  Musculoskeletal:        General: No swelling.     Right lower leg: Edema (2+) present.     Left lower leg: Edema (2+) present.  Lymphadenopathy:     Cervical: No cervical adenopathy.  Skin:    General: Skin is warm and dry.  Neurological:     General: No focal deficit present.     Mental Status: She is alert and oriented to person, place, and time.  Psychiatric:        Mood and Affect: Mood normal.        Behavior: Behavior normal.    PAST MEDICAL/SURGICAL HISTORY:  Past Medical History:  Diagnosis Date   Anemia     Arthritis    Asthma    COPD (chronic obstructive pulmonary disease) (HCC)    COVID-19    Deep vein thrombosis (DVT) of both lower extremities ([place]) 06/27/2015   Fibromyalgia    GERD (gastroesophageal reflux disease)    H/O hiatal hernia    Hypercholesteremia    Hypertension    Hyperthyroidism    IBS (irritable bowel syndrome)    Inappropriate sinus tachycardia    Inner ear disease    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   Type 2 diabetes mellitus ([place])    Past Surgical History:  Procedure Laterality Date   ABDOMINAL HYSTERECTOMY  partial   CARPAL TUNNEL RELEASE Right 1991   CATARACT EXTRACTION W/PHACO Right 05/08/2013   Procedure: CATARACT EXTRACTION PHACO AND INTRAOCULAR LENS PLACEMENT ([place]);  Surgeon: Tonny Branch, MD;  Location: [facility];  Service: Ophthalmology;  Laterality: Right;  CDE 10.31   CATARACT EXTRACTION W/PHACO Left 08/17/2013   Procedure: CATARACT EXTRACTION PHACO AND INTRAOCULAR LENS PLACEMENT ([facility]);  Surgeon: Tonny Branch, MD;  Location: [facility];  Service: Ophthalmology;  Laterality: Left;  CDE:9.03   CHOLECYSTECTOMY  1971   COLONOSCOPY WITH PROPOFOL N/A 01/06/2016   Dr. Gala Romney: diverticulosis    DENTAL SURGERY     ESOPHAGEAL BRUSHING  08/29/2019   Procedure: ESOPHAGEAL BRUSHING;  Surgeon: Daneil Dolin, MD;  Location: [facility];  Service: Endoscopy;;   ESOPHAGOGASTRODUODENOSCOPY (EGD) WITH PROPOFOL N/A 01/06/2016   Dr. Gala Romney: normal s/p empiric dilation    ESOPHAGOGASTRODUODENOSCOPY (EGD) WITH PROPOFOL N/A 08/29/2019   esophageal plaques vs medication residue adherent to tubular esophagus s/p KOH brushing and dilation. Medium-sized hiatal hernia. + for candida. Diflucan.    EYE SURGERY     IR BONE MARROW BIOPSY & ASPIRATION  03/31/2022   MALONEY DILATION N/A 01/06/2016   Procedure: Venia Minks DILATION;  Surgeon: Daneil Dolin, MD;  [facility]: [facility];  Service: Endoscopy;  Laterality: N/A;   MALONEY DILATION N/A 08/29/2019    Procedure: Venia Minks DILATION;  Surgeon: Daneil Dolin, MD;  Location: [facility];  Service: Endoscopy;  Laterality: N/A;   REVERSE SHOULDER ARTHROPLASTY Right 08/06/2017   Procedure: RIGHT REVERSE SHOULDER ARTHROPLASTY;  Surgeon: Netta Cedars, MD;  Location: [facility];  Service: Orthopedics;  Laterality: Right;   WRIST GANGLION EXCISION Left     SOCIAL HISTORY:  Social History   Socioeconomic History   Marital status: Married    Spouse name: Not on file   Number of children: Not on file   Years of education: Not on file   Highest education level: Not on file  Occupational History   Not on file  Tobacco Use   Smoking status: Former    Packs/day: 0.25    Years: 30.00    Total pack years: 7.50    Types: Cigarettes    Quit date: 02/17/2011    Years since quitting: 11.1   Smokeless tobacco: Never  Vaping Use   Vaping Use: Never used  Substance and Sexual Activity   Alcohol use: Not Currently    Comment: rare   Drug use: No   Sexual activity: Not Currently    Birth control/protection: Surgical  Other Topics Concern   Not on file  Social History Narrative   Not on file   Social Determinants of Health   Financial Resource Strain: Not on file  Food Insecurity: Not on file  Transportation Needs: Not on file  Physical Activity: Not on file  Stress: Not on file  Social Connections: Not on file  Intimate Partner Violence: Not on file    FAMILY HISTORY:  Family History  Problem Relation Age of Onset   Hypertension Mother    Diabetes Mother    COPD Mother    Arthritis Mother    Diabetes Father    Arthritis Father    Dementia Father    CAD Father    Hypothyroidism Sister    Diabetes Brother    Colon cancer Niece    Colon polyps Neg Hx     CURRENT MEDICATIONS:  Outpatient Encounter Medications as of 04/14/2022  Medication Sig   Accu-Chek FastClix Lancets MISC Apply topically.   ACCU-CHEK GUIDE test strip 4 (four) times daily.   acetaminophen (TYLENOL) 500 MG tablet  Take 1,000 mg by mouth every 6 (six) hours as needed for moderate pain or headache.   albuterol (PROVENTIL) (2.5 MG/3ML) 0.083% nebulizer solution Take 2.5 mg by nebulization every 6 (six) hours as needed for wheezing or shortness of breath.   albuterol (VENTOLIN HFA) 108 (90 Base) MCG/ACT inhaler Inhale 1-2 puffs into the lungs every 6 (six) hours as needed for wheezing or shortness of breath.   Alcohol Swabs (ALCOHOL PADS) 70 % PADS SMARTSIG:Pledget(s) Topical 4 Times Daily   apixaban (ELIQUIS) 5 MG TABS tablet Take 1 tablet (5 mg total) by mouth 2 (two) times daily.   ARNUITY ELLIPTA 100 MCG/ACT AEPB Inhale 1 puff into the lungs at bedtime.    Ascorbic Acid (VITAMIN C) 1000 MG tablet Take 500 mg by mouth daily.   atorvastatin (LIPITOR) 80 MG tablet Take 1 tablet (80 mg total) by mouth daily.   bisoprolol (ZEBETA) 10 MG tablet TAKE 1 TABLET(10 MG) BY MOUTH IN THE MORNING AND AT BEDTIME   Blood Glucose Calibration (ACCU-CHEK GUIDE CONTROL) LIQD See admin instructions.   Blood Glucose Monitoring Suppl (ACCU-CHEK GUIDE ME) w/Device KIT 4 (four) times daily.   Cetirizine HCl 10 MG CAPS Take 10 mg by mouth daily.   Cholecalciferol (VITAMIN D) 2000 units tablet Take 4,000 Units by mouth daily.    cimetidine (TAGAMET) 200 MG tablet Take 0.5 tablets (100 mg total) by mouth daily as needed. (Patient taking differently: Take 100 mg by mouth daily as needed (indigestion).)   CINNAMON PO Take 1-2 capsules by mouth 3 (three) times daily.   clobetasol ointment (TEMOVATE) [id number] % Apply 1 application topically 3 (three) times daily as needed (imflammation).   diphenhydrAMINE (BENADRYL) 25 mg capsule Take 50 mg by mouth every 4 (four) hours as needed for itching.   DULoxetine (CYMBALTA) 60 MG capsule Take 60 mg by mouth 2 (two) times daily.   EASY  COMFORT PEN NEEDLES 31G X 5 MM MISC INJECT IN SULIN EVERY DAY AS DIRECTED   empagliflozin (JARDIANCE) 10 MG TABS tablet Take 1 tablet (10 mg total) by mouth daily.    fluticasone (VERAMYST) 27.5 MCG/SPRAY nasal spray Place 2 sprays into the nose daily.   guaiFENesin (ROBITUSSIN) 100 MG/5ML SOLN Take 5 mLs (100 mg total) by mouth every 4 (four) hours as needed for cough or to loosen phlegm.   HUMALOG KWIKPEN 100 UNIT/ML KwikPen Inject 5-11 Units into the skin 3 (three) times daily. Sliding Scale (Patient taking differently: Inject 3 Units into the skin 3 (three) times daily. Sliding Scale)   hydrOXYzine (ATARAX/VISTARIL) 25 MG tablet Take 25 mg by mouth every 8 (eight) hours as needed for itching.    insulin degludec (TRESIBA FLEXTOUCH) 100 UNIT/ML FlexTouch Pen Inject 20 Units into the skin at bedtime.   ipratropium-albuterol (DUONEB) 0.5-2.5 (3) MG/3ML SOLN Take 3 mLs by nebulization every 6 (six) hours as needed (shortness of breath).   Lancet Devices (EASY MINI EJECT LANCING DEVICE) MISC 4 (four) times daily.   levothyroxine (SYNTHROID) 88 MCG tablet Take 88 mcg by mouth daily before breakfast.   lidocaine (LIDODERM) 5 % Place 1 patch onto the skin daily. Remove & Discard patch within 12 hours or as directed by MD   lubiprostone (AMITIZA) 24 MCG capsule Take 1 capsule (24 mcg total) by mouth 2 (two) times daily with a meal.   LYRICA 75 MG capsule Take 75 mg by mouth 2 (two) times daily.    methocarbamol (ROBAXIN) 500 MG tablet Take 1 tablet (500 mg total) by mouth 3 (three) times daily as needed. (Patient taking differently: Take 500 mg by mouth 3 (three) times daily as needed for muscle spasms.)   montelukast (SINGULAIR) 10 MG tablet Take 10 mg by mouth at bedtime.   omega-3 acid ethyl esters (LOVAZA) 1 g capsule Take 2 capsules (2 g total) by mouth 2 (two) times daily.   pantoprazole (PROTONIX) 40 MG tablet Take 1 tablet (40 mg total) by mouth daily before breakfast.   potassium chloride (KLOR-CON) 10 MEQ tablet Take 10 mEq by mouth daily.   REPATHA SURECLICK [id number] MG/ML SOAJ ADMINISTER 1 ML UNDER THE SKIN EVERY 14 DAYS   RESTASIS 0.05 % ophthalmic emulsion  Place 1 drop into both eyes 2 (two) times daily.   telmisartan (MICARDIS) 20 MG tablet Take 40 mg by mouth daily.   tirzepatide (MOUNJARO) 7.5 MG/0.5ML Pen Inject 7.5 mg into the skin once a week.   torsemide (DEMADEX) 20 MG tablet Take 2 tablets (40 mg total) by mouth daily.   traMADol (ULTRAM) 50 MG tablet Take 50 mg by mouth 3 (three) times daily as needed for moderate pain.   traZODone (DESYREL) 50 MG tablet Take 150 mg by mouth at bedtime as needed for sleep.   vitamin B-12 (CYANOCOBALAMIN) 1000 MCG tablet Take 1,000 mcg by mouth daily.   No facility-administered encounter medications on file as of 04/14/2022.    ALLERGIES:  Allergies  Allergen Reactions   Tetracyclines & Related Anaphylaxis and Rash   Banana Hives and Nausea And Vomiting   Penicillins Rash and Other (See Comments)    Has patient had a PCN reaction causing immediate rash, facial/tongue/throat swelling, SOB or lightheadedness with hypotension: No Has patient had a PCN reaction causing severe rash involving mucus membranes or skin necrosis: No Has patient had a PCN reaction that required hospitalization No Has patient had a PCN reaction occurring within the last  10 years: No If all of the above answers are "NO", then may proceed with Cephalosporin use.     LABORATORY DATA:  I have reviewed the labs as listed.  CBC    Component Value Date/Time   WBC 13.2 (H) 03/31/2022 0745   RBC 5.21 (H) 03/31/2022 0745   HGB 13.6 03/31/2022 0745   HCT 43.6 03/31/2022 0745   PLT 273 03/31/2022 0745   MCV 83.7 03/31/2022 0745   MCH 26.1 03/31/2022 0745   MCHC 31.2 03/31/2022 0745   RDW 14.2 03/31/2022 0745   LYMPHSABS 3.5 03/31/2022 0745   MONOABS 1.0 03/31/2022 0745   EOSABS 0.4 03/31/2022 0745   BASOSABS 0.1 03/31/2022 0745      Latest Ref Rng & Units 03/10/2022    2:14 PM 01/29/2022   12:30 PM 10/16/2021    1:40 PM  CMP  Glucose 70 - 99 mg/dL 335  351  157   BUN 8 - 23 mg/dL '16  17  29   '$ Creatinine 0.44 - 1.00  mg/dL 1.24  1.31  1.55   Sodium 135 - 145 mmol/L 130  134  136   Potassium 3.5 - 5.1 mmol/L 4.3  3.7  4.1   Chloride 98 - 111 mmol/L 99  104  107   CO2 22 - 32 mmol/L '23  22  24   '$ Calcium 8.9 - 10.3 mg/dL 8.5  8.9  8.6   Total Protein 6.5 - 8.1 g/dL 8.8  8.7    Total Bilirubin 0.3 - 1.2 mg/dL 0.5  0.5    Alkaline Phos 38 - 126 U/L 356  351    AST 15 - 41 U/L 54  46    ALT 0 - 44 U/L 74  59      DIAGNOSTIC IMAGING:  I have independently reviewed the relevant imaging and discussed with the patient.   WRAP UP:  All questions were answered. The patient knows to call the clinic with any problems, questions or concerns.  Medical decision making: Moderate  Time spent on visit: I spent 20 minutes counseling the patient face to face. The total time spent in the appointment was 30 minutes and more than 50% was on counseling.  Harriett Rush, PA-C  04/14/2022 9:19 AM

## 2022-04-14 ENCOUNTER — Other Ambulatory Visit: Payer: Self-pay

## 2022-04-14 ENCOUNTER — Inpatient Hospital Stay: Payer: 59 | Attending: Hematology | Admitting: Physician Assistant

## 2022-04-14 VITALS — BP 151/85 | HR 78 | Temp 98.4°F | Resp 18 | Ht 68.0 in | Wt 229.9 lb

## 2022-04-14 DIAGNOSIS — D72825 Bandemia: Secondary | ICD-10-CM

## 2022-04-14 DIAGNOSIS — D631 Anemia in chronic kidney disease: Secondary | ICD-10-CM

## 2022-04-14 DIAGNOSIS — R937 Abnormal findings on diagnostic imaging of other parts of musculoskeletal system: Secondary | ICD-10-CM

## 2022-04-14 DIAGNOSIS — D472 Monoclonal gammopathy: Secondary | ICD-10-CM

## 2022-04-14 DIAGNOSIS — D509 Iron deficiency anemia, unspecified: Secondary | ICD-10-CM

## 2022-04-14 NOTE — Patient Instructions (Addendum)
[facility] **   You were seen today by Tarri Abernethy PA-C to discuss the results of your recent bone marrow biopsy.    SMOLDERING MYELOMA Your bone marrow biopsy shows that you have progressed beyond simple "MGUS" but did not show full myeloma either.  You are in the "gray area" in between, something that we call "smoldering myeloma."   You do not need any treatment for smoldering myeloma, but since it has a higher risk of progressing to myeloma cancer, we will monitor you more closely  With labs and an office visit every 3 months.  LABS: Return in 3 months for repeat labs  OTHER TESTS: MRI spine  FOLLOW-UP APPOINTMENT: Office visit in 3 months (1 week after labs)  ** Thank you for trusting me with your healthcare!  I strive to provide all of my patients with quality care at each visit.  If you receive a survey for this visit, I would be so grateful to you for taking the time to provide feedback.  Thank you in advance!  ~ Dareen Gutzwiller                   Dr. Derek Jack   &   Tarri Abernethy, PA-C   - - - - - - - - - - - - - - - - - -    Thank you for choosing [place] at [facility] to provide your oncology and hematology care.  To afford each patient quality time with our provider, please arrive at least 15 minutes before your scheduled appointment time.   If you have a lab appointment with the [facility] please come in thru the Main Entrance and check in at the main information desk.  You need to re-schedule your appointment should you arrive 10 or more minutes late.  We strive to give you quality time with our providers, and arriving late affects you and other patients whose appointments are after yours.  Also, if you no show three or more times for appointments you may be dismissed from the clinic at the providers discretion.     Again, thank you for choosing [facility].  Our hope is that these requests will decrease the amount of time that you wait before being seen by our physicians.       _____________________________________________________________  Should you have questions after your visit to [facility], please contact our office at [phone] and follow the prompts.  Our office hours are 8:00 a.m. and 4:30 p.m. Monday - Friday.  Please note that voicemails left after 4:00 p.m. may not be returned until the following business day.  We are closed weekends and major holidays.  You do have access to a nurse 24-7, just call the main number to the clinic [phone] and do not press any options, hold on the line and a nurse will answer the phone.    For prescription refill requests, have your pharmacy contact our office and allow 72 hours.

## 2022-04-14 NOTE — Progress Notes (Signed)
Request sent to Pathology for MYD88 testing per Dr. Delton Coombes and Tarri Abernethy, PA-C.

## 2022-04-16 ENCOUNTER — Encounter: Payer: Self-pay | Admitting: Radiology

## 2022-04-20 ENCOUNTER — Encounter (HOSPITAL_COMMUNITY): Payer: Self-pay

## 2022-04-21 ENCOUNTER — Ambulatory Visit (INDEPENDENT_AMBULATORY_CARE_PROVIDER_SITE_OTHER): Payer: 59 | Admitting: Neurology

## 2022-04-21 ENCOUNTER — Encounter: Payer: Self-pay | Admitting: Neurology

## 2022-04-21 VITALS — BP 148/86 | HR 105 | Ht 69.0 in

## 2022-04-21 DIAGNOSIS — E782 Mixed hyperlipidemia: Secondary | ICD-10-CM

## 2022-04-21 DIAGNOSIS — Z8673 Personal history of transient ischemic attack (TIA), and cerebral infarction without residual deficits: Secondary | ICD-10-CM

## 2022-04-21 DIAGNOSIS — G4733 Obstructive sleep apnea (adult) (pediatric): Secondary | ICD-10-CM | POA: Diagnosis not present

## 2022-04-21 MED ORDER — REPATHA SURECLICK 140 MG/ML ~~LOC~~ SOAJ: 140.0000 mg | SUBCUTANEOUS | 6 refills | Status: DC

## 2022-04-21 NOTE — Patient Instructions (Signed)
I had a long d/w patient about her recent cryptogenic stroke, sleep apnea, risk for recurrent stroke/TIAs, personally independently reviewed imaging studies and stroke evaluation results and answered questions.Continue Eliquis (apixaban) daily  for secondary stroke prevention given her history of DVT and maintain strict control of hypertension with blood pressure goal below 130/90, diabetes with hemoglobin A1c goal below 6.5% and lipids with LDL cholesterol goal below 70 mg/dL. I also advised the patient to eat a healthy diet with plenty of whole grains, cereals, fruits and vegetables, exercise regularly and maintain ideal body weight.  Check follow-up lcarotid ultrasound and start Repatha injections as LDL is suboptimally controlled on maximal dose of lipitor.  Referred to sleep clinic for further evaluation and treatment for his sleep apnea.     Followup in the future with my nurse practitioner  in 6 months or call earlier if necessary.

## 2022-04-21 NOTE — Progress Notes (Signed)
[facility] [address] [name] [address] [phone]       OFFICE FOLLOW-UP NOTE  [name] Date of Birth:  11/09/1950 Medical Record Number:  [id number]   [id number] visit 09/09/2021 [name] is a 72 year old pleasant African-American lady seen today for initial office follow-up visit following consultation for stroke in April 2023.  History is obtained from the patient and review of electronic medical records and I have personally reviewed pertinent available imaging films in PACS.  She has past medical history of diabetes, hypertension, hyperlipidemia, obesity, gastroesophageal reflux disease, fibromyalgia, deep vein thrombosis on Eliquis, COPD, asthma and arthritis.  She presented on 05/17/2021 with sudden onset of left hand weakness.  She was seen by telestroke neurologist.  She was not a candidate for thrombolysis due to mild and resolving symptoms and presenting late.  MRI scan showed an acute right frontal precentral gyrus and subcortical white matter infarct as well as an additional tiny right insular subacute infarct felt to be of embolic cryptogenic etiology.  MR angiogram of the brain showed no large vessel stenosis or occlusion and carotid ultrasound showed no significant extracranial stenosis.  Transcranial Doppler bubble study was negative for PFO.  2D echo showed ejection fraction of 45 to 50% with no cardiac source of embolism.  LDL cholesterol was elevated at 166 mg percent and hemoglobin A1c at 8.3.  Telemetry monitoring did not reveal A-fib and subsequently outpatient 30-day heart monitor showed no evidence of paroxysmal A-fib either.  Patient was continued on Eliquis due to her history of deep vein thrombosis which is tolerating well without bruising or bleeding.  She has noticed improvement in the left hand weakness that she has only occasional diminished fine motor skills and has trouble grasping objects occasionally and may drop it.  She states her  sugars are doing much better fastings range between 70s to occasionally 180s.  Blood pressure has been fluctuating and today it is elevated at 159/82.  She is tolerating Lipitor well without side effects but has not had any follow-up lipid profile checked.  She is ambulating independently but needs to use a cane due to her chronic back pain.  She has had no recent falls or injuries.  The patient participated in the sleep smart stroke prevention study and tested positive on the NOx 3 monitor for sleep apnea.  She was randomized to the medical treatment arm.  She has expressed interest to be referred to the sleep physician for consideration of using CPAP through her insurance. Update 04/21/2022 : She returns for follow-up after last visit 7 months ago.  She states she is doing well.  She has had no recurrent stroke or TIA symptoms.  Remains on Eliquis for DVT which is tolerating well without bleeding or bruising.  Her blood pressure is usually well-controlled though today it is 148/86 office.  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  Last hemoglobin A1c was 8.8 and LDL cholesterol 95 mg percent on 01/29/2022.  She is on full dose Lipitor but has not yet tried Praluent or Repatha injections.  She is able to ambulate with a cane but prefers to use a wheelchair for long distances.  He had a fall in December 2023 and has gotten scared so she has been walking quite less since then.  She has completed  participation in the sleep smart study but not yet being seen in sleep clinic to start treatment for her sleep apnea.      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   Type 2 diabetes mellitus (HCC)     Social History:  Social History   Socioeconomic History   Marital status: Married    Spouse name: Not on file   Number of children: Not on file   Years of education: Not on file   Highest education level: Not on file  Occupational History   Not on file  Tobacco Use   Smoking status: Former    Packs/day: 0.25    Years: 30.00    Total pack years: 7.50    Types: Cigarettes    Quit date: 02/17/2011    Years since quitting: 11.1   Smokeless tobacco: Never  Vaping Use   Vaping Use: Never used  Substance and Sexual Activity   Alcohol use: Not Currently    Comment: rare   Drug use: No   Sexual activity: Not Currently    Birth control/protection: Surgical  Other Topics Concern   Not on file  Social History Narrative   Not on file   Social Determinants of Health   Financial Resource Strain: Not on file  Food Insecurity: Not on file  Transportation Needs: Not on file  Physical Activity: Not on file  Stress: Not on file  Social Connections: Not on file  Intimate Partner Violence: Not on file    Medications:   Current Outpatient Medications on File Prior to Visit  Medication Sig Dispense Refill   Accu-Chek FastClix Lancets MISC Apply topically.     ACCU-CHEK GUIDE test strip 4 (four) times daily.     acetaminophen (TYLENOL) 500 MG tablet Take 1,000 mg by mouth every 6 (six) hours as needed for moderate pain or headache.     albuterol (PROVENTIL) (2.5 MG/3ML) 0.083% nebulizer solution Take 2.5 mg by nebulization every 6 (six) hours as  needed for wheezing or shortness of breath.     albuterol (VENTOLIN HFA) 108 (90 Base) MCG/ACT inhaler Inhale 1-2 puffs into the lungs every 6 (six) hours as needed for wheezing or shortness of breath. 18 g 0   Alcohol Swabs (ALCOHOL PADS) 70 % PADS SMARTSIG:Pledget(s) Topical 4 Times Daily     apixaban (ELIQUIS) 5 MG TABS tablet Take 1 tablet (5 mg total) by mouth 2 (two) times daily.     ARNUITY ELLIPTA 100 MCG/ACT AEPB Inhale 1 puff into the lungs at bedtime.   11   Ascorbic Acid (VITAMIN C) 1000 MG tablet Take 500 mg by mouth daily.     atorvastatin (LIPITOR) 80 MG tablet Take 1 tablet (80 mg total) by mouth daily. 30 tablet 0   bisoprolol (ZEBETA) 10 MG  tablet TAKE 1 TABLET(10 MG) BY MOUTH IN THE MORNING AND AT BEDTIME 180 tablet 3   Blood Glucose Calibration (ACCU-CHEK GUIDE CONTROL) LIQD See admin instructions.     Blood Glucose Monitoring Suppl (ACCU-CHEK GUIDE ME) w/Device KIT 4 (four) times daily.     Cetirizine HCl 10 MG CAPS Take 10 mg by mouth daily.     Cholecalciferol (VITAMIN D) 2000 units tablet Take 4,000 Units by mouth daily.      cimetidine (TAGAMET) 200 MG tablet Take 0.5 tablets (100 mg total) by mouth daily as needed. (Patient taking differently: Take 100 mg by mouth daily as needed (indigestion).)     CINNAMON PO Take 1-2 capsules by mouth 3 (three) times daily.     clobetasol ointment (TEMOVATE) [id number] % Apply 1 application topically 3 (three) times daily as needed (imflammation).     diphenhydrAMINE (BENADRYL) 25 mg capsule Take 50 mg by mouth every 4 (four) hours as needed for itching.     DULoxetine (CYMBALTA) 60 MG capsule Take 60 mg by mouth 2 (two) times daily.     EASY COMFORT PEN NEEDLES 31G X 5 MM MISC INJECT IN SULIN EVERY DAY AS DIRECTED     empagliflozin (JARDIANCE) 10 MG TABS tablet Take 1 tablet (10 mg total) by mouth daily. 90 tablet 3   fluticasone (VERAMYST) 27.5 MCG/SPRAY nasal spray Place 2 sprays into the nose daily.     guaiFENesin (ROBITUSSIN) 100  MG/5ML SOLN Take 5 mLs (100 mg total) by mouth every 4 (four) hours as needed for cough or to loosen phlegm. 236 mL 0   HUMALOG KWIKPEN 100 UNIT/ML KwikPen Inject 5-11 Units into the skin 3 (three) times daily. Sliding Scale (Patient taking differently: Inject 3 Units into the skin 3 (three) times daily. Sliding Scale) 30 mL 3   hydrOXYzine (ATARAX/VISTARIL) 25 MG tablet Take 25 mg by mouth every 8 (eight) hours as needed for itching.      insulin degludec (TRESIBA FLEXTOUCH) 100 UNIT/ML FlexTouch Pen Inject 20 Units into the skin at bedtime. 18 mL 3   ipratropium-albuterol (DUONEB) 0.5-2.5 (3) MG/3ML SOLN Take 3 mLs by nebulization every 6 (six) hours as needed (shortness of breath).     Lancet Devices (EASY MINI EJECT LANCING DEVICE) MISC 4 (four) times daily.     levothyroxine (SYNTHROID) 88 MCG tablet Take 88 mcg by mouth daily before breakfast.     lidocaine (LIDODERM) 5 % Place 1 patch onto the skin daily. Remove & Discard patch within 12 hours or as directed by MD 30 patch 0   lubiprostone (AMITIZA) 24 MCG capsule Take 1 capsule (24 mcg total) by mouth 2 (two) times daily with a meal. 60 capsule 11   LYRICA 75 MG capsule Take 75 mg by mouth 2 (two) times daily.      methocarbamol (ROBAXIN) 500 MG tablet Take 1 tablet (500 mg total) by mouth 3 (three) times daily as needed. (Patient taking differently: Take 500 mg by mouth 3 (three) times daily as needed for muscle spasms.) 60 tablet 1   montelukast (SINGULAIR) 10 MG tablet Take 10 mg by mouth at bedtime.     Omega-3 Fatty Acids (OMEGA 3 PO) Take 1 capsule by mouth daily.     pantoprazole (PROTONIX) 40 MG tablet Take 1 tablet (40 mg total) by mouth daily before breakfast. 90 tablet 3   potassium chloride (KLOR-CON) 10 MEQ tablet Take 10 mEq by mouth daily.     REPATHA SURECLICK [id number] MG/ML SOAJ  ADMINISTER 1 ML UNDER THE SKIN EVERY 14 DAYS 2 mL 3   RESTASIS 0.05 % ophthalmic emulsion Place 1 drop into both eyes 2 (two) times daily.      telmisartan (MICARDIS) 20 MG tablet Take 40 mg by mouth daily.     tirzepatide (MOUNJARO) 7.5 MG/0.5ML Pen Inject 7.5 mg into the skin once a week. 6 mL 3   torsemide (DEMADEX) 20 MG tablet Take 2 tablets (40 mg total) by mouth daily. 60 tablet 0   traMADol (ULTRAM) 50 MG tablet Take 50 mg by mouth 3 (three) times daily as needed for moderate pain.  1   traZODone (DESYREL) 150 MG tablet Take 150 mg by mouth at bedtime.     vitamin B-12 (CYANOCOBALAMIN) 1000 MCG tablet Take 1,000 mcg by mouth daily.     No current facility-administered medications on file prior to visit.    Allergies:   Allergies  Allergen Reactions   Tetracyclines & Related Anaphylaxis and Rash   Banana Hives and Nausea And Vomiting   Penicillins Rash and Other (See Comments)    Has patient had a PCN reaction causing immediate rash, facial/tongue/throat swelling, SOB or lightheadedness with hypotension: No Has patient had a PCN reaction causing severe rash involving mucus membranes or skin necrosis: No Has patient had a PCN reaction that required hospitalization No Has patient had a PCN reaction occurring within the last 10 years: No If all of the above answers are "NO", then may proceed with Cephalosporin use.     Physical Exam General: Obese middle-aged African-American lady, seated, in no evident distress Head: head normocephalic and atraumatic.  Neck: supple with no carotid or supraclavicular bruits Cardiovascular: regular rate and rhythm, no murmurs Musculoskeletal: no deformity Skin: Bilateral nodular rash on forearms and right leg  vascular:  Normal pulses all extremities Vitals:   04/21/22 1517  BP: (!) 148/86  Pulse: (!) 105   Neurologic Exam Mental Status: Awake and fully alert. Oriented to place and time. Recent and remote memory intact. Attention span, concentration and fund of knowledge appropriate. Mood and affect appropriate.  Cranial Nerves: Fundoscopic exam not done. Pupils equal, briskly  reactive to light. Extraocular movements full without nystagmus. Visual fields full to confrontation. Hearing intact. Facial sensation intact. Face, tongue, palate moves normally and symmetrically.  Motor: Normal bulk and tone. Normal strength in all tested extremity muscles.  Diminished fine finger movements on the left.  Orbits right over left upper extremity. Sensory.: intact to touch ,pinprick .position and vibratory sensation.  Coordination: Rapid alternating movements normal in all extremities. Finger-to-nose and heel-to-shin performed accurately bilaterally. Gait and Station:  deferred as she is in wheel chair Reflexes: 1+ and symmetric. Toes downgoing.   NIHSS  0 Modified Rankin  1   ASSESSMENT: 72 year old African-American lady with cryptogenic right frontal MCA branch infarct and April 2023.  Done well with significant improvement.  Vascular risk factors of uncontrolled diabetes, hyperlipidemia, obesity, hypertension and sleep apnea.  S     PLAN: I had a long d/w patient about her recent cryptogenic stroke, sleep apnea, risk for recurrent stroke/TIAs, personally independently reviewed imaging studies and stroke evaluation results and answered questions.Continue Eliquis (apixaban) daily  for secondary stroke prevention given her history of DVT and maintain strict control of hypertension with blood pressure goal below 130/90, diabetes with hemoglobin A1c goal below 6.5% and lipids with LDL cholesterol goal below 70 mg/dL. I also advised the patient to eat a healthy diet with plenty of whole grains,  cereals, fruits and vegetables, exercise regularly and maintain ideal body weight.  Check follow-up carotid ultrasound and start Repatha injections as LDL is suboptimally controlled on maximal dose of lipitor.  Referred to sleep clinic for further evaluation and treatment for his sleep apnea.     Followup in the future with my nurse practitioner  in 6 months or call earlier if necessary. Greater  than 50% of time during this 35 minute visit was spent on counseling,explanation of diagnosis of cryptogenic stroke and sleep apnea, planning of further management, discussion with patient and family and coordination of care Antony Contras, MD Note: This document was prepared with digital dictation and possible smart phrase technology. Any transcriptional errors that result from this process are unintentional

## 2022-05-07 ENCOUNTER — Ambulatory Visit (HOSPITAL_COMMUNITY): Payer: 59

## 2022-05-08 ENCOUNTER — Ambulatory Visit (HOSPITAL_COMMUNITY)
Admission: RE | Admit: 2022-05-08 | Discharge: 2022-05-08 | Disposition: A | Discharge status: Home / Self Care | Payer: 59 | Source: Ambulatory Visit | Attending: Physician Assistant | Admitting: Physician Assistant

## 2022-05-08 DIAGNOSIS — R937 Abnormal findings on diagnostic imaging of other parts of musculoskeletal system: Secondary | ICD-10-CM | POA: Insufficient documentation

## 2022-05-08 MED ORDER — GADOBUTROL 1 MMOL/ML IV SOLN
10.0000 mL | Freq: Once | INTRAVENOUS | Status: AC | PRN
  Administered 2022-05-08: 10 mL via INTRAVENOUS

## 2022-05-14 ENCOUNTER — Encounter (HOSPITAL_COMMUNITY): Payer: Self-pay | Admitting: Hematology

## 2022-05-14 ENCOUNTER — Telehealth: Payer: Self-pay

## 2022-05-14 NOTE — Telephone Encounter (Signed)
-----   Message from Harriett Rush, [place] sent at 05/12/2022  3:48 PM EDT ----- Horris Latino: Please call patient to let her know that the MRI of her spine did not show any evidence of multiple myeloma cancer in her vertebrae.  She does have evidence of significant degenerative disc disease, for which she should follow-up with her primary care provider and/or spine specialist.

## 2022-05-14 NOTE — Telephone Encounter (Signed)
Called the patient with message per provider.  Patient verbalized understanding.

## 2022-05-20 ENCOUNTER — Other Ambulatory Visit: Payer: Self-pay | Admitting: Gastroenterology

## 2022-05-20 ENCOUNTER — Ambulatory Visit (INDEPENDENT_AMBULATORY_CARE_PROVIDER_SITE_OTHER): Payer: 59

## 2022-05-20 ENCOUNTER — Telehealth: Payer: Self-pay | Admitting: *Deleted

## 2022-05-20 DIAGNOSIS — Z8673 Personal history of transient ischemic attack (TIA), and cerebral infarction without residual deficits: Secondary | ICD-10-CM | POA: Diagnosis not present

## 2022-05-20 NOTE — Telephone Encounter (Signed)
Patient left a message . She states that the Mounjaro 7.5 mg , is not available at her pharmacy. They di not know when they will get this in. She is asking if we have a sample may she have one?

## 2022-05-20 NOTE — Telephone Encounter (Signed)
We dont have any samples.  She can try other pharmacies, including mail order pharmacies to see if they have it in stock and we can send the prescription there.

## 2022-05-21 LAB — HEPATIC FUNCTION PANEL
AG Ratio: 0.7 (calc) — ABNORMAL LOW (ref 1.0–2.5)
ALT: 62 U/L — ABNORMAL HIGH (ref 6–29)
AST: 52 U/L — ABNORMAL HIGH (ref 10–35)
Albumin: 3.4 g/dL — ABNORMAL LOW (ref 3.6–5.1)
Alkaline phosphatase (APISO): 372 U/L — ABNORMAL HIGH (ref 37–153)
Bilirubin, Direct: 0 mg/dL (ref 0.0–0.2)
Globulin: 4.6 g/dL (calc) — ABNORMAL HIGH (ref 1.9–3.7)
Indirect Bilirubin: 0.5 mg/dL (calc) (ref 0.2–1.2)
Total Bilirubin: 0.5 mg/dL (ref 0.2–1.2)
Total Protein: 8 g/dL (ref 6.1–8.1)

## 2022-05-21 LAB — GAMMA GT: GGT: 448 U/L — ABNORMAL HIGH (ref 3–65)

## 2022-05-22 NOTE — Telephone Encounter (Signed)
Patient was called and made aware. 

## 2022-05-25 ENCOUNTER — Other Ambulatory Visit: Payer: Self-pay | Admitting: *Deleted

## 2022-05-25 DIAGNOSIS — E1122 Type 2 diabetes mellitus with diabetic chronic kidney disease: Secondary | ICD-10-CM

## 2022-05-25 MED ORDER — TIRZEPATIDE 7.5 MG/0.5ML ~~LOC~~ SOAJ
7.5000 mg | SUBCUTANEOUS | 3 refills | Status: DC
Start: 2022-05-25 — End: 2022-09-29

## 2022-05-25 NOTE — Telephone Encounter (Signed)
[facility] does not have her Mounjaro 7.5 , she called and [facility] in [place] does.  Patient was called and made aware that the RX had been sent.

## 2022-05-26 NOTE — Progress Notes (Unsigned)
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 Repeat CXR in 6 weeks to assure complete resolution of infiltrates.     Discharge Diagnoses:  Principal Problem:   Acute respiratory disease due to COVID-19 virus   Hypertension   COPD (chronic obstructive pulmonary disease) (HCC)   MGUS (monoclonal gammopathy of unknown significance)   Pneumonia due to COVID-19 virus   COVID-19   Chronic diastolic CHF (congestive heart failure) (HCC)     History of present illness:  Stephanie [name] is a 72 y.o. female with medical history significant of Covid pneumonia just discharged today to home from the hospital after completing remdesivir on steroids.  Patient normally is on 2 L of oxygen and was discharged on 6 L with exertion but at rest was only requiring 4 L.  She was transferred by ambulance home by the time she got home she was extremely short of breath and hypoxic.  Patient was readmitted for acute hypoxemic respiratory failure in the setting of Covid pneumonia.  Pulmonology following.   Hospital Course:  Acute hypoxemic respiratory failure secondary to COVID-19 pneumonia -Continue steroids and wean off O2 supplementation as tolerated.    -currently using 2L at rest and 3.5-4L on exertion -continue slow steroids tapering. -Procalcitonin low, not needing antibiotics currently -Continue breathing  treatments as needed -Appreciate pulmonology consultation and rec's   Acute COPD exacerbation related to above -IV steroids and breathing treatments as noted above -no wheezing on exam.   Hypokalemia -Repleted -safe to resume diuretics and daily supplementation. -follow electrolytes trend   Type 2 diabetes with hyperglycemia-improved -Resume home adjusted hypoglycemic regimen -advise to follow low carb diet. -Recent A1c of 9.2% -expecting improvement in her CBG's while tapering off steroids.    CKD stage IIIa with AKI -Improved and back to baseline at discharge -Safe to resume the use of metolazone and Demadex -Continue to maintain adequate hydration -Repeat basic metabolic panel at follow-up visit to assess electrolytes and renal function and stability.   Hypertension -stable -follow heart healthy diet. -Resume home antihypertensive agents.   Hypothyroidism -Continue Synthroid   Class 1 Obesity -low calorie diet, portion control and increase physical activity recommended.  -Body mass index is 33.89 kg/m.   Chronic diastolic heart failure -Stable and compensated -Continue to follow daily weights, low-sodium diet and resume home diuretic regimen.       History of Present Illness  06/18/2020  Pulmonary/ 1st office eval/ Tommie Dejoseph / [facility]  Chief Complaint  Patient presents with   Pulmonary Consult    Referred by Dr Sherryll Burger.  Pt had covid 06 Mar 2020- admitted to [facility] and sent home with supplemental o2. She gets winded with exertion such as cooking and cleaning her home. She uses her albuterol inhaler 2 x daily on average.   Dyspnea:  Room to room at home / scooter for shopping due to back > breathing on 2lpm  Cough: worse since covid / prednisone rx per PCP x 4 more days Sleep: flat bed with 3 pillows worse cough  SABA use: twice daily as above   rec Stop metaprolol  Bisoprolol 5 mg twice daily - call med to fine tune it.  Change pantoprazole 40 mg Take 30- 60  min before your first and last meals of the day  GERD Only use your albuterol as a rescue medication  Make sure you check your oxygen saturation  at your highest level of activity  to be sure it stays over 90%   Please schedule a follow up office visit in 2 weeks, call sooner if needed with all medications /inhalers/ solutions in hand      PFT's  09/17/20  FEV1 2.25 (102 % ) ratio 0.85 p 0 % improvement from saba (off anoro) prior to study with DLCO  11.63 (52%) corrects to 3.20 (78%)  for alv volume and FV curve flattened top of ext curve  erv not done,  needs cxr to correlate lung vol low       01/30/2021  f/u ov/[place] office/Ammanda Dobbins re: sob > cough ? Etiology  maint on anoro hs again  Chief Complaint  Patient presents with   Follow-up    Patient states she felt she was improving with her SOB until she received flu shot in Oct. 2022.   Uses 2LO2 cont. At night and prn during day, sometimes titrates to 3LO2 cont. With activity.   Dyspnea:  100 ft due to back and breathing  give out about the same time  Cough: 3 x day and dry  Sleeping: on side/ bed is raised on bricks SABA use: twice a day saba hfa/ neb once a day  02: 02 2lpm hs/ daytime prn  Covid status: vax x 2  Rec Continue Pantoprazole (protonix) 40 mg   Take  30-60 min before first meal of the day and tagamet 400  mg after supper until return to office - this is the best way to tell whether stomach acid is contributing to your problem.  Try off the anoro to see if it changes your breathing or your need for albuterol Ok to try albuterol 15 min before an activity (on alternating days with inhaler one day, the nebulizer one day and nothing the next day )  that you know would usually make you short of breath  Please remember to go to the  x-ray department  : wnl   05/27/2022  f/u ov/[place] office/Cainan Trull re: resp failure s/p covid Jan 2022  maint on 2lpm prn and arnuity  Chief Complaint  Patient presents with   Follow-up    Pt  f/u states that her breathing has been "so-so", she has had increased SOB & coughing. Only using O2 while @ home   Dyspnea:  leans on shopping cart but does scooter most of the time due to back spasms- even before covid she was mostly limited by back  Has rollator not using / also y membership but doesn't know what kind of ex to try for her back problems and hasn't discussed with her back specialist yet  Cough: minimal Sleeping: most nights does fine on bed blocks 2 pillows  SABA use: only uses p exertion, never prechallenges or rechallenges  02: completely prn basis, even at hs = 2lpm     No obvious day to day or daytime variability or assoc excess/ purulent sputum or mucus plugs or hemoptysis or cp or chest tightness,  subjective wheeze or overt sinus or hb symptoms.   Sleeping as above without nocturnal  or early am exacerbation  of respiratory  c/o's or need for noct saba. Also denies any obvious fluctuation of symptoms with weather or environmental changes or other aggravating or alleviating factors except as outlined above   No unusual exposure hx or h/o childhood pna/ asthma or knowledge of premature birth.  Current Allergies, Complete Past Medical History, Past Surgical History, Family History, and Social History were reviewed in [employer] record.  ROS  The following are not active complaints unless bolded Hoarseness, sore throat, dysphagia, dental problems, itching, sneezing,  nasal congestion or discharge of excess mucus or purulent secretions, ear ache,   fever, chills, sweats, unintended wt loss or wt gain, classically pleuritic or exertional cp,  orthopnea pnd or arm/hand swelling  or leg swelling, presyncope, palpitations, abdominal pain, anorexia, nausea, vomiting, diarrhea  or change in bowel habits or change in bladder habits, change in stools or change in urine, dysuria, hematuria,  rash, arthralgias, visual complaints, headache, numbness, weakness or  ataxia or problems with walking or coordination,  change in mood or  memory.        Current Meds  Medication Sig   Accu-Chek FastClix Lancets MISC Apply topically.   ACCU-CHEK GUIDE test strip 4 (four) times daily.   acetaminophen (TYLENOL) 500 MG tablet Take 1,000 mg by mouth every 6 (six) hours as needed for moderate pain or headache.   albuterol (PROVENTIL) (2.5 MG/3ML) 0.083% nebulizer solution Take 2.5 mg by nebulization every 6 (six) hours as needed for wheezing or shortness of breath.   albuterol (VENTOLIN HFA) 108 (90 Base) MCG/ACT inhaler Inhale 1-2 puffs into the lungs every 6 (six) hours as needed for wheezing or shortness of breath.   Alcohol Swabs (ALCOHOL PADS) 70 % PADS SMARTSIG:Pledget(s) Topical 4 Times Daily   apixaban (ELIQUIS) 5 MG TABS tablet Take 1 tablet (5 mg total) by mouth 2 (two) times daily.   ARNUITY ELLIPTA 100 MCG/ACT AEPB Inhale 1 puff into the lungs at bedtime.    Ascorbic Acid (VITAMIN C) 1000 MG tablet Take 500 mg by mouth daily.   atorvastatin (LIPITOR) 80 MG tablet Take 1 tablet (80 mg total) by mouth daily.   bisoprolol (ZEBETA) 10 MG tablet TAKE 1 TABLET(10 MG) BY MOUTH IN THE MORNING AND AT BEDTIME   Blood Glucose Calibration (ACCU-CHEK GUIDE CONTROL) LIQD See admin instructions.   Blood Glucose Monitoring Suppl (ACCU-CHEK GUIDE ME) w/Device KIT 4 (four) times daily.   Cetirizine HCl 10 MG CAPS Take 10 mg by mouth daily.   Cholecalciferol (VITAMIN D) 2000 units tablet Take 4,000 Units by mouth daily.    cimetidine (TAGAMET) 200 MG tablet Take 0.5 tablets (100 mg total) by mouth daily as needed. (Patient taking differently: Take 100 mg by mouth daily as needed (indigestion).)   CINNAMON PO Take 1-2 capsules by mouth 3 (three) times daily.   clobetasol ointment (TEMOVATE) 0.05 % Apply 1 application topically 3 (three) times daily as needed (imflammation).   diphenhydrAMINE (BENADRYL) 25 mg capsule Take 50 mg by mouth every 4 (four) hours as needed for  itching.   DULoxetine (CYMBALTA) 60 MG capsule Take 60 mg by mouth 2 (two) times daily.   EASY COMFORT PEN NEEDLES 31G X 5 MM MISC INJECT IN SULIN EVERY DAY AS DIRECTED   empagliflozin (JARDIANCE) 10 MG TABS tablet Take 1 tablet (10 mg total) by mouth daily.   fluticasone (VERAMYST)  27.5 MCG/SPRAY nasal spray Place 2 sprays into the nose daily.   guaiFENesin (ROBITUSSIN) 100 MG/5ML SOLN Take 5 mLs (100 mg total) by mouth every 4 (four) hours as needed for cough or to loosen phlegm.   HUMALOG KWIKPEN 100 UNIT/ML KwikPen Inject 5-11 Units into the skin 3 (three) times daily. Sliding Scale (Patient taking differently: Inject 3 Units into the skin 3 (three) times daily. Sliding Scale)   hydrOXYzine (ATARAX/VISTARIL) 25 MG tablet Take 25 mg by mouth every 8 (eight) hours as needed for itching.    insulin degludec (TRESIBA FLEXTOUCH) 100 UNIT/ML FlexTouch Pen Inject 20 Units into the skin at bedtime.   ipratropium-albuterol (DUONEB) 0.5-2.5 (3) MG/3ML SOLN Take 3 mLs by nebulization every 6 (six) hours as needed (shortness of breath).   Lancet Devices (EASY MINI EJECT LANCING DEVICE) MISC 4 (four) times daily.   levothyroxine (SYNTHROID) 88 MCG tablet Take 88 mcg by mouth daily before breakfast.   lidocaine (LIDODERM) 5 % Place 1 patch onto the skin daily. Remove & Discard patch within 12 hours or as directed by MD   lubiprostone (AMITIZA) 24 MCG capsule Take 1 capsule (24 mcg total) by mouth 2 (two) times daily with a meal.   LYRICA 75 MG capsule Take 75 mg by mouth 2 (two) times daily.    methocarbamol (ROBAXIN) 500 MG tablet Take 1 tablet (500 mg total) by mouth 3 (three) times daily as needed. (Patient taking differently: Take 500 mg by mouth 3 (three) times daily as needed for muscle spasms.)   montelukast (SINGULAIR) 10 MG tablet Take 10 mg by mouth at bedtime.   Omega-3 Fatty Acids (OMEGA 3 PO) Take 1 capsule by mouth daily.   pantoprazole (PROTONIX) 40 MG tablet Take 1 tablet (40 mg total) by  mouth daily before breakfast.   potassium chloride (KLOR-CON) 10 MEQ tablet Take 10 mEq by mouth daily.   REPATHA SURECLICK 140 MG/ML SOAJ ADMINISTER 1 ML UNDER THE SKIN EVERY 14 DAYS   REPATHA SURECLICK 140 MG/ML SOAJ Inject 140 mg into the skin every 14 (fourteen) days.   RESTASIS 0.05 % ophthalmic emulsion Place 1 drop into both eyes 2 (two) times daily.   telmisartan (MICARDIS) 20 MG tablet Take 40 mg by mouth daily.   tirzepatide (MOUNJARO) 7.5 MG/0.5ML Pen Inject 7.5 mg into the skin once a week.   torsemide (DEMADEX) 20 MG tablet Take 2 tablets (40 mg total) by mouth daily.   traMADol (ULTRAM) 50 MG tablet Take 50 mg by mouth 3 (three) times daily as needed for moderate pain.   traZODone (DESYREL) 150 MG tablet Take 150 mg by mouth at bedtime.   vitamin B-12 (CYANOCOBALAMIN) 1000 MCG tablet Take 1,000 mcg by mouth daily.                 Past Medical History:  Diagnosis Date   Anemia    Arthritis    Asthma    COPD (chronic obstructive pulmonary disease) (HCC)    Deep vein thrombosis (DVT) of both lower extremities (HCC) 06/27/2015   Diabetes mellitus    Fibromyalgia    GERD (gastroesophageal reflux disease)    H/O hiatal hernia    Hypercholesteremia    Hypertension    Hyperthyroidism    IBS (irritable bowel syndrome)    Inner ear disease    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   PONV (postoperative nausea and vomiting)    Tachycardia        Objective:  Wts  05/27/2022       230   01/30/2021     228  07/02/2020       228   06/25/20 226 lb (102.5 kg)  06/18/20 223 lb (101.2 kg)  03/18/20 222 lb 14.2 oz (101.1 kg)      Vital signs reviewed  05/27/2022  - Note at rest 02 sats  96% on RA   General appearance:    amb with cane mod obese bf nad   HEENT : Oropharynx  clear        NECK :  without  apparent JVD/ palpable Nodes/TM    LUNGS: no acc muscle use,  Nl contour chest which is clear to A and P bilaterally without cough on insp  or exp maneuvers   CV:  RRR  no s3 or murmur or increase in P2, and no edema   ABD:  soft and nontender with nl inspiratory excursion in the supine position. No bruits or organomegaly appreciated   MS:  has trouble getting up from chair and on table due to back pain and leg weakness but able to walk slow pace with can around office once upright.  Ext warm without deformities Or obvious joint restrictions  calf tenderness, cyanosis or clubbing    SKIN: warm and dry without lesions    NEURO:  alert, approp, nl sensorium with  no motor or cerebellar deficits apparent.            Assessment

## 2022-05-27 ENCOUNTER — Encounter: Payer: Self-pay | Admitting: Internal Medicine

## 2022-05-27 ENCOUNTER — Ambulatory Visit (INDEPENDENT_AMBULATORY_CARE_PROVIDER_SITE_OTHER): Payer: 59 | Admitting: Internal Medicine

## 2022-05-27 VITALS — BP 109/62 | HR 83 | Ht 69.0 in | Wt 230.4 lb

## 2022-05-27 DIAGNOSIS — R0609 Other forms of dyspnea: Secondary | ICD-10-CM

## 2022-05-27 NOTE — Patient Instructions (Addendum)
Ok to try albuterol 15 min before an activity (on alternating days)  that you know would usually make you short of breath and see if it makes any difference and if makes none then don't take albuterol after activity unless you can't catch your breath as this means it's the resting that helps, not the albuterol.    Make sure you check your oxygen saturation at your highest level of activity(NOT after you stop)  to be sure it stays over 90% and keep track of it at least once a week, more often if breathing getting worse, and let me know if losing ground. (Collect the dots to connect the dots approach)    Pulmonary follow up is as needed

## 2022-05-27 NOTE — Assessment & Plan Note (Signed)
Echo 03/16/20  GI  diastolic dysfunction only  -  06/24/1636   Walked    approx   100 ft  @ slow pace  stopped due to  Sob with sats 99% on 2lpm    -  Repeat echo 06/28/20  Low nl EF, diastolic dysfunction, mild RV dysfunction  - PFT's  09/17/20  FEV1 2.25 (102 % ) ratio 0.85 p 0 % improvement from saba p anoro prior to study with DLCO  11.63 (52%) corrects to 3.20 (78%)  for alv volume and FV curve flattened top of ext curve   - 01/30/2021   Walked on RA  x  One   lap(s) =  approx 250  ft  @ slow pace, stopped due to leg pain with lowest 02 sats 94%  - 05/27/2022   Walked on RA  x  1  lap(s) =  approx 150  ft  @ slow/cane pace, stopped due to legs/balance gave out  with lowest 02 sats 98%    Although dlco was low post covid, likely better now but clearly not the limiting issue based on today's observed walking study with sats in upper 90s thruout the study   Rec: Submax ex and track 02 sats over time - recumbent bike best option but she should discuss this with her back specialist  Re SABA :  I spent extra time with pt today reviewing appropriate use of albuterol for prn use on exertion with the following points: 1) saba is for relief of sob that does not improve by walking a slower pace or resting but rather if the pt does not improve after trying this first. 2) If the pt is convinced, as many are, that saba helps recover from activity faster then it's easy to tell if this is the case by re-challenging : ie stop, take the inhaler, then p 5 minutes try the exact same activity (intensity of workload) that just caused the symptoms and see if they are substantially diminished or not after saba 3) if there is an activity that reproducibly causes the symptoms, try the saba 15 min before the activity on alternate days   If in fact the saba really does help, then fine to continue to use it prn but advised may need to look closer at the maintenance regimen (which is now nothing) being used to achieve better  control of airways disease with exertion.   Pulmonary f/u is prn - 02 can be used prn unless a requalification is needed in which case I would rec a formal ONO on room air rather than ex study as she is not likely to push to high enough 02 uptake to desaturate with ex even if she has a low dlco (which has not been repeated but is a moot issue for now)   Each maintenance medication was reviewed in detail including emphasizing most importantly the difference between maintenance and prns and under what circumstances the prns are to be triggered using an action plan format where appropriate.  Total time for H and P, chart review, counseling, reviewing hfa/neb/02 device(s) , directly observing portions of ambulatory 02 saturation study/ and generating customized AVS unique to this office visit / same day charting  > 30 min final summary f/u ov

## 2022-05-29 ENCOUNTER — Other Ambulatory Visit: Payer: Self-pay | Admitting: *Deleted

## 2022-05-29 DIAGNOSIS — K7581 Nonalcoholic steatohepatitis (NASH): Secondary | ICD-10-CM

## 2022-05-29 DIAGNOSIS — R7989 Other specified abnormal findings of blood chemistry: Secondary | ICD-10-CM

## 2022-05-31 NOTE — Progress Notes (Signed)
Kindly inform the patient that carotid ultrasound study shows no significant blockages of either carotid artery in the neck

## 2022-06-22 ENCOUNTER — Ambulatory Visit (INDEPENDENT_AMBULATORY_CARE_PROVIDER_SITE_OTHER): Payer: 59 | Admitting: Neurology

## 2022-06-22 ENCOUNTER — Encounter: Payer: Self-pay | Admitting: Neurology

## 2022-06-22 VITALS — BP 166/93 | HR 119 | Ht 69.0 in | Wt 218.0 lb

## 2022-06-22 DIAGNOSIS — E669 Obesity, unspecified: Secondary | ICD-10-CM | POA: Diagnosis not present

## 2022-06-22 DIAGNOSIS — Z8673 Personal history of transient ischemic attack (TIA), and cerebral infarction without residual deficits: Secondary | ICD-10-CM | POA: Diagnosis not present

## 2022-06-22 DIAGNOSIS — R351 Nocturia: Secondary | ICD-10-CM

## 2022-06-22 DIAGNOSIS — G4719 Other hypersomnia: Secondary | ICD-10-CM | POA: Diagnosis not present

## 2022-06-22 DIAGNOSIS — Z8669 Personal history of other diseases of the nervous system and sense organs: Secondary | ICD-10-CM | POA: Diagnosis not present

## 2022-06-22 NOTE — Progress Notes (Signed)
Subjective:    [name] is a 72 y.o. female.  HPI    Huston Foley, MD, PhD [facility] [address] [address] [place], [place] [address]  Dear Janalyn Shy,   I saw your patient, Stephanie [name], upon your kind request in my sleep clinic today for initial consultation of her sleep disorder, in particular, concern for underlying obstructive sleep apnea.  The patient is unaccompanied today.  As you know, Stephanie [name] is a 72 year old female with an underlying complex medical history of DVT, on Eliquis, fibromyalgia, reflux disease, hypertension, hyperlipidemia, hypothyroidism, MGUS, type 2 diabetes with suboptimal control, anemia, arthritis, asthma, right frontal stroke in April 2023, and obesity, who reports snoring and excessive daytime somnolence.  He has a history of difficulty sleeping at night and has been on trazodone.  She takes 150 mg each night.  She is on other potentially sedating medications including Lyrica, hydroxyzine, which she takes for itching, also Benadryl which she also takes for itching.  She is also on Cymbalta, Robaxin as needed and Ultram as needed.  Her Epworth sleepiness score is 9/24, fatigue severity score is 46/63.  I reviewed your office note from 04/21/2022.  When she was hospitalized for her stroke in April 2023, she took part in an inpt sleep apnea evaluation study and tested positive for sleep apnea.  She is not aware of any family history of sleep apnea.  She lives with her daughter.  She does endorse snoring and not sleeping well at night.  She quit smoking in 1999.  She drinks alcohol occasionally, she drinks caffeine in the form of coffee but it is typically decaf, 1 cup in the morning.  Her weight has been more or less stable.  They have no pets at the house.  She has a TV on in her bedroom at night and it tends to stay on all night.  She has never been on PAP therapy, she would be willing to consider a PAP machine.  Her Past  Medical History Is Significant For: 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   Type 2 diabetes mellitus (HCC)     Her Past Surgical History Is Significant For: Past Surgical History:  Procedure Laterality Date   ABDOMINAL HYSTERECTOMY  partial   CARPAL TUNNEL RELEASE Right 1991   CATARACT EXTRACTION W/PHACO Right 05/08/2013   Procedure: CATARACT EXTRACTION PHACO AND INTRAOCULAR LENS PLACEMENT (IOC);  Surgeon: Gemma Payor, MD;  Location: [facility];  Service: Ophthalmology;  Laterality: Right;  CDE 10.31   CATARACT EXTRACTION W/PHACO Left 08/17/2013   Procedure: CATARACT EXTRACTION PHACO AND INTRAOCULAR LENS PLACEMENT (IOC);  Surgeon: Gemma Payor, MD;  Location: [facility];  Service: Ophthalmology;  Laterality: Left;  CDE:9.03   CHOLECYSTECTOMY  1971   COLONOSCOPY WITH PROPOFOL N/A 01/06/2016   Dr. Jena Gauss: diverticulosis    DENTAL SURGERY     ESOPHAGEAL BRUSHING  08/29/2019   Procedure: ESOPHAGEAL BRUSHING;  Surgeon: Corbin Ade, MD;  Location: [facility];  Service: Endoscopy;;   ESOPHAGOGASTRODUODENOSCOPY (EGD) WITH PROPOFOL N/A 01/06/2016   Dr. Jena Gauss: normal s/p empiric dilation    ESOPHAGOGASTRODUODENOSCOPY (EGD) WITH PROPOFOL N/A 08/29/2019   esophageal plaques vs medication residue adherent to tubular esophagus s/p KOH brushing and dilation. Medium-sized hiatal hernia. + for candida. Diflucan.    EYE SURGERY     IR BONE MARROW BIOPSY & ASPIRATION  03/31/2022   MALONEY DILATION N/A 01/06/2016   Procedure: Elease Hashimoto DILATION;   Surgeon: Corbin Ade, MD;  Location: [facility];  Service: Endoscopy;  Laterality: N/A;   MALONEY DILATION N/A 08/29/2019   Procedure: Elease Hashimoto DILATION;  Surgeon: Corbin Ade, MD;  Location: [facility];  Service: Endoscopy;  Laterality: N/A;   REVERSE SHOULDER ARTHROPLASTY Right 08/06/2017   Procedure: RIGHT REVERSE SHOULDER ARTHROPLASTY;  Surgeon: Beverely Low, MD;  Location[facility];  Service: Orthopedics;  Laterality: Right;   WRIST GANGLION EXCISION Left     Her Family History Is Significant For: Family History  Problem Relation Age of Onset   Hypertension Mother    Diabetes Mother    COPD Mother    Arthritis Mother    Diabetes Father    Arthritis Father    Dementia Father    CAD Father    Hypothyroidism Sister    Diabetes Brother    Colon cancer Niece    Colon polyps Neg Hx    Sleep apnea Neg Hx     Her Social History Is Significant For: Social History   Socioeconomic History   Marital status: Married    Spouse name: Not on file   Number of children: Not on file   Years of education: Not on file   Highest education level: Not on file  Occupational History   Not on file  Tobacco Use   Smoking status: Former    Packs/day: 0.25    Years: 30.00    Additional pack years: 0.00    Total pack years: 7.50    Types: Cigarettes    Quit date: 02/17/2011    Years since quitting: 11.3   Smokeless tobacco: Never  Vaping Use   Vaping Use: Never used  Substance and Sexual Activity   Alcohol use: Not Currently    Comment: rare   Drug use: No   Sexual activity: Not Currently    Birth control/protection: Surgical  Other Topics Concern   Not on file  Social History Narrative   Not on file   Social Determinants of Health   Financial Resource Strain: Not on file  Food Insecurity: Not on file  Transportation Needs: Not on file  Physical Activity: Not on file  Stress: Not on file  Social Connections: Not on file    Her Allergies Are:  Allergies  Allergen  Reactions   Tetracyclines & Related Anaphylaxis and Rash   Banana Hives and Nausea And Vomiting   Penicillins Rash and Other (See Comments)    Has patient had a PCN reaction causing immediate rash, facial/tongue/throat swelling, SOB or lightheadedness with hypotension: No Has patient had a PCN reaction causing severe rash involving mucus membranes or skin necrosis: No Has patient had a PCN reaction that required hospitalization No Has patient had a PCN reaction occurring within the last 10 years: No If all of the above answers are "NO", then may proceed with Cephalosporin use.   :   Her Current Medications Are:  Outpatient Encounter Medications as of  06/22/2022  Medication Sig   Accu-Chek FastClix Lancets MISC Apply topically.   ACCU-CHEK GUIDE test strip 4 (four) times daily.   acetaminophen (TYLENOL) 500 MG tablet Take 1,000 mg by mouth every 6 (six) hours as needed for moderate pain or headache.   albuterol (PROVENTIL) (2.5 MG/3ML) 0.083% nebulizer solution Take 2.5 mg by nebulization every 6 (six) hours as needed for wheezing or shortness of breath.   albuterol (VENTOLIN HFA) 108 (90 Base) MCG/ACT inhaler Inhale 1-2 puffs into the lungs every 6 (six) hours as needed for wheezing or shortness of breath.   Alcohol Swabs (ALCOHOL PADS) 70 % PADS SMARTSIG:Pledget(s) Topical 4 Times Daily   apixaban (ELIQUIS) 5 MG TABS tablet Take 1 tablet (5 mg total) by mouth 2 (two) times daily.   ARNUITY ELLIPTA 100 MCG/ACT AEPB Inhale 1 puff into the lungs at bedtime.    Ascorbic Acid (VITAMIN C) 1000 MG tablet Take 500 mg by mouth daily.   atorvastatin (LIPITOR) 80 MG tablet Take 1 tablet (80 mg total) by mouth daily.   bisoprolol (ZEBETA) 10 MG tablet TAKE 1 TABLET(10 MG) BY MOUTH IN THE MORNING AND AT BEDTIME   Blood Glucose Calibration (ACCU-CHEK GUIDE CONTROL) LIQD See admin instructions.   Blood Glucose Monitoring Suppl (ACCU-CHEK GUIDE ME) w/Device KIT 4 (four) times daily.   Cetirizine HCl 10 MG  CAPS Take 10 mg by mouth daily.   Cholecalciferol (VITAMIN D) 2000 units tablet Take 4,000 Units by mouth daily.    cimetidine (TAGAMET) 200 MG tablet Take 0.5 tablets (100 mg total) by mouth daily as needed. (Patient taking differently: Take 100 mg by mouth daily as needed (indigestion).)   CINNAMON PO Take 1-2 capsules by mouth 3 (three) times daily.   clobetasol ointment (TEMOVATE) 0.05 % Apply 1 application topically 3 (three) times daily as needed (imflammation).   diphenhydrAMINE (BENADRYL) 25 mg capsule Take 50 mg by mouth every 4 (four) hours as needed for itching.   DULoxetine (CYMBALTA) 60 MG capsule Take 60 mg by mouth 2 (two) times daily.   EASY COMFORT PEN NEEDLES 31G X 5 MM MISC INJECT IN SULIN EVERY DAY AS DIRECTED   empagliflozin (JARDIANCE) 10 MG TABS tablet Take 1 tablet (10 mg total) by mouth daily.   fluticasone (VERAMYST) 27.5 MCG/SPRAY nasal spray Place 2 sprays into the nose daily.   guaiFENesin (ROBITUSSIN) 100 MG/5ML SOLN Take 5 mLs (100 mg total) by mouth every 4 (four) hours as needed for cough or to loosen phlegm.   HUMALOG KWIKPEN 100 UNIT/ML KwikPen Inject 5-11 Units into the skin 3 (three) times daily. Sliding Scale (Patient taking differently: Inject 3 Units into the skin 3 (three) times daily. Sliding Scale)   hydrOXYzine (ATARAX/VISTARIL) 25 MG tablet Take 25 mg by mouth every 8 (eight) hours as needed for itching.    insulin degludec (TRESIBA FLEXTOUCH) 100 UNIT/ML FlexTouch Pen Inject 20 Units into the skin at bedtime.   ipratropium-albuterol (DUONEB) 0.5-2.5 (3) MG/3ML SOLN Take 3 mLs by nebulization every 6 (six) hours as needed (shortness of breath).   Lancet Devices (EASY MINI EJECT LANCING DEVICE) MISC 4 (four) times daily.   levothyroxine (SYNTHROID) 88 MCG tablet Take 88 mcg by mouth daily before breakfast.   lidocaine (LIDODERM) 5 % Place 1 patch onto the skin daily. Remove & Discard patch within 12 hours or as directed by MD   lubiprostone (AMITIZA) 24  MCG capsule Take 1 capsule (24 mcg total) by mouth 2 (two) times daily with a  meal.   LYRICA 75 MG capsule Take 75 mg by mouth 2 (two) times daily.    methocarbamol (ROBAXIN) 500 MG tablet Take 1 tablet (500 mg total) by mouth 3 (three) times daily as needed. (Patient taking differently: Take 500 mg by mouth 3 (three) times daily as needed for muscle spasms.)   montelukast (SINGULAIR) 10 MG tablet Take 10 mg by mouth at bedtime.   Omega-3 Fatty Acids (OMEGA 3 PO) Take 1 capsule by mouth daily.   pantoprazole (PROTONIX) 40 MG tablet Take 1 tablet (40 mg total) by mouth daily before breakfast.   potassium chloride (KLOR-CON) 10 MEQ tablet Take 10 mEq by mouth daily.   REPATHA SURECLICK 140 MG/ML SOAJ ADMINISTER 1 ML UNDER THE SKIN EVERY 14 DAYS   REPATHA SURECLICK 140 MG/ML SOAJ Inject 140 mg into the skin every 14 (fourteen) days.   RESTASIS 0.05 % ophthalmic emulsion Place 1 drop into both eyes 2 (two) times daily.   telmisartan (MICARDIS) 20 MG tablet Take 40 mg by mouth daily.   tirzepatide (MOUNJARO) 7.5 MG/0.5ML Pen Inject 7.5 mg into the skin once a week.   torsemide (DEMADEX) 20 MG tablet Take 2 tablets (40 mg total) by mouth daily.   traMADol (ULTRAM) 50 MG tablet Take 50 mg by mouth 3 (three) times daily as needed for moderate pain.   traZODone (DESYREL) 150 MG tablet Take 150 mg by mouth at bedtime.   vitamin B-12 (CYANOCOBALAMIN) 1000 MCG tablet Take 1,000 mcg by mouth daily.   No facility-administered encounter medications on file as of 06/22/2022.  :   Review of Systems:  Out of a complete 14 point review of systems, all are reviewed and negative with the exception of these symptoms as listed below:  Review of Systems  Neurological:        Pt here for sleep consult  Pt states snores,headaches,fatigue,some headaches Pt states took sleep study at [facility] with DR Pearlean Brownie . Pt admitted for strokes last year  Pt states 5 strokes    ESS:9 FSS:46         Objective:   Neurological Exam  Physical Exam Physical Examination:   Vitals:   06/22/22 1525  BP: (!) 166/93  Pulse: (!) 119    General Examination: The patient is a very pleasant 72 y.o. female in no acute distress. She appears well-developed and well-nourished and well groomed.   HEENT: Normocephalic, atraumatic, pupils are equal, round and reactive to light, extraocular tracking is good without limitation to gaze excursion or nystagmus noted. Hearing is grossly intact. Face is symmetric with normal facial animation. Speech is clear with no dysarthria noted. There is no hypophonia. There is no lip, neck/head, jaw or voice tremor. Neck is supple with full range of passive and active motion. There are no carotid bruits on auscultation. Oropharynx exam reveals: mild mouth dryness, adequate dental hygiene with full dentures on top and partial dentures on the bottom, moderate airway crowding secondary to redundant soft palate and small airway, tonsillar size of about 1+ bilaterally.  Mallampati class II.  Neck circumference 18 three-quarter inches.  Tongue protrudes centrally and palate elevates symmetrically.    Chest: Clear to auscultation without wheezing, rhonchi or crackles noted.  Heart: S1+S2+0, regular and normal without murmurs, rubs or gallops noted.   Abdomen: Soft, non-tender and non-distended.  Extremities: There is trace pitting edema in the distal lower extremities bilaterally.   Skin: Warm and dry without trophic changes noted.   Musculoskeletal: exam reveals  no obvious joint deformities, decreased range of motion right shoulder.   Neurologically:  Mental status: The patient is awake, alert and oriented in all 4 spheres. Her immediate and remote memory, attention, language skills and fund of knowledge are appropriate. There is no evidence of aphasia, agnosia, apraxia or anomia. Speech is clear with normal prosody and enunciation. Thought process is linear. Mood is normal and affect is  normal.  Cranial nerves II - XII are as described above under HEENT exam.  Motor exam: Normal bulk, strength and tone is noted. There is no obvious action or resting tremor.  Fine motor skills and coordination: grossly intact.  Cerebellar testing: No dysmetria or intention tremor. There is no truncal or gait ataxia.  Sensory exam: intact to light touch in the upper and lower extremities.  Gait, station and balance: She stands with mild effort in particular so far.  She walks with a single-point cane.  Assessment and Plan:  In summary, Stephanie [name] is a very pleasant 31 y.o.-year old female with an underlying complex medical history of DVT, on Eliquis, fibromyalgia, reflux disease, hypertension, hyperlipidemia, hypothyroidism, MGUS, type 2 diabetes with suboptimal control, anemia, arthritis, asthma, right frontal stroke in April 2023, and obesity, who presents for evaluation of her obstructive sleep apnea.  She tested positive with a sleep apnea screening test during her hospitalization in April 2023 when she was hospitalized for stroke.  She has never been on PAP therapy.  She would be willing to get retested with a home sleep test.  She would prefer a home sleep test as she is worried that she will not be able to sleep through the sleep lab.  I had a long chat with the patient about my findings and the diagnosis of sleep apnea, particularly OSA, its prognosis and treatment options. We talked about medical/conservative treatments, surgical interventions and non-pharmacological approaches for symptom control. I explained, in particular, the risks and ramifications of untreated moderate to severe OSA, especially with respect to developing cardiovascular disease down the road, including congestive heart failure (CHF), difficult to treat hypertension, cardiac arrhythmias (particularly A-fib), neurovascular complications including TIA, stroke and dementia. Even type 2 diabetes has, in part, been linked to  untreated OSA. Symptoms of untreated OSA may include (but may not be limited to) daytime sleepiness, nocturia (i.e. frequent nighttime urination), memory problems, mood irritability and suboptimally controlled or worsening mood disorder such as depression and/or anxiety, lack of energy, lack of motivation, physical discomfort, as well as recurrent headaches, especially morning or nocturnal headaches. We talked about the importance of maintaining a healthy lifestyle and striving for healthy weight.  In addition, we talked about the importance of striving for and maintaining good sleep hygiene. I recommended a sleep study at this time. I outlined the differences between a laboratory attended sleep study which is considered more comprehensive and accurate over the option of a home sleep test (HST); the latter may lead to underestimation of sleep disordered breathing in some instances and does not help with diagnosing upper airway resistance syndrome and is not accurate enough to diagnose primary central sleep apnea typically. I outlined possible surgical and non-surgical treatment options of OSA, including the use of a positive airway pressure (PAP) device (i.e. CPAP, AutoPAP/APAP or BiPAP in certain circumstances), a custom-made dental device (aka oral appliance, which would require a referral to a specialist dentist or orthodontist typically, and is generally speaking not considered for patients with full dentures or edentulous state), upper airway surgical options,  such as traditional UPPP (which is not considered a first-line treatment) or the Inspire device (hypoglossal nerve stimulator, which would involve a referral for consultation with an ENT surgeon, after careful selection, following inclusion criteria - also not first-line treatment). I explained the PAP treatment option to the patient in detail, as this is generally considered first-line treatment.  The patient indicated that she would be willing to try  PAP therapy, if the need arises. I explained the importance of being compliant with PAP treatment, not only for insurance purposes but primarily to improve patient's symptoms symptoms, and for the patient's long term health benefit, including to reduce Her cardiovascular risks longer-term.    We will pick up our discussion about the next steps and treatment options after testing.  We will keep her posted as to the test results by phone call and/or MyChart messaging where possible.  We will plan to follow-up in sleep clinic accordingly as well.  I answered all her questions today and the patient was in agreement.   I encouraged her to call with any interim questions, concerns, problems or updates or email [place] through MyChart.  Generally speaking, sleep test authorizations may take up to 2 weeks, sometimes less, sometimes longer, the patient is encouraged to get in touch with [place] if they do not hear back from the sleep lab staff directly within the next 2 weeks.  Thank you very much for allowing me to participate in the care of this nice patient. If I can be of any further assistance to you please do not hesitate to call me at [phone].  Sincerely,   Huston Foley, MD, PhD

## 2022-06-22 NOTE — Patient Instructions (Signed)

## 2022-06-23 ENCOUNTER — Ambulatory Visit (INDEPENDENT_AMBULATORY_CARE_PROVIDER_SITE_OTHER): Payer: 59 | Admitting: Nurse Practitioner

## 2022-06-23 ENCOUNTER — Encounter: Payer: Self-pay | Admitting: Nurse Practitioner

## 2022-06-23 VITALS — BP 136/78 | HR 89 | Ht 69.0 in | Wt 229.8 lb

## 2022-06-23 DIAGNOSIS — I10 Essential (primary) hypertension: Secondary | ICD-10-CM | POA: Diagnosis not present

## 2022-06-23 DIAGNOSIS — N1832 Chronic kidney disease, stage 3b: Secondary | ICD-10-CM | POA: Diagnosis not present

## 2022-06-23 DIAGNOSIS — E1122 Type 2 diabetes mellitus with diabetic chronic kidney disease: Secondary | ICD-10-CM

## 2022-06-23 DIAGNOSIS — Z794 Long term (current) use of insulin: Secondary | ICD-10-CM

## 2022-06-23 DIAGNOSIS — E782 Mixed hyperlipidemia: Secondary | ICD-10-CM | POA: Diagnosis not present

## 2022-06-23 DIAGNOSIS — E039 Hypothyroidism, unspecified: Secondary | ICD-10-CM

## 2022-06-23 LAB — POCT GLYCOSYLATED HEMOGLOBIN (HGB A1C): Hemoglobin A1C: 8.2 % — AB (ref 4.0–5.6)

## 2022-06-23 MED ORDER — EMPAGLIFLOZIN 10 MG PO TABS: 10.0000 mg | ORAL_TABLET | Freq: Every day | ORAL | 3 refills | Status: DC

## 2022-06-23 NOTE — Progress Notes (Signed)
Endocrinology Follow Up Note       06/23/2022, 10:33 AM   Subjective:    [name], female    DOB: 1950-11-08.  Stephanie [name] is being seen in follow up after being seen in consultation for management of currently uncontrolled symptomatic diabetes requested by  Marylynn Pearson, FNP.   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   Type 2 diabetes mellitus (HCC)     Past Surgical History:  Procedure Laterality Date   ABDOMINAL HYSTERECTOMY  partial   CARPAL TUNNEL RELEASE Right 1991   CATARACT EXTRACTION W/PHACO Right 05/08/2013   Procedure: CATARACT EXTRACTION PHACO AND INTRAOCULAR LENS PLACEMENT (IOC);  Surgeon: Gemma Payor, MD;  Location: [facility];  Service: Ophthalmology;  Laterality: Right;  CDE 10.31   CATARACT EXTRACTION W/PHACO Left 08/17/2013   Procedure: CATARACT EXTRACTION PHACO AND INTRAOCULAR LENS PLACEMENT (IOC);  Surgeon: Gemma Payor, MD;  Location: [facility];  Service: Ophthalmology;  Laterality: Left;  CDE:9.03   CHOLECYSTECTOMY  1971   COLONOSCOPY WITH PROPOFOL N/A 01/06/2016   Dr. Jena Gauss: diverticulosis    DENTAL SURGERY     ESOPHAGEAL BRUSHING  08/29/2019   Procedure: ESOPHAGEAL BRUSHING;  Surgeon: Corbin Ade, MD;  Location: [facility];  Service: Endoscopy;;   ESOPHAGOGASTRODUODENOSCOPY (EGD) WITH PROPOFOL N/A 01/06/2016   Dr. Jena Gauss: normal s/p empiric dilation    ESOPHAGOGASTRODUODENOSCOPY (EGD) WITH PROPOFOL N/A 08/29/2019    esophageal plaques vs medication residue adherent to tubular esophagus s/p KOH brushing and dilation. Medium-sized hiatal hernia. + for candida. Diflucan.    EYE SURGERY     IR BONE MARROW BIOPSY & ASPIRATION  03/31/2022   MALONEY DILATION N/A 01/06/2016   Procedure: Elease Hashimoto DILATION;  Surgeon: Corbin Ade, MD;  Location: [facility];  Service: Endoscopy;  Laterality: N/A;   MALONEY DILATION N/A 08/29/2019   Procedure: Elease Hashimoto DILATION;  Surgeon: Corbin Ade, MD;  Location: [facility];  Service: Endoscopy;  Laterality: N/A;   REVERSE SHOULDER ARTHROPLASTY Right 08/06/2017   Procedure: RIGHT REVERSE SHOULDER ARTHROPLASTY;  Surgeon: Beverely Low, MD;  Location: [facility];  Service: Orthopedics;  Laterality: Right;   WRIST GANGLION EXCISION Left     Social History   Socioeconomic History   Marital status: Married    Spouse name: Not on file   Number of children: Not on file   Years of education: Not on file   Highest education level: Not on file  Occupational History   Not on file  Tobacco Use   Smoking status: Former    Packs/day: 0.25    Years: 30.00    Additional pack years:  0.00    Total pack years: 7.50    Types: Cigarettes    Quit date: 02/17/2011    Years since quitting: 11.3   Smokeless tobacco: Never  Vaping Use   Vaping Use: Never used  Substance and Sexual Activity   Alcohol use: Not Currently    Comment: rare   Drug use: No   Sexual activity: Not Currently    Birth control/protection: Surgical  Other Topics Concern   Not on file  Social History Narrative   Not on file   Social Determinants of Health   Financial Resource Strain: Not on file  Food Insecurity: Not on file  Transportation Needs: Not on file  Physical Activity: Not on file  Stress: Not on file  Social Connections: Not on file    Family History  Problem Relation Age of Onset   Hypertension Mother    Diabetes Mother    COPD Mother    Arthritis Mother    Diabetes Father    Arthritis  Father    Dementia Father    CAD Father    Hypothyroidism Sister    Diabetes Brother    Colon cancer Niece    Colon polyps Neg Hx    Sleep apnea Neg Hx     Outpatient Encounter Medications as of 06/23/2022  Medication Sig   Accu-Chek FastClix Lancets MISC Apply topically.   ACCU-CHEK GUIDE test strip 4 (four) times daily.   acetaminophen (TYLENOL) 500 MG tablet Take 1,000 mg by mouth every 6 (six) hours as needed for moderate pain or headache.   albuterol (PROVENTIL) (2.5 MG/3ML) 0.083% nebulizer solution Take 2.5 mg by nebulization every 6 (six) hours as needed for wheezing or shortness of breath.   albuterol (VENTOLIN HFA) 108 (90 Base) MCG/ACT inhaler Inhale 1-2 puffs into the lungs every 6 (six) hours as needed for wheezing or shortness of breath.   Alcohol Swabs (ALCOHOL PADS) 70 % PADS SMARTSIG:Pledget(s) Topical 4 Times Daily   apixaban (ELIQUIS) 5 MG TABS tablet Take 1 tablet (5 mg total) by mouth 2 (two) times daily.   ARNUITY ELLIPTA 100 MCG/ACT AEPB Inhale 1 puff into the lungs at bedtime.    Ascorbic Acid (VITAMIN C) 1000 MG tablet Take 500 mg by mouth daily.   atorvastatin (LIPITOR) 80 MG tablet Take 1 tablet (80 mg total) by mouth daily.   bisoprolol (ZEBETA) 10 MG tablet TAKE 1 TABLET(10 MG) BY MOUTH IN THE MORNING AND AT BEDTIME   Blood Glucose Calibration (ACCU-CHEK GUIDE CONTROL) LIQD See admin instructions.   Blood Glucose Monitoring Suppl (ACCU-CHEK GUIDE ME) w/Device KIT 4 (four) times daily.   Cetirizine HCl 10 MG CAPS Take 10 mg by mouth daily.   Cholecalciferol (VITAMIN D) 2000 units tablet Take 4,000 Units by mouth daily.    cimetidine (TAGAMET) 200 MG tablet Take 0.5 tablets (100 mg total) by mouth daily as needed. (Patient taking differently: Take 100 mg by mouth daily as needed (indigestion).)   CINNAMON PO Take 1-2 capsules by mouth 3 (three) times daily.   clobetasol ointment (TEMOVATE) 0.05 % Apply 1 application topically 3 (three) times daily as needed  (imflammation).   diphenhydrAMINE (BENADRYL) 25 mg capsule Take 50 mg by mouth every 4 (four) hours as needed for itching.   DULoxetine (CYMBALTA) 60 MG capsule Take 60 mg by mouth 2 (two) times daily.   EASY COMFORT PEN NEEDLES 31G X 5 MM MISC INJECT IN SULIN EVERY DAY AS DIRECTED   fluticasone (VERAMYST) 27.5 MCG/SPRAY  nasal spray Place 2 sprays into the nose daily.   guaiFENesin (ROBITUSSIN) 100 MG/5ML SOLN Take 5 mLs (100 mg total) by mouth every 4 (four) hours as needed for cough or to loosen phlegm.   HUMALOG KWIKPEN 100 UNIT/ML KwikPen Inject 5-11 Units into the skin 3 (three) times daily. Sliding Scale (Patient taking differently: Inject 3 Units into the skin 3 (three) times daily. Sliding Scale)   hydrOXYzine (ATARAX/VISTARIL) 25 MG tablet Take 25 mg by mouth every 8 (eight) hours as needed for itching.    insulin degludec (TRESIBA FLEXTOUCH) 100 UNIT/ML FlexTouch Pen Inject 20 Units into the skin at bedtime.   ipratropium-albuterol (DUONEB) 0.5-2.5 (3) MG/3ML SOLN Take 3 mLs by nebulization every 6 (six) hours as needed (shortness of breath).   Lancet Devices (EASY MINI EJECT LANCING DEVICE) MISC 4 (four) times daily.   levothyroxine (SYNTHROID) 88 MCG tablet Take 88 mcg by mouth daily before breakfast.   lidocaine (LIDODERM) 5 % Place 1 patch onto the skin daily. Remove & Discard patch within 12 hours or as directed by MD   lubiprostone (AMITIZA) 24 MCG capsule Take 1 capsule (24 mcg total) by mouth 2 (two) times daily with a meal.   LYRICA 75 MG capsule Take 75 mg by mouth 2 (two) times daily.    methocarbamol (ROBAXIN) 500 MG tablet Take 1 tablet (500 mg total) by mouth 3 (three) times daily as needed. (Patient taking differently: Take 500 mg by mouth 3 (three) times daily as needed for muscle spasms.)   montelukast (SINGULAIR) 10 MG tablet Take 10 mg by mouth at bedtime.   Omega-3 Fatty Acids (OMEGA 3 PO) Take 1 capsule by mouth daily.   pantoprazole (PROTONIX) 40 MG tablet Take 1  tablet (40 mg total) by mouth daily before breakfast.   potassium chloride (KLOR-CON) 10 MEQ tablet Take 10 mEq by mouth daily.   REPATHA SURECLICK 140 MG/ML SOAJ ADMINISTER 1 ML UNDER THE SKIN EVERY 14 DAYS   REPATHA SURECLICK 140 MG/ML SOAJ Inject 140 mg into the skin every 14 (fourteen) days.   RESTASIS 0.05 % ophthalmic emulsion Place 1 drop into both eyes 2 (two) times daily.   telmisartan (MICARDIS) 20 MG tablet Take 40 mg by mouth daily.   tirzepatide (MOUNJARO) 7.5 MG/0.5ML Pen Inject 7.5 mg into the skin once a week.   torsemide (DEMADEX) 20 MG tablet Take 2 tablets (40 mg total) by mouth daily.   traMADol (ULTRAM) 50 MG tablet Take 50 mg by mouth 3 (three) times daily as needed for moderate pain.   traZODone (DESYREL) 150 MG tablet Take 150 mg by mouth at bedtime.   vitamin B-12 (CYANOCOBALAMIN) 1000 MCG tablet Take 1,000 mcg by mouth daily.   [DISCONTINUED] empagliflozin (JARDIANCE) 10 MG TABS tablet Take 1 tablet (10 mg total) by mouth daily.   empagliflozin (JARDIANCE) 10 MG TABS tablet Take 1 tablet (10 mg total) by mouth daily.   No facility-administered encounter medications on file as of 06/23/2022.    ALLERGIES: Allergies  Allergen Reactions   Tetracyclines & Related Anaphylaxis and Rash   Banana Hives and Nausea And Vomiting   Penicillins Rash and Other (See Comments)    Has patient had a PCN reaction causing immediate rash, facial/tongue/throat swelling, SOB or lightheadedness with hypotension: No Has patient had a PCN reaction causing severe rash involving mucus membranes or skin necrosis: No Has patient had a PCN reaction that required hospitalization No Has patient had a PCN reaction occurring within the last  10 years: No If all of the above answers are "NO", then may proceed with Cephalosporin use.     VACCINATION STATUS: Immunization History  Administered Date(s) Administered   Fluad Quad(high Dose 65+) 11/08/2018, 12/12/2020   Influenza,inj,Quad PF,6+ Mos  12/24/2015   Influenza-Unspecified 12/02/2016   Pneumococcal Polysaccharide-23 10/22/2013    Diabetes She presents for her follow-up diabetic visit. She has type 2 diabetes mellitus. Onset time: Diagnosed at approx age of 73. Her disease course has been fluctuating. There are no hypoglycemic associated symptoms. Associated symptoms include fatigue, polydipsia and polyuria. There are no hypoglycemic complications. Symptoms are improving. Diabetic complications include a CVA, heart disease and nephropathy. Risk factors for coronary artery disease include diabetes mellitus, dyslipidemia, family history, hypertension, post-menopausal and sedentary lifestyle. Current diabetic treatment includes intensive insulin program and oral agent (monotherapy) (and Mounjaro). She is compliant with treatment most of the time. Her weight is fluctuating minimally. She is following a generally healthy diet. When asked about meal planning, she reported none. She has not had a previous visit with a dietitian. She rarely participates in exercise. Her home blood glucose trend is fluctuating dramatically. Her overall blood glucose range is 180-200 mg/dl. (She presents today with her CGM and meter showing stable slightly above target glycemic profile.  Her POCT A1c today is 8.2%, improving from last visit of 8.8%.  Analysis of her CGM shows TIR 38%, TAR 62%, TBR 0% with a GMI of 8.1%.  She just returned from a 2 week vacation in [place].  She denies any significant hypoglycemia.  She does admit she has missed an injection or 2 of her insulin and was out of her Stephanie [name] for about a month due to drug shortage.) An ACE inhibitor/angiotensin II receptor blocker is not being taken. She sees a podiatrist.Eye exam is current.     Review of systems  Constitutional: + minimally fluctuating body weight, current Body mass index is 33.94 kg/m., no fatigue, no subjective hyperthermia, no subjective hypothermia Eyes: no blurry vision, no  xerophthalmia ENT: no sore throat, no nodules palpated in throat, no dysphagia/odynophagia, no hoarseness Cardiovascular: no chest pain, no shortness of breath, no palpitations, no leg swelling Respiratory: no cough, no shortness of breath Gastrointestinal: no nausea/vomiting/diarrhea Musculoskeletal: no muscle/joint aches, walks with cane Skin: no rashes, no hyperemia Neurological: no tremors, no numbness, no tingling, no dizziness Psychiatric: no depression, no anxiety  Objective:     BP 136/78 (BP Location: Right Arm, Patient Position: Sitting, Cuff Size: Large)   Pulse 89   Ht 5\' 9"  (1.753 m)   Wt 229 lb 12.8 oz (104.2 kg)   BMI 33.94 kg/m   Wt Readings from Last 3 Encounters:  06/23/22 229 lb 12.8 oz (104.2 kg)  06/22/22 218 lb (98.9 kg)  05/27/22 230 lb 6.4 oz (104.5 kg)     BP Readings from Last 3 Encounters:  06/23/22 136/78  06/22/22 (!) 166/93  05/27/22 109/62     Physical Exam- Limited  Constitutional:  Body mass index is 33.94 kg/m. , not in acute distress, normal state of mind Eyes:  EOMI, no exophthalmos Neck: Supple Cardiovascular: RRR, no murmurs, rubs, or gallops, no edema Respiratory: Adequate breathing efforts, no crackles, rales, rhonchi, or wheezing Musculoskeletal: no gross deformities, strength intact in all four extremities, no gross restriction of joint movements, walks with cane Skin:  no rashes, no hyperemia Neurological: no tremor with outstretched hands   Diabetic Foot Exam - Simple   No data filed  CMP ( most recent) CMP     Component Value Date/Time   NA 130 (L) 03/10/2022 1414   K 4.3 03/10/2022 1414   CL 99 03/10/2022 1414   CO2 23 03/10/2022 1414   GLUCOSE 335 (H) 03/10/2022 1414   BUN 16 03/10/2022 1414   CREATININE 1.24 (H) 03/10/2022 1414   CALCIUM 8.5 (L) 03/10/2022 1414   CALCIUM 8.9 08/04/2017 1032   PROT 8.0 05/20/2022 1436   PROT 8.1 12/16/2015 1625   ALBUMIN 3.0 (L) 03/10/2022 1414   ALBUMIN 3.7  12/16/2015 1625   AST 52 (H) 05/20/2022 1436   ALT 62 (H) 05/20/2022 1436   ALKPHOS 356 (H) 03/10/2022 1414   BILITOT 0.5 05/20/2022 1436   BILITOT 0.2 12/16/2015 1625   GFRNONAA 47 (L) 03/10/2022 1414   GFRAA 54 (L) 09/05/2019 1021     Diabetic Labs (most recent): Lab Results  Component Value Date   HGBA1C 8.2 (A) 06/23/2022   HGBA1C 8.8 (H) 01/29/2022   HGBA1C 8.5 (A) 12/22/2021   MICROALBUR 5,373.1 (H) 04/03/2021   MICROALBUR 598.9 (H) 12/04/2019     Lipid Panel ( most recent) Lipid Panel     Component Value Date/Time   CHOL 150 01/29/2022 1231   CHOL 210 (H) 09/09/2021 1141   TRIG 130 01/29/2022 1231   HDL 72 01/29/2022 1231   HDL 63 09/09/2021 1141   CHOLHDL 2.1 01/29/2022 1231   VLDL 26 01/29/2022 1231   LDLCALC 52 01/29/2022 1231   LDLCALC 125 (H) 09/09/2021 1141   LABVLDL 22 09/09/2021 1141      Lab Results  Component Value Date   TSH 1.934 01/29/2022   TSH 1.824 10/16/2021   TSH 2.213 07/24/2021   TSH 3.483 04/03/2021   TSH 2.253 11/28/2020   TSH 0.660 09/30/2020   TSH 0.559 06/28/2020   TSH 2.715 12/04/2019   TSH 1.267 06/05/2019   TSH 0.133 (L) 03/06/2019           Assessment & Plan:   1) Type 2 diabetes mellitus with stage 3a chronic kidney disease, with long-term current use of insulin (HCC)  She presents today with her CGM and meter showing stable slightly above target glycemic profile.  Her POCT A1c today is 8.2%, improving from last visit of 8.8%.  Analysis of her CGM shows TIR 38%, TAR 62%, TBR 0% with a GMI of 8.1%.  She just returned from a 2 week vacation in [place].  She denies any significant hypoglycemia.  She does admit she has missed an injection or 2 of her insulin and was out of her Stephanie [name] for about a month due to drug shortage.  Stephanie [name] has currently uncontrolled symptomatic type 2 DM since 72 years of age.   -Recent labs reviewed.  - I had a long discussion with her about the progressive nature of diabetes and the  pathology behind its complications. -her diabetes is complicated by CHF, CKD stage 3, CVA x 5 and she remains at a high risk for more acute and chronic complications which include CAD, CVA, CKD, retinopathy, and neuropathy. These are all discussed in detail with her.  The following Lifestyle Medicine recommendations according to American College of Lifestyle Medicine [facility]) were discussed and offered to patient and she agrees to start the journey:  A. Whole Foods, Plant-based plate comprising of fruits and vegetables, plant-based proteins, whole-grain carbohydrates was discussed in detail with the patient.   A list for source of those nutrients were also provided  to the patient.  Patient will use only water or unsweetened tea for hydration. B.  The need to stay away from risky substances including alcohol, smoking; obtaining 7 to 9 hours of restorative sleep, at least 150 minutes of moderate intensity exercise weekly, the importance of healthy social connections,  and stress reduction techniques were discussed. C.  A full color page of  Calorie density of various food groups per pound showing examples of each food groups was provided to the patient.  - Nutritional counseling repeated at each appointment due to patients tendency to fall back in to old habits.  - The patient admits there is a room for improvement in their diet and drink choices. -  Suggestion is made for the patient to avoid simple carbohydrates from their diet including Cakes, Sweet Desserts / Pastries, Ice Cream, Soda (diet and regular), Sweet Tea, Candies, Chips, Cookies, Sweet Pastries, Store Bought Juices, Alcohol in Excess of 1-2 drinks a day, Artificial Sweeteners, Coffee Creamer, and "Sugar-free" Products. This will help patient to have stable blood glucose profile and potentially avoid unintended weight gain.   - I encouraged the patient to switch to unprocessed or minimally processed complex starch and increased protein intake  (animal or plant source), fruits, and vegetables.   - Patient is advised to stick to a routine mealtimes to eat 3 meals a day and avoid unnecessary snacks (to snack only to correct hypoglycemia).  - I have approached her with the following individualized plan to manage her diabetes and patient agrees:   -She is advised to continue her Tresiba 20 units SQ nightly, Humalog 5-11 units TID with meals if glucose is above 90 and she is eating (Specific instructions on how to titrate insulin dosage based on glucose readings given to patient in writing), continue Jardiance 10 mg po daily and continue her Mounjaro 7.5 mg SQ weekly (will hold off on advancing due to drug shortage in higher dosages.  The ultimate goal would be to further simplify her regimen and get her off insulin in the future by increasing her GLP1.   -she is encouraged to continue monitoring glucose 4 times daily (using her CGM), before meals and before bed, and to call the clinic if she has readings less than 70 or above 300 for 3 tests in a row.  - she is warned not to take insulin without proper monitoring per orders. - Adjustment parameters are given to her for hypo and hyperglycemia in writing.  - she is not a candidate for Metformin due to concurrent renal insufficiency.  - Specific targets for  A1c; LDL, HDL, and Triglycerides were discussed with the patient.  2) Blood Pressure /Hypertension:  her blood pressure is controlled to target for her age.   she is advised to continue her current medications including Bisoprolol 10 mg p.o. daily with breakfast.  3) Lipids/Hyperlipidemia:    Review of her recent lipid panel from 01/29/22 showed controlled LDL at 52 .  she is advised to continue Lipitor 80 mg daily at bedtime.  Side effects and precautions discussed with her.  4)  Weight/Diet:  her Body mass index is 33.94 kg/m.  -  clearly complicating her diabetes care.   she is a candidate for weight loss. I discussed with her the  fact that loss of 5 - 10% of her  current body weight will have the most impact on her diabetes management.  Exercise, and detailed carbohydrates information provided  -  detailed on discharge instructions.  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  She is currently on Levothyroxine 88 mcg po daily before breakfast.  Will recheck TFTs prior to next visit and adjust dose accordingly.  - The correct intake of thyroid hormone (Levothyroxine, Synthroid), is on empty stomach first thing in the morning, with water, separated by at least 30 minutes from breakfast and other medications,  and separated by more than 4 hours from calcium, iron, multivitamins, acid reflux medications (PPIs).  - This medication is a life-long medication and will be needed to correct thyroid hormone imbalances for the rest of your life.  The dose may change from time to time, based on thyroid blood work.  - It is extremely important to be consistent taking this medication, near the same time each morning.  -AVOID TAKING PRODUCTS CONTAINING BIOTIN (commonly found in Hair, Skin, Nails vitamins) AS IT INTERFERES WITH THE VALIDITY OF THYROID FUNCTION BLOOD TESTS.  6) Chronic Care/Health Maintenance: -she is not on ACEI/ARB and is on Statin medications and is encouraged to initiate and continue to follow up with Ophthalmology, Dentist, Podiatrist at least yearly or according to recommendations, and advised to stay away from smoking. I have recommended yearly flu vaccine and pneumonia vaccine at least every 5 years; moderate intensity exercise for up to 150 minutes weekly; and sleep for at least 7 hours a day.  - she is advised to maintain close follow up with Marylynn Pearson, FNP for primary care needs, as well as her other providers for optimal and coordinated care.     I spent  33  minutes in the care of  the patient today including review of labs from CMP, Lipids, Thyroid Function, Hematology (current and previous including abstractions from other facilities); face-to-face time discussing  her blood glucose readings/logs, discussing hypoglycemia and hyperglycemia episodes and symptoms, medications doses, her options of short and long term treatment based on the latest standards of care / guidelines;  discussion about incorporating lifestyle medicine;  and documenting the encounter. Risk reduction counseling performed per USPSTF guidelines to reduce obesity and cardiovascular risk factors.     Please refer to Patient Instructions for Blood Glucose Monitoring and Insulin/Medications Dosing Guide"  in media tab for additional information. Please  also refer to " Patient Self Inventory" in the Media  tab for reviewed elements of pertinent patient history.  Stephanie [name] participated in the discussions, expressed understanding, and voiced agreement with the above plans.  All questions were answered to her satisfaction. she is encouraged to contact clinic should she have any questions or concerns prior to her return visit.     Follow up plan: - Return in about 3 months (around 09/23/2022) for Diabetes F/U with A1c in office, Thyroid follow up, Previsit labs, Bring meter and logs.   Ronny Bacon, [facility] [address] [place], [place] [address] Phone: [phone] Fax: [phone]  06/23/2022, 10:33 AM

## 2022-06-25 ENCOUNTER — Telehealth: Payer: Self-pay | Admitting: Neurology

## 2022-06-25 NOTE — Telephone Encounter (Signed)
HST- [employer] medicare/medicaid no auth due to secondary insurance   Patient is scheduled at [facility] for 07/22/22 at 3:30 pm.  Mailed packet to the patient.

## 2022-06-29 ENCOUNTER — Encounter: Payer: Self-pay | Admitting: Gastroenterology

## 2022-07-02 ENCOUNTER — Telehealth: Payer: Self-pay | Admitting: *Deleted

## 2022-07-02 NOTE — Telephone Encounter (Signed)
Patient's pharmacist called and left a message that the patient had been without the [facility] for a while due to the back order problem. She said that there are 2 alternatives, Ozempic and Trulicity.She ask if one of these would be good for the patient.

## 2022-07-02 NOTE — Telephone Encounter (Signed)
I believe both those medications are in a shortage as well.  I would prefer maybe lowering her dose of Mounjaro to 5 mg which is more readily available than the higher doses of Mounjaro.  The shortage is predicted to be ironed out by summer, the next month or so.

## 2022-07-03 NOTE — Telephone Encounter (Signed)
Patient was called and made aware. She is going to call her pharmacy and see if they may have a lower dose of the Mounjaro hand. She will let us know.

## 2022-07-09 ENCOUNTER — Inpatient Hospital Stay: Payer: 59 | Attending: Hematology

## 2022-07-09 DIAGNOSIS — D472 Monoclonal gammopathy: Secondary | ICD-10-CM | POA: Diagnosis present

## 2022-07-09 DIAGNOSIS — D72825 Bandemia: Secondary | ICD-10-CM

## 2022-07-09 DIAGNOSIS — D509 Iron deficiency anemia, unspecified: Secondary | ICD-10-CM

## 2022-07-09 DIAGNOSIS — N183 Chronic kidney disease, stage 3 unspecified: Secondary | ICD-10-CM

## 2022-07-09 LAB — CBC WITH DIFFERENTIAL/PLATELET
Abs Immature Granulocytes: 0.08 10*3/uL — ABNORMAL HIGH (ref 0.00–0.07)
Basophils Absolute: 0 10*3/uL (ref 0.0–0.1)
Basophils Relative: 0 %
Eosinophils Absolute: 0.4 10*3/uL (ref 0.0–0.5)
Eosinophils Relative: 4 %
HCT: 41.6 % (ref 36.0–46.0)
Hemoglobin: 12.5 g/dL (ref 12.0–15.0)
Immature Granulocytes: 1 %
Lymphocytes Relative: 27 %
Lymphs Abs: 2.5 10*3/uL (ref 0.7–4.0)
MCH: 26.5 pg (ref 26.0–34.0)
MCHC: 30 g/dL (ref 30.0–36.0)
MCV: 88.1 fL (ref 80.0–100.0)
Monocytes Absolute: 0.6 10*3/uL (ref 0.1–1.0)
Monocytes Relative: 6 %
Neutro Abs: 5.8 10*3/uL (ref 1.7–7.7)
Neutrophils Relative %: 62 %
Platelets: 200 10*3/uL (ref 150–400)
RBC: 4.72 MIL/uL (ref 3.87–5.11)
RDW: 13.9 % (ref 11.5–15.5)
WBC: 9.4 10*3/uL (ref 4.0–10.5)
nRBC: 0 % (ref 0.0–0.2)

## 2022-07-09 LAB — LACTATE DEHYDROGENASE: LDH: 184 U/L (ref 98–192)

## 2022-07-09 LAB — COMPREHENSIVE METABOLIC PANEL
ALT: 46 U/L — ABNORMAL HIGH (ref 0–44)
AST: 35 U/L (ref 15–41)
Albumin: 3.2 g/dL — ABNORMAL LOW (ref 3.5–5.0)
Alkaline Phosphatase: 339 U/L — ABNORMAL HIGH (ref 38–126)
Anion gap: 6 (ref 5–15)
BUN: 35 mg/dL — ABNORMAL HIGH (ref 8–23)
CO2: 24 mmol/L (ref 22–32)
Calcium: 8.6 mg/dL — ABNORMAL LOW (ref 8.9–10.3)
Chloride: 104 mmol/L (ref 98–111)
Creatinine, Ser: 1.51 mg/dL — ABNORMAL HIGH (ref 0.44–1.00)
GFR, Estimated: 37 mL/min — ABNORMAL LOW (ref >60)
Glucose, Bld: 259 mg/dL — ABNORMAL HIGH (ref 70–99)
Potassium: 4.6 mmol/L (ref 3.5–5.1)
Sodium: 134 mmol/L — ABNORMAL LOW (ref 135–145)
Total Bilirubin: 0.5 mg/dL (ref 0.3–1.2)
Total Protein: 8.9 g/dL — ABNORMAL HIGH (ref 6.5–8.1)

## 2022-07-10 ENCOUNTER — Inpatient Hospital Stay: Payer: 59

## 2022-07-15 LAB — PROTEIN ELECTROPHORESIS, SERUM
A/G Ratio: 0.6 — ABNORMAL LOW (ref 0.7–1.7)
Albumin ELP: 3.1 g/dL (ref 2.9–4.4)
Alpha-1-Globulin: 0.3 g/dL (ref 0.0–0.4)
Alpha-2-Globulin: 1.1 g/dL — ABNORMAL HIGH (ref 0.4–1.0)
Beta Globulin: 1.3 g/dL (ref 0.7–1.3)
Gamma Globulin: 2.5 g/dL — ABNORMAL HIGH (ref 0.4–1.8)
Globulin, Total: 5.3 g/dL — ABNORMAL HIGH (ref 2.2–3.9)
M-Spike, %: 2 g/dL — ABNORMAL HIGH
Total Protein ELP: 8.4 g/dL (ref 6.0–8.5)

## 2022-07-17 NOTE — Progress Notes (Unsigned)
[facility] [address], [place] [address]   CLINIC:  Medical Oncology/Hematology  PCP:  Marylynn Pearson, FNP [address] [place] [address] [phone]   REASON FOR VISIT:  Follow-up for MGUS, history of DVT, leukocytosis, microcytic anemia  CURRENT THERAPY: Eliquis for DVT.  Surveillance.  INTERVAL HISTORY:   [name] 72 y.o. 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  She was last seen by Rojelio Brenner PA-C on 04/14/2022.  At today's visit, she reports feeling fair apart from ongoing fatigue.  She continues to experience low back pain with radiation down her left leg associated with left leg numbness and tingling.  Her chronic fatigue remains unchanged.  She continues to have left hand numbness and intermittent numbness in her feet and legs.  She has not noticed any new lumps or bumps.  She had fever up to 100.0 F last week while fighting off an episode of acute bronchitis.  For the last two weeks in April she had some drenching night sweats.  No shaking chills or unintentional weight loss.  She reports that she has poor appetite and feels full after a few bites.  No nausea of abdominal pain.  She continues to take Eliquis for history of DVT.  She denies any current signs or symptoms of DVT or PE.  Chronic dyspnea on exertion is stable at baseline.  She denies any major bleeding events such as epistaxis, hematemesis, hematochezia, or melena.   She has little to no energy and 50% appetite. She endorses that she is maintaining a stable weight.  ASSESSMENT & PLAN:  1.  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 - History of nephrotic range proteinuria, resolved now. - Skeletal survey (03/10/2022): Possible mottled lucency of the anterior aspect of the L1 vertebral body and  mildly increased lucency of the inferior endplate of L2, requires additional dedicated cross-sectional imaging for better characterization CT lumbar spine (04/07/2022): Lytic lesion along superior endplate of L2 vertebral body and may be degenerative in etiology.  No definite suspicious lytic or blastic osseous lesions. MRI lumbar spine (05/08/2022) was negative for evidence of multiple myeloma, but showed multilevel Schmorl's nodes felt to account for lucency within the L2 superior endplate seen on CT, as well as mildly progressive multilevel spondylosis. - Most recent myeloma panel (07/09/2022) continues to show labs trending upwards: M-spike trending upwards at 2.0 (significantly elevated from M spike around 1.3 one year ago) Immunofixation PENDING Free light chains (last checked 03/10/2022) remain elevated but relatively stable with elevated kappa 790.4, normal lambda 19.0, and stable but elevated ratio 41.60. No CRAB features: Calcium 8.6.  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 Normal Hgb 12.5. LDH normal - Bone marrow biopsy (03/31/2022): Hypercellular bone marrow for age with plasmacytosis (plasma cells 13% of all cells).  Minor B-cell population with kappa light chain excess.  Per pathology report, unclear whether findings represent lymphoid component of a neoplastic lymphoplasmacytic disorder or an independent disease process such as monoclonal B-cell lymphocytosis. Molecular pathology was negative for any abnormal mutations Cytogenetics showed normal female karyotype (46,XX[20]) MYD88 mutation detected [c.794T>C (p.L265P)] - this mutation favors diagnosis of lymphoplasmacytic lymphoma, and may indicate higher risk of disease progression and greater disease burden  - Reports increased low back pain with radiating pain and numbness/tingling going down her  left leg. - Recent fever (Tmax 100.0 F) associated with acute bronchitis.  Drenching night sweats x 2 weeks  in April 2024.  No shaking chills or unintentional weight loss.  Reports early satiety; no nausea or abdominal pain. - No lymphadenopathy or palpable splenomegaly on exam. - DISCUSSION: Based on most recent bone marrow biopsy results (13% plasma cells), patient now meets criteria for SMOLDERING MYELOMA presence of IgG kappa monoclonal gammopathy and absence of CRAB features.  However, clonal B-cell population and presence of MYD88 mutation show evidence of LYMPHOPLASMACYTIC LYMPHOMA (non-IgM).  Differential diagnosis includes rare cooccurrence of smoldering myeloma (IgG) AND lymphoplasmacytic lymphoma ** OR ** rare variant of WM with IgG secretion. - PLAN: Per Dr. Ellin Saba, active surveillance is appropriate at this time. - We will check CT CAP for baseline assessment of lymphadenopathy or hepatosplenomegaly. - Check updated 24-hour urine/UPEP/U IFE - Labs in 3 months =  SPEP, immunofixation, light chains, LDH, beta-2 microglobulin, CBC/D, and CMP - MD visit with DR. KATRAGADDA after labs in 3 months  2.  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 - She is taking Eliquis without any major bleeding events      - PLAN: Continue Eliquis.   3.  Leukocytosis: - Intermittent leukocytosis since at least 2012 - Denies any chronic steroid use. - She is a non-smoker - BCR-ABL FISH is negative.  JAK2 with reflex is negative - Most recent labs (07/09/2022): WBC normal at 9.4 with normal differential - PLAN: Suspect intermittent reactive leukocytosis in the setting of obesity and high burden of chronic disease.  MPN workup negative.   4.  Microcytic anemia, RESOLVED - Previously known to be iron deficient. - Last received IV iron on 06/25/2017 and 07/16/2017. - Not currently on iron supplements. - No bright red blood per rectum or melena - Most recent iron panel (03/10/2022): Ferritin 129, iron saturation 35% - Most recent CBC (07/09/2022) with Hgb 12.5  - PLAN:  Continue iron rich diet.  We will continue intermittent surveillance.   5. CKD stage IIIa - Renal biopsy (08/29/2021) consistent with kidney disease from diabetes and hypertension - Baseline creatinine 1.1-1.4 - PLAN: Continue follow-up with Dr. Wolfgang Phoenix   6.  Elevated liver enzymes: - Ultrasound abdomen on 04/25/2018 showed fatty liver. - She has chronically elevated AST, ALT, and alkaline phosphatase - Liver biopsy (10/15/2020): Minimally active steatohepatitis, mild portal and centrilobular fibrosis - PLAN: Continue follow up with GI (NP Lewie Loron)   PLAN SUMMARY: >> CT CAP >> 24-hour urine/UPEP/UIFE >> Labs in 3 months =  SPEP, immunofixation, light chains, LDH, beta-2 microglobulin, CBC/D, and CMP >> OFFICE visit with DR. KATRAGADDA in 3 months (1 week after labs)     REVIEW OF SYSTEMS:  Review of Systems  Constitutional:  Positive for fatigue. Negative for appetite change, chills, diaphoresis, fever and unexpected weight change.  HENT:   Positive for trouble swallowing. Negative for lump/mass and nosebleeds.   Eyes:  Negative for eye problems.  Respiratory:  Positive for cough and shortness of breath. Negative for hemoptysis.   Cardiovascular:  Positive for chest pain (at times, none today) and palpitations. Negative for leg swelling.  Gastrointestinal:  Positive for constipation (IBS) and diarrhea. Negative for abdominal pain, blood in stool, nausea and vomiting.  Genitourinary:  Negative for hematuria.   Musculoskeletal:  Positive for arthralgias and back pain.  Skin: Negative.   Neurological:  Positive for dizziness and numbness. Negative for headaches and light-headedness.  Hematological:  Does not bruise/bleed easily.  Psychiatric/Behavioral:  Positive for depression and sleep disturbance. The patient is nervous/anxious.      PHYSICAL EXAM:  ECOG PERFORMANCE STATUS: 2 - Symptomatic, <50% confined to bed  There were no vitals filed for this visit. There were no vitals  filed for this visit. Physical Exam Constitutional:      Appearance: Normal appearance. She is obese.     Comments: Presents in wheelchair today  HENT:     Head: Normocephalic and atraumatic.     Mouth/Throat:     Mouth: Mucous membranes are moist.  Eyes:     Extraocular Movements: Extraocular movements intact.     Pupils: Pupils are equal, round, and reactive to light.  Cardiovascular:     Rate and Rhythm: Normal rate and regular rhythm.     Pulses: Normal pulses.     Heart sounds: Normal heart sounds.  Pulmonary:     Effort: Pulmonary effort is normal.     Breath sounds: Decreased air movement present. Decreased breath sounds present.  Abdominal:     General: Bowel sounds are normal.     Palpations: Abdomen is soft.     Tenderness: There is no abdominal tenderness.  Musculoskeletal:        General: No swelling.     Right lower leg: Edema (2+) present.     Left lower leg: Edema (2+) present.  Lymphadenopathy:     Cervical: No cervical adenopathy.  Skin:    General: Skin is warm and dry.  Neurological:     General: No focal deficit present.     Mental Status: She is alert and oriented to person, place, and time.  Psychiatric:        Mood and Affect: Mood normal.        Behavior: Behavior normal.     PAST MEDICAL/SURGICAL HISTORY:  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   Type 2 diabetes mellitus (HCC)    Past Surgical History:  Procedure Laterality Date   ABDOMINAL HYSTERECTOMY  partial   CARPAL  TUNNEL RELEASE Right 1991   CATARACT EXTRACTION W/PHACO Right 05/08/2013   Procedure: CATARACT EXTRACTION PHACO AND INTRAOCULAR LENS PLACEMENT (IOC);  Surgeon: Gemma Payor, MD;  Location: [facility];  Service: Ophthalmology;  Laterality: Right;  CDE 10.31   CATARACT EXTRACTION W/PHACO Left 08/17/2013   Procedure: CATARACT EXTRACTION PHACO AND INTRAOCULAR LENS PLACEMENT (IOC);  Surgeon: Gemma Payor, MD;  Location: [facility];  Service: Ophthalmology;  Laterality: Left;  CDE:9.03   CHOLECYSTECTOMY  1971   COLONOSCOPY WITH PROPOFOL N/A 01/06/2016   Dr. Jena Gauss: diverticulosis    DENTAL SURGERY     ESOPHAGEAL BRUSHING  08/29/2019   Procedure: ESOPHAGEAL BRUSHING;  Surgeon: Corbin Ade, MD;  Location: [facility];  Service: Endoscopy;;   ESOPHAGOGASTRODUODENOSCOPY (EGD) WITH PROPOFOL N/A 01/06/2016   Dr. Jena Gauss: normal s/p empiric dilation    ESOPHAGOGASTRODUODENOSCOPY (EGD) WITH PROPOFOL N/A 08/29/2019  esophageal plaques vs medication residue adherent to tubular esophagus s/p KOH brushing and dilation. Medium-sized hiatal hernia. + for candida. Diflucan.    EYE SURGERY     IR BONE MARROW BIOPSY & ASPIRATION  03/31/2022   MALONEY DILATION N/A 01/06/2016   Procedure: Elease Hashimoto DILATION;  Surgeon: Corbin Ade, MD;  Location: [facility];  Service: Endoscopy;  Laterality: N/A;   MALONEY DILATION N/A 08/29/2019   Procedure: Elease Hashimoto DILATION;  Surgeon: Corbin Ade, MD;  Location: [facility];  Service: Endoscopy;  Laterality: N/A;   REVERSE SHOULDER ARTHROPLASTY Right 08/06/2017   Procedure: RIGHT REVERSE SHOULDER ARTHROPLASTY;  Surgeon: Beverely Low, MD;  Location: [facility] OR;  Service: Orthopedics;  Laterality: Right;   WRIST GANGLION EXCISION Left     SOCIAL HISTORY:  Social History   Socioeconomic History   Marital status: Married    Spouse name: Not on file   Number of children: Not on file   Years of education: Not on file   Highest education level: Not on file  Occupational History   Not on  file  Tobacco Use   Smoking status: Former    Packs/day: 0.25    Years: 30.00    Additional pack years: 0.00    Total pack years: 7.50    Types: Cigarettes    Quit date: 02/17/2011    Years since quitting: 11.4   Smokeless tobacco: Never  Vaping Use   Vaping Use: Never used  Substance and Sexual Activity   Alcohol use: Not Currently    Comment: rare   Drug use: No   Sexual activity: Not Currently    Birth control/protection: Surgical  Other Topics Concern   Not on file  Social History Narrative   Not on file   Social Determinants of Health   Financial Resource Strain: Not on file  Food Insecurity: Not on file  Transportation Needs: Not on file  Physical Activity: Not on file  Stress: Not on file  Social Connections: Not on file  Intimate Partner Violence: Not on file    FAMILY HISTORY:  Family History  Problem Relation Age of Onset   Hypertension Mother    Diabetes Mother    COPD Mother    Arthritis Mother    Diabetes Father    Arthritis Father    Dementia Father    CAD Father    Hypothyroidism Sister    Diabetes Brother    Colon cancer Niece    Colon polyps Neg Hx    Sleep apnea Neg Hx     CURRENT MEDICATIONS:  Outpatient Encounter Medications as of 07/20/2022  Medication Sig Note   Accu-Chek FastClix Lancets MISC Apply topically.    ACCU-CHEK GUIDE test strip 4 (four) times daily.    acetaminophen (TYLENOL) 500 MG tablet Take 1,000 mg by mouth every 6 (six) hours as needed for moderate pain or headache.    albuterol (PROVENTIL) (2.5 MG/3ML) 0.083% nebulizer solution Take 2.5 mg by nebulization every 6 (six) hours as needed for wheezing or shortness of breath.    albuterol (VENTOLIN HFA) 108 (90 Base) MCG/ACT inhaler Inhale 1-2 puffs into the lungs every 6 (six) hours as needed for wheezing or shortness of breath.    Alcohol Swabs (ALCOHOL PADS) 70 % PADS SMARTSIG:Pledget(s) Topical 4 Times Daily    apixaban (ELIQUIS) 5 MG TABS tablet Take 1 tablet (5 mg  total) by mouth 2 (two) times daily.    ARNUITY ELLIPTA 100 MCG/ACT AEPB Inhale 1 puff  into the lungs at bedtime.     Ascorbic Acid (VITAMIN C) 1000 MG tablet Take 500 mg by mouth daily.    atorvastatin (LIPITOR) 80 MG tablet Take 1 tablet (80 mg total) by mouth daily.    bisoprolol (ZEBETA) 10 MG tablet TAKE 1 TABLET(10 MG) BY MOUTH IN THE MORNING AND AT BEDTIME    Blood Glucose Calibration (ACCU-CHEK GUIDE CONTROL) LIQD See admin instructions.    Blood Glucose Monitoring Suppl (ACCU-CHEK GUIDE ME) w/Device KIT 4 (four) times daily.    Cetirizine HCl 10 MG CAPS Take 10 mg by mouth daily.    Cholecalciferol (VITAMIN D) 2000 units tablet Take 4,000 Units by mouth daily.     cimetidine (TAGAMET) 200 MG tablet Take 0.5 tablets (100 mg total) by mouth daily as needed. (Patient taking differently: Take 100 mg by mouth daily as needed (indigestion).)    CINNAMON PO Take 1-2 capsules by mouth 3 (three) times daily.    clobetasol ointment (TEMOVATE) 0.05 % Apply 1 application topically 3 (three) times daily as needed (imflammation).    diphenhydrAMINE (BENADRYL) 25 mg capsule Take 50 mg by mouth every 4 (four) hours as needed for itching.    DULoxetine (CYMBALTA) 60 MG capsule Take 60 mg by mouth 2 (two) times daily.    EASY COMFORT PEN NEEDLES 31G X 5 MM MISC INJECT IN SULIN EVERY DAY AS DIRECTED    empagliflozin (JARDIANCE) 10 MG TABS tablet Take 1 tablet (10 mg total) by mouth daily.    fluticasone (VERAMYST) 27.5 MCG/SPRAY nasal spray Place 2 sprays into the nose daily.    guaiFENesin (ROBITUSSIN) 100 MG/5ML SOLN Take 5 mLs (100 mg total) by mouth every 4 (four) hours as needed for cough or to loosen phlegm.    HUMALOG KWIKPEN 100 UNIT/ML KwikPen Inject 5-11 Units into the skin 3 (three) times daily. Sliding Scale (Patient taking differently: Inject 3 Units into the skin 3 (three) times daily. Sliding Scale) 06/23/2022: Patient is injecting 5-11 units three times daily per sliding scale.   hydrOXYzine  (ATARAX/VISTARIL) 25 MG tablet Take 25 mg by mouth every 8 (eight) hours as needed for itching.     insulin degludec (TRESIBA FLEXTOUCH) 100 UNIT/ML FlexTouch Pen Inject 20 Units into the skin at bedtime.    ipratropium-albuterol (DUONEB) 0.5-2.5 (3) MG/3ML SOLN Take 3 mLs by nebulization every 6 (six) hours as needed (shortness of breath).    Lancet Devices (EASY MINI EJECT LANCING DEVICE) MISC 4 (four) times daily.    levothyroxine (SYNTHROID) 88 MCG tablet Take 88 mcg by mouth daily before breakfast.    lidocaine (LIDODERM) 5 % Place 1 patch onto the skin daily. Remove & Discard patch within 12 hours or as directed by MD    lubiprostone (AMITIZA) 24 MCG capsule Take 1 capsule (24 mcg total) by mouth 2 (two) times daily with a meal.    LYRICA 75 MG capsule Take 75 mg by mouth 2 (two) times daily.     methocarbamol (ROBAXIN) 500 MG tablet Take 1 tablet (500 mg total) by mouth 3 (three) times daily as needed. (Patient taking differently: Take 500 mg by mouth 3 (three) times daily as needed for muscle spasms.)    montelukast (SINGULAIR) 10 MG tablet Take 10 mg by mouth at bedtime.    Omega-3 Fatty Acids (OMEGA 3 PO) Take 1 capsule by mouth daily.    pantoprazole (PROTONIX) 40 MG tablet Take 1 tablet (40 mg total) by mouth daily before breakfast.  potassium chloride (KLOR-CON) 10 MEQ tablet Take 10 mEq by mouth daily.    REPATHA SURECLICK 140 MG/ML SOAJ ADMINISTER 1 ML UNDER THE SKIN EVERY 14 DAYS    REPATHA SURECLICK 140 MG/ML SOAJ Inject 140 mg into the skin every 14 (fourteen) days.    RESTASIS 0.05 % ophthalmic emulsion Place 1 drop into both eyes 2 (two) times daily.    telmisartan (MICARDIS) 20 MG tablet Take 40 mg by mouth daily.    tirzepatide (MOUNJARO) 7.5 MG/0.5ML Pen Inject 7.5 mg into the skin once a week.    torsemide (DEMADEX) 20 MG tablet Take 2 tablets (40 mg total) by mouth daily.    traMADol (ULTRAM) 50 MG tablet Take 50 mg by mouth 3 (three) times daily as needed for moderate  pain.    traZODone (DESYREL) 150 MG tablet Take 150 mg by mouth at bedtime.    vitamin B-12 (CYANOCOBALAMIN) 1000 MCG tablet Take 1,000 mcg by mouth daily.    No facility-administered encounter medications on file as of 07/20/2022.    ALLERGIES:  Allergies  Allergen Reactions   Tetracyclines & Related Anaphylaxis and Rash   Banana Hives and Nausea And Vomiting   Penicillins Rash and Other (See Comments)    Has patient had a PCN reaction causing immediate rash, facial/tongue/throat swelling, SOB or lightheadedness with hypotension: No Has patient had a PCN reaction causing severe rash involving mucus membranes or skin necrosis: No Has patient had a PCN reaction that required hospitalization No Has patient had a PCN reaction occurring within the last 10 years: No If all of the above answers are "NO", then may proceed with Cephalosporin use.     LABORATORY DATA:  I have reviewed the labs as listed.  CBC    Component Value Date/Time   WBC 9.4 07/09/2022 1506   RBC 4.72 07/09/2022 1506   HGB 12.5 07/09/2022 1506   HCT 41.6 07/09/2022 1506   PLT 200 07/09/2022 1506   MCV 88.1 07/09/2022 1506   MCH 26.5 07/09/2022 1506   MCHC 30.0 07/09/2022 1506   RDW 13.9 07/09/2022 1506   LYMPHSABS 2.5 07/09/2022 1506   MONOABS 0.6 07/09/2022 1506   EOSABS 0.4 07/09/2022 1506   BASOSABS 0.0 07/09/2022 1506      Latest Ref Rng & Units 07/09/2022    3:06 PM 05/20/2022    2:36 PM 03/10/2022    2:14 PM  CMP  Glucose 70 - 99 mg/dL 161   096   BUN 8 - 23 mg/dL 35   16   Creatinine 0.45 - 1.00 mg/dL 4.09   8.11   Sodium 914 - 145 mmol/L 134   130   Potassium 3.5 - 5.1 mmol/L 4.6   4.3   Chloride 98 - 111 mmol/L 104   99   CO2 22 - 32 mmol/L 24   23   Calcium 8.9 - 10.3 mg/dL 8.6   8.5   Total Protein 6.5 - 8.1 g/dL 8.9  8.0  8.8   Total Bilirubin 0.3 - 1.2 mg/dL 0.5  0.5  0.5   Alkaline Phos 38 - 126 U/L 339   356   AST 15 - 41 U/L 35  52  54   ALT 0 - 44 U/L 46  62  74     DIAGNOSTIC  IMAGING:  I have independently reviewed the relevant imaging and discussed with the patient.   WRAP UP:  All questions were answered. The patient knows to call the clinic with  any problems, questions or concerns.  Medical decision making: Moderate  Time spent on visit: I spent 20 minutes counseling the patient face to face. The total time spent in the appointment was 30 minutes and more than 50% was on counseling.  Carnella Guadalajara, PA-C  07/20/22 11:40 AM

## 2022-07-20 ENCOUNTER — Inpatient Hospital Stay: Payer: 59 | Attending: Hematology | Admitting: Physician Assistant

## 2022-07-20 VITALS — BP 123/76 | HR 95 | Temp 98.5°F | Resp 20 | Wt 227.1 lb

## 2022-07-20 DIAGNOSIS — Z86718 Personal history of other venous thrombosis and embolism: Secondary | ICD-10-CM | POA: Insufficient documentation

## 2022-07-20 DIAGNOSIS — Z87891 Personal history of nicotine dependence: Secondary | ICD-10-CM | POA: Diagnosis not present

## 2022-07-20 DIAGNOSIS — D472 Monoclonal gammopathy: Secondary | ICD-10-CM | POA: Diagnosis not present

## 2022-07-20 DIAGNOSIS — C8300 Small cell B-cell lymphoma, unspecified site: Secondary | ICD-10-CM | POA: Diagnosis not present

## 2022-07-20 NOTE — Patient Instructions (Signed)
[facility] **VISIT SUMMARY & IMPORTANT INSTRUCTIONS **   You were seen today by Rojelio Brenner PA-C to discuss the results of your recent bone marrow biopsy.    SMOLDERING MYELOMA Your labs are stable in the "gray area" in between MGUS and multiple myeloma, something that we call "smoldering myeloma."   You do not need any treatment for smoldering myeloma, but since it has a higher risk of progressing to myeloma cancer, we will monitor you more closely  With labs and an office visit every 3 months.  LYMPHOPLASMACYTIC LYMPHOMA Your bone marrow biopsy and other tests also show evidence of a type of slow-growing lymphoma in your bone marrow. This is similar to your smoldering myeloma, but appears to be a different condition.  (This can be somewhat difficult to determine however, due to overlapping features of both diseases.) At this time, you do not need treatment for this lymphoma, but we will continue to monitor closely. Please let us know if you develop any unexplained fevers, shaking chills, drenching night sweats, or unintentional weight loss. We will check a CT scan of your chest, abdomen, and pelvis to see if you have any enlarged lymph nodes, enlarged spleen, or enlarged liver.  LABS: Return in 3 months for repeat labs  OTHER TESTS: CT scan chest, abdomen, pelvis  FOLLOW-UP APPOINTMENT: Office visit with DR. KATRAGADDA in 3 months (1 week after labs)  ** Thank you for trusting me with your healthcare!  I strive to provide all of my patients with quality care at each visit.  If you receive a survey for this visit, I would be so grateful to you for taking the time to provide feedback.  Thank you in advance!  ~ Jamisyn Langer                   Dr. Doreatha Massed   &   Rojelio Brenner, PA-C   - - - - - - - - - - - - - - - - - -    Thank you for choosing  [facility] at [facility] to provide your oncology and hematology  care.  To afford each patient quality time with our provider, please arrive at least 15 minutes before your scheduled appointment time.   If you have a lab appointment with the Cancer Center please come in thru the Main Entrance and check in at the main information desk.  You need to re-schedule your appointment should you arrive 10 or more minutes late.  We strive to give you quality time with our providers, and arriving late affects you and other patients whose appointments are after yours.  Also, if you no show three or more times for appointments you may be dismissed from the clinic at the providers discretion.     Again, thank you for choosing [facility].  Our hope is that these requests will decrease the amount of time that you wait before being seen by our physicians.       _____________________________________________________________  Should you have questions after your visit to [facility], please contact our office at [phone] and follow the prompts.  Our office hours are 8:00 a.m. and 4:30 p.m. Monday - Friday.  Please note that voicemails left after 4:00 p.m. may not be returned until the following business day.  We are closed weekends and major holidays.  You do have access to a nurse 24-7, just call  the main number to the clinic [phone] and do not press any options, hold on the line and a nurse will answer the phone.    For prescription refill requests, have your pharmacy contact our office and allow 72 hours.

## 2022-07-21 LAB — IMMUNOFIXATION ELECTROPHORESIS
IgA: 146 mg/dL (ref 64–422)
IgG (Immunoglobin G), Serum: 3189 mg/dL — ABNORMAL HIGH (ref 586–1602)
IgM (Immunoglobulin M), Srm: 40 mg/dL (ref 26–217)
Total Protein ELP: 8.3 g/dL (ref 6.0–8.5)

## 2022-07-22 ENCOUNTER — Ambulatory Visit: Payer: 59 | Admitting: Neurology

## 2022-07-22 DIAGNOSIS — R351 Nocturia: Secondary | ICD-10-CM

## 2022-07-22 DIAGNOSIS — G4719 Other hypersomnia: Secondary | ICD-10-CM

## 2022-07-22 DIAGNOSIS — Z8673 Personal history of transient ischemic attack (TIA), and cerebral infarction without residual deficits: Secondary | ICD-10-CM

## 2022-07-22 DIAGNOSIS — E669 Obesity, unspecified: Secondary | ICD-10-CM

## 2022-07-22 DIAGNOSIS — E66811 Obesity, class 1: Secondary | ICD-10-CM

## 2022-07-22 DIAGNOSIS — Z8669 Personal history of other diseases of the nervous system and sense organs: Secondary | ICD-10-CM

## 2022-08-12 ENCOUNTER — Other Ambulatory Visit (HOSPITAL_COMMUNITY): Payer: Self-pay | Admitting: Nurse Practitioner

## 2022-08-12 DIAGNOSIS — K7581 Nonalcoholic steatohepatitis (NASH): Secondary | ICD-10-CM

## 2022-08-12 DIAGNOSIS — R7989 Other specified abnormal findings of blood chemistry: Secondary | ICD-10-CM

## 2022-08-12 DIAGNOSIS — R748 Abnormal levels of other serum enzymes: Secondary | ICD-10-CM

## 2022-08-16 DIAGNOSIS — K7402 Hepatic fibrosis, advanced fibrosis: Secondary | ICD-10-CM | POA: Insufficient documentation

## 2022-08-16 DIAGNOSIS — R188 Other ascites: Secondary | ICD-10-CM | POA: Insufficient documentation

## 2022-08-18 ENCOUNTER — Ambulatory Visit (HOSPITAL_COMMUNITY): Payer: 59

## 2022-08-29 ENCOUNTER — Telehealth: Payer: Self-pay | Admitting: Internal Medicine

## 2022-08-29 NOTE — Telephone Encounter (Signed)
Briefly reviewed notes in media from [facility] regarding recent liver evaluation.  Working Oceanographer.  F3 fibrosis.  Elevated alkaline phosphatase for which MRCP and additional labs have been ordered there.  Recommendations included hepatoma screening, continue Urso, periodic noninvasive fibrosis assessment, consider pharmacotherapy for MASH depending on clinical course.

## 2022-08-31 NOTE — Telephone Encounter (Signed)
 Noted  

## 2022-09-08 ENCOUNTER — Ambulatory Visit (HOSPITAL_COMMUNITY)
Admission: RE | Admit: 2022-09-08 | Discharge: 2022-09-08 | Disposition: A | Discharge status: Home / Self Care | Payer: 59 | Source: Ambulatory Visit | Attending: Nurse Practitioner | Admitting: Nurse Practitioner

## 2022-09-08 ENCOUNTER — Other Ambulatory Visit (HOSPITAL_COMMUNITY): Payer: Self-pay | Admitting: Nurse Practitioner

## 2022-09-08 DIAGNOSIS — K7581 Nonalcoholic steatohepatitis (NASH): Secondary | ICD-10-CM | POA: Insufficient documentation

## 2022-09-08 DIAGNOSIS — R748 Abnormal levels of other serum enzymes: Secondary | ICD-10-CM | POA: Diagnosis not present

## 2022-09-08 DIAGNOSIS — R52 Pain, unspecified: Secondary | ICD-10-CM | POA: Diagnosis present

## 2022-09-08 DIAGNOSIS — R7989 Other specified abnormal findings of blood chemistry: Secondary | ICD-10-CM | POA: Diagnosis present

## 2022-09-08 MED ORDER — GADOBUTROL 1 MMOL/ML IV SOLN
10.0000 mL | Freq: Once | INTRAVENOUS | Status: AC | PRN
  Administered 2022-09-08: 10 mL via INTRAVENOUS

## 2022-09-15 ENCOUNTER — Ambulatory Visit (HOSPITAL_COMMUNITY): Payer: 59

## 2022-09-15 NOTE — Telephone Encounter (Signed)
Patient left a voicemail about rescheduling her HST.

## 2022-09-17 ENCOUNTER — Other Ambulatory Visit (HOSPITAL_COMMUNITY)
Admission: RE | Admit: 2022-09-17 | Discharge: 2022-09-17 | Disposition: A | Discharge status: Home / Self Care | Payer: 59 | Source: Ambulatory Visit | Attending: Nurse Practitioner | Admitting: Nurse Practitioner

## 2022-09-17 DIAGNOSIS — E039 Hypothyroidism, unspecified: Secondary | ICD-10-CM | POA: Insufficient documentation

## 2022-09-17 DIAGNOSIS — N1832 Chronic kidney disease, stage 3b: Secondary | ICD-10-CM | POA: Diagnosis not present

## 2022-09-17 DIAGNOSIS — Z794 Long term (current) use of insulin: Secondary | ICD-10-CM | POA: Insufficient documentation

## 2022-09-17 DIAGNOSIS — E1122 Type 2 diabetes mellitus with diabetic chronic kidney disease: Secondary | ICD-10-CM | POA: Insufficient documentation

## 2022-09-17 LAB — COMPREHENSIVE METABOLIC PANEL
ALT: 31 U/L (ref 0–44)
AST: 31 U/L (ref 15–41)
Albumin: 2.9 g/dL — ABNORMAL LOW (ref 3.5–5.0)
Alkaline Phosphatase: 219 U/L — ABNORMAL HIGH (ref 38–126)
Anion gap: 7 (ref 5–15)
BUN: 21 mg/dL (ref 8–23)
CO2: 22 mmol/L (ref 22–32)
Calcium: 8.6 mg/dL — ABNORMAL LOW (ref 8.9–10.3)
Chloride: 106 mmol/L (ref 98–111)
Creatinine, Ser: 1.32 mg/dL — ABNORMAL HIGH (ref 0.44–1.00)
GFR, Estimated: 43 mL/min — ABNORMAL LOW (ref >60)
Glucose, Bld: 127 mg/dL — ABNORMAL HIGH (ref 70–99)
Potassium: 3.8 mmol/L (ref 3.5–5.1)
Sodium: 135 mmol/L (ref 135–145)
Total Bilirubin: 0.4 mg/dL (ref 0.3–1.2)
Total Protein: 8.2 g/dL — ABNORMAL HIGH (ref 6.5–8.1)

## 2022-09-17 LAB — T4, FREE: Free T4: 0.87 ng/dL (ref 0.61–1.12)

## 2022-09-17 LAB — TSH: TSH: 4.374 u[IU]/mL (ref 0.350–4.500)

## 2022-09-21 ENCOUNTER — Ambulatory Visit (HOSPITAL_COMMUNITY)
Admission: RE | Admit: 2022-09-21 | Discharge: 2022-09-21 | Disposition: A | Discharge status: Home / Self Care | Payer: 59 | Source: Ambulatory Visit | Attending: Physician Assistant | Admitting: Physician Assistant

## 2022-09-21 DIAGNOSIS — C8300 Small cell B-cell lymphoma, unspecified site: Secondary | ICD-10-CM | POA: Diagnosis present

## 2022-09-21 MED ORDER — IOHEXOL 9 MG/ML PO SOLN
ORAL | Status: AC
  Filled 2022-09-21: qty 500

## 2022-09-21 MED ORDER — IOHEXOL 300 MG/ML  SOLN
75.0000 mL | Freq: Once | INTRAMUSCULAR | Status: AC | PRN
  Administered 2022-09-21: 75 mL via INTRAVENOUS

## 2022-09-24 ENCOUNTER — Ambulatory Visit: Payer: 59 | Admitting: Nurse Practitioner

## 2022-09-24 DIAGNOSIS — E782 Mixed hyperlipidemia: Secondary | ICD-10-CM

## 2022-09-24 DIAGNOSIS — Z7984 Long term (current) use of oral hypoglycemic drugs: Secondary | ICD-10-CM

## 2022-09-24 DIAGNOSIS — Z794 Long term (current) use of insulin: Secondary | ICD-10-CM

## 2022-09-24 DIAGNOSIS — E039 Hypothyroidism, unspecified: Secondary | ICD-10-CM

## 2022-09-24 DIAGNOSIS — I10 Essential (primary) hypertension: Secondary | ICD-10-CM

## 2022-09-24 DIAGNOSIS — Z7985 Long-term (current) use of injectable non-insulin antidiabetic drugs: Secondary | ICD-10-CM

## 2022-09-29 ENCOUNTER — Ambulatory Visit: Payer: 59 | Admitting: Neurology

## 2022-09-29 ENCOUNTER — Encounter: Payer: Self-pay | Admitting: Nurse Practitioner

## 2022-09-29 ENCOUNTER — Ambulatory Visit (INDEPENDENT_AMBULATORY_CARE_PROVIDER_SITE_OTHER): Payer: 59 | Admitting: Nurse Practitioner

## 2022-09-29 VITALS — BP 96/62 | HR 123 | Ht 69.0 in | Wt 226.0 lb

## 2022-09-29 DIAGNOSIS — E782 Mixed hyperlipidemia: Secondary | ICD-10-CM

## 2022-09-29 DIAGNOSIS — R351 Nocturia: Secondary | ICD-10-CM

## 2022-09-29 DIAGNOSIS — Z8669 Personal history of other diseases of the nervous system and sense organs: Secondary | ICD-10-CM

## 2022-09-29 DIAGNOSIS — G4733 Obstructive sleep apnea (adult) (pediatric): Secondary | ICD-10-CM

## 2022-09-29 DIAGNOSIS — Z794 Long term (current) use of insulin: Secondary | ICD-10-CM

## 2022-09-29 DIAGNOSIS — E1122 Type 2 diabetes mellitus with diabetic chronic kidney disease: Secondary | ICD-10-CM | POA: Diagnosis not present

## 2022-09-29 DIAGNOSIS — Z8673 Personal history of transient ischemic attack (TIA), and cerebral infarction without residual deficits: Secondary | ICD-10-CM

## 2022-09-29 DIAGNOSIS — Z7985 Long-term (current) use of injectable non-insulin antidiabetic drugs: Secondary | ICD-10-CM

## 2022-09-29 DIAGNOSIS — I10 Essential (primary) hypertension: Secondary | ICD-10-CM | POA: Diagnosis not present

## 2022-09-29 DIAGNOSIS — Z7984 Long term (current) use of oral hypoglycemic drugs: Secondary | ICD-10-CM

## 2022-09-29 DIAGNOSIS — G4719 Other hypersomnia: Secondary | ICD-10-CM

## 2022-09-29 DIAGNOSIS — E669 Obesity, unspecified: Secondary | ICD-10-CM

## 2022-09-29 DIAGNOSIS — N1832 Chronic kidney disease, stage 3b: Secondary | ICD-10-CM | POA: Diagnosis not present

## 2022-09-29 DIAGNOSIS — E039 Hypothyroidism, unspecified: Secondary | ICD-10-CM

## 2022-09-29 LAB — POCT GLYCOSYLATED HEMOGLOBIN (HGB A1C): Hemoglobin A1C: 8.7 % — AB (ref 4.0–5.6)

## 2022-09-29 MED ORDER — TIRZEPATIDE 10 MG/0.5ML ~~LOC~~ SOAJ: 10.0000 mg | SUBCUTANEOUS | 1 refills | Status: DC

## 2022-09-29 MED ORDER — LEVOTHYROXINE SODIUM 100 MCG PO TABS: 100.0000 ug | ORAL_TABLET | Freq: Every day | ORAL | 1 refills | Status: DC

## 2022-09-29 MED ORDER — INSULIN LISPRO (1 UNIT DIAL) 100 UNIT/ML (KWIKPEN): 5.0000 [IU] | PEN_INJECTOR | Freq: Three times a day (TID) | SUBCUTANEOUS | 3 refills | Status: DC

## 2022-09-29 MED ORDER — TRESIBA FLEXTOUCH 100 UNIT/ML ~~LOC~~ SOPN: 20.0000 [IU] | PEN_INJECTOR | Freq: Every day | SUBCUTANEOUS | 3 refills | Status: DC

## 2022-09-29 NOTE — Progress Notes (Signed)
Endocrinology Follow Up Note       09/29/2022, 9:37 AM   Subjective:    [name], female    DOB: 12/03/1950.  Stephanie [name] is being seen in follow up after being seen in consultation for management of currently uncontrolled symptomatic diabetes requested by  Marylynn Pearson, FNP.   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   Type 2 diabetes mellitus (HCC)     Past Surgical History:  Procedure Laterality Date   ABDOMINAL HYSTERECTOMY  partial   CARPAL TUNNEL RELEASE Right 1991   CATARACT EXTRACTION W/PHACO Right 05/08/2013   Procedure: CATARACT EXTRACTION PHACO AND INTRAOCULAR LENS PLACEMENT (IOC);  Surgeon: Gemma Payor, MD;  Location: [facility];  Service: Ophthalmology;  Laterality: Right;  CDE 10.31   CATARACT EXTRACTION W/PHACO Left 08/17/2013   Procedure: CATARACT EXTRACTION PHACO AND INTRAOCULAR LENS PLACEMENT (IOC);  Surgeon: Gemma Payor, MD;  Location: [facility];  Service: Ophthalmology;  Laterality: Left;  CDE:9.03   CHOLECYSTECTOMY  1971   COLONOSCOPY WITH PROPOFOL N/A 01/06/2016   Dr. Jena Gauss: diverticulosis    DENTAL SURGERY     ESOPHAGEAL BRUSHING  08/29/2019   Procedure: ESOPHAGEAL BRUSHING;  Surgeon: Corbin Ade, MD;  Location: [facility];  Service: Endoscopy;;   ESOPHAGOGASTRODUODENOSCOPY (EGD) WITH PROPOFOL N/A 01/06/2016   Dr. Jena Gauss: normal s/p empiric dilation    ESOPHAGOGASTRODUODENOSCOPY (EGD) WITH PROPOFOL N/A 08/29/2019    esophageal plaques vs medication residue adherent to tubular esophagus s/p KOH brushing and dilation. Medium-sized hiatal hernia. + for candida. Diflucan.    EYE SURGERY     IR BONE MARROW BIOPSY & ASPIRATION  03/31/2022   MALONEY DILATION N/A 01/06/2016   Procedure: Elease Hashimoto DILATION;  Surgeon: Corbin Ade, MD;  Location: [facility];  Service: Endoscopy;  Laterality: N/A;   MALONEY DILATION N/A 08/29/2019   Procedure: Elease Hashimoto DILATION;  Surgeon: Corbin Ade, MD;  Location: [facility];  Service: Endoscopy;  Laterality: N/A;   REVERSE SHOULDER ARTHROPLASTY Right 08/06/2017   Procedure: RIGHT REVERSE SHOULDER ARTHROPLASTY;  Surgeon: Beverely Low, MD;  Location: [facility];  Service: Orthopedics;  Laterality: Right;   WRIST GANGLION EXCISION Left     Social History   Socioeconomic History   Marital status: Married    Spouse name: Not on file   Number of children: Not on file   Years of education: Not on file   Highest education level: Not on file  Occupational History   Not on file  Tobacco Use   Smoking status: Former    Current packs/day: 0.00    Average packs/day: 0.3 packs/day for 30.0 years (  7.5 ttl pk-yrs)    Types: Cigarettes    Start date: 02/16/1981    Quit date: 02/17/2011    Years since quitting: 11.6   Smokeless tobacco: Never  Vaping Use   Vaping status: Never Used  Substance and Sexual Activity   Alcohol use: Not Currently    Comment: rare   Drug use: No   Sexual activity: Not Currently    Birth control/protection: Surgical  Other Topics Concern   Not on file  Social History Narrative   Not on file   Social Determinants of Health   Financial Resource Strain: Not on file  Food Insecurity: Low Risk  (08/12/2022)   [facility]   Food vital sign    Within the past 12 months, you worried that your food would run out before you got money to buy more: Never true    Within the past 12 months, the food you bought just didn't last and  you didn't have money to get more. : Never true  Transportation Needs: No Transportation Needs (08/12/2022)   [facility]   Transportation    In the past 12 months, has lack of reliable transportation kept you from medical appointments, meetings, work or from getting things needed for daily living? : No  Physical Activity: Not on file  Stress: Not on file  Social Connections: Not on file    Family History  Problem Relation Age of Onset   Hypertension Mother    Diabetes Mother    COPD Mother    Arthritis Mother    Diabetes Father    Arthritis Father    Dementia Father    CAD Father    Hypothyroidism Sister    Diabetes Brother    Colon cancer Niece    Colon polyps Neg Hx    Sleep apnea Neg Hx     Outpatient Encounter Medications as of 09/29/2022  Medication Sig   Accu-Chek FastClix Lancets MISC Apply topically.   ACCU-CHEK GUIDE test strip 4 (four) times daily.   acetaminophen (TYLENOL) 500 MG tablet Take 1,000 mg by mouth every 6 (six) hours as needed for moderate pain or headache.   albuterol (PROVENTIL) (2.5 MG/3ML) 0.083% nebulizer solution Take 2.5 mg by nebulization every 6 (six) hours as needed for wheezing or shortness of breath.   albuterol (VENTOLIN HFA) 108 (90 Base) MCG/ACT inhaler Inhale 1-2 puffs into the lungs every 6 (six) hours as needed for wheezing or shortness of breath.   Alcohol Swabs (ALCOHOL PADS) 70 % PADS SMARTSIG:Pledget(s) Topical 4 Times Daily   apixaban (ELIQUIS) 5 MG TABS tablet Take 1 tablet (5 mg total) by mouth 2 (two) times daily.   Ascorbic Acid (VITAMIN C) 1000 MG tablet Take 500 mg by mouth daily.   atorvastatin (LIPITOR) 80 MG tablet Take 1 tablet (80 mg total) by mouth daily.   bisoprolol (ZEBETA) 10 MG tablet TAKE 1 TABLET(10 MG) BY MOUTH IN THE MORNING AND AT BEDTIME   Blood Glucose Calibration (ACCU-CHEK GUIDE CONTROL) LIQD See admin instructions.   Blood Glucose Monitoring Suppl (ACCU-CHEK GUIDE ME)  w/Device KIT 4 (four) times daily.   Cetirizine HCl 10 MG CAPS Take 10 mg by mouth daily.   Cholecalciferol (VITAMIN D) 2000 units tablet Take 4,000 Units by mouth daily.    cimetidine (TAGAMET) 200 MG tablet Take 0.5 tablets (100 mg total) by mouth daily as needed. (Patient taking differently: Take 100 mg by mouth daily as needed (  indigestion).)   CINNAMON PO Take 1-2 capsules by mouth 3 (three) times daily.   clobetasol ointment (TEMOVATE) 0.05 % Apply 1 application topically 3 (three) times daily as needed (imflammation).   diphenhydrAMINE (BENADRYL) 25 mg capsule Take 50 mg by mouth every 4 (four) hours as needed for itching.   DULoxetine (CYMBALTA) 60 MG capsule Take 60 mg by mouth 2 (two) times daily.   EASY COMFORT PEN NEEDLES 31G X 5 MM MISC INJECT IN SULIN EVERY DAY AS DIRECTED   empagliflozin (JARDIANCE) 10 MG TABS tablet Take 1 tablet (10 mg total) by mouth daily.   fluticasone (VERAMYST) 27.5 MCG/SPRAY nasal spray Place 2 sprays into the nose daily.   guaiFENesin (ROBITUSSIN) 100 MG/5ML SOLN Take 5 mLs (100 mg total) by mouth every 4 (four) hours as needed for cough or to loosen phlegm.   hydrOXYzine (ATARAX/VISTARIL) 25 MG tablet Take 25 mg by mouth every 8 (eight) hours as needed for itching.    ipratropium-albuterol (DUONEB) 0.5-2.5 (3) MG/3ML SOLN Take 3 mLs by nebulization every 6 (six) hours as needed (shortness of breath).   Lancet Devices (EASY MINI EJECT LANCING DEVICE) MISC 4 (four) times daily.   lidocaine (LIDODERM) 5 % Place 1 patch onto the skin daily. Remove & Discard patch within 12 hours or as directed by MD   lubiprostone (AMITIZA) 24 MCG capsule Take 1 capsule (24 mcg total) by mouth 2 (two) times daily with a meal.   LYRICA 75 MG capsule Take 75 mg by mouth 2 (two) times daily.    methocarbamol (ROBAXIN) 500 MG tablet Take 1 tablet (500 mg total) by mouth 3 (three) times daily as needed. (Patient taking differently: Take 500 mg by mouth 3 (three) times daily as  needed for muscle spasms.)   montelukast (SINGULAIR) 10 MG tablet Take 10 mg by mouth at bedtime.   pantoprazole (PROTONIX) 40 MG tablet Take 1 tablet (40 mg total) by mouth daily before breakfast.   REPATHA SURECLICK 140 MG/ML SOAJ ADMINISTER 1 ML UNDER THE SKIN EVERY 14 DAYS   REPATHA SURECLICK 140 MG/ML SOAJ Inject 140 mg into the skin every 14 (fourteen) days.   RESTASIS 0.05 % ophthalmic emulsion Place 1 drop into both eyes 2 (two) times daily.   telmisartan (MICARDIS) 20 MG tablet Take 40 mg by mouth daily.   tirzepatide (MOUNJARO) 10 MG/0.5ML Pen Inject 10 mg into the skin once a week.   torsemide (DEMADEX) 20 MG tablet Take 2 tablets (40 mg total) by mouth daily.   traMADol (ULTRAM) 50 MG tablet Take 50 mg by mouth 3 (three) times daily as needed for moderate pain.   traZODone (DESYREL) 150 MG tablet Take 150 mg by mouth at bedtime.   ursodiol (ACTIGALL) 300 MG capsule Take 300 mg by mouth 2 (two) times daily.   vitamin B-12 (CYANOCOBALAMIN) 1000 MCG tablet Take 1,000 mcg by mouth daily.   [DISCONTINUED] HUMALOG KWIKPEN 100 UNIT/ML KwikPen Inject 5-11 Units into the skin 3 (three) times daily. Sliding Scale (Patient taking differently: Inject 3 Units into the skin 3 (three) times daily. Sliding Scale)   [DISCONTINUED] insulin degludec (TRESIBA FLEXTOUCH) 100 UNIT/ML FlexTouch Pen Inject 20 Units into the skin at bedtime.   [DISCONTINUED] levothyroxine (SYNTHROID) 88 MCG tablet Take 88 mcg by mouth daily before breakfast.   [DISCONTINUED] tirzepatide (MOUNJARO) 7.5 MG/0.5ML Pen Inject 7.5 mg into the skin once a week.   insulin degludec (TRESIBA FLEXTOUCH) 100 UNIT/ML FlexTouch Pen Inject 20 Units into the skin at  bedtime.   insulin lispro (HUMALOG KWIKPEN) 100 UNIT/ML KwikPen Inject 5-11 Units into the skin 3 (three) times daily. Sliding Scale   levothyroxine (SYNTHROID) 100 MCG tablet Take 1 tablet (100 mcg total) by mouth daily before breakfast.   No facility-administered encounter  medications on file as of 09/29/2022.    ALLERGIES: Allergies  Allergen Reactions   Tetracyclines & Related Anaphylaxis and Rash   Banana Hives and Nausea And Vomiting   Penicillins Rash and Other (See Comments)    Has patient had a PCN reaction causing immediate rash, facial/tongue/throat swelling, SOB or lightheadedness with hypotension: No Has patient had a PCN reaction causing severe rash involving mucus membranes or skin necrosis: No Has patient had a PCN reaction that required hospitalization No Has patient had a PCN reaction occurring within the last 10 years: No If all of the above answers are "NO", then may proceed with Cephalosporin use.     VACCINATION STATUS: Immunization History  Administered Date(s) Administered   Fluad Quad(high Dose 65+) 11/08/2018, 12/12/2020   Influenza,inj,Quad PF,6+ Mos 12/24/2015   Influenza-Unspecified 12/02/2016   Pneumococcal Polysaccharide-23 10/22/2013    Diabetes She presents for her follow-up diabetic visit. She has type 2 diabetes mellitus. Onset time: Diagnosed at approx age of 73. Her disease course has been fluctuating. There are no hypoglycemic associated symptoms. Associated symptoms include fatigue and polyuria (on Jardiance). Pertinent negatives for diabetes include no polydipsia. There are no hypoglycemic complications. Symptoms are stable. Diabetic complications include a CVA, heart disease and nephropathy. Risk factors for coronary artery disease include diabetes mellitus, dyslipidemia, family history, hypertension, post-menopausal and sedentary lifestyle. Current diabetic treatment includes intensive insulin program and oral agent (monotherapy) (and Mounjaro). She is compliant with treatment most of the time. Her weight is fluctuating minimally. She is following a generally healthy diet. When asked about meal planning, she reported none. She has not had a previous visit with a dietitian. She rarely participates in exercise. Her home  blood glucose trend is fluctuating dramatically. Her overall blood glucose range is >200 mg/dl. (She presents today with her CGM and meter showing above target glycemic profile overall.  Her POCT A1c today is 8.7%, increasing slightly from last visit of 8.2%.  Analysis of her CGM shows TIR 34%, TAR 66%, TBR 0% with a GMI of 8.2%.  She notes she has been particularly stressed lately.  She denies any significant hypoglycemia, did get woken up by her CGM last night for sudden drop but was able to correct before got dangerously low.) An ACE inhibitor/angiotensin II receptor blocker is not being taken. She sees a podiatrist.Eye exam is current.     Review of systems  Constitutional: + minimally fluctuating body weight, current Body mass index is 33.37 kg/m., no fatigue, no subjective hyperthermia, no subjective hypothermia Eyes: no blurry vision, no xerophthalmia ENT: no sore throat, no nodules palpated in throat, no dysphagia/odynophagia, no hoarseness Cardiovascular: no chest pain, no shortness of breath, no palpitations, no leg swelling Respiratory: no cough, no shortness of breath Gastrointestinal: no nausea/vomiting/diarrhea Musculoskeletal: no muscle/joint aches, walks with cane Skin: no rashes, no hyperemia Neurological: no tremors, no numbness, no tingling, no dizziness Psychiatric: no depression, no anxiety  Objective:     BP 96/62 (BP Location: Left Arm, Patient Position: Sitting, Cuff Size: Large)   Pulse (!) 123   Ht 5\' 9"  (1.753 m)   Wt 226 lb (102.5 kg)   BMI 33.37 kg/m   Wt Readings from Last 3 Encounters:  09/29/22 226  lb (102.5 kg)  07/20/22 227 lb 1.2 oz (103 kg)  06/23/22 229 lb 12.8 oz (104.2 kg)     BP Readings from Last 3 Encounters:  09/29/22 96/62  07/20/22 123/76  06/23/22 136/78     Physical Exam- Limited  Constitutional:  Body mass index is 33.37 kg/m. , not in acute distress, normal state of mind Eyes:  EOMI, no exophthalmos Musculoskeletal: no gross  deformities, strength intact in all four extremities, no gross restriction of joint movements, walks with cane Skin:  no rashes, no hyperemia Neurological: no tremor with outstretched hands   Diabetic Foot Exam - Simple   Simple Foot Form Diabetic Foot exam was performed with the following findings: Yes 09/29/2022  9:30 AM  Visual Inspection See comments: Yes Sensation Testing Intact to touch and monofilament testing bilaterally: Yes Pulse Check Posterior Tibialis and Dorsalis pulse intact bilaterally: Yes Comments Callus to tip of right great toe; mild onychomycosis bilaterally      CMP ( most recent) CMP     Component Value Date/Time   NA 135 09/17/2022 1211   K 3.8 09/17/2022 1211   CL 106 09/17/2022 1211   CO2 22 09/17/2022 1211   GLUCOSE 127 (H) 09/17/2022 1211   BUN 21 09/17/2022 1211   CREATININE 1.32 (H) 09/17/2022 1211   CALCIUM 8.6 (L) 09/17/2022 1211   CALCIUM 8.9 08/04/2017 1032   PROT 8.2 (H) 09/17/2022 1211   PROT 8.1 12/16/2015 1625   ALBUMIN 2.9 (L) 09/17/2022 1211   ALBUMIN 3.7 12/16/2015 1625   AST 31 09/17/2022 1211   ALT 31 09/17/2022 1211   ALKPHOS 219 (H) 09/17/2022 1211   BILITOT 0.4 09/17/2022 1211   BILITOT 0.2 12/16/2015 1625   GFRNONAA 43 (L) 09/17/2022 1211   GFRAA 54 (L) 09/05/2019 1021     Diabetic Labs (most recent): Lab Results  Component Value Date   HGBA1C 8.7 (A) 09/29/2022   HGBA1C 8.2 (A) 06/23/2022   HGBA1C 8.8 (H) 01/29/2022   MICROALBUR 5,373.1 (H) 04/03/2021   MICROALBUR 598.9 (H) 12/04/2019     Lipid Panel ( most recent) Lipid Panel     Component Value Date/Time   CHOL 150 01/29/2022 1231   CHOL 210 (H) 09/09/2021 1141   TRIG 130 01/29/2022 1231   HDL 72 01/29/2022 1231   HDL 63 09/09/2021 1141   CHOLHDL 2.1 01/29/2022 1231   VLDL 26 01/29/2022 1231   LDLCALC 52 01/29/2022 1231   LDLCALC 125 (H) 09/09/2021 1141   LABVLDL 22 09/09/2021 1141      Lab Results  Component Value Date   TSH 4.374 09/17/2022    TSH 1.934 01/29/2022   TSH 1.824 10/16/2021   TSH 2.213 07/24/2021   TSH 3.483 04/03/2021   TSH 2.253 11/28/2020   TSH 0.660 09/30/2020   TSH 0.559 06/28/2020   TSH 2.715 12/04/2019   TSH 1.267 06/05/2019   FREET4 0.87 09/17/2022           Assessment & Plan:   1) Type 2 diabetes mellitus with stage 3a chronic kidney disease, with long-term current use of insulin (HCC)  She presents today with her CGM and meter showing above target glycemic profile overall.  Her POCT A1c today is 8.7%, increasing slightly from last visit of 8.2%.  Analysis of her CGM shows TIR 34%, TAR 66%, TBR 0% with a GMI of 8.2%.  She notes she has been particularly stressed lately.  She denies any significant hypoglycemia, did get woken up by her CGM  last night for sudden drop but was able to correct before got dangerously low.  Taylie Dalesio Venturino has currently uncontrolled symptomatic type 2 DM since 72 years of age.   -Recent labs reviewed.  - I had a long discussion with her about the progressive nature of diabetes and the pathology behind its complications. -her diabetes is complicated by CHF, CKD stage 3, CVA x 5 and she remains at a high risk for more acute and chronic complications which include CAD, CVA, CKD, retinopathy, and neuropathy. These are all discussed in detail with her.  The following Lifestyle Medicine recommendations according to American College of Lifestyle Medicine [facility]) were discussed and offered to patient and she agrees to start the journey:  A. Whole Foods, Plant-based plate comprising of fruits and vegetables, plant-based proteins, whole-grain carbohydrates was discussed in detail with the patient.   A list for source of those nutrients were also provided to the patient.  Patient will use only water or unsweetened tea for hydration. B.  The need to stay away from risky substances including alcohol, smoking; obtaining 7 to 9 hours of restorative sleep, at least 150 minutes of moderate  intensity exercise weekly, the importance of healthy social connections,  and stress reduction techniques were discussed. C.  A full color page of  Calorie density of various food groups per pound showing examples of each food groups was provided to the patient.  - Nutritional counseling repeated at each appointment due to patients tendency to fall back in to old habits.  - The patient admits there is a room for improvement in their diet and drink choices. -  Suggestion is made for the patient to avoid simple carbohydrates from their diet including Cakes, Sweet Desserts / Pastries, Ice Cream, Soda (diet and regular), Sweet Tea, Candies, Chips, Cookies, Sweet Pastries, Store Bought Juices, Alcohol in Excess of 1-2 drinks a day, Artificial Sweeteners, Coffee Creamer, and "Sugar-free" Products. This will help patient to have stable blood glucose profile and potentially avoid unintended weight gain.   - I encouraged the patient to switch to unprocessed or minimally processed complex starch and increased protein intake (animal or plant source), fruits, and vegetables.   - Patient is advised to stick to a routine mealtimes to eat 3 meals a day and avoid unnecessary snacks (to snack only to correct hypoglycemia).  - I have approached her with the following individualized plan to manage her diabetes and patient agrees:   -She is advised to continue her Tresiba 20 units SQ nightly, Humalog 5-11 units TID with meals (admits she has missed few doses of this) if glucose is above 90 and she is eating (Specific instructions on how to titrate insulin dosage based on glucose readings given to patient in writing), and continue Jardiance 10 mg po daily.  Will increase her Mounjaro to 10 mg SQ weekly (once she has finished out her current 7.5 mg supply).  The ultimate goal would be to further simplify her regimen and get her off insulin in the future by increasing her GLP1.   -she is encouraged to continue monitoring  glucose 4 times daily (using her CGM), before meals and before bed, and to call the clinic if she has readings less than 70 or above 300 for 3 tests in a row.  - she is warned not to take insulin without proper monitoring per orders. - Adjustment parameters are given to her for hypo and hyperglycemia in writing.  - she is not a candidate  for Metformin due to concurrent renal insufficiency.  - Specific targets for  A1c; LDL, HDL, and Triglycerides were discussed with the patient.  2) Blood Pressure /Hypertension:  her blood pressure is controlled to target for her age.   she is advised to continue her current medications including Bisoprolol 10 mg p.o. daily with breakfast.  3) Lipids/Hyperlipidemia:    Review of her recent lipid panel from 01/29/22 showed controlled LDL at 52 .  she is advised to continue Lipitor 80 mg daily at bedtime.  Side effects and precautions discussed with her.  4)  Weight/Diet:  her Body mass index is 33.37 kg/m.  -  clearly complicating her diabetes care.   she is a candidate for weight loss. I discussed with her the fact that loss of 5 - 10% of her  current body weight will have the most impact on her diabetes management.  Exercise, and detailed carbohydrates information provided  -  detailed on discharge instructions.  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    Her previsit TFTs are consistent with slight under-replacement.  She is advised to increase her Levothyroxine to 100 mcg po daily before breakfast.  Will recheck TFTs prior to next visit and adjust dose accordingly.  - The correct intake of thyroid hormone (Levothyroxine, Synthroid), is on empty stomach first thing in the morning, with water, separated by at least 30 minutes from breakfast and other medications,  and separated by more than 4 hours from calcium, iron, multivitamins,  acid reflux medications (PPIs).  - This medication is a life-long medication and will be needed to correct thyroid hormone imbalances for the rest of your life.  The dose may change from time to time, based on thyroid blood work.  - It is extremely important to be consistent taking this medication, near the same time each morning.  -AVOID TAKING PRODUCTS CONTAINING BIOTIN (commonly found in Hair, Skin, Nails vitamins) AS IT INTERFERES WITH THE VALIDITY OF THYROID FUNCTION BLOOD TESTS.  6) Chronic Care/Health Maintenance: -she is not on ACEI/ARB and is on Statin medications and is encouraged to initiate and continue to follow up with Ophthalmology, Dentist, Podiatrist at least yearly or according to recommendations, and advised to stay away from smoking. I have recommended yearly flu vaccine and pneumonia vaccine at least every 5 years; moderate intensity exercise for up to 150 minutes weekly; and sleep for at least 7 hours a day.  - she is advised to maintain close follow up with Marylynn Pearson, FNP for primary care needs, as well as her other providers for optimal and coordinated care.     I spent  51  minutes in the care of the patient today including review of labs from CMP, Lipids, Thyroid Function, Hematology (current and previous including abstractions from other facilities); face-to-face time discussing  her blood glucose readings/logs, discussing hypoglycemia and hyperglycemia episodes and symptoms, medications doses, her options of short and long term treatment based on the latest standards of care / guidelines;  discussion about incorporating lifestyle medicine;  and documenting the encounter. Risk reduction counseling performed per USPSTF guidelines to reduce obesity and cardiovascular risk factors.     Please refer to Patient Instructions for Blood Glucose Monitoring and Insulin/Medications Dosing Guide"  in media tab for additional information. Please  also refer to " Patient Self  Inventory" in the Media  tab for reviewed  elements of pertinent patient history.  Beatris Cofer Dowland participated in the discussions, expressed understanding, and voiced agreement with the above plans.  All questions were answered to her satisfaction. she is encouraged to contact clinic should she have any questions or concerns prior to her return visit.     Follow up plan: - Return in about 3 months (around 12/30/2022) for Diabetes F/U with A1c in office, Thyroid follow up, Previsit labs, Bring meter and logs.   Ronny Bacon, [facility] [address] [place], [place] [address] Phone: [phone] Fax: [phone]  09/29/2022, 9:37 AM

## 2022-09-29 NOTE — Patient Instructions (Signed)

## 2022-09-30 NOTE — Progress Notes (Signed)
See procedure note.

## 2022-10-02 ENCOUNTER — Other Ambulatory Visit: Payer: Self-pay | Admitting: Neurology

## 2022-10-02 DIAGNOSIS — G4733 Obstructive sleep apnea (adult) (pediatric): Secondary | ICD-10-CM

## 2022-10-02 NOTE — Procedures (Signed)
   [facility]  HOME SLEEP TEST (Watch PAT) REPORT  STUDY DATE: 09/29/2022  DOB: [birth year]  MRN: [id number]  ORDERING CLINICIAN: Huston Foley, MD, PhD   REFERRING CLINICIAN: Dr. Delia Heady  CLINICAL INFORMATION/HISTORY: 72 year old female with an underlying complex medical history of DVT, on Eliquis, fibromyalgia, reflux disease, hypertension, hyperlipidemia, hypothyroidism, MGUS, type 2 diabetes with suboptimal control, anemia, arthritis, asthma, right frontal stroke in April 2023, and obesity, who reports snoring and excessive daytime somnolence.   Epworth sleepiness score: 9/24.  BMI: 32.3 kg/m  FINDINGS:   Sleep Summary:   Total Recording Time (hours, min): 8 hours, 46 min  Total Sleep Time (hours, min):  7 hours, 15 min  Percent REM (%):    22.5%   Respiratory Indices:   Calculated pAHI (per hour):  18.9/hour         REM pAHI:    25.5/hour       NREM pAHI: 17/hour  Central pAHI: 1.3/hour  Oxygen Saturation Statistics:    Oxygen Saturation (%) Mean: 94%   Minimum oxygen saturation (%):                 96%   O2 Saturation Range (%): 86- 99%    O2 Saturation (minutes) <=88%: 0.1 min  Pulse Rate Statistics:   Pulse Mean (bpm):    98/min    Pulse Range (73 - 130/min)   IMPRESSION: OSA (obstructive sleep apnea)   RECOMMENDATION:  This home sleep test demonstrates moderate obstructive sleep apnea with a total AHI of 18.9/hour and O2 nadir of 86%.  Intermittent mild to moderate snoring was detected. Treatment with a positive airway pressure (PAP) device is recommended. The patient will be advised to proceed with an autoPAP titration/trial at home for now. A full night titration study may be considered to optimize treatment settings, monitor proper oxygen saturations and aid with improvement of tolerance and adherence, if needed down the road. Alternative treatment options may include a dental device through dentistry or orthodontics in  selected patients or Inspire (hypoglossal nerve stimulator) in carefully selected patients (meeting inclusion criteria).  Concomitant weight loss is recommended (where clinically appropriate). Please note that untreated obstructive sleep apnea may carry additional perioperative morbidity. Patients with significant obstructive sleep apnea should receive perioperative PAP therapy and the surgeons and particularly the anesthesiologist should be informed of the diagnosis and the severity of the sleep disordered breathing. The patient should be cautioned not to drive, work at heights, or operate dangerous or heavy equipment when tired or sleepy. Review and reiteration of good sleep hygiene measures should be pursued with any patient. Other causes of the patient's symptoms, including circadian rhythm disturbances, an underlying mood disorder, medication effect and/or an underlying medical problem cannot be ruled out based on this test. Clinical correlation is recommended.  The patient and her referring provider will be notified of the test results. The patient will be seen in follow up in sleep clinic at [facility].  I certify that I have reviewed the raw data recording prior to the issuance of this report in accordance with the standards of the American Academy of Sleep Medicine (AASM).  INTERPRETING PHYSICIAN:   Huston Foley, MD, PhD Medical Director, [facility]) Diplomat, ABPN (Neurology and Sleep)   [facility] [address], [address] [place], [place] [address] [phone]

## 2022-10-02 NOTE — Telephone Encounter (Signed)
Noted. Reviewed work up at [facility] [employer]. She has ongoing follow up with them in 01/2023.

## 2022-10-05 ENCOUNTER — Telehealth: Payer: Self-pay

## 2022-10-05 NOTE — Telephone Encounter (Addendum)
Tymeer Vaquera, Abbe Amsterdam, CMA  Dunn, Terri; Stephenson, [place]; Delia Chimes L New orders have been placed for the above pt, DOB: 2050-10-03 Thank  Coltrane, Janae Bridgeman, CMA; Arvella Merles; Adams, Melissa L PRINTED  THANKS

## 2022-10-05 NOTE — Telephone Encounter (Signed)
I called pt. I advised pt that Dr. Frances Furbish reviewed their sleep study results and found that pt moderate OSA. Dr. Frances Furbish recommends that pt starts autopap. I reviewed PAP compliance expectations with the pt. Pt is agreeable to starting a CPAP. I advised pt that an order will be sent to current DME, [facility], and [employer] will call the pt within about one week after they file with the pt's insurance. [facility] will show the pt how to use the machine, fit for masks, and troubleshoot the CPAP if needed. A follow up appt was made for insurance purposes with Jessica NO 10/21 115. Pt verbalized understanding to arrive 15 minutes early and bring their CPAP. Pt verbalized understanding of results. Pt had no questions at this time but was encouraged to call back if questions arise. I have sent the order to [facility] and have received confirmation that they have received the order.

## 2022-10-05 NOTE — Telephone Encounter (Signed)
-----   Message from Huston Foley sent at 10/02/2022  2:17 PM EDT ----- Patient referred by Dr. Pearlean Brownie, seen by me on 06/22/2022, patient had a HST on 09/29/2022.    Please call and notify the patient that the recent home sleep test showed obstructive sleep apnea in the moderate range. I recommend treatment in the form of autoPAP, which means, that we don't have to bring her in for a sleep study with CPAP, but will let her start using a so called autoPAP machine at home, which is a CPAP-like machine with self-adjusting pressures. We will send the order to a local DME company (of her choice, or as per insurance requirement). The DME representative will fit her with a mask, educate her on how to use the machine, how to put the mask on, etc. I have placed an order in the chart. Please send the order, talk to patient, send report to referring MD. We will need a FU in sleep clinic for 10 weeks post-PAP set up, please arrange that with me or one of our NPs. Also reinforce the need for compliance with treatment. Thanks,   Huston Foley, MD, PhD [facility])

## 2022-10-14 ENCOUNTER — Ambulatory Visit: Payer: 59 | Admitting: Cardiology

## 2022-10-22 ENCOUNTER — Ambulatory Visit: Payer: 59 | Admitting: Adult Health

## 2022-10-26 ENCOUNTER — Inpatient Hospital Stay: Payer: 59 | Attending: Hematology

## 2022-10-26 DIAGNOSIS — E1122 Type 2 diabetes mellitus with diabetic chronic kidney disease: Secondary | ICD-10-CM | POA: Insufficient documentation

## 2022-10-26 DIAGNOSIS — I129 Hypertensive chronic kidney disease with stage 1 through stage 4 chronic kidney disease, or unspecified chronic kidney disease: Secondary | ICD-10-CM | POA: Insufficient documentation

## 2022-10-26 DIAGNOSIS — N1831 Chronic kidney disease, stage 3a: Secondary | ICD-10-CM | POA: Insufficient documentation

## 2022-10-26 DIAGNOSIS — C8300 Small cell B-cell lymphoma, unspecified site: Secondary | ICD-10-CM

## 2022-10-26 DIAGNOSIS — D472 Monoclonal gammopathy: Secondary | ICD-10-CM | POA: Insufficient documentation

## 2022-10-26 LAB — COMPREHENSIVE METABOLIC PANEL
ALT: 37 U/L (ref 0–44)
AST: 24 U/L (ref 15–41)
Albumin: 3 g/dL — ABNORMAL LOW (ref 3.5–5.0)
Alkaline Phosphatase: 259 U/L — ABNORMAL HIGH (ref 38–126)
Anion gap: 9 (ref 5–15)
BUN: 26 mg/dL — ABNORMAL HIGH (ref 8–23)
CO2: 24 mmol/L (ref 22–32)
Calcium: 8.8 mg/dL — ABNORMAL LOW (ref 8.9–10.3)
Chloride: 102 mmol/L (ref 98–111)
Creatinine, Ser: 1.47 mg/dL — ABNORMAL HIGH (ref 0.44–1.00)
GFR, Estimated: 38 mL/min — ABNORMAL LOW (ref >60)
Glucose, Bld: 224 mg/dL — ABNORMAL HIGH (ref 70–99)
Potassium: 4.2 mmol/L (ref 3.5–5.1)
Sodium: 135 mmol/L (ref 135–145)
Total Bilirubin: 0.7 mg/dL (ref 0.3–1.2)
Total Protein: 8.7 g/dL — ABNORMAL HIGH (ref 6.5–8.1)

## 2022-10-26 LAB — CBC WITH DIFFERENTIAL/PLATELET
Abs Immature Granulocytes: 0.06 10*3/uL (ref 0.00–0.07)
Basophils Absolute: 0.1 10*3/uL (ref 0.0–0.1)
Basophils Relative: 1 %
Eosinophils Absolute: 0.6 10*3/uL — ABNORMAL HIGH (ref 0.0–0.5)
Eosinophils Relative: 6 %
HCT: 40.9 % (ref 36.0–46.0)
Hemoglobin: 12.8 g/dL (ref 12.0–15.0)
Immature Granulocytes: 1 %
Lymphocytes Relative: 24 %
Lymphs Abs: 2.7 10*3/uL (ref 0.7–4.0)
MCH: 26.6 pg (ref 26.0–34.0)
MCHC: 31.3 g/dL (ref 30.0–36.0)
MCV: 85 fL (ref 80.0–100.0)
Monocytes Absolute: 0.6 10*3/uL (ref 0.1–1.0)
Monocytes Relative: 6 %
Neutro Abs: 7 10*3/uL (ref 1.7–7.7)
Neutrophils Relative %: 62 %
Platelets: 234 10*3/uL (ref 150–400)
RBC: 4.81 MIL/uL (ref 3.87–5.11)
RDW: 13.3 % (ref 11.5–15.5)
WBC: 11 10*3/uL — ABNORMAL HIGH (ref 4.0–10.5)
nRBC: 0 % (ref 0.0–0.2)

## 2022-10-26 LAB — LACTATE DEHYDROGENASE: LDH: 139 U/L (ref 98–192)

## 2022-10-28 LAB — KAPPA/LAMBDA LIGHT CHAINS
Kappa free light chain: 896.7 mg/L — ABNORMAL HIGH (ref 3.3–19.4)
Kappa, lambda light chain ratio: 40.76 — ABNORMAL HIGH (ref 0.26–1.65)
Lambda free light chains: 22 mg/L (ref 5.7–26.3)

## 2022-10-28 LAB — BETA 2 MICROGLOBULIN, SERUM: Beta-2 Microglobulin: 5.1 mg/L — ABNORMAL HIGH (ref 0.6–2.4)

## 2022-10-29 LAB — PROTEIN ELECTROPHORESIS, SERUM
A/G Ratio: 0.6 — ABNORMAL LOW (ref 0.7–1.7)
Albumin ELP: 3.1 g/dL (ref 2.9–4.4)
Alpha-1-Globulin: 0.3 g/dL (ref 0.0–0.4)
Alpha-2-Globulin: 1.1 g/dL — ABNORMAL HIGH (ref 0.4–1.0)
Beta Globulin: 1.3 g/dL (ref 0.7–1.3)
Gamma Globulin: 2.5 g/dL — ABNORMAL HIGH (ref 0.4–1.8)
Globulin, Total: 5.1 g/dL — ABNORMAL HIGH (ref 2.2–3.9)
M-Spike, %: 2 g/dL — ABNORMAL HIGH
Total Protein ELP: 8.2 g/dL (ref 6.0–8.5)

## 2022-10-30 LAB — UPEP/UIFE/LIGHT CHAINS/TP, 24-HR UR
% BETA, Urine: 19.3 %
ALPHA 1 URINE: 4.5 %
Albumin, U: 59.2 %
Alpha 2, Urine: 7.6 %
Free Kappa Lt Chains,Ur: 22.65 mg/L (ref 1.17–86.46)
Free Kappa/Lambda Ratio: 8.61 (ref 1.83–14.26)
Free Lambda Lt Chains,Ur: 2.63 mg/L (ref 0.27–15.21)
GAMMA GLOBULIN URINE: 9.4 %
M-SPIKE %, Urine: 4.1 % — ABNORMAL HIGH
M-Spike, Mg/24 Hr: 11 mg/(24.h) — ABNORMAL HIGH
Total Protein, Urine-Ur/day: 278 mg/(24.h) — ABNORMAL HIGH (ref 30–150)
Total Protein, Urine: 12.1 mg/dL
Total Volume: 2300

## 2022-11-01 LAB — IMMUNOFIXATION ELECTROPHORESIS
IgA: 192 mg/dL (ref 64–422)
IgG (Immunoglobin G), Serum: 3441 mg/dL — ABNORMAL HIGH (ref 586–1602)
IgM (Immunoglobulin M), Srm: 51 mg/dL (ref 26–217)
Total Protein ELP: 8.4 g/dL (ref 6.0–8.5)

## 2022-11-01 NOTE — Progress Notes (Signed)
[facility] [address], [place] [address]    Clinic Day:  11/02/2022  Referring physician: Marylynn Pearson, FNP  Patient Care Team: Marylynn Pearson, FNP as PCP - General (Family Medicine) Jonelle Sidle, MD as PCP - Cardiology (Cardiology) Jena Gauss Gerrit Friends, MD as Consulting Physician (Gastroenterology) Pearson Grippe, MD (Internal Medicine) Dani Gobble, NP as Nurse Practitioner (Nurse Practitioner)   ASSESSMENT & PLAN:   Assessment: 1.  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 - History of nephrotic range proteinuria, resolved now. - Skeletal survey (03/10/2022): Possible mottled lucency of the anterior aspect of the L1 vertebral body and mildly increased lucency of the inferior endplate of L2, requires additional dedicated cross-sectional imaging for better characterization CT lumbar spine (04/07/2022): Lytic lesion along superior endplate of L2 vertebral body and may be degenerative in etiology.  No definite suspicious lytic or blastic osseous lesions. MRI lumbar spine (05/08/2022) was negative for evidence of multiple myeloma, but showed multilevel Schmorl's nodes felt to account for lucency within the L2 superior endplate seen on CT, as well as mildly progressive multilevel spondylosis. - Most recent myeloma panel (07/09/2022) continues to show labs trending upwards: M-spike trending upwards at 2.0 (significantly elevated from M spike around 1.3 one year ago) Immunofixation PENDING Free light chains (last checked 03/10/2022) remain elevated but relatively stable with elevated kappa 790.4, normal lambda 19.0, and stable but elevated ratio 41.60. No CRAB features: Calcium 8.6.  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 Normal Hgb 12.5. LDH normal - Bone marrow biopsy (03/31/2022): Hypercellular  bone marrow for age with plasmacytosis (plasma cells 13% of all cells).  Minor B-cell population with kappa light chain excess.  Per pathology report, unclear whether findings represent lymphoid component of a neoplastic lymphoplasmacytic disorder or an independent disease process such as monoclonal B-cell lymphocytosis. Molecular pathology was negative for any abnormal mutations Cytogenetics showed normal female karyotype (46,XX[20]) MYD88 mutation detected [c.794T>C (p.L265P)] - this mutation favors diagnosis of lymphoplasmacytic lymphoma, and may indicate higher risk of disease progression and greater disease burden  - Reports increased low back pain with radiating pain and numbness/tingling going down Stephanie [name] left leg. - Recent fever (Tmax 100.0 F) associated with acute bronchitis.  Drenching night sweats x 2 weeks in April 2024.  No shaking chills or unintentional weight loss.  Reports early satiety; no nausea or abdominal pain. - No lymphadenopathy or palpable splenomegaly on exam. - DISCUSSION: Based on most recent bone marrow biopsy results (13% plasma cells), patient now meets criteria for SMOLDERING MYELOMA presence of IgG kappa monoclonal gammopathy and absence of CRAB features.  However, clonal B-cell population and presence of MYD88 mutation show evidence of LYMPHOPLASMACYTIC LYMPHOMA (non-IgM).  Differential diagnosis includes rare cooccurrence of smoldering myeloma (IgG) AND lymphoplasmacytic lymphoma ** OR ** rare variant of WM with IgG secretion.   2.  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 - Stephanie [name] is taking Eliquis without any major bleeding events        3.  Leukocytosis: - Intermittent leukocytosis since at least 2012 - Denies any chronic steroid use. - Stephanie [name] is a non-smoker - BCR-ABL FISH is negative.  JAK2 with reflex is negative - Most recent labs (07/09/2022): WBC normal at 9.4 with normal differential   4.  Microcytic anemia,  RESOLVED - Previously known to  be iron deficient. - Last received IV iron on 06/25/2017 and 07/16/2017. - Not currently on iron supplements. - No bright red blood per rectum or melena - Most recent iron panel (03/10/2022): Ferritin 129, iron saturation 35% - Most recent CBC (07/09/2022) with Hgb 12.5    5. CKD stage IIIa - Renal biopsy (08/29/2021) consistent with kidney disease from diabetes and hypertension - Baseline creatinine 1.1-1.4   6.  Elevated liver enzymes: - Ultrasound abdomen on 04/25/2018 showed fatty liver. - Stephanie [name] has chronically elevated AST, ALT, and alkaline phosphatase - Liver biopsy (10/15/2020): Minimally active steatohepatitis, mild portal and centrilobular fibrosis   Plan: 1.  IgG kappa smoldering myeloma/lymphoplasmacytic lymphoma: - Stephanie [name] denies any fevers or weight loss.  No infections since last visit. - Stephanie [name] has night sweats, drenching type since April 2024, once every 2 weeks. - Reviewed CT CAP from 09/21/2022: No evidence of lymphadenopathy or metastatic disease. - Labs from 10/26/2022: M spike stable at 2 g.  LDH is normal.  Creatinine 1.47 stable.  CBC was grossly normal.  Free kappa light chains are 896 with ratio 40.7.  Immunofixation shows IgG kappa. - 24-hour urine: Total protein is 278 mg.  Immunofixation positive for kappa type Bence-Jones protein. - I reviewed bone marrow biopsy results from 03/31/2022: Hypercellular marrow with plasma cells 13% displaying kappa light chain restriction.  Significant lymphoid aggregates/lymphocytes not seen.  Flow cytometry showed minor B-cell population representing 3% of cells with kappa light chain excess.  Lymphoid findings are atypical and similar to those seen on previous biopsy in 2017.  It is not clear if it is from component of lymphoplasmacytic lymphoma or smoldering myeloma with monoclonal B-cell lymphocytosis.  Multiple myeloma FISH panel was normal.  Cytogenetics was normal.  MYD 88 is positive, favoring lymphoplasmacytic  lymphoma. - No indication for treatment at this time. - Recommend follow-up in 4 months with repeat labs.  2.  Bilateral DVT: - Continue Eliquis.  No bleeding issues.   Orders Placed This Encounter  Procedures   CBC with Differential    Standing Status:   Future    Standing Expiration Date:   11/02/2023   Comprehensive metabolic panel    Standing Status:   Future    Standing Expiration Date:   11/02/2023   Lactate dehydrogenase    Standing Status:   Future    Standing Expiration Date:   11/02/2023   Protein electrophoresis, serum    Standing Status:   Future    Standing Expiration Date:   11/02/2023   Kappa/Lambda Light Chains, Free, With Ratio, 24Hr. Urine    Standing Status:   Future    Standing Expiration Date:   11/02/2023      I,Stephanie [name],acting as a scribe for Doreatha Massed, MD.,have documented all relevant documentation on the behalf of Doreatha Massed, MD,as directed by  Doreatha Massed, MD while in the presence of Doreatha Massed, MD.   I, Doreatha Massed MD, have reviewed the above documentation for accuracy and completeness, and I agree with the above.   Doreatha Massed, MD   9/16/20243:52 PM  CHIEF COMPLAINT:   Diagnosis: MGUS and history of DVT   Cancer Staging  No matching staging information was found for the patient.    Prior Therapy: none  Current Therapy:  eliquis   HISTORY OF PRESENT ILLNESS:   Oncology History   No history exists.     INTERVAL HISTORY:   Stephanie [name] is a 72 y.o. female presenting to clinic today for follow up  of MGUS and history of DVT. Stephanie [name] was last seen by PA Rebekah on 07/20/22.  Since Stephanie [name] last visit, Stephanie [name] underwent surveillance CT C/A/P on 09/21/22 showing: no evidence of lymphadenopathy or metastatic disease; normal spleen.  Today, Stephanie [name] states that Stephanie [name] is doing well overall. Stephanie [name] appetite level is at 60%. Stephanie [name] energy level is at 10%.  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   Type 2 diabetes mellitus (HCC)     Surgical History: Past Surgical History:  Procedure Laterality Date   ABDOMINAL HYSTERECTOMY  partial   CARPAL TUNNEL RELEASE Right 1991   CATARACT EXTRACTION W/PHACO Right 05/08/2013   Procedure: CATARACT EXTRACTION PHACO AND INTRAOCULAR LENS PLACEMENT (IOC);  Surgeon: Gemma Payor, MD;  Location: [facility];  Service: Ophthalmology;  Laterality: Right;  CDE 10.31   CATARACT EXTRACTION W/PHACO Left 08/17/2013   Procedure: CATARACT EXTRACTION PHACO AND INTRAOCULAR LENS PLACEMENT (IOC);  Surgeon: Gemma Payor, MD;  Location: [facility];  Service: Ophthalmology;  Laterality: Left;  CDE:9.03   CHOLECYSTECTOMY  1971   COLONOSCOPY WITH PROPOFOL N/A 01/06/2016   Dr. Jena Gauss: diverticulosis    DENTAL SURGERY     ESOPHAGEAL BRUSHING  08/29/2019   Procedure: ESOPHAGEAL BRUSHING;  Surgeon: Corbin Ade, MD;  Location: [facility];  Service: Endoscopy;;   ESOPHAGOGASTRODUODENOSCOPY (EGD) WITH PROPOFOL N/A 01/06/2016   Dr. Jena Gauss: normal s/p empiric dilation    ESOPHAGOGASTRODUODENOSCOPY (EGD) WITH PROPOFOL N/A 08/29/2019   esophageal plaques vs medication residue adherent to tubular esophagus s/p KOH brushing and dilation. Medium-sized hiatal hernia. + for candida. Diflucan.    EYE SURGERY     IR BONE MARROW BIOPSY & ASPIRATION  03/31/2022   MALONEY DILATION N/A 01/06/2016   Procedure: Elease Hashimoto DILATION;  Surgeon: Corbin Ade, MD;  Location: [facility];  Service: Endoscopy;   Laterality: N/A;   MALONEY DILATION N/A 08/29/2019   Procedure: Elease Hashimoto DILATION;  Surgeon: Corbin Ade, MD;  Location: [facility];  Service: Endoscopy;  Laterality: N/A;   REVERSE SHOULDER ARTHROPLASTY Right 08/06/2017   Procedure: RIGHT REVERSE SHOULDER ARTHROPLASTY;  Surgeon: Beverely Low, MD;  [facility];  Service: Orthopedics;  Laterality: Right;   WRIST GANGLION EXCISION Left     Social History: Social History   Socioeconomic History   Marital status: Married    Spouse name: Not on file   Number of children: Not on file   Years of education: Not on file   Highest education level: Not on file  Occupational History   Not on file  Tobacco Use   Smoking status: Former    Current packs/day: 0.00    Average packs/day: 0.3 packs/day for 30.0 years (7.5 ttl pk-yrs)    Types: Cigarettes    Start date: 02/16/1981    Quit date: 02/17/2011    Years since quitting: 11.7   Smokeless tobacco: Never  Vaping Use   Vaping status: Never  Used  Substance and Sexual Activity   Alcohol use: Not Currently    Comment: rare   Drug use: No   Sexual activity: Not Currently    Birth control/protection: Surgical  Other Topics Concern   Not on file  Social History Narrative   Not on file   Social Determinants of Health   Financial Resource Strain: Not on file  Food Insecurity: Low Risk  (08/12/2022)   [facility]   Hunger Vital Sign    Worried About Running Out of Food in the Last Year: Never true    Ran Out of Food in the Last Year: Never true  Transportation Needs: No Transportation Needs (08/12/2022)   [facility]   Transportation    In the past 12 months, has lack of reliable transportation kept you from medical appointments, meetings, work or from getting things needed for daily living? : No  Physical Activity: Not on file  Stress: Not on file  Social Connections: Not on file  Intimate Partner Violence: Not on file     Family History: Family History  Problem Relation Age of Onset   Hypertension Mother    Diabetes Mother    COPD Mother    Arthritis Mother    Diabetes Father    Arthritis Father    Dementia Father    CAD Father    Hypothyroidism Sister    Diabetes Brother    Colon cancer Niece    Colon polyps Neg Hx    Sleep apnea Neg Hx     Current Medications:  Current Outpatient Medications:    Accu-Chek FastClix Lancets MISC, Apply topically., Disp: , Rfl:    ACCU-CHEK GUIDE test strip, 4 (four) times daily., Disp: , Rfl:    acetaminophen (TYLENOL) 500 MG tablet, Take 1,000 mg by mouth every 6 (six) hours as needed for moderate pain or headache., Disp: , Rfl:    albuterol (PROVENTIL) (2.5 MG/3ML) 0.083% nebulizer solution, Take 2.5 mg by nebulization every 6 (six) hours as needed for wheezing or shortness of breath., Disp: , Rfl:    albuterol (VENTOLIN HFA) 108 (90 Base) MCG/ACT inhaler, Inhale 1-2 puffs into the lungs every 6 (six) hours as needed for wheezing or shortness of breath., Disp: 18 g, Rfl: 0   Alcohol Swabs (ALCOHOL PADS) 70 % PADS, SMARTSIG:Pledget(s) Topical 4 Times Daily, Disp: , Rfl:    apixaban (ELIQUIS) 5 MG TABS tablet, Take 1 tablet (5 mg total) by mouth 2 (two) times daily., Disp: , Rfl:    Ascorbic Acid (VITAMIN C) 1000 MG tablet, Take 500 mg by mouth daily., Disp: , Rfl:    atorvastatin (LIPITOR) 80 MG tablet, Take 1 tablet (80 mg total) by mouth daily., Disp: 30 tablet, Rfl: 0   bisoprolol (ZEBETA) 10 MG tablet, TAKE 1 TABLET(10 MG) BY MOUTH IN THE MORNING AND AT BEDTIME, Disp: 180 tablet, Rfl: 3   Blood Glucose Calibration (ACCU-CHEK GUIDE CONTROL) LIQD, See admin instructions., Disp: , Rfl:    Blood Glucose Monitoring Suppl (ACCU-CHEK GUIDE ME) w/Device KIT, 4 (four) times daily., Disp: , Rfl:    Cetirizine HCl 10 MG CAPS, Take 10 mg by mouth daily., Disp: , Rfl:    Cholecalciferol (VITAMIN D) 2000 units tablet, Take 4,000 Units by mouth daily. , Disp: , Rfl:     cimetidine (TAGAMET) 200 MG tablet, Take 0.5 tablets (100 mg total) by mouth daily as needed. (Patient taking differently: Take 100 mg by mouth  daily as needed (indigestion).), Disp: , Rfl:    CINNAMON PO, Take 1-2 capsules by mouth 3 (three) times daily., Disp: , Rfl:    clobetasol ointment (TEMOVATE) 0.05 %, Apply 1 application topically 3 (three) times daily as needed (imflammation)., Disp: , Rfl:    diphenhydrAMINE (BENADRYL) 25 mg capsule, Take 50 mg by mouth every 4 (four) hours as needed for itching., Disp: , Rfl:    DULoxetine (CYMBALTA) 60 MG capsule, Take 60 mg by mouth 2 (two) times daily., Disp: , Rfl:    EASY COMFORT PEN NEEDLES 31G X 5 MM MISC, INJECT IN SULIN EVERY DAY AS DIRECTED, Disp: , Rfl:    empagliflozin (JARDIANCE) 10 MG TABS tablet, Take 1 tablet (10 mg total) by mouth daily., Disp: 90 tablet, Rfl: 3   fluticasone (VERAMYST) 27.5 MCG/SPRAY nasal spray, Place 2 sprays into the nose daily., Disp: , Rfl:    guaiFENesin (ROBITUSSIN) 100 MG/5ML SOLN, Take 5 mLs (100 mg total) by mouth every 4 (four) hours as needed for cough or to loosen phlegm., Disp: 236 mL, Rfl: 0   hydrOXYzine (ATARAX/VISTARIL) 25 MG tablet, Take 25 mg by mouth every 8 (eight) hours as needed for itching. , Disp: , Rfl:    insulin degludec (TRESIBA FLEXTOUCH) 100 UNIT/ML FlexTouch Pen, Inject 20 Units into the skin at bedtime., Disp: 18 mL, Rfl: 3   insulin lispro (HUMALOG KWIKPEN) 100 UNIT/ML KwikPen, Inject 5-11 Units into the skin 3 (three) times daily. Sliding Scale, Disp: 30 mL, Rfl: 3   ipratropium-albuterol (DUONEB) 0.5-2.5 (3) MG/3ML SOLN, Take 3 mLs by nebulization every 6 (six) hours as needed (shortness of breath)., Disp: , Rfl:    Lancet Devices (EASY MINI EJECT LANCING DEVICE) MISC, 4 (four) times daily., Disp: , Rfl:    levothyroxine (SYNTHROID) 100 MCG tablet, Take 1 tablet (100 mcg total) by mouth daily before breakfast., Disp: 90 tablet, Rfl: 1   lidocaine (LIDODERM) 5 %, Place 1 patch onto the  skin daily. Remove & Discard patch within 12 hours or as directed by MD, Disp: 30 patch, Rfl: 0   lubiprostone (AMITIZA) 24 MCG capsule, Take 1 capsule (24 mcg total) by mouth 2 (two) times daily with a meal., Disp: 60 capsule, Rfl: 11   LYRICA 75 MG capsule, Take 75 mg by mouth 2 (two) times daily. , Disp: , Rfl:    methocarbamol (ROBAXIN) 500 MG tablet, Take 1 tablet (500 mg total) by mouth 3 (three) times daily as needed. (Patient taking differently: Take 500 mg by mouth 3 (three) times daily as needed for muscle spasms.), Disp: 60 tablet, Rfl: 1   montelukast (SINGULAIR) 10 MG tablet, Take 10 mg by mouth at bedtime., Disp: , Rfl:    pantoprazole (PROTONIX) 40 MG tablet, Take 1 tablet (40 mg total) by mouth daily before breakfast., Disp: 90 tablet, Rfl: 3   REPATHA SURECLICK 140 MG/ML SOAJ, ADMINISTER 1 ML UNDER THE SKIN EVERY 14 DAYS, Disp: 2 mL, Rfl: 3   REPATHA SURECLICK 140 MG/ML SOAJ, Inject 140 mg into the skin every 14 (fourteen) days., Disp: 2 mL, Rfl: 6   RESTASIS 0.05 % ophthalmic emulsion, Place 1 drop into both eyes 2 (two) times daily., Disp: , Rfl:    telmisartan (MICARDIS) 20 MG tablet, Take 40 mg by mouth daily., Disp: , Rfl:    tirzepatide (MOUNJARO) 10 MG/0.5ML Pen, Inject 10 mg into the skin once a week., Disp: 6 mL, Rfl: 1   torsemide (DEMADEX) 20 MG tablet, Take 2  tablets (40 mg total) by mouth daily., Disp: 60 tablet, Rfl: 0   traMADol (ULTRAM) 50 MG tablet, Take 50 mg by mouth 3 (three) times daily as needed for moderate pain., Disp: , Rfl: 1   traZODone (DESYREL) 150 MG tablet, Take 150 mg by mouth at bedtime., Disp: , Rfl:    ursodiol (ACTIGALL) 300 MG capsule, Take 300 mg by mouth 2 (two) times daily., Disp: , Rfl:    vitamin B-12 (CYANOCOBALAMIN) 1000 MCG tablet, Take 1,000 mcg by mouth daily., Disp: , Rfl:    Allergies: Allergies  Allergen Reactions   Tetracyclines & Related Anaphylaxis and Rash   Banana Hives and Nausea And Vomiting   Penicillins Rash and Other  (See Comments)    Has patient had a PCN reaction causing immediate rash, facial/tongue/throat swelling, SOB or lightheadedness with hypotension: No Has patient had a PCN reaction causing severe rash involving mucus membranes or skin necrosis: No Has patient had a PCN reaction that required hospitalization No Has patient had a PCN reaction occurring within the last 10 years: No If all of the above answers are "NO", then may proceed with Cephalosporin use.     REVIEW OF SYSTEMS:   Review of Systems  Constitutional:  Negative for chills, fatigue and fever.  HENT:   Negative for lump/mass, mouth sores, nosebleeds, sore throat and trouble swallowing.   Eyes:  Negative for eye problems.  Respiratory:  Positive for cough and shortness of breath.   Cardiovascular:  Negative for chest pain, leg swelling and palpitations.  Gastrointestinal:  Positive for constipation and diarrhea. Negative for abdominal pain, nausea and vomiting.  Genitourinary:  Negative for bladder incontinence, difficulty urinating, dysuria, frequency, hematuria and nocturia.   Musculoskeletal:  Negative for arthralgias, back pain, flank pain, myalgias and neck pain.  Skin:  Negative for itching and rash.  Neurological:  Positive for headaches and numbness. Negative for dizziness.  Hematological:  Does not bruise/bleed easily.  Psychiatric/Behavioral:  Positive for depression. Negative for sleep disturbance and suicidal ideas. The patient is not nervous/anxious.   All other systems reviewed and are negative.    VITALS:   Blood pressure 134/73, pulse 92, temperature 98.5 F (36.9 C), temperature source Oral, resp. rate 18, weight 226 lb (102.5 kg), SpO2 100%.  Wt Readings from Last 3 Encounters:  11/02/22 226 lb (102.5 kg)  09/29/22 226 lb (102.5 kg)  07/20/22 227 lb 1.2 oz (103 kg)    Body mass index is 33.37 kg/m.  Performance status (ECOG): 1 - Symptomatic but completely ambulatory  PHYSICAL EXAM:   Physical  Exam Vitals and nursing note reviewed. Exam conducted with a chaperone present.  Constitutional:      Appearance: Normal appearance.  Cardiovascular:     Rate and Rhythm: Normal rate and regular rhythm.     Pulses: Normal pulses.     Heart sounds: Normal heart sounds.  Pulmonary:     Effort: Pulmonary effort is normal.     Breath sounds: Normal breath sounds.  Abdominal:     Palpations: Abdomen is soft. There is no hepatomegaly, splenomegaly or mass.     Tenderness: There is no abdominal tenderness.  Musculoskeletal:     Right lower leg: No edema.     Left lower leg: No edema.  Lymphadenopathy:     Cervical: No cervical adenopathy.     Right cervical: No superficial, deep or posterior cervical adenopathy.    Left cervical: No superficial, deep or posterior cervical adenopathy.  Upper Body:     Right upper body: No supraclavicular or axillary adenopathy.     Left upper body: No supraclavicular or axillary adenopathy.  Neurological:     General: No focal deficit present.     Mental Status: Stephanie [name] is alert and oriented to person, place, and time.  Psychiatric:        Mood and Affect: Mood normal.        Behavior: Behavior normal.     LABS:      Latest Ref Rng & Units 10/26/2022   11:11 AM 07/09/2022    3:06 PM 03/31/2022    7:45 AM  CBC  WBC 4.0 - 10.5 K/uL 11.0  9.4  13.2   Hemoglobin 12.0 - 15.0 g/dL 16.1  09.6  04.5   Hematocrit 36.0 - 46.0 % 40.9  41.6  43.6   Platelets 150 - 400 K/uL 234  200  273       Latest Ref Rng & Units 10/26/2022   11:11 AM 09/17/2022   12:11 PM 07/09/2022    3:06 PM  CMP  Glucose 70 - 99 mg/dL 409  811  914   BUN 8 - 23 mg/dL 26  21  35   Creatinine 0.44 - 1.00 mg/dL 7.82  9.56  2.13   Sodium 135 - 145 mmol/L 135  135  134   Potassium 3.5 - 5.1 mmol/L 4.2  3.8  4.6   Chloride 98 - 111 mmol/L 102  106  104   CO2 22 - 32 mmol/L 24  22  24    Calcium 8.9 - 10.3 mg/dL 8.8  8.6  8.6   Total Protein 6.5 - 8.1 g/dL 8.7  8.2  8.9   Total Bilirubin  0.3 - 1.2 mg/dL 0.7  0.4  0.5   Alkaline Phos 38 - 126 U/L 259  219  339   AST 15 - 41 U/L 24  31  35   ALT 0 - 44 U/L 37  31  46      No results found for: "CEA1", "CEA" / No results found for: "CEA1", "CEA" No results found for: "PSA1" No results found for: "CAN199" No results found for: "CAN125"  Lab Results  Component Value Date   TOTALPROTELP 8.2 10/26/2022   ALBUMINELP 3.1 10/26/2022   A1GS 0.3 10/26/2022   A2GS 1.1 (H) 10/26/2022   BETS 1.3 10/26/2022   GAMS 2.5 (H) 10/26/2022   MSPIKE 2.0 (H) 10/26/2022   SPEI Comment 10/26/2022   Lab Results  Component Value Date   TIBC 274 03/10/2022   TIBC 295 10/08/2021   TIBC 291 05/12/2021   FERRITIN 129 03/10/2022   FERRITIN 112 10/16/2021   FERRITIN 81 05/12/2021   IRONPCTSAT 35 (H) 03/10/2022   IRONPCTSAT 28 10/08/2021   IRONPCTSAT 19 05/12/2021   Lab Results  Component Value Date   LDH 139 10/26/2022   LDH 184 07/09/2022   LDH 167 03/10/2022     STUDIES:   No results found.

## 2022-11-02 ENCOUNTER — Inpatient Hospital Stay (HOSPITAL_BASED_OUTPATIENT_CLINIC_OR_DEPARTMENT_OTHER): Payer: 59 | Admitting: Hematology

## 2022-11-02 VITALS — BP 134/73 | HR 92 | Temp 98.5°F | Resp 18 | Wt 226.0 lb

## 2022-11-02 DIAGNOSIS — D472 Monoclonal gammopathy: Secondary | ICD-10-CM

## 2022-11-02 DIAGNOSIS — C8300 Small cell B-cell lymphoma, unspecified site: Secondary | ICD-10-CM

## 2022-11-25 ENCOUNTER — Ambulatory Visit: Payer: 59 | Attending: Cardiology | Admitting: Cardiology

## 2022-11-29 ENCOUNTER — Other Ambulatory Visit: Payer: Self-pay | Admitting: Neurology

## 2022-12-07 ENCOUNTER — Ambulatory Visit: Payer: 59 | Admitting: Adult Health

## 2022-12-09 ENCOUNTER — Other Ambulatory Visit: Payer: Self-pay | Admitting: Nurse Practitioner

## 2022-12-15 ENCOUNTER — Telehealth: Payer: Self-pay | Admitting: Neurology

## 2022-12-15 NOTE — Telephone Encounter (Signed)
Noted  

## 2022-12-15 NOTE — Telephone Encounter (Signed)
For documentation and FYI pt requested her initial CPAP f/u to be scheduled and to also be able to have a stroke f/u.  Pt accepted 01/25/23 at 3:15, pt aware to check in at 2:45 for appointment, pt was informed on 10-28 to bring CPAP and power cord to this appointment. This request (for initial CPAP f/u and  stroke f/u) was approved by Shanda Bumps, NP.  This is FYI no call back requested by pt.

## 2022-12-24 ENCOUNTER — Other Ambulatory Visit (HOSPITAL_COMMUNITY)
Admission: RE | Admit: 2022-12-24 | Discharge: 2022-12-24 | Disposition: A | Discharge status: Home / Self Care | Payer: 59 | Source: Ambulatory Visit | Attending: Nurse Practitioner | Admitting: Nurse Practitioner

## 2022-12-24 DIAGNOSIS — E039 Hypothyroidism, unspecified: Secondary | ICD-10-CM | POA: Diagnosis present

## 2022-12-24 LAB — T4, FREE: Free T4: 0.92 ng/dL (ref 0.61–1.12)

## 2022-12-24 LAB — TSH: TSH: 1.555 u[IU]/mL (ref 0.350–4.500)

## 2022-12-30 ENCOUNTER — Ambulatory Visit: Payer: 59 | Admitting: Nurse Practitioner

## 2022-12-30 DIAGNOSIS — I10 Essential (primary) hypertension: Secondary | ICD-10-CM

## 2022-12-30 DIAGNOSIS — Z794 Long term (current) use of insulin: Secondary | ICD-10-CM

## 2022-12-30 DIAGNOSIS — E782 Mixed hyperlipidemia: Secondary | ICD-10-CM

## 2022-12-30 DIAGNOSIS — Z7984 Long term (current) use of oral hypoglycemic drugs: Secondary | ICD-10-CM

## 2022-12-30 DIAGNOSIS — E039 Hypothyroidism, unspecified: Secondary | ICD-10-CM

## 2022-12-30 DIAGNOSIS — Z7985 Long-term (current) use of injectable non-insulin antidiabetic drugs: Secondary | ICD-10-CM

## 2023-01-08 ENCOUNTER — Emergency Department (HOSPITAL_COMMUNITY): Payer: 59

## 2023-01-08 ENCOUNTER — Ambulatory Visit: Payer: 59

## 2023-01-08 ENCOUNTER — Other Ambulatory Visit: Payer: Self-pay

## 2023-01-08 ENCOUNTER — Inpatient Hospital Stay (HOSPITAL_COMMUNITY)
Admission: EM | Admit: 2023-01-08 | Discharge: 2023-01-12 | DRG: 177 | Disposition: A | Discharge status: Home / Self Care | Payer: 59 | Attending: Internal Medicine | Admitting: Internal Medicine

## 2023-01-08 ENCOUNTER — Ambulatory Visit
Admission: RE | Admit: 2023-01-08 | Discharge: 2023-01-08 | Disposition: A | Discharge status: Home / Self Care | Payer: 59 | Source: Ambulatory Visit | Attending: Nurse Practitioner | Admitting: Nurse Practitioner

## 2023-01-08 ENCOUNTER — Encounter (HOSPITAL_COMMUNITY): Payer: Self-pay

## 2023-01-08 VITALS — BP 183/74 | HR 115 | Temp 98.3°F | Resp 22

## 2023-01-08 DIAGNOSIS — E1169 Type 2 diabetes mellitus with other specified complication: Secondary | ICD-10-CM

## 2023-01-08 DIAGNOSIS — I5033 Acute on chronic diastolic (congestive) heart failure: Secondary | ICD-10-CM | POA: Diagnosis present

## 2023-01-08 DIAGNOSIS — E119 Type 2 diabetes mellitus without complications: Secondary | ICD-10-CM

## 2023-01-08 DIAGNOSIS — N1832 Chronic kidney disease, stage 3b: Secondary | ICD-10-CM | POA: Diagnosis present

## 2023-01-08 DIAGNOSIS — Z881 Allergy status to other antibiotic agents status: Secondary | ICD-10-CM

## 2023-01-08 DIAGNOSIS — Z7901 Long term (current) use of anticoagulants: Secondary | ICD-10-CM

## 2023-01-08 DIAGNOSIS — I82403 Acute embolism and thrombosis of unspecified deep veins of lower extremity, bilateral: Secondary | ICD-10-CM | POA: Diagnosis present

## 2023-01-08 DIAGNOSIS — T380X5A Adverse effect of glucocorticoids and synthetic analogues, initial encounter: Secondary | ICD-10-CM | POA: Diagnosis present

## 2023-01-08 DIAGNOSIS — Z96611 Presence of right artificial shoulder joint: Secondary | ICD-10-CM | POA: Diagnosis present

## 2023-01-08 DIAGNOSIS — Z6834 Body mass index (BMI) 34.0-34.9, adult: Secondary | ICD-10-CM

## 2023-01-08 DIAGNOSIS — Z91018 Allergy to other foods: Secondary | ICD-10-CM

## 2023-01-08 DIAGNOSIS — Z79890 Hormone replacement therapy (postmenopausal): Secondary | ICD-10-CM

## 2023-01-08 DIAGNOSIS — J4550 Severe persistent asthma, uncomplicated: Secondary | ICD-10-CM | POA: Diagnosis present

## 2023-01-08 DIAGNOSIS — Z820 Family history of epilepsy and other diseases of the nervous system: Secondary | ICD-10-CM

## 2023-01-08 DIAGNOSIS — R0781 Pleurodynia: Secondary | ICD-10-CM | POA: Diagnosis not present

## 2023-01-08 DIAGNOSIS — R0602 Shortness of breath: Principal | ICD-10-CM

## 2023-01-08 DIAGNOSIS — Z880 Allergy status to penicillin: Secondary | ICD-10-CM

## 2023-01-08 DIAGNOSIS — J4489 Other specified chronic obstructive pulmonary disease: Secondary | ICD-10-CM | POA: Diagnosis present

## 2023-01-08 DIAGNOSIS — Z794 Long term (current) use of insulin: Secondary | ICD-10-CM

## 2023-01-08 DIAGNOSIS — Z7984 Long term (current) use of oral hypoglycemic drugs: Secondary | ICD-10-CM

## 2023-01-08 DIAGNOSIS — E7800 Pure hypercholesterolemia, unspecified: Secondary | ICD-10-CM | POA: Diagnosis present

## 2023-01-08 DIAGNOSIS — Z8709 Personal history of other diseases of the respiratory system: Secondary | ICD-10-CM | POA: Diagnosis not present

## 2023-01-08 DIAGNOSIS — R079 Chest pain, unspecified: Secondary | ICD-10-CM | POA: Diagnosis present

## 2023-01-08 DIAGNOSIS — Z8673 Personal history of transient ischemic attack (TIA), and cerebral infarction without residual deficits: Secondary | ICD-10-CM

## 2023-01-08 DIAGNOSIS — J9611 Chronic respiratory failure with hypoxia: Secondary | ICD-10-CM | POA: Diagnosis present

## 2023-01-08 DIAGNOSIS — E66811 Obesity, class 1: Secondary | ICD-10-CM | POA: Diagnosis present

## 2023-01-08 DIAGNOSIS — I4891 Unspecified atrial fibrillation: Secondary | ICD-10-CM | POA: Diagnosis present

## 2023-01-08 DIAGNOSIS — Z86718 Personal history of other venous thrombosis and embolism: Secondary | ICD-10-CM

## 2023-01-08 DIAGNOSIS — Z8349 Family history of other endocrine, nutritional and metabolic diseases: Secondary | ICD-10-CM

## 2023-01-08 DIAGNOSIS — R0902 Hypoxemia: Secondary | ICD-10-CM

## 2023-01-08 DIAGNOSIS — I825Y3 Chronic embolism and thrombosis of unspecified deep veins of proximal lower extremity, bilateral: Secondary | ICD-10-CM

## 2023-01-08 DIAGNOSIS — Z833 Family history of diabetes mellitus: Secondary | ICD-10-CM

## 2023-01-08 DIAGNOSIS — Z8249 Family history of ischemic heart disease and other diseases of the circulatory system: Secondary | ICD-10-CM

## 2023-01-08 DIAGNOSIS — E1122 Type 2 diabetes mellitus with diabetic chronic kidney disease: Secondary | ICD-10-CM | POA: Diagnosis not present

## 2023-01-08 DIAGNOSIS — Z800 Family history of malignant neoplasm of digestive organs: Secondary | ICD-10-CM

## 2023-01-08 DIAGNOSIS — D472 Monoclonal gammopathy: Secondary | ICD-10-CM | POA: Diagnosis present

## 2023-01-08 DIAGNOSIS — K5900 Constipation, unspecified: Secondary | ICD-10-CM | POA: Diagnosis present

## 2023-01-08 DIAGNOSIS — Z87891 Personal history of nicotine dependence: Secondary | ICD-10-CM

## 2023-01-08 DIAGNOSIS — M797 Fibromyalgia: Secondary | ICD-10-CM | POA: Diagnosis present

## 2023-01-08 DIAGNOSIS — R059 Cough, unspecified: Secondary | ICD-10-CM | POA: Diagnosis not present

## 2023-01-08 DIAGNOSIS — N183 Chronic kidney disease, stage 3 unspecified: Secondary | ICD-10-CM | POA: Diagnosis present

## 2023-01-08 DIAGNOSIS — I130 Hypertensive heart and chronic kidney disease with heart failure and stage 1 through stage 4 chronic kidney disease, or unspecified chronic kidney disease: Secondary | ICD-10-CM | POA: Diagnosis present

## 2023-01-08 DIAGNOSIS — Z79899 Other long term (current) drug therapy: Secondary | ICD-10-CM

## 2023-01-08 DIAGNOSIS — K219 Gastro-esophageal reflux disease without esophagitis: Secondary | ICD-10-CM | POA: Diagnosis present

## 2023-01-08 DIAGNOSIS — R7989 Other specified abnormal findings of blood chemistry: Secondary | ICD-10-CM | POA: Diagnosis present

## 2023-01-08 DIAGNOSIS — J449 Chronic obstructive pulmonary disease, unspecified: Secondary | ICD-10-CM | POA: Diagnosis present

## 2023-01-08 DIAGNOSIS — E039 Hypothyroidism, unspecified: Secondary | ICD-10-CM | POA: Diagnosis present

## 2023-01-08 DIAGNOSIS — U071 COVID-19: Principal | ICD-10-CM | POA: Diagnosis present

## 2023-01-08 DIAGNOSIS — Z825 Family history of asthma and other chronic lower respiratory diseases: Secondary | ICD-10-CM

## 2023-01-08 DIAGNOSIS — Z7985 Long-term (current) use of injectable non-insulin antidiabetic drugs: Secondary | ICD-10-CM

## 2023-01-08 DIAGNOSIS — N1831 Chronic kidney disease, stage 3a: Secondary | ICD-10-CM | POA: Diagnosis present

## 2023-01-08 DIAGNOSIS — Z8261 Family history of arthritis: Secondary | ICD-10-CM

## 2023-01-08 DIAGNOSIS — E1165 Type 2 diabetes mellitus with hyperglycemia: Secondary | ICD-10-CM | POA: Diagnosis present

## 2023-01-08 DIAGNOSIS — Z8616 Personal history of COVID-19: Secondary | ICD-10-CM

## 2023-01-08 LAB — URINALYSIS, ROUTINE W REFLEX MICROSCOPIC
Bacteria, UA: NONE SEEN
Bilirubin Urine: NEGATIVE
Glucose, UA: 50 mg/dL — AB
Ketones, ur: NEGATIVE mg/dL
Leukocytes,Ua: NEGATIVE
Nitrite: NEGATIVE
Protein, ur: >=300 mg/dL — AB
Specific Gravity, Urine: 1.015 (ref 1.005–1.030)
pH: 6 (ref 5.0–8.0)

## 2023-01-08 LAB — COMPREHENSIVE METABOLIC PANEL
ALT: 22 U/L (ref 0–44)
AST: 25 U/L (ref 15–41)
Albumin: 2.7 g/dL — ABNORMAL LOW (ref 3.5–5.0)
Alkaline Phosphatase: 193 U/L — ABNORMAL HIGH (ref 38–126)
Anion gap: 7 (ref 5–15)
BUN: 13 mg/dL (ref 8–23)
CO2: 24 mmol/L (ref 22–32)
Calcium: 8.6 mg/dL — ABNORMAL LOW (ref 8.9–10.3)
Chloride: 105 mmol/L (ref 98–111)
Creatinine, Ser: 1.01 mg/dL — ABNORMAL HIGH (ref 0.44–1.00)
GFR, Estimated: 59 mL/min — ABNORMAL LOW (ref >60)
Glucose, Bld: 212 mg/dL — ABNORMAL HIGH (ref 70–99)
Potassium: 3.9 mmol/L (ref 3.5–5.1)
Sodium: 136 mmol/L (ref 135–145)
Total Bilirubin: 0.9 mg/dL (ref <1.2)
Total Protein: 8.1 g/dL (ref 6.5–8.1)

## 2023-01-08 LAB — CBC
HCT: 38.6 % (ref 36.0–46.0)
Hemoglobin: 12.2 g/dL (ref 12.0–15.0)
MCH: 26.1 pg (ref 26.0–34.0)
MCHC: 31.6 g/dL (ref 30.0–36.0)
MCV: 82.5 fL (ref 80.0–100.0)
Platelets: 239 10*3/uL (ref 150–400)
RBC: 4.68 MIL/uL (ref 3.87–5.11)
RDW: 14 % (ref 11.5–15.5)
WBC: 10.9 10*3/uL — ABNORMAL HIGH (ref 4.0–10.5)
nRBC: 0 % (ref 0.0–0.2)

## 2023-01-08 LAB — RESP PANEL BY RT-PCR (RSV, FLU A&B, COVID)  RVPGX2
Influenza A by PCR: NEGATIVE
Influenza B by PCR: NEGATIVE
Resp Syncytial Virus by PCR: NEGATIVE
SARS Coronavirus 2 by RT PCR: POSITIVE — AB

## 2023-01-08 LAB — LACTIC ACID, PLASMA: Lactic Acid, Venous: 1.3 mmol/L (ref 0.5–1.9)

## 2023-01-08 LAB — TROPONIN I (HIGH SENSITIVITY)
Troponin I (High Sensitivity): 42 ng/L — ABNORMAL HIGH (ref <18)
Troponin I (High Sensitivity): 44 ng/L — ABNORMAL HIGH (ref <18)

## 2023-01-08 LAB — BRAIN NATRIURETIC PEPTIDE: B Natriuretic Peptide: 1482 pg/mL — ABNORMAL HIGH (ref 0.0–100.0)

## 2023-01-08 LAB — LIPASE, BLOOD: Lipase: 22 U/L (ref 11–51)

## 2023-01-08 LAB — GLUCOSE, CAPILLARY: Glucose-Capillary: 223 mg/dL — ABNORMAL HIGH (ref 70–99)

## 2023-01-08 MED ORDER — PANTOPRAZOLE SODIUM 40 MG PO TBEC
40.0000 mg | DELAYED_RELEASE_TABLET | Freq: Every day | ORAL | Status: DC
  Administered 2023-01-09 – 2023-01-12 (×4): 40 mg via ORAL
  Filled 2023-01-08 (×4): qty 1

## 2023-01-08 MED ORDER — ACETAMINOPHEN 325 MG PO TABS
975.0000 mg | ORAL_TABLET | Freq: Once | ORAL | Status: AC
  Administered 2023-01-08: 975 mg via ORAL

## 2023-01-08 MED ORDER — ALBUTEROL SULFATE (2.5 MG/3ML) 0.083% IN NEBU: 2.5000 mg | INHALATION_SOLUTION | RESPIRATORY_TRACT | Status: DC | PRN

## 2023-01-08 MED ORDER — MORPHINE SULFATE (PF) 4 MG/ML IV SOLN
4.0000 mg | Freq: Once | INTRAVENOUS | Status: AC
Start: 2023-01-08 — End: 2023-01-08
  Administered 2023-01-08: 4 mg via INTRAVENOUS
  Filled 2023-01-08: qty 1

## 2023-01-08 MED ORDER — ACETAMINOPHEN 650 MG RE SUPP: 650.0000 mg | Freq: Four times a day (QID) | RECTAL | Status: DC | PRN

## 2023-01-08 MED ORDER — INSULIN ASPART 100 UNIT/ML IJ SOLN
0.0000 [IU] | Freq: Every day | INTRAMUSCULAR | Status: DC
  Administered 2023-01-08: 2 [IU] via SUBCUTANEOUS
  Administered 2023-01-09: 3 [IU] via SUBCUTANEOUS

## 2023-01-08 MED ORDER — TRAZODONE HCL 50 MG PO TABS
150.0000 mg | ORAL_TABLET | Freq: Every day | ORAL | Status: DC
  Administered 2023-01-08 – 2023-01-11 (×4): 150 mg via ORAL
  Filled 2023-01-08 (×4): qty 3

## 2023-01-08 MED ORDER — SODIUM CHLORIDE 0.9 % IV BOLUS: 500.0000 mL | Freq: Once | INTRAVENOUS | Status: DC

## 2023-01-08 MED ORDER — SODIUM CHLORIDE 0.9% FLUSH
3.0000 mL | Freq: Two times a day (BID) | INTRAVENOUS | Status: DC
  Administered 2023-01-08 – 2023-01-11 (×6): 3 mL via INTRAVENOUS

## 2023-01-08 MED ORDER — INSULIN GLARGINE-YFGN 100 UNIT/ML ~~LOC~~ SOLN
12.0000 [IU] | Freq: Every day | SUBCUTANEOUS | Status: DC
  Administered 2023-01-08 – 2023-01-09 (×2): 12 [IU] via SUBCUTANEOUS
  Filled 2023-01-08 (×3): qty 0.12

## 2023-01-08 MED ORDER — DULOXETINE HCL 60 MG PO CPEP
60.0000 mg | ORAL_CAPSULE | Freq: Two times a day (BID) | ORAL | Status: DC
  Administered 2023-01-08 – 2023-01-12 (×8): 60 mg via ORAL
  Filled 2023-01-08 (×8): qty 1

## 2023-01-08 MED ORDER — ACETAMINOPHEN 325 MG PO TABS
650.0000 mg | ORAL_TABLET | Freq: Four times a day (QID) | ORAL | Status: DC | PRN
  Administered 2023-01-09: 650 mg via ORAL
  Filled 2023-01-08 (×2): qty 2

## 2023-01-08 MED ORDER — IRBESARTAN 150 MG PO TABS
150.0000 mg | ORAL_TABLET | Freq: Every day | ORAL | Status: DC
  Administered 2023-01-09 – 2023-01-12 (×4): 150 mg via ORAL
  Filled 2023-01-08 (×4): qty 1

## 2023-01-08 MED ORDER — HYDROMORPHONE HCL 1 MG/ML IJ SOLN
1.0000 mg | Freq: Once | INTRAMUSCULAR | Status: AC
  Administered 2023-01-08: 1 mg via INTRAVENOUS
  Filled 2023-01-08: qty 1

## 2023-01-08 MED ORDER — APIXABAN 5 MG PO TABS
5.0000 mg | ORAL_TABLET | Freq: Two times a day (BID) | ORAL | Status: DC
  Administered 2023-01-08 – 2023-01-12 (×8): 5 mg via ORAL
  Filled 2023-01-08 (×8): qty 1

## 2023-01-08 MED ORDER — IOHEXOL 300 MG/ML  SOLN
75.0000 mL | Freq: Once | INTRAMUSCULAR | Status: AC | PRN
  Administered 2023-01-08: 75 mL via INTRAVENOUS

## 2023-01-08 MED ORDER — INSULIN ASPART 100 UNIT/ML IJ SOLN
0.0000 [IU] | Freq: Three times a day (TID) | INTRAMUSCULAR | Status: DC
Start: 2023-01-09 — End: 2023-01-10
  Administered 2023-01-09: 1 [IU] via SUBCUTANEOUS
  Administered 2023-01-09: 2 [IU] via SUBCUTANEOUS
  Administered 2023-01-09: 1 [IU] via SUBCUTANEOUS
  Administered 2023-01-10 (×2): 5 [IU] via SUBCUTANEOUS

## 2023-01-08 MED ORDER — FUROSEMIDE 10 MG/ML IJ SOLN
40.0000 mg | Freq: Two times a day (BID) | INTRAMUSCULAR | Status: DC
  Administered 2023-01-08 – 2023-01-11 (×6): 40 mg via INTRAVENOUS
  Filled 2023-01-08 (×6): qty 4

## 2023-01-08 MED ORDER — ATORVASTATIN CALCIUM 40 MG PO TABS
80.0000 mg | ORAL_TABLET | Freq: Every day | ORAL | Status: DC
  Administered 2023-01-08 – 2023-01-12 (×5): 80 mg via ORAL
  Filled 2023-01-08 (×5): qty 2

## 2023-01-08 MED ORDER — PREGABALIN 75 MG PO CAPS
75.0000 mg | ORAL_CAPSULE | Freq: Two times a day (BID) | ORAL | Status: DC
  Administered 2023-01-08 – 2023-01-12 (×8): 75 mg via ORAL
  Filled 2023-01-08 (×8): qty 1

## 2023-01-08 MED ORDER — ONDANSETRON HCL 4 MG/2ML IJ SOLN
4.0000 mg | Freq: Once | INTRAMUSCULAR | Status: AC
  Administered 2023-01-08: 4 mg via INTRAVENOUS
  Filled 2023-01-08: qty 2

## 2023-01-08 MED ORDER — HYDROMORPHONE HCL 1 MG/ML IJ SOLN
0.5000 mg | INTRAMUSCULAR | Status: DC | PRN
  Administered 2023-01-09: 0.5 mg via INTRAVENOUS
  Filled 2023-01-08: qty 1
  Filled 2023-01-08: qty 0.5

## 2023-01-08 MED ORDER — POLYETHYLENE GLYCOL 3350 17 G PO PACK: 17.0000 g | PACK | Freq: Every day | ORAL | Status: DC | PRN

## 2023-01-08 MED ORDER — ONDANSETRON HCL 4 MG PO TABS: 4.0000 mg | ORAL_TABLET | Freq: Four times a day (QID) | ORAL | Status: DC | PRN

## 2023-01-08 MED ORDER — OXYCODONE HCL 5 MG PO TABS
5.0000 mg | ORAL_TABLET | ORAL | Status: DC | PRN
  Administered 2023-01-08 – 2023-01-12 (×5): 5 mg via ORAL
  Filled 2023-01-08 (×5): qty 1

## 2023-01-08 MED ORDER — METHOCARBAMOL 500 MG PO TABS
500.0000 mg | ORAL_TABLET | Freq: Three times a day (TID) | ORAL | Status: DC | PRN
  Administered 2023-01-09 – 2023-01-11 (×3): 500 mg via ORAL
  Filled 2023-01-08 (×3): qty 1

## 2023-01-08 MED ORDER — GUAIFENESIN 100 MG/5ML PO LIQD
5.0000 mL | ORAL | Status: DC | PRN
  Administered 2023-01-08 – 2023-01-11 (×3): 5 mL via ORAL
  Filled 2023-01-08 (×3): qty 5

## 2023-01-08 MED ORDER — LEVOTHYROXINE SODIUM 100 MCG PO TABS
100.0000 ug | ORAL_TABLET | Freq: Every day | ORAL | Status: DC
Start: 2023-01-09 — End: 2023-01-12
  Administered 2023-01-09 – 2023-01-12 (×4): 100 ug via ORAL
  Filled 2023-01-08 (×5): qty 1

## 2023-01-08 MED ORDER — HYDROXYZINE HCL 25 MG PO TABS
25.0000 mg | ORAL_TABLET | Freq: Three times a day (TID) | ORAL | Status: DC | PRN
  Administered 2023-01-11: 25 mg via ORAL
  Filled 2023-01-08: qty 1

## 2023-01-08 MED ORDER — SODIUM CHLORIDE 0.9 % IV BOLUS: 1000.0000 mL | Freq: Once | INTRAVENOUS | Status: DC

## 2023-01-08 MED ORDER — ONDANSETRON HCL 4 MG/2ML IJ SOLN: 4.0000 mg | Freq: Four times a day (QID) | INTRAMUSCULAR | Status: DC | PRN

## 2023-01-08 NOTE — ED Notes (Signed)
Patient is being discharged from the Urgent Care and sent to the Emergency Department via private vehicle . Per Normajean Glasgow NP, patient is in need of higher level of care due to Right rib pain/SOB/ cough. Patient is aware and verbalizes understanding of plan of care. There were no vitals filed for this visit.

## 2023-01-08 NOTE — ED Provider Notes (Signed)
[place] EMERGENCY DEPARTMENT AT [facility] Provider Note   CSN: [id number] Arrival date & time: 01/08/23  1020     History  Chief Complaint  Patient presents with   Rib Injury   Flank Pain   Cough    Janera Brien Langenbach is a 72 y.o. female with past medical history of HLD, type 2 diabetes, severe persistent asthma, systolic and diastolic heart failure, A-fib, COPD, DVT (on Eliquis) presents emergency department for evaluation of right rib pain that radiates into her back, nonproductive cough, shortness of breath over the past week.  She reports that pain has worsened over the past 24 hours. She reports that she normally uses 2 L via nasal cannula at home as needed but has been using it daily for the past week due to dyspnea.  She denies fevers at home, traumatic injury, hemoptysis, pedal edema, urinary symptoms.  The history is provided by the patient. No language interpreter was used.  Flank Pain Associated symptoms include shortness of breath. Pertinent negatives include no chest pain, no abdominal pain and no headaches.  Cough Associated symptoms: shortness of breath   Associated symptoms: no chest pain, no chills, no fever, no headaches and no wheezing       Home Medications Prior to Admission medications   Medication Sig Start Date End Date Taking? Authorizing Provider  Accu-Chek FastClix Lancets MISC Apply topically. 03/15/20   [provider]  ACCU-CHEK GUIDE test strip 4 (four) times daily. 07/02/20   [provider]  acetaminophen (TYLENOL) 500 MG tablet Take 1,000 mg by mouth every 6 (six) hours as needed for moderate pain or headache.    [provider]  albuterol (PROVENTIL) (2.5 MG/3ML) 0.083% nebulizer solution Take 2.5 mg by nebulization every 6 (six) hours as needed for wheezing or shortness of breath.    [provider]  albuterol (VENTOLIN HFA) 108 (90 Base) MCG/ACT inhaler Inhale 1-2 puffs into the lungs every 6 (six)  hours as needed for wheezing or shortness of breath. 03/21/21   Particia Nearing, PA-C  Alcohol Swabs (ALCOHOL PADS) 70 % PADS SMARTSIG:Pledget(s) Topical 4 Times Daily 08/12/20   [provider]  apixaban (ELIQUIS) 5 MG TABS tablet Take 1 tablet (5 mg total) by mouth 2 (two) times daily. 05/21/21   Arrien, York Ram, MD  Ascorbic Acid (VITAMIN C) 1000 MG tablet Take 500 mg by mouth daily.    [provider]  atorvastatin (LIPITOR) 80 MG tablet Take 1 tablet (80 mg total) by mouth daily. 05/22/21 06/24/58  Arrien, York Ram, MD  bisoprolol (ZEBETA) 10 MG tablet TAKE 1 TABLET(10 MG) BY MOUTH IN THE MORNING AND AT BEDTIME 12/12/21   Strader, [place] M, PA-C  Blood Glucose Calibration (ACCU-CHEK GUIDE CONTROL) LIQD See admin instructions. 07/02/20   [provider]  Blood Glucose Monitoring Suppl (ACCU-CHEK GUIDE ME) w/Device KIT 4 (four) times daily. 07/02/20   [provider]  Cetirizine HCl 10 MG CAPS Take 10 mg by mouth daily.    [provider]  Cholecalciferol (VITAMIN D) 2000 units tablet Take 4,000 Units by mouth daily.     [provider]  cimetidine (TAGAMET) 200 MG tablet Take 0.5 tablets (100 mg total) by mouth daily as needed. Patient taking differently: Take 100 mg by mouth daily as needed (indigestion). 03/22/20   Vassie Loll, MD  CINNAMON PO Take 1-2 capsules by mouth 3 (three) times daily.    [provider]  clobetasol ointment (TEMOVATE) 0.05 %  Apply 1 application topically 3 (three) times daily as needed (imflammation). 06/20/15   [provider]  diphenhydrAMINE (BENADRYL) 25 mg capsule Take 50 mg by mouth every 4 (four) hours as needed for itching.    [provider]  DULoxetine (CYMBALTA) 60 MG capsule Take 60 mg by mouth 2 (two) times daily.    [provider]  EASY COMFORT PEN NEEDLES 31G X 5 MM MISC INJECT IN SULIN EVERY DAY AS DIRECTED 06/02/21   [provider]   empagliflozin (JARDIANCE) 10 MG TABS tablet Take 1 tablet (10 mg total) by mouth daily. 06/23/22   Dani Gobble, NP  fluticasone (VERAMYST) 27.5 MCG/SPRAY nasal spray Place 2 sprays into the nose daily.    [provider]  guaiFENesin (ROBITUSSIN) 100 MG/5ML SOLN Take 5 mLs (100 mg total) by mouth every 4 (four) hours as needed for cough or to loosen phlegm. 03/17/20   Sherryll Burger, Pratik D, DO  HUMALOG KWIKPEN 100 UNIT/ML KwikPen ADMINISTER 5 TO 11 UNITS UNDER THE SKIN THREE TIMES DAILY PER SLIDING SCALE 12/09/22   Dani Gobble, NP  hydrOXYzine (ATARAX/VISTARIL) 25 MG tablet Take 25 mg by mouth every 8 (eight) hours as needed for itching.  06/20/15   [provider]  insulin degludec (TRESIBA FLEXTOUCH) 100 UNIT/ML FlexTouch Pen Inject 20 Units into the skin at bedtime. 09/29/22   Dani Gobble, NP  ipratropium-albuterol (DUONEB) 0.5-2.5 (3) MG/3ML SOLN Take 3 mLs by nebulization every 6 (six) hours as needed (shortness of breath). 05/03/21   [provider]  Lancet Devices (EASY MINI EJECT LANCING DEVICE) MISC 4 (four) times daily. 07/02/20   [provider]  levothyroxine (SYNTHROID) 100 MCG tablet Take 1 tablet (100 mcg total) by mouth daily before breakfast. 09/29/22   Dani Gobble, NP  lidocaine (LIDODERM) 5 % Place 1 patch onto the skin daily. Remove & Discard patch within 12 hours or as directed by MD 01/29/22   Leath-Warren, Sadie Haber, NP  lubiprostone (AMITIZA) 24 MCG capsule Take 1 capsule (24 mcg total) by mouth 2 (two) times daily with a meal. 01/21/22   Tiffany Kocher, PA-C  LYRICA 75 MG capsule Take 75 mg by mouth 2 (two) times daily.  07/18/15   [provider]  methocarbamol (ROBAXIN) 500 MG tablet Take 1 tablet (500 mg total) by mouth 3 (three) times daily as needed. Patient taking differently: Take 500 mg by mouth 3 (three) times daily as needed for muscle spasms. 08/06/17   Beverely Low, MD  montelukast (SINGULAIR) 10 MG tablet  Take 10 mg by mouth at bedtime. 04/02/21   [provider]  pantoprazole (PROTONIX) 40 MG tablet Take 1 tablet (40 mg total) by mouth daily before breakfast. 01/21/22   Tiffany Kocher, PA-C  REPATHA SURECLICK 140 MG/ML SOAJ ADMINISTER 1 ML UNDER THE SKIN EVERY 14 DAYS 01/05/22   Micki Riley, MD  REPATHA SURECLICK 140 MG/ML SOAJ ADMINISTER 140MG  UNDER THE SKIN EVERY 14 DAYS 11/30/22   Micki Riley, MD  RESTASIS 0.05 % ophthalmic emulsion Place 1 drop into both eyes 2 (two) times daily. 08/21/20   [provider]  telmisartan (MICARDIS) 20 MG tablet Take 40 mg by mouth daily.    [provider]  tirzepatide Greggory Keen) 10 MG/0.5ML Pen Inject 10 mg into the skin once a week. 09/29/22   Dani Gobble, NP  torsemide (DEMADEX) 20 MG tablet Take 2 tablets (40 mg total) by mouth daily. 07/02/15  Standley Brooking, MD  traMADol (ULTRAM) 50 MG tablet Take 50 mg by mouth 3 (three) times daily as needed for moderate pain. 01/07/18   [provider]  traZODone (DESYREL) 150 MG tablet Take 150 mg by mouth at bedtime. 04/07/22   [provider]  ursodiol (ACTIGALL) 300 MG capsule Take 300 mg by mouth 2 (two) times daily.    [provider]  vitamin B-12 (CYANOCOBALAMIN) 1000 MCG tablet Take 1,000 mcg by mouth daily.    [provider]      Allergies    Tetracyclines & related, Banana, and Penicillins    Review of Systems   Review of Systems  Constitutional:  Negative for chills, fatigue and fever.  Respiratory:  Positive for cough and shortness of breath. Negative for chest tightness and wheezing.   Cardiovascular:  Negative for chest pain and palpitations.  Gastrointestinal:  Negative for abdominal pain, constipation, diarrhea, nausea and vomiting.  Genitourinary:  Positive for flank pain.  Neurological:  Negative for dizziness, seizures, weakness, light-headedness, numbness and headaches.    Physical Exam Updated Vital Signs BP (!)  166/93   Pulse 99   Temp 98.4 F (36.9 C) (Oral)   Resp 18   Ht 5\' 8"  (1.727 m)   Wt 101.2 kg   SpO2 98%   BMI 33.91 kg/m  Physical Exam Vitals and nursing note reviewed.  Constitutional:      General: She is not in acute distress.    Appearance: Normal appearance.  HENT:     Head: Normocephalic and atraumatic.  Eyes:     Conjunctiva/sclera: Conjunctivae normal.  Cardiovascular:     Rate and Rhythm: Normal rate.     Pulses: Normal pulses.  Pulmonary:     Breath sounds: No wheezing.     Comments: Tachypnea at 25bpm Decreased lung sounds on right with clear on left Chest:     Chest wall: No tenderness.  Abdominal:     General: Bowel sounds are normal. There is no distension.     Palpations: Abdomen is soft.     Tenderness: There is no abdominal tenderness. There is no right CVA tenderness or left CVA tenderness.  Musculoskeletal:        General: No deformity.     Right lower leg: No edema.     Left lower leg: No edema.     Comments: TTP of right lower thoracic cage that radiates into back  Skin:    General: Skin is warm.     Capillary Refill: Capillary refill takes less than 2 seconds.     Coloration: Skin is not jaundiced or pale.  Neurological:     Mental Status: She is alert and oriented to person, place, and time. Mental status is at baseline.     ED Results / Procedures / Treatments   Labs (all labs ordered are listed, but only abnormal results are displayed) Labs Reviewed  RESP PANEL BY RT-PCR (RSV, FLU A&B, COVID)  RVPGX2 - Abnormal; Notable for the following components:      Result Value   SARS Coronavirus 2 by RT PCR POSITIVE (*)    All other components within normal limits  COMPREHENSIVE METABOLIC PANEL - Abnormal; Notable for the following components:   Glucose, Bld 212 (*)    Creatinine, Ser 1.01 (*)    Calcium 8.6 (*)    Albumin 2.7 (*)    Alkaline Phosphatase 193 (*)    GFR, Estimated 59 (*)    All other  components within normal limits  CBC -  Abnormal; Notable for the following components:   WBC 10.9 (*)    All other components within normal limits  TROPONIN I (HIGH SENSITIVITY) - Abnormal; Notable for the following components:   Troponin I (High Sensitivity) 42 (*)    All other components within normal limits  TROPONIN I (HIGH SENSITIVITY) - Abnormal; Notable for the following components:   Troponin I (High Sensitivity) 44 (*)    All other components within normal limits  CULTURE, BLOOD (ROUTINE X 2)  CULTURE, BLOOD (ROUTINE X 2)  LIPASE, BLOOD  LACTIC ACID, PLASMA  URINALYSIS, ROUTINE W REFLEX MICROSCOPIC  BRAIN NATRIURETIC PEPTIDE    EKG None  Radiology CT Chest W Contrast  Result Date: 01/08/2023 CLINICAL DATA:  Chest wall pain, nontraumatic, infection or inflammation suspected, xray done. EXAM: CT CHEST WITH CONTRAST TECHNIQUE: Multidetector CT imaging of the chest was performed during intravenous contrast administration. RADIATION DOSE REDUCTION: This exam was performed according to the departmental dose-optimization program which includes automated exposure control, adjustment of the mA and/or kV according to patient size and/or use of iterative reconstruction technique. CONTRAST:  75mL OMNIPAQUE IOHEXOL 300 MG/ML  SOLN COMPARISON:  CT scan chest from 09/21/2022. FINDINGS: Cardiovascular: Normal cardiac size. No pericardial effusion. No aortic aneurysm. There are coronary artery calcifications, in keeping with coronary artery disease. There are also mild peripheral atherosclerotic vascular calcifications of thoracic aorta and its major branches. Mediastinum/Nodes: Enlarged and heterogeneous thyroid gland containing calcifications along the posterior aspect. These are not well evaluated on the current exam but appears essentially unchanged since the prior study. No solid / cystic mediastinal masses. The esophagus is nondistended precluding optimal assessment. There are few mildly prominent mediastinal and hilar lymph nodes,  which do not meet the size criteria for lymphadenopathy and appear grossly similar to the prior study, favoring benign etiology. No axillary lymphadenopathy by size criteria. Lungs/Pleura: The central tracheo-bronchial tree is patent. Small left and moderate right pleural effusion noted with associated compressive atelectatic changes in the right lung lower lobe. There are bilateral peripheral/subpleural reticulations/scarring noted. No mass, consolidation or pneumothorax. No pulmonary edema. No suspicious lung nodules. Upper Abdomen: Visualized upper abdominal viscera within normal limits. Musculoskeletal: The visualized soft tissues of the chest wall are grossly unremarkable. No suspicious osseous lesions. There are mild multilevel degenerative changes in the visualized spine. Right shoulder arthroplasty noted. IMPRESSION: *Small left and moderate right pleural effusion. There are bilateral peripheral/subpleural reticulations/scarring. No lung mass, consolidation or pneumothorax. No pulmonary edema. No suspicious lung nodule. *No acute rib fracture.  No vertebral compression deformity. *Multiple other nonacute observations, as described above. Aortic Atherosclerosis (ICD10-I70.0). Electronically Signed   By: Jules Schick M.D.   On: 01/08/2023 15:04   DG Ribs Unilateral W/Chest Right  Result Date: 01/08/2023 CLINICAL DATA:  72 year old female with right rib pain.  Tachypnea. EXAM: RIGHT RIBS AND CHEST - 3+ VIEW COMPARISON:  Chest CTA 09/21/2022 and earlier. FINDINGS: AP chest at 1128 hours. Chronic right shoulder arthroplasty. Stable mild cardiomegaly. Patchy new asymmetric perihilar opacity on the right. No pneumothorax or pleural effusion. This finding persists on the additional chest and rib radiographs. Background coarse pulmonary interstitial opacity appears stable. Four oblique views of the right ribs. No acute osseous abnormality identified. 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 Nonobstructed visible bowel gas. IMPRESSION: 1. New since August confluent right perihilar opacity, nonspecific query Pneumonia. No pleural effusion or pneumothorax. 2. No right rib fracture or acute osseous  abnormality identified. 3. Followup PA and lateral chest X-ray is recommended in 3-4 weeks following trial of antibiotic therapy to ensure resolution and exclude underlying malignancy. Electronically Signed   By: Odessa Fleming M.D.   On: 01/08/2023 12:28    Procedures Procedures    Medications Ordered in ED Medications  morphine (PF) 4 MG/ML injection 4 mg (4 mg Intravenous Given 01/08/23 1253)  ondansetron (ZOFRAN) injection 4 mg (4 mg Intravenous Given 01/08/23 1253)  iohexol (OMNIPAQUE) 300 MG/ML solution 75 mL (75 mLs Intravenous Contrast Given 01/08/23 1406)  HYDROmorphone (DILAUDID) injection 1 mg (1 mg Intravenous Given 01/08/23 1747)    ED Course/ Medical Decision Making/ A&P                                 Medical Decision Making Amount and/or Complexity of Data Reviewed Labs: ordered. Radiology: ordered.  Risk Prescription drug management.   Patient presents to the ED for concern of cough, SOB and R rib pain over the past week, this involves an extensive number of treatment options, and is a complaint that carries with it a high risk of complications and morbidity.  The differential diagnosis includes PNA, ACS, URI,    Co morbidities that complicate the patient evaluation  HLD, type 2 diabetes, severe persistent asthma, systolic and diastolic heart failure, A-fib, COPD, DVT (on Eliquis) PMHx of PNA 2/2 COVID   Additional history obtained:  Additional history obtained from Nursing, Outside Medical Records, and Past Admission   External records from outside source obtained and reviewed including RN triage note   Lab Tests:  I Ordered, and personally interpreted labs.  The pertinent results include:  COVID +. mild leukocytosis at 10.9   Imaging  Studies ordered:  I ordered imaging studies including CXR, CT chest w con  I independently visualized and interpreted imaging which showed R PNA I agree with the radiologist interpretation   Cardiac Monitoring:  The patient was maintained on a cardiac monitor.  I personally viewed and interpreted the cardiac monitored which showed an underlying rhythm of: Sinus tachycardia with no STE nor ischemia   Medicines ordered and prescription drug management:  I ordered medication including morphine, dilaudid, zofran, and NS  for pain, N/V  Reevaluation of the patient after these medicines showed that the patient improved I have reviewed the patients home medicines and have made adjustments as needed   Consultations Obtained:  I requested consultation with the hospitalist,  and discussed lab and imaging findings as well as pertinent plan - they accept pt for admission   Problem List / ED Course:  SOB Hypoxia   Reevaluation:  After the interventions noted above, I reevaluated the patient and found that they have :improved    Dispostion:  Upon evaluation, patient appears to be in distress secondary to right rib pain and dyspnea.  She is initially tachypneic at 25 but satting 98% room air.  She is afebrile.  See HPI.  PE significant for decreased lung sounds on right lobes with clear on left. No wheezing auscultated.  Lab work is significant for COVID-positive, mild leukocytosis at 10.9.  Mildly elevated troponin at 42 then 44 likely due to chronic heart failure and/or new concurrent COVID infection. 1st lactic acid WNL.  Provided pain management and antinausea meds improving patient's right lower thorax pain.  However, patient reports continued dyspnea at rest.  While ambulating patient, patient desatted to 90% on her 2  L nasal cannula that is supposed to be used as needed with significant tachypnea up to 30 breaths/min.  CT significant for small left and moderate right pleural  effusions.  Patient does not appear fluid overloaded on exam with no pedal edema bilaterally. She reports improvement of pain but continues to feel SOB at rest. She maintains O2 sats with 2L O2.  After consideration of the diagnostic results and the patients response to treatment, I feel that the patent would benefit from admission for hypoxia in setting of COVID infection. Dr. Antionette Char accepts pt for admission.  Discussed pt with Dr. Charm Barges who individually assessed pt and agrees with plan.       Final Clinical Impression(s) / ED Diagnoses Final diagnoses:  SOB (shortness of breath)  Hypoxia    Rx / DC Orders ED Discharge Orders     None         Judithann Sheen, PA 01/08/23 1926    Terrilee Files, MD 01/10/23 [phone]

## 2023-01-08 NOTE — ED Triage Notes (Signed)
Pt to er, pt states that she has had a cough for the past two weeks, states that the pain started yesterday morning and got worse throughout the night, pt c/o R side pain, ribs and flank.  Pt states that the pain is worse with movement.  States that she went to urgent care and was sent to the er.

## 2023-01-08 NOTE — ED Provider Notes (Signed)
[facility]    CSN: [id number] Arrival date & time: 01/08/23  9629      History   Chief Complaint Chief Complaint  Patient presents with   Cough    HPI Stephanie [name] is a 72 y.o. female.   The history is provided by the patient.   Patient presents for complaints of cough, shortness of breath, and right-sided rib cage pain that is been present for the past week.  Patient states pain in the ribs worsened over the past 24 hours, pain worsens with movement, deep breathing, and coughing.  Patient also reports shortness of breath with exertion, and mild shortness of breath when at rest.  Patient reports she has used her albuterol nebulizer for symptoms.  Patient denies fever, chills, wheezing, difficulty breathing, chest pain, abdominal pain, nausea, vomiting, or diarrhea.  Past medical history significant for COPD, hypertension, diabetes, CHF, CVA, pneumonia, atrial fibrillation, and chronic kidney disease.  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   Type 2 diabetes mellitus (HCC)     Patient Active Problem List   Diagnosis Date Noted   Bilateral primary osteoarthritis of knee 09/15/2021   Acute on chronic combined systolic and diastolic CHF (congestive heart failure) (HCC) 05/20/2021   At risk for obstructive sleep apnea 05/20/2021   Acute CVA (cerebrovascular accident) (HCC) 05/17/2021   Protein-calorie malnutrition, moderate (HCC) 05/17/2021   Cerebral infarction, unspecified (HCC) 05/17/2021   NASH  (nonalcoholic steatohepatitis) 01/21/2021   Nanni (dyspnea on exertion) 06/18/2020   Chronic diastolic CHF (congestive heart failure) (HCC)    COVID-19 03/17/2020   Pneumonia due to COVID-19 virus 03/11/2020   Acute respiratory disease due to COVID-19 virus 03/11/2020   Abnormal LFTs 02/28/2018   TIA (transient ischemic attack) 09/15/2017   Localized osteoarthritis of right shoulder 08/08/2017   Acute embolism and thrombosis of unspecified deep veins of lower extremity, bilateral (HCC) 03/16/2017   Anemia of chronic renal failure 03/16/2017   Atrial fibrillation (HCC) 03/16/2017   Chronic kidney disease, stage 3 unspecified (HCC) 03/16/2017   Metabolic acidosis 03/16/2017   Dysphagia 12/16/2015   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   Constipation 10/27/2015   Diarrhea 08/02/2015   Elevated alkaline phosphatase level 07/23/2015   Heme positive stool 07/23/2015   Alkaline phosphatase raised 07/23/2015   Hypoglycemia 07/01/2015   Deep vein thrombosis (DVT) of both lower extremities (HCC) 06/27/2015   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   Maculopapular rash, generalized 06/27/2015   Alopecia 06/27/2015   Hypothyroidism 06/27/2015   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) 06/27/2015   Diverticulitis 10/19/2013   Tobacco abuse 10/19/2013   Obesity 10/19/2013   Diabetes (HCC) 10/19/2013   Hypertension    Anemia    GERD (gastroesophageal reflux disease)    COPD (chronic obstructive pulmonary disease) (HCC)     Past Surgical History:  Procedure Laterality Date   ABDOMINAL HYSTERECTOMY  partial   CARPAL TUNNEL RELEASE Right  1991   CATARACT EXTRACTION W/PHACO Right 05/08/2013   Procedure: CATARACT EXTRACTION PHACO AND INTRAOCULAR LENS PLACEMENT (IOC);  Surgeon: Gemma Payor, MD;  Location: [facility];  Service: Ophthalmology;  Laterality: Right;  CDE 10.31   CATARACT EXTRACTION W/PHACO Left 08/17/2013   Procedure: CATARACT EXTRACTION PHACO AND  INTRAOCULAR LENS PLACEMENT (IOC);  Surgeon: Gemma Payor, MD;  Location: [facility];  Service: Ophthalmology;  Laterality: Left;  CDE:9.03   CHOLECYSTECTOMY  1971   COLONOSCOPY WITH PROPOFOL N/A 01/06/2016   Dr. Jena Gauss: diverticulosis    DENTAL SURGERY     ESOPHAGEAL BRUSHING  08/29/2019   Procedure: ESOPHAGEAL BRUSHING;  Surgeon: Corbin Ade, MD;  Location: [facility];  Service: Endoscopy;;   ESOPHAGOGASTRODUODENOSCOPY (EGD) WITH PROPOFOL N/A 01/06/2016   Dr. Jena Gauss: normal s/p empiric dilation    ESOPHAGOGASTRODUODENOSCOPY (EGD) WITH PROPOFOL N/A 08/29/2019   esophageal plaques vs medication residue adherent to tubular esophagus s/p KOH brushing and dilation. Medium-sized hiatal hernia. + for candida. Diflucan.    EYE SURGERY     IR BONE MARROW BIOPSY & ASPIRATION  03/31/2022   MALONEY DILATION N/A 01/06/2016   Procedure: Elease Hashimoto DILATION;  Surgeon: Corbin Ade, MD;  Location: [facility];  Service: Endoscopy;  Laterality: N/A;   MALONEY DILATION N/A 08/29/2019   Procedure: Elease Hashimoto DILATION;  Surgeon: Corbin Ade, MD;  Location: [facility];  Service: Endoscopy;  Laterality: N/A;   REVERSE SHOULDER ARTHROPLASTY Right 08/06/2017   Procedure: RIGHT REVERSE SHOULDER ARTHROPLASTY;  Surgeon: Beverely Low, MD;  Location: [facility] OR;  Service: Orthopedics;  Laterality: Right;   WRIST GANGLION EXCISION Left     OB History   No obstetric history on file.      Home Medications    Prior to Admission medications   Medication Sig Start Date End Date Taking? Authorizing Provider  Accu-Chek FastClix Lancets MISC Apply topically. 03/15/20   [provider]  ACCU-CHEK GUIDE test strip 4 (four) times daily. 07/02/20   [provider]  acetaminophen (TYLENOL) 500 MG tablet Take 1,000 mg by mouth every 6 (six) hours as needed for moderate pain or headache.    [provider]  albuterol (PROVENTIL) (2.5 MG/3ML) 0.083% nebulizer solution Take 2.5 mg by nebulization every 6  (six) hours as needed for wheezing or shortness of breath.    [provider]  albuterol (VENTOLIN HFA) 108 (90 Base) MCG/ACT inhaler Inhale 1-2 puffs into the lungs every 6 (six) hours as needed for wheezing or shortness of breath. 03/21/21   Particia Nearing, PA-C  Alcohol Swabs (ALCOHOL PADS) 70 % PADS SMARTSIG:Pledget(s) Topical 4 Times Daily 08/12/20   [provider]  apixaban (ELIQUIS) 5 MG TABS tablet Take 1 tablet (5 mg total) by mouth 2 (two) times daily. 05/21/21   Arrien, York Ram, MD  Ascorbic Acid (VITAMIN C) 1000 MG tablet Take 500 mg by mouth daily.    [provider]  atorvastatin (LIPITOR) 80 MG tablet Take 1 tablet (80 mg total) by mouth daily. 05/22/21 06/24/58  Arrien, York Ram, MD  bisoprolol (ZEBETA) 10 MG tablet TAKE 1 TABLET(10 MG) BY MOUTH IN THE MORNING AND AT BEDTIME 12/12/21   Strader, [place] M, PA-C  Blood Glucose Calibration (ACCU-CHEK GUIDE CONTROL) LIQD See admin instructions. 07/02/20   [provider]  Blood Glucose Monitoring Suppl (ACCU-CHEK GUIDE ME) w/Device KIT 4 (four) times daily. 07/02/20   [provider]  Cetirizine HCl 10 MG CAPS Take 10 mg by mouth daily.  [provider]  Cholecalciferol (VITAMIN D) 2000 units tablet Take 4,000 Units by mouth daily.     [provider]  cimetidine (TAGAMET) 200 MG tablet Take 0.5 tablets (100 mg total) by mouth daily as needed. Patient taking differently: Take 100 mg by mouth daily as needed (indigestion). 03/22/20   Vassie Loll, MD  CINNAMON PO Take 1-2 capsules by mouth 3 (three) times daily.    [provider]  clobetasol ointment (TEMOVATE) 0.05 % Apply 1 application topically 3 (three) times daily as needed (imflammation). 06/20/15   [provider]  diphenhydrAMINE (BENADRYL) 25 mg capsule Take 50 mg by mouth every 4 (four) hours as needed for itching.    [provider]  DULoxetine (CYMBALTA) 60 MG capsule Take  60 mg by mouth 2 (two) times daily.    [provider]  EASY COMFORT PEN NEEDLES 31G X 5 MM MISC INJECT IN SULIN EVERY DAY AS DIRECTED 06/02/21   [provider]  empagliflozin (JARDIANCE) 10 MG TABS tablet Take 1 tablet (10 mg total) by mouth daily. 06/23/22   Dani Gobble, NP  fluticasone (VERAMYST) 27.5 MCG/SPRAY nasal spray Place 2 sprays into the nose daily.    [provider]  guaiFENesin (ROBITUSSIN) 100 MG/5ML SOLN Take 5 mLs (100 mg total) by mouth every 4 (four) hours as needed for cough or to loosen phlegm. 03/17/20   Sherryll Burger, Pratik D, DO  HUMALOG KWIKPEN 100 UNIT/ML KwikPen ADMINISTER 5 TO 11 UNITS UNDER THE SKIN THREE TIMES DAILY PER SLIDING SCALE 12/09/22   Dani Gobble, NP  hydrOXYzine (ATARAX/VISTARIL) 25 MG tablet Take 25 mg by mouth every 8 (eight) hours as needed for itching.  06/20/15   [provider]  insulin degludec (TRESIBA FLEXTOUCH) 100 UNIT/ML FlexTouch Pen Inject 20 Units into the skin at bedtime. 09/29/22   Dani Gobble, NP  ipratropium-albuterol (DUONEB) 0.5-2.5 (3) MG/3ML SOLN Take 3 mLs by nebulization every 6 (six) hours as needed (shortness of breath). 05/03/21   [provider]  Lancet Devices (EASY MINI EJECT LANCING DEVICE) MISC 4 (four) times daily. 07/02/20   [provider]  levothyroxine (SYNTHROID) 100 MCG tablet Take 1 tablet (100 mcg total) by mouth daily before breakfast. 09/29/22   Dani Gobble, NP  lidocaine (LIDODERM) 5 % Place 1 patch onto the skin daily. Remove & Discard patch within 12 hours or as directed by MD 01/29/22   Leath-Warren, Sadie Haber, NP  lubiprostone (AMITIZA) 24 MCG capsule Take 1 capsule (24 mcg total) by mouth 2 (two) times daily with a meal. 01/21/22   Tiffany Kocher, PA-C  LYRICA 75 MG capsule Take 75 mg by mouth 2 (two) times daily.  07/18/15   [provider]  methocarbamol (ROBAXIN) 500 MG tablet Take 1 tablet (500 mg total) by mouth 3 (three) times daily  as needed. Patient taking differently: Take 500 mg by mouth 3 (three) times daily as needed for muscle spasms. 08/06/17   Beverely Low, MD  montelukast (SINGULAIR) 10 MG tablet Take 10 mg by mouth at bedtime. 04/02/21   [provider]  pantoprazole (PROTONIX) 40 MG tablet Take 1 tablet (40 mg total) by mouth daily before breakfast. 01/21/22   Tiffany Kocher, PA-C  REPATHA SURECLICK 140 MG/ML SOAJ ADMINISTER 1 ML UNDER THE SKIN EVERY 14 DAYS 01/05/22   Micki Riley, MD  REPATHA SURECLICK 140 MG/ML SOAJ ADMINISTER 140MG  UNDER THE SKIN EVERY 14 DAYS 11/30/22   Delia Heady  S, MD  RESTASIS 0.05 % ophthalmic emulsion Place 1 drop into both eyes 2 (two) times daily. 08/21/20   [provider]  telmisartan (MICARDIS) 20 MG tablet Take 40 mg by mouth daily.    [provider]  tirzepatide Greggory Keen) 10 MG/0.5ML Pen Inject 10 mg into the skin once a week. 09/29/22   Dani Gobble, NP  torsemide (DEMADEX) 20 MG tablet Take 2 tablets (40 mg total) by mouth daily. 07/02/15   Standley Brooking, MD  traMADol (ULTRAM) 50 MG tablet Take 50 mg by mouth 3 (three) times daily as needed for moderate pain. 01/07/18   [provider]  traZODone (DESYREL) 150 MG tablet Take 150 mg by mouth at bedtime. 04/07/22   [provider]  ursodiol (ACTIGALL) 300 MG capsule Take 300 mg by mouth 2 (two) times daily.    [provider]  vitamin B-12 (CYANOCOBALAMIN) 1000 MCG tablet Take 1,000 mcg by mouth daily.    [provider]    Family History Family History  Problem Relation Age of Onset   Hypertension Mother    Diabetes Mother    COPD Mother    Arthritis Mother    Diabetes Father    Arthritis Father    Dementia Father    CAD Father    Hypothyroidism Sister    Diabetes Brother    Colon cancer Niece    Colon polyps Neg Hx    Sleep apnea Neg Hx     Social History Social History   Tobacco Use   Smoking status: Former    Current packs/day: 0.00     Average packs/day: 0.3 packs/day for 30.0 years (7.5 ttl pk-yrs)    Types: Cigarettes    Start date: 02/16/1981    Quit date: 02/17/2011    Years since quitting: 11.8   Smokeless tobacco: Never  Vaping Use   Vaping status: Never Used  Substance Use Topics   Alcohol use: Not Currently    Comment: rare   Drug use: No     Allergies   Tetracyclines & related, Banana, and Penicillins   Review of Systems Review of Systems Per HPI  Physical Exam Triage Vital Signs ED Triage Vitals  Encounter Vitals Group     BP 01/08/23 0936 (!) 183/74     Systolic BP Percentile --      Diastolic BP Percentile --      Pulse Rate 01/08/23 0936 (!) 115     Resp 01/08/23 0936 (!) 22     Temp 01/08/23 0936 98.3 F (36.8 C)     Temp Source 01/08/23 0936 Oral     SpO2 01/08/23 0936 95 %     Weight --      Height --      Head Circumference --      Peak Flow --      Pain Score 01/08/23 0937 10     Pain Loc --      Pain Education --      Exclude from Growth Chart --    No data found.  Updated Vital Signs BP (!) 183/74 (BP Location: Right Arm)   Pulse (!) 115   Temp 98.3 F (36.8 C) (Oral)   Resp (!) 22   SpO2 95%   Visual Acuity Right Eye Distance:   Left Eye Distance:   Bilateral Distance:    Right Eye Near:   Left Eye Near:    Bilateral Near:     Physical Exam  Vitals and nursing note reviewed.  Constitutional:      General: She is in acute distress (Due to pain in right rib cage).  HENT:     Head: Normocephalic.  Eyes:     Extraocular Movements: Extraocular movements intact.     Pupils: Pupils are equal, round, and reactive to light.  Cardiovascular:     Rate and Rhythm: Tachycardia present.     Pulses: Normal pulses.  Pulmonary:     Effort: Pulmonary effort is normal. No respiratory distress.     Breath sounds: Normal breath sounds. No stridor. No wheezing, rhonchi or rales.  Chest:     Chest wall: Tenderness present.    Abdominal:     General: Bowel sounds are  normal.     Palpations: Abdomen is soft.     Tenderness: There is no abdominal tenderness.  Musculoskeletal:     Cervical back: Normal range of motion.  Lymphadenopathy:     Cervical: No cervical adenopathy.  Skin:    General: Skin is warm and dry.  Neurological:     General: No focal deficit present.     Mental Status: She is alert and oriented to person, place, and time.  Psychiatric:        Mood and Affect: Mood normal.        Behavior: Behavior normal.      UC Treatments / Results  Labs (all labs ordered are listed, but only abnormal results are displayed) Labs Reviewed - No data to display  EKG   Radiology No results found.  Procedures Procedures (including critical care time)  Medications Ordered in UC Medications  acetaminophen (TYLENOL) tablet 975 mg (975 mg Oral Given 01/08/23 1011)    Initial Impression / Assessment and Plan / UC Course  I have reviewed the triage vital signs and the nursing notes.  Pertinent labs & imaging results that were available during my care of the patient were reviewed by me and considered in my medical decision making (see chart for details).  Patient presents with a 1 week history of shortness of breath and cough.  She also complains of right sided rib cage pain.  Shortness of breath is noted with exertion and when at rest.  Rib cage pain worsens with coughing, deep breathing, and movement.  On exam, room air sats are at 95%, patient was noted to have increased pain with deep breathing and cough.  Patient appeared moderately uncomfortable.  Past medical history is significant for COPD, chronic kidney disease, pneumonia, and atrial fibrillation.  Given patient's current presentation, and moderate pain in the right chest, patient and daughter were advised to go to the emergency department for further evaluation.  Patient's vital signs are mostly stable, patient is able to travel via private vehicle.  Patient was assisted to their vehicle  by use of wheelchair.  Patient and daughter verbalized understanding.  Final Clinical Impressions(s) / UC Diagnoses   Final diagnoses:  None     Discharge Instructions      Go to the emergency department for further evaluation.    ED Prescriptions   None    PDMP not reviewed this encounter.   Abran Cantor, NP 01/08/23 [phone]

## 2023-01-08 NOTE — ED Notes (Signed)
ED TO INPATIENT HANDOFF REPORT  ED Nurse Name and Phone #:   S Name/Age/Gender Stephanie [name] 72 y.o. female Room/Bed: APA03/APA03  Code Status   Code Status: Prior  Home/SNF/Other Home Patient oriented to: self, place, time, and situation Is this baseline? Yes   Triage Complete: Triage complete  Chief Complaint Right-sided chest pain [R07.9]  Triage Note Pt to er, pt states that she has had a cough for the past two weeks, states that the pain started yesterday morning and got worse throughout the night, pt c/o R side pain, ribs and flank.  Pt states that the pain is worse with movement.  States that she went to urgent care and was sent to the er.     Allergies Allergies  Allergen Reactions   Tetracyclines & Related Anaphylaxis and Rash   Banana Hives and Nausea And Vomiting   Penicillins Rash and Other (See Comments)    Has patient had a PCN reaction causing immediate rash, facial/tongue/throat swelling, SOB or lightheadedness with hypotension: No Has patient had a PCN reaction causing severe rash involving mucus membranes or skin necrosis: No Has patient had a PCN reaction that required hospitalization No Has patient had a PCN reaction occurring within the last 10 years: No If all of the above answers are "NO", then may proceed with Cephalosporin use.     Level of Care/Admitting Diagnosis ED Disposition     ED Disposition  Admit   Condition  --   Comment  Hospital Area: [facility] [[id number]]  Level of Care: Telemetry [5]  Covid Evaluation: Confirmed COVID Positive  Diagnosis: Right-sided chest pain [[id number]]  Admitting Physician: Briscoe Deutscher [[id number]]  Attending Physician: Briscoe Deutscher [[id number]]          B Medical/Surgery History Past Medical History:  Diagnosis Date   Anemia    Arthritis    Asthma    COPD (chronic obstructive pulmonary disease) (HCC)    COVID-19    Deep vein thrombosis (DVT) of both lower extremities (HCC) 06/27/2015    Fibromyalgia    GERD (gastroesophageal reflux disease)    H/O hiatal hernia    Hypercholesteremia    Hypertension    Hyperthyroidism    IBS (irritable bowel syndrome)    Inappropriate sinus tachycardia (HCC)    Inner ear disease    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   Type 2 diabetes mellitus (HCC)    Past Surgical History:  Procedure Laterality Date   ABDOMINAL HYSTERECTOMY  partial   CARPAL TUNNEL RELEASE Right 1991   CATARACT EXTRACTION W/PHACO Right 05/08/2013   Procedure: CATARACT EXTRACTION PHACO AND INTRAOCULAR LENS PLACEMENT (IOC);  Surgeon: Gemma Payor, MD;  Location: [facility];  Service: Ophthalmology;  Laterality: Right;  CDE 10.31   CATARACT EXTRACTION W/PHACO Left 08/17/2013   Procedure: CATARACT EXTRACTION PHACO AND INTRAOCULAR LENS PLACEMENT (IOC);  Surgeon: Gemma Payor, MD;  Location: [facility];  Service: Ophthalmology;  Laterality: Left;  CDE:9.03   CHOLECYSTECTOMY  1971   COLONOSCOPY WITH PROPOFOL N/A 01/06/2016   Dr. Jena Gauss: diverticulosis    DENTAL SURGERY     ESOPHAGEAL BRUSHING  08/29/2019   Procedure: ESOPHAGEAL BRUSHING;  Surgeon: Corbin Ade, MD;  Location: [facility];  Service: Endoscopy;;   ESOPHAGOGASTRODUODENOSCOPY (EGD) WITH PROPOFOL N/A 01/06/2016   Dr. Jena Gauss: normal s/p empiric dilation    ESOPHAGOGASTRODUODENOSCOPY (EGD) WITH PROPOFOL N/A 08/29/2019   esophageal plaques vs medication residue adherent to tubular esophagus s/p KOH brushing and dilation.  Medium-sized hiatal hernia. + for candida. Diflucan.    EYE SURGERY     IR BONE MARROW BIOPSY & ASPIRATION  03/31/2022   MALONEY DILATION N/A 01/06/2016   Procedure: Elease Hashimoto DILATION;  Surgeon: Corbin Ade, MD;  Location: [facility];  Service: Endoscopy;  Laterality: N/A;   MALONEY DILATION N/A 08/29/2019   Procedure: Elease Hashimoto DILATION;  Surgeon: Corbin Ade, MD;  Location: [facility];  Service: Endoscopy;  Laterality: N/A;   REVERSE SHOULDER ARTHROPLASTY Right  08/06/2017   Procedure: RIGHT REVERSE SHOULDER ARTHROPLASTY;  Surgeon: Beverely Low, MD;  Location: [facility] OR;  Service: Orthopedics;  Laterality: Right;   WRIST GANGLION EXCISION Left      A IV Location/Drains/Wounds Patient Lines/Drains/Airways Status     Active Line/Drains/Airways     Name Placement date Placement time Site Days   Peripheral IV 01/08/23 20 G Anterior;Left;Proximal Forearm 01/08/23  1227  Forearm  less than 1            Intake/Output Last 24 hours No intake or output data in the 24 hours ending 01/08/23 1929  Labs/Imaging Results for orders placed or performed during the hospital encounter of 01/08/23 (from the past 48 hour(s))  Resp panel by RT-PCR (RSV, Flu A&B, Covid) Anterior Nasal Swab     Status: Abnormal   Collection Time: 01/08/23 10:51 AM   Specimen: Anterior Nasal Swab  Result Value Ref Range   SARS Coronavirus 2 by RT PCR POSITIVE (A) NEGATIVE    Comment: (NOTE) SARS-CoV-2 target nucleic acids are DETECTED.  The SARS-CoV-2 RNA is generally detectable in upper respiratory specimens during the acute phase of infection. Positive results are indicative of the presence of the identified virus, but do not rule out bacterial infection or co-infection with other pathogens not detected by the test. Clinical correlation with patient history and other diagnostic information is necessary to determine patient infection status. The expected result is Negative.  Fact Sheet for Patients: BloggerCourse.com  Fact Sheet for Healthcare Providers: SeriousBroker.it  This test is not yet approved or cleared by the [place] FDA and  has been authorized for detection and/or diagnosis of SARS-CoV-2 by FDA under an Emergency Use Authorization (EUA).  This EUA will remain in effect (meaning this test can be used) for the duration of  the COVID-19 declaration under Section 564(b)(1) of the A ct, 21 U.S.C. section  360bbb-3(b)(1), unless the authorization is terminated or revoked sooner.     Influenza A by PCR NEGATIVE NEGATIVE   Influenza B by PCR NEGATIVE NEGATIVE    Comment: (NOTE) The Xpert Xpress SARS-CoV-2/FLU/RSV plus assay is intended as an aid in the diagnosis of influenza from Nasopharyngeal swab specimens and should not be used as a sole basis for treatment. Nasal washings and aspirates are unacceptable for Xpert Xpress SARS-CoV-2/FLU/RSV testing.  Fact Sheet for Patients: BloggerCourse.com  Fact Sheet for Healthcare Providers: SeriousBroker.it  This test is not yet approved or cleared by the [place] FDA and has been authorized for detection and/or diagnosis of SARS-CoV-2 by FDA under an Emergency Use Authorization (EUA). This EUA will remain in effect (meaning this test can be used) for the duration of the COVID-19 declaration under Section 564(b)(1) of the Act, 21 U.S.C. section 360bbb-3(b)(1), unless the authorization is terminated or revoked.     Resp Syncytial Virus by PCR NEGATIVE NEGATIVE    Comment: (NOTE) Fact Sheet for Patients: BloggerCourse.com  Fact Sheet for Healthcare Providers: SeriousBroker.it  This test is not yet approved  or cleared by the [place] and has been authorized for detection and/or diagnosis of SARS-CoV-2 by FDA under an Emergency Use Authorization (EUA). This EUA will remain in effect (meaning this test can be used) for the duration of the COVID-19 declaration under Section 564(b)(1) of the Act, 21 U.S.C. section 360bbb-3(b)(1), unless the authorization is terminated or revoked.  Performed at [facility], [address] [place], [place] [address]   Lipase, blood     Status: None   Collection Time: 01/08/23 11:22 AM  Result Value Ref Range   Lipase 22 11 - 51 U/L    Comment: Performed at [facility], [address]  [place], [place] [address]  Comprehensive metabolic panel     Status: Abnormal   Collection Time: 01/08/23 11:22 AM  Result Value Ref Range   Sodium 136 135 - 145 mmol/L   Potassium 3.9 3.5 - 5.1 mmol/L   Chloride 105 98 - 111 mmol/L   CO2 24 22 - 32 mmol/L   Glucose, Bld 212 (H) 70 - 99 mg/dL    Comment: Glucose reference range applies only to samples taken after fasting for at least 8 hours.   BUN 13 8 - 23 mg/dL   Creatinine, Ser 2.13 (H) 0.44 - 1.00 mg/dL   Calcium 8.6 (L) 8.9 - 10.3 mg/dL   Total Protein 8.1 6.5 - 8.1 g/dL   Albumin 2.7 (L) 3.5 - 5.0 g/dL   AST 25 15 - 41 U/L   ALT 22 0 - 44 U/L   Alkaline Phosphatase 193 (H) 38 - 126 U/L   Total Bilirubin 0.9 <1.2 mg/dL   GFR, Estimated 59 (L) >60 mL/min    Comment: (NOTE) Calculated using the CKD-EPI Creatinine Equation (2021)    Anion gap 7 5 - 15    Comment: Performed at [facility], [address] [place], [place] [address]  CBC     Status: Abnormal   Collection Time: 01/08/23 11:22 AM  Result Value Ref Range   WBC 10.9 (H) 4.0 - 10.5 K/uL   RBC 4.68 3.87 - 5.11 MIL/uL   Hemoglobin 12.2 12.0 - 15.0 g/dL   HCT 84.6 96.2 - 95.2 %   MCV 82.5 80.0 - 100.0 fL   MCH 26.1 26.0 - 34.0 pg   MCHC 31.6 30.0 - 36.0 g/dL   RDW 84.1 32.4 - 40.1 %   Platelets 239 150 - 400 K/uL   nRBC 0.0 0.0 - 0.2 %    Comment: Performed at [facility], [address], [place], [place] [address]  Troponin I (High Sensitivity)     Status: Abnormal   Collection Time: 01/08/23 11:22 AM  Result Value Ref Range   Troponin I (High Sensitivity) 42 (H) <18 ng/L    Comment: (NOTE) Elevated high sensitivity troponin I (hsTnI) values and significant  changes across serial measurements may suggest ACS but many other  chronic and acute conditions are known to elevate hsTnI results.  Refer to the "Links" section for chest pain algorithms and additional  guidance. Performed at [facility], [address] [place], [place] [address]   Troponin I (High  Sensitivity)     Status: Abnormal   Collection Time: 01/08/23  2:21 PM  Result Value Ref Range   Troponin I (High Sensitivity) 44 (H) <18 ng/L    Comment: (NOTE) Elevated high sensitivity troponin I (hsTnI) values and significant  changes across serial measurements may suggest ACS but many other  chronic and acute conditions  are known to elevate hsTnI results.  Refer to the "Links" section for chest pain algorithms and additional  guidance. Performed at [facility], [address] [place], [place] [address]   Lactic acid, plasma     Status: None   Collection Time: 01/08/23  3:18 PM  Result Value Ref Range   Lactic Acid, Venous 1.3 0.5 - 1.9 mmol/L    Comment: Performed at [facility], [address] [place], [place] [address]   CT Chest W Contrast  Result Date: 01/08/2023 CLINICAL DATA:  Chest wall pain, nontraumatic, infection or inflammation suspected, xray done. EXAM: CT CHEST WITH CONTRAST TECHNIQUE: Multidetector CT imaging of the chest was performed during intravenous contrast administration. RADIATION DOSE REDUCTION: This exam was performed according to the departmental dose-optimization program which includes automated exposure control, adjustment of the mA and/or kV according to patient size and/or use of iterative reconstruction technique. CONTRAST:  75mL OMNIPAQUE IOHEXOL 300 MG/ML  SOLN COMPARISON:  CT scan chest from 09/21/2022. FINDINGS: Cardiovascular: Normal cardiac size. No pericardial effusion. No aortic aneurysm. There are coronary artery calcifications, in keeping with coronary artery disease. There are also mild peripheral atherosclerotic vascular calcifications of thoracic aorta and its major branches. Mediastinum/Nodes: Enlarged and heterogeneous thyroid gland containing calcifications along the posterior aspect. These are not well evaluated on the current exam but appears essentially unchanged since the prior study. No solid / cystic mediastinal masses. The esophagus is  nondistended precluding optimal assessment. There are few mildly prominent mediastinal and hilar lymph nodes, which do not meet the size criteria for lymphadenopathy and appear grossly similar to the prior study, favoring benign etiology. No axillary lymphadenopathy by size criteria. Lungs/Pleura: The central tracheo-bronchial tree is patent. Small left and moderate right pleural effusion noted with associated compressive atelectatic changes in the right lung lower lobe. There are bilateral peripheral/subpleural reticulations/scarring noted. No mass, consolidation or pneumothorax. No pulmonary edema. No suspicious lung nodules. Upper Abdomen: Visualized upper abdominal viscera within normal limits. Musculoskeletal: The visualized soft tissues of the chest wall are grossly unremarkable. No suspicious osseous lesions. There are mild multilevel degenerative changes in the visualized spine. Right shoulder arthroplasty noted. IMPRESSION: *Small left and moderate right pleural effusion. There are bilateral peripheral/subpleural reticulations/scarring. No lung mass, consolidation or pneumothorax. No pulmonary edema. No suspicious lung nodule. *No acute rib fracture.  No vertebral compression deformity. *Multiple other nonacute observations, as described above. Aortic Atherosclerosis (ICD10-I70.0). Electronically Signed   By: Jules Schick M.D.   On: 01/08/2023 15:04   DG Ribs Unilateral W/Chest Right  Result Date: 01/08/2023 CLINICAL DATA:  72 year old female with right rib pain.  Tachypnea. EXAM: RIGHT RIBS AND CHEST - 3+ VIEW COMPARISON:  Chest CTA 09/21/2022 and earlier. FINDINGS: [facility] chest at 1128 hours. Chronic right shoulder arthroplasty. Stable mild cardiomegaly. Patchy new asymmetric perihilar opacity on the right. No pneumothorax or pleural effusion. This finding persists on the additional chest and rib radiographs. Background coarse pulmonary interstitial opacity appears stable. Four oblique views of the  right ribs. No acute osseous abnormality identified. 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 Nonobstructed visible bowel gas. IMPRESSION: 1. New since August confluent right perihilar opacity, nonspecific query Pneumonia. No pleural effusion or pneumothorax. 2. No right rib fracture or acute osseous abnormality identified. 3. Followup PA and lateral chest X-ray is recommended in 3-4 weeks following trial of antibiotic therapy to ensure resolution and exclude underlying malignancy. Electronically Signed   By: Odessa Fleming M.D.   On: 01/08/2023 12:28  Pending Labs Unresulted Labs (From admission, onward)     Start     Ordered   01/08/23 1908  Brain natriuretic peptide  Add-on,   AD        01/08/23 1907   01/08/23 1438  Blood culture (routine x 2)  BLOOD CULTURE X 2,   R (with STAT occurrences)      01/08/23 1438   01/08/23 1108  Urinalysis, Routine w reflex microscopic -Urine, Clean Catch  Once,   URGENT       Question:  Specimen Source  Answer:  Urine, Clean Catch   01/08/23 1107            Vitals/Pain Today's Vitals   01/08/23 1830 01/08/23 1845 01/08/23 1900 01/08/23 1915  BP:      Pulse: 99 99 99 99  Resp: 18 18 19 18   Temp:      TempSrc:      SpO2: 97% 96% 95% 98%  Weight:      Height:      PainSc:        Isolation Precautions No active isolations  Medications Medications  morphine (PF) 4 MG/ML injection 4 mg (4 mg Intravenous Given 01/08/23 1253)  ondansetron (ZOFRAN) injection 4 mg (4 mg Intravenous Given 01/08/23 1253)  iohexol (OMNIPAQUE) 300 MG/ML solution 75 mL (75 mLs Intravenous Contrast Given 01/08/23 1406)  HYDROmorphone (DILAUDID) injection 1 mg (1 mg Intravenous Given 01/08/23 1747)    Mobility walks     Focused Assessments    R Recommendations: See Admitting Provider Note  Report given to:   Additional Notes:

## 2023-01-08 NOTE — ED Notes (Signed)
See triage notes. Pt a/o. Pt c/o pain to right flank since yesterday, states worse with deep breathing and moving. Denies lifting anything heavy. Mild labored breathing noted. Color wnl. Able to complete sentences.

## 2023-01-08 NOTE — H&P (Signed)
History and Physical    ETHYL GAIN JYN:[id number] DOB: September 22, 1950 DOA: 01/08/2023  PCP: Marylynn Pearson, FNP   Patient coming from: Home   Chief Complaint: Right sided pain with breathing, SOB  HPI: Stephanie [name] is a 72 y.o. female with medical history significant for hypertension, insulin-dependent diabetes mellitus, CKD 3A, history of DVT on Eliquis, MGUS, and hypothyroidism who presents with right-sided pleuritic pain and dyspnea.  Patient states that she developed sinus congestion, rhinorrhea, and fatigue at the beginning of this month, thought that she may have contracted COVID, and states that the symptoms have improved.  While the URI symptoms have improved, she has become dyspneic over the past 2 weeks and then developed pleuritic pain on the right yesterday.  This pain has been severe whenever she takes a deep breath or coughs.  She reports adherence with Eliquis, does not have a gallbladder, and denies dysuria, hematuria, fever, or chills.  ED Course: Upon arrival to the ED, patient is found to be afebrile and saturating low 90s on room air with mild tachypnea, mild tachycardia, and elevated blood pressure.  EKG demonstrates sinus tachycardia and chest x-ray is concerning for right perihilar opacity.  CT chest with contrast reveals small left and moderate right pleural effusions.  Labs are most notable for glucose 213, creatinine 1.01, albumin 2.7, normal lactic acid, positive COVID-19 PCR, and troponin of 44.  Blood cultures were collected in the ED and the patient was treated with Dilaudid, morphine, and Zofran.  Review of Systems:  All other systems reviewed and apart from HPI, are negative.  Past Medical History:  Diagnosis Date   Anemia    Arthritis    Asthma    COPD (chronic obstructive pulmonary disease) (HCC)    COVID-19    Deep vein thrombosis (DVT) of both lower extremities (HCC) 06/27/2015   Fibromyalgia    GERD (gastroesophageal reflux disease)    H/O hiatal  hernia    Hypercholesteremia    Hypertension    Hyperthyroidism    IBS (irritable bowel syndrome)    Inappropriate sinus tachycardia (HCC)    Inner ear disease    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   Type 2 diabetes mellitus (HCC)     Past Surgical History:  Procedure Laterality Date   ABDOMINAL HYSTERECTOMY  partial   CARPAL TUNNEL RELEASE Right 1991   CATARACT EXTRACTION W/PHACO Right 05/08/2013   Procedure: CATARACT EXTRACTION PHACO AND INTRAOCULAR LENS PLACEMENT (IOC);  Surgeon: Gemma Payor, MD;  Location: [facility];  Service: Ophthalmology;  Laterality: Right;  CDE 10.31   CATARACT EXTRACTION W/PHACO Left 08/17/2013   Procedure: CATARACT EXTRACTION PHACO AND INTRAOCULAR LENS PLACEMENT (IOC);  Surgeon: Gemma Payor, MD;  Location: [facility];  Service: Ophthalmology;  Laterality: Left;  CDE:9.03   CHOLECYSTECTOMY  1971   COLONOSCOPY WITH PROPOFOL N/A 01/06/2016   Dr. Jena Gauss: diverticulosis    DENTAL SURGERY     ESOPHAGEAL BRUSHING  08/29/2019   Procedure: ESOPHAGEAL BRUSHING;  Surgeon: Corbin Ade, MD;  Location: [facility];  Service: Endoscopy;;   ESOPHAGOGASTRODUODENOSCOPY (EGD) WITH PROPOFOL N/A 01/06/2016   Dr. Jena Gauss: normal s/p empiric dilation    ESOPHAGOGASTRODUODENOSCOPY (EGD) WITH PROPOFOL N/A 08/29/2019   esophageal plaques vs medication residue adherent to tubular esophagus s/p KOH brushing and dilation. Medium-sized hiatal hernia. + for candida. Diflucan.    EYE SURGERY     IR BONE MARROW BIOPSY & ASPIRATION  03/31/2022   MALONEY DILATION N/A 01/06/2016   Procedure:  MALONEY DILATION;  Surgeon: Corbin Ade, MD;  Location: [facility];  Service: Endoscopy;  Laterality: N/A;   MALONEY DILATION N/A 08/29/2019   Procedure: Elease Hashimoto DILATION;  Surgeon: Corbin Ade, MD;  Location: [facility];  Service: Endoscopy;  Laterality: N/A;   REVERSE SHOULDER ARTHROPLASTY Right 08/06/2017   Procedure: RIGHT REVERSE SHOULDER ARTHROPLASTY;  Surgeon:  Beverely Low, MD;  Location: [facility] OR;  Service: Orthopedics;  Laterality: Right;   WRIST GANGLION EXCISION Left     Social History:   reports that she quit smoking about 11 years ago. Her smoking use included cigarettes. She started smoking about 41 years ago. She has a 7.5 pack-year smoking history. She has never used smokeless tobacco. She reports that she does not currently use alcohol. She reports that she does not use drugs.  Allergies  Allergen Reactions   Tetracyclines & Related Anaphylaxis and Rash   Banana Hives and Nausea And Vomiting   Penicillins Rash and Other (See Comments)    Family History  Problem Relation Age of Onset   Hypertension Mother    Diabetes Mother    COPD Mother    Arthritis Mother    Diabetes Father    Arthritis Father    Dementia Father    CAD Father    Hypothyroidism Sister    Diabetes Brother    Colon cancer Niece    Colon polyps Neg Hx    Sleep apnea Neg Hx      Prior to Admission medications   Medication Sig Start Date End Date Taking? Authorizing Provider  Accu-Chek FastClix Lancets MISC Apply topically. 03/15/20   [provider]  ACCU-CHEK GUIDE test strip 4 (four) times daily. 07/02/20   [provider]  acetaminophen (TYLENOL) 500 MG tablet Take 1,000 mg by mouth every 6 (six) hours as needed for moderate pain or headache.    [provider]  albuterol (PROVENTIL) (2.5 MG/3ML) 0.083% nebulizer solution Take 2.5 mg by nebulization every 6 (six) hours as needed for wheezing or shortness of breath.    [provider]  albuterol (VENTOLIN HFA) 108 (90 Base) MCG/ACT inhaler Inhale 1-2 puffs into the lungs every 6 (six) hours as needed for wheezing or shortness of breath. 03/21/21   Particia Nearing, PA-C  Alcohol Swabs (ALCOHOL PADS) 70 % PADS SMARTSIG:Pledget(s) Topical 4 Times Daily 08/12/20   [provider]  apixaban (ELIQUIS) 5 MG TABS tablet Take 1 tablet (5 mg total) by mouth 2 (two) times  daily. 05/21/21   Arrien, York Ram, MD  Ascorbic Acid (VITAMIN C) 1000 MG tablet Take 500 mg by mouth daily.    [provider]  atorvastatin (LIPITOR) 80 MG tablet Take 1 tablet (80 mg total) by mouth daily. 05/22/21 06/24/58  Arrien, York Ram, MD  bisoprolol (ZEBETA) 10 MG tablet TAKE 1 TABLET(10 MG) BY MOUTH IN THE MORNING AND AT BEDTIME 12/12/21   Strader, [place] M, PA-C  Blood Glucose Calibration (ACCU-CHEK GUIDE CONTROL) LIQD See admin instructions. 07/02/20   [provider]  Blood Glucose Monitoring Suppl (ACCU-CHEK GUIDE ME) w/Device KIT 4 (four) times daily. 07/02/20   [provider]  Cetirizine HCl 10 MG CAPS Take 10 mg by mouth daily.    [provider]  Cholecalciferol (VITAMIN D) 2000 units tablet Take 4,000 Units by mouth daily.     [provider]  cimetidine (TAGAMET) 200 MG tablet Take 0.5 tablets (100 mg total) by mouth daily as needed. Patient taking differently:  Take 100 mg by mouth daily as needed (indigestion). 03/22/20   Vassie Loll, MD  CINNAMON PO Take 1-2 capsules by mouth 3 (three) times daily.    [provider]  clobetasol ointment (TEMOVATE) 0.05 % Apply 1 application topically 3 (three) times daily as needed (imflammation). 06/20/15   [provider]  diphenhydrAMINE (BENADRYL) 25 mg capsule Take 50 mg by mouth every 4 (four) hours as needed for itching.    [provider]  DULoxetine (CYMBALTA) 60 MG capsule Take 60 mg by mouth 2 (two) times daily.    [provider]  EASY COMFORT PEN NEEDLES 31G X 5 MM MISC INJECT IN SULIN EVERY DAY AS DIRECTED 06/02/21   [provider]  empagliflozin (JARDIANCE) 10 MG TABS tablet Take 1 tablet (10 mg total) by mouth daily. 06/23/22   Dani Gobble, NP  fluticasone (FLONASE) 50 MCG/ACT nasal spray Place 2 sprays into both nostrils daily as needed for allergies. 12/31/22   [provider]  fluticasone (VERAMYST) 27.5  MCG/SPRAY nasal spray Place 2 sprays into the nose daily.    [provider]  guaiFENesin (ROBITUSSIN) 100 MG/5ML SOLN Take 5 mLs (100 mg total) by mouth every 4 (four) hours as needed for cough or to loosen phlegm. 03/17/20   Sherryll Burger, Pratik D, DO  HUMALOG KWIKPEN 100 UNIT/ML KwikPen ADMINISTER 5 TO 11 UNITS UNDER THE SKIN THREE TIMES DAILY PER SLIDING SCALE 12/09/22   Dani Gobble, NP  hydrOXYzine (ATARAX/VISTARIL) 25 MG tablet Take 25 mg by mouth every 8 (eight) hours as needed for itching.  06/20/15   [provider]  insulin degludec (TRESIBA FLEXTOUCH) 100 UNIT/ML FlexTouch Pen Inject 20 Units into the skin at bedtime. 09/29/22   Dani Gobble, NP  ipratropium-albuterol (DUONEB) 0.5-2.5 (3) MG/3ML SOLN Take 3 mLs by nebulization every 6 (six) hours as needed (shortness of breath). 05/03/21   [provider]  Lancet Devices (EASY MINI EJECT LANCING DEVICE) MISC 4 (four) times daily. 07/02/20   [provider]  levothyroxine (SYNTHROID) 100 MCG tablet Take 1 tablet (100 mcg total) by mouth daily before breakfast. 09/29/22   Dani Gobble, NP  lidocaine (LIDODERM) 5 % Place 1 patch onto the skin daily. Remove & Discard patch within 12 hours or as directed by MD 01/29/22   Leath-Warren, Sadie Haber, NP  lubiprostone (AMITIZA) 24 MCG capsule Take 1 capsule (24 mcg total) by mouth 2 (two) times daily with a meal. 01/21/22   Tiffany Kocher, PA-C  LYRICA 75 MG capsule Take 75 mg by mouth 2 (two) times daily.  07/18/15   [provider]  methocarbamol (ROBAXIN) 500 MG tablet Take 1 tablet (500 mg total) by mouth 3 (three) times daily as needed. Patient taking differently: Take 500 mg by mouth 3 (three) times daily as needed for muscle spasms. 08/06/17   Beverely Low, MD  montelukast (SINGULAIR) 10 MG tablet Take 10 mg by mouth at bedtime. 04/02/21   [provider]  pantoprazole (PROTONIX) 40 MG tablet Take 1 tablet (40 mg total) by mouth daily  before breakfast. 01/21/22   Tiffany Kocher, PA-C  REPATHA SURECLICK 140 MG/ML SOAJ ADMINISTER 1 ML UNDER THE SKIN EVERY 14 DAYS 01/05/22   Micki Riley, MD  REPATHA SURECLICK 140 MG/ML SOAJ ADMINISTER 140MG  UNDER THE SKIN EVERY 14 DAYS 11/30/22   Micki Riley, MD  RESTASIS 0.05 % ophthalmic emulsion Place 1 drop into both eyes 2 (two) times daily. 08/21/20  [provider]  telmisartan (MICARDIS) 20 MG tablet Take 40 mg by mouth daily.    [provider]  tirzepatide Greggory Keen) 10 MG/0.5ML Pen Inject 10 mg into the skin once a week. 09/29/22   Dani Gobble, NP  torsemide (DEMADEX) 20 MG tablet Take 2 tablets (40 mg total) by mouth daily. 07/02/15   Standley Brooking, MD  traMADol (ULTRAM) 50 MG tablet Take 50 mg by mouth 3 (three) times daily as needed for moderate pain. 01/07/18   [provider]  traZODone (DESYREL) 150 MG tablet Take 150 mg by mouth at bedtime. 04/07/22   [provider]  ursodiol (ACTIGALL) 300 MG capsule Take 300 mg by mouth 2 (two) times daily.    [provider]  vitamin B-12 (CYANOCOBALAMIN) 1000 MCG tablet Take 1,000 mcg by mouth daily.    [provider]    Physical Exam: Vitals:   01/08/23 1830 01/08/23 1845 01/08/23 1900 01/08/23 1915  BP:      Pulse: 99 99 99 99  Resp: 18 18 19 18   Temp:      TempSrc:      SpO2: 97% 96% 95% 98%  Weight:      Height:        Constitutional: NAD, calm  Eyes: PERTLA, lids and conjunctivae normal ENMT: Mucous membranes are moist. Posterior pharynx clear of any exudate or lesions.   Neck: supple, no masses  Respiratory: Diminished bilaterally, no wheezing. Speaking full sentences.    Cardiovascular: S1 & S2 heard, regular rate and rhythm. No Mild lower extremity edema b/l.  Abdomen: No distension, no tenderness, soft. Bowel sounds active.  Musculoskeletal: no clubbing / cyanosis. No joint deformity upper and lower extremities.   Skin: no significant rashes,  lesions, ulcers. Warm, dry, well-perfused. Neurologic: CN 2-12 grossly intact. Moving all extremities. Alert and oriented.  Psychiatric: Pleasant. Cooperative.    Labs and Imaging on Admission: I have personally reviewed following labs and imaging studies  CBC: Recent Labs  Lab 01/08/23 1122  WBC 10.9*  HGB 12.2  HCT 38.6  MCV 82.5  PLT 239   Basic Metabolic Panel: Recent Labs  Lab 01/08/23 1122  NA 136  K 3.9  CL 105  CO2 24  GLUCOSE 212*  BUN 13  CREATININE 1.01*  CALCIUM 8.6*   GFR: Estimated Creatinine Clearance: 62.6 mL/min (A) (by C-G formula based on SCr of 1.01 mg/dL (H)). Liver Function Tests: Recent Labs  Lab 01/08/23 1122  AST 25  ALT 22  ALKPHOS 193*  BILITOT 0.9  PROT 8.1  ALBUMIN 2.7*   Recent Labs  Lab 01/08/23 1122  LIPASE 22   No results for input(s): "AMMONIA" in the last 168 hours. Coagulation Profile: No results for input(s): "INR", "PROTIME" in the last 168 hours. Cardiac Enzymes: No results for input(s): "CKTOTAL", "CKMB", "CKMBINDEX", "TROPONINI" in the last 168 hours. BNP (last 3 results) No results for input(s): "PROBNP" in the last 8760 hours. HbA1C: No results for input(s): "HGBA1C" in the last 72 hours. CBG: No results for input(s): "GLUCAP" in the last 168 hours. Lipid Profile: No results for input(s): "CHOL", "HDL", "LDLCALC", "TRIG", "CHOLHDL", "LDLDIRECT" in the last 72 hours. Thyroid Function Tests: No results for input(s): "TSH", "T4TOTAL", "FREET4", "T3FREE", "THYROIDAB" in the last 72 hours. Anemia Panel: No results for input(s): "VITAMINB12", "FOLATE", "FERRITIN", "TIBC", "IRON", "RETICCTPCT" in the last 72 hours. Urine analysis:    Component Value Date/Time   COLORURINE YELLOW 05/17/2021 0550   APPEARANCEUR CLEAR  05/17/2021 0550   LABSPEC 1.006 05/17/2021 0550   PHURINE 6.0 05/17/2021 0550   GLUCOSEU 50 (A) 05/17/2021 0550   HGBUR SMALL (A) 05/17/2021 0550   BILIRUBINUR NEGATIVE 05/17/2021 0550    KETONESUR NEGATIVE 05/17/2021 0550   PROTEINUR 100 (A) 05/17/2021 0550   UROBILINOGEN 0.2 02/10/2014 1255   NITRITE NEGATIVE 05/17/2021 0550   LEUKOCYTESUR NEGATIVE 05/17/2021 0550   Sepsis Labs: @LABRCNTIP (procalcitonin:4,lacticidven:4) ) Recent Results (from the past 240 hour(s))  Resp panel by RT-PCR (RSV, Flu A&B, Covid) Anterior Nasal Swab     Status: Abnormal   Collection Time: 01/08/23 10:51 AM   Specimen: Anterior Nasal Swab  Result Value Ref Range Status   SARS Coronavirus 2 by RT PCR POSITIVE (A) NEGATIVE Final    Comment: (NOTE) SARS-CoV-2 target nucleic acids are DETECTED.  The SARS-CoV-2 RNA is generally detectable in upper respiratory specimens during the acute phase of infection. Positive results are indicative of the presence of the identified virus, but do not rule out bacterial infection or co-infection with other pathogens not detected by the test. Clinical correlation with patient history and other diagnostic information is necessary to determine patient infection status. The expected result is Negative.  Fact Sheet for Patients: BloggerCourse.com  Fact Sheet for Healthcare Providers: SeriousBroker.it  This test is not yet approved or cleared by the [place] FDA and  has been authorized for detection and/or diagnosis of SARS-CoV-2 by FDA under an Emergency Use Authorization (EUA).  This EUA will remain in effect (meaning this test can be used) for the duration of  the COVID-19 declaration under Section 564(b)(1) of the A ct, 21 U.S.C. section 360bbb-3(b)(1), unless the authorization is terminated or revoked sooner.     Influenza A by PCR NEGATIVE NEGATIVE Final   Influenza B by PCR NEGATIVE NEGATIVE Final    Comment: (NOTE) The Xpert Xpress SARS-CoV-2/FLU/RSV plus assay is intended as an aid in the diagnosis of influenza from Nasopharyngeal swab specimens and should not be used as a sole basis for  treatment. Nasal washings and aspirates are unacceptable for Xpert Xpress SARS-CoV-2/FLU/RSV testing.  Fact Sheet for Patients: BloggerCourse.com  Fact Sheet for Healthcare Providers: SeriousBroker.it  This test is not yet approved or cleared by the [place] FDA and has been authorized for detection and/or diagnosis of SARS-CoV-2 by FDA under an Emergency Use Authorization (EUA). This EUA will remain in effect (meaning this test can be used) for the duration of the COVID-19 declaration under Section 564(b)(1) of the Act, 21 U.S.C. section 360bbb-3(b)(1), unless the authorization is terminated or revoked.     Resp Syncytial Virus by PCR NEGATIVE NEGATIVE Final    Comment: (NOTE) Fact Sheet for Patients: BloggerCourse.com  Fact Sheet for Healthcare Providers: SeriousBroker.it  This test is not yet approved or cleared by the [place] FDA and has been authorized for detection and/or diagnosis of SARS-CoV-2 by FDA under an Emergency Use Authorization (EUA). This EUA will remain in effect (meaning this test can be used) for the duration of the COVID-19 declaration under Section 564(b)(1) of the Act, 21 U.S.C. section 360bbb-3(b)(1), unless the authorization is terminated or revoked.  Performed at [facility], [address], [place], [place] [address]      Radiological Exams on Admission: CT Chest W Contrast  Result Date: 01/08/2023 CLINICAL DATA:  Chest wall pain, nontraumatic, infection or inflammation suspected, xray done. EXAM: CT CHEST WITH CONTRAST TECHNIQUE: Multidetector CT imaging of the chest was performed during intravenous contrast administration. RADIATION DOSE REDUCTION:  This exam was performed according to the departmental dose-optimization program which includes automated exposure control, adjustment of the mA and/or kV according to patient size and/or use  of iterative reconstruction technique. CONTRAST:  75mL OMNIPAQUE IOHEXOL 300 MG/ML  SOLN COMPARISON:  CT scan chest from 09/21/2022. FINDINGS: Cardiovascular: Normal cardiac size. No pericardial effusion. No aortic aneurysm. There are coronary artery calcifications, in keeping with coronary artery disease. There are also mild peripheral atherosclerotic vascular calcifications of thoracic aorta and its major branches. Mediastinum/Nodes: Enlarged and heterogeneous thyroid gland containing calcifications along the posterior aspect. These are not well evaluated on the current exam but appears essentially unchanged since the prior study. No solid / cystic mediastinal masses. The esophagus is nondistended precluding optimal assessment. There are few mildly prominent mediastinal and hilar lymph nodes, which do not meet the size criteria for lymphadenopathy and appear grossly similar to the prior study, favoring benign etiology. No axillary lymphadenopathy by size criteria. Lungs/Pleura: The central tracheo-bronchial tree is patent. Small left and moderate right pleural effusion noted with associated compressive atelectatic changes in the right lung lower lobe. There are bilateral peripheral/subpleural reticulations/scarring noted. No mass, consolidation or pneumothorax. No pulmonary edema. No suspicious lung nodules. Upper Abdomen: Visualized upper abdominal viscera within normal limits. Musculoskeletal: The visualized soft tissues of the chest wall are grossly unremarkable. No suspicious osseous lesions. There are mild multilevel degenerative changes in the visualized spine. Right shoulder arthroplasty noted. IMPRESSION: *Small left and moderate right pleural effusion. There are bilateral peripheral/subpleural reticulations/scarring. No lung mass, consolidation or pneumothorax. No pulmonary edema. No suspicious lung nodule. *No acute rib fracture.  No vertebral compression deformity. *Multiple other nonacute observations,  as described above. Aortic Atherosclerosis (ICD10-I70.0). Electronically Signed   By: Jules Schick M.D.   On: 01/08/2023 15:04   DG Ribs Unilateral W/Chest Right  Result Date: 01/08/2023 CLINICAL DATA:  72 year old female with right rib pain.  Tachypnea. EXAM: RIGHT RIBS AND CHEST - 3+ VIEW COMPARISON:  Chest CTA 09/21/2022 and earlier. FINDINGS: [facility] chest at 1128 hours. Chronic right shoulder arthroplasty. Stable mild cardiomegaly. Patchy new asymmetric perihilar opacity on the right. No pneumothorax or pleural effusion. This finding persists on the additional chest and rib radiographs. Background coarse pulmonary interstitial opacity appears stable. Four oblique views of the right ribs. No acute osseous abnormality identified. 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 Nonobstructed visible bowel gas. IMPRESSION: 1. New since August confluent right perihilar opacity, nonspecific query Pneumonia. No pleural effusion or pneumothorax. 2. No right rib fracture or acute osseous abnormality identified. 3. Followup PA and lateral chest X-ray is recommended in 3-4 weeks following trial of antibiotic therapy to ensure resolution and exclude underlying malignancy. Electronically Signed   By: Odessa Fleming M.D.   On: 01/08/2023 12:28    EKG: Independently reviewed. Sinus tachycardia, rate 107.   Assessment/Plan   1. Acute on chronic HFpEF; pleural effusions  - Diurese with IV Lasix, monitor weight and I/Os, update echocardiogram, consider thoracentesis if effusions fail to respond to diuresis   2. COVID-19  - She had URI sxs at the beginning of November which have improved  - Supportive care    3. COPD; chronic hypoxic respiratory failure  - No wheezing on admission, does not appear to be in exacerbation  - Continue inhalers, supplemental O2    4. Elevated troponin  - Troponin mildly elevated and flat without anginal symptoms  - Continue Lipitor, follow-up echo findings     5. Insulin-dependent DM  -  A1c was 8.7% in August 2024  - Check CBGs and treat with long- and short-acting insulin   6. Hx of CVA  - Continue Lipitor and Eliquis   7. Hx of DVT - Continue Eliquis     DVT prophylaxis: Eliquis  Code Status: Full  Level of Care: Level of care: Telemetry Family Communication: none present  Disposition Plan:  Patient is from: home  Anticipated d/c is to: Home  Anticipated d/c date is: 11/23 o2 01/10/23  Patient currently: Pending pain-control, echocardiogram  Consults called: None  Admission status: Observation     Briscoe Deutscher, MD [facility]  01/08/2023, 7:45 PM

## 2023-01-08 NOTE — ED Notes (Signed)
Pt taken to xray 

## 2023-01-08 NOTE — Discharge Instructions (Signed)
Go to the emergency department for further evaluation

## 2023-01-08 NOTE — ED Triage Notes (Addendum)
Pt states coughing and SOB for the past week.  Worse yesterday. States her right rib area hurts when she moves and coughs.   States she used her nebulizer before she came in today. Pt SOB with exertion.

## 2023-01-08 NOTE — ED Notes (Signed)
02 2l [place] applied due to sats around 90% ra.

## 2023-01-09 ENCOUNTER — Other Ambulatory Visit (HOSPITAL_COMMUNITY): Payer: Self-pay | Admitting: *Deleted

## 2023-01-09 ENCOUNTER — Observation Stay (HOSPITAL_COMMUNITY): Payer: 59

## 2023-01-09 DIAGNOSIS — E039 Hypothyroidism, unspecified: Secondary | ICD-10-CM | POA: Diagnosis present

## 2023-01-09 DIAGNOSIS — K219 Gastro-esophageal reflux disease without esophagitis: Secondary | ICD-10-CM | POA: Diagnosis present

## 2023-01-09 DIAGNOSIS — I4891 Unspecified atrial fibrillation: Secondary | ICD-10-CM | POA: Diagnosis present

## 2023-01-09 DIAGNOSIS — R0602 Shortness of breath: Secondary | ICD-10-CM | POA: Diagnosis present

## 2023-01-09 DIAGNOSIS — Z8616 Personal history of COVID-19: Secondary | ICD-10-CM | POA: Diagnosis not present

## 2023-01-09 DIAGNOSIS — I5033 Acute on chronic diastolic (congestive) heart failure: Secondary | ICD-10-CM | POA: Diagnosis present

## 2023-01-09 DIAGNOSIS — I5031 Acute diastolic (congestive) heart failure: Secondary | ICD-10-CM

## 2023-01-09 DIAGNOSIS — M797 Fibromyalgia: Secondary | ICD-10-CM | POA: Diagnosis present

## 2023-01-09 DIAGNOSIS — K5900 Constipation, unspecified: Secondary | ICD-10-CM | POA: Diagnosis present

## 2023-01-09 DIAGNOSIS — Z6834 Body mass index (BMI) 34.0-34.9, adult: Secondary | ICD-10-CM | POA: Diagnosis not present

## 2023-01-09 DIAGNOSIS — U071 COVID-19: Secondary | ICD-10-CM | POA: Diagnosis present

## 2023-01-09 DIAGNOSIS — Z86718 Personal history of other venous thrombosis and embolism: Secondary | ICD-10-CM | POA: Diagnosis not present

## 2023-01-09 DIAGNOSIS — T380X5A Adverse effect of glucocorticoids and synthetic analogues, initial encounter: Secondary | ICD-10-CM | POA: Diagnosis present

## 2023-01-09 DIAGNOSIS — R7989 Other specified abnormal findings of blood chemistry: Secondary | ICD-10-CM | POA: Diagnosis not present

## 2023-01-09 DIAGNOSIS — D472 Monoclonal gammopathy: Secondary | ICD-10-CM | POA: Diagnosis present

## 2023-01-09 DIAGNOSIS — Z794 Long term (current) use of insulin: Secondary | ICD-10-CM | POA: Diagnosis not present

## 2023-01-09 DIAGNOSIS — I825Y3 Chronic embolism and thrombosis of unspecified deep veins of proximal lower extremity, bilateral: Secondary | ICD-10-CM | POA: Diagnosis not present

## 2023-01-09 DIAGNOSIS — I130 Hypertensive heart and chronic kidney disease with heart failure and stage 1 through stage 4 chronic kidney disease, or unspecified chronic kidney disease: Secondary | ICD-10-CM | POA: Diagnosis present

## 2023-01-09 DIAGNOSIS — J4550 Severe persistent asthma, uncomplicated: Secondary | ICD-10-CM | POA: Diagnosis present

## 2023-01-09 DIAGNOSIS — J9611 Chronic respiratory failure with hypoxia: Secondary | ICD-10-CM | POA: Diagnosis present

## 2023-01-09 DIAGNOSIS — E66811 Obesity, class 1: Secondary | ICD-10-CM | POA: Diagnosis present

## 2023-01-09 DIAGNOSIS — E7800 Pure hypercholesterolemia, unspecified: Secondary | ICD-10-CM | POA: Diagnosis present

## 2023-01-09 DIAGNOSIS — J4489 Other specified chronic obstructive pulmonary disease: Secondary | ICD-10-CM | POA: Diagnosis present

## 2023-01-09 DIAGNOSIS — E1165 Type 2 diabetes mellitus with hyperglycemia: Secondary | ICD-10-CM | POA: Diagnosis present

## 2023-01-09 DIAGNOSIS — Z7901 Long term (current) use of anticoagulants: Secondary | ICD-10-CM | POA: Diagnosis not present

## 2023-01-09 DIAGNOSIS — Z8249 Family history of ischemic heart disease and other diseases of the circulatory system: Secondary | ICD-10-CM | POA: Diagnosis not present

## 2023-01-09 DIAGNOSIS — N1831 Chronic kidney disease, stage 3a: Secondary | ICD-10-CM | POA: Diagnosis present

## 2023-01-09 DIAGNOSIS — E1122 Type 2 diabetes mellitus with diabetic chronic kidney disease: Secondary | ICD-10-CM | POA: Diagnosis present

## 2023-01-09 LAB — ECHOCARDIOGRAM COMPLETE
Area-P 1/2: 6.54 cm2
Height: 68 in
S' Lateral: 3.3 cm
Weight: 3573.22 [oz_av]

## 2023-01-09 LAB — CBC
HCT: 35.9 % — ABNORMAL LOW (ref 36.0–46.0)
Hemoglobin: 11.1 g/dL — ABNORMAL LOW (ref 12.0–15.0)
MCH: 26.4 pg (ref 26.0–34.0)
MCHC: 30.9 g/dL (ref 30.0–36.0)
MCV: 85.3 fL (ref 80.0–100.0)
Platelets: 205 10*3/uL (ref 150–400)
RBC: 4.21 MIL/uL (ref 3.87–5.11)
RDW: 14.3 % (ref 11.5–15.5)
WBC: 9.3 10*3/uL (ref 4.0–10.5)
nRBC: 0 % (ref 0.0–0.2)

## 2023-01-09 LAB — COMPREHENSIVE METABOLIC PANEL
ALT: 20 U/L (ref 0–44)
AST: 26 U/L (ref 15–41)
Albumin: 2.5 g/dL — ABNORMAL LOW (ref 3.5–5.0)
Alkaline Phosphatase: 171 U/L — ABNORMAL HIGH (ref 38–126)
Anion gap: 8 (ref 5–15)
BUN: 15 mg/dL (ref 8–23)
CO2: 25 mmol/L (ref 22–32)
Calcium: 8.4 mg/dL — ABNORMAL LOW (ref 8.9–10.3)
Chloride: 103 mmol/L (ref 98–111)
Creatinine, Ser: 1.28 mg/dL — ABNORMAL HIGH (ref 0.44–1.00)
GFR, Estimated: 45 mL/min — ABNORMAL LOW (ref >60)
Glucose, Bld: 207 mg/dL — ABNORMAL HIGH (ref 70–99)
Potassium: 4.5 mmol/L (ref 3.5–5.1)
Sodium: 136 mmol/L (ref 135–145)
Total Bilirubin: 1 mg/dL (ref <1.2)
Total Protein: 7.2 g/dL (ref 6.5–8.1)

## 2023-01-09 LAB — GLUCOSE, CAPILLARY
Glucose-Capillary: 198 mg/dL — ABNORMAL HIGH (ref 70–99)
Glucose-Capillary: 179 mg/dL — ABNORMAL HIGH (ref 70–99)
Glucose-Capillary: 172 mg/dL — ABNORMAL HIGH (ref 70–99)
Glucose-Capillary: 241 mg/dL — ABNORMAL HIGH (ref 70–99)
Glucose-Capillary: 259 mg/dL — ABNORMAL HIGH (ref 70–99)

## 2023-01-09 LAB — MAGNESIUM: Magnesium: 1.6 mg/dL — ABNORMAL LOW (ref 1.7–2.4)

## 2023-01-09 MED ORDER — MAGNESIUM SULFATE 2 GM/50ML IV SOLN
2.0000 g | Freq: Once | INTRAVENOUS | Status: AC
  Administered 2023-01-09: 2 g via INTRAVENOUS
  Filled 2023-01-09: qty 50

## 2023-01-09 MED ORDER — IPRATROPIUM-ALBUTEROL 20-100 MCG/ACT IN AERS
1.0000 | INHALATION_SPRAY | Freq: Four times a day (QID) | RESPIRATORY_TRACT | Status: DC
  Administered 2023-01-09 – 2023-01-12 (×11): 1 via RESPIRATORY_TRACT
  Filled 2023-01-09: qty 4

## 2023-01-09 MED ORDER — PERFLUTREN LIPID MICROSPHERE
1.0000 mL | INTRAVENOUS | Status: AC | PRN
  Administered 2023-01-09: 4 mL via INTRAVENOUS

## 2023-01-09 MED ORDER — PREDNISONE 20 MG PO TABS
50.0000 mg | ORAL_TABLET | Freq: Every day | ORAL | Status: DC
  Administered 2023-01-09: 50 mg via ORAL
  Filled 2023-01-09: qty 1

## 2023-01-09 NOTE — Progress Notes (Signed)
   01/09/23 0100  Assess: MEWS Score  Temp 98.5 F (36.9 C)  BP 121/77  MAP (mmHg) 89  Pulse Rate (!) 120  ECG Heart Rate (!) 133  Resp 19  Level of Consciousness Alert  SpO2 96 %  O2 Device Nasal Cannula  O2 Flow Rate (L/min) 2 L/min  Assess: MEWS Score  MEWS Temp 0  MEWS Systolic 0  MEWS Pulse 3  MEWS RR 0  MEWS LOC 0  MEWS Score 3  MEWS Score Color Yellow  Assess: if the MEWS score is Yellow or Red  Were vital signs accurate and taken at a resting state? Yes  Does the patient meet 2 or more of the SIRS criteria? No  MEWS guidelines implemented  Yes, yellow  Treat  MEWS Interventions Considered administering scheduled or prn medications/treatments as ordered  Take Vital Signs  Increase Vital Sign Frequency  Yellow: Q2hr x1, continue Q4hrs until patient remains green for 12hrs  Escalate  MEWS: Escalate Yellow: Discuss with charge nurse and consider notifying provider and/or RRT  Notify: Charge Nurse/RN  Name of Charge Nurse/RN Notified Latina RN  Provider Notification  Provider Name/Title Odie Sera  Date Provider Notified 01/09/23  Time Provider Notified 0115  Method of Notification Page (secure chat)  Notification Reason Other (Comment) (Yellow muse due to a HR ST in the 120's. Heart rate currently down to 113. Also oxygen sats was in the 80's on room air and had to be placed on 2L via nasal cannula and sats are now up to 96%.)  Provider response No new orders  Date of Provider Response 01/09/23  Time of Provider Response 0125  Assess: SIRS CRITERIA  SIRS Temperature  0  SIRS Pulse 1  SIRS Respirations  0  SIRS WBC 0  SIRS Score Sum  1

## 2023-01-09 NOTE — Progress Notes (Signed)
   01/09/23 1034  Assess: MEWS Score  Temp 98.3 F (36.8 C)  BP 135/68  MAP (mmHg) 88  Pulse Rate (!) 113  Resp 20  SpO2 97 %  O2 Device Nasal Cannula  Assess: MEWS Score  MEWS Temp 0  MEWS Systolic 0  MEWS Pulse 2  MEWS RR 0  MEWS LOC 0  MEWS Score 2  MEWS Score Color Yellow  Assess: if the MEWS score is Yellow or Red  Were vital signs accurate and taken at a resting state? Yes  Does the patient meet 2 or more of the SIRS criteria? No  MEWS guidelines implemented  No, previously yellow, continue vital signs every 4 hours  Notify: Charge Nurse/RN  Name of Charge Nurse/RN Notified Brandie RN  Provider Notification  Provider Name/Title Md Courage  Date Provider Notified 01/09/23  Time Provider Notified 1106  Assess: SIRS CRITERIA  SIRS Temperature  0  SIRS Pulse 1  SIRS Respirations  0  SIRS WBC 0  SIRS Score Sum  1

## 2023-01-09 NOTE — Progress Notes (Signed)
PROGRESS NOTE  Stephanie [name], is a 72 y.o. female, DOB - 12/10/1950, FAO:[id number]  Admit date - 01/08/2023   Admitting Physician Marge Vandermeulen Mariea Clonts, MD  Outpatient Primary MD for the patient is Marylynn Pearson, FNP  LOS - 0  Chief Complaint  Patient presents with   Rib Injury   Flank Pain   Cough     Brief Narrative:  72 y.o. female with medical history significant for hypertension, insulin-dependent diabetes mellitus, CKD 3A, history of DVT on Eliquis, MGUS, and hypothyroidism admitted with acute on chronic diastolic dysfunction/exacerbation in the setting of COVID-19 infection    -Assessment and Plan: 1) acute diastolic CHF exacerbation--echo from 01/09/2023 with EF of 50 to 55%, there is global hypokinesis of the left ventricle -Continue IV Lasix -Daily weight and fluid input and output monitoring  2) COVID-19 infection--dyspnea cough and respiratory symptoms noted, -Chronic hypoxic respiratory failure requiring oxygen -Prednisone and bronchodilators as prescribed  3)COPD--- prednisone and bronchodilators as above #1  4)DM2--C8.7 reflecting uncontrolled DM with hyperglycemia PTA -Anticipate worsening hyperglycemia with steroids -Continue Semglee Use Novolog/Humalog Sliding scale insulin with Accu-Cheks/Fingersticks as ordered   5)H/o prior DVT and CVA--- continue Eliquis -Continue atorvastatin for secondary stroke prophylaxis  6) hypothyroidism--- c/n  levothyroxine  7)HTN--continue Avapro  8) chronic hypoxic respiratory failure--- continue supplemental oxygen   Status is: Inpatient   Disposition: The patient is from: Home              Anticipated d/c is to: Home              Anticipated d/c date is: 2 days              Patient currently is not medically stable to d/c. Barriers: Not Clinically Stable-   Code Status :  -  Code Status: Full Code   Family Communication:    NA (patient is alert, awake and coherent)   DVT Prophylaxis  :   - SCDs    apixaban  (ELIQUIS) tablet 5 mg   Lab Results  Component Value Date   PLT 205 01/09/2023    Inpatient Medications  Scheduled Meds:  apixaban  5 mg Oral BID   atorvastatin  80 mg Oral Daily   DULoxetine  60 mg Oral BID   furosemide  40 mg Intravenous Q12H   insulin aspart  0-5 Units Subcutaneous QHS   insulin aspart  0-6 Units Subcutaneous TID WC   insulin glargine-yfgn  12 Units Subcutaneous QHS   irbesartan  150 mg Oral Daily   levothyroxine  100 mcg Oral QAC breakfast   pantoprazole  40 mg Oral QAC breakfast   predniSONE  50 mg Oral Q breakfast   pregabalin  75 mg Oral BID   sodium chloride flush  3 mL Intravenous Q12H   traZODone  150 mg Oral QHS   Continuous Infusions: PRN Meds:.acetaminophen **OR** acetaminophen, albuterol, guaiFENesin, HYDROmorphone (DILAUDID) injection, hydrOXYzine, methocarbamol, ondansetron **OR** ondansetron (ZOFRAN) IV, oxyCODONE, polyethylene glycol   Anti-infectives (From admission, onward)    None         Subjective: Inus Ose today has no fevers, no emesis,  No chest pain,   - Cough and dyspnea persist Objective: Vitals:   01/09/23 0645 01/09/23 1034 01/09/23 1133 01/09/23 1532  BP: 127/76 135/68 137/76 126/66  Pulse: (!) 107 (!) 113 (!) 109 (!) 108  Resp:  20    Temp: 98.4 F (36.9 C) 98.3 F (36.8 C) 98.5 F (36.9 C) 98.6 F (37 C)  TempSrc:  Oral Oral Oral Oral  SpO2: 95% 97% 92% 92%  Weight:      Height:        Intake/Output Summary (Last 24 hours) at 01/09/2023 1914 Last data filed at 01/09/2023 1858 Gross per 24 hour  Intake 720 ml  Output 450 ml  Net 270 ml   Filed Weights   01/08/23 1047 01/08/23 2033 01/09/23 0500  Weight: 101.2 kg 101.1 kg 101.3 kg    Physical Exam  Gen:- Awake Alert, dyspnea on exertion but no conversational dyspnea HEENT:- [place].AT, No sclera icterus Nose- [place] 3L/min Neck-Supple Neck,No JVD,.  Lungs-faint bibasilar Rales,, fair symmetrical air movement CV- S1, S2 normal, regular  Abd-  +ve  B.Sounds, Abd Soft, No tenderness,    Extremity/Skin:- No  edema, pedal pulses present  Psych-affect is appropriate, oriented x3 Neuro-no new focal deficits, no tremors  Data Reviewed: I have personally reviewed following labs and imaging studies  CBC: Recent Labs  Lab 01/08/23 1122 01/09/23 0500  WBC 10.9* 9.3  HGB 12.2 11.1*  HCT 38.6 35.9*  MCV 82.5 85.3  PLT 239 205   Basic Metabolic Panel: Recent Labs  Lab 01/08/23 1122 01/09/23 0500  NA 136 136  K 3.9 4.5  CL 105 103  CO2 24 25  GLUCOSE 212* 207*  BUN 13 15  CREATININE 1.01* 1.28*  CALCIUM 8.6* 8.4*  MG  --  1.6*   GFR: Estimated Creatinine Clearance: 49.5 mL/min (A) (by C-G formula based on SCr of 1.28 mg/dL (H)). Liver Function Tests: Recent Labs  Lab 01/08/23 1122 01/09/23 0500  AST 25 26  ALT 22 20  ALKPHOS 193* 171*  BILITOT 0.9 1.0  PROT 8.1 7.2  ALBUMIN 2.7* 2.5*   Recent Results (from the past 240 hour(s))  Resp panel by RT-PCR (RSV, Flu A&B, Covid) Anterior Nasal Swab     Status: Abnormal   Collection Time: 01/08/23 10:51 AM   Specimen: Anterior Nasal Swab  Result Value Ref Range Status   SARS Coronavirus 2 by RT PCR POSITIVE (A) NEGATIVE Final    Comment: (NOTE) SARS-CoV-2 target nucleic acids are DETECTED.  The SARS-CoV-2 RNA is generally detectable in upper respiratory specimens during the acute phase of infection. Positive results are indicative of the presence of the identified virus, but do not rule out bacterial infection or co-infection with other pathogens not detected by the test. Clinical correlation with patient history and other diagnostic information is necessary to determine patient infection status. The expected result is Negative.  Fact Sheet for Patients: BloggerCourse.com  Fact Sheet for Healthcare Providers: SeriousBroker.it  This test is not yet approved or cleared by the [place] FDA and  has been  authorized for detection and/or diagnosis of SARS-CoV-2 by FDA under an Emergency Use Authorization (EUA).  This EUA will remain in effect (meaning this test can be used) for the duration of  the COVID-19 declaration under Section 564(b)(1) of the A ct, 21 U.S.C. section 360bbb-3(b)(1), unless the authorization is terminated or revoked sooner.     Influenza A by PCR NEGATIVE NEGATIVE Final   Influenza B by PCR NEGATIVE NEGATIVE Final    Comment: (NOTE) The Xpert Xpress SARS-CoV-2/FLU/RSV plus assay is intended as an aid in the diagnosis of influenza from Nasopharyngeal swab specimens and should not be used as a sole basis for treatment. Nasal washings and aspirates are unacceptable for Xpert Xpress SARS-CoV-2/FLU/RSV testing.  Fact Sheet for Patients: BloggerCourse.com  Fact Sheet for Healthcare Providers: SeriousBroker.it  This test is  not yet approved or cleared by the [place] and has been authorized for detection and/or diagnosis of SARS-CoV-2 by FDA under an Emergency Use Authorization (EUA). This EUA will remain in effect (meaning this test can be used) for the duration of the COVID-19 declaration under Section 564(b)(1) of the Act, 21 U.S.C. section 360bbb-3(b)(1), unless the authorization is terminated or revoked.     Resp Syncytial Virus by PCR NEGATIVE NEGATIVE Final    Comment: (NOTE) Fact Sheet for Patients: BloggerCourse.com  Fact Sheet for Healthcare Providers: SeriousBroker.it  This test is not yet approved or cleared by the [place] FDA and has been authorized for detection and/or diagnosis of SARS-CoV-2 by FDA under an Emergency Use Authorization (EUA). This EUA will remain in effect (meaning this test can be used) for the duration of the COVID-19 declaration under Section 564(b)(1) of the Act, 21 U.S.C. section 360bbb-3(b)(1), unless the  authorization is terminated or revoked.  Performed at [facility], [address], [place], [place] [address]   Blood culture (routine x 2)     Status: None (Preliminary result)   Collection Time: 01/08/23  3:18 PM   Specimen: BLOOD  Result Value Ref Range Status   Specimen Description BLOOD BLOOD RIGHT ARM  Final   Special Requests NONE  Final   Culture   Final    NO GROWTH < 24 HOURS Performed at [facility], [address], [place], [place] [address]    Report Status PENDING  Incomplete  Blood culture (routine x 2)     Status: None (Preliminary result)   Collection Time: 01/08/23  3:18 PM   Specimen: BLOOD  Result Value Ref Range Status   Specimen Description BLOOD BLOOD RIGHT HAND  Final   Special Requests NONE  Final   Culture   Final    NO GROWTH < 24 HOURS Performed at [facility], [address] [place], [place] [address]    Report Status PENDING  Incomplete      Radiology Studies: ECHOCARDIOGRAM COMPLETE  Result Date: 01/09/2023    ECHOCARDIOGRAM REPORT   Patient Name:   LARUE DEFARIA Mcguirk Date of Exam: 01/09/2023 Medical Rec #:  [id number]   Height:       68.0 in Accession #:    [id number]  Weight:       223.3 lb Date of Birth:  [birth year]   BSA:          2.142 m Patient Age:    72 years    BP:           137/76 mmHg Patient Gender: F           HR:           110 bpm. [facility] Procedure: 2D Echo, Cardiac Doppler, Color Doppler and Intracardiac            Opacification Agent Indications:    CHF-Acute Diastolic I50.31                 Elevated Troponin  History:        Patient has prior history of Echocardiogram examinations, most                 recent 10/06/2021. CHF, COPD and TIA, Arrythmias:Atrial                 Fibrillation; Risk Factors:Hypertension and Diabetes.  Sonographer:    Celesta Gentile RCS Referring Phys: [id number] TIMOTHY S OPYD IMPRESSIONS  1.  Left ventricular ejection fraction, by estimation, is 50 to 55%. The left ventricle has low normal function.  The left ventricle demonstrates global hypokinesis. There is mild concentric left ventricular hypertrophy. Left ventricular diastolic parameters are indeterminate.  2. Right ventricular systolic function is normal. The right ventricular size is normal.  3. The mitral valve is normal in structure. No evidence of mitral valve regurgitation. No evidence of mitral stenosis.  4. The aortic valve is normal in structure. Aortic valve regurgitation is not visualized. No aortic stenosis is present.  5. The inferior vena cava is normal in size with greater than 50% respiratory variability, suggesting right atrial pressure of 3 mmHg. FINDINGS  Left Ventricle: Left ventricular ejection fraction, by estimation, is 50 to 55%. The left ventricle has low normal function. The left ventricle demonstrates global hypokinesis. Definity contrast agent was given IV to delineate the left ventricular endocardial borders. The left ventricular internal cavity size was normal in size. There is mild concentric left ventricular hypertrophy. Left ventricular diastolic parameters are indeterminate. Right Ventricle: The right ventricular size is normal. No increase in right ventricular wall thickness. Right ventricular systolic function is normal. Left Atrium: Left atrial size was normal in size. Right Atrium: Right atrial size was normal in size. Pericardium: There is no evidence of pericardial effusion. Presence of epicardial fat layer. Mitral Valve: The mitral valve is normal in structure. No evidence of mitral valve regurgitation. No evidence of mitral valve stenosis. Tricuspid Valve: The tricuspid valve is normal in structure. Tricuspid valve regurgitation is not demonstrated. No evidence of tricuspid stenosis. Aortic Valve: The aortic valve is normal in structure. Aortic valve regurgitation is not visualized. No aortic stenosis is present. Pulmonic Valve: The pulmonic valve was not well visualized. Pulmonic valve regurgitation is trivial. No  evidence of pulmonic stenosis. Aorta: The aortic root is normal in size and structure. Venous: The inferior vena cava is normal in size with greater than 50% respiratory variability, suggesting right atrial pressure of 3 mmHg. IAS/Shunts: No atrial level shunt detected by color flow Doppler.  LEFT VENTRICLE PLAX 2D LVIDd:         4.80 cm LVIDs:         3.30 cm LV PW:         1.00 cm LV IVS:        1.20 cm LVOT diam:     1.70 cm LV SV:         43 LV SV Index:   20 LVOT Area:     2.27 cm  RIGHT VENTRICLE RV S prime:     14.40 cm/s TAPSE (M-mode): 1.9 cm LEFT ATRIUM             Index        RIGHT ATRIUM           Index LA diam:        3.70 cm 1.73 cm/m   RA Area:     15.70 cm LA Vol (A2C):   65.5 ml 30.58 ml/m  RA Volume:   40.20 ml  18.77 ml/m LA Vol (A4C):   52.1 ml 24.32 ml/m LA Biplane Vol: 62.7 ml 29.27 ml/m  AORTIC VALVE LVOT Vmax:   109.00 cm/s LVOT Vmean:  73.100 cm/s LVOT VTI:    0.190 m  AORTA Ao Root diam: 2.80 cm MITRAL VALVE MV Area (PHT): 6.54 cm     SHUNTS MV Decel Time: 116 msec     Systemic VTI:  0.19 m MV E  velocity: 123.00 cm/s  Systemic Diam: 1.70 cm Lavona Mound Tobb DO Electronically signed by Thomasene Ripple DO Signature Date/Time: 01/09/2023/1:57:55 PM    Final    CT Chest W Contrast  Result Date: 01/08/2023 CLINICAL DATA:  Chest wall pain, nontraumatic, infection or inflammation suspected, xray done. EXAM: CT CHEST WITH CONTRAST TECHNIQUE: Multidetector CT imaging of the chest was performed during intravenous contrast administration. RADIATION DOSE REDUCTION: This exam was performed according to the departmental dose-optimization program which includes automated exposure control, adjustment of the mA and/or kV according to patient size and/or use of iterative reconstruction technique. CONTRAST:  75mL OMNIPAQUE IOHEXOL 300 MG/ML  SOLN COMPARISON:  CT scan chest from 09/21/2022. FINDINGS: Cardiovascular: Normal cardiac size. No pericardial effusion. No aortic aneurysm. There are coronary  artery calcifications, in keeping with coronary artery disease. There are also mild peripheral atherosclerotic vascular calcifications of thoracic aorta and its major branches. Mediastinum/Nodes: Enlarged and heterogeneous thyroid gland containing calcifications along the posterior aspect. These are not well evaluated on the current exam but appears essentially unchanged since the prior study. No solid / cystic mediastinal masses. The esophagus is nondistended precluding optimal assessment. There are few mildly prominent mediastinal and hilar lymph nodes, which do not meet the size criteria for lymphadenopathy and appear grossly similar to the prior study, favoring benign etiology. No axillary lymphadenopathy by size criteria. Lungs/Pleura: The central tracheo-bronchial tree is patent. Small left and moderate right pleural effusion noted with associated compressive atelectatic changes in the right lung lower lobe. There are bilateral peripheral/subpleural reticulations/scarring noted. No mass, consolidation or pneumothorax. No pulmonary edema. No suspicious lung nodules. Upper Abdomen: Visualized upper abdominal viscera within normal limits. Musculoskeletal: The visualized soft tissues of the chest wall are grossly unremarkable. No suspicious osseous lesions. There are mild multilevel degenerative changes in the visualized spine. Right shoulder arthroplasty noted. IMPRESSION: *Small left and moderate right pleural effusion. There are bilateral peripheral/subpleural reticulations/scarring. No lung mass, consolidation or pneumothorax. No pulmonary edema. No suspicious lung nodule. *No acute rib fracture.  No vertebral compression deformity. *Multiple other nonacute observations, as described above. Aortic Atherosclerosis (ICD10-I70.0). Electronically Signed   By: Jules Schick M.D.   On: 01/08/2023 15:04   DG Ribs Unilateral W/Chest Right  Result Date: 01/08/2023 CLINICAL DATA:  72 year old female with right rib  pain.  Tachypnea. EXAM: RIGHT RIBS AND CHEST - 3+ VIEW COMPARISON:  Chest CTA 09/21/2022 and earlier. FINDINGS: AP chest at 1128 hours. Chronic right shoulder arthroplasty. Stable mild cardiomegaly. Patchy new asymmetric perihilar opacity on the right. No pneumothorax or pleural effusion. This finding persists on the additional chest and rib radiographs. Background coarse pulmonary interstitial opacity appears stable. Four oblique views of the right ribs. No acute osseous abnormality identified. 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 Nonobstructed visible bowel gas. IMPRESSION: 1. New since August confluent right perihilar opacity, nonspecific query Pneumonia. No pleural effusion or pneumothorax. 2. No right rib fracture or acute osseous abnormality identified. 3. Followup PA and lateral chest X-ray is recommended in 3-4 weeks following trial of antibiotic therapy to ensure resolution and exclude underlying malignancy. Electronically Signed   By: Odessa Fleming M.D.   On: 01/08/2023 12:28     Scheduled Meds:  apixaban  5 mg Oral BID   atorvastatin  80 mg Oral Daily   DULoxetine  60 mg Oral BID   furosemide  40 mg Intravenous Q12H   insulin aspart  0-5 Units Subcutaneous QHS   insulin aspart  0-6 Units Subcutaneous  TID WC   insulin glargine-yfgn  12 Units Subcutaneous QHS   irbesartan  150 mg Oral Daily   levothyroxine  100 mcg Oral QAC breakfast   pantoprazole  40 mg Oral QAC breakfast   predniSONE  50 mg Oral Q breakfast   pregabalin  75 mg Oral BID   sodium chloride flush  3 mL Intravenous Q12H   traZODone  150 mg Oral QHS   Continuous Infusions:   LOS: 0 days   Shon Hale M.D on 01/09/2023 at 7:14 PM  Go to [email or web address] - for contact info  [facility] - Office  [phone]  If 7PM-7AM, please contact night-coverage [email or web address] 01/09/2023, 7:14 PM

## 2023-01-09 NOTE — Progress Notes (Signed)
Patient alert and verbal. Tolerated medications whole with no complaints. Patient verbalized complaints of pain during shift PRN given, see MAR. Patient educated on using the incentive spirometry and demonstrated teach back. Patient verbalized no complaints of shortness of breath during shift, continues on 2 liters of oxygen.

## 2023-01-09 NOTE — Progress Notes (Signed)
*  PRELIMINARY RESULTS* Echocardiogram 2D Echocardiogram has been performed with Definity.  Stacey Drain 01/09/2023, 1:29 PM

## 2023-01-09 NOTE — Progress Notes (Signed)
Patient complaining of pain to back and right rib, PRN given. Patient said she has nerve pain to back and uses a heating pad at home as well as takes medication for pain at home. Patient requesting hot packs or heating pad. MD Courage made aware.

## 2023-01-09 NOTE — Progress Notes (Signed)
   01/09/23 0849  TOC Brief Assessment  Insurance and Status Reviewed  Patient has primary care physician Yes  Home environment has been reviewed From home , spouse.  Prior level of function: Independent  Prior/Current Home Services No current home services  Social Determinants of Health Reivew SDOH reviewed no interventions necessary  Readmission risk has been reviewed Yes  Transition of care needs transition of care needs identified, TOC will continue to follow   CSW added Heart Health self care education to AVS. CSW will meet with pt at bedside to discuss further and monitor for additional TOC needs.

## 2023-01-09 NOTE — Plan of Care (Signed)
ST on telemetry. HR increases with ambulation to bsc. Purewick placed. Placed on 2l oxygen during the shift due to oxygen sats in the 86% on room air.  Problem: Education: Goal: Knowledge of risk factors and measures for prevention of condition will improve Outcome: Progressing   Problem: Coping: Goal: Psychosocial and spiritual needs will be supported Outcome: Progressing   Problem: Respiratory: Goal: Complications related to the disease process, condition or treatment will be avoided or minimized Outcome: Progressing   Problem: Education: Goal: Knowledge of General Education information will improve Description: Including pain rating scale, medication(s)/side effects and non-pharmacologic comfort measures Outcome: Progressing   Problem: Health Behavior/Discharge Planning: Goal: Ability to manage health-related needs will improve Outcome: Progressing   Problem: Clinical Measurements: Goal: Ability to maintain clinical measurements within normal limits will improve Outcome: Progressing Goal: Will remain free from infection Outcome: Progressing Goal: Diagnostic test results will improve Outcome: Progressing Goal: Respiratory complications will improve Outcome: Progressing Goal: Cardiovascular complication will be avoided Outcome: Progressing   Problem: Nutrition: Goal: Adequate nutrition will be maintained Outcome: Progressing   Problem: Coping: Goal: Level of anxiety will decrease Outcome: Progressing   Problem: Elimination: Goal: Will not experience complications related to bowel motility Outcome: Progressing Goal: Will not experience complications related to urinary retention Outcome: Progressing   Problem: Pain Management: Goal: General experience of comfort will improve Outcome: Progressing   Problem: Safety: Goal: Ability to remain free from injury will improve Outcome: Progressing   Problem: Skin Integrity: Goal: Risk for impaired skin integrity will  decrease Outcome: Progressing   Problem: Education: Goal: Ability to describe self-care measures that may prevent or decrease complications (Diabetes Survival Skills Education) will improve Outcome: Progressing Goal: Individualized Educational Video(s) Outcome: Progressing   Problem: Coping: Goal: Ability to adjust to condition or change in health will improve Outcome: Progressing   Problem: Fluid Volume: Goal: Ability to maintain a balanced intake and output will improve Outcome: Progressing   Problem: Health Behavior/Discharge Planning: Goal: Ability to identify and utilize available resources and services will improve Outcome: Progressing Goal: Ability to manage health-related needs will improve Outcome: Progressing   Problem: Metabolic: Goal: Ability to maintain appropriate glucose levels will improve Outcome: Progressing   Problem: Nutritional: Goal: Maintenance of adequate nutrition will improve Outcome: Progressing Goal: Progress toward achieving an optimal weight will improve Outcome: Progressing   Problem: Skin Integrity: Goal: Risk for impaired skin integrity will decrease Outcome: Progressing   Problem: Tissue Perfusion: Goal: Adequacy of tissue perfusion will improve Outcome: Progressing   Problem: Education: Goal: Ability to demonstrate management of disease process will improve Outcome: Progressing Goal: Ability to verbalize understanding of medication therapies will improve Outcome: Progressing Goal: Individualized Educational Video(s) Outcome: Progressing   Problem: Activity: Goal: Capacity to carry out activities will improve Outcome: Progressing   Problem: Cardiac: Goal: Ability to achieve and maintain adequate cardiopulmonary perfusion will improve Outcome: Progressing   Problem: Respiratory: Goal: Will maintain a patent airway Outcome: Not Progressing   Problem: Activity: Goal: Risk for activity intolerance will decrease Outcome: Not  Progressing

## 2023-01-10 DIAGNOSIS — I5033 Acute on chronic diastolic (congestive) heart failure: Secondary | ICD-10-CM | POA: Diagnosis not present

## 2023-01-10 LAB — CBC
HCT: 35.1 % — ABNORMAL LOW (ref 36.0–46.0)
Hemoglobin: 11.1 g/dL — ABNORMAL LOW (ref 12.0–15.0)
MCH: 26.4 pg (ref 26.0–34.0)
MCHC: 31.6 g/dL (ref 30.0–36.0)
MCV: 83.6 fL (ref 80.0–100.0)
Platelets: 187 10*3/uL (ref 150–400)
RBC: 4.2 MIL/uL (ref 3.87–5.11)
RDW: 14.4 % (ref 11.5–15.5)
WBC: 7.3 10*3/uL (ref 4.0–10.5)
nRBC: 0 % (ref 0.0–0.2)

## 2023-01-10 LAB — TROPONIN I (HIGH SENSITIVITY): Troponin I (High Sensitivity): 39 ng/L — ABNORMAL HIGH (ref <18)

## 2023-01-10 LAB — GLUCOSE, CAPILLARY
Glucose-Capillary: 255 mg/dL — ABNORMAL HIGH (ref 70–99)
Glucose-Capillary: 362 mg/dL — ABNORMAL HIGH (ref 70–99)
Glucose-Capillary: 395 mg/dL — ABNORMAL HIGH (ref 70–99)
Glucose-Capillary: 366 mg/dL — ABNORMAL HIGH (ref 70–99)

## 2023-01-10 MED ORDER — SENNOSIDES-DOCUSATE SODIUM 8.6-50 MG PO TABS: 2.0000 | ORAL_TABLET | Freq: Every day | ORAL | Status: DC

## 2023-01-10 MED ORDER — POLYETHYLENE GLYCOL 3350 17 G PO PACK
17.0000 g | PACK | Freq: Two times a day (BID) | ORAL | Status: DC
  Filled 2023-01-10: qty 1

## 2023-01-10 MED ORDER — PREDNISONE 20 MG PO TABS
50.0000 mg | ORAL_TABLET | Freq: Every day | ORAL | Status: AC
  Administered 2023-01-10 – 2023-01-12 (×3): 50 mg via ORAL
  Filled 2023-01-10 (×3): qty 1

## 2023-01-10 MED ORDER — NYSTATIN 100000 UNIT/GM EX POWD
Freq: Two times a day (BID) | CUTANEOUS | Status: DC
  Filled 2023-01-10: qty 15

## 2023-01-10 MED ORDER — INSULIN ASPART 100 UNIT/ML IJ SOLN
0.0000 [IU] | Freq: Three times a day (TID) | INTRAMUSCULAR | Status: DC
  Administered 2023-01-11: 7 [IU] via SUBCUTANEOUS
  Administered 2023-01-11: 20 [IU] via SUBCUTANEOUS
  Administered 2023-01-11: 15 [IU] via SUBCUTANEOUS
  Administered 2023-01-12: 11 [IU] via SUBCUTANEOUS
  Administered 2023-01-12: 7 [IU] via SUBCUTANEOUS

## 2023-01-10 MED ORDER — INSULIN GLARGINE-YFGN 100 UNIT/ML ~~LOC~~ SOLN
16.0000 [IU] | Freq: Every day | SUBCUTANEOUS | Status: DC
  Administered 2023-01-10 – 2023-01-12 (×3): 16 [IU] via SUBCUTANEOUS
  Filled 2023-01-10 (×4): qty 0.16

## 2023-01-10 MED ORDER — INSULIN ASPART 100 UNIT/ML IJ SOLN
0.0000 [IU] | Freq: Every day | INTRAMUSCULAR | Status: DC
  Administered 2023-01-10: 10 [IU] via SUBCUTANEOUS
  Administered 2023-01-11: 9 [IU] via SUBCUTANEOUS

## 2023-01-10 MED ORDER — LUBIPROSTONE 24 MCG PO CAPS
24.0000 ug | ORAL_CAPSULE | Freq: Every day | ORAL | Status: DC
  Administered 2023-01-10 – 2023-01-12 (×3): 24 ug via ORAL
  Filled 2023-01-10 (×3): qty 1

## 2023-01-10 NOTE — Progress Notes (Signed)
Alert and oriented today  On monitor HR has been in 90's.  Is 100% on 2 liters.  Has received pain medicine and heat packs for back pain .  No longer wanted purewick and has bedside commode.

## 2023-01-10 NOTE — Progress Notes (Addendum)
PROGRESS NOTE  Stephanie [name], is a 72 y.o. female, DOB - 23-Oct-1950, WJX:[id number]  Admit date - 01/08/2023   Admitting Physician Gilmore List Mariea Clonts, MD  Outpatient Primary MD for the patient is Marylynn Pearson, FNP  LOS - 1  Chief Complaint  Patient presents with   Rib Injury   Flank Pain   Cough     Brief Narrative:  72 y.o. female with medical history significant for hypertension, insulin-dependent diabetes mellitus, CKD 3A, history of DVT on Eliquis, MGUS, and hypothyroidism admitted with acute on chronic diastolic dysfunction/exacerbation in the setting of COVID-19 infection    -Assessment and Plan: 1) acute diastolic CHF exacerbation--echo from 01/09/2023 with EF of 50 to 55%, there is global hypokinesis of the left ventricle -Continue IV Lasix -Daily weight and fluid input and output monitoring  2) COVID-19 infection--dyspnea cough and respiratory symptoms noted, -Chronic hypoxic respiratory failure requiring oxygen -Prednisone and bronchodilators as prescribed  3)COPD--- prednisone and bronchodilators as above #1  4)DM2--A1C 8.7 reflecting uncontrolled DM with hyperglycemia PTA - worsening hyperglycemia with steroids noted InCrease Semglee to 16 units daily and adjust sliding scale coverage Use Novolog/Humalog Sliding scale insulin with Accu-Cheks/Fingersticks as ordered   5)H/o prior DVT and CVA--- continue Eliquis -Continue atorvastatin for secondary stroke prophylaxis  6) hypothyroidism--- c/n  levothyroxine  7)HTN--continue Avapro  8) chronic hypoxic respiratory failure--- continue supplemental oxygen  Status is: Inpatient   Disposition: The patient is from: Home              Anticipated d/c is to: Home              Anticipated d/c date is: 2 days              Patient currently is not medically stable to d/c. Barriers: Not Clinically Stable-   Code Status :  -  Code Status: Full Code   Family Communication:    NA (patient is alert, awake and coherent)    DVT Prophylaxis  :   - SCDs    apixaban (ELIQUIS) tablet 5 mg   Lab Results  Component Value Date   PLT 187 01/10/2023    Inpatient Medications  Scheduled Meds:  apixaban  5 mg Oral BID   atorvastatin  80 mg Oral Daily   DULoxetine  60 mg Oral BID   furosemide  40 mg Intravenous Q12H   insulin aspart  0-5 Units Subcutaneous QHS   insulin aspart  0-6 Units Subcutaneous TID WC   insulin glargine-yfgn  12 Units Subcutaneous QHS   Ipratropium-Albuterol  1 puff Inhalation QID   irbesartan  150 mg Oral Daily   levothyroxine  100 mcg Oral QAC breakfast   lubiprostone  24 mcg Oral Q breakfast   nystatin   Topical BID   pantoprazole  40 mg Oral QAC breakfast   predniSONE  50 mg Oral Q breakfast   pregabalin  75 mg Oral BID   sodium chloride flush  3 mL Intravenous Q12H   traZODone  150 mg Oral QHS   Continuous Infusions: PRN Meds:.acetaminophen **OR** acetaminophen, albuterol, guaiFENesin, HYDROmorphone (DILAUDID) injection, hydrOXYzine, methocarbamol, ondansetron **OR** ondansetron (ZOFRAN) IV, oxyCODONE   Anti-infectives (From admission, onward)    None        Subjective: Tommye Messerschmidt today has no fevers, no emesis,  No chest pain,   - Cough and dyspnea persist -Oral intake is fair -Complains of constipation - Objective: Vitals:   01/10/23 1216 01/10/23 1241 01/10/23 1508 01/10/23 1645  BP: 134/85  Pulse: 93     Resp:      Temp: 97.8 F (36.6 C)  98 F (36.7 C)   TempSrc:      SpO2: 100% 99%  100%  Weight:      Height:        Intake/Output Summary (Last 24 hours) at 01/10/2023 1736 Last data filed at 01/10/2023 1300 Gross per 24 hour  Intake 720 ml  Output 1000 ml  Net -280 ml   Filed Weights   01/08/23 2033 01/09/23 0500 01/10/23 0500  Weight: 101.1 kg 101.3 kg 100 kg    Physical Exam  Gen:- Awake Alert, dyspnea on exertion but no conversational dyspnea HEENT:- [place].AT, No sclera icterus Nose- [place] 3L/min Neck-Supple Neck,No JVD,.  Lungs-no  wheezing,, fair symmetrical air movement CV- S1, S2 normal, regular  Abd-  +ve B.Sounds, Abd Soft, No tenderness,    Extremity/Skin:- No  edema, pedal pulses present  Psych-affect is appropriate, oriented x3 Neuro-no new focal deficits, no tremors  Data Reviewed: I have personally reviewed following labs and imaging studies  CBC: Recent Labs  Lab 01/08/23 1122 01/09/23 0500 01/10/23 0459  WBC 10.9* 9.3 7.3  HGB 12.2 11.1* 11.1*  HCT 38.6 35.9* 35.1*  MCV 82.5 85.3 83.6  PLT 239 205 187   Basic Metabolic Panel: Recent Labs  Lab 01/08/23 1122 01/09/23 0500  NA 136 136  K 3.9 4.5  CL 105 103  CO2 24 25  GLUCOSE 212* 207*  BUN 13 15  CREATININE 1.01* 1.28*  CALCIUM 8.6* 8.4*  MG  --  1.6*   GFR: Estimated Creatinine Clearance: 49.1 mL/min (A) (by C-G formula based on SCr of 1.28 mg/dL (H)). Liver Function Tests: Recent Labs  Lab 01/08/23 1122 01/09/23 0500  AST 25 26  ALT 22 20  ALKPHOS 193* 171*  BILITOT 0.9 1.0  PROT 8.1 7.2  ALBUMIN 2.7* 2.5*   Recent Results (from the past 240 hour(s))  Resp panel by RT-PCR (RSV, Flu A&B, Covid) Anterior Nasal Swab     Status: Abnormal   Collection Time: 01/08/23 10:51 AM   Specimen: Anterior Nasal Swab  Result Value Ref Range Status   SARS Coronavirus 2 by RT PCR POSITIVE (A) NEGATIVE Final    Comment: (NOTE) SARS-CoV-2 target nucleic acids are DETECTED.  The SARS-CoV-2 RNA is generally detectable in upper respiratory specimens during the acute phase of infection. Positive results are indicative of the presence of the identified virus, but do not rule out bacterial infection or co-infection with other pathogens not detected by the test. Clinical correlation with patient history and other diagnostic information is necessary to determine patient infection status. The expected result is Negative.  Fact Sheet for Patients: BloggerCourse.com  Fact Sheet for Healthcare  Providers: SeriousBroker.it  This test is not yet approved or cleared by the [place] FDA and  has been authorized for detection and/or diagnosis of SARS-CoV-2 by FDA under an Emergency Use Authorization (EUA).  This EUA will remain in effect (meaning this test can be used) for the duration of  the COVID-19 declaration under Section 564(b)(1) of the A ct, 21 U.S.C. section 360bbb-3(b)(1), unless the authorization is terminated or revoked sooner.     Influenza A by PCR NEGATIVE NEGATIVE Final   Influenza B by PCR NEGATIVE NEGATIVE Final    Comment: (NOTE) The Xpert Xpress SARS-CoV-2/FLU/RSV plus assay is intended as an aid in the diagnosis of influenza from Nasopharyngeal swab specimens and should not be used as a sole  basis for treatment. Nasal washings and aspirates are unacceptable for Xpert Xpress SARS-CoV-2/FLU/RSV testing.  Fact Sheet for Patients: BloggerCourse.com  Fact Sheet for Healthcare Providers: SeriousBroker.it  This test is not yet approved or cleared by the [place] FDA and has been authorized for detection and/or diagnosis of SARS-CoV-2 by FDA under an Emergency Use Authorization (EUA). This EUA will remain in effect (meaning this test can be used) for the duration of the COVID-19 declaration under Section 564(b)(1) of the Act, 21 U.S.C. section 360bbb-3(b)(1), unless the authorization is terminated or revoked.     Resp Syncytial Virus by PCR NEGATIVE NEGATIVE Final    Comment: (NOTE) Fact Sheet for Patients: BloggerCourse.com  Fact Sheet for Healthcare Providers: SeriousBroker.it  This test is not yet approved or cleared by the [place] FDA and has been authorized for detection and/or diagnosis of SARS-CoV-2 by FDA under an Emergency Use Authorization (EUA). This EUA will remain in effect (meaning this test can be  used) for the duration of the COVID-19 declaration under Section 564(b)(1) of the Act, 21 U.S.C. section 360bbb-3(b)(1), unless the authorization is terminated or revoked.  Performed at [facility], [address] [place], [place] [address]   Blood culture (routine x 2)     Status: None (Preliminary result)   Collection Time: 01/08/23  3:18 PM   Specimen: BLOOD  Result Value Ref Range Status   Specimen Description BLOOD BLOOD RIGHT ARM  Final   Special Requests NONE  Final   Culture   Final    NO GROWTH 2 DAYS Performed at [facility], [address], [place], [place] [address]    Report Status PENDING  Incomplete  Blood culture (routine x 2)     Status: None (Preliminary result)   Collection Time: 01/08/23  3:18 PM   Specimen: BLOOD  Result Value Ref Range Status   Specimen Description BLOOD BLOOD RIGHT HAND  Final   Special Requests NONE  Final   Culture   Final    NO GROWTH 2 DAYS Performed at [facility], [address], [place], [place] [address]    Report Status PENDING  Incomplete    Radiology Studies: ECHOCARDIOGRAM COMPLETE  Result Date: 01/09/2023    ECHOCARDIOGRAM REPORT   Patient Name:   LUNARAE ZENGEL Clinch Date of Exam: 01/09/2023 Medical Rec #:  [id number]   Height:       68.0 in Accession #:    [id number]  Weight:       223.3 lb Date of Birth:  [birth year]   BSA:          2.142 m Patient Age:    72 years    BP:           137/76 mmHg Patient Gender: F           HR:           110 bpm. [facility] Procedure: 2D Echo, Cardiac Doppler, Color Doppler and Intracardiac            Opacification Agent Indications:    CHF-Acute Diastolic I50.31                 Elevated Troponin  History:        Patient has prior history of Echocardiogram examinations, most                 recent 10/06/2021. CHF, COPD and TIA, Arrythmias:Atrial  Fibrillation; Risk Factors:Hypertension and Diabetes.  Sonographer:    Celesta Gentile RCS Referring Phys: [id number] TIMOTHY S OPYD  IMPRESSIONS  1. Left ventricular ejection fraction, by estimation, is 50 to 55%. The left ventricle has low normal function. The left ventricle demonstrates global hypokinesis. There is mild concentric left ventricular hypertrophy. Left ventricular diastolic parameters are indeterminate.  2. Right ventricular systolic function is normal. The right ventricular size is normal.  3. The mitral valve is normal in structure. No evidence of mitral valve regurgitation. No evidence of mitral stenosis.  4. The aortic valve is normal in structure. Aortic valve regurgitation is not visualized. No aortic stenosis is present.  5. The inferior vena cava is normal in size with greater than 50% respiratory variability, suggesting right atrial pressure of 3 mmHg. FINDINGS  Left Ventricle: Left ventricular ejection fraction, by estimation, is 50 to 55%. The left ventricle has low normal function. The left ventricle demonstrates global hypokinesis. Definity contrast agent was given IV to delineate the left ventricular endocardial borders. The left ventricular internal cavity size was normal in size. There is mild concentric left ventricular hypertrophy. Left ventricular diastolic parameters are indeterminate. Right Ventricle: The right ventricular size is normal. No increase in right ventricular wall thickness. Right ventricular systolic function is normal. Left Atrium: Left atrial size was normal in size. Right Atrium: Right atrial size was normal in size. Pericardium: There is no evidence of pericardial effusion. Presence of epicardial fat layer. Mitral Valve: The mitral valve is normal in structure. No evidence of mitral valve regurgitation. No evidence of mitral valve stenosis. Tricuspid Valve: The tricuspid valve is normal in structure. Tricuspid valve regurgitation is not demonstrated. No evidence of tricuspid stenosis. Aortic Valve: The aortic valve is normal in structure. Aortic valve regurgitation is not visualized. No aortic  stenosis is present. Pulmonic Valve: The pulmonic valve was not well visualized. Pulmonic valve regurgitation is trivial. No evidence of pulmonic stenosis. Aorta: The aortic root is normal in size and structure. Venous: The inferior vena cava is normal in size with greater than 50% respiratory variability, suggesting right atrial pressure of 3 mmHg. IAS/Shunts: No atrial level shunt detected by color flow Doppler.  LEFT VENTRICLE PLAX 2D LVIDd:         4.80 cm LVIDs:         3.30 cm LV PW:         1.00 cm LV IVS:        1.20 cm LVOT diam:     1.70 cm LV SV:         43 LV SV Index:   20 LVOT Area:     2.27 cm  RIGHT VENTRICLE RV S prime:     14.40 cm/s TAPSE (M-mode): 1.9 cm LEFT ATRIUM             Index        RIGHT ATRIUM           Index LA diam:        3.70 cm 1.73 cm/m   RA Area:     15.70 cm LA Vol (A2C):   65.5 ml 30.58 ml/m  RA Volume:   40.20 ml  18.77 ml/m LA Vol (A4C):   52.1 ml 24.32 ml/m LA Biplane Vol: 62.7 ml 29.27 ml/m  AORTIC VALVE LVOT Vmax:   109.00 cm/s LVOT Vmean:  73.100 cm/s LVOT VTI:    0.190 m  AORTA Ao Root diam: 2.80 cm MITRAL VALVE MV Area (PHT): 6.54  cm     SHUNTS MV Decel Time: 116 msec     Systemic VTI:  0.19 m MV E velocity: 123.00 cm/s  Systemic Diam: 1.70 cm Kardie Tobb DO Electronically signed by Thomasene Ripple DO Signature Date/Time: 01/09/2023/1:57:55 PM    Final     Scheduled Meds:  apixaban  5 mg Oral BID   atorvastatin  80 mg Oral Daily   DULoxetine  60 mg Oral BID   furosemide  40 mg Intravenous Q12H   insulin aspart  0-5 Units Subcutaneous QHS   insulin aspart  0-6 Units Subcutaneous TID WC   insulin glargine-yfgn  12 Units Subcutaneous QHS   Ipratropium-Albuterol  1 puff Inhalation QID   irbesartan  150 mg Oral Daily   levothyroxine  100 mcg Oral QAC breakfast   lubiprostone  24 mcg Oral Q breakfast   nystatin   Topical BID   pantoprazole  40 mg Oral QAC breakfast   predniSONE  50 mg Oral Q breakfast   pregabalin  75 mg Oral BID   sodium chloride flush   3 mL Intravenous Q12H   traZODone  150 mg Oral QHS   Continuous Infusions:   LOS: 1 day   Shon Hale M.D on 01/10/2023 at 5:36 PM  Go to [email or web address] - for contact info  [facility] - Office  [phone]  If 7PM-7AM, please contact night-coverage [email or web address] 01/10/2023, 5:36 PM

## 2023-01-11 DIAGNOSIS — I825Y3 Chronic embolism and thrombosis of unspecified deep veins of proximal lower extremity, bilateral: Secondary | ICD-10-CM | POA: Diagnosis not present

## 2023-01-11 DIAGNOSIS — I5033 Acute on chronic diastolic (congestive) heart failure: Secondary | ICD-10-CM | POA: Diagnosis not present

## 2023-01-11 DIAGNOSIS — E1122 Type 2 diabetes mellitus with diabetic chronic kidney disease: Secondary | ICD-10-CM | POA: Diagnosis not present

## 2023-01-11 DIAGNOSIS — U071 COVID-19: Secondary | ICD-10-CM | POA: Diagnosis not present

## 2023-01-11 LAB — BASIC METABOLIC PANEL
Anion gap: 10 (ref 5–15)
BUN: 31 mg/dL — ABNORMAL HIGH (ref 8–23)
CO2: 23 mmol/L (ref 22–32)
Calcium: 7.8 mg/dL — ABNORMAL LOW (ref 8.9–10.3)
Chloride: 97 mmol/L — ABNORMAL LOW (ref 98–111)
Creatinine, Ser: 1.91 mg/dL — ABNORMAL HIGH (ref 0.44–1.00)
GFR, Estimated: 28 mL/min — ABNORMAL LOW (ref >60)
Glucose, Bld: 340 mg/dL — ABNORMAL HIGH (ref 70–99)
Potassium: 4.3 mmol/L (ref 3.5–5.1)
Sodium: 130 mmol/L — ABNORMAL LOW (ref 135–145)

## 2023-01-11 LAB — GLUCOSE, CAPILLARY
Glucose-Capillary: 342 mg/dL — ABNORMAL HIGH (ref 70–99)
Glucose-Capillary: 353 mg/dL — ABNORMAL HIGH (ref 70–99)
Glucose-Capillary: 204 mg/dL — ABNORMAL HIGH (ref 70–99)
Glucose-Capillary: 341 mg/dL — ABNORMAL HIGH (ref 70–99)

## 2023-01-11 MED ORDER — FUROSEMIDE 10 MG/ML IJ SOLN
60.0000 mg | Freq: Every day | INTRAMUSCULAR | Status: DC
  Filled 2023-01-11: qty 6

## 2023-01-11 MED ORDER — PREDNISONE 20 MG PO TABS: ORAL_TABLET | ORAL | 0 refills | Status: DC

## 2023-01-11 MED ORDER — TORSEMIDE 20 MG PO TABS: 40.0000 mg | ORAL_TABLET | Freq: Every day | ORAL | 0 refills | Status: DC

## 2023-01-11 NOTE — Progress Notes (Signed)
Continued Isolation for Covid.  No new SOB- up several times to [facility] during night - recd IV lasix. BP stable- dizzy spells reduced in frequency and intensity- Pt hopes to return home soon. VSS

## 2023-01-11 NOTE — TOC Progression Note (Signed)
Transition of Care [facility]) - Progression Note    Patient Details  Name: Stephanie [name] MRN: [id number] Date of Birth: Jul 03, 1950  [facility] CM/SW Contact  Villa Herb, [place] Phone Number: 01/11/2023, 3:17 PM  Clinical Narrative:    CSW updated that pt is requesting to speak with CSW. CSW spoke with pt, pt is requesting that CSW extend hospital stay as MD has stated she will D/C home late tomorrow. CSW explained that TOC has no say in when a D/C happens, the MD places D/C orders when they feel pt is medically stable. CSW explained that RN/secretary can assist with getting transportation if that is an issue.   Barriers to Discharge: Continued Medical Work up  Expected Discharge Plan and Services                                               Social Determinants of Health (SDOH) Interventions SDOH Screenings   Food Insecurity: No Food Insecurity (01/08/2023)  Housing: Low Risk  (01/08/2023)  Transportation Needs: No Transportation Needs (01/08/2023)  Utilities: Not At Risk (01/08/2023)  Depression (PHQ2-9): Low Risk  (01/10/2019)  Tobacco Use: Medium Risk (01/08/2023)    Readmission Risk Interventions     No data to display

## 2023-01-11 NOTE — Progress Notes (Signed)
Pt assisted with a shower this afternoon and after shower requested IV be taken out and tele not to be replaced. Pt teary-eyed and visibly frustrated this afternoon. When asked what was wrong pt stated "I don't know why one doctor tells me I will go home tomorrow night and today another tells me I am medically discharged and can leave today." RN asked pt if she wants to go home today and pt stated "I want to go home tonight at 9:30 when my daughter can drive me."  Informed MD Gwenlyn Perking of pt's desire to be discharged today and discharge orders were placed.   Upon entering pt's room to update her of her discharge information as she requested, pt states "I don't need no help from no one but God. You didn't need to change my discharge plan like that. I'm going home tomorrow at 9:30p as originally planned with my family." Informed pt that transportation can be arranged prior to 9:30p tomorrow night. Pt refused transportation assistance. Pt again, visibly irked and frustrated. Pt irritated most of the day regardless of attempts to console / treat physical and emotional needs but desiring to stay for unknown reason. Updated MD Gwenlyn Perking with pt's decision to stay until 9:30p tomorrow although medically discharged.

## 2023-01-11 NOTE — Discharge Summary (Signed)
Physician Discharge Summary   Patient: Stephanie [name] MRN: [id number] DOB: 04-01-1950  Admit date:     01/08/2023  Discharge date: 01/11/23  Discharge Physician: Vassie Loll   PCP: Marylynn Pearson, FNP   Recommendations at discharge:  Repeat basic metabolic panel to follow electrolytes and renal function Reassess blood pressure and adjust antihypertensive treatment as needed Repeat CBC to follow hemoglobin trend/stability Continue assisting patient with weight loss management Make sure patient follow-up with cardiology service  Discharge Diagnoses: Principal Problem:   Acute on chronic diastolic CHF (congestive heart failure) (HCC) Active Problems:   History of stroke   COPD (chronic obstructive pulmonary disease) (HCC)   Diabetes (HCC)   Lab test positive for detection of COVID-19 virus   Chronic kidney disease, stage 3 unspecified (HCC)   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 Antionette Char on 01/08/2023 Stephanie [name] is a 72 y.o. female with medical history significant for hypertension, insulin-dependent diabetes mellitus, CKD 3A, history of DVT on Eliquis, MGUS, and hypothyroidism who presents with right-sided pleuritic pain and dyspnea.   Patient states that she developed sinus congestion, rhinorrhea, and fatigue at the beginning of this month, thought that she may have contracted COVID, and states that the symptoms have improved.  While the URI symptoms have improved, she has become dyspneic over the past 2 weeks and then developed pleuritic pain on the right yesterday.  This pain has been severe whenever she takes a deep breath or coughs.  She reports adherence with Eliquis, does not have a gallbladder, and denies dysuria, hematuria, fever, or chills.   ED Course: Upon arrival to the ED, patient is  found to be afebrile and saturating low 90s on room air with mild tachypnea, mild tachycardia, and elevated blood pressure.  EKG demonstrates sinus tachycardia and chest x-ray is concerning for right perihilar opacity.  CT chest with contrast reveals small left and moderate right pleural effusions.  Labs are most notable for glucose 213, creatinine 1.01, albumin 2.7, normal lactic acid, positive COVID-19 PCR, and troponin of 44.  Assessment and Plan: 1-acute on chronic diastolic heart failure -Patient with global hypokinesis and ejection fraction of 50 to 55% (echo 01/09/2023). -Adequately diuresed onto her creatinine slightly bumped up -Low-sodium diet and adequate hydration discussed with patient -Discharge home on adjusted dose of Demadex daily -Instructed to follow with PCP in 2 weeks -Continue to closely assess patient's volume status and further adjust diuretic regimen as required. -Outpatient follow-up with cardiology service discussed with patient. -Resume the use of Jardiance -And continue the use of bisoprolol and Avapro.  2-COVID-19 infection Good saturation appreciated at time of examination -Overall symptoms improved -No significant respiratory distress or wheezing at time of discharge -Able to speak in full sentences -Continue prednisone steroids and resumption of home bronchodilator management. -Discussed with patient the importance of social distancing, good hand hygiene and the use of mask.  3-history of COPD -Patient with chronic respiratory failure using 1-2 L nasal cannula supplementation at baseline -Continue home oxygen supplementation -Continue home bronchodilator management and the use of Singulair. -Continue patient follow-up with PCP.  4-history of DVT and prior CVA -Continue risk factor modifications -Continue the use of Eliquis.  5-type 2 diabetes mellitus -A1c 8.7 reflect pain uncontrolled diabetes -Continue close outpatient follow-up patient CBGs with  further adjustment  to hypoglycemic regimen as needed -Resume home hypoglycemic medications.  6-hypertension -Continue home antihypertensive agents -Blood pressure stable and well-controlled  7-hypothyroidism -Continue Synthroid.  8-gastroesophageal flux disease -Continue the use of PPI.  9-class I obesity -Low-calorie diet and portion control discussed with patient -Body mass index is 34.49 kg/m.   Consultants: None Procedures performed: See below for x-ray reports. Disposition: Home Diet recommendation: Modified carbohydrates; heart healthy/low-sodium diet and low calorie diet.  DISCHARGE MEDICATION: Allergies as of 01/11/2023       Reactions   Tetracyclines & Related Anaphylaxis, Rash   Banana Hives, Nausea And Vomiting   Penicillins Rash, Other (See Comments)        Medication List     STOP taking these medications    fluticasone 50 MCG/ACT nasal spray Commonly known as: FLONASE       TAKE these medications    Accu-Chek FastClix Lancets Misc Apply topically.   Accu-Chek Guide Control Liqd See admin instructions.   Accu-Chek Guide Me w/Device Kit 4 (four) times daily.   Accu-Chek Guide test strip Generic drug: glucose blood 4 (four) times daily.   albuterol (2.5 MG/3ML) 0.083% nebulizer solution Commonly known as: PROVENTIL Take 2.5 mg by nebulization every 6 (six) hours as needed for wheezing or shortness of breath.   albuterol 108 (90 Base) MCG/ACT inhaler Commonly known as: VENTOLIN HFA Inhale 1-2 puffs into the lungs every 6 (six) hours as needed for wheezing or shortness of breath.   Alcohol Pads 70 % Pads SMARTSIG:Pledget(s) Topical 4 Times Daily   apixaban 5 MG Tabs tablet Commonly known as: ELIQUIS Take 1 tablet (5 mg total) by mouth 2 (two) times daily.   atorvastatin 80 MG tablet Commonly known as: LIPITOR Take 1 tablet (80 mg total) by mouth daily.   bisoprolol 10 MG tablet Commonly known as: ZEBETA TAKE 1 TABLET(10 MG) BY  MOUTH IN THE MORNING AND AT BEDTIME   Cetirizine HCl 10 MG Caps Take 10 mg by mouth daily.   cimetidine 200 MG tablet Commonly known as: TAGAMET Take 0.5 tablets (100 mg total) by mouth daily as needed. What changed: reasons to take this   CINNAMON PO Take 1-2 capsules by mouth 3 (three) times daily.   clobetasol ointment 0.05 % Commonly known as: TEMOVATE Apply 1 application topically 3 (three) times daily as needed (imflammation).   cyanocobalamin 1000 MCG tablet Commonly known as: VITAMIN B12 Take 1,000 mcg by mouth daily.   DULoxetine 60 MG capsule Commonly known as: CYMBALTA Take 60 mg by mouth 2 (two) times daily.   Easy Comfort Pen Needles 31G X 5 MM Misc Generic drug: Insulin Pen Needle INJECT IN SULIN EVERY DAY AS DIRECTED   Easy Mini Eject Lancing Device Misc 4 (four) times daily.   empagliflozin 10 MG Tabs tablet Commonly known as: Jardiance Take 1 tablet (10 mg total) by mouth daily.   fluticasone 27.5 MCG/SPRAY nasal spray Commonly known as: VERAMYST Place 2 sprays into the nose daily.   guaiFENesin 100 MG/5ML Soln Commonly known as: ROBITUSSIN Take 5 mLs (100 mg total) by mouth every 4 (four) hours as needed for cough or to loosen phlegm.   HumaLOG KwikPen 100 UNIT/ML KwikPen Generic drug: insulin lispro ADMINISTER 5 TO 11 UNITS UNDER THE SKIN THREE TIMES DAILY PER SLIDING SCALE   hydrOXYzine 25 MG tablet Commonly known as: ATARAX Take 25 mg by mouth every 8 (eight) hours as needed for itching.   ipratropium-albuterol 0.5-2.5 (3) MG/3ML Soln Commonly known as: DUONEB  Take 3 mLs by nebulization every 6 (six) hours as needed (shortness of breath).   levothyroxine 100 MCG tablet Commonly known as: SYNTHROID Take 1 tablet (100 mcg total) by mouth daily before breakfast.   lidocaine 5 % Commonly known as: Lidoderm Place 1 patch onto the skin daily. Remove & Discard patch within 12 hours or as directed by MD   lubiprostone 24 MCG  capsule Commonly known as: AMITIZA Take 1 capsule (24 mcg total) by mouth 2 (two) times daily with a meal. What changed:  when to take this reasons to take this   Lyrica 75 MG capsule Generic drug: pregabalin Take 75 mg by mouth 2 (two) times daily.   methocarbamol 500 MG tablet Commonly known as: Robaxin Take 1 tablet (500 mg total) by mouth 3 (three) times daily as needed. What changed: reasons to take this   montelukast 10 MG tablet Commonly known as: SINGULAIR Take 10 mg by mouth at bedtime.   pantoprazole 40 MG tablet Commonly known as: PROTONIX Take 1 tablet (40 mg total) by mouth daily before breakfast.   predniSONE 20 MG tablet Commonly known as: DELTASONE Take 2 tablets by mouth daily x 2 days; then 1 tablet by mouth daily x 3 days; then half tablet by mouth daily x 3 days and stop prednisone. Start taking on: January 12, 2023   Repatha SureClick 140 MG/ML Soaj Generic drug: Evolocumab ADMINISTER 1 ML UNDER THE SKIN EVERY 14 DAYS   Restasis 0.05 % ophthalmic emulsion Generic drug: cycloSPORINE Place 1 drop into both eyes 2 (two) times daily.   telmisartan 20 MG tablet Commonly known as: MICARDIS Take 40 mg by mouth daily.   tirzepatide 10 MG/0.5ML Pen Commonly known as: MOUNJARO Inject 10 mg into the skin once a week.   torsemide 20 MG tablet Commonly known as: DEMADEX Take 2 tablets (40 mg total) by mouth daily. What changed:  how much to take when to take this   traMADol 50 MG tablet Commonly known as: ULTRAM Take 50 mg by mouth 3 (three) times daily as needed for moderate pain.   traZODone 150 MG tablet Commonly known as: DESYREL Take 150 mg by mouth at bedtime.   Evaristo Bury FlexTouch 100 UNIT/ML FlexTouch Pen Generic drug: insulin degludec Inject 20 Units into the skin at bedtime.   ursodiol 300 MG capsule Commonly known as: ACTIGALL Take 300 mg by mouth 2 (two) times daily.   vitamin C 1000 MG tablet Take 500 mg by mouth daily.    Vitamin D 50 MCG (2000 UT) tablet Take 4,000 Units by mouth daily.        Follow-up Information     Marylynn Pearson, FNP. Schedule an appointment as soon as possible for a visit in 2 week(s).   Specialty: Family Medicine Contact information: [address] [place] [address] [phone]                Discharge Exam: Ceasar Mons Weights   01/09/23 0500 01/10/23 0500 01/11/23 0504  Weight: 101.3 kg 100 kg 102.9 kg   General exam: Alert, awake, oriented x 3; no chest pain, no nausea, no vomiting.  Speaking in full sentences and intermittently demonstrating good saturation on room air (patient's baseline; using at home as needed 1-2 L nasal cannula supplementation). Respiratory system: No frank crackles; positive scattered rhonchi.  No using accessory muscles. Cardiovascular system: Rate controlled, no rubs, no gallops, no JVD. Gastrointestinal system: Abdomen is obese, nondistended, soft and nontender. No  organomegaly or masses felt. Normal bowel sounds heard. Central nervous system:No focal neurological deficits. Extremities: No cyanosis or clubbing. Skin: No petechiae. Psychiatry: Mood & affect appropriate.    Condition at discharge: Stable and improved.  The results of significant diagnostics from this hospitalization (including imaging, microbiology, ancillary and laboratory) are listed below for reference.   Imaging Studies: ECHOCARDIOGRAM COMPLETE  Result Date: 01/09/2023    ECHOCARDIOGRAM REPORT   Patient Name:   Stephanie [name] Date of Exam: 01/09/2023 Medical Rec #:  [id number]   Height:       68.0 in Accession #:    [id number]  Weight:       223.3 lb Date of Birth:  1950/07/15   BSA:          2.142 m Patient Age:    72 years    BP:           137/76 mmHg Patient Gender: F           HR:           110 bpm. [facility] Procedure: 2D Echo, Cardiac Doppler, Color Doppler and Intracardiac            Opacification Agent Indications:    CHF-Acute  Diastolic I50.31                 Elevated Troponin  History:        Patient has prior history of Echocardiogram examinations, most                 recent 10/06/2021. CHF, COPD and TIA, Arrythmias:Atrial                 Fibrillation; Risk Factors:Hypertension and Diabetes.  Sonographer:    Celesta Gentile RCS Referring Phys: [id number] TIMOTHY S OPYD IMPRESSIONS  1. Left ventricular ejection fraction, by estimation, is 50 to 55%. The left ventricle has low normal function. The left ventricle demonstrates global hypokinesis. There is mild concentric left ventricular hypertrophy. Left ventricular diastolic parameters are indeterminate.  2. Right ventricular systolic function is normal. The right ventricular size is normal.  3. The mitral valve is normal in structure. No evidence of mitral valve regurgitation. No evidence of mitral stenosis.  4. The aortic valve is normal in structure. Aortic valve regurgitation is not visualized. No aortic stenosis is present.  5. The inferior vena cava is normal in size with greater than 50% respiratory variability, suggesting right atrial pressure of 3 mmHg. FINDINGS  Left Ventricle: Left ventricular ejection fraction, by estimation, is 50 to 55%. The left ventricle has low normal function. The left ventricle demonstrates global hypokinesis. Definity contrast agent was given IV to delineate the left ventricular endocardial borders. The left ventricular internal cavity size was normal in size. There is mild concentric left ventricular hypertrophy. Left ventricular diastolic parameters are indeterminate. Right Ventricle: The right ventricular size is normal. No increase in right ventricular wall thickness. Right ventricular systolic function is normal. Left Atrium: Left atrial size was normal in size. Right Atrium: Right atrial size was normal in size. Pericardium: There is no evidence of pericardial effusion. Presence of epicardial fat layer. Mitral Valve: The mitral valve is normal in  structure. No evidence of mitral valve regurgitation. No evidence of mitral valve stenosis. Tricuspid Valve: The tricuspid valve is normal in structure. Tricuspid valve regurgitation is not demonstrated. No evidence of tricuspid stenosis. Aortic Valve: The aortic valve is normal in structure. Aortic valve regurgitation is not visualized.  No aortic stenosis is present. Pulmonic Valve: The pulmonic valve was not well visualized. Pulmonic valve regurgitation is trivial. No evidence of pulmonic stenosis. Aorta: The aortic root is normal in size and structure. Venous: The inferior vena cava is normal in size with greater than 50% respiratory variability, suggesting right atrial pressure of 3 mmHg. IAS/Shunts: No atrial level shunt detected by color flow Doppler.  LEFT VENTRICLE PLAX 2D LVIDd:         4.80 cm LVIDs:         3.30 cm LV PW:         1.00 cm LV IVS:        1.20 cm LVOT diam:     1.70 cm LV SV:         43 LV SV Index:   20 LVOT Area:     2.27 cm  RIGHT VENTRICLE RV S prime:     14.40 cm/s TAPSE (M-mode): 1.9 cm LEFT ATRIUM             Index        RIGHT ATRIUM           Index LA diam:        3.70 cm 1.73 cm/m   RA Area:     15.70 cm LA Vol (A2C):   65.5 ml 30.58 ml/m  RA Volume:   40.20 ml  18.77 ml/m LA Vol (A4C):   52.1 ml 24.32 ml/m LA Biplane Vol: 62.7 ml 29.27 ml/m  AORTIC VALVE LVOT Vmax:   109.00 cm/s LVOT Vmean:  73.100 cm/s LVOT VTI:    0.190 m  AORTA Ao Root diam: 2.80 cm MITRAL VALVE MV Area (PHT): 6.54 cm     SHUNTS MV Decel Time: 116 msec     Systemic VTI:  0.19 m MV E velocity: 123.00 cm/s  Systemic Diam: 1.70 cm Kardie Tobb DO Electronically signed by Thomasene Ripple DO Signature Date/Time: 01/09/2023/1:57:55 PM    Final    CT Chest W Contrast  Result Date: 01/08/2023 CLINICAL DATA:  Chest wall pain, nontraumatic, infection or inflammation suspected, xray done. EXAM: CT CHEST WITH CONTRAST TECHNIQUE: Multidetector CT imaging of the chest was performed during intravenous contrast  administration. RADIATION DOSE REDUCTION: This exam was performed according to the departmental dose-optimization program which includes automated exposure control, adjustment of the mA and/or kV according to patient size and/or use of iterative reconstruction technique. CONTRAST:  75mL OMNIPAQUE IOHEXOL 300 MG/ML  SOLN COMPARISON:  CT scan chest from 09/21/2022. FINDINGS: Cardiovascular: Normal cardiac size. No pericardial effusion. No aortic aneurysm. There are coronary artery calcifications, in keeping with coronary artery disease. There are also mild peripheral atherosclerotic vascular calcifications of thoracic aorta and its major branches. Mediastinum/Nodes: Enlarged and heterogeneous thyroid gland containing calcifications along the posterior aspect. These are not well evaluated on the current exam but appears essentially unchanged since the prior study. No solid / cystic mediastinal masses. The esophagus is nondistended precluding optimal assessment. There are few mildly prominent mediastinal and hilar lymph nodes, which do not meet the size criteria for lymphadenopathy and appear grossly similar to the prior study, favoring benign etiology. No axillary lymphadenopathy by size criteria. Lungs/Pleura: The central tracheo-bronchial tree is patent. Small left and moderate right pleural effusion noted with associated compressive atelectatic changes in the right lung lower lobe. There are bilateral peripheral/subpleural reticulations/scarring noted. No mass, consolidation or pneumothorax. No pulmonary edema. No suspicious lung nodules. Upper Abdomen: Visualized upper abdominal viscera within normal limits.  Musculoskeletal: The visualized soft tissues of the chest wall are grossly unremarkable. No suspicious osseous lesions. There are mild multilevel degenerative changes in the visualized spine. Right shoulder arthroplasty noted. IMPRESSION: *Small left and moderate right pleural effusion. There are bilateral  peripheral/subpleural reticulations/scarring. No lung mass, consolidation or pneumothorax. No pulmonary edema. No suspicious lung nodule. *No acute rib fracture.  No vertebral compression deformity. *Multiple other nonacute observations, as described above. Aortic Atherosclerosis (ICD10-I70.0). Electronically Signed   By: Jules Schick M.D.   On: 01/08/2023 15:04   DG Ribs Unilateral W/Chest Right  Result Date: 01/08/2023 CLINICAL DATA:  72 year old female with right rib pain.  Tachypnea. EXAM: RIGHT RIBS AND CHEST - 3+ VIEW COMPARISON:  Chest CTA 09/21/2022 and earlier. FINDINGS: AP chest at 1128 hours. Chronic right shoulder arthroplasty. Stable mild cardiomegaly. Patchy new asymmetric perihilar opacity on the right. No pneumothorax or pleural effusion. This finding persists on the additional chest and rib radiographs. Background coarse pulmonary interstitial opacity appears stable. Four oblique views of the right ribs. No acute osseous abnormality identified. 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 Nonobstructed visible bowel gas. IMPRESSION: 1. New since August confluent right perihilar opacity, nonspecific query Pneumonia. No pleural effusion or pneumothorax. 2. No right rib fracture or acute osseous abnormality identified. 3. Followup PA and lateral chest X-ray is recommended in 3-4 weeks following trial of antibiotic therapy to ensure resolution and exclude underlying malignancy. Electronically Signed   By: Odessa Fleming M.D.   On: 01/08/2023 12:28    Microbiology: Results for orders placed or performed during the hospital encounter of 01/08/23  Resp panel by RT-PCR (RSV, Flu A&B, Covid) Anterior Nasal Swab     Status: Abnormal   Collection Time: 01/08/23 10:51 AM   Specimen: Anterior Nasal Swab  Result Value Ref Range Status   SARS Coronavirus 2 by RT PCR POSITIVE (A) NEGATIVE Final    Comment: (NOTE) SARS-CoV-2 target nucleic acids are DETECTED.  The  SARS-CoV-2 RNA is generally detectable in upper respiratory specimens during the acute phase of infection. Positive results are indicative of the presence of the identified virus, but do not rule out bacterial infection or co-infection with other pathogens not detected by the test. Clinical correlation with patient history and other diagnostic information is necessary to determine patient infection status. The expected result is Negative.  Fact Sheet for Patients: BloggerCourse.com  Fact Sheet for Healthcare Providers: SeriousBroker.it  This test is not yet approved or cleared by the [place] FDA and  has been authorized for detection and/or diagnosis of SARS-CoV-2 by FDA under an Emergency Use Authorization (EUA).  This EUA will remain in effect (meaning this test can be used) for the duration of  the COVID-19 declaration under Section 564(b)(1) of the A ct, 21 U.S.C. section 360bbb-3(b)(1), unless the authorization is terminated or revoked sooner.     Influenza A by PCR NEGATIVE NEGATIVE Final   Influenza B by PCR NEGATIVE NEGATIVE Final    Comment: (NOTE) The Xpert Xpress SARS-CoV-2/FLU/RSV plus assay is intended as an aid in the diagnosis of influenza from Nasopharyngeal swab specimens and should not be used as a sole basis for treatment. Nasal washings and aspirates are unacceptable for Xpert Xpress SARS-CoV-2/FLU/RSV testing.  Fact Sheet for Patients: BloggerCourse.com  Fact Sheet for Healthcare Providers: SeriousBroker.it  This test is not yet approved or cleared by the [place] FDA and has been authorized for detection and/or diagnosis of SARS-CoV-2 by FDA under an Emergency Use Authorization (  EUA). This EUA will remain in effect (meaning this test can be used) for the duration of the COVID-19 declaration under Section 564(b)(1) of the Act, 21 U.S.C. section  360bbb-3(b)(1), unless the authorization is terminated or revoked.     Resp Syncytial Virus by PCR NEGATIVE NEGATIVE Final    Comment: (NOTE) Fact Sheet for Patients: BloggerCourse.com  Fact Sheet for Healthcare Providers: SeriousBroker.it  This test is not yet approved or cleared by the [place] FDA and has been authorized for detection and/or diagnosis of SARS-CoV-2 by FDA under an Emergency Use Authorization (EUA). This EUA will remain in effect (meaning this test can be used) for the duration of the COVID-19 declaration under Section 564(b)(1) of the Act, 21 U.S.C. section 360bbb-3(b)(1), unless the authorization is terminated or revoked.  Performed at [facility], [address], [place], [place] [address]   Blood culture (routine x 2)     Status: None (Preliminary result)   Collection Time: 01/08/23  3:18 PM   Specimen: BLOOD  Result Value Ref Range Status   Specimen Description BLOOD BLOOD RIGHT ARM  Final   Special Requests NONE  Final   Culture   Final    NO GROWTH 3 DAYS Performed at [facility], [address], [place], [place] [address]    Report Status PENDING  Incomplete  Blood culture (routine x 2)     Status: None (Preliminary result)   Collection Time: 01/08/23  3:18 PM   Specimen: BLOOD  Result Value Ref Range Status   Specimen Description BLOOD BLOOD RIGHT HAND  Final   Special Requests NONE  Final   Culture   Final    NO GROWTH 3 DAYS Performed at [facility] [place], [address] [place], [place] [address]    Report Status PENDING  Incomplete    Labs: CBC: Recent Labs  Lab 01/08/23 1122 01/09/23 0500 01/10/23 0459  WBC 10.9* 9.3 7.3  HGB 12.2 11.1* 11.1*  HCT 38.6 35.9* 35.1*  MCV 82.5 85.3 83.6  PLT 239 205 187   Basic Metabolic Panel: Recent Labs  Lab 01/08/23 1122 01/09/23 0500 01/11/23 0426  NA 136 136 130*  K 3.9 4.5 4.3  CL 105 103 97*  CO2 24 25 23   GLUCOSE 212* 207* 340*   BUN 13 15 31*  CREATININE 1.01* 1.28* 1.91*  CALCIUM 8.6* 8.4* 7.8*  MG  --  1.6*  --    Liver Function Tests: Recent Labs  Lab 01/08/23 1122 01/09/23 0500  AST 25 26  ALT 22 20  ALKPHOS 193* 171*  BILITOT 0.9 1.0  PROT 8.1 7.2  ALBUMIN 2.7* 2.5*   CBG: Recent Labs  Lab 01/10/23 1602 01/10/23 2156 01/11/23 0801 01/11/23 1116 01/11/23 1630  GLUCAP 395* 366* 342* 353* 204*    Discharge time spent: greater than 30 minutes.  Signed: Vassie Loll, MD [facility] 01/11/2023

## 2023-01-11 NOTE — Inpatient Diabetes Management (Signed)
Inpatient Diabetes Program Recommendations  AACE/ADA: New Consensus Statement on Inpatient Glycemic Control (2015)  Target Ranges:  Prepandial:   less than 140 mg/dL      Peak postprandial:   less than 180 mg/dL (1-2 hours)      Critically ill patients:  140 - 180 mg/dL   Lab Results  Component Value Date   GLUCAP 342 (H) 01/11/2023   HGBA1C 8.7 (A) 09/29/2022    Latest Reference Range & Units 01/10/23 08:10 01/10/23 12:14 01/10/23 16:02 01/10/23 21:56 01/11/23 08:01  Glucose-Capillary 70 - 99 mg/dL 161 (H) 096 (H) 045 (H) 366 (H) 342 (H)  (H): Data is abnormally high  Review of Glycemic Control  Diabetes history: DM2 Outpatient Diabetes medications: Tresiba 20 units daily, Humalog 5-11 units tid meal coverage, Jardiance 10 mg daily, Mounjaro 10 mg weekly Current orders for Inpatient glycemic control: Semglee 16 units daily, Novolog 0-20 units tid, 0-10 units hs  Inpatient Diabetes Program Recommendations:   Please consider: -Add Novolog 5 units tid meal coverage if patient eats 50% meals -Increase Semglee to 20 units daily -Decrease Novolog correction hs to 0-5 units  Thank you, Darel Hong E. Alf Doyle, RN, MSN, CDCES  Diabetes Coordinator Inpatient Glycemic Control Team Team Pager [phone] (8am-5pm) 01/11/2023 9:50 AM

## 2023-01-12 LAB — GLUCOSE, CAPILLARY
Glucose-Capillary: 221 mg/dL — ABNORMAL HIGH (ref 70–99)
Glucose-Capillary: 288 mg/dL — ABNORMAL HIGH (ref 70–99)

## 2023-01-12 MED ORDER — TORSEMIDE 20 MG PO TABS
40.0000 mg | ORAL_TABLET | Freq: Every day | ORAL | Status: DC
  Filled 2023-01-12: qty 2

## 2023-01-12 MED ORDER — IPRATROPIUM-ALBUTEROL 20-100 MCG/ACT IN AERS: 1.0000 | INHALATION_SPRAY | Freq: Two times a day (BID) | RESPIRATORY_TRACT | Status: DC

## 2023-01-12 NOTE — Care Management Important Message (Signed)
Important Message  Patient Details  Name: Stephanie [name] MRN: [id number] Date of Birth: 01-Apr-1950   Important Message Given:  N/A - LOS <3 / Initial given by admissions     Corey Harold 01/12/2023, 10:58 AM

## 2023-01-12 NOTE — Progress Notes (Signed)
Patient hemodynamically stable; no overnight events.  Chart review and remains ready for discharge.  Serology 6 and transportation issues has prevented her to be safely discharged on 01/11/2023 after initially planned.  Medication has been reviewed and no changes needed.  Outpatient follow-up with PCP in 2 weeks recommended.   Vassie Loll MD [phone]

## 2023-01-12 NOTE — Plan of Care (Signed)
  Problem: Education: Goal: Knowledge of risk factors and measures for prevention of condition will improve Outcome: Progressing   Problem: Coping: Goal: Psychosocial and spiritual needs will be supported Outcome: Progressing   Problem: Respiratory: Goal: Will maintain a patent airway Outcome: Progressing Goal: Complications related to the disease process, condition or treatment will be avoided or minimized Outcome: Progressing   Problem: Education: Goal: Knowledge of General Education information will improve Description: Including pain rating scale, medication(s)/side effects and non-pharmacologic comfort measures Outcome: Progressing   Problem: Health Behavior/Discharge Planning: Goal: Ability to manage health-related needs will improve Outcome: Progressing   Problem: Clinical Measurements: Goal: Ability to maintain clinical measurements within normal limits will improve Outcome: Progressing Goal: Will remain free from infection Outcome: Progressing Goal: Diagnostic test results will improve Outcome: Progressing Goal: Respiratory complications will improve Outcome: Progressing Goal: Cardiovascular complication will be avoided Outcome: Progressing   Problem: Activity: Goal: Risk for activity intolerance will decrease Outcome: Progressing   Problem: Nutrition: Goal: Adequate nutrition will be maintained Outcome: Progressing   Problem: Coping: Goal: Level of anxiety will decrease Outcome: Progressing   Problem: Elimination: Goal: Will not experience complications related to bowel motility Outcome: Progressing Goal: Will not experience complications related to urinary retention Outcome: Progressing   Problem: Pain Management: Goal: General experience of comfort will improve Outcome: Progressing   Problem: Safety: Goal: Ability to remain free from injury will improve Outcome: Progressing   Problem: Skin Integrity: Goal: Risk for impaired skin integrity will  decrease Outcome: Progressing   Problem: Education: Goal: Ability to describe self-care measures that may prevent or decrease complications (Diabetes Survival Skills Education) will improve Outcome: Progressing Goal: Individualized Educational Video(s) Outcome: Progressing   Problem: Coping: Goal: Ability to adjust to condition or change in health will improve Outcome: Progressing   Problem: Fluid Volume: Goal: Ability to maintain a balanced intake and output will improve Outcome: Progressing   Problem: Health Behavior/Discharge Planning: Goal: Ability to identify and utilize available resources and services will improve Outcome: Progressing Goal: Ability to manage health-related needs will improve Outcome: Progressing   Problem: Metabolic: Goal: Ability to maintain appropriate glucose levels will improve Outcome: Progressing   Problem: Nutritional: Goal: Maintenance of adequate nutrition will improve Outcome: Progressing Goal: Progress toward achieving an optimal weight will improve Outcome: Progressing   Problem: Skin Integrity: Goal: Risk for impaired skin integrity will decrease Outcome: Progressing   Problem: Tissue Perfusion: Goal: Adequacy of tissue perfusion will improve Outcome: Progressing   Problem: Education: Goal: Ability to demonstrate management of disease process will improve Outcome: Progressing Goal: Ability to verbalize understanding of medication therapies will improve Outcome: Progressing Goal: Individualized Educational Video(s) Outcome: Progressing   Problem: Activity: Goal: Capacity to carry out activities will improve Outcome: Progressing   Problem: Cardiac: Goal: Ability to achieve and maintain adequate cardiopulmonary perfusion will improve Outcome: Progressing

## 2023-01-12 NOTE — Progress Notes (Signed)
Pts daughter contacted about discharge and she will not have a ride home until 1900 this evening.

## 2023-01-12 NOTE — Progress Notes (Signed)
PT is tearful this AM. Pt states that she had problems with the previous nurses. She states that we are trying to get rid of her since she has covid. Morrie Sheldon has been notified of pts concerns.

## 2023-01-14 LAB — CULTURE, BLOOD (ROUTINE X 2)
Culture: NO GROWTH
Culture: NO GROWTH

## 2023-01-20 ENCOUNTER — Telehealth: Payer: Self-pay | Admitting: Student

## 2023-01-20 NOTE — Telephone Encounter (Signed)
Patient notified that we can switch in person appointment to telephone visit d/t pt having covid. Will fwd to provider as FYI.

## 2023-01-20 NOTE — Telephone Encounter (Signed)
Pt called in stating she has COVID and wants to know if its okay for her to have tele visit.

## 2023-01-21 ENCOUNTER — Encounter: Payer: Self-pay | Admitting: Student

## 2023-01-21 ENCOUNTER — Telehealth: Payer: Self-pay

## 2023-01-21 ENCOUNTER — Ambulatory Visit: Payer: 59 | Attending: Student | Admitting: Student

## 2023-01-21 VITALS — BP 120/70 | HR 110 | Wt 222.0 lb

## 2023-01-21 DIAGNOSIS — I5032 Chronic diastolic (congestive) heart failure: Secondary | ICD-10-CM

## 2023-01-21 DIAGNOSIS — Z79899 Other long term (current) drug therapy: Secondary | ICD-10-CM

## 2023-01-21 DIAGNOSIS — I10 Essential (primary) hypertension: Secondary | ICD-10-CM

## 2023-01-21 DIAGNOSIS — R002 Palpitations: Secondary | ICD-10-CM | POA: Diagnosis not present

## 2023-01-21 DIAGNOSIS — I5030 Unspecified diastolic (congestive) heart failure: Secondary | ICD-10-CM

## 2023-01-21 DIAGNOSIS — N1831 Chronic kidney disease, stage 3a: Secondary | ICD-10-CM

## 2023-01-21 DIAGNOSIS — E782 Mixed hyperlipidemia: Secondary | ICD-10-CM

## 2023-01-21 DIAGNOSIS — Z86718 Personal history of other venous thrombosis and embolism: Secondary | ICD-10-CM | POA: Diagnosis not present

## 2023-01-21 NOTE — Patient Instructions (Signed)
Medication Instructions:  Your physician recommends that you continue on your current medications as directed. Please refer to the Current Medication list given to you today.  *If you need a refill on your cardiac medications before your next appointment, please call your pharmacy*   Lab Work: Your physician recommends that you return for lab work. (BNP, BMET)   If you have labs (blood work) drawn today and your tests are completely normal, you will receive your results only by: MyChart Message (if you have MyChart) OR A paper copy in the mail If you have any lab test that is abnormal or we need to change your treatment, we will call you to review the results.   Testing/Procedures: NONE     Follow-Up: At [facility], you and your health needs are our priority.  As part of our continuing mission to provide you with exceptional heart care, we have created designated Provider Care Teams.  These Care Teams include your primary Cardiologist (physician) and Advanced Practice Providers (APPs -  Physician Assistants and Nurse Practitioners) who all work together to provide you with the care you need, when you need it.  We recommend signing up for the patient portal called "MyChart".  Sign up information is provided on this After Visit Summary.  MyChart is used to connect with patients for Virtual Visits (Telemedicine).  Patients are able to view lab/test results, encounter notes, upcoming appointments, etc.  Non-urgent messages can be sent to your provider as well.   To learn more about what you can do with MyChart, go to [email or web address].    Your next appointment:   6 month(s)  Provider:   Nona Dell, MD or Randall An, PA-C    Other Instructions Thank you for choosing [facility]!

## 2023-01-21 NOTE — Telephone Encounter (Signed)
  Patient Consent for Virtual Visit  :[id number]}   Stephanie [name] has provided verbal consent on 01/21/2023 for a virtual visit (video or telephone).   CONSENT FOR VIRTUAL VISIT FOR:  Stephanie [name]  By participating in this virtual visit I agree to the following:  I hereby voluntarily request, consent and authorize [facility] and its employed or contracted physicians, physician assistants, nurse practitioners or other licensed health care professionals (the Practitioner), to provide me with telemedicine health care services (the "Services") as deemed necessary by the treating Practitioner. I acknowledge and consent to receive the Services by the Practitioner via telemedicine. I understand that the telemedicine visit will involve communicating with the Practitioner through live audiovisual communication technology and the disclosure of certain medical information by electronic transmission. I acknowledge that I have been given the opportunity to request an in-person assessment or other available alternative prior to the telemedicine visit and am voluntarily participating in the telemedicine visit.  I understand that I have the right to withhold or withdraw my consent to the use of telemedicine in the course of my care at any time, without affecting my right to future care or treatment, and that the Practitioner or I may terminate the telemedicine visit at any time. I understand that I have the right to inspect all information obtained and/or recorded in the course of the telemedicine visit and may receive copies of available information for a reasonable fee.  I understand that some of the potential risks of receiving the Services via telemedicine include:  Delay or interruption in medical evaluation due to technological equipment failure or disruption; Information transmitted may not be sufficient (e.g. poor resolution of images) to allow for appropriate medical decision making by the Practitioner;  and/or  In rare instances, security protocols could fail, causing a breach of personal health information.  Furthermore, I acknowledge that it is my responsibility to provide information about my medical history, conditions and care that is complete and accurate to the best of my ability. I acknowledge that Practitioner's advice, recommendations, and/or decision may be based on factors not within their control, such as incomplete or inaccurate data provided by me or distortions of diagnostic images or specimens that may result from electronic transmissions. I understand that the practice of medicine is not an exact science and that Practitioner makes no warranties or guarantees regarding treatment outcomes. I acknowledge that a copy of this consent can be made available to me via my patient portal [facility] MyChart), or I can request a printed copy by calling the office of  [facility].    I understand that my insurance will be billed for this visit.   I have read or had this consent read to me. I understand the contents of this consent, which adequately explains the benefits and risks of the Services being provided via telemedicine.  I have been provided ample opportunity to ask questions regarding this consent and the Services and have had my questions answered to my satisfaction. I give my informed consent for the services to be provided through the use of telemedicine in my medical care

## 2023-01-21 NOTE — Progress Notes (Signed)
Virtual Visit via Telephone Note   Because of Stephanie [name]'s co-morbid illnesses, she is at least at moderate risk for complications without adequate follow up.  This format is felt to be most appropriate for this patient at this time.  The patient did not have access to video technology/had technical difficulties with video requiring transitioning to audio format only (telephone).  All issues noted in this document were discussed and addressed.  No physical exam could be performed with this format.  Please refer to the patient's chart for her consent to telehealth for [facility].    Date:  01/21/2023   ID:  Stephanie [name], DOB 04/01/1950, MRN [id number] The patient was identified using 2 identifiers.  Patient Location: Home Provider Location: Office/Clinic   PCP:  Marylynn Pearson, FNP   [facility] Providers Cardiologist:  Nona Dell, MD     Evaluation Performed:  Follow-Up Visit  Chief Complaint:  6 Month Visit/Hospital Follow-up  History of Present Illness:    Stephanie [name] is a 72 y.o. female with past medical history of chronic HFmrEF, sinus tachycardia, HTN, HLD, Type 2 DM, IBS, history of DVT (on chronic anticoagulation with Eliquis), history of CVA, MGUS, COPD and Stage 3 CKD who presents to the office today for 75-month follow-up.  She was last examined by Dr. Diona Browner in 03/2022 and had overall been feeling well at that time and her LDL had improved to 52 by most recent check since being on Repatha. She did have NYHA class II dyspnea but no acute changes in this. Was continued on Bisoprolol, Jardiance, Mounjaro, Telmisartan and Demadex.  In the interim, she was recently admitted to [facility] from 11/22 - 01/11/2023 for evaluation of right sided pleuritic pain and shortness of breath. She was found to be positive for COVID-19 and started on steroids for this. BNP was elevated at 1482 and she was treated for an acute CHF exacerbation as well. Repeat  echocardiogram did show that her EF had improved to 50 to 55% with mild LVH and normal RV function. She responded well to IV Lasix and Torsemide was increased to 40 mg daily at discharge. Weight had improved to 226 lbs on 01/11/2023 and creatinine had trended up to 1.91 with plans for follow-up labs as an outpatient.  In talking with the patient today, she reports having "good and bad days" since her hospitalization. Still having some shortness of breath at rest or with activity and is on 4 L nasal cannula at baseline. Reports having an intermittent productive cough and she has been taking Mucinex and drinking hot tea to help with this. Was also on a steroid taper and took her last dose yesterday evening. Denies any lower extremity edema or abdominal distention. Says that her weight has declined since hospital discharge and is at 222 lbs on her home scales. Reports occasional palpitations and heart rate has been elevated in the low 100's at times. She has been taking Torsemide 20 mg daily instead of 40 mg daily as she experienced too frequent of urination with higher dosing.   Past Medical History:  Diagnosis Date   Anemia    Arthritis    Asthma    COPD (chronic obstructive pulmonary disease) (HCC)    COVID-19    Deep vein thrombosis (DVT) of both lower extremities (HCC) 06/27/2015   Fibromyalgia    GERD (gastroesophageal reflux disease)    H/O hiatal hernia    Hypercholesteremia    Hypertension  Hyperthyroidism    IBS (irritable bowel syndrome)    Inappropriate sinus tachycardia (HCC)    Inner ear disease    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   Type 2 diabetes mellitus (HCC)    Past Surgical History:  Procedure Laterality Date   ABDOMINAL HYSTERECTOMY  partial   CARPAL TUNNEL RELEASE Right 1991   CATARACT EXTRACTION W/PHACO Right 05/08/2013   Procedure: CATARACT EXTRACTION PHACO AND INTRAOCULAR LENS PLACEMENT (IOC);  Surgeon: Gemma Payor, MD;  Location: [facility]  ORS;  Service: Ophthalmology;  Laterality: Right;  CDE 10.31   CATARACT EXTRACTION W/PHACO Left 08/17/2013   Procedure: CATARACT EXTRACTION PHACO AND INTRAOCULAR LENS PLACEMENT (IOC);  Surgeon: Gemma Payor, MD;  Location: [facility] ORS;  Service: Ophthalmology;  Laterality: Left;  CDE:9.03   CHOLECYSTECTOMY  1971   COLONOSCOPY WITH PROPOFOL N/A 01/06/2016   Dr. Jena Gauss: diverticulosis    DENTAL SURGERY     ESOPHAGEAL BRUSHING  08/29/2019   Procedure: ESOPHAGEAL BRUSHING;  Surgeon: Corbin Ade, MD;  Location: [facility] SUITE;  Service: Endoscopy;;   ESOPHAGOGASTRODUODENOSCOPY (EGD) WITH PROPOFOL N/A 01/06/2016   Dr. Jena Gauss: normal s/p empiric dilation    ESOPHAGOGASTRODUODENOSCOPY (EGD) WITH PROPOFOL N/A 08/29/2019   esophageal plaques vs medication residue adherent to tubular esophagus s/p KOH brushing and dilation. Medium-sized hiatal hernia. + for candida. Diflucan.    EYE SURGERY     IR BONE MARROW BIOPSY & ASPIRATION  03/31/2022   MALONEY DILATION N/A 01/06/2016   Procedure: Elease Hashimoto DILATION;  Surgeon: Corbin Ade, MD;  Location: [facility];  Service: Endoscopy;  Laterality: N/A;   MALONEY DILATION N/A 08/29/2019   Procedure: Elease Hashimoto DILATION;  Surgeon: Corbin Ade, MD;  Location: [facility];  Service: Endoscopy;  Laterality: N/A;   REVERSE SHOULDER ARTHROPLASTY Right 08/06/2017   Procedure: RIGHT REVERSE SHOULDER ARTHROPLASTY;  Surgeon: Beverely Low, MD;  Location[facility];  Service: Orthopedics;  Laterality: Right;   WRIST GANGLION EXCISION Left      Current Meds  Medication Sig   Accu-Chek FastClix Lancets MISC Apply topically.   ACCU-CHEK GUIDE test strip 4 (four) times daily.   albuterol (PROVENTIL) (2.5 MG/3ML) 0.083% nebulizer solution Take 2.5 mg by nebulization every 6 (six) hours as needed for wheezing or shortness of breath.   albuterol (VENTOLIN HFA) 108 (90 Base) MCG/ACT inhaler Inhale 1-2 puffs into the lungs every 6 (six) hours as needed for wheezing or shortness of  breath.   Alcohol Swabs (ALCOHOL PADS) 70 % PADS SMARTSIG:Pledget(s) Topical 4 Times Daily   apixaban (ELIQUIS) 5 MG TABS tablet Take 1 tablet (5 mg total) by mouth 2 (two) times daily.   Ascorbic Acid (VITAMIN C) 1000 MG tablet Take 500 mg by mouth daily.   atorvastatin (LIPITOR) 80 MG tablet Take 1 tablet (80 mg total) by mouth daily.   bisoprolol (ZEBETA) 10 MG tablet TAKE 1 TABLET(10 MG) BY MOUTH IN THE MORNING AND AT BEDTIME   Blood Glucose Calibration (ACCU-CHEK GUIDE CONTROL) LIQD See admin instructions.   Blood Glucose Monitoring Suppl (ACCU-CHEK GUIDE ME) w/Device KIT 4 (four) times daily.   Cetirizine HCl 10 MG CAPS Take 10 mg by mouth daily.   Cholecalciferol (VITAMIN D) 2000 units tablet Take 4,000 Units by mouth daily.    cimetidine (TAGAMET) 200 MG tablet Take 0.5 tablets (100 mg total) by mouth daily as needed. (Patient taking differently: Take 100 mg by mouth daily as needed (indigestion).)   CINNAMON PO Take 1-2 capsules by mouth 3 (  three) times daily.   clobetasol ointment (TEMOVATE) 0.05 % Apply 1 application topically 3 (three) times daily as needed (imflammation).   DULoxetine (CYMBALTA) 60 MG capsule Take 60 mg by mouth 2 (two) times daily.   EASY COMFORT PEN NEEDLES 31G X 5 MM MISC INJECT IN SULIN EVERY DAY AS DIRECTED   empagliflozin (JARDIANCE) 10 MG TABS tablet Take 1 tablet (10 mg total) by mouth daily.   fluticasone (VERAMYST) 27.5 MCG/SPRAY nasal spray Place 2 sprays into the nose daily.   guaiFENesin (ROBITUSSIN) 100 MG/5ML SOLN Take 5 mLs (100 mg total) by mouth every 4 (four) hours as needed for cough or to loosen phlegm.   HUMALOG KWIKPEN 100 UNIT/ML KwikPen ADMINISTER 5 TO 11 UNITS UNDER THE SKIN THREE TIMES DAILY PER SLIDING SCALE   hydrOXYzine (ATARAX/VISTARIL) 25 MG tablet Take 25 mg by mouth every 8 (eight) hours as needed for itching.    insulin degludec (TRESIBA FLEXTOUCH) 100 UNIT/ML FlexTouch Pen Inject 20 Units into the skin at bedtime.    ipratropium-albuterol (DUONEB) 0.5-2.5 (3) MG/3ML SOLN Take 3 mLs by nebulization every 6 (six) hours as needed (shortness of breath).   Lancet Devices (EASY MINI EJECT LANCING DEVICE) MISC 4 (four) times daily.   levothyroxine (SYNTHROID) 100 MCG tablet Take 1 tablet (100 mcg total) by mouth daily before breakfast.   lidocaine (LIDODERM) 5 % Place 1 patch onto the skin daily. Remove & Discard patch within 12 hours or as directed by MD   lubiprostone (AMITIZA) 24 MCG capsule Take 1 capsule (24 mcg total) by mouth 2 (two) times daily with a meal. (Patient taking differently: Take 24 mcg by mouth 2 (two) times daily as needed for constipation.)   LYRICA 75 MG capsule Take 75 mg by mouth 2 (two) times daily.    methocarbamol (ROBAXIN) 500 MG tablet Take 1 tablet (500 mg total) by mouth 3 (three) times daily as needed. (Patient taking differently: Take 500 mg by mouth 3 (three) times daily as needed for muscle spasms.)   montelukast (SINGULAIR) 10 MG tablet Take 10 mg by mouth at bedtime.   pantoprazole (PROTONIX) 40 MG tablet Take 1 tablet (40 mg total) by mouth daily before breakfast.   REPATHA SURECLICK 140 MG/ML SOAJ ADMINISTER 1 ML UNDER THE SKIN EVERY 14 DAYS   RESTASIS 0.05 % ophthalmic emulsion Place 1 drop into both eyes 2 (two) times daily.   telmisartan (MICARDIS) 20 MG tablet Take 40 mg by mouth daily.   tirzepatide (MOUNJARO) 10 MG/0.5ML Pen Inject 10 mg into the skin once a week.   torsemide (DEMADEX) 20 MG tablet Take 2 tablets (40 mg total) by mouth daily. (Patient taking differently: Take 20 mg by mouth daily.)   traMADol (ULTRAM) 50 MG tablet Take 50 mg by mouth 3 (three) times daily as needed for moderate pain.   traZODone (DESYREL) 150 MG tablet Take 150 mg by mouth at bedtime.   ursodiol (ACTIGALL) 300 MG capsule Take 300 mg by mouth 2 (two) times daily.   vitamin B-12 (CYANOCOBALAMIN) 1000 MCG tablet Take 1,000 mcg by mouth daily.     Allergies:   Tetracyclines & related,  Banana, and Penicillins   Social History   Tobacco Use   Smoking status: Former    Current packs/day: 0.00    Average packs/day: 0.3 packs/day for 30.0 years (7.5 ttl pk-yrs)    Types: Cigarettes    Start date: 02/16/1981    Quit date: 02/17/2011    Years since quitting: 11.9  Smokeless tobacco: Never  Vaping Use   Vaping status: Never Used  Substance Use Topics   Alcohol use: Not Currently    Comment: rare   Drug use: No     Family Hx: The patient's family history includes Arthritis in her father and mother; CAD in her father; COPD in her mother; Colon cancer in her niece; Dementia in her father; Diabetes in her brother, father, and mother; Hypertension in her mother; Hypothyroidism in her sister. There is no history of Colon polyps or Sleep apnea.  ROS:   Please see the history of present illness.     All other systems reviewed and are negative.   Prior CV studies:   The following studies were reviewed today:  Event Monitor: 06/2021 [employer] monitor reviewed. 30 days analyzed. Predominant rhythm is sinus with heart rate ranging from 66 bpm up to 140 bpm and average heart rate 80 bpm. There were rare PVCs and PACs representing less than 1% total beats. No sustained arrhythmias or pauses.   Echocardiogram: 12/2022 IMPRESSIONS     1. Left ventricular ejection fraction, by estimation, is 50 to 55%. The  left ventricle has low normal function. The left ventricle demonstrates  global hypokinesis. There is mild concentric left ventricular hypertrophy.  Left ventricular diastolic  parameters are indeterminate.   2. Right ventricular systolic function is normal. The right ventricular  size is normal.   3. The mitral valve is normal in structure. No evidence of mitral valve  regurgitation. No evidence of mitral stenosis.   4. The aortic valve is normal in structure. Aortic valve regurgitation is  not visualized. No aortic stenosis is present.   5. The inferior vena cava is  normal in size with greater than 50%  respiratory variability, suggesting right atrial pressure of 3 mmHg.    Labs/Other Tests and Data Reviewed:    EKG:  An ECG dated 01/08/2023 was personally reviewed today and demonstrated:  Sinus tachycardia, HR 107 with T-wave flattening. No acute changes when compared to prior tracings.   Recent Labs: 12/24/2022: TSH 1.555 01/08/2023: B Natriuretic Peptide 1,482.0 01/09/2023: ALT 20; Magnesium 1.6 01/10/2023: Hemoglobin 11.1; Platelets 187 01/11/2023: BUN 31; Creatinine, Ser 1.91; Potassium 4.3; Sodium 130   Recent Lipid Panel Lab Results  Component Value Date/Time   CHOL 150 01/29/2022 12:31 PM   CHOL 210 (H) 09/09/2021 11:41 AM   TRIG 130 01/29/2022 12:31 PM   HDL 72 01/29/2022 12:31 PM   HDL 63 09/09/2021 11:41 AM   CHOLHDL 2.1 01/29/2022 12:31 PM   LDLCALC 52 01/29/2022 12:31 PM   LDLCALC 125 (H) 09/09/2021 11:41 AM    Wt Readings from Last 3 Encounters:  01/21/23 222 lb (100.7 kg)  01/11/23 226 lb 13.7 oz (102.9 kg)  11/02/22 226 lb (102.5 kg)         Objective:    Vital Signs:  BP 120/70   Pulse (!) 110   Wt 222 lb (100.7 kg)   SpO2 93%   BMI 33.75 kg/m    General: Pleasant female sounding in NAD Psych: Normal affect. Neuro: Alert and oriented X 3.  Lungs:  Resp regular and unlabored while talking on the phone. Occasional coughing.    ASSESSMENT & PLAN:    1. Chronic HFmrEF - Her ejection fraction was previously at 45 to 50% in 05/2021 and had improved to 50 to 55% by repeat imaging last month. Her weight has declined by an additional 4 pounds since hospital discharge and she denies  any fluid retention at this time. She has been taking Torsemide 20 mg daily and is also on Jardiance 10 mg daily. Will obtain a follow-up BNP and BMET as creatinine had trended up to 1.91 at the time of hospital discharge. Continue additional medical therapy with Bisoprolol 10 mg twice daily and Telmisartan 40 mg daily.  2. Palpitations -  Prior monitor showed sinus rhythm with rare PAC's and PVC's but no significant arrhythmias. She still has occasional palpitations and episodes of tachycardia in the setting of COPD and recent COVID-19 infection.  Remains on Bisoprolol 10 mg twice daily.   3. History of Unprovoked DVT - She has remained on Eliquis 5 mg twice daily per Hematology given her history of unprovoked DVT. CBC on 01/10/2023 showed her hemoglobin was stable at 11.1 with platelets at 187 K.   4. HTN - Her blood pressure has been well-controlled when checked at home and she reports this was at 120/70 on most recent check. Continue current medical therapy with Bisoprolol 10 mg twice daily, Telmisartan 40 mg daily and Torsemide 20 mg daily.  5. HLD - Followed by her PCP. LDL was at 52 in 01/2022. Continue current medical therapy with Atorvastatin 80 mg daily and Repatha.   6. Stage 2-3 CKD - Baseline creatinine 1.1 - 1.2. Peaked at 1.91 during hospital admission while receiving IV diuresis. Will recheck a BMET.   Time:   Today, I have spent 16 minutes with the patient with telehealth technology discussing the above problems.     Medication Adjustments/Labs and Tests Ordered: Current medicines are reviewed at length with the patient today.  Concerns regarding medicines are outlined above.   Tests Ordered: Orders Placed This Encounter  Procedures   Basic Metabolic Panel (BMET)   B Nat Peptide    Medication Changes: No orders of the defined types were placed in this encounter.   Follow Up:  In Person in 6 month(s)  Signed, Ellsworth Lennox, PA-C  01/21/2023 2:53 PM    [facility]

## 2023-01-25 ENCOUNTER — Ambulatory Visit: Payer: 59 | Admitting: Adult Health

## 2023-01-27 ENCOUNTER — Telehealth: Payer: Self-pay | Admitting: Neurology

## 2023-01-27 NOTE — Telephone Encounter (Signed)
Pt called to verify appointment

## 2023-02-11 ENCOUNTER — Other Ambulatory Visit: Payer: Self-pay | Admitting: Nurse Practitioner

## 2023-02-11 DIAGNOSIS — E1122 Type 2 diabetes mellitus with diabetic chronic kidney disease: Secondary | ICD-10-CM

## 2023-02-12 LAB — BASIC METABOLIC PANEL
BUN: 13 mg/dL (ref 7–25)
CO2: 24 mmol/L (ref 20–32)
Calcium: 8.4 mg/dL — ABNORMAL LOW (ref 8.6–10.4)
Chloride: 102 mmol/L (ref 98–110)
Creat: 0.95 mg/dL (ref 0.60–1.00)
Glucose, Bld: 369 mg/dL — ABNORMAL HIGH (ref 65–99)
Potassium: 3.7 mmol/L (ref 3.5–5.3)
Sodium: 136 mmol/L (ref 135–146)

## 2023-02-12 LAB — BRAIN NATRIURETIC PEPTIDE: Brain Natriuretic Peptide: 1213 pg/mL — ABNORMAL HIGH (ref <100)

## 2023-02-15 ENCOUNTER — Telehealth: Payer: Self-pay | Admitting: Student

## 2023-02-15 NOTE — Telephone Encounter (Signed)
Pt notified of test results

## 2023-02-15 NOTE — Telephone Encounter (Signed)
Ellsworth Lennox, PA-C 02/12/2023  2:30 PM EST     Please let the patient know her kidney function has significantly improved since admission as her creatinine was previously at 1.91 and is now down to 0.95. Electrolytes normal as well. Glucose elevated at 369 and while this did not need to be a fasting sample, that is significantly elevated and she should make Endocrinology aware if this remains elevated. Fluid level still abnormal but similar to prior values. At the time of her phone visit, she was taking Torsemide 20mg  daily as she did not tolerate 40mg  daily due to frequent urination. Would take an extra 20mg  as needed for worsening lower extremity edema, weight gain > 3 lbs overnight or > 5 lbs in 1 week.

## 2023-02-15 NOTE — Telephone Encounter (Signed)
Pt called in asking for lab results °

## 2023-02-16 ENCOUNTER — Other Ambulatory Visit (HOSPITAL_COMMUNITY): Payer: Self-pay | Admitting: Family Medicine

## 2023-02-16 DIAGNOSIS — J441 Chronic obstructive pulmonary disease with (acute) exacerbation: Secondary | ICD-10-CM

## 2023-02-22 ENCOUNTER — Ambulatory Visit: Payer: 59 | Admitting: Nurse Practitioner

## 2023-02-22 ENCOUNTER — Inpatient Hospital Stay: Payer: 59 | Attending: Hematology

## 2023-02-22 DIAGNOSIS — D472 Monoclonal gammopathy: Secondary | ICD-10-CM | POA: Insufficient documentation

## 2023-03-01 ENCOUNTER — Inpatient Hospital Stay: Payer: 59

## 2023-03-04 ENCOUNTER — Inpatient Hospital Stay: Payer: 59

## 2023-03-04 DIAGNOSIS — D472 Monoclonal gammopathy: Secondary | ICD-10-CM

## 2023-03-04 DIAGNOSIS — C8300 Small cell B-cell lymphoma, unspecified site: Secondary | ICD-10-CM

## 2023-03-04 LAB — CBC WITH DIFFERENTIAL/PLATELET
Abs Immature Granulocytes: 0.02 10*3/uL (ref 0.00–0.07)
Basophils Absolute: 0.1 10*3/uL (ref 0.0–0.1)
Basophils Relative: 1 %
Eosinophils Absolute: 0.3 10*3/uL (ref 0.0–0.5)
Eosinophils Relative: 4 %
HCT: 34.8 % — ABNORMAL LOW (ref 36.0–46.0)
Hemoglobin: 10.2 g/dL — ABNORMAL LOW (ref 12.0–15.0)
Immature Granulocytes: 0 %
Lymphocytes Relative: 29 %
Lymphs Abs: 2.6 10*3/uL (ref 0.7–4.0)
MCH: 24.8 pg — ABNORMAL LOW (ref 26.0–34.0)
MCHC: 29.3 g/dL — ABNORMAL LOW (ref 30.0–36.0)
MCV: 84.5 fL (ref 80.0–100.0)
Monocytes Absolute: 0.8 10*3/uL (ref 0.1–1.0)
Monocytes Relative: 9 %
Neutro Abs: 5.1 10*3/uL (ref 1.7–7.7)
Neutrophils Relative %: 57 %
Platelets: 240 10*3/uL (ref 150–400)
RBC: 4.12 MIL/uL (ref 3.87–5.11)
RDW: 14.8 % (ref 11.5–15.5)
WBC: 8.9 10*3/uL (ref 4.0–10.5)
nRBC: 0 % (ref 0.0–0.2)

## 2023-03-04 LAB — COMPREHENSIVE METABOLIC PANEL
ALT: 13 U/L (ref 0–44)
AST: 20 U/L (ref 15–41)
Albumin: 2.5 g/dL — ABNORMAL LOW (ref 3.5–5.0)
Alkaline Phosphatase: 155 U/L — ABNORMAL HIGH (ref 38–126)
Anion gap: 9 (ref 5–15)
BUN: 19 mg/dL (ref 8–23)
CO2: 24 mmol/L (ref 22–32)
Calcium: 8.8 mg/dL — ABNORMAL LOW (ref 8.9–10.3)
Chloride: 104 mmol/L (ref 98–111)
Creatinine, Ser: 1.4 mg/dL — ABNORMAL HIGH (ref 0.44–1.00)
GFR, Estimated: 40 mL/min — ABNORMAL LOW (ref >60)
Glucose, Bld: 118 mg/dL — ABNORMAL HIGH (ref 70–99)
Potassium: 3.6 mmol/L (ref 3.5–5.1)
Sodium: 137 mmol/L (ref 135–145)
Total Bilirubin: 0.5 mg/dL (ref 0.0–1.2)
Total Protein: 8 g/dL (ref 6.5–8.1)

## 2023-03-04 LAB — LACTATE DEHYDROGENASE: LDH: 195 U/L — ABNORMAL HIGH (ref 98–192)

## 2023-03-08 ENCOUNTER — Inpatient Hospital Stay: Payer: 59 | Admitting: Hematology

## 2023-03-10 ENCOUNTER — Telehealth: Payer: Self-pay | Admitting: Neurology

## 2023-03-10 NOTE — Telephone Encounter (Signed)
Rescheduled appt on 03/16/23 at 9:15 am for initial CPAP. Informed patient if daughter is not able to bring you to the appt your insurance company may request to turn in the machine. Patient verbalized understand.

## 2023-03-10 NOTE — Telephone Encounter (Signed)
The only way her visit can be mychart is if she comes to our office or her DME company with machine and power cord so a download can be done ahead of time. The data I can see is only through 01/07/23. Looks like she's with [employer].

## 2023-03-10 NOTE — Telephone Encounter (Signed)
Setup date 12/04/22. She needs appt between 01/04/23 and 03/06/23. At this point she needs an appt asap and her DME can see if insurance will accept it even though its outside the window.

## 2023-03-10 NOTE — Telephone Encounter (Signed)
Pt is trying to be scheduled for her initial CPAP f/u which pt states she believes is over due.  Phone rep has tried to find out from date pt needs to be seen by and if her appointment can be done as a my chart vv because she is unable to come in office on the date that the slot is being held for(01-30), please call pt.

## 2023-03-11 ENCOUNTER — Ambulatory Visit: Payer: 59 | Admitting: Adult Health

## 2023-03-14 LAB — PROTEIN ELECTROPHORESIS, SERUM
A/G Ratio: 0.5 — ABNORMAL LOW (ref 0.7–1.7)
Albumin ELP: 2.6 g/dL — ABNORMAL LOW (ref 2.9–4.4)
Alpha-1-Globulin: 0.3 g/dL (ref 0.0–0.4)
Alpha-2-Globulin: 1 g/dL (ref 0.4–1.0)
Beta Globulin: 1.2 g/dL (ref 0.7–1.3)
Gamma Globulin: 2.5 g/dL — ABNORMAL HIGH (ref 0.4–1.8)
Globulin, Total: 4.9 g/dL — ABNORMAL HIGH (ref 2.2–3.9)
M-Spike, %: 1.7 g/dL — ABNORMAL HIGH
Total Protein ELP: 7.5 g/dL (ref 6.0–8.5)

## 2023-03-15 NOTE — Progress Notes (Unsigned)
[facility] [address] [place]. [place] [address] [phone]       OFFICE FOLLOW-UP NOTE  [name] Date of Birth:  [birth year] Medical Record Number:  [id number]   HPI:  Update 03/16/2023 JM: Patient returns for stroke follow-up visit as well as initial CPAP compliance visit.  Has been stable from a stroke standpoint, denies new stroke/TIA symptoms.  Remains on Eliquis, atorvastatin, Zetia and Repatha ***? Routinely follows with PCP and cardiology.   Was seen by Dr. Frances Furbish 06/2022 for sleep apnea evaluation.  HST 09/2022 showed moderate sleep apnea with total AHI of 18.9/h and O2 nadir of 86%.  AutoPap initiated 11/2022.      History provided for reference purposes only Update 04/21/2022 Dr. Pearlean Brownie: She returns for follow-up after last visit 7 months ago.  She states she is doing well.  She has had no recurrent stroke or TIA symptoms.  Remains on Eliquis for DVT which is tolerating well without bleeding or bruising.  Her blood pressure is usually well-controlled though today it is 148/86 office.  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  Last hemoglobin A1c was 8.8 and LDL cholesterol 95 mg percent on 01/29/2022.  She is on full dose Lipitor but has not yet tried Praluent or Repatha injections.  She is able to ambulate with a cane but prefers to use a wheelchair for long distances.  He had a fall in December 2023 and has gotten scared so she has been walking quite less since then.  She has completed participation in the sleep smart study but not yet being seen in sleep clinic to start treatment for her sleep apnea.   Initial visit 09/09/2021 Dr. Pearlean Brownie Stephanie [name] is a 73 year old pleasant African-American lady seen today for initial office follow-up visit following consultation for stroke in April 2023.  History is  obtained from the patient and review of electronic medical records and I have personally reviewed pertinent available imaging films in PACS.  She has past medical history of diabetes, hypertension, hyperlipidemia, obesity, gastroesophageal reflux disease, fibromyalgia, deep vein thrombosis on Eliquis, COPD, asthma and arthritis.  She presented on 05/17/2021 with sudden onset of left hand weakness.  She was seen by telestroke neurologist.  She was not a candidate for thrombolysis due to mild and resolving symptoms and presenting late.  MRI scan showed an acute right frontal precentral gyrus and subcortical white matter infarct as well as an additional tiny right insular subacute infarct felt to be of embolic cryptogenic etiology.  MR angiogram of the brain showed no large vessel stenosis or occlusion and carotid ultrasound showed no significant extracranial stenosis.  Transcranial Doppler bubble study was negative for PFO.  2D echo showed ejection fraction of 45 to 50% with no cardiac source of embolism.  LDL cholesterol was elevated at 166 mg percent and hemoglobin A1c at 8.3.  Telemetry monitoring did not reveal A-fib and subsequently outpatient 30-day heart monitor showed no evidence of paroxysmal A-fib either.  Patient was continued on Eliquis due to her history of deep vein thrombosis which is tolerating well without bruising or bleeding.  She has noticed improvement in the left hand weakness that she has only occasional diminished fine motor skills and has trouble grasping objects occasionally and may drop it.  She states her sugars are doing much better  fastings range between 70s to occasionally 180s.  Blood pressure has been fluctuating and today it is elevated at 159/82.  She is tolerating Lipitor well without side effects but has not had any follow-up lipid profile checked.  She is ambulating independently but needs to use a cane due to her chronic back pain.  She has had no recent falls or injuries.  The  patient participated in the sleep smart stroke prevention study and tested positive on the NOx 3 monitor for sleep apnea.  She was randomized to the medical treatment arm.  She has expressed interest to be referred to the sleep physician for consideration of using CPAP through her insurance.      ROS:   14 system review of systems is positive for hand weakness, back pain, difficulty walking, skin rash all other systems negative  PMH:  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   Type 2 diabetes mellitus (HCC)     Social History:  Social History   Socioeconomic History   Marital status: Married    Spouse name: Not on file   Number of children: Not on file   Years of education: Not on file   Highest education level: Not on file  Occupational History   Not on file  Tobacco Use   Smoking status: Former    Current packs/day: 0.00    Average packs/day: 0.3 packs/day for 30.0 years (7.5 ttl pk-yrs)    Types: Cigarettes    Start date: 02/16/1981    Quit date: 02/17/2011    Years since quitting: 12.0   Smokeless tobacco: Never  Vaping Use   Vaping status: Never Used  Substance and Sexual Activity   Alcohol use: Not Currently    Comment: rare   Drug use: No   Sexual activity: Not Currently    Birth control/protection: Surgical  Other Topics Concern   Not on file  Social History Narrative   Not on file   Social Drivers of Health   Financial Resource Strain: Not on file  Food Insecurity: No Food Insecurity  (01/08/2023)   Hunger Vital Sign    Worried About Running Out of Food in the Last Year: Never true    Ran Out of Food in the Last Year: Never true  Transportation Needs: No Transportation Needs (01/08/2023)   PRAPARE - Administrator, Civil Service (Medical): No    Lack of Transportation (Non-Medical): No  Physical Activity: Not on file  Stress: Not on file  Social Connections: Not on file  Intimate Partner Violence: Not At Risk (01/08/2023)   Humiliation, Afraid, Rape, and Kick questionnaire    Fear of Current or Ex-Partner: No    Emotionally Abused: No    Physically Abused: No    Sexually Abused: No    Medications:   Current Outpatient Medications on File Prior to Visit  Medication Sig Dispense Refill   Accu-Chek FastClix Lancets MISC Apply topically.  ACCU-CHEK GUIDE test strip 4 (four) times daily.     albuterol (PROVENTIL) (2.5 MG/3ML) 0.083% nebulizer solution Take 2.5 mg by nebulization every 6 (six) hours as needed for wheezing or shortness of breath.     albuterol (VENTOLIN HFA) 108 (90 Base) MCG/ACT inhaler Inhale 1-2 puffs into the lungs every 6 (six) hours as needed for wheezing or shortness of breath. 18 g 0   Alcohol Swabs (ALCOHOL PADS) 70 % PADS SMARTSIG:Pledget(s) Topical 4 Times Daily     apixaban (ELIQUIS) 5 MG TABS tablet Take 1 tablet (5 mg total) by mouth 2 (two) times daily.     Ascorbic Acid (VITAMIN C) 1000 MG tablet Take 500 mg by mouth daily.     atorvastatin (LIPITOR) 80 MG tablet Take 1 tablet (80 mg total) by mouth daily. 30 tablet 0   bisoprolol (ZEBETA) 10 MG tablet TAKE 1 TABLET(10 MG) BY MOUTH IN THE MORNING AND AT BEDTIME 180 tablet 3   Blood Glucose Calibration (ACCU-CHEK GUIDE CONTROL) LIQD See admin instructions.     Blood Glucose Monitoring Suppl (ACCU-CHEK GUIDE ME) w/Device KIT 4 (four) times daily.     Cetirizine HCl 10 MG CAPS Take 10 mg by mouth daily.     Cholecalciferol (VITAMIN D) 2000 units tablet Take 4,000 Units by  mouth daily.      cimetidine (TAGAMET) 200 MG tablet Take 0.5 tablets (100 mg total) by mouth daily as needed. (Patient taking differently: Take 100 mg by mouth daily as needed (indigestion).)     CINNAMON PO Take 1-2 capsules by mouth 3 (three) times daily.     clobetasol ointment (TEMOVATE) 0.05 % Apply 1 application topically 3 (three) times daily as needed (imflammation).     DULoxetine (CYMBALTA) 60 MG capsule Take 60 mg by mouth 2 (two) times daily.     EASY COMFORT PEN NEEDLES 31G X 5 MM MISC INJECT IN SULIN EVERY DAY AS DIRECTED     empagliflozin (JARDIANCE) 10 MG TABS tablet TAKE 1 TABLET(10 MG) BY MOUTH DAILY 30 tablet 0   fluticasone (VERAMYST) 27.5 MCG/SPRAY nasal spray Place 2 sprays into the nose daily.     guaiFENesin (ROBITUSSIN) 100 MG/5ML SOLN Take 5 mLs (100 mg total) by mouth every 4 (four) hours as needed for cough or to loosen phlegm. 236 mL 0   HUMALOG KWIKPEN 100 UNIT/ML KwikPen ADMINISTER 5 TO 11 UNITS UNDER THE SKIN THREE TIMES DAILY PER SLIDING SCALE 30 mL 3   hydrOXYzine (ATARAX/VISTARIL) 25 MG tablet Take 25 mg by mouth every 8 (eight) hours as needed for itching.      insulin degludec (TRESIBA FLEXTOUCH) 100 UNIT/ML FlexTouch Pen Inject 20 Units into the skin at bedtime. 18 mL 3   ipratropium-albuterol (DUONEB) 0.5-2.5 (3) MG/3ML SOLN Take 3 mLs by nebulization every 6 (six) hours as needed (shortness of breath).     Lancet Devices (EASY MINI EJECT LANCING DEVICE) MISC 4 (four) times daily.     levothyroxine (SYNTHROID) 100 MCG tablet Take 1 tablet (100 mcg total) by mouth daily before breakfast. 90 tablet 1   lidocaine (LIDODERM) 5 % Place 1 patch onto the skin daily. Remove & Discard patch within 12 hours or as directed by MD 30 patch 0   lubiprostone (AMITIZA) 24 MCG capsule Take 1 capsule (24 mcg total) by mouth 2 (two) times daily with a meal. (Patient taking differently: Take 24 mcg by mouth 2 (two) times daily as needed for constipation.) 60 capsule 11   LYRICA  75 MG capsule Take 75 mg by mouth 2 (two) times daily.      methocarbamol (ROBAXIN) 500 MG tablet Take 1 tablet (500 mg total) by mouth 3 (three) times daily as needed. (Patient taking differently: Take 500 mg by mouth 3 (three) times daily as needed for muscle spasms.) 60 tablet 1   montelukast (SINGULAIR) 10 MG tablet Take 10 mg by mouth at bedtime.     pantoprazole (PROTONIX) 40 MG tablet Take 1 tablet (40 mg total) by mouth daily before breakfast. 90 tablet 3   predniSONE (DELTASONE) 20 MG tablet Take 2 tablets by mouth daily x 2 days; then 1 tablet by mouth daily x 3 days; then half tablet by mouth daily x 3 days and stop prednisone. (Patient not taking: Reported on 01/21/2023) 9 tablet 0   REPATHA SURECLICK 140 MG/ML SOAJ ADMINISTER 1 ML UNDER THE SKIN EVERY 14 DAYS 2 mL 3   RESTASIS 0.05 % ophthalmic emulsion Place 1 drop into both eyes 2 (two) times daily.     telmisartan (MICARDIS) 20 MG tablet Take 40 mg by mouth daily.     tirzepatide (MOUNJARO) 10 MG/0.5ML Pen Inject 10 mg into the skin once a week. 6 mL 1   torsemide (DEMADEX) 20 MG tablet Take 2 tablets (40 mg total) by mouth daily. (Patient taking differently: Take 20 mg by mouth daily.) 60 tablet 0   traMADol (ULTRAM) 50 MG tablet Take 50 mg by mouth 3 (three) times daily as needed for moderate pain.  1   traZODone (DESYREL) 150 MG tablet Take 150 mg by mouth at bedtime.     ursodiol (ACTIGALL) 300 MG capsule Take 300 mg by mouth 2 (two) times daily.     vitamin B-12 (CYANOCOBALAMIN) 1000 MCG tablet Take 1,000 mcg by mouth daily.     No current facility-administered medications on file prior to visit.    Allergies:   Allergies  Allergen Reactions   Tetracyclines & Related Anaphylaxis and Rash   Banana Hives and Nausea And Vomiting   Penicillins Rash and Other (See Comments)    Physical Exam There were no vitals filed for this visit. There is no height or weight on file to calculate BMI.   General: Obese middle-aged  African-American lady, seated, in no evident distress Head: head normocephalic and atraumatic.  Neck: supple with no carotid or supraclavicular bruits Cardiovascular: regular rate and rhythm, no murmurs Musculoskeletal: no deformity Skin: Bilateral nodular rash on forearms and right leg  vascular:  Normal pulses all extremities  Neurologic Exam Mental Status: Awake and fully alert. Oriented to place and time. Recent and remote memory intact. Attention span, concentration and fund of knowledge appropriate. Mood and affect appropriate.  Cranial Nerves: Pupils equal, briskly reactive to light. Extraocular movements full without nystagmus. Visual fields full to confrontation. Hearing intact. Facial sensation intact. Face, tongue, palate moves normally and symmetrically.  Motor: Normal bulk and tone. Normal strength in all tested extremity muscles.  Diminished fine finger movements on the left.  Orbits right over left upper extremity. Sensory.: intact to touch ,pinprick .position and vibratory sensation.  Coordination: Rapid alternating movements normal in all extremities. Finger-to-nose and heel-to-shin performed accurately bilaterally. Gait and Station:  deferred as she is in wheel chair Reflexes: 1+ and symmetric. Toes downgoing.       ASSESSMENT: 73 year old African-American lady with cryptogenic right frontal MCA branch infarct and April 2023.  Done well with significant improvement.  Vascular risk factors of uncontrolled diabetes, hyperlipidemia, obesity,  hypertension and sleep apnea.      PLAN: I had a long d/w patient about her recent cryptogenic stroke, sleep apnea, risk for recurrent stroke/TIAs, personally independently reviewed imaging studies and stroke evaluation results and answered questions.Continue Eliquis (apixaban) daily  for secondary stroke prevention given her history of DVT and maintain strict control of hypertension with blood pressure goal below 130/90, diabetes with  hemoglobin A1c goal below 6.5% and lipids with LDL cholesterol goal below 70 mg/dL. I also advised the patient to eat a healthy diet with plenty of whole grains, cereals, fruits and vegetables, exercise regularly and maintain ideal body weight.  Check follow-up carotid ultrasound and start Repatha injections as LDL is suboptimally controlled on maximal dose of lipitor.  Referred to sleep clinic for further evaluation and treatment for his sleep apnea.     I spent *** minutes of face-to-face and non-face-to-face time with patient.  This included previsit chart review, lab review, study review, order entry, electronic health record documentation, patient education and discussion regarding above diagnoses and treatment plan and answered all other questions to patient's satisfaction  Ihor Austin, [facility]  [facility] [address] [place], [place] [address]  Phone [phone] Fax [phone] Note: This document was prepared with digital dictation and possible smart phrase technology. Any transcriptional errors that result from this process are unintentional.

## 2023-03-15 NOTE — Progress Notes (Incomplete)
[facility] [address], [place] [address]    Clinic Day:  03/15/2023  Referring physician: Marylynn Pearson, FNP  Patient Care Team: Marylynn Pearson, FNP as PCP - General (Family Medicine) Jonelle Sidle, MD as PCP - Cardiology (Cardiology) Jena Gauss Gerrit Friends, MD as Consulting Physician (Gastroenterology) Pearson Grippe, MD (Internal Medicine) Dani Gobble, NP as Nurse Practitioner (Nurse Practitioner)   ASSESSMENT & PLAN:   Assessment: 1.  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 - History of nephrotic range proteinuria, resolved now. - Skeletal survey (03/10/2022): Possible mottled lucency of the anterior aspect of the L1 vertebral body and mildly increased lucency of the inferior endplate of L2, requires additional dedicated cross-sectional imaging for better characterization CT lumbar spine (04/07/2022): Lytic lesion along superior endplate of L2 vertebral body and may be degenerative in etiology.  No definite suspicious lytic or blastic osseous lesions. MRI lumbar spine (05/08/2022) was negative for evidence of multiple myeloma, but showed multilevel Schmorl's nodes felt to account for lucency within the L2 superior endplate seen on CT, as well as mildly progressive multilevel spondylosis. - Most recent myeloma panel (07/09/2022) continues to show labs trending upwards: M-spike trending upwards at 2.0 (significantly elevated from M spike around 1.3 one year ago) Immunofixation PENDING Free light chains (last checked 03/10/2022) remain elevated but relatively stable with elevated kappa 790.4, normal lambda 19.0, and stable but elevated ratio 41.60. No CRAB features: Calcium 8.6.  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 Normal Hgb 12.5. LDH normal - Bone marrow biopsy (03/31/2022): Hypercellular  bone marrow for age with plasmacytosis (plasma cells 13% of all cells).  Minor B-cell population with kappa light chain excess.  Per pathology report, unclear whether findings represent lymphoid component of a neoplastic lymphoplasmacytic disorder or an independent disease process such as monoclonal B-cell lymphocytosis. Molecular pathology was negative for any abnormal mutations Cytogenetics showed normal female karyotype (46,XX[20]) MYD88 mutation detected [c.794T>C (p.L265P)] - this mutation favors diagnosis of lymphoplasmacytic lymphoma, and may indicate higher risk of disease progression and greater disease burden  - Reports increased low back pain with radiating pain and numbness/tingling going down her left leg. - Recent fever (Tmax 100.0 F) associated with acute bronchitis.  Drenching night sweats x 2 weeks in April 2024.  No shaking chills or unintentional weight loss.  Reports early satiety; no nausea or abdominal pain. - No lymphadenopathy or palpable splenomegaly on exam. - DISCUSSION: Based on most recent bone marrow biopsy results (13% plasma cells), patient now meets criteria for SMOLDERING MYELOMA presence of IgG kappa monoclonal gammopathy and absence of CRAB features.  However, clonal B-cell population and presence of MYD88 mutation show evidence of LYMPHOPLASMACYTIC LYMPHOMA (non-IgM).  Differential diagnosis includes rare cooccurrence of smoldering myeloma (IgG) AND lymphoplasmacytic lymphoma ** OR ** rare variant of WM with IgG secretion.   2.  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 - She is taking Eliquis without any major bleeding events        3.  Leukocytosis: - Intermittent leukocytosis since at least 2012 - Denies any chronic steroid use. - She is a non-smoker - BCR-ABL FISH is negative.  JAK2 with reflex is negative - Most recent labs (07/09/2022): WBC normal at 9.4 with normal differential   4.  Microcytic anemia,  RESOLVED - Previously known to  be iron deficient. - Last received IV iron on 06/25/2017 and 07/16/2017. - Not currently on iron supplements. - No bright red blood per rectum or melena - Most recent iron panel (03/10/2022): Ferritin 129, iron saturation 35% - Most recent CBC (07/09/2022) with Hgb 12.5    5. CKD stage IIIa - Renal biopsy (08/29/2021) consistent with kidney disease from diabetes and hypertension - Baseline creatinine 1.1-1.4   6.  Elevated liver enzymes: - Ultrasound abdomen on 04/25/2018 showed fatty liver. - She has chronically elevated AST, ALT, and alkaline phosphatase - Liver biopsy (10/15/2020): Minimally active steatohepatitis, mild portal and centrilobular fibrosis   Plan: 1.  IgG kappa smoldering myeloma/lymphoplasmacytic lymphoma: - She denies any fevers or weight loss.  No infections since last visit. - She has night sweats, drenching type since April 2024, once every 2 weeks. - Reviewed CT CAP from 09/21/2022: No evidence of lymphadenopathy or metastatic disease. - Labs from 10/26/2022: M spike stable at 2 g.  LDH is normal.  Creatinine 1.47 stable.  CBC was grossly normal.  Free kappa light chains are 896 with ratio 40.7.  Immunofixation shows IgG kappa. - 24-hour urine: Total protein is 278 mg.  Immunofixation positive for kappa type Bence-Jones protein. - I reviewed bone marrow biopsy results from 03/31/2022: Hypercellular marrow with plasma cells 13% displaying kappa light chain restriction.  Significant lymphoid aggregates/lymphocytes not seen.  Flow cytometry showed minor B-cell population representing 3% of cells with kappa light chain excess.  Lymphoid findings are atypical and similar to those seen on previous biopsy in 2017.  It is not clear if it is from component of lymphoplasmacytic lymphoma or smoldering myeloma with monoclonal B-cell lymphocytosis.  Multiple myeloma FISH panel was normal.  Cytogenetics was normal.  MYD 88 is positive, favoring lymphoplasmacytic  lymphoma. - No indication for treatment at this time. - Recommend follow-up in 4 months with repeat labs.  2.  Bilateral DVT: - Continue Eliquis.  No bleeding issues.   No orders of the defined types were placed in this encounter.     Alben Deeds Teague,acting as a Neurosurgeon for Doreatha Massed, MD.,have documented all relevant documentation on the behalf of Doreatha Massed, MD,as directed by  Doreatha Massed, MD while in the presence of Doreatha Massed, MD.  ***   Silt R Teague   1/27/20251:23 PM  CHIEF COMPLAINT:   Diagnosis: MGUS and history of DVT   Cancer Staging  No matching staging information was found for the patient.    Prior Therapy: none  Current Therapy:  eliquis   HISTORY OF PRESENT ILLNESS:   Oncology History   No history exists.     INTERVAL HISTORY:   Kambria is a 73 y.o. female presenting to clinic today for follow up of MGUS and history of DVT. She was last seen by me on 11/02/22.  Since her last visit, she was admitted to the hospital from 01/08/23 to 01/12/23 for acute on chronic diastolic CHF, treated with diuresis, as well as a Covid 19 infection.   Today, she states that she is doing well overall. Her appetite level is at ***%. Her energy level is at ***%.  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   Type 2 diabetes mellitus (HCC)     Surgical History: Past Surgical History:  Procedure Laterality Date    ABDOMINAL HYSTERECTOMY  partial   CARPAL TUNNEL RELEASE Right 1991   CATARACT EXTRACTION W/PHACO Right 05/08/2013   Procedure: CATARACT EXTRACTION PHACO AND INTRAOCULAR LENS PLACEMENT (IOC);  Surgeon: Gemma Payor, MD;  Location: [facility];  Service: Ophthalmology;  Laterality: Right;  CDE 10.31   CATARACT EXTRACTION W/PHACO Left 08/17/2013   Procedure: CATARACT EXTRACTION PHACO AND INTRAOCULAR LENS PLACEMENT (IOC);  Surgeon: Gemma Payor, MD;  Location: [facility];  Service: Ophthalmology;  Laterality: Left;  CDE:9.03   CHOLECYSTECTOMY  1971   COLONOSCOPY WITH PROPOFOL N/A 01/06/2016   Dr. Jena Gauss: diverticulosis    DENTAL SURGERY     ESOPHAGEAL BRUSHING  08/29/2019   Procedure: ESOPHAGEAL BRUSHING;  Surgeon: Corbin Ade, MD;  Location: [facility];  Service: Endoscopy;;   ESOPHAGOGASTRODUODENOSCOPY (EGD) WITH PROPOFOL N/A 01/06/2016   Dr. Jena Gauss: normal s/p empiric dilation    ESOPHAGOGASTRODUODENOSCOPY (EGD) WITH PROPOFOL N/A 08/29/2019   esophageal plaques vs medication residue adherent to tubular esophagus s/p KOH brushing and dilation. Medium-sized hiatal hernia. + for candida. Diflucan.    EYE SURGERY     IR BONE MARROW BIOPSY & ASPIRATION  03/31/2022   MALONEY DILATION N/A 01/06/2016   Procedure: Elease Hashimoto DILATION;  Surgeon: Corbin Ade, MD;  Location: [facility];  Service: Endoscopy;  Laterality: N/A;   MALONEY DILATION N/A 08/29/2019   Procedure: Elease Hashimoto DILATION;  Surgeon: Corbin Ade, MD;  Location: [facility];  Service: Endoscopy;  Laterality: N/A;   REVERSE SHOULDER ARTHROPLASTY Right 08/06/2017   Procedure: RIGHT REVERSE SHOULDER ARTHROPLASTY;  Surgeon: Beverely Low, MD;  Location: [facility] OR;  Service: Orthopedics;  Laterality: Right;   WRIST GANGLION EXCISION Left     Social History: Social History   Socioeconomic History   Marital status: Married    Spouse name: Not on file   Number of children: Not on file   Years of education: Not on file   Highest education level: Not  on file  Occupational History   Not on file  Tobacco Use   Smoking status: Former    Current packs/day: 0.00    Average packs/day: 0.3 packs/day for 30.0 years (7.5 ttl pk-yrs)    Types: Cigarettes    Start date: 02/16/1981    Quit date: 02/17/2011    Years since quitting: 12.0   Smokeless tobacco: Never  Vaping Use   Vaping status: Never Used  Substance and Sexual Activity   Alcohol use: Not Currently    Comment: rare   Drug use: No   Sexual activity: Not Currently    Birth control/protection: Surgical  Other Topics Concern   Not on file  Social History Narrative   Not on file   Social Drivers of Health   Financial Resource Strain: Not on file  Food Insecurity: No Food Insecurity (01/08/2023)   Hunger Vital Sign    Worried About Running Out of Food in the Last Year: Never true    Ran Out of Food in the Last Year: Never true  Transportation Needs: No Transportation Needs (01/08/2023)   PRAPARE - Transportation  Lack of Transportation (Medical): No    Lack of Transportation (Non-Medical): No  Physical Activity: Not on file  Stress: Not on file  Social Connections: Not on file  Intimate Partner Violence: Not At Risk (01/08/2023)   Humiliation, Afraid, Rape, and Kick questionnaire    Fear of Current or Ex-Partner: No    Emotionally Abused: No    Physically Abused: No    Sexually Abused: No    Family History: Family History  Problem Relation Age of Onset   Hypertension Mother    Diabetes Mother    COPD Mother    Arthritis Mother    Diabetes Father    Arthritis Father    Dementia Father    CAD Father    Hypothyroidism Sister    Diabetes Brother    Colon cancer Niece    Colon polyps Neg Hx    Sleep apnea Neg Hx     Current Medications:  Current Outpatient Medications:    Accu-Chek FastClix Lancets MISC, Apply topically., Disp: , Rfl:    ACCU-CHEK GUIDE test strip, 4 (four) times daily., Disp: , Rfl:    albuterol (PROVENTIL) (2.5 MG/3ML) 0.083% nebulizer  solution, Take 2.5 mg by nebulization every 6 (six) hours as needed for wheezing or shortness of breath., Disp: , Rfl:    albuterol (VENTOLIN HFA) 108 (90 Base) MCG/ACT inhaler, Inhale 1-2 puffs into the lungs every 6 (six) hours as needed for wheezing or shortness of breath., Disp: 18 g, Rfl: 0   Alcohol Swabs (ALCOHOL PADS) 70 % PADS, SMARTSIG:Pledget(s) Topical 4 Times Daily, Disp: , Rfl:    apixaban (ELIQUIS) 5 MG TABS tablet, Take 1 tablet (5 mg total) by mouth 2 (two) times daily., Disp: , Rfl:    Ascorbic Acid (VITAMIN C) 1000 MG tablet, Take 500 mg by mouth daily., Disp: , Rfl:    atorvastatin (LIPITOR) 80 MG tablet, Take 1 tablet (80 mg total) by mouth daily., Disp: 30 tablet, Rfl: 0   bisoprolol (ZEBETA) 10 MG tablet, TAKE 1 TABLET(10 MG) BY MOUTH IN THE MORNING AND AT BEDTIME, Disp: 180 tablet, Rfl: 3   Blood Glucose Calibration (ACCU-CHEK GUIDE CONTROL) LIQD, See admin instructions., Disp: , Rfl:    Blood Glucose Monitoring Suppl (ACCU-CHEK GUIDE ME) w/Device KIT, 4 (four) times daily., Disp: , Rfl:    Cetirizine HCl 10 MG CAPS, Take 10 mg by mouth daily., Disp: , Rfl:    Cholecalciferol (VITAMIN D) 2000 units tablet, Take 4,000 Units by mouth daily. , Disp: , Rfl:    cimetidine (TAGAMET) 200 MG tablet, Take 0.5 tablets (100 mg total) by mouth daily as needed. (Patient taking differently: Take 100 mg by mouth daily as needed (indigestion).), Disp: , Rfl:    CINNAMON PO, Take 1-2 capsules by mouth 3 (three) times daily., Disp: , Rfl:    clobetasol ointment (TEMOVATE) 0.05 %, Apply 1 application topically 3 (three) times daily as needed (imflammation)., Disp: , Rfl:    DULoxetine (CYMBALTA) 60 MG capsule, Take 60 mg by mouth 2 (two) times daily., Disp: , Rfl:    EASY COMFORT PEN NEEDLES 31G X 5 MM MISC, INJECT IN SULIN EVERY DAY AS DIRECTED, Disp: , Rfl:    empagliflozin (JARDIANCE) 10 MG TABS tablet, TAKE 1 TABLET(10 MG) BY MOUTH DAILY, Disp: 30 tablet, Rfl: 0   fluticasone (VERAMYST)  27.5 MCG/SPRAY nasal spray, Place 2 sprays into the nose daily., Disp: , Rfl:    guaiFENesin (ROBITUSSIN) 100 MG/5ML SOLN, Take 5 mLs (100 mg  total) by mouth every 4 (four) hours as needed for cough or to loosen phlegm., Disp: 236 mL, Rfl: 0   HUMALOG KWIKPEN 100 UNIT/ML KwikPen, ADMINISTER 5 TO 11 UNITS UNDER THE SKIN THREE TIMES DAILY PER SLIDING SCALE, Disp: 30 mL, Rfl: 3   hydrOXYzine (ATARAX/VISTARIL) 25 MG tablet, Take 25 mg by mouth every 8 (eight) hours as needed for itching. , Disp: , Rfl:    insulin degludec (TRESIBA FLEXTOUCH) 100 UNIT/ML FlexTouch Pen, Inject 20 Units into the skin at bedtime., Disp: 18 mL, Rfl: 3   ipratropium-albuterol (DUONEB) 0.5-2.5 (3) MG/3ML SOLN, Take 3 mLs by nebulization every 6 (six) hours as needed (shortness of breath)., Disp: , Rfl:    Lancet Devices (EASY MINI EJECT LANCING DEVICE) MISC, 4 (four) times daily., Disp: , Rfl:    levothyroxine (SYNTHROID) 100 MCG tablet, Take 1 tablet (100 mcg total) by mouth daily before breakfast., Disp: 90 tablet, Rfl: 1   lidocaine (LIDODERM) 5 %, Place 1 patch onto the skin daily. Remove & Discard patch within 12 hours or as directed by MD, Disp: 30 patch, Rfl: 0   lubiprostone (AMITIZA) 24 MCG capsule, Take 1 capsule (24 mcg total) by mouth 2 (two) times daily with a meal. (Patient taking differently: Take 24 mcg by mouth 2 (two) times daily as needed for constipation.), Disp: 60 capsule, Rfl: 11   LYRICA 75 MG capsule, Take 75 mg by mouth 2 (two) times daily. , Disp: , Rfl:    methocarbamol (ROBAXIN) 500 MG tablet, Take 1 tablet (500 mg total) by mouth 3 (three) times daily as needed. (Patient taking differently: Take 500 mg by mouth 3 (three) times daily as needed for muscle spasms.), Disp: 60 tablet, Rfl: 1   montelukast (SINGULAIR) 10 MG tablet, Take 10 mg by mouth at bedtime., Disp: , Rfl:    pantoprazole (PROTONIX) 40 MG tablet, Take 1 tablet (40 mg total) by mouth daily before breakfast., Disp: 90 tablet, Rfl: 3    predniSONE (DELTASONE) 20 MG tablet, Take 2 tablets by mouth daily x 2 days; then 1 tablet by mouth daily x 3 days; then half tablet by mouth daily x 3 days and stop prednisone. (Patient not taking: Reported on 01/21/2023), Disp: 9 tablet, Rfl: 0   REPATHA SURECLICK 140 MG/ML SOAJ, ADMINISTER 1 ML UNDER THE SKIN EVERY 14 DAYS, Disp: 2 mL, Rfl: 3   RESTASIS 0.05 % ophthalmic emulsion, Place 1 drop into both eyes 2 (two) times daily., Disp: , Rfl:    telmisartan (MICARDIS) 20 MG tablet, Take 40 mg by mouth daily., Disp: , Rfl:    tirzepatide (MOUNJARO) 10 MG/0.5ML Pen, Inject 10 mg into the skin once a week., Disp: 6 mL, Rfl: 1   torsemide (DEMADEX) 20 MG tablet, Take 2 tablets (40 mg total) by mouth daily. (Patient taking differently: Take 20 mg by mouth daily.), Disp: 60 tablet, Rfl: 0   traMADol (ULTRAM) 50 MG tablet, Take 50 mg by mouth 3 (three) times daily as needed for moderate pain., Disp: , Rfl: 1   traZODone (DESYREL) 150 MG tablet, Take 150 mg by mouth at bedtime., Disp: , Rfl:    ursodiol (ACTIGALL) 300 MG capsule, Take 300 mg by mouth 2 (two) times daily., Disp: , Rfl:    vitamin B-12 (CYANOCOBALAMIN) 1000 MCG tablet, Take 1,000 mcg by mouth daily., Disp: , Rfl:    Allergies: Allergies  Allergen Reactions   Tetracyclines & Related Anaphylaxis and Rash   Banana Hives and Nausea And  Vomiting   Penicillins Rash and Other (See Comments)    REVIEW OF SYSTEMS:   Review of Systems  Constitutional:  Negative for chills, fatigue and fever.  HENT:   Negative for lump/mass, mouth sores, nosebleeds, sore throat and trouble swallowing.   Eyes:  Negative for eye problems.  Respiratory:  Negative for cough and shortness of breath.   Cardiovascular:  Negative for chest pain, leg swelling and palpitations.  Gastrointestinal:  Negative for abdominal pain, constipation, diarrhea, nausea and vomiting.  Genitourinary:  Negative for bladder incontinence, difficulty urinating, dysuria, frequency,  hematuria and nocturia.   Musculoskeletal:  Negative for arthralgias, back pain, flank pain, myalgias and neck pain.  Skin:  Negative for itching and rash.  Neurological:  Negative for dizziness, headaches and numbness.  Hematological:  Does not bruise/bleed easily.  Psychiatric/Behavioral:  Negative for depression, sleep disturbance and suicidal ideas. The patient is not nervous/anxious.   All other systems reviewed and are negative.    VITALS:   There were no vitals taken for this visit.  Wt Readings from Last 3 Encounters:  01/21/23 222 lb (100.7 kg)  01/11/23 226 lb 13.7 oz (102.9 kg)  11/02/22 226 lb (102.5 kg)    There is no height or weight on file to calculate BMI.  Performance status (ECOG): 1 - Symptomatic but completely ambulatory  PHYSICAL EXAM:   Physical Exam Vitals and nursing note reviewed. Exam conducted with a chaperone present.  Constitutional:      Appearance: Normal appearance.  Cardiovascular:     Rate and Rhythm: Normal rate and regular rhythm.     Pulses: Normal pulses.     Heart sounds: Normal heart sounds.  Pulmonary:     Effort: Pulmonary effort is normal.     Breath sounds: Normal breath sounds.  Abdominal:     Palpations: Abdomen is soft. There is no hepatomegaly, splenomegaly or mass.     Tenderness: There is no abdominal tenderness.  Musculoskeletal:     Right lower leg: No edema.     Left lower leg: No edema.  Lymphadenopathy:     Cervical: No cervical adenopathy.     Right cervical: No superficial, deep or posterior cervical adenopathy.    Left cervical: No superficial, deep or posterior cervical adenopathy.     Upper Body:     Right upper body: No supraclavicular or axillary adenopathy.     Left upper body: No supraclavicular or axillary adenopathy.  Neurological:     General: No focal deficit present.     Mental Status: She is alert and oriented to person, place, and time.  Psychiatric:        Mood and Affect: Mood normal.         Behavior: Behavior normal.    LABS:      Latest Ref Rng & Units 03/04/2023   11:03 AM 01/10/2023    4:59 AM 01/09/2023    5:00 AM  CBC  WBC 4.0 - 10.5 K/uL 8.9  7.3  9.3   Hemoglobin 12.0 - 15.0 g/dL 40.1  02.7  25.3   Hematocrit 36.0 - 46.0 % 34.8  35.1  35.9   Platelets 150 - 400 K/uL 240  187  205       Latest Ref Rng & Units 03/04/2023   11:03 AM 02/11/2023    2:45 PM 01/11/2023    4:26 AM  CMP  Glucose 70 - 99 mg/dL 664  403  474   BUN 8 - 23 mg/dL  19  13  31    Creatinine 0.44 - 1.00 mg/dL 1.61  0.96  0.45   Sodium 135 - 145 mmol/L 137  136  130   Potassium 3.5 - 5.1 mmol/L 3.6  3.7  4.3   Chloride 98 - 111 mmol/L 104  102  97   CO2 22 - 32 mmol/L 24  24  23    Calcium 8.9 - 10.3 mg/dL 8.8  8.4  7.8   Total Protein 6.5 - 8.1 g/dL 8.0     Total Bilirubin 0.0 - 1.2 mg/dL 0.5     Alkaline Phos 38 - 126 U/L 155     AST 15 - 41 U/L 20     ALT 0 - 44 U/L 13        No results found for: "CEA1", "CEA" / No results found for: "CEA1", "CEA" No results found for: "PSA1" No results found for: "WUJ811" No results found for: "CAN125"  Lab Results  Component Value Date   TOTALPROTELP 7.5 03/04/2023   ALBUMINELP 2.6 (L) 03/04/2023   A1GS 0.3 03/04/2023   A2GS 1.0 03/04/2023   BETS 1.2 03/04/2023   GAMS 2.5 (H) 03/04/2023   MSPIKE 1.7 (H) 03/04/2023   SPEI Comment 03/04/2023   Lab Results  Component Value Date   TIBC 274 03/10/2022   TIBC 295 10/08/2021   TIBC 291 05/12/2021   FERRITIN 129 03/10/2022   FERRITIN 112 10/16/2021   FERRITIN 81 05/12/2021   IRONPCTSAT 35 (H) 03/10/2022   IRONPCTSAT 28 10/08/2021   IRONPCTSAT 19 05/12/2021   Lab Results  Component Value Date   LDH 195 (H) 03/04/2023   LDH 139 10/26/2022   LDH 184 07/09/2022     STUDIES:   No results found.

## 2023-03-16 ENCOUNTER — Encounter: Payer: Self-pay | Admitting: Adult Health

## 2023-03-16 ENCOUNTER — Ambulatory Visit (INDEPENDENT_AMBULATORY_CARE_PROVIDER_SITE_OTHER): Payer: 59 | Admitting: Adult Health

## 2023-03-16 ENCOUNTER — Inpatient Hospital Stay: Payer: 59 | Admitting: Hematology

## 2023-03-16 VITALS — BP 119/65 | HR 74 | Ht 68.0 in | Wt 229.0 lb

## 2023-03-16 DIAGNOSIS — I639 Cerebral infarction, unspecified: Secondary | ICD-10-CM | POA: Diagnosis not present

## 2023-03-16 DIAGNOSIS — G4733 Obstructive sleep apnea (adult) (pediatric): Secondary | ICD-10-CM

## 2023-03-16 DIAGNOSIS — E785 Hyperlipidemia, unspecified: Secondary | ICD-10-CM

## 2023-03-16 NOTE — Patient Instructions (Addendum)
Your Plan:  Restart nightly use of CPAP for adequate sleep apnea management  Continue to follow with your DME for any needed supplies or CPAP related concerns  Continue Eliquis, atorvastatin, Zetia and Repatha  Continue close follow-up with PCP for aggressive stroke risk factor management     Follow up in 6 months or call earlier if needed     Thank you for coming to see [place] at [facility]. I hope we have been able to provide you high quality care today.  You may receive a patient satisfaction survey over the next few weeks. We would appreciate your feedback and comments so that we may continue to improve ourselves and the health of our patients.

## 2023-03-17 ENCOUNTER — Other Ambulatory Visit: Payer: Self-pay | Admitting: Adult Health

## 2023-03-17 LAB — LIPID PANEL
Chol/HDL Ratio: 3.1 {ratio} (ref 0.0–4.4)
Cholesterol, Total: 186 mg/dL (ref 100–199)
HDL: 60 mg/dL (ref >39)
LDL Chol Calc (NIH): 109 mg/dL — ABNORMAL HIGH (ref 0–99)
Triglycerides: 91 mg/dL (ref 0–149)
VLDL Cholesterol Cal: 17 mg/dL (ref 5–40)

## 2023-03-18 ENCOUNTER — Encounter (HOSPITAL_COMMUNITY): Payer: Self-pay | Admitting: Hematology

## 2023-03-18 ENCOUNTER — Other Ambulatory Visit: Payer: Self-pay | Admitting: Neurology

## 2023-03-18 MED ORDER — REPATHA SURECLICK 140 MG/ML ~~LOC~~ SOAJ: 140.0000 mg | SUBCUTANEOUS | 3 refills | Status: AC

## 2023-03-18 NOTE — Progress Notes (Signed)
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 Repeat CXR in 6 weeks to assure complete resolution of infiltrates.     Discharge Diagnoses:  Principal Problem:   Acute respiratory disease due to COVID-19 virus   Hypertension   COPD (chronic obstructive pulmonary disease) (HCC)   MGUS (monoclonal gammopathy of unknown significance)   Pneumonia due to COVID-19 virus   COVID-19   Chronic diastolic CHF (congestive heart failure) (HCC)     History of present illness:  Stephanie [name] is a 73 y.o. female with medical history significant of Covid pneumonia just discharged today to home from the hospital after completing remdesivir on steroids.  Patient normally is on 2 L of oxygen and was discharged on 6 L with exertion but at rest was only requiring 4 L.  She was transferred by ambulance home by the time she got home she was extremely short of breath and hypoxic.  Patient was readmitted for acute hypoxemic respiratory failure in the setting of Covid pneumonia.  Pulmonology following.   Hospital Course:  Acute hypoxemic respiratory failure secondary to COVID-19 pneumonia -Continue steroids and wean off O2 supplementation as tolerated.    -currently using 2L at rest and 3.5-4L on exertion -continue slow steroids tapering. -Procalcitonin low, not needing antibiotics currently -Continue breathing  treatments as needed -Appreciate pulmonology consultation and rec's   Acute COPD exacerbation related to above -IV steroids and breathing treatments as noted above -no wheezing on exam.   Hypokalemia -Repleted -safe to resume diuretics and daily supplementation. -follow electrolytes trend   Type 2 diabetes with hyperglycemia-improved -Resume home adjusted hypoglycemic regimen -advise to follow low carb diet. -Recent A1c of 9.2% -expecting improvement in her CBG's while tapering off steroids.    CKD stage IIIa with AKI -Improved and back to baseline at discharge -Safe to resume the use of metolazone and Demadex -Continue to maintain adequate hydration -Repeat basic metabolic panel at follow-up visit to assess electrolytes and renal function and stability.   Hypertension -stable -follow heart healthy diet. -Resume home antihypertensive agents.   Hypothyroidism -Continue Synthroid   Class 1 Obesity -low calorie diet, portion control and increase physical activity recommended.  -Body mass index is 33.89 kg/m.   Chronic diastolic heart failure -Stable and compensated -Continue to follow daily weights, low-sodium diet and resume home diuretic regimen.       History of Present Illness  06/18/2020  Pulmonary/ 1st office eval/ Stephanie [name] / [facility] Office  Chief Complaint  Patient presents with   Pulmonary Consult    Referred by Dr Sherryll Burger.  Pt had covid 06 Mar 2020- admitted to [facility] and sent home with supplemental o2. She gets winded with exertion such as cooking and cleaning her home. She uses her albuterol inhaler 2 x daily on average.   Dyspnea:  Room to room at home / scooter for shopping due to back > breathing on 2lpm  Cough: worse since covid / prednisone rx per PCP x 4 more days Sleep: flat bed with 3 pillows worse cough  SABA use: twice daily as above   rec Stop metaprolol  Bisoprolol 5 mg twice daily - call med to fine tune it.  Change pantoprazole 40 mg Take 30- 60  min before your first and last meals of the day  GERD Only use your albuterol as a rescue medication  Make sure you check your oxygen saturation  at your highest level of activity  to be sure it stays over 90%   Please schedule a follow up office visit in 2 weeks, call sooner if needed with all medications /inhalers/ solutions in hand      PFT's  09/17/20  FEV1 2.25 (102 % ) ratio 0.85 p 0 % improvement from saba (off anoro) prior to study with DLCO  11.63 (52%) corrects to 3.20 (78%)  for alv volume and FV curve flattened top of ext curve  erv not done,  needs cxr to correlate lung vol low       05/27/2022  f/u ov/[place] office/Stephanie [name] re: resp failure s/p covid Jan 2022  maint on 2lpm prn and arnuity  Chief Complaint  Patient presents with   Follow-up    Pt f/u states that her breathing has been "so-so", she has had increased SOB & coughing. Only using O2 while @ home   Dyspnea:  leans on shopping cart but does scooter most of the time due to back spasms- even before covid she was mostly limited by back  Has rollator not using / also y membership but doesn't know what kind of ex to try for her back problems and hasn't discussed with her back specialist yet  Cough: minimal Sleeping: most nights does fine on bed blocks 2 pillows  SABA use: only uses p exertion, never prechallenges or rechallenges  02: completely prn basis, even at hs = 2lpm Rec Ok to try albuterol 15 min before an activity (on alternating days)  that you know would usually make you short of breath  Make sure you check your oxygen saturation at your highest level of activity(NOT after you stop)  to be sure it stays over 90%  Pulmonary follow up is as needed   Admit Nov 22 24 [facility] p covid d/c on 02 3- 3.5 lpm   03/19/2023    re-establish post covid  ov/[place] office/Stephanie [name] re: resp failure post coovid  maint on multiple saba's  Chief Complaint  Patient presents with   Follow-up  Dyspnea:  limited by arthritis  f/b  [place]  Cough: has spell twice daily > varies color but always thick  Sleeping: on side flat bed two pillows  s  resp cc  SABA use: combivent bid /albterol inhaler/ and then duoneb  02: 02 2 lpm hs and sometimes needs 3lpm during the day with sats 84% at rest.     No obvious day to day or daytime variability or assoc excess/ purulent sputum or mucus plugs or hemoptysis or cp or chest tightness, subjective wheeze or overt sinus or hb symptoms.    Also denies any obvious fluctuation of symptoms with weather or environmental changes or other aggravating or alleviating factors except as outlined above   No unusual exposure hx or h/o childhood pna/ asthma or knowledge of premature birth.  Current Allergies, Complete Past Medical History, Past Surgical History, Family History, and Social History were reviewed in [employer] record.  ROS  The following are not active complaints unless bolded Hoarseness, sore throat, dysphagia, dental problems, itching, sneezing,  nasal congestion or discharge of excess mucus or purulent secretions, ear ache,   fever, chills, sweats, unintended wt loss or wt gain, classically pleuritic or exertional cp,  orthopnea pnd or arm/hand swelling  or leg swelling, presyncope, palpitations, abdominal pain, anorexia, nausea, vomiting, diarrhea  or change in bowel habits or change in bladder habits, change in stools or change in urine, dysuria, hematuria,  rash, arthralgias, visual complaints, headache, numbness, weakness or ataxia or problems with walking or coordination,  change in mood or  memory.        Current Meds  Medication Sig   Accu-Chek FastClix Lancets MISC Apply topically.   ACCU-CHEK GUIDE test strip 4 (four) times daily.   albuterol (PROVENTIL) (2.5 MG/3ML) 0.083% nebulizer solution Take 2.5 mg by nebulization every 6 (six) hours as needed for wheezing or shortness of breath.   albuterol (VENTOLIN HFA) 108 (90 Base) MCG/ACT inhaler Inhale  1-2 puffs into the lungs every 6 (six) hours as needed for wheezing or shortness of breath.   Alcohol Swabs (ALCOHOL PADS) 70 % PADS SMARTSIG:Pledget(s) Topical 4 Times Daily   apixaban (ELIQUIS) 5 MG TABS tablet Take 1 tablet (5 mg total) by mouth 2 (two) times daily.   Ascorbic Acid (VITAMIN C) 1000 MG tablet Take 500 mg by mouth daily.   atorvastatin (LIPITOR) 80 MG tablet Take 1 tablet (80 mg total) by mouth daily.   bisoprolol (ZEBETA) 10 MG tablet TAKE 1 TABLET(10 MG) BY MOUTH IN THE MORNING AND AT BEDTIME   Blood Glucose Calibration (ACCU-CHEK GUIDE CONTROL) LIQD See admin instructions.   Blood Glucose Monitoring Suppl (ACCU-CHEK GUIDE ME) w/Device KIT 4 (four) times daily.   Cetirizine HCl 10 MG CAPS Take 10 mg by mouth daily.   Cholecalciferol (VITAMIN D) 2000 units tablet Take 4,000 Units by mouth daily.    cimetidine (TAGAMET) 200 MG tablet Take 0.5 tablets (100 mg total) by mouth daily as needed. (Patient taking differently: Take 100 mg by mouth daily as needed (indigestion).)   CINNAMON PO Take 1-2 capsules by mouth 3 (three) times daily.   clobetasol ointment (TEMOVATE) 0.05 % Apply 1 application  topically as needed (imflammation).   COMBIVENT RESPIMAT 20-100 MCG/ACT AERS respimat Inhale 1 puff 4 times a day by inhalation route, for COPD.   DULoxetine (CYMBALTA) 60 MG capsule Take 60 mg by mouth 2 (two) times daily.   EASY COMFORT PEN NEEDLES 31G X 5 MM MISC INJECT IN SULIN EVERY DAY AS DIRECTED   empagliflozin (JARDIANCE) 10 MG TABS tablet TAKE 1 TABLET(10 MG) BY MOUTH DAILY   Evolocumab (REPATHA SURECLICK) 140 MG/ML SOAJ Inject 140 mg into the skin every 14 (fourteen) days.   fluticasone (VERAMYST) 27.5 MCG/SPRAY nasal spray Place 2 sprays into the nose daily.   guaiFENesin (ROBITUSSIN) 100 MG/5ML SOLN Take 5 mLs (100 mg total) by mouth every 4 (four) hours as needed for cough or to loosen phlegm. (Patient taking differently: Take 5 mLs by mouth as needed for cough or to loosen  phlegm.)   HUMALOG KWIKPEN 100 UNIT/ML KwikPen ADMINISTER 5 TO 11 UNITS UNDER THE SKIN THREE TIMES DAILY PER SLIDING SCALE   hydrOXYzine (ATARAX/VISTARIL) 25 MG tablet Take 25 mg by mouth every 8 (eight) hours as needed for itching.    insulin degludec (TRESIBA FLEXTOUCH) 100 UNIT/ML FlexTouch Pen Inject 20 Units into the skin at bedtime.   ipratropium-albuterol (DUONEB) 0.5-2.5 (3)  MG/3ML SOLN Take 3 mLs by nebulization every 6 (six) hours as needed (shortness of breath).   Lancet Devices (EASY MINI EJECT LANCING DEVICE) MISC 4 (four) times daily.   levothyroxine (SYNTHROID) 100 MCG tablet Take 1 tablet (100 mcg total) by mouth daily before breakfast.   lidocaine (LIDODERM) 5 % Place 1 patch onto the skin daily. Remove & Discard patch within 12 hours or as directed by MD   lubiprostone (AMITIZA) 24 MCG capsule Take 1 capsule (24 mcg total) by mouth 2 (two) times daily with a meal. (Patient taking differently: Take 24 mcg by mouth 2 (two) times daily as needed for constipation.)   LYRICA 75 MG capsule Take 75 mg by mouth 2 (two) times daily.    methocarbamol (ROBAXIN) 500 MG tablet Take 1 tablet (500 mg total) by mouth 3 (three) times daily as needed. (Patient taking differently: Take 500 mg by mouth 3 (three) times daily as needed for muscle spasms.)   montelukast (SINGULAIR) 10 MG tablet Take 10 mg by mouth at bedtime.   pantoprazole (PROTONIX) 40 MG tablet Take 1 tablet (40 mg total) by mouth daily before breakfast.   RESTASIS 0.05 % ophthalmic emulsion Place 1 drop into both eyes 2 (two) times daily.   telmisartan (MICARDIS) 20 MG tablet Take 40 mg by mouth daily.   tirzepatide (MOUNJARO) 10 MG/0.5ML Pen Inject 10 mg into the skin once a week.   torsemide (DEMADEX) 20 MG tablet Take 2 tablets (40 mg total) by mouth daily. (Patient taking differently: Take 20 mg by mouth daily.)   traMADol (ULTRAM) 50 MG tablet Take 50 mg by mouth 3 (three) times daily as needed for moderate pain.   traZODone  (DESYREL) 150 MG tablet Take 150 mg by mouth at bedtime.   ursodiol (ACTIGALL) 300 MG capsule Take 300 mg by mouth 2 (two) times daily.   vitamin B-12 (CYANOCOBALAMIN) 1000 MCG tablet Take 1,000 mcg by mouth daily.                 Past Medical History:  Diagnosis Date   Anemia    Arthritis    Asthma    COPD (chronic obstructive pulmonary disease) (HCC)    Deep vein thrombosis (DVT) of both lower extremities (HCC) 06/27/2015   Diabetes mellitus    Fibromyalgia    GERD (gastroesophageal reflux disease)    H/O hiatal hernia    Hypercholesteremia    Hypertension    Hyperthyroidism    IBS (irritable bowel syndrome)    Inner ear disease    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   PONV (postoperative nausea and vomiting)    Tachycardia        Objective:   Wts  03/19/2023       228  05/27/2022       230   01/30/2021     228  07/02/2020       228   06/25/20 226 lb (102.5 kg)  06/18/20 223 lb (101.2 kg)  03/18/20 222 lb 14.2 oz (101.1 kg)      Vital signs reviewed  03/19/2023  - Note at rest 02 sats  93% on 2lpm cont    General appearance:    w/c bound elderly bf nad at rest    HEENT : Oropharynx  clear         NECK :  without  apparent JVD/ palpable Nodes/TM    LUNGS: no acc muscle use,  Nl contour chest which is clear  to A and P bilaterally without cough on insp or exp maneuvers   CV:  RRR  no s3 or murmur or increase in P2, and no edema   ABD: obese  soft and nontender   MS:    ext warm without deformities Or obvious joint restrictions  calf tenderness, cyanosis or clubbing    SKIN: warm and dry without lesions    NEURO:  alert, approp, nl sensorium with  no motor or cerebellar deficits apparent.         I personally reviewed images and agree with radiology impression as follows:   Chest CT w contrast 01/08/23     The central tracheo-bronchial tree is patent. Small left and moderate right pleural effusion noted with  associated compressive atelectatic changes in the right lung lower lobe. There are bilateral peripheral/subpleural reticulations/scarring noted. No mass, consolidation or pneumothorax. No pulmonary edema. No suspicious lung nodules.        Assessment

## 2023-03-18 NOTE — Telephone Encounter (Signed)
Just FYI - as you suspected, patient has not been on Repatha over the past several months despite her reporting compliance at recent visit. New refill sent to her pharmacy to get restarted.  Thank you for your assistance with this!

## 2023-03-18 NOTE — Telephone Encounter (Signed)
-----   Message from Ihor Austin sent at 03/17/2023  9:09 AM EST ----- Please call patients pharmacy and see when she last filled Repatha, atorvastatin and Zetia and how many days these were refilled for.  LDL increased compared to prior level but patient reported compliance at yesterday's visit, need to ensure she is compliant with medications.

## 2023-03-18 NOTE — Telephone Encounter (Signed)
Called the pharmacy. Pt is compliant with atorvastatin. She picked up a 90 day supply for this 02/11/2023.  When asked when last time Repatha was picked up she states she received a month supply and the last time it was picked up was 11/06/2022. She is unsure why the patient didn't continue to get the medication. I will call and discuss further with the patient.   Called the patient and asked about the repatha, she states she forgot to get the repatha refilled when she got covid. Advised the importance of restarting the medication. I will send a updated script to the pharmacy for her.she verbalized understanding.

## 2023-03-19 ENCOUNTER — Ambulatory Visit (INDEPENDENT_AMBULATORY_CARE_PROVIDER_SITE_OTHER): Payer: 59 | Admitting: Internal Medicine

## 2023-03-19 ENCOUNTER — Encounter: Payer: Self-pay | Admitting: Internal Medicine

## 2023-03-19 VITALS — BP 149/83 | HR 87 | Ht 68.0 in | Wt 228.8 lb

## 2023-03-19 DIAGNOSIS — J9611 Chronic respiratory failure with hypoxia: Secondary | ICD-10-CM | POA: Diagnosis not present

## 2023-03-19 DIAGNOSIS — R0609 Other forms of dyspnea: Secondary | ICD-10-CM

## 2023-03-19 MED ORDER — STIOLTO RESPIMAT 2.5-2.5 MCG/ACT IN AERS: 2.0000 | INHALATION_SPRAY | Freq: Every day | RESPIRATORY_TRACT | 11 refills | Status: DC

## 2023-03-19 MED ORDER — STIOLTO RESPIMAT 2.5-2.5 MCG/ACT IN AERS: 2.0000 | INHALATION_SPRAY | Freq: Every day | RESPIRATORY_TRACT | Status: DC

## 2023-03-19 NOTE — Assessment & Plan Note (Addendum)
Quit smoking 2015  -  Echo 03/16/20  GI  diastolic dysfunction only  -  02/21/1094   Walked    approx   100 ft  @ slow pace  stopped due to  Sob with sats 99% on 2lpm    -  Repeat echo 06/28/20  Low nl EF, diastolic dysfunction, mild RV dysfunction  - PFT's  09/17/20  FEV1 2.25 (102 % ) ratio 0.85 p 0 % improvement from saba p anoro prior to study with DLCO  11.63 (52%) corrects to 3.20 (78%)  for alv volume and FV curve flattened top of ext curve   - 01/30/2021   Walked on RA  x  One   lap(s) =  approx 250  ft  @ slow pace, stopped due to leg pain with lowest 02 sats 94%  - 05/27/2022   Walked on RA  x  1  lap(s) =  approx 150  ft  @ slow/cane pace, stopped due to legs/balance gave out  with lowest 02 sats 98%   - 03/19/2023  After extensive coaching inhaler device,  effectiveness =    75% with smi so try stiolto 2 each am and reduce multiple sabas see avs for instructions unique to this ov   She really doesn't have copd but way over using saba due to Dethlefs s/p covid 19 with main issues being obesity and severe deconditioning/ limited by R knee and now w/c bound.  Re SABA :  I spent extra time with pt today reviewing appropriate use of albuterol for prn use on exertion with the following points: 1) saba is for relief of sob that does not improve by walking a slower pace or resting but rather if the pt does not improve after trying this first. 2) If the pt is convinced, as many are, that saba helps recover from activity faster then it's easy to tell if this is the case by re-challenging : ie stop, take the inhaler, then p 5 minutes try the exact same activity (intensity of workload) that just caused the symptoms and see if they are substantially diminished or not after saba 3) if there is an activity that reproducibly causes the symptoms, try the saba 15 min before the activity on alternate days   If in fact the saba really does help, then fine to continue to use it prn but advised may need to look closer at  the maintenance regimen(for now just stiolto)  being used to achieve better control of airways disease with exertion.

## 2023-03-19 NOTE — Assessment & Plan Note (Signed)
Admit Nov 22 24 [facility] p covid d/c on 02 3- 3.5 lpm  Rec titrate to sats > 90% at home   >>> referred to [facility] for best fit form portable 02          Each maintenance medication was reviewed in detail including emphasizing most importantly the difference between maintenance and prns and under what circumstances the prns are to be triggered using an action plan format where appropriate.  Total time for H and P, chart review, counseling, reviewing hfa/smi/neb/ 02/ pulse ox  device(s) and generating customized AVS unique to this office visit / same day charting  > 30 min post hosp f/u

## 2023-03-19 NOTE — Patient Instructions (Addendum)
Plan A = Automatic = Always=    Stiolto 2 pffs  each am   Plan B = Backup (to supplement plan A, not to replace it) Only use your albuterol inhaler as a rescue medication to be used if you can't catch your breath by resting or doing a relaxed purse lip breathing pattern.  - The less you use it, the better it will work when you need it. - Ok to use the inhaler up to 2 puffs  every 4 hours if you must but call for appointment if use goes up over your usual need - Don't leave home without it !!  (think of it like the spare tire for your car)   Plan C = Crisis (instead of Plan B but only if Plan B stops working) - only use your albuterol nebulizer if you first try Plan B and it fails to help > ok to use the nebulizer up to every 4 hours but if start needing it regularly call for immediate appointment  Also  Ok to try albuterol 15 min before an activity (on alternating days)  that you know would usually make you short of breath and see if it makes any difference and if makes none then don't take albuterol after activity unless you can't catch your breath as this means it's the resting that helps, not the albuterol.     Make sure you check your oxygen saturation  AT  your highest level of activity (not after you stop)   to be sure it stays over 90% and adjust  02 flow upward to maintain this level if needed but remember to turn it back to previous settings when you stop (to conserve your supply).   We will refer you to [facility] for best fit for portable 02    Please schedule a follow up office visit in 6 weeks, call sooner if needed

## 2023-03-22 ENCOUNTER — Other Ambulatory Visit: Payer: Self-pay | Admitting: *Deleted

## 2023-03-22 ENCOUNTER — Telehealth: Payer: Self-pay | Admitting: Nurse Practitioner

## 2023-03-22 DIAGNOSIS — Z794 Long term (current) use of insulin: Secondary | ICD-10-CM

## 2023-03-22 DIAGNOSIS — N1832 Chronic kidney disease, stage 3b: Secondary | ICD-10-CM

## 2023-03-22 DIAGNOSIS — Z7985 Long-term (current) use of injectable non-insulin antidiabetic drugs: Secondary | ICD-10-CM

## 2023-03-22 DIAGNOSIS — Z7984 Long term (current) use of oral hypoglycemic drugs: Secondary | ICD-10-CM

## 2023-03-22 DIAGNOSIS — E039 Hypothyroidism, unspecified: Secondary | ICD-10-CM

## 2023-03-22 MED ORDER — EMPAGLIFLOZIN 10 MG PO TABS: 10.0000 mg | ORAL_TABLET | Freq: Every day | ORAL | 3 refills | Status: DC

## 2023-03-22 NOTE — Telephone Encounter (Signed)
Refill for the patient's [place] Pepper has been sent to the patient's pharmacy, [facility] on [address] in [place]. Her daughter was made aware.

## 2023-03-22 NOTE — Telephone Encounter (Signed)
Lab ordered are updated.

## 2023-03-22 NOTE — Telephone Encounter (Signed)
Pt was scheduled for the morning of 02/22/23.  We had snow and came in at lunch.  We moved her appt to that afternoon and then she called and canceled due to no ride.  She called to reschedule and I gave her first available at 05/31/23.  She said they denied her first refill request but wanted to know if this one would be approved.  I also stated we would call if we had an earlier cancellation.

## 2023-03-22 NOTE — Telephone Encounter (Signed)
 Pt needs labs updated

## 2023-03-23 NOTE — Progress Notes (Signed)
 [facility] [address], [place] [address]    Clinic Day:  03/24/2023  Referring physician: Vick Lurie, FNP  Patient Care Team: Vick Lurie, FNP as PCP - General (Family Medicine) [name] [name], MD as PCP - Cardiology (Cardiology) Shaaron Lamar HERO, MD as Consulting Physician (Gastroenterology) Luke Agent, MD (Internal Medicine) Therisa Benton PARAS, NP as Nurse Practitioner (Nurse Practitioner) Darlean Ozell NOVAK, MD as Consulting Physician (Pulmonary Disease)   ASSESSMENT & PLAN:   Assessment: 1.  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 - History of nephrotic range proteinuria, resolved now. - Skeletal survey (03/10/2022): Possible mottled lucency of the anterior aspect of the L1 vertebral body and mildly increased lucency of the inferior endplate of L2, requires additional dedicated cross-sectional imaging for better characterization CT lumbar spine (04/07/2022): Lytic lesion along superior endplate of L2 vertebral body and may be degenerative in etiology.  No definite suspicious lytic or blastic osseous lesions. MRI lumbar spine (05/08/2022) was negative for evidence of multiple myeloma, but showed multilevel Schmorl's nodes felt to account for lucency within the L2 superior endplate seen on CT, as well as mildly progressive multilevel spondylosis. - Most recent myeloma panel (07/09/2022) continues to show labs trending upwards: M-spike trending upwards at 2.0 (significantly elevated from M spike around 1.3 one year ago) Immunofixation PENDING Free light chains (last checked 03/10/2022) remain elevated but relatively stable with elevated kappa 790.4, normal lambda 19.0, and stable but elevated ratio 41.60. No CRAB features: Calcium  8.6.  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 Normal Hgb  12.5. LDH normal - Bone marrow biopsy (03/31/2022): Hypercellular bone marrow for age with plasmacytosis (plasma cells 13% of all cells).  Minor B-cell population with kappa light chain excess.  Per pathology report, unclear whether findings represent lymphoid component of a neoplastic lymphoplasmacytic disorder or an independent disease process such as monoclonal B-cell lymphocytosis. Molecular pathology was negative for any abnormal mutations Cytogenetics showed normal female karyotype (46,XX[20]) MYD88 mutation detected [c.794T>C (p.L265P)] - this mutation favors diagnosis of lymphoplasmacytic lymphoma, and may indicate higher risk of disease progression and greater disease burden     2.  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 - She is on Eliquis .  3.  Leukocytosis: - Intermittent leukocytosis since at least 2012 - Denies any chronic steroid use. - She is a non-smoker - BCR-ABL FISH is negative.  JAK2 with reflex is negative   4.  Microcytic anemia, RESOLVED - Previously known to be iron  deficient. - Last received IV iron  on 06/25/2017 and 07/16/2017.    5. CKD stage IIIa - Renal biopsy (08/29/2021) consistent with kidney disease from diabetes and hypertension - Baseline creatinine 1.1-1.4    Plan: 1.  IgG kappa smoldering myeloma/lymphoplasmacytic lymphoma: - CT CAP (09/21/2022): No evidence of lymphadenopathy or metastatic disease. - BMBX (03/31/2022): Hypercellular marrow with plasma cells 13% displaying kappa light chain restriction.  Significant lymphoid aggregate/lymphocyte is not seen.  Flow cytometry shows minor B-cell population representing 3% of cells with kappa light chain excess.  Lymphoid findings are atypical and similar to those from 2017.  It is not clear if it is competent of lymphoplasmacytic lymphoma or smoldering myeloma with monoclonal B-cell lymphocytosis.  Multiple myeloma FISH panel was normal.  Cytogenetics normal. -  MYD 88 was positive favoring lymphoplasmacytic lymphoma. - She  had COVID in November and still has shortness of breath, chest tightness and expectoration and follows up with Dr.Wert. - Denies any fevers, night sweats or significant weight loss. - Reviewed labs from 03/04/2023: Creatinine 1.40 and at baseline.  Low albumin  is stable at 2.5.  LDH is minimally elevated at 195.  M spike is 1.7 and stable. - No indications for treatment at this time.  Will do close monitoring in 4 months with repeat labs.   2.  Bilateral DVT: - Continue Eliquis .  No bleeding issues.  3.  Normocytic anemia: - Denies any BRBPR/melena. - Will check ferritin and iron  panel today.  Will also check B12 and folic acid .  If they are low, we discussed parenteral iron  therapy with INFeD  1 g IV x 1.  We discussed side effects including rare chance of anaphylactic reaction.  We will premedicate her with steroids.  Addendum: - Ferritin is 42 and percent saturation 12.  B12 and folic acid  is normal.  Will proceed with iron  infusion.    Orders Placed This Encounter  Procedures   Kappa/lambda light chains    Standing Status:   Future    Number of Occurrences:   1    Expected Date:   03/24/2023    Expiration Date:   03/23/2024   Iron  and TIBC ([facility] ONLY)    Standing Status:   Future    Number of Occurrences:   1    Expected Date:   03/24/2023    Expiration Date:   03/23/2024   Ferritin    Standing Status:   Future    Number of Occurrences:   1    Expected Date:   03/24/2023    Expiration Date:   03/23/2024   Vitamin B12    Standing Status:   Future    Number of Occurrences:   1    Expected Date:   03/24/2023    Expiration Date:   03/23/2024   Folate    Standing Status:   Future    Number of Occurrences:   1    Expected Date:   03/24/2023    Expiration Date:   03/23/2024   CBC with Differential    Standing Status:   Future    Expected Date:   07/15/2023    Expiration Date:   03/23/2024   Comprehensive  metabolic panel    Standing Status:   Future    Expected Date:   07/15/2023    Expiration Date:   03/23/2024   Lactate dehydrogenase    Standing Status:   Future    Expected Date:   07/15/2023    Expiration Date:   03/23/2024   Kappa/lambda light chains    Standing Status:   Future    Expected Date:   07/15/2023    Expiration Date:   03/23/2024   Protein electrophoresis, serum    Standing Status:   Future    Expected Date:   07/15/2023    Expiration Date:   03/23/2024   Iron  and TIBC ([facility] ONLY)    Standing Status:   Future    Expected Date:   07/15/2023    Expiration Date:   03/23/2024   Ferritin    Standing Status:   Future    Expected Date:   07/15/2023    Expiration Date:   03/23/2024      I,Stephanie [name],acting as a scribe for Stephanie Stands, MD.,have documented all relevant documentation on the behalf of Stephanie Stands,  MD,as directed by  Stephanie Stands, MD while in the presence of Stephanie Stands, MD.   I, Stephanie Stands MD, have reviewed the above documentation for accuracy and completeness, and I agree with the above.   Stephanie Stands, MD   2/5/202511:03 AM  CHIEF COMPLAINT:   Diagnosis: IgG kappa smoldering myeloma/lymphoplasmacytic lymphoma and history of DVT    Cancer Staging  No matching staging information was found for the patient.    Prior Therapy: none  Current Therapy:  surveillance; Eliquis    HISTORY OF PRESENT ILLNESS:   Oncology History   No history exists.     INTERVAL HISTORY:   Stephanie [name] is a 73 y.o. female presenting to clinic today for follow up of IgG kappa smoldering myeloma/lymphoplasmacytic lymphoma and history of DVT. She was last seen by me on 11/02/22.  Today, she states that she is doing well overall. Her appetite level is at 50%. Her energy level is at 10%.  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   Type 2 diabetes mellitus (HCC)     Surgical History: Past Surgical History:  Procedure Laterality Date   ABDOMINAL HYSTERECTOMY  partial   CARPAL TUNNEL RELEASE Right 1991   CATARACT EXTRACTION W/PHACO Right 05/08/2013   Procedure: CATARACT EXTRACTION PHACO AND INTRAOCULAR LENS PLACEMENT (IOC);  Surgeon: Cherene Mania, MD;  Location: [facility];  Service: Ophthalmology;  Laterality: Right;  CDE 10.31   CATARACT EXTRACTION W/PHACO Left 08/17/2013   Procedure: CATARACT EXTRACTION PHACO AND INTRAOCULAR LENS PLACEMENT (IOC);  Surgeon: Cherene Mania, MD;  Location: [facility];  Service: Ophthalmology;  Laterality: Left;  CDE:9.03   CHOLECYSTECTOMY  1971   COLONOSCOPY WITH PROPOFOL  N/A 01/06/2016   Dr. Shaaron: diverticulosis    DENTAL SURGERY     ESOPHAGEAL BRUSHING  08/29/2019   Procedure: ESOPHAGEAL BRUSHING;  Surgeon: Shaaron Lamar HERO, MD;  Location: [facility];  Service: Endoscopy;;   ESOPHAGOGASTRODUODENOSCOPY (EGD) WITH PROPOFOL  N/A 01/06/2016   Dr. Shaaron: normal s/p empiric dilation    ESOPHAGOGASTRODUODENOSCOPY (EGD) WITH PROPOFOL  N/A 08/29/2019   esophageal plaques vs medication residue adherent to tubular esophagus s/p KOH brushing and dilation. Medium-sized hiatal hernia. + for candida. Diflucan .    EYE SURGERY     IR BONE MARROW BIOPSY & ASPIRATION  03/31/2022   MALONEY DILATION N/A 01/06/2016   Procedure: AGAPITO DILATION;  Surgeon: Lamar HERO Shaaron, MD;  Location: [facility];  Service: Endoscopy;  Laterality: N/A;   MALONEY DILATION N/A 08/29/2019   Procedure:  AGAPITO DILATION;  Surgeon: Shaaron Lamar HERO, MD;  Location: [facility];  Service: Endoscopy;  Laterality: N/A;   REVERSE SHOULDER ARTHROPLASTY Right 08/06/2017   Procedure: RIGHT REVERSE SHOULDER ARTHROPLASTY;  Surgeon: Kay Kemps, MD;  Location: [facility] OR;  Service: Orthopedics;  Laterality: Right;   WRIST GANGLION EXCISION Left     Social History: Social History   Socioeconomic History   Marital status:  Married    Spouse name: Not on file   Number of children: Not on file   Years of education: Not on file   Highest education level: Not on file  Occupational History   Not on file  Tobacco Use   Smoking status: Former    Current packs/day: 0.00    Average packs/day: 0.3 packs/day for 30.0 years (7.5 ttl pk-yrs)    Types: Cigarettes    Start date: 02/16/1981    Quit date: 02/17/2011    Years since quitting: 12.1   Smokeless tobacco: Never  Vaping Use   Vaping status: Never Used  Substance and Sexual Activity   Alcohol use: Not Currently    Comment: rare   Drug use: No   Sexual activity: Not Currently    Birth control/protection: Surgical  Other Topics Concern   Not on file  Social History Narrative   Pt lives with daughter    Retired    Social Drivers of Corporate Investment Banker Strain: Not on file  Food Insecurity: No Food Insecurity (01/08/2023)   Hunger Vital Sign    Worried About Running Out of Food in the Last Year: Never true    Ran Out of Food in the Last Year: Never true  Transportation Needs: No Transportation Needs (01/08/2023)   PRAPARE - Administrator, Civil Service (Medical): No    Lack of Transportation (Non-Medical): No  Physical Activity: Not on file  Stress: Not on file  Social Connections: Not on file  Intimate Partner Violence: Not At Risk (01/08/2023)   Humiliation, Afraid, Rape, and Kick questionnaire    Fear of Current or Ex-Partner: No    Emotionally Abused: No    Physically Abused: No    Sexually Abused: No    Family  History: Family History  Problem Relation Age of Onset   Hypertension Mother    Diabetes Mother    COPD Mother    Arthritis Mother    Diabetes Father    Arthritis Father    Dementia Father    CAD Father    Hypothyroidism Sister    Diabetes Brother    Stroke Paternal Aunt    Colon cancer Niece    Colon polyps Neg Hx    Sleep apnea Neg Hx     Current Medications:  Current Outpatient Medications:    Accu-Chek FastClix Lancets MISC, Apply topically., Disp: , Rfl:    ACCU-CHEK GUIDE test strip, 4 (four) times daily., Disp: , Rfl:    albuterol  (PROVENTIL ) (2.5 MG/3ML) 0.083% nebulizer solution, Take 2.5 mg by nebulization every 6 (six) hours as needed for wheezing or shortness of breath., Disp: , Rfl:    albuterol  (VENTOLIN  HFA) 108 (90 Base) MCG/ACT inhaler, Inhale 1-2 puffs into the lungs every 6 (six) hours as needed for wheezing or shortness of breath., Disp: 18 g, Rfl: 0   Alcohol Swabs (ALCOHOL PADS) 70 % PADS, SMARTSIG:Pledget(s) Topical 4 Times Daily, Disp: , Rfl:    apixaban  (ELIQUIS ) 5 MG TABS tablet, Take 1 tablet (5 mg total) by mouth 2 (two) times daily., Disp: , Rfl:    Ascorbic Acid  (VITAMIN C ) 1000 MG tablet, Take 500 mg by mouth daily., Disp: , Rfl:    atorvastatin  (LIPITOR ) 80 MG tablet, Take 1 tablet (80 mg total) by mouth daily., Disp: 30 tablet, Rfl: 0   bisoprolol  (ZEBETA ) 10 MG tablet, TAKE 1 TABLET(10 MG) BY MOUTH IN THE MORNING AND AT BEDTIME, Disp: 180 tablet, Rfl: 3  Blood Glucose Calibration (ACCU-CHEK GUIDE CONTROL) LIQD, See admin instructions., Disp: , Rfl:    Blood Glucose Monitoring Suppl (ACCU-CHEK GUIDE ME) w/Device KIT, 4 (four) times daily., Disp: , Rfl:    Cetirizine  HCl 10 MG CAPS, Take 10 mg by mouth daily., Disp: , Rfl:    Cholecalciferol  (VITAMIN D ) 2000 units tablet, Take 4,000 Units by mouth daily. , Disp: , Rfl:    cimetidine  (TAGAMET ) 200 MG tablet, Take 0.5 tablets (100 mg total) by mouth daily as needed. (Patient taking differently: Take  100 mg by mouth daily as needed (indigestion).), Disp: , Rfl:    CINNAMON PO, Take 1-2 capsules by mouth 3 (three) times daily., Disp: , Rfl:    clobetasol  ointment (TEMOVATE ) 0.05 %, Apply 1 application  topically as needed (imflammation)., Disp: , Rfl:    DULoxetine  (CYMBALTA ) 60 MG capsule, Take 60 mg by mouth 2 (two) times daily., Disp: , Rfl:    EASY COMFORT PEN NEEDLES 31G X 5 MM MISC, INJECT IN SULIN EVERY DAY AS DIRECTED, Disp: , Rfl:    empagliflozin  (JARDIANCE ) 10 MG TABS tablet, Take 1 tablet (10 mg total) by mouth daily., Disp: 30 tablet, Rfl: 3   Evolocumab  (REPATHA  SURECLICK) 140 MG/ML SOAJ, Inject 140 mg into the skin every 14 (fourteen) days., Disp: 2 mL, Rfl: 3   guaiFENesin  (ROBITUSSIN) 100 MG/5ML SOLN, Take 5 mLs (100 mg total) by mouth every 4 (four) hours as needed for cough or to loosen phlegm. (Patient taking differently: Take 5 mLs by mouth as needed for cough or to loosen phlegm.), Disp: 236 mL, Rfl: 0   HUMALOG  KWIKPEN 100 UNIT/ML KwikPen, ADMINISTER 5 TO 11 UNITS UNDER THE SKIN THREE TIMES DAILY PER SLIDING SCALE, Disp: 30 mL, Rfl: 3   hydrOXYzine  (ATARAX /VISTARIL ) 25 MG tablet, Take 25 mg by mouth every 8 (eight) hours as needed for itching. , Disp: , Rfl:    insulin  degludec (TRESIBA  FLEXTOUCH) 100 UNIT/ML FlexTouch Pen, Inject 20 Units into the skin at bedtime., Disp: 18 mL, Rfl: 3   Ipratropium-Albuterol  (COMBIVENT  RESPIMAT IN), Inhale 2 puffs into the lungs., Disp: , Rfl:    Lancet Devices (EASY MINI EJECT LANCING DEVICE) MISC, 4 (four) times daily., Disp: , Rfl:    levothyroxine  (SYNTHROID ) 100 MCG tablet, Take 1 tablet (100 mcg total) by mouth daily before breakfast., Disp: 90 tablet, Rfl: 1   lidocaine  (LIDODERM ) 5 %, Place 1 patch onto the skin daily. Remove & Discard patch within 12 hours or as directed by MD, Disp: 30 patch, Rfl: 0   lubiprostone  (AMITIZA ) 24 MCG capsule, Take 1 capsule (24 mcg total) by mouth 2 (two) times daily with a meal. (Patient taking  differently: Take 24 mcg by mouth 2 (two) times daily as needed for constipation.), Disp: 60 capsule, Rfl: 11   LYRICA  75 MG capsule, Take 75 mg by mouth 2 (two) times daily. , Disp: , Rfl:    methocarbamol  (ROBAXIN ) 500 MG tablet, Take 1 tablet (500 mg total) by mouth 3 (three) times daily as needed. (Patient taking differently: Take 500 mg by mouth 3 (three) times daily as needed for muscle spasms.), Disp: 60 tablet, Rfl: 1   montelukast  (SINGULAIR ) 10 MG tablet, Take 10 mg by mouth at bedtime., Disp: , Rfl:    pantoprazole  (PROTONIX ) 40 MG tablet, Take 1 tablet (40 mg total) by mouth daily before breakfast., Disp: 90 tablet, Rfl: 3   RESTASIS  0.05 % ophthalmic emulsion, Place 1 drop into both eyes 2 (two) times  daily., Disp: , Rfl:    telmisartan (MICARDIS) 20 MG tablet, Take 40 mg by mouth daily., Disp: , Rfl:    Tiotropium Bromide-Olodaterol (STIOLTO RESPIMAT ) 2.5-2.5 MCG/ACT AERS, Inhale 2 puffs into the lungs daily., Disp: 1 each, Rfl: 11   Tiotropium Bromide-Olodaterol (STIOLTO RESPIMAT ) 2.5-2.5 MCG/ACT AERS, Inhale 2 puffs into the lungs daily., Disp: 1 each, Rfl:    tirzepatide  (MOUNJARO ) 10 MG/0.5ML Pen, Inject 10 mg into the skin once a week., Disp: 6 mL, Rfl: 1   torsemide  (DEMADEX ) 20 MG tablet, Take 2 tablets (40 mg total) by mouth daily. (Patient taking differently: Take 20 mg by mouth daily.), Disp: 60 tablet, Rfl: 0   traMADol  (ULTRAM ) 50 MG tablet, Take 50 mg by mouth 3 (three) times daily as needed for moderate pain., Disp: , Rfl: 1   traZODone  (DESYREL ) 150 MG tablet, Take 150 mg by mouth at bedtime., Disp: , Rfl:    ursodiol  (ACTIGALL ) 300 MG capsule, Take 300 mg by mouth 2 (two) times daily., Disp: , Rfl:    vitamin B-12 (CYANOCOBALAMIN ) 1000 MCG tablet, Take 1,000 mcg by mouth daily., Disp: , Rfl:    Allergies: Allergies  Allergen Reactions   Tetracyclines & Related Anaphylaxis and Rash   Banana Hives and Nausea And Vomiting   Penicillins Rash and Other (See Comments)     REVIEW OF SYSTEMS:   Review of Systems  Constitutional:  Negative for chills, fatigue and fever.  HENT:   Negative for lump/mass, mouth sores, nosebleeds, sore throat and trouble swallowing.   Eyes:  Negative for eye problems.  Respiratory:  Positive for cough and shortness of breath.   Cardiovascular:  Positive for palpitations. Negative for chest pain and leg swelling.  Gastrointestinal:  Positive for constipation. Negative for abdominal pain, diarrhea, nausea and vomiting.  Genitourinary:  Negative for bladder incontinence, difficulty urinating, dysuria, frequency, hematuria and nocturia.   Musculoskeletal:  Positive for arthralgias. Negative for back pain, flank pain, myalgias and neck pain.  Skin:  Negative for itching and rash.  Neurological:  Positive for numbness. Negative for dizziness and headaches.  Hematological:  Does not bruise/bleed easily.  Psychiatric/Behavioral:  Positive for sleep disturbance. Negative for depression and suicidal ideas. The patient is not nervous/anxious.   All other systems reviewed and are negative.    VITALS:   Blood pressure (!) 162/84, pulse 96, temperature 98.2 F (36.8 C), temperature source Oral, resp. rate 16, weight 234 lb 2.1 oz (106.2 kg), SpO2 100%.  Wt Readings from Last 3 Encounters:  03/24/23 234 lb 2.1 oz (106.2 kg)  03/19/23 228 lb 12.8 oz (103.8 kg)  03/16/23 229 lb (103.9 kg)    Body mass index is 35.6 kg/m.  Performance status (ECOG): 1 - Symptomatic but completely ambulatory  PHYSICAL EXAM:   Physical Exam Vitals and nursing note reviewed. Exam conducted with a chaperone present.  Constitutional:      Appearance: Normal appearance.  Cardiovascular:     Rate and Rhythm: Normal rate and regular rhythm.     Pulses: Normal pulses.     Heart sounds: Normal heart sounds.  Pulmonary:     Effort: Pulmonary effort is normal.     Breath sounds: Normal breath sounds.  Abdominal:     Palpations: Abdomen is soft. There  is no hepatomegaly, splenomegaly or mass.     Tenderness: There is no abdominal tenderness.  Musculoskeletal:     Right lower leg: No edema.     Left lower leg: No edema.  Lymphadenopathy:     Cervical: No cervical adenopathy.     Right cervical: No superficial, deep or posterior cervical adenopathy.    Left cervical: No superficial, deep or posterior cervical adenopathy.     Upper Body:     Right upper body: No supraclavicular or axillary adenopathy.     Left upper body: No supraclavicular or axillary adenopathy.  Neurological:     General: No focal deficit present.     Mental Status: She is alert and oriented to person, place, and time.  Psychiatric:        Mood and Affect: Mood normal.        Behavior: Behavior normal.    LABS:   CBC     Component Value Date/Time   WBC 8.9 03/04/2023 1103   RBC 4.12 03/04/2023 1103   HGB 10.2 (L) 03/04/2023 1103   HCT 34.8 (L) 03/04/2023 1103   PLT 240 03/04/2023 1103   MCV 84.5 03/04/2023 1103   MCH 24.8 (L) 03/04/2023 1103   MCHC 29.3 (L) 03/04/2023 1103   RDW 14.8 03/04/2023 1103   LYMPHSABS 2.6 03/04/2023 1103   MONOABS 0.8 03/04/2023 1103   EOSABS 0.3 03/04/2023 1103   BASOSABS 0.1 03/04/2023 1103    CMP      Component Value Date/Time   NA 137 03/04/2023 1103   K 3.6 03/04/2023 1103   CL 104 03/04/2023 1103   CO2 24 03/04/2023 1103   GLUCOSE 118 (H) 03/04/2023 1103   BUN 19 03/04/2023 1103   CREATININE 1.40 (H) 03/04/2023 1103   CREATININE 0.95 02/11/2023 1445   CALCIUM  8.8 (L) 03/04/2023 1103   CALCIUM  8.9 08/04/2017 1032   PROT 8.0 03/04/2023 1103   PROT 8.1 12/16/2015 1625   ALBUMIN  2.5 (L) 03/04/2023 1103   ALBUMIN  3.7 12/16/2015 1625   AST 20 03/04/2023 1103   ALT 13 03/04/2023 1103   ALKPHOS 155 (H) 03/04/2023 1103   BILITOT 0.5 03/04/2023 1103   BILITOT 0.2 12/16/2015 1625   GFRNONAA 40 (L) 03/04/2023 1103   GFRAA 54 (L) 09/05/2019 1021     No results found for: CEA1, CEA / No results found for:  CEA1, CEA No results found for: PSA1 No results found for: CAN199 No results found for: RJW874  Lab Results  Component Value Date   TOTALPROTELP 7.5 03/04/2023   ALBUMINELP 2.6 (L) 03/04/2023   A1GS 0.3 03/04/2023   A2GS 1.0 03/04/2023   BETS 1.2 03/04/2023   GAMS 2.5 (H) 03/04/2023   MSPIKE 1.7 (H) 03/04/2023   SPEI Comment 03/04/2023   Lab Results  Component Value Date   TIBC 274 03/10/2022   TIBC 295 10/08/2021   TIBC 291 05/12/2021   FERRITIN 129 03/10/2022   FERRITIN 112 10/16/2021   FERRITIN 81 05/12/2021   IRONPCTSAT 35 (H) 03/10/2022   IRONPCTSAT 28 10/08/2021   IRONPCTSAT 19 05/12/2021   Lab Results  Component Value Date   LDH 195 (H) 03/04/2023   LDH 139 10/26/2022   LDH 184 07/09/2022     STUDIES:   No results found.

## 2023-03-24 ENCOUNTER — Inpatient Hospital Stay: Payer: 59 | Attending: Hematology | Admitting: Hematology

## 2023-03-24 ENCOUNTER — Inpatient Hospital Stay: Payer: 59

## 2023-03-24 VITALS — BP 162/84 | HR 96 | Temp 98.2°F | Resp 16 | Wt 234.1 lb

## 2023-03-24 DIAGNOSIS — D649 Anemia, unspecified: Secondary | ICD-10-CM | POA: Diagnosis not present

## 2023-03-24 DIAGNOSIS — N1831 Chronic kidney disease, stage 3a: Secondary | ICD-10-CM | POA: Insufficient documentation

## 2023-03-24 DIAGNOSIS — D472 Monoclonal gammopathy: Secondary | ICD-10-CM | POA: Insufficient documentation

## 2023-03-24 DIAGNOSIS — I129 Hypertensive chronic kidney disease with stage 1 through stage 4 chronic kidney disease, or unspecified chronic kidney disease: Secondary | ICD-10-CM | POA: Insufficient documentation

## 2023-03-24 DIAGNOSIS — C8300 Small cell B-cell lymphoma, unspecified site: Secondary | ICD-10-CM

## 2023-03-24 DIAGNOSIS — D509 Iron deficiency anemia, unspecified: Secondary | ICD-10-CM

## 2023-03-24 LAB — IRON AND TIBC
Iron: 34 ug/dL (ref 28–170)
Saturation Ratios: 12 % (ref 10.4–31.8)
TIBC: 294 ug/dL (ref 250–450)
UIBC: 260 ug/dL

## 2023-03-24 LAB — FOLATE: Folate: 9.9 ng/mL (ref >5.9)

## 2023-03-24 LAB — VITAMIN B12: Vitamin B-12: 957 pg/mL — ABNORMAL HIGH (ref 180–914)

## 2023-03-24 LAB — FERRITIN: Ferritin: 42 ng/mL (ref 11–307)

## 2023-03-24 NOTE — Patient Instructions (Addendum)
 [facility] at [facility] Discharge Instructions   You were seen and examined today by Dr. Rogers.  He reviewed the results of your lab work which are mostly normal/stable.  We will do additional lab work today to investigate this.   We will see you back in 4 months. We will repeat lab work prior to this visit.    Return as scheduled.    Thank you for choosing [facility] at [facility] to provide your oncology and hematology care.  To afford each patient quality time with our provider, please arrive at least 15 minutes before your scheduled appointment time.   If you have a lab appointment with the Cancer Center please come in thru the Main Entrance and check in at the main information desk.  You need to re-schedule your appointment should you arrive 10 or more minutes late.  We strive to give you quality time with our providers, and arriving late affects you and other patients whose appointments are after yours.  Also, if you no show three or more times for appointments you may be dismissed from the clinic at the providers discretion.     Again, thank you for choosing [facility].  Our hope is that these requests will decrease the amount of time that you wait before being seen by our physicians.       _____________________________________________________________  Should you have questions after your visit to [facility], please contact our office at [phone] and follow the prompts.  Our office hours are 8:00 a.m. and 4:30 p.m. Monday - Friday.  Please note that voicemails left after 4:00 p.m. may not be returned until the following business day.  We are closed weekends and major holidays.  You do have access to a nurse 24-7, just call the main number to the clinic [phone] and do not press any options, hold on the line and a nurse will answer the phone.    For prescription refill requests, have your pharmacy  contact our office and allow 72 hours.    Due to Covid, you will need to wear a mask upon entering the hospital. If you do not have a mask, a mask will be given to you at the Main Entrance upon arrival. For doctor visits, patients may have 1 support person age 33 or older with them. For treatment visits, patients can not have anyone with them due to social distancing guidelines and our immunocompromised population.

## 2023-03-25 LAB — KAPPA/LAMBDA LIGHT CHAINS
Kappa free light chain: 1194.7 mg/L — ABNORMAL HIGH (ref 3.3–19.4)
Kappa, lambda light chain ratio: 29.87 — ABNORMAL HIGH (ref 0.26–1.65)
Lambda free light chains: 40 mg/L — ABNORMAL HIGH (ref 5.7–26.3)

## 2023-03-25 NOTE — Progress Notes (Signed)
 Stephanie [name]

## 2023-03-26 ENCOUNTER — Encounter: Payer: Self-pay | Admitting: Orthopedic Surgery

## 2023-03-26 ENCOUNTER — Ambulatory Visit (INDEPENDENT_AMBULATORY_CARE_PROVIDER_SITE_OTHER): Payer: 59 | Admitting: Orthopedic Surgery

## 2023-03-26 DIAGNOSIS — G8929 Other chronic pain: Secondary | ICD-10-CM | POA: Diagnosis not present

## 2023-03-26 DIAGNOSIS — M25561 Pain in right knee: Secondary | ICD-10-CM | POA: Diagnosis not present

## 2023-03-26 DIAGNOSIS — M25562 Pain in left knee: Secondary | ICD-10-CM

## 2023-03-26 DIAGNOSIS — M170 Bilateral primary osteoarthritis of knee: Secondary | ICD-10-CM

## 2023-03-26 MED ORDER — METHYLPREDNISOLONE ACETATE 40 MG/ML IJ SUSP
40.0000 mg | Freq: Once | INTRAMUSCULAR | Status: AC
  Administered 2023-03-26: 40 mg via INTRA_ARTICULAR

## 2023-03-26 NOTE — Progress Notes (Signed)
   Chief Complaint  Patient presents with   Injections    Both knees     Encounter Diagnoses  Name Primary?   Chronic pain of right knee Yes   Chronic pain of left knee    Bilateral primary osteoarthritis of knee     Request for bilateral knee injections  Procedure note for bilateral knee injections  Procedure note left knee injection verbal consent was obtained to inject left knee joint  Timeout was completed to confirm the site of injection  The medications used were 40 mg depomedrol and 3 cc of 1% lidocaine   Anesthesia was provided by ethyl chloride and the skin was prepped with alcohol.  After cleaning the skin with alcohol a 20-gauge needle was used to inject the left knee joint. There were no complications. A sterile bandage was applied.   Procedure note right knee injection verbal consent was obtained to inject right knee joint  Timeout was completed to confirm the site of injection  The medications used were 40 mg depomedrol and 3 cc of 1% lidocaine   Anesthesia was provided by ethyl chloride and the skin was prepped with alcohol.  After cleaning the skin with alcohol a 20-gauge needle was used to inject the right knee joint. There were no complications. A sterile bandage was applied.

## 2023-04-14 ENCOUNTER — Inpatient Hospital Stay: Payer: 59

## 2023-04-15 ENCOUNTER — Inpatient Hospital Stay: Payer: 59

## 2023-04-15 DIAGNOSIS — D509 Iron deficiency anemia, unspecified: Secondary | ICD-10-CM

## 2023-04-15 DIAGNOSIS — C8300 Small cell B-cell lymphoma, unspecified site: Secondary | ICD-10-CM

## 2023-04-15 DIAGNOSIS — D472 Monoclonal gammopathy: Secondary | ICD-10-CM | POA: Diagnosis not present

## 2023-04-15 LAB — COMPREHENSIVE METABOLIC PANEL
ALT: 18 U/L (ref 0–44)
AST: 18 U/L (ref 15–41)
Albumin: 2.4 g/dL — ABNORMAL LOW (ref 3.5–5.0)
Alkaline Phosphatase: 139 U/L — ABNORMAL HIGH (ref 38–126)
Anion gap: 9 (ref 5–15)
BUN: 18 mg/dL (ref 8–23)
CO2: 24 mmol/L (ref 22–32)
Calcium: 9 mg/dL (ref 8.9–10.3)
Chloride: 103 mmol/L (ref 98–111)
Creatinine, Ser: 1.21 mg/dL — ABNORMAL HIGH (ref 0.44–1.00)
GFR, Estimated: 48 mL/min — ABNORMAL LOW (ref >60)
Glucose, Bld: 185 mg/dL — ABNORMAL HIGH (ref 70–99)
Potassium: 4 mmol/L (ref 3.5–5.1)
Sodium: 136 mmol/L (ref 135–145)
Total Bilirubin: 1 mg/dL (ref 0.0–1.2)
Total Protein: 7.3 g/dL (ref 6.5–8.1)

## 2023-04-15 LAB — CBC WITH DIFFERENTIAL/PLATELET
Abs Immature Granulocytes: 0.05 10*3/uL (ref 0.00–0.07)
Basophils Absolute: 0 10*3/uL (ref 0.0–0.1)
Basophils Relative: 0 %
Eosinophils Absolute: 0.2 10*3/uL (ref 0.0–0.5)
Eosinophils Relative: 2 %
HCT: 35.5 % — ABNORMAL LOW (ref 36.0–46.0)
Hemoglobin: 10.6 g/dL — ABNORMAL LOW (ref 12.0–15.0)
Immature Granulocytes: 1 %
Lymphocytes Relative: 17 %
Lymphs Abs: 1.8 10*3/uL (ref 0.7–4.0)
MCH: 24.3 pg — ABNORMAL LOW (ref 26.0–34.0)
MCHC: 29.9 g/dL — ABNORMAL LOW (ref 30.0–36.0)
MCV: 81.4 fL (ref 80.0–100.0)
Monocytes Absolute: 0.8 10*3/uL (ref 0.1–1.0)
Monocytes Relative: 8 %
Neutro Abs: 7.6 10*3/uL (ref 1.7–7.7)
Neutrophils Relative %: 72 %
Platelets: 246 10*3/uL (ref 150–400)
RBC: 4.36 MIL/uL (ref 3.87–5.11)
RDW: 15.9 % — ABNORMAL HIGH (ref 11.5–15.5)
WBC: 10.5 10*3/uL (ref 4.0–10.5)
nRBC: 0 % (ref 0.0–0.2)

## 2023-04-15 LAB — IRON AND TIBC
Iron: 47 ug/dL (ref 28–170)
Saturation Ratios: 13 % (ref 10.4–31.8)
TIBC: 361 ug/dL (ref 250–450)
UIBC: 314 ug/dL

## 2023-04-15 LAB — LACTATE DEHYDROGENASE: LDH: 207 U/L — ABNORMAL HIGH (ref 98–192)

## 2023-04-15 LAB — FERRITIN: Ferritin: 28 ng/mL (ref 11–307)

## 2023-04-16 LAB — KAPPA/LAMBDA LIGHT CHAINS
Kappa free light chain: 886.7 mg/L — ABNORMAL HIGH (ref 3.3–19.4)
Kappa, lambda light chain ratio: 26.55 — ABNORMAL HIGH (ref 0.26–1.65)
Lambda free light chains: 33.4 mg/L — ABNORMAL HIGH (ref 5.7–26.3)

## 2023-04-19 LAB — PROTEIN ELECTROPHORESIS, SERUM
A/G Ratio: 0.5 — ABNORMAL LOW (ref 0.7–1.7)
Albumin ELP: 2.6 g/dL — ABNORMAL LOW (ref 2.9–4.4)
Alpha-1-Globulin: 0.3 g/dL (ref 0.0–0.4)
Alpha-2-Globulin: 1 g/dL (ref 0.4–1.0)
Beta Globulin: 1.2 g/dL (ref 0.7–1.3)
Gamma Globulin: 2.6 g/dL — ABNORMAL HIGH (ref 0.4–1.8)
Globulin, Total: 5 g/dL — ABNORMAL HIGH (ref 2.2–3.9)
M-Spike, %: 1.5 g/dL — ABNORMAL HIGH
Total Protein ELP: 7.6 g/dL (ref 6.0–8.5)

## 2023-04-20 NOTE — Progress Notes (Addendum)
 [facility] [address], [place] [address]    Clinic Day:  04/20/2023  Referring physician: Marylynn Pearson, FNP  Patient Care Team: Marylynn Pearson, FNP as PCP - General (Family Medicine) Jonelle Sidle, MD as PCP - Cardiology (Cardiology) Jena Gauss Gerrit Friends, MD as Consulting Physician (Gastroenterology) Pearson Grippe, MD (Internal Medicine) Dani Gobble, NP as Nurse Practitioner (Nurse Practitioner) Nyoka Cowden, MD as Consulting Physician (Pulmonary Disease)   I connected with Ethelyne Erich Irmen on 04/21/23 at 11:15 AM EST by telephone visit and verified that I am speaking with the correct person using two identifiers.   I discussed the limitations, risks, security and privacy concerns of performing an evaluation and management service by telemedicine and the availability of in-person appointments. I also discussed with the patient that there may be a patient responsible charge related to this service. The patient expressed understanding and agreed to proceed.   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 - History of nephrotic range proteinuria, resolved now. - Skeletal survey (03/10/2022): Possible mottled lucency of the anterior aspect of the L1 vertebral body and mildly increased lucency of the inferior endplate of L2, requires additional dedicated cross-sectional imaging for better characterization CT lumbar spine (04/07/2022): Lytic lesion along superior endplate of L2 vertebral body and may be degenerative in etiology.  No definite suspicious lytic or blastic osseous  lesions. MRI lumbar spine (05/08/2022) was negative for evidence of multiple myeloma, but showed multilevel Schmorl's nodes felt to account for lucency within the L2 superior endplate seen on CT, as well as mildly progressive multilevel spondylosis. - Most recent myeloma panel (07/09/2022) continues to show labs trending upwards: M-spike trending upwards at 2.0 (significantly elevated from M spike around 1.3 one year ago) Immunofixation PENDING Free light chains (last checked 03/10/2022) remain elevated but relatively stable with elevated kappa 790.4, normal lambda 19.0, and stable but elevated ratio 41.60. No CRAB features: Calcium 8.6.  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 Normal Hgb 12.5. LDH normal - Bone marrow biopsy (03/31/2022): Hypercellular bone marrow for age with plasmacytosis (plasma cells 13% of all cells).  Minor B-cell population with kappa light chain excess.  Per pathology report, unclear whether findings represent lymphoid component of a neoplastic lymphoplasmacytic disorder or an independent disease process such as monoclonal B-cell lymphocytosis. Molecular pathology was negative for any abnormal mutations Cytogenetics showed normal female karyotype (46,XX[20]) MYD88 mutation detected [c.794T>C (p.L265P)] - this mutation favors diagnosis of lymphoplasmacytic lymphoma, and may indicate higher risk of disease progression and greater disease burden     2.  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 - She is on Eliquis.  3.  Leukocytosis: - Intermittent leukocytosis since at least 2012 - Denies any chronic steroid use. - She is a non-smoker - BCR-ABL FISH is negative.  JAK2 with reflex is negative   4.  Microcytic anemia, RESOLVED - Previously known to be iron deficient. - Last received IV iron on 06/25/2017 and 07/16/2017.   5. CKD stage IIIa - Renal biopsy (08/29/2021) consistent with kidney disease  from diabetes and hypertension - Baseline creatinine 1.1-1.4    Plan: 1.  IgG kappa smoldering myeloma/lymphoplasmacytic lymphoma: - CT CAP (09/21/2022): No adenopathy. - BMBX (03/31/2022): Hypercellular marrow with plasma cells 13% displaying kappa light chain restriction.  Significant lymphoid aggregate/lymphocyte not seen.  Flow cytometry shows minor B-cell population representing 3% with kappa light chain excess.  Lymphoid findings are atypical and similar to those from 2017.  It is not clear if it is a competent of lymphoplasmacytic lymphoma or smoldering myeloma with monoclonal B-cell lymphocytosis.  Multiple myeloma FISH panel was normal.  Cytogenetics was normal. -MYD88 was positive favoring lymphoplasmacytic lymphoma. - Denies fevers, night sweats or weight loss. - Labs from 04/15/2023: Creatinine 1.21.  Calcium 9.0.  M spike is 1.5.  FLC ratio is 26.55, down from 29.8. - RTC 4 months for follow-up with repeat labs. .   2.  Bilateral DVT: - Continue Eliquis twice daily.  No bleeding issues.   3.  Normocytic anemia: - Denies bleeding per rectum or melena. - Ferritin is further down to 28 from 42 at last visit.  She complains of severe fatigue. - We talked about starting her on iron infusion with INFeD 1 g IV x 1.  Will give premedications.  We will repeat iron panel in 4 months.    Total time spent is 20 minutes.  No orders of the defined types were placed in this encounter.     I,Katie Daubenspeck,acting as a Neurosurgeon for Stephanie Massed, MD.,have documented all relevant documentation on the behalf of Stephanie Massed, MD,as directed by  Stephanie Massed, MD while in the presence of Stephanie Massed, MD.   I, Stephanie Massed MD, have reviewed the above documentation for accuracy and completeness, and I agree with the above.   Katie Daubenspeck   3/4/20258:58 PM  CHIEF COMPLAINT:   Diagnosis: IgG kappa smoldering myeloma/lymphoplasmacytic lymphoma and  history of DVT   Cancer Staging  No matching staging information was found for the patient.    Prior Therapy: none  Current Therapy:  surveillance; Eliquis     LABS:   CBC     Component Value Date/Time   WBC 10.5 04/15/2023 1443   RBC 4.36 04/15/2023 1443   HGB 10.6 (L) 04/15/2023 1443   HCT 35.5 (L) 04/15/2023 1443   PLT 246 04/15/2023 1443   MCV 81.4 04/15/2023 1443   MCH 24.3 (L) 04/15/2023 1443   MCHC 29.9 (L) 04/15/2023 1443   RDW 15.9 (H) 04/15/2023 1443   LYMPHSABS 1.8 04/15/2023 1443   MONOABS 0.8 04/15/2023 1443   EOSABS 0.2 04/15/2023 1443   BASOSABS 0.0 04/15/2023 1443    CMP      Component Value Date/Time   NA 136 04/15/2023 1443   K 4.0 04/15/2023 1443   CL 103 04/15/2023 1443   CO2 24 04/15/2023 1443   GLUCOSE 185 (H) 04/15/2023 1443   BUN 18 04/15/2023 1443   CREATININE 1.21 (H) 04/15/2023 1443   CREATININE 0.95 02/11/2023 1445   CALCIUM 9.0 04/15/2023 1443   CALCIUM 8.9 08/04/2017 1032   PROT 7.3 04/15/2023 1443   PROT 8.1 12/16/2015 1625   ALBUMIN 2.4 (L) 04/15/2023 1443   ALBUMIN 3.7 12/16/2015 1625   AST 18 04/15/2023 1443   ALT 18 04/15/2023 1443   ALKPHOS 139 (H) 04/15/2023 1443   BILITOT 1.0 04/15/2023 1443   BILITOT 0.2 12/16/2015 1625   GFRNONAA 48 (L) 04/15/2023 1443   GFRAA 54 (L) 09/05/2019 1021     No results  found for: "CEA1", "CEA" / No results found for: "CEA1", "CEA" No results found for: "PSA1" No results found for: "CAN199" No results found for: "CAN125"  Lab Results  Component Value Date   TOTALPROTELP 7.6 04/15/2023   ALBUMINELP 2.6 (L) 04/15/2023   A1GS 0.3 04/15/2023   A2GS 1.0 04/15/2023   BETS 1.2 04/15/2023   GAMS 2.6 (H) 04/15/2023   MSPIKE 1.5 (H) 04/15/2023   SPEI Comment 04/15/2023   Lab Results  Component Value Date   TIBC 361 04/15/2023   TIBC 294 03/24/2023   TIBC 274 03/10/2022   FERRITIN 28 04/15/2023   FERRITIN 42 03/24/2023   FERRITIN 129 03/10/2022   IRONPCTSAT 13 04/15/2023    IRONPCTSAT 12 03/24/2023   IRONPCTSAT 35 (H) 03/10/2022   Lab Results  Component Value Date   LDH 207 (H) 04/15/2023   LDH 195 (H) 03/04/2023   LDH 139 10/26/2022     STUDIES:   No results found.

## 2023-04-21 ENCOUNTER — Inpatient Hospital Stay: Payer: 59 | Attending: Hematology | Admitting: Hematology

## 2023-04-21 DIAGNOSIS — I82403 Acute embolism and thrombosis of unspecified deep veins of lower extremity, bilateral: Secondary | ICD-10-CM

## 2023-04-21 DIAGNOSIS — D472 Monoclonal gammopathy: Secondary | ICD-10-CM

## 2023-04-21 DIAGNOSIS — D509 Iron deficiency anemia, unspecified: Secondary | ICD-10-CM | POA: Diagnosis not present

## 2023-04-21 DIAGNOSIS — N1831 Chronic kidney disease, stage 3a: Secondary | ICD-10-CM | POA: Insufficient documentation

## 2023-04-28 NOTE — Progress Notes (Signed)
 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   Admit date: 03/17/2020 Discharge date: 03/22/2020       Recommendations for Outpatient Follow-up:  Repeat basic metabolic panel to evaluate lites and renal function Reassess blood pressure and adjust antihypertensive treatment as needed Continue to closely follow CBGs and adjust hypoglycemic regimen as required Please reassess the need for oxygen supplementation. Repeat CXR in 6 weeks to assure complete resolution of infiltrates.     Discharge Diagnoses:  Principal Problem:   Acute respiratory disease due to COVID-19 virus   Hypertension   COPD (chronic obstructive pulmonary disease) (HCC)   MGUS (monoclonal gammopathy of unknown significance)   Pneumonia due to COVID-19 virus   COVID-19   Chronic diastolic CHF (congestive heart failure) (HCC)     History of present illness:  Stephanie [name] is a 73 y.o. female with medical history significant of Covid pneumonia just discharged today to home from the hospital after completing remdesivir on steroids.  Patient normally is on 2 L of oxygen and was discharged on 6 L with exertion but at rest was only requiring 4 L.  She was transferred by ambulance home by the time she got home she was extremely short of breath and hypoxic.  Patient was readmitted for acute hypoxemic respiratory failure in the setting of Covid pneumonia.  Pulmonology following.   Hospital Course:  Acute hypoxemic respiratory failure secondary to COVID-19 pneumonia -Continue steroids and wean off O2 supplementation as tolerated.    -currently using 2L at rest and 3.5-4L on exertion -continue slow steroids tapering. -Procalcitonin low, not needing antibiotics currently -Continue  breathing treatments as needed -Appreciate pulmonology consultation and rec's   Acute COPD exacerbation related to above -IV steroids and breathing treatments as noted above -no wheezing on exam.   Hypokalemia -Repleted -safe to resume diuretics and daily supplementation. -follow electrolytes trend   Type 2 diabetes with hyperglycemia-improved -Resume home adjusted hypoglycemic regimen -advise to follow low carb diet. -Recent A1c of 9.2% -expecting improvement in her CBG's while tapering off steroids.    CKD stage IIIa with AKI -Improved and back to baseline at discharge -Safe to resume the use of metolazone and Demadex -Continue to maintain adequate hydration -Repeat basic metabolic panel at follow-up visit to assess electrolytes and renal function and stability.   Hypertension -stable -follow heart healthy diet. -Resume home antihypertensive agents.   Hypothyroidism -Continue Synthroid   Class 1 Obesity -low calorie diet, portion control and increase physical activity recommended.  -Body mass index is 33.89 kg/m.   Chronic diastolic heart failure -Stable and compensated -Continue to follow daily weights, low-sodium diet and resume home diuretic regimen.       History of Present Illness  06/18/2020  Pulmonary/ 1st [name] eval/ Stephanie [name] / [facility] [name]  Chief Complaint  Patient presents with   Pulmonary Consult    Referred by Dr Sherryll Burger.  Pt had covid 06 Mar 2020- admitted to [facility] and sent home with supplemental o2. She gets winded with exertion such as cooking and cleaning her home. She uses her albuterol inhaler 2 x daily on average.   Dyspnea:  Room to room at home / scooter for shopping due to back > breathing on  2lpm  Cough: worse since covid / prednisone rx per PCP x 4 more days Sleep: flat bed with 3 pillows worse cough  SABA use: twice daily as above   rec Stop metaprolol  Bisoprolol 5 mg twice daily - call med to fine tune it.  Change pantoprazole 40 mg  Take 30- 60 min before your first and last meals of the day  GERD Only use your albuterol as a rescue medication  Make sure you check your oxygen saturation  at your highest level of activity  to be sure it stays over 90%   Please schedule a follow up [name] visit in 2 weeks, call sooner if needed with all medications /inhalers/ solutions in hand      PFT's  09/17/20  FEV1 2.25 (102 % ) ratio 0.85 p 0 % improvement from saba (off anoro) prior to study with DLCO  11.63 (52%) corrects to 3.20 (78%)  for alv volume and FV curve flattened top of ext curve  erv not done,  needs cxr to correlate lung vol low       Admit Jan 08 2023 [facility] p covid d/c on 02 3- 3.5 lpm    03/19/2023    re-establish post covid  ov/Stephanie [name]/Stephanie [name] re: resp failure post covid  maint on multiple saba's  Chief Complaint  Patient presents with   Follow-up  Dyspnea:  limited by arthritis  f/b [place]  Cough: has spell twice daily > varies color but always thick  Sleeping: on side flat bed two pillows  s  resp cc  SABA use: combivent bid /albterol inhaler/ and then duoneb  02: 02 2 lpm hs and sometimes needs 3lpm during the day with sats 84% at rest.  Rec Plan A = Automatic = Always=    Stiolto 2 pffs  each am  Plan B = Backup (to supplement plan A, not to replace it) Only use your albuterol inhaler as a rescue medication Plan C = Crisis (instead of Plan B but only if Plan B stops working) - only use your albuterol nebulizer if you first try Plan B Also  Ok to try albuterol 15 min before an activity (on alternating days)  that you know would usually make you short of breath Make sure you check your oxygen saturation  AT  your highest level of activity (not after you stop)   to be sure it stays over 90%   We will refer you to [facility] for best fit for portable 02    04/29/2023  f/u ov/Stephanie [name]/Stephanie [name] re: chronic resp failure  maint on 2 lpm   Chief Complaint  Patient presents with   Follow-up    [place]  6  week follow up    Dyspnea:  one room to next  25 ft [place] Cough: worse in am's > min mucoid  Sleeping: flat bed  3 pillows resp cc  sev times a week gets up in chair do sleep due to sob  SABA use: too much / no change on or off stiolto  02: 2lpm but not checking    No obvious day to day or daytime variability or assoc  mucus plugs or hemoptysis or cp or chest tightness, subjective wheeze or overt sinus or hb symptoms.    Also denies any obvious fluctuation of symptoms with weather or environmental changes or other aggravating or alleviating factors except as outlined above   No unusual exposure hx or h/o childhood pna/ asthma or knowledge of premature  birth.  Current Allergies, Complete Past Medical History, Past Surgical History, Family History, and Social History were reviewed in [employer] record.  ROS  The following are not active complaints unless bolded Hoarseness, sore throat, dysphagia, dental problems, itching, sneezing,  nasal congestion or discharge of excess mucus or purulent secretions, ear ache,   fever, chills, sweats, unintended wt loss or wt gain, classically pleuritic or exertional cp,  orthopnea pnd or arm/hand swelling  or leg swelling, presyncope, palpitations, abdominal pain, anorexia, nausea, vomiting, diarrhea  or change in bowel habits or change in bladder habits, change in stools or change in urine, dysuria, hematuria,  rash, arthralgias, visual complaints, headache, numbness, weakness or ataxia or problems with walking or coordination,  change in mood or  memory.        Current Meds  Medication Sig   Accu-Chek FastClix Lancets MISC Apply topically.   ACCU-CHEK GUIDE test strip 4 (four) times daily.   albuterol (PROVENTIL) (2.5 MG/3ML) 0.083% nebulizer solution Take 2.5 mg by nebulization every 6 (six) hours as needed for wheezing or shortness of breath.   albuterol (VENTOLIN HFA) 108 (90 Base) MCG/ACT inhaler Inhale 1-2 puffs into the lungs  every 6 (six) hours as needed for wheezing or shortness of breath.   Alcohol Swabs (ALCOHOL PADS) 70 % PADS SMARTSIG:Pledget(s) Topical 4 Times Daily   apixaban (ELIQUIS) 5 MG TABS tablet Take 1 tablet (5 mg total) by mouth 2 (two) times daily.   ARNUITY ELLIPTA 100 MCG/ACT AEPB Inhale 1 puff into the lungs daily.   Ascorbic Acid (VITAMIN C) 1000 MG tablet Take 500 mg by mouth daily.   atorvastatin (LIPITOR) 80 MG tablet Take 1 tablet (80 mg total) by mouth daily.   bisoprolol (ZEBETA) 10 MG tablet TAKE 1 TABLET(10 MG) BY MOUTH IN THE MORNING AND AT BEDTIME   Blood Glucose Calibration (ACCU-CHEK GUIDE CONTROL) LIQD See admin instructions.   Blood Glucose Monitoring Suppl (ACCU-CHEK GUIDE ME) w/Device KIT 4 (four) times daily.   Cetirizine HCl 10 MG CAPS Take 10 mg by mouth daily.   Cholecalciferol (VITAMIN D) 2000 units tablet Take 4,000 Units by mouth daily.    cimetidine (TAGAMET) 200 MG tablet Take 0.5 tablets (100 mg total) by mouth daily as needed. (Patient taking differently: Take 100 mg by mouth daily as needed (indigestion).)   CINNAMON PO Take 1-2 capsules by mouth 3 (three) times daily.   clobetasol ointment (TEMOVATE) 0.05 % Apply 1 application  topically as needed (imflammation).   DULoxetine (CYMBALTA) 60 MG capsule Take 60 mg by mouth 2 (two) times daily.   EASY COMFORT PEN NEEDLES 31G X 5 MM MISC INJECT IN SULIN EVERY DAY AS DIRECTED   empagliflozin (JARDIANCE) 10 MG TABS tablet Take 1 tablet (10 mg total) by mouth daily.   Evolocumab (REPATHA SURECLICK) 140 MG/ML SOAJ Inject 140 mg into the skin every 14 (fourteen) days.   fluticasone (FLONASE) 50 MCG/ACT nasal spray SMARTSIG:2 Spray(s) Both Nares Daily PRN   guaiFENesin (ROBITUSSIN) 100 MG/5ML SOLN Take 5 mLs (100 mg total) by mouth every 4 (four) hours as needed for cough or to loosen phlegm. (Patient taking differently: Take 5 mLs by mouth as needed for cough or to loosen phlegm.)   HUMALOG KWIKPEN 100 UNIT/ML KwikPen  ADMINISTER 5 TO 11 UNITS UNDER THE SKIN THREE TIMES DAILY PER SLIDING SCALE   hydrOXYzine (ATARAX/VISTARIL) 25 MG tablet Take 25 mg by mouth every 8 (eight) hours as needed for itching.  insulin degludec (TRESIBA FLEXTOUCH) 100 UNIT/ML FlexTouch Pen Inject 20 Units into the skin at bedtime.   Ipratropium-Albuterol (COMBIVENT RESPIMAT IN) Inhale 2 puffs into the lungs.   KERENDIA 10 MG TABS Take 1 tablet by mouth daily.   Lancet Devices (EASY MINI EJECT LANCING DEVICE) MISC 4 (four) times daily.   levothyroxine (SYNTHROID) 100 MCG tablet Take 1 tablet (100 mcg total) by mouth daily before breakfast.   lidocaine (LIDODERM) 5 % Place 1 patch onto the skin daily. Remove & Discard patch within 12 hours or as directed by MD   lubiprostone (AMITIZA) 24 MCG capsule Take 1 capsule (24 mcg total) by mouth 2 (two) times daily with a meal. (Patient taking differently: Take 24 mcg by mouth 2 (two) times daily as needed for constipation.)   LYRICA 75 MG capsule Take 75 mg by mouth 2 (two) times daily.    methocarbamol (ROBAXIN) 500 MG tablet Take 1 tablet (500 mg total) by mouth 3 (three) times daily as needed. (Patient taking differently: Take 500 mg by mouth 3 (three) times daily as needed for muscle spasms.)   montelukast (SINGULAIR) 10 MG tablet Take 10 mg by mouth at bedtime.   pantoprazole (PROTONIX) 40 MG tablet Take 1 tablet (40 mg total) by mouth daily before breakfast.   RESTASIS 0.05 % ophthalmic emulsion Place 1 drop into both eyes 2 (two) times daily.   Simethicone 125 MG CAPS Take 1 capsule every day by oral route as needed.   telmisartan (MICARDIS) 20 MG tablet Take 40 mg by mouth daily.   Tiotropium Bromide-Olodaterol (STIOLTO RESPIMAT) 2.5-2.5 MCG/ACT AERS Inhale 2 puffs into the lungs daily.   Tiotropium Bromide-Olodaterol (STIOLTO RESPIMAT) 2.5-2.5 MCG/ACT AERS Inhale 2 puffs into the lungs daily.   tirzepatide (MOUNJARO) 10 MG/0.5ML Pen Inject 10 mg into the skin once a week.   torsemide  (DEMADEX) 20 MG tablet Take 2 tablets (40 mg total) by mouth daily. (Patient taking differently: Take 20 mg by mouth daily.)   traMADol (ULTRAM) 50 MG tablet Take 50 mg by mouth 3 (three) times daily as needed for moderate pain.   traZODone (DESYREL) 150 MG tablet Take 150 mg by mouth at bedtime.   ursodiol (ACTIGALL) 300 MG capsule Take 300 mg by mouth 2 (two) times daily.   vitamin B-12 (CYANOCOBALAMIN) 1000 MCG tablet Take 1,000 mcg by mouth daily.                  Past Medical History:  Diagnosis Date   Anemia    Arthritis    Asthma    COPD (chronic obstructive pulmonary disease) (HCC)    Deep vein thrombosis (DVT) of both lower extremities (HCC) 06/27/2015   Diabetes mellitus    Fibromyalgia    GERD (gastroesophageal reflux disease)    H/O hiatal hernia    Hypercholesteremia    Hypertension    Hyperthyroidism    IBS (irritable bowel syndrome)    Inner ear disease    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   PONV (postoperative nausea and vomiting)    Tachycardia        Objective:   Wts  04/29/2023       236  03/19/2023       228  05/27/2022       230   01/30/2021     228  07/02/2020       228   06/25/20 226 lb (102.5 kg)  06/18/20 223 lb (101.2 kg)  03/18/20 222  lb 14.2 oz (101.1 kg)    Vital signs reviewed  04/29/2023  - Note at rest 02 sats  98% on 2lpm cont    General appearance:   w/c bound obese bf nad   HEENT : Oropharynx  clear      Nasal turbinates nl    NECK :  without  apparent JVD/ palpable Nodes/TM    LUNGS: no acc muscle use,  Nl contour chest which is clear to A and P bilaterally without cough on insp or exp maneuvers   CV:  RRR  no s3 or murmur or increase in P2, and pitting edema both LEs  ABD:  soft and nontender   MS:    ext warm without deformities Or obvious joint restrictions  calf tenderness, cyanosis or clubbing    SKIN: warm and dry without lesions    NEURO:  alert, approp, nl sensorium with  no motor  or cerebellar deficits apparent.      CXR PA and Lateral:   04/29/2023 :    I personally reviewed images and impression is as follows:     CM/ low lung volumes, mild int edema with  ? Silvana Newness Bs              Assessment

## 2023-04-29 ENCOUNTER — Encounter: Payer: Self-pay | Admitting: Internal Medicine

## 2023-04-29 ENCOUNTER — Ambulatory Visit: Payer: 59 | Admitting: Internal Medicine

## 2023-04-29 ENCOUNTER — Telehealth: Payer: Self-pay | Admitting: Cardiology

## 2023-04-29 ENCOUNTER — Ambulatory Visit (HOSPITAL_COMMUNITY)
Admission: RE | Admit: 2023-04-29 | Discharge: 2023-04-29 | Disposition: A | Discharge status: Home / Self Care | Source: Ambulatory Visit | Attending: Internal Medicine | Admitting: Internal Medicine

## 2023-04-29 VITALS — BP 127/81 | HR 102 | Wt 236.2 lb

## 2023-04-29 DIAGNOSIS — R0609 Other forms of dyspnea: Secondary | ICD-10-CM | POA: Diagnosis present

## 2023-04-29 DIAGNOSIS — Z87891 Personal history of nicotine dependence: Secondary | ICD-10-CM

## 2023-04-29 DIAGNOSIS — J9611 Chronic respiratory failure with hypoxia: Secondary | ICD-10-CM

## 2023-04-29 DIAGNOSIS — Z8616 Personal history of COVID-19: Secondary | ICD-10-CM

## 2023-04-29 DIAGNOSIS — U071 COVID-19: Secondary | ICD-10-CM

## 2023-04-29 NOTE — Telephone Encounter (Signed)
 Pt c/o swelling/edema: STAT if pt has developed SOB within 24 hours  If swelling, where is the swelling located? Legs   How much weight have you gained and in what time span? 7 lbs in a week  Have you gained 2 pounds in a day or 5 pounds in a week? 7 lbs in a week   Do you have a log of your daily weights (if so, list)?   3/6: 229 3/13: 236 lbs   Are you currently taking a fluid pill? Yes   Are you currently SOB? Did not sound SOB currently but has been SOB lately  Have you traveled recently in a car or plane for an extended period of time? No  Pt saw Pulmonary today and was told to f/u with Dr. Diona Browner due to swelling and SOB. Please advise.

## 2023-04-29 NOTE — Patient Instructions (Addendum)
 Also  Ok to try albuterol 15 min before an activity (on alternating days with the nebulizer vs nothing )  that you know would usually make you short of breath and see if it makes any difference and if makes none then don't take albuterol after activity unless you can't catch your breath as this means it's the resting that helps, not the albuterol.      Make sure you check your oxygen saturation  AT  your highest level of activity (not after you stop)   to be sure it stays over 90% and adjust  02 flow upward to maintain this level if needed but remember to turn it back to previous settings when you stop (to conserve your supply).   My office will be contacting you by phone for referral to echo   - if you don't hear back from my office within one week please call us back or notify us thru MyChart and we'll address it right away.    Call Dr Diona Browner for follow up on the fluid problems    Please remember to go to the  x-ray department  @  [facility] for your tests - we will call you with the results when they are available      Please schedule a follow up visit in 3 months but call sooner if needed

## 2023-04-29 NOTE — Telephone Encounter (Signed)
 Pt c/o SOB that started in Nov when she had COVID. Pt on oxygen at 3 L. The swelling started off and on since Nov. Swelling became worse last night. Pt sleeping on 4 pillows at night. She is having difficulty walking d/t swelling. She is due for an iron infusion on Monday. Was seen by pulmonary today and told to F/U with cardiology d/t swelling and SOB. Scheduled for Echo 05/12/23. Pt has been taking 1/2 to 1 tablet of torsemide daily. Please advise.

## 2023-04-30 ENCOUNTER — Other Ambulatory Visit (HOSPITAL_COMMUNITY): Payer: Self-pay | Admitting: Nurse Practitioner

## 2023-04-30 DIAGNOSIS — R7989 Other specified abnormal findings of blood chemistry: Secondary | ICD-10-CM

## 2023-04-30 DIAGNOSIS — R748 Abnormal levels of other serum enzymes: Secondary | ICD-10-CM

## 2023-04-30 DIAGNOSIS — K7581 Nonalcoholic steatohepatitis (NASH): Secondary | ICD-10-CM

## 2023-04-30 DIAGNOSIS — K7402 Hepatic fibrosis, advanced fibrosis: Secondary | ICD-10-CM

## 2023-04-30 NOTE — Telephone Encounter (Signed)
 Returned call to pt and informed her of Dr.McDowell's response. Pt reports that she did take 1 tablet of Torsemide on yesterday and is starting to feel better.

## 2023-05-01 NOTE — Assessment & Plan Note (Addendum)
 Onset 03/17/20 with covid 19 pneumonia -  No desats walking in office 06/18/2020 on 2lpm   -  07/02/2020   Walked RA  approx   200 ft  @ slow pace  stopped due to  Back pain /fatigue with sats still  97% Admit Nov 22 24 [facility] p covid d/c on 02 3- 3.5 lpm  - 04/29/2023   Walked on 2lpm   x  one   lap(s) =  approx 150  ft  @ very slow  pace, stopped due to sob/fatigue   with lowest 02 sats 92%     Advise: Make sure you check your oxygen saturation  AT  your highest level of activity (not after you stop)   to be sure it stays over 90% and adjust  02 flow upward to maintain this level if needed but remember to turn it back to previous settings when you stop (to conserve your supply).   Each maintenance medication was reviewed in detail including emphasizing most importantly the difference between maintenance and prns and under what circumstances the prns are to be triggered using an action plan format where appropriate.  Total time for H and P, chart review, counseling, reviewing hfa/ neb device(s) , directly observing portions of ambulatory 02 saturation study/ and generating customized AVS unique to this office visit / same day charting =  35 min for multiple  refractory respiratory  symptoms of uncertain etiology

## 2023-05-01 NOTE — Assessment & Plan Note (Addendum)
 Quit smoking 2015  -  Echo 03/16/20  GI  diastolic dysfunction only  -  07/22/4401   Walked    approx   100 ft  @ slow pace  stopped due to  Sob with sats 99% on 2lpm    -  Repeat echo 06/28/20  Low nl EF, diastolic dysfunction, mild RV dysfunction  - PFT's  09/17/20  FEV1 2.25 (102 % ) ratio 0.85 p 0 % improvement from saba p anoro prior to study with DLCO  11.63 (52%) corrects to 3.20 (78%)  for alv volume and FV curve flattened top of ext curve   - 01/30/2021   Walked on RA  x  One   lap(s) =  approx 250  ft  @ slow pace, stopped due to leg pain with lowest 02 sats 94%  - 05/27/2022   Walked on RA  x  1  lap(s) =  approx 150  ft  @ slow/cane pace, stopped due to legs/balance gave out  with lowest 02 sats 98%   - 03/19/2023  After extensive coaching inhaler device,  effectiveness =    75% with smi so try stiolto 2 each am and reduce multiple sabas > no change   Clinical impression is obesity/deconditioning and chf > echo next step   Again advised: Re SABA :  I spent extra time with pt today reviewing appropriate use of albuterol for prn use on exertion with the following points: 1) saba is for relief of sob that does not improve by walking a slower pace or resting but rather if the pt does not improve after trying this first. 2) If the pt is convinced, as many are, that saba helps recover from activity faster then it's easy to tell if this is the case by re-challenging : ie stop, take the inhaler, then p 5 minutes try the exact same activity (intensity of workload) that just caused the symptoms and see if they are substantially diminished or not after saba 3) if there is an activity that reproducibly causes the symptoms, try the saba 15 min before the activity on alternate days   If in fact the saba really does help, then fine to continue to use it prn but advised may need to look closer at the maintenance regimen being used to achieve better control of airways disease with exertion.

## 2023-05-01 NOTE — Assessment & Plan Note (Addendum)
 Stephanie [name]

## 2023-05-03 ENCOUNTER — Inpatient Hospital Stay

## 2023-05-03 VITALS — BP 118/81 | HR 98 | Temp 97.1°F | Resp 18

## 2023-05-03 DIAGNOSIS — N1831 Chronic kidney disease, stage 3a: Secondary | ICD-10-CM | POA: Diagnosis not present

## 2023-05-03 DIAGNOSIS — D472 Monoclonal gammopathy: Secondary | ICD-10-CM | POA: Diagnosis not present

## 2023-05-03 DIAGNOSIS — D649 Anemia, unspecified: Secondary | ICD-10-CM

## 2023-05-03 DIAGNOSIS — D509 Iron deficiency anemia, unspecified: Secondary | ICD-10-CM | POA: Diagnosis not present

## 2023-05-03 MED ORDER — SODIUM CHLORIDE 0.9 % IV SOLN
50.0000 mg | Freq: Once | INTRAVENOUS | Status: AC
  Administered 2023-05-03: 50 mg via INTRAVENOUS
  Filled 2023-05-03: qty 1

## 2023-05-03 MED ORDER — FAMOTIDINE IN NACL 20-0.9 MG/50ML-% IV SOLN
20.0000 mg | Freq: Once | INTRAVENOUS | Status: AC
  Administered 2023-05-03: 20 mg via INTRAVENOUS

## 2023-05-03 MED ORDER — SODIUM CHLORIDE 0.9 % IV SOLN
950.0000 mg | Freq: Once | INTRAVENOUS | Status: AC
  Administered 2023-05-03: 950 mg via INTRAVENOUS
  Filled 2023-05-03: qty 19

## 2023-05-03 MED ORDER — SODIUM CHLORIDE 0.9 % IV SOLN: INTRAVENOUS | Status: DC

## 2023-05-03 MED ORDER — ACETAMINOPHEN 325 MG PO TABS
650.0000 mg | ORAL_TABLET | Freq: Once | ORAL | Status: AC
  Administered 2023-05-03: 650 mg via ORAL
  Filled 2023-05-03: qty 2

## 2023-05-03 MED ORDER — METHYLPREDNISOLONE SODIUM SUCC 125 MG IJ SOLR
125.0000 mg | Freq: Once | INTRAMUSCULAR | Status: AC
  Administered 2023-05-03: 125 mg via INTRAVENOUS
  Filled 2023-05-03: qty 2

## 2023-05-03 MED ORDER — CETIRIZINE HCL 10 MG/ML IV SOLN
10.0000 mg | Freq: Once | INTRAVENOUS | Status: AC
Start: 2023-05-03 — End: 2023-05-03
  Administered 2023-05-03: 10 mg via INTRAVENOUS
  Filled 2023-05-03: qty 1

## 2023-05-03 NOTE — Progress Notes (Signed)
 Patient tolerated iron infusion with no complaints voiced.  Peripheral IV site clean and dry with good blood return noted before and after infusion.  Band aid applied.  VSS with discharge and left in satisfactory condition with no s/s of distress noted.

## 2023-05-03 NOTE — Patient Instructions (Signed)
 Iron Dextran Injection What is this medication? IRON DEXTRAN (EYE ern DEX tran) treats low levels of iron in your body. Iron is a mineral that plays an important role in making red blood cells, which carry oxygen from your lungs to the rest of your body. This medicine may be used for other purposes; ask your health care provider or pharmacist if you have questions. COMMON BRAND NAME(S): Dexferrum, INFeD What should I tell my care team before I take this medication? They need to know if you have any of these conditions: Anemia not caused by low iron levels Heart disease High levels of iron in the blood Kidney disease Liver disease An unusual or allergic reaction to iron, other medications, foods, dyes, or preservatives Pregnant or trying to get pregnant Breastfeeding How should I use this medication? This medication is injected into a vein or a muscle. It is given by your care team in a hospital or clinic setting. Talk to your care team about the use of this medication in children. While it may be prescribed for children as young as 4 months for selected conditions, precautions do apply. Overdosage: If you think you have taken too much of this medicine contact a poison control center or emergency room at once. NOTE: This medicine is only for you. Do not share this medicine with others. What if I miss a dose? Keep appointments for follow-up doses. It is important not to miss your dose. Call your care team if you are unable to keep an appointment. What may interact with this medication? Do not take this medication with any of the following: Deferoxamine Dimercaprol Other iron products This medication may also interact with the following: Chloramphenicol Deferasirox This list may not describe all possible interactions. Give your health care provider a list of all the medicines, herbs, non-prescription drugs, or dietary supplements you use. Also tell them if you smoke, drink alcohol, or use  illegal drugs. Some items may interact with your medicine. What should I watch for while using this medication? Visit your care team for regular checks on your progress. Tell your care team if your symptoms do not start to get better or if they get worse. You may need blood work while taking this medication. You may need to eat more foods that contain iron. Talk to your care team. Foods that contain iron include whole grains/cereals, dried fruits, beans, peas, leafy green vegetables, and organ meats (liver, kidney). Long-term use of this medication may increase your risk of some cancers. Talk to your care team about your risk of cancer. What side effects may I notice from receiving this medication? Side effects that you should report to your care team as soon as possible: Allergic reactions--skin rash, itching, hives, swelling of the face, lips, tongue, or throat Low blood pressure--dizziness, feeling faint or lightheaded, blurry vision Shortness of breath Side effects that usually do not require medical attention (report to your care team if they continue or are bothersome): Flushing Headache Joint pain Muscle pain Nausea Pain, redness, or irritation at injection site This list may not describe all possible side effects. Call your doctor for medical advice about side effects. You may report side effects to FDA at 1-800-FDA-1088. Where should I keep my medication? This medication is given in a hospital or clinic. It will not be stored at home. NOTE: This sheet is a summary. It may not cover all possible information. If you have questions about this medicine, talk to your doctor, pharmacist, or health  care provider.  2024 Elsevier/Gold Standard (2022-09-23 00:00:00)

## 2023-05-12 ENCOUNTER — Ambulatory Visit (HOSPITAL_COMMUNITY)
Admission: RE | Admit: 2023-05-12 | Discharge: 2023-05-12 | Disposition: A | Discharge status: Home / Self Care | Source: Ambulatory Visit | Attending: Nurse Practitioner | Admitting: Nurse Practitioner

## 2023-05-12 ENCOUNTER — Ambulatory Visit (HOSPITAL_COMMUNITY)
Admission: RE | Admit: 2023-05-12 | Discharge: 2023-05-12 | Disposition: A | Discharge status: Home / Self Care | Source: Ambulatory Visit | Attending: Internal Medicine | Admitting: Internal Medicine

## 2023-05-12 DIAGNOSIS — R0609 Other forms of dyspnea: Secondary | ICD-10-CM | POA: Diagnosis present

## 2023-05-12 DIAGNOSIS — R7989 Other specified abnormal findings of blood chemistry: Secondary | ICD-10-CM | POA: Insufficient documentation

## 2023-05-12 DIAGNOSIS — K7581 Nonalcoholic steatohepatitis (NASH): Secondary | ICD-10-CM | POA: Insufficient documentation

## 2023-05-12 DIAGNOSIS — R748 Abnormal levels of other serum enzymes: Secondary | ICD-10-CM | POA: Diagnosis present

## 2023-05-12 DIAGNOSIS — K7402 Hepatic fibrosis, advanced fibrosis: Secondary | ICD-10-CM | POA: Diagnosis present

## 2023-05-12 LAB — ECHOCARDIOGRAM COMPLETE
AR max vel: 2.05 cm2
AV Area VTI: 2.54 cm2
AV Area mean vel: 2.07 cm2
AV Mean grad: 2 mmHg
AV Peak grad: 5.1 mmHg
Ao pk vel: 1.13 m/s
Area-P 1/2: 8.07 cm2
Calc EF: 53 %
MV M vel: 4.09 m/s
MV Peak grad: 66.9 mmHg
MV VTI: 2.44 cm2
S' Lateral: 2.9 cm
Single Plane A2C EF: 56.2 %
Single Plane A4C EF: 50.8 %

## 2023-05-12 NOTE — Progress Notes (Signed)
  Echocardiogram 2D Echocardiogram has been performed.  Ocie Doyne RDCS 05/12/2023, 9:21 AM

## 2023-05-14 ENCOUNTER — Telehealth: Payer: Self-pay

## 2023-05-14 ENCOUNTER — Other Ambulatory Visit: Payer: Self-pay

## 2023-05-14 DIAGNOSIS — I5030 Unspecified diastolic (congestive) heart failure: Secondary | ICD-10-CM

## 2023-05-14 NOTE — Telephone Encounter (Signed)
-----   Message from Nona Dell sent at 05/13/2023  3:44 PM EDT ----- Echocardiogram ordered by Dr. Sherene Sires and already resulted.  Please get a limited echocardiogram with Definity contrast and arrange a follow-up office visit with me or APP. ----- Message ----- From: Annabell Sabal, CMA Sent: 05/13/2023   1:13 PM EDT To: Jonelle Sidle, MD

## 2023-05-14 NOTE — Telephone Encounter (Signed)
 I spoke with daughter,Lewanda,  and informed her of limited echo apt on 05/24/23 at 0830 hrs  and f/u 5/21 at 1300 hrs with APP at [facility]

## 2023-05-24 ENCOUNTER — Ambulatory Visit (HOSPITAL_COMMUNITY)
Admission: RE | Admit: 2023-05-24 | Discharge: 2023-05-24 | Disposition: A | Discharge status: Home / Self Care | Source: Ambulatory Visit | Attending: Cardiology | Admitting: Cardiology

## 2023-05-24 DIAGNOSIS — I5030 Unspecified diastolic (congestive) heart failure: Secondary | ICD-10-CM | POA: Insufficient documentation

## 2023-05-24 DIAGNOSIS — I428 Other cardiomyopathies: Secondary | ICD-10-CM

## 2023-05-24 LAB — ECHOCARDIOGRAM LIMITED
Calc EF: 36.3 %
Est EF: 40
S' Lateral: 3.9 cm
Single Plane A2C EF: 29.5 %
Single Plane A4C EF: 40.1 %

## 2023-05-24 MED ORDER — PERFLUTREN LIPID MICROSPHERE
1.0000 mL | INTRAVENOUS | Status: AC | PRN
  Administered 2023-05-24: 5 mL via INTRAVENOUS

## 2023-05-24 NOTE — Progress Notes (Signed)
*  PRELIMINARY RESULTS* Echocardiogram Limited 2-D Echocardiogram  has been performed with Definity.  Stacey Drain 05/24/2023, 10:48 AM

## 2023-05-31 ENCOUNTER — Ambulatory Visit: Payer: 59 | Admitting: Nurse Practitioner

## 2023-06-01 ENCOUNTER — Other Ambulatory Visit: Payer: Self-pay

## 2023-06-01 DIAGNOSIS — E039 Hypothyroidism, unspecified: Secondary | ICD-10-CM

## 2023-06-02 ENCOUNTER — Ambulatory Visit: Admitting: Nurse Practitioner

## 2023-06-02 DIAGNOSIS — E039 Hypothyroidism, unspecified: Secondary | ICD-10-CM

## 2023-06-02 DIAGNOSIS — Z7984 Long term (current) use of oral hypoglycemic drugs: Secondary | ICD-10-CM

## 2023-06-02 DIAGNOSIS — I10 Essential (primary) hypertension: Secondary | ICD-10-CM

## 2023-06-02 DIAGNOSIS — Z794 Long term (current) use of insulin: Secondary | ICD-10-CM

## 2023-06-02 DIAGNOSIS — Z7985 Long-term (current) use of injectable non-insulin antidiabetic drugs: Secondary | ICD-10-CM

## 2023-06-02 DIAGNOSIS — E782 Mixed hyperlipidemia: Secondary | ICD-10-CM

## 2023-06-02 DIAGNOSIS — E1122 Type 2 diabetes mellitus with diabetic chronic kidney disease: Secondary | ICD-10-CM

## 2023-06-04 ENCOUNTER — Other Ambulatory Visit (HOSPITAL_COMMUNITY)
Admission: RE | Admit: 2023-06-04 | Discharge: 2023-06-04 | Disposition: A | Discharge status: Home / Self Care | Source: Ambulatory Visit | Attending: Family Medicine | Admitting: Family Medicine

## 2023-06-04 DIAGNOSIS — D631 Anemia in chronic kidney disease: Secondary | ICD-10-CM | POA: Diagnosis not present

## 2023-06-04 DIAGNOSIS — E559 Vitamin D deficiency, unspecified: Secondary | ICD-10-CM | POA: Insufficient documentation

## 2023-06-04 DIAGNOSIS — E785 Hyperlipidemia, unspecified: Secondary | ICD-10-CM | POA: Diagnosis not present

## 2023-06-04 DIAGNOSIS — N189 Chronic kidney disease, unspecified: Secondary | ICD-10-CM | POA: Diagnosis not present

## 2023-06-04 DIAGNOSIS — M797 Fibromyalgia: Secondary | ICD-10-CM | POA: Diagnosis not present

## 2023-06-04 DIAGNOSIS — E1122 Type 2 diabetes mellitus with diabetic chronic kidney disease: Secondary | ICD-10-CM | POA: Diagnosis not present

## 2023-06-04 DIAGNOSIS — D472 Monoclonal gammopathy: Secondary | ICD-10-CM | POA: Diagnosis present

## 2023-06-04 LAB — COMPREHENSIVE METABOLIC PANEL WITH GFR
ALT: 21 U/L (ref 0–44)
AST: 21 U/L (ref 15–41)
Albumin: 2.9 g/dL — ABNORMAL LOW (ref 3.5–5.0)
Alkaline Phosphatase: 215 U/L — ABNORMAL HIGH (ref 38–126)
Anion gap: 6 (ref 5–15)
BUN: 23 mg/dL (ref 8–23)
CO2: 22 mmol/L (ref 22–32)
Calcium: 8.8 mg/dL — ABNORMAL LOW (ref 8.9–10.3)
Chloride: 105 mmol/L (ref 98–111)
Creatinine, Ser: 1.25 mg/dL — ABNORMAL HIGH (ref 0.44–1.00)
GFR, Estimated: 46 mL/min — ABNORMAL LOW (ref >60)
Glucose, Bld: 242 mg/dL — ABNORMAL HIGH (ref 70–99)
Potassium: 3.4 mmol/L — ABNORMAL LOW (ref 3.5–5.1)
Sodium: 133 mmol/L — ABNORMAL LOW (ref 135–145)
Total Bilirubin: 0.9 mg/dL (ref 0.0–1.2)
Total Protein: 8.4 g/dL — ABNORMAL HIGH (ref 6.5–8.1)

## 2023-06-04 LAB — URINALYSIS, W/ REFLEX TO CULTURE (INFECTION SUSPECTED)
Bilirubin Urine: NEGATIVE
Glucose, UA: >=500 mg/dL — AB
Ketones, ur: NEGATIVE mg/dL
Nitrite: POSITIVE — AB
Protein, ur: >=300 mg/dL — AB
Specific Gravity, Urine: 1.023 (ref 1.005–1.030)
pH: 5 (ref 5.0–8.0)

## 2023-06-04 LAB — CBC WITH DIFFERENTIAL/PLATELET
Abs Immature Granulocytes: 0.03 10*3/uL (ref 0.00–0.07)
Basophils Absolute: 0.1 10*3/uL (ref 0.0–0.1)
Basophils Relative: 1 %
Eosinophils Absolute: 0.2 10*3/uL (ref 0.0–0.5)
Eosinophils Relative: 2 %
HCT: 41.9 % (ref 36.0–46.0)
Hemoglobin: 12.5 g/dL (ref 12.0–15.0)
Immature Granulocytes: 0 %
Lymphocytes Relative: 25 %
Lymphs Abs: 2.1 10*3/uL (ref 0.7–4.0)
MCH: 24.6 pg — ABNORMAL LOW (ref 26.0–34.0)
MCHC: 29.8 g/dL — ABNORMAL LOW (ref 30.0–36.0)
MCV: 82.5 fL (ref 80.0–100.0)
Monocytes Absolute: 0.8 10*3/uL (ref 0.1–1.0)
Monocytes Relative: 10 %
Neutro Abs: 5 10*3/uL (ref 1.7–7.7)
Neutrophils Relative %: 62 %
Platelets: 287 10*3/uL (ref 150–400)
RBC: 5.08 MIL/uL (ref 3.87–5.11)
RDW: 19.7 % — ABNORMAL HIGH (ref 11.5–15.5)
WBC: 8.1 10*3/uL (ref 4.0–10.5)
nRBC: 0 % (ref 0.0–0.2)

## 2023-06-04 LAB — LIPID PANEL
Cholesterol: 201 mg/dL — ABNORMAL HIGH (ref 0–200)
HDL: 60 mg/dL (ref >40)
LDL Cholesterol: 122 mg/dL — ABNORMAL HIGH (ref 0–99)
Total CHOL/HDL Ratio: 3.4 ratio
Triglycerides: 93 mg/dL (ref <150)
VLDL: 19 mg/dL (ref 0–40)

## 2023-06-04 LAB — HEMOGLOBIN A1C
Hgb A1c MFr Bld: 8 % — ABNORMAL HIGH (ref 4.8–5.6)
Mean Plasma Glucose: 182.9 mg/dL

## 2023-06-04 LAB — VITAMIN D 25 HYDROXY (VIT D DEFICIENCY, FRACTURES): Vit D, 25-Hydroxy: 39.29 ng/mL (ref 30–100)

## 2023-06-08 ENCOUNTER — Other Ambulatory Visit: Payer: Self-pay | Admitting: Student

## 2023-06-11 LAB — TSH: TSH: 2.62 m[IU]/L (ref 0.40–4.50)

## 2023-06-11 LAB — T4, FREE: Free T4: 1.3 ng/dL (ref 0.8–1.8)

## 2023-06-14 ENCOUNTER — Ambulatory Visit (INDEPENDENT_AMBULATORY_CARE_PROVIDER_SITE_OTHER): Admitting: Nurse Practitioner

## 2023-06-14 ENCOUNTER — Telehealth: Payer: Self-pay | Admitting: Adult Health

## 2023-06-14 ENCOUNTER — Encounter: Payer: Self-pay | Admitting: Nurse Practitioner

## 2023-06-14 VITALS — BP 114/76 | HR 75 | Ht 68.0 in | Wt 236.0 lb

## 2023-06-14 DIAGNOSIS — N1831 Chronic kidney disease, stage 3a: Secondary | ICD-10-CM

## 2023-06-14 DIAGNOSIS — I10 Essential (primary) hypertension: Secondary | ICD-10-CM

## 2023-06-14 DIAGNOSIS — Z7984 Long term (current) use of oral hypoglycemic drugs: Secondary | ICD-10-CM

## 2023-06-14 DIAGNOSIS — Z7985 Long-term (current) use of injectable non-insulin antidiabetic drugs: Secondary | ICD-10-CM | POA: Diagnosis not present

## 2023-06-14 DIAGNOSIS — E782 Mixed hyperlipidemia: Secondary | ICD-10-CM | POA: Diagnosis not present

## 2023-06-14 DIAGNOSIS — N1832 Chronic kidney disease, stage 3b: Secondary | ICD-10-CM

## 2023-06-14 DIAGNOSIS — E1122 Type 2 diabetes mellitus with diabetic chronic kidney disease: Secondary | ICD-10-CM

## 2023-06-14 DIAGNOSIS — Z794 Long term (current) use of insulin: Secondary | ICD-10-CM | POA: Diagnosis not present

## 2023-06-14 DIAGNOSIS — E039 Hypothyroidism, unspecified: Secondary | ICD-10-CM

## 2023-06-14 MED ORDER — EMPAGLIFLOZIN 10 MG PO TABS: 10.0000 mg | ORAL_TABLET | Freq: Every day | ORAL | 3 refills | Status: DC

## 2023-06-14 MED ORDER — LEVOTHYROXINE SODIUM 100 MCG PO TABS: 100.0000 ug | ORAL_TABLET | Freq: Every day | ORAL | 1 refills | Status: DC

## 2023-06-14 MED ORDER — TRESIBA FLEXTOUCH 100 UNIT/ML ~~LOC~~ SOPN: 15.0000 [IU] | PEN_INJECTOR | Freq: Every day | SUBCUTANEOUS | 3 refills | Status: DC

## 2023-06-14 MED ORDER — TIRZEPATIDE 12.5 MG/0.5ML ~~LOC~~ SOAJ: 12.5000 mg | SUBCUTANEOUS | 3 refills | Status: AC

## 2023-06-14 MED ORDER — HUMALOG KWIKPEN 100 UNIT/ML ~~LOC~~ SOPN
2.0000 [IU] | PEN_INJECTOR | Freq: Three times a day (TID) | SUBCUTANEOUS | 3 refills | Status: DC
Start: 2023-06-14 — End: 2023-10-21

## 2023-06-14 NOTE — Patient Instructions (Signed)

## 2023-06-14 NOTE — Progress Notes (Signed)
 Endocrinology Follow Up Note       06/14/2023, 3:32 PM   Subjective:    [name], female    DOB: 1950/12/25.  Stephanie [name] is being seen in follow up after being seen in consultation for management of currently uncontrolled symptomatic diabetes requested by  Jace Martinet, FNP.   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   Type 2 diabetes mellitus (HCC)     Past Surgical History:  Procedure Laterality Date   ABDOMINAL HYSTERECTOMY  partial   CARPAL TUNNEL RELEASE Right 1991   CATARACT EXTRACTION W/PHACO Right 05/08/2013   Procedure: CATARACT EXTRACTION PHACO AND INTRAOCULAR LENS PLACEMENT (IOC);  Surgeon: Anner Kill, MD;  Location: [facility];  Service: Ophthalmology;  Laterality: Right;  CDE 10.31   CATARACT EXTRACTION W/PHACO Left 08/17/2013   Procedure: CATARACT EXTRACTION PHACO AND INTRAOCULAR LENS PLACEMENT (IOC);  Surgeon: Anner Kill, MD;  Location: [facility];  Service: Ophthalmology;  Laterality: Left;  CDE:9.03   CHOLECYSTECTOMY  1971   COLONOSCOPY WITH PROPOFOL  N/A 01/06/2016   Dr. Riley Cheadle: diverticulosis    DENTAL SURGERY     ESOPHAGEAL BRUSHING  08/29/2019   Procedure: ESOPHAGEAL BRUSHING;  Surgeon: Suzette Espy, MD;  Location: [facility];  Service: Endoscopy;;   ESOPHAGOGASTRODUODENOSCOPY (EGD) WITH PROPOFOL  N/A 01/06/2016   Dr. Riley Cheadle: normal s/p empiric dilation    ESOPHAGOGASTRODUODENOSCOPY (EGD) WITH PROPOFOL  N/A  08/29/2019   esophageal plaques vs medication residue adherent to tubular esophagus s/p KOH brushing and dilation. Medium-sized hiatal hernia. + for candida. Diflucan .    EYE SURGERY     IR BONE MARROW BIOPSY & ASPIRATION  03/31/2022   MALONEY DILATION N/A 01/06/2016   Procedure: Londa Rival DILATION;  Surgeon: Suzette Espy, MD;  Location: [facility];  Service: Endoscopy;  Laterality: N/A;   MALONEY DILATION N/A 08/29/2019   Procedure: Londa Rival DILATION;  Surgeon: Suzette Espy, MD;  Location: [facility];  Service: Endoscopy;  Laterality: N/A;   REVERSE SHOULDER ARTHROPLASTY Right 08/06/2017   Procedure: RIGHT REVERSE SHOULDER ARTHROPLASTY;  Surgeon: Winston Hawking, MD;  Location: [facility] OR;  Service: Orthopedics;  Laterality: Right;   WRIST GANGLION EXCISION Left     Social History   Socioeconomic History   Marital status: Divorced    Spouse name: Not on file   Number of children: Not on file   Years of education: Not on file   Highest education level: Not on file  Occupational History   Not on file  Tobacco Use   Smoking status: Former    Current packs/day: 0.00    Average packs/day: 0.3 packs/day for 30.0  years (7.5 ttl pk-yrs)    Types: Cigarettes    Start date: 02/16/1981    Quit date: 02/17/2011    Years since quitting: 12.3   Smokeless tobacco: Never  Vaping Use   Vaping status: Never Used  Substance and Sexual Activity   Alcohol use: Not Currently    Comment: rare   Drug use: No   Sexual activity: Not Currently    Birth control/protection: Surgical  Other Topics Concern   Not on file  Social History Narrative   Pt lives with daughter    Retired    Social Drivers of Corporate investment banker Strain: Not on file  Food Insecurity: Low Risk  (04/30/2023)   Received from [facility]   Hunger Vital Sign    Worried About Running Out of Food in the Last Year: Never true    Ran Out of Food in the Last Year: Never true  Transportation Needs: No Transportation Needs  (04/30/2023)   Received from [facility]    In the past 12 months, has lack of reliable transportation kept you from medical appointments, meetings, work or from getting things needed for daily living? : No  Physical Activity: Not on file  Stress: Not on file  Social Connections: Not on file    Family History  Problem Relation Age of Onset   Hypertension Mother    Diabetes Mother    COPD Mother    Arthritis Mother    Diabetes Father    Arthritis Father    Dementia Father    CAD Father    Hypothyroidism Sister    Diabetes Brother    Stroke Paternal Aunt    Colon cancer Niece    Colon polyps Neg Hx    Sleep apnea Neg Hx     Outpatient Encounter Medications as of 06/14/2023  Medication Sig   Accu-Chek FastClix Lancets MISC Apply topically.   ACCU-CHEK GUIDE test strip 4 (four) times daily.   albuterol  (PROVENTIL ) (2.5 MG/3ML) 0.083% nebulizer solution Take 2.5 mg by nebulization every 6 (six) hours as needed for wheezing or shortness of breath.   albuterol  (VENTOLIN  HFA) 108 (90 Base) MCG/ACT inhaler Inhale 1-2 puffs into the lungs every 6 (six) hours as needed for wheezing or shortness of breath.   Alcohol Swabs (ALCOHOL PADS) 70 % PADS SMARTSIG:Pledget(s) Topical 4 Times Daily   apixaban  (ELIQUIS ) 5 MG TABS tablet Take 1 tablet (5 mg total) by mouth 2 (two) times daily.   ARNUITY ELLIPTA  100 MCG/ACT AEPB Inhale 1 puff into the lungs daily.   Ascorbic Acid  (VITAMIN C ) 1000 MG tablet Take 500 mg by mouth daily.   atorvastatin  (LIPITOR ) 80 MG tablet Take 1 tablet (80 mg total) by mouth daily.   bisoprolol  (ZEBETA ) 10 MG tablet TAKE 1 TABLET(10 MG) BY MOUTH IN THE MORNING AND AT BEDTIME   Blood Glucose Calibration (ACCU-CHEK GUIDE CONTROL) LIQD See admin instructions.   Blood Glucose Monitoring Suppl (ACCU-CHEK GUIDE ME) w/Device KIT 4 (four) times daily.   Cetirizine  HCl 10 MG CAPS Take 10 mg by mouth daily.   Cholecalciferol  (VITAMIN D ) 2000 units tablet Take  4,000 Units by mouth daily.    cimetidine  (TAGAMET ) 200 MG tablet Take 0.5 tablets (100 mg total) by mouth daily as needed. (Patient taking differently: Take 100 mg by mouth daily as needed (indigestion).)   CINNAMON PO Take 1-2 capsules by mouth 3 (three) times daily.   CIPRO  500 MG tablet Take 500 mg  by mouth 2 (two) times daily.   clobetasol  ointment (TEMOVATE ) 0.05 % Apply 1 application  topically as needed (imflammation).   DULoxetine  (CYMBALTA ) 60 MG capsule Take 60 mg by mouth 2 (two) times daily.   EASY COMFORT PEN NEEDLES 31G X 5 MM MISC INJECT IN SULIN EVERY DAY AS DIRECTED   Evolocumab  (REPATHA  SURECLICK) 140 MG/ML SOAJ Inject 140 mg into the skin every 14 (fourteen) days.   fluticasone  (FLONASE ) 50 MCG/ACT nasal spray SMARTSIG:2 Spray(s) Both Nares Daily PRN   guaiFENesin  (ROBITUSSIN) 100 MG/5ML SOLN Take 5 mLs (100 mg total) by mouth every 4 (four) hours as needed for cough or to loosen phlegm. (Patient taking differently: Take 5 mLs by mouth as needed for cough or to loosen phlegm.)   hydrOXYzine  (ATARAX /VISTARIL ) 25 MG tablet Take 25 mg by mouth every 8 (eight) hours as needed for itching.    Ipratropium-Albuterol  (COMBIVENT  RESPIMAT IN) Inhale 2 puffs into the lungs.   KERENDIA 10 MG TABS Take 1 tablet by mouth daily.   Lancet Devices (EASY MINI EJECT LANCING DEVICE) MISC 4 (four) times daily.   lidocaine  (LIDODERM ) 5 % Place 1 patch onto the skin daily. Remove & Discard patch within 12 hours or as directed by MD   lubiprostone  (AMITIZA ) 24 MCG capsule Take 1 capsule (24 mcg total) by mouth 2 (two) times daily with a meal. (Patient taking differently: Take 24 mcg by mouth 2 (two) times daily as needed for constipation.)   LYRICA  75 MG capsule Take 75 mg by mouth 2 (two) times daily.    methocarbamol  (ROBAXIN ) 500 MG tablet Take 1 tablet (500 mg total) by mouth 3 (three) times daily as needed. (Patient taking differently: Take 500 mg by mouth 3 (three) times daily as needed for muscle  spasms.)   montelukast  (SINGULAIR ) 10 MG tablet Take 10 mg by mouth at bedtime.   pantoprazole  (PROTONIX ) 40 MG tablet Take 1 tablet (40 mg total) by mouth daily before breakfast.   RESTASIS 0.05 % ophthalmic emulsion Place 1 drop into both eyes 2 (two) times daily.   Simethicone 125 MG CAPS Take 1 capsule every day by oral route as needed.   telmisartan (MICARDIS) 20 MG tablet Take 40 mg by mouth daily.   Tiotropium Bromide-Olodaterol (STIOLTO RESPIMAT ) 2.5-2.5 MCG/ACT AERS Inhale 2 puffs into the lungs daily.   Tiotropium Bromide-Olodaterol (STIOLTO RESPIMAT ) 2.5-2.5 MCG/ACT AERS Inhale 2 puffs into the lungs daily.   tirzepatide  (MOUNJARO ) 12.5 MG/0.5ML Pen Inject 12.5 mg into the skin once a week.   torsemide  (DEMADEX ) 20 MG tablet Take 2 tablets (40 mg total) by mouth daily. (Patient taking differently: Take 20 mg by mouth daily.)   traMADol  (ULTRAM ) 50 MG tablet Take 50 mg by mouth 3 (three) times daily as needed for moderate pain.   traZODone  (DESYREL ) 150 MG tablet Take 150 mg by mouth at bedtime.   ursodiol (ACTIGALL) 300 MG capsule Take 300 mg by mouth 2 (two) times daily.   vitamin B-12 (CYANOCOBALAMIN ) 1000 MCG tablet Take 1,000 mcg by mouth daily.   [DISCONTINUED] empagliflozin  (JARDIANCE ) 10 MG TABS tablet Take 1 tablet (10 mg total) by mouth daily.   [DISCONTINUED] HUMALOG  KWIKPEN 100 UNIT/ML KwikPen ADMINISTER 5 TO 11 UNITS UNDER THE SKIN THREE TIMES DAILY PER SLIDING SCALE   [DISCONTINUED] insulin  degludec (TRESIBA  FLEXTOUCH) 100 UNIT/ML FlexTouch Pen Inject 20 Units into the skin at bedtime.   [DISCONTINUED] levothyroxine  (SYNTHROID ) 100 MCG tablet Take 1 tablet (100 mcg total) by mouth daily before  breakfast.   [DISCONTINUED] tirzepatide  (MOUNJARO ) 10 MG/0.5ML Pen Inject 10 mg into the skin once a week.   empagliflozin  (JARDIANCE ) 10 MG TABS tablet Take 1 tablet (10 mg total) by mouth daily.   HUMALOG  KWIKPEN 100 UNIT/ML KwikPen Inject 2-8 Units into the skin 3 (three) times  daily.   insulin  degludec (TRESIBA  FLEXTOUCH) 100 UNIT/ML FlexTouch Pen Inject 15 Units into the skin at bedtime.   levothyroxine  (SYNTHROID ) 100 MCG tablet Take 1 tablet (100 mcg total) by mouth daily before breakfast.   No facility-administered encounter medications on file as of 06/14/2023.    ALLERGIES: Allergies  Allergen Reactions   Tetracyclines & Related Anaphylaxis and Rash   Banana Hives and Nausea And Vomiting   Penicillins Rash and Other (See Comments)    VACCINATION STATUS: Immunization History  Administered Date(s) Administered   Fluad Quad(high Dose 65+) 11/08/2018, 12/12/2020   Influenza,inj,Quad PF,6+ Mos 12/24/2015   Influenza-Unspecified 12/02/2016   Pneumococcal Polysaccharide-23 10/22/2013    Diabetes She presents for her follow-up diabetic visit. She has type 2 diabetes mellitus. Onset time: Diagnosed at approx age of 64. Her disease course has been improving. There are no hypoglycemic associated symptoms. Associated symptoms include fatigue and polyuria (on Jardiance ). Pertinent negatives for diabetes include no polydipsia. There are no hypoglycemic complications. Symptoms are stable. Diabetic complications include a CVA, heart disease and nephropathy. Risk factors for coronary artery disease include diabetes mellitus, dyslipidemia, family history, hypertension, post-menopausal and sedentary lifestyle. Current diabetic treatment includes intensive insulin  program and oral agent (monotherapy) (and Mounjaro ). She is compliant with treatment most of the time. Her weight is fluctuating minimally. She is following a generally healthy diet. When asked about meal planning, she reported none. She has not had a previous visit with a dietitian. She rarely participates in exercise. Her home blood glucose trend is decreasing steadily. Her overall blood glucose range is 140-180 mg/dl. (She presents today with her CGM and meter showing mostly at goal glycemic profile overall.  Her  most recent A1c on 4/18 was 8%, improving from last visit of 8.7%.  Analysis of her CGM shows TIR 51%, TAR 49%, TBR 0% with a GMI of 7.6%.  She notes she has not had much of an appetite recently.) An ACE inhibitor/angiotensin II receptor blocker is not being taken. She sees a podiatrist.Eye exam is current.     Review of systems  Constitutional: + stable body weight, current Body mass index is 35.88 kg/m., no fatigue, no subjective hyperthermia, no subjective hypothermia Eyes: no blurry vision, no xerophthalmia ENT: no sore throat, no nodules palpated in throat, no dysphagia/odynophagia, no hoarseness Cardiovascular: no chest pain, no shortness of breath, no palpitations, + leg swelling Respiratory: no cough, + shortness of breath, on O2 via [place] Gastrointestinal: no nausea/vomiting/diarrhea Musculoskeletal: no muscle/joint aches, WC bound Skin: no rashes, no hyperemia Neurological: no tremors, no numbness, no tingling, no dizziness Psychiatric: no depression, no anxiety  Objective:     BP 114/76 (BP Location: Right Arm, Patient Position: Sitting, Cuff Size: Large)   Pulse 75   Ht 5\' 8"  (1.727 m)   Wt 236 lb (107 kg) Comment: Patient reported, this is her last weight. Patient in wheelchair  BMI 35.88 kg/m   Wt Readings from Last 3 Encounters:  06/14/23 236 lb (107 kg)  04/29/23 236 lb 3.2 oz (107.1 kg)  03/24/23 234 lb 2.1 oz (106.2 kg)     BP Readings from Last 3 Encounters:  06/14/23 114/76  05/24/23 (!) 162/97  05/03/23 118/81     Physical Exam- Limited  Constitutional:  Body mass index is 35.88 kg/m. , not in acute distress, normal state of mind Eyes:  EOMI, no exophthalmos Musculoskeletal: no gross deformities, strength intact in all four extremities, no gross restriction of joint movements, in WC today  Skin:  no rashes, no hyperemia Neurological: no tremor with outstretched hands   Diabetic Foot Exam - Simple   No data filed      CMP ( most recent) CMP      Component Value Date/Time   NA 133 (L) 06/04/2023 1600   K 3.4 (L) 06/04/2023 1600   CL 105 06/04/2023 1600   CO2 22 06/04/2023 1600   GLUCOSE 242 (H) 06/04/2023 1600   BUN 23 06/04/2023 1600   CREATININE 1.25 (H) 06/04/2023 1600   CREATININE 0.95 02/11/2023 1445   CALCIUM  8.8 (L) 06/04/2023 1600   CALCIUM  8.9 08/04/2017 1032   PROT 8.4 (H) 06/04/2023 1600   PROT 8.1 12/16/2015 1625   ALBUMIN 2.9 (L) 06/04/2023 1600   ALBUMIN 3.7 12/16/2015 1625   AST 21 06/04/2023 1600   ALT 21 06/04/2023 1600   ALKPHOS 215 (H) 06/04/2023 1600   BILITOT 0.9 06/04/2023 1600   BILITOT 0.2 12/16/2015 1625   GFRNONAA 46 (L) 06/04/2023 1600   GFRAA 54 (L) 09/05/2019 1021     Diabetic Labs (most recent): Lab Results  Component Value Date   HGBA1C 8.0 (H) 06/04/2023   HGBA1C 8.7 (A) 09/29/2022   HGBA1C 8.2 (A) 06/23/2022   MICROALBUR 5,373.1 (H) 04/03/2021   MICROALBUR 598.9 (H) 12/04/2019     Lipid Panel ( most recent) Lipid Panel     Component Value Date/Time   CHOL 201 (H) 06/04/2023 1600   CHOL 186 03/16/2023 1015   TRIG 93 06/04/2023 1600   HDL 60 06/04/2023 1600   HDL 60 03/16/2023 1015   CHOLHDL 3.4 06/04/2023 1600   VLDL 19 06/04/2023 1600   LDLCALC 122 (H) 06/04/2023 1600   LDLCALC 109 (H) 03/16/2023 1015   LABVLDL 17 03/16/2023 1015      Lab Results  Component Value Date   TSH 2.62 06/10/2023   TSH 1.555 12/24/2022   TSH 4.374 09/17/2022   TSH 1.934 01/29/2022   TSH 1.824 10/16/2021   TSH 2.213 07/24/2021   TSH 3.483 04/03/2021   TSH 2.253 11/28/2020   TSH 0.660 09/30/2020   TSH 0.559 06/28/2020   FREET4 1.3 06/10/2023   FREET4 0.92 12/24/2022   FREET4 0.87 09/17/2022           Assessment & Plan:   1) Type 2 diabetes mellitus with stage 3a chronic kidney disease, with long-term current use of insulin  (HCC)  She presents today with her CGM and meter showing mostly at goal glycemic profile overall.  Her most recent A1c on 4/18 was 8%, improving from  last visit of 8.7%.  Analysis of her CGM shows TIR 51%, TAR 49%, TBR 0% with a GMI of 7.6%.  She notes she has not had much of an appetite recently.  Clem Latus Reckart has currently uncontrolled symptomatic type 2 DM since 73 years of age.   -Recent labs reviewed.  - I had a long discussion with her about the progressive nature of diabetes and the pathology behind its complications. -her diabetes is complicated by CHF, CKD stage 3, CVA x 5 and she remains at a high risk for more acute and chronic complications which include CAD, CVA, CKD, retinopathy, and  neuropathy. These are all discussed in detail with her.  The following Lifestyle Medicine recommendations according to American College of Lifestyle Medicine [facility]) were discussed and offered to patient and she agrees to start the journey:  A. Whole Foods, Plant-based plate comprising of fruits and vegetables, plant-based proteins, whole-grain carbohydrates was discussed in detail with the patient.   A list for source of those nutrients were also provided to the patient.  Patient will use only water or unsweetened tea for hydration. B.  The need to stay away from risky substances including alcohol, smoking; obtaining 7 to 9 hours of restorative sleep, at least 150 minutes of moderate intensity exercise weekly, the importance of healthy social connections,  and stress reduction techniques were discussed. C.  A full color page of  Calorie density of various food groups per pound showing examples of each food groups was provided to the patient.  - Nutritional counseling repeated at each appointment due to patients tendency to fall back in to old habits.  - The patient admits there is a room for improvement in their diet and drink choices. -  Suggestion is made for the patient to avoid simple carbohydrates from their diet including Cakes, Sweet Desserts / Pastries, Ice Cream, Soda (diet and regular), Sweet Tea, Candies, Chips, Cookies, Sweet Pastries,  Store Bought Juices, Alcohol in Excess of 1-2 drinks a day, Artificial Sweeteners, Coffee Creamer, and "Sugar-free" Products. This will help patient to have stable blood glucose profile and potentially avoid unintended weight gain.   - I encouraged the patient to switch to unprocessed or minimally processed complex starch and increased protein intake (animal or plant source), fruits, and vegetables.   - Patient is advised to stick to a routine mealtimes to eat 3 meals a day and avoid unnecessary snacks (to snack only to correct hypoglycemia).  - I have approached her with the following individualized plan to manage her diabetes and patient agrees:   -She is advised to lower her Tresiba  to 15 units SQ nightly, lower Humalog  to 2-8 units TID with meals (admits she has missed few doses of this) if glucose is above 90 and she is eating (Specific instructions on how to titrate insulin  dosage based on glucose readings given to patient in writing), and continue Jardiance  10 mg po daily (may need to discontinue this in the future if she has recurrent UTIs-has one currently).  Will increase her Mounjaro  to 12.5 mg SQ weekly.  The ultimate goal would be to further simplify her regimen and get her off insulin  in the future by increasing her GLP1.   -she is encouraged to continue monitoring glucose 4 times daily (using her CGM), before meals and before bed, and to call the clinic if she has readings less than 70 or above 300 for 3 tests in a row.  - she is warned not to take insulin  without proper monitoring per orders. - Adjustment parameters are given to her for hypo and hyperglycemia in writing.  - she is not a candidate for Metformin due to concurrent renal insufficiency.  - Specific targets for  A1c; LDL, HDL, and Triglycerides were discussed with the patient.  2) Blood Pressure /Hypertension:  her blood pressure is controlled to target for her age.   she is advised to continue her current medications  including Bisoprolol  10 mg p.o. daily with breakfast.  3) Lipids/Hyperlipidemia:    Review of her recent lipid panel from 01/29/22 showed controlled LDL at 52 .  she is advised  to continue Lipitor  80 mg daily at bedtime.  Side effects and precautions discussed with her.  4)  Weight/Diet:  her Body mass index is 35.88 kg/m.  -  clearly complicating her diabetes care.   she is a candidate for weight loss. I discussed with her the fact that loss of 5 - 10% of her  current body weight will have the most impact on her diabetes management.  Exercise, and detailed carbohydrates information provided  -  detailed on discharge instructions.  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    Her previsit TFTs are consistent with appropriate hormone replacement.  She is advised to continue her Levothyroxine  100 mcg po daily before breakfast.     - The correct intake of thyroid  hormone (Levothyroxine , Synthroid ), is on empty stomach first thing in the morning, with water, separated by at least 30 minutes from breakfast and other medications,  and separated by more than 4 hours from calcium , iron , multivitamins, acid reflux medications (PPIs).  - This medication is a life-long medication and will be needed to correct thyroid  hormone imbalances for the rest of your life.  The dose may change from time to time, based on thyroid  blood work.  - It is extremely important to be consistent taking this medication, near the same time each morning.  -AVOID TAKING PRODUCTS CONTAINING BIOTIN (commonly found in Hair, Skin, Nails vitamins) AS IT INTERFERES WITH THE VALIDITY OF THYROID  FUNCTION BLOOD TESTS.  6) Chronic Care/Health Maintenance: -she is not on ACEI/ARB and is on Statin medications and is encouraged to initiate and continue to follow up with Ophthalmology, Dentist, Podiatrist at least yearly  or according to recommendations, and advised to stay away from smoking. I have recommended yearly flu vaccine and pneumonia vaccine at least every 5 years; moderate intensity exercise for up to 150 minutes weekly; and sleep for at least 7 hours a day.  - she is advised to maintain close follow up with [place], Tenika, FNP for primary care needs, as well as her other providers for optimal and coordinated care.    I spent  52  minutes in the care of the patient today including review of labs from CMP, Lipids, Thyroid  Function, Hematology (current and previous including abstractions from other facilities); face-to-face time discussing  her blood glucose readings/logs, discussing hypoglycemia and hyperglycemia episodes and symptoms, medications doses, her options of short and long term treatment based on the latest standards of care / guidelines;  discussion about incorporating lifestyle medicine;  and documenting the encounter. Risk reduction counseling performed per USPSTF guidelines to reduce obesity and cardiovascular risk factors.     Please refer to Patient Instructions for Blood Glucose Monitoring and Insulin /Medications Dosing Guide"  in media tab for additional information. Please  also refer to " Patient Self Inventory" in the Media  tab for reviewed elements of pertinent patient history.  Bahati Zettel Brunn participated in the discussions, expressed understanding, and voiced agreement with the above plans.  All questions were answered to her satisfaction. she is encouraged to contact clinic should she have any questions or concerns prior to her return visit.     Follow up plan: - Return in about 4 months (around 10/14/2023) for Diabetes F/U with A1c in office, No previsit labs, Bring meter and logs.   Hulon Magic, [facility] [address] [place], [place] [address]  Phone: [phone] Fax: [phone]  06/14/2023, 3:32 PM

## 2023-06-14 NOTE — Telephone Encounter (Signed)
 Order is in epic. It was placed in January. Will send to [facility] in case for whatever reason they did not receive.

## 2023-06-14 NOTE — Telephone Encounter (Signed)
 Pt states [facility] is waiting for the needed Rx for her CPAP supplies

## 2023-06-15 ENCOUNTER — Other Ambulatory Visit: Payer: Self-pay

## 2023-06-15 MED ORDER — FREESTYLE LIBRE 3 READER DEVI: 0 refills | Status: AC

## 2023-06-15 MED ORDER — FREESTYLE LIBRE 3 PLUS SENSOR MISC: 1 refills | Status: DC

## 2023-06-17 ENCOUNTER — Other Ambulatory Visit: Payer: Self-pay | Admitting: *Deleted

## 2023-06-17 DIAGNOSIS — Z7984 Long term (current) use of oral hypoglycemic drugs: Secondary | ICD-10-CM

## 2023-06-17 DIAGNOSIS — E1122 Type 2 diabetes mellitus with diabetic chronic kidney disease: Secondary | ICD-10-CM

## 2023-06-17 DIAGNOSIS — Z794 Long term (current) use of insulin: Secondary | ICD-10-CM

## 2023-06-17 MED ORDER — FREESTYLE LIBRE 3 PLUS SENSOR MISC: 1 refills | Status: AC

## 2023-06-18 ENCOUNTER — Telehealth: Payer: Self-pay | Admitting: *Deleted

## 2023-06-18 ENCOUNTER — Other Ambulatory Visit: Payer: Self-pay | Admitting: Nurse Practitioner

## 2023-06-18 MED ORDER — FLUCONAZOLE 150 MG PO TABS: 150.0000 mg | ORAL_TABLET | Freq: Once | ORAL | 0 refills | Status: AC

## 2023-06-18 MED ORDER — NYSTATIN 100000 UNIT/GM EX POWD: 1.0000 | Freq: Three times a day (TID) | CUTANEOUS | 0 refills | Status: DC

## 2023-06-18 NOTE — Telephone Encounter (Signed)
 Patient left a message, she shared that she she has a real bad yeast infection and needs something sent in to her pharmacy. I called her back and she confirmed that it was both vaginal and below on skin. Patient is taking Jardiance .

## 2023-06-18 NOTE — Telephone Encounter (Signed)
 Patient was called and made aware to stop the Jardiance  and that there was medicaation that has been sent in for her to the [facility].

## 2023-06-18 NOTE — Telephone Encounter (Signed)
STOP JARDIANCE

## 2023-06-25 ENCOUNTER — Encounter: Payer: Self-pay | Admitting: Cardiology

## 2023-06-25 ENCOUNTER — Ambulatory Visit: Attending: Cardiology | Admitting: Cardiology

## 2023-06-25 VITALS — BP 128/76 | HR 108 | Ht 68.0 in | Wt 236.0 lb

## 2023-06-25 DIAGNOSIS — N1832 Chronic kidney disease, stage 3b: Secondary | ICD-10-CM | POA: Diagnosis not present

## 2023-06-25 DIAGNOSIS — E782 Mixed hyperlipidemia: Secondary | ICD-10-CM | POA: Diagnosis not present

## 2023-06-25 DIAGNOSIS — I10 Essential (primary) hypertension: Secondary | ICD-10-CM

## 2023-06-25 DIAGNOSIS — Z79899 Other long term (current) drug therapy: Secondary | ICD-10-CM

## 2023-06-25 DIAGNOSIS — I5022 Chronic systolic (congestive) heart failure: Secondary | ICD-10-CM

## 2023-06-25 MED ORDER — TORSEMIDE 20 MG PO TABS: 40.0000 mg | ORAL_TABLET | Freq: Every day | ORAL | 1 refills | Status: DC

## 2023-06-25 NOTE — Progress Notes (Signed)
 Cardiology Office Note  Date: 06/25/2023   ID: Stephanie [name], DOB 01/21/1951, MRN [id number]  History of Present Illness: Stephanie [name] is a 73 y.o. female last assessed via telehealth encounter in December 2024 by Ms. Strader PA-C, I reviewed her note.  She was hospitalized with COVID-19 in November 2024, LVEF at that time was in the range of 50 to 55%.  Since then she has had continued shortness of breath with fluid retention, follow-up echocardiogram more recently in April indicated LVEF approximately 40% with low normal RV contraction.  Chart indicates follow-up with Dr. Waymond Hailey in March, I reviewed the note.  Her chest x-ray at that time indicated small to moderate right and small left pleural effusions with chronic appearing cardiomegaly and vascular congestion.  She also had peribronchial thickening.  She is in a wheelchair today.  Reports NYHA class III dyspnea, fluctuating fluid retention in her legs mainly, is on supplemental oxygen  via nasal cannula.  She does not report any chest pain.  We went over her medications, she is on multiple.  I reviewed her cardiovascular regimen.  She has been taking Demadex  generally 20 mg a day sometimes alternating with 40 mg every other day.  Her weight is up likely somewhere between 5 and 10 pounds over the last 6 months.  Physical Exam: VS:  BP 128/76 (BP Location: Left Arm, Patient Position: Sitting)   Pulse (!) 108   Ht 5\' 8"  (1.727 m)   Wt 236 lb (107 kg)   SpO2 99%   BMI 35.88 kg/m , BMI Body mass index is 35.88 kg/m.  Wt Readings from Last 3 Encounters:  06/25/23 236 lb (107 kg)  06/14/23 236 lb (107 kg)  04/29/23 236 lb 3.2 oz (107.1 kg)    General: Patient appears comfortable at rest. HEENT: Conjunctiva and lids normal. Neck: Supple, no elevated JVP or carotid bruits. Lungs: Decreased breath sounds at the bases, no wheezing. Cardiac: Regular rate and rhythm, +S3, soft systolic murmur, no pericardial rub. Extremities: 2+ firm leg  edema.  ECG:  An ECG dated 01/08/2023 was personally reviewed today and demonstrated:  Sinus tachycardia with nonspecific ST changes.  Labwork: 01/09/2023: Magnesium  1.6 02/11/2023: Brain Natriuretic Peptide 1,213 06/04/2023: ALT 21; AST 21; BUN 23; Creatinine, Ser 1.25; Hemoglobin 12.5; Platelets 287; Potassium 3.4; Sodium 133 06/10/2023: TSH 2.62     Component Value Date/Time   CHOL 201 (H) 06/04/2023 1600   CHOL 186 03/16/2023 1015   TRIG 93 06/04/2023 1600   HDL 60 06/04/2023 1600   HDL 60 03/16/2023 1015   CHOLHDL 3.4 06/04/2023 1600   VLDL 19 06/04/2023 1600   LDLCALC 122 (H) 06/04/2023 1600   LDLCALC 109 (H) 03/16/2023 1015   Other Studies Reviewed Today:  Chest x-ray 05/16/2023: IMPRESSION: 1. Chronic cardiomegaly with vascular congestion. 2. Peribronchial thickening may be bronchitic or congestive. 3. Small to moderate right and small left pleural effusions, similar to prior CT allowing for differences in modality.  Echocardiogram 05/24/2023:  1. Left ventricular ejection fraction, by estimation, is 40%. The left  ventricle has moderately decreased function. The left ventricle  demonstrates global hypokinesis.   2. Right ventricular systolic function is low normal. The right  ventricular size is mildly enlarged.   3. The inferior vena cava is normal in size with greater than 50%  respiratory variability, suggesting right atrial pressure of 3 mmHg.   Assessment and Plan:  1.  HFmrEF, most recent LVEF approximately 40% as of April, RV  contraction normal at that time.  Patient had COVID-19 in November 2024, has generally had continued shortness of breath and also fluid retention since that time.  LVEF has declined in the last 6 months.  Plan is to increase Demadex  to 40 mg daily for now.  She is on Mounjaro  12.5 mg / 0.4 mL weekly, Micardis 20 mg daily, Kerendia 10 mg daily, and Zebeta  10 mg twice daily.  Plan to reassess over the next 4 to 6 weeks, recheck BMET at that  time.  May need to reevaluate LVEF with limited echocardiogram depending on how she responds to diuretics and does clinically overall.   2.  History of recurrent lower extremity DVT, chronically anticoagulated with Eliquis  and followed by the hematology clinic.   3.  Mixed hyperlipidemia, recent LDL 122 in April.  She is on Lipitor  80 mg daily and has been started on Repatha  140 mg/mL every 14 days in the interim.  Can plan on following up FLP later in the year.   4.  Primary hypertension.  Continue with regimen discussed above.   5.  CKD stage IIIb, she follows with nephrology.  Creatinine 1.25 with GFR 46 in April.  Disposition:  Follow up 4 to 6 weeks.  Signed, Gerard Knight, M.D., F.A.C.C. [facility]

## 2023-06-25 NOTE — Patient Instructions (Signed)
 Medication Instructions:   INCREASE Torsemide  to 40 mg daily   Labwork: BMET in 1 month  Testing/Procedures: None today  Follow-Up: 4-6 weeks   Any Other Special Instructions Will Be Listed Below (If Applicable).  If you need a refill on your cardiac medications before your next appointment, please call your pharmacy.

## 2023-07-07 ENCOUNTER — Ambulatory Visit: Admitting: Medical

## 2023-07-20 ENCOUNTER — Telehealth: Payer: Self-pay | Admitting: *Deleted

## 2023-07-20 NOTE — Telephone Encounter (Signed)
 Patient left a message , asking for a call back. Patient was called , she wanted to be sure when her next office appointment was. It is September the  4 th at 1:30 pm.

## 2023-08-01 NOTE — Progress Notes (Unsigned)
 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   Admit date: 03/17/2020 Discharge date: 03/22/2020       Recommendations for Outpatient Follow-up:  Repeat basic metabolic panel to evaluate lites and renal function Reassess blood pressure and adjust antihypertensive treatment as needed Continue to closely follow CBGs and adjust hypoglycemic regimen as required Please reassess the need for oxygen  supplementation. Repeat CXR in 6 weeks to assure complete resolution of infiltrates.     Discharge Diagnoses:  Principal Problem:   Acute respiratory disease due to COVID-19 virus   Hypertension   COPD (chronic obstructive pulmonary disease) (HCC)   MGUS (monoclonal gammopathy of unknown significance)   Pneumonia due to COVID-19 virus   COVID-19   Chronic diastolic CHF (congestive heart failure) (HCC)     History of present illness:  Stephanie [name] is a 73 y.o. female with medical history significant of Covid pneumonia just discharged today to home from the hospital after completing remdesivir  on steroids.  Patient normally is on 2 L of oxygen  and was discharged on 6 L with exertion but at rest was only requiring 4 L.  She was transferred by ambulance home by the time she got home she was extremely short of breath and hypoxic.  Patient was readmitted for acute hypoxemic respiratory failure in the setting of Covid pneumonia.  Pulmonology following.   Hospital Course:  Acute hypoxemic respiratory failure secondary to COVID-19 pneumonia -Continue steroids and wean off O2 supplementation as tolerated.    -currently using 2L at rest and 3.5-4L on exertion -continue slow steroids tapering. -Procalcitonin low, not needing antibiotics currently -Continue  breathing treatments as needed -Appreciate pulmonology consultation and rec's   Acute COPD exacerbation related to above -IV steroids and breathing treatments as noted above -no wheezing on exam.   Hypokalemia -Repleted -safe to resume diuretics and daily supplementation. -follow electrolytes trend   Type 2 diabetes with hyperglycemia-improved -Resume home adjusted hypoglycemic regimen -advise to follow low carb diet. -Recent A1c of 9.2% -expecting improvement in her CBG's while tapering off steroids.    CKD stage IIIa with AKI -Improved and back to baseline at discharge -Safe to resume the use of metolazone  and Demadex  -Continue to maintain adequate hydration -Repeat basic metabolic panel at follow-up visit to assess electrolytes and renal function and stability.   Hypertension -stable -follow heart healthy diet. -Resume home antihypertensive agents.   Hypothyroidism -Continue Synthroid    Class 1 Obesity -low calorie diet, portion control and increase physical activity recommended.  -Body mass index is 33.89 kg/m.   Chronic diastolic heart failure -Stable and compensated -Continue to follow daily weights, low-sodium diet and resume home diuretic regimen.       History of Present Illness  06/18/2020  Pulmonary/ 1st office eval/ Stephanie [name] / [facility]  Chief Complaint  Patient presents with   Pulmonary Consult    Referred by Dr Mason Sole.  Pt had covid 06 Mar 2020- admitted to [facility] and sent home with supplemental o2. She gets winded with exertion such as cooking and cleaning her home. She uses her albuterol  inhaler 2 x daily on average.   Dyspnea:  Room to room at home / scooter for shopping due to back > breathing on  2lpm  Cough: worse since covid / prednisone  rx per PCP x 4 more days Sleep: flat bed with 3 pillows worse cough  SABA use: twice daily as above   rec Stop metaprolol  Bisoprolol  5 mg twice daily - call med to fine tune it.  Change pantoprazole  40 mg  Take 30- 60 min before your first and last meals of the day  GERD Only use your albuterol  as a rescue medication  Make sure you check your oxygen  saturation  at your highest level of activity  to be sure it stays over 90%   Please schedule a follow up office visit in 2 weeks, call sooner if needed with all medications /inhalers/ solutions in hand      PFT's  09/17/20  FEV1 2.25 (102 % ) ratio 0.85 p 0 % improvement from saba (off anoro) prior to study with DLCO  11.63 (52%) corrects to 3.20 (78%)  for alv volume and FV curve flattened top of ext curve  erv not done,  needs cxr to correlate lung vol low       Admit Jan 08 2023 [facility] p covid d/c on 02 3- 3.5 lpm    03/19/2023    re-establish post covid  ov/[place] office/Stephanie [name] re: resp failure post covid  maint on multiple saba's  Chief Complaint  Patient presents with   Follow-up  Dyspnea:  limited by arthritis  f/b Stephanie [name]  Cough: has spell twice daily > varies color but always thick  Sleeping: on side flat bed two pillows  s  resp cc  SABA use: combivent  bid /albterol inhaler/ and then duoneb  02: 02 2 lpm hs and sometimes needs 3lpm during the day with sats 84% at rest.  Rec Plan A = Automatic = Always=    Stiolto 2 pffs  each am  Plan B = Backup (to supplement plan A, not to replace it) Only use your albuterol  inhaler as a rescue medication Plan C = Crisis (instead of Plan B but only if Plan B stops working) - only use your albuterol  nebulizer if you first try Plan B Also  Ok to try albuterol  15 min before an activity (on alternating days)  that you know would usually make you short of breath Make sure you check your oxygen  saturation  AT  your highest level of activity (not after you stop)   to be sure it stays over 90%   We will refer you to [facility] for best fit for portable 02    04/29/2023  f/u ov/[place] office/Stephanie [name] re: chronic resp failure  maint on 2 lpm   Chief Complaint  Patient presents with   Follow-up    Stephanie [name]  6  week follow up    Dyspnea:  one room to next  25 ft Droke Cough: worse in am's > min mucoid  Sleeping: flat bed  3 pillows resp cc  sev times a week gets up in chair do sleep due to sob  SABA use: too much / no change on or off stiolto  02: 2lpm but not checking Rec Also  Ok to try albuterol  15 min before an activity (on alternating days with the nebulizer vs nothing )  that you know would usually make you short of breath    Make sure you check your oxygen  saturation  AT  your highest level of activity (not after you stop)   to be sure it stays over 90% - ECHO  05/12/2023  mod decreased fxn,  TDS >refer back to cards   Call Dr Londa Rival for follow up on the fluid problems    08/03/2023  f/u ov/[place] office/Stephanie [name] re: Mol  maint on 3lpm 24/7  / stiolto 2 each am  Chief Complaint  Patient presents with   Follow-up    [place] - desat    Dyspnea:  improving  Cough: better Sleeping: flat bed/ 3 pillows better  but till wakes up once a week SABA use: no neb/ inhaler maybe in evening  02: 3lpm  24/7   Lung cancer screening: nov 2024 ct no nodules > referred to [facility] program 08/03/2023   No obvious day to day or daytime variability or assoc excess/ purulent sputum or mucus plugs or hemoptysis or cp or chest tightness, subjective wheeze or overt   hb symptoms.    Also denies any obvious fluctuation of symptoms with weather or environmental changes or other aggravating or alleviating factors except as outlined above   No unusual exposure hx or h/o childhood pna/ asthma or knowledge of premature birth.  Current Allergies, Complete Past Medical History, Past Surgical History, Family History, and Social History were reviewed in [employer] record.  ROS  The following are not active complaints unless bolded Hoarseness, sore throat, dysphagia, dental problems, itching, sneezing,  nasal congestion or discharge of excess mucus or purulent secretions, ear ache,   fever, chills,  sweats, unintended wt loss or wt gain, classically pleuritic or exertional cp,  orthopnea pnd or arm/hand swelling  or leg swelling, presyncope, palpitations, abdominal pain, anorexia, nausea, vomiting, diarrhea  or change in bowel habits or change in bladder habits, change in stools or change in urine, dysuria, hematuria,  rash, arthralgias, visual complaints, headache, numbness, weakness or ataxia or problems with walking or coordination,  change in mood or  memory.        Current Meds - - NOTE:   Unable to verify as accurately reflecting what pt takes    Medication Sig   Accu-Chek FastClix Lancets MISC Apply topically.   ACCU-CHEK GUIDE test strip 4 (four) times daily.   albuterol  (PROVENTIL ) (2.5 MG/3ML) 0.083% nebulizer solution Take 2.5 mg by nebulization every 6 (six) hours as needed for wheezing or shortness of breath.   albuterol  (VENTOLIN  HFA) 108 (90 Base) MCG/ACT inhaler Inhale 1-2 puffs into the lungs every 6 (six) hours as needed for wheezing or shortness of breath.   Alcohol Swabs (ALCOHOL PADS) 70 % PADS SMARTSIG:Pledget(s) Topical 4 Times Daily   apixaban  (ELIQUIS ) 5 MG TABS tablet Take 1 tablet (5 mg total) by mouth 2 (two) times daily.   ARNUITY ELLIPTA  100 MCG/ACT AEPB Inhale 1 puff into the lungs daily.   Ascorbic Acid  (VITAMIN C ) 1000 MG tablet Take 500 mg by mouth daily.   atorvastatin  (LIPITOR ) 80 MG tablet Take 1 tablet (80 mg total) by mouth daily.   bisoprolol  (ZEBETA ) 10 MG tablet TAKE 1 TABLET(10 MG) BY MOUTH IN THE MORNING AND AT BEDTIME   Blood Glucose Calibration (ACCU-CHEK GUIDE CONTROL) LIQD See admin instructions.   Blood Glucose Monitoring Suppl (ACCU-CHEK GUIDE ME) w/Device KIT 4 (four) times daily.   Cetirizine  HCl 10 MG CAPS Take 10 mg by mouth daily.   Cholecalciferol  (VITAMIN D ) 2000 units tablet Take 4,000 Units by mouth daily.    cimetidine  (TAGAMET ) 200 MG tablet Take 0.5 tablets (100 mg total) by mouth daily as needed. (Patient taking differently: Take  100 mg by mouth daily as needed (indigestion).)   CINNAMON  PO Take 1-2 capsules by mouth 3 (three) times daily.   clobetasol  ointment (TEMOVATE ) 0.05 % Apply 1 application  topically as needed (imflammation).   Continuous Glucose Receiver (FREESTYLE LIBRE 3 READER) DEVI USE TO MONITOR GLUCOSE CONTINUOUSLY AS DIRECTED   Continuous Glucose Sensor (FREESTYLE LIBRE 3 PLUS SENSOR) MISC USE TO MONITOR GLUCOSE CONTINUOUSLY AS DIRECTED. CHANGE SENSOR EVERY 15 DAYS.   DULoxetine  (CYMBALTA ) 60 MG capsule Take 60 mg by mouth 2 (two) times daily.   EASY COMFORT PEN NEEDLES 31G X 5 MM MISC INJECT IN SULIN EVERY DAY AS DIRECTED   Evolocumab  (REPATHA  SURECLICK) 140 MG/ML SOAJ Inject 140 mg into the skin every 14 (fourteen) days.   fluticasone  (FLONASE ) 50 MCG/ACT nasal spray SMARTSIG:2 Spray(s) Both Nares Daily PRN   guaiFENesin  (ROBITUSSIN) 100 MG/5ML SOLN Take 5 mLs (100 mg total) by mouth every 4 (four) hours as needed for cough or to loosen phlegm. (Patient taking differently: Take 5 mLs by mouth as needed for cough or to loosen phlegm.)   HUMALOG  KWIKPEN 100 UNIT/ML KwikPen Inject 2-8 Units into the skin 3 (three) times daily.   hydrOXYzine  (ATARAX /VISTARIL ) 25 MG tablet Take 25 mg by mouth every 8 (eight) hours as needed for itching.    insulin  degludec (TRESIBA  FLEXTOUCH) 100 UNIT/ML FlexTouch Pen Inject 15 Units into the skin at bedtime.   Ipratropium-Albuterol  (COMBIVENT  RESPIMAT IN) Inhale 2 puffs into the lungs.   KERENDIA 10 MG TABS Take 1 tablet by mouth daily.   Lancet Devices (EASY MINI EJECT LANCING DEVICE) MISC 4 (four) times daily.   levothyroxine  (SYNTHROID ) 100 MCG tablet Take 1 tablet (100 mcg total) by mouth daily before breakfast.   lidocaine  (LIDODERM ) 5 % Place 1 patch onto the skin daily. Remove & Discard patch within 12 hours or as directed by MD   lubiprostone  (AMITIZA ) 24 MCG capsule Take 1 capsule (24 mcg total) by mouth 2 (two) times daily with a meal. (Patient taking differently:  Take 24 mcg by mouth 2 (two) times daily as needed for constipation.)   LYRICA  75 MG capsule Take 75 mg by mouth 2 (two) times daily.    methocarbamol  (ROBAXIN ) 500 MG tablet Take 1 tablet (500 mg total) by mouth 3 (three) times daily as needed. (Patient taking differently: Take 500 mg by mouth 3 (three) times daily as needed for muscle spasms.)   montelukast  (SINGULAIR ) 10 MG tablet Take 10 mg by mouth at bedtime.   nystatin  (MYCOSTATIN /NYSTOP ) powder Apply 1 Application topically 3 (three) times daily.   pantoprazole  (PROTONIX ) 40 MG tablet Take 1 tablet (40 mg total) by mouth daily before breakfast.   RESTASIS 0.05 % ophthalmic emulsion Place 1 drop into both eyes 2 (two) times daily.   sacubitril-valsartan (ENTRESTO) 24-26 MG Take 1 tablet by mouth 2 (two) times daily.   Simethicone 125 MG CAPS Take 1 capsule every day by oral route as needed.   Tiotropium Bromide-Olodaterol (STIOLTO RESPIMAT ) 2.5-2.5 MCG/ACT AERS Inhale 2 puffs into the lungs daily.   Tiotropium Bromide-Olodaterol (STIOLTO RESPIMAT ) 2.5-2.5 MCG/ACT AERS Inhale 2 puffs into the lungs daily.   tirzepatide  (MOUNJARO ) 12.5 MG/0.5ML Pen Inject 12.5 mg into the skin once a week.   torsemide  (DEMADEX ) 20 MG tablet Take 2 tablets (40 mg total) by mouth daily.   traMADol  (ULTRAM ) 50 MG tablet Take 50 mg by mouth 3 (three) times daily as needed for moderate pain.   traZODone  (DESYREL ) 150 MG tablet Take 150 mg by mouth at bedtime.   ursodiol (ACTIGALL)  300 MG capsule Take 300 mg by mouth 2 (two) times daily.   vitamin B-12 (CYANOCOBALAMIN ) 1000 MCG tablet Take 1,000 mcg by mouth daily.                  Past Medical History:  Diagnosis Date   Anemia    Arthritis    Asthma    COPD (chronic obstructive pulmonary disease) (HCC)    Deep vein thrombosis (DVT) of both lower extremities (HCC) 06/27/2015   Diabetes mellitus    Fibromyalgia    GERD (gastroesophageal reflux disease)    H/O hiatal hernia    Hypercholesteremia     Hypertension    Hyperthyroidism    IBS (irritable bowel syndrome)    Inner ear disease    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   PONV (postoperative nausea and vomiting)    Tachycardia        Objective:   Wts  08/03/2023       241  04/29/2023       236  03/19/2023       228  05/27/2022       230   01/30/2021     228  07/02/2020       228   06/25/20 226 lb (102.5 kg)  06/18/20 223 lb (101.2 kg)  03/18/20 222 lb 14.2 oz (101.1 kg)    Vital signs reviewed  08/03/2023  - Note at rest 02 sats  98% on 3lpm    General appearance:    w/c bound elderly bf all smiles today    HEENT : Oropharynx  clear      Nasal turbinates nl    NECK :  without  apparent JVD/ palpable Nodes/TM    LUNGS: no acc muscle use,  Nl contour chest which is clear to A and P bilaterally without cough on insp or exp maneuvers   CV:  RRR  no s3 or murmur or increase in P2, and trace pitting edema L > R LE   ABD:  soft and nontender   MS:   ext warm without deformities Or obvious joint restrictions  calf tenderness, cyanosis or clubbing    SKIN: warm and dry without lesions    NEURO:  alert, approp, nl sensorium with  no motor or cerebellar deficits apparent.       CXR PA and Lateral:   04/29/2023 :    I personally reviewed images and impression is as follows:     CM/ low lung volumes, mild int edema with  ? Ema Hands Bs Over read:  1. Chronic cardiomegaly with vascular congestion. 2. Peribronchial thickening may be bronchitic or congestive. 3. Small to moderate right and small left pleural effusions, similar to prior CT allowing for differences in modality.             Assessment

## 2023-08-02 ENCOUNTER — Other Ambulatory Visit (HOSPITAL_COMMUNITY)
Admission: RE | Admit: 2023-08-02 | Discharge: 2023-08-02 | Disposition: A | Discharge status: Home / Self Care | Source: Ambulatory Visit | Attending: Cardiology | Admitting: Cardiology

## 2023-08-02 ENCOUNTER — Ambulatory Visit: Payer: 59 | Attending: Cardiology | Admitting: Cardiology

## 2023-08-02 ENCOUNTER — Encounter: Payer: Self-pay | Admitting: Cardiology

## 2023-08-02 ENCOUNTER — Ambulatory Visit: Admitting: Internal Medicine

## 2023-08-02 ENCOUNTER — Ambulatory Visit: Payer: Self-pay | Admitting: Cardiology

## 2023-08-02 VITALS — BP 132/80 | HR 95 | Ht 68.0 in | Wt 240.2 lb

## 2023-08-02 DIAGNOSIS — Z79899 Other long term (current) drug therapy: Secondary | ICD-10-CM | POA: Diagnosis present

## 2023-08-02 DIAGNOSIS — N1832 Chronic kidney disease, stage 3b: Secondary | ICD-10-CM | POA: Diagnosis not present

## 2023-08-02 DIAGNOSIS — I5022 Chronic systolic (congestive) heart failure: Secondary | ICD-10-CM | POA: Diagnosis not present

## 2023-08-02 DIAGNOSIS — E782 Mixed hyperlipidemia: Secondary | ICD-10-CM | POA: Diagnosis not present

## 2023-08-02 DIAGNOSIS — Z86718 Personal history of other venous thrombosis and embolism: Secondary | ICD-10-CM

## 2023-08-02 LAB — BASIC METABOLIC PANEL WITH GFR
Anion gap: 11 (ref 5–15)
BUN: 19 mg/dL (ref 8–23)
CO2: 26 mmol/L (ref 22–32)
Calcium: 8.7 mg/dL — ABNORMAL LOW (ref 8.9–10.3)
Chloride: 99 mmol/L (ref 98–111)
Creatinine, Ser: 1.31 mg/dL — ABNORMAL HIGH (ref 0.44–1.00)
GFR, Estimated: 43 mL/min — ABNORMAL LOW (ref >60)
Glucose, Bld: 172 mg/dL — ABNORMAL HIGH (ref 70–99)
Potassium: 4.3 mmol/L (ref 3.5–5.1)
Sodium: 136 mmol/L (ref 135–145)

## 2023-08-02 NOTE — Progress Notes (Signed)
    Cardiology Office Note  Date: 08/02/2023   ID: DANIELL PARADISE, DOB [birth year], MRN [id number]  History of Present Illness: Stephanie [name] is a 73 y.o. female last seen in May.  She is here today with family member for a follow-up visit.  States that she feels less fluid retention, mainly in her legs, although weight has not changed substantially.  Still a general feeling of abdominal fullness.  We went over her medications.  She had a follow-up BMET this morning with results pending.  We discussed further potential changes in GDMT.  She has been taking Demadex  40 mg each morning.  Physical Exam: VS:  BP 132/80   Pulse 95   Ht 5' 8 (1.727 m)   Wt 240 lb 3.2 oz (109 kg)   SpO2 99%   BMI 36.52 kg/m , BMI Body mass index is 36.52 kg/m.  Wt Readings from Last 3 Encounters:  08/02/23 240 lb 3.2 oz (109 kg)  06/25/23 236 lb (107 kg)  06/14/23 236 lb (107 kg)    General: Patient appears comfortable at rest. HEENT: Conjunctiva and lids normal. Neck: Supple, no carotid bruits. Lungs: Clear to auscultation, nonlabored breathing at rest. Cardiac: Regular rate and rhythm, no S3, soft systolic murmur. Extremities: 2+ leg edema, less firm.  ECG:  An ECG dated 01/08/2023 was personally reviewed today and demonstrated:  Sinus tachycardia with nonspecific ST changes.  Labwork: 01/09/2023: Magnesium  1.6 02/11/2023: Brain Natriuretic Peptide 1,213 06/04/2023: ALT 21; AST 21; BUN 23; Creatinine, Ser 1.25; Hemoglobin 12.5; Platelets 287; Potassium 3.4; Sodium 133 06/10/2023: TSH 2.62     Component Value Date/Time   CHOL 201 (H) 06/04/2023 1600   CHOL 186 03/16/2023 1015   TRIG 93 06/04/2023 1600   HDL 60 06/04/2023 1600   HDL 60 03/16/2023 1015   CHOLHDL 3.4 06/04/2023 1600   VLDL 19 06/04/2023 1600   LDLCALC 122 (H) 06/04/2023 1600   LDLCALC 109 (H) 03/16/2023 1015   Other Studies Reviewed Today:  Echocardiogram 05/24/2023:  1. Left ventricular ejection fraction, by estimation, is 40%.  The left  ventricle has moderately decreased function. The left ventricle  demonstrates global hypokinesis.   2. Right ventricular systolic function is low normal. The right  ventricular size is mildly enlarged.   3. The inferior vena cava is normal in size with greater than 50%  respiratory variability, suggesting right atrial pressure of 3 mmHg.   Assessment and Plan:  1.  HFmrEF, most recent LVEF approximately 40% as of April, RV contraction normal at that time.  Follow-up BMET results pending from this morning.  She is currently on bisoprolol  10 mg twice daily, Kerendia 10 mg daily, Micardis 20 mg daily, Mounjaro  12.5 mg weekly, and Demadex  40 mg daily.  Anticipate switching from Micardis to Entresto and potentially further advancing Demadex .  Will follow-up on lab work first.   2.  History of recurrent lower extremity DVT, chronically anticoagulated with Eliquis  and followed by the hematology clinic.   3.  Mixed hyperlipidemia, recent LDL 122 in April.  She is on Lipitor  80 mg daily and has been started on Repatha  140 mg/mL every 14 days in the interim.   4.  Primary hypertension.   5.  CKD stage IIIb, she follows with nephrology.  Creatinine 1.25 with GFR 46 in April.  Follow-up BMET pending.  Disposition:  Follow up 3 months.  Signed, Gerard Knight, M.D., F.A.C.C. [facility]

## 2023-08-02 NOTE — Patient Instructions (Signed)
 Medication Instructions:  Your physician recommends that you continue on your current medications as directed. Please refer to the Current Medication list given to you today.   Labwork: none  Testing/Procedures: none  Follow-Up: 3 months  Any Other Special Instructions Will Be Listed Below (If Applicable).  If you need a refill on your cardiac medications before your next appointment, please call your pharmacy.

## 2023-08-03 ENCOUNTER — Encounter: Payer: Self-pay | Admitting: Internal Medicine

## 2023-08-03 ENCOUNTER — Ambulatory Visit (INDEPENDENT_AMBULATORY_CARE_PROVIDER_SITE_OTHER): Admitting: Internal Medicine

## 2023-08-03 VITALS — BP 119/77 | HR 90 | Ht 68.0 in | Wt 241.2 lb

## 2023-08-03 DIAGNOSIS — Z87891 Personal history of nicotine dependence: Secondary | ICD-10-CM

## 2023-08-03 DIAGNOSIS — J9611 Chronic respiratory failure with hypoxia: Secondary | ICD-10-CM

## 2023-08-03 DIAGNOSIS — R0609 Other forms of dyspnea: Secondary | ICD-10-CM

## 2023-08-03 MED ORDER — ENTRESTO 24-26 MG PO TABS: 1.0000 | ORAL_TABLET | Freq: Two times a day (BID) | ORAL | 11 refills | Status: DC

## 2023-08-03 NOTE — Assessment & Plan Note (Signed)
 Onset 03/17/20 with covid 19 pneumonia -  No desats walking in office 06/18/2020 on 2lpm   -  07/02/2020   Walked RA  approx   200 ft  @ slow pace  stopped due to  Back pain /fatigue with sats still  97% Admit Nov 22 24 [facility] p covid d/c on 02 3- 3.5 lpm  - 04/29/2023   Walked on 2lpm   x  one   lap(s) =  approx 150  ft  @ very slow  pace, stopped due to sob/fatigue   with lowest 02 sats 92%    Advised to adjust daytime 02 both at home and away for sats > 90% as c/o electric bill too high and runing 3lplm with sats 98% at rest

## 2023-08-03 NOTE — Assessment & Plan Note (Signed)
 Quit smoking 2015  -  Echo 03/16/20  GI  diastolic dysfunction only  -  02/21/1094   Walked    approx   100 ft  @ slow pace  stopped due to  Sob with sats 99% on 2lpm    -  Repeat echo 06/28/20  Low nl EF, diastolic dysfunction, mild RV dysfunction  - PFT's  09/17/20  FEV1 2.25 (102 % ) ratio 0.85 p 0 % improvement from saba p anoro prior to study with DLCO  11.63 (52%) corrects to 3.20 (78%)  for alv volume and FV curve flattened top of ext curve   - 01/30/2021   Walked on RA  x  One   lap(s) =  approx 250  ft  @ slow pace, stopped due to leg pain with lowest 02 sats 94%  - 05/27/2022   Walked on RA  x  1  lap(s) =  approx 150  ft  @ slow/cane pace, stopped due to legs/balance gave out  with lowest 02 sats 98%   - 03/19/2023  After extensive coaching inhaler device,  effectiveness =    75% with smi so try stiolto 2 each am and reduce multiple sabas > no change  - ECHO  05/12/2023  mod decreased fxn,  TDS >refer back to cards  Improved Naas/ orthopnea and pnd p diuretics adjusted > continue f/u by cards

## 2023-08-03 NOTE — Telephone Encounter (Signed)
 Daughter agrees with stopping telmisartan and starting entresto   Escribed to [facility] with 30 day free trial coupon

## 2023-08-03 NOTE — Assessment & Plan Note (Signed)
 Quit smoking 2013 > Referred 08/03/2023 for LCS   Low-dose CT lung cancer screening is recommended for patients who are 39-73 years of age with a 20+ pack-year history of smoking and who are currently smoking or quit <=15 years ago. No coughing up blood  No unintentional weight loss of > 15 pounds in the last 6 months - pt is eligible for scanning yearly until 2028  / advised  F/u q 6 m,sooner prn    Each maintenance medication was reviewed in detail including emphasizing most importantly the difference between maintenance and prns and under what circumstances the prns are to be triggered using an action plan format where appropriate.  Total time for H and P, chart review, counseling, reviewing hfa/SMI/neb/02 /pulse ox  device(s) and generating customized AVS unique to this office visit / same day charting = 30 min

## 2023-08-03 NOTE — Telephone Encounter (Signed)
-----   Message from Teddie Favre sent at 08/02/2023 12:05 PM EDT ----- Results reviewed.  Follow-up lab work shows potassium 4.3 and creatinine 1.31 which is relatively stable, GFR 43.  Suggest switching from telmisartan to Entresto 24/26 mg twice daily.  Keep diuretics  the same for now.  Follow-up in place. ----- Message ----- From: Dannis Dy, [facility] [place] Sent: 08/02/2023  11:27 AM EDT To: Gerard Knight, MD

## 2023-08-03 NOTE — Patient Instructions (Addendum)
 My office will be contacting you by phone for referral to lung cancer screening   [phone]) - if you don't hear back from my office within one week,  please call us  back or notify us  thru MyChart and we'll address it right away.     Make sure you check your oxygen  saturation  AT  your highest level of activity (not after you stop)   to be sure it stays over 90% and adjust  02 flow upward to maintain this level if needed but remember to turn it back to previous settings when you stop (to conserve your supply).   Please schedule a follow up visit in 6  months but call sooner if needed

## 2023-08-12 NOTE — Telephone Encounter (Signed)
 Pt called in to confirm which meds were changed

## 2023-08-18 ENCOUNTER — Encounter: Payer: Self-pay | Admitting: Emergency Medicine

## 2023-08-31 ENCOUNTER — Inpatient Hospital Stay: Admitting: Hematology

## 2023-08-31 NOTE — Progress Notes (Incomplete)
 [facility] [address], [place] [address]    Clinic Day:  08/31/2023  Referring physician: Vick Lurie, FNP  Patient Care Team: Stephanie Lurie, FNP as PCP - General (Family Medicine) Stephanie Jayson [name], [name] as PCP - Cardiology (Cardiology) Stephanie Lamar HERO, [name] as Consulting Physician (Gastroenterology) Stephanie Agent, [name] (Inactive) (Internal Medicine) Stephanie Benton PARAS, NP as Nurse Practitioner (Nurse Practitioner) Stephanie Ozell NOVAK, [name] as Consulting Physician (Pulmonary Disease)   ASSESSMENT & PLAN:   Assessment: 1.  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 - History of nephrotic range proteinuria, resolved now. - Skeletal survey (03/10/2022): Possible mottled lucency of the anterior aspect of the L1 vertebral body and mildly increased lucency of the inferior endplate of L2, requires additional dedicated cross-sectional imaging for better characterization CT lumbar spine (04/07/2022): Lytic lesion along superior endplate of L2 vertebral body and may be degenerative in etiology.  No definite suspicious lytic or blastic osseous lesions. MRI lumbar spine (05/08/2022) was negative for evidence of multiple myeloma, but showed multilevel Schmorl's nodes felt to account for lucency within the L2 superior endplate seen on CT, as well as mildly progressive multilevel spondylosis. - Most recent myeloma panel (07/09/2022) continues to show labs trending upwards: M-spike trending upwards at 2.0 (significantly elevated from M spike around 1.3 one year ago) Immunofixation PENDING Free light chains (last checked 03/10/2022) remain elevated but relatively stable with elevated kappa 790.4, normal lambda 19.0, and stable but elevated ratio 41.60. No CRAB features: Calcium  8.6.  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  Normal Hgb 12.5. LDH normal - Bone marrow biopsy (03/31/2022): Hypercellular bone marrow for age with plasmacytosis (plasma cells 13% of all cells).  Minor B-cell population with kappa light chain excess.  Per pathology report, unclear whether findings represent lymphoid component of a neoplastic lymphoplasmacytic disorder or an independent disease process such as monoclonal B-cell lymphocytosis. Molecular pathology was negative for any abnormal mutations Cytogenetics showed normal female karyotype (46,XX[20]) MYD88 mutation detected [c.794T>C (p.L265P)] - this mutation favors diagnosis of lymphoplasmacytic lymphoma, and may indicate higher risk of disease progression and greater disease burden     2.  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 - She is on Eliquis .  3.  Leukocytosis: - Intermittent leukocytosis since at least 2012 - Denies any chronic steroid use. - She is a non-smoker - BCR-ABL FISH is negative.  JAK2 with reflex is negative   4.  Microcytic anemia, RESOLVED - Previously known to be iron  deficient. - Last received IV iron  on 06/25/2017 and 07/16/2017.    5. CKD stage IIIa - Renal biopsy (08/29/2021) consistent with kidney disease from diabetes and hypertension - Baseline creatinine 1.1-1.4    Plan: 1.  IgG kappa smoldering myeloma/lymphoplasmacytic lymphoma: - CT CAP (09/21/2022): No evidence of lymphadenopathy or metastatic disease. - BMBX (03/31/2022): Hypercellular marrow with plasma cells 13% displaying kappa light chain restriction.  Significant lymphoid aggregate/lymphocyte is not seen.  Flow cytometry shows minor B-cell population representing 3% of cells with kappa light chain excess.  Lymphoid findings are atypical and similar to those from 2017.  It is not clear if it is competent of lymphoplasmacytic lymphoma or smoldering myeloma with monoclonal B-cell lymphocytosis.  Multiple myeloma FISH panel was normal.  Cytogenetics  normal. - MYD 88 was positive favoring lymphoplasmacytic lymphoma. -  She had COVID in November and still has shortness of breath, chest tightness and expectoration and follows up with Dr.Wert. - Denies any fevers, night sweats or significant weight loss. - Reviewed labs from 03/04/2023: Creatinine 1.40 and at baseline.  Low albumin is stable at 2.5.  LDH is minimally elevated at 195.  M spike is 1.7 and stable. - No indications for treatment at this time.  Will do close monitoring in 4 months with repeat labs.   2.  Bilateral DVT: - Continue Eliquis .  No bleeding issues.  3.  Normocytic anemia: - Denies any BRBPR/melena. - Will check ferritin and iron  panel today.  Will also check B12 and folic acid .  If they are low, we discussed parenteral iron  therapy with INFeD  1 g IV x 1.  We discussed side effects including rare chance of anaphylactic reaction.  We will premedicate her with steroids.  Addendum: - Ferritin is 42 and percent saturation 12.  B12 and folic acid  is normal.  Will proceed with iron  infusion.    No orders of the defined types were placed in this encounter.     Stephanie [name],acting as a Neurosurgeon for Stephanie Stands, [name].,have documented all relevant documentation on the behalf of Stephanie [name],as directed by  Stephanie Stands, [name] while in the presence of Stephanie Stands, [name].  ***   Stephanie [name]   7/15/20258:31 AM  CHIEF COMPLAINT:   Diagnosis: IgG kappa smoldering myeloma/lymphoplasmacytic lymphoma and history of DVT    Cancer Staging  No matching staging information was found for the patient.    Prior Therapy: none  Current Therapy:  surveillance; Eliquis    HISTORY OF PRESENT ILLNESS:   Oncology History   No history exists.     INTERVAL HISTORY:   Stephanie [name] is a 73 y.o. female presenting to clinic today for follow up of IgG kappa smoldering myeloma/lymphoplasmacytic lymphoma and history of DVT. She was last seen by me on  03/24/23.  Today, she states that she is doing well overall. Her appetite level is at ***%. Her energy level is at ***%.  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   Type 2 diabetes mellitus (HCC)     Surgical History: Past Surgical History:  Procedure Laterality Date   ABDOMINAL HYSTERECTOMY  partial   CARPAL TUNNEL RELEASE Right 1991   CATARACT EXTRACTION W/PHACO Right 05/08/2013   Procedure: CATARACT EXTRACTION PHACO AND INTRAOCULAR LENS PLACEMENT (IOC);  Surgeon: Cherene Mania, [name];  Location: [facility];  Service: Ophthalmology;  Laterality: Right;  CDE 10.31   CATARACT EXTRACTION W/PHACO Left 08/17/2013   Procedure: CATARACT EXTRACTION PHACO AND INTRAOCULAR LENS PLACEMENT (IOC);  Surgeon: Cherene Mania, [name];  Location: [facility];  Service: Ophthalmology;  Laterality: Left;  CDE:9.03   CHOLECYSTECTOMY  1971   COLONOSCOPY WITH PROPOFOL  N/A 01/06/2016   Dr. Shaaron: diverticulosis    DENTAL SURGERY     ESOPHAGEAL  BRUSHING  08/29/2019   Procedure: ESOPHAGEAL BRUSHING;  Surgeon: Stephanie Lamar HERO, [name];  Location: [facility];  Service: Endoscopy;;   ESOPHAGOGASTRODUODENOSCOPY (EGD) WITH PROPOFOL  N/A 01/06/2016   Dr. Shaaron: normal s/p empiric dilation    ESOPHAGOGASTRODUODENOSCOPY (EGD) WITH PROPOFOL  N/A 08/29/2019   esophageal plaques vs medication residue adherent to tubular esophagus s/p KOH brushing and dilation. Medium-sized hiatal hernia. + for candida. Diflucan .    EYE SURGERY     IR  BONE MARROW BIOPSY & ASPIRATION  03/31/2022   MALONEY DILATION N/A 01/06/2016   Procedure: AGAPITO DILATION;  Surgeon: Lamar [name] Shaaron, [name];  Location: [facility];  Service: Endoscopy;  Laterality: N/A;   MALONEY DILATION N/A 08/29/2019   Procedure: AGAPITO DILATION;  Surgeon: Stephanie Lamar HERO, [name];  Location: [facility];  Service: Endoscopy;  Laterality: N/A;   REVERSE SHOULDER ARTHROPLASTY Right 08/06/2017   Procedure: RIGHT REVERSE SHOULDER ARTHROPLASTY;  Surgeon: Kay Kemps, [name];  Location: [facility] OR;  Service: Orthopedics;  Laterality: Right;   WRIST GANGLION EXCISION Left     Social History: Social History   Socioeconomic History   Marital status: Divorced    Spouse name: Not on file   Number of children: Not on file   Years of education: Not on file   Highest education level: Not on file  Occupational History   Not on file  Tobacco Use   Smoking status: Former    Current packs/day: 0.00    Average packs/day: 0.3 packs/day for 30.0 years (7.5 ttl pk-yrs)    Types: Cigarettes    Start date: 02/16/1981    Quit date: 02/17/2011    Years since quitting: 12.5   Smokeless tobacco: Never  Vaping Use   Vaping status: Never Used  Substance and Sexual Activity   Alcohol use: Not Currently    Comment: rare   Drug use: No   Sexual activity: Not Currently    Birth control/protection: Surgical  Other Topics Concern   Not on file  Social History Narrative   Pt lives with daughter    Retired    Social Drivers of Corporate investment banker Strain: Not on file  Food Insecurity: Low Risk  (04/30/2023)   Received from [facility]   Hunger Vital Sign    Within the past 12 months, you worried that your food would run out before you got money to buy more: Never true    Within the past 12 months, the food you bought just didn't last and you didn't have money to get more. : Never true  Transportation Needs: No Transportation Needs (04/30/2023)   Received from [employer]    In the past 12 months, has lack of reliable transportation kept you from medical appointments, meetings, work or from getting things needed for daily living? : No  Physical Activity: Not on file  Stress: Not on file  Social Connections: Not on file  Intimate Partner Violence: Not At Risk (01/08/2023)   Humiliation, Afraid, Rape, and Kick questionnaire    Fear of Current or Ex-Partner: No    Emotionally Abused: No    Physically Abused: No    Sexually Abused: No    Family History: Family History  Problem Relation Age of Onset   Hypertension Mother    Diabetes Mother    COPD Mother    Arthritis Mother    Diabetes Father    Arthritis Father    Dementia Father    CAD Father  Hypothyroidism Sister    Diabetes Brother    Stroke Paternal Aunt    Colon cancer Niece    Colon polyps Neg Hx    Sleep apnea Neg Hx     Current Medications:  Current Outpatient Medications:    Accu-Chek FastClix Lancets MISC, Apply topically., Disp: , Rfl:    ACCU-CHEK GUIDE test strip, 4 (four) times daily., Disp: , Rfl:    albuterol  (PROVENTIL ) (2.5 MG/3ML) 0.083% nebulizer solution, Take 2.5 mg by nebulization every 6 (six) hours as needed for wheezing or shortness of breath., Disp: , Rfl:    albuterol  (VENTOLIN  HFA) 108 (90 Base) MCG/ACT inhaler, Inhale 1-2 puffs into the lungs every 6 (six) hours as needed for wheezing or shortness of breath., Disp: 18 g, Rfl: 0   Alcohol Swabs (ALCOHOL PADS) 70 % PADS, SMARTSIG:Pledget(s) Topical 4 Times Daily, Disp: , Rfl:    apixaban  (ELIQUIS ) 5 MG TABS tablet, Take 1 tablet (5 mg total) by mouth 2 (two) times daily., Disp: , Rfl:    ARNUITY ELLIPTA  100 MCG/ACT AEPB, Inhale 1 puff into the lungs daily., Disp: , Rfl:    Ascorbic Acid  (VITAMIN C ) 1000 MG tablet, Take 500 mg by mouth daily., Disp: , Rfl:    atorvastatin  (LIPITOR ) 80 MG tablet, Take 1 tablet (80 mg total) by mouth daily., Disp: 30 tablet, Rfl: 0   bisoprolol  (ZEBETA ) 10 MG tablet,  TAKE 1 TABLET(10 MG) BY MOUTH IN THE MORNING AND AT BEDTIME, Disp: 180 tablet, Rfl: 1   Blood Glucose Calibration (ACCU-CHEK GUIDE CONTROL) LIQD, See admin instructions., Disp: , Rfl:    Blood Glucose Monitoring Suppl (ACCU-CHEK GUIDE ME) w/Device KIT, 4 (four) times daily., Disp: , Rfl:    Cetirizine  HCl 10 MG CAPS, Take 10 mg by mouth daily., Disp: , Rfl:    Cholecalciferol  (VITAMIN D ) 2000 units tablet, Take 4,000 Units by mouth daily. , Disp: , Rfl:    cimetidine  (TAGAMET ) 200 MG tablet, Take 0.5 tablets (100 mg total) by mouth daily as needed. (Patient taking differently: Take 100 mg by mouth daily as needed (indigestion).), Disp: , Rfl:    CINNAMON PO, Take 1-2 capsules by mouth 3 (three) times daily., Disp: , Rfl:    clobetasol  ointment (TEMOVATE ) 0.05 %, Apply 1 application  topically as needed (imflammation)., Disp: , Rfl:    Continuous Glucose Receiver (FREESTYLE LIBRE 3 READER) DEVI, USE TO MONITOR GLUCOSE CONTINUOUSLY AS DIRECTED, Disp: 1 each, Rfl: 0   Continuous Glucose Sensor (FREESTYLE LIBRE 3 PLUS SENSOR) MISC, USE TO MONITOR GLUCOSE CONTINUOUSLY AS DIRECTED. CHANGE SENSOR EVERY 15 DAYS., Disp: 6 each, Rfl: 1   DULoxetine  (CYMBALTA ) 60 MG capsule, Take 60 mg by mouth 2 (two) times daily., Disp: , Rfl:    EASY COMFORT PEN NEEDLES 31G X 5 MM MISC, INJECT IN SULIN EVERY DAY AS DIRECTED, Disp: , Rfl:    Evolocumab  (REPATHA  SURECLICK) 140 MG/ML SOAJ, Inject 140 mg into the skin every 14 (fourteen) days., Disp: 2 mL, Rfl: 3   fluticasone  (FLONASE ) 50 MCG/ACT nasal spray, SMARTSIG:2 Spray(s) Both Nares Daily PRN, Disp: , Rfl:    guaiFENesin  (ROBITUSSIN) 100 MG/5ML SOLN, Take 5 mLs (100 mg total) by mouth every 4 (four) hours as needed for cough or to loosen phlegm. (Patient taking differently: Take 5 mLs by mouth as needed for cough or to loosen phlegm.), Disp: 236 mL, Rfl: 0   HUMALOG  KWIKPEN 100 UNIT/ML KwikPen, Inject 2-8 Units into the skin 3 (three) times daily., Disp: 21 mL,  Rfl: 3    hydrOXYzine  (ATARAX /VISTARIL ) 25 MG tablet, Take 25 mg by mouth every 8 (eight) hours as needed for itching. , Disp: , Rfl:    insulin  degludec (TRESIBA  FLEXTOUCH) 100 UNIT/ML FlexTouch Pen, Inject 15 Units into the skin at bedtime., Disp: 15 mL, Rfl: 3   Ipratropium-Albuterol  (COMBIVENT  RESPIMAT IN), Inhale 2 puffs into the lungs., Disp: , Rfl:    KERENDIA 10 MG TABS, Take 1 tablet by mouth daily., Disp: , Rfl:    Lancet Devices (EASY MINI EJECT LANCING DEVICE) MISC, 4 (four) times daily., Disp: , Rfl:    levothyroxine  (SYNTHROID ) 100 MCG tablet, Take 1 tablet (100 mcg total) by mouth daily before breakfast., Disp: 90 tablet, Rfl: 1   lidocaine  (LIDODERM ) 5 %, Place 1 patch onto the skin daily. Remove & Discard patch within 12 hours or as directed by [name], Disp: 30 patch, Rfl: 0   lubiprostone  (AMITIZA ) 24 MCG capsule, Take 1 capsule (24 mcg total) by mouth 2 (two) times daily with a meal. (Patient taking differently: Take 24 mcg by mouth 2 (two) times daily as needed for constipation.), Disp: 60 capsule, Rfl: 11   LYRICA  75 MG capsule, Take 75 mg by mouth 2 (two) times daily. , Disp: , Rfl:    methocarbamol  (ROBAXIN ) 500 MG tablet, Take 1 tablet (500 mg total) by mouth 3 (three) times daily as needed. (Patient taking differently: Take 500 mg by mouth 3 (three) times daily as needed for muscle spasms.), Disp: 60 tablet, Rfl: 1   montelukast  (SINGULAIR ) 10 MG tablet, Take 10 mg by mouth at bedtime., Disp: , Rfl:    nystatin  (MYCOSTATIN /NYSTOP ) powder, Apply 1 Application topically 3 (three) times daily., Disp: 15 g, Rfl: 0   pantoprazole  (PROTONIX ) 40 MG tablet, Take 1 tablet (40 mg total) by mouth daily before breakfast., Disp: 90 tablet, Rfl: 3   RESTASIS 0.05 % ophthalmic emulsion, Place 1 drop into both eyes 2 (two) times daily., Disp: , Rfl:    sacubitril-valsartan (ENTRESTO ) 24-26 MG, Take 1 tablet by mouth 2 (two) times daily., Disp: 60 tablet, Rfl: 11   Simethicone 125 MG CAPS, Take 1 capsule  every day by oral route as needed., Disp: , Rfl:    Tiotropium Bromide-Olodaterol (STIOLTO RESPIMAT ) 2.5-2.5 MCG/ACT AERS, Inhale 2 puffs into the lungs daily., Disp: 1 each, Rfl: 11   Tiotropium Bromide-Olodaterol (STIOLTO RESPIMAT ) 2.5-2.5 MCG/ACT AERS, Inhale 2 puffs into the lungs daily., Disp: 1 each, Rfl:    tirzepatide  (MOUNJARO ) 12.5 MG/0.5ML Pen, Inject 12.5 mg into the skin once a week., Disp: 6 mL, Rfl: 3   torsemide  (DEMADEX ) 20 MG tablet, Take 2 tablets (40 mg total) by mouth daily., Disp: 180 tablet, Rfl: 1   traMADol  (ULTRAM ) 50 MG tablet, Take 50 mg by mouth 3 (three) times daily as needed for moderate pain., Disp: , Rfl: 1   traZODone  (DESYREL ) 150 MG tablet, Take 150 mg by mouth at bedtime., Disp: , Rfl:    ursodiol (ACTIGALL) 300 MG capsule, Take 300 mg by mouth 2 (two) times daily., Disp: , Rfl:    vitamin B-12 (CYANOCOBALAMIN ) 1000 MCG tablet, Take 1,000 mcg by mouth daily., Disp: , Rfl:    Allergies: Allergies  Allergen Reactions   Tetracyclines & Related Anaphylaxis and Rash   Banana Hives and Nausea And Vomiting   Penicillins Rash and Other (See Comments)    REVIEW OF SYSTEMS:   Review of Systems  Constitutional:  Negative for chills, fatigue and fever.  HENT:  Negative for lump/mass, mouth sores, nosebleeds, sore throat and trouble swallowing.   Eyes:  Negative for eye problems.  Respiratory:  Negative for cough and shortness of breath.   Cardiovascular:  Negative for chest pain, leg swelling and palpitations.  Gastrointestinal:  Negative for abdominal pain, constipation, diarrhea, nausea and vomiting.  Genitourinary:  Negative for bladder incontinence, difficulty urinating, dysuria, frequency, hematuria and nocturia.   Musculoskeletal:  Negative for arthralgias, back pain, flank pain, myalgias and neck pain.  Skin:  Negative for itching and rash.  Neurological:  Negative for dizziness, headaches and numbness.  Hematological:  Does not bruise/bleed easily.   Psychiatric/Behavioral:  Negative for depression, sleep disturbance and suicidal ideas. The patient is not nervous/anxious.   All other systems reviewed and are negative.    VITALS:   There were no vitals taken for this visit.  Wt Readings from Last 3 Encounters:  08/03/23 241 lb 3.2 oz (109.4 kg)  08/02/23 240 lb 3.2 oz (109 kg)  06/25/23 236 lb (107 kg)    There is no height or weight on file to calculate BMI.  Performance status (ECOG): 1 - Symptomatic but completely ambulatory  PHYSICAL EXAM:   Physical Exam Vitals and nursing note reviewed. Exam conducted with a chaperone present.  Constitutional:      Appearance: Normal appearance.  Cardiovascular:     Rate and Rhythm: Normal rate and regular rhythm.     Pulses: Normal pulses.     Heart sounds: Normal heart sounds.  Pulmonary:     Effort: Pulmonary effort is normal.     Breath sounds: Normal breath sounds.  Abdominal:     Palpations: Abdomen is soft. There is no hepatomegaly, splenomegaly or mass.     Tenderness: There is no abdominal tenderness.  Musculoskeletal:     Right lower leg: No edema.     Left lower leg: No edema.  Lymphadenopathy:     Cervical: No cervical adenopathy.     Right cervical: No superficial, deep or posterior cervical adenopathy.    Left cervical: No superficial, deep or posterior cervical adenopathy.     Upper Body:     Right upper body: No supraclavicular or axillary adenopathy.     Left upper body: No supraclavicular or axillary adenopathy.  Neurological:     General: No focal deficit present.     Mental Status: She is alert and oriented to person, place, and time.  Psychiatric:        Mood and Affect: Mood normal.        Behavior: Behavior normal.     LABS:   CBC     Component Value Date/Time   WBC 8.1 06/04/2023 1600   RBC 5.08 06/04/2023 1600   HGB 12.5 06/04/2023 1600   HCT 41.9 06/04/2023 1600   PLT 287 06/04/2023 1600   MCV 82.5 06/04/2023 1600   MCH 24.6 (L)  06/04/2023 1600   MCHC 29.8 (L) 06/04/2023 1600   RDW 19.7 (H) 06/04/2023 1600   LYMPHSABS 2.1 06/04/2023 1600   MONOABS 0.8 06/04/2023 1600   EOSABS 0.2 06/04/2023 1600   BASOSABS 0.1 06/04/2023 1600    CMP      Component Value Date/Time   NA 136 08/02/2023 1042   K 4.3 08/02/2023 1042   CL 99 08/02/2023 1042   CO2 26 08/02/2023 1042   GLUCOSE 172 (H) 08/02/2023 1042   BUN 19 08/02/2023 1042   CREATININE 1.31 (H) 08/02/2023 1042   CREATININE 0.95 02/11/2023 1445  CALCIUM  8.7 (L) 08/02/2023 1042   CALCIUM  8.9 08/04/2017 1032   PROT 8.4 (H) 06/04/2023 1600   PROT 8.1 12/16/2015 1625   ALBUMIN 2.9 (L) 06/04/2023 1600   ALBUMIN 3.7 12/16/2015 1625   AST 21 06/04/2023 1600   ALT 21 06/04/2023 1600   ALKPHOS 215 (H) 06/04/2023 1600   BILITOT 0.9 06/04/2023 1600   BILITOT 0.2 12/16/2015 1625   GFRNONAA 43 (L) 08/02/2023 1042   GFRAA 54 (L) 09/05/2019 1021     No results found for: CEA1, CEA / No results found for: CEA1, CEA No results found for: PSA1 No results found for: CAN199 No results found for: RJW874  Lab Results  Component Value Date   TOTALPROTELP 7.6 04/15/2023   ALBUMINELP 2.6 (L) 04/15/2023   A1GS 0.3 04/15/2023   A2GS 1.0 04/15/2023   BETS 1.2 04/15/2023   GAMS 2.6 (H) 04/15/2023   MSPIKE 1.5 (H) 04/15/2023   SPEI Comment 04/15/2023   Lab Results  Component Value Date   TIBC 361 04/15/2023   TIBC 294 03/24/2023   TIBC 274 03/10/2022   FERRITIN 28 04/15/2023   FERRITIN 42 03/24/2023   FERRITIN 129 03/10/2022   IRONPCTSAT 13 04/15/2023   IRONPCTSAT 12 03/24/2023   IRONPCTSAT 35 (H) 03/10/2022   Lab Results  Component Value Date   LDH 207 (H) 04/15/2023   LDH 195 (H) 03/04/2023   LDH 139 10/26/2022     STUDIES:   No results found.

## 2023-09-01 ENCOUNTER — Encounter: Payer: Self-pay | Admitting: Emergency Medicine

## 2023-09-07 ENCOUNTER — Ambulatory Visit: Admitting: Hematology

## 2023-09-07 ENCOUNTER — Inpatient Hospital Stay: Attending: Hematology

## 2023-09-07 DIAGNOSIS — C8300 Small cell B-cell lymphoma, unspecified site: Secondary | ICD-10-CM | POA: Diagnosis present

## 2023-09-07 DIAGNOSIS — I82401 Acute embolism and thrombosis of unspecified deep veins of right lower extremity: Secondary | ICD-10-CM | POA: Insufficient documentation

## 2023-09-07 DIAGNOSIS — K760 Fatty (change of) liver, not elsewhere classified: Secondary | ICD-10-CM | POA: Insufficient documentation

## 2023-09-07 DIAGNOSIS — D472 Monoclonal gammopathy: Secondary | ICD-10-CM | POA: Diagnosis present

## 2023-09-07 DIAGNOSIS — R1012 Left upper quadrant pain: Secondary | ICD-10-CM | POA: Insufficient documentation

## 2023-09-07 DIAGNOSIS — D509 Iron deficiency anemia, unspecified: Secondary | ICD-10-CM

## 2023-09-07 DIAGNOSIS — D649 Anemia, unspecified: Secondary | ICD-10-CM | POA: Diagnosis not present

## 2023-09-07 DIAGNOSIS — Z7901 Long term (current) use of anticoagulants: Secondary | ICD-10-CM | POA: Insufficient documentation

## 2023-09-07 DIAGNOSIS — I82402 Acute embolism and thrombosis of unspecified deep veins of left lower extremity: Secondary | ICD-10-CM | POA: Diagnosis not present

## 2023-09-07 LAB — COMPREHENSIVE METABOLIC PANEL WITH GFR
ALT: 20 U/L (ref 0–44)
AST: 29 U/L (ref 15–41)
Albumin: 2.5 g/dL — ABNORMAL LOW (ref 3.5–5.0)
Alkaline Phosphatase: 264 U/L — ABNORMAL HIGH (ref 38–126)
Anion gap: 11 (ref 5–15)
BUN: 28 mg/dL — ABNORMAL HIGH (ref 8–23)
CO2: 22 mmol/L (ref 22–32)
Calcium: 8.5 mg/dL — ABNORMAL LOW (ref 8.9–10.3)
Chloride: 101 mmol/L (ref 98–111)
Creatinine, Ser: 1.29 mg/dL — ABNORMAL HIGH (ref 0.44–1.00)
GFR, Estimated: 44 mL/min — ABNORMAL LOW (ref >60)
Glucose, Bld: 235 mg/dL — ABNORMAL HIGH (ref 70–99)
Potassium: 3.8 mmol/L (ref 3.5–5.1)
Sodium: 134 mmol/L — ABNORMAL LOW (ref 135–145)
Total Bilirubin: 2.5 mg/dL — ABNORMAL HIGH (ref 0.0–1.2)
Total Protein: 8 g/dL (ref 6.5–8.1)

## 2023-09-07 LAB — CBC WITH DIFFERENTIAL/PLATELET
Abs Immature Granulocytes: 0.04 K/uL (ref 0.00–0.07)
Basophils Absolute: 0 K/uL (ref 0.0–0.1)
Basophils Relative: 0 %
Eosinophils Absolute: 0 K/uL (ref 0.0–0.5)
Eosinophils Relative: 0 %
HCT: 40.2 % (ref 36.0–46.0)
Hemoglobin: 12.8 g/dL (ref 12.0–15.0)
Immature Granulocytes: 0 %
Lymphocytes Relative: 9 %
Lymphs Abs: 0.8 K/uL (ref 0.7–4.0)
MCH: 27.9 pg (ref 26.0–34.0)
MCHC: 31.8 g/dL (ref 30.0–36.0)
MCV: 87.8 fL (ref 80.0–100.0)
Monocytes Absolute: 0.6 K/uL (ref 0.1–1.0)
Monocytes Relative: 6 %
Neutro Abs: 7.6 K/uL (ref 1.7–7.7)
Neutrophils Relative %: 85 %
Platelets: 234 K/uL (ref 150–400)
RBC: 4.58 MIL/uL (ref 3.87–5.11)
RDW: 15.9 % — ABNORMAL HIGH (ref 11.5–15.5)
WBC: 9.1 K/uL (ref 4.0–10.5)
nRBC: 0 % (ref 0.0–0.2)

## 2023-09-07 LAB — IRON AND TIBC
Iron: 62 ug/dL (ref 28–170)
Saturation Ratios: 24 % (ref 10.4–31.8)
TIBC: 258 ug/dL (ref 250–450)
UIBC: 196 ug/dL

## 2023-09-07 LAB — FERRITIN: Ferritin: 122 ng/mL (ref 11–307)

## 2023-09-09 LAB — PROTEIN ELECTROPHORESIS, SERUM
A/G Ratio: 0.5 — ABNORMAL LOW (ref 0.7–1.7)
Albumin ELP: 2.7 g/dL — ABNORMAL LOW (ref 2.9–4.4)
Alpha-1-Globulin: 0.3 g/dL (ref 0.0–0.4)
Alpha-2-Globulin: 0.8 g/dL (ref 0.4–1.0)
Beta Globulin: 1.1 g/dL (ref 0.7–1.3)
Gamma Globulin: 2.9 g/dL — ABNORMAL HIGH (ref 0.4–1.8)
Globulin, Total: 5.1 g/dL — ABNORMAL HIGH (ref 2.2–3.9)
M-Spike, %: 2.1 g/dL — ABNORMAL HIGH
Total Protein ELP: 7.8 g/dL (ref 6.0–8.5)

## 2023-09-09 LAB — KAPPA/LAMBDA LIGHT CHAINS
Kappa free light chain: 661.2 mg/L — ABNORMAL HIGH (ref 3.3–19.4)
Kappa, lambda light chain ratio: 19.62 — ABNORMAL HIGH (ref 0.26–1.65)
Lambda free light chains: 33.7 mg/L — ABNORMAL HIGH (ref 5.7–26.3)

## 2023-09-14 ENCOUNTER — Ambulatory Visit (HOSPITAL_COMMUNITY)
Admission: RE | Admit: 2023-09-14 | Discharge: 2023-09-14 | Disposition: A | Discharge status: Home / Self Care | Source: Ambulatory Visit | Attending: Oncology | Admitting: Oncology

## 2023-09-14 ENCOUNTER — Inpatient Hospital Stay (HOSPITAL_BASED_OUTPATIENT_CLINIC_OR_DEPARTMENT_OTHER): Admitting: Oncology

## 2023-09-14 VITALS — BP 135/82 | HR 91 | Temp 98.2°F | Resp 18 | Wt 251.3 lb

## 2023-09-14 DIAGNOSIS — C8300 Small cell B-cell lymphoma, unspecified site: Secondary | ICD-10-CM | POA: Diagnosis not present

## 2023-09-14 DIAGNOSIS — R17 Unspecified jaundice: Secondary | ICD-10-CM | POA: Diagnosis not present

## 2023-09-14 NOTE — Progress Notes (Signed)
 Stephanie [name] [address] [place] [address]    Clinic Day:  09/14/2023  Referring physician: Vick Lurie, FNP  Patient Care Team: Vick Lurie, FNP as PCP - General (Family Medicine) [name] [name], MD as PCP - Cardiology (Cardiology) Shaaron Lamar HERO, MD as Consulting Physician (Gastroenterology) Luke Agent, MD (Inactive) (Internal Medicine) Therisa Benton PARAS, Stephanie [name] as Nurse Practitioner (Nurse Practitioner) Darlean Ozell NOVAK, MD as Consulting Physician (Pulmonary Disease)  Interval history: Patient was last seen in clinic approximately 4 months ago.  She reports appetite of 75% energy levels of 25%.  States she has been battling swelling in her legs since May.  She is currently on Demadex  60 mg.  Reports sores that have been popping up on her legs as well.  Reports typically they go away on their own.  States overall, she just feels very tired and weak.  Reports she fell trying to get into bed the other day.  No injury that she is aware of.  She is wondering if she may be iron  deficient.  She denies any bright red blood per rectum, melena or hematochezia.  She does endorse some ice pica.  ASSESSMENT & PLAN:   Assessment: 1.  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 - History of nephrotic range proteinuria, resolved now. - Skeletal survey (03/10/2022): Possible mottled lucency of the anterior aspect of the L1 vertebral body and mildly increased lucency of the inferior endplate of L2, requires additional dedicated cross-sectional imaging for better characterization CT lumbar spine (04/07/2022): Lytic lesion along superior endplate of L2 vertebral body and may be degenerative in etiology.  No definite suspicious lytic or blastic osseous lesions. MRI lumbar spine (05/08/2022) was negative for evidence of multiple myeloma, but showed  multilevel Schmorl's nodes felt to account for lucency within the L2 superior endplate seen on CT, as well as mildly progressive multilevel spondylosis. - Most recent myeloma panel (07/09/2022) continues to show labs trending upwards: M-spike trending upwards at 2.0 (significantly elevated from M spike around 1.3 one year ago) Immunofixation PENDING Free light chains (last checked 03/10/2022) remain elevated but relatively stable with elevated kappa 790.4, normal lambda 19.0, and stable but elevated ratio 41.60. No CRAB features: Calcium  8.6.  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 Normal Hgb 12.5. LDH normal - Bone marrow biopsy (03/31/2022): Hypercellular bone marrow for age with plasmacytosis (plasma cells 13% of all cells).  Minor B-cell population with kappa light chain excess.  Per pathology report, unclear whether findings represent lymphoid component of a neoplastic lymphoplasmacytic disorder or an independent disease process such as monoclonal B-cell lymphocytosis. Molecular pathology was negative for any abnormal mutations Cytogenetics showed normal female karyotype (46,XX[20]) MYD88 mutation detected [c.794T>C (p.L265P)] - this mutation favors diagnosis of lymphoplasmacytic lymphoma, and may indicate higher risk of disease progression and greater disease burden     2.  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 - She is on Eliquis .   3.  Leukocytosis: - Intermittent leukocytosis since at least 2012 - Denies any chronic steroid use. - She is a non-smoker - BCR-ABL FISH is negative.  JAK2 with reflex is negative   4.  Microcytic anemia, RESOLVED - Previously known to be iron  deficient. - Last received IV iron  on 06/25/2017 and 07/16/2017.   5. CKD stage IIIa - Renal  biopsy (08/29/2021) consistent with kidney disease from diabetes and hypertension - Baseline creatinine 1.1-1.4    Plan: 1.  IgG kappa  smoldering myeloma/lymphoplasmacytic lymphoma: - CT CAP (09/21/2022): No adenopathy. - BMBX (03/31/2022): Hypercellular marrow with plasma cells 13% displaying kappa light chain restriction.  Significant lymphoid aggregate/lymphocyte not seen.  Flow cytometry shows minor B-cell population representing 3% with kappa light chain excess.  Lymphoid findings are atypical and similar to those from 2017.  It is not clear if it is a competent of lymphoplasmacytic lymphoma or smoldering myeloma with monoclonal B-cell lymphocytosis.  Multiple myeloma FISH panel was normal.  Cytogenetics was normal. -MYD88 was positive favoring lymphoplasmacytic lymphoma. - Denies fevers, night sweats or weight loss. - Labs from 09/07/2023: Creatinine 1.29.  Calcium  8.5.  M spike is 2.1.  FLC ratio is 19.62, down from 26.55. - RTC 4 months for follow-up with repeat labs. .   2.  Bilateral DVT: - Continue Eliquis  twice daily.  No bleeding issues.   3.  Normocytic anemia: - Denies bleeding per rectum or melena. - She received 1 g INFeD  on 05/03/2023. - She continues to deny any bleeding but is still having some extreme fatigue. -Repeat labs from 09/07/2023 show iron  saturations 24% with a ferritin of 122.  Hemoglobin is 12.8. -Will hold off on additional iron  at this time.  4.  Elevated bilirubin: -She is complaining of left upper quadrant abdominal pain. -She had an ultrasound of her abdomen on 05/12/2023 which showed increased echotexture of the liver.  This is nonspecific and finding but can be seen in fatty infiltration of the liver. -Lab work from 09/07/2023 show an elevated total bili at 2.5.  Alkaline phosphatase is elevated at 264.  -Unclear etiology but would recommend repeat ultrasound.  Will get this done ASAP.  I will call with results.    I spent 40 minutes dedicated to the care of this patient (face-to-face and non-face-to-face) on the date of the encounter to include what is described in the assessment and  plan.   Orders Placed This Encounter  Procedures   US  Abdomen Complete    Standing Status:   Future    Expected Date:   09/14/2023    Expiration Date:   09/13/2024    Reason for exam::   elevated Billirubin    Preferred imaging location?:   [name]    Delon FORBES Hope, Stephanie [name]   7/29/202512:29 PM  CHIEF COMPLAINT:   Diagnosis: IgG kappa smoldering myeloma/lymphoplasmacytic lymphoma and history of DVT    Cancer Staging  No matching staging information was found for the patient.    Prior Therapy: none  Current Therapy:  surveillance; Eliquis      LABS:   CBC     Component Value Date/Time   WBC 9.1 09/07/2023 1041   RBC 4.58 09/07/2023 1041   HGB 12.8 09/07/2023 1041   HCT 40.2 09/07/2023 1041   PLT 234 09/07/2023 1041   MCV 87.8 09/07/2023 1041   MCH 27.9 09/07/2023 1041   MCHC 31.8 09/07/2023 1041   RDW 15.9 (H) 09/07/2023 1041   LYMPHSABS 0.8 09/07/2023 1041   MONOABS 0.6 09/07/2023 1041   EOSABS 0.0 09/07/2023 1041   BASOSABS 0.0 09/07/2023 1041    CMP      Component Value Date/Time   NA 134 (L) 09/07/2023 1041   K 3.8 09/07/2023 1041   CL 101 09/07/2023 1041   CO2 22 09/07/2023 1041   GLUCOSE 235 (H) 09/07/2023 1041   BUN  28 (H) 09/07/2023 1041   CREATININE 1.29 (H) 09/07/2023 1041   CREATININE 0.95 02/11/2023 1445   CALCIUM  8.5 (L) 09/07/2023 1041   CALCIUM  8.9 08/04/2017 1032   PROT 8.0 09/07/2023 1041   PROT 8.1 12/16/2015 1625   ALBUMIN 2.5 (L) 09/07/2023 1041   ALBUMIN 3.7 12/16/2015 1625   AST 29 09/07/2023 1041   ALT 20 09/07/2023 1041   ALKPHOS 264 (H) 09/07/2023 1041   BILITOT 2.5 (H) 09/07/2023 1041   BILITOT 0.2 12/16/2015 1625   GFRNONAA 44 (L) 09/07/2023 1041   GFRAA 54 (L) 09/05/2019 1021     No results found for: CEA1, CEA / No results found for: CEA1, CEA No results found for: PSA1 No results found for: CAN199 No results found for: RJW874  Lab Results  Component Value Date   TOTALPROTELP 7.8  09/07/2023   ALBUMINELP 2.7 (L) 09/07/2023   A1GS 0.3 09/07/2023   A2GS 0.8 09/07/2023   BETS 1.1 09/07/2023   GAMS 2.9 (H) 09/07/2023   MSPIKE 2.1 (H) 09/07/2023   SPEI Comment 09/07/2023   Lab Results  Component Value Date   TIBC 258 09/07/2023   TIBC 361 04/15/2023   TIBC 294 03/24/2023   FERRITIN 122 09/07/2023   FERRITIN 28 04/15/2023   FERRITIN 42 03/24/2023   IRONPCTSAT 24 09/07/2023   IRONPCTSAT 13 04/15/2023   IRONPCTSAT 12 03/24/2023   Lab Results  Component Value Date   LDH 207 (H) 04/15/2023   LDH 195 (H) 03/04/2023   LDH 139 10/26/2022     STUDIES:   No results found.

## 2023-09-15 ENCOUNTER — Ambulatory Visit: Payer: Self-pay | Admitting: Oncology

## 2023-09-15 NOTE — Progress Notes (Signed)
 Message left for call back

## 2023-09-15 NOTE — Progress Notes (Signed)
 Patient made aware of US  results and of follow up appointment for repeat labs.  Verbalized understanding.

## 2023-09-15 NOTE — Progress Notes (Signed)
 Hey Tomi, would you mind letting this patient know that her abdominal ultrasound did not reveal any abnormalities that would explain why her bilirubin is elevated.  Edsel, would you mind scheduling her for repeat labs in a couple of weeks so we can recheck to see if this has resolved.  Delon Hope, NP 09/15/2023 1:12 PM

## 2023-09-18 ENCOUNTER — Other Ambulatory Visit: Payer: Self-pay | Admitting: Nurse Practitioner

## 2023-09-22 ENCOUNTER — Telehealth: Payer: Self-pay | Admitting: *Deleted

## 2023-09-22 NOTE — Telephone Encounter (Signed)
 Patient called on 09/21/2023, left a message that she was in need of us  sending in a prescription for a new meter, test strips, and lancets. She also ask about her powder that she uses. This prescription was sent in on 09/20/2023. I ask that the patient reach out to her pharmacy or her insurance and see which meter and supplies that her insurance will cover, let us  know and we will be happy to send that in for her.

## 2023-09-23 ENCOUNTER — Other Ambulatory Visit: Payer: Self-pay | Admitting: *Deleted

## 2023-09-23 DIAGNOSIS — Z7984 Long term (current) use of oral hypoglycemic drugs: Secondary | ICD-10-CM

## 2023-09-23 DIAGNOSIS — Z794 Long term (current) use of insulin: Secondary | ICD-10-CM

## 2023-09-23 DIAGNOSIS — E1122 Type 2 diabetes mellitus with diabetic chronic kidney disease: Secondary | ICD-10-CM

## 2023-09-23 DIAGNOSIS — N1832 Chronic kidney disease, stage 3b: Secondary | ICD-10-CM

## 2023-09-23 MED ORDER — ACCU-CHEK GUIDE TEST VI STRP: ORAL_STRIP | 5 refills | Status: AC

## 2023-09-23 MED ORDER — ACCU-CHEK FASTCLIX LANCETS MISC: 5 refills | Status: AC

## 2023-09-23 MED ORDER — ACCU-CHEK GUIDE ME W/DEVICE KIT: PACK | 0 refills | Status: DC

## 2023-09-29 ENCOUNTER — Other Ambulatory Visit

## 2023-09-29 ENCOUNTER — Other Ambulatory Visit: Payer: Self-pay

## 2023-09-29 DIAGNOSIS — D472 Monoclonal gammopathy: Secondary | ICD-10-CM

## 2023-09-29 DIAGNOSIS — D649 Anemia, unspecified: Secondary | ICD-10-CM

## 2023-09-29 NOTE — Progress Notes (Signed)
 Lab orders placed.

## 2023-09-30 ENCOUNTER — Inpatient Hospital Stay: Attending: Oncology

## 2023-09-30 ENCOUNTER — Other Ambulatory Visit

## 2023-09-30 DIAGNOSIS — D649 Anemia, unspecified: Secondary | ICD-10-CM | POA: Insufficient documentation

## 2023-09-30 DIAGNOSIS — C8300 Small cell B-cell lymphoma, unspecified site: Secondary | ICD-10-CM | POA: Insufficient documentation

## 2023-09-30 DIAGNOSIS — D472 Monoclonal gammopathy: Secondary | ICD-10-CM | POA: Diagnosis present

## 2023-09-30 LAB — CBC WITH DIFFERENTIAL/PLATELET
Abs Immature Granulocytes: 0.03 K/uL (ref 0.00–0.07)
Basophils Absolute: 0 K/uL (ref 0.0–0.1)
Basophils Relative: 1 %
Eosinophils Absolute: 0.2 K/uL (ref 0.0–0.5)
Eosinophils Relative: 3 %
HCT: 38.6 % (ref 36.0–46.0)
Hemoglobin: 12.2 g/dL (ref 12.0–15.0)
Immature Granulocytes: 0 %
Lymphocytes Relative: 22 %
Lymphs Abs: 1.5 K/uL (ref 0.7–4.0)
MCH: 27.6 pg (ref 26.0–34.0)
MCHC: 31.6 g/dL (ref 30.0–36.0)
MCV: 87.3 fL (ref 80.0–100.0)
Monocytes Absolute: 0.8 K/uL (ref 0.1–1.0)
Monocytes Relative: 12 %
Neutro Abs: 4.2 K/uL (ref 1.7–7.7)
Neutrophils Relative %: 62 %
Platelets: 199 K/uL (ref 150–400)
RBC: 4.42 MIL/uL (ref 3.87–5.11)
RDW: 14.9 % (ref 11.5–15.5)
WBC: 6.9 K/uL (ref 4.0–10.5)
nRBC: 0 % (ref 0.0–0.2)

## 2023-09-30 LAB — COMPREHENSIVE METABOLIC PANEL WITH GFR
ALT: 16 U/L (ref 0–44)
AST: 22 U/L (ref 15–41)
Albumin: 2.5 g/dL — ABNORMAL LOW (ref 3.5–5.0)
Alkaline Phosphatase: 233 U/L — ABNORMAL HIGH (ref 38–126)
Anion gap: 9 (ref 5–15)
BUN: 23 mg/dL (ref 8–23)
CO2: 25 mmol/L (ref 22–32)
Calcium: 8.4 mg/dL — ABNORMAL LOW (ref 8.9–10.3)
Chloride: 97 mmol/L — ABNORMAL LOW (ref 98–111)
Creatinine, Ser: 1.25 mg/dL — ABNORMAL HIGH (ref 0.44–1.00)
GFR, Estimated: 46 mL/min — ABNORMAL LOW (ref >60)
Glucose, Bld: 149 mg/dL — ABNORMAL HIGH (ref 70–99)
Potassium: 3.6 mmol/L (ref 3.5–5.1)
Sodium: 131 mmol/L — ABNORMAL LOW (ref 135–145)
Total Bilirubin: 2.3 mg/dL — ABNORMAL HIGH (ref 0.0–1.2)
Total Protein: 7.7 g/dL (ref 6.5–8.1)

## 2023-09-30 LAB — IRON AND TIBC
Iron: 60 ug/dL (ref 28–170)
Saturation Ratios: 23 % (ref 10.4–31.8)
TIBC: 256 ug/dL (ref 250–450)
UIBC: 196 ug/dL

## 2023-09-30 LAB — FERRITIN: Ferritin: 101 ng/mL (ref 11–307)

## 2023-10-01 LAB — KAPPA/LAMBDA LIGHT CHAINS
Kappa free light chain: 580.3 mg/L — ABNORMAL HIGH (ref 3.3–19.4)
Kappa, lambda light chain ratio: 18.84 — ABNORMAL HIGH (ref 0.26–1.65)
Lambda free light chains: 30.8 mg/L — ABNORMAL HIGH (ref 5.7–26.3)

## 2023-10-04 LAB — PROTEIN ELECTROPHORESIS, SERUM
A/G Ratio: 0.5 — ABNORMAL LOW (ref 0.7–1.7)
Albumin ELP: 2.6 g/dL — ABNORMAL LOW (ref 2.9–4.4)
Alpha-1-Globulin: 0.3 g/dL (ref 0.0–0.4)
Alpha-2-Globulin: 0.7 g/dL (ref 0.4–1.0)
Beta Globulin: 1 g/dL (ref 0.7–1.3)
Gamma Globulin: 2.8 g/dL — ABNORMAL HIGH (ref 0.4–1.8)
Globulin, Total: 4.8 g/dL — ABNORMAL HIGH (ref 2.2–3.9)
M-Spike, %: 2.2 g/dL — ABNORMAL HIGH
Total Protein ELP: 7.4 g/dL (ref 6.0–8.5)

## 2023-10-05 ENCOUNTER — Ambulatory Visit: Payer: Self-pay | Admitting: Oncology

## 2023-10-05 NOTE — Progress Notes (Unsigned)
 SABRA

## 2023-10-06 ENCOUNTER — Encounter: Payer: Self-pay | Admitting: Adult Health

## 2023-10-06 ENCOUNTER — Ambulatory Visit (INDEPENDENT_AMBULATORY_CARE_PROVIDER_SITE_OTHER): Payer: 59 | Admitting: Adult Health

## 2023-10-06 VITALS — BP 130/90 | HR 60 | Ht 68.0 in | Wt 242.8 lb

## 2023-10-06 DIAGNOSIS — I639 Cerebral infarction, unspecified: Secondary | ICD-10-CM | POA: Diagnosis not present

## 2023-10-06 DIAGNOSIS — G4733 Obstructive sleep apnea (adult) (pediatric): Secondary | ICD-10-CM | POA: Diagnosis not present

## 2023-10-06 NOTE — Patient Instructions (Signed)
 Your Plan:  Restart nightly use of CPAP with ensuring greater than 4 hours per night for optimal benefit  Continue to follow with [facility] for any needed supplies - if you continue to have difficulty with them, please let me know and we can look at getting you set up with a different DME company   Continue to follow with your PCP for stroke risk factor management      Follow up in 1 year or call earlier if needed     Thank you for coming to see us  at [facility]. I hope we have been able to provide you high quality care today.  You may receive a patient satisfaction survey over the next few weeks. We would appreciate your feedback and comments so that we may continue to improve ourselves and the health of our patients.

## 2023-10-12 ENCOUNTER — Telehealth: Payer: Self-pay | Admitting: Adult Health

## 2023-10-12 NOTE — Telephone Encounter (Signed)
 [facility] left a voicemail on our phone stating they received an order for the patient for a CPAP. They stated they are not in network with the patient health insurance and to send the order some where else. If you have an questions their phone number is [phone].

## 2023-10-13 NOTE — Telephone Encounter (Signed)
 Received a response from [facility] indicating they would not be able to get her set up. Will attempt transfer of care to [facility].

## 2023-10-14 ENCOUNTER — Ambulatory Visit: Admitting: Nurse Practitioner

## 2023-10-21 ENCOUNTER — Encounter: Payer: Self-pay | Admitting: Nurse Practitioner

## 2023-10-21 ENCOUNTER — Ambulatory Visit (INDEPENDENT_AMBULATORY_CARE_PROVIDER_SITE_OTHER): Admitting: Nurse Practitioner

## 2023-10-21 VITALS — BP 116/82 | HR 60 | Ht 68.0 in | Wt 252.6 lb

## 2023-10-21 DIAGNOSIS — Z7985 Long-term (current) use of injectable non-insulin antidiabetic drugs: Secondary | ICD-10-CM

## 2023-10-21 DIAGNOSIS — Z794 Long term (current) use of insulin: Secondary | ICD-10-CM

## 2023-10-21 DIAGNOSIS — Z7984 Long term (current) use of oral hypoglycemic drugs: Secondary | ICD-10-CM | POA: Diagnosis not present

## 2023-10-21 DIAGNOSIS — E1122 Type 2 diabetes mellitus with diabetic chronic kidney disease: Secondary | ICD-10-CM | POA: Diagnosis not present

## 2023-10-21 DIAGNOSIS — L304 Erythema intertrigo: Secondary | ICD-10-CM

## 2023-10-21 DIAGNOSIS — N1831 Chronic kidney disease, stage 3a: Secondary | ICD-10-CM | POA: Diagnosis not present

## 2023-10-21 DIAGNOSIS — E782 Mixed hyperlipidemia: Secondary | ICD-10-CM

## 2023-10-21 DIAGNOSIS — E039 Hypothyroidism, unspecified: Secondary | ICD-10-CM

## 2023-10-21 DIAGNOSIS — I10 Essential (primary) hypertension: Secondary | ICD-10-CM

## 2023-10-21 MED ORDER — LEVOTHYROXINE SODIUM 100 MCG PO TABS: 100.0000 ug | ORAL_TABLET | Freq: Every day | ORAL | 1 refills | Status: DC

## 2023-10-21 MED ORDER — INTERDRY 10"X144" EX SHEE
MEDICATED_PATCH | CUTANEOUS | 0 refills | Status: AC
Start: 2023-10-21 — End: ?

## 2023-10-21 NOTE — Progress Notes (Signed)
 Endocrinology Follow Up Note       10/21/2023, 2:27 PM   Subjective:    [name], female    DOB: [birth year].  Stephanie [name] is being seen in follow up after being seen in consultation for management of currently uncontrolled symptomatic diabetes requested by  Vick Lurie, FNP.   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   Type 2 diabetes mellitus (HCC)     Past Surgical History:  Procedure Laterality Date   ABDOMINAL HYSTERECTOMY  partial   CARPAL TUNNEL RELEASE Right 1991   CATARACT EXTRACTION W/PHACO Right 05/08/2013   Procedure: CATARACT EXTRACTION PHACO AND INTRAOCULAR LENS PLACEMENT (IOC);  Surgeon: Cherene Mania, MD;  Location: [facility];  Service: Ophthalmology;  Laterality: Right;  CDE 10.31   CATARACT EXTRACTION W/PHACO Left 08/17/2013   Procedure: CATARACT EXTRACTION PHACO AND INTRAOCULAR LENS PLACEMENT (IOC);  Surgeon: Cherene Mania, MD;  Location: [facility];  Service: Ophthalmology;  Laterality: Left;  CDE:9.03   CHOLECYSTECTOMY  1971   COLONOSCOPY WITH PROPOFOL  N/A 01/06/2016   Dr. Shaaron: diverticulosis    DENTAL SURGERY     ESOPHAGEAL BRUSHING  08/29/2019   Procedure: ESOPHAGEAL BRUSHING;  Surgeon: Shaaron Lamar HERO, MD;  Location: [facility];  Service: Endoscopy;;   ESOPHAGOGASTRODUODENOSCOPY (EGD) WITH PROPOFOL  N/A 01/06/2016   Dr. Shaaron: normal s/p empiric dilation    ESOPHAGOGASTRODUODENOSCOPY (EGD) WITH PROPOFOL  N/A  08/29/2019   esophageal plaques vs medication residue adherent to tubular esophagus s/p KOH brushing and dilation. Medium-sized hiatal hernia. + for candida. Diflucan .    EYE SURGERY     IR BONE MARROW BIOPSY & ASPIRATION  03/31/2022   MALONEY DILATION N/A 01/06/2016   Procedure: AGAPITO DILATION;  Surgeon: Lamar HERO Shaaron, MD;  Location: [facility];  Service: Endoscopy;  Laterality: N/A;   MALONEY DILATION N/A 08/29/2019   Procedure: AGAPITO DILATION;  Surgeon: Shaaron Lamar HERO, MD;  Location: [facility];  Service: Endoscopy;  Laterality: N/A;   REVERSE SHOULDER ARTHROPLASTY Right 08/06/2017   Procedure: RIGHT REVERSE SHOULDER ARTHROPLASTY;  Surgeon: Kay Kemps, MD;  Location: [facility] OR;  Service: Orthopedics;  Laterality: Right;   WRIST GANGLION EXCISION Left     Social History   Socioeconomic History   Marital status: Divorced    Spouse name: Not on file   Number of children: Not on file   Years of education: Not on file   Highest education level: Not on file  Occupational History   Not on file  Tobacco Use   Smoking status: Former    Current packs/day: 0.00    Average packs/day: 0.3 packs/day for 30.0  years (7.5 ttl pk-yrs)    Types: Cigarettes    Start date: 02/16/1981    Quit date: 02/17/2011    Years since quitting: 12.6   Smokeless tobacco: Never  Vaping Use   Vaping status: Never Used  Substance and Sexual Activity   Alcohol use: Not Currently    Comment: rare   Drug use: No   Sexual activity: Not Currently    Birth control/protection: Surgical  Other Topics Concern   Not on file  Social History Narrative   Pt lives with daughter    Retired    Social Drivers of Corporate investment banker Strain: Not on file  Food Insecurity: Low Risk  (04/30/2023)   Received from [facility]   Hunger Vital Sign    Within the past 12 months, you worried that your food would run out before you got money to buy more: Never true    Within the past 12 months, the food you bought  just didn't last and you didn't have money to get more. : Never true  Transportation Needs: No Transportation Needs (04/30/2023)   Received from [facility]    In the past 12 months, has lack of reliable transportation kept you from medical appointments, meetings, work or from getting things needed for daily living? : No  Physical Activity: Not on file  Stress: Not on file  Social Connections: Not on file    Family History  Problem Relation Age of Onset   Hypertension Mother    Diabetes Mother    COPD Mother    Arthritis Mother    Diabetes Father    Arthritis Father    Dementia Father    CAD Father    Hypothyroidism Sister    Diabetes Brother    Stroke Paternal Aunt    Colon cancer Niece    Colon polyps Neg Hx    Sleep apnea Neg Hx     Outpatient Encounter Medications as of 10/21/2023  Medication Sig   Accu-Chek FastClix Lancets MISC Use 1 lancet to check blood glucose four (4) times daily as directed by provider.   ACCU-CHEK GUIDE test strip 4 (four) times daily.   albuterol  (PROVENTIL ) (2.5 MG/3ML) 0.083% nebulizer solution Take 2.5 mg by nebulization every 6 (six) hours as needed for wheezing or shortness of breath.   albuterol  (VENTOLIN  HFA) 108 (90 Base) MCG/ACT inhaler Inhale 1-2 puffs into the lungs every 6 (six) hours as needed for wheezing or shortness of breath.   Alcohol Swabs (ALCOHOL PADS) 70 % PADS SMARTSIG:Pledget(s) Topical 4 Times Daily   apixaban  (ELIQUIS ) 5 MG TABS tablet Take 1 tablet (5 mg total) by mouth 2 (two) times daily.   ARNUITY ELLIPTA  100 MCG/ACT AEPB Inhale 1 puff into the lungs daily.   Ascorbic Acid  (VITAMIN C ) 1000 MG tablet Take 500 mg by mouth daily.   atorvastatin  (LIPITOR ) 80 MG tablet Take 1 tablet (80 mg total) by mouth daily.   bisoprolol  (ZEBETA ) 10 MG tablet TAKE 1 TABLET(10 MG) BY MOUTH IN THE MORNING AND AT BEDTIME   Blood Glucose Calibration (ACCU-CHEK GUIDE CONTROL) LIQD See admin instructions.   Blood Glucose  Monitoring Suppl (ACCU-CHEK GUIDE ME) w/Device KIT Use Glucose Meter to monitor blood glucose readings four(4) times daily as directed by provider.   Cetirizine  HCl 10 MG CAPS Take 10 mg by mouth daily.   Cholecalciferol  (VITAMIN D ) 2000 units tablet Take 4,000 Units by mouth daily.  (Patient taking differently: Take 2,000  Units by mouth daily.)   cimetidine  (TAGAMET ) 200 MG tablet Take 0.5 tablets (100 mg total) by mouth daily as needed.   CINNAMON PO Take 1-2 capsules by mouth 3 (three) times daily.   clobetasol  ointment (TEMOVATE ) 0.05 % Apply 1 application  topically as needed (imflammation).   Continuous Glucose Receiver (FREESTYLE LIBRE 3 READER) DEVI USE TO MONITOR GLUCOSE CONTINUOUSLY AS DIRECTED   Continuous Glucose Sensor (FREESTYLE LIBRE 3 PLUS SENSOR) MISC USE TO MONITOR GLUCOSE CONTINUOUSLY AS DIRECTED. CHANGE SENSOR EVERY 15 DAYS.   DULoxetine  (CYMBALTA ) 60 MG capsule Take 60 mg by mouth 2 (two) times daily.   EASY COMFORT PEN NEEDLES 31G X 5 MM MISC INJECT IN SULIN EVERY DAY AS DIRECTED   Evolocumab  (REPATHA  SURECLICK) 140 MG/ML SOAJ Inject 140 mg into the skin every 14 (fourteen) days.   fluticasone  (FLONASE ) 50 MCG/ACT nasal spray SMARTSIG:2 Spray(s) Both Nares Daily PRN   glucose blood (ACCU-CHEK GUIDE TEST) test strip Use 1 test strip four(4) times daily to monitor blood glucose as directed by provider.   guaiFENesin  (ROBITUSSIN) 100 MG/5ML SOLN Take 5 mLs (100 mg total) by mouth every 4 (four) hours as needed for cough or to loosen phlegm. (Patient taking differently: Take 5 mLs by mouth as needed for cough or to loosen phlegm.)   hydrOXYzine  (ATARAX /VISTARIL ) 25 MG tablet Take 25 mg by mouth every 8 (eight) hours as needed for itching.    insulin  degludec (TRESIBA  FLEXTOUCH) 100 UNIT/ML FlexTouch Pen Inject 15 Units into the skin at bedtime.   Ipratropium-Albuterol  (COMBIVENT  RESPIMAT IN) Inhale 2 puffs into the lungs. (Patient taking differently: Inhale 2 puffs into the lungs  daily as needed.)   KLAYESTA  powder APPLY TOPICALLY THREE TIMES DAILY   Lancet Devices (EASY MINI EJECT LANCING DEVICE) MISC 4 (four) times daily.   lidocaine  (LIDODERM ) 5 % Place 1 patch onto the skin daily. Remove & Discard patch within 12 hours or as directed by MD (Patient taking differently: Place 1 patch onto the skin daily as needed. Remove & Discard patch within 12 hours or as directed by MD)   lubiprostone  (AMITIZA ) 24 MCG capsule Take 1 capsule (24 mcg total) by mouth 2 (two) times daily with a meal. (Patient taking differently: Take 24 mcg by mouth 2 (two) times daily as needed for constipation.)   LYRICA  75 MG capsule Take 75 mg by mouth 2 (two) times daily.    methocarbamol  (ROBAXIN ) 500 MG tablet Take 1 tablet (500 mg total) by mouth 3 (three) times daily as needed. (Patient taking differently: Take 500 mg by mouth 3 (three) times daily as needed for muscle spasms.)   montelukast  (SINGULAIR ) 10 MG tablet Take 10 mg by mouth at bedtime.   pantoprazole  (PROTONIX ) 40 MG tablet Take 1 tablet (40 mg total) by mouth daily before breakfast.   RESTASIS 0.05 % ophthalmic emulsion Place 1 drop into both eyes 2 (two) times daily.   sacubitril-valsartan (ENTRESTO ) 24-26 MG Take 1 tablet by mouth 2 (two) times daily.   Simethicone 125 MG CAPS Take 1 capsule every day by oral route as needed.   Skin Protectants, Misc. (INTERDRY 10X144) SHEE Change pad daily or if soiled   spironolactone (ALDACTONE) 25 MG tablet Take 25 mg by mouth daily.   Tiotropium Bromide-Olodaterol (STIOLTO RESPIMAT ) 2.5-2.5 MCG/ACT AERS Inhale 2 puffs into the lungs daily.   tirzepatide  (MOUNJARO ) 12.5 MG/0.5ML Pen Inject 12.5 mg into the skin once a week.   torsemide  (DEMADEX ) 20 MG tablet Take 2 tablets (  40 mg total) by mouth daily. (Patient taking differently: Take 80 mg by mouth daily.)   traMADol  (ULTRAM ) 50 MG tablet Take 50 mg by mouth 3 (three) times daily as needed for moderate pain.   traZODone  (DESYREL ) 150 MG  tablet Take 150 mg by mouth at bedtime.   ursodiol (ACTIGALL) 300 MG capsule Take 300 mg by mouth 2 (two) times daily.   vitamin B-12 (CYANOCOBALAMIN ) 1000 MCG tablet Take 1,000 mcg by mouth daily.   [DISCONTINUED] HUMALOG  KWIKPEN 100 UNIT/ML KwikPen Inject 2-8 Units into the skin 3 (three) times daily.   [DISCONTINUED] levothyroxine  (SYNTHROID ) 100 MCG tablet Take 1 tablet (100 mcg total) by mouth daily before breakfast.   levothyroxine  (SYNTHROID ) 100 MCG tablet Take 1 tablet (100 mcg total) by mouth daily before breakfast.   No facility-administered encounter medications on file as of 10/21/2023.    ALLERGIES: Allergies  Allergen Reactions   Tetracyclines & Related Anaphylaxis and Rash   Banana Hives and Nausea And Vomiting   Penicillins Rash and Other (See Comments)    VACCINATION STATUS: Immunization History  Administered Date(s) Administered   Fluad Quad(high Dose 65+) 11/08/2018, 12/12/2020   Influenza Inj Mdck Quad Pf 01/04/2017, 12/14/2019, 12/03/2020   Influenza Inj Mdck Quad With Preservative 01/04/2017   Influenza,inj,Quad PF,6+ Mos 12/24/2015, 11/15/2017   Influenza-Unspecified 12/02/2016, 12/03/2020   Pneumococcal Polysaccharide-23 10/22/2013    Diabetes She presents for her follow-up diabetic visit. She has type 2 diabetes mellitus. Onset time: Diagnosed at approx age of 51. Her disease course has been improving. There are no hypoglycemic associated symptoms. Associated symptoms include fatigue and polyuria (on Jardiance ). Pertinent negatives for diabetes include no polydipsia. There are no hypoglycemic complications. Symptoms are stable. Diabetic complications include a CVA, heart disease and nephropathy. Risk factors for coronary artery disease include diabetes mellitus, dyslipidemia, family history, hypertension, post-menopausal and sedentary lifestyle. Current diabetic treatment includes intensive insulin  program (and Mounjaro ). She is compliant with treatment most of  the time. Her weight is fluctuating minimally. She is following a generally healthy diet. When asked about meal planning, she reported none. She has not had a previous visit with a dietitian. She rarely participates in exercise. Her home blood glucose trend is decreasing steadily. Her overall blood glucose range is 140-180 mg/dl. (She presents today with her CGM and meter showing mostly at goal glycemic profile overall.  Her POCT A1c today is 7.2%, improving from last visit of 8%.  Analysis of her CGM shows TIR 65%, TAR 35%, TBR 0% with a GMI of 7.4%.  She notes she has not had much of an appetite recently.  She has also been swelling much more.  Has follow up with nephrology tomorrow to discuss.) An ACE inhibitor/angiotensin II receptor blocker is not being taken. She sees a podiatrist.Eye exam is current.     Review of systems  Constitutional: + fluctuating body weight, current Body mass index is 38.41 kg/m., no fatigue, no subjective hyperthermia, no subjective hypothermia Eyes: no blurry vision, no xerophthalmia ENT: no sore throat, no nodules palpated in throat, no dysphagia/odynophagia, no hoarseness Cardiovascular: no chest pain, no shortness of breath, no palpitations, + leg swelling-extending up to abdomen Respiratory: no cough, + shortness of breath, on O2 via [place] Gastrointestinal: no nausea/vomiting/diarrhea Musculoskeletal: no muscle/joint aches, WC bound Skin: no rashes, no hyperemia Neurological: no tremors, no numbness, no tingling, no dizziness Psychiatric: no depression, no anxiety  Objective:     BP 116/82 (BP Location: Left Arm, Patient Position: Sitting, Cuff Size:  Large)   Pulse 60   Ht 5' 8 (1.727 m)   Wt 252 lb 9.6 oz (114.6 kg)   BMI 38.41 kg/m   Wt Readings from Last 3 Encounters:  10/21/23 252 lb 9.6 oz (114.6 kg)  10/06/23 242 lb 12.8 oz (110.1 kg)  09/14/23 251 lb 5.2 oz (114 kg)     BP Readings from Last 3 Encounters:  10/21/23 116/82  10/06/23 (!)  130/90  09/14/23 135/82     Physical Exam- Limited  Constitutional:  Body mass index is 38.41 kg/m. , not in acute distress, normal state of mind, ++ pitting edema to BLE extending up to abdomen Eyes:  EOMI, no exophthalmos Musculoskeletal: no gross deformities, strength intact in all four extremities, no gross restriction of joint movements, in WC today  Skin:  no rashes, no hyperemia, + intertrigo dermatitis to abdominal folds- not responding to nystatin  powder Neurological: no tremor with outstretched hands   Diabetic Foot Exam - Simple   No data filed      CMP ( most recent) CMP     Component Value Date/Time   NA 131 (L) 09/30/2023 0910   K 3.6 09/30/2023 0910   CL 97 (L) 09/30/2023 0910   CO2 25 09/30/2023 0910   GLUCOSE 149 (H) 09/30/2023 0910   BUN 23 09/30/2023 0910   CREATININE 1.25 (H) 09/30/2023 0910   CREATININE 0.95 02/11/2023 1445   CALCIUM  8.4 (L) 09/30/2023 0910   CALCIUM  8.9 08/04/2017 1032   PROT 7.7 09/30/2023 0910   PROT 8.1 12/16/2015 1625   ALBUMIN 2.5 (L) 09/30/2023 0910   ALBUMIN 3.7 12/16/2015 1625   AST 22 09/30/2023 0910   ALT 16 09/30/2023 0910   ALKPHOS 233 (H) 09/30/2023 0910   BILITOT 2.3 (H) 09/30/2023 0910   BILITOT 0.2 12/16/2015 1625   GFRNONAA 46 (L) 09/30/2023 0910   GFRAA 54 (L) 09/05/2019 1021     Diabetic Labs (most recent): Lab Results  Component Value Date   HGBA1C 8.0 (H) 06/04/2023   HGBA1C 8.7 (A) 09/29/2022   HGBA1C 8.2 (A) 06/23/2022   MICROALBUR 5,373.1 (H) 04/03/2021   MICROALBUR 598.9 (H) 12/04/2019     Lipid Panel ( most recent) Lipid Panel     Component Value Date/Time   CHOL 201 (H) 06/04/2023 1600   CHOL 186 03/16/2023 1015   TRIG 93 06/04/2023 1600   HDL 60 06/04/2023 1600   HDL 60 03/16/2023 1015   CHOLHDL 3.4 06/04/2023 1600   VLDL 19 06/04/2023 1600   LDLCALC 122 (H) 06/04/2023 1600   LDLCALC 109 (H) 03/16/2023 1015   LABVLDL 17 03/16/2023 1015      Lab Results  Component Value Date    TSH 2.62 06/10/2023   TSH 1.555 12/24/2022   TSH 4.374 09/17/2022   TSH 1.934 01/29/2022   TSH 1.824 10/16/2021   TSH 2.213 07/24/2021   TSH 3.483 04/03/2021   TSH 2.253 11/28/2020   TSH 0.660 09/30/2020   TSH 0.559 06/28/2020   FREET4 1.3 06/10/2023   FREET4 0.92 12/24/2022   FREET4 0.87 09/17/2022           Assessment & Plan:   1) Type 2 diabetes mellitus with stage 3a chronic kidney disease, with long-term current use of insulin  (HCC)  She presents today with her CGM and meter showing mostly at goal glycemic profile overall.  Her POCT A1c today is 7.2%, improving from last visit of 8%.  Analysis of her CGM shows TIR 65%, TAR 35%,  TBR 0% with a GMI of 7.4%.  She notes she has not had much of an appetite recently.  She has also been swelling much more.  Has follow up with nephrology tomorrow to discuss.  Stephanie [name] has currently uncontrolled symptomatic type 2 DM since 73 years of age.   -Recent labs reviewed.  - I had a long discussion with her about the progressive nature of diabetes and the pathology behind its complications. -her diabetes is complicated by CHF, CKD stage 3, CVA x 5 and she remains at a high risk for more acute and chronic complications which include CAD, CVA, CKD, retinopathy, and neuropathy. These are all discussed in detail with her.  The following Lifestyle Medicine recommendations according to American College of Lifestyle Medicine [facility]) were discussed and offered to patient and she agrees to start the journey:  A. Whole Foods, Plant-based plate comprising of fruits and vegetables, plant-based proteins, whole-grain carbohydrates was discussed in detail with the patient.   A list for source of those nutrients were also provided to the patient.  Patient will use only water or unsweetened tea for hydration. B.  The need to stay away from risky substances including alcohol, smoking; obtaining 7 to 9 hours of restorative sleep, at least 150 minutes of  moderate intensity exercise weekly, the importance of healthy social connections,  and stress reduction techniques were discussed. C.  A full color page of  Calorie density of various food groups per pound showing examples of each food groups was provided to the patient.  - Nutritional counseling repeated at each appointment due to patients tendency to fall back in to old habits.  - The patient admits there is a room for improvement in their diet and drink choices. -  Suggestion is made for the patient to avoid simple carbohydrates from their diet including Cakes, Sweet Desserts / Pastries, Ice Cream, Soda (diet and regular), Sweet Tea, Candies, Chips, Cookies, Sweet Pastries, Store Bought Juices, Alcohol in Excess of 1-2 drinks a day, Artificial Sweeteners, Coffee Creamer, and Sugar-free Products. This will help patient to have stable blood glucose profile and potentially avoid unintended weight gain.   - I encouraged the patient to switch to unprocessed or minimally processed complex starch and increased protein intake (animal or plant source), fruits, and vegetables.   - Patient is advised to stick to a routine mealtimes to eat 3 meals a day and avoid unnecessary snacks (to snack only to correct hypoglycemia).  - I have approached her with the following individualized plan to manage her diabetes and patient agrees:   -She is advised to continue her Tresiba  15 units SQ nightly and stop the Humalog  altogether given her excellent response.  Will continue Mounjaro  12.5 mg SQ weekly.  The ultimate goal would be to further simplify her regimen and get her off insulin  in the future by increasing her GLP1.   She can stay off Jardiance  for now, had really bad yeast infection on this medication.  -she is encouraged to continue monitoring glucose 4 times daily (using her CGM), before meals and before bed, and to call the clinic if she has readings less than 70 or above 300 for 3 tests in a row.  - she is  warned not to take insulin  without proper monitoring per orders. - Adjustment parameters are given to her for hypo and hyperglycemia in writing.  - she is not a candidate for Metformin due to concurrent renal insufficiency.  - Specific targets for  A1c; LDL, HDL, and Triglycerides were discussed with the patient.  2) Blood Pressure /Hypertension:  her blood pressure is controlled to target for her age.   she is advised to continue her current medications including Bisoprolol  10 mg p.o. daily with breakfast.  3) Lipids/Hyperlipidemia:    Review of her recent lipid panel from 01/29/22 showed controlled LDL at 52 .  she is advised to continue Lipitor  80 mg daily at bedtime.  Side effects and precautions discussed with her.  4)  Weight/Diet:  her Body mass index is 38.41 kg/m.  -  clearly complicating her diabetes care.   she is a candidate for weight loss. I discussed with her the fact that loss of 5 - 10% of her  current body weight will have the most impact on her diabetes management.  Exercise, and detailed carbohydrates information provided  -  detailed on discharge instructions.  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    There are no recent TFTs to review.  She is advised to continue her Levothyroxine  100 mcg po daily before breakfast.  Will recheck prior to next visit and adjust dose accordingly.   - The correct intake of thyroid  hormone (Levothyroxine , Synthroid ), is on empty stomach first thing in the morning, with water, separated by at least 30 minutes from breakfast and other medications,  and separated by more than 4 hours from calcium , iron , multivitamins, acid reflux medications (PPIs).  - This medication is a life-long medication and will be needed to correct thyroid  hormone imbalances for the rest of your life.  The dose may change from time to time,  based on thyroid  blood work.  - It is extremely important to be consistent taking this medication, near the same time each morning.  -AVOID TAKING PRODUCTS CONTAINING BIOTIN (commonly found in Hair, Skin, Nails vitamins) AS IT INTERFERES WITH THE VALIDITY OF THYROID  FUNCTION BLOOD TESTS.  6) Chronic Care/Health Maintenance: -she is not on ACEI/ARB and is on Statin medications and is encouraged to initiate and continue to follow up with Ophthalmology, Dentist, Podiatrist at least yearly or according to recommendations, and advised to stay away from smoking. I have recommended yearly flu vaccine and pneumonia vaccine at least every 5 years; moderate intensity exercise for up to 150 minutes weekly; and sleep for at least 7 hours a day.  7) intertrigo dermatitis to abdominal folds Not responding to Nystatin  powder.  Will try sending in for Interdry, change if soiled or use for up to 5 days.  - she is advised to maintain close follow up with [place], Tenika, FNP for primary care needs, as well as her other providers for optimal and coordinated care.    I spent  52  minutes in the care of the patient today including review of labs from CMP, Lipids, Thyroid  Function, Hematology (current and previous including abstractions from other facilities); face-to-face time discussing  her blood glucose readings/logs, discussing hypoglycemia and hyperglycemia episodes and symptoms, medications doses, her options of short and long term treatment based on the latest standards of care / guidelines;  discussion about incorporating lifestyle medicine;  and documenting the encounter. Risk reduction counseling performed per USPSTF guidelines to reduce obesity and cardiovascular risk factors.     Please refer to Patient Instructions for Blood Glucose Monitoring and Insulin /Medications Dosing Guide  in media tab for additional information. Please  also refer  to  Patient Self Inventory in the Media  tab for reviewed  elements of pertinent patient history.  Stephanie [name] participated in the discussions, expressed understanding, and voiced agreement with the above plans.  All questions were answered to her satisfaction. she is encouraged to contact clinic should she have any questions or concerns prior to her return visit.     Follow up plan: - Return in about 4 months (around 02/20/2024) for Diabetes F/U with A1c in office, Thyroid  follow up, Previsit labs, Bring meter and logs.  Benton Rio, [facility] [address] [place], [place] [address] Phone: [phone] Fax: [phone]  10/21/2023, 2:27 PM

## 2023-10-22 ENCOUNTER — Ambulatory Visit: Payer: Self-pay | Admitting: Nurse Practitioner

## 2023-10-22 LAB — T4, FREE: Free T4: 1.8 ng/dL (ref 0.8–1.8)

## 2023-10-22 LAB — TSH: TSH: 4.54 m[IU]/L — ABNORMAL HIGH (ref 0.40–4.50)

## 2023-10-25 NOTE — Progress Notes (Signed)
 Noted, Stephanie [name] has sent the patient a message reviewing her lab work.

## 2023-10-28 ENCOUNTER — Telehealth: Payer: Self-pay | Admitting: Adult Health

## 2023-10-28 NOTE — Telephone Encounter (Signed)
 Sent message to Oneil Patch w/[facility] as we requested TOC to them as of last OV 10/06/23.

## 2023-10-28 NOTE — Telephone Encounter (Signed)
 [place] @[facility]  is asking for Rx for CPAP suppliers, face to face notes and copy of  sleep study, this can be faxed to [phone]

## 2023-11-12 ENCOUNTER — Ambulatory Visit: Payer: Self-pay

## 2023-11-16 ENCOUNTER — Emergency Department (HOSPITAL_COMMUNITY)

## 2023-11-16 ENCOUNTER — Inpatient Hospital Stay (HOSPITAL_COMMUNITY)
Admission: EM | Admit: 2023-11-16 | Discharge: 2023-12-06 | DRG: 286 | Disposition: A | Discharge status: Skilled Nursing Facility | Attending: Internal Medicine | Admitting: Internal Medicine

## 2023-11-16 ENCOUNTER — Other Ambulatory Visit: Payer: Self-pay

## 2023-11-16 ENCOUNTER — Telehealth: Payer: Self-pay | Admitting: Cardiology

## 2023-11-16 ENCOUNTER — Encounter (HOSPITAL_COMMUNITY): Payer: Self-pay

## 2023-11-16 ENCOUNTER — Ambulatory Visit: Admitting: Cardiology

## 2023-11-16 DIAGNOSIS — E039 Hypothyroidism, unspecified: Secondary | ICD-10-CM | POA: Diagnosis present

## 2023-11-16 DIAGNOSIS — I480 Paroxysmal atrial fibrillation: Secondary | ICD-10-CM | POA: Diagnosis present

## 2023-11-16 DIAGNOSIS — J449 Chronic obstructive pulmonary disease, unspecified: Secondary | ICD-10-CM | POA: Diagnosis present

## 2023-11-16 DIAGNOSIS — K921 Melena: Secondary | ICD-10-CM | POA: Diagnosis not present

## 2023-11-16 DIAGNOSIS — Z8249 Family history of ischemic heart disease and other diseases of the circulatory system: Secondary | ICD-10-CM

## 2023-11-16 DIAGNOSIS — E1165 Type 2 diabetes mellitus with hyperglycemia: Secondary | ICD-10-CM | POA: Diagnosis present

## 2023-11-16 DIAGNOSIS — E440 Moderate protein-calorie malnutrition: Secondary | ICD-10-CM | POA: Diagnosis present

## 2023-11-16 DIAGNOSIS — Z96611 Presence of right artificial shoulder joint: Secondary | ICD-10-CM | POA: Diagnosis present

## 2023-11-16 DIAGNOSIS — E66811 Obesity, class 1: Secondary | ICD-10-CM | POA: Diagnosis present

## 2023-11-16 DIAGNOSIS — R251 Tremor, unspecified: Secondary | ICD-10-CM | POA: Diagnosis not present

## 2023-11-16 DIAGNOSIS — I5082 Biventricular heart failure: Secondary | ICD-10-CM | POA: Diagnosis present

## 2023-11-16 DIAGNOSIS — Z87891 Personal history of nicotine dependence: Secondary | ICD-10-CM

## 2023-11-16 DIAGNOSIS — N1832 Chronic kidney disease, stage 3b: Secondary | ICD-10-CM | POA: Diagnosis present

## 2023-11-16 DIAGNOSIS — I5084 End stage heart failure: Secondary | ICD-10-CM | POA: Diagnosis present

## 2023-11-16 DIAGNOSIS — Z79899 Other long term (current) drug therapy: Secondary | ICD-10-CM

## 2023-11-16 DIAGNOSIS — I509 Heart failure, unspecified: Secondary | ICD-10-CM | POA: Diagnosis not present

## 2023-11-16 DIAGNOSIS — I429 Cardiomyopathy, unspecified: Secondary | ICD-10-CM | POA: Diagnosis present

## 2023-11-16 DIAGNOSIS — J4489 Other specified chronic obstructive pulmonary disease: Secondary | ICD-10-CM | POA: Diagnosis present

## 2023-11-16 DIAGNOSIS — E669 Obesity, unspecified: Secondary | ICD-10-CM | POA: Diagnosis present

## 2023-11-16 DIAGNOSIS — I10 Essential (primary) hypertension: Secondary | ICD-10-CM | POA: Diagnosis present

## 2023-11-16 DIAGNOSIS — I130 Hypertensive heart and chronic kidney disease with heart failure and stage 1 through stage 4 chronic kidney disease, or unspecified chronic kidney disease: Secondary | ICD-10-CM | POA: Diagnosis present

## 2023-11-16 DIAGNOSIS — N179 Acute kidney failure, unspecified: Secondary | ICD-10-CM | POA: Diagnosis not present

## 2023-11-16 DIAGNOSIS — R1310 Dysphagia, unspecified: Secondary | ICD-10-CM | POA: Diagnosis present

## 2023-11-16 DIAGNOSIS — I5023 Acute on chronic systolic (congestive) heart failure: Secondary | ICD-10-CM | POA: Diagnosis present

## 2023-11-16 DIAGNOSIS — I2489 Other forms of acute ischemic heart disease: Secondary | ICD-10-CM | POA: Diagnosis present

## 2023-11-16 DIAGNOSIS — Z8673 Personal history of transient ischemic attack (TIA), and cerebral infarction without residual deficits: Secondary | ICD-10-CM

## 2023-11-16 DIAGNOSIS — M545 Low back pain, unspecified: Secondary | ICD-10-CM | POA: Diagnosis present

## 2023-11-16 DIAGNOSIS — E875 Hyperkalemia: Secondary | ICD-10-CM | POA: Diagnosis present

## 2023-11-16 DIAGNOSIS — K761 Chronic passive congestion of liver: Secondary | ICD-10-CM | POA: Diagnosis present

## 2023-11-16 DIAGNOSIS — R0989 Other specified symptoms and signs involving the circulatory and respiratory systems: Secondary | ICD-10-CM | POA: Diagnosis present

## 2023-11-16 DIAGNOSIS — R197 Diarrhea, unspecified: Secondary | ICD-10-CM | POA: Diagnosis not present

## 2023-11-16 DIAGNOSIS — R7689 Other specified abnormal immunological findings in serum: Secondary | ICD-10-CM | POA: Diagnosis present

## 2023-11-16 DIAGNOSIS — E1122 Type 2 diabetes mellitus with diabetic chronic kidney disease: Secondary | ICD-10-CM | POA: Diagnosis present

## 2023-11-16 DIAGNOSIS — I5020 Unspecified systolic (congestive) heart failure: Secondary | ICD-10-CM | POA: Diagnosis not present

## 2023-11-16 DIAGNOSIS — Z711 Person with feared health complaint in whom no diagnosis is made: Secondary | ICD-10-CM | POA: Diagnosis not present

## 2023-11-16 DIAGNOSIS — I4891 Unspecified atrial fibrillation: Secondary | ICD-10-CM | POA: Diagnosis not present

## 2023-11-16 DIAGNOSIS — Z7985 Long-term (current) use of injectable non-insulin antidiabetic drugs: Secondary | ICD-10-CM

## 2023-11-16 DIAGNOSIS — I82443 Acute embolism and thrombosis of tibial vein, bilateral: Secondary | ICD-10-CM | POA: Diagnosis not present

## 2023-11-16 DIAGNOSIS — E871 Hypo-osmolality and hyponatremia: Secondary | ICD-10-CM | POA: Diagnosis present

## 2023-11-16 DIAGNOSIS — D696 Thrombocytopenia, unspecified: Secondary | ICD-10-CM | POA: Diagnosis not present

## 2023-11-16 DIAGNOSIS — R54 Age-related physical debility: Secondary | ICD-10-CM | POA: Diagnosis present

## 2023-11-16 DIAGNOSIS — I82403 Acute embolism and thrombosis of unspecified deep veins of lower extremity, bilateral: Secondary | ICD-10-CM | POA: Diagnosis present

## 2023-11-16 DIAGNOSIS — K219 Gastro-esophageal reflux disease without esophagitis: Secondary | ICD-10-CM | POA: Diagnosis not present

## 2023-11-16 DIAGNOSIS — Z7951 Long term (current) use of inhaled steroids: Secondary | ICD-10-CM

## 2023-11-16 DIAGNOSIS — E119 Type 2 diabetes mellitus without complications: Secondary | ICD-10-CM

## 2023-11-16 DIAGNOSIS — Z91018 Allergy to other foods: Secondary | ICD-10-CM

## 2023-11-16 DIAGNOSIS — Z9049 Acquired absence of other specified parts of digestive tract: Secondary | ICD-10-CM

## 2023-11-16 DIAGNOSIS — Z515 Encounter for palliative care: Secondary | ICD-10-CM | POA: Diagnosis not present

## 2023-11-16 DIAGNOSIS — D472 Monoclonal gammopathy: Secondary | ICD-10-CM | POA: Diagnosis present

## 2023-11-16 DIAGNOSIS — Z79890 Hormone replacement therapy (postmenopausal): Secondary | ICD-10-CM

## 2023-11-16 DIAGNOSIS — K7581 Nonalcoholic steatohepatitis (NASH): Secondary | ICD-10-CM | POA: Diagnosis not present

## 2023-11-16 DIAGNOSIS — R0682 Tachypnea, not elsewhere classified: Secondary | ICD-10-CM | POA: Diagnosis not present

## 2023-11-16 DIAGNOSIS — I2510 Atherosclerotic heart disease of native coronary artery without angina pectoris: Secondary | ICD-10-CM | POA: Diagnosis present

## 2023-11-16 DIAGNOSIS — Z794 Long term (current) use of insulin: Secondary | ICD-10-CM | POA: Diagnosis not present

## 2023-11-16 DIAGNOSIS — G4733 Obstructive sleep apnea (adult) (pediatric): Secondary | ICD-10-CM | POA: Diagnosis present

## 2023-11-16 DIAGNOSIS — M797 Fibromyalgia: Secondary | ICD-10-CM | POA: Diagnosis present

## 2023-11-16 DIAGNOSIS — Z881 Allergy status to other antibiotic agents status: Secondary | ICD-10-CM

## 2023-11-16 DIAGNOSIS — Z90711 Acquired absence of uterus with remaining cervical stump: Secondary | ICD-10-CM

## 2023-11-16 DIAGNOSIS — R791 Abnormal coagulation profile: Secondary | ICD-10-CM | POA: Diagnosis present

## 2023-11-16 DIAGNOSIS — E876 Hypokalemia: Secondary | ICD-10-CM | POA: Diagnosis present

## 2023-11-16 DIAGNOSIS — Z7901 Long term (current) use of anticoagulants: Secondary | ICD-10-CM

## 2023-11-16 DIAGNOSIS — R011 Cardiac murmur, unspecified: Secondary | ICD-10-CM | POA: Diagnosis present

## 2023-11-16 DIAGNOSIS — Z7189 Other specified counseling: Secondary | ICD-10-CM | POA: Diagnosis not present

## 2023-11-16 DIAGNOSIS — Z6831 Body mass index (BMI) 31.0-31.9, adult: Secondary | ICD-10-CM

## 2023-11-16 DIAGNOSIS — D631 Anemia in chronic kidney disease: Secondary | ICD-10-CM | POA: Diagnosis present

## 2023-11-16 DIAGNOSIS — Z825 Family history of asthma and other chronic lower respiratory diseases: Secondary | ICD-10-CM

## 2023-11-16 DIAGNOSIS — Z880 Allergy status to penicillin: Secondary | ICD-10-CM

## 2023-11-16 DIAGNOSIS — F32A Depression, unspecified: Secondary | ICD-10-CM | POA: Diagnosis present

## 2023-11-16 DIAGNOSIS — R633 Feeding difficulties, unspecified: Secondary | ICD-10-CM | POA: Diagnosis present

## 2023-11-16 DIAGNOSIS — Z8261 Family history of arthritis: Secondary | ICD-10-CM

## 2023-11-16 DIAGNOSIS — E785 Hyperlipidemia, unspecified: Secondary | ICD-10-CM | POA: Diagnosis not present

## 2023-11-16 DIAGNOSIS — N189 Chronic kidney disease, unspecified: Secondary | ICD-10-CM | POA: Diagnosis not present

## 2023-11-16 DIAGNOSIS — M7989 Other specified soft tissue disorders: Secondary | ICD-10-CM | POA: Diagnosis present

## 2023-11-16 DIAGNOSIS — K625 Hemorrhage of anus and rectum: Secondary | ICD-10-CM | POA: Diagnosis not present

## 2023-11-16 DIAGNOSIS — E1169 Type 2 diabetes mellitus with other specified complication: Secondary | ICD-10-CM | POA: Diagnosis present

## 2023-11-16 DIAGNOSIS — Z823 Family history of stroke: Secondary | ICD-10-CM

## 2023-11-16 DIAGNOSIS — K581 Irritable bowel syndrome with constipation: Secondary | ICD-10-CM | POA: Diagnosis present

## 2023-11-16 DIAGNOSIS — D649 Anemia, unspecified: Secondary | ICD-10-CM

## 2023-11-16 DIAGNOSIS — M1990 Unspecified osteoarthritis, unspecified site: Secondary | ICD-10-CM | POA: Diagnosis present

## 2023-11-16 DIAGNOSIS — Z833 Family history of diabetes mellitus: Secondary | ICD-10-CM

## 2023-11-16 DIAGNOSIS — E1140 Type 2 diabetes mellitus with diabetic neuropathy, unspecified: Secondary | ICD-10-CM | POA: Diagnosis present

## 2023-11-16 DIAGNOSIS — I3139 Other pericardial effusion (noninflammatory): Secondary | ICD-10-CM | POA: Diagnosis present

## 2023-11-16 DIAGNOSIS — R278 Other lack of coordination: Secondary | ICD-10-CM | POA: Diagnosis not present

## 2023-11-16 DIAGNOSIS — E7800 Pure hypercholesterolemia, unspecified: Secondary | ICD-10-CM | POA: Diagnosis present

## 2023-11-16 DIAGNOSIS — E7849 Other hyperlipidemia: Secondary | ICD-10-CM | POA: Diagnosis present

## 2023-11-16 DIAGNOSIS — R159 Full incontinence of feces: Secondary | ICD-10-CM | POA: Diagnosis not present

## 2023-11-16 DIAGNOSIS — Z86718 Personal history of other venous thrombosis and embolism: Secondary | ICD-10-CM

## 2023-11-16 DIAGNOSIS — K5909 Other constipation: Secondary | ICD-10-CM | POA: Diagnosis not present

## 2023-11-16 DIAGNOSIS — F419 Anxiety disorder, unspecified: Secondary | ICD-10-CM | POA: Diagnosis present

## 2023-11-16 DIAGNOSIS — N1831 Chronic kidney disease, stage 3a: Secondary | ICD-10-CM | POA: Diagnosis not present

## 2023-11-16 DIAGNOSIS — G8929 Other chronic pain: Secondary | ICD-10-CM | POA: Diagnosis present

## 2023-11-16 LAB — COMPREHENSIVE METABOLIC PANEL WITH GFR
ALT: 30 U/L (ref 0–44)
AST: 39 U/L (ref 15–41)
Albumin: 3.3 g/dL — ABNORMAL LOW (ref 3.5–5.0)
Alkaline Phosphatase: 410 U/L — ABNORMAL HIGH (ref 38–126)
Anion gap: 14 (ref 5–15)
BUN: 25 mg/dL — ABNORMAL HIGH (ref 8–23)
CO2: 21 mmol/L — ABNORMAL LOW (ref 22–32)
Calcium: 9.6 mg/dL (ref 8.9–10.3)
Chloride: 95 mmol/L — ABNORMAL LOW (ref 98–111)
Creatinine, Ser: 1.34 mg/dL — ABNORMAL HIGH (ref 0.44–1.00)
GFR, Estimated: 42 mL/min — ABNORMAL LOW (ref >60)
Glucose, Bld: 159 mg/dL — ABNORMAL HIGH (ref 70–99)
Potassium: 4.5 mmol/L (ref 3.5–5.1)
Sodium: 130 mmol/L — ABNORMAL LOW (ref 135–145)
Total Bilirubin: 3.1 mg/dL — ABNORMAL HIGH (ref 0.0–1.2)
Total Protein: 9.1 g/dL — ABNORMAL HIGH (ref 6.5–8.1)

## 2023-11-16 LAB — URINALYSIS, ROUTINE W REFLEX MICROSCOPIC
Bilirubin Urine: NEGATIVE
Glucose, UA: NEGATIVE mg/dL
Ketones, ur: NEGATIVE mg/dL
Leukocytes,Ua: NEGATIVE
Nitrite: NEGATIVE
Protein, ur: 30 mg/dL — AB
Specific Gravity, Urine: 1.006 (ref 1.005–1.030)
pH: 5 (ref 5.0–8.0)

## 2023-11-16 LAB — HEMOGLOBIN A1C
Hgb A1c MFr Bld: 6.7 % — ABNORMAL HIGH (ref 4.8–5.6)
Mean Plasma Glucose: 145.59 mg/dL

## 2023-11-16 LAB — CBC WITH DIFFERENTIAL/PLATELET
Abs Immature Granulocytes: 0.02 K/uL (ref 0.00–0.07)
Basophils Absolute: 0 K/uL (ref 0.0–0.1)
Basophils Relative: 0 %
Eosinophils Absolute: 0.1 K/uL (ref 0.0–0.5)
Eosinophils Relative: 1 %
HCT: 44.5 % (ref 36.0–46.0)
Hemoglobin: 14.2 g/dL (ref 12.0–15.0)
Immature Granulocytes: 0 %
Lymphocytes Relative: 10 %
Lymphs Abs: 0.7 K/uL (ref 0.7–4.0)
MCH: 27.4 pg (ref 26.0–34.0)
MCHC: 31.9 g/dL (ref 30.0–36.0)
MCV: 85.9 fL (ref 80.0–100.0)
Monocytes Absolute: 0.6 K/uL (ref 0.1–1.0)
Monocytes Relative: 8 %
Neutro Abs: 5.3 K/uL (ref 1.7–7.7)
Neutrophils Relative %: 81 %
Platelets: 240 K/uL (ref 150–400)
RBC: 5.18 MIL/uL — ABNORMAL HIGH (ref 3.87–5.11)
RDW: 15.8 % — ABNORMAL HIGH (ref 11.5–15.5)
WBC: 6.7 K/uL (ref 4.0–10.5)
nRBC: 0 % (ref 0.0–0.2)

## 2023-11-16 LAB — GLUCOSE, CAPILLARY
Glucose-Capillary: 151 mg/dL — ABNORMAL HIGH (ref 70–99)
Glucose-Capillary: 174 mg/dL — ABNORMAL HIGH (ref 70–99)

## 2023-11-16 LAB — PHOSPHORUS: Phosphorus: 3.2 mg/dL (ref 2.5–4.6)

## 2023-11-16 LAB — PRO BRAIN NATRIURETIC PEPTIDE: Pro Brain Natriuretic Peptide: 14798 pg/mL — ABNORMAL HIGH (ref <300.0)

## 2023-11-16 LAB — TROPONIN T, HIGH SENSITIVITY
Troponin T High Sensitivity: 164 ng/L (ref 0–19)
Troponin T High Sensitivity: 154 ng/L (ref 0–19)

## 2023-11-16 LAB — TSH: TSH: 6.73 u[IU]/mL — ABNORMAL HIGH (ref 0.350–4.500)

## 2023-11-16 LAB — MAGNESIUM: Magnesium: 1.6 mg/dL — ABNORMAL LOW (ref 1.7–2.4)

## 2023-11-16 MED ORDER — LEVOTHYROXINE SODIUM 100 MCG PO TABS
100.0000 ug | ORAL_TABLET | Freq: Every day | ORAL | Status: DC
  Administered 2023-11-17 – 2023-12-06 (×19): 100 ug via ORAL
  Filled 2023-11-16 (×21): qty 1

## 2023-11-16 MED ORDER — PREGABALIN 75 MG PO CAPS
75.0000 mg | ORAL_CAPSULE | Freq: Two times a day (BID) | ORAL | Status: DC
Start: 2023-11-16 — End: 2023-12-02
  Administered 2023-11-16 – 2023-12-02 (×32): 75 mg via ORAL
  Filled 2023-11-16 (×32): qty 1

## 2023-11-16 MED ORDER — VITAMIN C 500 MG PO TABS
500.0000 mg | ORAL_TABLET | Freq: Every day | ORAL | Status: DC
Start: 2023-11-16 — End: 2023-12-06
  Administered 2023-11-16 – 2023-12-05 (×19): 500 mg via ORAL
  Filled 2023-11-16 (×19): qty 1

## 2023-11-16 MED ORDER — METHOCARBAMOL 500 MG PO TABS
500.0000 mg | ORAL_TABLET | Freq: Three times a day (TID) | ORAL | Status: DC | PRN
Start: 2023-11-16 — End: 2023-12-06
  Administered 2023-11-17 – 2023-12-02 (×20): 500 mg via ORAL
  Filled 2023-11-16 (×21): qty 1

## 2023-11-16 MED ORDER — SACUBITRIL-VALSARTAN 24-26 MG PO TABS
1.0000 | ORAL_TABLET | Freq: Two times a day (BID) | ORAL | Status: DC
  Administered 2023-11-16 – 2023-11-24 (×16): 1 via ORAL
  Filled 2023-11-16 (×16): qty 1

## 2023-11-16 MED ORDER — FUROSEMIDE 10 MG/ML IJ SOLN
60.0000 mg | Freq: Once | INTRAMUSCULAR | Status: AC
  Administered 2023-11-16: 60 mg via INTRAVENOUS
  Filled 2023-11-16: qty 6

## 2023-11-16 MED ORDER — CYCLOSPORINE 0.05 % OP EMUL
1.0000 [drp] | Freq: Two times a day (BID) | OPHTHALMIC | Status: DC
  Administered 2023-11-16 – 2023-12-06 (×40): 1 [drp] via OPHTHALMIC
  Filled 2023-11-16 (×43): qty 30

## 2023-11-16 MED ORDER — INSULIN ASPART 100 UNIT/ML IJ SOLN
0.0000 [IU] | Freq: Every day | INTRAMUSCULAR | Status: DC
  Administered 2023-11-19 – 2023-12-02 (×3): 2 [IU] via SUBCUTANEOUS

## 2023-11-16 MED ORDER — IPRATROPIUM-ALBUTEROL 0.5-2.5 (3) MG/3ML IN SOLN: 3.0000 mL | Freq: Four times a day (QID) | RESPIRATORY_TRACT | Status: DC | PRN

## 2023-11-16 MED ORDER — LORATADINE 10 MG PO TABS
10.0000 mg | ORAL_TABLET | Freq: Every day | ORAL | Status: DC
  Administered 2023-11-16 – 2023-12-05 (×19): 10 mg via ORAL
  Filled 2023-11-16 (×20): qty 1

## 2023-11-16 MED ORDER — INSULIN GLARGINE 100 UNIT/ML ~~LOC~~ SOLN
8.0000 [IU] | Freq: Every day | SUBCUTANEOUS | Status: DC
  Administered 2023-11-16 – 2023-11-23 (×8): 8 [IU] via SUBCUTANEOUS
  Filled 2023-11-16 (×10): qty 0.08

## 2023-11-16 MED ORDER — URSODIOL 300 MG PO CAPS
300.0000 mg | ORAL_CAPSULE | Freq: Two times a day (BID) | ORAL | Status: DC
  Administered 2023-11-16 – 2023-12-06 (×40): 300 mg via ORAL
  Filled 2023-11-16 (×42): qty 1

## 2023-11-16 MED ORDER — MORPHINE SULFATE (PF) 4 MG/ML IV SOLN
4.0000 mg | Freq: Once | INTRAVENOUS | Status: AC
  Administered 2023-11-16: 4 mg via INTRAVENOUS
  Filled 2023-11-16: qty 1

## 2023-11-16 MED ORDER — ARFORMOTEROL TARTRATE 15 MCG/2ML IN NEBU
15.0000 ug | INHALATION_SOLUTION | Freq: Two times a day (BID) | RESPIRATORY_TRACT | Status: DC
  Administered 2023-11-16 – 2023-12-06 (×40): 15 ug via RESPIRATORY_TRACT
  Filled 2023-11-16 (×41): qty 2

## 2023-11-16 MED ORDER — ATORVASTATIN CALCIUM 80 MG PO TABS
80.0000 mg | ORAL_TABLET | Freq: Every day | ORAL | Status: DC
Start: 2023-11-16 — End: 2023-12-06
  Administered 2023-11-16 – 2023-12-05 (×20): 80 mg via ORAL
  Filled 2023-11-16 (×11): qty 1
  Filled 2023-11-16: qty 2
  Filled 2023-11-16 (×7): qty 1
  Filled 2023-11-16: qty 2

## 2023-11-16 MED ORDER — FUROSEMIDE 10 MG/ML IJ SOLN
60.0000 mg | Freq: Two times a day (BID) | INTRAMUSCULAR | Status: DC
  Administered 2023-11-16 – 2023-11-17 (×2): 60 mg via INTRAVENOUS
  Filled 2023-11-16 (×2): qty 6

## 2023-11-16 MED ORDER — PANTOPRAZOLE SODIUM 40 MG PO TBEC
40.0000 mg | DELAYED_RELEASE_TABLET | Freq: Every day | ORAL | Status: DC
  Administered 2023-11-16 – 2023-12-05 (×20): 40 mg via ORAL
  Filled 2023-11-16 (×3): qty 1
  Filled 2023-11-16 (×2): qty 2
  Filled 2023-11-16 (×15): qty 1

## 2023-11-16 MED ORDER — ACETAMINOPHEN 325 MG PO TABS
650.0000 mg | ORAL_TABLET | Freq: Four times a day (QID) | ORAL | Status: DC | PRN
  Administered 2023-11-18: 650 mg via ORAL
  Filled 2023-11-16 (×5): qty 2

## 2023-11-16 MED ORDER — METOLAZONE 5 MG PO TABS
2.5000 mg | ORAL_TABLET | Freq: Two times a day (BID) | ORAL | Status: AC
  Administered 2023-11-16 – 2023-11-17 (×2): 2.5 mg via ORAL
  Filled 2023-11-16 (×2): qty 1

## 2023-11-16 MED ORDER — DULOXETINE HCL 60 MG PO CPEP
60.0000 mg | ORAL_CAPSULE | Freq: Two times a day (BID) | ORAL | Status: DC
Start: 2023-11-16 — End: 2023-12-02
  Administered 2023-11-16 – 2023-12-02 (×32): 60 mg via ORAL
  Filled 2023-11-16 (×32): qty 1

## 2023-11-16 MED ORDER — UMECLIDINIUM BROMIDE 62.5 MCG/ACT IN AEPB
1.0000 | INHALATION_SPRAY | Freq: Every day | RESPIRATORY_TRACT | Status: DC
  Administered 2023-11-17 – 2023-12-06 (×19): 1 via RESPIRATORY_TRACT
  Filled 2023-11-16 (×3): qty 7

## 2023-11-16 MED ORDER — ONDANSETRON HCL 4 MG PO TABS: 4.0000 mg | ORAL_TABLET | Freq: Four times a day (QID) | ORAL | Status: DC | PRN

## 2023-11-16 MED ORDER — INSULIN ASPART 100 UNIT/ML IJ SOLN
0.0000 [IU] | Freq: Three times a day (TID) | INTRAMUSCULAR | Status: DC
  Administered 2023-11-16 – 2023-11-17 (×2): 2 [IU] via SUBCUTANEOUS
  Administered 2023-11-17 – 2023-11-18 (×5): 3 [IU] via SUBCUTANEOUS
  Administered 2023-11-19 – 2023-11-20 (×4): 2 [IU] via SUBCUTANEOUS
  Administered 2023-11-20: 3 [IU] via SUBCUTANEOUS
  Administered 2023-11-20: 5 [IU] via SUBCUTANEOUS
  Administered 2023-11-21: 3 [IU] via SUBCUTANEOUS
  Administered 2023-11-21 – 2023-11-30 (×10): 2 [IU] via SUBCUTANEOUS
  Administered 2023-11-30: 3 [IU] via SUBCUTANEOUS
  Administered 2023-11-30: 5 [IU] via SUBCUTANEOUS
  Administered 2023-12-01 (×3): 3 [IU] via SUBCUTANEOUS
  Administered 2023-12-02: 5 [IU] via SUBCUTANEOUS
  Administered 2023-12-02 (×2): 3 [IU] via SUBCUTANEOUS
  Administered 2023-12-03 – 2023-12-04 (×4): 2 [IU] via SUBCUTANEOUS
  Administered 2023-12-04: 3 [IU] via SUBCUTANEOUS
  Administered 2023-12-05: 2 [IU] via SUBCUTANEOUS

## 2023-11-16 MED ORDER — ONDANSETRON HCL 4 MG/2ML IJ SOLN
4.0000 mg | Freq: Once | INTRAMUSCULAR | Status: AC
  Administered 2023-11-16: 4 mg via INTRAVENOUS
  Filled 2023-11-16: qty 2

## 2023-11-16 MED ORDER — TRAMADOL HCL 50 MG PO TABS
50.0000 mg | ORAL_TABLET | Freq: Three times a day (TID) | ORAL | Status: DC | PRN
  Administered 2023-11-17 – 2023-12-01 (×15): 50 mg via ORAL
  Filled 2023-11-16 (×17): qty 1

## 2023-11-16 MED ORDER — BISOPROLOL FUMARATE 5 MG PO TABS
10.0000 mg | ORAL_TABLET | Freq: Every day | ORAL | Status: DC
Start: 2023-11-16 — End: 2023-11-27
  Administered 2023-11-16 – 2023-11-27 (×11): 10 mg via ORAL
  Filled 2023-11-16 (×11): qty 2

## 2023-11-16 MED ORDER — ONDANSETRON HCL 4 MG/2ML IJ SOLN
4.0000 mg | Freq: Four times a day (QID) | INTRAMUSCULAR | Status: DC | PRN
  Filled 2023-11-16: qty 2

## 2023-11-16 MED ORDER — APIXABAN 5 MG PO TABS
5.0000 mg | ORAL_TABLET | Freq: Two times a day (BID) | ORAL | Status: DC
  Administered 2023-11-16 – 2023-11-17 (×2): 5 mg via ORAL
  Filled 2023-11-16 (×2): qty 1

## 2023-11-16 MED ORDER — ACETAMINOPHEN 650 MG RE SUPP: 650.0000 mg | Freq: Four times a day (QID) | RECTAL | Status: DC | PRN

## 2023-11-16 MED ORDER — TRAZODONE HCL 50 MG PO TABS
150.0000 mg | ORAL_TABLET | Freq: Every day | ORAL | Status: DC
  Administered 2023-11-16 – 2023-12-05 (×20): 150 mg via ORAL
  Filled 2023-11-16 (×2): qty 1
  Filled 2023-11-16: qty 3
  Filled 2023-11-16 (×17): qty 1

## 2023-11-16 MED ORDER — HYDROXYZINE HCL 25 MG PO TABS
25.0000 mg | ORAL_TABLET | Freq: Three times a day (TID) | ORAL | Status: DC | PRN
  Administered 2023-11-17 – 2023-11-28 (×5): 25 mg via ORAL
  Filled 2023-11-16 (×5): qty 1

## 2023-11-16 NOTE — ED Notes (Signed)
 ED Provider at bedside.

## 2023-11-16 NOTE — ED Provider Notes (Signed)
 [place] EMERGENCY DEPARTMENT AT [facility] Provider Note   CSN: [id number] Arrival date & time: 11/16/23  1105     Patient presents with: generalized edema   Stephanie [name] is a 73 y.o. female.   Patient is a 73 year old female with a history of CHF who presents to the emergency department with a chief complaint of swelling to bilateral lower extremities, abdomen and shortness of breath.  Patient notes that she has been compliant with her 80 mg of torsemide .  She was due to see her cardiologist today but notes that the swelling had become progressively worse prompting her to come to the emergency department.  She denies any associated chest pain.  She has had no nausea, vomiting, diarrhea.  She admits to generalized pain throughout.        Prior to Admission medications   Medication Sig Start Date End Date Taking? Authorizing Provider  Accu-Chek FastClix Lancets MISC Use 1 lancet to check blood glucose four (4) times daily as directed by provider. 09/23/23   Therisa Benton PARAS, NP  ACCU-CHEK GUIDE test strip 4 (four) times daily. 07/02/20   [provider]  albuterol  (PROVENTIL ) (2.5 MG/3ML) 0.083% nebulizer solution Take 2.5 mg by nebulization every 6 (six) hours as needed for wheezing or shortness of breath.    [provider]  albuterol  (VENTOLIN  HFA) 108 (90 Base) MCG/ACT inhaler Inhale 1-2 puffs into the lungs every 6 (six) hours as needed for wheezing or shortness of breath. 03/21/21   Stuart Vernell Norris, PA-C  Alcohol Swabs (ALCOHOL PADS) 70 % PADS SMARTSIG:Pledget(s) Topical 4 Times Daily 08/12/20   [provider]  apixaban  (ELIQUIS ) 5 MG TABS tablet Take 1 tablet (5 mg total) by mouth 2 (two) times daily. 05/21/21   Arrien, Elidia Sieving, MD  ARNUITY ELLIPTA  100 MCG/ACT AEPB Inhale 1 puff into the lungs daily. 04/11/23   [provider]  Ascorbic Acid  (VITAMIN C ) 1000 MG tablet Take 500 mg by mouth daily.    [provider]   atorvastatin  (LIPITOR ) 80 MG tablet Take 1 tablet (80 mg total) by mouth daily. 05/22/21 06/24/58  Arrien, Elidia Sieving, MD  bisoprolol  (ZEBETA ) 10 MG tablet TAKE 1 TABLET(10 MG) BY MOUTH IN THE MORNING AND AT BEDTIME 06/08/23   Strader, [place] M, PA-C  Blood Glucose Calibration (ACCU-CHEK GUIDE CONTROL) LIQD See admin instructions. 07/02/20   [provider]  Blood Glucose Monitoring Suppl (ACCU-CHEK GUIDE ME) w/Device KIT Use Glucose Meter to monitor blood glucose readings four(4) times daily as directed by provider. 09/23/23   Therisa Benton PARAS, NP  Cetirizine  HCl 10 MG CAPS Take 10 mg by mouth daily.    [provider]  Cholecalciferol  (VITAMIN D ) 2000 units tablet Take 4,000 Units by mouth daily.  Patient taking differently: Take 2,000 Units by mouth daily.    [provider]  cimetidine  (TAGAMET ) 200 MG tablet Take 0.5 tablets (100 mg total) by mouth daily as needed. 03/22/20   Ricky Fines, MD  CINNAMON PO Take 1-2 capsules by mouth 3 (three) times daily.    [provider]  clobetasol  ointment (TEMOVATE ) 0.05 % Apply 1 application  topically as needed (imflammation). 06/20/15   [provider]  Continuous Glucose Receiver (FREESTYLE LIBRE 3 READER) DEVI USE TO MONITOR GLUCOSE CONTINUOUSLY AS DIRECTED 06/15/23   Therisa Benton PARAS, NP  Continuous Glucose Sensor (FREESTYLE LIBRE 3 PLUS SENSOR) MISC USE TO MONITOR GLUCOSE CONTINUOUSLY AS DIRECTED. CHANGE SENSOR EVERY 15 DAYS. 06/17/23  Therisa Benton PARAS, NP  DULoxetine  (CYMBALTA ) 60 MG capsule Take 60 mg by mouth 2 (two) times daily.    [provider]  EASY COMFORT PEN NEEDLES 31G X 5 MM MISC INJECT IN SULIN EVERY DAY AS DIRECTED 06/02/21   [provider]  Evolocumab  (REPATHA  SURECLICK) 140 MG/ML SOAJ Inject 140 mg into the skin every 14 (fourteen) days. 03/18/23   Whitfield Raisin, NP  fluticasone  (FLONASE ) 50 MCG/ACT nasal spray SMARTSIG:2 Spray(s) Both Nares Daily PRN 04/05/23    [provider]  glucose blood (ACCU-CHEK GUIDE TEST) test strip Use 1 test strip four(4) times daily to monitor blood glucose as directed by provider. 09/23/23   Therisa Benton PARAS, NP  guaiFENesin  (ROBITUSSIN) 100 MG/5ML SOLN Take 5 mLs (100 mg total) by mouth every 4 (four) hours as needed for cough or to loosen phlegm. Patient taking differently: Take 5 mLs by mouth as needed for cough or to loosen phlegm. 03/17/20   Maree, Pratik D, DO  hydrOXYzine  (ATARAX /VISTARIL ) 25 MG tablet Take 25 mg by mouth every 8 (eight) hours as needed for itching.  06/20/15   [provider]  insulin  degludec (TRESIBA  FLEXTOUCH) 100 UNIT/ML FlexTouch Pen Inject 15 Units into the skin at bedtime. 06/14/23   Therisa Benton PARAS, NP  Ipratropium-Albuterol  (COMBIVENT  RESPIMAT IN) Inhale 2 puffs into the lungs. Patient taking differently: Inhale 2 puffs into the lungs daily as needed.    [provider]  KLAYESTA  powder APPLY TOPICALLY THREE TIMES DAILY 09/20/23   Therisa Benton PARAS, NP  Lancet Devices (EASY MINI EJECT LANCING DEVICE) MISC 4 (four) times daily. 07/02/20   [provider]  levothyroxine  (SYNTHROID ) 100 MCG tablet Take 1 tablet (100 mcg total) by mouth daily before breakfast. 10/21/23   Therisa Benton PARAS, NP  lidocaine  (LIDODERM ) 5 % Place 1 patch onto the skin daily. Remove & Discard patch within 12 hours or as directed by MD Patient taking differently: Place 1 patch onto the skin daily as needed. Remove & Discard patch within 12 hours or as directed by MD 01/29/22   Leath-Warren, Etta PARAS, NP  lubiprostone  (AMITIZA ) 24 MCG capsule Take 1 capsule (24 mcg total) by mouth 2 (two) times daily with a meal. Patient taking differently: Take 24 mcg by mouth 2 (two) times daily as needed for constipation. 01/21/22   Ezzard Sonny RAMAN, PA-C  LYRICA  75 MG capsule Take 75 mg by mouth 2 (two) times daily.  07/18/15   [provider]  methocarbamol  (ROBAXIN ) 500 MG tablet Take 1 tablet  (500 mg total) by mouth 3 (three) times daily as needed. Patient taking differently: Take 500 mg by mouth 3 (three) times daily as needed for muscle spasms. 08/06/17   Kay Kemps, MD  montelukast  (SINGULAIR ) 10 MG tablet Take 10 mg by mouth at bedtime. 04/02/21   [provider]  pantoprazole  (PROTONIX ) 40 MG tablet Take 1 tablet (40 mg total) by mouth daily before breakfast. 01/21/22   Ezzard Sonny RAMAN, PA-C  RESTASIS 0.05 % ophthalmic emulsion Place 1 drop into both eyes 2 (two) times daily. 08/21/20   [provider]  sacubitril-valsartan (ENTRESTO ) 24-26 MG Take 1 tablet by mouth 2 (two) times daily. 08/03/23   [name] [name], MD  Simethicone 125 MG CAPS Take 1 capsule every day by oral route as needed.    [provider]  Skin Protectants, Misc. (INTERDRY 89K855) SHEE Change pad daily or if soiled 10/21/23   Therisa Benton PARAS, NP  spironolactone (  ALDACTONE) 25 MG tablet Take 25 mg by mouth daily.    [provider]  Tiotropium Bromide-Olodaterol (STIOLTO RESPIMAT ) 2.5-2.5 MCG/ACT AERS Inhale 2 puffs into the lungs daily. 03/19/23   Darlean Ozell NOVAK, MD  tirzepatide  (MOUNJARO ) 12.5 MG/0.5ML Pen Inject 12.5 mg into the skin once a week. 06/14/23   Therisa Benton PARAS, NP  torsemide  (DEMADEX ) 20 MG tablet Take 2 tablets (40 mg total) by mouth daily. Patient taking differently: Take 80 mg by mouth daily. 06/25/23   [name] [name], MD  traMADol  (ULTRAM ) 50 MG tablet Take 50 mg by mouth 3 (three) times daily as needed for moderate pain. 01/07/18   [provider]  traZODone  (DESYREL ) 150 MG tablet Take 150 mg by mouth at bedtime. 04/07/22   [provider]  ursodiol (ACTIGALL) 300 MG capsule Take 300 mg by mouth 2 (two) times daily.    [provider]  vitamin B-12 (CYANOCOBALAMIN ) 1000 MCG tablet Take 1,000 mcg by mouth daily.    [provider]    Allergies: Tetracyclines & related, Banana, and Penicillins    Review of  Systems  Respiratory:  Positive for shortness of breath.   Cardiovascular:  Positive for leg swelling.  All other systems reviewed and are negative.   Updated Vital Signs BP 128/89   Pulse 85   Wt 130.5 kg   SpO2 97%   BMI 43.73 kg/m   Physical Exam Vitals and nursing note reviewed.  Constitutional:      General: She is not in acute distress.    Appearance: Normal appearance. She is not ill-appearing.  HENT:     Head: Normocephalic and atraumatic.     Nose: Nose normal.     Mouth/Throat:     Mouth: Mucous membranes are moist.  Eyes:     Extraocular Movements: Extraocular movements intact.     Conjunctiva/sclera: Conjunctivae normal.     Pupils: Pupils are equal, round, and reactive to light.  Cardiovascular:     Rate and Rhythm: Normal rate and regular rhythm.     Pulses: Normal pulses.     Heart sounds: Normal heart sounds. No murmur heard.    No gallop.  Pulmonary:     Effort: Pulmonary effort is normal. No respiratory distress.     Breath sounds: No stridor. Rales present. No wheezing or rhonchi.  Abdominal:     General: Abdomen is flat. Bowel sounds are normal. There is no distension.     Palpations: Abdomen is soft.     Tenderness: There is no abdominal tenderness. There is no guarding.  Musculoskeletal:        General: Swelling and tenderness present. No deformity or signs of injury. Normal range of motion.     Cervical back: Normal range of motion and neck supple.     Right lower leg: Edema present.     Left lower leg: Edema present.  Skin:    General: Skin is warm and dry.     Findings: No bruising or rash.  Neurological:     General: No focal deficit present.     Mental Status: She is alert and oriented to person, place, and time. Mental status is at baseline.     Cranial Nerves: No cranial nerve deficit.     Sensory: No sensory deficit.     Motor: No weakness.     Coordination: Coordination normal.  Psychiatric:        Mood and Affect: Mood normal.  Behavior: Behavior normal.        Thought Content: Thought content normal.        Judgment: Judgment normal.     (all labs ordered are listed, but only abnormal results are displayed) Labs Reviewed  CBC WITH DIFFERENTIAL/PLATELET - Abnormal; Notable for the following components:      Result Value   RBC 5.18 (*)    RDW 15.8 (*)    All other components within normal limits  COMPREHENSIVE METABOLIC PANEL WITH GFR  PRO BRAIN NATRIURETIC PEPTIDE  URINALYSIS, ROUTINE W REFLEX MICROSCOPIC  TROPONIN T, HIGH SENSITIVITY    EKG: None  Radiology: [facility] Chest Port 1 View Result Date: 11/16/2023 CLINICAL DATA:  Dyspnea. EXAM: PORTABLE CHEST 1 VIEW COMPARISON:  April 29, 2023. FINDINGS: Mild cardiomegaly is noted with central pulmonary vascular congestion and possible minimal bilateral pulmonary edema. Status post right shoulder arthroplasty. IMPRESSION: Mild cardiomegaly with central pulmonary vascular congestion and possible minimal bilateral pulmonary edema. Electronically Signed   By: Lynwood Landy Raddle M.D.   On: 11/16/2023 11:42     Procedures   Medications Ordered in the ED  furosemide  (LASIX ) injection 60 mg (60 mg Intravenous Given 11/16/23 1202)                                    Medical Decision Making Amount and/or Complexity of Data Reviewed Labs: ordered. Radiology: ordered.  Risk Prescription drug management. Decision regarding hospitalization.   This patient presents to the ED for concern of shortness of breath and edema, this involves an extensive number of treatment options, and is a complaint that carries with it a high risk of complications and morbidity.  The differential diagnosis includes acute CHF, acute kidney injury, electrolyte derangement, pulmonary embolus, ACS, pericarditis, myocarditis, cirrhosis   Co morbidities that complicate the patient evaluation  CHF and chronic kidney disease   Additional history obtained:  Additional history obtained  from medical records External records from outside source obtained and reviewed including medical records   Lab Tests:  I Ordered, and personally interpreted labs.  The pertinent results include: No leukocytosis, no anemia, creatinine at baseline, liver function at baseline, mild hyponatremia, hypomagnesemia noted, elevated serial troponins, elevated BNP   Imaging Studies ordered:  I ordered imaging studies including chest x-ray I independently visualized and interpreted imaging which showed cardiomegaly and pulmonary vascular congestion I agree with the radiologist interpretation   Cardiac Monitoring: / EKG:  The patient was maintained on a cardiac monitor.  I personally viewed and interpreted the cardiac monitored which showed an underlying rhythm of: Normal sinus rhythm, no ST/T wave changes, no ischemic changes, no STEMI   Consultations Obtained:  I requested consultation with the cardiology, Dr. Debera,  and discussed lab and imaging findings as well as pertinent plan - they recommend: Will see the patient in consult   Problem List / ED Course / Critical interventions / Medication management  Patient is doing well at this time and does remain stable.  She is not requiring any extra oxygen  supplementation at this point.  Discussed with patient we will plan for admission to the hospitalist service for her apparent acute CHF.  She does have pulmonary vascular congestion on chest x-ray, increased peripheral edema and elevated BNP.  She has been given a dose of Lasix  in the emergency department.  Did discuss patient case with her cardiologist Dr. Debera who will see the patient  in consult.  Have discussed patient case with Dr. Ricky with the hospitalist service who has assessed her for admission. I ordered medication including morphine , Zofran , Lasix  for acute CHF, generalized pain Reevaluation of the patient after these medicines showed that the patient improved I have reviewed  the patients home medicines and have made adjustments as needed   Social Determinants of Health:  None   Test / Admission - Considered:  Admission     Final diagnoses:  None    ED Discharge Orders     None          Daralene Lonni JONETTA DEVONNA 11/16/23 1755    Suzette Pac, MD 11/17/23 1640

## 2023-11-16 NOTE — ED Notes (Signed)
 Primary RN, PA and MD made aware of critical Trop of 154

## 2023-11-16 NOTE — ED Triage Notes (Signed)
 Pt BIB [facility] for generalized weakness. Pt was supposed to meet with her heart doctor today, but couldn't get up from the fluid in her legs. Pt complaining of 10/10 pain on both sides of body. Pt takes eliquis   , but did not take this morning.

## 2023-11-16 NOTE — H&P (Signed)
 History and Physical    Patient: Stephanie [name] FMW:[id number] DOB: Feb 08, 1951 DOA: 11/16/2023 DOS: the patient was seen and examined on 11/16/2023 PCP: [facility], [facility]   Patient coming from: Home  Chief Complaint:  Chief Complaint  Patient presents with   generalized edema   HPI: Stephanie [name] is a 73 y.o. female with medical history significant of systolic heart failure, hypothyroidism, hyperlipidemia, COPD/asthma, history of DVT, fibromyalgia, GERD and type 2 diabetes mellitus with nephropathy; who presented to the hospital secondary to increased lower extremity swelling, shortness of breath with activity/orthopnea and weight gain.  Patient reports symptom has been present for the last 3 to 4 days and worsening; she was scheduled to be seen by cardiology service as an outpatient but unfortunately due to worsening of her symptoms did not make it and presented to the emergency department instead.    Patient denies fever, chest pain, nausea, vomiting, abdominal pain, dysuria/hematuria, melena/hematochezia, hematemesis, focal neurologic deficits and sick contacts.    Of note, patient reports to be compliant with her medications.  Workup in the ED demonstrating elevated BNP, signs of fluid overload on physical exam and a chest x-ray with vascular congestion/interstitial edema.  IV Lasix  given and TRH consulted to place patient in the hospital for further evaluation and management.  Review of Systems: As mentioned in the history of present illness. All other systems reviewed and are negative. 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   Type 2 diabetes mellitus (HCC)    Past Surgical History:  Procedure Laterality Date   ABDOMINAL HYSTERECTOMY  partial   CARPAL TUNNEL RELEASE Right 1991   CATARACT EXTRACTION W/PHACO Right 05/08/2013   Procedure: CATARACT EXTRACTION PHACO AND INTRAOCULAR LENS PLACEMENT (IOC);  Surgeon: Cherene Mania, MD;  Location: [facility];  Service: Ophthalmology;  Laterality: Right;  CDE 10.31   CATARACT EXTRACTION W/PHACO Left 08/17/2013   Procedure: CATARACT EXTRACTION PHACO AND INTRAOCULAR LENS PLACEMENT (IOC);  Surgeon: Cherene Mania, MD;  Location: [facility];  Service: Ophthalmology;  Laterality: Left;  CDE:9.03   CHOLECYSTECTOMY  1971   COLONOSCOPY WITH PROPOFOL  N/A 01/06/2016   Dr. Shaaron: diverticulosis    DENTAL SURGERY     ESOPHAGEAL BRUSHING  08/29/2019   Procedure: ESOPHAGEAL BRUSHING;  Surgeon: Shaaron Lamar HERO, MD;  Location: [facility];  Service: Endoscopy;;   ESOPHAGOGASTRODUODENOSCOPY (EGD) WITH PROPOFOL  N/A 01/06/2016   Dr. Shaaron: normal s/p empiric dilation    ESOPHAGOGASTRODUODENOSCOPY (EGD) WITH PROPOFOL  N/A 08/29/2019   esophageal plaques vs medication residue adherent to tubular esophagus s/p KOH brushing and dilation. Medium-sized hiatal hernia. + for candida. Diflucan .    EYE SURGERY     IR BONE MARROW BIOPSY & ASPIRATION  03/31/2022   MALONEY DILATION N/A 01/06/2016   Procedure: AGAPITO DILATION;  Surgeon: Lamar HERO Shaaron, MD;  Location: [facility];  Service: Endoscopy;  Laterality: N/A;   MALONEY DILATION N/A 08/29/2019   Procedure: MALONEY DILATION;  Surgeon:  Rourk, Lamar HERO, MD;  Location: [facility];  Service: Endoscopy;  Laterality: N/A;   REVERSE SHOULDER ARTHROPLASTY Right 08/06/2017   Procedure: RIGHT REVERSE SHOULDER ARTHROPLASTY;  Surgeon: Kay Kemps, MD;  Location: [facility] OR;  Service: Orthopedics;  Laterality: Right;   WRIST GANGLION EXCISION Left    Social History:   reports that she quit smoking about 12 years ago. Her smoking use included cigarettes. She started smoking about 42 years ago. She has a 7.5 pack-year smoking history. She has never used smokeless tobacco. She reports that she does not currently use alcohol. She reports that she does not use drugs.  Allergies  Allergen Reactions   Tetracyclines & Related Anaphylaxis and Rash   Banana Hives and Nausea And Vomiting   Penicillins Rash and Other (See Comments)    Family History  Problem Relation Age of Onset   Hypertension Mother    Diabetes Mother    COPD Mother    Arthritis Mother    Diabetes Father    Arthritis Father    Dementia Father    CAD Father    Hypothyroidism Sister    Diabetes Brother    Stroke Paternal Aunt    Colon cancer Niece    Colon polyps Neg Hx    Sleep apnea Neg Hx     Prior to Admission medications   Medication Sig Start Date End Date Taking? Authorizing Provider  albuterol  (PROVENTIL ) (2.5 MG/3ML) 0.083% nebulizer solution Take 2.5 mg by nebulization every 6 (six) hours as needed for wheezing or shortness of breath.   Yes [provider]  albuterol  (VENTOLIN  HFA) 108 (90 Base) MCG/ACT inhaler Inhale 1-2 puffs into the lungs every 6 (six) hours as needed for wheezing or shortness of breath. 03/21/21  Yes Stuart Vernell Norris, PA-C  Alcohol Swabs (ALCOHOL PADS) 70 % PADS SMARTSIG:Pledget(s) Topical 4 Times Daily 08/12/20  Yes [provider]  apixaban  (ELIQUIS ) 5 MG TABS tablet Take 1 tablet (5 mg total) by mouth 2 (two) times daily. 05/21/21  Yes Arrien, Mauricio Daniel, MD  Ascorbic Acid  (VITAMIN C ) 1000 MG tablet Take 500 mg by mouth daily.   Yes [provider]  atorvastatin  (LIPITOR ) 80 MG tablet Take 1 tablet (80 mg total) by mouth daily. 05/22/21 06/24/58 Yes Arrien, Elidia Sieving, MD  bisoprolol  (ZEBETA ) 10 MG tablet TAKE 1 TABLET(10 MG) BY MOUTH IN THE MORNING AND AT BEDTIME 06/08/23  Yes Strader, [place] M, PA-C  Cetirizine  HCl 10  MG CAPS Take 10 mg by mouth daily.   Yes [provider]  Cholecalciferol  (VITAMIN D ) 2000 units tablet Take 4,000 Units by mouth daily.  Patient taking differently: Take 2,000 Units by mouth daily.   Yes [provider]  cimetidine  (TAGAMET ) 200 MG tablet Take 0.5 tablets (100 mg total) by mouth daily as needed. 03/22/20  Yes Ricky Fines, MD  CINNAMON PO Take 1-2 capsules by mouth 3 (three) times daily.   Yes [provider]  clobetasol  ointment (TEMOVATE ) 0.05 % Apply 1 application  topically as needed (imflammation). 06/20/15  Yes [provider]  DULoxetine  (CYMBALTA ) 60 MG capsule Take 60 mg by mouth 2 (two) times daily.   Yes [provider]  Evolocumab  (REPATHA  SURECLICK) 140 MG/ML SOAJ Inject 140 mg into the skin every 14 (fourteen) days. 03/18/23  Yes Whitfield Raisin, NP  fluticasone  (FLONASE ) 50 MCG/ACT nasal spray SMARTSIG:2 Spray(s) Both Nares Daily PRN 04/05/23  Yes [provider]  guaiFENesin  (ROBITUSSIN) 100 MG/5ML SOLN Take 5 mLs (  100 mg total) by mouth every 4 (four) hours as needed for cough or to loosen phlegm. Patient taking differently: Take 5 mLs by mouth as needed for cough or to loosen phlegm. 03/17/20  Yes Shah, Pratik D, DO  hydrOXYzine  (ATARAX /VISTARIL ) 25 MG tablet Take 25 mg by mouth every 8 (eight) hours as needed for itching.  06/20/15  Yes [provider]  insulin  degludec (TRESIBA  FLEXTOUCH) 100 UNIT/ML FlexTouch Pen Inject 15 Units into the skin at bedtime. 06/14/23  Yes Therisa Benton PARAS, NP  KLAYESTA  powder APPLY TOPICALLY THREE TIMES DAILY 09/20/23  Yes Therisa Benton PARAS, NP  levothyroxine  (SYNTHROID ) 100 MCG tablet Take 1 tablet (100 mcg total) by mouth daily before breakfast. 10/21/23  Yes Therisa Benton PARAS, NP  lidocaine  (LIDODERM ) 5 % Place 1 patch onto the skin daily. Remove & Discard patch within 12 hours or as directed by MD Patient taking differently: Place 1 patch onto the skin daily as needed. Remove  & Discard patch within 12 hours or as directed by MD 01/29/22  Yes Leath-Warren, Etta PARAS, NP  lubiprostone  (AMITIZA ) 24 MCG capsule Take 1 capsule (24 mcg total) by mouth 2 (two) times daily with a meal. Patient taking differently: Take 24 mcg by mouth 2 (two) times daily as needed for constipation. 01/21/22  Yes Ezzard Sonny RAMAN, PA-C  LYRICA  75 MG capsule Take 75 mg by mouth 2 (two) times daily.  07/18/15  Yes [provider]  methocarbamol  (ROBAXIN ) 500 MG tablet Take 1 tablet (500 mg total) by mouth 3 (three) times daily as needed. Patient taking differently: Take 500 mg by mouth 3 (three) times daily as needed for muscle spasms. 08/06/17  Yes Kay Kemps, MD  pantoprazole  (PROTONIX ) 40 MG tablet Take 1 tablet (40 mg total) by mouth daily before breakfast. 01/21/22  Yes Ezzard Sonny RAMAN, PA-C  RESTASIS 0.05 % ophthalmic emulsion Place 1 drop into both eyes 2 (two) times daily. 08/21/20  Yes [provider]  sacubitril-valsartan (ENTRESTO ) 24-26 MG Take 1 tablet by mouth 2 (two) times daily. 08/03/23  Yes [name] [name], MD  Simethicone 125 MG CAPS Take 1 capsule every day by oral route as needed.   Yes [provider]  spironolactone (ALDACTONE) 25 MG tablet Take 25 mg by mouth daily.   Yes [provider]  Tiotropium Bromide-Olodaterol (STIOLTO RESPIMAT ) 2.5-2.5 MCG/ACT AERS Inhale 2 puffs into the lungs daily. 03/19/23  Yes Darlean Ozell NOVAK, MD  tirzepatide  (MOUNJARO ) 12.5 MG/0.5ML Pen Inject 12.5 mg into the skin once a week. 06/14/23  Yes Therisa Benton PARAS, NP  torsemide  (DEMADEX ) 20 MG tablet Take 2 tablets (40 mg total) by mouth daily. Patient taking differently: Take 80 mg by mouth daily. 06/25/23  Yes [name] [name], MD  traMADol  (ULTRAM ) 50 MG tablet Take 50 mg by mouth 3 (three) times daily as needed for moderate pain. 01/07/18  Yes [provider]  traZODone  (DESYREL ) 150 MG tablet Take 150 mg by mouth at bedtime. 04/07/22  Yes [provider]  ursodiol (ACTIGALL) 300 MG capsule Take 300 mg by mouth 2 (two) times daily.   Yes [provider]  Accu-Chek FastClix Lancets MISC Use 1 lancet to check blood glucose four (4) times daily as directed by provider. 09/23/23   Therisa Benton PARAS, NP  ACCU-CHEK GUIDE test strip 4 (four) times daily. 07/02/20   [provider]  ARNUITY ELLIPTA  100 MCG/ACT AEPB Inhale 1 puff into the lungs daily. 04/11/23   [provider]  Blood Glucose Calibration (ACCU-CHEK GUIDE CONTROL) LIQD See admin instructions. 07/02/20   [provider]  Blood Glucose Monitoring Suppl (ACCU-CHEK GUIDE ME) w/Device KIT Use Glucose Meter to monitor blood glucose readings four(4) times daily as directed by provider. 09/23/23   Therisa Benton PARAS, NP  Continuous Glucose Receiver (FREESTYLE LIBRE 3 READER) DEVI USE TO MONITOR GLUCOSE CONTINUOUSLY AS DIRECTED 06/15/23   Therisa Benton PARAS, NP  Continuous Glucose Sensor (FREESTYLE LIBRE 3 PLUS SENSOR) MISC USE TO MONITOR GLUCOSE CONTINUOUSLY AS DIRECTED. CHANGE SENSOR EVERY 15 DAYS. 06/17/23   Therisa Benton PARAS, NP  EASY COMFORT PEN NEEDLES 31G X 5 MM MISC INJECT IN SULIN EVERY DAY AS DIRECTED 06/02/21   [provider]  glucose blood (ACCU-CHEK GUIDE TEST) test strip Use 1 test strip four(4) times daily to monitor blood glucose as directed by provider. 09/23/23   Therisa Benton PARAS, NP  Lancet Devices (EASY MINI EJECT LANCING DEVICE) MISC 4 (four) times daily. 07/02/20   [provider]  Skin Protectants, Misc. [facility] [id number]) SHEE Change pad daily or if soiled 10/21/23   Therisa Benton PARAS, NP    Physical Exam: Vitals:   11/16/23 1215 11/16/23 1400 11/16/23 1445 11/16/23 1530  BP: 128/89 (!) 148/100 (!) 142/103 (!) 148/96  Pulse: 85 83 85 86  Resp:  11 10 16   Temp:  97.6 F (36.4 C)    TempSrc:  Oral    SpO2: 97% 98% 99% 100%  Weight:       General exam: Alert, awake, oriented x 3; following commands appropriately.   Reporting short winded sensation with activity. Respiratory system: Fine crackles appreciated bilaterally; no using accessory muscle.  Good saturation on room air. Cardiovascular system: Rate controlled, no rubs, no gallops, unable to assess JVD due to body habitus. Gastrointestinal system: Abdomen is obese, nondistended, soft and nontender.  Positive bowel sounds. Central nervous system: Moving 4 limbs spontaneously.  No focal neurological deficits. Extremities: No cyanosis or clubbing; 3+ edema appreciated bilaterally.  Patient reports tenderness due to ongoing swelling. Skin: No petechiae. Psychiatry: Judgement and insight appear normal. Mood & affect appropriate.   Data Reviewed: High sensitive troponin: 164 >> 154 Urinalysis: Specific gravity 1.006, clear appearance, negative nitrite and leukocyte esterase CBC: WBC 6.7, hemoglobin 14.1 platelet count 240K BNP: 14,798 Comprehensive metabolic panel: Sodium 130, potassium 4.5, chloride 95, bicarb 21, glucose 159, BUN 25, creatinine 1.34, AST 39, ALT 30, alkaline phosphatase 14 and GFR 42; anion gap 14   Assessment and Plan: 1-acute on chronic systolic heart failure - Patient with prior history of ejection fraction around 40% on previous echo. - Presenting with complaint of weight gain, orthopnea, lower extremity swelling and elevated BNP - There has also been findings of vascular congestion and interstitial pulmonary edema on x-ray. -Continue IV Lasix  and metolazone  -Follow low-sodium diet, daily weights, strict intake and output and Reds Clip measurement  - Cardiology service has been consulted - Continue Entresto , bisoprolol  and update echo. -Holding spironolactone initially in order to have more room for diuresis. - Telemetry monitoring and electrolytes follow-up/repletion has been ordered.  2-COPD/asthma - Appears to be stable and currently no signs of acute exacerbation - Continue bronchodilator management.  3-type 2 diabetes  with nephropathy - Update A1c - Sliding scale insulin  and long-acting insulin  has been ordered - Patient advised to maintain adequate oral hydration - Follow CBG fluctuation and adjust medication as needed.  4-chronic kidney disease stage IIIb  - With a slightly increase in  patient's creatinine from recent values most likely in the setting of decreased perfusion with CHF exacerbation - Continue diuresis - Follow-up renal function and urine output. - Minimize nephrotoxic agents and the use of contrast.  5-hypertension - Stable vital signs at the moment. - Continue current antihypertensive agent - Follow blood pressure trend.  6-GERD - Continue PPI  7-hypothyroidism - Update TSH - Continue Synthroid .  8-depression/anxiety/neuropathy and Fibromyalgia - Continue Cymbalta  and Atarax  - Continue Lyrica  and Robaxin  - Also resuming as needed tramadol  per patient's home regimen.  9-history of DVT and prior CVA - No acute neurologic deficit - Continue Eliquis  for secondary prevention - Continue Refacto modification.  10-hyperlipidemia - Resume outpatient Repatha  after discharge - Heart healthy diet discussed with patient.   Advance Care Planning:   Code Status: Full Code   Consults: cardiology service consulted by ED provider.  Family Communication: no family at bedside  Severity of Illness: The appropriate patient status for this patient is INPATIENT. Inpatient status is judged to be reasonable and necessary in order to provide the required intensity of service to ensure the patient's safety. The patient's presenting symptoms, physical exam findings, and initial radiographic and laboratory data in the context of their chronic comorbidities is felt to place them at high risk for further clinical deterioration. Furthermore, it is not anticipated that the patient will be medically stable for discharge from the hospital within 2 midnights of admission.   * I certify that at the point  of admission it is my clinical judgment that the patient will require inpatient hospital care spanning beyond 2 midnights from the point of admission due to high intensity of service, high risk for further deterioration and high frequency of surveillance required.*  Author: Eric Nunnery, MD 11/16/2023 3:41 PM  For on call review [email or web address].

## 2023-11-16 NOTE — Telephone Encounter (Signed)
 Patient cancel appt for today, and is going to the ER because she is swelling exteremly bad. Please advise

## 2023-11-16 NOTE — Telephone Encounter (Signed)
I will FYI Dr.McDowell 

## 2023-11-17 ENCOUNTER — Inpatient Hospital Stay (HOSPITAL_COMMUNITY)

## 2023-11-17 DIAGNOSIS — I5082 Biventricular heart failure: Secondary | ICD-10-CM | POA: Diagnosis not present

## 2023-11-17 DIAGNOSIS — I5023 Acute on chronic systolic (congestive) heart failure: Secondary | ICD-10-CM | POA: Diagnosis not present

## 2023-11-17 DIAGNOSIS — N179 Acute kidney failure, unspecified: Secondary | ICD-10-CM

## 2023-11-17 DIAGNOSIS — N189 Chronic kidney disease, unspecified: Secondary | ICD-10-CM

## 2023-11-17 LAB — GLUCOSE, CAPILLARY
Glucose-Capillary: 149 mg/dL — ABNORMAL HIGH (ref 70–99)
Glucose-Capillary: 172 mg/dL — ABNORMAL HIGH (ref 70–99)
Glucose-Capillary: 180 mg/dL — ABNORMAL HIGH (ref 70–99)
Glucose-Capillary: 168 mg/dL — ABNORMAL HIGH (ref 70–99)

## 2023-11-17 LAB — ECHOCARDIOGRAM COMPLETE
Area-P 1/2: 4.49 cm2
Calc EF: 41.2 %
Height: 68 in
S' Lateral: 3.8 cm
Single Plane A2C EF: 37.9 %
Single Plane A4C EF: 45.7 %
Weight: 4525.6 [oz_av]

## 2023-11-17 LAB — T4, FREE: Free T4: 2.4 ng/dL — ABNORMAL HIGH (ref 0.61–1.12)

## 2023-11-17 LAB — BASIC METABOLIC PANEL WITH GFR
Anion gap: 9 (ref 5–15)
BUN: 28 mg/dL — ABNORMAL HIGH (ref 8–23)
CO2: 25 mmol/L (ref 22–32)
Calcium: 8.9 mg/dL (ref 8.9–10.3)
Chloride: 98 mmol/L (ref 98–111)
Creatinine, Ser: 1.46 mg/dL — ABNORMAL HIGH (ref 0.44–1.00)
GFR, Estimated: 38 mL/min — ABNORMAL LOW (ref >60)
Glucose, Bld: 168 mg/dL — ABNORMAL HIGH (ref 70–99)
Potassium: 5 mmol/L (ref 3.5–5.1)
Sodium: 132 mmol/L — ABNORMAL LOW (ref 135–145)

## 2023-11-17 MED ORDER — MAGNESIUM SULFATE 2 GM/50ML IV SOLN
2.0000 g | Freq: Once | INTRAVENOUS | Status: AC
  Administered 2023-11-17: 2 g via INTRAVENOUS
  Filled 2023-11-17: qty 50

## 2023-11-17 MED ORDER — HEPARIN BOLUS VIA INFUSION
4000.0000 [IU] | Freq: Once | INTRAVENOUS | Status: DC
  Filled 2023-11-17: qty 4000

## 2023-11-17 MED ORDER — ALBUMIN HUMAN 25 % IV SOLN
25.0000 g | Freq: Four times a day (QID) | INTRAVENOUS | Status: AC
  Administered 2023-11-17 (×2): 25 g via INTRAVENOUS
  Filled 2023-11-17 (×2): qty 100

## 2023-11-17 MED ORDER — FUROSEMIDE 10 MG/ML IJ SOLN
10.0000 mg/h | INTRAVENOUS | Status: DC
  Administered 2023-11-17 – 2023-11-18 (×2): 10 mg/h via INTRAVENOUS
  Filled 2023-11-17 (×2): qty 20

## 2023-11-17 MED ORDER — HEPARIN (PORCINE) 25000 UT/250ML-% IV SOLN
1400.0000 [IU]/h | INTRAVENOUS | Status: DC
  Administered 2023-11-17 – 2023-11-18 (×2): 1400 [IU]/h via INTRAVENOUS
  Filled 2023-11-17 (×2): qty 250

## 2023-11-17 MED ORDER — DEXTROSE 5 % IV SOLN
10.0000 mg/h | INTRAVENOUS | Status: DC
  Filled 2023-11-17 (×2): qty 20

## 2023-11-17 NOTE — Hospital Course (Addendum)
 Mrs. Wampole was admitted to the hospital with the working diagnosis of heart failure exacerbation.   73 y.o. female with medical history significant of systolic heart failure, hypothyroidism, hyperlipidemia, COPD/asthma, history of DVT, fibromyalgia, GERD and type 2 diabetes mellitus, who presented to the hospital secondary to increased lower extremity edema, dyspnea on exertion, orthopnea and weight gain. Reported 3 to 4 days of worsening symptoms.    On her initial physical examination with blood pressure 148/100, HR 85, RR 10 and 02 saturation 98% Lungs with bilateral rales, heart S1 and S2 present and regular, abdomen with no distention, positive lower extremity edema +++.  Na 130, K 4.5 Cl 95 bicarbonate 21 glucose 159 bun 25 cr 1.34  AST 39 ALT 30  BNP 14,798 High sensitive troponin 164  Wbc 6.7 hgb 14.2 plt 240  Urine analysis SG 1.006, protein 30, negative leukocytes and negative hgb   Chest radiograph with cardiomegaly, bilateral hilar vascular congestion, bilateral central interstitial infiltrates, bilateral cephalization of the vasculature.   EKG 79 bpm, normal axis, qtc 523, sinus rhythm with PAC, no significant ST segment or T wave changes, low voltage    Echocardiograph with reduced LV systolic function and biventricular failure.  Patient was placed on IV furosemide  IV for diuresis.  10/02 transferred to [facility] from [facility]  10/10 Right heart catheterization with severe RV failure, low output state, felt to be end stage.  Placed on milrinone and furosemide  drip for palliative diuresis.  Developed lower gastrointestinal bleeding, diverticular or hemorrhoidal, conservative management recommended,  10/16 milrinone weaned off 10/17 off milrinone with good toleration.  10/18 patient medically stable to be transfer to SNF.  10/20 patient medically stable for transfer to SNF.

## 2023-11-17 NOTE — Progress Notes (Addendum)
 Nurse at bedside,New IV started 22 Gauge to right lateral forearm times one attempt,Patient tolerated procedure. Heparin  infusion started per MD's orders. Heparin  infusing at 14 ml/hr verified by two nurses,second nurse Clayborne Dade RN.Plan of care on going.

## 2023-11-17 NOTE — Progress Notes (Addendum)
 Nurse at bedside,New IV started  22 Gauge to the right Antecubital,times one attempt,patient tolerated procedure. Albumin 25 g given intravenous per MD's orders .Plan of care on going.

## 2023-11-17 NOTE — Progress Notes (Signed)
  Echocardiogram 2D Echocardiogram has been performed.  Devora Ellouise SAUNDERS 11/17/2023, 10:55 AM

## 2023-11-17 NOTE — Consult Note (Signed)
 Cardiology Consultation   [name] MRN: [id number]; DOB: December 27, 1950  Admit date: 11/16/2023 Date of Consult: 11/17/2023  PCP:  [facility]   [facility] Providers Cardiologist:  Jayson Sierras, MD        Patient Profile: Stephanie [name] is a 73 y.o. 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-55% by echo in 12/2022, 40% by echo in 05/2023), sinus tachycardia, HTN, HLD, Type 2 DM, IBS, history of DVT (on chronic anticoagulation with Eliquis ), history of CVA, MGUS, COPD and Stage 3 CKD who is being seen 11/17/2023 for the evaluation of CHF at the request of Dr. Ricky.  History of Present Illness: Stephanie [name] was last examined by Dr. Sierras in 07/2023 and reported that her lower extremity edema had improved.  Her weight was at 240 lbs and she was continued on Bisoprolol  10mg  BID, Eliquis  5 mg twice daily, Atorvastatin  80 mg daily, Repatha , Kerendia 10 mg daily, Torsemide  40 mg daily and Telmisartan 40 mg daily. Was recommended to consider switching from Telmisartan to Entresto  pending follow-up labs. Follow-up labs showed creatinine was stable at 1.31 and it was recommended to stop Telmisartan and switch to Entresto  24-26 mg twice daily.  She presented to [facility] ED yesterday for evaluation of worsening weakness and lower extremity edema.  In talking with the patient today, she reports worsening lower extremity edema and abdominal distention over the past several months. Says that her weight had increased to 257 lbs on her home scales but was significantly elevated at 287 lbs when checked on admission yesterday. She feels that her scales at home are inaccurate. Says that Nephrology has been adjusting her Torsemide  dose intermittently and she was taking 80 mg daily at times but did not notice significant improvement in symptoms. Has baseline dyspnea on exertion and uses a wheelchair for ambulation of longer distances. No specific chest pain or palpitations. She does  have 2-3 pillow orthopnea. Says that she has been without her CPAP for over a month due to issues with the equipment.  Initial labs showed WBC 6.7, Hgb 14.2, platelets 240, Na+ 130, K+ 4.5 and creatinine 1.34 (close to baseline).  Alkaline phosphatase 410, AST 39 and ALT 30. proBNP elevated at [address]. Troponin T at 164 and 154. TSH 6.730 with Free T3 and T4 pending. CXR showed mild cardiomegaly with central pulmonary vascular congestion and possible minimal bilateral pulmonary edema. EKG shows NSR, HR 79 with PAC's and no acute ST changes.  She was admitted for an acute CHF exacerbation and has been continued on Bisoprolol  10 mg daily and Entresto  24-26mg  BID. Spironolactone and Kerendia currently held. Received IV Lasix  60 mg for 2 doses while in the ED along with Metolazone  2.5 mg as well. Scheduled to receive IV Lasix  60 mg twice daily today. I&O's not fully recorded. Weight listed at 287 lbs on admission and 282 lbs today. Creatinine at 1.46 today and K+ at 5.0.   Past Medical History:  Diagnosis Date   Anemia    Arthritis    Asthma    COPD (chronic obstructive pulmonary disease) (HCC)    COVID-19    Deep vein thrombosis (DVT) of both lower extremities (HCC) 06/27/2015   Fibromyalgia    GERD (gastroesophageal reflux disease)    H/O hiatal hernia    Hypercholesteremia    Hypertension    Hyperthyroidism    IBS (irritable bowel syndrome)    Inappropriate sinus tachycardia    Inner ear disease  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   Type 2 diabetes mellitus (HCC)     Past Surgical History:  Procedure Laterality Date   ABDOMINAL HYSTERECTOMY  partial   CARPAL TUNNEL RELEASE Right 1991   CATARACT EXTRACTION W/PHACO Right 05/08/2013   Procedure: CATARACT EXTRACTION PHACO AND INTRAOCULAR LENS PLACEMENT (IOC);  Surgeon: Cherene Mania, MD;  Location: [facility];  Service: Ophthalmology;  Laterality: Right;  CDE 10.31   CATARACT EXTRACTION W/PHACO Left 08/17/2013    Procedure: CATARACT EXTRACTION PHACO AND INTRAOCULAR LENS PLACEMENT (IOC);  Surgeon: Cherene Mania, MD;  Location: [facility];  Service: Ophthalmology;  Laterality: Left;  CDE:9.03   CHOLECYSTECTOMY  1971   COLONOSCOPY WITH PROPOFOL  N/A 01/06/2016   Dr. Shaaron: diverticulosis    DENTAL SURGERY     ESOPHAGEAL BRUSHING  08/29/2019   Procedure: ESOPHAGEAL BRUSHING;  Surgeon: Shaaron Lamar HERO, MD;  Location: [facility];  Service: Endoscopy;;   ESOPHAGOGASTRODUODENOSCOPY (EGD) WITH PROPOFOL  N/A 01/06/2016   Dr. Shaaron: normal s/p empiric dilation    ESOPHAGOGASTRODUODENOSCOPY (EGD) WITH PROPOFOL  N/A 08/29/2019   esophageal plaques vs medication residue adherent to tubular esophagus s/p KOH brushing and dilation. Medium-sized hiatal hernia. + for candida. Diflucan .    EYE SURGERY     IR BONE MARROW BIOPSY & ASPIRATION  03/31/2022   MALONEY DILATION N/A 01/06/2016   Procedure: AGAPITO DILATION;  Surgeon: Lamar HERO Shaaron, MD;  Location: [facility];  Service: Endoscopy;  Laterality: N/A;   MALONEY DILATION N/A 08/29/2019   Procedure: AGAPITO DILATION;  Surgeon: Shaaron Lamar HERO, MD;  Location: [facility];  Service: Endoscopy;  Laterality: N/A;   REVERSE SHOULDER ARTHROPLASTY Right 08/06/2017   Procedure: RIGHT REVERSE SHOULDER ARTHROPLASTY;  Surgeon: Kay Kemps, MD;  Location[facility] OR;  Service: Orthopedics;  Laterality: Right;   WRIST GANGLION EXCISION Left      Home Medications:  Prior to Admission medications   Medication Sig Start Date End Date Taking? Authorizing Provider  albuterol  (PROVENTIL ) (2.5 MG/3ML) 0.083% nebulizer solution Take 2.5 mg by nebulization every 6 (six) hours as needed for wheezing or shortness of breath.   Yes [provider]  albuterol  (VENTOLIN  HFA) 108 (90 Base) MCG/ACT inhaler Inhale 1-2 puffs into the lungs every 6 (six) hours as needed for wheezing or shortness of breath. 03/21/21  Yes Stuart Vernell Norris, PA-C  Alcohol Swabs (ALCOHOL PADS) 70 % PADS  SMARTSIG:Pledget(s) Topical 4 Times Daily 08/12/20  Yes [provider]  apixaban  (ELIQUIS ) 5 MG TABS tablet Take 1 tablet (5 mg total) by mouth 2 (two) times daily. 05/21/21  Yes Arrien, Mauricio Daniel, MD  Ascorbic Acid  (VITAMIN C ) 1000 MG tablet Take 500 mg by mouth daily.   Yes [provider]  atorvastatin  (LIPITOR ) 80 MG tablet Take 1 tablet (80 mg total) by mouth daily. 05/22/21 06/24/58 Yes Arrien, Elidia Sieving, MD  bisoprolol  (ZEBETA ) 10 MG tablet TAKE 1 TABLET(10 MG) BY MOUTH IN THE MORNING AND AT BEDTIME 06/08/23  Yes Calyse Murcia, [place] M, PA-C  Cetirizine  HCl 10 MG CAPS Take 10 mg by mouth daily.   Yes [provider]  Cholecalciferol  (VITAMIN D ) 2000 units tablet Take 4,000 Units by mouth daily.  Patient taking differently: Take 2,000 Units by mouth daily.   Yes [provider]  cimetidine  (TAGAMET ) 200 MG tablet Take 0.5 tablets (100 mg total) by mouth daily as needed. 03/22/20  Yes Ricky Fines, MD  CINNAMON PO Take 1-2 capsules by mouth 3 (three) times daily.  Yes [provider]  clobetasol  ointment (TEMOVATE ) 0.05 % Apply 1 application  topically as needed (imflammation). 06/20/15  Yes [provider]  DULoxetine  (CYMBALTA ) 60 MG capsule Take 60 mg by mouth 2 (two) times daily.   Yes [provider]  Evolocumab  (REPATHA  SURECLICK) 140 MG/ML SOAJ Inject 140 mg into the skin every 14 (fourteen) days. 03/18/23  Yes Whitfield Raisin, NP  fluticasone  (FLONASE ) 50 MCG/ACT nasal spray SMARTSIG:2 Spray(s) Both Nares Daily PRN 04/05/23  Yes [provider]  guaiFENesin  (ROBITUSSIN) 100 MG/5ML SOLN Take 5 mLs (100 mg total) by mouth every 4 (four) hours as needed for cough or to loosen phlegm. Patient taking differently: Take 5 mLs by mouth as needed for cough or to loosen phlegm. 03/17/20  Yes Shah, Pratik D, DO  hydrOXYzine  (ATARAX /VISTARIL ) 25 MG tablet Take 25 mg by mouth every 8 (eight) hours as needed for itching.  06/20/15  Yes  [provider]  insulin  degludec (TRESIBA  FLEXTOUCH) 100 UNIT/ML FlexTouch Pen Inject 15 Units into the skin at bedtime. 06/14/23  Yes Therisa Benton PARAS, NP  KLAYESTA  powder APPLY TOPICALLY THREE TIMES DAILY 09/20/23  Yes Therisa Benton PARAS, NP  levothyroxine  (SYNTHROID ) 100 MCG tablet Take 1 tablet (100 mcg total) by mouth daily before breakfast. 10/21/23  Yes Therisa Benton PARAS, NP  lidocaine  (LIDODERM ) 5 % Place 1 patch onto the skin daily. Remove & Discard patch within 12 hours or as directed by MD Patient taking differently: Place 1 patch onto the skin daily as needed. Remove & Discard patch within 12 hours or as directed by MD 01/29/22  Yes Leath-Warren, Etta PARAS, NP  lubiprostone  (AMITIZA ) 24 MCG capsule Take 1 capsule (24 mcg total) by mouth 2 (two) times daily with a meal. Patient taking differently: Take 24 mcg by mouth 2 (two) times daily as needed for constipation. 01/21/22  Yes Ezzard Sonny RAMAN, PA-C  LYRICA  75 MG capsule Take 75 mg by mouth 2 (two) times daily.  07/18/15  Yes [provider]  methocarbamol  (ROBAXIN ) 500 MG tablet Take 1 tablet (500 mg total) by mouth 3 (three) times daily as needed. Patient taking differently: Take 500 mg by mouth 3 (three) times daily as needed for muscle spasms. 08/06/17  Yes Kay Kemps, MD  pantoprazole  (PROTONIX ) 40 MG tablet Take 1 tablet (40 mg total) by mouth daily before breakfast. 01/21/22  Yes Ezzard Sonny RAMAN, PA-C  RESTASIS 0.05 % ophthalmic emulsion Place 1 drop into both eyes 2 (two) times daily. 08/21/20  Yes [provider]  sacubitril-valsartan (ENTRESTO ) 24-26 MG Take 1 tablet by mouth 2 (two) times daily. 08/03/23  Yes [name] [name], MD  Simethicone 125 MG CAPS Take 1 capsule every day by oral route as needed.   Yes [provider]  spironolactone (ALDACTONE) 25 MG tablet Take 25 mg by mouth daily.   Yes [provider]  Tiotropium Bromide-Olodaterol (STIOLTO RESPIMAT ) 2.5-2.5 MCG/ACT AERS  Inhale 2 puffs into the lungs daily. 03/19/23  Yes Darlean Ozell NOVAK, MD  tirzepatide  (MOUNJARO ) 12.5 MG/0.5ML Pen Inject 12.5 mg into the skin once a week. 06/14/23  Yes Therisa Benton PARAS, NP  torsemide  (DEMADEX ) 20 MG tablet Take 2 tablets (40 mg total) by mouth daily. Patient taking differently: Take 80 mg by mouth daily. 06/25/23  Yes [name] [name], MD  traMADol  (ULTRAM ) 50 MG tablet Take 50 mg by mouth 3 (three) times daily as needed for moderate pain. 01/07/18  Yes [provider]  traZODone  (DESYREL )  150 MG tablet Take 150 mg by mouth at bedtime. 04/07/22  Yes [provider]  ursodiol (ACTIGALL) 300 MG capsule Take 300 mg by mouth 2 (two) times daily.   Yes [provider]  Accu-Chek FastClix Lancets MISC Use 1 lancet to check blood glucose four (4) times daily as directed by provider. 09/23/23   Therisa Benton PARAS, NP  ACCU-CHEK GUIDE test strip 4 (four) times daily. 07/02/20   [provider]  ARNUITY ELLIPTA  100 MCG/ACT AEPB Inhale 1 puff into the lungs daily. 04/11/23   [provider]  Blood Glucose Calibration (ACCU-CHEK GUIDE CONTROL) LIQD See admin instructions. 07/02/20   [provider]  Blood Glucose Monitoring Suppl (ACCU-CHEK GUIDE ME) w/Device KIT Use Glucose Meter to monitor blood glucose readings four(4) times daily as directed by provider. 09/23/23   Therisa Benton PARAS, NP  Continuous Glucose Receiver (FREESTYLE LIBRE 3 READER) DEVI USE TO MONITOR GLUCOSE CONTINUOUSLY AS DIRECTED 06/15/23   Therisa Benton PARAS, NP  Continuous Glucose Sensor (FREESTYLE LIBRE 3 PLUS SENSOR) MISC USE TO MONITOR GLUCOSE CONTINUOUSLY AS DIRECTED. CHANGE SENSOR EVERY 15 DAYS. 06/17/23   Therisa Benton PARAS, NP  EASY COMFORT PEN NEEDLES 31G X 5 MM MISC INJECT IN SULIN EVERY DAY AS DIRECTED 06/02/21   [provider]  glucose blood (ACCU-CHEK GUIDE TEST) test strip Use 1 test strip four(4) times daily to monitor blood glucose as directed by provider.  09/23/23   Therisa Benton PARAS, NP  Lancet Devices (EASY MINI EJECT LANCING DEVICE) MISC 4 (four) times daily. 07/02/20   [provider]  Skin Protectants, Misc. (INTERDRY [id number]) SHEE Change pad daily or if soiled 10/21/23   Therisa Benton PARAS, NP    Scheduled Meds:  apixaban   5 mg Oral BID   arformoterol  15 mcg Nebulization BID   And   umeclidinium bromide  1 puff Inhalation Daily   vitamin C   500 mg Oral Daily   atorvastatin   80 mg Oral Daily   bisoprolol   10 mg Oral Daily   cycloSPORINE  1 drop Both Eyes BID   DULoxetine   60 mg Oral BID   furosemide   60 mg Intravenous Q12H   insulin  aspart  0-15 Units Subcutaneous TID WC   insulin  aspart  0-5 Units Subcutaneous QHS   insulin  glargine  8 Units Subcutaneous QHS   levothyroxine   100 mcg Oral QAC breakfast   loratadine   10 mg Oral Daily   metolazone   2.5 mg Oral BID   pantoprazole   40 mg Oral Daily   pregabalin   75 mg Oral BID   sacubitril-valsartan  1 tablet Oral BID   traZODone   150 mg Oral QHS   ursodiol  300 mg Oral BID   Continuous Infusions:  PRN Meds: acetaminophen  **OR** acetaminophen , hydrOXYzine , ipratropium-albuterol , methocarbamol , ondansetron  **OR** ondansetron  (ZOFRAN ) IV, traMADol   Allergies:    Allergies  Allergen Reactions   Tetracyclines & Related Anaphylaxis and Rash   Banana Hives and Nausea And Vomiting   Penicillins Rash and Other (See Comments)    Social History:   Social History   Socioeconomic History   Marital status: Divorced    Spouse name: Not on file   Number of children: Not on file   Years of education: Not on file   Highest education level: Not on file  Occupational History   Not on file  Tobacco Use   Smoking status: Former    Current packs/day: 0.00    Average packs/day: 0.3 packs/day for 30.0  years (7.5 ttl pk-yrs)    Types: Cigarettes    Start date: 02/16/1981    Quit date: 02/17/2011    Years since quitting: 12.7   Smokeless tobacco: Never  Vaping Use   Vaping  status: Never Used  Substance and Sexual Activity   Alcohol use: Not Currently    Comment: rare   Drug use: No   Sexual activity: Not Currently    Birth control/protection: Surgical  Other Topics Concern   Not on file  Social History Narrative   Pt lives with daughter    Retired    Social Drivers of Corporate investment banker Strain: Not on file  Food Insecurity: No Food Insecurity (11/16/2023)   Hunger Vital Sign    Worried About Running Out of Food in the Last Year: Never true    Ran Out of Food in the Last Year: Never true  Transportation Needs: No Transportation Needs (11/16/2023)   PRAPARE - Administrator, Civil Service (Medical): No    Lack of Transportation (Non-Medical): No  Physical Activity: Not on file  Stress: Not on file  Social Connections: Moderately Isolated (11/16/2023)   Social Connection and Isolation Panel    Frequency of Communication with Friends and Family: More than three times a week    Frequency of Social Gatherings with Friends and Family: More than three times a week    Attends Religious Services: 1 to 4 times per year    Active Member of [employer] or Organizations: No    Attends Banker Meetings: Never    Marital Status: Divorced  Catering manager Violence: Not At Risk (11/16/2023)   Humiliation, Afraid, Rape, and Kick questionnaire    Fear of Current or Ex-Partner: No    Emotionally Abused: No    Physically Abused: No    Sexually Abused: No    Family History:    Family History  Problem Relation Age of Onset   Hypertension Mother    Diabetes Mother    COPD Mother    Arthritis Mother    Diabetes Father    Arthritis Father    Dementia Father    CAD Father    Hypothyroidism Sister    Diabetes Brother    Stroke Paternal Aunt    Colon cancer Niece    Colon polyps Neg Hx    Sleep apnea Neg Hx      ROS:  Please see the history of present illness.   All other ROS reviewed and negative.     Physical  Exam/Data: Vitals:   11/16/23 2003 11/16/23 2100 11/17/23 0021 11/17/23 0433  BP:   105/62 110/68  Pulse:    82  Resp:   16 16  Temp:   97.6 F (36.4 C) 97.7 F (36.5 C)  TempSrc:   Oral Oral  SpO2: 96%  96% 96%  Weight:  130.5 kg  128.3 kg  Height:  5' 8 (1.727 m)      Intake/Output Summary (Last 24 hours) at 11/17/2023 0757 Last data filed at 11/16/2023 2130 Gross per 24 hour  Intake 360 ml  Output 500 ml  Net -140 ml      11/17/2023    4:33 AM 11/16/2023    9:00 PM 11/16/2023   11:18 AM  Last 3 Weights  Weight (lbs) 282 lb 13.6 oz 287 lb 11.2 oz 287 lb 9.6 oz  Weight (kg) 128.3 kg 130.5 kg 130.455 kg     Body mass index is  43.01 kg/m.  General:  Pleasant, obese female appearing in no acute distress.  HEENT: normal Neck: JVD difficult to assess secondary to body habitus.  Vascular: No carotid bruits; Distal pulses 2+ bilaterally Cardiac:  normal S1, S2; RRR; no murmur  Lungs: decreased breath sounds along bases bilaterally.  Abd: firm; appears distended.  Ext: 2+ pitting edema bilaterally.  Musculoskeletal:  No deformities, BUE and BLE strength normal and equal Skin: warm and dry  Neuro:  CNs 2-12 intact, no focal abnormalities noted Psych:  Normal affect   EKG:  The EKG was personally reviewed and demonstrates:  NSR, HR 79 with PAC's and no acute ST changes. Telemetry:  Telemetry was personally reviewed and demonstrates:  NSR, HR in 70's to 80's with PVC's.   Relevant CV Studies:  Limited Echo: 05/2023 IMPRESSIONS     1. Left ventricular ejection fraction, by estimation, is 40%. The left  ventricle has moderately decreased function. The left ventricle  demonstrates global hypokinesis.   2. Right ventricular systolic function is low normal. The right  ventricular size is mildly enlarged.   3. The inferior vena cava is normal in size with greater than 50%  respiratory variability, suggesting right atrial pressure of 3 mmHg.    Laboratory Data: High  Sensitivity Troponin:  No results for input(s): TROPONINIHS in the last 720 hours.   Chemistry Recent Labs  Lab 11/16/23 1201 11/16/23 1325 11/17/23 0427  NA 130*  --  132*  K 4.5  --  5.0  CL 95*  --  98  CO2 21*  --  25  GLUCOSE 159*  --  168*  BUN 25*  --  28*  CREATININE 1.34*  --  1.46*  CALCIUM  9.6  --  8.9  MG  --  1.6*  --   GFRNONAA 42*  --  38*  ANIONGAP 14  --  9    Recent Labs  Lab 11/16/23 1201  PROT 9.1*  ALBUMIN 3.3*  AST 39  ALT 30  ALKPHOS 410*  BILITOT 3.1*   Lipids No results for input(s): CHOL, TRIG, HDL, LABVLDL, LDLCALC, CHOLHDL in the last 168 hours.  Hematology Recent Labs  Lab 11/16/23 1201  WBC 6.7  RBC 5.18*  HGB 14.2  HCT 44.5  MCV 85.9  MCH 27.4  MCHC 31.9  RDW 15.8*  PLT 240   Thyroid   Recent Labs  Lab 11/16/23 1335  TSH 6.730*    BNP Recent Labs  Lab 11/16/23 1201  PROBNP 14,798.0*    DDimer No results for input(s): DDIMER in the last 168 hours.  Radiology/Studies:  DG Chest Port 1 View Result Date: 11/16/2023 CLINICAL DATA:  Dyspnea. EXAM: PORTABLE CHEST 1 VIEW COMPARISON:  April 29, 2023. FINDINGS: Mild cardiomegaly is noted with central pulmonary vascular congestion and possible minimal bilateral pulmonary edema. Status post right shoulder arthroplasty. IMPRESSION: Mild cardiomegaly with central pulmonary vascular congestion and possible minimal bilateral pulmonary edema. Electronically Signed   By: Lynwood Landy Raddle M.D.   On: 11/16/2023 11:42     Assessment and Plan:  1. Acute on Chronic HFmrEF - Presents with at least a 40+ pound weight gain over the past 4 months as she was previously at 240 lbs in 07/2023 and weight was recorded at 287 lbs on admission. Diuretics intermittently titrated by Nephrology as an outpatient but she reports minimal improvement in symptoms with this. - proBNP elevated at 14,798 on admission. Hs Troponin T at 164 and 154 which is likely due to  demand ischemia as she denies  any anginal symptoms. A repeat echocardiogram is pending to reassess her EF as this was at 40% by echocardiogram in 05/2023.   - She has been started on IV Lasix  60 mg twice daily and given that she received Metolazone  yesterday with worsening renal function, would hold Metolazone  today. Based off urine output today and repeat labs in AM, can titrate IV Lasix  to 80mg  BID or consider a Lasix  drip given her significant fluid overload. Follow I's and O's along with daily weights.  - Continue Bisoprolol  10 mg daily (on this due to COPD) and Entresto  24-26 mg twice daily. Given her K+ at 5.0, would hold Spironolactone (previously on [place]) and would await echo results prior to determining which one to resume. If EF < 40%, prefer Spironolactone if K+ allows. She was previously intolerant to SGLT2i due to fatigue and frequent yeast infections.   2. HTN - BP has overall well-controlled, at 119/69 on most recent check. Remains on Bisoprolol  10 mg daily (listed as taking BID prior to admission) and Entresto  24-26 mg twice daily.  3. HLD - She has been continued on Atorvastatin  80mg  daily.   4. History of DVT - She has been continued on Eliquis  5mg  BID for anticoagulation. CBC on admission showed Hgb was stable at 14.2 and platekets at 240 K.   5. Stage 3 CKD - Followed by Dr. Rachele as an outpatient. Baseline creatinine 1.2 - 1.3. At 1.34 on admission and trending up to 1.46 today. Continue to follow with diuresis.   6. Hypothyroidism - TSH at 6.730 on admission and Free T3/T4 pending. Remains on Synthroid  100 mcg daily.   7. OSA - She has been without her CPAP for over a month due to equipment issues. Was encouraged to resume use once able.   8. Elevated Alk Phos - Appears chronically elevated by review of labs but significantly higher at 410 on admission. US  Abdomen in 08/2023 was suggestive of cirrhosis and she is s/p  cholecystectomy. Further workup per the admitting team.    Risk  Assessment/Risk Scores:  [place]  Heart Association (NYHA) Functional Class NYHA Class IV    For questions or updates, please contact [facility] Please consult [email or web address] for contact info under   Signed, Laymon CHRISTELLA Qua, PA-C  11/17/2023 7:57 AM

## 2023-11-17 NOTE — Progress Notes (Addendum)
 PROGRESS NOTE    Patient: Stephanie [name]                            PCP: Roni, [facility]                    DOB: 1950/09/13            DOA: 11/16/2023 FMW:[id number]             DOS: 11/17/2023, 11:49 AM   LOS: 1 day   Date of Service: The patient was seen and examined on 11/17/2023  Subjective:   The patient was seen and examined this morning. Requiring 2 L of oxygen , satting 96% otherwise hemodynamically stable Shortness of breath with minimal exertion  Brief Narrative:   Stephanie [name] is a 73 y.o. female with medical history significant of systolic heart failure, hypothyroidism, hyperlipidemia, COPD/asthma, history of DVT, fibromyalgia, GERD and type 2 diabetes mellitus with nephropathy; who presented to the hospital secondary to increased lower extremity swelling, shortness of breath with activity/orthopnea and weight gain.  Patient reports symptom has been present for the last 3 to 4 days and worsening; she was scheduled to be seen by cardiology service as an outpatient but unfortunately due to worsening of her symptoms did not make it and presented to the emergency department instead.     Of note, patient reports to be compliant with her medications.   ED:  Demonstrating elevated BNP, signs of fluid overload on physical exam and a chest x-ray with vascular congestion/interstitial edema.  IV Lasix  given.  Assessment & Plan:   Principal Problem:   Heart failure, systolic, acute on chronic (HCC)    AKI  COPD/asthma   Assessment and Plan:  Acute on chronic systolic heart failure - Hemodynamically stable, satting 100% on 1 L of oxygen   - h/o EJF around 40% on previous echo. - Presenting with complaint of weight gain, orthopnea, lower extremity swelling and elevated BNP - Imaging consistent with vascular congestion, and interstitial pulmonary edema on x-ray. - Pending repeat Echo >>   -BNP 14,798.0 - Troponin 164, 154, Filed Weights   11/16/23 1118 11/16/23 2100  11/17/23 0433  Weight: 130.5 kg 130.5 kg 128.3 kg    Intake/Output Summary (Last 24 hours) at 11/17/2023 1144 Last data filed at 11/17/2023 1121 Gross per 24 hour  Intake 360 ml  Output 1100 ml  Net -740 ml    - Cardiology consulted, low respond to IV Lasix  cardiology initiated Lasix  drip 11/17/2023 - Continue Entresto , Bisoprolol ,    -Follow low-sodium diet, daily weights, strict intake and output and Reds Clip measurement   -Holding spironolactone initially in order to have more room for diuresis.  - Telemetry monitoring and electrolytes follow-up/repletion    Addendum: Per cardiology Dr. Bettyjo Echo showed 35-40% LVEF with RWMA and severe RV enlargement/mild RV systolic dysfunction. Volume overloaded.  - Switched IV Lasix  bolus to IV Lasix  drip at 10 mg/h.  - She will need LHC and RHC on Monday.  - Was on Eliquis  today. Discontinued. Started heparin  drip/per pharmacy consult.   She needs to be transferred to [facility] on hospitalist service, heart failure team will follow      AKI Lab Results  Component Value Date   CREATININE 1.46 (H) 11/17/2023   CREATININE 1.34 (H) 11/16/2023   CREATININE 1.25 (H) 09/30/2023  BUN 25, 28, GFR 42, 38, -Monitor closely on Lasix    COPD/asthma -  Appears to be stable and currently no signs of acute exacerbation - Continue bronchodilator management.   Type 2 diabetes with nephropathy - Update A1c - Checking CBG q. ACHS, SSI coverage   Chronic kidney disease stage IIIb  - With a slightly increase in patient's creatinine from recent values most likely in the setting of decreased perfusion with CHF exacerbation - Continue diuresis - Follow-up renal function and urine output. - Minimize nephrotoxic agents and the use of contrast.   Hypertension - Stable on Lasix  drip, Entresto  - Continue current antihypertensive agent -Holding home medication spironolactone - Follow blood pressure trend.   GERD - Continue PPI    Hypothyroidism - TSH: 6.73 -Checking free T3, T4 - Continue Synthroid .   Depression/anxiety/neuropathy and Fibromyalgia - Continue Cymbalta  and Atarax  - Continue Lyrica  and Robaxin  - Also resuming as needed tramadol  per patient's home regimen.   History of DVT and prior CVA - No acute neurologic deficit - Continue Eliquis  for secondary prevention    Hyperlipidemia - Resume outpatient Repatha  after discharge - Heart healthy diet discussed with patient.      ----------------------------------------------------------------------------------------------------------------------------------------------- Nutritional status:  The patient's BMI is: Body mass index is 43.01 kg/m. I agree with the assessment and plan as outlined  Skin Assessment: I have examined the patient's skin and I agree with the wound assessment as performed by wound care team As outlined belowe:    ----------------------------------------------------------------------------------------------------  DVT prophylaxis:     Code Status:   Code Status: Full Code  Family Communication: No family member present at bedside-  -Advance care planning has been discussed.   Admission status:   Status is: Inpatient Remains inpatient appropriate because: Needing IV diuretics, respiratory support   Disposition: From  - home             Planning for discharge in 1-2 days   Procedures:   No admission procedures for hospital encounter.   Antimicrobials:  Anti-infectives (From admission, onward)    None        Medication:   arformoterol  15 mcg Nebulization BID   And   umeclidinium bromide  1 puff Inhalation Daily   vitamin C   500 mg Oral Daily   atorvastatin   80 mg Oral Daily   bisoprolol   10 mg Oral Daily   cycloSPORINE  1 drop Both Eyes BID   DULoxetine   60 mg Oral BID   insulin  aspart  0-15 Units Subcutaneous TID WC   insulin  aspart  0-5 Units Subcutaneous QHS   insulin  glargine  8 Units  Subcutaneous QHS   levothyroxine   100 mcg Oral QAC breakfast   loratadine   10 mg Oral Daily   pantoprazole   40 mg Oral Daily   pregabalin   75 mg Oral BID   sacubitril-valsartan  1 tablet Oral BID   traZODone   150 mg Oral QHS   ursodiol  300 mg Oral BID    acetaminophen  **OR** acetaminophen , hydrOXYzine , ipratropium-albuterol , methocarbamol , ondansetron  **OR** ondansetron  (ZOFRAN ) IV, traMADol    Objective:   Vitals:   11/17/23 0433 11/17/23 0804 11/17/23 0810 11/17/23 0829  BP: 110/68   119/69  Pulse: 82   82  Resp: 16     Temp: 97.7 F (36.5 C)   (!) 97.5 F (36.4 C)  TempSrc: Oral   Oral  SpO2: 96% 93% 96% 100%  Weight: 128.3 kg     Height:        Intake/Output Summary (Last 24 hours) at 11/17/2023 1149 Last data filed at 11/17/2023 1121 Gross  per 24 hour  Intake 360 ml  Output 1100 ml  Net -740 ml   Filed Weights   11/16/23 1118 11/16/23 2100 11/17/23 0433  Weight: 130.5 kg 130.5 kg 128.3 kg     Physical examination:   General:  AAO x 3,  cooperative, no distress;   HEENT:  Normocephalic, PERRL, otherwise with in Normal limits   Neuro:  CNII-XII intact. , normal motor and sensation, reflexes intact   Lungs:   Clear to auscultation BL, Respirations unlabored,  No wheezes / crackles  Cardio:    S1/S2, RRR, No murmure, No Rubs or Gallops   Abdomen:  Soft, non-tender, bowel sounds active all four quadrants, no guarding or peritoneal signs.  Muscular  skeletal:  Limited exam -global generalized weaknesses - in bed, able to move all 4 extremities,   2+ pulses,  symmetric, + 2 pitting edema  Skin:  Dry, warm to touch, negative for any Rashes,  Wounds: Please see nursing documentation    ---------------------------------------------------------------------------------------------------    LABs:     Latest Ref Rng & Units 11/16/2023   12:01 PM 09/30/2023    9:10 AM 09/07/2023   10:41 AM  CBC  WBC 4.0 - 10.5 K/uL 6.7  6.9  9.1   Hemoglobin 12.0 - 15.0 g/dL  85.7  87.7  87.1   Hematocrit 36.0 - 46.0 % 44.5  38.6  40.2   Platelets 150 - 400 K/uL 240  199  234       Latest Ref Rng & Units 11/17/2023    4:27 AM 11/16/2023   12:01 PM 09/30/2023    9:10 AM  CMP  Glucose 70 - 99 mg/dL 831  840  850   BUN 8 - 23 mg/dL 28  25  23    Creatinine 0.44 - 1.00 mg/dL 8.53  8.65  8.74   Sodium 135 - 145 mmol/L 132  130  131   Potassium 3.5 - 5.1 mmol/L 5.0  4.5  3.6   Chloride 98 - 111 mmol/L 98  95  97   CO2 22 - 32 mmol/L 25  21  25    Calcium  8.9 - 10.3 mg/dL 8.9  9.6  8.4   Total Protein 6.5 - 8.1 g/dL  9.1  7.7   Total Bilirubin 0.0 - 1.2 mg/dL  3.1  2.3   Alkaline Phos 38 - 126 U/L  410  233   AST 15 - 41 U/L  39  22   ALT 0 - 44 U/L  30  16        Micro Results No results found for this or any previous visit (from the past 240 hours).  Radiology Reports ECHOCARDIOGRAM COMPLETE Result Date: 11/17/2023    ECHOCARDIOGRAM REPORT   Patient Name:   Stephanie [name] Date of Exam: 11/17/2023 Medical Rec #:  [id number]   Height:       68.0 in Accession #:    [id number]  Weight:       282.8 lb Date of Birth:  1950-03-01   BSA:          2.368 m Patient Age:    73 years    BP:           110/68 mmHg Patient Gender: F           HR:           73 bpm. Exam [facility] Procedure: 2D Echo, 3D Echo, Cardiac Doppler and Color Doppler (Both Spectral  and Color Flow Doppler were utilized during procedure). Indications:    I50.40* Unspecified combined systolic (congestive) and diastolic                 (congestive) heart failure  History:        Patient has prior history of Echocardiogram examinations, most                 recent 05/24/2023. CHF, Abnormal ECG, COPD, TIA and Stroke,                 Arrythmias:Atrial Fibrillation, Signs/Symptoms:Dyspnea,                 Shortness of Breath, Edema and Chest Pain; Risk                 Factors:Hypertension, Diabetes and Former Smoker.  Sonographer:    Ellouise Mose RDCS Referring Phys: [phone] [facility]  Sonographer  Comments: Technically difficult study due to poor echo windows and patient is obese. Image acquisition challenging due to patient body habitus. IMPRESSIONS  1. Left ventricular ejection fraction, by estimation, is 35 to 40%. Left ventricular ejection fraction by 3D volume is 33 %. The left ventricle has moderately decreased function. The left ventricle demonstrates regional wall motion abnormalities (see scoring diagram/findings for description). The left ventricular internal cavity size was mildly dilated. Left ventricular diastolic parameters are indeterminate.  2. Right ventricular systolic function is mildly reduced. The right ventricular size is severely enlarged. Moderately increased right ventricular wall thickness. There is normal pulmonary artery systolic pressure. The estimated right ventricular systolic pressure is 31.8 mmHg.  3. Large pleural effusion.  4. The mitral valve is normal in structure. Mild mitral valve regurgitation. No evidence of mitral stenosis.  5. The aortic valve is tricuspid. Aortic valve regurgitation is not visualized. No aortic stenosis is present.  6. The inferior vena cava is dilated in size with >50% respiratory variability, suggesting right atrial pressure of 8 mmHg. FINDINGS  Left Ventricle: Left ventricular ejection fraction, by estimation, is 35 to 40%. Left ventricular ejection fraction by 3D volume is 33 %. The left ventricle has moderately decreased function. The left ventricle demonstrates regional wall motion abnormalities. The left ventricular internal cavity size was mildly dilated. There is no left ventricular hypertrophy. Left ventricular diastolic parameters are indeterminate.  LV Wall Scoring: The anterior septum, mid inferoseptal segment, and basal inferoseptal segment are akinetic. The entire inferior wall and posterior wall are hypokinetic. The entire anterior wall, antero-lateral wall, apical lateral segment, apical septal segment, and apex are normal. Right  Ventricle: The right ventricular size is severely enlarged. Moderately increased right ventricular wall thickness. Right ventricular systolic function is mildly reduced. There is normal pulmonary artery systolic pressure. The tricuspid regurgitant velocity is 2.44 m/s, and with an assumed right atrial pressure of 8 mmHg, the estimated right ventricular systolic pressure is 31.8 mmHg. Left Atrium: Left atrial size was normal in size. Right Atrium: Right atrial size was normal in size. Pericardium: Trivial pericardial effusion is present. Mitral Valve: The mitral valve is normal in structure. Mild mitral valve regurgitation. No evidence of mitral valve stenosis. Tricuspid Valve: The tricuspid valve is normal in structure. Tricuspid valve regurgitation is mild . No evidence of tricuspid stenosis. Aortic Valve: The aortic valve is tricuspid. Aortic valve regurgitation is not visualized. No aortic stenosis is present. Pulmonic Valve: The pulmonic valve was not well visualized. Pulmonic valve regurgitation is not visualized. No evidence of pulmonic stenosis. Aorta: The aortic  root and ascending aorta are structurally normal, with no evidence of dilitation. Venous: The inferior vena cava is dilated in size with greater than 50% respiratory variability, suggesting right atrial pressure of 8 mmHg. IAS/Shunts: No atrial level shunt detected by color flow Doppler. Additional Comments: 3D was performed not requiring image post processing on an independent workstation and was abnormal. There is a large pleural effusion.  LEFT VENTRICLE PLAX 2D LVIDd:         4.70 cm         Diastology LVIDs:         3.80 cm         LV e' medial:    4.79 cm/s LV PW:         1.00 cm         LV E/e' medial:  18.3 LV IVS:        0.90 cm         LV e' lateral:   7.29 cm/s LVOT diam:     1.80 cm         LV E/e' lateral: 12.0 LV SV:         48 LV SV Index:   20 LVOT Area:     2.54 cm        3D Volume EF                                LV 3D EF:    Left                                              ventricul LV Volumes (MOD)                            ar LV vol d, MOD    99.2 ml                    ejection A2C:                                        fraction LV vol d, MOD    105.0 ml                   by 3D A4C:                                        volume is LV vol s, MOD    61.6 ml                    33 %. A2C: LV vol s, MOD    57.0 ml A4C:                           3D Volume EF: LV SV MOD A2C:   37.6 ml       3D EF:        33 % LV SV MOD A4C:   105.0 ml      LV EDV:       118 ml LV  SV MOD BP:    43.8 ml       LV ESV:       80 ml                                LV SV:        39 ml RIGHT VENTRICLE            IVC RV S prime:     9.14 cm/s  IVC diam: 2.30 cm TAPSE (M-mode): 1.9 cm LEFT ATRIUM             Index        RIGHT ATRIUM           Index LA diam:        3.70 cm 1.56 cm/m   RA Area:     18.60 cm LA Vol (A2C):   45.3 ml 19.13 ml/m  RA Volume:   64.30 ml  27.15 ml/m LA Vol (A4C):   24.9 ml 10.51 ml/m LA Biplane Vol: 34.1 ml 14.40 ml/m  AORTIC VALVE LVOT Vmax:   94.20 cm/s LVOT Vmean:  62.800 cm/s LVOT VTI:    0.189 m  AORTA Ao Root diam: 2.90 cm Ao Asc diam:  3.10 cm MITRAL VALVE               TRICUSPID VALVE MV Area (PHT): 4.49 cm    TR Peak grad:   23.8 mmHg MV Decel Time: 169 msec    TR Vmax:        244.00 cm/s MV E velocity: 87.50 cm/s MV A velocity: 97.20 cm/s  SHUNTS MV E/A ratio:  0.90        Systemic VTI:  0.19 m                            Systemic Diam: 1.80 cm Vishnu Priya Mallipeddi Electronically signed by Diannah Late Mallipeddi Signature Date/Time: 11/17/2023/11:16:08 AM    Final     SIGNED: Adriana DELENA Grams, MD, FHM. FAAFP. Jolynn Pack - [facility] Time spent - 55 min.  In seeing, evaluating and examining the patient. Reviewing medical records, labs, drawn plan of care. [facility],  Pager (please use [email or web address] to page/ text) Please use Epic Secure Chat for non-urgent communication (7AM-7PM)  If 7PM-7AM, please contact  night-coverage [email or web address], 11/17/2023, 11:49 AM

## 2023-11-17 NOTE — Plan of Care (Signed)
   Problem: Education: Goal: Knowledge of General Education information will improve Description Including pain rating scale, medication(s)/side effects and non-pharmacologic comfort measures Outcome: Progressing   Problem: Education: Goal: Knowledge of General Education information will improve Description Including pain rating scale, medication(s)/side effects and non-pharmacologic comfort measures Outcome: Progressing

## 2023-11-17 NOTE — Progress Notes (Addendum)
 Nurse at bedside this am,patient alert to person,place,and situation,a little confused to time.Patient has generalized edema,patient's arm is more swollen on the left side,also sweeping noted to patient's skin. Patient did c/o some discomfort due to generalized swelling and tightness of her skin.Plan of care on going.

## 2023-11-17 NOTE — Plan of Care (Signed)
   Problem: Metabolic: Goal: Ability to maintain appropriate glucose levels will improve Outcome: Progressing   Problem: Tissue Perfusion: Goal: Adequacy of tissue perfusion will improve Outcome: Progressing   Problem: Health Behavior/Discharge Planning: Goal: Ability to manage health-related needs will improve Outcome: Progressing   Problem: Activity: Goal: Risk for activity intolerance will decrease Outcome: Progressing   Problem: Elimination: Goal: Will not experience complications related to urinary retention Outcome: Progressing   Problem: Pain Managment: Goal: General experience of comfort will improve and/or be controlled Outcome: Progressing   Problem: Safety: Goal: Ability to remain free from injury will improve Outcome: Progressing

## 2023-11-17 NOTE — Progress Notes (Addendum)
 Pt is getting transferred to [id number]. Report called in to [place], Charity fundraiser. Still awaiting for transport.   2015: [facility] arrived at bedside to transport  pt. Handoff given for Heparin  drip and Lasix  drip. Pts VS stable on 1L [place],  tele monitoring transferred to [facility] monitor and CCMD informed. Pts daughter, Winnifred, informed of pts transfer.

## 2023-11-17 NOTE — Progress Notes (Addendum)
 PHARMACY - ANTICOAGULATION CONSULT NOTE  Pharmacy Consult for heparin  Indication: hx DVT  Allergies  Allergen Reactions   Tetracyclines & Related Anaphylaxis and Rash   Banana Hives and Nausea And Vomiting   Jardiance  [Empagliflozin ]     Yeast infections, fatigue   Penicillins Rash and Other (See Comments)    Patient Measurements: Height: 5' 8 (172.7 cm) Weight: 128.3 kg (282 lb 13.6 oz) IBW/kg (Calculated) : 63.9 HEPARIN  DW (KG): 95.1  Vital Signs: Temp: 97.5 F (36.4 C) (10/01 0829) Temp Source: Oral (10/01 0829) BP: 119/69 (10/01 0829) Pulse Rate: 82 (10/01 0829)  Labs: Recent Labs    11/16/23 1201 11/17/23 0427  HGB 14.2  --   HCT 44.5  --   PLT 240  --   CREATININE 1.34* 1.46*    Estimated Creatinine Clearance: 48.6 mL/min (A) (by C-G formula based on SCr of 1.46 mg/dL (H)).   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   Unknown   albuterol  (VENTOLIN  HFA) 108 (90 Base) MCG/ACT inhaler Inhale 1-2 puffs into the lungs every 6 (six) hours as needed for wheezing or shortness of breath. 18 g 0 11/15/2023 Morning   Alcohol Swabs (ALCOHOL PADS) 70 % PADS SMARTSIG:Pledget(s) Topical 4 Times Daily    Unknown   apixaban  (ELIQUIS ) 5 MG TABS tablet Take 1 tablet (5 mg total) by mouth 2 (two) times daily.   11/15/2023 at 10:00 PM   Ascorbic Acid  (VITAMIN C ) 1000 MG tablet Take 500 mg by mouth daily.   Past Month   atorvastatin  (LIPITOR ) 80 MG tablet Take 1 tablet (80 mg total) by mouth daily. 30 tablet 0 11/15/2023 Evening   bisoprolol  (ZEBETA ) 10 MG tablet TAKE 1 TABLET(10 MG) BY MOUTH IN THE MORNING AND AT BEDTIME 180 tablet 1 11/15/2023 Bedtime   Cetirizine  HCl 10 MG CAPS Take 10 mg by mouth daily.   11/15/2023 Evening   Cholecalciferol  (VITAMIN D ) 2000 units tablet Take 4,000 Units by mouth daily.  (Patient taking differently: Take 2,000 Units by mouth daily.)   11/15/2023 Morning   cimetidine  (TAGAMET ) 200 MG tablet Take 0.5 tablets (100 mg total) by mouth daily as needed.   Unknown   CINNAMON PO Take 1-2 capsules by mouth 3 (three) times daily.   11/15/2023 Evening   clobetasol  ointment (TEMOVATE ) 0.05 % Apply 1 application  topically as needed (imflammation).   Unknown   DULoxetine  (CYMBALTA ) 60 MG capsule Take 60 mg by mouth 2 (two) times daily.   11/15/2023 Evening   Evolocumab  (REPATHA  SURECLICK) 140 MG/ML SOAJ Inject 140 mg into the skin every 14 (fourteen) days. 2 mL 3 Unknown   fluticasone  (FLONASE ) 50  MCG/ACT nasal spray SMARTSIG:2 Spray(s) Both Nares Daily PRN   Unknown   guaiFENesin  (ROBITUSSIN) 100 MG/5ML SOLN Take 5 mLs (100 mg total) by mouth every 4 (four) hours as needed for cough or to loosen phlegm. (Patient taking differently: Take 5 mLs by mouth as needed for cough or to loosen phlegm.) 236 mL 0 Unknown   hydrOXYzine  (ATARAX /VISTARIL ) 25 MG tablet Take 25 mg by mouth every 8 (eight) hours as needed for itching.    Unknown   insulin  degludec (TRESIBA  FLEXTOUCH) 100 UNIT/ML FlexTouch Pen Inject 15 Units into the skin at bedtime. 15 mL 3 11/15/2023 Bedtime   KLAYESTA  powder APPLY TOPICALLY THREE TIMES DAILY 15 g 0 Past Week   levothyroxine  (SYNTHROID ) 100 MCG tablet Take 1 tablet (100  mcg total) by mouth daily before breakfast. 90 tablet 1 11/15/2023 Morning   lidocaine  (LIDODERM ) 5 % Place 1 patch onto the skin daily. Remove & Discard patch within 12 hours or as directed by MD (Patient taking differently: Place 1 patch onto the skin daily as needed. Remove & Discard patch within 12 hours or as directed by MD) 30 patch 0 Unknown   lubiprostone  (AMITIZA ) 24 MCG capsule Take 1 capsule (24 mcg total) by mouth 2 (two) times daily with a meal. (Patient taking differently: Take 24 mcg by mouth 2 (two) times daily as needed for constipation.) 60 capsule 11 Unknown   LYRICA  75 MG capsule Take 75 mg by mouth 2 (two) times daily.    11/15/2023 Evening   methocarbamol  (ROBAXIN ) 500 MG tablet Take 1 tablet (500 mg total) by mouth 3 (three) times daily as needed. (Patient taking differently: Take 500 mg by mouth 3 (three) times daily as needed for muscle spasms.) 60 tablet 1 11/15/2023 Evening   pantoprazole  (PROTONIX ) 40 MG tablet Take 1 tablet (40 mg total) by mouth daily before breakfast. 90 tablet 3 11/15/2023 Morning   RESTASIS 0.05 % ophthalmic emulsion Place 1 drop into both eyes 2 (two) times daily.   11/15/2023 Evening   sacubitril-valsartan (ENTRESTO ) 24-26 MG Take 1 tablet by mouth 2 (two) times daily. 60 tablet 11 11/15/2023 Evening   Simethicone 125 MG CAPS Take 1 capsule every day by oral route as needed.   Unknown   spironolactone (ALDACTONE) 25 MG tablet Take 25 mg by mouth daily.   11/15/2023 Morning   Tiotropium Bromide-Olodaterol (STIOLTO RESPIMAT ) 2.5-2.5 MCG/ACT AERS Inhale 2 puffs into the lungs daily. 1 each  11/16/2023 Morning   tirzepatide  (MOUNJARO ) 12.5 MG/0.5ML Pen Inject 12.5 mg into the skin once a week. 6 mL 3 11/11/2023   torsemide  (DEMADEX ) 20 MG tablet Take 2 tablets (40 mg total) by mouth daily. (Patient taking differently: Take 80 mg by mouth daily.) 180 tablet 1 11/15/2023 Morning   traMADol  (ULTRAM ) 50 MG tablet Take 50 mg by mouth 3 (three) times daily as needed for  moderate pain.  1 11/15/2023 Evening   traZODone  (DESYREL ) 150 MG tablet Take 150 mg by mouth at bedtime.   11/15/2023 Bedtime   ursodiol (ACTIGALL) 300 MG capsule Take 300 mg by mouth 2 (two) times daily.   11/15/2023   Accu-Chek FastClix Lancets MISC Use 1 lancet to check blood glucose four (4) times daily as directed by provider. 102 each 5    ACCU-CHEK GUIDE test strip 4 (four) times daily.      ARNUITY ELLIPTA  100 MCG/ACT AEPB Inhale 1 puff into the lungs daily.      Blood Glucose Calibration (ACCU-CHEK GUIDE CONTROL) LIQD  See admin instructions.      Blood Glucose Monitoring Suppl (ACCU-CHEK GUIDE ME) w/Device KIT Use Glucose Meter to monitor blood glucose readings four(4) times daily as directed by provider. 1 kit 0    Continuous Glucose Receiver (FREESTYLE LIBRE 3 READER) DEVI USE TO MONITOR GLUCOSE CONTINUOUSLY AS DIRECTED 1 each 0    Continuous Glucose Sensor (FREESTYLE LIBRE 3 PLUS SENSOR) MISC USE TO MONITOR GLUCOSE CONTINUOUSLY AS DIRECTED. CHANGE SENSOR EVERY 15 DAYS. 6 each 1    EASY COMFORT PEN NEEDLES 31G X 5 MM MISC INJECT IN SULIN EVERY DAY AS DIRECTED      glucose blood (ACCU-CHEK GUIDE TEST) test strip Use 1 test strip four(4) times daily to monitor blood glucose as directed by provider. 200 each 5    Lancet Devices (EASY MINI EJECT LANCING DEVICE) MISC 4 (four) times daily.      Skin Protectants, Misc. (INTERDRY 10X144) SHEE Change pad daily or if soiled 1 each 0     Assessment: Pharmacy consulted to dose heparin  in patient with history of DVT.  Patient is on Eliquis  prior to admission with last dose 10/01 @ 0830.  Will need to dose based on aPTT until correlation with heparin  levels.  Goal of Therapy:  Heparin  level 0.3-0.7 units/ml aPTT 66-102 seconds Monitor platelets by anticoagulation protocol: Yes   Plan:  Start heparin  infusion at 1400 units/hr Check aPTT in 6-8 hours and aPTT/heparin  levels daily. Continue to monitor H&H and platelets.   Elspeth Sour,  PharmD Clinical Pharmacist 11/17/2023 12:02 PM

## 2023-11-17 NOTE — Progress Notes (Incomplete)
 Heart Failure Stewardship Pharmacy Note  PCP: [facility]oni, [facility] PCP-Cardiologist: Jayson Sierras, MD  HPI: Stephanie [name] is a 73 y.o. female with *** who presented with ***.   Pertinent cardiac history:  Pertinent Lab Values: Creat  Date Value Ref Range Status  02/11/2023 0.95 0.60 - 1.00 mg/dL Final   Creatinine, Ser  Date Value Ref Range Status  11/17/2023 1.46 (H) 0.44 - 1.00 mg/dL Final   BUN  Date Value Ref Range Status  11/17/2023 28 (H) 8 - 23 mg/dL Final   Potassium  Date Value Ref Range Status  11/17/2023 5.0 3.5 - 5.1 mmol/L Final   Sodium  Date Value Ref Range Status  11/17/2023 132 (L) 135 - 145 mmol/L Final   Brain Natriuretic Peptide  Date Value Ref Range Status  02/11/2023 1,213 (H) <100 pg/mL Final    Comment:    . BNP levels increase with age in the general population with the highest values seen in individuals greater than 68 years of age. Reference: J. Am. Penne. Cardiol. 2002; 59:023-017. .    Magnesium   Date Value Ref Range Status  11/16/2023 1.6 (L) 1.7 - 2.4 mg/dL Final    Comment:    Performed at [facility], [address], [place], [place] [address]   Hemoglobin A1C  Date Value Ref Range Status  09/09/2021 8.8  Final   Hgb A1c MFr Bld  Date Value Ref Range Status  11/16/2023 6.7 (H) 4.8 - 5.6 % Final    Comment:    (NOTE) Diagnosis of Diabetes The following HbA1c ranges recommended by the American Diabetes Association (ADA) may be used as an aid in the diagnosis of diabetes mellitus.  Hemoglobin             Suggested A1C NGSP%              Diagnosis  <5.7                   Non Diabetic  5.7-6.4                Pre-Diabetic  >6.4                   Diabetic  <7.0                   Glycemic control for                       adults with diabetes.     TSH  Date Value Ref Range Status  11/16/2023 6.730 (H) 0.350 - 4.500 uIU/mL Final    Comment:    Performed at [facility], [address] [place], [place]  [address]  10/21/2023 4.54 (H) 0.40 - 4.50 mIU/L Final   LDH  Date Value Ref Range Status  04/15/2023 207 (H) 98 - 192 U/L Final    Comment:    Performed at [facility], [address] [place], [place] [address]    Vital Signs: Admission weight: Temp:  [97.5 F (36.4 C)-97.7 F (36.5 C)] 97.7 F (36.5 C) (10/01 0433) Pulse Rate:  [79-86] 82 (10/01 0433) Cardiac Rhythm: Normal sinus rhythm (09/30 2129) Resp:  [10-16] 16 (10/01 0433) BP: (105-148)/(62-104) 110/68 (10/01 0433) SpO2:  [96 %-100 %] 96 % (10/01 0433) Weight:  [128.3 kg (282 lb 13.6 oz)-130.5 kg (287 lb 11.2 oz)] 128.3 kg (282 lb 13.6 oz) (10/01 0433)  Intake/Output Summary (Last 24 hours) at 11/17/2023 0747  Last data filed at 11/16/2023 2130 Gross per 24 hour  Intake 360 ml  Output 500 ml  Net -140 ml    Current Heart Failure Medications:  Loop diuretic: Beta-Blocker: ACEI/ARB/ARNI: MRA: SGLT2i: Other:  Prior to admission Heart Failure Medications:  Loop diuretic: Beta-Blocker: ACEI/ARB/ARNI: MRA: SGLT2i: Other:  Assessment: 1. {CHL AMB Acute or Chronic:210917265}  - Plan: 1) Medication changes recommended at this time:  2) Patient assistance:   3) Education: -To be completed prior to discharge.  *** Medication Assistance / Insurance Benefits Check: Does the patient have prescription insurance?    Type of insurance plan:  Does the patient qualify for medication assistance through manufacturers or grants? {CHL AMB Yes/No/Pending:[id number]}  Eligible grants and/or patient assistance programs: ***  Medication assistance applications in progress: ***  Medication assistance applications approved: *** Approved medication assistance renewals will be completed by: ***  Outpatient Pharmacy: Prior to admission outpatient pharmacy: ***      ***

## 2023-11-17 NOTE — TOC Initial Note (Signed)
 Transition of Care [facility]) - Initial/Assessment Note    Patient Details  Name: Stephanie [name] MRN: [id number] Date of Birth: 1950-06-15  [facility]) CM/SW Contact:    Mcarthur Saddie Kim, LCSW Phone Number: 11/17/2023, 8:02 AM  Clinical Narrative: Pt admitted due to acute on chronic CHF. TOC received consult for CHF screening. Pt reports her daughter lives with her and assists with ADLs as needed. Pt typically ambulates with walker. She is on 2-3L home O2 ([employer]). Daughter provides transportation to appointments. CHF screening completed. Pt indicates she was diagnosed with CHF many years ago. She weighs herself daily and takes medications as prescribed. When asked if she follows a heart health diet, pt responded, I think so. CHF education added to AVS. TOC will follow.                   Expected Discharge Plan: Home/Self Care Barriers to Discharge: Continued Medical Work up   Patient Goals and CMS Choice Patient states their goals for this hospitalization and ongoing recovery are:: return home   Choice offered to / list presented to : Patient [place] ownership interest in [facility].provided to::  (n/a)    Expected Discharge Plan and Services In-house Referral: Clinical Social Work     Living arrangements for the past 2 months: Single Family Home                                      Prior Living Arrangements/Services Living arrangements for the past 2 months: Single Family Home Lives with:: Adult Children Patient language and need for interpreter reviewed:: Yes Do you feel safe going back to the place where you live?: Yes      Need for Family Participation in Patient Care: No (Comment) Care giver support system in place?: Yes (comment) Current home services: DME (cane, walker, O2) Criminal Activity/Legal Involvement Pertinent to Current Situation/Hospitalization: No - Comment as needed  Activities of Daily Living   ADL Screening  (condition at time of admission) Independently performs ADLs?: Yes (appropriate for developmental age) Is the patient deaf or have difficulty hearing?: Yes Does the patient have difficulty seeing, even when wearing glasses/contacts?: Yes Does the patient have difficulty concentrating, remembering, or making decisions?: No  Permission Sought/Granted                  Emotional Assessment     Affect (typically observed): Appropriate Orientation: : Oriented to Place, Oriented to Self, Oriented to  Time, Oriented to Situation Alcohol / Substance Use: Not Applicable Psych Involvement: No (comment)  Admission diagnosis:  Heart failure, systolic, acute on chronic (HCC) [I50.23] Acute congestive heart failure, unspecified heart failure type (HCC) [I50.9] Patient Active Problem List   Diagnosis Date Noted   Heart failure, systolic, acute on chronic (HCC) 11/16/2023   COVID-19 01/09/2023   Right-sided chest pain 01/08/2023   Elevated troponin 01/08/2023   Chronic respiratory failure with hypoxia (HCC) 01/08/2023   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   Anxiety 12/23/2021   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) 11/25/2021   Hyperlipidemia 11/25/2021   Anemia due to chronic kidney disease 11/25/2021   Bilateral primary osteoarthritis of knee 09/15/2021   Acute on chronic combined systolic and diastolic CHF (congestive heart failure) (HCC) 05/20/2021   At risk for obstructive sleep apnea 05/20/2021   History of stroke 05/17/2021   Protein-calorie malnutrition,  moderate 05/17/2021   Cerebral infarction, unspecified (HCC) 05/17/2021   NASH (nonalcoholic steatohepatitis) 01/21/2021   Fibromyalgia 07/14/2020   [place] (dyspnea on exertion) 06/18/2020   Chronic pain syndrome 04/10/2020   Acute on chronic diastolic CHF (congestive heart failure) (HCC)    Lab test positive for detection of COVID-19 virus 03/17/2020   Pneumonia due to COVID-19 virus  03/11/2020   Acute respiratory disease due to COVID-19 virus 03/11/2020   Long term (current) use of anticoagulants 08/16/2018   Abnormal LFTs 02/28/2018   TIA (transient ischemic attack) 09/15/2017   Localized osteoarthritis of right shoulder 08/08/2017   Acute embolism and thrombosis of unspecified deep veins of lower extremity, bilateral (HCC) 03/16/2017   Anemia of chronic renal failure 03/16/2017   Atrial fibrillation (HCC) 03/16/2017   Chronic kidney disease, stage 3 unspecified (HCC) 03/16/2017   Metabolic acidosis 03/16/2017   Dysphagia 12/16/2015   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   Hyperglycemia due to type 2 diabetes mellitus (HCC) 12/09/2015   History of thromboembolism 11/08/2015   Constipation 10/27/2015   Diarrhea 08/02/2015   Elevated alkaline phosphatase level 07/23/2015   Heme positive stool 07/23/2015   Alkaline phosphatase raised 07/23/2015   Hypoglycemia 07/01/2015   Deep vein thrombosis (DVT) of both lower extremities (HCC) 06/27/2015   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   Maculopapular rash, generalized 06/27/2015   Alopecia 06/27/2015   Hypothyroidism 06/27/2015   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) 06/27/2015   Diverticulitis 10/19/2013   Former cigarette smoker 10/19/2013   Obesity 10/19/2013   Diabetes (HCC) 10/19/2013   Hypertension    Anemia    GERD (gastroesophageal reflux disease)    COPD (chronic obstructive pulmonary disease) (HCC)    PCP:  Pllc, [facility] Pharmacy:   [id number] - [place], [place] - [address] [place] [address] Phone: [phone] Fax: [phone]  [facility] [address], [place] [address] [place] [address] [place] [address] [place] [address] Phone: [phone] Fax: [phone]     Social Drivers of Health (SDOH) Social History: SDOH Screenings   Food  Insecurity: No Food Insecurity (11/16/2023)  Housing: Unknown (11/16/2023)  Transportation Needs: No Transportation Needs (11/16/2023)  Utilities: Not At Risk (11/16/2023)  Depression (PHQ2-9): Low Risk  (09/14/2023)  Social Connections: Moderately Isolated (11/16/2023)  Tobacco Use: Medium Risk (11/16/2023)   SDOH Interventions:     Readmission Risk Interventions     No data to display

## 2023-11-18 ENCOUNTER — Other Ambulatory Visit: Payer: Self-pay

## 2023-11-18 DIAGNOSIS — I509 Heart failure, unspecified: Secondary | ICD-10-CM | POA: Diagnosis not present

## 2023-11-18 DIAGNOSIS — I5023 Acute on chronic systolic (congestive) heart failure: Secondary | ICD-10-CM | POA: Diagnosis not present

## 2023-11-18 LAB — BASIC METABOLIC PANEL WITH GFR
Anion gap: 7 (ref 5–15)
Anion gap: 7 (ref 5–15)
BUN: 27 mg/dL — ABNORMAL HIGH (ref 8–23)
BUN: 28 mg/dL — ABNORMAL HIGH (ref 8–23)
CO2: 26 mmol/L (ref 22–32)
CO2: 26 mmol/L (ref 22–32)
Calcium: 8.5 mg/dL — ABNORMAL LOW (ref 8.9–10.3)
Calcium: 8.7 mg/dL — ABNORMAL LOW (ref 8.9–10.3)
Chloride: 98 mmol/L (ref 98–111)
Chloride: 99 mmol/L (ref 98–111)
Creatinine, Ser: 1.54 mg/dL — ABNORMAL HIGH (ref 0.44–1.00)
Creatinine, Ser: 1.58 mg/dL — ABNORMAL HIGH (ref 0.44–1.00)
GFR, Estimated: 34 mL/min — ABNORMAL LOW (ref >60)
GFR, Estimated: 35 mL/min — ABNORMAL LOW (ref >60)
Glucose, Bld: 143 mg/dL — ABNORMAL HIGH (ref 70–99)
Glucose, Bld: 145 mg/dL — ABNORMAL HIGH (ref 70–99)
Potassium: 3.9 mmol/L (ref 3.5–5.1)
Potassium: 3.9 mmol/L (ref 3.5–5.1)
Sodium: 131 mmol/L — ABNORMAL LOW (ref 135–145)
Sodium: 132 mmol/L — ABNORMAL LOW (ref 135–145)

## 2023-11-18 LAB — CBC
HCT: 35.9 % — ABNORMAL LOW (ref 36.0–46.0)
Hemoglobin: 11.3 g/dL — ABNORMAL LOW (ref 12.0–15.0)
MCH: 26.8 pg (ref 26.0–34.0)
MCHC: 31.5 g/dL (ref 30.0–36.0)
MCV: 85.1 fL (ref 80.0–100.0)
Platelets: 178 K/uL (ref 150–400)
RBC: 4.22 MIL/uL (ref 3.87–5.11)
RDW: 15.5 % (ref 11.5–15.5)
WBC: 7.4 K/uL (ref 4.0–10.5)
nRBC: 0 % (ref 0.0–0.2)

## 2023-11-18 LAB — GLUCOSE, CAPILLARY
Glucose-Capillary: 207 mg/dL — ABNORMAL HIGH (ref 70–99)
Glucose-Capillary: 184 mg/dL — ABNORMAL HIGH (ref 70–99)
Glucose-Capillary: 156 mg/dL — ABNORMAL HIGH (ref 70–99)
Glucose-Capillary: 146 mg/dL — ABNORMAL HIGH (ref 70–99)

## 2023-11-18 LAB — LIPID PANEL
Cholesterol: 113 mg/dL (ref 0–200)
HDL: 41 mg/dL (ref >40)
LDL Cholesterol: 64 mg/dL (ref 0–99)
Total CHOL/HDL Ratio: 2.8 ratio
Triglycerides: 42 mg/dL (ref <150)
VLDL: 8 mg/dL (ref 0–40)

## 2023-11-18 LAB — APTT
aPTT: 150 s — ABNORMAL HIGH (ref 24–36)
aPTT: 158 s — ABNORMAL HIGH (ref 24–36)

## 2023-11-18 LAB — BRAIN NATRIURETIC PEPTIDE: B Natriuretic Peptide: 2302.1 pg/mL — ABNORMAL HIGH (ref 0.0–100.0)

## 2023-11-18 LAB — MAGNESIUM
Magnesium: 1.6 mg/dL — ABNORMAL LOW (ref 1.7–2.4)
Magnesium: 1.7 mg/dL (ref 1.7–2.4)

## 2023-11-18 LAB — HEPARIN LEVEL (UNFRACTIONATED): Heparin Unfractionated: >1.1 [IU]/mL — ABNORMAL HIGH (ref 0.30–0.70)

## 2023-11-18 LAB — T3, FREE: T3, Free: 1.6 pg/mL — ABNORMAL LOW (ref 2.0–4.4)

## 2023-11-18 MED ORDER — SODIUM CHLORIDE 0.9% FLUSH: 10.0000 mL | INTRAVENOUS | Status: DC | PRN

## 2023-11-18 MED ORDER — TRAMADOL HCL 50 MG PO TABS
25.0000 mg | ORAL_TABLET | Freq: Once | ORAL | Status: AC
  Administered 2023-11-18: 25 mg via ORAL
  Filled 2023-11-18: qty 1

## 2023-11-18 MED ORDER — HEPARIN (PORCINE) 25000 UT/250ML-% IV SOLN
850.0000 [IU]/h | INTRAVENOUS | Status: DC
  Administered 2023-11-19 – 2023-11-20 (×2): 1050 [IU]/h via INTRAVENOUS
  Administered 2023-11-21: 900 [IU]/h via INTRAVENOUS
  Administered 2023-11-23: 850 [IU]/h via INTRAVENOUS
  Administered 2023-11-24: 700 [IU]/h via INTRAVENOUS
  Administered 2023-11-25: 900 [IU]/h via INTRAVENOUS
  Filled 2023-11-18 (×6): qty 250

## 2023-11-18 MED ORDER — HYDROMORPHONE HCL 1 MG/ML IJ SOLN
0.5000 mg | INTRAMUSCULAR | Status: DC | PRN
  Administered 2023-11-18 – 2023-11-27 (×13): 0.5 mg via INTRAVENOUS
  Filled 2023-11-18 (×13): qty 1

## 2023-11-18 MED ORDER — CHLORHEXIDINE GLUCONATE CLOTH 2 % EX PADS
6.0000 | MEDICATED_PAD | Freq: Every day | CUTANEOUS | Status: DC
  Administered 2023-11-19 – 2023-12-05 (×17): 6 via TOPICAL

## 2023-11-18 MED ORDER — SODIUM CHLORIDE 0.9% FLUSH
10.0000 mL | Freq: Two times a day (BID) | INTRAVENOUS | Status: DC
  Administered 2023-11-18 – 2023-12-04 (×21): 10 mL
  Administered 2023-12-04: 20 mL
  Administered 2023-12-05 – 2023-12-06 (×3): 10 mL

## 2023-11-18 NOTE — Progress Notes (Addendum)
  Progress Note  Patient Name: Stephanie [name] Date of Encounter: 11/18/2023 [facility] Cardiologist: Jayson Sierras, MD   Interval Summary   Transfer from [facility] Resting in bed, complaining of 10/10 generalized pain She has history of fibromyalgia but she said the meds she is getting are not helping Having good urine output with IV Lasix  drip Hopeful to undergo R/LHC on Friday   Vital Signs Vitals:   11/18/23 0300 11/18/23 0330 11/18/23 0400 11/18/23 0800  BP: (!) 145/81 129/87 139/78   Pulse: 82 83 84   Resp: 16 15 (!) 8 20  Temp:   97.8 F (36.6 C) 97.7 F (36.5 C)  TempSrc:   Oral Oral  SpO2: 98% 100% 97%   Weight:   123 kg   Height:        Intake/Output Summary (Last 24 hours) at 11/18/2023 0848 Last data filed at 11/18/2023 0725 Gross per 24 hour  Intake 847.8 ml  Output 3025 ml  Net -2177.2 ml      11/18/2023    4:00 AM 11/17/2023    4:33 AM 11/16/2023    9:00 PM  Last 3 Weights  Weight (lbs) 271 lb 2.7 oz 282 lb 13.6 oz 287 lb 11.2 oz  Weight (kg) 123 kg 128.3 kg 130.5 kg     Telemetry/ECG  Sinus rhythm, HR 80s, frequent PACs - Personally Reviewed  Physical Exam  GEN: No acute distress.   Neck: difficult to assess JVD given body habitus  Cardiac: RRR, no murmurs Respiratory: decreased breath sounds. GI: firm, nontender MS: 2+ pitting LE edema   Assessment & Plan  73 y.o. 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-55% by echo in 12/2022, 40% by echo in 05/2023, now 35-40%), sinus tachycardia, HTN, HLD, Type 2 DM, IBS, history of DVT (on chronic anticoagulation with Eliquis ), history of CVA, MGUS, COPD and Stage 3 CKD   Acute on chronic HFrEF Presented to [facility] with worsening [place], orthopnea, LE edema x 1 month Reports 50 lb weight gain since May 2025 Echo this admission: LVEF 35-40%, RWMA, severe RV enlargement, mild RV dysfunction proBNP 14,798  Baseline creatinine 1.2-1.3 Continue Entresto  24-26 mg BID Continue bisoprolol  10 mg  daily  MRA was held in setting of hyperkalemia, K was 5 yesterday, would wait for improvement before re-starting  Intolerant to SGL2Ti due to fatigue and frequent yeast infections Started on IV Lasix  drip 10 mg/hr at [facility] Transferred to [facility] for [facility], will likely be completed Friday after volume status improves   Informed Consent   Shared Decision Making/Informed Consent The risks [stroke (1 in 1000), death (1 in 1000), kidney failure [usually temporary] (1 in 500), bleeding (1 in 200), allergic reaction [possibly serious] (1 in 200)], benefits (diagnostic support and management of coronary artery disease) and alternatives of a cardiac catheterization were discussed in detail with Ms. Brinley and she is willing to proceed.     Hypertension BP fairly controlled when at [facility] Medications as above   Hyperlipidemia  06/04/2023: HDL 60; LDL Cholesterol 122 11/16/2023: ALT 30  Continue Lipitor  80 mg daily  Per primary History of DVT Prior CVA CKD Hypothyroidism OSA Fibromylagia  GERD COPD, asthma Diabetes    For questions or updates, please contact [facility] Please consult [email or web address] for contact info under         Signed, Waddell DELENA Donath, PA-C

## 2023-11-18 NOTE — Progress Notes (Signed)
 Peripherally Inserted Central Catheter Placement  The IV Nurse has discussed with the patient and/or persons authorized to consent for the patient, the purpose of this procedure and the potential benefits and risks involved with this procedure.  The benefits include less needle sticks, lab draws from the catheter, and the patient may be discharged home with the catheter. Risks include, but not limited to, infection, bleeding, blood clot (thrombus formation), and puncture of an artery; nerve damage and irregular heartbeat and possibility to perform a PICC exchange if needed/ordered by physician.  Alternatives to this procedure were also discussed.  Bard Power PICC patient education guide, fact sheet on infection prevention and patient information card has been provided to patient /or left at bedside.    PICC Placement Documentation  PICC Double Lumen 11/18/23 Right Basilic 41 cm 0 cm (Active)  Indication for Insertion or Continuance of Line Vasoactive infusions 11/18/23 1659  Exposed Catheter (cm) 0 cm 11/18/23 1659  Site Assessment Clean, Dry, Intact 11/18/23 1659  Lumen #1 Status Flushed;Saline locked;Blood return noted 11/18/23 1659  Lumen #2 Status Flushed;Saline locked;Blood return noted 11/18/23 1659  Dressing Type Transparent;Securing device 11/18/23 1659  Dressing Status Antimicrobial disc/dressing in place;Clean, Dry, Intact 11/18/23 1659  Line Care Connections checked and tightened 11/18/23 1659  Line Adjustment (NICU/IV Team Only) No 11/18/23 1659  Dressing Intervention New dressing;Adhesive placed at insertion site (IV team only) 11/18/23 1659  Dressing Change Due 11/25/23 11/18/23 1659       Leita  Florrie Ramires 11/18/2023, 5:02 PM

## 2023-11-18 NOTE — Progress Notes (Addendum)
 PHARMACY - ANTICOAGULATION CONSULT NOTE  Pharmacy Consult for heparin  Indication: hx DVT  Allergies  Allergen Reactions   Tetracyclines & Related Anaphylaxis and Rash   Banana Hives and Nausea And Vomiting   Jardiance  [Empagliflozin ]     Yeast infections, fatigue   Penicillins Rash and Other (See Comments)    Patient Measurements: Height: 5' 8 (172.7 cm) Weight: 123 kg (271 lb 2.7 oz) IBW/kg (Calculated) : 63.9 HEPARIN  DW (KG): 95.1  Vital Signs: Temp: 97.6 F (36.4 C) (10/02 1500) Temp Source: Oral (10/02 1500) BP: 108/64 (10/02 1500) Pulse Rate: 72 (10/02 1500)  Labs: Recent Labs    11/16/23 1201 11/17/23 0427 11/18/23 0730 11/18/23 1745  HGB 14.2  --   --  11.3*  HCT 44.5  --   --  35.9*  PLT 240  --   --  178  APTT  --   --   --  150*  HEPARINUNFRC  --   --  >1.10*  --   CREATININE 1.34* 1.46*  --  1.58*  1.54*    Estimated Creatinine Clearance: 44.9 mL/min (A) (by C-G formula based on SCr of 1.54 mg/dL (H)).   Assessment: Pharmacy consulted to dose heparin  in patient with history of DVT.  Patient is on Eliquis  prior to admission with last dose 10/01 at 0830.  Will need to dose based on aPTT until correlation with heparin  levels.  aPTT supra-therapeutic at 150 sec.  Spoke to RN, lab was collected appropriately after pausing the heparin , flushing the line and discarding the initial sample; however, location of PICC/lab draw is proximal to the pIV where heparin  is infusing, so there is still a possibility of contamination.  No bleeding reported.  Goal of Therapy:  Heparin  level 0.3-0.7 units/ml aPTT 66-102 seconds Monitor platelets by anticoagulation protocol: Yes   Plan:  Continue heparin  infusion at 1400 units/hr Stat repeat aPTT  Alcario Tinkey D. Lendell, PharmD, BCPS, BCCCP 11/18/2023, 8:22 PM  ====================  Addendum: Repeat aPTT elevated at 158 sec Asked RN to hold heparin  for 1 hr, then resume at 1050 units/hr Check 8 hr aPTT  Dezirae Service D. Lendell,  PharmD, BCPS, BCCCP 11/18/2023, 9:55 PM

## 2023-11-18 NOTE — Plan of Care (Signed)
   Problem: Education: Goal: Ability to describe self-care measures that may prevent or decrease complications (Diabetes Survival Skills Education) will improve Outcome: Progressing Goal: Individualized Educational Video(s) Outcome: Progressing   Problem: Fluid Volume: Goal: Ability to maintain a balanced intake and output will improve Outcome: Progressing

## 2023-11-18 NOTE — Progress Notes (Signed)
   There was an order for PICC placement from AM 10/2 as they were unable to draw labs. Reached out to vascular team who acknowledged that the patient is on their list for PICC placement. As the Lasix  drip has been running for some time, without an ability to check updated labs, discussed with MD and we will briefly discontinue Lasix  drip until we are able to get labs to check electrolytes before continuing with diuresis.RN informed.  Waddell DELENA Donath, PA-C 11/18/2023 2:50 PM

## 2023-11-18 NOTE — Progress Notes (Signed)
 Progress Note   Patient: Stephanie [name] FMW:[id number] DOB: [birth year] DOA: 11/16/2023     2 DOS: the patient was seen and examined on 11/18/2023   Brief hospital course: 73 y.o. female with medical history significant of systolic heart failure, hypothyroidism, hyperlipidemia, COPD/asthma, history of DVT, fibromyalgia, GERD and type 2 diabetes mellitus with nephropathy; who presented to the hospital secondary to increased lower extremity swelling, shortness of breath with activity/orthopnea and weight gain.  Patient reports symptom has been present for the last 3 to 4 days and worsening; she was scheduled to be seen by cardiology service as an outpatient but unfortunately due to worsening of her symptoms did not make it and presented to the emergency department instead.     Of note, patient reports to be compliant with her medications.   ED:  Demonstrating elevated BNP, signs of fluid overload on physical exam and a chest x-ray with vascular congestion/interstitial edema.  IV Lasix  was started  Assessment and Plan: Acute on chronic systolic heart failure - Hemodynamically stable, satting 100% on 1 L of oxygen  -Repeat 2d echo with EF 35-40% with regional WMA, mildly reduced RV function and severely enlarged RV size  - h/o EJF around 40% on previous echo. - Presenting with complaint of weight gain, orthopnea, lower extremity swelling and elevated BNP -presenting BNP 14,798.0 - Cardiology following, cont IV lasix   - Continue Entresto , Bisoprolol ,   -cont heparin  gtt. Plan LHC/RHC monday   COPD/asthma - Appears to be stable and currently no signs of acute exacerbation - Continue bronchodilator management.   Type 2 diabetes with nephropathy - Update A1c - Checking CBG q. ACHS, SSI coverage   Acute on Chronic kidney disease stage IIIb  - With a slightly increase in patient's creatinine from recent values most likely in the setting of decreased perfusion with CHF exacerbation -pt continues to  void well   Hypertension - Stable on Lasix  drip, Entresto  - Continue current antihypertensive agent -Holding home medication spironolactone   GERD - Continue PPI   Hypothyroidism - TSH: 6.73 -Checking free T3, T4 - Continue Synthroid .   Depression/anxiety/neuropathy and Fibromyalgia - Continue Cymbalta  and Atarax  - Continue Lyrica  and Robaxin  - Complained of increased pain this AM. Added PRN IV dilaudid  for breakthrough    History of DVT and prior CVA - No acute neurologic deficit - Continue heparin  gtt for now     Hyperlipidemia - Resume outpatient Repatha  after discharge   Subjective: Without complaints when seen this AM  Physical Exam: Vitals:   11/18/23 0330 11/18/23 0400 11/18/23 0800 11/18/23 1107  BP: 129/87 139/78  122/61  Pulse: 83 84    Resp: 15 (!) 8 20 18   Temp:  97.8 F (36.6 C) 97.7 F (36.5 C) (!) 97.5 F (36.4 C)  TempSrc:  Oral Oral Oral  SpO2: 100% 97%    Weight:  123 kg    Height:       General exam: Awake, laying in bed, in nad Respiratory system: Normal respiratory effort, no wheezing Cardiovascular system: regular rate, s1, s2 Gastrointestinal system: Soft, nondistended, positive BS Central nervous system: CN2-12 grossly intact, strength intact Extremities: Perfused, no clubbing Skin: Normal skin turgor, no notable skin lesions seen Psychiatry: Mood normal // no visual hallucinations   Data Reviewed:  There are no new results to review at this time.  Family Communication: Pt in room, family at bedside  Disposition: Status is: Inpatient Remains inpatient appropriate because: severity of illness  Planned Discharge Destination: Home  Author: Garnette Pelt, MD 11/18/2023 3:49 PM  For on call review [email or web address].

## 2023-11-18 NOTE — Evaluation (Signed)
 Physical Therapy Evaluation Patient Details Name: Stephanie [name] MRN: [id number] DOB: [birth year] Today's Date: 11/18/2023  History of Present Illness  73 y.o. female who presented to the hospital 9/30 secondary to increased lower extremity swelling, shortness of breath with activity/orthopnea and weight gain. Dx acute on chronic systolic heart failure. with medical history significant of systolic heart failure, hypothyroidism, hyperlipidemia, COPD/asthma, history of DVT, fibromyalgia, GERD and type 2 diabetes mellitus with nephropathy.   Clinical Impression  Pt admitted with above diagnosis. Previously ambulatory with a cane and intermittent use of RW. Needed assist with ADLs from family PRN. Required mod assist to stand from elevated bed surface, and twice from recliner today. Min assist for step pivot transfer to recline and back to bed (IV team coming for picc placement - per nurse.) Patient will benefit from continued inpatient follow up therapy, <3 hours/day.  Pt currently with functional limitations due to the deficits listed below (see PT Problem List). Pt will benefit from acute skilled PT to increase their independence and safety with mobility to allow discharge.           If plan is discharge home, recommend the following: A lot of help with bathing/dressing/bathroom;Assistance with cooking/housework;Assist for transportation;Help with stairs or ramp for entrance;Two people to help with walking and/or transfers   Can travel by private vehicle   No    Equipment Recommendations Alden lift;Hospital bed  Recommendations for Other Services       Functional Status Assessment Patient has had a recent decline in their functional status and demonstrates the ability to make significant improvements in function in a reasonable and predictable amount of time.     Precautions / Restrictions Precautions Precautions: Fall Recall of Precautions/Restrictions: Intact Restrictions Weight Bearing  Restrictions Per Provider Order: No      Mobility  Bed Mobility Overal bed mobility: Needs Assistance Bed Mobility: Supine to Sit, Sit to Supine     Supine to sit: Mod assist, HOB elevated, Used rails Sit to supine: Mod assist   General bed mobility comments: Mod assist for trunk support to rise, cues for rail use as needed. Mod assist for LE support back into bed. Able to scoot herself up with UEs on rails and bed in trendelenburg.    Transfers Overall transfer level: Needs assistance Equipment used: Rolling walker (2 wheels) Transfers: Sit to/from Stand, Bed to chair/wheelchair/BSC Sit to Stand: Mod assist, From elevated surface   Step pivot transfers: Min assist       General transfer comment: Mod assist for boost to stand, once from elevated bed surface and twice from recliner. Requires considerable time to set-up and align prior to rising. Once 75% of the way up, pt LEs engage well and stabilize with RW for support. Min assist for step pivot transfer with assist for RW placement. Performed from bed to recliner and back to bed again.    Ambulation/Gait               General Gait Details: Deferred for safety, need +2  Stairs            Wheelchair Mobility     Tilt Bed    Modified Rankin (Stroke Patients Only)       Balance Overall balance assessment: Needs assistance Sitting-balance support: Feet supported, Bilateral upper extremity supported Sitting balance-Leahy Scale: Poor   Postural control: Posterior lean Standing balance support: Bilateral upper extremity supported, Reliant on assistive device for balance Standing balance-Leahy Scale: Poor  Pertinent Vitals/Pain Pain Assessment Pain Assessment: No/denies pain    Home Living Family/patient expects to be discharged to:: Private residence Living Arrangements: Children Available Help at Discharge: Family;Available PRN/intermittently Type of Home:  House Home Access: Stairs to enter Entrance Stairs-Rails: None Entrance Stairs-Number of Steps: 1   Home Layout: One level Home Equipment: Agricultural consultant (2 wheels);Cane - single point;BSC/3in1;Wheelchair - manual      Prior Function Prior Level of Function : Needs assist;History of Falls (last six months)             Mobility Comments: Using SPC prior to decline, occasional use of RW. + falls ADLs Comments: Daughter helps with bath/dress.     Extremity/Trunk Assessment   Upper Extremity Assessment Upper Extremity Assessment: Defer to OT evaluation    Lower Extremity Assessment Lower Extremity Assessment: Generalized weakness (edematous)       Communication   Communication Communication: No apparent difficulties    Cognition Arousal: Alert Behavior During Therapy: WFL for tasks assessed/performed   PT - Cognitive impairments: No apparent impairments                         Following commands: Intact       Cueing Cueing Techniques: Verbal cues, Gestural cues     General Comments General comments (skin integrity, edema, etc.): Poor wave form on O2 monitor; appears to desat to mid 80s on RA, and stays in upper 90s on 2L. Moderate dyspnea with minimal exertion.    Exercises General Exercises - Lower Extremity Ankle Circles/Pumps: AROM, Both, 10 reps, Seated Quad Sets: Strengthening, Both, 10 reps, Seated Gluteal Sets: Strengthening, Both, 10 reps, Seated   Assessment/Plan    PT Assessment Patient needs continued PT services  PT Problem List Decreased strength;Decreased range of motion;Decreased activity tolerance;Decreased balance;Decreased mobility;Decreased knowledge of use of DME;Cardiopulmonary status limiting activity;Obesity;Decreased skin integrity       PT Treatment Interventions DME instruction;Gait training;Functional mobility training;Therapeutic activities;Therapeutic exercise;Balance training;Neuromuscular re-education;Patient/family  education;Wheelchair mobility training    PT Goals (Current goals can be found in the Care Plan section)  Acute Rehab PT Goals Patient Stated Goal: Get well PT Goal Formulation: With patient Time For Goal Achievement: 12/02/23 Potential to Achieve Goals: Good    Frequency Min 2X/week     Co-evaluation               AM-PAC PT 6 Clicks Mobility  Outcome Measure Help needed turning from your back to your side while in a flat bed without using bedrails?: A Little Help needed moving from lying on your back to sitting on the side of a flat bed without using bedrails?: A Lot Help needed moving to and from a bed to a chair (including a wheelchair)?: A Lot Help needed standing up from a chair using your arms (e.g., wheelchair or bedside chair)?: A Lot Help needed to walk in hospital room?: Total Help needed climbing 3-5 steps with a railing? : Total 6 Click Score: 11    End of Session Equipment Utilized During Treatment: Gait belt;Oxygen  Activity Tolerance: Patient tolerated treatment well;Patient limited by fatigue Patient left: in bed;with call bell/phone within reach;with bed alarm set (RN request back to bed for picc line)   PT Visit Diagnosis: Unsteadiness on feet (R26.81);Other abnormalities of gait and mobility (R26.89);Muscle weakness (generalized) (M62.81);Difficulty in walking, not elsewhere classified (R26.2)    Time: 8451-8366 PT Time Calculation (min) (ACUTE ONLY): 45 min   Charges:   PT Evaluation $  PT Eval Moderate Complexity: 1 Mod PT Treatments $Therapeutic Activity: 23-37 mins PT General Charges $$ ACUTE PT VISIT: 1 Visit         Leontine Roads, PT, DPT [facility]  Re[facility] Services Physical Therapist Office: [phone] Website: [email or web address]   Leontine GORMAN Roads 11/18/2023, 4:46 PM

## 2023-11-19 DIAGNOSIS — I5023 Acute on chronic systolic (congestive) heart failure: Secondary | ICD-10-CM | POA: Diagnosis not present

## 2023-11-19 LAB — BASIC METABOLIC PANEL WITH GFR
Anion gap: 8 (ref 5–15)
Anion gap: 8 (ref 5–15)
BUN: 27 mg/dL — ABNORMAL HIGH (ref 8–23)
BUN: 26 mg/dL — ABNORMAL HIGH (ref 8–23)
CO2: 28 mmol/L (ref 22–32)
CO2: 24 mmol/L (ref 22–32)
Calcium: 9.1 mg/dL (ref 8.9–10.3)
Calcium: 8.6 mg/dL — ABNORMAL LOW (ref 8.9–10.3)
Chloride: 97 mmol/L — ABNORMAL LOW (ref 98–111)
Chloride: 98 mmol/L (ref 98–111)
Creatinine, Ser: 1.5 mg/dL — ABNORMAL HIGH (ref 0.44–1.00)
Creatinine, Ser: 1.48 mg/dL — ABNORMAL HIGH (ref 0.44–1.00)
GFR, Estimated: 37 mL/min — ABNORMAL LOW (ref >60)
GFR, Estimated: 37 mL/min — ABNORMAL LOW (ref >60)
Glucose, Bld: 138 mg/dL — ABNORMAL HIGH (ref 70–99)
Glucose, Bld: 138 mg/dL — ABNORMAL HIGH (ref 70–99)
Potassium: 3.8 mmol/L (ref 3.5–5.1)
Potassium: 4.7 mmol/L (ref 3.5–5.1)
Sodium: 133 mmol/L — ABNORMAL LOW (ref 135–145)
Sodium: 130 mmol/L — ABNORMAL LOW (ref 135–145)

## 2023-11-19 LAB — GLUCOSE, CAPILLARY
Glucose-Capillary: 123 mg/dL — ABNORMAL HIGH (ref 70–99)
Glucose-Capillary: 140 mg/dL — ABNORMAL HIGH (ref 70–99)
Glucose-Capillary: 145 mg/dL — ABNORMAL HIGH (ref 70–99)
Glucose-Capillary: 221 mg/dL — ABNORMAL HIGH (ref 70–99)

## 2023-11-19 LAB — HEPARIN LEVEL (UNFRACTIONATED): Heparin Unfractionated: 0.99 [IU]/mL — ABNORMAL HIGH (ref 0.30–0.70)

## 2023-11-19 LAB — CBC
HCT: 38.5 % (ref 36.0–46.0)
Hemoglobin: 12.3 g/dL (ref 12.0–15.0)
MCH: 27.1 pg (ref 26.0–34.0)
MCHC: 31.9 g/dL (ref 30.0–36.0)
MCV: 84.8 fL (ref 80.0–100.0)
Platelets: 194 K/uL (ref 150–400)
RBC: 4.54 MIL/uL (ref 3.87–5.11)
RDW: 15.7 % — ABNORMAL HIGH (ref 11.5–15.5)
WBC: 6.9 K/uL (ref 4.0–10.5)
nRBC: 0 % (ref 0.0–0.2)

## 2023-11-19 LAB — LIPOPROTEIN A (LPA): Lipoprotein (a): 181.5 nmol/L — ABNORMAL HIGH (ref <75.0)

## 2023-11-19 LAB — APTT: aPTT: 80 s — ABNORMAL HIGH (ref 24–36)

## 2023-11-19 LAB — MAGNESIUM: Magnesium: 1.6 mg/dL — ABNORMAL LOW (ref 1.7–2.4)

## 2023-11-19 MED ORDER — FUROSEMIDE 10 MG/ML IJ SOLN
10.0000 mg/h | INTRAVENOUS | Status: DC
  Filled 2023-11-19: qty 20

## 2023-11-19 MED ORDER — MAGNESIUM SULFATE 2 GM/50ML IV SOLN
2.0000 g | Freq: Once | INTRAVENOUS | Status: AC
  Administered 2023-11-19: 2 g via INTRAVENOUS
  Filled 2023-11-19: qty 50

## 2023-11-19 MED ORDER — FUROSEMIDE 10 MG/ML IJ SOLN
120.0000 mg | Freq: Two times a day (BID) | INTRAVENOUS | Status: DC
  Administered 2023-11-19 – 2023-11-25 (×13): 120 mg via INTRAVENOUS
  Filled 2023-11-19: qty 10
  Filled 2023-11-19 (×2): qty 2
  Filled 2023-11-19 (×2): qty 10
  Filled 2023-11-19: qty 2
  Filled 2023-11-19 (×2): qty 12
  Filled 2023-11-19 (×2): qty 10
  Filled 2023-11-19 (×2): qty 12
  Filled 2023-11-19: qty 2
  Filled 2023-11-19 (×2): qty 10
  Filled 2023-11-19: qty 12
  Filled 2023-11-19: qty 10

## 2023-11-19 NOTE — Plan of Care (Signed)
  Problem: Education: Goal: Ability to describe self-care measures that may prevent or decrease complications (Diabetes Survival Skills Education) will improve Outcome: Progressing   Problem: Coping: Goal: Ability to adjust to condition or change in health will improve Outcome: Progressing   Problem: Fluid Volume: Goal: Ability to maintain a balanced intake and output will improve Outcome: Progressing   Problem: Nutritional: Goal: Maintenance of adequate nutrition will improve Outcome: Progressing   Problem: Skin Integrity: Goal: Risk for impaired skin integrity will decrease Outcome: Progressing   Problem: Education: Goal: Knowledge of General Education information will improve Description: Including pain rating scale, medication(s)/side effects and non-pharmacologic comfort measures Outcome: Progressing   Problem: Clinical Measurements: Goal: Ability to maintain clinical measurements within normal limits will improve Outcome: Progressing   Problem: Coping: Goal: Level of anxiety will decrease Outcome: Progressing   Problem: Pain Managment: Goal: General experience of comfort will improve and/or be controlled Outcome: Progressing

## 2023-11-19 NOTE — Progress Notes (Signed)
 Progress Note   Patient: Stephanie [name] FMW:[id number] DOB: 02/15/1951 DOA: 11/16/2023     3 DOS: the patient was seen and examined on 11/19/2023   Brief hospital course: 73 y.o. female with medical history significant of systolic heart failure, hypothyroidism, hyperlipidemia, COPD/asthma, history of DVT, fibromyalgia, GERD and type 2 diabetes mellitus with nephropathy; who presented to the hospital secondary to increased lower extremity swelling, shortness of breath with activity/orthopnea and weight gain.  Patient reports symptom has been present for the last 3 to 4 days and worsening; she was scheduled to be seen by cardiology service as an outpatient but unfortunately due to worsening of her symptoms did not make it and presented to the emergency department instead.     Of note, patient reports to be compliant with her medications.   ED:  Demonstrating elevated BNP, signs of fluid overload on physical exam and a chest x-ray with vascular congestion/interstitial edema.  IV Lasix  was started  Assessment and Plan: Acute on chronic systolic heart failure - Hemodynamically stable, satting 100% on 1 L of oxygen  -Repeat 2d echo with EF 35-40% with regional WMA, mildly reduced RV function and severely enlarged RV size  - h/o EJF around 40% on previous echo. - Presenting with complaint of weight gain, orthopnea, lower extremity swelling and elevated BNP -presenting BNP 14,798.0 - Cardiology following, IV lasix  now changed to IV boluses - Continue Entresto , Bisoprolol ,   -cont heparin  gtt. Plan LHC/RHC monday   COPD/asthma - Appears to be stable and currently no signs of acute exacerbation - Continue bronchodilator management.   Type 2 diabetes with nephropathy - Update A1c - Checking CBG q. ACHS, SSI coverage   Acute on Chronic kidney disease stage IIIb  - With a slightly increase in patient's creatinine from recent values most likely in the setting of decreased perfusion with CHF  exacerbation -pt continues to void well   Hypertension - Stable on Lasix  drip, Entresto  - Continue current antihypertensive agent -Holding home medication spironolactone   GERD - Continue PPI   Hypothyroidism - TSH: 6.73 -Checking free T3, T4 - Continue Synthroid .   Depression/anxiety/neuropathy and Fibromyalgia - Continue Cymbalta  and Atarax  - Continue Lyrica  and Robaxin  - Complained of increased pain this AM. Added PRN IV dilaudid  for breakthrough    History of DVT and prior CVA - No acute neurologic deficit - Continue heparin  gtt for now     Hyperlipidemia - Resume outpatient Repatha  after discharge   Subjective: No complaints this AM  Physical Exam: Vitals:   11/19/23 0747 11/19/23 0844 11/19/23 0847 11/19/23 1130  BP: 121/78   119/66  Pulse: 81   83  Resp: 16   16  Temp: (!) 97.4 F (36.3 C)   (!) 97.5 F (36.4 C)  TempSrc: Oral   Oral  SpO2: 96% 97% 100% 100%  Weight:      Height:       General exam: Conversant, in no acute distress Respiratory system: normal chest rise, clear, no audible wheezing Cardiovascular system: regular rhythm, s1-s2 Gastrointestinal system: Nondistended, nontender, pos BS Central nervous system: No seizures, no tremors Extremities: No cyanosis, no joint deformities Skin: No rashes, no pallor Psychiatry: Affect normal // no auditory hallucinations   Data Reviewed:  Labs reviewed: Na 133, K 3.8, Cr 1.50, Mg 1.6  Family Communication: Pt in room, family not at bedside  Disposition: Status is: Inpatient Remains inpatient appropriate because: severity of illness  Planned Discharge Destination: Home and Skilled nursing facility  Author: Garnette Pelt, MD 11/19/2023 4:25 PM  For on call review [email or web address].

## 2023-11-19 NOTE — Progress Notes (Signed)
 Paged Cardiology on call to verify order to restart IV Lasix . Received call back from Dr. Orlando (cardiology), order to restart IV lasix  confirmed. New order for Magnesium  IV x 1 received. Will administered as ordered and continue to monitor pt's response.

## 2023-11-19 NOTE — TOC PASRR Note (Signed)
 [facility] IP TOC PASRR NOTE  RE: Stephanie [name]  Date of Birth: [birth year]  Date: 11/19/2023    To Whom It May Concern:   Please be advised that the above-named patient will require a short-term nursing home stay - anticipated 30 days or less for rehabilitation and strengthening. The plan is for return home.

## 2023-11-19 NOTE — Progress Notes (Signed)
 Rounding Note   Patient Name: Stephanie [name] Date of Encounter: 11/19/2023  [facility] Cardiologist: Jayson Sierras, MD   Subjective  Pt states she is improving, but remains volume up. Found with HOB elevated.  Scheduled Meds:  arformoterol  15 mcg Nebulization BID   And   umeclidinium bromide  1 puff Inhalation Daily   vitamin C   500 mg Oral Daily   atorvastatin   80 mg Oral Daily   bisoprolol   10 mg Oral Daily   Chlorhexidine  Gluconate Cloth  6 each Topical Daily   cycloSPORINE  1 drop Both Eyes BID   DULoxetine   60 mg Oral BID   insulin  aspart  0-15 Units Subcutaneous TID WC   insulin  aspart  0-5 Units Subcutaneous QHS   insulin  glargine  8 Units Subcutaneous QHS   levothyroxine   100 mcg Oral QAC breakfast   loratadine   10 mg Oral Daily   pantoprazole   40 mg Oral Daily   pregabalin   75 mg Oral BID   sacubitril-valsartan  1 tablet Oral BID   sodium chloride  flush  10-40 mL Intracatheter Q12H   traZODone   150 mg Oral QHS   ursodiol  300 mg Oral BID   Continuous Infusions:  furosemide  (LASIX ) 200 mg in dextrose  5 % 100 mL (2 mg/mL) infusion 10 mg/hr (11/19/23 0119)   heparin  1,050 Units/hr (11/19/23 0530)   PRN Meds: acetaminophen  **OR** acetaminophen , HYDROmorphone  (DILAUDID ) injection, hydrOXYzine , ipratropium-albuterol , methocarbamol , ondansetron  **OR** ondansetron  (ZOFRAN ) IV, sodium chloride  flush, traMADol    Vital Signs  Vitals:   11/18/23 2023 11/18/23 2050 11/19/23 0032 11/19/23 0434  BP:  108/61 117/80 125/64  Pulse:  73 72 71  Resp:  18 14 15   Temp:  98.1 F (36.7 C) 98.4 F (36.9 C) 98.1 F (36.7 C)  TempSrc:  Oral Oral Oral  SpO2: 98% 100% 97% 98%  Weight:    121.7 kg  Height:        Intake/Output Summary (Last 24 hours) at 11/19/2023 0716 Last data filed at 11/18/2023 0725 Gross per 24 hour  Intake --  Output 800 ml  Net -800 ml      11/19/2023    4:34 AM 11/18/2023    4:00 AM 11/17/2023    4:33 AM  Last 3 Weights  Weight (lbs)  268 lb 4.8 oz 271 lb 2.7 oz 282 lb 13.6 oz  Weight (kg) 121.7 kg 123 kg 128.3 kg      Telemetry Sinus rhythm with PACs, HR 70s, low voltage p waves - Personally Reviewed  ECG  No new tracings - Personally Reviewed  Physical Exam  GEN: obese female in NAD   Neck: + JVD, but difficult exam Cardiac: irregular rhythm, regular rate, no murmur  Respiratory: diminished bilaterally GI: mildly distended, nontender  MS: at least 2+ B LE edema Neuro:  Nonfocal  Psych: Normal affect   Labs High Sensitivity Troponin:  No results for input(s): TROPONINIHS in the last 720 hours.   Chemistry Recent Labs  Lab 11/16/23 1201 11/16/23 1325 11/17/23 0427 11/18/23 1745  NA 130*  --  132* 132*  131*  K 4.5  --  5.0 3.9  3.9  CL 95*  --  98 99  98  CO2 21*  --  25 26  26   GLUCOSE 159*  --  168* 145*  143*  BUN 25*  --  28* 27*  28*  CREATININE 1.34*  --  1.46* 1.58*  1.54*  CALCIUM  9.6  --  8.9  8.7*  8.5*  MG  --  1.6*  --  1.6*  1.7  PROT 9.1*  --   --   --   ALBUMIN 3.3*  --   --   --   AST 39  --   --   --   ALT 30  --   --   --   ALKPHOS 410*  --   --   --   BILITOT 3.1*  --   --   --   GFRNONAA 42*  --  38* 34*  35*  ANIONGAP 14  --  9 7  7     Lipids  Recent Labs  Lab 11/18/23 1745  CHOL 113  TRIG 42  HDL 41  LDLCALC 64  CHOLHDL 2.8    Hematology Recent Labs  Lab 11/16/23 1201 11/18/23 1745  WBC 6.7 7.4  RBC 5.18* 4.22  HGB 14.2 11.3*  HCT 44.5 35.9*  MCV 85.9 85.1  MCH 27.4 26.8  MCHC 31.9 31.5  RDW 15.8* 15.5  PLT 240 178   Thyroid   Recent Labs  Lab 11/16/23 1335 11/17/23 0857  TSH 6.730*  --   FREET4  --  2.40*    BNP Recent Labs  Lab 11/16/23 1201 11/18/23 1745  BNP  --  2,302.1*  PROBNP 14,798.0*  --     DDimer No results for input(s): DDIMER in the last 168 hours.   Radiology  US  EKG SITE RITE Result Date: 11/18/2023 If Site Rite image not attached, placement could not be confirmed due to current cardiac  rhythm.  ECHOCARDIOGRAM COMPLETE Result Date: 11/17/2023    ECHOCARDIOGRAM REPORT   Patient Name:   PATICIA MOSTER Croucher Date of Exam: 11/17/2023 Medical Rec #:  [id number]   Height:       68.0 in Accession #:    [id number]  Weight:       282.8 lb Date of Birth:  19-Apr-1950   BSA:          2.368 m Patient Age:    73 years    BP:           110/68 mmHg Patient Gender: F           HR:           73 bpm. Exam [facility]:  [facility] Procedure: 2D Echo, 3D Echo, Cardiac Doppler and Color Doppler (Both Spectral            and Color Flow Doppler were utilized during procedure). Indications:    I50.40* Unspecified combined systolic (congestive) and diastolic                 (congestive) heart failure  History:        Patient has prior history of Echocardiogram examinations, most                 recent 05/24/2023. CHF, Abnormal ECG, COPD, TIA and Stroke,                 Arrythmias:Atrial Fibrillation, Signs/Symptoms:Dyspnea,                 Shortness of Breath, Edema and Chest Pain; Risk                 Factors:Hypertension, Diabetes and Former Smoker.  Sonographer:    Ellouise Mose RDCS Referring Phys: [phone] [facility]  Sonographer Comments: Technically difficult study due to poor echo windows and patient is obese. Image acquisition challenging due to  patient body habitus. IMPRESSIONS  1. Left ventricular ejection fraction, by estimation, is 35 to 40%. Left ventricular ejection fraction by 3D volume is 33 %. The left ventricle has moderately decreased function. The left ventricle demonstrates regional wall motion abnormalities (see scoring diagram/findings for description). The left ventricular internal cavity size was mildly dilated. Left ventricular diastolic parameters are indeterminate.  2. Right ventricular systolic function is mildly reduced. The right ventricular size is severely enlarged. Moderately increased right ventricular wall thickness. There is normal pulmonary artery systolic pressure. The estimated right ventricular  systolic pressure is 31.8 mmHg.  3. Large pleural effusion.  4. The mitral valve is normal in structure. Mild mitral valve regurgitation. No evidence of mitral stenosis.  5. The aortic valve is tricuspid. Aortic valve regurgitation is not visualized. No aortic stenosis is present.  6. The inferior vena cava is dilated in size with >50% respiratory variability, suggesting right atrial pressure of 8 mmHg. FINDINGS  Left Ventricle: Left ventricular ejection fraction, by estimation, is 35 to 40%. Left ventricular ejection fraction by 3D volume is 33 %. The left ventricle has moderately decreased function. The left ventricle demonstrates regional wall motion abnormalities. The left ventricular internal cavity size was mildly dilated. There is no left ventricular hypertrophy. Left ventricular diastolic parameters are indeterminate.  LV Wall Scoring: The anterior septum, mid inferoseptal segment, and basal inferoseptal segment are akinetic. The entire inferior wall and posterior wall are hypokinetic. The entire anterior wall, antero-lateral wall, apical lateral segment, apical septal segment, and apex are normal. Right Ventricle: The right ventricular size is severely enlarged. Moderately increased right ventricular wall thickness. Right ventricular systolic function is mildly reduced. There is normal pulmonary artery systolic pressure. The tricuspid regurgitant velocity is 2.44 m/s, and with an assumed right atrial pressure of 8 mmHg, the estimated right ventricular systolic pressure is 31.8 mmHg. Left Atrium: Left atrial size was normal in size. Right Atrium: Right atrial size was normal in size. Pericardium: Trivial pericardial effusion is present. Mitral Valve: The mitral valve is normal in structure. Mild mitral valve regurgitation. No evidence of mitral valve stenosis. Tricuspid Valve: The tricuspid valve is normal in structure. Tricuspid valve regurgitation is mild . No evidence of tricuspid stenosis. Aortic Valve:  The aortic valve is tricuspid. Aortic valve regurgitation is not visualized. No aortic stenosis is present. Pulmonic Valve: The pulmonic valve was not well visualized. Pulmonic valve regurgitation is not visualized. No evidence of pulmonic stenosis. Aorta: The aortic root and ascending aorta are structurally normal, with no evidence of dilitation. Venous: The inferior vena cava is dilated in size with greater than 50% respiratory variability, suggesting right atrial pressure of 8 mmHg. IAS/Shunts: No atrial level shunt detected by color flow Doppler. Additional Comments: 3D was performed not requiring image post processing on an independent workstation and was abnormal. There is a large pleural effusion.  LEFT VENTRICLE PLAX 2D LVIDd:         4.70 cm         Diastology LVIDs:         3.80 cm         LV e' medial:    4.79 cm/s LV PW:         1.00 cm         LV E/e' medial:  18.3 LV IVS:        0.90 cm         LV e' lateral:   7.29 cm/s LVOT diam:     1.80  cm         LV E/e' lateral: 12.0 LV SV:         48 LV SV Index:   20 LVOT Area:     2.54 cm        3D Volume EF                                LV 3D EF:    Left                                             ventricul LV Volumes (MOD)                            ar LV vol d, MOD    99.2 ml                    ejection A2C:                                        fraction LV vol d, MOD    105.0 ml                   by 3D A4C:                                        volume is LV vol s, MOD    61.6 ml                    33 %. A2C: LV vol s, MOD    57.0 ml A4C:                           3D Volume EF: LV SV MOD A2C:   37.6 ml       3D EF:        33 % LV SV MOD A4C:   105.0 ml      LV EDV:       118 ml LV SV MOD BP:    43.8 ml       LV ESV:       80 ml                                LV SV:        39 ml RIGHT VENTRICLE            IVC RV S prime:     9.14 cm/s  IVC diam: 2.30 cm TAPSE (M-mode): 1.9 cm LEFT ATRIUM             Index        RIGHT ATRIUM           Index LA diam:         3.70 cm 1.56 cm/m   RA Area:     18.60 cm LA Vol (A2C):   45.3 ml 19.13 ml/m  RA Volume:   64.30 ml  27.15 ml/m LA Vol (  A4C):   24.9 ml 10.51 ml/m LA Biplane Vol: 34.1 ml 14.40 ml/m  AORTIC VALVE LVOT Vmax:   94.20 cm/s LVOT Vmean:  62.800 cm/s LVOT VTI:    0.189 m  AORTA Ao Root diam: 2.90 cm Ao Asc diam:  3.10 cm MITRAL VALVE               TRICUSPID VALVE MV Area (PHT): 4.49 cm    TR Peak grad:   23.8 mmHg MV Decel Time: 169 msec    TR Vmax:        244.00 cm/s MV E velocity: 87.50 cm/s MV A velocity: 97.20 cm/s  SHUNTS MV E/A ratio:  0.90        Systemic VTI:  0.19 m                            Systemic Diam: 1.80 cm Vishnu Priya Mallipeddi Electronically signed by Diannah Late Mallipeddi Signature Date/Time: 11/17/2023/11:16:08 AM    Final     Cardiac Studies  Echo 11/17/23:  1. Left ventricular ejection fraction, by estimation, is 35 to 40%. Left  ventricular ejection fraction by 3D volume is 33 %. The left ventricle has  moderately decreased function. The left ventricle demonstrates regional  wall motion abnormalities (see  scoring diagram/findings for description). The left ventricular internal  cavity size was mildly dilated. Left ventricular diastolic parameters are  indeterminate.   2. Right ventricular systolic function is mildly reduced. The right  ventricular size is severely enlarged. Moderately increased right  ventricular wall thickness. There is normal pulmonary artery systolic  pressure. The estimated right ventricular  systolic pressure is 31.8 mmHg.   3. Large pleural effusion.   4. The mitral valve is normal in structure. Mild mitral valve  regurgitation. No evidence of mitral stenosis.   5. The aortic valve is tricuspid. Aortic valve regurgitation is not  visualized. No aortic stenosis is present.   6. The inferior vena cava is dilated in size with >50% respiratory  variability, suggesting right atrial pressure of 8 mmHg.   Patient Profile    73 y.o. female with  a hx of chronic HFrEF, HTN, HLD, DMII, IBS, history of DVT (on chronic anticoagulation with Eliquis ), history of CVA, MGUS, COPD and Stage 3 CKD who presented with the CC of lower extreme edema. Cardiology is following for evaluation of decompensated CHF.   Assessment & Plan   Acute on chronic systolic heart failure Anasarca  - prior LVEF 40% in 05/2023 - echo this admission with LVEF 35-40% with RWMA, mildly reduced RV function, severe RV enlargement with normal PASP, large pericardial effusion  - lasix  drip inititated, labs have been difficult to obtain given edema, now with PICC in place - BMP with stable creatinine - I&Os may not be complete - GDMT: no SGLT2i due to fatigue and frequent yeast infections, continue 10 mg bisoprolol , 24-26 mg entresto  - consider adding spironolactone prior to discharge - stop lasix  gtt currently running at 5 mg/hr, start 120 mg IV lasix  BID - plan tentatively for [facility] Monday if good diuresis over the weekend   DVT - holding home eliquis  pending likely heart cath on Monday with adequate diuresis - telemetry appears sinus rhythm with PACs, low voltage p wave    Hypertension - managed in the context of HFrEF   Hyperlipidemia with LDL goal < 70 11/18/2023: Cholesterol 113; HDL 41; LDL Cholesterol 64; Triglycerides 42; VLDL 8 Continue  statin - 80 mg lipitor         For questions or updates, please contact [facility] Please consult [email or web address] for contact info under       Signed, Jon Nat Hails, PA  11/19/2023, 7:16 AM

## 2023-11-19 NOTE — Plan of Care (Signed)

## 2023-11-19 NOTE — NC FL2 (Signed)
 [place]  MEDICAID FL2 LEVEL OF CARE FORM     IDENTIFICATION  Patient Name: Stephanie [name] Birthdate: Oct 22, 1950 Sex: female Admission Date (Current Location): 11/16/2023  [facility] and [place] Number:  Producer, television/film/video and Address:  [facility], [address], [place], [place] [address]      Provider Number: [id number]  Attending Physician Name and Address:  Cindy Garnette POUR, MD  Relative Name and Phone Number:  Camelia Stelzner (daughter) [phone]    Current Level of Care: Hospital Recommended Level of Care: Skilled Nursing Facility Prior Approval Number:    Date Approved/Denied:   PASRR Number: PASRR under review  Discharge Plan: SNF    Current Diagnoses: Patient Active Problem List   Diagnosis Date Noted   Heart failure, systolic, acute on chronic (HCC) 11/16/2023   COVID-19 01/09/2023   Right-sided chest pain 01/08/2023   Elevated troponin 01/08/2023   Chronic respiratory failure with hypoxia (HCC) 01/08/2023   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   Anxiety 12/23/2021   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) 11/25/2021   Hyperlipidemia 11/25/2021   Anemia due to chronic kidney disease 11/25/2021   Bilateral primary osteoarthritis of knee 09/15/2021   Acute on chronic combined systolic and diastolic CHF (congestive heart failure) (HCC) 05/20/2021   At risk for obstructive sleep apnea 05/20/2021   History of stroke 05/17/2021   Protein-calorie malnutrition, moderate 05/17/2021   Cerebral infarction, unspecified (HCC) 05/17/2021   NASH (nonalcoholic steatohepatitis) 01/21/2021   Fibromyalgia 07/14/2020   [place] (dyspnea on exertion) 06/18/2020   Chronic pain syndrome 04/10/2020   Acute on chronic diastolic CHF (congestive heart failure) (HCC)    Lab test positive for detection of COVID-19 virus 03/17/2020   Pneumonia due to COVID-19 virus 03/11/2020   Acute respiratory disease due to COVID-19 virus  03/11/2020   Long term (current) use of anticoagulants 08/16/2018   Abnormal LFTs 02/28/2018   TIA (transient ischemic attack) 09/15/2017   Localized osteoarthritis of right shoulder 08/08/2017   Acute embolism and thrombosis of unspecified deep veins of lower extremity, bilateral (HCC) 03/16/2017   Anemia of chronic renal failure 03/16/2017   Atrial fibrillation (HCC) 03/16/2017   Chronic kidney disease, stage 3 unspecified (HCC) 03/16/2017   Metabolic acidosis 03/16/2017   Dysphagia 12/16/2015   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   Hyperglycemia due to type 2 diabetes mellitus (HCC) 12/09/2015   History of thromboembolism 11/08/2015   Constipation 10/27/2015   Diarrhea 08/02/2015   Elevated alkaline phosphatase level 07/23/2015   Heme positive stool 07/23/2015   Alkaline phosphatase raised 07/23/2015   Hypoglycemia 07/01/2015   Deep vein thrombosis (DVT) of both lower extremities (HCC) 06/27/2015   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   Maculopapular rash, generalized 06/27/2015   Alopecia 06/27/2015   Hypothyroidism 06/27/2015   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) 06/27/2015   Diverticulitis 10/19/2013   Former cigarette smoker 10/19/2013   Obesity 10/19/2013   Diabetes (HCC) 10/19/2013   Hypertension    Anemia    GERD (gastroesophageal reflux disease)    COPD (chronic obstructive pulmonary disease) (HCC)     Orientation RESPIRATION BLADDER Height & Weight     Self, Time, Situation, Place  O2 (Nasal Cannula 2 liters) Continent, External catheter (External Urinary Catheter) Weight: 268 lb 4.8 oz (121.7 kg) Height:  5' 8 (172.7 cm)  BEHAVIORAL SYMPTOMS/MOOD NEUROLOGICAL BOWEL NUTRITION STATUS        Diet (Please see discharge summary)  AMBULATORY STATUS COMMUNICATION OF NEEDS Skin   Extensive Assist Verbally Other (Comment) (Abrasion,nose,upper,cleansed,Ecchymosis,arm,Bilateral,Wound/Incision LDAs)                        Personal Care Assistance Level of Assistance  Bathing, Feeding, Dressing Bathing Assistance: Maximum assistance Feeding assistance: Independent Dressing Assistance: Maximum assistance     Functional Limitations Info  Sight, Hearing, Speech Sight Info: Adequate Hearing Info: Adequate Speech Info: Adequate    SPECIAL CARE FACTORS FREQUENCY  PT (By licensed PT), OT (By licensed OT)     PT Frequency: 5x min weekly OT Frequency: 5x min weekly            Contractures Contractures Info: Not present    Additional Factors Info  Code Status, Allergies, Psychotropic, Insulin  Sliding Scale Code Status Info: FULL Allergies Info: Tetracyclines & Related,Banana,Jardiance  (empagliflozin ),Penicillins Psychotropic Info: DULoxetine  (CYMBALTA ) DR capsule 60 mg 2 times daily Insulin  Sliding Scale Info: insulin  aspart (novoLOG ) injection 0-15 Units 3 times daily with meals,  insulin  aspart (novoLOG ) injection 0-5 Units daily at bedtime,insulin  glargine (LANTUS ) injection 8 Units daily at bedtime,pregabalin  (LYRICA ) capsule 75 mg 2 times daily,traZODone  (DESYREL ) tablet 150 mg daily at bedtime       Current Medications (11/19/2023):  This is the current hospital active medication list Current Facility-Administered Medications  Medication Dose Route Frequency Provider Last Rate Last Admin   acetaminophen  (TYLENOL ) tablet 650 mg  650 mg Oral Q6H PRN Willette Jest A, MD   650 mg at 11/18/23 9175   Or   acetaminophen  (TYLENOL ) suppository 650 mg  650 mg Rectal Q6H PRN Shahmehdi, Jest LABOR, MD       arformoterol (BROVANA) nebulizer solution 15 mcg  15 mcg Nebulization BID Shahmehdi, Seyed A, MD   15 mcg at 11/19/23 0844   And   umeclidinium bromide (INCRUSE ELLIPTA) 62.5 MCG/ACT 1 puff  1 puff Inhalation Daily Shahmehdi, Seyed A, MD   1 puff at 11/19/23 0846   ascorbic acid  (VITAMIN C ) tablet 500 mg  500 mg Oral Daily Shahmehdi, Seyed A, MD   500 mg at 11/19/23 1026   atorvastatin   (LIPITOR ) tablet 80 mg  80 mg Oral Daily Shahmehdi, Seyed A, MD   80 mg at 11/19/23 1026   bisoprolol  (ZEBETA ) tablet 10 mg  10 mg Oral Daily Shahmehdi, Seyed A, MD   10 mg at 11/19/23 1026   Chlorhexidine  Gluconate Cloth 2 % PADS 6 each  6 each Topical Daily Cindy Garnette POUR, MD   6 each at 11/19/23 1032   cycloSPORINE (RESTASIS) 0.05 % ophthalmic emulsion 1 drop  1 drop Both Eyes BID Shahmehdi, Seyed A, MD   1 drop at 11/19/23 1026   DULoxetine  (CYMBALTA ) DR capsule 60 mg  60 mg Oral BID Shahmehdi, Jest A, MD   60 mg at 11/19/23 1026   furosemide  (LASIX ) 120 mg in dextrose  5 % 50 mL IVPB  120 mg Intravenous BID Madie Jon Garre, [place] 62 mL/hr at 11/19/23 0923 120 mg at 11/19/23 0923   heparin  ADULT infusion 100 units/mL (25000 units/250mL)  1,050 Units/hr Intravenous Continuous Lendell Serum D, RPH 10.5 mL/hr at 11/19/23 0530 1,050 Units/hr at 11/19/23 0530   HYDROmorphone  (DILAUDID ) injection 0.5 mg  0.5 mg Intravenous Q4H PRN Cindy Garnette POUR, MD   0.5 mg at 11/18/23 1037   hydrOXYzine  (ATARAX ) tablet 25 mg  25 mg Oral Q8H PRN Willette Jest A, MD   25 mg at 11/18/23 0824   insulin  aspart (  novoLOG ) injection 0-15 Units  0-15 Units Subcutaneous TID WC Shahmehdi, Seyed A, MD   2 Units at 11/19/23 1216   insulin  aspart (novoLOG ) injection 0-5 Units  0-5 Units Subcutaneous QHS Shahmehdi, Seyed A, MD       insulin  glargine (LANTUS ) injection 8 Units  8 Units Subcutaneous QHS Shahmehdi, Seyed A, MD   8 Units at 11/18/23 2217   ipratropium-albuterol  (DUONEB) 0.5-2.5 (3) MG/3ML nebulizer solution 3 mL  3 mL Nebulization Q6H PRN Shahmehdi, Seyed A, MD       levothyroxine  (SYNTHROID ) tablet 100 mcg  100 mcg Oral QAC breakfast Shahmehdi, Seyed A, MD   100 mcg at 11/19/23 0631   loratadine  (CLARITIN ) tablet 10 mg  10 mg Oral Daily Shahmehdi, Seyed A, MD   10 mg at 11/19/23 1027   methocarbamol  (ROBAXIN ) tablet 500 mg  500 mg Oral TID PRN Willette Jest A, MD   500 mg at 11/18/23 9175   ondansetron  (ZOFRAN )  tablet 4 mg  4 mg Oral Q6H PRN Shahmehdi, Seyed A, MD       Or   ondansetron  (ZOFRAN ) injection 4 mg  4 mg Intravenous Q6H PRN Shahmehdi, Seyed A, MD       pantoprazole  (PROTONIX ) EC tablet 40 mg  40 mg Oral Daily Shahmehdi, Seyed A, MD   40 mg at 11/19/23 1026   pregabalin  (LYRICA ) capsule 75 mg  75 mg Oral BID Shahmehdi, Seyed A, MD   75 mg at 11/19/23 1027   sacubitril-valsartan (ENTRESTO ) 24-26 mg per tablet  1 tablet Oral BID Willette Jest A, MD   1 tablet at 11/19/23 1027   sodium chloride  flush (NS) 0.9 % injection 10-40 mL  10-40 mL Intracatheter Q12H Cindy Garnette POUR, MD   10 mL at 11/19/23 0857   sodium chloride  flush (NS) 0.9 % injection 10-40 mL  10-40 mL Intracatheter PRN Cindy Garnette POUR, MD       traMADol  (ULTRAM ) tablet 50 mg  50 mg Oral Q8H PRN Willette Jest A, MD   50 mg at 11/18/23 0434   traZODone  (DESYREL ) tablet 150 mg  150 mg Oral QHS Shahmehdi, Seyed A, MD   150 mg at 11/18/23 2047   ursodiol (ACTIGALL) capsule 300 mg  300 mg Oral BID Shahmehdi, Seyed A, MD   300 mg at 11/19/23 1026     Discharge Medications: Please see discharge summary for a list of discharge medications.  Relevant Imaging Results:  Relevant Lab Results:   Additional Information [id number]  Isaiah Public, LCSWA

## 2023-11-19 NOTE — Progress Notes (Signed)
 PHARMACY - ANTICOAGULATION CONSULT NOTE  Pharmacy Consult for heparin  Indication: hx DVT  Allergies  Allergen Reactions   Tetracyclines & Related Anaphylaxis and Rash   Banana Hives and Nausea And Vomiting   Jardiance  [Empagliflozin ]     Yeast infections, fatigue   Penicillins Rash and Other (See Comments)    Patient Measurements: Height: 5' 8 (172.7 cm) Weight: 121.7 kg (268 lb 4.8 oz) IBW/kg (Calculated) : 63.9 HEPARIN  DW (KG): 95.1  Vital Signs: Temp: 97.4 F (36.3 C) (10/03 0747) Temp Source: Oral (10/03 0747) BP: 121/78 (10/03 0747) Pulse Rate: 81 (10/03 0747)  Labs: Recent Labs    11/16/23 1201 11/17/23 0427 11/18/23 0730 11/18/23 1745 11/18/23 2036 11/19/23 0712 11/19/23 0714  HGB 14.2  --   --  11.3*  --  12.3  --   HCT 44.5  --   --  35.9*  --  38.5  --   PLT 240  --   --  178  --  194  --   APTT  --   --   --  150* 158*  --  80*  HEPARINUNFRC  --   --  >1.10*  --   --   --  0.99*  CREATININE 1.34* 1.46*  --  1.58*  1.54*  --  1.50*  --     Estimated Creatinine Clearance: 45.9 mL/min (A) (by C-G formula based on SCr of 1.5 mg/dL (H)).   Assessment: Pharmacy consulted to dose heparin  in patient with history of DVT.  Patient is on Eliquis  prior to admission with last dose 10/01 at 0830.  Will need to dose based on aPTT until correlation with heparin  levels.  aPTT is therapeutic, heparin  level falsely high from DOAC, CBC ok.  Goal of Therapy:  Heparin  level 0.3-0.7 units/ml aPTT 66-102 seconds Monitor platelets by anticoagulation protocol: Yes   Plan:  Continue heparin  1050 units/h Daily aPTT, heparin  level, CBC  Ozell Jamaica, PharmD, BCPS, [facility] Clinical Pharmacist [phone] Please check AMION for all [facility] Pharmacy numbers 11/19/2023

## 2023-11-19 NOTE — Evaluation (Signed)
 Occupational Therapy Evaluation Patient Details Name: Stephanie [name] MRN: [id number] DOB: 07-Mar-1950 Today's Date: 11/19/2023   History of Present Illness   73 y.o. female who presented to the hospital 9/30 secondary to increased lower extremity swelling, shortness of breath with activity/orthopnea and weight gain. Dx acute on chronic systolic heart failure. with medical history significant of systolic heart failure, hypothyroidism, hyperlipidemia, COPD/asthma, history of DVT, fibromyalgia, GERD and type 2 diabetes mellitus with nephropathy.     Clinical Impressions Pt presents with decline in function and safety with ADLs and ADL mobility with impaired strength, balance and endurance. PTA pt lives with her daughter and was Ind with grooming, toileting, UB ADLs; daughter assists with LB ADLs and sup for showers, uses SPC, RW sometimes. To sit EOB, pt currently requires mod A to elevate trunk and LE mgt, mod A with UB/trunk support due to posterior leaning with dynamic sitting acttivity; max A with LEs back onto bed, use of bed controls to scoot to [facility], pt also used rails to help pull up in bed, min A with UB ADLs, max/total A with LB ADLs, total A with toileting and pt unable to achieve STS x 3 trials from EOB to RW, pt also had difficulty lateral scooting aling side of bed to get closer to rail before retuning to supine, OT will follow acutely to maximize level of function and safety     If plan is discharge home, recommend the following:   A lot of help with bathing/dressing/bathroom;Two people to help with walking and/or transfers;Assist for transportation;Help with stairs or ramp for entrance     Functional Status Assessment   Patient has had a recent decline in their functional status and demonstrates the ability to make significant improvements in function in a reasonable and predictable amount of time.     Equipment Recommendations   None recommended by OT     Recommendations  for Other Services         Precautions/Restrictions   Precautions Precautions: Fall Recall of Precautions/Restrictions: Intact Restrictions Weight Bearing Restrictions Per Provider Order: No     Mobility Bed Mobility Overal bed mobility: Needs Assistance       Supine to sit: Mod assist, HOB elevated, Used rails Sit to supine: Max assist   General bed mobility comments: mod A to elevate trunk and LE mgt, mod A with UB/trunk support due to posterior leaning with dynamic sitting acttivity; max A with LEs back onto bed, use of bed controls to scoot to [facility], pt also used rails to help pull up in bed    Transfers Overall transfer level: Needs assistance Equipment used: Rolling walker (2 wheels) Transfers: Sit to/from Stand             General transfer comment: STS attempted x 3 trials and pt unable to clear buttocks and to stand upright, difficulty lateral scooting to bed rail      Balance Overall balance assessment: Needs assistance Sitting-balance support: Feet supported, Bilateral upper extremity supported Sitting balance-Leahy Scale: Poor   Postural control: Posterior lean   Standing balance-Leahy Scale: Zero                             ADL either performed or assessed with clinical judgement   ADL Overall ADL's : Needs assistance/impaired Eating/Feeding: Independent;Sitting   Grooming: Wash/dry hands;Wash/dry face;Sitting   Upper Body Bathing: Minimal assistance;Sitting   Lower Body Bathing: Maximal assistance   Upper  Body Dressing : Minimal assistance;Sitting   Lower Body Dressing: Total assistance     Toilet Transfer Details (indicate cue type and reason): unable, 3 attempts STS from EOB Toileting- Clothing Manipulation and Hygiene: Total assistance;Bed level               Vision Baseline Vision/History: 1 Wears glasses Ability to See in Adequate Light: 0 Adequate Patient Visual Report: No change from baseline        Perception         Praxis         Pertinent Vitals/Pain Pain Assessment Pain Assessment: No/denies pain     Extremity/Trunk Assessment Upper Extremity Assessment Upper Extremity Assessment: Generalized weakness   Lower Extremity Assessment Lower Extremity Assessment: Defer to PT evaluation   Cervical / Trunk Assessment Cervical / Trunk Assessment: Normal   Communication Communication Communication: No apparent difficulties   Cognition Arousal: Alert Behavior During Therapy: WFL for tasks assessed/performed                                 Following commands: Intact       Cueing  General Comments   Cueing Techniques: Verbal cues;Gestural cues      Exercises     Shoulder Instructions      Home Living Family/patient expects to be discharged to:: Private residence Living Arrangements: Children Available Help at Discharge: Family;Available PRN/intermittently Type of Home: House Home Access: Stairs to enter [employer] of Steps: 1 Entrance Stairs-Rails: None Home Layout: One level     Bathroom Shower/Tub: Chief Strategy Officer: Handicapped height Bathroom Accessibility: No   Home Equipment: Agricultural consultant (2 wheels);Cane - single point;BSC/3in1;Wheelchair - manual          Prior Functioning/Environment Prior Level of Function : Needs assist;History of Falls (last six months)             Mobility Comments: Using SPC prior to decline, occasional use of RW. + falls ADLs Comments: Ind with grooming, toileting, UB ADLs; daughter assists with LB ADLs and sup for showers    OT Problem List: Decreased strength;Decreased knowledge of use of DME or AE;Decreased activity tolerance;Impaired balance (sitting and/or standing);Obesity   OT Treatment/Interventions: Self-care/ADL training;Patient/family education;Therapeutic exercise;Balance training;Neuromuscular education;Therapeutic activities;DME and/or AE instruction       OT Goals(Current goals can be found in the care plan section)   Acute Rehab OT Goals Patient Stated Goal: go home OT Goal Formulation: With patient Time For Goal Achievement: 12/03/23 Potential to Achieve Goals: Good ADL Goals Pt Will Perform Grooming: with supervision;with set-up Pt Will Perform Upper Body Bathing: with contact guard assist;with supervision;sitting Pt Will Perform Lower Body Bathing: with mod assist;sitting/lateral leans Pt Will Perform Upper Body Dressing: with contact guard assist;with supervision;sitting Pt Will Transfer to Toilet: with max assist;with mod assist;stand pivot transfer   OT Frequency:  Min 2X/week    Co-evaluation              AM-PAC OT 6 Clicks Daily Activity     Outcome Measure Help from another person eating meals?: None Help from another person taking care of personal grooming?: A Little Help from another person toileting, which includes using toliet, bedpan, or urinal?: Total Help from another person bathing (including washing, rinsing, drying)?: A Lot Help from another person to put on and taking off regular upper body clothing?: A Little Help from another person to put on and  taking off regular lower body clothing?: Total 6 Click Score: 14   End of Session Equipment Utilized During Treatment: Gait belt;Rolling walker (2 wheels) Nurse Communication: Mobility status  Activity Tolerance: Patient tolerated treatment well Patient left: in bed;with call bell/phone within reach  OT Visit Diagnosis: Other abnormalities of gait and mobility (R26.89);Unsteadiness on feet (R26.81);History of falling (Z91.81);Muscle weakness (generalized) (M62.81)                Time: 9052-8974 OT Time Calculation (min): 38 min Charges:  OT General Charges $OT Visit: 1 Visit OT Evaluation $OT Eval Moderate Complexity: 1 Mod OT Treatments $Self Care/Home Management : 8-22 mins $Therapeutic Activity: 8-22 mins   Jacques Karna Loose 11/19/2023, 12:47 PM

## 2023-11-19 NOTE — TOC Progression Note (Addendum)
 Transition of Care [facility]) - Progression Note    Patient Details  Name: Stephanie [name] MRN: [id number] Date of Birth: Apr 01, 1950  [facility] CM/SW Contact  Isaiah Public, LCSWA Phone Number: 11/19/2023, 10:36 AM  Clinical Narrative:     CSW received consult for possible SNF placement at time of discharge. CSW spoke with patient at bedside regarding PT recommendation of SNF placement at time of discharge. Patient reports PTA she comes from home with daughter. Patient expressed understanding of PT recommendation and would like to discuss PT recs./dc plan with her daughter before making final decision. CSW discussed insurance authorization process with patient. All questions answered.No further questions reported at this time. CSW to continue to follow and assist with discharge planning needs.   Update- CSW followed up with patient at bedside. Patient is agreeable to SNF placement. Patient request for CSW to fax out initial referral near the [place] area for SNF placement. All questions answered. No further questions reported at this time. Patients passr pending. CSW awaiting 30 day note and FL2 to be cosigned by MD then will submit requested clinicals to [place] must for review.  Update- CSW uploaded clinicals to [place] must for review. Patients passr currently pending. Debbie with [facility] confirmed facility can offer SNF bed for patient.  Expected Discharge Plan:  (TBD) Barriers to Discharge: Continued Medical Work up               Expected Discharge Plan and Services In-house Referral: Clinical Social Work     Living arrangements for the past 2 months: Single Family Home                                       Social Drivers of Health (SDOH) Interventions SDOH Screenings   Food Insecurity: No Food Insecurity (11/17/2023)  Housing: Low Risk  (11/17/2023)  Transportation Needs: No Transportation Needs (11/17/2023)  Utilities: Not At Risk (11/17/2023)   Depression (PHQ2-9): Low Risk  (09/14/2023)  Social Connections: Moderately Isolated (11/17/2023)  Tobacco Use: Medium Risk (11/16/2023)    Readmission Risk Interventions     No data to display

## 2023-11-20 DIAGNOSIS — I5023 Acute on chronic systolic (congestive) heart failure: Secondary | ICD-10-CM | POA: Diagnosis not present

## 2023-11-20 LAB — BASIC METABOLIC PANEL WITH GFR
Anion gap: 7 (ref 5–15)
BUN: 27 mg/dL — ABNORMAL HIGH (ref 8–23)
CO2: 27 mmol/L (ref 22–32)
Calcium: 8.5 mg/dL — ABNORMAL LOW (ref 8.9–10.3)
Chloride: 100 mmol/L (ref 98–111)
Creatinine, Ser: 1.63 mg/dL — ABNORMAL HIGH (ref 0.44–1.00)
GFR, Estimated: 33 mL/min — ABNORMAL LOW (ref >60)
Glucose, Bld: 191 mg/dL — ABNORMAL HIGH (ref 70–99)
Potassium: 3.5 mmol/L (ref 3.5–5.1)
Sodium: 134 mmol/L — ABNORMAL LOW (ref 135–145)

## 2023-11-20 LAB — APTT
aPTT: 145 s — ABNORMAL HIGH (ref 24–36)
aPTT: 69 s — ABNORMAL HIGH (ref 24–36)
aPTT: >200 s (ref 24–36)
aPTT: 58 s — ABNORMAL HIGH (ref 24–36)

## 2023-11-20 LAB — CBC
HCT: 32.5 % — ABNORMAL LOW (ref 36.0–46.0)
Hemoglobin: 10.3 g/dL — ABNORMAL LOW (ref 12.0–15.0)
MCH: 26.9 pg (ref 26.0–34.0)
MCHC: 31.7 g/dL (ref 30.0–36.0)
MCV: 84.9 fL (ref 80.0–100.0)
Platelets: 172 K/uL (ref 150–400)
RBC: 3.83 MIL/uL — ABNORMAL LOW (ref 3.87–5.11)
RDW: 15.4 % (ref 11.5–15.5)
WBC: 8.2 K/uL (ref 4.0–10.5)
nRBC: 0 % (ref 0.0–0.2)

## 2023-11-20 LAB — GLUCOSE, CAPILLARY
Glucose-Capillary: 203 mg/dL — ABNORMAL HIGH (ref 70–99)
Glucose-Capillary: 179 mg/dL — ABNORMAL HIGH (ref 70–99)
Glucose-Capillary: 128 mg/dL — ABNORMAL HIGH (ref 70–99)
Glucose-Capillary: 124 mg/dL — ABNORMAL HIGH (ref 70–99)

## 2023-11-20 LAB — HEPARIN LEVEL (UNFRACTIONATED): Heparin Unfractionated: 0.81 [IU]/mL — ABNORMAL HIGH (ref 0.30–0.70)

## 2023-11-20 MED ORDER — LUBIPROSTONE 24 MCG PO CAPS
24.0000 ug | ORAL_CAPSULE | Freq: Two times a day (BID) | ORAL | Status: DC | PRN
  Administered 2023-11-20: 24 ug via ORAL
  Filled 2023-11-20 (×2): qty 1

## 2023-11-20 MED ORDER — BISACODYL 10 MG RE SUPP
10.0000 mg | Freq: Once | RECTAL | Status: AC
  Administered 2023-11-20: 10 mg via RECTAL
  Filled 2023-11-20: qty 1

## 2023-11-20 MED ORDER — POTASSIUM CHLORIDE CRYS ER 20 MEQ PO TBCR
40.0000 meq | EXTENDED_RELEASE_TABLET | Freq: Every day | ORAL | Status: DC
  Administered 2023-11-20 – 2023-11-30 (×11): 40 meq via ORAL
  Filled 2023-11-20 (×11): qty 2

## 2023-11-20 NOTE — Progress Notes (Signed)
 Rounding Note   Patient Name: Stephanie [name] Date of Encounter: 11/20/2023  [facility] Cardiologist: Jayson Sierras, MD   Subjective  Pt states she is improving, but not back to baseline. Also with myalgias now. Found with HOB elevated.  Scheduled Meds:  arformoterol  15 mcg Nebulization BID   And   umeclidinium bromide  1 puff Inhalation Daily   vitamin C   500 mg Oral Daily   atorvastatin   80 mg Oral Daily   bisacodyl   10 mg Rectal Once   bisoprolol   10 mg Oral Daily   Chlorhexidine  Gluconate Cloth  6 each Topical Daily   cycloSPORINE  1 drop Both Eyes BID   DULoxetine   60 mg Oral BID   insulin  aspart  0-15 Units Subcutaneous TID WC   insulin  aspart  0-5 Units Subcutaneous QHS   insulin  glargine  8 Units Subcutaneous QHS   levothyroxine   100 mcg Oral QAC breakfast   loratadine   10 mg Oral Daily   pantoprazole   40 mg Oral Daily   pregabalin   75 mg Oral BID   sacubitril-valsartan  1 tablet Oral BID   sodium chloride  flush  10-40 mL Intracatheter Q12H   traZODone   150 mg Oral QHS   ursodiol  300 mg Oral BID   Continuous Infusions:  furosemide  120 mg (11/19/23 1810)   heparin  Stopped (11/20/23 0942)   PRN Meds: acetaminophen  **OR** acetaminophen , HYDROmorphone  (DILAUDID ) injection, hydrOXYzine , ipratropium-albuterol , lubiprostone , methocarbamol , ondansetron  **OR** ondansetron  (ZOFRAN ) IV, sodium chloride  flush, traMADol    Vital Signs  Vitals:   11/19/23 2338 11/20/23 0356 11/20/23 0819 11/20/23 0911  BP: 110/80 133/83    Pulse: 91 89    Resp: 19 20  18   Temp: 98.4 F (36.9 C) 98.1 F (36.7 C) 98.8 F (37.1 C)   TempSrc: Oral Oral Oral   SpO2: 95% 95%    Weight:  119.1 kg    Height:        Intake/Output Summary (Last 24 hours) at 11/20/2023 1143 Last data filed at 11/20/2023 0404 Gross per 24 hour  Intake 177 ml  Output 2200 ml  Net -2023 ml      11/20/2023    3:56 AM 11/19/2023    4:34 AM 11/18/2023    4:00 AM  Last 3 Weights  Weight (lbs) 262  lb 9.1 oz 268 lb 4.8 oz 271 lb 2.7 oz  Weight (kg) 119.1 kg 121.7 kg 123 kg      Telemetry Sinus rhythm with PACs - Personally Reviewed  ECG  No new tracings - Personally Reviewed  Physical Exam  GEN: obese female appears uncomfortable Neck: + JVD to angle of mandible  Cardiac: distant heart sounds  Respiratory: diminished bilaterally GI: mildly distended, nontender  MS: at least 2+ B LE edema, tense skin Neuro:  Nonfocal  Psych: Normal affect   Labs High Sensitivity Troponin:  No results for input(s): TROPONINIHS in the last 720 hours.   Chemistry Recent Labs  Lab 11/16/23 1201 11/16/23 1325 11/17/23 0427 11/18/23 1745 11/19/23 0712 11/19/23 0900 11/19/23 1518 11/20/23 0705  NA 130*  --    < > 132*  131* 133*  --  130* 134*  K 4.5  --    < > 3.9  3.9 3.8  --  4.7 3.5  CL 95*  --    < > 99  98 97*  --  98 100  CO2 21*  --    < > 26  26 28   --  24 27  GLUCOSE 159*  --    < > 145*  143* 138*  --  138* 191*  BUN 25*  --    < > 27*  28* 27*  --  26* 27*  CREATININE 1.34*  --    < > 1.58*  1.54* 1.50*  --  1.48* 1.63*  CALCIUM  9.6  --    < > 8.7*  8.5* 9.1  --  8.6* 8.5*  MG  --  1.6*  --  1.6*  1.7  --  1.6*  --   --   PROT 9.1*  --   --   --   --   --   --   --   ALBUMIN 3.3*  --   --   --   --   --   --   --   AST 39  --   --   --   --   --   --   --   ALT 30  --   --   --   --   --   --   --   ALKPHOS 410*  --   --   --   --   --   --   --   BILITOT 3.1*  --   --   --   --   --   --   --   GFRNONAA 42*  --    < > 34*  35* 37*  --  37* 33*  ANIONGAP 14  --    < > 7  7 8   --  8 7   < > = values in this interval not displayed.    Lipids  Recent Labs  Lab 11/18/23 1745  CHOL 113  TRIG 42  HDL 41  LDLCALC 64  CHOLHDL 2.8    Hematology Recent Labs  Lab 11/18/23 1745 11/19/23 0712 11/20/23 0705  WBC 7.4 6.9 8.2  RBC 4.22 4.54 3.83*  HGB 11.3* 12.3 10.3*  HCT 35.9* 38.5 32.5*  MCV 85.1 84.8 84.9  MCH 26.8 27.1 26.9  MCHC 31.5 31.9 31.7   RDW 15.5 15.7* 15.4  PLT 178 194 172   Thyroid   Recent Labs  Lab 11/16/23 1335 11/17/23 0857  TSH 6.730*  --   FREET4  --  2.40*    BNP Recent Labs  Lab 11/16/23 1201 11/18/23 1745  BNP  --  2,302.1*  PROBNP 14,798.0*  --     DDimer No results for input(s): DDIMER in the last 168 hours.   Radiology  No results found.   Cardiac Studies  Echo 11/17/23:  1. Left ventricular ejection fraction, by estimation, is 35 to 40%. Left  ventricular ejection fraction by 3D volume is 33 %. The left ventricle has  moderately decreased function. The left ventricle demonstrates regional  wall motion abnormalities (see  scoring diagram/findings for description). The left ventricular internal  cavity size was mildly dilated. Left ventricular diastolic parameters are  indeterminate.   2. Right ventricular systolic function is mildly reduced. The right  ventricular size is severely enlarged. Moderately increased right  ventricular wall thickness. There is normal pulmonary artery systolic  pressure. The estimated right ventricular  systolic pressure is 31.8 mmHg.   3. Large pleural effusion.   4. The mitral valve is normal in structure. Mild mitral valve  regurgitation. No evidence of mitral stenosis.   5. The aortic valve is tricuspid. Aortic valve regurgitation is not  visualized. No aortic stenosis is present.   6. The inferior vena cava is dilated in size with >50% respiratory  variability, suggesting right atrial pressure of 8 mmHg.   Patient Profile    73 y.o. 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 Cardiology is following for evaluation of decompensated CHF.   Assessment & Plan   Acute on chronic systolic heart failure Anasarca  - prior LVEF 40% in 05/2023 - echo this admission with LVEF 35-40% with RWMA,  mildly reduced RV function, severe RV enlargement with normal PASP, large pericardial effusion  - lasix  drip initially but changed to bolus dosing  - weight improving, down 2 kg overnight - Creatinine 1.48->1.63 today - GDMT: no SGLT2i due to fatigue and frequent yeast infections, continue 10 mg bisoprolol , 24-26 mg entresto  - consider adding spironolactone prior to discharge - continue 120 mg IV lasix  BID and add K supplement  - plan tentatively for R/L [facility] Monday if good diuresis over the weekend   DVT - holding home eliquis  pending likely heart cath on Monday with adequate diuresis - telemetry appears sinus rhythm with PACs, low voltage p wave  Hypertension - managed in the context of HFrEF  Myalgias per primary team  Hyperlipidemia with LDL goal < 70 11/18/2023: Cholesterol 113; HDL 41; LDL Cholesterol 64; Triglycerides 42; VLDL 8 Continue statin - 80 mg lipitor         For questions or updates, please contact [facility] Please consult [email or web address] for contact info under       Signed, Emeline FORBES Calender, MD  11/20/2023, 11:43 AM

## 2023-11-20 NOTE — Progress Notes (Signed)
 PHARMACY - ANTICOAGULATION  Pharmacy Consult for heparin  Indication: hx DVT Brief A/P: aPTT subtherapeutic Increase Heparin  rate  Allergies  Allergen Reactions   Tetracyclines & Related Anaphylaxis and Rash   Banana Hives and Nausea And Vomiting   Jardiance  [Empagliflozin ]     Yeast infections, fatigue   Penicillins Rash and Other (See Comments)    Patient Measurements: Height: 5' 8 (172.7 cm) Weight: 119.1 kg (262 lb 9.1 oz) IBW/kg (Calculated) : 63.9 HEPARIN  DW (KG): 95.1  Vital Signs: Temp: 97.6 F (36.4 C) (10/04 2108) Temp Source: Oral (10/04 2108) BP: 115/70 (10/04 2108) Pulse Rate: 79 (10/04 2108)  Labs: Recent Labs     0000 11/18/23 0730 11/18/23 1745 11/18/23 2036 11/19/23 0712 11/19/23 0714 11/19/23 1518 11/20/23 0705 11/20/23 0706 11/20/23 1056 11/20/23 2000 11/20/23 2206  HGB   < >  --  11.3*  --  12.3  --   --  10.3*  --   --   --   --   HCT  --   --  35.9*  --  38.5  --   --  32.5*  --   --   --   --   PLT  --   --  178  --  194  --   --  172  --   --   --   --   APTT  --   --  150*   < >  --  80*  --  145*  --  69* >200* 58*  HEPARINUNFRC  --  >1.10*  --   --   --  0.99*  --   --  0.81*  --   --   --   CREATININE   < >  --  1.58*  1.54*  --  1.50*  --  1.48* 1.63*  --   --   --   --    < > = values in this interval not displayed.   Estimated Creatinine Clearance: 41.7 mL/min (A) (by C-G formula based on SCr of 1.63 mg/dL (H)).  Assessment: 73 y.o. female with h/o DVT, Eliquis  on hold, for heparin   Goal of Therapy:  Heparin  level 0.3-0.7 units/ml aPTT 66-102 seconds Monitor platelets by anticoagulation protocol: Yes   Plan:  Increase Heparin  1000 units/hr Follow-up am labs.  Cathlyn Arrant, PharmD, BCPS  11/20/2023 11:06 PM

## 2023-11-20 NOTE — TOC Progression Note (Signed)
 Transition of Care [facility]) - Initial/Assessment Note    Patient Details  Name: Stephanie [name] MRN: [id number] Date of Birth: 1950-09-22  Transition of Care [facility]) CM/SW Contact:    Britt JULIANNA Bennetts, LCSW Phone Number: 11/20/2023, 2:26 PM  Clinical Narrative:                 CSW met with patient at bedside and informed her that [facility] (SNF) extended bed offer.  Pt still agreeable to transferring to [facility] at d/c.  PASRR still pending.  TOC will continue to follow.   Expected Discharge Plan:  (TBD) Barriers to Discharge: Continued Medical Work up   Patient Goals and CMS Choice Patient states their goals for this hospitalization and ongoing recovery are:: TBD   Choice offered to / list presented to : Patient [place] ownership interest in [facility].provided to::  (n/a)    Expected Discharge Plan and Services In-house Referral: Clinical Social Work     Living arrangements for the past 2 months: Single Family Home                                      Prior Living Arrangements/Services Living arrangements for the past 2 months: Single Family Home Lives with:: Adult Children (daughter) Patient language and need for interpreter reviewed:: Yes Do you feel safe going back to the place where you live?: Yes      Need for Family Participation in Patient Care: Yes (Comment) Care giver support system in place?: Yes (comment) Current home services: DME (cane, walker, O2) Criminal Activity/Legal Involvement Pertinent to Current Situation/Hospitalization: No - Comment as needed  Activities of Daily Living   ADL Screening (condition at time of admission) Independently performs ADLs?: Yes (appropriate for developmental age) Is the patient deaf or have difficulty hearing?: Yes Does the patient have difficulty seeing, even when wearing glasses/contacts?: Yes Does the patient have difficulty concentrating, remembering, or making decisions?: No  Permission  Sought/Granted Permission sought to share information with : Case Manager, Magazine features editor, Family Supports                Emotional Assessment Appearance:: Appears stated age Attitude/Demeanor/Rapport: Gracious Affect (typically observed): Calm Orientation: : Oriented to Self, Oriented to Place, Oriented to  Time, Oriented to Situation Alcohol / Substance Use: Not Applicable Psych Involvement: No (comment)  Admission diagnosis:  Heart failure, systolic, acute on chronic (HCC) [I50.23] Acute congestive heart failure, unspecified heart failure type (HCC) [I50.9] Patient Active Problem List   Diagnosis Date Noted   Heart failure, systolic, acute on chronic (HCC) 11/16/2023   COVID-19 01/09/2023   Right-sided chest pain 01/08/2023   Elevated troponin 01/08/2023   Chronic respiratory failure with hypoxia (HCC) 01/08/2023   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   Anxiety 12/23/2021   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) 11/25/2021   Hyperlipidemia 11/25/2021   Anemia due to chronic kidney disease 11/25/2021   Bilateral primary osteoarthritis of knee 09/15/2021   Acute on chronic combined systolic and diastolic CHF (congestive heart failure) (HCC) 05/20/2021   At risk for obstructive sleep apnea 05/20/2021   History of stroke 05/17/2021   Protein-calorie malnutrition, moderate 05/17/2021   Cerebral infarction, unspecified (HCC) 05/17/2021   NASH (nonalcoholic steatohepatitis) 01/21/2021   Fibromyalgia 07/14/2020   Dobberstein (dyspnea on exertion) 06/18/2020   Chronic pain syndrome 04/10/2020   Acute on chronic  diastolic CHF (congestive heart failure) (HCC)    Lab test positive for detection of COVID-19 virus 03/17/2020   Pneumonia due to COVID-19 virus 03/11/2020   Acute respiratory disease due to COVID-19 virus 03/11/2020   Long term (current) use of anticoagulants 08/16/2018   Abnormal LFTs 02/28/2018   TIA (transient ischemic  attack) 09/15/2017   Localized osteoarthritis of right shoulder 08/08/2017   Acute embolism and thrombosis of unspecified deep veins of lower extremity, bilateral (HCC) 03/16/2017   Anemia of chronic renal failure 03/16/2017   Atrial fibrillation (HCC) 03/16/2017   Chronic kidney disease, stage 3 unspecified (HCC) 03/16/2017   Metabolic acidosis 03/16/2017   Dysphagia 12/16/2015   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   Hyperglycemia due to type 2 diabetes mellitus (HCC) 12/09/2015   History of thromboembolism 11/08/2015   Constipation 10/27/2015   Diarrhea 08/02/2015   Elevated alkaline phosphatase level 07/23/2015   Heme positive stool 07/23/2015   Alkaline phosphatase raised 07/23/2015   Hypoglycemia 07/01/2015   Deep vein thrombosis (DVT) of both lower extremities (HCC) 06/27/2015   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   Maculopapular rash, generalized 06/27/2015   Alopecia 06/27/2015   Hypothyroidism 06/27/2015   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) 06/27/2015   Diverticulitis 10/19/2013   Former cigarette smoker 10/19/2013   Obesity 10/19/2013   Diabetes (HCC) 10/19/2013   Hypertension    Anemia    GERD (gastroesophageal reflux disease)    COPD (chronic obstructive pulmonary disease) (HCC)    PCP:  Pllc, [facility] Pharmacy:   [id number] - [place],  - [address] [place] [address] Phone: [phone] Fax: [phone]  [facility] [address] [place] - [address] [place] [address] [place] [address] [place] [address] Phone: [phone] Fax: [phone]     Social Drivers of Health (SDOH) Social History: SDOH Screenings   Food Insecurity: No Food Insecurity (11/17/2023)  Housing: Low Risk  (11/17/2023)  Transportation Needs: No Transportation Needs (11/17/2023)  Utilities: Not At Risk (11/17/2023)  Depression  (PHQ2-9): Low Risk  (09/14/2023)  Social Connections: Moderately Isolated (11/17/2023)  Tobacco Use: Medium Risk (11/16/2023)   SDOH Interventions:     Readmission Risk Interventions     No data to display

## 2023-11-20 NOTE — Progress Notes (Signed)
 Progress Note   Patient: Stephanie [name] FMW:[id number] DOB: 19-Sep-1950 DOA: 11/16/2023     4 DOS: the patient was seen and examined on 11/20/2023   Brief hospital course: 73 y.o. female with medical history significant of systolic heart failure, hypothyroidism, hyperlipidemia, COPD/asthma, history of DVT, fibromyalgia, GERD and type 2 diabetes mellitus with nephropathy; who presented to the hospital secondary to increased lower extremity swelling, shortness of breath with activity/orthopnea and weight gain.  Patient reports symptom has been present for the last 3 to 4 days and worsening; she was scheduled to be seen by cardiology service as an outpatient but unfortunately due to worsening of her symptoms did not make it and presented to the emergency department instead.     Of note, patient reports to be compliant with her medications.   ED:  Demonstrating elevated BNP, signs of fluid overload on physical exam and a chest x-ray with vascular congestion/interstitial edema.  IV Lasix  was started  Assessment and Plan: Acute on chronic systolic heart failure - Hemodynamically stable, satting 100% on 1 L of oxygen  -Repeat 2d echo with EF 35-40% with regional WMA, mildly reduced RV function and severely enlarged RV size  - h/o EJF around 40% on previous echo. - Presenting with complaint of weight gain, orthopnea, lower extremity swelling and elevated BNP -presenting BNP 14,798.0 - Cardiology following, cont 120mg  IV lasix  bid - Continue Entresto , Bisoprolol ,   -cont heparin  gtt. Plan LHC/RHC monday   COPD/asthma - Appears to be stable and currently no signs of acute exacerbation - Continue bronchodilator management.   Type 2 diabetes with nephropathy - Update A1c - Checking CBG q. ACHS, SSI coverage   Acute on Chronic kidney disease stage IIIb  - With a slightly increase in patient's creatinine from recent values most likely in the setting of decreased perfusion with CHF exacerbation -pt  continues to void well   Hypertension - Stable on Lasix  drip, Entresto  - Continue current antihypertensive agent -Holding home medication spironolactone   GERD - Continue PPI   Hypothyroidism - TSH: 6.73 -Checking free T3, T4 - Continue Synthroid .   Depression/anxiety/neuropathy and Fibromyalgia - Continue Cymbalta  and Atarax  - Continue Lyrica  and Robaxin  - Complained of increased pain this AM. Added PRN IV dilaudid  for breakthrough    History of DVT and prior CVA - No acute neurologic deficit - Continue heparin  gtt for now    Hyperlipidemia - Resume outpatient Repatha  after discharge  Constipation -No BM in nearly one week -Will give trial of dulcolax suppository  -If still no result, may need enema   Subjective: Constipated. Reports no BM in one week  Physical Exam: Vitals:   11/19/23 2338 11/20/23 0356 11/20/23 0819 11/20/23 0911  BP: 110/80 133/83    Pulse: 91 89    Resp: 19 20  18   Temp: 98.4 F (36.9 C) 98.1 F (36.7 C) 98.8 F (37.1 C)   TempSrc: Oral Oral Oral   SpO2: 95% 95%    Weight:  119.1 kg    Height:       General exam: Awake, laying in bed, in nad Respiratory system: Normal respiratory effort, no wheezing Cardiovascular system: regular rate, s1, s2 Gastrointestinal system: Soft, nondistended, positive BS Central nervous system: CN2-12 grossly intact, strength intact Extremities: Perfused, no clubbing Skin: Normal skin turgor, no notable skin lesions seen Psychiatry: Mood normal // no visual hallucinations   Data Reviewed:  Labs reviewed: Na 134, K 3.5, Cr 1.63, WBC 8.2, Hgb 10.3, Plts 172  Family  Communication: Pt in room, family not at bedside  Disposition: Status is: Inpatient Remains inpatient appropriate because: severity of illness  Planned Discharge Destination: Home and Skilled nursing facility    Author: Garnette Pelt, MD 11/20/2023 3:10 PM  For on call review [email or web address].

## 2023-11-20 NOTE — Progress Notes (Signed)
 PHARMACY - ANTICOAGULATION CONSULT NOTE  Pharmacy Consult for heparin  Indication: hx DVT  Allergies  Allergen Reactions   Tetracyclines & Related Anaphylaxis and Rash   Banana Hives and Nausea And Vomiting   Jardiance  [Empagliflozin ]     Yeast infections, fatigue   Penicillins Rash and Other (See Comments)    Patient Measurements: Height: 5' 8 (172.7 cm) Weight: 119.1 kg (262 lb 9.1 oz) IBW/kg (Calculated) : 63.9 HEPARIN  DW (KG): 95.1  Vital Signs: Temp: 98.8 F (37.1 C) (10/04 0819) Temp Source: Oral (10/04 0819) BP: 133/83 (10/04 0356) Pulse Rate: 89 (10/04 0356)  Labs: Recent Labs    11/18/23 0730 11/18/23 1745 11/18/23 1745 11/18/23 2036 11/19/23 0712 11/19/23 0714 11/19/23 1518 11/20/23 0705 11/20/23 0706  HGB  --  11.3*   < >  --  12.3  --   --  10.3*  --   HCT  --  35.9*  --   --  38.5  --   --  32.5*  --   PLT  --  178  --   --  194  --   --  172  --   APTT  --  150*   < > 158*  --  80*  --  145*  --   HEPARINUNFRC >1.10*  --   --   --   --  0.99*  --   --  0.81*  CREATININE  --  1.58*  1.54*   < >  --  1.50*  --  1.48* 1.63*  --    < > = values in this interval not displayed.   Estimated Creatinine Clearance: 41.7 mL/min (A) (by C-G formula based on SCr of 1.63 mg/dL (H)).  Assessment: Pharmacy consulted to dose heparin  in patient with history of DVT.  Tentatively planning for [facility] on Monday and utilizing heparin  drip while holding PTA Eliquis  in meantime. Last dose of Eliquis  PTA was 10/01 at 0830.  Will need to dose based on aPTT until correlation with heparin  levels.  Hgb 12.3 > 10.3; plt wnl. No s/sx bleeding documented. No issues with heparin  infusion documented.   aPTT supratherapeutic at 145 on infusion of 1050 units/h.   Goal of Therapy:  Heparin  level 0.3-0.7 units/ml aPTT 66-102 seconds Monitor platelets by anticoagulation protocol: Yes   Plan:  -Pause heparin  infusion for 1h, then restart infusion at decreased rate of 750 units/h.  Notified RN of need to pause then change rate, RN acknowledged.  -Check aPTT in 8h.  -Daily aPTT, heparin  level, CBC  Maurilio Patten, PharmD PGY1 Pharmacy Resident [facility]  11/20/2023 9:12 AM

## 2023-11-21 DIAGNOSIS — I5023 Acute on chronic systolic (congestive) heart failure: Secondary | ICD-10-CM | POA: Diagnosis not present

## 2023-11-21 DIAGNOSIS — I509 Heart failure, unspecified: Secondary | ICD-10-CM | POA: Diagnosis not present

## 2023-11-21 LAB — HEPARIN LEVEL (UNFRACTIONATED): Heparin Unfractionated: 0.76 [IU]/mL — ABNORMAL HIGH (ref 0.30–0.70)

## 2023-11-21 LAB — APTT
aPTT: 105 s — ABNORMAL HIGH (ref 24–36)
aPTT: 178 s (ref 24–36)

## 2023-11-21 LAB — BASIC METABOLIC PANEL WITH GFR
Anion gap: 8 (ref 5–15)
BUN: 28 mg/dL — ABNORMAL HIGH (ref 8–23)
CO2: 28 mmol/L (ref 22–32)
Calcium: 8.8 mg/dL — ABNORMAL LOW (ref 8.9–10.3)
Chloride: 98 mmol/L (ref 98–111)
Creatinine, Ser: 1.62 mg/dL — ABNORMAL HIGH (ref 0.44–1.00)
GFR, Estimated: 33 mL/min — ABNORMAL LOW (ref >60)
Glucose, Bld: 72 mg/dL (ref 70–99)
Potassium: 3.6 mmol/L (ref 3.5–5.1)
Sodium: 134 mmol/L — ABNORMAL LOW (ref 135–145)

## 2023-11-21 LAB — MAGNESIUM: Magnesium: 1.7 mg/dL (ref 1.7–2.4)

## 2023-11-21 LAB — CBC
HCT: 35.4 % — ABNORMAL LOW (ref 36.0–46.0)
Hemoglobin: 11.4 g/dL — ABNORMAL LOW (ref 12.0–15.0)
MCH: 27.1 pg (ref 26.0–34.0)
MCHC: 32.2 g/dL (ref 30.0–36.0)
MCV: 84.3 fL (ref 80.0–100.0)
Platelets: 190 K/uL (ref 150–400)
RBC: 4.2 MIL/uL (ref 3.87–5.11)
RDW: 15.4 % (ref 11.5–15.5)
WBC: 8.4 K/uL (ref 4.0–10.5)
nRBC: 0 % (ref 0.0–0.2)

## 2023-11-21 LAB — GLUCOSE, CAPILLARY
Glucose-Capillary: 77 mg/dL (ref 70–99)
Glucose-Capillary: 151 mg/dL — ABNORMAL HIGH (ref 70–99)
Glucose-Capillary: 134 mg/dL — ABNORMAL HIGH (ref 70–99)
Glucose-Capillary: 122 mg/dL — ABNORMAL HIGH (ref 70–99)

## 2023-11-21 MED ORDER — MAGNESIUM SULFATE 4 GM/100ML IV SOLN
4.0000 g | Freq: Once | INTRAVENOUS | Status: AC
  Administered 2023-11-21: 4 g via INTRAVENOUS
  Filled 2023-11-21: qty 100

## 2023-11-21 MED ORDER — FREE WATER
250.0000 mL | Freq: Once | Status: AC
  Administered 2023-11-22: 250 mL via ORAL

## 2023-11-21 MED ORDER — ASPIRIN 81 MG PO CHEW
81.0000 mg | CHEWABLE_TABLET | ORAL | Status: AC
  Administered 2023-11-22: 81 mg via ORAL
  Filled 2023-11-21: qty 1

## 2023-11-21 NOTE — Progress Notes (Signed)
 Progress Note   Patient: Stephanie [name] FMW:[id number] DOB: October 08, 1950 DOA: 11/16/2023     5 DOS: the patient was seen and examined on 11/21/2023   Brief hospital course: 73 y.o. female with medical history significant of systolic heart failure, hypothyroidism, hyperlipidemia, COPD/asthma, history of DVT, fibromyalgia, GERD and type 2 diabetes mellitus with nephropathy; who presented to the hospital secondary to increased lower extremity swelling, shortness of breath with activity/orthopnea and weight gain.  Patient reports symptom has been present for the last 3 to 4 days and worsening; she was scheduled to be seen by cardiology service as an outpatient but unfortunately due to worsening of her symptoms did not make it and presented to the emergency department instead.     Of note, patient reports to be compliant with her medications.   ED:  Demonstrating elevated BNP, signs of fluid overload on physical exam and a chest x-ray with vascular congestion/interstitial edema.  IV Lasix  was started  Assessment and Plan: Acute on chronic systolic heart failure - Hemodynamically stable, satting 100% on 1 L of oxygen  -Repeat 2d echo with EF 35-40% with regional WMA, mildly reduced RV function and severely enlarged RV size  - h/o EJF around 40% on previous echo. - Presenting with complaint of weight gain, orthopnea, lower extremity swelling and elevated BNP -presenting BNP 14,798.0 - Cardiology following, cont 120mg  IV lasix  bid - Continue Entresto , Bisoprolol ,   -cont heparin  gtt. Plan LHC/RHC monday   COPD/asthma - Appears to be stable and currently no signs of acute exacerbation - Continue bronchodilator management.   Type 2 diabetes with nephropathy - Update A1c - Checking CBG q. ACHS, SSI coverage   Acute on Chronic kidney disease stage IIIb  - With a slightly increase in patient's creatinine from recent values most likely in the setting of decreased perfusion with CHF exacerbation -pt  continues to void well   Hypertension - Stable on Lasix  drip, Entresto  - Continue current antihypertensive agent -Holding home medication spironolactone   GERD - Continue PPI   Hypothyroidism - TSH: 6.73 -Checking free T3, T4 - Continue Synthroid .   Depression/anxiety/neuropathy and Fibromyalgia - Continue Cymbalta  and Atarax  - Continue Lyrica  and Robaxin  - Complained of increased pain this AM. Added PRN IV dilaudid  for breakthrough    History of DVT and prior CVA - No acute neurologic deficit - Continue heparin  gtt for now    Hyperlipidemia - Resume outpatient Repatha  after discharge  Constipation -Pt reports good results overnight and this AM with cathartics   Subjective: Reports multiple BM  Physical Exam: Vitals:   11/21/23 0014 11/21/23 0425 11/21/23 0700 11/21/23 1200  BP: 106/78 116/71 113/74 (!) 122/49  Pulse: (!) 44 87 82   Resp: 18 18 18    Temp: 97.6 F (36.4 C) (!) 97.5 F (36.4 C) 97.6 F (36.4 C)   TempSrc: Oral Oral Oral   SpO2: 94% 100% 98%   Weight:  117.6 kg    Height:       General exam: Conversant, in no acute distress Respiratory system: normal chest rise, clear, no audible wheezing Cardiovascular system: regular rhythm, s1-s2 Gastrointestinal system: Nondistended, nontender, pos BS Central nervous system: No seizures, no tremors Extremities: No cyanosis, no joint deformities Skin: No rashes, no pallor Psychiatry: Affect normal // no auditory hallucinations   Data Reviewed:  Labs reviewed: Na 134, K 3.6, Cr 1.62, Mg 1.7, WBC 8.4, Hgb 11.4, Plts 190  Family Communication: Pt in room, family not at bedside  Disposition: Status is:  Inpatient Remains inpatient appropriate because: severity of illness  Planned Discharge Destination: Home and Skilled nursing facility    Author: Garnette Pelt, MD 11/21/2023 3:27 PM  For on call review [email or web address].

## 2023-11-21 NOTE — Progress Notes (Signed)
 PHARMACY - ANTICOAGULATION  Pharmacy Consult for heparin  Indication: hx DVT  Allergies  Allergen Reactions   Tetracyclines & Related Anaphylaxis and Rash   Banana Hives and Nausea And Vomiting   Jardiance  [Empagliflozin ]     Yeast infections, fatigue   Penicillins Rash and Other (See Comments)    Patient Measurements: Height: 5' 8 (172.7 cm) Weight: 117.6 kg (259 lb 4.2 oz) IBW/kg (Calculated) : 63.9 HEPARIN  DW (KG): 95.1  Vital Signs: Temp: 97.5 F (36.4 C) (10/05 1945) Temp Source: Oral (10/05 1945) BP: 106/48 (10/05 1945) Pulse Rate: 73 (10/05 1630)  Labs: Recent Labs    11/19/23 0712 11/19/23 9287 11/19/23 0714 11/19/23 1518 11/20/23 0705 11/20/23 0706 11/20/23 1056 11/20/23 2206 11/21/23 0709 11/21/23 0712 11/21/23 2200  HGB 12.3  --   --   --  10.3*  --   --   --  11.4*  --   --   HCT 38.5  --   --   --  32.5*  --   --   --  35.4*  --   --   PLT 194  --   --   --  172  --   --   --  190  --   --   APTT  --    < > 80*  --  145*  --    < > 58* 105*  --  178*  HEPARINUNFRC  --   --  0.99*  --   --  0.81*  --   --   --  0.76*  --   CREATININE 1.50*  --   --  1.48* 1.63*  --   --   --  1.62*  --   --    < > = values in this interval not displayed.   Estimated Creatinine Clearance: 41.7 mL/min (A) (by C-G formula based on SCr of 1.62 mg/dL (H)).  Assessment: 73 y.o. female with h/o DVT, Eliquis  on hold, for heparin .  aPTT remains labile while obtaining aPTTs from PICC  Goal of Therapy:  Heparin  level 0.3-0.7 units/ml aPTT 66-102 seconds Monitor platelets by anticoagulation protocol: Yes   Plan:  No change to heparin   aPTT and heparin  level via peripheral stick--follow-up am labs.   Cathlyn Arrant, PharmD, BCPS  11/21/2023 10:58 PM

## 2023-11-21 NOTE — Progress Notes (Signed)
 The night nurse changed PICC line dressing it bled through again, consulted PICC team and they said not to change because it will pull loose the clot and bleed again. The microbial disc in place.

## 2023-11-21 NOTE — Plan of Care (Signed)
  Problem: Health Behavior/Discharge Planning: Goal: Ability to identify and utilize available resources and services will improve Outcome: Progressing   Problem: Metabolic: Goal: Ability to maintain appropriate glucose levels will improve Outcome: Progressing   Problem: Nutritional: Goal: Maintenance of adequate nutrition will improve Outcome: Progressing Goal: Progress toward achieving an optimal weight will improve Outcome: Progressing   Problem: Skin Integrity: Goal: Risk for impaired skin integrity will decrease Outcome: Progressing   Problem: Clinical Measurements: Goal: Respiratory complications will improve Outcome: Progressing Goal: Cardiovascular complication will be avoided Outcome: Progressing   Problem: Activity: Goal: Risk for activity intolerance will decrease Outcome: Progressing   Problem: Safety: Goal: Ability to remain free from injury will improve Outcome: Progressing   Problem: Cardiac: Goal: Ability to achieve and maintain adequate cardiopulmonary perfusion will improve Outcome: Progressing

## 2023-11-21 NOTE — Plan of Care (Signed)

## 2023-11-21 NOTE — Progress Notes (Signed)
 PHARMACY - ANTICOAGULATION  Pharmacy Consult for heparin  Indication: hx DVT Brief A/P: aPTT subtherapeutic Increase Heparin  rate  Allergies  Allergen Reactions   Tetracyclines & Related Anaphylaxis and Rash   Banana Hives and Nausea And Vomiting   Jardiance  [Empagliflozin ]     Yeast infections, fatigue   Penicillins Rash and Other (See Comments)   Patient Measurements: Height: 5' 8 (172.7 cm) Weight: 117.6 kg (259 lb 4.2 oz) IBW/kg (Calculated) : 63.9 HEPARIN  DW (KG): 95.1  Vital Signs: Temp: 97.6 F (36.4 C) (10/05 0700) Temp Source: Oral (10/05 0700) BP: 113/74 (10/05 0700) Pulse Rate: 82 (10/05 0700)  Labs: Recent Labs    11/19/23 0712 11/19/23 0714 11/19/23 1518 11/20/23 0705 11/20/23 0706 11/20/23 1056 11/20/23 2000 11/20/23 2206 11/21/23 0709 11/21/23 0712  HGB 12.3  --   --  10.3*  --   --   --   --  11.4*  --   HCT 38.5  --   --  32.5*  --   --   --   --  35.4*  --   PLT 194  --   --  172  --   --   --   --  190  --   APTT  --  80*  --  145*  --    < > >200* 58* 105*  --   HEPARINUNFRC  --  0.99*  --   --  0.81*  --   --   --   --  0.76*  CREATININE 1.50*  --  1.48* 1.63*  --   --   --   --  1.62*  --    < > = values in this interval not displayed.   Estimated Creatinine Clearance: 41.7 mL/min (A) (by C-G formula based on SCr of 1.62 mg/dL (H)).  Assessment: 73 y.o. female with history of DVT taking Eliquis  PTA. Pharmacy consulted to dose heparin  in patient with history of DVT.  Tentatively planning for [facility] on Monday (depending on on how diuresis goes over weekend) and utilizing heparin  drip while holding PTA Eliquis  in meantime. Last dose of Eliquis  PTA was 10/01 at 0830.  Dosing based on aPTT until correlation with heparin  levels. Patient previously subtherapeutic on 750 units/h, but supratherapeutic on 1050 units/h.   At 10/4 10:15 called lab because aPTT which was collected at 07:15 had not resulted. Lab confirmed the aPTT as 105, which is slightly  therapeutic. Heparin  currently running at 1000 units/h with no issues documented. Hgb stable and platelets WNL. No issues with bleeding documented.   Goal of Therapy:  Heparin  level 0.3-0.7 units/ml aPTT 66-102 seconds Monitor platelets by anticoagulation protocol: Yes   Plan:  Decrease heparin  drip to 900 units/h.  Check aPTT in 8h.  Monitor aPTT, heparin  levels, CBC daily.   Maurilio Patten, PharmD PGY1 Pharmacy Resident [facility]  11/21/2023 10:21 AM

## 2023-11-21 NOTE — Progress Notes (Signed)
 Rounding Note   Patient Name: Stephanie [name] Date of Encounter: 11/21/2023  [facility] Cardiologist: Jayson Sierras, MD   Subjective  Feeling a lot better today and able to lay flat. Not back to baseline yet however.   Scheduled Meds:  arformoterol  15 mcg Nebulization BID   And   umeclidinium bromide  1 puff Inhalation Daily   vitamin C   500 mg Oral Daily   atorvastatin   80 mg Oral Daily   bisoprolol   10 mg Oral Daily   Chlorhexidine  Gluconate Cloth  6 each Topical Daily   cycloSPORINE  1 drop Both Eyes BID   DULoxetine   60 mg Oral BID   insulin  aspart  0-15 Units Subcutaneous TID WC   insulin  aspart  0-5 Units Subcutaneous QHS   insulin  glargine  8 Units Subcutaneous QHS   levothyroxine   100 mcg Oral QAC breakfast   loratadine   10 mg Oral Daily   pantoprazole   40 mg Oral Daily   potassium chloride   40 mEq Oral Daily   pregabalin   75 mg Oral BID   sacubitril-valsartan  1 tablet Oral BID   sodium chloride  flush  10-40 mL Intracatheter Q12H   traZODone   150 mg Oral QHS   ursodiol  300 mg Oral BID   Continuous Infusions:  furosemide  120 mg (11/21/23 1102)   heparin  900 Units/hr (11/21/23 1105)   PRN Meds: acetaminophen  **OR** acetaminophen , HYDROmorphone  (DILAUDID ) injection, hydrOXYzine , ipratropium-albuterol , lubiprostone , methocarbamol , ondansetron  **OR** ondansetron  (ZOFRAN ) IV, sodium chloride  flush, traMADol    Vital Signs  Vitals:   11/20/23 2121 11/21/23 0014 11/21/23 0425 11/21/23 0700  BP:  106/78 116/71 113/74  Pulse: 83 (!) 44 87 82  Resp: 16 18 18 18   Temp:  97.6 F (36.4 C) (!) 97.5 F (36.4 C) 97.6 F (36.4 C)  TempSrc:  Oral Oral Oral  SpO2: 99% 94% 100% 98%  Weight:   117.6 kg   Height:        Intake/Output Summary (Last 24 hours) at 11/21/2023 1216 Last data filed at 11/21/2023 0531 Gross per 24 hour  Intake 964.57 ml  Output 900 ml  Net 64.57 ml      11/21/2023    4:25 AM 11/20/2023    3:56 AM 11/19/2023    4:34 AM  Last 3  Weights  Weight (lbs) 259 lb 4.2 oz 262 lb 9.1 oz 268 lb 4.8 oz  Weight (kg) 117.6 kg 119.1 kg 121.7 kg      Telemetry Sinus rhythm with PACs - Personally reviewed  ECG  No new tracings - Personally Reviewed  Physical Exam Vitals and nursing note reviewed.  Constitutional:      Appearance: Normal appearance.  HENT:     Head: Normocephalic and atraumatic.  Eyes:     Conjunctiva/sclera: Conjunctivae normal.  Neck:     Vascular: JVD present.     Comments: JVD to mid neck Cardiovascular:     Rate and Rhythm: Normal rate. Rhythm irregular.  Pulmonary:     Effort: Pulmonary effort is normal.     Breath sounds: Normal breath sounds.  Musculoskeletal:        General: No tenderness.     Comments: Bilateral lower extremity swelling with tense and tender skin  Skin:    Coloration: Skin is not jaundiced or pale.  Neurological:     Mental Status: She is alert.      Labs High Sensitivity Troponin:  No results for input(s): TROPONINIHS in the last 720 hours.  Chemistry Recent Labs  Lab 11/16/23 1201 11/16/23 1325 11/17/23 0427 11/18/23 1745 11/19/23 0712 11/19/23 0900 11/19/23 1518 11/20/23 0705 11/21/23 0709  NA 130*  --    < > 132*  131*   < >  --  130* 134* 134*  K 4.5  --    < > 3.9  3.9   < >  --  4.7 3.5 3.6  CL 95*  --    < > 99  98   < >  --  98 100 98  CO2 21*  --    < > 26  26   < >  --  24 27 28   GLUCOSE 159*  --    < > 145*  143*   < >  --  138* 191* 72  BUN 25*  --    < > 27*  28*   < >  --  26* 27* 28*  CREATININE 1.34*  --    < > 1.58*  1.54*   < >  --  1.48* 1.63* 1.62*  CALCIUM  9.6  --    < > 8.7*  8.5*   < >  --  8.6* 8.5* 8.8*  MG  --  1.6*  --  1.6*  1.7  --  1.6*  --   --   --   PROT 9.1*  --   --   --   --   --   --   --   --   ALBUMIN 3.3*  --   --   --   --   --   --   --   --   AST 39  --   --   --   --   --   --   --   --   ALT 30  --   --   --   --   --   --   --   --   ALKPHOS 410*  --   --   --   --   --   --   --   --    BILITOT 3.1*  --   --   --   --   --   --   --   --   GFRNONAA 42*  --    < > 34*  35*   < >  --  37* 33* 33*  ANIONGAP 14  --    < > 7  7   < >  --  8 7 8    < > = values in this interval not displayed.    Lipids  Recent Labs  Lab 11/18/23 1745  CHOL 113  TRIG 42  HDL 41  LDLCALC 64  CHOLHDL 2.8    Hematology Recent Labs  Lab 11/19/23 0712 11/20/23 0705 11/21/23 0709  WBC 6.9 8.2 8.4  RBC 4.54 3.83* 4.20  HGB 12.3 10.3* 11.4*  HCT 38.5 32.5* 35.4*  MCV 84.8 84.9 84.3  MCH 27.1 26.9 27.1  MCHC 31.9 31.7 32.2  RDW 15.7* 15.4 15.4  PLT 194 172 190   Thyroid   Recent Labs  Lab 11/16/23 1335 11/17/23 0857  TSH 6.730*  --   FREET4  --  2.40*    BNP Recent Labs  Lab 11/16/23 1201 11/18/23 1745  BNP  --  2,302.1*  PROBNP 14,798.0*  --     DDimer No results for input(s): DDIMER in the last 168 hours.  Radiology  No results found.   Cardiac Studies  Echo 11/17/23:  1. Left ventricular ejection fraction, by estimation, is 35 to 40%. Left  ventricular ejection fraction by 3D volume is 33 %. The left ventricle has  moderately decreased function. The left ventricle demonstrates regional  wall motion abnormalities (see  scoring diagram/findings for description). The left ventricular internal  cavity size was mildly dilated. Left ventricular diastolic parameters are  indeterminate.   2. Right ventricular systolic function is mildly reduced. The right  ventricular size is severely enlarged. Moderately increased right  ventricular wall thickness. There is normal pulmonary artery systolic  pressure. The estimated right ventricular  systolic pressure is 31.8 mmHg.   3. Large pleural effusion.   4. The mitral valve is normal in structure. Mild mitral valve  regurgitation. No evidence of mitral stenosis.   5. The aortic valve is tricuspid. Aortic valve regurgitation is not  visualized. No aortic stenosis is present.   6. The inferior vena cava is dilated in  size with >50% respiratory  variability, suggesting right atrial pressure of 8 mmHg.   Patient Profile    73 y.o. 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 Cardiology is following for evaluation of decompensated CHF.   Assessment & Plan   Acute on chronic systolic heart failure Anasarca  - weight continues to trend down, currently 117 kg (130 kg on admit), net - 5.5 L - renal function stable compared to yesterday - prior LVEF 40% in 05/2023 - echo this admission with LVEF 35-40% with RWMA, mildly reduced RV function, severe RV enlargement with normal PASP, large pericardial effusion  - lasix  drip initially but changed to bolus dosing  - Creatinine 1.48->1.63 -- stable today - GDMT: no SGLT2i due to fatigue and frequent yeast infections, continue 10 mg bisoprolol , 24-26 mg entresto  - consider adding spironolactone prior to discharge - continue 120 mg IV lasix  BID and add K supplement  - Able to lay flat. Plan tentatively for R/L [facility] tomorrow if able to be scheduled. Will make NPO after midnight in anticipation.    DVT - holding home eliquis  pending likely heart cath on Monday with adequate diuresis - telemetry appears sinus rhythm with PACs, low voltage p wave  Hypertension - managed in the context of HFrEF  Myalgias per primary team  Hyperlipidemia with LDL goal < 70 11/18/2023: Cholesterol 113; HDL 41; LDL Cholesterol 64; Triglycerides 42; VLDL 8 Continue statin - 80 mg lipitor         For questions or updates, please contact [facility] Please consult [email or web address] for contact info under       Signed, Emeline FORBES Calender, MD  11/21/2023, 12:16 PM

## 2023-11-21 NOTE — TOC Progression Note (Signed)
 Transition of Care [facility]) - Progression Note    Patient Details  Name: Stephanie [name] MRN: [id number] Date of Birth: 11-17-1950  [facility]) CM/SW Contact  Isaiah Public, LCSWA Phone Number: 11/21/2023, 9:37 AM  Clinical Narrative:     Patient has SNF bed at [facility]. Patients passr pending. CSW following to start insurance authorization closer to patient being medically ready.CSW will continue to follow.  Expected Discharge Plan:  (TBD) Barriers to Discharge: Continued Medical Work up               Expected Discharge Plan and Services In-house Referral: Clinical Social Work     Living arrangements for the past 2 months: Single Family Home                                       Social Drivers of Health (SDOH) Interventions SDOH Screenings   Food Insecurity: No Food Insecurity (11/17/2023)  Housing: Low Risk  (11/17/2023)  Transportation Needs: No Transportation Needs (11/17/2023)  Utilities: Not At Risk (11/17/2023)  Depression (PHQ2-9): Low Risk  (09/14/2023)  Social Connections: Moderately Isolated (11/17/2023)  Tobacco Use: Medium Risk (11/16/2023)    Readmission Risk Interventions     No data to display

## 2023-11-22 DIAGNOSIS — I509 Heart failure, unspecified: Secondary | ICD-10-CM | POA: Diagnosis not present

## 2023-11-22 DIAGNOSIS — I5023 Acute on chronic systolic (congestive) heart failure: Secondary | ICD-10-CM | POA: Diagnosis not present

## 2023-11-22 LAB — CBC
HCT: 32.3 % — ABNORMAL LOW (ref 36.0–46.0)
Hemoglobin: 10.3 g/dL — ABNORMAL LOW (ref 12.0–15.0)
MCH: 27.1 pg (ref 26.0–34.0)
MCHC: 31.9 g/dL (ref 30.0–36.0)
MCV: 85 fL (ref 80.0–100.0)
Platelets: 166 K/uL (ref 150–400)
RBC: 3.8 MIL/uL — ABNORMAL LOW (ref 3.87–5.11)
RDW: 15.4 % (ref 11.5–15.5)
WBC: 7.5 K/uL (ref 4.0–10.5)
nRBC: 0 % (ref 0.0–0.2)

## 2023-11-22 LAB — COMPREHENSIVE METABOLIC PANEL WITH GFR
ALT: 19 U/L (ref 0–44)
AST: 28 U/L (ref 15–41)
Albumin: 2 g/dL — ABNORMAL LOW (ref 3.5–5.0)
Alkaline Phosphatase: 174 U/L — ABNORMAL HIGH (ref 38–126)
Anion gap: 9 (ref 5–15)
BUN: 29 mg/dL — ABNORMAL HIGH (ref 8–23)
CO2: 26 mmol/L (ref 22–32)
Calcium: 8.3 mg/dL — ABNORMAL LOW (ref 8.9–10.3)
Chloride: 96 mmol/L — ABNORMAL LOW (ref 98–111)
Creatinine, Ser: 1.85 mg/dL — ABNORMAL HIGH (ref 0.44–1.00)
GFR, Estimated: 28 mL/min — ABNORMAL LOW (ref >60)
Glucose, Bld: 89 mg/dL (ref 70–99)
Potassium: 3.6 mmol/L (ref 3.5–5.1)
Sodium: 131 mmol/L — ABNORMAL LOW (ref 135–145)
Total Bilirubin: 2 mg/dL — ABNORMAL HIGH (ref 0.0–1.2)
Total Protein: 6.9 g/dL (ref 6.5–8.1)

## 2023-11-22 LAB — APTT: aPTT: 117 s — ABNORMAL HIGH (ref 24–36)

## 2023-11-22 LAB — MAGNESIUM: Magnesium: 2 mg/dL (ref 1.7–2.4)

## 2023-11-22 LAB — GLUCOSE, CAPILLARY
Glucose-Capillary: 84 mg/dL (ref 70–99)
Glucose-Capillary: 82 mg/dL (ref 70–99)
Glucose-Capillary: 81 mg/dL (ref 70–99)
Glucose-Capillary: 120 mg/dL — ABNORMAL HIGH (ref 70–99)

## 2023-11-22 LAB — HEPARIN LEVEL (UNFRACTIONATED): Heparin Unfractionated: 0.61 [IU]/mL (ref 0.30–0.70)

## 2023-11-22 MED ORDER — ALUM & MAG HYDROXIDE-SIMETH 200-200-20 MG/5ML PO SUSP
30.0000 mL | ORAL | Status: DC | PRN
  Administered 2023-11-22 – 2023-11-25 (×2): 30 mL via ORAL
  Filled 2023-11-22 (×3): qty 30

## 2023-11-22 NOTE — Progress Notes (Signed)
 Rounding Note   Patient Name: Stephanie [name] Date of Encounter: 11/22/2023  [facility] Cardiologist: Jayson Sierras, MD   Subjective  Generalized myalgias. Has RUE PIC line for labs. Morbidly obese with limited mobility  Scheduled Meds:  arformoterol  15 mcg Nebulization BID   And   umeclidinium bromide  1 puff Inhalation Daily   vitamin C   500 mg Oral Daily   atorvastatin   80 mg Oral Daily   bisoprolol   10 mg Oral Daily   Chlorhexidine  Gluconate Cloth  6 each Topical Daily   cycloSPORINE  1 drop Both Eyes BID   DULoxetine   60 mg Oral BID   insulin  aspart  0-15 Units Subcutaneous TID WC   insulin  aspart  0-5 Units Subcutaneous QHS   insulin  glargine  8 Units Subcutaneous QHS   levothyroxine   100 mcg Oral QAC breakfast   loratadine   10 mg Oral Daily   pantoprazole   40 mg Oral Daily   potassium chloride   40 mEq Oral Daily   pregabalin   75 mg Oral BID   sacubitril-valsartan  1 tablet Oral BID   sodium chloride  flush  10-40 mL Intracatheter Q12H   traZODone   150 mg Oral QHS   ursodiol  300 mg Oral BID   Continuous Infusions:  furosemide  120 mg (11/21/23 1858)   heparin  900 Units/hr (11/21/23 1629)   PRN Meds: acetaminophen  **OR** acetaminophen , HYDROmorphone  (DILAUDID ) injection, hydrOXYzine , ipratropium-albuterol , lubiprostone , methocarbamol , ondansetron  **OR** ondansetron  (ZOFRAN ) IV, sodium chloride  flush, traMADol    Vital Signs  Vitals:   11/22/23 0000 11/22/23 0017 11/22/23 0521 11/22/23 0746  BP:  (!) 106/57 110/64 105/66  Pulse: 71 75 73   Resp: 13 13 13 15   Temp:  (!) 97.5 F (36.4 C) (!) 97.3 F (36.3 C) 97.8 F (36.6 C)  TempSrc:  Oral Oral Oral  SpO2: 97% 100% 100%   Weight:   117.2 kg   Height:        Intake/Output Summary (Last 24 hours) at 11/22/2023 0755 Last data filed at 11/22/2023 0527 Gross per 24 hour  Intake 250 ml  Output 420 ml  Net -170 ml      11/22/2023    5:21 AM 11/21/2023    4:25 AM 11/20/2023    3:56 AM  Last 3  Weights  Weight (lbs) 258 lb 6.1 oz 259 lb 4.2 oz 262 lb 9.1 oz  Weight (kg) 117.2 kg 117.6 kg 119.1 kg      Telemetry Sinus rhythm with PACs - Personally reviewed  ECG  No new tracings - Personally Reviewed  Morbidly obese black female Tender swollen LUE PIC line in RUE Basilar atelectasis  Distant heart sounds Anasarca tense up to thighs plus 3 edema Good right radial pulse for cath   Labs High Sensitivity Troponin:  No results for input(s): TROPONINIHS in the last 720 hours.   Chemistry Recent Labs  Lab 11/16/23 1201 11/16/23 1325 11/19/23 0900 11/19/23 1518 11/20/23 0705 11/21/23 0709 11/21/23 1442 11/22/23 0652  NA 130*   < >  --    < > 134* 134*  --  131*  K 4.5   < >  --    < > 3.5 3.6  --  3.6  CL 95*   < >  --    < > 100 98  --  96*  CO2 21*   < >  --    < > 27 28  --  26  GLUCOSE 159*   < >  --    < >  191* 72  --  89  BUN 25*   < >  --    < > 27* 28*  --  29*  CREATININE 1.34*   < >  --    < > 1.63* 1.62*  --  1.85*  CALCIUM  9.6   < >  --    < > 8.5* 8.8*  --  8.3*  MG  --    < > 1.6*  --   --   --  1.7 2.0  PROT 9.1*  --   --   --   --   --   --  6.9  ALBUMIN 3.3*  --   --   --   --   --   --  2.0*  AST 39  --   --   --   --   --   --  28  ALT 30  --   --   --   --   --   --  19  ALKPHOS 410*  --   --   --   --   --   --  174*  BILITOT 3.1*  --   --   --   --   --   --  2.0*  GFRNONAA 42*   < >  --    < > 33* 33*  --  28*  ANIONGAP 14   < >  --    < > 7 8  --  9   < > = values in this interval not displayed.    Lipids  Recent Labs  Lab 11/18/23 1745  CHOL 113  TRIG 42  HDL 41  LDLCALC 64  CHOLHDL 2.8    Hematology Recent Labs  Lab 11/20/23 0705 11/21/23 0709 11/22/23 0652  WBC 8.2 8.4 7.5  RBC 3.83* 4.20 3.80*  HGB 10.3* 11.4* 10.3*  HCT 32.5* 35.4* 32.3*  MCV 84.9 84.3 85.0  MCH 26.9 27.1 27.1  MCHC 31.7 32.2 31.9  RDW 15.4 15.4 15.4  PLT 172 190 166   Thyroid   Recent Labs  Lab 11/16/23 1335 11/17/23 0857  TSH 6.730*  --    FREET4  --  2.40*    BNP Recent Labs  Lab 11/16/23 1201 11/18/23 1745  BNP  --  2,302.1*  PROBNP 14,798.0*  --     DDimer No results for input(s): DDIMER in the last 168 hours.   Radiology  No results found.   Cardiac Studies  Echo 11/17/23:  1. Left ventricular ejection fraction, by estimation, is 35 to 40%. Left  ventricular ejection fraction by 3D volume is 33 %. The left ventricle has  moderately decreased function. The left ventricle demonstrates regional  wall motion abnormalities (see  scoring diagram/findings for description). The left ventricular internal  cavity size was mildly dilated. Left ventricular diastolic parameters are  indeterminate.   2. Right ventricular systolic function is mildly reduced. The right  ventricular size is severely enlarged. Moderately increased right  ventricular wall thickness. There is normal pulmonary artery systolic  pressure. The estimated right ventricular  systolic pressure is 31.8 mmHg.   3. Large pleural effusion.   4. The mitral valve is normal in structure. Mild mitral valve  regurgitation. No evidence of mitral stenosis.   5. The aortic valve is tricuspid. Aortic valve regurgitation is not  visualized. No aortic stenosis is present.   6. The inferior vena cava is dilated in size with >50% respiratory  variability,  suggesting right atrial pressure of 8 mmHg.   Patient Profile    73 y.o. 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 Cardiology is following for evaluation of decompensated CHF.   Assessment & Plan   Acute on chronic systolic heart failure Anasarca  - continue lasix  drip Cr < 2 for cath at 1.85 no LV limited contrast  - prior LVEF 40% in 05/2023 - echo this admission with LVEF 35-40% with RWMA, mildly reduced RV function, severe RV enlargement  with normal PASP, large pericardial effusion  - lasix  120 iv bid bolus dosing  - Creatinine 1.85 - GDMT: no SGLT2i due to fatigue and frequent yeast infections, continue 10 mg bisoprolol , 24-26 mg entresto  - continue 120 mg IV lasix  BID and add K supplement  - Able to lay flat. Still with gross anasarca for right/left cath today per Dr Kriste    DVT - holding home eliquis  pending cath -  continue heparin    Hypertension - managed in the context of HFrEF  Myalgias per primary team  Hyperlipidemia with LDL goal < 70 11/18/2023: Cholesterol 113; HDL 41; LDL Cholesterol 64; Triglycerides 42; VLDL 8 Continue statin - 80 mg lipitor     For questions or updates, please contact [facility] Please consult [email or web address] for contact info under       Signed, Maude Emmer, MD  11/22/2023, 7:55 AM

## 2023-11-22 NOTE — TOC Progression Note (Signed)
 Transition of Care [facility]) - Progression Note    Patient Details  Name: Stephanie [name] MRN: [id number] Date of Birth: 1951-01-20  Transition of Care [facility]) CM/SW Contact  Isaiah Public, LCSWA Phone Number: 11/22/2023, 8:57 AM  Clinical Narrative:     Patients passr has been approved [id number]. CSW following to start insurance authorization for patient. Patient has SNF bed at [facility]   Expected Discharge Plan:  (TBD) Barriers to Discharge: Continued Medical Work up               Expected Discharge Plan and Services In-house Referral: Clinical Social Work     Living arrangements for the past 2 months: Single Family Home                                       Social Drivers of Health (SDOH) Interventions SDOH Screenings   Food Insecurity: No Food Insecurity (11/17/2023)  Housing: Low Risk  (11/17/2023)  Transportation Needs: No Transportation Needs (11/17/2023)  Utilities: Not At Risk (11/17/2023)  Depression (PHQ2-9): Low Risk  (09/14/2023)  Social Connections: Moderately Isolated (11/17/2023)  Tobacco Use: Medium Risk (11/16/2023)    Readmission Risk Interventions     No data to display

## 2023-11-22 NOTE — Progress Notes (Signed)
 [name], female   DOB: 07/03/1950, 73 y.o.   MRN: [id number] Night shift RN drew APTT from PICC. Level was 178. Pharmacist asked it be verified with a phlebotomy draw from the left arm this morning. Cardiology informed phlebotomy of cath today and did not want it drawn. Spoke with physician, Dr. Buren, pharmacist and cath lab to make sure all were aware. Cath to be this afternoon..  [name] D [name], RN

## 2023-11-22 NOTE — Progress Notes (Signed)
 Progress Note   Patient: Stephanie [name] FMW:[id number] DOB: 1950/12/17 DOA: 11/16/2023     6 DOS: the patient was seen and examined on 11/22/2023   Brief hospital course: 73 y.o. female with medical history significant of systolic heart failure, hypothyroidism, hyperlipidemia, COPD/asthma, history of DVT, fibromyalgia, GERD and type 2 diabetes mellitus with nephropathy; who presented to the hospital secondary to increased lower extremity swelling, shortness of breath with activity/orthopnea and weight gain.  Patient reports symptom has been present for the last 3 to 4 days and worsening; she was scheduled to be seen by cardiology service as an outpatient but unfortunately due to worsening of her symptoms did not make it and presented to the emergency department instead.     Of note, patient reports to be compliant with her medications.   ED:  Demonstrating elevated BNP, signs of fluid overload on physical exam and a chest x-ray with vascular congestion/interstitial edema.  IV Lasix  was started  Assessment and Plan: Acute on chronic systolic heart failure - Hemodynamically stable, satting 100% on 1 L of oxygen  -Repeat 2d echo with EF 35-40% with regional WMA, mildly reduced RV function and severely enlarged RV size  - h/o EJF around 40% on previous echo. - Presenting with complaint of weight gain, orthopnea, lower extremity swelling and elevated BNP -presenting BNP 14,798.0 - Cardiology following, cont 120mg  IV lasix  bid - Continue Entresto , Bisoprolol ,   -continued on heparin  gtt. Pt planned for LHC/RHC today   COPD/asthma - Appears to be stable and currently no signs of acute exacerbation - Continue bronchodilator management.   Type 2 diabetes with nephropathy - Update A1c - Checking CBG q. ACHS, SSI coverage   Acute on Chronic kidney disease stage IIIb  - With a slightly increase in patient's creatinine from recent values most likely in the setting of decreased perfusion with CHF  exacerbation -pt continues to void well   Hypertension - Stable on Lasix  drip, Entresto  - Continue current antihypertensive agent -Holding home medication spironolactone   GERD - Continue PPI   Hypothyroidism - TSH: 6.73 -Checking free T3, T4 - Continue Synthroid .   Depression/anxiety/neuropathy and Fibromyalgia - Continue Cymbalta  and Atarax  - Continue Lyrica  and Robaxin  - Complained of increased pain this AM. Added PRN IV dilaudid  for breakthrough    History of DVT and prior CVA - No acute neurologic deficit - Continue heparin  gtt for now    Hyperlipidemia - Resume outpatient Repatha  after discharge  Constipation -Pt reports good results overnight and this AM with cathartics   Subjective: Without complaints this AM  Physical Exam: Vitals:   11/22/23 0746 11/22/23 0750 11/22/23 1146 11/22/23 1547  BP: 105/66  99/73   Pulse:      Resp: 15     Temp: 97.8 F (36.6 C)  (!) 97.5 F (36.4 C) (!) 97.4 F (36.3 C)  TempSrc: Oral  Oral Oral  SpO2:  95%    Weight:      Height:       General exam: Awake, laying in bed, in nad Respiratory system: Normal respiratory effort, no wheezing Cardiovascular system: regular rate, s1, s2 Gastrointestinal system: Soft, nondistended, positive BS Central nervous system: CN2-12 grossly intact, strength intact Extremities: Perfused, no clubbing Skin: Normal skin turgor, no notable skin lesions seen Psychiatry: Mood normal // no visual hallucinations   Data Reviewed:  Labs reviewed: Na 131, K 3.6, Cr 1.85, Alk phos 174, WBC 7.5, Hgb 10.3, Plts 166  Family Communication: Pt in room, family not  at bedside  Disposition: Status is: Inpatient Remains inpatient appropriate because: severity of illness  Planned Discharge Destination: Home and Skilled nursing facility    Author: Garnette Pelt, MD 11/22/2023 4:42 PM  For on call review [email or web address].

## 2023-11-22 NOTE — Progress Notes (Signed)
 Physical Therapy Treatment Patient Details Name: Stephanie [name] MRN: [id number] DOB: 1950/02/25 Today's Date: 11/22/2023   History of Present Illness 73 y.o. female who presented 9/30 with lower extremity swelling, SOB, and weight gain. Dx acute on chronic systolic heart failure. PMH includes: systolic heart failure, hypothyroidism, hyperlipidemia, COPD/asthma, history of DVT, fibromyalgia, GERD and type 2 diabetes mellitus with nephropathy.    PT Comments  Pt agreeable to session with focus on progressing mobility prior to cath later this afternoon. Session primarily limited by significant pain in back and onset of LE spasm when attempting OOB mobility. Pt frequently needing repositioning of trunk in increased extension to relieve spasm, and unable to manage OOB transfers at this time. Pt able to assist with bed mobility using UE and LE, but remains dependent on bed features. Will continue to benefit from skilled PT acutely to progress functional strength, stability, activity tolerance, and power.    If plan is discharge home, recommend the following: A lot of help with bathing/dressing/bathroom;Assistance with cooking/housework;Assist for transportation;Help with stairs or ramp for entrance;Two people to help with walking and/or transfers   Can travel by private vehicle     No  Equipment Recommendations  Berry lift;Hospital bed    Recommendations for Other Services       Precautions / Restrictions Precautions Precautions: Fall Recall of Precautions/Restrictions: Intact Restrictions Weight Bearing Restrictions Per Provider Order: No     Mobility  Bed Mobility Overal bed mobility: Needs Assistance Bed Mobility: Supine to Sit, Sit to Supine     Supine to sit: Mod assist, HOB elevated, Used rails Sit to supine: Mod assist   General bed mobility comments: Mod assist for trunk support to rise, cues for rail use as needed. Mod assist for LE support back into bed. Able to scoot herself  up with UEs on rails and bed in trendelenburg.    Transfers                   General transfer comment: pt unable due to onset of spasms once EOB    Ambulation/Gait               General Gait Details: Deferred for safety, need +2     Balance Overall balance assessment: Needs assistance Sitting-balance support: Feet supported, Bilateral upper extremity supported Sitting balance-Leahy Scale: Poor Sitting balance - Comments: dependent on BUE support and intermittently leaning backwards to relieve back and LE pain                                    Communication Communication Communication: No apparent difficulties  Cognition Arousal: Alert Behavior During Therapy: WFL for tasks assessed/performed   PT - Cognitive impairments: No apparent impairments                       PT - Cognition Comments: slow to respond at times, but able to follow commands in session Following commands: Intact      Cueing Cueing Techniques: Verbal cues, Gestural cues  Exercises      General Comments General comments (skin integrity, edema, etc.): VSS on 2L O2      Pertinent Vitals/Pain Pain Assessment Pain Assessment: 0-10 Pain Score: 10-Worst pain ever Pain Location: everywhere but especially her low back, LE spasms Pain Descriptors / Indicators: Aching, Discomfort, Grimacing, Spasm Pain Intervention(s): Limited activity within patient's tolerance, Monitored during session, Heat applied  PT Goals (current goals can now be found in the care plan section) Acute Rehab PT Goals Patient Stated Goal: Get well PT Goal Formulation: With patient Time For Goal Achievement: 12/02/23 Potential to Achieve Goals: Good Progress towards PT goals: Not progressing toward goals - comment (more limited by pain today)    Frequency    Min 2X/week       AM-PAC PT 6 Clicks Mobility   Outcome Measure  Help needed turning from your back to your side while  in a flat bed without using bedrails?: A Little Help needed moving from lying on your back to sitting on the side of a flat bed without using bedrails?: A Lot Help needed moving to and from a bed to a chair (including a wheelchair)?: A Lot Help needed standing up from a chair using your arms (e.g., wheelchair or bedside chair)?: A Lot Help needed to walk in hospital room?: Total Help needed climbing 3-5 steps with a railing? : Total 6 Click Score: 11    End of Session Equipment Utilized During Treatment: Oxygen  Activity Tolerance: Patient tolerated treatment well;Patient limited by pain Patient left: in bed;with call bell/phone within reach;with bed alarm set Nurse Communication: Mobility status PT Visit Diagnosis: Unsteadiness on feet (R26.81);Other abnormalities of gait and mobility (R26.89);Muscle weakness (generalized) (M62.81);Difficulty in walking, not elsewhere classified (R26.2)     Time: 8895-8875 PT Time Calculation (min) (ACUTE ONLY): 20 min  Charges:    $Therapeutic Activity: 8-22 mins PT General Charges $$ ACUTE PT VISIT: 1 Visit                     Izetta Call, PT, DPT   Acute Rehabilitation Department Office [phone] Secure Chat Communication Preferred   Izetta JULIANNA Call 11/22/2023, 12:39 PM

## 2023-11-22 NOTE — Progress Notes (Signed)
 PHARMACY - ANTICOAGULATION  Pharmacy Consult for heparin  (Eliquis  PTA) Indication: hx DVT   Allergies  Allergen Reactions   Tetracyclines & Related Anaphylaxis and Rash   Banana Hives and Nausea And Vomiting   Jardiance  [Empagliflozin ]     Yeast infections, fatigue   Penicillins Rash and Other (See Comments)   Patient Measurements: Height: 5' 8 (172.7 cm) Weight: 117.2 kg (258 lb 6.1 oz) IBW/kg (Calculated) : 63.9 HEPARIN  DW (KG): 95.1  Vital Signs: Temp: 97.5 F (36.4 C) (10/06 2034) Temp Source: Oral (10/06 2034) BP: 128/67 (10/06 2034)  Labs: Recent Labs    11/20/23 0705 11/20/23 0706 11/20/23 1056 11/20/23 2206 11/21/23 0709 11/21/23 0712 11/21/23 2200 11/22/23 0652 11/22/23 1900  HGB 10.3*  --   --   --  11.4*  --   --  10.3*  --   HCT 32.5*  --   --   --  35.4*  --   --  32.3*  --   PLT 172  --   --   --  190  --   --  166  --   APTT 145*  --    < > 58* 105*  --  178*  --   --   HEPARINUNFRC  --  0.81*  --   --   --  0.76*  --   --  0.61  CREATININE 1.63*  --   --   --  1.62*  --   --  1.85*  --    < > = values in this interval not displayed.   Estimated Creatinine Clearance: 36.4 mL/min (A) (by C-G formula based on SCr of 1.85 mg/dL (H)).  Assessment: 73 y.o. female with history of bilateral DVTs in 2017 taking Eliquis  PTA (last dose 10/1 AM). Pharmacy consulted to dose heparin  pending cath.  10/6 PM - planned LHC today deferred due to emergent STEMI cases.  Heparin  level 0.61 is therapeutic and trending down, aPTT 117 sec above goal - drawn peripherally.  RN unaware of where level was drawn but no overt s/sx bleeding.    Goal of Therapy:  Heparin  level 0.3-0.7 units/ml aPTT 66-102 seconds Monitor platelets by anticoagulation protocol: Yes   Plan:  Decrease heparin  drip to 850 units/h.  8h aPTT Monitor aPTT, heparin  levels, CBC daily.  F/u plans for [facility]  Maurilio Fila, PharmD Clinical Pharmacist 11/22/2023  10:07 PM

## 2023-11-22 NOTE — Plan of Care (Signed)
  Problem: Metabolic: Goal: Ability to maintain appropriate glucose levels will improve Outcome: Progressing   Problem: Nutritional: Goal: Maintenance of adequate nutrition will improve Outcome: Progressing   Problem: Clinical Measurements: Goal: Respiratory complications will improve Outcome: Progressing Goal: Cardiovascular complication will be avoided Outcome: Progressing   Problem: Safety: Goal: Ability to remain free from injury will improve Outcome: Progressing   Problem: Cardiac: Goal: Ability to achieve and maintain adequate cardiopulmonary perfusion will improve Outcome: Progressing

## 2023-11-23 DIAGNOSIS — I509 Heart failure, unspecified: Secondary | ICD-10-CM | POA: Diagnosis not present

## 2023-11-23 DIAGNOSIS — I5023 Acute on chronic systolic (congestive) heart failure: Secondary | ICD-10-CM | POA: Diagnosis not present

## 2023-11-23 LAB — CBC
HCT: 34.6 % — ABNORMAL LOW (ref 36.0–46.0)
Hemoglobin: 10.9 g/dL — ABNORMAL LOW (ref 12.0–15.0)
MCH: 26.5 pg (ref 26.0–34.0)
MCHC: 31.5 g/dL (ref 30.0–36.0)
MCV: 84.2 fL (ref 80.0–100.0)
Platelets: 163 K/uL (ref 150–400)
RBC: 4.11 MIL/uL (ref 3.87–5.11)
RDW: 15.4 % (ref 11.5–15.5)
WBC: 8.3 K/uL (ref 4.0–10.5)
nRBC: 0 % (ref 0.0–0.2)

## 2023-11-23 LAB — BASIC METABOLIC PANEL WITH GFR
Anion gap: 12 (ref 5–15)
BUN: 30 mg/dL — ABNORMAL HIGH (ref 8–23)
CO2: 25 mmol/L (ref 22–32)
Calcium: 8.6 mg/dL — ABNORMAL LOW (ref 8.9–10.3)
Chloride: 96 mmol/L — ABNORMAL LOW (ref 98–111)
Creatinine, Ser: 1.94 mg/dL — ABNORMAL HIGH (ref 0.44–1.00)
GFR, Estimated: 27 mL/min — ABNORMAL LOW (ref >60)
Glucose, Bld: 106 mg/dL — ABNORMAL HIGH (ref 70–99)
Potassium: 3.9 mmol/L (ref 3.5–5.1)
Sodium: 133 mmol/L — ABNORMAL LOW (ref 135–145)

## 2023-11-23 LAB — GLUCOSE, CAPILLARY
Glucose-Capillary: 105 mg/dL — ABNORMAL HIGH (ref 70–99)
Glucose-Capillary: 102 mg/dL — ABNORMAL HIGH (ref 70–99)
Glucose-Capillary: 140 mg/dL — ABNORMAL HIGH (ref 70–99)
Glucose-Capillary: 132 mg/dL — ABNORMAL HIGH (ref 70–99)

## 2023-11-23 LAB — HEPARIN LEVEL (UNFRACTIONATED): Heparin Unfractionated: 0.59 [IU]/mL (ref 0.30–0.70)

## 2023-11-23 LAB — APTT: aPTT: 123 s — ABNORMAL HIGH (ref 24–36)

## 2023-11-23 LAB — HEMOGLOBIN A1C
Hgb A1c MFr Bld: 6.6 % — ABNORMAL HIGH (ref 4.8–5.6)
Mean Plasma Glucose: 142.72 mg/dL

## 2023-11-23 NOTE — Progress Notes (Signed)
 At bedside for PIV placement at the left side for heart Cath, assessed with ultrasound with no suitable veins found. Pt arm is so swollen and painful to move. RN at bedside and aware.

## 2023-11-23 NOTE — Progress Notes (Addendum)
 Rounding Note   Patient Name: Stephanie [name] Date of Encounter: 11/23/2023  [facility] Cardiologist: Jayson Sierras, MD   Subjective  Generalized myalgias. Has RUE PIC line for labs. Morbidly obese with limited mobility Cath to be done today Not done yesterday due to multiple acute MI's through ER Limited iv access UE;s tender and swollen   Scheduled Meds:  arformoterol  15 mcg Nebulization BID   And   umeclidinium bromide  1 puff Inhalation Daily   vitamin C   500 mg Oral Daily   atorvastatin   80 mg Oral Daily   bisoprolol   10 mg Oral Daily   Chlorhexidine  Gluconate Cloth  6 each Topical Daily   cycloSPORINE  1 drop Both Eyes BID   DULoxetine   60 mg Oral BID   insulin  aspart  0-15 Units Subcutaneous TID WC   insulin  aspart  0-5 Units Subcutaneous QHS   insulin  glargine  8 Units Subcutaneous QHS   levothyroxine   100 mcg Oral QAC breakfast   loratadine   10 mg Oral Daily   pantoprazole   40 mg Oral Daily   potassium chloride   40 mEq Oral Daily   pregabalin   75 mg Oral BID   sacubitril-valsartan  1 tablet Oral BID   sodium chloride  flush  10-40 mL Intracatheter Q12H   traZODone   150 mg Oral QHS   ursodiol  300 mg Oral BID   Continuous Infusions:  furosemide  120 mg (11/22/23 1716)   heparin  850 Units/hr (11/23/23 0245)   PRN Meds: acetaminophen  **OR** acetaminophen , alum & mag hydroxide-simeth, HYDROmorphone  (DILAUDID ) injection, hydrOXYzine , ipratropium-albuterol , lubiprostone , methocarbamol , ondansetron  **OR** ondansetron  (ZOFRAN ) IV, sodium chloride  flush, traMADol    Vital Signs  Vitals:   11/23/23 0436 11/23/23 0456 11/23/23 0514 11/23/23 0526  BP:  129/81    Pulse:  77 (!) 41   Resp:  14 (!) 7 17  Temp:  (!) 97.4 F (36.3 C)    TempSrc: Oral Oral    SpO2:   (!) 87%   Weight:   113.6 kg   Height:        Intake/Output Summary (Last 24 hours) at 11/23/2023 9361 Last data filed at 11/23/2023 0300 Gross per 24 hour  Intake --  Output 1650 ml  Net -1650  ml      11/23/2023    5:14 AM 11/22/2023    5:21 AM 11/21/2023    4:25 AM  Last 3 Weights  Weight (lbs) 250 lb 7.1 oz 258 lb 6.1 oz 259 lb 4.2 oz  Weight (kg) 113.6 kg 117.2 kg 117.6 kg      Telemetry Sinus rhythm with PACs - Personally reviewed  ECG  No new tracings - Personally Reviewed  Morbidly obese black female Tender swollen LUE PIC line in RUE Basilar atelectasis  Distant heart sounds Anasarca tense up to thighs plus 3 edema Good right radial pulse for cath   Labs High Sensitivity Troponin:  No results for input(s): TROPONINIHS in the last 720 hours.   Chemistry Recent Labs  Lab 11/16/23 1201 11/16/23 1325 11/19/23 0900 11/19/23 1518 11/20/23 0705 11/21/23 0709 11/21/23 1442 11/22/23 0652  NA 130*   < >  --    < > 134* 134*  --  131*  K 4.5   < >  --    < > 3.5 3.6  --  3.6  CL 95*   < >  --    < > 100 98  --  96*  CO2 21*   < >  --    < >  27 28  --  26  GLUCOSE 159*   < >  --    < > 191* 72  --  89  BUN 25*   < >  --    < > 27* 28*  --  29*  CREATININE 1.34*   < >  --    < > 1.63* 1.62*  --  1.85*  CALCIUM  9.6   < >  --    < > 8.5* 8.8*  --  8.3*  MG  --    < > 1.6*  --   --   --  1.7 2.0  PROT 9.1*  --   --   --   --   --   --  6.9  ALBUMIN 3.3*  --   --   --   --   --   --  2.0*  AST 39  --   --   --   --   --   --  28  ALT 30  --   --   --   --   --   --  19  ALKPHOS 410*  --   --   --   --   --   --  174*  BILITOT 3.1*  --   --   --   --   --   --  2.0*  GFRNONAA 42*   < >  --    < > 33* 33*  --  28*  ANIONGAP 14   < >  --    < > 7 8  --  9   < > = values in this interval not displayed.    Lipids  Recent Labs  Lab 11/18/23 1745  CHOL 113  TRIG 42  HDL 41  LDLCALC 64  CHOLHDL 2.8    Hematology Recent Labs  Lab 11/20/23 0705 11/21/23 0709 11/22/23 0652  WBC 8.2 8.4 7.5  RBC 3.83* 4.20 3.80*  HGB 10.3* 11.4* 10.3*  HCT 32.5* 35.4* 32.3*  MCV 84.9 84.3 85.0  MCH 26.9 27.1 27.1  MCHC 31.7 32.2 31.9  RDW 15.4 15.4 15.4  PLT 172  190 166   Thyroid   Recent Labs  Lab 11/16/23 1335 11/17/23 0857  TSH 6.730*  --   FREET4  --  2.40*    BNP Recent Labs  Lab 11/16/23 1201 11/18/23 1745  BNP  --  2,302.1*  PROBNP 14,798.0*  --     DDimer No results for input(s): DDIMER in the last 168 hours.   Radiology  No results found.   Cardiac Studies  Echo 11/17/23:  1. Left ventricular ejection fraction, by estimation, is 35 to 40%. Left  ventricular ejection fraction by 3D volume is 33 %. The left ventricle has  moderately decreased function. The left ventricle demonstrates regional  wall motion abnormalities (see  scoring diagram/findings for description). The left ventricular internal  cavity size was mildly dilated. Left ventricular diastolic parameters are  indeterminate.   2. Right ventricular systolic function is mildly reduced. The right  ventricular size is severely enlarged. Moderately increased right  ventricular wall thickness. There is normal pulmonary artery systolic  pressure. The estimated right ventricular  systolic pressure is 31.8 mmHg.   3. Large pleural effusion.   4. The mitral valve is normal in structure. Mild mitral valve  regurgitation. No evidence of mitral stenosis.   5. The aortic valve is tricuspid. Aortic valve regurgitation is not  visualized. No  aortic stenosis is present.   6. The inferior vena cava is dilated in size with >50% respiratory  variability, suggesting right atrial pressure of 8 mmHg.   Patient Profile    73 y.o. 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 Cardiology is following for evaluation of decompensated CHF.   Assessment & Plan   Acute on chronic systolic heart failure Anasarca  - continue lasix  drip Cr < 2 for cath at 1.85 no LV limited contrast  - prior LVEF 40% in 05/2023 - echo this  admission with LVEF 35-40% with RWMA, mildly reduced RV function, severe RV enlargement with normal PASP, pleural effusion.  - lasix  120 iv bid bolus dosing  - Creatinine 1.85 - GDMT: no SGLT2i due to fatigue and frequent yeast infections, continue 10 mg bisoprolol , 24-26 mg entresto  - continue 120 mg IV lasix  BID and add K supplement  - Able to lay flat. Still with gross anasarca for right/left cath today per Dr Kriste    DVT - holding home eliquis  pending cath -  continue heparin    Hypertension - managed in the context of HFrEF  Myalgias per primary team  Hyperlipidemia with LDL goal < 70 11/18/2023: Cholesterol 113; HDL 41; LDL Cholesterol 64; Triglycerides 42; VLDL 8 Continue statin - 80 mg lipitor     For questions or updates, please contact [facility] Please consult [email or web address] for contact info under       Signed, Maude Emmer, MD  11/23/2023, 6:38 AM

## 2023-11-23 NOTE — Progress Notes (Signed)
 Progress Note   Patient: Stephanie [name] FMW:[id number] DOB: Jul 16, 1950 DOA: 11/16/2023     7 DOS: the patient was seen and examined on 11/23/2023   Brief hospital course: 73 y.o. female with medical history significant of systolic heart failure, hypothyroidism, hyperlipidemia, COPD/asthma, history of DVT, fibromyalgia, GERD and type 2 diabetes mellitus with nephropathy; who presented to the hospital secondary to increased lower extremity swelling, shortness of breath with activity/orthopnea and weight gain.  Patient reports symptom has been present for the last 3 to 4 days and worsening; she was scheduled to be seen by cardiology service as an outpatient but unfortunately due to worsening of her symptoms did not make it and presented to the emergency department instead.     Of note, patient reports to be compliant with her medications.   ED:  Demonstrating elevated BNP, signs of fluid overload on physical exam and a chest x-ray with vascular congestion/interstitial edema.  IV Lasix  was started  Assessment and Plan: Acute on chronic systolic heart failure - Hemodynamically stable, satting 100% on 1 L of oxygen  -Repeat 2d echo with EF 35-40% with regional WMA, mildly reduced RV function and severely enlarged RV size  - h/o EJF around 40% on previous echo. - Presenting with complaint of weight gain, orthopnea, lower extremity swelling and elevated BNP -presenting BNP 14,798.0 - Cardiology following, cont 120mg  IV lasix  bid - Continue Entresto , Bisoprolol ,   -continued on heparin  gtt. Planned LHC/RHC today, however with worsened renal function, cath is held off for today   COPD/asthma - Appears to be stable and currently no signs of acute exacerbation - Continue bronchodilator management.   Type 2 diabetes with nephropathy - Update A1c - Checking CBG q. ACHS, SSI coverage   Acute on Chronic kidney disease stage IIIb  - With a slightly increase in patient's creatinine from recent values most  likely in the setting of decreased perfusion with CHF exacerbation -Cr higher today to 1.9. On IV lasix  per Cardiology   Hypertension - Stable on Lasix  drip, Entresto  - Continue current antihypertensive agent -Holding home medication spironolactone   GERD - Continue PPI   Hypothyroidism - TSH: 6.73 -Checking free T3, T4 - Continue Synthroid .   Depression/anxiety/neuropathy and Fibromyalgia - Continue Cymbalta  and Atarax  - Continue Lyrica  and Robaxin  - Complained of increased pain this AM. Added PRN IV dilaudid  for breakthrough    History of DVT and prior CVA - No acute neurologic deficit - Continue heparin  gtt for now    Hyperlipidemia - Resume outpatient Repatha  after discharge  Constipation -Pt reports good results recently with cathartics   Subjective: Complains of generalized pains this AM. Not complaining of sob  Physical Exam: Vitals:   11/23/23 0526 11/23/23 0748 11/23/23 0753 11/23/23 1201  BP:  124/70  104/60  Pulse:  81 82   Resp: 17 16 18 16   Temp:  (!) 97.4 F (36.3 C)  (!) 97.5 F (36.4 C)  TempSrc:  Oral  Oral  SpO2:  100%  96%  Weight:      Height:       General exam: Conversant, in no acute distress Respiratory system: normal chest rise, clear, no audible wheezing Cardiovascular system: regular rhythm, s1-s2 Gastrointestinal system: Nondistended, nontender, pos BS Central nervous system: No seizures, no tremors Extremities: No cyanosis, no joint deformities Skin: No rashes, no pallor Psychiatry: Affect normal // no auditory hallucinations   Data Reviewed:  Labs reviewed: Na 133, K 3.9, Cr 1.94, WBC 8.3, Hgb 10.9, Plts 163  Family Communication: Pt in room, family not at bedside  Disposition: Status is: Inpatient Remains inpatient appropriate because: severity of illness  Planned Discharge Destination: Home and Skilled nursing facility    Author: Garnette Pelt, MD 11/23/2023 2:28 PM  For on call review [email or web address].

## 2023-11-23 NOTE — Progress Notes (Signed)
 Because of concerns with the patient's renal function the patient did not receive a cardiac catheterization today.  Will continue diuresis with IV Lasix  and get a cath at a later date when the renal function improves.  Discussed this with the patient and answered all of her questions.  Signed,  Morse Clause, PA-C 11/23/2023, 2:26 PM

## 2023-11-23 NOTE — TOC Progression Note (Signed)
 Transition of Care [facility]) - Progression Note    Patient Details  Name: Stephanie [name] MRN: [id number] Date of Birth: 1950/12/08  [facility] CM/SW Contact  Isaiah Public, LCSWA Phone Number: 11/23/2023, 11:35 AM  Clinical Narrative:     Patient has SNF bed at [facility]. CSW following to start insurance authorization closer to patient being medically ready for dc. CSW will continue to follow.   Expected Discharge Plan:  (TBD) Barriers to Discharge: Continued Medical Work up               Expected Discharge Plan and Services In-house Referral: Clinical Social Work     Living arrangements for the past 2 months: Single Family Home                                       Social Drivers of Health (SDOH) Interventions SDOH Screenings   Food Insecurity: No Food Insecurity (11/17/2023)  Housing: Low Risk  (11/17/2023)  Transportation Needs: No Transportation Needs (11/17/2023)  Utilities: Not At Risk (11/17/2023)  Depression (PHQ2-9): Low Risk  (09/14/2023)  Social Connections: Moderately Isolated (11/17/2023)  Tobacco Use: Medium Risk (11/16/2023)    Readmission Risk Interventions     No data to display

## 2023-11-23 NOTE — Progress Notes (Addendum)
 PHARMACY - ANTICOAGULATION  Pharmacy Consult for heparin  (Eliquis  PTA) Indication: hx DVT   Allergies  Allergen Reactions   Tetracyclines & Related Anaphylaxis and Rash   Banana Hives and Nausea And Vomiting   Jardiance  [Empagliflozin ]     Yeast infections, fatigue   Penicillins Rash and Other (See Comments)   Patient Measurements: Height: 5' 8 (172.7 cm) Weight: 113.6 kg (250 lb 7.1 oz) IBW/kg (Calculated) : 63.9 HEPARIN  DW (KG): 95.1  Vital Signs: Temp: 97.5 F (36.4 C) (10/07 1201) Temp Source: Oral (10/07 1201) BP: 104/60 (10/07 1201) Pulse Rate: 82 (10/07 0753)  Labs: Recent Labs    11/21/23 0709 11/21/23 0712 11/21/23 2200 11/22/23 0652 11/22/23 1900 11/23/23 0500 11/23/23 0700 11/23/23 1040  HGB 11.4*  --   --  10.3*  --  10.9*  --   --   HCT 35.4*  --   --  32.3*  --  34.6*  --   --   PLT 190  --   --  166  --  163  --   --   APTT 105*  --  178*  --  117*  --   --  123*  HEPARINUNFRC  --  0.76*  --   --  0.61  --  0.59  --   CREATININE 1.62*  --   --  1.85*  --  1.94*  --   --    Estimated Creatinine Clearance: 34.2 mL/min (A) (by C-G formula based on SCr of 1.94 mg/dL (H)).  Assessment: 73 y.o. female with history of bilateral DVTs in 2017 taking Eliquis  PTA (last dose 10/1 AM). Pharmacy consulted to dose heparin  pending cath.  -aPTT=123 on 850 units/hr -plans for cath today  Goal of Therapy:  Heparin  level 0.3-0.7 units/ml aPTT 66-102 seconds Monitor platelets by anticoagulation protocol: Yes   Plan:  Decrease heparin  drip to 700 units/h.  F/u after cath  Prentice Poisson, PharmD Clinical Pharmacist **Pharmacist phone directory can now be found on [email or web address] ([email or web address]).  Listed under [facility].

## 2023-11-24 ENCOUNTER — Encounter (HOSPITAL_COMMUNITY)
Admission: EM | Disposition: A | Discharge status: Skilled Nursing Facility | Payer: Self-pay | Source: Home / Self Care | Attending: Family Medicine

## 2023-11-24 ENCOUNTER — Inpatient Hospital Stay (HOSPITAL_COMMUNITY)

## 2023-11-24 DIAGNOSIS — I5023 Acute on chronic systolic (congestive) heart failure: Secondary | ICD-10-CM | POA: Diagnosis not present

## 2023-11-24 LAB — HEPARIN LEVEL (UNFRACTIONATED)
Heparin Unfractionated: 0.42 [IU]/mL (ref 0.30–0.70)
Heparin Unfractionated: 0.39 [IU]/mL (ref 0.30–0.70)

## 2023-11-24 LAB — CBC
HCT: 32.6 % — ABNORMAL LOW (ref 36.0–46.0)
Hemoglobin: 10.3 g/dL — ABNORMAL LOW (ref 12.0–15.0)
MCH: 26.6 pg (ref 26.0–34.0)
MCHC: 31.6 g/dL (ref 30.0–36.0)
MCV: 84.2 fL (ref 80.0–100.0)
Platelets: 151 K/uL (ref 150–400)
RBC: 3.87 MIL/uL (ref 3.87–5.11)
RDW: 15.4 % (ref 11.5–15.5)
WBC: 9.8 K/uL (ref 4.0–10.5)
nRBC: 0 % (ref 0.0–0.2)

## 2023-11-24 LAB — MAGNESIUM: Magnesium: 2.1 mg/dL (ref 1.7–2.4)

## 2023-11-24 LAB — COMPREHENSIVE METABOLIC PANEL WITH GFR
ALT: 20 U/L (ref 0–44)
AST: 32 U/L (ref 15–41)
Albumin: 2.2 g/dL — ABNORMAL LOW (ref 3.5–5.0)
Alkaline Phosphatase: 184 U/L — ABNORMAL HIGH (ref 38–126)
Anion gap: 11 (ref 5–15)
BUN: 30 mg/dL — ABNORMAL HIGH (ref 8–23)
CO2: 25 mmol/L (ref 22–32)
Calcium: 8.5 mg/dL — ABNORMAL LOW (ref 8.9–10.3)
Chloride: 96 mmol/L — ABNORMAL LOW (ref 98–111)
Creatinine, Ser: 1.93 mg/dL — ABNORMAL HIGH (ref 0.44–1.00)
GFR, Estimated: 27 mL/min — ABNORMAL LOW (ref >60)
Glucose, Bld: 111 mg/dL — ABNORMAL HIGH (ref 70–99)
Potassium: 3.8 mmol/L (ref 3.5–5.1)
Sodium: 132 mmol/L — ABNORMAL LOW (ref 135–145)
Total Bilirubin: 2.3 mg/dL — ABNORMAL HIGH (ref 0.0–1.2)
Total Protein: 7.2 g/dL (ref 6.5–8.1)

## 2023-11-24 LAB — GLUCOSE, CAPILLARY
Glucose-Capillary: 84 mg/dL (ref 70–99)
Glucose-Capillary: 78 mg/dL (ref 70–99)
Glucose-Capillary: 73 mg/dL (ref 70–99)
Glucose-Capillary: 89 mg/dL (ref 70–99)
Glucose-Capillary: 85 mg/dL (ref 70–99)

## 2023-11-24 SURGERY — RIGHT/LEFT HEART CATH AND CORONARY ANGIOGRAPHY
Anesthesia: LOCAL

## 2023-11-24 MED ORDER — ASPIRIN 81 MG PO CHEW: 81.0000 mg | CHEWABLE_TABLET | ORAL | Status: AC

## 2023-11-24 NOTE — Progress Notes (Signed)
 PHARMACY - ANTICOAGULATION  Pharmacy Consult for heparin  (Eliquis  PTA) Indication: hx DVT   Allergies  Allergen Reactions   Tetracyclines & Related Anaphylaxis and Rash   Banana Hives and Nausea And Vomiting   Jardiance  [Empagliflozin ]     Yeast infections, fatigue   Penicillins Rash and Other (See Comments)   Patient Measurements: Height: 5' 8 (172.7 cm) Weight: 114.9 kg (253 lb 4.9 oz) IBW/kg (Calculated) : 63.9 HEPARIN  DW (KG): 95.1  Vital Signs: Temp: 97.7 F (36.5 C) (10/08 1155) Temp Source: Oral (10/08 1155) BP: 104/67 (10/08 1155) Pulse Rate: 79 (10/08 1155)  Labs: Recent Labs    11/21/23 2200 11/22/23 0652 11/22/23 0652 11/22/23 1900 11/23/23 0500 11/23/23 0700 11/23/23 1040 11/24/23 0150 11/24/23 1345  HGB  --  10.3*   < >  --  10.9*  --   --  10.3*  --   HCT  --  32.3*  --   --  34.6*  --   --  32.6*  --   PLT  --  166  --   --  163  --   --  151  --   APTT 178*  --   --  117*  --   --  123*  --   --   HEPARINUNFRC  --   --    < > 0.61  --  0.59  --  0.42 0.39  CREATININE  --  1.85*  --   --  1.94*  --   --  1.93*  --    < > = values in this interval not displayed.   Estimated Creatinine Clearance: 34.5 mL/min (A) (by C-G formula based on SCr of 1.93 mg/dL (H)).  Assessment: 73 y.o. female with history of bilateral DVTs in 2017 taking Eliquis  PTA (last dose 10/1 AM). Pharmacy consulted to dose heparin  pending cath.  Heparin  level at goal on 700 units/hr  Goal of Therapy:  Heparin  level 0.3-0.7 units/ml aPTT 66-102 seconds Monitor platelets by anticoagulation protocol: Yes   Plan:  Continue heparin  drip to 700 units/h Daily heparin  level and CBC  Prentice Poisson, PharmD Clinical Pharmacist **Pharmacist phone directory can now be found on [email or web address] ([email or web address]).  Listed under [facility].

## 2023-11-24 NOTE — Progress Notes (Signed)
 PHARMACY - ANTICOAGULATION  Pharmacy Consult for heparin  (Eliquis  PTA) Indication: hx DVT   Allergies  Allergen Reactions   Tetracyclines & Related Anaphylaxis and Rash   Banana Hives and Nausea And Vomiting   Jardiance  [Empagliflozin ]     Yeast infections, fatigue   Penicillins Rash and Other (See Comments)   Patient Measurements: Height: 5' 8 (172.7 cm) Weight: 114.9 kg (253 lb 4.9 oz) IBW/kg (Calculated) : 63.9 HEPARIN  DW (KG): 95.1  Vital Signs: Temp: 97.6 F (36.4 C) (10/08 0359) Temp Source: Axillary (10/08 0359) BP: 106/58 (10/08 0359) Pulse Rate: 77 (10/08 0359)  Labs: Recent Labs    11/21/23 2200 11/22/23 0652 11/22/23 1900 11/23/23 0500 11/23/23 0700 11/23/23 1040 11/24/23 0150  HGB  --  10.3*  --  10.9*  --   --  10.3*  HCT  --  32.3*  --  34.6*  --   --  32.6*  PLT  --  166  --  163  --   --  151  APTT 178*  --  117*  --   --  123*  --   HEPARINUNFRC  --   --  0.61  --  0.59  --  0.42  CREATININE  --  1.85*  --  1.94*  --   --  1.93*   Estimated Creatinine Clearance: 34.5 mL/min (A) (by C-G formula based on SCr of 1.93 mg/dL (H)).  Assessment: 73 y.o. female with history of bilateral DVTs in 2017 taking Eliquis  PTA (last dose 10/1 AM). Pharmacy consulted to dose heparin  pending cath.  10/8 AM update:  Heparin  level therapeutic  Goal of Therapy:  Heparin  level 0.3-0.7 units/mL Monitor platelets by anticoagulation protocol: Yes   Plan:  Cont heparin  700 units/hr Heparin  level in 8 hours  Lynwood Mckusick, PharmD, BCPS Clinical Pharmacist Phone: [phone]

## 2023-11-24 NOTE — Progress Notes (Signed)
 Occupational Therapy Treatment Patient Details Name: Stephanie [name] MRN: [id number] DOB: [birth year] Today's Date: 11/24/2023   History of present illness 73 y.o. female who presented 9/30 with lower extremity swelling, SOB, and weight gain. Dx acute on chronic systolic heart failure. PMH includes: systolic heart failure, hypothyroidism, hyperlipidemia, COPD/asthma, history of DVT, fibromyalgia, GERD and type 2 diabetes mellitus with nephropathy.   OT comments  Pt progressing well towards goals. Progressed to complete functional transfer to [facility] with mod +2 assist. Still limited by decreased standing balance, requiring total assist for toilet hygiene. Continue to recommend <3 hours of skilled rehab daily to optimize independence levels. Will continue to follow acutely.       If plan is discharge home, recommend the following:  A lot of help with bathing/dressing/bathroom;Two people to help with walking and/or transfers;Assist for transportation;Help with stairs or ramp for entrance   Equipment Recommendations  None recommended by OT    Recommendations for Other Services      Precautions / Restrictions Precautions Precautions: Fall Recall of Precautions/Restrictions: Intact Precaution/Restrictions Comments: significant edema Restrictions Weight Bearing Restrictions Per Provider Order: No       Mobility Bed Mobility Overal bed mobility: Needs Assistance Bed Mobility: Supine to Sit, Sit to Supine     Supine to sit: Mod assist, +2 for physical assistance, HOB elevated Sit to supine: Mod assist, +2 for safety/equipment   General bed mobility comments: modA for trunk support, cues for LE    Transfers Overall transfer level: Needs assistance Equipment used: Rolling walker (2 wheels) Transfers: Sit to/from Stand, Bed to chair/wheelchair/BSC Sit to Stand: Mod assist, +2 physical assistance, From elevated surface     Step pivot transfers: Min assist, +2 physical assistance      General transfer comment: modA +2 to come to stand, cues for hand placement. Once in standing largely CGA with RW     Balance Overall balance assessment: Needs assistance Sitting-balance support: Feet supported, Bilateral upper extremity supported Sitting balance-Leahy Scale: Poor     Standing balance support: Bilateral upper extremity supported Standing balance-Leahy Scale: Poor Standing balance comment: Reliant on RW       ADL either performed or assessed with clinical judgement   ADL Overall ADL's : Needs assistance/impaired     Toilet Transfer: Moderate assistance;+2 for physical assistance;+2 for safety/equipment Toilet Transfer Details (indicate cue type and reason): Mod +2 to come to stand, once in standing pt largely CGA for step pivot to bed Toileting- Clothing Manipulation and Hygiene: Total assistance;+2 for physical assistance;Cueing for safety;Cueing for sequencing       Functional mobility during ADLs: Moderate assistance;+2 for physical assistance;+2 for safety/equipment;Rolling walker (2 wheels) General ADL Comments: Improved transfer ability, decreased standing balance    Extremity/Trunk Assessment Upper Extremity Assessment Upper Extremity Assessment: Generalized weakness   Lower Extremity Assessment Lower Extremity Assessment: Defer to PT evaluation                 Communication Communication Communication: No apparent difficulties   Cognition Arousal: Alert Behavior During Therapy: WFL for tasks assessed/performed Cognition: No apparent impairments       Following commands: Intact        Cueing   Cueing Techniques: Verbal cues, Gestural cues        General Comments VSS on 2L    Pertinent Vitals/ Pain       Pain Assessment Pain Assessment: Faces Faces Pain Scale: Hurts little more Pain Location: generalized discomfort with ROM Pain Descriptors / Indicators:  Aching, Discomfort, Grimacing, Spasm Pain Intervention(s): Monitored  during session   Frequency  Min 2X/week        Progress Toward Goals  OT Goals(current goals can now be found in the care plan section)  Progress towards OT goals: Progressing toward goals  Acute Rehab OT Goals Patient Stated Goal: To get better OT Goal Formulation: With patient Time For Goal Achievement: 12/03/23 Potential to Achieve Goals: Good ADL Goals Pt Will Perform Grooming: with supervision;with set-up Pt Will Perform Upper Body Bathing: with contact guard assist;with supervision;sitting Pt Will Perform Lower Body Bathing: with mod assist;sitting/lateral leans Pt Will Perform Upper Body Dressing: with contact guard assist;with supervision;sitting Pt Will Transfer to Toilet: with max assist;with mod assist;stand pivot transfer  Plan         AM-PAC OT 6 Clicks Daily Activity     Outcome Measure   Help from another person eating meals?: None Help from another person taking care of personal grooming?: A Little Help from another person toileting, which includes using toliet, bedpan, or urinal?: Total Help from another person bathing (including washing, rinsing, drying)?: A Lot Help from another person to put on and taking off regular upper body clothing?: A Little Help from another person to put on and taking off regular lower body clothing?: A Lot 6 Click Score: 15    End of Session Equipment Utilized During Treatment: Gait belt;Rolling walker (2 wheels)  OT Visit Diagnosis: Other abnormalities of gait and mobility (R26.89);Unsteadiness on feet (R26.81);History of falling (Z91.81);Muscle weakness (generalized) (M62.81)   Activity Tolerance Patient tolerated treatment well   Patient Left in bed;with call bell/phone within reach;with bed alarm set   Nurse Communication Mobility status        Time: 8696-8655 OT Time Calculation (min): 41 min  Charges: OT General Charges $OT Visit: 1 Visit OT Treatments $Self Care/Home Management : 8-22 mins  Adrianne BROCKS,  OT  Acute Rehabilitation Services Office [phone] Secure chat preferred   Adrianne GORMAN Savers 11/24/2023, 3:09 PM

## 2023-11-24 NOTE — Progress Notes (Signed)
 [facility]  PROGRESS NOTE  Stephanie [name] FMW:[id number] DOB: March 08, 1950 DOA: 11/16/2023 PCP: Roni, [facility]   Brief HPI:   73 y.o. female with medical history significant of systolic heart failure, hypothyroidism, hyperlipidemia, COPD/asthma, history of DVT, fibromyalgia, GERD and type 2 diabetes mellitus with nephropathy; who presented to the hospital secondary to increased lower extremity swelling, shortness of breath with activity/orthopnea and weight gain.  Patient reports symptom has been present for the last 3 to 4 days and worsening; she was scheduled to be seen by cardiology service as an outpatient but unfortunately due to worsening of her symptoms did not make it and presented to the emergency department instead.     Of note, patient reports to be compliant with her medications.   ED:  Demonstrating elevated BNP, signs of fluid overload on physical exam and a chest x-ray with vascular congestion/interstitial edema.  IV Lasix  was started      Assessment/Plan:   Acute on chronic systolic heart failure - Hemodynamically stable, satting 100% on 1 L of oxygen  -Repeat 2d echo with EF 35-40% with regional WMA, mildly reduced RV function and severely enlarged RV size  - h/o EJF around 40% on previous echo. - Presenting with complaint of weight gain, orthopnea, lower extremity swelling and elevated BNP -presenting BNP 14,798.0 - Cardiology following, cont 120mg  IV lasix  bid - Continue Entresto , Bisoprolol ,   -continued on heparin  gtt. plan was to get LHC/RHC in the hospital however, currently on hold due to worsening CHF and renal insufficiency -Plan for outpatient cardiac cath as per Dr. Ladona   COPD/asthma - Appears to be stable and currently no signs of acute exacerbation - Continue bronchodilator management.   Type 2 diabetes with nephropathy - Update A1c - Checking CBG q. ACHS, SSI coverage   Acute on Chronic kidney disease stage IIIb  - With a slightly  increase in patient's creatinine from recent values most likely in the setting of decreased perfusion with CHF exacerbation -Cr higher today to 1.9. On IV lasix  per Cardiology - Follow renal function in a.m.   Hypertension - Stable on Lasix  drip, Entresto  - Continue current antihypertensive agent -Holding home medication spironolactone   GERD - Continue PPI   Hypothyroidism - TSH: 6.73 -Checking free T3, T4 - Continue Synthroid .   Depression/anxiety/neuropathy and Fibromyalgia - Continue Cymbalta  and Atarax  - Continue Lyrica  and Robaxin  - Complained of increased pain this AM. Added PRN IV dilaudid  for breakthrough    History of DVT and prior CVA - No acute neurologic deficit - Continue heparin  gtt for now    Hyperlipidemia - Resume outpatient Repatha  after discharge   Constipation -Pt reports good results recently with cathartics    Medications     arformoterol  15 mcg Nebulization BID   And   umeclidinium bromide  1 puff Inhalation Daily   vitamin C   500 mg Oral Daily   aspirin   81 mg Oral Pre-Cath   atorvastatin   80 mg Oral Daily   bisoprolol   10 mg Oral Daily   Chlorhexidine  Gluconate Cloth  6 each Topical Daily   cycloSPORINE  1 drop Both Eyes BID   DULoxetine   60 mg Oral BID   insulin  aspart  0-15 Units Subcutaneous TID WC   insulin  aspart  0-5 Units Subcutaneous QHS   insulin  glargine  8 Units Subcutaneous QHS   levothyroxine   100 mcg Oral QAC breakfast   loratadine   10 mg Oral Daily   pantoprazole   40 mg Oral  Daily   potassium chloride   40 mEq Oral Daily   pregabalin   75 mg Oral BID   sacubitril-valsartan  1 tablet Oral BID   sodium chloride  flush  10-40 mL Intracatheter Q12H   traZODone   150 mg Oral QHS   ursodiol  300 mg Oral BID     Data Reviewed:   CBG:  Recent Labs  Lab 11/22/23 2127 11/23/23 0748 11/23/23 1205 11/23/23 1628 11/23/23 2045  GLUCAP 120* 105* 102* 140* 132*    SpO2: 100 % O2 Flow Rate (L/min): 2 L/min FiO2 (%): 28  %    Vitals:   11/23/23 2356 11/24/23 0000 11/24/23 0359 11/24/23 0752  BP: 107/88 107/88 (!) 106/58   Pulse: 87  77   Resp: 15 11 18    Temp: 97.9 F (36.6 C)  97.6 F (36.4 C)   TempSrc: Oral  Axillary   SpO2: 94%  95% 100%  Weight:   114.9 kg   Height:          Data Reviewed:  Basic Metabolic Panel: Recent Labs  Lab 11/18/23 1745 11/19/23 0712 11/19/23 0900 11/19/23 1518 11/20/23 0705 11/21/23 0709 11/21/23 1442 11/22/23 0652 11/23/23 0500 11/24/23 0150  NA 132*  131*   < >  --    < > 134* 134*  --  131* 133* 132*  K 3.9  3.9   < >  --    < > 3.5 3.6  --  3.6 3.9 3.8  CL 99  98   < >  --    < > 100 98  --  96* 96* 96*  CO2 26  26   < >  --    < > 27 28  --  26 25 25   GLUCOSE 145*  143*   < >  --    < > 191* 72  --  89 106* 111*  BUN 27*  28*   < >  --    < > 27* 28*  --  29* 30* 30*  CREATININE 1.58*  1.54*   < >  --    < > 1.63* 1.62*  --  1.85* 1.94* 1.93*  CALCIUM  8.7*  8.5*   < >  --    < > 8.5* 8.8*  --  8.3* 8.6* 8.5*  MG 1.6*  1.7  --  1.6*  --   --   --  1.7 2.0  --  2.1   < > = values in this interval not displayed.    CBC: Recent Labs  Lab 11/20/23 0705 11/21/23 0709 11/22/23 0652 11/23/23 0500 11/24/23 0150  WBC 8.2 8.4 7.5 8.3 9.8  HGB 10.3* 11.4* 10.3* 10.9* 10.3*  HCT 32.5* 35.4* 32.3* 34.6* 32.6*  MCV 84.9 84.3 85.0 84.2 84.2  PLT 172 190 166 163 151    LFT Recent Labs  Lab 11/22/23 0652 11/24/23 0150  AST 28 32  ALT 19 20  ALKPHOS 174* 184*  BILITOT 2.0* 2.3*  PROT 6.9 7.2  ALBUMIN 2.0* 2.2*     Antibiotics: Anti-infectives (From admission, onward)    None        DVT prophylaxis: Heparin   Code Status: Full code  Family Communication: No family at bedside   CONSULTS cardiology   Subjective   Patient seen and examined.  Denies chest pain or shortness of breath.   Objective    Physical Examination:  General-appears in no acute distress Heart-S1-S2, regular, no murmur  auscultated Lungs-clear to auscultation  bilaterally, no wheezing or crackles auscultated Abdomen-soft, nontender, no organomegaly Extremities-bilateral 1+  edema in the lower extremities Neuro-alert, oriented x3, no focal deficit noted   Status is: Inpatient:             Stephanie [name]   [facility] If 7PM-7AM, please contact night-coverage at [email or web address], Office  [phone]   11/24/2023, 8:13 AM  LOS: 8 days

## 2023-11-24 NOTE — TOC Progression Note (Signed)
 Transition of Care [facility]) - Progression Note    Patient Details  Name: Stephanie [name] MRN: [id number] Date of Birth: [birth year]  [facility] CM/SW Contact  Isaiah Public, LCSWA Phone Number: 11/24/2023, 12:10 PM  Clinical Narrative:     Patient has SNF bed at [facility]. CSW following to start insurance authorization closer to patient being medically ready for dc. CSW will continue to follow.   Expected Discharge Plan:  (TBD) Barriers to Discharge: Continued Medical Work up               Expected Discharge Plan and Services In-house Referral: Clinical Social Work     Living arrangements for the past 2 months: Single Family Home                                       Social Drivers of Health (SDOH) Interventions SDOH Screenings   Food Insecurity: No Food Insecurity (11/17/2023)  Housing: Low Risk  (11/17/2023)  Transportation Needs: No Transportation Needs (11/17/2023)  Utilities: Not At Risk (11/17/2023)  Depression (PHQ2-9): Low Risk  (09/14/2023)  Social Connections: Moderately Isolated (11/17/2023)  Tobacco Use: Medium Risk (11/16/2023)    Readmission Risk Interventions     No data to display

## 2023-11-24 NOTE — Progress Notes (Addendum)
 Progress Note  Patient Name: Stephanie [name] Date of Encounter: 11/24/2023 [facility] Cardiologist: Jayson Sierras, MD   Interval Summary    Patient laying in bed somewhat flat during interview.  Reported that she is able to lay flat but does get short of breath after doing that for some period of time.  Stated that she feels like the swelling is better than it was yesterday.  Denies any vomiting, fever, chills, melena, hematuria, and hematochezia.  Vital Signs Vitals:   11/23/23 2200 11/23/23 2356 11/24/23 0000 11/24/23 0359  BP:  107/88 107/88 (!) 106/58  Pulse:  87  77  Resp: (!) 23 15 11 18   Temp:  97.9 F (36.6 C)  97.6 F (36.4 C)  TempSrc:  Oral  Axillary  SpO2:  94%  95%  Weight:    114.9 kg  Height:        Intake/Output Summary (Last 24 hours) at 11/24/2023 0748 Last data filed at 11/24/2023 0003 Gross per 24 hour  Intake 720 ml  Output 700 ml  Net 20 ml      11/24/2023    3:59 AM 11/23/2023    5:14 AM 11/22/2023    5:21 AM  Last 3 Weights  Weight (lbs) 253 lb 4.9 oz 250 lb 7.1 oz 258 lb 6.1 oz  Weight (kg) 114.9 kg 113.6 kg 117.2 kg      Telemetry/ECG  Normal sinus rhythm with heart rates in the 70s to 80s- Personally Reviewed  Physical Exam  GEN: No acute distress.  Alert and orientated.  On 1 L of oxygen  this is the patient's baseline requirement. Neck: Unable to assess JVD due to body habitus Cardiac: RRR, no murmurs, rubs, or gallops.  Chest tender to palpation Respiratory: Minimal bibasilar crackles GI: Soft, nontender, abdomen distended MS: 2+ bilateral lower extremity edema.  Also has swelling in arms.  Assessment & Plan    Acute on chronic systolic heart failure AKI Patient was hospitalized with shortness of breath and lower extremity edema.  Prior to admission the patient was on 40 mg twice daily of torsemide . TTE on 11/17/2023 showed a reduced LVEF of 35 to 40%, regional wall motion abnormalities, mildly reduced RV function, IVC with  greater than 50% respiratory variability, large pleural effusion, and normal pulmonary artery systolic pressure.  Prior echo on 4/25 showed an LVEF of 40%. The patient was felt to be volume overloaded.  The patient was started on 120 mg IV Lasix  in dextrose  twice daily and has remained on this for the past 5 days. I's and O's since admission are net out 7.295 L.  The patient's weight is down over 30 pounds. Creatinine over the past 5 days has increased from about 1.5 to 1.93 this a.m.  The patient's baseline creatinine appears to be between 1 and 1.2. Order chest x-ray Not a candidate for a left heart cath today with elevated creatinine. Planning on right heart cath on Friday to assess if patient needs Lasix .  It is somewhat difficult to assess volume status given patient body habitus and hypoalbuminemia contributing to swelling. GDMT Is a poor candidate for SGLT2 with frequent yeast infections. Stop Entresto  24-26 twice daily due to worsening creatinine. Continue bisoprolol  10 mg daily   DVT Continue IV heparin . Hold home Eliquis .  Plan to restart following cath.   Hypertension Management per heart failure above.   Hypoalbuminemia Anasarca Albumin this a.m. was 2.2.  Suspect this may be contributing to the patient's generalized swelling.  Patient reported poor oral intake.  The patient was to have a degree of nephrotic syndrome as the UA was also positive for protein. Management per primary.   Coronary artery calcifications Hyperlipidemia Coronary artery calcification seen on chest CT on 12/2022 On 11/18/23 LDL was 64.  Continue Lipitor  80 mg daily Continue Zetia 10 mg daily No need for aspirin  as is on Eliquis  for DVT at home.     For questions or updates, please contact  [facility] Please consult [email or web address] for contact info under        Signed, Morse Clause, PA-C   Patient examined chart reviewed. AM confusion has cleared. Still with anasarca pitting edema  stomach, thighs and LE;s Not clear why UE;s especially on left is so tending Has been on heparin  and has RUE PIC line Consider ordering bilateral UE venous duplex study. Continue with IV lasix  diuresis. Suspect we will do right heart cath only on Friday to assess CO/PCWP if Cr stays around 2  Maude Emmer MD [facility]

## 2023-11-24 NOTE — Progress Notes (Signed)
 I have reviewed her chart, reviewed her labs, patient still is significantly volume overloaded.  She has acute on chronic renal failure stage IIIb, secondary to ATN from cardiorenal syndrome.  Would recommend medical optimization, heart catheterization can be performed in the outpatient basis when she is optimally medically treated.  She has high risk for developing ATN and complications given her age and her comorbidity and still ongoing acute decompensated heart failure.     Gordy Bergamo, MD, [facility] 11/24/2023, 8:37 AM [facility] [address] [place], [place] [address] Phone: [phone]. Fax:  [phone]

## 2023-11-24 NOTE — Progress Notes (Signed)
 Patient blood sugar is 85. Segars MD notified. Holding Lantus  at this time.

## 2023-11-24 NOTE — Progress Notes (Signed)
 Physical Therapy Treatment Patient Details Name: Stephanie [name] MRN: [id number] DOB: [birth year] Today's Date: 11/24/2023   History of Present Illness 73 y.o. female who presented 9/30 with lower extremity swelling, SOB, and weight gain. Dx acute on chronic systolic heart failure. PMH includes: systolic heart failure, hypothyroidism, hyperlipidemia, COPD/asthma, history of DVT, fibromyalgia, GERD and type 2 diabetes mellitus with nephropathy.    PT Comments  The pt was able to make good progress with mobility this afternoon, completing ~5 ft ambulation and multiple sit-stand transfers from EOB and BSC. Pt continues to present with deficits in LE strength, power, endurance, and stability requiring modA of 2 to complete transfers and to maintain standing balance. Pt is motivated to continue mobility progression, recommendations remain appropriate, will continue efforts acutely.     If plan is discharge home, recommend the following: A lot of help with bathing/dressing/bathroom;Assistance with cooking/housework;Assist for transportation;Help with stairs or ramp for entrance;Two people to help with walking and/or transfers   Can travel by private vehicle     No  Equipment Recommendations  [place] lift;Hospital bed    Recommendations for Other Services       Precautions / Restrictions Precautions Precautions: Fall Recall of Precautions/Restrictions: Intact Precaution/Restrictions Comments: significant edema Restrictions Weight Bearing Restrictions Per Provider Order: No     Mobility  Bed Mobility Overal bed mobility: Needs Assistance Bed Mobility: Supine to Sit, Sit to Supine     Supine to sit: Mod assist, +2 for physical assistance, HOB elevated Sit to supine: Mod assist, +2 for safety/equipment   General bed mobility comments: modA for trunk support, cues to advance LE. assist to reposition trunk in bed and to bring LE back into bed    Transfers Overall transfer level: Needs  assistance Equipment used: Rolling walker (2 wheels) Transfers: Sit to/from Stand, Bed to chair/wheelchair/BSC Sit to Stand: Mod assist, +2 physical assistance, From elevated surface   Step pivot transfers: Min assist, +2 physical assistance       General transfer comment: modA of 2 to power up with cues for hand placement and use of armrests. once standing, minA to steady and manage RW for pivot. poor hip extension    Ambulation/Gait Ambulation/Gait assistance: Min assist, +2 safety/equipment Gait Distance (Feet): 5 Feet Assistive device: Rolling walker (2 wheels) Gait Pattern/deviations: Decreased stride length, Step-to pattern, Trunk flexed, Shuffle Gait velocity: decreased     General Gait Details: pt with small steps with minimal clearance. no overt buckling but poor posture, hip extension, cues for posture and positioning in RW.   Stairs             Wheelchair Mobility     Tilt Bed    Modified Rankin (Stroke Patients Only)       Balance Overall balance assessment: Needs assistance Sitting-balance support: Feet supported, Bilateral upper extremity supported Sitting balance-Leahy Scale: Poor Sitting balance - Comments: able to maintain without UE support, not challenged dynamically   Standing balance support: Bilateral upper extremity supported Standing balance-Leahy Scale: Poor Standing balance comment: dependent on BUE support, no overt buckling or LOB                            Communication Communication Communication: No apparent difficulties  Cognition Arousal: Alert Behavior During Therapy: WFL for tasks assessed/performed   PT - Cognitive impairments: No apparent impairments  PT - Cognition Comments: slow to respond at times, but able to follow commands in session Following commands: Intact      Cueing Cueing Techniques: Verbal cues, Gestural cues  Exercises      General Comments General  comments (skin integrity, edema, etc.): VSS on 2L, significant pitting edema      Pertinent Vitals/Pain Pain Assessment Pain Assessment: Faces Faces Pain Scale: Hurts little more Pain Location: generalized discomfort with ROM Pain Descriptors / Indicators: Aching, Discomfort, Grimacing, Spasm Pain Intervention(s): Limited activity within patient's tolerance, Monitored during session, Repositioned     PT Goals (current goals can now be found in the care plan section) Acute Rehab PT Goals Patient Stated Goal: Get well PT Goal Formulation: With patient Time For Goal Achievement: 12/02/23 Potential to Achieve Goals: Good Progress towards PT goals: Progressing toward goals    Frequency    Min 2X/week       AM-PAC PT 6 Clicks Mobility   Outcome Measure  Help needed turning from your back to your side while in a flat bed without using bedrails?: A Little Help needed moving from lying on your back to sitting on the side of a flat bed without using bedrails?: A Lot Help needed moving to and from a bed to a chair (including a wheelchair)?: A Lot Help needed standing up from a chair using your arms (e.g., wheelchair or bedside chair)?: A Lot Help needed to walk in hospital room?: Total Help needed climbing 3-5 steps with a railing? : Total 6 Click Score: 11    End of Session Equipment Utilized During Treatment: Oxygen  Activity Tolerance: Patient tolerated treatment well;Patient limited by pain Patient left: in bed;with call bell/phone within reach;with bed alarm set Nurse Communication: Mobility status PT Visit Diagnosis: Unsteadiness on feet (R26.81);Other abnormalities of gait and mobility (R26.89);Muscle weakness (generalized) (M62.81);Difficulty in walking, not elsewhere classified (R26.2)     Time: 8694-8652 PT Time Calculation (min) (ACUTE ONLY): 42 min  Charges:    $Gait Training: 8-22 mins $Therapeutic Exercise: 8-22 mins PT General Charges $$ ACUTE PT VISIT: 1  Visit                     Izetta Call, PT, DPT   Acute Rehabilitation Department Office [phone] Secure Chat Communication Preferred   Izetta JULIANNA Call 11/24/2023, 1:54 PM

## 2023-11-25 ENCOUNTER — Inpatient Hospital Stay (HOSPITAL_COMMUNITY)

## 2023-11-25 DIAGNOSIS — I5023 Acute on chronic systolic (congestive) heart failure: Secondary | ICD-10-CM | POA: Diagnosis not present

## 2023-11-25 DIAGNOSIS — R197 Diarrhea, unspecified: Secondary | ICD-10-CM | POA: Diagnosis not present

## 2023-11-25 LAB — BASIC METABOLIC PANEL WITH GFR
Anion gap: 11 (ref 5–15)
BUN: 30 mg/dL — ABNORMAL HIGH (ref 8–23)
CO2: 25 mmol/L (ref 22–32)
Calcium: 8.3 mg/dL — ABNORMAL LOW (ref 8.9–10.3)
Chloride: 97 mmol/L — ABNORMAL LOW (ref 98–111)
Creatinine, Ser: 1.95 mg/dL — ABNORMAL HIGH (ref 0.44–1.00)
GFR, Estimated: 27 mL/min — ABNORMAL LOW (ref >60)
Glucose, Bld: 94 mg/dL (ref 70–99)
Potassium: 3.8 mmol/L (ref 3.5–5.1)
Sodium: 133 mmol/L — ABNORMAL LOW (ref 135–145)

## 2023-11-25 LAB — CBC
HCT: 31.2 % — ABNORMAL LOW (ref 36.0–46.0)
Hemoglobin: 9.8 g/dL — ABNORMAL LOW (ref 12.0–15.0)
MCH: 26.6 pg (ref 26.0–34.0)
MCHC: 31.4 g/dL (ref 30.0–36.0)
MCV: 84.8 fL (ref 80.0–100.0)
Platelets: 133 K/uL — ABNORMAL LOW (ref 150–400)
RBC: 3.68 MIL/uL — ABNORMAL LOW (ref 3.87–5.11)
RDW: 15.3 % (ref 11.5–15.5)
WBC: 8.5 K/uL (ref 4.0–10.5)
nRBC: 0 % (ref 0.0–0.2)

## 2023-11-25 LAB — HEPARIN LEVEL (UNFRACTIONATED)
Heparin Unfractionated: 0.21 [IU]/mL — ABNORMAL LOW (ref 0.30–0.70)
Heparin Unfractionated: 0.78 [IU]/mL — ABNORMAL HIGH (ref 0.30–0.70)
Heparin Unfractionated: 0.48 [IU]/mL (ref 0.30–0.70)

## 2023-11-25 LAB — GLUCOSE, CAPILLARY
Glucose-Capillary: 97 mg/dL (ref 70–99)
Glucose-Capillary: 97 mg/dL (ref 70–99)
Glucose-Capillary: 107 mg/dL — ABNORMAL HIGH (ref 70–99)
Glucose-Capillary: 115 mg/dL — ABNORMAL HIGH (ref 70–99)

## 2023-11-25 MED ORDER — FUROSEMIDE 10 MG/ML IJ SOLN
120.0000 mg | Freq: Three times a day (TID) | INTRAVENOUS | Status: DC
  Administered 2023-11-25 – 2023-11-28 (×11): 120 mg via INTRAVENOUS
  Filled 2023-11-25: qty 2
  Filled 2023-11-25 (×2): qty 10
  Filled 2023-11-25 (×3): qty 12
  Filled 2023-11-25 (×2): qty 120
  Filled 2023-11-25: qty 10
  Filled 2023-11-25: qty 2
  Filled 2023-11-25 (×2): qty 10
  Filled 2023-11-25 (×2): qty 12
  Filled 2023-11-25: qty 10
  Filled 2023-11-25: qty 12

## 2023-11-25 MED ORDER — HEPARIN BOLUS VIA INFUSION
1000.0000 [IU] | Freq: Once | INTRAVENOUS | Status: AC
Start: 2023-11-25 — End: 2023-11-25
  Administered 2023-11-25: 1000 [IU] via INTRAVENOUS
  Filled 2023-11-25: qty 1000

## 2023-11-25 MED ORDER — FREE WATER
250.0000 mL | Freq: Once | Status: AC
  Administered 2023-11-25: 250 mL via ORAL

## 2023-11-25 MED ORDER — ASPIRIN 81 MG PO CHEW
81.0000 mg | CHEWABLE_TABLET | ORAL | Status: AC
  Administered 2023-11-26: 81 mg via ORAL
  Filled 2023-11-25: qty 1

## 2023-11-25 NOTE — Plan of Care (Signed)
  Problem: Education: Goal: Ability to describe self-care measures that may prevent or decrease complications (Diabetes Survival Skills Education) will improve Outcome: Progressing Goal: Individualized Educational Video(s) Outcome: Progressing   Problem: Coping: Goal: Ability to adjust to condition or change in health will improve Outcome: Progressing   Problem: Health Behavior/Discharge Planning: Goal: Ability to identify and utilize available resources and services will improve Outcome: Progressing Goal: Ability to manage health-related needs will improve Outcome: Progressing   Problem: Skin Integrity: Goal: Risk for impaired skin integrity will decrease Outcome: Progressing   Problem: Tissue Perfusion: Goal: Adequacy of tissue perfusion will improve Outcome: Progressing

## 2023-11-25 NOTE — H&P (View-Only) (Signed)
 Progress Note  Patient Name: Stephanie [name] Date of Encounter: 11/25/2023 [facility] Cardiologist: Jayson Sierras, MD   Interval Summary    Still very immobile with volume overload   Vital Signs Vitals:   11/24/23 1955 11/24/23 2326 11/25/23 0420 11/25/23 0735  BP: 109/69 110/69 114/69 115/71  Pulse: 76 76 78 75  Resp: 19 15 16 16   Temp: (!) 97.4 F (36.3 C) (!) 97.4 F (36.3 C) (!) 97.4 F (36.3 C) (!) 96.7 F (35.9 C)  TempSrc: Oral Oral Oral Axillary  SpO2: 96% 99% 97% 100%  Weight:   113.4 kg   Height:        Intake/Output Summary (Last 24 hours) at 11/25/2023 0926 Last data filed at 11/25/2023 0737 Gross per 24 hour  Intake 1291.56 ml  Output 850 ml  Net 441.56 ml      11/25/2023    4:20 AM 11/24/2023    3:59 AM 11/23/2023    5:14 AM  Last 3 Weights  Weight (lbs) 250 lb 253 lb 4.9 oz 250 lb 7.1 oz  Weight (kg) 113.399 kg 114.9 kg 113.6 kg      Telemetry/ECG  Normal sinus rhythm with heart rates in the 70s to 80s- Personally Reviewed  Physical Exam  GEN: No acute distress.  Alert and orientated.  On 1 L of oxygen  this is the patient's baseline requirement. Neck: Unable to assess JVD due to body habitus Cardiac: RRR, no murmurs, rubs, or gallops.  Chest tender to palpation Respiratory: Minimal bibasilar crackles GI: Soft, nontender, abdomen distended MS: 2+ bilateral lower extremity edema.  Also has swelling in arms.  Assessment & Plan    Acute on chronic systolic heart failure AKI Patient was hospitalized with shortness of breath and lower extremity edema.  Prior to admission the patient was on 40 mg twice daily of torsemide . TTE on 11/17/2023 showed a reduced LVEF of 35 to 40%, regional wall motion abnormalities, mildly reduced RV function, IVC with greater than 50% respiratory variability, large pleural effusion, and normal pulmonary artery systolic pressure.  Prior echo on 4/25 showed an LVEF of 40%. - increase lasix  to 120 mg tid. Follow Cr   Order chest x-ray Not a candidate for a left heart cath today with elevated creatinine. Planning on right heart cath on Friday to assess if patient needs Lasix .  It is somewhat difficult to assess volume status given patient body habitus and hypoalbuminemia contributing to swelling. GDMT Is a poor candidate for SGLT2 with frequent yeast infections. Stop Entresto  24-26 twice daily due to worsening creatinine. Continue bisoprolol  10 mg daily   DVT Continue IV heparin . Hold home Eliquis .  Plan to restart following cath.   Hypertension Management per heart failure above.   Hypoalbuminemia Anasarca Albumin this a.m. was 2.2.  Suspect this may be contributing to the patient's generalized swelling.  Patient reported poor oral intake.  The patient was to have a degree of nephrotic syndrome as the UA was also positive for protein.  Increase lasix  to 120 mg tid and plan right heart cath only on Friday. No contrast. Suspect baseline Cr will end up at 2-2.5 and does not need left heart cath at this time     Coronary artery calcifications Hyperlipidemia Coronary artery calcification seen on chest CT on 12/2022 On 11/18/23 LDL was 64.  Continue Lipitor  80 mg daily Continue Zetia 10 mg daily No need for aspirin  as is on Eliquis  for DVT at home.    For questions or  updates, please contact [facility] Please consult [email or web address] for contact info under        Signed, Maude Emmer, MD

## 2023-11-25 NOTE — Progress Notes (Signed)
 PHARMACY - ANTICOAGULATION  Pharmacy Consult for heparin  (Eliquis  PTA) Indication: hx DVT   Allergies  Allergen Reactions   Tetracyclines & Related Anaphylaxis and Rash   Banana Hives and Nausea And Vomiting   Jardiance  [Empagliflozin ]     Yeast infections, fatigue   Penicillins Rash and Other (See Comments)   Patient Measurements: Height: 5' 8 (172.7 cm) Weight: 113.4 kg (250 lb) IBW/kg (Calculated) : 63.9 HEPARIN  DW (KG): 95.1  Vital Signs: Temp: 97.4 F (36.3 C) (10/09 0420) Temp Source: Oral (10/09 0420) BP: 114/69 (10/09 0420) Pulse Rate: 78 (10/09 0420)  Labs: Recent Labs     0000 11/22/23 1900 11/23/23 0500 11/23/23 0700 11/23/23 1040 11/24/23 0150 11/24/23 1345 11/25/23 0554 11/25/23 0555  HGB   < >  --  10.9*  --   --  10.3*  --  9.8*  --   HCT  --   --  34.6*  --   --  32.6*  --  31.2*  --   PLT  --   --  163  --   --  151  --  133*  --   APTT  --  117*  --   --  123*  --   --   --   --   HEPARINUNFRC  --  0.61  --    < >  --  0.42 0.39  --  0.21*  CREATININE  --   --  1.94*  --   --  1.93*  --  1.95*  --    < > = values in this interval not displayed.   Estimated Creatinine Clearance: 34 mL/min (A) (by C-G formula based on SCr of 1.95 mg/dL (H)).  Assessment: 73 y.o. female with history of bilateral DVTs in 2017 taking Eliquis  PTA (last dose 10/1 AM). Pharmacy consulted to dose heparin  pending cath.  Heparin  level below goal on 700 units/hr Plans for cath on 10/10  Goal of Therapy:  Heparin  level 0.3-0.7 units/ml aPTT 66-102 seconds Monitor platelets by anticoagulation protocol: Yes   Plan:  -Heparin  bolus 1000 units x1 and increase infusion to 900 units/hr -Heparin  level in 8 hours and daily wth CBC daily   Prentice Poisson, PharmD Clinical Pharmacist **Pharmacist phone directory can now be found on [email or web address] ([email or web address]).  Listed under [facility].

## 2023-11-25 NOTE — Progress Notes (Signed)
 PHARMACY - ANTICOAGULATION  Pharmacy Consult for heparin  (Eliquis  PTA) Indication: hx DVT   Allergies  Allergen Reactions   Tetracyclines & Related Anaphylaxis and Rash   Banana Hives and Nausea And Vomiting   Jardiance  [Empagliflozin ]     Yeast infections, fatigue   Penicillins Rash and Other (See Comments)   Patient Measurements: Height: 5' 8 (172.7 cm) Weight: 113.4 kg (250 lb) IBW/kg (Calculated) : 63.9 HEPARIN  DW (KG): 95.1  Vital Signs: Temp: 97.8 F (36.6 C) (10/09 1612) Temp Source: Oral (10/09 1612) BP: 119/69 (10/09 1612) Pulse Rate: 81 (10/09 1612)  Labs: Recent Labs    11/23/23 0500 11/23/23 0700 11/23/23 1040 11/24/23 0150 11/24/23 1345 11/25/23 0554 11/25/23 0555 11/25/23 1911  HGB 10.9*  --   --  10.3*  --  9.8*  --   --   HCT 34.6*  --   --  32.6*  --  31.2*  --   --   PLT 163  --   --  151  --  133*  --   --   APTT  --   --  123*  --   --   --   --   --   HEPARINUNFRC  --    < >  --  0.42 0.39  --  0.21* 0.78*  CREATININE 1.94*  --   --  1.93*  --  1.95*  --   --    < > = values in this interval not displayed.   Estimated Creatinine Clearance: 34 mL/min (A) (by C-G formula based on SCr of 1.95 mg/dL (H)).  Assessment: 73 y.o. female with history of bilateral DVTs in 2017 taking Eliquis  PTA (last dose 10/1 AM). Pharmacy consulted to dose heparin  pending cath.  Heparin  level below goal on 700 units/hr Plans for cath on 10/10  Goal of Therapy:  Heparin  level 0.3-0.7 units/ml aPTT 66-102 seconds Monitor platelets by anticoagulation protocol: Yes   Plan:  -Heparin  bolus 1000 units x1 and increase infusion to 900 units/hr -Heparin  level in 8 hours and daily wth CBC daily   Prentice Poisson, PharmD Clinical Pharmacist **Pharmacist phone directory can now be found on [email or web address] ([email or web address]).  Listed under [facility].  ____________________________________________  10/9 PM update - HL 0.78 above goal on 900 units/hr.  Night RN wasn't sure how it  was drawn.  Repeat HL 0.48 therapeutic (confirmed drawn from peripheral stick, not PICC) on 900 units/hr.  No changes, F/U 8h HL w/ AM labs.  Maurilio Fila, PharmD Clinical Pharmacist 11/25/2023  10:06 PM

## 2023-11-25 NOTE — TOC Progression Note (Signed)
 Transition of Care [facility]) - Progression Note    Patient Details  Name: Stephanie [name] MRN: [id number] Date of Birth: 28-Apr-1950  [facility] CM/SW Contact  Isaiah Public, LCSWA Phone Number: 11/25/2023, 3:03 PM  Clinical Narrative:     Patient has SNF bed [facility] when medically ready. CSW following to start insurance authorization for patient closer to patient being medically ready.  Expected Discharge Plan:  (TBD) Barriers to Discharge: Continued Medical Work up               Expected Discharge Plan and Services In-house Referral: Clinical Social Work     Living arrangements for the past 2 months: Single Family Home                                       Social Drivers of Health (SDOH) Interventions SDOH Screenings   Food Insecurity: No Food Insecurity (11/17/2023)  Housing: Low Risk  (11/17/2023)  Transportation Needs: No Transportation Needs (11/17/2023)  Utilities: Not At Risk (11/17/2023)  Depression (PHQ2-9): Low Risk  (09/14/2023)  Social Connections: Moderately Isolated (11/17/2023)  Tobacco Use: Medium Risk (11/16/2023)    Readmission Risk Interventions     No data to display

## 2023-11-25 NOTE — Progress Notes (Signed)
 Progress Note  Patient Name: Stephanie [name] Date of Encounter: 11/25/2023 [facility] Cardiologist: Jayson Sierras, MD   Interval Summary   Reported some diarrhea overnight.  Feels like shortness of breath is improving slightly.  Vital Signs Vitals:   11/24/23 1955 11/24/23 2326 11/25/23 0420 11/25/23 0735  BP: 109/69 110/69 114/69 115/71  Pulse: 76 76 78 75  Resp: 19 15 16 16   Temp: (!) 97.4 F (36.3 C) (!) 97.4 F (36.3 C) (!) 97.4 F (36.3 C) (!) 96.7 F (35.9 C)  TempSrc: Oral Oral Oral Axillary  SpO2: 96% 99% 97% 100%  Weight:   113.4 kg   Height:        Intake/Output Summary (Last 24 hours) at 11/25/2023 0925 Last data filed at 11/25/2023 0737 Gross per 24 hour  Intake 1291.56 ml  Output 850 ml  Net 441.56 ml      11/25/2023    4:20 AM 11/24/2023    3:59 AM 11/23/2023    5:14 AM  Last 3 Weights  Weight (lbs) 250 lb 253 lb 4.9 oz 250 lb 7.1 oz  Weight (kg) 113.399 kg 114.9 kg 113.6 kg      Telemetry/ECG  Normal sinus rhythm with heart rates in the 60s to 70- Personally Reviewed  Physical Exam  GEN: No acute distress.  Alert and orientated. Neck: Unable to assess JVD due to body habitus. Cardiac: RRR, no murmurs, rubs, or gallops.  Respiratory: Bilateral crackles GI: Soft, had slight tenderness throughout the abdomen.  Abdomen distended. MS: 2+ bilateral lower extremity edema.  Also has swelling in hands.  Assessment & Plan   Acute on chronic systolic heart failure AKI on CKD Patient was hospitalized with shortness of breath and lower extremity edema.  Prior to admission the patient was on 40 mg twice daily of torsemide . TTE on 11/17/2023 showed a reduced LVEF of 35 to 40%, regional wall motion abnormalities, mildly reduced RV function, IVC with greater than 50% respiratory variability, large pleural effusion, and normal pulmonary artery systolic pressure.  Prior echo on 4/25 showed an LVEF of 40%. The patient was felt to be volume overloaded.  The  patient was started on 120 mg IV Lasix  in dextrose  twice daily and has remained on this for the past 5 days. Chest x-ray on 11/24/2023 showed Mild bilateral interstitial opacities suggestive of interstitial edema, with indistinct pulmonary vasculature. I's and O's since admission are net out 7.295 L.  The patient's weight is down over 30 pounds. Creatinine over the past 5 days has increased from about 1.5 to 1.95 this a.m.  The patient's baseline creatinine appears to be between 1 and 1.2. Dr. Nishan increased Lasix  to 120 mg 3 times daily. Not a candidate for a left heart cath today with elevated creatinine. Planning on right heart cath on Friday to assess if patient needs Lasix .  It is somewhat difficult to assess volume status given patient body habitus and hypoalbuminemia contributing to swelling. GDMT Is a poor candidate for SGLT2 with frequent yeast infections. Stop Entresto  24-26 twice daily due to worsening creatinine. Continue bisoprolol  10 mg daily     DVT Continue IV heparin . Hold home Eliquis .  Plan to restart following cath.     Hypertension Management per heart failure above.     Hypoalbuminemia Anasarca Albumin this a.m. was 2.2.  Suspect this may be contributing to the patient's generalized swelling.  Patient reported poor oral intake.  The patient may have a degree of nephrotic syndrome as the  UA was also positive for protein. Management per primary.     Coronary artery calcifications Hyperlipidemia Coronary artery calcification seen on chest CT on 12/2022 On 11/18/23 LDL was 64.  Continue Lipitor  80 mg daily Continue Zetia 10 mg daily No need for aspirin  as is on Eliquis  for DVT at home    For questions or updates, please contact [facility] Please consult [email or web address] for contact info under        Signed, Johnathan Heskett, PA-C

## 2023-11-25 NOTE — Progress Notes (Signed)
 Progress Note  Patient Name: Stephanie [name] Date of Encounter: 11/25/2023 [facility] Cardiologist: Jayson Sierras, MD   Interval Summary    Still very immobile with volume overload   Vital Signs Vitals:   11/24/23 1955 11/24/23 2326 11/25/23 0420 11/25/23 0735  BP: 109/69 110/69 114/69 115/71  Pulse: 76 76 78 75  Resp: 19 15 16 16   Temp: (!) 97.4 F (36.3 C) (!) 97.4 F (36.3 C) (!) 97.4 F (36.3 C) (!) 96.7 F (35.9 C)  TempSrc: Oral Oral Oral Axillary  SpO2: 96% 99% 97% 100%  Weight:   113.4 kg   Height:        Intake/Output Summary (Last 24 hours) at 11/25/2023 0926 Last data filed at 11/25/2023 0737 Gross per 24 hour  Intake 1291.56 ml  Output 850 ml  Net 441.56 ml      11/25/2023    4:20 AM 11/24/2023    3:59 AM 11/23/2023    5:14 AM  Last 3 Weights  Weight (lbs) 250 lb 253 lb 4.9 oz 250 lb 7.1 oz  Weight (kg) 113.399 kg 114.9 kg 113.6 kg      Telemetry/ECG  Normal sinus rhythm with heart rates in the 70s to 80s- Personally Reviewed  Physical Exam  GEN: No acute distress.  Alert and orientated.  On 1 L of oxygen  this is the patient's baseline requirement. Neck: Unable to assess JVD due to body habitus Cardiac: RRR, no murmurs, rubs, or gallops.  Chest tender to palpation Respiratory: Minimal bibasilar crackles GI: Soft, nontender, abdomen distended MS: 2+ bilateral lower extremity edema.  Also has swelling in arms.  Assessment & Plan    Acute on chronic systolic heart failure AKI Patient was hospitalized with shortness of breath and lower extremity edema.  Prior to admission the patient was on 40 mg twice daily of torsemide . TTE on 11/17/2023 showed a reduced LVEF of 35 to 40%, regional wall motion abnormalities, mildly reduced RV function, IVC with greater than 50% respiratory variability, large pleural effusion, and normal pulmonary artery systolic pressure.  Prior echo on 4/25 showed an LVEF of 40%. - increase lasix  to 120 mg tid. Follow Cr   Order chest x-ray Not a candidate for a left heart cath today with elevated creatinine. Planning on right heart cath on Friday to assess if patient needs Lasix .  It is somewhat difficult to assess volume status given patient body habitus and hypoalbuminemia contributing to swelling. GDMT Is a poor candidate for SGLT2 with frequent yeast infections. Stop Entresto  24-26 twice daily due to worsening creatinine. Continue bisoprolol  10 mg daily   DVT Continue IV heparin . Hold home Eliquis .  Plan to restart following cath.   Hypertension Management per heart failure above.   Hypoalbuminemia Anasarca Albumin this a.m. was 2.2.  Suspect this may be contributing to the patient's generalized swelling.  Patient reported poor oral intake.  The patient was to have a degree of nephrotic syndrome as the UA was also positive for protein.  Increase lasix  to 120 mg tid and plan right heart cath only on Friday. No contrast. Suspect baseline Cr will end up at 2-2.5 and does not need left heart cath at this time     Coronary artery calcifications Hyperlipidemia Coronary artery calcification seen on chest CT on 12/2022 On 11/18/23 LDL was 64.  Continue Lipitor  80 mg daily Continue Zetia 10 mg daily No need for aspirin  as is on Eliquis  for DVT at home.    For questions or  updates, please contact [facility] Please consult [email or web address] for contact info under        Signed, Maude Emmer, MD

## 2023-11-25 NOTE — Progress Notes (Addendum)
 [facility]  PROGRESS NOTE  Stephanie [name] FMW:[id number] DOB: August 04, 1950 DOA: 11/16/2023 PCP: Roni, [facility]   Brief HPI:   73 y.o. female with medical history significant of systolic heart failure, hypothyroidism, hyperlipidemia, COPD/asthma, history of DVT, fibromyalgia, GERD and type 2 diabetes mellitus with nephropathy; who presented to the hospital secondary to increased lower extremity swelling, shortness of breath with activity/orthopnea and weight gain.  Patient reports symptom has been present for the last 3 to 4 days and worsening; she was scheduled to be seen by cardiology service as an outpatient but unfortunately due to worsening of her symptoms did not make it and presented to the emergency department instead.     Of note, patient reports to be compliant with her medications.   ED:  Demonstrating elevated BNP, signs of fluid overload on physical exam and a chest x-ray with vascular congestion/interstitial edema.  IV Lasix  was started      Assessment/Plan:   Acute on chronic systolic heart failure - Hemodynamically stable, satting 100% on 1 L of oxygen  -Repeat 2d echo with EF 35-40% with regional WMA, mildly reduced RV function and severely enlarged RV size  - h/o EJF around 40% on previous echo. - Presenting with complaint of weight gain, orthopnea, lower extremity swelling and elevated BNP -presenting BNP 14,798.0 - Cardiology following, Lasix  dose increased to 120 mg 3 times daily - Continue  Bisoprolol , Entresto  stopped due to worsening creatinine -continued on heparin  gtt. plan was to get LHC/RHC in the hospital however, currently on hold due to worsening CHF and renal insufficiency -Plan for outpatient cardiac cath as per Dr. Ladona  Diarrhea -Resolved -Patient had full episodes of loose stools yesterday -No loose stools this morning   COPD/asthma - Appears to be stable and currently no signs of acute exacerbation - Continue bronchodilator  management.   Type 2 diabetes with nephropathy - Update A1c - Checking CBG q. ACHS, SSI coverage   Acute on Chronic kidney disease stage IIIb  - With a slightly increase in patient's creatinine from recent values most likely in the setting of decreased perfusion with CHF exacerbation -Cr higher today to 1.9. On IV lasix  per Cardiology Entresto  stopped due to worsening creatinine - Follow renal function in a.m.   Hypertension - Stable on Lasix  drip, Entresto  - Continue current antihypertensive agent -Holding home medication spironolactone   GERD - Continue PPI   Hypothyroidism - TSH: 6.73 -Checking free T3, T4 - Continue Synthroid .   Depression/anxiety/neuropathy and Fibromyalgia - Continue Cymbalta  and Atarax  - Continue Lyrica  and Robaxin  - Complained of increased pain this AM. Added PRN IV dilaudid  for breakthrough    History of DVT and prior CVA - No acute neurologic deficit - Continue heparin  gtt for now    Hyperlipidemia - Resume outpatient Repatha  after discharge   Constipation -Pt reports good results recently with cathartics    Medications     arformoterol  15 mcg Nebulization BID   And   umeclidinium bromide  1 puff Inhalation Daily   vitamin C   500 mg Oral Daily   atorvastatin   80 mg Oral Daily   bisoprolol   10 mg Oral Daily   Chlorhexidine  Gluconate Cloth  6 each Topical Daily   cycloSPORINE  1 drop Both Eyes BID   DULoxetine   60 mg Oral BID   insulin  aspart  0-15 Units Subcutaneous TID WC   insulin  aspart  0-5 Units Subcutaneous QHS   levothyroxine   100 mcg Oral QAC breakfast  loratadine   10 mg Oral Daily   pantoprazole   40 mg Oral Daily   potassium chloride   40 mEq Oral Daily   pregabalin   75 mg Oral BID   sodium chloride  flush  10-40 mL Intracatheter Q12H   traZODone   150 mg Oral QHS   ursodiol  300 mg Oral BID     Data Reviewed:   CBG:  Recent Labs  Lab 11/24/23 1151 11/24/23 1605 11/24/23 1643 11/24/23 2131 11/25/23 0733   GLUCAP 78 73 89 85 97    SpO2: 100 % O2 Flow Rate (L/min): 1 L/min FiO2 (%): 28 %    Vitals:   11/24/23 1955 11/24/23 2326 11/25/23 0420 11/25/23 0735  BP: 109/69 110/69 114/69 115/71  Pulse: 76 76 78 75  Resp: 19 15 16 16   Temp: (!) 97.4 F (36.3 C) (!) 97.4 F (36.3 C) (!) 97.4 F (36.3 C) (!) 96.7 F (35.9 C)  TempSrc: Oral Oral Oral Axillary  SpO2: 96% 99% 97% 100%  Weight:   113.4 kg   Height:          Data Reviewed:  Basic Metabolic Panel: Recent Labs  Lab 11/18/23 1745 11/19/23 0712 11/19/23 0900 11/19/23 1518 11/21/23 0709 11/21/23 1442 11/22/23 0652 11/23/23 0500 11/24/23 0150 11/25/23 0554  NA 132*  131*   < >  --    < > 134*  --  131* 133* 132* 133*  K 3.9  3.9   < >  --    < > 3.6  --  3.6 3.9 3.8 3.8  CL 99  98   < >  --    < > 98  --  96* 96* 96* 97*  CO2 26  26   < >  --    < > 28  --  26 25 25 25   GLUCOSE 145*  143*   < >  --    < > 72  --  89 106* 111* 94  BUN 27*  28*   < >  --    < > 28*  --  29* 30* 30* 30*  CREATININE 1.58*  1.54*   < >  --    < > 1.62*  --  1.85* 1.94* 1.93* 1.95*  CALCIUM  8.7*  8.5*   < >  --    < > 8.8*  --  8.3* 8.6* 8.5* 8.3*  MG 1.6*  1.7  --  1.6*  --   --  1.7 2.0  --  2.1  --    < > = values in this interval not displayed.    CBC: Recent Labs  Lab 11/21/23 0709 11/22/23 0652 11/23/23 0500 11/24/23 0150 11/25/23 0554  WBC 8.4 7.5 8.3 9.8 8.5  HGB 11.4* 10.3* 10.9* 10.3* 9.8*  HCT 35.4* 32.3* 34.6* 32.6* 31.2*  MCV 84.3 85.0 84.2 84.2 84.8  PLT 190 166 163 151 133*    LFT Recent Labs  Lab 11/22/23 0652 11/24/23 0150  AST 28 32  ALT 19 20  ALKPHOS 174* 184*  BILITOT 2.0* 2.3*  PROT 6.9 7.2  ALBUMIN 2.0* 2.2*     Antibiotics: Anti-infectives (From admission, onward)    None        DVT prophylaxis: Heparin   Code Status: Full code  Family Communication: No family at bedside   CONSULTS cardiology   Subjective   Had diarrhea yesterday.  Diarrhea has resolved this  morning.  Complains of intermittent abdominal pain   Objective  Physical Examination:  General-appears in no acute distress Heart-S1-S2, regular, no murmur auscultated Lungs-clear to auscultation bilaterally, no wheezing or crackles auscultated Abdomen-soft, nontender, no organomegaly Extremities-bilateral 1+ edema in the lower extremities Neuro-alert, oriented x3, no focal deficit noted   Status is: Inpatient:             Stephanie [name]   [facility] If 7PM-7AM, please contact night-coverage at [email or web address], Office  [phone]   11/25/2023, 8:23 AM  LOS: 9 days

## 2023-11-26 ENCOUNTER — Encounter (HOSPITAL_COMMUNITY)
Admission: EM | Disposition: A | Discharge status: Skilled Nursing Facility | Payer: Self-pay | Source: Home / Self Care | Attending: Family Medicine

## 2023-11-26 DIAGNOSIS — I5082 Biventricular heart failure: Secondary | ICD-10-CM | POA: Diagnosis not present

## 2023-11-26 DIAGNOSIS — I5023 Acute on chronic systolic (congestive) heart failure: Secondary | ICD-10-CM | POA: Diagnosis not present

## 2023-11-26 HISTORY — PX: RIGHT HEART CATH: CATH118263

## 2023-11-26 LAB — GLUCOSE, CAPILLARY
Glucose-Capillary: 123 mg/dL — ABNORMAL HIGH (ref 70–99)
Glucose-Capillary: 127 mg/dL — ABNORMAL HIGH (ref 70–99)
Glucose-Capillary: 125 mg/dL — ABNORMAL HIGH (ref 70–99)
Glucose-Capillary: 132 mg/dL — ABNORMAL HIGH (ref 70–99)

## 2023-11-26 LAB — CBC
HCT: 31.9 % — ABNORMAL LOW (ref 36.0–46.0)
Hemoglobin: 10.2 g/dL — ABNORMAL LOW (ref 12.0–15.0)
MCH: 27.1 pg (ref 26.0–34.0)
MCHC: 32 g/dL (ref 30.0–36.0)
MCV: 84.8 fL (ref 80.0–100.0)
Platelets: 125 K/uL — ABNORMAL LOW (ref 150–400)
RBC: 3.76 MIL/uL — ABNORMAL LOW (ref 3.87–5.11)
RDW: 15.3 % (ref 11.5–15.5)
WBC: 9.9 K/uL (ref 4.0–10.5)
nRBC: 0 % (ref 0.0–0.2)

## 2023-11-26 LAB — BASIC METABOLIC PANEL WITH GFR
Anion gap: 12 (ref 5–15)
BUN: 28 mg/dL — ABNORMAL HIGH (ref 8–23)
CO2: 24 mmol/L (ref 22–32)
Calcium: 8.4 mg/dL — ABNORMAL LOW (ref 8.9–10.3)
Chloride: 97 mmol/L — ABNORMAL LOW (ref 98–111)
Creatinine, Ser: 1.9 mg/dL — ABNORMAL HIGH (ref 0.44–1.00)
GFR, Estimated: 28 mL/min — ABNORMAL LOW (ref >60)
Glucose, Bld: 122 mg/dL — ABNORMAL HIGH (ref 70–99)
Potassium: 3.9 mmol/L (ref 3.5–5.1)
Sodium: 133 mmol/L — ABNORMAL LOW (ref 135–145)

## 2023-11-26 LAB — HEPARIN LEVEL (UNFRACTIONATED): Heparin Unfractionated: 0.76 [IU]/mL — ABNORMAL HIGH (ref 0.30–0.70)

## 2023-11-26 SURGERY — RIGHT HEART CATH

## 2023-11-26 MED ORDER — LIDOCAINE HCL (PF) 1 % IJ SOLN
INTRAMUSCULAR | Status: DC | PRN
  Administered 2023-11-26: 2 mL via INTRADERMAL

## 2023-11-26 MED ORDER — HYDRALAZINE HCL 20 MG/ML IJ SOLN: 10.0000 mg | INTRAMUSCULAR | Status: AC | PRN

## 2023-11-26 MED ORDER — LIDOCAINE HCL (PF) 1 % IJ SOLN
INTRAMUSCULAR | Status: AC
  Filled 2023-11-26: qty 30

## 2023-11-26 MED ORDER — ONDANSETRON HCL 4 MG/2ML IJ SOLN: 4.0000 mg | Freq: Four times a day (QID) | INTRAMUSCULAR | Status: DC | PRN

## 2023-11-26 MED ORDER — LABETALOL HCL 5 MG/ML IV SOLN: 10.0000 mg | INTRAVENOUS | Status: AC | PRN

## 2023-11-26 MED ORDER — APIXABAN 5 MG PO TABS
5.0000 mg | ORAL_TABLET | Freq: Two times a day (BID) | ORAL | Status: DC
  Administered 2023-11-26 – 2023-11-30 (×8): 5 mg via ORAL
  Filled 2023-11-26 (×8): qty 1

## 2023-11-26 MED ORDER — SODIUM CHLORIDE 0.9% FLUSH
3.0000 mL | Freq: Two times a day (BID) | INTRAVENOUS | Status: DC
  Administered 2023-11-26 – 2023-12-05 (×13): 3 mL via INTRAVENOUS
  Administered 2023-12-06: 10 mL via INTRAVENOUS

## 2023-11-26 MED ORDER — HEPARIN (PORCINE) IN NACL 1000-0.9 UT/500ML-% IV SOLN
INTRAVENOUS | Status: DC | PRN
  Administered 2023-11-26: 500 mL

## 2023-11-26 MED ORDER — ACETAMINOPHEN 325 MG PO TABS
650.0000 mg | ORAL_TABLET | ORAL | Status: DC | PRN
  Administered 2023-11-27 – 2023-12-03 (×5): 650 mg via ORAL
  Filled 2023-11-26: qty 2

## 2023-11-26 MED ORDER — SODIUM CHLORIDE 0.9 % IV SOLN: 250.0000 mL | INTRAVENOUS | Status: AC | PRN

## 2023-11-26 MED ORDER — SODIUM CHLORIDE 0.9% FLUSH: 3.0000 mL | INTRAVENOUS | Status: DC | PRN

## 2023-11-26 SURGICAL SUPPLY — 6 items
CATH SWAN GANZ 7F STRAIGHT (CATHETERS) IMPLANT
PACK CARDIAC CATHETERIZATION (CUSTOM PROCEDURE TRAY) ×1 IMPLANT
SHEATH PINNACLE 7F 10CM (SHEATH) IMPLANT
SHEATH PROBE COVER 6X72 (BAG) IMPLANT
TRANSDUCER W/STOPCOCK (MISCELLANEOUS) IMPLANT
TUBING ART PRESS 72 MALE/FEM (TUBING) IMPLANT

## 2023-11-26 NOTE — Plan of Care (Signed)

## 2023-11-26 NOTE — Progress Notes (Signed)
 5 loose watery stool over the night.

## 2023-11-26 NOTE — Progress Notes (Signed)
 [facility]  PROGRESS NOTE  Stephanie [name] FMW:[id number] DOB: [birth year] DOA: 11/16/2023 PCP: Roni, [facility]   Brief HPI:   73 y.o. female with medical history significant of systolic heart failure, hypothyroidism, hyperlipidemia, COPD/asthma, history of DVT, fibromyalgia, GERD and type 2 diabetes mellitus with nephropathy; who presented to the hospital secondary to increased lower extremity swelling, shortness of breath with activity/orthopnea and weight gain.  Patient reports symptom has been present for the last 3 to 4 days and worsening; she was scheduled to be seen by cardiology service as an outpatient but unfortunately due to worsening of her symptoms did not make it and presented to the emergency department instead.     Of note, patient reports to be compliant with her medications.   ED:  Demonstrating elevated BNP, signs of fluid overload on physical exam and a chest x-ray with vascular congestion/interstitial edema.  IV Lasix  was started      Assessment/Plan:   Acute on chronic systolic heart failure - Hemodynamically stable, satting 100% on 1 L of oxygen  -Repeat 2d echo with EF 35-40% with regional WMA, mildly reduced RV function and severely enlarged RV size  - h/o EJF around 40% on previous echo. - Presenting with complaint of weight gain, orthopnea, lower extremity swelling and elevated BNP -presenting BNP 14,798.0 - Cardiology following, Lasix  dose increased to 120 mg 3 times daily - Continue  Bisoprolol , Entresto  stopped due to worsening creatinine -continued on heparin  gtt. plan was to get LHC in the hospital however, currently on hold due to worsening CHF and renal insufficiency -Plan for outpatient cardiac cath as per Dr. Ladona - Underwent right heart cath, patient has end-stage biventricular failure.  Recommends palliative consult due to poor functional status and comorbidities.  No milrinone recommended. - Recommended palliative care  consult.  Diarrhea -Resolved -Patient had full episodes of loose stools yesterday -No loose stools this morning   COPD/asthma - Appears to be stable and currently no signs of acute exacerbation - Continue bronchodilator management.   Type 2 diabetes with nephropathy - Update A1c - Checking CBG q. ACHS, SSI coverage   Acute on Chronic kidney disease stage IIIb  - With a slightly increase in patient's creatinine from recent values most likely in the setting of decreased perfusion with CHF exacerbation -Cr higher today to 1.9. On IV lasix  per Cardiology Entresto  stopped due to worsening creatinine - Follow renal function in a.m.   Hypertension - Stable on Lasix  drip, Entresto  - Continue current antihypertensive agent -Holding home medication spironolactone   GERD - Continue PPI   Hypothyroidism - TSH: 6.73 -Checking free T3, T4 - Continue Synthroid .   Depression/anxiety/neuropathy and Fibromyalgia - Continue Cymbalta  and Atarax  - Continue Lyrica  and Robaxin  - Complained of increased pain this AM. Added PRN IV dilaudid  for breakthrough    History of DVT and prior CVA - No acute neurologic deficit - Continue heparin  gtt for now    Hyperlipidemia - Resume outpatient Repatha  after discharge   Constipation -Pt reports good results recently with cathartics    Medications     arformoterol  15 mcg Nebulization BID   And   umeclidinium bromide  1 puff Inhalation Daily   vitamin C   500 mg Oral Daily   atorvastatin   80 mg Oral Daily   bisoprolol   10 mg Oral Daily   Chlorhexidine  Gluconate Cloth  6 each Topical Daily   cycloSPORINE  1 drop Both Eyes BID   DULoxetine   60 mg Oral BID  insulin  aspart  0-15 Units Subcutaneous TID WC   insulin  aspart  0-5 Units Subcutaneous QHS   levothyroxine   100 mcg Oral QAC breakfast   loratadine   10 mg Oral Daily   pantoprazole   40 mg Oral Daily   potassium chloride   40 mEq Oral Daily   pregabalin   75 mg Oral BID   sodium  chloride flush  10-40 mL Intracatheter Q12H   traZODone   150 mg Oral QHS   ursodiol  300 mg Oral BID     Data Reviewed:   CBG:  Recent Labs  Lab 11/25/23 0733 11/25/23 1158 11/25/23 1555 11/25/23 2134 11/26/23 0743  GLUCAP 97 97 107* 115* 123*    SpO2: 100 % O2 Flow Rate (L/min): 2 L/min FiO2 (%): 28 %    Vitals:   11/25/23 2305 11/26/23 0533 11/26/23 0732 11/26/23 0740  BP: 102/68 125/81  112/86  Pulse: (!) 49 77 71 75  Resp: 19 16 13 16   Temp: 97.9 F (36.6 C) (!) 97.5 F (36.4 C)  (!) 97.4 F (36.3 C)  TempSrc: Oral Oral  Oral  SpO2: 97% 100% 100% 100%  Weight:   114.8 kg   Height:          Data Reviewed:  Basic Metabolic Panel: Recent Labs  Lab 11/19/23 0900 11/19/23 1518 11/21/23 0709 11/21/23 1442 11/22/23 0652 11/23/23 0500 11/24/23 0150 11/25/23 0554  NA  --    < > 134*  --  131* 133* 132* 133*  K  --    < > 3.6  --  3.6 3.9 3.8 3.8  CL  --    < > 98  --  96* 96* 96* 97*  CO2  --    < > 28  --  26 25 25 25   GLUCOSE  --    < > 72  --  89 106* 111* 94  BUN  --    < > 28*  --  29* 30* 30* 30*  CREATININE  --    < > 1.62*  --  1.85* 1.94* 1.93* 1.95*  CALCIUM   --    < > 8.8*  --  8.3* 8.6* 8.5* 8.3*  MG 1.6*  --   --  1.7 2.0  --  2.1  --    < > = values in this interval not displayed.    CBC: Recent Labs  Lab 11/22/23 0652 11/23/23 0500 11/24/23 0150 11/25/23 0554 11/26/23 0536  WBC 7.5 8.3 9.8 8.5 9.9  HGB 10.3* 10.9* 10.3* 9.8* 10.2*  HCT 32.3* 34.6* 32.6* 31.2* 31.9*  MCV 85.0 84.2 84.2 84.8 84.8  PLT 166 163 151 133* 125*    LFT Recent Labs  Lab 11/22/23 0652 11/24/23 0150  AST 28 32  ALT 19 20  ALKPHOS 174* 184*  BILITOT 2.0* 2.3*  PROT 6.9 7.2  ALBUMIN 2.0* 2.2*     Antibiotics: Anti-infectives (From admission, onward)    None        DVT prophylaxis: Heparin   Code Status: Full code  Family Communication: No family at bedside   CONSULTS cardiology   Subjective   Patient seen and examined,  underwent right heart cath today.  Denies chest pain or shortness of breath  Objective    Physical Examination:  General-appears in no acute distress Heart-S1-S2, regular, no murmur auscultated Lungs-clear to auscultation bilaterally, no wheezing or crackles auscultated Abdomen-soft, nontender, no organomegaly Extremities-bilateral 2+ edema in the lower extremities Neuro-alert, oriented x3, no focal deficit noted  Status is: Inpatient:             Sabas GORMAN Brod   [facility] If 7PM-7AM, please contact night-coverage at [email or web address], Office  [phone]   11/26/2023, 8:02 AM  LOS: 10 days

## 2023-11-26 NOTE — Evaluation (Signed)
 Clinical/Bedside Swallow Evaluation Patient Details  Name: Stephanie [name] MRN: [id number] Date of Birth: 05-18-1950  Today's Date: 11/26/2023 Time: SLP Start Time (ACUTE ONLY): 1412 SLP Stop Time (ACUTE ONLY): 1431 SLP Time Calculation (min) (ACUTE ONLY): 19 min  Past Medical History:  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   Type 2 diabetes mellitus (HCC)    Past Surgical History:  Past Surgical History:  Procedure Laterality Date   ABDOMINAL HYSTERECTOMY  partial   CARPAL TUNNEL RELEASE Right 1991   CATARACT EXTRACTION W/PHACO Right 05/08/2013   Procedure: CATARACT EXTRACTION PHACO AND INTRAOCULAR LENS PLACEMENT (IOC);  Surgeon: Cherene Mania, MD;  Location: [facility];  Service: Ophthalmology;  Laterality: Right;  CDE 10.31   CATARACT EXTRACTION W/PHACO Left 08/17/2013   Procedure: CATARACT EXTRACTION PHACO AND INTRAOCULAR LENS PLACEMENT (IOC);  Surgeon: Cherene Mania, MD;  Location: [facility];  Service: Ophthalmology;  Laterality: Left;  CDE:9.03   CHOLECYSTECTOMY  1971   COLONOSCOPY WITH PROPOFOL  N/A 01/06/2016   Dr. Shaaron: diverticulosis    DENTAL SURGERY     ESOPHAGEAL BRUSHING  08/29/2019   Procedure: ESOPHAGEAL BRUSHING;  Surgeon: Shaaron Lamar HERO, MD;  Location: [facility];  Service: Endoscopy;;   ESOPHAGOGASTRODUODENOSCOPY (EGD) WITH PROPOFOL  N/A 01/06/2016   Dr. Shaaron: normal s/p empiric dilation    ESOPHAGOGASTRODUODENOSCOPY (EGD) WITH PROPOFOL  N/A 08/29/2019   esophageal plaques vs medication residue adherent to tubular  esophagus s/p KOH brushing and dilation. Medium-sized hiatal hernia. + for candida. Diflucan .    EYE SURGERY     IR BONE MARROW BIOPSY & ASPIRATION  03/31/2022   MALONEY DILATION N/A 01/06/2016   Procedure: AGAPITO DILATION;  Surgeon: Lamar HERO Shaaron, MD;  Location: [facility];  Service: Endoscopy;  Laterality: N/A;   MALONEY DILATION N/A 08/29/2019   Procedure: AGAPITO DILATION;  Surgeon: Shaaron Lamar HERO, MD;  Location: [facility];  Service: Endoscopy;  Laterality: N/A;   REVERSE SHOULDER ARTHROPLASTY Right 08/06/2017   Procedure: RIGHT REVERSE SHOULDER ARTHROPLASTY;  Surgeon: Kay Kemps, MD;  Location: [facility] OR;  Service: Orthopedics;  Laterality: Right;   WRIST GANGLION EXCISION Left    HPI:  Stephanie [name] is a 73 y.o. female who presented 9/30 with lower extremity swelling, SOB, and weight gain. Dx acute on chronic systolic heart failure. 10/9 and 10/10 pt experienced significant choking episodes while eating; Swallowing consult placed 10/10. Pt reports hx of multiple EGDs and dilations; the last was 08/29/19 by Dr. Shaaron at [facility]. PMH includes: systolic heart failure, hypothyroidism, hyperlipidemia, COPD/asthma, history of DVT, fibromyalgia, GERD and type 2 diabetes mellitus with nephropathy.    Assessment / Plan / Recommendation  Clinical Impression  Pt presented with functional oropharyngeal swallow with normal mastication, the appearance of a brisk swallow response, and no s/s of aspiration. She reported hx of multiple esophageal dilations. She reports that recent choking episodes are c/w symptoms that precipitated prior EGDs. No SLP f/u is needed. Consider GI consult,  either while admitted or as OP.  D/W pt and RN. Our service will sign off.   SLP Visit Diagnosis: Dysphagia, unspecified (R13.10)    Aspiration Risk  No limitations    Diet Recommendation   Thin;Age appropriate regular  Medication Administration: Whole meds with liquid    Other  Recommendations Recommended  Consults: Consider GI evaluation Oral Care Recommendations: Oral care BID       Swallow Study   General HPI: Stephanie [name] is a 73 y.o. female who presented 9/30 with lower extremity swelling, SOB, and weight gain. Dx acute on chronic systolic heart failure. 10/9 and 10/10 pt experienced significant choking episodes while eating; Swallowing consult placed 10/10. Pt reports hx of multiple EGDs and dilations; the last was 08/29/19 by Dr. Shaaron at [facility]. PMH includes: systolic heart failure, hypothyroidism, hyperlipidemia, COPD/asthma, history of DVT, fibromyalgia, GERD and type 2 diabetes mellitus with nephropathy. Type of Study: Bedside Swallow Evaluation Previous Swallow Assessment: no Diet Prior to this Study: Regular;Thin liquids (Level 0) Temperature Spikes Noted: No Respiratory Status: Nasal cannula History of Recent Intubation: No Behavior/Cognition: Alert;Cooperative;Pleasant mood Oral Cavity Assessment: Within Functional Limits Oral Care Completed by SLP: No Oral Cavity - Dentition: Dentures, top;Dentures, bottom Vision: Functional for self-feeding Self-Feeding Abilities: Able to feed self Patient Positioning: Upright in bed Baseline Vocal Quality: Normal Volitional Cough: Strong Volitional Swallow: Able to elicit    Oral/Motor/Sensory Function Overall Oral Motor/Sensory Function: Within functional limits   Ice Chips Ice chips: Within functional limits   Thin Liquid Thin Liquid: Within functional limits    Nectar Thick Nectar Thick Liquid: Not tested   Honey Thick Honey Thick Liquid: Not tested   Puree Puree: Within functional limits   Solid     Solid: Within functional limits      Stephanie [name] 11/26/2023,2:48 PM   Derica Leiber L. Vona, MA CCC/SLP Clinical Specialist - Acute Care SLP Acute Rehabilitation Services Office number [phone]

## 2023-11-26 NOTE — Interval H&P Note (Signed)
 History and Physical Interval Note:  11/26/2023 8:22 AM  Stephanie [name]  has presented today for surgery, with the diagnosis of HEART FAILURE.  The various methods of treatment have been discussed with the patient and family. After consideration of risks, benefits and other options for treatment, the patient has consented to  Procedure(s): RIGHT HEART CATH (N/A) as a surgical intervention.  The patient's history has been reviewed, patient examined, no change in status, stable for surgery.  I have reviewed the patient's chart and labs.  Questions were answered to the patient's satisfaction.     Chaysen Tillman

## 2023-11-26 NOTE — TOC Progression Note (Signed)
 Transition of Care [facility]) - Progression Note    Patient Details  Name: Stephanie [name] MRN: [id number] Date of Birth: January 07, 1951  [facility] CM/SW Contact  Isaiah Public, LCSWA Phone Number: 11/26/2023, 12:41 PM  Clinical Narrative:     Patient has SNF bed at [facility] when medically ready. CSW following to start insurance authorization for patient closer to patient being medically ready.   Expected Discharge Plan:  (TBD) Barriers to Discharge: Continued Medical Work up               Expected Discharge Plan and Services In-house Referral: Clinical Social Work     Living arrangements for the past 2 months: Single Family Home                                       Social Drivers of Health (SDOH) Interventions SDOH Screenings   Food Insecurity: No Food Insecurity (11/17/2023)  Housing: Low Risk  (11/17/2023)  Transportation Needs: No Transportation Needs (11/17/2023)  Utilities: Not At Risk (11/17/2023)  Depression (PHQ2-9): Low Risk  (09/14/2023)  Social Connections: Moderately Isolated (11/17/2023)  Tobacco Use: Medium Risk (11/16/2023)    Readmission Risk Interventions     No data to display

## 2023-11-26 NOTE — Progress Notes (Signed)
 Progress Note  Patient Name: Stephanie [name] Date of Encounter: 11/26/2023 [facility] Cardiologist: Jayson Sierras, MD   Interval Summary    Still very immobile with volume overload post right heart cath see below   Vital Signs Vitals:   11/26/23 0839 11/26/23 0844 11/26/23 0849 11/26/23 0909  BP: (!) 114/102 126/81 (!) 129/108   Pulse: 83 82 82   Resp: (!) 31 (!) 25 (!) 27   Temp:    (!) 97.5 F (36.4 C)  TempSrc:    Oral  SpO2: 95%     Weight:      Height:        Intake/Output Summary (Last 24 hours) at 11/26/2023 0913 Last data filed at 11/26/2023 0436 Gross per 24 hour  Intake 730 ml  Output 1100 ml  Net -370 ml      11/26/2023    7:32 AM 11/25/2023    4:20 AM 11/24/2023    3:59 AM  Last 3 Weights  Weight (lbs) 253 lb 1.4 oz 250 lb 253 lb 4.9 oz  Weight (kg) 114.8 kg 113.399 kg 114.9 kg      Telemetry/ECG  Normal sinus rhythm with heart rates in the 70s to 80s- Personally Reviewed  Physical Exam  GEN: No acute distress.  Alert and orientated.  On 1 L of oxygen  this is the patient's baseline requirement. Neck: Unable to assess JVD due to body habitus Cardiac: RRR, no murmurs, rubs, or gallops.  Chest tender to palpation Respiratory: Minimal bibasilar crackles GI: Soft, nontender, abdomen distended MS: 2+ bilateral lower extremity edema.  Also has swelling in arms.  Assessment & Plan    Acute on chronic systolic heart failure AKI Patient was hospitalized with shortness of breath and lower extremity edema.  Prior to admission the patient was on 40 mg twice daily of torsemide . TTE on 11/17/2023 showed a reduced LVEF of 35 to 40%, regional wall motion abnormalities, mildly reduced RV function, IVC with greater than 50% respiratory variability, large pleural effusion, and normal pulmonary artery systolic pressure.  Prior echo on 4/25 showed an LVEF of 40%.  - Since admission weight 287-> 253.  - Continue IV lasix  120 mg tid - No left heart cath due  to A/CRF  - Right heart reviewed today with Dr Bensimohn. End stage bi ventricular failure he recommended palliative consult given poor functional status and co morbidities not iv milrinone. RA 27 mmHg, PA 48/26 mmHg CO 4 L/min, PCW 22 mmHg   GDMT Is a poor candidate for SGLT2 with frequent yeast infections. Stop Entresto  24-26 twice daily due to worsening creatinine. Continue bisoprolol  10 mg daily, iv lasix  tid    DVT Continue IV heparin . Hold home Eliquis .  Plan to restart following cath.   Hypertension Management per heart failure above.   Hypoalbuminemia Anasarca Albumin this a.m. was 2.2.  Suspect this may be contributing to the patient's generalized swelling.  Patient reported poor oral intake.  The patient was to have a degree of nephrotic syndrome as the UA was also positive for protein.  Increase lasix  to 120 mg tid Suspect baseline Cr will end up at 2-2.5 and does not need left heart cath at this time     Coronary artery calcifications Hyperlipidemia Coronary artery calcification seen on chest CT on 12/2022 On 11/18/23 LDL was 64.  Continue Lipitor  80 mg daily Continue Zetia 10 mg daily No need for aspirin  as is on Eliquis  for DVT at home.   See cath note  Dr Bensimohn 11/26/23  Palliative consult     For questions or updates, please contact [facility] Please consult [email or web address] for contact info under        Signed, Maude Emmer, MD

## 2023-11-26 NOTE — Progress Notes (Signed)
 PHARMACY - ANTICOAGULATION  Pharmacy Consult for heparin  (Eliquis  PTA) Indication: hx DVT   Allergies  Allergen Reactions   Tetracyclines & Related Anaphylaxis and Rash   Banana Hives and Nausea And Vomiting   Jardiance  [Empagliflozin ]     Yeast infections, fatigue   Penicillins Rash and Other (See Comments)   Patient Measurements: Height: 5' 8 (172.7 cm) Weight: 113.4 kg (250 lb) IBW/kg (Calculated) : 63.9 HEPARIN  DW (KG): 95.1  Vital Signs: Temp: 97.5 F (36.4 C) (10/10 0533) Temp Source: Oral (10/10 0533) BP: 125/81 (10/10 0533) Pulse Rate: 77 (10/10 0533)  Labs: Recent Labs     0000 11/23/23 1040 11/24/23 0150 11/24/23 1345 11/25/23 0554 11/25/23 0555 11/25/23 1911 11/25/23 2105 11/26/23 0536 11/26/23 0537  HGB   < >  --  10.3*  --  9.8*  --   --   --  10.2*  --   HCT  --   --  32.6*  --  31.2*  --   --   --  31.9*  --   PLT  --   --  151  --  133*  --   --   --  125*  --   APTT  --  123*  --   --   --   --   --   --   --   --   HEPARINUNFRC  --   --  0.42   < >  --    < > 0.78* 0.48  --  0.76*  CREATININE  --   --  1.93*  --  1.95*  --   --   --   --   --    < > = values in this interval not displayed.   Estimated Creatinine Clearance: 34 mL/min (A) (by C-G formula based on SCr of 1.95 mg/dL (H)).  Assessment: 73 y.o. female with history of bilateral DVTs in 2017 taking Eliquis  PTA (last dose 10/1 AM). Pharmacy consulted to dose heparin  pending cath.  Heparin  level abive goal on 700 units/hr Plans for cath on 10/10  Goal of Therapy:  Heparin  level 0.3-0.7 units/ml aPTT 66-102 seconds Monitor platelets by anticoagulation protocol: Yes   Plan:  -Decrease heparin  to 850 units/hr -Daily heparin  level and CBC -Will follow after cath   Prentice Poisson, PharmD Clinical Pharmacist **Pharmacist phone directory can now be found on [email or web address] ([email or web address]).  Listed under [facility].

## 2023-11-26 NOTE — Progress Notes (Signed)
 Physical Therapy Treatment Patient Details Name: Stephanie [name] MRN: [id number] DOB: 1950-12-31 Today's Date: 11/26/2023   History of Present Illness 73 y.o. female who presented 9/30 with lower extremity swelling, SOB, and weight gain. Dx acute on chronic systolic heart failure. PMH includes: systolic heart failure, hypothyroidism, hyperlipidemia, COPD/asthma, history of DVT, fibromyalgia, GERD and type 2 diabetes mellitus with nephropathy.    PT Comments  The pt was agreeable to session, able to demo improved ability to assist with bed mobility and decreased assist needed for sit-stand transfers. The pt continues to need cues for hand positioning, but completed sit-stand x3 in session with modA due to poor power in LE and limited anterior wt shift. Pt with improved ability to manage RW and improved endurance this session as she completed increased transfers and increased distance of ambulation in the room. Ambulation limited by lack of assistance for chair follow for safe gait progression at this time. Recommendations remain appropriate, will continue efforts acutely.     If plan is discharge home, recommend the following: A lot of help with bathing/dressing/bathroom;Assistance with cooking/housework;Assist for transportation;Help with stairs or ramp for entrance;Two people to help with walking and/or transfers   Can travel by private vehicle     No  Equipment Recommendations  [place] lift;Hospital bed    Recommendations for Other Services       Precautions / Restrictions Precautions Precautions: Fall Recall of Precautions/Restrictions: Intact Precaution/Restrictions Comments: significant edema Restrictions Weight Bearing Restrictions Per Provider Order: No     Mobility  Bed Mobility Overal bed mobility: Needs Assistance Bed Mobility: Supine to Sit     Supine to sit: Mod assist, HOB elevated     General bed mobility comments: pt able to move LE to EOB without assist, modA to  elevate trunk    Transfers Overall transfer level: Needs assistance Equipment used: Rolling walker (2 wheels) Transfers: Sit to/from Stand, Bed to chair/wheelchair/BSC Sit to Stand: Mod assist, From elevated surface   Step pivot transfers: Min assist       General transfer comment: +2 helpful for safety, but completed this session with modA to rise once cues given for hand positioning, and minA to steady while pivoting. pt completed sit-stand x3 and pivot x2 in session    Ambulation/Gait Ambulation/Gait assistance: Min assist, +2 safety/equipment Gait Distance (Feet): 5 Feet (+ 39ft) Assistive device: Rolling walker (2 wheels) Gait Pattern/deviations: Decreased stride length, Step-to pattern, Trunk flexed, Shuffle Gait velocity: decreased Gait velocity interpretation: <1.31 ft/sec, indicative of household ambulator   General Gait Details: pt with small steps, minimal clearance but able to manage RW without assist today. no overt LOB. VSS on 3L. unable to progress gait due to lack of assist   Stairs             Wheelchair Mobility     Tilt Bed    Modified Rankin (Stroke Patients Only)       Balance Overall balance assessment: Needs assistance Sitting-balance support: Feet supported, Bilateral upper extremity supported Sitting balance-Leahy Scale: Poor Sitting balance - Comments: able to maintain without UE support, not challenged dynamically   Standing balance support: Bilateral upper extremity supported Standing balance-Leahy Scale: Poor Standing balance comment: dependent on BUE support, no overt buckling or LOB                            Communication Communication Communication: No apparent difficulties  Cognition Arousal: Alert Behavior During Therapy: [facility]  for tasks assessed/performed   PT - Cognitive impairments: No apparent impairments                       PT - Cognition Comments: WFL for session, not formally  assessed Following commands: Intact      Cueing Cueing Techniques: Verbal cues, Gestural cues  Exercises      General Comments General comments (skin integrity, edema, etc.): VSS on 3L, pt cleaned and bed linens changed      Pertinent Vitals/Pain Pain Assessment Pain Assessment: Faces Faces Pain Scale: Hurts little more Pain Location: generalized discomfort with ROM Pain Descriptors / Indicators: Aching, Discomfort, Grimacing, Spasm Pain Intervention(s): Limited activity within patient's tolerance, Monitored during session, Repositioned     PT Goals (current goals can now be found in the care plan section) Acute Rehab PT Goals Patient Stated Goal: Get well PT Goal Formulation: With patient Time For Goal Achievement: 12/02/23 Potential to Achieve Goals: Good Progress towards PT goals: Progressing toward goals    Frequency    Min 2X/week       AM-PAC PT 6 Clicks Mobility   Outcome Measure  Help needed turning from your back to your side while in a flat bed without using bedrails?: A Little Help needed moving from lying on your back to sitting on the side of a flat bed without using bedrails?: A Lot Help needed moving to and from a bed to a chair (including a wheelchair)?: A Lot Help needed standing up from a chair using your arms (e.g., wheelchair or bedside chair)?: A Lot Help needed to walk in hospital room?: Total (<20 ft) Help needed climbing 3-5 steps with a railing? : Total 6 Click Score: 11    End of Session Equipment Utilized During Treatment: Oxygen ;Gait belt Activity Tolerance: Patient tolerated treatment well;Patient limited by pain Patient left: with call bell/phone within reach;in chair;with chair alarm set Nurse Communication: Mobility status PT Visit Diagnosis: Unsteadiness on feet (R26.81);Other abnormalities of gait and mobility (R26.89);Muscle weakness (generalized) (M62.81);Difficulty in walking, not elsewhere classified (R26.2)     Time:  8568-8484 PT Time Calculation (min) (ACUTE ONLY): 44 min  Charges:    $Therapeutic Exercise: 23-37 mins $Therapeutic Activity: 8-22 mins PT General Charges $$ ACUTE PT VISIT: 1 Visit                     Izetta Call, PT, DPT   Acute Rehabilitation Department Office [phone] Secure Chat Communication Preferred   Izetta JULIANNA Call 11/26/2023, 3:29 PM

## 2023-11-26 NOTE — Plan of Care (Signed)

## 2023-11-26 NOTE — Progress Notes (Signed)
 Patient c/o lowe abdominal pain radiating to her lower back. Patient received a bladder scan and order in for x ray per Segars MD

## 2023-11-27 DIAGNOSIS — I5023 Acute on chronic systolic (congestive) heart failure: Secondary | ICD-10-CM | POA: Diagnosis not present

## 2023-11-27 DIAGNOSIS — I5020 Unspecified systolic (congestive) heart failure: Secondary | ICD-10-CM

## 2023-11-27 DIAGNOSIS — R197 Diarrhea, unspecified: Secondary | ICD-10-CM | POA: Diagnosis not present

## 2023-11-27 LAB — CBC
HCT: 31 % — ABNORMAL LOW (ref 36.0–46.0)
Hemoglobin: 9.9 g/dL — ABNORMAL LOW (ref 12.0–15.0)
MCH: 27.3 pg (ref 26.0–34.0)
MCHC: 31.9 g/dL (ref 30.0–36.0)
MCV: 85.4 fL (ref 80.0–100.0)
Platelets: 114 K/uL — ABNORMAL LOW (ref 150–400)
RBC: 3.63 MIL/uL — ABNORMAL LOW (ref 3.87–5.11)
RDW: 15 % (ref 11.5–15.5)
WBC: 8.7 K/uL (ref 4.0–10.5)
nRBC: 0 % (ref 0.0–0.2)

## 2023-11-27 LAB — GLUCOSE, CAPILLARY
Glucose-Capillary: 119 mg/dL — ABNORMAL HIGH (ref 70–99)
Glucose-Capillary: 131 mg/dL — ABNORMAL HIGH (ref 70–99)
Glucose-Capillary: 113 mg/dL — ABNORMAL HIGH (ref 70–99)
Glucose-Capillary: 130 mg/dL — ABNORMAL HIGH (ref 70–99)

## 2023-11-27 LAB — COOXEMETRY PANEL
Carboxyhemoglobin: 2.5 % — ABNORMAL HIGH (ref 0.5–1.5)
Methemoglobin: <0.7 % (ref 0.0–1.5)
O2 Saturation: 70.5 %
Total hemoglobin: 9.8 g/dL — ABNORMAL LOW (ref 12.0–16.0)

## 2023-11-27 MED ORDER — MILRINONE LACTATE IN DEXTROSE 20-5 MG/100ML-% IV SOLN
0.1250 ug/kg/min | INTRAVENOUS | Status: DC
  Administered 2023-11-27 – 2023-12-02 (×11): 0.25 ug/kg/min via INTRAVENOUS
  Administered 2023-12-02: 0.125 ug/kg/min via INTRAVENOUS
  Filled 2023-11-27 (×12): qty 100

## 2023-11-27 MED ORDER — BISOPROLOL FUMARATE 5 MG PO TABS
5.0000 mg | ORAL_TABLET | Freq: Every day | ORAL | Status: DC
Start: 2023-11-28 — End: 2023-11-29
  Administered 2023-11-28 – 2023-11-29 (×2): 5 mg via ORAL
  Filled 2023-11-27 (×2): qty 1

## 2023-11-27 NOTE — Progress Notes (Addendum)
 Progress Note  Patient Name: Stephanie [name] Date of Encounter: 11/27/2023  Primary Cardiologist: Jayson Sierras, MD  Interval Summary  Chart reviewed.  Right heart catheterization noted yesterday.  She continues to diurese on high-dose Lasix , still not at baseline in terms of weight and fluid status.  Treatment options appear limited, however do not preclude a temporary course of milrinone to assist with diuresis particularly in the setting of RV failure and anasarca.  Agree that palliative care consultation is also reasonable.  Has PICC line in place.  Vital Signs  Vitals:   11/27/23 0100 11/27/23 0451 11/27/23 0741 11/27/23 1204  BP: 131/72 101/82 128/82 90/73  Pulse: 77 74 73 82  Resp: 19 16 12  (!) 30  Temp: 97.6 F (36.4 C) 97.8 F (36.6 C) 97.6 F (36.4 C) 97.6 F (36.4 C)  TempSrc: Oral Axillary Axillary Oral  SpO2: 99% 100% 100% 100%  Weight:  112.2 kg    Height:        Intake/Output Summary (Last 24 hours) at 11/27/2023 1245 Last data filed at 11/27/2023 0457 Gross per 24 hour  Intake 402 ml  Output 1100 ml  Net -698 ml   Filed Weights   11/25/23 0420 11/26/23 0732 11/27/23 0451  Weight: 113.4 kg 114.8 kg 112.2 kg    Physical Exam  GEN: No acute distress.   Neck: Difficult to assess JVP. Cardiac: RRR, no gallop.  Respiratory: Nonlabored. Clear to auscultation bilaterally. GI: Protuberant, nontender, bowel sounds present. MS: Slowly improving edema.  ECG/Telemetry  Telemetry reviewed showing sinus rhythm with PACs.  Labs  Chemistry Recent Labs  Lab 11/22/23 9347 11/23/23 0500 11/24/23 0150 11/25/23 0554 11/26/23 0947  NA 131*   < > 132* 133* 133*  K 3.6   < > 3.8 3.8 3.9  CL 96*   < > 96* 97* 97*  CO2 26   < > 25 25 24   GLUCOSE 89   < > 111* 94 122*  BUN 29*   < > 30* 30* 28*  CREATININE 1.85*   < > 1.93* 1.95* 1.90*  CALCIUM  8.3*   < > 8.5* 8.3* 8.4*  PROT 6.9  --  7.2  --   --   ALBUMIN 2.0*  --  2.2*  --   --   AST 28  --  32  --    --   ALT 19  --  20  --   --   ALKPHOS 174*  --  184*  --   --   BILITOT 2.0*  --  2.3*  --   --   GFRNONAA 28*   < > 27* 27* 28*  ANIONGAP 9   < > 11 11 12    < > = values in this interval not displayed.    Hematology Recent Labs  Lab 11/25/23 0554 11/26/23 0536 11/27/23 0500  WBC 8.5 9.9 8.7  RBC 3.68* 3.76* 3.63*  HGB 9.8* 10.2* 9.9*  HCT 31.2* 31.9* 31.0*  MCV 84.8 84.8 85.4  MCH 26.6 27.1 27.3  MCHC 31.4 32.0 31.9  RDW 15.3 15.3 15.0  PLT 133* 125* 114*   Cardiac EnzymesNo results for input(s): TROPONINIHS in the last 720 hours. Lipid Panel     Component Value Date/Time   CHOL 113 11/18/2023 1745   CHOL 186 03/16/2023 1015   TRIG 42 11/18/2023 1745   HDL 41 11/18/2023 1745   HDL 60 03/16/2023 1015   CHOLHDL 2.8 11/18/2023 1745   VLDL 8 11/18/2023  1745   LDLCALC 64 11/18/2023 1745   LDLCALC 109 (H) 03/16/2023 1015   LABVLDL 17 03/16/2023 1015    Cardiac Studies  Echocardiogram 11/17/2023:  1. Left ventricular ejection fraction, by estimation, is 35 to 40%. Left  ventricular ejection fraction by 3D volume is 33 %. The left ventricle has  moderately decreased function. The left ventricle demonstrates regional  wall motion abnormalities (see  scoring diagram/findings for description). The left ventricular internal  cavity size was mildly dilated. Left ventricular diastolic parameters are  indeterminate.   2. Right ventricular systolic function is mildly reduced. The right  ventricular size is severely enlarged. Moderately increased right  ventricular wall thickness. There is normal pulmonary artery systolic  pressure. The estimated right ventricular  systolic pressure is 31.8 mmHg.   3. Large pleural effusion.   4. The mitral valve is normal in structure. Mild mitral valve  regurgitation. No evidence of mitral stenosis.   5. The aortic valve is tricuspid. Aortic valve regurgitation is not  visualized. No aortic stenosis is present.   6. The inferior vena  cava is dilated in size with >50% respiratory  variability, suggesting right atrial pressure of 8 mmHg.   Right heart catheterization 11/26/2023: Findings:   RA =  27 RV = 52/26 PA =  48/26 (36) PCW = 22 Fick cardiac output/index = 6.3/2.8 Thermo CO/CI = 4.0/1.8 PVR = 2.2 (Fick) 3.5 (TD) Ao sat = 97% PA sat = 62%, 64% PAPi = 0.81   Assessment: 1. 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 Can consider inpatient trial of milrinone and IV diuresis to optimize but would strongly consider Palliative Care involvement.  Assessment & Plan  1.  HFrEF with biventricular heart failure, LVEF 35 to 40%.  Right heart catheterization indicated low output with Thermo CI 1.8, PCW 22, and mean PA 36.  Currently on Lasix  120 mg IV 3 times a day, net urine output approximately 600 cc last 24 hours with some output unmeasured.  Weight continues to decrease.  2.  History of recurrent lower extremity DVT, on Eliquis  5 mg twice daily.  3.  Primary hypertension.  4.  CKD stage IIIb with AKI in the setting of diuresis.  5.  Coronary artery calcification evident by CT imaging.  No history of obstructive CAD, holding off on cardiac catheterization at this time in light of renal dysfunction.  No chest pain.  Continues on Lipitor  80 mg daily.  6.  Hypoalbuminemia, also contributing to fluid retention.  Recommend trial of temporary low-dose milrinone to assist with diuresis particularly in the setting of RV failure.  Would reduce bisoprolol  to 5 mg as well.  Continue to hold Entresto .  Not candidate for SGLT2 inhibitors with prior history of yeast infections.  Hold off on MRA.  Agree with palliative care consultation to better understand overall GOC from patient perspective particularly in light of limited treatment options..  Once fluid status optimized can settle in on basic GDMT  as tolerated.  For questions or updates, please contact [facility] Please consult [email or web address] for contact info under   Signed, Jayson Sierras, MD  11/27/2023, 12:45 PM

## 2023-11-27 NOTE — Progress Notes (Signed)
 [facility]  PROGRESS NOTE  Stephanie [name] FMW:[id number] DOB: 30-Dec-1950 DOA: 11/16/2023 PCP: Roni, [facility]   Brief HPI:   73 y.o. female with medical history significant of systolic heart failure, hypothyroidism, hyperlipidemia, COPD/asthma, history of DVT, fibromyalgia, GERD and type 2 diabetes mellitus with nephropathy; who presented to the hospital secondary to increased lower extremity swelling, shortness of breath with activity/orthopnea and weight gain.  Patient reports symptom has been present for the last 3 to 4 days and worsening; she was scheduled to be seen by cardiology service as an outpatient but unfortunately due to worsening of her symptoms did not make it and presented to the emergency department instead.     Of note, patient reports to be compliant with her medications.   ED:  Demonstrating elevated BNP, signs of fluid overload on physical exam and a chest x-ray with vascular congestion/interstitial edema.  IV Lasix  was started      Assessment/Plan:   Acute on chronic systolic heart failure - Hemodynamically stable, satting 100% on 1 L of oxygen  -Repeat 2d echo with EF 35-40% with regional WMA, mildly reduced RV function and severely enlarged RV size  - h/o EJF around 40% on previous echo. - Presenting with complaint of weight gain, orthopnea, lower extremity swelling and elevated BNP -presenting BNP 14,798.0 - Cardiology following, Lasix  dose increased to 120 mg 3 times daily - Continue  Bisoprolol , Entresto  stopped due to worsening creatinine -continued on heparin  gtt. plan was to get LHC in the hospital however, currently on hold due to worsening CHF and renal insufficiency -Plan for outpatient cardiac cath as per Dr. Ladona - Underwent right heart cath, patient has end-stage biventricular failure.  Recommends palliative consult due to poor functional status and comorbidities.  No milrinone recommended. - Recommended palliative care consult. - Will  consult palliative care for further clarification of goals of care  Diarrhea -Resolved -Patient had full episodes of loose stools yesterday -No loose stools this morning   COPD/asthma - Appears to be stable and currently no signs of acute exacerbation - Continue bronchodilator management.   Type 2 diabetes with nephropathy - Update A1c - Checking CBG q. ACHS, SSI coverage -CBG well-controlled   Acute on Chronic kidney disease stage IIIb  - With a slightly increase in patient's creatinine from recent values most likely in the setting of decreased perfusion with CHF exacerbation -Cr higher today to 1.9. On IV lasix  per Cardiology Entresto  stopped due to worsening creatinine - Follow renal function in a.m.   Hypertension - Stable on Lasix  drip, Entresto  - Continue current antihypertensive agent -Holding home medication spironolactone   GERD - Continue PPI   Hypothyroidism - TSH: 6.73 -Checking free T3, T4 - Continue Synthroid .   Depression/anxiety/neuropathy and Fibromyalgia - Continue Cymbalta  and Atarax  - Continue Lyrica  and Robaxin  - Complained of increased pain this AM. Added PRN IV dilaudid  for breakthrough    History of DVT and prior CVA - No acute neurologic deficit - Heparin  discontinued.  Started on apixaban    Hyperlipidemia - Resume outpatient Repatha  after discharge   Constipation -Pt reports good results recently with cathartics    Medications     apixaban   5 mg Oral BID   arformoterol  15 mcg Nebulization BID   And   umeclidinium bromide  1 puff Inhalation Daily   vitamin C   500 mg Oral Daily   atorvastatin   80 mg Oral Daily   [START ON 11/28/2023] bisoprolol   5 mg Oral Daily  Chlorhexidine  Gluconate Cloth  6 each Topical Daily   cycloSPORINE  1 drop Both Eyes BID   DULoxetine   60 mg Oral BID   insulin  aspart  0-15 Units Subcutaneous TID WC   insulin  aspart  0-5 Units Subcutaneous QHS   levothyroxine   100 mcg Oral QAC breakfast    loratadine   10 mg Oral Daily   pantoprazole   40 mg Oral Daily   potassium chloride   40 mEq Oral Daily   pregabalin   75 mg Oral BID   sodium chloride  flush  10-40 mL Intracatheter Q12H   sodium chloride  flush  3 mL Intravenous Q12H   traZODone   150 mg Oral QHS   ursodiol  300 mg Oral BID     Data Reviewed:   CBG:  Recent Labs  Lab 11/26/23 1200 11/26/23 1623 11/26/23 2127 11/27/23 0737 11/27/23 1202  GLUCAP 127* 125* 132* 119* 131*    SpO2: 99 % O2 Flow Rate (L/min): 2 L/min FiO2 (%): 28 %    Vitals:   11/27/23 1300 11/27/23 1400 11/27/23 1425 11/27/23 1454  BP:   136/70 (!) 109/94  Pulse:      Resp: 14 13 14 16   Temp:      TempSrc:      SpO2:    99%  Weight:      Height:          Data Reviewed:  Basic Metabolic Panel: Recent Labs  Lab 11/21/23 1442 11/22/23 0652 11/23/23 0500 11/24/23 0150 11/25/23 0554 11/26/23 0947  NA  --  131* 133* 132* 133* 133*  K  --  3.6 3.9 3.8 3.8 3.9  CL  --  96* 96* 96* 97* 97*  CO2  --  26 25 25 25 24   GLUCOSE  --  89 106* 111* 94 122*  BUN  --  29* 30* 30* 30* 28*  CREATININE  --  1.85* 1.94* 1.93* 1.95* 1.90*  CALCIUM   --  8.3* 8.6* 8.5* 8.3* 8.4*  MG 1.7 2.0  --  2.1  --   --     CBC: Recent Labs  Lab 11/23/23 0500 11/24/23 0150 11/25/23 0554 11/26/23 0536 11/27/23 0500  WBC 8.3 9.8 8.5 9.9 8.7  HGB 10.9* 10.3* 9.8* 10.2* 9.9*  HCT 34.6* 32.6* 31.2* 31.9* 31.0*  MCV 84.2 84.2 84.8 84.8 85.4  PLT 163 151 133* 125* 114*    LFT Recent Labs  Lab 11/22/23 0652 11/24/23 0150  AST 28 32  ALT 19 20  ALKPHOS 174* 184*  BILITOT 2.0* 2.3*  PROT 6.9 7.2  ALBUMIN 2.0* 2.2*     Antibiotics: Anti-infectives (From admission, onward)    None        DVT prophylaxis: Heparin   Code Status: Full code  Family Communication: No family at bedside   CONSULTS cardiology   Subjective   Denies chest pain or shortness of breath  Objective    Physical Examination:  Appears in no acute  distress S1-S2, regular Lungs clear to auscultation bilaterally Extremities 1+ edema in the lower extremities   Status is: Inpatient:             Sabas Stephanie [name]   [facility] If 7PM-7AM, please contact night-coverage at [email or web address], Office  [phone]   11/27/2023, 3:37 PM  LOS: 11 days

## 2023-11-27 NOTE — TOC Progression Note (Signed)
 Transition of Care [facility]) - Progression Note    Patient Details  Name: Stephanie [name] MRN: [id number] Date of Birth: 1950/06/01  [facility] CM/SW Contact  Isaiah Public, LCSWA Phone Number: 11/27/2023, 3:10 PM  Clinical Narrative:     This patient has SNF bed at [facility]. CSW following to start insurance authorization for patient closer to patient being medically ready for dc. CSW will continue to follow.  Expected Discharge Plan:  (TBD) Barriers to Discharge: Continued Medical Work up               Expected Discharge Plan and Services In-house Referral: Clinical Social Work     Living arrangements for the past 2 months: Single Family Home                                       Social Drivers of Health (SDOH) Interventions SDOH Screenings   Food Insecurity: No Food Insecurity (11/17/2023)  Housing: Low Risk  (11/17/2023)  Transportation Needs: No Transportation Needs (11/17/2023)  Utilities: Not At Risk (11/17/2023)  Depression (PHQ2-9): Low Risk  (09/14/2023)  Social Connections: Moderately Isolated (11/17/2023)  Tobacco Use: Medium Risk (11/16/2023)    Readmission Risk Interventions     No data to display

## 2023-11-27 NOTE — Plan of Care (Signed)
   Problem: Activity: Goal: Risk for activity intolerance will decrease Outcome: Progressing   Problem: Nutrition: Goal: Adequate nutrition will be maintained Outcome: Progressing

## 2023-11-28 ENCOUNTER — Encounter (HOSPITAL_COMMUNITY): Payer: Self-pay | Admitting: Internal Medicine

## 2023-11-28 DIAGNOSIS — Z7189 Other specified counseling: Secondary | ICD-10-CM | POA: Diagnosis not present

## 2023-11-28 DIAGNOSIS — Z711 Person with feared health complaint in whom no diagnosis is made: Secondary | ICD-10-CM

## 2023-11-28 DIAGNOSIS — R197 Diarrhea, unspecified: Secondary | ICD-10-CM | POA: Diagnosis not present

## 2023-11-28 DIAGNOSIS — I5020 Unspecified systolic (congestive) heart failure: Secondary | ICD-10-CM | POA: Diagnosis not present

## 2023-11-28 DIAGNOSIS — Z515 Encounter for palliative care: Secondary | ICD-10-CM | POA: Diagnosis not present

## 2023-11-28 DIAGNOSIS — I5023 Acute on chronic systolic (congestive) heart failure: Secondary | ICD-10-CM | POA: Diagnosis not present

## 2023-11-28 LAB — BASIC METABOLIC PANEL WITH GFR
Anion gap: 13 (ref 5–15)
BUN: 29 mg/dL — ABNORMAL HIGH (ref 8–23)
CO2: 25 mmol/L (ref 22–32)
Calcium: 8.1 mg/dL — ABNORMAL LOW (ref 8.9–10.3)
Chloride: 99 mmol/L (ref 98–111)
Creatinine, Ser: 1.73 mg/dL — ABNORMAL HIGH (ref 0.44–1.00)
GFR, Estimated: 31 mL/min — ABNORMAL LOW (ref >60)
Glucose, Bld: 152 mg/dL — ABNORMAL HIGH (ref 70–99)
Potassium: 3.8 mmol/L (ref 3.5–5.1)
Sodium: 137 mmol/L (ref 135–145)

## 2023-11-28 LAB — GLUCOSE, CAPILLARY
Glucose-Capillary: 141 mg/dL — ABNORMAL HIGH (ref 70–99)
Glucose-Capillary: 143 mg/dL — ABNORMAL HIGH (ref 70–99)
Glucose-Capillary: 136 mg/dL — ABNORMAL HIGH (ref 70–99)
Glucose-Capillary: 129 mg/dL — ABNORMAL HIGH (ref 70–99)

## 2023-11-28 LAB — COOXEMETRY PANEL
Carboxyhemoglobin: 2.6 % — ABNORMAL HIGH (ref 0.5–1.5)
Carboxyhemoglobin: 2.2 % — ABNORMAL HIGH (ref 0.5–1.5)
Methemoglobin: <0.7 % (ref 0.0–1.5)
Methemoglobin: <0.7 % (ref 0.0–1.5)
O2 Saturation: 75.2 %
O2 Saturation: 98.2 %
Total hemoglobin: 9.5 g/dL — ABNORMAL LOW (ref 12.0–16.0)
Total hemoglobin: 9.6 g/dL — ABNORMAL LOW (ref 12.0–16.0)

## 2023-11-28 LAB — MAGNESIUM: Magnesium: 1.8 mg/dL (ref 1.7–2.4)

## 2023-11-28 NOTE — Progress Notes (Signed)
 Progress Note  Patient Name: Stephanie [name] Date of Encounter: 11/28/2023  Primary Cardiologist: Jayson Sierras, MD  Interval Summary  Chart reviewed.  States that she feels better today.  No shortness of breath at rest.  Co-ox 75 and net urine output of approximately 900 cc last 24 hours.  Creatinine down to 1.73.  Tolerating milrinone.  Vital Signs  Vitals:   11/27/23 2337 11/28/23 0439 11/28/23 0745 11/28/23 0851  BP: 101/67 106/61 (!) 138/92   Pulse:   89 86  Resp: 18  16 14   Temp: 97.8 F (36.6 C) (!) 97.5 F (36.4 C)    TempSrc: Oral Oral    SpO2: 99%  99% 100%  Weight:  111.8 kg    Height:        Intake/Output Summary (Last 24 hours) at 11/28/2023 1045 Last data filed at 11/28/2023 0445 Gross per 24 hour  Intake 729.82 ml  Output 1800 ml  Net -1070.18 ml   Filed Weights   11/26/23 0732 11/27/23 0451 11/28/23 0439  Weight: 114.8 kg 112.2 kg 111.8 kg    Physical Exam  GEN: No acute distress.   Neck: Difficult to assess JVP. Cardiac: RRR, no gallop.  Respiratory: Nonlabored. Clear to auscultation bilaterally. GI: Protuberant, nontender, bowel sounds present. MS: Slowly improving edema.  PICC line in place right upper extremity.  ECG/Telemetry  Telemetry reviewed showing sinus rhythm with PACs.  Labs  Chemistry Recent Labs  Lab 11/22/23 [id number] 11/23/23 0500 11/24/23 0150 11/25/23 0554 11/26/23 0947 11/28/23 0600  NA 131*   < > 132* 133* 133* 137  K 3.6   < > 3.8 3.8 3.9 3.8  CL 96*   < > 96* 97* 97* 99  CO2 26   < > 25 25 24 25   GLUCOSE 89   < > 111* 94 122* 152*  BUN 29*   < > 30* 30* 28* 29*  CREATININE 1.85*   < > 1.93* 1.95* 1.90* 1.73*  CALCIUM  8.3*   < > 8.5* 8.3* 8.4* 8.1*  PROT 6.9  --  7.2  --   --   --   ALBUMIN 2.0*  --  2.2*  --   --   --   AST 28  --  32  --   --   --   ALT 19  --  20  --   --   --   ALKPHOS 174*  --  184*  --   --   --   BILITOT 2.0*  --  2.3*  --   --   --   GFRNONAA 28*   < > 27* 27* 28* 31*  ANIONGAP 9    < > 11 11 12 13    < > = values in this interval not displayed.    Hematology Recent Labs  Lab 11/25/23 0554 11/26/23 0536 11/27/23 0500  WBC 8.5 9.9 8.7  RBC 3.68* 3.76* 3.63*  HGB 9.8* 10.2* 9.9*  HCT 31.2* 31.9* 31.0*  MCV 84.8 84.8 85.4  MCH 26.6 27.1 27.3  MCHC 31.4 32.0 31.9  RDW 15.3 15.3 15.0  PLT 133* 125* 114*   Cardiac EnzymesNo results for input(s): TROPONINIHS in the last 720 hours. Lipid Panel     Component Value Date/Time   CHOL 113 11/18/2023 1745   CHOL 186 03/16/2023 1015   TRIG 42 11/18/2023 1745   HDL 41 11/18/2023 1745   HDL 60 03/16/2023 1015   CHOLHDL 2.8 11/18/2023 1745   VLDL  8 11/18/2023 1745   LDLCALC 64 11/18/2023 1745   LDLCALC 109 (H) 03/16/2023 1015   LABVLDL 17 03/16/2023 1015    Cardiac Studies  Echocardiogram 11/17/2023:  1. Left ventricular ejection fraction, by estimation, is 35 to 40%. Left  ventricular ejection fraction by 3D volume is 33 %. The left ventricle has  moderately decreased function. The left ventricle demonstrates regional  wall motion abnormalities (see  scoring diagram/findings for description). The left ventricular internal  cavity size was mildly dilated. Left ventricular diastolic parameters are  indeterminate.   2. Right ventricular systolic function is mildly reduced. The right  ventricular size is severely enlarged. Moderately increased right  ventricular wall thickness. There is normal pulmonary artery systolic  pressure. The estimated right ventricular  systolic pressure is 31.8 mmHg.   3. Large pleural effusion.   4. The mitral valve is normal in structure. Mild mitral valve  regurgitation. No evidence of mitral stenosis.   5. The aortic valve is tricuspid. Aortic valve regurgitation is not  visualized. No aortic stenosis is present.   6. The inferior vena cava is dilated in size with >50% respiratory  variability, suggesting right atrial pressure of 8 mmHg.   Right heart catheterization  11/26/2023: Findings:   RA =  27 RV = 52/26 PA =  48/26 (36) PCW = 22 Fick cardiac output/index = 6.3/2.8 Thermo CO/CI = 4.0/1.8 PVR = 2.2 (Fick) 3.5 (TD) Ao sat = 97% PA sat = 62%, 64% PAPi = 0.81   Assessment: 1. 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 Can consider inpatient trial of milrinone and IV diuresis to optimize but would strongly consider Palliative Care involvement.  Assessment & Plan  1.  HFrEF with biventricular heart failure, LVEF 35 to 40%.  Right heart catheterization indicated low output with Thermo CI 1.8, PCW 22, and mean PA 36.  Currently on Lasix  120 mg IV 3 times a day, bisoprolol  dose cut in half yesterday and also started on low-dose milrinone.  Tolerating well with approximately 900 cc net urine output and creatinine down to 1.73.  2.  History of recurrent lower extremity DVT, on Eliquis  5 mg twice daily.  3.  Primary hypertension.  4.  CKD stage IIIb with AKI in the setting of diuresis.  Creatinine down from 1.9-1.73  5.  Coronary artery calcification evident by CT imaging.  No history of obstructive CAD, holding off on cardiac catheterization at this time in light of renal dysfunction.  No chest pain.  Continues on Lipitor  80 mg daily.  6.  Hypoalbuminemia, also contributing to fluid retention.  Continue milrinone for now, no change in present diuretics.  Continue to hold Entresto .  Not candidate for SGLT2 inhibitors with prior history of yeast infections.  Hold off on MRA.  Realize that milrinone is temporizing, hopefully will help get fluid status more optimized and then transition back to oral regimen.  If renal function continues to improve, may even be able to pursue coronary angiography.  Will ask heart failure team to see her tomorrow and help guide management.  Unlikely to be a good candidate for advanced therapies  however in light of severe RV dysfunction.  She did talk with palliative care yesterday about GOC.  For questions or updates, please contact [facility] Please consult [email or web address] for contact info under   Signed, Jayson Sierras, MD  11/28/2023, 10:45 AM

## 2023-11-28 NOTE — Plan of Care (Signed)
  Problem: Metabolic: Goal: Ability to maintain appropriate glucose levels will improve Outcome: Progressing   Problem: Nutritional: Goal: Maintenance of adequate nutrition will improve Outcome: Progressing Goal: Progress toward achieving an optimal weight will improve Outcome: Progressing   Problem: Skin Integrity: Goal: Risk for impaired skin integrity will decrease Outcome: Progressing   Problem: Tissue Perfusion: Goal: Adequacy of tissue perfusion will improve Outcome: Progressing

## 2023-11-28 NOTE — Consult Note (Signed)
 Palliative Medicine Inpatient Consult Note  Consulting Provider: Drusilla Sabas RAMAN, MD   Reason for consult:   Palliative Care Consult Services Palliative Medicine Consult  Reason for Consult? Goals of care   11/28/2023  HPI:  Per intake H&P -->  73 y.o. female with medical history significant of systolic heart failure, hypothyroidism, hyperlipidemia, COPD/asthma, history of DVT, fibromyalgia, GERD and type 2 diabetes mellitus with nephropathy; who presented to the hospital secondary to increased lower extremity swelling, shortness of breath with activity/orthopnea and weight gain.  Palliative care has been asked to support additional goals of care conversations in the setting of heart failure - NYHA Class IV.  Clinical Assessment/Goals of Care:  *Please note that this is a verbal dictation therefore any spelling or grammatical errors are due to the Dragon Medical One system interpretation.  I have reviewed medical records including EPIC notes, labs and imaging, received report from bedside RN, assessed the patient who is sitting up in bed in no acute distress.    I met with Stephanie [name] [name] to further discuss diagnosis prognosis, GOC, EOL wishes, disposition and options.   I introduced Palliative Medicine as specialized medical care for people living with serious illness. It focuses on providing relief from the symptoms and stress of a serious illness. The goal is to improve quality of life for both the patient and the family.  Medical History Review and Understanding:  A review of Stephanie [name] [name]'s past medical history significant for COPD, deep vein thrombosis, fibromyalgia, GERD, type 2 diabetes, systolic heart failure, hypothyroidism, multiple prior CVAs, and hyperlipidemia was completed.  Social History:  Stephanie [name] [name] is from [place]  .  She shares that she was married though since divorced.  She has 2 daughters, 4 grandchildren, and 1 great grandchild.  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  She shares with me the strong faith that she has and how she feels God did not bring me here just to leave me.  Functional and Nutritional State:  Preceding hospitalization Stephanie [name] [name] lived with her daughter Stephanie [name].  She was able to help to bathe herself and dress herself though her daughter did wash her back.  She was able to feed herself.  She has not noted any significant deviations in her appetite.  Advance Directives:  A detailed discussion was had today regarding advanced directives.  Stephanie [name] [name] has never completed advanced directives.  We reviewed the importance of doing these documents as it would family moving in the future knowing who the primary decision-maker should be as well as what Stephanie [name] [name]'s wishes are and circumstances whereby improvement neglects to be seen.  Code Status:  Concepts specific to code status, artifical feeding and hydration, continued IV antibiotics and rehospitalization was had.  The difference between a aggressive medical intervention path  and a palliative comfort care path for this patient at this time was had.   Encouraged patient/family to consider DNR/DNI status understanding evidenced based poor outcomes in similar hospitalized patient, as the cause of arrest is likely associated with advanced chronic/terminal illness rather than an easily reversible acute cardio-pulmonary event. I explained that DNR/DNI does not change the medical plan and it only comes into effect after a person has arrested (died).  It is a protective measure to keep us  from harming the patient in their last moments of life.   Stephanie [name] [name] notes familiarity with cardiopulmonary resuscitation.  She would want  resuscitative efforts made though she would not want to maintain on long-term life support if her outcomes were not going to be meaningful.  I was able to share with her again the importance of advanced  care  Discussion:  We reviewed the circumstances surrounding Stephanie [name] [name]'s acute hospitalization inclusive of her shortness of breath and increased swelling in her lower extremities. We discussed the severity of Stephanie [name] [name]'s heart failure, I shared that heart failure is a chronic progressive disease. We reviewed the classification system according to the NYHA. We reviewed that Stg IV is the end stage and final phase of the disease. I shared that patients often hope that medical management will be helpful in delyaing progression though even in instances where all measures are pursued properly heart failure can worsen.  We discussed Stephanie [name]'s COPD which was secondary to a reaction to tetracycline.  We reviewed this caused a severe full body response inclusive of skin flaking and peeling, fevers, and generalized illness.  Stephanie [name] [name] notes that she almost died as a result of that.  From her perspective her COPD has been well managed.  Stephanie [name] [name] is understanding that her disease is quite severe and she is aware of the potential outcomes.  We discussed what her goals are at this time and she shares she does want more time for her great granddaughter who is 34 months old to see her and get to know her.  She does share with me her strong faith in God keeps her honest and allows her to stay in touch with reality.  She shares that she cannot control the outcome of the future but she does trust God will lead her the right way.  We reviewed the concerns associated with Stephanie [name] [name]'s current health state and the potential for worsening in the immediate future and over time. ____________________________ Addendum:  I have called and spoken to patients daughter, Stephanie [name] [name]. She shares that she will come in later this afternoon for further conversation(s) on her mothers status.   Decision Maker: Stephanie [name], [name] (Daughter): [phone] (Mobile)   SUMMARY OF RECOMMENDATIONS   Full code/Full scope of care - discussed the concerns associated with  CPR in patients with advanced heart failure and lung disease  Allow time for outcomes  Patient is very hopeful for improvements to spend time with her great granddaughter  The PMT will continue following along and offering support  Code Status/Advance Care Planning: FULL CODE  Palliative Prophylaxis:  Aspiration, Bowel Regimen, Delirium Protocol, Frequent Pain Assessment, Oral Care, Palliative Wound Care, and Turn Reposition  Additional Recommendations (Limitations, Scope, Preferences): Continue current measures  Psycho-social/Spiritual:  Desire for further Chaplaincy support: Yes Additional Recommendations: Education on CHF   Prognosis: Limited overall in the setting of advanced heart failure.   Discharge Planning: To be determined.   Vitals:   11/27/23 2337 11/28/23 0439  BP: 101/67 106/61  Pulse:    Resp: 18   Temp: 97.8 F (36.6 C) (!) 97.5 F (36.4 C)  SpO2: 99%     Intake/Output Summary (Last 24 hours) at 11/28/2023 0712 Last data filed at 11/28/2023 0445 Gross per 24 hour  Intake 809.82 ml  Output 1800 ml  Net -990.18 ml   Last Weight  Most recent update: 11/28/2023  4:43 AM    Weight  111.8 kg (246 lb 7.6 oz)            LABS: CBC:    Component Value Date/Time   WBC 8.7 11/27/2023 0500   HGB 9.9 (L) 11/27/2023 0500  HCT 31.0 (L) 11/27/2023 0500   PLT 114 (L) 11/27/2023 0500   MCV 85.4 11/27/2023 0500   NEUTROABS 5.3 11/16/2023 1201   LYMPHSABS 0.7 11/16/2023 1201   MONOABS 0.6 11/16/2023 1201   EOSABS 0.1 11/16/2023 1201   BASOSABS 0.0 11/16/2023 1201   Comprehensive Metabolic Panel:    Component Value Date/Time   NA 137 11/28/2023 0600   K 3.8 11/28/2023 0600   CL 99 11/28/2023 0600   CO2 25 11/28/2023 0600   BUN 29 (H) 11/28/2023 0600   CREATININE 1.73 (H) 11/28/2023 0600   CREATININE 0.95 02/11/2023 1445   GLUCOSE 152 (H) 11/28/2023 0600   CALCIUM  8.1 (L) 11/28/2023 0600   CALCIUM  8.9 08/04/2017 1032   AST 32 11/24/2023 0150    ALT 20 11/24/2023 0150   ALKPHOS 184 (H) 11/24/2023 0150   BILITOT 2.3 (H) 11/24/2023 0150   BILITOT 0.2 12/16/2015 1625   PROT 7.2 11/24/2023 0150   PROT 8.1 12/16/2015 1625   ALBUMIN 2.2 (L) 11/24/2023 0150   ALBUMIN 3.7 12/16/2015 1625   Gen:   Chronically ill-appearing elderly African-American female HEENT: moist mucous membranes CV: Regular rate and rhythm  PULM: On 2 L nasal cannula breathing is even and nonlabored ABD: soft/nontender  EXT: Generalized edema/+ anasarca in all extremities Neuro: Alert and oriented x3   PPS: 30%   This conversation/these recommendations were discussed with patient primary care team, Dr. Drusilla ______________________________________________________ [facility] Palliative Medicine Team Team Cell Phone: [phone] Please utilize secure chat with additional questions, if there is no response within 30 minutes please call the above phone number  Total Time: 75 Billing based on MDM: High  Palliative Medicine Team providers are available by phone from 7am to 7pm daily and can be reached through the team cell phone.  Should this patient require assistance outside of these hours, please call the patient's attending physician.

## 2023-11-28 NOTE — Progress Notes (Signed)
 [facility]  PROGRESS NOTE  Stephanie SHAMOON FMW:[id number] DOB: [birth year] DOA: 11/16/2023 PCP: Roni, [facility]   Brief HPI:   73 y.o. female with medical history significant of systolic heart failure, hypothyroidism, hyperlipidemia, COPD/asthma, history of DVT, fibromyalgia, GERD and type 2 diabetes mellitus with nephropathy; who presented to the hospital secondary to increased lower extremity swelling, shortness of breath with activity/orthopnea and weight gain.  Patient reports symptom has been present for the last 3 to 4 days and worsening; she was scheduled to be seen by cardiology service as an outpatient but unfortunately due to worsening of her symptoms did not make it and presented to the emergency department instead.     Of note, patient reports to be compliant with her medications.   ED:  Demonstrating elevated BNP, signs of fluid overload on physical exam and a chest x-ray with vascular congestion/interstitial edema.  IV Lasix  was started      Assessment/Plan:   Acute on chronic systolic heart failure -Bilaterally heart failure, EF 35 to 40% -Repeat 2d echo with EF 35-40% with regional WMA, mildly reduced RV function and severely enlarged RV size  - Presenting with complaint of weight gain, orthopnea, lower extremity swelling and elevated BNP -presenting BNP 14,798.0 - Cardiology following, Lasix  dose increased to 120 mg 3 times daily - Continue  Bisoprolol , Entresto  stopped due to worsening creatinine -continued on heparin  gtt. plan was to get LHC in the hospital however, currently on hold due to worsening CHF and renal insufficiency -Plan for outpatient cardiac cath as per Dr. Ladona - Underwent right heart cath, patient has end-stage biventricular failure.  Recommends palliative consult due to poor functional status and comorbidities.  Started on milrinone per cardiology - Palliative care consultation obtained for clarification of goals of  care  Diarrhea -Resolved -Patient had full episodes of loose stools yesterday -No loose stools this morning   COPD/asthma - Appears to be stable and currently no signs of acute exacerbation - Continue bronchodilator management.   Type 2 diabetes with nephropathy - Update A1c - Checking CBG q. ACHS, SSI coverage -CBG well-controlled   Acute on Chronic kidney disease stage IIIb  - With a slightly increase in patient's creatinine from recent values most likely in the setting of decreased perfusion with CHF exacerbation -Cr higher today to 1.9. On IV lasix  per Cardiology Entresto  stopped due to worsening creatinine - Follow renal function in a.m.   Hypertension - Stable on Lasix  drip, Entresto  - Continue current antihypertensive agent -Holding home medication spironolactone   GERD - Continue PPI   Hypothyroidism - TSH: 6.73 -Checking free T3, T4 - Continue Synthroid .   Depression/anxiety/neuropathy and Fibromyalgia - Continue Cymbalta  and Atarax  - Continue Lyrica  and Robaxin  - Complained of increased pain this AM. Added PRN IV dilaudid  for breakthrough    History of DVT and prior CVA - No acute neurologic deficit - Heparin  discontinued.  Started on apixaban    Hyperlipidemia - Resume outpatient Repatha  after discharge   Constipation -Pt reports good results recently with cathartics    Medications     apixaban   5 mg Oral BID   arformoterol  15 mcg Nebulization BID   And   umeclidinium bromide  1 puff Inhalation Daily   vitamin C   500 mg Oral Daily   atorvastatin   80 mg Oral Daily   bisoprolol   5 mg Oral Daily   Chlorhexidine  Gluconate Cloth  6 each Topical Daily   cycloSPORINE  1 drop Both Eyes BID  DULoxetine   60 mg Oral BID   insulin  aspart  0-15 Units Subcutaneous TID WC   insulin  aspart  0-5 Units Subcutaneous QHS   levothyroxine   100 mcg Oral QAC breakfast   loratadine   10 mg Oral Daily   pantoprazole   40 mg Oral Daily   potassium chloride   40 mEq  Oral Daily   pregabalin   75 mg Oral BID   sodium chloride  flush  10-40 mL Intracatheter Q12H   sodium chloride  flush  3 mL Intravenous Q12H   traZODone   150 mg Oral QHS   ursodiol  300 mg Oral BID     Data Reviewed:   CBG:  Recent Labs  Lab 11/27/23 0737 11/27/23 1202 11/27/23 1613 11/27/23 2135 11/28/23 0740  GLUCAP 119* 131* 113* 130* 141*    SpO2: 99 % O2 Flow Rate (L/min): 2 L/min FiO2 (%): 28 %    Vitals:   11/27/23 1943 11/27/23 2325 11/27/23 2337 11/28/23 0439  BP:   101/67 106/61  Pulse:      Resp:   18   Temp: 97.6 F (36.4 C)  97.8 F (36.6 C) (!) 97.5 F (36.4 C)  TempSrc: Axillary  Oral Oral  SpO2:  100% 99%   Weight:    111.8 kg  Height:          Data Reviewed:  Basic Metabolic Panel: Recent Labs  Lab 11/21/23 1442 11/22/23 0652 11/22/23 0652 11/23/23 0500 11/24/23 0150 11/25/23 0554 11/26/23 0947 11/28/23 0600  NA  --  131*   < > 133* 132* 133* 133* 137  K  --  3.6   < > 3.9 3.8 3.8 3.9 3.8  CL  --  96*   < > 96* 96* 97* 97* 99  CO2  --  26   < > 25 25 25 24 25   GLUCOSE  --  89   < > 106* 111* 94 122* 152*  BUN  --  29*   < > 30* 30* 30* 28* 29*  CREATININE  --  1.85*   < > 1.94* 1.93* 1.95* 1.90* 1.73*  CALCIUM   --  8.3*   < > 8.6* 8.5* 8.3* 8.4* 8.1*  MG 1.7 2.0  --   --  2.1  --   --  1.8   < > = values in this interval not displayed.    CBC: Recent Labs  Lab 11/23/23 0500 11/24/23 0150 11/25/23 0554 11/26/23 0536 11/27/23 0500  WBC 8.3 9.8 8.5 9.9 8.7  HGB 10.9* 10.3* 9.8* 10.2* 9.9*  HCT 34.6* 32.6* 31.2* 31.9* 31.0*  MCV 84.2 84.2 84.8 84.8 85.4  PLT 163 151 133* 125* 114*    LFT Recent Labs  Lab 11/22/23 0652 11/24/23 0150  AST 28 32  ALT 19 20  ALKPHOS 174* 184*  BILITOT 2.0* 2.3*  PROT 6.9 7.2  ALBUMIN 2.0* 2.2*     Antibiotics: Anti-infectives (From admission, onward)    None        DVT prophylaxis: Heparin   Code Status: Full code  Family Communication: No family at  bedside   CONSULTS cardiology   Subjective   Patient seen and examined.  Feeling better after starting IV milrinone  Objective    Physical Examination:  Appears in no acute distress S1-S2, regular, no murmur auscultated Abdomen is soft, nontender, no organomegaly Extremities trace edema in the lower extremities   Status is: Inpatient:             Sabas GORMAN Brod  [facility] If 7PM-7AM, please contact night-coverage at [email or web address], Office  [phone]   11/28/2023, 7:53 AM  LOS: 12 days

## 2023-11-29 ENCOUNTER — Other Ambulatory Visit: Payer: Self-pay

## 2023-11-29 DIAGNOSIS — I5023 Acute on chronic systolic (congestive) heart failure: Secondary | ICD-10-CM | POA: Diagnosis not present

## 2023-11-29 DIAGNOSIS — N1832 Chronic kidney disease, stage 3b: Secondary | ICD-10-CM

## 2023-11-29 DIAGNOSIS — Z7189 Other specified counseling: Secondary | ICD-10-CM

## 2023-11-29 DIAGNOSIS — I10 Essential (primary) hypertension: Secondary | ICD-10-CM

## 2023-11-29 DIAGNOSIS — Z515 Encounter for palliative care: Secondary | ICD-10-CM

## 2023-11-29 DIAGNOSIS — I5082 Biventricular heart failure: Secondary | ICD-10-CM | POA: Diagnosis not present

## 2023-11-29 DIAGNOSIS — I2510 Atherosclerotic heart disease of native coronary artery without angina pectoris: Secondary | ICD-10-CM | POA: Diagnosis not present

## 2023-11-29 DIAGNOSIS — I509 Heart failure, unspecified: Secondary | ICD-10-CM | POA: Diagnosis not present

## 2023-11-29 DIAGNOSIS — N179 Acute kidney failure, unspecified: Secondary | ICD-10-CM | POA: Diagnosis not present

## 2023-11-29 LAB — COMPREHENSIVE METABOLIC PANEL WITH GFR
ALT: 20 U/L (ref 0–44)
AST: 34 U/L (ref 15–41)
Albumin: 2.4 g/dL — ABNORMAL LOW (ref 3.5–5.0)
Alkaline Phosphatase: 196 U/L — ABNORMAL HIGH (ref 38–126)
Anion gap: 12 (ref 5–15)
BUN: 29 mg/dL — ABNORMAL HIGH (ref 8–23)
CO2: 21 mmol/L — ABNORMAL LOW (ref 22–32)
Calcium: 8.2 mg/dL — ABNORMAL LOW (ref 8.9–10.3)
Chloride: 99 mmol/L (ref 98–111)
Creatinine, Ser: 1.76 mg/dL — ABNORMAL HIGH (ref 0.44–1.00)
GFR, Estimated: 30 mL/min — ABNORMAL LOW (ref >60)
Glucose, Bld: 140 mg/dL — ABNORMAL HIGH (ref 70–99)
Potassium: 4 mmol/L (ref 3.5–5.1)
Sodium: 132 mmol/L — ABNORMAL LOW (ref 135–145)
Total Bilirubin: 2.3 mg/dL — ABNORMAL HIGH (ref 0.0–1.2)
Total Protein: 7.8 g/dL (ref 6.5–8.1)

## 2023-11-29 LAB — GLUCOSE, CAPILLARY
Glucose-Capillary: 106 mg/dL — ABNORMAL HIGH (ref 70–99)
Glucose-Capillary: 137 mg/dL — ABNORMAL HIGH (ref 70–99)
Glucose-Capillary: 136 mg/dL — ABNORMAL HIGH (ref 70–99)
Glucose-Capillary: 141 mg/dL — ABNORMAL HIGH (ref 70–99)

## 2023-11-29 LAB — IRON AND TIBC
Iron: 95 ug/dL (ref 28–170)
Saturation Ratios: 46 % — ABNORMAL HIGH (ref 10.4–31.8)
TIBC: 206 ug/dL — ABNORMAL LOW (ref 250–450)
UIBC: 111 ug/dL

## 2023-11-29 LAB — MAGNESIUM: Magnesium: 1.8 mg/dL (ref 1.7–2.4)

## 2023-11-29 LAB — CBC WITH DIFFERENTIAL/PLATELET
Abs Immature Granulocytes: 0.14 K/uL — ABNORMAL HIGH (ref 0.00–0.07)
Basophils Absolute: 0 K/uL (ref 0.0–0.1)
Basophils Relative: 0 %
Eosinophils Absolute: 0.1 K/uL (ref 0.0–0.5)
Eosinophils Relative: 2 %
HCT: 27.6 % — ABNORMAL LOW (ref 36.0–46.0)
Hemoglobin: 9.1 g/dL — ABNORMAL LOW (ref 12.0–15.0)
Immature Granulocytes: 2 %
Lymphocytes Relative: 12 %
Lymphs Abs: 1 K/uL (ref 0.7–4.0)
MCH: 27.4 pg (ref 26.0–34.0)
MCHC: 33 g/dL (ref 30.0–36.0)
MCV: 83.1 fL (ref 80.0–100.0)
Monocytes Absolute: 0.9 K/uL (ref 0.1–1.0)
Monocytes Relative: 10 %
Neutro Abs: 6.7 K/uL (ref 1.7–7.7)
Neutrophils Relative %: 74 %
Platelets: 118 K/uL — ABNORMAL LOW (ref 150–400)
RBC: 3.32 MIL/uL — ABNORMAL LOW (ref 3.87–5.11)
RDW: 15 % (ref 11.5–15.5)
WBC: 9 K/uL (ref 4.0–10.5)
nRBC: 0 % (ref 0.0–0.2)

## 2023-11-29 LAB — COOXEMETRY PANEL
Carboxyhemoglobin: 3.1 % — ABNORMAL HIGH (ref 0.5–1.5)
Methemoglobin: <0.7 % (ref 0.0–1.5)
O2 Saturation: 70.3 %
Total hemoglobin: 9.1 g/dL — ABNORMAL LOW (ref 12.0–16.0)

## 2023-11-29 LAB — BASIC METABOLIC PANEL WITH GFR
Anion gap: 9 (ref 5–15)
BUN: 29 mg/dL — ABNORMAL HIGH (ref 8–23)
CO2: 24 mmol/L (ref 22–32)
Calcium: 8.3 mg/dL — ABNORMAL LOW (ref 8.9–10.3)
Chloride: 99 mmol/L (ref 98–111)
Creatinine, Ser: 1.75 mg/dL — ABNORMAL HIGH (ref 0.44–1.00)
GFR, Estimated: 30 mL/min — ABNORMAL LOW (ref >60)
Glucose, Bld: 144 mg/dL — ABNORMAL HIGH (ref 70–99)
Potassium: 3.7 mmol/L (ref 3.5–5.1)
Sodium: 132 mmol/L — ABNORMAL LOW (ref 135–145)

## 2023-11-29 LAB — RETICULOCYTES
Immature Retic Fract: 25.1 % — ABNORMAL HIGH (ref 2.3–15.9)
RBC.: 3.31 MIL/uL — ABNORMAL LOW (ref 3.87–5.11)
Retic Count, Absolute: 63.2 K/uL (ref 19.0–186.0)
Retic Ct Pct: 1.9 % (ref 0.4–3.1)

## 2023-11-29 LAB — AMMONIA: Ammonia: 56 umol/L — ABNORMAL HIGH (ref 9–35)

## 2023-11-29 LAB — FERRITIN: Ferritin: 324 ng/mL — ABNORMAL HIGH (ref 11–307)

## 2023-11-29 LAB — FOLATE: Folate: 9.1 ng/mL (ref >5.9)

## 2023-11-29 LAB — VITAMIN B12: Vitamin B-12: 1862 pg/mL — ABNORMAL HIGH (ref 180–914)

## 2023-11-29 MED ORDER — MAGNESIUM SULFATE 2 GM/50ML IV SOLN
2.0000 g | Freq: Once | INTRAVENOUS | Status: AC
  Administered 2023-11-29: 2 g via INTRAVENOUS
  Filled 2023-11-29: qty 50

## 2023-11-29 MED ORDER — ACETAZOLAMIDE 250 MG PO TABS
500.0000 mg | ORAL_TABLET | Freq: Two times a day (BID) | ORAL | Status: DC
  Administered 2023-11-29 – 2023-11-30 (×3): 500 mg via ORAL
  Filled 2023-11-29 (×3): qty 2

## 2023-11-29 MED ORDER — POTASSIUM CHLORIDE CRYS ER 20 MEQ PO TBCR
40.0000 meq | EXTENDED_RELEASE_TABLET | Freq: Once | ORAL | Status: AC
  Administered 2023-11-29: 40 meq via ORAL
  Filled 2023-11-29: qty 2

## 2023-11-29 MED ORDER — METOLAZONE 5 MG PO TABS
5.0000 mg | ORAL_TABLET | Freq: Once | ORAL | Status: AC
  Administered 2023-11-29: 5 mg via ORAL
  Filled 2023-11-29: qty 1

## 2023-11-29 MED ORDER — FUROSEMIDE 10 MG/ML IJ SOLN
20.0000 mg/h | INTRAVENOUS | Status: AC
  Administered 2023-11-29 – 2023-12-02 (×9): 20 mg/h via INTRAVENOUS
  Filled 2023-11-29 (×9): qty 20

## 2023-11-29 MED ORDER — FUROSEMIDE 10 MG/ML IJ SOLN
160.0000 mg | Freq: Once | INTRAVENOUS | Status: AC
  Administered 2023-11-29: 160 mg via INTRAVENOUS
  Filled 2023-11-29: qty 10

## 2023-11-29 NOTE — Progress Notes (Signed)
 Heart Failure Navigator Progress Note  Assessed for Heart & Vascular TOC clinic readiness.  Patient does not meet criteria due to Advanced Heart Failure Team was consulted. .   Navigator will sign off at this time.   Stephane Haddock, BSN, Scientist, clinical (histocompatibility and immunogenetics) Only

## 2023-11-29 NOTE — Progress Notes (Signed)
 RN received a call from day shift NT stating that patient had a bloody BM around 1700 yesterday evening. MD made aware and ordered labs to check levels.

## 2023-11-29 NOTE — Progress Notes (Signed)
 PT Cancellation Note  Patient Details Name: Stephanie [name] MRN: [id number] DOB: 03/06/1950   Cancelled Treatment:    Reason Eval/Treat Not Completed: Fatigue/lethargy limiting ability to participate;Pain limiting ability to participate this afternoon. Pt reports limited sleep overnight due to diarrhea. Will continue to follow and progress as tolerated.   Izetta Call, PT, DPT   Acute Rehabilitation Department Office [phone] Secure Chat Communication Preferred   Izetta JULIANNA Call 11/29/2023, 3:33 PM

## 2023-11-29 NOTE — Plan of Care (Signed)
  Problem: Education: Goal: Ability to describe self-care measures that may prevent or decrease complications (Diabetes Survival Skills Education) will improve Outcome: Progressing Goal: Individualized Educational Video(s) Outcome: Progressing   Problem: Coping: Goal: Ability to adjust to condition or change in health will improve Outcome: Progressing   Problem: Fluid Volume: Goal: Ability to maintain a balanced intake and output will improve Outcome: Progressing   Problem: Metabolic: Goal: Ability to maintain appropriate glucose levels will improve Outcome: Progressing   Problem: Nutritional: Goal: Maintenance of adequate nutrition will improve Outcome: Progressing Goal: Progress toward achieving an optimal weight will improve Outcome: Progressing   Problem: Skin Integrity: Goal: Risk for impaired skin integrity will decrease Outcome: Progressing   Problem: Tissue Perfusion: Goal: Adequacy of tissue perfusion will improve Outcome: Progressing   Problem: Education: Goal: Knowledge of General Education information will improve Description: Including pain rating scale, medication(s)/side effects and non-pharmacologic comfort measures Outcome: Progressing   Problem: Health Behavior/Discharge Planning: Goal: Ability to manage health-related needs will improve Outcome: Progressing   Problem: Clinical Measurements: Goal: Ability to maintain clinical measurements within normal limits will improve Outcome: Progressing Goal: Will remain free from infection Outcome: Progressing Goal: Diagnostic test results will improve Outcome: Progressing Goal: Respiratory complications will improve Outcome: Progressing Goal: Cardiovascular complication will be avoided Outcome: Progressing   Problem: Activity: Goal: Risk for activity intolerance will decrease Outcome: Progressing   Problem: Nutrition: Goal: Adequate nutrition will be maintained Outcome: Progressing   Problem:  Coping: Goal: Level of anxiety will decrease Outcome: Progressing   Problem: Elimination: Goal: Will not experience complications related to bowel motility Outcome: Progressing Goal: Will not experience complications related to urinary retention Outcome: Progressing   Problem: Pain Managment: Goal: General experience of comfort will improve and/or be controlled Outcome: Progressing   Problem: Safety: Goal: Ability to remain free from injury will improve Outcome: Progressing   Problem: Skin Integrity: Goal: Risk for impaired skin integrity will decrease Outcome: Progressing   Problem: Education: Goal: Ability to demonstrate management of disease process will improve Outcome: Progressing Goal: Ability to verbalize understanding of medication therapies will improve Outcome: Progressing Goal: Individualized Educational Video(s) Outcome: Progressing   Problem: Activity: Goal: Capacity to carry out activities will improve Outcome: Progressing   Problem: Cardiac: Goal: Ability to achieve and maintain adequate cardiopulmonary perfusion will improve Outcome: Progressing   Problem: Education: Goal: Understanding of CV disease, CV risk reduction, and recovery process will improve Outcome: Progressing Goal: Individualized Educational Video(s) Outcome: Progressing   Problem: Activity: Goal: Ability to return to baseline activity level will improve Outcome: Progressing   Problem: Health Behavior/Discharge Planning: Goal: Ability to safely manage health-related needs after discharge will improve Outcome: Progressing

## 2023-11-29 NOTE — Progress Notes (Signed)
 Palliative Medicine Inpatient Follow Up Note   HPI: 73 y.o. female with medical history significant of systolic heart failure, hypothyroidism, hyperlipidemia, COPD/asthma, history of DVT, fibromyalgia, GERD and type 2 diabetes mellitus with nephropathy; who presented to the hospital secondary to increased lower extremity swelling, shortness of breath with activity/orthopnea and weight gain.  Palliative care has been asked to support additional goals of care conversations in the setting of heart failure - NYHA Class IV.    Today's Discussion 11/29/2023  *Please note that this is a verbal dictation therefore any spelling or grammatical errors are due to the Dragon [employer] One system interpretation.  Chart reviewed inclusive of vital signs, progress notes, laboratory results, and diagnostic images.   Created space and opportunity for patient to explore thoughts feelings and fears regarding current medical situation.  I visited the patient at bedside today. She was awake, alert, and oriented, without signs of acute distress. Her oxygen  saturation was stable at 98% on 2L/min via nasal cannula. She was able to express her needs and concerns and engage in meaningful conversation.  When asked how she was feeling, she shared that she felt slightly better than yesterday. Her breathing is improved. She remains hopeful for clinical improvement but understands that her recovery may be slow due to multiple comorbidities, particularly her chronic and progressive heart failure. She also understands that there will be some wins and setbacks as she navigates her health-illness journey.   We reviewed her goals of care. At this time, she wishes to remain full code and continue with current medical management to an extend possible in hopes for clinical improvement. She expressed interest in establishing an advance directive, and I offered chaplain services to assist with this process, which she  appreciated.  All questions and concerns were addressed during the visit.  Patient and her family face treatment option decisions, advanced directive decisions and anticipatory care needs.   Palliative Support Provided.   Objective Assessment: Vital Signs Vitals:   11/29/23 0734 11/29/23 0824  BP: (!) 135/113   Pulse:    Resp: 18 19  Temp: 97.6 F (36.4 C)   SpO2:      Intake/Output Summary (Last 24 hours) at 11/29/2023 1108 Last data filed at 11/29/2023 0700 Gross per 24 hour  Intake 690 ml  Output 1210 ml  Net -520 ml   Last Weight  Most recent update: 11/29/2023  6:20 AM    Weight  110.1 kg (242 lb 12.8 oz)             Gen:   Obese, Chronically ill-appearing HEENT: moist mucous membranes CV: Regular rate and rhythm  PULM: On 2 L nasal cannula breathing is even and nonlabored ABD: soft/nontender  EXT: Generalized edema/+ anasarca in all extremities Neuro: Alert and oriented x3   SUMMARY OF RECOMMENDATIONS   Full code/Full scope of care  Allow time for outcomes Spiritual consult placed to assist with Advance Directive creation The PMT will continue following along and offering support  Time Spent: 35 minutes  Detailed review of medical records (labs, imaging, vital signs), medically appropriate exam, discussed with treatment team, counseling and education to patient, family, & staff, documenting clinical information, coordination of care.   ______________________________________________________________________________________ Kathlyne Bolder NP-C [place] [facility] Palliative Medicine Team Team Cell Phone: [phone] Please utilize secure chat with additional questions, if there is no response within 30 minutes please call the above phone number  Palliative Medicine Team providers are available by phone from 7am to 7pm daily  and can be reached through the team cell phone.  Should this patient require assistance outside of these hours, please call the patient's  attending physician.

## 2023-11-29 NOTE — Consult Note (Addendum)
 Advanced Heart Failure Team Consult Note   Primary Physician: [facility] Cardiologist:  Jayson Sierras, MD  Reason for Consultation:  acute on chronic biventricular/ RV predominant heart failure  HPI:    Stephanie [name] is seen today for evaluation of acute on chronic biventricular/ RV predominant heart failure at the request of Dr. Sierras, Cardiology.   73 y/o female w/ chronic systolic heart failure, hypertension, CKD IIIb, T2DM, OSA and h/o LE DVT.   Followed by Dr. Sierras. Most recent echo, prior to this admission, was 3/25. LVEF 40%, RV mildly reduced. Global HK.   Initially presented to [facility] w/ a/c CHF w/ NYHA IIIb symptoms and marked fluid overload. Echo showed EF 35-40%, severe RV enlargment w/ mildly reduced systolic fx, estimated RVSP 32 mmHg. Mild MR/TR. Started on IV diuretics and transferred to [facility] for RHC.   RHC on 10/8 demonstrated severe biventricular HF (R>L) with significant volume overload and low output (RA 27, PA 48/26, PCW 22, PVR 2.2, TD CI 1.8, PAPi 0.81). Hemodynamics c/w end-stage predominant RV failure. She has been started on trial of milrinone to help w/ palliative diuresis.  Now on milrinone 0.25 mcg/kg/min. AHF team asked to assist w/ further management.   She continues w/ marked volume overload despite milrinone and high doses of IV Lasix , currently getting 120 mg tid. No respiratory difficulty. Labs pending for today. Yesterday's SCr was 1.73 (1.3 on admission c/w baseline).   Palliative care team has been consulted and patient understands severity of illness and knows overall prognosis is poor.     RHC 11/24/23   Findings:   RA =  27 RV = 52/26 PA =  48/26 (36) PCW = 22 Fick cardiac output/index = 6.3/2.8 Thermo CO/CI = 4.0/1.8 PVR = 2.2 (Fick) 3.5 (TD) Ao sat = 97% PA sat = 62%, 64% PAPi = 0.81  Echo 11/16/23 1. Left ventricular ejection fraction, by estimation, is 35 to 40%. Left  ventricular ejection fraction by 3D volume  is 33 %. The left ventricle has  moderately decreased function. The left ventricle demonstrates regional  wall motion abnormalities (see  scoring diagram/findings for description). The left ventricular internal  cavity size was mildly dilated. Left ventricular diastolic parameters are  indeterminate.   2. Right ventricular systolic function is mildly reduced. The right  ventricular size is severely enlarged. Moderately increased right  ventricular wall thickness. There is normal pulmonary artery systolic  pressure. The estimated right ventricular  systolic pressure is 31.8 mmHg.   3. Large pleural effusion.   4. The mitral valve is normal in structure. Mild mitral valve  regurgitation. No evidence of mitral stenosis.   5. The aortic valve is tricuspid. Aortic valve regurgitation is not  visualized. No aortic stenosis is present.   6. The inferior vena cava is dilated in size with >50% respiratory  variability, suggesting right atrial pressure of 8 mmHg.   Home Medications Prior to Admission medications   Medication Sig Start Date End Date Taking? Authorizing Provider  albuterol  (PROVENTIL ) (2.5 MG/3ML) 0.083% nebulizer solution Take 2.5 mg by nebulization every 6 (six) hours as needed for wheezing or shortness of breath.   Yes [provider]  albuterol  (VENTOLIN  HFA) 108 (90 Base) MCG/ACT inhaler Inhale 1-2 puffs into the lungs every 6 (six) hours as needed for wheezing or shortness of breath. 03/21/21  Yes Stuart Vernell Norris, PA-C  Alcohol Swabs (ALCOHOL PADS) 70 % PADS SMARTSIG:Pledget(s) Topical 4 Times Daily 08/12/20  Yes  [provider]  apixaban  (ELIQUIS ) 5 MG TABS tablet Take 1 tablet (5 mg total) by mouth 2 (two) times daily. 05/21/21  Yes Arrien, Mauricio Daniel, MD  Ascorbic Acid  (VITAMIN C ) 1000 MG tablet Take 500 mg by mouth daily.   Yes [provider]  atorvastatin  (LIPITOR ) 80 MG tablet Take 1 tablet (80 mg total) by mouth daily. 05/22/21 06/24/58 Yes  Arrien, Elidia Sieving, MD  bisoprolol  (ZEBETA ) 10 MG tablet TAKE 1 TABLET(10 MG) BY MOUTH IN THE MORNING AND AT BEDTIME 06/08/23  Yes Strader, [place] M, PA-C  Cetirizine  HCl 10 MG CAPS Take 10 mg by mouth daily.   Yes [provider]  Cholecalciferol  (VITAMIN D ) 2000 units tablet Take 4,000 Units by mouth daily.  Patient taking differently: Take 2,000 Units by mouth daily.   Yes [provider]  cimetidine  (TAGAMET ) 200 MG tablet Take 0.5 tablets (100 mg total) by mouth daily as needed. 03/22/20  Yes Ricky Fines, MD  CINNAMON PO Take 1-2 capsules by mouth 3 (three) times daily.   Yes [provider]  clobetasol  ointment (TEMOVATE ) 0.05 % Apply 1 application  topically as needed (imflammation). 06/20/15  Yes [provider]  DULoxetine  (CYMBALTA ) 60 MG capsule Take 60 mg by mouth 2 (two) times daily.   Yes [provider]  Evolocumab  (REPATHA  SURECLICK) 140 MG/ML SOAJ Inject 140 mg into the skin every 14 (fourteen) days. 03/18/23  Yes Whitfield Raisin, NP  fluticasone  (FLONASE ) 50 MCG/ACT nasal spray SMARTSIG:2 Spray(s) Both Nares Daily PRN 04/05/23  Yes [provider]  guaiFENesin  (ROBITUSSIN) 100 MG/5ML SOLN Take 5 mLs (100 mg total) by mouth every 4 (four) hours as needed for cough or to loosen phlegm. Patient taking differently: Take 5 mLs by mouth as needed for cough or to loosen phlegm. 03/17/20  Yes Shah, Pratik D, DO  hydrOXYzine  (ATARAX /VISTARIL ) 25 MG tablet Take 25 mg by mouth every 8 (eight) hours as needed for itching.  06/20/15  Yes [provider]  insulin  degludec (TRESIBA  FLEXTOUCH) 100 UNIT/ML FlexTouch Pen Inject 15 Units into the skin at bedtime. 06/14/23  Yes Therisa Benton PARAS, NP  KLAYESTA  powder APPLY TOPICALLY THREE TIMES DAILY 09/20/23  Yes Therisa Benton PARAS, NP  levothyroxine  (SYNTHROID ) 100 MCG tablet Take 1 tablet (100 mcg total) by mouth daily before breakfast. 10/21/23  Yes Reardon, Benton PARAS, NP  lidocaine   (LIDODERM ) 5 % Place 1 patch onto the skin daily. Remove & Discard patch within 12 hours or as directed by MD Patient taking differently: Place 1 patch onto the skin daily as needed. Remove & Discard patch within 12 hours or as directed by MD 01/29/22  Yes Leath-Warren, Etta PARAS, NP  lubiprostone  (AMITIZA ) 24 MCG capsule Take 1 capsule (24 mcg total) by mouth 2 (two) times daily with a meal. Patient taking differently: Take 24 mcg by mouth 2 (two) times daily as needed for constipation. 01/21/22  Yes Ezzard Sonny RAMAN, PA-C  LYRICA  75 MG capsule Take 75 mg by mouth 2 (two) times daily.  07/18/15  Yes [provider]  methocarbamol  (ROBAXIN ) 500 MG tablet Take 1 tablet (500 mg total) by mouth 3 (three) times daily as needed. Patient taking differently: Take 500 mg by mouth 3 (three) times daily as needed for muscle spasms. 08/06/17  Yes Kay Kemps, MD  pantoprazole  (PROTONIX ) 40 MG tablet Take 1 tablet (40 mg total) by mouth daily before breakfast. 01/21/22  Yes Ezzard Sonny RAMAN, PA-C  RESTASIS 0.05 % ophthalmic emulsion  Place 1 drop into both eyes 2 (two) times daily. 08/21/20  Yes [provider]  sacubitril-valsartan (ENTRESTO ) 24-26 MG Take 1 tablet by mouth 2 (two) times daily. 08/03/23  Yes [name] [name], MD  Simethicone 125 MG CAPS Take 1 capsule every day by oral route as needed.   Yes [provider]  spironolactone (ALDACTONE) 25 MG tablet Take 25 mg by mouth daily.   Yes [provider]  Tiotropium Bromide-Olodaterol (STIOLTO RESPIMAT ) 2.5-2.5 MCG/ACT AERS Inhale 2 puffs into the lungs daily. 03/19/23  Yes Darlean Ozell NOVAK, MD  tirzepatide  (MOUNJARO ) 12.5 MG/0.5ML Pen Inject 12.5 mg into the skin once a week. 06/14/23  Yes Therisa Benton PARAS, NP  torsemide  (DEMADEX ) 20 MG tablet Take 2 tablets (40 mg total) by mouth daily. Patient taking differently: Take 80 mg by mouth daily. 06/25/23  Yes [name] [name], MD  traMADol  (ULTRAM ) 50 MG tablet Take 50 mg by  mouth 3 (three) times daily as needed for moderate pain. 01/07/18  Yes [provider]  traZODone  (DESYREL ) 150 MG tablet Take 150 mg by mouth at bedtime. 04/07/22  Yes [provider]  ursodiol (ACTIGALL) 300 MG capsule Take 300 mg by mouth 2 (two) times daily.   Yes [provider]  Accu-Chek FastClix Lancets MISC Use 1 lancet to check blood glucose four (4) times daily as directed by provider. 09/23/23   Therisa Benton PARAS, NP  ACCU-CHEK GUIDE test strip 4 (four) times daily. 07/02/20   [provider]  ARNUITY ELLIPTA  100 MCG/ACT AEPB Inhale 1 puff into the lungs daily. 04/11/23   [provider]  Blood Glucose Calibration (ACCU-CHEK GUIDE CONTROL) LIQD See admin instructions. 07/02/20   [provider]  Blood Glucose Monitoring Suppl (ACCU-CHEK GUIDE ME) w/Device KIT Use Glucose Meter to monitor blood glucose readings four(4) times daily as directed by provider. 09/23/23   Therisa Benton PARAS, NP  Continuous Glucose Receiver (FREESTYLE LIBRE 3 READER) DEVI USE TO MONITOR GLUCOSE CONTINUOUSLY AS DIRECTED 06/15/23   Therisa Benton PARAS, NP  Continuous Glucose Sensor (FREESTYLE LIBRE 3 PLUS SENSOR) MISC USE TO MONITOR GLUCOSE CONTINUOUSLY AS DIRECTED. CHANGE SENSOR EVERY 15 DAYS. 06/17/23   Therisa Benton PARAS, NP  EASY COMFORT PEN NEEDLES 31G X 5 MM MISC INJECT IN SULIN EVERY DAY AS DIRECTED 06/02/21   [provider]  glucose blood (ACCU-CHEK GUIDE TEST) test strip Use 1 test strip four(4) times daily to monitor blood glucose as directed by provider. 09/23/23   Therisa Benton PARAS, NP  Lancet Devices (EASY MINI EJECT LANCING DEVICE) MISC 4 (four) times daily. 07/02/20   [provider]  Skin Protectants, Misc. 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   Type 2 diabetes mellitus (HCC)     Past Surgical History: Past Surgical History:  Procedure Laterality Date  ABDOMINAL HYSTERECTOMY  partial   CARPAL TUNNEL RELEASE Right 1991   CATARACT EXTRACTION W/PHACO Right 05/08/2013   Procedure: CATARACT EXTRACTION PHACO AND INTRAOCULAR LENS PLACEMENT (IOC);  Surgeon: Cherene Mania, MD;  Location: [facility];  Service: Ophthalmology;  Laterality: Right;  CDE 10.31   CATARACT EXTRACTION W/PHACO Left 08/17/2013   Procedure: CATARACT EXTRACTION PHACO AND INTRAOCULAR LENS PLACEMENT (IOC);  Surgeon: Cherene Mania, MD;  Location: [facility];  Service: Ophthalmology;  Laterality: Left;  CDE:9.03   CHOLECYSTECTOMY  1971   COLONOSCOPY WITH PROPOFOL  N/A 01/06/2016   Dr. Shaaron: diverticulosis    DENTAL SURGERY     ESOPHAGEAL BRUSHING  08/29/2019   Procedure: ESOPHAGEAL BRUSHING;  Surgeon: Shaaron Lamar HERO, MD;  Location: [facility];  Service: Endoscopy;;   ESOPHAGOGASTRODUODENOSCOPY (EGD) WITH PROPOFOL  N/A 01/06/2016   Dr. Shaaron: normal s/p empiric dilation    ESOPHAGOGASTRODUODENOSCOPY (EGD) WITH PROPOFOL  N/A 08/29/2019   esophageal plaques vs medication residue adherent to tubular esophagus s/p KOH brushing and dilation. Medium-sized hiatal hernia. + for candida. Diflucan .    EYE SURGERY     IR BONE MARROW BIOPSY & ASPIRATION  03/31/2022   MALONEY DILATION N/A 01/06/2016   Procedure: AGAPITO DILATION;  Surgeon: Lamar HERO Shaaron, MD;  Location: [facility];  Service: Endoscopy;  Laterality: N/A;   MALONEY DILATION N/A 08/29/2019   Procedure: AGAPITO DILATION;  Surgeon: Shaaron Lamar HERO,  MD;  Location: [facility];  Service: Endoscopy;  Laterality: N/A;   REVERSE SHOULDER ARTHROPLASTY Right 08/06/2017   Procedure: RIGHT REVERSE SHOULDER ARTHROPLASTY;  Surgeon: Kay Kemps, MD;  Location: [facility] OR;  Service: Orthopedics;  Laterality: Right;   RIGHT HEART CATH N/A 11/26/2023   Procedure: RIGHT HEART CATH;  Surgeon: Cherrie Toribio SAUNDERS, MD;  Location: [facility];  Service: Cardiovascular;  Laterality: N/A;   WRIST GANGLION EXCISION Left     Family History: Family History  Problem Relation Age of Onset   Hypertension Mother    Diabetes Mother    COPD Mother    Arthritis Mother    Diabetes Father    Arthritis Father    Dementia Father    CAD Father    Hypothyroidism Sister    Diabetes Brother    Stroke Paternal Aunt    Colon cancer Niece    Colon polyps Neg Hx    Sleep apnea Neg Hx     Social History: Social History   Socioeconomic History   Marital status: Divorced    Spouse name: Not on file   Number of children: Not on file   Years of education: Not on file   Highest education level: Not on file  Occupational History   Not on file  Tobacco Use   Smoking status: Former    Current packs/day: 0.00    Average packs/day: 0.3 packs/day for 30.0 years (7.5 ttl pk-yrs)    Types: Cigarettes    Start date: 02/16/1981    Quit date: 02/17/2011    Years since quitting: 12.7   Smokeless tobacco: Never  Vaping Use   Vaping status: Never Used  Substance and Sexual Activity   Alcohol use: Not Currently    Comment: rare   Drug use: No   Sexual activity: Not Currently    Birth control/protection: Surgical  Other Topics Concern   Not on file  Social History Narrative   Pt lives with daughter    Retired    Social Drivers of Corporate investment banker Strain: Not on  file  Food Insecurity: No Food Insecurity (11/17/2023)   Hunger Vital Sign    Worried About Running Out of Food in the Last Year: Never true    Ran Out of Food in the Last Year: Never true   Transportation Needs: No Transportation Needs (11/17/2023)   PRAPARE - Administrator, Civil Service (Medical): No    Lack of Transportation (Non-Medical): No  Physical Activity: Not on file  Stress: Not on file  Social Connections: Moderately Isolated (11/17/2023)   Social Connection and Isolation Panel    Frequency of Communication with Friends and Family: More than three times a week    Frequency of Social Gatherings with Friends and Family: More than three times a week    Attends Religious Services: 1 to 4 times per year    Active Member of [employer] or Organizations: No    Attends Banker Meetings: Never    Marital Status: Divorced    Allergies:  Allergies  Allergen Reactions   Tetracyclines & Related Anaphylaxis and Rash   Banana Hives and Nausea And Vomiting   Jardiance  [Empagliflozin ]     Yeast infections, fatigue   Penicillins Rash and Other (See Comments)    Objective:    Vital Signs:   Temp:  [97.6 F (36.4 C)-98 F (36.7 C)] 97.6 F (36.4 C) (10/13 0734) Pulse Rate:  [78-88] 87 (10/13 0619) Resp:  [14-22] 19 (10/13 0824) BP: (83-135)/(63-113) 135/113 (10/13 0734) SpO2:  [92 %-100 %] 97 % (10/13 0619) Weight:  [110.1 kg] 110.1 kg (10/13 0619) Last BM Date : 11/28/23  Weight change: Filed Weights   11/27/23 0451 11/28/23 0439 11/29/23 0619  Weight: 112.2 kg 111.8 kg 110.1 kg    Intake/Output:   Intake/Output Summary (Last 24 hours) at 11/29/2023 0919 Last data filed at 11/29/2023 0700 Gross per 24 hour  Intake 690 ml  Output 1210 ml  Net -520 ml      Physical Exam   GENERAL: fatigued appearing female, NAD Lungs- diminished at the bases bilaterally  CARDIAC:  JVP elevated to jaw           RRR, No MGRs, 2+ b/l LEE   ABDOMEN: obese, distended. NT  EXTREMITIES: Warm and well perfused. + RUE PICC  NEUROLOGIC: + asterixis on exam   Telemetry   NSR 80s, personally reviewed   EKG    N/A   Labs   Basic Metabolic  Panel: Recent Labs  Lab 11/23/23 0500 11/24/23 0150 11/25/23 0554 11/26/23 0947 11/28/23 0600  NA 133* 132* 133* 133* 137  K 3.9 3.8 3.8 3.9 3.8  CL 96* 96* 97* 97* 99  CO2 25 25 25 24 25   GLUCOSE 106* 111* 94 122* 152*  BUN 30* 30* 30* 28* 29*  CREATININE 1.94* 1.93* 1.95* 1.90* 1.73*  CALCIUM  8.6* 8.5* 8.3* 8.4* 8.1*  MG  --  2.1  --   --  1.8    Liver Function Tests: Recent Labs  Lab 11/24/23 0150  AST 32  ALT 20  ALKPHOS 184*  BILITOT 2.3*  PROT 7.2  ALBUMIN 2.2*   No results for input(s): LIPASE, AMYLASE in the last 168 hours. No results for input(s): AMMONIA in the last 168 hours.  CBC: Recent Labs  Lab 11/23/23 0500 11/24/23 0150 11/25/23 0554 11/26/23 0536 11/27/23 0500  WBC 8.3 9.8 8.5 9.9 8.7  HGB 10.9* 10.3* 9.8* 10.2* 9.9*  HCT 34.6* 32.6* 31.2* 31.9* 31.0*  MCV 84.2 84.2 84.8 84.8 85.4  PLT 163 151 133* 125* 114*    Cardiac Enzymes: No results for input(s): CKTOTAL, CKMB, CKMBINDEX, TROPONINI in the last 168 hours.  BNP: BNP (last 3 results) Recent Labs    01/08/23 1118 02/11/23 1445 11/18/23 1745  BNP 1,482.0* 1,213* 2,302.1*    ProBNP (last 3 results) Recent Labs    11/16/23 1201  PROBNP 14,798.0*     CBG: Recent Labs  Lab 11/28/23 0740 11/28/23 1128 11/28/23 1715 11/28/23 2031 11/29/23 0731  GLUCAP 141* 143* 136* 129* 106*    Coagulation Studies: No results for input(s): LABPROT, INR in the last 72 hours.   Imaging   No results found.   Medications:     Current Medications:  apixaban   5 mg Oral BID   arformoterol  15 mcg Nebulization BID   And   umeclidinium bromide  1 puff Inhalation Daily   vitamin C   500 mg Oral Daily   atorvastatin   80 mg Oral Daily   bisoprolol   5 mg Oral Daily   Chlorhexidine  Gluconate Cloth  6 each Topical Daily   cycloSPORINE  1 drop Both Eyes BID   DULoxetine   60 mg Oral BID   insulin  aspart  0-15 Units Subcutaneous TID WC   insulin  aspart  0-5 Units  Subcutaneous QHS   levothyroxine   100 mcg Oral QAC breakfast   loratadine   10 mg Oral Daily   pantoprazole   40 mg Oral Daily   potassium chloride   40 mEq Oral Daily   pregabalin   75 mg Oral BID   sodium chloride  flush  10-40 mL Intracatheter Q12H   sodium chloride  flush  3 mL Intravenous Q12H   traZODone   150 mg Oral QHS   ursodiol  300 mg Oral BID    Infusions:  furosemide  120 mg (11/28/23 2154)   milrinone 0.25 mcg/kg/min (11/28/23 2122)      Patient Profile   73 y/o female w/ chronic systolic heart failure w/ prominent RV dysfunction, hypertension, CKD IIIb, T2DM, OSA and h/o LE DVT admitted w/ a/c CHF w/ marked volume overload. Further w/u w/ RHC demonstrated severe RV dominant/ low output heart failure, felt to be end-stage. Now on milrinone to assist w/ palliative diuresis.   Assessment/Plan   1. Acute on Chronic Biventricular Heart Failure/RV Dominant  - Echo EF 35-40%, severe RV enlargment w/ mildly reduced systolic fx, estimated RVSP 32 mmHg. Mild MR/TR - RHC RA 27, PA 48/26, PCW 22, PVR 2.2, TD CI 1.8, PAPi 0.81>>Likely end-stage physiology  - not candidate for transplant given age, obesity and other co morbidities - not candidate for LVAD given severity of RV failure - will continue Milrinone 0.25 mcg/kg/min for RV support and to assist w/ diuresis, check co-ox - increase diuretics. Give 160 mg IV bolus followed by lasix  gtt at 20/hr + give 5 mg of metolazone . CMP in process. Will follow and adjust K supp accordingly - stop bisoprolol  given low output  - will consider additional GDMT as renal fx permits - given asterixis on exam, will check ammonia level and LFTs  - palliative care team following. Pt appears to have good understanding regarding severity of her illness and understands overall prognosis is poor    2. AKI on CKD IIIb - admit SCr 1.3, c/w baseline - SCr up to 1.7 on yesterday's labs. F/u CMP pending  - suspect cardiorenal/low output  - continue  milrinone support - aggressive adjustment of diuretics to help w/ renal venous decongestion   3. CAD  - coronary  artery classifications by CT imaging - no plans currently for LHC given renal dysfunction and absence of ischemic CP  - continue statin  - no ASA given apixaban  use   4. HTN - controlled   5. H/o recurrent LE DVT - on apxiaban   6. Anemia/ Thrombocytopenia  - Hgb 9.1. no gross bleeding  - suspect anemia of chronic disease d/t renal/hepatic dysfunction  - check iron  studies    Length of Stay: [address] PA-C  11/29/2023, 9:19 AM    Advanced Heart Failure Team Pager [phone] (M-F; 7a - 5p)  Please contact [facility] for night-coverage after hours (4p -7a ) and weekends on [email or web address]

## 2023-11-29 NOTE — Progress Notes (Signed)
 NH3 level elevated at 56 but pt reports ongoing diarrhea today. Hold off on adding lactulose. Will continue to follow levels.

## 2023-11-29 NOTE — Progress Notes (Signed)
 [facility]  PROGRESS NOTE  Stephanie [name] DOB: 10-05-1950 DOA: 11/16/2023 PCP: Roni, [facility]   Brief HPI:   73 y.o. female with medical history significant of systolic heart failure, hypothyroidism, hyperlipidemia, COPD/asthma, history of DVT, fibromyalgia, GERD and type 2 diabetes mellitus with nephropathy; who presented to the hospital secondary to increased lower extremity swelling, shortness of breath with activity/orthopnea and weight gain.  Patient reports symptom has been present for the last 3 to 4 days and worsening; she was scheduled to be seen by cardiology service as an outpatient but unfortunately due to worsening of her symptoms did not make it and presented to the emergency department instead.     Of note, patient reports to be compliant with her medications.   ED:  Demonstrating elevated BNP, signs of fluid overload on physical exam and a chest x-ray with vascular congestion/interstitial edema.  IV Lasix  was started      Assessment/Plan:   Acute on chronic systolic heart failure -Bilaterally heart failure, EF 35 to 40% -Repeat 2d echo with EF 35-40% with regional WMA, mildly reduced RV function and severely enlarged RV size  - Presenting with complaint of weight gain, orthopnea, lower extremity swelling and elevated BNP -presenting BNP 14,798.0 - Cardiology following, Lasix  dose increased to 120 mg 3 times daily - Continue  Bisoprolol , Entresto  stopped due to worsening creatinine -continued on heparin  gtt. plan was to get LHC in the hospital however, currently on hold due to worsening CHF and renal insufficiency -Plan for outpatient cardiac cath as per Dr. Ladona - Underwent right heart cath, patient has end-stage biventricular failure.  Recommends palliative consult due to poor functional status and comorbidities.  Started on milrinone per cardiology - Palliative care consultation obtained for clarification of goals of care - Heart failure team  following, continue milrinone, metolazone , Diamox.  Diarrhea/lower GI bleed -Developed diarrhea and also bloody bowel movements. -She has history of diverticulosis, seen on colonoscopy in 2017 -Discussed with [facility] -They will see patient in a.m. -Continue to monitor hemoglobin and hematocrit.  Transfuse for Hb less than 7    COPD/asthma - Appears to be stable and currently no signs of acute exacerbation - Continue bronchodilator management.   Type 2 diabetes with nephropathy - Update A1c - Checking CBG q. ACHS, SSI coverage -CBG well-controlled   Acute on Chronic kidney disease stage IIIb  - With a slightly increase in patient's creatinine from recent values most likely in the setting of decreased perfusion with CHF exacerbation -Creatinine has improved to 1.73    Hypertension - Stable    GERD - Continue PPI   Hypothyroidism - TSH: 6.73 -Checking free T3, T4 - Continue Synthroid .   Depression/anxiety/neuropathy and Fibromyalgia - Continue Cymbalta  and Atarax  - Continue Lyrica  and Robaxin  - Complained of increased pain this AM. Added PRN IV dilaudid  for breakthrough    History of DVT and prior CVA - No acute neurologic deficit - Heparin  discontinued.  Started on apixaban    Hyperlipidemia - Resume outpatient Repatha  after discharge       Medications     apixaban   5 mg Oral BID   arformoterol  15 mcg Nebulization BID   And   umeclidinium bromide  1 puff Inhalation Daily   vitamin C   500 mg Oral Daily   atorvastatin   80 mg Oral Daily   bisoprolol   5 mg Oral Daily   Chlorhexidine  Gluconate Cloth  6 each Topical Daily   cycloSPORINE  1 drop Both  Eyes BID   DULoxetine   60 mg Oral BID   insulin  aspart  0-15 Units Subcutaneous TID WC   insulin  aspart  0-5 Units Subcutaneous QHS   levothyroxine   100 mcg Oral QAC breakfast   loratadine   10 mg Oral Daily   pantoprazole   40 mg Oral Daily   potassium chloride   40 mEq Oral Daily   pregabalin   75 mg Oral BID    sodium chloride  flush  10-40 mL Intracatheter Q12H   sodium chloride  flush  3 mL Intravenous Q12H   traZODone   150 mg Oral QHS   ursodiol  300 mg Oral BID     Data Reviewed:   CBG:  Recent Labs  Lab 11/28/23 0740 11/28/23 1128 11/28/23 1715 11/28/23 2031 11/29/23 0731  GLUCAP 141* 143* 136* 129* 106*    SpO2: 97 % O2 Flow Rate (L/min): 2 L/min FiO2 (%): 28 %    Vitals:   11/28/23 2041 11/29/23 0005 11/29/23 0619 11/29/23 0734  BP:  (!) 83/66 113/70 (!) 135/113  Pulse:  88 87   Resp:  17 20 18   Temp:  97.9 F (36.6 C) 98 F (36.7 C) 97.6 F (36.4 C)  TempSrc:  Oral Oral Oral  SpO2: 95% 100% 97%   Weight:   110.1 kg   Height:          Data Reviewed:  Basic Metabolic Panel: Recent Labs  Lab 11/23/23 0500 11/24/23 0150 11/25/23 0554 11/26/23 0947 11/28/23 0600  NA 133* 132* 133* 133* 137  K 3.9 3.8 3.8 3.9 3.8  CL 96* 96* 97* 97* 99  CO2 25 25 25 24 25   GLUCOSE 106* 111* 94 122* 152*  BUN 30* 30* 30* 28* 29*  CREATININE 1.94* 1.93* 1.95* 1.90* 1.73*  CALCIUM  8.6* 8.5* 8.3* 8.4* 8.1*  MG  --  2.1  --   --  1.8    CBC: Recent Labs  Lab 11/23/23 0500 11/24/23 0150 11/25/23 0554 11/26/23 0536 11/27/23 0500  WBC 8.3 9.8 8.5 9.9 8.7  HGB 10.9* 10.3* 9.8* 10.2* 9.9*  HCT 34.6* 32.6* 31.2* 31.9* 31.0*  MCV 84.2 84.2 84.8 84.8 85.4  PLT 163 151 133* 125* 114*    LFT Recent Labs  Lab 11/24/23 0150  AST 32  ALT 20  ALKPHOS 184*  BILITOT 2.3*  PROT 7.2  ALBUMIN 2.2*     Antibiotics: Anti-infectives (From admission, onward)    None        DVT prophylaxis: Heparin   Code Status: Full code  Family Communication: No family at bedside   CONSULTS cardiology   Subjective   Patient seen and examined, had 2 episodes of bloody bowel movements.  1 episode yesterday and 1 today  Objective    Physical Examination:  General-appears in no acute distress Heart-S1-S2, regular, no murmur auscultated Lungs-clear to auscultation  bilaterally, no wheezing or crackles auscultated Abdomen-soft, nontender, no organomegaly Extremities trace edema in the lower extremities Neuro-alert, oriented x3, no focal deficit noted  Status is: Inpatient:             Stephanie [name]   [facility] If 7PM-7AM, please contact night-coverage at [email or web address], Office  [phone]   11/29/2023, 7:42 AM  LOS: 13 days

## 2023-11-29 NOTE — Plan of Care (Signed)

## 2023-11-29 NOTE — Progress Notes (Signed)
 Patient states that her right leg is very painful. She states that this just started about two days ago and she is concerned because it it very hard to move it now

## 2023-11-29 NOTE — TOC Progression Note (Signed)
 Transition of Care [facility]) - Progression Note    Patient Details  Name: Stephanie [name] MRN: [id number] Date of Birth: 04-Mar-1950  [facility] CM/SW Contact  Isaiah Public, LCSWA Phone Number: 11/29/2023, 12:57 PM  Clinical Narrative:     This patient has SNF bed at [facility]. CSW following to start insurance authorization for patient closer to patient being medically ready for dc. CSW will continue to follow.   Expected Discharge Plan:  (TBD) Barriers to Discharge: Continued Medical Work up               Expected Discharge Plan and Services In-house Referral: Clinical Social Work     Living arrangements for the past 2 months: Single Family Home                                       Social Drivers of Health (SDOH) Interventions SDOH Screenings   Food Insecurity: No Food Insecurity (11/17/2023)  Housing: Low Risk  (11/17/2023)  Transportation Needs: No Transportation Needs (11/17/2023)  Utilities: Not At Risk (11/17/2023)  Depression (PHQ2-9): Low Risk  (09/14/2023)  Social Connections: Moderately Isolated (11/17/2023)  Tobacco Use: Medium Risk (11/16/2023)    Readmission Risk Interventions     No data to display

## 2023-11-30 ENCOUNTER — Telehealth (HOSPITAL_COMMUNITY): Payer: Self-pay | Admitting: Pharmacy Technician

## 2023-11-30 ENCOUNTER — Other Ambulatory Visit (HOSPITAL_COMMUNITY): Payer: Self-pay

## 2023-11-30 DIAGNOSIS — K5909 Other constipation: Secondary | ICD-10-CM | POA: Diagnosis not present

## 2023-11-30 DIAGNOSIS — I2510 Atherosclerotic heart disease of native coronary artery without angina pectoris: Secondary | ICD-10-CM | POA: Diagnosis not present

## 2023-11-30 DIAGNOSIS — I5082 Biventricular heart failure: Secondary | ICD-10-CM | POA: Diagnosis not present

## 2023-11-30 DIAGNOSIS — K219 Gastro-esophageal reflux disease without esophagitis: Secondary | ICD-10-CM | POA: Diagnosis not present

## 2023-11-30 DIAGNOSIS — N1832 Chronic kidney disease, stage 3b: Secondary | ICD-10-CM | POA: Diagnosis not present

## 2023-11-30 DIAGNOSIS — K625 Hemorrhage of anus and rectum: Secondary | ICD-10-CM | POA: Diagnosis not present

## 2023-11-30 DIAGNOSIS — N179 Acute kidney failure, unspecified: Secondary | ICD-10-CM | POA: Diagnosis not present

## 2023-11-30 DIAGNOSIS — I5023 Acute on chronic systolic (congestive) heart failure: Secondary | ICD-10-CM | POA: Diagnosis not present

## 2023-11-30 LAB — POCT I-STAT 7, (LYTES, BLD GAS, ICA,H+H)
Acid-Base Excess: 1 mmol/L (ref 0.0–2.0)
Acid-Base Excess: 2 mmol/L (ref 0.0–2.0)
Bicarbonate: 26 mmol/L (ref 20.0–28.0)
Bicarbonate: 27 mmol/L (ref 20.0–28.0)
Calcium, Ion: 1.04 mmol/L — ABNORMAL LOW (ref 1.15–1.40)
Calcium, Ion: 1.13 mmol/L — ABNORMAL LOW (ref 1.15–1.40)
HCT: 33 % — ABNORMAL LOW (ref 36.0–46.0)
HCT: 35 % — ABNORMAL LOW (ref 36.0–46.0)
Hemoglobin: 11.2 g/dL — ABNORMAL LOW (ref 12.0–15.0)
Hemoglobin: 11.9 g/dL — ABNORMAL LOW (ref 12.0–15.0)
O2 Saturation: 62 %
O2 Saturation: 64 %
Potassium: 3.8 mmol/L (ref 3.5–5.1)
Potassium: 4.1 mmol/L (ref 3.5–5.1)
Sodium: 137 mmol/L (ref 135–145)
Sodium: 139 mmol/L (ref 135–145)
TCO2: 27 mmol/L (ref 22–32)
TCO2: 28 mmol/L (ref 22–32)
pCO2 arterial: 42.8 mmHg (ref 32–48)
pCO2 arterial: 43.2 mmHg (ref 32–48)
pH, Arterial: 7.391 (ref 7.35–7.45)
pH, Arterial: 7.405 (ref 7.35–7.45)
pO2, Arterial: 32 mmHg — CL (ref 83–108)
pO2, Arterial: 34 mmHg — CL (ref 83–108)

## 2023-11-30 LAB — CBC WITH DIFFERENTIAL/PLATELET
Abs Immature Granulocytes: 0.03 K/uL (ref 0.00–0.07)
Basophils Absolute: 0 K/uL (ref 0.0–0.1)
Basophils Relative: 0 %
Eosinophils Absolute: 0.2 K/uL (ref 0.0–0.5)
Eosinophils Relative: 3 %
HCT: 26.2 % — ABNORMAL LOW (ref 36.0–46.0)
Hemoglobin: 8.3 g/dL — ABNORMAL LOW (ref 12.0–15.0)
Immature Granulocytes: 0 %
Lymphocytes Relative: 14 %
Lymphs Abs: 1.2 K/uL (ref 0.7–4.0)
MCH: 26.7 pg (ref 26.0–34.0)
MCHC: 31.7 g/dL (ref 30.0–36.0)
MCV: 84.2 fL (ref 80.0–100.0)
Monocytes Absolute: 0.9 K/uL (ref 0.1–1.0)
Monocytes Relative: 11 %
Neutro Abs: 5.7 K/uL (ref 1.7–7.7)
Neutrophils Relative %: 72 %
Platelets: 104 K/uL — ABNORMAL LOW (ref 150–400)
RBC: 3.11 MIL/uL — ABNORMAL LOW (ref 3.87–5.11)
RDW: 15.5 % (ref 11.5–15.5)
WBC: 8.1 K/uL (ref 4.0–10.5)
nRBC: 0.4 % — ABNORMAL HIGH (ref 0.0–0.2)

## 2023-11-30 LAB — GLUCOSE, CAPILLARY
Glucose-Capillary: 211 mg/dL — ABNORMAL HIGH (ref 70–99)
Glucose-Capillary: 129 mg/dL — ABNORMAL HIGH (ref 70–99)
Glucose-Capillary: 179 mg/dL — ABNORMAL HIGH (ref 70–99)
Glucose-Capillary: 192 mg/dL — ABNORMAL HIGH (ref 70–99)

## 2023-11-30 LAB — BASIC METABOLIC PANEL WITH GFR
Anion gap: 10 (ref 5–15)
Anion gap: 9 (ref 5–15)
BUN: 29 mg/dL — ABNORMAL HIGH (ref 8–23)
BUN: 29 mg/dL — ABNORMAL HIGH (ref 8–23)
CO2: 25 mmol/L (ref 22–32)
CO2: 24 mmol/L (ref 22–32)
Calcium: 8.1 mg/dL — ABNORMAL LOW (ref 8.9–10.3)
Calcium: 8.4 mg/dL — ABNORMAL LOW (ref 8.9–10.3)
Chloride: 97 mmol/L — ABNORMAL LOW (ref 98–111)
Chloride: 98 mmol/L (ref 98–111)
Creatinine, Ser: 1.78 mg/dL — ABNORMAL HIGH (ref 0.44–1.00)
Creatinine, Ser: 1.89 mg/dL — ABNORMAL HIGH (ref 0.44–1.00)
GFR, Estimated: 30 mL/min — ABNORMAL LOW (ref >60)
GFR, Estimated: 28 mL/min — ABNORMAL LOW (ref >60)
Glucose, Bld: 200 mg/dL — ABNORMAL HIGH (ref 70–99)
Glucose, Bld: 196 mg/dL — ABNORMAL HIGH (ref 70–99)
Potassium: 3.5 mmol/L (ref 3.5–5.1)
Potassium: 3.8 mmol/L (ref 3.5–5.1)
Sodium: 132 mmol/L — ABNORMAL LOW (ref 135–145)
Sodium: 131 mmol/L — ABNORMAL LOW (ref 135–145)

## 2023-11-30 LAB — COOXEMETRY PANEL
Carboxyhemoglobin: 2.7 % — ABNORMAL HIGH (ref 0.5–1.5)
Methemoglobin: <0.7 % (ref 0.0–1.5)
O2 Saturation: 81.8 %
Total hemoglobin: 8.9 g/dL — ABNORMAL LOW (ref 12.0–16.0)

## 2023-11-30 LAB — URIC ACID: Uric Acid, Serum: 11.8 mg/dL — ABNORMAL HIGH (ref 2.5–7.1)

## 2023-11-30 LAB — MAGNESIUM: Magnesium: 1.9 mg/dL (ref 1.7–2.4)

## 2023-11-30 LAB — PROTIME-INR
INR: 1.8 — ABNORMAL HIGH (ref 0.8–1.2)
Prothrombin Time: 21.4 s — ABNORMAL HIGH (ref 11.4–15.2)

## 2023-11-30 MED ORDER — RIFAXIMIN 550 MG PO TABS
550.0000 mg | ORAL_TABLET | Freq: Two times a day (BID) | ORAL | Status: DC
  Administered 2023-11-30 – 2023-12-03 (×7): 550 mg via ORAL
  Filled 2023-11-30 (×7): qty 1

## 2023-11-30 MED ORDER — HYDROCORTISONE ACETATE 25 MG RE SUPP
25.0000 mg | Freq: Two times a day (BID) | RECTAL | Status: AC
Start: 2023-11-30 — End: 2023-12-02
  Administered 2023-11-30 – 2023-12-01 (×3): 25 mg via RECTAL
  Filled 2023-11-30 (×4): qty 1

## 2023-11-30 MED ORDER — POTASSIUM CHLORIDE CRYS ER 20 MEQ PO TBCR
40.0000 meq | EXTENDED_RELEASE_TABLET | ORAL | Status: AC
  Administered 2023-11-30 (×2): 40 meq via ORAL
  Filled 2023-11-30 (×2): qty 2

## 2023-11-30 MED ORDER — PREDNISONE 20 MG PO TABS
40.0000 mg | ORAL_TABLET | Freq: Every day | ORAL | Status: DC
  Administered 2023-11-30 – 2023-12-02 (×3): 40 mg via ORAL
  Filled 2023-11-30 (×3): qty 2

## 2023-11-30 MED ORDER — POTASSIUM CHLORIDE CRYS ER 20 MEQ PO TBCR: 40.0000 meq | EXTENDED_RELEASE_TABLET | Freq: Once | ORAL | Status: DC

## 2023-11-30 MED ORDER — SPIRONOLACTONE 25 MG PO TABS
25.0000 mg | ORAL_TABLET | Freq: Every day | ORAL | Status: DC
  Administered 2023-11-30 – 2023-12-06 (×7): 25 mg via ORAL
  Filled 2023-11-30 (×7): qty 1

## 2023-11-30 MED ORDER — POTASSIUM CHLORIDE CRYS ER 20 MEQ PO TBCR: 40.0000 meq | EXTENDED_RELEASE_TABLET | Freq: Two times a day (BID) | ORAL | Status: DC

## 2023-11-30 MED ORDER — MAGNESIUM SULFATE 2 GM/50ML IV SOLN
2.0000 g | Freq: Once | INTRAVENOUS | Status: AC
  Administered 2023-11-30: 2 g via INTRAVENOUS
  Filled 2023-11-30: qty 50

## 2023-11-30 MED ORDER — ACETAZOLAMIDE 250 MG PO TABS
500.0000 mg | ORAL_TABLET | Freq: Two times a day (BID) | ORAL | Status: AC
  Administered 2023-11-30: 500 mg via ORAL
  Filled 2023-11-30: qty 2

## 2023-11-30 MED ORDER — METOLAZONE 5 MG PO TABS
5.0000 mg | ORAL_TABLET | Freq: Two times a day (BID) | ORAL | Status: AC
  Administered 2023-11-30 (×2): 5 mg via ORAL
  Filled 2023-11-30 (×2): qty 1

## 2023-11-30 NOTE — Progress Notes (Signed)
 Advanced Heart Failure Rounding Note  Cardiologist: Jayson Sierras, MD   Chief Complaint: Acute on chronic biventricular heart failure  Subjective:    Lethargic today but briefly wakes up and answers questions.  Only 500 cc UOP charted last 24 hrs, weight down 4 lb.    Objective:   Weight Range: 108 kg Body mass index is 36.19 kg/m.   Vital Signs:   Temp:  [97.3 F (36.3 C)-97.7 F (36.5 C)] 97.3 F (36.3 C) (10/14 0900) Pulse Rate:  [85-91] 91 (10/14 0900) Resp:  [14-20] 15 (10/14 0325) BP: (116-127)/(55-100) 120/73 (10/14 0900) SpO2:  [96 %-100 %] 100 % (10/14 0900) Weight:  [108 kg] 108 kg (10/14 0325) Last BM Date : 11/29/23  Weight change: Filed Weights   11/28/23 0439 11/29/23 0619 11/30/23 0325  Weight: 111.8 kg 110.1 kg 108 kg    Intake/Output:   Intake/Output Summary (Last 24 hours) at 11/30/2023 0919 Last data filed at 11/29/2023 1800 Gross per 24 hour  Intake 449.58 ml  Output 500 ml  Net -50.42 ml      Physical Exam    General:  Chronically ill appearing elderly female Cor: JVP to ear. Regular rate & rhythm. No murmurs. Lungs: Breathing nonlabored Abdomen: obese, + edema Extremities: 2-3+ edema to waist Neuro: Lethargic. + asterixis.   Telemetry   SR 80s   Labs    CBC Recent Labs    11/29/23 1201 11/30/23 0819  WBC 9.0 8.1  NEUTROABS 6.7 5.7  HGB 9.1* 8.3*  HCT 27.6* 26.2*  MCV 83.1 84.2  PLT 118* 104*   Basic Metabolic Panel Recent Labs    89/86/74 0500 11/29/23 1201 11/30/23 0819  NA 132* 132* 132*  K 3.7 4.0 3.5  CL 99 99 97*  CO2 24 21* 25  GLUCOSE 144* 140* 200*  BUN 29* 29* 29*  CREATININE 1.75* 1.76* 1.78*  CALCIUM  8.3* 8.2* 8.1*  MG 1.8  --  1.9   Liver Function Tests Recent Labs    11/29/23 1201  AST 34  ALT 20  ALKPHOS 196*  BILITOT 2.3*  PROT 7.8  ALBUMIN 2.4*   No results for input(s): LIPASE, AMYLASE in the last 72 hours. Cardiac Enzymes No results for input(s): CKTOTAL,  CKMB, CKMBINDEX, TROPONINI in the last 72 hours.  BNP: BNP (last 3 results) Recent Labs    01/08/23 1118 02/11/23 1445 11/18/23 1745  BNP 1,482.0* 1,213* 2,302.1*    ProBNP (last 3 results) Recent Labs    11/16/23 1201  PROBNP 14,798.0*     D-Dimer No results for input(s): DDIMER in the last 72 hours. Hemoglobin A1C No results for input(s): HGBA1C in the last 72 hours. Fasting Lipid Panel No results for input(s): CHOL, HDL, LDLCALC, TRIG, CHOLHDL, LDLDIRECT in the last 72 hours. Thyroid  Function Tests No results for input(s): TSH, T4TOTAL, T3FREE, THYROIDAB in the last 72 hours.  Invalid input(s): FREET3  Other results:   Imaging    US  EKG SITE RITE Result Date: 11/29/2023 If Site Rite image not attached, placement could not be confirmed due to current cardiac rhythm.    Medications:     Scheduled Medications:  acetaZOLAMIDE  500 mg Oral BID   apixaban   5 mg Oral BID   arformoterol  15 mcg Nebulization BID   And   umeclidinium bromide  1 puff Inhalation Daily   vitamin C   500 mg Oral Daily   atorvastatin   80 mg Oral Daily   Chlorhexidine  Gluconate Cloth  6  each Topical Daily   cycloSPORINE  1 drop Both Eyes BID   DULoxetine   60 mg Oral BID   insulin  aspart  0-15 Units Subcutaneous TID WC   insulin  aspart  0-5 Units Subcutaneous QHS   levothyroxine   100 mcg Oral QAC breakfast   loratadine   10 mg Oral Daily   pantoprazole   40 mg Oral Daily   potassium chloride   40 mEq Oral Daily   pregabalin   75 mg Oral BID   sodium chloride  flush  10-40 mL Intracatheter Q12H   sodium chloride  flush  3 mL Intravenous Q12H   traZODone   150 mg Oral QHS   ursodiol  300 mg Oral BID    Infusions:  furosemide  (LASIX ) 200 mg in dextrose  5 % 100 mL (2 mg/mL) infusion 20 mg/hr (11/30/23 0906)   milrinone 0.25 mcg/kg/min (11/30/23 0325)    PRN Medications: acetaminophen  **OR** acetaminophen , acetaminophen , alum & mag hydroxide-simeth,  hydrOXYzine , ipratropium-albuterol , lubiprostone , methocarbamol , ondansetron  **OR** ondansetron  (ZOFRAN ) IV, ondansetron  (ZOFRAN ) IV, sodium chloride  flush, sodium chloride  flush, traMADol     Patient Profile   73 y/o female w/ chronic systolic heart failure w/ prominent RV dysfunction, hypertension, CKD IIIb, T2DM, OSA and h/o LE DVT admitted w/ a/c CHF w/ marked volume overload. Further w/u w/ [facility] demonstrated severe RV dominant/ low output heart failure, felt to be end-stage. Now on milrinone to assist w/ palliative diuresis.   Assessment/Plan   1. Acute on Chronic Biventricular Heart Failure/RV Dominant  - Echo EF 35-40%, severe RV enlargment w/ mildly reduced systolic fx, estimated RVSP 32 mmHg. Mild MR/TR - RHC RA 27, PA 48/26, PCW 22, PVR 2.2, TD CI 1.8, PAPi 0.81>>Likely end-stage physiology  - not candidate for transplant given age, obesity and other co morbidities - not candidate for LVAD given severity of RV failure - will continue Milrinone 0.25 mcg/kg/min for RV support and to assist w/ diuresis, CO-OX 82% - Is/Os not accurate but weight continues to trend down. Continue lasix  gtt at 20/hr, give 5 mg metolazone  BID and diamox 500 BID. Supp K and Mag. BMET this afternoon. - stopped bisoprolol  given low output  - will consider additional GDMT as renal fx permits - palliative care team following. Prognosis is poor. Palliative Perfomance Scale 30%. Hospice Care would be appropriate at discharge.   2. AKI on CKD IIIb - admit SCr 1.3, c/w baseline - SCr currently 1.8, stable last few days - suspect cardiorenal/low output  - continue milrinone support - aggressive adjustment of diuretics to help w/ renal venous decongestion    3. CAD  - coronary artery classifications by CT imaging - no plans currently for LHC given renal dysfunction and absence of ischemic CP  - continue statin  - no ASA given apixaban  use    4. HTN - BP controlled    5. H/o recurrent LE DVT - on apxiaban     6. Anemia/ Thrombocytopenia  Lower GI bleeding - suspect anemia of chronic disease d/t renal/hepatic dysfunction  - hgb slowly drifting down - has been seen by GI, no plans for endoscopy - hold anticoagulation  7. Chronic liver disease - In setting of advanced biventricular heart failure - Asterixis on exam. Ammonia 56 on 10/13 - Starting rifaximin, did not add lactulose d/t loose stools  Length of Stay: 14  Dalyce Renne N, PA-C  11/30/2023, 9:19 AM  Advanced Heart Failure Team Pager [phone] (M-F; 7a - 5p)  Please contact [facility] for night-coverage after hours (5p -7a ) and weekends  on [email or web address]

## 2023-11-30 NOTE — Progress Notes (Signed)
 Orthopedic Tech Progress Note Patient Details:  Stephanie [name] 04/19/1950 [id number]  Went to apply UNNA BOOTS to patient when I got there she was on the bedside toilet, waited for RN to clean up, then the tech gave the patient her lunch. Will try again sometime later   patient [name], female   DOB: Jun 27, 1950, 73 y.o.   MRN: [id number]  Stephanie [name] Pac 11/30/2023, 12:49 PM

## 2023-11-30 NOTE — Telephone Encounter (Signed)
 Patient Product/process development scientist completed.    The patient is insured through [facility]. Patient has Medicare and is not eligible for a copay card, but may be able to apply for patient assistance or Medicare RX Payment Plan (Patient Must reach out to their plan, if eligible for payment plan), if available.    Ran test claim for Xifaxan 550 mg and Requires Prior Authorization   This test claim was processed through [employer]- copay amounts may vary at other pharmacies due to [employer], or as the patient moves through the different stages of their insurance plan.     Reyes Sharps, CPHT Pharmacy Technician Patient Advocate Specialist Lead [facility] Patient Advocate Team Direct Number: [phone]  Fax: [phone]

## 2023-11-30 NOTE — Progress Notes (Signed)
 [facility]  PROGRESS NOTE  Stephanie [name] FMW:[id number] DOB: Feb 01, 1951 DOA: 11/16/2023 PCP: Roni, [facility]   Brief HPI:   73 y.o. female with medical history significant of systolic heart failure, hypothyroidism, hyperlipidemia, COPD/asthma, history of DVT, fibromyalgia, GERD and type 2 diabetes mellitus with nephropathy; who presented to the hospital secondary to increased lower extremity swelling, shortness of breath with activity/orthopnea and weight gain.  Patient reports symptom has been present for the last 3 to 4 days and worsening; she was scheduled to be seen by cardiology service as an outpatient but unfortunately due to worsening of her symptoms did not make it and presented to the emergency department instead.     Of note, patient reports to be compliant with her medications.   ED:  Demonstrating elevated BNP, signs of fluid overload on physical exam and a chest x-ray with vascular congestion/interstitial edema.  IV Lasix  was started      Assessment/Plan:   Acute on chronic systolic heart failure -Bilaterally heart failure, EF 35 to 40% -Repeat 2d echo with EF 35-40% with regional WMA, mildly reduced RV function and severely enlarged RV size  - Presented with complaint of weight gain, orthopnea, lower extremity swelling and elevated BNP -presenting BNP 14,798.0 - Cardiology following, Lasix  dose increased to 120 mg 3 times daily - Continue  Bisoprolol , Entresto  stopped due to worsening creatinine -continued on heparin  gtt. plan was to get LHC in the hospital however, currently on hold due to worsening CHF and renal insufficiency -Plan for outpatient cardiac cath as per Dr. Ladona - Underwent right heart cath, patient has end-stage biventricular failure.  Recommends palliative consult due to poor functional status and comorbidities.  Started on milrinone per cardiology - Palliative care consultation obtained for clarification of goals of care - Heart failure team  following, continue milrinone, metolazone , Diamox.  Diarrhea/lower GI bleed -Developed diarrhea and also bloody bowel movements. -She has history of diverticulosis, seen on colonoscopy in 2017 -[facility] gastroenterology  consulted, likely bleeding from hemorrhoids.  Started on Anusol  suppository.  No endoscopic procedures planned -Continue to monitor hemoglobin and hematocrit.  Transfuse for Hb less than 7    COPD/asthma - Appears to be stable and currently no signs of acute exacerbation - Continue bronchodilator management.   Type 2 diabetes with nephropathy - Update A1c - Checking CBG q. ACHS, SSI coverage -CBG well-controlled   Acute on Chronic kidney disease stage IIIb  - With a slightly increase in patient's creatinine from recent values most likely in the setting of decreased perfusion with CHF exacerbation -Creatinine has improved to 1.76    Hypertension - Stable    GERD - Continue PPI   Hypothyroidism - TSH: 6.73 -Checking free T3, T4 - Continue Synthroid .   Depression/anxiety/neuropathy and Fibromyalgia - Continue Cymbalta  and Atarax  - Continue Lyrica  and Robaxin  - Complained of increased pain this AM. Added PRN IV dilaudid  for breakthrough    History of DVT and prior CVA - No acute neurologic deficit - Heparin  discontinued.  Started on apixaban    Hyperlipidemia - Resume outpatient Repatha  after discharge       Medications     acetaZOLAMIDE  500 mg Oral BID   apixaban   5 mg Oral BID   arformoterol  15 mcg Nebulization BID   And   umeclidinium bromide  1 puff Inhalation Daily   vitamin C   500 mg Oral Daily   atorvastatin   80 mg Oral Daily   Chlorhexidine  Gluconate Cloth  6 each Topical  Daily   cycloSPORINE  1 drop Both Eyes BID   DULoxetine   60 mg Oral BID   insulin  aspart  0-15 Units Subcutaneous TID WC   insulin  aspart  0-5 Units Subcutaneous QHS   levothyroxine   100 mcg Oral QAC breakfast   loratadine   10 mg Oral Daily   pantoprazole   40 mg Oral  Daily   potassium chloride   40 mEq Oral Daily   pregabalin   75 mg Oral BID   sodium chloride  flush  10-40 mL Intracatheter Q12H   sodium chloride  flush  3 mL Intravenous Q12H   traZODone   150 mg Oral QHS   ursodiol  300 mg Oral BID     Data Reviewed:   CBG:  Recent Labs  Lab 11/28/23 2031 11/29/23 0731 11/29/23 1140 11/29/23 1625 11/29/23 2104  GLUCAP 129* 106* 137* 136* 141*    SpO2: 96 % O2 Flow Rate (L/min): 2 L/min FiO2 (%): 28 %    Vitals:   11/29/23 1933 11/29/23 2058 11/29/23 2315 11/30/23 0325  BP: 124/63  (!) 127/100 118/62  Pulse: 87  88 87  Resp: 14  20 15   Temp: 97.7 F (36.5 C)  97.7 F (36.5 C) (!) 97.3 F (36.3 C)  TempSrc: Oral  Axillary Oral  SpO2: 100% 96%    Weight:    108 kg  Height:          Data Reviewed:  Basic Metabolic Panel: Recent Labs  Lab 11/24/23 0150 11/25/23 0554 11/26/23 0947 11/28/23 0600 11/29/23 0500 11/29/23 1201  NA 132* 133* 133* 137 132* 132*  K 3.8 3.8 3.9 3.8 3.7 4.0  CL 96* 97* 97* 99 99 99  CO2 25 25 24 25 24  21*  GLUCOSE 111* 94 122* 152* 144* 140*  BUN 30* 30* 28* 29* 29* 29*  CREATININE 1.93* 1.95* 1.90* 1.73* 1.75* 1.76*  CALCIUM  8.5* 8.3* 8.4* 8.1* 8.3* 8.2*  MG 2.1  --   --  1.8 1.8  --     CBC: Recent Labs  Lab 11/24/23 0150 11/25/23 0554 11/26/23 0536 11/27/23 0500 11/29/23 1201  WBC 9.8 8.5 9.9 8.7 9.0  NEUTROABS  --   --   --   --  6.7  HGB 10.3* 9.8* 10.2* 9.9* 9.1*  HCT 32.6* 31.2* 31.9* 31.0* 27.6*  MCV 84.2 84.8 84.8 85.4 83.1  PLT 151 133* 125* 114* 118*    LFT Recent Labs  Lab 11/24/23 0150 11/29/23 1201  AST 32 34  ALT 20 20  ALKPHOS 184* 196*  BILITOT 2.3* 2.3*  PROT 7.2 7.8  ALBUMIN 2.2* 2.4*     Antibiotics: Anti-infectives (From admission, onward)    None        DVT prophylaxis: Heparin   Code Status: Full code  Family Communication: No family at bedside   CONSULTS cardiology   Subjective   Patient seen and examined, denies further bloody  bowel movements.  Denies chest pain or shortness of breath  Objective    Physical Examination:   General-appears in no acute distress Heart-S1-S2, regular, no murmur auscultated Lungs-clear to auscultation bilaterally, no wheezing or crackles auscultated Abdomen-soft, nontender, no organomegaly Extremities-trace edema in the lower extremities Neuro-alert, oriented x3, no focal deficit noted Status is: Inpatient:             Sabas GORMAN Brod   [facility] If 7PM-7AM, please contact night-coverage at [email or web address], Office  [phone]   11/30/2023, 7:34 AM  LOS: 14 days

## 2023-11-30 NOTE — Progress Notes (Signed)
 Orthopedic Tech Progress Note Patient Details:  Stephanie [name] December 28, 1950 [id number]  Ortho Devices Type of Ortho Device: Ace wrap, Unna boot Ortho Device/Splint Location: BLE Ortho Device/Splint Interventions: Ordered, Application, Adjustment   Post Interventions Patient Tolerated: Well Instructions Provided: Care of device  Stephanie [name] Pac 11/30/2023, 3:09 PM

## 2023-11-30 NOTE — Progress Notes (Signed)
 Physical Therapy Treatment Patient Details Name: Stephanie [name]: [id number] DOB: 07/19/1950 Today's Date: 11/30/2023   History of Present Illness 73 y.o. female who presented 9/30 with lower extremity swelling, SOB, and weight gain. Dx acute on chronic systolic heart failure. PMH includes: systolic heart failure, hypothyroidism, hyperlipidemia, COPD/asthma, history of DVT, fibromyalgia, GERD and type 2 diabetes mellitus with nephropathy.    PT Comments  Pt agreeable to session with focus on mobility progression despite reports of pain this morning. Pt continues to need modA to complete bed mobility and up to modA of 2 for initial sit-stand transfer due to limited power in BLE. Pt limited to ~6 ft ambulation at this time due to pain and fatigue, will benefit from continued skilled PT to progress functional strength, endurance, and stability. Recommendations remain appropriate, will continue efforts acutely.    If plan is discharge home, recommend the following: A lot of help with bathing/dressing/bathroom;Assistance with cooking/housework;Assist for transportation;Help with stairs or ramp for entrance;Two people to help with walking and/or transfers   Can travel by private vehicle     No  Equipment Recommendations  [place] lift;Hospital bed    Recommendations for Other Services       Precautions / Restrictions Precautions Precautions: Fall Recall of Precautions/Restrictions: Intact Precaution/Restrictions Comments: significant edema Restrictions Weight Bearing Restrictions Per Provider Order: No     Mobility  Bed Mobility Overal bed mobility: Needs Assistance Bed Mobility: Supine to Sit     Supine to sit: Mod assist, HOB elevated     General bed mobility comments: pt able to move LE to EOB without assist, modA to elevate trunk and to scoot to EOB    Transfers Overall transfer level: Needs assistance Equipment used: Rolling walker (2 wheels) Transfers: Sit to/from Stand,  Bed to chair/wheelchair/BSC Sit to Stand: Mod assist, From elevated surface, +2 safety/equipment   Step pivot transfers: Min assist       General transfer comment: +2 for initial modA to rise, but then able to steady with +1 and BUE support and +2 arranged chair follow. limited power in LE, cues for UE placement on RW    Ambulation/Gait Ambulation/Gait assistance: Min assist, +2 safety/equipment Gait Distance (Feet): 6 Feet Assistive device: Rolling walker (2 wheels) Gait Pattern/deviations: Decreased stride length, Step-to pattern, Trunk flexed, Shuffle Gait velocity: decreased Gait velocity interpretation: <1.31 ft/sec, indicative of household ambulator   General Gait Details: pt with small steps, minimal clearance but able to manage RW without assist today. no overt LOB. VSS on 2L. unable to progress gait due to pt pain and fatigue   Stairs             Wheelchair Mobility     Tilt Bed    Modified Rankin (Stroke Patients Only)       Balance Overall balance assessment: Needs assistance Sitting-balance support: Feet supported, Bilateral upper extremity supported Sitting balance-Leahy Scale: Poor Sitting balance - Comments: able to maintain without UE support, not challenged dynamically   Standing balance support: Bilateral upper extremity supported Standing balance-Leahy Scale: Poor Standing balance comment: dependent on BUE support, no overt buckling or LOB                            Communication Communication Communication: No apparent difficulties  Cognition Arousal: Alert Behavior During Therapy: WFL for tasks assessed/performed   PT - Cognitive impairments: No apparent impairments  PT - Cognition Comments: pt with slightly flat affect, able to follow commands. not aware she had been given pain medications prior to PT arrival Following commands: Intact      Cueing Cueing Techniques: Verbal cues, Gestural  cues  Exercises      General Comments General comments (skin integrity, edema, etc.): VSS on 2L, Moderate dyspnea with minimal exertion.      Pertinent Vitals/Pain Pain Assessment Pain Assessment: 0-10 Pain Score: 6  Faces Pain Scale: Hurts even more Pain Location: low back and RLE Pain Descriptors / Indicators: Aching, Discomfort, Grimacing, Spasm Pain Intervention(s): Limited activity within patient's tolerance, Monitored during session, Premedicated before session, Repositioned     PT Goals (current goals can now be found in the care plan section) Acute Rehab PT Goals Patient Stated Goal: Get well PT Goal Formulation: With patient Time For Goal Achievement: 12/02/23 Potential to Achieve Goals: Good Progress towards PT goals: Progressing toward goals    Frequency    Min 2X/week       AM-PAC PT 6 Clicks Mobility   Outcome Measure  Help needed turning from your back to your side while in a flat bed without using bedrails?: A Little Help needed moving from lying on your back to sitting on the side of a flat bed without using bedrails?: A Lot Help needed moving to and from a bed to a chair (including a wheelchair)?: A Lot Help needed standing up from a chair using your arms (e.g., wheelchair or bedside chair)?: A Lot Help needed to walk in hospital room?: Total (<20 ft) Help needed climbing 3-5 steps with a railing? : Total 6 Click Score: 11    End of Session Equipment Utilized During Treatment: Oxygen ;Gait belt Activity Tolerance: Patient tolerated treatment well;Patient limited by pain Patient left: with call bell/phone within reach;in chair;with chair alarm set Nurse Communication: Mobility status PT Visit Diagnosis: Unsteadiness on feet (R26.81);Other abnormalities of gait and mobility (R26.89);Muscle weakness (generalized) (M62.81);Difficulty in walking, not elsewhere classified (R26.2)     Time: 8946-8881 PT Time Calculation (min) (ACUTE ONLY): 25  min  Charges:    $Gait Training: 8-22 mins $Therapeutic Exercise: 8-22 mins PT General Charges $$ ACUTE PT VISIT: 1 Visit                     Izetta Call, PT, DPT   Acute Rehabilitation Department Office [phone] Secure Chat Communication Preferred   Izetta JULIANNA Call 11/30/2023, 12:54 PM

## 2023-11-30 NOTE — Progress Notes (Signed)
 Right basilic PICC double lumen site noted bleeding with catheter exposed and coiled. STAT IV team consult ordered at 2234 for potential problem. Milrinone and furosemide  drips interrupted at this time per IV Team nurse recommendation after assessing PICC. Awaiting new line insertion and probable PICC exchange per Team. Juleen Distel, BSN, RN

## 2023-11-30 NOTE — Consult Note (Addendum)
 Consultation Note   Referring Provider:   [facility] PCP: Roni, [facility] Primary Gastroenterologist:   [facility]      Reason for Consultation:  Lower gastrointestinal bleeding DOA: 11/16/2023         Hospital Day: 15   Attending physician's note  I personally saw the patient and performed a substantive portion of the medical decision making process for this encounter (including a complete performance of the key components : MDM, Hx and Exam), in conjunction with the APP.  I agree with the APP's note, impression, and  the management plan for the number and complexity of problems addressed at the encounter for the patient and take responsibility for that plan with its inherent risk of complications, morbidity, or mortality with additional input as follows.    73 year old very pleasant female admitted with worsening heart failure, has acute on chronic biventricular heart failure on anticoagulation with heparin  gtt. and Eliquis , developed episodes of small-volume rectal bleeding Mild decline in hemoglobin She had a dark brown stool with no further bleeding today, possible etiology bleeding from internal hemorrhoids, cannot exclude diverticular hemorrhage but seems less likely given she is hemodynamically stable and no significant acute decline in hemoglobin, she has been having slow downtrend since hospitalization, fluctuation in volume status with CHF /hemodilution may also be playing a role.  Will plan for conservative management with Hydrocortisone  suppositories per rectum twice daily as needed for 5 to 7 days No plan for flex sig or colonoscopy  Chronic GERD with intermittent solid dysphagia, no plan for EGD at this point Continue daily PPI  Patient is extremely high risk, increased mortality and morbidity risk with anesthesia and invasive endoscopic procedures  Chronic liver disease, MASH, congestive hepatopathy,?   Autoimmune hepatitis On ursodiol, okay to continue   Palliative care following patient and recommended hospice evaluation  GI signing off, available if have any questions.  Please call GI back with any changes in clinical status if needed.     The patient was provided an opportunity to ask questions and all were answered. The patient agreed with the plan and demonstrated an understanding of the instructions.  LOIS Wilkie Mcgee , MD [phone]     ASSESSMENT    73 year old female admitted acute on chronic biventricular heart failure .  She has NYHA class IV symptoms despite high dose diuretics and IV milrinone. Palliative Care following and recommending Hospice evaluation.  Lower gastrointestinal bleeding, resolved Bleeding in the setting of Eliquis  / heparin .  She did have some associated lower abdominal discomfort which has also resolved.  Could have been a small volume diverticular bleed versus hemorrhoidal bleeding.  Stool now light brown without blood  Acute on chronic anemia ( in setting of GI bleed)  Hemoglobin 8.3 this a.m. down from baseline of 10-11.   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 Takes ursodiol   Chronic GERD  Hiatal Hernia Chronic intermittent solid food dysphagia, history of empiric esophageal dilations GERD symptoms controlled with daily PPI  Chronic constipation Takes Amitiza  at home as needed  History of DVT  CKD  DM2   Hypothyroidism  COPD  Additional GI Medical Problems:    See PMH for any additional  medical history  / medical problems  Principal Problem:   Heart failure, systolic, acute on chronic (HCC)   PLAN:   --Continue daily pantoprazole  --Trend H&H, should stabilize in the absence of ongoing bleeding --Empiric Anusol  suppositories twice daily --Given heart failure patient is at high for endoscopic procedures.  Hopefully she will not rebleed.  Right now her stools are on the loose side but  she does have a history of constipation so low threshold for resuming home Amitiza  once loose stools resolved.   HPI   Kortne has been followed by [facility] gastroenterology for history of MASH and also a positive ANA and a weakly positive ASMA.  Please refer to their 01/21/2022 office note for details of her chronic liver disease.   Milka was admitted to the hospital 15 days ago with acute on chronic heart failure.  She was severely volume overloaded.  Cardiology has been following inpatient.  Palliative Care following as well.    Hemoglobin 8.3 Ferritin 324 BUN 29 Creatinine 1.78  2 days ago patient had a bowel movement containing a large clot of dark red blood.  Yesterday she had another bowel movement with a smaller amount of red blood.  The bleeding was associated with some mild lower abdominal discomfort.  No rectal discomfort.  No nausea or vomiting.  She was not constipated.  In fact, stools have been on the looser side over the last couple days.  She takes Amitiza  at home as needed for constipation but has not been getting it in the hospital.  She does not have a history of rectal bleeding at home.  This a.m. nurse describes patient as being incontinent of a small amount of brown stool without any visible blood.  Patient has no other lower GI complaints.  She takes a daily PPI at home which controls her GERD symptoms.  However, she has intermittent dysphagia to solids and liquids.  This has been going on for years.  She gives a history of esophageal dilations, last one was approximately 5 years ago.   These tend to help but do not resolve the symptoms.     Pertinent GI Studies   July 2021 EGD for dysphagia Esophageal plaque versus residue adherent to the tubular esophagus status post Maloney dilation and KOH brushings.  Medium size hiatal hernia Examination otherwise normal Normal duodenal bulb and second portion of the duodenum  October 2017 colonoscopy for heme positive  stool Adequate bowel prep Diverticulosis in the entire colon Examination otherwise normal   Labs and Imaging:  Recent Labs    11/29/23 1201  PROT 7.8  ALBUMIN 2.4*  AST 34  ALT 20  ALKPHOS 196*  BILITOT 2.3*   Recent Labs    11/29/23 1201 11/30/23 0819  WBC 9.0 8.1  HGB 9.1* 8.3*  HCT 27.6* 26.2*  MCV 83.1 84.2  PLT 118* 104*   Recent Labs    11/28/23 0600 11/29/23 0500 11/29/23 1201  NA 137 132* 132*  K 3.8 3.7 4.0  CL 99 99 99  CO2 25 24 21*  GLUCOSE 152* 144* 140*  BUN 29* 29* 29*  CREATININE 1.73* 1.75* 1.76*  CALCIUM  8.1* 8.3* 8.2*     US  EKG SITE RITE If Site Rite image not attached, placement could not be confirmed due to  current cardiac rhythm.   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   Type 2 diabetes mellitus (HCC)     Past Surgical History:  Procedure Laterality Date   ABDOMINAL HYSTERECTOMY  partial   CARPAL TUNNEL RELEASE Right 1991   CATARACT EXTRACTION W/PHACO Right 05/08/2013   Procedure: CATARACT EXTRACTION PHACO AND INTRAOCULAR LENS PLACEMENT (IOC);  Surgeon: Cherene Mania, MD;  Location: [facility];  Service: Ophthalmology;  Laterality: Right;  CDE 10.31   CATARACT EXTRACTION W/PHACO Left 08/17/2013   Procedure: CATARACT EXTRACTION PHACO AND INTRAOCULAR LENS PLACEMENT (IOC);  Surgeon: Cherene Mania, MD;  Location: [facility];  Service: Ophthalmology;  Laterality: Left;  CDE:9.03   CHOLECYSTECTOMY  1971   COLONOSCOPY WITH PROPOFOL  N/A 01/06/2016   Dr. Shaaron: diverticulosis    DENTAL  SURGERY     ESOPHAGEAL BRUSHING  08/29/2019   Procedure: ESOPHAGEAL BRUSHING;  Surgeon: Shaaron Lamar HERO, MD;  Location: [facility];  Service: Endoscopy;;   ESOPHAGOGASTRODUODENOSCOPY (EGD) WITH PROPOFOL  N/A 01/06/2016   Dr. Shaaron: normal s/p empiric dilation    ESOPHAGOGASTRODUODENOSCOPY (EGD) WITH PROPOFOL  N/A 08/29/2019   esophageal plaques vs medication residue adherent to tubular esophagus s/p KOH brushing and dilation. Medium-sized hiatal hernia. + for candida. Diflucan .    EYE SURGERY     IR BONE MARROW BIOPSY & ASPIRATION  03/31/2022   MALONEY DILATION N/A 01/06/2016   Procedure: AGAPITO DILATION;  Surgeon: Lamar HERO Shaaron, MD;  Location: [facility];  Service: Endoscopy;  Laterality: N/A;   MALONEY DILATION N/A 08/29/2019   Procedure: AGAPITO DILATION;  Surgeon: Shaaron Lamar HERO, MD;  Location: [facility];  Service: Endoscopy;  Laterality: N/A;   REVERSE SHOULDER ARTHROPLASTY Right 08/06/2017   Procedure: RIGHT REVERSE SHOULDER ARTHROPLASTY;  Surgeon: Kay Kemps, MD;  Location: [facility] OR;  Service: Orthopedics;  Laterality: Right;   RIGHT HEART CATH N/A 11/26/2023   Procedure: RIGHT HEART CATH;  Surgeon: Cherrie Toribio SAUNDERS, MD;  Location: [facility];  Service: Cardiovascular;  Laterality: N/A;   WRIST GANGLION EXCISION Left     Family History  Problem Relation Age of Onset   Hypertension Mother    Diabetes Mother    COPD Mother    Arthritis Mother    Diabetes Father    Arthritis Father    Dementia Father    CAD Father    Hypothyroidism Sister    Diabetes Brother    Stroke Paternal Aunt    Colon cancer Niece    Colon polyps Neg Hx    Sleep apnea Neg Hx     Prior to Admission medications   Medication Sig Start Date End Date Taking? Authorizing Provider  albuterol  (PROVENTIL ) (2.5 MG/3ML) 0.083% nebulizer solution Take 2.5 mg by nebulization every 6 (six) hours as needed for wheezing or shortness of breath.   Yes [provider]  albuterol  (VENTOLIN   HFA) 108 (90 Base) MCG/ACT inhaler Inhale 1-2 puffs into the lungs every 6 (six) hours as needed for wheezing or shortness of breath. 03/21/21  Yes Stuart Vernell Norris, PA-C  Alcohol Swabs (ALCOHOL PADS) 70 % PADS SMARTSIG:Pledget(s) Topical 4 Times Daily 08/12/20  Yes [provider]  apixaban  (ELIQUIS ) 5 MG TABS tablet Take 1 tablet (5 mg total) by mouth 2 (two) times daily. 05/21/21  Yes Arrien, Mauricio Daniel, MD  Ascorbic Acid  (VITAMIN C ) 1000 MG tablet Take 500 mg by mouth daily.   Yes [provider]  atorvastatin  (LIPITOR ) 80 MG tablet Take 1 tablet (80 mg total) by mouth daily. 05/22/21 06/24/58 Yes Arrien, Elidia Sieving, MD  bisoprolol  (ZEBETA ) 10 MG tablet TAKE 1 TABLET(10 MG) BY MOUTH IN THE MORNING AND AT BEDTIME 06/08/23  Yes Strader, [place] M, PA-C  Cetirizine  HCl 10 MG CAPS Take 10 mg by mouth daily.   Yes [provider]  Cholecalciferol  (VITAMIN D ) 2000 units tablet Take 4,000 Units by mouth daily.  Patient taking differently: Take 2,000 Units by mouth daily.   Yes [provider]  cimetidine  (TAGAMET ) 200 MG tablet Take 0.5 tablets (100 mg total) by mouth daily as needed. 03/22/20  Yes Ricky Fines, MD  CINNAMON PO Take 1-2 capsules by mouth 3 (three) times daily.   Yes [provider]  clobetasol  ointment (TEMOVATE ) 0.05 % Apply 1 application  topically as needed (imflammation). 06/20/15  Yes [provider]  DULoxetine  (CYMBALTA ) 60 MG capsule Take 60 mg by mouth 2 (two) times daily.   Yes [provider]  Evolocumab  (REPATHA  SURECLICK) 140 MG/ML SOAJ Inject 140 mg into the skin every 14 (fourteen) days. 03/18/23  Yes Whitfield Raisin, NP  fluticasone  (FLONASE ) 50 MCG/ACT nasal spray SMARTSIG:2 Spray(s) Both Nares Daily PRN 04/05/23  Yes [provider]  guaiFENesin  (ROBITUSSIN) 100 MG/5ML SOLN Take 5 mLs (100 mg total) by mouth every 4 (four) hours as needed for cough or to loosen phlegm. Patient taking differently:  Take 5 mLs by mouth as needed for cough or to loosen phlegm. 03/17/20  Yes Shah, Pratik D, DO  hydrOXYzine  (ATARAX /VISTARIL ) 25 MG tablet Take 25 mg by mouth every 8 (eight) hours as needed for itching.  06/20/15  Yes [provider]  insulin  degludec (TRESIBA  FLEXTOUCH) 100 UNIT/ML FlexTouch Pen Inject 15 Units into the skin at bedtime. 06/14/23  Yes Therisa Benton PARAS, NP  KLAYESTA  powder APPLY TOPICALLY THREE TIMES DAILY 09/20/23  Yes Therisa Benton PARAS, NP  levothyroxine  (SYNTHROID ) 100 MCG tablet Take 1 tablet (100 mcg total) by mouth daily before breakfast. 10/21/23  Yes Reardon, Benton PARAS, NP  lidocaine  (LIDODERM ) 5 % Place 1 patch onto the skin daily. Remove & Discard patch within 12 hours or as directed by MD Patient taking differently: Place 1 patch onto the skin daily as needed. Remove & Discard patch within 12 hours or as directed by MD 01/29/22  Yes Leath-Warren, Etta PARAS, NP  lubiprostone  (AMITIZA ) 24 MCG capsule Take 1 capsule (24 mcg total) by mouth 2 (two) times daily with a meal. Patient taking differently: Take 24 mcg by mouth 2 (two) times daily as needed for constipation. 01/21/22  Yes Ezzard Sonny RAMAN, PA-C  LYRICA  75 MG capsule Take 75 mg by mouth 2 (two) times daily.  07/18/15  Yes [provider]  methocarbamol  (ROBAXIN ) 500 MG tablet Take 1 tablet (500 mg total) by mouth 3 (three) times daily as needed. Patient taking differently: Take 500 mg by mouth 3 (three) times daily as needed for muscle spasms. 08/06/17  Yes Kay Kemps, MD  pantoprazole  (PROTONIX ) 40 MG tablet Take 1 tablet (40 mg total) by mouth daily before breakfast. 01/21/22  Yes Ezzard Sonny RAMAN, PA-C  RESTASIS 0.05 % ophthalmic emulsion Place 1 drop  into both eyes 2 (two) times daily. 08/21/20  Yes [provider]  sacubitril-valsartan (ENTRESTO ) 24-26 MG Take 1 tablet by mouth 2 (two) times daily. 08/03/23  Yes [name] [name], MD  Simethicone 125 MG CAPS Take 1 capsule every day by oral route  as needed.   Yes [provider]  spironolactone (ALDACTONE) 25 MG tablet Take 25 mg by mouth daily.   Yes [provider]  Tiotropium Bromide-Olodaterol (STIOLTO RESPIMAT ) 2.5-2.5 MCG/ACT AERS Inhale 2 puffs into the lungs daily. 03/19/23  Yes Darlean Ozell NOVAK, MD  tirzepatide  (MOUNJARO ) 12.5 MG/0.5ML Pen Inject 12.5 mg into the skin once a week. 06/14/23  Yes Reardon, Benton PARAS, NP  torsemide  (DEMADEX ) 20 MG tablet Take 2 tablets (40 mg total) by mouth daily. Patient taking differently: Take 80 mg by mouth daily. 06/25/23  Yes [name] [name], MD  traMADol  (ULTRAM ) 50 MG tablet Take 50 mg by mouth 3 (three) times daily as needed for moderate pain. 01/07/18  Yes [provider]  traZODone  (DESYREL ) 150 MG tablet Take 150 mg by mouth at bedtime. 04/07/22  Yes [provider]  ursodiol (ACTIGALL) 300 MG capsule Take 300 mg by mouth 2 (two) times daily.   Yes [provider]  Accu-Chek FastClix Lancets MISC Use 1 lancet to check blood glucose four (4) times daily as directed by provider. 09/23/23   Therisa Benton PARAS, NP  ACCU-CHEK GUIDE test strip 4 (four) times daily. 07/02/20   [provider]  ARNUITY ELLIPTA  100 MCG/ACT AEPB Inhale 1 puff into the lungs daily. 04/11/23   [provider]  Blood Glucose Calibration (ACCU-CHEK GUIDE CONTROL) LIQD See admin instructions. 07/02/20   [provider]  Blood Glucose Monitoring Suppl (ACCU-CHEK GUIDE ME) w/Device KIT Use Glucose Meter to monitor blood glucose readings four(4) times daily as directed by provider. 09/23/23   Therisa Benton PARAS, NP  Continuous Glucose Receiver (FREESTYLE LIBRE 3 READER) DEVI USE TO MONITOR GLUCOSE CONTINUOUSLY AS DIRECTED 06/15/23   Therisa Benton PARAS, NP  Continuous Glucose Sensor (FREESTYLE LIBRE 3 PLUS SENSOR) MISC USE TO MONITOR GLUCOSE CONTINUOUSLY AS DIRECTED. CHANGE SENSOR EVERY 15 DAYS. 06/17/23   Therisa Benton PARAS, NP  EASY COMFORT PEN NEEDLES 31G X 5 MM  MISC INJECT IN SULIN EVERY DAY AS DIRECTED 06/02/21   [provider]  glucose blood (ACCU-CHEK GUIDE TEST) test strip Use 1 test strip four(4) times daily to monitor blood glucose as directed by provider. 09/23/23   Therisa Benton PARAS, NP  Lancet Devices (EASY MINI EJECT LANCING DEVICE) MISC 4 (four) times daily. 07/02/20   [provider]  Skin Protectants, Misc. (INTERDRY 89K855) SHEE Change pad daily or if soiled 10/21/23   Therisa Benton PARAS, NP    Current Facility-Administered Medications  Medication Dose Route Frequency Provider Last Rate Last Admin   acetaminophen  (TYLENOL ) tablet 650 mg  650 mg Oral Q6H PRN Bensimhon, Daniel R, MD   650 mg at 11/18/23 9175   Or   acetaminophen  (TYLENOL ) suppository 650 mg  650 mg Rectal Q6H PRN Bensimhon, Toribio SAUNDERS, MD       acetaminophen  (TYLENOL ) tablet 650 mg  650 mg Oral Q4H PRN Bensimhon, Toribio SAUNDERS, MD   650 mg at 11/30/23 0713   acetaZOLAMIDE (DIAMOX) tablet 500 mg  500 mg Oral BID Stoner, Benjamin J, MD   500 mg at 11/29/23 2235   alum & mag hydroxide-simeth (MAALOX/MYLANTA) 200-200-20 MG/5ML suspension 30 mL  30 mL Oral Q4H PRN Bensimhon,  Toribio SAUNDERS, MD   30 mL at 11/25/23 1442   apixaban  (ELIQUIS ) tablet 5 mg  5 mg Oral BID Nishan, Peter C, MD   5 mg at 11/29/23 2235   arformoterol (BROVANA) nebulizer solution 15 mcg  15 mcg Nebulization BID Bensimhon, Daniel R, MD   15 mcg at 11/30/23 9189   And   umeclidinium bromide (INCRUSE ELLIPTA) 62.5 MCG/ACT 1 puff  1 puff Inhalation Daily Bensimhon, Toribio SAUNDERS, MD   1 puff at 11/30/23 0810   ascorbic acid  (VITAMIN C ) tablet 500 mg  500 mg Oral Daily Bensimhon, Daniel R, MD   500 mg at 11/29/23 1109   atorvastatin  (LIPITOR ) tablet 80 mg  80 mg Oral Daily Bensimhon, Toribio SAUNDERS, MD   80 mg at 11/29/23 1109   Chlorhexidine  Gluconate Cloth 2 % PADS 6 each  6 each Topical Daily Bensimhon, Toribio SAUNDERS, MD   6 each at 11/29/23 1000   cycloSPORINE (RESTASIS) 0.05 % ophthalmic emulsion 1 drop  1 drop Both  Eyes BID Bensimhon, Toribio SAUNDERS, MD   1 drop at 11/29/23 2235   DULoxetine  (CYMBALTA ) DR capsule 60 mg  60 mg Oral BID Bensimhon, Daniel R, MD   60 mg at 11/29/23 2236   furosemide  (LASIX ) 200 mg in dextrose  5 % 100 mL (2 mg/mL) infusion  20 mg/hr Intravenous Continuous Marcine Catalan M, PA-C 10 mL/hr at 11/29/23 2245 20 mg/hr at 11/29/23 2245   hydrOXYzine  (ATARAX ) tablet 25 mg  25 mg Oral Q8H PRN Bensimhon, Daniel R, MD   25 mg at 11/28/23 2035   insulin  aspart (novoLOG ) injection 0-15 Units  0-15 Units Subcutaneous TID WC Bensimhon, Daniel R, MD   2 Units at 11/29/23 1705   insulin  aspart (novoLOG ) injection 0-5 Units  0-5 Units Subcutaneous QHS Bensimhon, Daniel R, MD   2 Units at 11/19/23 2253   ipratropium-albuterol  (DUONEB) 0.5-2.5 (3) MG/3ML nebulizer solution 3 mL  3 mL Nebulization Q6H PRN Bensimhon, Toribio SAUNDERS, MD       levothyroxine  (SYNTHROID ) tablet 100 mcg  100 mcg Oral QAC breakfast Bensimhon, Toribio SAUNDERS, MD   100 mcg at 11/30/23 9286   loratadine  (CLARITIN ) tablet 10 mg  10 mg Oral Daily Bensimhon, Daniel R, MD   10 mg at 11/29/23 1109   lubiprostone  (AMITIZA ) capsule 24 mcg  24 mcg Oral BID PRN Bensimhon, Daniel R, MD   24 mcg at 11/20/23 0531   methocarbamol  (ROBAXIN ) tablet 500 mg  500 mg Oral TID PRN Bensimhon, Toribio SAUNDERS, MD   500 mg at 11/29/23 2234   milrinone (PRIMACOR) 20 MG/100 ML (0.2 mg/mL) infusion  0.25 mcg/kg/min Intravenous Continuous [name] [name], MD 8.42 mL/hr at 11/30/23 0325 0.25 mcg/kg/min at 11/30/23 0325   ondansetron  (ZOFRAN ) tablet 4 mg  4 mg Oral Q6H PRN Bensimhon, Toribio SAUNDERS, MD       Or   ondansetron  (ZOFRAN ) injection 4 mg  4 mg Intravenous Q6H PRN Bensimhon, Toribio SAUNDERS, MD       ondansetron  (ZOFRAN ) injection 4 mg  4 mg Intravenous Q6H PRN Bensimhon, Toribio SAUNDERS, MD       pantoprazole  (PROTONIX ) EC tablet 40 mg  40 mg Oral Daily Bensimhon, Daniel R, MD   40 mg at 11/29/23 1109   potassium chloride  SA (KLOR-CON  M) CR tablet 40 mEq  40 mEq Oral Daily Bensimhon,  Toribio SAUNDERS, MD   40 mEq at 11/29/23 1109   pregabalin  (LYRICA ) capsule 75 mg  75 mg Oral BID Bensimhon, Toribio  R, MD   75 mg at 11/29/23 2234   sodium chloride  flush (NS) 0.9 % injection 10-40 mL  10-40 mL Intracatheter Q12H Bensimhon, Toribio SAUNDERS, MD   10 mL at 11/29/23 1114   sodium chloride  flush (NS) 0.9 % injection 10-40 mL  10-40 mL Intracatheter PRN Bensimhon, Toribio SAUNDERS, MD       sodium chloride  flush (NS) 0.9 % injection 3 mL  3 mL Intravenous Q12H Bensimhon, Toribio SAUNDERS, MD   3 mL at 11/29/23 2237   sodium chloride  flush (NS) 0.9 % injection 3 mL  3 mL Intravenous PRN Bensimhon, Toribio SAUNDERS, MD       traMADol  (ULTRAM ) tablet 50 mg  50 mg Oral Q8H PRN Bensimhon, Toribio SAUNDERS, MD   50 mg at 11/30/23 9286   traZODone  (DESYREL ) tablet 150 mg  150 mg Oral QHS Bensimhon, Toribio SAUNDERS, MD   150 mg at 11/29/23 2235   ursodiol (ACTIGALL) capsule 300 mg  300 mg Oral BID Bensimhon, Toribio SAUNDERS, MD   300 mg at 11/29/23 2234    Allergies as of 11/16/2023 - Review Complete 11/16/2023  Allergen Reaction Noted   Tetracyclines & related Anaphylaxis and Rash 09/19/2010   Banana Hives and Nausea And Vomiting 05/04/2013   Penicillins Rash and Other (See Comments) 09/19/2010    Social History   Socioeconomic History   Marital status: Divorced    Spouse name: Not on file   Number of children: Not on file   Years of education: Not on file   Highest education level: Not on file  Occupational History   Not on file  Tobacco Use   Smoking status: Former    Current packs/day: 0.00    Average packs/day: 0.3 packs/day for 30.0 years (7.5 ttl pk-yrs)    Types: Cigarettes    Start date: 02/16/1981    Quit date: 02/17/2011    Years since quitting: 12.7   Smokeless tobacco: Never  Vaping Use   Vaping status: Never Used  Substance and Sexual Activity   Alcohol use: Not Currently    Comment: rare   Drug use: No   Sexual activity: Not Currently    Birth control/protection: Surgical  Other Topics Concern   Not on file   Social History Narrative   Pt lives with daughter    Retired    Social Drivers of Corporate investment banker Strain: Not on file  Food Insecurity: No Food Insecurity (11/17/2023)   Hunger Vital Sign    Worried About Running Out of Food in the Last Year: Never true    Ran Out of Food in the Last Year: Never true  Transportation Needs: No Transportation Needs (11/17/2023)   PRAPARE - Administrator, Civil Service (Medical): No    Lack of Transportation (Non-Medical): No  Physical Activity: Not on file  Stress: Not on file  Social Connections: Moderately Isolated (11/17/2023)   Social Connection and Isolation Panel    Frequency of Communication with Friends and Family: More than three times a week    Frequency of Social Gatherings with Friends and Family: More than three times a week    Attends Religious Services: 1 to 4 times per year    Active Member of [employer] or Organizations: No    Attends Banker Meetings: Never    Marital Status: Divorced  Catering manager Violence: Not At Risk (11/17/2023)   Humiliation, Afraid, Rape, and Kick questionnaire    Fear of Current or Ex-Partner:  No    Emotionally Abused: No    Physically Abused: No    Sexually Abused: No     Code Status   Code Status: Full Code  Review of Systems: All systems reviewed and negative except where noted in HPI.  Physical Exam: Vital signs in last 24 hours: Temp:  [97.3 F (36.3 C)-97.7 F (36.5 C)] 97.3 F (36.3 C) (10/14 0325) Pulse Rate:  [85-88] 85 (10/14 0811) Resp:  [14-20] 15 (10/14 0325) BP: (116-127)/(55-100) 118/62 (10/14 0325) SpO2:  [96 %-100 %] 96 % (10/14 0811) Weight:  [891 kg] 108 kg (10/14 0325) Last BM Date : 11/29/23  General:  Pleasant female in NAD Psych:  Cooperative. Normal mood and affect Eyes: Pupils equal Ears:  Normal auditory acuity Nose: No deformity, discharge or lesions Neck:  Supple, no masses felt Lungs:  Clear to auscultation.  Heart:   Regular rate, regular rhythm.  Abdomen:  Soft, nondistended, nontender, active bowel sounds, no masses felt Rectal : No perianal lesions seen.  No masses felt on DRE.  Scant amount of light brown stool in vault.  No blood in vault  Msk: Symmetrical without gross deformities.  Neurologic:  Alert, oriented, grossly normal neurologically Skin:  Intact without significant lesions.    Intake/Output from previous day: 10/13 0701 - 10/14 0700 In: 449.6 [I.V.:404.2; IV Piggyback:45.4] Out: 500 [Urine:500] Intake/Output this shift:  No intake/output data recorded.   Vina Dasen, NP-C   11/30/2023, 8:52 AM

## 2023-11-30 NOTE — Progress Notes (Signed)
 Cardiac drips restarted via peripheral IV. Will continue to monitor sites

## 2023-12-01 ENCOUNTER — Other Ambulatory Visit: Payer: Self-pay

## 2023-12-01 ENCOUNTER — Inpatient Hospital Stay (HOSPITAL_COMMUNITY)

## 2023-12-01 DIAGNOSIS — E1122 Type 2 diabetes mellitus with diabetic chronic kidney disease: Secondary | ICD-10-CM

## 2023-12-01 DIAGNOSIS — J449 Chronic obstructive pulmonary disease, unspecified: Secondary | ICD-10-CM

## 2023-12-01 DIAGNOSIS — I4891 Unspecified atrial fibrillation: Secondary | ICD-10-CM

## 2023-12-01 DIAGNOSIS — I5023 Acute on chronic systolic (congestive) heart failure: Secondary | ICD-10-CM | POA: Diagnosis not present

## 2023-12-01 DIAGNOSIS — I5082 Biventricular heart failure: Secondary | ICD-10-CM | POA: Diagnosis not present

## 2023-12-01 DIAGNOSIS — N1832 Chronic kidney disease, stage 3b: Secondary | ICD-10-CM | POA: Diagnosis not present

## 2023-12-01 DIAGNOSIS — I509 Heart failure, unspecified: Secondary | ICD-10-CM | POA: Diagnosis not present

## 2023-12-01 DIAGNOSIS — N1831 Chronic kidney disease, stage 3a: Secondary | ICD-10-CM

## 2023-12-01 DIAGNOSIS — Z515 Encounter for palliative care: Secondary | ICD-10-CM | POA: Diagnosis not present

## 2023-12-01 DIAGNOSIS — Z8673 Personal history of transient ischemic attack (TIA), and cerebral infarction without residual deficits: Secondary | ICD-10-CM

## 2023-12-01 DIAGNOSIS — N179 Acute kidney failure, unspecified: Secondary | ICD-10-CM | POA: Diagnosis not present

## 2023-12-01 DIAGNOSIS — I10 Essential (primary) hypertension: Secondary | ICD-10-CM | POA: Diagnosis not present

## 2023-12-01 DIAGNOSIS — Z794 Long term (current) use of insulin: Secondary | ICD-10-CM

## 2023-12-01 LAB — BASIC METABOLIC PANEL WITH GFR
Anion gap: 10 (ref 5–15)
BUN: 32 mg/dL — ABNORMAL HIGH (ref 8–23)
CO2: 22 mmol/L (ref 22–32)
Calcium: 8.6 mg/dL — ABNORMAL LOW (ref 8.9–10.3)
Chloride: 99 mmol/L (ref 98–111)
Creatinine, Ser: 1.98 mg/dL — ABNORMAL HIGH (ref 0.44–1.00)
GFR, Estimated: 26 mL/min — ABNORMAL LOW (ref >60)
Glucose, Bld: 224 mg/dL — ABNORMAL HIGH (ref 70–99)
Potassium: 4.3 mmol/L (ref 3.5–5.1)
Sodium: 131 mmol/L — ABNORMAL LOW (ref 135–145)

## 2023-12-01 LAB — CBC
HCT: 26.7 % — ABNORMAL LOW (ref 36.0–46.0)
Hemoglobin: 8.5 g/dL — ABNORMAL LOW (ref 12.0–15.0)
MCH: 27 pg (ref 26.0–34.0)
MCHC: 31.8 g/dL (ref 30.0–36.0)
MCV: 84.8 fL (ref 80.0–100.0)
Platelets: 117 K/uL — ABNORMAL LOW (ref 150–400)
RBC: 3.15 MIL/uL — ABNORMAL LOW (ref 3.87–5.11)
RDW: 15.6 % — ABNORMAL HIGH (ref 11.5–15.5)
WBC: 8.7 K/uL (ref 4.0–10.5)
nRBC: 0 % (ref 0.0–0.2)

## 2023-12-01 LAB — GLUCOSE, CAPILLARY
Glucose-Capillary: 200 mg/dL — ABNORMAL HIGH (ref 70–99)
Glucose-Capillary: 199 mg/dL — ABNORMAL HIGH (ref 70–99)
Glucose-Capillary: 191 mg/dL — ABNORMAL HIGH (ref 70–99)
Glucose-Capillary: 246 mg/dL — ABNORMAL HIGH (ref 70–99)

## 2023-12-01 LAB — COOXEMETRY PANEL
Carboxyhemoglobin: 2.2 % — ABNORMAL HIGH (ref 0.5–1.5)
Methemoglobin: <0.7 % (ref 0.0–1.5)
O2 Saturation: 85.9 %
Total hemoglobin: 9 g/dL — ABNORMAL LOW (ref 12.0–16.0)

## 2023-12-01 LAB — MAGNESIUM: Magnesium: 2.1 mg/dL (ref 1.7–2.4)

## 2023-12-01 LAB — AMMONIA: Ammonia: 33 umol/L (ref 9–35)

## 2023-12-01 MED ORDER — METOLAZONE 5 MG PO TABS
5.0000 mg | ORAL_TABLET | Freq: Two times a day (BID) | ORAL | Status: AC
  Administered 2023-12-01 (×2): 5 mg via ORAL
  Filled 2023-12-01 (×2): qty 1

## 2023-12-01 MED ORDER — ACETAZOLAMIDE 250 MG PO TABS
500.0000 mg | ORAL_TABLET | Freq: Two times a day (BID) | ORAL | Status: AC
  Administered 2023-12-01 (×2): 500 mg via ORAL
  Filled 2023-12-01 (×2): qty 2

## 2023-12-01 NOTE — Plan of Care (Signed)
  Problem: Coping: Goal: Ability to adjust to condition or change in health will improve Outcome: Progressing   Problem: Fluid Volume: Goal: Ability to maintain a balanced intake and output will improve Outcome: Progressing   Problem: Nutritional: Goal: Maintenance of adequate nutrition will improve Outcome: Progressing Goal: Progress toward achieving an optimal weight will improve Outcome: Progressing   Problem: Skin Integrity: Goal: Risk for impaired skin integrity will decrease Outcome: Progressing

## 2023-12-01 NOTE — Progress Notes (Signed)
  Progress Note   Patient: Stephanie [name] FMW:[id number] DOB: December 01, 1950 DOA: 11/16/2023     15 DOS: the patient was seen and examined on 12/01/2023   Brief hospital course: Stephanie [name] was admitted to the hospital with the working diagnosis of heart failure exacerbation.   73 y.o. female with medical history significant of systolic heart failure, hypothyroidism, hyperlipidemia, COPD/asthma, history of DVT, fibromyalgia, GERD and type 2 diabetes mellitus with nephropathy; who presented to the hospital secondary to increased lower extremity edema, dyspnea on exertion, orthopnea and weight gain. Reported 3 to 4 days of worsening symptoms.      Demonstrating elevated BNP, signs of fluid overload on physical exam and a chest x-ray with vascular congestion/interstitial edema.  IV Lasix  was started  Echocardiograph with reduced LV systolic function and biventricular failure.  Right heart catheterization with severe RV failure, low output state, felt to be end stage. Placed on milrinone and furosemide  drip for palliative diuresis.  Developed lower gastrointestinal bleeding, diverticular or hemorrhoidal, conservative management recommended,    Assessment and Plan: * Heart failure, systolic, acute on chronic (HCC) Echocardiogram with reduced LV systolic function with EF 35 to 40%, mild dilatated LV cavity, RV systolic function with mild reduction, RV with severe enlargement, RVSP 31.8 mmHg.  LV akinetic anterior septum, mid infero septal segment and basal infero septal segment.   Urine output is 2,650 ml Systolic blood pressure 115 mmHg range  Sv02 85.9   Continue furosemide  drip at 20 mg/hr Milrinone 0.25 mcg/kg/min  Metolazone  5 mg x 2 (10/15)   Acetazolamide 500 mg bid   Hypertension Continue blood pressure monitoring   Chronic kidney disease, stage 3b (HCC) AKI Hyponatremia  Renal function with serum 1.98 with K at 4.3 and serum bicarbonate at 22 Na 131   Continue aggressive diuresis    COPD (chronic obstructive pulmonary disease) (HCC) No signs of acute exacerbation  Diabetes (HCC) Continue glucose cover and monitoring with insulin  sliding scale Positive hyperglycemia   Hypothyroidism Continue levothyroxine    GERD (gastroesophageal reflux disease) Continue with pantoprazole    Protein-calorie malnutrition, moderate Nutritional supplements  History of CVA (cerebrovascular accident) Continue blood pressure monitoring   Obesity Calculated BMI 31.9 consistent with obesity class 1        Subjective: patient continue to have dyspnea and edema   Physical Exam: Vitals:   12/01/23 0814 12/01/23 0815 12/01/23 1000 12/01/23 1624  BP:  113/74  (!) 115/100  Pulse:    94  Resp:  13  15  Temp:    (!) 97.4 F (36.3 C)  TempSrc:    Oral  SpO2: 100%   100%  Weight:   95.3 kg   Height:       Neurology awake and alert ENT with mild pallor Cardiovascular with S1 and S2 present and regular with no gallops, positive systolic murmur at the left lower sternal border Positive JVD Respiratory with bilateral rales with no wheezing  Abdomen with no distention  Lower extremity edema + unna boots in place   Data Reviewed:    Family Communication: no family at the bedside   Disposition: Status is: Inpatient Remains inpatient appropriate because: diuresis   Planned Discharge Destination: Home     Author: Elidia Toribio Furnace, MD 12/01/2023 7:42 PM  For on call review [email or web address].

## 2023-12-01 NOTE — Assessment & Plan Note (Signed)
Calculated BMI 31.9 consistent with obesity class 1

## 2023-12-01 NOTE — Assessment & Plan Note (Signed)
 Continue losartan  for blood pressure control.

## 2023-12-01 NOTE — Assessment & Plan Note (Signed)
 Continue with pantoprazole 

## 2023-12-01 NOTE — Plan of Care (Signed)
  Problem: Fluid Volume: Goal: Ability to maintain a balanced intake and output will improve Outcome: Progressing   Problem: Metabolic: Goal: Ability to maintain appropriate glucose levels will improve Outcome: Progressing   Problem: Tissue Perfusion: Goal: Adequacy of tissue perfusion will improve Outcome: Progressing   Problem: Pain Managment: Goal: General experience of comfort will improve and/or be controlled Outcome: Progressing   Problem: Cardiac: Goal: Ability to achieve and maintain adequate cardiopulmonary perfusion will improve Outcome: Progressing

## 2023-12-01 NOTE — Assessment & Plan Note (Addendum)
 Echocardiogram with reduced LV systolic function with EF 35 to 40%, mild dilatated LV cavity, RV systolic function with mild reduction, RV with severe enlargement, RVSP 31.8 mmHg.  LV akinetic anterior septum, mid infero septal segment and basal infero septal segment.   Urine output is 2,600 ml Systolic blood pressure 110 mmHg range  Sv02 75.2   Off milrinone and transitioned to oral loop diuretic  Metolazone  5 mg x 2 (10/15)   Acetazolamide 500 mg bid  x2 (10/15)  Spironolactone 25 mg po daily.  Limited medical therapy due to risk of hypotension.  Poor prognosis.

## 2023-12-01 NOTE — Assessment & Plan Note (Signed)
 Nutritional supplements

## 2023-12-01 NOTE — Assessment & Plan Note (Addendum)
 Positive hyperglycemia has resolved.  discontinued insulin  therapy.    Continue statin therapy

## 2023-12-01 NOTE — Inpatient Diabetes Management (Signed)
 Inpatient Diabetes Program Recommendations  AACE/ADA: New Consensus Statement on Inpatient Glycemic Control   Target Ranges:  Prepandial:   less than 140 mg/dL      Peak postprandial:   less than 180 mg/dL (1-2 hours)      Critically ill patients:  140 - 180 mg/dL    Latest Reference Range & Units 11/30/23 09:10 11/30/23 11:26 11/30/23 17:37 11/30/23 21:30 12/01/23 08:18  Glucose-Capillary 70 - 99 mg/dL 788 (H) 870 (H) 820 (H) 192 (H) 200 (H)   Review of Glycemic Control  Diabetes history: DM2 Outpatient Diabetes medications: Tresiba  15 units at bedtime, Mounjaro  12.5 mg Qweek Current orders for Inpatient glycemic control: Novolog  0-15 units TID with meals, Novolog  0-5 units QHS  Inpatient Diabetes Program Recommendations:    Insulin : If steroids are continued as ordered,may want to consider ordering Lantus  4 units Q24H.  Thanks, Earnie Gainer, RN, MSN, CDCES Diabetes Coordinator Inpatient Diabetes Program [phone] (Team Pager from 8am to 5pm)

## 2023-12-01 NOTE — Progress Notes (Signed)
 This chaplain responded to PMT NP-Erwin's consult for creating the Pt. Advance Directive. The Pt. is participating in a sterile procedure at the time of the visit. The chaplain will plan a revisit.  Chaplain Leeroy hummer [phone]

## 2023-12-01 NOTE — Progress Notes (Signed)
 Occupational Therapy Treatment Patient Details Name: SHAGUN WORDELL MRN: [id number] DOB: 1950/03/28 Today's Date: 12/01/2023   History of present illness 73 y.o. female who presented 9/30 with lower extremity swelling, SOB, and weight gain. Dx acute on chronic systolic heart failure. PMH includes: systolic heart failure, hypothyroidism, hyperlipidemia, COPD/asthma, history of DVT, fibromyalgia, GERD and type 2 diabetes mellitus with nephropathy.   OT comments  This 73 yo female seen today and was able to get to EOB with min A (HOB up and use of bed rail), Mod A sit<>stand from raised bed and min A stand turn with RW with increased time from bed>recliner (simulated BSC transfer). Pt will Moderate Lean with sats remaining in 90's on 2 liters. She will continue to benefit from acute OT with follow up from  continued inpatient follow up therapy, <3 hours/day       If plan is discharge home, recommend the following:  A lot of help with bathing/dressing/bathroom;Two people to help with walking and/or transfers;Assist for transportation;Help with stairs or ramp for entrance;Assistance with cooking/housework   Equipment Recommendations  None recommended by OT       Precautions / Restrictions Precautions Precautions: Fall Recall of Precautions/Restrictions: Intact Precaution/Restrictions Comments: significant edema (una boots) Restrictions Weight Bearing Restrictions Per Provider Order: No       Mobility Bed Mobility Overal bed mobility: Needs Assistance Bed Mobility: Supine to Sit     Supine to sit: HOB elevated, Min assist     General bed mobility comments: A for legs only (minmally)    Transfers Overall transfer level: Needs assistance Equipment used: Rolling walker (2 wheels) Transfers: Sit to/from Stand, Bed to chair/wheelchair/BSC Sit to Stand: Mod assist     Step pivot transfers: Min assist           Balance Overall balance assessment: Needs  assistance Sitting-balance support: No upper extremity supported, Feet supported Sitting balance-Leahy Scale: Good     Standing balance support: Bilateral upper extremity supported, Reliant on assistive device for balance Standing balance-Leahy Scale: Poor                             ADL either performed or assessed with clinical judgement   ADL Overall ADL's : Needs assistance/impaired                         Toilet Transfer: Moderate assistance;Rolling walker (2 wheels) Toilet Transfer Details (indicate cue type and reason): simulated bed (slightly raised)>recliner next to bed                Extremity/Trunk Assessment Upper Extremity Assessment Upper Extremity Assessment: Generalized weakness            Vision Patient Visual Report: No change from baseline           Communication Communication Communication: No apparent difficulties   Cognition Arousal: Alert Behavior During Therapy: WFL for tasks assessed/performed Cognition: No apparent impairments                               Following commands: Intact        Cueing   Cueing Techniques: Verbal cues        General Comments VSS on 2 L with moderate [place] with bed to recliner transfer    Pertinent Vitals/ Pain       Pain Assessment Pain Assessment:  Faces Faces Pain Scale: Hurts even more Pain Location: Bil LEs Pain Descriptors / Indicators: Aching, Discomfort, Grimacing Pain Intervention(s): Limited activity within patient's tolerance, Monitored during session, Repositioned, Patient requesting pain meds-RN notified, RN gave pain meds during session      Progress Toward Goals  OT Goals(current goals can now be found in the care plan section)  Progress towards OT goals: Progressing toward goals  Acute Rehab OT Goals Patient Stated Goal: to continue to feel better and legs not hurt OT Goal Formulation: With patient Time For Goal Achievement: 12/03/23 Potential  to Achieve Goals: Good      f   AM-PAC OT 6 Clicks Daily Activity     Outcome Measure   Help from another person eating meals?: None Help from another person taking care of personal grooming?: A Little Help from another person toileting, which includes using toliet, bedpan, or urinal?: A Lot Help from another person bathing (including washing, rinsing, drying)?: A Lot Help from another person to put on and taking off regular upper body clothing?: A Lot Help from another person to put on and taking off regular lower body clothing?: Total 6 Click Score: 14    End of Session Equipment Utilized During Treatment: Rolling walker (2 wheels)  OT Visit Diagnosis: Unsteadiness on feet (R26.81);Other abnormalities of gait and mobility (R26.89);Pain Pain - part of body:  (legs)   Activity Tolerance Patient tolerated treatment well   Patient Left in chair;with call bell/phone within reach;with chair alarm set   Nurse Communication Mobility status        Time: 8563-8542 OT Time Calculation (min): 21 min  Charges: OT General Charges $OT Visit: 1 Visit OT Treatments $Self Care/Home Management : 8-22 mins Donny BECKER OT Acute Rehabilitation Services Office [phone]    Rodgers Dorothyann Distel 12/01/2023, 3:44 PM

## 2023-12-01 NOTE — Progress Notes (Signed)
 Palliative Medicine Inpatient Follow Up Note   HPI: 73 y.o. female with medical history significant of systolic heart failure, hypothyroidism, hyperlipidemia, COPD/asthma, history of DVT, fibromyalgia, GERD and type 2 diabetes mellitus with nephropathy; who presented to the hospital secondary to increased lower extremity swelling, shortness of breath with activity/orthopnea and weight gain.  Palliative care has been asked to support additional goals of care conversations in the setting of heart failure - NYHA Class IV.    Today's Discussion 12/01/2023  *Please note that this is a verbal dictation therefore any spelling or grammatical errors are due to the Dragon Medical One system interpretation.  Chart reviewed inclusive of vital signs, progress notes, laboratory results, and diagnostic images.   Created space and opportunity for patient to explore thoughts feelings and fears regarding current medical situation.  Patient was observed resting comfortably in bed, asleep but easily arousable. She was receiving supplemental oxygen  via nasal cannula at 2LPM. Upon awakening, she was alert, oriented, and able to express her needs clearly. She reported feeling better today compared to previous days and was not in any acute distress. She shared that a new IV/PICC line was recently placed for medication administration and blood draws. She continues to experience pain in her lower back and bilateral lower extremities, for which she took pain medication earlier with reported relief.  We revisited her goals of care. The patient demonstrated understanding of her overall medical condition, specifically her diagnosis of end-stage cardiomyopathy. She is aware that she is not a candidate for advanced therapies and is currently receiving Lasix  and a milrinone drip. At present, she wishes to continue appropriate medical interventions aimed at improving her condition.  The patient is currently designated as  Full Code. During our discussion, she expressed her faith, stating: "God is in control, it will be in His time." She wishes to remain Full Code and desires resuscitation efforts to be attempted up to three times. However, she also stated that if the result is suffering, she would want to be let go. I shared my clinical recommendation against resuscitation, repeated resuscitation attempts given her significant medical issues. She paused to reflect but remained firm in her decision to pursue resuscitation. We agreed that this decision can be revisited as her clinical status evolves.  All questions and concerns were addressed during the visit.  Patient  face treatment option decisions, advanced directive decisions and anticipatory care needs.   Palliative Support Provided.   Objective Assessment: Vital Signs Vitals:   12/01/23 0116 12/01/23 0814  BP: 109/85   Pulse: (!) 104   Resp: 16   Temp: 97.6 F (36.4 C)   SpO2: 100% 100%    Intake/Output Summary (Last 24 hours) at 12/01/2023 0911 Last data filed at 12/01/2023 9344 Gross per 24 hour  Intake 909.05 ml  Output 2650 ml  Net -1740.95 ml   Last Weight  Most recent update: 11/30/2023  4:29 AM    Weight  108 kg (238 lb)             Gen:   Obese, Chronically ill-appearing HEENT: moist mucous membranes CV: Regular rate and rhythm  PULM: On 2 L nasal cannula breathing is even and nonlabored ABD: soft/nontender  EXT: Generalized edema/+ anasarca in all extremities Neuro: Alert and oriented x3   SUMMARY OF RECOMMENDATIONS   Full code/Full scope of care  Allow time for outcomes Spiritual consult placed to assist with Advance Directive creation The PMT will continue following along and offering support  Time Spent: 35 minutes  Detailed review of medical records (labs, imaging, vital signs), medically appropriate exam, discussed with treatment team, counseling and education to patient, family, & staff, documenting clinical  information, coordination of care.   ______________________________________________________________________________________ Kathlyne Bolder NP-C [place] Palliative Medicine Team Team Cell Phone: [phone] Please utilize secure chat with additional questions, if there is no response within 30 minutes please call the above phone number  Palliative Medicine Team providers are available by phone from 7am to 7pm daily and can be reached through the team cell phone.  Should this patient require assistance outside of these hours, please call the patient's attending physician.

## 2023-12-01 NOTE — Assessment & Plan Note (Signed)
Old records personally reviewed, neurology follow up from 11/2020. Seropositive ocular myasthenia gravis, no significant bulbar, or limb weakness noted.  He is not longer on Mestinon or prednisone, never treated with long term steroids sparing agent.   

## 2023-12-01 NOTE — Assessment & Plan Note (Addendum)
 AKI Hyponatremia  Stable renal function with serum cr at 2,17 with K at 3,5 and serum bicarbonate at 25  Na 133 and Mg 1,9   Add 40 meq Kcl to avoid hypokalemia and 2 g mag sulfate to avoid hypomagnesemia.  Follow up renal function and electrolytes in am.

## 2023-12-01 NOTE — Assessment & Plan Note (Signed)
 Continue levothyroxine 

## 2023-12-01 NOTE — Progress Notes (Signed)
 Advanced Heart Failure Rounding Note  Cardiologist: Jayson Sierras, MD   Chief Complaint: Acute on chronic biventricular heart failure   Patient Profile   73 y/o female w/ chronic systolic heart failure w/ prominent RV dysfunction, hypertension, CKD IIIb, T2DM, OSA and h/o LE DVT admitted w/ a/c CHF w/ marked volume overload. Further w/u w/ RHC demonstrated severe RV dominant/ low output heart failure, felt to be end-stage. Now on milrinone to assist w/ palliative diuresis.   Subjective:    Remains on milrinone 0.25.   2.6L in UOP yesterday. No labs drawn yet for today. Today's wt not charted. Issues w/ RUE PICC. No longer functional. Lasix  gtt and milrinone running through PIVs.   Sitting up in bed. No respiratory difficulty but w/ asterixis and difficulty eating her breakfast. Says she is feeling fine and feels swelling is improving.    Objective:   Weight Range: 108 kg Body mass index is 36.19 kg/m.   Vital Signs:   Temp:  [97.3 F (36.3 C)-97.8 F (36.6 C)] 97.6 F (36.4 C) (10/15 0116) Pulse Rate:  [71-104] 104 (10/15 0116) Resp:  [12-20] 16 (10/15 0116) BP: (106-149)/(73-96) 109/85 (10/15 0116) SpO2:  [96 %-100 %] 100 % (10/15 0116) Last BM Date : 11/30/23  Weight change: Filed Weights   11/28/23 0439 11/29/23 0619 11/30/23 0325  Weight: 111.8 kg 110.1 kg 108 kg    Intake/Output:   Intake/Output Summary (Last 24 hours) at 12/01/2023 0714 Last data filed at 12/01/2023 9344 Gross per 24 hour  Intake 909.05 ml  Output 2650 ml  Net -1740.95 ml      Physical Exam    GENERAL: chronically ill and fatigued looking, NAD Lungs- diminished at the bases  CARDIAC:  JVD elevated to jaw           Regular rhythm and tachy rate. Systolic murmur. 2+ pitting edema up to thighs, B/l unna boots  ABDOMEN: Soft, non-tender, non-distended.  EXTREMITIES: Warm and well perfused.  NEUROLOGIC: + asterixis   Telemetry   Sinus tach low 100s, personally reviewed     Labs    CBC Recent Labs    11/29/23 1201 11/30/23 0819  WBC 9.0 8.1  NEUTROABS 6.7 5.7  HGB 9.1* 8.3*  HCT 27.6* 26.2*  MCV 83.1 84.2  PLT 118* 104*   Basic Metabolic Panel Recent Labs    89/86/74 0500 11/29/23 1201 11/30/23 0819 11/30/23 1500  NA 132*   < > 132* 131*  K 3.7   < > 3.5 3.8  CL 99   < > 97* 98  CO2 24   < > 25 24  GLUCOSE 144*   < > 200* 196*  BUN 29*   < > 29* 29*  CREATININE 1.75*   < > 1.78* 1.89*  CALCIUM  8.3*   < > 8.1* 8.4*  MG 1.8  --  1.9  --    < > = values in this interval not displayed.   Liver Function Tests Recent Labs    11/29/23 1201  AST 34  ALT 20  ALKPHOS 196*  BILITOT 2.3*  PROT 7.8  ALBUMIN 2.4*   No results for input(s): LIPASE, AMYLASE in the last 72 hours. Cardiac Enzymes No results for input(s): CKTOTAL, CKMB, CKMBINDEX, TROPONINI in the last 72 hours.  BNP: BNP (last 3 results) Recent Labs    01/08/23 1118 02/11/23 1445 11/18/23 1745  BNP 1,482.0* 1,213* 2,302.1*    ProBNP (last 3 results) Recent Labs  11/16/23 1201  PROBNP 14,798.0*     D-Dimer No results for input(s): DDIMER in the last 72 hours. Hemoglobin A1C No results for input(s): HGBA1C in the last 72 hours. Fasting Lipid Panel No results for input(s): CHOL, HDL, LDLCALC, TRIG, CHOLHDL, LDLDIRECT in the last 72 hours. Thyroid  Function Tests No results for input(s): TSH, T4TOTAL, T3FREE, THYROIDAB in the last 72 hours.  Invalid input(s): FREET3  Other results:   Imaging    No results found.    Medications:     Scheduled Medications:  arformoterol  15 mcg Nebulization BID   And   umeclidinium bromide  1 puff Inhalation Daily   vitamin C   500 mg Oral Daily   atorvastatin   80 mg Oral Daily   Chlorhexidine  Gluconate Cloth  6 each Topical Daily   cycloSPORINE  1 drop Both Eyes BID   DULoxetine   60 mg Oral BID   hydrocortisone   25 mg Rectal BID   insulin  aspart  0-15 Units  Subcutaneous TID WC   insulin  aspart  0-5 Units Subcutaneous QHS   levothyroxine   100 mcg Oral QAC breakfast   loratadine   10 mg Oral Daily   pantoprazole   40 mg Oral Daily   predniSONE   40 mg Oral Q breakfast   pregabalin   75 mg Oral BID   rifaximin  550 mg Oral BID   sodium chloride  flush  10-40 mL Intracatheter Q12H   sodium chloride  flush  3 mL Intravenous Q12H   spironolactone  25 mg Oral Daily   traZODone   150 mg Oral QHS   ursodiol  300 mg Oral BID    Infusions:  furosemide  (LASIX ) 200 mg in dextrose  5 % 100 mL (2 mg/mL) infusion 20 mg/hr (12/01/23 0655)   milrinone 0.25 mcg/kg/min (12/01/23 0655)    PRN Medications: acetaminophen  **OR** acetaminophen , acetaminophen , alum & mag hydroxide-simeth, hydrOXYzine , ipratropium-albuterol , lubiprostone , methocarbamol , ondansetron  **OR** ondansetron  (ZOFRAN ) IV, ondansetron  (ZOFRAN ) IV, sodium chloride  flush, sodium chloride  flush, traMADol     Assessment/Plan   1. Acute on Chronic Biventricular Heart Failure/RV Dominant  - Echo EF 35-40%, severe RV enlargment w/ mildly reduced systolic fx, estimated RVSP 32 mmHg. Mild MR/TR - RHC RA 27, PA 48/26, PCW 22, PVR 2.2, TD CI 1.8, PAPi 0.81>>Likely end-stage physiology  - not candidate for transplant given age, obesity and other co morbidities - not candidate for LVAD given severity of RV failure - will continue Milrinone 0.25 mcg/kg/min for RV support and to assist w/ diuresis - continue lasix  gtt at 20/hr  - plan to replace PICC to LUE today and resume CVP monitoring   - obtain stat BMP, supp lytes accordingly  - stopped bisoprolol  given low output  - will consider additional GDMT as renal fx permits - palliative care team following. Prognosis is poor. Palliative Perfomance Scale 30%. Hospice Care would be appropriate at discharge.   2. AKI on CKD IIIb - admit SCr 1.3, c/w baseline - SCr 1.8 yesterday. Repeat BMP pending  - suspect cardiorenal/low output  - continue milrinone  support - aggressive adjustment of diuretics to help w/ renal venous decongestion    3. CAD  - coronary artery classifications by CT imaging - no plans currently for LHC given renal dysfunction and absence of ischemic CP  - continue statin  - no ASA given apixaban  use    4. HTN - BP controlled    5. 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 Anemia/ Thrombocytopenia  Lower GI bleeding - suspect  anemia of chronic disease d/t renal/hepatic dysfunction  - hgb slowly drifting down - has been seen by GI, no plans for endoscopy - hold anticoagulation  7. Chronic liver disease - In setting of advanced biventricular heart failure - Asterixis on exam. Ammonia 56 on 10/13 - Started rifaximin 10/14, did not add lactulose d/t loose stools - repeat NH3 level   Length of Stay: [address] PA-C  12/01/2023, 7:14 AM  Advanced Heart Failure Team Pager [phone] (M-F; 7a - 5p)  Please contact [facility] for night-coverage after hours (5p -7a ) and weekends on [email or web address]

## 2023-12-02 DIAGNOSIS — E785 Hyperlipidemia, unspecified: Secondary | ICD-10-CM

## 2023-12-02 DIAGNOSIS — N1832 Chronic kidney disease, stage 3b: Secondary | ICD-10-CM | POA: Diagnosis not present

## 2023-12-02 DIAGNOSIS — Z8673 Personal history of transient ischemic attack (TIA), and cerebral infarction without residual deficits: Secondary | ICD-10-CM

## 2023-12-02 DIAGNOSIS — I10 Essential (primary) hypertension: Secondary | ICD-10-CM | POA: Diagnosis not present

## 2023-12-02 DIAGNOSIS — E1169 Type 2 diabetes mellitus with other specified complication: Secondary | ICD-10-CM

## 2023-12-02 DIAGNOSIS — E440 Moderate protein-calorie malnutrition: Secondary | ICD-10-CM

## 2023-12-02 DIAGNOSIS — E039 Hypothyroidism, unspecified: Secondary | ICD-10-CM

## 2023-12-02 DIAGNOSIS — I5023 Acute on chronic systolic (congestive) heart failure: Secondary | ICD-10-CM | POA: Diagnosis not present

## 2023-12-02 DIAGNOSIS — K219 Gastro-esophageal reflux disease without esophagitis: Secondary | ICD-10-CM

## 2023-12-02 DIAGNOSIS — J449 Chronic obstructive pulmonary disease, unspecified: Secondary | ICD-10-CM | POA: Diagnosis not present

## 2023-12-02 LAB — BASIC METABOLIC PANEL WITH GFR
Anion gap: 8 (ref 5–15)
BUN: 35 mg/dL — ABNORMAL HIGH (ref 8–23)
CO2: 24 mmol/L (ref 22–32)
Calcium: 8.6 mg/dL — ABNORMAL LOW (ref 8.9–10.3)
Chloride: 98 mmol/L (ref 98–111)
Creatinine, Ser: 1.96 mg/dL — ABNORMAL HIGH (ref 0.44–1.00)
GFR, Estimated: 27 mL/min — ABNORMAL LOW (ref >60)
Glucose, Bld: 239 mg/dL — ABNORMAL HIGH (ref 70–99)
Potassium: 3.8 mmol/L (ref 3.5–5.1)
Sodium: 130 mmol/L — ABNORMAL LOW (ref 135–145)

## 2023-12-02 LAB — GLUCOSE, CAPILLARY
Glucose-Capillary: 213 mg/dL — ABNORMAL HIGH (ref 70–99)
Glucose-Capillary: 176 mg/dL — ABNORMAL HIGH (ref 70–99)
Glucose-Capillary: 196 mg/dL — ABNORMAL HIGH (ref 70–99)
Glucose-Capillary: 204 mg/dL — ABNORMAL HIGH (ref 70–99)

## 2023-12-02 LAB — COOXEMETRY PANEL
Carboxyhemoglobin: 2.6 % — ABNORMAL HIGH (ref 0.5–1.5)
Carboxyhemoglobin: 2 % — ABNORMAL HIGH (ref 0.5–1.5)
Methemoglobin: 2 % — ABNORMAL HIGH (ref 0.0–1.5)
Methemoglobin: 0.9 % (ref 0.0–1.5)
O2 Saturation: 85.1 %
O2 Saturation: 94 %
Total hemoglobin: 8.6 g/dL — ABNORMAL LOW (ref 12.0–16.0)
Total hemoglobin: 8.6 g/dL — ABNORMAL LOW (ref 12.0–16.0)

## 2023-12-02 LAB — MAGNESIUM: Magnesium: 2.2 mg/dL (ref 1.7–2.4)

## 2023-12-02 MED ORDER — POTASSIUM CHLORIDE 20 MEQ PO PACK
20.0000 meq | PACK | Freq: Once | ORAL | Status: AC
  Administered 2023-12-02: 20 meq via ORAL
  Filled 2023-12-02: qty 1

## 2023-12-02 MED ORDER — DULOXETINE HCL 60 MG PO CPEP
60.0000 mg | ORAL_CAPSULE | Freq: Every day | ORAL | Status: DC
  Administered 2023-12-03 – 2023-12-06 (×4): 60 mg via ORAL
  Filled 2023-12-02 (×4): qty 1

## 2023-12-02 MED ORDER — PREGABALIN 75 MG PO CAPS
75.0000 mg | ORAL_CAPSULE | Freq: Every day | ORAL | Status: DC
  Administered 2023-12-03 – 2023-12-06 (×4): 75 mg via ORAL
  Filled 2023-12-02 (×4): qty 1

## 2023-12-02 NOTE — Progress Notes (Signed)
 Physical Therapy Treatment Patient Details Name: Stephanie [name] MRN: [id number] DOB: Jul 23, 1950 Today's Date: 12/02/2023   History of Present Illness 73 y.o. female who presented 9/30 with lower extremity swelling, SOB, and weight gain. Dx acute on chronic systolic heart failure. PMH includes: systolic heart failure, hypothyroidism, hyperlipidemia, COPD/asthma, history of DVT, fibromyalgia, GERD and type 2 diabetes mellitus with nephropathy.    PT Comments  Pt is progressing slowly toward goals, limited by weakness, deconditioning and dyspnea.  Emphasis on transitions to EOB, sitting tolerance, safety with STS and progression of gait.     If plan is discharge home, recommend the following: A lot of help with walking and/or transfers;A lot of help with bathing/dressing/bathroom;Assistance with cooking/housework   Can travel by private vehicle     No  Equipment Recommendations  Other (comment) (TBD)    Recommendations for Other Services       Precautions / Restrictions Precautions Precautions: Fall Recall of Precautions/Restrictions: Intact     Mobility  Bed Mobility Overal bed mobility: Needs Assistance Bed Mobility: Supine to Sit     Supine to sit: Min assist, HOB elevated     General bed mobility comments: minimal truncal assist up via L elbow with minimal assist of LE's off the bed.  LE's assist into bed.    Transfers Overall transfer level: Needs assistance   Transfers: Sit to/from Stand Sit to Stand: Mod assist, +2 safety/equipment           General transfer comment: cues for hand placement,  mod assist forward and for boost.    Ambulation/Gait Ambulation/Gait assistance: Mod assist, +2 safety/equipment Gait Distance (Feet): 20 Feet Assistive device: Rolling walker (2 wheels) Gait Pattern/deviations: Step-through pattern   Gait velocity interpretation: <1.31 ft/sec, indicative of household ambulator   General Gait Details: short, laborious steps, with  progressive WOB and need to sit.  After rest pt did not want to mobilize back to the starting point at EOB due to fatigue.   Stairs             Wheelchair Mobility     Tilt Bed    Modified Rankin (Stroke Patients Only)       Balance Overall balance assessment: Needs assistance Sitting-balance support: No upper extremity supported, Feet supported Sitting balance-Leahy Scale: Good Sitting balance - Comments: not challenged   Standing balance support: No upper extremity supported, Reliant on assistive device for balance Standing balance-Leahy Scale: Poor                              Communication Communication Communication: No apparent difficulties  Cognition Arousal: Alert Behavior During Therapy: Flat affect, WFL for tasks assessed/performed   PT - Cognitive impairments: No apparent impairments                         Following commands: Intact      Cueing Cueing Techniques: Verbal cues  Exercises      General Comments General comments (skin integrity, edema, etc.): VSS in general      Pertinent Vitals/Pain Pain Assessment Pain Assessment: No/denies pain    Home Living                          Prior Function            PT Goals (current goals can now be found in the care plan section)  Acute Rehab PT Goals Patient Stated Goal: Get well PT Goal Formulation: With patient Time For Goal Achievement: 12/02/23 Potential to Achieve Goals: Good Progress towards PT goals: Progressing toward goals    Frequency    Min 2X/week      PT Plan      Co-evaluation              AM-PAC PT 6 Clicks Mobility   Outcome Measure  Help needed turning from your back to your side while in a flat bed without using bedrails?: A Little Help needed moving from lying on your back to sitting on the side of a flat bed without using bedrails?: A Lot Help needed moving to and from a bed to a chair (including a wheelchair)?: A  Lot Help needed standing up from a chair using your arms (e.g., wheelchair or bedside chair)?: A Lot Help needed to walk in hospital room?: A Lot Help needed climbing 3-5 steps with a railing? : Total 6 Click Score: 12    End of Session     Patient left: with call bell/phone within reach;in chair;with chair alarm set Nurse Communication: Mobility status PT Visit Diagnosis: Unsteadiness on feet (R26.81);Other abnormalities of gait and mobility (R26.89);Muscle weakness (generalized) (M62.81);Difficulty in walking, not elsewhere classified (R26.2)     Time: 8657-8586 PT Time Calculation (min) (ACUTE ONLY): 31 min  Charges:    $Gait Training: 8-22 mins $Therapeutic Activity: 8-22 mins PT General Charges $$ ACUTE PT VISIT: 1 Visit                     12/02/2023  India HERO., PT Acute Rehabilitation Services [phone]  (office)   Stephanie [name] 12/02/2023, 5:11 PM

## 2023-12-02 NOTE — Progress Notes (Signed)
 Progress Note   Patient: Stephanie [name] FMW:[id number] DOB: 26-Dec-1950 DOA: 11/16/2023     16 DOS: the patient was seen and examined on 12/02/2023   Brief hospital course: Stephanie [name] was admitted to the hospital with the working diagnosis of heart failure exacerbation.   73 y.o. female with medical history significant of systolic heart failure, hypothyroidism, hyperlipidemia, COPD/asthma, history of DVT, fibromyalgia, GERD and type 2 diabetes mellitus with nephropathy; who presented to the hospital secondary to increased lower extremity edema, dyspnea on exertion, orthopnea and weight gain. Reported 3 to 4 days of worsening symptoms.      Demonstrating elevated BNP, signs of fluid overload on physical exam and a chest x-ray with vascular congestion/interstitial edema.  IV Lasix  was started  Echocardiograph with reduced LV systolic function and biventricular failure.  Right heart catheterization with severe RV failure, low output state, felt to be end stage. Placed on milrinone and furosemide  drip for palliative diuresis.  Developed lower gastrointestinal bleeding, diverticular or hemorrhoidal, conservative management recommended,   Placed on IV furosemide  and Milrinone for palliative diuresis   Assessment and Plan: * Heart failure, systolic, acute on chronic (HCC) Echocardiogram with reduced LV systolic function with EF 35 to 40%, mild dilatated LV cavity, RV systolic function with mild reduction, RV with severe enlargement, RVSP 31.8 mmHg.  LV akinetic anterior septum, mid infero septal segment and basal infero septal segment.   Urine output is 1,150 ml Systolic blood pressure 100 mmHg range  Sv02 85.1   Continue furosemide  drip at 20 mg/hr Reduce Milrinone 0.125 mcg/kg/min  Metolazone  5 mg x 2 (10/15)   Acetazolamide 500 mg bid (10/15)  Spironolactone 25 mg po daily.   Hypertension Continue blood pressure monitoring   Chronic kidney disease, stage 3b (HCC) AKI Hyponatremia  Today  renal function with serum cr at 1,96 with K at 3,8 and serum bicarbonate at 24 Na 130   Add 20 kcl to prevent hypokalemia  Continue aggressive diuresis  Follow up renal function and electrolytes,   COPD (chronic obstructive pulmonary disease) (HCC) No signs of acute exacerbation Continue bronchodilator therapy Discontinue prednisone    Type 2 diabetes mellitus with hyperlipidemia (HCC) Continue glucose cover and monitoring with insulin  sliding scale Hold on systemic steroids.  Positive hyperglycemia   Continue statin therapy  Hypothyroidism Continue levothyroxine    GERD (gastroesophageal reflux disease) Continue with pantoprazole    Protein-calorie malnutrition, moderate Nutritional supplements  History of CVA (cerebrovascular accident) Continue blood pressure monitoring   Obesity Calculated BMI 31.9 consistent with obesity class 1        Subjective: Patient with chills this morning, continue very weak and deconditioned, no chest pain   Physical Exam: Vitals:   12/02/23 0111 12/02/23 0555 12/02/23 0746 12/02/23 0814  BP: 124/60 (!) 124/59 116/64   Pulse:   99   Resp:   16   Temp: (!) 97.3 F (36.3 C) (!) 97.3 F (36.3 C) 97.8 F (36.6 C)   TempSrc: Axillary Axillary Oral   SpO2:   100% 100%  Weight:  102.8 kg    Height:       Neurology awake and alert ENT with mild pallor Cardiovascular with S1 and S2 present and regular, positive systolic murmur at the left lower sternal border Positive JVD Respiratory with poor inspiratory effort with no wheezing or rhonchi, no rales, anterior auscultation  Abdomen with no distention Lower extremity with trace edema, unna boots in place   Data Reviewed:    Family Communication: no family  at the bedside   Disposition: Status is: Inpatient Remains inpatient appropriate because: heart failure   Planned Discharge Destination: Home     Author: Elidia Toribio Furnace, MD 12/02/2023 10:02 AM  For on call  review [email or web address].

## 2023-12-02 NOTE — Progress Notes (Signed)
 Advanced Heart Failure Rounding Note  Cardiologist: Jayson Sierras, MD   Chief Complaint: Acute on chronic biventricular heart failure   Patient Profile   73 y/o female w/ chronic systolic heart failure w/ prominent RV dysfunction, hypertension, CKD IIIb, T2DM, OSA and h/o LE DVT admitted w/ a/c CHF w/ marked volume overload. Further w/u w/ RHC demonstrated severe RV dominant/ low output heart failure, felt to be end-stage. Now on milrinone to assist w/ palliative diuresis.   Subjective:    Wt down significantly since admit. C/w signs of progressive MSOF w/ renal and hepatic involvement. C/w asterixis, intermittent confusion and weakness. SOB moving from bed to chair.   A bit resistant to hospice care discussion.    Objective:   Weight Range: 102.8 kg Body mass index is 34.46 kg/m.   Vital Signs:   Temp:  [97.3 F (36.3 C)-97.8 F (36.6 C)] 97.8 F (36.6 C) (10/16 0746) Pulse Rate:  [94-101] 99 (10/16 0746) Resp:  [15-20] 16 (10/16 0746) BP: (115-128)/(59-100) 116/64 (10/16 0746) SpO2:  [100 %] 100 % (10/16 0814) Weight:  [102.8 kg] 102.8 kg (10/16 0555) Last BM Date : 12/01/23  Weight change: Filed Weights   11/30/23 0325 12/01/23 1000 12/02/23 0555  Weight: 108 kg 95.3 kg 102.8 kg    Intake/Output:   Intake/Output Summary (Last 24 hours) at 12/02/2023 1130 Last data filed at 12/02/2023 0500 Gross per 24 hour  Intake 643.76 ml  Output 1150 ml  Net -506.24 ml      Physical Exam    GENERAL: fatigued/ chronically ill appearing, SOB w/ minimal exertion  Lungs- diminished at the bases bilaterally  CARDIAC:  JVP moderately elevated          Normal regular rhythm, tachy rate.  Bilateral UNNA boots w/ trace-1+ b/l LEE   ABDOMEN: Soft, non-tender, non-distended.  EXTREMITIES: Warm and well perfused.  NEUROLOGIC: + asterixis   Telemetry   Sinus tach, low 100s, personally reviewed    Labs    CBC Recent Labs    11/29/23 1201 11/30/23 0819  12/01/23 1230  WBC 9.0 8.1 8.7  NEUTROABS 6.7 5.7  --   HGB 9.1* 8.3* 8.5*  HCT 27.6* 26.2* 26.7*  MCV 83.1 84.2 84.8  PLT 118* 104* 117*   Basic Metabolic Panel Recent Labs    89/84/74 1224 12/02/23 0415  NA 131* 130*  K 4.3 3.8  CL 99 98  CO2 22 24  GLUCOSE 224* 239*  BUN 32* 35*  CREATININE 1.98* 1.96*  CALCIUM  8.6* 8.6*  MG 2.1 2.2   Liver Function Tests Recent Labs    11/29/23 1201  AST 34  ALT 20  ALKPHOS 196*  BILITOT 2.3*  PROT 7.8  ALBUMIN 2.4*   No results for input(s): LIPASE, AMYLASE in the last 72 hours. Cardiac Enzymes No results for input(s): CKTOTAL, CKMB, CKMBINDEX, TROPONINI in the last 72 hours.  BNP: BNP (last 3 results) Recent Labs    01/08/23 1118 02/11/23 1445 11/18/23 1745  BNP 1,482.0* 1,213* 2,302.1*    ProBNP (last 3 results) Recent Labs    11/16/23 1201  PROBNP 14,798.0*     D-Dimer No results for input(s): DDIMER in the last 72 hours. Hemoglobin A1C No results for input(s): HGBA1C in the last 72 hours. Fasting Lipid Panel No results for input(s): CHOL, HDL, LDLCALC, TRIG, CHOLHDL, LDLDIRECT in the last 72 hours. Thyroid  Function Tests No results for input(s): TSH, T4TOTAL, T3FREE, THYROIDAB in the last 72 hours.  Invalid input(s):  FREET3  Other results:   Imaging    No results found.    Medications:     Scheduled Medications:  arformoterol  15 mcg Nebulization BID   And   umeclidinium bromide  1 puff Inhalation Daily   vitamin C   500 mg Oral Daily   atorvastatin   80 mg Oral Daily   Chlorhexidine  Gluconate Cloth  6 each Topical Daily   cycloSPORINE  1 drop Both Eyes BID   [START ON 12/03/2023] DULoxetine   60 mg Oral Daily   insulin  aspart  0-15 Units Subcutaneous TID WC   insulin  aspart  0-5 Units Subcutaneous QHS   levothyroxine   100 mcg Oral QAC breakfast   loratadine   10 mg Oral Daily   pantoprazole   40 mg Oral Daily   potassium chloride   20 mEq Oral Once    [START ON 12/03/2023] pregabalin   75 mg Oral Daily   rifaximin  550 mg Oral BID   sodium chloride  flush  10-40 mL Intracatheter Q12H   sodium chloride  flush  3 mL Intravenous Q12H   spironolactone  25 mg Oral Daily   traZODone   150 mg Oral QHS   ursodiol  300 mg Oral BID    Infusions:  furosemide  (LASIX ) 200 mg in dextrose  5 % 100 mL (2 mg/mL) infusion 20 mg/hr (12/02/23 0500)   milrinone 0.25 mcg/kg/min (12/02/23 0500)    PRN Medications: acetaminophen  **OR** acetaminophen , acetaminophen , alum & mag hydroxide-simeth, hydrOXYzine , ipratropium-albuterol , lubiprostone , methocarbamol , ondansetron  **OR** ondansetron  (ZOFRAN ) IV, ondansetron  (ZOFRAN ) IV, sodium chloride  flush, sodium chloride  flush, traMADol     Assessment/Plan   1. 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 Mild MR/TR - RHC RA 27, PA 48/26, PCW 22, PVR 2.2, TD CI 1.8, PAPi 0.81>>Likely end-stage physiology  - not candidate for transplant given age, obesity and other co morbidities - not candidate for LVAD given severity of RV failure - significant wt loss from diuresis. Continues w/ MSOF w/ both renal and hepatic involvement  - will plan milrinone wean, reduce down to 0.125 and check repeat Co-ox this PM. If ok, will plan to turn off - will keep Lasix  gtt running through today and plan to stop at 7 PM and plan to start bolus dosing tomorrow  - GDMT limited by CKD - discussed again w/ patient severity off illness/ refractory HF despite current support and recommended continued GOC discussion w/ PC and family, w/ strong recommendation to consider hospice care support.    2. AKI on CKD IIIb - admit SCr 1.3, c/w baseline - SCr relatively stable w/ diuresis, at 1.9  - suspect cardiorenal/low output  - will start milrinone wean today    3. CAD  - coronary artery classifications by CT  imaging - no plans currently for [facility] given renal dysfunction and absence of ischemic CP  - continue statin    4. HTN - BP controlled    5. 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 Anemia/ Thrombocytopenia  Lower GI bleeding - suspect anemia of chronic disease d/t renal/hepatic dysfunction  - hgb slowly drifting down - has been seen by GI, no plans for endoscopy - hold anticoagulation. No plan to restart   7. Chronic liver disease - In setting of advanced biventricular heart failure - Asterixis on exam. Ammonia 56 on 10/13 - Started rifaximin 10/14, did not add lactulose d/t loose stools  Length of Stay: [address], PA-C  12/02/2023, 11:30 AM  Advanced Heart Failure Team Pager [phone] (M-F; 7a - 5p)  Please contact [facility] for night-coverage after hours (5p -7a ) and weekends on [email or web address]

## 2023-12-02 NOTE — Progress Notes (Addendum)
 This chaplain attempted a revisit for AD education. The Pt. is participating in patient care at the time of the visit. The chaplain will attempt a revisit.  **1526 This chaplain returned to the Pt. bedside and listened reflectively as the Pt. talked about her medical care and how God remains her source of hope and direction. The chaplain understands the Pt. goal is to discharge to SNF and then return home. The Pt. opportunity to continue the relationship with her great granddaughter is her strength, unless God calls her home.   The chaplain began Advance Directive education with the Pt. The chaplain understands the Pt. two daughters will share the role of healthcare agents. The chaplain left the blank AD at the Pt. bedside for the Pt. to review before the next visit.  This chaplain is available for F/U spiritual care as needed.  Chaplain Leeroy Hummer [phone]

## 2023-12-02 NOTE — TOC Progression Note (Signed)
 Transition of Care [facility]) - Progression Note    Patient Details  Name: Stephanie [name] MRN: [id number] Date of Birth: 1950-05-26  Transition of [facility]) CM/SW Contact  Justina Delcia Czar, RN Phone Number: [phone] 12/02/2023, 11:42 AM  Clinical Narrative:     Chart reviewed for discharge readiness, patient not medically stable for d/c. Continues with IV Milrinone.  Inpatient CM/CSW will continue to monitor pt's advancement through interdisciplinary progression rounds.  Plan dc to [facility] rehab once medically stable. Inpatient CSW will start insurance auth closer to medical readiness.    Expected Discharge Plan: Skilled Nursing Facility Barriers to Discharge: Continued Medical Work up     Expected Discharge Plan and Services In-house Referral: Clinical Social Work   Post Acute Care Choice: Skilled Nursing Facility Living arrangements for the past 2 months: Single Family Home                                       Social Drivers of Health (SDOH) Interventions SDOH Screenings   Food Insecurity: No Food Insecurity (11/17/2023)  Housing: Low Risk  (11/17/2023)  Transportation Needs: No Transportation Needs (11/17/2023)  Utilities: Not At Risk (11/17/2023)  Depression (PHQ2-9): Low Risk  (09/14/2023)  Social Connections: Moderately Isolated (11/17/2023)  Tobacco Use: Medium Risk (11/16/2023)    Readmission Risk Interventions     No data to display

## 2023-12-03 DIAGNOSIS — I10 Essential (primary) hypertension: Secondary | ICD-10-CM | POA: Diagnosis not present

## 2023-12-03 DIAGNOSIS — N1832 Chronic kidney disease, stage 3b: Secondary | ICD-10-CM | POA: Diagnosis not present

## 2023-12-03 DIAGNOSIS — I5023 Acute on chronic systolic (congestive) heart failure: Secondary | ICD-10-CM | POA: Diagnosis not present

## 2023-12-03 DIAGNOSIS — J449 Chronic obstructive pulmonary disease, unspecified: Secondary | ICD-10-CM | POA: Diagnosis not present

## 2023-12-03 LAB — GLUCOSE, CAPILLARY
Glucose-Capillary: 144 mg/dL — ABNORMAL HIGH (ref 70–99)
Glucose-Capillary: 150 mg/dL — ABNORMAL HIGH (ref 70–99)
Glucose-Capillary: 141 mg/dL — ABNORMAL HIGH (ref 70–99)
Glucose-Capillary: 152 mg/dL — ABNORMAL HIGH (ref 70–99)

## 2023-12-03 LAB — BASIC METABOLIC PANEL WITH GFR
Anion gap: 10 (ref 5–15)
BUN: 37 mg/dL — ABNORMAL HIGH (ref 8–23)
CO2: 24 mmol/L (ref 22–32)
Calcium: 8.8 mg/dL — ABNORMAL LOW (ref 8.9–10.3)
Chloride: 97 mmol/L — ABNORMAL LOW (ref 98–111)
Creatinine, Ser: 2.07 mg/dL — ABNORMAL HIGH (ref 0.44–1.00)
GFR, Estimated: 25 mL/min — ABNORMAL LOW (ref >60)
Glucose, Bld: 162 mg/dL — ABNORMAL HIGH (ref 70–99)
Potassium: 3.2 mmol/L — ABNORMAL LOW (ref 3.5–5.1)
Sodium: 131 mmol/L — ABNORMAL LOW (ref 135–145)

## 2023-12-03 LAB — COOXEMETRY PANEL
Carboxyhemoglobin: 2.8 % — ABNORMAL HIGH (ref 0.5–1.5)
Methemoglobin: <0.7 % (ref 0.0–1.5)
O2 Saturation: 75.2 %
Total hemoglobin: 8.2 g/dL — ABNORMAL LOW (ref 12.0–16.0)

## 2023-12-03 LAB — MAGNESIUM: Magnesium: 1.9 mg/dL (ref 1.7–2.4)

## 2023-12-03 MED ORDER — APIXABAN 5 MG PO TABS
5.0000 mg | ORAL_TABLET | Freq: Two times a day (BID) | ORAL | Status: DC
  Administered 2023-12-03 – 2023-12-06 (×7): 5 mg via ORAL
  Filled 2023-12-03 (×7): qty 1

## 2023-12-03 MED ORDER — MAGNESIUM SULFATE 2 GM/50ML IV SOLN
2.0000 g | Freq: Once | INTRAVENOUS | Status: AC
  Administered 2023-12-03: 2 g via INTRAVENOUS
  Filled 2023-12-03: qty 50

## 2023-12-03 MED ORDER — POTASSIUM CHLORIDE CRYS ER 20 MEQ PO TBCR
40.0000 meq | EXTENDED_RELEASE_TABLET | ORAL | Status: AC
  Administered 2023-12-03 (×2): 40 meq via ORAL
  Filled 2023-12-03 (×2): qty 2

## 2023-12-03 MED ORDER — MIDAZOLAM HCL (PF) 5 MG/ML IJ SOLN: 2.0000 mg | Freq: Once | INTRAMUSCULAR | Status: DC | PRN

## 2023-12-03 MED ORDER — TORSEMIDE 20 MG PO TABS
60.0000 mg | ORAL_TABLET | Freq: Two times a day (BID) | ORAL | Status: DC
  Administered 2023-12-03 – 2023-12-06 (×7): 60 mg via ORAL
  Filled 2023-12-03 (×7): qty 3

## 2023-12-03 NOTE — Assessment & Plan Note (Signed)
 Continue with urosidol.  Chronic anemia, with follow up hgb at 8,4

## 2023-12-03 NOTE — TOC Progression Note (Signed)
 Transition of Care [facility]) - Progression Note    Patient Details  Name: Stephanie [name] MRN: [id number] Date of Birth: 1950/04/07  Transition of Care [facility]) CM/SW Contact  Almarie CHRISTELLA Goodie, [place] Phone Number: 12/03/2023, 10:26 AM  Clinical Narrative:   CSW updated by MD that patient is stable for discharge to SNF. CSW updated [facility] and contacted CMA to request insurance authorization. CSW to follow.    Expected Discharge Plan: Skilled Nursing Facility Barriers to Discharge: Continued Medical Work up               Expected Discharge Plan and Services In-house Referral: Clinical Social Work   Post Acute Care Choice: Skilled Nursing Facility Living arrangements for the past 2 months: Single Family Home                                       Social Drivers of Health (SDOH) Interventions SDOH Screenings   Food Insecurity: No Food Insecurity (11/17/2023)  Housing: Low Risk  (11/17/2023)  Transportation Needs: No Transportation Needs (11/17/2023)  Utilities: Not At Risk (11/17/2023)  Depression (PHQ2-9): Low Risk  (09/14/2023)  Social Connections: Moderately Isolated (11/17/2023)  Tobacco Use: Medium Risk (11/16/2023)    Readmission Risk Interventions     No data to display

## 2023-12-03 NOTE — Progress Notes (Signed)
 Advanced Heart Failure Rounding Note  Cardiologist: Jayson Sierras, MD   Chief Complaint: Acute on chronic biventricular heart failure   Patient Profile   73 y/o female w/ chronic systolic heart failure w/ prominent RV dysfunction, hypertension, CKD IIIb, T2DM, OSA and h/o LE DVT admitted w/ a/c CHF w/ marked volume overload. Further w/u w/ RHC demonstrated severe RV dominant/ low output heart failure, felt to be end-stage. This admission requiring milrinone to assist w/ palliative diuresis.   Subjective:    Has diuresed > 60 lb this admission but remains significantly volume overloaded. Profoundly weak w/ evidence of MSOF. Has not been ready to discuss goals of care including transition to hospice. Has firm believe God will heal her illness.    Objective:   Weight Range: 102.6 kg Body mass index is 34.39 kg/m.   Vital Signs:   Temp:  [97.4 F (36.3 C)-98.1 F (36.7 C)] 98.1 F (36.7 C) (10/17 0744) Pulse Rate:  [87-101] 101 (10/17 0744) Resp:  [0-19] 17 (10/17 0744) BP: (97-131)/(60-100) 123/76 (10/17 0744) SpO2:  [99 %-100 %] 100 % (10/17 0744) Weight:  [102.6 kg] 102.6 kg (10/17 0432) Last BM Date : 12/02/23  Weight change: Filed Weights   12/01/23 1000 12/02/23 0555 12/03/23 0432  Weight: 95.3 kg 102.8 kg 102.6 kg    Intake/Output:   Intake/Output Summary (Last 24 hours) at 12/03/2023 0908 Last data filed at 12/03/2023 0601 Gross per 24 hour  Intake 363 ml  Output 2600 ml  Net -2237 ml      Physical Exam    General:  Ill appearing HEENT: scleral icterus Cor:  JVP 10-12. Regular rate & rhythm. No murmurs. Lungs: breathing nonlabored Abdomen: obese, + edema Extremities: + UNNA boots, 1-2+ edema Neuro: alert & orientedx3. Affect pleasant. + asterixis.   Telemetry   Sinus tach, low 100s, personally reviewed    Labs    CBC Recent Labs    12/01/23 1230  WBC 8.7  HGB 8.5*  HCT 26.7*  MCV 84.8  PLT 117*   Basic Metabolic Panel Recent  Labs    12/02/23 0415 12/03/23 0631  NA 130* 131*  K 3.8 3.2*  CL 98 97*  CO2 24 24  GLUCOSE 239* 162*  BUN 35* 37*  CREATININE 1.96* 2.07*  CALCIUM  8.6* 8.8*  MG 2.2 1.9   Liver Function Tests No results for input(s): AST, ALT, ALKPHOS, BILITOT, PROT, ALBUMIN in the last 72 hours.  No results for input(s): LIPASE, AMYLASE in the last 72 hours. Cardiac Enzymes No results for input(s): CKTOTAL, CKMB, CKMBINDEX, TROPONINI in the last 72 hours.  BNP: BNP (last 3 results) Recent Labs    01/08/23 1118 02/11/23 1445 11/18/23 1745  BNP 1,482.0* 1,213* 2,302.1*    ProBNP (last 3 results) Recent Labs    11/16/23 1201  PROBNP 14,798.0*     D-Dimer No results for input(s): DDIMER in the last 72 hours. Hemoglobin A1C No results for input(s): HGBA1C in the last 72 hours. Fasting Lipid Panel No results for input(s): CHOL, HDL, LDLCALC, TRIG, CHOLHDL, LDLDIRECT in the last 72 hours. Thyroid  Function Tests No results for input(s): TSH, T4TOTAL, T3FREE, THYROIDAB in the last 72 hours.  Invalid input(s): FREET3  Other results:   Imaging    No results found.    Medications:     Scheduled Medications:  arformoterol  15 mcg Nebulization BID   And   umeclidinium bromide  1 puff Inhalation Daily   vitamin C   500 mg  Oral Daily   atorvastatin   80 mg Oral Daily   Chlorhexidine  Gluconate Cloth  6 each Topical Daily   cycloSPORINE  1 drop Both Eyes BID   DULoxetine   60 mg Oral Daily   insulin  aspart  0-15 Units Subcutaneous TID WC   insulin  aspart  0-5 Units Subcutaneous QHS   levothyroxine   100 mcg Oral QAC breakfast   loratadine   10 mg Oral Daily   pantoprazole   40 mg Oral Daily   potassium chloride   40 mEq Oral Q4H   pregabalin   75 mg Oral Daily   rifaximin  550 mg Oral BID   sodium chloride  flush  10-40 mL Intracatheter Q12H   sodium chloride  flush  3 mL Intravenous Q12H   spironolactone  25 mg Oral Daily    torsemide   60 mg Oral BID   traZODone   150 mg Oral QHS   ursodiol  300 mg Oral BID    Infusions:  magnesium  sulfate bolus IVPB 2 g (12/03/23 0907)    PRN Medications: acetaminophen  **OR** acetaminophen , acetaminophen , alum & mag hydroxide-simeth, hydrOXYzine , ipratropium-albuterol , lubiprostone , methocarbamol , ondansetron  **OR** ondansetron  (ZOFRAN ) IV, ondansetron  (ZOFRAN ) IV, sodium chloride  flush, sodium chloride  flush, traMADol     Assessment/Plan   1. 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 Mild MR/TR - RHC RA 27, PA 48/26, PCW 22, PVR 2.2, TD CI 1.8, PAPi 0.81>>Likely end-stage physiology  - not candidate for transplant given age, obesity and other co morbidities - not candidate for LVAD given severity of RV failure - significant wt loss from diuresis. Continues w/ MSOF w/ both renal and hepatic involvement  - CO-OX 75% this am on 0.125 milrinone. Will stop. - Keep off IV lasix . Start Torsemide  60 mg BID (80 daily prior to admit). - Continue spiro 25 mg daily - No beta blocker with low-output HF - GDMT limited by CKD - We've had multiple discussions with the patient regarding severity of illness/ refractory HF despite current support and recommended continued GOC discussion w/ Palliative Care and family. He has been resistant to considering transition to hospice. Remains full code.  2. AKI on CKD IIIb - admit SCr 1.3, c/w baseline - Scr ~ 2 the last few days - suspect cardiorenal/low output     3. CAD  - coronary artery classifications by CT imaging - no plans currently for [facility] given renal dysfunction and absence of ischemic CP  - continue statin    4. HTN - BP controlled    5. 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 Anemia/ Thrombocytopenia  Lower GI bleeding - suspect anemia of chronic disease d/t renal/hepatic dysfunction  - hgb  slowly drifted down, now stable in 8s - has been seen by GI, no plans for endoscopy - Eliquis  had been held, now adding back per primary as she is no longer having bleeding  7. Chronic liver disease - In setting of advanced biventricular heart failure - Asterixis on exam.   Overall poor prognosis. Wants to continue with aggressive medical care for now. Plans for discharge to SNF for rehab then try to return home.  Length of Stay: [address] Zerrick Hanssen N, PA-C  12/03/2023, 9:08 AM  Advanced Heart Failure Team Pager [phone] (M-F; 7a - 5p)  Please contact [facility] for night-coverage after hours (5p -7a ) and weekends on [email or web address]

## 2023-12-03 NOTE — Assessment & Plan Note (Signed)
 Patient with no further bleeding, risk vs benefit will resume anticoagulation

## 2023-12-03 NOTE — Progress Notes (Addendum)
 Progress Note   Patient: Stephanie [name] FMW:[id number] DOB: 12/23/1950 DOA: 11/16/2023     17 DOS: the patient was seen and examined on 12/03/2023   Brief hospital course: Stephanie [name] was admitted to the hospital with the working diagnosis of heart failure exacerbation.   73 y.o. female with medical history significant of systolic heart failure, hypothyroidism, hyperlipidemia, COPD/asthma, history of DVT, fibromyalgia, GERD and type 2 diabetes mellitus with nephropathy; who presented to the hospital secondary to increased lower extremity edema, dyspnea on exertion, orthopnea and weight gain. Reported 3 to 4 days of worsening symptoms.      Demonstrating elevated BNP, signs of fluid overload on physical exam and a chest x-ray with vascular congestion/interstitial edema.  IV Lasix  was started  Echocardiograph with reduced LV systolic function and biventricular failure.  Right heart catheterization with severe RV failure, low output state, felt to be end stage. Placed on milrinone and furosemide  drip for palliative diuresis.  Developed lower gastrointestinal bleeding, diverticular or hemorrhoidal, conservative management recommended,   Placed on IV furosemide  and Milrinone for palliative diuresis  10/16 milrinone weaned off 10/17 off milrinone with good toleration.   Assessment and Plan: * Heart failure, systolic, acute on chronic (HCC) Echocardiogram with reduced LV systolic function with EF 35 to 40%, mild dilatated LV cavity, RV systolic function with mild reduction, RV with severe enlargement, RVSP 31.8 mmHg.  LV akinetic anterior septum, mid infero septal segment and basal infero septal segment.   Urine output is 2,600 ml Systolic blood pressure 110 mmHg range  Sv02 75.2   Off milrinone and transitioned to oral loop diuretic  Metolazone  5 mg x 2 (10/15)   Acetazolamide 500 mg bid  x2 (10/15)  Spironolactone 25 mg po daily.  Limited medical therapy due to risk of hypotension.  Poor  prognosis.   Hypertension Continue blood pressure monitoring   Chronic kidney disease, stage 3b (HCC) AKI Hyponatremia  Renal function with serum cr at 2,0 with K at 3.2 and serum bicarbonate at 24  Na 131 and Mg 1.9  Continue K and Mg correction, 40 kcl x2 and 2 g mag sulfate  Follow up renal function and electrolytes,   COPD (chronic obstructive pulmonary disease) (HCC) No signs of acute exacerbation Continue bronchodilator therapy Discontinue prednisone    Type 2 diabetes mellitus with hyperlipidemia (HCC) Continue glucose cover and monitoring with insulin  sliding scale Hold on systemic steroids.  Positive hyperglycemia   Continue statin therapy  Hypothyroidism Continue levothyroxine    GERD (gastroesophageal reflux disease) Continue with pantoprazole    Protein-calorie malnutrition, moderate Nutritional supplements  History of CVA (cerebrovascular accident) Continue blood pressure monitoring   Obesity Calculated BMI 31.9 consistent with obesity class 1  Deep vein thrombosis (DVT) of both lower extremities (HCC) Patient with no further bleeding, risk vs benefit will resume anticoagulation   NASH (nonalcoholic steatohepatitis) Continue rifaximin and urosidol.        Subjective: Patient is feeling better, no chest pain and no dyspnea at rest, no PND or orthopnea, lower extremity edema has improved, continue very weak and deconditioned   Physical Exam: Vitals:   12/02/23 2332 12/03/23 0432 12/03/23 0604 12/03/23 0744  BP: 131/70 109/75 100/71 123/76  Pulse: 98 100 99 (!) 101  Resp: 16 18 19 17   Temp: 97.6 F (36.4 C) 97.6 F (36.4 C)  98.1 F (36.7 C)  TempSrc: Oral Oral  Oral  SpO2: 100% 100% 99% 100%  Weight:  102.6 kg    Height:  Neurology awake and alert ENT with mild pallor Cardiovascular with S1 and S2 present and regular with no gallops, rubs or murmurs Respiratory with no rales or wheezing, no rhonchi  Abdomen with no distention   Lower extremity with trace edema, unna boots in place  Data Reviewed:    Family Communication: no family at the bedside   Disposition: Status is: Inpatient Remains inpatient appropriate because: pending SNF   Planned Discharge Destination: Skilled nursing facility     Author: Elidia Toribio Furnace, MD 12/03/2023 9:28 AM  For on call review [email or web address].

## 2023-12-04 DIAGNOSIS — I5023 Acute on chronic systolic (congestive) heart failure: Secondary | ICD-10-CM | POA: Diagnosis not present

## 2023-12-04 DIAGNOSIS — F32A Depression, unspecified: Secondary | ICD-10-CM

## 2023-12-04 DIAGNOSIS — J449 Chronic obstructive pulmonary disease, unspecified: Secondary | ICD-10-CM | POA: Diagnosis not present

## 2023-12-04 DIAGNOSIS — I10 Essential (primary) hypertension: Secondary | ICD-10-CM | POA: Diagnosis not present

## 2023-12-04 DIAGNOSIS — N1832 Chronic kidney disease, stage 3b: Secondary | ICD-10-CM | POA: Diagnosis not present

## 2023-12-04 LAB — CBC
HCT: 26.1 % — ABNORMAL LOW (ref 36.0–46.0)
Hemoglobin: 8.4 g/dL — ABNORMAL LOW (ref 12.0–15.0)
MCH: 27.3 pg (ref 26.0–34.0)
MCHC: 32.2 g/dL (ref 30.0–36.0)
MCV: 84.7 fL (ref 80.0–100.0)
Platelets: 118 K/uL — ABNORMAL LOW (ref 150–400)
RBC: 3.08 MIL/uL — ABNORMAL LOW (ref 3.87–5.11)
RDW: 16 % — ABNORMAL HIGH (ref 11.5–15.5)
WBC: 10.1 K/uL (ref 4.0–10.5)
nRBC: 0 % (ref 0.0–0.2)

## 2023-12-04 LAB — GLUCOSE, CAPILLARY
Glucose-Capillary: 118 mg/dL — ABNORMAL HIGH (ref 70–99)
Glucose-Capillary: 164 mg/dL — ABNORMAL HIGH (ref 70–99)
Glucose-Capillary: 127 mg/dL — ABNORMAL HIGH (ref 70–99)
Glucose-Capillary: 168 mg/dL — ABNORMAL HIGH (ref 70–99)

## 2023-12-04 LAB — BASIC METABOLIC PANEL WITH GFR
Anion gap: 10 (ref 5–15)
BUN: 38 mg/dL — ABNORMAL HIGH (ref 8–23)
CO2: 25 mmol/L (ref 22–32)
Calcium: 8.8 mg/dL — ABNORMAL LOW (ref 8.9–10.3)
Chloride: 98 mmol/L (ref 98–111)
Creatinine, Ser: 2.17 mg/dL — ABNORMAL HIGH (ref 0.44–1.00)
GFR, Estimated: 23 mL/min — ABNORMAL LOW (ref >60)
Glucose, Bld: 124 mg/dL — ABNORMAL HIGH (ref 70–99)
Potassium: 3.5 mmol/L (ref 3.5–5.1)
Sodium: 133 mmol/L — ABNORMAL LOW (ref 135–145)

## 2023-12-04 LAB — MAGNESIUM: Magnesium: 1.9 mg/dL (ref 1.7–2.4)

## 2023-12-04 MED ORDER — MAGNESIUM SULFATE 2 GM/50ML IV SOLN
2.0000 g | Freq: Once | INTRAVENOUS | Status: AC
  Administered 2023-12-04: 2 g via INTRAVENOUS
  Filled 2023-12-04: qty 50

## 2023-12-04 MED ORDER — POTASSIUM CHLORIDE CRYS ER 20 MEQ PO TBCR
40.0000 meq | EXTENDED_RELEASE_TABLET | Freq: Once | ORAL | Status: AC
  Administered 2023-12-04: 40 meq via ORAL
  Filled 2023-12-04: qty 2

## 2023-12-04 NOTE — Plan of Care (Signed)

## 2023-12-04 NOTE — Progress Notes (Addendum)
 Progress Note   Patient: Stephanie [name] FMW:[id number] DOB: [birth year] DOA: 11/16/2023     18 DOS: the patient was seen and examined on 12/04/2023   Brief hospital course: Mrs. Lappe was admitted to the hospital with the working diagnosis of heart failure exacerbation.   73 y.o. female with medical history significant of systolic heart failure, hypothyroidism, hyperlipidemia, COPD/asthma, history of DVT, fibromyalgia, GERD and type 2 diabetes mellitus with nephropathy; who presented to the hospital secondary to increased lower extremity edema, dyspnea on exertion, orthopnea and weight gain. Reported 3 to 4 days of worsening symptoms.      Demonstrating elevated BNP, signs of fluid overload on physical exam and a chest x-ray with vascular congestion/interstitial edema.  IV Lasix  was started  Echocardiograph with reduced LV systolic function and biventricular failure.  Right heart catheterization with severe RV failure, low output state, felt to be end stage. Placed on milrinone and furosemide  drip for palliative diuresis.  Developed lower gastrointestinal bleeding, diverticular or hemorrhoidal, conservative management recommended,   Placed on IV furosemide  and Milrinone for palliative diuresis  10/16 milrinone weaned off 10/17 off milrinone with good toleration.  10/18 patient medically stable to be transfer to SNF.   Assessment and Plan: * Heart failure, systolic, acute on chronic (HCC) Echocardiogram with reduced LV systolic function with EF 35 to 40%, mild dilatated LV cavity, RV systolic function with mild reduction, RV with severe enlargement, RVSP 31.8 mmHg.  LV akinetic anterior septum, mid infero septal segment and basal infero septal segment.   Urine output is 2,400 ml Systolic blood pressure 130 mmHg range   Off milrinone and transitioned to oral loop diuretic with torsemide  60 mg po daily.  Metolazone  5 mg x 2 (10/15)   Acetazolamide 500 mg bid  x2 (10/15)  Spironolactone 25 mg  po daily.  Limited medical therapy due to risk of hypotension.  Poor prognosis.   Hypertension Continue blood pressure monitoring   Chronic kidney disease, stage 3b (HCC) AKI Hyponatremia  Stable renal function with serum cr at 2,17 with K at 3,5 and serum bicarbonate at 25  Na 133 and Mg 1,9   Add 40 meq Kcl to avoid hypokalemia and 2 g mag sulfate to avoid hypomagnesemia.  Follow up renal function and electrolytes in am.   COPD (chronic obstructive pulmonary disease) (HCC) No signs of acute exacerbation Continue bronchodilator therapy Discontinue prednisone    Type 2 diabetes mellitus with hyperlipidemia (HCC) Continue glucose cover and monitoring with insulin  sliding scale  Positive hyperglycemia   Continue statin therapy  Hypothyroidism Continue levothyroxine    GERD (gastroesophageal reflux disease) Continue with pantoprazole    Protein-calorie malnutrition, moderate Nutritional supplements  History of CVA (cerebrovascular accident) Continue blood pressure monitoring   Obesity Calculated BMI 31.9 consistent with obesity class 1  Deep vein thrombosis (DVT) of both lower extremities (HCC) Patient with no further bleeding, risk vs benefit continue with anticoagulation   NASH (nonalcoholic steatohepatitis) Continue with urosidol.  Chronic anemia, with follow up hgb at 8,4   Depression Chronic pain  Continue duloxetine , as needed hydroxyzine  and pregabalin  As needed methocarbamol  and tramadol .  At bedtime trazodone          Subjective: Patient with no chest pain, no dyspnea, no PND or orthopnea   Physical Exam: Vitals:   12/03/23 2122 12/04/23 0105 12/04/23 0432 12/04/23 0804  BP:  132/72 134/73   Pulse: 92 89 94   Resp: 16 15 16    Temp:  97.9 F (36.6 C) 98.1 F (  36.7 C)   TempSrc:  Axillary Oral Oral  SpO2: 100% 100% 100%   Weight:   100.6 kg   Height:       Neurology awake and alert ENT with mild pallor Cardiovascular with S1 and S2  present and regular, with nog gallops or rubs, positive systolic murmur at the apex Respiratory with no rales or wheezing, no rhonchi Abdomen with no distention  Lower extremity trace edema unna boots in place  Data Reviewed:    Family Communication: no family at the bedside   Disposition: Status is: Inpatient Remains inpatient appropriate because: pending transfer to SNF   Planned Discharge Destination: Home    Author: Elidia Toribio Furnace, MD 12/04/2023 11:25 AM  For on call review [email or web address].

## 2023-12-04 NOTE — Progress Notes (Signed)
 Advanced Heart Failure Rounding Note  Cardiologist: Jayson Sierras, MD   Chief Complaint: Acute on chronic biventricular heart failure   Patient Profile   73 y/o female w/ chronic systolic heart failure w/ prominent RV dysfunction, hypertension, CKD IIIb, T2DM, OSA and h/o LE DVT admitted w/ a/c CHF w/ marked volume overload. Further w/u w/ RHC demonstrated severe RV dominant/ low output heart failure, felt to be end-stage. This admission requiring milrinone to assist w/ palliative diuresis.   Subjective:    Has not been OOB. Despite 60 lb weight loss still with edema Diuresed another 1.9 L's     Objective:   Weight Range: 100.6 kg Body mass index is 33.72 kg/m.   Vital Signs:   Temp:  [97.8 F (36.6 C)-98.1 F (36.7 C)] 98.1 F (36.7 C) (10/18 0432) Pulse Rate:  [89-95] 94 (10/18 0432) Resp:  [15-24] 16 (10/18 0432) BP: (120-134)/(60-73) 134/73 (10/18 0432) SpO2:  [100 %] 100 % (10/18 0432) FiO2 (%):  [28 %] 28 % (10/17 2122) Weight:  [100.6 kg] 100.6 kg (10/18 0432) Last BM Date : 12/02/23  Weight change: Filed Weights   12/02/23 0555 12/03/23 0432 12/04/23 0432  Weight: 102.8 kg 102.6 kg 100.6 kg    Intake/Output:   Intake/Output Summary (Last 24 hours) at 12/04/2023 0945 Last data filed at 12/04/2023 0600 Gross per 24 hour  Intake 480 ml  Output 2400 ml  Net -1920 ml      Physical Exam    General:  Ill appearing HEENT: scleral icterus Cor:  JVP 10-12. Regular rate & rhythm. No murmurs. Lungs: breathing nonlabored Abdomen: obese, + edema Extremities: + UNNA boots, 1-2+ edema Neuro: alert & orientedx3. Affect pleasant. + asterixis.   Telemetry   Sinus tach, low 100s, personally reviewed    Labs    CBC Recent Labs    12/01/23 1230 12/04/23 0500  WBC 8.7 10.1  HGB 8.5* 8.4*  HCT 26.7* 26.1*  MCV 84.8 84.7  PLT 117* 118*   Basic Metabolic Panel Recent Labs    89/82/74 0631 12/04/23 0500  NA 131* 133*  K 3.2* 3.5  CL 97* 98   CO2 24 25  GLUCOSE 162* 124*  BUN 37* 38*  CREATININE 2.07* 2.17*  CALCIUM  8.8* 8.8*  MG 1.9 1.9     BNP: BNP (last 3 results) Recent Labs    01/08/23 1118 02/11/23 1445 11/18/23 1745  BNP 1,482.0* 1,213* 2,302.1*    ProBNP (last 3 results) Recent Labs    11/16/23 1201  PROBNP 14,798.0*     Medications:     Scheduled Medications:  apixaban   5 mg Oral BID   arformoterol  15 mcg Nebulization BID   And   umeclidinium bromide  1 puff Inhalation Daily   vitamin C   500 mg Oral Daily   atorvastatin   80 mg Oral Daily   Chlorhexidine  Gluconate Cloth  6 each Topical Daily   cycloSPORINE  1 drop Both Eyes BID   DULoxetine   60 mg Oral Daily   insulin  aspart  0-15 Units Subcutaneous TID WC   insulin  aspart  0-5 Units Subcutaneous QHS   levothyroxine   100 mcg Oral QAC breakfast   loratadine   10 mg Oral Daily   pantoprazole   40 mg Oral Daily   pregabalin   75 mg Oral Daily   sodium chloride  flush  10-40 mL Intracatheter Q12H   sodium chloride  flush  3 mL Intravenous Q12H   spironolactone  25 mg Oral Daily  torsemide   60 mg Oral BID   traZODone   150 mg Oral QHS   ursodiol  300 mg Oral BID    Infusions:  magnesium  sulfate bolus IVPB 2 g (12/04/23 0850)    PRN Medications: acetaminophen  **OR** acetaminophen , acetaminophen , alum & mag hydroxide-simeth, hydrOXYzine , ipratropium-albuterol , lubiprostone , methocarbamol , ondansetron  **OR** ondansetron  (ZOFRAN ) IV, sodium chloride  flush, sodium chloride  flush, traMADol     Assessment/Plan   1. 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 Mild MR/TR - RHC RA 27, PA 48/26, PCW 22, PVR 2.2, TD CI 1.8, PAPi 0.81>>Likely end-stage physiology  - not candidate for transplant given age, obesity and other co morbidities - not candidate for LVAD given severity of RV failure - significant wt loss from  diuresis. Continues w/ MSOF w/ both renal and hepatic involvement  - CO-OX 75% milrinone d/c Less tremulous  - Oral diuretics  Torsemide  60 mg BID (80 daily prior to admit). - Continue spiro 25 mg daily - No beta blocker with low-output HF - GDMT limited by CKD - We've had multiple discussions with the patient regarding severity of illness/ refractory HF despite current support and recommended continued GOC discussion w/ Palliative Care and family. He has been resistant to considering transition to hospice. Remains full code.  2. AKI on CKD IIIb - Cr 2.17 stable  - suspect cardiorenal/low output     3. CAD  - coronary artery classifications by CT imaging - no plans currently for [facility] given renal dysfunction and absence of ischemic CP  - continue statin    4. HTN - BP controlled    5. 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 Anemia/ Thrombocytopenia  Lower GI bleeding - suspect anemia of chronic disease d/t renal/hepatic dysfunction  - hgb slowly drifted down, now stable in 8s - has been seen by GI, no plans for endoscopy - Eliquis  had been held, now adding back per primary as she is no longer having bleeding  7. Chronic liver disease - In setting of advanced biventricular heart failure - Asterixis on exam.   On oral meds OOB waiting on rehab SNF placement   Length of Stay: 18  Maude Emmer, MD  12/04/2023, 9:45 AM

## 2023-12-04 NOTE — Assessment & Plan Note (Signed)
 Chronic pain  Continue duloxetine , as needed hydroxyzine  and pregabalin  As needed methocarbamol  and tramadol .  At bedtime trazodone 

## 2023-12-04 NOTE — TOC Progression Note (Addendum)
 Transition of Care [facility]) - Progression Note    Patient Details  Name: Stephanie [name] MRN: [id number] Date of Birth: 1950/05/18  [facility] CM/SW Contact  Luise JAYSON Pan, [place] Phone Number: 12/04/2023, 10:37 AM  Clinical Narrative:   CSW unable to see authorization information in navi portal. CSW to call navi with [facility].   Per [facility], patients authorization has not been started. CSW to submit for auth.   11:12 AM EDD set for 10/20. CSW submitted for insurance auth; Auth id [id number]. Approval dates 12/05/2023-12/08/2023.   11:54 AM [facility] can accept patient tomorrow when bed is ready. CSW notified MD and bedside RN.   CSW will continue to follow.    Expected Discharge Plan: Skilled Nursing Facility Barriers to Discharge: Continued Medical Work up               Expected Discharge Plan and Services In-house Referral: Clinical Social Work   Post Acute Care Choice: Skilled Nursing Facility Living arrangements for the past 2 months: Single Family Home                                       Social Drivers of Health (SDOH) Interventions SDOH Screenings   Food Insecurity: No Food Insecurity (11/17/2023)  Housing: Low Risk  (11/17/2023)  Transportation Needs: No Transportation Needs (11/17/2023)  Utilities: Not At Risk (11/17/2023)  Depression (PHQ2-9): Low Risk  (09/14/2023)  Social Connections: Moderately Isolated (11/17/2023)  Tobacco Use: Medium Risk (11/16/2023)    Readmission Risk Interventions     No data to display

## 2023-12-05 DIAGNOSIS — I5023 Acute on chronic systolic (congestive) heart failure: Secondary | ICD-10-CM | POA: Diagnosis not present

## 2023-12-05 DIAGNOSIS — N1832 Chronic kidney disease, stage 3b: Secondary | ICD-10-CM | POA: Diagnosis not present

## 2023-12-05 DIAGNOSIS — Z515 Encounter for palliative care: Secondary | ICD-10-CM | POA: Diagnosis not present

## 2023-12-05 DIAGNOSIS — I10 Essential (primary) hypertension: Secondary | ICD-10-CM | POA: Diagnosis not present

## 2023-12-05 DIAGNOSIS — Z7189 Other specified counseling: Secondary | ICD-10-CM | POA: Diagnosis not present

## 2023-12-05 DIAGNOSIS — I509 Heart failure, unspecified: Secondary | ICD-10-CM | POA: Diagnosis not present

## 2023-12-05 DIAGNOSIS — J449 Chronic obstructive pulmonary disease, unspecified: Secondary | ICD-10-CM | POA: Diagnosis not present

## 2023-12-05 LAB — RENAL FUNCTION PANEL
Albumin: 2.3 g/dL — ABNORMAL LOW (ref 3.5–5.0)
Anion gap: 10 (ref 5–15)
BUN: 42 mg/dL — ABNORMAL HIGH (ref 8–23)
CO2: 26 mmol/L (ref 22–32)
Calcium: 9.3 mg/dL (ref 8.9–10.3)
Chloride: 98 mmol/L (ref 98–111)
Creatinine, Ser: 2.11 mg/dL — ABNORMAL HIGH (ref 0.44–1.00)
GFR, Estimated: 24 mL/min — ABNORMAL LOW (ref >60)
Glucose, Bld: 145 mg/dL — ABNORMAL HIGH (ref 70–99)
Phosphorus: 3 mg/dL (ref 2.5–4.6)
Potassium: 3.7 mmol/L (ref 3.5–5.1)
Sodium: 134 mmol/L — ABNORMAL LOW (ref 135–145)

## 2023-12-05 LAB — GLUCOSE, CAPILLARY
Glucose-Capillary: 130 mg/dL — ABNORMAL HIGH (ref 70–99)
Glucose-Capillary: 159 mg/dL — ABNORMAL HIGH (ref 70–99)

## 2023-12-05 MED ORDER — POTASSIUM CHLORIDE CRYS ER 20 MEQ PO TBCR
40.0000 meq | EXTENDED_RELEASE_TABLET | Freq: Once | ORAL | Status: AC
  Administered 2023-12-05: 40 meq via ORAL
  Filled 2023-12-05: qty 2

## 2023-12-05 NOTE — Plan of Care (Signed)

## 2023-12-05 NOTE — Progress Notes (Addendum)
 Progress Note   Patient: Stephanie [name] FMW:[id number] DOB: [birth year] DOA: 11/16/2023     19 DOS: the patient was seen and examined on 12/05/2023   Brief hospital course: Stephanie [name] was admitted to the hospital with the working diagnosis of heart failure exacerbation.   73 y.o. female with medical history significant of systolic heart failure, hypothyroidism, hyperlipidemia, COPD/asthma, history of DVT, fibromyalgia, GERD and type 2 diabetes mellitus with nephropathy; who presented to the hospital secondary to increased lower extremity edema, dyspnea on exertion, orthopnea and weight gain. Reported 3 to 4 days of worsening symptoms.      Demonstrating elevated BNP, signs of fluid overload on physical exam and a chest x-ray with vascular congestion/interstitial edema.  IV Lasix  was started  Echocardiograph with reduced LV systolic function and biventricular failure.  Right heart catheterization with severe RV failure, low output state, felt to be end stage. Placed on milrinone and furosemide  drip for palliative diuresis.  Developed lower gastrointestinal bleeding, diverticular or hemorrhoidal, conservative management recommended,   Placed on IV furosemide  and Milrinone for palliative diuresis  10/16 milrinone weaned off 10/17 off milrinone with good toleration.  10/18 patient medically stable to be transfer to SNF.   Assessment and Plan: * Heart failure, systolic, acute on chronic (HCC) Echocardiogram with reduced LV systolic function with EF 35 to 40%, mild dilatated LV cavity, RV systolic function with mild reduction, RV with severe enlargement, RVSP 31.8 mmHg.  LV akinetic anterior septum, mid infero septal segment and basal infero septal segment.   Urine output is 2,490 ml Systolic blood pressure 130 mmHg range   Off milrinone Metolazone  5 mg x 2 (10/15)   Acetazolamide 500 mg bid  x2 (10/15)   Continue with torsemide  60 mg po bid Spironolactone 25 mg po daily.  Limited medical  therapy due to risk of hypotension.  Poor prognosis.  Ok to discontinue telemetry and remove picc line   Hypertension Continue blood pressure monitoring   Chronic kidney disease, stage 3b (HCC) AKI Hyponatremia  Continue stable renal function and electrolytes with serum cr at 2,11 with K at 3,7 and serum bicarbonate at 26  Na 134 and P 3.0   Add 40 meq Kcl to avoid hypokalemia  Follow up renal function in 48 hrs   COPD (chronic obstructive pulmonary disease) (HCC) No signs of acute exacerbation Continue bronchodilator therapy Discontinue prednisone    Type 2 diabetes mellitus with hyperlipidemia (HCC) Positive hyperglycemia has resolved.  Will discontinue insulin  therapy.  Check CBG only as needed.   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  Chronic anemia, with follow up hgb at 8,4   Obesity Calculated BMI 31.9 consistent with obesity class 1  Depression Chronic pain  Continue duloxetine , as needed hydroxyzine  and pregabalin  As needed methocarbamol  and tramadol .  At bedtime trazodone       Subjective: Patient with no chest pain or dyspnea, she has been out of the bed to the chair   Physical Exam: Vitals:   12/04/23 2346 12/05/23 0418 12/05/23 0753 12/05/23 0810  BP: 117/70 122/62 (!) 147/81   Pulse: 93 95    Resp: 20 15    Temp: 97.9 F (  36.6 C) 98.1 F (36.7 C) 97.9 F (36.6 C)   TempSrc: Oral Oral Oral   SpO2: 100% 100%  100%  Weight:  92.2 kg    Height:       Neurology awake and alert ENT with mild  pallor Cardiovascular with S1 and S2 present and regular with no gallops, no murmurs Respiratory with no rales or wheezing, no rhonchi  Abdomen with no distention  No lower extremity edema, unna boots in place  Data Reviewed:    Family Communication: no family at the bedside   Disposition: Status is: Inpatient Remains inpatient appropriate because: pending transfer to SNF   Planned Discharge Destination: Skilled nursing facility     Author: Elidia Toribio Furnace, MD 12/05/2023 10:29 AM  For on call review [email or web address].

## 2023-12-05 NOTE — Progress Notes (Signed)
 Palliative Medicine Inpatient Follow Up Note   HPI: 73 y.o. female with medical history significant of systolic heart failure, hypothyroidism, hyperlipidemia, COPD/asthma, history of DVT, fibromyalgia, GERD and type 2 diabetes mellitus with nephropathy; who presented to the hospital secondary to increased lower extremity swelling, shortness of breath with activity/orthopnea and weight gain.  Palliative care has been asked to support additional goals of care conversations in the setting of heart failure - NYHA Class IV.    Today's Discussion 12/05/2023  *Please note that this is a verbal dictation therefore any spelling or grammatical errors are due to the Dragon [employer] One system interpretation.  Chart reviewed inclusive of vital signs, progress notes, laboratory results, and diagnostic images.   Created space and opportunity for patient to explore thoughts feelings and fears regarding current medical situation.  The patient was seen at bedside today; no family members were present. She remains awake, alert, and oriented. While chronically ill, she is not in acute distress and denies any pain or discomfort. Her overall condition appears stable. She reported feeling better than in previous days and expressed eagerness about her upcoming discharge, pending bed availability at the nursing facility.  She has successfully discontinued Milrinone with good tolerance. We revisited her goals of care, and she reaffirmed her preference to maintain full code status. Given her significant disease burden and current readiness, I recommended outpatient palliative care for additional support, as she is not yet prepared to transition to hospice services. She showed interest in pursuing palliative care. I offered to contact her daughters with an update, but she declined, stating it was not necessary.  All questions and concerns were addressed during the visit.  Patient  face treatment option decisions,  advanced directive decisions and anticipatory care needs.   Palliative Support Provided.   Objective Assessment: Vital Signs Vitals:   12/05/23 0810 12/05/23 1245  BP:  125/61  Pulse:    Resp:    Temp:  97.7 F (36.5 C)  SpO2: 100%     Intake/Output Summary (Last 24 hours) at 12/05/2023 1641 Last data filed at 12/05/2023 1248 Gross per 24 hour  Intake 268 ml  Output 3540 ml  Net -3272 ml   Last Weight  Most recent update: 12/05/2023  5:03 AM    Weight  92.2 kg (203 lb 3.2 oz)             Gen:   Obese, Chronically ill-appearing HEENT: moist mucous membranes CV: Regular rate and rhythm  PULM: On 2 L nasal cannula breathing is even and nonlabored ABD: soft/nontender  EXT: Generalized edema/+ anasarca in all extremities Neuro: Alert and oriented x3   SUMMARY OF RECOMMENDATIONS   Full code/Full scope of care  Patient is medically ready for discharge to SNF, awaiting for bed availability Patient agreeable with outpatient palliative services given life-limiting illness/high disease burden. TOC consult placed to assist with outpatient palliative referral.  Spiritual consult placed to assist with Advance Directive creation The PMT will continue following along and offering support  Time Spent: 35 minutes  Detailed review of medical records (labs, imaging, vital signs), medically appropriate exam, discussed with treatment team, counseling and education to patient, family, & staff, documenting clinical information, coordination of care.   ______________________________________________________________________________________ Kathlyne Bolder NP-C [facility]iative [facility] Team Team Cell Phone: [phone] Please utilize secure chat with additional questions, if there is no response within 30 minutes please call the above phone number  Palliative Medicine Team providers are available by phone from 7am  to 7pm daily and can be reached through the team cell phone.   Should this patient require assistance outside of these hours, please call the patient's attending physician.

## 2023-12-06 DIAGNOSIS — I10 Essential (primary) hypertension: Secondary | ICD-10-CM | POA: Diagnosis not present

## 2023-12-06 DIAGNOSIS — D649 Anemia, unspecified: Secondary | ICD-10-CM

## 2023-12-06 DIAGNOSIS — J449 Chronic obstructive pulmonary disease, unspecified: Secondary | ICD-10-CM | POA: Diagnosis not present

## 2023-12-06 DIAGNOSIS — I82443 Acute embolism and thrombosis of tibial vein, bilateral: Secondary | ICD-10-CM

## 2023-12-06 DIAGNOSIS — N1832 Chronic kidney disease, stage 3b: Secondary | ICD-10-CM | POA: Diagnosis not present

## 2023-12-06 DIAGNOSIS — K7581 Nonalcoholic steatohepatitis (NASH): Secondary | ICD-10-CM

## 2023-12-06 DIAGNOSIS — I5023 Acute on chronic systolic (congestive) heart failure: Secondary | ICD-10-CM | POA: Diagnosis not present

## 2023-12-06 LAB — CBC
HCT: 29.4 % — ABNORMAL LOW (ref 36.0–46.0)
Hemoglobin: 9.1 g/dL — ABNORMAL LOW (ref 12.0–15.0)
MCH: 27.3 pg (ref 26.0–34.0)
MCHC: 31 g/dL (ref 30.0–36.0)
MCV: 88.3 fL (ref 80.0–100.0)
Platelets: 124 K/uL — ABNORMAL LOW (ref 150–400)
RBC: 3.33 MIL/uL — ABNORMAL LOW (ref 3.87–5.11)
RDW: 16.6 % — ABNORMAL HIGH (ref 11.5–15.5)
WBC: 10.2 K/uL (ref 4.0–10.5)
nRBC: 0 % (ref 0.0–0.2)

## 2023-12-06 MED ORDER — LORATADINE 10 MG PO TABS: 10.0000 mg | ORAL_TABLET | Freq: Every day | ORAL | Status: DC

## 2023-12-06 MED ORDER — PREGABALIN 75 MG PO CAPS: 75.0000 mg | ORAL_CAPSULE | Freq: Every day | ORAL | 0 refills | Status: DC

## 2023-12-06 MED ORDER — ATORVASTATIN CALCIUM 80 MG PO TABS
80.0000 mg | ORAL_TABLET | Freq: Every day | ORAL | Status: DC
Start: 2023-12-06 — End: 2023-12-06

## 2023-12-06 MED ORDER — HYDROXYZINE HCL 25 MG PO TABS: 25.0000 mg | ORAL_TABLET | Freq: Three times a day (TID) | ORAL | 0 refills | Status: DC | PRN

## 2023-12-06 MED ORDER — PANTOPRAZOLE SODIUM 40 MG PO TBEC: 40.0000 mg | DELAYED_RELEASE_TABLET | Freq: Every day | ORAL | Status: DC

## 2023-12-06 MED ORDER — TORSEMIDE 60 MG PO TABS: 60.0000 mg | ORAL_TABLET | Freq: Two times a day (BID) | ORAL | 0 refills | Status: DC

## 2023-12-06 MED ORDER — VITAMIN C 500 MG PO TABS: 500.0000 mg | ORAL_TABLET | Freq: Every day | ORAL | Status: DC

## 2023-12-06 MED ORDER — TRAMADOL HCL 50 MG PO TABS: 50.0000 mg | ORAL_TABLET | Freq: Three times a day (TID) | ORAL | 0 refills | Status: DC | PRN

## 2023-12-06 NOTE — Care Management Important Message (Signed)
 Important Message  Patient Details  Name: Stephanie [name] MRN: [id number] Date of Birth: 1951/02/05   Important Message Given:  Yes - Medicare IM     [name] 12/06/2023, 10:56 AM

## 2023-12-06 NOTE — Plan of Care (Signed)
 Problem: Education: Goal: Ability to describe self-care measures that may prevent or decrease complications (Diabetes Survival Skills Education) will improve 12/06/2023 1248 by Marylu Randine NOVAK, RN Outcome: Adequate for Discharge 12/06/2023 1121 by Marylu Randine NOVAK, RN Outcome: Progressing Goal: Individualized Educational Video(s) 12/06/2023 1248 by Marylu Randine NOVAK, RN Outcome: Adequate for Discharge 12/06/2023 1121 by Marylu Randine NOVAK, RN Outcome: Progressing   Problem: Coping: Goal: Ability to adjust to condition or change in health will improve 12/06/2023 1248 by Marylu Randine NOVAK, RN Outcome: Adequate for Discharge 12/06/2023 1121 by Marylu Randine NOVAK, RN Outcome: Progressing   Problem: Fluid Volume: Goal: Ability to maintain a balanced intake and output will improve 12/06/2023 1248 by Marylu Randine NOVAK, RN Outcome: Adequate for Discharge 12/06/2023 1121 by Marylu Randine NOVAK, RN Outcome: Progressing   Problem: Health Behavior/Discharge Planning: Goal: Ability to identify and utilize available resources and services will improve 12/06/2023 1248 by Marylu Randine NOVAK, RN Outcome: Adequate for Discharge 12/06/2023 1121 by Marylu Randine NOVAK, RN Outcome: Progressing Goal: Ability to manage health-related needs will improve 12/06/2023 1248 by Marylu Randine NOVAK, RN Outcome: Adequate for Discharge 12/06/2023 1121 by Marylu Randine NOVAK, RN Outcome: Progressing   Problem: Metabolic: Goal: Ability to maintain appropriate glucose levels will improve 12/06/2023 1248 by Marylu Randine NOVAK, RN Outcome: Adequate for Discharge 12/06/2023 1121 by Marylu Randine NOVAK, RN Outcome: Progressing   Problem: Nutritional: Goal: Maintenance of adequate nutrition will improve 12/06/2023 1248 by Marylu Randine NOVAK, RN Outcome: Adequate for Discharge 12/06/2023 1121 by Marylu Randine NOVAK, RN Outcome: Progressing Goal: Progress toward achieving an optimal weight will improve 12/06/2023 1248 by Marylu Randine NOVAK, RN Outcome:  Adequate for Discharge 12/06/2023 1121 by Marylu Randine NOVAK, RN Outcome: Progressing   Problem: Skin Integrity: Goal: Risk for impaired skin integrity will decrease 12/06/2023 1248 by Marylu Randine NOVAK, RN Outcome: Adequate for Discharge 12/06/2023 1121 by Marylu Randine NOVAK, RN Outcome: Progressing   Problem: Tissue Perfusion: Goal: Adequacy of tissue perfusion will improve 12/06/2023 1248 by Marylu Randine NOVAK, RN Outcome: Adequate for Discharge 12/06/2023 1121 by Marylu Randine NOVAK, RN Outcome: Progressing   Problem: Education: Goal: Knowledge of General Education information will improve Description: Including pain rating scale, medication(s)/side effects and non-pharmacologic comfort measures 12/06/2023 1248 by Marylu Randine NOVAK, RN Outcome: Adequate for Discharge 12/06/2023 1121 by Marylu Randine NOVAK, RN Outcome: Progressing   Problem: Health Behavior/Discharge Planning: Goal: Ability to manage health-related needs will improve 12/06/2023 1248 by Marylu Randine NOVAK, RN Outcome: Adequate for Discharge 12/06/2023 1121 by Marylu Randine NOVAK, RN Outcome: Progressing   Problem: Clinical Measurements: Goal: Ability to maintain clinical measurements within normal limits will improve 12/06/2023 1248 by Marylu Randine NOVAK, RN Outcome: Adequate for Discharge 12/06/2023 1121 by Marylu Randine NOVAK, RN Outcome: Progressing Goal: Will remain free from infection 12/06/2023 1248 by Marylu Randine NOVAK, RN Outcome: Adequate for Discharge 12/06/2023 1121 by Marylu Randine NOVAK, RN Outcome: Progressing Goal: Diagnostic test results will improve 12/06/2023 1248 by Marylu Randine NOVAK, RN Outcome: Adequate for Discharge 12/06/2023 1121 by Marylu Randine NOVAK, RN Outcome: Progressing Goal: Respiratory complications will improve 12/06/2023 1248 by Marylu Randine NOVAK, RN Outcome: Adequate for Discharge 12/06/2023 1121 by Marylu Randine NOVAK, RN Outcome: Progressing Goal: Cardiovascular complication will be avoided 12/06/2023 1248  by Marylu Randine NOVAK, RN Outcome: Adequate for Discharge 12/06/2023 1121 by Marylu Randine NOVAK, RN Outcome: Progressing   Problem: Activity: Goal: Risk for activity intolerance will decrease 12/06/2023 1248 by Marylu Randine NOVAK, RN Outcome: Adequate for Discharge  12/06/2023 1121 by Marylu Randine NOVAK, RN Outcome: Progressing   Problem: Nutrition: Goal: Adequate nutrition will be maintained 12/06/2023 1248 by Marylu Randine NOVAK, RN Outcome: Adequate for Discharge 12/06/2023 1121 by Marylu Randine NOVAK, RN Outcome: Progressing   Problem: Coping: Goal: Level of anxiety will decrease 12/06/2023 1248 by Marylu Randine NOVAK, RN Outcome: Adequate for Discharge 12/06/2023 1121 by Marylu Randine NOVAK, RN Outcome: Progressing   Problem: Elimination: Goal: Will not experience complications related to bowel motility 12/06/2023 1248 by Marylu Randine NOVAK, RN Outcome: Adequate for Discharge 12/06/2023 1121 by Marylu Randine NOVAK, RN Outcome: Progressing Goal: Will not experience complications related to urinary retention 12/06/2023 1248 by Marylu Randine NOVAK, RN Outcome: Adequate for Discharge 12/06/2023 1121 by Marylu Randine NOVAK, RN Outcome: Progressing   Problem: Pain Managment: Goal: General experience of comfort will improve and/or be controlled 12/06/2023 1248 by Marylu Randine NOVAK, RN Outcome: Adequate for Discharge 12/06/2023 1121 by Marylu Randine NOVAK, RN Outcome: Progressing   Problem: Safety: Goal: Ability to remain free from injury will improve 12/06/2023 1248 by Marylu Randine NOVAK, RN Outcome: Adequate for Discharge 12/06/2023 1121 by Marylu Randine NOVAK, RN Outcome: Progressing   Problem: Skin Integrity: Goal: Risk for impaired skin integrity will decrease 12/06/2023 1248 by Marylu Randine NOVAK, RN Outcome: Adequate for Discharge 12/06/2023 1121 by Marylu Randine NOVAK, RN Outcome: Progressing   Problem: Education: Goal: Ability to demonstrate management of disease process will improve 12/06/2023 1248 by Marylu Randine NOVAK, RN Outcome: Adequate for Discharge 12/06/2023 1121 by Marylu Randine NOVAK, RN Outcome: Progressing Goal: Ability to verbalize understanding of medication therapies will improve 12/06/2023 1248 by Marylu Randine NOVAK, RN Outcome: Adequate for Discharge 12/06/2023 1121 by Marylu Randine NOVAK, RN Outcome: Progressing Goal: Individualized Educational Video(s) 12/06/2023 1248 by Marylu Randine NOVAK, RN Outcome: Adequate for Discharge 12/06/2023 1121 by Marylu Randine NOVAK, RN Outcome: Progressing   Problem: Activity: Goal: Capacity to carry out activities will improve 12/06/2023 1248 by Marylu Randine NOVAK, RN Outcome: Adequate for Discharge 12/06/2023 1121 by Marylu Randine NOVAK, RN Outcome: Progressing   Problem: Cardiac: Goal: Ability to achieve and maintain adequate cardiopulmonary perfusion will improve 12/06/2023 1248 by Marylu Randine NOVAK, RN Outcome: Adequate for Discharge 12/06/2023 1121 by Marylu Randine NOVAK, RN Outcome: Progressing   Problem: Education: Goal: Understanding of CV disease, CV risk reduction, and recovery process will improve 12/06/2023 1248 by Marylu Randine NOVAK, RN Outcome: Adequate for Discharge 12/06/2023 1121 by Marylu Randine NOVAK, RN Outcome: Progressing Goal: Individualized Educational Video(s) 12/06/2023 1248 by Marylu Randine NOVAK, RN Outcome: Adequate for Discharge 12/06/2023 1121 by Marylu Randine NOVAK, RN Outcome: Progressing   Problem: Activity: Goal: Ability to return to baseline activity level will improve 12/06/2023 1248 by Marylu Randine NOVAK, RN Outcome: Adequate for Discharge 12/06/2023 1121 by Marylu Randine NOVAK, RN Outcome: Progressing   Problem: Cardiovascular: Goal: Ability to achieve and maintain adequate cardiovascular perfusion will improve 12/06/2023 1248 by Marylu Randine NOVAK, RN Outcome: Adequate for Discharge 12/06/2023 1121 by Marylu Randine NOVAK, RN Outcome: Progressing Goal: Vascular access site(s) Level 0-1 will be maintained 12/06/2023 1248 by Marylu Randine NOVAK,  RN Outcome: Adequate for Discharge 12/06/2023 1121 by Marylu Randine NOVAK, RN Outcome: Progressing   Problem: Health Behavior/Discharge Planning: Goal: Ability to safely manage health-related needs after discharge will improve 12/06/2023 1248 by Marylu Randine NOVAK, RN Outcome: Adequate for Discharge 12/06/2023 1121 by Marylu Randine NOVAK, RN Outcome: Progressing

## 2023-12-06 NOTE — Progress Notes (Signed)
 Physical Therapy Treatment Patient Details Name: Stephanie [name] MRN: [id number] DOB: [birth year] Today's Date: 12/06/2023   History of Present Illness 73 y.o. female who presented 9/30 with lower extremity swelling, SOB, and weight gain. Dx acute on chronic systolic heart failure. PMH includes: systolic heart failure, hypothyroidism, hyperlipidemia, COPD/asthma, history of DVT, fibromyalgia, GERD and type 2 diabetes mellitus with nephropathy.    PT Comments  The pt was agreeable to session, agreeable to OOB mobility and LE strengthening exercises. The pt was able to complete sit-stand transfers with less assistance at this time, and demos improved stability with static standing and small lateral steps. Further gait progression limited by fatigue this morning, but pt able to complete x3 sit-stands in a row to work on LE strength and power. Recommendations remain appropriate, will continue to follow acutely.    If plan is discharge home, recommend the following: A lot of help with walking and/or transfers;A lot of help with bathing/dressing/bathroom;Assistance with cooking/housework   Can travel by private vehicle     No  Equipment Recommendations  Other (comment) (TBD)    Recommendations for Other Services       Precautions / Restrictions Precautions Precautions: Fall Recall of Precautions/Restrictions: Intact Restrictions Weight Bearing Restrictions Per Provider Order: No     Mobility  Bed Mobility Overal bed mobility: Needs Assistance Bed Mobility: Supine to Sit     Supine to sit: Min assist, HOB elevated, Used rails     General bed mobility comments: minimal truncal assist up via L elbow with minimal assist of LE's off the bed.  LE's assist into bed.    Transfers Overall transfer level: Needs assistance Equipment used: Rolling walker (2 wheels) Transfers: Sit to/from Stand, Bed to chair/wheelchair/BSC Sit to Stand: Min assist, +2 safety/equipment   Step pivot transfers:  Min assist, +2 safety/equipment       General transfer comment: cues for hand placement, slow to rise with inreased trunk flexion, but pt powering up to standing with minA of 1 and +2 for safety only, small pivotal steps to recliner.    Ambulation/Gait               General Gait Details: limited to ~6 lateral steps to chair, limited by fatigue, heavy dependence on BUE support      Balance Overall balance assessment: Needs assistance Sitting-balance support: No upper extremity supported, Feet supported Sitting balance-Leahy Scale: Good   Postural control: Posterior lean Standing balance support: Bilateral upper extremity supported, During functional activity, Reliant on assistive device for balance Standing balance-Leahy Scale: Poor Standing balance comment: dependent on BUE support, no overt buckling or LOB                            Communication Communication Communication: No apparent difficulties  Cognition Arousal: Alert Behavior During Therapy: Flat affect, WFL for tasks assessed/performed   PT - Cognitive impairments: No apparent impairments                         Following commands: Intact      Cueing Cueing Techniques: Verbal cues  Exercises Other Exercises Other Exercises: sit-stand from recliner x3    General Comments General comments (skin integrity, edema, etc.): VSS in session      Pertinent Vitals/Pain Pain Assessment Pain Assessment: No/denies pain Pain Intervention(s): Monitored during session     PT Goals (current goals can now be found in the  care plan section) Acute Rehab PT Goals Patient Stated Goal: Get well PT Goal Formulation: With patient Time For Goal Achievement: 12/20/23 Potential to Achieve Goals: Good Progress towards PT goals: Progressing toward goals    Frequency    Min 2X/week       AM-PAC PT 6 Clicks Mobility   Outcome Measure  Help needed turning from your back to your side while in  a flat bed without using bedrails?: A Little Help needed moving from lying on your back to sitting on the side of a flat bed without using bedrails?: A Lot Help needed moving to and from a bed to a chair (including a wheelchair)?: A Lot Help needed standing up from a chair using your arms (e.g., wheelchair or bedside chair)?: A Lot Help needed to walk in hospital room?: Total Help needed climbing 3-5 steps with a railing? : Total 6 Click Score: 11    End of Session Equipment Utilized During Treatment: Oxygen ;Gait belt Activity Tolerance: Patient tolerated treatment well;Patient limited by pain Patient left: with call bell/phone within reach;in chair;with chair alarm set Nurse Communication: Mobility status PT Visit Diagnosis: Unsteadiness on feet (R26.81);Other abnormalities of gait and mobility (R26.89);Muscle weakness (generalized) (M62.81);Difficulty in walking, not elsewhere classified (R26.2)     Time: 8952-8889 PT Time Calculation (min) (ACUTE ONLY): 23 min  Charges:    $Therapeutic Exercise: 8-22 mins $Therapeutic Activity: 8-22 mins PT General Charges $$ ACUTE PT VISIT: 1 Visit                     Izetta Call, PT, DPT   Acute Rehabilitation Department Office [phone] Secure Chat Communication Preferred   Izetta JULIANNA Call 12/06/2023, 12:46 PM

## 2023-12-06 NOTE — Plan of Care (Signed)
  Problem: Clinical Measurements: Goal: Respiratory complications will improve Outcome: Progressing Goal: Cardiovascular complication will be avoided Outcome: Progressing   Problem: Nutrition: Goal: Adequate nutrition will be maintained Outcome: Progressing   Problem: Coping: Goal: Level of anxiety will decrease Outcome: Progressing   Problem: Safety: Goal: Ability to remain free from injury will improve Outcome: Progressing   Problem: Cardiac: Goal: Ability to achieve and maintain adequate cardiopulmonary perfusion will improve Outcome: Progressing   Problem: Cardiovascular: Goal: Ability to achieve and maintain adequate cardiovascular perfusion will improve Outcome: Progressing

## 2023-12-06 NOTE — Discharge Summary (Addendum)
 Physician Discharge Summary   Patient: Stephanie [name] MRN: [id number] DOB: [birth year]  Admit date:     11/16/2023  Discharge date: 12/06/23  Discharge Physician: Elidia Sieving Norinne Jeane   PCP: Roni, [facility]   Recommendations at discharge:    Patient with end stage heart failure, plan to continue spironolactone and torsemide , limited medical therapy due to hypotension and reduced GFR Plan to follow up with Palliative Care as outpatient Follow up renal function and electrolytes in 7 days as outpatient Follow up with Primary Care in 7 to 10 days Follow up with Cardiology, heart failure, as scheduled.   I spoke with patient's daughter over the phone, we talked in detail about patient's condition, plan of care and prognosis and all questions were addressed.   Discharge Diagnoses: Principal Problem:   Heart failure, systolic, acute on chronic (HCC) Active Problems:   Hypertension   Chronic kidney disease, stage 3b (HCC)   COPD (chronic obstructive pulmonary disease) (HCC)   Type 2 diabetes mellitus with hyperlipidemia (HCC)   Hypothyroidism   GERD (gastroesophageal reflux disease)   Protein-calorie malnutrition, moderate   History of CVA (cerebrovascular accident)   Deep vein thrombosis (DVT) of both lower extremities (HCC)   NASH (nonalcoholic steatohepatitis)   Obesity   Depression  Resolved Problems:   * No resolved hospital problems. [facility] Course: [name] was admitted to the hospital with the working diagnosis of heart failure exacerbation.   73 y.o. female with medical history significant of systolic heart failure, hypothyroidism, hyperlipidemia, COPD/asthma, history of DVT, fibromyalgia, GERD and type 2 diabetes mellitus with nephropathy; who presented to the hospital secondary to increased lower extremity edema, dyspnea on exertion, orthopnea and weight gain. Reported 3 to 4 days of worsening symptoms.    On her initial physical examination with blood  pressure 148/100, HR 85, RR 10 and 02 saturation 98% Lungs with bilateral rales, heart S1 and S2 present and regular, abdomen with no distention, positive lower extremity edema +++.  Na 130, K 4.5 Cl 95 bicarbonate 21 glucose 159 bun 25 cr 1.34  AST 39 ALT 30  BNP 14,798 High sensitive troponin 164  Wbc 6.7 hgb 14.2 plt 240  Urine analysis SG 1.006, protein 30, negative leukocytes and negative hgb   Chest radiograph with cardiomegaly, bilateral hilar vascular congestion, bilateral central interstitial infiltrates, bilateral cephalization of the vasculature.   EKG 79 bpm, normal axis, qtc 523, sinus rhythm with PAC, no significant ST segment or T wave changes, low voltage    Echocardiograph with reduced LV systolic function and biventricular failure.  Patient was placed on IV furosemide  IV for diuresis.  10/02 transferred to [facility]  10/10 Right heart catheterization with severe RV failure, low output state, felt to be end stage.  Placed on milrinone and furosemide  drip for palliative diuresis.  Developed lower gastrointestinal bleeding, diverticular or hemorrhoidal, conservative management recommended,  10/16 milrinone weaned off 10/17 off milrinone with good toleration.  10/18 patient medically stable to be transfer to SNF.   Assessment and Plan: * Heart failure, systolic, acute on chronic (HCC) Echocardiogram with reduced LV systolic function with EF 35 to 40%, mild dilatated LV cavity, RV systolic function with mild reduction, RV with severe enlargement, RVSP 31.8 mmHg.  LV akinetic anterior septum, mid infero septal segment and basal infero septal segment.   10/10 cardiac catheterization  RA 27  RV 52/26  PA 48/26 mean 36  PCWP 22  Cardiac output 6.3 and index  2,8 (Fick) Cardiac output 4.0 and index 1.8 (thermodilution)   Patient was placed on IV milrinone and IV furosemide , oral metolazone  and acetazolamide for diuresis, (palliative interventions due to end stage  physiology), negative fluid balance was achieved, -16,497 ml, with significant improvement in her symptoms.   Continue with torsemide  60 mg po bid Spironolactone 25 mg po daily.  Limited medical therapy due to risk of hypotension.  Poor prognosis, patient will follow up with palliative care as outpatient.   Hypertension Continue blood pressure monitoring   Chronic kidney disease, stage 3b (HCC) AKI Hyponatremia hypokalemia   At the time of discharge her renal function has a serum cr of 2,1 with K at 3,7 and serum bicarbonate at 26  Na 134 and P 3.0   Plan to continue diuresis with torsemide  and spironolactone.  Follow up renal function and electrolytes as outpatient.   COPD (chronic obstructive pulmonary disease) (HCC) No signs of acute exacerbation Continue bronchodilator therapy Discontinue prednisone    Type 2 diabetes mellitus with hyperlipidemia (HCC) Positive hyperglycemia has resolved.  discontinued insulin  therapy.    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  Chronic anemia, with follow up hgb at 8,4   Obesity Calculated BMI 31.9 consistent with obesity class 1  Depression Chronic pain  Continue duloxetine , as needed hydroxyzine  and pregabalin  As needed methocarbamol  and tramadol .  At bedtime trazodone    Chronic anemia Patient was evaluated by GI, due to small volume rectal bleeding.  Apixaban  was held and had close follow up on cell count.  It was recommended conservative management and daily pantoprazole .  Acute  blood loss anemia ruled out.  Patient was resumed on apixaban  with good toleration Discharge hgb is 9.1       Consultants: cardiology  Procedures performed: cardiac catheterization   Disposition: Skilled nursing facility Diet recommendation:  Cardiac and Carb modified diet DISCHARGE MEDICATION: Allergies as of 12/06/2023       Reactions   Tetracyclines & Related Anaphylaxis, Rash   Banana Hives, Nausea And Vomiting   Jardiance  [empagliflozin ]    Yeast infections, fatigue   Penicillins Rash, Other (See Comments)        Medication List     STOP taking these medications    Arnuity Ellipta  100 MCG/ACT Aepb Generic drug: Fluticasone  Furoate   bisoprolol  10 MG tablet Commonly known as: ZEBETA    Entresto  24-26 MG Generic drug: sacubitril-valsartan       TAKE these medications    Accu-Chek FastClix Lancets Misc Use 1 lancet to check blood glucose four (4) times daily as directed by provider.   Accu-Chek Guide Control Liqd See admin instructions.   Accu-Chek Guide Me w/Device Kit Use Glucose Meter to monitor blood glucose readings four(4) times daily as directed by provider.   Accu-Chek Guide test strip Generic drug: glucose blood 4 (four) times daily.   Accu-Chek Guide Test test strip Generic drug: glucose blood Use 1 test strip four(4) times daily to monitor blood glucose as directed by provider.   albuterol  (2.5 MG/3ML) 0.083% nebulizer solution Commonly known as: PROVENTIL  Take 2.5 mg by nebulization every 6 (six) hours as needed for wheezing or shortness of breath.  albuterol  108 (90 Base) MCG/ACT inhaler Commonly known as: VENTOLIN  HFA Inhale 1-2 puffs into the lungs every 6 (six) hours as needed for wheezing or shortness of breath.   Alcohol Pads 70 % Pads SMARTSIG:Pledget(s) Topical 4 Times Daily   apixaban  5 MG Tabs tablet Commonly known as: ELIQUIS  Take 1 tablet (5 mg total) by mouth 2 (two) times daily.   atorvastatin  80 MG  tablet Commonly known as: LIPITOR  Take 1 tablet (80 mg total) by mouth daily.   Cetirizine  HCl 10 MG Caps Take 10 mg by mouth daily.   cimetidine  200 MG tablet Commonly known as: TAGAMET  Take 0.5 tablets (100 mg total) by mouth daily as needed.   CINNAMON PO Take 1-2 capsules by mouth 3 (three) times daily.   clobetasol  ointment 0.05 % Commonly known as: TEMOVATE  Apply 1 application  topically as needed (imflammation).   DULoxetine  60 MG capsule Commonly known as: CYMBALTA  Take 60 mg by mouth 2 (two) times daily.   Easy Comfort Pen Needles 31G X 5 MM Misc Generic drug: Insulin  Pen Needle INJECT IN SULIN EVERY DAY AS DIRECTED   Easy Mini Eject Lancing Device Misc 4 (four) times daily.   fluticasone  50 MCG/ACT nasal spray Commonly known as: FLONASE  SMARTSIG:2 Spray(s) Both Nares Daily PRN   FreeStyle Libre 3 Plus Sensor Misc USE TO MONITOR GLUCOSE CONTINUOUSLY AS DIRECTED. CHANGE SENSOR EVERY 15 DAYS.   FreeStyle Libre 3 Reader Espiridion USE TO MONITOR GLUCOSE CONTINUOUSLY AS DIRECTED   guaiFENesin  100 MG/5ML Soln Commonly known as: ROBITUSSIN Take 5 mLs (100 mg total) by mouth every 4 (four) hours as needed for cough or to loosen phlegm. What changed: when to take this   hydrOXYzine  25 MG tablet Commonly known as: ATARAX  Take 1 tablet (25 mg total) by mouth every 8 (eight) hours as needed for itching.   InterDry 10x144 Shee Change pad daily or if soiled   Klayesta  powder Generic drug: nystatin  APPLY TOPICALLY THREE TIMES DAILY   levothyroxine  100 MCG tablet Commonly known as: SYNTHROID  Take 1 tablet (100 mcg total) by mouth daily before breakfast.   lidocaine  5 % Commonly known as: Lidoderm  Place 1 patch onto the skin daily. Remove & Discard patch within 12 hours or as directed by MD What changed:  when to take this reasons to take this   lubiprostone  24 MCG capsule Commonly known as: AMITIZA  Take 1 capsule (24 mcg total) by mouth 2 (two) times daily  with a meal. What changed:  when to take this reasons to take this   methocarbamol  500 MG tablet Commonly known as: Robaxin  Take 1 tablet (500 mg total) by mouth 3 (three) times daily as needed. What changed: reasons to take this   pantoprazole  40 MG tablet Commonly known as: PROTONIX  Take 1 tablet (40 mg total) by mouth daily before breakfast.   pregabalin  75 MG capsule Commonly known as: Lyrica  Take 1 capsule (75 mg total) by mouth daily. Start taking on: December 07, 2023 What changed: when to take this   Repatha  SureClick 140 MG/ML Soaj Generic drug: Evolocumab  Inject 140 mg into the skin every 14 (fourteen) days.   Restasis 0.05 % ophthalmic emulsion Generic drug: cycloSPORINE Place 1 drop into both eyes 2 (two) times daily.   Simethicone 125 MG Caps Take 1 capsule every day by oral route as needed.   spironolactone 25 MG tablet Commonly known as: ALDACTONE Take 25 mg by mouth daily.   Stiolto Respimat  2.5-2.5 MCG/ACT Aers Generic drug: Tiotropium  Bromide-Olodaterol Inhale 2 puffs into the lungs daily.   tirzepatide  12.5 MG/0.5ML Pen Commonly known as: MOUNJARO  Inject 12.5 mg into the skin once a week.   Torsemide  60 MG Tabs Take 60 mg by mouth 2 (two) times daily. What changed:  medication strength how much to take when to take this   traMADol  50 MG tablet Commonly known as: ULTRAM  Take 1 tablet (50 mg total) by mouth every 8 (eight) hours as needed for severe pain (pain score 7-10). What changed:  when to take this reasons to take this   traZODone  150 MG tablet Commonly known as: DESYREL  Take 150 mg by mouth at bedtime.   Tresiba  FlexTouch 100 UNIT/ML FlexTouch Pen Generic drug: insulin  degludec Inject 15 Units into the skin at bedtime.   ursodiol 300 MG capsule Commonly known as: ACTIGALL Take 300 mg by mouth 2 (two) times daily.   vitamin C  1000 MG tablet Take 500 mg by mouth daily.   Vitamin D  50 MCG (2000 UT) tablet Take 4,000 Units  by mouth daily. What changed: how much to take        Contact information for follow-up providers     [facility] Follow up on 12/16/2023.   Specialty: Cardiology Why: Advanced [facility] Clinic 9:30 AM  Entrance C, Free Valet Parking Contact information: [address] [place]  [address] [phone]             Contact information for after-discharge care     Destination     [facility] .   Service: Skilled Nursing Contact information: [address] [place]   [address] [phone]                    Discharge Exam: Filed Weights   12/03/23 0432 12/04/23 0432 12/05/23 0418  Weight: 102.6 kg 100.6 kg 92.2 kg   BP (!) 112/54 (BP Location: Left Leg)   Pulse 94   Temp 97.9 F (36.6 C) (Oral)   Resp 20   Ht 5' 8 (1.727 m)   Wt 92.2 kg   SpO2 98%   BMI 30.90 kg/m   Patient is feeling better, has no chest pain and no dyspnea, no PND or orthopnea. Tolerating po well  Neurology awake and alert ENT with mild pallor Cardiovascular with S1 and S2 present and regular with no gallops or rubs, positive systolic murmur at the apex No JVD Respiratory with no rales or wheezing, poor inspiratory effort with no rhonchi, anterior auscultation  Abdomen protuberant, not distended and not tender Trace lower extremity edema   Condition at discharge: stable  The results of significant diagnostics from this hospitalization (including imaging, microbiology, ancillary and laboratory) are listed below for reference.   Imaging Studies: US  EKG Site Rite Result Date: 12/01/2023 If Site Rite image not attached, placement could not be confirmed due to current cardiac rhythm.  DG CHEST PORT 1 VIEW Result Date: 12/01/2023 CLINICAL DATA:  PICC line placement. EXAM: PORTABLE CHEST 1 VIEW COMPARISON:  11/24/2023 FINDINGS: The cardio pericardial  silhouette is enlarged. There is pulmonary vascular congestion without overt pulmonary edema. Right PICC line tip overlies the right lung apex compatible with placement in the subclavian vein. Telemetry leads overlie the chest. IMPRESSION: Right PICC line tip overlies the right lung apex compatible with placement in the subclavian vein. If this is the same PICC line that was present on the study from 1 week  ago, it has been pulled back in the interval. These results will be called to the ordering clinician or representative by the Radiologist Assistant, and communication documented in the PACS or [employer]. Electronically Signed   By: Camellia Candle M.D.   On: 12/01/2023 07:13   US  EKG SITE RITE Result Date: 11/29/2023 If Site Rite image not attached, placement could not be confirmed due to current cardiac rhythm.  CARDIAC CATHETERIZATION Result Date: 11/26/2023 Findings: RA =  27 RV = 52/26 PA =  48/26 (36) PCW = 22 Fick cardiac output/index = 6.3/2.8 Thermo CO/CI = 4.0/1.8 PVR = 2.2 (Fick) 3.5 (TD) Ao sat = 97% PA sat = 62%, 64% PAPi = 0.81 Assessment: 1. 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 Can consider inpatient trial of milrinone and IV diuresis to optimize but would strongly consider Palliative Care involvement. Toribio Fuel, MD 8:53 AM  DG Abd 1 View Result Date: 11/25/2023 CLINICAL DATA:  Abdominal pain. EXAM: ABDOMEN - 1 VIEW COMPARISON:  Chest, abdomen and pelvis CT 09/21/2022. Right upper quadrant abdominal ultrasound dated 09/14/2023. FINDINGS: Normal bowel-gas pattern. Lumbar and lower thoracic spine degenerative changes. No visible urinary tract calculi. IMPRESSION: No acute abnormality. Electronically Signed   By: Elspeth Bathe M.D.   On: 11/25/2023 11:31   DG CHEST PORT 1 VIEW Result Date: 11/24/2023 EXAM: 1 VIEW XRAY OF THE CHEST 11/24/2023 09:32:00  AM COMPARISON: 11/16/2023 CLINICAL HISTORY: Heart failure, HTN, diabetes. FINDINGS: LUNGS AND PLEURA: Mild bilateral interstitial opacities suggestive of interstitial edema, with indistinct pulmonary vasculature. HEART AND MEDIASTINUM: Stable cardiomegaly, mild enlargement of the cardiopericardial silhouette. BONES AND SOFT TISSUES: Degenerative glenohumeral arthropathy on the left, thoracic spondylosis, status post right shoulder arthroplasty. Right PICC in place with tip terminating over the SVC. IMPRESSION: 1. Pulmonary interstitial edema. 2. Stable cardiomegaly. 3. Right PICC with tip over the SVC. 4. Degenerative left glenohumeral arthropathy. 5. Thoracic spondylosis. 6. Status post right shoulder arthroplasty. Electronically signed by: Ryan Salvage MD 11/24/2023 02:14 PM EDT RP Workstation: [id number]   US  EKG SITE RITE Result Date: 11/18/2023 If Site Rite image not attached, placement could not be confirmed due to current cardiac rhythm.  ECHOCARDIOGRAM COMPLETE Result Date: 11/17/2023    ECHOCARDIOGRAM REPORT   Patient Name:   Stephanie [name] Culp Date of Exam: 11/17/2023 Medical Rec #:  [id number]   Height:       68.0 in Accession #:    [id number]  Weight:       282.8 lb Date of Birth:  1950/06/03   BSA:          2.368 m Patient Age:    73 years    BP:           110/68 mmHg Patient Gender: F           HR:           73 bpm. Exam [facility]:  [facility] Procedure: 2D Echo, 3D Echo, Cardiac Doppler and Color Doppler (Both Spectral            and Color Flow Doppler were utilized during procedure). Indications:    I50.40* Unspecified combined systolic (congestive) and diastolic                 (congestive) heart failure  History:        Patient has prior history of Echocardiogram examinations, most  recent 05/24/2023. CHF, Abnormal ECG, COPD, TIA and Stroke,                 Arrythmias:Atrial Fibrillation, Signs/Symptoms:Dyspnea,                 Shortness of Breath, Edema and Chest Pain; Risk                  Factors:Hypertension, Diabetes and Former Smoker.  Sonographer:    Ellouise Mose RDCS Referring Phys: [phone] [facility]  Sonographer Comments: Technically difficult study due to poor echo windows and patient is obese. Image acquisition challenging due to patient body habitus. IMPRESSIONS  1. Left ventricular ejection fraction, by estimation, is 35 to 40%. Left ventricular ejection fraction by 3D volume is 33 %. The left ventricle has moderately decreased function. The left ventricle demonstrates regional wall motion abnormalities (see scoring diagram/findings for description). The left ventricular internal cavity size was mildly dilated. Left ventricular diastolic parameters are indeterminate.  2. Right ventricular systolic function is mildly reduced. The right ventricular size is severely enlarged. Moderately increased right ventricular wall thickness. There is normal pulmonary artery systolic pressure. The estimated right ventricular systolic pressure is 31.8 mmHg.  3. Large pleural effusion.  4. The mitral valve is normal in structure. Mild mitral valve regurgitation. No evidence of mitral stenosis.  5. The aortic valve is tricuspid. Aortic valve regurgitation is not visualized. No aortic stenosis is present.  6. The inferior vena cava is dilated in size with >50% respiratory variability, suggesting right atrial pressure of 8 mmHg. FINDINGS  Left Ventricle: Left ventricular ejection fraction, by estimation, is 35 to 40%. Left ventricular ejection fraction by 3D volume is 33 %. The left ventricle has moderately decreased function. The left ventricle demonstrates regional wall motion abnormalities. The left ventricular internal cavity size was mildly dilated. There is no left ventricular hypertrophy. Left ventricular diastolic parameters are indeterminate.  LV Wall Scoring: The anterior septum, mid inferoseptal segment, and basal inferoseptal segment are akinetic. The entire inferior wall and posterior wall are  hypokinetic. The entire anterior wall, antero-lateral wall, apical lateral segment, apical septal segment, and apex are normal. Right Ventricle: The right ventricular size is severely enlarged. Moderately increased right ventricular wall thickness. Right ventricular systolic function is mildly reduced. There is normal pulmonary artery systolic pressure. The tricuspid regurgitant velocity is 2.44 m/s, and with an assumed right atrial pressure of 8 mmHg, the estimated right ventricular systolic pressure is 31.8 mmHg. Left Atrium: Left atrial size was normal in size. Right Atrium: Right atrial size was normal in size. Pericardium: Trivial pericardial effusion is present. Mitral Valve: The mitral valve is normal in structure. Mild mitral valve regurgitation. No evidence of mitral valve stenosis. Tricuspid Valve: The tricuspid valve is normal in structure. Tricuspid valve regurgitation is mild . No evidence of tricuspid stenosis. Aortic Valve: The aortic valve is tricuspid. Aortic valve regurgitation is not visualized. No aortic stenosis is present. Pulmonic Valve: The pulmonic valve was not well visualized. Pulmonic valve regurgitation is not visualized. No evidence of pulmonic stenosis. Aorta: The aortic root and ascending aorta are structurally normal, with no evidence of dilitation. Venous: The inferior vena cava is dilated in size with greater than 50% respiratory variability, suggesting right atrial pressure of 8 mmHg. IAS/Shunts: No atrial level shunt detected by color flow Doppler. Additional Comments: 3D was performed not requiring image post processing on an independent workstation and was abnormal. There is a large pleural effusion.  LEFT VENTRICLE PLAX  2D LVIDd:         4.70 cm         Diastology LVIDs:         3.80 cm         LV e' medial:    4.79 cm/s LV PW:         1.00 cm         LV E/e' medial:  18.3 LV IVS:        0.90 cm         LV e' lateral:   7.29 cm/s LVOT diam:     1.80 cm         LV E/e' lateral:  12.0 LV SV:         48 LV SV Index:   20 LVOT Area:     2.54 cm        3D Volume EF                                LV 3D EF:    Left                                             ventricul LV Volumes (MOD)                            ar LV vol d, MOD    99.2 ml                    ejection A2C:                                        fraction LV vol d, MOD    105.0 ml                   by 3D A4C:                                        volume is LV vol s, MOD    61.6 ml                    33 %. A2C: LV vol s, MOD    57.0 ml A4C:                           3D Volume EF: LV SV MOD A2C:   37.6 ml       3D EF:        33 % LV SV MOD A4C:   105.0 ml      LV EDV:       118 ml LV SV MOD BP:    43.8 ml       LV ESV:       80 ml                                LV SV:        39 ml RIGHT VENTRICLE  IVC RV S prime:     9.14 cm/s  IVC diam: 2.30 cm TAPSE (M-mode): 1.9 cm LEFT ATRIUM             Index        RIGHT ATRIUM           Index LA diam:        3.70 cm 1.56 cm/m   RA Area:     18.60 cm LA Vol (A2C):   45.3 ml 19.13 ml/m  RA Volume:   64.30 ml  27.15 ml/m LA Vol (A4C):   24.9 ml 10.51 ml/m LA Biplane Vol: 34.1 ml 14.40 ml/m  AORTIC VALVE LVOT Vmax:   94.20 cm/s LVOT Vmean:  62.800 cm/s LVOT VTI:    0.189 m  AORTA Ao Root diam: 2.90 cm Ao Asc diam:  3.10 cm MITRAL VALVE               TRICUSPID VALVE MV Area (PHT): 4.49 cm    TR Peak grad:   23.8 mmHg MV Decel Time: 169 msec    TR Vmax:        244.00 cm/s MV E velocity: 87.50 cm/s MV A velocity: 97.20 cm/s  SHUNTS MV E/A ratio:  0.90        Systemic VTI:  0.19 m                            Systemic Diam: 1.80 cm Vishnu Priya Mallipeddi Electronically signed by Diannah Late Mallipeddi Signature Date/Time: 11/17/2023/11:16:08 AM    Final    DG Chest Port 1 View Result Date: 11/16/2023 CLINICAL DATA:  Dyspnea. EXAM: PORTABLE CHEST 1 VIEW COMPARISON:  April 29, 2023. FINDINGS: Mild cardiomegaly is noted with central pulmonary vascular congestion and possible minimal  bilateral pulmonary edema. Status post right shoulder arthroplasty. IMPRESSION: Mild cardiomegaly with central pulmonary vascular congestion and possible minimal bilateral pulmonary edema. Electronically Signed   By: Lynwood Landy Raddle M.D.   On: 11/16/2023 11:42    Microbiology: Results for orders placed or performed during the hospital encounter of 01/08/23  Resp panel by RT-PCR (RSV, Flu A&B, Covid) Anterior Nasal Swab     Status: Abnormal   Collection Time: 01/08/23 10:51 AM   Specimen: Anterior Nasal Swab  Result Value Ref Range Status   SARS Coronavirus 2 by RT PCR POSITIVE (A) NEGATIVE Final    Comment: (NOTE) SARS-CoV-2 target nucleic acids are DETECTED.  The SARS-CoV-2 RNA is generally detectable in upper respiratory specimens during the acute phase of infection. Positive results are indicative of the presence of the identified virus, but do not rule out bacterial infection or co-infection with other pathogens not detected by the test. Clinical correlation with patient history and other diagnostic information is necessary to determine patient infection status. The expected result is Negative.  Fact Sheet for Patients: BloggerCourse.com  Fact Sheet for Healthcare Providers: SeriousBroker.it  This test is not yet approved or cleared by the [place]  FDA and  has been authorized for detection and/or diagnosis of SARS-CoV-2 by FDA under an Emergency Use Authorization (EUA).  This EUA will remain in effect (meaning this test can be used) for the duration of  the COVID-19 declaration under Section 564(b)(1) of the A ct, 21 U.S.C. section 360bbb-3(b)(1), unless the authorization is terminated or revoked sooner.     Influenza A by PCR NEGATIVE NEGATIVE Final   Influenza B by PCR NEGATIVE NEGATIVE Final  Comment: (NOTE) The Xpert Xpress SARS-CoV-2/FLU/RSV plus assay is intended as an aid in the diagnosis of influenza from  Nasopharyngeal swab specimens and should not be used as a sole basis for treatment. Nasal washings and aspirates are unacceptable for Xpert Xpress SARS-CoV-2/FLU/RSV testing.  Fact Sheet for Patients: BloggerCourse.com  Fact Sheet for Healthcare Providers: SeriousBroker.it  This test is not yet approved or cleared by the [place]  FDA and has been authorized for detection and/or diagnosis of SARS-CoV-2 by FDA under an Emergency Use Authorization (EUA). This EUA will remain in effect (meaning this test can be used) for the duration of the COVID-19 declaration under Section 564(b)(1) of the Act, 21 U.S.C. section 360bbb-3(b)(1), unless the authorization is terminated or revoked.     Resp Syncytial Virus by PCR NEGATIVE NEGATIVE Final    Comment: (NOTE) Fact Sheet for Patients: BloggerCourse.com  Fact Sheet for Healthcare Providers: SeriousBroker.it  This test is not yet approved or cleared by the [place]  FDA and has been authorized for detection and/or diagnosis of SARS-CoV-2 by FDA under an Emergency Use Authorization (EUA). This EUA will remain in effect (meaning this test can be used) for the duration of the COVID-19 declaration under Section 564(b)(1) of the Act, 21 U.S.C. section 360bbb-3(b)(1), unless the authorization is terminated or revoked.  Performed at [facility], [address] [place], [place] [address]   Blood culture (routine x 2)     Status: None   Collection Time: 01/08/23  3:18 PM   Specimen: BLOOD  Result Value Ref Range Status   Specimen Description BLOOD BLOOD RIGHT ARM  Final   Special Requests NONE  Final   Culture   Final    NO GROWTH 6 DAYS Performed at [facility], [address], [place], [place] [address]    Report Status 01/14/2023 FINAL  Final  Blood culture (routine x 2)     Status: None   Collection Time: 01/08/23  3:18 PM    Specimen: BLOOD  Result Value Ref Range Status   Specimen Description BLOOD BLOOD RIGHT HAND  Final   Special Requests NONE  Final   Culture   Final    NO GROWTH 6 DAYS Performed at [facility], [address] [place], [place] [address]    Report Status 01/14/2023 FINAL  Final    Labs: CBC: Recent Labs  Lab 11/29/23 1201 11/30/23 0819 12/01/23 1230 12/04/23 0500 12/06/23 0010  WBC 9.0 8.1 8.7 10.1 10.2  NEUTROABS 6.7 5.7  --   --   --   HGB 9.1* 8.3* 8.5* 8.4* 9.1*  HCT 27.6* 26.2* 26.7* 26.1* 29.4*  MCV 83.1 84.2 84.8 84.7 88.3  PLT 118* 104* 117* 118* 124*   Basic Metabolic Panel: Recent Labs  Lab 11/30/23 0819 11/30/23 1500 12/01/23 1224 12/02/23 0415 12/03/23 0631 12/04/23 0500 12/05/23 0500  NA 132*   < > 131* 130* 131* 133* 134*  K 3.5   < > 4.3 3.8 3.2* 3.5 3.7  CL 97*   < > 99 98 97* 98 98  CO2 25   < > 22 24 24 25 26   GLUCOSE 200*   < > 224* 239* 162* 124* 145*  BUN 29*   < > 32* 35* 37* 38* 42*  CREATININE 1.78*   < > 1.98* 1.96* 2.07* 2.17* 2.11*  CALCIUM  8.1*   < > 8.6* 8.6* 8.8* 8.8* 9.3  MG 1.9  --  2.1 2.2 1.9 1.9  --   PHOS  --   --   --   --   --   --  3.0   < > = values in this interval not displayed.   Liver Function Tests: Recent Labs  Lab 11/29/23 1201 12/05/23 0500  AST 34  --   ALT 20  --   ALKPHOS 196*  --   BILITOT 2.3*  --   PROT 7.8  --   ALBUMIN 2.4* 2.3*   CBG: Recent Labs  Lab 12/04/23 1227 12/04/23 1603 12/04/23 2140 12/05/23 0749 12/05/23 1246  GLUCAP 164* 127* 168* 130* 159*    Discharge time spent: greater than 30 minutes.  Signed: Elidia Toribio Furnace, MD [facility] 12/06/2023

## 2023-12-06 NOTE — Progress Notes (Signed)
 Tully RN printed AVS for discharge. All belongings gathered and verified with pt.to take to facility.

## 2023-12-06 NOTE — TOC Transition Note (Addendum)
 Transition of Care [facility]) - Discharge Note   Patient Details  Name: Stephanie [name] MRN: [id number] Date of Birth: 07/24/1950  Transition of [facility]) CM/SW Contact:  Arlana JINNY Nicholaus ISRAEL Phone Number: [phone] 12/06/2023, 11:09 AM   Clinical Narrative:   HF CSW spoke with Stephanie [name] at [facility] who confirmed that they do have a bed available for the patient.   11:03 AM- CSW spoke with the patient over the phone to discuss dc and transportation plans.   11:05 AM- CSW called and spoke with the patients daughter, Stephanie [name] to share dc and transportation plans.   12:20 PM :PTAR called.   Palliative will follow patient to SNF, referral made to [facility] through [facility].   CSW notified bedside RN via secure chat.  Room A-9; number to call for report [phone]       Final next level of care:  (TBD) Barriers to Discharge: Continued Medical Work up   Patient Goals and CMS Choice Patient states their goals for this hospitalization and ongoing recovery are:: TBD CMS Medicare.gov Compare Post Acute Care list provided to:: Patient Represenative (must comment) Choice offered to / list presented to : Patient [facility] ownership interest in [facility].provided to::  (n/a)    Discharge Placement                       Discharge Plan and Services Additional resources added to the After Visit Summary for   In-house Referral: Clinical Social Work   Post Acute Care Choice: Skilled Nursing Facility                               Social Drivers of Health (SDOH) Interventions SDOH Screenings   Food Insecurity: No Food Insecurity (11/17/2023)  Housing: Low Risk  (11/17/2023)  Transportation Needs: No Transportation Needs (11/17/2023)  Utilities: Not At Risk (11/17/2023)  Depression (PHQ2-9): Low Risk  (09/14/2023)  Social Connections: Moderately Isolated (11/17/2023)  Tobacco Use: Medium Risk (11/16/2023)     Readmission Risk Interventions     No data  to display

## 2023-12-06 NOTE — TOC Progression Note (Signed)
 Transition of Care [facility]) - Progression Note    Patient Details  Name: Stephanie [name] MRN: [id number] Date of Birth: [birth year]  Transition [facility] Care [facility]) CM/SW Contact  Isaiah Public, LCSWA Phone Number: 12/06/2023, 10:27 AM  Clinical Narrative:     CSW received consult for palliative services to follow patient at SNF. CSW spoke with patient at bedside. Patient agreeable for CSW to make referral to [facility] for palliative services to follow patient at SNF. CSW made referral to [facility] with [facility]. CSW spoke with Marval with [facility] who confirmed patient can dc over today if medically stable. CSW informed MD.   Expected Discharge Plan: Skilled Nursing Facility Barriers to Discharge: Continued Medical Work up               Expected Discharge Plan and Services In-house Referral: Clinical Social Work   Post Acute Care Choice: Skilled Nursing Facility Living arrangements for the past 2 months: Single Family Home                                       Social Drivers of Health (SDOH) Interventions SDOH Screenings   Food Insecurity: No Food Insecurity (11/17/2023)  Housing: Low Risk  (11/17/2023)  Transportation Needs: No Transportation Needs (11/17/2023)  Utilities: Not At Risk (11/17/2023)  Depression (PHQ2-9): Low Risk  (09/14/2023)  Social Connections: Moderately Isolated (11/17/2023)  Tobacco Use: Medium Risk (11/16/2023)    Readmission Risk Interventions     No data to display

## 2023-12-06 NOTE — Assessment & Plan Note (Signed)
 Patient was evaluated by GI, due to small volume rectal bleeding.  Apixaban  was held and had close follow up on cell count.  It was recommended conservative management and daily pantoprazole .  Acute blood loss anemia ruled out.  Patient was resumed on apixaban  with good toleration Discharge hgb is 9.1

## 2023-12-06 NOTE — Progress Notes (Signed)
 Physical Therapy Treatment Patient Details Name: Stephanie [name] MRN: [id number] DOB: 02-Jul-1950 Today's Date: 12/06/2023   History of Present Illness 73 y.o. female who presented 9/30 with lower extremity swelling, SOB, and weight gain. Dx acute on chronic systolic heart failure. PMH includes: systolic heart failure, hypothyroidism, hyperlipidemia, COPD/asthma, history of DVT, fibromyalgia, GERD and type 2 diabetes mellitus with nephropathy.    PT Comments  PT asked to return to assist pt back to bed for RN prior to d/c. Pt demos good carryover of instructions for transfer technique from earlier in session and continues to be able to complete sit-stand transfers with minA and small lateral steps with minA and RW. Pt limited by fatigue but without overt LOB. Recommendations remain appropriate.     If plan is discharge home, recommend the following: A lot of help with walking and/or transfers;A lot of help with bathing/dressing/bathroom;Assistance with cooking/housework   Can travel by private vehicle     No  Equipment Recommendations  Other (comment) (TBD)    Recommendations for Other Services       Precautions / Restrictions Precautions Precautions: Fall Recall of Precautions/Restrictions: Intact Restrictions Weight Bearing Restrictions Per Provider Order: No     Mobility  Bed Mobility Overal bed mobility: Needs Assistance Bed Mobility: Sit to Supine     Supine to sit: Min assist, HOB elevated, Used rails Sit to supine: Min assist   General bed mobility comments: minA to elevate LE into bed    Transfers Overall transfer level: Needs assistance Equipment used: Rolling walker (2 wheels) Transfers: Sit to/from Stand, Bed to chair/wheelchair/BSC Sit to Stand: Min assist   Step pivot transfers: Min assist       General transfer comment: good carryover of cues from morning, slowed rise to standing    Ambulation/Gait         Gait velocity: decreased     General  Gait Details: limited to ~6 lateral steps to bed, limited by fatigue, heavy dependence on BUE support      Balance Overall balance assessment: Needs assistance Sitting-balance support: No upper extremity supported, Feet supported Sitting balance-Leahy Scale: Good   Postural control: Posterior lean Standing balance support: Bilateral upper extremity supported, During functional activity, Reliant on assistive device for balance Standing balance-Leahy Scale: Poor Standing balance comment: dependent on BUE support, no overt buckling or LOB                            Communication Communication Communication: No apparent difficulties  Cognition Arousal: Alert Behavior During Therapy: Flat affect, WFL for tasks assessed/performed   PT - Cognitive impairments: No apparent impairments                         Following commands: Intact      Cueing Cueing Techniques: Verbal cues  Exercises Other Exercises Other Exercises: sit-stand from recliner x3    General Comments General comments (skin integrity, edema, etc.): VSS in session      Pertinent Vitals/Pain Pain Assessment Pain Assessment: No/denies pain Pain Intervention(s): Monitored during session     PT Goals (current goals can now be found in the care plan section) Acute Rehab PT Goals Patient Stated Goal: Get well PT Goal Formulation: With patient Time For Goal Achievement: 12/20/23 Potential to Achieve Goals: Good Progress towards PT goals: Progressing toward goals    Frequency    Min 2X/week  AM-PAC PT 6 Clicks Mobility   Outcome Measure  Help needed turning from your back to your side while in a flat bed without using bedrails?: A Little Help needed moving from lying on your back to sitting on the side of a flat bed without using bedrails?: A Lot Help needed moving to and from a bed to a chair (including a wheelchair)?: A Lot Help needed standing up from a chair using your  arms (e.g., wheelchair or bedside chair)?: A Lot Help needed to walk in hospital room?: Total Help needed climbing 3-5 steps with a railing? : Total 6 Click Score: 11    End of Session Equipment Utilized During Treatment: Oxygen ;Gait belt Activity Tolerance: Patient tolerated treatment well;Patient limited by pain Patient left: with call bell/phone within reach;in bed;with bed alarm set Nurse Communication: Mobility status PT Visit Diagnosis: Unsteadiness on feet (R26.81);Other abnormalities of gait and mobility (R26.89);Muscle weakness (generalized) (M62.81);Difficulty in walking, not elsewhere classified (R26.2)     Time: 8773-8764 PT Time Calculation (min) (ACUTE ONLY): 9 min  Charges:    $Therapeutic Exercise: 8-22 mins $Therapeutic Activity: 8-22 mins PT General Charges $$ ACUTE PT VISIT: 1 Visit                     Izetta Call, PT, DPT   Acute Rehabilitation Department Office [phone] Secure Chat Communication Preferred   Izetta JULIANNA Call 12/06/2023, 12:56 PM

## 2023-12-06 NOTE — Plan of Care (Signed)

## 2023-12-06 NOTE — Progress Notes (Addendum)
 Advanced Heart Failure Rounding Note  Cardiologist: Jayson Sierras, MD   Chief Complaint: Acute on chronic biventricular heart failure   Patient Profile   73 y/o female w/ chronic systolic heart failure w/ prominent RV dysfunction, hypertension, CKD IIIb, T2DM, OSA and h/o LE DVT admitted w/ a/c CHF w/ marked volume overload. Further w/u w/ RHC demonstrated severe RV dominant/ low output heart failure, felt to be end-stage. This admission requiring milrinone to assist w/ palliative diuresis.   Subjective:    Of milrinone. On torsemide  60 bid. Weights inaccurate. Scr stable   Denies CP or SOB. Working with PT. Awaiting SNF palcement.     Objective:   Weight Range: 92.2 kg Body mass index is 30.9 kg/m.   Vital Signs:   Temp:  [97.7 F (36.5 C)-98.4 F (36.9 C)] 97.9 F (36.6 C) (10/20 0940) Pulse Rate:  [93-101] 94 (10/20 0940) Resp:  [16-20] 20 (10/20 0940) BP: (109-125)/(54-61) 112/54 (10/20 0940) SpO2:  [92 %-98 %] 98 % (10/20 0940) Last BM Date : 12/04/23  Weight change: Filed Weights   12/03/23 0432 12/04/23 0432 12/05/23 0418  Weight: 102.6 kg 100.6 kg 92.2 kg    Intake/Output:   Intake/Output Summary (Last 24 hours) at 12/06/2023 1047 Last data filed at 12/06/2023 0941 Gross per 24 hour  Intake 400 ml  Output 4350 ml  Net -3950 ml      Physical Exam    General:  Sitting in chair  No resp difficulty HEENT: normal Neck: supple. JV 8-9 Carotids 2+ bilat; no bruits. No lymphadenopathy or thryomegaly appreciated. Cor: RRR 2/6 TR Lungs: clear Abdomen: soft, nontender, nondistended. No hepatosplenomegaly. No bruits or masses. Good bowel sounds. Extremities: no cyanosis, clubbing, rash, 1+ edema + UNNA Neuro: alert & orientedx3, cranial nerves grossly intact. moves all 4 extremities w/o difficulty. Affect pleasant    Telemetry   Sinus [id number] Personally reviewed   Labs    CBC Recent Labs    12/04/23 0500 12/06/23 0010  WBC 10.1 10.2   HGB 8.4* 9.1*  HCT 26.1* 29.4*  MCV 84.7 88.3  PLT 118* 124*   Basic Metabolic Panel Recent Labs    89/81/74 0500 12/05/23 0500  NA 133* 134*  K 3.5 3.7  CL 98 98  CO2 25 26  GLUCOSE 124* 145*  BUN 38* 42*  CREATININE 2.17* 2.11*  CALCIUM  8.8* 9.3  MG 1.9  --   PHOS  --  3.0     BNP: BNP (last 3 results) Recent Labs    01/08/23 1118 02/11/23 1445 11/18/23 1745  BNP 1,482.0* 1,213* 2,302.1*    ProBNP (last 3 results) Recent Labs    11/16/23 1201  PROBNP 14,798.0*     Medications:     Scheduled Medications:  apixaban   5 mg Oral BID   arformoterol  15 mcg Nebulization BID   And   umeclidinium bromide  1 puff Inhalation Daily   vitamin C   500 mg Oral Daily   atorvastatin   80 mg Oral QHS   Chlorhexidine  Gluconate Cloth  6 each Topical Daily   cycloSPORINE  1 drop Both Eyes BID   DULoxetine   60 mg Oral Daily   levothyroxine   100 mcg Oral QAC breakfast   loratadine   10 mg Oral QHS   pantoprazole   40 mg Oral QHS   pregabalin   75 mg Oral Daily   sodium chloride  flush  10-40 mL Intracatheter Q12H   sodium chloride  flush  3 mL Intravenous Q12H  spironolactone  25 mg Oral Daily   torsemide   60 mg Oral BID   traZODone   150 mg Oral QHS   ursodiol  300 mg Oral BID    Infusions:    PRN Medications: acetaminophen , alum & mag hydroxide-simeth, hydrOXYzine , ipratropium-albuterol , lubiprostone , methocarbamol , ondansetron  **OR** ondansetron  (ZOFRAN ) IV, sodium chloride  flush, sodium chloride  flush, traMADol     Assessment/Plan   1. 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 Mild MR/TR - RHC RA 27, PA 48/26, PCW 22, PVR 2.2, TD CI 1.8, PAPi 0.81>>Likely end-stage physiology  - not candidate for transplant given age, obesity and other co morbidities - not candidate for LVAD given severity of RV failure - weight down over 60  pounds. Volume status looks good. Scr stable - Continue Torsemide  60 mg BID (80 daily prior to admit). - Continue spiro 25 mg daily - No beta blocker with low-output HF - GDMT limited by CKD - She is end-stage. Planning d/c to SNF with Palliative support but refuses Hospice currently.   2. AKI on CKD IIIb - Cr 2.11 stable  - suspect cardiorenal/low output     3. CAD  - coronary artery classifications by CT imaging - no plans currently for LHC given renal dysfunction and absence of ischemic CP  - No s/s angina - continue statin    4. HTN - BP controlled    5. 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 Anemia/ Thrombocytopenia  Lower GI bleeding - suspect anemia of chronic disease d/t renal/hepatic dysfunction  - hgb slowly drifted down, now stable in 8s - has been seen by GI, no plans for endoscopy - Eliquis  had been held, now adding back per primary as she is no longer having bleeding  7. Chronic liver disease - In setting of advanced biventricular heart failure  She is stable from HF perspective. Nothing further to add. AHF team will sign off. Please call with questions.   Length of Stay: 20  Toribio Fuel, MD  12/06/2023, 10:47 AM

## 2023-12-09 NOTE — Telephone Encounter (Signed)
 Patient no showed appt today. Mailed no show letter and policy print out to patient. Sent reminder to the inbox to reschedule patient.

## 2023-12-10 NOTE — Telephone Encounter (Signed)
 Patient has been recently discharged to a facility from [facility]. Last appointment was on 04/30/2023. Please advise on when to reschedule patient. No current appointments available until mid to late Feb 2026.

## 2023-12-15 ENCOUNTER — Telehealth (HOSPITAL_COMMUNITY): Payer: Self-pay

## 2023-12-15 NOTE — Telephone Encounter (Signed)
 Called to confirm/remind patient of their appointment at the [facility] on 12/16/23 9:30.   Appointment:   [x] Confirmed  [] Left mess   [] No answer/No voice mail  [] VM Full/unable to leave message  [] Phone not in service  Patient reminded to bring all medications and/or complete list.  Confirmed patient has transportation. Gave directions, instructed to utilize valet parking.

## 2023-12-15 NOTE — Progress Notes (Signed)
 ADVANCED HF CLINIC CONSULT NOTE  Referring Physician: Roni, [facility] Primary Care: [place], [facility] Primary Cardiologist: Jayson Sierras, MD AHF cardiologist: Dr. Zenaida  Chief Complaint: End-stage BiV HF HPI: Stephanie [name] is a 73 y.o. female w/ chronic systolic heart failure w/ prominent RV dysfunction, hypertension, CKD IIIb, T2DM, OSA and h/o LE DVT.  Admitted 10/25 w/ a/c CHF w/ marked volume overload. Further w/u w/ RHC demonstrated severe RV dominant/ low output heart failure, felt to be end-stage. AHF team consulted to assist with palliative diuresis and inotrope support. Diuresed ~60lbs during admission, milrinone weaned off. Discharged to SNF with palliative care services to follow.   Today she returns for post hospital follow up, her daughter Luke was on the phone during the visit. Overall feeling ok. Denies palpitations, CP, dizziness, edema, or PND/Orthopnea. SOB with activity, works with PT. Usually wears CPAP at night but has not been wearing it at the facility. Wears O2 at home. Appetite ok, food is ok at facility. Has been weighing weekly at the facility. Taking all medications. Currently at [facility] center, plan for home possibly monday. Followed by palliative care.   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 102 each 5   ACCU-CHEK GUIDE test strip 4 (four) times daily.     albuterol  (PROVENTIL ) (2.5 MG/3ML) 0.083% nebulizer solution Take 2.5 mg by nebulization every 6 (six) hours as needed for wheezing or shortness of breath.     albuterol  (VENTOLIN  HFA) 108 (90 Base) MCG/ACT inhaler Inhale 1-2 puffs into the lungs every 6 (six) hours as needed for wheezing or shortness of breath. 18 g 0   Alcohol Swabs (ALCOHOL PADS) 70 % PADS SMARTSIG:Pledget(s) Topical 4 Times Daily     apixaban  (ELIQUIS ) 5 MG TABS tablet Take 1 tablet (5 mg total) by mouth 2 (two) times daily.     Ascorbic Acid  (VITAMIN C ) 1000 MG tablet Take 500 mg by mouth daily.     atorvastatin  (LIPITOR ) 80 MG tablet Take 1 tablet (80 mg total) by mouth daily. 30 tablet 0   Blood Glucose Calibration (ACCU-CHEK GUIDE CONTROL) LIQD See admin instructions.     Blood Glucose Monitoring Suppl (ACCU-CHEK GUIDE ME) w/Device KIT Use Glucose Meter to monitor blood glucose readings four(4) times daily as directed by provider. 1 kit 0   Cetirizine  HCl 10 MG CAPS Take 10 mg by mouth daily.     Cholecalciferol  (VITAMIN D ) 2000 units tablet Take 4,000 Units by mouth daily.  (Patient taking differently: Take 2,000 Units by mouth daily.)     cimetidine  (TAGAMET ) 200 MG tablet Take 0.5 tablets (100 mg total) by mouth daily as needed.     CINNAMON PO Take 1-2  capsules by mouth 3 (three) times daily.     clobetasol  ointment (TEMOVATE ) 0.05 % Apply 1 application  topically as needed (imflammation).     Continuous Glucose Receiver (FREESTYLE LIBRE 3 READER) DEVI USE TO MONITOR GLUCOSE CONTINUOUSLY AS DIRECTED 1 each 0   Continuous Glucose Sensor (FREESTYLE LIBRE 3 PLUS SENSOR) MISC USE TO MONITOR GLUCOSE CONTINUOUSLY AS DIRECTED. CHANGE SENSOR EVERY 15 DAYS. 6 each 1   DULoxetine  (CYMBALTA ) 60 MG capsule Take 60 mg by mouth 2 (two) times daily.     EASY COMFORT PEN NEEDLES 31G X 5 MM MISC INJECT IN SULIN EVERY DAY AS DIRECTED      Evolocumab  (REPATHA  SURECLICK) 140 MG/ML SOAJ Inject 140 mg into the skin every 14 (fourteen) days. 2 mL 3   fluticasone  (FLONASE ) 50 MCG/ACT nasal spray SMARTSIG:2 Spray(s) Both Nares Daily PRN     glucose blood (ACCU-CHEK GUIDE TEST) test strip Use 1 test strip four(4) times daily to monitor blood glucose as directed by provider. 200 each 5   guaiFENesin  (ROBITUSSIN) 100 MG/5ML SOLN Take 5 mLs (100 mg total) by mouth every 4 (four) hours as needed for cough or to loosen phlegm. (Patient taking differently: Take 5 mLs by mouth as needed for cough or to loosen phlegm.) 236 mL 0   hydrOXYzine  (ATARAX ) 25 MG tablet Take 1 tablet (25 mg total) by mouth every 8 (eight) hours as needed for itching. 30 tablet 0   insulin  degludec (TRESIBA  FLEXTOUCH) 100 UNIT/ML FlexTouch Pen Inject 15 Units into the skin at bedtime. 15 mL 3   KLAYESTA  powder APPLY TOPICALLY THREE TIMES DAILY 15 g 0   Lancet Devices (EASY MINI EJECT LANCING DEVICE) MISC 4 (four) times daily.     levothyroxine  (SYNTHROID ) 100 MCG tablet Take 1 tablet (100 mcg total) by mouth daily before breakfast. 90 tablet 1   lidocaine  (LIDODERM ) 5 % Place 1 patch onto the skin daily. Remove & Discard patch within 12 hours or as directed by MD 30 patch 0   lubiprostone  (AMITIZA ) 24 MCG capsule Take 1 capsule (24 mcg total) by mouth 2 (two) times daily with a meal. 60 capsule 11   methocarbamol  (ROBAXIN ) 500 MG tablet Take 1 tablet (500 mg total) by mouth 3 (three) times daily as needed. 60 tablet 1   pantoprazole  (PROTONIX ) 40 MG tablet Take 1 tablet (40 mg total) by mouth daily before breakfast. 90 tablet 3   pregabalin  (LYRICA ) 75 MG capsule Take 1 capsule (75 mg total) by mouth daily. 30 capsule 0   RESTASIS 0.05 % ophthalmic emulsion Place 1 drop into both eyes 2 (two) times daily.     Simethicone 125 MG CAPS Take 1 capsule every day by oral route as needed.     Skin Protectants, Misc. (INTERDRY 10X144) SHEE Change pad daily or if soiled 1 each 0    spironolactone (ALDACTONE) 25 MG tablet Take 25 mg by mouth daily.     Tiotropium Bromide-Olodaterol (STIOLTO RESPIMAT ) 2.5-2.5 MCG/ACT AERS Inhale 2 puffs into the lungs daily. 1 each    tirzepatide  (MOUNJARO ) 12.5 MG/0.5ML Pen Inject 12.5 mg into the skin once a week. 6 mL 3   torsemide  60 MG TABS Take 60 mg by mouth 2 (two) times daily. 60 tablet 0   traMADol  (ULTRAM ) 50 MG tablet Take 1 tablet (50 mg total) by mouth every 8 (eight) hours as needed for severe pain (pain score 7-10). 10 tablet 0   traZODone  (DESYREL ) 150 MG tablet Take 150 mg  by mouth at bedtime.     ursodiol (ACTIGALL) 300 MG capsule Take 300 mg by mouth 2 (two) times daily. (Patient taking differently: Take 600 mg by mouth 2 (two) times daily.)     No current facility-administered medications for this encounter.   Allergies  Allergen Reactions   Tetracyclines & Related Anaphylaxis and Rash   Banana Hives and Nausea And Vomiting   Jardiance  [Empagliflozin ]     Yeast infections, fatigue   Penicillins Rash and Other (See Comments)   Social History   Socioeconomic History   Marital status: Divorced    Spouse name: Not on file   Number of children: Not on file   Years of education: Not on file   Highest education level: Not on file  Occupational History   Not on file  Tobacco Use   Smoking status: Former    Current packs/day: 0.00    Average packs/day: 0.3 packs/day for 30.0 years (7.5 ttl pk-yrs)    Types: Cigarettes    Start date: 02/16/1981    Quit date: 02/17/2011    Years since quitting: 12.8   Smokeless tobacco: Never  Vaping Use   Vaping status: Never Used  Substance and Sexual Activity   Alcohol use: Not Currently    Comment: rare   Drug use: No   Sexual activity: Not Currently    Birth control/protection: Surgical  Other Topics Concern   Not on file  Social History Narrative   Pt lives with daughter    Retired    Social Drivers of Corporate Investment Banker Strain: Not on file  Food  Insecurity: No Food Insecurity (11/17/2023)   Hunger Vital Sign    Worried About Running Out of Food in the Last Year: Never true    Ran Out of Food in the Last Year: Never true  Transportation Needs: No Transportation Needs (11/17/2023)   PRAPARE - Administrator, Civil Service (Medical): No    Lack of Transportation (Non-Medical): No  Physical Activity: Not on file  Stress: Not on file  Social Connections: Moderately Isolated (11/17/2023)   Social Connection and Isolation Panel    Frequency of Communication with Friends and Family: More than three times a week    Frequency of Social Gatherings with Friends and Family: More than three times a week    Attends Religious Services: 1 to 4 times per year    Active Member of [employer] or Organizations: No    Attends Banker Meetings: Never    Marital Status: Divorced  Catering Manager Violence: Not At Risk (11/17/2023)   Humiliation, Afraid, Rape, and Kick questionnaire    Fear of Current or Ex-Partner: No    Emotionally Abused: No    Physically Abused: No    Sexually Abused: No    Family History  Problem Relation Age of Onset   Hypertension Mother    Diabetes Mother    COPD Mother    Arthritis Mother    Diabetes Father    Arthritis Father    Dementia Father    CAD Father    Hypothyroidism Sister    Diabetes Brother    Stroke Paternal Aunt    Colon cancer Niece    Colon polyps Neg Hx    Sleep apnea Neg Hx     Vitals:   12/16/23 1009  BP: 130/62  Pulse: (!) 102  SpO2: 98%  Weight: 83.2 kg (183 lb 6.4 oz)  Height: 5' 8 (1.727 m)  PHYSICAL EXAM: General:  elderly appearing.  No respiratory difficulty. Arrived in [facility] Neck: JVD flat.  Cor: Regular rate & rhythm. +MR  murmur. Lungs: clear, diminished bases Extremities: trace BLE edema  Neuro: alert & oriented x 3. Affect pleasant.   Wt Readings from Last 3 Encounters:  12/16/23 83.2 kg (183 lb 6.4 oz)  12/05/23 92.2 kg (203 lb 3.2 oz)  10/21/23  114.6 kg (252 lb 9.6 oz)    ECG: none today  ASSESSMENT & PLAN: 1. 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 Mild MR/TR - RHC RA 27, PA 48/26, PCW 22, PVR 2.2, TD CI 1.8, PAPi 0.81>>Likely end-stage physiology  - not candidate for transplant given age, obesity and other co morbidities - not candidate for LVAD given severity of RV failure - NYHA III, confounded by deconditioning - Volume appears stable on exam. Weight down since discharge.  - Continue Torsemide  60 mg BID (80 daily prior to admit). Labs today.  - Continue spiro 25 mg daily - No beta blocker with low-output HF - GDMT limited by CKD - She is end-stage. Discharged to SNF with Palliative support but refuses Hospice currently.    2. CKD IIIb - Cr 2.11 12/05/23 - Baseline SCr ~ 1.9 - labs today   3. CAD  - coronary artery classifications by CT imaging - no plans currently for LHC given renal dysfunction and absence of ischemic CP  - No s/s angina - continue statin    4. HTN - BP slightly elevated but stable.     5. H/o recurrent LE DVT - on Eliquis . Denies abnormal bleeding.    6. Anemia/ Thrombocytopenia  Lower GI bleeding - suspect anemia of chronic disease d/t renal/hepatic dysfunction  - hgb slowly drifted down, now stable in 8s-9s - has been seen by GI, no plans for endoscopy - Continue Eliquis    7. Chronic liver disease - In setting of advanced biventricular heart failure  Follow upin 2 months with APP  Beckey LITTIE Coe AGACNP-BC  12/16/23  Advanced [facility] Failure Clinic [facility] [address] Heart and Vascular [place] [address] [phone] (office)

## 2023-12-16 ENCOUNTER — Ambulatory Visit (HOSPITAL_COMMUNITY): Payer: Self-pay | Admitting: Internal Medicine

## 2023-12-16 ENCOUNTER — Encounter (HOSPITAL_COMMUNITY): Payer: Self-pay

## 2023-12-16 ENCOUNTER — Inpatient Hospital Stay (HOSPITAL_BASED_OUTPATIENT_CLINIC_OR_DEPARTMENT_OTHER)
Admit: 2023-12-16 | Discharge: 2023-12-16 | Disposition: A | Discharge status: Home / Self Care | Attending: Family Medicine | Admitting: Family Medicine

## 2023-12-16 ENCOUNTER — Telehealth (HOSPITAL_COMMUNITY): Payer: Self-pay | Admitting: *Deleted

## 2023-12-16 VITALS — BP 130/62 | HR 102 | Ht 68.0 in | Wt 183.4 lb

## 2023-12-16 DIAGNOSIS — Z86718 Personal history of other venous thrombosis and embolism: Secondary | ICD-10-CM

## 2023-12-16 DIAGNOSIS — D649 Anemia, unspecified: Secondary | ICD-10-CM

## 2023-12-16 DIAGNOSIS — I5020 Unspecified systolic (congestive) heart failure: Secondary | ICD-10-CM

## 2023-12-16 DIAGNOSIS — I10 Essential (primary) hypertension: Secondary | ICD-10-CM

## 2023-12-16 DIAGNOSIS — I5084 End stage heart failure: Secondary | ICD-10-CM | POA: Diagnosis not present

## 2023-12-16 DIAGNOSIS — I2510 Atherosclerotic heart disease of native coronary artery without angina pectoris: Secondary | ICD-10-CM | POA: Diagnosis not present

## 2023-12-16 DIAGNOSIS — N1832 Chronic kidney disease, stage 3b: Secondary | ICD-10-CM

## 2023-12-16 DIAGNOSIS — K769 Liver disease, unspecified: Secondary | ICD-10-CM

## 2023-12-16 LAB — BASIC METABOLIC PANEL WITH GFR
Anion gap: 10 (ref 5–15)
BUN: 41 mg/dL — ABNORMAL HIGH (ref 8–23)
CO2: 27 mmol/L (ref 22–32)
Calcium: 7.5 mg/dL — ABNORMAL LOW (ref 8.9–10.3)
Chloride: 102 mmol/L (ref 98–111)
Creatinine, Ser: 1.79 mg/dL — ABNORMAL HIGH (ref 0.44–1.00)
GFR, Estimated: 30 mL/min — ABNORMAL LOW (ref >60)
Glucose, Bld: 171 mg/dL — ABNORMAL HIGH (ref 70–99)
Potassium: 3.4 mmol/L — ABNORMAL LOW (ref 3.5–5.1)
Sodium: 139 mmol/L (ref 135–145)

## 2023-12-16 LAB — BRAIN NATRIURETIC PEPTIDE: B Natriuretic Peptide: 1088.4 pg/mL — ABNORMAL HIGH (ref 0.0–100.0)

## 2023-12-16 MED ORDER — POTASSIUM CHLORIDE CRYS ER 10 MEQ PO TBCR: 10.0000 meq | EXTENDED_RELEASE_TABLET | Freq: Every day | ORAL | 3 refills | Status: DC

## 2023-12-16 NOTE — Telephone Encounter (Signed)
 Called then faxed following instructions per Beckey Coe, NP to [facility]:  Phone: [phone] Fax: [phone]  Please call facility and have them give her 20 mEq KDUR x1 for low K. Can start KDUR 10 mEq daily after that (please send 10mEq pills). Repeat BMET in 7-10 days.  Fax transmission confirmed.

## 2023-12-16 NOTE — Patient Instructions (Signed)
 There has been no changes to your medications.  Labs done today, your results will be available in MyChart, we will contact you for abnormal readings.  Your physician recommends that you schedule a follow-up appointment in: 2 months.  If you have any questions or concerns before your next appointment please send us  a message through [facility] or call our office at [phone].    TO LEAVE A MESSAGE FOR THE NURSE SELECT OPTION 2, PLEASE LEAVE A MESSAGE INCLUDING: YOUR NAME DATE OF BIRTH CALL BACK NUMBER REASON FOR CALL**this is important as we prioritize the call backs  YOU WILL RECEIVE A CALL BACK THE SAME DAY AS LONG AS YOU CALL BEFORE 4:00 PM  At the [facility] Clinic, you and your health needs are our priority. As part of our continuing mission to provide you with exceptional heart care, we have created designated Provider Care Teams. These Care Teams include your primary Cardiologist (physician) and Advanced Practice Providers (APPs- Physician Assistants and Nurse Practitioners) who all work together to provide you with the care you need, when you need it.   You may see any of the following providers on your designated Care Team at your next follow up: Dr Toribio Fuel Dr Ezra Shuck Dr. Morene Brownie Greig Mosses, NP Caffie Shed, [place] [facility] [place], [place] Beckey Coe, NP Jordan Lee, NP Ellouise Class, NP Tinnie Redman, PharmD Jaun Bash, PharmD   Please be sure to bring in all your medications bottles to every appointment.    Thank you for choosing [facility]

## 2023-12-17 NOTE — Telephone Encounter (Signed)
 As a no-show letter was sent to patient indicating need to contact the office to reschedule appointment, no additional action is needed by clerical staff.

## 2023-12-21 ENCOUNTER — Emergency Department (HOSPITAL_COMMUNITY)

## 2023-12-21 ENCOUNTER — Encounter (HOSPITAL_COMMUNITY): Payer: Self-pay | Admitting: Emergency Medicine

## 2023-12-21 ENCOUNTER — Inpatient Hospital Stay (HOSPITAL_COMMUNITY)
Admission: EM | Admit: 2023-12-21 | Discharge: 2023-12-28 | DRG: 377 | Disposition: A | Discharge status: Skilled Nursing Facility | Source: Skilled Nursing Facility | Attending: Family Medicine | Admitting: Family Medicine

## 2023-12-21 ENCOUNTER — Other Ambulatory Visit: Payer: Self-pay

## 2023-12-21 DIAGNOSIS — R195 Other fecal abnormalities: Secondary | ICD-10-CM

## 2023-12-21 DIAGNOSIS — Z79890 Hormone replacement therapy (postmenopausal): Secondary | ICD-10-CM

## 2023-12-21 DIAGNOSIS — I130 Hypertensive heart and chronic kidney disease with heart failure and stage 1 through stage 4 chronic kidney disease, or unspecified chronic kidney disease: Secondary | ICD-10-CM | POA: Diagnosis present

## 2023-12-21 DIAGNOSIS — Z823 Family history of stroke: Secondary | ICD-10-CM

## 2023-12-21 DIAGNOSIS — K7402 Hepatic fibrosis, advanced fibrosis: Secondary | ICD-10-CM | POA: Diagnosis present

## 2023-12-21 DIAGNOSIS — K922 Gastrointestinal hemorrhage, unspecified: Principal | ICD-10-CM | POA: Diagnosis present

## 2023-12-21 DIAGNOSIS — I69351 Hemiplegia and hemiparesis following cerebral infarction affecting right dominant side: Secondary | ICD-10-CM

## 2023-12-21 DIAGNOSIS — I5042 Chronic combined systolic (congestive) and diastolic (congestive) heart failure: Secondary | ICD-10-CM

## 2023-12-21 DIAGNOSIS — Z833 Family history of diabetes mellitus: Secondary | ICD-10-CM

## 2023-12-21 DIAGNOSIS — N1832 Chronic kidney disease, stage 3b: Secondary | ICD-10-CM | POA: Diagnosis present

## 2023-12-21 DIAGNOSIS — I5084 End stage heart failure: Secondary | ICD-10-CM | POA: Diagnosis present

## 2023-12-21 DIAGNOSIS — I5043 Acute on chronic combined systolic (congestive) and diastolic (congestive) heart failure: Secondary | ICD-10-CM | POA: Diagnosis present

## 2023-12-21 DIAGNOSIS — D62 Acute posthemorrhagic anemia: Secondary | ICD-10-CM | POA: Diagnosis present

## 2023-12-21 DIAGNOSIS — E1122 Type 2 diabetes mellitus with diabetic chronic kidney disease: Secondary | ICD-10-CM | POA: Diagnosis present

## 2023-12-21 DIAGNOSIS — E039 Hypothyroidism, unspecified: Secondary | ICD-10-CM | POA: Diagnosis present

## 2023-12-21 DIAGNOSIS — K7682 Hepatic encephalopathy: Secondary | ICD-10-CM | POA: Diagnosis present

## 2023-12-21 DIAGNOSIS — I4819 Other persistent atrial fibrillation: Secondary | ICD-10-CM

## 2023-12-21 DIAGNOSIS — Z794 Long term (current) use of insulin: Secondary | ICD-10-CM

## 2023-12-21 DIAGNOSIS — M797 Fibromyalgia: Secondary | ICD-10-CM | POA: Diagnosis present

## 2023-12-21 DIAGNOSIS — Z880 Allergy status to penicillin: Secondary | ICD-10-CM

## 2023-12-21 DIAGNOSIS — R4182 Altered mental status, unspecified: Secondary | ICD-10-CM | POA: Diagnosis present

## 2023-12-21 DIAGNOSIS — D631 Anemia in chronic kidney disease: Secondary | ICD-10-CM | POA: Diagnosis present

## 2023-12-21 DIAGNOSIS — R1310 Dysphagia, unspecified: Secondary | ICD-10-CM | POA: Diagnosis present

## 2023-12-21 DIAGNOSIS — K921 Melena: Secondary | ICD-10-CM | POA: Diagnosis not present

## 2023-12-21 DIAGNOSIS — Z825 Family history of asthma and other chronic lower respiratory diseases: Secondary | ICD-10-CM

## 2023-12-21 DIAGNOSIS — Z800 Family history of malignant neoplasm of digestive organs: Secondary | ICD-10-CM

## 2023-12-21 DIAGNOSIS — Z96611 Presence of right artificial shoulder joint: Secondary | ICD-10-CM | POA: Diagnosis present

## 2023-12-21 DIAGNOSIS — K219 Gastro-esophageal reflux disease without esophagitis: Secondary | ICD-10-CM | POA: Diagnosis present

## 2023-12-21 DIAGNOSIS — Z6828 Body mass index (BMI) 28.0-28.9, adult: Secondary | ICD-10-CM

## 2023-12-21 DIAGNOSIS — Z7985 Long-term (current) use of injectable non-insulin antidiabetic drugs: Secondary | ICD-10-CM

## 2023-12-21 DIAGNOSIS — I2510 Atherosclerotic heart disease of native coronary artery without angina pectoris: Secondary | ICD-10-CM | POA: Diagnosis present

## 2023-12-21 DIAGNOSIS — Z820 Family history of epilepsy and other diseases of the nervous system: Secondary | ICD-10-CM

## 2023-12-21 DIAGNOSIS — I4891 Unspecified atrial fibrillation: Secondary | ICD-10-CM | POA: Diagnosis present

## 2023-12-21 DIAGNOSIS — I5082 Biventricular heart failure: Secondary | ICD-10-CM | POA: Diagnosis present

## 2023-12-21 DIAGNOSIS — Z79899 Other long term (current) drug therapy: Secondary | ICD-10-CM

## 2023-12-21 DIAGNOSIS — E785 Hyperlipidemia, unspecified: Secondary | ICD-10-CM | POA: Diagnosis present

## 2023-12-21 DIAGNOSIS — I10 Essential (primary) hypertension: Secondary | ICD-10-CM | POA: Diagnosis present

## 2023-12-21 DIAGNOSIS — R7989 Other specified abnormal findings of blood chemistry: Secondary | ICD-10-CM

## 2023-12-21 DIAGNOSIS — R188 Other ascites: Secondary | ICD-10-CM | POA: Diagnosis present

## 2023-12-21 DIAGNOSIS — E1165 Type 2 diabetes mellitus with hyperglycemia: Secondary | ICD-10-CM | POA: Diagnosis present

## 2023-12-21 DIAGNOSIS — F32A Depression, unspecified: Secondary | ICD-10-CM | POA: Diagnosis present

## 2023-12-21 DIAGNOSIS — E876 Hypokalemia: Secondary | ICD-10-CM | POA: Diagnosis present

## 2023-12-21 DIAGNOSIS — D649 Anemia, unspecified: Principal | ICD-10-CM | POA: Diagnosis present

## 2023-12-21 DIAGNOSIS — Z91018 Allergy to other foods: Secondary | ICD-10-CM

## 2023-12-21 DIAGNOSIS — Z7901 Long term (current) use of anticoagulants: Secondary | ICD-10-CM

## 2023-12-21 DIAGNOSIS — I132 Hypertensive heart and chronic kidney disease with heart failure and with stage 5 chronic kidney disease, or end stage renal disease: Secondary | ICD-10-CM | POA: Diagnosis present

## 2023-12-21 DIAGNOSIS — Z8249 Family history of ischemic heart disease and other diseases of the circulatory system: Secondary | ICD-10-CM

## 2023-12-21 DIAGNOSIS — Z87891 Personal history of nicotine dependence: Secondary | ICD-10-CM

## 2023-12-21 DIAGNOSIS — Z86718 Personal history of other venous thrombosis and embolism: Secondary | ICD-10-CM

## 2023-12-21 DIAGNOSIS — R410 Disorientation, unspecified: Secondary | ICD-10-CM

## 2023-12-21 DIAGNOSIS — Z8261 Family history of arthritis: Secondary | ICD-10-CM

## 2023-12-21 DIAGNOSIS — K7581 Nonalcoholic steatohepatitis (NASH): Secondary | ICD-10-CM | POA: Diagnosis present

## 2023-12-21 DIAGNOSIS — N179 Acute kidney failure, unspecified: Secondary | ICD-10-CM | POA: Diagnosis present

## 2023-12-21 DIAGNOSIS — J9611 Chronic respiratory failure with hypoxia: Secondary | ICD-10-CM | POA: Diagnosis present

## 2023-12-21 DIAGNOSIS — F410 Panic disorder [episodic paroxysmal anxiety] without agoraphobia: Secondary | ICD-10-CM | POA: Diagnosis present

## 2023-12-21 DIAGNOSIS — D6832 Hemorrhagic disorder due to extrinsic circulating anticoagulants: Secondary | ICD-10-CM | POA: Diagnosis present

## 2023-12-21 DIAGNOSIS — D472 Monoclonal gammopathy: Secondary | ICD-10-CM | POA: Diagnosis present

## 2023-12-21 DIAGNOSIS — I639 Cerebral infarction, unspecified: Secondary | ICD-10-CM | POA: Diagnosis present

## 2023-12-21 DIAGNOSIS — E7800 Pure hypercholesterolemia, unspecified: Secondary | ICD-10-CM | POA: Diagnosis present

## 2023-12-21 DIAGNOSIS — N184 Chronic kidney disease, stage 4 (severe): Secondary | ICD-10-CM

## 2023-12-21 DIAGNOSIS — I82403 Acute embolism and thrombosis of unspecified deep veins of lower extremity, bilateral: Secondary | ICD-10-CM | POA: Diagnosis present

## 2023-12-21 DIAGNOSIS — Z1152 Encounter for screening for COVID-19: Secondary | ICD-10-CM

## 2023-12-21 DIAGNOSIS — G459 Transient cerebral ischemic attack, unspecified: Secondary | ICD-10-CM

## 2023-12-21 DIAGNOSIS — E669 Obesity, unspecified: Secondary | ICD-10-CM | POA: Diagnosis present

## 2023-12-21 DIAGNOSIS — G4733 Obstructive sleep apnea (adult) (pediatric): Secondary | ICD-10-CM | POA: Diagnosis present

## 2023-12-21 DIAGNOSIS — Z881 Allergy status to other antibiotic agents status: Secondary | ICD-10-CM

## 2023-12-21 DIAGNOSIS — J449 Chronic obstructive pulmonary disease, unspecified: Secondary | ICD-10-CM | POA: Diagnosis present

## 2023-12-21 DIAGNOSIS — J4489 Other specified chronic obstructive pulmonary disease: Secondary | ICD-10-CM | POA: Diagnosis present

## 2023-12-21 DIAGNOSIS — R0789 Other chest pain: Secondary | ICD-10-CM

## 2023-12-21 LAB — PROTIME-INR
INR: 1.9 — ABNORMAL HIGH (ref 0.8–1.2)
Prothrombin Time: 22.6 s — ABNORMAL HIGH (ref 11.4–15.2)

## 2023-12-21 LAB — CBC WITH DIFFERENTIAL/PLATELET
Abs Immature Granulocytes: 0.04 K/uL (ref 0.00–0.07)
Basophils Absolute: 0 K/uL (ref 0.0–0.1)
Basophils Relative: 0 %
Eosinophils Absolute: 0.1 K/uL (ref 0.0–0.5)
Eosinophils Relative: 1 %
HCT: 22 % — ABNORMAL LOW (ref 36.0–46.0)
Hemoglobin: 6.4 g/dL — CL (ref 12.0–15.0)
Immature Granulocytes: 0 %
Lymphocytes Relative: 26 %
Lymphs Abs: 2.6 K/uL (ref 0.7–4.0)
MCH: 26.8 pg (ref 26.0–34.0)
MCHC: 29.1 g/dL — ABNORMAL LOW (ref 30.0–36.0)
MCV: 92.1 fL (ref 80.0–100.0)
Monocytes Absolute: 1.1 K/uL — ABNORMAL HIGH (ref 0.1–1.0)
Monocytes Relative: 11 %
Neutro Abs: 6.1 K/uL (ref 1.7–7.7)
Neutrophils Relative %: 62 %
Platelets: 177 K/uL (ref 150–400)
RBC: 2.39 MIL/uL — ABNORMAL LOW (ref 3.87–5.11)
RDW: 17.3 % — ABNORMAL HIGH (ref 11.5–15.5)
WBC: 9.9 K/uL (ref 4.0–10.5)
nRBC: 0 % (ref 0.0–0.2)

## 2023-12-21 LAB — IRON AND TIBC
Iron: 38 ug/dL (ref 28–170)
Saturation Ratios: 14 % (ref 10.4–31.8)
TIBC: 269 ug/dL (ref 250–450)
UIBC: 231 ug/dL

## 2023-12-21 LAB — COMPREHENSIVE METABOLIC PANEL WITH GFR
ALT: 16 U/L (ref 0–44)
AST: 45 U/L — ABNORMAL HIGH (ref 15–41)
Albumin: 3.1 g/dL — ABNORMAL LOW (ref 3.5–5.0)
Alkaline Phosphatase: 212 U/L — ABNORMAL HIGH (ref 38–126)
Anion gap: 11 (ref 5–15)
BUN: 52 mg/dL — ABNORMAL HIGH (ref 8–23)
CO2: 27 mmol/L (ref 22–32)
Calcium: 9.1 mg/dL (ref 8.9–10.3)
Chloride: 103 mmol/L (ref 98–111)
Creatinine, Ser: 1.97 mg/dL — ABNORMAL HIGH (ref 0.44–1.00)
GFR, Estimated: 26 mL/min — ABNORMAL LOW (ref >60)
Glucose, Bld: 110 mg/dL — ABNORMAL HIGH (ref 70–99)
Potassium: 4.4 mmol/L (ref 3.5–5.1)
Sodium: 140 mmol/L (ref 135–145)
Total Bilirubin: 1.3 mg/dL — ABNORMAL HIGH (ref 0.0–1.2)
Total Protein: 8.1 g/dL (ref 6.5–8.1)

## 2023-12-21 LAB — BLOOD GAS, VENOUS
Acid-Base Excess: 4.1 mmol/L — ABNORMAL HIGH (ref 0.0–2.0)
Bicarbonate: 28.5 mmol/L — ABNORMAL HIGH (ref 20.0–28.0)
Drawn by: 51519
O2 Saturation: 47.6 %
Patient temperature: 37.6
pCO2, Ven: 42 mmHg — ABNORMAL LOW (ref 44–60)
pH, Ven: 7.44 — ABNORMAL HIGH (ref 7.25–7.43)
pO2, Ven: 32 mmHg (ref 32–45)

## 2023-12-21 LAB — RETICULOCYTES
Immature Retic Fract: 31.3 % — ABNORMAL HIGH (ref 2.3–15.9)
RBC.: 2.41 MIL/uL — ABNORMAL LOW (ref 3.87–5.11)
Retic Count, Absolute: 156.2 K/uL (ref 19.0–186.0)
Retic Ct Pct: 6.5 % — ABNORMAL HIGH (ref 0.4–3.1)

## 2023-12-21 LAB — RESP PANEL BY RT-PCR (RSV, FLU A&B, COVID)  RVPGX2
Influenza A by PCR: NEGATIVE
Influenza B by PCR: NEGATIVE
Resp Syncytial Virus by PCR: NEGATIVE
SARS Coronavirus 2 by RT PCR: NEGATIVE

## 2023-12-21 LAB — URINALYSIS, W/ REFLEX TO CULTURE (INFECTION SUSPECTED)
Bacteria, UA: NONE SEEN
Bilirubin Urine: NEGATIVE
Glucose, UA: NEGATIVE mg/dL
Hgb urine dipstick: NEGATIVE
Ketones, ur: NEGATIVE mg/dL
Leukocytes,Ua: NEGATIVE
Nitrite: NEGATIVE
Protein, ur: NEGATIVE mg/dL
Specific Gravity, Urine: 1.012 (ref 1.005–1.030)
pH: 5 (ref 5.0–8.0)

## 2023-12-21 LAB — LIPID PANEL
Cholesterol: 122 mg/dL (ref 0–200)
HDL: 56 mg/dL (ref >40)
LDL Cholesterol: 51 mg/dL (ref 0–99)
Total CHOL/HDL Ratio: 2.2 ratio
Triglycerides: 75 mg/dL (ref <150)
VLDL: 15 mg/dL (ref 0–40)

## 2023-12-21 LAB — FERRITIN: Ferritin: 458 ng/mL — ABNORMAL HIGH (ref 11–307)

## 2023-12-21 LAB — PREPARE RBC (CROSSMATCH)

## 2023-12-21 LAB — AMMONIA: Ammonia: 105 umol/L — ABNORMAL HIGH (ref 9–35)

## 2023-12-21 LAB — VITAMIN B12: Vitamin B-12: 1086 pg/mL — ABNORMAL HIGH (ref 180–914)

## 2023-12-21 LAB — ABO/RH: ABO/RH(D): O POS

## 2023-12-21 LAB — GLUCOSE, CAPILLARY
Glucose-Capillary: 103 mg/dL — ABNORMAL HIGH (ref 70–99)
Glucose-Capillary: 118 mg/dL — ABNORMAL HIGH (ref 70–99)

## 2023-12-21 LAB — TROPONIN T, HIGH SENSITIVITY: Troponin T High Sensitivity: 232 ng/L (ref 0–19)

## 2023-12-21 LAB — POC OCCULT BLOOD, ED: Fecal Occult Bld: POSITIVE — AB

## 2023-12-21 LAB — PHOSPHORUS: Phosphorus: 4 mg/dL (ref 2.5–4.6)

## 2023-12-21 LAB — LACTIC ACID, PLASMA: Lactic Acid, Venous: 1.9 mmol/L (ref 0.5–1.9)

## 2023-12-21 LAB — FOLATE: Folate: 7 ng/mL (ref >5.9)

## 2023-12-21 LAB — MAGNESIUM: Magnesium: 2.1 mg/dL (ref 1.7–2.4)

## 2023-12-21 MED ORDER — FUROSEMIDE 10 MG/ML IJ SOLN
60.0000 mg | Freq: Once | INTRAMUSCULAR | Status: AC
  Administered 2023-12-21: 60 mg via INTRAVENOUS
  Filled 2023-12-21: qty 6

## 2023-12-21 MED ORDER — DULOXETINE HCL 60 MG PO CPEP
60.0000 mg | ORAL_CAPSULE | Freq: Two times a day (BID) | ORAL | Status: DC
  Administered 2023-12-21 – 2023-12-28 (×13): 60 mg via ORAL
  Filled 2023-12-21 (×14): qty 1

## 2023-12-21 MED ORDER — BISACODYL 5 MG PO TBEC: 5.0000 mg | DELAYED_RELEASE_TABLET | Freq: Every day | ORAL | Status: DC | PRN

## 2023-12-21 MED ORDER — HYDROXYZINE HCL 25 MG PO TABS: 25.0000 mg | ORAL_TABLET | Freq: Three times a day (TID) | ORAL | Status: DC | PRN

## 2023-12-21 MED ORDER — HEPARIN SODIUM (PORCINE) 5000 UNIT/ML IJ SOLN: 5000.0000 [IU] | Freq: Three times a day (TID) | INTRAMUSCULAR | Status: DC

## 2023-12-21 MED ORDER — TRAZODONE HCL 50 MG PO TABS
100.0000 mg | ORAL_TABLET | Freq: Every day | ORAL | Status: DC
  Administered 2023-12-21 – 2023-12-23 (×3): 100 mg via ORAL
  Filled 2023-12-21 (×3): qty 2

## 2023-12-21 MED ORDER — TRAZODONE HCL 50 MG PO TABS: 150.0000 mg | ORAL_TABLET | Freq: Every day | ORAL | Status: DC

## 2023-12-21 MED ORDER — SODIUM CHLORIDE 0.9% FLUSH
3.0000 mL | Freq: Two times a day (BID) | INTRAVENOUS | Status: DC
  Administered 2023-12-21 – 2023-12-28 (×13): 3 mL via INTRAVENOUS

## 2023-12-21 MED ORDER — CYCLOSPORINE 0.05 % OP EMUL
1.0000 [drp] | Freq: Two times a day (BID) | OPHTHALMIC | Status: DC
  Administered 2023-12-21 – 2023-12-28 (×14): 1 [drp] via OPHTHALMIC
  Filled 2023-12-21 (×13): qty 30

## 2023-12-21 MED ORDER — ACETAMINOPHEN 325 MG PO TABS: 650.0000 mg | ORAL_TABLET | Freq: Four times a day (QID) | ORAL | Status: DC | PRN

## 2023-12-21 MED ORDER — TRAZODONE HCL 50 MG PO TABS: 25.0000 mg | ORAL_TABLET | Freq: Every evening | ORAL | Status: DC | PRN

## 2023-12-21 MED ORDER — LEVOTHYROXINE SODIUM 100 MCG PO TABS
100.0000 ug | ORAL_TABLET | Freq: Every day | ORAL | Status: DC
  Administered 2023-12-22 – 2023-12-28 (×6): 100 ug via ORAL
  Filled 2023-12-21 (×8): qty 1

## 2023-12-21 MED ORDER — SODIUM CHLORIDE 0.9% FLUSH
3.0000 mL | Freq: Two times a day (BID) | INTRAVENOUS | Status: DC
  Administered 2023-12-21 – 2023-12-28 (×14): 3 mL via INTRAVENOUS

## 2023-12-21 MED ORDER — TORSEMIDE 20 MG PO TABS
60.0000 mg | ORAL_TABLET | Freq: Two times a day (BID) | ORAL | Status: DC
  Administered 2023-12-22 – 2023-12-23 (×3): 60 mg via ORAL
  Filled 2023-12-21 (×3): qty 3

## 2023-12-21 MED ORDER — ATORVASTATIN CALCIUM 10 MG PO TABS: 20.0000 mg | ORAL_TABLET | Freq: Every day | ORAL | Status: DC

## 2023-12-21 MED ORDER — SENNOSIDES-DOCUSATE SODIUM 8.6-50 MG PO TABS
1.0000 | ORAL_TABLET | Freq: Every day | ORAL | Status: DC
  Administered 2023-12-21 – 2023-12-27 (×7): 1 via ORAL
  Filled 2023-12-21 (×7): qty 1

## 2023-12-21 MED ORDER — INSULIN GLARGINE-YFGN 100 UNIT/ML ~~LOC~~ SOLN
10.0000 [IU] | Freq: Every day | SUBCUTANEOUS | Status: DC
  Administered 2023-12-21 – 2023-12-27 (×6): 10 [IU] via SUBCUTANEOUS
  Filled 2023-12-21 (×8): qty 0.1

## 2023-12-21 MED ORDER — ACETAMINOPHEN 650 MG RE SUPP: 650.0000 mg | Freq: Four times a day (QID) | RECTAL | Status: DC | PRN

## 2023-12-21 MED ORDER — ATORVASTATIN CALCIUM 40 MG PO TABS: 80.0000 mg | ORAL_TABLET | Freq: Every day | ORAL | Status: DC

## 2023-12-21 MED ORDER — INSULIN ASPART 100 UNIT/ML IJ SOLN: 0.0000 [IU] | Freq: Three times a day (TID) | INTRAMUSCULAR | Status: DC

## 2023-12-21 MED ORDER — IPRATROPIUM BROMIDE 0.02 % IN SOLN
0.5000 mg | Freq: Four times a day (QID) | RESPIRATORY_TRACT | Status: DC | PRN
  Administered 2023-12-25 – 2023-12-27 (×3): 0.5 mg via RESPIRATORY_TRACT
  Filled 2023-12-21 (×3): qty 2.5

## 2023-12-21 MED ORDER — OXYCODONE HCL 5 MG PO TABS
5.0000 mg | ORAL_TABLET | ORAL | Status: DC | PRN
  Administered 2023-12-23 – 2023-12-28 (×10): 5 mg via ORAL
  Filled 2023-12-21 (×11): qty 1

## 2023-12-21 MED ORDER — HYDRALAZINE HCL 20 MG/ML IJ SOLN: 10.0000 mg | INTRAMUSCULAR | Status: DC | PRN

## 2023-12-21 MED ORDER — FLEET ENEMA RE ENEM: 1.0000 | ENEMA | Freq: Once | RECTAL | Status: DC | PRN

## 2023-12-21 MED ORDER — ONDANSETRON HCL 4 MG/2ML IJ SOLN: 4.0000 mg | Freq: Four times a day (QID) | INTRAMUSCULAR | Status: DC | PRN

## 2023-12-21 MED ORDER — ATORVASTATIN CALCIUM 40 MG PO TABS: 40.0000 mg | ORAL_TABLET | Freq: Every day | ORAL | Status: DC

## 2023-12-21 MED ORDER — HYDROMORPHONE HCL 1 MG/ML IJ SOLN: 0.5000 mg | INTRAMUSCULAR | Status: DC | PRN

## 2023-12-21 MED ORDER — SENNOSIDES-DOCUSATE SODIUM 8.6-50 MG PO TABS: 1.0000 | ORAL_TABLET | Freq: Every evening | ORAL | Status: DC | PRN

## 2023-12-21 MED ORDER — ONDANSETRON HCL 4 MG PO TABS: 4.0000 mg | ORAL_TABLET | Freq: Four times a day (QID) | ORAL | Status: DC | PRN

## 2023-12-21 MED ORDER — PANTOPRAZOLE SODIUM 40 MG IV SOLR
40.0000 mg | Freq: Two times a day (BID) | INTRAVENOUS | Status: DC
  Administered 2023-12-21 – 2023-12-26 (×10): 40 mg via INTRAVENOUS
  Filled 2023-12-21 (×10): qty 10

## 2023-12-21 MED ORDER — SODIUM CHLORIDE 0.9% IV SOLUTION: Freq: Once | INTRAVENOUS | Status: AC

## 2023-12-21 MED ORDER — PREGABALIN 75 MG PO CAPS
75.0000 mg | ORAL_CAPSULE | Freq: Every day | ORAL | Status: DC
  Administered 2023-12-22 – 2023-12-24 (×3): 75 mg via ORAL
  Filled 2023-12-21 (×3): qty 1

## 2023-12-21 NOTE — Assessment & Plan Note (Signed)
 Chronic combined systolic and diastolic heart failure- HFrEF TEE 11/17/2023 showing reduced LVEF 35-40%, regional wall motion abnormalities, mildly reduced RV function -Patient has been evaluated by heart failure team on 12/06/2023, has been diuresed with oral torsemide  on outpatient setting -In ED patient has received 60 mg of IV Lasix , will resume torsemide  orally in a.m. -Poor candidate for SGL 2 inhibitor due to frequent yeast infection, not a candidate for Entresto  due to worsening creatinine -Medications reviewed continuing bisoprolol 

## 2023-12-21 NOTE — TOC Initial Note (Signed)
 Transition of Care [facility]) - Initial/Assessment Note    Patient Details  Name: Stephanie [name] MRN: [id number] Date of Birth: 07/04/1950  Transition of Care [facility]) CM/SW Contact:    Noreen KATHEE Cleotilde ISRAEL Phone Number: 12/21/2023, 3:41 PM  Clinical Narrative:                  CSW spoke with patient daughter Winnifred to get information on patient. Winnifred shared that patient has been at [facility] for about 15 days and that they had did an appeal and it was approved for more days. CSW attempted to call Debbie and no response, so sent her a message to check approved days from appeal. Patient lives with her daughter Winnifred and was independent at home. Patient has a walker, WC, and BSC at home. ICM will continue to   Addendum 3:46 pm  Marval  return CSW call and said that she sent updates in yesterday and notice came back today that patient won her appeal , NRD is 11/7. Auth will need to be restarted.   Expected Discharge Plan: Skilled Nursing Facility Barriers to Discharge: Continued Medical Work up   Patient Goals and CMS Choice Patient states their goals for this hospitalization and ongoing recovery are:: get rehab CMS Medicare.gov Compare Post Acute Care list provided to:: Patient Represenative (must comment) (Daughter - Winnifred)   [facility] ownership interest in [facility]provided to:: Adult Children    Expected Discharge Plan and Services In-house Referral: Clinical Social Work   Post Acute Care Choice: Skilled Nursing Facility, Durable Medical Equipment Living arrangements for the past 2 months: Single Family Home                                      Prior Living Arrangements/Services Living arrangements for the past 2 months: Single Family Home Lives with:: Adult Children Patient language and need for interpreter reviewed:: Yes Do you feel safe going back to the place where you live?: Yes      Need for Family Participation in Patient Care: Yes (Comment) Care giver  support system in place?: Yes (comment) Current home services: DME Criminal Activity/Legal Involvement Pertinent to Current Situation/Hospitalization: No - Comment as needed  Activities of Daily Living      Permission Sought/Granted      Share Information with NAME: Winnifred     Permission granted to share info w Relationship: Daughter     Emotional Assessment Appearance:: Appears stated age Attitude/Demeanor/Rapport: Unable to Assess Affect (typically observed): Unable to Assess Orientation: : Oriented to Self Alcohol / Substance Use: Not Applicable Psych Involvement: No (comment)  Admission diagnosis:  GIB (gastrointestinal bleeding) [K92.2] Patient Active Problem List   Diagnosis Date Noted   GIB (gastrointestinal bleeding) 12/21/2023   Altered mental status, unspecified 12/21/2023   Depression 12/04/2023   History of CVA (cerebrovascular accident) 12/01/2023   COVID-19 01/09/2023   Right-sided chest pain 01/08/2023   Elevated troponin 01/08/2023   Chronic respiratory failure with hypoxia (HCC) 01/08/2023   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   Anxiety 12/23/2021   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) 11/25/2021   Hyperlipidemia 11/25/2021   Anemia due to chronic kidney disease 11/25/2021   Bilateral primary osteoarthritis of knee 09/15/2021   Acute on chronic combined systolic and diastolic CHF (congestive heart failure) (HCC) 05/20/2021   At risk for obstructive sleep apnea 05/20/2021   Protein-calorie  malnutrition, moderate 05/17/2021   Cerebral infarction, unspecified (HCC) 05/17/2021   NASH (nonalcoholic steatohepatitis) 01/21/2021   Fibromyalgia 07/14/2020   [place] (dyspnea on exertion) 06/18/2020   Chronic pain syndrome 04/10/2020   Lab test positive for detection of COVID-19 virus 03/17/2020   Pneumonia due to COVID-19 virus 03/11/2020   Acute respiratory disease due to COVID-19 virus 03/11/2020   Long term (current)  use of anticoagulants 08/16/2018   TIA (transient ischemic attack) 09/15/2017   Localized osteoarthritis of right shoulder 08/08/2017   Acute embolism and thrombosis of unspecified deep veins of lower extremity, bilateral (HCC) 03/16/2017   Atrial fibrillation (HCC) 03/16/2017   Chronic kidney disease, stage 3b (HCC) 03/16/2017   Dysphagia 12/16/2015   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   Hyperglycemia due to type 2 diabetes mellitus (HCC) 12/09/2015   History of thromboembolism 11/08/2015   Constipation 10/27/2015   Elevated alkaline phosphatase level 07/23/2015   Alkaline phosphatase raised 07/23/2015   Hypoglycemia 07/01/2015   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   Maculopapular rash, generalized 06/27/2015   Alopecia 06/27/2015   Hypothyroidism 06/27/2015   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) 06/27/2015   Diverticulitis 10/19/2013   Former cigarette smoker 10/19/2013   Obesity 10/19/2013   Type 2 diabetes mellitus with hyperlipidemia (HCC) 10/19/2013   Hypertension    Anemia    GERD (gastroesophageal reflux disease)    COPD (chronic obstructive pulmonary disease) (HCC)    PCP:  Roni [facility] Pharmacy:   [facility] [place] - [place], [place] - [address] [place] [address] Phone: [phone] Fax: [phone]     Social Drivers of Health (SDOH) Social History: SDOH Screenings   Food Insecurity: No Food Insecurity (11/17/2023)  Housing: Low Risk  (11/17/2023)  Transportation Needs: No Transportation Needs (11/17/2023)  Utilities: Not At Risk (11/17/2023)  Depression (PHQ2-9): Low Risk  (09/14/2023)  Social Connections: Moderately Isolated (11/17/2023)  Tobacco Use: Medium Risk (12/21/2023)   SDOH Interventions:     Readmission Risk Interventions     No data to display

## 2023-12-21 NOTE — Assessment & Plan Note (Addendum)
 Continue statins, is on Repatha  - Holding statin, monitoring LFTs

## 2023-12-21 NOTE — Assessment & Plan Note (Signed)
-   Currently stable, continue to monitor, no signs of exacerbation -Continue inhalers, DuoNeb bronchodilators

## 2023-12-21 NOTE — Assessment & Plan Note (Signed)
 Altered mental status with confusion -Multifactorial likely secondary to polypharmacy, anemia, rule out infection, rule out acute CVA,  - Will continue to monitor closely, continue with neurochecks - Moderate on current home medications,

## 2023-12-21 NOTE — Assessment & Plan Note (Signed)
-   Chronically anticoagulated on Eliquis , on hold now due to GI bleed No signs of edema, erythema or pain in lower extremities

## 2023-12-21 NOTE — Assessment & Plan Note (Signed)
 Holding Aldactone, continue torsemide , -Trend BP closely

## 2023-12-21 NOTE — ED Notes (Signed)
 Patient transported to CT

## 2023-12-21 NOTE — Assessment & Plan Note (Signed)
 Last A1c 6.6 on 11/23/2023 -Holding home regimen, checking CBG q. ACHS, SSI coverage - Carb modified diet

## 2023-12-21 NOTE — Assessment & Plan Note (Signed)
 Chronic hepatic fibrosis With chronic mildly abnormal LFTs, monitor closely

## 2023-12-21 NOTE — Assessment & Plan Note (Signed)
-   Chronic kidney disease stage IIIb -Trend BUN/creatinine closely, avoiding nephrotoxins BUN/creatinine at baseline Lab Results  Component Value Date   CREATININE 1.97 (H) 12/21/2023   CREATININE 1.79 (H) 12/16/2023   CREATININE 2.11 (H) 12/05/2023

## 2023-12-21 NOTE — Plan of Care (Signed)

## 2023-12-21 NOTE — ED Notes (Signed)
 Called Pts daughter to obtain verbal consent to give pt blood. Lacinda Flor RN and Rock Law RN 2 nurse witness.

## 2023-12-21 NOTE — ED Notes (Signed)
 Date and time results received: 12/21/23 1319   Test: Hgb Critical Value: 6.4  Name of Provider Notified: Freddi, MD

## 2023-12-21 NOTE — Assessment & Plan Note (Signed)
 Continue supplemental oxygen , maintaining O2 sat greater than 92%

## 2023-12-21 NOTE — Assessment & Plan Note (Signed)
 Hold p.o. PPI, initiating IV Protonix  twice daily 40 mg

## 2023-12-21 NOTE — Assessment & Plan Note (Signed)
-   Reviewing current home medication, resuming accordingly, modifying due to altered mental status

## 2023-12-21 NOTE — Assessment & Plan Note (Deleted)
 Hepatic fibrosis With chronic mildly abnormal LFTs, monitor closely

## 2023-12-21 NOTE — Consult Note (Signed)
 Gastroenterology Consult   Referring Provider: No ref. provider found Primary Care Physician:  [place] Primary Gastroenterologist:  Ozell Hollingshead, MD [name]; [id number]; 1950/03/24   Admit date: 12/21/2023  LOS: 0 days   Date of Consultation: 12/21/2023  Reason for Consultation:  anemia, heme positive stool    History of Present Illness   Stephanie [name] is a 73 y.o. 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    ED course: T 99.6, P 106, BP 119/70, O2 100% on 2L [place] Lactic acid 1.9 K 4.4, BUN 52, Cre 1.97 Tbili 1.3, AP 212, AST 45, ALT 16, Alb 3.1 WBC 9.9, Hgb 6.4 (9.1 on 10/20), Hct 22, MCV 92.1, Plt 177 INR 1.9 Covid negative, RSV negative, Flu A/B negative  CT head without contrast with no acute findings. CXR with pulmonary edema and small bilateral pleural effusions. Dark brown stool on DRE, heme positive.  GI consult: Patient recently discharged to SNF after prolonged hospitalization last month (11/16/23-12/06/23). Discharged to SNF with Palliative support but patient declined Hospice.  Per D/C summary 12/06/23, patient developed lower GI bleeding, diverticular vs hemorrhoidal during last hospitalization, conservative management recommended. Evaluated by GI during admission. ECHO and Right heart cath last month.   Today, patient lethargic. She is oriented to person, place, time. She is slow to respond. She quickly falls asleep. She reports constipation issues. She has lubiprostone  on her medication list but last dose given 10/25.  She says it has been couple of days since her last BM. She is not aware of any black or bloody stools since she was discharged last month. She reports heartburn, dysphagia to solid foods. No vomiting. Denies abdominal pain.    Last dose of Eliquis  12/21/23 at 11:20.  Recent thrombocytopenia with normal platelets historically and normal at this time.  Followed by hepatology at [facility] for MASH and possible AMA negative PBC placed on ursodiol 600 mg bid. F2-F3 fibrosis with Fibroscan with liver stiffness of 11.7 kPa, FIB4 of 1.86, and NAFLD fibrosis score of 1.35. Missed recent follow up visit due to recent hospitalization.  MRI/MRCP with and without contrast 08/2022 with prior cholecystectomy but otherwise unremarkable.  Abd u/s 04/2023: increased echotexture of liver.  Abd u/s 08/2023: mildly lobulated contours with mildly echogenic liver, s/o cirrhosis  EGD 08/2019: -Patent tubular esophagus. S/p dilation.  -KOH brushing positive for yeast.  -Medium sized hiatal hernia  Colonoscopy 12/2015: -diverticulosis entire examined colon  Prior to Admission medications   Medication Sig Start Date End Date Taking? Authorizing Provider  ascorbic acid  (VITAMIN C ) 500 MG tablet Take 500 mg by mouth daily.   Yes [provider]  atorvastatin  (LIPITOR ) 80 MG tablet Take 1 tablet (80 mg total) by mouth daily. 05/22/21 06/24/58 Yes Arrien, Elidia Sieving, MD  Accu-Chek FastClix Lancets MISC Use 1 lancet to check blood glucose four (4) times daily as directed by provider. 09/23/23   Therisa Benton PARAS, NP  ACCU-CHEK GUIDE test strip 4 (four) times daily. 07/02/20   [provider]  albuterol  (PROVENTIL ) (2.5 MG/3ML) 0.083% nebulizer solution Take 2.5 mg by nebulization every  6 (six) hours as needed for wheezing or shortness of breath.    [provider]  albuterol  (VENTOLIN  HFA) 108 (90 Base) MCG/ACT inhaler Inhale 1-2 puffs into the lungs every 6 (six) hours as needed for wheezing or  shortness of breath. 03/21/21   Stuart Vernell Norris, PA-C  Alcohol Swabs (ALCOHOL PADS) 70 % PADS SMARTSIG:Pledget(s) Topical 4 Times Daily 08/12/20   [provider]  apixaban  (ELIQUIS ) 5 MG TABS tablet Take 1 tablet (5 mg total) by mouth 2 (two) times daily. 05/21/21   Arrien, Elidia Sieving, MD  Blood Glucose Calibration (ACCU-CHEK GUIDE CONTROL) LIQD See admin instructions. 07/02/20   [provider]  Blood Glucose Monitoring Suppl (ACCU-CHEK GUIDE ME) w/Device KIT Use Glucose Meter to monitor blood glucose readings four(4) times daily as directed by provider. 09/23/23   Therisa Benton PARAS, NP  Cetirizine  HCl 10 MG CAPS Take 10 mg by mouth daily.    [provider]  cimetidine  (TAGAMET ) 200 MG tablet Take 0.5 tablets (100 mg total) by mouth daily as needed. 03/22/20   Ricky Fines, MD  CINNAMON PO Take 1-2 capsules by mouth 3 (three) times daily.    [provider]  clobetasol  ointment (TEMOVATE ) 0.05 % Apply 1 application  topically as needed (imflammation). 06/20/15   [provider]  Continuous Glucose Receiver (FREESTYLE LIBRE 3 READER) DEVI USE TO MONITOR GLUCOSE CONTINUOUSLY AS DIRECTED 06/15/23   Therisa Benton PARAS, NP  Continuous Glucose Sensor (FREESTYLE LIBRE 3 PLUS SENSOR) MISC USE TO MONITOR GLUCOSE CONTINUOUSLY AS DIRECTED. CHANGE SENSOR EVERY 15 DAYS. 06/17/23   Therisa Benton PARAS, NP  DULoxetine  (CYMBALTA ) 60 MG capsule Take 60 mg by mouth 2 (two) times daily.    [provider]  EASY COMFORT PEN NEEDLES 31G X 5 MM MISC INJECT IN SULIN EVERY DAY AS DIRECTED 06/02/21   [provider]  Evolocumab  (REPATHA  SURECLICK) 140 MG/ML SOAJ Inject 140 mg into the skin every 14 (fourteen) days. 03/18/23   Whitfield Raisin, NP  fluticasone  (FLONASE ) 50 MCG/ACT nasal spray SMARTSIG:2 Spray(s) Both Nares Daily PRN 04/05/23   [provider]  glucose blood (ACCU-CHEK GUIDE TEST) test strip Use 1 test strip four(4) times daily to monitor  blood glucose as directed by provider. 09/23/23   Therisa Benton PARAS, NP  guaiFENesin  (ROBITUSSIN) 100 MG/5ML SOLN Take 5 mLs (100 mg total) by mouth every 4 (four) hours as needed for cough or to loosen phlegm. Patient taking differently: Take 5 mLs by mouth as needed for cough or to loosen phlegm. 03/17/20   Maree, Pratik D, DO  hydrOXYzine  (ATARAX ) 25 MG tablet Take 1 tablet (25 mg total) by mouth every 8 (eight) hours as needed for itching. 12/06/23   Arrien, Elidia Sieving, MD  insulin  degludec (TRESIBA  FLEXTOUCH) 100 UNIT/ML FlexTouch Pen Inject 15 Units into the skin at bedtime. 06/14/23   Therisa Benton PARAS, NP  KLAYESTA  powder APPLY TOPICALLY THREE TIMES DAILY 09/20/23   Therisa Benton PARAS, NP  Lancet Devices (EASY MINI EJECT LANCING DEVICE) MISC 4 (four) times daily. 07/02/20   [provider]  levothyroxine  (SYNTHROID ) 100 MCG tablet Take 1 tablet (100 mcg total) by mouth daily before breakfast. 10/21/23   Therisa Benton PARAS, NP  lidocaine  (LIDODERM ) 5 % Place 1 patch onto the skin daily. Remove & Discard patch within 12 hours or as directed by MD 01/29/22   Leath-Warren, Etta PARAS, NP  lubiprostone  (AMITIZA ) 24 MCG capsule Take 1 capsule (24 mcg total) by mouth 2 (  two) times daily with a meal. 01/21/22   Ezzard Sonny RAMAN, PA-C  pantoprazole  (PROTONIX ) 40 MG tablet Take 1 tablet (40 mg total) by mouth daily before breakfast. 01/21/22   Ezzard Sonny RAMAN, PA-C  potassium chloride  (KLOR-CON  M) 10 MEQ tablet Take 1 tablet (10 mEq total) by mouth daily. 12/16/23 03/15/24  Hayes Beckey CROME, NP  pregabalin  (LYRICA ) 75 MG capsule Take 1 capsule (75 mg total) by mouth daily. 12/07/23   Arrien, Elidia Sieving, MD  RESTASIS 0.05 % ophthalmic emulsion Place 1 drop into both eyes 2 (two) times daily. 08/21/20   [provider]  Simethicone 125 MG CAPS Take 1 capsule every day by oral route as needed.    [provider]  Skin Protectants, Misc. (INTERDRY 89K855) SHEE Change pad daily or if  soiled 10/21/23   Therisa Benton PARAS, NP  spironolactone (ALDACTONE) 25 MG tablet Take 25 mg by mouth daily.    [provider]  Tiotropium Bromide-Olodaterol (STIOLTO RESPIMAT ) 2.5-2.5 MCG/ACT AERS Inhale 2 puffs into the lungs daily. 03/19/23   Wert, Michael B, MD  tirzepatide  (MOUNJARO ) 12.5 MG/0.5ML Pen Inject 12.5 mg into the skin once a week. 06/14/23   Therisa Benton PARAS, NP  torsemide  60 MG TABS Take 60 mg by mouth 2 (two) times daily. 12/06/23   Arrien, Mauricio Daniel, MD  traMADol  (ULTRAM ) 50 MG tablet Take 1 tablet (50 mg total) by mouth every 8 (eight) hours as needed for severe pain (pain score 7-10). 12/06/23   Arrien, Mauricio Daniel, MD  traZODone  (DESYREL ) 150 MG tablet Take 150 mg by mouth at bedtime. 04/07/22   [provider]  ursodiol (ACTIGALL) 300 MG capsule Take 600 mg by mouth 2 (two) times daily.    [provider]    Current Facility-Administered Medications  Medication Dose Route Frequency Provider Last Rate Last Admin   0.9 %  sodium chloride  infusion (Manually program via Guardrails IV Fluids)   Intravenous Once Goldston, Scott, MD       acetaminophen  (TYLENOL ) tablet 650 mg  650 mg Oral Q6H PRN Shahmehdi, Adriana LABOR, MD       Or   acetaminophen  (TYLENOL ) suppository 650 mg  650 mg Rectal Q6H PRN Shahmehdi, Seyed A, MD       atorvastatin  (LIPITOR ) tablet 80 mg  80 mg Oral Daily Shahmehdi, Seyed A, MD       bisacodyl  (DULCOLAX) EC tablet 5 mg  5 mg Oral Daily PRN Shahmehdi, Seyed A, MD       DULoxetine  (CYMBALTA ) DR capsule 60 mg  60 mg Oral BID Shahmehdi, Seyed A, MD       furosemide  (LASIX ) injection 60 mg  60 mg Intravenous Once Goldston, Scott, MD       hydrALAZINE  (APRESOLINE ) injection 10 mg  10 mg Intravenous Q4H PRN Shahmehdi, Seyed A, MD       HYDROmorphone  (DILAUDID ) injection 0.5-1 mg  0.5-1 mg Intravenous Q2H PRN Shahmehdi, Seyed A, MD       hydrOXYzine  (ATARAX ) tablet 25 mg  25 mg Oral Q8H PRN Shahmehdi, Seyed A, MD        ipratropium (ATROVENT) nebulizer solution 0.5 mg  0.5 mg Nebulization Q6H PRN Shahmehdi, Seyed A, MD       [START ON 12/22/2023] levothyroxine  (SYNTHROID ) tablet 100 mcg  100 mcg Oral QAC breakfast Shahmehdi, Seyed A, MD       ondansetron  (ZOFRAN ) tablet 4 mg  4 mg Oral Q6H PRN Willette Adriana LABOR, MD  Or   ondansetron  (ZOFRAN ) injection 4 mg  4 mg Intravenous Q6H PRN Shahmehdi, Seyed A, MD       oxyCODONE  (Oxy IR/ROXICODONE ) immediate release tablet 5 mg  5 mg Oral Q4H PRN Shahmehdi, Seyed A, MD       pregabalin  (LYRICA ) capsule 75 mg  75 mg Oral Daily Shahmehdi, Seyed A, MD       senna-docusate (Senokot-S) tablet 1 tablet  1 tablet Oral QHS Shahmehdi, Seyed A, MD       sodium chloride  flush (NS) 0.9 % injection 3 mL  3 mL Intravenous Q12H Shahmehdi, Seyed A, MD       sodium chloride  flush (NS) 0.9 % injection 3 mL  3 mL Intravenous Q12H Shahmehdi, Seyed A, MD       sodium phosphate  (FLEET) enema 1 enema  1 enema Rectal Once PRN Shahmehdi, Adriana LABOR, MD       [START ON 12/22/2023] Torsemide  TABS 60 mg  60 mg Oral BID Shahmehdi, Seyed A, MD       traZODone  (DESYREL ) tablet 150 mg  150 mg Oral QHS Shahmehdi, Seyed A, MD       Current Outpatient Medications  Medication Sig Dispense Refill   ascorbic acid  (VITAMIN C ) 500 MG tablet Take 500 mg by mouth daily.     atorvastatin  (LIPITOR ) 80 MG tablet Take 1 tablet (80 mg total) by mouth daily. 30 tablet 0   Accu-Chek FastClix Lancets MISC Use 1 lancet to check blood glucose four (4) times daily as directed by provider. 102 each 5   ACCU-CHEK GUIDE test strip 4 (four) times daily.     albuterol  (PROVENTIL ) (2.5 MG/3ML) 0.083% nebulizer solution Take 2.5 mg by nebulization every 6 (six) hours as needed for wheezing or shortness of breath.     albuterol  (VENTOLIN  HFA) 108 (90 Base) MCG/ACT inhaler Inhale 1-2 puffs into the lungs every 6 (six) hours as needed for wheezing or shortness of breath. 18 g 0   Alcohol Swabs (ALCOHOL PADS) 70 % PADS  SMARTSIG:Pledget(s) Topical 4 Times Daily     apixaban  (ELIQUIS ) 5 MG TABS tablet Take 1 tablet (5 mg total) by mouth 2 (two) times daily.     Blood Glucose Calibration (ACCU-CHEK GUIDE CONTROL) LIQD See admin instructions.     Blood Glucose Monitoring Suppl (ACCU-CHEK GUIDE ME) w/Device KIT Use Glucose Meter to monitor blood glucose readings four(4) times daily as directed by provider. 1 kit 0   Cetirizine  HCl 10 MG CAPS Take 10 mg by mouth daily.     cimetidine  (TAGAMET ) 200 MG tablet Take 0.5 tablets (100 mg total) by mouth daily as needed.     CINNAMON PO Take 1-2 capsules by mouth 3 (three) times daily.     clobetasol  ointment (TEMOVATE ) 0.05 % Apply 1 application  topically as needed (imflammation).     Continuous Glucose Receiver (FREESTYLE LIBRE 3 READER) DEVI USE TO MONITOR GLUCOSE CONTINUOUSLY AS DIRECTED 1 each 0   Continuous Glucose Sensor (FREESTYLE LIBRE 3 PLUS SENSOR) MISC USE TO MONITOR GLUCOSE CONTINUOUSLY AS DIRECTED. CHANGE SENSOR EVERY 15 DAYS. 6 each 1   DULoxetine  (CYMBALTA ) 60 MG capsule Take 60 mg by mouth 2 (two) times daily.     EASY COMFORT PEN NEEDLES 31G X 5 MM MISC INJECT IN SULIN EVERY DAY AS DIRECTED     Evolocumab  (REPATHA  SURECLICK) 140 MG/ML SOAJ Inject 140 mg into the skin every 14 (fourteen) days. 2 mL 3   fluticasone  (FLONASE ) 50 MCG/ACT nasal  spray SMARTSIG:2 Spray(s) Both Nares Daily PRN     glucose blood (ACCU-CHEK GUIDE TEST) test strip Use 1 test strip four(4) times daily to monitor blood glucose as directed by provider. 200 each 5   guaiFENesin  (ROBITUSSIN) 100 MG/5ML SOLN Take 5 mLs (100 mg total) by mouth every 4 (four) hours as needed for cough or to loosen phlegm. (Patient taking differently: Take 5 mLs by mouth as needed for cough or to loosen phlegm.) 236 mL 0   hydrOXYzine  (ATARAX ) 25 MG tablet Take 1 tablet (25 mg total) by mouth every 8 (eight) hours as needed for itching. 30 tablet 0   insulin  degludec (TRESIBA  FLEXTOUCH) 100 UNIT/ML FlexTouch  Pen Inject 15 Units into the skin at bedtime. 15 mL 3   KLAYESTA  powder APPLY TOPICALLY THREE TIMES DAILY 15 g 0   Lancet Devices (EASY MINI EJECT LANCING DEVICE) MISC 4 (four) times daily.     levothyroxine  (SYNTHROID ) 100 MCG tablet Take 1 tablet (100 mcg total) by mouth daily before breakfast. 90 tablet 1   lidocaine  (LIDODERM ) 5 % Place 1 patch onto the skin daily. Remove & Discard patch within 12 hours or as directed by MD 30 patch 0   lubiprostone  (AMITIZA ) 24 MCG capsule Take 1 capsule (24 mcg total) by mouth 2 (two) times daily with a meal. 60 capsule 11   pantoprazole  (PROTONIX ) 40 MG tablet Take 1 tablet (40 mg total) by mouth daily before breakfast. 90 tablet 3   potassium chloride  (KLOR-CON  M) 10 MEQ tablet Take 1 tablet (10 mEq total) by mouth daily. 90 tablet 3   pregabalin  (LYRICA ) 75 MG capsule Take 1 capsule (75 mg total) by mouth daily. 30 capsule 0   RESTASIS 0.05 % ophthalmic emulsion Place 1 drop into both eyes 2 (two) times daily.     Simethicone 125 MG CAPS Take 1 capsule every day by oral route as needed.     Skin Protectants, Misc. (INTERDRY 10X144) SHEE Change pad daily or if soiled 1 each 0   spironolactone (ALDACTONE) 25 MG tablet Take 25 mg by mouth daily.     Tiotropium Bromide-Olodaterol (STIOLTO RESPIMAT ) 2.5-2.5 MCG/ACT AERS Inhale 2 puffs into the lungs daily. 1 each    tirzepatide  (MOUNJARO ) 12.5 MG/0.5ML Pen Inject 12.5 mg into the skin once a week. 6 mL 3   torsemide  60 MG TABS Take 60 mg by mouth 2 (two) times daily. 60 tablet 0   traMADol  (ULTRAM ) 50 MG tablet Take 1 tablet (50 mg total) by mouth every 8 (eight) hours as needed for severe pain (pain score 7-10). 10 tablet 0   traZODone  (DESYREL ) 150 MG tablet Take 150 mg by mouth at bedtime.     ursodiol (ACTIGALL) 300 MG capsule Take 300 mg by mouth 2 (two) times daily. (Patient taking differently: Take 600 mg by mouth 2 (two) times daily.)      Allergies as of 12/21/2023 - Review Complete 12/21/2023   Allergen Reaction Noted   Tetracyclines & related Anaphylaxis, Dermatitis, and Rash 09/19/2010   Banana Hives and Nausea And Vomiting 05/04/2013   Jardiance  [empagliflozin ] Other (See Comments) 11/17/2023   Penicillins Rash 09/19/2010    Past Medical History:  Diagnosis Date   Anemia    Arthritis    Asthma    COPD (chronic obstructive pulmonary disease) (HCC)    COVID-19    Deep vein thrombosis (DVT) of both lower extremities (HCC) 06/27/2015   Fibromyalgia    GERD (gastroesophageal reflux disease)  H/O hiatal hernia    Hypercholesteremia    Hypertension    Hyperthyroidism    IBS (irritable bowel syndrome)    Inappropriate sinus tachycardia    Inner ear disease    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   Type 2 diabetes mellitus (HCC)     Past Surgical History:  Procedure Laterality Date   ABDOMINAL HYSTERECTOMY  partial   CARPAL TUNNEL RELEASE Right 1991   CATARACT EXTRACTION W/PHACO Right 05/08/2013   Procedure: CATARACT EXTRACTION PHACO AND INTRAOCULAR LENS PLACEMENT (IOC);  Surgeon: Cherene Mania, MD;  Location: [facility];  Service: Ophthalmology;  Laterality: Right;  CDE 10.31   CATARACT EXTRACTION W/PHACO Left 08/17/2013   Procedure: CATARACT EXTRACTION PHACO AND INTRAOCULAR LENS PLACEMENT (IOC);  Surgeon: Cherene Mania, MD;  Location: [facility];  Service: Ophthalmology;  Laterality: Left;  CDE:9.03   CHOLECYSTECTOMY  1971   COLONOSCOPY WITH PROPOFOL  N/A 01/06/2016   Dr. Shaaron: diverticulosis    DENTAL SURGERY     ESOPHAGEAL BRUSHING  08/29/2019   Procedure: ESOPHAGEAL BRUSHING;  Surgeon: Shaaron Lamar HERO, MD;  Location: [facility];  Service: Endoscopy;;   ESOPHAGOGASTRODUODENOSCOPY (EGD) WITH PROPOFOL  N/A 01/06/2016   Dr. Shaaron: normal s/p empiric dilation    ESOPHAGOGASTRODUODENOSCOPY (EGD) WITH PROPOFOL  N/A 08/29/2019   esophageal plaques vs medication residue adherent to tubular esophagus s/p KOH brushing and dilation. Medium-sized hiatal hernia. +  for candida. Diflucan .    EYE SURGERY     IR BONE MARROW BIOPSY & ASPIRATION  03/31/2022   MALONEY DILATION N/A 01/06/2016   Procedure: AGAPITO DILATION;  Surgeon: Lamar HERO Shaaron, MD;  Location: [facility];  Service: Endoscopy;  Laterality: N/A;   MALONEY DILATION N/A 08/29/2019   Procedure: AGAPITO DILATION;  Surgeon: Shaaron Lamar HERO, MD;  Location: [facility];  Service: Endoscopy;  Laterality: N/A;   REVERSE SHOULDER ARTHROPLASTY Right 08/06/2017   Procedure: RIGHT REVERSE SHOULDER ARTHROPLASTY;  Surgeon: Kay Kemps, MD;  Location: [facility];  Service: Orthopedics;  Laterality: Right;   RIGHT HEART CATH N/A 11/26/2023   Procedure: RIGHT HEART CATH;  Surgeon: Cherrie Toribio SAUNDERS, MD;  Location: [facility];  Service: Cardiovascular;  Laterality: N/A;   WRIST GANGLION EXCISION Left     Family History  Problem Relation Age of Onset   Hypertension Mother    Diabetes Mother    COPD Mother    Arthritis Mother    Diabetes Father    Arthritis Father    Dementia Father    CAD Father    Hypothyroidism Sister    Diabetes Brother    Stroke Paternal Aunt    Colon cancer Niece    Colon polyps Neg Hx    Sleep apnea Neg Hx     Social History   Socioeconomic History   Marital status: Divorced    Spouse name: Not on file   Number of children: Not on file   Years of education: Not on file   Highest education level: Not on file  Occupational History   Not on file  Tobacco Use   Smoking status: Former    Current packs/day: 0.00    Average packs/day: 0.3 packs/day for 30.0 years (7.5 ttl pk-yrs)    Types: Cigarettes    Start date: 02/16/1981    Quit date: 02/17/2011    Years since quitting: 12.8   Smokeless tobacco: Never  Vaping Use   Vaping status: Never Used  Substance and Sexual Activity   Alcohol use: Not  Currently    Comment: rare   Drug use: No   Sexual activity: Not Currently    Birth control/protection: Surgical  Other Topics Concern   Not on file  Social  History Narrative   Pt lives with daughter    Retired    Social Drivers of Corporate Investment Banker Strain: Not on file  Food Insecurity: No Food Insecurity (11/17/2023)   Hunger Vital Sign    Worried About Running Out of Food in the Last Year: Never true    Ran Out of Food in the Last Year: Never true  Transportation Needs: No Transportation Needs (11/17/2023)   PRAPARE - Administrator, Civil Service (Medical): No    Lack of Transportation (Non-Medical): No  Physical Activity: Not on file  Stress: Not on file  Social Connections: Moderately Isolated (11/17/2023)   Social Connection and Isolation Panel    Frequency of Communication with Friends and Family: More than three times a week    Frequency of Social Gatherings with Friends and Family: More than three times a week    Attends Religious Services: 1 to 4 times per year    Active Member of [employer] or Organizations: No    Attends Banker Meetings: Never    Marital Status: Divorced  Catering Manager Violence: Not At Risk (11/17/2023)   Humiliation, Afraid, Rape, and Kick questionnaire    Fear of Current or Ex-Partner: No    Emotionally Abused: No    Physically Abused: No    Sexually Abused: No     Review of System:  History limited today by lethargy. General: Negative for anorexia, weight loss, fever, chills, fatigue, +weakness. Eyes: Negative for vision changes.  ENT: Negative for hoarseness, +difficulty swallowing , nasal congestion.see hpi CV: Negative for chest pain, angina, palpitations, +dyspnea on exertion, peripheral edema.  Respiratory: Negative for dyspnea at rest,+ dyspnea on exertion, cough, sputum, wheezing.  GI: See history of present illness. GU:  Negative for dysuria, hematuria, urinary incontinence, urinary frequency, nocturnal urination.  MS: Negative for joint pain, low back pain.  Derm: Negative for rash or itching.  Neuro: Negative for weakness, abnormal sensation, seizure,  frequent headaches, memory loss, confusion.  Psych: Negative for anxiety, depression, suicidal ideation, hallucinations.  Endo: Negative for unusual weight change.  Heme: Negative for bruising or bleeding. Allergy : Negative for rash or hives.      Physical Examination:   Vital signs in last 24 hours: Temp:  [99.6 F (37.6 C)] 99.6 F (37.6 C) (11/04 1231) Pulse Rate:  [104] 104 (11/04 1235) Resp:  [17-22] 22 (11/04 1235) BP: (119)/(70) 119/70 (11/04 1235) SpO2:  [100 %] 100 % (11/04 1235) Weight:  [84 kg] 84 kg (11/04 1228)    General: chronically ill appearing female in NAD Head: Normocephalic, atraumatic.   Eyes: Conjunctiva pale, no icterus. Mouth: Oropharyngeal mucosa moist and pink , no lesions erythema or exudate. Neck: Supple without thyromegaly, masses, or lymphadenopathy.  Lungs: Clear to auscultation bilaterally.  Heart: Regular rate and rhythm, no murmurs rubs or gallops.  Abdomen: Bowel sounds are normal, nontender, nondistended, no hepatosplenomegaly or masses, no abdominal bruits or hernia , no rebound or guarding.   Rectal: performed by EDP, brown stool, heme positive. Completed by GI [facility] last admission, unremarkable exam. Extremities: No lower extremity edema, clubbing, deformity.  Neuro: Alert and oriented x 3 , grossly normal neurologically. lethargic Skin: Warm and dry, no rash or jaundice.   Psych: Alert and cooperative, normal  mood and affect.        Intake/Output from previous day: No intake/output data recorded. Intake/Output this shift: No intake/output data recorded.  Lab Results:   CBC Recent Labs    12/21/23 1251  WBC 9.9  HGB 6.4*  HCT 22.0*  MCV 92.1  PLT 177   BMET Recent Labs    12/21/23 1251  NA 140  K 4.4  CL 103  CO2 27  GLUCOSE 110*  BUN 52*  CREATININE 1.97*  CALCIUM  9.1   LFT Recent Labs    12/21/23 1251  BILITOT 1.3*  ALKPHOS 212*  AST 45*  ALT 16  PROT 8.1  ALBUMIN 3.1*   Lab Results  Component Value  Date   IRON  95 11/29/2023   TIBC 206 (L) 11/29/2023   FERRITIN 324 (H) 11/29/2023   Lab Results  Component Value Date   VITAMINB12 1,862 (H) 11/29/2023   Lab Results  Component Value Date   FOLATE 9.1 11/29/2023     Lipase No results for input(s): LIPASE in the last 72 hours.  PT/INR Recent Labs    12/21/23 1251  LABPROT 22.6*  INR 1.9*     Hepatitis Panel No results for input(s): HEPBSAG, HCVAB, HEPAIGM, HEPBIGM in the last 72 hours.   Imaging Studies:   CT Head Wo Contrast Result Date: 12/21/2023 CLINICAL DATA:  Altered mental status. EXAM: CT HEAD WITHOUT CONTRAST TECHNIQUE: Contiguous axial images were obtained from the base of the skull through the vertex without intravenous contrast. RADIATION DOSE REDUCTION: This exam was performed according to the departmental dose-optimization program which includes automated exposure control, adjustment of the mA and/or kV according to patient size and/or use of iterative reconstruction technique. COMPARISON:  09/15/2017 FINDINGS: Brain: Ventricles, cisterns and other CSF spaces are normal. There is no mass, mass effect, shift of midline structures or acute hemorrhage. There is mild chronic ischemic microvascular disease. No acute infarction. Vascular: No hyperdense vessel or unexpected calcification. Skull: Normal. Negative for fracture or focal lesion. Sinuses/Orbits: No acute finding. Other: None. IMPRESSION: 1. No acute findings. 2. Mild chronic ischemic microvascular disease. Electronically Signed   By: Toribio Agreste M.D.   On: 12/21/2023 14:07   DG Chest Port 1 View Result Date: 12/21/2023 EXAM: 1 VIEW(S) XRAY OF THE CHEST 12/21/2023 01:12:00 PM COMPARISON: 12/01/2023 CLINICAL HISTORY: Questionable sepsis - evaluate for abnormality FINDINGS: LINES, TUBES AND DEVICES: Right PICC line removed. LUNGS AND PLEURA: Vascular congestion and interstitial pulmonary edema Possible small bilateral pleural effusions. No pneumothorax.  HEART AND MEDIASTINUM: Heart size at upper limits of normal but stable. BONES AND SOFT TISSUES: Right shoulder arthroplasty noted. IMPRESSION: 1. Pulmonary edema and small bilateral pleural effusions. Electronically signed by: Maude Stammer MD 12/21/2023 01:43 PM EST RP Workstation: [id number]   US  EKG Site Rite Result Date: 12/01/2023 If Site Rite image not attached, placement could not be confirmed due to current cardiac rhythm.  DG CHEST PORT 1 VIEW Result Date: 12/01/2023 CLINICAL DATA:  PICC line placement. EXAM: PORTABLE CHEST 1 VIEW COMPARISON:  11/24/2023 FINDINGS: The cardio pericardial silhouette is enlarged. There is pulmonary vascular congestion without overt pulmonary edema. Right PICC line tip overlies the right lung apex compatible with placement in the subclavian vein. Telemetry leads overlie the chest. IMPRESSION: Right PICC line tip overlies the right lung apex compatible with placement in the subclavian vein. If this is the same PICC line that was present on the study from 1 week ago, it has been pulled back in the interval.  These results will be called to the ordering clinician or representative by the Radiologist Assistant, and communication documented in the PACS or [employer]. Electronically Signed   By: Camellia Candle M.D.   On: 12/01/2023 07:13   US  EKG SITE RITE Result Date: 11/29/2023 If Site Rite image not attached, placement could not be confirmed due to current cardiac rhythm.  CARDIAC CATHETERIZATION Result Date: 11/26/2023 Findings: RA =  27 RV = 52/26 PA =  48/26 (36) PCW = 22 Fick cardiac output/index = 6.3/2.8 Thermo CO/CI = 4.0/1.8 PVR = 2.2 (Fick) 3.5 (TD) Ao sat = 97% PA sat = 62%, 64% PAPi = 0.81 Assessment: 1. 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 Can consider inpatient trial of milrinone and IV diuresis to optimize but would  strongly consider Palliative Care involvement. Toribio Fuel, MD 8:53 AM  DG Abd 1 View Result Date: 11/25/2023 CLINICAL DATA:  Abdominal pain. EXAM: ABDOMEN - 1 VIEW COMPARISON:  Chest, abdomen and pelvis CT 09/21/2022. Right upper quadrant abdominal ultrasound dated 09/14/2023. FINDINGS: Normal bowel-gas pattern. Lumbar and lower thoracic spine degenerative changes. No visible urinary tract calculi. IMPRESSION: No acute abnormality. Electronically Signed   By: Elspeth Bathe M.D.   On: 11/25/2023 11:31   DG CHEST PORT 1 VIEW Result Date: 11/24/2023 EXAM: 1 VIEW XRAY OF THE CHEST 11/24/2023 09:32:00 AM COMPARISON: 11/16/2023 CLINICAL HISTORY: Heart failure, HTN, diabetes. FINDINGS: LUNGS AND PLEURA: Mild bilateral interstitial opacities suggestive of interstitial edema, with indistinct pulmonary vasculature. HEART AND MEDIASTINUM: Stable cardiomegaly, mild enlargement of the cardiopericardial silhouette. BONES AND SOFT TISSUES: Degenerative glenohumeral arthropathy on the left, thoracic spondylosis, status post right shoulder arthroplasty. Right PICC in place with tip terminating over the SVC. IMPRESSION: 1. Pulmonary interstitial edema. 2. Stable cardiomegaly. 3. Right PICC with tip over the SVC. 4. Degenerative left glenohumeral arthropathy. 5. Thoracic spondylosis. 6. Status post right shoulder arthroplasty. 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   Acute on chronic anemia/Hemoccult positive stool: -Hgb 6.4 on  admission, down from 9.1 on 12/06/23 -recent small volume rectal bleeding while inpatient in 11/2023, seen by GI in [place], decision made to avoid endoscopic evaluation -last dose of Eliquis  today at 11:40am -no reported melena or recent rectal bleeding. On DRE, stool brown and hemoccult positive -BUN 52/Cre 1.97 with increases of BUN above baseline -ultrasound 08/2023 concerning for cirrhosis with slightly lobulated liver border, spleen normal in size.  -notably, patient has dropped 50 pounds since her last visit with hepatology in 04/2023 (weighed 107 kg at that time). She is on tirzepatide . She has had significant decline requiring SNF placement after last hospitalization.  She could have occult bleeding from anywhere within GI tract. Would be concerned for possible portal HTN gastropathy given possible cirrhosis on most recent u/s. Differential also includes gastritis, ulcers, angioectasias, and malignancy.  Ideally EGD but she is considered high risk.   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   Plan:   Pantoprazole  40mg  IV BID Hold Eliquis  for now Transfuse as needed. Monitor for overt GI bleeding. Check ammonia level. Diet when alert.  It is not clear that EGD can be performed on this nice lady given her significant end stage heart failure. To discuss plan with Dr. Cinderella. Without overt GI bleeding, CTA GI bleeding scan would not likely be useful and is complicated by elevated creatinine/low GFR.     LOS: 0 days   We would like to thank you for the opportunity to participate in  the care of Bryley Chrisman Rodwell.  Sonny RAMAN. Ezzard RIGGERS [facility] [phone] 11/4/20253:16 PM

## 2023-12-21 NOTE — Assessment & Plan Note (Addendum)
-   Acute GI bleed in the setting of chronic anticoagulation with Eliquis     Latest Ref Rng & Units 12/21/2023   12:51 PM 12/06/2023   12:10 AM 12/04/2023    5:00 AM  CBC  WBC 4.0 - 10.5 K/uL 9.9  10.2  10.1   Hemoglobin 12.0 - 15.0 g/dL 6.4  9.1  8.4   Hematocrit 36.0 - 46.0 % 22.0  29.4  26.1   Platelets 150 - 400 K/uL 177  124  118    - Will proceed with 2U PRBC blood transfusion -Occult positive, -Holding home dose Eliquis  -Continuing IV Protonix  40 mg twice daily -GI consulted, appreciate further evaluation recommendation

## 2023-12-21 NOTE — Assessment & Plan Note (Signed)
 Acute on chronic anemia due to chronic disease and CKD -Acute anemia due to blood loss - GI bleed -Holding home dose Eliquis  -Pursuing with 2U PRBC blood transfusion

## 2023-12-21 NOTE — Assessment & Plan Note (Signed)
-   With minimal residual finding, once stable resuming secondary prevention including Eliquis , lipid-lowering medication including statins, Repatha ,

## 2023-12-21 NOTE — H&P (Addendum)
 History and Physical   Patient: Stephanie [name]                            PCP: [facility]                    DOB: [birth year]            DOA: 12/21/2023 FMW:[id number]             DOS: 12/21/2023, 3:51 PM  [facility], [facility]  Patient coming from:   HOME  I have personally reviewed patient's medical records, in electronic medical records, including:  [facility], and care everywhere.    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 Presenting from [facility] for chief complaint of altered mental status with confusion..  Patient is awake but poor historian per record-at the facility patient was found more lethargic this morning stating she was not feeling good, normally she is awake alert. No reported fever chills nausea vomiting, or active bleeding.    ED Evaluation: Blood pressure 123/75, pulse (!) 104, temperature 98.9 F (37.2 C), temperature source Axillary, resp. rate (!) 21, height 5' 8 (1.727 m), weight 84 kg, SpO2 100%. Labs: CMP within normal exception of glucose 110, BUN 52, creatinine 1.97, alk phos 212, albumin 2.1, AST 45, T. bili 1.3, GFR 26, BNP 1088.4, lactic acid 1.9,  CBC: Hemoglobin of 6.4.  Platelets 117 Respiratory viral panel negative Hemoccult positive  CTA head chronic microvascular disease otherwise within normal limits, chest x-ray clear   EDP consulted gastroenterologist, ordered 2 units of PRBC to be transfused, 60 mg of IV Lasix  was given.  Requested patient to be admitted for further evaluation.    Patient Denies having: Fever, Chills, Cough, SOB, Chest Pain, Abd pain, N/V/D, headache, dizziness, lightheadedness,  Dysuria, Joint  pain, rash, open wounds   Review of Systems: As per HPI, otherwise 10 point review of systems were negative.   ----------------------------------------------------------------------------------------------------------------------  Allergies  Allergen Reactions   Tetracyclines & Related Anaphylaxis, Dermatitis and Rash   Banana Hives and Nausea And Vomiting   Jardiance  [Empagliflozin ] Other (See Comments)    Yeast infections, fatigue   Penicillins Rash    Home MEDs:  Prior to Admission medications   Medication Sig Start Date End Date Taking? Authorizing Provider  albuterol  (PROVENTIL ) (2.5 MG/3ML) 0.083% nebulizer solution Take 2.5 mg by nebulization every 6 (six) hours as needed for wheezing or shortness of breath.   Yes [provider]  albuterol  (VENTOLIN  HFA) 108 (90 Base) MCG/ACT inhaler Inhale 1-2 puffs into the lungs every 6 (six) hours as needed for wheezing or shortness of breath. 03/21/21  Yes Stuart Vernell Norris, PA-C  aluminum-magnesium  hydroxide-simethicone (MAALOX) 200-200-20 MG/5ML SUSP Take 5 mLs by mouth as needed (GERD).   Yes [provider]  apixaban  (ELIQUIS ) 5 MG TABS tablet Take 1 tablet (5 mg total) by mouth 2 (two) times daily. Patient taking differently: Take 5 mg by mouth 2 (two) times daily. 0800, 2000 05/21/21  Yes Arrien, Elidia Sieving, MD  ascorbic acid  (VITAMIN C ) 500 MG tablet Take 500 mg by mouth  daily.   Yes [provider]  atorvastatin  (LIPITOR ) 80 MG tablet Take 1 tablet (80 mg total) by mouth daily. 05/22/21 06/24/58 Yes Arrien, Elidia Sieving, MD  cetirizine  (ZYRTEC ) 10 MG tablet Take 10 mg by mouth daily.   Yes [provider]  cholecalciferol  (VITAMIN D3) 25 MCG (1000 UNIT) tablet Take 3,000 Units by mouth daily.   Yes [provider]  cimetidine  (TAGAMET ) 200 MG tablet Take 0.5 tablets (100 mg total) by mouth daily as needed. Patient taking differently: Take 100 mg by mouth daily as needed (heartburn).  03/22/20  Yes Ricky Fines, MD  CINNAMON PO Take 2 capsules by mouth 3 (three) times daily.   Yes [provider]  clobetasol  ointment (TEMOVATE ) 0.05 % Apply 1 application  topically as needed (inflammation). 06/20/15  Yes [provider]  DULoxetine  (CYMBALTA ) 60 MG capsule Take 60 mg by mouth 2 (two) times daily.   Yes [provider]  Evolocumab  (REPATHA  SURECLICK) 140 MG/ML SOAJ Inject 140 mg into the skin every 14 (fourteen) days. 03/18/23  Yes McCue, Harlene, NP  fluticasone  (FLONASE ) 50 MCG/ACT nasal spray Place 2 sprays into both nostrils as needed (congestion). 04/05/23  Yes [provider]  guaiFENesin  (ROBITUSSIN) 100 MG/5ML SOLN Take 5 mLs (100 mg total) by mouth every 4 (four) hours as needed for cough or to loosen phlegm. Patient taking differently: Take 10 mLs by mouth every 4 (four) hours as needed for cough. 03/17/20  Yes Shah, Pratik D, DO  hydrOXYzine  (ATARAX ) 25 MG tablet Take 1 tablet (25 mg total) by mouth every 8 (eight) hours as needed for itching. 12/06/23  Yes Arrien, Mauricio Daniel, MD  insulin  glargine (LANTUS ) 100 UNIT/ML injection Inject 15 Units into the skin at bedtime. 2200   Yes [provider]  levothyroxine  (SYNTHROID ) 100 MCG tablet Take 1 tablet (100 mcg total) by mouth daily before breakfast. 10/21/23  Yes Reardon, Benton J, NP  lidocaine  (LIDODERM ) 5 % Place 1 patch onto the skin daily. Remove & Discard patch within 12 hours or as directed by MD 01/29/22  Yes Leath-Warren, Etta PARAS, NP  lubiprostone  (AMITIZA ) 24 MCG capsule Take 1 capsule (24 mcg total) by mouth 2 (two) times daily with a meal. Patient taking differently: Take 24 mcg by mouth every 12 (twelve) hours as needed for constipation. 01/21/22  Yes Ezzard Sonny RAMAN, PA-C  methocarbamol  (ROBAXIN ) 500 MG tablet Take 500 mg by mouth every 8 (eight) hours as needed for muscle spasms.   Yes [provider]  omeprazole (PRILOSEC OTC) 20 MG tablet Take 20 mg by  mouth daily. 12/18/23 12/30/23 Yes [provider]  pantoprazole  (PROTONIX ) 40 MG tablet Take 1 tablet (40 mg total) by mouth daily before breakfast. 01/21/22  Yes Ezzard Sonny RAMAN, PA-C  potassium chloride  (KLOR-CON  M) 10 MEQ tablet Take 1 tablet (10 mEq total) by mouth daily. 12/16/23 03/15/24 Yes Hayes Beckey CROME, NP  potassium chloride  SA (KLOR-CON  M) 20 MEQ tablet Take 20 mEq by mouth once. 12/21/23 12/21/23 Yes [provider]  pregabalin  (LYRICA ) 75 MG capsule Take 1 capsule (75 mg total) by mouth daily. 12/07/23  Yes Arrien, Elidia Sieving, MD  RESTASIS 0.05 % ophthalmic emulsion Place 1 drop into both eyes 2 (two) times daily. 08/21/20  Yes [provider]  Simethicone 125 MG CAPS Take 1 capsule by mouth daily as needed (gas).   Yes [provider]  spironolactone (ALDACTONE) 25 MG tablet Take 25 mg by mouth daily.  Yes [provider]  Tiotropium Bromide-Olodaterol (STIOLTO RESPIMAT ) 2.5-2.5 MCG/ACT AERS Inhale 2 puffs into the lungs daily. 03/19/23  Yes Darlean Ozell NOVAK, MD  tirzepatide  (MOUNJARO ) 12.5 MG/0.5ML Pen Inject 12.5 mg into the skin once a week. Patient taking differently: Inject 12.5 mg into the skin every Monday. 06/14/23  Yes Therisa Benton PARAS, NP  torsemide  60 MG TABS Take 60 mg by mouth 2 (two) times daily. 12/06/23  Yes Arrien, Mauricio Daniel, MD  traMADol  (ULTRAM ) 50 MG tablet Take 1 tablet (50 mg total) by mouth every 8 (eight) hours as needed for severe pain (pain score 7-10). 12/06/23  Yes Arrien, Elidia Sieving, MD  traZODone  (DESYREL ) 150 MG tablet Take 150 mg by mouth every evening. 1900 04/07/22  Yes [provider]  ursodiol (ACTIGALL) 300 MG capsule Take 300 mg by mouth 2 (two) times daily.   Yes [provider]  Accu-Chek FastClix Lancets MISC Use 1 lancet to check blood glucose four (4) times daily as directed by provider. 09/23/23   Therisa Benton PARAS, NP  ACCU-CHEK GUIDE test strip 4 (four) times daily.  07/02/20   [provider]  Alcohol Swabs (ALCOHOL PADS) 70 % PADS SMARTSIG:Pledget(s) Topical 4 Times Daily 08/12/20   [provider]  Blood Glucose Calibration (ACCU-CHEK GUIDE CONTROL) LIQD See admin instructions. 07/02/20   [provider]  Blood Glucose Monitoring Suppl (ACCU-CHEK GUIDE ME) w/Device KIT Use Glucose Meter to monitor blood glucose readings four(4) times daily as directed by provider. 09/23/23   Therisa Benton PARAS, NP  Continuous Glucose Receiver (FREESTYLE LIBRE 3 READER) DEVI USE TO MONITOR GLUCOSE CONTINUOUSLY AS DIRECTED 06/15/23   Therisa Benton PARAS, NP  Continuous Glucose Sensor (FREESTYLE LIBRE 3 PLUS SENSOR) MISC USE TO MONITOR GLUCOSE CONTINUOUSLY AS DIRECTED. CHANGE SENSOR EVERY 15 DAYS. 06/17/23   Therisa Benton PARAS, NP  EASY COMFORT PEN NEEDLES 31G X 5 MM MISC INJECT IN SULIN EVERY DAY AS DIRECTED 06/02/21   [provider]  glucose blood (ACCU-CHEK GUIDE TEST) test strip Use 1 test strip four(4) times daily to monitor blood glucose as directed by provider. 09/23/23   Therisa Benton PARAS, NP  Lancet Devices (EASY MINI EJECT LANCING DEVICE) MISC 4 (four) times daily. 07/02/20   [provider]  Skin Protectants, Misc. (INTERDRY [id number]) SHEE Change pad daily or if soiled 10/21/23   Reardon, Benton PARAS, NP    PRN MEDs: acetaminophen  **OR** acetaminophen , bisacodyl , hydrALAZINE , HYDROmorphone  (DILAUDID ) injection, ipratropium, ondansetron  **OR** ondansetron  (ZOFRAN ) IV, oxyCODONE , sodium phosphate   Past Medical History:  Diagnosis Date   Anemia    Arthritis    Asthma    COPD (chronic obstructive pulmonary disease) (HCC)    COVID-19    Deep vein thrombosis (DVT) of both lower extremities (HCC) 06/27/2015   Fibromyalgia    GERD (gastroesophageal reflux disease)    H/O hiatal hernia    Hypercholesteremia    Hypertension    Hyperthyroidism    IBS (irritable bowel syndrome)    Inappropriate sinus tachycardia    Inner ear disease     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   Type 2 diabetes mellitus (HCC)     Past Surgical History:  Procedure Laterality Date   ABDOMINAL HYSTERECTOMY  partial   CARPAL TUNNEL RELEASE Right 1991   CATARACT EXTRACTION W/PHACO Right 05/08/2013   Procedure: CATARACT EXTRACTION PHACO AND INTRAOCULAR LENS PLACEMENT (IOC);  Surgeon: Cherene Mania, MD;  Location: [facility];  Service: Ophthalmology;  Laterality: Right;  CDE 10.31  CATARACT EXTRACTION W/PHACO Left 08/17/2013   Procedure: CATARACT EXTRACTION PHACO AND INTRAOCULAR LENS PLACEMENT (IOC);  Surgeon: Cherene Mania, MD;  Location: [facility];  Service: Ophthalmology;  Laterality: Left;  CDE:9.03   CHOLECYSTECTOMY  1971   COLONOSCOPY WITH PROPOFOL  N/A 01/06/2016   Dr. Shaaron: diverticulosis    DENTAL SURGERY     ESOPHAGEAL BRUSHING  08/29/2019   Procedure: ESOPHAGEAL BRUSHING;  Surgeon: Shaaron Lamar HERO, MD;  Location: [facility];  Service: Endoscopy;;   ESOPHAGOGASTRODUODENOSCOPY (EGD) WITH PROPOFOL  N/A 01/06/2016   Dr. Shaaron: normal s/p empiric dilation    ESOPHAGOGASTRODUODENOSCOPY (EGD) WITH PROPOFOL  N/A 08/29/2019   esophageal plaques vs medication residue adherent to tubular esophagus s/p KOH brushing and dilation. Medium-sized hiatal hernia. + for candida. Diflucan .    EYE SURGERY     IR BONE MARROW BIOPSY & ASPIRATION  03/31/2022   MALONEY DILATION N/A 01/06/2016   Procedure: AGAPITO DILATION;  Surgeon: Lamar HERO Shaaron, MD;  Location: [facility];  Service: Endoscopy;  Laterality: N/A;   MALONEY DILATION N/A 08/29/2019   Procedure: AGAPITO DILATION;  Surgeon: Shaaron Lamar HERO, MD;  Location: [facility];  Service: Endoscopy;  Laterality: N/A;   REVERSE SHOULDER ARTHROPLASTY Right 08/06/2017   Procedure: RIGHT REVERSE SHOULDER ARTHROPLASTY;  Surgeon: Kay Kemps, MD;  Location[facility] OR;  Service: Orthopedics;  Laterality: Right;   RIGHT HEART CATH N/A 11/26/2023   Procedure: RIGHT HEART CATH;  Surgeon: Cherrie Toribio SAUNDERS, MD;  Location: [facility];  Service: Cardiovascular;  Laterality: N/A;   WRIST GANGLION EXCISION Left      reports that she quit smoking about 12 years ago. Her smoking use included cigarettes. She started smoking about 42 years ago. She has a 7.5 pack-year smoking history. She has never used smokeless tobacco. She reports that she does not currently use alcohol. She reports that she does not use drugs.   Family History  Problem Relation Age of Onset   Hypertension Mother    Diabetes Mother    COPD Mother    Arthritis Mother    Diabetes Father    Arthritis Father    Dementia Father    CAD Father    Hypothyroidism Sister    Diabetes Brother    Stroke Paternal Aunt    Colon cancer Niece    Colon polyps Neg Hx    Sleep apnea Neg Hx     Physical Exam:   Vitals:   12/21/23 1230 12/21/23 1231 12/21/23 1235 12/21/23 1521  BP:   119/70 123/75  Pulse:   (!) 104 (!) 104  Resp: 17  (!) 22 (!) 21  Temp:  99.6 F (37.6 C)  98.9 F (37.2 C)  TempSrc:  Rectal  Axillary  SpO2:   100% 100%  Weight:      Height:       Constitutional: NAD, calm, comfortable Eyes: PERRL, lids and conjunctivae normal ENMT: Mucous membranes are moist. Posterior pharynx clear of any exudate or lesions.Normal dentition.  Neck: normal, supple, no masses, no thyromegaly Respiratory: clear to auscultation bilaterally, no wheezing, no crackles. Normal respiratory effort. No accessory muscle use.  Cardiovascular: Regular rate and rhythm, no murmurs / rubs / gallops. No extremity edema. 2+ pedal pulses. No carotid bruits.  Abdomen: no tenderness, no masses palpated. No hepatosplenomegaly. Bowel sounds positive.  Musculoskeletal: Generalized weakness is noted - no clubbing / cyanosis. No joint deformity upper and lower extremities. Good ROM, no contractures. Normal muscle tone.  Neurologic: CN II-XII  grossly intact. Sensation intact, DTR normal. Strength 5/5 in all 4.  Psychiatric: Normal judgment and  insight. Alert and oriented x 3. Normal mood.  Skin: no rashes, lesions, ulcers. No induration     Labs on admission:    I have personally reviewed following labs and imaging studies  CBC: Recent Labs  Lab 12/21/23 1251  WBC 9.9  NEUTROABS 6.1  HGB 6.4*  HCT 22.0*  MCV 92.1  PLT 177   Basic Metabolic Panel: Recent Labs  Lab 12/16/23 1038 12/21/23 1251  NA 139 140  K 3.4* 4.4  CL 102 103  CO2 27 27  GLUCOSE 171* 110*  BUN 41* 52*  CREATININE 1.79* 1.97*  CALCIUM  7.5* 9.1   GFR: Estimated Creatinine Clearance: 28.9 mL/min (A) (by C-G formula based on SCr of 1.97 mg/dL (H)). Liver Function Tests: Recent Labs  Lab 12/21/23 1251  AST 45*  ALT 16  ALKPHOS 212*  BILITOT 1.3*  PROT 8.1  ALBUMIN 3.1*   No results for input(s): LIPASE, AMYLASE in the last 168 hours. No results for input(s): AMMONIA in the last 168 hours. Coagulation Profile: Recent Labs  Lab 12/21/23 1251  INR 1.9*   Cardiac Enzymes: No results for input(s): CKTOTAL, CKMB, CKMBINDEX, TROPONINI in the last 168 hours. BNP (last 3 results) Recent Labs    11/16/23 1201  PROBNP 14,798.0*   Urine analysis:    Component Value Date/Time   COLORURINE YELLOW 11/16/2023 1209   APPEARANCEUR CLEAR 11/16/2023 1209   LABSPEC 1.006 11/16/2023 1209   PHURINE 5.0 11/16/2023 1209   GLUCOSEU NEGATIVE 11/16/2023 1209   HGBUR SMALL (A) 11/16/2023 1209   BILIRUBINUR NEGATIVE 11/16/2023 1209   KETONESUR NEGATIVE 11/16/2023 1209   PROTEINUR 30 (A) 11/16/2023 1209   UROBILINOGEN 0.2 02/10/2014 1255   NITRITE NEGATIVE 11/16/2023 1209   LEUKOCYTESUR NEGATIVE 11/16/2023 1209    Last A1C:  Lab Results  Component Value Date   HGBA1C 6.6 (H) 11/23/2023     Radiologic Exams on Admission:   CT Head Wo Contrast Result Date: 12/21/2023 CLINICAL DATA:  Altered mental status. EXAM: CT HEAD WITHOUT CONTRAST TECHNIQUE: Contiguous axial images were obtained from the base of the skull through the  vertex without intravenous contrast. RADIATION DOSE REDUCTION: This exam was performed according to the departmental dose-optimization program which includes automated exposure control, adjustment of the mA and/or kV according to patient size and/or use of iterative reconstruction technique. COMPARISON:  09/15/2017 FINDINGS: Brain: Ventricles, cisterns and other CSF spaces are normal. There is no mass, mass effect, shift of midline structures or acute hemorrhage. There is mild chronic ischemic microvascular disease. No acute infarction. Vascular: No hyperdense vessel or unexpected calcification. Skull: Normal. Negative for fracture or focal lesion. Sinuses/Orbits: No acute finding. Other: None. IMPRESSION: 1. No acute findings. 2. Mild chronic ischemic microvascular disease. Electronically Signed   By: Toribio Agreste M.D.   On: 12/21/2023 14:07   DG Chest Port 1 View Result Date: 12/21/2023 EXAM: 1 VIEW(S) XRAY OF THE CHEST 12/21/2023 01:12:00 PM COMPARISON: 12/01/2023 CLINICAL HISTORY: Questionable sepsis - evaluate for abnormality FINDINGS: LINES, TUBES AND DEVICES: Right PICC line removed. LUNGS AND PLEURA: Vascular congestion and interstitial pulmonary edema Possible small bilateral pleural effusions. No pneumothorax. HEART AND MEDIASTINUM: Heart size at upper limits of normal but stable. BONES AND SOFT TISSUES: Right shoulder arthroplasty noted. IMPRESSION: 1. Pulmonary edema and small bilateral pleural effusions. Electronically signed by: Maude Stammer MD 12/21/2023 01:43 PM EST RP Workstation: [id number]    EKG:  Independently reviewed.  Orders placed or performed during the hospital encounter of 12/21/23   EKG 12-Lead   EKG 12-Lead   ED EKG   ED EKG   EKG 12-Lead   ---------------------------------------------------------------------------------------------------------------------------------------    Assessment / Plan:   Principal Problem:   GIB (gastrointestinal bleeding) Active  Problems:   Altered mental status, unspecified   Acute on chronic combined systolic and diastolic CHF (congestive heart failure) (HCC)   Hyperglycemia due to type 2 diabetes mellitus (HCC)   Fibromyalgia   Anemia due to chronic kidney disease   Hypertension   GERD (gastroesophageal reflux disease)   COPD (chronic obstructive pulmonary disease) (HCC)   Obesity   Hypothyroidism   Acute embolism and thrombosis of unspecified deep veins of lower extremity, bilateral (HCC)   Atrial fibrillation (HCC)   Cerebral infarction, unspecified (HCC)   Chronic kidney disease, stage 3b (HCC)   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)   Chronic respiratory failure with hypoxia (HCC)   Hyperlipidemia   Depression   Advanced hepatic fibrosis   Assessment and Plan: * GIB (gastrointestinal bleeding) - Acute GI bleed in the setting of chronic anticoagulation with Eliquis     Latest Ref Rng & Units 12/21/2023   12:51 PM 12/06/2023   12:10 AM 12/04/2023    5:00 AM  CBC  WBC 4.0 - 10.5 K/uL 9.9  10.2  10.1   Hemoglobin 12.0 - 15.0 g/dL 6.4  9.1  8.4   Hematocrit 36.0 - 46.0 % 22.0  29.4  26.1   Platelets 150 - 400 K/uL 177  124  118    - Will proceed with 2U PRBC blood transfusion -Occult positive, -Holding home dose Eliquis  -Continuing IV Protonix  40 mg twice daily -GI consulted, appreciate further evaluation recommendation  Altered mental status, unspecified Altered mental status with confusion -Multifactorial likely secondary to polypharmacy, anemia, rule out infection, rule out acute CVA,  - Will continue to monitor closely, continue with neurochecks - Moderate on current home medications,  Anemia due to chronic kidney disease Acute on chronic anemia due to chronic disease and CKD -Acute anemia due to blood loss - GI bleed -Holding home dose Eliquis  -Pursuing with 2U PRBC blood transfusion   Fibromyalgia - Reviewing home medication: Atarax , Robaxin , Lyrica ,  trazodone  Modifying meds due to altered mental status  Hyperglycemia due to type 2 diabetes mellitus (HCC) Last A1c 6.6 on 11/23/2023 -Holding home regimen, checking CBG q. ACHS, SSI coverage - Carb modified diet  Acute on chronic combined systolic and diastolic CHF (congestive heart failure) (HCC) Chronic combined systolic and diastolic heart failure- HFrEF TEE 11/17/2023 showing reduced LVEF 35-40%, regional wall motion abnormalities, mildly reduced RV function -Patient has been evaluated by heart failure team on 12/06/2023, has been diuresed with oral torsemide  on outpatient setting -In ED patient has received 60 mg of IV Lasix , will resume torsemide  orally in a.m. -Poor candidate for SGL 2 inhibitor due to frequent yeast infection, not a candidate for Entresto  due to worsening creatinine -Medications reviewed continuing bisoprolol   Depression - Reviewing current home medication, resuming accordingly, modifying due to altered mental status  Hyperlipidemia Continue statins, is on Repatha  - Holding statin, monitoring LFTs  Chronic respiratory failure with hypoxia (HCC) Continue supplemental oxygen , maintaining O2 sat greater than 92%  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) History of DVT-on Eliquis , holding for now for GI bleed  Chronic kidney disease, stage 3b (HCC) - Chronic kidney disease stage IIIb -Trend BUN/creatinine closely, avoiding nephrotoxins  BUN/creatinine at baseline Lab Results  Component Value Date   CREATININE 1.97 (H) 12/21/2023   CREATININE 1.79 (H) 12/16/2023   CREATININE 2.11 (H) 12/05/2023     Cerebral infarction, unspecified (HCC) - With minimal residual finding, once stable resuming secondary prevention including Eliquis , lipid-lowering medication including statins, Repatha ,  Atrial fibrillation (HCC) ? H/o  A-fib -no records Per electronic record electronic records, no documentation -Seems that patient is on Eliquis  for history  of DVT and prior CVA -Currently normal sinus rhythm  Acute embolism and thrombosis of unspecified deep veins of lower extremity, bilateral (HCC) - Chronically anticoagulated on Eliquis , on hold now due to GI bleed No signs of edema, erythema or pain in lower extremities  Hypothyroidism Continue home dose Synthroid   Obesity Body mass index is 28.16 kg/m. Stable weight  COPD (chronic obstructive pulmonary disease) (HCC) - Currently stable, continue to monitor, no signs of exacerbation -Continue inhalers, DuoNeb bronchodilators  GERD (gastroesophageal reflux disease) Hold p.o. PPI, initiating IV Protonix  twice daily 40 mg  Hypertension Holding Aldactone, continue torsemide , -Trend BP closely  Advanced hepatic fibrosis Chronic hepatic fibrosis With chronic mildly abnormal LFTs, monitor closely   Addendum:  Elevated troponin Denies any chest pain, no change in EKG Likely ischemic demand in the face of symptomatic anemia With significant underlying heart failure-recent evaluation by heart failure team at [facility]  Will consult cardiology Patient is not a candidate for any intervention due to her age, multiple comorbidities     Consults called: Gastroenterologist /palliative care team/TOC/PT -------------------------------------------------------------------------------------------------------------------------------------------- DVT prophylaxis:  Place and maintain sequential compression device Start: 12/21/23 1454 SCDs Start: 12/21/23 1452 Holding home medication of Eliquis   Code Status:   Code Status: Full Code Palliative care will be consulted to further discuss goals of care and CODE STATUS   Admission status: Patient will be admitted as Observation, with a greater than 2 midnight length of stay. Level of care: Telemetry   Family Communication: Discussed with her daughter Dacie Mandel  (The above findings and plan of care has been discussed with patient in detail,  the patient expressed understanding and agreement of above plan)  --------------------------------------------------------------------------------------------------------------------------------------------------  Disposition Plan:  Anticipated 1-2 days Status is: Observation The patient remains OBS appropriate and will d/c before 2 midnights.     ----------------------------------------------------------------------------------------------------------------------------------------------------  Time spent:  25  Min.  Was spent seeing and evaluating the patient, reviewing all medical records, drawn plan of care.  SIGNED: Adriana DELENA Grams, MD, FHM. FAAFP.  - [facility], Pager  (Please use [email or web address] to page/ or secure chat through epic) If 7PM-7AM, please contact night-coverage [email or web address],  12/21/2023, 3:51 PM

## 2023-12-21 NOTE — Assessment & Plan Note (Signed)
 Body mass index is 28.16 kg/m. Stable weight

## 2023-12-21 NOTE — Assessment & Plan Note (Addendum)
-   Reviewing home medication: Atarax , Robaxin , Lyrica , trazodone  Modifying meds due to altered mental status

## 2023-12-21 NOTE — ED Provider Notes (Signed)
 [place] EMERGENCY DEPARTMENT AT [facility] Provider Note   CSN: [id number] Arrival date & time: 12/21/23  1220     Patient presents with: Altered Mental Status   Stephanie ZOBRIST is a 73 y.o. female.   HPI Fever this am (99.7). Less responsive. Alert/oriented this AM, then getting more lethargic throughout morning, not answering questions. Earlier this AM said she wasn't feeling good. Normally alert/oriented, bathes self. Got 2 potassium pills due to concern hypokalemic (due to recent admission). Unclear last known well.  Prior to Admission medications   Medication Sig Start Date End Date Taking? Authorizing Provider  Accu-Chek FastClix Lancets MISC Use 1 lancet to check blood glucose four (4) times daily as directed by provider. 09/23/23   Therisa Benton PARAS, NP  ACCU-CHEK GUIDE test strip 4 (four) times daily. 07/02/20   [provider]  albuterol  (PROVENTIL ) (2.5 MG/3ML) 0.083% nebulizer solution Take 2.5 mg by nebulization every 6 (six) hours as needed for wheezing or shortness of breath.    [provider]  albuterol  (VENTOLIN  HFA) 108 (90 Base) MCG/ACT inhaler Inhale 1-2 puffs into the lungs every 6 (six) hours as needed for wheezing or shortness of breath. 03/21/21   Stuart Vernell Norris, PA-C  Alcohol Swabs (ALCOHOL PADS) 70 % PADS SMARTSIG:Pledget(s) Topical 4 Times Daily 08/12/20   [provider]  apixaban  (ELIQUIS ) 5 MG TABS tablet Take 1 tablet (5 mg total) by mouth 2 (two) times daily. 05/21/21   Arrien, Elidia Sieving, MD  Ascorbic Acid  (VITAMIN C ) 1000 MG tablet Take 500 mg by mouth daily.    [provider]  atorvastatin  (LIPITOR ) 80 MG tablet Take 1 tablet (80 mg total) by mouth daily. 05/22/21 06/24/58  Arrien, Elidia Sieving, MD  Blood Glucose Calibration (ACCU-CHEK GUIDE CONTROL) LIQD See admin instructions. 07/02/20   [provider]  Blood Glucose Monitoring Suppl (ACCU-CHEK GUIDE ME) w/Device KIT Use Glucose Meter to  monitor blood glucose readings four(4) times daily as directed by provider. 09/23/23   Therisa Benton PARAS, NP  Cetirizine  HCl 10 MG CAPS Take 10 mg by mouth daily.    [provider]  Cholecalciferol  (VITAMIN D ) 2000 units tablet Take 4,000 Units by mouth daily.  Patient taking differently: Take 2,000 Units by mouth daily.    [provider]  cimetidine  (TAGAMET ) 200 MG tablet Take 0.5 tablets (100 mg total) by mouth daily as needed. 03/22/20   Ricky Fines, MD  CINNAMON PO Take 1-2 capsules by mouth 3 (three) times daily.    [provider]  clobetasol  ointment (TEMOVATE ) 0.05 % Apply 1 application  topically as needed (imflammation). 06/20/15   [provider]  Continuous Glucose Receiver (FREESTYLE LIBRE 3 READER) DEVI USE TO MONITOR GLUCOSE CONTINUOUSLY AS DIRECTED 06/15/23   Therisa Benton PARAS, NP  Continuous Glucose Sensor (FREESTYLE LIBRE 3 PLUS SENSOR) MISC USE TO MONITOR GLUCOSE CONTINUOUSLY AS DIRECTED. CHANGE SENSOR EVERY 15 DAYS. 06/17/23   Therisa Benton PARAS, NP  DULoxetine  (CYMBALTA ) 60 MG capsule Take 60 mg by mouth 2 (two) times daily.    [provider]  EASY COMFORT PEN NEEDLES 31G X 5 MM MISC INJECT IN SULIN EVERY DAY AS DIRECTED 06/02/21   [provider]  Evolocumab  (REPATHA  SURECLICK) 140 MG/ML SOAJ Inject 140 mg into the skin every 14 (fourteen) days. 03/18/23   Whitfield Raisin, NP  fluticasone  (FLONASE ) 50 MCG/ACT nasal spray SMARTSIG:2 Spray(s) Both Nares Daily PRN 04/05/23   [provider]  glucose blood (ACCU-CHEK  GUIDE TEST) test strip Use 1 test strip four(4) times daily to monitor blood glucose as directed by provider. 09/23/23   Therisa Benton PARAS, NP  guaiFENesin  (ROBITUSSIN) 100 MG/5ML SOLN Take 5 mLs (100 mg total) by mouth every 4 (four) hours as needed for cough or to loosen phlegm. Patient taking differently: Take 5 mLs by mouth as needed for cough or to loosen phlegm. 03/17/20   Maree, Pratik D, DO  hydrOXYzine   (ATARAX ) 25 MG tablet Take 1 tablet (25 mg total) by mouth every 8 (eight) hours as needed for itching. 12/06/23   Arrien, Elidia Sieving, MD  insulin  degludec (TRESIBA  FLEXTOUCH) 100 UNIT/ML FlexTouch Pen Inject 15 Units into the skin at bedtime. 06/14/23   Therisa Benton PARAS, NP  KLAYESTA  powder APPLY TOPICALLY THREE TIMES DAILY 09/20/23   Therisa Benton PARAS, NP  Lancet Devices (EASY MINI EJECT LANCING DEVICE) MISC 4 (four) times daily. 07/02/20   [provider]  levothyroxine  (SYNTHROID ) 100 MCG tablet Take 1 tablet (100 mcg total) by mouth daily before breakfast. 10/21/23   Therisa Benton PARAS, NP  lidocaine  (LIDODERM ) 5 % Place 1 patch onto the skin daily. Remove & Discard patch within 12 hours or as directed by MD 01/29/22   Leath-Warren, Etta PARAS, NP  lubiprostone  (AMITIZA ) 24 MCG capsule Take 1 capsule (24 mcg total) by mouth 2 (two) times daily with a meal. 01/21/22   Ezzard Sonny RAMAN, PA-C  methocarbamol  (ROBAXIN ) 500 MG tablet Take 1 tablet (500 mg total) by mouth 3 (three) times daily as needed. 08/06/17   Kay Kemps, MD  pantoprazole  (PROTONIX ) 40 MG tablet Take 1 tablet (40 mg total) by mouth daily before breakfast. 01/21/22   Ezzard Sonny RAMAN, PA-C  potassium chloride  (KLOR-CON  M) 10 MEQ tablet Take 1 tablet (10 mEq total) by mouth daily. 12/16/23 03/15/24  Hayes Beckey CROME, NP  pregabalin  (LYRICA ) 75 MG capsule Take 1 capsule (75 mg total) by mouth daily. 12/07/23   Arrien, Elidia Sieving, MD  RESTASIS 0.05 % ophthalmic emulsion Place 1 drop into both eyes 2 (two) times daily. 08/21/20   [provider]  Simethicone 125 MG CAPS Take 1 capsule every day by oral route as needed.    [provider]  Skin Protectants, Misc. (INTERDRY 89K855) SHEE Change pad daily or if soiled 10/21/23   Therisa Benton PARAS, NP  spironolactone (ALDACTONE) 25 MG tablet Take 25 mg by mouth daily.    [provider]  Tiotropium Bromide-Olodaterol (STIOLTO RESPIMAT ) 2.5-2.5 MCG/ACT AERS  Inhale 2 puffs into the lungs daily. 03/19/23   Wert, Michael B, MD  tirzepatide  (MOUNJARO ) 12.5 MG/0.5ML Pen Inject 12.5 mg into the skin once a week. 06/14/23   Therisa Benton PARAS, NP  torsemide  60 MG TABS Take 60 mg by mouth 2 (two) times daily. 12/06/23   Arrien, Mauricio Daniel, MD  traMADol  (ULTRAM ) 50 MG tablet Take 1 tablet (50 mg total) by mouth every 8 (eight) hours as needed for severe pain (pain score 7-10). 12/06/23   Arrien, Mauricio Daniel, MD  traZODone  (DESYREL ) 150 MG tablet Take 150 mg by mouth at bedtime. 04/07/22   [provider]  ursodiol (ACTIGALL) 300 MG capsule Take 300 mg by mouth 2 (two) times daily. Patient taking differently: Take 600 mg by mouth 2 (two) times daily.    [provider]    Allergies: Tetracyclines & related, Banana, Jardiance  [empagliflozin ], and Penicillins    Review of Systems  Unable to perform ROS: Mental status change  Updated Vital Signs BP 119/70   Pulse (!) 104   Temp 99.6 F (37.6 C) (Rectal)   Resp (!) 22   Ht 5' 8 (1.727 m)   Wt 84 kg   SpO2 100%   BMI 28.16 kg/m   Physical Exam Vitals and nursing note reviewed. Exam conducted with a chaperone present.  Constitutional:      Appearance: She is well-developed.     Comments: She is sleeping but easily awakens to voice. Speaks softly.   HENT:     Head: Normocephalic and atraumatic.  Eyes:     Pupils: Pupils are equal, round, and reactive to light.  Cardiovascular:     Rate and Rhythm: Regular rhythm. Tachycardia present.     Heart sounds: Normal heart sounds.  Pulmonary:     Effort: Pulmonary effort is normal.     Breath sounds: Normal breath sounds. No wheezing.  Abdominal:     General: There is no distension.     Palpations: Abdomen is soft.     Tenderness: There is no abdominal tenderness.  Genitourinary:    Comments: Dark brown stool on rectal exam Musculoskeletal:     Cervical back: Neck supple. No rigidity.  Skin:    General: Skin is warm  and dry.  Neurological:     Comments: No facial droop. Has weak but symmetric movement in all 4 extremities. However, limited exam based on patient effort.     (all labs ordered are listed, but only abnormal results are displayed) Labs Reviewed  COMPREHENSIVE METABOLIC PANEL WITH GFR - Abnormal; Notable for the following components:      Result Value   Glucose, Bld 110 (*)    BUN 52 (*)    Creatinine, Ser 1.97 (*)    Albumin 3.1 (*)    AST 45 (*)    Alkaline Phosphatase 212 (*)    Total Bilirubin 1.3 (*)    GFR, Estimated 26 (*)    All other components within normal limits  CBC WITH DIFFERENTIAL/PLATELET - Abnormal; Notable for the following components:   RBC 2.39 (*)    Hemoglobin 6.4 (*)    HCT 22.0 (*)    MCHC 29.1 (*)    RDW 17.3 (*)    Monocytes Absolute 1.1 (*)    All other components within normal limits  PROTIME-INR - Abnormal; Notable for the following components:   Prothrombin Time 22.6 (*)    INR 1.9 (*)    All other components within normal limits  BLOOD GAS, VENOUS - Abnormal; Notable for the following components:   pH, Ven 7.44 (*)    pCO2, Ven 42 (*)    Bicarbonate 28.5 (*)    Acid-Base Excess 4.1 (*)    All other components within normal limits  POC OCCULT BLOOD, ED - Abnormal; Notable for the following components:   Fecal Occult Bld POSITIVE (*)    All other components within normal limits  RESP PANEL BY RT-PCR (RSV, FLU A&B, COVID)  RVPGX2  CULTURE, BLOOD (ROUTINE X 2)  CULTURE, BLOOD (ROUTINE X 2)  EXPECTORATED SPUTUM ASSESSMENT W GRAM STAIN, RFLX TO RESP C  LACTIC ACID, PLASMA  URINALYSIS, W/ REFLEX TO CULTURE (INFECTION SUSPECTED)  PRO BRAIN NATRIURETIC PEPTIDE  VITAMIN B12  FOLATE  IRON  AND TIBC  FERRITIN  RETICULOCYTES  CBC  MAGNESIUM   PHOSPHORUS  PROTIME-INR  TYPE AND SCREEN  ABO/RH  PREPARE RBC (CROSSMATCH)  TROPONIN T, HIGH SENSITIVITY    EKG: None  Radiology: CT Head Wo  Contrast Result Date: 12/21/2023 CLINICAL DATA:   Altered mental status. EXAM: CT HEAD WITHOUT CONTRAST TECHNIQUE: Contiguous axial images were obtained from the base of the skull through the vertex without intravenous contrast. RADIATION DOSE REDUCTION: This exam was performed according to the departmental dose-optimization program which includes automated exposure control, adjustment of the mA and/or kV according to patient size and/or use of iterative reconstruction technique. COMPARISON:  09/15/2017 FINDINGS: Brain: Ventricles, cisterns and other CSF spaces are normal. There is no mass, mass effect, shift of midline structures or acute hemorrhage. There is mild chronic ischemic microvascular disease. No acute infarction. Vascular: No hyperdense vessel or unexpected calcification. Skull: Normal. Negative for fracture or focal lesion. Sinuses/Orbits: No acute finding. Other: None. IMPRESSION: 1. No acute findings. 2. Mild chronic ischemic microvascular disease. Electronically Signed   By: Toribio Agreste M.D.   On: 12/21/2023 14:07   DG Chest Port 1 View Result Date: 12/21/2023 EXAM: 1 VIEW(S) XRAY OF THE CHEST 12/21/2023 01:12:00 PM COMPARISON: 12/01/2023 CLINICAL HISTORY: Questionable sepsis - evaluate for abnormality FINDINGS: LINES, TUBES AND DEVICES: Right PICC line removed. LUNGS AND PLEURA: Vascular congestion and interstitial pulmonary edema Possible small bilateral pleural effusions. No pneumothorax. HEART AND MEDIASTINUM: Heart size at upper limits of normal but stable. BONES AND SOFT TISSUES: Right shoulder arthroplasty noted. IMPRESSION: 1. Pulmonary edema and small bilateral pleural effusions. Electronically signed by: Maude Stammer MD 12/21/2023 01:43 PM EST RP Workstation: [id number]     .Critical Care  Performed by: Freddi Hamilton, MD Authorized by: Freddi Hamilton, MD   Critical care provider statement:    Critical care time (minutes):  30   Critical care time was exclusive of:  Separately billable procedures and treating other  patients   Critical care was necessary to treat or prevent imminent or life-threatening deterioration of the following conditions:  Circulatory failure   Critical care was time spent personally by me on the following activities:  Development of treatment plan with patient or surrogate, discussions with consultants, evaluation of patient's response to treatment, examination of patient, ordering and review of laboratory studies, ordering and review of radiographic studies, ordering and performing treatments and interventions, pulse oximetry, re-evaluation of patient's condition and review of old charts    Medications Ordered in the ED  0.9 %  sodium chloride  infusion (Manually program via Guardrails IV Fluids) (has no administration in time range)  furosemide  (LASIX ) injection 60 mg (has no administration in time range)  sodium chloride  flush (NS) 0.9 % injection 3 mL (has no administration in time range)  sodium chloride  flush (NS) 0.9 % injection 3 mL (has no administration in time range)  acetaminophen  (TYLENOL ) tablet 650 mg (has no administration in time range)    Or  acetaminophen  (TYLENOL ) suppository 650 mg (has no administration in time range)  oxyCODONE  (Oxy IR/ROXICODONE ) immediate release tablet 5 mg (has no administration in time range)  HYDROmorphone  (DILAUDID ) injection 0.5-1 mg (has no administration in time range)  traZODone  (DESYREL ) tablet 25 mg (has no administration in time range)  bisacodyl  (DULCOLAX) EC tablet 5 mg (has no administration in time range)  sodium phosphate  (FLEET) enema 1 enema (has no administration in time range)  ondansetron  (ZOFRAN ) tablet 4 mg (has no administration in time range)    Or  ondansetron  (ZOFRAN ) injection 4 mg (has no administration in time range)  ipratropium (ATROVENT) nebulizer solution 0.5 mg (has no administration in time range)  hydrALAZINE  (APRESOLINE ) injection 10 mg (has no administration in time range)  senna-docusate (  Senokot-S)  tablet 1 tablet (has no administration in time range)                                    Medical Decision Making Amount and/or Complexity of Data Reviewed Independent Historian:     Details: Daughter Labs: ordered.    Details: Hemoglobin 6.4 Radiology: ordered and independent interpretation performed.    Details: CHF.  No head bleed ECG/medicine tests: ordered and independent interpretation performed.    Details: Sinus tachycardia  Risk Prescription drug management. Decision regarding hospitalization.   Patient presents with change in mental status.  Is awake and alert but sleepy.  CT head is unremarkable.  Does appear to have some CHF on chest x-ray and this appears to be more of a chronic problem.  She is not requiring more than her typical O2 requirements at baseline.  Otherwise, she is found to have acute on chronic anemia.  Will be started a unit of blood after consent with patient's daughter at the bedside.  Urine is currently pending as there was difficulty getting a specimen from a catheter.  Will place a Foley given this difficulty as well as she is going to get diuresis.  She does appear to have some CHF so I have ordered Lasix  before her blood transfusion.  Discussed with Dr. Willette for admission.  I have also consulted GI, Dr. Cinderella.     Final diagnoses:  Symptomatic anemia    ED Discharge Orders     None          Freddi Hamilton, MD 12/21/23 1524

## 2023-12-21 NOTE — Assessment & Plan Note (Addendum)
?   H/o  A-fib -no records Per electronic record electronic records, no documentation -Seems that patient is on Eliquis  for history of DVT and prior CVA -Currently normal sinus rhythm

## 2023-12-21 NOTE — Assessment & Plan Note (Signed)
 History of DVT-on Eliquis , holding for now for GI bleed

## 2023-12-21 NOTE — Consult Note (Signed)
 Telephone consultation with Dr Twana:   73 year old woman with chronic biventricular/ RV predominant heart failure , Hypertension, OSA, CKD3b, hx of DVT on Eliquis .   Called via carelink for Dr. Twana to chart review and help in clinical decision making for use of eliquis  in the setting of her symptomatic anemia ( hemoglobin 6.4) suspected to be due to GI bleeding.   She is on Eliquis  for hx of DVT - no reported atrial fibrillation on cardiac thrombus noted.   In the setting of the current clinical state based on the chart reviewed and noted symptomatic anemia - the risk of using the Eliquis  at this time outweighs the benefit.  Will defer to the care team on when to restart the eliquis  post EGD.

## 2023-12-21 NOTE — ED Triage Notes (Signed)
 Pt bib [facility] from [facility]. Pt sent for lethargy and fever. EMS unable to obtain temp but pt met EMS protocol so EMS gave 1g rocephin .

## 2023-12-21 NOTE — Assessment & Plan Note (Signed)
 -  Continue home dose Synthroid

## 2023-12-21 NOTE — Hospital Course (Signed)
 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 Presenting from [facility] for chief complaint of altered mental status with confusion..  Patient is awake but poor historian per record-at the facility patient was found more lethargic this morning stating she was not feeling good, normally she is awake alert. No reported fever chills nausea vomiting, or active bleeding.    ED Evaluation: Blood pressure 123/75, pulse (!) 104, temperature 98.9 F (37.2 C), temperature source Axillary, resp. rate (!) 21, height 5' 8 (1.727 m), weight 84 kg, SpO2 100%. Labs: CMP within normal exception of glucose 110, BUN 52, creatinine 1.97, alk phos 212, albumin 2.1, AST 45, T. bili 1.3, GFR 26, BNP 1088.4, lactic acid 1.9,  CBC: Hemoglobin of 6.4.  Platelets 117 Respiratory viral panel negative Hemoccult positive  CTA head chronic microvascular disease otherwise within normal limits, chest x-ray clear   EDP consulted gastroenterologist, ordered 2 units of PRBC to be transfused, 60 mg of IV Lasix  was given.  Requested patient to be admitted for further evaluation.

## 2023-12-22 ENCOUNTER — Observation Stay (HOSPITAL_COMMUNITY)

## 2023-12-22 DIAGNOSIS — J9611 Chronic respiratory failure with hypoxia: Secondary | ICD-10-CM | POA: Diagnosis present

## 2023-12-22 DIAGNOSIS — I5020 Unspecified systolic (congestive) heart failure: Secondary | ICD-10-CM

## 2023-12-22 DIAGNOSIS — F32A Depression, unspecified: Secondary | ICD-10-CM | POA: Diagnosis present

## 2023-12-22 DIAGNOSIS — Z794 Long term (current) use of insulin: Secondary | ICD-10-CM | POA: Diagnosis not present

## 2023-12-22 DIAGNOSIS — I5043 Acute on chronic combined systolic (congestive) and diastolic (congestive) heart failure: Secondary | ICD-10-CM | POA: Diagnosis present

## 2023-12-22 DIAGNOSIS — D6832 Hemorrhagic disorder due to extrinsic circulating anticoagulants: Secondary | ICD-10-CM | POA: Diagnosis present

## 2023-12-22 DIAGNOSIS — K7682 Hepatic encephalopathy: Secondary | ICD-10-CM | POA: Diagnosis present

## 2023-12-22 DIAGNOSIS — K922 Gastrointestinal hemorrhage, unspecified: Secondary | ICD-10-CM | POA: Diagnosis present

## 2023-12-22 DIAGNOSIS — D631 Anemia in chronic kidney disease: Secondary | ICD-10-CM | POA: Diagnosis present

## 2023-12-22 DIAGNOSIS — N1832 Chronic kidney disease, stage 3b: Secondary | ICD-10-CM | POA: Diagnosis present

## 2023-12-22 DIAGNOSIS — I5042 Chronic combined systolic (congestive) and diastolic (congestive) heart failure: Secondary | ICD-10-CM | POA: Diagnosis not present

## 2023-12-22 DIAGNOSIS — I132 Hypertensive heart and chronic kidney disease with heart failure and with stage 5 chronic kidney disease, or end stage renal disease: Secondary | ICD-10-CM | POA: Diagnosis present

## 2023-12-22 DIAGNOSIS — Z86718 Personal history of other venous thrombosis and embolism: Secondary | ICD-10-CM | POA: Diagnosis not present

## 2023-12-22 DIAGNOSIS — E1165 Type 2 diabetes mellitus with hyperglycemia: Secondary | ICD-10-CM | POA: Diagnosis present

## 2023-12-22 DIAGNOSIS — J4489 Other specified chronic obstructive pulmonary disease: Secondary | ICD-10-CM | POA: Diagnosis present

## 2023-12-22 DIAGNOSIS — R0789 Other chest pain: Secondary | ICD-10-CM | POA: Diagnosis not present

## 2023-12-22 DIAGNOSIS — R195 Other fecal abnormalities: Secondary | ICD-10-CM | POA: Diagnosis not present

## 2023-12-22 DIAGNOSIS — I130 Hypertensive heart and chronic kidney disease with heart failure and stage 1 through stage 4 chronic kidney disease, or unspecified chronic kidney disease: Secondary | ICD-10-CM | POA: Diagnosis present

## 2023-12-22 DIAGNOSIS — K7402 Hepatic fibrosis, advanced fibrosis: Secondary | ICD-10-CM | POA: Diagnosis not present

## 2023-12-22 DIAGNOSIS — I5084 End stage heart failure: Secondary | ICD-10-CM | POA: Diagnosis present

## 2023-12-22 DIAGNOSIS — R410 Disorientation, unspecified: Secondary | ICD-10-CM | POA: Diagnosis not present

## 2023-12-22 DIAGNOSIS — K219 Gastro-esophageal reflux disease without esophagitis: Secondary | ICD-10-CM | POA: Diagnosis present

## 2023-12-22 DIAGNOSIS — E669 Obesity, unspecified: Secondary | ICD-10-CM | POA: Diagnosis present

## 2023-12-22 DIAGNOSIS — E1122 Type 2 diabetes mellitus with diabetic chronic kidney disease: Secondary | ICD-10-CM | POA: Diagnosis present

## 2023-12-22 DIAGNOSIS — E039 Hypothyroidism, unspecified: Secondary | ICD-10-CM

## 2023-12-22 DIAGNOSIS — N179 Acute kidney failure, unspecified: Secondary | ICD-10-CM | POA: Diagnosis present

## 2023-12-22 DIAGNOSIS — Z1152 Encounter for screening for COVID-19: Secondary | ICD-10-CM | POA: Diagnosis not present

## 2023-12-22 DIAGNOSIS — D472 Monoclonal gammopathy: Secondary | ICD-10-CM | POA: Diagnosis present

## 2023-12-22 DIAGNOSIS — I4891 Unspecified atrial fibrillation: Secondary | ICD-10-CM | POA: Diagnosis not present

## 2023-12-22 DIAGNOSIS — K2961 Other gastritis with bleeding: Secondary | ICD-10-CM | POA: Diagnosis not present

## 2023-12-22 DIAGNOSIS — R4182 Altered mental status, unspecified: Secondary | ICD-10-CM | POA: Diagnosis not present

## 2023-12-22 DIAGNOSIS — K921 Melena: Secondary | ICD-10-CM | POA: Diagnosis not present

## 2023-12-22 DIAGNOSIS — Z7901 Long term (current) use of anticoagulants: Secondary | ICD-10-CM | POA: Diagnosis not present

## 2023-12-22 DIAGNOSIS — I69351 Hemiplegia and hemiparesis following cerebral infarction affecting right dominant side: Secondary | ICD-10-CM | POA: Diagnosis not present

## 2023-12-22 DIAGNOSIS — D649 Anemia, unspecified: Secondary | ICD-10-CM | POA: Diagnosis not present

## 2023-12-22 DIAGNOSIS — D62 Acute posthemorrhagic anemia: Secondary | ICD-10-CM | POA: Diagnosis present

## 2023-12-22 DIAGNOSIS — R7989 Other specified abnormal findings of blood chemistry: Secondary | ICD-10-CM | POA: Diagnosis not present

## 2023-12-22 LAB — BASIC METABOLIC PANEL WITH GFR
Anion gap: 13 (ref 5–15)
BUN: 54 mg/dL — ABNORMAL HIGH (ref 8–23)
CO2: 26 mmol/L (ref 22–32)
Calcium: 9.5 mg/dL (ref 8.9–10.3)
Chloride: 105 mmol/L (ref 98–111)
Creatinine, Ser: 1.79 mg/dL — ABNORMAL HIGH (ref 0.44–1.00)
GFR, Estimated: 29 mL/min — ABNORMAL LOW (ref >60)
Glucose, Bld: 116 mg/dL — ABNORMAL HIGH (ref 70–99)
Potassium: 4.4 mmol/L (ref 3.5–5.1)
Sodium: 144 mmol/L (ref 135–145)

## 2023-12-22 LAB — TYPE AND SCREEN
ABO/RH(D): O POS
Antibody Screen: NEGATIVE
Unit division: 0
Unit division: 0

## 2023-12-22 LAB — BPAM RBC
Blood Product Expiration Date: 202511252359
Blood Product Expiration Date: 202511252359
ISSUE DATE / TIME: 202511041604
ISSUE DATE / TIME: 202511041955
Unit Type and Rh: 5100
Unit Type and Rh: 5100

## 2023-12-22 LAB — CBC
HCT: 30.7 % — ABNORMAL LOW (ref 36.0–46.0)
Hemoglobin: 9.9 g/dL — ABNORMAL LOW (ref 12.0–15.0)
MCH: 28.4 pg (ref 26.0–34.0)
MCHC: 32.2 g/dL (ref 30.0–36.0)
MCV: 88 fL (ref 80.0–100.0)
Platelets: 169 K/uL (ref 150–400)
RBC: 3.49 MIL/uL — ABNORMAL LOW (ref 3.87–5.11)
RDW: 16.1 % — ABNORMAL HIGH (ref 11.5–15.5)
WBC: 11.3 K/uL — ABNORMAL HIGH (ref 4.0–10.5)
nRBC: 0.2 % (ref 0.0–0.2)

## 2023-12-22 LAB — GLUCOSE, CAPILLARY
Glucose-Capillary: 103 mg/dL — ABNORMAL HIGH (ref 70–99)
Glucose-Capillary: 123 mg/dL — ABNORMAL HIGH (ref 70–99)
Glucose-Capillary: 145 mg/dL — ABNORMAL HIGH (ref 70–99)

## 2023-12-22 LAB — TROPONIN T, HIGH SENSITIVITY: Troponin T High Sensitivity: 225 ng/L (ref 0–19)

## 2023-12-22 LAB — APTT: aPTT: 40 s — ABNORMAL HIGH (ref 24–36)

## 2023-12-22 LAB — PRO BRAIN NATRIURETIC PEPTIDE: Pro Brain Natriuretic Peptide: 14392 pg/mL — ABNORMAL HIGH (ref <300.0)

## 2023-12-22 MED ORDER — LACTULOSE 10 GM/15ML PO SOLN
20.0000 g | Freq: Three times a day (TID) | ORAL | Status: DC
  Administered 2023-12-22 – 2023-12-28 (×9): 20 g via ORAL
  Filled 2023-12-22 (×16): qty 30

## 2023-12-22 MED ORDER — CHLORHEXIDINE GLUCONATE CLOTH 2 % EX PADS
6.0000 | MEDICATED_PAD | Freq: Every day | CUTANEOUS | Status: DC
  Administered 2023-12-22 – 2023-12-27 (×6): 6 via TOPICAL

## 2023-12-22 MED ORDER — HYDROMORPHONE HCL 1 MG/ML IJ SOLN: 0.2500 mg | INTRAMUSCULAR | Status: DC | PRN

## 2023-12-22 MED ORDER — INSULIN ASPART 100 UNIT/ML IJ SOLN
0.0000 [IU] | Freq: Three times a day (TID) | INTRAMUSCULAR | Status: DC
  Administered 2023-12-22 – 2023-12-24 (×3): 1 [IU] via SUBCUTANEOUS
  Administered 2023-12-25: 7 [IU] via SUBCUTANEOUS
  Administered 2023-12-25: 1 [IU] via SUBCUTANEOUS
  Administered 2023-12-26: 3 [IU] via SUBCUTANEOUS
  Administered 2023-12-26: 1 [IU] via SUBCUTANEOUS
  Administered 2023-12-26: 3 [IU] via SUBCUTANEOUS
  Administered 2023-12-27 (×2): 2 [IU] via SUBCUTANEOUS
  Administered 2023-12-27 – 2023-12-28 (×2): 1 [IU] via SUBCUTANEOUS
  Administered 2023-12-28: 3 [IU] via SUBCUTANEOUS
  Filled 2023-12-22 (×13): qty 1

## 2023-12-22 MED ORDER — LACTATED RINGERS IV SOLN: INTRAVENOUS | Status: DC

## 2023-12-22 NOTE — Consult Note (Signed)
                                                                                   Consultation Note Date: 12/22/2023   Patient Name: Stephanie [name]  DOB: 10-18-1950  MRN: [id number]  Age / Sex: 73 y.o., female  PCP: [facility], [facility] Referring Physician: Vicci Afton CROME, MD  Reason for Consultation:   HPI/Patient Profile: 73 y.o. female  with past medical history of *** admitted on 12/21/2023 with ***.   Primary Decision Maker {Primary Decision Fjxzm:78612}  Discussion: ***    SUMMARY OF RECOMMENDATIONS -Plan to meet tomorrow at 10am     Code Status/Advance Care Planning:   Code Status: Full Code    Prognosis:   {Palliative Care Prognosis:23504}  Discharge Planning: {Palliative dispostion:23505}  Primary Diagnoses: Present on Admission:  GIB (gastrointestinal bleeding)  Acute on chronic combined systolic and diastolic CHF (congestive heart failure) (HCC)  Acute embolism and thrombosis of unspecified deep veins of lower extremity, bilateral (HCC)  Advanced hepatic fibrosis  Anemia due to chronic kidney disease  Atrial fibrillation (HCC)  Chronic kidney disease, stage 3b (HCC)  Cerebral infarction, unspecified (HCC)  Chronic respiratory failure with hypoxia (HCC)  COPD (chronic obstructive pulmonary disease) (HCC)  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)  Depression  Fibromyalgia  GERD (gastroesophageal reflux disease)  Hyperglycemia due to type 2 diabetes mellitus (HCC)  Hyperlipidemia  Hypertension  Hypothyroidism  Obesity  Altered mental status, unspecified   Review of Systems  Physical Exam  Vital Signs: BP 132/79   Pulse (!) 111   Temp 97.7 F (36.5 C) (Oral)   Resp 18   Ht 5' 8 (1.727 m)   Wt 83.4 kg Comment: refuse to stand up  SpO2 100%   BMI 27.96 kg/m  Pain Scale: 0-10   Pain Score: 0-No pain   SpO2: SpO2: 100 % O2 Device:SpO2: 100 % O2 Flow Rate: .O2 Flow Rate (L/min): 3 L/min  IO: Intake/output summary:   Intake/Output Summary (Last 24 hours) at 12/22/2023 1407 Last data filed at 12/22/2023 1225 Gross per 24 hour  Intake 743 ml  Output 3050 ml  Net -2307 ml    LBM: Last BM Date : 12/21/23 Baseline Weight: Weight: 84 kg Most recent weight: Weight: 83.4 kg (refuse to stand up)       Thank you for this consult. Palliative medicine will continue to follow and assist as needed.  Time Total: *** Signed by: Cassondra Stain, AGNP-C Palliative Medicine  Time includes:   Preparing to see the patient (e.g., review of tests) Obtaining and/or reviewing separately obtained history Performing a medically necessary appropriate examination and/or evaluation Counseling and educating the patient/family/caregiver Ordering medications, tests, or procedures Referring and communicating with other health care professionals (when not reported separately) Documenting clinical information in the electronic or other health record Independently interpreting results (not reported separately) and communicating results to the patient/family/caregiver Care coordination (not reported separately) Clinical documentation   Please contact Palliative Medicine Team phone at [phone] for questions and concerns.  For individual provider: See Tracey

## 2023-12-22 NOTE — Progress Notes (Signed)
 Subjective: Patient alert but confused during my encounter, can tell me her name but answers inappropriately to all other questions. She endorses some abdominal pain but cannot provide any further history on this.   Objective: Vital signs in last 24 hours: Temp:  [97.5 F (36.4 C)-99.6 F (37.6 C)] 97.7 F (36.5 C) (11/05 0106) Pulse Rate:  [99-111] 111 (11/05 0845) Resp:  [16-22] 18 (11/04 2127) BP: (106-144)/(70-87) 132/79 (11/05 0845) SpO2:  [100 %] 100 % (11/05 0106) Weight:  [83.4 kg-84 kg] 83.4 kg (11/05 0500) Last BM Date : 12/21/23 General:   Alert and oriented to person only, repetitive speech  Head:  Normocephalic and atraumatic. Eyes:  No icterus, sclera clear. Conjuctiva pink.  Mouth:  Without lesions, mucosa pink and moist.  Heart:  S1, S2 present, no murmurs noted.  Lungs: Clear to auscultation bilaterally, without wheezing, rales, or rhonchi.  Abdomen:  Bowel sounds present, soft, non-distended. Does endorse some pain with palpation to generalized abdomen, unable to provide further history. No HSM or hernias noted. No rebound or guarding. No masses appreciated  Neurologic:  Alert and oriented only to self, unable to assess asterixis due to confusion, inability to follow commands  Skin:  Warm and dry, intact without significant lesions.  Psych:  Alert, repetitive speech   Intake/Output from previous day: 11/04 0701 - 11/05 0700 In: 743 [I.V.:10; Blood:733] Out: 2150 [Urine:2150] Intake/Output this shift: No intake/output data recorded.  Lab Results: Recent Labs    12/21/23 1251 12/22/23 0423  WBC 9.9 11.3*  HGB 6.4* 9.9*  HCT 22.0* 30.7*  PLT 177 169   BMET Recent Labs    12/21/23 1251 12/22/23 0423  NA 140 144  K 4.4 4.4  CL 103 105  CO2 27 26  GLUCOSE 110* 116*  BUN 52* 54*  CREATININE 1.97* 1.79*  CALCIUM  9.1 9.5   LFT Recent Labs    12/21/23 1251  PROT 8.1  ALBUMIN 3.1*  AST 45*  ALT 16  ALKPHOS 212*  BILITOT 1.3*    PT/INR Recent Labs    12/21/23 1251  LABPROT 22.6*  INR 1.9*    Studies/Results: CT Head Wo Contrast Result Date: 12/21/2023 CLINICAL DATA:  Altered mental status. EXAM: CT HEAD WITHOUT CONTRAST TECHNIQUE: Contiguous axial images were obtained from the base of the skull through the vertex without intravenous contrast. RADIATION DOSE REDUCTION: This exam was performed according to the departmental dose-optimization program which includes automated exposure control, adjustment of the mA and/or kV according to patient size and/or use of iterative reconstruction technique. COMPARISON:  09/15/2017 FINDINGS: Brain: Ventricles, cisterns and other CSF spaces are normal. There is no mass, mass effect, shift of midline structures or acute hemorrhage. There is mild chronic ischemic microvascular disease. No acute infarction. Vascular: No hyperdense vessel or unexpected calcification. Skull: Normal. Negative for fracture or focal lesion. Sinuses/Orbits: No acute finding. Other: None. IMPRESSION: 1. No acute findings. 2. Mild chronic ischemic microvascular disease. Electronically Signed   By: Toribio Agreste M.D.   On: 12/21/2023 14:07   DG Chest Port 1 View Result Date: 12/21/2023 EXAM: 1 VIEW(S) XRAY OF THE CHEST 12/21/2023 01:12:00 PM COMPARISON: 12/01/2023 CLINICAL HISTORY: Questionable sepsis - evaluate for abnormality FINDINGS: LINES, TUBES AND DEVICES: Right PICC line removed. LUNGS AND PLEURA: Vascular congestion and interstitial pulmonary edema Possible small bilateral pleural effusions. No pneumothorax. HEART AND MEDIASTINUM: Heart size at upper limits of normal but stable. BONES AND SOFT TISSUES: Right shoulder arthroplasty noted. IMPRESSION: 1. Pulmonary edema and small  bilateral pleural effusions. 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    Acute on chronic anemia/hemoccult positive:  -Hgb 6.4 on admission, down from 9.1 on 12/06/23 -iron  38, sat 14 ferritin 458  -folate 7, B12 1086 -hgb improved to 9.9, s/p infusion of 2 Units PRBCs  -recent small volume rectal bleeding while inpatient in 11/2023, seen by GI in [place], decision made to avoid endoscopic evaluations due to high risk comorbidities  -last dose of Eliquis  Tues at 11:40am -last EGD 2021 with esophageal plaques, biopsies +for candidda, no EVs present at that time  -no reported melena or recent rectal bleeding. DRE done yesterday, with brown stool, hemoccult positive -on arrival BUN 52/Cre 1.97 with increases of BUN above baseline -notably, patient has dropped 50 pounds since her last visit with hepatology in 04/2023 (weighed 107 kg at that time). She is on tirzepatide . She has had significant decline requiring SNF placement after last hospitalization.  could have occult bleeding from anywhere within GI tract. Would be concerned for possible portal HTN gastropathy given possible cirrhosis on most recent u/s. Differential also includes gastritis, bleeding polyps, ulcers, angioectasias, and malignancy.  Ideally needs EGD/colonoscopy but she is considered high risk. Discussion held with family yesterday who wanted to hold off on any invasive procedures/testing right now and prefer more conservative management.   History of MASH/concern for cirrhosis/possible AMA negative PBC: -followed closely by [facility] -patient on ursodiol 600mg  BID -discordant liver imaging/workup -US  08/2023 concerning for cirrhosis with slightly lobulated liver border, spleen normal in  size.  -FibroScan in June 2024 with a liver stiffness of 11.7 kPa consistent with F2-F3 fibrosis  -MRCP 08/2022 with no significant liver changes -liver biopsy in 2022 with steatohepatitis grade 1/3, mild portal and centrilobular fibrosis stage 1/4, imaging at that time also with nodular hepatic contours and coarsened parenchyma of the liver -ammonia level was checked yesterday due to lethargy, ammonia 105, notably has been mildly elevated in the past (dating back atleast 6 years ago)  -labs yesterday with AST 45, ALT 16, AP 212, T bili 1.3 plt count 169k  Patient is alert but confused during my encounter, ammonia elevated at 105, per nursing staff, patient able to tolerate pills with applesauce this morning. recommend starting lactulose 20g TID, titrate for 2-3 soft BMs per day  Solid food dysphagia/GERD: -complains of heartburn, solid food dysphagia, present during last admission -pantoprazole  increased yesterday to 40mg  BID   Symptoms could be secondary to esophagitis, stricture, motility disorder, less likely malignancy. As above, due to high risk multimorbidities, holding off on endoscopic evaluations at this time, may benefit from SLP evaluation.     Plan: PPI BID Hold eliquis  for now Transfuse as needed for hgb 7 or less Trend h&h, monitor for overt GI bleeding Start lactulose 20g TID, titrate for 2-3 soft stools per day Monitor clinical course of mental status, no need to repeat ammonia level    LOS: 0 days    12/22/2023, 9:01 AM  Keandrea Tapley L. Oland Arquette, MSN, APRN, AGNP-C Adult-Gerontology Nurse Practitioner [facility] at [facility]

## 2023-12-22 NOTE — Evaluation (Addendum)
 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 Presenting from [facility] for chief complaint of altered mental status with confusion..   Patient is awake but poor historian per record-at the facility patient was found more lethargic this morning stating she was not feeling good, normally she is awake alert.  No reported fever chills nausea vomiting, or active bleeding.   Clinical Impression  Patient appeared agreeable to OT/PT co-evaluation. Patient's AMS limited her verbal responses and command following this date. Patient's daughter was present at arrival for evaluation but left quickly thereafter. Unable to confirm patient's PLOF and cognition at baseline. On this date, patient requires max/total assist for bed mobility, and functional transfers +2. Mild jerky/ataxic movements of note during supine to sit. Demonstrates poor seated balance overall. While seated EOB, pt seems to neglect the R side with her head turned and eye gaze to R. No grip in both hands, unsure if due to altered cognition or other underlying cause. Little use of BUE in sitting for balance. 2+ for stand from elevated height, pt unable to grip RW to assist. Once back supine in bed, patient does demonstrate some active movement of all extremities but eye gaze remains to L side of room. Bed alarm set and call button within reach. Patient's care team made aware of all the above. Patient will benefit from continued skilled physical therapy acutely and in recommended venue in order to address current deficits and improve overall function.        If plan is discharge home, recommend the following: A lot of help  with walking and/or transfers;A lot of help with bathing/dressing/bathroom;Assistance with cooking/housework;Assist for transportation;Help with stairs or ramp for entrance;Supervision due to cognitive status   Can travel by private vehicle   No    Equipment Recommendations None recommended by PT  Recommendations for Other Services       Functional Status Assessment Patient has had a recent decline in their functional status and demonstrates the ability to make significant improvements in function in a reasonable and predictable amount of time.     Precautions / Restrictions Precautions Precautions: Fall Recall of Precautions/Restrictions: Impaired Restrictions Weight Bearing Restrictions Per Provider Order: No      Mobility  Bed Mobility Overal bed mobility: Needs Assistance Bed Mobility: Supine to Sit, Sit to Supine     Supine to sit: Max assist, Total assist Sit to supine: Max assist, Total assist   General bed mobility comments: max/total assist for LE and trunk handling.    Transfers Overall transfer level: Needs assistance Equipment used: None Transfers: Sit to/from Stand Sit to Stand: Max assist, Total assist           General transfer comment: Max/total assist x2 for stand from bed, 3 attempts total with first 2 unsuccessful, 3rd attempt with elevated bed height with pt demonstrating no active assist, trunk remains flexed, hips/knees flexed    Ambulation/Gait               General Gait Details: Unsafe to test this date  Stairs            Wheelchair Mobility     Tilt Bed    Modified Rankin (Stroke Patients Only)  Balance Overall balance assessment: Needs assistance Sitting-balance support: Feet supported, Bilateral upper extremity supported Sitting balance-Leahy Scale: Poor Sitting balance - Comments: pt able to maintain seated balance for ~10-15 secs max before requiring external support seated EOB Postural control: Left  lateral lean, Posterior lean Standing balance support: During functional activity, Bilateral upper extremity supported, No upper extremity supported Standing balance-Leahy Scale: Zero Standing balance comment: during standing trial         Pertinent Vitals/Pain Pain Assessment Pain Assessment: Faces Faces Pain Scale: Hurts little more Pain Descriptors / Indicators: Grimacing, Moaning Pain Intervention(s): Limited activity within patient's tolerance, Repositioned    Home Living Family/patient expects to be discharged to:: Skilled nursing facility       Additional Comments: Pt arrives to hospital from [facility] due to AMS, been at [facility] for 15 days. Previously lived with daughter.    Prior Function Prior Level of Function : Needs assist     Mobility Comments: Unsure of level of fucntion while at [facility]. Per previous notes, prior to SNF placement, pt ambulated with [facility] mostly with occassional use of RW, history of falls ADLs Comments: Unsure of level of fucntion while at [facility]. Per previous notes, prior to SNF placement, pt was Ind with grooming, toileting, UB ADLs; daughter assists with LB ADLs and sup for showers     Extremity/Trunk Assessment   Upper Extremity Assessment Upper Extremity Assessment: Defer to OT evaluation    Lower Extremity Assessment Lower Extremity Assessment: Difficult to assess due to impaired cognition;Generalized weakness (Difficult to assess secondary to altered cognition. Pt appears generally weak. She does not assist with transfers or standing attempts. However, once back in bed, pt demonstrates ability to actively move BLE to EOB.)    Cervical / Trunk Assessment Cervical / Trunk Assessment: Kyphotic  Communication   Communication Communication: Impaired Factors Affecting Communication: Difficulty expressing self;Reduced clarity of speech    Cognition Arousal: Lethargic Behavior During Therapy: Flat affect, WFL for tasks  assessed/performed   PT - Cognitive impairments: Difficult to assess, No family/caregiver present to determine baseline Difficult to assess due to: Level of arousal, Impaired communication   PT - Cognition Comments: Pt inconsistent with responding verbally during session Following commands: Impaired Following commands impaired: Follows one step commands inconsistently     Cueing Cueing Techniques: Verbal cues, Tactile cues, Visual cues, Gestural cues     General Comments      Exercises     Assessment/Plan    PT Assessment Patient needs continued PT services;All further PT needs can be met in the next venue of care  PT Problem List Decreased strength;Decreased range of motion;Decreased activity tolerance;Decreased balance;Decreased mobility;Decreased knowledge of use of DME;Decreased cognition       PT Treatment Interventions DME instruction;Gait training;Functional mobility training;Therapeutic activities;Therapeutic exercise;Balance training;Patient/family education;Cognitive remediation    PT Goals (Current goals can be found in the Care Plan section)  Acute Rehab PT Goals Patient Stated Goal: Return to SNF PT Goal Formulation: With patient Time For Goal Achievement: 12/29/23 Potential to Achieve Goals: Good    Frequency Min 3X/week     Co-evaluation PT/OT/SLP Co-Evaluation/Treatment: Yes Reason for Co-Treatment: To address functional/ADL transfers PT goals addressed during session: Mobility/safety with mobility         AM-PAC PT 6 Clicks Mobility  Outcome Measure Help needed turning from your back to your side while in a flat bed without using bedrails?: A Lot Help needed moving from lying on your back to sitting on the  side of a flat bed without using bedrails?: A Lot Help needed moving to and from a bed to a chair (including a wheelchair)?: A Lot Help needed standing up from a chair using your arms (e.g., wheelchair or bedside chair)?: A Lot Help needed to  walk in hospital room?: Total Help needed climbing 3-5 steps with a railing? : Total 6 Click Score: 10    End of Session Equipment Utilized During Treatment: Gait belt;Oxygen  Activity Tolerance: Patient limited by lethargy;Other (comment) (cognition) Patient left: in bed;with bed alarm set;with call bell/phone within reach   PT Visit Diagnosis: Other abnormalities of gait and mobility (R26.89);Muscle weakness (generalized) (M62.81);Other symptoms and signs involving the nervous system (R29.898)    Time: 9158-9096 PT Time Calculation (min) (ACUTE ONLY): 22 min   Charges:   PT Evaluation $PT Eval Moderate Complexity: 1 Mod   PT General Charges $$ ACUTE PT VISIT: 1 Visit        11:46 AM, 12/22/23 Rosaria Settler, PT, DPT [place] with [facility]

## 2023-12-22 NOTE — Evaluation (Signed)
 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 Presenting from [facility] for chief complaint of altered mental status with confusion..   Patient is awake but poor historian per record-at the facility patient was found more lethargic this morning stating she was not feeling good, normally she is awake alert.  No reported fever chills nausea vomiting, or active bleeding.     Clinical Impressions Pt admitted for concerns listed above. PTA pt chart indicated that she was from SNF for rehab, where prior she was independent with all ADL's and functional mobility. At this time, pt has a fixed L gaze, limited UE movement, unable to communicate, increased weakness with poor balance. She required total assist +2 for all ADL's and bed mobility, unable to stand at all. OT recommending further skilled therapy, acute OT will continue to follow.      If plan is discharge home, recommend the following:   Two people to help with walking and/or transfers;Two people to help with bathing/dressing/bathroom;Assistance with cooking/housework;Assistance with feeding;Direct supervision/assist for medications management;Assist for transportation;Help with stairs or ramp for entrance;Supervision due to cognitive status     Functional Status Assessment   Patient has had a recent decline in their functional status and demonstrates the ability to make significant improvements in function in a reasonable and predictable amount of time.     Equipment Recommendations   None recommended by OT     Recommendations for Other Services         Precautions/Restrictions    Precautions Precautions: Fall Recall of Precautions/Restrictions: Impaired Restrictions Weight Bearing Restrictions Per Provider Order: No     Mobility Bed Mobility Overal bed mobility: Needs Assistance Bed Mobility: Supine to Sit, Sit to Supine     Supine to sit: Max assist, Total assist Sit to supine: Max assist, Total assist   General bed mobility comments: max/total assist for LE and trunk handling.    Transfers Overall transfer level: Needs assistance Equipment used: None Transfers: Sit to/from Stand Sit to Stand: Max assist, Total assist, +2 physical assistance, +2 safety/equipment           General transfer comment: Max/total assist x2 for stand from bed, 3 attempts total with first 2 unsuccessful, 3rd attempt with elevated bed height with pt demonstrating no active assist, trunk remains flexed, hips/knees flexed      Balance Overall balance assessment: Needs assistance Sitting-balance support: Feet supported, Bilateral upper extremity supported Sitting balance-Leahy Scale: Poor Sitting balance - Comments: pt able to maintain seated balance for ~10-15 secs max before requiring external support seated EOB Postural control: Left lateral lean, Posterior lean Standing balance support: During functional activity, Bilateral upper extremity supported, No upper extremity supported Standing balance-Leahy Scale: Zero Standing balance comment: during standing trial                           ADL either performed or assessed with clinical judgement   ADL Overall ADL's : Needs assistance/impaired Eating/Feeding: Total assistance;Bed level   Grooming: Total assistance;Bed level   Upper Body Bathing: Total assistance;Bed level   Lower Body Bathing: Total assistance;+2 for physical assistance;+2 for safety/equipment;Sit to/from stand;Sitting/lateral leans   Upper  Body Dressing : Total assistance;Bed level   Lower Body Dressing: Total assistance;+2 for physical  assistance;+2 for safety/equipment;Sitting/lateral leans;Sit to/from stand   Toilet Transfer: Total assistance;+2 for physical assistance;+2 for safety/equipment;Squat-pivot   Toileting- Clothing Manipulation and Hygiene: Total assistance;+2 for physical assistance;+2 for safety/equipment;Sit to/from stand;Sitting/lateral lean         General ADL Comments: Pt with decreased responsiveness and unable to follow commands.     Vision Baseline Vision/History: 1 Wears glasses Vision Assessment?: Vision impaired- to be further tested in functional context Additional Comments: Pt with L vision gaze     Perception         Praxis         Pertinent Vitals/Pain Pain Assessment Pain Assessment: Faces Faces Pain Scale: Hurts little more Pain Location: unsure - grimacing with mobility Pain Descriptors / Indicators: Grimacing, Moaning Pain Intervention(s): Limited activity within patient's tolerance, Monitored during session, Repositioned     Extremity/Trunk Assessment Upper Extremity Assessment Upper Extremity Assessment: Difficult to assess due to impaired cognition   Lower Extremity Assessment Lower Extremity Assessment: Defer to PT evaluation   Cervical / Trunk Assessment Cervical / Trunk Assessment: Kyphotic   Communication Communication Communication: Impaired Factors Affecting Communication: Difficulty expressing self;Reduced clarity of speech   Cognition Arousal: Lethargic Behavior During Therapy: Flat affect Cognition: Cognition impaired, Difficult to assess Difficult to assess due to: Impaired communication           OT - Cognition Comments: poor cognition - limited responsiveness                 Following commands: Impaired Following commands impaired: Follows one step commands inconsistently     Cueing  General Comments   Cueing Techniques: Verbal cues;Tactile cues;Visual cues;Gestural cues  VSS on RA   Exercises     Shoulder Instructions       Home Living Family/patient expects to be discharged to:: Skilled nursing facility                                 Additional Comments: Pt arrives to hospital from [facility] due to AMS, been at [facility] for 15 days. Previously lived with daughter.      Prior Functioning/Environment Prior Level of Function : Needs assist             Mobility Comments: Unsure of level of fucntion while at [facility]. Per previous notes, prior to SNF placement, pt ambulated with [facility] mostly with occassional use of RW, history of falls ADLs Comments: Unsure of level of fucntion while at [facility]. Per previous notes, prior to SNF placement, pt was Ind with grooming, toileting, UB ADLs; daughter assists with [facility] ADLs and sup for showers    OT Problem List: Decreased strength;Decreased range of motion;Decreased activity tolerance;Impaired balance (sitting and/or standing);Impaired vision/perception;Decreased coordination;Decreased cognition;Decreased safety awareness;Decreased knowledge of use of DME or AE;Impaired sensation;Impaired UE functional use;Pain   OT Treatment/Interventions: Self-care/ADL training;Energy conservation;Neuromuscular education;Therapeutic exercise;DME and/or AE instruction;Manual therapy;Therapeutic activities;Patient/family education;Balance training      OT Goals(Current goals can be found in the care plan section)   Acute Rehab OT Goals Patient Stated Goal: None stated OT Goal Formulation: Patient unable to participate in goal setting Time For Goal Achievement: 01/05/24 Potential to Achieve Goals: Fair ADL Goals Pt Will Perform Grooming: with mod assist;standing Pt Will Perform Lower Body Bathing: with mod assist;sit to/from stand;sitting/lateral leans Pt Will Perform Lower Body Dressing: with  mod assist;sit to/from stand;sitting/lateral leans Pt Will Transfer to Toilet: with mod assist;stand pivot transfer Pt Will Perform Toileting - Clothing  Manipulation and hygiene: with mod assist;sitting/lateral leans;sit to/from stand   OT Frequency:  Min 1X/week    Co-evaluation PT/OT/SLP Co-Evaluation/Treatment: Yes Reason for Co-Treatment: To address functional/ADL transfers PT goals addressed during session: Mobility/safety with mobility OT goals addressed during session: ADL's and self-care;Strengthening/ROM      AM-PAC OT 6 Clicks Daily Activity     Outcome Measure Help from another person eating meals?: Total Help from another person taking care of personal grooming?: Total Help from another person toileting, which includes using toliet, bedpan, or urinal?: Total Help from another person bathing (including washing, rinsing, drying)?: Total Help from another person to put on and taking off regular upper body clothing?: Total Help from another person to put on and taking off regular lower body clothing?: Total 6 Click Score: 6   End of Session Equipment Utilized During Treatment: Gait belt Nurse Communication: Mobility status  Activity Tolerance: Patient tolerated treatment well Patient left: in bed;with call bell/phone within reach;with bed alarm set  OT Visit Diagnosis: Unsteadiness on feet (R26.81);Other abnormalities of gait and mobility (R26.89);Muscle weakness (generalized) (M62.81)                Time: 8945-8883 OT Time Calculation (min): 22 min Charges:  OT General Charges $OT Visit: 1 Visit OT Evaluation $OT Eval Moderate Complexity: 1 Mod  Karnell Vanderloop, OTR/L [facility]  Trica Usery Elane Thelbert 12/22/2023, 1:17 PM

## 2023-12-22 NOTE — Plan of Care (Signed)
  Problem: Acute Rehab PT Goals(only PT should resolve) Goal: Pt Will Go Supine/Side To Sit Outcome: Progressing Flowsheets (Taken 12/22/2023 1148) Pt will go Supine/Side to Sit: with moderate assist Goal: Patient Will Perform Sitting Balance Outcome: Progressing Flowsheets (Taken 12/22/2023 1148) Patient will perform sitting balance: with contact guard assist Goal: Patient Will Transfer Sit To/From Stand Outcome: Progressing Flowsheets (Taken 12/22/2023 1148) Patient will transfer sit to/from stand: with moderate assist Goal: Pt Will Transfer Bed To Chair/Chair To Bed Outcome: Progressing Flowsheets (Taken 12/22/2023 1148) Pt will Transfer Bed to Chair/Chair to Bed: with mod assist Goal: Pt Will Ambulate Outcome: Progressing Flowsheets (Taken 12/22/2023 1148) Pt will Ambulate:  10 feet  with moderate assist  with least restrictive assistive device    11:49 AM, 12/22/23 Rosaria Settler, PT, DPT [place] with [facility]

## 2023-12-22 NOTE — Consult Note (Signed)
 Cardiology Consultation   Patient ID: SHIARA MCGOUGH MRN: [id number]; DOB: Oct 16, 1950  Admit date: 12/21/2023 Date of Consult: 12/22/2023  PCP:  [facility]   [facility] Providers Cardiologist:  Jayson Sierras, MD        Patient Profile: Becky Colan Ferraz is a 73 y.o. female with a hx of chronic HFrEF/prominent RV dysfunction failure (Echo 11/17/2023: EF 35-40%, severe RV enlargment w/ mildly reduced systolic fx), CAD, HTN, CKD 3B, DM2, OSA, hx of recurrent LE DVT, chronic liver disease who is being seen 12/22/2023 for the evaluation of  Eliquis  use in setting of anemia at the request of Dr. Vicci.  History of Present Illness: Ms. Sedivy has admitted 9/30-10/20/2025 w/ a/c CHF w/ marked volume overload. Further w/u w/ [facility] demonstrated severe RV dominant/ low output heart failure, felt to be end-stage. AHF team consulted to assist with palliative diuresis and inotrope support. Diuresed ~60lbs during admission, milrinone weaned off. Discharge wt 202 lbs. Discharged to SNF with palliative care services to follow.  Discontinued bisoprolol  and Entresto  due to concern for hypotension.  Last seen in advanced HF clinic 12/16/2023 by Beckey Coe, NP for hospital follow up.  Reported SOB with activity, otherwise doing well and denied any other symptoms.  No med changes.  Continued on Eliquis  5 mg twice daily, Lipitor  80 mg daily, Repatha  q. 14 days, spironolactone 25 mg daily, torsemide  60 mg twice daily  Presented to ED from [facility] for AMS and confusion.  K WNL, CR 1.97>1.79, pro BNP 14,392, Tn 232>225, lactic acid 1.9, Hgb 6.4 >9.9, platelets 117K, respiratory viral panel negative, positive Hemoccult EKG:  Sinus tachycardia, HR 106, nonspecific T abnormalities, prolonged QTc 536 MS, no significant changes CXR without any active cardiopulmonary processes. CTA head showed chronic microvascular disease otherwise WNL. GI was consulted.  Patient transfused with 2 units of PRBC.  Offered EGD/colonoscopy, which was consider high risk, however family prefers to hold off on any invasive procedures and prefer more conservative management.   Also treated with IV Lasix  60 mg x1. I/O -2007 ml, wt 183 w/ repeat pending.  Cardiology Dr. Princess was consulted via telephone for clinical decision making for use of Eliquis  and statin of symptomatic anemia suspected due to GI bleed.  Noted the risk of using Eliquis  at this time's outweighs the benefit.  Deferred restart of Eliquis  to care team post EGD.  On interview, patient is a poor historian due to A&O x 0. Per HPI by family med, at the facility patient was found more lethargic this morning stating she was not feeling good, normally she is awake alert. No reported fever chills nausea vomiting, or active bleeding.  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   Type 2 diabetes mellitus (HCC)     Past Surgical History:  Procedure Laterality Date   ABDOMINAL HYSTERECTOMY  partial   CARPAL TUNNEL RELEASE Right 1991   CATARACT EXTRACTION W/PHACO Right 05/08/2013  Procedure: CATARACT EXTRACTION PHACO AND INTRAOCULAR LENS PLACEMENT (IOC);  Surgeon: Cherene Mania, MD;  Location: [facility];  Service: Ophthalmology;  Laterality: Right;  CDE 10.31   CATARACT EXTRACTION W/PHACO Left 08/17/2013   Procedure: CATARACT EXTRACTION PHACO AND INTRAOCULAR LENS PLACEMENT (IOC);  Surgeon: Cherene Mania, MD;  Location: [facility];  Service: Ophthalmology;  Laterality: Left;  CDE:9.03   CHOLECYSTECTOMY  1971    COLONOSCOPY WITH PROPOFOL  N/A 01/06/2016   Dr. Shaaron: diverticulosis    DENTAL SURGERY     ESOPHAGEAL BRUSHING  08/29/2019   Procedure: ESOPHAGEAL BRUSHING;  Surgeon: Shaaron Lamar HERO, MD;  Location: [facility];  Service: Endoscopy;;   ESOPHAGOGASTRODUODENOSCOPY (EGD) WITH PROPOFOL  N/A 01/06/2016   Dr. Shaaron: normal s/p empiric dilation    ESOPHAGOGASTRODUODENOSCOPY (EGD) WITH PROPOFOL  N/A 08/29/2019   esophageal plaques vs medication residue adherent to tubular esophagus s/p KOH brushing and dilation. Medium-sized hiatal hernia. + for candida. Diflucan .    EYE SURGERY     IR BONE MARROW BIOPSY & ASPIRATION  03/31/2022   MALONEY DILATION N/A 01/06/2016   Procedure: AGAPITO DILATION;  Surgeon: Lamar HERO Shaaron, MD;  Location: [facility];  Service: Endoscopy;  Laterality: N/A;   MALONEY DILATION N/A 08/29/2019   Procedure: AGAPITO DILATION;  Surgeon: Shaaron Lamar HERO, MD;  Location: [facility];  Service: Endoscopy;  Laterality: N/A;   REVERSE SHOULDER ARTHROPLASTY Right 08/06/2017   Procedure: RIGHT REVERSE SHOULDER ARTHROPLASTY;  Surgeon: Kay Kemps, MD;  Location: [facility] OR;  Service: Orthopedics;  Laterality: Right;   RIGHT HEART CATH N/A 11/26/2023   Procedure: RIGHT HEART CATH;  Surgeon: Cherrie Toribio SAUNDERS, MD;  Location: [facility];  Service: Cardiovascular;  Laterality: N/A;   WRIST GANGLION EXCISION Left      Home Medications:  Prior to Admission medications   Medication Sig Start Date End Date Taking? Authorizing Provider  albuterol  (PROVENTIL ) (2.5 MG/3ML) 0.083% nebulizer solution Take 2.5 mg by nebulization every 6 (six) hours as needed for wheezing or shortness of breath.   Yes [provider]  albuterol  (VENTOLIN  HFA) 108 (90 Base) MCG/ACT inhaler Inhale 1-2 puffs into the lungs every 6 (six) hours as needed for wheezing or shortness of breath. 03/21/21  Yes Stuart Vernell Norris, PA-C  aluminum-magnesium  hydroxide-simethicone (MAALOX) 200-200-20 MG/5ML SUSP  Take 5 mLs by mouth as needed (GERD).   Yes [provider]  apixaban  (ELIQUIS ) 5 MG TABS tablet Take 1 tablet (5 mg total) by mouth 2 (two) times daily. Patient taking differently: Take 5 mg by mouth 2 (two) times daily. 0800, 2000 05/21/21  Yes Arrien, Elidia Toribio, MD  ascorbic acid  (VITAMIN C ) 500 MG tablet Take 500 mg by mouth daily.   Yes [provider]  atorvastatin  (LIPITOR ) 80 MG tablet Take 1 tablet (80 mg total) by mouth daily. 05/22/21 06/24/58 Yes Arrien, Elidia Toribio, MD  cetirizine  (ZYRTEC ) 10 MG tablet Take 10 mg by mouth daily.   Yes [provider]  cholecalciferol  (VITAMIN D3) 25 MCG (1000 UNIT) tablet Take 3,000 Units by mouth daily.   Yes [provider]  cimetidine  (TAGAMET ) 200 MG tablet Take 0.5 tablets (100 mg total) by mouth daily as needed. Patient taking differently: Take 100 mg by mouth daily as needed (heartburn). 03/22/20  Yes Ricky Fines, MD  CINNAMON PO Take 2 capsules by mouth 3 (three) times daily.   Yes [provider]  clobetasol  ointment (TEMOVATE ) 0.05 % Apply 1 application  topically as needed (inflammation). 06/20/15  Yes  [provider]  DULoxetine  (CYMBALTA ) 60 MG capsule Take 60 mg by mouth 2 (two) times daily.   Yes [provider]  Evolocumab  (REPATHA  SURECLICK) 140 MG/ML SOAJ Inject 140 mg into the skin every 14 (fourteen) days. 03/18/23  Yes McCue, Harlene, NP  fluticasone  (FLONASE ) 50 MCG/ACT nasal spray Place 2 sprays into both nostrils as needed (congestion). 04/05/23  Yes [provider]  guaiFENesin  (ROBITUSSIN) 100 MG/5ML SOLN Take 5 mLs (100 mg total) by mouth every 4 (four) hours as needed for cough or to loosen phlegm. Patient taking differently: Take 10 mLs by mouth every 4 (four) hours as needed for cough. 03/17/20  Yes Shah, Pratik D, DO  hydrOXYzine  (ATARAX ) 25 MG tablet Take 1 tablet (25 mg total) by mouth every 8 (eight) hours as needed for itching. 12/06/23  Yes Arrien,  Elidia Sieving, MD  insulin  glargine (LANTUS ) 100 UNIT/ML injection Inject 15 Units into the skin at bedtime. 2200   Yes [provider]  levothyroxine  (SYNTHROID ) 100 MCG tablet Take 1 tablet (100 mcg total) by mouth daily before breakfast. 10/21/23  Yes Reardon, Benton PARAS, NP  lidocaine  (LIDODERM ) 5 % Place 1 patch onto the skin daily. Remove & Discard patch within 12 hours or as directed by MD 01/29/22  Yes Leath-Warren, Etta PARAS, NP  lubiprostone  (AMITIZA ) 24 MCG capsule Take 1 capsule (24 mcg total) by mouth 2 (two) times daily with a meal. Patient taking differently: Take 24 mcg by mouth every 12 (twelve) hours as needed for constipation. 01/21/22  Yes Ezzard Sonny RAMAN, PA-C  methocarbamol  (ROBAXIN ) 500 MG tablet Take 500 mg by mouth every 8 (eight) hours as needed for muscle spasms.   Yes [provider]  pantoprazole  (PROTONIX ) 40 MG tablet Take 1 tablet (40 mg total) by mouth daily before breakfast. 01/21/22  Yes Ezzard Sonny RAMAN, PA-C  potassium chloride  SA (KLOR-CON  M) 20 MEQ tablet Take 20 mEq by mouth once. 12/21/23 12/21/23 Yes [provider]  pregabalin  (LYRICA ) 75 MG capsule Take 1 capsule (75 mg total) by mouth daily. 12/07/23  Yes Arrien, Elidia Sieving, MD  RESTASIS 0.05 % ophthalmic emulsion Place 1 drop into both eyes 2 (two) times daily. 08/21/20  Yes [provider]  Simethicone 125 MG CAPS Take 1 capsule by mouth daily as needed (gas).   Yes [provider]  spironolactone (ALDACTONE) 25 MG tablet Take 25 mg by mouth daily.   Yes [provider]  Tiotropium Bromide-Olodaterol (STIOLTO RESPIMAT ) 2.5-2.5 MCG/ACT AERS Inhale 2 puffs into the lungs daily. 03/19/23  Yes Darlean Ozell NOVAK, MD  tirzepatide  (MOUNJARO ) 12.5 MG/0.5ML Pen Inject 12.5 mg into the skin once a week. Patient taking differently: Inject 12.5 mg into the skin every Monday. 06/14/23  Yes Therisa Benton PARAS, NP  torsemide  60 MG TABS Take 60 mg by mouth 2 (two) times  daily. 12/06/23  Yes Arrien, Mauricio Daniel, MD  traMADol  (ULTRAM ) 50 MG tablet Take 1 tablet (50 mg total) by mouth every 8 (eight) hours as needed for severe pain (pain score 7-10). 12/06/23  Yes Arrien, Elidia Sieving, MD  traZODone  (DESYREL ) 150 MG tablet Take 150 mg by mouth every evening. 1900 04/07/22  Yes [provider]  ursodiol (ACTIGALL) 300 MG capsule Take 300 mg by mouth 2 (two) times daily.   Yes [provider]  Accu-Chek FastClix Lancets MISC Use 1 lancet to check blood glucose four (4) times daily as directed by provider. 09/23/23   Therisa Benton PARAS, NP  ACCU-CHEK GUIDE test strip 4 (four) times daily. 07/02/20   [provider]  Alcohol Swabs (ALCOHOL PADS) 70 % PADS SMARTSIG:Pledget(s) Topical 4 Times Daily 08/12/20   [provider]  Blood Glucose Calibration (ACCU-CHEK GUIDE CONTROL) LIQD See admin instructions. 07/02/20   [provider]  Blood Glucose Monitoring Suppl (ACCU-CHEK GUIDE ME) w/Device KIT Use Glucose Meter to monitor blood glucose readings four(4) times daily as directed by provider. 09/23/23   Therisa Benton PARAS, NP  Continuous Glucose Receiver (FREESTYLE LIBRE 3 READER) DEVI USE TO MONITOR GLUCOSE CONTINUOUSLY AS DIRECTED 06/15/23   Therisa Benton PARAS, NP  Continuous Glucose Sensor (FREESTYLE LIBRE 3 PLUS SENSOR) MISC USE TO MONITOR GLUCOSE CONTINUOUSLY AS DIRECTED. CHANGE SENSOR EVERY 15 DAYS. 06/17/23   Therisa Benton PARAS, NP  EASY COMFORT PEN NEEDLES 31G X 5 MM MISC INJECT IN SULIN EVERY DAY AS DIRECTED 06/02/21   [provider]  glucose blood (ACCU-CHEK GUIDE TEST) test strip Use 1 test strip four(4) times daily to monitor blood glucose as directed by provider. 09/23/23   Therisa Benton PARAS, NP  Lancet Devices (EASY MINI EJECT LANCING DEVICE) MISC 4 (four) times daily. 07/02/20   [provider]  Skin Protectants, Misc. (INTERDRY [id number]) SHEE Change pad daily or if soiled 10/21/23   Therisa Benton PARAS, NP     Scheduled Meds:  Chlorhexidine  Gluconate Cloth  6 each Topical Daily   cycloSPORINE  1 drop Both Eyes BID   DULoxetine   60 mg Oral BID   insulin  aspart  0-9 Units Subcutaneous TID WC   insulin  glargine-yfgn  10 Units Subcutaneous QHS   lactulose  20 g Oral TID   levothyroxine   100 mcg Oral QAC breakfast   pantoprazole  (PROTONIX ) IV  40 mg Intravenous Q12H   pregabalin   75 mg Oral Daily   senna-docusate  1 tablet Oral QHS   sodium chloride  flush  3 mL Intravenous Q12H   sodium chloride  flush  3 mL Intravenous Q12H   torsemide   60 mg Oral BID   traZODone   100 mg Oral QHS   Continuous Infusions:  PRN Meds: acetaminophen  **OR** acetaminophen , bisacodyl , hydrALAZINE , HYDROmorphone  (DILAUDID ) injection, ipratropium, ondansetron  **OR** ondansetron  (ZOFRAN ) IV, oxyCODONE , sodium phosphate   Allergies:    Allergies  Allergen Reactions   Tetracyclines & Related Anaphylaxis, Dermatitis and Rash   Banana Hives and Nausea And Vomiting   Jardiance  [Empagliflozin ] Other (See Comments)    Yeast infections, fatigue   Penicillins Rash    Social History:   Social History   Socioeconomic History   Marital status: Divorced    Spouse name: Not on file   Number of children: Not on file   Years of education: Not on file   Highest education level: Not on file  Occupational History   Not on file  Tobacco Use   Smoking status: Former    Current packs/day: 0.00    Average packs/day: 0.3 packs/day for 30.0 years (7.5 ttl pk-yrs)    Types: Cigarettes    Start date: 02/16/1981    Quit date: 02/17/2011    Years since quitting: 12.8   Smokeless tobacco: Never  Vaping Use   Vaping status: Never Used  Substance and Sexual Activity   Alcohol use: Not Currently    Comment: rare   Drug use: No   Sexual activity: Not Currently    Birth control/protection: Surgical  Other Topics Concern   Not on file  Social History Narrative   Pt lives with daughter  Retired    Family History:  Family  History  Problem Relation Age of Onset   Hypertension Mother    Diabetes Mother    COPD Mother    Arthritis Mother    Diabetes Father    Arthritis Father    Dementia Father    CAD Father    Hypothyroidism Sister    Diabetes Brother    Stroke Paternal Aunt    Colon cancer Niece    Colon polyps Neg Hx    Sleep apnea Neg Hx      ROS:  Please see the history of present illness.  All other ROS reviewed and negative.     Physical Exam/Data: Vitals:   12/21/23 2313 12/22/23 0106 12/22/23 0500 12/22/23 0845  BP: 125/85 117/77  132/79  Pulse: (!) 102 (!) 107  (!) 111  Resp:      Temp: 98 F (36.7 C) 97.7 F (36.5 C)    TempSrc: Oral Oral    SpO2: 100% 100%  100%  Weight:   83.4 kg   Height:        Intake/Output Summary (Last 24 hours) at 12/22/2023 1141 Last data filed at 12/22/2023 1028 Gross per 24 hour  Intake 743 ml  Output 2750 ml  Net -2007 ml      12/22/2023    5:00 AM 12/21/2023   12:28 PM 12/16/2023   10:09 AM  Last 3 Weights  Weight (lbs) 183 lb 13.8 oz 185 lb 3 oz 183 lb 6.4 oz  Weight (kg) 83.4 kg 84 kg 83.19 kg     Body mass index is 27.96 kg/m.  General:  A&O x0 , confused HEENT: normal Neck: no JVD Vascular: No carotid bruits; Distal pulses 2+ bilaterally Cardiac:  normal S1, S2; RRR; no murmur  Lungs:  clear to auscultation bilaterally, no wheezing, rhonchi or rales  Abd: soft, nontender, no hepatomegaly  Ext: no edema Musculoskeletal:  No deformities, BUE and BLE strength normal and equal Skin: warm and dry  Neuro:  CNs 2-12 intact, no focal abnormalities noted Psych:  Normal affect   EKG:  The EKG was personally reviewed and demonstrates:  Sinus tachycardia, HR 106, nonspecific T abnormalities, prolonged QTc 536 MS, no significant changes Telemetry:  Telemetry was personally reviewed and demonstrates:  ST, HR 100's  Relevant CV Studies: ECHO 11/17/2023 IMPRESSIONS   1. Left ventricular ejection fraction, by estimation, is 35 to 40%. Left   ventricular ejection fraction by 3D volume is 33 %. The left ventricle has  moderately decreased function. The left ventricle demonstrates regional  wall motion abnormalities (see  scoring diagram/findings for description). The left ventricular internal  cavity size was mildly dilated. Left ventricular diastolic parameters are  indeterminate.   2. Right ventricular systolic function is mildly reduced. The right  ventricular size is severely enlarged. Moderately increased right  ventricular wall thickness. There is normal pulmonary artery systolic  pressure. The estimated right ventricular  systolic pressure is 31.8 mmHg.   3. Large pleural effusion.   4. The mitral valve is normal in structure. Mild mitral valve  regurgitation. No evidence of mitral stenosis.   5. The aortic valve is tricuspid. Aortic valve regurgitation is not  visualized. No aortic stenosis is present.   6. The inferior vena cava is dilated in size with >50% respiratory  variability, suggesting right atrial pressure of 8 mmHg.   RHC 11/26/2023 Findings:   RA =  27 RV = 52/26 PA =  48/26 (36) PCW =  22 Fick cardiac output/index = 6.3/2.8 Thermo CO/CI = 4.0/1.8 PVR = 2.2 (Fick) 3.5 (TD) Ao sat = 97% PA sat = 62%, 64% PAPi = 0.81   Assessment: 1. 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 Can consider inpatient trial of milrinone and IV diuresis to optimize but would strongly consider Palliative Care involvement.  Laboratory Data: High Sensitivity Troponin:  No results for input(s): TROPONINIHS in the last 720 hours.   Chemistry Recent Labs  Lab 12/16/23 1038 12/21/23 1251 12/21/23 1519 12/22/23 0423  NA 139 140  --  144  K 3.4* 4.4  --  4.4  CL 102 103  --  105  CO2 27 27  --  26  GLUCOSE 171* 110*  --  116*  BUN 41* 52*  --  54*  CREATININE 1.79* 1.97*  --  1.79*   CALCIUM  7.5* 9.1  --  9.5  MG  --   --  2.1  --   GFRNONAA 30* 26*  --  29*  ANIONGAP 10 11  --  13    Recent Labs  Lab 12/21/23 1251  PROT 8.1  ALBUMIN 3.1*  AST 45*  ALT 16  ALKPHOS 212*  BILITOT 1.3*   Lipids  Recent Labs  Lab 12/21/23 1541  CHOL 122  TRIG 75  HDL 56  LDLCALC 51  CHOLHDL 2.2    Hematology Recent Labs  Lab 12/21/23 1251 12/21/23 1519 12/22/23 0423  WBC 9.9  --  11.3*  RBC 2.39* 2.41* 3.49*  HGB 6.4*  --  9.9*  HCT 22.0*  --  30.7*  MCV 92.1  --  88.0  MCH 26.8  --  28.4  MCHC 29.1*  --  32.2  RDW 17.3*  --  16.1*  PLT 177  --  169   Thyroid  No results for input(s): TSH, FREET4 in the last 168 hours.  BNP Recent Labs  Lab 12/16/23 1038 12/22/23 0423  BNP 1,088.4*  --   PROBNP  --  14,392.0*    DDimer No results for input(s): DDIMER in the last 168 hours.  Radiology/Studies:  CT Head Wo Contrast Result Date: 12/21/2023 CLINICAL DATA:  Altered mental status. EXAM: CT HEAD WITHOUT CONTRAST TECHNIQUE: Contiguous axial images were obtained from the base of the skull through the vertex without intravenous contrast. RADIATION DOSE REDUCTION: This exam was performed according to the departmental dose-optimization program which includes automated exposure control, adjustment of the mA and/or kV according to patient size and/or use of iterative reconstruction technique. COMPARISON:  09/15/2017 FINDINGS: Brain: Ventricles, cisterns and other CSF spaces are normal. There is no mass, mass effect, shift of midline structures or acute hemorrhage. There is mild chronic ischemic microvascular disease. No acute infarction. Vascular: No hyperdense vessel or unexpected calcification. Skull: Normal. Negative for fracture or focal lesion. Sinuses/Orbits: No acute finding. Other: None. IMPRESSION: 1. No acute findings. 2. Mild chronic ischemic microvascular disease. Electronically Signed   By: Toribio Agreste M.D.   On: 12/21/2023 14:07   DG Chest Port 1  View Result Date: 12/21/2023 EXAM: 1 VIEW(S) XRAY OF THE CHEST 12/21/2023 01:12:00 PM COMPARISON: 12/01/2023 CLINICAL HISTORY: Questionable sepsis - evaluate for abnormality FINDINGS: LINES, TUBES AND DEVICES: Right PICC line removed. LUNGS AND PLEURA: Vascular congestion and interstitial pulmonary edema Possible small bilateral pleural effusions. No pneumothorax. HEART AND MEDIASTINUM: Heart size at upper limits of normal but  stable. BONES AND SOFT TISSUES: Right shoulder arthroplasty noted. IMPRESSION: 1. Pulmonary edema and small bilateral pleural effusions. Electronically signed by: Maude Stammer MD 12/21/2023 01:43 PM EST RP Workstation: [id number]     Assessment and Plan: Hx of recurrent LE DVT  Acute on chronic anemia/hemoccult positive:  Hgb 6.4 >9.9 with 2 units of PRBC. GI offered EGD/colonoscopy, which was consider high risk, however family prefers to hold off on any invasive procedures and prefer more conservative management.   Eliquis  on hold due to severe anemia suspected due to GI bleed. Would recommend continuing to hold Eliquis  with risk >>> benefits.  Managed by GI.   Acute on chronic HFrEF RV Dysfunction  Echo 11/17/2023: EF 35-40%, severe RV enlargment w/ mildly reduced systolic fx, estimated RVSP 32 mmHg. Mild MR/TR  RHC 11/26/2023: RA 27, PA 48/26, PCW 22, PVR 2.2, TD CI 1.8, PAPi 0.81>>Likely end-stage physiology  pro BNP 14,392 Treated with IV Lasix  60 mg x1. I/O -2007 ml, wt 183 w/ repeat pending (12/06/2023 wt 202lbs) Appear euvolemic on exam. Continue Torsemide  60 mg BID.  Suspect Spiro 25 mg not given due to AMS.  Not candidate for transplant given age, obesity and other co morbidities Not candidate for LVAD given severity of RV failure GDMT limited by CKD. Avoid SGLT2i due to hx of UTI's.  She is in end-stage.  Recently discharged to SNF with palliative support but refused hospice.  CKD 3B CR 1.97>1.79 (Baseline CR  1.9)  Continue to monitor   CAD   Coronary artery classifications by CT imaging TN 232>225 EKG w/o ischemic changes No able to assess if any chest pain due to AMS.  Lipitor  80 mg discontinued due to unclear reasoning. Suspect not given due to AMS.  Would not recommend cath based on AMS and unclear history. Can reassess once mental status improve.   AMS  Managed per primary team Suspect multifactorial likely secondary to polypharmacy, edema, rule out infection, rule out acute CVA   Risk Assessment/Risk Scores:    TIMI Risk Score for Unstable Angina or Non-ST Elevation MI:   The patient's TIMI risk score is 2, which indicates a 8% risk of all cause mortality, new or recurrent myocardial infarction or need for urgent revascularization in the next 14 days.       For questions or updates, please contact [facility] Please consult [email or web address] for contact info under      Signed, Lorette CINDERELLA Kapur, PA-C  12/22/2023 11:41 AM

## 2023-12-22 NOTE — Progress Notes (Signed)
 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 Pt presented from [facility] for chief complaint of altered mental status with confusion.  Patient is awake but poor historian per record-at the facility patient was found more lethargic this morning stating she was not feeling good, normally she is awake alert.  No reported fever chills nausea vomiting, or active bleeding.  ED Evaluation: Blood pressure 123/75, pulse (!) 104, temperature 98.9 F (37.2 C), temperature source Axillary, resp. rate (!) 21, height 5' 8 (1.727 m), weight 84 kg, SpO2 100%. Labs: CMP within normal exception of glucose 110, BUN 52, creatinine 1.97, alk phos 212, albumin 2.1, AST 45, T. bili 1.3, GFR 26, BNP 1088.4, lactic acid 1.9,  CBC: Hemoglobin of 6.4.  Platelets 117 Respiratory viral panel negative Hemoccult positive  CTA head chronic microvascular disease otherwise within normal limits, chest x-ray clear  EDP consulted gastroenterologist, ordered 2 units of PRBC to be transfused, 60 mg of IV Lasix  was given.  Requested patient to be admitted for further evaluation.    Assessment and Plan:  GIB (gastrointestinal bleeding) - Acute GI bleed in the setting of chronic anticoagulation with Eliquis  Hemoglobin & Hematocrit     Component Value Date/Time   HGB 9.9 (L) 12/22/2023 0423   HCT 30.7 (L) 12/22/2023 0423   - Hg back up to 9.9 s/p 2U PRBC blood transfusion -Occult positive -Holding home dose Eliquis  -Continuing IV Protonix  40 mg twice daily -GI consulted, family decided against procedures at this time; medical mgmt  only - recheck CBC in AM  Altered mental status, unspecified Hepatic Encephalopathy Altered mental status with confusion - MRI brain negative for acute findings - ammonia level elevated, agree with lactulose administration - Will continue to monitor closely, continue with neurochecks  Anemia due to chronic kidney disease Acute on chronic anemia due to chronic disease and CKD -Acute anemia due to blood loss - GI bleed -Holding home dose Eliquis  -Hg up to 9.9 after 2 units PRBC  Advanced hepatic fibrosis Chronic hepatic fibrosis With chronic mildly abnormal LFTs, monitor closely  Fibromyalgia - Reviewing home medication: Atarax , Robaxin , Lyrica , trazodone  Modifying meds due to altered mental status  Hyperglycemia due to type 2 diabetes mellitus Last A1c 6.6 on 11/23/2023 -Holding home regimen, checking CBG q. 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 H/o  A-fib -  no records Per electronic record electronic records, no documentation -Seems that patient is on Eliquis  for history of DVT and prior  CVA -Currently normal sinus rhythm  Acute embolism and thrombosis of unspecified deep veins of lower extremity, bilateral - Chronically anticoagulated on Eliquis , on hold now due to GI bleed No signs of edema, erythema or pain in lower extremities  Acute on chronic combined systolic and diastolic CHF (congestive heart failure) Chronic combined systolic and diastolic heart failure- HFrEF TEE 11/17/2023 showing reduced LVEF 35-40%, regional wall motion abnormalities, mildly reduced RV function -Patient has been evaluated by heart failure team on 12/06/2023, has been diuresed with oral torsemide  on outpatient setting -In ED patient has received 60 mg of IV Lasix , will resume torsemide  orally in a.m. -Poor candidate for SGL 2 inhibitor due to frequent yeast infection, not a candidate for Entresto  due to worsening creatinine -Medications reviewed continuing bisoprolol   Hypothyroidism Continue home dose Synthroid   Obesity Body mass index is 28.16 kg/m. Stable weight  COPD (chronic obstructive pulmonary disease) - Currently stable, continue to monitor, no signs of exacerbation -Continue inhalers, DuoNeb bronchodilators  GERD (gastroesophageal reflux disease) Hold p.o. PPI, initiating IV Protonix  twice daily 40 mg  Hypertension Holding Aldactone, continue torsemide , -Trend BP closely  DVT prophylaxis: SCDs Code Status: Full  Family Communication:  Disposition: SNF   Consultants:   Procedures:   Antimicrobials:    Subjective: Pt remains severely encephalopathic and unable to give meaningful response.   Objective: Vitals:   12/21/23 2313 12/22/23 0106 12/22/23 0500 12/22/23 0845  BP: 125/85 117/77  132/79  Pulse: (!) 102 (!) 107  (!) 111  Resp:      Temp: 98 F (36.7 C) 97.7 F (36.5 C)    TempSrc: Oral Oral    SpO2: 100% 100%  100%  Weight:   83.4 kg   Height:        Intake/Output Summary (Last 24 hours) at 12/22/2023 1528 Last data filed at 12/22/2023 1225 Gross  per 24 hour  Intake 743 ml  Output 3050 ml  Net -2307 ml   Filed Weights   12/21/23 1228 12/22/23 0500  Weight: 84 kg 83.4 kg   Examination:  General exam: Appears calm and comfortable  Respiratory system: Clear to auscultation. Respiratory effort normal. Cardiovascular system: normal S1 & S2 heard. No JVD, murmurs, rubs, gallops or clicks. No pedal edema. Gastrointestinal system: Abdomen is nondistended, soft and nontender. No organomegaly or masses felt. Normal bowel sounds heard. Central nervous system: Alert and disoriented. Mild right hemiparesis.  Extremities: no gross lesions, mild right hemiparesis. Skin: No rashes, lesions or ulcers. Psychiatry: Judgement and insight appear severely diminished. Mood & affect appropriate.   Data Reviewed: I have personally reviewed following labs and imaging studies  CBC: Recent Labs  Lab 12/21/23 1251 12/22/23 0423  WBC 9.9 11.3*  NEUTROABS 6.1  --   HGB 6.4* 9.9*  HCT 22.0* 30.7*  MCV 92.1 88.0  PLT 177 169    Basic Metabolic Panel: Recent Labs  Lab 12/16/23 1038 12/21/23 1251 12/21/23 1519 12/22/23 0423  NA 139 140  --  144  K 3.4* 4.4  --  4.4  CL 102 103  --  105  CO2 27 27  --  26  GLUCOSE 171* 110*  --  116*  BUN 41* 52*  --  54*  CREATININE 1.79* 1.97*  --  1.79*  CALCIUM  7.5* 9.1  --  9.5  MG  --   --  2.1  --  PHOS  --   --  4.0  --     CBG: Recent Labs  Lab 12/21/23 1752 12/21/23 1952 12/22/23 0722  GLUCAP 103* 118* 103*    Recent Results (from the past 240 hours)  Blood Culture (routine x 2)     Status: None (Preliminary result)   Collection Time: 12/21/23 12:51 PM   Specimen: BLOOD  Result Value Ref Range Status   Specimen Description   Final    BLOOD RIGHT ARM Performed at [facility], [address] [place], [place] [address]    Special Requests   Final    BOTTLES DRAWN AEROBIC AND ANAEROBIC Blood Culture adequate volume Performed at [facility], [address] [place], [place]  [address]    Culture   Final    NO GROWTH 1 DAY Performed at [facility], [address], [place], [place] [address]    Report Status PENDING  Incomplete  Resp panel by RT-PCR (RSV, Flu A&B, Covid) Anterior Nasal Swab     Status: None   Collection Time: 12/21/23 12:51 PM   Specimen: Anterior Nasal Swab  Result Value Ref Range Status   SARS Coronavirus 2 by RT PCR NEGATIVE NEGATIVE Final    Comment: (NOTE) SARS-CoV-2 target nucleic acids are NOT DETECTED.  The SARS-CoV-2 RNA is generally detectable in upper respiratory specimens during the acute phase of infection. The lowest concentration of SARS-CoV-2 viral copies this assay can detect is 138 copies/mL. A negative result does not preclude SARS-Cov-2 infection and should not be used as the sole basis for treatment or other patient management decisions. A negative result may occur with  improper specimen collection/handling, submission of specimen other than nasopharyngeal swab, presence of viral mutation(s) within the areas targeted by this assay, and inadequate number of viral copies(<138 copies/mL). A negative result must be combined with clinical observations, patient history, and epidemiological information. The expected result is Negative.  Fact Sheet for Patients:  bloggercourse.com  Fact Sheet for Healthcare Providers:  seriousbroker.it  This test is no t yet approved or cleared by the [place]  FDA and  has been authorized for detection and/or diagnosis of SARS-CoV-2 by FDA under an Emergency Use Authorization (EUA). This EUA will remain  in effect (meaning this test can be used) for the duration of the COVID-19 declaration under Section 564(b)(1) of the Act, 21 U.S.C.section 360bbb-3(b)(1), unless the authorization is terminated  or revoked sooner.       Influenza A by PCR NEGATIVE NEGATIVE Final   Influenza B by PCR NEGATIVE NEGATIVE Final    Comment:  (NOTE) The Xpert Xpress SARS-CoV-2/FLU/RSV plus assay is intended as an aid in the diagnosis of influenza from Nasopharyngeal swab specimens and should not be used as a sole basis for treatment. Nasal washings and aspirates are unacceptable for Xpert Xpress SARS-CoV-2/FLU/RSV testing.  Fact Sheet for Patients: bloggercourse.com  Fact Sheet for Healthcare Providers: seriousbroker.it  This test is not yet approved or cleared by the [place]  FDA and has been authorized for detection and/or diagnosis of SARS-CoV-2 by FDA under an Emergency Use Authorization (EUA). This EUA will remain in effect (meaning this test can be used) for the duration of the COVID-19 declaration under Section 564(b)(1) of the Act, 21 U.S.C. section 360bbb-3(b)(1), unless the authorization is terminated or revoked.     Resp Syncytial Virus by PCR NEGATIVE NEGATIVE Final    Comment: (NOTE) Fact Sheet for Patients: bloggercourse.com  Fact Sheet for Healthcare Providers: seriousbroker.it  This  test is not yet approved or cleared by the [place]  FDA and has been authorized for detection and/or diagnosis of SARS-CoV-2 by FDA under an Emergency Use Authorization (EUA). This EUA will remain in effect (meaning this test can be used) for the duration of the COVID-19 declaration under Section 564(b)(1) of the Act, 21 U.S.C. section 360bbb-3(b)(1), unless the authorization is terminated or revoked.  Performed at [facility], [address] [place], [place] [address]   Blood Culture (routine x 2)     Status: None (Preliminary result)   Collection Time: 12/21/23  3:19 PM   Specimen: BLOOD  Result Value Ref Range Status   Specimen Description   Final    BLOOD BLOOD LEFT HAND Performed at [facility], [address], [place], [place] [address]    Special Requests   Final    BOTTLES DRAWN AEROBIC ONLY Blood  Culture results may not be optimal due to an inadequate volume of blood received in culture bottles Performed at [facility], [address], [place], [place] [address]    Culture   Final    NO GROWTH < 24 HOURS Performed at [facility], [address] [place], [place] [address]    Report Status PENDING  Incomplete     Radiology Studies: MR BRAIN WO CONTRAST Result Date: 12/22/2023 EXAM: MRI BRAIN WITHOUT CONTRAST 12/22/2023 01:36:20 PM TECHNIQUE: Multiplanar multisequence MRI of the head/brain was performed without the administration of intravenous contrast. COMPARISON: None available. CLINICAL HISTORY: Mental status change, unknown cause. FINDINGS: Motion limited and incomplete study due to patient intolerance. The following motion limited sequences were obtained: DWI/ADC, sagittal T1, axial T2 and FLAIR. Within this limitation: BRAIN AND VENTRICLES: No acute infarct. No intracranial hemorrhage. No mass. No midline shift. No hydrocephalus. Normal flow voids. Scattered T2 hyperintensities in the white matter, nonspecific and poorly evaluated due to motion. ORBITS: No acute abnormality. SINUSES AND MASTOIDS: No acute abnormality. BONES AND SOFT TISSUES: Normal marrow signal. No acute soft tissue abnormality. IMPRESSION: 1. Motion limited and incomplete study without obvious evidence of acute intracranial abnormality. Electronically signed by: Gilmore Molt MD 12/22/2023 02:31 PM EST RP Workstation: [id number]   CT Head Wo Contrast Result Date: 12/21/2023 CLINICAL DATA:  Altered mental status. EXAM: CT HEAD WITHOUT CONTRAST TECHNIQUE: Contiguous axial images were obtained from the base of the skull through the vertex without intravenous contrast. RADIATION DOSE REDUCTION: This exam was performed according to the departmental dose-optimization program which includes automated exposure control, adjustment of the mA and/or kV according to patient size and/or use of iterative reconstruction  technique. COMPARISON:  09/15/2017 FINDINGS: Brain: Ventricles, cisterns and other CSF spaces are normal. There is no mass, mass effect, shift of midline structures or acute hemorrhage. There is mild chronic ischemic microvascular disease. No acute infarction. Vascular: No hyperdense vessel or unexpected calcification. Skull: Normal. Negative for fracture or focal lesion. Sinuses/Orbits: No acute finding. Other: None. IMPRESSION: 1. No acute findings. 2. Mild chronic ischemic microvascular disease. Electronically Signed   By: Toribio Agreste M.D.   On: 12/21/2023 14:07   DG Chest Port 1 View Result Date: 12/21/2023 EXAM: 1 VIEW(S) XRAY OF THE CHEST 12/21/2023 01:12:00 PM COMPARISON: 12/01/2023 CLINICAL HISTORY: Questionable sepsis - evaluate for abnormality FINDINGS: LINES, TUBES AND DEVICES: Right PICC line removed. LUNGS AND PLEURA: Vascular congestion and interstitial pulmonary edema Possible small bilateral pleural effusions. No pneumothorax. HEART AND MEDIASTINUM: Heart size at upper limits of normal but stable. BONES AND SOFT TISSUES: Right shoulder  arthroplasty noted. IMPRESSION: 1. Pulmonary edema and small bilateral pleural effusions. Electronically signed by: Maude Stammer MD 12/21/2023 01:43 PM EST RP Workstation: [id number]    Scheduled Meds:  Chlorhexidine  Gluconate Cloth  6 each Topical Daily   cycloSPORINE  1 drop Both Eyes BID   DULoxetine   60 mg Oral BID   insulin  aspart  0-9 Units Subcutaneous TID WC   insulin  glargine-yfgn  10 Units Subcutaneous QHS   lactulose  20 g Oral TID   levothyroxine   100 mcg Oral QAC breakfast   pantoprazole  (PROTONIX ) IV  40 mg Intravenous Q12H   pregabalin   75 mg Oral Daily   senna-docusate  1 tablet Oral QHS   sodium chloride  flush  3 mL Intravenous Q12H   sodium chloride  flush  3 mL Intravenous Q12H   torsemide   60 mg Oral BID   traZODone   100 mg Oral QHS   Continuous Infusions:   LOS: 0 days   Time spent: 57 mins  Suly Vukelich Vicci, MD How  to contact the [facility] Attending or Consulting provider 7A - 7P or covering provider during after hours 7P -7A, for this patient?  Check the care team in [facility] and look for a) attending/consulting TRH provider listed and b) the TRH team listed Log into [email or web address] to find provider on call.  Locate the TRH provider you are looking for under [facility] and page to a number that you can be directly reached. If you still have difficulty reaching the provider, please page the [facility] (Director on Call) for the Hospitalists listed on amion for assistance.  12/22/2023, 3:28 PM

## 2023-12-23 DIAGNOSIS — R4182 Altered mental status, unspecified: Secondary | ICD-10-CM | POA: Diagnosis not present

## 2023-12-23 DIAGNOSIS — K7682 Hepatic encephalopathy: Secondary | ICD-10-CM | POA: Diagnosis not present

## 2023-12-23 DIAGNOSIS — R195 Other fecal abnormalities: Secondary | ICD-10-CM | POA: Diagnosis not present

## 2023-12-23 DIAGNOSIS — R7989 Other specified abnormal findings of blood chemistry: Secondary | ICD-10-CM | POA: Diagnosis not present

## 2023-12-23 DIAGNOSIS — I4891 Unspecified atrial fibrillation: Secondary | ICD-10-CM | POA: Diagnosis not present

## 2023-12-23 DIAGNOSIS — R410 Disorientation, unspecified: Secondary | ICD-10-CM

## 2023-12-23 DIAGNOSIS — Z7901 Long term (current) use of anticoagulants: Secondary | ICD-10-CM

## 2023-12-23 DIAGNOSIS — K922 Gastrointestinal hemorrhage, unspecified: Secondary | ICD-10-CM | POA: Diagnosis not present

## 2023-12-23 LAB — BASIC METABOLIC PANEL WITH GFR
Anion gap: 13 (ref 5–15)
BUN: 49 mg/dL — ABNORMAL HIGH (ref 8–23)
CO2: 27 mmol/L (ref 22–32)
Calcium: 9.9 mg/dL (ref 8.9–10.3)
Chloride: 106 mmol/L (ref 98–111)
Creatinine, Ser: 1.78 mg/dL — ABNORMAL HIGH (ref 0.44–1.00)
GFR, Estimated: 30 mL/min — ABNORMAL LOW (ref >60)
Glucose, Bld: 105 mg/dL — ABNORMAL HIGH (ref 70–99)
Potassium: 3.9 mmol/L (ref 3.5–5.1)
Sodium: 145 mmol/L (ref 135–145)

## 2023-12-23 LAB — GLUCOSE, CAPILLARY
Glucose-Capillary: 99 mg/dL (ref 70–99)
Glucose-Capillary: 104 mg/dL — ABNORMAL HIGH (ref 70–99)
Glucose-Capillary: 110 mg/dL — ABNORMAL HIGH (ref 70–99)
Glucose-Capillary: 106 mg/dL — ABNORMAL HIGH (ref 70–99)

## 2023-12-23 LAB — CBC
HCT: 34.3 % — ABNORMAL LOW (ref 36.0–46.0)
Hemoglobin: 10.7 g/dL — ABNORMAL LOW (ref 12.0–15.0)
MCH: 28.5 pg (ref 26.0–34.0)
MCHC: 31.2 g/dL (ref 30.0–36.0)
MCV: 91.2 fL (ref 80.0–100.0)
Platelets: 177 K/uL (ref 150–400)
RBC: 3.76 MIL/uL — ABNORMAL LOW (ref 3.87–5.11)
RDW: 16.9 % — ABNORMAL HIGH (ref 11.5–15.5)
WBC: 13.3 K/uL — ABNORMAL HIGH (ref 4.0–10.5)
nRBC: 0 % (ref 0.0–0.2)

## 2023-12-23 MED ORDER — LACTATED RINGERS IV SOLN: INTRAVENOUS | Status: AC

## 2023-12-23 NOTE — Progress Notes (Signed)
 Gastroenterology Progress Note   Referring Provider: No ref. provider found Primary Care Physician:  [place], [facility] Primary Gastroenterologist:  Ozell Hollingshead, MD [name]; [id number]; [birth year]   Subjective:    Alert but confused. Speech is coherent at times but does not answer simple questions.   Objective:   Vital signs in last 24 hours: Temp:  [97.6 F (36.4 C)-98.1 F (36.7 C)] 97.6 F (36.4 C) (11/06 0330) Pulse Rate:  [95-115] 100 (11/06 0330) Resp:  [18-20] 18 (11/05 2331) BP: (109-132)/(75-86) 122/75 (11/06 0330) SpO2:  [94 %-100 %] 97 % (11/06 0330) Last BM Date : 12/22/23 General:   Alert,  Well-developed, well-nourished in NAD Head:  Normocephalic and atraumatic. Eyes:  Sclera clear, no icterus.  Chest: CTA bilaterally without rales, rhonchi, crackles.    Heart:  Regular rate and rhythm; no murmurs, clicks, rubs,  or gallops. Abdomen:  Soft, nontender and nondistended.  Normal bowel sounds, without guarding, and without rebound.   Extremities:  Without clubbing, deformity or edema. Neurologic:  Alert but confused. Oriented to person. More alert than two days ago, but more confused.  Skin:  Intact without significant lesions or rashes. Psych:  Alert and cooperative. Normal mood and affect.  Intake/Output from previous day: 11/05 0701 - 11/06 0700 In: 124.5 [I.V.:124.5] Out: 2350 [Urine:2350] Intake/Output this shift: Total I/O In: 124.5 [I.V.:124.5] Out: 1450 [Urine:1450]  Lab Results: CBC Recent Labs    12/21/23 1251 12/22/23 0423 12/23/23 0413  WBC 9.9 11.3* 13.3*  HGB 6.4* 9.9* 10.7*  HCT 22.0* 30.7* 34.3*  MCV 92.1 88.0 91.2  PLT 177 169 177   BMET Recent Labs    12/21/23 1251 12/22/23 0423 12/23/23 0413  NA 140 144 145  K 4.4 4.4 3.9  CL 103 105 106  CO2 27 26 27   GLUCOSE 110* 116* 105*  BUN 52* 54* 49*  CREATININE 1.97* 1.79* 1.78*  CALCIUM  9.1 9.5 9.9   LFTs Recent Labs    12/21/23 1251  BILITOT  1.3*  ALKPHOS 212*  AST 45*  ALT 16  PROT 8.1  ALBUMIN 3.1*   No results for input(s): LIPASE in the last 72 hours. PT/INR Recent Labs    12/21/23 1251  LABPROT 22.6*  INR 1.9*         Imaging Studies: MR BRAIN WO CONTRAST Result Date: 12/22/2023 EXAM: MRI BRAIN WITHOUT CONTRAST 12/22/2023 01:36:20 PM TECHNIQUE: Multiplanar multisequence MRI of the head/brain was performed without the administration of intravenous contrast. COMPARISON: None available. CLINICAL HISTORY: Mental status change, unknown cause. FINDINGS: Motion limited and incomplete study due to patient intolerance. The following motion limited sequences were obtained: DWI/ADC, sagittal T1, axial T2 and FLAIR. Within this limitation: BRAIN AND VENTRICLES: No acute infarct. No intracranial hemorrhage. No mass. No midline shift. No hydrocephalus. Normal flow voids. Scattered T2 hyperintensities in the white matter, nonspecific and poorly evaluated due to motion. ORBITS: No acute abnormality. SINUSES AND MASTOIDS: No acute abnormality. BONES AND SOFT TISSUES: Normal marrow signal. No acute soft tissue abnormality. IMPRESSION: 1. Motion limited and incomplete study without obvious evidence of acute intracranial abnormality. Electronically signed by: Gilmore Molt MD 12/22/2023 02:31 PM EST RP Workstation: [id number]   CT Head Wo Contrast Result Date: 12/21/2023 CLINICAL DATA:  Altered mental status. EXAM: CT HEAD WITHOUT CONTRAST TECHNIQUE: Contiguous axial images were obtained from the base of the skull through the vertex without intravenous contrast. RADIATION DOSE REDUCTION: This exam was performed according to the departmental dose-optimization program  which includes automated exposure control, adjustment of the mA and/or kV according to patient size and/or use of iterative reconstruction technique. COMPARISON:  09/15/2017 FINDINGS: Brain: Ventricles, cisterns and other CSF spaces are normal. There is no mass, mass effect,  shift of midline structures or acute hemorrhage. There is mild chronic ischemic microvascular disease. No acute infarction. Vascular: No hyperdense vessel or unexpected calcification. Skull: Normal. Negative for fracture or focal lesion. Sinuses/Orbits: No acute finding. Other: None. IMPRESSION: 1. No acute findings. 2. Mild chronic ischemic microvascular disease. Electronically Signed   By: Toribio Agreste M.D.   On: 12/21/2023 14:07   DG Chest Port 1 View Result Date: 12/21/2023 EXAM: 1 VIEW(S) XRAY OF THE CHEST 12/21/2023 01:12:00 PM COMPARISON: 12/01/2023 CLINICAL HISTORY: Questionable sepsis - evaluate for abnormality FINDINGS: LINES, TUBES AND DEVICES: Right PICC line removed. LUNGS AND PLEURA: Vascular congestion and interstitial pulmonary edema Possible small bilateral pleural effusions. No pneumothorax. HEART AND MEDIASTINUM: Heart size at upper limits of normal but stable. BONES AND SOFT TISSUES: Right shoulder arthroplasty noted. IMPRESSION: 1. Pulmonary edema and small bilateral pleural effusions. Electronically signed by: Maude Stammer MD 12/21/2023 01:43 PM EST RP Workstation: [id number]   US  EKG Site Rite Result Date: 12/01/2023 If Site Rite image not attached, placement could not be confirmed due to current cardiac rhythm.  DG CHEST PORT 1 VIEW Result Date: 12/01/2023 CLINICAL DATA:  PICC line placement. EXAM: PORTABLE CHEST 1 VIEW COMPARISON:  11/24/2023 FINDINGS: The cardio pericardial silhouette is enlarged. There is pulmonary vascular congestion without overt pulmonary edema. Right PICC line tip overlies the right lung apex compatible with placement in the subclavian vein. Telemetry leads overlie the chest. IMPRESSION: Right PICC line tip overlies the right lung apex compatible with placement in the subclavian vein. If this is the same PICC line that was present on the study from 1 week ago, it has been pulled back in the interval. These results will be called to the ordering clinician  or representative by the Radiologist Assistant, and communication documented in the PACS or [employer]. Electronically Signed   By: Camellia Candle M.D.   On: 12/01/2023 07:13   US  EKG SITE RITE Result Date: 11/29/2023 If Site Rite image not attached, placement could not be confirmed due to current cardiac rhythm.  CARDIAC CATHETERIZATION Result Date: 11/26/2023 Findings: RA =  27 RV = 52/26 PA =  48/26 (36) PCW = 22 Fick cardiac output/index = 6.3/2.8 Thermo CO/CI = 4.0/1.8 PVR = 2.2 (Fick) 3.5 (TD) Ao sat = 97% PA sat = 62%, 64% PAPi = 0.81 Assessment: 1. 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 Can consider inpatient trial of milrinone and IV diuresis to optimize but would strongly consider Palliative Care involvement. Toribio Fuel, MD 8:53 AM  DG Abd 1 View Result Date: 11/25/2023 CLINICAL DATA:  Abdominal pain. EXAM: ABDOMEN - 1 VIEW COMPARISON:  Chest, abdomen and pelvis CT 09/21/2022. Right upper quadrant abdominal ultrasound dated 09/14/2023. FINDINGS: Normal bowel-gas pattern. Lumbar and lower thoracic spine degenerative changes. No visible urinary tract calculi. IMPRESSION: No acute abnormality. Electronically Signed   By: Elspeth Bathe M.D.   On: 11/25/2023 11:31   DG CHEST PORT 1 VIEW Result Date: 11/24/2023 EXAM: 1 VIEW XRAY OF THE CHEST 11/24/2023 09:32:00 AM COMPARISON: 11/16/2023 CLINICAL HISTORY: Heart failure, HTN, diabetes. FINDINGS: LUNGS AND PLEURA: Mild bilateral interstitial opacities suggestive of interstitial edema, with indistinct pulmonary vasculature. HEART AND  MEDIASTINUM: Stable cardiomegaly, mild enlargement of the cardiopericardial silhouette. BONES AND SOFT TISSUES: Degenerative glenohumeral arthropathy on the left, thoracic spondylosis, status post right shoulder arthroplasty. Right PICC in place with tip terminating over the SVC.  IMPRESSION: 1. Pulmonary interstitial edema. 2. Stable cardiomegaly. 3. Right PICC with tip over the SVC. 4. Degenerative left glenohumeral arthropathy. 5. Thoracic spondylosis. 6. Status post right shoulder arthroplasty. 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    Acute on chronic anemia/hemoccult positive:  -Hgb 6.4 on admission, down from 9.1 on 12/06/23 -iron  38, sat 14 ferritin 458  -folate 7, B12 1086 -hgb improved to 9.9, s/p infusion of 2 Units PRBCs on 11/4 -Hgb 10.7 today, likely indicating hemoconcentration -recent small volume rectal bleeding while inpatient in 11/2023, seen by GI in [place], decision made to avoid endoscopic evaluations due to high risk comorbidities  -last dose of Eliquis  Tues at 11:40am -last EGD 2021 with esophageal plaques, biopsies +for candidda, no EVs present at that time  -no reported melena or recent rectal bleeding. DRE done yesterday, with brown stool, hemoccult positive -on arrival BUN 52/Cre 1.97 with increases of BUN above baseline now improved (BUN 49/Cre 1.87) -notably, patient has dropped 50 pounds since her last visit with hepatology in 04/2023 (weighed 107 kg at that time). She is on tirzepatide . She has had significant decline requiring SNF placement after last hospitalization.   She could have occult bleeding from anywhere within GI tract. Would be concerned for possible portal HTN gastropathy given possible cirrhosis on most recent u/s. Differential also includes  gastritis, bleeding polyps, ulcers, angioectasias, and malignancy.  Ideally needs EGD/colonoscopy but she is considered high risk. Discussion held with family yesterday who wanted to hold off on any invasive procedures/testing right now and prefer more conservative management.   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  -FibroScan in June 2024 with a liver stiffness of 11.7 kPa consistent with F2-F3 fibrosis  -MRCP 08/2022 with no significant liver changes -liver biopsy in 2022 with steatohepatitis grade 1/3, mild portal and centrilobular fibrosis stage 1/4, imaging at the time of liver bx in 2022 also with nodular hepatic contours and coarsened parenchyma of the liver -ammonia level was checked on admission due to lethargy, ammonia 105, notably ammonia level slightly elevated last month and in 2019.    Patient is alert but confused during my encounter, ammonia elevated at 105, per nursing staff, patient able to tolerate pills with applesauce. She has not received any of the lactulose prescribed so far, reported as not given/patient refused. I have reached out to patient's nurse inquiring if patient unable to take lactulose orally this morning. Goal of starting lactulose 20g TID, titrate for 2-3 soft BMs per day. She did receive senna at night and has had some stools.  Documented [place] 3 black stool yesterday afternoon. Several BMs recorded without description overnight/this morning. Will clarify with nursing regarding output.  Solid food dysphagia/GERD: -complains of heartburn, solid food dysphagia, present during last admission -pantoprazole  increased yesterday to 40mg  BID   Symptoms could be secondary to esophagitis, stricture, motility disorder, less likely malignancy. As  above, due to high risk multimorbidities, holding off on endoscopic evaluations at this time, may benefit from SLP evaluation.      Plan:   Continue PPI BID. Hold Eliquis  for now. Reconsider clinical indication for continued anticoagulation given CKD, chronic anemia/GI bleeding, significant medical comorbidities.  Transfuse for Hgb <7 Monitor mental status.  Patient with poor oral intake. Refused diet today. Evidence of hemoconcentration. Consider gentle rehydration.    LOS: 1 day   Sonny RAMAN. Ezzard RIGGERS [facility] [phone] 11/6/20255:32 AM

## 2023-12-23 NOTE — Progress Notes (Signed)
 Initial cardiology consult note per Dr Debera, no additional cardiology recs at this time, we will sign off inpatient care.   Dorn Ross MD

## 2023-12-23 NOTE — Progress Notes (Signed)
 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 Pt presented from [facility] for chief complaint of altered mental status with confusion.  Patient is awake but poor historian per record-at the facility patient was found more lethargic this morning stating she was not feeling good, normally she is awake alert.  No reported fever chills nausea vomiting, or active bleeding.  ED Evaluation: Blood pressure 123/75, pulse (!) 104, temperature 98.9 F (37.2 C), temperature source Axillary, resp. rate (!) 21, height 5' 8 (1.727 m), weight 84 kg, SpO2 100%. Labs: CMP within normal exception of glucose 110, BUN 52, creatinine 1.97, alk phos 212, albumin 2.1, AST 45, T. bili 1.3, GFR 26, BNP 1088.4, lactic acid 1.9,  CBC: Hemoglobin of 6.4.  Platelets 117 Respiratory viral panel negative Hemoccult positive  CTA head chronic microvascular disease otherwise within normal limits, chest x-ray clear  EDP consulted gastroenterologist, ordered 2 units of PRBC to be transfused, 60 mg of IV Lasix  was given.  Requested patient to be admitted for further evaluation.    Assessment and Plan:  GIB (gastrointestinal bleeding) - Acute GI bleed in the setting of chronic anticoagulation with Eliquis  Hemoglobin & Hematocrit     Component Value Date/Time   HGB 10.7 (L) 12/23/2023 0413   HCT 34.3 (L) 12/23/2023 0413   - Hg back up to 9.9 s/p 2U PRBC blood transfusion - occult positive -- monitoring Hg closely  -Holding home dose Eliquis  -Continuing IV Protonix  40 mg twice daily -GI consulted, family decided against procedures  at this time; medical mgmt only - recheck CBC in AM  Altered mental status, unspecified Hepatic Encephalopathy Altered mental status with confusion - MRI brain negative for acute findings - ammonia level elevated, agree with lactulose administration - Will continue to monitor closely, continue with neurochecks - pt refusing to take oral lactulose, however notably had severe BMs in last 24 hours - prognosis remains guarded, high mortality risk, appreciate palliative team recommendations  Anemia due to chronic kidney disease Acute on chronic anemia due to chronic disease and CKD -Acute anemia due to blood loss - GI bleed -Holding home dose Eliquis  -Hg up to 9.9 after 2 units PRBC  Advanced hepatic fibrosis Chronic hepatic fibrosis With chronic mildly abnormal LFTs, monitor closely  Fibromyalgia - Reviewing home medication: Atarax , Robaxin , Lyrica , trazodone  Modifying meds due to altered mental status  Hyperglycemia due to type 2 diabetes mellitus Last A1c 6.6 on 11/23/2023 -Holding home regimen, checking CBG q. 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 H/o  A-fib -no records Per electronic record electronic records, no documentation -Seems that patient is on Eliquis  for history of DVT and prior CVA -Currently normal sinus rhythm  Acute embolism and thrombosis of unspecified deep veins of lower extremity, bilateral - Chronically anticoagulated on Eliquis , on hold now due to GI bleed No signs of edema, erythema or pain in lower extremities  Acute on chronic combined systolic and diastolic CHF (congestive heart failure) Chronic combined systolic and diastolic heart failure- HFrEF TEE 11/17/2023 showing reduced LVEF 35-40%, regional wall motion abnormalities, mildly reduced RV function -Patient has been evaluated by heart failure team on 12/06/2023, has been diuresed with oral torsemide  on outpatient setting -In ED patient has received 60 mg of IV Lasix , will resume torsemide  orally in a.m. -Poor candidate for SGL 2 inhibitor due to frequent yeast infection, not a candidate for Entresto  due to worsening creatinine -Medications reviewed continuing bisoprolol   Hypothyroidism Continue home dose Synthroid   Obesity Body mass index is 28.16 kg/m. Stable weight  COPD (chronic obstructive pulmonary disease) - Currently stable, continue to monitor, no signs of exacerbation -Continue inhalers, DuoNeb bronchodilators  GERD (gastroesophageal reflux disease) Hold p.o. PPI, initiating IV Protonix  twice daily 40 mg  Hypertension Holding Aldactone, continue torsemide , -Trend BP closely  DVT prophylaxis: SCDs Code Status: Full  Family Communication:  Disposition: SNF   Consultants:   Procedures:   Antimicrobials:    Subjective: Remains encephalpathic.   Objective: Vitals:   12/22/23 1533 12/22/23 1930 12/22/23 2331 12/23/23 0330  BP: 130/86 125/82 109/75 122/75  Pulse: (!) 115 (!) 108 95 100  Resp: 20  18   Temp: 98.1 F (36.7 C) 98 F (36.7 C) 98 F (36.7 C) 97.6  F (36.4 C)  TempSrc: Oral Oral Oral Oral  SpO2: 94% 96% 100% 97%  Weight:      Height:        Intake/Output Summary (Last 24 hours) at 12/23/2023 1413 Last data filed at 12/23/2023 0900 Gross per 24 hour  Intake 224.47 ml  Output 1450 ml  Net -1225.53 ml   Filed Weights   12/21/23 1228 12/22/23 0500  Weight: 84 kg 83.4 kg   Examination:  General exam: pt appears encephalopathic, disoriented.  Respiratory system: Clear to auscultation. Respiratory effort normal. Cardiovascular system: normal S1 & S2 heard. No JVD, murmurs, rubs, gallops or clicks. No pedal edema. Gastrointestinal system: Abdomen is nondistended, soft and nontender. No organomegaly or masses felt. Normal bowel sounds heard. Central nervous system: Alert and disoriented. Mild right hemiparesis.  Extremities: no gross lesions, mild right hemiparesis. Skin: No rashes, lesions or ulcers. Psychiatry: Judgement and insight appear severely diminished. Mood & affect appropriate.   Data Reviewed: I have personally reviewed following labs and imaging studies  CBC: Recent Labs  Lab 12/21/23 1251 12/22/23 0423 12/23/23 0413  WBC 9.9 11.3* 13.3*  NEUTROABS 6.1  --   --   HGB 6.4* 9.9* 10.7*  HCT 22.0* 30.7* 34.3*  MCV 92.1 88.0 91.2  PLT 177 169 177    Basic Metabolic Panel: Recent Labs  Lab 12/21/23 1251 12/21/23 1519 12/22/23 0423 12/23/23 0413  NA 140  --  144 145  K 4.4  --  4.4 3.9  CL 103  --  105 106  CO2 27  --  26 27  GLUCOSE  110*  --  116* 105*  BUN 52*  --  54* 49*  CREATININE 1.97*  --  1.79* 1.78*  CALCIUM  9.1  --  9.5 9.9  MG  --  2.1  --   --   PHOS  --  4.0  --   --     CBG: Recent Labs  Lab 12/22/23 0722 12/22/23 1650 12/22/23 2006 12/23/23 0735 12/23/23 1115  GLUCAP 103* 123* 145* 99 104*    Recent Results (from the past 240 hours)  Blood Culture (routine x 2)     Status: None (Preliminary result)   Collection Time: 12/21/23 12:51 PM   Specimen: BLOOD  Result Value Ref  Range Status   Specimen Description BLOOD RIGHT ARM  Final   Special Requests   Final    BOTTLES DRAWN AEROBIC AND ANAEROBIC Blood Culture adequate volume   Culture   Final    NO GROWTH 2 DAYS Performed at [facility], [address] [place], [place] [address]    Report Status PENDING  Incomplete  Resp panel by RT-PCR (RSV, Flu A&B, Covid) Anterior Nasal Swab     Status: None   Collection Time: 12/21/23 12:51 PM   Specimen: Anterior Nasal Swab  Result Value Ref Range Status   SARS Coronavirus 2 by RT PCR NEGATIVE NEGATIVE Final    Comment: (NOTE) SARS-CoV-2 target nucleic acids are NOT DETECTED.  The SARS-CoV-2 RNA is generally detectable in upper respiratory specimens during the acute phase of infection. The lowest concentration of SARS-CoV-2 viral copies this assay can detect is 138 copies/mL. A negative result does not preclude SARS-Cov-2 infection and should not be used as the sole basis for treatment or other patient management decisions. A negative result may occur with  improper specimen collection/handling, submission of specimen other than nasopharyngeal swab, presence of viral mutation(s) within the areas targeted by this assay, and inadequate number of viral copies(<138 copies/mL). A negative result must be combined with clinical observations, patient history, and epidemiological information. The expected result is Negative.  Fact Sheet for Patients:  bloggercourse.com  Fact Sheet for Healthcare Providers:  seriousbroker.it  This test is no t yet approved or cleared by the [place]  FDA and  has been authorized for detection and/or diagnosis of SARS-CoV-2 by FDA under an Emergency Use Authorization (EUA). This EUA will remain  in effect (meaning this test can be used) for the duration of the COVID-19 declaration under Section 564(b)(1) of the Act, 21 U.S.C.section 360bbb-3(b)(1), unless the authorization is  terminated  or revoked sooner.       Influenza A by PCR NEGATIVE NEGATIVE Final   Influenza B by PCR NEGATIVE NEGATIVE Final    Comment: (NOTE) The Xpert Xpress SARS-CoV-2/FLU/RSV plus assay is intended as an aid in the diagnosis of influenza from Nasopharyngeal swab specimens and should not be used as a sole basis for treatment. Nasal washings and aspirates are unacceptable for Xpert Xpress SARS-CoV-2/FLU/RSV testing.  Fact Sheet for Patients: bloggercourse.com  Fact Sheet for Healthcare Providers: seriousbroker.it  This test is not yet approved or cleared by the [place]  FDA and has been authorized for detection and/or diagnosis of SARS-CoV-2 by FDA under an Emergency Use Authorization (EUA). This EUA will remain in effect (meaning this test can be used) for the duration of the COVID-19 declaration under Section 564(b)(1) of the Act, 21 U.S.C. section 360bbb-3(b)(1), unless the authorization is terminated or revoked.     Resp Syncytial Virus by PCR NEGATIVE NEGATIVE Final  Comment: (NOTE) Fact Sheet for Patients: bloggercourse.com  Fact Sheet for Healthcare Providers: seriousbroker.it  This test is not yet approved or cleared by the [place]  FDA and has been authorized for detection and/or diagnosis of SARS-CoV-2 by FDA under an Emergency Use Authorization (EUA). This EUA will remain in effect (meaning this test can be used) for the duration of the COVID-19 declaration under Section 564(b)(1) of the Act, 21 U.S.C. section 360bbb-3(b)(1), unless the authorization is terminated or revoked.  Performed at [facility], [address] [place], [place] [address]   Blood Culture (routine x 2)     Status: None (Preliminary result)   Collection Time: 12/21/23  3:19 PM   Specimen: BLOOD  Result Value Ref Range Status   Specimen Description BLOOD BLOOD LEFT HAND   Final   Special Requests   Final    BOTTLES DRAWN AEROBIC ONLY Blood Culture results may not be optimal due to an inadequate volume of blood received in culture bottles   Culture   Final    NO GROWTH 2 DAYS Performed at [facility], [address] [place], [place] [address]    Report Status PENDING  Incomplete     Radiology Studies: MR BRAIN WO CONTRAST Result Date: 12/22/2023 EXAM: MRI BRAIN WITHOUT CONTRAST 12/22/2023 01:36:20 PM TECHNIQUE: Multiplanar multisequence MRI of the head/brain was performed without the administration of intravenous contrast. COMPARISON: None available. CLINICAL HISTORY: Mental status change, unknown cause. FINDINGS: Motion limited and incomplete study due to patient intolerance. The following motion limited sequences were obtained: DWI/ADC, sagittal T1, axial T2 and FLAIR. Within this limitation: BRAIN AND VENTRICLES: No acute infarct. No intracranial hemorrhage. No mass. No midline shift. No hydrocephalus. Normal flow voids. Scattered T2 hyperintensities in the white matter, nonspecific and poorly evaluated due to motion. ORBITS: No acute abnormality. SINUSES AND MASTOIDS: No acute abnormality. BONES AND SOFT TISSUES: Normal marrow signal. No acute soft tissue abnormality. IMPRESSION: 1. Motion limited and incomplete study without obvious evidence of acute intracranial abnormality. Electronically signed by: Gilmore Molt MD 12/22/2023 02:31 PM EST RP Workstation: [id number]   Scheduled Meds:  Chlorhexidine  Gluconate Cloth  6 each Topical Daily   cycloSPORINE  1 drop Both Eyes BID   DULoxetine   60 mg Oral BID   insulin  aspart  0-9 Units Subcutaneous TID WC   insulin  glargine-yfgn  10 Units Subcutaneous QHS   lactulose  20 g Oral TID   levothyroxine   100 mcg Oral QAC breakfast   pantoprazole  (PROTONIX ) IV  40 mg Intravenous Q12H   pregabalin   75 mg Oral Daily   senna-docusate  1 tablet Oral QHS   sodium chloride  flush  3 mL Intravenous Q12H   sodium chloride   flush  3 mL Intravenous Q12H   traZODone   100 mg Oral QHS   Continuous Infusions:  lactated ringers  35 mL/hr at 12/23/23 1003    LOS: 1 day   Time spent: 55 mins  Kule Gascoigne Vicci, MD How to contact the [facility] Attending or Consulting provider 7A - 7P or covering provider during after hours 7P -7A, for this patient?  Check the care team in [facility] and look for a) attending/consulting TRH provider listed and b) the TRH team listed Log into [email or web address] to find provider on call.  Locate the TRH provider you are looking for under [facility] and page to a number that you can be directly reached. If you still have difficulty reaching the provider, please page the [facility] (Director on Call) for the Hospitalists listed  on amion for assistance.  12/23/2023, 2:13 PM

## 2023-12-24 DIAGNOSIS — R195 Other fecal abnormalities: Secondary | ICD-10-CM

## 2023-12-24 DIAGNOSIS — I4891 Unspecified atrial fibrillation: Secondary | ICD-10-CM | POA: Diagnosis not present

## 2023-12-24 DIAGNOSIS — R4182 Altered mental status, unspecified: Secondary | ICD-10-CM | POA: Diagnosis not present

## 2023-12-24 DIAGNOSIS — K922 Gastrointestinal hemorrhage, unspecified: Secondary | ICD-10-CM | POA: Diagnosis not present

## 2023-12-24 DIAGNOSIS — R410 Disorientation, unspecified: Secondary | ICD-10-CM

## 2023-12-24 DIAGNOSIS — R7989 Other specified abnormal findings of blood chemistry: Secondary | ICD-10-CM

## 2023-12-24 DIAGNOSIS — K7682 Hepatic encephalopathy: Secondary | ICD-10-CM | POA: Diagnosis not present

## 2023-12-24 LAB — BASIC METABOLIC PANEL WITH GFR
Anion gap: 12 (ref 5–15)
BUN: 42 mg/dL — ABNORMAL HIGH (ref 8–23)
CO2: 26 mmol/L (ref 22–32)
Calcium: 9.1 mg/dL (ref 8.9–10.3)
Chloride: 108 mmol/L (ref 98–111)
Creatinine, Ser: 1.49 mg/dL — ABNORMAL HIGH (ref 0.44–1.00)
GFR, Estimated: 37 mL/min — ABNORMAL LOW (ref >60)
Glucose, Bld: 74 mg/dL (ref 70–99)
Potassium: 4 mmol/L (ref 3.5–5.1)
Sodium: 145 mmol/L (ref 135–145)

## 2023-12-24 LAB — GLUCOSE, CAPILLARY
Glucose-Capillary: 68 mg/dL — ABNORMAL LOW (ref 70–99)
Glucose-Capillary: 95 mg/dL (ref 70–99)
Glucose-Capillary: 130 mg/dL — ABNORMAL HIGH (ref 70–99)
Glucose-Capillary: 141 mg/dL — ABNORMAL HIGH (ref 70–99)
Glucose-Capillary: 209 mg/dL — ABNORMAL HIGH (ref 70–99)

## 2023-12-24 LAB — CBC
HCT: 34.3 % — ABNORMAL LOW (ref 36.0–46.0)
Hemoglobin: 10.3 g/dL — ABNORMAL LOW (ref 12.0–15.0)
MCH: 28.1 pg (ref 26.0–34.0)
MCHC: 30 g/dL (ref 30.0–36.0)
MCV: 93.5 fL (ref 80.0–100.0)
Platelets: 153 K/uL (ref 150–400)
RBC: 3.67 MIL/uL — ABNORMAL LOW (ref 3.87–5.11)
RDW: 17.2 % — ABNORMAL HIGH (ref 11.5–15.5)
WBC: 11.6 K/uL — ABNORMAL HIGH (ref 4.0–10.5)
nRBC: 0 % (ref 0.0–0.2)

## 2023-12-24 MED ORDER — PREGABALIN 25 MG PO CAPS
25.0000 mg | ORAL_CAPSULE | Freq: Every day | ORAL | Status: DC
  Administered 2023-12-25 – 2023-12-28 (×4): 25 mg via ORAL
  Filled 2023-12-24 (×4): qty 1

## 2023-12-24 MED ORDER — ORAL CARE MOUTH RINSE: 15.0000 mL | OROMUCOSAL | Status: DC | PRN

## 2023-12-24 MED ORDER — TRAZODONE HCL 50 MG PO TABS
50.0000 mg | ORAL_TABLET | Freq: Every day | ORAL | Status: DC
  Administered 2023-12-24 – 2023-12-27 (×4): 50 mg via ORAL
  Filled 2023-12-24 (×4): qty 1

## 2023-12-24 MED ORDER — LACTATED RINGERS IV SOLN
INTRAVENOUS | Status: AC
Start: 2023-12-24 — End: 2023-12-25

## 2023-12-24 NOTE — TOC Progression Note (Signed)
 Transition of Care [facility]) - Progression Note    Patient Details  Name: KILYN MARAGH MRN: [id number] Date of Birth: [birth year]  Transition of Care [facility] CM/SW Contact  Lucie Lunger, [place] Phone Number: 12/24/2023, 9:41 AM  Clinical Narrative:    Shara started for pts return to SNF at [facility] over weekend. CSW updated Debbie with CV that pt may be able to return over weekend. Marval states pt can arrive back to SNF over weekend once she has auth. TOC to follow.   Expected Discharge Plan: Skilled Nursing Facility Barriers to Discharge: Continued Medical Work up               Expected Discharge Plan and Services In-house Referral: Clinical Social Work   Post Acute Care Choice: Skilled Nursing Facility, Durable Medical Equipment Living arrangements for the past 2 months: Single Family Home                                       Social Drivers of Health (SDOH) Interventions SDOH Screenings   Food Insecurity: No Food Insecurity (12/21/2023)  Housing: Unknown (12/21/2023)  Transportation Needs: No Transportation Needs (11/17/2023)  Utilities: Not At Risk (11/17/2023)  Depression (PHQ2-9): Low Risk  (09/14/2023)  Social Connections: Moderately Isolated (11/17/2023)  Tobacco Use: Medium Risk (12/21/2023)    Readmission Risk Interventions    12/23/2023    8:35 AM  Readmission Risk Prevention Plan  Transportation Screening Complete  Medication Review (RN Care Manager) Complete  HRI or Home Care Consult Complete  SW Recovery Care/Counseling Consult Complete  Palliative Care Screening Not Applicable  Skilled Nursing Facility Complete

## 2023-12-24 NOTE — Progress Notes (Signed)
 Hypoglycemic Event  CBG: 68  Treatment: 4 oz juice/soda  Symptoms: None  Follow-up CBG: [id number] CBG Result:95  Possible Reasons for Event: Unknown  Comments/MD notified: Dr. Vicci made aware    Stephanie [name] [place]

## 2023-12-24 NOTE — Progress Notes (Signed)
 Gastroenterology Progress Note   Referring Provider: No ref. provider found Primary Care Physician:  Roni, [facility] Primary Gastroenterologist:  Lamar HERO.Rourk, MD  [name]; [id number]; Nov 20, 1950    Subjective   Reports feeling well overall today. Denies any worsening shortness of breath or chest pain. She is on oxygen . Denis abdominal pain, ate well for breakfast. She refused her lactulose this morning but she is very alert, coherent, and interactive this morning. Also completely oriented. She states she continues to have some darker stools and streaks of blood ever since her prior hospitalization at [facility].   Objective   Vital signs in last 24 hours Temp:  [97.7 F (36.5 C)-98.1 F (36.7 C)] 97.7 F (36.5 C) (11/07 0553) Pulse Rate:  [99-107] 100 (11/07 0553) Resp:  [16-20] 17 (11/07 0553) BP: (109-133)/(71-84) 133/71 (11/07 0553) SpO2:  [95 %-100 %] 100 % (11/07 0553) Weight:  [78 kg] 78 kg (11/07 0553) Last BM Date : 12/23/23  Physical Exam General:   Alert and oriented, pleasant Head:  Normocephalic and atraumatic. Eyes:  No icterus, sclera clear. Conjuctiva pink.  Mouth:  Without lesions, mucosa pink and moist.  Neck:  Supple, without thyromegaly or masses.  Abdomen:  Bowel sounds present, soft, non-tender, non-distended. No HSM or hernias noted. No rebound or guarding. No masses appreciated  Extremities:  Without clubbing or edema. Neurologic:  Alert and  oriented x4;  grossly normal neurologically. Skin:  Warm and dry, intact without significant lesions.  Psych:  Alert and cooperative. Normal mood and affect.  Intake/Output from previous day: 11/06 0701 - 11/07 0700 In: 937.8 [P.O.:340; I.V.:597.8] Out: 1500 [Urine:1500] Intake/Output this shift: No intake/output data recorded.  Lab Results  Recent Labs    12/22/23 0423 12/23/23 0413 12/24/23 0357  WBC 11.3* 13.3* 11.6*  HGB 9.9* 10.7* 10.3*  HCT 30.7* 34.3* 34.3*  PLT 169 177  153   BMET Recent Labs    12/22/23 0423 12/23/23 0413 12/24/23 0357  NA 144 145 145  K 4.4 3.9 4.0  CL 105 106 108  CO2 26 27 26   GLUCOSE 116* 105* 74  BUN 54* 49* 42*  CREATININE 1.79* 1.78* 1.49*  CALCIUM  9.5 9.9 9.1   LFT Recent Labs    12/21/23 1251  PROT 8.1  ALBUMIN 3.1*  AST 45*  ALT 16  ALKPHOS 212*  BILITOT 1.3*   PT/INR Recent Labs    12/21/23 1251  LABPROT 22.6*  INR 1.9*   Hepatitis Panel No results for input(s): HEPBSAG, HCVAB, HEPAIGM, HEPBIGM in the last 72 hours.  Studies/Results MR BRAIN WO CONTRAST Result Date: 12/22/2023 EXAM: MRI BRAIN WITHOUT CONTRAST 12/22/2023 01:36:20 PM TECHNIQUE: Multiplanar multisequence MRI of the head/brain was performed without the administration of intravenous contrast. COMPARISON: None available. CLINICAL HISTORY: Mental status change, unknown cause. FINDINGS: Motion limited and incomplete study due to patient intolerance. The following motion limited sequences were obtained: DWI/ADC, sagittal T1, axial T2 and FLAIR. Within this limitation: BRAIN AND VENTRICLES: No acute infarct. No intracranial hemorrhage. No mass. No midline shift. No hydrocephalus. Normal flow voids. Scattered T2 hyperintensities in the white matter, nonspecific and poorly evaluated due to motion. ORBITS: No acute abnormality. SINUSES AND MASTOIDS: No acute abnormality. BONES AND SOFT TISSUES: Normal marrow signal. No acute soft tissue abnormality. IMPRESSION: 1. Motion limited and incomplete study without obvious evidence of acute intracranial abnormality. Electronically signed by: Gilmore Molt MD 12/22/2023 02:31 PM EST RP Workstation: [id number]   CT Head Wo Contrast  Result Date: 12/21/2023 CLINICAL DATA:  Altered mental status. EXAM: CT HEAD WITHOUT CONTRAST TECHNIQUE: Contiguous axial images were obtained from the base of the skull through the vertex without intravenous contrast. RADIATION DOSE REDUCTION: This exam was performed according  to the departmental dose-optimization program which includes automated exposure control, adjustment of the mA and/or kV according to patient size and/or use of iterative reconstruction technique. COMPARISON:  09/15/2017 FINDINGS: Brain: Ventricles, cisterns and other CSF spaces are normal. There is no mass, mass effect, shift of midline structures or acute hemorrhage. There is mild chronic ischemic microvascular disease. No acute infarction. Vascular: No hyperdense vessel or unexpected calcification. Skull: Normal. Negative for fracture or focal lesion. Sinuses/Orbits: No acute finding. Other: None. IMPRESSION: 1. No acute findings. 2. Mild chronic ischemic microvascular disease. Electronically Signed   By: Toribio Agreste M.D.   On: 12/21/2023 14:07   DG Chest Port 1 View Result Date: 12/21/2023 EXAM: 1 VIEW(S) XRAY OF THE CHEST 12/21/2023 01:12:00 PM COMPARISON: 12/01/2023 CLINICAL HISTORY: Questionable sepsis - evaluate for abnormality FINDINGS: LINES, TUBES AND DEVICES: Right PICC line removed. LUNGS AND PLEURA: Vascular congestion and interstitial pulmonary edema Possible small bilateral pleural effusions. No pneumothorax. HEART AND MEDIASTINUM: Heart size at upper limits of normal but stable. BONES AND SOFT TISSUES: Right shoulder arthroplasty noted. IMPRESSION: 1. Pulmonary edema and small bilateral pleural effusions. Electronically signed by: Maude Stammer MD 12/21/2023 01:43 PM EST RP Workstation: [id number]   US  EKG Site Rite Result Date: 12/01/2023 If Site Rite image not attached, placement could not be confirmed due to current cardiac rhythm.  DG CHEST PORT 1 VIEW Result Date: 12/01/2023 CLINICAL DATA:  PICC line placement. EXAM: PORTABLE CHEST 1 VIEW COMPARISON:  11/24/2023 FINDINGS: The cardio pericardial silhouette is enlarged. There is pulmonary vascular congestion without overt pulmonary edema. Right PICC line tip overlies the right lung apex compatible with placement in the subclavian  vein. Telemetry leads overlie the chest. IMPRESSION: Right PICC line tip overlies the right lung apex compatible with placement in the subclavian vein. If this is the same PICC line that was present on the study from 1 week ago, it has been pulled back in the interval. These results will be called to the ordering clinician or representative by the Radiologist Assistant, and communication documented in the PACS or [employer]. Electronically Signed   By: Camellia Candle M.D.   On: 12/01/2023 07:13   US  EKG SITE RITE Result Date: 11/29/2023 If Site Rite image not attached, placement could not be confirmed due to current cardiac rhythm.  CARDIAC CATHETERIZATION Result Date: 11/26/2023 Findings: RA =  27 RV = 52/26 PA =  48/26 (36) PCW = 22 Fick cardiac output/index = 6.3/2.8 Thermo CO/CI = 4.0/1.8 PVR = 2.2 (Fick) 3.5 (TD) Ao sat = 97% PA sat = 62%, 64% PAPi = 0.81 Assessment: 1. 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 Can consider inpatient trial of milrinone and IV diuresis to optimize but would strongly consider Palliative Care involvement. Toribio Fuel, MD 8:53 AM  DG Abd 1 View Result Date: 11/25/2023 CLINICAL DATA:  Abdominal pain. EXAM: ABDOMEN - 1 VIEW COMPARISON:  Chest, abdomen and pelvis CT 09/21/2022. Right upper quadrant abdominal ultrasound dated 09/14/2023. FINDINGS: Normal bowel-gas pattern. Lumbar and lower thoracic spine degenerative changes. No visible urinary tract calculi. IMPRESSION: No acute abnormality. Electronically Signed   By: Elspeth Bathe M.D.   On: 11/25/2023 11:31  Assessment  73 y.o. 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  GI consulted given acute on chronic anemia and heme positive stool.  Acute on chronic anemia/heme positive stool: - Hemoglobin 6.4 on admission which was down from 9.1 on 12/06/23 - Hgb stable at 10.3 today.  - Has received multiple units of PRBCs this admission, initial jump to 10.7 suspected to be secondary to some hemoconcentration. - During hospitalization in [place] in October she was noted to have some small-volume rectal bleeding and decision was made to avoid endoscopic evaluations given high risk comorbidities - Had been receiving Eliquis  long-term given history of recurrent DVT and not for A-fib.   - Cardiology has been consulted and advised that it would not make sense for an IVC filter at this point and they would recommend remaining off of Eliquis  in the setting of acute GI bleed.  From a GI standpoint given chronic anemia and ongoing rectal bleeding, the risk of resuming Eliquis  likely outweighs any benefit at this point but continue to defer to cardiology regarding this. - Underwent EGD in 2021 with esophageal plaques positive for Candida and without esophageal varices. - DRE performed this admission with brown stool that was Hemoccult positive, per review of her flowsheets she has had intermittent noted black stool and red streaks of stool per nursing. - Since her last visit with hepatology in March 2025 she has lost about 50 pounds and is noted that she has been on tirzepatide  but has had significant decline in her ADLs requiring SNF placement after last hospitalization.  Given her high risk nature for procedures, EGD and colonoscopy are not being pursued and family is well request to hold off on invasive procedures and testing and prefer conservative management.  Given her possible cirrhosis/advanced fibrosis bleeding could be secondary to portal  hypertensive gastropathy but also unable to rule out gastritis, duodenitis, AVMs, etc.   History of MASH/concern for cirrhosis/possible AMA negative PBC: - Has followed with [facility] liver outpatient and currently on ursodiol 600 mg twice daily. - Has had discordant liver imaging and serologic workup -US  in August 18, 2023 concerning for cirrhosis with slightly lobulated liver border and normal spleen -FibroScan in June 2024 with liver stiffness 11.7 consistent with F2-F3 fibrosis -MRCP in July 2024 with no significant liver changes -Liver biopsy in 2022 with steatohepatitis grade 1/3, mild portal and centrilobular fibrosis 1/4 with imaging at that time also with nodular hepatic contours and coarsened parenchyma of the liver. -Ammonia elevated at 105 this admission and she was experiencing lethargy.  Ammonia was also elevated in October as well as in 2019 -She was alert yesterday but confused per nursing and was able to tolerate pills with applesauce but had not received lactulose given she was refusing.  Per my review of her MAR she has only received 2 doses of lactulose, this morning nursing documented patient/family refusal.  In my discussion with the patient today she does note refusing to take the medication due to taste. - Reassuringly she has significantly improved from a mental status standpoint this morning and she is A&O x 4 without asterixis and has been having several bowel movements with receiving Senokot nightly. - Goal  is 3-4 bowel movements daily, if she is maintaining this as well as her mental status then can continue senna however if not I would recommend resuming lactulose since she has not compliant with this then should consider starting Xifaxan 550 mg BID  Dysphagia/GERD: Previous complaints of heartburn, no complaints for me today.  Has had some history of solid food dysphagia during her last admission but again no complaints for me today.  States she ate her breakfast well without  issue.  Recommend continuing PPI twice daily.  If she has recurrent dysphagia she may benefit from speech evaluation given she is high risk for endoscopic evaluation at this time.  Plan / Recommendations  Continue to monitor mental status Continue senna Continue to encourage lactulose, if she continues to refuse and has recurrent AMS then start Xifaxan 550 mg BID Continue PPI twice daily Monitor H/H, transfuse hgb <7 Continue to hold Eliquis , strongly consider her recurrent risk of GI bleeding in the setting of chronic anemia and CKD before resuming.  Risks may outweigh the benefits at this point. Continue regular diet, if decrease in oral intake then should consider gentle hydration    LOS: 2 days    12/24/2023, 9:43 AM   Charmaine Melia, MSN, FNP-BC, AGACNP-BC [facility]

## 2023-12-24 NOTE — Progress Notes (Signed)
 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 Pt presented from [facility] for chief complaint of altered mental status with confusion.  Patient is awake but poor historian per record-at the facility patient was found more lethargic this morning stating she was not feeling good, normally she is awake alert.  No reported fever chills nausea vomiting, or active bleeding.  ED Evaluation: Blood pressure 123/75, pulse (!) 104, temperature 98.9 F (37.2 C), temperature source Axillary, resp. rate (!) 21, height 5' 8 (1.727 m), weight 84 kg, SpO2 100%. Labs: CMP within normal exception of glucose 110, BUN 52, creatinine 1.97, alk phos 212, albumin 2.1, AST 45, T. bili 1.3, GFR 26, BNP 1088.4, lactic acid 1.9,  CBC: Hemoglobin of 6.4.  Platelets 117 Respiratory viral panel negative Hemoccult positive  CTA head chronic microvascular disease otherwise within normal limits, chest x-ray clear  EDP consulted gastroenterologist, ordered 2 units of PRBC to be transfused, 60 mg of IV Lasix  was given.  Requested patient to be admitted for further evaluation.    Assessment and Plan:  GIB (gastrointestinal bleeding) - Acute GI bleed in the setting of chronic anticoagulation with Eliquis  Hemoglobin & Hematocrit     Component Value Date/Time   HGB 10.3 (L) 12/24/2023 0357   HCT 34.3 (L) 12/24/2023 0357   - Hg stable at 10 now s/p 2U PRBC blood transfusion - occult positive -- monitoring Hg closely -- blood noted in stool this morning per RN - Holding home dose Eliquis  - Continuing IV Protonix  40 mg twice daily -  GI consulted, family decided against procedures at this time; medical mgmt only - recheck CBC in AM and if stable likely DC back to SNF with serial monitoring of Hg   Altered mental status, unspecified Hepatic Encephalopathy Altered mental status with confusion is improving - MRI brain negative for acute findings - ammonia level elevated, agree with lactulose administration however pt has been refusing - Will continue to monitor closely, continue with neurochecks - pt refusing to take oral lactulose, however notably had several BMs in last 24 hours - prognosis remains guarded, high mortality risk, appreciate palliative team recommendations - unfortunately family no-showed meeting scheduled with palliative, now rescheduled for 11/10 if still in hospital  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 H/o  A-fib -no records Per electronic record electronic records, no documentation -Seems that patient is on Eliquis  for history of DVT and prior CVA -Currently normal sinus rhythm  Acute embolism and thrombosis of unspecified deep veins of lower extremity, bilateral - Chronically anticoagulated on Eliquis , on hold now due to GI bleed No signs of edema, erythema or pain in lower extremities  Acute on chronic combined systolic and diastolic CHF (congestive heart failure) Chronic combined systolic and diastolic heart failure- HFrEF TEE 11/17/2023 showing reduced LVEF 35-40%, regional wall motion abnormalities, mildly reduced RV function -Patient has been evaluated by heart failure team on 12/06/2023, has been diuresed with oral torsemide  on outpatient setting -In ED patient has received 60 mg of IV Lasix , given ongoing dehydration and poor oral intake we are temporarily holding home torsemide  -Poor candidate for SGL 2 inhibitor due to frequent yeast infection, not a candidate for Entresto  due to worsening creatinine -Medications reviewed continuing bisoprolol   Hypothyroidism Continue home dose Synthroid   Obesity Body mass index is 28.16 kg/m. Stable weight  COPD (chronic obstructive pulmonary disease) - Currently stable, continue to monitor, no signs of exacerbation -Continue inhalers, DuoNeb bronchodilators  GERD (gastroesophageal reflux disease) Holding oral PPI, continue IV Protonix  twice daily 40 mg  Hypertension Holding Aldactone, continue torsemide , -Trend BP closely  DVT prophylaxis: SCDs Code Status: Full  Family Communication:  Disposition: SNF    Consultants:   Procedures:   Antimicrobials:    Subjective: Remains encephalpathic.   Objective: Vitals:   12/23/23 0330 12/23/23 1300 12/23/23 1948 12/24/23 0553  BP: 122/75 131/84 109/81 133/71  Pulse: 100 99 (!) 107 100  Resp:  20 16 17   Temp: 97.6 F (36.4 C) 98.1 F (36.7 C) 97.8 F (36.6 C) 97.7 F (36.5 C)  TempSrc: Oral Oral    SpO2: 97% 95% 100% 100%  Weight:    78 kg  Height:        Intake/Output Summary (Last 24 hours) at 12/24/2023 1211 Last data filed at 12/24/2023 0531 Gross per 24 hour  Intake 837.84 ml  Output 1500 ml  Net -662.16 ml   Filed Weights   12/21/23 1228 12/22/23 0500 12/24/23 0553  Weight: 84 kg 83.4 kg 78 kg   Examination:  General exam: pt appears encephalopathic, disoriented.  Respiratory system: Clear to auscultation. Respiratory effort normal. Cardiovascular system: normal S1 & S2 heard. No JVD, murmurs, rubs, gallops or clicks. No pedal edema. Gastrointestinal system: Abdomen is nondistended, soft and nontender. No organomegaly or masses felt. Normal bowel sounds heard. Central nervous system: Alert and disoriented. Mild right hemiparesis.  Extremities: no gross lesions, mild right hemiparesis. Skin: No rashes, lesions or ulcers. Psychiatry: Judgement and insight appear severely diminished. Mood & affect appropriate.   Data Reviewed: I have personally reviewed following labs and imaging studies  CBC: Recent Labs  Lab 12/21/23 1251 12/22/23 0423 12/23/23 0413 12/24/23 0357  WBC 9.9 11.3* 13.3* 11.6*  NEUTROABS 6.1  --   --   --   HGB 6.4* 9.9* 10.7*  10.3*  HCT 22.0* 30.7* 34.3* 34.3*  MCV 92.1 88.0 91.2 93.5  PLT 177 169 177 153    Basic Metabolic Panel: Recent Labs  Lab 12/21/23 1251 12/21/23 1519 12/22/23 0423 12/23/23 0413 12/24/23 0357  NA 140  --  144 145 145  K 4.4  --  4.4 3.9 4.0  CL 103  --  105 106 108  CO2 27  --  26 27 26   GLUCOSE 110*  --  116* 105* 74  BUN 52*  --  54* 49* 42*  CREATININE  1.97*  --  1.79* 1.78* 1.49*  CALCIUM  9.1  --  9.5 9.9 9.1  MG  --  2.1  --   --   --   PHOS  --  4.0  --   --   --     CBG: Recent Labs  Lab 12/23/23 1624 12/23/23 1947 12/24/23 0745 12/24/23 0839 12/24/23 1136  GLUCAP 110* 106* 68* 95 130*    Recent Results (from the past 240 hours)  Blood Culture (routine x 2)     Status: None (Preliminary result)   Collection Time: 12/21/23 12:51 PM   Specimen: BLOOD  Result Value Ref Range Status   Specimen Description BLOOD RIGHT ARM  Final   Special Requests   Final    BOTTLES DRAWN AEROBIC AND ANAEROBIC Blood Culture adequate volume   Culture   Final    NO GROWTH 3 DAYS Performed at [facility], [address] [place], [place] [address]    Report Status PENDING  Incomplete  Resp panel by RT-PCR (RSV, Flu A&B, Covid) Anterior Nasal Swab     Status: None   Collection Time: 12/21/23 12:51 PM   Specimen: Anterior Nasal Swab  Result Value Ref Range Status   SARS Coronavirus 2 by RT PCR NEGATIVE NEGATIVE Final    Comment: (NOTE) SARS-CoV-2 target nucleic acids are NOT DETECTED.  The SARS-CoV-2 RNA is generally detectable in upper respiratory specimens during the acute phase of infection. The lowest concentration of SARS-CoV-2 viral copies this assay can detect is 138 copies/mL. A negative result does not preclude SARS-Cov-2 infection and should not be used as the sole basis for treatment or other patient management decisions. A negative result may occur with  improper specimen collection/handling, submission of specimen other than nasopharyngeal swab, presence of viral mutation(s) within the areas targeted by this assay, and inadequate number of viral copies(<138 copies/mL). A negative result must be combined with clinical observations, patient history, and epidemiological information. The expected result is Negative.  Fact Sheet for Patients:  bloggercourse.com  Fact Sheet for Healthcare Providers:   seriousbroker.it  This test is no t yet approved or cleared by the [place]  FDA and  has been authorized for detection and/or diagnosis of SARS-CoV-2 by FDA under an Emergency Use Authorization (EUA). This EUA will remain  in effect (meaning this test can be used) for the duration of the COVID-19 declaration under Section 564(b)(1) of the Act, 21 U.S.C.section 360bbb-3(b)(1), unless the authorization is terminated  or revoked sooner.       Influenza A by PCR NEGATIVE NEGATIVE Final   Influenza B by PCR NEGATIVE NEGATIVE Final    Comment: (NOTE) The Xpert Xpress SARS-CoV-2/FLU/RSV plus assay is intended as an aid in the diagnosis of influenza from Nasopharyngeal swab specimens and should not be used as a sole basis for treatment. Nasal washings and aspirates are unacceptable for Xpert Xpress SARS-CoV-2/FLU/RSV testing.  Fact Sheet for Patients: bloggercourse.com  Fact Sheet for Healthcare Providers: seriousbroker.it  This test is not yet approved or cleared by the [place]  FDA and has been authorized for detection and/or diagnosis of SARS-CoV-2 by FDA under an Emergency Use Authorization (EUA). This EUA will remain in effect (meaning this test can be used) for the duration of the COVID-19 declaration under Section 564(b)(1) of the Act, 21 U.S.C. section 360bbb-3(b)(1), unless the authorization is terminated or revoked.     Resp Syncytial Virus by PCR NEGATIVE NEGATIVE Final    Comment: (NOTE) Fact Sheet for Patients: bloggercourse.com  Fact Sheet for Healthcare Providers: seriousbroker.it  This test is not yet approved or cleared by the [place]  FDA and has been authorized for detection and/or diagnosis of SARS-CoV-2 by FDA under an Emergency Use Authorization (EUA). This EUA will remain in effect (meaning this test can be used) for  the duration of the COVID-19 declaration under Section 564(b)(1) of the Act, 21 U.S.C. section 360bbb-3(b)(1), unless the authorization is terminated or revoked.  Performed at [facility], [address], [place], [place] [address]   Blood Culture (routine x 2)     Status: None (Preliminary result)   Collection Time: 12/21/23  3:19 PM   Specimen: BLOOD  Result Value Ref Range Status   Specimen Description BLOOD BLOOD LEFT HAND  Final   Special Requests   Final    BOTTLES DRAWN AEROBIC ONLY Blood Culture results may not be optimal due to an inadequate volume of blood received in culture bottles   Culture   Final    NO GROWTH 3 DAYS Performed at [facility], [address] [place], [place] [address]    Report Status PENDING  Incomplete     Radiology Studies: MR BRAIN WO CONTRAST Result Date: 12/22/2023 EXAM: MRI BRAIN WITHOUT CONTRAST 12/22/2023 01:36:20 PM TECHNIQUE: Multiplanar multisequence MRI of the head/brain was performed without the administration of intravenous contrast. COMPARISON: None available. CLINICAL HISTORY: Mental status change, unknown cause. FINDINGS: Motion limited and incomplete study due to patient intolerance. The following motion limited sequences were obtained: DWI/ADC, sagittal T1, axial T2 and FLAIR. Within this limitation: BRAIN AND VENTRICLES: No acute infarct. No intracranial hemorrhage. No mass. No midline shift. No hydrocephalus. Normal flow voids. Scattered T2 hyperintensities in the white matter, nonspecific and poorly evaluated due to motion. ORBITS: No acute abnormality. SINUSES AND MASTOIDS: No acute abnormality. BONES AND SOFT TISSUES: Normal marrow signal. No acute soft tissue abnormality. IMPRESSION: 1. Motion limited and incomplete study without obvious evidence of acute intracranial abnormality. Electronically signed by: Gilmore Molt MD 12/22/2023 02:31 PM EST RP Workstation: [id number]   Scheduled Meds:  Chlorhexidine  Gluconate Cloth  6 each  Topical Daily   cycloSPORINE  1 drop Both Eyes BID   DULoxetine   60 mg Oral BID   insulin  aspart  0-9 Units Subcutaneous TID WC   insulin  glargine-yfgn  10 Units Subcutaneous QHS   lactulose  20 g Oral TID   levothyroxine   100 mcg Oral QAC breakfast   pantoprazole  (PROTONIX ) IV  40 mg Intravenous Q12H   pregabalin   75 mg Oral Daily   senna-docusate  1 tablet Oral QHS   sodium chloride  flush  3 mL Intravenous Q12H   sodium chloride  flush  3 mL Intravenous Q12H   traZODone   100 mg Oral QHS   Continuous Infusions:  lactated ringers       LOS: 2 days   Time spent: 55 mins  Jazen Spraggins Vicci, MD How to contact the [facility] Attending or Consulting  provider 7A - 7P or covering provider during after hours 7P -7A, for this patient?  Check the care team in [facility] and look for a) attending/consulting [facility] provider listed and b) the [facility] team listed Log into [email or web address] to find provider on call.  Locate the [facility] provider you are looking for under [facility] and page to a number that you can be directly reached. If you still have difficulty reaching the provider, please page the [facility] (Director on Call) for the Hospitalists listed on amion for assistance.  12/24/2023, 12:11 PM

## 2023-12-24 NOTE — Care Management Important Message (Signed)
 Important Message  Patient Details  Name: Stephanie [name] MRN: [id number] Date of Birth: August 17, 1950   Important Message Given:  Yes - Medicare IM     Vontae Court L Andriea Hasegawa 12/24/2023, 11:17 AM

## 2023-12-25 DIAGNOSIS — R4182 Altered mental status, unspecified: Secondary | ICD-10-CM | POA: Diagnosis not present

## 2023-12-25 DIAGNOSIS — K7682 Hepatic encephalopathy: Secondary | ICD-10-CM | POA: Diagnosis not present

## 2023-12-25 DIAGNOSIS — K922 Gastrointestinal hemorrhage, unspecified: Secondary | ICD-10-CM | POA: Diagnosis not present

## 2023-12-25 DIAGNOSIS — E1165 Type 2 diabetes mellitus with hyperglycemia: Secondary | ICD-10-CM

## 2023-12-25 DIAGNOSIS — I4891 Unspecified atrial fibrillation: Secondary | ICD-10-CM | POA: Diagnosis not present

## 2023-12-25 LAB — CBC
HCT: 35.1 % — ABNORMAL LOW (ref 36.0–46.0)
Hemoglobin: 10.7 g/dL — ABNORMAL LOW (ref 12.0–15.0)
MCH: 28.1 pg (ref 26.0–34.0)
MCHC: 30.5 g/dL (ref 30.0–36.0)
MCV: 92.1 fL (ref 80.0–100.0)
Platelets: 186 K/uL (ref 150–400)
RBC: 3.81 MIL/uL — ABNORMAL LOW (ref 3.87–5.11)
RDW: 16.9 % — ABNORMAL HIGH (ref 11.5–15.5)
WBC: 11.1 K/uL — ABNORMAL HIGH (ref 4.0–10.5)
nRBC: 0 % (ref 0.0–0.2)

## 2023-12-25 LAB — GLUCOSE, CAPILLARY
Glucose-Capillary: 107 mg/dL — ABNORMAL HIGH (ref 70–99)
Glucose-Capillary: 97 mg/dL (ref 70–99)
Glucose-Capillary: 124 mg/dL — ABNORMAL HIGH (ref 70–99)
Glucose-Capillary: 301 mg/dL — ABNORMAL HIGH (ref 70–99)
Glucose-Capillary: 279 mg/dL — ABNORMAL HIGH (ref 70–99)

## 2023-12-25 LAB — BASIC METABOLIC PANEL WITH GFR
Anion gap: 12 (ref 5–15)
BUN: 31 mg/dL — ABNORMAL HIGH (ref 8–23)
CO2: 25 mmol/L (ref 22–32)
Calcium: 9.1 mg/dL (ref 8.9–10.3)
Chloride: 107 mmol/L (ref 98–111)
Creatinine, Ser: 1.3 mg/dL — ABNORMAL HIGH (ref 0.44–1.00)
GFR, Estimated: 43 mL/min — ABNORMAL LOW (ref >60)
Glucose, Bld: 99 mg/dL (ref 70–99)
Potassium: 3.4 mmol/L — ABNORMAL LOW (ref 3.5–5.1)
Sodium: 144 mmol/L (ref 135–145)

## 2023-12-25 MED ORDER — ATORVASTATIN CALCIUM 40 MG PO TABS
80.0000 mg | ORAL_TABLET | Freq: Every evening | ORAL | Status: DC
  Administered 2023-12-25 – 2023-12-27 (×3): 80 mg via ORAL
  Filled 2023-12-25 (×3): qty 2

## 2023-12-25 MED ORDER — POTASSIUM CHLORIDE CRYS ER 20 MEQ PO TBCR
30.0000 meq | EXTENDED_RELEASE_TABLET | Freq: Once | ORAL | Status: AC
  Administered 2023-12-25: 30 meq via ORAL
  Filled 2023-12-25: qty 1

## 2023-12-25 MED ORDER — URSODIOL 300 MG PO CAPS
300.0000 mg | ORAL_CAPSULE | Freq: Two times a day (BID) | ORAL | Status: DC
  Administered 2023-12-26 – 2023-12-28 (×5): 300 mg via ORAL
  Filled 2023-12-25 (×7): qty 1

## 2023-12-25 MED ORDER — BISOPROLOL FUMARATE 5 MG PO TABS
5.0000 mg | ORAL_TABLET | Freq: Every day | ORAL | Status: DC
  Administered 2023-12-25 – 2023-12-28 (×4): 5 mg via ORAL
  Filled 2023-12-25 (×4): qty 1

## 2023-12-25 NOTE — Progress Notes (Addendum)
 Pt accepted Lactulose without incident. Tolerated well. Pt had a large amount of brown liquid stool. This pt still appears to show signs of confusion. Plan of care ongoing.

## 2023-12-25 NOTE — Plan of Care (Signed)
  Problem: Clinical Measurements: Goal: Respiratory complications will improve Outcome: Progressing   Problem: Activity: Goal: Risk for activity intolerance will decrease Outcome: Progressing   Problem: Coping: Goal: Level of anxiety will decrease Outcome: Progressing   Problem: Elimination: Goal: Will not experience complications related to bowel motility Outcome: Progressing Goal: Will not experience complications related to urinary retention Outcome: Progressing   Problem: Pain Managment: Goal: General experience of comfort will improve and/or be controlled Outcome: Progressing   Problem: Safety: Goal: Ability to remain free from injury will improve Outcome: Progressing   Problem: Skin Integrity: Goal: Risk for impaired skin integrity will decrease Outcome: Progressing   Problem: Metabolic: Goal: Ability to maintain appropriate glucose levels will improve Outcome: Progressing   Problem: Skin Integrity: Goal: Risk for impaired skin integrity will decrease Outcome: Progressing
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
## 2023-12-26 DIAGNOSIS — K2961 Other gastritis with bleeding: Secondary | ICD-10-CM

## 2023-12-26 LAB — BASIC METABOLIC PANEL WITH GFR
Anion gap: 8 (ref 5–15)
BUN: 30 mg/dL — ABNORMAL HIGH (ref 8–23)
CO2: 26 mmol/L (ref 22–32)
Calcium: 8.4 mg/dL — ABNORMAL LOW (ref 8.9–10.3)
Chloride: 103 mmol/L (ref 98–111)
Creatinine, Ser: 1.53 mg/dL — ABNORMAL HIGH (ref 0.44–1.00)
GFR, Estimated: 36 mL/min — ABNORMAL LOW (ref >60)
Glucose, Bld: 134 mg/dL — ABNORMAL HIGH (ref 70–99)
Potassium: 3.6 mmol/L (ref 3.5–5.1)
Sodium: 138 mmol/L (ref 135–145)

## 2023-12-26 LAB — GLUCOSE, CAPILLARY
Glucose-Capillary: 204 mg/dL — ABNORMAL HIGH (ref 70–99)
Glucose-Capillary: 135 mg/dL — ABNORMAL HIGH (ref 70–99)
Glucose-Capillary: 248 mg/dL — ABNORMAL HIGH (ref 70–99)
Glucose-Capillary: 202 mg/dL — ABNORMAL HIGH (ref 70–99)

## 2023-12-26 LAB — CULTURE, BLOOD (ROUTINE X 2)
Culture: NO GROWTH
Culture: NO GROWTH
Special Requests: ADEQUATE

## 2023-12-26 LAB — CBC
HCT: 36.2 % (ref 36.0–46.0)
Hemoglobin: 11.1 g/dL — ABNORMAL LOW (ref 12.0–15.0)
MCH: 28.3 pg (ref 26.0–34.0)
MCHC: 30.7 g/dL (ref 30.0–36.0)
MCV: 92.3 fL (ref 80.0–100.0)
Platelets: 178 K/uL (ref 150–400)
RBC: 3.92 MIL/uL (ref 3.87–5.11)
RDW: 16.6 % — ABNORMAL HIGH (ref 11.5–15.5)
WBC: 10.8 K/uL — ABNORMAL HIGH (ref 4.0–10.5)
nRBC: 0 % (ref 0.0–0.2)

## 2023-12-26 MED ORDER — PANTOPRAZOLE SODIUM 40 MG PO TBEC
40.0000 mg | DELAYED_RELEASE_TABLET | Freq: Two times a day (BID) | ORAL | Status: DC
  Administered 2023-12-26 – 2023-12-28 (×4): 40 mg via ORAL
  Filled 2023-12-26 (×4): qty 1
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
## 2023-12-26 NOTE — Plan of Care (Signed)
  Problem: Clinical Measurements: Goal: Will remain free from infection Outcome: Progressing Goal: Respiratory complications will improve Outcome: Progressing   Problem: Activity: Goal: Risk for activity intolerance will decrease Outcome: Progressing   Problem: Coping: Goal: Level of anxiety will decrease Outcome: Progressing   Problem: Elimination: Goal: Will not experience complications related to bowel motility Outcome: Progressing Goal: Will not experience complications related to urinary retention Outcome: Progressing   Problem: Pain Managment: Goal: General experience of comfort will improve and/or be controlled Outcome: Progressing   Problem: Safety: Goal: Ability to remain free from injury will improve Outcome: Progressing   Problem: Skin Integrity: Goal: Risk for impaired skin integrity will decrease Outcome: Progressing   Problem: Metabolic: Goal: Ability to maintain appropriate glucose levels will improve Outcome: Progressing

## 2023-12-27 DIAGNOSIS — R0789 Other chest pain: Secondary | ICD-10-CM

## 2023-12-27 DIAGNOSIS — D62 Acute posthemorrhagic anemia: Secondary | ICD-10-CM

## 2023-12-27 DIAGNOSIS — I5042 Chronic combined systolic (congestive) and diastolic (congestive) heart failure: Secondary | ICD-10-CM

## 2023-12-27 DIAGNOSIS — K921 Melena: Secondary | ICD-10-CM | POA: Diagnosis not present

## 2023-12-27 LAB — CBC WITH DIFFERENTIAL/PLATELET
Abs Immature Granulocytes: 0.07 K/uL (ref 0.00–0.07)
Basophils Absolute: 0 K/uL (ref 0.0–0.1)
Basophils Relative: 0 %
Eosinophils Absolute: 0.4 K/uL (ref 0.0–0.5)
Eosinophils Relative: 4 %
HCT: 33.1 % — ABNORMAL LOW (ref 36.0–46.0)
Hemoglobin: 10.2 g/dL — ABNORMAL LOW (ref 12.0–15.0)
Immature Granulocytes: 1 %
Lymphocytes Relative: 19 %
Lymphs Abs: 2.3 K/uL (ref 0.7–4.0)
MCH: 28.1 pg (ref 26.0–34.0)
MCHC: 30.8 g/dL (ref 30.0–36.0)
MCV: 91.2 fL (ref 80.0–100.0)
Monocytes Absolute: 1 K/uL (ref 0.1–1.0)
Monocytes Relative: 8 %
Neutro Abs: 8.5 K/uL — ABNORMAL HIGH (ref 1.7–7.7)
Neutrophils Relative %: 68 %
Platelets: 178 K/uL (ref 150–400)
RBC: 3.63 MIL/uL — ABNORMAL LOW (ref 3.87–5.11)
RDW: 16.2 % — ABNORMAL HIGH (ref 11.5–15.5)
WBC: 12.4 K/uL — ABNORMAL HIGH (ref 4.0–10.5)
nRBC: 0 % (ref 0.0–0.2)

## 2023-12-27 LAB — COMPREHENSIVE METABOLIC PANEL WITH GFR
ALT: 24 U/L (ref 0–44)
AST: 58 U/L — ABNORMAL HIGH (ref 15–41)
Albumin: 2.8 g/dL — ABNORMAL LOW (ref 3.5–5.0)
Alkaline Phosphatase: 230 U/L — ABNORMAL HIGH (ref 38–126)
Anion gap: 10 (ref 5–15)
BUN: 36 mg/dL — ABNORMAL HIGH (ref 8–23)
CO2: 25 mmol/L (ref 22–32)
Calcium: 8.2 mg/dL — ABNORMAL LOW (ref 8.9–10.3)
Chloride: 100 mmol/L (ref 98–111)
Creatinine, Ser: 1.75 mg/dL — ABNORMAL HIGH (ref 0.44–1.00)
GFR, Estimated: 30 mL/min — ABNORMAL LOW (ref >60)
Glucose, Bld: 136 mg/dL — ABNORMAL HIGH (ref 70–99)
Potassium: 3.6 mmol/L (ref 3.5–5.1)
Sodium: 134 mmol/L — ABNORMAL LOW (ref 135–145)
Total Bilirubin: 1.4 mg/dL — ABNORMAL HIGH (ref 0.0–1.2)
Total Protein: 7.5 g/dL (ref 6.5–8.1)

## 2023-12-27 LAB — GLUCOSE, CAPILLARY
Glucose-Capillary: 146 mg/dL — ABNORMAL HIGH (ref 70–99)
Glucose-Capillary: 180 mg/dL — ABNORMAL HIGH (ref 70–99)
Glucose-Capillary: 164 mg/dL — ABNORMAL HIGH (ref 70–99)
Glucose-Capillary: 176 mg/dL — ABNORMAL HIGH (ref 70–99)

## 2023-12-27 LAB — TROPONIN T, HIGH SENSITIVITY
Troponin T High Sensitivity: 160 ng/L (ref 0–19)
Troponin T High Sensitivity: 179 ng/L (ref 0–19)
Troponin T High Sensitivity: 192 ng/L (ref 0–19)
Troponin T High Sensitivity: 186 ng/L (ref 0–19)

## 2023-12-27 MED ORDER — NITROGLYCERIN 0.4 MG SL SUBL: 0.4000 mg | SUBLINGUAL_TABLET | SUBLINGUAL | Status: DC | PRN

## 2023-12-27 MED ORDER — ASPIRIN 325 MG PO TABS
325.0000 mg | ORAL_TABLET | Freq: Once | ORAL | Status: AC
  Administered 2023-12-27: 325 mg via ORAL
  Filled 2023-12-27: qty 1

## 2023-12-27 MED ORDER — ALPRAZOLAM 0.5 MG PO TABS
0.5000 mg | ORAL_TABLET | Freq: Once | ORAL | Status: AC
  Administered 2023-12-27: 0.5 mg via ORAL
  Filled 2023-12-27: qty 1

## 2023-12-27 MED ORDER — ACETAMINOPHEN 325 MG PO TABS: 650.0000 mg | ORAL_TABLET | Freq: Four times a day (QID) | ORAL | 0 refills | Status: DC | PRN

## 2023-12-27 MED ORDER — ROSUVASTATIN CALCIUM 5 MG PO TABS: 5.0000 mg | ORAL_TABLET | Freq: Every day | ORAL | 11 refills | Status: DC

## 2023-12-27 MED ORDER — BISOPROLOL FUMARATE 5 MG PO TABS: 5.0000 mg | ORAL_TABLET | Freq: Every day | ORAL | 0 refills | Status: DC

## 2023-12-27 MED ORDER — LACTULOSE 10 GM/15ML PO SOLN: 20.0000 g | Freq: Three times a day (TID) | ORAL | 0 refills | Status: DC

## 2023-12-27 MED ORDER — PANTOPRAZOLE SODIUM 40 MG PO TBEC: 40.0000 mg | DELAYED_RELEASE_TABLET | Freq: Two times a day (BID) | ORAL | 0 refills | Status: DC

## 2023-12-27 MED ORDER — TORSEMIDE 20 MG PO TABS
40.0000 mg | ORAL_TABLET | Freq: Every day | ORAL | Status: DC
Start: 2023-12-27 — End: 2023-12-28
  Administered 2023-12-27 – 2023-12-28 (×2): 40 mg via ORAL
  Filled 2023-12-27 (×2): qty 2

## 2023-12-27 MED ORDER — APIXABAN 2.5 MG PO TABS: 2.5000 mg | ORAL_TABLET | Freq: Two times a day (BID) | ORAL | 0 refills | Status: DC

## 2023-12-27 NOTE — Discharge Summary (Addendum)
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 time spent: greater than 30 minutes.  Signed: Adriana DELENA Grams, MD [facility] 12/27/2023

## 2023-12-27 NOTE — NC FL2 (Signed)
 [place]  MEDICAID FL2 LEVEL OF CARE FORM     IDENTIFICATION  Patient Name: Stephanie [name] Birthdate: [birth year] Sex: female Admission Date (Current Location): 12/21/2023  [facility] and [place] Number:  [employer] and Address:  [facility],  [address] [place] [address]      Provider Number: [id number]  Attending Physician Name and Address:  Willette Adriana LABOR, MD  Relative Name and Phone Number:  Sandra Brents (daughter) [phone]    Current Level of Care: Hospital Recommended Level of Care: Skilled Nursing Facility Prior Approval Number:    Date Approved/Denied:   PASRR Number: [id number]  Discharge Plan: SNF    Current Diagnoses: Patient Active Problem List   Diagnosis Date Noted   Increased ammonia level 12/24/2023   Confusion 12/24/2023   Heme positive stool 12/24/2023   Encephalopathy, hepatic (HCC) 12/22/2023   GIB (gastrointestinal bleeding) 12/21/2023   Altered mental status, unspecified 12/21/2023   Depression 12/04/2023   History of CVA (cerebrovascular accident) 12/01/2023   COVID-19 01/09/2023   Right-sided chest pain 01/08/2023   Elevated troponin 01/08/2023   Chronic respiratory failure with hypoxia (HCC) 01/08/2023   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   Anxiety 12/23/2021   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) 11/25/2021   Hyperlipidemia 11/25/2021   Anemia due to chronic kidney disease 11/25/2021   Bilateral primary osteoarthritis of knee 09/15/2021   Acute on chronic combined systolic and diastolic CHF (congestive heart failure) (HCC) 05/20/2021   At risk for obstructive sleep apnea 05/20/2021   Protein-calorie malnutrition, moderate 05/17/2021   Cerebral infarction, unspecified (HCC) 05/17/2021   NASH (nonalcoholic steatohepatitis) 01/21/2021   Fibromyalgia 07/14/2020   Gonsalez (dyspnea on exertion) 06/18/2020   Chronic pain syndrome 04/10/2020   Lab test positive for detection  of COVID-19 virus 03/17/2020   Pneumonia due to COVID-19 virus 03/11/2020   Acute respiratory disease due to COVID-19 virus 03/11/2020   Long term (current) use of anticoagulants 08/16/2018   TIA (transient ischemic attack) 09/15/2017   Localized osteoarthritis of right shoulder 08/08/2017   Acute embolism and thrombosis of unspecified deep veins of lower extremity, bilateral (HCC) 03/16/2017   Atrial fibrillation (HCC) 03/16/2017   Chronic kidney disease, stage 3b (HCC) 03/16/2017   Dysphagia 12/16/2015   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   Hyperglycemia due to type 2 diabetes mellitus (HCC) 12/09/2015   History of thromboembolism 11/08/2015   Constipation 10/27/2015   Elevated alkaline phosphatase level 07/23/2015   Alkaline phosphatase raised 07/23/2015   Hypoglycemia 07/01/2015   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   Maculopapular rash, generalized 06/27/2015   Alopecia 06/27/2015   Hypothyroidism 06/27/2015   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) 06/27/2015   Diverticulitis 10/19/2013   Former cigarette smoker 10/19/2013   Obesity 10/19/2013   Type 2 diabetes mellitus with hyperlipidemia (HCC) 10/19/2013   Hypertension    Symptomatic anemia    GERD (gastroesophageal reflux disease)    COPD (chronic obstructive pulmonary disease) (HCC)     Orientation RESPIRATION BLADDER Height & Weight     Self, Time, Situation, Place  O2 (Nasal Canula 2L) Incontinent, External catheter Weight: 181 lb (82.1 kg) (Didn't get patient up this am due to have knee pain and stiffness and didn't want patient to fall) Height:  5' 8 (172.7 cm)  BEHAVIORAL SYMPTOMS/MOOD NEUROLOGICAL BOWEL NUTRITION STATUS      Continent Diet (See DC summary)  AMBULATORY STATUS COMMUNICATION OF NEEDS Skin   Extensive  Assist Verbally Normal                       Personal Care Assistance Level of Assistance  Bathing, Feeding, Dressing Bathing Assistance:  Maximum assistance Feeding assistance: Independent Dressing Assistance: Maximum assistance     Functional Limitations Info  Sight, Hearing, Speech Sight Info: Impaired Hearing Info: Adequate Speech Info: Adequate    SPECIAL CARE FACTORS FREQUENCY  PT (By licensed PT), OT (By licensed OT)     PT Frequency: 5x/wk OT Frequency: 5x/wk            Contractures Contractures Info: Not present    Additional Factors Info  Code Status, Allergies, Psychotropic, Insulin  Sliding Scale Code Status Info: FULL Allergies Info: Tetracyclines & Related,Banana,Jardiance  (empagliflozin ),Penicillins Psychotropic Info: See MAR Insulin  Sliding Scale Info: See MAR       Current Medications (12/27/2023):  This is the current hospital active medication list Current Facility-Administered Medications  Medication Dose Route Frequency Provider Last Rate Last Admin   acetaminophen  (TYLENOL ) tablet 650 mg  650 mg Oral Q6H PRN Shahmehdi, Seyed A, MD       Or   acetaminophen  (TYLENOL ) suppository 650 mg  650 mg Rectal Q6H PRN Shahmehdi, Seyed A, MD       atorvastatin  (LIPITOR ) tablet 80 mg  80 mg Oral QPM Johnson, Clanford L, MD   80 mg at 12/26/23 1716   bisacodyl  (DULCOLAX) EC tablet 5 mg  5 mg Oral Daily PRN Shahmehdi, Adriana LABOR, MD       bisoprolol  (ZEBETA ) tablet 5 mg  5 mg Oral Daily Johnson, Clanford L, MD   5 mg at 12/27/23 9160   Chlorhexidine  Gluconate Cloth 2 % PADS 6 each  6 each Topical Daily Vicci, Clanford L, MD   6 each at 12/27/23 0839   cycloSPORINE (RESTASIS) 0.05 % ophthalmic emulsion 1 drop  1 drop Both Eyes BID Shahmehdi, Seyed A, MD   1 drop at 12/27/23 9160   DULoxetine  (CYMBALTA ) DR capsule 60 mg  60 mg Oral BID Willette Adriana A, MD   60 mg at 12/27/23 9160   hydrALAZINE  (APRESOLINE ) injection 10 mg  10 mg Intravenous Q4H PRN Shahmehdi, Seyed A, MD       HYDROmorphone  (DILAUDID ) injection 0.25 mg  0.25 mg Intravenous Q4H PRN Johnson, Clanford L, MD       insulin  aspart (novoLOG )  injection 0-9 Units  0-9 Units Subcutaneous TID WC Johnson, Clanford L, MD   1 Units at 12/27/23 9160   insulin  glargine-yfgn (SEMGLEE ) injection 10 Units  10 Units Subcutaneous QHS Shahmehdi, Seyed A, MD   10 Units at 12/26/23 2138   ipratropium (ATROVENT) nebulizer solution 0.5 mg  0.5 mg Nebulization Q6H PRN Shahmehdi, Seyed A, MD   0.5 mg at 12/26/23 1153   lactulose (CHRONULAC) 10 GM/15ML solution 20 g  20 g Oral TID Carlan, Chelsea L, NP   20 g at 12/27/23 9160   levothyroxine  (SYNTHROID ) tablet 100 mcg  100 mcg Oral QAC breakfast Shahmehdi, Seyed A, MD   100 mcg at 12/27/23 0507   ondansetron  (ZOFRAN ) tablet 4 mg  4 mg Oral Q6H PRN Shahmehdi, Seyed A, MD       Or   ondansetron  (ZOFRAN ) injection 4 mg  4 mg Intravenous Q6H PRN Shahmehdi, Seyed A, MD       Oral care mouth rinse  15 mL Mouth Rinse PRN Johnson, Clanford L, MD       oxyCODONE  (Oxy IR/ROXICODONE )  immediate release tablet 5 mg  5 mg Oral Q4H PRN Shahmehdi, Seyed A, MD   5 mg at 12/27/23 0915   pantoprazole  (PROTONIX ) EC tablet 40 mg  40 mg Oral BID AC Shahmehdi, Seyed A, MD   40 mg at 12/27/23 9160   pregabalin  (LYRICA ) capsule 25 mg  25 mg Oral Daily Johnson, Clanford L, MD   25 mg at 12/27/23 0839   senna-docusate (Senokot-S) tablet 1 tablet  1 tablet Oral QHS Shahmehdi, Seyed A, MD   1 tablet at 12/26/23 2134   sodium chloride  flush (NS) 0.9 % injection 3 mL  3 mL Intravenous Q12H Shahmehdi, Seyed A, MD   3 mL at 12/27/23 0839   sodium chloride  flush (NS) 0.9 % injection 3 mL  3 mL Intravenous Q12H Shahmehdi, Seyed A, MD   3 mL at 12/27/23 9160   sodium phosphate  (FLEET) enema 1 enema  1 enema Rectal Once PRN Shahmehdi, Seyed A, MD       traZODone  (DESYREL ) tablet 50 mg  50 mg Oral QHS Johnson, Clanford L, MD   50 mg at 12/26/23 2144   ursodiol (ACTIGALL) capsule 300 mg  300 mg Oral BID Vicci, Clanford L, MD   300 mg at 12/27/23 9155     Discharge Medications: Please see discharge summary for a list of discharge  medications.  Relevant Imaging Results:  Relevant Lab Results:   Additional Information [id number]  Hoy DELENA Bigness, LCSW

## 2023-12-27 NOTE — Plan of Care (Signed)
 Palliative-   Chart reviewed.  Patient is being discharged today.  Spoke with daughter Winnifred.  Will place TOC order for outpatient Palliative followup.   Cassondra Stain, AGNP-C Palliative Medicine  No charge

## 2023-12-27 NOTE — Progress Notes (Signed)
 MD notified pt has still not voided. Only showing on bladder scan. Pt states she is not uncomfortable. No new orders at this time.

## 2023-12-27 NOTE — Care Management Important Message (Signed)
 Important Message  Patient Details  Name: Stephanie [name] MRN: [id number] Date of Birth: 1950-09-10   Important Message Given:  Yes - Medicare IM     Albertine Lafoy L Josefa [place] 12/27/2023, 11:22 AM

## 2023-12-27 NOTE — Progress Notes (Signed)
 [facility] Liaison Note:  Notified by [facility] manager of patient/family request for [facility] Palliative services at LTC after discharge.  Please call with any hospice or outpatient palliative care related questions.  Thank you for the opportunity to participate in this patient's care.  Eleanor Nail, LPN [facility] Liaison [phone]

## 2023-12-27 NOTE — Progress Notes (Signed)
  Patient was deemed for discharge- Patient presumably had some panic attack was complaining some mild substernal chest pain which quickly resolved.  The patient was seen and examined Stable at the time of exam, mild shortness of breath improved chest pain  Blood pressure 113/69, pulse 77, temperature 97.6 F (36.4 C), temperature source Oral, resp. rate 19, height 5' 8 (1.727 m), weight 82.1 kg, SpO2 100%.   General:  AAO x 3,  cooperative, no distress;   HEENT:  Normocephalic, PERRL, otherwise with in Normal limits   Neuro:  CNII-XII intact. , normal motor and sensation, reflexes intact   Lungs:   Clear to auscultation BL, Respirations unlabored,  No wheezes / crackles  Cardio:    S1/S2, RRR, No murmure, No Rubs or Gallops   Abdomen:  Soft, non-tender, bowel sounds active all four quadrants, no guarding or peritoneal signs.  Muscular  skeletal:  Limited exam -global generalized weaknesses - in bed, able to move all 4 extremities,   2+ pulses,  symmetric, No pitting edema  Skin:  Dry, warm to touch, negative for any Rashes,  Wounds: Please see nursing documentation        Chest pain: Patient received aspirin , Xanax, EKG was obtained   Nitroglycerin and morphine  ordered Nonspecific changes on EKG, rate of 78, old Q waves, no obvious ST elevation or depression  -Cardiology was previously consulted, notified-appreciate further evaluation and recommendations  Discontinuing discharge order, observing overnight, recycling cardiac enzymes x 3.   Due to recent GI bleed, Eliquis  has been on hold Has received 2U PRBC on this admission.  History of diastolic heart failure HFrEF, with CKD stage IIIb Patient has been diuresed with IV Lasix_was switched to torsemide  p.o.  TEE 11/17/2023 showing reduced LVEF 35-40%, regional wall motion abnormalities, mildly reduced RV function -Patient has been evaluated by heart failure team on 12/06/2023 Cardiology was consulted on this admission  for elevated troponin   SIGNED: Adriana DELENA Grams, MD, FHM. FAAFP [facility],  Pager (please use [email or web address] to page/text)  Please use Epic Secure Chat for non-urgent communication (7AM-7PM) If 7PM-7AM, please contact night-coverage [email or web address],  12/27/2023, 2:11 PM

## 2023-12-27 NOTE — Progress Notes (Signed)
 Date and time results received: 12/27/23 1253   Test: Troponin Critical Value: 160  Name of Provider Notified: Dr. Willette   Orders Received? Or Actions Taken?: Per MD will repeat troponin and place on telemetry.

## 2023-12-27 NOTE — Progress Notes (Signed)
 Foley pulled per order at 0930 this am. Pt voided 20ml around lunchtime, at 1600 bladder scan only revealed 76. MD made aware, will encourage fluid. No new orders at this time.

## 2023-12-27 NOTE — Plan of Care (Signed)
   Problem: Education: Goal: Knowledge of General Education information will improve Description Including pain rating scale, medication(s)/side effects and non-pharmacologic comfort measures Outcome: Progressing   Problem: Health Behavior/Discharge Planning: Goal: Ability to manage health-related needs will improve Outcome: Progressing

## 2023-12-27 NOTE — TOC Progression Note (Signed)
 Transition of Care [facility]) - Progression Note    Patient Details  Name: Stephanie [name] MRN: [id number] Date of Birth: 1950-03-20  Transition of [facility]) CM/SW Contact  Hoy DELENA Bigness, LCSW Phone Number: 12/27/2023, 2:37 PM  Clinical Narrative:    Pt/daughter agreeable to recommendation for outpatient palliative care. Referral has been made to [facility] for palliative care services.   Expected Discharge Plan: Skilled Nursing Facility Barriers to Discharge: Barriers Resolved               Expected Discharge Plan and Services In-house Referral: Clinical Social Work   Post Acute Care Choice: Skilled Nursing Facility, Durable Medical Equipment Living arrangements for the past 2 months: Single Family Home Expected Discharge Date: 12/27/23               DME Arranged: N/A DME Agency: NA                   Social Drivers of Health (SDOH) Interventions SDOH Screenings   Food Insecurity: No Food Insecurity (12/21/2023)  Housing: Unknown (12/21/2023)  Transportation Needs: No Transportation Needs (11/17/2023)  Utilities: Not At Risk (11/17/2023)  Depression (PHQ2-9): Low Risk  (09/14/2023)  Social Connections: Moderately Isolated (11/17/2023)  Tobacco Use: Medium Risk (12/21/2023)    Readmission Risk Interventions    12/27/2023   10:08 AM 12/25/2023   10:02 AM 12/23/2023    8:35 AM  Readmission Risk Prevention Plan  Transportation Screening Complete Complete Complete  Medication Review Oceanographer) Complete Complete Complete  PCP or Specialist appointment within 3-5 days of discharge Complete    HRI or Home Care Consult Complete Complete Complete  SW Recovery Care/Counseling Consult Complete Complete Complete  Palliative Care Screening Complete Not Applicable Not Applicable  Skilled Nursing Facility Complete Complete Complete

## 2023-12-27 NOTE — Progress Notes (Signed)
 Mobility Specialist Progress Note:    12/27/23 0920  Mobility  Activity Pivoted/transferred to/from [facility]  Level of Assistance Moderate assist, patient does 50-74%  Assistive Device None  Distance Ambulated (ft) 2 ft  Range of Motion/Exercises Active;All extremities  Activity Response Tolerated well  Mobility Referral Yes  Mobility visit 1 Mobility  Mobility Specialist Start Time (ACUTE ONLY) 0920  Mobility Specialist Stop Time (ACUTE ONLY) 0940  Mobility Specialist Time Calculation (min) (ACUTE ONLY) 20 min   Pt received sitting EOB, requesting assistance to [facility]. Required ModA with no AD to stand and pivot. Tolerated well, c/o right knee pain. Call bell in reach, all needs met.  Oluwaseyi Raffel Mobility Specialist Please contact via Special Educational Needs Teacher or  Rehab office at [phone]

## 2023-12-27 NOTE — Progress Notes (Signed)
 Rounding Note   Patient Name: Stephanie [name] Date of Encounter: 12/27/2023  [facility] Cardiologist: Jayson Sierras, MD   Subjective Reports brief episode of chest pain while sitting on bedside toilet, described as throbbing, 7/10, located mid central, lasting for seconds, associated with dizziness.  Denies any SOB, palpitations, N/V, diaphoresis.  Patient notes that a friend died yesterday and she was thinking about friend at time of chest pain and suspects it was due to anxiety.  Currently, chest pain-free.  Scheduled Meds:  atorvastatin   80 mg Oral QPM   bisoprolol   5 mg Oral Daily   Chlorhexidine  Gluconate Cloth  6 each Topical Daily   cycloSPORINE  1 drop Both Eyes BID   DULoxetine   60 mg Oral BID   insulin  aspart  0-9 Units Subcutaneous TID WC   insulin  glargine-yfgn  10 Units Subcutaneous QHS   lactulose  20 g Oral TID   levothyroxine   100 mcg Oral QAC breakfast   pantoprazole   40 mg Oral BID AC   pregabalin   25 mg Oral Daily   senna-docusate  1 tablet Oral QHS   sodium chloride  flush  3 mL Intravenous Q12H   sodium chloride  flush  3 mL Intravenous Q12H   torsemide   40 mg Oral Daily   traZODone   50 mg Oral QHS   ursodiol  300 mg Oral BID   Continuous Infusions:  PRN Meds: acetaminophen  **OR** acetaminophen , bisacodyl , hydrALAZINE , HYDROmorphone  (DILAUDID ) injection, ipratropium, nitroGLYCERIN, ondansetron  **OR** ondansetron  (ZOFRAN ) IV, mouth rinse, oxyCODONE , sodium phosphate    Vital Signs  Vitals:   12/27/23 0845 12/27/23 1146 12/27/23 1156 12/27/23 1316  BP: 120/69 116/80  113/69  Pulse: 82 80  77  Resp:    19  Temp:    97.6 F (36.4 C)  TempSrc:    Oral  SpO2:  98% 100% 100%  Weight:      Height:        Intake/Output Summary (Last 24 hours) at 12/27/2023 1419 Last data filed at 12/27/2023 0830 Gross per 24 hour  Intake 963 ml  Output 900 ml  Net 63 ml      12/27/2023    5:09 AM 12/26/2023    5:33 AM 12/25/2023    4:14 AM  Last 3  Weights  Weight (lbs) 181 lb 181 lb 3.5 oz 175 lb 0.7 oz  Weight (kg) 82.1 kg 82.2 kg 79.4 kg       ECG  NSR, HR 78, PVC with no acute ischemic changes - Personally Reviewed  Physical Exam GEN: No acute distress wearing 1L [place].  Neck: No JVD Cardiac: RRR, no murmurs, rubs, or gallops.  Respiratory: Clear to auscultation bilaterally. GI: Soft, nontender, non-distended  MS: No edema; No deformity. Neuro:  Nonfocal  Psych: Normal affect   Labs High Sensitivity Troponin:  No results for input(s): TROPONINIHS in the last 720 hours.   Chemistry Recent Labs  Lab 12/21/23 1251 12/21/23 1519 12/22/23 0423 12/25/23 0445 12/26/23 0528 12/27/23 0837  NA 140  --    < > 144 138 134*  K 4.4  --    < > 3.4* 3.6 3.6  CL 103  --    < > 107 103 100  CO2 27  --    < > 25 26 25   GLUCOSE 110*  --    < > 99 134* 136*  BUN 52*  --    < > 31* 30* 36*  CREATININE 1.97*  --    < >  1.30* 1.53* 1.75*  CALCIUM  9.1  --    < > 9.1 8.4* 8.2*  MG  --  2.1  --   --   --   --   PROT 8.1  --   --   --   --  7.5  ALBUMIN 3.1*  --   --   --   --  2.8*  AST 45*  --   --   --   --  58*  ALT 16  --   --   --   --  24  ALKPHOS 212*  --   --   --   --  230*  BILITOT 1.3*  --   --   --   --  1.4*  GFRNONAA 26*  --    < > 43* 36* 30*  ANIONGAP 11  --    < > 12 8 10    < > = values in this interval not displayed.    Lipids  Recent Labs  Lab 12/21/23 1541  CHOL 122  TRIG 75  HDL 56  LDLCALC 51  CHOLHDL 2.2    Hematology Recent Labs  Lab 12/25/23 0445 12/26/23 0528 12/27/23 0837  WBC 11.1* 10.8* 12.4*  RBC 3.81* 3.92 3.63*  HGB 10.7* 11.1* 10.2*  HCT 35.1* 36.2 33.1*  MCV 92.1 92.3 91.2  MCH 28.1 28.3 28.1  MCHC 30.5 30.7 30.8  RDW 16.9* 16.6* 16.2*  PLT 186 178 178   Thyroid  No results for input(s): TSH, FREET4 in the last 168 hours.  BNP Recent Labs  Lab 12/22/23 0423  PROBNP 14,392.0*    DDimer No results for input(s): DDIMER in the last 168 hours.   Radiology  No results  found.  Cardiac Studies ECHO 11/17/2023 IMPRESSIONS   1. Left ventricular ejection fraction, by estimation, is 35 to 40%. Left  ventricular ejection fraction by 3D volume is 33 %. The left ventricle has  moderately decreased function. The left ventricle demonstrates regional  wall motion abnormalities (see  scoring diagram/findings for description). The left ventricular internal  cavity size was mildly dilated. Left ventricular diastolic parameters are  indeterminate.   2. Right ventricular systolic function is mildly reduced. The right  ventricular size is severely enlarged. Moderately increased right  ventricular wall thickness. There is normal pulmonary artery systolic  pressure. The estimated right ventricular  systolic pressure is 31.8 mmHg.   3. Large pleural effusion.   4. The mitral valve is normal in structure. Mild mitral valve  regurgitation. No evidence of mitral stenosis.   5. The aortic valve is tricuspid. Aortic valve regurgitation is not  visualized. No aortic stenosis is present.   6. The inferior vena cava is dilated in size with >50% respiratory  variability, suggesting right atrial pressure of 8 mmHg.   Patient Profile   73 y.o. female  with a hx of chronic HFrEF/prominent RV dysfunction failure (Echo 11/17/2023: EF 35-40%, severe RV enlargment w/ mildly reduced systolic fx), CAD, HTN, CKD 3B, DM2, OSA, hx of recurrent LE DVT, chronic liver disease.  Ms. Frater was recently hospitalized with management by the heart failure team, end-stage symptoms and not a candidate for advanced therapies including LVAD/transplantation. She was discharged to SNF with palliative care services. Recent office visit from October 30 reviewed, at that time on Aldactone, Demadex , and Eliquis  with history of recurrent DVTs. Currently admitted to the hospitalist service at [facility] from [facility] due to altered mental status, found to be severely  anemic with hemoglobin down to 6.4 and  Hemoccult positive stools. Patient received 2 units PRBCs and was offered endoscopy by GI service, family has declined at this point. Cardiology was previously consulted with question about Eliquis . Reccommended to continue to hold [facility] in setting of acute GI.  Noted on Eliquis  with history of recurrent DVT, not atrial fibrillation. Cardiology signed off.   Patient was in process of discharge when she complained of chest pain. Asked to round on 12/27/2023 for the evaluation of chest pain at the request of Dr. Willette.  Assessment & Plan  CAD  Coronary artery classifications by CT imaging Echo 11/17/2023: EF 35-40% as below.  Reports brief episode of chest pain while sitting on bedside toilet, described as throbbing, 7/10, located mid central, lasting for seconds, associated with dizziness.  Denies any SOB, palpitations, N/V, diaphoresis.  Patient notes that a friend died yesterday and she was thinking about friend at time of chest pain and suspects it was due to anxiety.  Treated with ASA 324 mg and Xanax. Currently, chest pain-free. 11/4 TN 232>225 --> today Tn 160, 2nd pending  LDL 51 in 12/2023 EKG: NSR, HR 78, PVC's w/ no acute ischemic changes.  Continue bisoprolol  5 mg Continue statin and repatha . Discharge summary notes patient should be on Crestor 5 mg, however med list pending for Lipitor  80 mg. Defer to primary team, which is correct.  Less concern for ACS. No further ischemic evaluation at this time. Can reassess based on pending troponin.   Hx of recurrent LE DVT  Acute on chronic anemia/hemoccult positive:  Hgb 6.4 >9.9 with 2 units of PRBC. GI offered EGD/colonoscopy, which was consider high risk, however family prefers to hold off on any invasive procedures and prefer more conservative management.   Eliquis  on hold due to severe anemia suspected due to GI bleed. Cardiology recommend continuing to hold Eliquis  with risk >>> benefits.  Most recent Hgb 10.2  Managed by GI.    Acute  on chronic HFrEF RV Dysfunction  Echo 11/17/2023: EF 35-40%, severe RV enlargment w/ mildly reduced systolic fx, estimated RVSP 32 mmHg. Mild MR/TR  RHC 11/26/2023: RA 27, PA 48/26, PCW 22, PVR 2.2, TD CI 1.8, PAPi 0.81>>Likely end-stage physiology  pro BNP 14,392 Treated with IV Lasix  60 mg x1, PO torsemide  60 mg x3. Net  I/O -5169 ml, wt 183 >181 lbs (12/06/2023 wt 202lbs). Cr 1.97 >1.75. Appear euvolemic on exam. Scheduled to start torsemide  40 mg daily.   Not candidate for transplant given age, obesity and other co morbidities Not candidate for LVAD given severity of RV failure GDMT limited by CKD. Avoid SGLT2i due to hx of UTI's.  She is in end-stage.  Recently discharged to SNF with palliative support but refused hospice.  CKD 3B CR 1.97>1.75 (Baseline CR  1.9)  Continue to monitor   AMS, resolved  Managed per primary team Suspect multifactorial likely secondary to polypharmacy, edema, rule out infection, rule out acute CVA   For questions or updates, please contact [facility] Please consult [email or web address] for contact info under       Signed, Lorette CINDERELLA Kapur, PA-C  12/27/2023, 2:19 PM

## 2023-12-28 DIAGNOSIS — K921 Melena: Secondary | ICD-10-CM | POA: Diagnosis not present

## 2023-12-28 LAB — GLUCOSE, CAPILLARY
Glucose-Capillary: 143 mg/dL — ABNORMAL HIGH (ref 70–99)
Glucose-Capillary: 202 mg/dL — ABNORMAL HIGH (ref 70–99)

## 2023-12-28 LAB — CBC
HCT: 32.3 % — ABNORMAL LOW (ref 36.0–46.0)
Hemoglobin: 10 g/dL — ABNORMAL LOW (ref 12.0–15.0)
MCH: 28.8 pg (ref 26.0–34.0)
MCHC: 31 g/dL (ref 30.0–36.0)
MCV: 93.1 fL (ref 80.0–100.0)
Platelets: 161 K/uL (ref 150–400)
RBC: 3.47 MIL/uL — ABNORMAL LOW (ref 3.87–5.11)
RDW: 16.2 % — ABNORMAL HIGH (ref 11.5–15.5)
WBC: 11.2 K/uL — ABNORMAL HIGH (ref 4.0–10.5)
nRBC: 0 % (ref 0.0–0.2)

## 2023-12-28 LAB — TROPONIN T, HIGH SENSITIVITY: Troponin T High Sensitivity: 172 ng/L (ref 0–19)

## 2023-12-28 MED ORDER — TORSEMIDE 40 MG PO TABS: 40.0000 mg | ORAL_TABLET | Freq: Every day | ORAL | 1 refills | Status: DC

## 2023-12-28 NOTE — Progress Notes (Signed)
 Report called to Sylvia at [facility].

## 2023-12-28 NOTE — Progress Notes (Signed)
 Bladder scanned patient due to not voiding yet, bladder scan showed 281 in bladder. Patient verbalized no complaints, and this writer noted no distention. MD Shona made aware. Later during shift, patient stated she had used the bathroom. Patient cleaned up by staff and hydration offered.

## 2023-12-28 NOTE — Discharge Summary (Signed)
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 Function Tests: Recent Labs  Lab 12/21/23 1251 12/27/23 0837  AST 45* 58*  ALT 16 24  ALKPHOS 212* 230*  BILITOT 1.3* 1.4*  PROT 8.1 7.5  ALBUMIN 3.1* 2.8*   CBG: Recent Labs  Lab 12/27/23 0747 12/27/23 1111 12/27/23 1615 12/27/23 2007 12/28/23 0726  GLUCAP 146* 180* 164* 176* 143*    Discharge time spent: greater than 30 minutes.  Signed: Adriana DELENA Grams, MD [facility] 12/28/2023

## 2023-12-28 NOTE — TOC Transition Note (Signed)
 Transition of Care [facility]) - Discharge Note   Patient Details  Name: Stephanie [name] MRN: [id number] Date of Birth: 1950-09-06  Transition of Care [facility]) CM/SW Contact:  Lucie Lunger, LCSWA Phone Number: 12/28/2023, 12:00 PM   Clinical Narrative:    CSW updated that pt is medically stable for D/C back to [facility] today. CSW updated [facility] who confirms they are ready to accept pt. D/C clinicals sent to facility via HUB. Room and report numbers sent to RN. Med necessity printed to floor. EMS to be called when RN is ready. CSW to update pts daughter on plan for D/C. TOC signing off.   Final next level of care: Skilled Nursing Facility Barriers to Discharge: Barriers Resolved   Patient Goals and CMS Choice Patient states their goals for this hospitalization and ongoing recovery are:: go back to SNF CMS Medicare.gov Compare Post Acute Care list provided to:: Patient Choice offered to / list presented to : Patient [place] ownership interest in [facility].provided to:: Adult Children    Discharge Placement              Patient chooses bed at: Other - please specify in the comment section below: [facility]) Patient to be transferred to facility by: [facility] Name of family member notified: [place] Patient and family notified of of transfer: 12/27/23  Discharge Plan and Services Additional resources added to the After Visit Summary for   In-house Referral: Clinical Social Work   Post Acute Care Choice: Skilled Nursing Facility, Durable Medical Equipment          DME Arranged: N/A DME Agency: NA                  Social Drivers of Health (SDOH) Interventions SDOH Screenings   Food Insecurity: No Food Insecurity (12/21/2023)  Housing: Unknown (12/21/2023)  Transportation Needs: No Transportation Needs (11/17/2023)  Utilities: Not At Risk (11/17/2023)  Depression (PHQ2-9): Low Risk  (09/14/2023)  Social Connections: Moderately Isolated (11/17/2023)  Tobacco Use: Medium  Risk (12/21/2023)     Readmission Risk Interventions    12/27/2023   10:08 AM 12/25/2023   10:02 AM 12/23/2023    8:35 AM  Readmission Risk Prevention Plan  Transportation Screening Complete Complete Complete  Medication Review Oceanographer) Complete Complete Complete  PCP or Specialist appointment within 3-5 days of discharge Complete    HRI or Home Care Consult Complete Complete Complete  SW Recovery Care/Counseling Consult Complete Complete Complete  Palliative Care Screening Complete Not Applicable Not Applicable  Skilled Nursing Facility Complete Complete Complete

## 2023-12-28 NOTE — Progress Notes (Signed)
 Bladder scanned patient due to not voiding, bladder scan showed 171 in bladder. Patient reported no discomfort, encouraged fluids. MD Shona made aware.

## 2023-12-28 NOTE — Progress Notes (Signed)
 Pt had small brown BM on BSC with some small red/burgundy blood dripping out of rectum.  Notified MD.

## 2023-12-28 NOTE — Progress Notes (Signed)
 Noted patients troponin level 172 at 1957. MD Shona made aware. No new orders.

## 2024-01-11 ENCOUNTER — Other Ambulatory Visit: Payer: Self-pay | Admitting: Nurse Practitioner

## 2024-01-18 ENCOUNTER — Other Ambulatory Visit: Payer: Self-pay

## 2024-01-18 DIAGNOSIS — D649 Anemia, unspecified: Secondary | ICD-10-CM

## 2024-01-18 DIAGNOSIS — D509 Iron deficiency anemia, unspecified: Secondary | ICD-10-CM

## 2024-01-18 DIAGNOSIS — D472 Monoclonal gammopathy: Secondary | ICD-10-CM

## 2024-01-18 DIAGNOSIS — R17 Unspecified jaundice: Secondary | ICD-10-CM

## 2024-01-19 ENCOUNTER — Inpatient Hospital Stay

## 2024-01-21 ENCOUNTER — Inpatient Hospital Stay: Admitting: Oncology

## 2024-02-02 NOTE — Progress Notes (Deleted)
 Cardiology Office Note:  .   Date:  02/02/2024  ID:  Stephanie [name], DOB June 06, 1950, MRN [id number] PCP: Roni, [facility]  [facility] Providers Cardiologist:  Jayson Sierras, MD {  History of Present Illness: .   Stephanie [name] is a 73 y.o. female  with PMHx of chronic HFrEF/prominent RV dysfunction failure, CAD, HTN, HLD, CKD 3B, DM2, OSA, hx of recurrent LE DVT, chronic liver disease, COPD/asthma, hypothyroidism, fibromyalgia, GERD who reports to [facility] office for hospital follow up for TIA.   Pertinent cardiac medical history:  End Stage HF/Chronic HFrEF/prominent RV dysfunction failure  Echo 11/17/2023: EF 35-40%, severe RV enlargment w/ mildly reduced systolic fx, estimated RVSP 32 mmHg. Mild MR/TR  RHC 11/26/2023: RA 27, PA 48/26, PCW 22, PVR 2.2, TD CI 1.8, PAPi 0.81>>Likely end-stage physiology  Not candidate for transplant given age, obesity and other co morbidities Not candidate for LVAD given severity of RV failure GDMT limited by CKD. Avoid SGLT2i due to hx of UTI's.  CAD  She has chronically elevated troponin.  Never had ischemia evaluation previously due to CKD.  Hx of recurrent LE DVT Eliquis  on hold due to GI bleed as above  GI bleed  12/2023: Severe anemia requiring 2 units of PRBC  Family denied any invasive procedures and preferred conservative management    Last seen in heartcare *DATE* with *PROVIDER* for  Recent hospitalization 11/4/-12/2023 for AMS with confusion then found to have acute GI bleed.  GI was consulted.  Required 2 units of PRBCs with stable H&H.  Patient's family refused GI scope due to increased risk of complications given her end-stage heart failure and preferred medical management. Cardiology was consulted for chest pain and elevated troponin. Determined that she has chronically elevated troponin. Discontinued Lipitor  80 mg. Switched to Crestor  to 5 mg p.o. daily.  Continue Repatha , Bisoprolol  5 mg daily, Torsemide  40 mg daily,   KCL 20 MEQ daily.   Today, reports ### and denies ###.  Denies chest pain, shortness of breath, palpitations, syncope, presyncope, dizziness, orthopnea, PND, swelling or significant weight changes, acute bleeding, or claudication.   Reports compliance with medications.  Dietary habitats:  Activity level:  Social: Denies tobacco use/alcohol/drug use  Denies any recent hospitalizations or visits to the emergency department.   End stage heart failure (HCC) HFrEF (heart failure with reduced ejection fraction) (HCC) Dysfunction of right cardiac ventricle Denies SOB, edema, orthopnea, or PND. Appears Euvolemic on exam.  Continue on  GDMT limited by CKD. Avoid SGLT2i due to hx of UTI's.  Not candidate for transplant given age, obesity and other co morbidities Not candidate for LVAD given severity of RV failure Encouraged low sodium diet, fluid restriction <2L, and daily weights.  Educated to contact our office for weight gain of 2 lbs overnight or 5 lbs in one week. ED precautions discussed.   Essential hypertension Reports well controlled Home BP:  BP this OV well controlled today:  Continue on / Managed by GDMT as above.  Encourage physical activity for 150 minutes per week and heart healthy low sodium diet. Discussed limiting sodium intake to < 2 grams daily.     Coronary artery disease involving native coronary artery of native heart without angina pectoris HLD, LDL goal < 70 Denies angina symptoms. No need for further ischemic evaluation at this time.  LDL? LFT? Continue ASA 81 mg,  Not on ASA due ?  No plans for ischemia evaluation previously due to CKD. Also, family prefer to  avoid invasive procedures and prefer conservative management.   Lipid Panel     Component Value Date/Time   CHOL 122 12/21/2023 1541   CHOL 186 03/16/2023 1015   TRIG 75 12/21/2023 1541   HDL 56 12/21/2023 1541   HDL 60 03/16/2023 1015   CHOLHDL 2.2 12/21/2023 1541   VLDL 15 12/21/2023 1541   LDLCALC  51 12/21/2023 1541   LDLCALC 109 (H) 03/16/2023 1015   LABVLDL 17 03/16/2023 1015   History of DVT of lower extremity History of GI bleed Anemia, unspecified type Eliquis  on hold due to GI bleed in 12/2023 requiring 2 units of PRBC  Stage 3b chronic kidney disease (HCC) ***  Chronic liver disease In setting of advanced biventricular heart failure    ROS: 10 point review of system has been reviewed and considered negative except ones been listed in the HPI.   Studies Reviewed: SABRA   ECHO 11/2023 IMPRESSIONS   1. Left ventricular ejection fraction, by estimation, is 35 to 40%. Left  ventricular ejection fraction by 3D volume is 33 %. The left ventricle has  moderately decreased function. The left ventricle demonstrates regional  wall motion abnormalities (see  scoring diagram/findings for description). The left ventricular internal  cavity size was mildly dilated. Left ventricular diastolic parameters are  indeterminate.   2. Right ventricular systolic function is mildly reduced. The right  ventricular size is severely enlarged. Moderately increased right  ventricular wall thickness. There is normal pulmonary artery systolic  pressure. The estimated right ventricular  systolic pressure is 31.8 mmHg.   3. Large pleural effusion.   4. The mitral valve is normal in structure. Mild mitral valve  regurgitation. No evidence of mitral stenosis.   5. The aortic valve is tricuspid. Aortic valve regurgitation is not  visualized. No aortic stenosis is present.   6. The inferior vena cava is dilated in size with >50% respiratory  variability, suggesting right atrial pressure of 8 mmHg.   RHC 11/2023 Findings:   RA =  27 RV = 52/26 PA =  48/26 (36) PCW = 22 Fick cardiac output/index = 6.3/2.8 Thermo CO/CI = 4.0/1.8 PVR = 2.2 (Fick) 3.5 (TD) Ao sat = 97% PA sat = 62%, 64% PAPi = 0.81   Assessment: 1. Severe biventricular HF (R>L) with significant volume overload and low output  (suspect Thermo more accurate than Fick)   Plan/Discussion:    Likely end-stage physiology. Can consider inpatient trial of milrinone  and IV diuresis to optimize but would strongly consider Palliative Care involvement.   Toribio Fuel, MD  8:53 AM   Risk Assessment/Calculations:   {Does this patient have ATRIAL FIBRILLATION?:[id number]} No BP recorded.  {Refresh Note OR Click here to enter BP  :1}***       Physical Exam:   VS:  There were no vitals taken for this visit.   Wt Readings from Last 3 Encounters:  12/28/23 185 lb 13.6 oz (84.3 kg)  12/16/23 183 lb 6.4 oz (83.2 kg)  12/05/23 203 lb 3.2 oz (92.2 kg)    GEN: Well nourished, well developed in no acute distress while sitting in chair.  NECK: No JVD; No carotid bruits CARDIAC: ***RRR, no murmurs, rubs, gallops RESPIRATORY:  Clear to auscultation without rales, wheezing or rhonchi  ABDOMEN: Soft, non-tender, non-distended EXTREMITIES:  No edema; No deformity   ASSESSMENT AND PLAN: .   ***    {Are you ordering a CV Procedure (e.g. stress test, cath, DCCV, TEE, etc)?   Press F2        :  [id number]}  Dispo: ***  Signed, Lorette CINDERELLA Kapur, PA-C

## 2024-02-02 NOTE — Progress Notes (Incomplete)
 ADVANCED HF CLINIC CONSULT NOTE  Referring Physician: Roni, [facility] Primary Care: [place], [facility] Primary Cardiologist: Jayson Sierras, MD AHF cardiologist: Dr. Zenaida  Chief Complaint: End-stage BiV HF HPI: Stephanie [name] is a 73 y.o. female w/ chronic systolic heart failure w/ prominent RV dysfunction, hypertension, CKD IIIb, T2DM, OSA and h/o LE DVT.  Admitted 10/25 w/ a/c CHF w/ marked volume overload. Further w/u w/ RHC demonstrated severe RV dominant/ low output heart failure, felt to be end-stage. AHF team consulted to assist with palliative diuresis and inotrope support. Diuresed ~60lbs during admission, milrinone  weaned off. Discharged to SNF with palliative care services to follow.   Today she returns for post hospital follow up, her daughter Luke was on the phone during the visit. Overall feeling ok. Denies palpitations, CP, dizziness, edema, or PND/Orthopnea. SOB with activity, works with PT. Usually wears CPAP at night but has not been wearing it at the facility. Wears O2 at home. Appetite ok, food is ok at facility. Has been weighing weekly at the facility. Taking all medications. Currently at [facility], plan for home possibly monday. Followed by palliative care.   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 102 each 5   acetaminophen  (TYLENOL ) 325 MG tablet Take 2 tablets (650 mg total) by mouth every 6 (six) hours as needed for mild pain (pain score 1-3) (or Fever >/= 101). 30 tablet 0   albuterol  (PROVENTIL ) (2.5 MG/3ML) 0.083% nebulizer solution Take 2.5 mg by nebulization every 6 (six) hours as needed for wheezing or shortness of breath.     albuterol  (VENTOLIN  HFA) 108 (90 Base) MCG/ACT inhaler Inhale 1-2 puffs into the lungs every 6 (six) hours as needed for wheezing or shortness of breath. 18 g 0   Alcohol Swabs (ALCOHOL PADS) 70 % PADS SMARTSIG:Pledget(s) Topical 4 Times Daily     aluminum-magnesium  hydroxide-simethicone  (MAALOX) 200-200-20 MG/5ML SUSP Take 5 mLs by mouth as needed (GERD).     apixaban  (ELIQUIS ) 2.5 MG TABS tablet Take 1 tablet (2.5 mg total) by mouth 2 (two) times daily. 60 tablet 0   bisoprolol  (ZEBETA ) 5 MG tablet Take 1 tablet (5 mg total) by mouth daily. 30 tablet 0   Blood Glucose Calibration (ACCU-CHEK GUIDE CONTROL) LIQD See admin instructions.     cholecalciferol  (VITAMIN D3) 25 MCG (1000 UNIT) tablet Take 3,000 Units by mouth daily.     cimetidine  (TAGAMET ) 200 MG tablet Take 0.5 tablets (100 mg total) by mouth daily as needed. (Patient taking differently: Take 100 mg by mouth daily as needed (heartburn).)     CINNAMON PO Take 2 capsules by mouth 3 (three) times daily.     clobetasol  ointment (TEMOVATE ) 0.05 %  Apply 1 application  topically as needed (inflammation).     Continuous Glucose Receiver (FREESTYLE LIBRE 3 READER) DEVI USE TO MONITOR GLUCOSE CONTINUOUSLY AS DIRECTED 1 each 0   Continuous Glucose Sensor (FREESTYLE LIBRE 3 PLUS SENSOR) MISC USE TO MONITOR GLUCOSE CONTINUOUSLY AS DIRECTED. CHANGE SENSOR EVERY 15 DAYS. 6 each 1   DULoxetine  (CYMBALTA ) 60 MG capsule Take 60 mg by mouth 2 (two) times daily.     EASY COMFORT PEN NEEDLES 31G X 5 MM MISC INJECT IN SULIN EVERY DAY AS DIRECTED     Evolocumab  (REPATHA  SURECLICK) 140  MG/ML SOAJ Inject 140 mg into the skin every 14 (fourteen) days. 2 mL 3   fluticasone  (FLONASE ) 50 MCG/ACT nasal spray Place 2 sprays into both nostrils as needed (congestion).     glucose blood (ACCU-CHEK GUIDE TEST) test strip Use 1 test strip four(4) times daily to monitor blood glucose as directed by provider. 200 each 5   insulin  glargine (LANTUS ) 100 UNIT/ML injection Inject 15 Units into the skin at bedtime. 2200     lactulose  (CHRONULAC ) 10 GM/15ML solution Take 30 mLs (20 g total) by mouth 3 (three) times daily. 236 mL 0   Lancet Devices (EASY MINI EJECT LANCING DEVICE) MISC 4 (four) times daily.     levothyroxine  (SYNTHROID ) 100 MCG tablet Take 1 tablet (100 mcg total) by mouth daily before breakfast. 90 tablet 1   lidocaine  (LIDODERM ) 5 % Place 1 patch onto the skin daily. Remove & Discard patch within 12 hours or as directed by MD 30 patch 0   pantoprazole  (PROTONIX ) 40 MG tablet Take 1 tablet (40 mg total) by mouth 2 (two) times daily. 60 tablet 0   potassium chloride  SA (KLOR-CON  M) 20 MEQ tablet Take 20 mEq by mouth once.     pregabalin  (LYRICA ) 75 MG capsule Take 1 capsule (75 mg total) by mouth daily. 30 capsule 0   RESTASIS  0.05 % ophthalmic emulsion Place 1 drop into both eyes 2 (two) times daily.     rosuvastatin  (CRESTOR ) 5 MG tablet Take 1 tablet (5 mg total) by mouth daily. 30 tablet 11   Skin Protectants, Misc. (INTERDRY 10X144) SHEE Change pad daily or if soiled 1 each 0   spironolactone  (ALDACTONE ) 25 MG tablet Take 25 mg by mouth daily.     tirzepatide  (MOUNJARO ) 12.5 MG/0.5ML Pen Inject 12.5 mg into the skin once a week. (Patient taking differently: Inject 12.5 mg into the skin every Monday.) 6 mL 3   torsemide  40 MG TABS Take 40 mg by mouth daily. 30 tablet 1   traZODone  (DESYREL ) 150 MG tablet Take 150 mg by mouth every evening. 1900     ursodiol  (ACTIGALL ) 300 MG capsule Take 300 mg by mouth 2 (two) times daily.     No current facility-administered medications for  this visit.   Allergies  Allergen Reactions   Tetracyclines & Related Anaphylaxis, Dermatitis and Rash   Banana Hives and Nausea And Vomiting   Jardiance  [Empagliflozin ] Other (See Comments)    Yeast infections, fatigue   Penicillins Rash   Social History   Socioeconomic History   Marital status: Divorced    Spouse name: Not on file   Number of children: Not on file   Years of education: Not on file   Highest education level: Not on file  Occupational History   Not on file  Tobacco Use   Smoking status: Former    Current packs/day: 0.00    Average packs/day: 0.3  packs/day for 30.0 years (7.5 ttl pk-yrs)    Types: Cigarettes    Start date: 02/16/1981    Quit date: 02/17/2011    Years since quitting: 12.9   Smokeless tobacco: Never  Vaping Use   Vaping status: Never Used  Substance and Sexual Activity   Alcohol use: Not Currently    Comment: rare   Drug use: No   Sexual activity: Not Currently    Birth control/protection: Surgical  Other Topics Concern   Not on file  Social History Narrative   Pt lives with daughter    Retired    Social Drivers of Health   Tobacco Use: Medium Risk (12/21/2023)   Patient History    Smoking Tobacco Use: Former    Smokeless Tobacco Use: Never    Passive Exposure: Not on Actuary Strain: Not on file  Food Insecurity: No Food Insecurity (12/21/2023)   Epic    Worried About Programme Researcher, Broadcasting/film/video in the Last Year: Never true    Ran Out of Food in the Last Year: Never true  Transportation Needs: No Transportation Needs (11/17/2023)   Epic    Lack of Transportation (Medical): No    Lack of Transportation (Non-Medical): No  Physical Activity: Not on file  Stress: Not on file  Social Connections: Moderately Isolated (11/17/2023)   Social Connection and Isolation Panel    Frequency of Communication with Friends and Family: More than three times a week    Frequency of Social Gatherings with Friends and Family: More than three  times a week    Attends Religious Services: 1 to 4 times per year    Active Member of [employer] or Organizations: No    Attends Banker Meetings: Never    Marital Status: Divorced  Catering Manager Violence: Not At Risk (12/22/2023)   Epic    Fear of Current or Ex-Partner: No    Emotionally Abused: No    Physically Abused: No    Sexually Abused: No  Depression (PHQ2-9): Low Risk (09/14/2023)   Depression (PHQ2-9)    PHQ-2 Score: 0  Alcohol Screen: Not on file  Housing: Unknown (12/21/2023)   Epic    Unable to Pay for Housing in the Last Year: No    Number of Times Moved in the Last Year: Not on file    Homeless in the Last Year: Not on file  Utilities: Not At Risk (11/17/2023)   Epic    Threatened with loss of utilities: No  Health Literacy: Not on file    Family History  Problem Relation Age of Onset   Hypertension Mother    Diabetes Mother    COPD Mother    Arthritis Mother    Diabetes Father    Arthritis Father    Dementia Father    CAD Father    Hypothyroidism Sister    Diabetes Brother    Stroke Paternal Aunt    Colon cancer Niece    Colon polyps Neg Hx    Sleep apnea Neg Hx     There were no vitals filed for this visit.   PHYSICAL EXAM: General:  elderly appearing.  No respiratory difficulty. Arrived in [facility] Neck: JVD flat.  Cor: Regular rate & rhythm. +MR  murmur. Lungs: clear, diminished bases Extremities: trace BLE edema  Neuro: alert & oriented x 3. Affect pleasant.   Wt Readings from Last 3 Encounters:  12/28/23 84.3 kg (185 lb 13.6 oz)  12/16/23 83.2 kg (183 lb  6.4 oz)  12/05/23 92.2 kg (203 lb 3.2 oz)    ECG: none today  ASSESSMENT & PLAN: 1. 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 Mild MR/TR - RHC RA 27, PA 48/26, PCW 22, PVR 2.2, TD CI 1.8, PAPi 0.81>>Likely end-stage physiology  - not candidate for transplant  given age, obesity and other co morbidities - not candidate for LVAD given severity of RV failure - NYHA III, confounded by deconditioning - Volume appears stable on exam. Weight down since discharge.  - Continue Torsemide  60 mg BID (80 daily prior to admit). Labs today.  - Continue spiro 25 mg daily - No beta blocker with low-output HF - GDMT limited by CKD - She is end-stage. Discharged to SNF with Palliative support but refuses Hospice currently.    2. CKD IIIb - Cr 2.11 12/05/23 - Baseline SCr ~ 1.9 - labs today   3. CAD  - coronary artery classifications by CT imaging - no plans currently for LHC given renal dysfunction and absence of ischemic CP  - No s/s angina - continue statin    4. HTN - BP slightly elevated but stable.     5. H/o recurrent LE DVT - on Eliquis . Denies abnormal bleeding.    6. Anemia/ Thrombocytopenia  Lower GI bleeding - suspect anemia of chronic disease d/t renal/hepatic dysfunction  - hgb slowly drifted down, now stable in 8s-9s - has been seen by GI, no plans for endoscopy - Continue Eliquis    7. Chronic liver disease - In setting of advanced biventricular heart failure  Follow upin 2 months with APP  Harlene HERO [facility] AGACNP-BC  02/02/2024  [facility] [address] [facility] [place] [address] [phone] (office)

## 2024-02-03 ENCOUNTER — Telehealth (HOSPITAL_COMMUNITY): Payer: Self-pay

## 2024-02-03 ENCOUNTER — Ambulatory Visit: Attending: Physician Assistant | Admitting: Physician Assistant

## 2024-02-03 NOTE — Telephone Encounter (Signed)
 Called to confirm/remind patient of their appointment at the [facility] on 02/04/24. However, pt rescheduled to another day.

## 2024-02-03 NOTE — Telephone Encounter (Signed)
 Per Patient's request  A reminder appointment letter and office directions was placed up front to be mailed. Pt aware, agreeable, and verbalized understanding.

## 2024-02-04 ENCOUNTER — Ambulatory Visit (HOSPITAL_COMMUNITY)

## 2024-02-14 ENCOUNTER — Encounter: Payer: Self-pay | Admitting: *Deleted

## 2024-02-16 ENCOUNTER — Inpatient Hospital Stay (HOSPITAL_COMMUNITY)
Admission: EM | Admit: 2024-02-16 | Discharge: 2024-02-25 | DRG: 291 | Disposition: A | Discharge status: Home / Self Care | Attending: Internal Medicine | Admitting: Internal Medicine

## 2024-02-16 ENCOUNTER — Emergency Department (HOSPITAL_COMMUNITY)

## 2024-02-16 ENCOUNTER — Other Ambulatory Visit: Payer: Self-pay

## 2024-02-16 ENCOUNTER — Encounter (HOSPITAL_COMMUNITY): Payer: Self-pay

## 2024-02-16 DIAGNOSIS — E039 Hypothyroidism, unspecified: Secondary | ICD-10-CM | POA: Diagnosis present

## 2024-02-16 DIAGNOSIS — R601 Generalized edema: Secondary | ICD-10-CM | POA: Diagnosis present

## 2024-02-16 DIAGNOSIS — I482 Chronic atrial fibrillation, unspecified: Secondary | ICD-10-CM | POA: Diagnosis not present

## 2024-02-16 DIAGNOSIS — I10 Essential (primary) hypertension: Secondary | ICD-10-CM | POA: Diagnosis present

## 2024-02-16 DIAGNOSIS — N1832 Chronic kidney disease, stage 3b: Secondary | ICD-10-CM | POA: Diagnosis present

## 2024-02-16 DIAGNOSIS — G894 Chronic pain syndrome: Secondary | ICD-10-CM | POA: Diagnosis present

## 2024-02-16 DIAGNOSIS — E66812 Obesity, class 2: Secondary | ICD-10-CM | POA: Diagnosis present

## 2024-02-16 DIAGNOSIS — E785 Hyperlipidemia, unspecified: Secondary | ICD-10-CM | POA: Diagnosis present

## 2024-02-16 DIAGNOSIS — M797 Fibromyalgia: Secondary | ICD-10-CM | POA: Diagnosis present

## 2024-02-16 DIAGNOSIS — I480 Paroxysmal atrial fibrillation: Secondary | ICD-10-CM | POA: Diagnosis present

## 2024-02-16 DIAGNOSIS — I5023 Acute on chronic systolic (congestive) heart failure: Secondary | ICD-10-CM | POA: Diagnosis not present

## 2024-02-16 DIAGNOSIS — Z888 Allergy status to other drugs, medicaments and biological substances status: Secondary | ICD-10-CM

## 2024-02-16 DIAGNOSIS — K7402 Hepatic fibrosis, advanced fibrosis: Secondary | ICD-10-CM | POA: Diagnosis present

## 2024-02-16 DIAGNOSIS — Z794 Long term (current) use of insulin: Secondary | ICD-10-CM

## 2024-02-16 DIAGNOSIS — J918 Pleural effusion in other conditions classified elsewhere: Secondary | ICD-10-CM | POA: Diagnosis present

## 2024-02-16 DIAGNOSIS — F32A Depression, unspecified: Secondary | ICD-10-CM | POA: Diagnosis present

## 2024-02-16 DIAGNOSIS — E1122 Type 2 diabetes mellitus with diabetic chronic kidney disease: Secondary | ICD-10-CM | POA: Diagnosis present

## 2024-02-16 DIAGNOSIS — I5043 Acute on chronic combined systolic (congestive) and diastolic (congestive) heart failure: Secondary | ICD-10-CM | POA: Diagnosis present

## 2024-02-16 DIAGNOSIS — Z825 Family history of asthma and other chronic lower respiratory diseases: Secondary | ICD-10-CM

## 2024-02-16 DIAGNOSIS — Z8249 Family history of ischemic heart disease and other diseases of the circulatory system: Secondary | ICD-10-CM

## 2024-02-16 DIAGNOSIS — E871 Hypo-osmolality and hyponatremia: Secondary | ICD-10-CM | POA: Diagnosis present

## 2024-02-16 DIAGNOSIS — Z823 Family history of stroke: Secondary | ICD-10-CM

## 2024-02-16 DIAGNOSIS — Z6836 Body mass index (BMI) 36.0-36.9, adult: Secondary | ICD-10-CM | POA: Diagnosis not present

## 2024-02-16 DIAGNOSIS — Z8719 Personal history of other diseases of the digestive system: Secondary | ICD-10-CM | POA: Diagnosis not present

## 2024-02-16 DIAGNOSIS — K7460 Unspecified cirrhosis of liver: Secondary | ICD-10-CM | POA: Diagnosis not present

## 2024-02-16 DIAGNOSIS — E1165 Type 2 diabetes mellitus with hyperglycemia: Secondary | ICD-10-CM | POA: Diagnosis present

## 2024-02-16 DIAGNOSIS — F419 Anxiety disorder, unspecified: Secondary | ICD-10-CM | POA: Diagnosis present

## 2024-02-16 DIAGNOSIS — E7800 Pure hypercholesterolemia, unspecified: Secondary | ICD-10-CM | POA: Diagnosis present

## 2024-02-16 DIAGNOSIS — Z87892 Personal history of anaphylaxis: Secondary | ICD-10-CM

## 2024-02-16 DIAGNOSIS — Z79899 Other long term (current) drug therapy: Secondary | ICD-10-CM

## 2024-02-16 DIAGNOSIS — K219 Gastro-esophageal reflux disease without esophagitis: Secondary | ICD-10-CM | POA: Diagnosis present

## 2024-02-16 DIAGNOSIS — Z800 Family history of malignant neoplasm of digestive organs: Secondary | ICD-10-CM

## 2024-02-16 DIAGNOSIS — E872 Acidosis, unspecified: Secondary | ICD-10-CM | POA: Diagnosis present

## 2024-02-16 DIAGNOSIS — Z8673 Personal history of transient ischemic attack (TIA), and cerebral infarction without residual deficits: Secondary | ICD-10-CM

## 2024-02-16 DIAGNOSIS — J449 Chronic obstructive pulmonary disease, unspecified: Secondary | ICD-10-CM | POA: Diagnosis present

## 2024-02-16 DIAGNOSIS — Z90711 Acquired absence of uterus with remaining cervical stump: Secondary | ICD-10-CM

## 2024-02-16 DIAGNOSIS — Z86718 Personal history of other venous thrombosis and embolism: Secondary | ICD-10-CM

## 2024-02-16 DIAGNOSIS — R0609 Other forms of dyspnea: Secondary | ICD-10-CM | POA: Diagnosis present

## 2024-02-16 DIAGNOSIS — Z880 Allergy status to penicillin: Secondary | ICD-10-CM

## 2024-02-16 DIAGNOSIS — I5082 Biventricular heart failure: Secondary | ICD-10-CM | POA: Diagnosis present

## 2024-02-16 DIAGNOSIS — I4891 Unspecified atrial fibrillation: Secondary | ICD-10-CM | POA: Diagnosis present

## 2024-02-16 DIAGNOSIS — Z515 Encounter for palliative care: Secondary | ICD-10-CM | POA: Diagnosis not present

## 2024-02-16 DIAGNOSIS — R188 Other ascites: Secondary | ICD-10-CM | POA: Diagnosis present

## 2024-02-16 DIAGNOSIS — Z96611 Presence of right artificial shoulder joint: Secondary | ICD-10-CM | POA: Diagnosis present

## 2024-02-16 DIAGNOSIS — J9611 Chronic respiratory failure with hypoxia: Secondary | ICD-10-CM | POA: Diagnosis not present

## 2024-02-16 DIAGNOSIS — Z7901 Long term (current) use of anticoagulants: Secondary | ICD-10-CM

## 2024-02-16 DIAGNOSIS — J4489 Other specified chronic obstructive pulmonary disease: Secondary | ICD-10-CM | POA: Diagnosis present

## 2024-02-16 DIAGNOSIS — Z881 Allergy status to other antibiotic agents status: Secondary | ICD-10-CM

## 2024-02-16 DIAGNOSIS — D631 Anemia in chronic kidney disease: Secondary | ICD-10-CM | POA: Diagnosis present

## 2024-02-16 DIAGNOSIS — Z7985 Long-term (current) use of injectable non-insulin antidiabetic drugs: Secondary | ICD-10-CM

## 2024-02-16 DIAGNOSIS — Z9049 Acquired absence of other specified parts of digestive tract: Secondary | ICD-10-CM

## 2024-02-16 DIAGNOSIS — Z79890 Hormone replacement therapy (postmenopausal): Secondary | ICD-10-CM

## 2024-02-16 DIAGNOSIS — Z833 Family history of diabetes mellitus: Secondary | ICD-10-CM

## 2024-02-16 DIAGNOSIS — I5021 Acute systolic (congestive) heart failure: Secondary | ICD-10-CM | POA: Diagnosis not present

## 2024-02-16 DIAGNOSIS — K922 Gastrointestinal hemorrhage, unspecified: Secondary | ICD-10-CM | POA: Diagnosis present

## 2024-02-16 DIAGNOSIS — Z8261 Family history of arthritis: Secondary | ICD-10-CM

## 2024-02-16 DIAGNOSIS — I130 Hypertensive heart and chronic kidney disease with heart failure and stage 1 through stage 4 chronic kidney disease, or unspecified chronic kidney disease: Secondary | ICD-10-CM | POA: Diagnosis present

## 2024-02-16 DIAGNOSIS — I5020 Unspecified systolic (congestive) heart failure: Secondary | ICD-10-CM | POA: Diagnosis not present

## 2024-02-16 DIAGNOSIS — Z87891 Personal history of nicotine dependence: Secondary | ICD-10-CM

## 2024-02-16 DIAGNOSIS — D472 Monoclonal gammopathy: Secondary | ICD-10-CM | POA: Diagnosis present

## 2024-02-16 DIAGNOSIS — E1169 Type 2 diabetes mellitus with other specified complication: Secondary | ICD-10-CM | POA: Diagnosis present

## 2024-02-16 LAB — CBC WITH DIFFERENTIAL/PLATELET
Abs Immature Granulocytes: 0.04 K/uL (ref 0.00–0.07)
Basophils Absolute: 0 K/uL (ref 0.0–0.1)
Basophils Relative: 0 %
Eosinophils Absolute: 0.1 K/uL (ref 0.0–0.5)
Eosinophils Relative: 1 %
HCT: 39.7 % (ref 36.0–46.0)
Hemoglobin: 12.4 g/dL (ref 12.0–15.0)
Immature Granulocytes: 0 %
Lymphocytes Relative: 13 %
Lymphs Abs: 1.2 K/uL (ref 0.7–4.0)
MCH: 25 pg — ABNORMAL LOW (ref 26.0–34.0)
MCHC: 31.2 g/dL (ref 30.0–36.0)
MCV: 80 fL (ref 80.0–100.0)
Monocytes Absolute: 0.7 K/uL (ref 0.1–1.0)
Monocytes Relative: 7 %
Neutro Abs: 7.2 K/uL (ref 1.7–7.7)
Neutrophils Relative %: 79 %
Platelets: 317 K/uL (ref 150–400)
RBC: 4.96 MIL/uL (ref 3.87–5.11)
RDW: 15.7 % — ABNORMAL HIGH (ref 11.5–15.5)
WBC: 9.2 K/uL (ref 4.0–10.5)
nRBC: 0 % (ref 0.0–0.2)

## 2024-02-16 LAB — COMPREHENSIVE METABOLIC PANEL WITH GFR
ALT: 26 U/L (ref 0–44)
AST: 48 U/L — ABNORMAL HIGH (ref 15–41)
Albumin: 3.3 g/dL — ABNORMAL LOW (ref 3.5–5.0)
Alkaline Phosphatase: 580 U/L — ABNORMAL HIGH (ref 38–126)
Anion gap: 15 (ref 5–15)
BUN: 31 mg/dL — ABNORMAL HIGH (ref 8–23)
CO2: 19 mmol/L — ABNORMAL LOW (ref 22–32)
Calcium: 9.1 mg/dL (ref 8.9–10.3)
Chloride: 93 mmol/L — ABNORMAL LOW (ref 98–111)
Creatinine, Ser: 1.36 mg/dL — ABNORMAL HIGH (ref 0.44–1.00)
GFR, Estimated: 41 mL/min — ABNORMAL LOW
Glucose, Bld: 196 mg/dL — ABNORMAL HIGH (ref 70–99)
Potassium: 4.3 mmol/L (ref 3.5–5.1)
Sodium: 127 mmol/L — ABNORMAL LOW (ref 135–145)
Total Bilirubin: 2.3 mg/dL — ABNORMAL HIGH (ref 0.0–1.2)
Total Protein: 9.2 g/dL — ABNORMAL HIGH (ref 6.5–8.1)

## 2024-02-16 LAB — PRO BRAIN NATRIURETIC PEPTIDE: Pro Brain Natriuretic Peptide: 15366 pg/mL — ABNORMAL HIGH

## 2024-02-16 LAB — GLUCOSE, CAPILLARY: Glucose-Capillary: 209 mg/dL — ABNORMAL HIGH (ref 70–99)

## 2024-02-16 MED ORDER — PROCHLORPERAZINE EDISYLATE 10 MG/2ML IJ SOLN: 5.0000 mg | Freq: Four times a day (QID) | INTRAMUSCULAR | Status: DC | PRN

## 2024-02-16 MED ORDER — ACETAMINOPHEN 500 MG PO TABS: 500.0000 mg | ORAL_TABLET | Freq: Four times a day (QID) | ORAL | Status: DC | PRN

## 2024-02-16 MED ORDER — APIXABAN 2.5 MG PO TABS
2.5000 mg | ORAL_TABLET | Freq: Two times a day (BID) | ORAL | Status: DC
  Filled 2024-02-16: qty 1

## 2024-02-16 MED ORDER — INSULIN ASPART 100 UNIT/ML IJ SOLN
0.0000 [IU] | Freq: Three times a day (TID) | INTRAMUSCULAR | Status: DC
  Administered 2024-02-17 (×2): 2 [IU] via SUBCUTANEOUS
  Administered 2024-02-17: 1 [IU] via SUBCUTANEOUS
  Administered 2024-02-18 (×2): 2 [IU] via SUBCUTANEOUS
  Administered 2024-02-18: 1 [IU] via SUBCUTANEOUS
  Administered 2024-02-19 – 2024-02-20 (×4): 2 [IU] via SUBCUTANEOUS
  Administered 2024-02-20: 1 [IU] via SUBCUTANEOUS
  Administered 2024-02-20: 2 [IU] via SUBCUTANEOUS
  Administered 2024-02-21 – 2024-02-22 (×3): 1 [IU] via SUBCUTANEOUS
  Administered 2024-02-22: 2 [IU] via SUBCUTANEOUS
  Administered 2024-02-23: 1 [IU] via SUBCUTANEOUS
  Administered 2024-02-23: 3 [IU] via SUBCUTANEOUS
  Administered 2024-02-23: 1 [IU] via SUBCUTANEOUS
  Administered 2024-02-24: 2 [IU] via SUBCUTANEOUS
  Administered 2024-02-25: 5 [IU] via SUBCUTANEOUS
  Administered 2024-02-25: 1 [IU] via SUBCUTANEOUS
  Filled 2024-02-16 (×20): qty 1

## 2024-02-16 MED ORDER — SPIRONOLACTONE 25 MG PO TABS
25.0000 mg | ORAL_TABLET | Freq: Every day | ORAL | Status: DC
  Administered 2024-02-17 – 2024-02-25 (×9): 25 mg via ORAL
  Filled 2024-02-16 (×9): qty 1

## 2024-02-16 MED ORDER — MELATONIN 5 MG PO TABS: 5.0000 mg | ORAL_TABLET | Freq: Every evening | ORAL | Status: DC | PRN

## 2024-02-16 MED ORDER — LEVOTHYROXINE SODIUM 100 MCG PO TABS
100.0000 ug | ORAL_TABLET | Freq: Every day | ORAL | Status: DC
  Administered 2024-02-17: 100 ug via ORAL
  Filled 2024-02-16: qty 1

## 2024-02-16 MED ORDER — POLYETHYLENE GLYCOL 3350 17 G PO PACK: 17.0000 g | PACK | Freq: Every day | ORAL | Status: DC | PRN

## 2024-02-16 MED ORDER — ENOXAPARIN SODIUM 40 MG/0.4ML IJ SOSY: 40.0000 mg | PREFILLED_SYRINGE | INTRAMUSCULAR | Status: DC

## 2024-02-16 MED ORDER — FUROSEMIDE 10 MG/ML IJ SOLN
80.0000 mg | Freq: Once | INTRAMUSCULAR | Status: AC
  Administered 2024-02-16: 80 mg via INTRAVENOUS
  Filled 2024-02-16: qty 8

## 2024-02-16 MED ORDER — INSULIN ASPART 100 UNIT/ML IJ SOLN
0.0000 [IU] | Freq: Every day | INTRAMUSCULAR | Status: DC
  Administered 2024-02-16: 2 [IU] via SUBCUTANEOUS
  Filled 2024-02-16: qty 1

## 2024-02-16 MED ORDER — ROSUVASTATIN CALCIUM 10 MG PO TABS
5.0000 mg | ORAL_TABLET | Freq: Every day | ORAL | Status: DC
  Administered 2024-02-17 – 2024-02-25 (×9): 5 mg via ORAL
  Filled 2024-02-16 (×9): qty 1

## 2024-02-16 MED ORDER — PANTOPRAZOLE SODIUM 40 MG PO TBEC
40.0000 mg | DELAYED_RELEASE_TABLET | Freq: Two times a day (BID) | ORAL | Status: DC
  Administered 2024-02-16 – 2024-02-25 (×18): 40 mg via ORAL
  Filled 2024-02-16 (×18): qty 1

## 2024-02-16 MED ORDER — FUROSEMIDE 10 MG/ML IJ SOLN
40.0000 mg | Freq: Every day | INTRAMUSCULAR | Status: DC
  Administered 2024-02-17: 40 mg via INTRAVENOUS
  Filled 2024-02-16: qty 4

## 2024-02-16 NOTE — H&P (Signed)
 " History and Physical  Stephanie [name] FMW:[id number] DOB: [birth year] DOA: 02/16/2024  Referring physician: Dr. Suzette, EDP  PCP: [facility]  Outpatient Specialists: Cardiologist, medical oncology, endocrinology, pulmonary. Patient coming from: Home.  Chief Complaint: Shortness of breath.  HPI: Stephanie [name] is a 73 y.o. female with medical history significant for MGUS, chronic HFrEF, history of DVT (was taken off Eliquis  prior to admission due to history of GI bleed), CVA, CKD 3B, COPD/asthma, chronic hypoxia on 3-4 L [place] at baseline, fibromyalgia, GERD, type 2 diabetes, hypertension, hyperlipidemia, hypothyroidism, obesity, who presents to the ER with complaints of shortness of breath x 3 days and worsening.  Associated with generalized swelling.  Endorses compliance with home medications.  She does not weigh herself daily.  EMS was activated.  Upon EMS arrival, the patient was hypoxic with O2 saturation in the 66 to 70% on room air.  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  She was placed on 15 L nonrebreather and brought into the ER for further evaluation.  In the ER, tachycardic and tachypneic.  Chest x-ray showing left lung base airspace opacity and moderate left pleural effusion.  Mild pulmonary edema.  Stable cardiomegaly.  Right upper lobe pulmonary nodule measuring 7 mm.  Lab studies notable for proBNP greater than 15,000.  Twelve-lead EKG, atrial fibrillation with a rate of 108, QTc 534.  The patient received IV Lasix  80 mg x 1.  TRH, hospitalist service, was asked to admit for further management of acute on chronic HFrEF.  ED Course: Temperature 97.6.  BP 151/97, pulse 103, respiratory rate 23.  O2 saturation 67% on room air.  Review of Systems: Review of systems as noted in the HPI. All other systems reviewed and are negative.   Past Medical History:  Diagnosis Date   Anemia    Arthritis    Asthma    COPD (chronic obstructive pulmonary disease) (HCC)    COVID-19    Deep vein thrombosis  (DVT) of both lower extremities (HCC) 06/27/2015   Fibromyalgia    GERD (gastroesophageal reflux disease)    H/O hiatal hernia    Hypercholesteremia    Hypertension    Hyperthyroidism    IBS (irritable bowel syndrome)    Inappropriate sinus tachycardia    Inner ear disease    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   Type 2 diabetes mellitus (HCC)    Past Surgical History:  Procedure Laterality Date   ABDOMINAL HYSTERECTOMY  partial   CARPAL TUNNEL RELEASE Right 1991   CATARACT EXTRACTION W/PHACO Right 05/08/2013   Procedure: CATARACT EXTRACTION PHACO AND INTRAOCULAR LENS PLACEMENT (IOC);  Surgeon: Cherene Mania, MD;  Location: [facility];  Service: Ophthalmology;  Laterality: Right;  CDE 10.31   CATARACT EXTRACTION W/PHACO Left 08/17/2013   Procedure: CATARACT EXTRACTION PHACO AND INTRAOCULAR LENS PLACEMENT (IOC);  Surgeon: Cherene Mania, MD;  Location: [facility];  Service: Ophthalmology;  Laterality: Left;  CDE:9.03   CHOLECYSTECTOMY  1971   COLONOSCOPY WITH PROPOFOL  N/A 01/06/2016   Dr. Shaaron: diverticulosis    DENTAL SURGERY     ESOPHAGEAL BRUSHING  08/29/2019   Procedure: ESOPHAGEAL BRUSHING;  Surgeon: Shaaron Lamar HERO, MD;  Location: [facility];  Service: Endoscopy;;   ESOPHAGOGASTRODUODENOSCOPY (EGD) WITH PROPOFOL  N/A 01/06/2016   Dr. Shaaron: normal s/p empiric dilation    ESOPHAGOGASTRODUODENOSCOPY (EGD) WITH PROPOFOL  N/A 08/29/2019   esophageal plaques vs medication residue adherent to tubular esophagus s/p KOH brushing and dilation. Medium-sized hiatal hernia. + for candida. Diflucan .  EYE SURGERY     IR BONE MARROW BIOPSY & ASPIRATION  03/31/2022   MALONEY DILATION N/A 01/06/2016   Procedure: AGAPITO DILATION;  Surgeon: Lamar CHRISTELLA Hollingshead, MD;  Location: [facility];  Service: Endoscopy;  Laterality: N/A;   MALONEY DILATION N/A 08/29/2019   Procedure: AGAPITO DILATION;  Surgeon: Hollingshead Lamar CHRISTELLA, MD;  Location: [facility];  Service: Endoscopy;  Laterality: N/A;    REVERSE SHOULDER ARTHROPLASTY Right 08/06/2017   Procedure: RIGHT REVERSE SHOULDER ARTHROPLASTY;  Surgeon: Kay Kemps, MD;  Location: [facility];  Service: Orthopedics;  Laterality: Right;   RIGHT HEART CATH N/A 11/26/2023   Procedure: RIGHT HEART CATH;  Surgeon: Cherrie Toribio SAUNDERS, MD;  Location: [facility];  Service: Cardiovascular;  Laterality: N/A;   WRIST GANGLION EXCISION Left     Social History:  reports that she quit smoking about 13 years ago. Her smoking use included cigarettes. She started smoking about 43 years ago. She has a 7.5 pack-year smoking history. She has never used smokeless tobacco. She reports that she does not currently use alcohol. She reports that she does not use drugs.   Allergies[1]  Family History  Problem Relation Age of Onset   Hypertension Mother    Diabetes Mother    COPD Mother    Arthritis Mother    Diabetes Father    Arthritis Father    Dementia Father    CAD Father    Hypothyroidism Sister    Diabetes Brother    Stroke Paternal Aunt    Colon cancer Niece    Colon polyps Neg Hx    Sleep apnea Neg Hx       Prior to Admission medications  Medication Sig Start Date End Date Taking? Authorizing Provider  Accu-Chek FastClix Lancets MISC Use 1 lancet to check blood glucose four (4) times daily as directed by provider. 09/23/23   Therisa Benton PARAS, NP  acetaminophen  (TYLENOL ) 325 MG tablet Take 2 tablets (650 mg total) by mouth every 6 (six) hours as needed for mild pain (pain score 1-3) (or Fever >/= 101). 12/27/23   Shahmehdi, Adriana LABOR, MD  albuterol  (PROVENTIL ) (2.5 MG/3ML) 0.083% nebulizer solution Take 2.5 mg by nebulization every 6 (six) hours as needed for wheezing or shortness of breath.    [provider]  albuterol  (VENTOLIN  HFA) 108 (90 Base) MCG/ACT inhaler Inhale 1-2 puffs into the lungs every 6 (six) hours as needed for wheezing or shortness of breath. 03/21/21   Stuart Vernell Norris, PA-C  Alcohol Swabs (ALCOHOL PADS) 70  % PADS SMARTSIG:Pledget(s) Topical 4 Times Daily 08/12/20   [provider]  aluminum-magnesium  hydroxide-simethicone  (MAALOX) 200-200-20 MG/5ML SUSP Take 5 mLs by mouth as needed (GERD).    [provider]  apixaban  (ELIQUIS ) 2.5 MG TABS tablet Take 1 tablet (2.5 mg total) by mouth 2 (two) times daily. 12/31/23   Shahmehdi, Adriana LABOR, MD  bisoprolol  (ZEBETA ) 5 MG tablet Take 1 tablet (5 mg total) by mouth daily. 12/28/23   Shahmehdi, Adriana LABOR, MD  Blood Glucose Calibration (ACCU-CHEK GUIDE CONTROL) LIQD See admin instructions. 07/02/20   [provider]  cholecalciferol  (VITAMIN D3) 25 MCG (1000 UNIT) tablet Take 3,000 Units by mouth daily.    [provider]  cimetidine  (TAGAMET ) 200 MG tablet Take 0.5 tablets (100 mg total) by mouth daily as needed. Patient taking differently: Take 100 mg by mouth daily as needed (heartburn). 03/22/20   Ricky Fines, MD  CINNAMON PO Take 2 capsules by  mouth 3 (three) times daily.    [provider]  clobetasol  ointment (TEMOVATE ) 0.05 % Apply 1 application  topically as needed (inflammation). 06/20/15   [provider]  Continuous Glucose Receiver (FREESTYLE LIBRE 3 READER) DEVI USE TO MONITOR GLUCOSE CONTINUOUSLY AS DIRECTED 06/15/23   Therisa Benton PARAS, NP  Continuous Glucose Sensor (FREESTYLE LIBRE 3 PLUS SENSOR) MISC USE TO MONITOR GLUCOSE CONTINUOUSLY AS DIRECTED. CHANGE SENSOR EVERY 15 DAYS. 06/17/23   Therisa Benton PARAS, NP  DULoxetine  (CYMBALTA ) 60 MG capsule Take 60 mg by mouth 2 (two) times daily.    [provider]  EASY COMFORT PEN NEEDLES 31G X 5 MM MISC INJECT IN SULIN EVERY DAY AS DIRECTED 06/02/21   [provider]  Evolocumab  (REPATHA  SURECLICK) 140 MG/ML SOAJ Inject 140 mg into the skin every 14 (fourteen) days. 03/18/23   Whitfield Raisin, NP  fluticasone  (FLONASE ) 50 MCG/ACT nasal spray Place 2 sprays into both nostrils as needed (congestion). 04/05/23   [provider]  glucose  blood (ACCU-CHEK GUIDE TEST) test strip Use 1 test strip four(4) times daily to monitor blood glucose as directed by provider. 09/23/23   Therisa Benton PARAS, NP  insulin  glargine (LANTUS ) 100 UNIT/ML injection Inject 15 Units into the skin at bedtime. 2200    [provider]  lactulose  (CHRONULAC ) 10 GM/15ML solution Take 30 mLs (20 g total) by mouth 3 (three) times daily. 12/27/23   ShahmehdiAdriana LABOR, MD  Lancet Devices (EASY MINI EJECT LANCING DEVICE) MISC 4 (four) times daily. 07/02/20   [provider]  levothyroxine  (SYNTHROID ) 100 MCG tablet Take 1 tablet (100 mcg total) by mouth daily before breakfast. 10/21/23   Therisa Benton PARAS, NP  lidocaine  (LIDODERM ) 5 % Place 1 patch onto the skin daily. Remove & Discard patch within 12 hours or as directed by MD 01/29/22   Leath-Warren, Etta PARAS, NP  pantoprazole  (PROTONIX ) 40 MG tablet Take 1 tablet (40 mg total) by mouth 2 (two) times daily. 12/27/23 01/26/24  ShahmehdiAdriana LABOR, MD  potassium chloride  SA (KLOR-CON  M) 20 MEQ tablet Take 20 mEq by mouth once. 12/21/23 12/21/23  [provider]  pregabalin  (LYRICA ) 75 MG capsule Take 1 capsule (75 mg total) by mouth daily. 12/07/23   Arrien, Elidia Sieving, MD  RESTASIS  0.05 % ophthalmic emulsion Place 1 drop into both eyes 2 (two) times daily. 08/21/20   [provider]  rosuvastatin  (CRESTOR ) 5 MG tablet Take 1 tablet (5 mg total) by mouth daily. 12/27/23 12/26/24  Willette Adriana LABOR, MD  Skin Protectants, Misc. (INTERDRY 89K855) SHEE Change pad daily or if soiled 10/21/23   Therisa Benton PARAS, NP  spironolactone  (ALDACTONE ) 25 MG tablet Take 25 mg by mouth daily.    [provider]  tirzepatide  (MOUNJARO ) 12.5 MG/0.5ML Pen Inject 12.5 mg into the skin once a week. Patient taking differently: Inject 12.5 mg into the skin every Monday. 06/14/23   Therisa Benton PARAS, NP  torsemide  40 MG TABS Take 40 mg by mouth daily. 12/29/23   Shahmehdi, Adriana LABOR, MD  traZODone   (DESYREL ) 150 MG tablet Take 150 mg by mouth every evening. 1900 04/07/22   [provider]  ursodiol  (ACTIGALL ) 300 MG capsule Take 300 mg by mouth 2 (two) times daily.    [provider]    Physical Exam: BP (!) 166/98 (BP Location: Left Arm)   Pulse (!) 103   Temp 98.2 F (36.8 C) (Oral)   Resp 18   Ht 5'  8 (1.727 m)   SpO2 100%   BMI 28.26 kg/m   General: 73 y.o. year-old female well developed well nourished in no acute distress.  Alert and oriented x3. Cardiovascular: Regular rate and rhythm with no rubs or gallops.  No thyromegaly or JVD noted.  Anasarcic. Respiratory: Clear to auscultation with no wheezes or rales. Good inspiratory effort. Abdomen: Soft nontender nondistended with normal bowel sounds x4 quadrants. Muskuloskeletal: No cyanosis or clubbing noted bilaterally.  Severely volume overload. Neuro: CN II-XII intact, strength, sensation, reflexes Skin: No ulcerative lesions noted or rashes Psychiatry: Judgement and insight appear normal. Mood is appropriate for condition and setting          Labs on Admission:  Basic Metabolic Panel: Recent Labs  Lab 02/16/24 1728  NA 127*  K 4.3  CL 93*  CO2 19*  GLUCOSE 196*  BUN 31*  CREATININE 1.36*  CALCIUM  9.1   Liver Function Tests: Recent Labs  Lab 02/16/24 1728  AST 48*  ALT 26  ALKPHOS 580*  BILITOT 2.3*  PROT 9.2*  ALBUMIN  3.3*   No results for input(s): LIPASE, AMYLASE in the last 168 hours. No results for input(s): AMMONIA in the last 168 hours. CBC: Recent Labs  Lab 02/16/24 1728  WBC 9.2  NEUTROABS 7.2  HGB 12.4  HCT 39.7  MCV 80.0  PLT 317   Cardiac Enzymes: No results for input(s): CKTOTAL, CKMB, CKMBINDEX, TROPONINI in the last 168 hours.  BNP (last 3 results) Recent Labs    11/18/23 1745 12/16/23 1038  BNP 2,302.1* 1,088.4*    ProBNP (last 3 results) Recent Labs    11/16/23 1201 12/22/23 0423 02/16/24 1728  PROBNP 14,798.0* 14,392.0*  15,366.0*    CBG: Recent Labs  Lab 02/16/24 2147  GLUCAP 209*    Radiological Exams on Admission: DG Chest Port 1 View Result Date: 02/16/2024 EXAM: 1 VIEW(S) XRAY OF THE CHEST 02/16/2024 06:03:00 PM COMPARISON: 12/21/2023 CLINICAL HISTORY: sob FINDINGS: LUNGS AND PLEURA: Left lung base airspace opacity. Moderate left pleural effusion. Mild pulmonary edema. There is a right upper lobe pulmonary nodule measuring 7 mm. No pneumothorax. HEART AND MEDIASTINUM: Cardiomegaly, stable. BONES AND SOFT TISSUES: Right shoulder arthroplasty noted. IMPRESSION: 1. Left lung base airspace opacity and moderate left pleural effusion. 2. Mild pulmonary edema. 3. Stable cardiomegaly. 4. Right upper lobe pulmonary nodule measuring 7 mm. This can be further evaluated with non-emergent CT. Electronically signed by: Greig Pique MD 02/16/2024 07:17 PM EST RP Workstation: [id number]    EKG: I independently viewed the EKG done and my findings are as followed: Atrial fibrillation rate of 108.  QTc 534.  Assessment/Plan Present on Admission:  Acute on chronic systolic (congestive) heart failure (HCC)  Principal Problem:   Acute on chronic systolic (congestive) heart failure (HCC)  Acute on chronic HFrEF Presented with cardiomegaly, pulmonary edema, left pleural effusion, severely marked volume overload. Last 2D echo done on 11/17/2023 revealed LVEF 35 to 40% Continue diuresing Monitor strict I's and O's and daily weight Follow repeat transthoracic echocardiogram Consider cardiology consult in the morning.  Hypothyroidism Follow TSH Resume home levothyroxine   Type 2 diabetes with hyperglycemia Hemoglobin A1c 6.6 on 11/23/2023 Presented with serum glucose of 196 Heart healthy carb modified diet Insulin  coverage.  CKD 3B Renal function appears to be at baseline 1.26 with GFR of 41. Monitor urine output Repeat CMP in the morning.  Elevated liver chemistries Alkaline phosphatase vitamin D , AST 48, T.  bili 2.3 Repeat CMP Avoid hepatotoxic agents  Hypervolemic  hyponatremia Serum sodium 127 Continue diuresing Repeat BMP in the morning.  Non anion gap metabolic acidosis Serum bicarb 19, anion gap 15 Continue to monitor  GERD Resume home PPI  Hyperlipidemia Resume home Crestor   Obesity BMI 36 Recommend weight loss outpatient with regular physical activity and healthy dieting  History of DVT in 2017 Was taken off Eliquis  prior to admission due to history of GI bleed Continue subcu insulin  for DVT prophylaxis.  QTc prolongation. QTc 534 on admission 12 EKG Avoid QTc prolonging agents Optimize magnesium  and potassium levels.   Monitor on telemetry.  MGUS Outpatient follow-up with medical oncology.  Right upper lobe pulmonary nodule measuring 7 mm Outpatient follow-up.    Critical care time: 55 minutes.    DVT prophylaxis: Subcutaneous Lovenox  daily  Code Status: Full code.  Family Communication: None at present.  Disposition Plan: Admitted to telemetry unit.  Consults called: None.  Admission status: Inpatient status.   Status is: Inpatient The patient requires at least 2 midnights for further evaluation and treatment of present condition.   Terry LOISE Hurst MD [facility] Pager [phone]  If 7PM-7AM, please contact night-coverage [email or web address] Password [email or web address]  02/16/2024, 10:13 PM      [1]  Allergies Allergen Reactions   Tetracyclines & Related Anaphylaxis, Dermatitis and Rash   Banana Hives and Nausea And Vomiting   Jardiance  [Empagliflozin ] Other (See Comments)    Yeast infections, fatigue   Penicillins Rash   "

## 2024-02-16 NOTE — ED Triage Notes (Signed)
 From home hx CHF, worsening SOB 66-70% RA upon EMS arrival. Pt on 15L NRB, edema noted to legs and eyes. Pt denies missing any diuretics at home. Expiratory wheezes. 110/54 BP, 110.

## 2024-02-16 NOTE — ED Provider Notes (Signed)
 " [place] [facility] DEPART[facility] AT [facility] Provider Note   CSN: [id number] Arrival date & time: 02/16/24  1715     Patient presents with: Shortness of Breath   Stephanie [name] is a 73 y.o. female.  {Add pertinent medical, surgical, social history, OB history to YEP:67052} Patient has a history of congestive heart failure and COPD.  She complains of worsening swelling in her legs and shortness of breath   Shortness of Breath      Prior to Admission medications  Medication Sig Start Date End Date Taking? Authorizing Provider  Accu-Chek FastClix Lancets MISC Use 1 lancet to check blood glucose four (4) times daily as directed by provider. 09/23/23   Therisa Benton PARAS, NP  acetaminophen  (TYLENOL ) 325 MG tablet Take 2 tablets (650 mg total) by mouth every 6 (six) hours as needed for mild pain (pain score 1-3) (or Fever >/= 101). 12/27/23   Shahmehdi, Adriana LABOR, MD  albuterol  (PROVENTIL ) (2.5 MG/3ML) 0.083% nebulizer solution Take 2.5 mg by nebulization every 6 (six) hours as needed for wheezing or shortness of breath.    [provider]  albuterol  (VENTOLIN  HFA) 108 (90 Base) MCG/ACT inhaler Inhale 1-2 puffs into the lungs every 6 (six) hours as needed for wheezing or shortness of breath. 03/21/21   Stuart Vernell Norris, PA-C  Alcohol Swabs (ALCOHOL PADS) 70 % PADS SMARTSIG:Pledget(s) Topical 4 Times Daily 08/12/20   [provider]  aluminum-magnesium  hydroxide-simethicone  (MAALOX) 200-200-20 MG/5ML SUSP Take 5 mLs by mouth as needed (GERD).    [provider]  apixaban  (ELIQUIS ) 2.5 MG TABS tablet Take 1 tablet (2.5 mg total) by mouth 2 (two) times daily. 12/31/23   Shahmehdi, Adriana LABOR, MD  bisoprolol  (ZEBETA ) 5 MG tablet Take 1 tablet (5 mg total) by mouth daily. 12/28/23   Shahmehdi, Adriana LABOR, MD  Blood Glucose Calibration (ACCU-CHEK GUIDE CONTROL) LIQD See admin instructions. 07/02/20   [provider]  cholecalciferol  (VITAMIN D3) 25 MCG (1000  UNIT) tablet Take 3,000 Units by mouth daily.    [provider]  cimetidine  (TAGAMET ) 200 MG tablet Take 0.5 tablets (100 mg total) by mouth daily as needed. Patient taking differently: Take 100 mg by mouth daily as needed (heartburn). 03/22/20   Ricky Fines, MD  CINNAMON PO Take 2 capsules by mouth 3 (three) times daily.    [provider]  clobetasol  ointment (TEMOVATE ) 0.05 % Apply 1 application  topically as needed (inflammation). 06/20/15   [provider]  Continuous Glucose Receiver (FREESTYLE LIBRE 3 READER) DEVI USE TO MONITOR GLUCOSE CONTINUOUSLY AS DIRECTED 06/15/23   Therisa Benton PARAS, NP  Continuous Glucose Sensor (FREESTYLE LIBRE 3 PLUS SENSOR) MISC USE TO MONITOR GLUCOSE CONTINUOUSLY AS DIRECTED. CHANGE SENSOR EVERY 15 DAYS. 06/17/23   Therisa Benton PARAS, NP  DULoxetine  (CYMBALTA ) 60 MG capsule Take 60 mg by mouth 2 (two) times daily.    [provider]  EASY COMFORT PEN NEEDLES 31G X 5 MM MISC INJECT IN SULIN EVERY DAY AS DIRECTED 06/02/21   [provider]  Evolocumab  (REPATHA  SURECLICK) 140 MG/ML SOAJ Inject 140 mg into the skin every 14 (fourteen) days. 03/18/23   Whitfield Raisin, NP  fluticasone  (FLONASE ) 50 MCG/ACT nasal spray Place 2 sprays into both nostrils as needed (congestion). 04/05/23   [provider]  glucose blood (ACCU-CHEK GUIDE TEST) test strip Use 1 test strip four(4) times daily to monitor blood glucose as directed by provider. 09/23/23   Therisa Benton PARAS, NP  insulin  glargine (LANTUS ) 100 UNIT/ML injection Inject 15 Units into the skin at bedtime. 2200    [provider]  lactulose  (CHRONULAC ) 10 GM/15ML solution Take 30 mLs (20 g total) by mouth 3 (three) times daily. 12/27/23   ShahmehdiAdriana LABOR, MD  Lancet Devices (EASY MINI EJECT LANCING DEVICE) MISC 4 (four) times daily. 07/02/20   [provider]  levothyroxine  (SYNTHROID ) 100 MCG tablet Take 1 tablet (100 mcg total) by mouth daily before  breakfast. 10/21/23   Therisa Benton PARAS, NP  lidocaine  (LIDODERM ) 5 % Place 1 patch onto the skin daily. Remove & Discard patch within 12 hours or as directed by MD 01/29/22   Leath-Warren, Etta PARAS, NP  pantoprazole  (PROTONIX ) 40 MG tablet Take 1 tablet (40 mg total) by mouth 2 (two) times daily. 12/27/23 01/26/24  ShahmehdiAdriana LABOR, MD  potassium chloride  SA (KLOR-CON  M) 20 MEQ tablet Take 20 mEq by mouth once. 12/21/23 12/21/23  [provider]  pregabalin  (LYRICA ) 75 MG capsule Take 1 capsule (75 mg total) by mouth daily. 12/07/23   Arrien, Elidia Sieving, MD  RESTASIS  0.05 % ophthalmic emulsion Place 1 drop into both eyes 2 (two) times daily. 08/21/20   [provider]  rosuvastatin  (CRESTOR ) 5 MG tablet Take 1 tablet (5 mg total) by mouth daily. 12/27/23 12/26/24  Willette Adriana LABOR, MD  Skin Protectants, Misc. (INTERDRY [id number]) SHEE Change pad daily or if soiled 10/21/23   Therisa Benton PARAS, NP  spironolactone  (ALDACTONE ) 25 MG tablet Take 25 mg by mouth daily.    [provider]  tirzepatide  (MOUNJARO ) 12.5 MG/0.5ML Pen Inject 12.5 mg into the skin once a week. Patient taking differently: Inject 12.5 mg into the skin every Monday. 06/14/23   Therisa Benton PARAS, NP  torsemide  40 MG TABS Take 40 mg by mouth daily. 12/29/23   Shahmehdi, Adriana LABOR, MD  traZODone  (DESYREL ) 150 MG tablet Take 150 mg by mouth every evening. 1900 04/07/22   [provider]  ursodiol  (ACTIGALL ) 300 MG capsule Take 300 mg by mouth 2 (two) times daily.    [provider]    Allergies: Tetracyclines & related, Banana, Jardiance  [empagliflozin ], and Penicillins    Review of Systems  Respiratory:  Positive for shortness of breath.     Updated Vital Signs BP (!) 149/91   Pulse 97   Temp (!) 96.9 F (36.1 C) (Axillary)   Resp 20   Ht 5' 8 (1.727 m)   SpO2 96%   BMI 28.26 kg/m   Physical Exam  (all labs ordered are listed, but only abnormal results are  displayed) Labs Reviewed  CBC WITH DIFFERENTIAL/PLATELET - Abnormal; Notable for the following components:      Result Value   MCH 25.0 (*)    RDW 15.7 (*)    All other components within normal limits  COMPREHENSIVE METABOLIC PANEL WITH GFR - Abnormal; Notable for the following components:   Sodium 127 (*)    Chloride 93 (*)    CO2 19 (*)    Glucose, Bld 196 (*)    BUN 31 (*)    Creatinine, Ser 1.36 (*)    Total Protein 9.2 (*)    Albumin  3.3 (*)    AST 48 (*)    Alkaline Phosphatase 580 (*)    Total Bilirubin 2.3 (*)    GFR, Estimated 41 (*)    All other components within normal limits  PRO BRAIN NATRIURETIC PEPTIDE - Abnormal; Notable for the following components:  Pro Brain Natriuretic Peptide 15,366.0 (*)    All other components within normal limits    EKG: None  Radiology: DG Chest Port 1 View Result Date: 02/16/2024 EXAM: 1 VIEW(S) XRAY OF THE CHEST 02/16/2024 06:03:00 PM COMPARISON: 12/21/2023 CLINICAL HISTORY: sob FINDINGS: LUNGS AND PLEURA: Left lung base airspace opacity. Moderate left pleural effusion. Mild pulmonary edema. There is a right upper lobe pulmonary nodule measuring 7 mm. No pneumothorax. HEART AND MEDIASTINUM: Cardiomegaly, stable. BONES AND SOFT TISSUES: Right shoulder arthroplasty noted. IMPRESSION: 1. Left lung base airspace opacity and moderate left pleural effusion. 2. Mild pulmonary edema. 3. Stable cardiomegaly. 4. Right upper lobe pulmonary nodule measuring 7 mm. This can be further evaluated with non-emergent CT. Electronically signed by: Greig Pique MD 02/16/2024 07:17 PM EST RP Workstation: [id number]    {Document cardiac monitor, telemetry assessment procedure when appropriate:32947} Procedures   Medications Ordered in the ED  furosemide  (LASIX ) injection 80 mg (80 mg Intravenous Given 02/16/24 1747)      {Click here for ABCD2, HEART and other calculators REFRESH Note before signing:1}                              Medical Decision  Making Amount and/or Complexity of Data Reviewed Labs: ordered. Radiology: ordered. ECG/medicine tests: ordered.  Risk Prescription drug management. Decision regarding hospitalization.   Patient with anasarca.  She will be admitted to medicine  {Document critical care time when appropriate  Document review of labs and clinical decision tools ie CHADS2VASC2, etc  Document your independent review of radiology images and any outside records  Document your discussion with family members, caretakers and with consultants  Document social determinants of health affecting pt's care  Document your decision making why or why not admission, treatments were needed:32947:::1}   Final diagnoses:  Systolic congestive heart failure, unspecified HF chronicity (HCC)  Anasarca    ED Discharge Orders     None        "

## 2024-02-16 NOTE — Plan of Care (Signed)
" °  Problem: Clinical Measurements: Goal: Ability to maintain clinical measurements within normal limits will improve Outcome: Not Progressing   Problem: Clinical Measurements: Goal: Diagnostic test results will improve Outcome: Not Progressing   Problem: Clinical Measurements: Goal: Respiratory complications will improve Outcome: Not Progressing   Problem: Clinical Measurements: Goal: Cardiovascular complication will be avoided Outcome: Not Progressing   Problem: Skin Integrity: Goal: Risk for impaired skin integrity will decrease Outcome: Not Progressing   "

## 2024-02-17 ENCOUNTER — Encounter (HOSPITAL_COMMUNITY): Payer: Self-pay | Admitting: Internal Medicine

## 2024-02-17 ENCOUNTER — Inpatient Hospital Stay (HOSPITAL_COMMUNITY)

## 2024-02-17 ENCOUNTER — Other Ambulatory Visit (HOSPITAL_COMMUNITY): Payer: Self-pay | Admitting: *Deleted

## 2024-02-17 DIAGNOSIS — N1832 Chronic kidney disease, stage 3b: Secondary | ICD-10-CM | POA: Diagnosis not present

## 2024-02-17 DIAGNOSIS — I5021 Acute systolic (congestive) heart failure: Secondary | ICD-10-CM | POA: Diagnosis not present

## 2024-02-17 DIAGNOSIS — I5023 Acute on chronic systolic (congestive) heart failure: Secondary | ICD-10-CM | POA: Diagnosis not present

## 2024-02-17 DIAGNOSIS — E039 Hypothyroidism, unspecified: Secondary | ICD-10-CM | POA: Diagnosis not present

## 2024-02-17 DIAGNOSIS — K219 Gastro-esophageal reflux disease without esophagitis: Secondary | ICD-10-CM | POA: Diagnosis not present

## 2024-02-17 DIAGNOSIS — I4891 Unspecified atrial fibrillation: Secondary | ICD-10-CM | POA: Diagnosis not present

## 2024-02-17 LAB — GLUCOSE, CAPILLARY
Glucose-Capillary: 155 mg/dL — ABNORMAL HIGH (ref 70–99)
Glucose-Capillary: 168 mg/dL — ABNORMAL HIGH (ref 70–99)
Glucose-Capillary: 149 mg/dL — ABNORMAL HIGH (ref 70–99)
Glucose-Capillary: 162 mg/dL — ABNORMAL HIGH (ref 70–99)

## 2024-02-17 LAB — TSH: TSH: 6.11 u[IU]/mL — ABNORMAL HIGH (ref 0.350–4.500)

## 2024-02-17 MED ORDER — FUROSEMIDE 10 MG/ML IJ SOLN: 40.0000 mg | Freq: Two times a day (BID) | INTRAMUSCULAR | Status: DC

## 2024-02-17 MED ORDER — INSULIN GLARGINE 100 UNIT/ML ~~LOC~~ SOLN
10.0000 [IU] | Freq: Every day | SUBCUTANEOUS | Status: DC
  Administered 2024-02-17 – 2024-02-25 (×9): 10 [IU] via SUBCUTANEOUS
  Filled 2024-02-17 (×10): qty 0.1

## 2024-02-17 MED ORDER — TRAMADOL HCL 50 MG PO TABS: 50.0000 mg | ORAL_TABLET | Freq: Three times a day (TID) | ORAL | Status: DC | PRN

## 2024-02-17 MED ORDER — TRAZODONE HCL 50 MG PO TABS
150.0000 mg | ORAL_TABLET | Freq: Every evening | ORAL | Status: DC
  Administered 2024-02-17 – 2024-02-24 (×8): 150 mg via ORAL
  Filled 2024-02-17 (×8): qty 3

## 2024-02-17 MED ORDER — FUROSEMIDE 10 MG/ML IJ SOLN
40.0000 mg | Freq: Once | INTRAMUSCULAR | Status: AC
  Administered 2024-02-17: 40 mg via INTRAVENOUS
  Filled 2024-02-17: qty 4

## 2024-02-17 MED ORDER — PERFLUTREN LIPID MICROSPHERE
1.0000 mL | INTRAVENOUS | Status: AC | PRN
  Administered 2024-02-17: 3 mL via INTRAVENOUS

## 2024-02-17 MED ORDER — CYCLOSPORINE 0.05 % OP EMUL
1.0000 [drp] | Freq: Two times a day (BID) | OPHTHALMIC | Status: DC
  Administered 2024-02-17 – 2024-02-25 (×17): 1 [drp] via OPHTHALMIC
  Filled 2024-02-17 (×17): qty 30

## 2024-02-17 MED ORDER — URSODIOL 300 MG PO CAPS
300.0000 mg | ORAL_CAPSULE | Freq: Two times a day (BID) | ORAL | Status: DC
  Administered 2024-02-17 – 2024-02-25 (×16): 300 mg via ORAL
  Filled 2024-02-17 (×18): qty 1

## 2024-02-17 MED ORDER — ENOXAPARIN SODIUM 40 MG/0.4ML IJ SOSY
40.0000 mg | PREFILLED_SYRINGE | INTRAMUSCULAR | Status: DC
  Administered 2024-02-17 – 2024-02-25 (×8): 40 mg via SUBCUTANEOUS
  Filled 2024-02-17 (×9): qty 0.4

## 2024-02-17 MED ORDER — FUROSEMIDE 10 MG/ML IJ SOLN
40.0000 mg | Freq: Two times a day (BID) | INTRAMUSCULAR | Status: DC
  Administered 2024-02-18: 40 mg via INTRAVENOUS
  Filled 2024-02-17: qty 4

## 2024-02-17 MED ORDER — LACTULOSE 10 GM/15ML PO SOLN
20.0000 g | Freq: Two times a day (BID) | ORAL | Status: DC
  Administered 2024-02-17 – 2024-02-23 (×11): 20 g via ORAL
  Filled 2024-02-17 (×14): qty 30

## 2024-02-17 MED ORDER — BISOPROLOL FUMARATE 5 MG PO TABS
5.0000 mg | ORAL_TABLET | Freq: Every day | ORAL | Status: DC
  Administered 2024-02-17 – 2024-02-25 (×9): 5 mg via ORAL
  Filled 2024-02-17 (×9): qty 1

## 2024-02-17 MED ORDER — LACTULOSE 10 GM/15ML PO SOLN
20.0000 g | Freq: Three times a day (TID) | ORAL | Status: DC
  Administered 2024-02-17: 20 g via ORAL
  Filled 2024-02-17: qty 30

## 2024-02-17 MED ORDER — DULOXETINE HCL 60 MG PO CPEP
60.0000 mg | ORAL_CAPSULE | Freq: Two times a day (BID) | ORAL | Status: DC
  Administered 2024-02-17 – 2024-02-25 (×17): 60 mg via ORAL
  Filled 2024-02-17 (×17): qty 1

## 2024-02-17 MED ORDER — POTASSIUM CHLORIDE CRYS ER 20 MEQ PO TBCR
20.0000 meq | EXTENDED_RELEASE_TABLET | Freq: Every day | ORAL | Status: DC
  Administered 2024-02-17 – 2024-02-25 (×9): 20 meq via ORAL
  Filled 2024-02-17 (×8): qty 1
  Filled 2024-02-17: qty 2

## 2024-02-17 MED ORDER — LEVOTHYROXINE SODIUM 88 MCG PO TABS
88.0000 ug | ORAL_TABLET | Freq: Every day | ORAL | Status: DC
  Administered 2024-02-18 – 2024-02-25 (×8): 88 ug via ORAL
  Filled 2024-02-17 (×8): qty 1

## 2024-02-17 MED ORDER — ALBUTEROL SULFATE (2.5 MG/3ML) 0.083% IN NEBU: 2.5000 mg | INHALATION_SOLUTION | Freq: Four times a day (QID) | RESPIRATORY_TRACT | Status: DC | PRN

## 2024-02-17 MED ORDER — LIDOCAINE 5 % EX PTCH: 1.0000 | MEDICATED_PATCH | Freq: Every day | CUTANEOUS | Status: DC | PRN

## 2024-02-17 MED ORDER — METHOCARBAMOL 500 MG PO TABS
500.0000 mg | ORAL_TABLET | Freq: Three times a day (TID) | ORAL | Status: DC | PRN
  Administered 2024-02-19: 500 mg via ORAL
  Filled 2024-02-17: qty 1

## 2024-02-17 NOTE — Progress Notes (Signed)
*  PRELIMINARY RESULTS* Echocardiogram 2D Echocardiogram has been performed with Definity .  Stephanie [name] 02/17/2024, 10:45 AM

## 2024-02-17 NOTE — TOC Initial Note (Signed)
 Transition of Care [facility]) - Initial/Assessment Note    Patient Details  Name: Stephanie [name] MRN: [id number] Date of Birth: 06-01-1950  Transition of Care [facility]) CM/SW Contact:    Hoy DELENA Bigness, LCSW Phone Number: 02/17/2024, 11:48 AM  Clinical Narrative:                 Pt assessed due to high risk for readmission. Pt shares she lives at home w/ her daughter. Pt ambulates with RW but, also has wheelchair at home she uses when needed. Pt's daughter assists with ADL's and transportation. Pt is on 2-3L of O2 provided by [employer]. Pt denies having current HH services. ICM will continue to follow.   Expected Discharge Plan: Home/Self Care Barriers to Discharge: Continued Medical Work up   Patient Goals and CMS Choice Patient states their goals for this hospitalization and ongoing recovery are:: To return home          Expected Discharge Plan and Services In-house Referral: Clinical Social Work Discharge Planning Services: NA Post Acute Care Choice: NA Living arrangements for the past 2 months: Single Family Home                 DME Arranged: N/A DME Agency: NA                  Prior Living Arrangements/Services Living arrangements for the past 2 months: Single Family Home Lives with:: Adult Children Patient language and need for interpreter reviewed:: Yes Do you feel safe going back to the place where you live?: Yes      Need for Family Participation in Patient Care: No (Comment) Care giver support system in place?: Yes (comment) Current home services: DME (RW, wheelchair, BSC, O2 w/ [employer]) Criminal Activity/Legal Involvement Pertinent to Current Situation/Hospitalization: No - Comment as needed  Activities of Daily Living   ADL Screening (condition at time of admission) Independently performs ADLs?: No Does the patient have a NEW difficulty with bathing/dressing/toileting/self-feeding that is expected to last >3 days?: Yes (Initiates electronic notice to provider  for possible OT consult) Does the patient have a NEW difficulty with getting in/out of bed, walking, or climbing stairs that is expected to last >3 days?: Yes (Initiates electronic notice to provider for possible PT consult) Does the patient have a NEW difficulty with communication that is expected to last >3 days?: No Is the patient deaf or have difficulty hearing?: No Does the patient have difficulty seeing, even when wearing glasses/contacts?: No Does the patient have difficulty concentrating, remembering, or making decisions?: No  Permission Sought/Granted Permission sought to share information with : Facility Medical Sales Representative, Family Supports Permission granted to share information with : Yes, Verbal Permission Granted  Share Information with NAME: Dexheimer,Lewanda  Daughter, Emergency Contact  [phone]           Emotional Assessment Appearance:: Appears stated age Attitude/Demeanor/Rapport: Engaged Affect (typically observed): Accepting Orientation: : Oriented to Self, Oriented to Place, Oriented to  Time, Oriented to Situation Alcohol / Substance Use: Not Applicable Psych Involvement: No (comment)  Admission diagnosis:  Anasarca [R60.1] Acute on chronic systolic (congestive) heart failure (HCC) [I50.23] Systolic congestive heart failure, unspecified HF chronicity (HCC) [I50.20] Patient Active Problem List   Diagnosis Date Noted   Acute on chronic systolic (congestive) heart failure (HCC) 02/16/2024   Non-cardiac chest pain 12/27/2023   Acute blood loss anemia 12/27/2023   Chronic combined systolic and diastolic heart failure (HCC) 12/27/2023   Increased ammonia level 12/24/2023  Confusion 12/24/2023   Heme positive stool 12/24/2023   Encephalopathy, hepatic (HCC) 12/22/2023   GIB (gastrointestinal bleeding) 12/21/2023   Altered mental status, unspecified 12/21/2023   Depression 12/04/2023   History of CVA (cerebrovascular accident) 12/01/2023   COVID-19 01/09/2023    Right-sided chest pain 01/08/2023   Elevated troponin 01/08/2023   Chronic respiratory failure with hypoxia (HCC) 01/08/2023   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   Anxiety 12/23/2021   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) 11/25/2021   Hyperlipidemia 11/25/2021   Anemia due to chronic kidney disease 11/25/2021   Bilateral primary osteoarthritis of knee 09/15/2021   Acute on chronic combined systolic and diastolic CHF (congestive heart failure) (HCC) 05/20/2021   At risk for obstructive sleep apnea 05/20/2021   Protein-calorie malnutrition, moderate 05/17/2021   Cerebral infarction, unspecified (HCC) 05/17/2021   NASH (nonalcoholic steatohepatitis) 01/21/2021   Fibromyalgia 07/14/2020   [place]head (dyspnea on exertion) 06/18/2020   Chronic pain syndrome 04/10/2020   Lab test positive for detection of COVID-19 virus 03/17/2020   Pneumonia due to COVID-19 virus 03/11/2020   Acute respiratory disease due to COVID-19 virus 03/11/2020   Long term (current) use of anticoagulants 08/16/2018   TIA (transient ischemic attack) 09/15/2017   Localized osteoarthritis of right shoulder 08/08/2017   Acute embolism and thrombosis of unspecified deep veins of lower extremity, bilateral (HCC) 03/16/2017   Atrial fibrillation (HCC) 03/16/2017   Chronic kidney disease, stage 3b (HCC) 03/16/2017   Dysphagia 12/16/2015   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   Hyperglycemia due to type 2 diabetes mellitus (HCC) 12/09/2015   History of thromboembolism 11/08/2015   Constipation 10/27/2015   Elevated alkaline phosphatase level 07/23/2015   Alkaline phosphatase raised 07/23/2015   Hypoglycemia 07/01/2015   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   Maculopapular rash, generalized 06/27/2015   Alopecia 06/27/2015   Hypothyroidism 06/27/2015   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) 06/27/2015   Diverticulitis 10/19/2013   Former  cigarette smoker 10/19/2013   Obesity 10/19/2013   Type 2 diabetes mellitus with hyperlipidemia (HCC) 10/19/2013   Hypertension    Symptomatic anemia    GERD (gastroesophageal reflux disease)    COPD (chronic obstructive pulmonary disease) (HCC)    PCP:  [facility]:   [facility] - [place], [place] - [address] [place] [address] Phone: [phone] Fax: [phone]  [id number] - [place], [place] - [address] AT [facility] [address] [place] [address] Phone: [phone] Fax: [phone]     Social Drivers of Health (SDOH) Social History: SDOH Screenings   Food Insecurity: No Food Insecurity (02/16/2024)  Housing: Low Risk (02/16/2024)  Transportation Needs: No Transportation Needs (02/16/2024)  Utilities: Not At Risk (02/16/2024)  Depression (PHQ2-9): Low Risk (09/14/2023)  Social Connections: Moderately Isolated (02/16/2024)  Tobacco Use: Medium Risk (02/17/2024)   SDOH Interventions:     Readmission Risk Interventions    02/17/2024   11:46 AM 12/27/2023   10:08 AM 12/25/2023   10:02 AM  Readmission Risk Prevention Plan  Transportation Screening Complete Complete Complete  PCP or Specialist Appt within 3-5 Days Complete    HRI or Home Care Consult Complete    Social Work Consult for Recovery Care Planning/Counseling Complete    Palliative Care Screening Not Applicable    Medication Review Oceanographer) Complete Complete Complete  PCP or  Specialist appointment within 3-5 days of discharge  Complete   HRI or Home Care Consult  Complete Complete  SW Recovery Care/Counseling Consult  Complete Complete  Palliative Care Screening  Complete Not Applicable  Skilled Nursing Facility  Complete Complete

## 2024-02-17 NOTE — Progress Notes (Addendum)
 " PROGRESS NOTE   Stephanie [name]  FMW:[id number] DOB: 08/07/1950 DOA: 02/16/2024 PCP: [facility]   Chief Complaint  Patient presents with   Shortness of Breath   Level of care: Telemetry  Brief Admission History:  74 y.o. female with medical history significant for MGUS, chronic HFrEF, history of DVT (was taken off Eliquis  prior to admission due to history of GI bleed), CVA, CKD 3B, COPD/asthma, chronic hypoxia on 3-4 L [place] at baseline, fibromyalgia, GERD, type 2 diabetes, hypertension, hyperlipidemia, hypothyroidism, obesity, who presents to the ER with complaints of shortness of breath x 3 days and worsening.  Associated with generalized swelling.  Endorses compliance with home medications.  She does not weigh herself daily.  EMS was activated.  Upon EMS arrival, the patient was hypoxic with O2 saturation in the 66 to 70% on room air.  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  She was placed on 15 L nonrebreather and brought into the ER for further evaluation.   In the ER, pt was notably tachycardic and tachypneic.  Chest x-ray showed left lung base airspace opacity and moderate left pleural effusion.  Mild pulmonary edema.  Stable cardiomegaly.  Right upper lobe pulmonary nodule measuring 7 mm.  Lab studies notable for proBNP greater than 15,000.  Twelve-lead EKG, atrial fibrillation with a rate of 108, QTc 534.   The patient received IV Lasix  80 mg x 1.  TRH, hospitalist service, was asked to admit for further management of acute on chronic HFrEF.   ED Course: Temperature 97.6.  BP 151/97, pulse 103, respiratory rate 23.  O2 saturation 67% on room air.   Assessment and Plan:  Acute on chronic HFrEF Presented with cardiomegaly, pulmonary edema, left pleural effusion, severely marked volume overload. Last 2D echo done on 11/17/2023 revealed LVEF 35 to 40% Continue diuresing with IV furosemide  40 mg BID  Monitor strict I's and O's and daily weight Follow repeat transthoracic echocardiogram Cardiology  consult in the morning  Filed Weights   02/16/24 2133 02/17/24 0457  Weight: 108.1 kg 108 kg    Intake/Output Summary (Last 24 hours) at 02/17/2024 1137 Last data filed at 02/17/2024 9375 Gross per 24 hour  Intake --  Output 250 ml  Net -250 ml   Moderate left pleural effusion US  thoracentesis requested for symptom relief with fluid studies ordered  Hypothyroidism Follow TSH Resumed home levothyroxine    Type 2 diabetes with hyperglycemia Hemoglobin A1c 6.6 on 11/23/2023 Presented with serum glucose of 196 Heart healthy carb modified diet Supplemental sliding scale insulin  coverage. CBG (last 3)  Recent Labs    02/16/24 2147 02/17/24 0730  GLUCAP 209* 155*    CKD 3B Renal function appears to be at baseline 1.26 with GFR of 41. Monitor urine output Follow daily BMP    Advanced hepatic fibrosis Chronic hepatic fibrosis With chronic mildly abnormal LFTs, monitor GI recommended to continue lactulose , if patient refuses and recurrent AMS then start Xifaxan  550 mg BID  Elevated liver chemistries Alkaline phosphatase vitamin D , AST 48, T. bili 2.3 Avoid hepatotoxic agents Hepatic function panel in AM    Hypervolemic hyponatremia Serum sodium 127 Continue diuresing Repeat BMP daily while diuresing   Non anion gap metabolic acidosis Serum bicarb 19, anion gap 15 Continue to monitor   GERD Resume home PPI   Hyperlipidemia Resume home Crestor    Obesity BMI 36 Recommend weight loss outpatient with regular physical activity and healthy dieting   History of DVT in 2017 Was taken off Eliquis  prior  to admission due to history of GI bleed Continue subcu insulin  for DVT prophylaxis.   QTc prolongation. QTc 534 on admission 12 EKG Avoid QTc prolonging agents Optimize magnesium  and potassium levels.   Monitor on telemetry.   MGUS Outpatient follow-up with medical oncology.   Right upper lobe pulmonary nodule measuring 7 mm Outpatient follow-up.  DVT  prophylaxis: enoxaparin   Code Status: Full  Family Communication:  Disposition:    Consultants:  cardio Procedures:   Antimicrobials:    Subjective: Pt says she has only briefly started urinating on IV lasix . She remains edematous and fluid in legs and feet unchanged.   Objective: Vitals:   02/16/24 2133 02/17/24 0118 02/17/24 0351 02/17/24 0457  BP: (!) 166/98 (!) 151/97  (!) 160/93  Pulse: (!) 103 (!) 103  100  Resp: 18 20 14 18   Temp: 98.2 F (36.8 C) 97.6 F (36.4 C)  (!) 97.5 F (36.4 C)  TempSrc: Oral Oral  Oral  SpO2: 100% 98%  100%  Weight: 108.1 kg   108 kg  Height:        Intake/Output Summary (Last 24 hours) at 02/17/2024 1133 Last data filed at 02/17/2024 9375 Gross per 24 hour  Intake --  Output 250 ml  Net -250 ml   Filed Weights   02/16/24 2133 02/17/24 0457  Weight: 108.1 kg 108 kg   Examination:  General exam: Appears grossly volume overloaded;  calm and uncomfortable  Respiratory system: bibasilar crackles heard. Cardiovascular system: normal S1 & S2 heard. No JVD, murmurs, rubs, gallops or clicks. 2+ pedal edema. Gastrointestinal system: Abdomen is mildly distended, soft and nontender. No organomegaly or masses felt. Normal bowel sounds heard. Central nervous system: Alert and oriented. No focal neurological deficits. Extremities: 2+ pitting edema BLEs Symmetric 5 x 5 power. Skin: No rashes, lesions or ulcers. Psychiatry: Judgement and insight appear normal. Mood & affect appropriate.   Data Reviewed: I have personally reviewed following labs and imaging studies  CBC: Recent Labs  Lab 02/16/24 1728  WBC 9.2  NEUTROABS 7.2  HGB 12.4  HCT 39.7  MCV 80.0  PLT 317    Basic Metabolic Panel: Recent Labs  Lab 02/16/24 1728  NA 127*  K 4.3  CL 93*  CO2 19*  GLUCOSE 196*  BUN 31*  CREATININE 1.36*  CALCIUM  9.1    CBG: Recent Labs  Lab 02/16/24 2147 02/17/24 0730  GLUCAP 209* 155*    No results found for this or any previous  visit (from the past 240 hours).   Radiology Studies: DG Chest Port 1 View Result Date: 02/16/2024 EXAM: 1 VIEW(S) XRAY OF THE CHEST 02/16/2024 06:03:00 PM COMPARISON: 12/21/2023 CLINICAL HISTORY: sob FINDINGS: LUNGS AND PLEURA: Left lung base airspace opacity. Moderate left pleural effusion. Mild pulmonary edema. There is a right upper lobe pulmonary nodule measuring 7 mm. No pneumothorax. HEART AND MEDIASTINUM: Cardiomegaly, stable. BONES AND SOFT TISSUES: Right shoulder arthroplasty noted. IMPRESSION: 1. Left lung base airspace opacity and moderate left pleural effusion. 2. Mild pulmonary edema. 3. Stable cardiomegaly. 4. Right upper lobe pulmonary nodule measuring 7 mm. This can be further evaluated with non-emergent CT. Electronically signed by: Greig Pique MD 02/16/2024 07:17 PM EST RP Workstation: [id number]    Scheduled Meds:  bisoprolol   5 mg Oral Daily   enoxaparin  (LOVENOX ) injection  40 mg Subcutaneous Q24H   [START ON 02/18/2024] furosemide   40 mg Intravenous Q12H   insulin  aspart  0-5 Units Subcutaneous QHS   insulin  aspart  0-9  Units Subcutaneous TID WC   lactulose   20 g Oral TID   levothyroxine   100 mcg Oral QAC breakfast   pantoprazole   40 mg Oral BID   rosuvastatin   5 mg Oral Daily   spironolactone   25 mg Oral Daily   Continuous Infusions:   LOS: 1 day   Time spent: 58 mins  Aaryana Betke Vicci, MD How to contact the [facility] Attending or Consulting provider 7A - 7P or covering provider during after hours 7P -7A, for this patient?  Check the care team in [facility] and look for a) attending/consulting TRH provider listed and b) the TRH team listed Log into [email or web address] to find provider on call.  Locate the TRH provider you are looking for under [facility] and page to a number that you can be directly reached. If you still have difficulty reaching the provider, please page the [facility] (Director on Call) for the Hospitalists listed on amion for assistance.  02/17/2024, 11:33 AM     "

## 2024-02-17 NOTE — Progress Notes (Signed)
 Patient's tele monitor is reading 97 at the bedside.

## 2024-02-17 NOTE — Hospital Course (Signed)
 74 y.o. female with medical history significant for MGUS, chronic HFrEF, history of DVT (was taken off Eliquis  prior to admission due to history of GI bleed), CVA, CKD 3B, COPD/asthma, chronic hypoxia on 3-4 L [place] at baseline, fibromyalgia, GERD, type 2 diabetes, hypertension, hyperlipidemia, hypothyroidism, obesity, who presents to the ER with complaints of shortness of breath x 3 days and worsening.  Associated with generalized swelling.  Endorses compliance with home medications.  She does not weigh herself daily.  EMS was activated.  Upon EMS arrival, the patient was hypoxic with O2 saturation in the 66 to 70% on room air.  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  She was placed on 15 L nonrebreather and brought into the ER for further evaluation.   In the ER, pt was notably tachycardic and tachypneic.  Chest x-ray showed left lung base airspace opacity and moderate left pleural effusion.  Mild pulmonary edema.  Stable cardiomegaly.  Right upper lobe pulmonary nodule measuring 7 mm.  Lab studies notable for proBNP greater than 15,000.  Twelve-lead EKG, atrial fibrillation with a rate of 108, QTc 534.   The patient received IV Lasix  80 mg x 1.  TRH, hospitalist service, was asked to admit for further management of acute on chronic HFrEF.   ED Course: Temperature 97.6.  BP 151/97, pulse 103, respiratory rate 23.  O2 saturation 67% on room air.

## 2024-02-17 NOTE — Plan of Care (Signed)

## 2024-02-18 ENCOUNTER — Encounter (HOSPITAL_COMMUNITY): Payer: Self-pay | Admitting: Internal Medicine

## 2024-02-18 ENCOUNTER — Inpatient Hospital Stay (HOSPITAL_COMMUNITY)

## 2024-02-18 DIAGNOSIS — I5082 Biventricular heart failure: Secondary | ICD-10-CM

## 2024-02-18 DIAGNOSIS — Z8719 Personal history of other diseases of the digestive system: Secondary | ICD-10-CM | POA: Diagnosis not present

## 2024-02-18 DIAGNOSIS — K7460 Unspecified cirrhosis of liver: Secondary | ICD-10-CM | POA: Diagnosis not present

## 2024-02-18 DIAGNOSIS — I4891 Unspecified atrial fibrillation: Secondary | ICD-10-CM | POA: Diagnosis not present

## 2024-02-18 DIAGNOSIS — I5023 Acute on chronic systolic (congestive) heart failure: Secondary | ICD-10-CM | POA: Diagnosis not present

## 2024-02-18 LAB — GLUCOSE, CAPILLARY
Glucose-Capillary: 125 mg/dL — ABNORMAL HIGH (ref 70–99)
Glucose-Capillary: 155 mg/dL — ABNORMAL HIGH (ref 70–99)
Glucose-Capillary: 176 mg/dL — ABNORMAL HIGH (ref 70–99)
Glucose-Capillary: 164 mg/dL — ABNORMAL HIGH (ref 70–99)

## 2024-02-18 LAB — ECHOCARDIOGRAM COMPLETE
AR max vel: 2.4 cm2
AV Area VTI: 2.45 cm2
AV Area mean vel: 2.19 cm2
AV Mean grad: 3 mmHg
AV Peak grad: 4.8 mmHg
Ao pk vel: 1.09 m/s
Area-P 1/2: 5.43 cm2
Calc EF: 32.7 %
Height: 68 in
MV M vel: 4.02 m/s
MV Peak grad: 64.6 mmHg
S' Lateral: 3.9 cm
Single Plane A2C EF: 38.9 %
Single Plane A4C EF: 29.7 %
Weight: 3809.55 [oz_av]

## 2024-02-18 LAB — BASIC METABOLIC PANEL WITH GFR
Anion gap: 6 (ref 5–15)
BUN: 29 mg/dL — ABNORMAL HIGH (ref 8–23)
CO2: 28 mmol/L (ref 22–32)
Calcium: 8.8 mg/dL — ABNORMAL LOW (ref 8.9–10.3)
Chloride: 97 mmol/L — ABNORMAL LOW (ref 98–111)
Creatinine, Ser: 1.19 mg/dL — ABNORMAL HIGH (ref 0.44–1.00)
GFR, Estimated: 48 mL/min — ABNORMAL LOW
Glucose, Bld: 137 mg/dL — ABNORMAL HIGH (ref 70–99)
Potassium: 4.1 mmol/L (ref 3.5–5.1)
Sodium: 130 mmol/L — ABNORMAL LOW (ref 135–145)

## 2024-02-18 LAB — HEPATIC FUNCTION PANEL
ALT: 20 U/L (ref 0–44)
AST: 33 U/L (ref 15–41)
Albumin: 2.9 g/dL — ABNORMAL LOW (ref 3.5–5.0)
Alkaline Phosphatase: 505 U/L — ABNORMAL HIGH (ref 38–126)
Bilirubin, Direct: 1.3 mg/dL — ABNORMAL HIGH (ref 0.0–0.2)
Indirect Bilirubin: 0.6 mg/dL (ref 0.3–0.9)
Total Bilirubin: 1.9 mg/dL — ABNORMAL HIGH (ref 0.0–1.2)
Total Protein: 7.6 g/dL (ref 6.5–8.1)

## 2024-02-18 LAB — CBC
HCT: 33.3 % — ABNORMAL LOW (ref 36.0–46.0)
Hemoglobin: 10.6 g/dL — ABNORMAL LOW (ref 12.0–15.0)
MCH: 25.5 pg — ABNORMAL LOW (ref 26.0–34.0)
MCHC: 31.8 g/dL (ref 30.0–36.0)
MCV: 80.2 fL (ref 80.0–100.0)
Platelets: 219 K/uL (ref 150–400)
RBC: 4.15 MIL/uL (ref 3.87–5.11)
RDW: 15.7 % — ABNORMAL HIGH (ref 11.5–15.5)
WBC: 8.7 K/uL (ref 4.0–10.5)
nRBC: 0 % (ref 0.0–0.2)

## 2024-02-18 LAB — PRO BRAIN NATRIURETIC PEPTIDE: Pro Brain Natriuretic Peptide: 18227 pg/mL — ABNORMAL HIGH

## 2024-02-18 LAB — MAGNESIUM: Magnesium: 1.8 mg/dL (ref 1.7–2.4)

## 2024-02-18 MED ORDER — LIDOCAINE HCL (PF) 2 % IJ SOLN
10.0000 mL | Freq: Once | INTRAMUSCULAR | Status: AC
  Administered 2024-02-18: 10 mL

## 2024-02-18 MED ORDER — FUROSEMIDE 10 MG/ML IJ SOLN
80.0000 mg | Freq: Three times a day (TID) | INTRAMUSCULAR | Status: DC
  Administered 2024-02-18 – 2024-02-25 (×21): 80 mg via INTRAVENOUS
  Filled 2024-02-18 (×21): qty 8

## 2024-02-18 MED ORDER — FUROSEMIDE 10 MG/ML IJ SOLN: 80.0000 mg | Freq: Three times a day (TID) | INTRAMUSCULAR | Status: DC

## 2024-02-18 MED ORDER — LIDOCAINE HCL (PF) 2 % IJ SOLN
INTRAMUSCULAR | Status: AC
  Filled 2024-02-18: qty 10

## 2024-02-18 MED ORDER — FUROSEMIDE 10 MG/ML IJ SOLN
40.0000 mg | Freq: Once | INTRAMUSCULAR | Status: AC
  Administered 2024-02-18: 40 mg via INTRAVENOUS
  Filled 2024-02-18: qty 4

## 2024-02-18 MED ORDER — MAGNESIUM SULFATE 2 GM/50ML IV SOLN
2.0000 g | Freq: Once | INTRAVENOUS | Status: AC
  Administered 2024-02-18: 2 g via INTRAVENOUS
  Filled 2024-02-18: qty 50

## 2024-02-18 NOTE — TOC Progression Note (Signed)
 Transition of Care [facility]) - Progression Note    Patient Details  Name: Stephanie [name] MRN: [id number] Date of Birth: 1950-05-22  Transition of Care [facility]) CM/SW Contact  Hoy DELENA Bigness, LCSW Phone Number: 02/18/2024, 3:52 PM  Clinical Narrative:    Pt agreeable to recommendation for [facility] and does not have an agency preference at this time. Referrals have been sent in the hub.    Expected Discharge Plan: Home/Self Care Barriers to Discharge: Continued Medical Work up               Expected Discharge Plan and Services In-house Referral: Clinical Social Work Discharge Planning Services: NA Post Acute Care Choice: NA Living arrangements for the past 2 months: Single Family Home                 DME Arranged: N/A DME Agency: NA                   Social Drivers of Health (SDOH) Interventions SDOH Screenings   Food Insecurity: No Food Insecurity (02/16/2024)  Housing: Low Risk (02/16/2024)  Transportation Needs: No Transportation Needs (02/16/2024)  Utilities: Not At Risk (02/16/2024)  Depression (PHQ2-9): Low Risk (09/14/2023)  Social Connections: Moderately Isolated (02/16/2024)  Tobacco Use: Medium Risk (02/17/2024)    Readmission Risk Interventions    02/17/2024   11:46 AM 12/27/2023   10:08 AM 12/25/2023   10:02 AM  Readmission Risk Prevention Plan  Transportation Screening Complete Complete Complete  PCP or Specialist Appt within 3-5 Days Complete    HRI or Home Care Consult Complete    Social Work Consult for Recovery Care Planning/Counseling Complete    Palliative Care Screening Not Applicable    Medication Review Oceanographer) Complete Complete Complete  PCP or Specialist appointment within 3-5 days of discharge  Complete   HRI or Home Care Consult  Complete Complete  SW Recovery Care/Counseling Consult  Complete Complete  Palliative Care Screening  Complete Not Applicable  Skilled Nursing Facility  Complete Complete

## 2024-02-18 NOTE — Progress Notes (Signed)
 At 1005 patient went off floor for thoracentesis

## 2024-02-18 NOTE — Progress Notes (Signed)
 Patient tolerated left Thoracentesis procedure well today and 100 mL of clear yellow pleural fluid removed and sent to lab for processing. Patient verbalized understanding of post procedure instructions and transported to xray at this time via stretcher with no acute distress noted.

## 2024-02-18 NOTE — Procedures (Signed)
 PROCEDURE SUMMARY:  Successful image-guided left thoracentesis. Yielded 100 milliliters of clear yellow fluid. Small pleural effusion remains on post procedure ultrasound, however no further fluid was able to be removed despite multiple catheter manipulations.  Patient tolerated procedure well. EBL < 1 mL. No immediate complications.  Specimen was sent for labs. Post procedure CXR shows no pneumothorax.  Please see imaging section of Epic for full dictation.  Clotilda DELENA Hesselbach PA-C 02/18/2024 10:39 AM

## 2024-02-18 NOTE — Plan of Care (Signed)
" °  Problem: Acute Rehab PT Goals(only PT should resolve) Goal: Pt Will Go Supine/Side To Sit Outcome: Progressing Flowsheets (Taken 02/18/2024 1543) Pt will go Supine/Side to Sit:  with modified independence  with supervision Goal: Patient Will Transfer Sit To/From Stand Outcome: Progressing Flowsheets (Taken 02/18/2024 1543) Patient will transfer sit to/from stand:  with modified independence  with supervision Goal: Pt Will Transfer Bed To Chair/Chair To Bed Outcome: Progressing Flowsheets (Taken 02/18/2024 1543) Pt will Transfer Bed to Chair/Chair to Bed:  with modified independence  with supervision Goal: Pt Will Ambulate Outcome: Progressing Flowsheets (Taken 02/18/2024 1543) Pt will Ambulate:  25 feet  with supervision  with modified independence  with rolling walker   3:44 PM, 02/18/2024 Lynwood Music, MPT Physical Therapist with [facility] [phone] office [phone] mobile phone  "

## 2024-02-18 NOTE — Evaluation (Signed)
 Physical Therapy Evaluation Patient Details Name: Stephanie [name] MRN: [id number] DOB: 07-Apr-1950 Today's Date: 02/18/2024  History of Present Illness  Stephanie [name] is a 74 y.o. female with medical history significant for MGUS, chronic HFrEF, history of DVT (was taken off Eliquis  prior to admission due to history of GI bleed), CVA, CKD 3B, COPD/asthma, chronic hypoxia on 3-4 L [place] at baseline, fibromyalgia, GERD, type 2 diabetes, hypertension, hyperlipidemia, hypothyroidism, obesity, who presents to the ER with complaints of shortness of breath x 3 days and worsening.  Associated with generalized swelling.  Endorses compliance with home medications.  She does not weigh herself daily.  EMS was activated.  Upon EMS arrival, the patient was hypoxic with O2 saturation in the 66 to 70% on room air.  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  She was placed on 15 L nonrebreather and brought into the ER for further evaluation.   Clinical Impression  Patient demonstrates slow labored movement for sitting up at bedside, tolerated ambulating in room using RW without loss of balance, had difficulty transferring from low commode in bathroom due to BLE weakness, requested to go back to bed due to fatigue and demonstrates good return for using BUE/LE for repositioning self in bed. Patient will benefit from continued skilled physical therapy in hospital and recommended venue below to increase strength, balance, endurance for safe ADLs and gait.          If plan is discharge home, recommend the following: A little help with walking and/or transfers;A little help with bathing/dressing/bathroom;Help with stairs or ramp for entrance;Assist for transportation;Assistance with cooking/housework   Can travel by private vehicle        Equipment Recommendations None recommended by PT  Recommendations for Other Services       Functional Status Assessment Patient has had a recent decline in their functional status and demonstrates the ability to  make significant improvements in function in a reasonable and predictable amount of time.     Precautions / Restrictions Precautions Precautions: Fall Recall of Precautions/Restrictions: Intact Restrictions Weight Bearing Restrictions Per Provider Order: No      Mobility  Bed Mobility Overal bed mobility: Needs Assistance Bed Mobility: Supine to Sit, Sit to Supine     Supine to sit: Contact guard, Min assist Sit to supine: Min assist   General bed mobility comments: required assist for moving legs onto bed    Transfers Overall transfer level: Needs assistance Equipment used: Rolling walker (2 wheels) Transfers: Sit to/from Stand, Bed to chair/wheelchair/BSC Sit to Stand: Contact guard assist, Min assist   Step pivot transfers: Contact guard assist, Min assist       General transfer comment: had difficutly completing sit to stands from low commode due to weakness    Ambulation/Gait Ambulation/Gait assistance: Contact guard assist, Supervision Gait Distance (Feet): 16 Feet Assistive device: Rolling walker (2 wheels) Gait Pattern/deviations: Decreased step length - right, Decreased step length - left, Decreased stride length, Trunk flexed Gait velocity: decreased     General Gait Details: slow labored movement with flexed trunk, no loss of balance, limited mostly due to fatigue, on 3 LPM with SpO2 at 97-100%  Stairs            Wheelchair Mobility     Tilt Bed    Modified Rankin (Stroke Patients Only)       Balance Overall balance assessment: Needs assistance Sitting-balance support: Feet supported, No upper extremity supported Sitting balance-Leahy Scale: Good Sitting balance - Comments: seated at EOB  Standing balance support: Reliant on assistive device for balance, During functional activity, Bilateral upper extremity supported Standing balance-Leahy Scale: Fair Standing balance comment: using RW                              Pertinent Vitals/Pain Pain Assessment Pain Assessment: No/denies pain    Home Living Family/patient expects to be discharged to:: Private residence Living Arrangements: Children Available Help at Discharge: Family;Available PRN/intermittently Type of Home: House Home Access: Stairs to enter Entrance Stairs-Rails: None Entrance Stairs-Number of Steps: 1   Home Layout: One level Home Equipment: Agricultural Consultant (2 wheels);Cane - single point;BSC/3in1;Wheelchair - manual;Shower seat      Prior Function Prior Level of Function : Needs assist       Physical Assist : Mobility (physical);ADLs (physical) Mobility (physical): Bed mobility;Transfers;Gait;Stairs   Mobility Comments: household ambulation using RW ADLs Comments: Independent for household, assisted for community, does not drive     Extremity/Trunk Assessment   Upper Extremity Assessment Upper Extremity Assessment: Defer to OT evaluation    Lower Extremity Assessment Lower Extremity Assessment: Generalized weakness    Cervical / Trunk Assessment Cervical / Trunk Assessment: Kyphotic  Communication   Communication Communication: No apparent difficulties    Cognition Arousal: Alert Behavior During Therapy: WFL for tasks assessed/performed                             Following commands: Intact       Cueing Cueing Techniques: Verbal cues, Tactile cues     General Comments      Exercises     Assessment/Plan    PT Assessment Patient needs continued PT services  PT Problem List Decreased strength;Decreased activity tolerance;Decreased balance;Decreased mobility       PT Treatment Interventions DME instruction;Gait training;Stair training;Functional mobility training;Therapeutic activities;Therapeutic exercise;Balance training;Patient/family education    PT Goals (Current goals can be found in the Care Plan section)  Acute Rehab PT Goals Patient Stated Goal: return home with family to  assist PT Goal Formulation: With patient Time For Goal Achievement: 02/23/24 Potential to Achieve Goals: Good    Frequency Min 3X/week     Co-evaluation               AM-PAC PT 6 Clicks Mobility  Outcome Measure Help needed turning from your back to your side while in a flat bed without using bedrails?: A Little Help needed moving from lying on your back to sitting on the side of a flat bed without using bedrails?: A Little Help needed moving to and from a bed to a chair (including a wheelchair)?: A Little Help needed standing up from a chair using your arms (e.g., wheelchair or bedside chair)?: A Little Help needed to walk in hospital room?: A Little Help needed climbing 3-5 steps with a railing? : A Lot 6 Click Score: 17    End of Session Equipment Utilized During Treatment: Oxygen  Activity Tolerance: Patient tolerated treatment well;Patient limited by fatigue Patient left: in bed;with call bell/phone within reach Nurse Communication: Mobility status PT Visit Diagnosis: Unsteadiness on feet (R26.81);Other abnormalities of gait and mobility (R26.89);Muscle weakness (generalized) (M62.81)    Time: 8544-8474 PT Time Calculation (min) (ACUTE ONLY): 30 min   Charges:   PT Evaluation $PT Eval Moderate Complexity: 1 Mod PT Treatments $Therapeutic Activity: 23-37 mins PT General Charges $$ ACUTE PT VISIT: 1 Visit  3:42 PM, 02/18/2024 Lynwood Music, MPT Physical Therapist with [facility] [phone] office [phone] mobile phone

## 2024-02-18 NOTE — Consult Note (Addendum)
 "   CARDIOLOGY CONSULT NOTE    [name]; [id number]; [birth year]   Admit date: 02/16/2024 Date of Consult: 02/18/2024  Primary Care Provider: [facility] Primary Cardiologist:  Primary Electrophysiologist:    History of Present Illness:   Stephanie [name] is a 74 year old F known to have severe biventricular heart failure with end-stage physiology, CKD stage IIIb, HTN, DM 2 is currently admitted to hospitalist team for the management of acute on chronic systolic and diastolic heart failure.  Cardiology is consulted for the same.  On admission, proBNP was elevated, 15,366.  Serum creatinine was 1.3 on admission, improved to 1.19.  Total bilirubin was 2.3 on admission, improved to 1.9.  AST was 48 on admission, improved to 33.  Alkaline phosphatase was elevated.  CBC showed hemoglobin 12.4 admission, dropped to 10.6.  TSH was 6.110.  Chest x-ray showed imaging evidence of pulmonary vascular congestion/edema and left pleural effusion, now s/p thoracentesis.  Currently on IV Lasix  40 mg twice daily, 1.1 L urine output in the last 24 hours with net -0.3 L.  She reported worsening Ditullio, increased abdominal distention and leg swelling for the last 4 to 5 days.  SOB improved.  Leg swelling still there.  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   Type 2 diabetes mellitus (HCC)     Past Surgical History:  Procedure Laterality Date   ABDOMINAL HYSTERECTOMY  partial   CARPAL TUNNEL RELEASE Right 1991   CATARACT EXTRACTION  W/PHACO Right 05/08/2013   Procedure: CATARACT EXTRACTION PHACO AND INTRAOCULAR LENS PLACEMENT (IOC);  Surgeon: Cherene Mania, MD;  Location: [facility];  Service: Ophthalmology;  Laterality: Right;  CDE 10.31   CATARACT EXTRACTION W/PHACO Left 08/17/2013   Procedure: CATARACT EXTRACTION PHACO AND INTRAOCULAR LENS PLACEMENT (IOC);  Surgeon: Cherene Mania, MD;  Location: [facility];  Service: Ophthalmology;  Laterality: Left;  CDE:9.03   CHOLECYSTECTOMY  1971   COLONOSCOPY WITH PROPOFOL  N/A 01/06/2016   Dr. Shaaron: diverticulosis    DENTAL SURGERY     ESOPHAGEAL BRUSHING  08/29/2019   Procedure: ESOPHAGEAL BRUSHING;  Surgeon: Shaaron Lamar HERO, MD;  Location: [facility];  Service: Endoscopy;;   ESOPHAGOGASTRODUODENOSCOPY (EGD) WITH PROPOFOL  N/A 01/06/2016   Dr. Shaaron: normal s/p empiric dilation    ESOPHAGOGASTRODUODENOSCOPY (EGD) WITH PROPOFOL  N/A 08/29/2019   esophageal plaques vs medication residue adherent to tubular esophagus s/p KOH brushing and dilation. Medium-sized hiatal hernia. + for candida. Diflucan .    EYE SURGERY     IR BONE MARROW BIOPSY & ASPIRATION  03/31/2022   MALONEY DILATION N/A 01/06/2016   Procedure: AGAPITO DILATION;  Surgeon: Lamar HERO Shaaron, MD;  Location: [facility];  Service: Endoscopy;  Laterality: N/A;   MALONEY DILATION N/A 08/29/2019   Procedure: AGAPITO DILATION;  Surgeon: Shaaron Lamar HERO, MD;  Location: [facility];  Service: Endoscopy;  Laterality: N/A;   REVERSE SHOULDER ARTHROPLASTY Right 08/06/2017   Procedure: RIGHT REVERSE SHOULDER  ARTHROPLASTY;  Surgeon: Kay Kemps, MD;  Location: [facility];  Service: Orthopedics;  Laterality: Right;   RIGHT HEART CATH N/A 11/26/2023   Procedure: RIGHT HEART CATH;  Surgeon: Cherrie Toribio SAUNDERS, MD;  Location: [facility];  Service: Cardiovascular;  Laterality: N/A;   WRIST GANGLION EXCISION Left        Inpatient Medications: Scheduled Meds:  bisoprolol   5 mg Oral Daily   cycloSPORINE   1 drop Both Eyes BID   DULoxetine   60  mg Oral BID   enoxaparin  (LOVENOX ) injection  40 mg Subcutaneous Q24H   furosemide   40 mg Intravenous Q12H   insulin  aspart  0-5 Units Subcutaneous QHS   insulin  aspart  0-9 Units Subcutaneous TID WC   insulin  glargine  10 Units Subcutaneous Daily   lactulose   20 g Oral BID   levothyroxine   88 mcg Oral QAC breakfast   pantoprazole   40 mg Oral BID   potassium chloride  SA  20 mEq Oral Daily   rosuvastatin   5 mg Oral Daily   spironolactone   25 mg Oral Daily   traZODone   150 mg Oral QPM   ursodiol   300 mg Oral BID   Continuous Infusions:  magnesium  sulfate bolus IVPB     PRN Meds: acetaminophen , albuterol , lidocaine , melatonin, methocarbamol , polyethylene glycol, prochlorperazine , traMADol   Allergies:   Allergies[1]  Social History:   Social History   Socioeconomic History   Marital status: Divorced    Spouse name: Not on file   Number of children: Not on file   Years of education: Not on file   Highest education level: Not on file  Occupational History   Not on file  Tobacco Use   Smoking status: Former    Current packs/day: 0.00    Average packs/day: 0.3 packs/day for 30.0 years (7.5 ttl pk-yrs)    Types: Cigarettes    Start date: 02/16/1981    Quit date: 02/17/2011    Years since quitting: 13.0   Smokeless tobacco: Never  Vaping Use   Vaping status: Never Used  Substance and Sexual Activity   Alcohol use: Not Currently    Comment: rare   Drug use: No   Sexual activity: Not Currently    Birth control/protection: Surgical  Other Topics Concern   Not on file  Social History Narrative   Pt lives with daughter    Retired    Social Drivers of Health   Tobacco Use: Medium Risk (02/17/2024)   Patient History    Smoking Tobacco Use: Former    Smokeless Tobacco Use: Never    Passive Exposure: Not on Actuary Strain: Not on file  Food Insecurity: No Food Insecurity (02/16/2024)   Epic    Worried About Programme Researcher, Broadcasting/film/video in the Last Year: Never true     Ran Out of Food in the Last Year: Never true  Transportation Needs: No Transportation Needs (02/16/2024)   Epic    Lack of Transportation (Medical): No    Lack of Transportation (Non-Medical): No  Physical Activity: Not on file  Stress: Not on file  Social Connections: Moderately Isolated (02/16/2024)   Social Connection and Isolation Panel    Frequency of Communication with Friends and Family: More than three times a week    Frequency of Social Gatherings with Friends and Family: More than three times a week    Attends Religious Services: 1 to 4 times per year    Active Member of Clubs or Organizations: No  Attends Banker Meetings: Never    Marital Status: Divorced  Catering Manager Violence: Not At Risk (02/16/2024)   Epic    Fear of Current or Ex-Partner: No    Emotionally Abused: No    Physically Abused: No    Sexually Abused: No  Depression (PHQ2-9): Low Risk (09/14/2023)   Depression (PHQ2-9)    PHQ-2 Score: 0  Alcohol Screen: Not on file  Housing: Low Risk (02/16/2024)   Epic    Unable to Pay for Housing in the Last Year: No    Number of Times Moved in the Last Year: 0    Homeless in the Last Year: No  Utilities: Not At Risk (02/16/2024)   Epic    Threatened with loss of utilities: No  Health Literacy: Not on file    Family History:    Family History  Problem Relation Age of Onset   Hypertension Mother    Diabetes Mother    COPD Mother    Arthritis Mother    Diabetes Father    Arthritis Father    Dementia Father    CAD Father    Hypothyroidism Sister    Diabetes Brother    Stroke Paternal Aunt    Colon cancer Niece    Colon polyps Neg Hx    Sleep apnea Neg Hx      ROS:  Please see the history of present illness.  ROS  All other ROS reviewed and negative.     Physical Exam/Data:   Vitals:   02/18/24 1020 02/18/24 1030 02/18/24 1040 02/18/24 1125  BP: (!) 147/88 (!) 131/93  (!) 142/86  Pulse: 98 94 86 93  Resp: (!) 23 (!) 22 15 19    Temp: 98 F (36.7 C)   97.7 F (36.5 C)  TempSrc: Oral   Oral  SpO2: 100% 100% 100% 100%  Weight:      Height:        Intake/Output Summary (Last 24 hours) at 02/18/2024 1158 Last data filed at 02/18/2024 0931 Gross per 24 hour  Intake 960 ml  Output 1100 ml  Net -140 ml   Filed Weights   02/16/24 2133 02/17/24 0457 02/18/24 0519  Weight: 108.1 kg 108 kg 108.2 kg   Body mass index is 36.27 kg/m.  General:  Well nourished, well developed, in no acute distress HEENT: normal Lymph: no adenopathy Neck: JVD elevated Endocrine:  No thryomegaly Vascular: No carotid bruits; FA pulses 2+ bilaterally without bruits  Cardiac:  normal S1, S2; RRR; no murmur  Lungs:  clear to auscultation bilaterally, no wheezing, rhonchi or rales  Abd: soft, nontender, distended mildly Ext: 3+ pitting edema Musculoskeletal:  No deformities, BUE and BLE strength normal and equal Skin: warm and dry  Neuro:  CNs 2-12 intact, no focal abnormalities noted Psych:  Normal affect   Laboratory Data:  Chemistry Recent Labs  Lab 02/16/24 1728 02/18/24 0516  NA 127* 130*  K 4.3 4.1  CL 93* 97*  CO2 19* 28  GLUCOSE 196* 137*  BUN 31* 29*  CREATININE 1.36* 1.19*  CALCIUM  9.1 8.8*  GFRNONAA 41* 48*  ANIONGAP 15 6    Recent Labs  Lab 02/16/24 1728 02/18/24 0516  PROT 9.2* 7.6  ALBUMIN  3.3* 2.9*  AST 48* 33  ALT 26 20  ALKPHOS 580* 505*  BILITOT 2.3* 1.9*   Hematology Recent Labs  Lab 02/16/24 1728 02/18/24 0516  WBC 9.2 8.7  RBC 4.96 4.15  HGB 12.4 10.6*  HCT 39.7  33.3*  MCV 80.0 80.2  MCH 25.0* 25.5*  MCHC 31.2 31.8  RDW 15.7* 15.7*  PLT 317 219   Cardiac EnzymesNo results for input(s): TROPONINI in the last 168 hours. No results for input(s): TROPIPOC in the last 168 hours.  BNP Recent Labs  Lab 02/16/24 1728 02/18/24 0516  PROBNP 15,366.0* 18,227.0*    DDimer No results for input(s): DDIMER in the last 168 hours.  Radiology/Studies:  DG Chest 1 View Result Date:  02/18/2024 CLINICAL DATA:  Post thoracentesis. EXAM: CHEST  1 VIEW COMPARISON:  02/16/2024 FINDINGS: Diminished left pleural effusion post thoracentesis. No visible pneumothorax. Persistent opacity at the left lung base. Stable cardiomegaly. Generalized increase in bilateral perihilar opacities which may represent edema. IMPRESSION: 1. Diminished left pleural effusion post thoracentesis. No visible pneumothorax. 2. Persistent opacity at the left lung base. 3. Generalized increase in bilateral perihilar opacities which may represent edema. Electronically Signed   By: Andrea Gasman M.D.   On: 02/18/2024 11:52   US  THORACENTESIS ASP PLEURAL SPACE W/IMG GUIDE Result Date: 02/18/2024 INDICATION: Patient with history of congestive heart failure, found to have new left pleural effusion. Request for diagnostic and therapeutic thoracentesis. EXAM: ULTRASOUND GUIDED LEFT THORACENTESIS MEDICATIONS: 8 mL 1% lidocaine  COMPLICATIONS: None immediate. PROCEDURE: An ultrasound guided thoracentesis was thoroughly discussed with the patient and questions answered. The benefits, risks, alternatives and complications were also discussed. The patient understands and wishes to proceed with the procedure. Written consent was obtained. Ultrasound was performed to localize and mark an adequate pocket of fluid in the left chest. The area was then prepped and draped in the normal sterile fashion. 1% Lidocaine  was used for local anesthesia. Under ultrasound guidance a 6 Fr Safe-T-Centesis catheter was introduced. Thoracentesis was performed. The catheter was removed and a dressing applied. FINDINGS: A total of approximately 100 mL of fluid was removed. Samples were sent to the laboratory as requested by the clinical team. IMPRESSION: Successful ultrasound guided left thoracentesis yielding 100 mL of pleural fluid. Performed by Clotilda Hesselbach, PA-C Electronically Signed   By: Ester Sides M.D.   On: 02/18/2024 10:58   DG Chest Port 1  View Result Date: 02/16/2024 EXAM: 1 VIEW(S) XRAY OF THE CHEST 02/16/2024 06:03:00 PM COMPARISON: 12/21/2023 CLINICAL HISTORY: sob FINDINGS: LUNGS AND PLEURA: Left lung base airspace opacity. Moderate left pleural effusion. Mild pulmonary edema. There is a right upper lobe pulmonary nodule measuring 7 mm. No pneumothorax. HEART AND MEDIASTINUM: Cardiomegaly, stable. BONES AND SOFT TISSUES: Right shoulder arthroplasty noted. IMPRESSION: 1. Left lung base airspace opacity and moderate left pleural effusion. 2. Mild pulmonary edema. 3. Stable cardiomegaly. 4. Right upper lobe pulmonary nodule measuring 7 mm. This can be further evaluated with non-emergent CT. Electronically signed by: Greig Pique MD 02/16/2024 07:17 PM EST RP Workstation: [id number]    Assessment and Plan:   Severe biventricular heart failure, decompensated - Presented with worsening Severin, PND, abdominal distention, bilateral leg swelling x 4 to 5 days. - proBNP elevated, 15k.  Total bilirubin 2.3.  Improved to 1.9 this a.m.  Chest x-ray showed pulmonary vascular congestion/edema. - Currently on IV Lasix  40 mg twice daily.  Inadequate urine output in the last 24 hours.  1.1 L urine output in last 24 hours with net -0.3 L.  SOB mildly improved after L thoracentesis.  Telemetry reviewed, in A-fib, HR 90-100s.  Continues to be volume overloaded.  Increase IV Lasix  from 40 mg to 80 mg TID. - She never underwent LHC due to renal dysfunction.  RHC in October 2025 showed RA 27, PA 48/26, PCWP 22, PVR 2.2, TD CI 1.8, papi 0.81, likely end-stage physiology per advanced heart failure team.  Not a candidate for LVAD due to RV dysfunction.  Not a candidate for heart restoration due to advanced age, comorbidities. - Continue bisoprolol  5 mg once daily. - Continue spironolactone  25 mg once daily. - Due to end-stage heart failure physiology, heart failure team recommended palliative care consultation.  Patient wished to be full code and wished to live  with her daughter at home.  Atrial fibrillation with RVR - New onset this admission. - Telemetry reviewed, in A-fib with RVR, HR 90-100s. - Heart rates elevated likely from volume overload. - Continue bisoprolol  5 mg once daily. - Previously on Eliquis  for DVT however due to history of CVA and now atrial fibrillation, she will benefit from systemic [facility] however she had a history of GI bleed in November 2025 s/p 2U PRBC transfusions and family decided against any invasive gastric procedures at that time.  Risks of bleeding will outweigh the benefits if systemic AC is resumed (without GI workup).  I do not think she is a candidate for watchman implantation due to end-stage HF physiology, ?  Cognitive impairment.  L pleural effusion s/p thoracentesis  Liver cirrhosis - She has a history of liver cirrhosis.  Abnormal LFTs as noted in the HPI.  She has a mixed picture of ADHF and liver cirrhosis.  70-minute spent in reviewing prior medical records, reports, more than 3 labs, discussion and documentation.   For questions or updates, please contact [facility] Please consult [email or web address] for contact info under Cardiology/STEMI.   Signed, Diannah Arleta Maywood, MD 02/18/2024 11:58 AM      [1]  Allergies Allergen Reactions   Tetracyclines & Related Anaphylaxis, Dermatitis and Rash   Banana Hives and Nausea And Vomiting   Jardiance  [Empagliflozin ] Other (See Comments)    Yeast infections, fatigue   Penicillins Rash   "

## 2024-02-18 NOTE — Progress Notes (Signed)
 " PROGRESS NOTE   Stephanie [name]  FMW:[id number] DOB: 1950/08/05 DOA: 02/16/2024 PCP: [facility]   Chief Complaint  Patient presents with   Shortness of Breath   Level of care: Telemetry  Brief Admission History:  74 y.o. female with medical history significant for MGUS, chronic HFrEF, history of DVT (was taken off Eliquis  prior to admission due to history of GI bleed), CVA, CKD 3B, COPD/asthma, chronic hypoxia on 3-4 L [place] at baseline, fibromyalgia, GERD, type 2 diabetes, hypertension, hyperlipidemia, hypothyroidism, obesity, who presents to the ER with complaints of shortness of breath x 3 days and worsening.  Associated with generalized swelling.  Endorses compliance with home medications.  She does not weigh herself daily.  EMS was activated.  Upon EMS arrival, the patient was hypoxic with O2 saturation in the 66 to 70% on room air.  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  She was placed on 15 L nonrebreather and brought into the ER for further evaluation.   In the ER, pt was notably tachycardic and tachypneic.  Chest x-ray showed left lung base airspace opacity and moderate left pleural effusion.  Mild pulmonary edema.  Stable cardiomegaly.  Right upper lobe pulmonary nodule measuring 7 mm.  Lab studies notable for proBNP greater than 15,000.  Twelve-lead EKG, atrial fibrillation with a rate of 108, QTc 534.   The patient received IV Lasix  80 mg x 1.  TRH, hospitalist service, was asked to admit for further management of acute on chronic HFrEF.   ED Course: Temperature 97.6.  BP 151/97, pulse 103, respiratory rate 23.  O2 saturation 67% on room air.   Assessment and Plan:  Acute on chronic HFrEF Presented with cardiomegaly, pulmonary edema, left pleural effusion, severely marked volume overload. Last 2D echo done on 11/17/2023 revealed LVEF 35 to 40% IV furosemide  40 mg BID  Monitor strict I's and O's and daily weight Follow repeat transthoracic echocardiogram Cardiology consult   Filed  Weights   02/16/24 2133 02/17/24 0457 02/18/24 0519  Weight: 108.1 kg 108 kg 108.2 kg    Intake/Output Summary (Last 24 hours) at 02/18/2024 1145 Last data filed at 02/18/2024 0931 Gross per 24 hour  Intake 960 ml  Output 1100 ml  Net -140 ml   Moderate left pleural effusion US  thoracentesis requested for symptom relief with fluid studies ordered Pt had thora on 1/2 with 100 mL fluid removed   Hypothyroidism Follow TSH Resumed home levothyroxine    Type 2 diabetes with hyperglycemia Hemoglobin A1c 6.6 on 11/23/2023 Presented with serum glucose of 196 Heart healthy carb modified diet Supplemental sliding scale insulin  coverage. CBG (last 3)  Recent Labs    02/17/24 2021 02/18/24 0746 02/18/24 1120  GLUCAP 162* 125* 155*    CKD 3B Renal function appears to be at baseline 1.26 with GFR of 41. Monitor urine output Follow daily BMP    Advanced hepatic fibrosis Chronic hepatic fibrosis With chronic mildly abnormal LFTs, monitor GI recommended to continue lactulose , if patient refuses and recurrent AMS then start Xifaxan  550 mg BID  Elevated liver chemistries Alkaline phosphatase vitamin D , AST 48, T. bili 2.3 Avoid hepatotoxic agents Hepatic function panel stable today    Hypervolemic hyponatremia Serum sodium 127>>130 Continue diuresing Repeat BMP daily while diuresing   Non anion gap metabolic acidosis Serum bicarb 19, anion gap 15 Continue to monitor   GERD Resume home PPI   Hyperlipidemia Resume home Crestor    Obesity BMI 36 Recommend weight loss outpatient with regular physical activity and  healthy dieting   History of DVT in 2017 Was taken off Eliquis  prior to admission due to history of GI bleed Continue subcu insulin  for DVT prophylaxis.   QTc prolongation. QTc 534 on admission 12 EKG Avoid QTc prolonging agents Optimize magnesium  and potassium levels.   Monitor on telemetry.   MGUS Outpatient follow-up with medical oncology.   Right upper  lobe pulmonary nodule measuring 7 mm Outpatient follow-up.  DVT prophylaxis: enoxaparin   Code Status: Full  Family Communication:  Disposition: home    Consultants:  cardio Procedures:   Antimicrobials:    Subjective: Says she is starting to feel better, legs remain very swollen with fluid, she is agreeable to thoracentesis today  Objective: Vitals:   02/18/24 1020 02/18/24 1030 02/18/24 1040 02/18/24 1125  BP: (!) 147/88 (!) 131/93  (!) 142/86  Pulse: 98 94 86 93  Resp: (!) 23 (!) 22 15 19   Temp: 98 F (36.7 C)   97.7 F (36.5 C)  TempSrc: Oral   Oral  SpO2: 100% 100% 100% 100%  Weight:      Height:        Intake/Output Summary (Last 24 hours) at 02/18/2024 1145 Last data filed at 02/18/2024 0931 Gross per 24 hour  Intake 960 ml  Output 1100 ml  Net -140 ml   Filed Weights   02/16/24 2133 02/17/24 0457 02/18/24 0519  Weight: 108.1 kg 108 kg 108.2 kg   Examination:  General exam: Appears grossly volume overloaded;  calm and uncomfortable  Respiratory system: bibasilar crackles heard. Mild tachypnea.  Cardiovascular system: normal S1 & S2 heard. mild JVD, No rubs, gallops or clicks. 2+ pedal edema. Gastrointestinal system: Abdomen is mildly distended, soft and nontender. No organomegaly or masses felt. Normal bowel sounds heard. Central nervous system: Alert and oriented. No focal neurological deficits. Extremities: 2+ pitting edema BLEs Symmetric 5 x 5 power. Skin: No rashes, lesions or ulcers. Psychiatry: Judgement and insight appear normal. Mood & affect appropriate.   Data Reviewed: I have personally reviewed following labs and imaging studies  CBC: Recent Labs  Lab 02/16/24 1728 02/18/24 0516  WBC 9.2 8.7  NEUTROABS 7.2  --   HGB 12.4 10.6*  HCT 39.7 33.3*  MCV 80.0 80.2  PLT 317 219    Basic Metabolic Panel: Recent Labs  Lab 02/16/24 1728 02/18/24 0516  NA 127* 130*  K 4.3 4.1  CL 93* 97*  CO2 19* 28  GLUCOSE 196* 137*  BUN 31* 29*   CREATININE 1.36* 1.19*  CALCIUM  9.1 8.8*  MG  --  1.8    CBG: Recent Labs  Lab 02/17/24 1202 02/17/24 1637 02/17/24 2021 02/18/24 0746 02/18/24 1120  GLUCAP 168* 149* 162* 125* 155*    No results found for this or any previous visit (from the past 240 hours).   Radiology Studies: US  THORACENTESIS ASP PLEURAL SPACE W/IMG GUIDE Result Date: 02/18/2024 INDICATION: Patient with history of congestive heart failure, found to have new left pleural effusion. Request for diagnostic and therapeutic thoracentesis. EXAM: ULTRASOUND GUIDED LEFT THORACENTESIS MEDICATIONS: 8 mL 1% lidocaine  COMPLICATIONS: None immediate. PROCEDURE: An ultrasound guided thoracentesis was thoroughly discussed with the patient and questions answered. The benefits, risks, alternatives and complications were also discussed. The patient understands and wishes to proceed with the procedure. Written consent was obtained. Ultrasound was performed to localize and mark an adequate pocket of fluid in the left chest. The area was then prepped and draped in the normal sterile fashion. 1% Lidocaine  was used  for local anesthesia. Under ultrasound guidance a 6 Fr Safe-T-Centesis catheter was introduced. Thoracentesis was performed. The catheter was removed and a dressing applied. FINDINGS: A total of approximately 100 mL of fluid was removed. Samples were sent to the laboratory as requested by the clinical team. IMPRESSION: Successful ultrasound guided left thoracentesis yielding 100 mL of pleural fluid. Performed by Clotilda Hesselbach, PA-C Electronically Signed   By: Ester Sides M.D.   On: 02/18/2024 10:58   DG Chest Port 1 View Result Date: 02/16/2024 EXAM: 1 VIEW(S) XRAY OF THE CHEST 02/16/2024 06:03:00 PM COMPARISON: 12/21/2023 CLINICAL HISTORY: sob FINDINGS: LUNGS AND PLEURA: Left lung base airspace opacity. Moderate left pleural effusion. Mild pulmonary edema. There is a right upper lobe pulmonary nodule measuring 7 mm. No  pneumothorax. HEART AND MEDIASTINUM: Cardiomegaly, stable. BONES AND SOFT TISSUES: Right shoulder arthroplasty noted. IMPRESSION: 1. Left lung base airspace opacity and moderate left pleural effusion. 2. Mild pulmonary edema. 3. Stable cardiomegaly. 4. Right upper lobe pulmonary nodule measuring 7 mm. This can be further evaluated with non-emergent CT. Electronically signed by: Greig Pique MD 02/16/2024 07:17 PM EST RP Workstation: [id number]    Scheduled Meds:  bisoprolol   5 mg Oral Daily   cycloSPORINE   1 drop Both Eyes BID   DULoxetine   60 mg Oral BID   enoxaparin  (LOVENOX ) injection  40 mg Subcutaneous Q24H   furosemide   40 mg Intravenous Q12H   insulin  aspart  0-5 Units Subcutaneous QHS   insulin  aspart  0-9 Units Subcutaneous TID WC   insulin  glargine  10 Units Subcutaneous Daily   lactulose   20 g Oral BID   levothyroxine   88 mcg Oral QAC breakfast   pantoprazole   40 mg Oral BID   potassium chloride  SA  20 mEq Oral Daily   rosuvastatin   5 mg Oral Daily   spironolactone   25 mg Oral Daily   traZODone   150 mg Oral QPM   ursodiol   300 mg Oral BID   Continuous Infusions:   LOS: 2 days   Time spent: 55 mins  Lizabeth Fellner Vicci, MD How to contact the [facility] Attending or Consulting provider 7A - 7P or covering provider during after hours 7P -7A, for this patient?  Check the care team in [facility] and look for a) attending/consulting TRH provider listed and b) the TRH team listed Log into [email or web address] to find provider on call.  Locate the TRH provider you are looking for under [facility] and page to a number that you can be directly reached. If you still have difficulty reaching the provider, please page the [facility] (Director on Call) for the Hospitalists listed on amion for assistance.  02/18/2024, 11:45 AM    "

## 2024-02-18 NOTE — Plan of Care (Signed)

## 2024-02-19 DIAGNOSIS — I5023 Acute on chronic systolic (congestive) heart failure: Secondary | ICD-10-CM | POA: Diagnosis not present

## 2024-02-19 LAB — BASIC METABOLIC PANEL WITH GFR
Anion gap: 4 — ABNORMAL LOW (ref 5–15)
BUN: 27 mg/dL — ABNORMAL HIGH (ref 8–23)
CO2: 30 mmol/L (ref 22–32)
Calcium: 8.8 mg/dL — ABNORMAL LOW (ref 8.9–10.3)
Chloride: 97 mmol/L — ABNORMAL LOW (ref 98–111)
Creatinine, Ser: 1.17 mg/dL — ABNORMAL HIGH (ref 0.44–1.00)
GFR, Estimated: 49 mL/min — ABNORMAL LOW
Glucose, Bld: 153 mg/dL — ABNORMAL HIGH (ref 70–99)
Potassium: 4 mmol/L (ref 3.5–5.1)
Sodium: 131 mmol/L — ABNORMAL LOW (ref 135–145)

## 2024-02-19 LAB — GLUCOSE, CAPILLARY
Glucose-Capillary: 157 mg/dL — ABNORMAL HIGH (ref 70–99)
Glucose-Capillary: 196 mg/dL — ABNORMAL HIGH (ref 70–99)
Glucose-Capillary: 155 mg/dL — ABNORMAL HIGH (ref 70–99)
Glucose-Capillary: 156 mg/dL — ABNORMAL HIGH (ref 70–99)

## 2024-02-19 LAB — MAGNESIUM: Magnesium: 1.9 mg/dL (ref 1.7–2.4)

## 2024-02-19 LAB — PRO BRAIN NATRIURETIC PEPTIDE: Pro Brain Natriuretic Peptide: 12437 pg/mL — ABNORMAL HIGH

## 2024-02-19 NOTE — Progress Notes (Signed)
 Physical Therapy Treatment Patient Details Name: Stephanie [name] MRN: [id number] DOB: 09-27-1950 Today's Date: 02/19/2024   History of Present Illness Stephanie [name] is a 74 y.o. female with medical history significant for MGUS, chronic HFrEF, history of DVT (was taken off Eliquis  prior to admission due to history of GI bleed), CVA, CKD 3B, COPD/asthma, chronic hypoxia on 3-4 L [place] at baseline, fibromyalgia, GERD, type 2 diabetes, hypertension, hyperlipidemia, hypothyroidism, obesity, who presents to the ER with complaints of shortness of breath x 3 days and worsening.  Associated with generalized swelling.  Endorses compliance with home medications.  She does not weigh herself daily.  EMS was activated.  Upon EMS arrival, the patient was hypoxic with O2 saturation in the 66 to 70% on room air.  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  She was placed on 15 L nonrebreather and brought into the ER for further evaluation.    PT Comments  Patient presents seated in chair (assisted by nursing staff) and agreeable for therapy. Patient demonstrates fair/good return for completing BLE ROM/strengthening exercises with verbal cueing, limited to partially standing using RW due to weakness and c/o severe cramping pain LLE. Patient's BLE very tight and swollen and encouraged patient to stay up in chair as tolerated. Patient will benefit from continued skilled physical therapy in hospital and recommended venue below to increase strength, balance, endurance for safe ADLs and gait.      If plan is discharge home, recommend the following: A little help with walking and/or transfers;A little help with bathing/dressing/bathroom;Help with stairs or ramp for entrance;Assist for transportation;Assistance with cooking/housework   Can travel by private vehicle        Equipment Recommendations  None recommended by PT    Recommendations for Other Services       Precautions / Restrictions Precautions Precautions: Fall Recall of  Precautions/Restrictions: Intact Restrictions Weight Bearing Restrictions Per Provider Order: No     Mobility  Bed Mobility               General bed mobility comments: Patient presents in chair (assisted by nursing staff)    Transfers Overall transfer level: Needs assistance Equipment used: Rolling walker (2 wheels) Transfers: Sit to/from Stand Sit to Stand: Max assist           General transfer comment: Patient limited to partially standing using RW due to c/o severe cramping pain LLE    Ambulation/Gait                   Stairs             Wheelchair Mobility     Tilt Bed    Modified Rankin (Stroke Patients Only)       Balance Overall balance assessment: Needs assistance Sitting-balance support: Feet supported, No upper extremity supported Sitting balance-Leahy Scale: Good Sitting balance - Comments: seated in chair   Standing balance support: Reliant on assistive device for balance, During functional activity, Bilateral upper extremity supported Standing balance-Leahy Scale: Poor Standing balance comment: using RW                            Communication Communication Communication: No apparent difficulties  Cognition Arousal: Alert Behavior During Therapy: WFL for tasks assessed/performed   PT - Cognitive impairments: No apparent impairments                         Following commands: Intact  Cueing Cueing Techniques: Verbal cues, Tactile cues  Exercises General Exercises - Lower Extremity Long Arc Quad: Seated, AROM, Strengthening, Both, 10 reps Hip Flexion/Marching: Seated, AROM, Strengthening, Both, 10 reps Toe Raises: Seated, AROM, Strengthening, Both, 10 reps Heel Raises: Seated, AROM, Strengthening, Both, 10 reps    General Comments        Pertinent Vitals/Pain Pain Assessment Pain Assessment: Faces Faces Pain Scale: Hurts even more Pain Location: LLE cramping Pain Descriptors /  Indicators: Cramping Pain Intervention(s): Limited activity within patient's tolerance, Monitored during session, Repositioned    Home Living                          Prior Function            PT Goals (current goals can now be found in the care plan section) Acute Rehab PT Goals Patient Stated Goal: return home with family to assist PT Goal Formulation: With patient Time For Goal Achievement: 02/23/24 Potential to Achieve Goals: Good Progress towards PT goals: Progressing toward goals    Frequency    Min 3X/week      PT Plan      Co-evaluation              AM-PAC PT 6 Clicks Mobility   Outcome Measure  Help needed turning from your back to your side while in a flat bed without using bedrails?: A Little Help needed moving from lying on your back to sitting on the side of a flat bed without using bedrails?: A Little Help needed moving to and from a bed to a chair (including a wheelchair)?: A Lot Help needed standing up from a chair using your arms (e.g., wheelchair or bedside chair)?: A Lot Help needed to walk in hospital room?: A Lot   6 Click Score: 12    End of Session Equipment Utilized During Treatment: Oxygen  Activity Tolerance: Patient tolerated treatment well;Patient limited by fatigue;Patient limited by pain Patient left: in chair;with call bell/phone within reach Nurse Communication: Mobility status PT Visit Diagnosis: Unsteadiness on feet (R26.81);Other abnormalities of gait and mobility (R26.89);Muscle weakness (generalized) (M62.81)     Time: 8896-8876 PT Time Calculation (min) (ACUTE ONLY): 20 min  Charges:    $Therapeutic Exercise: 8-22 mins $Therapeutic Activity: 8-22 mins PT General Charges $$ ACUTE PT VISIT: 1 Visit                     1:27 PM, 02/19/2024 Lynwood Music, MPT Physical Therapist with [facility] [phone] office [phone] mobile phone

## 2024-02-19 NOTE — Plan of Care (Signed)

## 2024-02-19 NOTE — Progress Notes (Signed)
 " PROGRESS NOTE   Stephanie [name]  FMW:[id number] DOB: May 08, 1950 DOA: 02/16/2024 PCP: [facility]   Chief Complaint  Patient presents with   Shortness of Breath   Level of care: Telemetry  Brief Admission History:  74 y.o. female with medical history significant for MGUS, chronic HFrEF, history of DVT (was taken off Eliquis  prior to admission due to history of GI bleed), CVA, CKD 3B, COPD/asthma, chronic hypoxia on 3-4 L [place] at baseline, fibromyalgia, GERD, type 2 diabetes, hypertension, hyperlipidemia, hypothyroidism, obesity, who presents to the ER with complaints of shortness of breath x 3 days and worsening.  Associated with generalized swelling.  Endorses compliance with home medications.  She does not weigh herself daily.  EMS was activated.  Upon EMS arrival, the patient was hypoxic with O2 saturation in the 66 to 70% on room air.  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  She was placed on 15 L nonrebreather and brought into the ER for further evaluation.   In the ER, pt was notably tachycardic and tachypneic.  Chest x-ray showed left lung base airspace opacity and moderate left pleural effusion.  Mild pulmonary edema.  Stable cardiomegaly.  Right upper lobe pulmonary nodule measuring 7 mm.  Lab studies notable for proBNP greater than 15,000.  Twelve-lead EKG, atrial fibrillation with a rate of 108, QTc 534.   The patient received IV Lasix  80 mg x 1.  TRH, hospitalist service, was asked to admit for further management of acute on chronic HFrEF.   ED Course: Temperature 97.6.  BP 151/97, pulse 103, respiratory rate 23.  O2 saturation 67% on room air.   Assessment and Plan:  Acute on chronic HFrEF Presented with cardiomegaly, pulmonary edema, left pleural effusion, severely marked volume overload. Last 2D echo done on 11/17/2023 revealed LVEF 35 to 40% IV furosemide  40 mg BID, increased by cardiology to 80 mg IV TID on 02/18/24  Monitor strict I's and O's and daily weight Follow repeat transthoracic  echocardiogram Cardiology consult recommendations as ordered    [facility] Weights   02/17/24 0457 02/18/24 0519 02/19/24 0515  Weight: 108 kg 108.2 kg 106.7 kg    Intake/Output Summary (Last 24 hours) at 02/19/2024 0901 Last data filed at 02/19/2024 0556 Gross per 24 hour  Intake 720 ml  Output 1700 ml  Net -980 ml   Moderate left pleural effusion US  thoracentesis requested for symptom relief with fluid studies ordered Pt had thoracentesis on 1/2 with 100 mL fluid removed   Hypothyroidism Follow TSH Resumed home levothyroxine    Type 2 diabetes with hyperglycemia Hemoglobin A1c 6.6 on 11/23/2023 Presented with serum glucose of 196 Heart healthy carb modified diet Supplemental sliding scale insulin  coverage. CBG (last 3)  Recent Labs    02/18/24 1628 02/18/24 1939 02/19/24 0751  GLUCAP 176* 164* 157*    CKD 3B Renal function appears to be at baseline 1.26 with GFR of 41. Monitor urine output Follow daily BMP    Advanced hepatic fibrosis Chronic hepatic fibrosis With chronic mildly abnormal LFTs, monitor GI recommended to continue lactulose , if patient refuses and recurrent AMS then start Xifaxan  550 mg BID  Elevated liver chemistries Alkaline phosphatase vitamin D , AST 48, T. bili 2.3 Avoid hepatotoxic agents Hepatic function panel stable    Hypervolemic hyponatremia Serum sodium 127>>131 Continue diuresing Repeat BMP daily while diuresing   Non anion gap metabolic acidosis Serum bicarb 19, anion gap 15 Continue to monitor   GERD Resume home PPI   Hyperlipidemia Resume home Crestor   Obesity BMI 36 Recommend weight loss outpatient with regular physical activity and healthy dieting   History of DVT in 2017 Was taken off Eliquis  prior to admission due to history of GI bleed Continue subcu insulin  for DVT prophylaxis.   QTc prolongation. QTc 534 on admission 12 EKG Avoid QTc prolonging agents Optimize magnesium  and potassium levels.   Monitor on  telemetry.   MGUS Outpatient follow-up with medical oncology.   Right upper lobe pulmonary nodule measuring 7 mm Outpatient follow-up.  DVT prophylaxis: enoxaparin   Code Status: Full  Family Communication:  Disposition: home    Consultants:  cardio Procedures:   Antimicrobials:    Subjective: Pt say she is feeling better and tolerated thoracentesis well.    Objective: Vitals:   02/18/24 1649 02/18/24 1939 02/19/24 0515 02/19/24 0817  BP: 139/87 (!) 132/92 (!) 131/90 (!) 140/89  Pulse: 91 90 93 87  Resp: 20 19 19 20   Temp: 97.7 F (36.5 C) 97.8 F (36.6 C) 97.9 F (36.6 C) (!) 97.4 F (36.3 C)  TempSrc: Oral   Oral  SpO2: 100% 100% 100% 95%  Weight:   106.7 kg   Height:        Intake/Output Summary (Last 24 hours) at 02/19/2024 0901 Last data filed at 02/19/2024 0556 Gross per 24 hour  Intake 720 ml  Output 1700 ml  Net -980 ml   Filed Weights   02/17/24 0457 02/18/24 0519 02/19/24 0515  Weight: 108 kg 108.2 kg 106.7 kg   Examination:  General exam: Appears grossly volume overloaded;  calm and uncomfortable  Respiratory system: no increased work of breathing.   Cardiovascular system: normal S1 & S2 heard. mild JVD, No rubs, gallops or clicks. 2+ pedal edema. Gastrointestinal system: Abdomen is mildly distended, soft and nontender. No organomegaly or masses felt. Normal bowel sounds heard. Central nervous system: Alert and oriented. No focal neurological deficits. Extremities: 1+ pitting edema BLEs Symmetric 5 x 5 power. Skin: No rashes, lesions or ulcers. Psychiatry: Judgement and insight appear normal. Mood & affect appropriate.   Data Reviewed: I have personally reviewed following labs and imaging studies  CBC: Recent Labs  Lab 02/16/24 1728 02/18/24 0516  WBC 9.2 8.7  NEUTROABS 7.2  --   HGB 12.4 10.6*  HCT 39.7 33.3*  MCV 80.0 80.2  PLT 317 219    Basic Metabolic Panel: Recent Labs  Lab 02/16/24 1728 02/18/24 0516 02/19/24 0458  NA 127*  130* 131*  K 4.3 4.1 4.0  CL 93* 97* 97*  CO2 19* 28 30  GLUCOSE 196* 137* 153*  BUN 31* 29* 27*  CREATININE 1.36* 1.19* 1.17*  CALCIUM  9.1 8.8* 8.8*  MG  --  1.8 1.9    CBG: Recent Labs  Lab 02/18/24 0746 02/18/24 1120 02/18/24 1628 02/18/24 1939 02/19/24 0751  GLUCAP 125* 155* 176* 164* 157*    No results found for this or any previous visit (from the past 240 hours).   Radiology Studies: ECHOCARDIOGRAM COMPLETE Result Date: 02/18/2024    ECHOCARDIOGRAM REPORT   Patient Name:   Stephanie [name] Sales Date of Exam: 02/17/2024 Medical Rec #:  [id number]   Height:       68.0 in Accession #:    [id number]  Weight:       238.1 lb Date of Birth:  Nov 09, 1950   BSA:          2.201 m Patient Age:    73 years    BP:  160/93 mmHg Patient Gender: F           HR:           100 bpm. Exam Location:  [facility] Procedure: 2D Echo, Cardiac Doppler, Color Doppler and Intracardiac            Opacification Agent (Both Spectral and Color Flow Doppler were            utilized during procedure). Indications:    CHF- Acute Systolic l50.21  History:        Patient has prior history of Echocardiogram examinations, most                 recent 11/17/2023. CHF, TIA and Stroke, Arrythmias:Atrial                 Fibrillation; Risk Factors:Hypertension, Diabetes, Dyslipidemia                 and Former Smoker.  Sonographer:    Aida Pizza RCS Referring Phys: [id number] CAROLE N HALL IMPRESSIONS  1. No LV thrombus by Definity . Left ventricular ejection fraction, by estimation, is 25 to 30%. The left ventricle has severely decreased function. The left ventricle demonstrates regional wall motion abnormalities (see scoring diagram/findings for description). Left ventricular diastolic function could not be evaluated. There is the interventricular septum is flattened in systole and diastole, consistent with right ventricular pressure and volume overload.  2. Right ventricular systolic function is severely reduced. The right  ventricular size is moderately enlarged. There is moderately elevated pulmonary artery systolic pressure. The estimated right ventricular systolic pressure is 46.4 mmHg.  3. Left atrial size was mildly dilated.  4. Right atrial size was mildly dilated.  5. A small pericardial effusion is present. The pericardial effusion is circumferential. Large pleural effusion in the left lateral region.  6. The mitral valve is normal in structure. Mild to moderate mitral valve regurgitation. No evidence of mitral stenosis.  7. The tricuspid valve is abnormal. Tricuspid valve regurgitation is moderate to severe.  8. The aortic valve has an indeterminant number of cusps. Aortic valve regurgitation is not visualized. No aortic stenosis is present. Aortic valve mean gradient measures 3.0 mmHg.  9. The inferior vena cava is dilated in size with <50% respiratory variability, suggesting right atrial pressure of 15 mmHg. FINDINGS  Left Ventricle: No LV thrombus by Definity . Left ventricular ejection fraction, by estimation, is 25 to 30%. The left ventricle has severely decreased function. The left ventricle demonstrates regional wall motion abnormalities. Definity  contrast agent was given IV to delineate the left ventricular endocardial borders. Strain was performed and the global longitudinal strain is indeterminate. The left ventricular internal cavity size was normal in size. There is no left ventricular hypertrophy. The interventricular septum is flattened in systole and diastole, consistent with right ventricular pressure and volume overload. Left ventricular diastolic function could not be evaluated due to atrial fibrillation. Left ventricular diastolic function could  not be evaluated.  LV Wall Scoring: The entire lateral wall and anterior septum are akinetic. The inferior septum and apex are hypokinetic. Right Ventricle: The right ventricular size is moderately enlarged. No increase in right ventricular wall thickness. Right  ventricular systolic function is severely reduced. There is moderately elevated pulmonary artery systolic pressure. The tricuspid regurgitant velocity is 2.80 m/s, and with an assumed right atrial pressure of 15 mmHg, the estimated right ventricular systolic pressure is 46.4 mmHg. Left Atrium: Left atrial size was mildly dilated. Right Atrium: Right atrial size  was mildly dilated. Pericardium: A small pericardial effusion is present. The pericardial effusion is circumferential. Mitral Valve: The mitral valve is normal in structure. Mild to moderate mitral valve regurgitation. No evidence of mitral valve stenosis. Tricuspid Valve: The tricuspid valve is abnormal. Tricuspid valve regurgitation is moderate to severe. No evidence of tricuspid stenosis. Aortic Valve: The aortic valve has an indeterminant number of cusps. Aortic valve regurgitation is not visualized. No aortic stenosis is present. Aortic valve mean gradient measures 3.0 mmHg. Aortic valve peak gradient measures 4.8 mmHg. Aortic valve area, by VTI measures 2.45 cm. Pulmonic Valve: The pulmonic valve was not well visualized. Pulmonic valve regurgitation is trivial. No evidence of pulmonic stenosis. Aorta: The aortic root is normal in size and structure. Venous: The inferior vena cava is dilated in size with less than 50% respiratory variability, suggesting right atrial pressure of 15 mmHg. IAS/Shunts: No atrial level shunt detected by color flow Doppler. Additional Comments: 3D was performed not requiring image post processing on an independent workstation and was indeterminate. There is a large pleural effusion in the left lateral region.  LEFT VENTRICLE PLAX 2D LVIDd:         4.70 cm      Diastology LVIDs:         3.90 cm      LV e' medial:    5.22 cm/s LV PW:         1.00 cm      LV E/e' medial:  23.3 LV IVS:        1.00 cm      LV e' lateral:   8.95 cm/s LVOT diam:     1.90 cm      LV E/e' lateral: 13.6 LV SV:         50 LV SV Index:   23 LVOT Area:      2.84 cm  LV Volumes (MOD) LV vol d, MOD A2C: 113.0 ml LV vol d, MOD A4C: 110.0 ml LV vol s, MOD A2C: 69.0 ml LV vol s, MOD A4C: 77.3 ml LV SV MOD A2C:     44.0 ml LV SV MOD A4C:     110.0 ml LV SV MOD BP:      36.9 ml RIGHT VENTRICLE RV S prime:     9.70 cm/s TAPSE (M-mode): 2.0 cm LEFT ATRIUM             Index        RIGHT ATRIUM           Index LA diam:        4.10 cm 1.86 cm/m   RA Area:     22.40 cm LA Vol (A2C):   62.4 ml 28.35 ml/m  RA Volume:   69.80 ml  31.71 ml/m LA Vol (A4C):   75.2 ml 34.17 ml/m LA Biplane Vol: 74.5 ml 33.85 ml/m  AORTIC VALVE AV Area (Vmax):    2.40 cm AV Area (Vmean):   2.19 cm AV Area (VTI):     2.45 cm AV Vmax:           109.00 cm/s AV Vmean:          77.100 cm/s AV VTI:            0.206 m AV Peak Grad:      4.8 mmHg AV Mean Grad:      3.0 mmHg LVOT Vmax:         92.33 cm/s LVOT Vmean:  59.533 cm/s LVOT VTI:          0.178 m LVOT/AV VTI ratio: 0.86  AORTA Ao Root diam: 3.20 cm MITRAL VALVE                TRICUSPID VALVE MV Area (PHT): 5.43 cm     TR Peak grad:   31.4 mmHg MV Decel Time: 140 msec     TR Vmax:        280.00 cm/s MR Peak grad: 64.6 mmHg MR Mean grad: 45.0 mmHg     SHUNTS MR Vmax:      402.00 cm/s   Systemic VTI:  0.18 m MR Vmean:     320.0 cm/s    Systemic Diam: 1.90 cm MV E velocity: 121.67 cm/s MV A velocity: 98.00 cm/s MV E/A ratio:  1.24 Vishnu Priya Mallipeddi Electronically signed by Diannah Late Mallipeddi Signature Date/Time: 02/18/2024/3:47:41 PM    Final    DG Chest 1 View Result Date: 02/18/2024 CLINICAL DATA:  Post thoracentesis. EXAM: CHEST  1 VIEW COMPARISON:  02/16/2024 FINDINGS: Diminished left pleural effusion post thoracentesis. No visible pneumothorax. Persistent opacity at the left lung base. Stable cardiomegaly. Generalized increase in bilateral perihilar opacities which may represent edema. IMPRESSION: 1. Diminished left pleural effusion post thoracentesis. No visible pneumothorax. 2. Persistent opacity at the left lung base. 3.  Generalized increase in bilateral perihilar opacities which may represent edema. Electronically Signed   By: Andrea Gasman M.D.   On: 02/18/2024 11:52   US  THORACENTESIS ASP PLEURAL SPACE W/IMG GUIDE Result Date: 02/18/2024 INDICATION: Patient with history of congestive heart failure, found to have new left pleural effusion. Request for diagnostic and therapeutic thoracentesis. EXAM: ULTRASOUND GUIDED LEFT THORACENTESIS MEDICATIONS: 8 mL 1% lidocaine  COMPLICATIONS: None immediate. PROCEDURE: An ultrasound guided thoracentesis was thoroughly discussed with the patient and questions answered. The benefits, risks, alternatives and complications were also discussed. The patient understands and wishes to proceed with the procedure. Written consent was obtained. Ultrasound was performed to localize and mark an adequate pocket of fluid in the left chest. The area was then prepped and draped in the normal sterile fashion. 1% Lidocaine  was used for local anesthesia. Under ultrasound guidance a 6 Fr Safe-T-Centesis catheter was introduced. Thoracentesis was performed. The catheter was removed and a dressing applied. FINDINGS: A total of approximately 100 mL of fluid was removed. Samples were sent to the laboratory as requested by the clinical team. IMPRESSION: Successful ultrasound guided left thoracentesis yielding 100 mL of pleural fluid. Performed by Clotilda Hesselbach, PA-C Electronically Signed   By: Ester Sides M.D.   On: 02/18/2024 10:58   Scheduled Meds:  bisoprolol   5 mg Oral Daily   cycloSPORINE   1 drop Both Eyes BID   DULoxetine   60 mg Oral BID   enoxaparin  (LOVENOX ) injection  40 mg Subcutaneous Q24H   furosemide   80 mg Intravenous TID   insulin  aspart  0-5 Units Subcutaneous QHS   insulin  aspart  0-9 Units Subcutaneous TID WC   insulin  glargine  10 Units Subcutaneous Daily   lactulose   20 g Oral BID   levothyroxine   88 mcg Oral QAC breakfast   pantoprazole   40 mg Oral BID   potassium chloride   SA  20 mEq Oral Daily   rosuvastatin   5 mg Oral Daily   spironolactone   25 mg Oral Daily   traZODone   150 mg Oral QPM   ursodiol   300 mg Oral BID   Continuous Infusions:   LOS: 3 days  Time spent: 55 mins  Juris Gosnell Vicci, MD How to contact the [facility] Attending or Consulting provider 7A - 7P or covering provider during after hours 7P -7A, for this patient?  Check the care team in [facility] and look for a) attending/consulting [facility] provider listed and b) the [facility] team listed Log into [email or web address] to find provider on call.  Locate the [facility] provider you are looking for under [facility] and page to a number that you can be directly reached. If you still have difficulty reaching the provider, please page the [facility] (Director on Call) for the Hospitalists listed on amion for assistance.  02/19/2024, 9:01 AM    "

## 2024-02-19 NOTE — Plan of Care (Signed)
" °  Problem: Education: Goal: Knowledge of General Education information will improve Description: Including pain rating scale, medication(s)/side effects and non-pharmacologic comfort measures 02/19/2024 0450 by Halbert Eleanor HERO, RN Outcome: Progressing 02/19/2024 0450 by Halbert Eleanor HERO, RN Outcome: Progressing   Problem: Health Behavior/Discharge Planning: Goal: Ability to manage health-related needs will improve 02/19/2024 0450 by Halbert Eleanor HERO, RN Outcome: Progressing 02/19/2024 0450 by Halbert Eleanor HERO, RN Outcome: Progressing   Problem: Clinical Measurements: Goal: Ability to maintain clinical measurements within normal limits will improve 02/19/2024 0450 by Halbert Eleanor HERO, RN Outcome: Progressing 02/19/2024 0450 by Halbert Eleanor HERO, RN Outcome: Progressing Goal: Will remain free from infection 02/19/2024 0450 by Halbert Eleanor HERO, RN Outcome: Progressing 02/19/2024 0450 by Halbert Eleanor HERO, RN Outcome: Progressing Goal: Diagnostic test results will improve 02/19/2024 0450 by Halbert Eleanor HERO, RN Outcome: Progressing 02/19/2024 0450 by Halbert Eleanor HERO, RN Outcome: Progressing Goal: Respiratory complications will improve Outcome: Progressing Goal: Cardiovascular complication will be avoided Outcome: Progressing   Problem: Activity: Goal: Risk for activity intolerance will decrease Outcome: Progressing   Problem: Nutrition: Goal: Adequate nutrition will be maintained Outcome: Progressing   Problem: Coping: Goal: Level of anxiety will decrease Outcome: Progressing   Problem: Elimination: Goal: Will not experience complications related to bowel motility Outcome: Progressing Goal: Will not experience complications related to urinary retention Outcome: Progressing   Problem: Pain Managment: Goal: General experience of comfort will improve and/or be controlled Outcome: Progressing   Problem: Safety: Goal: Ability to remain free from injury will  improve Outcome: Progressing   Problem: Skin Integrity: Goal: Risk for impaired skin integrity will decrease Outcome: Progressing   Problem: Education: Goal: Ability to describe self-care measures that may prevent or decrease complications (Diabetes Survival Skills Education) will improve Outcome: Progressing Goal: Individualized Educational Video(s) Outcome: Progressing   Problem: Coping: Goal: Ability to adjust to condition or change in health will improve Outcome: Progressing   Problem: Fluid Volume: Goal: Ability to maintain a balanced intake and output will improve Outcome: Progressing   Problem: Health Behavior/Discharge Planning: Goal: Ability to identify and utilize available resources and services will improve Outcome: Progressing Goal: Ability to manage health-related needs will improve Outcome: Progressing   Problem: Metabolic: Goal: Ability to maintain appropriate glucose levels will improve Outcome: Progressing   Problem: Nutritional: Goal: Maintenance of adequate nutrition will improve Outcome: Progressing Goal: Progress toward achieving an optimal weight will improve Outcome: Progressing   Problem: Skin Integrity: Goal: Risk for impaired skin integrity will decrease Outcome: Progressing   Problem: Tissue Perfusion: Goal: Adequacy of tissue perfusion will improve Outcome: Progressing   "

## 2024-02-20 ENCOUNTER — Inpatient Hospital Stay (HOSPITAL_COMMUNITY)

## 2024-02-20 DIAGNOSIS — I5023 Acute on chronic systolic (congestive) heart failure: Secondary | ICD-10-CM | POA: Diagnosis not present

## 2024-02-20 LAB — BASIC METABOLIC PANEL WITH GFR
Anion gap: 7 (ref 5–15)
BUN: 27 mg/dL — ABNORMAL HIGH (ref 8–23)
CO2: 28 mmol/L (ref 22–32)
Calcium: 8.9 mg/dL (ref 8.9–10.3)
Chloride: 98 mmol/L (ref 98–111)
Creatinine, Ser: 1.23 mg/dL — ABNORMAL HIGH (ref 0.44–1.00)
GFR, Estimated: 46 mL/min — ABNORMAL LOW
Glucose, Bld: 120 mg/dL — ABNORMAL HIGH (ref 70–99)
Potassium: 4.1 mmol/L (ref 3.5–5.1)
Sodium: 133 mmol/L — ABNORMAL LOW (ref 135–145)

## 2024-02-20 LAB — PRO BRAIN NATRIURETIC PEPTIDE: Pro Brain Natriuretic Peptide: 12077 pg/mL — ABNORMAL HIGH

## 2024-02-20 LAB — GLUCOSE, CAPILLARY
Glucose-Capillary: 121 mg/dL — ABNORMAL HIGH (ref 70–99)
Glucose-Capillary: 188 mg/dL — ABNORMAL HIGH (ref 70–99)
Glucose-Capillary: 151 mg/dL — ABNORMAL HIGH (ref 70–99)
Glucose-Capillary: 174 mg/dL — ABNORMAL HIGH (ref 70–99)

## 2024-02-20 MED ORDER — ORAL CARE MOUTH RINSE: 15.0000 mL | OROMUCOSAL | Status: DC | PRN

## 2024-02-20 NOTE — Progress Notes (Signed)
 Physical Therapy Treatment Patient Details Name: Stephanie [name] MRN: [id number] DOB: Mar 07, 1950 Today's Date: 02/20/2024   History of Present Illness Stephanie [name] is a 74 y.o. female with medical history significant for MGUS, chronic HFrEF, history of DVT (was taken off Eliquis  prior to admission due to history of GI bleed), CVA, CKD 3B, COPD/asthma, chronic hypoxia on 3-4 L [place] at baseline, fibromyalgia, GERD, type 2 diabetes, hypertension, hyperlipidemia, hypothyroidism, obesity, who presents to the ER with complaints of shortness of breath x 3 days and worsening.  Associated with generalized swelling.  Endorses compliance with home medications.  She does not weigh herself daily.  EMS was activated.  Upon EMS arrival, the patient was hypoxic with O2 saturation in the 66 to 70% on room air.  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  She was placed on 15 L nonrebreather and brought into the ER for further evaluation.    PT Comments  Patient presents in chair (assisted by nursing staff) and agreeable for therapy. Patient has most difficulty completing sit to stands due to BLE weakness and c/o increasing pain in feet and legs when weightbearing and limited to a few side steps before having to sit. Patient unable to lift legs onto bed due weakness/pain and required Min/mod assist for repositioning when put back to bed. Patient will benefit from continued skilled physical therapy in hospital and recommended venue below to increase strength, balance, endurance for safe ADLs and gait.     If plan is discharge home, recommend the following: A little help with walking and/or transfers;A little help with bathing/dressing/bathroom;Help with stairs or ramp for entrance;Assist for transportation;Assistance with cooking/housework   Can travel by private vehicle        Equipment Recommendations  None recommended by PT    Recommendations for Other Services       Precautions / Restrictions Precautions Precautions: Fall Recall of  Precautions/Restrictions: Intact Restrictions Weight Bearing Restrictions Per Provider Order: No     Mobility  Bed Mobility Overal bed mobility: Needs Assistance Bed Mobility: Sit to Supine       Sit to supine: Min assist, Mod assist   General bed mobility comments: required assistance for moving legs onto bed    Transfers Overall transfer level: Needs assistance Equipment used: Rolling walker (2 wheels) Transfers: Sit to/from Stand, Bed to chair/wheelchair/BSC Sit to Stand: Mod assist   Step pivot transfers: Mod assist       General transfer comment: unsteady labored movement with c/o increasing pain in feet    Ambulation/Gait Ambulation/Gait assistance: Mod assist Gait Distance (Feet): 3 Feet Assistive device: Rolling walker (2 wheels) Gait Pattern/deviations: Decreased step length - right, Decreased step length - left, Decreased stride length, Trunk flexed Gait velocity: slow     General Gait Details: limited to a few steps at bedside before having to sit due to c/o increasing pain in feet, legs   Stairs             Wheelchair Mobility     Tilt Bed    Modified Rankin (Stroke Patients Only)       Balance Overall balance assessment: Needs assistance Sitting-balance support: Feet supported, No upper extremity supported Sitting balance-Leahy Scale: Good Sitting balance - Comments: seated at EOB   Standing balance support: Reliant on assistive device for balance, During functional activity, Bilateral upper extremity supported Standing balance-Leahy Scale: Poor Standing balance comment: fair/poor using RW  Communication Communication Communication: No apparent difficulties  Cognition Arousal: Alert Behavior During Therapy: WFL for tasks assessed/performed   PT - Cognitive impairments: No apparent impairments                         Following commands: Intact      Cueing Cueing Techniques:  Verbal cues, Tactile cues  Exercises      General Comments        Pertinent Vitals/Pain Pain Assessment Pain Assessment: Faces Faces Pain Scale: Hurts little more Pain Location: heels of feet Pain Descriptors / Indicators: Discomfort, Grimacing Pain Intervention(s): Limited activity within patient's tolerance, Monitored during session, Repositioned    Home Living                          Prior Function            PT Goals (current goals can now be found in the care plan section) Acute Rehab PT Goals Patient Stated Goal: return home with family to assist PT Goal Formulation: With patient Time For Goal Achievement: 02/23/24 Potential to Achieve Goals: Good Progress towards PT goals: Progressing toward goals    Frequency    Min 3X/week      PT Plan      Co-evaluation              AM-PAC PT 6 Clicks Mobility   Outcome Measure  Help needed turning from your back to your side while in a flat bed without using bedrails?: A Little Help needed moving from lying on your back to sitting on the side of a flat bed without using bedrails?: A Lot Help needed moving to and from a bed to a chair (including a wheelchair)?: A Lot Help needed standing up from a chair using your arms (e.g., wheelchair or bedside chair)?: A Lot Help needed to walk in hospital room?: A Lot Help needed climbing 3-5 steps with a railing? : Total 6 Click Score: 12    End of Session Equipment Utilized During Treatment: Oxygen  Activity Tolerance: Patient tolerated treatment well;Patient limited by fatigue Patient left: in bed;with call bell/phone within reach Nurse Communication: Mobility status PT Visit Diagnosis: Unsteadiness on feet (R26.81);Other abnormalities of gait and mobility (R26.89);Muscle weakness (generalized) (M62.81)     Time: 8753-8693 PT Time Calculation (min) (ACUTE ONLY): 20 min  Charges:    $Therapeutic Activity: 8-22 mins PT General Charges $$ ACUTE PT  VISIT: 1 Visit                     3:01 PM, 02/20/2024 Lynwood Music, MPT Physical Therapist with [facility] [phone] office [phone] mobile phone

## 2024-02-20 NOTE — Progress Notes (Signed)
 " PROGRESS NOTE   Stephanie [name]  FMW:[id number] DOB: [birth year] DOA: 02/16/2024 PCP: [facility]   Chief Complaint  Patient presents with   Shortness of Breath   Level of care: Telemetry  Brief Admission History:  74 y.o. female with medical history significant for MGUS, chronic HFrEF, history of DVT (was taken off Eliquis  prior to admission due to history of GI bleed), CVA, CKD 3B, COPD/asthma, chronic hypoxia on 3-4 L [place] at baseline, fibromyalgia, GERD, type 2 diabetes, hypertension, hyperlipidemia, hypothyroidism, obesity, who presents to the ER with complaints of shortness of breath x 3 days and worsening.  Associated with generalized swelling.  Endorses compliance with home medications.  She does not weigh herself daily.  EMS was activated.  Upon EMS arrival, the patient was hypoxic with O2 saturation in the 66 to 70% on room air.  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  She was placed on 15 L nonrebreather and brought into the ER for further evaluation.   In the ER, pt was notably tachycardic and tachypneic.  Chest x-ray showed left lung base airspace opacity and moderate left pleural effusion.  Mild pulmonary edema.  Stable cardiomegaly.  Right upper lobe pulmonary nodule measuring 7 mm.  Lab studies notable for proBNP greater than 15,000.  Twelve-lead EKG, atrial fibrillation with a rate of 108, QTc 534.   The patient received IV Lasix  80 mg x 1.  TRH, hospitalist service, was asked to admit for further management of acute on chronic HFrEF.   ED Course: Temperature 97.6.  BP 151/97, pulse 103, respiratory rate 23.  O2 saturation 67% on room air.   Assessment and Plan:  Acute on chronic HFrEF Presented with cardiomegaly, pulmonary edema, left pleural effusion, severely marked volume overload. Last 2D echo done on 11/17/2023 revealed LVEF 35 to 40% IV furosemide  40 mg BID, increased by cardiology to 80 mg IV TID on 02/18/24  Monitor strict I's and O's and daily weight Follow repeat transthoracic  echocardiogram Cardiology consult recommendations as ordered    [facility] Weights   02/18/24 0519 02/19/24 0515 02/20/24 0511  Weight: 108.2 kg 106.7 kg 106.1 kg    Intake/Output Summary (Last 24 hours) at 02/20/2024 1421 Last data filed at 02/20/2024 1324 Gross per 24 hour  Intake 840 ml  Output 400 ml  Net 440 ml   Moderate left pleural effusion US  thoracentesis requested for symptom relief with fluid studies ordered Pt had thoracentesis on 02/18/24 with 100 mL fluid removed   Hypothyroidism Follow TSH Resumed home levothyroxine    Type 2 diabetes with hyperglycemia Hemoglobin A1c 6.6 on 11/23/2023 Presented with serum glucose of 196 Heart healthy carb modified diet Supplemental sliding scale insulin  coverage. CBG (last 3)  Recent Labs    02/19/24 2057 02/20/24 0710 02/20/24 1145  GLUCAP 156* 121* 188*    CKD 3B Renal function appears to be at baseline 1.26 with GFR of 41. Monitor urine output Follow daily BMP    Advanced hepatic fibrosis Chronic hepatic fibrosis With chronic mildly abnormal LFTs, monitor GI recommended to continue lactulose , if patient refuses and recurrent AMS then start Xifaxan  550 mg BID So far, pt has been taking the lactulose    Elevated liver chemistries Alkaline phosphatase vitamin D , AST 48, T. bili 2.3 Avoid hepatotoxic agents Hepatic function panel stable    Hypervolemic hyponatremia Serum sodium 127>>131 Continue diuresing Repeat BMP daily while diuresing   Non anion gap metabolic acidosis Serum bicarb 19, anion gap 15 Continue to monitor   GERD Resume  home PPI   Hyperlipidemia Resume home Crestor    Obesity BMI 36 Recommend weight loss outpatient with regular physical activity and healthy dieting   History of DVT in 2017 Was taken off Eliquis  prior to admission due to history of GI bleed Continue subcu insulin  for DVT prophylaxis.   QTc prolongation. QTc 534 on admission 12 EKG Avoid QTc prolonging agents Optimize  magnesium  and potassium levels.   Monitor on telemetry.   MGUS Outpatient follow-up with medical oncology.   Right upper lobe pulmonary nodule measuring 7 mm Outpatient follow-up.  DVT prophylaxis: enoxaparin   Code Status: Full  Family Communication:  Disposition: home    Consultants:  cardio Procedures:   Antimicrobials:    Subjective: Pt says she continues to feel better as we get more fluid off.    Objective: Vitals:   02/19/24 2058 02/20/24 0511 02/20/24 0944 02/20/24 1325  BP: 122/76 124/77 122/72 137/83  Pulse: 72 94 92 95  Resp:  19 16 (!) 23  Temp: (!) 97.5 F (36.4 C) 98 F (36.7 C) 98.1 F (36.7 C) 98.4 F (36.9 C)  TempSrc: Oral Oral Oral Oral  SpO2: 99% 100% 100% 100%  Weight:  106.1 kg    Height:        Intake/Output Summary (Last 24 hours) at 02/20/2024 1421 Last data filed at 02/20/2024 1324 Gross per 24 hour  Intake 840 ml  Output 400 ml  Net 440 ml   Filed Weights   02/18/24 0519 02/19/24 0515 02/20/24 0511  Weight: 108.2 kg 106.7 kg 106.1 kg   Examination:  General exam: Appears grossly volume overloaded;  calm and uncomfortable  Respiratory system: no increased work of breathing.   Cardiovascular system: normal S1 & S2 heard. mild JVD, No rubs, gallops or clicks. 2+ pedal edema. Gastrointestinal system: Abdomen is mildly distended, soft and nontender. No organomegaly or masses felt. Normal bowel sounds heard. Central nervous system: Alert and oriented. No focal neurological deficits. Extremities: 1+ pitting edema BLEs Symmetric 5 x 5 power. Skin: No rashes, lesions or ulcers. Psychiatry: Judgement and insight appear normal. Mood & affect appropriate.   Data Reviewed: I have personally reviewed following labs and imaging studies  CBC: Recent Labs  Lab 02/16/24 1728 02/18/24 0516  WBC 9.2 8.7  NEUTROABS 7.2  --   HGB 12.4 10.6*  HCT 39.7 33.3*  MCV 80.0 80.2  PLT 317 219    Basic Metabolic Panel: Recent Labs  Lab  02/16/24 1728 02/18/24 0516 02/19/24 0458 02/20/24 0554  NA 127* 130* 131* 133*  K 4.3 4.1 4.0 4.1  CL 93* 97* 97* 98  CO2 19* 28 30 28   GLUCOSE 196* 137* 153* 120*  BUN 31* 29* 27* 27*  CREATININE 1.36* 1.19* 1.17* 1.23*  CALCIUM  9.1 8.8* 8.8* 8.9  MG  --  1.8 1.9  --     CBG: Recent Labs  Lab 02/19/24 1138 02/19/24 1714 02/19/24 2057 02/20/24 0710 02/20/24 1145  GLUCAP 196* 155* 156* 121* 188*    No results found for this or any previous visit (from the past 240 hours).   Radiology Studies: DG CHEST PORT 1 VIEW Result Date: 02/20/2024 EXAM: 1 VIEW(S) XRAY OF THE CHEST 02/20/2024 06:21:04 AM COMPARISON: 02/18/2024 CLINICAL HISTORY: Acute heart failure (HCC); Pulmonary edema FINDINGS: LUNGS AND PLEURA: Decreased pulmonary edema. Hazy opacity overlying left lung persistent with silhouetting off of the left apex of the heart and left hemidiaphragm. Left carotid apex not visualized due to overlying lung disease. No pneumothorax.  HEART AND MEDIASTINUM: Stable cardiomegaly. Unchanged cardiomediastinal silhouette. 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 IMPRESSION: 1. Decreased pulmonary edema. 2. Persistent opacity of the left lower lung zone. The finding may represent a combination of pleural effusion, atelectasis, and lung disease. Electronically signed by: Morgane Naveau MD 02/20/2024 08:39 AM EST RP Workstation: [id number]   Scheduled Meds:  bisoprolol   5 mg Oral Daily   cycloSPORINE   1 drop Both Eyes BID   DULoxetine   60 mg Oral BID   enoxaparin  (LOVENOX ) injection  40 mg Subcutaneous Q24H   furosemide   80 mg Intravenous TID   insulin  aspart  0-5 Units Subcutaneous QHS   insulin  aspart  0-9 Units Subcutaneous TID WC   insulin  glargine  10 Units Subcutaneous Daily   lactulose   20 g Oral BID   levothyroxine   88 mcg Oral QAC breakfast   pantoprazole   40 mg Oral BID   potassium chloride  SA  20 mEq Oral Daily   rosuvastatin   5 mg Oral Daily    spironolactone   25 mg Oral Daily   traZODone   150 mg Oral QPM   ursodiol   300 mg Oral BID   Continuous Infusions:   LOS: 4 days   Time spent: 55 mins  Sekai Gitlin Vicci, MD How to contact the TRH Attending or Consulting provider 7A - 7P or covering provider during after hours 7P -7A, for this patient?  Check the care team in [facility] and look for a) attending/consulting TRH provider listed and b) the TRH team listed Log into [email or web address] to find provider on call.  Locate the TRH provider you are looking for under [facility] and page to a number that you can be directly reached. If you still have difficulty reaching the provider, please page the [facility] (Director on Call) for the Hospitalists listed on amion for assistance.  02/20/2024, 2:21 PM    "

## 2024-02-20 NOTE — Plan of Care (Signed)

## 2024-02-21 DIAGNOSIS — I4891 Unspecified atrial fibrillation: Secondary | ICD-10-CM | POA: Diagnosis not present

## 2024-02-21 DIAGNOSIS — I5023 Acute on chronic systolic (congestive) heart failure: Secondary | ICD-10-CM | POA: Diagnosis not present

## 2024-02-21 DIAGNOSIS — N1832 Chronic kidney disease, stage 3b: Secondary | ICD-10-CM

## 2024-02-21 DIAGNOSIS — I482 Chronic atrial fibrillation, unspecified: Secondary | ICD-10-CM

## 2024-02-21 DIAGNOSIS — K219 Gastro-esophageal reflux disease without esophagitis: Secondary | ICD-10-CM

## 2024-02-21 DIAGNOSIS — E039 Hypothyroidism, unspecified: Secondary | ICD-10-CM

## 2024-02-21 LAB — BASIC METABOLIC PANEL WITH GFR
Anion gap: 5 (ref 5–15)
BUN: 27 mg/dL — ABNORMAL HIGH (ref 8–23)
CO2: 29 mmol/L (ref 22–32)
Calcium: 8.7 mg/dL — ABNORMAL LOW (ref 8.9–10.3)
Chloride: 99 mmol/L (ref 98–111)
Creatinine, Ser: 1.29 mg/dL — ABNORMAL HIGH (ref 0.44–1.00)
GFR, Estimated: 44 mL/min — ABNORMAL LOW
Glucose, Bld: 89 mg/dL (ref 70–99)
Potassium: 4 mmol/L (ref 3.5–5.1)
Sodium: 133 mmol/L — ABNORMAL LOW (ref 135–145)

## 2024-02-21 LAB — BODY FLUID CELL COUNT WITH DIFFERENTIAL
Eos, Fluid: 0 %
Lymphs, Fluid: 21 %
Monocyte-Macrophage-Serous Fluid: 76 % (ref 50–90)
Neutrophil Count, Fluid: 3 % (ref 0–25)
Total Nucleated Cell Count, Fluid: 124 uL (ref 0–1000)

## 2024-02-21 LAB — GLUCOSE, CAPILLARY
Glucose-Capillary: 86 mg/dL (ref 70–99)
Glucose-Capillary: 130 mg/dL — ABNORMAL HIGH (ref 70–99)
Glucose-Capillary: 138 mg/dL — ABNORMAL HIGH (ref 70–99)
Glucose-Capillary: 157 mg/dL — ABNORMAL HIGH (ref 70–99)

## 2024-02-21 LAB — PRO BRAIN NATRIURETIC PEPTIDE: Pro Brain Natriuretic Peptide: 12078 pg/mL — ABNORMAL HIGH

## 2024-02-21 NOTE — Plan of Care (Signed)
" °  Problem: Acute Rehab OT Goals (only OT should resolve) Goal: Pt. Will Perform Grooming Flowsheets (Taken 02/21/2024 1343) Pt Will Perform Grooming: with modified independence Goal: Pt. Will Perform Lower Body Bathing Flowsheets (Taken 02/21/2024 1343) Pt Will Perform Lower Body Bathing:  with contact guard assist  with min assist  sitting/lateral leans  with adaptive equipment Goal: Pt. Will Perform Upper Body Dressing Flowsheets (Taken 02/21/2024 1343) Pt Will Perform Upper Body Dressing:  with modified independence  with set-up  sitting Goal: Pt. Will Perform Lower Body Dressing Flowsheets (Taken 02/21/2024 1343) Pt Will Perform Lower Body Dressing:  with min assist  with contact guard assist  with adaptive equipment  sitting/lateral leans Goal: Pt. Will Transfer To Toilet Flowsheets (Taken 02/21/2024 1343) Pt Will Transfer to Toilet:  with contact guard assist  stand pivot transfer Goal: Pt. Will Perform Toileting-Clothing Manipulation Flowsheets (Taken 02/21/2024 1343) Pt Will Perform Toileting - Clothing Manipulation and hygiene:  with contact guard assist  with min assist  sitting/lateral leans Goal: Pt/Caregiver Will Perform Home Exercise Program Flowsheets (Taken 02/21/2024 1343) Pt/caregiver will Perform Home Exercise Program:  Increased strength  Increased ROM  Both right and left upper extremity  Independently  Riot Waterworth OT, MOT  "

## 2024-02-21 NOTE — Progress Notes (Signed)
 Mobility Specialist Progress Note:    02/21/24 1610  Mobility  Activity Pivoted/transferred from chair to bed  Level of Assistance Moderate assist, patient does 50-74%  Assistive Device Front wheel walker  Distance Ambulated (ft) 3 ft  Range of Motion/Exercises Active;All extremities  Activity Response Tolerated well  Mobility Referral Yes  Mobility visit 1 Mobility  Mobility Specialist Start Time (ACUTE ONLY) 1610  Mobility Specialist Stop Time (ACUTE ONLY) 1630  Mobility Specialist Time Calculation (min) (ACUTE ONLY) 20 min   Pt received in chair, assisting RN transfer back to bed. Required ModA to stand and transfer with RW. Tolerated well, RN in room. All needs met.  Stephanie [name] Mobility Specialist Please contact via Special Educational Needs Teacher or  Rehab office at [phone]

## 2024-02-21 NOTE — Progress Notes (Signed)
 "  Cardiologist:  Mallipeddi  Subjective:   Dyspnea improved still with considerable LE edema  Objective:  Vitals:   02/20/24 1325 02/20/24 2025 02/21/24 0457 02/21/24 0800  BP: 137/83 133/86 132/80   Pulse: 95 91 100 95  Resp: (!) 23 20 16 16   Temp: 98.4 F (36.9 C) 97.9 F (36.6 C) 98.1 F (36.7 C)   TempSrc: Oral Oral Oral   SpO2: 100% 100% 96%   Weight:   105.8 kg   Height:        Intake/Output from previous day:  Intake/Output Summary (Last 24 hours) at 02/21/2024 0850 Last data filed at 02/21/2024 0300 Gross per 24 hour  Intake 480 ml  Output 900 ml  Net -420 ml    Physical Exam:  Chronically ill black female Lungs clear Abdomen no obvious ascites No murmur JVP elevated with V wave  Plus 203 tense bilateral LE edema  Lab Results: Basic Metabolic Panel: Recent Labs    02/19/24 0458 02/20/24 0554 02/21/24 0539  NA 131* 133* 133*  K 4.0 4.1 4.0  CL 97* 98 99  CO2 30 28 29   GLUCOSE 153* 120* 89  BUN 27* 27* 27*  CREATININE 1.17* 1.23* 1.29*  CALCIUM  8.8* 8.9 8.7*  MG 1.9  --   --      Imaging: DG CHEST PORT 1 VIEW Result Date: 02/20/2024 EXAM: 1 VIEW(S) XRAY OF THE CHEST 02/20/2024 06:21:04 AM COMPARISON: 02/18/2024 CLINICAL HISTORY: Acute heart failure (HCC); Pulmonary edema FINDINGS: LUNGS AND PLEURA: Decreased pulmonary edema. Hazy opacity overlying left lung persistent with silhouetting off of the left apex of the heart and left hemidiaphragm. Left carotid apex not visualized due to overlying lung disease. No pneumothorax. HEART AND MEDIASTINUM: Stable cardiomegaly. Unchanged cardiomediastinal silhouette. 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 IMPRESSION: 1. Decreased pulmonary edema. 2. Persistent opacity of the left lower lung zone. The finding may represent a combination of pleural effusion, atelectasis, and lung disease. Electronically signed by: Morgane Naveau MD 02/20/2024 08:39 AM EST RP Workstation:  [id number]    Cardiac Studies:  ECG: afib rate 108 low voltage    Telemetry:  afib  Echo: EF 25-30% severe RV dysfunction mild/mod MR , mod/severe TR  Medications:    bisoprolol   5 mg Oral Daily   cycloSPORINE   1 drop Both Eyes BID   DULoxetine   60 mg Oral BID   enoxaparin  (LOVENOX ) injection  40 mg Subcutaneous Q24H   furosemide   80 mg Intravenous TID   insulin  aspart  0-5 Units Subcutaneous QHS   insulin  aspart  0-9 Units Subcutaneous TID WC   insulin  glargine  10 Units Subcutaneous Daily   lactulose   20 g Oral BID   levothyroxine   88 mcg Oral QAC breakfast   pantoprazole   40 mg Oral BID   potassium chloride  SA  20 mEq Oral Daily   rosuvastatin   5 mg Oral Daily   spironolactone   25 mg Oral Daily   traZODone   150 mg Oral QPM   ursodiol   300 mg Oral BID      Assessment/Plan:   Chronic biventricular systolic CHF:  palliative patient per CHF/Dr Mallipeddi. Continue iv diuresis and oral aldactone  lungs better but with right heart failure / ascites has considerable LE edema. Not a candidate for inotropic support Right heart done 11/26/23 with RA 27, PA 48/26 mmHg with CI 1.8 and lor PAPi index Afib:  rates ok with bisoprolol  Previously on eliquis  for DVT History of 2 U  GI bleed 11/25 and not felt to be candidate for DOAC per Dr Mallipeddi  Stephanie [name] 02/21/2024, 8:50 AM    "

## 2024-02-21 NOTE — TOC Progression Note (Signed)
 Transition of Care [facility]) - Progression Note    Patient Details  Name: TARAH BUBOLTZ MRN: [id number] Date of Birth: [birth year]  Transition of Care [facility]) CM/SW Contact  [place] Saddie Kim, [place] Phone Number: 02/21/2024, 1:45 PM  Clinical Narrative:  PT recommending SNF. LCSW discussed with pt who adamantly refuses. She states she was just d/c from SNF and will not return. Pt indicates her daughter is aware of the plan to return home, but LCSW encouraged her to also discuss recommendations with her daughter.       Expected Discharge Plan: Home/Self Care Barriers to Discharge: Continued Medical Work up               Expected Discharge Plan and Services In-house Referral: Clinical Social Work Discharge Planning Services: NA Post Acute Care Choice: NA Living arrangements for the past 2 months: Single Family Home                 DME Arranged: N/A DME Agency: NA                   Social Drivers of Health (SDOH) Interventions SDOH Screenings   Food Insecurity: No Food Insecurity (02/16/2024)  Housing: Low Risk (02/16/2024)  Transportation Needs: No Transportation Needs (02/16/2024)  Utilities: Not At Risk (02/16/2024)  Depression (PHQ2-9): Low Risk (09/14/2023)  Social Connections: Moderately Isolated (02/16/2024)  Tobacco Use: Medium Risk (02/17/2024)    Readmission Risk Interventions    02/17/2024   11:46 AM 12/27/2023   10:08 AM 12/25/2023   10:02 AM  Readmission Risk Prevention Plan  Transportation Screening Complete Complete Complete  PCP or Specialist Appt within 3-5 Days Complete    HRI or Home Care Consult Complete    Social Work Consult for Recovery Care Planning/Counseling Complete    Palliative Care Screening Not Applicable    Medication Review Oceanographer) Complete Complete Complete  PCP or Specialist appointment within 3-5 days of discharge  Complete   HRI or Home Care Consult  Complete Complete  SW Recovery Care/Counseling Consult  Complete  Complete  Palliative Care Screening  Complete Not Applicable  Skilled Nursing Facility  Complete Complete

## 2024-02-21 NOTE — Evaluation (Signed)
 Occupational Therapy Evaluation Patient Details Name: Stephanie [name] MRN: [id number] DOB: 06-09-1950 Today's Date: 02/21/2024   History of Present Illness   Stephanie [name] is a 74 y.o. female with medical history significant for MGUS, chronic HFrEF, history of DVT (was taken off Eliquis  prior to admission due to history of GI bleed), CVA, CKD 3B, COPD/asthma, chronic hypoxia on 3-4 L [place] at baseline, fibromyalgia, GERD, type 2 diabetes, hypertension, hyperlipidemia, hypothyroidism, obesity, who presents to the ER with complaints of shortness of breath x 3 days and worsening.  Associated with generalized swelling.  Endorses compliance with home medications.  She does not weigh herself daily.  EMS was activated.  Upon EMS arrival, the patient was hypoxic with O2 saturation in the 66 to 70% on room air.  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  She was placed on 15 L nonrebreather and brought into the ER for further evaluation. (per DO)     Clinical Impressions Pt agreeable to OT evaluation. Pt already seated in the chair to start the session. Pt demonstrates B UE limited A/ROM at the shoulder and general weakness overall. Pt required max A for sit to stand from chair with RW and could only remain standing for a few seconds before needing to sit. Pt declined to try another rep of sit to stand with RW. Pt appears to be very weak and fatigue easily. Pt reports being home alone at times with daughter to assist PRN. Pt demonstrates need for mod to max A for LB bathing and max to total for lower body bathing. Pt left in the chair with call bell within reach and chair alarm set. Pt will benefit from continued OT in the hospital to increase strength, balance, and endurance for safe ADL's.        If plan is discharge home, recommend the following:   A lot of help with walking and/or transfers;A lot of help with bathing/dressing/bathroom;Assistance with cooking/housework;Assist for transportation;Help with stairs or ramp for  entrance     Functional Status Assessment   Patient has had a recent decline in their functional status and demonstrates the ability to make significant improvements in function in a reasonable and predictable amount of time.     Equipment Recommendations   None recommended by OT             Precautions/Restrictions   Precautions Precautions: Fall Recall of Precautions/Restrictions: Intact Restrictions Weight Bearing Restrictions Per Provider Order: No     Mobility Bed Mobility               General bed mobility comments: Pt seated in the chair to start today's session. Declining futher attempts at standing from chair/going to the bed.    Transfers Overall transfer level: Needs assistance Equipment used: Rolling walker (2 wheels) Transfers: Sit to/from Stand Sit to Stand: Max assist           General transfer comment: ~2 attempts at sit to stand with one successful with max A using RW.      Balance Overall balance assessment: Needs assistance Sitting-balance support: Feet supported, Bilateral upper extremity supported Sitting balance-Leahy Scale: Fair Sitting balance - Comments: seated in chair   Standing balance support: Bilateral upper extremity supported, During functional activity, Reliant on assistive device for balance Standing balance-Leahy Scale: Poor Standing balance comment: using RW                           ADL either performed  or assessed with clinical judgement   ADL Overall ADL's : Needs assistance/impaired     Grooming: Set up;Sitting   Upper Body Bathing: Minimal assistance;Set up;Sitting   Lower Body Bathing: Maximal assistance;Sitting/lateral leans;Moderate assistance   Upper Body Dressing : Set up;Minimal assistance;Sitting   Lower Body Dressing: Maximal assistance;Total assistance;Sitting/lateral leans   Toilet Transfer: Maximal assistance;Stand-pivot;Rolling walker (2 wheels) Toilet Transfer Details  (indicate cue type and reason): Partially simulated via sit to stand with RW. Toileting- Clothing Manipulation and Hygiene: Maximal assistance;Bed level;Sitting/lateral lean               Vision Baseline Vision/History: 1 Wears glasses Ability to See in Adequate Light: 0 Adequate Patient Visual Report: No change from baseline Vision Assessment?: Wears glasses for reading     Perception Perception: Not tested       Praxis Praxis: Not tested       Pertinent Vitals/Pain Pain Assessment Pain Assessment: No/denies pain Faces Pain Scale: No hurt     Extremity/Trunk Assessment Upper Extremity Assessment Upper Extremity Assessment: Generalized weakness (3-/5 shoulder flexion bilaterally; generally weak otherwise.)   Lower Extremity Assessment Lower Extremity Assessment: Defer to PT evaluation   Cervical / Trunk Assessment Cervical / Trunk Assessment: Kyphotic   Communication Communication Communication: No apparent difficulties   Cognition Arousal: Alert Behavior During Therapy: WFL for tasks assessed/performed Cognition: No apparent impairments                               Following commands: Intact       Cueing  General Comments   Cueing Techniques: Verbal cues;Tactile cues                 Home Living Family/patient expects to be discharged to:: Private residence Living Arrangements: Children Available Help at Discharge: Family;Available PRN/intermittently Type of Home: House Home Access: Stairs to enter [employer] of Steps: 1 Entrance Stairs-Rails: None Home Layout: One level     Bathroom Shower/Tub: Chief Strategy Officer: Handicapped height Bathroom Accessibility: No   Home Equipment: Agricultural Consultant (2 wheels);Cane - single point;BSC/3in1;Wheelchair - manual;Shower seat;Lift chair (Pt reports she is getting a hospital bed.)          Prior Functioning/Environment Prior Level of Function : Needs  assist       Physical Assist : ADLs (physical);Mobility (physical) Mobility (physical): Bed mobility;Transfers;Gait;Stairs ADLs (physical): Bathing;Dressing Mobility Comments: household ambulation using RW ADLs Comments: Pt reports assist for washing her back and for donning socks. IADL assist.    OT Problem List: Decreased strength;Decreased range of motion;Decreased activity tolerance;Impaired balance (sitting and/or standing)   OT Treatment/Interventions: Self-care/ADL training;Therapeutic exercise;DME and/or AE instruction;Therapeutic activities;Patient/family education;Balance training      OT Goals(Current goals can be found in the care plan section)   Acute Rehab OT Goals Patient Stated Goal: Return home. OT Goal Formulation: With patient Time For Goal Achievement: 03/06/24 Potential to Achieve Goals: Fair   OT Frequency:  Min 2X/week                                   End of Session Equipment Utilized During Treatment: Gait belt;Rolling walker (2 wheels);Oxygen   Activity Tolerance: Patient tolerated treatment well Patient left: in chair;with call bell/phone within reach;with chair alarm set  OT Visit Diagnosis: Unsteadiness on feet (R26.81);Other abnormalities of gait and mobility (R26.89);Muscle weakness (generalized) (  M62.81);History of falling (Z91.81)                Time: 8886-8870 OT Time Calculation (min): 16 min Charges:  OT General Charges $OT Visit: 1 Visit OT Evaluation $OT Eval Low Complexity: 1 Low  Stiles Maxcy OT, MOT  Jayson Person 02/21/2024, 1:41 PM

## 2024-02-21 NOTE — Plan of Care (Signed)

## 2024-02-21 NOTE — Progress Notes (Signed)
 " PROGRESS NOTE   Stephanie [name]  FMW:[id number] DOB: [birth year] DOA: 02/16/2024 PCP: [facility]   Chief Complaint  Patient presents with   Shortness of Breath   Level of care: Telemetry  Brief Admission History:  74 y.o. female with medical history significant for MGUS, chronic HFrEF, history of DVT (was taken off Eliquis  prior to admission due to history of GI bleed), CVA, CKD 3B, COPD/asthma, chronic hypoxia on 3-4 L [place] at baseline, fibromyalgia, GERD, type 2 diabetes, hypertension, hyperlipidemia, hypothyroidism, obesity, who presents to the ER with complaints of shortness of breath x 3 days and worsening.  Associated with generalized swelling.  Endorses compliance with home medications.  She does not weigh herself daily.  EMS was activated.  Upon EMS arrival, the patient was hypoxic with O2 saturation in the 66 to 70% on room air.  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  She was placed on 15 L nonrebreather and brought into the ER for further evaluation.   In the ER, pt was notably tachycardic and tachypneic.  Chest x-ray showed left lung base airspace opacity and moderate left pleural effusion.  Mild pulmonary edema.  Stable cardiomegaly.  Right upper lobe pulmonary nodule measuring 7 mm.  Lab studies notable for proBNP greater than 15,000.  Twelve-lead EKG, atrial fibrillation with a rate of 108, QTc 534.   The patient received IV Lasix  80 mg x 1.  TRH, hospitalist service, was asked to admit for further management of acute on chronic HFrEF.   ED Course: Temperature 97.6.  BP 151/97, pulse 103, respiratory rate 23.  O2 saturation 67% on room air.   Assessment and Plan:  Acute on chronic HFrEF Presented with cardiomegaly, pulmonary edema, left pleural effusion, severely marked volume overload. Last 2D echo done on 11/17/2023 revealed LVEF 35 to 40% IV furosemide  40 mg BID, increased by cardiology to 80 mg IV TID on 02/18/24  Monitor strict I's and O's and daily weight Follow repeat transthoracic  echocardiogram Cardiology consult recommendations as ordered    [facility] Weights   02/19/24 0515 02/20/24 0511 02/21/24 0457  Weight: 106.7 kg 106.1 kg 105.8 kg    Intake/Output Summary (Last 24 hours) at 02/21/2024 1008 Last data filed at 02/21/2024 0300 Gross per 24 hour  Intake 480 ml  Output 900 ml  Net -420 ml   Moderate left pleural effusion US  thoracentesis requested for symptom relief with fluid studies ordered Pt had thoracentesis on 02/18/24 with 100 mL fluid removed   Hypothyroidism Follow TSH Resumed home levothyroxine    Type 2 diabetes with hyperglycemia Hemoglobin A1c 6.6 on 11/23/2023 Presented with serum glucose of 196 Heart healthy carb modified diet Supplemental sliding scale insulin  coverage. CBG (last 3)  Recent Labs    02/20/24 1603 02/20/24 2008 02/21/24 0727  GLUCAP 151* 174* 86    CKD 3B Renal function appears to be at baseline 1.26 with GFR of 41. Monitor urine output Follow daily BMP    Advanced hepatic fibrosis Chronic hepatic fibrosis With chronic mildly abnormal LFTs, monitor GI recommended to continue lactulose , if patient refuses and recurrent AMS then start Xifaxan  550 mg BID So far, pt has been taking the lactulose    Elevated liver chemistries Alkaline phosphatase vitamin D , AST 48, T. bili 2.3 Avoid hepatotoxic agents Hepatic function panel stable    Hypervolemic hyponatremia Serum sodium 127>>131 Continue diuresing Repeat BMP daily while diuresing   Non anion gap metabolic acidosis Serum bicarb 19, anion gap 15 Continue to monitor   GERD Resume  home PPI   Hyperlipidemia Resume home Crestor    Obesity BMI 36 Recommend weight loss outpatient with regular physical activity and healthy dieting   History of DVT in 2017 Was taken off Eliquis  prior to admission due to history of GI bleed Continue subcu insulin  for DVT prophylaxis.   QTc prolongation. QTc 534 on admission 12 EKG Avoid QTc prolonging agents Optimize  magnesium  and potassium levels.   Monitor on telemetry.   MGUS Outpatient follow-up with medical oncology.   Right upper lobe pulmonary nodule measuring 7 mm Outpatient follow-up.  DVT prophylaxis: enoxaparin   Code Status: Full  Family Communication:  Disposition: home    Consultants:  cardio Procedures:   Antimicrobials:    Subjective: Pt still having a lot of fluid in her legs.    Objective: Vitals:   02/20/24 1325 02/20/24 2025 02/21/24 0457 02/21/24 0800  BP: 137/83 133/86 132/80   Pulse: 95 91 100 95  Resp: (!) 23 20 16 16   Temp: 98.4 F (36.9 C) 97.9 F (36.6 C) 98.1 F (36.7 C)   TempSrc: Oral Oral Oral   SpO2: 100% 100% 96%   Weight:   105.8 kg   Height:        Intake/Output Summary (Last 24 hours) at 02/21/2024 1008 Last data filed at 02/21/2024 0300 Gross per 24 hour  Intake 480 ml  Output 900 ml  Net -420 ml   Filed Weights   02/19/24 0515 02/20/24 0511 02/21/24 0457  Weight: 106.7 kg 106.1 kg 105.8 kg   Examination:  General exam: Appears grossly volume overloaded;  calm and uncomfortable  Respiratory system: no increased work of breathing.   Cardiovascular system: normal S1 & S2 heard. mild JVD, No rubs, gallops or clicks. 2+ pedal edema. Gastrointestinal system: Abdomen is mildly distended, soft and nontender. No organomegaly or masses felt. Normal bowel sounds heard. Central nervous system: Alert and oriented. No focal neurological deficits. Extremities: 2+ pitting edema BLEs Symmetric 5 x 5 power. Skin: No rashes, lesions or ulcers. Psychiatry: Judgement and insight appear normal. Mood & affect appropriate.   Data Reviewed: I have personally reviewed following labs and imaging studies  CBC: Recent Labs  Lab 02/16/24 1728 02/18/24 0516  WBC 9.2 8.7  NEUTROABS 7.2  --   HGB 12.4 10.6*  HCT 39.7 33.3*  MCV 80.0 80.2  PLT 317 219    Basic Metabolic Panel: Recent Labs  Lab 02/16/24 1728 02/18/24 0516 02/19/24 0458 02/20/24 0554  02/21/24 0539  NA 127* 130* 131* 133* 133*  K 4.3 4.1 4.0 4.1 4.0  CL 93* 97* 97* 98 99  CO2 19* 28 30 28 29   GLUCOSE 196* 137* 153* 120* 89  BUN 31* 29* 27* 27* 27*  CREATININE 1.36* 1.19* 1.17* 1.23* 1.29*  CALCIUM  9.1 8.8* 8.8* 8.9 8.7*  MG  --  1.8 1.9  --   --     CBG: Recent Labs  Lab 02/20/24 0710 02/20/24 1145 02/20/24 1603 02/20/24 2008 02/21/24 0727  GLUCAP 121* 188* 151* 174* 86    No results found for this or any previous visit (from the past 240 hours).   Radiology Studies: DG CHEST PORT 1 VIEW Result Date: 02/20/2024 EXAM: 1 VIEW(S) XRAY OF THE CHEST 02/20/2024 06:21:04 AM COMPARISON: 02/18/2024 CLINICAL HISTORY: Acute heart failure (HCC); Pulmonary edema FINDINGS: LUNGS AND PLEURA: Decreased pulmonary edema. Hazy opacity overlying left lung persistent with silhouetting off of the left apex of the heart and left hemidiaphragm. Left carotid apex not visualized due  to overlying lung disease. No pneumothorax. HEART AND MEDIASTINUM: Stable cardiomegaly. Unchanged cardiomediastinal silhouette. 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 IMPRESSION: 1. Decreased pulmonary edema. 2. Persistent opacity of the left lower lung zone. The finding may represent a combination of pleural effusion, atelectasis, and lung disease. Electronically signed by: Morgane Naveau MD 02/20/2024 08:39 AM EST RP Workstation: [id number]   Scheduled Meds:  bisoprolol   5 mg Oral Daily   cycloSPORINE   1 drop Both Eyes BID   DULoxetine   60 mg Oral BID   enoxaparin  (LOVENOX ) injection  40 mg Subcutaneous Q24H   furosemide   80 mg Intravenous TID   insulin  aspart  0-5 Units Subcutaneous QHS   insulin  aspart  0-9 Units Subcutaneous TID WC   insulin  glargine  10 Units Subcutaneous Daily   lactulose   20 g Oral BID   levothyroxine   88 mcg Oral QAC breakfast   pantoprazole   40 mg Oral BID   potassium chloride  SA  20 mEq Oral Daily   rosuvastatin   5 mg Oral Daily    spironolactone   25 mg Oral Daily   traZODone   150 mg Oral QPM   ursodiol   300 mg Oral BID   Continuous Infusions:   LOS: 5 days   Time spent: 55 mins  Oran Dillenburg Vicci, MD How to contact the TRH Attending or Consulting provider 7A - 7P or covering provider during after hours 7P -7A, for this patient?  Check the care team in [facility] and look for a) attending/consulting TRH provider listed and b) the TRH team listed Log into [email or web address] to find provider on call.  Locate the TRH provider you are looking for under [facility] and page to a number that you can be directly reached. If you still have difficulty reaching the provider, please page the [facility] (Director on Call) for the Hospitalists listed on amion for assistance.  02/21/2024, 10:08 AM    "

## 2024-02-21 NOTE — TOC Progression Note (Signed)
 Transition of Care [facility]) - Progression Note    Patient Details  Name: Stephanie [name] MRN: [id number] Date of Birth: 02-15-1951  Transition of Care [facility]) CM/SW Contact  Mcarthur Saddie Kim, [place] Phone Number: 02/21/2024, 8:18 AM  Clinical Narrative: Pt has no preference on home health agency. Sarah with [facility] accepts. Will need orders prior to d/c.      Expected Discharge Plan: Home/Self Care Barriers to Discharge: Continued Medical Work up               Expected Discharge Plan and Services In-house Referral: Clinical Social Work Discharge Planning Services: NA Post Acute Care Choice: NA Living arrangements for the past 2 months: Single Family Home                 DME Arranged: N/A DME Agency: NA                   Social Drivers of Health (SDOH) Interventions SDOH Screenings   Food Insecurity: No Food Insecurity (02/16/2024)  Housing: Low Risk (02/16/2024)  Transportation Needs: No Transportation Needs (02/16/2024)  Utilities: Not At Risk (02/16/2024)  Depression (PHQ2-9): Low Risk (09/14/2023)  Social Connections: Moderately Isolated (02/16/2024)  Tobacco Use: Medium Risk (02/17/2024)    Readmission Risk Interventions    02/17/2024   11:46 AM 12/27/2023   10:08 AM 12/25/2023   10:02 AM  Readmission Risk Prevention Plan  Transportation Screening Complete Complete Complete  PCP or Specialist Appt within 3-5 Days Complete    HRI or Home Care Consult Complete    Social Work Consult for Recovery Care Planning/Counseling Complete    Palliative Care Screening Not Applicable    Medication Review Oceanographer) Complete Complete Complete  PCP or Specialist appointment within 3-5 days of discharge  Complete   HRI or Home Care Consult  Complete Complete  SW Recovery Care/Counseling Consult  Complete Complete  Palliative Care Screening  Complete Not Applicable  Skilled Nursing Facility  Complete Complete

## 2024-02-22 ENCOUNTER — Ambulatory Visit: Admitting: Nurse Practitioner

## 2024-02-22 DIAGNOSIS — I5023 Acute on chronic systolic (congestive) heart failure: Secondary | ICD-10-CM | POA: Diagnosis not present

## 2024-02-22 LAB — BASIC METABOLIC PANEL WITH GFR
Anion gap: 6 (ref 5–15)
BUN: 26 mg/dL — ABNORMAL HIGH (ref 8–23)
CO2: 29 mmol/L (ref 22–32)
Calcium: 8.8 mg/dL — ABNORMAL LOW (ref 8.9–10.3)
Chloride: 99 mmol/L (ref 98–111)
Creatinine, Ser: 1.42 mg/dL — ABNORMAL HIGH (ref 0.44–1.00)
GFR, Estimated: 39 mL/min — ABNORMAL LOW
Glucose, Bld: 114 mg/dL — ABNORMAL HIGH (ref 70–99)
Potassium: 3.9 mmol/L (ref 3.5–5.1)
Sodium: 134 mmol/L — ABNORMAL LOW (ref 135–145)

## 2024-02-22 LAB — PATHOLOGIST SMEAR REVIEW

## 2024-02-22 LAB — ALBUMIN, FLUID (OTHER): Albumin, Body Fluid Other: 0.4 g/dL

## 2024-02-22 LAB — GLUCOSE, CAPILLARY
Glucose-Capillary: 110 mg/dL — ABNORMAL HIGH (ref 70–99)
Glucose-Capillary: 177 mg/dL — ABNORMAL HIGH (ref 70–99)
Glucose-Capillary: 176 mg/dL — ABNORMAL HIGH (ref 70–99)

## 2024-02-22 LAB — LD, BODY FLUID (OTHER): LD, Body Fluid: 79 IU/L

## 2024-02-22 LAB — GLUCOSE, BODY FLUID OTHER: Glucose, Body Fluid Other: 150 mg/dL

## 2024-02-22 NOTE — Progress Notes (Signed)
 Mobility Specialist Progress Note:    02/22/24 0925  Mobility  Activity Pivoted/transferred to/from [facility]  Level of Assistance Moderate assist, patient does 50-74%  Assistive Device None  Distance Ambulated (ft) 2 ft  Range of Motion/Exercises Active;All extremities  Activity Response Tolerated well  Mobility Referral Yes  Mobility visit 1 Mobility  Mobility Specialist Start Time (ACUTE ONLY) [id number]  Mobility Specialist Stop Time (ACUTE ONLY) 0945  Mobility Specialist Time Calculation (min) (ACUTE ONLY) 20 min   Pt received sitting EOB, NT requesting assistance transferring to [facility]. Required ModA to stand and transfer with no AD. Tolerated well, asx throughout. NT in room, all needs met.  Danajah Birdsell Mobility Specialist Please contact via Special Educational Needs Teacher or  Rehab office at [phone]

## 2024-02-22 NOTE — Progress Notes (Signed)
 "  Rounding Note   Patient Name: Solita Macadam Bertone Date of Encounter: 02/22/2024  [facility] Cardiologist: Jayson Sierras, MD   Subjective Some ongoing SOB  Scheduled Meds:  bisoprolol   5 mg Oral Daily   cycloSPORINE   1 drop Both Eyes BID   DULoxetine   60 mg Oral BID   enoxaparin  (LOVENOX ) injection  40 mg Subcutaneous Q24H   furosemide   80 mg Intravenous TID   insulin  aspart  0-5 Units Subcutaneous QHS   insulin  aspart  0-9 Units Subcutaneous TID WC   insulin  glargine  10 Units Subcutaneous Daily   lactulose   20 g Oral BID   levothyroxine   88 mcg Oral QAC breakfast   pantoprazole   40 mg Oral BID   potassium chloride  SA  20 mEq Oral Daily   rosuvastatin   5 mg Oral Daily   spironolactone   25 mg Oral Daily   traZODone   150 mg Oral QPM   ursodiol   300 mg Oral BID   Continuous Infusions:  PRN Meds: acetaminophen , albuterol , lidocaine , melatonin, methocarbamol , mouth rinse, polyethylene glycol, prochlorperazine , traMADol    Vital Signs  Vitals:   02/21/24 1834 02/21/24 1900 02/21/24 2026 02/22/24 0417  BP: 120/84 118/77 118/77 115/73  Pulse: 95 92 92 88  Resp:   18 20  Temp: 99.6 F (37.6 C) 98.7 F (37.1 C) 98.7 F (37.1 C) 98.6 F (37 C)  TempSrc: Oral Oral Oral Oral  SpO2: 100% 100% 100% 100%  Weight:    105.1 kg  Height:        Intake/Output Summary (Last 24 hours) at 02/22/2024 0819 Last data filed at 02/22/2024 0602 Gross per 24 hour  Intake 960 ml  Output 1350 ml  Net -390 ml      02/22/2024    4:17 AM 02/21/2024    4:57 AM 02/20/2024    5:11 AM  Last 3 Weights  Weight (lbs) 231 lb 11.3 oz 233 lb 4 oz 233 lb 14.5 oz  Weight (kg) 105.1 kg 105.8 kg 106.1 kg      Telemetry SR, PACs - Personally Reviewed  ECG  N/a - Personally Reviewed  Physical Exam  GEN: No acute distress.   Neck: + JVD Cardiac: RRR, no murmurs, rubs, or gallops.  Respiratory: Clear to auscultation bilaterally. GI: Soft, nontender, non-distended  MS: 1+ bilateral LE  edema Neuro:  Nonfocal  Psych: Normal affect   Labs High Sensitivity Troponin:  No results for input(s): TROPONINIHS in the last 720 hours. No results for input(s): TRNPT in the last 720 hours.     Chemistry Recent Labs  Lab 02/16/24 1728 02/18/24 0516 02/19/24 0458 02/20/24 0554 02/21/24 0539 02/22/24 0441  NA 127* 130* 131* 133* 133* 134*  K 4.3 4.1 4.0 4.1 4.0 3.9  CL 93* 97* 97* 98 99 99  CO2 19* 28 30 28 29 29   GLUCOSE 196* 137* 153* 120* 89 114*  BUN 31* 29* 27* 27* 27* 26*  CREATININE 1.36* 1.19* 1.17* 1.23* 1.29* 1.42*  CALCIUM  9.1 8.8* 8.8* 8.9 8.7* 8.8*  MG  --  1.8 1.9  --   --   --   PROT 9.2* 7.6  --   --   --   --   ALBUMIN  3.3* 2.9*  --   --   --   --   AST 48* 33  --   --   --   --   ALT 26 20  --   --   --   --  ALKPHOS 580* 505*  --   --   --   --   BILITOT 2.3* 1.9*  --   --   --   --   GFRNONAA 41* 48* 49* 46* 44* 39*  ANIONGAP 15 6 4* 7 5 6     Lipids No results for input(s): CHOL, TRIG, HDL, LABVLDL, LDLCALC, CHOLHDL in the last 168 hours.  Hematology Recent Labs  Lab 02/16/24 1728 02/18/24 0516  WBC 9.2 8.7  RBC 4.96 4.15  HGB 12.4 10.6*  HCT 39.7 33.3*  MCV 80.0 80.2  MCH 25.0* 25.5*  MCHC 31.2 31.8  RDW 15.7* 15.7*  PLT 317 219   Thyroid   Recent Labs  Lab 02/16/24 1743  TSH 6.110*    BNP Recent Labs  Lab 02/19/24 0458 02/20/24 0554 02/21/24 0539  PROBNP 12,437.0* 12,077.0* 12,078.0*    DDimer No results for input(s): DDIMER in the last 168 hours.   Radiology  No results found.  Cardiac Studies   Patient Profile   Ms. Hopf is a 74 year old F known to have severe biventricular heart failure with end-stage physiology, CKD stage IIIb, HTN, DM 2 is currently admitted to hospitalist team for the management of acute on chronic systolic and diastolic heart failure.   Assessment & Plan  1.Severe biventricular heart failure - 11/2023 RHC: mean PA 36, PCWP 22, CI 1.8 - Jan 2026 echo: LVEF 25-30%, indet  diastolic functin, D shaped septum, severe RV dysfunction.  - CXR moderate left pleural effsuion, mild pulm edema. proBNP 1536 -followed by advanced HF, likely endstage with prior consultations with palliative. Not a candidate for advanced therapies  - she is on IV lasix  80mg  tid. I/Os are incomplete. By weights 238-->231 lbs this admission, these are bed weights, will ask for daily standing weights.  Mild variation in Cr without clear trend. Remains fluid overloaded, continue IV diuresis.   - medical therapy with bisoprolol , aldactone  25mg  daily. GFR labile 30 to 49, has limited other medical therapy  2.Afib - new diagnosis this admission - history of GI bleed Nov 2025 while on eliquis  for DVT, she turned down further workup at that time.  - from prior cards notes felt too high risk for anticoag at this time - continue rate control - tele looks like probably SR with PACs, update EKG to verify     For questions or updates, please contact [facility] Please consult [email or web address] for contact info under   Signed, Alvan Carrier, MD  02/22/2024, 8:19 AM    "

## 2024-02-22 NOTE — Plan of Care (Signed)

## 2024-02-22 NOTE — Progress Notes (Signed)
 " PROGRESS NOTE   Stephanie [name]  FMW:[id number] DOB: March 21, 1950 DOA: 02/16/2024 PCP: [facility]   Chief Complaint  Patient presents with   Shortness of Breath   Level of care: Telemetry  Brief Admission History:  74 y.o. female with medical history significant for MGUS, chronic HFrEF, history of DVT (was taken off Eliquis  prior to admission due to history of GI bleed), CVA, CKD 3B, COPD/asthma, chronic hypoxia on 3-4 L [place] at baseline, fibromyalgia, GERD, type 2 diabetes, hypertension, hyperlipidemia, hypothyroidism, obesity, who presents to the ER with complaints of shortness of breath x 3 days and worsening.  Associated with generalized swelling.  Endorses compliance with home medications.  She does not weigh herself daily.  EMS was activated.  Upon EMS arrival, the patient was hypoxic with O2 saturation in the 66 to 70% on room air.  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  She was placed on 15 L nonrebreather and brought into the ER for further evaluation.   In the ER, pt was notably tachycardic and tachypneic.  Chest x-ray showed left lung base airspace opacity and moderate left pleural effusion.  Mild pulmonary edema.  Stable cardiomegaly.  Right upper lobe pulmonary nodule measuring 7 mm.  Lab studies notable for proBNP greater than 15,000.  Twelve-lead EKG, atrial fibrillation with a rate of 108, QTc 534.   The patient received IV Lasix  80 mg x 1.  TRH, hospitalist service, was asked to admit for further management of acute on chronic HFrEF.   ED Course: Temperature 97.6.  BP 151/97, pulse 103, respiratory rate 23.  O2 saturation 67% on room air.   Assessment and Plan:  Acute on chronic HFrEF Presented with cardiomegaly, pulmonary edema, left pleural effusion, severely marked volume overload. Last 2D echo done on 11/17/2023 revealed LVEF 35 to 40% IV furosemide  40 mg BID, increased by cardiology to 80 mg IV TID on 02/18/24, continue for now Monitor strict I's and O's and daily weight Follow  repeat transthoracic echocardiogram Cardiology consult recommendations as ordered    Filed Weights   02/20/24 0511 02/21/24 0457 02/22/24 0417  Weight: 106.1 kg 105.8 kg 105.1 kg    Intake/Output Summary (Last 24 hours) at 02/22/2024 1042 Last data filed at 02/22/2024 0602 Gross per 24 hour  Intake 720 ml  Output 1350 ml  Net -630 ml   Moderate left pleural effusion US  thoracentesis requested for symptom relief with fluid studies ordered Pt had thoracentesis on 02/18/24 with 100 mL fluid removed   Hypothyroidism Follow TSH Resumed home levothyroxine    Type 2 diabetes with hyperglycemia Hemoglobin A1c 6.6 on 11/23/2023 Presented with serum glucose of 196 Heart healthy carb modified diet Supplemental sliding scale insulin  coverage. CBG (last 3)  Recent Labs    02/21/24 1708 02/21/24 2027 02/22/24 0711  GLUCAP 138* 157* 110*    CKD 3B Renal function appears to be at baseline 1.26 with GFR of 41. Monitor urine output Follow daily BMP    Advanced hepatic fibrosis Chronic hepatic fibrosis With chronic mildly abnormal LFTs, monitor GI recommended to continue lactulose , if patient refuses and recurrent AMS then start Xifaxan  550 mg BID So far, pt has been taking the lactulose    Elevated liver chemistries Alkaline phosphatase vitamin D , AST 48, T. bili 2.3 Avoid hepatotoxic agents Hepatic function panel stable    Hypervolemic hyponatremia Serum sodium 127>>131 Continue diuresing Repeat BMP daily while diuresing   Non anion gap metabolic acidosis Serum bicarb 19, anion gap 15 Continue to monitor  GERD Resume home PPI   Hyperlipidemia Resume home Crestor    Obesity BMI 36 Recommend weight loss outpatient with regular physical activity and healthy dieting   History of DVT in 2017 Was taken off Eliquis  prior to admission due to history of GI bleed Continue subcu insulin  for DVT prophylaxis.   QTc prolongation. QTc 534 on admission 12 EKG Avoid QTc prolonging  agents Optimize magnesium  and potassium levels.   Monitor on telemetry.   MGUS Outpatient follow-up with medical oncology.   Right upper lobe pulmonary nodule measuring 7 mm Outpatient follow-up.  DVT prophylaxis: enoxaparin   Code Status: Full  Family Communication:  Disposition: home    Consultants:  cardio Procedures:   Antimicrobials:    Subjective: A bit of improvement in leg edema but still with significant edema in feet.    Objective: Vitals:   02/21/24 1834 02/21/24 1900 02/21/24 2026 02/22/24 0417  BP: 120/84 118/77 118/77 115/73  Pulse: 95 92 92 88  Resp:   18 20  Temp: 99.6 F (37.6 C) 98.7 F (37.1 C) 98.7 F (37.1 C) 98.6 F (37 C)  TempSrc: Oral Oral Oral Oral  SpO2: 100% 100% 100% 100%  Weight:    105.1 kg  Height:        Intake/Output Summary (Last 24 hours) at 02/22/2024 1042 Last data filed at 02/22/2024 0602 Gross per 24 hour  Intake 720 ml  Output 1350 ml  Net -630 ml   Filed Weights   02/20/24 0511 02/21/24 0457 02/22/24 0417  Weight: 106.1 kg 105.8 kg 105.1 kg   Examination:  General exam: Appears grossly volume overloaded;  calm and uncomfortable  Respiratory system: no increased work of breathing.   Cardiovascular system: normal S1 & S2 heard. mild JVD, No rubs, gallops or clicks. 2+ pedal edema. Gastrointestinal system: Abdomen is mildly distended, soft and nontender. No organomegaly or masses felt. Normal bowel sounds heard. Central nervous system: Alert and oriented. No focal neurological deficits. Extremities: 2+ pitting edema BLEs Symmetric 5 x 5 power. Skin: No rashes, lesions or ulcers. Psychiatry: Judgement and insight appear normal. Mood & affect appropriate.   Data Reviewed: I have personally reviewed following labs and imaging studies  CBC: Recent Labs  Lab 02/16/24 1728 02/18/24 0516  WBC 9.2 8.7  NEUTROABS 7.2  --   HGB 12.4 10.6*  HCT 39.7 33.3*  MCV 80.0 80.2  PLT 317 219    Basic Metabolic Panel: Recent  Labs  Lab 02/18/24 0516 02/19/24 0458 02/20/24 0554 02/21/24 0539 02/22/24 0441  NA 130* 131* 133* 133* 134*  K 4.1 4.0 4.1 4.0 3.9  CL 97* 97* 98 99 99  CO2 28 30 28 29 29   GLUCOSE 137* 153* 120* 89 114*  BUN 29* 27* 27* 27* 26*  CREATININE 1.19* 1.17* 1.23* 1.29* 1.42*  CALCIUM  8.8* 8.8* 8.9 8.7* 8.8*  MG 1.8 1.9  --   --   --     CBG: Recent Labs  Lab 02/21/24 0727 02/21/24 1139 02/21/24 1708 02/21/24 2027 02/22/24 0711  GLUCAP 86 130* 138* 157* 110*    Recent Results (from the past 240 hours)  Body fluid culture w Gram Stain     Status: None (Preliminary result)   Collection Time: 02/18/24 10:32 AM   Specimen: Pleura; Body Fluid  Result Value Ref Range Status   Specimen Description   Final    PLEURAL Performed at [facility], [address], [place], [place] [address]    Special Requests  Final    NONE Performed at [facility], [address], [place], [place] [address]    Gram Stain   Final    WBC PRESENT, PREDOMINANTLY MONONUCLEAR NO ORGANISMS SEEN CYTOSPIN SMEAR    Culture   Final    NO GROWTH < 24 HOURS Performed at [facility], [address], [place], [place] [address]    Report Status PENDING  Incomplete     Radiology Studies: No results found.  Scheduled Meds:  bisoprolol   5 mg Oral Daily   cycloSPORINE   1 drop Both Eyes BID   DULoxetine   60 mg Oral BID   enoxaparin  (LOVENOX ) injection  40 mg Subcutaneous Q24H   furosemide   80 mg Intravenous TID   insulin  aspart  0-5 Units Subcutaneous QHS   insulin  aspart  0-9 Units Subcutaneous TID WC   insulin  glargine  10 Units Subcutaneous Daily   lactulose   20 g Oral BID   levothyroxine   88 mcg Oral QAC breakfast   pantoprazole   40 mg Oral BID   potassium chloride  SA  20 mEq Oral Daily   rosuvastatin   5 mg Oral Daily   spironolactone   25 mg Oral Daily   traZODone   150 mg Oral QPM   ursodiol   300 mg Oral BID   Continuous Infusions:   LOS: 6 days   Time spent:  55 mins  Charnae Lill Vicci, MD How to contact the TRH Attending or Consulting provider 7A - 7P or covering provider during after hours 7P -7A, for this patient?  Check the care team in [facility] and look for a) attending/consulting TRH provider listed and b) the TRH team listed Log into [email or web address] to find provider on call.  Locate the TRH provider you are looking for under [facility] and page to a number that you can be directly reached. If you still have difficulty reaching the provider, please page the [facility] (Director on Call) for the Hospitalists listed on amion for assistance.  02/22/2024, 10:42 AM    "

## 2024-02-22 NOTE — Progress Notes (Signed)
 Mobility Specialist Progress Note:    02/22/24 1320  Mobility  Activity Pivoted/transferred from chair to bed  Level of Assistance Moderate assist, patient does 50-74% (+2)  Assistive Device Front wheel walker  Distance Ambulated (ft) 2 ft  Range of Motion/Exercises Active;All extremities  Activity Response Tolerated well  Mobility Referral Yes  Mobility visit 1 Mobility  Mobility Specialist Start Time (ACUTE ONLY) 1320  Mobility Specialist Stop Time (ACUTE ONLY) 1340  Mobility Specialist Time Calculation (min) (ACUTE ONLY) 20 min   Pt received in chair, requesting assistance to bed. Required ModA +2 to stand and pivot with RW. Tolerated well, BLE swelling. NT in room, all needs met.  Broxton Broady Mobility Specialist Please contact via Special Educational Needs Teacher or  Rehab office at [phone]

## 2024-02-23 ENCOUNTER — Encounter: Payer: Self-pay | Admitting: Oncology

## 2024-02-23 DIAGNOSIS — J9611 Chronic respiratory failure with hypoxia: Secondary | ICD-10-CM

## 2024-02-23 DIAGNOSIS — N1832 Chronic kidney disease, stage 3b: Secondary | ICD-10-CM | POA: Diagnosis not present

## 2024-02-23 DIAGNOSIS — I4891 Unspecified atrial fibrillation: Secondary | ICD-10-CM | POA: Diagnosis not present

## 2024-02-23 DIAGNOSIS — G894 Chronic pain syndrome: Secondary | ICD-10-CM | POA: Diagnosis not present

## 2024-02-23 DIAGNOSIS — I480 Paroxysmal atrial fibrillation: Secondary | ICD-10-CM | POA: Diagnosis not present

## 2024-02-23 DIAGNOSIS — I5023 Acute on chronic systolic (congestive) heart failure: Secondary | ICD-10-CM | POA: Diagnosis not present

## 2024-02-23 DIAGNOSIS — I5020 Unspecified systolic (congestive) heart failure: Secondary | ICD-10-CM

## 2024-02-23 LAB — GLUCOSE, CAPILLARY
Glucose-Capillary: 128 mg/dL — ABNORMAL HIGH (ref 70–99)
Glucose-Capillary: 140 mg/dL — ABNORMAL HIGH (ref 70–99)
Glucose-Capillary: 201 mg/dL — ABNORMAL HIGH (ref 70–99)
Glucose-Capillary: 151 mg/dL — ABNORMAL HIGH (ref 70–99)

## 2024-02-23 LAB — BASIC METABOLIC PANEL WITH GFR
Anion gap: 8 (ref 5–15)
BUN: 25 mg/dL — ABNORMAL HIGH (ref 8–23)
CO2: 27 mmol/L (ref 22–32)
Calcium: 8.8 mg/dL — ABNORMAL LOW (ref 8.9–10.3)
Chloride: 98 mmol/L (ref 98–111)
Creatinine, Ser: 1.27 mg/dL — ABNORMAL HIGH (ref 0.44–1.00)
GFR, Estimated: 44 mL/min — ABNORMAL LOW
Glucose, Bld: 116 mg/dL — ABNORMAL HIGH (ref 70–99)
Potassium: 4.1 mmol/L (ref 3.5–5.1)
Sodium: 133 mmol/L — ABNORMAL LOW (ref 135–145)

## 2024-02-23 LAB — CBC
HCT: 35.2 % — ABNORMAL LOW (ref 36.0–46.0)
Hemoglobin: 10.5 g/dL — ABNORMAL LOW (ref 12.0–15.0)
MCH: 24.7 pg — ABNORMAL LOW (ref 26.0–34.0)
MCHC: 29.8 g/dL — ABNORMAL LOW (ref 30.0–36.0)
MCV: 82.8 fL (ref 80.0–100.0)
Platelets: 169 K/uL (ref 150–400)
RBC: 4.25 MIL/uL (ref 3.87–5.11)
RDW: 15.9 % — ABNORMAL HIGH (ref 11.5–15.5)
WBC: 7.8 K/uL (ref 4.0–10.5)
nRBC: 0 % (ref 0.0–0.2)

## 2024-02-23 LAB — CYTOLOGY - NON PAP

## 2024-02-23 LAB — MAGNESIUM: Magnesium: 2.2 mg/dL (ref 1.7–2.4)

## 2024-02-23 NOTE — Progress Notes (Signed)
 Physical Therapy Treatment Patient Details Name: Stephanie [name] MRN: [id number] DOB: [birth year] Today's Date: 02/23/2024   History of Present Illness Stephanie [name] is a 74 y.o. female with medical history significant for MGUS, chronic HFrEF, history of DVT (was taken off Eliquis  prior to admission due to history of GI bleed), CVA, CKD 3B, COPD/asthma, chronic hypoxia on 3-4 L [place] at baseline, fibromyalgia, GERD, type 2 diabetes, hypertension, hyperlipidemia, hypothyroidism, obesity, who presents to the ER with complaints of shortness of breath x 3 days and worsening.  Associated with generalized swelling.  Endorses compliance with home medications.  She does not weigh herself daily.  EMS was activated.  Upon EMS arrival, the patient was hypoxic with O2 saturation in the 66 to 70% on room air.  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  She was placed on 15 L nonrebreather and brought into the ER for further evaluation.    PT Comments  Patient agreeable for therapy. Patient demonstrates good return for sitting up at bedside with [facility] raised, completing sit to stands with bed elevated and limited to a few steps at bedside mostly due to c/o fatigue and SOB. Patient tolerated sitting up in chair after therapy. Patient will benefit from continued skilled physical therapy in hospital and recommended venue below to increase strength, balance, endurance for safe ADLs and gait.      If plan is discharge home, recommend the following: A little help with walking and/or transfers;A little help with bathing/dressing/bathroom;Help with stairs or ramp for entrance;Assist for transportation;Assistance with cooking/housework   Can travel by private vehicle        Equipment Recommendations  None recommended by PT    Recommendations for Other Services       Precautions / Restrictions Precautions Precautions: Fall Recall of Precautions/Restrictions: Intact Restrictions Weight Bearing Restrictions Per Provider Order: No     Mobility   Bed Mobility Overal bed mobility: Needs Assistance Bed Mobility: Supine to Sit     Supine to sit: Supervision, HOB elevated     General bed mobility comments: good return for sitting up at bedside with [facility] rasied    Transfers Overall transfer level: Needs assistance Equipment used: Rolling walker (2 wheels) Transfers: Sit to/from Stand, Bed to chair/wheelchair/BSC Sit to Stand: Contact guard assist, Min assist, From elevated surface   Step pivot transfers: Min assist, Mod assist       General transfer comment: good return for completing sit to stand with bed elevated and using armrest of chair    Ambulation/Gait Ambulation/Gait assistance: Min assist, Mod assist Gait Distance (Feet): 5 Feet Assistive device: Rolling walker (2 wheels) Gait Pattern/deviations: Decreased step length - right, Decreased step length - left, Decreased stride length, Trunk flexed Gait velocity: slow     General Gait Details: limited to a few side steps at bedside due to weakness, SOB and mild pain in feet   Stairs             Wheelchair Mobility     Tilt Bed    Modified Rankin (Stroke Patients Only)       Balance Overall balance assessment: Needs assistance Sitting-balance support: Feet supported, No upper extremity supported Sitting balance-Leahy Scale: Fair Sitting balance - Comments: fair/good seated at EOB   Standing balance support: Reliant on assistive device for balance, During functional activity, Bilateral upper extremity supported Standing balance-Leahy Scale: Poor Standing balance comment: fair/poor using RW  Communication Communication Communication: No apparent difficulties  Cognition Arousal: Alert Behavior During Therapy: WFL for tasks assessed/performed   PT - Cognitive impairments: No apparent impairments                         Following commands: Intact      Cueing Cueing Techniques: Verbal cues,  Tactile cues  Exercises General Exercises - Lower Extremity Long Arc Quad: Seated, AROM, Strengthening, Both, 10 reps Hip Flexion/Marching: Seated, AROM, Strengthening, Both, 10 reps Toe Raises: Seated, AROM, Strengthening, Both, 10 reps Heel Raises: Seated, AROM, Strengthening, Both, 10 reps    General Comments        Pertinent Vitals/Pain Pain Assessment Pain Assessment: Faces Faces Pain Scale: Hurts a little bit Pain Location: heels of feet Pain Descriptors / Indicators: Discomfort Pain Intervention(s): Limited activity within patient's tolerance, Monitored during session, Repositioned    Home Living                          Prior Function            PT Goals (current goals can now be found in the care plan section) Acute Rehab PT Goals Patient Stated Goal: return home with family to assist PT Goal Formulation: With patient Time For Goal Achievement: 03/01/24 Potential to Achieve Goals: Good Progress towards PT goals: Progressing toward goals    Frequency    Min 3X/week      PT Plan      Co-evaluation              AM-PAC PT 6 Clicks Mobility   Outcome Measure  Help needed turning from your back to your side while in a flat bed without using bedrails?: None Help needed moving from lying on your back to sitting on the side of a flat bed without using bedrails?: A Little Help needed moving to and from a bed to a chair (including a wheelchair)?: A Lot Help needed standing up from a chair using your arms (e.g., wheelchair or bedside chair)?: A Little Help needed to walk in hospital room?: A Lot Help needed climbing 3-5 steps with a railing? : Total 6 Click Score: 15    End of Session Equipment Utilized During Treatment: Oxygen  Activity Tolerance: Patient tolerated treatment well;Patient limited by fatigue Patient left: in chair;with call bell/phone within reach Nurse Communication: Mobility status PT Visit Diagnosis: Unsteadiness on feet  (R26.81);Other abnormalities of gait and mobility (R26.89);Muscle weakness (generalized) (M62.81)     Time: 9152-9082 PT Time Calculation (min) (ACUTE ONLY): 30 min  Charges:    $Therapeutic Exercise: 8-22 mins $Therapeutic Activity: 8-22 mins PT General Charges $$ ACUTE PT VISIT: 1 Visit                     12:40 PM, 02/23/2024 Lynwood Music, MPT Physical Therapist with [facility] 336 [phone] office [phone] mobile phone

## 2024-02-23 NOTE — Progress Notes (Signed)
 Heart Failure Stewardship Pharmacy Note  PCP: [facility] PCP-Cardiologist: Jayson Sierras, MD  HPI: Stephanie [name] is a 74 y.o. female with MGUS, CHF history of DVT (Eliquis  stopped due to history of GI bleed), CVA, CKD 3B, COPD/asthma, chronic hypoxia on 3-4 L [place] at baseline, fibromyalgia, GERD, NASH, type 2 diabetes, hypertension, hyperlipidemia, hypothyroidism, obesity,  who presented with 3 days of progressive shortness of breath. On admission, BNP was 87562, HS-troponin was not measured, and alkPhos was 505. Patient underwent thoracentesis 02/18/24. Chest x-ray noted generalized increase in bilateral perihilar opacities which may represent edema.   Pertinent cardiac history: TTE 10/2023 noted normal LVEF and G1DD. TTE 05/2021 showed LVEF down to 45-50% with G2DD, which subsequently improved to 50-55% in 12/2022. LVEF continued to decline to 40% in 05/2023, then further to 35-40% in 11/2023. RHC performed 11/2023, showing severe biventricular failure (R >L), RA 27, CO/CI 4.0/1.8. TTE this admission noted LVEF 25-30%, severely reduced RV function, small pericardial effusion, mild to moderate MR, moderate to severe TR, and dilated IVC.  Pertinent Lab Values: Creat  Date Value Ref Range Status  02/11/2023 0.95 0.60 - 1.00 mg/dL Final   Creatinine, Ser  Date Value Ref Range Status  02/23/2024 1.27 (H) 0.44 - 1.00 mg/dL Final   BUN  Date Value Ref Range Status  02/23/2024 25 (H) 8 - 23 mg/dL Final   Potassium  Date Value Ref Range Status  02/23/2024 4.1 3.5 - 5.1 mmol/L Final   Sodium  Date Value Ref Range Status  02/23/2024 133 (L) 135 - 145 mmol/L Final   Brain Natriuretic Peptide  Date Value Ref Range Status  02/11/2023 1,213 (H) <100 pg/mL Final    Comment:    . BNP levels increase with age in the general population with the highest values seen in individuals greater than 75 years of age. Reference: J. Am. Penne. Cardiol. 2002; 59:023-017. .    B Natriuretic  Peptide  Date Value Ref Range Status  12/16/2023 1,088.4 (H) 0.0 - 100.0 pg/mL Final    Comment:    Performed at [facility], [address], [place], [place] [address]   Magnesium   Date Value Ref Range Status  02/23/2024 2.2 1.7 - 2.4 mg/dL Final    Comment:    Performed at [facility], [address] [place], [place] [address]   Hemoglobin A1C  Date Value Ref Range Status  09/09/2021 8.8  Final   Hgb A1c MFr Bld  Date Value Ref Range Status  11/23/2023 6.6 (H) 4.8 - 5.6 % Final    Comment:    (NOTE) Diagnosis of Diabetes The following HbA1c ranges recommended by the American Diabetes Association (ADA) may be used as an aid in the diagnosis of diabetes mellitus.  Hemoglobin             Suggested A1C NGSP%              Diagnosis  <5.7                   Non Diabetic  5.7-6.4                Pre-Diabetic  >6.4                   Diabetic  <7.0                   Glycemic control for  adults with diabetes.     TSH  Date Value Ref Range Status  02/16/2024 6.110 (H) 0.350 - 4.500 uIU/mL Final    Comment:    Performed at [facility], [address], [place], [place] [address]  10/21/2023 4.54 (H) 0.40 - 4.50 mIU/L Final   LDH  Date Value Ref Range Status  04/15/2023 207 (H) 98 - 192 U/L Final    Comment:    Performed at [facility], [address] [place], [place] [address]    Vital Signs:  Temp:  [97.5 F (36.4 C)-98.6 F (37 C)] 97.8 F (36.6 C) (01/07 0407) Pulse Rate:  [84-86] 86 (01/07 0407) Cardiac Rhythm: Normal sinus rhythm;Bundle branch block (01/06 1900) Resp:  [16-18] 18 (01/07 0407) BP: (123-138)/(69-85) 123/69 (01/07 0407) SpO2:  [95 %-100 %] 98 % (01/07 0407) Weight:  [103.8 kg (228 lb 13.4 oz)] 103.8 kg (228 lb 13.4 oz) (01/07 0407)  Intake/Output Summary (Last 24 hours) at 02/23/2024 0742 Last data filed at 02/23/2024 0457 Gross per 24 hour  Intake 720 ml  Output 1000 ml  Net -280 ml    Current Heart Failure  Medications:  Loop diuretic: furosemide  80 mg IV TID Beta-Blocker: bisoprolol  5 mg daily ACEI/ARB/ARNI: none MRA: spironolactone  25 mg daily SGLT2i: none Other: none  Prior to admission Heart Failure Medications:  Loop diuretic: torsemide  40 mg daily Beta-Blocker: bisoprolol  5 mg daily ACEI/ARB/ARNI: none MRA: none SGLT2i: none Other: none  Assessment: 1. Acute on chronic systolic heart failure (LVEF 25-30%) with severe RV failure, due to unknown etiology. NYHA class III-IV symptoms.   *Unable to reach patinet via telephone today, so the below assessment is based on chart review and provider assessments. -Volume: Suspect patient remains somewhat volume overloaded. Creatinine is improving with diuresis. Continue furosemide  80 mg IV TID at this time.  -Hemodynamics: BP is normal to slightly elevated and HR 80s. -BB: Continue current dose of bisoprolol  5 mg daily.Would not titrate until euvolemic. CI on recent RHC was low and this may further decompensate. -ACEI/ARB/ARNI: Consider adding losartan  12.5 mg daily tomorrow if creatinine and BP are stable. -MRA: Continue spironolactone  25 mg daily. -SGLT2i: May be able to initiate in the future, but appears ot have been treated for a UTI recently and would defer at this time until able to discuss with the patient further.  Plan: 1) Medication changes recommended at this time: -None today, could consider adding losartan  at low dose tomorrow pending BP and renal function  2) Patient assistance: -Pending  3) Education: - Patient has been educated on current HF medications and potential additions to HF medication regimen - Patient verbalizes understanding that over the next few months, these medication doses may change and more medications may be added to optimize HF regimen - Patient has been educated on basic disease state pathophysiology and goals of therapy  Please do not hesitate to reach out with questions or concerns,  Edrees Valent,  PharmD, CPP, BCPS, [facility] Heart Failure Pharmacist  Phone - [phone] 02/23/2024 12:14 PM

## 2024-02-23 NOTE — Progress Notes (Signed)
 Mobility Specialist Progress Note:    02/23/24 1526  Mobility  Activity Pivoted/transferred to/from [facility]  Level of Assistance Moderate assist, patient does 50-74% (+2)  Assistive Device Front wheel walker  Distance Ambulated (ft) 3 ft  Range of Motion/Exercises Active;All extremities  Activity Response Tolerated well  Mobility Referral Yes  Mobility visit 1 Mobility  Mobility Specialist Start Time (ACUTE ONLY) 1526  Mobility Specialist Stop Time (ACUTE ONLY) 1545  Mobility Specialist Time Calculation (min) (ACUTE ONLY) 19 min   Pt received in chair, NT requesting assistance transferring to [facility]. Required ModA+2 to stand and transfer with RW. Tolerated well, NT in room. Left on BSC, call bell in reach. All needs met.  Stephanie [name] Mobility Specialist Please contact via Special Educational Needs Teacher or  Rehab office at [phone]

## 2024-02-23 NOTE — Plan of Care (Signed)

## 2024-02-23 NOTE — Progress Notes (Signed)
 "   Progress Note  Patient Name: Stephanie [name] Date of Encounter: 02/23/2024  Primary Cardiologist: Jayson Sierras, MD  Interval Summary  Chart reviewed.  Urine output incomplete.  Weight has trended down 2 pounds over the last few days.  She tells me that she does feel better, less short of breath.  Working with mobility specialist this morning.  Vital Signs  Vitals:   02/22/24 0417 02/22/24 1300 02/22/24 2100 02/23/24 0407  BP: 115/73 126/85 138/79 123/69  Pulse: 88 84 85 86  Resp: 20  16 18   Temp: 98.6 F (37 C) 98.6 F (37 C) (!) 97.5 F (36.4 C) 97.8 F (36.6 C)  TempSrc: Oral Oral Oral Oral  SpO2: 100% 100% 95% 98%  Weight: 105.1 kg   103.8 kg  Height:        Intake/Output Summary (Last 24 hours) at 02/23/2024 0854 Last data filed at 02/23/2024 0457 Gross per 24 hour  Intake 480 ml  Output 1000 ml  Net -520 ml   Filed Weights   02/21/24 0457 02/22/24 0417 02/23/24 0407  Weight: 105.8 kg 105.1 kg 103.8 kg    Physical Exam  GEN: No acute distress.   Neck: No JVD. Cardiac: Intermittently irregular, no gallop.  Respiratory: Nonlabored. Clear to auscultation bilaterally. GI: Soft, nontender, bowel sounds present. MS: Improving edema.  ECG/Telemetry  Telemetry reviewed showing sinus rhythm with PACs, intermittent atrial fibrillation.  Labs  Chemistry Recent Labs  Lab 02/16/24 1728 02/18/24 0516 02/19/24 0458 02/21/24 0539 02/22/24 0441 02/23/24 0459  NA 127* 130*   < > 133* 134* 133*  K 4.3 4.1   < > 4.0 3.9 4.1  CL 93* 97*   < > 99 99 98  CO2 19* 28   < > 29 29 27   GLUCOSE 196* 137*   < > 89 114* 116*  BUN 31* 29*   < > 27* 26* 25*  CREATININE 1.36* 1.19*   < > 1.29* 1.42* 1.27*  CALCIUM  9.1 8.8*   < > 8.7* 8.8* 8.8*  PROT 9.2* 7.6  --   --   --   --   ALBUMIN  3.3* 2.9*  --   --   --   --   AST 48* 33  --   --   --   --   ALT 26 20  --   --   --   --   ALKPHOS 580* 505*  --   --   --   --   BILITOT 2.3* 1.9*  --   --   --   --   GFRNONAA 41*  48*   < > 44* 39* 44*  ANIONGAP 15 6   < > 5 6 8    < > = values in this interval not displayed.    Hematology Recent Labs  Lab 02/16/24 1728 02/18/24 0516 02/23/24 0459  WBC 9.2 8.7 7.8  RBC 4.96 4.15 4.25  HGB 12.4 10.6* 10.5*  HCT 39.7 33.3* 35.2*  MCV 80.0 80.2 82.8  MCH 25.0* 25.5* 24.7*  MCHC 31.2 31.8 29.8*  RDW 15.7* 15.7* 15.9*  PLT 317 219 169    Lipid Panel     Component Value Date/Time   CHOL 122 12/21/2023 1541   CHOL 186 03/16/2023 1015   TRIG 75 12/21/2023 1541   HDL 56 12/21/2023 1541   HDL 60 03/16/2023 1015   CHOLHDL 2.2 12/21/2023 1541   VLDL 15 12/21/2023 1541   LDLCALC 51 12/21/2023 1541  LDLCALC 109 (H) 03/16/2023 1015   LABVLDL 17 03/16/2023 1015    Cardiac Studies  Echocardiogram 02/17/2024:  1. No LV thrombus by Definity . Left ventricular ejection fraction, by  estimation, is 25 to 30%. The left ventricle has severely decreased  function. The left ventricle demonstrates regional wall motion  abnormalities (see scoring diagram/findings for  description). Left ventricular diastolic function could not be evaluated.  There is the interventricular septum is flattened in systole and diastole,  consistent with right ventricular pressure and volume overload.   2. Right ventricular systolic function is severely reduced. The right  ventricular size is moderately enlarged. There is moderately elevated  pulmonary artery systolic pressure. The estimated right ventricular  systolic pressure is 46.4 mmHg.   3. Left atrial size was mildly dilated.   4. Right atrial size was mildly dilated.   5. A small pericardial effusion is present. The pericardial effusion is  circumferential. Large pleural effusion in the left lateral region.   6. The mitral valve is normal in structure. Mild to moderate mitral valve  regurgitation. No evidence of mitral stenosis.   7. The tricuspid valve is abnormal. Tricuspid valve regurgitation is  moderate to severe.   8. The  aortic valve has an indeterminant number of cusps. Aortic valve  regurgitation is not visualized. No aortic stenosis is present. Aortic  valve mean gradient measures 3.0 mmHg.   9. The inferior vena cava is dilated in size with <50% respiratory  variability, suggesting right atrial pressure of 15 mmHg.   Assessment & Plan  1.  HFrEF with biventricular heart failure, LVEF 25 to 30% and with severe RV dysfunction.  Not candidate for advanced heart failure strategies per workup within the last 6 months (reviewed [facility] and heart failure clinic data).  Managing medically.  Palliative care plan.  Symptomatically improving with IV diuresis, GDMT otherwise limited.  Did undergo therapeutic left-sided thoracentesis on January 2 as well.  2.  CKD stage IIIb, creatinine 1.27 with GFR 44.  3.  Primary hypertension.  4.  History of GI bleed in the setting of use of Eliquis  for treatment of DVT, anticoagulation discontinued in November 2025 and shared decision made to hold off on any further efforts at anticoagulation.  5.  Paroxysmal atrial fibrillation.  CHA2DS2-VASc score is 6.  Poor candidate for anticoagulation as discussed above and not a good candidate for Watchman device.  Managing with heart rate control strategy.  Currently on bisoprolol  5 mg daily largely for heart rate control with paroxysmal atrial fibrillation.  Continue Lasix  80 mg IV 3 times daily, KCl 20 mEq daily, Aldactone  25 mg daily, and Crestor  5 mg daily.  For questions or updates, please contact [facility] Please consult [email or web address] for contact info under   Signed, Jayson Sierras, MD  02/23/2024, 8:54 AM    "

## 2024-02-23 NOTE — Progress Notes (Signed)
 "          PROGRESS NOTE  Stephanie [name] FMW:[id number] DOB: 20-Jul-1950 DOA: 02/16/2024 PCP: [facility]  Brief History:  74 y.o. female with medical history significant for MGUS, chronic HFrEF, history of DVT (was taken off Eliquis  prior to admission due to history of GI bleed), CVA, CKD 3B, COPD/asthma, chronic hypoxia on 3-4 L [place] at baseline, fibromyalgia, GERD, type 2 diabetes, hypertension, hyperlipidemia, hypothyroidism, obesity, who presents to the ER with complaints of shortness of breath x 3 days and worsening.  Associated with generalized swelling.  Endorses compliance with home medications.  She does not weigh herself daily.  EMS was activated.  Upon EMS arrival, the patient was hypoxic with O2 saturation in the 66 to 70% on room air.  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  She was placed on 15 L nonrebreather and brought into the ER for further evaluation.   In the ER, pt was notably tachycardic and tachypneic.  Chest x-ray showed left lung base airspace opacity and moderate left pleural effusion.  Mild pulmonary edema.  Stable cardiomegaly.  Right upper lobe pulmonary nodule measuring 7 mm.  Lab studies notable for proBNP greater than 15,000.  Twelve-lead EKG, atrial fibrillation with a rate of 108, QTc 534.   The patient received IV Lasix  80 mg x 1.  TRH, hospitalist service, was asked to admit for further management of acute on chronic HFrEF.   ED Course: Temperature 97.6.  BP 151/97, pulse 103, respiratory rate 23.  O2 saturation 67% on room air.   Assessment/Plan:  Acute on chronic HFrEF -Presented with cardiomegaly, pulmonary edema, left pleural effusion, severely marked volume overload. Last 2D echo done on 11/17/2023 revealed LVEF 35 to 40% -IV furosemide  40 mg BID, increased by cardiology to 80 mg IV TID on 02/18/24>>continue -strict I's and O's and daily weight -02/17/24 Echo--EF 25-30%, + WMA, severe decrease RVF, RVSP 46.4, small pericardial eff -appreciate cardiology -continue  bisoprolol , spironolactone   Moderate left pleural effusion -thoracentesis on 02/18/24 with 100 mL fluid removed  -fluid appears transudative  PAF -rate controlled -continue bisoprolol  -CHA2DS2-VASc score is 6.  Poor candidate for anticoagulation due to GIB and not a good candidate for Watchman device  - anticoagulation discontinued in November 2025 and shared decision made to hold off on any further efforts at anticoagulation    Hypothyroidism -02/16/24 TSH--6.110 -Resumed home levothyroxine    Type 2 diabetes with hyperglycemia -Hemoglobin A1c 6.6 on 11/23/2023 -continue lantus  10 units daily -continue novolog  sliding scale - CBGs controlled  CKD 3B -baseline creatinine 1.2-1.5 -monitor with diuresis   Advanced hepatic fibrosis Chronic hepatic fibrosis With chronic mildly abnormal LFTs, monitor GI recommended to continue lactulose , if patient refuses and recurrent AMS then start Xifaxan  550 mg BID So far, pt has been taking the lactulose     Elevated liver chemistries Alkaline phosphatase vitamin D , AST 48, T. bili 2.3 Avoid hepatotoxic agents Hepatic function panel stable    Hypervolemic hyponatremia Serum sodium 127>>131 Continue diuresing>>improved   Non anion gap metabolic acidosis (NAGMA) Serum bicarb 19, anion gap 15 -improved with diuresis   GERD Resume home PPI   Hyperlipidemia Resume home Crestor    Obesity, class 2 BMI 36 initially Recommend weight loss outpatient with regular physical activity and healthy dieting   History of DVT in 2017 Was taken off Eliquis  prior to admission due to history of GI bleed Continue subcu insulin  for DVT prophylaxis.   QTc prolongation. QTc 534 on admission 12 EKG Avoid QTc prolonging  agents Optimize magnesium  and potassium levels.   Monitor on telemetry.   MGUS Outpatient follow-up with medical oncology.   Right upper lobe pulmonary nodule measuring 7 mm Outpatient follow-up.         Family  Communication: no  Family at bedside  Consultants:  cardiology  Code Status:  FULL   DVT Prophylaxis:   [place] Lovenox    Procedures: As Listed in Progress Note Above  Antibiotics: None        Subjective: Patient denies fevers, chills, headache, chest pain, dyspnea, nausea, vomiting, diarrhea, abdominal pain, dysuria, hematuria, hematochezia, and melena.  Objective: Vitals:   02/22/24 1300 02/22/24 2100 02/23/24 0407 02/23/24 1300  BP: 126/85 138/79 123/69 112/78  Pulse: 84 85 86 96  Resp:  16 18   Temp: 98.6 F (37 C) (!) 97.5 F (36.4 C) 97.8 F (36.6 C) 98.6 F (37 C)  TempSrc: Oral Oral Oral Oral  SpO2: 100% 95% 98% 95%  Weight:   103.8 kg   Height:        Intake/Output Summary (Last 24 hours) at 02/23/2024 1817 Last data filed at 02/23/2024 1300 Gross per 24 hour  Intake 300 ml  Output 2400 ml  Net -2100 ml   Weight change: -1.3 kg Exam:  General:  Pt is alert, follows commands appropriately, not in acute distress HEENT: No icterus, No thrush, No neck mass, [place]/AT Cardiovascular: IRRR, S1/S2, no rubs, no gallops Respiratory: bibasilar crackles.  No wheeze Abdomen: Soft/+BS, non tender, non distended, no guarding Extremities: 1 + LE edema, No lymphangitis, No petechiae, No rashes, no synovitis   Data Reviewed: I have personally reviewed following labs and imaging studies Basic Metabolic Panel: Recent Labs  Lab 02/18/24 0516 02/19/24 0458 02/20/24 0554 02/21/24 0539 02/22/24 0441 02/23/24 0459  NA 130* 131* 133* 133* 134* 133*  K 4.1 4.0 4.1 4.0 3.9 4.1  CL 97* 97* 98 99 99 98  CO2 28 30 28 29 29 27   GLUCOSE 137* 153* 120* 89 114* 116*  BUN 29* 27* 27* 27* 26* 25*  CREATININE 1.19* 1.17* 1.23* 1.29* 1.42* 1.27*  CALCIUM  8.8* 8.8* 8.9 8.7* 8.8* 8.8*  MG 1.8 1.9  --   --   --  2.2   Liver Function Tests: Recent Labs  Lab 02/18/24 0516  AST 33  ALT 20  ALKPHOS 505*  BILITOT 1.9*  PROT 7.6  ALBUMIN  2.9*   No results for input(s):  LIPASE, AMYLASE in the last 168 hours. No results for input(s): AMMONIA in the last 168 hours. Coagulation Profile: No results for input(s): INR, PROTIME in the last 168 hours. CBC: Recent Labs  Lab 02/18/24 0516 02/23/24 0459  WBC 8.7 7.8  HGB 10.6* 10.5*  HCT 33.3* 35.2*  MCV 80.2 82.8  PLT 219 169   Cardiac Enzymes: No results for input(s): CKTOTAL, CKMB, CKMBINDEX, TROPONINI in the last 168 hours. BNP: Invalid input(s): POCBNP CBG: Recent Labs  Lab 02/22/24 1114 02/22/24 2047 02/23/24 0721 02/23/24 1126 02/23/24 1604  GLUCAP 177* 176* 128* 140* 201*   HbA1C: No results for input(s): HGBA1C in the last 72 hours. Urine analysis:    Component Value Date/Time   COLORURINE YELLOW 12/21/2023 1512   APPEARANCEUR CLEAR 12/21/2023 1512   LABSPEC 1.012 12/21/2023 1512   PHURINE 5.0 12/21/2023 1512   GLUCOSEU NEGATIVE 12/21/2023 1512   HGBUR NEGATIVE 12/21/2023 1512   BILIRUBINUR NEGATIVE 12/21/2023 1512   KETONESUR NEGATIVE 12/21/2023 1512   PROTEINUR NEGATIVE 12/21/2023 1512   UROBILINOGEN 0.2  02/10/2014 1255   NITRITE NEGATIVE 12/21/2023 1512   LEUKOCYTESUR NEGATIVE 12/21/2023 1512   Sepsis Labs: @LABRCNTIP (procalcitonin:4,lacticidven:4) ) Recent Results (from the past 240 hours)  Body fluid culture w Gram Stain     Status: None (Preliminary result)   Collection Time: 02/18/24 10:32 AM   Specimen: Pleura; Body Fluid  Result Value Ref Range Status   Specimen Description   Final    PLEURAL Performed at [facility], [address] [place], [place] [address]    Special Requests   Final    NONE Performed at [facility], [address], [place], [place] [address]    Gram Stain   Final    WBC PRESENT, PREDOMINANTLY MONONUCLEAR NO ORGANISMS SEEN CYTOSPIN SMEAR    Culture   Final    NO GROWTH 2 DAYS Performed at [facility], [address], [place], [place] [address]    Report Status PENDING  Incomplete      Scheduled Meds:  bisoprolol   5 mg Oral Daily   cycloSPORINE   1 drop Both Eyes BID   DULoxetine   60 mg Oral BID   enoxaparin  (LOVENOX ) injection  40 mg Subcutaneous Q24H   furosemide   80 mg Intravenous TID   insulin  aspart  0-5 Units Subcutaneous QHS   insulin  aspart  0-9 Units Subcutaneous TID WC   insulin  glargine  10 Units Subcutaneous Daily   lactulose   20 g Oral BID   levothyroxine   88 mcg Oral QAC breakfast   pantoprazole   40 mg Oral BID   potassium chloride  SA  20 mEq Oral Daily   rosuvastatin   5 mg Oral Daily   spironolactone   25 mg Oral Daily   traZODone   150 mg Oral QPM   ursodiol   300 mg Oral BID   Continuous Infusions:  Procedures/Studies: DG CHEST PORT 1 VIEW Result Date: 02/20/2024 EXAM: 1 VIEW(S) XRAY OF THE CHEST 02/20/2024 06:21:04 AM COMPARISON: 02/18/2024 CLINICAL HISTORY: Acute heart failure (HCC); Pulmonary edema FINDINGS: LUNGS AND PLEURA: Decreased pulmonary edema. Hazy opacity overlying left lung persistent with silhouetting off of the left apex of the heart and left hemidiaphragm. Left carotid apex not visualized due to overlying lung disease. No pneumothorax. HEART AND MEDIASTINUM: Stable cardiomegaly. Unchanged cardiomediastinal silhouette. 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 IMPRESSION: 1. Decreased pulmonary edema. 2. Persistent opacity of the left lower lung zone. The finding may represent a combination of pleural effusion, atelectasis, and lung disease. Electronically signed by: Morgane Naveau MD 02/20/2024 08:39 AM EST RP Workstation: [id number]   ECHOCARDIOGRAM COMPLETE Result Date: 02/18/2024    ECHOCARDIOGRAM REPORT   Patient Name:   MARRIANA HIBBERD Garro Date of Exam: 02/17/2024 Medical Rec #:  [id number]   Height:       68.0 in Accession #:    [id number]  Weight:       238.1 lb Date of Birth:  Jul 16, 1950   BSA:          2.201 m Patient Age:    73 years    BP:           160/93 mmHg Patient Gender: F           HR:           100  bpm. Exam Location:  [facility] Procedure: 2D Echo, Cardiac Doppler, Color Doppler and Intracardiac            Opacification Agent (Both Spectral and Color Flow Doppler were  utilized during procedure). Indications:    CHF- Acute Systolic l50.21  History:        Patient has prior history of Echocardiogram examinations, most                 recent 11/17/2023. CHF, TIA and Stroke, Arrythmias:Atrial                 Fibrillation; Risk Factors:Hypertension, Diabetes, Dyslipidemia                 and Former Smoker.  Sonographer:    Aida Pizza RCS Referring Phys: [id number] CAROLE N HALL IMPRESSIONS  1. No LV thrombus by Definity . Left ventricular ejection fraction, by estimation, is 25 to 30%. The left ventricle has severely decreased function. The left ventricle demonstrates regional wall motion abnormalities (see scoring diagram/findings for description). Left ventricular diastolic function could not be evaluated. There is the interventricular septum is flattened in systole and diastole, consistent with right ventricular pressure and volume overload.  2. Right ventricular systolic function is severely reduced. The right ventricular size is moderately enlarged. There is moderately elevated pulmonary artery systolic pressure. The estimated right ventricular systolic pressure is 46.4 mmHg.  3. Left atrial size was mildly dilated.  4. Right atrial size was mildly dilated.  5. A small pericardial effusion is present. The pericardial effusion is circumferential. Large pleural effusion in the left lateral region.  6. The mitral valve is normal in structure. Mild to moderate mitral valve regurgitation. No evidence of mitral stenosis.  7. The tricuspid valve is abnormal. Tricuspid valve regurgitation is moderate to severe.  8. The aortic valve has an indeterminant number of cusps. Aortic valve regurgitation is not visualized. No aortic stenosis is present. Aortic valve mean gradient measures 3.0 mmHg.  9. The inferior  vena cava is dilated in size with <50% respiratory variability, suggesting right atrial pressure of 15 mmHg. FINDINGS  Left Ventricle: No LV thrombus by Definity . Left ventricular ejection fraction, by estimation, is 25 to 30%. The left ventricle has severely decreased function. The left ventricle demonstrates regional wall motion abnormalities. Definity  contrast agent was given IV to delineate the left ventricular endocardial borders. Strain was performed and the global longitudinal strain is indeterminate. The left ventricular internal cavity size was normal in size. There is no left ventricular hypertrophy. The interventricular septum is flattened in systole and diastole, consistent with right ventricular pressure and volume overload. Left ventricular diastolic function could not be evaluated due to atrial fibrillation. Left ventricular diastolic function could  not be evaluated.  LV Wall Scoring: The entire lateral wall and anterior septum are akinetic. The inferior septum and apex are hypokinetic. Right Ventricle: The right ventricular size is moderately enlarged. No increase in right ventricular wall thickness. Right ventricular systolic function is severely reduced. There is moderately elevated pulmonary artery systolic pressure. The tricuspid regurgitant velocity is 2.80 m/s, and with an assumed right atrial pressure of 15 mmHg, the estimated right ventricular systolic pressure is 46.4 mmHg. Left Atrium: Left atrial size was mildly dilated. Right Atrium: Right atrial size was mildly dilated. Pericardium: A small pericardial effusion is present. The pericardial effusion is circumferential. Mitral Valve: The mitral valve is normal in structure. Mild to moderate mitral valve regurgitation. No evidence of mitral valve stenosis. Tricuspid Valve: The tricuspid valve is abnormal. Tricuspid valve regurgitation is moderate to severe. No evidence of tricuspid stenosis. Aortic Valve: The aortic valve has an  indeterminant number of cusps. Aortic valve regurgitation is not visualized. No  aortic stenosis is present. Aortic valve mean gradient measures 3.0 mmHg. Aortic valve peak gradient measures 4.8 mmHg. Aortic valve area, by VTI measures 2.45 cm. Pulmonic Valve: The pulmonic valve was not well visualized. Pulmonic valve regurgitation is trivial. No evidence of pulmonic stenosis. Aorta: The aortic root is normal in size and structure. Venous: The inferior vena cava is dilated in size with less than 50% respiratory variability, suggesting right atrial pressure of 15 mmHg. IAS/Shunts: No atrial level shunt detected by color flow Doppler. Additional Comments: 3D was performed not requiring image post processing on an independent workstation and was indeterminate. There is a large pleural effusion in the left lateral region.  LEFT VENTRICLE PLAX 2D LVIDd:         4.70 cm      Diastology LVIDs:         3.90 cm      LV e' medial:    5.22 cm/s LV PW:         1.00 cm      LV E/e' medial:  23.3 LV IVS:        1.00 cm      LV e' lateral:   8.95 cm/s LVOT diam:     1.90 cm      LV E/e' lateral: 13.6 LV SV:         50 LV SV Index:   23 LVOT Area:     2.84 cm  LV Volumes (MOD) LV vol d, MOD A2C: 113.0 ml LV vol d, MOD A4C: 110.0 ml LV vol s, MOD A2C: 69.0 ml LV vol s, MOD A4C: 77.3 ml LV SV MOD A2C:     44.0 ml LV SV MOD A4C:     110.0 ml LV SV MOD BP:      36.9 ml RIGHT VENTRICLE RV S prime:     9.70 cm/s TAPSE (M-mode): 2.0 cm LEFT ATRIUM             Index        RIGHT ATRIUM           Index LA diam:        4.10 cm 1.86 cm/m   RA Area:     22.40 cm LA Vol (A2C):   62.4 ml 28.35 ml/m  RA Volume:   69.80 ml  31.71 ml/m LA Vol (A4C):   75.2 ml 34.17 ml/m LA Biplane Vol: 74.5 ml 33.85 ml/m  AORTIC VALVE AV Area (Vmax):    2.40 cm AV Area (Vmean):   2.19 cm AV Area (VTI):     2.45 cm AV Vmax:           109.00 cm/s AV Vmean:          77.100 cm/s AV VTI:            0.206 m AV Peak Grad:      4.8 mmHg AV Mean Grad:      3.0  mmHg LVOT Vmax:         92.33 cm/s LVOT Vmean:        59.533 cm/s LVOT VTI:          0.178 m LVOT/AV VTI ratio: 0.86  AORTA Ao Root diam: 3.20 cm MITRAL VALVE                TRICUSPID VALVE MV Area (PHT): 5.43 cm     TR Peak grad:   31.4 mmHg MV Decel Time: 140 msec  TR Vmax:        280.00 cm/s MR Peak grad: 64.6 mmHg MR Mean grad: 45.0 mmHg     SHUNTS MR Vmax:      402.00 cm/s   Systemic VTI:  0.18 m MR Vmean:     320.0 cm/s    Systemic Diam: 1.90 cm MV E velocity: 121.67 cm/s MV A velocity: 98.00 cm/s MV E/A ratio:  1.24 Vishnu Priya Mallipeddi Electronically signed by Diannah Late Mallipeddi Signature Date/Time: 02/18/2024/3:47:41 PM    Final    DG Chest 1 View Result Date: 02/18/2024 CLINICAL DATA:  Post thoracentesis. EXAM: CHEST  1 VIEW COMPARISON:  02/16/2024 FINDINGS: Diminished left pleural effusion post thoracentesis. No visible pneumothorax. Persistent opacity at the left lung base. Stable cardiomegaly. Generalized increase in bilateral perihilar opacities which may represent edema. IMPRESSION: 1. Diminished left pleural effusion post thoracentesis. No visible pneumothorax. 2. Persistent opacity at the left lung base. 3. Generalized increase in bilateral perihilar opacities which may represent edema. Electronically Signed   By: Andrea Gasman M.D.   On: 02/18/2024 11:52   US  THORACENTESIS ASP PLEURAL SPACE W/IMG GUIDE Result Date: 02/18/2024 INDICATION: Patient with history of congestive heart failure, found to have new left pleural effusion. Request for diagnostic and therapeutic thoracentesis. EXAM: ULTRASOUND GUIDED LEFT THORACENTESIS MEDICATIONS: 8 mL 1% lidocaine  COMPLICATIONS: None immediate. PROCEDURE: An ultrasound guided thoracentesis was thoroughly discussed with the patient and questions answered. The benefits, risks, alternatives and complications were also discussed. The patient understands and wishes to proceed with the procedure. Written consent was obtained. Ultrasound was  performed to localize and mark an adequate pocket of fluid in the left chest. The area was then prepped and draped in the normal sterile fashion. 1% Lidocaine  was used for local anesthesia. Under ultrasound guidance a 6 Fr Safe-T-Centesis catheter was introduced. Thoracentesis was performed. The catheter was removed and a dressing applied. FINDINGS: A total of approximately 100 mL of fluid was removed. Samples were sent to the laboratory as requested by the clinical team. IMPRESSION: Successful ultrasound guided left thoracentesis yielding 100 mL of pleural fluid. Performed by Clotilda Hesselbach, PA-C Electronically Signed   By: Ester Sides M.D.   On: 02/18/2024 10:58   DG Chest Port 1 View Result Date: 02/16/2024 EXAM: 1 VIEW(S) XRAY OF THE CHEST 02/16/2024 06:03:00 PM COMPARISON: 12/21/2023 CLINICAL HISTORY: sob FINDINGS: LUNGS AND PLEURA: Left lung base airspace opacity. Moderate left pleural effusion. Mild pulmonary edema. There is a right upper lobe pulmonary nodule measuring 7 mm. No pneumothorax. HEART AND MEDIASTINUM: Cardiomegaly, stable. BONES AND SOFT TISSUES: Right shoulder arthroplasty noted. IMPRESSION: 1. Left lung base airspace opacity and moderate left pleural effusion. 2. Mild pulmonary edema. 3. Stable cardiomegaly. 4. Right upper lobe pulmonary nodule measuring 7 mm. This can be further evaluated with non-emergent CT. Electronically signed by: Greig Pique MD 02/16/2024 07:17 PM EST RP Workstation: [id number]    Alm Schneider, DO  [facility]  If 7PM-7AM, please contact night-coverage [email or web address] Password [facility] 02/23/2024, 6:17 PM   LOS: 7 days   "

## 2024-02-24 ENCOUNTER — Telehealth (HOSPITAL_COMMUNITY): Payer: Self-pay

## 2024-02-24 ENCOUNTER — Other Ambulatory Visit (HOSPITAL_COMMUNITY): Payer: Self-pay

## 2024-02-24 DIAGNOSIS — N1832 Chronic kidney disease, stage 3b: Secondary | ICD-10-CM | POA: Diagnosis not present

## 2024-02-24 DIAGNOSIS — G894 Chronic pain syndrome: Secondary | ICD-10-CM | POA: Diagnosis not present

## 2024-02-24 DIAGNOSIS — J9611 Chronic respiratory failure with hypoxia: Secondary | ICD-10-CM | POA: Diagnosis not present

## 2024-02-24 DIAGNOSIS — I5023 Acute on chronic systolic (congestive) heart failure: Secondary | ICD-10-CM | POA: Diagnosis not present

## 2024-02-24 LAB — GLUCOSE, CAPILLARY
Glucose-Capillary: 124 mg/dL — ABNORMAL HIGH (ref 70–99)
Glucose-Capillary: 164 mg/dL — ABNORMAL HIGH (ref 70–99)
Glucose-Capillary: 127 mg/dL — ABNORMAL HIGH (ref 70–99)
Glucose-Capillary: 200 mg/dL — ABNORMAL HIGH (ref 70–99)

## 2024-02-24 LAB — BASIC METABOLIC PANEL WITH GFR
Anion gap: 11 (ref 5–15)
BUN: 25 mg/dL — ABNORMAL HIGH (ref 8–23)
CO2: 23 mmol/L (ref 22–32)
Calcium: 8.8 mg/dL — ABNORMAL LOW (ref 8.9–10.3)
Chloride: 97 mmol/L — ABNORMAL LOW (ref 98–111)
Creatinine, Ser: 1.36 mg/dL — ABNORMAL HIGH (ref 0.44–1.00)
GFR, Estimated: 41 mL/min — ABNORMAL LOW
Glucose, Bld: 105 mg/dL — ABNORMAL HIGH (ref 70–99)
Potassium: 4.7 mmol/L (ref 3.5–5.1)
Sodium: 131 mmol/L — ABNORMAL LOW (ref 135–145)

## 2024-02-24 LAB — MAGNESIUM: Magnesium: 2.4 mg/dL (ref 1.7–2.4)

## 2024-02-24 LAB — BODY FLUID CULTURE W GRAM STAIN: Culture: NO GROWTH

## 2024-02-24 NOTE — Plan of Care (Signed)

## 2024-02-24 NOTE — Progress Notes (Signed)
 Mobility Specialist Progress Note:    02/24/24 1455  Mobility  Activity Pivoted/transferred from chair to bed  Level of Assistance Minimal assist, patient does 75% or more (+2)  Assistive Device Front wheel walker  Distance Ambulated (ft) 3 ft  Range of Motion/Exercises Active;All extremities  Activity Response Tolerated well  Mobility Referral Yes  Mobility visit 1 Mobility  Mobility Specialist Start Time (ACUTE ONLY) 1455  Mobility Specialist Stop Time (ACUTE ONLY) 1513  Mobility Specialist Time Calculation (min) (ACUTE ONLY) 18 min   Pt received in chair, requesting assistance to bed. Required MinA+2 to stand and transfer with RW. Tolerated well, asx throughout. NT in room, alarm on. All needs met.  Shaketa Serafin Mobility Specialist Please contact via Special Educational Needs Teacher or  Rehab office at [phone]

## 2024-02-24 NOTE — Progress Notes (Signed)
 "   Progress Note  Patient Name: Stephanie [name] Date of Encounter: 02/24/2024  Primary Cardiologist: Jayson Sierras, MD  Interval Summary  Chart reviewed.  Net urine output approximately 2100 cc last 24 hours.  Eating breakfast.  Breathing comfortably and no chest pain.  Vital Signs  Vitals:   02/23/24 0407 02/23/24 1300 02/23/24 1936 02/24/24 0525  BP: 123/69 112/78 (!) 125/93 130/76  Pulse: 86 96 86 84  Resp: 18  16   Temp: 97.8 F (36.6 C) 98.6 F (37 C) 98 F (36.7 C) 98.1 F (36.7 C)  TempSrc: Oral Oral Oral Oral  SpO2: 98% 95% 100% 100%  Weight: 103.8 kg   103.5 kg  Height:        Intake/Output Summary (Last 24 hours) at 02/24/2024 0842 Last data filed at 02/24/2024 0528 Gross per 24 hour  Intake 540 ml  Output 2650 ml  Net -2110 ml   Filed Weights   02/22/24 0417 02/23/24 0407 02/24/24 0525  Weight: 105.1 kg 103.8 kg 103.5 kg    Physical Exam  GEN: No acute distress.   Neck: No JVD. Cardiac: Intermittently irregular, no gallop.  Respiratory: Nonlabored. Clear to auscultation bilaterally. GI: Soft, nontender, bowel sounds present. MS: Improving edema.  ECG/Telemetry  Telemetry reviewed showing sinus rhythm with PACs.  Labs  Chemistry Recent Labs  Lab 02/18/24 [phone] 02/19/24 0458 02/22/24 0441 02/23/24 0459 02/24/24 0503  NA 130*   < > 134* 133* 131*  K 4.1   < > 3.9 4.1 4.7  CL 97*   < > 99 98 97*  CO2 28   < > 29 27 23   GLUCOSE 137*   < > 114* 116* 105*  BUN 29*   < > 26* 25* 25*  CREATININE 1.19*   < > 1.42* 1.27* 1.36*  CALCIUM  8.8*   < > 8.8* 8.8* 8.8*  PROT 7.6  --   --   --   --   ALBUMIN  2.9*  --   --   --   --   AST 33  --   --   --   --   ALT 20  --   --   --   --   ALKPHOS 505*  --   --   --   --   BILITOT 1.9*  --   --   --   --   GFRNONAA 48*   < > 39* 44* 41*  ANIONGAP 6   < > 6 8 11    < > = values in this interval not displayed.    Hematology Recent Labs  Lab 02/18/24 0516 02/23/24 0459  WBC 8.7 7.8  RBC 4.15 4.25   HGB 10.6* 10.5*  HCT 33.3* 35.2*  MCV 80.2 82.8  MCH 25.5* 24.7*  MCHC 31.8 29.8*  RDW 15.7* 15.9*  PLT 219 169    Lipid Panel     Component Value Date/Time   CHOL 122 12/21/2023 1541   CHOL 186 03/16/2023 1015   TRIG 75 12/21/2023 1541   HDL 56 12/21/2023 1541   HDL 60 03/16/2023 1015   CHOLHDL 2.2 12/21/2023 1541   VLDL 15 12/21/2023 1541   LDLCALC 51 12/21/2023 1541   LDLCALC 109 (H) 03/16/2023 1015   LABVLDL 17 03/16/2023 1015    Cardiac Studies  Echocardiogram 02/17/2024:  1. No LV thrombus by Definity . Left ventricular ejection fraction, by  estimation, is 25 to 30%. The left ventricle has severely decreased  function. The  left ventricle demonstrates regional wall motion  abnormalities (see scoring diagram/findings for  description). Left ventricular diastolic function could not be evaluated.  There is the interventricular septum is flattened in systole and diastole,  consistent with right ventricular pressure and volume overload.   2. Right ventricular systolic function is severely reduced. The right  ventricular size is moderately enlarged. There is moderately elevated  pulmonary artery systolic pressure. The estimated right ventricular  systolic pressure is 46.4 mmHg.   3. Left atrial size was mildly dilated.   4. Right atrial size was mildly dilated.   5. A small pericardial effusion is present. The pericardial effusion is  circumferential. Large pleural effusion in the left lateral region.   6. The mitral valve is normal in structure. Mild to moderate mitral valve  regurgitation. No evidence of mitral stenosis.   7. The tricuspid valve is abnormal. Tricuspid valve regurgitation is  moderate to severe.   8. The aortic valve has an indeterminant number of cusps. Aortic valve  regurgitation is not visualized. No aortic stenosis is present. Aortic  valve mean gradient measures 3.0 mmHg.   9. The inferior vena cava is dilated in size with <50% respiratory   variability, suggesting right atrial pressure of 15 mmHg.   Assessment & Plan  1.  HFrEF with biventricular heart failure, LVEF 25 to 30% and with severe RV dysfunction.  Not candidate for advanced heart failure strategies per workup within the last 6 months (reviewed [facility] and heart failure clinic data).  Managing medically although GDMT limited.  Did undergo therapeutic left-sided thoracentesis on January 2 as well.  She has had symptomatic improvement with IV diuresis.  2.  CKD stage IIIb, creatinine creatinine 1.36 with GFR 41.  3.  Primary hypertension.  Recent systolics 120s to 130s.  4.  History of GI bleed in the setting of use of Eliquis  for treatment of DVT, anticoagulation discontinued in November 2025 and shared decision made to hold off on any further efforts at anticoagulation.  5.  Paroxysmal atrial fibrillation.  CHA2DS2-VASc score is 6.  Poor candidate for anticoagulation as discussed above and not a good candidate for Watchman device.  Managing with heart rate control strategy.  Discussed with patient.  She feels better, likely nearing discharge.  She tells me that she lives in her own home and has her daughter to assist her (does not sound like palliative care is formally involved).  Continue IV diuretics today and plan to convert to oral regimen tomorrow.  Otherwise on bisoprolol  5 mg daily, KCl 20 mEq daily, Aldactone  25 mg daily, and Crestor  5 mg daily.  Check BMET in AM.  For questions or updates, please contact [facility] Please consult [email or web address] for contact info under   Signed, Jayson Sierras, MD  02/24/2024, 8:42 AM    "

## 2024-02-24 NOTE — Progress Notes (Signed)
 "          PROGRESS NOTE  Stephanie [name] FMW:[id number] DOB: 1950/06/16 DOA: 02/16/2024 PCP: [facility]  Brief History:  74 y.o. female with medical history significant for MGUS, chronic HFrEF, history of DVT (was taken off Eliquis  prior to admission due to history of GI bleed), CVA, CKD 3B, COPD/asthma, chronic hypoxia on 3-4 L [place] at baseline, fibromyalgia, GERD, type 2 diabetes, hypertension, hyperlipidemia, hypothyroidism, obesity, who presents to the ER with complaints of shortness of breath x 3 days and worsening.  Associated with generalized swelling.  Endorses compliance with home medications.  She does not weigh herself daily.  EMS was activated.  Upon EMS arrival, the patient was hypoxic with O2 saturation in the 66 to 70% on room air.  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  She was placed on 15 L nonrebreather and brought into the ER for further evaluation.   In the ER, pt was notably tachycardic and tachypneic.  Chest x-ray showed left lung base airspace opacity and moderate left pleural effusion.  Mild pulmonary edema.  Stable cardiomegaly.  Right upper lobe pulmonary nodule measuring 7 mm.  Lab studies notable for proBNP greater than 15,000.  Twelve-lead EKG, atrial fibrillation with a rate of 108, QTc 534.   The patient received IV Lasix  80 mg x 1.  TRH, hospitalist service, was asked to admit for further management of acute on chronic HFrEF.   ED Course: Temperature 97.6.  BP 151/97, pulse 103, respiratory rate 23.  O2 saturation 67% on room air.   Assessment/Plan:  Acute on chronic HFrEF -Presented with cardiomegaly, pulmonary edema, left pleural effusion, severe volume overload. Last 2D echo done on 11/17/2023 revealed LVEF 35 to 40% -IV furosemide  40 mg BID, increased by cardiology to 80 mg IV TID on 02/18/24>>continue through 1/8 -strict I's and O's and daily weight -02/17/24 Echo--EF 25-30%, + WMA, severe decrease RVF, RVSP 46.4, small pericardial eff -appreciate cardiology--Not  candidate for advanced heart failure strategies per workup within the last 6 months  -continue bisoprolol , spironolactone    Moderate left pleural effusion -thoracentesis on 02/18/24 with 100 mL fluid removed  -fluid appears transudative   PAF -rate controlled -continue bisoprolol  -CHA2DS2-VASc score is 6.  -Poor candidate for anticoagulation due to GIB and not a good candidate for Watchman device  - anticoagulation discontinued in November 2025 and shared decision made to hold off on any further efforts at anticoagulation    Hypothyroidism -02/16/24 TSH--6.110 -Resumed home levothyroxine    Type 2 diabetes with hyperglycemia -Hemoglobin A1c 6.6 on 11/23/2023 -continue lantus  10 units daily -continue novolog  sliding scale - CBGs controlled   CKD 3B -baseline creatinine 1.2-1.5 -monitor with diuresis   Advanced hepatic fibrosis Chronic hepatic fibrosis With chronic mildly abnormal LFTs, monitor GI recommended to continue lactulose , if patient refuses and recurrent AMS then start Xifaxan  550 mg BID So far, pt has been taking the lactulose     Elevated liver chemistries Alkaline phosphatase vitamin D , AST 48, T. bili 2.3 Secondary to chronic hepatic congestion from bi-ventricular HF Hepatic function panel stable    Hypervolemic hyponatremia Serum sodium 127>>131 Continue diuresing>>improved   Non anion gap metabolic acidosis (NAGMA) Serum bicarb 19, anion gap 15 -improved with diuresis   GERD Resume home PPI   Hyperlipidemia Resume home Crestor    Obesity, class 2 BMI 36 initially Recommend weight loss outpatient with regular physical activity and healthy dieting   History of DVT in 2017 Was taken off Eliquis  prior to admission due to  history of GI bleed Continue subcu insulin  for DVT prophylaxis.   QTc prolongation. QTc 534 on admission 12 EKG Avoid QTc prolonging agents Optimize magnesium  and potassium levels.   Monitor on telemetry.   MGUS Outpatient  follow-up with medical oncology.   Right upper lobe pulmonary nodule measuring 7 mm Outpatient follow-up.                 Family Communication: no  Family at bedside   Consultants:  cardiology   Code Status:  FULL    DVT Prophylaxis:   Diagonal Lovenox      Procedures: As Listed in Progress Note Above   Antibiotics: None       Subjective: Pt feels she is breathing better and her LE edema is improving.  Denies cp, n/v/d, abd pain, f/c  Objective: Vitals:   02/23/24 1300 02/23/24 1936 02/24/24 0525 02/24/24 1319  BP: 112/78 (!) 125/93 130/76 124/86  Pulse: 96 86 84 83  Resp:  16  17  Temp: 98.6 F (37 C) 98 F (36.7 C) 98.1 F (36.7 C) 98.3 F (36.8 C)  TempSrc: Oral Oral Oral   SpO2: 95% 100% 100% 99%  Weight:   103.5 kg   Height:        Intake/Output Summary (Last 24 hours) at 02/24/2024 1500 Last data filed at 02/24/2024 0908 Gross per 24 hour  Intake 480 ml  Output 1250 ml  Net -770 ml   Weight change: -0.3 kg Exam:  General:  Pt is alert, follows commands appropriately, not in acute distress HEENT: No icterus, No thrush, No neck mass, [place]/AT Cardiovascular: RRR, S1/S2, no rubs, no gallops Respiratory: bibasilar crackles.  No wheeze Abdomen: Soft/+BS, non tender, non distended, no guarding Extremities: trace LE edema, No lymphangitis, No petechiae, No rashes, no synovitis   Data Reviewed: I have personally reviewed following labs and imaging studies Basic Metabolic Panel: Recent Labs  Lab 02/18/24 0516 02/19/24 0458 02/20/24 0554 02/21/24 0539 02/22/24 0441 02/23/24 0459 02/24/24 0503  NA 130* 131* 133* 133* 134* 133* 131*  K 4.1 4.0 4.1 4.0 3.9 4.1 4.7  CL 97* 97* 98 99 99 98 97*  CO2 28 30 28 29 29 27 23   GLUCOSE 137* 153* 120* 89 114* 116* 105*  BUN 29* 27* 27* 27* 26* 25* 25*  CREATININE 1.19* 1.17* 1.23* 1.29* 1.42* 1.27* 1.36*  CALCIUM  8.8* 8.8* 8.9 8.7* 8.8* 8.8* 8.8*  MG 1.8 1.9  --   --   --  2.2 2.4   Liver Function  Tests: Recent Labs  Lab 02/18/24 0516  AST 33  ALT 20  ALKPHOS 505*  BILITOT 1.9*  PROT 7.6  ALBUMIN  2.9*   No results for input(s): LIPASE, AMYLASE in the last 168 hours. No results for input(s): AMMONIA in the last 168 hours. Coagulation Profile: No results for input(s): INR, PROTIME in the last 168 hours. CBC: Recent Labs  Lab 02/18/24 0516 02/23/24 0459  WBC 8.7 7.8  HGB 10.6* 10.5*  HCT 33.3* 35.2*  MCV 80.2 82.8  PLT 219 169   Cardiac Enzymes: No results for input(s): CKTOTAL, CKMB, CKMBINDEX, TROPONINI in the last 168 hours. BNP: Invalid input(s): POCBNP CBG: Recent Labs  Lab 02/23/24 1126 02/23/24 1604 02/23/24 2005 02/24/24 0718 02/24/24 1117  GLUCAP 140* 201* 151* 124* 164*   HbA1C: No results for input(s): HGBA1C in the last 72 hours. Urine analysis:    Component Value Date/Time   COLORURINE YELLOW 12/21/2023 1512   APPEARANCEUR CLEAR 12/21/2023  1512   LABSPEC 1.012 12/21/2023 1512   PHURINE 5.0 12/21/2023 1512   GLUCOSEU NEGATIVE 12/21/2023 1512   HGBUR NEGATIVE 12/21/2023 1512   BILIRUBINUR NEGATIVE 12/21/2023 1512   KETONESUR NEGATIVE 12/21/2023 1512   PROTEINUR NEGATIVE 12/21/2023 1512   UROBILINOGEN 0.2 02/10/2014 1255   NITRITE NEGATIVE 12/21/2023 1512   LEUKOCYTESUR NEGATIVE 12/21/2023 1512   Sepsis Labs: @LABRCNTIP (procalcitonin:4,lacticidven:4) ) Recent Results (from the past 240 hours)  Body fluid culture w Gram Stain     Status: None   Collection Time: 02/18/24 10:32 AM   Specimen: Pleura; Body Fluid  Result Value Ref Range Status   Specimen Description   Final    PLEURAL Performed at [facility], [address], [place], [place] [address]    Special Requests   Final    NONE Performed at [facility], [address] [place], [place] [address]    Gram Stain   Final    WBC PRESENT, PREDOMINANTLY MONONUCLEAR NO ORGANISMS SEEN CYTOSPIN SMEAR    Culture   Final    NO GROWTH 3  DAYS Performed at [facility], [address], [place], [place] [address]    Report Status 02/24/2024 FINAL  Final     Scheduled Meds:  bisoprolol   5 mg Oral Daily   cycloSPORINE   1 drop Both Eyes BID   DULoxetine   60 mg Oral BID   enoxaparin  (LOVENOX ) injection  40 mg Subcutaneous Q24H   furosemide   80 mg Intravenous TID   insulin  aspart  0-5 Units Subcutaneous QHS   insulin  aspart  0-9 Units Subcutaneous TID WC   insulin  glargine  10 Units Subcutaneous Daily   lactulose   20 g Oral BID   levothyroxine   88 mcg Oral QAC breakfast   pantoprazole   40 mg Oral BID   potassium chloride  SA  20 mEq Oral Daily   rosuvastatin   5 mg Oral Daily   spironolactone   25 mg Oral Daily   traZODone   150 mg Oral QPM   ursodiol   300 mg Oral BID   Continuous Infusions:  Procedures/Studies: DG CHEST PORT 1 VIEW Result Date: 02/20/2024 EXAM: 1 VIEW(S) XRAY OF THE CHEST 02/20/2024 06:21:04 AM COMPARISON: 02/18/2024 CLINICAL HISTORY: Acute heart failure (HCC); Pulmonary edema FINDINGS: LUNGS AND PLEURA: Decreased pulmonary edema. Hazy opacity overlying left lung persistent with silhouetting off of the left apex of the heart and left hemidiaphragm. Left carotid apex not visualized due to overlying lung disease. No pneumothorax. HEART AND MEDIASTINUM: Stable cardiomegaly. Unchanged cardiomediastinal silhouette. 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 IMPRESSION: 1. Decreased pulmonary edema. 2. Persistent opacity of the left lower lung zone. The finding may represent a combination of pleural effusion, atelectasis, and lung disease. Electronically signed by: Morgane Naveau MD 02/20/2024 08:39 AM EST RP Workstation: [id number]   ECHOCARDIOGRAM COMPLETE Result Date: 02/18/2024    ECHOCARDIOGRAM REPORT   Patient Name:   Stephanie [name] Vales Date of Exam: 02/17/2024 Medical Rec #:  [id number]   Height:       68.0 in Accession #:    [id number]  Weight:       238.1 lb Date of Birth:   10/25/1950   BSA:          2.201 m Patient Age:    73 years    BP:           160/93 mmHg Patient Gender: F           HR:  100 bpm. Exam Location:  [facility] Procedure: 2D Echo, Cardiac Doppler, Color Doppler and Intracardiac            Opacification Agent (Both Spectral and Color Flow Doppler were            utilized during procedure). Indications:    CHF- Acute Systolic l50.21  History:        Patient has prior history of Echocardiogram examinations, most                 recent 11/17/2023. CHF, TIA and Stroke, Arrythmias:Atrial                 Fibrillation; Risk Factors:Hypertension, Diabetes, Dyslipidemia                 and Former Smoker.  Sonographer:    Aida Pizza RCS Referring Phys: [id number] CAROLE N HALL IMPRESSIONS  1. No LV thrombus by Definity . Left ventricular ejection fraction, by estimation, is 25 to 30%. The left ventricle has severely decreased function. The left ventricle demonstrates regional wall motion abnormalities (see scoring diagram/findings for description). Left ventricular diastolic function could not be evaluated. There is the interventricular septum is flattened in systole and diastole, consistent with right ventricular pressure and volume overload.  2. Right ventricular systolic function is severely reduced. The right ventricular size is moderately enlarged. There is moderately elevated pulmonary artery systolic pressure. The estimated right ventricular systolic pressure is 46.4 mmHg.  3. Left atrial size was mildly dilated.  4. Right atrial size was mildly dilated.  5. A small pericardial effusion is present. The pericardial effusion is circumferential. Large pleural effusion in the left lateral region.  6. The mitral valve is normal in structure. Mild to moderate mitral valve regurgitation. No evidence of mitral stenosis.  7. The tricuspid valve is abnormal. Tricuspid valve regurgitation is moderate to severe.  8. The aortic valve has an indeterminant number of cusps. Aortic  valve regurgitation is not visualized. No aortic stenosis is present. Aortic valve mean gradient measures 3.0 mmHg.  9. The inferior vena cava is dilated in size with <50% respiratory variability, suggesting right atrial pressure of 15 mmHg. FINDINGS  Left Ventricle: No LV thrombus by Definity . Left ventricular ejection fraction, by estimation, is 25 to 30%. The left ventricle has severely decreased function. The left ventricle demonstrates regional wall motion abnormalities. Definity  contrast agent was given IV to delineate the left ventricular endocardial borders. Strain was performed and the global longitudinal strain is indeterminate. The left ventricular internal cavity size was normal in size. There is no left ventricular hypertrophy. The interventricular septum is flattened in systole and diastole, consistent with right ventricular pressure and volume overload. Left ventricular diastolic function could not be evaluated due to atrial fibrillation. Left ventricular diastolic function could  not be evaluated.  LV Wall Scoring: The entire lateral wall and anterior septum are akinetic. The inferior septum and apex are hypokinetic. Right Ventricle: The right ventricular size is moderately enlarged. No increase in right ventricular wall thickness. Right ventricular systolic function is severely reduced. There is moderately elevated pulmonary artery systolic pressure. The tricuspid regurgitant velocity is 2.80 m/s, and with an assumed right atrial pressure of 15 mmHg, the estimated right ventricular systolic pressure is 46.4 mmHg. Left Atrium: Left atrial size was mildly dilated. Right Atrium: Right atrial size was mildly dilated. Pericardium: A small pericardial effusion is present. The pericardial effusion is circumferential. Mitral Valve: The mitral valve is normal in structure. Mild to moderate  mitral valve regurgitation. No evidence of mitral valve stenosis. Tricuspid Valve: The tricuspid valve is abnormal.  Tricuspid valve regurgitation is moderate to severe. No evidence of tricuspid stenosis. Aortic Valve: The aortic valve has an indeterminant number of cusps. Aortic valve regurgitation is not visualized. No aortic stenosis is present. Aortic valve mean gradient measures 3.0 mmHg. Aortic valve peak gradient measures 4.8 mmHg. Aortic valve area, by VTI measures 2.45 cm. Pulmonic Valve: The pulmonic valve was not well visualized. Pulmonic valve regurgitation is trivial. No evidence of pulmonic stenosis. Aorta: The aortic root is normal in size and structure. Venous: The inferior vena cava is dilated in size with less than 50% respiratory variability, suggesting right atrial pressure of 15 mmHg. IAS/Shunts: No atrial level shunt detected by color flow Doppler. Additional Comments: 3D was performed not requiring image post processing on an independent workstation and was indeterminate. There is a large pleural effusion in the left lateral region.  LEFT VENTRICLE PLAX 2D LVIDd:         4.70 cm      Diastology LVIDs:         3.90 cm      LV e' medial:    5.22 cm/s LV PW:         1.00 cm      LV E/e' medial:  23.3 LV IVS:        1.00 cm      LV e' lateral:   8.95 cm/s LVOT diam:     1.90 cm      LV E/e' lateral: 13.6 LV SV:         50 LV SV Index:   23 LVOT Area:     2.84 cm  LV Volumes (MOD) LV vol d, MOD A2C: 113.0 ml LV vol d, MOD A4C: 110.0 ml LV vol s, MOD A2C: 69.0 ml LV vol s, MOD A4C: 77.3 ml LV SV MOD A2C:     44.0 ml LV SV MOD A4C:     110.0 ml LV SV MOD BP:      36.9 ml RIGHT VENTRICLE RV S prime:     9.70 cm/s TAPSE (M-mode): 2.0 cm LEFT ATRIUM             Index        RIGHT ATRIUM           Index LA diam:        4.10 cm 1.86 cm/m   RA Area:     22.40 cm LA Vol (A2C):   62.4 ml 28.35 ml/m  RA Volume:   69.80 ml  31.71 ml/m LA Vol (A4C):   75.2 ml 34.17 ml/m LA Biplane Vol: 74.5 ml 33.85 ml/m  AORTIC VALVE AV Area (Vmax):    2.40 cm AV Area (Vmean):   2.19 cm AV Area (VTI):     2.45 cm AV Vmax:            109.00 cm/s AV Vmean:          77.100 cm/s AV VTI:            0.206 m AV Peak Grad:      4.8 mmHg AV Mean Grad:      3.0 mmHg LVOT Vmax:         92.33 cm/s LVOT Vmean:        59.533 cm/s LVOT VTI:          0.178 m LVOT/AV VTI ratio: 0.86  AORTA Ao Root  diam: 3.20 cm MITRAL VALVE                TRICUSPID VALVE MV Area (PHT): 5.43 cm     TR Peak grad:   31.4 mmHg MV Decel Time: 140 msec     TR Vmax:        280.00 cm/s MR Peak grad: 64.6 mmHg MR Mean grad: 45.0 mmHg     SHUNTS MR Vmax:      402.00 cm/s   Systemic VTI:  0.18 m MR Vmean:     320.0 cm/s    Systemic Diam: 1.90 cm MV E velocity: 121.67 cm/s MV A velocity: 98.00 cm/s MV E/A ratio:  1.24 Vishnu Priya Mallipeddi Electronically signed by Diannah Late Mallipeddi Signature Date/Time: 02/18/2024/3:47:41 PM    Final    DG Chest 1 View Result Date: 02/18/2024 CLINICAL DATA:  Post thoracentesis. EXAM: CHEST  1 VIEW COMPARISON:  02/16/2024 FINDINGS: Diminished left pleural effusion post thoracentesis. No visible pneumothorax. Persistent opacity at the left lung base. Stable cardiomegaly. Generalized increase in bilateral perihilar opacities which may represent edema. IMPRESSION: 1. Diminished left pleural effusion post thoracentesis. No visible pneumothorax. 2. Persistent opacity at the left lung base. 3. Generalized increase in bilateral perihilar opacities which may represent edema. Electronically Signed   By: Andrea Gasman M.D.   On: 02/18/2024 11:52   US  THORACENTESIS ASP PLEURAL SPACE W/IMG GUIDE Result Date: 02/18/2024 INDICATION: Patient with history of congestive heart failure, found to have new left pleural effusion. Request for diagnostic and therapeutic thoracentesis. EXAM: ULTRASOUND GUIDED LEFT THORACENTESIS MEDICATIONS: 8 mL 1% lidocaine  COMPLICATIONS: None immediate. PROCEDURE: An ultrasound guided thoracentesis was thoroughly discussed with the patient and questions answered. The benefits, risks, alternatives and complications were also  discussed. The patient understands and wishes to proceed with the procedure. Written consent was obtained. Ultrasound was performed to localize and mark an adequate pocket of fluid in the left chest. The area was then prepped and draped in the normal sterile fashion. 1% Lidocaine  was used for local anesthesia. Under ultrasound guidance a 6 Fr Safe-T-Centesis catheter was introduced. Thoracentesis was performed. The catheter was removed and a dressing applied. FINDINGS: A total of approximately 100 mL of fluid was removed. Samples were sent to the laboratory as requested by the clinical team. IMPRESSION: Successful ultrasound guided left thoracentesis yielding 100 mL of pleural fluid. Performed by Clotilda Hesselbach, PA-C Electronically Signed   By: Ester Sides M.D.   On: 02/18/2024 10:58   DG Chest Port 1 View Result Date: 02/16/2024 EXAM: 1 VIEW(S) XRAY OF THE CHEST 02/16/2024 06:03:00 PM COMPARISON: 12/21/2023 CLINICAL HISTORY: sob FINDINGS: LUNGS AND PLEURA: Left lung base airspace opacity. Moderate left pleural effusion. Mild pulmonary edema. There is a right upper lobe pulmonary nodule measuring 7 mm. No pneumothorax. HEART AND MEDIASTINUM: Cardiomegaly, stable. BONES AND SOFT TISSUES: Right shoulder arthroplasty noted. IMPRESSION: 1. Left lung base airspace opacity and moderate left pleural effusion. 2. Mild pulmonary edema. 3. Stable cardiomegaly. 4. Right upper lobe pulmonary nodule measuring 7 mm. This can be further evaluated with non-emergent CT. Electronically signed by: Greig Pique MD 02/16/2024 07:17 PM EST RP Workstation: [id number]    Alm Schneider, DO  [facility]  If 7PM-7AM, please contact night-coverage [email or web address] Password [email or web address] 02/24/2024, 3:00 PM   LOS: 8 days   "

## 2024-02-24 NOTE — Progress Notes (Signed)
 Heart Failure Stewardship Pharmacy Note  PCP: [facility] PCP-Cardiologist: Jayson Sierras, MD  HPI: Stephanie [name] is a 75 y.o. 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 On admission, BNP was 87562, HS-troponin was not measured, and alkPhos was 505. Patient underwent thoracentesis 02/18/24. Chest x-ray noted generalized increase in bilateral perihilar opacities which may represent edema.   Pertinent cardiac history: TTE 10/2023 noted normal LVEF and G1DD. TTE 05/2021 showed LVEF down to 45-50% with G2DD, which subsequently improved to 50-55% in 12/2022. LVEF continued to decline to 40% in 05/2023, then further to 35-40% in 11/2023. RHC performed 11/2023, showing severe biventricular failure (R >L), RA 27, CO/CI 4.0/1.8. TTE this admission noted LVEF 25-30%, severely reduced RV function, small pericardial effusion, mild to moderate MR, moderate to severe TR, and dilated IVC.  Pertinent Lab Values: Creat  Date Value Ref Range Status  02/11/2023 0.95 0.60 - 1.00 mg/dL Final   Creatinine, Ser  Date Value Ref Range Status  02/24/2024 1.36 (H) 0.44 - 1.00 mg/dL Final   BUN  Date Value Ref Range Status  02/24/2024 25 (H) 8 - 23 mg/dL Final   Potassium  Date Value Ref Range Status  02/24/2024 4.7 3.5 - 5.1 mmol/L Final   Sodium  Date Value Ref Range Status  02/24/2024 131 (L) 135 - 145 mmol/L Final   Brain Natriuretic Peptide  Date Value Ref Range Status  02/11/2023 1,213 (H) <100 pg/mL Final    Comment:    . BNP levels increase with age in the general population with the highest values seen in individuals greater than 72 years of age. Reference: J. Am. Penne. Cardiol. 2002; 59:023-017. .    B Natriuretic  Peptide  Date Value Ref Range Status  12/16/2023 1,088.4 (H) 0.0 - 100.0 pg/mL Final    Comment:    Performed at [facility], [address] [place], [place] [address]   Magnesium   Date Value Ref Range Status  02/24/2024 2.4 1.7 - 2.4 mg/dL Final    Comment:    Performed at [facility], [address], [place], [place] [address]   Hemoglobin A1C  Date Value Ref Range Status  09/09/2021 8.8  Final   Hgb A1c MFr Bld  Date Value Ref Range Status  11/23/2023 6.6 (H) 4.8 - 5.6 % Final    Comment:    (NOTE) Diagnosis of Diabetes The following HbA1c ranges recommended by the American Diabetes Association (ADA) may be used as an aid in the diagnosis of diabetes mellitus.  Hemoglobin             Suggested A1C NGSP%              Diagnosis  <5.7                   Non Diabetic  5.7-6.4                Pre-Diabetic  >6.4                   Diabetic  <7.0                   Glycemic control for  adults with diabetes.     TSH  Date Value Ref Range Status  02/16/2024 6.110 (H) 0.350 - 4.500 uIU/mL Final    Comment:    Performed at [facility], [address] [place], [place] [address]  10/21/2023 4.54 (H) 0.40 - 4.50 mIU/L Final   LDH  Date Value Ref Range Status  04/15/2023 207 (H) 98 - 192 U/L Final    Comment:    Performed at [facility], [address] [place], [place] [address]   Vital Signs:  Temp:  [98 F (36.7 C)-98.6 F (37 C)] 98.1 F (36.7 C) (01/08 0525) Pulse Rate:  [84-96] 84 (01/08 0525) Cardiac Rhythm: Normal sinus rhythm (01/07 2000) Resp:  [16] 16 (01/07 1936) BP: (112-130)/(76-93) 130/76 (01/08 0525) SpO2:  [95 %-100 %] 100 % (01/08 0525) Weight:  [103.5 kg (228 lb 2.8 oz)] 103.5 kg (228 lb 2.8 oz) (01/08 0525)  Intake/Output Summary (Last 24 hours) at 02/24/2024 0757 Last data filed at 02/24/2024 9471 Gross per 24 hour  Intake 540 ml  Output 2650 ml  Net -2110 ml    Current Heart Failure Medications:  Loop diuretic:  furosemide  80 mg IV TID Beta-Blocker: bisoprolol  5 mg daily ACEI/ARB/ARNI: none MRA: spironolactone  25 mg daily SGLT2i: none Other: none  Prior to admission Heart Failure Medications:  Loop diuretic: torsemide  40 mg daily Beta-Blocker: bisoprolol  5 mg daily ACEI/ARB/ARNI: none MRA: none SGLT2i: none Other: none  Assessment: 1. Acute on chronic systolic heart failure (LVEF 25-30%) with severe RV failure, due to unknown etiology. NYHA class III-IV symptoms.   *Spoke with patinet via telephone today, so the below assessment is based on chart review and provider assessments. -Volume: Suspect patient nearly euvolemic. Creatinine is slightly up. Would consider transition to oral diuretics tomorrow if this correlates with in-person examination. -Hemodynamics: BP is normal to slightly elevated and HR 80s. -BB: Continue current dose of bisoprolol  5 mg daily. Would not titrate until euvolemic. CI on recent RHC was low and this may further decompensate. -ACEI/ARB/ARNI: Consider adding losartan  12.5 mg daily tomorrow if creatinine and BP are stable. -MRA: Continue spironolactone  25 mg daily. -SGLT2i: Patient denies any recent history of UTI. Would be a good candidate for SGLT2i given CKD. Consider adding today or tomorrow pending cost.  Plan: 1) Medication changes recommended at this time: -None today, could consider adding losartan  at low dose tomorrow pending BP and renal function  2) Patient assistance: -Pending  3) Education: - Patient has been educated on current HF medications and potential additions to HF medication regimen - Patient verbalizes understanding that over the next few months, these medication doses may change and more medications may be added to optimize HF regimen - Patient has been educated on basic disease state pathophysiology and goals of therapy  Please do not hesitate to reach out with questions or concerns,  Song Myre, PharmD, CPP, BCPS, [facility] Heart Failure  Pharmacist  Phone - [phone] 02/24/2024 7:57 AM

## 2024-02-24 NOTE — Telephone Encounter (Signed)
 Pharmacy Patient Advocate Encounter  Insurance verification completed.    The patient is insured through [facility]. Patient has Medicare and is not eligible for a copay card, but may be able to apply for patient assistance or Medicare RX Payment Plan (Patient Must reach out to their plan, if eligible for payment plan), if available.    Ran test claim for Jardiance  10mg  tablet and the current 30 day co-pay is $12.65.  Ran test claim for Farxiga 10mg  tablet and the current 30 day co-pay is $12.65.  This test claim was processed through [facility]- copay amounts may vary at other pharmacies due to pharmacy/plan contracts, or as the patient moves through the different stages of their insurance plan.

## 2024-02-25 DIAGNOSIS — I480 Paroxysmal atrial fibrillation: Secondary | ICD-10-CM

## 2024-02-25 DIAGNOSIS — J9611 Chronic respiratory failure with hypoxia: Secondary | ICD-10-CM | POA: Diagnosis not present

## 2024-02-25 DIAGNOSIS — N1832 Chronic kidney disease, stage 3b: Secondary | ICD-10-CM | POA: Diagnosis not present

## 2024-02-25 DIAGNOSIS — I5023 Acute on chronic systolic (congestive) heart failure: Secondary | ICD-10-CM | POA: Diagnosis not present

## 2024-02-25 LAB — MAGNESIUM: Magnesium: 2.3 mg/dL (ref 1.7–2.4)

## 2024-02-25 LAB — BASIC METABOLIC PANEL WITH GFR
Anion gap: 5 (ref 5–15)
BUN: 25 mg/dL — ABNORMAL HIGH (ref 8–23)
CO2: 32 mmol/L (ref 22–32)
Calcium: 8.9 mg/dL (ref 8.9–10.3)
Chloride: 97 mmol/L — ABNORMAL LOW (ref 98–111)
Creatinine, Ser: 1.31 mg/dL — ABNORMAL HIGH (ref 0.44–1.00)
GFR, Estimated: 43 mL/min — ABNORMAL LOW
Glucose, Bld: 128 mg/dL — ABNORMAL HIGH (ref 70–99)
Potassium: 4.3 mmol/L (ref 3.5–5.1)
Sodium: 134 mmol/L — ABNORMAL LOW (ref 135–145)

## 2024-02-25 LAB — GLUCOSE, CAPILLARY
Glucose-Capillary: 144 mg/dL — ABNORMAL HIGH (ref 70–99)
Glucose-Capillary: 281 mg/dL — ABNORMAL HIGH (ref 70–99)

## 2024-02-25 MED ORDER — TORSEMIDE 20 MG PO TABS: 60.0000 mg | ORAL_TABLET | Freq: Every day | ORAL | 1 refills | Status: DC

## 2024-02-25 MED ORDER — ALBUTEROL SULFATE HFA 108 (90 BASE) MCG/ACT IN AERS: 1.0000 | INHALATION_SPRAY | Freq: Four times a day (QID) | RESPIRATORY_TRACT | 0 refills | Status: DC | PRN

## 2024-02-25 MED ORDER — SACUBITRIL-VALSARTAN 24-26 MG PO TABS: 1.0000 | ORAL_TABLET | Freq: Two times a day (BID) | ORAL | 1 refills | Status: DC

## 2024-02-25 MED ORDER — TORSEMIDE 20 MG PO TABS
60.0000 mg | ORAL_TABLET | Freq: Every day | ORAL | Status: DC
  Administered 2024-02-25: 60 mg via ORAL
  Filled 2024-02-25: qty 3

## 2024-02-25 MED ORDER — SACUBITRIL-VALSARTAN 24-26 MG PO TABS
1.0000 | ORAL_TABLET | Freq: Two times a day (BID) | ORAL | Status: DC
  Administered 2024-02-25: 1 via ORAL
  Filled 2024-02-25: qty 1

## 2024-02-25 MED ORDER — LACTULOSE 10 GM/15ML PO SOLN: 20.0000 g | Freq: Two times a day (BID) | ORAL | 1 refills | Status: AC

## 2024-02-25 MED ORDER — SPIRONOLACTONE 25 MG PO TABS: 25.0000 mg | ORAL_TABLET | Freq: Every day | ORAL | 2 refills | Status: DC

## 2024-02-25 NOTE — Plan of Care (Signed)

## 2024-02-25 NOTE — Progress Notes (Signed)
 "   Progress Note  Patient Name: Stephanie [name] Date of Encounter: 02/25/2024  Primary Cardiologist: Jayson Sierras, MD  Interval Summary  Chart reviewed.  Urine output incomplete yesterday.  Weight trend down and renal function stable.  Breathing comfortably this morning.  Vital Signs  Vitals:   02/24/24 0525 02/24/24 1319 02/24/24 2026 02/25/24 0506  BP: 130/76 124/86 132/77 130/81  Pulse: 84 83 88 86  Resp:  17 18 20   Temp: 98.1 F (36.7 C) 98.3 F (36.8 C) 98.6 F (37 C) 98.4 F (36.9 C)  TempSrc: Oral  Oral Oral  SpO2: 100% 99% 100% 100%  Weight: 103.5 kg   102 kg  Height:        Intake/Output Summary (Last 24 hours) at 02/25/2024 0925 Last data filed at 02/24/2024 1756 Gross per 24 hour  Intake --  Output 300 ml  Net -300 ml   Filed Weights   02/23/24 0407 02/24/24 0525 02/25/24 0506  Weight: 103.8 kg 103.5 kg 102 kg    Physical Exam  GEN: No acute distress.   Neck: No JVD. Cardiac: Intermittently irregular, no gallop.  Respiratory: Nonlabored. Clear to auscultation bilaterally. GI: Soft, nontender, bowel sounds present. MS: Improving edema.  ECG/Telemetry  Telemetry reviewed showing sinus rhythm with PACs.  Labs  Chemistry Recent Labs  Lab 02/23/24 0459 02/24/24 0503 02/25/24 0438  NA 133* 131* 134*  K 4.1 4.7 4.3  CL 98 97* 97*  CO2 27 23 32  GLUCOSE 116* 105* 128*  BUN 25* 25* 25*  CREATININE 1.27* 1.36* 1.31*  CALCIUM  8.8* 8.8* 8.9  GFRNONAA 44* 41* 43*  ANIONGAP 8 11 5     Hematology Recent Labs  Lab 02/23/24 0459  WBC 7.8  RBC 4.25  HGB 10.5*  HCT 35.2*  MCV 82.8  MCH 24.7*  MCHC 29.8*  RDW 15.9*  PLT 169    Lipid Panel     Component Value Date/Time   CHOL 122 12/21/2023 1541   CHOL 186 03/16/2023 1015   TRIG 75 12/21/2023 1541   HDL 56 12/21/2023 1541   HDL 60 03/16/2023 1015   CHOLHDL 2.2 12/21/2023 1541   VLDL 15 12/21/2023 1541   LDLCALC 51 12/21/2023 1541   LDLCALC 109 (H) 03/16/2023 1015   LABVLDL 17  03/16/2023 1015    Cardiac Studies  Echocardiogram 02/17/2024:  1. No LV thrombus by Definity . Left ventricular ejection fraction, by  estimation, is 25 to 30%. The left ventricle has severely decreased  function. The left ventricle demonstrates regional wall motion  abnormalities (see scoring diagram/findings for  description). Left ventricular diastolic function could not be evaluated.  There is the interventricular septum is flattened in systole and diastole,  consistent with right ventricular pressure and volume overload.   2. Right ventricular systolic function is severely reduced. The right  ventricular size is moderately enlarged. There is moderately elevated  pulmonary artery systolic pressure. The estimated right ventricular  systolic pressure is 46.4 mmHg.   3. Left atrial size was mildly dilated.   4. Right atrial size was mildly dilated.   5. A small pericardial effusion is present. The pericardial effusion is  circumferential. Large pleural effusion in the left lateral region.   6. The mitral valve is normal in structure. Mild to moderate mitral valve  regurgitation. No evidence of mitral stenosis.   7. The tricuspid valve is abnormal. Tricuspid valve regurgitation is  moderate to severe.   8. The aortic valve has an indeterminant number of  cusps. Aortic valve  regurgitation is not visualized. No aortic stenosis is present. Aortic  valve mean gradient measures 3.0 mmHg.   9. The inferior vena cava is dilated in size with <50% respiratory  variability, suggesting right atrial pressure of 15 mmHg.   Assessment & Plan  1.  HFrEF with biventricular heart failure, LVEF 25 to 30% and with severe RV dysfunction.  Not candidate for advanced heart failure strategies per workup within the last 6 months (reviewed RHC and heart failure clinic data).  Managing medically although GDMT limited.  Did undergo therapeutic left-sided thoracentesis on January 2 as well.  She has had  symptomatic improvement with IV diuresis.  2.  CKD stage IIIb, creatinine creatinine 1.31 with GFR 43.  3.  Primary hypertension.  Recent systolics 120s to 130s.  4.  History of GI bleed in the setting of use of Eliquis  for treatment of DVT, anticoagulation discontinued in November 2025 and shared decision made to hold off on any further efforts at anticoagulation.  5.  Paroxysmal atrial fibrillation.  CHA2DS2-VASc score is 6.  Poor candidate for anticoagulation as discussed above and not a good candidate for Watchman device.  Managing with heart rate control strategy.  Anticipate discharge home today.  She has HF TOC visit on January 16 already scheduled.  Continue bisoprolol  5 mg daily and Aldactone  25 mg daily.  Start Entresto  24/26 mg twice daily as well, would hold off on SGLT2 inhibitor given prior yeast infections on Jardiance .  Switch to Demadex  60 mg daily with KCl 20 mEq daily.  We will sign off.  For questions or updates, please contact [facility] Please consult [email or web address] for contact info under   Signed, Jayson Sierras, MD  02/25/2024, 9:25 AM    "

## 2024-02-25 NOTE — TOC Transition Note (Signed)
 Transition of Care [facility]) - Discharge Note   Patient Details  Name: Stephanie [name] MRN: [id number] Date of Birth: Dec 21, 1950  Transition of Care [facility] CM/SW Contact:  Lucie Lunger, LCSWA Phone Number: 02/25/2024, 10:06 AM   Clinical Narrative:    CSW updated that pt is medically stable for D/C home today. CSW provided update to Lauraine with [facility] that plan is for D/C and HH orders have been placed by MD. [facility] agency will follow pt in the community. TOC signing off.   Final next level of care: Home w Home Health Services Barriers to Discharge: Barriers Resolved   Patient Goals and CMS Choice Patient states their goals for this hospitalization and ongoing recovery are:: return home CMS Medicare.gov Compare Post Acute Care list provided to:: Patient Choice offered to / list presented to : Patient, Adult Children      Discharge Placement                       Discharge Plan and Services Additional resources added to the After Visit Summary for   In-house Referral: Clinical Social Work Discharge Planning Services: NA Post Acute Care Choice: NA          DME Arranged: N/A DME Agency: NA       HH Arranged: PT HH Agency: [facility] Date HH Agency Contacted: 02/25/24   Representative spoke with at [facility] Agency: Lauraine  Social Drivers of Health (SDOH) Interventions SDOH Screenings   Food Insecurity: No Food Insecurity (02/16/2024)  Housing: Low Risk (02/16/2024)  Transportation Needs: No Transportation Needs (02/16/2024)  Utilities: Not At Risk (02/16/2024)  Depression (PHQ2-9): Low Risk (09/14/2023)  Social Connections: Moderately Isolated (02/16/2024)  Tobacco Use: Medium Risk (02/17/2024)     Readmission Risk Interventions    02/17/2024   11:46 AM 12/27/2023   10:08 AM 12/25/2023   10:02 AM  Readmission Risk Prevention Plan  Transportation Screening Complete Complete Complete  PCP or Specialist Appt within 3-5 Days Complete    HRI or Home Care Consult  Complete    Social Work Consult for Recovery Care Planning/Counseling Complete    Palliative Care Screening Not Applicable    Medication Review Oceanographer) Complete Complete Complete  PCP or Specialist appointment within 3-5 days of discharge  Complete   HRI or Home Care Consult  Complete Complete  SW Recovery Care/Counseling Consult  Complete Complete  Palliative Care Screening  Complete Not Applicable  Skilled Nursing Facility  Complete Complete

## 2024-02-25 NOTE — Progress Notes (Signed)
 Heart Failure Stewardship Pharmacy Note  PCP: [facility] PCP-Cardiologist: Jayson Sierras, MD  HPI: Stephanie [name] is a 74 y.o. 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 On admission, BNP was 87562, HS-troponin was not measured, and alkPhos was 505. Patient underwent thoracentesis 02/18/24. Chest x-ray noted generalized increase in bilateral perihilar opacities which may represent edema.   Pertinent cardiac history: TTE 10/2023 noted normal LVEF and G1DD. TTE 05/2021 showed LVEF down to 45-50% with G2DD, which subsequently improved to 50-55% in 12/2022. LVEF continued to decline to 40% in 05/2023, then further to 35-40% in 11/2023. RHC performed 11/2023, showing severe biventricular failure (R >L), RA 27, CO/CI 4.0/1.8. TTE this admission noted LVEF 25-30%, severely reduced RV function, small pericardial effusion, mild to moderate MR, moderate to severe TR, and dilated IVC.  Pertinent Lab Values: Creat  Date Value Ref Range Status  02/11/2023 0.95 0.60 - 1.00 mg/dL Final   Creatinine, Ser  Date Value Ref Range Status  02/25/2024 1.31 (H) 0.44 - 1.00 mg/dL Final   BUN  Date Value Ref Range Status  02/25/2024 25 (H) 8 - 23 mg/dL Final   Potassium  Date Value Ref Range Status  02/25/2024 4.3 3.5 - 5.1 mmol/L Final   Sodium  Date Value Ref Range Status  02/25/2024 134 (L) 135 - 145 mmol/L Final   Brain Natriuretic Peptide  Date Value Ref Range Status  02/11/2023 1,213 (H) <100 pg/mL Final    Comment:    . BNP levels increase with age in the general population with the highest values seen in individuals greater than 84 years of age. Reference: J. Am. Penne. Cardiol. 2002; 59:023-017. .    B Natriuretic  Peptide  Date Value Ref Range Status  12/16/2023 1,088.4 (H) 0.0 - 100.0 pg/mL Final    Comment:    Performed at [facility], [address] [place], [place] [address]   Magnesium   Date Value Ref Range Status  02/25/2024 2.3 1.7 - 2.4 mg/dL Final    Comment:    Performed at [facility], [address] [place], [place] [address]   Hemoglobin A1C  Date Value Ref Range Status  09/09/2021 8.8  Final   Hgb A1c MFr Bld  Date Value Ref Range Status  11/23/2023 6.6 (H) 4.8 - 5.6 % Final    Comment:    (NOTE) Diagnosis of Diabetes The following HbA1c ranges recommended by the American Diabetes Association (ADA) may be used as an aid in the diagnosis of diabetes mellitus.  Hemoglobin             Suggested A1C NGSP%              Diagnosis  <5.7                   Non Diabetic  5.7-6.4                Pre-Diabetic  >6.4                   Diabetic  <7.0                   Glycemic control for  adults with diabetes.     TSH  Date Value Ref Range Status  02/16/2024 6.110 (H) 0.350 - 4.500 uIU/mL Final    Comment:    Performed at [facility], [address], [place], [place] [address]  10/21/2023 4.54 (H) 0.40 - 4.50 mIU/L Final   LDH  Date Value Ref Range Status  04/15/2023 207 (H) 98 - 192 U/L Final    Comment:    Performed at [facility], [address] [place], [place] [address]   Vital Signs:  Temp:  [98.3 F (36.8 C)-98.6 F (37 C)] 98.4 F (36.9 C) (01/09 0506) Pulse Rate:  [83-88] 86 (01/09 0506) Cardiac Rhythm: Normal sinus rhythm (01/09 0756) Resp:  [17-20] 20 (01/09 0506) BP: (124-132)/(77-86) 130/81 (01/09 0506) SpO2:  [99 %-100 %] 100 % (01/09 0506) Weight:  [102 kg (224 lb 13.9 oz)] 102 kg (224 lb 13.9 oz) (01/09 0506)  Intake/Output Summary (Last 24 hours) at 02/25/2024 0858 Last data filed at 02/24/2024 1756 Gross per 24 hour  Intake 240 ml  Output 300 ml  Net -60 ml    Current Heart Failure Medications:  Loop diuretic:  furosemide  80 mg IV TID Beta-Blocker: bisoprolol  5 mg daily ACEI/ARB/ARNI: none MRA: spironolactone  25 mg daily SGLT2i: none Other: none  Prior to admission Heart Failure Medications:  Loop diuretic: torsemide  40 mg daily Beta-Blocker: bisoprolol  5 mg daily ACEI/ARB/ARNI: none MRA: none SGLT2i: none Other: none  Assessment: 1. Acute on chronic systolic heart failure (LVEF 25-30%) with severe RV failure, due to unknown etiology. NYHA class III-IV symptoms.   *Spoke with patinet via telephone today, so the below assessment is based on chart review and provider assessments. -Volume: Suspect patient nearly euvolemic. Creatinine is stable along with BUN. Currently on furosemide  80 mg IV TID. -Hemodynamics: BP is normal to slightly elevated and HR 80s. -BB: Continue current dose of bisoprolol  5 mg daily. Would not titrate until euvolemic. CI on recent RHC was low and this may further decompensate. -ACEI/ARB/ARNI: Consider adding Entresto  24-26 mg BID given BP has been stable. -MRA: Continue spironolactone  25 mg daily. -SGLT2i: Patient denies any recent history of UTI. Would be a good candidate for SGLT2i given CKD. Report history of yeast infection with Jardiance . Could consider Farxiga based on risk-benefit discussion.  Plan: 1) Medication changes recommended at this time: -Consider adding Entresto  24-26 mg BID  2) Patient assistance: -Farxiga, Jardiance , Entresto  are $12.65   3) Education: - Patient has been educated on current HF medications and potential additions to HF medication regimen - Patient verbalizes understanding that over the next few months, these medication doses may change and more medications may be added to optimize HF regimen - Patient has been educated on basic disease state pathophysiology and goals of therapy  Please do not hesitate to reach out with questions or concerns,  Jaun Bash, PharmD, CPP, BCPS, [facility] Heart Failure Pharmacist  Phone -  [phone] 02/25/2024 8:58 AM

## 2024-02-25 NOTE — Care Management Important Message (Signed)
 Important Message  Patient Details  Name: Stephanie [name] MRN: [id number] Date of Birth: 1950-11-30   Important Message Given:  N/A - LOS <3 / Initial given by admissions     Duwaine LITTIE Ada 02/25/2024, 3:41 PM

## 2024-02-25 NOTE — Discharge Summary (Signed)
 " Physician Discharge Summary   Patient: Stephanie [name] MRN: [id number] DOB: 02/12/1951  Admit date:     02/16/2024  Discharge date: 02/25/2024  Discharge Physician: Alm Bralyn Folkert   PCP: Roni, [facility]   Recommendations at discharge:   Please follow up with primary care provider within 1-2 weeks  Please repeat BMP and CBC in one week    Hospital Course: 74 y.o. female with medical history significant for MGUS, chronic HFrEF, history of DVT (was taken off Eliquis  prior to admission due to history of GI bleed), CVA, CKD 3B, COPD/asthma, chronic hypoxia on 3-4 L [place] at baseline, fibromyalgia, GERD, type 2 diabetes, hypertension, hyperlipidemia, hypothyroidism, obesity, who presents to the ER with complaints of shortness of breath x 3 days and worsening.  Associated with generalized swelling.  Endorses compliance with home medications.  She does not weigh herself daily.  EMS was activated.  Upon EMS arrival, the patient was hypoxic with O2 saturation in the 66 to 70% on room air.  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  She was placed on 15 L nonrebreather and brought into the ER for further evaluation.   In the ER, pt was notably tachycardic and tachypneic.  Chest x-ray showed left lung base airspace opacity and moderate left pleural effusion.  Mild pulmonary edema.  Stable cardiomegaly.  Right upper lobe pulmonary nodule measuring 7 mm.  Lab studies notable for proBNP greater than 15,000.  Twelve-lead EKG, atrial fibrillation with a rate of 108, QTc 534.   The patient received IV Lasix  80 mg x 1.  TRH, hospitalist service, was asked to admit for further management of acute on chronic HFrEF.   ED Course: Temperature 97.6.  BP 151/97, pulse 103, respiratory rate 23.  O2 saturation 67% on room air.  Assessment and Plan: Acute on chronic HFrEF -Presented with cardiomegaly, pulmonary edema, left pleural effusion, severe volume overload. Last 2D echo done on 11/17/2023 revealed LVEF 35 to 40% -IV furosemide  40 mg BID,  increased by cardiology to 80 mg IV TID on 02/18/24>>continue through 1/8 -strict I's and O's and daily weight -02/17/24 Echo--EF 25-30%, + WMA, severe decrease RVF, RVSP 46.4, small pericardial eff -appreciate cardiology--Not candidate for advanced heart failure strategies per workup within the last 6 months  -continue bisoprolol , spironolactone  -NEG 14 lbs,  d/c weight 224.8 lbs (102 kg)   Moderate left pleural effusion -thoracentesis on 02/18/24 with 100 mL fluid removed  -fluid appears transudative   PAF -rate controlled -continue bisoprolol  -CHA2DS2-VASc score is 6.  -Poor candidate for anticoagulation due to GIB and not a good candidate for Watchman device  - anticoagulation discontinued in November 2025 and shared decision made to hold off on any further efforts at anticoagulation    Hypothyroidism -02/16/24 TSH--6.110 -Resumed home levothyroxine    Type 2 diabetes with hyperglycemia -Hemoglobin A1c 6.6 on 11/23/2023 -continue lantus  10 units daily during hospitalization -continue novolog  sliding scale - CBGs controlled   CKD 3B -baseline creatinine 1.2-1.5 -serum creatinine 1.31 on day of d/c   Advanced hepatic fibrosis Chronic hepatic fibrosis With chronic mildly abnormal LFTs, monitor GI recommended to continue lactulose , if patient refuses and recurrent AMS then start Xifaxan  550 mg BID So far, pt has been taking the lactulose     Elevated liver chemistries Alkaline phosphatase vitamin D , AST 48, T. bili 2.3 Secondary to chronic hepatic congestion from bi-ventricular HF Hepatic function panel stable    Hypervolemic hyponatremia Serum sodium 127>>131>>134 Continue diuresing>>improved   Non anion gap metabolic acidosis (NAGMA) Serum  bicarb 19, anion gap 15 -improved with diuresis   GERD Resume home PPI   Hyperlipidemia Resume home Crestor    Obesity, class 2 BMI 36 initially Recommend weight loss outpatient with regular physical activity and healthy dieting    History of DVT in 2017 Was taken off Eliquis  prior to admission due to history of GI bleed Continue subcu insulin  for DVT prophylaxis.   QTc prolongation. QTc 534 on admission 12 EKG Avoid QTc prolonging agents Optimize magnesium  and potassium levels.   Monitor on telemetry.   MGUS Outpatient follow-up with medical oncology.   Right upper lobe pulmonary nodule measuring 7 mm Outpatient follow-up.      Consultants: cardiology Procedures performed: none  Disposition: Home Diet recommendation:  Cardiac and Carb modified diet DISCHARGE MEDICATION: Allergies as of 02/25/2024       Reactions   Tetracyclines & Related Anaphylaxis, Dermatitis, Rash   Banana Hives, Nausea And Vomiting   Jardiance  [empagliflozin ] Other (See Comments)   Yeast infections, fatigue   Penicillins Rash        Medication List     STOP taking these medications    apixaban  2.5 MG Tabs tablet Commonly known as: ELIQUIS    atorvastatin  80 MG tablet Commonly known as: LIPITOR    cimetidine  200 MG tablet Commonly known as: TAGAMET    Eliquis  5 MG Tabs tablet Generic drug: apixaban    Lantus  SoloStar 100 UNIT/ML Solostar Pen Generic drug: insulin  glargine   traZODone  150 MG tablet Commonly known as: DESYREL        TAKE these medications    Accu-Chek FastClix Lancets Misc Use 1 lancet to check blood glucose four (4) times daily as directed by provider.   Accu-Chek Guide Control Liqd See admin instructions.   Accu-Chek Guide Test test strip Generic drug: glucose blood Use 1 test strip four(4) times daily to monitor blood glucose as directed by provider.   albuterol  (2.5 MG/3ML) 0.083% nebulizer solution Commonly known as: PROVENTIL  Take 2.5 mg by nebulization every 6 (six) hours as needed for wheezing or shortness of breath.   albuterol  108 (90 Base) MCG/ACT inhaler Commonly known as: VENTOLIN  HFA Inhale 1-2 puffs into the lungs every 6 (six) hours as needed for wheezing or shortness  of breath.   Alcohol Pads 70 % Pads SMARTSIG:Pledget(s) Topical 4 Times Daily   bisoprolol  5 MG tablet Commonly known as: ZEBETA  Take 1 tablet (5 mg total) by mouth daily.   cholecalciferol  25 MCG (1000 UNIT) tablet Commonly known as: VITAMIN D3 Take 3,000 Units by mouth daily.   DULoxetine  60 MG capsule Commonly known as: CYMBALTA  Take 60 mg by mouth 2 (two) times daily.   Easy Comfort Pen Needles 31G X 5 MM Misc Generic drug: Insulin  Pen Needle INJECT IN SULIN EVERY DAY AS DIRECTED   Easy Mini Eject Lancing Device Misc 4 (four) times daily.   FreeStyle Libre 3 Plus Sensor Misc USE TO MONITOR GLUCOSE CONTINUOUSLY AS DIRECTED. CHANGE SENSOR EVERY 15 DAYS.   FreeStyle Libre 3 Reader Espiridion USE TO MONITOR GLUCOSE CONTINUOUSLY AS DIRECTED   HumaLOG  KwikPen 100 UNIT/ML KwikPen Generic drug: insulin  lispro Inject 2-8 Units into the skin 3 (three) times daily.   InterDry 10x144 Shee Change pad daily or if soiled   lactulose  10 GM/15ML solution Commonly known as: CHRONULAC  Take 30 mLs (20 g total) by mouth 2 (two) times daily. What changed: when to take this   levothyroxine  88 MCG tablet Commonly known as: SYNTHROID  Take 88 mcg by mouth daily before breakfast. What  changed: Another medication with the same name was removed. Continue taking this medication, and follow the directions you see here.   lidocaine  5 % Commonly known as: Lidoderm  Place 1 patch onto the skin daily. Remove & Discard patch within 12 hours or as directed by MD What changed:  when to take this reasons to take this   methocarbamol  500 MG tablet Commonly known as: ROBAXIN  Take 500 mg by mouth every 8 (eight) hours as needed for muscle spasms.   pantoprazole  40 MG tablet Commonly known as: PROTONIX  Take 1 tablet (40 mg total) by mouth 2 (two) times daily.   potassium chloride  SA 20 MEQ tablet Commonly known as: KLOR-CON  M Take 20 mEq by mouth daily.   Repatha  SureClick 140 MG/ML  Soaj Generic drug: Evolocumab  Inject 140 mg into the skin every 14 (fourteen) days.   Restasis  0.05 % ophthalmic emulsion Generic drug: cycloSPORINE  Place 1 drop into both eyes 2 (two) times daily.   rosuvastatin  5 MG tablet Commonly known as: Crestor  Take 1 tablet (5 mg total) by mouth daily.   sacubitril -valsartan  24-26 MG Commonly known as: ENTRESTO  Take 1 tablet by mouth 2 (two) times daily.   spironolactone  25 MG tablet Commonly known as: ALDACTONE  Take 1 tablet (25 mg total) by mouth daily. Start taking on: February 26, 2024   tirzepatide  12.5 MG/0.5ML Pen Commonly known as: MOUNJARO  Inject 12.5 mg into the skin once a week.   torsemide  20 MG tablet Commonly known as: DEMADEX  Take 3 tablets (60 mg total) by mouth daily. What changed: how much to take   traMADol  50 MG tablet Commonly known as: ULTRAM  Take 50 mg by mouth 3 (three) times daily as needed for severe pain (pain score 7-10).   Tresiba  FlexTouch 100 UNIT/ML FlexTouch Pen Generic drug: insulin  degludec Inject 15 Units into the skin at bedtime.   ursodiol  300 MG capsule Commonly known as: ACTIGALL  Take 300 mg by mouth 2 (two) times daily.        Contact information for after-discharge care     Home Medical Care     [facility]) .   Service: Home Health Services Contact information: [phone] [facility] [place]  [phone]                    Discharge Exam: Filed Weights   02/23/24 0407 02/24/24 0525 02/25/24 0506  Weight: 103.8 kg 103.5 kg 102 kg   HEENT:  [place]/AT, No thrush, no icterus CV:  RRR, no rub, no S3, no S4 Lung:  CTA, no wheeze, no rhonchi Abd:  soft/+BS, NT Ext:  No edema, no lymphangitis, no synovitis, no rash   Condition at discharge: stable  The results of significant diagnostics from this hospitalization (including imaging, microbiology, ancillary and laboratory) are listed below for reference.   Imaging  Studies: DG CHEST PORT 1 VIEW Result Date: 02/20/2024 EXAM: 1 VIEW(S) XRAY OF THE CHEST 02/20/2024 06:21:04 AM COMPARISON: 02/18/2024 CLINICAL HISTORY: Acute heart failure (HCC); Pulmonary edema FINDINGS: LUNGS AND PLEURA: Decreased pulmonary edema. Hazy opacity overlying left lung persistent with silhouetting off of the left apex of the heart and left hemidiaphragm. Left carotid apex not visualized due to overlying lung disease. No pneumothorax. HEART AND MEDIASTINUM: Stable cardiomegaly. Unchanged cardiomediastinal silhouette. 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 IMPRESSION: 1. Decreased pulmonary edema. 2. Persistent opacity of the left lower lung zone. The finding may represent a combination of pleural  effusion, atelectasis, and lung disease. Electronically signed by: Morgane Naveau MD 02/20/2024 08:39 AM EST RP Workstation: [id number]   ECHOCARDIOGRAM COMPLETE Result Date: 02/18/2024    ECHOCARDIOGRAM REPORT   Patient Name:   Stephanie [name] Settlemyre Date of Exam: 02/17/2024 Medical Rec #:  [id number]   Height:       68.0 in Accession #:    [id number]  Weight:       238.1 lb Date of Birth:  04-07-1950   BSA:          2.201 m Patient Age:    73 years    BP:           160/93 mmHg Patient Gender: F           HR:           100 bpm. Exam Location:  [facility] Procedure: 2D Echo, Cardiac Doppler, Color Doppler and Intracardiac            Opacification Agent (Both Spectral and Color Flow Doppler were            utilized during procedure). Indications:    CHF- Acute Systolic l50.21  History:        Patient has prior history of Echocardiogram examinations, most                 recent 11/17/2023. CHF, TIA and Stroke, Arrythmias:Atrial                 Fibrillation; Risk Factors:Hypertension, Diabetes, Dyslipidemia                 and Former Smoker.  Sonographer:    Aida Pizza RCS Referring Phys: [id number] CAROLE N HALL IMPRESSIONS  1. No LV thrombus by [place] . Left ventricular ejection  fraction, by estimation, is 25 to 30%. The left ventricle has severely decreased function. The left ventricle demonstrates regional wall motion abnormalities (see scoring diagram/findings for description). Left ventricular diastolic function could not be evaluated. There is the interventricular septum is flattened in systole and diastole, consistent with right ventricular pressure and volume overload.  2. Right ventricular systolic function is severely reduced. The right ventricular size is moderately enlarged. There is moderately elevated pulmonary artery systolic pressure. The estimated right ventricular systolic pressure is 46.4 mmHg.  3. Left atrial size was mildly dilated.  4. Right atrial size was mildly dilated.  5. A small pericardial effusion is present. The pericardial effusion is circumferential. Large pleural effusion in the left lateral region.  6. The mitral valve is normal in structure. Mild to moderate mitral valve regurgitation. No evidence of mitral stenosis.  7. The tricuspid valve is abnormal. Tricuspid valve regurgitation is moderate to severe.  8. The aortic valve has an indeterminant number of cusps. Aortic valve regurgitation is not visualized. No aortic stenosis is present. Aortic valve mean gradient measures 3.0 mmHg.  9. The inferior vena cava is dilated in size with <50% respiratory variability, suggesting right atrial pressure of 15 mmHg. FINDINGS  Left Ventricle: No LV thrombus by [place] . Left ventricular ejection fraction, by estimation, is 25 to 30%. The left ventricle has severely decreased function. The left ventricle demonstrates regional wall motion abnormalities. Def[place]  contrast agent was given IV to delineate the left ventricular endocardial borders. Strain was performed and the global longitudinal strain is indeterminate. The left ventricular internal cavity size was normal in size. There is no left ventricular hypertrophy. The interventricular septum is flattened in  systole and diastole, consistent with right ventricular  pressure and volume overload. Left ventricular diastolic function could not be evaluated due to atrial fibrillation. Left ventricular diastolic function could  not be evaluated.  LV Wall Scoring: The entire lateral wall and anterior septum are akinetic. The inferior septum and apex are hypokinetic. Right Ventricle: The right ventricular size is moderately enlarged. No increase in right ventricular wall thickness. Right ventricular systolic function is severely reduced. There is moderately elevated pulmonary artery systolic pressure. The tricuspid regurgitant velocity is 2.80 m/s, and with an assumed right atrial pressure of 15 mmHg, the estimated right ventricular systolic pressure is 46.4 mmHg. Left Atrium: Left atrial size was mildly dilated. Right Atrium: Right atrial size was mildly dilated. Pericardium: A small pericardial effusion is present. The pericardial effusion is circumferential. Mitral Valve: The mitral valve is normal in structure. Mild to moderate mitral valve regurgitation. No evidence of mitral valve stenosis. Tricuspid Valve: The tricuspid valve is abnormal. Tricuspid valve regurgitation is moderate to severe. No evidence of tricuspid stenosis. Aortic Valve: The aortic valve has an indeterminant number of cusps. Aortic valve regurgitation is not visualized. No aortic stenosis is present. Aortic valve mean gradient measures 3.0 mmHg. Aortic valve peak gradient measures 4.8 mmHg. Aortic valve area, by VTI measures 2.45 cm. Pulmonic Valve: The pulmonic valve was not well visualized. Pulmonic valve regurgitation is trivial. No evidence of pulmonic stenosis. Aorta: The aortic root is normal in size and structure. Venous: The inferior vena cava is dilated in size with less than 50% respiratory variability, suggesting right atrial pressure of 15 mmHg. IAS/Shunts: No atrial level shunt detected by color flow Doppler. Additional Comments: 3D was  performed not requiring image post processing on an independent workstation and was indeterminate. There is a large pleural effusion in the left lateral region.  LEFT VENTRICLE PLAX 2D LVIDd:         4.70 cm      Diastology LVIDs:         3.90 cm      LV e' medial:    5.22 cm/s LV PW:         1.00 cm      LV E/e' medial:  23.3 LV IVS:        1.00 cm      LV e' lateral:   8.95 cm/s LVOT diam:     1.90 cm      LV E/e' lateral: 13.6 LV SV:         50 LV SV Index:   23 LVOT Area:     2.84 cm  LV Volumes (MOD) LV vol d, MOD A2C: 113.0 ml LV vol d, MOD A4C: 110.0 ml LV vol s, MOD A2C: 69.0 ml LV vol s, MOD A4C: 77.3 ml LV SV MOD A2C:     44.0 ml LV SV MOD A4C:     110.0 ml LV SV MOD BP:      36.9 ml RIGHT VENTRICLE RV S prime:     9.70 cm/s TAPSE (M-mode): 2.0 cm LEFT ATRIUM             Index        RIGHT ATRIUM           Index LA diam:        4.10 cm 1.86 cm/m   RA Area:     22.40 cm LA Vol (A2C):   62.4 ml 28.35 ml/m  RA Volume:   69.80 ml  31.71 ml/m LA Vol (A4C):   75.2 ml 34.17  ml/m LA Biplane Vol: 74.5 ml 33.85 ml/m  AORTIC VALVE AV Area (Vmax):    2.40 cm AV Area (Vmean):   2.19 cm AV Area (VTI):     2.45 cm AV Vmax:           109.00 cm/s AV Vmean:          77.100 cm/s AV VTI:            0.206 m AV Peak Grad:      4.8 mmHg AV Mean Grad:      3.0 mmHg LVOT Vmax:         92.33 cm/s LVOT Vmean:        59.533 cm/s LVOT VTI:          0.178 m LVOT/AV VTI ratio: 0.86  AORTA Ao Root diam: 3.20 cm MITRAL VALVE                TRICUSPID VALVE MV Area (PHT): 5.43 cm     TR Peak grad:   31.4 mmHg MV Decel Time: 140 msec     TR Vmax:        280.00 cm/s MR Peak grad: 64.6 mmHg MR Mean grad: 45.0 mmHg     SHUNTS MR Vmax:      402.00 cm/s   Systemic VTI:  0.18 m MR Vmean:     320.0 cm/s    Systemic Diam: 1.90 cm MV E velocity: 121.67 cm/s MV A velocity: 98.00 cm/s MV E/A ratio:  1.24 Vishnu Priya Mallipeddi Electronically signed by Diannah Late Mallipeddi Signature Date/Time: 02/18/2024/3:47:41 PM    Final    DG Chest 1  View Result Date: 02/18/2024 CLINICAL DATA:  Post thoracentesis. EXAM: CHEST  1 VIEW COMPARISON:  02/16/2024 FINDINGS: Diminished left pleural effusion post thoracentesis. No visible pneumothorax. Persistent opacity at the left lung base. Stable cardiomegaly. Generalized increase in bilateral perihilar opacities which may represent edema. IMPRESSION: 1. Diminished left pleural effusion post thoracentesis. No visible pneumothorax. 2. Persistent opacity at the left lung base. 3. Generalized increase in bilateral perihilar opacities which may represent edema. Electronically Signed   By: Andrea Gasman M.D.   On: 02/18/2024 11:52   US  THORACENTESIS ASP PLEURAL SPACE W/IMG GUIDE Result Date: 02/18/2024 INDICATION: Patient with history of congestive heart failure, found to have new left pleural effusion. Request for diagnostic and therapeutic thoracentesis. EXAM: ULTRASOUND GUIDED LEFT THORACENTESIS MEDICATIONS: 8 mL 1% lidocaine  COMPLICATIONS: None immediate. PROCEDURE: An ultrasound guided thoracentesis was thoroughly discussed with the patient and questions answered. The benefits, risks, alternatives and complications were also discussed. The patient understands and wishes to proceed with the procedure. Written consent was obtained. Ultrasound was performed to localize and mark an adequate pocket of fluid in the left chest. The area was then prepped and draped in the normal sterile fashion. 1% Lidocaine  was used for local anesthesia. Under ultrasound guidance a 6 Fr Safe-T-Centesis catheter was introduced. Thoracentesis was performed. The catheter was removed and a dressing applied. FINDINGS: A total of approximately 100 mL of fluid was removed. Samples were sent to the laboratory as requested by the clinical team. IMPRESSION: Successful ultrasound guided left thoracentesis yielding 100 mL of pleural fluid. Performed by Clotilda Hesselbach, PA-C Electronically Signed   By: Ester Sides M.D.   On: 02/18/2024 10:58    DG Chest Port 1 View Result Date: 02/16/2024 EXAM: 1 VIEW(S) XRAY OF THE CHEST 02/16/2024 06:03:00 PM COMPARISON: 12/21/2023 CLINICAL HISTORY: sob FINDINGS: LUNGS AND PLEURA: Left lung base  airspace opacity. Moderate left pleural effusion. Mild pulmonary edema. There is a right upper lobe pulmonary nodule measuring 7 mm. No pneumothorax. HEART AND MEDIASTINUM: Cardiomegaly, stable. BONES AND SOFT TISSUES: Right shoulder arthroplasty noted. IMPRESSION: 1. Left lung base airspace opacity and moderate left pleural effusion. 2. Mild pulmonary edema. 3. Stable cardiomegaly. 4. Right upper lobe pulmonary nodule measuring 7 mm. This can be further evaluated with non-emergent CT. Electronically signed by: Greig Pique MD 02/16/2024 07:17 PM EST RP Workstation: [id number]    Microbiology: Results for orders placed or performed during the hospital encounter of 02/16/24  Body fluid culture w Gram Stain     Status: None   Collection Time: 02/18/24 10:32 AM   Specimen: Pleura; Body Fluid  Result Value Ref Range Status   Specimen Description   Final    PLEURAL Performed at [facility], [address] [place], [place] [address]    Special Requests   Final    NONE Performed at [facility], [address], [place], [place] [address]    Gram Stain   Final    WBC PRESENT, PREDOMINANTLY MONONUCLEAR NO ORGANISMS SEEN CYTOSPIN SMEAR    Culture   Final    NO GROWTH 3 DAYS Performed at [facility], [address], [place], [place] [address]    Report Status 02/24/2024 FINAL  Final    Labs: CBC: Recent Labs  Lab 02/23/24 0459  WBC 7.8  HGB 10.5*  HCT 35.2*  MCV 82.8  PLT 169   Basic Metabolic Panel: Recent Labs  Lab 02/19/24 0458 02/20/24 0554 02/21/24 0539 02/22/24 0441 02/23/24 0459 02/24/24 0503 02/25/24 0438  NA 131*   < > 133* 134* 133* 131* 134*  K 4.0   < > 4.0 3.9 4.1 4.7 4.3  CL 97*   < > 99 99 98 97* 97*  CO2 30   < > 29 29 27 23  32  GLUCOSE  153*   < > 89 114* 116* 105* 128*  BUN 27*   < > 27* 26* 25* 25* 25*  CREATININE 1.17*   < > 1.29* 1.42* 1.27* 1.36* 1.31*  CALCIUM  8.8*   < > 8.7* 8.8* 8.8* 8.8* 8.9  MG 1.9  --   --   --  2.2 2.4 2.3   < > = values in this interval not displayed.   Liver Function Tests: No results for input(s): AST, ALT, ALKPHOS, BILITOT, PROT, ALBUMIN  in the last 168 hours. CBG: Recent Labs  Lab 02/24/24 1117 02/24/24 1647 02/24/24 2028 02/25/24 0750 02/25/24 1140  GLUCAP 164* 127* 200* 144* 281*    Discharge time spent: greater than 30 minutes.  Signed: Alm Schneider, MD [facility] 02/25/2024 "

## 2024-02-25 NOTE — Progress Notes (Signed)
 Mobility Specialist Progress Note:    02/25/24 1410  Mobility  Activity Pivoted/transferred to/from [facility]  Level of Assistance Minimal assist, patient does 75% or more  Assistive Device Front wheel walker  Distance Ambulated (ft) 3 ft  Range of Motion/Exercises Active;All extremities  Activity Response Tolerated well  Mobility Referral Yes  Mobility visit 1 Mobility  Mobility Specialist Start Time (ACUTE ONLY) 1410  Mobility Specialist Stop Time (ACUTE ONLY) 1428  Mobility Specialist Time Calculation (min) (ACUTE ONLY) 18 min   Pt received on BSC, requesting assistance back to bed. Required MinA to stand and transfer with RW. Tolerated well,asx throughout. RN in room, alarm on. All needs met.  Stephanie [name] Mobility Specialist Please contact via Special Educational Needs Teacher or  Rehab office at [phone]

## 2024-02-28 LAB — MISC LABCORP TEST (SEND OUT): Labcorp test code: 9985

## 2024-03-02 ENCOUNTER — Telehealth: Payer: Self-pay

## 2024-03-02 NOTE — Telephone Encounter (Signed)
 Called to confirm/remind patient of their appointment at [facility] on 03/03/24.   Appointment:   [] Confirmed  [x] Left mess   [] No answer/No voice mail  [] VM Full/unable to leave message  [] Phone not in service  And to bring in all medications and/or complete list.

## 2024-03-03 ENCOUNTER — Ambulatory Visit (HOSPITAL_COMMUNITY)

## 2024-03-03 ENCOUNTER — Encounter (HOSPITAL_COMMUNITY): Payer: Self-pay

## 2024-03-08 ENCOUNTER — Inpatient Hospital Stay (HOSPITAL_COMMUNITY): Admission: EM | Admit: 2024-03-08 | Source: Home / Self Care | Admitting: Family Medicine

## 2024-03-08 ENCOUNTER — Emergency Department (HOSPITAL_COMMUNITY)

## 2024-03-08 ENCOUNTER — Other Ambulatory Visit: Payer: Self-pay

## 2024-03-08 ENCOUNTER — Telehealth: Payer: Self-pay | Admitting: Cardiology

## 2024-03-08 ENCOUNTER — Encounter (HOSPITAL_COMMUNITY): Payer: Self-pay

## 2024-03-08 DIAGNOSIS — Z7985 Long-term (current) use of injectable non-insulin antidiabetic drugs: Secondary | ICD-10-CM | POA: Diagnosis not present

## 2024-03-08 DIAGNOSIS — Z6834 Body mass index (BMI) 34.0-34.9, adult: Secondary | ICD-10-CM | POA: Diagnosis not present

## 2024-03-08 DIAGNOSIS — K7402 Hepatic fibrosis, advanced fibrosis: Secondary | ICD-10-CM | POA: Diagnosis present

## 2024-03-08 DIAGNOSIS — E1122 Type 2 diabetes mellitus with diabetic chronic kidney disease: Secondary | ICD-10-CM | POA: Diagnosis present

## 2024-03-08 DIAGNOSIS — I5023 Acute on chronic systolic (congestive) heart failure: Secondary | ICD-10-CM | POA: Diagnosis present

## 2024-03-08 DIAGNOSIS — E039 Hypothyroidism, unspecified: Secondary | ICD-10-CM | POA: Diagnosis present

## 2024-03-08 DIAGNOSIS — Z888 Allergy status to other drugs, medicaments and biological substances status: Secondary | ICD-10-CM

## 2024-03-08 DIAGNOSIS — Z91018 Allergy to other foods: Secondary | ICD-10-CM

## 2024-03-08 DIAGNOSIS — Z87891 Personal history of nicotine dependence: Secondary | ICD-10-CM

## 2024-03-08 DIAGNOSIS — J9611 Chronic respiratory failure with hypoxia: Secondary | ICD-10-CM | POA: Diagnosis present

## 2024-03-08 DIAGNOSIS — Z825 Family history of asthma and other chronic lower respiratory diseases: Secondary | ICD-10-CM

## 2024-03-08 DIAGNOSIS — M797 Fibromyalgia: Secondary | ICD-10-CM | POA: Diagnosis present

## 2024-03-08 DIAGNOSIS — E7800 Pure hypercholesterolemia, unspecified: Secondary | ICD-10-CM | POA: Diagnosis present

## 2024-03-08 DIAGNOSIS — J90 Pleural effusion, not elsewhere classified: Secondary | ICD-10-CM | POA: Diagnosis present

## 2024-03-08 DIAGNOSIS — Z86718 Personal history of other venous thrombosis and embolism: Secondary | ICD-10-CM

## 2024-03-08 DIAGNOSIS — R911 Solitary pulmonary nodule: Secondary | ICD-10-CM | POA: Diagnosis present

## 2024-03-08 DIAGNOSIS — I130 Hypertensive heart and chronic kidney disease with heart failure and stage 1 through stage 4 chronic kidney disease, or unspecified chronic kidney disease: Principal | ICD-10-CM | POA: Diagnosis present

## 2024-03-08 DIAGNOSIS — I509 Heart failure, unspecified: Secondary | ICD-10-CM

## 2024-03-08 DIAGNOSIS — K219 Gastro-esophageal reflux disease without esophagitis: Secondary | ICD-10-CM | POA: Diagnosis present

## 2024-03-08 DIAGNOSIS — Z79890 Hormone replacement therapy (postmenopausal): Secondary | ICD-10-CM

## 2024-03-08 DIAGNOSIS — Z8249 Family history of ischemic heart disease and other diseases of the circulatory system: Secondary | ICD-10-CM

## 2024-03-08 DIAGNOSIS — J4489 Other specified chronic obstructive pulmonary disease: Secondary | ICD-10-CM | POA: Diagnosis present

## 2024-03-08 DIAGNOSIS — Z800 Family history of malignant neoplasm of digestive organs: Secondary | ICD-10-CM

## 2024-03-08 DIAGNOSIS — I5082 Biventricular heart failure: Principal | ICD-10-CM | POA: Diagnosis present

## 2024-03-08 DIAGNOSIS — Z880 Allergy status to penicillin: Secondary | ICD-10-CM

## 2024-03-08 DIAGNOSIS — Z8616 Personal history of COVID-19: Secondary | ICD-10-CM

## 2024-03-08 DIAGNOSIS — E66811 Obesity, class 1: Secondary | ICD-10-CM | POA: Diagnosis present

## 2024-03-08 DIAGNOSIS — Z820 Family history of epilepsy and other diseases of the nervous system: Secondary | ICD-10-CM

## 2024-03-08 DIAGNOSIS — Z9981 Dependence on supplemental oxygen: Secondary | ICD-10-CM | POA: Diagnosis not present

## 2024-03-08 DIAGNOSIS — J9621 Acute and chronic respiratory failure with hypoxia: Secondary | ICD-10-CM | POA: Diagnosis present

## 2024-03-08 DIAGNOSIS — Z96611 Presence of right artificial shoulder joint: Secondary | ICD-10-CM | POA: Diagnosis present

## 2024-03-08 DIAGNOSIS — E785 Hyperlipidemia, unspecified: Secondary | ICD-10-CM | POA: Diagnosis not present

## 2024-03-08 DIAGNOSIS — Z1152 Encounter for screening for COVID-19: Secondary | ICD-10-CM | POA: Diagnosis not present

## 2024-03-08 DIAGNOSIS — Z833 Family history of diabetes mellitus: Secondary | ICD-10-CM

## 2024-03-08 DIAGNOSIS — I480 Paroxysmal atrial fibrillation: Secondary | ICD-10-CM | POA: Diagnosis present

## 2024-03-08 DIAGNOSIS — Z881 Allergy status to other antibiotic agents status: Secondary | ICD-10-CM

## 2024-03-08 DIAGNOSIS — Z8261 Family history of arthritis: Secondary | ICD-10-CM

## 2024-03-08 DIAGNOSIS — D472 Monoclonal gammopathy: Secondary | ICD-10-CM | POA: Diagnosis present

## 2024-03-08 DIAGNOSIS — Z8349 Family history of other endocrine, nutritional and metabolic diseases: Secondary | ICD-10-CM

## 2024-03-08 DIAGNOSIS — I4891 Unspecified atrial fibrillation: Secondary | ICD-10-CM | POA: Diagnosis present

## 2024-03-08 DIAGNOSIS — R0602 Shortness of breath: Secondary | ICD-10-CM

## 2024-03-08 DIAGNOSIS — Z794 Long term (current) use of insulin: Secondary | ICD-10-CM

## 2024-03-08 DIAGNOSIS — N1832 Chronic kidney disease, stage 3b: Secondary | ICD-10-CM | POA: Diagnosis present

## 2024-03-08 DIAGNOSIS — E1169 Type 2 diabetes mellitus with other specified complication: Secondary | ICD-10-CM | POA: Diagnosis present

## 2024-03-08 DIAGNOSIS — Z8673 Personal history of transient ischemic attack (TIA), and cerebral infarction without residual deficits: Secondary | ICD-10-CM

## 2024-03-08 DIAGNOSIS — Z79899 Other long term (current) drug therapy: Secondary | ICD-10-CM

## 2024-03-08 DIAGNOSIS — Z823 Family history of stroke: Secondary | ICD-10-CM

## 2024-03-08 DIAGNOSIS — R0902 Hypoxemia: Secondary | ICD-10-CM

## 2024-03-08 LAB — COMPREHENSIVE METABOLIC PANEL WITH GFR
ALT: 21 U/L (ref 0–44)
AST: 40 U/L (ref 15–41)
Albumin: 3.2 g/dL — ABNORMAL LOW (ref 3.5–5.0)
Alkaline Phosphatase: 532 U/L — ABNORMAL HIGH (ref 38–126)
Anion gap: 16 — ABNORMAL HIGH (ref 5–15)
BUN: 38 mg/dL — ABNORMAL HIGH (ref 8–23)
CO2: 23 mmol/L (ref 22–32)
Calcium: 9 mg/dL (ref 8.9–10.3)
Chloride: 99 mmol/L (ref 98–111)
Creatinine, Ser: 1.17 mg/dL — ABNORMAL HIGH (ref 0.44–1.00)
GFR, Estimated: 49 mL/min — ABNORMAL LOW
Glucose, Bld: 190 mg/dL — ABNORMAL HIGH (ref 70–99)
Potassium: 4 mmol/L (ref 3.5–5.1)
Sodium: 138 mmol/L (ref 135–145)
Total Bilirubin: 5.4 mg/dL — ABNORMAL HIGH (ref 0.0–1.2)
Total Protein: 9 g/dL — ABNORMAL HIGH (ref 6.5–8.1)

## 2024-03-08 LAB — BLOOD GAS, ARTERIAL
Acid-Base Excess: 4 mmol/L — ABNORMAL HIGH (ref 0.0–2.0)
Bicarbonate: 27.6 mmol/L (ref 20.0–28.0)
Drawn by: 23430
O2 Saturation: 100 %
Patient temperature: 36.6
pCO2 arterial: 36 mmHg (ref 32–48)
pH, Arterial: 7.49 — ABNORMAL HIGH (ref 7.35–7.45)
pO2, Arterial: 267 mmHg — ABNORMAL HIGH (ref 83–108)

## 2024-03-08 LAB — CBC
HCT: 38.6 % (ref 36.0–46.0)
Hemoglobin: 11.7 g/dL — ABNORMAL LOW (ref 12.0–15.0)
MCH: 24.7 pg — ABNORMAL LOW (ref 26.0–34.0)
MCHC: 30.3 g/dL (ref 30.0–36.0)
MCV: 81.6 fL (ref 80.0–100.0)
Platelets: 320 K/uL (ref 150–400)
RBC: 4.73 MIL/uL (ref 3.87–5.11)
RDW: 19.7 % — ABNORMAL HIGH (ref 11.5–15.5)
WBC: 10.4 K/uL (ref 4.0–10.5)
nRBC: 0.2 % (ref 0.0–0.2)

## 2024-03-08 LAB — PRO BRAIN NATRIURETIC PEPTIDE: Pro Brain Natriuretic Peptide: >35000 pg/mL — ABNORMAL HIGH

## 2024-03-08 LAB — GLUCOSE, CAPILLARY: Glucose-Capillary: 177 mg/dL — ABNORMAL HIGH (ref 70–99)

## 2024-03-08 LAB — RESP PANEL BY RT-PCR (RSV, FLU A&B, COVID)  RVPGX2
Influenza A by PCR: NEGATIVE
Influenza B by PCR: NEGATIVE
Resp Syncytial Virus by PCR: NEGATIVE
SARS Coronavirus 2 by RT PCR: NEGATIVE

## 2024-03-08 LAB — MRSA NEXT GEN BY PCR, NASAL: MRSA by PCR Next Gen: NOT DETECTED

## 2024-03-08 MED ORDER — UMECLIDINIUM BROMIDE 62.5 MCG/ACT IN AEPB
1.0000 | INHALATION_SPRAY | Freq: Every day | RESPIRATORY_TRACT | Status: DC
  Administered 2024-03-10 – 2024-03-11 (×2): 1 via RESPIRATORY_TRACT
  Filled 2024-03-08: qty 7

## 2024-03-08 MED ORDER — ONDANSETRON HCL 4 MG PO TABS: 4.0000 mg | ORAL_TABLET | Freq: Four times a day (QID) | ORAL | Status: DC | PRN

## 2024-03-08 MED ORDER — PROCHLORPERAZINE EDISYLATE 10 MG/2ML IJ SOLN
10.0000 mg | Freq: Once | INTRAMUSCULAR | Status: AC | PRN
  Administered 2024-03-08: 10 mg via INTRAVENOUS
  Filled 2024-03-08: qty 2

## 2024-03-08 MED ORDER — MENTHOL 3 MG MT LOZG
1.0000 | LOZENGE | OROMUCOSAL | Status: DC | PRN
  Administered 2024-03-08: 3 mg via ORAL
  Filled 2024-03-08 (×2): qty 9

## 2024-03-08 MED ORDER — LEVOTHYROXINE SODIUM 100 MCG PO TABS
100.0000 ug | ORAL_TABLET | Freq: Every day | ORAL | Status: DC
  Administered 2024-03-09 – 2024-03-11 (×3): 100 ug via ORAL
  Filled 2024-03-08 (×3): qty 1

## 2024-03-08 MED ORDER — ACETAMINOPHEN 325 MG PO TABS
650.0000 mg | ORAL_TABLET | Freq: Four times a day (QID) | ORAL | Status: DC | PRN
  Administered 2024-03-08: 650 mg via ORAL
  Filled 2024-03-08: qty 2

## 2024-03-08 MED ORDER — PANTOPRAZOLE SODIUM 40 MG PO TBEC
40.0000 mg | DELAYED_RELEASE_TABLET | Freq: Two times a day (BID) | ORAL | Status: DC
  Administered 2024-03-08 – 2024-03-11 (×6): 40 mg via ORAL
  Filled 2024-03-08 (×6): qty 1

## 2024-03-08 MED ORDER — URSODIOL 300 MG PO CAPS
300.0000 mg | ORAL_CAPSULE | Freq: Two times a day (BID) | ORAL | Status: DC
  Administered 2024-03-09 – 2024-03-10 (×4): 300 mg via ORAL
  Filled 2024-03-08 (×8): qty 1

## 2024-03-08 MED ORDER — PHENOL 1.4 % MT LIQD: 1.0000 | OROMUCOSAL | Status: DC | PRN

## 2024-03-08 MED ORDER — INSULIN ASPART 100 UNIT/ML IJ SOLN
0.0000 [IU] | Freq: Three times a day (TID) | INTRAMUSCULAR | Status: DC
  Administered 2024-03-09 (×2): 3 [IU] via SUBCUTANEOUS
  Administered 2024-03-09 – 2024-03-11 (×4): 2 [IU] via SUBCUTANEOUS
  Filled 2024-03-08 (×6): qty 1

## 2024-03-08 MED ORDER — POLYETHYLENE GLYCOL 3350 17 G PO PACK: 17.0000 g | PACK | Freq: Every day | ORAL | Status: DC | PRN

## 2024-03-08 MED ORDER — BISOPROLOL FUMARATE 5 MG PO TABS
5.0000 mg | ORAL_TABLET | Freq: Every day | ORAL | Status: DC
  Administered 2024-03-08 – 2024-03-11 (×4): 5 mg via ORAL
  Filled 2024-03-08 (×4): qty 1

## 2024-03-08 MED ORDER — FUROSEMIDE 10 MG/ML IJ SOLN
60.0000 mg | Freq: Two times a day (BID) | INTRAMUSCULAR | Status: DC
  Administered 2024-03-08 – 2024-03-10 (×4): 60 mg via INTRAVENOUS
  Filled 2024-03-08 (×5): qty 6

## 2024-03-08 MED ORDER — BISACODYL 10 MG RE SUPP: 10.0000 mg | Freq: Every day | RECTAL | Status: DC | PRN

## 2024-03-08 MED ORDER — ONDANSETRON HCL 4 MG/2ML IJ SOLN
4.0000 mg | Freq: Four times a day (QID) | INTRAMUSCULAR | Status: DC | PRN
  Administered 2024-03-08: 4 mg via INTRAVENOUS
  Filled 2024-03-08: qty 2

## 2024-03-08 MED ORDER — ROSUVASTATIN CALCIUM 5 MG PO TABS
5.0000 mg | ORAL_TABLET | Freq: Every day | ORAL | Status: DC
  Administered 2024-03-08 – 2024-03-11 (×4): 5 mg via ORAL
  Filled 2024-03-08 (×4): qty 1

## 2024-03-08 MED ORDER — FUROSEMIDE 10 MG/ML IJ SOLN
120.0000 mg | INTRAVENOUS | Status: AC
  Administered 2024-03-08: 120 mg via INTRAVENOUS
  Filled 2024-03-08: qty 12

## 2024-03-08 MED ORDER — ALBUTEROL SULFATE (2.5 MG/3ML) 0.083% IN NEBU: 2.5000 mg | INHALATION_SOLUTION | RESPIRATORY_TRACT | Status: DC | PRN

## 2024-03-08 MED ORDER — DULOXETINE HCL 60 MG PO CPEP
60.0000 mg | ORAL_CAPSULE | Freq: Two times a day (BID) | ORAL | Status: DC
  Administered 2024-03-08 – 2024-03-11 (×6): 60 mg via ORAL
  Filled 2024-03-08 (×4): qty 1
  Filled 2024-03-08: qty 2
  Filled 2024-03-08: qty 1

## 2024-03-08 MED ORDER — SPIRONOLACTONE 25 MG PO TABS
25.0000 mg | ORAL_TABLET | Freq: Every day | ORAL | Status: DC
  Administered 2024-03-08 – 2024-03-11 (×4): 25 mg via ORAL
  Filled 2024-03-08 (×4): qty 1

## 2024-03-08 MED ORDER — SODIUM CHLORIDE 0.9% FLUSH
3.0000 mL | Freq: Two times a day (BID) | INTRAVENOUS | Status: DC
  Administered 2024-03-08 – 2024-03-11 (×6): 3 mL via INTRAVENOUS

## 2024-03-08 MED ORDER — HEPARIN SODIUM (PORCINE) 5000 UNIT/ML IJ SOLN
5000.0000 [IU] | Freq: Three times a day (TID) | INTRAMUSCULAR | Status: DC
  Administered 2024-03-08 – 2024-03-11 (×7): 5000 [IU] via SUBCUTANEOUS
  Filled 2024-03-08 (×8): qty 1

## 2024-03-08 MED ORDER — SACUBITRIL-VALSARTAN 24-26 MG PO TABS
1.0000 | ORAL_TABLET | Freq: Two times a day (BID) | ORAL | Status: DC
  Administered 2024-03-09 – 2024-03-11 (×5): 1 via ORAL
  Filled 2024-03-08 (×5): qty 1

## 2024-03-08 MED ORDER — ARFORMOTEROL TARTRATE 15 MCG/2ML IN NEBU
15.0000 ug | INHALATION_SOLUTION | Freq: Two times a day (BID) | RESPIRATORY_TRACT | Status: DC
  Administered 2024-03-08 – 2024-03-11 (×4): 15 ug via RESPIRATORY_TRACT
  Filled 2024-03-08 (×6): qty 2

## 2024-03-08 MED ORDER — CHLORHEXIDINE GLUCONATE CLOTH 2 % EX PADS
6.0000 | MEDICATED_PAD | Freq: Every day | CUTANEOUS | Status: DC
  Administered 2024-03-09 – 2024-03-11 (×3): 6 via TOPICAL

## 2024-03-08 MED ORDER — ACETAMINOPHEN 650 MG RE SUPP: 650.0000 mg | Freq: Four times a day (QID) | RECTAL | Status: DC | PRN

## 2024-03-08 MED ORDER — INSULIN ASPART 100 UNIT/ML IJ SOLN: 0.0000 [IU] | Freq: Every day | INTRAMUSCULAR | Status: DC

## 2024-03-08 MED ORDER — PHENOL 1.4 % MT LIQD: 2.0000 | Freq: Four times a day (QID) | OROMUCOSAL | Status: DC | PRN

## 2024-03-08 MED ORDER — INSULIN GLARGINE 100 UNIT/ML ~~LOC~~ SOLN
10.0000 [IU] | Freq: Every day | SUBCUTANEOUS | Status: DC
  Administered 2024-03-08 – 2024-03-10 (×3): 10 [IU] via SUBCUTANEOUS
  Filled 2024-03-08 (×4): qty 0.1

## 2024-03-08 MED ORDER — LACTULOSE 10 GM/15ML PO SOLN
20.0000 g | Freq: Two times a day (BID) | ORAL | Status: DC
  Administered 2024-03-09 – 2024-03-10 (×2): 20 g via ORAL
  Filled 2024-03-08 (×5): qty 30

## 2024-03-08 MED ORDER — TRAZODONE HCL 50 MG PO TABS
50.0000 mg | ORAL_TABLET | Freq: Every evening | ORAL | Status: DC | PRN
  Administered 2024-03-08 – 2024-03-10 (×2): 50 mg via ORAL
  Filled 2024-03-08 (×2): qty 1

## 2024-03-08 MED ORDER — SODIUM CHLORIDE 0.9 % IV SOLN: INTRAVENOUS | Status: AC | PRN

## 2024-03-08 MED ORDER — SODIUM CHLORIDE 0.9% FLUSH: 3.0000 mL | INTRAVENOUS | Status: DC | PRN

## 2024-03-08 MED ORDER — CYCLOSPORINE 0.05 % OP EMUL
1.0000 [drp] | Freq: Two times a day (BID) | OPHTHALMIC | Status: DC
  Administered 2024-03-08 – 2024-03-11 (×6): 1 [drp] via OPHTHALMIC
  Filled 2024-03-08 (×6): qty 30

## 2024-03-08 NOTE — ED Provider Notes (Signed)
 " [place] EMERGENCY DEPARTMENT AT [facility] Provider Note   CSN: [id number] Arrival date & time: 03/08/24  1030     Patient presents with: Shortness of Breath   Stephanie [name] is a 74 y.o. female.   74 year old female history of heart failure with EF of 25 to 30%, MGUS, DVT and atrial fibrillation not currently on anticoagulation due to a GI bleed, and COPD on 4 L nasal cannula at baseline who presents to the emergency department with shortness of breath.  Started last night.  Also has had a sore throat and productive cough.  No fevers or chills.  Says that she is only up 2 pounds from her baseline dry weight.  No significant leg swelling.  EMS arrived and had to place her on a nonrebreather.  They noted some rhonchi.       Prior to Admission medications  Medication Sig Start Date End Date Taking? Authorizing Provider  albuterol  (PROVENTIL ) (2.5 MG/3ML) 0.083% nebulizer solution Take 2.5 mg by nebulization every 6 (six) hours as needed for wheezing or shortness of breath.   Yes [provider]  albuterol  (VENTOLIN  HFA) 108 (90 Base) MCG/ACT inhaler Inhale 1-2 puffs into the lungs every 6 (six) hours as needed for wheezing or shortness of breath. 02/25/24  Yes TatAlm, MD  Alcohol Swabs (ALCOHOL PADS) 70 % PADS SMARTSIG:Pledget(s) Topical 4 Times Daily 08/12/20  Yes [provider]  bisoprolol  (ZEBETA ) 5 MG tablet Take 1 tablet (5 mg total) by mouth daily. 12/28/23  Yes Shahmehdi, Adriana LABOR, MD  cholecalciferol  (VITAMIN D3) 25 MCG (1000 UNIT) tablet Take 3,000 Units by mouth daily.   Yes [provider]  DULoxetine  (CYMBALTA ) 60 MG capsule Take 60 mg by mouth 2 (two) times daily.   Yes [provider]  Evolocumab  (REPATHA  SURECLICK) 140 MG/ML SOAJ Inject 140 mg into the skin every 14 (fourteen) days. 03/18/23  Yes McCue, Harlene, NP  HUMALOG  KWIKPEN 100 UNIT/ML KwikPen Inject 2-8 Units into the skin 3 (three) times daily.   Yes [provider]  lactulose  (CHRONULAC ) 10 GM/15ML solution Take 30 mLs (20 g total) by mouth 2 (two) times daily. 02/25/24  Yes Tat, Alm, MD  levothyroxine  (SYNTHROID ) 100 MCG tablet Take 100 mcg by mouth daily before breakfast.   Yes [provider]  lidocaine  (LIDODERM ) 5 % Place 1 patch onto the skin daily. Remove & Discard patch within 12 hours or as directed by MD Patient taking differently: Place 1 patch onto the skin daily as needed. Remove & Discard patch within 12 hours or as directed by MD 01/29/22  Yes Leath-Warren, Etta PARAS, NP  methocarbamol  (ROBAXIN ) 500 MG tablet Take 500 mg by mouth every 8 (eight) hours as needed for muscle spasms.   Yes [provider]  pantoprazole  (PROTONIX ) 40 MG tablet Take 1 tablet (40 mg total) by mouth 2 (two) times daily. 12/27/23 03/08/24 Yes Shahmehdi, Adriana LABOR, MD  potassium chloride  SA (KLOR-CON  M) 20 MEQ tablet Take 20 mEq by mouth daily. 12/21/23 03/08/24 Yes [provider]  RESTASIS  0.05 % ophthalmic emulsion Place 1 drop into both eyes 2 (two) times daily. 08/21/20  Yes [provider]  rosuvastatin  (CRESTOR ) 5 MG tablet Take 1 tablet (5 mg total) by mouth daily. 12/27/23 12/26/24 Yes Shahmehdi, Adriana LABOR, MD  sacubitril -valsartan  (ENTRESTO ) 24-26 MG Take 1 tablet by mouth 2 (two) times daily. 02/25/24  Yes Tat, Alm, MD  spironolactone  (ALDACTONE ) 25 MG tablet Take 1 tablet (  25 mg total) by mouth daily. 02/26/24  Yes Tat, Alm, MD  Tiotropium Bromide-Olodaterol (STIOLTO RESPIMAT ) 2.5-2.5 MCG/ACT AERS Inhale 2 puffs into the lungs daily.   Yes [provider]  tirzepatide  (MOUNJARO ) 12.5 MG/0.5ML Pen Inject 12.5 mg into the skin once a week. 06/14/23  Yes Therisa Benton PARAS, NP  torsemide  (DEMADEX ) 20 MG tablet Take 3 tablets (60 mg total) by mouth daily. Patient taking differently: Take 60-80 mg by mouth daily. 02/25/24  Yes Tat, Alm, MD  traMADol  (ULTRAM ) 50 MG tablet Take 50 mg by mouth 3 (three) times daily as  needed for severe pain (pain score 7-10).   Yes [provider]  TRESIBA  FLEXTOUCH 100 UNIT/ML FlexTouch Pen Inject 15 Units into the skin at bedtime.   Yes [provider]  ursodiol  (ACTIGALL ) 300 MG capsule Take 300 mg by mouth 2 (two) times daily.   Yes [provider]  Accu-Chek FastClix Lancets MISC Use 1 lancet to check blood glucose four (4) times daily as directed by provider. 09/23/23   Therisa Benton PARAS, NP  Blood Glucose Calibration (ACCU-CHEK GUIDE CONTROL) LIQD See admin instructions. 07/02/20   [provider]  Continuous Glucose Receiver (FREESTYLE LIBRE 3 READER) DEVI USE TO MONITOR GLUCOSE CONTINUOUSLY AS DIRECTED 06/15/23   Therisa Benton PARAS, NP  Continuous Glucose Sensor (FREESTYLE LIBRE 3 PLUS SENSOR) MISC USE TO MONITOR GLUCOSE CONTINUOUSLY AS DIRECTED. CHANGE SENSOR EVERY 15 DAYS. 06/17/23   Therisa Benton PARAS, NP  EASY COMFORT PEN NEEDLES 31G X 5 MM MISC INJECT IN SULIN EVERY DAY AS DIRECTED 06/02/21   [provider]  glucose blood (ACCU-CHEK GUIDE TEST) test strip Use 1 test strip four(4) times daily to monitor blood glucose as directed by provider. 09/23/23   Therisa Benton PARAS, NP  Lancet Devices (EASY MINI EJECT LANCING DEVICE) MISC 4 (four) times daily. 07/02/20   [provider]  levothyroxine  (SYNTHROID ) 88 MCG tablet Take 88 mcg by mouth daily before breakfast.    [provider]  Skin Protectants, Misc. (INTERDRY 89K855) SHEE Change pad daily or if soiled 10/21/23   Therisa Benton PARAS, NP    Allergies: Tetracyclines & related, Banana, Jardiance  [empagliflozin ], and Penicillins    Review of Systems  Updated Vital Signs BP (!) 172/101   Pulse (!) 115   Temp 97.8 F (36.6 C) (Oral)   Resp (!) 27   Ht 5' 8 (1.727 m)   Wt 96.5 kg   SpO2 100%   BMI 32.35 kg/m   Physical Exam Constitutional:      General: She is in acute distress.     Appearance: She is ill-appearing.  HENT:     Right Ear: External  ear normal.     Left Ear: External ear normal.     Mouth/Throat:     Mouth: Mucous membranes are moist.     Pharynx: Oropharynx is clear.  Eyes:     Extraocular Movements: Extraocular movements intact.     Conjunctiva/sclera: Conjunctivae normal.     Pupils: Pupils are equal, round, and reactive to light.  Cardiovascular:     Rate and Rhythm: Tachycardia present. Rhythm irregular.     Pulses: Normal pulses.     Heart sounds: Normal heart sounds.  Pulmonary:     Comments: Tachypneic.  On nonrebreather.  Diminished breath sounds in the bases.  Good air movement superiorly with no wheezing appreciated Musculoskeletal:     Right lower leg: Edema (1+) present.     Left lower  leg: Edema (1+) present.  Neurological:     Mental Status: She is alert and oriented to person, place, and time. Mental status is at baseline.     (all labs ordered are listed, but only abnormal results are displayed) Labs Reviewed  COMPREHENSIVE METABOLIC PANEL WITH GFR - Abnormal; Notable for the following components:      Result Value   Glucose, Bld 190 (*)    BUN 38 (*)    Creatinine, Ser 1.17 (*)    Total Protein 9.0 (*)    Albumin  3.2 (*)    Alkaline Phosphatase 532 (*)    Total Bilirubin 5.4 (*)    GFR, Estimated 49 (*)    Anion gap 16 (*)    All other components within normal limits  CBC - Abnormal; Notable for the following components:   Hemoglobin 11.7 (*)    MCH 24.7 (*)    RDW 19.7 (*)    All other components within normal limits  PRO BRAIN NATRIURETIC PEPTIDE - Abnormal; Notable for the following components:   Pro Brain Natriuretic Peptide >35,000.0 (*)    All other components within normal limits  BLOOD GAS, ARTERIAL - Abnormal; Notable for the following components:   pH, Arterial 7.49 (*)    pO2, Arterial 267 (*)    Acid-Base Excess 4.0 (*)    All other components within normal limits  RESP PANEL BY RT-PCR (RSV, FLU A&B, COVID)  RVPGX2  MRSA NEXT GEN BY PCR, NASAL  MRSA NEXT GEN BY  PCR, NASAL  BASIC METABOLIC PANEL WITH GFR    EKG: EKG Interpretation Date/Time:  Wednesday March 08 2024 11:02:02 EST Ventricular Rate:  113 PR Interval:    QRS Duration:  110 QT Interval:  480 QTC Calculation: 659 R Axis:   -5  Text Interpretation: Atrial fibrillation Low voltage, extremity leads Prolonged QT interval Confirmed by Yolande Charleston [id number]) on 03/08/2024 11:09:26 AM  Radiology: [facility] Chest Port 1 View Result Date: 03/08/2024 CLINICAL DATA:  Shortness of breath. EXAM: PORTABLE CHEST 1 VIEW COMPARISON:  February 20, 2024 FINDINGS: The cardiac silhouette is moderately enlarged and is unchanged in size. There is mild prominence of the central pulmonary vasculature. Mild, diffuse, increased interstitial lung markings are present. Mild to moderate severity left basilar atelectasis and/or infiltrate is seen. A moderate size left-sided pleural effusion is noted. No pneumothorax is identified. Multilevel degenerative changes are present throughout the thoracic spine. IMPRESSION: 1. Cardiomegaly with mild pulmonary vascular congestion and associated interstitial edema. 2. Mild to moderate severity left basilar atelectasis and/or infiltrate. 3. Moderate size left-sided pleural effusion. Electronically Signed   By: Suzen Dials M.D.   On: 03/08/2024 12:10     Procedures   Medications Ordered in the ED  furosemide  (LASIX ) injection 60 mg (has no administration in time range)  Chlorhexidine  Gluconate Cloth 2 % PADS 6 each (has no administration in time range)  bisoprolol  (ZEBETA ) tablet 5 mg (has no administration in time range)  rosuvastatin  (CRESTOR ) tablet 5 mg (has no administration in time range)  sacubitril -valsartan  (ENTRESTO ) 24-26 mg per tablet (has no administration in time range)  spironolactone  (ALDACTONE ) tablet 25 mg (has no administration in time range)  DULoxetine  (CYMBALTA ) DR capsule 60 mg (has no administration in time range)  levothyroxine  (SYNTHROID ) tablet  100 mcg (has no administration in time range)  insulin  glargine (LANTUS ) injection 10 Units (has no administration in time range)  lactulose  (CHRONULAC ) 10 GM/15ML solution 20 g (has no administration in time range)  pantoprazole  (PROTONIX ) EC tablet 40 mg (has no administration in time range)  ursodiol  (ACTIGALL ) capsule 300 mg (has no administration in time range)  albuterol  (PROVENTIL ) (2.5 MG/3ML) 0.083% nebulizer solution 2.5 mg (has no administration in time range)  arformoterol  (BROVANA ) nebulizer solution 15 mcg (15 mcg Nebulization Given 03/08/24 1931)    And  umeclidinium bromide  (INCRUSE ELLIPTA ) 62.5 MCG/ACT 1 puff (has no administration in time range)  cycloSPORINE  (RESTASIS ) 0.05 % ophthalmic emulsion 1 drop (has no administration in time range)  sodium chloride  flush (NS) 0.9 % injection 3 mL (has no administration in time range)  sodium chloride  flush (NS) 0.9 % injection 3 mL (has no administration in time range)  sodium chloride  flush (NS) 0.9 % injection 3 mL (has no administration in time range)  0.9 %  sodium chloride  infusion (has no administration in time range)  acetaminophen  (TYLENOL ) tablet 650 mg (has no administration in time range)    Or  acetaminophen  (TYLENOL ) suppository 650 mg (has no administration in time range)  traZODone  (DESYREL ) tablet 50 mg (has no administration in time range)  polyethylene glycol (MIRALAX  / GLYCOLAX ) packet 17 g (has no administration in time range)  bisacodyl  (DULCOLAX) suppository 10 mg (has no administration in time range)  ondansetron  (ZOFRAN ) tablet 4 mg ( Oral See Alternative 03/08/24 1939)    Or  ondansetron  (ZOFRAN ) injection 4 mg (4 mg Intravenous Given 03/08/24 1939)  heparin  injection 5,000 Units (has no administration in time range)  insulin  aspart (novoLOG ) injection 0-15 Units (has no administration in time range)  insulin  aspart (novoLOG ) injection 0-5 Units (has no administration in time range)  menthol  (CEPACOL)  lozenge 3 mg (has no administration in time range)  phenol (CHLORASEPTIC) mouth spray 2 spray (has no administration in time range)  furosemide  (LASIX ) 120 mg in dextrose  5 % 50 mL IVPB (120 mg Intravenous New Bag/Given 03/08/24 1220)    Clinical Course as of 03/08/24 1939  Wed Mar 08, 2024  1109 Full code verified with patient [RP]  1203 Creatinine(!): 1.17 At baseline [RP]  1203 Alkaline Phosphatase(!): 532 [RP]  1203 Total Bilirubin(!): 5.4 Worsening from prior.  Suspect congestive hepatopathy. [RP]  1223 Discussed with Dr Pearlean for admission.  [RP]    Clinical Course User Index [RP] Yolande Lamar BROCKS, MD                                 Medical Decision Making Amount and/or Complexity of Data Reviewed Labs: ordered. Decision-making details documented in ED Course. Radiology: ordered.  Risk Decision regarding hospitalization.   74 year old female history of heart failure with EF of 25 to 30%, MGUS, DVT and atrial fibrillation not currently on anticoagulation due to a GI bleed, and COPD on 4 L nasal cannula at baseline who presents to the emergency department with shortness of breath.  Initial Ddx:  CHF, COPD, bronchitis, URI, PE  MDM/Course:  Patient presents emergency department with shortness of breath.  Has had some URI type symptoms as well.  Denies any swelling recently.  On exam does have very high oxygen  requirements.  Required 13 L high flow nasal cannula.  Lung sounds are diminished bilaterally.  Superiorly she is moving good air and I do not appreciate any wheezing.  Does appear to have some lower extremity edema as well.  Chest x-ray is showing cardiomegaly with pulmonary vascular congestion and edema.  Along with left-sided pleural effusion.  Suspect  that CHF is the driving factor in her presentation today.  BNP is now undetectably high.  COVID and flu negative.  Has mild elevation in her LFTs and I suspect are from congestive hepatopathy.  Did consider PE but with  her other findings feel that heart failure is more likely.  Upon re-evaluation was requesting BiPAP though her work of breathing and oxygen  saturations had not significantly changed.  Did order this.  Discussed with hospitalist for admission  This patient presents to the ED for concern of complaints listed in HPI, this involves an extensive number of treatment options, and is a complaint that carries with it a high risk of complications and morbidity. Disposition including potential need for admission considered.   Dispo: Admit  I have reviewed the patients home medications and made adjustments as needed Additional history obtained from EMS Records reviewed DC Summary The following labs were independently interpreted: Chemistry and show elevated LFTs concerning for congestive hepatopathy I independently reviewed the following imaging with scope of interpretation limited to determining acute life threatening conditions related to emergency care: Chest x-ray and agree with the radiologist interpretation with the following exceptions: none I personally reviewed and interpreted cardiac monitoring: sinus tachycardia I personally reviewed and interpreted the pt's EKG: see above for interpretation  Consults: Hospitalist Social Determinants of health:  Geriatric  CRITICAL CARE Performed by: Lamar JAYSON Shan   Total critical care time: 30 minutes  Critical care time was exclusive of separately billable procedures and treating other patients.  Critical care was necessary to treat or prevent imminent or life-threatening deterioration.  Critical care was time spent personally by me on the following activities: development of treatment plan with patient and/or surrogate as well as nursing, discussions with consultants, evaluation of patient's response to treatment, examination of patient, obtaining history from patient or surrogate, ordering and performing treatments and interventions, ordering and  review of laboratory studies, ordering and review of radiographic studies, pulse oximetry and re-evaluation of patient's condition.  Portions of this note were generated with Scientist, clinical (histocompatibility and immunogenetics). Dictation errors may occur despite best attempts at proofreading.     Final diagnoses:  Biventricular congestive heart failure (HCC)  Hypoxia  Shortness of breath  Pleural effusion    ED Discharge Orders     None          Shan Lamar JAYSON, MD 03/08/24 1940  "

## 2024-03-08 NOTE — ED Notes (Signed)
 Spoke with pharmacy over the phone in which they will bring down Lasix  IVPB ASAP.

## 2024-03-08 NOTE — Progress Notes (Signed)
 Bipap is PRN order now.  Not needed at this time but will continue to monitor.

## 2024-03-08 NOTE — Plan of Care (Signed)
" °  Problem: Education: Goal: Knowledge of General Education information will improve Description: Including pain rating scale, medication(s)/side effects and non-pharmacologic comfort measures Outcome: Progressing   Problem: Health Behavior/Discharge Planning: Goal: Ability to manage health-related needs will improve Outcome: Progressing   Problem: Clinical Measurements: Goal: Ability to maintain clinical measurements within normal limits will improve Outcome: Progressing Goal: Will remain free from infection Outcome: Progressing Goal: Diagnostic test results will improve Outcome: Progressing Goal: Respiratory complications will improve Outcome: Progressing Goal: Cardiovascular complication will be avoided Outcome: Progressing   Problem: Activity: Goal: Risk for activity intolerance will decrease Outcome: Progressing   Problem: Nutrition: Goal: Adequate nutrition will be maintained Outcome: Not Applicable   Problem: Coping: Goal: Level of anxiety will decrease Outcome: Not Applicable   Problem: Elimination: Goal: Will not experience complications related to bowel motility Outcome: Progressing   Problem: Pain Managment: Goal: General experience of comfort will improve and/or be controlled Outcome: Not Applicable   Problem: Safety: Goal: Ability to remain free from injury will improve Outcome: Progressing   Problem: Skin Integrity: Goal: Risk for impaired skin integrity will decrease Outcome: Progressing   "

## 2024-03-08 NOTE — ED Triage Notes (Signed)
 Patient BIB [facility] from home for complaint SOB, Pt was on 4Ls at home, EMS unsure what original sat was, had to get patient out of the house. [facility] got oxygen  up to 90%. EMS noted rhonchi on left side. Symptoms started last night. Tachycardic and hypotensive.

## 2024-03-08 NOTE — H&P (Signed)
 " History and Physical    Patient: Stephanie [name] FMW:[id number] DOB: Oct 29, 1950 DOA: 03/08/2024 DOS: the patient was seen and examined on 03/08/2024 PCP: [facility]  Patient coming from: Home  Chief Complaint:  Chief Complaint  Patient presents with   Shortness of Breath   HPI: TRANA RESSLER is a 74 y.o. female with medical history significant for  MGUS, chronic HFrEF, history of DVT (was taken off Eliquis  prior to admission due to history of GI bleed), CVA, CKD 3B, COPD/asthma, chronic hypoxia on 3-4 L [place] at baseline, fibromyalgia, GERD, type 2 diabetes, hypertension, hyperlipidemia, hypothyroidism, obesity, ****  ***  COVID flu and RSV negative - Admission ABG without CO2 retention, respiratory alkalosis noted - proBNP elevated at greater than 35,000 on it was 12,000 on 02/21/2024 - Hgb 11.7 similar to priors, platelets 320 WBC 10.4 -- Sodium is 138, potassium 4.0, creatinine 1.17 improved from 1.3 about 12 days ago - LFTs remarkable for alk phos of 532 AST is only 40 and ALT is 21 T. bili up to 5.4 -- Chest x-ray consistent with cardiomegaly with mild pulmonary venous congestion and associated interstitial edema and moderate left-sided pleural effusion  Review of Systems: As mentioned in the history of present illness. All other systems reviewed and are negative. 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   Type 2 diabetes mellitus (HCC)    Past Surgical History:  Procedure Laterality Date   ABDOMINAL  HYSTERECTOMY  partial   CARPAL TUNNEL RELEASE Right 1991   CATARACT EXTRACTION W/PHACO Right 05/08/2013   Procedure: CATARACT EXTRACTION PHACO AND INTRAOCULAR LENS PLACEMENT (IOC);  Surgeon: Cherene Mania, MD;  Location: [facility];  Service: Ophthalmology;  Laterality: Right;  CDE 10.31   CATARACT EXTRACTION W/PHACO Left 08/17/2013   Procedure: CATARACT EXTRACTION PHACO AND INTRAOCULAR LENS PLACEMENT (IOC);  Surgeon: Cherene Mania, MD;  Location: [facility];  Service: Ophthalmology;  Laterality: Left;  CDE:9.03   CHOLECYSTECTOMY  1971   COLONOSCOPY WITH PROPOFOL  N/A 01/06/2016   Dr. Shaaron: diverticulosis    DENTAL SURGERY     ESOPHAGEAL BRUSHING  08/29/2019   Procedure: ESOPHAGEAL BRUSHING;  Surgeon: Shaaron Lamar HERO, MD;  Location: [facility];  Service: Endoscopy;;   ESOPHAGOGASTRODUODENOSCOPY (EGD) WITH PROPOFOL  N/A 01/06/2016   Dr. Shaaron: normal s/p empiric dilation    ESOPHAGOGASTRODUODENOSCOPY (EGD) WITH PROPOFOL  N/A 08/29/2019   esophageal plaques vs medication residue adherent to tubular esophagus s/p KOH brushing and dilation. Medium-sized hiatal hernia. + for candida. Diflucan .    EYE SURGERY     IR BONE MARROW BIOPSY & ASPIRATION  03/31/2022   MALONEY DILATION N/A 01/06/2016   Procedure: AGAPITO DILATION;  Surgeon: Lamar HERO Shaaron, MD;  Location: [facility];  Service: Endoscopy;  Laterality: N/A;   MALONEY DILATION N/A 08/29/2019   Procedure: AGAPITO DILATION;  Surgeon: Shaaron Lamar HERO, MD;  Location: [facility];  Service: Endoscopy;  Laterality: N/A;   REVERSE SHOULDER ARTHROPLASTY  Right 08/06/2017   Procedure: RIGHT REVERSE SHOULDER ARTHROPLASTY;  Surgeon: Kay Kemps, MD;  Location: [facility];  Service: Orthopedics;  Laterality: Right;   RIGHT HEART CATH N/A 11/26/2023   Procedure: RIGHT HEART CATH;  Surgeon: Cherrie Toribio SAUNDERS, MD;  Location: [facility];  Service: Cardiovascular;  Laterality: N/A;   WRIST GANGLION EXCISION Left    Social History:  reports that she quit smoking  about 13 years ago. Her smoking use included cigarettes. She started smoking about 43 years ago. She has a 7.5 pack-year smoking history. She has never used smokeless tobacco. She reports that she does not currently use alcohol. She reports that she does not use drugs.  Allergies[1]  Family History  Problem Relation Age of Onset   Hypertension Mother    Diabetes Mother    COPD Mother    Arthritis Mother    Diabetes Father    Arthritis Father    Dementia Father    CAD Father    Hypothyroidism Sister    Diabetes Brother    Stroke Paternal Aunt    Colon cancer Niece    Colon polyps Neg Hx    Sleep apnea Neg Hx     Prior to Admission medications  Medication Sig Start Date End Date Taking? Authorizing Provider  albuterol  (PROVENTIL ) (2.5 MG/3ML) 0.083% nebulizer solution Take 2.5 mg by nebulization every 6 (six) hours as needed for wheezing or shortness of breath.   Yes [provider]  albuterol  (VENTOLIN  HFA) 108 (90 Base) MCG/ACT inhaler Inhale 1-2 puffs into the lungs every 6 (six) hours as needed for wheezing or shortness of breath. 02/25/24  Yes TatAlm, MD  Alcohol Swabs (ALCOHOL PADS) 70 % PADS SMARTSIG:Pledget(s) Topical 4 Times Daily 08/12/20  Yes [provider]  bisoprolol  (ZEBETA ) 5 MG tablet Take 1 tablet (5 mg total) by mouth daily. 12/28/23  Yes Shahmehdi, Seyed A, MD  cholecalciferol  (VITAMIN D3) 25 MCG (1000 UNIT) tablet Take 3,000 Units by mouth daily.   Yes [provider]  DULoxetine  (CYMBALTA ) 60 MG capsule Take 60 mg by mouth 2 (two) times daily.   Yes [provider]  Evolocumab  (REPATHA  SURECLICK) 140 MG/ML SOAJ Inject 140 mg into the skin every 14 (fourteen) days. 03/18/23  Yes McCue, Harlene, NP  HUMALOG  KWIKPEN 100 UNIT/ML KwikPen Inject 2-8 Units into the skin 3 (three) times daily.   Yes [provider]  lactulose  (CHRONULAC ) 10 GM/15ML solution Take 30 mLs (20 g total) by mouth 2 (two) times daily. 02/25/24  Yes Tat,  Alm, MD  levothyroxine  (SYNTHROID ) 100 MCG tablet Take 100 mcg by mouth daily before breakfast.   Yes [provider]  lidocaine  (LIDODERM ) 5 % Place 1 patch onto the skin daily. Remove & Discard patch within 12 hours or as directed by MD Patient taking differently: Place 1 patch onto the skin daily as needed. Remove & Discard patch within 12 hours or as directed by MD 01/29/22  Yes Leath-Warren, Etta PARAS, NP  methocarbamol  (ROBAXIN ) 500 MG tablet Take 500 mg by mouth every 8 (eight) hours as needed for muscle spasms.   Yes [provider]  pantoprazole  (PROTONIX ) 40 MG tablet Take 1 tablet (40 mg total) by mouth 2 (two) times daily. 12/27/23 03/08/24 Yes Shahmehdi, Adriana LABOR, MD  potassium chloride  SA (KLOR-CON  M) 20 MEQ tablet Take 20 mEq by mouth daily. 12/21/23 03/08/24 Yes [provider]  RESTASIS  0.05 % ophthalmic emulsion Place 1 drop into both eyes 2 (  two) times daily. 08/21/20  Yes [provider]  rosuvastatin  (CRESTOR ) 5 MG tablet Take 1 tablet (5 mg total) by mouth daily. 12/27/23 12/26/24 Yes Shahmehdi, Adriana LABOR, MD  sacubitril -valsartan  (ENTRESTO ) 24-26 MG Take 1 tablet by mouth 2 (two) times daily. 02/25/24  Yes Tat, Alm, MD  spironolactone  (ALDACTONE ) 25 MG tablet Take 1 tablet (25 mg total) by mouth daily. 02/26/24  Yes Tat, Alm, MD  Tiotropium Bromide-Olodaterol (STIOLTO RESPIMAT ) 2.5-2.5 MCG/ACT AERS Inhale 2 puffs into the lungs daily.   Yes [provider]  tirzepatide  (MOUNJARO ) 12.5 MG/0.5ML Pen Inject 12.5 mg into the skin once a week. 06/14/23  Yes Reardon, Benton PARAS, NP  torsemide  (DEMADEX ) 20 MG tablet Take 3 tablets (60 mg total) by mouth daily. Patient taking differently: Take 60-80 mg by mouth daily. 02/25/24  Yes Tat, Alm, MD  traMADol  (ULTRAM ) 50 MG tablet Take 50 mg by mouth 3 (three) times daily as needed for severe pain (pain score 7-10).   Yes [provider]  TRESIBA  FLEXTOUCH 100 UNIT/ML FlexTouch Pen Inject 15  Units into the skin at bedtime.   Yes [provider]  ursodiol  (ACTIGALL ) 300 MG capsule Take 300 mg by mouth 2 (two) times daily.   Yes [provider]  Accu-Chek FastClix Lancets MISC Use 1 lancet to check blood glucose four (4) times daily as directed by provider. 09/23/23   Therisa Benton PARAS, NP  Blood Glucose Calibration (ACCU-CHEK GUIDE CONTROL) LIQD See admin instructions. 07/02/20   [provider]  Continuous Glucose Receiver (FREESTYLE LIBRE 3 READER) DEVI USE TO MONITOR GLUCOSE CONTINUOUSLY AS DIRECTED 06/15/23   Therisa Benton PARAS, NP  Continuous Glucose Sensor (FREESTYLE LIBRE 3 PLUS SENSOR) MISC USE TO MONITOR GLUCOSE CONTINUOUSLY AS DIRECTED. CHANGE SENSOR EVERY 15 DAYS. 06/17/23   Therisa Benton PARAS, NP  EASY COMFORT PEN NEEDLES 31G X 5 MM MISC INJECT IN SULIN EVERY DAY AS DIRECTED 06/02/21   [provider]  glucose blood (ACCU-CHEK GUIDE TEST) test strip Use 1 test strip four(4) times daily to monitor blood glucose as directed by provider. 09/23/23   Therisa Benton PARAS, NP  Lancet Devices (EASY MINI EJECT LANCING DEVICE) MISC 4 (four) times daily. 07/02/20   [provider]  levothyroxine  (SYNTHROID ) 88 MCG tablet Take 88 mcg by mouth daily before breakfast.    [provider]  Skin Protectants, Misc. [facility] [id number]) SHEE Change pad daily or if soiled 10/21/23   Therisa Benton PARAS, NP    Physical Exam: Vitals:   03/08/24 1205 03/08/24 1230 03/08/24 1311 03/08/24 1600  BP: (!) 160/106 (!) 139/95 (!) 176/99   Pulse: (!) 118 (!) 123 (!) 115   Resp: 14 (!) 21 17   Temp:   98.4 F (36.9 C) (!) 97.5 F (36.4 C)  TempSrc:   Axillary Oral  SpO2:  92% 100%   Weight:   96.5 kg   Height:   5' 8 (1.727 m)     Physical Exam  Gen:- Awake Alert, in no acute distress *** HEENT:- [place].AT, No sclera icterus Nose- [place] 6L/,min Neck-Supple Neck,No JVD,.  Lungs-  CTAB , fair air movement bilaterally *** CV- S1, S2 normal, RRR Abd-  +ve  B.Sounds, Abd Soft, No tenderness,    Extremity/Skin:- No  edema,   good pedal pulses *** Psych-affect is appropriate, oriented x3 Neuro-no new focal deficits, no tremors  Data Reviewed: COVID flu and RSV negative - Admission ABG without CO2 retention, respiratory alkalosis noted - proBNP elevated  at greater than 35,000 on it was 12,000 on 02/21/2024 - Hgb 11.7 similar to priors, platelets 320 WBC 10.4 -- Sodium is 138, potassium 4.0, creatinine 1.17 improved from 1.3 about 12 days ago - LFTs remarkable for alk phos of 532 AST is only 40 and ALT is 21 T. bili up to 5.4 -- Chest x-ray consistent with cardiomegaly with mild pulmonary venous congestion and associated interstitial edema and moderate left-sided pleural effusion  Assessment and Plan: Acute on chronic HFrEF Presented with cardiomegaly, pulmonary edema, left pleural effusion, severely marked volume overload. Last 2D echo done on 11/17/2023 revealed LVEF 35 to 40% Continue diuresing Monitor strict I's and O's and daily weight Follow repeat transthoracic echocardiogram Consider cardiology consult in the morning.   Hypothyroidism Follow TSH Resume home levothyroxine    Type 2 diabetes with hyperglycemia Hemoglobin A1c 6.6 on 11/23/2023 Presented with serum glucose of 196 Heart healthy carb modified diet Insulin  coverage.   CKD 3B Renal function appears to be at baseline 1.26 with GFR of 41. Monitor urine output Repeat CMP in the morning.   Elevated liver chemistries Alkaline phosphatase vitamin D , AST 48, T. bili 2.3 Repeat CMP Avoid hepatotoxic agents   Hypervolemic hyponatremia Serum sodium 127 Continue diuresing Repeat BMP in the morning.   Non anion gap metabolic acidosis Serum bicarb 19, anion gap 15 Continue to monitor   GERD Resume home PPI   Hyperlipidemia Resume home Crestor    Obesity BMI 36 Recommend weight loss outpatient with regular physical activity and healthy dieting   History of DVT in  2017 Was taken off Eliquis  prior to admission due to history of GI bleed Continue subcu insulin  for DVT prophylaxis.   QTc prolongation. QTc 534 on admission 12 EKG Avoid QTc prolonging agents Optimize magnesium  and potassium levels.   Monitor on telemetry.   MGUS Outpatient follow-up with medical oncology.   Right upper lobe pulmonary nodule measuring 7 mm Outpatient follow-up.    --------------------  Acute on chronic HFrEF -Presented with cardiomegaly, pulmonary edema, left pleural effusion, severe volume overload. Last 2D echo done on 11/17/2023 revealed LVEF 35 to 40% -IV furosemide  40 mg BID, increased by cardiology to 80 mg IV TID on 02/18/24>>continue through 1/8 -strict I's and O's and daily weight -02/17/24 Echo--EF 25-30%, + WMA, severe decrease RVF, RVSP 46.4, small pericardial eff -appreciate cardiology--Not candidate for advanced heart failure strategies per workup within the last 6 months  -continue bisoprolol , spironolactone  -NEG 14 lbs,  d/c weight 224.8 lbs (102 kg)   Moderate left pleural effusion -thoracentesis on 02/18/24 with 100 mL fluid removed  -fluid appears transudative   PAF -rate controlled -continue bisoprolol  -CHA2DS2-VASc score is 6.  -Poor candidate for anticoagulation due to GIB and not a good candidate for Watchman device  - anticoagulation discontinued in November 2025 and shared decision made to hold off on any further efforts at anticoagulation    Hypothyroidism -02/16/24 TSH--6.110 -Resumed home levothyroxine    Type 2 diabetes with hyperglycemia -Hemoglobin A1c 6.6 on 11/23/2023 -continue lantus  10 units daily during hospitalization -continue novolog  sliding scale - CBGs controlled   CKD 3B -baseline creatinine 1.2-1.5 -serum creatinine 1.31 on day of d/c   Advanced hepatic fibrosis Chronic hepatic fibrosis With chronic mildly abnormal LFTs, monitor GI recommended to continue lactulose , if patient refuses and recurrent AMS then  start Xifaxan  550 mg BID So far, pt has been taking the lactulose     Elevated liver chemistries Alkaline phosphatase vitamin D , AST 48, T. bili 2.3 Secondary to  chronic hepatic congestion from bi-ventricular HF Hepatic function panel stable    Hypervolemic hyponatremia Serum sodium 127>>131>>134 Continue diuresing>>improved   Non anion gap metabolic acidosis (NAGMA) Serum bicarb 19, anion gap 15 -improved with diuresis   GERD Resume home PPI   Hyperlipidemia Resume home Crestor    Obesity, class 2 BMI 36 initially Recommend weight loss outpatient with regular physical activity and healthy dieting   History of DVT in 2017 Was taken off Eliquis  prior to admission due to history of GI bleed Continue subcu insulin  for DVT prophylaxis.   QTc prolongation. QTc 534 on admission 12 EKG Avoid QTc prolonging agents Optimize magnesium  and potassium levels.   Monitor on telemetry.   MGUS Outpatient follow-up with medical oncology.   Right upper lobe pulmonary nodule measuring 7 mm Outpatient follow-up.       Advance Care Planning:   Code Status: Full Code ***  Consults: ***  Family Communication: ***  Severity of Illness: {Observation/Inpatient:21159}  Author: Rendall Carwin, MD 03/08/2024 7:21 PM  For on call review [email or web address].     [1]  Allergies Allergen Reactions   Tetracyclines & Related Anaphylaxis, Dermatitis and Rash   Banana Hives and Nausea And Vomiting   Jardiance  [Empagliflozin ] Other (See Comments)    Yeast infections, fatigue   Penicillins Rash   "

## 2024-03-08 NOTE — ED Notes (Signed)
 ED Provider at bedside.

## 2024-03-08 NOTE — ED Notes (Signed)
 Unable to obtain O2 pulse oximetry. MD aware, pt in no distress, currently speaking on the phone.

## 2024-03-08 NOTE — Telephone Encounter (Signed)
 Patient wants to get orders to get a hospital bed, bedside table and chair for her room.  Patient stated the company is [employer] as they also provide her oxygen .

## 2024-03-08 NOTE — Telephone Encounter (Signed)
 Noted.

## 2024-03-09 DIAGNOSIS — I480 Paroxysmal atrial fibrillation: Secondary | ICD-10-CM

## 2024-03-09 DIAGNOSIS — E1169 Type 2 diabetes mellitus with other specified complication: Secondary | ICD-10-CM | POA: Diagnosis not present

## 2024-03-09 DIAGNOSIS — E039 Hypothyroidism, unspecified: Secondary | ICD-10-CM

## 2024-03-09 DIAGNOSIS — I5023 Acute on chronic systolic (congestive) heart failure: Secondary | ICD-10-CM | POA: Diagnosis not present

## 2024-03-09 DIAGNOSIS — E785 Hyperlipidemia, unspecified: Secondary | ICD-10-CM

## 2024-03-09 LAB — BASIC METABOLIC PANEL WITH GFR
Anion gap: 12 (ref 5–15)
BUN: 38 mg/dL — ABNORMAL HIGH (ref 8–23)
CO2: 25 mmol/L (ref 22–32)
Calcium: 8.6 mg/dL — ABNORMAL LOW (ref 8.9–10.3)
Chloride: 100 mmol/L (ref 98–111)
Creatinine, Ser: 1.34 mg/dL — ABNORMAL HIGH (ref 0.44–1.00)
GFR, Estimated: 42 mL/min — ABNORMAL LOW
Glucose, Bld: 178 mg/dL — ABNORMAL HIGH (ref 70–99)
Potassium: 3.6 mmol/L (ref 3.5–5.1)
Sodium: 136 mmol/L (ref 135–145)

## 2024-03-09 LAB — GLUCOSE, CAPILLARY
Glucose-Capillary: 168 mg/dL — ABNORMAL HIGH (ref 70–99)
Glucose-Capillary: 161 mg/dL — ABNORMAL HIGH (ref 70–99)
Glucose-Capillary: 125 mg/dL — ABNORMAL HIGH (ref 70–99)
Glucose-Capillary: 164 mg/dL — ABNORMAL HIGH (ref 70–99)

## 2024-03-09 NOTE — TOC Initial Note (Signed)
 Transition of Care [facility]) - Initial/Assessment Note    Patient Details  Name: Stephanie [name] MRN: [id number] Date of Birth: Nov 25, 1950  [facility]) CM/SW Contact:    Mcarthur Saddie Kim, LCSW Phone Number: 03/09/2024, 1:20 PM  Clinical Narrative:  Assessment completed due to high risk readmission score. Pt known to TOC from recent admission. She lives with her daughter. Pt has RW, wheelchair, and home O2 ([employer]). Pt d/c home with [facility] after last admission. Per pt, they came to see her but haven't been back. Sarah with [facility] indicates they tried multiple times to schedule with pt but were unable due to pt's schedule. They are unable to accept pt back. LCSW discussed with pt who requests new home health agency. Referrals sent out. TOC will follow.                  Expected Discharge Plan: Home w Home Health Services Barriers to Discharge: Continued Medical Work up   Patient Goals and CMS Choice Patient states their goals for this hospitalization and ongoing recovery are:: return home   Choice offered to / list presented to : Patient [place] ownership interest in [facility].provided to::  (n/a)    Expected Discharge Plan and Services In-house Referral: Clinical Social Work     Living arrangements for the past 2 months: Single Family Home                             HH Agency: Other - See comment [employer], [employer]/film/video) Date HH Agency Contacted: 03/09/24 Time HH Agency Contacted: 1029 Representative spoke with at [facility] Agency: Sarah  Prior Living Arrangements/Services Living arrangements for the past 2 months: Single Family Home Lives with:: Adult Children Patient language and need for interpreter reviewed:: Yes Do you feel safe going back to the place where you live?: Yes      Need for Family Participation in Patient Care: No (Comment) Care giver support system in place?: Yes (comment) Current home services: DME (walker, wheelchair, BSC,  O2) Criminal Activity/Legal Involvement Pertinent to Current Situation/Hospitalization: No - Comment as needed  Activities of Daily Living   ADL Screening (condition at time of admission) Independently performs ADLs?: No (uses walker and requires assistance to stand) Does the patient have a NEW difficulty with bathing/dressing/toileting/self-feeding that is expected to last >3 days?: No Does the patient have a NEW difficulty with getting in/out of bed, walking, or climbing stairs that is expected to last >3 days?: No Does the patient have a NEW difficulty with communication that is expected to last >3 days?: No Is the patient deaf or have difficulty hearing?: No Does the patient have difficulty seeing, even when wearing glasses/contacts?: No Does the patient have difficulty concentrating, remembering, or making decisions?: No  Permission Sought/Granted                  Emotional Assessment     Affect (typically observed): Appropriate Orientation: : Oriented to Self, Oriented to Place, Oriented to  Time, Oriented to Situation Alcohol / Substance Use: Not Applicable Psych Involvement: No (comment)  Admission diagnosis:  Acute exacerbation of CHF (congestive heart failure) (HCC) [I50.9] Patient Active Problem List   Diagnosis Date Noted   Acute exacerbation of CHF (congestive heart failure) (HCC) 03/08/2024   PAF (paroxysmal atrial fibrillation) (HCC) 02/25/2024   Biventricular failure (HCC) 02/18/2024   H/O: GI bleed 02/18/2024   Acute on chronic systolic (congestive)  heart failure (HCC) 02/16/2024   Non-cardiac chest pain 12/27/2023   Acute blood loss anemia 12/27/2023   Chronic combined systolic and diastolic heart failure (HCC) 12/27/2023   Increased ammonia level 12/24/2023   Confusion 12/24/2023   Heme positive stool 12/24/2023   Encephalopathy, hepatic (HCC) 12/22/2023   GIB (gastrointestinal bleeding) 12/21/2023   Altered mental status, unspecified 12/21/2023    Depression 12/04/2023   History of CVA (cerebrovascular accident) 12/01/2023   COVID-19 01/09/2023   Right-sided chest pain 01/08/2023   Elevated troponin 01/08/2023   Chronic respiratory failure with hypoxia (HCC) 01/08/2023   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   Anxiety 12/23/2021   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) 11/25/2021   Hyperlipidemia 11/25/2021   Anemia due to chronic kidney disease 11/25/2021   Bilateral primary osteoarthritis of knee 09/15/2021   Acute on chronic combined systolic and diastolic CHF (congestive heart failure) (HCC) 05/20/2021   At risk for obstructive sleep apnea 05/20/2021   Protein-calorie malnutrition, moderate 05/17/2021   Cerebral infarction, unspecified (HCC) 05/17/2021   NASH (nonalcoholic steatohepatitis) 01/21/2021   Fibromyalgia 07/14/2020   Ginther (dyspnea on exertion) 06/18/2020   Chronic pain syndrome 04/10/2020   Lab test positive for detection of COVID-19 virus 03/17/2020   Pneumonia due to COVID-19 virus 03/11/2020   Acute respiratory disease due to COVID-19 virus 03/11/2020   Long term (current) use of anticoagulants 08/16/2018   TIA (transient ischemic attack) 09/15/2017   Localized osteoarthritis of right shoulder 08/08/2017   Acute embolism and thrombosis of unspecified deep veins of lower extremity, bilateral (HCC) 03/16/2017   Atrial fibrillation with RVR (HCC) 03/16/2017   Chronic kidney disease, stage 3b (HCC) 03/16/2017   Dysphagia 12/16/2015   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   Hyperglycemia due to type 2 diabetes mellitus (HCC) 12/09/2015   History of thromboembolism 11/08/2015   Constipation 10/27/2015   Elevated alkaline phosphatase level 07/23/2015   Alkaline phosphatase raised 07/23/2015   Hypoglycemia 07/01/2015   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   Maculopapular rash, generalized 06/27/2015   Alopecia 06/27/2015   Hypothyroidism  06/27/2015   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) 06/27/2015   Diverticulitis 10/19/2013   Former cigarette smoker 10/19/2013   Obesity 10/19/2013   Type 2 diabetes mellitus with hyperlipidemia (HCC) 10/19/2013   Hypertension    Symptomatic anemia    GERD (gastroesophageal reflux disease)    COPD (chronic obstructive pulmonary disease) (HCC)    PCP:  [facility], [facility] Pharmacy:   [id number] - [place], [place] - [address] AT SEC [address] [place] [address] Phone: [phone] Fax: [phone]     Social Drivers of Health (SDOH) Social History: SDOH Screenings   Food Insecurity: No Food Insecurity (03/08/2024)  Housing: Unknown (03/08/2024)  Transportation Needs: No Transportation Needs (03/08/2024)  Utilities: Not At Risk (03/08/2024)  Depression (PHQ2-9): Low Risk (09/14/2023)  Social Connections: Unknown (03/08/2024)  Recent Concern: Social Connections - Moderately Isolated (02/16/2024)  Tobacco Use: Medium Risk (03/08/2024)   SDOH Interventions:     Readmission Risk Interventions    03/09/2024   10:25 AM 02/17/2024   11:46 AM 12/27/2023   10:08 AM  Readmission Risk Prevention Plan  Transportation Screening Complete Complete Complete  PCP or Specialist Appt within 3-5 Days  Complete   HRI or Home Care Consult  Complete   Social Work Consult for Recovery Care  Planning/Counseling  Complete   Palliative Care Screening  Not Applicable   Medication Review (RN Care Manager) Complete Complete Complete  PCP or Specialist appointment within 3-5 days of discharge   Complete  HRI or Home Care Consult Complete  Complete  SW Recovery Care/Counseling Consult Complete  Complete  Palliative Care Screening Not Applicable  Complete  Skilled Nursing Facility Not Applicable  Complete

## 2024-03-09 NOTE — Plan of Care (Signed)
" °  Problem: Clinical Measurements: Goal: Ability to maintain clinical measurements within normal limits will improve Outcome: Progressing Goal: Will remain free from infection Outcome: Progressing Goal: Diagnostic test results will improve Outcome: Progressing Goal: Respiratory complications will improve Outcome: Progressing Goal: Cardiovascular complication will be avoided Outcome: Progressing   Problem: Activity: Goal: Risk for activity intolerance will decrease Outcome: Not Progressing   Problem: Skin Integrity: Goal: Risk for impaired skin integrity will decrease Outcome: Not Progressing   "

## 2024-03-09 NOTE — Progress Notes (Signed)
 " PROGRESS NOTE   Stephanie [name], is a 74 y.o. female, DOB - 07/03/1950, FMW:[id number]  Admit date - 03/08/2024   Admitting Physician Narvel Kozub Pearlean, MD  Outpatient Primary MD for the patient is [facility]  LOS - 1  Chief Complaint  Patient presents with   Shortness of Breath       Brief Narrative:   74 y.o. female with medical history significant for  MGUS, chronic HFrEF (EF 25 to 30 %),  history of DVT (was taken off Eliquis  prior to admission due to history of GI bleed), CVA, CKD 3B, COPD/asthma, chronic hypoxia on 3-4 L [place] at baseline, fibromyalgia, GERD, type 2 diabetes, hypertension, hyperlipidemia, hypothyroidism, obesity readmitted on 03/08/2024 after being recently discharged on 02/25/2024 with congestive heart failure and pleural effusion concerns    -Assessment and Plan: 1)Acute on chronic HFrEF/systolic dysfunction exacerbation Clinical exam and imaging studies and labs consistent with CHF exacerbation. --patient was recently admitted from 02/16/2024 to 02/25/2024 and treated for CHF exacerbation with finding of left-sided pleural effusion she underwent thoracentesis on 02/18/2024 Last 2D echo done on 11/17/2023 revealed LVEF 35 to 40% - Continue IV Lasix , -Continue Entresto  and Aldactone  -Fluid input and output monitoring and daily weights--weight scale appears to be inaccurate at this time -- Repeat chest x-ray on 03/10/2024 consider thoracentesis  2)Paroxysmal A-fib--- continue bisoprolol  for rate control  - Patient was previously taken off  anticoagulation due to GI bleed  3)DM2 -Hemoglobin A1c 6.6 on 11/23/2023 reflecting good diabetic control PTA Use Novolog /Humalog  Sliding scale insulin  with Accu-Cheks/Fingersticks as ordered    CKD 3B  creatinine 1.17 improved from 1.3 about 12 days ago - Monitor renal function closely with diuresis - renally adjust medications, avoid nephrotoxic agents / dehydration  / hypotension  GERD -Continue Protonix     Hyperlipidemia Resume home Crestor   Hypothyroidism C/n  levothyroxine   Class1 Obesity- -Low calorie diet, portion control and increase physical activity discussed with patient after resolution of acute illness -Body mass index is 34.19 kg/m.  History of DVT in 2017 Was taken off Eliquis  prior to admission due to history of GI bleed Continue subcu insulin  for DVT prophylaxis.   MGUS Outpatient follow-up with medical oncology.   Right upper lobe pulmonary nodule measuring 7 mm Outpatient follow-up.  Advanced hepatic fibrosis Chronic hepatic fibrosis -Continue lactulose   Status is: Inpatient   Disposition: The patient is from: Home              Anticipated d/c is to: Home              Anticipated d/c date is: 2 days              Patient currently is not medically stable to d/c. Barriers: Not Clinically Stable-   Code Status :  -  Code Status: Full Code   Family Communication:    NA (patient is alert, awake and coherent)   DVT Prophylaxis  :   - SCDs   heparin  injection 5,000 Units Start: 03/08/24 2200 SCDs Start: 03/08/24 1921 Place TED hose Start: 03/08/24 1921   Lab Results  Component Value Date   PLT 320 03/08/2024    Inpatient Medications  Scheduled Meds:  arformoterol   15 mcg Nebulization BID   And   umeclidinium bromide   1 puff Inhalation Daily   bisoprolol   5 mg Oral Daily   Chlorhexidine  Gluconate Cloth  6 each Topical Q0600   cycloSPORINE   1 drop Both Eyes BID   DULoxetine   60 mg Oral BID   furosemide   60 mg Intravenous Q12H   heparin   5,000 Units Subcutaneous Q8H   insulin  aspart  0-15 Units Subcutaneous TID WC   insulin  aspart  0-5 Units Subcutaneous QHS   insulin  glargine  10 Units Subcutaneous QHS   lactulose   20 g Oral BID   levothyroxine   100 mcg Oral QAC breakfast   pantoprazole   40 mg Oral BID   rosuvastatin   5 mg Oral Daily   sacubitril -valsartan   1 tablet Oral BID   sodium chloride  flush  3 mL Intravenous Q12H   sodium chloride   flush  3 mL Intravenous Q12H   spironolactone   25 mg Oral Daily   ursodiol   300 mg Oral BID   Continuous Infusions:  sodium chloride      PRN Meds:.sodium chloride , acetaminophen  **OR** acetaminophen , albuterol , bisacodyl , menthol , ondansetron  **OR** ondansetron  (ZOFRAN ) IV, phenol, polyethylene glycol, sodium chloride  flush, traZODone    Anti-infectives (From admission, onward)    None         Subjective: Stephanie [name] today has no fevers, no emesis,  No chest pain,    Dyspnea improving - Voiding well   Objective: Vitals:   03/09/24 0742 03/09/24 0748 03/09/24 0754 03/09/24 1500  BP: (!) 149/84     Pulse: 94     Resp: 14     Temp: 98.1 F (36.7 C) 98.4 F (36.9 C)  (!) 97.5 F (36.4 C)  TempSrc: Oral Oral  Axillary  SpO2: 100%  96%   Weight:      Height:        Intake/Output Summary (Last 24 hours) at 03/09/2024 1836 Last data filed at 03/08/2024 2147 Gross per 24 hour  Intake --  Output 300 ml  Net -300 ml   Filed Weights   03/08/24 1038 03/08/24 1311 03/09/24 0509  Weight: 102 kg 96.5 kg 102 kg    Physical Exam  Gen:- Awake Alert, no conversational dyspnea HEENT:- [place].AT, No sclera icterus Nose- [place] 4L/min Neck-Supple Neck, +ve JVD,.  Lungs-diminished breath sounds, no significant wheezing  CV- S1, S2 normal, regular  Abd-  +ve B.Sounds, Abd Soft, No tenderness,    Extremity/Skin:- No  edema, pedal pulses present  Psych-affect is appropriate, oriented x3 Neuro-no new focal deficits, no tremors  Data Reviewed: I have personally reviewed following labs and imaging studies  CBC: Recent Labs  Lab 03/08/24 1047  WBC 10.4  HGB 11.7*  HCT 38.6  MCV 81.6  PLT 320   Basic Metabolic Panel: Recent Labs  Lab 03/08/24 1047 03/09/24 0500  NA 138 136  K 4.0 3.6  CL 99 100  CO2 23 25  GLUCOSE 190* 178*  BUN 38* 38*  CREATININE 1.17* 1.34*  CALCIUM  9.0 8.6*   GFR: Estimated Creatinine Clearance: 46.7 mL/min (A) (by C-G formula based on SCr of 1.34  mg/dL (H)). Liver Function Tests: Recent Labs  Lab 03/08/24 1047  AST 40  ALT 21  ALKPHOS 532*  BILITOT 5.4*  PROT 9.0*  ALBUMIN  3.2*   BNP (last 3 results) Recent Labs    02/20/24 0554 02/21/24 0539 03/08/24 1047  PROBNP 12,077.0* 12,078.0* >35,000.0*   Recent Results (from the past 240 hours)  Resp panel by RT-PCR (RSV, Flu A&B, Covid) Anterior Nasal Swab     Status: None   Collection Time: 03/08/24 11:56 AM   Specimen: Anterior Nasal Swab  Result Value Ref Range Status   SARS Coronavirus 2 by RT PCR NEGATIVE NEGATIVE Final    Comment: (  NOTE) SARS-CoV-2 target nucleic acids are NOT DETECTED.  The SARS-CoV-2 RNA is generally detectable in upper respiratory specimens during the acute phase of infection. The lowest concentration of SARS-CoV-2 viral copies this assay can detect is 138 copies/mL. A negative result does not preclude SARS-Cov-2 infection and should not be used as the sole basis for treatment or other patient management decisions. A negative result may occur with  improper specimen collection/handling, submission of specimen other than nasopharyngeal swab, presence of viral mutation(s) within the areas targeted by this assay, and inadequate number of viral copies(<138 copies/mL). A negative result must be combined with clinical observations, patient history, and epidemiological information. The expected result is Negative.  Fact Sheet for Patients:  bloggercourse.com  Fact Sheet for Healthcare Providers:  seriousbroker.it  This test is no t yet approved or cleared by the [place]  FDA and  has been authorized for detection and/or diagnosis of SARS-CoV-2 by FDA under an Emergency Use Authorization (EUA). This EUA will remain  in effect (meaning this test can be used) for the duration of the COVID-19 declaration under Section 564(b)(1) of the Act, 21 U.S.C.section 360bbb-3(b)(1), unless the  authorization is terminated  or revoked sooner.       Influenza A by PCR NEGATIVE NEGATIVE Final   Influenza B by PCR NEGATIVE NEGATIVE Final    Comment: (NOTE) The Xpert Xpress SARS-CoV-2/FLU/RSV plus assay is intended as an aid in the diagnosis of influenza from Nasopharyngeal swab specimens and should not be used as a sole basis for treatment. Nasal washings and aspirates are unacceptable for Xpert Xpress SARS-CoV-2/FLU/RSV testing.  Fact Sheet for Patients: bloggercourse.com  Fact Sheet for Healthcare Providers: seriousbroker.it  This test is not yet approved or cleared by the [place]  FDA and has been authorized for detection and/or diagnosis of SARS-CoV-2 by FDA under an Emergency Use Authorization (EUA). This EUA will remain in effect (meaning this test can be used) for the duration of the COVID-19 declaration under Section 564(b)(1) of the Act, 21 U.S.C. section 360bbb-3(b)(1), unless the authorization is terminated or revoked.     Resp Syncytial Virus by PCR NEGATIVE NEGATIVE Final    Comment: (NOTE) Fact Sheet for Patients: bloggercourse.com  Fact Sheet for Healthcare Providers: seriousbroker.it  This test is not yet approved or cleared by the [place]  FDA and has been authorized for detection and/or diagnosis of SARS-CoV-2 by FDA under an Emergency Use Authorization (EUA). This EUA will remain in effect (meaning this test can be used) for the duration of the COVID-19 declaration under Section 564(b)(1) of the Act, 21 U.S.C. section 360bbb-3(b)(1), unless the authorization is terminated or revoked.  Performed at [facility], [address], [place], [place] [address]   MRSA Next Gen by PCR, Nasal     Status: None   Collection Time: 03/08/24  1:10 PM   Specimen: Nasal Mucosa; Nasal Swab  Result Value Ref Range Status   MRSA by PCR Next Gen NOT  DETECTED NOT DETECTED Final    Comment: (NOTE) The GeneXpert MRSA Assay (FDA approved for NASAL specimens only), is one component of a comprehensive MRSA colonization surveillance program. It is not intended to diagnose MRSA infection nor to guide or monitor treatment for MRSA infections. Test performance is not FDA approved in patients less than 64 years old. Performed at [facility], [address], [place], [place] [address]       Radiology Studies: [facility] 1 View Result Date: 03/08/2024 CLINICAL DATA:  Shortness of breath.  EXAM: PORTABLE CHEST 1 VIEW COMPARISON:  February 20, 2024 FINDINGS: The cardiac silhouette is moderately enlarged and is unchanged in size. There is mild prominence of the central pulmonary vasculature. Mild, diffuse, increased interstitial lung markings are present. Mild to moderate severity left basilar atelectasis and/or infiltrate is seen. A moderate size left-sided pleural effusion is noted. No pneumothorax is identified. Multilevel degenerative changes are present throughout the thoracic spine. IMPRESSION: 1. Cardiomegaly with mild pulmonary vascular congestion and associated interstitial edema. 2. Mild to moderate severity left basilar atelectasis and/or infiltrate. 3. Moderate size left-sided pleural effusion. Electronically Signed   By: Suzen Dials M.D.   On: 03/08/2024 12:10   Scheduled Meds:  arformoterol   15 mcg Nebulization BID   And   umeclidinium bromide   1 puff Inhalation Daily   bisoprolol   5 mg Oral Daily   Chlorhexidine  Gluconate Cloth  6 each Topical Q0600   cycloSPORINE   1 drop Both Eyes BID   DULoxetine   60 mg Oral BID   furosemide   60 mg Intravenous Q12H   heparin   5,000 Units Subcutaneous Q8H   insulin  aspart  0-15 Units Subcutaneous TID WC   insulin  aspart  0-5 Units Subcutaneous QHS   insulin  glargine  10 Units Subcutaneous QHS   lactulose   20 g Oral BID   levothyroxine   100 mcg Oral QAC breakfast   pantoprazole   40 mg Oral  BID   rosuvastatin   5 mg Oral Daily   sacubitril -valsartan   1 tablet Oral BID   sodium chloride  flush  3 mL Intravenous Q12H   sodium chloride  flush  3 mL Intravenous Q12H   spironolactone   25 mg Oral Daily   ursodiol   300 mg Oral BID   Continuous Infusions:  sodium chloride        LOS: 1 day   Rendall Carwin M.D on 03/09/2024 at 6:36 PM  Go to [email or web address] - for contact info  [facility] - Office  [phone]  If 7PM-7AM, please contact night-coverage [email or web address] 03/09/2024, 6:36 PM    "

## 2024-03-09 NOTE — Plan of Care (Signed)
" °  Problem: Education: Goal: Knowledge of General Education information will improve Description: Including pain rating scale, medication(s)/side effects and non-pharmacologic comfort measures Outcome: Progressing   Problem: Health Behavior/Discharge Planning: Goal: Ability to manage health-related needs will improve Outcome: Progressing   Problem: Clinical Measurements: Goal: Ability to maintain clinical measurements within normal limits will improve Outcome: Progressing Goal: Will remain free from infection Outcome: Progressing Goal: Diagnostic test results will improve Outcome: Progressing Goal: Respiratory complications will improve Outcome: Progressing Goal: Cardiovascular complication will be avoided Outcome: Progressing   Problem: Activity: Goal: Risk for activity intolerance will decrease Outcome: Progressing   Problem: Elimination: Goal: Will not experience complications related to bowel motility Outcome: Progressing Goal: Will not experience complications related to urinary retention Outcome: Progressing   Problem: Safety: Goal: Ability to remain free from injury will improve Outcome: Progressing   Problem: Skin Integrity: Goal: Risk for impaired skin integrity will decrease Outcome: Progressing   Problem: Education: Goal: Ability to describe self-care measures that may prevent or decrease complications (Diabetes Survival Skills Education) will improve Outcome: Progressing Goal: Individualized Educational Video(s) Outcome: Progressing   Problem: Coping: Goal: Ability to adjust to condition or change in health will improve Outcome: Progressing   Problem: Fluid Volume: Goal: Ability to maintain a balanced intake and output will improve Outcome: Progressing   Problem: Health Behavior/Discharge Planning: Goal: Ability to identify and utilize available resources and services will improve Outcome: Progressing Goal: Ability to manage health-related needs will  improve Outcome: Progressing   Problem: Metabolic: Goal: Ability to maintain appropriate glucose levels will improve Outcome: Progressing   Problem: Nutritional: Goal: Maintenance of adequate nutrition will improve Outcome: Progressing Goal: Progress toward achieving an optimal weight will improve Outcome: Progressing   Problem: Skin Integrity: Goal: Risk for impaired skin integrity will decrease Outcome: Progressing   Problem: Tissue Perfusion: Goal: Adequacy of tissue perfusion will improve Outcome: Progressing   "

## 2024-03-10 ENCOUNTER — Inpatient Hospital Stay (HOSPITAL_COMMUNITY)

## 2024-03-10 DIAGNOSIS — J9611 Chronic respiratory failure with hypoxia: Secondary | ICD-10-CM | POA: Diagnosis not present

## 2024-03-10 DIAGNOSIS — D472 Monoclonal gammopathy: Secondary | ICD-10-CM | POA: Diagnosis not present

## 2024-03-10 DIAGNOSIS — I5023 Acute on chronic systolic (congestive) heart failure: Secondary | ICD-10-CM | POA: Diagnosis not present

## 2024-03-10 DIAGNOSIS — I480 Paroxysmal atrial fibrillation: Secondary | ICD-10-CM | POA: Diagnosis not present

## 2024-03-10 LAB — BODY FLUID CELL COUNT WITH DIFFERENTIAL
Eos, Fluid: 0 %
Lymphs, Fluid: 52 %
Monocyte-Macrophage-Serous Fluid: 15 % — ABNORMAL LOW (ref 50–90)
Neutrophil Count, Fluid: 33 % — ABNORMAL HIGH (ref 0–25)
Total Nucleated Cell Count, Fluid: 107 uL (ref 0–1000)

## 2024-03-10 LAB — GLUCOSE, CAPILLARY
Glucose-Capillary: 98 mg/dL (ref 70–99)
Glucose-Capillary: 135 mg/dL — ABNORMAL HIGH (ref 70–99)
Glucose-Capillary: 148 mg/dL — ABNORMAL HIGH (ref 70–99)
Glucose-Capillary: 156 mg/dL — ABNORMAL HIGH (ref 70–99)

## 2024-03-10 LAB — LACTATE DEHYDROGENASE: LDH: 238 U/L — ABNORMAL HIGH (ref 105–235)

## 2024-03-10 MED ORDER — FUROSEMIDE 10 MG/ML IJ SOLN
60.0000 mg | Freq: Every morning | INTRAMUSCULAR | Status: DC
  Administered 2024-03-11: 60 mg via INTRAVENOUS
  Filled 2024-03-10: qty 6

## 2024-03-10 MED ORDER — LIDOCAINE HCL (PF) 1 % IJ SOLN
INTRAMUSCULAR | Status: AC
  Administered 2024-03-10: 10 mL
  Filled 2024-03-10: qty 5

## 2024-03-10 MED ORDER — LIDOCAINE HCL (PF) 1 % IJ SOLN: 10.0000 mL | Freq: Once | INTRAMUSCULAR | Status: AC

## 2024-03-10 MED ORDER — LIDOCAINE HCL (PF) 1 % IJ SOLN
INTRAMUSCULAR | Status: AC
  Filled 2024-03-10: qty 5

## 2024-03-10 NOTE — Progress Notes (Signed)
 [facility] Liaison Note:  This patient is currently enrolled in [facility] outpatient-based palliative care.   Please call for any outpatient based palliative care related questions or concerns.  Thank you, Greig Basket, BSN, RN [facility] Liaison [phone]

## 2024-03-10 NOTE — Care Management Important Message (Signed)
 Important Message  Patient Details  Name: Stephanie [name] MRN: [id number] Date of Birth: [birth year]   Important Message Given:  Yes - Medicare IM     Torian Quintero L Dameon Soltis 03/10/2024, 4:25 PM

## 2024-03-10 NOTE — Procedures (Signed)
 PROCEDURE SUMMARY:  Successful image-guided left thoracentesis. Yielded 65 milliliters of clear yellow fluid. Despite multiple catheter manipulations there was no further flow of fluid even with small residual effusion seen on ultrasound and the procedure was aborted. Patient tolerated procedure well. EBL < 1 mL. No immediate complications.  Specimen was sent for labs. Post procedure CXR shows no pneumothorax.  Please see imaging section of Epic for full dictation.  Clotilda DELENA Hesselbach PA-C 03/10/2024 11:48 AM

## 2024-03-10 NOTE — Progress Notes (Signed)
 " PROGRESS NOTE   Stephanie [name], is a 74 y.o. female, DOB - [birth year], FMW:[id number]  Admit date - 03/08/2024   Admitting Physician Milbern Doescher Pearlean, MD  Outpatient Primary MD for the patient is [facility]  LOS - 2  Chief Complaint  Patient presents with   Shortness of Breath       Brief Narrative:   74 y.o. 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 --patient was recently admitted from 02/16/2024 to 02/25/2024 and treated for CHF exacerbation with finding of left-sided pleural effusion she underwent thoracentesis on 02/18/2024 Last 2D echo done on 11/17/2023 revealed LVEF 35 to 40% -Dyspnea improving - Voiding well - Hypoxia on room - Continue IV Lasix , -Continue Entresto  and Aldactone  -Fluid input and output monitoring and daily weights--weight scale appears to be inaccurate at this time -- Repeat chest x-ray on 03/10/2024 after attempted/mostly unsuccessful thoracentesis noted  2)Paroxysmal A-fib--- continue bisoprolol  for rate control  - Patient was previously taken off  anticoagulation due to GI bleed  3)Lt Sided Pleural Effusion--- -- Mostly unsuccessful attempt left-sided thoracentesis on 03/10/2024 only 65 mL of pleural fluid removed --Fluid analysis pending- - 4)DM2 -Hemoglobin A1c 6.6 on 11/23/2023 reflecting good diabetic control PTA Use Novolog /Humalog  Sliding scale insulin  with Accu-Cheks/Fingersticks  as ordered    5)CKD 3B  creatinine 1.17 improved from 1.3 about 12 days ago - Monitor renal function closely with diuresis - renally adjust medications, avoid nephrotoxic agents / dehydration  / hypotension  GERD -Continue Protonix    Hyperlipidemia Resume home Crestor   Hypothyroidism C/n  levothyroxine   Class1 Obesity- -Low calorie diet, portion control and increase physical activity discussed with patient after resolution of acute illness -Body mass index is 34.19 kg/m.  History of DVT in 2017 Was taken off Eliquis  prior to admission due to history of GI bleed Continue subcu insulin  for DVT prophylaxis.   MGUS Outpatient follow-up with medical oncology.   Right upper lobe pulmonary nodule measuring 7 mm Outpatient follow-up.  Advanced hepatic fibrosis Chronic hepatic fibrosis -Continue lactulose   Status is: Inpatient   Disposition: The patient is from: Home              Anticipated d/c is to: Home              Anticipated d/c date is: 1 day              Patient currently is not medically stable to d/c. Barriers: Not Clinically Stable-   Code Status :  -  Code Status: Full Code   Family Communication:    NA (patient is alert, awake and coherent)   DVT Prophylaxis  :   - SCDs   heparin  injection 5,000 Units Start: 03/08/24 2200 SCDs Start: 03/08/24 1921 Place TED hose Start: 03/08/24 1921   Lab Results  Component Value Date   PLT 320 03/08/2024    Inpatient Medications  Scheduled Meds:  arformoterol   15 mcg Nebulization BID   And   umeclidinium bromide   1 puff Inhalation Daily   bisoprolol   5 mg Oral Daily   Chlorhexidine  Gluconate Cloth  6 each Topical Q0600   cycloSPORINE   1 drop Both Eyes BID   DULoxetine   60 mg Oral BID   furosemide   60 mg Intravenous Q12H   heparin   5,000 Units Subcutaneous Q8H   insulin  aspart  0-15 Units Subcutaneous TID WC   insulin  aspart  0-5 Units Subcutaneous QHS   insulin  glargine  10 Units Subcutaneous QHS    lactulose   20 g Oral BID   levothyroxine   100 mcg Oral QAC breakfast   pantoprazole   40 mg Oral BID   rosuvastatin   5 mg Oral Daily   sacubitril -valsartan   1 tablet Oral BID   sodium chloride  flush  3 mL Intravenous Q12H   sodium chloride  flush  3 mL Intravenous Q12H   spironolactone   25 mg Oral Daily   ursodiol   300 mg Oral BID   Continuous Infusions:   PRN Meds:.acetaminophen  **OR** acetaminophen , albuterol , bisacodyl , menthol , ondansetron  **OR** ondansetron  (ZOFRAN ) IV, phenol, polyethylene glycol, sodium chloride  flush, traZODone    Anti-infectives (From admission, onward)    None         Subjective: Stephanie [name] today has no fevers, no emesis,  No chest pain,    Dyspnea improving - Voiding well - Hypoxia on room -- Mostly unsuccessful attempt left-sided thoracentesis on 03/10/2024 only 65 mL of pleural fluid removed   Objective: Vitals:   03/10/24 1500 03/10/24 1547 03/10/24 1600 03/10/24 1700  BP: (!) 148/83  130/71 116/64  Pulse: 73  78 80  Resp: 19  (!) 21 19  Temp:  (!) 97.4 F (36.3 C)    TempSrc:  Oral    SpO2: 100%  100% 97%  Weight:      Height:        Intake/Output Summary (Last 24 hours) at 03/10/2024 1808 Last data filed at 03/10/2024 0400 Gross per 24 hour  Intake 90 ml  Output 850 ml  Net -760 ml   Filed Weights   03/08/24 1311 03/09/24 0509 03/10/24 0454  Weight: 96.5 kg 102 kg 98.7 kg    Physical Exam  Gen:- Awake Alert, no conversational dyspnea HEENT:- [place].AT, No sclera icterus Nose- [place] 2L/min Neck-Supple Neck, +ve JVD,.  Lungs-diminished breath sounds, especially on the left, no significant wheezing  CV- S1, S2 normal, regular  Abd-  +ve B.Sounds, Abd Soft, No tenderness,    Extremity/Skin:- No significant edema, pedal pulses present  Psych-affect is appropriate, oriented x3 Neuro-no new focal deficits, no tremors  Data Reviewed: I have personally reviewed following labs and imaging studies  CBC: Recent Labs  Lab  03/08/24 1047  WBC 10.4  HGB 11.7*  HCT 38.6  MCV 81.6  PLT 320   Basic Metabolic Panel: Recent Labs  Lab 03/08/24 1047 03/09/24 0500  NA 138 136  K 4.0 3.6  CL 99 100  CO2 23 25  GLUCOSE 190* 178*  BUN 38* 38*  CREATININE 1.17* 1.34*  CALCIUM  9.0 8.6*   GFR: Estimated Creatinine Clearance: 45.9 mL/min (A) (by C-G formula based on SCr of 1.34 mg/dL (H)). Liver Function Tests: Recent Labs  Lab 03/08/24 1047  AST 40  ALT 21  ALKPHOS 532*  BILITOT 5.4*  PROT 9.0*  ALBUMIN  3.2*   BNP (last 3 results) Recent Labs    02/20/24 0554 02/21/24 0539 03/08/24 1047  PROBNP 12,077.0* 12,078.0* >35,000.0*   Recent Results (from the past 240 hours)  Resp panel by RT-PCR (  RSV, Flu A&B, Covid) Anterior Nasal Swab     Status: None   Collection Time: 03/08/24 11:56 AM   Specimen: Anterior Nasal Swab  Result Value Ref Range Status   SARS Coronavirus 2 by RT PCR NEGATIVE NEGATIVE Final    Comment: (NOTE) SARS-CoV-2 target nucleic acids are NOT DETECTED.  The SARS-CoV-2 RNA is generally detectable in upper respiratory specimens during the acute phase of infection. The lowest concentration of SARS-CoV-2 viral copies this assay can detect is 138 copies/mL. A negative result does not preclude SARS-Cov-2 infection and should not be used as the sole basis for treatment or other patient management decisions. A negative result may occur with  improper specimen collection/handling, submission of specimen other than nasopharyngeal swab, presence of viral mutation(s) within the areas targeted by this assay, and inadequate number of viral copies(<138 copies/mL). A negative result must be combined with clinical observations, patient history, and epidemiological information. The expected result is Negative.  Fact Sheet for Patients:  bloggercourse.com  Fact Sheet for Healthcare Providers:  seriousbroker.it  This test is no t yet  approved or cleared by the [place]  FDA and  has been authorized for detection and/or diagnosis of SARS-CoV-2 by FDA under an Emergency Use Authorization (EUA). This EUA will remain  in effect (meaning this test can be used) for the duration of the COVID-19 declaration under Section 564(b)(1) of the Act, 21 U.S.C.section 360bbb-3(b)(1), unless the authorization is terminated  or revoked sooner.       Influenza A by PCR NEGATIVE NEGATIVE Final   Influenza B by PCR NEGATIVE NEGATIVE Final    Comment: (NOTE) The Xpert Xpress SARS-CoV-2/FLU/RSV plus assay is intended as an aid in the diagnosis of influenza from Nasopharyngeal swab specimens and should not be used as a sole basis for treatment. Nasal washings and aspirates are unacceptable for Xpert Xpress SARS-CoV-2/FLU/RSV testing.  Fact Sheet for Patients: bloggercourse.com  Fact Sheet for Healthcare Providers: seriousbroker.it  This test is not yet approved or cleared by the [place]  FDA and has been authorized for detection and/or diagnosis of SARS-CoV-2 by FDA under an Emergency Use Authorization (EUA). This EUA will remain in effect (meaning this test can be used) for the duration of the COVID-19 declaration under Section 564(b)(1) of the Act, 21 U.S.C. section 360bbb-3(b)(1), unless the authorization is terminated or revoked.     Resp Syncytial Virus by PCR NEGATIVE NEGATIVE Final    Comment: (NOTE) Fact Sheet for Patients: bloggercourse.com  Fact Sheet for Healthcare Providers: seriousbroker.it  This test is not yet approved or cleared by the [place]  FDA and has been authorized for detection and/or diagnosis of SARS-CoV-2 by FDA under an Emergency Use Authorization (EUA). This EUA will remain in effect (meaning this test can be used) for the duration of the COVID-19 declaration under Section 564(b)(1) of  the Act, 21 U.S.C. section 360bbb-3(b)(1), unless the authorization is terminated or revoked.  Performed at [facility], [address] [place], [place] [address]   MRSA Next Gen by PCR, Nasal     Status: None   Collection Time: 03/08/24  1:10 PM   Specimen: Nasal Mucosa; Nasal Swab  Result Value Ref Range Status   MRSA by PCR Next Gen NOT DETECTED NOT DETECTED Final    Comment: (NOTE) The GeneXpert MRSA Assay (FDA approved for NASAL specimens only), is one component of a comprehensive MRSA colonization surveillance program. It is not intended to diagnose MRSA infection nor to guide or monitor treatment for MRSA  infections. Test performance is not FDA approved in patients less than 47 years old. Performed at [facility], [address] [place], [place] [address]       Radiology Studies: US  THORACENTESIS LEFT ASP PLEURAL SPACE W/IMG GUIDE Result Date: 03/10/2024 INDICATION: 142230 Pleural effusion 142230 Patient history of congestive heart failure, chronic left pleural effusion. Request for diagnostic and therapeutic left thoracentesis. EXAM: ULTRASOUND GUIDED LEFT THORACENTESIS MEDICATIONS: 6 mL 1% lidocaine  COMPLICATIONS: None immediate. PROCEDURE: An ultrasound guided thoracentesis was thoroughly discussed with the patient and questions answered. The benefits, risks, alternatives and complications were also discussed. The patient understands and wishes to proceed with the procedure. Written consent was obtained. Ultrasound was performed to localize and mark an adequate pocket of fluid in the left chest. The area was then prepped and draped in the normal sterile fashion. 1% Lidocaine  was used for local anesthesia. Under ultrasound guidance a 6 Fr Safe-T-Centesis catheter was introduced. Thoracentesis was performed. The catheter was removed and a dressing applied. FINDINGS: A total of approximately 65 mL of clear yellow fluid was removed. Despite multiple catheter manipulations there was no  further flow of fluid and the procedure was aborted. Residual pleural effusion remains on post procedure ultrasound. samples were sent to the laboratory as requested by the clinical team. IMPRESSION: Successful ultrasound guided diagnostic LEFT thoracentesis yielding 65 mL of pleural fluid. Performed by Clotilda Hesselbach, PA-C under supervision of Thom Hall, MD Electronically Signed   By: Thom Hall M.D.   On: 03/10/2024 14:25   DG Chest Portable 1 View Result Date: 03/10/2024 CLINICAL DATA:  Status post thoracentesis. EXAM: PORTABLE CHEST 1 VIEW COMPARISON:  03/08/2024. FINDINGS: Moderate-sized left pleural effusion with persistent left basilar atelectasis. Improved aeration of the left upper to mid lung zone. No pneumothorax identified. Cardiomegaly with persistent pulmonary vascular congestion and mild interstitial edema. No other significant change. IMPRESSION: 1. Moderate-sized left pleural effusion with persistent left basilar atelectasis. Improved aeration of the left upper to mid lung zone. No pneumothorax identified. 2. Cardiomegaly with persistent pulmonary vascular congestion and mild interstitial edema. Electronically Signed   By: Harrietta Sherry M.D.   On: 03/10/2024 12:39   Scheduled Meds:  arformoterol   15 mcg Nebulization BID   And   umeclidinium bromide   1 puff Inhalation Daily   bisoprolol   5 mg Oral Daily   Chlorhexidine  Gluconate Cloth  6 each Topical Q0600   cycloSPORINE   1 drop Both Eyes BID   DULoxetine   60 mg Oral BID   furosemide   60 mg Intravenous Q12H   heparin   5,000 Units Subcutaneous Q8H   insulin  aspart  0-15 Units Subcutaneous TID WC   insulin  aspart  0-5 Units Subcutaneous QHS   insulin  glargine  10 Units Subcutaneous QHS   lactulose   20 g Oral BID   levothyroxine   100 mcg Oral QAC breakfast   pantoprazole   40 mg Oral BID   rosuvastatin   5 mg Oral Daily   sacubitril -valsartan   1 tablet Oral BID   sodium chloride  flush  3 mL Intravenous Q12H   sodium  chloride flush  3 mL Intravenous Q12H   spironolactone   25 mg Oral Daily   ursodiol   300 mg Oral BID   Continuous Infusions:     LOS: 2 days   Rendall Carwin M.D on 03/10/2024 at 6:08 PM  Go to [email or web address] - for contact info  [facility] - Office  [phone]  If 7PM-7AM, please contact night-coverage [email or web address] 03/10/2024, 6:08 PM    "

## 2024-03-10 NOTE — TOC Progression Note (Signed)
 Transition of Care [facility]) - Progression Note    Patient Details  Name: Stephanie [name] MRN: [id number] Date of Birth: [birth year]  [facility] CM/SW Contact  Hoy DELENA Bigness, LCSW Phone Number: 03/10/2024, 10:31 AM  Clinical Narrative:    HHPT arranged with [facility]. HH order will need to be placed prior to discharge.    Expected Discharge Plan: Home w Home Health Services Barriers to Discharge: Continued Medical Work up               Expected Discharge Plan and Services In-house Referral: Clinical Social Work     Living arrangements for the past 2 months: Single Family Home                             HH Agency: Other - See comment Producer, Television/film/video) Date HH Agency Contacted: 03/09/24 Time HH Agency Contacted: 1029 Representative spoke with at [facility] Agency: [facility]   Social Drivers of Health (SDOH) Interventions SDOH Screenings   Food Insecurity: No Food Insecurity (03/08/2024)  Housing: Unknown (03/08/2024)  Transportation Needs: No Transportation Needs (03/08/2024)  Utilities: Not At Risk (03/08/2024)  Depression (PHQ2-9): Low Risk (09/14/2023)  Social Connections: Unknown (03/08/2024)  Recent Concern: Social Connections - Moderately Isolated (02/16/2024)  Tobacco Use: Medium Risk (03/08/2024)    Readmission Risk Interventions    03/09/2024   10:25 AM 02/17/2024   11:46 AM 12/27/2023   10:08 AM  Readmission Risk Prevention Plan  Transportation Screening Complete Complete Complete  PCP or Specialist Appt within 3-5 Days  Complete   HRI or Home Care Consult  Complete   Social Work Consult for Recovery Care Planning/Counseling  Complete   Palliative Care Screening  Not Applicable   Medication Review Oceanographer) Complete Complete Complete  PCP or Specialist appointment within 3-5 days of discharge   Complete  HRI or Home Care Consult Complete  Complete  SW Recovery Care/Counseling Consult Complete  Complete  Palliative Care Screening Not Applicable   Complete  Skilled Nursing Facility Not Applicable  Complete

## 2024-03-11 DIAGNOSIS — N1832 Chronic kidney disease, stage 3b: Secondary | ICD-10-CM

## 2024-03-11 LAB — RENAL FUNCTION PANEL
Albumin: 2.6 g/dL — ABNORMAL LOW (ref 3.5–5.0)
Anion gap: 6 (ref 5–15)
BUN: 38 mg/dL — ABNORMAL HIGH (ref 8–23)
CO2: 29 mmol/L (ref 22–32)
Calcium: 8.1 mg/dL — ABNORMAL LOW (ref 8.9–10.3)
Chloride: 104 mmol/L (ref 98–111)
Creatinine, Ser: 1.46 mg/dL — ABNORMAL HIGH (ref 0.44–1.00)
GFR, Estimated: 38 mL/min — ABNORMAL LOW
Glucose, Bld: 121 mg/dL — ABNORMAL HIGH (ref 70–99)
Phosphorus: 2.1 mg/dL — ABNORMAL LOW (ref 2.5–4.6)
Potassium: 3.2 mmol/L — ABNORMAL LOW (ref 3.5–5.1)
Sodium: 139 mmol/L (ref 135–145)

## 2024-03-11 LAB — GLUCOSE, CAPILLARY
Glucose-Capillary: 108 mg/dL — ABNORMAL HIGH (ref 70–99)
Glucose-Capillary: 121 mg/dL — ABNORMAL HIGH (ref 70–99)

## 2024-03-11 MED ORDER — TORSEMIDE 20 MG PO TABS: 60.0000 mg | ORAL_TABLET | Freq: Every day | ORAL | 11 refills | Status: AC

## 2024-03-11 MED ORDER — SACUBITRIL-VALSARTAN 24-26 MG PO TABS: 1.0000 | ORAL_TABLET | Freq: Two times a day (BID) | ORAL | 5 refills | Status: AC

## 2024-03-11 MED ORDER — POTASSIUM CHLORIDE CRYS ER 20 MEQ PO TBCR
40.0000 meq | EXTENDED_RELEASE_TABLET | Freq: Once | ORAL | Status: AC
  Administered 2024-03-11: 40 meq via ORAL
  Filled 2024-03-11: qty 2

## 2024-03-11 MED ORDER — PANTOPRAZOLE SODIUM 40 MG PO TBEC: 40.0000 mg | DELAYED_RELEASE_TABLET | Freq: Every day | ORAL | 5 refills | Status: AC

## 2024-03-11 MED ORDER — ALBUTEROL SULFATE HFA 108 (90 BASE) MCG/ACT IN AERS: 1.0000 | INHALATION_SPRAY | Freq: Four times a day (QID) | RESPIRATORY_TRACT | 3 refills | Status: AC | PRN

## 2024-03-11 MED ORDER — ROSUVASTATIN CALCIUM 5 MG PO TABS: 5.0000 mg | ORAL_TABLET | Freq: Every day | ORAL | 11 refills | Status: AC

## 2024-03-11 MED ORDER — DULOXETINE HCL 60 MG PO CPEP: 60.0000 mg | ORAL_CAPSULE | Freq: Two times a day (BID) | ORAL | 5 refills | Status: AC

## 2024-03-11 MED ORDER — STIOLTO RESPIMAT 2.5-2.5 MCG/ACT IN AERS: 2.0000 | INHALATION_SPRAY | Freq: Every day | RESPIRATORY_TRACT | 5 refills | Status: AC

## 2024-03-11 MED ORDER — ALBUTEROL SULFATE (2.5 MG/3ML) 0.083% IN NEBU: 2.5000 mg | INHALATION_SOLUTION | Freq: Four times a day (QID) | RESPIRATORY_TRACT | 10 refills | Status: AC | PRN

## 2024-03-11 MED ORDER — ACETAMINOPHEN 325 MG PO TABS: 650.0000 mg | ORAL_TABLET | Freq: Four times a day (QID) | ORAL | Status: AC | PRN

## 2024-03-11 MED ORDER — SPIRONOLACTONE 25 MG PO TABS: 25.0000 mg | ORAL_TABLET | Freq: Every day | ORAL | 5 refills | Status: AC

## 2024-03-11 MED ORDER — BISOPROLOL FUMARATE 5 MG PO TABS: 5.0000 mg | ORAL_TABLET | Freq: Every day | ORAL | 5 refills | Status: AC
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
## 2024-03-11 NOTE — Progress Notes (Signed)
 Patient states that family members plan to be here around 1300 to take her home.

## 2024-03-11 NOTE — Discharge Summary (Signed)
 "                                                                                 Stephanie [name], is a 74 y.o. female  DOB 1950/06/21  MRN [id number].  Admission date:  03/08/2024  Admitting Physician  Rendall Carwin, [name]  Discharge Date:  03/11/2024   Primary [name]  Pll[facility], [facility]  Recommendations for primary care physician for things to follow:  1)Very Low-salt diet advised---Less than 2 gm of Sodium per day advised----ok to use Mrs DASH salt substitute instead of Salt 2)Weigh yourself daily, call if you gain more than 3 pounds in 1 day or more than 5 pounds in 1 week as your diuretic medications may need to be adjusted 3)Avoid ibuprofen /Advil /Aleve/Motrin Josefine Powders/Naproxen/BC powders/Meloxicam/Diclofenac /Indomethacin and other Nonsteroidal anti-inflammatory medications as these will make you more likely to bleed and can cause stomach ulcers, can also cause Kidney problems.  4)you need oxygen  at home at 2 L via nasal cannula continuously while awake and while asleep--- smoking or having open fires around oxygen  can cause fire, significant injury and death 5)Repeat BMP Blood Test in 5 to 7 days advised  Admission Diagnosis  Acute exacerbation of CHF (congestive heart failure) (HCC) [I50.9]   Discharge Diagnosis  Acute exacerbation of CHF (congestive heart failure) (HCC) [I50.9]    Principal Problem:   Acute exacerbation of CHF (congestive heart failure) (HCC) Active Problems:   GERD (gastroesophageal reflux disease)   Hypothyroidism   Atrial fibrillation with RVR (HCC)   Chronic kidney disease, stage 3b (HCC)   Chronic respiratory failure with hypoxia (HCC)   Type 2 diabetes mellitus with hyperlipidemia (HCC)   MGUS (monoclonal gammopathy of unknown significance)   PAF (paroxysmal atrial fibrillation) (HCC)      Past Medical History:  Diagnosis Date   Anemia    Arthritis    Asthma    COPD (chronic obstructive pulmonary disease) (HCC)    COVID-19    Deep vein thrombosis  (DVT) of both lower extremities (HCC) 06/27/2015   Fibromyalgia    GERD (gastroesophageal reflux disease)    H/O hiatal hernia    Hypercholesteremia    Hypertension    Hyperthyroidism    IBS (irritable bowel syndrome)    Inappropriate sinus tachycardia    Inner ear disease    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   Type 2 diabetes mellitus (HCC)     Past Surgical History:  Procedure Laterality Date   ABDOMINAL HYSTERECTOMY  partial   CARPAL TUNNEL RELEASE Right 1991   CATARACT EXTRACTION W/PHACO Right 05/08/2013   Procedure: CATARACT EXTRACTION PHACO AND INTRAOCULAR LENS PLACEMENT (IOC);  Surgeon: Cherene Mania, [name];  Location: [facility];  Service: Ophthalmology;  Laterality: Right;  CDE 10.31   CATARACT EXTRACTION W/PHACO Left 08/17/2013   Procedure: CATARACT EXTRACTION PHACO AND INTRAOCULAR LENS PLACEMENT (IOC);  Surgeon: Cherene Mania, [name];  Location: [facility];  Service: Ophthalmology;  Laterality: Left;  CDE:9.03   CHOLECYSTECTOMY  1971   COLONOSCOPY WITH PROPOFOL  N/A 01/06/2016   Dr. Shaaron: diverticulosis    DENTAL SURGERY     ESOPHAGEAL BRUSHING  08/29/2019   Procedure: ESOPHAGEAL BRUSHING;  Surgeon: Shaaron,  Lamar HERO, [name];  Location: [facility];  Service: Endoscopy;;   ESOPHAGOGASTRODUODENOSCOPY (EGD) WITH PROPOFOL  N/A 01/06/2016   Dr. Shaaron: normal s/p empiric dilation    ESOPHAGOGASTRODUODENOSCOPY (EGD) WITH PROPOFOL  N/A 08/29/2019   esophageal plaques vs medication residue adherent to tubular esophagus s/p KOH brushing and dilation. Medium-sized hiatal hernia. + for candida. Diflucan .    EYE SURGERY     IR BONE MARROW BIOPSY & ASPIRATION  03/31/2022   MALONEY DILATION N/A 01/06/2016   Procedure: AGAPITO DILATION;  Surgeon: Lamar HERO Shaaron, [name];  Location: [facility];  Service: Endoscopy;  Laterality: N/A;   MALONEY DILATION N/A 08/29/2019   Procedure: AGAPITO DILATION;  Surgeon: Shaaron Lamar HERO, [name];  Location: [facility];  Service: Endoscopy;  Laterality: N/A;    REVERSE SHOULDER ARTHROPLASTY Right 08/06/2017   Procedure: RIGHT REVERSE SHOULDER ARTHROPLASTY;  Surgeon: Kay Kemps, [name];  Location: [facility] OR;  Service: Orthopedics;  Laterality: Right;   RIGHT HEART CATH N/A 11/26/2023   Procedure: RIGHT HEART CATH;  Surgeon: Cherrie Toribio SAUNDERS, [name];  Location: [facility];  Service: Cardiovascular;  Laterality: N/A;   WRIST GANGLION EXCISION Left        HPI  from the history and physical done on the day of admission:  HPI: Stephanie [name] is a 74 y.o. female with medical history significant for  MGUS, chronic HFrEF (EF 25 to 30 %),  history of DVT (was taken off Eliquis  prior to admission due to history of GI bleed), CVA, CKD 3B, COPD/asthma, chronic hypoxia on 3-4 L [place] at baseline, fibromyalgia, GERD, type 2 diabetes, hypertension, hyperlipidemia, hypothyroidism, obesity presents to the ED with less than 24 hours of worsening dyspnea with increased oxygen  requirement, URI symptoms including sore throat and productive cough -No chest pains, no leg swelling no leg pain- -patient was recently admitted from 02/16/2024 to 02/25/2024 and treated for CHF exacerbation with finding of left-sided pleural effusion she underwent thoracentesis on 02/18/2024 - Today patient required nonrebreather bag initially-oxygen  was eventually weaned down after IV Lasix    COVID flu and RSV negative - Admission ABG without CO2 retention, respiratory alkalosis noted - proBNP elevated at greater than 35,000 on it was 12,000 on 02/21/2024 - Hgb 11.7 similar to priors, platelets 320 WBC 10.4 -- Sodium is 138, potassium 4.0, creatinine 1.17 improved from 1.3 about 12 days ago - LFTs remarkable for alk phos of 532 AST is only 40 and ALT is 21 T. bili up to 5.4 -- Chest x-ray consistent with cardiomegaly with mild pulmonary venous congestion and associated interstitial edema and moderate left-sided pleural effusion   Review of Systems: As mentioned in the history of present illness. All  other systems reviewed and are negative.     Hospital Course:    Assessment and Plan: Brief Narrative:   74 y.o. female with medical history significant for  MGUS, chronic HFrEF (EF 25 to 30 %),  history of DVT (was taken off Eliquis  prior to admission due to history of GI bleed), CVA, CKD 3B, COPD/asthma, chronic hypoxia on 3-4 L [place] at baseline, fibromyalgia, GERD, type 2 diabetes, hypertension, hyperlipidemia, hypothyroidism, obesity readmitted on 03/08/2024 after being recently discharged on 02/25/2024 with congestive heart failure and pleural effusion concerns     -Assessment and Plan: 1)Acute on chronic HFrEF/systolic dysfunction exacerbation Clinical exam and imaging studies and labs consistent with CHF exacerbation. --patient was recently admitted from 02/16/2024 to 02/25/2024 and treated for CHF exacerbation with finding of left-sided pleural effusion she  underwent thoracentesis on 02/18/2024 Last 2D echo done on 11/17/2023 revealed LVEF 35 to 40% -Continue Entresto  and Aldactone  -Fluid input and output monitoring and daily weights--weight scale appears to be inaccurate at this time -- Repeat chest x-ray on 03/10/2024 after attempted/mostly unsuccessful thoracentesis noted -- Much improved after IV diuresis -No significant dyspnea -Oxygen  requirement also improved   2)Paroxysmal A-fib--- continue bisoprolol  for rate control  - Patient was previously taken off  anticoagulation due to GI bleed   3)Lt Sided Pleural Effusion--- -- Mostly unsuccessful attempt left-sided thoracentesis on 03/10/2024 only 65 mL of pleural fluid removed --Fluid analysis consistent with transudate--most likely due to CHF - 4)DM2 -Hemoglobin A1c 6.6 on 11/23/2023 reflecting good diabetic control PTA -Continue PTA regimen  5)CKD 3B  creatinine was  1.3 about 2 weeks ago -Creatinine currently around 1.46 - renally adjust medications, avoid nephrotoxic agents / dehydration  / hypotension   GERD -Continue  Protonix    Hyperlipidemia Resume home Crestor    Hypothyroidism C/n  levothyroxine    Class1 Obesity- -Low calorie diet, portion control and increase physical activity discussed with patient after resolution of acute illness -Body mass index is 34.19 kg/m.   History of DVT in 2017 Was taken off Eliquis  prior to admission due to history of GI bleed    MGUS Outpatient follow-up with medical oncology.   Right upper lobe pulmonary nodule measuring 7 mm Outpatient follow-up.   Advanced hepatic fibrosis Chronic hepatic fibrosis -Continue lactulose    Discharge Condition: Stable   Follow UP   Contact information for follow-up providers     Pllc, [facility]. Schedule an appointment as soon as possible for a visit in 5 day(s).   Why: Repeat BMP Blood Test Contact information: [address] [place] [address] [phone]              Contact information for after-discharge care     Home Medical Care     [facility]) .   Service: Home Health Services Contact information: [address] [place]  [address] [phone]                     Diet and Activity recommendation:  As advised  Discharge Instructions    Discharge Instructions     Call [name] for:  difficulty breathing, headache or visual disturbances   Complete by: As directed    Call [name] for:  persistant dizziness or light-headedness   Complete by: As directed    Call [name] for:  persistant nausea and vomiting   Complete by: As directed    Call [name] for:  temperature >100.4   Complete by: As directed    Diet - low sodium heart healthy   Complete by: As directed    Diet Carb Modified   Complete by: As directed    Discharge instructions   Complete by: As directed    1)Very Low-salt diet advised---Less than 2 gm of Sodium per day advised----ok to use Mrs DASH salt substitute instead of Salt 2)Weigh yourself daily, call if you gain more than  3 pounds in 1 day or more than 5 pounds in 1 week as your diuretic medications may need to be adjusted 3)Avoid ibuprofen /Advil /Aleve/Motrin Josefine Powders/Naproxen/BC powders/Meloxicam/Diclofenac /Indomethacin and other Nonsteroidal anti-inflammatory medications as these will make you more likely to bleed and can cause stomach ulcers, can also cause Kidney problems.  4)you need oxygen  at home at 2 L via nasal cannula continuously while awake and  while asleep--- smoking or having open fires around oxygen  can cause fire, significant injury and death 5)Repeat BMP Blood Test in 5 to 7 days advised   Increase activity slowly   Complete by: As directed        Discharge Medications     Allergies as of 03/11/2024       Reactions   Tetracyclines & Related Anaphylaxis, Dermatitis, Rash   Banana Hives, Nausea And Vomiting   Jardiance  [empagliflozin ] Other (See Comments)   Yeast infections, fatigue   Penicillins Rash        Medication List     STOP taking these medications    potassium chloride  SA 20 MEQ tablet Commonly known as: KLOR-CON  M       TAKE these medications    Accu-Chek FastClix Lancets Misc Use 1 lancet to check blood glucose four (4) times daily as directed by provider.   Accu-Chek Guide Control Liqd See admin instructions.   Accu-Chek Guide Test test strip Generic drug: glucose blood Use 1 test strip four(4) times daily to monitor blood glucose as directed by provider.   acetaminophen  325 MG tablet Commonly known as: TYLENOL  Take 2 tablets (650 mg total) by mouth every 6 (six) hours as needed for mild pain (pain score 1-3) or fever (or Fever >/= 101).   albuterol  108 (90 Base) MCG/ACT inhaler Commonly known as: VENTOLIN  HFA Inhale 1-2 puffs into the lungs every 6 (six) hours as needed for wheezing or shortness of breath.   albuterol  (2.5 MG/3ML) 0.083% nebulizer solution Commonly known as: PROVENTIL  Take 3 mLs (2.5 mg total) by nebulization every 6 (six) hours  as needed for wheezing or shortness of breath.   Alcohol Pads 70 % Pads SMARTSIG:Pledget(s) Topical 4 Times Daily   bisoprolol  5 MG tablet Commonly known as: ZEBETA  Take 1 tablet (5 mg total) by mouth daily.   cholecalciferol  25 MCG (1000 UNIT) tablet Commonly known as: VITAMIN D3 Take 3,000 Units by mouth daily.   DULoxetine  60 MG capsule Commonly known as: CYMBALTA  Take 1 capsule (60 mg total) by mouth 2 (two) times daily.   Easy Comfort Pen Needles 31G X 5 MM Misc Generic drug: Insulin  Pen Needle INJECT IN SULIN EVERY DAY AS DIRECTED   Easy Mini Eject Lancing Device Misc 4 (four) times daily.   FreeStyle Libre 3 Plus Sensor Misc USE TO MONITOR GLUCOSE CONTINUOUSLY AS DIRECTED. CHANGE SENSOR EVERY 15 DAYS.   FreeStyle Libre 3 Reader Espiridion USE TO MONITOR GLUCOSE CONTINUOUSLY AS DIRECTED   HumaLOG  KwikPen 100 UNIT/ML KwikPen Generic drug: insulin  lispro Inject 2-8 Units into the skin 3 (three) times daily.   InterDry 10x144 Shee Change pad daily or if soiled   lactulose  10 GM/15ML solution Commonly known as: CHRONULAC  Take 30 mLs (20 g total) by mouth 2 (two) times daily.   levothyroxine  100 MCG tablet Commonly known as: SYNTHROID  Take 100 mcg by mouth daily before breakfast. What changed: Another medication with the same name was removed. Continue taking this medication, and follow the directions you see here.   lidocaine  5 % Commonly known as: Lidoderm  Place 1 patch onto the skin daily. Remove & Discard patch within 12 hours or as directed by [name] What changed:  when to take this reasons to take this   methocarbamol  500 MG tablet Commonly known as: ROBAXIN  Take 500 mg by mouth every 8 (eight) hours as needed for muscle spasms.   pantoprazole  40 MG tablet Commonly known as: PROTONIX  Take 1 tablet (40  mg total) by mouth daily. What changed: when to take this   Repatha  SureClick 140 MG/ML Soaj Generic drug: Evolocumab  Inject 140 mg into the skin every 14  (fourteen) days.   Restasis  0.05 % ophthalmic emulsion Generic drug: cycloSPORINE  Place 1 drop into both eyes 2 (two) times daily.   rosuvastatin  5 MG tablet Commonly known as: Crestor  Take 1 tablet (5 mg total) by mouth daily.   sacubitril -valsartan  24-26 MG Commonly known as: ENTRESTO  Take 1 tablet by mouth 2 (two) times daily. Start taking on: March 12, 2024   spironolactone  25 MG tablet Commonly known as: ALDACTONE  Take 1 tablet (25 mg total) by mouth daily.   Stiolto Respimat  2.5-2.5 MCG/ACT Aers Generic drug: Tiotropium Bromide-Olodaterol Inhale 2 puffs into the lungs daily.   tirzepatide  12.5 MG/0.5ML Pen Commonly known as: MOUNJARO  Inject 12.5 mg into the skin once a week.   torsemide  20 MG tablet Commonly known as: DEMADEX  Take 3 tablets (60 mg total) by mouth daily. What changed: how much to take   traMADol  50 MG tablet Commonly known as: ULTRAM  Take 50 mg by mouth 3 (three) times daily as needed for severe pain (pain score 7-10).   Tresiba  FlexTouch 100 UNIT/ML FlexTouch Pen Generic drug: insulin  degludec Inject 15 Units into the skin at bedtime.   ursodiol  300 MG capsule Commonly known as: ACTIGALL  Take 300 mg by mouth 2 (two) times daily.               Durable Medical Equipment  (From admission, onward)           Start     Ordered   03/11/24 1137  For home use only DME Hospital bed  Once       Comments: Congestive heart failure/generalized weakness and deconditioning  Question Answer Comment  Length of Need Lifetime   The above medical condition requires: Patient requires the ability to reposition frequently   Head must be elevated greater than: 30 degrees   Bed type Semi-electric   Support Surface: Gel Overlay      03/11/24 1137           Major procedures and Radiology Reports - PLEASE review detailed and final reports for all details, in brief -   US  THORACENTESIS LEFT ASP PLEURAL SPACE W/IMG GUIDE Result Date:  03/10/2024 INDICATION: 142230 Pleural effusion 142230 Patient history of congestive heart failure, chronic left pleural effusion. Request for diagnostic and therapeutic left thoracentesis. EXAM: ULTRASOUND GUIDED LEFT THORACENTESIS MEDICATIONS: 6 mL 1% lidocaine  COMPLICATIONS: None immediate. PROCEDURE: An ultrasound guided thoracentesis was thoroughly discussed with the patient and questions answered. The benefits, risks, alternatives and complications were also discussed. The patient understands and wishes to proceed with the procedure. Written consent was obtained. Ultrasound was performed to localize and mark an adequate pocket of fluid in the left chest. The area was then prepped and draped in the normal sterile fashion. 1% Lidocaine  was used for local anesthesia. Under ultrasound guidance a 6 Fr Safe-T-Centesis catheter was introduced. Thoracentesis was performed. The catheter was removed and a dressing applied. FINDINGS: A total of approximately 65 mL of clear yellow fluid was removed. Despite multiple catheter manipulations there was no further flow of fluid and the procedure was aborted. Residual pleural effusion remains on post procedure ultrasound. samples were sent to the laboratory as requested by the clinical team. IMPRESSION: Successful ultrasound guided diagnostic LEFT thoracentesis yielding 65 mL of pleural fluid. Performed by Stephanie Hesselbach, PA-C under supervision of Stephanie Hall, [name]  Electronically Signed   By: Stephanie [name] M.D.   On: 03/10/2024 14:25   DG Chest Portable 1 View Result Date: 03/10/2024 CLINICAL DATA:  Status post thoracentesis. EXAM: PORTABLE CHEST 1 VIEW COMPARISON:  03/08/2024. FINDINGS: Moderate-sized left pleural effusion with persistent left basilar atelectasis. Improved aeration of the left upper to mid lung zone. No pneumothorax identified. Cardiomegaly with persistent pulmonary vascular congestion and mild interstitial edema. No other significant change. IMPRESSION: 1.  Moderate-sized left pleural effusion with persistent left basilar atelectasis. Improved aeration of the left upper to mid lung zone. No pneumothorax identified. 2. Cardiomegaly with persistent pulmonary vascular congestion and mild interstitial edema. Electronically Signed   By: Stephanie [name] M.D.   On: 03/10/2024 12:39   DG Chest Port 1 View Result Date: 03/08/2024 CLINICAL DATA:  Shortness of breath. EXAM: PORTABLE CHEST 1 VIEW COMPARISON:  February 20, 2024 FINDINGS: The cardiac silhouette is moderately enlarged and is unchanged in size. There is mild prominence of the central pulmonary vasculature. Mild, diffuse, increased interstitial lung markings are present. Mild to moderate severity left basilar atelectasis and/or infiltrate is seen. A moderate size left-sided pleural effusion is noted. No pneumothorax is identified. Multilevel degenerative changes are present throughout the thoracic spine. IMPRESSION: 1. Cardiomegaly with mild pulmonary vascular congestion and associated interstitial edema. 2. Mild to moderate severity left basilar atelectasis and/or infiltrate. 3. Moderate size left-sided pleural effusion. Electronically Signed   By: Stephanie [name] M.D.   On: 03/08/2024 12:10   DG CHEST PORT 1 VIEW Result Date: 02/20/2024 EXAM: 1 VIEW(S) XRAY OF THE CHEST 02/20/2024 06:21:04 AM COMPARISON: 02/18/2024 CLINICAL HISTORY: Acute heart failure (HCC); Pulmonary edema FINDINGS: LUNGS AND PLEURA: Decreased pulmonary edema. Hazy opacity overlying left lung persistent with silhouetting off of the left apex of the heart and left hemidiaphragm. Left carotid apex not visualized due to overlying lung disease. No pneumothorax. HEART AND MEDIASTINUM: Stable cardiomegaly. Unchanged cardiomediastinal silhouette. 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 IMPRESSION: 1. Decreased pulmonary edema. 2. Persistent opacity of the left lower lung zone. The finding may represent a  combination of pleural effusion, atelectasis, and lung disease. Electronically signed by: Stephanie [name] 02/20/2024 08:39 AM EST RP Workstation: [id number]   ECHOCARDIOGRAM COMPLETE Result Date: 02/18/2024    ECHOCARDIOGRAM REPORT   Patient Name:   Stephanie [name] Trickel Date of Exam: 02/17/2024 Medical Rec #:  [id number]   Height:       68.0 in Accession #:    [id number]  Weight:       238.1 lb Date of Birth:  Jul 18, 1950   BSA:          2.201 m Patient Age:    73 years    BP:           160/93 mmHg Patient Gender: F           HR:           100 bpm. Exam Location:  [facility] Procedure: 2D Echo, Cardiac Doppler, Color Doppler and Intracardiac            Opacification Agent (Both Spectral and Color Flow Doppler were            utilized during procedure). Indications:    CHF- Acute Systolic l50.21  History:        Patient has prior history of Echocardiogram examinations, most                 recent 11/17/2023. CHF, TIA  and Stroke, Arrythmias:Atrial                 Fibrillation; Risk Factors:Hypertension, Diabetes, Dyslipidemia                 and Former Smoker.  Sonographer:    Stephanie [name] Referring Phys: [id number] Stephanie [name] IMPRESSIONS  1. No LV thrombus by Definity . Left ventricular ejection fraction, by estimation, is 25 to 30%. The left ventricle has severely decreased function. The left ventricle demonstrates regional wall motion abnormalities (see scoring diagram/findings for description). Left ventricular diastolic function could not be evaluated. There is the interventricular septum is flattened in systole and diastole, consistent with right ventricular pressure and volume overload.  2. Right ventricular systolic function is severely reduced. The right ventricular size is moderately enlarged. There is moderately elevated pulmonary artery systolic pressure. The estimated right ventricular systolic pressure is 46.4 mmHg.  3. Left atrial size was mildly dilated.  4. Right atrial size was mildly dilated.  5. A small  pericardial effusion is present. The pericardial effusion is circumferential. Large pleural effusion in the left lateral region.  6. The mitral valve is normal in structure. Mild to moderate mitral valve regurgitation. No evidence of mitral stenosis.  7. The tricuspid valve is abnormal. Tricuspid valve regurgitation is moderate to severe.  8. The aortic valve has an indeterminant number of cusps. Aortic valve regurgitation is not visualized. No aortic stenosis is present. Aortic valve mean gradient measures 3.0 mmHg.  9. The inferior vena cava is dilated in size with <50% respiratory variability, suggesting right atrial pressure of 15 mmHg. FINDINGS  Left Ventricle: No LV thrombus by Definity . Left ventricular ejection fraction, by estimation, is 25 to 30%. The left ventricle has severely decreased function. The left ventricle demonstrates regional wall motion abnormalities. Definity  contrast agent was given IV to delineate the left ventricular endocardial borders. Strain was performed and the global longitudinal strain is indeterminate. The left ventricular internal cavity size was normal in size. There is no left ventricular hypertrophy. The interventricular septum is flattened in systole and diastole, consistent with right ventricular pressure and volume overload. Left ventricular diastolic function could not be evaluated due to atrial fibrillation. Left ventricular diastolic function could  not be evaluated.  LV Wall Scoring: The entire lateral wall and anterior septum are akinetic. The inferior septum and apex are hypokinetic. Right Ventricle: The right ventricular size is moderately enlarged. No increase in right ventricular wall thickness. Right ventricular systolic function is severely reduced. There is moderately elevated pulmonary artery systolic pressure. The tricuspid regurgitant velocity is 2.80 m/s, and with an assumed right atrial pressure of 15 mmHg, the estimated right ventricular systolic pressure  is 46.4 mmHg. Left Atrium: Left atrial size was mildly dilated. Right Atrium: Right atrial size was mildly dilated. Pericardium: A small pericardial effusion is present. The pericardial effusion is circumferential. Mitral Valve: The mitral valve is normal in structure. Mild to moderate mitral valve regurgitation. No evidence of mitral valve stenosis. Tricuspid Valve: The tricuspid valve is abnormal. Tricuspid valve regurgitation is moderate to severe. No evidence of tricuspid stenosis. Aortic Valve: The aortic valve has an indeterminant number of cusps. Aortic valve regurgitation is not visualized. No aortic stenosis is present. Aortic valve mean gradient measures 3.0 mmHg. Aortic valve peak gradient measures 4.8 mmHg. Aortic valve area, by VTI measures 2.45 cm. Pulmonic Valve: The pulmonic valve was not well visualized. Pulmonic valve regurgitation is trivial. No evidence of pulmonic stenosis. Aorta: The aortic  root is normal in size and structure. Venous: The inferior vena cava is dilated in size with less than 50% respiratory variability, suggesting right atrial pressure of 15 mmHg. IAS/Shunts: No atrial level shunt detected by color flow Doppler. Additional Comments: 3D was performed not requiring image post processing on an independent workstation and was indeterminate. There is a large pleural effusion in the left lateral region.  LEFT VENTRICLE PLAX 2D LVIDd:         4.70 cm      Diastology LVIDs:         3.90 cm      LV e' medial:    5.22 cm/s LV PW:         1.00 cm      LV E/e' medial:  23.3 LV IVS:        1.00 cm      LV e' lateral:   8.95 cm/s LVOT diam:     1.90 cm      LV E/e' lateral: 13.6 LV SV:         50 LV SV Index:   23 LVOT Area:     2.84 cm  LV Volumes (MOD) LV vol d, MOD A2C: 113.0 ml LV vol d, MOD A4C: 110.0 ml LV vol s, MOD A2C: 69.0 ml LV vol s, MOD A4C: 77.3 ml LV SV MOD A2C:     44.0 ml LV SV MOD A4C:     110.0 ml LV SV MOD BP:      36.9 ml RIGHT VENTRICLE RV S prime:     9.70 cm/s TAPSE  (M-mode): 2.0 cm LEFT ATRIUM             Index        RIGHT ATRIUM           Index LA diam:        4.10 cm 1.86 cm/m   RA Area:     22.40 cm LA Vol (A2C):   62.4 ml 28.35 ml/m  RA Volume:   69.80 ml  31.71 ml/m LA Vol (A4C):   75.2 ml 34.17 ml/m LA Biplane Vol: 74.5 ml 33.85 ml/m  AORTIC VALVE AV Area (Vmax):    2.40 cm AV Area (Vmean):   2.19 cm AV Area (VTI):     2.45 cm AV Vmax:           109.00 cm/s AV Vmean:          77.100 cm/s AV VTI:            0.206 m AV Peak Grad:      4.8 mmHg AV Mean Grad:      3.0 mmHg LVOT Vmax:         92.33 cm/s LVOT Vmean:        59.533 cm/s LVOT VTI:          0.178 m LVOT/AV VTI ratio: 0.86  AORTA Ao Root diam: 3.20 cm MITRAL VALVE                TRICUSPID VALVE MV Area (PHT): 5.43 cm     TR Peak grad:   31.4 mmHg MV Decel Time: 140 msec     TR Vmax:        280.00 cm/s MR Peak grad: 64.6 mmHg MR Mean grad: 45.0 mmHg     SHUNTS MR Vmax:      402.00 cm/s   Systemic VTI:  0.18 m MR Vmean:  320.0 cm/s    Systemic Diam: 1.90 cm MV E velocity: 121.67 cm/s MV A velocity: 98.00 cm/s MV E/A ratio:  1.24 Stephanie [name] Electronically signed by Stephanie [name] Signature Date/Time: 02/18/2024/3:47:41 PM    Final    DG Chest 1 View Result Date: 02/18/2024 CLINICAL DATA:  Post thoracentesis. EXAM: CHEST  1 VIEW COMPARISON:  02/16/2024 FINDINGS: Diminished left pleural effusion post thoracentesis. No visible pneumothorax. Persistent opacity at the left lung base. Stable cardiomegaly. Generalized increase in bilateral perihilar opacities which may represent edema. IMPRESSION: 1. Diminished left pleural effusion post thoracentesis. No visible pneumothorax. 2. Persistent opacity at the left lung base. 3. Generalized increase in bilateral perihilar opacities which may represent edema. Electronically Signed   By: Andrea Gasman M.D.   On: 02/18/2024 11:52   US  THORACENTESIS ASP PLEURAL SPACE W/IMG GUIDE Result Date: 02/18/2024 INDICATION: Patient with history of  congestive heart failure, found to have new left pleural effusion. Request for diagnostic and therapeutic thoracentesis. EXAM: ULTRASOUND GUIDED LEFT THORACENTESIS MEDICATIONS: 8 mL 1% lidocaine  COMPLICATIONS: None immediate. PROCEDURE: An ultrasound guided thoracentesis was thoroughly discussed with the patient and questions answered. The benefits, risks, alternatives and complications were also discussed. The patient understands and wishes to proceed with the procedure. Written consent was obtained. Ultrasound was performed to localize and mark an adequate pocket of fluid in the left chest. The area was then prepped and draped in the normal sterile fashion. 1% Lidocaine  was used for local anesthesia. Under ultrasound guidance a 6 Fr Safe-T-Centesis catheter was introduced. Thoracentesis was performed. The catheter was removed and a dressing applied. FINDINGS: A total of approximately 100 mL of fluid was removed. Samples were sent to the laboratory as requested by the clinical team. IMPRESSION: Successful ultrasound guided left thoracentesis yielding 100 mL of pleural fluid. Performed by Stephanie Hesselbach, PA-C Electronically Signed   By: Ester Sides M.D.   On: 02/18/2024 10:58   DG Chest Port 1 View Result Date: 02/16/2024 EXAM: 1 VIEW(S) XRAY OF THE CHEST 02/16/2024 06:03:00 PM COMPARISON: 12/21/2023 CLINICAL HISTORY: sob FINDINGS: LUNGS AND PLEURA: Left lung base airspace opacity. Moderate left pleural effusion. Mild pulmonary edema. There is a right upper lobe pulmonary nodule measuring 7 mm. No pneumothorax. HEART AND MEDIASTINUM: Cardiomegaly, stable. BONES AND SOFT TISSUES: Right shoulder arthroplasty noted. IMPRESSION: 1. Left lung base airspace opacity and moderate left pleural effusion. 2. Mild pulmonary edema. 3. Stable cardiomegaly. 4. Right upper lobe pulmonary nodule measuring 7 mm. This can be further evaluated with non-emergent CT. Electronically signed by: Greig Pique [name] 02/16/2024 07:17 PM  EST RP Workstation: [id number]   Micro Results  Recent Results (from the past 240 hours)  Resp panel by RT-PCR (RSV, Flu A&B, Covid) Anterior Nasal Swab     Status: None   Collection Time: 03/08/24 11:56 AM   Specimen: Anterior Nasal Swab  Result Value Ref Range Status   SARS Coronavirus 2 by RT PCR NEGATIVE NEGATIVE Final    Comment: (NOTE) SARS-CoV-2 target nucleic acids are NOT DETECTED.  The SARS-CoV-2 RNA is generally detectable in upper respiratory specimens during the acute phase of infection. The lowest concentration of SARS-CoV-2 viral copies this assay can detect is 138 copies/mL. A negative result does not preclude SARS-Cov-2 infection and should not be used as the sole basis for treatment or other patient management decisions. A negative result may occur with  improper specimen collection/handling, submission of specimen other than nasopharyngeal swab, presence of viral mutation(s) within the areas targeted  by this assay, and inadequate number of viral copies(<138 copies/mL). A negative result must be combined with clinical observations, patient history, and epidemiological information. The expected result is Negative.  Fact Sheet for Patients:  bloggercourse.com  Fact Sheet for Healthcare Providers:  seriousbroker.it  This test is no t yet approved or cleared by the [place]  FDA and  has been authorized for detection and/or diagnosis of SARS-CoV-2 by FDA under an Emergency Use Authorization (EUA). This EUA will remain  in effect (meaning this test can be used) for the duration of the COVID-19 declaration under Section 564(b)(1) of the Act, 21 U.S.C.section 360bbb-3(b)(1), unless the authorization is terminated  or revoked sooner.       Influenza A by PCR NEGATIVE NEGATIVE Final   Influenza B by PCR NEGATIVE NEGATIVE Final    Comment: (NOTE) The Xpert Xpress SARS-CoV-2/FLU/RSV plus assay is intended as an  aid in the diagnosis of influenza from Nasopharyngeal swab specimens and should not be used as a sole basis for treatment. Nasal washings and aspirates are unacceptable for Xpert Xpress SARS-CoV-2/FLU/RSV testing.  Fact Sheet for Patients: bloggercourse.com  Fact Sheet for Healthcare Providers: seriousbroker.it  This test is not yet approved or cleared by the [place]  FDA and has been authorized for detection and/or diagnosis of SARS-CoV-2 by FDA under an Emergency Use Authorization (EUA). This EUA will remain in effect (meaning this test can be used) for the duration of the COVID-19 declaration under Section 564(b)(1) of the Act, 21 U.S.C. section 360bbb-3(b)(1), unless the authorization is terminated or revoked.     Resp Syncytial Virus by PCR NEGATIVE NEGATIVE Final    Comment: (NOTE) Fact Sheet for Patients: bloggercourse.com  Fact Sheet for Healthcare Providers: seriousbroker.it  This test is not yet approved or cleared by the [place]  FDA and has been authorized for detection and/or diagnosis of SARS-CoV-2 by FDA under an Emergency Use Authorization (EUA). This EUA will remain in effect (meaning this test can be used) for the duration of the COVID-19 declaration under Section 564(b)(1) of the Act, 21 U.S.C. section 360bbb-3(b)(1), unless the authorization is terminated or revoked.  Performed at [facility], [address] [place], [place] [address]   MRSA Next Gen by PCR, Nasal     Status: None   Collection Time: 03/08/24  1:10 PM   Specimen: Nasal Mucosa; Nasal Swab  Result Value Ref Range Status   MRSA by PCR Next Gen NOT DETECTED NOT DETECTED Final    Comment: (NOTE) The GeneXpert MRSA Assay (FDA approved for NASAL specimens only), is one component of a comprehensive MRSA colonization surveillance program. It is not intended to diagnose MRSA infection  nor to guide or monitor treatment for MRSA infections. Test performance is not FDA approved in patients less than 71 years old. Performed at [facility], [address], [place], [place] [address]     Today   Subjective    Stephanie [name] today has no new complaints - Dyspnea has improved significantly - Diuresed really well - Oxygen  requirement has improved significantly - PTA patient was chronically on 2 L of oxygen  via nasal cannula - Actually did okay on room air with O2 sats of 93% at rest after diuresis -      Patient has been seen and examined prior to discharge   Objective   Blood pressure 119/74, pulse 85, temperature (!) 97.5 F (36.4 C), temperature source Oral, resp. rate (!) 23, height 5' 8 (1.727 m), weight 100.9 kg, SpO2 100%.  Intake/Output Summary (Last 24 hours) at 03/11/2024 1154 Last data filed at 03/11/2024 0850 Gross per 24 hour  Intake 480 ml  Output 300 ml  Net 180 ml    Exam Gen:- Awake Alert, no acute distress  HEENT:- [place].AT, No sclera icterus Neck-Supple Neck,No JVD,.  Lungs-improved air movement especially on the left, no wheezing or rales CV- S1, S2 normal, regular Abd-  +ve B.Sounds, Abd Soft, No tenderness,    Extremity/Skin:- No  edema,   good pulses Psych-affect is appropriate, oriented x3 Neuro-no new focal deficits, no tremors    Data Review   CBC w Diff:  Lab Results  Component Value Date   WBC 10.4 03/08/2024   HGB 11.7 (L) 03/08/2024   HCT 38.6 03/08/2024   PLT 320 03/08/2024   LYMPHOPCT 13 02/16/2024   MONOPCT 7 02/16/2024   EOSPCT 1 02/16/2024   BASOPCT 0 02/16/2024  CMP:  Lab Results  Component Value Date   NA 139 03/11/2024   K 3.2 (L) 03/11/2024   CL 104 03/11/2024   CO2 29 03/11/2024   BUN 38 (H) 03/11/2024   CREATININE 1.46 (H) 03/11/2024   CREATININE 0.95 02/11/2023   PROT 9.0 (H) 03/08/2024   PROT 8.1 12/16/2015   ALBUMIN  2.6 (L) 03/11/2024   ALBUMIN  3.7 12/16/2015   BILITOT 5.4 (H) 03/08/2024    BILITOT 0.2 12/16/2015   ALKPHOS 532 (H) 03/08/2024   AST 40 03/08/2024   ALT 21 03/08/2024   Total Discharge time is about 33 minutes  Rendall Carwin M.D on 03/11/2024 at 11:54 AM  Go to [email or web address] -  for contact info  [facility] - Office  [phone]   "

## 2024-03-11 NOTE — TOC Transition Note (Signed)
 Transition of Care [facility]) - Discharge Note   Patient Details  Name: Stephanie [name] MRN: [id number] Date of Birth: 01-May-1950  Transition of Care [facility] CM/SW Contact:  Ronnald MARLA Sil, RN Phone Number: 03/11/2024, 2:34 PM   Clinical Narrative:    Patient discussed during Progression rounds this morning with emphasis on patient's medical readiness to discharge today.  CM conducted chart review and confirmed discharge plan for patient to transition home with [facility] for PT, in addition to Dr Maximiano request to assist patient with arranging Hospital Bed for home.    Dr Rendall informed CM the patient already had a Hospital Bed orderered but something broke on it during the delivery in the home and it was subsequently removed and the agency never returned or contacted the patient again.  CM met patient at bedside to verify additional details about the Hospital bed arrangements, patient stated she could not recall the name of the company but confirmed it was set-up by [facility] when she discharged on Thanksgiving Day 2025.  CM attempted to contact patient's Dtr GLENWOOD Spates - call immedicately reached voice recording stating mailbox is full, CM then contacted SNF liaison - Debbie to inquire about the hospital bed order and DME provider, she indicated she would review the patient's file and contact CM with findings.   In the meantime, CM forwarded DC Summary and HH order to [facility], and notified liaison -  Cory via text of patient's DC.  Patient confirmed her family will provide transportation home.  CM team will continue to follow along and assist with finalizing arrangements for Hospital Bed at home.   Final next level of care: Home w Home Health Services Barriers to Discharge: No Barriers Identified  Patient Goals and CMS Choice Patient states their goals for this hospitalization and ongoing recovery are:: Return Home with [facility]   Choice offered to / list presented to : Patient [facility] ownership interest in [facility].provided to::  (n/a)   Discharge Plan and Services Additional resources added to the After Visit Summary for   In-house Referral: Clinical Social Work      DME Arranged: Hospital bed HH Arranged: PT [facility] Agency: [facility] Date [facility] Agency Contacted: 03/11/24 Time HH Agency Contacted: [phone] Representative spoke with at [facility] Agency: Darleene via Text  Social Drivers of Health (SDOH) Interventions SDOH Screenings   Food Insecurity: No Food Insecurity (03/08/2024)  Housing: Unknown (03/08/2024)  Transportation Needs: No Transportation Needs (03/08/2024)  Utilities: Not At Risk (03/08/2024)  Depression (PHQ2-9): Low Risk (09/14/2023)  Social Connections: Unknown (03/08/2024)  Recent Concern: Social Connections - Moderately Isolated (02/16/2024)  Tobacco Use: Medium Risk (03/08/2024)     Readmission Risk Interventions    03/09/2024   10:25 AM 02/17/2024   11:46 AM 12/27/2023   10:08 AM  Readmission Risk Prevention Plan  Transportation Screening Complete Complete Complete  PCP or Specialist Appt within 3-5 Days  Complete   HRI or Home Care Consult  Complete   Social Work Consult for Recovery Care Planning/Counseling  Complete   Palliative Care Screening  Not Applicable   Medication Review Oceanographer) Complete Complete Complete  PCP or Specialist appointment within 3-5 days of discharge   Complete  HRI or Home Care Consult Complete  Complete  SW Recovery Care/Counseling Consult Complete  Complete  Palliative Care Screening Not Applicable  Complete  Skilled Nursing Facility Not Applicable  Complete

## 2024-03-11 NOTE — Progress Notes (Signed)
 Patient discharged home. IV removed and site intact. Patient states that she is satisfied that all belongings were returned. Discharge instructions reviewed with the patient and family; all questions were answered.

## 2024-03-12 LAB — LD, BODY FLUID (OTHER): LD, Body Fluid: 79 [IU]/L

## 2024-03-16 ENCOUNTER — Inpatient Hospital Stay

## 2024-03-23 ENCOUNTER — Inpatient Hospital Stay: Admitting: Oncology

## 2024-10-12 ENCOUNTER — Ambulatory Visit: Admitting: Adult Health
